# Patient Record
Sex: Male | Born: 1935 | Race: White | Hispanic: No | Smoking: Former smoker
Health system: Southern US, Community
[De-identification: ages and names within clinical notes are randomized; demographics above are authoritative.]

## PROBLEM LIST (undated history)

## (undated) DIAGNOSIS — M5442 Lumbago with sciatica, left side: Secondary | ICD-10-CM

## (undated) DIAGNOSIS — I82890 Acute embolism and thrombosis of other specified veins: Secondary | ICD-10-CM

## (undated) DIAGNOSIS — K219 Gastro-esophageal reflux disease without esophagitis: Secondary | ICD-10-CM

## (undated) DIAGNOSIS — I639 Cerebral infarction, unspecified: Secondary | ICD-10-CM

## (undated) DIAGNOSIS — C4491 Basal cell carcinoma of skin, unspecified: Secondary | ICD-10-CM

## (undated) DIAGNOSIS — I499 Cardiac arrhythmia, unspecified: Secondary | ICD-10-CM

## (undated) DIAGNOSIS — R918 Other nonspecific abnormal finding of lung field: Secondary | ICD-10-CM

## (undated) DIAGNOSIS — Z87442 Personal history of urinary calculi: Secondary | ICD-10-CM

## (undated) DIAGNOSIS — E785 Hyperlipidemia, unspecified: Secondary | ICD-10-CM

## (undated) DIAGNOSIS — N189 Chronic kidney disease, unspecified: Secondary | ICD-10-CM

## (undated) DIAGNOSIS — N400 Enlarged prostate without lower urinary tract symptoms: Secondary | ICD-10-CM

## (undated) DIAGNOSIS — I10 Essential (primary) hypertension: Secondary | ICD-10-CM

## (undated) DIAGNOSIS — M1990 Unspecified osteoarthritis, unspecified site: Secondary | ICD-10-CM

## (undated) DIAGNOSIS — I8290 Acute embolism and thrombosis of unspecified vein: Secondary | ICD-10-CM

## (undated) DIAGNOSIS — T7840XA Allergy, unspecified, initial encounter: Secondary | ICD-10-CM

## (undated) HISTORY — DX: Essential (primary) hypertension: I10

## (undated) HISTORY — PX: CATARACT EXTRACTION, BILATERAL: SHX1313

## (undated) HISTORY — PX: CYSTOSCOPY WITH INSERTION OF UROLIFT: SHX6678

## (undated) HISTORY — PX: SPINE SURGERY: SHX786

## (undated) HISTORY — PX: APPENDECTOMY: SHX54

## (undated) HISTORY — PX: KIDNEY STONE SURGERY: SHX686

## (undated) HISTORY — DX: Allergy, unspecified, initial encounter: T78.40XA

## (undated) HISTORY — DX: Other nonspecific abnormal finding of lung field: R91.8

## (undated) HISTORY — DX: Chronic kidney disease, unspecified: N18.9

## (undated) HISTORY — DX: Hyperlipidemia, unspecified: E78.5

## (undated) HISTORY — DX: Gastro-esophageal reflux disease without esophagitis: K21.9

## (undated) HISTORY — DX: Lumbago with sciatica, left side: M54.42

## (undated) HISTORY — PX: KNEE SURGERY: SHX244

## (undated) HISTORY — DX: Basal cell carcinoma of skin, unspecified: C44.91

## (undated) HISTORY — DX: Acute embolism and thrombosis of unspecified vein: I82.90

---
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
## 2005-09-22 ENCOUNTER — Ambulatory Visit: Payer: Self-pay | Admitting: Family Medicine

## 2007-04-13 ENCOUNTER — Encounter: Payer: Self-pay | Admitting: Family Medicine

## 2007-08-10 ENCOUNTER — Ambulatory Visit: Payer: Self-pay | Admitting: Specialist

## 2007-08-17 ENCOUNTER — Inpatient Hospital Stay (HOSPITAL_COMMUNITY): Admission: RE | Admit: 2007-08-17 | Discharge: 2007-08-18 | Payer: Self-pay | Admitting: Neurosurgery

## 2007-12-02 LAB — HM COLONOSCOPY: HM COLON: NORMAL

## 2007-12-23 ENCOUNTER — Ambulatory Visit: Payer: Self-pay | Admitting: Neurosurgery

## 2008-01-03 ENCOUNTER — Inpatient Hospital Stay (HOSPITAL_COMMUNITY): Admission: RE | Admit: 2008-01-03 | Discharge: 2008-01-04 | Payer: Self-pay | Admitting: Neurosurgery

## 2008-05-22 ENCOUNTER — Ambulatory Visit: Payer: Self-pay | Admitting: Family Medicine

## 2008-08-09 ENCOUNTER — Encounter: Payer: Self-pay | Admitting: Family Medicine

## 2008-08-31 ENCOUNTER — Encounter: Payer: Self-pay | Admitting: Family Medicine

## 2009-03-27 ENCOUNTER — Ambulatory Visit: Payer: Self-pay | Admitting: Family Medicine

## 2009-05-28 ENCOUNTER — Ambulatory Visit: Payer: Self-pay | Admitting: Family Medicine

## 2009-10-22 ENCOUNTER — Inpatient Hospital Stay: Payer: Self-pay | Admitting: Student

## 2010-12-12 ENCOUNTER — Ambulatory Visit: Payer: Self-pay | Admitting: Family Medicine

## 2011-04-15 NOTE — Op Note (Signed)
NAMEPHELIX, FUDALA NO.:  [id number]   MEDICAL RECORD NO.:  [id number]          PATIENT TYPE:  INP   LOCATION:  2899                         FACILITY:  [facility]   PHYSICIAN:  Kathaleen Maser. Pool, M.D.    DATE OF BIRTH:  [birth year]   DATE OF PROCEDURE:  08/17/2007  DATE OF DISCHARGE:                               OPERATIVE REPORT   SERVICE:  Neurosurgery.   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   PROCEDURE:  Right L4-5 laminotomy and microdiskectomy.   SURGEON:  Kathaleen Maser. Pool, M.D.   ASSISTANT:  Reinaldo Meeker, M.D.   ANESTHESIA:  General orotracheal.   INDICATION:  Mr. Radell is a 77 year old male with history of back and  bilateral lower extremity pain, failing all conservative management.  Workup demonstrates evidence of a very large central L4-5 disk  herniation.  This disk in biased slightly more towards the right side.  We discussed options for our management including the possibly of  undergoing a right-sided L4-5 laminotomy and microdiskectomy in hopes of  improving his symptoms.  The patient is aware of the risks and benefits  and wishes to proceed with surgery.   OPERATIVE NOTE:  The patient was brought to the operating room and  placed on the operative table in supine position.  After adequate level  of anesthesia was achieved, the patient was positioned prone onto the  Wilson frame and appropriately padded.  The patient's lumbar region was  prepped and sterilely prepped sterilely.  A 10 blade was used to make a  linear skin incision overlying the L4-5 interspace.  This was carried  down sharply in the midline.  A subperiosteal dissection was then  performed, exposing the lamina and facet joints at L4 and L5.  A deep  self-retaining retractor was placed and intraoperative x-rays taken and  the level was  confirmed.  A laminotomy was then performed using a high-  speed drill and Kerrison rongeurs to remove the inferior aspect of the  lamina of L4, medial aspect of the L4-5 facet joint and superior rim of  the L5-1 lamina.  The ligamentum flavum was elevated and resected in  piecemeal fashion using Kerrison rongeurs.  The underlying thecal sac  and exiting L5 nerve were identified.  A microscope was brought into the  operative field for identification of the right-sided L5 nerve root and  microdissection of the underlying disk herniation.  Epidural venous  plexus was coagulated and cut.  Thecal sac and the L5 nerve were gently  mobilized and retracted towards the midline.  Disk herniation was  readily apparent.  This was then incised with a 15 blade in a  rectangular fashion.  A wide disk space clean-out was achieved using  pituitary rongeurs, upbiting rongeurs and Epstein curettes.  All  elements of the disk herniation were completely removed.  All loose  fragments of degenerative disk material was removed from the interspace.  At this point, a very thorough diskectomy had  been performed.  There was  no evidence of injury to the thecal sac or nerve roots.  A blunt probe  was passed easily throughout the spinal canal without evidence of any  residual compression.  There was no evidence of injury to the thecal sac  or nerve roots.  The wound was then irrigated with antibiotic solution.  Gelfoam was placed topically for hemostasis, which was found to be good.  Microscope and retractor system were removed.  Hemostasis in the muscle was achieved with electrocautery.  The wound  was then closed in layers with Vicryl suture.  Steri-Strips and sterile  dressing were applied.  There were no apparent complications.  The  patient tolerated the procedure well and he returns to the recovery room  postoperatively.           ______________________________  Kathaleen Maser Pool, M.D.     HAP/MEDQ  D:   08/17/2007  T:  08/18/2007  Job:  [id number]

## 2011-04-15 NOTE — Op Note (Signed)
NAMEKIEON, LAWHORN NO.:  [id number]   MEDICAL RECORD NO.:  [id number]          PATIENT TYPE:  OIB   LOCATION:  3039                         FACILITY:  [facility]   PHYSICIAN:  Kathaleen Maser. Pool, M.D.    DATE OF BIRTH:  Aug 06, 1926   DATE OF PROCEDURE:  01/03/2008  DATE OF DISCHARGE:  01/04/2008                               OPERATIVE REPORT   PREOPERATIVE DIAGNOSES:  Recurrent central L4-5 herniated nucleus  pulposus with radiculopathy.   POSTOPERATIVE DIAGNOSES:  Recurrent central L4-5 herniated nucleus  pulposus with radiculopathy.   OPERATION PERFORMED:  Re-exploration of L4-5 laminotomy with left-sided  L4-5 microdiskectomy.   SURGEON:  Kathaleen Maser. Pool, M.D.   ASSISTANT:  Donalee Citrin, M.D.   ANESTHESIA:  General endotracheal.   INDICATIONS FOR PROCEDURE:  Mr. Gorter is a 77 year old male status post  previous right-sided L4-5 laminotomy and microdiskectomy. He presents  now with worsening back and left lower extremity pain.  Work-up  demonstrates evidence of a large left paracentral recurrent disk  herniation at L4-5.  Patient has been counseled as to his options.  He  has decided to proceed with a left-sided L4-5 redo laminotomy and  microdiskectomy in hopes of improving symptoms.   DESCRIPTION OF PROCEDURE:  The patient was taken to the operating room  and placed on the table in the supine position.  After adequate level of  anesthesia was achieved, the patient was positioned prone onto a Wilson  frame and appropriately padded.  The patient's lumbar region was prepped  and draped sterilely.  A 10 blade was used to make a linear skin  incision overlying the L4-5 interspace.  This was carried down sharply  in the midline.  A subperiosteal dissection was then performed exposing  the lamina and facet joints of L4 and L4 on the left side.  Deep self-  retaining retractor was placed.  Intraoperative x-ray was taken, the  level was confirmed.  The laminotomy was then  performed using high speed  drill and Kerrison rongeurs to remove the inferior edge of the lamina at  L4, medial aspect of the L4-5 facet joint and the superior rim at L5  __________  ligamentum flavum was then  elevated and resected in  piecemeal fashion using Kerrison rongeurs.  Underlying thecal sac and  exiting L5 nerve root were identified.  The microscope was brought into  the field and used for microdissection of the left-sided L5 nerve root  and underlying disk herniation.  Epidural venous plexus was coagulated  and cut.  Thecal sac and L5 nerve root were gently mobilized, retracted  toward the midline.  Disk space and disk herniation were apparent.  They  were then incised with a 15 blade in rectangular fashion.  A wide disk  space cleanout was then achieved pituitary rongeurs and upward angled  pituitary rongeurs and Epstein curets.  All elements of the recurrent  disk herniation completely resection.  All loose or obviously  degenerated disk material was removed from the interspace.  Afterwards a  very thorough diskectomy had been performed.  The wound  was then  irrigated with antibiotic solution.  Gelfoam was placed topically for  hemostasis which was found to be good.  There was no evidence of injury  to thecal sac or nerve roots.  There was no evidence of any residual  compression upon the thecal sac and nerve roots.  Gelfoam placed  topically for hemostasis which was found to be good.  The wound was then  in typical fashion.  Steri-Strips and sterile dressing were applied.  There were no apparent complications.  The patient tolerated the  procedure well and returned to the recovery room postoperatively.           ______________________________  Kathaleen Maser Pool, M.D.     HAP/MEDQ  D:  01/03/2008  T:  01/04/2008  Job:  [id number]

## 2011-08-22 LAB — CBC
HCT: 49.8
MCV: 91
Platelets: 261
RDW: 12.7

## 2011-08-22 LAB — DIFFERENTIAL
Basophils Absolute: 0
Eosinophils Absolute: 0.4
Eosinophils Relative: 4
Lymphs Abs: 2.3

## 2011-08-22 LAB — BASIC METABOLIC PANEL
BUN: 14
Chloride: 107
Glucose, Bld: 107 — ABNORMAL HIGH
Potassium: 4.2

## 2011-08-22 LAB — TYPE AND SCREEN: ABO/RH(D): O POS

## 2011-09-11 LAB — TYPE AND SCREEN: Antibody Screen: NEGATIVE

## 2011-09-11 LAB — BASIC METABOLIC PANEL
CO2: 23
Calcium: 9.4
Glucose, Bld: 121 — ABNORMAL HIGH
Sodium: 138

## 2011-09-11 LAB — CBC
Hemoglobin: 16.1
MCHC: 34.8
MCV: 92
RDW: 12.1

## 2012-06-08 ENCOUNTER — Ambulatory Visit: Payer: Self-pay | Admitting: Family Medicine

## 2012-12-10 ENCOUNTER — Ambulatory Visit: Payer: Self-pay | Admitting: Family Medicine

## 2013-11-18 DIAGNOSIS — N200 Calculus of kidney: Secondary | ICD-10-CM | POA: Insufficient documentation

## 2013-11-22 ENCOUNTER — Ambulatory Visit: Payer: Self-pay | Admitting: Family Medicine

## 2013-11-23 ENCOUNTER — Ambulatory Visit: Payer: Self-pay | Admitting: Family Medicine

## 2013-11-26 ENCOUNTER — Inpatient Hospital Stay: Payer: Self-pay | Admitting: Urology

## 2013-11-26 ENCOUNTER — Ambulatory Visit: Payer: Self-pay | Admitting: Urology

## 2013-11-26 LAB — CBC WITH DIFFERENTIAL/PLATELET
Basophil #: 0.1 10*3/uL (ref 0.0–0.1)
Basophil %: 0.7 %
Eosinophil #: 0.4 10*3/uL (ref 0.0–0.7)
Eosinophil #: 0.1 10*3/uL (ref 0.0–0.7)
Eosinophil %: 3.9 %
Eosinophil %: 1.5 %
HCT: 44.7 % (ref 40.0–52.0)
HGB: 15.2 g/dL (ref 13.0–18.0)
Lymphocyte #: 1.4 10*3/uL (ref 1.0–3.6)
Lymphocyte %: 19.6 %
MCH: 31.3 pg (ref 26.0–34.0)
MCH: 31 pg (ref 26.0–34.0)
MCHC: 33.9 g/dL (ref 32.0–36.0)
MCV: 92 fL (ref 80–100)
MCV: 91 fL (ref 80–100)
Monocyte #: 1.2 x10 3/mm — ABNORMAL HIGH (ref 0.2–1.0)
Monocyte #: 0.9 x10 3/mm (ref 0.2–1.0)
Monocyte %: 11.2 %
Monocyte %: 9.3 %
Neutrophil #: 6.7 10*3/uL — ABNORMAL HIGH (ref 1.4–6.5)
Platelet: 260 10*3/uL (ref 150–440)
RBC: 4.85 10*6/uL (ref 4.40–5.90)
RDW: 13.3 % (ref 11.5–14.5)
RDW: 13.3 % (ref 11.5–14.5)
WBC: 10.3 10*3/uL (ref 3.8–10.6)

## 2013-11-26 LAB — COMPREHENSIVE METABOLIC PANEL
Albumin: 3.3 g/dL — ABNORMAL LOW (ref 3.4–5.0)
Bilirubin,Total: 0.5 mg/dL (ref 0.2–1.0)
Calcium, Total: 8.8 mg/dL (ref 8.5–10.1)
Chloride: 110 mmol/L — ABNORMAL HIGH (ref 98–107)
Co2: 23 mmol/L (ref 21–32)
Creatinine: 2.56 mg/dL — ABNORMAL HIGH (ref 0.60–1.30)
EGFR (Non-African Amer.): 22 — ABNORMAL LOW
Osmolality: 290 (ref 275–301)
Total Protein: 7.2 g/dL (ref 6.4–8.2)

## 2013-11-26 LAB — BASIC METABOLIC PANEL
BUN: 37 mg/dL — ABNORMAL HIGH (ref 7–18)
Calcium, Total: 8.7 mg/dL (ref 8.5–10.1)
Co2: 24 mmol/L (ref 21–32)
Creatinine: 1.95 mg/dL — ABNORMAL HIGH (ref 0.60–1.30)
EGFR (African American): 35 — ABNORMAL LOW
EGFR (Non-African Amer.): 30 — ABNORMAL LOW
Glucose: 110 mg/dL — ABNORMAL HIGH (ref 65–99)
Sodium: 140 mmol/L (ref 136–145)

## 2013-11-26 LAB — URINALYSIS, COMPLETE
Bacteria: NONE SEEN
Bilirubin,UR: NEGATIVE
Glucose,UR: NEGATIVE mg/dL (ref 0–75)
Leukocyte Esterase: NEGATIVE
Squamous Epithelial: 1

## 2013-11-26 LAB — LIPASE, BLOOD: Lipase: 516 U/L — ABNORMAL HIGH (ref 73–393)

## 2013-11-27 LAB — BASIC METABOLIC PANEL
Calcium, Total: 8.2 mg/dL — ABNORMAL LOW (ref 8.5–10.1)
Co2: 23 mmol/L (ref 21–32)
EGFR (African American): 60 — ABNORMAL LOW
Glucose: 101 mg/dL — ABNORMAL HIGH (ref 65–99)

## 2013-11-30 ENCOUNTER — Ambulatory Visit: Payer: Self-pay | Admitting: Urology

## 2013-11-30 DIAGNOSIS — I10 Essential (primary) hypertension: Secondary | ICD-10-CM

## 2013-12-05 ENCOUNTER — Ambulatory Visit: Payer: Self-pay | Admitting: Urology

## 2014-01-02 ENCOUNTER — Ambulatory Visit: Payer: Self-pay | Admitting: Family Medicine

## 2014-05-31 LAB — PSA: PSA: NORMAL

## 2014-10-19 LAB — LIPID PANEL
Cholesterol: 206 mg/dL — AB (ref 0–200)
HDL: 30 mg/dL — AB (ref 35–70)
LDL Cholesterol: 116 mg/dL

## 2015-01-25 ENCOUNTER — Ambulatory Visit: Payer: Self-pay | Admitting: Family Medicine

## 2015-03-23 NOTE — Discharge Summary (Signed)
PATIENT NAME:  Jose Castro, Jose Castro MR#:  [id number] DATE OF BIRTH:  Sep 12, 1926  DATE OF ADMISSION:  11/26/2013 DATE OF DISCHARGE:  11/27/2013  ADMITTING DIAGNOSES: 1.  Left ureteral stone. 2.  Mild left hydronephrosis.   3.  Acute kidney injury.    DISCHARGE DIAGNOSES: 1.  Left ureteral stone. 2.  Mild left hydronephrosis.   3.  Acute kidney injury.    SECONDARY DIAGNOSES: 1.  Hypertension. 2.  Spinal stenosis.  PROCEDURES PERFORMED:  Cystoscopy with left retrograde pyelogram with left 6-French x 26 cm ureteral stent placement.    CONSULTATIONS:  None.    HISTORY OF PRESENT ILLNESS:  Please refer to dictated history and physical.    HOSPITAL COURSE:  The patient was admitted to the urology service for acute kidney injury with a creatinine up to 2.5 and a left proximal ureteral stone with mild hydronephrosis.  The patient was placed on IV fluids and was taken to the Operating Room on December 27th for cystoscopy with left ureteral stent placement.  Retrograde pyelogram revealed a left proximal ureteral stone consistent with the CT scan and mild hydronephrosis.  Stent was placed without difficulty.  By the next day, the patient's creatinine had come down to 1.2.  He had improvement in his pain.  His Foley catheter which had been placed during the operation was removed and he voided without difficulty.  At the time of his discharge, he was tolerating a regular diet, ambulating at his preadmission status and voiding at his preadmission status.    DISCHARGE MEDICATIONS:   1.  Tamsulosin 0.4 mg p.o. every day. 2.  Hydrocodone/acetaminophen 5/325 one tab q.6 hours p.r.n. pain.  DISCHARGE INSTRUCTIONS:   1.  Return to the Emergency Room or call the doctor on call if you have fever greater than 101.5, worsening pain, nausea, vomiting, inability to urinate or any other concerning signs or symptoms.   2.  Please follow up with Dr. Eliberto Ivory by calling his office tomorrow, Monday.   3.  Please hold home  meloxicam in anticipation of future surgery.   4.  Some blood in the urine is normal.  As long as you feel like your are emptying your bladder okay and it is not profusely bleeding, you can monitor the situation.    FOLLOWUP PLANS: 1.  The patient will call Dr. Eliberto Ivory tomorrow, Monday, for followup.   2.  I recommended that the patient follow up with his primary care physician given some elevated blood pressure while in-house as well as some rectal bleeding that came after his enema.  His hemoglobin was within normal limits with limited concern for malignancy but I did discuss this risk with the patient and recommended he follow up with his PCP for further discussion.    ____________________________ Charna Archer, MD jps:cs D: 11/27/2013 12:04:00 ET T: 11/27/2013 20:51:05 ET JOB#: [id number]  cc: Charna Archer, MD, <Dictator> Elwyn Lade [facility] MD ELECTRONICALLY SIGNED 11/28/2013 3:14

## 2015-03-23 NOTE — Op Note (Signed)
PATIENT NAME:  Jose Castro, Jose Castro MR#:  [id number] DATE OF BIRTH:  Apr 24, 1926  DATE OF PROCEDURE:  11/26/2013  PREOPERATIVE DIAGNOSES: 1.  Acute kidney injury. 2.  Left ureteral stone. 3.  Left mild hydronephrosis.  POSTOPERATIVE DIAGNOSES: 1.  Acute kidney injury. 2.  Left ureteral stone. 3.  Left mild hydronephrosis.  PROCEDURES PERFORMED: 1.  Cystoscopy with left 6 French x 26 cm contour ureteral stent placement. 2.  Left retrograde pyelogram. 3.  Interpretation of urography. 4.  Intraoperative fluoroscopy with total time of less than 1 hour.   ATTENDING SURGEON:  Charna Archer, M.D.   ASSISTANTS: None.  ANESTHESIA: LMA.  SPECIMENS: None.  COMPLICATIONS: None.  DRAINS:  Ureteral stent as above and 18 French Foley catheter draining the bladder.  ESTIMATED BLOOD LOSS:  Minimal.  FINDINGS:  There was mild left hydronephrosis noted with a filling defect consistent with a stone noted at the junction of the mid and proximal ureter on the left. A 6 x 26 contour ureteral stent was placed without difficulty up in the bladder. He did have a moderately trabeculated bladder with cellules throughout. He also had some mild hematuria from prostatic source.  INDICATIONS FOR SURGERY:  Mr. Frick is a pleasant 81 year old male with a history of acute kidney injury and a left ureteral stone. After discussing the risks and benefits of the operation, the patient elected to proceed to the OR for cystoscopy with left retrograde pyelogram and possible stent placement.  DESCRIPTION OF PROCEDURE: After the patient was clearly identified and preop was performed, he was brought to the operating room and placed supine on the operating room table. A procedure time out was called. Preoperative Levaquin was administered and general anesthesia with LMA was then induced. The patient was then placed in the dorsal lithotomy position taking care to pad all pressure points. He was then sterilely prepped and draped in the  usual fashion followed by a presentation time out. With ample lubrication and saline running, a 22. 5 French rigid cystoscope was advanced per urethra into the bladder. In the anterior urethra, I noted no abnormalities. The prostatic urethra had some prominent vessels and there was some mild bleeding here.  The scope was passed in the bladder which was moderately trabeculated and had cellules throughout. It was rather distended as well. I identified the left ureteral orifice and cannulated this with a sensor wire. The open-ended catheter was then passed over this and a retrograde pyelogram was shot which noted mild left hydronephrosis with a stone present, has a filling defect at the junction and mid and proximal left ureter. The wire was then replaced and a 6 French x 26 cm contour ureteral stent was placed over the wire and up into the kidney. Good curl was noted in the kidney on fluoroscopy and under direct visualization in the bladder. The bladder was then drained and an 63 French Foley catheter of the Coude type was placed without difficulty. The patient was then awoken from anesthesia without complication and transferred to recovery room for postoperative care.    ____________________________ Charna Archer, MD jps:NTS D: 11/26/2013 19:29:00 ET T: 11/27/2013 04:33:11 ET JOB#: [id number]  cc: Charna Archer, MD, <Dictator> Elwyn Lade [facility] MD ELECTRONICALLY SIGNED 11/28/2013 3:13

## 2015-03-23 NOTE — H&P (Signed)
Subjective/Chief Complaint kidney stone   History of Present Illness 81 y/o M with h/o left ureteral stone and acute kidney injury seen in the emergency department for evaluation and recommendations regarding this concern. For the last week he has had poor PO intake and n/v - every time he ate food he felt nauseated. Because of this he went to his PCP who checked a u/a and noted blood and protein on the sample. He then got a CT on 12/24 and a 101m proximal left ureteral stone with hydro was noted. He continued to feel poorly, but he attributes this mostly to constipation. He actually came in to the ER because he had some hematochezia after he used a Fleets enema. When his labs were checked his Cr was 2.5. He has mild flank pain. no fevers, chills. + nausea. no hematuria or dysuria.   Past History BPH hypertension on a HRadiographer, therapeutictrial kidney stones spinal stenosis   Past Medical Health Hypertension   Past Med/Surgical Hx:  renal calculi:   Hypertension:   Hemorrhoidectomy:   Appendectomy:   ALLERGIES:  Penicillin: Swelling   Medications Finasteride Lisinopril Vitamin D Meloxicam Unknown liver pill as part of [employer] study   Family and Social History:  Family History Non-Contributory   Social History negative tobacco, negative Illicit drugs   Place of Living Home   Review of Systems:  Fever/Chills No   Cough No   Sputum No   Abdominal Pain Yes   Diarrhea No   Constipation Yes   Nausea/Vomiting Yes   SOB/DOE No   Chest Pain No   Dysuria No   Medications/Allergies Reviewed Medications/Allergies reviewed   Physical Exam:  GEN well developed, no acute distress   HEENT dry oral mucosa   NECK supple  No masses   RESP normal resp effort  clear BS   CARD regular rate   ABD positive Flank Tenderness  no liver/spleen enlargement  normal BS  left CVAT, palpable bladder   GU uncirc'ed penis with no lesions, testicles desc b/l with no masses    EXTR negative cyanosis/clubbing   SKIN No rashes   NEURO cranial nerves intact   PSYCH alert, A+O to time, place, person, good insight   Additional Comments Ct personally reviewed - proximal left ureteral stone with mild hydro, likely some medical renal disease   Lab Results: Hepatic:  27-Dec-14 02:39   Bilirubin, Total 0.5  Alkaline Phosphatase 68 (45-117 NOTE: New Reference Range 10/21/13)  SGPT (ALT) 18  SGOT (AST) 21  Total Protein, Serum 7.2  Albumin, Serum  3.3  Routine Chem:  27-Dec-14 02:39   Glucose, Serum  120  BUN  43  Creatinine (comp)  2.56  Sodium, Serum 139  Potassium, Serum 4.4  Chloride, Serum  110  CO2, Serum 23  Calcium (Total), Serum 8.8  Osmolality (calc) 290  eGFR (African American)  25  eGFR (Non-African American)  22 (eGFR values <614mmin/1.73 m2 may be an indication of chronic kidney disease (CKD). Calculated eGFR is useful in patients with stable renal function. The eGFR calculation will not be reliable in acutely ill patients when serum creatinine is changing rapidly. It is not useful in  patients on dialysis. The eGFR calculation may not be applicable to patients at the low and high extremes of body sizes, pregnant women, and vegetarians.)  Anion Gap  6  Lipase  516 (Result(s) reported on 26 Nov 2013 at 03:09AM.)  Routine UA:  27-Dec-14 02:39   Color (  UA) Yellow  Clarity (UA) Clear  Glucose (UA) Negative  Bilirubin (UA) Negative  Ketones (UA) Negative  Specific Gravity (UA) 1.023  Blood (UA) Negative  pH (UA) 5.0  Protein (UA) 30 mg/dL  Nitrite (UA) Negative  Leukocyte Esterase (UA) Negative (Result(s) reported on 26 Nov 2013 at 03:13AM.)  RBC (UA) 6 /HPF  WBC (UA) 2 /HPF  Bacteria (UA) NONE SEEN  Epithelial Cells (UA) 1 /HPF  Mucous (UA) PRESENT  Hyaline Cast (UA) 1 /LPF (Result(s) reported on 26 Nov 2013 at 03:13AM.)  Routine Hem:  27-Dec-14 02:39   WBC (CBC) 10.3  RBC (CBC) 4.85  Hemoglobin (CBC) 15.2  Hematocrit  (CBC) 44.7  Platelet Count (CBC) 269  MCV 92  MCH 31.3  MCHC 33.9  RDW 13.3  Neutrophil % 64.7  Lymphocyte % 19.6  Monocyte % 11.2  Eosinophil % 3.9  Basophil % 0.6  Neutrophil #  6.7  Lymphocyte # 2.0  Monocyte #  1.2  Eosinophil # 0.4  Basophil # 0.1 (Result(s) reported on 26 Nov 2013 at 03:03AM.)   Radiology Results: LabUnknown:    24-Dec-14 12:10, CT Abdomen Pelvis WO for Stone  PACS Image  CT:  CT Abdomen Pelvis WO for Stone  REASON FOR EXAM:    left flank pain hematuria decreased creatinine eval   for kidney stones  COMMENTS:       PROCEDURE: KCT - KCT ABDOMEN/PELVIS WO (STONE)  - Nov 23 2013 12:10PM     CLINICAL DATA:  Left-sided flank pain    EXAM:  CT ABDOMEN AND PELVIS WITHOUT CONTRAST    TECHNIQUE:  Multidetector CT imaging of the abdomen and pelvis was performed  following the standard protocol without IV contrast.  COMPARISON:  None.    FINDINGS:  Lung bases are free of acute infiltrate or sizable effusion.    The gallbladder is well distended and demonstrates some small tiny  dependent gallstones. The liver and spleen as well as the adrenal  glands and pancreas are within normal limits. The kidneys are well  visualized bilaterally and demonstrate perinephric stranding  bilaterally. Multiple simple and hyperdense cysts are noted within  both kidneys. Mild hydronephrosis is noted on the left secondary to  a 7.5 mm stone in the proximal left ureter. The more distal left  ureter is within normal limits. The right renal collecting system in  ureter are unremarkable. No right-sided calculi are seen. The  appendix is not visualized consistent with prior surgical history.  The bladder is well distended with non-opacified urine. The prostate  is enlarged. Diffuse diverticular change of the colon without  diverticulitis is seen.     IMPRESSION:  Diverticulosis without diverticulitis.    Proximal left ureteral stone with obstructive  change.    Cholelithiasis.    No other focal acute abnormality is noted.      Electronically Signed    By: Inez Catalina M.D.    On: 11/23/2013 12:12         Verified By: Everlene Farrier, M.D.,    Assessment/Admission Diagnosis 81 y/o M with AKI and left ureteral stone. AKI likely multifactorial from dehydration, n/v, NSAIDs, and ureteral stone   Plan - admit to Urology - OR for left ureteral stent placement and RPG - IVF at 125, NPO for now - finasteride as home med, will add tamsulosin in small event that stone passes before OR, hold home ACEI and NSAIDs - dispo: floor   Electronic Signatures: Charna Archer (MD)  (  Signed 27-Dec-14 09:02)  Authored: CHIEF COMPLAINT and HISTORY, PAST MEDICAL/SURGIAL HISTORY, ALLERGIES, OTHER MEDICATIONS, FAMILY AND SOCIAL HISTORY, REVIEW OF SYSTEMS, PHYSICAL EXAM, LABS, Radiology, ASSESSMENT AND PLAN   Last Updated: 27-Dec-14 09:02 by Charna Archer (MD)

## 2015-03-24 NOTE — Op Note (Signed)
PATIENT NAME:  Jose Castro, CAPOZZOLI MR#:  [id number] DATE OF BIRTH:  Dec 04, 1925  DATE OF PROCEDURE:  12/05/2013  PREOPERATIVE DIAGNOSIS: Left ureterolithiasis.   POSTOPERATIVE DIAGNOSIS: Left  ureterolithiasis.   PROCEDURES: 1. Left ureteroscopic ureterolithotomy with holmium laser lithotripsy.  2. Cystoscopy with stent removal.  3. Fluoroscopy.  4. Local anesthesia with intraurethral Anestacon.   INDICATIONS: See the dictated history and physical. After informed consent, the patient requests the above procedures.   OPERATIVE SUMMARY: After adequate general anesthesia had been obtained, the patient was placed into dorsal lithotomy position and the perineum was prepped and draped in the usual fashion. Fluoroscopy confirmed presence of a left ureteral stent. A definite stone could not be identified with fluoroscopy. At this point, the 48 French cystoscope was coupled with the camera and then visually advanced into the bladder. The bladder was thoroughly inspected. Both orifices were identified. A stent was protruding from the left orifice. No bladder tumors were identified. The patient had lateral lobe prostatic hypertrophy. At this point, using an alligator grasping forceps, the ureteral stent was removed. A 0.035 Glidewire was then advanced up the left ureter under fluoroscopic guidance. The Stortz mini ureteroscope was then coupled with the camera and then advanced up the ureter. A 10 mm stone was encountered at the mid ureter. The 300 micron holmium laser fiber was introduced through the scope and passed to the level of stone. The stone was fully disintegrated and fragments less than 1 mm each. At this point, the ureteroscope was removed. Guidewire was removed, 10 mL of viscous Xylocaine was instilled within the urethra and the bladder. The procedure was then terminated and the patient was transferred to the recovery room in stable condition.   ____________________________ Otelia Limes. Yves Dill,  MD mrw:sg D: 12/05/2013 08:33:17 ET T: 12/05/2013 09:25:53 ET JOB#: [id number]  cc: Otelia Limes. Yves Dill, MD, <Dictator> Royston Cowper MD ELECTRONICALLY SIGNED 12/05/2013 12:52

## 2015-03-24 NOTE — H&P (Signed)
PATIENT NAME:  Jose Castro, Jose Castro MR#:  [id number] DATE OF BIRTH:  12-29-1925  DATE OF ADMISSION:  12/05/2013  CHIEF COMPLAINT: Left ureterolithiasis.    HISTORY OF PRESENT ILLNESS: Mr. Zaremba is an 81 year old white male who presented to the Emergency Room December the 27th with acute left renal colic due to a 7 mm obstructing left proximal stone. He was also noted to have an elevated creatinine of 2.5. He underwent emergent cystoscopy with left stent placement at that time. He comes in now for left ureteroscopic ureterolithotomy with holmium laser lithotripsy.   ALLERGIES: PENICILLIN.   CURRENT MEDICATIONS: Include: Finasteride, lisinopril, meloxicam, pantoprazole and tamsulosin.   PAST SURGICAL HISTORY: 1. Appendectomy with tonsillectomy in 1937.  2. Left groin lipoma excision 1987.  3. Hemorrhoidectomy.   PAST AND CURRENT MEDICAL CONDITIONS:  1. Hypertension.  2. Spinal stenosis.  3. BPH.  4. GERD. 5. Arthritis.   REVIEW OF SYSTEMS: The patient denied chest pain, shortness of breath, heart disease, diabetes, stroke or lung disease.   PHYSICAL EXAMINATION: GENERAL: Well-nourished white male in no distress.  HEENT: Sclerae were clear. Pupils were equally round, reactive to light and accommodation. Extraocular movements were intact.   NECK: Supple. No palpable cervical adenopathy.  LUNGS: Clear to auscultation.  CARDIOVASCULAR: Regular rhythm and rate without audible murmurs.  ABDOMEN: Soft, nontender abdomen.  GENITOURINARY AND RECTAL: Deferred.  NEUROMUSCULAR: Alert and oriented x 3.   IMPRESSION: Left ureterolithiasis status post stent placement.   PLAN: Left ureteroscopic ureterolithotomy with holmium laser lithotripsy.     ____________________________ Otelia Limes. Yves Dill, MD mrw:sg D: 11/29/2013 12:58:36 ET T: 11/29/2013 13:13:11 ET JOB#: [id number]  cc: Otelia Limes. Yves Dill, MD, <Dictator>  Royston Cowper MD ELECTRONICALLY SIGNED 12/05/2013 12:52

## 2015-06-21 ENCOUNTER — Encounter: Payer: Self-pay | Admitting: Family Medicine

## 2015-06-21 ENCOUNTER — Ambulatory Visit (INDEPENDENT_AMBULATORY_CARE_PROVIDER_SITE_OTHER): Payer: Medicare Other | Admitting: Family Medicine

## 2015-06-21 VITALS — BP 132/84 | HR 68 | Temp 98.4°F | Resp 16 | Ht 70.0 in | Wt 205.2 lb

## 2015-06-21 DIAGNOSIS — K219 Gastro-esophageal reflux disease without esophagitis: Secondary | ICD-10-CM | POA: Insufficient documentation

## 2015-06-21 DIAGNOSIS — K2100 Gastro-esophageal reflux disease with esophagitis, without bleeding: Secondary | ICD-10-CM

## 2015-06-21 DIAGNOSIS — I10 Essential (primary) hypertension: Secondary | ICD-10-CM | POA: Diagnosis not present

## 2015-06-21 DIAGNOSIS — E785 Hyperlipidemia, unspecified: Secondary | ICD-10-CM | POA: Diagnosis not present

## 2015-06-21 DIAGNOSIS — M5442 Lumbago with sciatica, left side: Secondary | ICD-10-CM

## 2015-06-21 NOTE — Patient Instructions (Signed)
No change in regimen otherwise

## 2015-06-21 NOTE — Progress Notes (Signed)
Name: Jose Castro   MRN: [id number]    DOB: October 09, 1926   Date:06/21/2015       Progress Note  Subjective  Chief Complaint  Chief Complaint  Patient presents with  . Hypertension  . Gastrophageal Reflux    Hypertension This is a chronic problem. The current episode started more than 1 year ago. The problem is unchanged. The problem is controlled. Pertinent negatives include no anxiety, blurred vision, chest pain, headaches, neck pain, orthopnea, palpitations or shortness of breath. There are no associated agents to hypertension. Risk factors for coronary artery disease include obesity and sedentary lifestyle. Past treatments include ACE inhibitors. The current treatment provides moderate improvement.  Gastrophageal Reflux He complains of heartburn. He reports no chest pain, no coughing, no nausea or no sore throat. This is a recurrent problem. The current episode started more than 1 year ago. The problem occurs occasionally. The problem has been unchanged. Pertinent negatives include no weight loss. Risk factors include obesity. The treatment provided moderate relief.  Back Pain This is a chronic problem. The current episode started more than 1 year ago. The problem occurs intermittently. The problem is unchanged. The pain is present in the lumbar spine. The quality of the pain is described as stabbing. The pain radiates to the left thigh. The pain is moderate. The symptoms are aggravated by twisting. Pertinent negatives include no chest pain, dysuria, fever, headaches, tingling, weakness or weight loss. He has tried analgesics and walking (History of microdiscectomy) for the symptoms. The treatment provided mild relief.      Past Medical History  Diagnosis Date  . Hypertension   . GERD (gastroesophageal reflux disease)   . Hyperlipidemia     History  Substance Use Topics  . Smoking status: Former Research scientist (life sciences)  . Smokeless tobacco: Not on file  . Alcohol Use: 0.0 oz/week    0 Standard  drinks or equivalent per week     Current outpatient prescriptions:  .  brimonidine (ALPHAGAN) 0.2 % ophthalmic solution, , Disp: , Rfl:  .  Calcium Carbonate-Vitamin D (CALCIUM-VITAMIN D) 500-200 MG-UNIT per tablet, Take 1 tablet by mouth daily., Disp: , Rfl:  .  CIALIS 5 MG tablet, , Disp: , Rfl:  .  finasteride (PROSCAR) 5 MG tablet, , Disp: , Rfl:  .  lisinopril (PRINIVIL,ZESTRIL) 20 MG tablet, , Disp: , Rfl:  .  pantoprazole (PROTONIX) 40 MG tablet, , Disp: , Rfl:   No Known Allergies  Review of Systems  Constitutional: Negative for fever, chills and weight loss.  HENT: Negative for congestion, hearing loss, sore throat and tinnitus.   Eyes: Negative for blurred vision, double vision and redness.  Respiratory: Negative for cough, hemoptysis and shortness of breath.   Cardiovascular: Negative for chest pain, palpitations, orthopnea, claudication and leg swelling.  Gastrointestinal: Positive for heartburn. Negative for nausea, vomiting, diarrhea, constipation and blood in stool.  Genitourinary: Negative for dysuria, urgency, frequency and hematuria.  Musculoskeletal: Positive for back pain. Negative for myalgias, joint pain, falls and neck pain.  Skin: Negative for itching.  Neurological: Negative for dizziness, tingling, tremors, focal weakness, seizures, loss of consciousness, weakness and headaches.  Endo/Heme/Allergies: Does not bruise/bleed easily.  Psychiatric/Behavioral: Negative for depression and substance abuse. The patient is not nervous/anxious and does not have insomnia.      Objective  Filed Vitals:   06/21/15 0851  BP: 132/84  Pulse: 68  Temp: 98.4 F (36.9 C)  Resp: 16  Height: '5\' 10"'$  (1.778 m)  Weight: 205  lb 4 oz (93.101 kg)  SpO2: 96%     Physical Exam  Constitutional: He is oriented to person, place, and time and well-developed, well-nourished, and in no distress.  HENT:  Head: Normocephalic.  Eyes: EOM are normal. Pupils are equal, round, and  reactive to light.  Neck: Normal range of motion. Neck supple. No thyromegaly present.  Cardiovascular: Normal rate, regular rhythm and normal heart sounds.   No murmur heard. Pulmonary/Chest: Effort normal and breath sounds normal. No respiratory distress. He has no wheezes.  Abdominal: Soft. Bowel sounds are normal.  Musculoskeletal: Normal range of motion. He exhibits tenderness (LUMBAR AREA GREATER ON THE LEFT THAN RIGHT WITH STRAIGHT LEG RAISING POSITIVE ON THE LEFT AT ABOUT 60.). He exhibits no edema.  Lymphadenopathy:    He has no cervical adenopathy.  Neurological: He is alert and oriented to person, place, and time. No cranial nerve deficit. Gait normal. Coordination normal.  Skin: Skin is warm and dry. No rash noted.  Psychiatric: Affect and judgment normal.      Assessment & Plan  1. Essential hypertension Well-controlled - Comprehensive metabolic panel - TSH  2. Hyperlipemia Labs today - Lipid panel  3. Gastroesophageal reflux disease with esophagitis Stable  4. Left-sided low back pain with left-sided sciatica Stable

## 2015-06-22 LAB — COMPREHENSIVE METABOLIC PANEL
ALBUMIN: 4.3 g/dL (ref 3.5–4.7)
ALT: 10 IU/L (ref 0–44)
AST: 8 IU/L (ref 0–40)
Albumin/Globulin Ratio: 2 (ref 1.1–2.5)
Alkaline Phosphatase: 64 IU/L (ref 39–117)
BILIRUBIN TOTAL: 0.7 mg/dL (ref 0.0–1.2)
BUN/Creatinine Ratio: 17 (ref 10–22)
BUN: 16 mg/dL (ref 8–27)
CALCIUM: 9.3 mg/dL (ref 8.6–10.2)
CO2: 24 mmol/L (ref 18–29)
CREATININE: 0.96 mg/dL (ref 0.76–1.27)
Chloride: 100 mmol/L (ref 97–108)
GFR calc Af Amer: 82 mL/min/{1.73_m2} (ref >59)
GFR calc non Af Amer: 71 mL/min/{1.73_m2} (ref >59)
GLOBULIN, TOTAL: 2.1 g/dL (ref 1.5–4.5)
GLUCOSE: 115 mg/dL — AB (ref 65–99)
POTASSIUM: 4.8 mmol/L (ref 3.5–5.2)
Sodium: 140 mmol/L (ref 134–144)
TOTAL PROTEIN: 6.4 g/dL (ref 6.0–8.5)

## 2015-06-22 LAB — LIPID PANEL
CHOL/HDL RATIO: 6.7 ratio — AB (ref 0.0–5.0)
Cholesterol, Total: 200 mg/dL — ABNORMAL HIGH (ref 100–199)
HDL: 30 mg/dL — AB (ref >39)
LDL CALC: 125 mg/dL — AB (ref 0–99)
Triglycerides: 227 mg/dL — ABNORMAL HIGH (ref 0–149)
VLDL CHOLESTEROL CAL: 45 mg/dL — AB (ref 5–40)

## 2015-06-22 LAB — TSH: TSH: 3.55 u[IU]/mL (ref 0.450–4.500)

## 2015-06-25 ENCOUNTER — Other Ambulatory Visit: Payer: Self-pay

## 2015-06-25 DIAGNOSIS — E785 Hyperlipidemia, unspecified: Secondary | ICD-10-CM

## 2015-06-25 MED ORDER — ATORVASTATIN CALCIUM 20 MG PO TABS: 20.0000 mg | ORAL_TABLET | Freq: Every day | ORAL | Status: DC

## 2015-07-10 ENCOUNTER — Encounter: Payer: Self-pay | Admitting: Family Medicine

## 2015-09-20 ENCOUNTER — Other Ambulatory Visit: Payer: Self-pay | Admitting: Family Medicine

## 2015-10-04 ENCOUNTER — Other Ambulatory Visit: Payer: Self-pay | Admitting: Family Medicine

## 2015-10-04 NOTE — Telephone Encounter (Signed)
Sent to Dr. Ancil Boozer

## 2015-10-22 ENCOUNTER — Ambulatory Visit (INDEPENDENT_AMBULATORY_CARE_PROVIDER_SITE_OTHER): Payer: Medicare Other | Admitting: Family Medicine

## 2015-10-22 ENCOUNTER — Encounter: Payer: Self-pay | Admitting: Family Medicine

## 2015-10-22 VITALS — BP 144/68 | HR 52 | Temp 98.1°F | Resp 16 | Ht 70.0 in | Wt 207.0 lb

## 2015-10-22 DIAGNOSIS — E785 Hyperlipidemia, unspecified: Secondary | ICD-10-CM | POA: Diagnosis not present

## 2015-10-22 DIAGNOSIS — M5442 Lumbago with sciatica, left side: Secondary | ICD-10-CM | POA: Diagnosis not present

## 2015-10-22 DIAGNOSIS — K2100 Gastro-esophageal reflux disease with esophagitis, without bleeding: Secondary | ICD-10-CM

## 2015-10-22 DIAGNOSIS — I10 Essential (primary) hypertension: Secondary | ICD-10-CM

## 2015-10-22 NOTE — Progress Notes (Signed)
Name: Jose Castro   MRN: [id number]    DOB: 1926/09/30   Date:10/22/2015       Progress Note  Subjective  Chief Complaint  Chief Complaint  Patient presents with  . Medication Refill    4 month F/U  . Hyperlipidemia  . Gastroesophageal Reflux    Well controlled, uses for stomach ulcer prevention  . Hypertension    HPI  Hypertension   Patient presents for follow-up of hypertension. It has been present for over 5 years.  Patient states that there is compliance with medical regimen which consists of lisinopril 20 mg daily . There is no end organ disease. Cardiac risk factors include hypertension hyperlipidemia and diabetes.  Exercise regimen consist of golfing and minimal walking .  Diet consist of salt restriction .  Hyperlipidemia  Patient has a history of hyperlipidemia for over 5 years.  Current medical regimen consist of atorvastatin 20 mg daily at bedtime .  Compliance is good .  Diet and exercise are currently followed fairly well for his age .  Risk factors for cardiovascular disease include hyperlipidemia hypertension and age .   There have been no side effects from the medication.    Chronic low back pain  Patient has a long-standing history of chronic lymphatic pain for over 5 years and has had a microdiscectomy of the lumbar area. He continues to use when necessary medications for his discomfort. It is aggravated and yet sometimes relieved by his golf game.  GERD  Patient currently on a regimen of pantoprazole 40 mg daily. He has had GERD for over 5 years. He states that his symptomatology is now well controlled on the pantoprazole.  Past Medical History  Diagnosis Date  . Hypertension   . GERD (gastroesophageal reflux disease)   . Hyperlipidemia     Social History  Substance Use Topics  . Smoking status: Former Smoker -- 16 years    Types: Cigarettes    Start date: 10/21/1944    Quit date: 10/21/1960  . Smokeless tobacco: Never Used  . Alcohol Use: 0.0  oz/week    0 Standard drinks or equivalent per week     Current outpatient prescriptions:  .  atorvastatin (LIPITOR) 20 MG tablet, Take 1 tablet (20 mg total) by mouth daily., Disp: 90 tablet, Rfl: 3 .  brimonidine (ALPHAGAN) 0.2 % ophthalmic solution, , Disp: , Rfl:  .  Calcium Carbonate-Vitamin D (CALCIUM-VITAMIN D) 500-200 MG-UNIT per tablet, Take 1 tablet by mouth daily., Disp: , Rfl:  .  CIALIS 5 MG tablet, , Disp: , Rfl:  .  finasteride (PROSCAR) 5 MG tablet, , Disp: , Rfl:  .  lisinopril (PRINIVIL,ZESTRIL) 20 MG tablet, , Disp: , Rfl:  .  pantoprazole (PROTONIX) 40 MG tablet, TAKE ONE TABLET EVERY DAY, Disp: 90 tablet, Rfl: 0  Allergies  Allergen Reactions  . Penicillins Itching and Swelling    Review of Systems  Constitutional: Negative for fever, chills and weight loss.  HENT: Negative for congestion, hearing loss, sore throat and tinnitus.   Eyes: Negative for blurred vision, double vision and redness.  Respiratory: Negative for cough, hemoptysis and shortness of breath.   Cardiovascular: Positive for leg swelling. Negative for chest pain, palpitations, orthopnea and claudication.  Gastrointestinal: Positive for heartburn. Negative for nausea, vomiting, diarrhea, constipation and blood in stool.  Genitourinary: Negative for dysuria, urgency, frequency and hematuria.  Musculoskeletal: Positive for back pain and joint pain. Negative for myalgias, falls and neck pain.  Skin: Negative for  itching.  Neurological: Negative for dizziness, tingling, tremors, focal weakness, seizures, loss of consciousness, weakness and headaches.  Endo/Heme/Allergies: Does not bruise/bleed easily.  Psychiatric/Behavioral: Negative for depression and substance abuse. The patient is not nervous/anxious and does not have insomnia.      Objective  Filed Vitals:   10/22/15 0846  BP: 144/68  Pulse: 52  Temp: 98.1 F (36.7 C)  TempSrc: Oral  Resp: 16  Height: '5\' 10"'$  (1.778 m)  Weight: 207 lb  (93.895 kg)  SpO2: 96%     Physical Exam  Constitutional: He is oriented to person, place, and time and well-developed, well-nourished, and in no distress.  HENT:  Head: Normocephalic.  Eyes: EOM are normal. Pupils are equal, round, and reactive to light.  Neck: Normal range of motion. Neck supple. No thyromegaly present.  Cardiovascular: Normal rate, regular rhythm and normal heart sounds.   No murmur heard. Pulmonary/Chest: Effort normal and breath sounds normal. No respiratory distress. He has no wheezes.  Abdominal: Soft. Bowel sounds are normal.  Musculoskeletal: Normal range of motion. He exhibits tenderness (lower lumbar area). He exhibits no edema.  Lymphadenopathy:    He has no cervical adenopathy.  Neurological: He is alert and oriented to person, place, and time. No cranial nerve deficit. Gait normal. Coordination normal.  Skin: Skin is warm and dry. No rash noted.  Psychiatric: Judgment normal.  Loquacious and mildly anxious but this is his baseline      Assessment & Plan   1. Essential hypertension At goal 150/90 for age - Comprehensive metabolic panel  2. Gastroesophageal reflux disease with esophagitis Continue PPI  3. Hyperlipemia Labs - Lipid panel - TSH  4. Left-sided low back pain with left-sided sciatica Continue Tylenol when necessary basis intermittently has required Vicodin

## 2015-10-23 LAB — COMPREHENSIVE METABOLIC PANEL
ALBUMIN: 4.2 g/dL (ref 3.5–4.7)
ALT: 10 IU/L (ref 0–44)
AST: 11 IU/L (ref 0–40)
Albumin/Globulin Ratio: 1.8 (ref 1.1–2.5)
Alkaline Phosphatase: 71 IU/L (ref 39–117)
BUN / CREAT RATIO: 18 (ref 10–22)
BUN: 20 mg/dL (ref 8–27)
Bilirubin Total: 0.7 mg/dL (ref 0.0–1.2)
CALCIUM: 9.5 mg/dL (ref 8.6–10.2)
CO2: 21 mmol/L (ref 18–29)
CREATININE: 1.09 mg/dL (ref 0.76–1.27)
Chloride: 105 mmol/L (ref 97–106)
GFR, EST AFRICAN AMERICAN: 70 mL/min/{1.73_m2} (ref >59)
GFR, EST NON AFRICAN AMERICAN: 61 mL/min/{1.73_m2} (ref >59)
GLOBULIN, TOTAL: 2.4 g/dL (ref 1.5–4.5)
Glucose: 110 mg/dL — ABNORMAL HIGH (ref 65–99)
Potassium: 4.9 mmol/L (ref 3.5–5.2)
SODIUM: 143 mmol/L (ref 136–144)
TOTAL PROTEIN: 6.6 g/dL (ref 6.0–8.5)

## 2015-10-23 LAB — LIPID PANEL
CHOL/HDL RATIO: 4.3 ratio (ref 0.0–5.0)
Cholesterol, Total: 158 mg/dL (ref 100–199)
HDL: 37 mg/dL — ABNORMAL LOW (ref >39)
LDL CALC: 87 mg/dL (ref 0–99)
TRIGLYCERIDES: 170 mg/dL — AB (ref 0–149)
VLDL Cholesterol Cal: 34 mg/dL (ref 5–40)

## 2015-10-23 LAB — TSH: TSH: 3.06 u[IU]/mL (ref 0.450–4.500)

## 2015-11-22 ENCOUNTER — Ambulatory Visit (INDEPENDENT_AMBULATORY_CARE_PROVIDER_SITE_OTHER): Payer: Medicare Other | Admitting: Family Medicine

## 2015-11-22 ENCOUNTER — Encounter: Payer: Self-pay | Admitting: Family Medicine

## 2015-11-22 VITALS — BP 162/82 | HR 85 | Temp 99.4°F | Resp 18 | Ht 70.0 in

## 2015-11-22 DIAGNOSIS — K5909 Other constipation: Secondary | ICD-10-CM

## 2015-11-22 DIAGNOSIS — R1084 Generalized abdominal pain: Secondary | ICD-10-CM

## 2015-11-22 DIAGNOSIS — C4491 Basal cell carcinoma of skin, unspecified: Secondary | ICD-10-CM | POA: Insufficient documentation

## 2015-11-22 DIAGNOSIS — I6529 Occlusion and stenosis of unspecified carotid artery: Secondary | ICD-10-CM | POA: Insufficient documentation

## 2015-11-22 DIAGNOSIS — I700 Atherosclerosis of aorta: Secondary | ICD-10-CM | POA: Insufficient documentation

## 2015-11-22 DIAGNOSIS — R739 Hyperglycemia, unspecified: Secondary | ICD-10-CM | POA: Diagnosis not present

## 2015-11-22 DIAGNOSIS — Z23 Encounter for immunization: Secondary | ICD-10-CM

## 2015-11-22 DIAGNOSIS — E739 Lactose intolerance, unspecified: Secondary | ICD-10-CM | POA: Insufficient documentation

## 2015-11-22 DIAGNOSIS — I10 Essential (primary) hypertension: Secondary | ICD-10-CM

## 2015-11-22 DIAGNOSIS — K5730 Diverticulosis of large intestine without perforation or abscess without bleeding: Secondary | ICD-10-CM | POA: Insufficient documentation

## 2015-11-22 DIAGNOSIS — R110 Nausea: Secondary | ICD-10-CM

## 2015-11-22 DIAGNOSIS — R05 Cough: Secondary | ICD-10-CM | POA: Diagnosis not present

## 2015-11-22 DIAGNOSIS — R059 Cough, unspecified: Secondary | ICD-10-CM

## 2015-11-22 DIAGNOSIS — R944 Abnormal results of kidney function studies: Secondary | ICD-10-CM | POA: Insufficient documentation

## 2015-11-22 DIAGNOSIS — M1990 Unspecified osteoarthritis, unspecified site: Secondary | ICD-10-CM | POA: Insufficient documentation

## 2015-11-22 LAB — POC HEMOCCULT BLD/STL (OFFICE/1-CARD/DIAGNOSTIC): FECAL OCCULT BLD: NEGATIVE

## 2015-11-22 MED ORDER — PROMETHAZINE HCL 12.5 MG PO TABS: 6.2500 mg | ORAL_TABLET | Freq: Four times a day (QID) | ORAL | Status: DC | PRN

## 2015-11-22 NOTE — Progress Notes (Signed)
Name: Jose Castro   MRN: [id number]    DOB: Mar 02, 1926   Date:11/22/2015       Progress Note  Subjective  Chief Complaint  Chief Complaint  Patient presents with  . Abdominal Pain    severe pain in stomach and left side, also having dry heaving    HPI  Abdominal pain: he states he had cereal yesterday  with old milk, developed some abdominal pain and after that had a drink of alcohol ( [place] ) with a friend and developed epigastric pain, and nausea about 20 minutes later. He has not had a bowel movement for the past 2 days. Usually has bowel movements at least every other day and good amount.  He has dry heaves, but no vomiting, he has also noticed a mild cough, no rashes, no fever, chills, no blood in stools, no dysuria, no change in urinary frequency. He has noticed a lack of appetite. He has a history of kidney stones - but this pain feels different, also a history of diverticulosis but not diverticulitis   Patient Active Problem List   Diagnosis Date Noted  . Arteriopathy ([place]) 11/22/2015  . Carotid artery narrowing 11/22/2015  . Diverticulosis of colon 11/22/2015  . Decreased creatinine clearance 11/22/2015  . Arthritis, degenerative 11/22/2015  . Lactose intolerance 11/22/2015  . Basal cell carcinoma 11/22/2015  . Left-sided low back pain with left-sided sciatica 06/21/2015  . Essential hypertension 06/21/2015  . Hyperlipemia 06/21/2015  . Gastroesophageal reflux disease with esophagitis 06/21/2015  . Calculus of kidney 11/18/2013  . Pure hypercholesterolemia 03/14/2008    Past Surgical History  Procedure Laterality Date  . Kidney stone surgery    . Appendectomy    . Knee surgery      Family History  Problem Relation Age of Onset  . Cancer Brother   . Heart disease Mother     Social History   Social History  . Marital Status: Widowed    Spouse Name: N/A  . Number of Children: N/A  . Years of Education: N/A   Occupational History  . Not on file.    Social History Main Topics  . Smoking status: Former Smoker -- 16 years    Types: Cigarettes    Start date: 10/21/1944    Quit date: 10/21/1960  . Smokeless tobacco: Never Used  . Alcohol Use: 0.0 oz/week    0 Standard drinks or equivalent per week  . Drug Use: No  . Sexual Activity:    Partners: Female   Other Topics Concern  . Not on file   Social History Narrative     Current outpatient prescriptions:  .  atorvastatin (LIPITOR) 20 MG tablet, Take 1 tablet (20 mg total) by mouth daily., Disp: 90 tablet, Rfl: 3 .  brimonidine (ALPHAGAN) 0.2 % ophthalmic solution, , Disp: , Rfl:  .  Calcium Carbonate-Vitamin D (CALCIUM-VITAMIN D) 500-200 MG-UNIT per tablet, Take 1 tablet by mouth daily., Disp: , Rfl:  .  CIALIS 5 MG tablet, , Disp: , Rfl:  .  finasteride (PROSCAR) 5 MG tablet, , Disp: , Rfl:  .  lisinopril (PRINIVIL,ZESTRIL) 20 MG tablet, , Disp: , Rfl:  .  pantoprazole (PROTONIX) 40 MG tablet, TAKE ONE TABLET EVERY DAY, Disp: 90 tablet, Rfl: 0 .  promethazine (PHENERGAN) 12.5 MG tablet, Take 0.5-1 tablets (6.25-12.5 mg total) by mouth every 6 (six) hours as needed for nausea or vomiting., Disp: 12 tablet, Rfl: 0  Allergies  Allergen Reactions  . Penicillins Itching and  Swelling  . Gabapentin Hives    GI upset.     ROS  Ten systems reviewed and is negative except as mentioned in HPI   Objective  Filed Vitals:   11/22/15 1431  BP: 162/82  Pulse: 85  Temp: 99.4 F (37.4 C)  TempSrc: Oral  Resp: 18  Height: '5\' 10"'$  (1.778 m)  SpO2: 90%    There is no weight on file to calculate BMI.  Physical Exam  Constitutional: Patient appears well-developed and well-nourished. Obese  No distress.  HEENT: head atraumatic, normocephalic, pupils equal and reactive to light,  neck supple, throat within normal limits Cardiovascular: Normal rate, regular rhythm and normal heart sounds.  No murmur heard. No BLE edema. Pulmonary/Chest: Effort normal and breath sounds normal.  No respiratory distress. Abdominal: Soft. Lower abdominal , worse on LLQ. He has a history of kidney stones but it feels different, lots of stools on rectum, negative hemocult Psychiatric: Patient has a normal mood and affect. behavior is normal. Judgment and thought content normal.  Recent Results (from the past 2160 hour(s))  Comprehensive metabolic panel     Status: Abnormal   Collection Time: 10/22/15  9:54 AM  Result Value Ref Range   Glucose 110 (H) 65 - 99 mg/dL   BUN 20 8 - 27 mg/dL   Creatinine, Ser 1.09 0.76 - 1.27 mg/dL   GFR calc non Af Amer 61 >59 mL/min/1.73   GFR calc Af Amer 70 >59 mL/min/1.73   BUN/Creatinine Ratio 18 10 - 22   Sodium 143 136 - 144 mmol/L   Potassium 4.9 3.5 - 5.2 mmol/L   Chloride 105 97 - 106 mmol/L   CO2 21 18 - 29 mmol/L   Calcium 9.5 8.6 - 10.2 mg/dL   Total Protein 6.6 6.0 - 8.5 g/dL   Albumin 4.2 3.5 - 4.7 g/dL   Globulin, Total 2.4 1.5 - 4.5 g/dL   Albumin/Globulin Ratio 1.8 1.1 - 2.5   Bilirubin Total 0.7 0.0 - 1.2 mg/dL   Alkaline Phosphatase 71 39 - 117 IU/L   AST 11 0 - 40 IU/L   ALT 10 0 - 44 IU/L  Lipid panel     Status: Abnormal   Collection Time: 10/22/15  9:54 AM  Result Value Ref Range   Cholesterol, Total 158 100 - 199 mg/dL   Triglycerides 170 (H) 0 - 149 mg/dL   HDL 37 (L) >39 mg/dL   VLDL Cholesterol Cal 34 5 - 40 mg/dL   LDL Calculated 87 0 - 99 mg/dL   Chol/HDL Ratio 4.3 0.0 - 5.0 ratio units    Comment:                                   T. Chol/HDL Ratio                                             Men  Women                               1/2 Avg.Risk  3.4    3.3  Avg.Risk  5.0    4.4                                2X Avg.Risk  9.6    7.1                                3X Avg.Risk 23.4   11.0   TSH     Status: None   Collection Time: 10/22/15  9:54 AM  Result Value Ref Range   TSH 3.060 0.450 - 4.500 uIU/mL  POC Hemoccult Bld/Stl (1-Cd Office Dx)     Status: Normal   Collection  Time: 11/22/15  3:29 PM  Result Value Ref Range   Card #1 Date 11/22/2015    Fecal Occult Blood, POC Negative Negative     PHQ2/9: Depression screen [facility] 2/9 10/22/2015 06/21/2015  Decreased Interest 0 0  Down, Depressed, Hopeless 0 0  PHQ - 2 Score 0 0     Fall Risk: Fall Risk  10/22/2015 06/21/2015  Falls in the past year? No No     Assessment & Plan  1. Generalized abdominal pain  He states it does not feel like kidney stone, he has diverticulosis, it may be diverticulitis, if WBC is high we will start antibiotics - CBC with Differential/Platelet - Basic metabolic panel; Future - POC Hemoccult Bld/Stl (1-Cd Office Dx)   2. Cough  Monitor, states only when gagging  3. Other constipation  He takes Miralax, a lot of stool in the rectum, negative hemoccult, advised fleet enema  4. Needs flu shot  - Flu vaccine HIGH DOSE PF -refused  5. Hyperglycemia  - Hemoglobin A1c  6. Nausea  - promethazine (PHENERGAN) 12.5 MG tablet; Take 0.5-1 tablets (6.25-12.5 mg total) by mouth every 6 (six) hours as needed for nausea or vomiting.  Dispense: 12 tablet; Refill: 0 He will try Fleet enema at home and if blood work shows elevated WBC we will treat for diverticulitis, also advised to call back if blood in urine   7. Uncontrolled hypertension  Per patient usually at goal, he is in pain, we will monitor for now

## 2015-11-23 ENCOUNTER — Other Ambulatory Visit: Payer: Self-pay | Admitting: Family Medicine

## 2015-11-23 DIAGNOSIS — K5732 Diverticulitis of large intestine without perforation or abscess without bleeding: Secondary | ICD-10-CM

## 2015-11-23 LAB — CBC WITH DIFFERENTIAL/PLATELET
BASOS ABS: 0 10*3/uL (ref 0.0–0.2)
Basos: 0 %
EOS (ABSOLUTE): 0 10*3/uL (ref 0.0–0.4)
Eos: 0 %
Hematocrit: 48.8 % (ref 37.5–51.0)
Hemoglobin: 17.1 g/dL (ref 12.6–17.7)
Immature Grans (Abs): 0 10*3/uL (ref 0.0–0.1)
Immature Granulocytes: 0 %
LYMPHS ABS: 2.1 10*3/uL (ref 0.7–3.1)
Lymphs: 13 %
MCH: 30.7 pg (ref 26.6–33.0)
MCHC: 35 g/dL (ref 31.5–35.7)
MCV: 88 fL (ref 79–97)
MONOS ABS: 1.9 10*3/uL — AB (ref 0.1–0.9)
Monocytes: 12 %
NEUTROS ABS: 11.6 10*3/uL — AB (ref 1.4–7.0)
Neutrophils: 75 %
PLATELETS: 166 10*3/uL (ref 150–379)
RBC: 5.57 x10E6/uL (ref 4.14–5.80)
RDW: 13.3 % (ref 12.3–15.4)
WBC: 15.7 10*3/uL — AB (ref 3.4–10.8)

## 2015-11-23 LAB — HEMOGLOBIN A1C
Est. average glucose Bld gHb Est-mCnc: 117 mg/dL
HEMOGLOBIN A1C: 5.7 % — AB (ref 4.8–5.6)

## 2015-11-23 MED ORDER — CIPROFLOXACIN HCL 250 MG PO TABS: 250.0000 mg | ORAL_TABLET | Freq: Two times a day (BID) | ORAL | Status: DC

## 2015-11-23 MED ORDER — METRONIDAZOLE 500 MG PO TABS: 500.0000 mg | ORAL_TABLET | Freq: Three times a day (TID) | ORAL | Status: DC

## 2015-11-27 ENCOUNTER — Inpatient Hospital Stay
Admission: EM | Admit: 2015-11-27 | Discharge: 2015-11-30 | DRG: 816 | Disposition: A | Discharge status: Home / Self Care | Payer: Medicare Other | Attending: Internal Medicine | Admitting: Internal Medicine

## 2015-11-27 ENCOUNTER — Other Ambulatory Visit: Payer: Self-pay | Admitting: Family Medicine

## 2015-11-27 ENCOUNTER — Other Ambulatory Visit
Admission: RE | Admit: 2015-11-27 | Discharge: 2015-11-27 | Disposition: A | Discharge status: Home / Self Care | Payer: Medicare Other | Source: Ambulatory Visit | Attending: Family Medicine | Admitting: Family Medicine

## 2015-11-27 ENCOUNTER — Ambulatory Visit (INDEPENDENT_AMBULATORY_CARE_PROVIDER_SITE_OTHER): Payer: Medicare Other | Admitting: Family Medicine

## 2015-11-27 ENCOUNTER — Encounter: Payer: Self-pay | Admitting: Family Medicine

## 2015-11-27 ENCOUNTER — Ambulatory Visit
Admission: RE | Admit: 2015-11-27 | Discharge: 2015-11-27 | Disposition: A | Discharge status: Home / Self Care | Payer: Medicare Other | Source: Ambulatory Visit | Attending: Family Medicine | Admitting: Family Medicine

## 2015-11-27 VITALS — BP 120/62 | HR 72 | Temp 98.8°F | Resp 16 | Wt 202.2 lb

## 2015-11-27 DIAGNOSIS — K210 Gastro-esophageal reflux disease with esophagitis: Secondary | ICD-10-CM | POA: Diagnosis present

## 2015-11-27 DIAGNOSIS — M549 Dorsalgia, unspecified: Secondary | ICD-10-CM

## 2015-11-27 DIAGNOSIS — Z79899 Other long term (current) drug therapy: Secondary | ICD-10-CM

## 2015-11-27 DIAGNOSIS — Z87891 Personal history of nicotine dependence: Secondary | ICD-10-CM

## 2015-11-27 DIAGNOSIS — D735 Infarction of spleen: Secondary | ICD-10-CM | POA: Diagnosis not present

## 2015-11-27 DIAGNOSIS — K5732 Diverticulitis of large intestine without perforation or abscess without bleeding: Secondary | ICD-10-CM | POA: Diagnosis not present

## 2015-11-27 DIAGNOSIS — N400 Enlarged prostate without lower urinary tract symptoms: Secondary | ICD-10-CM | POA: Diagnosis present

## 2015-11-27 DIAGNOSIS — M1990 Unspecified osteoarthritis, unspecified site: Secondary | ICD-10-CM | POA: Diagnosis present

## 2015-11-27 DIAGNOSIS — I10 Essential (primary) hypertension: Secondary | ICD-10-CM | POA: Diagnosis present

## 2015-11-27 DIAGNOSIS — R05 Cough: Secondary | ICD-10-CM

## 2015-11-27 DIAGNOSIS — R059 Cough, unspecified: Secondary | ICD-10-CM

## 2015-11-27 DIAGNOSIS — E785 Hyperlipidemia, unspecified: Secondary | ICD-10-CM | POA: Diagnosis present

## 2015-11-27 DIAGNOSIS — R0600 Dyspnea, unspecified: Secondary | ICD-10-CM | POA: Diagnosis not present

## 2015-11-27 DIAGNOSIS — K219 Gastro-esophageal reflux disease without esophagitis: Secondary | ICD-10-CM | POA: Diagnosis present

## 2015-11-27 DIAGNOSIS — I82890 Acute embolism and thrombosis of other specified veins: Secondary | ICD-10-CM | POA: Diagnosis present

## 2015-11-27 DIAGNOSIS — I6529 Occlusion and stenosis of unspecified carotid artery: Secondary | ICD-10-CM | POA: Diagnosis present

## 2015-11-27 DIAGNOSIS — R0602 Shortness of breath: Secondary | ICD-10-CM

## 2015-11-27 HISTORY — DX: Benign prostatic hyperplasia without lower urinary tract symptoms: N40.0

## 2015-11-27 HISTORY — DX: Acute embolism and thrombosis of other specified veins: I82.890

## 2015-11-27 LAB — CBC WITH DIFFERENTIAL/PLATELET
Basophils Absolute: 0.1 10*3/uL (ref 0–0.1)
Basophils Relative: 1 %
EOS ABS: 0.2 10*3/uL (ref 0–0.7)
Eosinophils Relative: 1 %
HCT: 45 % (ref 40.0–52.0)
HEMOGLOBIN: 14.9 g/dL (ref 13.0–18.0)
LYMPHS ABS: 1.7 10*3/uL (ref 1.0–3.6)
Lymphocytes Relative: 12 %
MCH: 29.7 pg (ref 26.0–34.0)
MCHC: 33.1 g/dL (ref 32.0–36.0)
MCV: 89.7 fL (ref 80.0–100.0)
Monocytes Absolute: 2 10*3/uL — ABNORMAL HIGH (ref 0.2–1.0)
Monocytes Relative: 15 %
NEUTROS PCT: 71 %
Neutro Abs: 10 10*3/uL — ABNORMAL HIGH (ref 1.4–6.5)
Platelets: 226 10*3/uL (ref 150–440)
RBC: 5.02 MIL/uL (ref 4.40–5.90)
RDW: 13.3 % (ref 11.5–14.5)
WBC: 14.1 10*3/uL — AB (ref 3.8–10.6)

## 2015-11-27 LAB — COMPREHENSIVE METABOLIC PANEL
ALBUMIN: 3.3 g/dL — AB (ref 3.5–5.0)
ALT: 10 U/L — AB (ref 17–63)
AST: 12 U/L — AB (ref 15–41)
Alkaline Phosphatase: 54 U/L (ref 38–126)
Anion gap: 8 (ref 5–15)
BUN: 28 mg/dL — AB (ref 6–20)
CHLORIDE: 101 mmol/L (ref 101–111)
CO2: 27 mmol/L (ref 22–32)
CREATININE: 1.14 mg/dL (ref 0.61–1.24)
Calcium: 8.4 mg/dL — ABNORMAL LOW (ref 8.9–10.3)
GFR calc Af Amer: >60 mL/min (ref >60)
GFR, EST NON AFRICAN AMERICAN: 56 mL/min — AB (ref >60)
GLUCOSE: 104 mg/dL — AB (ref 65–99)
Potassium: 4.2 mmol/L (ref 3.5–5.1)
SODIUM: 136 mmol/L (ref 135–145)
Total Bilirubin: 1.2 mg/dL (ref 0.3–1.2)
Total Protein: 6.5 g/dL (ref 6.5–8.1)

## 2015-11-27 LAB — APTT: APTT: 28 s (ref 24–36)

## 2015-11-27 LAB — PROTIME-INR
INR: 1.26
Prothrombin Time: 15.9 seconds — ABNORMAL HIGH (ref 11.4–15.0)

## 2015-11-27 LAB — POCT I-STAT CREATININE: Creatinine, Ser: 1.3 mg/dL — ABNORMAL HIGH (ref 0.61–1.24)

## 2015-11-27 LAB — LIPASE, BLOOD: Lipase: 38 U/L (ref 11–51)

## 2015-11-27 MED ORDER — ONDANSETRON HCL 4 MG/2ML IJ SOLN: 4.0000 mg | Freq: Four times a day (QID) | INTRAMUSCULAR | Status: DC | PRN

## 2015-11-27 MED ORDER — ACETAMINOPHEN 325 MG PO TABS: 650.0000 mg | ORAL_TABLET | Freq: Four times a day (QID) | ORAL | Status: DC | PRN

## 2015-11-27 MED ORDER — FINASTERIDE 5 MG PO TABS
5.0000 mg | ORAL_TABLET | Freq: Every day | ORAL | Status: DC
  Administered 2015-11-28 – 2015-11-30 (×3): 5 mg via ORAL
  Filled 2015-11-27 (×3): qty 1

## 2015-11-27 MED ORDER — DIPHENHYDRAMINE HCL 50 MG/ML IJ SOLN
25.0000 mg | Freq: Once | INTRAMUSCULAR | Status: AC
  Administered 2015-11-27: 25 mg via INTRAVENOUS
  Filled 2015-11-27: qty 1

## 2015-11-27 MED ORDER — HEPARIN BOLUS VIA INFUSION
4500.0000 [IU] | Freq: Once | INTRAVENOUS | Status: AC
  Administered 2015-11-27: 4500 [IU] via INTRAVENOUS
  Filled 2015-11-27: qty 4500

## 2015-11-27 MED ORDER — SODIUM CHLORIDE 0.9 % IV SOLN
INTRAVENOUS | Status: DC
  Administered 2015-11-27: via INTRAVENOUS

## 2015-11-27 MED ORDER — ONDANSETRON HCL 4 MG PO TABS: 4.0000 mg | ORAL_TABLET | Freq: Four times a day (QID) | ORAL | Status: DC | PRN

## 2015-11-27 MED ORDER — PANTOPRAZOLE SODIUM 40 MG PO TBEC
40.0000 mg | DELAYED_RELEASE_TABLET | Freq: Every day | ORAL | Status: DC
  Administered 2015-11-28 – 2015-11-30 (×3): 40 mg via ORAL
  Filled 2015-11-27 (×3): qty 1

## 2015-11-27 MED ORDER — IOHEXOL 300 MG/ML  SOLN
100.0000 mL | Freq: Once | INTRAMUSCULAR | Status: AC | PRN
Start: 2015-11-27 — End: 2015-11-27
  Administered 2015-11-27: 100 mL via INTRAVENOUS

## 2015-11-27 MED ORDER — LISINOPRIL 20 MG PO TABS
20.0000 mg | ORAL_TABLET | Freq: Every day | ORAL | Status: DC
  Administered 2015-11-28 – 2015-11-29 (×2): 20 mg via ORAL
  Filled 2015-11-27 (×2): qty 1

## 2015-11-27 MED ORDER — HEPARIN (PORCINE) IN NACL 100-0.45 UNIT/ML-% IJ SOLN
16.0000 [IU]/kg/h | INTRAMUSCULAR | Status: DC
  Administered 2015-11-27: 16 [IU]/kg/h via INTRAVENOUS
  Filled 2015-11-27 (×2): qty 250

## 2015-11-27 MED ORDER — ATORVASTATIN CALCIUM 20 MG PO TABS
20.0000 mg | ORAL_TABLET | Freq: Every day | ORAL | Status: DC
  Administered 2015-11-28 – 2015-11-30 (×3): 20 mg via ORAL
  Filled 2015-11-27 (×3): qty 1

## 2015-11-27 MED ORDER — ACETAMINOPHEN 650 MG RE SUPP: 650.0000 mg | Freq: Four times a day (QID) | RECTAL | Status: DC | PRN

## 2015-11-27 MED ORDER — BRIMONIDINE TARTRATE 0.2 % OP SOLN
1.0000 [drp] | Freq: Two times a day (BID) | OPHTHALMIC | Status: DC
  Administered 2015-11-27 – 2015-11-29 (×5): 1 [drp] via OPHTHALMIC
  Filled 2015-11-27: qty 5

## 2015-11-27 NOTE — H&P (Addendum)
[place] [facility] [place] NAME: Jose Castro    MR#:  [id number]  DATE OF BIRTH:  1926/11/08  DATE OF ADMISSION:  11/27/2015  PRIMARY CARE PHYSICIAN: Ashok Norris, MD   REQUESTING/REFERRING PHYSICIAN: Karma Greaser, M.D.  CHIEF COMPLAINT:   Chief Complaint  Patient presents with  . Abdominal Pain    HISTORY OF PRESENT ILLNESS:  Jose Castro  is a 81 y.o. male who presents after CT scan ordered by outpatient physician which showed splenic vein thrombosis. Patient states that a couple weeks ago he began having some left upper quadrant pain. He went to his outpatient physician who prescribed ciprofloxacin for him as he has a known history of diverticulitis. His pain improved some, and he went back for repeat follow-up. He was noted to still have an elevated white count, so CT scan was ordered. 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 Patient was told to come to the ED for evaluation. Workup here confirmed the same. Hospitalists were called for admission.  PAST MEDICAL HISTORY:   Past Medical History  Diagnosis Date  . Hypertension   . GERD (gastroesophageal reflux disease)   . Hyperlipidemia   . BPH (benign prostatic hyperplasia)   . Splenic vein thrombosis     PAST SURGICAL HISTORY:   Past Surgical History  Procedure Laterality Date  . Kidney stone surgery    . Appendectomy    . Knee surgery      SOCIAL HISTORY:   Social History  Substance Use Topics  . Smoking status: Former Smoker -- 16 years    Types: Cigarettes    Start date: 10/21/1944    Quit date: 10/21/1960  . Smokeless tobacco: Never Used  . Alcohol Use: 0.0 oz/week    0 Standard drinks or equivalent per week    FAMILY HISTORY:   Family History  Problem Relation Age of Onset  . Cancer Brother   . Heart disease Mother     DRUG ALLERGIES:   Allergies  Allergen Reactions  . Penicillins Itching and Swelling  . Gabapentin Hives     GI upset.    MEDICATIONS AT HOME:   Prior to Admission medications   Medication Sig Start Date End Date Taking? Authorizing Provider  atorvastatin (LIPITOR) 20 MG tablet Take 1 tablet (20 mg total) by mouth daily. 06/25/15  Yes Ashok Norris, MD  brimonidine (ALPHAGAN) 0.2 % ophthalmic solution Place 1 drop into both eyes 2 (two) times daily.  06/11/15  Yes Historical Provider, MD  Calcium Carbonate-Vitamin D (CALCIUM-VITAMIN D) 500-200 MG-UNIT per tablet Take 1 tablet by mouth daily.   Yes Historical Provider, MD  CIALIS 5 MG tablet Take 5 mg by mouth daily.  06/11/15  Yes Historical Provider, MD  ciprofloxacin (CIPRO) 250 MG tablet Take 1 tablet (250 mg total) by mouth 2 (two) times daily. 11/23/15  Yes Steele Sizer, MD  finasteride (PROSCAR) 5 MG tablet Take 5 mg by mouth daily.  06/11/15  Yes Historical Provider, MD  lisinopril (PRINIVIL,ZESTRIL) 20 MG tablet Take 20 mg by mouth daily.  03/22/15  Yes Historical Provider, MD  omega-3 acid ethyl esters (LOVAZA) 1 g capsule Take 1 g by mouth daily.   Yes Historical Provider, MD  pantoprazole (PROTONIX) 40 MG tablet TAKE ONE TABLET EVERY DAY 10/04/15  Yes Steele Sizer, MD    REVIEW OF SYSTEMS:  Review of Systems  Constitutional: Negative for fever, chills, weight loss and malaise/fatigue.  HENT: Negative for  ear pain, hearing loss and tinnitus.   Eyes: Negative for blurred vision, double vision, pain and redness.  Respiratory: Negative for cough, hemoptysis and shortness of breath.   Cardiovascular: Negative for chest pain, palpitations, orthopnea and leg swelling.  Gastrointestinal: Negative for nausea, vomiting, abdominal pain, diarrhea and constipation.       No outright abdominal pain, that he does have abdominal pressure in his left upper quadrant  Genitourinary: Negative for dysuria, frequency and hematuria.  Musculoskeletal: Negative for back pain, joint pain and neck pain.  Skin:       No acne, rash, or lesions  Neurological:  Negative for dizziness, tremors, focal weakness and weakness.  Endo/Heme/Allergies: Negative for polydipsia. Does not bruise/bleed easily.  Psychiatric/Behavioral: Negative for depression. The patient is not nervous/anxious and does not have insomnia.      VITAL SIGNS:   Filed Vitals:   11/27/15 1808 11/27/15 2030  BP: 124/64 143/89  Pulse: 70 64  Temp: 99.3 F (37.4 C)   TempSrc: Oral   Resp: 18 16  Height: '5\' 10"'$  (1.778 m)   Weight: 91.627 kg (202 lb)   SpO2: 95% 95%   Wt Readings from Last 3 Encounters:  11/27/15 91.627 kg (202 lb)  11/27/15 91.717 kg (202 lb 3.2 oz)  10/22/15 93.895 kg (207 lb)    PHYSICAL EXAMINATION:  Physical Exam  Vitals reviewed. Constitutional: He is oriented to person, place, and time. He appears well-developed and well-nourished. No distress.  HENT:  Head: Normocephalic and atraumatic.  Mouth/Throat: Oropharynx is clear and moist.  Eyes: Conjunctivae and EOM are normal. Pupils are equal, round, and reactive to light. No scleral icterus.  Neck: Normal range of motion. Neck supple. No JVD present. No thyromegaly present.  Cardiovascular: Normal rate, regular rhythm and intact distal pulses.  Exam reveals no gallop and no friction rub.   No murmur heard. Respiratory: Effort normal and breath sounds normal. No respiratory distress. He has no wheezes. He has no rales.  GI: Soft. Bowel sounds are normal. He exhibits no distension. There is no tenderness.  Splenomegaly with spleen tip palpable on exam  Musculoskeletal: Normal range of motion. He exhibits no edema.  No arthritis, no gout  Lymphadenopathy:    He has no cervical adenopathy.  Neurological: He is alert and oriented to person, place, and time. No cranial nerve deficit.  No dysarthria, no aphasia  Skin: Skin is warm and dry. No rash noted. No erythema.  Psychiatric: He has a normal mood and affect. His behavior is normal. Judgment and thought content normal.    LABORATORY PANEL:    CBC  Recent Labs Lab 11/27/15 1500  WBC 14.1*  HGB 14.9  HCT 45.0  PLT 226   ------------------------------------------------------------------------------------------------------------------  Chemistries   Recent Labs Lab 11/27/15 1958  NA 136  K 4.2  CL 101  CO2 27  GLUCOSE 104*  BUN 28*  CREATININE 1.14  CALCIUM 8.4*  AST 12*  ALT 10*  ALKPHOS 54  BILITOT 1.2   ------------------------------------------------------------------------------------------------------------------  Cardiac Enzymes No results for input(s): TROPONINI in the last 168 hours. ------------------------------------------------------------------------------------------------------------------  RADIOLOGY:  Dg Chest 2 View  11/27/2015  CLINICAL DATA:  Abdominal pain, elevated white count. EXAM: CHEST  2 VIEW COMPARISON:  01/02/2014 FINDINGS: Minimal left base atelectasis. Right lung is clear. Heart is normal size. No effusions. No acute bony abnormality. IMPRESSION: Minimal left base atelectasis. Electronically Signed   By: Rolm Baptise M.D.   On: 11/27/2015 15:38   Ct Abdomen Pelvis  W Contrast  11/27/2015  CLINICAL DATA:  LEFT lower quadrant abdominal pain for 2 days, CVA tenderness, RIGHT lower quadrant pain, diarrhea hypertension, EXAM: CT ABDOMEN AND PELVIS WITH CONTRAST TECHNIQUE: Multidetector CT imaging of the abdomen and pelvis was performed using the standard protocol following bolus administration of intravenous contrast. Sagittal and coronal MPR images reconstructed from axial data set. CONTRAST:  151m OMNIPAQUE IOHEXOL 300 MG/ML SOLN IV. Dilute oral contrast. COMPARISON:  11/23/2013 FINDINGS: Emphysematous changes with minimal bibasilar atelectasis LEFT greater than RIGHT. Increase in size of spleen versus previous exam with geographic areas of low-attenuation and minimal perisplenic edema compatible with multiple splenic infarcts. No evidence of splenic rupture/hemorrhage. Splenic artery  demonstrates calcifications but appears opacified/patent. Thrombus identified in splenic vein at splenic hilum. Tiny nonspecific low-attenuation focus superiorly lateral segment LEFT lobe liver 6 mm diameter image 25 grossly unchanged. Small hepatic cyst medial segment LEFT lobe liver 10 x 6 mm image 32. Small BILATERAL renal cysts. Dependent gallstones in gallbladder. Remainder of liver, pancreas, kidneys, and adrenal glands normal. Appendix surgically absent by history. Diverticulosis of descending and sigmoid colon without evidence of diverticulitis. Stomach and bowel loops otherwise normal appearance. Prostatic enlargement gland measuring 6.2 x 4.6 cm image 96, containing areas of heterogeneous high and low attenuation, nonspecific. Bladder and ureters normal. Scattered atherosclerotic calcifications without aortic aneurysm. No mass, adenopathy, free air or free fluid. Scattered degenerative changes thoracolumbar spine without acute bony findings. IMPRESSION: Multiple splenic infarcts with evidence of thrombus within the splenic vein at the splenic hilum. Distal colonic diverticulosis without evidence of diverticulitis. Prostatic enlargement. Cholelithiasis. Findings called to Dr. SAncil Boozeron 11/27/2015 at 1724 hr. Electronically Signed   By: MLavonia DanaM.D.   On: 11/27/2015 17:24    EKG:   Orders placed or performed in visit on 11/30/13  . EKG 12-Lead    IMPRESSION AND PLAN:  Principal Problem:   Splenic vein thrombosis - unclear cause at this time. Perhaps he didn't have a true diverticulitis couple weeks ago which may have been the inciting factor for this. However, he will likely need at least an initial screening to rule out question of any malignancy as well. He does not give any report of previous coagulopathy or family history of coagulopathy. He has not had any recent travel or other inciting risk factors for DVT. he does not report any "B" symptoms. Heparin drip started. Surgical consult  placed.  Active Problems:   Essential hypertension - controlled, continue home antihypertensives.    Hyperlipemia - continue home statin    Gastroesophageal reflux disease with esophagitis - equivalent home dose PPI    Arthritis, degenerative - when necessary analgesics   All the records are reviewed and case discussed with ED provider. Management plans discussed with the patient and/or family.  DVT PROPHYLAXIS: Systemic anticoagulation  GI PROPHYLAXIS: PPI  ADMISSION STATUS: Inpatient  CODE STATUS: Full  TOTAL TIME TAKING CARE OF THIS PATIENT: 45 minutes.    Brogan Martis FWorley12/27/2016, 9:29 PM  [facility]ch[facility]ospitalists  Office  [phone] CC: Primary care physician; LAshok Norris MD

## 2015-11-27 NOTE — Progress Notes (Signed)
Name: Jose Castro   MRN: [id number]    DOB: Feb 12, 1926   Date:11/27/2015       Progress Note  Subjective  Chief Complaint  Chief Complaint  Patient presents with  . Abdominal Pain    5 day follow-up improving only bothers him when he takes a deep breath in    HPI  Abdominal pain: he states he had cereal five days ago with old milk, developed some abdominal pain and after that had a drink of alcohol ( [place] ) with a friend and developed epigastric pain, and nausea about 20 minutes later.  Usually has bowel movements at least every other day, he was constipated last week, but is now having bowel movements multiple times daily. Nausea resolved. He has also noticed a mild cough, and also some pleuritic chest pain, no rashes, no fever, chills, no blood in stools, no dysuria, no change in urinary frequency. He has noticed a lack of appetite. He has a history of kidney stones - but this pain feels different.  His WBC was high when seen last week, but he refused to go to [facility], he is taking Metronidazole and Cipro, he is eating solids since yesterday ( ate prime ribs yesterday  and was fine). He is feeling better since he started on antibiotics.    Patient Active Problem List   Diagnosis Date Noted  . Arteriopathy ([place]) 11/22/2015  . Carotid artery narrowing 11/22/2015  . Diverticulosis of colon 11/22/2015  . Decreased creatinine clearance 11/22/2015  . Arthritis, degenerative 11/22/2015  . Lactose intolerance 11/22/2015  . Basal cell carcinoma 11/22/2015  . Left-sided low back pain with left-sided sciatica 06/21/2015  . Essential hypertension 06/21/2015  . Hyperlipemia 06/21/2015  . Gastroesophageal reflux disease with esophagitis 06/21/2015  . Calculus of kidney 11/18/2013  . Pure hypercholesterolemia 03/14/2008    Past Surgical History  Procedure Laterality Date  . Kidney stone surgery    . Appendectomy    . Knee surgery      Family History  Problem Relation Age of Onset  .  Cancer Brother   . Heart disease Mother     Social History   Social History  . Marital Status: Widowed    Spouse Name: N/A  . Number of Children: N/A  . Years of Education: N/A   Occupational History  . Not on file.   Social History Main Topics  . Smoking status: Former Smoker -- 16 years    Types: Cigarettes    Start date: 10/21/1944    Quit date: 10/21/1960  . Smokeless tobacco: Never Used  . Alcohol Use: 0.0 oz/week    0 Standard drinks or equivalent per week  . Drug Use: No  . Sexual Activity:    Partners: Female   Other Topics Concern  . Not on file   Social History Narrative     Current outpatient prescriptions:  .  atorvastatin (LIPITOR) 20 MG tablet, Take 1 tablet (20 mg total) by mouth daily., Disp: 90 tablet, Rfl: 3 .  brimonidine (ALPHAGAN) 0.2 % ophthalmic solution, , Disp: , Rfl:  .  Calcium Carbonate-Vitamin D (CALCIUM-VITAMIN D) 500-200 MG-UNIT per tablet, Take 1 tablet by mouth daily., Disp: , Rfl:  .  CIALIS 5 MG tablet, , Disp: , Rfl:  .  ciprofloxacin (CIPRO) 250 MG tablet, Take 1 tablet (250 mg total) by mouth 2 (two) times daily., Disp: 14 tablet, Rfl: 0 .  finasteride (PROSCAR) 5 MG tablet, , Disp: , Rfl:  .  lisinopril (PRINIVIL,ZESTRIL) 20 MG tablet, , Disp: , Rfl:  .  metroNIDAZOLE (FLAGYL) 500 MG tablet, Take 1 tablet (500 mg total) by mouth 3 (three) times daily., Disp: 21 tablet, Rfl: 0 .  pantoprazole (PROTONIX) 40 MG tablet, TAKE ONE TABLET EVERY DAY, Disp: 90 tablet, Rfl: 0 .  promethazine (PHENERGAN) 12.5 MG tablet, Take 0.5-1 tablets (6.25-12.5 mg total) by mouth every 6 (six) hours as needed for nausea or vomiting., Disp: 12 tablet, Rfl: 0  Allergies  Allergen Reactions  . Penicillins Itching and Swelling  . Gabapentin Hives    GI upset.     ROS  Ten systems reviewed and is negative except as mentioned in HPI   Objective  Filed Vitals:   11/27/15 1323  BP: 120/62  Pulse: 72  Temp: 98.8 F (37.1 C)  TempSrc: Oral   Resp: 16  Weight: 202 lb 3.2 oz (91.717 kg)  SpO2: 95%    Body mass index is 29.01 kg/(m^2).  Physical Exam  Constitutional: Patient appears well-developed and well-nourished. Obese No distress.  HEENT: head atraumatic, normocephalic, pupils equal and reactive to light, neck supple, throat within normal limits Cardiovascular: Normal rate, regular rhythm and normal heart sounds. No murmur heard. No BLE edema. Pulmonary/Chest: Effort normal and breath sounds normal. No respiratory distress. Abdominal: Soft. Lower abdominal , worse on LLQ. He has a history of kidney stones but it feels different, lots of stools on rectum, negative hemocult Psychiatric: Patient has a normal mood and affect. behavior is normal. Judgment and thought   Recent Results (from the past 2160 hour(s))  Comprehensive metabolic panel     Status: Abnormal   Collection Time: 10/22/15  9:54 AM  Result Value Ref Range   Glucose 110 (H) 65 - 99 mg/dL   BUN 20 8 - 27 mg/dL   Creatinine, Ser 1.09 0.76 - 1.27 mg/dL   GFR calc non Af Amer 61 >59 mL/min/1.73   GFR calc Af Amer 70 >59 mL/min/1.73   BUN/Creatinine Ratio 18 10 - 22   Sodium 143 136 - 144 mmol/L   Potassium 4.9 3.5 - 5.2 mmol/L   Chloride 105 97 - 106 mmol/L   CO2 21 18 - 29 mmol/L   Calcium 9.5 8.6 - 10.2 mg/dL   Total Protein 6.6 6.0 - 8.5 g/dL   Albumin 4.2 3.5 - 4.7 g/dL   Globulin, Total 2.4 1.5 - 4.5 g/dL   Albumin/Globulin Ratio 1.8 1.1 - 2.5   Bilirubin Total 0.7 0.0 - 1.2 mg/dL   Alkaline Phosphatase 71 39 - 117 IU/L   AST 11 0 - 40 IU/L   ALT 10 0 - 44 IU/L  Lipid panel     Status: Abnormal   Collection Time: 10/22/15  9:54 AM  Result Value Ref Range   Cholesterol, Total 158 100 - 199 mg/dL   Triglycerides 170 (H) 0 - 149 mg/dL   HDL 37 (L) >39 mg/dL   VLDL Cholesterol Cal 34 5 - 40 mg/dL   LDL Calculated 87 0 - 99 mg/dL   Chol/HDL Ratio 4.3 0.0 - 5.0 ratio units    Comment:                                   T. Chol/HDL Ratio  Men  Women                               1/2 Avg.Risk  3.4    3.3                                   Avg.Risk  5.0    4.4                                2X Avg.Risk  9.6    7.1                                3X Avg.Risk 23.4   11.0   TSH     Status: None   Collection Time: 10/22/15  9:54 AM  Result Value Ref Range   TSH 3.060 0.450 - 4.500 uIU/mL  CBC with Differential/Platelet     Status: Abnormal   Collection Time: 11/22/15  3:15 PM  Result Value Ref Range   WBC 15.7 (H) 3.4 - 10.8 x10E3/uL   RBC 5.57 4.14 - 5.80 x10E6/uL   Hemoglobin 17.1 12.6 - 17.7 g/dL   Hematocrit 48.8 37.5 - 51.0 %   MCV 88 79 - 97 fL   MCH 30.7 26.6 - 33.0 pg   MCHC 35.0 31.5 - 35.7 g/dL   RDW 13.3 12.3 - 15.4 %   Platelets 166 150 - 379 x10E3/uL   Neutrophils 75 %   Lymphs 13 %   Monocytes 12 %   Eos 0 %   Basos 0 %   Neutrophils Absolute 11.6 (H) 1.4 - 7.0 x10E3/uL   Lymphocytes Absolute 2.1 0.7 - 3.1 x10E3/uL   Monocytes Absolute 1.9 (H) 0.1 - 0.9 x10E3/uL   EOS (ABSOLUTE) 0.0 0.0 - 0.4 x10E3/uL   Basophils Absolute 0.0 0.0 - 0.2 x10E3/uL   Immature Granulocytes 0 %   Immature Grans (Abs) 0.0 0.0 - 0.1 x10E3/uL   Hematology Comments: Note:     Comment: Verified by microscopic examination.  Hemoglobin A1c     Status: Abnormal   Collection Time: 11/22/15  3:15 PM  Result Value Ref Range   Hgb A1c MFr Bld 5.7 (H) 4.8 - 5.6 %    Comment:          Pre-diabetes: 5.7 - 6.4          Diabetes: >6.4          Glycemic control for adults with diabetes: <7.0    Est. average glucose Bld gHb Est-mCnc 117 mg/dL  POC Hemoccult Bld/Stl (1-Cd Office Dx)     Status: Normal   Collection Time: 11/22/15  3:29 PM  Result Value Ref Range   Card #1 Date 11/22/2015    Fecal Occult Blood, POC Negative Negative     PHQ2/9: Depression screen [facility] 2/9 10/22/2015 06/21/2015  Decreased Interest 0 0  Down, Depressed, Hopeless 0 0  PHQ - 2 Score 0 0    Fall Risk: Fall Risk   10/22/2015 06/21/2015  Falls in the past year? No No    Assessment & Plan  1. Diverticulitis large intestine w/o perforation or abscess w/o bleeding  We will check labs, still having pain left lower quadrant, but not as severe, it may be diverticulitis,  but if WBC still , we will check CT tomorrow.  - CBC with Differential/Platelet - Comprehensive metabolic panel  2. Cough  Mild cough, elevated WBC and some pleuritic chest pain  - DG Chest 2 View; Future  3. CVA tenderness  With left flank pain, and previous history of kidney stones, we will check CT - CT Abdomen Pelvis Wo Contrast; Future

## 2015-11-27 NOTE — ED Notes (Signed)
Pt waiting for admission.  Family at bedside.  Pt alert.  Iv in place.

## 2015-11-27 NOTE — Addendum Note (Signed)
Addended by: Docia Furl on: 11/27/2015 03:15 PM   Modules accepted: Orders

## 2015-11-27 NOTE — ED Notes (Signed)
pts face flushed and red.  Dr Jannifer Franklin at bedside.  No resp distress. No itching.  Benadryl given per dr Jannifer Franklin order.  Floor nurse called and updated.  Pt transferred to floor.  Heparin infusing.

## 2015-11-27 NOTE — ED Provider Notes (Signed)
[facility] Emergency Department Provider Note  ____________________________________________  Time seen: Approximately 7:38 PM  I have reviewed the triage vital signs and the nursing notes.   HISTORY  Chief Complaint Abdominal Pain    HPI Jose Castro is a 81 y.o. male who presents abdominal pain after having an outpatient CT scan todaywhich revealed splenic infarctions as well as a splenic vein thrombosis.  He reports 4-5 days of gradual onset, intermittent and persistent left upper quadrant pain that radiates into his back.  He was seen by his primary care doctor, Dr. Gwynneth Aliment, who ordered an outpatient CT scan with IV contrast to evaluate for kidney stones versus gallstones versus diverticulitis.  However his scan revealed the splenic abnormalities and she called the family and told them to come to the emergency department.  He reports that this pain is well-controlled at this time and he does not want any pain medicine.  He denies nausea/vomiting, chest pain, shortness of breath.  He described the pain as sharp and stabbing in his left upper quadrant radiating through to his back although it is absent at this time.  Nothing made it better and nothing made it worse.  He has had complicated renal calculi in the past that required a percutaneous drain but he is not having any urinary difficulties at this time.  He is not on any anticoagulation and has not had blood clots in the past.  He has no known diagnosis of malignancy.  He has had problems with his prostate over the years but never had a diagnosis of prostate cancer.   Past Medical History  Diagnosis Date  . Hypertension   . GERD (gastroesophageal reflux disease)   . Hyperlipidemia   . BPH (benign prostatic hyperplasia)   . Splenic vein thrombosis     Patient Active Problem List   Diagnosis Date Noted  . 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 Arteriopathy ([place]) 11/22/2015  . Carotid artery narrowing  11/22/2015  . Diverticulosis of colon 11/22/2015  . Decreased creatinine clearance 11/22/2015  . Arthritis, degenerative 11/22/2015  . Lactose intolerance 11/22/2015  . Basal cell carcinoma 11/22/2015  . Left-sided low back pain with left-sided sciatica 06/21/2015  . Essential hypertension 06/21/2015  . Hyperlipemia 06/21/2015  . Gastroesophageal reflux disease with esophagitis 06/21/2015  . Calculus of kidney 11/18/2013  . Pure hypercholesterolemia 03/14/2008    Past Surgical History  Procedure Laterality Date  . Kidney stone surgery    . Appendectomy    . Knee surgery      No current outpatient prescriptions on file.  Allergies Penicillins and Gabapentin  Family History  Problem Relation Age of Onset  . Cancer Brother   . Heart disease Mother     Social History Social History  Substance Use Topics  . Smoking status: Former Smoker -- 16 years    Types: Cigarettes    Start date: 10/21/1944    Quit date: 10/21/1960  . Smokeless tobacco: Never Used  . Alcohol Use: 0.0 oz/week    0 Standard drinks or equivalent per week    Review of Systems Constitutional: No fever/chills Eyes: No visual changes. ENT: No sore throat. Cardiovascular: Denies chest pain. Respiratory: Denies shortness of breath. Gastrointestinal: gradual onset and intermittent left upper quadrant pain radiating to left flank.  No nausea, no vomiting.  No diarrhea.  No constipation. Genitourinary: Negative for dysuria. Musculoskeletal: Negative for back pain. Skin: Negative for rash. Neurological: Negative for headaches, focal weakness or numbness.  10-point ROS  otherwise negative.  ____________________________________________   PHYSICAL EXAM:  VITAL SIGNS: ED Triage Vitals  Enc Vitals Group     BP 11/27/15 1808 124/64 mmHg     Pulse Rate 11/27/15 1808 70     Resp 11/27/15 1808 18     Temp 11/27/15 1808 99.3 F (37.4 C)     Temp Source 11/27/15 1808 Oral     SpO2 11/27/15 1808 95 %      Weight 11/27/15 1808 202 lb (91.627 kg)     Height 11/27/15 1808 '5\' 10"'$  (1.778 m)     Head Cir --      Peak Flow --      Pain Score 11/27/15 1809 0     Pain Loc --      Pain Edu? --      Excl. in [place]? --     Constitutional: Alert and oriented. Well appearing and in no acute distress. Eyes: Conjunctivae are normal. PERRL. EOMI. Head: Atraumatic. Nose: No congestion/rhinnorhea. Mouth/Throat: Mucous membranes are moist.  Oropharynx non-erythematous. Neck: No stridor.   Cardiovascular: Normal rate, regular rhythm. Grossly normal heart sounds.  Good peripheral circulation. Respiratory: Normal respiratory effort.  No retractions. Lungs CTAB. Gastrointestinal: Soft and nontender. No distention. No abdominal bruits. No CVA tenderness. Musculoskeletal: No lower extremity tenderness nor edema.  No joint effusions. Neurologic:  Normal speech and language. No gross focal neurologic deficits are appreciated.  Skin:  Skin is warm, dry and intact. No rash noted. Psychiatric: Mood and affect are normal. Speech and behavior are normal.  ____________________________________________   LABS (all labs ordered are listed, but only abnormal results are displayed)  Labs Reviewed  COMPREHENSIVE METABOLIC PANEL - Abnormal; Notable for the following:    Glucose, Bld 104 (*)    BUN 28 (*)    Calcium 8.4 (*)    Albumin 3.3 (*)    AST 12 (*)    ALT 10 (*)    GFR calc non Af Amer 56 (*)    All other components within normal limits  PROTIME-INR - Abnormal; Notable for the following:    Prothrombin Time 15.9 (*)    All other components within normal limits  APTT  LIPASE, BLOOD  HEPARIN LEVEL (UNFRACTIONATED)  BASIC METABOLIC PANEL  PROTIME-INR  APTT  CBC WITH DIFFERENTIAL/PLATELET  SEDIMENTATION RATE   ____________________________________________  EKG  Not obtained ____________________________________________  RADIOLOGY   Dg Chest 2 View  11/27/2015  CLINICAL DATA:  Abdominal pain,  elevated white count. EXAM: CHEST  2 VIEW COMPARISON:  01/02/2014 FINDINGS: Minimal left base atelectasis. Right lung is clear. Heart is normal size. No effusions. No acute bony abnormality. IMPRESSION: Minimal left base atelectasis. Electronically Signed   By: Rolm Baptise M.D.   On: 11/27/2015 15:38   Ct Abdomen Pelvis W Contrast  11/27/2015  CLINICAL DATA:  LEFT lower quadrant abdominal pain for 2 days, CVA tenderness, RIGHT lower quadrant pain, diarrhea hypertension, EXAM: CT ABDOMEN AND PELVIS WITH CONTRAST TECHNIQUE: Multidetector CT imaging of the abdomen and pelvis was performed using the standard protocol following bolus administration of intravenous contrast. Sagittal and coronal MPR images reconstructed from axial data set. CONTRAST:  155m OMNIPAQUE IOHEXOL 300 MG/ML SOLN IV. Dilute oral contrast. COMPARISON:  11/23/2013 FINDINGS: Emphysematous changes with minimal bibasilar atelectasis LEFT greater than RIGHT. Increase in size of spleen versus previous exam with geographic areas of low-attenuation and minimal perisplenic edema compatible with multiple splenic infarcts. No evidence of splenic rupture/hemorrhage. Splenic artery demonstrates calcifications but appears opacified/patent.  Thrombus identified in splenic vein at splenic hilum. Tiny nonspecific low-attenuation focus superiorly lateral segment LEFT lobe liver 6 mm diameter image 25 grossly unchanged. Small hepatic cyst medial segment LEFT lobe liver 10 x 6 mm image 32. Small BILATERAL renal cysts. Dependent gallstones in gallbladder. Remainder of liver, pancreas, kidneys, and adrenal glands normal. Appendix surgically absent by history. Diverticulosis of descending and sigmoid colon without evidence of diverticulitis. Stomach and bowel loops otherwise normal appearance. Prostatic enlargement gland measuring 6.2 x 4.6 cm image 96, containing areas of heterogeneous high and low attenuation, nonspecific. Bladder and ureters normal. Scattered  atherosclerotic calcifications without aortic aneurysm. No mass, adenopathy, free air or free fluid. Scattered degenerative changes thoracolumbar spine without acute bony findings. IMPRESSION: Multiple splenic infarcts with evidence of thrombus within the splenic vein at the splenic hilum. Distal colonic diverticulosis without evidence of diverticulitis. Prostatic enlargement. Cholelithiasis. Findings called to Dr. Ancil Boozer on 11/27/2015 at 1724 hr. Electronically Signed   By: Lavonia Dana M.D.   On: 11/27/2015 17:24    ____________________________________________   PROCEDURES  Procedure(s) performed: None, see procedure note(s).  Critical Care performed: None  ____________________________________________   INITIAL IMPRESSION / ASSESSMENT AND PLAN / ED COURSE  Pertinent labs & imaging results that were available during my care of the patient were reviewed by me and considered in my medical decision making (see chart for details).  The patient is hemodynamically stable and in no acute distress.  I spoke by phone with Dr. Delana Meyer who agreed with my plan for heparin with bolus for VTE treatment an admission to the hospital for further management.  Dr. Delana Meyer expressed concern about the possibility of an occult malignancy because of the unusual nature of a splenic vein thrombosis and splenic ischemia.  I shared this with the hospitalist on admission who also pointed out that it is possible to have some ischemia if outflow if impaired.  The hospitalists and specialists will further manage the patient in the hospital.  Stable, family in agreement with the plan.  ____________________________________________  FINAL CLINICAL IMPRESSION(S) / ED DIAGNOSES  Final diagnoses:  Deep vein thrombosis of splenic vein  Splenic infarction      NEW MEDICATIONS STARTED DURING THIS VISIT:  Current Discharge Medication List       Hinda Kehr, MD 11/27/15 [phone]

## 2015-11-27 NOTE — ED Notes (Signed)
PT sent from [facility] after having a CT abd here today that shows a thrombus of the spleen.. Pt states he was seen there this week due to LUQ pain that radiates into the back for the past 5 days.

## 2015-11-27 NOTE — ED Notes (Signed)
Pt sent to er tonight after having a ct scan of abd for an eval of thrombus of spleen. Pt denies abd pain at this time.  No n/v/d. No back pain.  Iv started stat.  Family and md at bedside.

## 2015-11-27 NOTE — Progress Notes (Signed)
ANTICOAGULATION CONSULT NOTE - Initial Consult  Pharmacy Consult for heparin drip Indication: VTE treatment (splenic vein thrombus)  Allergies  Allergen Reactions  . Penicillins Itching and Swelling  . Gabapentin Hives    GI upset.   Patient Measurements: Height: '5\' 10"'$  (177.8 cm) Weight: 202 lb (91.627 kg) IBW/kg (Calculated) : 73 Heparin Dosing Weight: 91.4 kg  Labs:  Recent Labs  11/27/15 1500 11/27/15 1652  HGB 14.9  --   HCT 45.0  --   PLT 226  --   CREATININE  --  1.30*   Estimated Creatinine Clearance: 45.5 mL/min (by C-G formula based on Cr of 1.3).  Assessment: Pharmacy consulted to dose and monitor a heparin drip in this 81 year old male who was found to have thrombus of the splenic vein. Patient was not taking anticoagulants prior to admission per med rec. Baseline labs were obtained in the ED.  Hgb 14.9 Plt 226  INR & APTT pending Heparin dosing weight = 91.4 kg  Goal of Therapy:  Heparin level 0.3-0.7 units/ml Monitor platelets by anticoagulation protocol: Yes   Plan:  Give 4500 units bolus x 1 (~50 units/kg) Start heparin infusion at 1450 units/hr (~16 units/kg/hr) Check anti-Xa level in 8 hours and daily while on heparin Continue to monitor H&H and platelets  Pharmacy will continue to monitor, thank you for the consult.  Darylene Price Jose Castro 11/27/2015,8:06 PM

## 2015-11-28 LAB — CBC WITH DIFFERENTIAL/PLATELET
BASOS ABS: 0.1 10*3/uL (ref 0–0.1)
Basophils Relative: 1 %
Eosinophils Absolute: 0.3 10*3/uL (ref 0–0.7)
Eosinophils Relative: 2 %
HEMATOCRIT: 41.5 % (ref 40.0–52.0)
HEMOGLOBIN: 14 g/dL (ref 13.0–18.0)
LYMPHS PCT: 18 %
Lymphs Abs: 2.1 10*3/uL (ref 1.0–3.6)
MCH: 30.4 pg (ref 26.0–34.0)
MCHC: 33.7 g/dL (ref 32.0–36.0)
MCV: 90.4 fL (ref 80.0–100.0)
MONO ABS: 1.6 10*3/uL — AB (ref 0.2–1.0)
MONOS PCT: 14 %
Neutro Abs: 7.5 10*3/uL — ABNORMAL HIGH (ref 1.4–6.5)
Neutrophils Relative %: 65 %
Platelets: 236 10*3/uL (ref 150–440)
RBC: 4.6 MIL/uL (ref 4.40–5.90)
RDW: 13.5 % (ref 11.5–14.5)
WBC: 11.5 10*3/uL — ABNORMAL HIGH (ref 3.8–10.6)

## 2015-11-28 LAB — HEPARIN LEVEL (UNFRACTIONATED)
HEPARIN UNFRACTIONATED: 0.45 [IU]/mL (ref 0.30–0.70)
Heparin Unfractionated: 0.19 IU/mL — ABNORMAL LOW (ref 0.30–0.70)
Heparin Unfractionated: 0.5 IU/mL (ref 0.30–0.70)

## 2015-11-28 LAB — BASIC METABOLIC PANEL
ANION GAP: 8 (ref 5–15)
BUN: 25 mg/dL — ABNORMAL HIGH (ref 6–20)
CALCIUM: 8 mg/dL — AB (ref 8.9–10.3)
CO2: 23 mmol/L (ref 22–32)
Chloride: 106 mmol/L (ref 101–111)
Creatinine, Ser: 1.01 mg/dL (ref 0.61–1.24)
Glucose, Bld: 101 mg/dL — ABNORMAL HIGH (ref 65–99)
Potassium: 3.6 mmol/L (ref 3.5–5.1)
Sodium: 137 mmol/L (ref 135–145)

## 2015-11-28 LAB — SEDIMENTATION RATE: Sed Rate: 31 mm/hr — ABNORMAL HIGH (ref 0–20)

## 2015-11-28 LAB — PROTIME-INR
INR: 1.29
PROTHROMBIN TIME: 16.2 s — AB (ref 11.4–15.0)

## 2015-11-28 LAB — APTT: APTT: 52 s — AB (ref 24–36)

## 2015-11-28 MED ORDER — HEPARIN BOLUS VIA INFUSION
2700.0000 [IU] | Freq: Once | INTRAVENOUS | Status: AC
Start: 2015-11-28 — End: 2015-11-28
  Administered 2015-11-28: 2700 [IU] via INTRAVENOUS
  Filled 2015-11-28: qty 2700

## 2015-11-28 MED ORDER — HEPARIN (PORCINE) IN NACL 100-0.45 UNIT/ML-% IJ SOLN
1750.0000 [IU]/h | INTRAMUSCULAR | Status: AC
  Administered 2015-11-28 – 2015-11-29 (×4): 1750 [IU]/h via INTRAVENOUS
  Filled 2015-11-28 (×3): qty 250

## 2015-11-28 NOTE — Progress Notes (Signed)
ANTICOAGULATION CONSULT NOTE - Follow Up Consult  Pharmacy Consult for Heparin drip Indication: Splenic Vein Thrombosis  Allergies  Allergen Reactions  . Penicillins Itching and Swelling  . Gabapentin Hives    GI upset.    Patient Measurements: Height: '5\' 10"'$  (177.8 cm) Weight: 200 lb 11.2 oz (91.037 kg) IBW/kg (Calculated) : 73 Heparin Dosing Weight: 91 kg  Vital Signs: Temp: 97.6 F (36.4 C) (12/28 1250) Temp Source: Oral (12/28 1250) BP: 111/58 mmHg (12/28 1250) Pulse Rate: 66 (12/28 1250)  Labs:  Recent Labs  11/27/15 1500 11/27/15 1652 11/27/15 1958 11/28/15 0438 11/28/15 1342  HGB 14.9  --   --  14.0  --   HCT 45.0  --   --  41.5  --   PLT 226  --   --  236  --   APTT  --   --  28 52*  --   LABPROT  --   --  15.9* 16.2*  --   INR  --   --  1.26 1.29  --   HEPARINUNFRC  --   --   --  0.19* 0.50  CREATININE  --  1.30* 1.14 1.01  --     Estimated Creatinine Clearance: 58.5 mL/min (by C-G formula based on Cr of 1.01).   Medications:  Infusions:  . heparin 1,750 Units/hr (11/28/15 1055)    Assessment: Heparin infusing at 1750 units/hr. Heparin level resulted at 0.50  Goal of Therapy:  Heparin level 0.3-0.7 units/ml Monitor platelets by anticoagulation protocol: Yes   Plan:  Will continue with current rate and check a confirmatory level in 8hr at 22:00.   Paulina Fusi, PharmD, BCPS 11/28/2015 4:20 PM

## 2015-11-28 NOTE — Consult Note (Addendum)
[facility] Consult Note  MRN : [id number]  Jose Castro is a 81 y.o. (07-06-1926) male who presents with chief complaint of  Chief Complaint  Patient presents with  . Abdominal Pain  .  History of Present Illness: The patient presented yesterday evening after CT scan ordered by outpatient physician which showed multiple splenic infarcts associated with splenic vein thrombosis. Patient states that a couple weeks ago he began having some left upper quadrant pain. He went to his outpatient physician who prescribed ciprofloxacin for him as he has a known history of diverticulitis. His pain improved some, and he went back for repeat follow-up. He was noted to still have an elevated white count, so CT scan was ordered. 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 Patient was told to come to the ED for evaluation.  He has been followed by Dr. Hulda Humphrey in the past and was never told that he had any aneurysms. He denies palpitations or cardiac arrhythmias by history. There is no history of atrial fibrillation. There is no history of hypercoagulable state. Patient denies past history of DVT.  Current Facility-Administered Medications  Medication Dose Route Frequency Provider Last Rate Last Dose  . acetaminophen (TYLENOL) tablet 650 mg  650 mg Oral Q6H PRN Lance Coon, MD       Or  . acetaminophen (TYLENOL) suppository 650 mg  650 mg Rectal Q6H PRN Lance Coon, MD      . atorvastatin (LIPITOR) tablet 20 mg  20 mg Oral Daily Lance Coon, MD   20 mg at 11/28/15 1027  . brimonidine (ALPHAGAN) 0.2 % ophthalmic solution 1 drop  1 drop Both Eyes BID Lance Coon, MD   1 drop at 11/28/15 1027  . finasteride (PROSCAR) tablet 5 mg  5 mg Oral Daily Lance Coon, MD   5 mg at 11/28/15 1027  . heparin ADULT infusion 100 units/mL (25000 units/250 mL)  1,750 Units/hr Intravenous Continuous Lance Coon, MD 17.5 mL/hr at 11/28/15 1600 1,750 Units/hr at 11/28/15 1600   . lisinopril (PRINIVIL,ZESTRIL) tablet 20 mg  20 mg Oral Daily Lance Coon, MD   20 mg at 11/28/15 1027  . ondansetron (ZOFRAN) tablet 4 mg  4 mg Oral Q6H PRN Lance Coon, MD       Or  . ondansetron [facility]) injection 4 mg  4 mg Intravenous Q6H PRN Lance Coon, MD      . pantoprazole (PROTONIX) EC tablet 40 mg  40 mg Oral Daily Lance Coon, MD   40 mg at 11/28/15 1027    Past Medical History  Diagnosis Date  . Hypertension   . GERD (gastroesophageal reflux disease)   . Hyperlipidemia   . BPH (benign prostatic hyperplasia)   . Splenic vein thrombosis     Past Surgical History  Procedure Laterality Date  . Kidney stone surgery    . Appendectomy    . Knee surgery      Social History Social History  Substance Use Topics  . Smoking status: Former Smoker -- 16 years    Types: Cigarettes    Start date: 10/21/1944    Quit date: 10/21/1960  . Smokeless tobacco: Never Used  . Alcohol Use: 0.0 oz/week    0 Standard drinks or equivalent per week    Family History Family History  Problem Relation Age of Onset  . Cancer Brother   . Heart disease Mother    no history of porphyria, autoimmune disease or bleeding clotting disorders  Allergies  Allergen Reactions  . Penicillins Itching and Swelling  . Gabapentin Hives    GI upset.     REVIEW OF SYSTEMS (Negative unless checked)  Constitutional: '[]'$ Weight loss  '[]'$ Fever  '[]'$ Chills Cardiac: '[]'$ Chest pain   '[]'$ Chest pressure   '[]'$ Palpitations   '[]'$ Shortness of breath when laying flat   '[]'$ Shortness of breath at rest   '[]'$ Shortness of breath with exertion. Vascular:  '[]'$ Pain in legs with walking   '[]'$ Pain in legs at rest   '[]'$ Pain in legs when laying flat   '[]'$ Claudication   '[]'$ Pain in feet when walking  '[]'$ Pain in feet at rest  '[]'$ Pain in feet when laying flat   '[]'$ History of DVT   '[]'$ Phlebitis   '[]'$ Swelling in legs   '[]'$ Varicose veins   '[]'$ Non-healing ulcers Pulmonary:   '[]'$ Uses home oxygen   '[]'$ Productive cough   '[]'$ Hemoptysis   '[]'$ Wheeze  '[]'$ COPD    '[]'$ Asthma Neurologic:  '[]'$ Dizziness  '[]'$ Blackouts   '[]'$ Seizures   '[]'$ History of stroke   '[]'$ History of TIA  '[]'$ Aphasia   '[]'$ Temporary blindness   '[]'$ Dysphagia   '[]'$ Weakness or numbness in arms   '[]'$ Weakness or numbness in legs Musculoskeletal:  '[]'$ Arthritis   '[]'$ Joint swelling   '[]'$ Joint pain   '[]'$ Low back pain Hematologic:  '[]'$ Easy bruising  '[]'$ Easy bleeding   '[]'$ Hypercoagulable state   '[]'$ Anemic  '[]'$ Hepatitis Gastrointestinal:  '[]'$ Blood in stool   '[]'$ Vomiting blood  '[]'$ Gastroesophageal reflux/heartburn   '[]'$ Difficulty swallowing. Genitourinary:  '[]'$ Chronic kidney disease   '[]'$ Difficult urination  '[]'$ Frequent urination  '[]'$ Burning with urination   '[]'$ Blood in urine Skin:  '[]'$ Rashes   '[]'$ Ulcers   '[]'$ Wounds Psychological:  '[]'$ History of anxiety   '[]'$  History of major depression.   Physical Examination  Filed Vitals:   11/27/15 2246 11/27/15 2247 11/28/15 0525 11/28/15 1250  BP: 138/66  135/71 111/58  Pulse: 65  67 66  Temp: 98.4 F (36.9 C)  98.6 F (37 C) 97.6 F (36.4 C)  TempSrc: Oral  Oral Oral  Resp: '17  18 17  '$ Height:      Weight:  91.037 kg (200 lb 11.2 oz)    SpO2: 97%  94% 94%   Body mass index is 28.8 kg/(m^2).  Head: [place]/AT, No temporalis wasting. Prominent temp pulse not noted. Ear/Nose/Throat: Nares w/o erythema or drainage, oropharynx w/o obsrtuction, Mallampati score: Class II.  Dentition good.  Eyes: PERRLA, Sclera nonicteric.  Neck: Supple, no nuchal rigidity.  No bruit or JVD.  Pulmonary:  Breath sounds equal bilaterally, no use of accessory muscles.  Cardiac: RRR, normal S1, S2, no Murmurs, rubs or gallops. Vascular: Broad femoral and popliteal impulses consistent with aneurysms Gastrointestinal: soft, non-tender, non-distended.  Musculoskeletal: Moves all extremities.  No deformity or atrophy. No edema. Neurologic: CN 2-12 intact. Symmetrical.  Speech is fluent.  Psychiatric: Judgment intact, Mood & affect appropriate for pt's clinical situation. Dermatologic: No rashes or ulcers noted.  No  cellulitis or open wounds. Lymph : No Cervical,  or Inguinal lymphadenopathy.      CBC Lab Results  Component Value Date   WBC 11.5* 11/28/2015   HGB 14.0 11/28/2015   HCT 41.5 11/28/2015   MCV 90.4 11/28/2015   PLT 236 11/28/2015    BMET    Component Value Date/Time   NA 137 11/28/2015 0438   NA 143 10/22/2015 0954   NA 138 11/27/2013 0819   K 3.6 11/28/2015 0438   K 4.1 11/27/2013 0819   CL 106 11/28/2015 0438   CL 107 11/27/2013 0819   CO2  23 11/28/2015 0438   CO2 23 11/27/2013 0819   GLUCOSE 101* 11/28/2015 0438   GLUCOSE 110* 10/22/2015 0954   GLUCOSE 101* 11/27/2013 0819   BUN 25* 11/28/2015 0438   BUN 20 10/22/2015 0954   BUN 22* 11/27/2013 0819   CREATININE 1.01 11/28/2015 0438   CREATININE 1.26 11/27/2013 0819   CALCIUM 8.0* 11/28/2015 0438   CALCIUM 8.2* 11/27/2013 0819   GFRNONAA >60 11/28/2015 0438   GFRNONAA 52* 11/27/2013 0819   GFRAA >60 11/28/2015 0438   GFRAA 60* 11/27/2013 0819   Estimated Creatinine Clearance: 58.5 mL/min (by C-G formula based on Cr of 1.01).  COAG Lab Results  Component Value Date   INR 1.29 11/28/2015   INR 1.26 11/27/2015    Radiology CT scan of the abdomen and pelvis performed yesterday demonstrates multiple splenic infarcts associated with splenic venous thrombosis in the splenic hilum itself. The vast majority of the splenic vein as it courses along side the pancreas is free of evidence of thrombus. There no hepatic lesions noted. There is no evidence of mass lesions or metastatic lesions on the scan. 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   Assessment/Plan 1.  Splenic vein thrombosis:  Unclear cause at this time.  He does have evidence of aneurysmal disease in the periphery although he does not have an abdominal aortic aneurysm we do not have evidence as to the status of his thoracic aorta and this could very well be an embolic source. Secondly, a ulcerative plaque within the  thoracic aorta could be responsible for an embolic shower. Based on these 2 possibilities CT scan of the chest will be obtained and given it will require contrast and he just had a scan with contrast yesterday if it could be delayed for a day or 2 that would be optimal. Sometimes splenic infarcts and spinous vein, thrombosis can be associated with malignancy however on the abdominal scan and there is no evidence of diffuse metastatic disease.  However, he will likely need at least an initial screening to rule out question of any malignancy as well. He does not give any report of previous coagulopathy or family history of coagulopathy. He has not had any recent travel or other inciting risk factors for DVT. Heparin drip started. Also embolic shower from paroxysmal atrial fibrillation could be an etiology for the splenic infarcts and therefore I would recommend monitoring his cardiac rhythm perhaps he will need a Holter monitor as an outpatient. He will continue his heparin drip he will be transitioned to oral anticoagulation to treat his splenic vein thrombosis. The choice of oral agent will depend on coverage from his insurance company.  Total length of time in discussion with the patient and the patient's family was 33 minutes.  2. Essential hypertension: controlled, continue home antihypertensives.   3.  Hyperlipemia: continue home statin   4.  Gastroesophageal reflux disease with esophagitis:  equivalent home dose PPI   5. Arthritis, degenerative: when necessary analgesics   6.  Asymptomatic carotid stenosis:  Approximately 40% by Dr Marguerite Olea report  He will need an ultrasound as an out patient, ASA 81 mg daily   Alexsia Klindt, Dolores Lory, MD  11/28/2015 8:41 PM

## 2015-11-28 NOTE — Progress Notes (Signed)
ANTICOAGULATION CONSULT NOTE - Initial Consult  Pharmacy Consult for heparin drip Indication: VTE treatment (splenic vein thrombus)  Allergies  Allergen Reactions  . Penicillins Itching and Swelling  . Gabapentin Hives    GI upset.   Patient Measurements: Height: '5\' 10"'$  (177.8 cm) Weight: 200 lb 11.2 oz (91.037 kg) IBW/kg (Calculated) : 73 Heparin Dosing Weight: 91.4 kg  Labs:  Recent Labs  11/27/15 1500 11/27/15 1652 11/27/15 1958 11/28/15 0438  HGB 14.9  --   --  14.0  HCT 45.0  --   --  41.5  PLT 226  --   --  236  APTT  --   --  28 52*  LABPROT  --   --  15.9* 16.2*  INR  --   --  1.26 1.29  HEPARINUNFRC  --   --   --  0.19*  CREATININE  --  1.30* 1.14 1.01   Estimated Creatinine Clearance: 58.5 mL/min (by C-G formula based on Cr of 1.01).  Assessment: Pharmacy consulted to dose and monitor a heparin drip in this 81 year old male who was found to have thrombus of the splenic vein. Patient was not taking anticoagulants prior to admission per med rec. Baseline labs were obtained in the ED.  Hgb 14.9 Plt 226  INR & APTT pending Heparin dosing weight = 91.4 kg  Goal of Therapy:  Heparin level 0.3-0.7 units/ml Monitor platelets by anticoagulation protocol: Yes   Plan:  Give 4500 units bolus x 1 (~50 units/kg) Start heparin infusion at 1450 units/hr (~16 units/kg/hr) Check anti-Xa level in 8 hours and daily while on heparin Continue to monitor H&H and platelets  12/27 AM heparin level 0.19. 2700 unit bolus and increase rate to 1750 units/hr. Recheck heparin level in 8 hours.  Pharmacy will continue to monitor, thank you for the consult.  Javonne Dorko S 11/28/2015,5:53 AM

## 2015-11-28 NOTE — Progress Notes (Signed)
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  Pt denies any complaints.  CHIEF COMPLAINT:   Chief Complaint  Patient presents with  . Abdominal Pain    REVIEW OF SYSTEMS:    Review of Systems  Constitutional: Negative for fever and chills.  HENT: Negative for hearing loss.   Eyes: Negative for blurred vision, double vision and photophobia.  Respiratory: Negative for cough, hemoptysis and shortness of breath.   Cardiovascular: Negative for palpitations, orthopnea and leg swelling.  Gastrointestinal: Negative for vomiting, abdominal pain and diarrhea.  Genitourinary: Negative for dysuria and urgency.  Musculoskeletal: Negative for myalgias and neck pain.  Skin: Negative for rash.  Neurological: Negative for dizziness, focal weakness, seizures, weakness and headaches.  Psychiatric/Behavioral: Negative for memory loss. The patient does not have insomnia.     Nutrition: Tolerating Diet: Tolerating PT:      DRUG ALLERGIES:   Allergies  Allergen Reactions  . Penicillins Itching and Swelling  . Gabapentin Hives    GI upset.    VITALS:  Blood pressure 111/58, pulse 66, temperature 97.6 F (36.4 C), temperature source Oral, resp. rate 17, height '5\' 10"'$  (1.778 m), weight 91.037 kg (200 lb 11.2 oz), SpO2 94 %.  PHYSICAL EXAMINATION:   Physical Exam  GENERAL:  81 y.o.-year-old patient lying in the bed with no acute distress.  EYES: Pupils equal, round, reactive to light and accommodation. No scleral icterus. Extraocular muscles intact.  HEENT: Head atraumatic, normocephalic. Oropharynx and nasopharynx clear.  NECK:  Supple, no jugular venous distention. No thyroid enlargement, no tenderness.  LUNGS: Normal breath sounds bilaterally, no wheezing, rales,rhonchi or crepitation. No  use of accessory muscles of respiration.  CARDIOVASCULAR: S1, S2 normal. No murmurs, rubs, or gallops.  ABDOMEN: Soft, nontender, nondistended. Bowel sounds present. No organomegaly or mass.  EXTREMITIES: No pedal edema, cyanosis, or clubbing.  NEUROLOGIC: Cranial nerves II through XII are intact. Muscle strength 5/5 in all extremities. Sensation intact. Gait not checked.  PSYCHIATRIC: The patient is alert and oriented x 3.  SKIN: No obvious rash, lesion, or ulcer.    LABORATORY PANEL:   CBC  Recent Labs Lab 11/28/15 0438  WBC 11.5*  HGB 14.0  HCT 41.5  PLT 236   ------------------------------------------------------------------------------------------------------------------  Chemistries   Recent Labs Lab 11/27/15 1958 11/28/15 0438  NA 136 137  K 4.2 3.6  CL 101 106  CO2 27 23  GLUCOSE 104* 101*  BUN 28* 25*  CREATININE 1.14 1.01  CALCIUM 8.4* 8.0*  AST 12*  --   ALT 10*  --   ALKPHOS 54  --   BILITOT 1.2  --    ------------------------------------------------------------------------------------------------------------------  Cardiac Enzymes No results for input(s): TROPONINI in the last 168 hours. ------------------------------------------------------------------------------------------------------------------  RADIOLOGY:  Dg Chest 2 View  11/27/2015  CLINICAL DATA:  Abdominal pain, elevated white count. EXAM: CHEST  2 VIEW COMPARISON:  01/02/2014 FINDINGS: Minimal left base atelectasis. Right lung is clear. Heart is normal size. No effusions. No acute bony abnormality. IMPRESSION: Minimal left base atelectasis. Electronically Signed   By: Rolm Baptise M.D.   On: 11/27/2015 15:38   Ct Abdomen Pelvis W Contrast  11/27/2015  CLINICAL DATA:  LEFT lower quadrant abdominal pain for 2 days, CVA tenderness, RIGHT lower quadrant pain, diarrhea hypertension, EXAM: CT ABDOMEN AND PELVIS  WITH CONTRAST TECHNIQUE: Multidetector CT imaging of the abdomen and pelvis was  performed using the standard protocol following bolus administration of intravenous contrast. Sagittal and coronal MPR images reconstructed from axial data set. CONTRAST:  130m OMNIPAQUE IOHEXOL 300 MG/ML SOLN IV. Dilute oral contrast. COMPARISON:  11/23/2013 FINDINGS: Emphysematous changes with minimal bibasilar atelectasis LEFT greater than RIGHT. Increase in size of spleen versus previous exam with geographic areas of low-attenuation and minimal perisplenic edema compatible with multiple splenic infarcts. No evidence of splenic rupture/hemorrhage. Splenic artery demonstrates calcifications but appears opacified/patent. Thrombus identified in splenic vein at splenic hilum. Tiny nonspecific low-attenuation focus superiorly lateral segment LEFT lobe liver 6 mm diameter image 25 grossly unchanged. Small hepatic cyst medial segment LEFT lobe liver 10 x 6 mm image 32. Small BILATERAL renal cysts. Dependent gallstones in gallbladder. Remainder of liver, pancreas, kidneys, and adrenal glands normal. Appendix surgically absent by history. Diverticulosis of descending and sigmoid colon without evidence of diverticulitis. Stomach and bowel loops otherwise normal appearance. Prostatic enlargement gland measuring 6.2 x 4.6 cm image 96, containing areas of heterogeneous high and low attenuation, nonspecific. Bladder and ureters normal. Scattered atherosclerotic calcifications without aortic aneurysm. No mass, adenopathy, free air or free fluid. Scattered degenerative changes thoracolumbar spine without acute bony findings. IMPRESSION: Multiple splenic infarcts with evidence of thrombus within the splenic vein at the splenic hilum. Distal colonic diverticulosis without evidence of diverticulitis. Prostatic enlargement. Cholelithiasis. Findings called to Dr. SAncil Boozeron 11/27/2015 at 1724 hr. Electronically Signed   By: MLavonia DanaM.D.   On: 11/27/2015 17:24     ASSESSMENT AND PLAN:   Principal Problem:   Splenic vein  thrombosis Active Problems:   Essential hypertension   Hyperlipemia   Gastroesophageal reflux disease with esophagitis   Arthritis, degenerative  1.splenic vein thrombosis: Etiology not known at this time. No history of chronic pancreatitis, no abdominal mass. Will check the tumor markers, a vascular consult for possible angiogram. And also follow-up with the hypercoagulable workup. No need for surgery consult. Continue IV heparin drip at this time. Number 2N: Controlled. 3 GERD continue PPIs. Hyperlipidemia continue statins. BPH continue Flomax.  D/wpatient and patient's daughter.  Recent diverticulitis: Finished antibiotics.  All recent diverticulitis resolved symptoms resolvedthe records are reviewed and case discussed with Care Management/Social Workerr. Management plans discussed with the patient, family and they are in agreement.  CODE STATUS: full  TOTAL TIME TAKING CARE OF THIS PATIENT: 324mutes.   POSSIBLE D/C IN 1-2 DAYS, DEPENDING ON CLINICAL CONDITION.   KOEpifanio Lesches.D on 11/28/2015 at 2:36 PM  Between 7am to 6pm - Pager - [phone]  After 6pm go to [email or web address] - password [email or web address]  Office  [phone]: Primary care physician; LeAshok NorrisMD

## 2015-11-29 ENCOUNTER — Inpatient Hospital Stay (HOSPITAL_COMMUNITY)
Admit: 2015-11-29 | Discharge: 2015-11-29 | Disposition: A | Discharge status: Home / Self Care | Payer: Medicare Other | Attending: Internal Medicine | Admitting: Internal Medicine

## 2015-11-29 DIAGNOSIS — R0600 Dyspnea, unspecified: Secondary | ICD-10-CM

## 2015-11-29 LAB — CBC
HEMATOCRIT: 43.3 % (ref 40.0–52.0)
Hemoglobin: 14.5 g/dL (ref 13.0–18.0)
MCH: 29.9 pg (ref 26.0–34.0)
MCHC: 33.6 g/dL (ref 32.0–36.0)
MCV: 89.1 fL (ref 80.0–100.0)
PLATELETS: 316 10*3/uL (ref 150–440)
RBC: 4.87 MIL/uL (ref 4.40–5.90)
RDW: 13.7 % (ref 11.5–14.5)
WBC: 11.9 10*3/uL — AB (ref 3.8–10.6)

## 2015-11-29 LAB — AFP TUMOR MARKER: AFP-Tumor Marker: 0.8 ng/mL (ref 0.0–8.3)

## 2015-11-29 LAB — HEPARIN LEVEL (UNFRACTIONATED): HEPARIN UNFRACTIONATED: 0.55 [IU]/mL (ref 0.30–0.70)

## 2015-11-29 MED ORDER — APIXABAN 5 MG PO TABS: 5.0000 mg | ORAL_TABLET | Freq: Two times a day (BID) | ORAL | Status: DC

## 2015-11-29 MED ORDER — APIXABAN 5 MG PO TABS
10.0000 mg | ORAL_TABLET | Freq: Two times a day (BID) | ORAL | Status: DC
  Administered 2015-11-29 – 2015-11-30 (×2): 10 mg via ORAL
  Filled 2015-11-29 (×2): qty 2

## 2015-11-29 MED ORDER — APIXABAN 5 MG PO TABS: 10.0000 mg | ORAL_TABLET | Freq: Two times a day (BID) | ORAL | Status: DC

## 2015-11-29 NOTE — Care Management Important Message (Signed)
Important Message  Patient Details  Name: Jose Castro MRN: [id number] Date of Birth: 05-Mar-1926   Medicare Important Message Given:  Yes    [name] 11/29/2015, 11:51 AM

## 2015-11-29 NOTE — Progress Notes (Addendum)
ANTICOAGULATION CONSULT NOTE - Follow Up Consult  Pharmacy Consult for Heparin drip Indication: Splenic Vein Thrombosis  Allergies  Allergen Reactions  . Penicillins Itching and Swelling  . Gabapentin Hives    GI upset.    Patient Measurements: Height: '5\' 10"'$  (177.8 cm) Weight: 200 lb 11.2 oz (91.037 kg) IBW/kg (Calculated) : 73 Heparin Dosing Weight: 91 kg  Vital Signs: Temp: 99.1 F (37.3 C) (12/28 2125) Temp Source: Oral (12/28 2125) BP: 128/68 mmHg (12/28 2125) Pulse Rate: 59 (12/28 2125)  Labs:  Recent Labs  11/27/15 1500 11/27/15 1652 11/27/15 1958 11/28/15 0438 11/28/15 1342 11/28/15 2204  HGB 14.9  --   --  14.0  --   --   HCT 45.0  --   --  41.5  --   --   PLT 226  --   --  236  --   --   APTT  --   --  28 52*  --   --   LABPROT  --   --  15.9* 16.2*  --   --   INR  --   --  1.26 1.29  --   --   HEPARINUNFRC  --   --   --  0.19* 0.50 0.45  CREATININE  --  1.30* 1.14 1.01  --   --     Estimated Creatinine Clearance: 58.5 mL/min (by C-G formula based on Cr of 1.01).   Medications:  Infusions:  . heparin 1,750 Units/hr (11/28/15 2302)    Assessment: Heparin infusing at 1750 units/hr. Heparin level resulted at 0.50  Goal of Therapy:  Heparin level 0.3-0.7 units/ml Monitor platelets by anticoagulation protocol: Yes   Plan:  Will continue with current rate and check a confirmatory level in 8hr at 22:00.  12/28 22:00 heparin level 0.45. Continue current regimen.  Heparin level and CBC in AM.  12/29 AM heparin level 0.55. Continue current regimen. Recheck heparin level and CBC tomorrow AM.   Sim Boast, PharmD, BCPS 11/29/2015 12:00 AM

## 2015-11-29 NOTE — Care Management (Signed)
Spoke with patient and family regarding discharge plan. Patient stated that he is from home alone but very independent. He is quite talkative and very knowledgeable about his care and situation. No DME at home. Insured. May  Discharge on Eliquis. May need pre auth for this medication will give 30 trial card. Will contact [employer] for [employer] inquiry.

## 2015-11-29 NOTE — Progress Notes (Signed)
[facility] Castro at [place] NAME: Jose Castro    MR#:  [id number]  DATE OF BIRTH:  [birth year]  SUBJECTIVE: 81 year old male patient admitted for abdominal pain, found to have splenic vein thrombosis.  He seen today. Denies any complaints. Wants to start  Eliquis instead of Warfarin,  CHIEF COMPLAINT:   Chief Complaint  Patient presents with  . Abdominal Pain    REVIEW OF SYSTEMS:    Review of Systems  Constitutional: Negative for fever and chills.  HENT: Negative for hearing loss.   Eyes: Negative for blurred vision, double vision and photophobia.  Respiratory: Negative for cough, hemoptysis and shortness of breath.   Cardiovascular: Negative for palpitations, orthopnea and leg swelling.  Gastrointestinal: Negative for vomiting, abdominal pain and diarrhea.  Genitourinary: Negative for dysuria and urgency.  Musculoskeletal: Negative for myalgias and neck pain.  Skin: Negative for rash.  Neurological: Negative for dizziness, focal weakness, seizures, weakness and headaches.  Psychiatric/Behavioral: Negative for memory loss. The patient does not have insomnia.     Nutrition: Tolerating Diet: Tolerating PT:      DRUG ALLERGIES:   Allergies  Allergen Reactions  . Penicillins Itching and Swelling  . Gabapentin Hives    GI upset.    VITALS:  Blood pressure 130/60, pulse 74, temperature 99.6 F (37.6 C), temperature source Oral, resp. rate 17, height '5\' 10"'$  (1.778 m), weight 91.037 kg (200 lb 11.2 oz), SpO2 94 %.  PHYSICAL EXAMINATION:   Physical Exam  GENERAL:  81 y.o.-year-old patient lying in the bed with no acute distress.  EYES: Pupils equal, round, reactive to light and accommodation. No scleral icterus. Extraocular muscles intact.  HEENT: Head atraumatic, normocephalic. Oropharynx and nasopharynx clear.  NECK:  Supple, no jugular venous distention. No thyroid enlargement, no tenderness.  LUNGS: Normal breath sounds  bilaterally, no wheezing, rales,rhonchi or crepitation. No use of accessory muscles of respiration.  CARDIOVASCULAR: S1, S2 normal. No murmurs, rubs, or gallops.  ABDOMEN: Soft, nontender, nondistended. Bowel sounds present. No organomegaly or mass.  EXTREMITIES: No pedal edema, cyanosis, or clubbing.  NEUROLOGIC: Cranial nerves II through XII are intact. Muscle strength 5/5 in all extremities. Sensation intact. Gait not checked.  PSYCHIATRIC: The patient is alert and oriented x 3.  SKIN: No obvious rash, lesion, or ulcer.    LABORATORY PANEL:   CBC  Recent Labs Lab 11/29/15 0459  WBC 11.9*  HGB 14.5  HCT 43.3  PLT 316   ------------------------------------------------------------------------------------------------------------------  Chemistries   Recent Labs Lab 11/27/15 1958 11/28/15 0438  NA 136 137  K 4.2 3.6  CL 101 106  CO2 27 23  GLUCOSE 104* 101*  BUN 28* 25*  CREATININE 1.14 1.01  CALCIUM 8.4* 8.0*  AST 12*  --   ALT 10*  --   ALKPHOS 54  --   BILITOT 1.2  --    ------------------------------------------------------------------------------------------------------------------  Cardiac Enzymes No results for input(s): TROPONINI in the last 168 hours. ------------------------------------------------------------------------------------------------------------------  RADIOLOGY:  Dg Chest 2 View  11/27/2015  CLINICAL DATA:  Abdominal pain, elevated white count. EXAM: CHEST  2 VIEW COMPARISON:  01/02/2014 FINDINGS: Minimal left base atelectasis. Right lung is clear. Heart is normal size. No effusions. No acute bony abnormality. IMPRESSION: Minimal left base atelectasis. Electronically Signed   By: Rolm Baptise M.D.   On: 11/27/2015 15:38   Ct Abdomen Pelvis W Contrast  11/27/2015  CLINICAL DATA:  LEFT lower quadrant abdominal pain for 2 days, CVA tenderness, RIGHT  lower quadrant pain, diarrhea hypertension, EXAM: CT ABDOMEN AND PELVIS WITH CONTRAST TECHNIQUE:  Multidetector CT imaging of the abdomen and pelvis was performed using the standard protocol following bolus administration of intravenous contrast. Sagittal and coronal MPR images reconstructed from axial data set. CONTRAST:  122m OMNIPAQUE IOHEXOL 300 MG/ML SOLN IV. Dilute oral contrast. COMPARISON:  11/23/2013 FINDINGS: Emphysematous changes with minimal bibasilar atelectasis LEFT greater than RIGHT. Increase in size of spleen versus previous exam with geographic areas of low-attenuation and minimal perisplenic edema compatible with multiple splenic infarcts. No evidence of splenic rupture/hemorrhage. Splenic artery demonstrates calcifications but appears opacified/patent. Thrombus identified in splenic vein at splenic hilum. Tiny nonspecific low-attenuation focus superiorly lateral segment LEFT lobe liver 6 mm diameter image 25 grossly unchanged. Small hepatic cyst medial segment LEFT lobe liver 10 x 6 mm image 32. Small BILATERAL renal cysts. Dependent gallstones in gallbladder. Remainder of liver, pancreas, kidneys, and adrenal glands normal. Appendix surgically absent by history. Diverticulosis of descending and sigmoid colon without evidence of diverticulitis. Stomach and bowel loops otherwise normal appearance. Prostatic enlargement gland measuring 6.2 x 4.6 cm image 96, containing areas of heterogeneous high and low attenuation, nonspecific. Bladder and ureters normal. Scattered atherosclerotic calcifications without aortic aneurysm. No mass, adenopathy, free air or free fluid. Scattered degenerative changes thoracolumbar spine without acute bony findings. IMPRESSION: Multiple splenic infarcts with evidence of thrombus within the splenic vein at the splenic hilum. Distal colonic diverticulosis without evidence of diverticulitis. Prostatic enlargement. Cholelithiasis. Findings called to Dr. SAncil Boozeron 11/27/2015 at 1724 hr. Electronically Signed   By: MLavonia DanaM.D.   On: 11/27/2015 17:24      ASSESSMENT AND PLAN:   Principal Problem:   Splenic vein thrombosis Active Problems:   Essential hypertension   Hyperlipemia   Gastroesophageal reflux disease with esophagitis   Arthritis, degenerative  1.splenic vein thrombosis: Etiology not known at this time. No history of chronic pancreatitis, no abdominal mass. Start Eliquis..Marland Kitchen/c heaprin Discussed the risks and benefits of anticoagulation. Patient wants Eliquis. Patient is seen by vascular, CT chest with the contrast.check echo and Holter as out pt for eval of paroxysmal afib, Lab Results  Component Value Date   AFPTM 0.8 11/28/2015    io 2.HTN; Controlled. 3 GERD continue PPIs. Hyperlipidemia continue statins. BPH continue Flomax.  D/wpatient and patient's daughter.  Recent diverticulitis: Finished antibiotics.  All recent diverticulitis resolved symptoms resolvedthe records are reviewed and case discussed with Care Management/Social Workerr. Management plans discussed with the patient, family and they are in agreement.  CODE STATUS: full  TOTAL TIME TAKING CARE OF THIS PATIENT: 375mutes.   POSSIBLE D/C IN 1-2 DAYS, DEPENDING ON CLINICAL CONDITION.   KOEpifanio Lesches.D on 11/29/2015 at 12:28 PM  Between 7am to 6pm - Pager - [phone]  After 6pm go to [email or web address] - password [email or web address]  Office  [phone]: Primary care physician; LeAshok NorrisMD

## 2015-11-29 NOTE — Progress Notes (Signed)
*  PRELIMINARY RESULTS* Echocardiogram 2D Echocardiogram has been performed.  Jose Castro 11/29/2015, 7:44 PM

## 2015-11-29 NOTE — Progress Notes (Signed)
[facility]  Daily Progress Note   Subjective  -   Left upper quadrant and flank pain significantly improved  Objective Filed Vitals:   11/29/15 0539 11/29/15 0944 11/29/15 1235 11/29/15 2028  BP: 138/58 130/60 120/61 155/72  Pulse: 74  65 67  Temp: 99.6 F (37.6 C)  98.4 F (36.9 C) 97.4 F (36.3 C)  TempSrc: Oral  Oral Oral  Resp: '17  17 16  '$ Height:      Weight:      SpO2: 94%  97% 99%    Intake/Output Summary (Last 24 hours) at 11/29/15 2134 Last data filed at 11/29/15 2044  Gross per 24 hour  Intake 1212.04 ml  Output      0 ml  Net 1212.04 ml    PULM  Normal effort , no use of accessory muscles CV  No JVD, RRR Abd      No distended, nontender VASC  palpable pulses throughout  Laboratory CBC    Component Value Date/Time   WBC 11.9* 11/29/2015 0459   WBC 15.7* 11/22/2015 1515   WBC 10.1 11/26/2013 0953   HGB 14.5 11/29/2015 0459   HGB 14.6 11/26/2013 0953   HCT 43.3 11/29/2015 0459   HCT 48.8 11/22/2015 1515   HCT 43.0 11/26/2013 0953   PLT 316 11/29/2015 0459   PLT 260 11/26/2013 0953    BMET    Component Value Date/Time   NA 137 11/28/2015 0438   NA 143 10/22/2015 0954   NA 138 11/27/2013 0819   K 3.6 11/28/2015 0438   K 4.1 11/27/2013 0819   CL 106 11/28/2015 0438   CL 107 11/27/2013 0819   CO2 23 11/28/2015 0438   CO2 23 11/27/2013 0819   GLUCOSE 101* 11/28/2015 0438   GLUCOSE 110* 10/22/2015 0954   GLUCOSE 101* 11/27/2013 0819   BUN 25* 11/28/2015 0438   BUN 20 10/22/2015 0954   BUN 22* 11/27/2013 0819   CREATININE 1.01 11/28/2015 0438   CREATININE 1.26 11/27/2013 0819   CALCIUM 8.0* 11/28/2015 0438   CALCIUM 8.2* 11/27/2013 0819   GFRNONAA >60 11/28/2015 0438   GFRNONAA 52* 11/27/2013 0819   GFRAA >60 11/28/2015 0438   GFRAA 60* 11/27/2013 0819    Assessment/Planning:  1. Splenic vein thrombosis: Unclear cause at this time. 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 Secondly, a ulcerative plaque within the thoracic aorta could be responsible for an embolic shower. Based on these 2 possibilities CT scan of the chest will be obtained and given it will require contrast and he just had a scan with contrast yesterday if it could be delayed for a day or 2 that would be optimal.  Also embolic shower from paroxysmal atrial fibrillation could be an etiology for the splenic infarcts and therefore I would recommend monitoring his cardiac rhythm perhaps he will need a Holter monitor as an outpatient. He will continue his heparin drip he will be transitioned to oral anticoagulation to treat his splenic vein thrombosis. The patient appears to have adequate coverage and Eliquis was therapy will be initiated. He will obtain his CT scan of the chest as an outpatient and see me in the office next week.   2. Essential hypertension: controlled, continue home antihypertensives.   3. Hyperlipemia: continue home statin  4. Gastroesophageal reflux disease with esophagitis: equivalent home dose PPI   5. Arthritis, degenerative: when necessary analgesics   6. Asymptomatic carotid stenosis: Approximately 40% by Dr Marguerite Olea report He will need an ultrasound as an out patient, ASA 81 mg daily    Katha Cabal  11/29/2015, 9:34 PM

## 2015-11-29 NOTE — Care Management (Addendum)
Contacted patient pharmacy [facility] and spoke with pharmacist Altha Harm who ran Eliquis authorization. Patient would have $ 161 co pay because of "donut hole" Gave patient 30 day free trial card and $10 month card. This should carry patient through "donut hole" period. Informed Dr Delana Meyer. Spoke with Altha Harm again at pharmacy e scripts to be filled by pharmacy a. t no cost to patient using 30day trial .

## 2015-11-29 NOTE — Progress Notes (Signed)
ANTICOAGULATION CONSULT NOTE - Initial Consult  Pharmacy Consult for Heparin transition to apixaban Indication: Splenic Vein thrombosis  Allergies  Allergen Reactions  . Penicillins Itching and Swelling  . Gabapentin Hives    GI upset.    Patient Measurements: Height: '5\' 10"'$  (177.8 cm) Weight: 200 lb 11.2 oz (91.037 kg) IBW/kg (Calculated) : 73 Heparin Dosing Weight: 91 kg  Vital Signs: Temp: 98.4 F (36.9 C) (12/29 1235) Temp Source: Oral (12/29 1235) BP: 120/61 mmHg (12/29 1235) Pulse Rate: 65 (12/29 1235)  Labs:  Recent Labs  11/27/15 1500 11/27/15 1652 11/27/15 1958  11/28/15 0438 11/28/15 1342 11/28/15 2204 11/29/15 0459  HGB 14.9  --   --   --  14.0  --   --  14.5  HCT 45.0  --   --   --  41.5  --   --  43.3  PLT 226  --   --   --  236  --   --  316  APTT  --   --  28  --  52*  --   --   --   LABPROT  --   --  15.9*  --  16.2*  --   --   --   INR  --   --  1.26  --  1.29  --   --   --   HEPARINUNFRC  --   --   --   < > 0.19* 0.50 0.45 0.55  CREATININE  --  1.30* 1.14  --  1.01  --   --   --   < > = values in this interval not displayed.  Estimated Creatinine Clearance: 58.5 mL/min (by C-G formula based on Cr of 1.01).   Medical History: Past Medical History  Diagnosis Date  . Hypertension   . GERD (gastroesophageal reflux disease)   . Hyperlipidemia   . BPH (benign prostatic hyperplasia)   . Splenic vein thrombosis     Medications:  Scheduled:  . apixaban  10 mg Oral BID  . [START ON 12/06/2015] apixaban  5 mg Oral BID  . atorvastatin  20 mg Oral Daily  . brimonidine  1 drop Both Eyes BID  . finasteride  5 mg Oral Daily  . lisinopril  20 mg Oral Daily  . pantoprazole  40 mg Oral Daily   Infusions:  . heparin 1,750 Units/hr (11/29/15 0535)    Assessment: Patient currently on Heparin drip at 1750 units/hr with therapeutic heparin level. Patient to be transitioned to apixaban.   Plan:  Will continue heparin drip until 21:00 tonight then  start apixaban '10mg'$  PO bid x 7 days at 22:00.  Paulina Fusi, PharmD, BCPS 11/29/2015 3:07 PM

## 2015-11-30 LAB — CBC
HEMATOCRIT: 41.3 % (ref 40.0–52.0)
Hemoglobin: 13.7 g/dL (ref 13.0–18.0)
MCH: 30.2 pg (ref 26.0–34.0)
MCHC: 33.2 g/dL (ref 32.0–36.0)
MCV: 90.9 fL (ref 80.0–100.0)
PLATELETS: 354 10*3/uL (ref 150–440)
RBC: 4.54 MIL/uL (ref 4.40–5.90)
RDW: 13.3 % (ref 11.5–14.5)
WBC: 10.2 10*3/uL (ref 3.8–10.6)

## 2015-11-30 MED ORDER — APIXABAN 5 MG PO TABS: 10.0000 mg | ORAL_TABLET | Freq: Two times a day (BID) | ORAL | Status: DC

## 2015-11-30 MED ORDER — APIXABAN 5 MG PO TABS: 5.0000 mg | ORAL_TABLET | Freq: Two times a day (BID) | ORAL | Status: DC

## 2015-11-30 NOTE — Discharge Instructions (Signed)
Epixiban 10 mg po bid for 7 days  Only After that 5 mg bid  Starting 1/5

## 2015-11-30 NOTE — Progress Notes (Signed)
Notified MD about differing Eliquis orders for prescription. MD to change

## 2015-11-30 NOTE — Discharge Summary (Signed)
Jose Castro, is a 81 y.o. male  DOB 1926/03/27  MRN [id number].  Admission date:  11/27/2015  Admitting Physician  Lance Coon, MD  Discharge Date:  11/30/2015   Primary MD  Ashok Norris, MD  Recommendations for primary care physician for things to follow:  Patient is to follow-up with Dr.Gregory Schnier in one week,.   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 Hypertension   . GERD (gastroesophageal reflux disease)   . Hyperlipidemia   . BPH (benign prostatic hyperplasia)   . Splenic vein thrombosis     Past Surgical History  Procedure Laterality Date  . Kidney stone surgery    . Appendectomy    . Knee surgery         History of present illness and  Hospital Course:     Kindly see H&P for history of present illness and admission details, please review complete Labs, Consult reports and Test reports for all details in brief  HPI  from the history and physical done on the day of admission 81 year old male patient admitted for abdominal pain and found to have a splenic infarct. Patient recently treated with Cipro for diverticulitis, had CT of abdomen for follow up on that and found to have a splenic infarct and patient is advised to go to ER and he was admitted for the same.   Hospital Course   1.panic infarct: Patient did have a milder left upper quadrant pain: Patient started on IV heparin drip. Romie Minus had a CT of abdomen which  showed normal liver, normal pancreas, normal adrenals, normal kidneys. He had diverticulosis without diverticulitis. Malignancy workup is negative by abdominal CAT scan. Patient also had a ultrasound of abdomen which was negative for gallstones. AFP is 0.8 which is in normal range. Concerning splenic infarct vascular surgery was consulted. Close with the recommended CT chest with contrast which can be done as an outpatient. Patient also had echocardiogram which did not show any valvular abnormality. So we started ELiquis and stopped Heparin drip. Discussed the risks and benefits of anticoagulation. Echo F more than 55%. Follow-up with Dr.Schneir As an outpatient by CT chest results and the Holter monitor should be set up at that time. 2. Essential hypertension controlled #3 .hyperlipidemia;table continue statins #4 BPH continue Flomax.   Discharge Condition: Stable   Follow UP  Follow-up Information    Follow up with Schnier, Dolores Lory, MD. Go on 12/10/2015.   Specialties:  Vascular Surgery, Cardiology, Radiology, Vascular Surgery   Why:  Monday at 2:30pm for hospital follow-up and CT Scan read.   Contact information:   [place] [address] [phone]         Discharge Instructions  and  Discharge Medications  The chest with contrast next week, follow-up with Dr. Delana Meyer  With results     Medication List    STOP taking these medications        ciprofloxacin 250 MG tablet  Commonly known as:  CIPRO      TAKE these medications        apixaban 5 MG Tabs tablet  Commonly  known as:  ELIQUIS  Take 2 tablets (10 mg total) by mouth 2 (two) times daily.     apixaban 5 MG Tabs tablet  Commonly known as:  ELIQUIS  Take 1 tablet (5 mg total) by mouth 2 (two) times daily.  Start taking on:  12/06/2015     atorvastatin 20 MG tablet  Commonly known as:  LIPITOR  Take 1 tablet (20 mg total) by mouth daily.     brimonidine 0.2 % ophthalmic solution  Commonly known  as:  ALPHAGAN  Place 1 drop into both eyes 2 (two) times daily.     calcium-vitamin D 500-200 MG-UNIT tablet  Take 1 tablet by mouth daily.     CIALIS 5 MG tablet  Generic drug:  tadalafil  Take 5 mg by mouth daily.     finasteride 5 MG tablet  Commonly known as:  PROSCAR  Take 5 mg by mouth daily.     lisinopril 20 MG tablet  Commonly known as:  PRINIVIL,ZESTRIL  Take 20 mg by mouth daily.     omega-3 acid ethyl esters 1 g capsule  Commonly known as:  LOVAZA  Take 1 g by mouth daily.     pantoprazole 40 MG tablet  Commonly known as:  PROTONIX  TAKE ONE TABLET EVERY DAY          Diet and Activity recommendation: See Discharge Instructions above   Consults obtained - vascular   Major procedures and Radiology Reports - PLEASE review detailed and final reports for all details, in brief -      Dg Chest 2 View  11/27/2015  CLINICAL DATA:  Abdominal pain, elevated white count. EXAM: CHEST  2 VIEW COMPARISON:  01/02/2014 FINDINGS: Minimal left base atelectasis. Right lung is clear. Heart is normal size. No effusions. No acute bony abnormality. IMPRESSION: Minimal left base atelectasis. Electronically Signed   By: Rolm Baptise M.D.   On: 11/27/2015 15:38   Ct Abdomen Pelvis W Contrast  11/27/2015  CLINICAL DATA:  LEFT lower quadrant abdominal pain for 2 days, CVA tenderness, RIGHT lower quadrant pain, diarrhea hypertension, EXAM: CT ABDOMEN AND PELVIS WITH CONTRAST TECHNIQUE: Multidetector CT imaging of the abdomen and pelvis was performed using the standard protocol following bolus administration of intravenous contrast. Sagittal and coronal MPR images reconstructed from axial data set. CONTRAST:  149m OMNIPAQUE IOHEXOL 300 MG/ML SOLN IV. Dilute oral contrast. COMPARISON:  11/23/2013 FINDINGS: Emphysematous changes with minimal bibasilar atelectasis LEFT greater than RIGHT. Increase in size of spleen versus previous exam with geographic areas of low-attenuation and minimal  perisplenic edema compatible with multiple splenic infarcts. No evidence of splenic rupture/hemorrhage. Splenic artery demonstrates calcifications but appears opacified/patent. Thrombus identified in splenic vein at splenic hilum. Tiny nonspecific low-attenuation focus superiorly lateral segment LEFT lobe liver 6 mm diameter image 25 grossly unchanged. Small hepatic cyst medial segment LEFT lobe liver 10 x 6 mm image 32. Small BILATERAL renal cysts. Dependent gallstones in gallbladder. Remainder of liver, pancreas, kidneys, and adrenal glands normal. Appendix surgically absent by history. Diverticulosis of descending and sigmoid colon without evidence of diverticulitis. Stomach and bowel loops otherwise normal appearance. Prostatic enlargement gland measuring 6.2 x 4.6 cm image 96, containing areas of heterogeneous high and low attenuation, nonspecific. Bladder and ureters normal. Scattered atherosclerotic calcifications without aortic aneurysm. No mass, adenopathy, free air or free fluid. Scattered degenerative changes thoracolumbar spine without acute bony findings. IMPRESSION: Multiple splenic infarcts with evidence of thrombus within the splenic vein at the splenic  hilum. Distal colonic diverticulosis without evidence of diverticulitis. Prostatic enlargement. Cholelithiasis. Findings called to Dr. Ancil Boozer on 11/27/2015 at 1724 hr. Electronically Signed   By: Lavonia Dana M.D.   On: 11/27/2015 17:24    Micro Results     No results found for this or any previous visit (from the past 240 hour(s)).     Today   Subjective:   Stephenson Cichy today has no headache,no chest abdominal pain,no new weakness tingling or numbness, feels much better wants to go home today.   Objective:   Blood pressure 152/71, pulse 68, temperature 98.2 F (36.8 C), temperature source Oral, resp. rate 18, height '5\' 10"'$  (1.778 m), weight 91.037 kg (200 lb 11.2 oz), SpO2 93 %.   Intake/Output Summary (Last 24 hours) at  11/30/15 1338 Last data filed at 11/30/15 1000  Gross per 24 hour  Intake 981.04 ml  Output      0 ml  Net 981.04 ml    Exam Awake Alert, Oriented x 3, No new F.N deficits, Normal affect [place].AT,PERRAL Supple Neck,No JVD, No cervical lymphadenopathy appriciated.  Symmetrical Chest wall movement, Good air movement bilaterally, CTAB RRR,No Gallops,Rubs or new Murmurs, No Parasternal Heave +ve B.Sounds, Abd Soft, Non tender, No organomegaly appriciated, No rebound -guarding or rigidity. No Cyanosis, Clubbing or edema, No new Rash or bruise  Data Review   CBC w Diff: Lab Results  Component Value Date   WBC 10.2 11/30/2015   WBC 15.7* 11/22/2015   WBC 10.1 11/26/2013   HGB 13.7 11/30/2015   HGB 14.6 11/26/2013   HCT 41.3 11/30/2015   HCT 48.8 11/22/2015   HCT 43.0 11/26/2013   PLT 354 11/30/2015   PLT 260 11/26/2013   LYMPHOPCT 18 11/28/2015   LYMPHOPCT 13.8 11/26/2013   MONOPCT 14 11/28/2015   MONOPCT 9.3 11/26/2013   EOSPCT 2 11/28/2015   EOSPCT 1.5 11/26/2013   BASOPCT 1 11/28/2015   BASOPCT 0.7 11/26/2013    CMP: Lab Results  Component Value Date   NA 137 11/28/2015   NA 143 10/22/2015   NA 138 11/27/2013   K 3.6 11/28/2015   K 4.1 11/27/2013   CL 106 11/28/2015   CL 107 11/27/2013   CO2 23 11/28/2015   CO2 23 11/27/2013   BUN 25* 11/28/2015   BUN 20 10/22/2015   BUN 22* 11/27/2013   CREATININE 1.01 11/28/2015   CREATININE 1.26 11/27/2013   PROT 6.5 11/27/2015   PROT 6.6 10/22/2015   PROT 7.2 11/26/2013   ALBUMIN 3.3* 11/27/2015   ALBUMIN 4.2 10/22/2015   ALBUMIN 3.3* 11/26/2013   BILITOT 1.2 11/27/2015   BILITOT 0.7 10/22/2015   BILITOT 0.5 11/26/2013   ALKPHOS 54 11/27/2015   ALKPHOS 68 11/26/2013   AST 12* 11/27/2015   AST 21 11/26/2013   ALT 10* 11/27/2015   ALT 18 11/26/2013  .   Total Time in preparing paper work, data evaluation and todays exam - 4 minutes  Kazue Cerro M.D on 11/30/2015 at 1:38 PM    Note: This dictation was  prepared with Dragon dictation along with smaller phrase technology. Any transcriptional errors that result from this process are unintentional.

## 2015-11-30 NOTE — Progress Notes (Signed)
Pt alert and oriented. Family very distraught about medications regimen and discharge orders. Reassured pt that paper work will be printed and given to pt when the MD adjusts the prescriptions. Pt family was concerned about pt receiving Eliquis. Gave pt Eliquis and additional medications. Pt ambulated with staff and family (per preference) to vehicle for discharge. Concerns addressed. IV site removed.

## 2015-12-02 DIAGNOSIS — I8290 Acute embolism and thrombosis of unspecified vein: Secondary | ICD-10-CM

## 2015-12-02 HISTORY — DX: Acute embolism and thrombosis of unspecified vein: I82.90

## 2015-12-05 ENCOUNTER — Telehealth: Payer: Self-pay | Admitting: Family Medicine

## 2015-12-05 ENCOUNTER — Other Ambulatory Visit: Payer: Self-pay | Admitting: Family Medicine

## 2015-12-05 DIAGNOSIS — D735 Infarction of spleen: Secondary | ICD-10-CM

## 2015-12-06 ENCOUNTER — Ambulatory Visit: Admission: RE | Admit: 2015-12-06 | Payer: Medicare Other | Source: Ambulatory Visit

## 2015-12-06 ENCOUNTER — Ambulatory Visit: Payer: Medicare Other

## 2015-12-12 NOTE — Telephone Encounter (Signed)
ERRENOUS °

## 2015-12-14 ENCOUNTER — Ambulatory Visit
Admission: RE | Admit: 2015-12-14 | Discharge: 2015-12-14 | Disposition: A | Discharge status: Home / Self Care | Payer: Medicare Other | Source: Ambulatory Visit | Attending: Family Medicine | Admitting: Family Medicine

## 2015-12-14 ENCOUNTER — Telehealth: Payer: Self-pay | Admitting: Emergency Medicine

## 2015-12-14 DIAGNOSIS — I700 Atherosclerosis of aorta: Secondary | ICD-10-CM | POA: Diagnosis not present

## 2015-12-14 DIAGNOSIS — I2510 Atherosclerotic heart disease of native coronary artery without angina pectoris: Secondary | ICD-10-CM | POA: Insufficient documentation

## 2015-12-14 DIAGNOSIS — K8020 Calculus of gallbladder without cholecystitis without obstruction: Secondary | ICD-10-CM | POA: Diagnosis not present

## 2015-12-14 DIAGNOSIS — D735 Infarction of spleen: Secondary | ICD-10-CM | POA: Diagnosis not present

## 2015-12-14 MED ORDER — IOHEXOL 350 MG/ML SOLN
100.0000 mL | Freq: Once | INTRAVENOUS | Status: AC | PRN
  Administered 2015-12-14: 100 mL via INTRAVENOUS

## 2015-12-14 NOTE — Telephone Encounter (Signed)
Patient notified to make appointment to discuss CT result.

## 2015-12-17 ENCOUNTER — Ambulatory Visit (INDEPENDENT_AMBULATORY_CARE_PROVIDER_SITE_OTHER): Payer: Medicare Other | Admitting: Family Medicine

## 2015-12-17 ENCOUNTER — Encounter: Payer: Self-pay | Admitting: Family Medicine

## 2015-12-17 VITALS — BP 128/60 | HR 58 | Temp 98.9°F | Resp 18 | Ht 70.0 in | Wt 200.4 lb

## 2015-12-17 DIAGNOSIS — M109 Gout, unspecified: Secondary | ICD-10-CM | POA: Diagnosis not present

## 2015-12-17 DIAGNOSIS — I82409 Acute embolism and thrombosis of unspecified deep veins of unspecified lower extremity: Secondary | ICD-10-CM | POA: Diagnosis not present

## 2015-12-17 DIAGNOSIS — I2510 Atherosclerotic heart disease of native coronary artery without angina pectoris: Secondary | ICD-10-CM

## 2015-12-17 DIAGNOSIS — D735 Infarction of spleen: Secondary | ICD-10-CM

## 2015-12-17 DIAGNOSIS — D689 Coagulation defect, unspecified: Secondary | ICD-10-CM

## 2015-12-17 DIAGNOSIS — R0989 Other specified symptoms and signs involving the circulatory and respiratory systems: Secondary | ICD-10-CM

## 2015-12-17 DIAGNOSIS — I700 Atherosclerosis of aorta: Secondary | ICD-10-CM

## 2015-12-17 DIAGNOSIS — Z8679 Personal history of other diseases of the circulatory system: Secondary | ICD-10-CM | POA: Diagnosis not present

## 2015-12-17 NOTE — Progress Notes (Signed)
Additional labs was order that was not in system Protein S, Protein C, Lupus Antibody, PT-Ptt

## 2015-12-17 NOTE — Patient Instructions (Signed)
Uric Acid Nephropathy Uric acid is a chemical compound that is made when your body digests some kinds of food and also when your body breaks down dead cells. It is a waste product that is normally removed from your body by your kidneys. If you have too much uric acid in your blood, it can build up in your kidneys and cause damage (nephropathy). Uric acid nephropathy can be sudden (acute) or long-term (chronic). The acute type results from a sudden buildup of uric acid. This can happen if you have cancer or receive drug treatment for cancer (chemotherapy) that makes you lose cells rapidly. The cell breakdown produces excess uric acid. As uric acid builds up in your kidneys, it causes an increase of pressure and a loss of blood supply. This makes your kidneys less able to filter blood and make urine. Symptoms of acute uric acid nephropathy may start within days of starting chemotherapy. Chronic uric acid nephropathy may happen if you have gout. This disease forms excess uric acid into crystals. These crystals can get stuck inside of joints and cause painful swelling. They may also build up in your kidneys and cause long-term damage. CAUSES Uric acid nephropathy is caused by having too much uric acid from your diet or from cell breakdown. RISK FACTORS You may be at greater risk for uric acid nephropathy if you:  Are male.  Are 80 years old or older.  Take medicine that can cause excess uric acid. Examples are aspirin, water pills, and medicines that are prescribed after an organ transplant.  Eat a lot of foods that are high in certain natural chemical compounds (purines). Shellfish and red meat contain a lot of purines.  Have gout.  Drink too much alcohol.  Are having chemotherapy.  Have frequent seizures.  Have severe diarrhea, which causes fluid loss (dehydration). SIGNS AND SYMPTOMS Signs and symptoms depend on the type of nephropathy that you have. They may include:  Decreased urine  output.  Nausea and vomiting.  Lack of energy.  Seizures.  High blood pressure.  Kidney infection.  Blood-tinged urine.  Pain when passing urine.  Pain in the sides of the lower back (flank pain). DIAGNOSIS Your health care provider may suspect uric acid nephropathy from your signs and symptoms, especially if you have gout. A physical exam will be done. Tests may be done to confirm the diagnosis, such as:  Blood and urine tests. This is the best way to measure high levels of uric acid.  Imaging studies to check for kidney stones or kidney damage. These may include:  X-rays.  A test that uses sound waves to create images (ultrasound).  CT scan.  MRI. TREATMENT The goal of treatment is to lower the level of uric acid in your body and prevent kidney damage. This can be done by:  Taking medicines that block uric acid production.  Several medicines can block the production of uric acid as a waste product. The most commonly used medicine is allopurinol.  If you are starting chemotherapy treatment, ask your health care provider if you should start taking a medicine to prevent high uric acid.  Starting a diet plan to lower your intake of purines. Working with a Financial planner, such as a Firefighter, can help you to follow a healthy diet and limit your intake of foods and drinks that increase uric acid.  Preventing uric acid buildup. This can be done by drinking lots of water to maintain a good flow of urine and  lower the acidity of your urine. You may also need to take medicine called bicarbonate.  Resting the kidneys by using a machine to filter your blood (hemodialysis), if necessary.  You may need hemodialysis if your kidneys are not working well because of uric acid damage.  In hemodialysis, your blood is removed, passed through a filtering machine, and then returned to your body.  Several sessions of hemodialysis usually improve kidney function by  removing uric acid. HOME CARE INSTRUCTIONS  Take medicines only as directed by your health care provider.  Drink enough fluid to keep your urine clear or pale yellow.  Do not drink alcohol.  Do not drink beverages that contain the sugar fructose.  Limit how much red meat and shellfish you eat.  Include plenty of low-fat dairy foods in your diet.  Maintain a healthy weight. Lose weight as directed by your health care provider. SEEK MEDICAL CARE IF:  You feel tired and have low energy, even when you get enough sleep.  You have pain when passing urine.  You have nausea or vomiting. SEEK IMMEDIATE MEDICAL CARE IF:  You produce very little urine, even when you drink enough fluids.  You have blood in your urine.  You have a seizure.   This information is not intended to replace advice given to you by your health care provider. Make sure you discuss any questions you have with your health care provider.   Document Released: 09/14/2007 Document Revised: 12/08/2014 Document Reviewed: 04/26/2014 Elsevier Interactive Patient Education [employer].

## 2015-12-18 ENCOUNTER — Other Ambulatory Visit: Payer: Self-pay | Admitting: Family Medicine

## 2015-12-19 ENCOUNTER — Other Ambulatory Visit: Payer: Self-pay

## 2015-12-19 ENCOUNTER — Encounter: Payer: Self-pay | Admitting: Family Medicine

## 2015-12-19 MED ORDER — PANTOPRAZOLE SODIUM 40 MG PO TBEC: 40.0000 mg | DELAYED_RELEASE_TABLET | Freq: Every day | ORAL | Status: DC

## 2015-12-19 NOTE — Addendum Note (Signed)
Addended by: Ashok Norris on: 12/19/2015 09:34 AM   Modules accepted: Level of Service

## 2015-12-19 NOTE — Progress Notes (Signed)
Name: ZACKORY PUDLO   MRN: [id number]    DOB: [birth year]   Date:12/19/2015       Progress Note  Subjective  Chief Complaint  Chief Complaint  Patient presents with  . Hospitalization Follow-up    HPI  Follow-up hospitalization  Patient was hospitalized with chest and abdominal pain. He had some abdominal pain which was treated appropriately for diverticulitis. Assistance of symptoms led to CT scanning of the abdomen and the tinnitus. There were noted to be splenic infarctions as well as DVT of the spleen. In addition coronary artery calcifications were noted as well as thoracic aorta calcifications without significant aneurysm. There is note made in the CT note of a mural thrombus. He was placed on Eliquis while in the hospital and is being followed by cardiologist and artificial vascular surgeon as well. There've been no history of any coagulopathy in the past.  Past Medical History  Diagnosis Date  . Hypertension   . GERD (gastroesophageal reflux disease)   . Hyperlipidemia   . BPH (benign prostatic hyperplasia)   . Splenic vein thrombosis     Social History  Substance Use Topics  . Smoking status: Former Smoker -- 16 years    Types: Cigarettes    Start date: 10/21/1944    Quit date: 10/21/1960  . Smokeless tobacco: Never Used  . Alcohol Use: 0.0 oz/week    0 Standard drinks or equivalent per week     Current outpatient prescriptions:  .  apixaban (ELIQUIS) 5 MG TABS tablet, Take 2 tablets (10 mg total) by mouth 2 (two) times daily., Disp: 14 tablet, Rfl: 0 .  apixaban (ELIQUIS) 5 MG TABS tablet, Take 1 tablet (5 mg total) by mouth 2 (two) times daily., Disp: 60 tablet, Rfl: 0 .  brimonidine (ALPHAGAN) 0.2 % ophthalmic solution, Place 1 drop into both eyes 2 (two) times daily. , Disp: , Rfl:  .  Calcium Carbonate-Vitamin D (CALCIUM-VITAMIN D) 500-200 MG-UNIT per tablet, Take 1 tablet by mouth daily., Disp: , Rfl:  .  CIALIS 5 MG tablet, Take 5 mg by mouth daily. , Disp: ,  Rfl:  .  finasteride (PROSCAR) 5 MG tablet, Take 5 mg by mouth daily. , Disp: , Rfl:  .  lisinopril (PRINIVIL,ZESTRIL) 20 MG tablet, TAKE ONE TABLET TWICE DAILY, Disp: 60 tablet, Rfl: 7 .  omega-3 acid ethyl esters (LOVAZA) 1 g capsule, Take 1 g by mouth daily., Disp: , Rfl:  .  pantoprazole (PROTONIX) 40 MG tablet, TAKE ONE TABLET EVERY DAY, Disp: 90 tablet, Rfl: 0  Allergies  Allergen Reactions  . Penicillins Itching and Swelling  . Gabapentin Hives    GI upset.    Review of Systems  Constitutional: Positive for fever and chills. Negative for weight loss.  HENT: Negative for congestion, hearing loss, sore throat and tinnitus.   Eyes: Negative for blurred vision, double vision and redness.  Respiratory: Negative for cough, hemoptysis and shortness of breath.   Cardiovascular: Negative for chest pain, palpitations, orthopnea, claudication and leg swelling.  Gastrointestinal: Positive for abdominal pain. Negative for heartburn, nausea, vomiting, diarrhea, constipation and blood in stool.  Genitourinary: Negative for dysuria, urgency, frequency and hematuria.  Musculoskeletal: Positive for back pain. Negative for myalgias, joint pain, falls and neck pain.  Skin: Negative for itching.  Neurological: Negative for dizziness, tingling, tremors, focal weakness, seizures, loss of consciousness, weakness and headaches.  Endo/Heme/Allergies: Does not bruise/bleed easily.  Psychiatric/Behavioral: Negative for depression and substance abuse. The patient is not nervous/anxious  and does not have insomnia.      Objective  Filed Vitals:   12/17/15 1222  BP: 128/60  Pulse: 58  Temp: 98.9 F (37.2 C)  Resp: 18  Height: 5' 10"  (1.778 m)  Weight: 200 lb 7 oz (90.918 kg)  SpO2: 97%     Physical Exam  Constitutional: He is oriented to person, place, and time and well-developed, well-nourished, and in no distress.  HENT:  Head: Normocephalic.  Eyes: EOM are normal. Pupils are equal, round,  and reactive to light.  Neck: Normal range of motion. Neck supple. No thyromegaly present.  Cardiovascular: Normal rate, regular rhythm and normal heart sounds.   No murmur heard. Pulmonary/Chest: Effort normal and breath sounds normal. No respiratory distress. He has no wheezes.  Abdominal: Soft. Bowel sounds are normal. There is tenderness (tenderness diffusely).  Musculoskeletal: Normal range of motion. He exhibits no edema.  Lymphadenopathy:    He has no cervical adenopathy.  Neurological: He is alert and oriented to person, place, and time. No cranial nerve deficit. Gait normal. Coordination normal.  Skin: Skin is warm and dry. No rash noted.  Psychiatric: Affect and judgment normal.      Assessment & Plan  1. Splenic infarction Noted on CT scan  He will need hematologic workup - CBC w/Diff/Platelet - ANCA TITERS - Sed Rate (ESR)  2. Suspected deep vein thrombosis (DVT) (HCC) Noted on CT scan of the spleen - CBC w/Diff/Platelet - ANCA TITERS - Sed Rate (ESR) - Ambulatory referral to Hematology  3. ASCVD (arteriosclerotic cardiovascular disease) Noted on CT scan the patient is asymptomatic - CBC w/Diff/Platelet - ANCA TITERS - Sed Rate (ESR) - PTT - INR/PT - Ambulatory referral to Hematology  4. Gout, unspecified cause, unspecified chronicity, unspecified site Obtain uric acid level - Uric acid  5. Coagulopathy ([place]) Labs as above and referral to hematologist  6. Atherosclerosis of aorta (HCC) Or for high cholesterol and blood pressure control and these are in Eliquis  7. History of cardiovascular calcifications As above

## 2015-12-21 LAB — LUPUS ANTICOAGULANT PANEL
APTT: 28.2 s
AVAILABLE: 11.8 s — AB
Anticardiolipin Ab, IgG: <10 [GPL'U]
Beta-2 Glycoprotein I, IgA: <10 SAU
Beta-2 Glycoprotein I, IgM: <10 SMU
DRVVT Confirm Seconds: 61.5 s
DRVVT RATIO: 1.3 ratio — AB
DRVVT Screen Seconds: 90.5 s — ABNORMAL HIGH
HEXAGONAL PHOSPHOLIPID NEUTRAL: 15 s — AB
INR: 1.1 ratio
PLATELET NEUTRALIZATION: 0 s
THROMBIN TIME: 15.9 s

## 2015-12-21 LAB — CBC WITH DIFFERENTIAL/PLATELET
BASOS: 1 %
Basophils Absolute: 0.1 10*3/uL (ref 0.0–0.2)
EOS (ABSOLUTE): 0.2 10*3/uL (ref 0.0–0.4)
Eos: 3 %
Hematocrit: 44.9 % (ref 37.5–51.0)
Hemoglobin: 15.1 g/dL (ref 12.6–17.7)
Immature Grans (Abs): 0 10*3/uL (ref 0.0–0.1)
Immature Granulocytes: 0 %
LYMPHS: 24 %
Lymphocytes Absolute: 2.2 10*3/uL (ref 0.7–3.1)
MCH: 29.8 pg (ref 26.6–33.0)
MCHC: 33.6 g/dL (ref 31.5–35.7)
MCV: 89 fL (ref 79–97)
MONOS ABS: 0.9 10*3/uL (ref 0.1–0.9)
Monocytes: 10 %
NEUTROS ABS: 5.7 10*3/uL (ref 1.4–7.0)
NEUTROS PCT: 62 %
PLATELETS: 395 10*3/uL — AB (ref 150–379)
RBC: 5.07 x10E6/uL (ref 4.14–5.80)
RDW: 13.2 % (ref 12.3–15.4)
WBC: 9.2 10*3/uL (ref 3.4–10.8)

## 2015-12-21 LAB — SEDIMENTATION RATE: SED RATE: 2 mm/h (ref 0–30)

## 2015-12-21 LAB — PROTEIN S, TOTAL AND FREE
PROTEIN S AG TOTAL: 134 % (ref 60–150)
Protein S Ag, Free: 88 % (ref 57–157)

## 2015-12-21 LAB — ANCA TITERS
Atypical pANCA: <1:20 {titer}
C-ANCA: <1:20 {titer}
P-ANCA: <1:20 {titer}

## 2015-12-21 LAB — PT AND PTT
INR: 1.1 (ref 0.8–1.2)
Prothrombin Time: 11.2 s (ref 9.1–12.0)
aPTT: 29 s (ref 24–33)

## 2015-12-21 LAB — PROTEIN C, TOTAL: PROTEIN C ANTIGEN: 84 % (ref 60–150)

## 2015-12-21 LAB — URIC ACID: Uric Acid: 5.3 mg/dL (ref 3.7–8.6)

## 2015-12-24 ENCOUNTER — Telehealth: Payer: Self-pay | Admitting: Emergency Medicine

## 2015-12-24 NOTE — Telephone Encounter (Signed)
Patient notified

## 2015-12-24 NOTE — Telephone Encounter (Signed)
Patient notified. Appointment with Hematologist on Wednesday

## 2015-12-26 ENCOUNTER — Encounter: Payer: Self-pay | Admitting: Internal Medicine

## 2015-12-26 ENCOUNTER — Inpatient Hospital Stay: Payer: Medicare Other | Attending: Internal Medicine | Admitting: Internal Medicine

## 2015-12-26 VITALS — BP 164/82 | HR 65 | Temp 98.8°F | Resp 18 | Ht 70.0 in | Wt 200.4 lb

## 2015-12-26 DIAGNOSIS — I82890 Acute embolism and thrombosis of other specified veins: Secondary | ICD-10-CM

## 2015-12-26 DIAGNOSIS — K8020 Calculus of gallbladder without cholecystitis without obstruction: Secondary | ICD-10-CM

## 2015-12-26 DIAGNOSIS — N400 Enlarged prostate without lower urinary tract symptoms: Secondary | ICD-10-CM

## 2015-12-26 DIAGNOSIS — K7689 Other specified diseases of liver: Secondary | ICD-10-CM | POA: Insufficient documentation

## 2015-12-26 DIAGNOSIS — I10 Essential (primary) hypertension: Secondary | ICD-10-CM | POA: Diagnosis not present

## 2015-12-26 DIAGNOSIS — K5790 Diverticulosis of intestine, part unspecified, without perforation or abscess without bleeding: Secondary | ICD-10-CM

## 2015-12-26 DIAGNOSIS — Z79899 Other long term (current) drug therapy: Secondary | ICD-10-CM | POA: Insufficient documentation

## 2015-12-26 DIAGNOSIS — I2510 Atherosclerotic heart disease of native coronary artery without angina pectoris: Secondary | ICD-10-CM

## 2015-12-26 DIAGNOSIS — E785 Hyperlipidemia, unspecified: Secondary | ICD-10-CM | POA: Diagnosis not present

## 2015-12-26 DIAGNOSIS — Z809 Family history of malignant neoplasm, unspecified: Secondary | ICD-10-CM

## 2015-12-26 DIAGNOSIS — K219 Gastro-esophageal reflux disease without esophagitis: Secondary | ICD-10-CM | POA: Insufficient documentation

## 2015-12-26 DIAGNOSIS — N281 Cyst of kidney, acquired: Secondary | ICD-10-CM | POA: Diagnosis not present

## 2015-12-26 DIAGNOSIS — Z87891 Personal history of nicotine dependence: Secondary | ICD-10-CM

## 2015-12-26 DIAGNOSIS — D735 Infarction of spleen: Secondary | ICD-10-CM | POA: Insufficient documentation

## 2015-12-26 DIAGNOSIS — Z7901 Long term (current) use of anticoagulants: Secondary | ICD-10-CM | POA: Diagnosis not present

## 2015-12-26 NOTE — Patient Instructions (Signed)
Before coming in on June 02, 2016 stop your eliquis for 3 days prior to lab draw.

## 2015-12-26 NOTE — Progress Notes (Signed)
[place] @ [facility] [phone]  Fax:(336) [place]: 1926-08-20  MR#: [id number]  SPQ#:[id number]  Patient Care Team: Ashok Norris, MD as PCP - General (Family Medicine)  CHIEF COMPLAINT: No chief complaint on file.    No history exists.    No flowsheet data found.  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 He was treated with heparin, and then converted to eliquis, which she has been taken since then. In mid- January he underwent hypercoagulable workup, which revealed positivity for lupus anticoagulant. Until this episode Jose Castro had not had any history of blood clots, including MRIs or strokes. He does not have any history of abnormal blood counts, although, around the time when he developed his symptoms, his white blood cell count was elevated to 15,000 with preponderance of neutrophils. He is referred to our clinic for recommendations regarding further management.  Currently Jose Castro feels well. He does not have any abdominal pain, nausea, vomiting, diarrhea, bleeding from any source, weight loss. His colonoscopy was done in 2009 and he was told that he did not need another one. His PSA is within normal range.  REVIEW OF SYSTEMS:   Review of Systems  All other systems reviewed and are negative.    PAST MEDICAL HISTORY: Past Medical History  Diagnosis Date  . Hypertension   . GERD (gastroesophageal reflux disease)   . Hyperlipidemia   . BPH (benign prostatic hyperplasia)   . Splenic vein thrombosis     PAST SURGICAL HISTORY: Past Surgical History  Procedure Laterality Date  .  Kidney stone surgery    . Appendectomy    . Knee surgery      FAMILY HISTORY Family History  Problem Relation Age of Onset  . Cancer Brother   . Heart disease Mother     ADVANCED DIRECTIVES:  No flowsheet data found.  HEALTH MAINTENANCE: Social History  Substance Use Topics  . Smoking status: Former Smoker -- 16 years    Types: Cigarettes    Start date: 10/21/1944    Quit date: 10/21/1960  . Smokeless tobacco: Never Used  . Alcohol Use: 0.0 oz/week    0 Standard drinks or equivalent per week     Allergies  Allergen Reactions  . Penicillins Itching and Swelling  . Gabapentin Hives    GI upset.    Current Outpatient Prescriptions  Medication Sig Dispense Refill  . apixaban (ELIQUIS) 5 MG TABS tablet Take 2 tablets (10 mg total) by mouth 2 (two) times daily. 14 tablet 0  . apixaban (ELIQUIS) 5 MG TABS tablet Take 1 tablet (5 mg total) by mouth 2 (two) times daily. 60 tablet 0  . brimonidine (ALPHAGAN) 0.2 % ophthalmic solution Place 1 drop into both eyes 2 (two) times daily.     . Calcium Carbonate-Vitamin D (CALCIUM-VITAMIN D) 500-200 MG-UNIT per tablet Take 1 tablet by mouth daily.    Marland Kitchen CIALIS 5 MG tablet Take 5 mg by mouth daily.     . finasteride (PROSCAR) 5 MG tablet Take 5 mg by mouth daily.     Marland Kitchen lisinopril (PRINIVIL,ZESTRIL) 20 MG tablet TAKE ONE TABLET TWICE DAILY 60 tablet 7  .  omega-3 acid ethyl esters (LOVAZA) 1 g capsule Take 1 g by mouth daily.    . pantoprazole (PROTONIX) 40 MG tablet Take 1 tablet (40 mg total) by mouth daily. 90 tablet 0   No current facility-administered medications for this visit.    OBJECTIVE:  Filed Vitals:   12/26/15 1416  BP: 164/82  Pulse: 65  Temp: 98.8 F (37.1 C)  Resp: 18     Body mass index is 28.75 kg/(m^2).    ECOG FS:0 - Asymptomatic  Physical Exam  Constitutional: He is oriented to person, place, and time and well-developed, well-nourished, and in no distress. No distress.  Younger than stated age 13  male  HENT:  Head: Normocephalic and atraumatic.  Right Ear: External ear normal.  Left Ear: External ear normal.  Nose: Nose normal.  Mouth/Throat: Oropharynx is clear and moist. No oropharyngeal exudate.  Eyes: Conjunctivae and EOM are normal. Pupils are equal, round, and reactive to light. Right eye exhibits no discharge. Left eye exhibits no discharge. No scleral icterus.  Neck: Normal range of motion. Neck supple. No JVD present. No tracheal deviation present. No thyromegaly present.  Cardiovascular: Normal rate, regular rhythm, normal heart sounds and intact distal pulses.  Exam reveals no gallop and no friction rub.   No murmur heard. Pulmonary/Chest: Effort normal and breath sounds normal. No stridor. No respiratory distress. He has no wheezes. He has no rales. He exhibits no tenderness.  Abdominal: Soft. Bowel sounds are normal. He exhibits no distension and no mass. There is no tenderness. There is no rebound and no guarding.  Genitourinary:  Patient deferred  Musculoskeletal: Normal range of motion. He exhibits no edema or tenderness.  Lymphadenopathy:    He has no cervical adenopathy.  Neurological: He is alert and oriented to person, place, and time. He has normal reflexes. No cranial nerve deficit. He exhibits normal muscle tone. Gait normal. Coordination normal. GCS score is 15.  Skin: Skin is warm and dry. No rash noted. He is not diaphoretic. No erythema. No pallor.  Psychiatric: Mood, memory, affect and judgment normal.  Nursing note and vitals reviewed.    LAB RESULTS:  CBC Latest Ref Rng 12/17/2015 11/30/2015  WBC 3.4 - 10.8 x10E3/uL 9.2 10.2  Hemoglobin 13.0 - 18.0 g/dL - 13.7  Hematocrit 37.5 - 51.0 % 44.9 41.3  Platelets 150 - 379 x10E3/uL 395(H) 354    No visits with results within 5 Day(s) from this visit. Latest known visit with results is:  Office Visit on 12/17/2015  Component Date Value Ref Range Status  . WBC 12/17/2015 9.2  3.4 - 10.8 x10E3/uL  Final  . RBC 12/17/2015 5.07  4.14 - 5.80 x10E6/uL Final  . Hemoglobin 12/17/2015 15.1  12.6 - 17.7 g/dL Final  . Hematocrit 12/17/2015 44.9  37.5 - 51.0 % Final  . MCV 12/17/2015 89  79 - 97 fL Final  . MCH 12/17/2015 29.8  26.6 - 33.0 pg Final  . MCHC 12/17/2015 33.6  31.5 - 35.7 g/dL Final  . RDW 12/17/2015 13.2  12.3 - 15.4 % Final  . Platelets 12/17/2015 395* 150 - 379 x10E3/uL Final  . Neutrophils 12/17/2015 62   Final  . Lymphs 12/17/2015 24   Final  . Monocytes 12/17/2015 10   Final  . Eos 12/17/2015 3   Final  . Basos 12/17/2015 1   Final  . Neutrophils Absolute 12/17/2015 5.7  1.4 - 7.0 x10E3/uL Final  . Lymphocytes Absolute 12/17/2015 2.2  0.7 - 3.1  x10E3/uL Final  . Monocytes Absolute 12/17/2015 0.9  0.1 - 0.9 x10E3/uL Final  . EOS (ABSOLUTE) 12/17/2015 0.2  0.0 - 0.4 x10E3/uL Final  . Basophils Absolute 12/17/2015 0.1  0.0 - 0.2 x10E3/uL Final  . Immature Granulocytes 12/17/2015 0   Final  . Immature Grans (Abs) 12/17/2015 0.0  0.0 - 0.1 x10E3/uL Final  . C-ANCA 12/17/2015 <1:20  Neg:<1:20 titer Final  . P-ANCA 12/17/2015 <1:20  Neg:<1:20 titer Final   Comment: The presence of positive fluorescence exhibiting P-ANCA or C-ANCA patterns alone is not specific for the diagnosis of Wegener's Granulomatosis (WG) or microscopic polyangiitis. Decisions about treatment should not be based solely on ANCA IFA results.  The International ANCA Group Consensus recommends follow up testing of positive sera with both PR-3 and MPO-ANCA enzyme immunoassays. As many as 5% serum samples are positive only by EIA. Ref. AM J Clin Pathol 1999;111:507-513.   Marland Kitchen Atypical pANCA 12/17/2015 <1:20  Neg:<1:20 titer Final   Comment: The atypical pANCA pattern has been observed in a significant percentage of patients with ulcerative colitis, primary sclerosing cholangitis and autoimmune hepatitis.   . Sed Rate 12/17/2015 2  0 - 30 mm/hr Final  . Uric Acid 12/17/2015 5.3  3.7 - 8.6 mg/dL Final               Therapeutic target for gout patients: <6.0  . Prothrombin Time 12/17/2015 11.8*  Final   Comment: Reference Range: 18 years and older: 9.6 - 11.5   . INR 12/17/2015 1.1   Final   Comment: Reference Range: 18 years and older: 0.9 - 1.1   . APTT 12/17/2015 28.2   Final   Comment: This test has not been validated for monitoring unfractionated heparin therapy.  aPTT-based therapeutic ranges for unfractionated heparin therapy have not been established.  Consider ordering Heparin anti-Xa (unfractionated). Reference Range: 18 years and older: 22.9 - 30.2   . APTT 1:1 NP 12/17/2015 CANCELED   Final-Edited   Comment: Testing Not Indicated Not indicated  Result canceled by the ancillary   . APTT 1:1 Saline 12/17/2015 CANCELED   Final-Edited   Comment: Testing Not Indicated Not indicated  Result canceled by the ancillary   . Thrombin Time 12/17/2015 15.9   Final   Comment: Reference Range: 0.0 - 20.9   . DRVVT Screen Seconds 12/17/2015 90.5*  Final   Comment: Reference Range: 0.0 - 44.0   . DRVVT Confirm Seconds 12/17/2015 61.5   Final  . DRVVT Ratio 12/17/2015 1.3*  Final   Comment: Testing while the patient is on anticoagulant therapy, including warfarin, dabigatran, or a direct Xa inhibitor may cause a false positive result. Reference Range: 0.8 - 1.2   . Hexagonal Phospholipid Neutral 12/17/2015 15*  Final   Comment: This value is POSITIVE. This is a qualitative assay and is therefore reported as positive for lupus anticoagulant or negative.  Testing while the patient is on anticoagulant therapy, including warfarin, dabigatran, or a direct Xa inhibitor may cause a false positive result.  The quantitative value is provided as an aid in diagnosis. Reference Range: 0 - 11 This test was developed and its performance characteristics determined by [facility]. It has not been cleared or approved by the Food and Drug Administration.   . Platelet Neutralization  12/17/2015 0.0   Final   Comment: Reference Range: 0.0 - 3.0 This test was developed and its performance characteristics determined by [facility]. It has not been cleared or approved by the Food and Drug Administration.   Marland Kitchen  Anticardiolipin Ab, IgG 12/17/2015 <10   Final   Comment: Reference Range: Negative: <15 Indeterminate: 15 - 20 Low to medium positive: >20 - 80 High positive: >80   . Anticardiolipin Ab, IgM 12/17/2015 <10   Final   Comment: Reference Range: Negative: <13 Indeterminate: 13 - 20 Low to medium positive: >20 - 80 High positive: >80   . Beta-2 Glycoprotein I, IgG 12/17/2015 <10   Final   Comment: The reference interval reflects a 3SD or 99th percentile interval, which is thought to represent a potentially clinically significant result in accordance with the International Consensus Statement on the classification criteria for definitive antiphospholipid syndrome (APS). J Thromb Haem2006;4:295-306. Reference Range: Negative: <21   . Beta-2 Glycoprotein I, IgM 12/17/2015 <10   Final   Comment: The reference interval reflects a 3SD or 99th percentile interval, which is thought to represent a potentially clinically significant result in accordance with the International Consensus Statement on the classification criteria for definitive antiphospholipid syndrome (APS). J Thromb Haem2006;4:295-306. Reference Range: Negative: <33   . Beta-2 Glycoprotein I, IgA 12/17/2015 <10   Final   Comment: The reference interval reflects a 3SD or 99th percentile interval, which is thought to represent a potentially clinically significant result in accordance with the International Consensus Statement on the classification criteria for definitive antiphospholipid syndrome (APS). J Thromb Haem2006;4:295-306. Reference Range: Negative: <26   . LAC Interpretation 12/17/2015 Comment   Final   Comment: The following interpretation for the presence of a lupus anticoagulant (LA)  is not valid for patients receiving warfarin, direct Xa inhibitor (e.g., rivaroxaban, apixaban) or direct thrombin inhibitor (e.g., dabigatran) therapy. These drugs may cause false positive lupus anticoagulant (LA) results but will not interfere with aCL and B2GP1 antibody testing. A lupus anticoagulant is detected. This interpretation is made based on ISTH guidelines for LA diagnosis (J Thromb Haemost. 2009; 7: 1737-40). As only persistent LA meet laboratory diagnostic criteria for antiphospholipid syndrome, repeat testing in 12 or more weeks is recommended, ideally in the absence of anticoagulant therapy. All ELISA-based antiphospholipid antibodies evaluated are normal. Please contact [facility] if further clarification is needed.   . INR 12/17/2015 1.1  0.8 - 1.2 Final   Comment: Reference interval is for non-anticoagulated patients. Suggested INR therapeutic range for Vitamin K antagonist therapy:    Standard Dose (moderate intensity                   therapeutic range):       2.0 - 3.0    Higher intensity therapeutic range       2.5 - 3.5   . Prothrombin Time 12/17/2015 11.2  9.1 - 12.0 sec Final  . aPTT 12/17/2015 29  24 - 33 sec Final   Comment: This test has not been validated for monitoring unfractionated heparin therapy. aPTT-based therapeutic ranges for unfractionated heparin therapy have not been established. For general guidelines on Heparin monitoring, refer to the [employer].   . Protein S Ag, Total 12/17/2015 134  60 - 150 % Final  . Protein S Ag, Free 12/17/2015 88  57 - 157 % Final  . Protein C Antigen 12/17/2015 84  60 - 150 % Final    UDIES: Dg Chest 2 View  11/27/2015  CLINICAL DATA:  Abdominal pain, elevated white count. EXAM: CHEST  2 VIEW COMPARISON:  01/02/2014 FINDINGS: Minimal left base atelectasis. Right lung is clear. Heart is normal size. No effusions. No acute bony abnormality. IMPRESSION: Minimal left base  atelectasis. Electronically Signed   By: Rolm Baptise M.D.   On: 11/27/2015 15:38   Ct Abdomen Pelvis W Contrast  11/27/2015  CLINICAL DATA:  LEFT lower quadrant abdominal pain for 2 days, CVA tenderness, RIGHT lower quadrant pain, diarrhea hypertension, EXAM: CT ABDOMEN AND PELVIS WITH CONTRAST TECHNIQUE: Multidetector CT imaging of the abdomen and pelvis was performed using the standard protocol following bolus administration of intravenous contrast. Sagittal and coronal MPR images reconstructed from axial data set. CONTRAST:  170m OMNIPAQUE IOHEXOL 300 MG/ML SOLN IV. Dilute oral contrast. COMPARISON:  11/23/2013 FINDINGS: Emphysematous changes with minimal bibasilar atelectasis LEFT greater than RIGHT. Increase in size of spleen versus previous exam with geographic areas of low-attenuation and minimal perisplenic edema compatible with multiple splenic infarcts. No evidence of splenic rupture/hemorrhage. Splenic artery demonstrates calcifications but appears opacified/patent. Thrombus identified in splenic vein at splenic hilum. Tiny nonspecific low-attenuation focus superiorly lateral segment LEFT lobe liver 6 mm diameter image 25 grossly unchanged. Small hepatic cyst medial segment LEFT lobe liver 10 x 6 mm image 32. Small BILATERAL renal cysts. Dependent gallstones in gallbladder. Remainder of liver, pancreas, kidneys, and adrenal glands normal. Appendix surgically absent by history. Diverticulosis of descending and sigmoid colon without evidence of diverticulitis. Stomach and bowel loops otherwise normal appearance. Prostatic enlargement gland measuring 6.2 x 4.6 cm image 96, containing areas of heterogeneous high and low attenuation, nonspecific. Bladder and ureters normal. Scattered atherosclerotic calcifications without aortic aneurysm. No mass, adenopathy, free air or free fluid. Scattered degenerative changes thoracolumbar spine without acute bony findings. IMPRESSION: Multiple splenic infarcts with  evidence of thrombus within the splenic vein at the splenic hilum. Distal colonic diverticulosis without evidence of diverticulitis. Prostatic enlargement. Cholelithiasis. Findings called to Dr. SAncil Boozeron 11/27/2015 at 1724 hr. Electronically Signed   By: MLavonia DanaM.D.   On: 11/27/2015 17:24   Ct Angio Chest Aorta W/cm &/or Wo/cm  12/14/2015  CLINICAL DATA:  Pt here to evaluate thoracic aorta. Pt states he has a blood clot in his spleen and his MD is looking for the source of the clots. Pt denies hx of ca or surgery to chest. EXAM: CT ANGIOGRAPHY CHEST WITH CONTRAST TECHNIQUE: Multidetector CT imaging of the chest was performed using the standard protocol during bolus administration of intravenous contrast. Multiplanar CT image reconstructions and MIPs were obtained to evaluate the vascular anatomy. CONTRAST:  1043mOMNIPAQUE IOHEXOL 350 MG/ML SOLN COMPARISON:  10/22/2009 and chest x-ray 11/27/2015, a CT of the abdomen and pelvis 11/27/2015 FINDINGS: Heart: There is significant coronary artery calcification. No pericardial effusion. Heart size is normal. Vascular structures: Marked atherosclerotic calcification of the thoracic aorta, not associated with aneurysm or dissection. There is mural thrombus, especially along the distal aortic arch. Pulmonary arteries are grossly well opacified. Mediastinum/thyroid: The visualized portion of the thyroid gland has a normal appearance. No mediastinal, hilar, or axillary adenopathy. Lungs/Airways: Vague areas of scarring identified within the lungs bilaterally, stable since prior study and consistent with benign process. No suspicious pulmonary nodules are identified. There are no pleural effusions or pulmonary infiltrates. Upper abdomen: There are multiple wedge-shaped defects within the spleen which is only partially imaged on this study of the chest. Small amount of perisplenic stranding is also noted. There is atherosclerotic calcification identified at the origin of  the celiac axis and superior mesenteric arteries. There is moderate atherosclerosis within the splenic artery itself although the splenic artery is grossly opacified with intravenous contrast. Visualized abdominal aorta is not aneurysmal. No evidence for  abdominal aortic dissection. There are bilateral low-attenuation lesions in the kidneys. Small hyperdense lesion in the right kidney could represent a hyperdense cyst. These are not fully characterized. Layering gallstones or sludge identified within the gallbladder. Chest wall/osseous structures: Moderate mid thoracic spondylosis. Remote rib fractures. Visualized osseous structures have a normal appearance. Review of the MIP images confirms the above findings. IMPRESSION: 1. No evidence for thoracic aortic dissection. Moderate thoracic aorta atherosclerosis is identified however, predominantly it involving the distal aortic arch region. 2. Splenic infarcts. 3. Coronary artery disease. 4. Multiple small renal lesions, not fully characterized on the study of the chest. 5. Layering gallstones or sludge within the gallbladder. Electronically Signed   By: Nolon Nations M.D.   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 Splenic vein thrombosis is seen in patients with liver cirrhosis, significant abdominal infection, advanced malignancy, myeloproliferative neoplasms. He does not appear to have any of the above mentioned conditions. He will need to complete at least 6 months of treatment with eliquis if he is able to tolerate it well. Once he completes 6 months of treatment, we will recheck his lupus anticoagulant, which is often falsely positive in patients, aching directed to thrombin inhibitors such as eliquis. We also will check him for prothrombin gene mutation, factor V Leiden and protein C protein S deficiency. Also, patients with splenic vein thrombosis could  have [id number] mutation present even without a clear hematologic signs of myeloproliferative neoplasm (I suspect that observed mild thrombocytosis and leukocytosis are due to stress/acute process), so we will check [id number] mutation status. If any of the above-mentioned tests returned positive, he will need lifelong anticoagulation. It is difficult to tell whether splenic vein thrombosis is responsible for splenic infarction, or he also had arterial thrombosis, with embolization of the spleen. In any case, double anticoagulation with eliquis and aspirin would be excessive.  Patient expressed understanding and was in agreement with this plan. He also understands that He can call clinic at any time with any questions, concerns, or complaints.    No matching staging information was found for the patient.  Roxana Hires, MD   12/26/2015 1:52 PM

## 2015-12-26 NOTE — Progress Notes (Signed)
Pt has some back discomfort and then it all ended up with pt being admitted and had scan that showed mural thrombus and splenic vein thrombosis. He is not having chest pain, or difficulty breathing at all.

## 2015-12-31 ENCOUNTER — Encounter: Payer: Self-pay | Admitting: Family Medicine

## 2015-12-31 ENCOUNTER — Ambulatory Visit (INDEPENDENT_AMBULATORY_CARE_PROVIDER_SITE_OTHER): Payer: Medicare Other | Admitting: Family Medicine

## 2015-12-31 VITALS — BP 118/58 | HR 83 | Temp 97.9°F | Resp 16 | Wt 203.6 lb

## 2015-12-31 DIAGNOSIS — D735 Infarction of spleen: Secondary | ICD-10-CM | POA: Insufficient documentation

## 2015-12-31 DIAGNOSIS — I10 Essential (primary) hypertension: Secondary | ICD-10-CM | POA: Diagnosis not present

## 2015-12-31 DIAGNOSIS — I82890 Acute embolism and thrombosis of other specified veins: Secondary | ICD-10-CM

## 2015-12-31 DIAGNOSIS — E785 Hyperlipidemia, unspecified: Secondary | ICD-10-CM

## 2015-12-31 DIAGNOSIS — K8020 Calculus of gallbladder without cholecystitis without obstruction: Secondary | ICD-10-CM | POA: Insufficient documentation

## 2015-12-31 NOTE — Progress Notes (Signed)
Name: Jose Castro   MRN: [id number]    DOB: 1926-03-08   Date:12/31/2015       Progress Note  Subjective  Chief Complaint  Chief Complaint  Patient presents with  . 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 Seen by Hematologist - Dr. Rudean Hitt, and it may be secondary to lupus anticoagulant syndrome. He is aware of risk of bleeding.   HTN: his bp is towards the low end of normal, he denies dizziness or headache. No chest pain or palpitation  Hyperlipidemia: Discussed importance of 150 minutes of physical activity weekly, eat two servings of fish weekly, eat one serving of tree nuts ( cashews, pistachios, pecans, almonds.Marland Kitchen) every other day, eat 6 servings of fruit/vegetables daily and drink plenty of water and avoid sweet beverages. He is not taking statin therapy.    Patient Active Problem List   Diagnosis Date Noted  . Gallstone 12/31/2015  . 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 Arteriopathy ([place]) 11/22/2015  . Carotid artery narrowing 11/22/2015  . Diverticulosis of colon 11/22/2015  . Decreased creatinine clearance 11/22/2015  . Arthritis, degenerative 11/22/2015  . Lactose intolerance 11/22/2015  . Basal cell carcinoma 11/22/2015  . Left-sided low back pain with left-sided sciatica 06/21/2015  . Essential hypertension 06/21/2015  . Hyperlipemia 06/21/2015  . Gastroesophageal reflux disease with esophagitis 06/21/2015  . Calculus of kidney 11/18/2013    Past Surgical History  Procedure Laterality Date  . Kidney stone surgery    . Appendectomy    . Knee surgery    . Spine surgery      Family History  Problem Relation Age of Onset  . Cancer Brother     bladder cancer with mets  . Heart disease Mother   . Aortic aneurysm Mother   . Heart attack Maternal Aunt   . Heart attack Maternal Uncle     Social History    Social History  . Marital Status: Widowed    Spouse Name: N/A  . Number of Children: N/A  . Years of Education: N/A   Occupational History  . Not on file.   Social History Main Topics  . Smoking status: Former Smoker -- 16 years    Types: Cigarettes    Start date: 10/21/1944    Quit date: 10/21/1960  . Smokeless tobacco: Never Used  . Alcohol Use: 0.0 oz/week    0 Standard drinks or equivalent per week     Comment: once a week when he goes out to dinner-socially  . Drug Use: No  . Sexual Activity:    Partners: Female   Other Topics Concern  . Not on file   Social History Narrative     Current outpatient prescriptions:  .  apixaban (ELIQUIS) 5 MG TABS tablet, Take 1 tablet (5 mg total) by mouth 2 (two) times daily., Disp: 60 tablet, Rfl: 0 .  brimonidine (ALPHAGAN) 0.2 % ophthalmic solution, Place 1 drop into both eyes 2 (two) times daily. , Disp: , Rfl:  .  Cholecalciferol (VITAMIN D3) 2000 units TABS, Take 1 tablet by mouth daily., Disp: , Rfl:  .  CIALIS 5 MG tablet, Take 5 mg by mouth daily. , Disp: , Rfl:  .  finasteride (PROSCAR) 5 MG tablet, Take 5 mg by mouth daily. , Disp: , Rfl:  .  lisinopril (  PRINIVIL,ZESTRIL) 20 MG tablet, TAKE ONE TABLET TWICE DAILY, Disp: 60 tablet, Rfl: 7 .  omega-3 acid ethyl esters (LOVAZA) 1 g capsule, Take 1 g by mouth daily., Disp: , Rfl:  .  Omega-3 Fatty Acids (FISH OIL) 1000 MG CAPS, Take 1 capsule by mouth daily., Disp: , Rfl:  .  pantoprazole (PROTONIX) 40 MG tablet, Take 1 tablet (40 mg total) by mouth daily., Disp: 90 tablet, Rfl: 0  Allergies  Allergen Reactions  . Penicillins Itching and Swelling  . Gabapentin Hives    GI upset.     ROS  Constitutional: Negative for fever or weight change.  Respiratory: Negative for cough and shortness of breath.   Cardiovascular: Negative for chest pain or palpitations.  Gastrointestinal: Negative for abdominal pain, no bowel changes.  Musculoskeletal: Negative for gait problem  or joint swelling.  Skin: Negative for rash.  Neurological: Negative for dizziness or headache.  No other specific complaints in a complete review of systems (except as listed in HPI above).  Objective  Filed Vitals:   12/31/15 1000  BP: 118/58  Pulse: 83  Temp: 97.9 F (36.6 C)  TempSrc: Oral  Resp: 16  Weight: 203 lb 9.6 oz (92.352 kg)  SpO2: 99%    Body mass index is 29.21 kg/(m^2).  Physical Exam  Constitutional: Patient appears well-developed and well-nourished. Obese  No distress.  HEENT: head atraumatic, normocephalic, pupils equal and reactive to light,neck supple, throat within normal limits Cardiovascular: Normal rate, regular rhythm and normal heart sounds.  No murmur heard. No BLE edema. Pulmonary/Chest: Effort normal and breath sounds normal. No respiratory distress. Abdominal: Soft.  There is no tenderness. No masses Psychiatric: Patient has a normal mood and affect. behavior is normal. Judgment and thought content normal.  Recent Results (from the past 2160 hour(s))  Comprehensive metabolic panel     Status: Abnormal   Collection Time: 10/22/15  9:54 AM  Result Value Ref Range   Glucose 110 (H) 65 - 99 mg/dL   BUN 20 8 - 27 mg/dL   Creatinine, Ser 1.09 0.76 - 1.27 mg/dL   GFR calc non Af Amer 61 >59 mL/min/1.73   GFR calc Af Amer 70 >59 mL/min/1.73   BUN/Creatinine Ratio 18 10 - 22   Sodium 143 136 - 144 mmol/L   Potassium 4.9 3.5 - 5.2 mmol/L   Chloride 105 97 - 106 mmol/L   CO2 21 18 - 29 mmol/L   Calcium 9.5 8.6 - 10.2 mg/dL   Total Protein 6.6 6.0 - 8.5 g/dL   Albumin 4.2 3.5 - 4.7 g/dL   Globulin, Total 2.4 1.5 - 4.5 g/dL   Albumin/Globulin Ratio 1.8 1.1 - 2.5   Bilirubin Total 0.7 0.0 - 1.2 mg/dL   Alkaline Phosphatase 71 39 - 117 IU/L   AST 11 0 - 40 IU/L   ALT 10 0 - 44 IU/L  Lipid panel     Status: Abnormal   Collection Time: 10/22/15  9:54 AM  Result Value Ref Range   Cholesterol, Total 158 100 - 199 mg/dL   Triglycerides 170 (H) 0 -  149 mg/dL   HDL 37 (L) >39 mg/dL   VLDL Cholesterol Cal 34 5 - 40 mg/dL   LDL Calculated 87 0 - 99 mg/dL   Chol/HDL Ratio 4.3 0.0 - 5.0 ratio units    Comment:  T. Chol/HDL Ratio                                             Men  Women                               1/2 Avg.Risk  3.4    3.3                                   Avg.Risk  5.0    4.4                                2X Avg.Risk  9.6    7.1                                3X Avg.Risk 23.4   11.0   TSH     Status: None   Collection Time: 10/22/15  9:54 AM  Result Value Ref Range   TSH 3.060 0.450 - 4.500 uIU/mL  CBC with Differential/Platelet     Status: Abnormal   Collection Time: 11/22/15  3:15 PM  Result Value Ref Range   WBC 15.7 (H) 3.4 - 10.8 x10E3/uL   RBC 5.57 4.14 - 5.80 x10E6/uL   Hemoglobin 17.1 12.6 - 17.7 g/dL   Hematocrit 48.8 37.5 - 51.0 %   MCV 88 79 - 97 fL   MCH 30.7 26.6 - 33.0 pg   MCHC 35.0 31.5 - 35.7 g/dL   RDW 13.3 12.3 - 15.4 %   Platelets 166 150 - 379 x10E3/uL   Neutrophils 75 %   Lymphs 13 %   Monocytes 12 %   Eos 0 %   Basos 0 %   Neutrophils Absolute 11.6 (H) 1.4 - 7.0 x10E3/uL   Lymphocytes Absolute 2.1 0.7 - 3.1 x10E3/uL   Monocytes Absolute 1.9 (H) 0.1 - 0.9 x10E3/uL   EOS (ABSOLUTE) 0.0 0.0 - 0.4 x10E3/uL   Basophils Absolute 0.0 0.0 - 0.2 x10E3/uL   Immature Granulocytes 0 %   Immature Grans (Abs) 0.0 0.0 - 0.1 x10E3/uL   Hematology Comments: Note:     Comment: Verified by microscopic examination.  Hemoglobin A1c     Status: Abnormal   Collection Time: 11/22/15  3:15 PM  Result Value Ref Range   Hgb A1c MFr Bld 5.7 (H) 4.8 - 5.6 %    Comment:          Pre-diabetes: 5.7 - 6.4          Diabetes: >6.4          Glycemic control for adults with diabetes: <7.0    Est. average glucose Bld gHb Est-mCnc 117 mg/dL  POC Hemoccult Bld/Stl (1-Cd Office Dx)     Status: Normal   Collection Time: 11/22/15  3:29 PM  Result Value Ref Range   Card #1 Date  11/22/2015    Fecal Occult Blood, POC Negative Negative  CBC with Differential/Platelet     Status: Abnormal   Collection Time: 11/27/15  3:00 PM  Result Value Ref Range   WBC 14.1 (H) 3.8 - 10.6 K/uL   RBC 5.02  4.40 - 5.90 MIL/uL   Hemoglobin 14.9 13.0 - 18.0 g/dL   HCT 45.0 40.0 - 52.0 %   MCV 89.7 80.0 - 100.0 fL   MCH 29.7 26.0 - 34.0 pg   MCHC 33.1 32.0 - 36.0 g/dL   RDW 13.3 11.5 - 14.5 %   Platelets 226 150 - 440 K/uL   Neutrophils Relative % 71 %   Neutro Abs 10.0 (H) 1.4 - 6.5 K/uL   Lymphocytes Relative 12 %   Lymphs Abs 1.7 1.0 - 3.6 K/uL   Monocytes Relative 15 %   Monocytes Absolute 2.0 (H) 0.2 - 1.0 K/uL   Eosinophils Relative 1 %   Eosinophils Absolute 0.2 0 - 0.7 K/uL   Basophils Relative 1 %   Basophils Absolute 0.1 0 - 0.1 K/uL  I-STAT creatinine     Status: Abnormal   Collection Time: 11/27/15  4:52 PM  Result Value Ref Range   Creatinine, Ser 1.30 (H) 0.61 - 1.24 mg/dL  APTT     Status: None   Collection Time: 11/27/15  7:58 PM  Result Value Ref Range   aPTT 28 24 - 36 seconds  Comprehensive metabolic panel     Status: Abnormal   Collection Time: 11/27/15  7:58 PM  Result Value Ref Range   Sodium 136 135 - 145 mmol/L   Potassium 4.2 3.5 - 5.1 mmol/L   Chloride 101 101 - 111 mmol/L   CO2 27 22 - 32 mmol/L   Glucose, Bld 104 (H) 65 - 99 mg/dL   BUN 28 (H) 6 - 20 mg/dL   Creatinine, Ser 1.14 0.61 - 1.24 mg/dL   Calcium 8.4 (L) 8.9 - 10.3 mg/dL   Total Protein 6.5 6.5 - 8.1 g/dL   Albumin 3.3 (L) 3.5 - 5.0 g/dL   AST 12 (L) 15 - 41 U/L   ALT 10 (L) 17 - 63 U/L   Alkaline Phosphatase 54 38 - 126 U/L   Total Bilirubin 1.2 0.3 - 1.2 mg/dL   GFR calc non Af Amer 56 (L) >60 mL/min   GFR calc Af Amer >60 >60 mL/min    Comment: (NOTE) The eGFR has been calculated using the CKD EPI equation. This calculation has not been validated in all clinical situations. eGFR's persistently <60 mL/min signify possible Chronic Kidney Disease.    Anion gap 8 5 - 15   Lipase, blood     Status: None   Collection Time: 11/27/15  7:58 PM  Result Value Ref Range   Lipase 38 11 - 51 U/L  Protime-INR     Status: Abnormal   Collection Time: 11/27/15  7:58 PM  Result Value Ref Range   Prothrombin Time 15.9 (H) 11.4 - 15.0 seconds   INR 1.26   Heparin level (unfractionated)     Status: Abnormal   Collection Time: 11/28/15  4:38 AM  Result Value Ref Range   Heparin Unfractionated 0.19 (L) 0.30 - 0.70 IU/mL    Comment:        IF HEPARIN RESULTS ARE BELOW EXPECTED VALUES, AND PATIENT DOSAGE HAS BEEN CONFIRMED, SUGGEST FOLLOW UP TESTING OF ANTITHROMBIN III LEVELS.   Basic metabolic panel     Status: Abnormal   Collection Time: 11/28/15  4:38 AM  Result Value Ref Range   Sodium 137 135 - 145 mmol/L   Potassium 3.6 3.5 - 5.1 mmol/L   Chloride 106 101 - 111 mmol/L   CO2 23 22 - 32 mmol/L   Glucose, Bld  101 (H) 65 - 99 mg/dL   BUN 25 (H) 6 - 20 mg/dL   Creatinine, Ser 4.98 0.61 - 1.24 mg/dL   Calcium 8.0 (L) 8.9 - 10.3 mg/dL   GFR calc non Af Amer >60 >60 mL/min   GFR calc Af Amer >60 >60 mL/min    Comment: (NOTE) The eGFR has been calculated using the CKD EPI equation. This calculation has not been validated in all clinical situations. eGFR's persistently <60 mL/min signify possible Chronic Kidney Disease.    Anion gap 8 5 - 15  Protime-INR     Status: Abnormal   Collection Time: 11/28/15  4:38 AM  Result Value Ref Range   Prothrombin Time 16.2 (H) 11.4 - 15.0 seconds   INR 1.29   APTT     Status: Abnormal   Collection Time: 11/28/15  4:38 AM  Result Value Ref Range   aPTT 52 (H) 24 - 36 seconds    Comment:        IF BASELINE aPTT IS ELEVATED, SUGGEST PATIENT RISK ASSESSMENT BE USED TO DETERMINE APPROPRIATE ANTICOAGULANT THERAPY.   CBC with Differential/Platelet     Status: Abnormal   Collection Time: 11/28/15  4:38 AM  Result Value Ref Range   WBC 11.5 (H) 3.8 - 10.6 K/uL   RBC 4.60 4.40 - 5.90 MIL/uL   Hemoglobin 14.0 13.0 - 18.0  g/dL   HCT 26.4 15.8 - 30.9 %   MCV 90.4 80.0 - 100.0 fL   MCH 30.4 26.0 - 34.0 pg   MCHC 33.7 32.0 - 36.0 g/dL   RDW 40.7 68.0 - 88.1 %   Platelets 236 150 - 440 K/uL   Neutrophils Relative % 65 %   Neutro Abs 7.5 (H) 1.4 - 6.5 K/uL   Lymphocytes Relative 18 %   Lymphs Abs 2.1 1.0 - 3.6 K/uL   Monocytes Relative 14 %   Monocytes Absolute 1.6 (H) 0.2 - 1.0 K/uL   Eosinophils Relative 2 %   Eosinophils Absolute 0.3 0 - 0.7 K/uL   Basophils Relative 1 %   Basophils Absolute 0.1 0 - 0.1 K/uL  Sedimentation rate     Status: Abnormal   Collection Time: 11/28/15  4:38 AM  Result Value Ref Range   Sed Rate 31 (H) 0 - 20 mm/hr  AFP tumor marker     Status: None   Collection Time: 11/28/15  4:38 AM  Result Value Ref Range   AFP-Tumor Marker 0.8 0.0 - 8.3 ng/mL    Comment: (NOTE) Roche ECLIA methodology Performed At: [facility] [address] [place], [place] [id number] Mila Homer MD PF:[id number]   Heparin level (unfractionated)     Status: None   Collection Time: 11/28/15  1:42 PM  Result Value Ref Range   Heparin Unfractionated 0.50 0.30 - 0.70 IU/mL    Comment:        IF HEPARIN RESULTS ARE BELOW EXPECTED VALUES, AND PATIENT DOSAGE HAS BEEN CONFIRMED, SUGGEST FOLLOW UP TESTING OF ANTITHROMBIN III LEVELS.   Heparin level (unfractionated)     Status: None   Collection Time: 11/28/15 10:04 PM  Result Value Ref Range   Heparin Unfractionated 0.45 0.30 - 0.70 IU/mL    Comment:        IF HEPARIN RESULTS ARE BELOW EXPECTED VALUES, AND PATIENT DOSAGE HAS BEEN CONFIRMED, SUGGEST FOLLOW UP TESTING OF ANTITHROMBIN III LEVELS.   Heparin level (unfractionated)     Status: None   Collection Time: 11/29/15  4:59 AM  Result Value Ref Range   Heparin Unfractionated 0.55 0.30 - 0.70 IU/mL    Comment:        IF HEPARIN RESULTS ARE BELOW EXPECTED VALUES, AND PATIENT DOSAGE HAS BEEN CONFIRMED, SUGGEST FOLLOW UP TESTING OF ANTITHROMBIN III LEVELS.   CBC     Status:  Abnormal   Collection Time: 11/29/15  4:59 AM  Result Value Ref Range   WBC 11.9 (H) 3.8 - 10.6 K/uL   RBC 4.87 4.40 - 5.90 MIL/uL   Hemoglobin 14.5 13.0 - 18.0 g/dL   HCT 43.3 40.0 - 52.0 %   MCV 89.1 80.0 - 100.0 fL   MCH 29.9 26.0 - 34.0 pg   MCHC 33.6 32.0 - 36.0 g/dL   RDW 13.7 11.5 - 14.5 %   Platelets 316 150 - 440 K/uL  CBC     Status: None   Collection Time: 11/30/15  5:02 AM  Result Value Ref Range   WBC 10.2 3.8 - 10.6 K/uL   RBC 4.54 4.40 - 5.90 MIL/uL   Hemoglobin 13.7 13.0 - 18.0 g/dL   HCT 41.3 40.0 - 52.0 %   MCV 90.9 80.0 - 100.0 fL   MCH 30.2 26.0 - 34.0 pg   MCHC 33.2 32.0 - 36.0 g/dL   RDW 13.3 11.5 - 14.5 %   Platelets 354 150 - 440 K/uL  CBC w/Diff/Platelet     Status: Abnormal   Collection Time: 12/17/15  2:52 PM  Result Value Ref Range   WBC 9.2 3.4 - 10.8 x10E3/uL   RBC 5.07 4.14 - 5.80 x10E6/uL   Hemoglobin 15.1 12.6 - 17.7 g/dL   Hematocrit 44.9 37.5 - 51.0 %   MCV 89 79 - 97 fL   MCH 29.8 26.6 - 33.0 pg   MCHC 33.6 31.5 - 35.7 g/dL   RDW 13.2 12.3 - 15.4 %   Platelets 395 (H) 150 - 379 x10E3/uL   Neutrophils 62 %   Lymphs 24 %   Monocytes 10 %   Eos 3 %   Basos 1 %   Neutrophils Absolute 5.7 1.4 - 7.0 x10E3/uL   Lymphocytes Absolute 2.2 0.7 - 3.1 x10E3/uL   Monocytes Absolute 0.9 0.1 - 0.9 x10E3/uL   EOS (ABSOLUTE) 0.2 0.0 - 0.4 x10E3/uL   Basophils Absolute 0.1 0.0 - 0.2 x10E3/uL   Immature Granulocytes 0 %   Immature Grans (Abs) 0.0 0.0 - 0.1 x10E3/uL  ANCA TITERS     Status: None   Collection Time: 12/17/15  2:52 PM  Result Value Ref Range   C-ANCA <1:20 Neg:<1:20 titer   P-ANCA <1:20 Neg:<1:20 titer    Comment: The presence of positive fluorescence exhibiting P-ANCA or C-ANCA patterns alone is not specific for the diagnosis of Wegener's Granulomatosis (WG) or microscopic polyangiitis. Decisions about treatment should not be based solely on ANCA IFA results.  The International ANCA Group Consensus recommends follow up testing  of positive sera with both PR-3 and MPO-ANCA enzyme immunoassays. As many as 5% serum samples are positive only by EIA. Ref. AM J Clin Pathol 1999;111:507-513.    Atypical pANCA <1:20 Neg:<1:20 titer    Comment: The atypical pANCA pattern has been observed in a significant percentage of patients with ulcerative colitis, primary sclerosing cholangitis and autoimmune hepatitis.   Sed Rate (ESR)     Status: None   Collection Time: 12/17/15  2:52 PM  Result Value Ref Range   Sed Rate 2 0 - 30 mm/hr  Uric acid  Status: None   Collection Time: 12/17/15  2:52 PM  Result Value Ref Range   Uric Acid 5.3 3.7 - 8.6 mg/dL    Comment:            Therapeutic target for gout patients: <6.0  Lupus Anticoagulant Panel     Status: Abnormal   Collection Time: 12/17/15  2:52 PM  Result Value Ref Range   Prothrombin Time 11.8 (H) sec    Comment: Reference Range: 18 years and older: 9.6 - 11.5    INR 1.1 ratio    Comment: Reference Range: 18 years and older: 0.9 - 1.1    APTT 28.2 sec    Comment: This test has not been validated for monitoring unfractionated heparin therapy.  aPTT-based therapeutic ranges for unfractionated heparin therapy have not been established.  Consider ordering Heparin anti-Xa (unfractionated). Reference Range: 18 years and older: 22.9 - 30.2    APTT 1:1 NP CANCELED sec    Comment: Testing Not Indicated Not indicated  Result canceled by the ancillary    APTT 1:1 [place] CANCELED Sec    Comment: Testing Not Indicated Not indicated  Result canceled by the ancillary    Thrombin Time 15.9 sec    Comment: Reference Range: 0.0 - 20.9    DRVVT Screen Seconds 90.5 (H) sec    Comment: Reference Range: 0.0 - 44.0    DRVVT Confirm Seconds 61.5 sec   DRVVT Ratio 1.3 (H) ratio    Comment: Testing while the patient is on anticoagulant therapy, including warfarin, dabigatran, or a direct Xa inhibitor may cause a false positive result. Reference Range: 0.8 - 1.2     Hexagonal Phospholipid Neutral 15 (H) sec    Comment: This value is POSITIVE. This is a qualitative assay and is therefore reported as positive for lupus anticoagulant or negative.  Testing while the patient is on anticoagulant therapy, including warfarin, dabigatran, or a direct Xa inhibitor may cause a false positive result.  The quantitative value is provided as an aid in diagnosis. Reference Range: 0 - 11 This test was developed and its performance characteristics determined by [facility]. It has not been cleared or approved by the Food and Drug Administration.    Platelet Neutralization 0.0 sec    Comment: Reference Range: 0.0 - 3.0 This test was developed and its performance characteristics determined by [facility]. It has not been cleared or approved by the Food and Drug Administration.    Anticardiolipin Ab, IgG <10 GPL    Comment: Reference Range: Negative: <15 Indeterminate: 15 - 20 Low to medium positive: >20 - 80 High positive: >80    Anticardiolipin Ab, IgM <10 MPL    Comment: Reference Range: Negative: <13 Indeterminate: 13 - 20 Low to medium positive: >20 - 80 High positive: >80    Beta-2 Glycoprotein I, IgG <10 SGU    Comment: The reference interval reflects a 3SD or 99th percentile interval, which is thought to represent a potentially clinically significant result in accordance with the International Consensus Statement on the classification criteria for definitive antiphospholipid syndrome (APS). J Thromb Haem2006;4:295-306. Reference Range: Negative: <21    Beta-2 Glycoprotein I, IgM <10 SMU    Comment: The reference interval reflects a 3SD or 99th percentile interval, which is thought to represent a potentially clinically significant result in accordance with the International Consensus Statement on the classification criteria for definitive antiphospholipid syndrome (APS). J Thromb Haem2006;4:295-306. Reference Range: Negative: <33    Beta-2  Glycoprotein I, IgA <10 SAU  Comment: The reference interval reflects a 3SD or 99th percentile interval, which is thought to represent a potentially clinically significant result in accordance with the International Consensus Statement on the classification criteria for definitive antiphospholipid syndrome (APS). J Thromb Haem2006;4:295-306. Reference Range: Negative: <26    LAC Interpretation Comment     Comment: The following interpretation for the presence of a lupus anticoagulant (LA) is not valid for patients receiving warfarin, direct Xa inhibitor (e.g., rivaroxaban, apixaban) or direct thrombin inhibitor (e.g., dabigatran) therapy. These drugs may cause false positive lupus anticoagulant (LA) results but will not interfere with aCL and B2GP1 antibody testing. A lupus anticoagulant is detected. This interpretation is made based on ISTH guidelines for LA diagnosis (J Thromb Haemost. 2009; 7: 1737-40). As only persistent LA meet laboratory diagnostic criteria for antiphospholipid syndrome, repeat testing in 12 or more weeks is recommended, ideally in the absence of anticoagulant therapy. All ELISA-based antiphospholipid antibodies evaluated are normal. Please contact [facility] if further clarification is needed.   PT and PTT     Status: None   Collection Time: 12/17/15  2:52 PM  Result Value Ref Range   INR 1.1 0.8 - 1.2    Comment: Reference interval is for non-anticoagulated patients. Suggested INR therapeutic range for Vitamin K antagonist therapy:    Standard Dose (moderate intensity                   therapeutic range):       2.0 - 3.0    Higher intensity therapeutic range       2.5 - 3.5    Prothrombin Time 11.2 9.1 - 12.0 sec   aPTT 29 24 - 33 sec    Comment: This test has not been validated for monitoring unfractionated heparin therapy. aPTT-based therapeutic ranges for unfractionated heparin therapy have not been established. For general guidelines  on Heparin monitoring, refer to the [employer].   Protein S, total and free     Status: None   Collection Time: 12/17/15  2:52 PM  Result Value Ref Range   Protein S Ag, Total 134 60 - 150 %   Protein S Ag, Free 88 57 - 157 %  Protein C, total     Status: None   Collection Time: 12/17/15  2:52 PM  Result Value Ref Range   Protein C Antigen 84 60 - 150 %     PHQ2/9: Depression screen [facility] 2/9 12/31/2015 10/22/2015 06/21/2015  Decreased Interest 0 0 0  Down, Depressed, Hopeless 0 0 0  PHQ - 2 Score 0 0 0     Fall Risk: Fall Risk  12/31/2015 10/22/2015 06/21/2015  Falls in the past year? No No No     Functional Status Survey: Is the patient deaf or have difficulty hearing?: No Does the patient have difficulty seeing, even when wearing glasses/contacts?: No Does the patient have difficulty concentrating, remembering, or making decisions?: No Does the patient have difficulty walking or climbing stairs?: No Does the patient have difficulty dressing or bathing?: No Does the patient have difficulty doing errands alone such as visiting a doctor's office or shopping?: No    Assessment & Plan  1. Essential hypertension  Continue medication, he states bp is at goal   2. Splenic vein thrombosis  Continue follow up with hematologist  3. Hyperlipemia  Continue medication

## 2016-02-20 ENCOUNTER — Ambulatory Visit: Payer: Medicare Other | Admitting: Family Medicine

## 2016-03-03 ENCOUNTER — Ambulatory Visit: Payer: Medicare Other | Admitting: Family Medicine

## 2016-03-03 ENCOUNTER — Ambulatory Visit (INDEPENDENT_AMBULATORY_CARE_PROVIDER_SITE_OTHER): Payer: Medicare Other | Admitting: Family Medicine

## 2016-03-03 ENCOUNTER — Encounter: Payer: Self-pay | Admitting: Family Medicine

## 2016-03-03 VITALS — BP 128/64 | HR 65 | Temp 98.9°F | Resp 12 | Ht 70.0 in | Wt 203.2 lb

## 2016-03-03 DIAGNOSIS — K2100 Gastro-esophageal reflux disease with esophagitis, without bleeding: Secondary | ICD-10-CM

## 2016-03-03 DIAGNOSIS — D735 Infarction of spleen: Secondary | ICD-10-CM

## 2016-03-03 DIAGNOSIS — I739 Peripheral vascular disease, unspecified: Secondary | ICD-10-CM | POA: Diagnosis not present

## 2016-03-03 DIAGNOSIS — I82890 Acute embolism and thrombosis of other specified veins: Secondary | ICD-10-CM

## 2016-03-03 DIAGNOSIS — E785 Hyperlipidemia, unspecified: Secondary | ICD-10-CM

## 2016-03-03 DIAGNOSIS — I10 Essential (primary) hypertension: Secondary | ICD-10-CM | POA: Diagnosis not present

## 2016-03-03 MED ORDER — PANTOPRAZOLE SODIUM 40 MG PO TBEC: 40.0000 mg | DELAYED_RELEASE_TABLET | Freq: Every day | ORAL | Status: DC

## 2016-03-03 MED ORDER — LISINOPRIL 20 MG PO TABS: 20.0000 mg | ORAL_TABLET | Freq: Two times a day (BID) | ORAL | Status: DC

## 2016-03-03 NOTE — Progress Notes (Signed)
Name: Jose Castro   MRN: [id number]    DOB: Mar 28, 1926   Date:03/03/2016       Progress Note  Subjective  Chief Complaint  Chief Complaint  Patient presents with  . 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 Rudean Hitt he is supposed to take aspirin, but patient states he is not taking it, he was told to only taking Eliquis - advised him to contact hematologist again. He denies pain on LUQ  Chronic low back pain: stable, has stiffness when he first gets up, but improves as he moves around. Current pain is 1-2/10, but worse when he bends forward. He states sometimes radiates to lower abdomen.   PVD: stable, recently by vascular doctor and does not need follow up, he denies claudication  Hyperlipidemia: taking fish oil and vitmain D only per a research study.   GERD: doing well on Pantoprazole and he does not have heartburn or indigestion  HTN: taking medication as prescribed, no chest pain, no palpitation, or SOB.     Patient Active Problem List   Diagnosis Date Noted  . Peripheral vascular disease of lower extremity ([place]) 03/03/2016  . Gallstone 12/31/2015  . 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 Arteriopathy ([place]) 11/22/2015  . Carotid artery narrowing 11/22/2015  . Diverticulosis of colon 11/22/2015  . Decreased creatinine clearance 11/22/2015  . Arthritis, degenerative 11/22/2015  . Lactose intolerance 11/22/2015  . Basal cell carcinoma 11/22/2015  . Left-sided low back pain with left-sided sciatica 06/21/2015  . Essential hypertension 06/21/2015  . Hyperlipemia 06/21/2015  . Gastroesophageal reflux disease with esophagitis 06/21/2015  . Calculus of kidney 11/18/2013    Past Surgical History  Procedure Laterality Date  . Kidney stone surgery    . Appendectomy    . Knee surgery    . Spine surgery      Family History   Problem Relation Age of Onset  . Cancer Brother     bladder cancer with mets  . Heart disease Mother   . Aortic aneurysm Mother   . Heart attack Maternal Aunt   . Heart attack Maternal Uncle     Social History   Social History  . Marital Status: Widowed    Spouse Name: N/A  . Number of Children: N/A  . Years of Education: N/A   Occupational History  . Not on file.   Social History Main Topics  . Smoking status: Former Smoker -- 16 years    Types: Cigarettes    Start date: 10/21/1944    Quit date: 10/21/1960  . Smokeless tobacco: Never Used  . Alcohol Use: 0.0 oz/week    0 Standard drinks or equivalent per week     Comment: once a week when he goes out to dinner-socially  . Drug Use: No  . Sexual Activity:    Partners: Female   Other Topics Concern  . Not on file   Social History Narrative     Current outpatient prescriptions:  .  apixaban (ELIQUIS) 5 MG TABS tablet, Take 1 tablet (5 mg total) by mouth 2 (two) times daily., Disp: 60 tablet, Rfl: 0 .  brimonidine (ALPHAGAN) 0.2 % ophthalmic solution, Place 1 drop into both eyes 2 (two) times daily. , Disp: , Rfl:  .  Cholecalciferol (VITAMIN D3) 2000 units TABS, Take 1 tablet by mouth daily., Disp: , Rfl:  .  CIALIS 5 MG tablet, Take 5 mg by mouth daily. , Disp: , Rfl:  .  finasteride (PROSCAR) 5 MG tablet, Take 5 mg by mouth daily. , Disp: , Rfl:  .  lisinopril (PRINIVIL,ZESTRIL) 20 MG tablet, Take 1 tablet (20 mg total) by mouth 2 (two) times daily., Disp: 180 tablet, Rfl: 1 .  Omega-3 Fatty Acids (FISH OIL) 1000 MG CAPS, Take 1 capsule by mouth daily., Disp: , Rfl:  .  pantoprazole (PROTONIX) 40 MG tablet, Take 1 tablet (40 mg total) by mouth daily., Disp: 90 tablet, Rfl: 1  Allergies  Allergen Reactions  . Penicillins Itching and Swelling  . Gabapentin Hives    GI upset.     ROS  Ten systems reviewed and is negative except as mentioned in HPI   Objective  Filed Vitals:   03/03/16 1104  BP: 128/64   Pulse: 65  Temp: 98.9 F (37.2 C)  TempSrc: Oral  Resp: 12  Height: _0  (1.778 m)  Weight: 203 lb 3.2 oz (92.171 kg)  SpO2: 95%    Body mass index is 29.16 kg/(m^2).  Physical Exam  Constitutional: Patient appears well-developed and well-nourished. Obese  No distress.  HEENT: head atraumatic, normocephalic, pupils equal and reactive to light, neck supple, throat within normal limits Cardiovascular: Normal rate, regular rhythm and normal heart sounds.  No murmur heard. No BLE edema. Pulmonary/Chest: Effort normal and breath sounds normal. No respiratory distress. Abdominal: Soft.  There is no tenderness. Psychiatric: Patient has a normal mood and affect. behavior is normal. Judgment and thought content normal. Muscular Skeletal: pain during flexion of spine.   Recent Results (from the past 2160 hour(s))  CBC w/Diff/Platelet     Status: Abnormal   Collection Time: 12/17/15  2:52 PM  Result Value Ref Range   WBC 9.2 3.4 - 10.8 x10E3/uL   RBC 5.07 4.14 - 5.80 x10E6/uL   Hemoglobin 15.1 12.6 - 17.7 g/dL   Hematocrit 44.9 37.5 - 51.0 %   MCV 89 79 - 97 fL   MCH 29.8 26.6 - 33.0 pg   MCHC 33.6 31.5 - 35.7 g/dL   RDW 13.2 12.3 - 15.4 %   Platelets 395 (H) 150 - 379 x10E3/uL   Neutrophils 62 %   Lymphs 24 %   Monocytes 10 %   Eos 3 %   Basos 1 %   Neutrophils Absolute 5.7 1.4 - 7.0 x10E3/uL   Lymphocytes Absolute 2.2 0.7 - 3.1 x10E3/uL   Monocytes Absolute 0.9 0.1 - 0.9 x10E3/uL   EOS (ABSOLUTE) 0.2 0.0 - 0.4 x10E3/uL   Basophils Absolute 0.1 0.0 - 0.2 x10E3/uL   Immature Granulocytes 0 %   Immature Grans (Abs) 0.0 0.0 - 0.1 x10E3/uL  ANCA TITERS     Status: None   Collection Time: 12/17/15  2:52 PM  Result Value Ref Range   C-ANCA <1:20 Neg:<1:20 titer   P-ANCA <1:20 Neg:<1:20 titer    Comment: The presence of positive fluorescence exhibiting P-ANCA or C-ANCA patterns alone is not specific for the diagnosis of Wegener's Granulomatosis (WG) or microscopic  polyangiitis. Decisions about treatment should not be based solely on ANCA IFA results.  The International ANCA Group Consensus recommends follow up testing of positive sera with both PR-3 and MPO-ANCA enzyme immunoassays. As many as 5% serum samples are positive only by EIA. Ref. AM J Clin Pathol 1999;111:507-513.    Atypical pANCA <1:20 Neg:<1:20 titer    Comment: The atypical pANCA pattern has been observed in a  significant percentage of patients with ulcerative colitis, primary sclerosing cholangitis and autoimmune hepatitis.   Sed Rate (ESR)     Status: None   Collection Time: 12/17/15  2:52 PM  Result Value Ref Range   Sed Rate 2 0 - 30 mm/hr  Uric acid     Status: None   Collection Time: 12/17/15  2:52 PM  Result Value Ref Range   Uric Acid 5.3 3.7 - 8.6 mg/dL    Comment:            Therapeutic target for gout patients: <6.0  Lupus Anticoagulant Panel     Status: Abnormal   Collection Time: 12/17/15  2:52 PM  Result Value Ref Range   Prothrombin Time 11.8 (H) sec    Comment: Reference Range: 18 years and older: 9.6 - 11.5    INR 1.1 ratio    Comment: Reference Range: 18 years and older: 0.9 - 1.1    APTT 28.2 sec    Comment: This test has not been validated for monitoring unfractionated heparin therapy.  aPTT-based therapeutic ranges for unfractionated heparin therapy have not been established.  Consider ordering Heparin anti-Xa (unfractionated). Reference Range: 18 years and older: 22.9 - 30.2    APTT 1:1 NP CANCELED sec    Comment: Testing Not Indicated Not indicated  Result canceled by the ancillary    APTT 1:1 [place] CANCELED Sec    Comment: Testing Not Indicated Not indicated  Result canceled by the ancillary    Thrombin Time 15.9 sec    Comment: Reference Range: 0.0 - 20.9    DRVVT Screen Seconds 90.5 (H) sec    Comment: Reference Range: 0.0 - 44.0    DRVVT Confirm Seconds 61.5 sec   DRVVT Ratio 1.3 (H) ratio    Comment: Testing while the  patient is on anticoagulant therapy, including warfarin, dabigatran, or a direct Xa inhibitor may cause a false positive result. Reference Range: 0.8 - 1.2    Hexagonal Phospholipid Neutral 15 (H) sec    Comment: This value is POSITIVE. This is a qualitative assay and is therefore reported as positive for lupus anticoagulant or negative.  Testing while the patient is on anticoagulant therapy, including warfarin, dabigatran, or a direct Xa inhibitor may cause a false positive result.  The quantitative value is provided as an aid in diagnosis. Reference Range: 0 - 11 This test was developed and its performance characteristics determined by [facility]. It has not been cleared or approved by the Food and Drug Administration.    Platelet Neutralization 0.0 sec    Comment: Reference Range: 0.0 - 3.0 This test was developed and its performance characteristics determined by [facility]. It has not been cleared or approved by the Food and Drug Administration.    Anticardiolipin Ab, IgG <10 GPL    Comment: Reference Range: Negative: <15 Indeterminate: 15 - 20 Low to medium positive: >20 - 80 High positive: >80    Anticardiolipin Ab, IgM <10 MPL    Comment: Reference Range: Negative: <13 Indeterminate: 13 - 20 Low to medium positive: >20 - 80 High positive: >80    Beta-2 Glycoprotein I, IgG <10 SGU    Comment: The reference interval reflects a 3SD or 99th percentile interval, which is thought to represent a potentially clinically significant result in accordance with the International Consensus Statement on the classification criteria for definitive antiphospholipid syndrome (APS). J Thromb Haem2006;4:295-306. Reference Range: Negative: <21    Beta-2 Glycoprotein I, IgM <10 SMU    Comment:  The reference interval reflects a 3SD or 99th percentile interval, which is thought to represent a potentially clinically significant result in accordance with the International Consensus  Statement on the classification criteria for definitive antiphospholipid syndrome (APS). J Thromb Haem2006;4:295-306. Reference Range: Negative: <33    Beta-2 Glycoprotein I, IgA <10 SAU    Comment: The reference interval reflects a 3SD or 99th percentile interval, which is thought to represent a potentially clinically significant result in accordance with the International Consensus Statement on the classification criteria for definitive antiphospholipid syndrome (APS). J Thromb Haem2006;4:295-306. Reference Range: Negative: <26    LAC Interpretation Comment     Comment: The following interpretation for the presence of a lupus anticoagulant (LA) is not valid for patients receiving warfarin, direct Xa inhibitor (e.g., rivaroxaban, apixaban) or direct thrombin inhibitor (e.g., dabigatran) therapy. These drugs may cause false positive lupus anticoagulant (LA) results but will not interfere with aCL and B2GP1 antibody testing. A lupus anticoagulant is detected. This interpretation is made based on ISTH guidelines for LA diagnosis (J Thromb Haemost. 2009; 7: 1737-40). As only persistent LA meet laboratory diagnostic criteria for antiphospholipid syndrome, repeat testing in 12 or more weeks is recommended, ideally in the absence of anticoagulant therapy. All ELISA-based antiphospholipid antibodies evaluated are normal. Please contact [facility] if further clarification is needed.   PT and PTT     Status: None   Collection Time: 12/17/15  2:52 PM  Result Value Ref Range   INR 1.1 0.8 - 1.2    Comment: Reference interval is for non-anticoagulated patients. Suggested INR therapeutic range for Vitamin K antagonist therapy:    Standard Dose (moderate intensity                   therapeutic range):       2.0 - 3.0    Higher intensity therapeutic range       2.5 - 3.5    Prothrombin Time 11.2 9.1 - 12.0 sec   aPTT 29 24 - 33 sec    Comment: This test has not been  validated for monitoring unfractionated heparin therapy. aPTT-based therapeutic ranges for unfractionated heparin therapy have not been established. For general guidelines on Heparin monitoring, refer to the [employer].   Protein S, total and free     Status: None   Collection Time: 12/17/15  2:52 PM  Result Value Ref Range   Protein S Ag, Total 134 60 - 150 %   Protein S Ag, Free 88 57 - 157 %  Protein C, total     Status: None   Collection Time: 12/17/15  2:52 PM  Result Value Ref Range   Protein C Antigen 84 60 - 150 %     PHQ2/9: Depression screen [facility] 2/9 03/03/2016 12/31/2015 10/22/2015 06/21/2015  Decreased Interest 0 0 0 0  Down, Depressed, Hopeless 0 0 0 0  PHQ - 2 Score 0 0 0 0     Fall Risk: Fall Risk  03/03/2016 12/31/2015 10/22/2015 06/21/2015  Falls in the past year? No No No No      Functional Status Survey: Is the patient deaf or have difficulty hearing?: No Does the patient have difficulty seeing, even when wearing glasses/contacts?: No Does the patient have difficulty concentrating, remembering, or making decisions?: No Does the patient have difficulty walking or climbing stairs?: No Does the patient have difficulty dressing or bathing?: No Does the patient have difficulty doing errands alone such as visiting a doctor's office  or shopping?: No    Assessment & Plan  1. Peripheral vascular disease of lower extremity ([place])  Doing well, seen by vascular surgeon  2. Splenic infarction  Continue Eliquis  3. Splenic vein thrombosis  Continue Eliquis  4. Hyperglycemia  Last hgb A1C was 5.7%  5. Hyperlipemia  Continue follow medications and follow up with Dr. Rutherford Nail   6. Gastroesophageal reflux disease with esophagitis  - pantoprazole (PROTONIX) 40 MG tablet; Take 1 tablet (40 mg total) by mouth daily.  Dispense: 90 tablet; Refill: 1  7. Essential hypertension  - lisinopril (PRINIVIL,ZESTRIL) 20 MG tablet; Take 1 tablet (20 mg  total) by mouth 2 (two) times daily.  Dispense: 180 tablet; Refill: 1

## 2016-05-21 ENCOUNTER — Other Ambulatory Visit: Payer: Self-pay

## 2016-05-21 ENCOUNTER — Encounter: Payer: Self-pay | Admitting: Family Medicine

## 2016-05-21 ENCOUNTER — Ambulatory Visit (INDEPENDENT_AMBULATORY_CARE_PROVIDER_SITE_OTHER): Payer: Medicare Other | Admitting: Family Medicine

## 2016-05-21 VITALS — BP 120/72 | HR 83 | Temp 101.0°F | Resp 16 | Ht 70.0 in | Wt 206.5 lb

## 2016-05-21 DIAGNOSIS — N3941 Urge incontinence: Secondary | ICD-10-CM

## 2016-05-21 DIAGNOSIS — R350 Frequency of micturition: Secondary | ICD-10-CM

## 2016-05-21 DIAGNOSIS — R059 Cough, unspecified: Secondary | ICD-10-CM

## 2016-05-21 DIAGNOSIS — R05 Cough: Secondary | ICD-10-CM | POA: Diagnosis not present

## 2016-05-21 LAB — POCT URINALYSIS DIPSTICK
Bilirubin, UA: NEGATIVE
Glucose, UA: NEGATIVE
Ketones, UA: NEGATIVE
NITRITE UA: POSITIVE
PH UA: 5
PROTEIN UA: NEGATIVE
Spec Grav, UA: 1.015
Urobilinogen, UA: 0.2

## 2016-05-21 MED ORDER — LEVOFLOXACIN 500 MG PO TABS: 500.0000 mg | ORAL_TABLET | Freq: Every day | ORAL | Status: DC

## 2016-05-21 NOTE — Addendum Note (Signed)
Addended by: Johnnette Litter A on: 05/21/2016 03:38 PM   Modules accepted: Orders

## 2016-05-21 NOTE — Progress Notes (Signed)
Name: Jose Castro   MRN: [id number]    DOB: 1926-01-05   Date:05/21/2016       Progress Note  Subjective  Chief Complaint  Chief Complaint  Patient presents with  . Urinary Tract Infection    patient stated that he has had frequency for the past couple of days. patient stated that he start going before he can get to the bathroom.  . Fever    patient has been running a high fever. otc Tylenol. cold cloth on his head.  . Chills  . Cough    patient's daughter stated that he has been having some coughing spells.    HPI  Urinary frequency  and urinary incontinence/cough/fever: he noticed shaking and high fever two days ago, followed by a dry cough, no SOB and yesterday also noticed urinary urgency, incontinence but no dysuria. His abdominal wall is hurting from coughing so hard. He is able to eat, but mild decrease in appetite. Taking Tylenol for fever.    Patient Active Problem List   Diagnosis Date Noted  . Peripheral vascular disease of lower extremity ([place]) 03/03/2016  . Gallstone 12/31/2015  . 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 Arteriopathy ([place]) 11/22/2015  . Carotid artery narrowing 11/22/2015  . Diverticulosis of colon 11/22/2015  . Decreased creatinine clearance 11/22/2015  . Arthritis, degenerative 11/22/2015  . Lactose intolerance 11/22/2015  . Basal cell carcinoma 11/22/2015  . Left-sided low back pain with left-sided sciatica 06/21/2015  . Essential hypertension 06/21/2015  . Hyperlipemia 06/21/2015  . Gastroesophageal reflux disease with esophagitis 06/21/2015  . Calculus of kidney 11/18/2013    Past Surgical History  Procedure Laterality Date  . Kidney stone surgery    . Appendectomy    . Knee surgery    . Spine surgery      Family History  Problem Relation Age of Onset  . Cancer Brother     bladder cancer with mets  . Heart disease Mother   . Aortic aneurysm Mother   . Heart attack Maternal Aunt   . Heart attack  Maternal Uncle     Social History   Social History  . Marital Status: Widowed    Spouse Name: N/A  . Number of Children: N/A  . Years of Education: N/A   Occupational History  . Not on file.   Social History Main Topics  . Smoking status: Former Smoker -- 16 years    Types: Cigarettes    Start date: 10/21/1944    Quit date: 10/21/1960  . Smokeless tobacco: Never Used  . Alcohol Use: 0.0 oz/week    0 Standard drinks or equivalent per week     Comment: once a week when he goes out to dinner-socially  . Drug Use: No  . Sexual Activity:    Partners: Female   Other Topics Concern  . Not on file   Social History Narrative     Current outpatient prescriptions:  .  apixaban (ELIQUIS) 5 MG TABS tablet, Take 1 tablet (5 mg total) by mouth 2 (two) times daily., Disp: 60 tablet, Rfl: 0 .  brimonidine (ALPHAGAN) 0.2 % ophthalmic solution, Place 1 drop into both eyes 2 (two) times daily. , Disp: , Rfl:  .  Cholecalciferol (VITAMIN D3) 2000 units TABS, Take 1 tablet by mouth daily., Disp: , Rfl:  .  CIALIS 5 MG tablet, Take 5 mg by mouth daily. , Disp: , Rfl:  .  finasteride (PROSCAR) 5 MG tablet, Take  5 mg by mouth daily. , Disp: , Rfl:  .  lisinopril (PRINIVIL,ZESTRIL) 20 MG tablet, Take 1 tablet (20 mg total) by mouth 2 (two) times daily., Disp: 180 tablet, Rfl: 1 .  Omega-3 Fatty Acids (FISH OIL) 1000 MG CAPS, Take 1 capsule by mouth daily., Disp: , Rfl:  .  pantoprazole (PROTONIX) 40 MG tablet, Take 1 tablet (40 mg total) by mouth daily., Disp: 90 tablet, Rfl: 1  Allergies  Allergen Reactions  . Penicillins Itching and Swelling  . Gabapentin Hives    GI upset.     ROS  Ten systems reviewed and is negative except as mentioned in HPI   Objective  Filed Vitals:   05/21/16 1453  BP: 120/72  Pulse: 83  Temp: 101 F (38.3 C)  TempSrc: Oral  Resp: 16  Height: '5\' 10"'$  (1.778 m)  Weight: 206 lb 8 oz (93.668 kg)  SpO2: 93%    Body mass index is 29.63  kg/(m^2).  Physical Exam  Constitutional: Patient appears well-developed and well-nourished. Obese  No distress.  HEENT: head atraumatic, normocephalic, pupils equal and reactive to light,  neck supple, throat within normal limits Cardiovascular: Normal rate, regular rhythm and normal heart sounds.  No murmur heard. 1 plus LE edema on left leg and trace of right leg. Pulmonary/Chest: Effort normal and breath sounds normal. No respiratory distress. Abdominal: Soft.  There is no tenderness. Negative CVA tenderness Psychiatric: Patient has a normal mood and affect. behavior is normal. Judgment and thought content normal.  PHQ2/9: Depression screen [facility] 2/9 05/21/2016 03/03/2016 12/31/2015 10/22/2015 06/21/2015  Decreased Interest 0 0 0 0 0  Down, Depressed, Hopeless 0 0 0 0 0  PHQ - 2 Score 0 0 0 0 0    Fall Risk: Fall Risk  05/21/2016 03/03/2016 12/31/2015 10/22/2015 06/21/2015  Falls in the past year? No No No No No     Functional Status Survey: Is the patient deaf or have difficulty hearing?: No Does the patient have difficulty seeing, even when wearing glasses/contacts?: No Does the patient have difficulty concentrating, remembering, or making decisions?: No Does the patient have difficulty walking or climbing stairs?: No Does the patient have difficulty dressing or bathing?: No Does the patient have difficulty doing errands alone such as visiting a doctor's office or shopping?: No   Assessment & Plan  1. Frequency of urination  Advised to go to [facility] if symptoms gets worse or does not improve - POCT urinalysis dipstick - levofloxacin (LEVAQUIN) 500 MG tablet; Take 1 tablet (500 mg total) by mouth daily.  Dispense: 7 tablet; Refill: 0 - CULTURE, URINE COMPREHENSIVE  2. Cough  Daughter and patient would like to try antibiotics first and if no improvement check CXR.  - levofloxacin (LEVAQUIN) 500 MG tablet; Take 1 tablet (500 mg total) by mouth daily.  Dispense: 7 tablet; Refill:  0  3. Urge incontinence of urine  - levofloxacin (LEVAQUIN) 500 MG tablet; Take 1 tablet (500 mg total) by mouth daily.  Dispense: 7 tablet; Refill: 0 - CULTURE, URINE COMPREHENSIVE

## 2016-05-21 NOTE — Addendum Note (Signed)
Addended by: Johnnette Litter A on: 05/21/2016 03:50 PM   Modules accepted: Orders

## 2016-05-23 ENCOUNTER — Encounter: Payer: Self-pay | Admitting: Family Medicine

## 2016-05-23 ENCOUNTER — Ambulatory Visit (INDEPENDENT_AMBULATORY_CARE_PROVIDER_SITE_OTHER): Payer: Medicare Other | Admitting: Family Medicine

## 2016-05-23 VITALS — BP 160/72 | HR 72 | Temp 99.0°F | Resp 18 | Ht 70.0 in | Wt 205.4 lb

## 2016-05-23 DIAGNOSIS — I10 Essential (primary) hypertension: Secondary | ICD-10-CM | POA: Diagnosis not present

## 2016-05-23 DIAGNOSIS — N390 Urinary tract infection, site not specified: Secondary | ICD-10-CM | POA: Diagnosis not present

## 2016-05-23 DIAGNOSIS — R05 Cough: Secondary | ICD-10-CM

## 2016-05-23 DIAGNOSIS — R059 Cough, unspecified: Secondary | ICD-10-CM

## 2016-05-23 LAB — URINE CULTURE

## 2016-05-23 NOTE — Progress Notes (Signed)
Name: Jose Castro   MRN: [id number]    DOB: 02-11-1926   Date:05/23/2016       Progress Note  Subjective  Chief Complaint  Chief Complaint  Patient presents with  . Urinary Tract Infection    Patient fever is coming down and patient is starting to feel better-having burning still but no pain. Patient states cranberry juice is helping.     HPI  Urinary frequency and urinary incontinence/cough/fever: he was started on Levaquin two days ago and fever subsided yesterday, last dose of Tylenol was last night. Appetite is good. He still has dysuria, but no incontinence, urgency is still present, denies abdominal pain.   Cough: started on Levaquin 2 days ago, cough has improved, no SOB, pleuritic chest pain has resolved, feeling much better today   Patient Active Problem List   Diagnosis Date Noted  . Peripheral vascular disease of lower extremity ([place]) 03/03/2016  . Gallstone 12/31/2015  . 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 Arteriopathy ([place]) 11/22/2015  . Carotid artery narrowing 11/22/2015  . Diverticulosis of colon 11/22/2015  . Decreased creatinine clearance 11/22/2015  . Arthritis, degenerative 11/22/2015  . Lactose intolerance 11/22/2015  . Basal cell carcinoma 11/22/2015  . Left-sided low back pain with left-sided sciatica 06/21/2015  . Essential hypertension 06/21/2015  . Hyperlipemia 06/21/2015  . Gastroesophageal reflux disease with esophagitis 06/21/2015  . Calculus of kidney 11/18/2013    Past Surgical History  Procedure Laterality Date  . Kidney stone surgery    . Appendectomy    . Knee surgery    . Spine surgery      Family History  Problem Relation Age of Onset  . Cancer Brother     bladder cancer with mets  . Heart disease Mother   . Aortic aneurysm Mother   . Heart attack Maternal Aunt   . Heart attack Maternal Uncle     Social History   Social History  . Marital Status: Widowed    Spouse Name: N/A  . Number of  Children: N/A  . Years of Education: N/A   Occupational History  . Not on file.   Social History Main Topics  . Smoking status: Former Smoker -- 16 years    Types: Cigarettes    Start date: 10/21/1944    Quit date: 10/21/1960  . Smokeless tobacco: Never Used  . Alcohol Use: 0.0 oz/week    0 Standard drinks or equivalent per week     Comment: once a week when he goes out to dinner-socially  . Drug Use: No  . Sexual Activity:    Partners: Female   Other Topics Concern  . Not on file   Social History Narrative     Current outpatient prescriptions:  .  apixaban (ELIQUIS) 5 MG TABS tablet, Take 1 tablet (5 mg total) by mouth 2 (two) times daily., Disp: 60 tablet, Rfl: 0 .  brimonidine (ALPHAGAN) 0.2 % ophthalmic solution, Place 1 drop into both eyes 2 (two) times daily. , Disp: , Rfl:  .  Cholecalciferol (VITAMIN D3) 2000 units TABS, Take 1 tablet by mouth daily., Disp: , Rfl:  .  CIALIS 5 MG tablet, Take 5 mg by mouth daily. , Disp: , Rfl:  .  finasteride (PROSCAR) 5 MG tablet, Take 5 mg by mouth daily. , Disp: , Rfl:  .  levofloxacin (LEVAQUIN) 500 MG tablet, Take 1 tablet (500 mg total) by mouth daily., Disp: 7 tablet, Rfl: 0 .  lisinopril (  PRINIVIL,ZESTRIL) 20 MG tablet, Take 1 tablet (20 mg total) by mouth 2 (two) times daily., Disp: 180 tablet, Rfl: 1 .  Omega-3 Fatty Acids (FISH OIL) 1000 MG CAPS, Take 1 capsule by mouth daily., Disp: , Rfl:  .  pantoprazole (PROTONIX) 40 MG tablet, Take 1 tablet (40 mg total) by mouth daily., Disp: 90 tablet, Rfl: 1  Allergies  Allergen Reactions  . Penicillins Itching and Swelling  . Gabapentin Hives    GI upset.     ROS  Ten systems reviewed and is negative except as mentioned in HPI   Objective  Filed Vitals:   05/23/16 1329  BP: 156/78  Pulse: 72  Temp: 99 F (37.2 C)  TempSrc: Oral  Resp: 18  Height: '5\' 10"'$  (1.778 m)  Weight: 205 lb 6.4 oz (93.169 kg)  SpO2: 94%    Body mass index is 29.47 kg/(m^2).  Physical  Exam  Constitutional: Patient appears well-developed and well-nourished.  No distress.  HEENT: head atraumatic, normocephalic, pupils equal and reactive to light, neck supple, throat within normal limits Cardiovascular: Normal rate, regular rhythm and normal heart sounds.  No murmur heard. No BLE edema. Pulmonary/Chest: Effort normal and breath sounds normal. No respiratory distress. Abdominal: Soft.  There is no tenderness. Psychiatric: Patient has a normal mood and affect. behavior is normal. Judgment and thought content normal.  Recent Results (from the past 2160 hour(s))  POCT urinalysis dipstick     Status: Abnormal   Collection Time: 05/21/16  3:00 PM  Result Value Ref Range   Color, UA dark    Clarity, UA cloudy    Glucose, UA negative    Bilirubin, UA negative    Ketones, UA negative    Spec Grav, UA 1.015    Blood, UA large    pH, UA 5.0    Protein, UA negative    Urobilinogen, UA 0.2    Nitrite, UA positive    Leukocytes, UA moderate (2+) (A) Negative  Urine Culture     Status: None (Preliminary result)   Collection Time: 05/21/16  3:49 PM  Result Value Ref Range   Colony Count >=100,000 COLONIES/ML    Preliminary Report ESCHERICHIA COLI       PHQ2/9: Depression screen [facility] 2/9 05/21/2016 03/03/2016 12/31/2015 10/22/2015 06/21/2015  Decreased Interest 0 0 0 0 0  Down, Depressed, Hopeless 0 0 0 0 0  PHQ - 2 Score 0 0 0 0 0    Fall Risk: Fall Risk  05/21/2016 03/03/2016 12/31/2015 10/22/2015 06/21/2015  Falls in the past year? No No No No No      Assessment & Plan  1. Urinary tract infection without hematuria, site unspecified  Continue medication, urine sensitivity is pending  2. Uncontrolled hypertension  Recheck before he leaves  3. Cough  Continue Levaquin , doing better

## 2016-05-29 ENCOUNTER — Telehealth: Payer: Self-pay | Admitting: *Deleted

## 2016-05-29 ENCOUNTER — Ambulatory Visit (INDEPENDENT_AMBULATORY_CARE_PROVIDER_SITE_OTHER): Payer: Medicare Other | Admitting: Family Medicine

## 2016-05-29 ENCOUNTER — Telehealth: Payer: Self-pay

## 2016-05-29 ENCOUNTER — Encounter: Payer: Self-pay | Admitting: Family Medicine

## 2016-05-29 VITALS — BP 126/64 | HR 72 | Temp 98.2°F | Resp 18 | Wt 201.2 lb

## 2016-05-29 DIAGNOSIS — N390 Urinary tract infection, site not specified: Secondary | ICD-10-CM

## 2016-05-29 DIAGNOSIS — N400 Enlarged prostate without lower urinary tract symptoms: Secondary | ICD-10-CM | POA: Diagnosis not present

## 2016-05-29 MED ORDER — CIPROFLOXACIN HCL 250 MG PO TABS: 250.0000 mg | ORAL_TABLET | Freq: Two times a day (BID) | ORAL | Status: DC

## 2016-05-29 NOTE — Telephone Encounter (Signed)
Daughter called asking when patient should hold his eliquis prior to blood work on July 3rd. I discussed this with an oncologist and was told to hold for 48 hours. Daughter wanted to know why patients appt was changed to seeing a NP. I explained Dr Rudean Hitt is no longer with [place], so a lot of our hematology patients are being seen by NP's. Hematologist is available to consult with the NP seeing her dad.

## 2016-05-29 NOTE — Telephone Encounter (Signed)
Daughter is concern about father having blood work done for [employer] and seeing a NP afterwards. Patient daughter wants Dr. Ancil Boozer opinion on why the labs are being drawn and does her father really need them and why they are switching providers on her father. Daughter feels as the NP will not be able to tell them as much as the MD would and is upset they are scaling down her father's care and ask Dr. Ancil Boozer to please call. Patient daughter [name] stated they talked about his Oncology visits in the past during their office visits and has questions about them continuing the Eliquis.

## 2016-05-29 NOTE — Progress Notes (Signed)
Name: Jose Castro   MRN: [id number]    DOB: 1926-04-29   Date:05/29/2016       Progress Note  Subjective  Chief Complaint  Chief Complaint  Patient presents with  . Follow-up    patient stated that his burning upon urination persists even with completing his antibiotic on Tuesday.    HPI  UTI: : he was started on Levaquin 05/21/2016 for urinary frequency and incontinence, urine culture was sensitive to Levaquin and patient responded well to  Medication and has noticed intermittent burning since he still has frequency, no blood in urine or incontinence. He is a patient of Dr. Eliberto Ivory and we will change antibiotics. No longer has fever, he has normal appetite. Feeling well otherwise but worried that the infection is back.   Patient Active Problem List   Diagnosis Date Noted  . Peripheral vascular disease of lower extremity ([place]) 03/03/2016  . Gallstone 12/31/2015  . 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 Arteriopathy ([place]) 11/22/2015  . Carotid artery narrowing 11/22/2015  . Diverticulosis of colon 11/22/2015  . Decreased creatinine clearance 11/22/2015  . Arthritis, degenerative 11/22/2015  . Lactose intolerance 11/22/2015  . Basal cell carcinoma 11/22/2015  . Left-sided low back pain with left-sided sciatica 06/21/2015  . Essential hypertension 06/21/2015  . Hyperlipemia 06/21/2015  . Gastroesophageal reflux disease with esophagitis 06/21/2015  . Calculus of kidney 11/18/2013    Past Surgical History  Procedure Laterality Date  . Kidney stone surgery    . Appendectomy    . Knee surgery    . Spine surgery      Family History  Problem Relation Age of Onset  . Cancer Brother     bladder cancer with mets  . Heart disease Mother   . Aortic aneurysm Mother   . Heart attack Maternal Aunt   . Heart attack Maternal Uncle     Social History   Social History  . Marital Status: Widowed    Spouse Name: N/A  . Number of Children: N/A  . Years  of Education: N/A   Occupational History  . Not on file.   Social History Main Topics  . Smoking status: Former Smoker -- 16 years    Types: Cigarettes    Start date: 10/21/1944    Quit date: 10/21/1960  . Smokeless tobacco: Never Used  . Alcohol Use: 0.0 oz/week    0 Standard drinks or equivalent per week     Comment: once a week when he goes out to dinner-socially  . Drug Use: No  . Sexual Activity:    Partners: Female   Other Topics Concern  . Not on file   Social History Narrative     Current outpatient prescriptions:  .  apixaban (ELIQUIS) 5 MG TABS tablet, Take 1 tablet (5 mg total) by mouth 2 (two) times daily., Disp: 60 tablet, Rfl: 0 .  brimonidine (ALPHAGAN) 0.2 % ophthalmic solution, Place 1 drop into both eyes 2 (two) times daily. , Disp: , Rfl:  .  Cholecalciferol (VITAMIN D3) 2000 units TABS, Take 1 tablet by mouth daily., Disp: , Rfl:  .  CIALIS 5 MG tablet, Take 5 mg by mouth daily. , Disp: , Rfl:  .  ciprofloxacin (CIPRO) 250 MG tablet, Take 1 tablet (250 mg total) by mouth 2 (two) times daily., Disp: 10 tablet, Rfl: 0 .  finasteride (PROSCAR) 5 MG tablet, Take 5 mg by mouth daily. , Disp: , Rfl:  .  lisinopril (  PRINIVIL,ZESTRIL) 20 MG tablet, Take 1 tablet (20 mg total) by mouth 2 (two) times daily., Disp: 180 tablet, Rfl: 1 .  Omega-3 Fatty Acids (FISH OIL) 1000 MG CAPS, Take 1 capsule by mouth daily., Disp: , Rfl:  .  pantoprazole (PROTONIX) 40 MG tablet, Take 1 tablet (40 mg total) by mouth daily., Disp: 90 tablet, Rfl: 1  Allergies  Allergen Reactions  . Penicillins Itching and Swelling  . Gabapentin Hives    GI upset.     ROS  Ten systems reviewed and is negative except as mentioned in HPI    Objective  Filed Vitals:   05/29/16 1007  BP: 126/64  Pulse: 72  Temp: 98.2 F (36.8 C)  TempSrc: Oral  Resp: 18  Weight: 201 lb 3.2 oz (91.264 kg)  SpO2: 93%    Body mass index is 28.87 kg/(m^2).  Physical Exam  Constitutional: Patient  appears well-developed and well-nourished. Obese  No distress.  HEENT: head atraumatic, normocephalic, pupils equal and reactive to light,  neck supple, throat within normal limits Cardiovascular: Normal rate, regular rhythm and normal heart sounds.  No murmur heard. No BLE edema. Pulmonary/Chest: Effort normal and breath sounds normal. No respiratory distress. Abdominal: Soft.  There is no tenderness. Psychiatric: Patient has a normal mood and affect. behavior is normal. Judgment and thought content normal.  Recent Results (from the past 2160 hour(s))  POCT urinalysis dipstick     Status: Abnormal   Collection Time: 05/21/16  3:00 PM  Result Value Ref Range   Color, UA dark    Clarity, UA cloudy    Glucose, UA negative    Bilirubin, UA negative    Ketones, UA negative    Spec Grav, UA 1.015    Blood, UA large    pH, UA 5.0    Protein, UA negative    Urobilinogen, UA 0.2    Nitrite, UA positive    Leukocytes, UA moderate (2+) (A) Negative  Urine Culture     Status: None   Collection Time: 05/21/16  3:49 PM  Result Value Ref Range   Culture ESCHERICHIA COLI    Colony Count >=100,000 COLONIES/ML    Organism ID, Bacteria ESCHERICHIA COLI       Susceptibility   Escherichia coli -  (no method available)    AMPICILLIN 4 Sensitive     AMOX/CLAVULANIC <=2 Sensitive     AMPICILLIN/SULBACTAM <=2 Sensitive     PIP/TAZO <=4 Sensitive     IMIPENEM <=0.25 Sensitive     CEFAZOLIN <=4 Not Reportable     CEFTRIAXONE <=1 Sensitive     CEFTAZIDIME <=1 Sensitive     CEFEPIME <=1 Sensitive     GENTAMICIN <=1 Sensitive     TOBRAMYCIN <=1 Sensitive     CIPROFLOXACIN <=0.25 Sensitive     LEVOFLOXACIN <=0.12 Sensitive     NITROFURANTOIN <=16 Sensitive     TRIMETH/SULFA* >=320 Resistant      * NR=NOT REPORTABLE,SEE COMMENTORAL therapy:A cefazolin MIC of <32 predicts susceptibility to the oral agents cefaclor,cefdinir,cefpodoxime,cefprozil,cefuroxime,cephalexin,and loracarbef when used for therapy  of uncomplicated UTIs due to E.coli,K.pneumomiae,and P.mirabilis. PARENTERAL therapy: A cefazolinMIC of >8 indicates resistance to parenteralcefazolin. An alternate test method must beperformed to confirm susceptibility to parenteralcefazolin.   IPSS Questionnaire (AUA-7): Over the past month.   1)  How often have you had a sensation of not emptying your bladder completely after you finish urinating?  2 - Less than half the time  2)  How often have you had  to urinate again less than two hours after you finished urinating? 3 - About half the time  3)  How often have you found you stopped and started again several times when you urinated?  3 - About half the time  4) How difficult have you found it to postpone urination?  3 - About half the time  5) How often have you had a weak urinary stream?  1 - Less than 1 time in 5  6) How often have you had to push or strain to begin urination?  0 - Not at all  7) How many times did you most typically get up to urinate from the time you went to bed until the time you got up in the morning?  2 - 2 times  Total score:  0-7 mildly symptomatic   8-19 moderately symptomatic   20-35 severely symptomatic    PHQ2/9: Depression screen [facility] 2/9 05/29/2016 05/21/2016 03/03/2016 12/31/2015 10/22/2015  Decreased Interest 0 0 0 0 0  Down, Depressed, Hopeless 0 0 0 0 0  PHQ - 2 Score 0 0 0 0 0    Fall Risk: Fall Risk  05/29/2016 05/21/2016 03/03/2016 12/31/2015 10/22/2015  Falls in the past year? No No No No No    Functional Status Survey: Is the patient deaf or have difficulty hearing?: No Does the patient have difficulty seeing, even when wearing glasses/contacts?: No Does the patient have difficulty concentrating, remembering, or making decisions?: No Does the patient have difficulty walking or climbing stairs?: No Does the patient have difficulty dressing or bathing?: No Does the patient have difficulty doing errands alone such as visiting a doctor's office or  shopping?: No    Assessment & Plan  1. Urinary tract infection without hematuria, site unspecified  - Urine culture - ciprofloxacin (CIPRO) 250 MG tablet; Take 1 tablet (250 mg total) by mouth 2 (two) times daily.  Dispense: 10 tablet; Refill: 0   2. BPH (benign prostatic hyperplasia)  On Proscar but symptoms are worse, needs to follow up with Dr. Eliberto Ivory AUA of 17, however most of the symptoms affected by recent/present UTI

## 2016-05-30 ENCOUNTER — Ambulatory Visit: Payer: Medicare Other | Admitting: Family Medicine

## 2016-05-30 NOTE — Telephone Encounter (Signed)
She already called oncologist and she requested for him to see MD from now on.  She will discuss getting other labs with oncologist next week

## 2016-05-31 LAB — URINE CULTURE

## 2016-06-02 ENCOUNTER — Ambulatory Visit: Payer: Medicare Other

## 2016-06-02 ENCOUNTER — Inpatient Hospital Stay: Payer: Medicare Other

## 2016-06-09 ENCOUNTER — Inpatient Hospital Stay: Payer: Medicare Other

## 2016-06-16 ENCOUNTER — Ambulatory Visit: Payer: Medicare Other | Admitting: Hematology and Oncology

## 2016-06-16 ENCOUNTER — Ambulatory Visit: Payer: Medicare Other | Admitting: Family Medicine

## 2016-06-16 ENCOUNTER — Ambulatory Visit: Payer: Medicare Other

## 2016-06-17 ENCOUNTER — Telehealth: Payer: Self-pay | Admitting: Family Medicine

## 2016-06-17 NOTE — Telephone Encounter (Signed)
Daughter [name] would like a call back about the blood thinner that patient is on. [phone]

## 2016-06-17 NOTE — Telephone Encounter (Signed)
Needs follow up with hematologist

## 2016-06-17 NOTE — Telephone Encounter (Signed)
Tried to contact this patient's daughter to see how we can of assistance to her but there was no answer. A Message was left for her to give [place] a call when she got the chance.

## 2016-06-17 NOTE — Telephone Encounter (Signed)
Contacted this patient's daughter back and explained that they cannot go forward with the appropriate treatment without truly knowing what his blood results are showing. She then asked if we could scheduled a visit for labs then f/u.  Patient was scheduled for labs on 07/14/16 @ 2:15pm and office visit at 2:30pm.  Daughter was informed.

## 2016-06-17 NOTE — Telephone Encounter (Signed)
Jose Castro called saying that the appointment with the cancer doctor and to do the blood work was cancelled so she was not sure of what to do now. She is needing advice of how to go forward with getting the blood work and if her dad needed to f/u with the cancer doctor or could he just f/u here with Dr. Ancil Boozer since Dr. Rutherford Nail is on extended medical leave.  Please advise.

## 2016-07-10 ENCOUNTER — Other Ambulatory Visit: Payer: Self-pay | Admitting: *Deleted

## 2016-07-10 DIAGNOSIS — I82890 Acute embolism and thrombosis of other specified veins: Secondary | ICD-10-CM

## 2016-07-14 ENCOUNTER — Inpatient Hospital Stay: Payer: Medicare Other

## 2016-07-14 ENCOUNTER — Inpatient Hospital Stay: Payer: Medicare Other | Attending: Internal Medicine | Admitting: Internal Medicine

## 2016-07-14 DIAGNOSIS — I82890 Acute embolism and thrombosis of other specified veins: Secondary | ICD-10-CM

## 2016-07-14 DIAGNOSIS — Z7901 Long term (current) use of anticoagulants: Secondary | ICD-10-CM | POA: Diagnosis not present

## 2016-07-14 DIAGNOSIS — Z87891 Personal history of nicotine dependence: Secondary | ICD-10-CM | POA: Diagnosis not present

## 2016-07-14 DIAGNOSIS — Z87442 Personal history of urinary calculi: Secondary | ICD-10-CM | POA: Diagnosis not present

## 2016-07-14 DIAGNOSIS — E785 Hyperlipidemia, unspecified: Secondary | ICD-10-CM | POA: Diagnosis not present

## 2016-07-14 DIAGNOSIS — I10 Essential (primary) hypertension: Secondary | ICD-10-CM

## 2016-07-14 DIAGNOSIS — Z79899 Other long term (current) drug therapy: Secondary | ICD-10-CM | POA: Diagnosis not present

## 2016-07-14 DIAGNOSIS — D6862 Lupus anticoagulant syndrome: Secondary | ICD-10-CM | POA: Diagnosis not present

## 2016-07-14 DIAGNOSIS — N400 Enlarged prostate without lower urinary tract symptoms: Secondary | ICD-10-CM | POA: Diagnosis not present

## 2016-07-14 DIAGNOSIS — D735 Infarction of spleen: Secondary | ICD-10-CM | POA: Diagnosis not present

## 2016-07-14 DIAGNOSIS — K219 Gastro-esophageal reflux disease without esophagitis: Secondary | ICD-10-CM

## 2016-07-14 DIAGNOSIS — Z8052 Family history of malignant neoplasm of bladder: Secondary | ICD-10-CM | POA: Diagnosis not present

## 2016-07-14 LAB — FIBRIN DERIVATIVES D-DIMER (ARMC ONLY): FIBRIN DERIVATIVES D-DIMER (ARMC): 411 (ref 0–499)

## 2016-07-14 NOTE — Progress Notes (Signed)
[facility] OFFICE PROGRESS NOTE  Patient Care Team: Ashok Norris, MD as PCP - General (Family Medicine)  No matching staging information was found for the patient.  # Jan 2017- multiple splenic infarcts along with splenic vein thrombosis-on eliquis [follow up with Dr.Schneir]; Hypercoagulable workup- ?? positivity for lupus anticoagulant [done while on Eliquis]   No history exists.      INTERVAL HISTORY:  Jose Castro 82 y.o.  male pleasant patient above history of Splenic vein thrombosis of unclear etiology diagnosed in January 2017 currently on Eliquis is here for follow-up accompanied by his daughter.  Patient denies any falls. Denies any bleeding issues. Denies any abdominal pain. Denies any nausea vomiting. No blood in stools or black stools.  REVIEW OF SYSTEMS:  A complete 10 point review of system is done which is negative except mentioned above/history of present illness.   PAST MEDICAL HISTORY :  Past Medical History:  Diagnosis Date  . BPH (benign prostatic hyperplasia)   . Chronic kidney disease    had a kidney stone which per family was a cyst that was removed  . GERD (gastroesophageal reflux disease)   . Hyperlipidemia   . Hypertension   . Splenic vein thrombosis   . Thrombosis 12/2015   mural  area and no notation of heart attack    PAST SURGICAL HISTORY :   Past Surgical History:  Procedure Laterality Date  . APPENDECTOMY    . KIDNEY STONE SURGERY    . KNEE SURGERY    . SPINE SURGERY      FAMILY HISTORY :   Family History  Problem Relation Age of Onset  . Cancer Brother     bladder cancer with mets  . Heart disease Mother   . Aortic aneurysm Mother   . Heart attack Maternal Aunt   . Heart attack Maternal Uncle     SOCIAL HISTORY:   Social History  Substance Use Topics  . Smoking status: Former Smoker    Years: 16.00    Types: Cigarettes    Start date: 10/21/1944    Quit date: 10/21/1960  . Smokeless tobacco: Never Used   . Alcohol use 0.0 oz/week     Comment: once a week when he goes out to dinner-socially    ALLERGIES:  is allergic to penicillins.  MEDICATIONS:  Current Outpatient Prescriptions  Medication Sig Dispense Refill  . apixaban (ELIQUIS) 5 MG TABS tablet Take 1 tablet (5 mg total) by mouth 2 (two) times daily. 60 tablet 0  . brimonidine (ALPHAGAN) 0.2 % ophthalmic solution Place 1 drop into both eyes 2 (two) times daily.     . Cholecalciferol (VITAMIN D3) 2000 units TABS Take 1 tablet by mouth daily.    Marland Kitchen CIALIS 5 MG tablet Take 5 mg by mouth daily.     . finasteride (PROSCAR) 5 MG tablet Take 5 mg by mouth daily.     Marland Kitchen lisinopril (PRINIVIL,ZESTRIL) 20 MG tablet Take 1 tablet (20 mg total) by mouth 2 (two) times daily. 180 tablet 1  . Omega-3 Fatty Acids (FISH OIL) 1000 MG CAPS Take 1 capsule by mouth daily.    . pantoprazole (PROTONIX) 40 MG tablet Take 1 tablet (40 mg total) by mouth daily. 90 tablet 1   No current facility-administered medications for this visit.     PHYSICAL EXAMINATION:   BP (!) 169/81 (BP Location: Left Arm, Patient Position: Sitting)   Pulse (!) 52   Temp 98.4 F (36.9 C) (Tympanic)  Resp 18   Wt 202 lb 5 oz (91.8 kg)   BMI 29.03 kg/m   Filed Weights   07/14/16 1439  Weight: 202 lb 5 oz (91.8 kg)    GENERAL: Well-nourished well-developed; Alert, no distress and comfortable.   Walker himself. Much younger than his stated age. Accompanied by his daughter. EYES: no pallor or icterus OROPHARYNX: no thrush or ulceration; good dentition  NECK: supple, no masses felt LYMPH:  no palpable lymphadenopathy in the cervical, axillary or inguinal regions LUNGS: clear to auscultation and  No wheeze or crackles HEART/CVS: regular rate & rhythm and no murmurs; No lower extremity edema ABDOMEN:abdomen soft, non-tender and normal bowel sounds Musculoskeletal:no cyanosis of digits and no clubbing  PSYCH: alert & oriented x 3 with fluent speech NEURO: no focal  motor/sensory deficits SKIN:  no rashes or significant lesions  LABORATORY DATA:  I have reviewed the data as listed    Component Value Date/Time   NA 137 11/28/2015 0438   NA 143 10/22/2015 0954   NA 138 11/27/2013 0819   K 3.6 11/28/2015 0438   K 4.1 11/27/2013 0819   CL 106 11/28/2015 0438   CL 107 11/27/2013 0819   CO2 23 11/28/2015 0438   CO2 23 11/27/2013 0819   GLUCOSE 101 (H) 11/28/2015 0438   GLUCOSE 101 (H) 11/27/2013 0819   BUN 25 (H) 11/28/2015 0438   BUN 20 10/22/2015 0954   BUN 22 (H) 11/27/2013 0819   CREATININE 1.01 11/28/2015 0438   CREATININE 1.26 11/27/2013 0819   CALCIUM 8.0 (L) 11/28/2015 0438   CALCIUM 8.2 (L) 11/27/2013 0819   PROT 6.5 11/27/2015 1958   PROT 6.6 10/22/2015 0954   PROT 7.2 11/26/2013 0239   ALBUMIN 3.3 (L) 11/27/2015 1958   ALBUMIN 4.2 10/22/2015 0954   ALBUMIN 3.3 (L) 11/26/2013 0239   AST 12 (L) 11/27/2015 1958   AST 21 11/26/2013 0239   ALT 10 (L) 11/27/2015 1958   ALT 18 11/26/2013 0239   ALKPHOS 54 11/27/2015 1958   ALKPHOS 68 11/26/2013 0239   BILITOT 1.2 11/27/2015 1958   BILITOT 0.7 10/22/2015 0954   BILITOT 0.5 11/26/2013 0239   GFRNONAA >60 11/28/2015 0438   GFRNONAA 52 (L) 11/27/2013 0819   GFRAA >60 11/28/2015 0438   GFRAA 60 (L) 11/27/2013 0819    No results found for: SPEP, UPEP  Lab Results  Component Value Date   WBC 9.2 12/17/2015   NEUTROABS 5.7 12/17/2015   HGB 13.7 11/30/2015   HCT 44.9 12/17/2015   MCV 89 12/17/2015   PLT 395 (H) 12/17/2015      Chemistry      Component Value Date/Time   NA 137 11/28/2015 0438   NA 143 10/22/2015 0954   NA 138 11/27/2013 0819   K 3.6 11/28/2015 0438   K 4.1 11/27/2013 0819   CL 106 11/28/2015 0438   CL 107 11/27/2013 0819   CO2 23 11/28/2015 0438   CO2 23 11/27/2013 0819   BUN 25 (H) 11/28/2015 0438   BUN 20 10/22/2015 0954   BUN 22 (H) 11/27/2013 0819   CREATININE 1.01 11/28/2015 0438   CREATININE 1.26 11/27/2013 0819      Component Value Date/Time    CALCIUM 8.0 (L) 11/28/2015 0438   CALCIUM 8.2 (L) 11/27/2013 0819   ALKPHOS 54 11/27/2015 1958   ALKPHOS 68 11/26/2013 0239   AST 12 (L) 11/27/2015 1958   AST 21 11/26/2013 0239   ALT 10 (L) 11/27/2015 1958  ALT 18 11/26/2013 0239   BILITOT 1.2 11/27/2015 1958   BILITOT 0.7 10/22/2015 0954   BILITOT 0.5 11/26/2013 0239       RADIOGRAPHIC STUDIES: I have personally reviewed the radiological images as listed and agreed with the findings in the report. No results found.   ASSESSMENT & PLAN:  Splenic vein thrombosis # Splenic vein thrombosis-? Etiology. 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   # Etiology - unclear; protein C & S- negative. 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 Await prothrombin gene mutation factor V Leiden; jack 2 mutation.   # The length of anticoagulation- is controversial in case of unprovoked DVT.  I would recommend a 6-8 months of anticoagulation given his age. Will call pt/daughter a call if any of the tests are abnormal. Pt has follow up with Dr.Schneir in next few weeks re: stopping of Eliquis.   # no follow up planned unless any of above tests are abnormal.    No orders of the defined types were placed in this encounter.     Cammie Sickle, MD 07/15/2016 8:32 AM

## 2016-07-14 NOTE — Assessment & Plan Note (Addendum)
#   Splenic vein thrombosis/splenic infarct-? Etiology. Positive DRVVT lupus anticoagulant [? False-positive while on Eliquis]-on Eliquis.   # Etiology - unclear; protein C & S- negative. 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 Await prothrombin gene mutation factor V Leiden; jack 2 mutation.   # The length of anticoagulation- is controversial in case of unprovoked DVT.  I would recommend a 6-8 months of anticoagulation given his age. Will call pt/daughter a call if any of the tests are abnormal. Pt has follow up with Dr.Schneir in next few weeks re: stopping of Eliquis.   # no follow up planned unless any of above tests are abnormal.

## 2016-07-16 LAB — LUPUS ANTICOAGULANT PANEL
DRVVT: 50.8 s — ABNORMAL HIGH (ref 0.0–47.0)
PTT LA: 40.7 s (ref 0.0–51.9)

## 2016-07-16 LAB — DRVVT MIX: DRVVT MIX: 43.5 s (ref 0.0–47.0)

## 2016-07-17 LAB — FACTOR 5 LEIDEN

## 2016-07-18 LAB — JAK2 GENOTYPR

## 2016-07-18 LAB — PROTHROMBIN GENE MUTATION

## 2016-07-18 NOTE — Progress Notes (Signed)
Called patient to inform him that the workup for his blood clot was negative. Whem he sees Dr Ronalee Belts he will be taken off blood thinners.  Verbalized understanding.

## 2016-09-16 ENCOUNTER — Ambulatory Visit (INDEPENDENT_AMBULATORY_CARE_PROVIDER_SITE_OTHER): Payer: Medicare Other | Admitting: Family Medicine

## 2016-09-16 ENCOUNTER — Encounter: Payer: Self-pay | Admitting: Family Medicine

## 2016-09-16 VITALS — BP 138/68 | HR 60 | Temp 100.1°F | Wt 202.5 lb

## 2016-09-16 DIAGNOSIS — R6883 Chills (without fever): Secondary | ICD-10-CM

## 2016-09-16 DIAGNOSIS — R350 Frequency of micturition: Secondary | ICD-10-CM | POA: Diagnosis not present

## 2016-09-16 DIAGNOSIS — R05 Cough: Secondary | ICD-10-CM

## 2016-09-16 DIAGNOSIS — R058 Other specified cough: Secondary | ICD-10-CM

## 2016-09-16 MED ORDER — LEVOFLOXACIN 500 MG PO TABS: 500.0000 mg | ORAL_TABLET | Freq: Every day | ORAL | 0 refills | Status: DC

## 2016-09-16 NOTE — Progress Notes (Signed)
Name: Jose Castro   MRN: [id number]    DOB: 1926-09-19   Date:09/16/2016       Progress Note  Subjective  Chief Complaint  Chief Complaint  Patient presents with  . Nasal Congestion  . Chest Pain  . Sinusitis  . Cough    HPI  Cough/Chills/urinary frequency: he states he developed cold symptoms last week, six days ago he developed a wet cough, that is better during the day and worse at night, and last night he had chills, nausea, urinary frequency a rattle sensation on his chest. He denies orthopnea, lower extremity edema, no chest pain or palpitation. He still has some nasal congestion and facial pressure.    Patient Active Problem List   Diagnosis Date Noted  . BPH (benign prostatic hyperplasia) 05/29/2016  . Peripheral vascular disease of lower extremity ([place]) 03/03/2016  . Gallstone 12/31/2015  . 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 Arteriopathy ([place]) 11/22/2015  . Carotid artery narrowing 11/22/2015  . Diverticulosis of colon 11/22/2015  . Decreased creatinine clearance 11/22/2015  . Arthritis, degenerative 11/22/2015  . Lactose intolerance 11/22/2015  . Basal cell carcinoma 11/22/2015  . Left-sided low back pain with left-sided sciatica 06/21/2015  . Essential hypertension 06/21/2015  . Hyperlipemia 06/21/2015  . Gastroesophageal reflux disease with esophagitis 06/21/2015  . Calculus of kidney 11/18/2013    Past Surgical History:  Procedure Laterality Date  . APPENDECTOMY    . KIDNEY STONE SURGERY    . KNEE SURGERY    . SPINE SURGERY      Family History  Problem Relation Age of Onset  . Cancer Brother     bladder cancer with mets  . Heart disease Mother   . Aortic aneurysm Mother   . Heart attack Maternal Aunt   . Heart attack Maternal Uncle     Social History   Social History  . Marital status: Widowed    Spouse name: N/A  . Number of children: N/A  . Years of education: N/A   Occupational History  . Not on  file.   Social History Main Topics  . Smoking status: Former Smoker    Years: 16.00    Types: Cigarettes    Start date: 10/21/1944    Quit date: 10/21/1960  . Smokeless tobacco: Never Used  . Alcohol use 0.0 oz/week     Comment: once a week when he goes out to dinner-socially  . Drug use: No  . Sexual activity: Yes    Partners: Female   Other Topics Concern  . Not on file   Social History Narrative  . No narrative on file     Current Outpatient Prescriptions:  .  apixaban (ELIQUIS) 5 MG TABS tablet, Take 1 tablet (5 mg total) by mouth 2 (two) times daily., Disp: 60 tablet, Rfl: 0 .  brimonidine (ALPHAGAN) 0.2 % ophthalmic solution, Place 1 drop into both eyes 2 (two) times daily. , Disp: , Rfl:  .  Cholecalciferol (VITAMIN D3) 2000 units TABS, Take 1 tablet by mouth daily., Disp: , Rfl:  .  CIALIS 5 MG tablet, Take 5 mg by mouth daily. , Disp: , Rfl:  .  finasteride (PROSCAR) 5 MG tablet, Take 5 mg by mouth daily. , Disp: , Rfl:  .  levofloxacin (LEVAQUIN) 500 MG tablet, Take 1 tablet (500 mg total) by mouth daily., Disp: 7 tablet, Rfl: 0 .  lisinopril (PRINIVIL,ZESTRIL) 20 MG tablet, Take 1 tablet (20 mg total) by mouth  2 (two) times daily., Disp: 180 tablet, Rfl: 1 .  Omega-3 Fatty Acids (FISH OIL) 1000 MG CAPS, Take 1 capsule by mouth daily., Disp: , Rfl:  .  pantoprazole (PROTONIX) 40 MG tablet, Take 1 tablet (40 mg total) by mouth daily., Disp: 90 tablet, Rfl: 1  Allergies  Allergen Reactions  . Penicillins Itching and Swelling     ROS  Constitutional: Negative for fever or weight change.  Respiratory: Positive for cough but no shortness of breath.   Cardiovascular: Negative for chest pain or palpitations.  Gastrointestinal: Negative for abdominal pain, no bowel changes.  Musculoskeletal: Negative for gait problem or joint swelling.  Skin: Negative for rash.  Neurological: Negative for dizziness or headache.  No other specific complaints in a complete review of  systems (except as listed in HPI above).  Objective  Vitals:   09/16/16 1542  BP: 138/68  Pulse: 60  Temp: 100.1 F (37.8 C)  SpO2: 93%  Weight: 202 lb 8 oz (91.9 kg)    Body mass index is 29.06 kg/m.  Physical Exam  Constitutional: Patient appears well-developed and well-nourished. Obese No distress.  HEENT: head atraumatic, normocephalic, pupils equal and reactive to light, ears normal TM, boggy turbinates, clear rhinorrhea,  neck supple, throat within normal limits Cardiovascular: Normal rate, regular rhythm and normal heart sounds.  No murmur heard. Trace BLE edema. Pulmonary/Chest: Effort normal , diffuse rhonchi, No respiratory distress. Abdominal: Soft.  There is no tenderness. Psychiatric: Patient has a normal mood and affect. behavior is normal. Judgment and thought content normal.  Recent Results (from the past 2160 hour(s))  Factor V leiden     Status: None   Collection Time: 07/14/16  2:03 PM  Result Value Ref Range   Recommendations-F5LEID: Comment     Comment: (NOTE) Result:  Negative (no mutation found) Factor V Leiden is a specific mutation (R506Q) in the factor V gene that is associated with an increased risk of venous thrombosis. Factor V Leiden is more resistant to inactivation by activated protein C.  As a result, factor V persists in the circulation leading to a mild hyper- coagulable state.  The Leiden mutation accounts for 90% - 95% of APC resistance.  Factor V Leiden has been reported in patients with deep vein thrombosis, pulmonary embolus, central retinal vein occlusion, cerebral sinus thrombosis and hepatic vein thrombosis. Other risk factors to be considered in the workup for venous thrombosis include the G20210A mutation in the factor II (prothrombin) gene, protein S and C deficiency, and antithrombin deficiencies. Anticardiolipin antibody and lupus anticoagulant analysis may be appropriate for certain patients, as well as homocysteine  levels. Contact your local [facility] for information on how to order additi onal testing if desired.    Comment Comment     Comment: (NOTE) **Genetic counselors are available for health care providers to**  discuss results at 1-800-345-GENE [phone]). Methodology: DNA analysis of the Factor V gene was performed by allele-specific PCR. The diagnostic sensitivity and specificity is >99% for both. Molecular-based testing is highly accurate, but as in any laboratory test, diagnostic errors may occur. All test results must be combined with clinical information for the most accurate interpretation. This test was developed and its performance characteristics determined by [facility]. It has not been cleared or approved by the Food and Drug Administration. References: Voelkerding K (1996).  Clin Lab Med [phone]. Allison Quarry, PhD, [facility] [name], PhD, [facility] Jens Som, PhD, [facility] Annetta Maw, M.S., PhD, [facility] Alfredo Bach, PhD,  [facility] Norva Riffle, PhD, [facility] Earlean Polka PhD, [facility] Performed At: [facility] [address] [place], [place] [id number] Nelda Marseille JK:[id number]   Prothrombin gene mutation (Factor II, DNA analysis)     Status: None   Collection Time: 07/14/16  2:03 PM  Result Value Ref Range   Recommendations-PTGENE: Comment     Comment: (NOTE) NEGATIVE No mutation identified. Comment: A point mutation (G20210A) in the factor II (prothrombin) gene is the second most common cause of inherited thrombophilia. The incidence of this mutation in the [place] Caucasian population is about 2% and in the [place] American population it is approximately 0.5%. This mutation is rare in the [place] and Native American population. Being heterozygous for a prothrombin mutation increases the risk for developing venous thrombosis about 2 to 3 times above the general population risk. Being homozygous for the prothrombin gene mutation increases the  relative risk for venous thrombosis further, although it is not yet known how much further the risk is increased. In women heterozygous for the prothrombin gene mutation, the use of estrogen containing oral contraceptives increases the relative risk of venous thrombosis about 16 times and the risk of developing cerebral thrombosis is also significantly increased. In pregnancy the pr othrombin gene mutation increases risk for venous thrombosis and may increase risk for stillbirth, placental abruption, pre-eclampsia and fetal growth restriction. If the patient possesses two or more congenital or acquired thrombophilic risk factors, the risk for thrombosis may rise to more than the sum of the risk ratios for the individual mutations. This assay detects only the prothrombin G20210A mutation and does not measure genetic abnormalities elsewhere in the genome. Other thrombotic risk factors may be pursued through systematic clinical laboratory analysis. These factors include the R506Q (Leiden) mutation in the Factor V gene, plasma homocysteine levels, as well as testing for deficiencies of antithrombin III, protein C and protein S.    Additional Information Comment     Comment: (NOTE) Genetic Counselors are available for health care providers to discuss results at 1-800-345-GENE [phone]). Methodology: DNA analysis of the Factor II gene was performed by PCR amplification followed by restriction analysis. The diagnostic sensitivity is >99% for both. All the tests must be combined with clinical information for the most accurate interpretation. Molecular-based testing is highly accurate, but as in any laboratory test, diagnostic errors may occur. This test was developed and its performance characteristics determined by [facility]. It has not been cleared or approved by the Food and Drug Administration. Poort SR, et al. Blood. 1996; 33:8250-5397. Varga EA. Circulation. 2004; 673:A19-F79. Mervin Hack, et [place]; 19:700-703. Allison Quarry, PhD, [facility] [name], PhD, [facility] Jens Som, PhD, [facility] Annetta Maw, M.S., PhD, [facility] Alfredo Bach, PhD, [facility] Norva Riffle, PhD, [facility] Earlean Polka,  PhD, [facility] Performed At: [facility] [place], [place] [id number] Nechama Guard MD [id number]   Fibrin derivatives D-Dimer [facility])     Status: None   Collection Time: 07/14/16  2:03 PM  Result Value Ref Range   Fibrin derivatives D-dimer ([facility]) 411 0 - 499    Comment: <> Exclusion of Venous Thromboembolism (VTE) - OUTPATIENTS ONLY        (Emergency Department or [place])             0-499 ng/ml (FEU)  : With a low to intermediate pretest  probability for VTE this test result                                        excludes the diagnosis of VTE.           > 499 ng/ml (FEU)  : VTE not excluded.  Additional work up                                   for VTE is required.   <>  Testing on Inpatients and Evaluation of Disseminated Intravascular        Coagulation (DIC)             Reference Range:   0-499 ng/ml (FEU)   Lupus anticoagulant panel     Status: Abnormal   Collection Time: 07/14/16  2:03 PM  Result Value Ref Range   PTT Lupus Anticoagulant 40.7 0.0 - 51.9 sec   DRVVT 50.8 (H) 0.0 - 47.0 sec   Lupus Anticoag Interp Comment:     Comment: (NOTE) No lupus anticoagulant was detected. An extended dRVVT that corrects on mixing with normal plasma can be caused by a deficiency of one of the common pathway factors (X, V, II or fibrinogen). Performed At: [facility] [place], [place] [id number] Lindon Romp MD DD:[id number]   [place] genotypr     Status: None   Collection Time: 07/14/16  2:03 PM  Result Value Ref Range   JAK2 GenotypR Comment     Comment: (NOTE) Result: NEGATIVE for the JAK2 V617F mutation. Interpretation:  The G to T  nucleotide change encoding the V617F mutation was not detected.  This result does not rule out the presence of the JAK2 mutation at a level below the sensitivity of detection of this assay, or the presence of other mutations within JAK2 not detected by this assay.  This result does not rule out a diagnosis of polycythemia vera, essential thrombocythemia or idiopathic myelofibrosis as the V617F mutation is not detected in all patients with these disorders.    Director Review, JAK2 Comment     Comment: (NOTE) Jacklynn Bue MS, PhD, [place] [facility] and [place], [place] [phone]    BACKGROUND: Comment     Comment: (NOTE) JAK2 is a cytoplasmic tyrosine kinase with a key role in signal transduction from multiple hematopoietic growth factor receptors. A point mutation within exon 14 of the JAK2 gene ([id number]) encoding a valine to phenylalanine substitution at position 617 of the JAK2 protein (V617F) has been identified in most patients with polycythemia vera, and in about half of those with either essential thrombocythemia or idiopathic myelofibrosis.  The V617F has also been detected, although infrequently, in other myeloid disorders such as chronic myelomonocytic leukemia and chronic neutrophilic leukemia. V617F is an acquired mutation that alters a highly conserved valine present in the negative regulatory JH2 domain of the JAK2 protein and is predicted to dysregulate kinase activity. Methodology: Genomic DNA was purified from the provided specimen.  Allele- specific PCR using fluorescent primers was used to simultaneously amplify both the wild type and mutant alleles. Amplificati on products were analyzed by capillary electrophoresis.  This assay has a sensitivity to detect approximately a 5% population of cells containing the V617F mutation in a background of non-mutant cells. Reference:  Tefferi A and  [employer].  The JAK2 V617F Tyrosine Kinase Mutation in Myeloproliferative  Disorders:  Status Report and Immediate Implications for Disease Classification and Diagnosis. Mayo Clin Proc 2005;80(7):947-958. Performed At: [facility] [address] [place], [place] [id number] Nechama Guard MD [id number] Performed At: [facility] [address] [place], [place] [id number] Nechama Guard MD [id number]   dRVVT Mix     Status: None   Collection Time: 07/14/16  2:03 PM  Result Value Ref Range   dRVVT Mix 43.5 0.0 - 47.0 sec    Comment: (NOTE) Performed At: [facility] [place], [place] [id number] Lindon Romp MD HF:[id number]      PHQ2/9: Depression screen [facility] 2/9 09/16/2016 09/16/2016 05/29/2016 05/21/2016 03/03/2016  Decreased Interest 0 0 0 0 0  Down, Depressed, Hopeless 0 0 0 0 0  PHQ - 2 Score 0 0 0 0 0     Fall Risk: Fall Risk  09/16/2016 09/16/2016 05/29/2016 05/21/2016 03/03/2016  Falls in the past year? Yes No No No No  Number falls in past yr: 1 - - - -  Injury with Fall? No - - - -    Functional Status Survey: Is the patient deaf or have difficulty hearing?: No Does the patient have difficulty seeing, even when wearing glasses/contacts?: Yes (glasses) Does the patient have difficulty concentrating, remembering, or making decisions?: No Does the patient have difficulty walking or climbing stairs?: No Does the patient have difficulty dressing or bathing?: No Does the patient have difficulty doing errands alone such as visiting a doctor's office or shopping?: No    Assessment & Plan  1. Productive cough  It may be bronchitis versus early pneumonia, it does not seem to be CHF, started after URI, still having facial congestion, no chest pain or orthopnea. We will start antibiotics and return in one week for follow up He wants to hold off on CXR - levofloxacin (LEVAQUIN) 500 MG tablet; Take 1 tablet (500 mg total)  by mouth daily.  Dispense: 7 tablet; Refill: 0  2. Frequency of urination  Previous history of infection, he likely has symptoms secondary to cough , but we will treat with Levaquin. Discussed risk of tendon problems with levaquin , take probiotic to avoid c.difi colitis - CULTURE, URINE COMPREHENSIVE - levofloxacin (LEVAQUIN) 500 MG tablet; Take 1 tablet (500 mg total) by mouth daily.  Dispense: 7 tablet; Refill: 0  3. Chills  - levofloxacin (LEVAQUIN) 500 MG tablet; Take 1 tablet (500 mg total) by mouth daily.  Dispense: 7 tablet; Refill: 0

## 2016-09-23 ENCOUNTER — Ambulatory Visit (INDEPENDENT_AMBULATORY_CARE_PROVIDER_SITE_OTHER): Payer: Medicare Other | Admitting: Family Medicine

## 2016-09-23 ENCOUNTER — Encounter: Payer: Self-pay | Admitting: Family Medicine

## 2016-09-23 VITALS — BP 126/60 | HR 69 | Temp 98.9°F | Resp 16 | Ht 70.0 in | Wt 200.5 lb

## 2016-09-23 DIAGNOSIS — R058 Other specified cough: Secondary | ICD-10-CM

## 2016-09-23 DIAGNOSIS — R05 Cough: Secondary | ICD-10-CM

## 2016-09-23 NOTE — Progress Notes (Signed)
Name: Jose Castro   MRN: [id number]    DOB: 1926-09-08   Date:09/23/2016       Progress Note  Subjective  Chief Complaint  Chief Complaint  Patient presents with  . Cough    pt still having cough and symptoms are not as bad but still having a productve cough. Pt took last antibiotic today    HPI  Productive cough: he took last dose of antibiotics today, feeling better, still has a cough, but not always productive. He denies sob, no fever but still has occasional wheezing. Nasal congestion has improved, mild post-nasal drainage. Urinary symptoms have improved. Appetite is better, he has noticed some loose stools, likely from phlegm and also antibiotics. He denies abdominal pain or blood in stools.    Patient Active Problem List   Diagnosis Date Noted  . BPH (benign prostatic hyperplasia) 05/29/2016  . Peripheral vascular disease of lower extremity ([place]) 03/03/2016  . Gallstone 12/31/2015  . 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 Arteriopathy ([place]) 11/22/2015  . Carotid artery narrowing 11/22/2015  . Diverticulosis of colon 11/22/2015  . Decreased creatinine clearance 11/22/2015  . Arthritis, degenerative 11/22/2015  . Lactose intolerance 11/22/2015  . Basal cell carcinoma 11/22/2015  . Left-sided low back pain with left-sided sciatica 06/21/2015  . Essential hypertension 06/21/2015  . Hyperlipemia 06/21/2015  . Gastroesophageal reflux disease with esophagitis 06/21/2015  . Calculus of kidney 11/18/2013    Past Surgical History:  Procedure Laterality Date  . APPENDECTOMY    . KIDNEY STONE SURGERY    . KNEE SURGERY    . SPINE SURGERY      Family History  Problem Relation Age of Onset  . Cancer Brother     bladder cancer with mets  . Heart disease Mother   . Aortic aneurysm Mother   . Heart attack Maternal Aunt   . Heart attack Maternal Uncle     Social History   Social History  . Marital status: Widowed    Spouse name: N/A  .  Number of children: N/A  . Years of education: N/A   Occupational History  . Not on file.   Social History Main Topics  . Smoking status: Former Smoker    Years: 16.00    Types: Cigarettes    Start date: 10/21/1944    Quit date: 10/21/1960  . Smokeless tobacco: Never Used  . Alcohol use 0.0 oz/week     Comment: once a week when he goes out to dinner-socially  . Drug use: No  . Sexual activity: Yes    Partners: Female   Other Topics Concern  . Not on file   Social History Narrative  . No narrative on file     Current Outpatient Prescriptions:  .  brimonidine (ALPHAGAN) 0.2 % ophthalmic solution, Place 1 drop into both eyes 2 (two) times daily. , Disp: , Rfl:  .  Cholecalciferol (VITAMIN D3) 2000 units TABS, Take 1 tablet by mouth daily., Disp: , Rfl:  .  CIALIS 5 MG tablet, Take 5 mg by mouth daily. , Disp: , Rfl:  .  finasteride (PROSCAR) 5 MG tablet, Take 5 mg by mouth daily. , Disp: , Rfl:  .  levofloxacin (LEVAQUIN) 500 MG tablet, Take 1 tablet (500 mg total) by mouth daily., Disp: 7 tablet, Rfl: 0 .  lisinopril (PRINIVIL,ZESTRIL) 20 MG tablet, Take 1 tablet (20 mg total) by mouth 2 (two) times daily., Disp: 180 tablet, Rfl: 1 .  Omega-3  Fatty Acids (FISH OIL) 1000 MG CAPS, Take 1 capsule by mouth daily., Disp: , Rfl:  .  pantoprazole (PROTONIX) 40 MG tablet, Take 1 tablet (40 mg total) by mouth daily., Disp: 90 tablet, Rfl: 1  Allergies  Allergen Reactions  . Penicillins Itching and Swelling     ROS  Ten systems reviewed and is negative except as mentioned in HPI   Objective  Vitals:   09/23/16 1330  BP: 126/60  Pulse: 69  Resp: 16  Temp: 98.9 F (37.2 C)  TempSrc: Oral  SpO2: 95%  Weight: 200 lb 8 oz (90.9 kg)  Height: '5\' 10"'$  (1.778 m)    Body mass index is 28.77 kg/m.  Physical Exam  Constitutional: Patient appears well-developed and well-nourished. Obese  No distress.  HEENT: head atraumatic, normocephalic, pupils equal and reactive to light,  neck supple, throat within normal limits Cardiovascular: Normal rate, regular rhythm and normal heart sounds.  No murmur heard. No BLE edema. Pulmonary/Chest: Effort normal and breath sounds normal. No respiratory distress. Abdominal: Soft.  There is no tenderness. Psychiatric: Patient has a normal mood and affect. behavior is normal. Judgment and thought content normal.  Recent Results (from the past 2160 hour(s))  Factor V leiden     Status: None   Collection Time: 07/14/16  2:03 PM  Result Value Ref Range   Recommendations-F5LEID: Comment     Comment: (NOTE) Result:  Negative (no mutation found) Factor V Leiden is a specific mutation (R506Q) in the factor V gene that is associated with an increased risk of venous thrombosis. Factor V Leiden is more resistant to inactivation by activated protein C.  As a result, factor V persists in the circulation leading to a mild hyper- coagulable state.  The Leiden mutation accounts for 90% - 95% of APC resistance.  Factor V Leiden has been reported in patients with deep vein thrombosis, pulmonary embolus, central retinal vein occlusion, cerebral sinus thrombosis and hepatic vein thrombosis. Other risk factors to be considered in the workup for venous thrombosis include the G20210A mutation in the factor II (prothrombin) gene, protein S and C deficiency, and antithrombin deficiencies. Anticardiolipin antibody and lupus anticoagulant analysis may be appropriate for certain patients, as well as homocysteine levels. Contact your local [facility] for information on how to order additi onal testing if desired.    Comment Comment     Comment: (NOTE) **Genetic counselors are available for health care providers to**  discuss results at [phone]). Methodology: DNA analysis of the Factor V gene was performed by allele-specific PCR. The diagnostic sensitivity and specificity is >99% for both. Molecular-based testing is highly accurate, but  as in any laboratory test, diagnostic errors may occur. All test results must be combined with clinical information for the most accurate interpretation. This test was developed and its performance characteristics determined by [facility]. It has not been cleared or approved by the Food and Drug Administration. References: Voelkerding K (1996).  Clin Lab Med [phone]. Allison Quarry, PhD, [facility] [name], PhD, [facility] Jens Som, PhD, [facility] Annetta Maw, M.S., PhD, [facility] Alfredo Bach, PhD, [facility] Norva Riffle, PhD, [facility] Earlean Polka PhD, [facility] Performed At: [facility] [place], [place] [id number] Nelda Marseille [id number]   Prothrombin gene mutation (Factor II, DNA analysis)     Status: None   Collection Time: 07/14/16  2:03 PM  Result Value Ref Range   Recommendations-PTGENE: Comment     Comment: (NOTE) NEGATIVE No  mutation identified. Comment: A point mutation (G20210A) in the factor II (prothrombin) gene is the second most common cause of inherited thrombophilia. The incidence of this mutation in the [place] Caucasian population is about 2% and in the [place] American population it is approximately 0.5%. This mutation is rare in the [place] and Native American population. Being heterozygous for a prothrombin mutation increases the risk for developing venous thrombosis about 2 to 3 times above the general population risk. Being homozygous for the prothrombin gene mutation increases the relative risk for venous thrombosis further, although it is not yet known how much further the risk is increased. In women heterozygous for the prothrombin gene mutation, the use of estrogen containing oral contraceptives increases the relative risk of venous thrombosis about 16 times and the risk of developing cerebral thrombosis is also significantly increased. In pregnancy the pr othrombin gene mutation increases risk for venous thrombosis  and may increase risk for stillbirth, placental abruption, pre-eclampsia and fetal growth restriction. If the patient possesses two or more congenital or acquired thrombophilic risk factors, the risk for thrombosis may rise to more than the sum of the risk ratios for the individual mutations. This assay detects only the prothrombin G20210A mutation and does not measure genetic abnormalities elsewhere in the genome. Other thrombotic risk factors may be pursued through systematic clinical laboratory analysis. These factors include the R506Q (Leiden) mutation in the Factor V gene, plasma homocysteine levels, as well as testing for deficiencies of antithrombin III, protein C and protein S.    Additional Information Comment     Comment: (NOTE) Genetic Counselors are available for health care providers to discuss results at [phone]-GENE [phone]). Methodology: DNA analysis of the Factor II gene was performed by PCR amplification followed by restriction analysis. The diagnostic sensitivity is >99% for both. All the tests must be combined with clinical information for the most accurate interpretation. Molecular-based testing is highly accurate, but as in any laboratory test, diagnostic errors may occur. This test was developed and its performance characteristics determined by [facility]. It has not been cleared or approved by the Food and Drug Administration. Poort SR, et al. Blood. 1996; 96:0454-0981. Varga EA. Circulation. 2004; 191:Y78-G95. Mervin Hack, et [place]; 19:700-703. Allison Quarry, PhD, [facility] [name], PhD, [facility] Jens Som, PhD, [facility] Annetta Maw, M.S., PhD, [facility] Alfredo Bach, PhD, [facility] Norva Riffle, PhD, [facility] Earlean Polka,  PhD, [facility] Performed At: [facility] [place], [place] [id number] Nechama Guard MD QI:[id number]   Fibrin derivatives D-Dimer [facility])     Status: None   Collection  Time: 07/14/16  2:03 PM  Result Value Ref Range   Fibrin derivatives D-dimer ([facility]) 411 0 - 499    Comment: <> Exclusion of Venous Thromboembolism (VTE) - OUTPATIENTS ONLY        (Emergency Department or [place])             0-499 ng/ml (FEU)  : With a low to intermediate pretest                                        probability for VTE this test result  excludes the diagnosis of VTE.           > 499 ng/ml (FEU)  : VTE not excluded.  Additional work up                                   for VTE is required.   <>  Testing on Inpatients and Evaluation of Disseminated Intravascular        Coagulation (DIC)             Reference Range:   0-499 ng/ml (FEU)   Lupus anticoagulant panel     Status: Abnormal   Collection Time: 07/14/16  2:03 PM  Result Value Ref Range   PTT Lupus Anticoagulant 40.7 0.0 - 51.9 sec   DRVVT 50.8 (H) 0.0 - 47.0 sec   Lupus Anticoag Interp Comment:     Comment: (NOTE) No lupus anticoagulant was detected. An extended dRVVT that corrects on mixing with normal plasma can be caused by a deficiency of one of the common pathway factors (X, V, II or fibrinogen). Performed At: [facility] [place], [place] [id number] Lindon Romp MD [id number]   [place] genotypr     Status: None   Collection Time: 07/14/16  2:03 PM  Result Value Ref Range   JAK2 GenotypR Comment     Comment: (NOTE) Result: NEGATIVE for the JAK2 V617F mutation. Interpretation:  The G to T nucleotide change encoding the V617F mutation was not detected.  This result does not rule out the presence of the JAK2 mutation at a level below the sensitivity of detection of this assay, or the presence of other mutations within JAK2 not detected by this assay.  This result does not rule out a diagnosis of polycythemia vera, essential thrombocythemia or idiopathic myelofibrosis as the V617F mutation is not detected in all patients with these  disorders.    Director Review, JAK2 Comment     Comment: (NOTE) Jacklynn Bue MS, PhD, [facility] and [place], [place] [phone]    BACKGROUND: Comment     Comment: (NOTE) JAK2 is a cytoplasmic tyrosine kinase with a key role in signal transduction from multiple hematopoietic growth factor receptors. A point mutation within exon 14 of the JAK2 gene (X2119E) encoding a valine to phenylalanine substitution at position 617 of the JAK2 protein (V617F) has been identified in most patients with polycythemia vera, and in about half of those with either essential thrombocythemia or idiopathic myelofibrosis.  The V617F has also been detected, although infrequently, in other myeloid disorders such as chronic myelomonocytic leukemia and chronic neutrophilic leukemia. V617F is an acquired mutation that alters a highly conserved valine present in the negative regulatory JH2 domain of the JAK2 protein and is predicted to dysregulate kinase activity. Methodology: Genomic DNA was purified from the provided specimen.  Allele- specific PCR using fluorescent primers was used to simultaneously amplify both the wild type and mutant alleles. Amplificati on products were analyzed by capillary electrophoresis.  This assay has a sensitivity to detect approximately a 5% population of cells containing the V617F mutation in a background of non-mutant cells. Reference: Tefferi A and Gilliland DG.  The JAK2 V617F Tyrosine Kinase Mutation in Myeloproliferative  Disorders:  Status Report and Immediate Implications for Disease Classification and Diagnosis. Mayo Clin Proc 2005;80(7):947-958. Performed At: [facility] [address] [place], [place] [id number] Nechama Guard  MD [id number] Performed At: [place], [place] [id number] Nechama Guard MD [id number]   dRVVT Mix      Status: None   Collection Time: 07/14/16  2:03 PM  Result Value Ref Range   dRVVT Mix 43.5 0.0 - 47.0 sec    Comment: (NOTE) Performed At: [facility] [place], [place] [id number] Lindon Romp MD [id number]       PHQ2/9: Depression screen [facility] 2/9 09/23/2016 09/16/2016 09/16/2016 05/29/2016 05/21/2016  Decreased Interest 0 0 0 0 0  Down, Depressed, Hopeless 0 0 0 0 0  PHQ - 2 Score 0 0 0 0 0    Fall Risk: Fall Risk  09/23/2016 09/16/2016 09/16/2016 05/29/2016 05/21/2016  Falls in the past year? Yes Yes No No No  Number falls in past yr: 1 1 - - -  Injury with Fall? No No - - -  Follow up Falls evaluation completed - - - -     Functional Status Survey: Is the patient deaf or have difficulty hearing?: No Does the patient have difficulty seeing, even when wearing glasses/contacts?: Yes Does the patient have difficulty concentrating, remembering, or making decisions?: No Does the patient have difficulty walking or climbing stairs?: No Does the patient have difficulty dressing or bathing?: No Does the patient have difficulty doing errands alone such as visiting a doctor's office or shopping?: No    Assessment & Plan  1. Productive cough  Likely secondary to bronchitis, doing better Urine culture not found, we will collect specimen again today

## 2016-09-25 LAB — CULTURE, URINE COMPREHENSIVE

## 2016-09-25 LAB — TEST CODE CHANGE

## 2016-09-26 LAB — CULTURE, URINE COMPREHENSIVE

## 2016-10-01 ENCOUNTER — Other Ambulatory Visit: Payer: Self-pay | Admitting: Family Medicine

## 2016-10-01 DIAGNOSIS — K2100 Gastro-esophageal reflux disease with esophagitis, without bleeding: Secondary | ICD-10-CM

## 2016-10-01 NOTE — Telephone Encounter (Signed)
Patient requesting refill of Pantoprazole to [facility].

## 2016-10-03 ENCOUNTER — Other Ambulatory Visit: Payer: Self-pay | Admitting: Family Medicine

## 2016-10-03 DIAGNOSIS — Z87440 Personal history of urinary (tract) infections: Secondary | ICD-10-CM

## 2016-10-21 ENCOUNTER — Ambulatory Visit: Payer: Medicare Other | Admitting: Urology

## 2016-11-18 ENCOUNTER — Encounter: Payer: Self-pay | Admitting: Family Medicine

## 2016-11-18 ENCOUNTER — Ambulatory Visit (INDEPENDENT_AMBULATORY_CARE_PROVIDER_SITE_OTHER): Payer: Medicare Other | Admitting: Family Medicine

## 2016-11-18 VITALS — BP 128/67 | HR 60 | Temp 99.5°F | Resp 16 | Ht 70.0 in | Wt 202.1 lb

## 2016-11-18 DIAGNOSIS — M79671 Pain in right foot: Secondary | ICD-10-CM

## 2016-11-18 DIAGNOSIS — Z79899 Other long term (current) drug therapy: Secondary | ICD-10-CM | POA: Diagnosis not present

## 2016-11-18 DIAGNOSIS — R739 Hyperglycemia, unspecified: Secondary | ICD-10-CM

## 2016-11-18 DIAGNOSIS — E782 Mixed hyperlipidemia: Secondary | ICD-10-CM

## 2016-11-18 DIAGNOSIS — M21611 Bunion of right foot: Secondary | ICD-10-CM | POA: Diagnosis not present

## 2016-11-18 DIAGNOSIS — R202 Paresthesia of skin: Secondary | ICD-10-CM | POA: Diagnosis not present

## 2016-11-18 NOTE — Progress Notes (Signed)
Name: Jose Castro   MRN: [id number]    DOB: [birth year]   Date:11/18/2016       Progress Note  Subjective  Chief Complaint  Chief Complaint  Patient presents with  . Acute Visit    Check foot    HPI  Paresthesia legs: he states that about one year ago he noticed burning sensation on left foot, that slowly started to progress to left ankle and now up to below left knee. Over the past few months symptoms also present on right foot. It feels like he has a padding on his left foot, it has affected his balance. He states his back is no causing symptoms of radiculitis at this time - pain is only on lower leg - worse on calf than anterior leg but the entire lower leg seems numb. He denies back pain at this time. No bowel or bladder incontinence.   Right bunion pain and swelling: it happens intermittent that right bunion gets swollen and red, but not currently.   Dyslipidemia: his last HDL was low at 37, he is taking fish oil, no side effects  Hyperglycemia: his last hgbA1C was 5.7 %. He denies polyphagia, polydipsia or polyuria.    Patient Active Problem List   Diagnosis Date Noted  . BPH (benign prostatic hyperplasia) 05/29/2016  . Peripheral vascular disease of lower extremity ([place]) 03/03/2016  . Gallstone 12/31/2015  . 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 Arteriopathy ([place]) 11/22/2015  . Carotid artery narrowing 11/22/2015  . Diverticulosis of colon 11/22/2015  . Decreased creatinine clearance 11/22/2015  . Arthritis, degenerative 11/22/2015  . Lactose intolerance 11/22/2015  . Basal cell carcinoma 11/22/2015  . Left-sided low back pain with left-sided sciatica 06/21/2015  . Essential hypertension 06/21/2015  . Hyperlipemia 06/21/2015  . Gastroesophageal reflux disease with esophagitis 06/21/2015  . Calculus of kidney 11/18/2013    Past Surgical History:  Procedure Laterality Date  . APPENDECTOMY    . KIDNEY STONE SURGERY    . KNEE SURGERY     . SPINE SURGERY      Family History  Problem Relation Age of Onset  . Cancer Brother     bladder cancer with mets  . Heart disease Mother   . Aortic aneurysm Mother   . Heart attack Maternal Aunt   . Heart attack Maternal Uncle     Social History   Social History  . Marital status: Widowed    Spouse name: N/A  . Number of children: N/A  . Years of education: N/A   Occupational History  . Not on file.   Social History Main Topics  . Smoking status: Former Smoker    Years: 16.00    Types: Cigarettes    Start date: 10/21/1944    Quit date: 10/21/1960  . Smokeless tobacco: Never Used  . Alcohol use 0.0 oz/week     Comment: once a week when he goes out to dinner-socially  . Drug use: No  . Sexual activity: Yes    Partners: Female   Other Topics Concern  . Not on file   Social History Narrative  . No narrative on file     Current Outpatient Prescriptions:  .  brimonidine (ALPHAGAN) 0.2 % ophthalmic solution, Place 1 drop into both eyes 2 (two) times daily. , Disp: , Rfl:  .  Cholecalciferol (VITAMIN D3) 2000 units TABS, Take 1 tablet by mouth daily., Disp: , Rfl:  .  CIALIS 5 MG tablet, Take 5  mg by mouth daily. , Disp: , Rfl:  .  finasteride (PROSCAR) 5 MG tablet, Take 5 mg by mouth daily. , Disp: , Rfl:  .  lisinopril (PRINIVIL,ZESTRIL) 20 MG tablet, Take 1 tablet (20 mg total) by mouth 2 (two) times daily., Disp: 180 tablet, Rfl: 1 .  Omega-3 Fatty Acids (FISH OIL) 1000 MG CAPS, Take 1 capsule by mouth daily., Disp: , Rfl:  .  pantoprazole (PROTONIX) 40 MG tablet, TAKE 1 TABLET BY MOUTH DAILY, Disp: 90 tablet, Rfl: 0  Allergies  Allergen Reactions  . Penicillins Itching and Swelling     ROS  Ten systems reviewed and is negative except as mentioned in HPI   Objective  Vitals:   11/18/16 1231  BP: 128/67  Pulse: 60  Resp: 16  Temp: 99.5 F (37.5 C)  TempSrc: Oral  SpO2: 96%  Weight: 202 lb 1.6 oz (91.7 kg)  Height: '5\' 10"'$  (1.778 m)    Body  mass index is 29 kg/m.  Physical Exam  Constitutional: Patient appears well-developed and well-nourished.  No distress.  HEENT: head atraumatic, normocephalic, pupils equal and reactive to light, neck supple, throat within normal limits Cardiovascular: Normal rate, regular rhythm and normal heart sounds.  No murmur heard. No BLE edema. Pulmonary/Chest: Effort normal and breath sounds normal. No respiratory distress. Abdominal: Soft.  There is no tenderness. Psychiatric: Patient has a normal mood and affect. behavior is normal. Judgment and thought content normal. Neurological: decrease in sensation to monofilament test on left foot, normal distal pulses, no pain Muscular Skeletal: decrease in rom of lumbar spine during extension, and lateral bending, no pain during palpation of lumbar spine and negative straight leg raise  Recent Results (from the past 2160 hour(s))  TEST CODE CHANGE     Status: None   Collection Time: 09/17/16 12:00 AM  Result Value Ref Range   Test Code Change Comment     Comment: Please note that the Microbiology test code was changed to reflect the specimen source or transport received.   CULTURE, URINE COMPREHENSIVE     Status: Abnormal   Collection Time: 09/17/16 12:00 AM  Result Value Ref Range   Urine Culture, Comprehensive Final report (A)    Result 1 Enterococcus species (A)     Comment: 10,000-25,000 colony forming units per mL Note: this isolate is vancomycin-susceptible. This information is provided for epidemiologic purposes only: vancomycin is not among the antibiotics recommended for therapy of urinary tract infections caused by Enterococcus.    ANTIMICROBIAL SUSCEPTIBILITY Comment     Comment:       ** S = Susceptible; I = Intermediate; R = Resistant **                    P = Positive; N = Negative             MICS are expressed in micrograms per mL    Antibiotic                 RSLT#1    RSLT#2    RSLT#3    RSLT#4 Ciprofloxacin                   S Levofloxacin                   S Nitrofurantoin                 S Penicillin  S Tetracycline                   R Vancomycin                     S   CULTURE, URINE COMPREHENSIVE     Status: None   Collection Time: 09/23/16  2:36 PM  Result Value Ref Range   Culture      Coagulase negative staphylococcus, not S.saprophyticus     Comment: Rifampin and Gentamicin should not be used as single drugs for treatment of Staph infections. This organism is presumed to be Clindamycin resistant based on detection of inducible Clindamycin resistance.    Colony Count 1,000-10,000 CFU/mL    Organism ID, Bacteria      Coagulase negative staphylococcus, not S.saprophyticus       Susceptibility   Coagulase negative staphylococcus, not s.saprophyticus -  (no method available)    OXACILLIN 0.5 Resistant     CEFAZOLIN  Resistant     GENTAMICIN 1 Sensitive     CIPROFLOXACIN >=8 Resistant     LEVOFLOXACIN >=8 Resistant     NITROFURANTOIN <=16 Sensitive     TRIMETH/SULFA 80 Resistant     VANCOMYCIN <=0.5 Sensitive     RIFAMPIN <=0.5 Sensitive     TETRACYCLINE >=16 Resistant       PHQ2/9: Depression screen [facility] 2/9 11/18/2016 09/23/2016 09/16/2016 09/16/2016 05/29/2016  Decreased Interest 0 0 0 0 0  Down, Depressed, Hopeless 0 0 0 0 0  PHQ - 2 Score 0 0 0 0 0     Fall Risk: Fall Risk  11/18/2016 09/23/2016 09/16/2016 09/16/2016 05/29/2016  Falls in the past year? No Yes Yes No No  Number falls in past yr: - 1 1 - -  Injury with Fall? - No No - -  Follow up - Falls evaluation completed - - -      Functional Status Survey: Is the patient deaf or have difficulty hearing?: No Does the patient have difficulty seeing, even when wearing glasses/contacts?: Yes Does the patient have difficulty concentrating, remembering, or making decisions?: No Does the patient have difficulty walking or climbing stairs?: No Does the patient have difficulty dressing or bathing?:  No Does the patient have difficulty doing errands alone such as visiting a doctor's office or shopping?: No    Assessment & Plan  1. Paresthesia of both lower extremities  He had decreased in sensation with monofilament test on LLExtremity, we will find out the cause before starting gabapentin  - Vitamin B12 - NCV with EMG(electromyography); Future  2. Bunion, right  He states at times it gets red and swollen, we will check uric acid  3. Right foot pain  Not currently  - Uric acid  4. Mixed hyperlipidemia  - Lipid panel  5. Hyperglycemia  - Hemoglobin A1c  6. Long-term use of high-risk medication  - COMPLETE METABOLIC PANEL WITH GFR - CBC with Differential/Platelet

## 2016-11-19 ENCOUNTER — Other Ambulatory Visit: Payer: Self-pay | Admitting: Family Medicine

## 2016-11-20 ENCOUNTER — Other Ambulatory Visit: Payer: Self-pay | Admitting: Family Medicine

## 2016-11-20 DIAGNOSIS — E790 Hyperuricemia without signs of inflammatory arthritis and tophaceous disease: Secondary | ICD-10-CM | POA: Insufficient documentation

## 2016-11-20 LAB — LIPID PANEL W/O CHOL/HDL RATIO
CHOLESTEROL TOTAL: 220 mg/dL — AB (ref 100–199)
HDL: 35 mg/dL — ABNORMAL LOW (ref >39)
LDL Calculated: 137 mg/dL — ABNORMAL HIGH (ref 0–99)
TRIGLYCERIDES: 239 mg/dL — AB (ref 0–149)
VLDL Cholesterol Cal: 48 mg/dL — ABNORMAL HIGH (ref 5–40)

## 2016-11-20 LAB — HGB A1C W/O EAG: HEMOGLOBIN A1C: 5.9 % — AB (ref 4.8–5.6)

## 2016-11-20 LAB — CBC WITH DIFFERENTIAL/PLATELET
Basophils Absolute: 0.1 10*3/uL (ref 0.0–0.2)
Basos: 1 %
EOS (ABSOLUTE): 0.5 10*3/uL — ABNORMAL HIGH (ref 0.0–0.4)
Eos: 5 %
Hematocrit: 41.6 % (ref 37.5–51.0)
Hemoglobin: 14.1 g/dL (ref 13.0–17.7)
IMMATURE GRANULOCYTES: 0 %
Immature Grans (Abs): 0 10*3/uL (ref 0.0–0.1)
Lymphocytes Absolute: 2.8 10*3/uL (ref 0.7–3.1)
Lymphs: 30 %
MCH: 28.5 pg (ref 26.6–33.0)
MCHC: 33.9 g/dL (ref 31.5–35.7)
MCV: 84 fL (ref 79–97)
MONOS ABS: 1 10*3/uL — AB (ref 0.1–0.9)
Monocytes: 11 %
NEUTROS PCT: 53 %
Neutrophils Absolute: 5.1 10*3/uL (ref 1.4–7.0)
Platelets: 343 10*3/uL (ref 150–379)
RBC: 4.95 x10E6/uL (ref 4.14–5.80)
RDW: 15.6 % — AB (ref 12.3–15.4)
WBC: 9.4 10*3/uL (ref 3.4–10.8)

## 2016-11-20 LAB — COMPREHENSIVE METABOLIC PANEL
A/G RATIO: 1.8 (ref 1.2–2.2)
ALK PHOS: 70 IU/L (ref 39–117)
ALT: 9 IU/L (ref 0–44)
AST: 11 IU/L (ref 0–40)
Albumin: 4.1 g/dL (ref 3.5–4.7)
BUN/Creatinine Ratio: 21 (ref 10–24)
BUN: 25 mg/dL (ref 8–27)
Bilirubin Total: 0.3 mg/dL (ref 0.0–1.2)
CALCIUM: 9.3 mg/dL (ref 8.6–10.2)
CO2: 23 mmol/L (ref 18–29)
Chloride: 106 mmol/L (ref 96–106)
Creatinine, Ser: 1.17 mg/dL (ref 0.76–1.27)
GFR calc Af Amer: 64 mL/min/{1.73_m2} (ref >59)
GFR, EST NON AFRICAN AMERICAN: 55 mL/min/{1.73_m2} — AB (ref >59)
Globulin, Total: 2.3 g/dL (ref 1.5–4.5)
Glucose: 100 mg/dL — ABNORMAL HIGH (ref 65–99)
POTASSIUM: 5 mmol/L (ref 3.5–5.2)
Sodium: 144 mmol/L (ref 134–144)
Total Protein: 6.4 g/dL (ref 6.0–8.5)

## 2016-11-20 LAB — URIC ACID: Uric Acid: 6.9 mg/dL (ref 3.7–8.6)

## 2016-11-20 LAB — VITAMIN B12: Vitamin B-12: 347 pg/mL (ref 232–1245)

## 2016-12-13 ENCOUNTER — Other Ambulatory Visit: Payer: Self-pay | Admitting: Family Medicine

## 2016-12-13 DIAGNOSIS — K2100 Gastro-esophageal reflux disease with esophagitis, without bleeding: Secondary | ICD-10-CM

## 2017-01-22 ENCOUNTER — Other Ambulatory Visit: Payer: Self-pay | Admitting: Urology

## 2017-01-22 DIAGNOSIS — R109 Unspecified abdominal pain: Secondary | ICD-10-CM

## 2017-01-29 ENCOUNTER — Encounter (INDEPENDENT_AMBULATORY_CARE_PROVIDER_SITE_OTHER): Payer: Medicare Other

## 2017-01-29 ENCOUNTER — Ambulatory Visit (INDEPENDENT_AMBULATORY_CARE_PROVIDER_SITE_OTHER): Payer: Self-pay | Admitting: Vascular Surgery

## 2017-01-29 ENCOUNTER — Ambulatory Visit
Admission: RE | Admit: 2017-01-29 | Discharge: 2017-01-29 | Disposition: A | Discharge status: Home / Self Care | Payer: Medicare Other | Source: Ambulatory Visit | Attending: Urology | Admitting: Urology

## 2017-01-29 DIAGNOSIS — N400 Enlarged prostate without lower urinary tract symptoms: Secondary | ICD-10-CM | POA: Diagnosis not present

## 2017-01-29 DIAGNOSIS — I700 Atherosclerosis of aorta: Secondary | ICD-10-CM | POA: Insufficient documentation

## 2017-01-29 DIAGNOSIS — N281 Cyst of kidney, acquired: Secondary | ICD-10-CM | POA: Insufficient documentation

## 2017-01-29 DIAGNOSIS — K5730 Diverticulosis of large intestine without perforation or abscess without bleeding: Secondary | ICD-10-CM | POA: Diagnosis not present

## 2017-01-29 DIAGNOSIS — R109 Unspecified abdominal pain: Secondary | ICD-10-CM

## 2017-01-29 DIAGNOSIS — K8020 Calculus of gallbladder without cholecystitis without obstruction: Secondary | ICD-10-CM | POA: Insufficient documentation

## 2017-01-29 LAB — POCT I-STAT CREATININE: Creatinine, Ser: 1.2 mg/dL (ref 0.61–1.24)

## 2017-01-29 MED ORDER — IOPAMIDOL (ISOVUE-300) INJECTION 61%
100.0000 mL | Freq: Once | INTRAVENOUS | Status: AC | PRN
  Administered 2017-01-29: 100 mL via INTRAVENOUS

## 2017-02-23 ENCOUNTER — Other Ambulatory Visit: Payer: Self-pay

## 2017-02-23 DIAGNOSIS — I10 Essential (primary) hypertension: Secondary | ICD-10-CM

## 2017-02-23 MED ORDER — LISINOPRIL 20 MG PO TABS: 20.0000 mg | ORAL_TABLET | Freq: Two times a day (BID) | ORAL | 0 refills | Status: DC

## 2017-02-23 NOTE — Telephone Encounter (Signed)
Patient requesting refill of Lisinopril to [facility].

## 2017-03-10 ENCOUNTER — Ambulatory Visit (INDEPENDENT_AMBULATORY_CARE_PROVIDER_SITE_OTHER): Payer: Medicare Other | Admitting: Family Medicine

## 2017-03-10 ENCOUNTER — Encounter: Payer: Self-pay | Admitting: Family Medicine

## 2017-03-10 VITALS — BP 128/68 | HR 64 | Temp 98.8°F | Resp 16 | Ht 70.0 in | Wt 202.6 lb

## 2017-03-10 DIAGNOSIS — M546 Pain in thoracic spine: Secondary | ICD-10-CM

## 2017-03-10 DIAGNOSIS — M47816 Spondylosis without myelopathy or radiculopathy, lumbar region: Secondary | ICD-10-CM | POA: Insufficient documentation

## 2017-03-10 MED ORDER — TIZANIDINE HCL 2 MG PO CAPS: 2.0000 mg | ORAL_CAPSULE | Freq: Every evening | ORAL | 0 refills | Status: DC | PRN

## 2017-03-10 MED ORDER — MELOXICAM 15 MG PO TABS: 15.0000 mg | ORAL_TABLET | Freq: Every day | ORAL | 0 refills | Status: DC

## 2017-03-10 NOTE — Progress Notes (Signed)
Name: Jose Castro   MRN: [id number]    DOB: 08/28/1926   Date:03/10/2017       Progress Note  Subjective  Chief Complaint  Chief Complaint  Patient presents with  . Back Pain    HPI  Acute thoracic back pain: he was watching TV while laying in bed and turned to look at the TV and developed acute onset of mid thoracic back pain, described as aching and throbbing and more intense with rotation of spine. No bowel or bladder incontinence, no fever, no urinary frequency or burning. Appetite has been normal. Tylenol and rest seems to help symptoms. Right no pain is down to 5/10, it was up to 10/10 in the beginning.    Patient Active Problem List   Diagnosis Date Noted  . Lumbar spondylosis 03/10/2017  . Elevated uric acid in blood 11/20/2016  . BPH (benign prostatic hyperplasia) 05/29/2016  . Peripheral vascular disease of lower extremity ([place]) 03/03/2016  . Gallstone 12/31/2015  . 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 Atherosclerosis of aorta ([place]) 11/22/2015  . Carotid artery narrowing 11/22/2015  . Diverticulosis of colon 11/22/2015  . Decreased creatinine clearance 11/22/2015  . Arthritis, degenerative 11/22/2015  . Lactose intolerance 11/22/2015  . Basal cell carcinoma 11/22/2015  . Essential hypertension 06/21/2015  . Hyperlipemia 06/21/2015  . Gastroesophageal reflux disease with esophagitis 06/21/2015  . Calculus of kidney 11/18/2013    Past Surgical History:  Procedure Laterality Date  . APPENDECTOMY    . KIDNEY STONE SURGERY    . KNEE SURGERY    . SPINE SURGERY      Family History  Problem Relation Age of Onset  . Cancer Brother     bladder cancer with mets  . Heart disease Mother   . Aortic aneurysm Mother   . Heart attack Maternal Aunt   . Heart attack Maternal Uncle     Social History   Social History  . Marital status: Widowed    Spouse name: N/A  . Number of children: N/A  . Years of education: N/A   Occupational  History  . Not on file.   Social History Main Topics  . Smoking status: Former Smoker    Years: 16.00    Types: Cigarettes    Start date: 10/21/1944    Quit date: 10/21/1960  . Smokeless tobacco: Never Used  . Alcohol use 0.0 oz/week     Comment: once a week when he goes out to dinner-socially  . Drug use: No  . Sexual activity: Yes    Partners: Female   Other Topics Concern  . Not on file   Social History Narrative  . No narrative on file     Current Outpatient Prescriptions:  .  brimonidine (ALPHAGAN) 0.2 % ophthalmic solution, Place 1 drop into both eyes 2 (two) times daily. , Disp: , Rfl:  .  Cholecalciferol (VITAMIN D3) 2000 units TABS, Take 1 tablet by mouth daily., Disp: , Rfl:  .  CIALIS 5 MG tablet, Take 5 mg by mouth daily. , Disp: , Rfl:  .  finasteride (PROSCAR) 5 MG tablet, Take 5 mg by mouth daily. , Disp: , Rfl:  .  lisinopril (PRINIVIL,ZESTRIL) 20 MG tablet, Take 1 tablet (20 mg total) by mouth 2 (two) times daily., Disp: 180 tablet, Rfl: 0 .  meloxicam (MOBIC) 15 MG tablet, Take 1 tablet (15 mg total) by mouth daily., Disp: 30 tablet, Rfl: 0 .  Omega-3 Fatty Acids (FISH  OIL) 1000 MG CAPS, Take 1 capsule by mouth daily., Disp: , Rfl:  .  pantoprazole (PROTONIX) 40 MG tablet, TAKE ONE TABLET EVERY DAY, Disp: 90 tablet, Rfl: 1 .  tizanidine (ZANAFLEX) 2 MG capsule, Take 1 capsule (2 mg total) by mouth at bedtime as needed for muscle spasms., Disp: 30 capsule, Rfl: 0  Allergies  Allergen Reactions  . Penicillins Itching and Swelling     ROS  Ten systems reviewed and is negative except as mentioned in HPI , denies urinary problems or change in bowel movements, no incontinence. Off blood thinners  Objective  Vitals:   03/10/17 1439  BP: 128/68  Pulse: 64  Resp: 16  Temp: 98.8 F (37.1 C)  SpO2: 94%  Weight: 202 lb 9 oz (91.9 kg)  Height: '5\' 10"'$  (1.778 m)    Body mass index is 29.06 kg/m.  Physical Exam  Constitutional: Patient appears  well-developed and well-nourished. Obese  No distress.  HEENT: head atraumatic, normocephalic, pupils equal and reactive to light,  neck supple, throat within normal limits Cardiovascular: Normal rate, regular rhythm and normal heart sounds.  No murmur heard. No BLE edema. Pulmonary/Chest: Effort normal and breath sounds normal. No respiratory distress. Abdominal: Soft.  There is no tenderness. Psychiatric: Patient has a normal mood and affect. behavior is normal. Judgment and thought content normal. Muscular Skeletal: normal flexion, pain with extension and lateral bending, no pain during palpation of spinal processes but has pain with palpation of para spinal muscles  Recent Results (from the past 2160 hour(s))  I-STAT creatinine     Status: None   Collection Time: 01/29/17  2:37 PM  Result Value Ref Range   Creatinine, Ser 1.20 0.61 - 1.24 mg/dL      PHQ2/9: Depression screen [facility] 2/9 11/18/2016 09/23/2016 09/16/2016 09/16/2016 05/29/2016  Decreased Interest 0 0 0 0 0  Down, Depressed, Hopeless 0 0 0 0 0  PHQ - 2 Score 0 0 0 0 0     Fall Risk: Fall Risk  11/18/2016 09/23/2016 09/16/2016 09/16/2016 05/29/2016  Falls in the past year? No Yes Yes No No  Number falls in past yr: - 1 1 - -  Injury with Fall? - No No - -  Follow up - Falls evaluation completed - - -     Assessment & Plan  1. Acute midline thoracic back pain  Explained that Meloxicam is just for one week, take Tizanidine qhs only and it may cause sedation - meloxicam (MOBIC) 15 MG tablet; Take 1 tablet (15 mg total) by mouth daily.  Dispense: 30 tablet; Refill: 0 - tizanidine (ZANAFLEX) 2 MG capsule; Take 1 capsule (2 mg total) by mouth at bedtime as needed for muscle spasms.  Dispense: 30 capsule; Refill: 0  Discussed X-ray but he would like to hold off for now, explained that it could be a thoracic spine fracture because of his age.   2. Lumbar spondylosis  On CT done by Dr. Annie Sable

## 2017-03-16 ENCOUNTER — Other Ambulatory Visit: Payer: Self-pay | Admitting: Family Medicine

## 2017-03-16 DIAGNOSIS — K2100 Gastro-esophageal reflux disease with esophagitis, without bleeding: Secondary | ICD-10-CM

## 2017-03-16 NOTE — Telephone Encounter (Signed)
Patient requesting refill of Pantoprazole to [facility].

## 2017-03-25 ENCOUNTER — Other Ambulatory Visit (INDEPENDENT_AMBULATORY_CARE_PROVIDER_SITE_OTHER): Payer: Self-pay | Admitting: Vascular Surgery

## 2017-03-25 DIAGNOSIS — I6529 Occlusion and stenosis of unspecified carotid artery: Secondary | ICD-10-CM

## 2017-03-26 ENCOUNTER — Encounter (INDEPENDENT_AMBULATORY_CARE_PROVIDER_SITE_OTHER): Payer: Self-pay | Admitting: Vascular Surgery

## 2017-03-26 ENCOUNTER — Ambulatory Visit (INDEPENDENT_AMBULATORY_CARE_PROVIDER_SITE_OTHER): Payer: Medicare Other | Admitting: Vascular Surgery

## 2017-03-26 ENCOUNTER — Ambulatory Visit (INDEPENDENT_AMBULATORY_CARE_PROVIDER_SITE_OTHER): Payer: Medicare Other

## 2017-03-26 VITALS — BP 124/60 | HR 46 | Resp 16 | Wt 201.0 lb

## 2017-03-26 DIAGNOSIS — I6523 Occlusion and stenosis of bilateral carotid arteries: Secondary | ICD-10-CM

## 2017-03-26 DIAGNOSIS — I82890 Acute embolism and thrombosis of other specified veins: Secondary | ICD-10-CM | POA: Diagnosis not present

## 2017-03-26 DIAGNOSIS — I739 Peripheral vascular disease, unspecified: Secondary | ICD-10-CM

## 2017-03-26 DIAGNOSIS — I10 Essential (primary) hypertension: Secondary | ICD-10-CM

## 2017-03-26 DIAGNOSIS — I6529 Occlusion and stenosis of unspecified carotid artery: Secondary | ICD-10-CM | POA: Diagnosis not present

## 2017-03-26 DIAGNOSIS — E782 Mixed hyperlipidemia: Secondary | ICD-10-CM | POA: Diagnosis not present

## 2017-03-26 NOTE — Progress Notes (Signed)
MRN : [id number]  Jose Castro is a 83 y.o. (12/13/1925) male who presents with chief complaint of  Chief Complaint  Patient presents with  . Follow-up  .  History of Present Illness: The patient is seen for follow up evaluation of carotid stenosis. The carotid stenosis followed by ultrasound.   The patient denies amaurosis fugax. There is no recent history of TIA symptoms or focal motor deficits. There is no prior documented CVA.  The patient is taking enteric-coated aspirin 81 mg daily.  There is no history of migraine headaches. There is no history of seizures.  He also has a history of spontaneous splenic vein infarct.  No abdominal pain.  Hypercoagulable state is followed by Dr Burlene Arnt  The patient has a history of coronary artery disease, no recent episodes of angina or shortness of breath. The patient denies PAD or claudication symptoms. There is a history of hyperlipidemia which is being treated with a statin.    Carotid Duplex done today shows <40%.  No change compared to last study in 01/21/2016 Duplex ultrasound of the mesenteric veins shows the splenic vein is patent  Current Meds  Medication Sig  . brimonidine (ALPHAGAN) 0.2 % ophthalmic solution Place 1 drop into both eyes 2 (two) times daily.   . Cholecalciferol (VITAMIN D3) 2000 units TABS Take 1 tablet by mouth daily.  Marland Kitchen CIALIS 5 MG tablet Take 5 mg by mouth daily.   . finasteride (PROSCAR) 5 MG tablet Take 5 mg by mouth daily.   Marland Kitchen lisinopril (PRINIVIL,ZESTRIL) 20 MG tablet Take 1 tablet (20 mg total) by mouth 2 (two) times daily.  . meloxicam (MOBIC) 15 MG tablet Take 1 tablet (15 mg total) by mouth daily.  . Omega-3 Fatty Acids (FISH OIL) 1000 MG CAPS Take 1 capsule by mouth daily.  . pantoprazole (PROTONIX) 40 MG tablet TAKE ONE TABLET EVERY DAY  . tizanidine (ZANAFLEX) 2 MG capsule Take 1 capsule (2 mg total) by mouth at bedtime as needed for muscle spasms.    Past Medical History:  Diagnosis Date  .  BPH (benign prostatic hyperplasia)   . Chronic kidney disease    had a kidney stone which per family was a cyst that was removed  . GERD (gastroesophageal reflux disease)   . Hyperlipidemia   . Hypertension   . Splenic vein thrombosis   . Thrombosis 12/2015   mural  area and no notation of heart attack    Past Surgical History:  Procedure Laterality Date  . APPENDECTOMY    . KIDNEY STONE SURGERY    . KNEE SURGERY    . SPINE SURGERY      Social History Social History  Substance Use Topics  . Smoking status: Former Smoker    Years: 16.00    Types: Cigarettes    Start date: 10/21/1944    Quit date: 10/21/1960  . Smokeless tobacco: Never Used  . Alcohol use 0.0 oz/week     Comment: once a week when he goes out to dinner-socially    Family History Family History  Problem Relation Age of Onset  . Cancer Brother     bladder cancer with mets  . Heart disease Mother   . Aortic aneurysm Mother   . Heart attack Maternal Aunt   . Heart attack Maternal Uncle     Allergies  Allergen Reactions  . Penicillins Itching and Swelling     REVIEW OF SYSTEMS (Negative unless checked)  Constitutional: '[]'$ Weight loss  '[]'$ Fever  '[]'$   Chills Cardiac: '[]'$ Chest pain   '[]'$ Chest pressure   '[]'$ Palpitations   '[]'$ Shortness of breath when laying flat   '[]'$ Shortness of breath with exertion. Vascular:  '[]'$ Pain in legs with walking   '[]'$ Pain in legs at rest  '[]'$ History of DVT   '[]'$ Phlebitis   '[]'$ Swelling in legs   '[]'$ Varicose veins   '[]'$ Non-healing ulcers Pulmonary:   '[]'$ Uses home oxygen   '[]'$ Productive cough   '[]'$ Hemoptysis   '[]'$ Wheeze  '[]'$ COPD   '[]'$ Asthma Neurologic:  '[]'$ Dizziness   '[]'$ Seizures   '[]'$ History of stroke   '[]'$ History of TIA  '[]'$ Aphasia   '[]'$ Vissual changes   '[]'$ Weakness or numbness in arm   '[]'$ Weakness or numbness in leg Musculoskeletal:   '[]'$ Joint swelling   '[]'$ Joint pain   '[]'$ Low back pain Hematologic:  '[]'$ Easy bruising  '[]'$ Easy bleeding   '[]'$ Hypercoagulable state   '[]'$ Anemic Gastrointestinal:  '[]'$ Diarrhea   '[]'$ Vomiting   '[]'$ Gastroesophageal reflux/heartburn   '[]'$ Difficulty swallowing. Genitourinary:  '[]'$ Chronic kidney disease   '[]'$ Difficult urination  '[]'$ Frequent urination   '[]'$ Blood in urine Skin:  '[]'$ Rashes   '[]'$ Ulcers  Psychological:  '[]'$ History of anxiety   '[]'$  History of major depression.  Physical Examination  Vitals:   03/26/17 1102 03/26/17 1103  BP: (!) 143/66 124/60  Pulse: (!) 46   Resp: 16   Weight: 91.2 kg (201 lb)    Body mass index is 28.84 kg/m. Gen: WD/WN, NAD Head: [place]/AT, No temporalis wasting.  Ear/Nose/Throat: Hearing grossly intact, nares w/o erythema or drainage Eyes: PER, EOMI, sclera nonicteric.  Neck: Supple, no large masses.   Pulmonary:  Good air movement, no audible wheezing bilaterally, no use of accessory muscles.  Cardiac: RRR, no JVD Vascular:  Vessel Right Left  Radial Palpable Palpable  Ulnar Palpable Palpable  Brachial Palpable Palpable  Carotid Palpable Palpable  Femoral Palpable Palpable  Popliteal Palpable Palpable  PT Palpable Palpable  DP Palpable Palpable  Gastrointestinal: Non-distended. No guarding/no peritoneal signs.  Musculoskeletal: M/S 5/5 throughout.  No deformity or atrophy.  Neurologic: CN 2-12 intact. Symmetrical.  Speech is fluent. Motor exam as listed above. Psychiatric: Judgment intact, Mood & affect appropriate for pt's clinical situation. Dermatologic: No rashes or ulcers noted.  No changes consistent with cellulitis. Lymph : No lichenification or skin changes of chronic lymphedema.  CBC Lab Results  Component Value Date   WBC 9.4 11/19/2016   HGB 13.7 11/30/2015   HCT 41.6 11/19/2016   MCV 84 11/19/2016   PLT 343 11/19/2016    BMET    Component Value Date/Time   NA 144 11/19/2016 0955   NA 138 11/27/2013 0819   K 5.0 11/19/2016 0955   K 4.1 11/27/2013 0819   CL 106 11/19/2016 0955   CL 107 11/27/2013 0819   CO2 23 11/19/2016 0955   CO2 23 11/27/2013 0819   GLUCOSE 100 (H) 11/19/2016 0955   GLUCOSE 101 (H) 11/28/2015 0438    GLUCOSE 101 (H) 11/27/2013 0819   BUN 25 11/19/2016 0955   BUN 22 (H) 11/27/2013 0819   CREATININE 1.20 01/29/2017 1437   CREATININE 1.26 11/27/2013 0819   CALCIUM 9.3 11/19/2016 0955   CALCIUM 8.2 (L) 11/27/2013 0819   GFRNONAA 55 (L) 11/19/2016 0955   GFRNONAA 52 (L) 11/27/2013 0819   GFRAA 64 11/19/2016 0955   GFRAA 60 (L) 11/27/2013 0819   CrCl cannot be calculated (Patient's most recent lab result is older than the maximum 21 days allowed.).  COAG Lab Results  Component Value Date   INR 1.1 12/17/2015   INR  1.1 12/17/2015   INR 1.29 11/28/2015    Radiology No results found.  Assessment/Plan 1. Bilateral carotid artery stenosis Recommend:  Given the patient's asymptomatic subcritical stenosis no further invasive testing or surgery at this time.  Duplex ultrasound shows <50% stenosis bilaterally.  Continue antiplatelet therapy as prescribed Continue management of CAD, HTN and Hyperlipidemia Healthy heart diet,  encouraged exercise at least 4 times per week Follow up PRN given his age and his low plaque burden   2. Peripheral vascular disease of lower extremity (HCC) Recommend:  I do not find evidence of significant vascular pathology.  The patient has atypical pain symptoms for vascular disease  The patient should continue walking and begin a more formal exercise program. The patient should continue his antiplatelet therapy and aggressive treatment of the lipid abnormalities. The patient should begin wearing graduated compression socks 15-20 mmHg strength to control her mild edema.  Patient will follow-up with me on a PRN basis   3. Splenic vein thrombosis No intervention at this time continue to follow up with oncology  4. Essential hypertension Continue antihypertensive medications as already ordered, these medications have been reviewed and there are no changes at this time.   5. Mixed hyperlipidemia Continue statin as ordered and reviewed, no  changes at this time     Hortencia Pilar, MD  03/26/2017 10:09 PM

## 2017-04-06 ENCOUNTER — Other Ambulatory Visit: Payer: Self-pay | Admitting: Family Medicine

## 2017-04-06 DIAGNOSIS — I10 Essential (primary) hypertension: Secondary | ICD-10-CM

## 2017-04-06 NOTE — Telephone Encounter (Signed)
Patient requesting refill of lisinopril to [facility].

## 2017-06-10 ENCOUNTER — Other Ambulatory Visit: Payer: Self-pay | Admitting: Family Medicine

## 2017-06-10 DIAGNOSIS — K2100 Gastro-esophageal reflux disease with esophagitis, without bleeding: Secondary | ICD-10-CM

## 2017-06-10 NOTE — Telephone Encounter (Signed)
Patient requesting refill of Pantoprazole to [facility] pharmacy.

## 2017-07-07 ENCOUNTER — Ambulatory Visit (INDEPENDENT_AMBULATORY_CARE_PROVIDER_SITE_OTHER): Payer: Medicare Other | Admitting: Family Medicine

## 2017-07-07 ENCOUNTER — Encounter: Payer: Self-pay | Admitting: Family Medicine

## 2017-07-07 VITALS — BP 122/74 | HR 54 | Temp 97.8°F | Resp 16 | Ht 70.0 in | Wt 202.4 lb

## 2017-07-07 DIAGNOSIS — I10 Essential (primary) hypertension: Secondary | ICD-10-CM | POA: Diagnosis not present

## 2017-07-07 DIAGNOSIS — K2100 Gastro-esophageal reflux disease with esophagitis, without bleeding: Secondary | ICD-10-CM

## 2017-07-07 DIAGNOSIS — R202 Paresthesia of skin: Secondary | ICD-10-CM | POA: Diagnosis not present

## 2017-07-07 DIAGNOSIS — R944 Abnormal results of kidney function studies: Secondary | ICD-10-CM

## 2017-07-07 DIAGNOSIS — I6523 Occlusion and stenosis of bilateral carotid arteries: Secondary | ICD-10-CM

## 2017-07-07 DIAGNOSIS — E782 Mixed hyperlipidemia: Secondary | ICD-10-CM

## 2017-07-07 DIAGNOSIS — I700 Atherosclerosis of aorta: Secondary | ICD-10-CM | POA: Diagnosis not present

## 2017-07-07 DIAGNOSIS — E559 Vitamin D deficiency, unspecified: Secondary | ICD-10-CM

## 2017-07-07 DIAGNOSIS — R7303 Prediabetes: Secondary | ICD-10-CM

## 2017-07-07 DIAGNOSIS — R001 Bradycardia, unspecified: Secondary | ICD-10-CM | POA: Diagnosis not present

## 2017-07-07 DIAGNOSIS — Z8379 Family history of other diseases of the digestive system: Secondary | ICD-10-CM | POA: Diagnosis not present

## 2017-07-07 DIAGNOSIS — D689 Coagulation defect, unspecified: Secondary | ICD-10-CM | POA: Insufficient documentation

## 2017-07-07 DIAGNOSIS — I739 Peripheral vascular disease, unspecified: Secondary | ICD-10-CM

## 2017-07-07 LAB — CBC WITH DIFFERENTIAL/PLATELET
Basophils Absolute: 68 cells/uL (ref 0–200)
Basophils Relative: 1 %
EOS PCT: 4 %
Eosinophils Absolute: 272 cells/uL (ref 15–500)
HCT: 44.1 % (ref 38.5–50.0)
HEMOGLOBIN: 14.7 g/dL (ref 13.2–17.1)
LYMPHS ABS: 1904 {cells}/uL (ref 850–3900)
Lymphocytes Relative: 28 %
MCH: 29.4 pg (ref 27.0–33.0)
MCHC: 33.3 g/dL (ref 32.0–36.0)
MCV: 88.2 fL (ref 80.0–100.0)
MONOS PCT: 14 %
MPV: 10.6 fL (ref 7.5–12.5)
Monocytes Absolute: 952 cells/uL — ABNORMAL HIGH (ref 200–950)
NEUTROS ABS: 3604 {cells}/uL (ref 1500–7800)
NEUTROS PCT: 53 %
PLATELETS: 291 10*3/uL (ref 140–400)
RBC: 5 MIL/uL (ref 4.20–5.80)
RDW: 14.2 % (ref 11.0–15.0)
WBC: 6.8 10*3/uL (ref 3.8–10.8)

## 2017-07-07 MED ORDER — LISINOPRIL 20 MG PO TABS: 20.0000 mg | ORAL_TABLET | Freq: Two times a day (BID) | ORAL | 1 refills | Status: DC

## 2017-07-07 MED ORDER — PANTOPRAZOLE SODIUM 40 MG PO TBEC: 40.0000 mg | DELAYED_RELEASE_TABLET | Freq: Every day | ORAL | 1 refills | Status: DC

## 2017-07-07 NOTE — Progress Notes (Signed)
Name: Jose Castro   MRN: [id number]    DOB: [birth year]   Date:07/07/2017       Progress Note  Subjective  Chief Complaint  Chief Complaint  Patient presents with  . Medication Refill  . Hypertension    Denies any symptoms  . Gastroesophageal Reflux  . Back Pain  . Hyperlipidemia    HPI  Paresthesia legs: he states that about 18 months ago symptoms of paresthesia , we checked B12 but it was slightly low, advised to start medication but he did not get supplements, symptoms are stable, seen Dr. Payton Mccallum recently and has PVD, advised to walk more.    Dyslipidemia: his last labs done 10/2016 , LDL was 137, HDL very low at 35, triglycerides up, he is fasting today and we will recheck it today. He refuses to take statin therapy at this time. Not taking aspirin. He has bilateral carotid stenosis, also aorta atherosclerosis and PVD, with a history of splenic infarct and coagulopathy. He denies chest pain, symptoms of TIA or stroke.   Hyperglycemia: his last hgbA1C was 5.9 % back in Dec 2017 . He denies polyphagia, polydipsia or polyuria.   GERD and remote history of upper GI bleed: he states that he does not have heartburn or indigestion as long as he takes medications daily .   HTN: he takes medications and denies chest pain or palpitation  Bradycardia: he has a long history of bradycardia, EKG back in 2014 showed heart rate of 50, and 1st degree AV block  Spinal stenosis: he states he has sensation that left foot is heavy at times, he has intermittent low back pain, no bowel or bladder incontinence noticed  Patient Active Problem List   Diagnosis Date Noted  . Lumbar spondylosis 03/10/2017  . Elevated uric acid in blood 11/20/2016  . BPH (benign prostatic hyperplasia) 05/29/2016  . Peripheral vascular disease of lower extremity ([place]) 03/03/2016  . Gallstone 12/31/2015  . 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 Atherosclerosis of aorta ([place]) 11/22/2015   . Carotid artery narrowing 11/22/2015  . Diverticulosis of colon 11/22/2015  . Decreased creatinine clearance 11/22/2015  . Arthritis, degenerative 11/22/2015  . Lactose intolerance 11/22/2015  . Basal cell carcinoma 11/22/2015  . Essential hypertension 06/21/2015  . Hyperlipemia 06/21/2015  . Gastroesophageal reflux disease with esophagitis 06/21/2015  . Calculus of kidney 11/18/2013    Past Surgical History:  Procedure Laterality Date  . APPENDECTOMY    . KIDNEY STONE SURGERY    . KNEE SURGERY    . SPINE SURGERY      Family History  Problem Relation Age of Onset  . Cancer Brother        bladder cancer with mets  . Heart disease Mother   . Aortic aneurysm Mother   . Heart attack Maternal Aunt   . Heart attack Maternal Uncle     Social History   Social History  . Marital status: Widowed    Spouse name: N/A  . Number of children: N/A  . Years of education: N/A   Occupational History  . Not on file.   Social History Main Topics  . Smoking status: Former Smoker    Years: 16.00    Types: Cigarettes    Start date: 10/21/1944    Quit date: 10/21/1960  . Smokeless tobacco: Never Used  . Alcohol use 0.0 oz/week     Comment: once a week when he goes out to dinner-socially  . Drug use:  No  . Sexual activity: Yes    Partners: Female   Other Topics Concern  . Not on file   Social History Narrative  . No narrative on file     Current Outpatient Prescriptions:  .  brimonidine (ALPHAGAN) 0.2 % ophthalmic solution, Place 1 drop into both eyes 2 (two) times daily. , Disp: , Rfl:  .  Cholecalciferol (VITAMIN D3) 2000 units TABS, Take 1 tablet by mouth daily., Disp: , Rfl:  .  CIALIS 5 MG tablet, Take 5 mg by mouth daily. , Disp: , Rfl:  .  finasteride (PROSCAR) 5 MG tablet, Take 5 mg by mouth daily. , Disp: , Rfl:  .  lisinopril (PRINIVIL,ZESTRIL) 20 MG tablet, Take 1 tablet (20 mg total) by mouth 2 (two) times daily., Disp: 180 tablet, Rfl: 1 .  Omega-3 Fatty  Acids (FISH OIL) 1000 MG CAPS, Take 1 capsule by mouth daily., Disp: , Rfl:  .  pantoprazole (PROTONIX) 40 MG tablet, Take 1 tablet (40 mg total) by mouth daily., Disp: 90 tablet, Rfl: 1  Allergies  Allergen Reactions  . Penicillins Itching and Swelling     ROS  Constitutional: Negative for fever or weight change.  Respiratory: Negative for cough and shortness of breath.   Cardiovascular: Negative for chest pain or palpitations.  Gastrointestinal: Negative for abdominal pain, no bowel changes.  Musculoskeletal: Negative for gait problem or joint swelling.  Skin: Negative for rash.  Neurological: Negative for dizziness or headache.  No other specific complaints in a complete review of systems (except as listed in HPI above).  Objective  Vitals:   07/07/17 0859  BP: 122/74  Pulse: (!) 54  Resp: 16  Temp: 97.8 F (36.6 C)  TempSrc: Oral  SpO2: 95%  Weight: 202 lb 6.4 oz (91.8 kg)  Height: 5\' 10"  (1.778 m)    Body mass index is 29.04 kg/m.  Physical Exam  Constitutional: Patient appears well-developed and well-nourished. Obese  No distress.  HEENT: head atraumatic, normocephalic, pupils equal and reactive to light,neck supple, throat within normal limits Cardiovascular: Normal rate, regular rhythm and normal heart sounds.  No murmur heard. 1 plus  BLE edema. Pulmonary/Chest: Effort normal and breath sounds normal. No respiratory distress. Abdominal: Soft.  There is no tenderness. Psychiatric: Patient has a normal mood and affect. behavior is normal. Judgment and thought content normal.    PHQ2/9: Depression screen [facility] 2/9 07/07/2017 11/18/2016 09/23/2016 09/16/2016 09/16/2016  Decreased Interest 0 0 0 0 0  Down, Depressed, Hopeless 0 0 0 0 0  PHQ - 2 Score 0 0 0 0 0     Fall Risk: Fall Risk  07/07/2017 11/18/2016 09/23/2016 09/16/2016 09/16/2016  Falls in the past year? No No Yes Yes No  Number falls in past yr: - - 1 1 -  Injury with Fall? - - No No -  Follow up  - - Falls evaluation completed - -     Functional Status Survey: Is the patient deaf or have difficulty hearing?: Yes Does the patient have difficulty seeing, even when wearing glasses/contacts?: No Does the patient have difficulty concentrating, remembering, or making decisions?: No Does the patient have difficulty walking or climbing stairs?: No Does the patient have difficulty dressing or bathing?: No Does the patient have difficulty doing errands alone such as visiting a doctor's office or shopping?: No   Assessment & Plan  1. Atherosclerosis of aorta (HCC)  Discussed statin therapy and aspirin 81 mg, but he wants to repeat labs.  2. Peripheral vascular disease of lower extremity (HCC)  Seeing Dr. Payton Mccallum, advised to walk more  3. Gastroesophageal reflux disease with esophagitis  - pantoprazole (PROTONIX) 40 MG tablet; Take 1 tablet (40 mg total) by mouth daily.  Dispense: 90 tablet; Refill: 1  4. Mixed hyperlipidemia  - Lipid panel  5. Essential hypertension  At goal  - lisinopril (PRINIVIL,ZESTRIL) 20 MG tablet; Take 1 tablet (20 mg total) by mouth 2 (two) times daily.  Dispense: 180 tablet; Refill: 1 - COMPLETE METABOLIC PANEL WITH GFR - CBC with Differential/Platelet  6. Family history of upper GI bleeding  - pantoprazole (PROTONIX) 40 MG tablet; Take 1 tablet (40 mg total) by mouth daily.  Dispense: 90 tablet; Refill: 1  7. Bradycardia with 51-60 beats per minute  stable  8. Pre-diabetes  We will recheck labs - Hemoglobin A1c - Insulin, fasting  9. Decreased GFR  - COMPLETE METABOLIC PANEL WITH GFR  10. Paresthesia of both feet  - Methylmalonic Acid, Serum  11. Vitamin D deficiency  - VITAMIN D 25 Hydroxy (Vit-D Deficiency, Fractures)  12. Coagulopathy ([place])  Seeing Hematologist  History of spleen infarct  13. Atherosclerosis of both carotid arteries  Stable at about 40 % blockage

## 2017-07-08 LAB — HEMOGLOBIN A1C
HEMOGLOBIN A1C: 5.6 % (ref <5.7)
MEAN PLASMA GLUCOSE: 114 mg/dL

## 2017-07-08 LAB — VITAMIN D 25 HYDROXY (VIT D DEFICIENCY, FRACTURES): VIT D 25 HYDROXY: 35 ng/mL (ref 30–100)

## 2017-07-09 LAB — COMPLETE METABOLIC PANEL WITH GFR
ALBUMIN: 3.9 g/dL (ref 3.6–5.1)
ALK PHOS: 62 U/L (ref 40–115)
ALT: 8 U/L — AB (ref 9–46)
AST: 11 U/L (ref 10–35)
BUN: 20 mg/dL (ref 7–25)
CALCIUM: 9.4 mg/dL (ref 8.6–10.3)
CHLORIDE: 108 mmol/L (ref 98–110)
CO2: 22 mmol/L (ref 20–32)
CREATININE: 1.22 mg/dL — AB (ref 0.70–1.11)
GFR, EST AFRICAN AMERICAN: 61 mL/min (ref >=60)
GFR, Est Non African American: 53 mL/min — ABNORMAL LOW (ref >=60)
Glucose, Bld: 94 mg/dL (ref 65–99)
Potassium: 5.1 mmol/L (ref 3.5–5.3)
Sodium: 141 mmol/L (ref 135–146)
Total Bilirubin: 0.4 mg/dL (ref 0.2–1.2)
Total Protein: 6 g/dL — ABNORMAL LOW (ref 6.1–8.1)

## 2017-07-09 LAB — LIPID PANEL
Cholesterol: 221 mg/dL — ABNORMAL HIGH (ref <200)
HDL: 37 mg/dL — AB (ref >40)
LDL Cholesterol: 152 mg/dL — ABNORMAL HIGH (ref <100)
Total CHOL/HDL Ratio: 6 Ratio — ABNORMAL HIGH (ref <5.0)
Triglycerides: 160 mg/dL — ABNORMAL HIGH (ref <150)
VLDL: 32 mg/dL — ABNORMAL HIGH (ref <30)

## 2017-07-09 LAB — INSULIN, FASTING: INSULIN FASTING, SERUM: 6.8 u[IU]/mL (ref 2.0–19.6)

## 2017-07-10 LAB — METHYLMALONIC ACID, SERUM: Methylmalonic Acid, Quant: 221 nmol/L (ref 87–318)

## 2017-07-20 ENCOUNTER — Telehealth: Payer: Self-pay | Admitting: Family Medicine

## 2017-07-20 NOTE — Telephone Encounter (Signed)
Pt requesting return call. Checking status on lab results

## 2017-07-21 NOTE — Telephone Encounter (Signed)
Spoke to pt

## 2017-09-02 ENCOUNTER — Ambulatory Visit (INDEPENDENT_AMBULATORY_CARE_PROVIDER_SITE_OTHER): Payer: Medicare Other | Admitting: Family Medicine

## 2017-09-02 ENCOUNTER — Encounter: Payer: Self-pay | Admitting: Family Medicine

## 2017-09-02 VITALS — BP 120/50 | HR 62 | Temp 98.8°F | Resp 16 | Wt 199.2 lb

## 2017-09-02 DIAGNOSIS — R509 Fever, unspecified: Secondary | ICD-10-CM | POA: Diagnosis not present

## 2017-09-02 DIAGNOSIS — J0100 Acute maxillary sinusitis, unspecified: Secondary | ICD-10-CM | POA: Diagnosis not present

## 2017-09-02 DIAGNOSIS — I959 Hypotension, unspecified: Secondary | ICD-10-CM

## 2017-09-02 DIAGNOSIS — I10 Essential (primary) hypertension: Secondary | ICD-10-CM

## 2017-09-02 DIAGNOSIS — R05 Cough: Secondary | ICD-10-CM

## 2017-09-02 DIAGNOSIS — R059 Cough, unspecified: Secondary | ICD-10-CM

## 2017-09-02 DIAGNOSIS — Z23 Encounter for immunization: Secondary | ICD-10-CM

## 2017-09-02 MED ORDER — LEVOFLOXACIN 500 MG PO TABS: 500.0000 mg | ORAL_TABLET | Freq: Every day | ORAL | 0 refills | Status: DC

## 2017-09-02 MED ORDER — LISINOPRIL 10 MG PO TABS: 10.0000 mg | ORAL_TABLET | Freq: Every day | ORAL | 0 refills | Status: DC

## 2017-09-02 NOTE — Progress Notes (Signed)
Name: Jose Castro   MRN: [id number]    DOB: 23-Dec-1925   Date:09/02/2017       Progress Note  Subjective  Chief Complaint  Chief Complaint  Patient presents with  . URI    HPI  URI: he has been sick for the past 5 days, tmax 100.5 when he checked. He states initially body aches and chills. His appetite is normal. His right cheek is swollen, right eye is red with some drainage. He has also noticed a cough mostly dry but also has sputum production at times. He denies blurred vision, except when the mucus is in front of his eye. He denies eye pain. No change in bowel movements  HTN: today bp is low, no tachycardia, he seems to be feeling well, no dizziness, only taking lisinopril 20 mg in am's we will decrease to 10 mg for now and recheck next week   Patient Active Problem List   Diagnosis Date Noted  . Coagulopathy ([place]) 07/07/2017  . Lumbar spondylosis 03/10/2017  . Elevated uric acid in blood 11/20/2016  . BPH (benign prostatic hyperplasia) 05/29/2016  . Peripheral vascular disease of lower extremity ([place]) 03/03/2016  . Gallstone 12/31/2015  . 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 Atherosclerosis of aorta ([place]) 11/22/2015  . Carotid artery narrowing 11/22/2015  . Diverticulosis of colon 11/22/2015  . Decreased creatinine clearance 11/22/2015  . Arthritis, degenerative 11/22/2015  . Lactose intolerance 11/22/2015  . Basal cell carcinoma 11/22/2015  . Essential hypertension 06/21/2015  . Hyperlipemia 06/21/2015  . Gastroesophageal reflux disease with esophagitis 06/21/2015  . Calculus of kidney 11/18/2013    Past Surgical History:  Procedure Laterality Date  . APPENDECTOMY    . KIDNEY STONE SURGERY    . KNEE SURGERY    . SPINE SURGERY      Family History  Problem Relation Age of Onset  . Cancer Brother        bladder cancer with mets  . Heart disease Mother   . Aortic aneurysm Mother   . Heart attack Maternal Aunt   . Heart attack  Maternal Uncle     Social History   Social History  . Marital status: Widowed    Spouse name: N/A  . Number of children: N/A  . Years of education: N/A   Occupational History  . Not on file.   Social History Main Topics  . Smoking status: Former Smoker    Years: 16.00    Types: Cigarettes    Start date: 10/21/1944    Quit date: 10/21/1960  . Smokeless tobacco: Never Used  . Alcohol use 0.0 oz/week     Comment: once a week when he goes out to dinner-socially  . Drug use: No  . Sexual activity: Yes    Partners: Female   Other Topics Concern  . Not on file   Social History Narrative  . No narrative on file     Current Outpatient Prescriptions:  .  brimonidine (ALPHAGAN) 0.2 % ophthalmic solution, Place 1 drop into both eyes 2 (two) times daily. , Disp: , Rfl:  .  Cholecalciferol (VITAMIN D3) 2000 units TABS, Take 1 tablet by mouth daily., Disp: , Rfl:  .  CIALIS 5 MG tablet, Take 5 mg by mouth daily. , Disp: , Rfl:  .  finasteride (PROSCAR) 5 MG tablet, Take 5 mg by mouth daily. , Disp: , Rfl:  .  lisinopril (PRINIVIL,ZESTRIL) 10 MG tablet, Take 1 tablet (10 mg  total) by mouth daily., Disp: 30 tablet, Rfl: 0 .  Omega-3 Fatty Acids (FISH OIL) 1000 MG CAPS, Take 1 capsule by mouth daily., Disp: , Rfl:  .  pantoprazole (PROTONIX) 40 MG tablet, Take 1 tablet (40 mg total) by mouth daily., Disp: 90 tablet, Rfl: 1 .  levofloxacin (LEVAQUIN) 500 MG tablet, Take 1 tablet (500 mg total) by mouth daily., Disp: 7 tablet, Rfl: 0  Allergies  Allergen Reactions  . Penicillins Itching and Swelling     ROS  Constitutional: Negative for fever or weight change.  Respiratory: Positive for cough but no  shortness of breath.   Cardiovascular: Negative for chest pain or palpitations.  Gastrointestinal: Negative for abdominal pain, no bowel changes.  Musculoskeletal: Negative for gait problem or joint swelling.  Skin: Negative for rash.  Neurological: Negative for dizziness or  headache.  No other specific complaints in a complete review of systems (except as listed in HPI above).  Objective  Vitals:   09/02/17 1507  BP: (!) 120/50  Pulse: 62  Resp: 16  Temp: 98.8 F (37.1 C)  TempSrc: Oral  SpO2: 96%  Weight: 199 lb 3.2 oz (90.4 kg)    Body mass index is 28.58 kg/m.  Physical Exam  Constitutional: Patient appears well-developed and well-nourished. Obese  No distress.  HEENT: head atraumatic, normocephalic, conjunctiva is injected with some mucus drainage,  neck supple, throat within normal limits Cardiovascular: Normal rate, regular rhythm and normal heart sounds.  No murmur heard. No BLE edema. Pulmonary/Chest: Effort normal and breath sounds normal. No respiratory distress. Abdominal: Soft.  There is no tenderness. Psychiatric: Patient has a normal mood and affect. behavior is normal. Judgment and thought content normal.  Recent Results (from the past 2160 hour(s))  COMPLETE METABOLIC PANEL WITH GFR     Status: Abnormal   Collection Time: 07/07/17 10:09 AM  Result Value Ref Range   Sodium 141 135 - 146 mmol/L   Potassium 5.1 3.5 - 5.3 mmol/L   Chloride 108 98 - 110 mmol/L   CO2 22 20 - 32 mmol/L    Comment: ** Please note change in reference range(s). **      Glucose, Bld 94 65 - 99 mg/dL   BUN 20 7 - 25 mg/dL   Creat 1.22 (H) 0.70 - 1.11 mg/dL    Comment:   For patients > or = 81 years of age: The upper reference limit for Creatinine is approximately 13% higher for people identified as African-American.      Total Bilirubin 0.4 0.2 - 1.2 mg/dL   Alkaline Phosphatase 62 40 - 115 U/L   AST 11 10 - 35 U/L   ALT 8 (L) 9 - 46 U/L   Total Protein 6.0 (L) 6.1 - 8.1 g/dL   Albumin 3.9 3.6 - 5.1 g/dL   Calcium 9.4 8.6 - 10.3 mg/dL   GFR, Est African American 61 >=60 mL/min   GFR, Est Non African American 53 (L) >=60 mL/min  Hemoglobin A1c     Status: None   Collection Time: 07/07/17 10:09 AM  Result Value Ref Range   Hgb A1c MFr Bld  5.6 <5.7 %    Comment:   For the purpose of screening for the presence of diabetes:   <5.7%       Consistent with the absence of diabetes 5.7-6.4 %   Consistent with increased risk for diabetes (prediabetes) >=6.5 %     Consistent with diabetes   This assay result is consistent  with a decreased risk of diabetes.   Currently, no consensus exists regarding use of hemoglobin A1c for diagnosis of diabetes in children.   According to American Diabetes Association (ADA) guidelines, hemoglobin A1c <7.0% represents optimal control in non-pregnant diabetic patients. Different metrics may apply to specific patient populations. Standards of Medical Care in Diabetes (ADA).      Mean Plasma Glucose 114 mg/dL  Lipid panel     Status: Abnormal   Collection Time: 07/07/17 10:09 AM  Result Value Ref Range   Cholesterol 221 (H) <200 mg/dL   Triglycerides 160 (H) <150 mg/dL   HDL 37 (L) >40 mg/dL   Total CHOL/HDL Ratio 6.0 (H) <5.0 Ratio   VLDL 32 (H) <30 mg/dL   LDL Cholesterol 152 (H) <100 mg/dL  Insulin, fasting     Status: None   Collection Time: 07/07/17 10:09 AM  Result Value Ref Range   Insulin fasting, serum 6.8 2.0 - 19.6 uIU/mL    Comment:   This insulin assay shows strong cross-reactivity for some insulin analogs (lispro, aspart, and glargine) and much lower cross-reactivity with others (detemir, glulisine).   Stimulated Insulin reference intervals were established using the Siemens Immulite assay. These values are provided for general guidance only.   CBC with Differential/Platelet     Status: Abnormal   Collection Time: 07/07/17 10:09 AM  Result Value Ref Range   WBC 6.8 3.8 - 10.8 K/uL   RBC 5.00 4.20 - 5.80 MIL/uL   Hemoglobin 14.7 13.2 - 17.1 g/dL   HCT 44.1 38.5 - 50.0 %   MCV 88.2 80.0 - 100.0 fL   MCH 29.4 27.0 - 33.0 pg   MCHC 33.3 32.0 - 36.0 g/dL   RDW 14.2 11.0 - 15.0 %   Platelets 291 140 - 400 K/uL   MPV 10.6 7.5 - 12.5 fL   Neutro Abs 3,604 1,500 - 7,800  cells/uL   Lymphs Abs 1,904 850 - 3,900 cells/uL   Monocytes Absolute 952 (H) 200 - 950 cells/uL   Eosinophils Absolute 272 15 - 500 cells/uL   Basophils Absolute 68 0 - 200 cells/uL   Neutrophils Relative % 53 %   Lymphocytes Relative 28 %   Monocytes Relative 14 %   Eosinophils Relative 4 %   Basophils Relative 1 %   Smear Review Criteria for review not met   VITAMIN D 25 Hydroxy (Vit-D Deficiency, Fractures)     Status: None   Collection Time: 07/07/17 10:09 AM  Result Value Ref Range   Vit D, 25-Hydroxy 35 30 - 100 ng/mL    Comment: Vitamin D Status           25-OH Vitamin D        Deficiency                <20 ng/mL        Insufficiency         20 - 29 ng/mL        Optimal             > or = 30 ng/mL   For 25-OH Vitamin D testing on patients on D2-supplementation and patients for whom quantitation of D2 and D3 fractions is required, the QuestAssureD 25-OH VIT D, (D2,D3), LC/MS/MS is recommended: order code [id number] (patients > 2 yrs).   Methylmalonic Acid, Serum     Status: None   Collection Time: 07/07/17 10:09 AM  Result Value Ref Range   Methylmalonic Acid, Quant 221 87 -  318 nmol/L      PHQ2/9: Depression screen [facility] 2/9 07/07/2017 11/18/2016 09/23/2016 09/16/2016 09/16/2016  Decreased Interest 0 0 0 0 0  Down, Depressed, Hopeless 0 0 0 0 0  PHQ - 2 Score 0 0 0 0 0     Fall Risk: Fall Risk  07/07/2017 11/18/2016 09/23/2016 09/16/2016 09/16/2016  Falls in the past year? No No Yes Yes No  Number falls in past yr: - - 1 1 -  Injury with Fall? - - No No -  Follow up - - Falls evaluation completed - -     Assessment & Plan  1. Cough  - levofloxacin (LEVAQUIN) 500 MG tablet; Take 1 tablet (500 mg total) by mouth daily.  Dispense: 7 tablet; Refill: 0 He is here today with his daughter and discussed CXR and labs but they would like to hold off for now  2. Acute maxillary sinusitis, recurrence not specified  - levofloxacin (LEVAQUIN) 500 MG tablet; Take 1 tablet  (500 mg total) by mouth daily.  Dispense: 7 tablet; Refill: 0  3. Fever, unspecified fever cause  Under control now because he is taking Tylenol   4. Hypotension, unspecified hypotension type  BP is low today, we will change dose of medication  5. Essential hypertension  - lisinopril (PRINIVIL,ZESTRIL) 10 MG tablet; Take 1 tablet (10 mg total) by mouth daily.  Dispense: 30 tablet; Refill: 0  -Red flags and when to present for emergency care or RTC including fever >101.23F, chest pain, shortness of breath, new/worsening/un-resolving symptoms,  reviewed with patient at time of visit. Follow up and care instructions discussed and provided in AVS.

## 2017-09-09 ENCOUNTER — Ambulatory Visit (INDEPENDENT_AMBULATORY_CARE_PROVIDER_SITE_OTHER): Payer: Medicare Other | Admitting: Family Medicine

## 2017-09-09 ENCOUNTER — Encounter: Payer: Self-pay | Admitting: Family Medicine

## 2017-09-09 VITALS — BP 118/56 | HR 57 | Temp 98.3°F | Resp 16 | Ht 70.0 in | Wt 200.1 lb

## 2017-09-09 DIAGNOSIS — Z23 Encounter for immunization: Secondary | ICD-10-CM | POA: Diagnosis not present

## 2017-09-09 DIAGNOSIS — J40 Bronchitis, not specified as acute or chronic: Secondary | ICD-10-CM

## 2017-09-09 DIAGNOSIS — I10 Essential (primary) hypertension: Secondary | ICD-10-CM

## 2017-09-09 MED ORDER — FLUTICASONE FUROATE-VILANTEROL 100-25 MCG/INH IN AEPB: 1.0000 | INHALATION_SPRAY | Freq: Every day | RESPIRATORY_TRACT | 0 refills | Status: DC

## 2017-09-09 MED ORDER — BENZONATATE 100 MG PO CAPS: 100.0000 mg | ORAL_CAPSULE | Freq: Two times a day (BID) | ORAL | 0 refills | Status: DC | PRN

## 2017-09-09 NOTE — Progress Notes (Signed)
Name: Jose Castro   MRN: [id number]    DOB: 1926-06-11   Date:09/09/2017       Progress Note  Subjective  Chief Complaint  Chief Complaint  Patient presents with  . Cough    1 week F/U, coughing has improved. Still has some but states its a regular cough.  . Hypotension    Doing well with new medication change    HPI  Bronchitis: he finished Levaquin and still has a cough, but not as severe, no SOB or wheezing. Appetite is normal and denies any fever.   HTN: he is down to one 10 mg lisinopril daily and bp still to low end of normal, we will stop ace also explained proscar can drop bp, he also had bradycardia and not worried about that. Denies chest pain or palpitation   Patient Active Problem List   Diagnosis Date Noted  . Coagulopathy ([place]) 07/07/2017  . Lumbar spondylosis 03/10/2017  . Elevated uric acid in blood 11/20/2016  . BPH (benign prostatic hyperplasia) 05/29/2016  . Peripheral vascular disease of lower extremity ([place]) 03/03/2016  . Gallstone 12/31/2015  . 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 Atherosclerosis of aorta ([place]) 11/22/2015  . Carotid artery narrowing 11/22/2015  . Diverticulosis of colon 11/22/2015  . Decreased creatinine clearance 11/22/2015  . Arthritis, degenerative 11/22/2015  . Lactose intolerance 11/22/2015  . Basal cell carcinoma 11/22/2015  . Essential hypertension 06/21/2015  . Hyperlipemia 06/21/2015  . Gastroesophageal reflux disease with esophagitis 06/21/2015  . Calculus of kidney 11/18/2013    Past Surgical History:  Procedure Laterality Date  . APPENDECTOMY    . KIDNEY STONE SURGERY    . KNEE SURGERY    . SPINE SURGERY      Family History  Problem Relation Age of Onset  . Cancer Brother        bladder cancer with mets  . Heart disease Mother   . Aortic aneurysm Mother   . Heart attack Maternal Aunt   . Heart attack Maternal Uncle     Social History   Social History  . Marital status:  Widowed    Spouse name: N/A  . Number of children: N/A  . Years of education: N/A   Occupational History  . Not on file.   Social History Main Topics  . Smoking status: Former Smoker    Years: 16.00    Types: Cigarettes    Start date: 10/21/1944    Quit date: 10/21/1960  . Smokeless tobacco: Never Used  . Alcohol use 0.0 oz/week     Comment: once a week when he goes out to dinner-socially  . Drug use: No  . Sexual activity: Yes    Partners: Female   Other Topics Concern  . Not on file   Social History Narrative  . No narrative on file     Current Outpatient Prescriptions:  .  brimonidine (ALPHAGAN) 0.2 % ophthalmic solution, Place 1 drop into both eyes 2 (two) times daily. , Disp: , Rfl:  .  Cholecalciferol (VITAMIN D3) 2000 units TABS, Take 1 tablet by mouth daily., Disp: , Rfl:  .  CIALIS 5 MG tablet, Take 5 mg by mouth daily. , Disp: , Rfl:  .  finasteride (PROSCAR) 5 MG tablet, Take 5 mg by mouth daily. , Disp: , Rfl:  .  lisinopril (PRINIVIL,ZESTRIL) 10 MG tablet, Take 1 tablet (10 mg total) by mouth daily., Disp: 30 tablet, Rfl: 0 .  Omega-3 Fatty  Acids (FISH OIL) 1000 MG CAPS, Take 1 capsule by mouth daily., Disp: , Rfl:  .  pantoprazole (PROTONIX) 40 MG tablet, Take 1 tablet (40 mg total) by mouth daily., Disp: 90 tablet, Rfl: 1 .  benzonatate (TESSALON) 100 MG capsule, Take 1-2 capsules (100-200 mg total) by mouth 2 (two) times daily as needed., Disp: 40 capsule, Rfl: 0 .  fluticasone furoate-vilanterol (BREO ELLIPTA) 100-25 MCG/INH AEPB, Inhale 1 puff into the lungs daily., Disp: 14 each, Rfl: 0  Allergies  Allergen Reactions  . Penicillins Itching and Swelling     ROS  Ten systems reviewed and is negative except as mentioned in HPI   Objective  Vitals:   09/09/17 1257  BP: (!) 118/56  Pulse: (!) 57  Resp: 16  Temp: 98.3 F (36.8 C)  TempSrc: Oral  SpO2: 98%  Weight: 200 lb 1.6 oz (90.8 kg)  Height: 5' 10"  (1.778 m)    Body mass index is  28.71 kg/m.  Physical Exam  Constitutional: Patient appears well-developed and well-nourished. Overweight.  No distress.  HEENT: head atraumatic, normocephalic, pupils equal and reactive to light,  neck supple, throat within normal limits Cardiovascular: Normal rate, regular rhythm and normal heart sounds.  No murmur heard. No BLE edema. Pulmonary/Chest: Effort normal and breath sounds normal. No respiratory distress. Abdominal: Soft.  There is no tenderness. Psychiatric: Patient has a normal mood and affect. behavior is normal. Judgment and thought content normal.  Recent Results (from the past 2160 hour(s))  COMPLETE METABOLIC PANEL WITH GFR     Status: Abnormal   Collection Time: 07/07/17 10:09 AM  Result Value Ref Range   Sodium 141 135 - 146 mmol/L   Potassium 5.1 3.5 - 5.3 mmol/L   Chloride 108 98 - 110 mmol/L   CO2 22 20 - 32 mmol/L    Comment: ** Please note change in reference range(s). **      Glucose, Bld 94 65 - 99 mg/dL   BUN 20 7 - 25 mg/dL   Creat 1.22 (H) 0.70 - 1.11 mg/dL    Comment:   For patients > or = 81 years of age: The upper reference limit for Creatinine is approximately 13% higher for people identified as African-American.      Total Bilirubin 0.4 0.2 - 1.2 mg/dL   Alkaline Phosphatase 62 40 - 115 U/L   AST 11 10 - 35 U/L   ALT 8 (L) 9 - 46 U/L   Total Protein 6.0 (L) 6.1 - 8.1 g/dL   Albumin 3.9 3.6 - 5.1 g/dL   Calcium 9.4 8.6 - 10.3 mg/dL   GFR, Est African American 61 >=60 mL/min   GFR, Est Non African American 53 (L) >=60 mL/min  Hemoglobin A1c     Status: None   Collection Time: 07/07/17 10:09 AM  Result Value Ref Range   Hgb A1c MFr Bld 5.6 <5.7 %    Comment:   For the purpose of screening for the presence of diabetes:   <5.7%       Consistent with the absence of diabetes 5.7-6.4 %   Consistent with increased risk for diabetes (prediabetes) >=6.5 %     Consistent with diabetes   This assay result is consistent with a decreased risk  of diabetes.   Currently, no consensus exists regarding use of hemoglobin A1c for diagnosis of diabetes in children.   According to American Diabetes Association (ADA) guidelines, hemoglobin A1c <7.0% represents optimal control in non-pregnant diabetic patients. Different  metrics may apply to specific patient populations. Standards of Medical Care in Diabetes (ADA).      Mean Plasma Glucose 114 mg/dL  Lipid panel     Status: Abnormal   Collection Time: 07/07/17 10:09 AM  Result Value Ref Range   Cholesterol 221 (H) <200 mg/dL   Triglycerides 160 (H) <150 mg/dL   HDL 37 (L) >40 mg/dL   Total CHOL/HDL Ratio 6.0 (H) <5.0 Ratio   VLDL 32 (H) <30 mg/dL   LDL Cholesterol 152 (H) <100 mg/dL  Insulin, fasting     Status: None   Collection Time: 07/07/17 10:09 AM  Result Value Ref Range   Insulin fasting, serum 6.8 2.0 - 19.6 uIU/mL    Comment:   This insulin assay shows strong cross-reactivity for some insulin analogs (lispro, aspart, and glargine) and much lower cross-reactivity with others (detemir, glulisine).   Stimulated Insulin reference intervals were established using the Siemens Immulite assay. These values are provided for general guidance only.   CBC with Differential/Platelet     Status: Abnormal   Collection Time: 07/07/17 10:09 AM  Result Value Ref Range   WBC 6.8 3.8 - 10.8 K/uL   RBC 5.00 4.20 - 5.80 MIL/uL   Hemoglobin 14.7 13.2 - 17.1 g/dL   HCT 44.1 38.5 - 50.0 %   MCV 88.2 80.0 - 100.0 fL   MCH 29.4 27.0 - 33.0 pg   MCHC 33.3 32.0 - 36.0 g/dL   RDW 14.2 11.0 - 15.0 %   Platelets 291 140 - 400 K/uL   MPV 10.6 7.5 - 12.5 fL   Neutro Abs 3,604 1,500 - 7,800 cells/uL   Lymphs Abs 1,904 850 - 3,900 cells/uL   Monocytes Absolute 952 (H) 200 - 950 cells/uL   Eosinophils Absolute 272 15 - 500 cells/uL   Basophils Absolute 68 0 - 200 cells/uL   Neutrophils Relative % 53 %   Lymphocytes Relative 28 %   Monocytes Relative 14 %   Eosinophils Relative 4 %    Basophils Relative 1 %   Smear Review Criteria for review not met   VITAMIN D 25 Hydroxy (Vit-D Deficiency, Fractures)     Status: None   Collection Time: 07/07/17 10:09 AM  Result Value Ref Range   Vit D, 25-Hydroxy 35 30 - 100 ng/mL    Comment: Vitamin D Status           25-OH Vitamin D        Deficiency                <20 ng/mL        Insufficiency         20 - 29 ng/mL        Optimal             > or = 30 ng/mL   For 25-OH Vitamin D testing on patients on D2-supplementation and patients for whom quantitation of D2 and D3 fractions is required, the QuestAssureD 25-OH VIT D, (D2,D3), LC/MS/MS is recommended: order code [id number] (patients > 2 yrs).   Methylmalonic Acid, Serum     Status: None   Collection Time: 07/07/17 10:09 AM  Result Value Ref Range   Methylmalonic Acid, Quant 221 87 - 318 nmol/L     PHQ2/9: Depression screen [facility] 2/9 09/09/2017 07/07/2017 11/18/2016 09/23/2016 09/16/2016  Decreased Interest 0 0 0 0 0  Down, Depressed, Hopeless 0 0 0 0 0  PHQ - 2 Score 0 0 0 0 0  Fall Risk: Fall Risk  09/09/2017 07/07/2017 11/18/2016 09/23/2016 09/16/2016  Falls in the past year? No No No Yes Yes  Number falls in past yr: - - - 1 1  Injury with Fall? - - - No No  Follow up - - - Falls evaluation completed -     Functional Status Survey: Is the patient deaf or have difficulty hearing?: Yes Does the patient have difficulty seeing, even when wearing glasses/contacts?: No Does the patient have difficulty concentrating, remembering, or making decisions?: No Does the patient have difficulty walking or climbing stairs?: No Does the patient have difficulty dressing or bathing?: No Does the patient have difficulty doing errands alone such as visiting a doctor's office or shopping?: No    Assessment & Plan  1. Bronchitis  - benzonatate (TESSALON) 100 MG capsule; Take 1-2 capsules (100-200 mg total) by mouth 2 (two) times daily as needed.  Dispense: 40 capsule; Refill: 0 -  fluticasone furoate-vilanterol (BREO ELLIPTA) 100-25 MCG/INH AEPB; Inhale 1 puff into the lungs daily.  Dispense: 14 each; Refill: 0  2. Essential hypertension  Stop lisinopril   3. Needs flu shot  refused

## 2017-10-06 ENCOUNTER — Encounter: Payer: Self-pay | Admitting: Family Medicine

## 2017-10-06 ENCOUNTER — Ambulatory Visit (INDEPENDENT_AMBULATORY_CARE_PROVIDER_SITE_OTHER): Payer: Medicare Other | Admitting: Family Medicine

## 2017-10-06 VITALS — BP 110/80 | HR 44 | Resp 14 | Ht 70.0 in | Wt 201.1 lb

## 2017-10-06 DIAGNOSIS — E782 Mixed hyperlipidemia: Secondary | ICD-10-CM

## 2017-10-06 DIAGNOSIS — I700 Atherosclerosis of aorta: Secondary | ICD-10-CM

## 2017-10-06 DIAGNOSIS — Z0000 Encounter for general adult medical examination without abnormal findings: Secondary | ICD-10-CM

## 2017-10-06 DIAGNOSIS — I10 Essential (primary) hypertension: Secondary | ICD-10-CM

## 2017-10-06 MED ORDER — LISINOPRIL 10 MG PO TABS: 10.0000 mg | ORAL_TABLET | Freq: Every day | ORAL | 1 refills | Status: DC

## 2017-10-06 NOTE — Patient Instructions (Signed)
Preventive Care 81 Years and Older, Male Preventive care refers to lifestyle choices and visits with your health care provider that can promote health and wellness. What does preventive care include?  A yearly physical exam. This is also called an annual well check.  Dental exams once or twice a year.  Routine eye exams. Ask your health care provider how often you should have your eyes checked.  Personal lifestyle choices, including: ? Daily care of your teeth and gums. ? Regular physical activity. ? Eating a healthy diet. ? Avoiding tobacco and drug use. ? Limiting alcohol use. ? Practicing safe sex. ? Taking low doses of aspirin every day. ? Taking vitamin and mineral supplements as recommended by your health care provider. What happens during an annual well check? The services and screenings done by your health care provider during your annual well check will depend on your age, overall health, lifestyle risk factors, and family history of disease. Counseling Your health care provider may ask you questions about your:  Alcohol use.  Tobacco use.  Drug use.  Emotional well-being.  Home and relationship well-being.  Sexual activity.  Eating habits.  History of falls.  Memory and ability to understand (cognition).  Work and work environment.  Screening You may have the following tests or measurements:  Height, weight, and BMI.  Blood pressure.  Lipid and cholesterol levels. These may be checked every 5 years, or more frequently if you are over 50 years old.  Skin check.  Lung cancer screening. You may have this screening every year starting at age 55 if you have a 30-pack-year history of smoking and currently smoke or have quit within the past 15 years.  Fecal occult blood test (FOBT) of the stool. You may have this test every year starting at age 50.  Flexible sigmoidoscopy or colonoscopy. You may have a sigmoidoscopy every 5 years or a colonoscopy every 10  years starting at age 50.  Prostate cancer screening. Recommendations will vary depending on your family history and other risks.  Hepatitis C blood test.  Hepatitis B blood test.  Sexually transmitted disease (STD) testing.  Diabetes screening. This is done by checking your blood sugar (glucose) after you have not eaten for a while (fasting). You may have this done every 1-3 years.  Abdominal aortic aneurysm (AAA) screening. You may need this if you are a current or former smoker.  Osteoporosis. You may be screened starting at age 70 if you are at high risk.  Talk with your health care provider about your test results, treatment options, and if necessary, the need for more tests. Vaccines Your health care provider may recommend certain vaccines, such as:  Influenza vaccine. This is recommended every year.  Tetanus, diphtheria, and acellular pertussis (Tdap, Td) vaccine. You may need a Td booster every 10 years.  Varicella vaccine. You may need this if you have not been vaccinated.  Zoster vaccine. You may need this after age 60.  Measles, mumps, and rubella (MMR) vaccine. You may need at least one dose of MMR if you were born in 1957 or later. You may also need a second dose.  Pneumococcal 13-valent conjugate (PCV13) vaccine. One dose is recommended after age 65.  Pneumococcal polysaccharide (PPSV23) vaccine. One dose is recommended after age 65.  Meningococcal vaccine. You may need this if you have certain conditions.  Hepatitis A vaccine. You may need this if you have certain conditions or if you travel or work in places where you   may be exposed to hepatitis A.  Hepatitis B vaccine. You may need this if you have certain conditions or if you travel or work in places where you may be exposed to hepatitis B.  Haemophilus influenzae type b (Hib) vaccine. You may need this if you have certain risk factors.  Talk to your health care provider about which screenings and vaccines  you need and how often you need them. This information is not intended to replace advice given to you by your health care provider. Make sure you discuss any questions you have with your health care provider. Document Released: 12/14/2015 Document Revised: 08/06/2016 Document Reviewed: 09/18/2015 Elsevier Interactive Patient Education  2017 [employer].

## 2017-10-06 NOTE — Progress Notes (Signed)
Patient: Jose Castro, Male    DOB: January 12, 1926, 83 y.o.   MRN: [id number]  Visit Date: 10/06/2017  Today's Provider: Loistine Chance, MD   Chief Complaint  Patient presents with  . Medicare Wellness    Subjective:   Jose Castro is a 83 y.o. male who presents today for his Subsequent Annual Wellness Visit.  Patient/Caregiver input:    HPI  HTN: taking only 10 mg of lisinopril, denies chest pain or palpitation, no dizziness  Hyperlipidemia: refuses to take statin therapy   History of splenic vein thrombosis and atherosclerosis: refuses statin therapy, he finished course of Eliquis and is not on aspirin. No problems at this time, no pain. No symptoms of stroke  GERD: not taking PPI, doing well, no heartburn or regurgitation at this time, he said very seldom taking Tums now  Review of Systems Constitutional: Negative for fever or weight change.  Respiratory: Negative for cough and shortness of breath.   Cardiovascular: Negative for chest pain or palpitations.  Gastrointestinal: Negative for abdominal pain, no bowel changes.  Musculoskeletal: positive for gait problem and intermittent right knee  joint swelling.  Skin: Negative for rash.  Neurological: Negative for dizziness or headache.  No other specific complaints in a complete review of systems (except as listed in HPI above).  Past Medical History:  Diagnosis Date  . BPH (benign prostatic hyperplasia)   . Chronic kidney disease    had a kidney stone which per family was a cyst that was removed  . GERD (gastroesophageal reflux disease)   . Hyperlipidemia   . Hypertension   . Splenic vein thrombosis   . Thrombosis 12/2015   mural  area and no notation of heart attack    Past Surgical History:  Procedure Laterality Date  . APPENDECTOMY    . KIDNEY STONE SURGERY    . KNEE SURGERY    . SPINE SURGERY      Family History  Problem Relation Age of Onset  . Cancer Brother        bladder cancer with mets  . Heart  disease Mother   . Aortic aneurysm Mother   . Heart attack Maternal Aunt   . Heart attack Maternal Uncle     Social History   Socioeconomic History  . Marital status: Widowed    Spouse name: Not on file  . Number of children: 6  . Years of education: Not on file  . Highest education level: Some college, no degree  Social Needs  . Financial resource strain: Not hard at all  . Food insecurity - worry: Never true  . Food insecurity - inability: Never true  . Transportation needs - medical: No  . Transportation needs - non-medical: No  Occupational History  . 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 Smoking status: Former Smoker    Years: 16.00    Types: Cigarettes    Start date: 10/21/1944    Last attempt to quit: 10/21/1960    Years since quitting: 56.9  . Smokeless tobacco: Never Used  Substance and Sexual Activity  . Alcohol use: Yes    Alcohol/week: 1.8 oz    Types: 3 Standard drinks or equivalent per week    Comment: once a week when he goes out to dinner-socially  . Drug use: No  . Sexual activity: Yes    Partners: Female  Other Topics Concern  . Not on file  Social History Narrative   Widow, has a good networks of friends and family     Outpatient Encounter Medications as of 10/06/2017  Medication Sig  . brimonidine (ALPHAGAN) 0.2 % ophthalmic solution Place 1 drop into both eyes 2 (two) times daily.   . Cholecalciferol (VITAMIN D3) 2000 units TABS Take 1 tablet by mouth daily.  Marland Kitchen CIALIS 5 MG tablet Take 5 mg by mouth daily.   . finasteride (PROSCAR) 5 MG tablet Take 5 mg by mouth daily.   Marland Kitchen lisinopril (PRINIVIL,ZESTRIL) 10 MG tablet Take 1 tablet (10 mg total) by mouth daily.  . Omega-3 Fatty Acids (FISH OIL) 1000 MG CAPS Take 1 capsule by mouth daily.  . pantoprazole (PROTONIX) 40 MG tablet Take 1 tablet (40 mg total) by mouth daily.  . [DISCONTINUED] fluticasone furoate-vilanterol (BREO ELLIPTA) 100-25 MCG/INH AEPB  Inhale 1 puff into the lungs daily.  . [DISCONTINUED] benzonatate (TESSALON) 100 MG capsule Take 1-2 capsules (100-200 mg total) by mouth 2 (two) times daily as needed. (Patient not taking: Reported on 10/06/2017)   No facility-administered encounter medications on file as of 10/06/2017.     Allergies  Allergen Reactions  . Penicillins Itching and Swelling  . Gabapentin Hives    GI upset.    Care Team Updated in EHR: Yes  Last Vision Exam: past Spring. Wears corrective lenses: Yes Last Dental Exam: he has plates Last Hearing Exam: Wears Hearing Aids: Not interested because of cost  Functional Ability / Safety Screening 1.  Was the timed Get Up and Go test longer than 30 seconds?  yes 2.  Does the patient need help with the phone, transportation, shopping,      preparing meals, housework, laundry, medications, or managing money?  yes 3.  Does the patient's home have:  loose throw rugs in the hallway?   yes      Grab bars in the bathroom? no      Handrails on the stairs?   yes      Poor lighting?   no 4.  Has the patient noticed any hearing difficulties?   yes   Advanced Directives: A voluntary discussion about advance care planning including the explanation and discussion of advance directives was discussed with the patient. Explanation about the health care proxy and living will was reviewed.  During this discussion, the patient was able to identify a health care proxy as Karriem Muench, [place] He refuses a living will  Does patient have a HCPOA?    yes If yes, name and contact information:  Does patient have a living will or MOST form?  yes  Cancer Screenings:  Lung: Low Dose CT Chest recommended if Age 72-80 years, 30 pack-year currently smoking OR have quit w/in 15years. Patient does not qualify.  Lifestyle risk factor issued reviewed: Diet, exercise, weight management, advised patient smoking is not healthy, nutrition/diet.   Prostate: sees Dr. Yves Dill Colon: not  interested    Objective:   Vitals: BP 110/80 (BP Location: Right Arm, Patient Position: Sitting, Cuff Size: Normal)   Pulse (!) 44   Resp 14   Ht 5\' 10"  (1.778 m)   Wt 201 lb 1.6 oz (91.2 kg)   SpO2 98%   BMI 28.85 kg/m  Body mass index is 28.85 kg/m.  Lab Results  Component Value Date   CHOL 221 (H) 07/07/2017   CHOL 220 (H) 11/19/2016   CHOL 158 10/22/2015   Lab Results  Component Value Date   HDL 37 (L)  07/07/2017   HDL 35 (L) 11/19/2016   HDL 37 (L) 10/22/2015   Lab Results  Component Value Date   LDLCALC 152 (H) 07/07/2017   LDLCALC 137 (H) 11/19/2016   LDLCALC 87 10/22/2015   Lab Results  Component Value Date   TRIG 160 (H) 07/07/2017   TRIG 239 (H) 11/19/2016   TRIG 170 (H) 10/22/2015   Lab Results  Component Value Date   CHOLHDL 6.0 (H) 07/07/2017   CHOLHDL 4.3 10/22/2015   CHOLHDL 6.7 (H) 06/21/2015   No results found for: LDLDIRECT  No exam data present  Physical Exam Constitutional: Patient appears well-developed and well-nourished. Obese  No distress.  HEENT: head atraumatic, normocephalic, pupils equal and reactive to light,neck supple, throat within normal limits Cardiovascular: Normal rate, regular rhythm and normal heart sounds.  No murmur heard. No BLE edema. Pulmonary/Chest: Effort normal and breath sounds normal. No respiratory distress. Abdominal: Soft.  There is no tenderness. Genitalia: not done sees Dr. Yves Dill Psychiatric: Patient has a normal mood and affect. behavior is normal. Judgment and thought content normal.   Cognitive Testing - 6-CIT  Correct? Score   What year is it? yes 0 Yes = 0    No = 4  What month is it? yes 0 Yes = 0    No = 3  Remember:     Pia Mau, [place], [place]     What time is it? yes 0 Yes = 0    No = 3  Count backwards from 20 to 1 yes 0 Correct = 0    1 error = 2   More than 1 error = 4  Say the months of the year in reverse. yes 0 Correct = 0    1 error = 2   More than 1 error = 4  What  address did I ask you to remember? yes 0 Correct = 0  1 error = 2    2 error = 4    3 error = 6    4 error = 8    All wrong = 10       TOTAL SCORE  0/28   Interpretation:  Normal  Normal (0-7) Abnormal (8-28)   Fall Risk: Fall Risk  10/06/2017 09/09/2017 07/07/2017 11/18/2016 09/23/2016  Falls in the past year? No No No No Yes  Number falls in past yr: - - - - 1  Injury with Fall? - - - - No  Follow up - - - - Falls evaluation completed    Depression Screen Depression screen [facility] 2/9 09/09/2017 07/07/2017 11/18/2016 09/23/2016 09/16/2016  Decreased Interest 0 0 0 0 0  Down, Depressed, Hopeless 0 0 0 0 0  PHQ - 2 Score 0 0 0 0 0    No results found for this or any previous visit (from the past 2160 hour(s)).   Assessment & Plan:    1. Medicare annual wellness visit, subsequent  Discussed importance of 150 minutes of physical activity weekly, eat two servings of fish weekly, eat one serving of tree nuts ( cashews, pistachios, pecans, almonds.Marland Kitchen) every other day, eat 6 servings of fruit/vegetables daily and drink plenty of water and avoid sweet beverages.   2. Essential hypertension  At goal , needs refills  3. Atherosclerosis of aorta (HCC)  On aspirin    4. Mixed hyperlipidemia  He refuses statin therapy   Discussed health benefits of physical activity, and encouraged him to engage in regular exercise  appropriate for his age and condition.   Immunization History  Administered Date(s) Administered  . Pneumococcal Conjugate-13 02/06/2014  . Pneumococcal Polysaccharide-23 06/08/2012  . Tdap 06/08/2012    Health Maintenance  Topic Date Due  . INFLUENZA VACCINE  11/27/2017 (Originally 07/01/2017)  . TETANUS/TDAP  06/08/2022  . PNA vac Low Risk Adult  Completed     No orders of the defined types were placed in this encounter.   Current Outpatient Medications:  .  brimonidine (ALPHAGAN) 0.2 % ophthalmic solution, Place 1 drop into both eyes 2 (two) times daily. , Disp:  , Rfl:  .  Cholecalciferol (VITAMIN D3) 2000 units TABS, Take 1 tablet by mouth daily., Disp: , Rfl:  .  CIALIS 5 MG tablet, Take 5 mg by mouth daily. , Disp: , Rfl:  .  finasteride (PROSCAR) 5 MG tablet, Take 5 mg by mouth daily. , Disp: , Rfl:  .  lisinopril (PRINIVIL,ZESTRIL) 10 MG tablet, Take 1 tablet (10 mg total) by mouth daily., Disp: 30 tablet, Rfl: 0 .  Omega-3 Fatty Acids (FISH OIL) 1000 MG CAPS, Take 1 capsule by mouth daily., Disp: , Rfl:  .  pantoprazole (PROTONIX) 40 MG tablet, Take 1 tablet (40 mg total) by mouth daily., Disp: 90 tablet, Rfl: 1 Medications Discontinued During This Encounter  Medication Reason  . benzonatate (TESSALON) 100 MG capsule Non-compliance  . fluticasone furoate-vilanterol (BREO ELLIPTA) 100-25 MCG/INH AEPB Non-compliance    I have personally reviewed and addressed the Medicare Annual Wellness health risk assessment questionnaire and have noted the following in the patient's chart:  A.         Medical and social history & family history B.         Use of alcohol, tobacco or illicit drugs  C.         Current medications and supplements D.         Functional and Cognitive ability and status E.         Nutritional status F.         Physical activity G.        Advance directives H.         List of other physicians I.          Hospitalizations, surgeries, and ER visits in previous 12 months J.         [place] such as hearing and vision if needed, cognitive and depression L.         Referrals and appointments -   In addition, I have reviewed and discussed with patient certain preventive protocols, quality metrics, and best practice recommendations. A written personalized care plan for preventive services as well as general preventive health recommendations were provided to patient.

## 2018-01-07 ENCOUNTER — Ambulatory Visit: Payer: Medicare Other | Admitting: Family Medicine

## 2018-01-07 ENCOUNTER — Encounter: Payer: Self-pay | Admitting: Family Medicine

## 2018-01-07 VITALS — BP 122/60 | HR 62 | Resp 16 | Ht 70.0 in | Wt 198.0 lb

## 2018-01-07 DIAGNOSIS — R7303 Prediabetes: Secondary | ICD-10-CM

## 2018-01-07 DIAGNOSIS — I739 Peripheral vascular disease, unspecified: Secondary | ICD-10-CM

## 2018-01-07 DIAGNOSIS — K2100 Gastro-esophageal reflux disease with esophagitis, without bleeding: Secondary | ICD-10-CM

## 2018-01-07 DIAGNOSIS — I10 Essential (primary) hypertension: Secondary | ICD-10-CM

## 2018-01-07 DIAGNOSIS — I700 Atherosclerosis of aorta: Secondary | ICD-10-CM | POA: Diagnosis not present

## 2018-01-07 DIAGNOSIS — E782 Mixed hyperlipidemia: Secondary | ICD-10-CM

## 2018-01-07 DIAGNOSIS — K5909 Other constipation: Secondary | ICD-10-CM

## 2018-01-07 DIAGNOSIS — D689 Coagulation defect, unspecified: Secondary | ICD-10-CM

## 2018-01-07 DIAGNOSIS — Z23 Encounter for immunization: Secondary | ICD-10-CM | POA: Diagnosis not present

## 2018-01-07 DIAGNOSIS — M1712 Unilateral primary osteoarthritis, left knee: Secondary | ICD-10-CM

## 2018-01-07 MED ORDER — LUBIPROSTONE 8 MCG PO CAPS: 8.0000 ug | ORAL_CAPSULE | Freq: Two times a day (BID) | ORAL | 0 refills | Status: DC

## 2018-01-07 MED ORDER — TRIAMCINOLONE ACETONIDE 40 MG/ML IJ SUSP
40.0000 mg | Freq: Once | INTRAMUSCULAR | Status: AC
  Administered 2018-01-07: 40 mg via INTRAMUSCULAR

## 2018-01-07 MED ORDER — LIDOCAINE HCL (PF) 1 % IJ SOLN
2.0000 mL | Freq: Once | INTRAMUSCULAR | Status: AC
  Administered 2018-01-07: 2 mL

## 2018-01-07 MED ORDER — PANTOPRAZOLE SODIUM 40 MG PO TBEC: 40.0000 mg | DELAYED_RELEASE_TABLET | Freq: Every day | ORAL | 1 refills | Status: DC

## 2018-01-07 NOTE — Progress Notes (Signed)
Name: Jose Castro   MRN: [id number]    DOB: 02-Apr-1926   Date:01/07/2018       Progress Note  Subjective  Chief Complaint  Chief Complaint  Patient presents with  . Hypertension    HPI   HTN: taking only 10 mg of lisinopril, denies chest pain or palpitation, no dizziness  Hyperlipidemia: refuses to take statin therapy. Reviewed last labs with patient.   History of splenic vein thrombosis and atherosclerosis: refuses statin therapy, he finished course of Eliquis and is not on aspirin. No problems at this time, no pain. No symptoms of stroke  GERD: not taking PPI, doing well, no heartburn or regurgitation at this time, he said very seldom taking Tums now  Constipation: he states he has been taking Miralax daily, but only taking half dose and has to strain to have a bowel movement, no blood in stools, no change in appetite.   OA of left knee: he sees Dr. Sabra Heck for injections, he states that he had an injection 6 months ago, he is going to an event this weekend and would like another injection today. He will have it done today.    Patient Active Problem List   Diagnosis Date Noted  . Coagulopathy ([place]) 07/07/2017  . Lumbar spondylosis 03/10/2017  . Elevated uric acid in blood 11/20/2016  . BPH (benign prostatic hyperplasia) 05/29/2016  . Peripheral vascular disease of lower extremity ([place]) 03/03/2016  . Gallstone 12/31/2015  . 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 Atherosclerosis of aorta ([place]) 11/22/2015  . Carotid artery narrowing 11/22/2015  . Diverticulosis of colon 11/22/2015  . Decreased creatinine clearance 11/22/2015  . Arthritis, degenerative 11/22/2015  . Lactose intolerance 11/22/2015  . Basal cell carcinoma 11/22/2015  . Essential hypertension 06/21/2015  . Hyperlipemia 06/21/2015  . Gastroesophageal reflux disease with esophagitis 06/21/2015  . Calculus of kidney 11/18/2013    Past Surgical History:  Procedure Laterality  Date  . APPENDECTOMY    . KIDNEY STONE SURGERY    . KNEE SURGERY    . SPINE SURGERY      Family History  Problem Relation Age of Onset  . Cancer Brother        bladder cancer with mets  . Heart disease Mother   . Aortic aneurysm Mother   . Heart attack Maternal Aunt   . Heart attack Maternal Uncle     Social History   Socioeconomic History  . Marital status: Widowed    Spouse name: Not on file  . Number of children: 6  . Years of education: Not on file  . Highest education level: Some college, no degree  Social Needs  . Financial resource strain: Not hard at all  . Food insecurity - worry: Never true  . Food insecurity - inability: Never true  . Transportation needs - medical: No  . Transportation needs - non-medical: No  Occupational History  . 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 Smoking status: Former Smoker    Years: 16.00    Types: Cigarettes    Start date: 10/21/1944    Last attempt to quit: 10/21/1960    Years since quitting: 57.2  . Smokeless tobacco: Never Used  Substance and Sexual Activity  . Alcohol use: Yes    Alcohol/week: 1.8 oz    Types: 3 Standard drinks or equivalent per week    Comment: once a week when  he goes out to dinner-socially  . Drug use: No  . Sexual activity: Yes    Partners: Female  Other Topics Concern  . Not on file  Social History Narrative   Widow, has a good networks of friends and family      Current Outpatient Medications:  .  brimonidine (ALPHAGAN) 0.2 % ophthalmic solution, Place 1 drop into both eyes 2 (two) times daily. , Disp: , Rfl:  .  Cholecalciferol (VITAMIN D3) 2000 units TABS, Take 1 tablet by mouth daily., Disp: , Rfl:  .  finasteride (PROSCAR) 5 MG tablet, Take 5 mg by mouth daily. , Disp: , Rfl:  .  lisinopril (PRINIVIL,ZESTRIL) 10 MG tablet, Take 1 tablet (10 mg total) daily by mouth., Disp: 90 tablet, Rfl: 1 .  Omega-3 Fatty Acids (FISH OIL) 1000 MG CAPS,  Take 1 capsule by mouth daily., Disp: , Rfl:  .  pantoprazole (PROTONIX) 40 MG tablet, Take 1 tablet (40 mg total) by mouth daily., Disp: 90 tablet, Rfl: 1 .  CIALIS 5 MG tablet, Take 5 mg by mouth daily. , Disp: , Rfl:  .  lubiprostone (AMITIZA) 8 MCG capsule, Take 1 capsule (8 mcg total) by mouth 2 (two) times daily with a meal., Disp: 180 capsule, Rfl: 0  Allergies  Allergen Reactions  . Penicillins Itching and Swelling  . Gabapentin Hives    GI upset.     ROS  Constitutional: Negative for fever or weight change.  Respiratory: Negative for cough and shortness of breath.   Cardiovascular: Negative for chest pain or palpitations.  Gastrointestinal: Negative for abdominal pain, no bowel changes.  Musculoskeletal: Positive  for gait problem but no  joint swelling.  Skin: Negative for rash.  Neurological: Negative for dizziness or headache.  No other specific complaints in a complete review of systems (except as listed in HPI above).  Objective  Vitals:   01/07/18 0804  BP: 122/60  Pulse: 62  Resp: 16  SpO2: 97%  Weight: 198 lb (89.8 kg)  Height: 5\' 10"  (1.778 m)    Body mass index is 28.41 kg/m.  Physical Exam  Constitutional: Patient appears well-developed and well-nourished. Overweight.No distress.  HEENT: head atraumatic, normocephalic, pupils equal and reactive to light,  neck supple, throat within normal limits Cardiovascular: Normal rate, regular rhythm and normal heart sounds.  No murmur heard. No BLE edema. Pulmonary/Chest: Effort normal and breath sounds normal. No respiratory distress. Abdominal: Soft.  There is no tenderness. Muscular Skeletal: brace on left knee, crepitus with extension of left knee, mild effusion , no redness or increase in warmth  Psychiatric: Patient has a normal mood and affect. behavior is normal. Judgment and thought content normal.  PHQ2/9: Depression screen [facility] 2/9 09/09/2017 07/07/2017 11/18/2016 09/23/2016 09/16/2016  Decreased  Interest 0 0 0 0 0  Down, Depressed, Hopeless 0 0 0 0 0  PHQ - 2 Score 0 0 0 0 0     Fall Risk: Fall Risk  01/07/2018 10/06/2017 09/09/2017 07/07/2017 11/18/2016  Falls in the past year? No No No No No  Number falls in past yr: - - - - -  Injury with Fall? - - - - -  Follow up - - - - -     Functional Status Survey: Is the patient deaf or have difficulty hearing?: No Does the patient have difficulty seeing, even when wearing glasses/contacts?: No Does the patient have difficulty concentrating, remembering, or making decisions?: No Does the patient have difficulty walking or climbing  stairs?: No Does the patient have difficulty dressing or bathing?: No Does the patient have difficulty doing errands alone such as visiting a doctor's office or shopping?: No    Assessment & Plan  1. Atherosclerosis of aorta ([place])  Continue medication   2. Essential hypertension  Taking medication and bp is at goal   3. Primary osteoarthritis of left knee  Consent form signed Localized left lateral aspect of left knee  Area prepped with alcohol  Injection with lidocaine 1% and Kenalog 40mg /1 ml on left knee joint space Patient tolerated procedure well No side effects  4. Coagulopathy (HCC)  Symptom free   5. Peripheral vascular disease of lower extremity ([place])   6. Mixed hyperlipidemia   7. Pre-diabetes   8. Constipation, chronic  Stop Miralax , we will try Amitiza - lubiprostone (AMITIZA) 8 MCG capsule; Take 1 capsule (8 mcg total) by mouth 2 (two) times daily with a meal.  Dispense: 180 capsule; Refill: 0  9. Gastroesophageal reflux disease with esophagitis  - pantoprazole (PROTONIX) 40 MG tablet; Take 1 tablet (40 mg total) by mouth daily.  Dispense: 90 tablet; Refill: 1  10. Needs flu shot  Refused

## 2018-02-03 ENCOUNTER — Ambulatory Visit: Payer: Medicare Other | Admitting: Family Medicine

## 2018-02-05 ENCOUNTER — Encounter: Payer: Self-pay | Admitting: Family Medicine

## 2018-02-05 ENCOUNTER — Ambulatory Visit: Payer: Medicare Other | Admitting: Family Medicine

## 2018-02-05 VITALS — BP 138/70 | HR 81 | Temp 99.0°F | Resp 18 | Ht 70.0 in | Wt 207.7 lb

## 2018-02-05 DIAGNOSIS — R300 Dysuria: Secondary | ICD-10-CM | POA: Diagnosis not present

## 2018-02-05 DIAGNOSIS — N3001 Acute cystitis with hematuria: Secondary | ICD-10-CM | POA: Diagnosis not present

## 2018-02-05 DIAGNOSIS — R350 Frequency of micturition: Secondary | ICD-10-CM | POA: Diagnosis not present

## 2018-02-05 LAB — POCT URINALYSIS DIPSTICK
Bilirubin, UA: NEGATIVE
Glucose, UA: 250
KETONES UA: NEGATIVE
NITRITE UA: POSITIVE
PH UA: 5 (ref 5.0–8.0)
Spec Grav, UA: 1.02 (ref 1.010–1.025)
UROBILINOGEN UA: NEGATIVE U/dL — AB

## 2018-02-05 MED ORDER — LEVOFLOXACIN 500 MG PO TABS: 500.0000 mg | ORAL_TABLET | Freq: Every day | ORAL | 0 refills | Status: DC

## 2018-02-05 NOTE — Progress Notes (Signed)
Name: Jose Castro   MRN: [id number]    DOB: 19-Apr-1926   Date:02/05/2018       Progress Note  Subjective  Chief Complaint  Chief Complaint  Patient presents with  . Urinary Tract Infection    since last night, burning, frequent    HPI  PT presents with concern for UTI - symptoms started last night around 8pm.  Endorses urinary frequency, dysuria,  Pertinent History: Sees Dr. Rogers Blocker (urology) at least once a year for PSA check - has BPH but has never had prostatitis or prostate cancer.  Denies fevers/chills, diaphoresis, back pain/abdominal pain/flank pain, decreased appetite, no frank hematuria, no testicular swelling or pain, no pain with BM's.  He has a history of kidney stones, but states this does not feel anything like that.  Patient Active Problem List   Diagnosis Date Noted  . Coagulopathy ([place]) 07/07/2017  . Lumbar spondylosis 03/10/2017  . Elevated uric acid in blood 11/20/2016  . BPH (benign prostatic hyperplasia) 05/29/2016  . Peripheral vascular disease of lower extremity ([place]) 03/03/2016  . Gallstone 12/31/2015  . 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 Atherosclerosis of aorta ([place]) 11/22/2015  . Carotid artery narrowing 11/22/2015  . Diverticulosis of colon 11/22/2015  . Decreased creatinine clearance 11/22/2015  . Arthritis, degenerative 11/22/2015  . Lactose intolerance 11/22/2015  . Basal cell carcinoma 11/22/2015  . Essential hypertension 06/21/2015  . Hyperlipemia 06/21/2015  . Gastroesophageal reflux disease with esophagitis 06/21/2015  . Calculus of kidney 11/18/2013    Social History   Tobacco Use  . Smoking status: Former Smoker    Years: 16.00    Types: Cigarettes    Start date: 10/21/1944    Last attempt to quit: 10/21/1960    Years since quitting: 57.3  . Smokeless tobacco: Never Used  Substance Use Topics  . Alcohol use: Yes    Alcohol/week: 1.8 oz    Types: 3 Standard drinks or equivalent per week     Comment: once a week when he goes out to dinner-socially     Current Outpatient Medications:  .  brimonidine (ALPHAGAN) 0.2 % ophthalmic solution, Place 1 drop into both eyes 2 (two) times daily. , Disp: , Rfl:  .  Cholecalciferol (VITAMIN D3) 2000 units TABS, Take 1 tablet by mouth daily., Disp: , Rfl:  .  CIALIS 5 MG tablet, Take 5 mg by mouth daily. , Disp: , Rfl:  .  finasteride (PROSCAR) 5 MG tablet, Take 5 mg by mouth daily. , Disp: , Rfl:  .  lisinopril (PRINIVIL,ZESTRIL) 10 MG tablet, Take 1 tablet (10 mg total) daily by mouth., Disp: 90 tablet, Rfl: 1 .  lubiprostone (AMITIZA) 8 MCG capsule, Take 1 capsule (8 mcg total) by mouth 2 (two) times daily with a meal., Disp: 180 capsule, Rfl: 0 .  Omega-3 Fatty Acids (FISH OIL) 1000 MG CAPS, Take 1 capsule by mouth daily., Disp: , Rfl:  .  pantoprazole (PROTONIX) 40 MG tablet, Take 1 tablet (40 mg total) by mouth daily., Disp: 90 tablet, Rfl: 1  Allergies  Allergen Reactions  . Penicillins Itching and Swelling  . Gabapentin Hives    GI upset.    ROS  Constitutional: Negative for fever or weight change.  Respiratory: Negative for cough and shortness of breath.   Cardiovascular: Negative for chest pain or palpitations.  Gastrointestinal: Negative for abdominal pain, no bowel changes. GU: See HPI  Musculoskeletal: Negative for gait problem or joint swelling.  Skin: Negative  for rash.  Neurological: Negative for dizziness or headache.  No other specific complaints in a complete review of systems (except as listed in HPI above).  Objective  Vitals:   02/05/18 0815  BP: 138/70  Pulse: 81  Resp: 18  Temp: 99 F (37.2 C)  TempSrc: Oral  SpO2: 94%  Weight: 207 lb 11.2 oz (94.2 kg)  Height: 5\' 10"  (1.778 m)   Body mass index is 29.8 kg/m.  Nursing Note and Vital Signs reviewed.  Physical Exam  Constitutional: Patient appears well-developed and well-nourished. Obese. No distress.  HEENT: head atraumatic,  normocephalic Cardiovascular: Normal rate, regular rhythm, S1/S2 present.  No murmur or rub heard. No BLE edema. Pulmonary/Chest: Effort normal and breath sounds clear. No respiratory distress or retractions. Abdominal: Soft and non-tender, bowel sounds present x4 quadrants.  No CVA Tenderness. Psychiatric: Patient has a normal mood and affect. behavior is normal. Judgment and thought content normal.  Results for orders placed or performed in visit on 02/05/18 (from the past 72 hour(s))  POCT urinalysis dipstick     Status: Abnormal   Collection Time: 02/05/18  8:25 AM  Result Value Ref Range   Color, UA gold    Clarity, UA cloudy    Glucose, UA 250    Bilirubin, UA negative    Ketones, UA negative    Spec Grav, UA 1.020 1.010 - 1.025   Blood, UA large    pH, UA 5.0 5.0 - 8.0   Protein, UA trace    Urobilinogen, UA negative (A) 0.2 or 1.0 E.U./dL   Nitrite, UA positive    Leukocytes, UA Large (3+) (A) Negative   Appearance cloudy    Odor none     Assessment & Plan  1. Acute cystitis with hematuria - levofloxacin (LEVAQUIN) 500 MG tablet; Take 1 tablet (500 mg total) by mouth daily.  Dispense: 7 tablet; Refill: 0 - Advised if symptoms worsen in any way he should present for emergency care.   - He has taken Levaquin in the past for similar illness and has done well on medication, discussed risk/benefit of fluoroquinolone use, and it is agreed that we will rx levaquin as first line treatment. 2. Frequent urination - POCT urinalysis dipstick - Urine Culture 3. Dysuria - Urine Culture   -Red flags and when to present for emergency care or RTC including fever >101.27F, chest pain, shortness of breath, new/worsening/un-resolving symptoms, abdominal/flank/back pain, frank hematuria, testicular or rectal pain, reviewed with patient at time of visit. Follow up and care instructions discussed and provided in AVS.

## 2018-02-05 NOTE — Patient Instructions (Addendum)
Urinary Tract Infection, Adult °A urinary tract infection (UTI) is an infection of any part of the urinary tract. The urinary tract includes the: °· Kidneys. °· Ureters. °· Bladder. °· Urethra. ° °These organs make, store, and get rid of pee (urine) in the body. °Follow these instructions at home: °· Take over-the-counter and prescription medicines only as told by your doctor. °· If you were prescribed an antibiotic medicine, take it as told by your doctor. Do not stop taking the antibiotic even if you start to feel better. °· Avoid the following drinks: °? Alcohol. °? Caffeine. °? Tea. °? Carbonated drinks. °· Drink enough fluid to keep your pee clear or pale yellow. °· Keep all follow-up visits as told by your doctor. This is important. °· Make sure to: °? Empty your bladder often and completely. Do not to hold pee for long periods of time. °? Empty your bladder before and after sex. °? Wipe from front to back after a bowel movement if you are male. Use each tissue one time when you wipe. °Contact a doctor if: °· You have back pain. °· You have a fever. °· You feel sick to your stomach (nauseous). °· You throw up (vomit). °· Your symptoms do not get better after 3 days. °· Your symptoms go away and then come back. °Get help right away if: °· You have very bad back pain. °· You have very bad lower belly (abdominal) pain. °· You are throwing up and cannot keep down any medicines or water. °This information is not intended to replace advice given to you by your health care provider. Make sure you discuss any questions you have with your health care provider. °Document Released: 05/05/2008 Document Revised: 04/24/2016 Document Reviewed: 10/08/2015 °Elsevier Interactive Patient Education © 2018 Elsevier Inc. ° °

## 2018-02-07 LAB — URINE CULTURE
MICRO NUMBER:: 90301299
SPECIMEN QUALITY: ADEQUATE

## 2018-02-26 ENCOUNTER — Ambulatory Visit: Payer: Medicare Other

## 2018-03-01 ENCOUNTER — Encounter: Payer: Self-pay | Admitting: Family Medicine

## 2018-03-01 ENCOUNTER — Ambulatory Visit: Payer: Medicare Other | Admitting: Family Medicine

## 2018-03-01 VITALS — BP 180/60 | HR 55 | Resp 16 | Ht 70.0 in | Wt 205.1 lb

## 2018-03-01 DIAGNOSIS — Z87442 Personal history of urinary calculi: Secondary | ICD-10-CM | POA: Diagnosis not present

## 2018-03-01 DIAGNOSIS — M545 Low back pain, unspecified: Secondary | ICD-10-CM

## 2018-03-01 DIAGNOSIS — R1031 Right lower quadrant pain: Secondary | ICD-10-CM

## 2018-03-01 DIAGNOSIS — I10 Essential (primary) hypertension: Secondary | ICD-10-CM

## 2018-03-01 DIAGNOSIS — N390 Urinary tract infection, site not specified: Secondary | ICD-10-CM

## 2018-03-01 NOTE — Progress Notes (Signed)
Name: Jose Castro   MRN: [id number]    DOB: 1926/01/02   Date:03/01/2018       Progress Note  Subjective  Chief Complaint  Chief Complaint  Patient presents with  . Back Pain    HPI  Acute low back pain: he has a history of recurrent UTI - last treated by Raelyn Ensign NP on 02/05/2018 with Levaquin. He states frequency and dysuria resolved, however one day after he finished antibiotic he developed acute right lower back pain. Pain was initially sharp, intense and radiated across his lower back. He went to Urgent Care 2 weeks ago and had urine checked again and was told to use Aleve or ibuprofen and heating pad. He states the heating pad and Ibuprofen seems to help. He has been afebrile, no nausea or vomiting. He states pain radiates to right lower quadrant at times. Appetite is normal. He has a history of kidney stones.   HTN: bp is elevated, he has been taking half a pill now, advised to go back to one pill daily and return in one week for recheck, stay off nsaid's    Patient Active Problem List   Diagnosis Date Noted  . Coagulopathy ([place]) 07/07/2017  . Lumbar spondylosis 03/10/2017  . Elevated uric acid in blood 11/20/2016  . BPH (benign prostatic hyperplasia) 05/29/2016  . Peripheral vascular disease of lower extremity ([place]) 03/03/2016  . Gallstone 12/31/2015  . 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 Atherosclerosis of aorta ([place]) 11/22/2015  . Carotid artery narrowing 11/22/2015  . Diverticulosis of colon 11/22/2015  . Decreased creatinine clearance 11/22/2015  . Arthritis, degenerative 11/22/2015  . Lactose intolerance 11/22/2015  . Basal cell carcinoma 11/22/2015  . Essential hypertension 06/21/2015  . Hyperlipemia 06/21/2015  . Gastroesophageal reflux disease with esophagitis 06/21/2015  . Calculus of kidney 11/18/2013    Past Surgical History:  Procedure Laterality Date  . APPENDECTOMY    . KIDNEY STONE SURGERY    . KNEE SURGERY    .  SPINE SURGERY      Family History  Problem Relation Age of Onset  . Cancer Brother        bladder cancer with mets  . Heart disease Mother   . Aortic aneurysm Mother   . Heart attack Maternal Aunt   . Heart attack Maternal Uncle     Social History   Socioeconomic History  . Marital status: Widowed    Spouse name: Not on file  . Number of children: 6  . Years of education: Not on file  . Highest education level: Some college, no degree  Occupational History  . Occupation: retired    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  Social Needs  . Financial resource strain: Not hard at all  . Food insecurity:    Worry: Never true    Inability: Never true  . Transportation needs:    Medical: No    Non-medical: No  Tobacco Use  . Smoking status: Former Smoker    Years: 16.00    Types: Cigarettes    Start date: 10/21/1944    Last attempt to quit: 10/21/1960    Years since quitting: 57.3  . Smokeless tobacco: Never Used  Substance and Sexual Activity  . Alcohol use: Yes    Alcohol/week: 1.8 oz    Types: 3 Standard drinks or equivalent per week    Comment: once a week when he goes out to dinner-socially  .  Drug use: No  . Sexual activity: Yes    Partners: Female  Lifestyle  . Physical activity:    Days per week: 2 days    Minutes per session: 60 min  . Stress: Not at all  Relationships  . Social connections:    Talks on phone: Three times a week    Gets together: Twice a week    Attends religious service: More than 4 times per year    Active member of club or organization: Yes    Attends meetings of clubs or organizations: More than 4 times per year    Relationship status: Widowed  . Intimate partner violence:    Fear of current or ex partner: No    Emotionally abused: No    Physically abused: No    Forced sexual activity: No  Other Topics Concern  . Not on file  Social History Narrative   Widow, has a good networks of friends and family      Current  Outpatient Medications:  .  brimonidine (ALPHAGAN) 0.2 % ophthalmic solution, Place 1 drop into both eyes 2 (two) times daily. , Disp: , Rfl:  .  Cholecalciferol (VITAMIN D3) 2000 units TABS, Take 1 tablet by mouth daily., Disp: , Rfl:  .  CIALIS 5 MG tablet, Take 5 mg by mouth daily. , Disp: , Rfl:  .  finasteride (PROSCAR) 5 MG tablet, Take 5 mg by mouth daily. , Disp: , Rfl:  .  lisinopril (PRINIVIL,ZESTRIL) 10 MG tablet, Take 1 tablet (10 mg total) daily by mouth., Disp: 90 tablet, Rfl: 1 .  Omega-3 Fatty Acids (FISH OIL) 1000 MG CAPS, Take 1 capsule by mouth daily., Disp: , Rfl:  .  pantoprazole (PROTONIX) 40 MG tablet, Take 1 tablet (40 mg total) by mouth daily., Disp: 90 tablet, Rfl: 1 .  lubiprostone (AMITIZA) 8 MCG capsule, Take 1 capsule (8 mcg total) by mouth 2 (two) times daily with a meal. (Patient not taking: Reported on 03/01/2018), Disp: 180 capsule, Rfl: 0  Allergies  Allergen Reactions  . Penicillins Itching and Swelling  . Gabapentin Hives    GI upset.     ROS  Ten systems reviewed and is negative except as mentioned in HPI   Objective  Vitals:   03/01/18 1258  BP: (!) 190/74  Pulse: (!) 55  Resp: 16  SpO2: 97%  Weight: 205 lb 1.6 oz (93 kg)  Height: 5\' 10"  (1.778 m)    Body mass index is 29.43 kg/m.  Physical Exam  Constitutional: Patient appears well-developed and well-nourished. Obese  No distress.  HEENT: head atraumatic, normocephalic, pupils equal and reactive to light, neck supple, throat within normal limits Cardiovascular: Normal rate, regular rhythm and normal heart sounds.  No murmur heard. Trace  BLE edema. Pulmonary/Chest: Effort normal and breath sounds normal. No respiratory distress. Abdominal: Soft.  There is mild  Tenderness during palpation of right lower quadrant and also positive CVA on the right  Psychiatric: Patient has a normal mood and affect. behavior is normal. Judgment and thought content normal. Muscular skeletal: normal  flexion and extension, pain with right lateral bending.   Recent Results (from the past 2160 hour(s))  POCT urinalysis dipstick     Status: Abnormal   Collection Time: 02/05/18  8:25 AM  Result Value Ref Range   Color, UA gold    Clarity, UA cloudy    Glucose, UA 250    Bilirubin, UA negative    Ketones, UA negative  Spec Grav, UA 1.020 1.010 - 1.025   Blood, UA large    pH, UA 5.0 5.0 - 8.0   Protein, UA trace    Urobilinogen, UA negative (A) 0.2 or 1.0 E.U./dL   Nitrite, UA positive    Leukocytes, UA Large (3+) (A) Negative   Appearance cloudy    Odor none   Urine Culture     Status: Abnormal   Collection Time: 02/05/18 10:19 AM  Result Value Ref Range   MICRO NUMBER: [id number]    SPECIMEN QUALITY: ADEQUATE    Sample Source URINE    STATUS: FINAL    ISOLATE 1: Escherichia coli (A)     Comment: Greater than 100,000 CFU/mL of Escherichia coli      Susceptibility   Escherichia coli - URINE CULTURE, REFLEX    AMOX/CLAVULANIC 16 Intermediate     AMPICILLIN >=32 Resistant     AMPICILLIN/SULBACTAM >=32 Resistant     CEFAZOLIN* 16 Resistant      * For uncomplicated UTI caused by E. coli,K. pneumoniae or P. mirabilis: Cefazolin issusceptible if MIC <32 mcg/mL and predictssusceptible to the oral agents cefaclor, cefdinir,cefpodoxime, cefprozil, cefuroxime, cephalexinand loracarbef.    CEFEPIME <=1 Sensitive     CEFTRIAXONE <=1 Sensitive     CIPROFLOXACIN <=0.25 Sensitive     LEVOFLOXACIN <=0.12 Sensitive     ERTAPENEM <=0.5 Sensitive     GENTAMICIN <=1 Sensitive     IMIPENEM <=0.25 Sensitive     NITROFURANTOIN <=16 Sensitive     PIP/TAZO <=4 Sensitive     TOBRAMYCIN <=1 Sensitive     TRIMETH/SULFA* <=20 Sensitive      * For uncomplicated UTI caused by E. coli,K. pneumoniae or P. mirabilis: Cefazolin issusceptible if MIC <32 mcg/mL and predictssusceptible to the oral agents cefaclor, cefdinir,cefpodoxime, cefprozil, cefuroxime, cephalexinand loracarbef.Legend:S = Susceptible  I  = IntermediateR = Resistant  NS = Not susceptible* = Not tested  NR = Not reported**NN = See antimicrobic comments     PHQ2/9: Depression screen [facility] 2/9 09/09/2017 07/07/2017 11/18/2016 09/23/2016 09/16/2016  Decreased Interest 0 0 0 0 0  Down, Depressed, Hopeless 0 0 0 0 0  PHQ - 2 Score 0 0 0 0 0    Fall Risk: Fall Risk  03/01/2018 01/07/2018 10/06/2017 09/09/2017 07/07/2017  Falls in the past year? No No No No No  Number falls in past yr: - - - - -  Injury with Fall? - - - - -  Follow up - - - - -    Functional Status Survey: Is the patient deaf or have difficulty hearing?: No Does the patient have difficulty seeing, even when wearing glasses/contacts?: No Does the patient have difficulty concentrating, remembering, or making decisions?: No Does the patient have difficulty walking or climbing stairs?: No Does the patient have difficulty dressing or bathing?: No Does the patient have difficulty doing errands alone such as visiting a doctor's office or shopping?: No    Assessment & Plan  1. Acute right-sided low back pain without sciatica  - CULTURE, URINE COMPREHENSIVE  2. Uncontrolled hypertension  Needs to take full tablet of mediation again, and stop nsaid's  3. Recurrent UTI  - CULTURE, URINE COMPREHENSIVE  4. History of kidney stones  - CULTURE, URINE COMPREHENSIVE - CT RENAL STONE STUDY; Future  5. Right lower quadrant abdominal pain  - CT RENAL STONE STUDY; Future - Ambulatory referral to Urology

## 2018-03-04 LAB — CULTURE, URINE COMPREHENSIVE
MICRO NUMBER:: 90402043
SPECIMEN QUALITY:: ADEQUATE

## 2018-03-05 ENCOUNTER — Telehealth: Payer: Self-pay

## 2018-03-05 MED ORDER — NITROFURANTOIN MONOHYD MACRO 100 MG PO CAPS: 100.0000 mg | ORAL_CAPSULE | Freq: Two times a day (BID) | ORAL | 0 refills | Status: AC

## 2018-03-05 NOTE — Telephone Encounter (Signed)
Patient daughter states her father Jose Castro seen Dr. Rogers Blocker and performed a X-Ray and urine test which all came back normal. Dr. Rogers Blocker thinks it is a pinched nerve or related to it. Informed daughter Dr. Ancil Boozer states he has urine infection and will call in Macrobid bid to [facility].

## 2018-03-05 NOTE — Telephone Encounter (Signed)
-----   Message from Steele Sizer, MD sent at 03/04/2018  9:08 PM EDT ----- I sent results to his urologist. Dr. Yves Dill Please verify if he is taking antibiotics.  Thank you

## 2018-04-07 ENCOUNTER — Ambulatory Visit: Payer: Medicare Other | Admitting: Family Medicine

## 2018-04-16 ENCOUNTER — Telehealth: Payer: Self-pay | Admitting: Family Medicine

## 2018-04-16 NOTE — Telephone Encounter (Signed)
Spoke with daughter, Teresa Lemmerman, and informed her of what Dr. Ancil Boozer recommended.  And that we still had him scheduled to come in on Tues. 04/20/18.  Daughter verbalized understanding.

## 2018-04-16 NOTE — Telephone Encounter (Signed)
Patient's daughter called stating that patient has been experiencing right foot and ankle swelling. The earliest we could schedule patient to come in to be seen is on Tues. 04/20/18.  I did inform his daughter that if we have any cancellations on Monday we would call to get him in.  In the meantime patient's daughter would like to know if something can be called in to help with the swelling.  Patient uses [facility].

## 2018-04-16 NOTE — Telephone Encounter (Signed)
Elevate and ice it, go to [facility] if needed, we will see him Monday at 9:20

## 2018-04-19 ENCOUNTER — Ambulatory Visit: Payer: Medicare Other | Admitting: Family Medicine

## 2018-04-20 ENCOUNTER — Encounter: Payer: Self-pay | Admitting: Nurse Practitioner

## 2018-04-20 ENCOUNTER — Ambulatory Visit
Admission: RE | Admit: 2018-04-20 | Discharge: 2018-04-20 | Disposition: A | Discharge status: Home / Self Care | Payer: Medicare Other | Source: Ambulatory Visit | Attending: Nurse Practitioner | Admitting: Nurse Practitioner

## 2018-04-20 ENCOUNTER — Ambulatory Visit: Payer: Medicare Other | Admitting: Nurse Practitioner

## 2018-04-20 VITALS — BP 138/68 | HR 56 | Temp 98.3°F | Resp 16 | Ht 70.0 in | Wt 198.7 lb

## 2018-04-20 DIAGNOSIS — M7989 Other specified soft tissue disorders: Secondary | ICD-10-CM | POA: Diagnosis present

## 2018-04-20 DIAGNOSIS — R600 Localized edema: Secondary | ICD-10-CM | POA: Diagnosis not present

## 2018-04-20 DIAGNOSIS — I739 Peripheral vascular disease, unspecified: Secondary | ICD-10-CM

## 2018-04-20 NOTE — Patient Instructions (Addendum)
Low sodium diet Compression stocking every day, off at night Elevate legs whenever resting   How to Use Compression Stockings Compression stockings are elastic socks that squeeze the legs. They help to increase blood flow to the legs, decrease swelling in the legs, and reduce the chance of developing blood clots in the lower legs. Compression stockings are often used by people who:  Are recovering from surgery.  Have poor circulation in their legs.  Are prone to getting blood clots in their legs.  Have varicose veins.  Sit or stay in bed for long periods of time.  How to use compression stockings Before you put on your compression stockings:  Make sure that they are the correct size. If you do not know your size, ask your health care provider.  Make sure that they are clean, dry, and in good condition.  Check them for rips and tears. Do not put them on if they are ripped or torn.  Put your stockings on first thing in the morning, before you get out of bed. Keep them on for as long as your health care provider advises. When you are wearing your stockings:  Keep them as smooth as possible. Do not allow them to bunch up. It is especially important to prevent the stockings from bunching up around your toes or behind your knees.  Do not roll the stockings downward and leave them rolled down. This can decrease blood flow to your leg.  Change them right away if they become wet or dirty.  When you take off your stockings, inspect your legs and feet. Anything that does not seem normal may require medical attention. Look for:  Open sores.  Red spots.  Swelling.  Information and tips  Do not stop wearing your compression stockings without talking to your health care provider first.  Wash your stockings every day with mild detergent in cold or warm water. Do not use bleach. Air-dry your stockings or dry them in a clothes dryer on low heat.  Replace your stockings every 3-6  months.  If skin moisturizing is part of your treatment plan, apply lotion or cream at night so that your skin will be dry when you put on the stockings in the morning. It is harder to put the stockings on when you have lotion on your legs or feet. Contact a health care provider if: Remove your stockings and seek medical care if:  You have a feeling of pins and needles in your feet or legs.  You have any new changes in your skin.  You have skin lesions that are getting worse.  You have swelling or pain that is getting worse.  Get help right away if:  You have numbness or tingling in your lower legs that does not get better right after you take the stockings off.  Your toes or feet become cold and blue.  You develop open sores or red spots on your legs that do not go away.  You see or feel a warm spot on your leg.  You have new swelling or soreness in your leg.  You are short of breath or you have chest pain for no reason.  You have a rapid or irregular heartbeat.  You feel light-headed or dizzy. This information is not intended to replace advice given to you by your health care provider. Make sure you discuss any questions you have with your health care provider. Document Released: 09/14/2009 Document Revised: 04/16/2016 Document Reviewed: 10/25/2014 Elsevier Interactive  Patient Education  [employer].

## 2018-04-20 NOTE — Progress Notes (Addendum)
Name: Jose Castro   MRN: [id number]    DOB: Sep 18, 1926   Date:04/20/2018       Progress Note  Subjective  Chief Complaint  Chief Complaint  Patient presents with  . Ankle Pain    left foot and ankle swelling    HPI  Patient has bilateral leg swelling ongoing for ten years or so worse on the left side with dull pain. Patient sts has been going on since he had back surgery. Patient states daughter noticed it and requested he come in to get seen. Patient follows up with orthopedic for left knee pain and gets joint injections. Patient states elevates legs, tried to put ice on it with minimal relief. Got compression stockings and wears them often- unless he has an appointment.  States left leg swelling has increased up to knee which is new, usually just to ankle.  Dr. Delana Meyer- vascular saw patient for spontaneous splenic vein infarct and cleared.  Podiatry appointment June 3rd- was playing golf accidentally pulled up on left toe nail bed and nail is black.   Denies shortness of breath, chest pain, dizziness, near syncope, fatigue.   Patient Active Problem List   Diagnosis Date Noted  . Coagulopathy ([place]) 07/07/2017  . Lumbar spondylosis 03/10/2017  . Elevated uric acid in blood 11/20/2016  . BPH (benign prostatic hyperplasia) 05/29/2016  . Peripheral vascular disease of lower extremity ([place]) 03/03/2016  . Gallstone 12/31/2015  . 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 Atherosclerosis of aorta ([place]) 11/22/2015  . Carotid artery narrowing 11/22/2015  . Diverticulosis of colon 11/22/2015  . Decreased creatinine clearance 11/22/2015  . Arthritis, degenerative 11/22/2015  . Lactose intolerance 11/22/2015  . Basal cell carcinoma 11/22/2015  . Essential hypertension 06/21/2015  . Hyperlipemia 06/21/2015  . Gastroesophageal reflux disease with esophagitis 06/21/2015  . Calculus of kidney 11/18/2013    Past Medical History:  Diagnosis Date  . BPH (benign  prostatic hyperplasia)   . Chronic kidney disease    had a kidney stone which per family was a cyst that was removed  . GERD (gastroesophageal reflux disease)   . Hyperlipidemia   . Hypertension   . Splenic vein thrombosis   . Thrombosis 12/2015   mural  area and no notation of heart attack    Past Surgical History:  Procedure Laterality Date  . APPENDECTOMY    . KIDNEY STONE SURGERY    . KNEE SURGERY    . SPINE SURGERY      Social History   Tobacco Use  . Smoking status: Former Smoker    Years: 16.00    Types: Cigarettes    Start date: 10/21/1944    Last attempt to quit: 10/21/1960    Years since quitting: 57.5  . Smokeless tobacco: Never Used  Substance Use Topics  . Alcohol use: Yes    Alcohol/week: 1.8 oz    Types: 3 Standard drinks or equivalent per week    Comment: once a week when he goes out to dinner-socially     Current Outpatient Medications:  .  brimonidine (ALPHAGAN) 0.2 % ophthalmic solution, Place 1 drop into both eyes 2 (two) times daily. , Disp: , Rfl:  .  Cholecalciferol (VITAMIN D3) 2000 units TABS, Take 1 tablet by mouth daily., Disp: , Rfl:  .  CIALIS 5 MG tablet, Take 5 mg by mouth daily. , Disp: , Rfl:  .  finasteride (PROSCAR) 5 MG tablet, Take 5 mg by mouth daily. ,  Disp: , Rfl:  .  lisinopril (PRINIVIL,ZESTRIL) 10 MG tablet, Take 1 tablet (10 mg total) daily by mouth., Disp: 90 tablet, Rfl: 1 .  Omega-3 Fatty Acids (FISH OIL) 1000 MG CAPS, Take 1 capsule by mouth daily., Disp: , Rfl:  .  pantoprazole (PROTONIX) 40 MG tablet, Take 1 tablet (40 mg total) by mouth daily., Disp: 90 tablet, Rfl: 1 .  lubiprostone (AMITIZA) 8 MCG capsule, Take 1 capsule (8 mcg total) by mouth 2 (two) times daily with a meal. (Patient not taking: Reported on 03/01/2018), Disp: 180 capsule, Rfl: 0  Allergies  Allergen Reactions  . Penicillins Itching and Swelling  . Gabapentin Hives    GI upset.    ROS  Constitutional: Negative for fever or weight change.   Respiratory: Negative for cough and shortness of breath.   Cardiovascular: Negative for chest pain or palpitations.  Gastrointestinal: Negative for abdominal pain, no bowel changes.  Musculoskeletal: Negative for gait problem or joint swelling.  Skin: Negative for rash.  Neurological: Negative for dizziness or headache.  No other specific complaints in a complete review of systems (except as listed in HPI above).  Objective  Vitals:   04/20/18 1355  BP: 138/68  Pulse: (!) 56  Resp: 16  Temp: 98.3 F (36.8 C)  TempSrc: Oral  SpO2: 97%  Weight: 198 lb 11.2 oz (90.1 kg)  Height: 5\' 10"  (1.778 m)    Body mass index is 28.51 kg/m.  Nursing Note and Vital Signs reviewed.  Physical Exam   Constitutional: Patient appears well-developed and well-nourished.  No distress.  Cardiovascular: low rate, regular rhythm, S1/S2 present.  No murmur or rub heard. Patient has bilateral lower extremity edema, left leg- from below knee down to foot 3+ pitting edema, right leg- trace edema in calf and 2+ pitting edema in ankle and foot, No cellulitis noted. Pulses intact.  Pulmonary/Chest: Effort normal and breath sounds clear. No respiratory distress or retractions. Abdominal: Soft and non-tender, bowel sounds present  Psychiatric: Patient has a normal mood and affect. behavior is normal. Judgment and thought content normal.  No results found for this or any previous visit (from the past 72 hour(s)).  Assessment & Plan  1. Peripheral vascular disease of lower extremity ([place]) - Ambulatory referral to Vascular Surgery - COMPLETE METABOLIC PANEL WITH GFR - US Venous Img Lower Unilateral Left; Future - US Venous Img Lower Unilateral Left  2. Leg edema, left Discussed with Dr. Ancil Boozer due to increase in swelling in just left leg and hx coagulapathy and spontaneous splenic clot will order [place] of left lower leg. Consider: DVT vs PVD, less likely CHF, CKD, liver failure due to lack of corroborating  symptoms will refer to vascular, continue elevation, compression, discussed low sodium diet.  - COMPLETE METABOLIC PANEL WITH GFR - US Venous Img Lower Unilateral Left; Future - US Venous Img Lower Unilateral Left    -Red flags and when to present for emergency care or RTC including fever >101.64F, chest pain, shortness of breath, new/worsening/un-resolving symptoms, reviewed with patient at time of visit. Follow up and care instructions discussed and provided in AVS.  I have reviewed this encounter including the documentation in this note and/or discussed this patient with the provider, Suezanne Cheshire DNP AGNP-C. I am certifying that I agree with the content of this note as supervising physician. Steele Sizer, MD [facility] 04/20/2018, 4:54 PM

## 2018-04-21 LAB — COMPLETE METABOLIC PANEL WITH GFR
AG RATIO: 1.9 (calc) (ref 1.0–2.5)
ALT: 7 U/L — AB (ref 9–46)
AST: 10 U/L (ref 10–35)
Albumin: 3.9 g/dL (ref 3.6–5.1)
Alkaline phosphatase (APISO): 63 U/L (ref 40–115)
BUN/Creatinine Ratio: 20 (calc) (ref 6–22)
BUN: 23 mg/dL (ref 7–25)
CHLORIDE: 106 mmol/L (ref 98–110)
CO2: 27 mmol/L (ref 20–32)
Calcium: 9.3 mg/dL (ref 8.6–10.3)
Creat: 1.17 mg/dL — ABNORMAL HIGH (ref 0.70–1.11)
GFR, Est African American: 64 mL/min/{1.73_m2} (ref >=60)
GFR, Est Non African American: 55 mL/min/{1.73_m2} — ABNORMAL LOW (ref >=60)
Globulin: 2.1 g/dL (calc) (ref 1.9–3.7)
Glucose, Bld: 84 mg/dL (ref 65–99)
POTASSIUM: 4.8 mmol/L (ref 3.5–5.3)
Sodium: 140 mmol/L (ref 135–146)
Total Bilirubin: 0.7 mg/dL (ref 0.2–1.2)
Total Protein: 6 g/dL — ABNORMAL LOW (ref 6.1–8.1)

## 2018-04-26 ENCOUNTER — Other Ambulatory Visit: Payer: Self-pay | Admitting: Family Medicine

## 2018-04-26 DIAGNOSIS — I10 Essential (primary) hypertension: Secondary | ICD-10-CM

## 2018-05-06 ENCOUNTER — Encounter (INDEPENDENT_AMBULATORY_CARE_PROVIDER_SITE_OTHER): Payer: Self-pay | Admitting: Vascular Surgery

## 2018-05-06 ENCOUNTER — Ambulatory Visit (INDEPENDENT_AMBULATORY_CARE_PROVIDER_SITE_OTHER): Payer: Medicare Other | Admitting: Vascular Surgery

## 2018-05-06 VITALS — BP 142/67 | HR 45 | Resp 15 | Ht 70.0 in | Wt 194.0 lb

## 2018-05-06 DIAGNOSIS — I10 Essential (primary) hypertension: Secondary | ICD-10-CM | POA: Diagnosis not present

## 2018-05-06 DIAGNOSIS — M79605 Pain in left leg: Secondary | ICD-10-CM

## 2018-05-06 DIAGNOSIS — I6523 Occlusion and stenosis of bilateral carotid arteries: Secondary | ICD-10-CM

## 2018-05-06 DIAGNOSIS — E782 Mixed hyperlipidemia: Secondary | ICD-10-CM

## 2018-05-06 DIAGNOSIS — I739 Peripheral vascular disease, unspecified: Secondary | ICD-10-CM

## 2018-05-06 DIAGNOSIS — I890 Lymphedema, not elsewhere classified: Secondary | ICD-10-CM | POA: Insufficient documentation

## 2018-05-06 DIAGNOSIS — K2100 Gastro-esophageal reflux disease with esophagitis, without bleeding: Secondary | ICD-10-CM

## 2018-05-06 DIAGNOSIS — M79606 Pain in leg, unspecified: Secondary | ICD-10-CM | POA: Insufficient documentation

## 2018-05-06 NOTE — Progress Notes (Signed)
MRN : [id number]  Jose Castro is a 84 y.o. (February 16, 1926) male who presents with chief complaint of  Chief Complaint  Patient presents with  . Follow-up    PVD  .  History of Present Illness:   Patient is seen for evaluation of leg pain and leg swelling. The patient first noticed the swelling remotely. The swelling is associated with pain and discoloration. The pain and swelling worsens with prolonged dependency and improves with elevation. The pain is unrelated to activity.  The patient notes that in the morning the legs are significantly improved but they steadily worsened throughout the course of the day. The patient also notes a steady worsening of the discoloration in the ankle and shin area.   The patient denies claudication symptoms.  The patient denies symptoms consistent with rest pain.  The patient denies and extensive history of DJD and LS spine disease.  The patient has no had any past angiography, interventions or vascular surgery.  Elevation makes the leg symptoms better, dependency makes them much worse. There is no history of ulcerations. The patient denies any recent changes in medications.  The patient has not been wearing graduated compression.  The patient denies a history of DVT or PE. There is no prior history of phlebitis. There is no history of primary lymphedema.  No history of malignancies. No history of trauma or groin or pelvic surgery. There is no history of radiation treatment to the groin or pelvis  The patient denies amaurosis fugax or recent TIA symptoms. There are no recent neurological changes noted. The patient denies recent episodes of angina or shortness of breath    No outpatient medications have been marked as taking for the 05/06/18 encounter (Office Visit) with Delana Meyer, Dolores Lory, MD.    Past Medical History:  Diagnosis Date  . BPH (benign prostatic hyperplasia)   . Chronic kidney disease    had a kidney stone which per family was a  cyst that was removed  . GERD (gastroesophageal reflux disease)   . Hyperlipidemia   . Hypertension   . Splenic vein thrombosis   . Thrombosis 12/2015   mural  area and no notation of heart attack    Past Surgical History:  Procedure Laterality Date  . APPENDECTOMY    . KIDNEY STONE SURGERY    . KNEE SURGERY    . SPINE SURGERY      Social History Social History   Tobacco Use  . Smoking status: Former Smoker    Years: 16.00    Types: Cigarettes    Start date: 10/21/1944    Last attempt to quit: 10/21/1960    Years since quitting: 57.5  . Smokeless tobacco: Never Used  Substance Use Topics  . Alcohol use: Yes    Alcohol/week: 1.8 oz    Types: 3 Standard drinks or equivalent per week    Comment: once a week when he goes out to dinner-socially  . Drug use: No    Family History Family History  Problem Relation Age of Onset  . Cancer Brother        bladder cancer with mets  . Heart disease Mother   . Aortic aneurysm Mother   . Heart attack Maternal Aunt   . Heart attack Maternal Uncle     Allergies  Allergen Reactions  . Penicillins Itching and Swelling  . Gabapentin Hives    GI upset.     REVIEW OF SYSTEMS (Negative unless checked)  Constitutional: [] Weight loss  []   Fever  [] Chills Cardiac: [] Chest pain   [] Chest pressure   [] Palpitations   [] Shortness of breath when laying flat   [] Shortness of breath with exertion. Vascular:  [x] Pain in legs with walking   [x] Pain in legs at rest  [] History of DVT   [] Phlebitis   [x] Swelling in legs   [] Varicose veins   [] Non-healing ulcers Pulmonary:   [] Uses home oxygen   [] Productive cough   [] Hemoptysis   [] Wheeze  [] COPD   [] Asthma Neurologic:  [] Dizziness   [] Seizures   [] History of stroke   [] History of TIA  [] Aphasia   [] Vissual changes   [] Weakness or numbness in arm   [] Weakness or numbness in leg Musculoskeletal:   [] Joint swelling   [] Joint pain   [] Low back pain Hematologic:  [] Easy bruising  [] Easy bleeding    [] Hypercoagulable state   [] Anemic Gastrointestinal:  [] Diarrhea   [] Vomiting  [] Gastroesophageal reflux/heartburn   [] Difficulty swallowing. Genitourinary:  [] Chronic kidney disease   [] Difficult urination  [] Frequent urination   [] Blood in urine Skin:  [] Rashes   [] Ulcers  Psychological:  [] History of anxiety   []  History of major depression.  Physical Examination  Vitals:   05/06/18 1046  BP: (!) 142/67  Pulse: (!) 45  Resp: 15  Weight: 194 lb (88 kg)  Height: 5\' 10"  (1.778 m)   Body mass index is 27.84 kg/m. Gen: WD/WN, NAD Head: [place]/AT, No temporalis wasting.  Ear/Nose/Throat: Hearing grossly intact, nares w/o erythema or drainage Eyes: PER, EOMI, sclera nonicteric.  Neck: Supple, no large masses.   Pulmonary:  Good air movement, no audible wheezing bilaterally, no use of accessory muscles.  Cardiac: RRR, no JVD Vascular: scattered varicosities present bilaterally.  Mild venous stasis changes to the legs bilaterally.  3+ soft pitting edema Vessel Right Left  Radial Palpable Palpable  Popliteal Trace Palpable 2+ enlarged Palpable  PT Not Palpable Not Palpable  DP Not Palpable Not Palpable  Gastrointestinal: Non-distended. No guarding/no peritoneal signs.  Musculoskeletal: M/S 5/5 throughout.  No deformity or atrophy.  Neurologic: CN 2-12 intact. Symmetrical.  Speech is fluent. Motor exam as listed above. Psychiatric: Judgment intact, Mood & affect appropriate for pt's clinical situation. Dermatologic: mild venous rashes no ulcers noted.  No changes consistent with cellulitis. Lymph : No lichenification or skin changes of chronic lymphedema.  CBC Lab Results  Component Value Date   WBC 6.8 07/07/2017   HGB 14.7 07/07/2017   HCT 44.1 07/07/2017   MCV 88.2 07/07/2017   PLT 291 07/07/2017    BMET    Component Value Date/Time   NA 140 04/20/2018 1500   NA 144 11/19/2016 0955   NA 138 11/27/2013 0819   K 4.8 04/20/2018 1500   K 4.1 11/27/2013 0819   CL 106  04/20/2018 1500   CL 107 11/27/2013 0819   CO2 27 04/20/2018 1500   CO2 23 11/27/2013 0819   GLUCOSE 84 04/20/2018 1500   GLUCOSE 101 (H) 11/27/2013 0819   BUN 23 04/20/2018 1500   BUN 25 11/19/2016 0955   BUN 22 (H) 11/27/2013 0819   CREATININE 1.17 (H) 04/20/2018 1500   CALCIUM 9.3 04/20/2018 1500   CALCIUM 8.2 (L) 11/27/2013 0819   GFRNONAA 55 (L) 04/20/2018 1500   GFRAA 64 04/20/2018 1500   Estimated Creatinine Clearance: 47.8 mL/min (A) (by C-G formula based on SCr of 1.17 mg/dL (H)).  COAG Lab Results  Component Value Date   INR 1.1 12/17/2015   INR 1.1 12/17/2015   INR  1.29 11/28/2015    Radiology US Venous Img Lower Unilateral Left  Result Date: 04/20/2018 CLINICAL DATA:  Left lower extremity edema. EXAM: LEFT LOWER EXTREMITY VENOUS DOPPLER ULTRASOUND TECHNIQUE: Gray-scale sonography with graded compression, as well as color Doppler and duplex ultrasound were performed to evaluate the lower extremity deep venous systems from the level of the common femoral vein and including the common femoral, femoral, profunda femoral, popliteal and calf veins including the posterior tibial, peroneal and gastrocnemius veins when visible. The superficial great saphenous vein was also interrogated. Spectral Doppler was utilized to evaluate flow at rest and with distal augmentation maneuvers in the common femoral, femoral and popliteal veins. COMPARISON:  None. FINDINGS: Contralateral Common Femoral Vein: Respiratory phasicity is normal and symmetric with the symptomatic side. No evidence of thrombus. Normal compressibility. Common Femoral Vein: No evidence of thrombus. Normal compressibility, respiratory phasicity and response to augmentation. Saphenofemoral Junction: No evidence of thrombus. Normal compressibility and flow on color Doppler imaging. Profunda Femoral Vein: No evidence of thrombus. Normal compressibility and flow on color Doppler imaging. Femoral Vein: No evidence of thrombus.  Normal compressibility, respiratory phasicity and response to augmentation. Popliteal Vein: No evidence of thrombus. Normal compressibility, respiratory phasicity and response to augmentation. Calf Veins: No evidence of thrombus. Normal compressibility and flow on color Doppler imaging. Superficial Great Saphenous Vein: No evidence of thrombus. Normal compressibility. Venous Reflux:  None. Other Findings: No evidence of superficial thrombophlebitis or abnormal fluid collection. IMPRESSION: No evidence of left lower extremity deep venous thrombosis. Electronically Signed   By: Aletta Edouard M.D.   On: 04/20/2018 16:05      Assessment/Plan 1. Pain of left lower extremity  Recommend:  The patient has atypical pain symptoms for pure atherosclerotic disease. However, on physical exam there is evidence of mixed venous and arterial disease, given the diminished pulses and the edema associated with venous changes of the legs.  Noninvasive studies including venous ultrasound of the legs was negative for DVT.  I will obtain an arterial duplex to assess the increased popliteal impulse. The patient will follow up with me to review these studies.  The patient should continue walking and begin a more formal exercise program. The patient should continue his antiplatelet therapy and aggressive treatment of the lipid abnormalities.  The patient should begin wearing graduated compression socks 15-20 mmHg strength to control edema. [employer] brand was recommended  - [place] Lower Ext Art Left; Future  2. Lymphedema I have had a long discussion with the patient regarding swelling and why it  causes symptoms.  Patient will begin wearing graduated compression stockings class 1 (20-30 mmHg) on a daily basis a prescription was given. The patient will  beginning wearing the stockings first thing in the morning and removing them in the evening. The patient is instructed specifically not to sleep in the stockings.   In  addition, behavioral modification will be initiated.  This will include frequent elevation, use of over the counter pain medications and exercise such as walking.  I have reviewed systemic causes for chronic edema such as liver, kidney and cardiac etiologies.  The patient denies problems with these organ systems.    Consideration for a lymph pump will also be made based upon the effectiveness of conservative therapy.  This would help to improve the edema control and prevent sequela such as ulcers and infections   Patient should undergo duplex ultrasound of the venous system to ensure that DVT or reflux is not present.  The patient will  follow-up with me after the ultrasound.    3. Peripheral vascular disease of lower extremity ([facility])  Recommend:  The patient has evidence of atherosclerosis of the lower extremities with claudication.  The patient does not voice lifestyle limiting changes at this point in time.  Noninvasive studies do not suggest clinically significant change.  No invasive studies, angiography or surgery at this time The patient should continue walking and begin a more formal exercise program.  The patient should continue antiplatelet therapy and aggressive treatment of the lipid abnormalities  No changes in the patient's medications at this time  The patient should continue wearing graduated compression socks 10-15 mmHg strength to control the mild edema.   - [place] Lower Ext Art Left; Future  4. Bilateral carotid artery stenosis Recommend:  Given the patient's asymptomatic subcritical stenosis no further invasive testing or surgery at this time.  Duplex ultrasound shows <50% stenosis bilaterally.  Continue antiplatelet therapy as prescribed Continue management of CAD, HTN and Hyperlipidemia Healthy heart diet,  encouraged exercise at least 4 times per week   5. Mixed hyperlipidemia Continue statin as ordered and reviewed, no changes at this time   6. Essential  hypertension Continue antihypertensive medications as already ordered, these medications have been reviewed and there are no changes at this time.   7. Gastroesophageal reflux disease with esophagitis Continue antihypertensive medications as already ordered, these medications have been reviewed and there are no changes at this time.  Avoidence of caffeine and alcohol  Moderate elevation of the head of the bed     Hortencia Pilar, MD  05/06/2018 10:48 AM

## 2018-05-11 ENCOUNTER — Ambulatory Visit
Admission: RE | Admit: 2018-05-11 | Discharge: 2018-05-11 | Disposition: A | Discharge status: Home / Self Care | Payer: Medicare Other | Source: Ambulatory Visit | Attending: Vascular Surgery | Admitting: Vascular Surgery

## 2018-05-11 DIAGNOSIS — M79605 Pain in left leg: Secondary | ICD-10-CM | POA: Diagnosis not present

## 2018-05-11 DIAGNOSIS — I739 Peripheral vascular disease, unspecified: Secondary | ICD-10-CM | POA: Diagnosis not present

## 2018-05-27 ENCOUNTER — Other Ambulatory Visit: Payer: Self-pay | Admitting: Family Medicine

## 2018-05-27 DIAGNOSIS — I10 Essential (primary) hypertension: Secondary | ICD-10-CM

## 2018-05-31 ENCOUNTER — Encounter (INDEPENDENT_AMBULATORY_CARE_PROVIDER_SITE_OTHER): Payer: Self-pay | Admitting: Vascular Surgery

## 2018-05-31 ENCOUNTER — Ambulatory Visit (INDEPENDENT_AMBULATORY_CARE_PROVIDER_SITE_OTHER): Payer: Medicare Other | Admitting: Vascular Surgery

## 2018-05-31 VITALS — BP 181/77 | HR 49 | Resp 13 | Ht 70.0 in | Wt 194.0 lb

## 2018-05-31 DIAGNOSIS — I6523 Occlusion and stenosis of bilateral carotid arteries: Secondary | ICD-10-CM

## 2018-05-31 DIAGNOSIS — I10 Essential (primary) hypertension: Secondary | ICD-10-CM | POA: Diagnosis not present

## 2018-05-31 DIAGNOSIS — I739 Peripheral vascular disease, unspecified: Secondary | ICD-10-CM

## 2018-05-31 DIAGNOSIS — I890 Lymphedema, not elsewhere classified: Secondary | ICD-10-CM

## 2018-05-31 DIAGNOSIS — E782 Mixed hyperlipidemia: Secondary | ICD-10-CM | POA: Diagnosis not present

## 2018-06-02 ENCOUNTER — Encounter (INDEPENDENT_AMBULATORY_CARE_PROVIDER_SITE_OTHER): Payer: Self-pay | Admitting: Vascular Surgery

## 2018-06-02 NOTE — Progress Notes (Signed)
MRN : [id number]  Jose Castro is a 84 y.o. ([birth year]) male who presents with chief complaint of  Chief Complaint  Patient presents with  . Follow-up    Ultrasound follow up  .  History of Present Illness: The patient returns to the office for followup and review of the noninvasive studies. There have been no interval changes in lower extremity symptoms. No interval shortening of the patient's claudication distance or development of rest pain symptoms. No new ulcers or wounds have occurred since the last visit.  There have been no significant changes to the patient's overall health care.  The patient denies amaurosis fugax or recent TIA symptoms. There are no recent neurological changes noted. The patient denies history of DVT, PE or superficial thrombophlebitis. The patient denies recent episodes of angina or shortness of breath.     No outpatient medications have been marked as taking for the 05/31/18 encounter (Office Visit) with Delana Meyer, Dolores Lory, MD.    Past Medical History:  Diagnosis Date  . BPH (benign prostatic hyperplasia)   . Chronic kidney disease    had a kidney stone which per family was a cyst that was removed  . GERD (gastroesophageal reflux disease)   . Hyperlipidemia   . Hypertension   . Splenic vein thrombosis   . Thrombosis 12/2015   mural  area and no notation of heart attack    Past Surgical History:  Procedure Laterality Date  . APPENDECTOMY    . KIDNEY STONE SURGERY    . KNEE SURGERY    . SPINE SURGERY      Social History Social History   Tobacco Use  . Smoking status: Former Smoker    Years: 16.00    Types: Cigarettes    Start date: 10/21/1944    Last attempt to quit: 10/21/1960    Years since quitting: 57.6  . Smokeless tobacco: Never Used  Substance Use Topics  . Alcohol use: Yes    Alcohol/week: 1.8 oz    Types: 3 Standard drinks or equivalent per week    Comment: once a week when he goes out to dinner-socially  . Drug use:  No    Family History Family History  Problem Relation Age of Onset  . Cancer Brother        bladder cancer with mets  . Heart disease Mother   . Aortic aneurysm Mother   . Heart attack Maternal Aunt   . Heart attack Maternal Uncle     Allergies  Allergen Reactions  . Penicillins Itching and Swelling  . Gabapentin Hives    GI upset.     REVIEW OF SYSTEMS (Negative unless checked)  Constitutional: [] Weight loss  [] Fever  [] Chills Cardiac: [] Chest pain   [] Chest pressure   [] Palpitations   [] Shortness of breath when laying flat   [] Shortness of breath with exertion. Vascular:  [x] Pain in legs with walking   [] Pain in legs at rest  [] History of DVT   [] Phlebitis   [x] Swelling in legs   [] Varicose veins   [] Non-healing ulcers Pulmonary:   [] Uses home oxygen   [] Productive cough   [] Hemoptysis   [] Wheeze  [] COPD   [] Asthma Neurologic:  [] Dizziness   [] Seizures   [] History of stroke   [] History of TIA  [] Aphasia   [] Vissual changes   [] Weakness or numbness in arm   [] Weakness or numbness in leg Musculoskeletal:   [] Joint swelling   [] Joint pain   [] Low back pain Hematologic:  [] Easy  bruising  [] Easy bleeding   [] Hypercoagulable state   [] Anemic Gastrointestinal:  [] Diarrhea   [] Vomiting  [] Gastroesophageal reflux/heartburn   [] Difficulty swallowing. Genitourinary:  [] Chronic kidney disease   [] Difficult urination  [] Frequent urination   [] Blood in urine Skin:  [] Rashes   [] Ulcers  Psychological:  [] History of anxiety   []  History of major depression.  Physical Examination  Vitals:   05/31/18 1422  BP: (!) 181/77  Pulse: (!) 49  Resp: 13  Weight: 194 lb (88 kg)  Height: 5\' 10"  (1.778 m)   Body mass index is 27.84 kg/m. Gen: WD/WN, NAD Head: /AT, No temporalis wasting.  Ear/Nose/Throat: Hearing grossly intact, nares w/o erythema or drainage Eyes: PER, EOMI, sclera nonicteric.  Neck: Supple, no large masses.   Pulmonary:  Good air movement, no audible wheezing  bilaterally, no use of accessory muscles.  Cardiac: RRR, no JVD Vascular: scattered varicosities present bilaterally.  moderate venous stasis changes to the legs bilaterally.  2-3+ soft pitting edema Vessel Right Left  Radial Palpable Palpable  PT Trace Palpable Trace Palpable  DP Trace Palpable Trace Palpable  Gastrointestinal: Non-distended. No guarding/no peritoneal signs.  Musculoskeletal: M/S 5/5 throughout.  No deformity or atrophy.  Neurologic: CN 2-12 intact. Symmetrical.  Speech is fluent. Motor exam as listed above. Psychiatric: Judgment intact, Mood & affect appropriate for pt's clinical situation. Dermatologic: venous rashes no ulcers noted.  No changes consistent with cellulitis. Lymph : No lichenification or skin changes of chronic lymphedema.  CBC Lab Results  Component Value Date   WBC 6.8 07/07/2017   HGB 14.7 07/07/2017   HCT 44.1 07/07/2017   MCV 88.2 07/07/2017   PLT 291 07/07/2017    BMET    Component Value Date/Time   NA 140 04/20/2018 1500   NA 144 11/19/2016 0955   NA 138 11/27/2013 0819   K 4.8 04/20/2018 1500   K 4.1 11/27/2013 0819   CL 106 04/20/2018 1500   CL 107 11/27/2013 0819   CO2 27 04/20/2018 1500   CO2 23 11/27/2013 0819   GLUCOSE 84 04/20/2018 1500   GLUCOSE 101 (H) 11/27/2013 0819   BUN 23 04/20/2018 1500   BUN 25 11/19/2016 0955   BUN 22 (H) 11/27/2013 0819   CREATININE 1.17 (H) 04/20/2018 1500   CALCIUM 9.3 04/20/2018 1500   CALCIUM 8.2 (L) 11/27/2013 0819   GFRNONAA 55 (L) 04/20/2018 1500   GFRAA 64 04/20/2018 1500   CrCl cannot be calculated (Patient's most recent lab result is older than the maximum 21 days allowed.).  COAG Lab Results  Component Value Date   INR 1.1 12/17/2015   INR 1.1 12/17/2015   INR 1.29 11/28/2015    Radiology [place] Lower Ext Art Left  Result Date: 05/11/2018 CLINICAL DATA:  84 year old male with a history of left leg pain. Cardiovascular risk factors include hypertension EXAM: LEFT LOWER  EXTREMITY ARTERIAL DUPLEX SCAN TECHNIQUE: Gray-scale sonography as well as color Doppler and duplex ultrasound was performed to evaluate the lower extremity arteries including the common, superficial and profunda femoral arteries, popliteal artery and calf arteries. COMPARISON:  None. FINDINGS: Left lower Extremity ABI: Not calculated Duplex: Directed duplex of the left lower extremity demonstrates triphasic common femoral artery, superficial femoral artery, popliteal artery. Triphasic waveform of the distal posterior tibial artery. Biphasic waveform of the distal anterior tibial artery. No elevated velocities identified. Triphasic waveform of the right common femoral artery. IMPRESSION: Triphasic waveform of the left femoropopliteal system, as well as the left posterior tibial artery.  Biphasic waveform of left anterior tibial artery. Electronically Signed   By: Corrie Mckusick D.O.   On: 05/11/2018 13:46      Assessment/Plan 1. Peripheral vascular disease of lower extremity (HCC)  Recommend:  The patient has evidence of atherosclerosis of the lower extremities with claudication.  The patient does not voice lifestyle limiting changes at this point in time.  Noninvasive studies do not suggest clinically significant change.  No invasive studies, angiography or surgery at this time The patient should continue walking and begin a more formal exercise program.  The patient should continue antiplatelet therapy and aggressive treatment of the lipid abnormalities  No changes in the patient's medications at this time  The patient should continue wearing graduated compression socks 10-15 mmHg strength to control the mild edema.   - VAS [place] ABI WITH/WO TBI; Future  2. Bilateral carotid artery stenosis Recommend:  Given the patient's asymptomatic subcritical stenosis no further invasive testing or surgery at this time.  Continue antiplatelet therapy as prescribed Continue management of CAD, HTN and  Hyperlipidemia Healthy heart diet,  encouraged exercise at least 4 times per week  3. Lymphedema No surgery or intervention at this point in time.    I have reviewed my discussion with the patient regarding venous insufficiency and secondary lymph edema and why it  causes symptoms. I have discussed with the patient the chronic skin changes that accompany these problems and the long term sequela such as ulceration and infection.  Patient will continue wearing graduated compression stockings class 1 (20-30 mmHg) on a daily basis a prescription was given to the patient to keep this updated. The patient will  put the stockings on first thing in the morning and removing them in the evening. The patient is instructed specifically not to sleep in the stockings.  In addition, behavioral modification including elevation during the day will be continued.  Diet and salt restriction was also discussed.  Previous duplex ultrasound of the lower extremities shows normal deep venous system, superficial reflux was not present.   Following the review of the ultrasound the patient will follow up in 12 months to reassess the degree of swelling and the control that graduated compression is offering.   The patient can be assessed for a Lymph Pump at that time.  However, at this time the patient states they are satisfied with the control compression and elevation is yielding.    4. Essential hypertension Continue antihypertensive medications as already ordered, these medications have been reviewed and there are no changes at this time.   5. Mixed hyperlipidemia Continue statin as ordered and reviewed, no changes at this time     Hortencia Pilar, MD  06/02/2018 3:25 PM

## 2018-06-15 ENCOUNTER — Ambulatory Visit
Admission: RE | Admit: 2018-06-15 | Discharge: 2018-06-15 | Disposition: A | Discharge status: Home / Self Care | Payer: Medicare Other | Source: Ambulatory Visit | Attending: Family Medicine | Admitting: Family Medicine

## 2018-06-15 ENCOUNTER — Encounter: Payer: Self-pay | Admitting: Family Medicine

## 2018-06-15 ENCOUNTER — Ambulatory Visit: Payer: Medicare Other | Admitting: Family Medicine

## 2018-06-15 VITALS — BP 180/80 | HR 60 | Temp 97.9°F | Resp 14 | Ht 70.0 in | Wt 189.7 lb

## 2018-06-15 DIAGNOSIS — R1011 Right upper quadrant pain: Secondary | ICD-10-CM | POA: Diagnosis not present

## 2018-06-15 DIAGNOSIS — N183 Chronic kidney disease, stage 3 unspecified: Secondary | ICD-10-CM | POA: Insufficient documentation

## 2018-06-15 DIAGNOSIS — Z87442 Personal history of urinary calculi: Secondary | ICD-10-CM | POA: Insufficient documentation

## 2018-06-15 DIAGNOSIS — Z9049 Acquired absence of other specified parts of digestive tract: Secondary | ICD-10-CM | POA: Diagnosis not present

## 2018-06-15 DIAGNOSIS — R198 Other specified symptoms and signs involving the digestive system and abdomen: Secondary | ICD-10-CM | POA: Insufficient documentation

## 2018-06-15 DIAGNOSIS — I739 Peripheral vascular disease, unspecified: Secondary | ICD-10-CM

## 2018-06-15 DIAGNOSIS — I10 Essential (primary) hypertension: Secondary | ICD-10-CM | POA: Diagnosis not present

## 2018-06-15 DIAGNOSIS — R634 Abnormal weight loss: Secondary | ICD-10-CM

## 2018-06-15 MED ORDER — AMLODIPINE BESYLATE 2.5 MG PO TABS
2.5000 mg | ORAL_TABLET | Freq: Every day | ORAL | 0 refills | Status: DC
Start: 2018-06-15 — End: 2018-06-21

## 2018-06-15 NOTE — Progress Notes (Addendum)
kub Name: Jose Castro   MRN: [id number]    DOB: 11-06-1926   Date:06/15/2018       Progress Note  Subjective  Chief Complaint  Chief Complaint  Patient presents with  . Follow-up    1 month F/U Keeps losing weight  . Peripheral vascular disease of lower extremity (HCC)    Wearing compresssion stocking and helping.  . Leg Edema, Left  . Weight Loss    Unexplained-thinks it could be his stomach-feels like he has a annoyance on right side    HPI  Weight loss: he states his diet has changed , he has a girlfriend and she is very sick and he has not seen her in 5 months. He states they used to go out to eat once a week. He is also skipping meals at times. Eating more fruit and vegetables. He states he has noticed some right upper quadrant pain/discomfort and noticed needs to have a bowel movements after every meal. No blood or mucus on in stools. No fever or chills. On previous CT it showed gallstones. He denies soiling but has a history of constipation. We will check KUB. He has lost 15 lbs in the past few months.   PVD: seen vascular surgeon, is wearing compression stocking hoses and swelling is down.   CKI: denies pruritus, voiding normally, recheck labs  Uncontrolled HTN: bp was slightly up during Dr. Vela Prose office, elevated today and the past visit in our office, only on lisinopril, we will check labs and start low dose of norvasc to take at night. No chest pain or palpitation, he states gets dizzy when he gets up quickly  Patient Active Problem List   Diagnosis Date Noted  . Chronic kidney disease, stage III (moderate) ([place]) 06/15/2018  . Leg pain 05/06/2018  . Lymphedema 05/06/2018  . Coagulopathy ([place]) 07/07/2017  . Lumbar spondylosis 03/10/2017  . Elevated uric acid in blood 11/20/2016  . BPH (benign prostatic hyperplasia) 05/29/2016  . Peripheral vascular disease of lower extremity ([place]) 03/03/2016  . Gallstone 12/31/2015  . 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 Splenic vein  thrombosis 11/27/2015  . Atherosclerosis of aorta ([place]) 11/22/2015  . Carotid artery narrowing 11/22/2015  . Diverticulosis of colon 11/22/2015  . Decreased creatinine clearance 11/22/2015  . Arthritis, degenerative 11/22/2015  . Lactose intolerance 11/22/2015  . Basal cell carcinoma 11/22/2015  . Essential hypertension 06/21/2015  . Hyperlipemia 06/21/2015  . Gastroesophageal reflux disease with esophagitis 06/21/2015  . Calculus of kidney 11/18/2013    Past Surgical History:  Procedure Laterality Date  . APPENDECTOMY    . KIDNEY STONE SURGERY    . KNEE SURGERY    . SPINE SURGERY      Family History  Problem Relation Age of Onset  . Cancer Brother        bladder cancer with mets  . Heart disease Mother   . Aortic aneurysm Mother   . Heart attack Maternal Aunt   . Heart attack Maternal Uncle     Social History   Socioeconomic History  . Marital status: Widowed    Spouse name: Not on file  . Number of children: 6  . Years of education: Not on file  . Highest education level: Some college, no degree  Occupational History  . Occupation: retired    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  Social Needs  . Financial resource strain: Not hard at all  . Food insecurity:    Worry: Never  true    Inability: Never true  . Transportation needs:    Medical: No    Non-medical: No  Tobacco Use  . Smoking status: Former Smoker    Years: 16.00    Types: Cigarettes    Start date: 10/21/1944    Last attempt to quit: 10/21/1960    Years since quitting: 57.6  . Smokeless tobacco: Never Used  Substance and Sexual Activity  . Alcohol use: Yes    Alcohol/week: 1.8 oz    Types: 3 Standard drinks or equivalent per week    Comment: once a week when he goes out to dinner-socially  . Drug use: No  . Sexual activity: Yes    Partners: Female  Lifestyle  . Physical activity:    Days per week: 2 days    Minutes per session: 60 min  . Stress: Not at all  Relationships  .  Social connections:    Talks on phone: Three times a week    Gets together: Twice a week    Attends religious service: More than 4 times per year    Active member of club or organization: Yes    Attends meetings of clubs or organizations: More than 4 times per year    Relationship status: Widowed  . Intimate partner violence:    Fear of current or ex partner: No    Emotionally abused: No    Physically abused: No    Forced sexual activity: No  Other Topics Concern  . Not on file  Social History Narrative   Widow, has a good networks of friends and family      Current Outpatient Medications:  .  brimonidine (ALPHAGAN) 0.2 % ophthalmic solution, Place 1 drop into both eyes 2 (two) times daily. , Disp: , Rfl:  .  Cholecalciferol (VITAMIN D3) 2000 units TABS, Take 1 tablet by mouth daily., Disp: , Rfl:  .  CIALIS 5 MG tablet, Take 5 mg by mouth daily. , Disp: , Rfl:  .  finasteride (PROSCAR) 5 MG tablet, Take 5 mg by mouth daily. , Disp: , Rfl:  .  lisinopril (PRINIVIL,ZESTRIL) 10 MG tablet, TAKE ONE TABLET BY MOUTH EVERY DAY, Disp: 30 tablet, Rfl: 0 .  Omega-3 Fatty Acids (FISH OIL) 1000 MG CAPS, Take 1 capsule by mouth daily., Disp: , Rfl:  .  pantoprazole (PROTONIX) 40 MG tablet, Take 1 tablet (40 mg total) by mouth daily., Disp: 90 tablet, Rfl: 1 .  ranitidine (ZANTAC) 150 MG capsule, Take 150 mg by mouth 2 (two) times daily., Disp: , Rfl:  .  amLODipine (NORVASC) 2.5 MG tablet, Take 1 tablet (2.5 mg total) by mouth daily., Disp: 30 tablet, Rfl: 0  Allergies  Allergen Reactions  . Penicillins Itching and Swelling  . Gabapentin Hives    GI upset.     ROS  Constitutional: Negative for fever, positive for weight change - 15 lbs in the past few months .  Respiratory: Negative for cough and shortness of breath.   Cardiovascular: Negative for chest pain or palpitations.  Gastrointestinal: Positive for abdominal pain and  bowel changes.  Musculoskeletal: Negative for gait problem  or joint swelling.  Skin: Negative for rash.  Neurological: Negative for dizziness or headache.  No other specific complaints in a complete review of systems (except as listed in HPI above).  Objective  Vitals:   06/15/18 1458 06/15/18 1522  BP: (!) 178/82 (!) 180/80  Pulse: 60   Resp: 14   Temp: 97.9 F (  36.6 C)   TempSrc: Oral   SpO2: 95%   Weight: 189 lb 11.2 oz (86 kg)   Height: 5\' 10"  (1.778 m)     Body mass index is 27.22 kg/m.  Physical Exam  Constitutional: Patient appears well-developed and well-nourished.  No distress.  HEENT: head atraumatic, normocephalic, pupils equal and reactive to light, neck supple, throat within normal limits Cardiovascular: Normal rate, regular rhythm and normal heart sounds.  No murmur heard. No BLE edema. Pulmonary/Chest: Effort normal and breath sounds normal. No respiratory distress. Abdominal: Soft.  There is no tenderness. No guarding or rebound, normal bowel sounds. No pain during exam  Psychiatric: Patient has a normal mood and affect. behavior is normal. Judgment and thought content normal.  Recent Results (from the past 2160 hour(s))  COMPLETE METABOLIC PANEL WITH GFR     Status: Abnormal   Collection Time: 04/20/18  3:00 PM  Result Value Ref Range   Glucose, Bld 84 65 - 99 mg/dL    Comment: .            Fasting reference interval .    BUN 23 7 - 25 mg/dL   Creat 1.17 (H) 0.70 - 1.11 mg/dL    Comment: For patients >34 years of age, the reference limit for Creatinine is approximately 13% higher for people identified as African-American. .    GFR, Est Non African American 55 (L) > OR = 60 mL/min/1.21m2   GFR, Est African American 64 > OR = 60 mL/min/1.59m2   BUN/Creatinine Ratio 20 6 - 22 (calc)   Sodium 140 135 - 146 mmol/L   Potassium 4.8 3.5 - 5.3 mmol/L   Chloride 106 98 - 110 mmol/L   CO2 27 20 - 32 mmol/L   Calcium 9.3 8.6 - 10.3 mg/dL   Total Protein 6.0 (L) 6.1 - 8.1 g/dL   Albumin 3.9 3.6 - 5.1 g/dL    Globulin 2.1 1.9 - 3.7 g/dL (calc)   AG Ratio 1.9 1.0 - 2.5 (calc)   Total Bilirubin 0.7 0.2 - 1.2 mg/dL   Alkaline phosphatase (APISO) 63 40 - 115 U/L   AST 10 10 - 35 U/L   ALT 7 (L) 9 - 46 U/L     PHQ2/9: Depression screen [facility] 2/9 06/15/2018 09/09/2017 07/07/2017 11/18/2016 09/23/2016  Decreased Interest 0 0 0 0 0  Down, Depressed, Hopeless 0 0 0 0 0  PHQ - 2 Score 0 0 0 0 0     Fall Risk: Fall Risk  06/15/2018 03/01/2018 01/07/2018 10/06/2017 09/09/2017  Falls in the past year? No No No No No  Number falls in past yr: - - - - -  Injury with Fall? - - - - -  Follow up - - - - -     Functional Status Survey: Is the patient deaf or have difficulty hearing?: Yes Does the patient have difficulty seeing, even when wearing glasses/contacts?: No Does the patient have difficulty concentrating, remembering, or making decisions?: Yes Does the patient have difficulty walking or climbing stairs?: No Does the patient have difficulty dressing or bathing?: No Does the patient have difficulty doing errands alone such as visiting a doctor's office or shopping?: No    Assessment & Plan  1. Abnormal weight loss  - COMPLETE METABOLIC PANEL WITH GFR - CBC with Differential/Platelet - TSH  2. Right upper quadrant pain  - US Abdomen Limited RUQ; Future - KUB  3. Uncontrolled hypertension   - Lipid panel - amLODipine (NORVASC)  2.5 MG tablet; Take 1 tablet (2.5 mg total) by mouth daily.  Dispense: 30 tablet; Refill: 0  4. Chronic kidney disease, stage III (moderate) (HCC)  Recheck labs, on ACE   5. Change in bowel movement  Increase in urine frequency   6. Peripheral vascular disease of lower extremity (HCC)  - Lipid panel

## 2018-06-15 NOTE — Patient Instructions (Signed)
Take new bp medication at night

## 2018-06-16 ENCOUNTER — Telehealth: Payer: Self-pay

## 2018-06-16 LAB — LIPID PANEL
CHOL/HDL RATIO: 4.7 (calc) (ref <5.0)
CHOLESTEROL: 186 mg/dL (ref <200)
HDL: 40 mg/dL — AB (ref >40)
LDL CHOLESTEROL (CALC): 120 mg/dL — AB
Non-HDL Cholesterol (Calc): 146 mg/dL (calc) — ABNORMAL HIGH (ref <130)
Triglycerides: 149 mg/dL (ref <150)

## 2018-06-16 LAB — CBC WITH DIFFERENTIAL/PLATELET
BASOS PCT: 1.4 %
Basophils Absolute: 104 cells/uL (ref 0–200)
Eosinophils Absolute: 252 cells/uL (ref 15–500)
Eosinophils Relative: 3.4 %
HCT: 45.6 % (ref 38.5–50.0)
Hemoglobin: 15.6 g/dL (ref 13.2–17.1)
Lymphs Abs: 2353 cells/uL (ref 850–3900)
MCH: 31 pg (ref 27.0–33.0)
MCHC: 34.2 g/dL (ref 32.0–36.0)
MCV: 90.5 fL (ref 80.0–100.0)
MONOS PCT: 11.1 %
MPV: 11.7 fL (ref 7.5–12.5)
NEUTROS PCT: 52.3 %
Neutro Abs: 3870 cells/uL (ref 1500–7800)
PLATELETS: 257 10*3/uL (ref 140–400)
RBC: 5.04 10*6/uL (ref 4.20–5.80)
RDW: 12.2 % (ref 11.0–15.0)
TOTAL LYMPHOCYTE: 31.8 %
WBC: 7.4 10*3/uL (ref 3.8–10.8)
WBCMIX: 821 {cells}/uL (ref 200–950)

## 2018-06-16 LAB — COMPLETE METABOLIC PANEL WITH GFR
AG Ratio: 2.1 (calc) (ref 1.0–2.5)
ALBUMIN MSPROF: 4.1 g/dL (ref 3.6–5.1)
ALKALINE PHOSPHATASE (APISO): 58 U/L (ref 40–115)
ALT: 9 U/L (ref 9–46)
AST: 12 U/L (ref 10–35)
BILIRUBIN TOTAL: 0.6 mg/dL (ref 0.2–1.2)
BUN / CREAT RATIO: 26 (calc) — AB (ref 6–22)
BUN: 40 mg/dL — ABNORMAL HIGH (ref 7–25)
CO2: 25 mmol/L (ref 20–32)
Calcium: 9.4 mg/dL (ref 8.6–10.3)
Chloride: 107 mmol/L (ref 98–110)
Creat: 1.55 mg/dL — ABNORMAL HIGH (ref 0.70–1.11)
GFR, Est African American: 45 mL/min/{1.73_m2} — ABNORMAL LOW (ref >=60)
GFR, Est Non African American: 39 mL/min/{1.73_m2} — ABNORMAL LOW (ref >=60)
GLOBULIN: 2 g/dL (ref 1.9–3.7)
Glucose, Bld: 99 mg/dL (ref 65–99)
Potassium: 5.1 mmol/L (ref 3.5–5.3)
SODIUM: 138 mmol/L (ref 135–146)
Total Protein: 6.1 g/dL (ref 6.1–8.1)

## 2018-06-16 LAB — TSH: TSH: 2.65 mIU/L (ref 0.40–4.50)

## 2018-06-16 NOTE — Telephone Encounter (Signed)
I tried to contact this patient's daughter to inform her that her dad has been scheduled to have his [place] on 06/17/18 @ 8am at the [facility], but there was no answer. A message was left with this information on it. We also informed them to arrive 15 mins early and not to have anything to eat or drink after midnight.

## 2018-06-16 NOTE — Telephone Encounter (Signed)
I tried to contact this patient's daughter to inform her that her dad has been scheduled to have his [place] on 06/17/18 @ 8am at the [facility], but there was no answer. A message was left with this information on it. We also informed them to arrive 15 mins early and not to have anything to eat or drink after midnight.

## 2018-06-17 ENCOUNTER — Ambulatory Visit: Admission: RE | Admit: 2018-06-17 | Payer: Medicare Other | Source: Ambulatory Visit

## 2018-06-21 ENCOUNTER — Ambulatory Visit: Payer: Medicare Other | Admitting: Family Medicine

## 2018-06-21 ENCOUNTER — Encounter: Payer: Self-pay | Admitting: Family Medicine

## 2018-06-21 VITALS — BP 144/56 | HR 33 | Temp 98.6°F | Resp 14 | Ht 70.0 in | Wt 191.4 lb

## 2018-06-21 DIAGNOSIS — I10 Essential (primary) hypertension: Secondary | ICD-10-CM | POA: Diagnosis not present

## 2018-06-21 DIAGNOSIS — R1013 Epigastric pain: Secondary | ICD-10-CM

## 2018-06-21 DIAGNOSIS — K5909 Other constipation: Secondary | ICD-10-CM | POA: Diagnosis not present

## 2018-06-21 DIAGNOSIS — R1011 Right upper quadrant pain: Secondary | ICD-10-CM | POA: Diagnosis not present

## 2018-06-21 NOTE — Progress Notes (Signed)
Name: Jose Castro   MRN: [id number]    DOB: Jun 22, 1926   Date:06/21/2018       Progress Note  Subjective  Chief Complaint  Chief Complaint  Patient presents with  . Abdominal Pain    patient presents with RUQ that radiates to the middle.     HPI  RUQ pain/Dyspepsia: he states he got a phone call telling him not to have gallbladder US, he is still having indigestion. Daughter came in with him today. His weight is stable. He had KUB that showed constipation. He has been taking miralax and states has been soiling . No fever or chills. Pain now is also hurting on right upper back. He states his skin feels sensitive not as much when he pushes on it.    Patient Active Problem List   Diagnosis Date Noted  . Chronic kidney disease, stage III (moderate) ([place]) 06/15/2018  . Leg pain 05/06/2018  . Lymphedema 05/06/2018  . Coagulopathy ([place]) 07/07/2017  . Lumbar spondylosis 03/10/2017  . Elevated uric acid in blood 11/20/2016  . BPH (benign prostatic hyperplasia) 05/29/2016  . Peripheral vascular disease of lower extremity ([place]) 03/03/2016  . Gallstone 12/31/2015  . 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 Atherosclerosis of aorta ([place]) 11/22/2015  . Carotid artery narrowing 11/22/2015  . Diverticulosis of colon 11/22/2015  . Decreased creatinine clearance 11/22/2015  . Arthritis, degenerative 11/22/2015  . Lactose intolerance 11/22/2015  . Basal cell carcinoma 11/22/2015  . Essential hypertension 06/21/2015  . Hyperlipemia 06/21/2015  . Gastroesophageal reflux disease with esophagitis 06/21/2015  . Calculus of kidney 11/18/2013    Past Surgical History:  Procedure Laterality Date  . APPENDECTOMY    . KIDNEY STONE SURGERY    . KNEE SURGERY    . SPINE SURGERY      Family History  Problem Relation Age of Onset  . Cancer Brother        bladder cancer with mets  . Heart disease Mother   . Aortic aneurysm Mother   . Heart attack Maternal Aunt   .  Heart attack Maternal Uncle     Social History   Socioeconomic History  . Marital status: Widowed    Spouse name: Not on file  . Number of children: 6  . Years of education: Not on file  . Highest education level: Some college, no degree  Occupational History  . Occupation: retired    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  Social Needs  . Financial resource strain: Not hard at all  . Food insecurity:    Worry: Never true    Inability: Never true  . Transportation needs:    Medical: No    Non-medical: No  Tobacco Use  . Smoking status: Former Smoker    Years: 16.00    Types: Cigarettes    Start date: 10/21/1944    Last attempt to quit: 10/21/1960    Years since quitting: 57.7  . Smokeless tobacco: Never Used  Substance and Sexual Activity  . Alcohol use: Yes    Alcohol/week: 1.8 oz    Types: 3 Standard drinks or equivalent per week    Comment: once a week when he goes out to dinner-socially  . Drug use: No  . Sexual activity: Yes    Partners: Female  Lifestyle  . Physical activity:    Days per week: 2 days    Minutes per session: 60 min  . Stress: Not at  all  Relationships  . Social connections:    Talks on phone: Three times a week    Gets together: Twice a week    Attends religious service: More than 4 times per year    Active member of club or organization: Yes    Attends meetings of clubs or organizations: More than 4 times per year    Relationship status: Widowed  . Intimate partner violence:    Fear of current or ex partner: No    Emotionally abused: No    Physically abused: No    Forced sexual activity: No  Other Topics Concern  . Not on file  Social History Narrative   Widow, has a good networks of friends and family      Current Outpatient Medications:  .  amLODipine (NORVASC) 2.5 MG tablet, Take 1 tablet (2.5 mg total) by mouth daily., Disp: 30 tablet, Rfl: 0 .  brimonidine (ALPHAGAN) 0.2 % ophthalmic solution, Place 1 drop into both  eyes 2 (two) times daily. , Disp: , Rfl:  .  Cholecalciferol (VITAMIN D3) 2000 units TABS, Take 1 tablet by mouth daily., Disp: , Rfl:  .  CIALIS 5 MG tablet, Take 5 mg by mouth daily. , Disp: , Rfl:  .  finasteride (PROSCAR) 5 MG tablet, Take 5 mg by mouth daily. , Disp: , Rfl:  .  lisinopril (PRINIVIL,ZESTRIL) 10 MG tablet, TAKE ONE TABLET BY MOUTH EVERY DAY, Disp: 30 tablet, Rfl: 0 .  Omega-3 Fatty Acids (FISH OIL) 1000 MG CAPS, Take 1 capsule by mouth daily., Disp: , Rfl:  .  pantoprazole (PROTONIX) 40 MG tablet, Take 1 tablet (40 mg total) by mouth daily., Disp: 90 tablet, Rfl: 1 .  ranitidine (ZANTAC) 150 MG capsule, Take 150 mg by mouth 2 (two) times daily., Disp: , Rfl:   Allergies  Allergen Reactions  . Penicillins Itching and Swelling  . Gabapentin Hives    GI upset.     ROS  Constitutional: Negative for fever or weight change.  Respiratory: Negative for cough and shortness of breath.   Cardiovascular: Negative for chest pain or palpitations.  Gastrointestinal: Negative for abdominal pain, no bowel changes.  Musculoskeletal: Negative for gait problem or joint swelling.  Skin: Negative for rash.  Neurological: Negative for dizziness or headache.  No other specific complaints in a complete review of systems (except as listed in HPI above).  Objective  Vitals:   06/21/18 1459 06/21/18 1500  BP: (!) 162/74 (!) 144/56  Pulse: (!) 33   Resp: 14   Temp: 98.6 F (37 C)   TempSrc: Oral   SpO2: 96%   Weight: 191 lb 6.4 oz (86.8 kg)   Height: 5\' 10"  (1.778 m)     Body mass index is 27.46 kg/m.  Physical Exam  Constitutional: Patient appears well-developed and well-nourished. Overweight No distress.  HEENT: head atraumatic, normocephalic, pupils equal and reactive to light,  neck supple, throat within normal limits Cardiovascular: Normal rate, regular rhythm and normal heart sounds.  No murmur heard. No BLE edema. Pulmonary/Chest: Effort normal and breath sounds  normal. No respiratory distress. Abdominal: Soft.  There is RUQ  Tenderness, no guarding or rebound . He is sensitive to light touch. No rashes noticed - discussed shingles but not the same dermatome.  Psychiatric: Patient has a normal mood and affect. behavior is normal. Judgment and thought content normal.  Recent Results (from the past 2160 hour(s))  COMPLETE METABOLIC PANEL WITH GFR     Status:  Abnormal   Collection Time: 04/20/18  3:00 PM  Result Value Ref Range   Glucose, Bld 84 65 - 99 mg/dL    Comment: .            Fasting reference interval .    BUN 23 7 - 25 mg/dL   Creat 1.17 (H) 0.70 - 1.11 mg/dL    Comment: For patients >58 years of age, the reference limit for Creatinine is approximately 13% higher for people identified as African-American. .    GFR, Est Non African American 55 (L) > OR = 60 mL/min/1.88m2   GFR, Est African American 64 > OR = 60 mL/min/1.60m2   BUN/Creatinine Ratio 20 6 - 22 (calc)   Sodium 140 135 - 146 mmol/L   Potassium 4.8 3.5 - 5.3 mmol/L   Chloride 106 98 - 110 mmol/L   CO2 27 20 - 32 mmol/L   Calcium 9.3 8.6 - 10.3 mg/dL   Total Protein 6.0 (L) 6.1 - 8.1 g/dL   Albumin 3.9 3.6 - 5.1 g/dL   Globulin 2.1 1.9 - 3.7 g/dL (calc)   AG Ratio 1.9 1.0 - 2.5 (calc)   Total Bilirubin 0.7 0.2 - 1.2 mg/dL   Alkaline phosphatase (APISO) 63 40 - 115 U/L   AST 10 10 - 35 U/L   ALT 7 (L) 9 - 46 U/L  Lipid panel     Status: Abnormal   Collection Time: 06/15/18  4:17 PM  Result Value Ref Range   Cholesterol 186 <200 mg/dL   HDL 40 (L) >40 mg/dL   Triglycerides 149 <150 mg/dL   LDL Cholesterol (Calc) 120 (H) mg/dL (calc)    Comment: Reference range: <100 . Desirable range <100 mg/dL for primary prevention;   <70 mg/dL for patients with CHD or diabetic patients  with > or = 2 CHD risk factors. Marland Kitchen LDL-C is now calculated using the Martin-Hopkins  calculation, which is a validated novel method providing  better accuracy than the Friedewald equation in  the  estimation of LDL-C.  Cresenciano Genre et al. Annamaria Helling. 4259;563(87): 2061-2068  (http://education.[facility].com/faq/FAQ164)    Total CHOL/HDL Ratio 4.7 <5.0 (calc)   Non-HDL Cholesterol (Calc) 146 (H) <130 mg/dL (calc)    Comment: For patients with diabetes plus 1 major ASCVD risk  factor, treating to a non-HDL-C goal of <100 mg/dL  (LDL-C of <70 mg/dL) is considered a therapeutic  option.   COMPLETE METABOLIC PANEL WITH GFR     Status: Abnormal   Collection Time: 06/15/18  4:17 PM  Result Value Ref Range   Glucose, Bld 99 65 - 99 mg/dL    Comment: .            Fasting reference interval .    BUN 40 (H) 7 - 25 mg/dL   Creat 1.55 (H) 0.70 - 1.11 mg/dL    Comment: For patients >49 years of age, the reference limit for Creatinine is approximately 13% higher for people identified as African-American. .    GFR, Est Non African American 39 (L) > OR = 60 mL/min/1.22m2   GFR, Est African American 45 (L) > OR = 60 mL/min/1.74m2   BUN/Creatinine Ratio 26 (H) 6 - 22 (calc)   Sodium 138 135 - 146 mmol/L   Potassium 5.1 3.5 - 5.3 mmol/L   Chloride 107 98 - 110 mmol/L   CO2 25 20 - 32 mmol/L   Calcium 9.4 8.6 - 10.3 mg/dL   Total Protein 6.1 6.1 - 8.1 g/dL  Albumin 4.1 3.6 - 5.1 g/dL   Globulin 2.0 1.9 - 3.7 g/dL (calc)   AG Ratio 2.1 1.0 - 2.5 (calc)   Total Bilirubin 0.6 0.2 - 1.2 mg/dL   Alkaline phosphatase (APISO) 58 40 - 115 U/L   AST 12 10 - 35 U/L   ALT 9 9 - 46 U/L  CBC with Differential/Platelet     Status: None   Collection Time: 06/15/18  4:17 PM  Result Value Ref Range   WBC 7.4 3.8 - 10.8 Thousand/uL   RBC 5.04 4.20 - 5.80 Million/uL   Hemoglobin 15.6 13.2 - 17.1 g/dL   HCT 45.6 38.5 - 50.0 %   MCV 90.5 80.0 - 100.0 fL   MCH 31.0 27.0 - 33.0 pg   MCHC 34.2 32.0 - 36.0 g/dL   RDW 12.2 11.0 - 15.0 %   Platelets 257 140 - 400 Thousand/uL   MPV 11.7 7.5 - 12.5 fL   Neutro Abs 3,870 1,500 - 7,800 cells/uL   Lymphs Abs 2,353 850 - 3,900 cells/uL   WBC mixed  population 821 200 - 950 cells/uL   Eosinophils Absolute 252 15 - 500 cells/uL   Basophils Absolute 104 0 - 200 cells/uL   Neutrophils Relative % 52.3 %   Total Lymphocyte 31.8 %   Monocytes Relative 11.1 %   Eosinophils Relative 3.4 %   Basophils Relative 1.4 %  TSH     Status: None   Collection Time: 06/15/18  4:17 PM  Result Value Ref Range   TSH 2.65 0.40 - 4.50 mIU/L     PHQ2/9: Depression screen [facility] 2/9 06/21/2018 06/15/2018 09/09/2017 07/07/2017 11/18/2016  Decreased Interest 0 0 0 0 0  Down, Depressed, Hopeless 0 0 0 0 0  PHQ - 2 Score 0 0 0 0 0     Fall Risk: Fall Risk  06/21/2018 06/15/2018 03/01/2018 01/07/2018 10/06/2017  Falls in the past year? No No No No No  Number falls in past yr: - - - - -  Injury with Fall? - - - - -  Follow up - - - - -     Functional Status Survey: Is the patient deaf or have difficulty hearing?: Yes Does the patient have difficulty seeing, even when wearing glasses/contacts?: No Does the patient have difficulty concentrating, remembering, or making decisions?: Yes Does the patient have difficulty walking or climbing stairs?: No Does the patient have difficulty dressing or bathing?: No Does the patient have difficulty doing errands alone such as visiting a doctor's office or shopping?: No    Assessment & Plan  1. Right upper quadrant pain  - Ambulatory referral to Gastroenterology Re-schedule RUQ [place]   2. Constipation, chronic  - Ambulatory referral to Gastroenterology  3. Dyspepsia  - Ambulatory referral to Gastroenterology  4. Uncontrolled hypertension  Daughter is very concerned about him taking Norvasc because his DBP has dropped. Today bp is still elevated, we can stop norvasc, monitor and consider going up on lisinopril . We will monitor for now

## 2018-06-24 ENCOUNTER — Encounter: Payer: Self-pay | Admitting: Gastroenterology

## 2018-06-24 ENCOUNTER — Ambulatory Visit (INDEPENDENT_AMBULATORY_CARE_PROVIDER_SITE_OTHER): Payer: Medicare Other | Admitting: Gastroenterology

## 2018-06-24 VITALS — BP 163/65 | HR 43 | Ht 70.0 in | Wt 191.6 lb

## 2018-06-24 DIAGNOSIS — R140 Abdominal distension (gaseous): Secondary | ICD-10-CM

## 2018-06-24 DIAGNOSIS — K5904 Chronic idiopathic constipation: Secondary | ICD-10-CM

## 2018-06-24 NOTE — Progress Notes (Signed)
Cephas Darby, MD [address]  [place]  [place], [place] [address]  Main: [phone]  Fax: [phone]    Gastroenterology Consultation  Referring Provider:     Steele Sizer, MD Primary Care Physician:  Steele Sizer, MD Primary Gastroenterologist:  Dr. Cephas Darby Reason for Consultation: abdominal bloating, chronic constipation        HPI:   Jose Castro is a 84 y.o. male referred by Dr. Steele Sizer, MD  for consultation & management of chronic abdominal bloating, right upper quadrant discomfort. History of constipation for which he has been taking MiraLAX and prune juice. This has resulted in loose watery bowel movements and sometimes leakage. He consumes red meat, sodas on a regular basis. He had abdominal x-ray done a few days ago which revealed moderate stool burden in his entire colon. He had a CT A/P with contrast in 01/2017 which was unremarkable. He is currently on Protonix and Zantac due to regurgitation. He denies having heartburn, dysphagia, regurgitation. These symptoms are currently under control. He does have history of asymptomatic bradycardia and he was seen by cardiologist, Dr. Clayborn Bigness. His weight has been overall stable. Patient lost his wife a few months ago and limited his physical activity. He is accompanied by his daughter today  NSAIDs: none  Antiplts/Anticoagulants/Anti thrombotics: none  GI Procedures: colonoscopy in 2009, reportedly normal  Past Medical History:  Diagnosis Date  . BPH (benign prostatic hyperplasia)   . Chronic kidney disease    had a kidney stone which per family was a cyst that was removed  . GERD (gastroesophageal reflux disease)   . Hyperlipidemia   . Hypertension   . Splenic vein thrombosis   . Thrombosis 12/2015   mural  area and no notation of heart attack    Past Surgical History:  Procedure Laterality Date  . APPENDECTOMY    . KIDNEY STONE SURGERY    . KNEE SURGERY    . SPINE SURGERY       Current Outpatient Medications:  .  brimonidine (ALPHAGAN) 0.2 % ophthalmic solution, Place 1 drop into both eyes 2 (two) times daily. , Disp: , Rfl:  .  Cholecalciferol (VITAMIN D3) 2000 units TABS, Take 1 tablet by mouth daily., Disp: , Rfl:  .  CIALIS 5 MG tablet, Take 5 mg by mouth daily. , Disp: , Rfl:  .  finasteride (PROSCAR) 5 MG tablet, Take 5 mg by mouth daily. , Disp: , Rfl:  .  lisinopril (PRINIVIL,ZESTRIL) 10 MG tablet, TAKE ONE TABLET BY MOUTH EVERY DAY, Disp: 30 tablet, Rfl: 0 .  Omega-3 Fatty Acids (FISH OIL) 1000 MG CAPS, Take 1 capsule by mouth daily., Disp: , Rfl:  .  pantoprazole (PROTONIX) 40 MG tablet, Take 1 tablet (40 mg total) by mouth daily., Disp: 90 tablet, Rfl: 1 .  ranitidine (ZANTAC) 150 MG capsule, Take 150 mg by mouth 2 (two) times daily., Disp: , Rfl:    Family History  Problem Relation Age of Onset  . Cancer Brother        bladder cancer with mets  . Heart disease Mother   . Aortic aneurysm Mother   . Heart attack Maternal Aunt   . Heart attack Maternal Uncle      Social History   Tobacco Use  . Smoking status: Former Smoker    Years: 16.00    Types: Cigarettes    Start date: 10/21/1944    Last attempt to quit: 10/21/1960    Years  since quitting: 57.7  . Smokeless tobacco: Never Used  Substance Use Topics  . Alcohol use: Yes    Alcohol/week: 1.8 oz    Types: 3 Standard drinks or equivalent per week    Comment: once a week when he goes out to dinner-socially  . Drug use: No    Allergies as of 06/24/2018 - Review Complete 06/24/2018  Allergen Reaction Noted  . Penicillins Itching and Swelling 10/22/2015  . Gabapentin Hives 11/22/2015    Review of Systems:    All systems reviewed and negative except where noted in HPI.   Physical Exam:  BP (!) 163/65   Pulse (!) 43   Ht 5\' 10"  (1.778 m)   Wt 191 lb 9.6 oz (86.9 kg)   BMI 27.49 kg/m  No LMP for male patient.  General:   Alert,  Well-developed, well-nourished, pleasant and  cooperative in NAD Head:  Normocephalic and atraumatic. Eyes:  Sclera clear, no icterus.   Conjunctiva pink. Ears:  Normal auditory acuity. Nose:  No deformity, discharge, or lesions. Mouth:  No deformity or lesions,oropharynx pink & moist. Neck:  Supple; no masses or thyromegaly. Lungs:  Respirations even and unlabored.  Clear throughout to auscultation.   No wheezes, crackles, or rhonchi. No acute distress. Heart:  Regular rate and rhythm; no murmurs, clicks, rubs, or gallops. Abdomen:  Normal bowel sounds. Soft, non-tender and non-distended without masses, hepatosplenomegaly or hernias noted.  No guarding or rebound tenderness.   Rectal: Not performed Msk:  Symmetrical without gross deformities. Good, equal movement & strength bilaterally. Pulses:  Normal pulses noted. Extremities:  No clubbing or edema.  No cyanosis. Neurologic:  Alert and oriented x3;  grossly normal neurologically. Skin:  Intact without significant lesions or rashes. No jaundice. Lymph Nodes:  No significant cervical adenopathy. Psych:  Alert and cooperative. Normal mood and affect.  Imaging Studies: Reviewed  Assessment and Plan:   Jose Castro is a 84 y.o. Caucasian male with hypertension, seen in consultation for abdominal bloating and irregular bowel habits. X-ray abdomen revealed moderate stool burden.   - discussed with him about high-fiber diet - Avoid gas producing foods - Increase physical activity - Trial of IB guard - Continue either MiraLAX or prune juice - also, discussed with him in about surveillance colonoscopy as he is overall healthy. Patient prefers to wait few weeks to see if his bloating resolves. His daughter also said that they would like to think about it and prefers to have a noninvasive screening test such as FOBT. He can have FOBT done annually   Follow up in 4 weeks   Cephas Darby, MD

## 2018-06-24 NOTE — Patient Instructions (Signed)
High-Fiber Diet  Fiber, also called dietary fiber, is a type of carbohydrate found in fruits, vegetables, whole grains, and beans. A high-fiber diet can have many health benefits. Your health care provider may recommend a high-fiber diet to help:  · Prevent constipation. Fiber can make your bowel movements more regular.  · Lower your cholesterol.  · Relieve hemorrhoids, uncomplicated diverticulosis, or irritable bowel syndrome.  · Prevent overeating as part of a weight-loss plan.  · Prevent heart disease, type 2 diabetes, and certain cancers.    What is my plan?  The recommended daily intake of fiber includes:  · 38 grams for men under age 50.  · 30 grams for men over age 50.  · 25 grams for women under age 50.  · 21 grams for women over age 50.    You can get the recommended daily intake of dietary fiber by eating a variety of fruits, vegetables, grains, and beans. Your health care provider may also recommend a fiber supplement if it is not possible to get enough fiber through your diet.  What do I need to know about a high-fiber diet?  · Fiber supplements have not been widely studied for their effectiveness, so it is better to get fiber through food sources.  · Always check the fiber content on the nutrition facts label of any prepackaged food. Look for foods that contain at least 5 grams of fiber per serving.  · Ask your dietitian if you have questions about specific foods that are related to your condition, especially if those foods are not listed in the following section.  · Increase your daily fiber consumption gradually. Increasing your intake of dietary fiber too quickly may cause bloating, cramping, or gas.  · Drink plenty of water. Water helps you to digest fiber.  What foods can I eat?  Grains  Whole-grain breads. Multigrain cereal. Oats and oatmeal. Brown rice. Barley. Bulgur wheat. Millet. Bran muffins. Popcorn. Rye wafer crackers.  Vegetables   Sweet potatoes. Spinach. Kale. Artichokes. Cabbage. Broccoli. Green peas. Carrots. Squash.  Fruits  Berries. Pears. Apples. Oranges. Avocados. Prunes and raisins. Dried figs.  Meats and Other Protein Sources  Navy, kidney, pinto, and soy beans. Split peas. Lentils. Nuts and seeds.  Dairy  Fiber-fortified yogurt.  Beverages  Fiber-fortified soy milk. Fiber-fortified orange juice.  Other  Fiber bars.  The items listed above may not be a complete list of recommended foods or beverages. Contact your dietitian for more options.  What foods are not recommended?  Grains  White bread. Pasta made with refined flour. White rice.  Vegetables  Fried potatoes. Canned vegetables. Well-cooked vegetables.  Fruits  Fruit juice. Cooked, strained fruit.  Meats and Other Protein Sources  Fatty cuts of meat. Fried poultry or fried fish.  Dairy  Milk. Yogurt. Cream cheese. Sour cream.  Beverages  Soft drinks.  Other  Cakes and pastries. Butter and oils.  The items listed above may not be a complete list of foods and beverages to avoid. Contact your dietitian for more information.  What are some tips for including high-fiber foods in my diet?  · Eat a wide variety of high-fiber foods.  · Make sure that half of all grains consumed each day are whole grains.  · Replace breads and cereals made from refined flour or white flour with whole-grain breads and cereals.  · Replace white rice with brown rice, bulgur wheat, or millet.  · Start the day with a breakfast that is high in fiber,   such as a cereal that contains at least 5 grams of fiber per serving.  · Use beans in place of meat in soups, salads, or pasta.  · Eat high-fiber snacks, such as berries, raw vegetables, nuts, or popcorn.  This information is not intended to replace advice given to you by your health care provider. Make sure you discuss any questions you have with your health care provider.  Document Released: 11/17/2005 Document Revised: 04/24/2016 Document Reviewed: 05/02/2014   [employer]vier Interactive Patient Education © 2018 [employer]

## 2018-06-25 ENCOUNTER — Other Ambulatory Visit: Payer: Self-pay | Admitting: Family Medicine

## 2018-06-25 ENCOUNTER — Emergency Department
Admission: EM | Admit: 2018-06-25 | Discharge: 2018-06-25 | Disposition: A | Discharge status: Home / Self Care | Payer: Medicare Other | Attending: Emergency Medicine | Admitting: Emergency Medicine

## 2018-06-25 ENCOUNTER — Other Ambulatory Visit: Payer: Self-pay

## 2018-06-25 ENCOUNTER — Ambulatory Visit
Admission: RE | Admit: 2018-06-25 | Discharge: 2018-06-25 | Disposition: A | Discharge status: Home / Self Care | Payer: Medicare Other | Source: Ambulatory Visit | Attending: Family Medicine | Admitting: Family Medicine

## 2018-06-25 ENCOUNTER — Emergency Department: Payer: Medicare Other

## 2018-06-25 ENCOUNTER — Telehealth: Payer: Self-pay

## 2018-06-25 DIAGNOSIS — R1011 Right upper quadrant pain: Secondary | ICD-10-CM

## 2018-06-25 DIAGNOSIS — Z87891 Personal history of nicotine dependence: Secondary | ICD-10-CM | POA: Diagnosis not present

## 2018-06-25 DIAGNOSIS — R001 Bradycardia, unspecified: Secondary | ICD-10-CM | POA: Diagnosis present

## 2018-06-25 DIAGNOSIS — Z79899 Other long term (current) drug therapy: Secondary | ICD-10-CM | POA: Insufficient documentation

## 2018-06-25 DIAGNOSIS — I129 Hypertensive chronic kidney disease with stage 1 through stage 4 chronic kidney disease, or unspecified chronic kidney disease: Secondary | ICD-10-CM | POA: Diagnosis not present

## 2018-06-25 DIAGNOSIS — I442 Atrioventricular block, complete: Secondary | ICD-10-CM | POA: Insufficient documentation

## 2018-06-25 DIAGNOSIS — N183 Chronic kidney disease, stage 3 (moderate): Secondary | ICD-10-CM | POA: Diagnosis not present

## 2018-06-25 DIAGNOSIS — K8020 Calculus of gallbladder without cholecystitis without obstruction: Secondary | ICD-10-CM | POA: Insufficient documentation

## 2018-06-25 DIAGNOSIS — N261 Atrophy of kidney (terminal): Secondary | ICD-10-CM

## 2018-06-25 DIAGNOSIS — I739 Peripheral vascular disease, unspecified: Secondary | ICD-10-CM

## 2018-06-25 LAB — COMPREHENSIVE METABOLIC PANEL
ALBUMIN: 4 g/dL (ref 3.5–5.0)
ALT: 12 U/L (ref 0–44)
AST: 17 U/L (ref 15–41)
Alkaline Phosphatase: 57 U/L (ref 38–126)
Anion gap: 7 (ref 5–15)
BILIRUBIN TOTAL: 1 mg/dL (ref 0.3–1.2)
BUN: 36 mg/dL — ABNORMAL HIGH (ref 8–23)
CO2: 26 mmol/L (ref 22–32)
Calcium: 9.1 mg/dL (ref 8.9–10.3)
Chloride: 107 mmol/L (ref 98–111)
Creatinine, Ser: 1.36 mg/dL — ABNORMAL HIGH (ref 0.61–1.24)
GFR calc Af Amer: 52 mL/min — ABNORMAL LOW (ref >60)
GFR, EST NON AFRICAN AMERICAN: 44 mL/min — AB (ref >60)
Glucose, Bld: 99 mg/dL (ref 70–99)
POTASSIUM: 4.8 mmol/L (ref 3.5–5.1)
Sodium: 140 mmol/L (ref 135–145)
TOTAL PROTEIN: 6.9 g/dL (ref 6.5–8.1)

## 2018-06-25 LAB — TROPONIN I

## 2018-06-25 LAB — CBC WITH DIFFERENTIAL/PLATELET
BASOS ABS: 0.1 10*3/uL (ref 0–0.1)
BASOS PCT: 1 %
Eosinophils Absolute: 0.3 10*3/uL (ref 0–0.7)
Eosinophils Relative: 3 %
HEMATOCRIT: 45.2 % (ref 40.0–52.0)
HEMOGLOBIN: 15.7 g/dL (ref 13.0–18.0)
Lymphocytes Relative: 29 %
Lymphs Abs: 2.5 10*3/uL (ref 1.0–3.6)
MCH: 32 pg (ref 26.0–34.0)
MCHC: 34.6 g/dL (ref 32.0–36.0)
MCV: 92.3 fL (ref 80.0–100.0)
Monocytes Absolute: 1 10*3/uL (ref 0.2–1.0)
Monocytes Relative: 11 %
NEUTROS ABS: 4.8 10*3/uL (ref 1.4–6.5)
NEUTROS PCT: 56 %
Platelets: 241 10*3/uL (ref 150–440)
RBC: 4.9 MIL/uL (ref 4.40–5.90)
RDW: 12.9 % (ref 11.5–14.5)
WBC: 8.6 10*3/uL (ref 3.8–10.6)

## 2018-06-25 LAB — BRAIN NATRIURETIC PEPTIDE: B Natriuretic Peptide: 427 pg/mL — ABNORMAL HIGH (ref 0.0–100.0)

## 2018-06-25 NOTE — ED Provider Notes (Signed)
[facility] Emergency Department Provider Note  ____________________________________________   First MD Initiated Contact with Patient 06/25/18 1714     (approximate)  I have reviewed the triage vital signs and the nursing notes.   HISTORY  Chief Complaint Bradycardia   HPI Jose Castro is a 84 y.o. male who self presents to the emergency department with a heart rate in the 30s.  The patient's granddaughter is a Marine scientist and she checked at home yesterday and noted it was in the 34s although came up to the 54s when ambulating.  She checked him once again today and as it was in the 30s they called the primary care physician who advised to come to the emergency department for further evaluation.  He has seen Dr. Clayborn Bigness once in the past just "to get checked" and has no known cardiac disease.  He takes no nodal blocking agents.  He actually feels asymptomatic.  Denies chest pain or shortness of breath.  He denies abdominal pain nausea or vomiting.  His only request is that he can go home so he can "drink a [employer]".  His symptoms are mild.  Nothing seems to make them better or worse.  They are nonradiating.   Past Medical History:  Diagnosis Date  . BPH (benign prostatic hyperplasia)   . Chronic kidney disease    had a kidney stone which per family was a cyst that was removed  . GERD (gastroesophageal reflux disease)   . Hyperlipidemia   . Hypertension   . Splenic vein thrombosis   . Thrombosis 12/2015   mural  area and no notation of heart attack    Patient Active Problem List   Diagnosis Date Noted  . Chronic kidney disease, stage III (moderate) ([place]) 06/15/2018  . Leg pain 05/06/2018  . Lymphedema 05/06/2018  . Coagulopathy ([place]) 07/07/2017  . Lumbar spondylosis 03/10/2017  . Elevated uric acid in blood 11/20/2016  . BPH (benign prostatic hyperplasia) 05/29/2016  . Peripheral vascular disease of lower extremity ([place]) 03/03/2016  . Gallstone  12/31/2015  . 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 Atherosclerosis of aorta ([place]) 11/22/2015  . Carotid artery narrowing 11/22/2015  . Diverticulosis of colon 11/22/2015  . Decreased creatinine clearance 11/22/2015  . Arthritis, degenerative 11/22/2015  . Lactose intolerance 11/22/2015  . Basal cell carcinoma 11/22/2015  . Essential hypertension 06/21/2015  . Hyperlipemia 06/21/2015  . Gastroesophageal reflux disease with esophagitis 06/21/2015  . Calculus of kidney 11/18/2013    Past Surgical History:  Procedure Laterality Date  . APPENDECTOMY    . KIDNEY STONE SURGERY    . KNEE SURGERY    . SPINE SURGERY      Prior to Admission medications   Medication Sig Start Date End Date Taking? Authorizing Provider  brimonidine (ALPHAGAN) 0.2 % ophthalmic solution Place 1 drop into both eyes 2 (two) times daily.  06/11/15   [provider]  Cholecalciferol (VITAMIN D3) 2000 units TABS Take 1 tablet by mouth daily.    [provider]  CIALIS 5 MG tablet Take 5 mg by mouth daily.  06/11/15   [provider]  finasteride (PROSCAR) 5 MG tablet Take 5 mg by mouth daily.  06/11/15   [provider]  lisinopril (PRINIVIL,ZESTRIL) 10 MG tablet TAKE ONE TABLET BY MOUTH EVERY DAY 05/27/18   Steele Sizer, MD  Omega-3 Fatty Acids (FISH OIL) 1000 MG CAPS Take 1 capsule by mouth daily.    [provider]  pantoprazole (PROTONIX) 40 MG tablet Take 1 tablet (40 mg total) by mouth daily. 01/07/18   Steele Sizer, MD  ranitidine (ZANTAC) 150 MG capsule Take 150 mg by mouth 2 (two) times daily.    [provider]    Allergies Penicillins and Gabapentin  Family History  Problem Relation Age of Onset  . Cancer Brother        bladder cancer with mets  . Heart disease Mother   . Aortic aneurysm Mother   . Heart attack Maternal Aunt   . Heart attack Maternal Uncle     Social History Social History   Tobacco Use  .  Smoking status: Former Smoker    Years: 16.00    Types: Cigarettes    Start date: 10/21/1944    Last attempt to quit: 10/21/1960    Years since quitting: 57.7  . Smokeless tobacco: Never Used  Substance Use Topics  . Alcohol use: Yes    Alcohol/week: 1.8 oz    Types: 3 Standard drinks or equivalent per week    Comment: once a week when he goes out to dinner-socially  . Drug use: No    Review of Systems Constitutional: No fever/chills Eyes: No visual changes. ENT: No sore throat. Cardiovascular: Denies chest pain. Respiratory: Denies shortness of breath. Gastrointestinal: No abdominal pain.  No nausea, no vomiting.  No diarrhea.  No constipation. Genitourinary: Negative for dysuria. Musculoskeletal: Negative for back pain. Skin: Negative for rash. Neurological: Negative for headaches, focal weakness or numbness.   ____________________________________________   PHYSICAL EXAM:  VITAL SIGNS: ED Triage Vitals  Enc Vitals Group     BP 06/25/18 1655 (!) 216/60     Pulse Rate 06/25/18 1655 (!) 36     Resp 06/25/18 1655 16     Temp 06/25/18 1655 98.6 F (37 C)     Temp Source 06/25/18 1655 Oral     SpO2 06/25/18 1655 96 %     Weight 06/25/18 1651 191 lb (86.6 kg)     Height 06/25/18 1651 5\' 10"  (1.778 m)     Head Circumference --      Peak Flow --      Pain Score 06/25/18 1649 5     Pain Loc --      Pain Edu? --      Excl. in [place]? --     Constitutional: Alert and oriented x4 joking laughing nontoxic no diaphoresis Eyes: PERRL EOMI. Head: Atraumatic. Nose: No congestion/rhinnorhea. Mouth/Throat: No trismus Neck: No stridor.   Cardiovascular: Bradycardic rate, regular rhythm. Grossly normal heart sounds.  Good peripheral circulation. Respiratory: Normal respiratory effort.  No retractions. Lungs CTAB and moving good air Gastrointestinal: Soft nontender Musculoskeletal: No lower extremity edema   Neurologic:  Normal speech and language. No gross focal neurologic  deficits are appreciated. Skin:  Skin is warm, dry and intact. No rash noted. Psychiatric: Mood and affect are normal. Speech and behavior are normal.    ____________________________________________   DIFFERENTIAL includes but not limited to  Complete heart block, hyperkalemia, acute coronary syndrome, metabolic derangement ____________________________________________   LABS (all labs ordered are listed, but only abnormal results are displayed)  Labs Reviewed  COMPREHENSIVE METABOLIC PANEL - Abnormal; Notable for the following components:      Result Value   BUN 36 (*)    Creatinine, Ser 1.36 (*)    GFR calc non Af Amer 44 (*)    GFR calc Af Amer 52 (*)  All other components within normal limits  BRAIN NATRIURETIC PEPTIDE - Abnormal; Notable for the following components:   B Natriuretic Peptide 427.0 (*)    All other components within normal limits  TROPONIN I  CBC WITH DIFFERENTIAL/PLATELET    Lab work reviewed by me with slightly increased BNP but no evidence of hyperkalemia or acute ischemia __________________________________________  EKG  ED ECG REPORT I, Darel Hong, the attending physician, personally viewed and interpreted this ECG.  Date: 06/25/2018 EKG Time: 1706 Rate: 41 Rhythm: Complete heart block QRS Axis: normal Intervals: normal ST/T Wave abnormalities: normal Narrative Interpretation: no evidence of acute ischemia  ED ECG REPORT I, Darel Hong, the attending physician, personally viewed and interpreted this ECG.  Date: 06/25/2018 EKG Time: 1726 Rate: 29 Rhythm: Complete heart block QRS Axis: normal Intervals: normal ST/T Wave abnormalities: normal Narrative Interpretation: no evidence of acute ischemia   ____________________________________________  RADIOLOGY  Chest x-ray reviewed by me with no acute disease ____________________________________________   PROCEDURES  Procedure(s) performed: no  .Critical Care Performed  by: Darel Hong, MD Authorized by: Darel Hong, MD   Critical care provider statement:    Critical care time (minutes):  45   Critical care was necessary to treat or prevent imminent or life-threatening deterioration of the following conditions:  Cardiac failure   Critical care was time spent personally by me on the following activities:  Discussions with consultants, evaluation of patient's response to treatment, examination of patient, ordering and performing treatments and interventions, ordering and review of laboratory studies, ordering and review of radiographic studies, pulse oximetry, re-evaluation of patient's condition, obtaining history from patient or surrogate and review of old charts    Critical Care performed: This  ____________________________________________   INITIAL IMPRESSION / ASSESSMENT AND PLAN / ED COURSE  Pertinent labs & imaging results that were available during my care of the patient were reviewed by me and considered in my medical decision making (see chart for details).   As part of my medical decision making, I reviewed the following data within the [place] History obtained from family if available, nursing notes, old chart and ekg, as well as notes from prior ED visits.  The patient arrives with a heart rate in the low 30s although normal blood pressure and is actually asymptomatic.  Differential includes hyperkalemia, acute coronary syndrome, or nodal blockade.  We have placed pacer pads on him and establish to large-bore IV access.  A second EKG performed 20 minutes after the first with no dynamic changes.  Lab work is pending.     ----------------------------------------- 5:48 PM on 06/25/2018 -----------------------------------------  I spoke with Dr. Ubaldo Glassing who indicated that if the patient's potassium and troponin are negative he is actually stable for outpatient management to plan a pacemaker as an  outpatient.  Fortunately the patient feels fine and his lab work is reassuring.  I discussed with the family including the registered nurse granddaughter at bedside and together we all agreed the patient is medically stable to go home and this is what he prefers.  No indication for emergency pacemaker at this point.  He is able to get up and ambulate without difficulty. ____________________________________________   FINAL CLINICAL IMPRESSION(S) / ED DIAGNOSES  Final diagnoses:  Complete heart block ([place])      NEW MEDICATIONS STARTED DURING THIS VISIT:  Discharge Medication List as of 06/25/2018  6:27 PM       Note:  This document was prepared using Dragon voice recognition software  and may include unintentional dictation errors.     Darel Hong, MD 06/27/18 1256

## 2018-06-25 NOTE — ED Triage Notes (Signed)
Pt was seen at the GI dr yesterday and was noted to have a low heart rate, when pt's rate was re-checked again today his rate was 35. Denies any fatigue, or weakness having been light headed, pt is being followed for constipation with GI, no new changes to his medications

## 2018-06-25 NOTE — Telephone Encounter (Signed)
Heart rate of 35 is an emergency, he needs to go to [facility] for evaluation today

## 2018-06-25 NOTE — Telephone Encounter (Signed)
I spoke with this patient's daughter and strongly encouraged her to take him to the nearest ER, but she stated that hs HR came up to 47 after walking around. I then transferred the call to Dr. Ancil Boozer for a consultation

## 2018-06-25 NOTE — Telephone Encounter (Signed)
Spoke to his daughter [name] and explained that he may have sick sinus. He needs to be evaluated tonight. We also called Dr. Etta Quill office by they were closed.

## 2018-06-25 NOTE — ED Notes (Signed)
Pt denies complaints at this time. Sent her for bradycardia. Denies chest pain, shortness of breath or dizziness. Educated pt and family on signs and symptoms to monitor for. Verbalized understanding.

## 2018-06-25 NOTE — Discharge Instructions (Signed)
Please make an appointment to follow-up with your cardiologist this coming Monday or Tuesday for reevaluation and to discuss a pacemaker in the future.  Return immediately to the emergency department sooner for any new or worsening symptoms such as if you pass out, feel lightheaded, or for any other issues whatsoever.  It was a pleasure to take care of you today, and thank you for coming to our emergency department.  If you have any questions or concerns before leaving please ask the nurse to grab me and I'm more than happy to go through your aftercare instructions again.  If you were prescribed any opioid pain medication today such as Norco, Vicodin, Percocet, morphine, hydrocodone, or oxycodone please make sure you do not drive when you are taking this medication as it can alter your ability to drive safely.  If you have any concerns once you are home that you are not improving or are in fact getting worse before you can make it to your follow-up appointment, please do not hesitate to call 911 and come back for further evaluation.  Darel Hong, MD  Results for orders placed or performed during the hospital encounter of 06/25/18  Comprehensive metabolic panel  Result Value Ref Range   Sodium 140 135 - 145 mmol/L   Potassium 4.8 3.5 - 5.1 mmol/L   Chloride 107 98 - 111 mmol/L   CO2 26 22 - 32 mmol/L   Glucose, Bld 99 70 - 99 mg/dL   BUN 36 (H) 8 - 23 mg/dL   Creatinine, Ser 1.36 (H) 0.61 - 1.24 mg/dL   Calcium 9.1 8.9 - 10.3 mg/dL   Total Protein 6.9 6.5 - 8.1 g/dL   Albumin 4.0 3.5 - 5.0 g/dL   AST 17 15 - 41 U/L   ALT 12 0 - 44 U/L   Alkaline Phosphatase 57 38 - 126 U/L   Total Bilirubin 1.0 0.3 - 1.2 mg/dL   GFR calc non Af Amer 44 (L) >60 mL/min   GFR calc Af Amer 52 (L) >60 mL/min   Anion gap 7 5 - 15  Troponin I  Result Value Ref Range   Troponin I <0.03 <0.03 ng/mL  CBC with Differential  Result Value Ref Range   WBC 8.6 3.8 - 10.6 K/uL   RBC 4.90 4.40 - 5.90 MIL/uL   Hemoglobin 15.7 13.0 - 18.0 g/dL   HCT 45.2 40.0 - 52.0 %   MCV 92.3 80.0 - 100.0 fL   MCH 32.0 26.0 - 34.0 pg   MCHC 34.6 32.0 - 36.0 g/dL   RDW 12.9 11.5 - 14.5 %   Platelets 241 150 - 440 K/uL   Neutrophils Relative % 56 %   Neutro Abs 4.8 1.4 - 6.5 K/uL   Lymphocytes Relative 29 %   Lymphs Abs 2.5 1.0 - 3.6 K/uL   Monocytes Relative 11 %   Monocytes Absolute 1.0 0.2 - 1.0 K/uL   Eosinophils Relative 3 %   Eosinophils Absolute 0.3 0 - 0.7 K/uL   Basophils Relative 1 %   Basophils Absolute 0.1 0 - 0.1 K/uL  Brain natriuretic peptide  Result Value Ref Range   B Natriuretic Peptide 427.0 (H) 0.0 - 100.0 pg/mL   Dg Abd 1 View  Result Date: 06/16/2018 CLINICAL DATA:  Pt states tenderness right side with constipation and 10 lb weight loss in 3 months. History of appendectomy, kidney stones. shielded EXAM: ABDOMEN - 1 VIEW COMPARISON:  01/29/2017 CT. FINDINGS: Two supine views. Gas and  stool throughout the colon. Moderate amount of ascending colonic stool. No gaseous distention of bowel loops. No abnormal abdominal calcifications. No appendicolith. Right pelvic calcification is likely vascular. IMPRESSION: No acute findings.  Possible constipation. Electronically Signed   By: Abigail Miyamoto M.D.   On: 06/16/2018 09:32   Dg Chest Port 1 View  Result Date: 06/25/2018 CLINICAL DATA:  Chest pain, atrial fibrillation. EXAM: PORTABLE CHEST 1 VIEW COMPARISON:  Radiographs of November 27, 2015. FINDINGS: The heart size and mediastinal contours are within normal limits. Both lungs are clear. No pneumothorax or pleural effusion is noted. Atherosclerosis of thoracic aorta is noted. The visualized skeletal structures are unremarkable. IMPRESSION: No acute cardiopulmonary abnormality seen. Aortic Atherosclerosis (ICD10-I70.0). Electronically Signed   By: Marijo Conception, M.D.   On: 06/25/2018 17:56   US Abdomen Limited Ruq  Result Date: 06/25/2018 CLINICAL DATA:  Right upper quadrant abdominal pain for  the past 3 days. EXAM: ULTRASOUND ABDOMEN LIMITED RIGHT UPPER QUADRANT COMPARISON:  CT abdomen pelvis-01/30/2015 FINDINGS: Gallbladder: There is a punctate (approximately 6 mm) echogenic partially shadowing stone within neck of the gallbladder with several adjacent less well-defined echogenic stones and biliary sludge (image 10 and 11). Gallbladder is otherwise normal in appearance. No gallbladder wall thickening or pericholecystic fluid. Negative sonographic Murphy's sign. Common bile duct: Diameter: Normal in size measuring 5.4 mm in diameter Liver: Normal sonographic appearance of the liver no discrete hepatic lesions. No intrahepatic biliary duct dilatation. No ascites. Portal vein is patent on color Doppler imaging with normal direction of blood flow towards the liver. Incidentally noted apparent cortical atrophy of the imaged portions of the right kidney (image 41), incompletely evaluated. IMPRESSION: 1. Cholelithiasis without evidence of cholecystitis. 2. Incidentally noted apparent cortical atrophy of the imaged portions of the right kidney, incompletely evaluated. Clinical correlation is advised. Electronically Signed   By: Sandi Mariscal M.D.   On: 06/25/2018 08:28

## 2018-06-25 NOTE — Telephone Encounter (Signed)
Copied from [place] [id number]. Topic: General - Other >> Jun 25, 2018  1:42 PM Keene Breath wrote: Reason for CRM: Patient's daughter called to get advice because she checked her dad's BP (170/68) and HR was 35.  She is concerned that the HR was too low.  Daughter wanted to know if patient should see a cardiologist or make an appointment with the doctor.  Please advise.  CB# [phone].   Please advise

## 2018-06-29 ENCOUNTER — Ambulatory Visit: Payer: Medicare Other | Admitting: Nurse Practitioner

## 2018-06-29 ENCOUNTER — Encounter: Payer: Self-pay | Admitting: Nurse Practitioner

## 2018-06-29 ENCOUNTER — Telehealth: Payer: Self-pay

## 2018-06-29 VITALS — BP 150/80 | HR 34 | Temp 97.9°F | Resp 16 | Ht 70.0 in | Wt 193.1 lb

## 2018-06-29 DIAGNOSIS — R935 Abnormal findings on diagnostic imaging of other abdominal regions, including retroperitoneum: Secondary | ICD-10-CM | POA: Diagnosis not present

## 2018-06-29 DIAGNOSIS — G319 Degenerative disease of nervous system, unspecified: Secondary | ICD-10-CM

## 2018-06-29 DIAGNOSIS — R1032 Left lower quadrant pain: Secondary | ICD-10-CM | POA: Diagnosis not present

## 2018-06-29 DIAGNOSIS — IMO0001 Reserved for inherently not codable concepts without codable children: Secondary | ICD-10-CM

## 2018-06-29 DIAGNOSIS — R109 Unspecified abdominal pain: Secondary | ICD-10-CM

## 2018-06-29 DIAGNOSIS — R808 Other proteinuria: Secondary | ICD-10-CM

## 2018-06-29 LAB — POCT URINALYSIS DIPSTICK
Appearance: NORMAL
Bilirubin, UA: NEGATIVE
GLUCOSE UA: NEGATIVE
KETONES UA: NEGATIVE
Leukocytes, UA: NEGATIVE
Nitrite, UA: NEGATIVE
PH UA: 5 (ref 5.0–8.0)
Protein, UA: POSITIVE — AB
RBC UA: NEGATIVE
Spec Grav, UA: 1.02 (ref 1.010–1.025)
UROBILINOGEN UA: 0.2 U/dL

## 2018-06-29 NOTE — Patient Instructions (Addendum)
-   Please keep follow-up with cardiology and specialist for pacemaker  - Urine test was negative for infection but shows, protein in urine which may indicate issues with kidneys.  - You still need to get CT scan to evaluate kidneys further- switching it from just a renal study to a CT abdomen to look at kidney and other abdominal organs. Please continue to drink plenty of water, and avoid NSAIDs for pain like ibuprofen, aleve, advil, or naproxen.   - Can take tylenol (no more than 3,000mg  in 24 hours) alternate between ice and heat and use lidocaine patch OTC for pain. If your pain is worsening, you start having fevers and chills, feeling lightheaded or dizzy, have bowel or bladder incontinence please go to the ER to be evaluated further.

## 2018-06-29 NOTE — Telephone Encounter (Signed)
He is here today to see Jose Castro with his granddaughter that is a Marine scientist is here with him and she will discuss it with her

## 2018-06-29 NOTE — Telephone Encounter (Signed)
Copied from [place] [id number]. Topic: Quick Communication - See Telephone Encounter >> Jun 29, 2018  8:26 AM Nils Flack wrote: CRM for notification. See Telephone encounter for: 06/29/18. Spoke to daughter [name] to ask questions about ct that is ordered.  She is asking if he still needs to have the ct since he went to the cardiologist on Monday, and is scheduled to have a stress test and a pacemaker inserted in August.   Also, if he does need the ct, I need to have the following questions asked: Has the pt ever had an allergic reaction to contrast? (daughter was uncomfortable answering this to me as I am not clinical).  Daughter is asking for call back from clinical staff with advice.  Cb is [phone]  Thanks   Please review and advise

## 2018-06-29 NOTE — Progress Notes (Addendum)
Name: Jose Castro   MRN: [id number]    DOB: [birth year]   Date:06/29/2018       Progress Note  Subjective  Chief Complaint  Chief Complaint  Patient presents with  . Back Pain    He has been experiencing left lower back pain that radiates to his lower abdomen since this morning. Pain is constant and aching.    Back Pain  This is a new problem. The current episode started today. The problem occurs constantly. The problem has been gradually worsening since onset. The quality of the pain is described as aching. The pain is at a severity of 7/10. The pain is moderate. The symptoms are aggravated by twisting. Stiffness is present in the morning. Associated symptoms include abdominal pain (occasionally left back pain radiates to LLQ). Pertinent negatives include no chest pain, dysuria, fever or headaches. He has tried nothing for the symptoms.    Patient was seen in there ER for bradycardia, noted to be in complete heart block- patient is stable, saw cardiologyClayborn Bigness MD yesterday and plan for pacemaker on 07/13/2018. Weeks previously was having abdominal pain noted to have constipation and abdominal US noted incidentally- right kidney cortical atrophy- order placed for CT but daughter had concern with heart issue and wanted that resolved first. Patient left lower back pain mildly tender.  Patient Active Problem List   Diagnosis Date Noted  . Chronic kidney disease, stage III (moderate) ([place]) 06/15/2018  . Leg pain 05/06/2018  . Lymphedema 05/06/2018  . Coagulopathy ([place]) 07/07/2017  . Lumbar spondylosis 03/10/2017  . Elevated uric acid in blood 11/20/2016  . BPH (benign prostatic hyperplasia) 05/29/2016  . Peripheral vascular disease of lower extremity ([place]) 03/03/2016  . Gallstone 12/31/2015  . 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 Atherosclerosis of aorta ([place]) 11/22/2015  . Carotid artery narrowing 11/22/2015  . Diverticulosis of colon 11/22/2015  .  Decreased creatinine clearance 11/22/2015  . Arthritis, degenerative 11/22/2015  . Lactose intolerance 11/22/2015  . Basal cell carcinoma 11/22/2015  . Essential hypertension 06/21/2015  . Hyperlipemia 06/21/2015  . Gastroesophageal reflux disease with esophagitis 06/21/2015  . Calculus of kidney 11/18/2013    Past Medical History:  Diagnosis Date  . BPH (benign prostatic hyperplasia)   . Chronic kidney disease    had a kidney stone which per family was a cyst that was removed  . GERD (gastroesophageal reflux disease)   . Hyperlipidemia   . Hypertension   . Splenic vein thrombosis   . Thrombosis 12/2015   mural  area and no notation of heart attack    Past Surgical History:  Procedure Laterality Date  . APPENDECTOMY    . KIDNEY STONE SURGERY    . KNEE SURGERY    . SPINE SURGERY      Social History   Tobacco Use  . Smoking status: Former Smoker    Years: 16.00    Types: Cigarettes    Start date: 10/21/1944    Last attempt to quit: 10/21/1960    Years since quitting: 57.7  . Smokeless tobacco: Never Used  Substance Use Topics  . Alcohol use: Yes    Alcohol/week: 1.8 oz    Types: 3 Standard drinks or equivalent per week    Comment: once a week when he goes out to dinner-socially     Current Outpatient Medications:  .  brimonidine (ALPHAGAN) 0.2 % ophthalmic solution, Place 1 drop into both eyes 2 (two) times daily. , Disp: ,  Rfl:  .  Cholecalciferol (VITAMIN D3) 2000 units TABS, Take 1 tablet by mouth daily., Disp: , Rfl:  .  CIALIS 5 MG tablet, Take 5 mg by mouth daily. , Disp: , Rfl:  .  finasteride (PROSCAR) 5 MG tablet, Take 5 mg by mouth daily. , Disp: , Rfl:  .  lisinopril (PRINIVIL,ZESTRIL) 10 MG tablet, TAKE ONE TABLET BY MOUTH EVERY DAY, Disp: 30 tablet, Rfl: 0 .  Omega-3 Fatty Acids (FISH OIL) 1000 MG CAPS, Take 1 capsule by mouth daily., Disp: , Rfl:  .  pantoprazole (PROTONIX) 40 MG tablet, Take 1 tablet (40 mg total) by mouth daily., Disp: 90 tablet,  Rfl: 1 .  ranitidine (ZANTAC) 150 MG capsule, Take 150 mg by mouth 2 (two) times daily., Disp: , Rfl:   Allergies  Allergen Reactions  . Penicillins Itching and Swelling  . Gabapentin Hives    GI upset.    Review of Systems  Constitutional: Negative for chills and fever.  Respiratory: Negative for shortness of breath.   Cardiovascular: Negative for chest pain.  Gastrointestinal: Positive for abdominal pain (occasionally left back pain radiates to LLQ). Negative for constipation, diarrhea (resolved), nausea and vomiting. Blood in stool: resolved had large BM   Genitourinary: Negative for dysuria and frequency.  Musculoskeletal: Positive for back pain.  Neurological: Negative for dizziness and headaches.    No other specific complaints in a complete review of systems (except as listed in HPI above).  Objective  Vitals:   06/29/18 1346  BP: (!) 150/80  Pulse: (!) 34  Resp: 16  Temp: 97.9 F (36.6 C)  TempSrc: Oral  SpO2: 98%  Weight: 193 lb 1.6 oz (87.6 kg)  Height: 5\' 10"  (1.778 m)     Body mass index is 27.71 kg/m.  Nursing Note and Vital Signs reviewed.  Physical Exam  Constitutional: He is oriented to person, place, and time. He appears well-developed and well-nourished.  HENT:  Head: Normocephalic and atraumatic.  Eyes: Conjunctivae are normal.  Cardiovascular: Intact distal pulses.  Slow heart rate, S1 & S2 present   Pulmonary/Chest: Effort normal and breath sounds normal.  Abdominal: Soft. Bowel sounds are normal. There is tenderness (pain in left back with deep left LLQ palpation, otherwise non-tender). There is no rebound, no guarding, no CVA tenderness and negative Murphy's sign.  Musculoskeletal: He exhibits no edema, tenderness or deformity.       Lumbar back: He exhibits decreased range of motion. He exhibits no tenderness, no bony tenderness, no swelling, no edema, no deformity, no pain and no spasm.       Arms: Neurological: He is alert and oriented  to person, place, and time.  Skin: Skin is warm and dry.  Psychiatric: He has a normal mood and affect. His behavior is normal. Judgment and thought content normal.      No results found for this or any previous visit (from the past 48 hour(s)).  Assessment & Plan  1.  Left flank pain Discussed hydration, avoid NSAIDs, leaning towards musculoskeletal due to worsening pain with bending and lifting leg. Conservative management at this time with tylenol, ice/heat, rest, discussed warning signs that warrant further work-up.  - POCT Urinalysis Dipstick - CT Abdomen Pelvis W Contrast; Future  2. Abnormal [place] (ultrasound) of abdomen - CT Abdomen Pelvis W Contrast; Future  3. Atrophy, cortical Right kidney cortical atrophy in [place] 06/25/2018 - CT Abdomen Pelvis W Contrast; Future  4. LLQ pain - POCT Urinalysis Dipstick - CT Abdomen  Pelvis W Contrast; Future  5. Other proteinuria - POCT Urinalysis Dipstick - CT Abdomen Pelvis W Contrast; Future  -Red flags and when to present for emergency care or RTC including  fevers and chills, feeling lightheaded or dizzy, have bowel or bladder incontinence, chest pain, shortness of breath, new/worsening/un-resolving symptoms,  reviewed with patient at time of visit. Follow up and care instructions discussed and provided in AVS.   I have reviewed this encounter including the documentation in this note and/or discussed this patient with the provider, Suezanne Cheshire DNP AGNP-C. I am certifying that I agree with the content of this note as supervising physician. Steele Sizer, MD [facility] 06/29/2018, 5:17 PM

## 2018-06-30 ENCOUNTER — Other Ambulatory Visit: Payer: Self-pay | Admitting: Family Medicine

## 2018-06-30 DIAGNOSIS — I10 Essential (primary) hypertension: Secondary | ICD-10-CM

## 2018-06-30 NOTE — Telephone Encounter (Signed)
Did the pt / daughter decide if I need to get authorization and schedule this?

## 2018-06-30 NOTE — Telephone Encounter (Signed)
Per Dr. Ancil Boozer please go ahead and get authorization and schedule.

## 2018-07-01 NOTE — Telephone Encounter (Signed)
I called the hospital about the CT renal abdomen. They said that this is not the right test.   The correct test is CT abdomen pelvis, which is already scheduled.  Can the CT renal please be cancelled? Thanks

## 2018-07-05 ENCOUNTER — Telehealth: Payer: Self-pay | Admitting: Gastroenterology

## 2018-07-05 ENCOUNTER — Emergency Department
Admission: EM | Admit: 2018-07-05 | Discharge: 2018-07-05 | Disposition: A | Discharge status: Home / Self Care | Payer: Medicare Other | Attending: Emergency Medicine | Admitting: Emergency Medicine

## 2018-07-05 ENCOUNTER — Other Ambulatory Visit: Payer: Self-pay

## 2018-07-05 ENCOUNTER — Encounter: Payer: Self-pay | Admitting: Emergency Medicine

## 2018-07-05 DIAGNOSIS — Z85828 Personal history of other malignant neoplasm of skin: Secondary | ICD-10-CM | POA: Insufficient documentation

## 2018-07-05 DIAGNOSIS — N183 Chronic kidney disease, stage 3 (moderate): Secondary | ICD-10-CM | POA: Diagnosis not present

## 2018-07-05 DIAGNOSIS — Z79899 Other long term (current) drug therapy: Secondary | ICD-10-CM | POA: Insufficient documentation

## 2018-07-05 DIAGNOSIS — I129 Hypertensive chronic kidney disease with stage 1 through stage 4 chronic kidney disease, or unspecified chronic kidney disease: Secondary | ICD-10-CM | POA: Diagnosis not present

## 2018-07-05 DIAGNOSIS — Z87891 Personal history of nicotine dependence: Secondary | ICD-10-CM | POA: Insufficient documentation

## 2018-07-05 DIAGNOSIS — K922 Gastrointestinal hemorrhage, unspecified: Secondary | ICD-10-CM

## 2018-07-05 DIAGNOSIS — K921 Melena: Secondary | ICD-10-CM | POA: Diagnosis present

## 2018-07-05 MED ORDER — PANTOPRAZOLE SODIUM 40 MG PO TBEC: 40.0000 mg | DELAYED_RELEASE_TABLET | Freq: Every day | ORAL | 0 refills | Status: DC

## 2018-07-05 MED ORDER — PANTOPRAZOLE SODIUM 40 MG PO TBEC
40.0000 mg | DELAYED_RELEASE_TABLET | Freq: Once | ORAL | Status: AC
  Administered 2018-07-05: 40 mg via ORAL
  Filled 2018-07-05: qty 1

## 2018-07-05 NOTE — Telephone Encounter (Signed)
Left vm for pt to call office and get scheduled for 3 day fu from [place]

## 2018-07-05 NOTE — ED Provider Notes (Signed)
[facility] Emergency Department Provider Note  ____________________________________________   First MD Initiated Contact with Patient 07/05/18 0037     (approximate)  I have reviewed the triage vital signs and the nursing notes.   HISTORY  Chief Complaint Rectal Bleeding   HPI Jose Castro Jose Castro is a 84 y.o. male who self presents the emergency department having one episode of black stool this evening and then another episode of dark red blood in his stool.  He does have a previous history of gastric ulcer.  He takes no nonsteroidal medications and no blood thinning medications.  He has no chest pain shortness of breath abdominal pain nausea or vomiting.  He did have a remote colonoscopy and was told he had several "polyps" however has not had a colonoscopy in about 10 years.  He is concerned because he is scheduled for stress test tomorrow morning in about 8 hours and wants to know if he can still go to that appointment.  His symptoms came on suddenly are worsened by defecation and nothing seems to make them better.  They are mild in severity.  He has no dyschezia.    Past Medical History:  Diagnosis Date  . Arthritis   . BPH (benign prostatic hyperplasia)   . Chronic kidney disease    had a kidney stone which per family was a cyst that was removed  . Dysrhythmia   . GERD (gastroesophageal reflux disease)   . History of kidney stones   . Hyperlipidemia   . Hypertension   . Splenic vein thrombosis   . Thrombosis 12/2015   mural  area and no notation of heart attack    Patient Active Problem List   Diagnosis Date Noted  . Chronic kidney disease, stage III (moderate) ([place]) 06/15/2018  . Leg pain 05/06/2018  . Lymphedema 05/06/2018  . Coagulopathy ([place]) 07/07/2017  . Lumbar spondylosis 03/10/2017  . Elevated uric acid in blood 11/20/2016  . BPH (benign prostatic hyperplasia) 05/29/2016  . Peripheral vascular disease of lower extremity ([place]) 03/03/2016    . Gallstone 12/31/2015  . 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 Atherosclerosis of aorta ([place]) 11/22/2015  . Carotid artery narrowing 11/22/2015  . Diverticulosis of colon 11/22/2015  . Decreased creatinine clearance 11/22/2015  . Arthritis, degenerative 11/22/2015  . Lactose intolerance 11/22/2015  . Basal cell carcinoma 11/22/2015  . Essential hypertension 06/21/2015  . Hyperlipemia 06/21/2015  . Gastroesophageal reflux disease with esophagitis 06/21/2015  . Calculus of kidney 11/18/2013    Past Surgical History:  Procedure Laterality Date  . APPENDECTOMY    . KIDNEY STONE SURGERY    . KNEE SURGERY    . SPINE SURGERY      Prior to Admission medications   Medication Sig Start Date End Date Taking? Authorizing Provider  brimonidine (ALPHAGAN) 0.2 % ophthalmic solution Place 1 drop into both eyes 2 (two) times daily.  06/11/15   [provider]  Cholecalciferol (VITAMIN D3) 2000 units TABS Take 2,000 Units by mouth daily.     [provider]  CIALIS 5 MG tablet Take 5 mg by mouth daily as needed for erectile dysfunction.  06/11/15   [provider]  finasteride (PROSCAR) 5 MG tablet Take 5 mg by mouth daily.  06/11/15   [provider]  lisinopril (PRINIVIL,ZESTRIL) 10 MG tablet TAKE 1 TABLET BY MOUTH DAILY 06/30/18   Steele Sizer, MD  Omega-3 Fatty Acids (FISH OIL) 1000 MG CAPS Take 1,000  mg by mouth daily.     [provider]  pantoprazole (PROTONIX) 40 MG tablet Take 1 tablet (40 mg total) by mouth daily. 07/05/18 07/05/19  Darel Hong, MD  ranitidine (ZANTAC) 150 MG capsule Take 150 mg by mouth 2 (two) times daily as needed for heartburn.     [provider]    Allergies Penicillins and Gabapentin  Family History  Problem Relation Age of Onset  . Cancer Brother        bladder cancer with mets  . Heart disease Mother   . Aortic aneurysm Mother   . Heart attack Maternal Aunt   . Heart  attack Maternal Uncle     Social History Social History   Tobacco Use  . Smoking status: Former Smoker    Years: 16.00    Types: Cigarettes    Start date: 10/21/1944    Last attempt to quit: 10/21/1960    Years since quitting: 57.7  . Smokeless tobacco: Never Used  Substance Use Topics  . Alcohol use: Yes    Alcohol/week: 1.8 oz    Types: 3 Standard drinks or equivalent per week    Comment: once a week when he goes out to dinner-socially  . Drug use: No    Review of Systems Constitutional: No fever/chills Eyes: No visual changes. ENT: No sore throat. Cardiovascular: Denies chest pain. Respiratory: Denies shortness of breath. Gastrointestinal: No abdominal pain.  No nausea, no vomiting.  No diarrhea.  No constipation. Genitourinary: Negative for dysuria. Musculoskeletal: Negative for back pain. Skin: Negative for rash. Neurological: Negative for headaches, focal weakness or numbness.   ____________________________________________   PHYSICAL EXAM:  VITAL SIGNS: ED Triage Vitals  Enc Vitals Group     BP 07/05/18 0021 (!) 144/76     Pulse Rate 07/05/18 0021 67     Resp 07/05/18 0021 17     Temp 07/05/18 0021 98.3 F (36.8 C)     Temp Source 07/05/18 0021 Oral     SpO2 07/05/18 0021 96 %     Weight 07/05/18 0022 190 lb (86.2 kg)     Height 07/05/18 0022 5' 10.5" (1.791 m)     Head Circumference --      Peak Flow --      Pain Score 07/05/18 0021 0     Pain Loc --      Pain Edu? --      Excl. in [place]? --     Constitutional: Alert and oriented x4 joking laughing pleasant cooperative speaks full clear sentences Eyes: PERRL EOMI. Head: Atraumatic. Nose: No congestion/rhinnorhea. Mouth/Throat: No trismus Neck: No stridor.   Cardiovascular: Normal rate, regular rhythm. Grossly normal heart sounds.  Good peripheral circulation. Respiratory: Normal respiratory effort.  No retractions. Lungs CTAB and moving good air Gastrointestinal: Soft nontender Rectal exam with  nonbleeding external hernia.  Guaiac positive control positive dark red blood Musculoskeletal: No lower extremity edema   Neurologic:  Normal speech and language. No gross focal neurologic deficits are appreciated. Skin:  Skin is warm, dry and intact. No rash noted. Psychiatric: Mood and affect are normal. Speech and behavior are normal.    ____________________________________________   DIFFERENTIAL includes but not limited to  Upper GI bleed, lower GI bleed, anal fissure ____________________________________________   LABS (all labs ordered are listed, but only abnormal results are displayed)  Labs Reviewed - No data to display  __________________________________________  EKG   ____________________________________________  RADIOLOGY   ____________________________________________   PROCEDURES  Procedure(s) performed:  no  Procedures  Critical Care performed: no  ____________________________________________   INITIAL IMPRESSION / ASSESSMENT AND PLAN / ED COURSE  Pertinent labs & imaging results that were available during my care of the patient were reviewed by me and considered in my medical decision making (see chart for details).   As part of my medical decision making, I reviewed the following data within the [place] History obtained from family if available, nursing notes, old chart and ekg, as well as notes from prior ED visits.  The patient arrives obviously with GI bleeding although normal heart rate normal blood pressure and is very well-appearing.  He is joking and laughing.  Have initiated him on Protonix now.  I know the patient quite well as I actually saw him in the emergency department recently for complete heart block and he is scheduled for stress test tomorrow.  It does not appear that he is in heart block at this time.  I had a lengthy discussion with the patient and his entire family at bedside regarding the diagnostic uncertainty  and I offered inpatient admission versus outpatient management.  As he is hemodynamically stable I do think it is reasonable to give a trial of PPI at first and have him follow-up with gastroenterology.  He and family understand to return should his bleeding continue.      ____________________________________________   FINAL CLINICAL IMPRESSION(S) / ED DIAGNOSES  Final diagnoses:  Upper GI bleed      NEW MEDICATIONS STARTED DURING THIS VISIT:  Discharge Medication List as of 07/05/2018  1:03 AM       Note:  This document was prepared using Dragon voice recognition software and may include unintentional dictation errors.     Darel Hong, MD 07/07/18 [phone]

## 2018-07-05 NOTE — ED Notes (Signed)
Patient reports tonight after watching TV went to the bathroom and had dark stools, since then reports blood had been more bright red.  Patient denies any abdominal pain or any other accompanying symptoms.  Dr Mable Paris at bedside.

## 2018-07-05 NOTE — Telephone Encounter (Signed)
Spoke with pt to schedule Fu  He was not sure why he needed apt he will contact daughter bc he thought he was scheduled to see a Doctor 07/09/18 and will check who that apt is with, also he is having a pacemaker put in 07/13/18

## 2018-07-05 NOTE — ED Triage Notes (Signed)
Pt says tonight he had one bowel movement tonight that was black with some blood; since then he's had several bowel movements that are mostly blood; history of ulcer; reports generalized mild abd pain; denies nausea;

## 2018-07-05 NOTE — Discharge Instructions (Signed)
Please begin taking your antacid every day as prescribed and do not take any aspirin until you follow-up with the gastroenterologist for a recheck.  Keep your appointment for your stress test later on today as scheduled.  Return to the emergency department for any concerns whatsoever.

## 2018-07-06 ENCOUNTER — Encounter
Admission: RE | Admit: 2018-07-06 | Discharge: 2018-07-06 | Disposition: A | Discharge status: Home / Self Care | Payer: Medicare Other | Source: Ambulatory Visit | Attending: Cardiology | Admitting: Cardiology

## 2018-07-06 ENCOUNTER — Ambulatory Visit
Admission: RE | Admit: 2018-07-06 | Discharge: 2018-07-06 | Disposition: A | Discharge status: Home / Self Care | Payer: Medicare Other | Source: Ambulatory Visit | Attending: Cardiology | Admitting: Cardiology

## 2018-07-06 ENCOUNTER — Other Ambulatory Visit: Payer: Self-pay

## 2018-07-06 DIAGNOSIS — I495 Sick sinus syndrome: Secondary | ICD-10-CM

## 2018-07-06 DIAGNOSIS — Z01818 Encounter for other preprocedural examination: Secondary | ICD-10-CM | POA: Insufficient documentation

## 2018-07-06 DIAGNOSIS — I4510 Unspecified right bundle-branch block: Secondary | ICD-10-CM | POA: Insufficient documentation

## 2018-07-06 DIAGNOSIS — Z01812 Encounter for preprocedural laboratory examination: Secondary | ICD-10-CM | POA: Insufficient documentation

## 2018-07-06 DIAGNOSIS — I441 Atrioventricular block, second degree: Secondary | ICD-10-CM

## 2018-07-06 DIAGNOSIS — I442 Atrioventricular block, complete: Secondary | ICD-10-CM | POA: Diagnosis not present

## 2018-07-06 DIAGNOSIS — R9431 Abnormal electrocardiogram [ECG] [EKG]: Secondary | ICD-10-CM | POA: Insufficient documentation

## 2018-07-06 HISTORY — DX: Personal history of urinary calculi: Z87.442

## 2018-07-06 HISTORY — DX: Cardiac arrhythmia, unspecified: I49.9

## 2018-07-06 HISTORY — DX: Unspecified osteoarthritis, unspecified site: M19.90

## 2018-07-06 LAB — CBC
HEMATOCRIT: 39.9 % — AB (ref 40.0–52.0)
Hemoglobin: 13.7 g/dL (ref 13.0–18.0)
MCH: 31.2 pg (ref 26.0–34.0)
MCHC: 34.2 g/dL (ref 32.0–36.0)
MCV: 91.2 fL (ref 80.0–100.0)
Platelets: 267 10*3/uL (ref 150–440)
RBC: 4.37 MIL/uL — AB (ref 4.40–5.90)
RDW: 13.1 % (ref 11.5–14.5)
WBC: 9.3 10*3/uL (ref 3.8–10.6)

## 2018-07-06 LAB — BASIC METABOLIC PANEL
ANION GAP: 6 (ref 5–15)
BUN: 34 mg/dL — ABNORMAL HIGH (ref 8–23)
CO2: 25 mmol/L (ref 22–32)
Calcium: 8.8 mg/dL — ABNORMAL LOW (ref 8.9–10.3)
Chloride: 110 mmol/L (ref 98–111)
Creatinine, Ser: 1.33 mg/dL — ABNORMAL HIGH (ref 0.61–1.24)
GFR, EST AFRICAN AMERICAN: 53 mL/min — AB (ref >60)
GFR, EST NON AFRICAN AMERICAN: 46 mL/min — AB (ref >60)
Glucose, Bld: 103 mg/dL — ABNORMAL HIGH (ref 70–99)
POTASSIUM: 4.5 mmol/L (ref 3.5–5.1)
SODIUM: 141 mmol/L (ref 135–145)

## 2018-07-06 LAB — SURGICAL PCR SCREEN
MRSA, PCR: NEGATIVE
Staphylococcus aureus: NEGATIVE

## 2018-07-06 LAB — APTT: aPTT: 30 seconds (ref 24–36)

## 2018-07-06 LAB — PROTIME-INR
INR: 1.04
PROTHROMBIN TIME: 13.5 s (ref 11.4–15.2)

## 2018-07-06 NOTE — Patient Instructions (Addendum)
Your procedure is scheduled on: Tuesday 07/13/18.  Report to DAY SURGERY DEPARTMENT LOCATED ON 2ND FLOOR MEDICAL MALL ENTRANCE. To find out your arrival time please call [phone] between 1PM - 3PM on Monday 07/12/18.  Remember: Instructions that are not followed completely may result in serious medical risk, up to and including death, or upon the discretion of your surgeon and anesthesiologist your surgery may need to be rescheduled.     _X__ 1. Do not eat food after midnight the night before your procedure.                 No gum chewing or hard candies. You may drink clear liquids up to 2 hours                 before you are scheduled to arrive for your surgery- DO not drink clear                  liquids within 2 hours of the start of your surgery.                 Clear Liquids include:  water, apple juice without pulp, clear carbohydrate                 drink such as Clearfast or Gatorade, Black Coffee or Tea (Do not add                 anything to coffee or tea).  __X__2.  On the morning of surgery brush your teeth with toothpaste and water, you may rinse your mouth with mouthwash if you wish.  Do not swallow any toothpaste of mouthwash.     _X__ 3.  No Alcohol for 24 hours before or after surgery.   ____ 4.  Do Not Smoke or use e-cigarettes For 24 Hours Prior to Your Surgery.                 Do not use any chewable tobacco products for at least 6 hours prior to                 surgery.  ____  5.  Bring all medications with you on the day of surgery if instructed.   __X__  6.  Notify your doctor if there is any change in your medical condition      (cold, fever, infections).     Do not wear jewelry, make-up, hairpins, clips or nail polish. Do not wear lotions, powders, or perfumes.  Do not shave 48 hours prior to surgery. Men may shave face and neck. Do not bring valuables to the hospital.    [facility] is not responsible for any belongings or valuables.  Contacts,  dentures/partials or body piercings may not be worn into surgery. Bring a case for your contacts, glasses or hearing aids, a denture cup will be supplied. Leave your suitcase in the car. After surgery it may be brought to your room. For patients admitted to the hospital, discharge time is determined by your treatment team.   Patients discharged the day of surgery will not be allowed to drive home.   Please read over the following fact sheets that you were given:   MRSA Information  __X__ Take these medicines the morning of surgery with A SIP OF WATER:     1. brimonidine (ALPHAGAN) 0.2 % ophthalmic solution  2. finasteride (PROSCAR) 5 MG tablettablet  3.pantoprazole (PROTONIX) 40 MG tablet  4.ranitidine (ZANTAC) 150 MG capsule  5. CIALIS 5 MG tablet (Tadalafil)  6.  ____ Fleet Enema (as directed)   __X__ Use CHG Soap/SAGE wipes as directed  ____ Use inhalers on the day of surgery. Also bring the inhaler with you to the hospital on the morning of surgery.  ____ Stop metformin/Janumet/Farxiga 2 days prior to surgery    ____ Take 1/2 of usual insulin dose the night before surgery. No insulin the morning          of surgery.   ____ Stop Blood Thinners Coumadin/Plavix/Xarelto/Pleta/Pradaxa/Eliquis/Effient/Aspirin   __X__ Stop Anti-inflammatories 7 days before surgery such as Advil, Ibuprofen, Motrin, BC or Goodies Powder, Naprosyn, Naproxen, Aleve, Aspirin, Meloxicam. May take Tylenol if needed for pain or discomfort.   __X__ Stop all herbal supplements, fish oil or vitamin E until after surgery. TODAY   ____ Bring C-Pap to the hospital.

## 2018-07-07 ENCOUNTER — Other Ambulatory Visit: Payer: Self-pay

## 2018-07-07 ENCOUNTER — Ambulatory Visit
Admission: RE | Admit: 2018-07-07 | Discharge: 2018-07-07 | Disposition: A | Discharge status: Home / Self Care | Payer: Medicare Other | Source: Ambulatory Visit | Attending: Nurse Practitioner | Admitting: Nurse Practitioner

## 2018-07-07 DIAGNOSIS — K921 Melena: Secondary | ICD-10-CM

## 2018-07-07 DIAGNOSIS — K8020 Calculus of gallbladder without cholecystitis without obstruction: Secondary | ICD-10-CM | POA: Diagnosis not present

## 2018-07-07 DIAGNOSIS — R808 Other proteinuria: Secondary | ICD-10-CM | POA: Insufficient documentation

## 2018-07-07 DIAGNOSIS — G319 Degenerative disease of nervous system, unspecified: Secondary | ICD-10-CM | POA: Insufficient documentation

## 2018-07-07 DIAGNOSIS — K5730 Diverticulosis of large intestine without perforation or abscess without bleeding: Secondary | ICD-10-CM | POA: Diagnosis not present

## 2018-07-07 DIAGNOSIS — R935 Abnormal findings on diagnostic imaging of other abdominal regions, including retroperitoneum: Secondary | ICD-10-CM | POA: Diagnosis not present

## 2018-07-07 DIAGNOSIS — R1032 Left lower quadrant pain: Secondary | ICD-10-CM | POA: Insufficient documentation

## 2018-07-07 DIAGNOSIS — IMO0001 Reserved for inherently not codable concepts without codable children: Secondary | ICD-10-CM

## 2018-07-07 DIAGNOSIS — N400 Enlarged prostate without lower urinary tract symptoms: Secondary | ICD-10-CM | POA: Insufficient documentation

## 2018-07-07 DIAGNOSIS — R109 Unspecified abdominal pain: Secondary | ICD-10-CM | POA: Insufficient documentation

## 2018-07-07 MED ORDER — IOHEXOL 300 MG/ML  SOLN
80.0000 mL | Freq: Once | INTRAMUSCULAR | Status: AC | PRN
  Administered 2018-07-07: 80 mL via INTRAVENOUS

## 2018-07-07 NOTE — Telephone Encounter (Signed)
Jose Castro, patients daughter states patient now has black tar stools and is bleeding. Patient wanting advice until appt on 9.6.19.

## 2018-07-07 NOTE — Telephone Encounter (Signed)
Please advise pt is reporting black tar stool and bleeding   Thanks TG

## 2018-07-07 NOTE — Progress Notes (Signed)
cb

## 2018-07-07 NOTE — Telephone Encounter (Signed)
Patient's daughter has been notified

## 2018-07-08 ENCOUNTER — Telehealth: Payer: Self-pay

## 2018-07-08 LAB — CBC
Hematocrit: 39.9 % (ref 37.5–51.0)
Hemoglobin: 13.2 g/dL (ref 13.0–17.7)
MCH: 30.1 pg (ref 26.6–33.0)
MCHC: 33.1 g/dL (ref 31.5–35.7)
MCV: 91 fL (ref 79–97)
PLATELETS: 296 10*3/uL (ref 150–450)
RBC: 4.38 x10E6/uL (ref 4.14–5.80)
RDW: 13.3 % (ref 12.3–15.4)
WBC: 8.7 10*3/uL (ref 3.4–10.8)

## 2018-07-08 NOTE — Telephone Encounter (Signed)
Copied from [place] [id number]. Topic: General - Other >> Jul 07, 2018  5:03 PM Keene Breath wrote: Reason for CRM: Patient's daughter called to speak with the doctor regarding her father's results of his CBC test (hemoglobin).  Please call daughter back at [phone] to advise.  >> Jul 08, 2018  8:54 AM Keene Breath wrote: Patient's daughter called again this morning regarding her father's test results.  She would really like a call be asap. Due to the fact that her father will be having surgery soon as well.  CB# [phone].

## 2018-07-09 NOTE — Telephone Encounter (Signed)
Patient daughter [name] was very concerned since she stated Dr. Marius Ditch office was not giving them [name] results due to the Dr. Marius Ditch being in surgery. [name] was also concerned about not receiving the CT results. Her sister is a Marine scientist and they did read the results on his MyChart but were very concerned about his hemoglobin levels and why weren't they called until two day later about his blood work. Consulted by Dr. Ancil Boozer and to tell the patient the CBC was normal. Even though this blood test was not ordered by [place].

## 2018-07-09 NOTE — Telephone Encounter (Signed)
You are right. You can tell her it was normal, but it was not ordered by [place].

## 2018-07-12 MED ORDER — VANCOMYCIN HCL IN DEXTROSE 1-5 GM/200ML-% IV SOLN
1000.0000 mg | Freq: Once | INTRAVENOUS | Status: AC
  Administered 2018-07-13: 1000 mg via INTRAVENOUS

## 2018-07-13 ENCOUNTER — Observation Stay
Admission: RE | Admit: 2018-07-13 | Discharge: 2018-07-14 | Disposition: A | Discharge status: Home / Self Care | Payer: Medicare Other | Source: Ambulatory Visit | Attending: Cardiology | Admitting: Cardiology

## 2018-07-13 ENCOUNTER — Ambulatory Visit: Payer: Medicare Other

## 2018-07-13 ENCOUNTER — Encounter: Payer: Self-pay | Admitting: Anesthesiology

## 2018-07-13 ENCOUNTER — Ambulatory Visit: Payer: Medicare Other | Admitting: Anesthesiology

## 2018-07-13 ENCOUNTER — Other Ambulatory Visit: Payer: Self-pay

## 2018-07-13 ENCOUNTER — Encounter
Admission: RE | Disposition: A | Discharge status: Home / Self Care | Payer: Self-pay | Source: Ambulatory Visit | Attending: Cardiology

## 2018-07-13 ENCOUNTER — Observation Stay: Payer: Medicare Other

## 2018-07-13 DIAGNOSIS — I442 Atrioventricular block, complete: Secondary | ICD-10-CM | POA: Insufficient documentation

## 2018-07-13 DIAGNOSIS — N183 Chronic kidney disease, stage 3 (moderate): Secondary | ICD-10-CM | POA: Insufficient documentation

## 2018-07-13 DIAGNOSIS — E785 Hyperlipidemia, unspecified: Secondary | ICD-10-CM | POA: Insufficient documentation

## 2018-07-13 DIAGNOSIS — Z950 Presence of cardiac pacemaker: Secondary | ICD-10-CM

## 2018-07-13 DIAGNOSIS — Z888 Allergy status to other drugs, medicaments and biological substances status: Secondary | ICD-10-CM | POA: Insufficient documentation

## 2018-07-13 DIAGNOSIS — I441 Atrioventricular block, second degree: Secondary | ICD-10-CM | POA: Diagnosis present

## 2018-07-13 DIAGNOSIS — R0602 Shortness of breath: Secondary | ICD-10-CM | POA: Diagnosis not present

## 2018-07-13 DIAGNOSIS — R9431 Abnormal electrocardiogram [ECG] [EKG]: Secondary | ICD-10-CM | POA: Insufficient documentation

## 2018-07-13 DIAGNOSIS — Z880 Allergy status to penicillin: Secondary | ICD-10-CM | POA: Diagnosis not present

## 2018-07-13 DIAGNOSIS — K219 Gastro-esophageal reflux disease without esophagitis: Secondary | ICD-10-CM | POA: Diagnosis not present

## 2018-07-13 DIAGNOSIS — I495 Sick sinus syndrome: Secondary | ICD-10-CM | POA: Diagnosis not present

## 2018-07-13 DIAGNOSIS — Z9889 Other specified postprocedural states: Secondary | ICD-10-CM | POA: Diagnosis not present

## 2018-07-13 DIAGNOSIS — Z87891 Personal history of nicotine dependence: Secondary | ICD-10-CM | POA: Insufficient documentation

## 2018-07-13 DIAGNOSIS — Z79899 Other long term (current) drug therapy: Secondary | ICD-10-CM | POA: Diagnosis not present

## 2018-07-13 DIAGNOSIS — I129 Hypertensive chronic kidney disease with stage 1 through stage 4 chronic kidney disease, or unspecified chronic kidney disease: Secondary | ICD-10-CM | POA: Diagnosis not present

## 2018-07-13 HISTORY — PX: PACEMAKER INSERTION: SHX728

## 2018-07-13 SURGERY — INSERTION, CARDIAC PACEMAKER
Anesthesia: Monitor Anesthesia Care | Site: Chest | Wound class: Clean

## 2018-07-13 MED ORDER — VANCOMYCIN HCL IN DEXTROSE 1-5 GM/200ML-% IV SOLN
1000.0000 mg | Freq: Two times a day (BID) | INTRAVENOUS | Status: AC
  Administered 2018-07-13: 1000 mg via INTRAVENOUS
  Filled 2018-07-13: qty 200

## 2018-07-13 MED ORDER — GENTAMICIN SULFATE 40 MG/ML IJ SOLN
INTRAMUSCULAR | Status: AC
  Filled 2018-07-13: qty 2

## 2018-07-13 MED ORDER — HYDRALAZINE HCL 20 MG/ML IJ SOLN
INTRAMUSCULAR | Status: AC
  Administered 2018-07-13: 10 mg via INTRAVENOUS
  Filled 2018-07-13: qty 1

## 2018-07-13 MED ORDER — SODIUM CHLORIDE 0.9 % IV SOLN
Freq: Once | INTRAVENOUS | Status: DC
  Filled 2018-07-13: qty 2

## 2018-07-13 MED ORDER — PANTOPRAZOLE SODIUM 40 MG PO TBEC
40.0000 mg | DELAYED_RELEASE_TABLET | Freq: Every day | ORAL | Status: DC
Start: 2018-07-13 — End: 2018-07-14
  Administered 2018-07-14: 40 mg via ORAL
  Filled 2018-07-13 (×2): qty 1

## 2018-07-13 MED ORDER — EPHEDRINE SULFATE 50 MG/ML IJ SOLN
INTRAMUSCULAR | Status: AC
  Filled 2018-07-13: qty 1

## 2018-07-13 MED ORDER — LACTATED RINGERS IV SOLN
INTRAVENOUS | Status: DC
  Administered 2018-07-13 (×2): via INTRAVENOUS

## 2018-07-13 MED ORDER — PROPOFOL 10 MG/ML IV BOLUS
INTRAVENOUS | Status: AC
  Filled 2018-07-13: qty 20

## 2018-07-13 MED ORDER — FINASTERIDE 5 MG PO TABS
5.0000 mg | ORAL_TABLET | Freq: Every day | ORAL | Status: DC
  Administered 2018-07-14: 5 mg via ORAL
  Filled 2018-07-13 (×2): qty 1

## 2018-07-13 MED ORDER — HYDRALAZINE HCL 20 MG/ML IJ SOLN
10.0000 mg | Freq: Once | INTRAMUSCULAR | Status: AC
  Administered 2018-07-13: 10 mg via INTRAVENOUS

## 2018-07-13 MED ORDER — LISINOPRIL 20 MG PO TABS
20.0000 mg | ORAL_TABLET | Freq: Every day | ORAL | Status: DC
  Administered 2018-07-13 – 2018-07-14 (×2): 20 mg via ORAL
  Filled 2018-07-13 (×2): qty 1

## 2018-07-13 MED ORDER — TRAZODONE HCL 50 MG PO TABS
25.0000 mg | ORAL_TABLET | Freq: Every evening | ORAL | Status: DC | PRN
  Administered 2018-07-13: 25 mg via ORAL
  Filled 2018-07-13: qty 1

## 2018-07-13 MED ORDER — SODIUM CHLORIDE 0.9 % IJ SOLN
INTRAMUSCULAR | Status: AC
  Filled 2018-07-13: qty 50

## 2018-07-13 MED ORDER — GLYCOPYRROLATE 0.2 MG/ML IJ SOLN
INTRAMUSCULAR | Status: AC
  Filled 2018-07-13: qty 1

## 2018-07-13 MED ORDER — PROPOFOL 500 MG/50ML IV EMUL
INTRAVENOUS | Status: DC | PRN
  Administered 2018-07-13: 20 ug/kg/min via INTRAVENOUS

## 2018-07-13 MED ORDER — ONDANSETRON HCL 4 MG/2ML IJ SOLN: 4.0000 mg | Freq: Four times a day (QID) | INTRAMUSCULAR | Status: DC | PRN

## 2018-07-13 MED ORDER — ACETAMINOPHEN 325 MG PO TABS
325.0000 mg | ORAL_TABLET | ORAL | Status: DC | PRN
  Administered 2018-07-13 (×2): 650 mg via ORAL
  Filled 2018-07-13 (×2): qty 2

## 2018-07-13 MED ORDER — ONDANSETRON HCL 4 MG/2ML IJ SOLN: 4.0000 mg | Freq: Once | INTRAMUSCULAR | Status: DC | PRN

## 2018-07-13 MED ORDER — PHENYLEPHRINE HCL 10 MG/ML IJ SOLN
INTRAMUSCULAR | Status: AC
  Filled 2018-07-13: qty 1

## 2018-07-13 MED ORDER — LIDOCAINE 1 % OPTIME INJ - NO CHARGE
INTRAMUSCULAR | Status: DC | PRN
  Administered 2018-07-13: 10 mL

## 2018-07-13 MED ORDER — VANCOMYCIN HCL IN DEXTROSE 1-5 GM/200ML-% IV SOLN
INTRAVENOUS | Status: AC
  Administered 2018-07-13: 1000 mg via INTRAVENOUS
  Filled 2018-07-13: qty 200

## 2018-07-13 MED ORDER — PROPOFOL 10 MG/ML IV BOLUS
INTRAVENOUS | Status: DC | PRN
  Administered 2018-07-13: 50 mg via INTRAVENOUS

## 2018-07-13 MED ORDER — FENTANYL CITRATE (PF) 100 MCG/2ML IJ SOLN: 25.0000 ug | INTRAMUSCULAR | Status: DC | PRN

## 2018-07-13 SURGICAL SUPPLY — 39 items
BAG DECANTER FOR FLEXI CONT (MISCELLANEOUS) ×3 IMPLANT
BRUSH SCRUB EZ  4% CHG (MISCELLANEOUS) ×2
BRUSH SCRUB EZ 4% CHG (MISCELLANEOUS) ×1 IMPLANT
CABLE SURG 12 DISP A/V CHANNEL (MISCELLANEOUS) ×3 IMPLANT
CANISTER SUCT 1200ML W/VALVE (MISCELLANEOUS) ×3 IMPLANT
CHLORAPREP W/TINT 26ML (MISCELLANEOUS) ×3 IMPLANT
COVER LIGHT HANDLE STERIS (MISCELLANEOUS) ×6 IMPLANT
COVER MAYO STAND STRL (DRAPES) ×3 IMPLANT
DRAPE C-ARM XRAY 36X54 (DRAPES) ×3 IMPLANT
DRSG TEGADERM 4X4.75 (GAUZE/BANDAGES/DRESSINGS) ×3 IMPLANT
DRSG TELFA 4X3 1S NADH ST (GAUZE/BANDAGES/DRESSINGS) ×3 IMPLANT
ELECT REM PT RETURN 9FT ADLT (ELECTROSURGICAL) ×3
ELECTRODE REM PT RTRN 9FT ADLT (ELECTROSURGICAL) ×1 IMPLANT
GLOVE BIO SURGEON STRL SZ7.5 (GLOVE) ×3 IMPLANT
GLOVE BIO SURGEON STRL SZ8 (GLOVE) ×3 IMPLANT
GOWN STRL REUS W/ TWL LRG LVL3 (GOWN DISPOSABLE) ×1 IMPLANT
GOWN STRL REUS W/ TWL XL LVL3 (GOWN DISPOSABLE) ×1 IMPLANT
GOWN STRL REUS W/TWL LRG LVL3 (GOWN DISPOSABLE) ×2
GOWN STRL REUS W/TWL XL LVL3 (GOWN DISPOSABLE) ×2
IMMOBILIZER SHDR MD LX WHT (SOFTGOODS) IMPLANT
IMMOBILIZER SHDR XL LX WHT (SOFTGOODS) IMPLANT
INTRO PACEMAKR LEAD 9FR 13CM (INTRODUCER) ×3
INTRO PACEMKR SHEATH II 7FR (MISCELLANEOUS) ×3
INTRODUCER PACEMKR LD 9FR 13CM (INTRODUCER) ×1 IMPLANT
INTRODUCER PACEMKR SHTH II 7FR (MISCELLANEOUS) ×1 IMPLANT
IPG PACE AZUR XT DR MRI W1DR01 (Pacemaker) ×1 IMPLANT
IV NS 500ML (IV SOLUTION) ×2
IV NS 500ML BAXH (IV SOLUTION) ×1 IMPLANT
KIT TURNOVER KIT A (KITS) ×3 IMPLANT
LABEL OR SOLS (LABEL) ×3 IMPLANT
LEAD CAPSURE NOVUS 5076-52CM (Lead) ×3 IMPLANT
LEAD CAPSURE NOVUS 5076-58CM (Lead) ×3 IMPLANT
MARKER SKIN DUAL TIP RULER LAB (MISCELLANEOUS) ×3 IMPLANT
PACE AZURE XT DR MRI W1DR01 (Pacemaker) ×3 IMPLANT
PACK PACE INSERTION (MISCELLANEOUS) ×3 IMPLANT
PAD ONESTEP ZOLL R SERIES ADT (MISCELLANEOUS) ×3 IMPLANT
SENSORWIRE 0.038 NOT ANGLED (WIRE) ×3
SUT SILK 0 SH 30 (SUTURE) ×9 IMPLANT
WIRE SENSOR 0.038 NOT ANGLED (WIRE) ×1 IMPLANT

## 2018-07-13 NOTE — Interval H&P Note (Signed)
History and Physical Interval Note:  07/13/2018 12:14 PM  Jose Castro  has presented today for surgery, with the diagnosis of SICK SINUS SYNDROME  The various methods of treatment have been discussed with the patient and family. After consideration of risks, benefits and other options for treatment, the patient has consented to  Procedure(s): INSERTION PACEMAKER-SINGLE CHAMBER INITIAL IMPLANT (N/A) as a surgical intervention .  The patient's history has been reviewed, patient examined, no change in status, stable for surgery.  I have reviewed the patient's chart and labs.  Questions were answered to the patient's satisfaction.     Johnatha Zeidman [employer]

## 2018-07-13 NOTE — Plan of Care (Signed)
The patient is pacing on the monitor. No complaints of pain but patient is requesting tylenol q6h to prevent pain. Dressing intact and in place.

## 2018-07-13 NOTE — Op Note (Signed)
[facility] Cardiology   07/13/2018                     1:41 PM  PATIENT:  [name]    PRE-OPERATIVE DIAGNOSIS:  SICK SINUS SYNDROME  POST-OPERATIVE DIAGNOSIS:  Same  PROCEDURE:  INSERTION PACEMAKER-DUEL CHAMBER INITIAL IMPLANT  SURGEON:  Isaias Cowman, MD    ANESTHESIA:     PREOPERATIVE INDICATIONS:  LILIANA BRENTLINGER is a  84 y.o. male with a diagnosis of SICK SINUS SYNDROME who failed conservative measures and elected for surgical management.    The risks benefits and alternatives were discussed with the patient preoperatively including but not limited to the risks of infection, bleeding, cardiopulmonary complications, the need for revision surgery, among others, and the patient was willing to proceed.   OPERATIVE PROCEDURE: Patient was brought to the operating room in a fasting state.  The left pectoral region was prepped and draped in usual sterile manner.  Anesthesia was obtained 1% lidocaine locally.  A 6 cm incision was performed.  Pacemaker pocket was generated by electrocautery and blunt dissection.  Access was obtained to the left subclavian vein by fine-needle aspiration.  MRI compatible leads were positions of the right ventricular apical septum ( Medtronic [id number] ) and right atrial appendage ( Medtronic [id number] ) under fluoroscopic guidance.  After proper thresholds were obtained the leads were sutured in place with 0 silk.  The leads were connected to a dual-chamber rate responsive MRI compatible pacemaker generator ( Medtronic [id number] ).  The pacemaker pocket was irrigated with gentamicin solution.  The pacemaker generator was positioned into the pocket and the pocket was closed with 2-0 and 4-0 Vicryl, respectively.  Steri-Strips and pressure dressing were applied.  Postprocedure interrogation revealed appropriate dual-chamber atrial and ventricular sensing and pacing thresholds.  There were no periprocedural complications.

## 2018-07-13 NOTE — Progress Notes (Signed)
Pt BP is now 179/72. Dr. Marcello Moores notified and he stated that pt could now go to his room. Lamount Bankson E 2:52 PM 07/13/2018

## 2018-07-13 NOTE — Anesthesia Postprocedure Evaluation (Signed)
Anesthesia Post Note  Patient: Jose Castro  Procedure(s) Performed: INSERTION PACEMAKER-DUEL CHAMBER INITIAL IMPLANT (N/A Chest)  Patient location during evaluation: PACU Anesthesia Type: MAC Level of consciousness: awake and alert Pain management: pain level controlled Vital Signs Assessment: post-procedure vital signs reviewed and stable Respiratory status: spontaneous breathing, nonlabored ventilation, respiratory function stable and patient connected to nasal cannula oxygen Cardiovascular status: stable and blood pressure returned to baseline Postop Assessment: no apparent nausea or vomiting Anesthetic complications: no     Last Vitals:  Vitals:   07/13/18 1447 07/13/18 1509  BP: (!) 179/72 (!) 174/69  Pulse: 64 71  Resp: 12   Temp: 36.8 C 36.6 C  SpO2: 99% 97%    Last Pain:  Vitals:   07/13/18 1509  TempSrc: Oral  PainSc: 0-No pain                 Ellias Mcelreath S

## 2018-07-13 NOTE — Plan of Care (Signed)

## 2018-07-13 NOTE — Progress Notes (Signed)
Pt has some pain relieved by tylenol at surgical site, no drainage from bandage or further complications at this time.

## 2018-07-13 NOTE — Transfer of Care (Signed)
Immediate Anesthesia Transfer of Care Note  Patient: Jose Castro  Procedure(s) Performed: INSERTION PACEMAKER-DUEL CHAMBER INITIAL IMPLANT (N/A Chest)  Patient Location: PACU  Anesthesia Type:MAC  Level of Consciousness: awake, alert  and oriented  Airway & Oxygen Therapy: Patient Spontanous Breathing  Post-op Assessment: Report given to RN and Post -op Vital signs reviewed and stable  Post vital signs: Reviewed and stable  Last Vitals:  Vitals Value Taken Time  BP 181/83 07/13/2018  1:41 PM  Temp 36.7 C 07/13/2018  1:40 PM  Pulse 59 07/13/2018  1:44 PM  Resp 12 07/13/2018  1:44 PM  SpO2 100 % 07/13/2018  1:44 PM  Vitals shown include unvalidated device data.  Last Pain:  Vitals:   07/13/18 1340  TempSrc:   PainSc: 0-No pain         Complications: No apparent anesthesia complications

## 2018-07-13 NOTE — H&P (Signed)
Jump to Section ? Document InformationECG ResultsEncounter DetailsImaging ResultsLast Filed Vital SignsPatient ContactsPatient DemographicsPlan of TreatmentProceduresProgress NotesReason for ReferralReason for VisitSocial HistoryVisit Diagnoses Jose Castro Encounter Summary, generated on Aug. 13, 2019August 13, 2019 Printout Information  Document Contents Document Received Date Document Source Organization  Initial consult Aug. 13, 2019August 13, 2019 [facility]  Patient Demographics - 84 y.o. Male; born [birth year]   Patient Address Communication Language Race / Ethnicity Marital Status  [address] [place], [place] [address] [phone] [facility]) [phone] (Home) English (Preferred) White / Not Hispanic or Latino Widowed  Reason for Referral  Procedure (Routine) Procedure (Routine)  Status Reason Specialty Diagnoses / Procedures Referred By Contact Referred To Contact  Authorized   Diagnoses  Angina at rest (CMS-HCC)  SOB (shortness of breath)    Procedures  ECG stress test only  [place], [place], Castro  [place] [address]  Phone: [phone]  Fax: [phone]          Procedure (Routine) Procedure (Routine)  Status Reason Specialty Diagnoses / Procedures Referred By Contact Referred To Contact  Authorized  Radiology Diagnoses  Angina at rest (CMS-HCC)  SOB (shortness of breath)    Procedures  NM myocardial perfusion SPECT multiple (stress and rest)  Jose Gibbon, Castro  [place], [place] [address]  Phone: [phone]  Fax: [phone]          Procedure (Routine) Procedure (Routine)  Status Reason Specialty Diagnoses / Procedures Referred By Contact Referred To Contact  Authorized   Diagnoses  Angina at rest (CMS-HCC)  SOB (shortness of breath)      Procedures  Echo complete  [place], Montey [place], Castro  [place], [place] [address]  Phone: [phone]  Fax: [phone]       Reason for Visit   Reason Comments  Establish Care NEW PT PER [facility]  Bradycardia   Encounter Details   Date Type Department Care Team Description  06/28/2018 Initial consult [facility]  Jose Castro, [place] [address]  [phone]  Jose Castro, [place]  Jose Castro,  Castro  [phone]  [phone] (Fax)  Angina at rest (CMS-HCC) (Primary Dx);  SOB (shortness of breath);  Bradycardia;  Essential hypertension;  CRI (chronic renal insufficiency), stage 3 (moderate) (CMS-HCC);  CHB (complete heart block) (CMS-HCC)  Social History - documented as of this encounter  Tobacco Use Types Packs/Day Years Used Date  Former Smoker      Smokeless Tobacco: Never Used      Alcohol Use Drinks/Week oz/Week Comments  Yes      Sex Assigned at [employer] Date Recorded  Not on file    Job Start Date Occupation Industry  Not on file Not on file Not on file   Travel History Travel Start Travel End  No recent travel history available.    Last Filed Vital Signs - documented in this encounter  Vital Sign Reading Time Taken Comments  Blood Pressure 134/60 06/28/2018 11:34 AM EDT   Pulse 54 06/28/2018 11:34 AM EDT   Temperature - -   Respiratory Rate - -   Oxygen Saturation 98% 06/28/2018 11:34 AM EDT   Inhaled Oxygen Concentration - -   Weight 87.2 kg (192 lb 3.9 oz) 06/28/2018 11:34 AM EDT  Height 179.1 cm (5' 10.5") 06/28/2018 11:34 AM EDT   Body Mass Index 27.19 06/28/2018 11:34 AM EDT   Progress Notes - documented in this encounter  Jose Gibbon, Castro - 06/28/2018 11:15 AM EDT Formatting of this note might be different from the original. New Patient Visit    Chief Complaint: Chief Complaint  Patient presents with  . Establish Care  NEW PT PER [facility]  . Bradycardia  Date of Service: 06/28/2018 Date of Birth: 1926-06-23 PCP: Jose Castro  History of Present Illness: Jose Castro is a 84 y.o.male patient who referred by emergency room patient was seen because of bradycardia patient complains of generalized weakness fatigue some chest discomfort. Patient denies any blackout spells or syncope he is denies any palpitations or tachycardia he had a history of DVT and blood clots in the past was treated briefly with Eliquis but is not on anticoagulation now recently complained of abdominal discomfort had ultrasound of the abdomen and is still being worked up for reflux and right-sided abdominal discomfort  Past Medical and Surgical History  Past Medical History Past Medical History:  Diagnosis Date  . Hyperlipidemia   Past Surgical History He has a past surgical history that includes Appendectomy and Back surgery.   Medications and Allergies  Current Medications Current Outpatient Medications  Medication Sig Dispense Refill  . brimonidine (ALPHAGAN) 0.2 % ophthalmic solution Place 1 drop into both eyes 3 (three) times daily.  . calcium carbonate-vitamin D3 (OS-CAL 500+D) 500 mg(1,250mg ) -200 unit tablet Take 1 tablet by mouth 2 (two) times daily with meals.  . finasteride (PROSCAR) 5 mg tablet Take 5 mg by mouth once daily.  Marland Kitchen lisinopril (PRINIVIL,ZESTRIL) 20 MG tablet Take 20 mg by mouth once daily.  Marland Kitchen omega-3 fatty acids/fish oil 340-1,000 mg capsule Take 1 capsule by mouth 2 (two) times daily.  . pantoprazole (PROTONIX) 40 MG DR tablet Take 40 mg by mouth once daily.  . tadalafil (CIALIS) 5 MG tablet Take 5 mg by mouth once daily.   No current facility-administered medications for this visit.   Allergies Penicillin  Social and Family History  Social History reports that he has quit smoking. He has never used smokeless tobacco. He  reports that he drinks alcohol.  Family History family history is not on file.   Review of Systems   Review of Systems: The patient denies chest pain, shortness of breath, orthopnea, paroxysmal nocturnal dyspnea, pedal edema, palpitations, heart racing, presyncope, syncope. Review of 12 Systems is negative except as described above.  Physical Examination   Vitals:BP 134/60  Pulse 54  Ht 179.1 cm (5' 10.5")  Wt 87.2 kg (192 lb 3.9 oz)  SpO2 98%  BMI 27.19 kg/m  Ht:179.1 cm (5' 10.5") Wt:87.2 kg (192 lb 3.9 oz) UKG:URKY surface area is 2.08 meters squared. Body mass index is 27.19 kg/m.  HEENT: Pupils equally reactive to light and accomodation  Neck: Supple without thyromegaly, carotid pulses 2+ Lungs: clear to auscultation bilaterally; no wheezes, rales, rhonchi Heart: Regular rate and rhythm. No gallops, murmurs or rub Abdomen: soft nontender, nondistended, with normal bowel sounds Extremities: no cyanosis, clubbing, or edema Peripheral Pulses: 2+ in all extremities, 2+ femoral pulses bilaterally  Assessment   84 y.o. male with  1. Angina at rest (CMS-HCC)  2. SOB (shortness of breath)  3. Bradycardia  4. Essential hypertension  5. CRI (chronic renal insufficiency), stage 3 (moderate) (CMS-HCC)  6. CHB (complete heart block) (CMS-HCC)   Plan  1 symptomatic  bradycardia possibly complete heart block appears to be a junctional escape rhythm recommend permanent pacemaker 2 hypertension reasonably controlled continue current medical therapy 3 GERD chronic recurrent continue Protonix therapy for reflux symptoms 4 chronic renal sufficiency stage III have the patient follow-up with nephrology 5 shortness of breath dyspnea recommend echocardiogram for further assessment 6 abnormal EKG possible anginal equivalent Lexiscan Myoview as part of preoperative assessment for permanent pacemaker 7 referred patient to Dr. Saralyn Pilar for single-lead pacemaker 8 have the patient follow-up  pacemaker  No follow-ups on file.  DWAYNE Prince [place], Castro  This dictation was prepared with dragon dictation. Any transcription errors that result from this process are unintentional.   Electronically signed by Jose Gibbon, Castro at 06/28/2018 5:03 PM EDT  Plan of Treatment - documented as of this encounter  Upcoming Encounters Upcoming Encounters  Date Type Specialty Care Team Description  08/04/2018 Ancillary Procedure Cardiology Jose Gibbon, Castro  [place] [employer], [place] [address]  [phone]  [phone] (Fax)     Scheduled Orders Scheduled Orders  Name Type Priority Associated Diagnoses Order Schedule  Echo complete Echocardiography Routine Angina at rest [facility])  SOB (shortness of breath)  1 Occurrences starting 06/28/2018 until 06/29/2019  ECG stress test only ECG Routine Angina at rest [facility])  SOB (shortness of breath)  1 Occurrences starting 06/28/2018 until 06/29/2019  Procedures - documented in this encounter  Procedure Name Priority Date/Time Associated Diagnosis Comments  EXTERNAL RADIOLOGY RESULT - CHEST  07/06/2018 12:00 AM EDT  Results for this procedure are in the results section.   ECG  06/25/2018 12:00 AM EDT  Results for this procedure are in the results section.   Imaging Results - documented in this encounter  Table of Contents for Imaging Results  EXTERNAL RADIOLOGY RESULT - CHEST (07/06/2018 12:00 AM EDT)  NM myocardial perfusion SPECT multiple (stress and rest) (07/05/2018 2:00 PM EDT)     EXTERNAL RADIOLOGY RESULT - CHEST (07/06/2018 12:00 AM EDT) EXTERNAL RADIOLOGY RESULT - CHEST (07/06/2018 12:00 AM EDT)  Narrative Performed At  This result has an attachment that is not available.  Ordered by an unspecified provider.      Back to top of Imaging Results    NM myocardial perfusion SPECT multiple (stress and rest) (07/05/2018 2:00 PM EDT) NM myocardial  perfusion SPECT multiple (stress and rest) (07/05/2018 2:00 PM EDT)  Specimen     NM myocardial perfusion SPECT multiple (stress and rest) (07/05/2018 2:00 PM EDT)  Impressions Performed At  Normal myocardial perfusion scan no evidence of stress-induced   myocardial-ischemia ejection fraction of 61% conclusion negative scan          NM myocardial perfusion SPECT multiple (stress and rest) (07/05/2018 2:00 PM EDT)  Narrative Performed At  This result has an attachment that is not available.  [facility] [address] [place], [place] [phone]  Procedure: Pharmacologic Myocardial Perfusion Imaging ONE day procedure  Indication: Angina at rest (CMS-HCC) Plan: NM myocardial perfusion SPECT multiple (stress  and rest), ECG stress test only  SOB (shortness of breath) Plan: NM myocardial perfusion SPECT multiple (stress  and rest), ECG stress test only  Ordering Physician:   Dr. Lujean Amel   Clinical History: 84 y.o. year old male recent anginal symptoms Vitals: Height: 71 in Weight: 192 lb Cardiac risk factors include:  Hyperlipidemia    Procedure:  Pharmacologic stress testing was performed with Regadenoson using  a single  use 0.4mg /58ml (0.08 mg/ml) prefilled syringe intravenously infused as a  bolus dose. The stress test was stopped due to Infusion completion.Blood  pressure response was normal. The patient did not develop any symptoms  other than fatigue during the procedure.   Rest HR: 48bpm Rest BP: 122/96mmHg Max HR: 58bpm Min BP: 124/98mmHg  Stress Test Administered by: Oswald Hillock, CMA  ECG Interpretation: Rest VOZ:DGUYQI sinus rhythm, right bundle branch block (RBBB) Stress HKV:QQVZDG sinus rhythm, RBBB Recovery LOV:FIEPPI sinus rhythm ECG Interpretation:non-diagnostic due to pharmacologic testing.   Administrations This Visit regadenoson  (LEXISCAN) 0.4 mg/5 mL inj syringe 0.4 mg Admin Date 07/05/2018 Action Given Dose 0.4 mg Route Intravenous Administered By Herbert Seta, CNMT    technetium Tc58m sestamibi (CARDIOLITE) injection 95.18 millicurie Admin Date 84/16/6063 Action Given Dose 01.60 millicurie Route Intravenous Administered By Herbert Seta, CNMT    technetium Tc110m sestamibi (CARDIOLITE) injection 10.93 millicurie Admin Date 23/55/7322 Action Given Dose 02.54 millicurie Route Intravenous Administered By Herbert Seta, CNMT       Gated post-stress perfusion imaging was performed 30 minutes after stress.  Rest images were performed 30 minutes after injection.  Gated LV Analysis:  TID Ratio: 0.92  LVEF= 61 %  FINDINGS: Regional wall motion:reveals normal myocardial thickening and wall  motion. The overall quality of the study is good. Artifacts noted: no Left ventricular cavity: normal.  Perfusion Analysis:SPECT images demonstrate homogeneous tracer  distribution throughout the myocardium.       Back to top of Imaging Results ECG Results - documented in this encounter   ECG (06/25/2018 12:00 AM EDT) ECG (06/25/2018 12:00 AM EDT)  Narrative Performed At  This result has an attachment that is not available.  Ordered by an unspecified provider.      Visit Diagnoses - documented in this encounter  Diagnosis  Angina at rest (CMS-HCC) - Primary  Other and unspecified angina pectoris   SOB (shortness of breath)  Shortness of breath   Bradycardia  Other specified cardiac dysrhythmias   Essential hypertension   CRI (chronic renal insufficiency), stage 3 (moderate) (CMS-HCC)   CHB (complete heart block) (CMS-HCC)  Atrioventricular block, complete   Images Patient Contacts   Contact Name Contact Address Communication Relationship to Patient  Latrell Potempa Unknown [phone] [facility]) Son or Daughter, Emergency Contact  Ames Dura Unknown  [phone] [facility]) Son or Daughter, Emergency Contact  Document Information  Primary Care Provider Other Service Providers Document Coverage Dates  Jose Castro (Jul. 29, 2019July 29, 2019 - Present) DM: [phone] (Fax) [place] [address] [facility]  [facility]  Jul. 29, 2019July 29, 2019   [place] [address] [place], [place] [address]   Encounter Providers Encounter Date  Dwayne Aida Raider, Castro (Attending) [phone] (Work) [phone] (Fax) [place], [place] [address] Cardiovascular Disease Jul. 29, 2019July 29, 2019    Show All Sections

## 2018-07-13 NOTE — Anesthesia Preprocedure Evaluation (Addendum)
Anesthesia Evaluation  Patient identified by MRN, date of birth, ID band Patient awake    Reviewed: Allergy & Precautions, NPO status , Patient's Chart, lab work & pertinent test results, reviewed documented beta blocker date and time   Airway Mallampati: II  TM Distance: >3 FB     Dental  (+) Chipped, Partial Lower, Partial Upper   Pulmonary former smoker,           Cardiovascular hypertension, Pt. on medications + Peripheral Vascular Disease  + dysrhythmias      Neuro/Psych    GI/Hepatic GERD  Controlled,  Endo/Other    Renal/GU Renal disease     Musculoskeletal  (+) Arthritis ,   Abdominal   Peds  Hematology   Anesthesia Other Findings   Reproductive/Obstetrics                            Anesthesia Physical Anesthesia Plan  ASA: III  Anesthesia Plan: MAC   Post-op Pain Management:    Induction: Intravenous  PONV Risk Score and Plan:   Airway Management Planned:   Additional Equipment:   Intra-op Plan:   Post-operative Plan:   Informed Consent: I have reviewed the patients History and Physical, chart, labs and discussed the procedure including the risks, benefits and alternatives for the proposed anesthesia with the patient or authorized representative who has indicated his/her understanding and acceptance.     Plan Discussed with: CRNA  Anesthesia Plan Comments:         Anesthesia Quick Evaluation

## 2018-07-13 NOTE — Anesthesia Post-op Follow-up Note (Signed)
Anesthesia QCDR form completed.        

## 2018-07-14 ENCOUNTER — Encounter: Payer: Self-pay | Admitting: Nurse Practitioner

## 2018-07-14 ENCOUNTER — Ambulatory Visit: Payer: Medicare Other | Admitting: Nurse Practitioner

## 2018-07-14 VITALS — BP 112/50 | HR 65 | Temp 98.3°F | Resp 16 | Ht 70.0 in | Wt 190.3 lb

## 2018-07-14 DIAGNOSIS — R300 Dysuria: Secondary | ICD-10-CM

## 2018-07-14 DIAGNOSIS — R809 Proteinuria, unspecified: Secondary | ICD-10-CM

## 2018-07-14 DIAGNOSIS — I495 Sick sinus syndrome: Secondary | ICD-10-CM | POA: Diagnosis not present

## 2018-07-14 LAB — POCT URINALYSIS DIPSTICK
Appearance: NORMAL
Bilirubin, UA: NEGATIVE
Blood, UA: NEGATIVE
Glucose, UA: NEGATIVE
Ketones, UA: NEGATIVE
Leukocytes, UA: NEGATIVE
Nitrite, UA: NEGATIVE
Protein, UA: POSITIVE — AB
Spec Grav, UA: 1.01
Urobilinogen, UA: 0.2 U/dL
pH, UA: 6

## 2018-07-14 MED ORDER — CLARITHROMYCIN 250 MG PO TABS: 250.0000 mg | ORAL_TABLET | Freq: Two times a day (BID) | ORAL | 0 refills | Status: AC

## 2018-07-14 NOTE — Discharge Summary (Signed)
Physician Discharge Summary  Patient ID: Jose Castro MRN: [id number] DOB/AGE: Apr 02, 1926 84 y.o.  Admit date: 07/13/2018 Discharge date: 07/14/2018  Primary Discharge Diagnosis complete heart block Secondary Discharge Diagnosis bradycardia  Significant Diagnostic Studies: yes  Consults: None  Hospital Course: The patient underwent successful permanent pacemaker implantation on 7/78/2423 without complication.  Patient had an uncomplicated hospital course.  Postoperative ECG revealed atrial sensing with ventricular pacing.  Postoperative chest x-ray did not reveal evidence for pneumothorax.  On the morning of 07/14/2018, patient was doing well, ambulating without difficulty and was discharged home.   Discharge Exam: Blood pressure (!) 163/103, pulse 60, temperature 97.9 F (36.6 C), resp. rate 18, height 5' 10.5" (1.791 m), weight 85 kg, SpO2 97 %.   General appearance: alert Head: Normocephalic, without obvious abnormality, atraumatic Eyes: conjunctivae/corneas clear. PERRL, EOM's intact. Fundi benign. Ears: normal TM's and external ear canals both ears Nose: Nares normal. Septum midline. Mucosa normal. No drainage or sinus tenderness. Throat: lips, mucosa, and tongue normal; teeth and gums normal Neck: no adenopathy, no carotid bruit, no JVD, supple, symmetrical, trachea midline and thyroid not enlarged, symmetric, no tenderness/mass/nodules Back: symmetric, no curvature. ROM normal. No CVA tenderness. Resp: clear to auscultation bilaterally Chest wall: no tenderness, Pacemaker site appear to be well-healing Cardio: regular rate and rhythm, S1, S2 normal, no murmur, click, rub or gallop GI: soft, non-tender; bowel sounds normal; no masses,  no organomegaly Extremities: extremities normal, atraumatic, no cyanosis or edema Pulses: 2+ and symmetric Skin: Skin color, texture, turgor normal. No rashes or lesions Labs:   Lab Results  Component Value Date   WBC 8.7 07/07/2018   HGB 13.2 07/07/2018   HCT 39.9 07/07/2018   MCV 91 07/07/2018   PLT 296 07/07/2018   No results for input(s): NA, K, CL, CO2, BUN, CREATININE, CALCIUM, PROT, BILITOT, ALKPHOS, ALT, AST, GLUCOSE in the last 168 hours.  Invalid input(s): LABALBU    Radiology: No pneumothorax EKG: Atrial sensing with ventricular pacing  FOLLOW UP PLANS AND APPOINTMENTS  Allergies as of 07/14/2018      Reactions   Penicillins Itching, Swelling   Has patient had a PCN reaction causing immediate rash, facial/tongue/throat swelling, SOB or lightheadedness with hypotension: No Has patient had a PCN reaction causing severe rash involving mucus membranes or skin necrosis: No Has patient had a PCN reaction that required hospitalization: No Has patient had a PCN reaction occurring within the last 10 years: No If all of the above answers are "NO", then may proceed with Cephalosporin use.   Gabapentin Hives   GI upset.      Medication List    TAKE these medications   brimonidine 0.2 % ophthalmic solution Commonly known as:  ALPHAGAN Place 1 drop into both eyes 2 (two) times daily.   CIALIS 5 MG tablet Generic drug:  tadalafil Take 5 mg by mouth daily as needed for erectile dysfunction.   clarithromycin 250 MG tablet Commonly known as:  BIAXIN Take 1 tablet (250 mg total) by mouth 2 (two) times daily for 14 days.   finasteride 5 MG tablet Commonly known as:  PROSCAR Take 5 mg by mouth daily.   Fish Oil 1000 MG Caps Take 1,000 mg by mouth daily.   lisinopril 10 MG tablet Commonly known as:  PRINIVIL,ZESTRIL TAKE 1 TABLET BY MOUTH DAILY   pantoprazole 40 MG tablet Commonly known as:  PROTONIX Take 1 tablet (40 mg total) by mouth daily.   ranitidine 150 MG capsule Commonly  known as:  ZANTAC Take 150 mg by mouth 2 (two) times daily as needed for heartburn.   Vitamin D3 2000 units Tabs Take 2,000 Units by mouth daily.      Follow-up Information    Callwood, Dwayne D, MD Follow up in 1  week(s).   Specialties:  Cardiology, Internal Medicine Contact information: [place] [address] [phone]           BRING ALL MEDICATIONS WITH YOU TO FOLLOW UP APPOINTMENTS  Time spent with patient to include physician time: 25 minutes Signed:  Isaias Cowman MD, PhD, [facility] 07/14/2018, 8:14 AM

## 2018-07-14 NOTE — Care Management Obs Status (Signed)
[facility] NOTIFICATION   Patient Details  Name: ABDULAHAD Castro MRN: [id number] Date of Birth: 23-Jun-1926   Medicare Observation Status Notification Given:  No Discharge order placed in < 24hr of being placed on observation    Katrina Stack, RN 07/14/2018, 8:28 AM

## 2018-07-14 NOTE — Progress Notes (Addendum)
Name: Jose Castro   MRN: [id number]    DOB: [birth year]   Date:07/14/2018       Progress Note  Subjective  Chief Complaint  Chief Complaint  Patient presents with  . Dysuria    Onset-Today, Frequency and burning during urination.    HPI  Was discharged today from pacemaker placement yesterday. Unknown if catheter was placed but family believes that was not the case. Was using the bathroom fine before he left the hospital and now is noting that he has lower abdominal pressure and urinary frequency and some hesitancy when initiating flow and dysuria. Normally doesn't drink water well but today drank plenty of water had some coffee and other caffinated beverages and prune juice.  During visit patient went to urinate states no dysuria or hesitancy this time.  No fevers or chills, no chest pain, weakness, dizziness or lightheadedness, no rectal pain.   Patient Active Problem List   Diagnosis Date Noted  . 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 Chronic kidney disease, stage III (moderate) ([place]) 06/15/2018  . Leg pain 05/06/2018  . Lymphedema 05/06/2018  . Coagulopathy ([place]) 07/07/2017  . Lumbar spondylosis 03/10/2017  . Elevated uric acid in blood 11/20/2016  . BPH (benign prostatic hyperplasia) 05/29/2016  . Peripheral vascular disease of lower extremity ([place]) 03/03/2016  . Gallstone 12/31/2015  . 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 Atherosclerosis of aorta ([place]) 11/22/2015  . Carotid artery narrowing 11/22/2015  . Diverticulosis of colon 11/22/2015  . Decreased creatinine clearance 11/22/2015  . Arthritis, degenerative 11/22/2015  . Lactose intolerance 11/22/2015  . Basal cell carcinoma 11/22/2015  . Essential hypertension 06/21/2015  . Hyperlipemia 06/21/2015  . Gastroesophageal reflux disease with esophagitis 06/21/2015  . Calculus of kidney 11/18/2013    Past Medical History:  Diagnosis Date  . Arthritis   . BPH (benign  prostatic hyperplasia)   . Chronic kidney disease    had a kidney stone which per family was a cyst that was removed  . Dysrhythmia   . GERD (gastroesophageal reflux disease)   . History of kidney stones   . Hyperlipidemia   . Hypertension   . Splenic vein thrombosis   . Thrombosis 12/2015   mural  area and no notation of heart attack    Past Surgical History:  Procedure Laterality Date  . APPENDECTOMY    . KIDNEY STONE SURGERY    . KNEE SURGERY    . PACEMAKER INSERTION N/A 07/13/2018   Procedure: INSERTION PACEMAKER-DUEL CHAMBER INITIAL IMPLANT;  Surgeon: Isaias Cowman, MD;  Location: [facility] ORS;  Service: Cardiovascular;  Laterality: N/A;  . SPINE SURGERY      Social History   Tobacco Use  . Smoking status: Former Smoker    Years: 16.00    Types: Cigarettes    Start date: 10/21/1944    Last attempt to quit: 10/21/1960    Years since quitting: 57.7  . Smokeless tobacco: Never Used  Substance Use Topics  . Alcohol use: Yes    Alcohol/week: 3.0 standard drinks    Types: 3 Standard drinks or equivalent per week    Comment: once a week when he goes out to dinner-socially     Current Outpatient Medications:  .  brimonidine (ALPHAGAN) 0.2 % ophthalmic solution, Place 1 drop into both eyes 2 (two) times daily. , Disp: , Rfl:  .  Cholecalciferol (VITAMIN D3) 2000 units TABS, Take 2,000 Units by mouth daily. , Disp: ,  Rfl:  .  CIALIS 5 MG tablet, Take 5 mg by mouth daily as needed for erectile dysfunction. , Disp: , Rfl:  .  clarithromycin (BIAXIN) 250 MG tablet, Take 1 tablet (250 mg total) by mouth 2 (two) times daily for 14 days., Disp: 20 tablet, Rfl: 0 .  finasteride (PROSCAR) 5 MG tablet, Take 5 mg by mouth daily. , Disp: , Rfl:  .  lisinopril (PRINIVIL,ZESTRIL) 10 MG tablet, TAKE 1 TABLET BY MOUTH DAILY, Disp: 30 tablet, Rfl: 0 .  Omega-3 Fatty Acids (FISH OIL) 1000 MG CAPS, Take 1,000 mg by mouth daily. , Disp: , Rfl:  .  pantoprazole (PROTONIX) 40 MG tablet, Take  1 tablet (40 mg total) by mouth daily., Disp: 30 tablet, Rfl: 0 .  ranitidine (ZANTAC) 150 MG capsule, Take 150 mg by mouth 2 (two) times daily as needed for heartburn. , Disp: , Rfl:   Allergies  Allergen Reactions  . Penicillins Itching and Swelling    Has patient had a PCN reaction causing immediate rash, facial/tongue/throat swelling, SOB or lightheadedness with hypotension: No Has patient had a PCN reaction causing severe rash involving mucus membranes or skin necrosis: No Has patient had a PCN reaction that required hospitalization: No Has patient had a PCN reaction occurring within the last 10 years: No If all of the above answers are "NO", then may proceed with Cephalosporin use.   . Gabapentin Hives    GI upset.    ROS   No other specific complaints in a complete review of systems (except as listed in HPI above).  Objective  Vitals:   07/14/18 1608  BP: (!) 112/50  Pulse: 65  Resp: 16  Temp: 98.3 F (36.8 C)  TempSrc: Oral  SpO2: 96%  Weight: 190 lb 4.8 oz (86.3 kg)  Height: 5\' 10"  (1.778 m)    Body mass index is 27.31 kg/m.  Nursing Note and Vital Signs reviewed.  Physical Exam  Constitutional: He appears well-developed and well-nourished. No distress.  Cardiovascular: Intact distal pulses.  Pacemaker dressing well maintained, no bleeding   Pulmonary/Chest: Breath sounds normal.  Abdominal: Soft. Bowel sounds are normal. There is no tenderness (no suprapubic pain but notes has urgency with pressure).  No CVA tenderness   Skin: Skin is warm and dry. He is not diaphoretic.  Psychiatric: He has a normal mood and affect. His behavior is normal. Judgment and thought content normal.       Results for orders placed or performed in visit on 07/14/18 (from the past 48 hour(s))  POCT Urinalysis Dipstick     Status: Abnormal   Collection Time: 07/14/18  4:09 PM  Result Value Ref Range   Color, UA Yellow    Clarity, UA Clear    Glucose, UA Negative  Negative   Bilirubin, UA neg    Ketones, UA neg    Spec Grav, UA 1.010 1.010 - 1.025   Blood, UA neg    pH, UA 6.0 5.0 - 8.0   Protein, UA Positive (A) Negative    Comment: 2+   Urobilinogen, UA 0.2 0.2 or 1.0 E.U./dL   Nitrite, UA negative    Leukocytes, UA Negative Negative   Appearance Normal    Odor Slight     Assessment & Plan 1. Dysuria Drink plenty of water avoid caffeine. Pending urine culture- will call, see urology if needed, discussed warning signs.  - POCT Urinalysis Dipstick - Urine Culture  2. Proteinuria, unspecified type Takes ACEi for nephroprotection, plan  to follow-up with nephrology but will re-check kidney function in 2-4 days to see if improvement with hydration.  - BASIC METABOLIC PANEL WITH GFR; Future - Ambulatory referral to Nephrology   -Red flags and when to present for emergency care or RTC including fever >101.58F, weakness, lower back pain, lightheadedness or dizziness or  new/worsening/un-resolving symptoms, reviewed with patient at time of visit. Follow up and care instructions discussed and provided in AVS.  ----------------------------------- I have reviewed this encounter including the documentation in this note and/or discussed this patient with the provider, Suezanne Cheshire DNP AGNP-C. I am certifying that I agree with the content of this note as supervising physician. Enid Derry, [facility] 07/21/2018, 5:14 PM

## 2018-07-14 NOTE — Patient Instructions (Addendum)
-   Urine still shows protein, but no signs of infection. Will send it to the labs to culture and call you guys about the results - Drink plenty of water and please avoid caffeine.  - Come back between 8am- 4pm  On Friday or Monday.

## 2018-07-14 NOTE — Discharge Instructions (Signed)
Remove outer bandage on 07/19/2018, leave Steri-Strips on.  Patient may shower on 07/18/2018.  Do not lift left arm above head.

## 2018-07-16 ENCOUNTER — Encounter: Payer: Self-pay | Admitting: Nurse Practitioner

## 2018-07-16 ENCOUNTER — Ambulatory Visit: Payer: Medicare Other | Admitting: Nurse Practitioner

## 2018-07-16 VITALS — BP 130/70 | HR 60 | Temp 98.3°F | Resp 16 | Wt 187.7 lb

## 2018-07-16 DIAGNOSIS — R300 Dysuria: Secondary | ICD-10-CM

## 2018-07-16 DIAGNOSIS — R801 Persistent proteinuria, unspecified: Secondary | ICD-10-CM

## 2018-07-16 DIAGNOSIS — R350 Frequency of micturition: Secondary | ICD-10-CM

## 2018-07-16 LAB — POCT URINALYSIS DIPSTICK
Appearance: NORMAL
Bilirubin, UA: NEGATIVE
Glucose, UA: NEGATIVE
Ketones, UA: NEGATIVE
Leukocytes, UA: NEGATIVE
NITRITE UA: NEGATIVE
PH UA: 5 (ref 5.0–8.0)
Protein, UA: POSITIVE — AB
RBC UA: NEGATIVE
SPEC GRAV UA: 1.015 (ref 1.010–1.025)
Urobilinogen, UA: 0.2 E.U./dL

## 2018-07-16 LAB — URINE CULTURE
MICRO NUMBER: 90973250
RESULT: NO GROWTH
SPECIMEN QUALITY:: ADEQUATE

## 2018-07-16 MED ORDER — TAMSULOSIN HCL 0.4 MG PO CAPS: 0.4000 mg | ORAL_CAPSULE | Freq: Every day | ORAL | 0 refills | Status: DC

## 2018-07-16 NOTE — Progress Notes (Deleted)
Name: Jose Castro   MRN: [id number]    DOB: 1926-03-26   Date:07/16/2018       Progress Note  Subjective  Chief Complaint  No chief complaint on file.   HPI  ***  Patient Active Problem List   Diagnosis Date Noted  . 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 Chronic kidney disease, stage III (moderate) ([place]) 06/15/2018  . Leg pain 05/06/2018  . Lymphedema 05/06/2018  . Coagulopathy ([place]) 07/07/2017  . Lumbar spondylosis 03/10/2017  . Elevated uric acid in blood 11/20/2016  . BPH (benign prostatic hyperplasia) 05/29/2016  . Peripheral vascular disease of lower extremity ([place]) 03/03/2016  . Gallstone 12/31/2015  . 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 Atherosclerosis of aorta ([place]) 11/22/2015  . Carotid artery narrowing 11/22/2015  . Diverticulosis of colon 11/22/2015  . Decreased creatinine clearance 11/22/2015  . Arthritis, degenerative 11/22/2015  . Lactose intolerance 11/22/2015  . Basal cell carcinoma 11/22/2015  . Essential hypertension 06/21/2015  . Hyperlipemia 06/21/2015  . Gastroesophageal reflux disease with esophagitis 06/21/2015  . Calculus of kidney 11/18/2013    Past Medical History:  Diagnosis Date  . Arthritis   . BPH (benign prostatic hyperplasia)   . Chronic kidney disease    had a kidney stone which per family was a cyst that was removed  . Dysrhythmia   . GERD (gastroesophageal reflux disease)   . History of kidney stones   . Hyperlipidemia   . Hypertension   . Splenic vein thrombosis   . Thrombosis 12/2015   mural  area and no notation of heart attack    Past Surgical History:  Procedure Laterality Date  . APPENDECTOMY    . KIDNEY STONE SURGERY    . KNEE SURGERY    . PACEMAKER INSERTION N/A 07/13/2018   Procedure: INSERTION PACEMAKER-DUEL CHAMBER INITIAL IMPLANT;  Surgeon: Isaias Cowman, MD;  Location: [facility] ORS;  Service: Cardiovascular;  Laterality: N/A;  . SPINE SURGERY      Social  History   Tobacco Use  . Smoking status: Former Smoker    Years: 16.00    Types: Cigarettes    Start date: 10/21/1944    Last attempt to quit: 10/21/1960    Years since quitting: 57.7  . Smokeless tobacco: Never Used  Substance Use Topics  . Alcohol use: Yes    Alcohol/week: 3.0 standard drinks    Types: 3 Standard drinks or equivalent per week    Comment: once a week when he goes out to dinner-socially     Current Outpatient Medications:  .  brimonidine (ALPHAGAN) 0.2 % ophthalmic solution, Place 1 drop into both eyes 2 (two) times daily. , Disp: , Rfl:  .  Cholecalciferol (VITAMIN D3) 2000 units TABS, Take 2,000 Units by mouth daily. , Disp: , Rfl:  .  CIALIS 5 MG tablet, Take 5 mg by mouth daily as needed for erectile dysfunction. , Disp: , Rfl:  .  clarithromycin (BIAXIN) 250 MG tablet, Take 1 tablet (250 mg total) by mouth 2 (two) times daily for 14 days., Disp: 20 tablet, Rfl: 0 .  finasteride (PROSCAR) 5 MG tablet, Take 5 mg by mouth daily. , Disp: , Rfl:  .  lisinopril (PRINIVIL,ZESTRIL) 10 MG tablet, TAKE 1 TABLET BY MOUTH DAILY, Disp: 30 tablet, Rfl: 0 .  Omega-3 Fatty Acids (FISH OIL) 1000 MG CAPS, Take 1,000 mg by mouth daily. , Disp: , Rfl:  .  pantoprazole (PROTONIX) 40 MG tablet,  Take 1 tablet (40 mg total) by mouth daily., Disp: 30 tablet, Rfl: 0 .  ranitidine (ZANTAC) 150 MG capsule, Take 150 mg by mouth 2 (two) times daily as needed for heartburn. , Disp: , Rfl:   Allergies  Allergen Reactions  . Penicillins Itching and Swelling    Has patient had a PCN reaction causing immediate rash, facial/tongue/throat swelling, SOB or lightheadedness with hypotension: No Has patient had a PCN reaction causing severe rash involving mucus membranes or skin necrosis: No Has patient had a PCN reaction that required hospitalization: No Has patient had a PCN reaction occurring within the last 10 years: No If all of the above answers are "NO", then may proceed with Cephalosporin  use.   . Gabapentin Hives    GI upset.    ROS  ***  No other specific complaints in a complete review of systems (except as listed in HPI above).  Objective  There were no vitals filed for this visit. ***  There is no height or weight on file to calculate BMI.  Nursing Note and Vital Signs reviewed.  Physical Exam  ***   Results for orders placed or performed in visit on 07/14/18 (from the past 48 hour(s))  POCT Urinalysis Dipstick     Status: Abnormal   Collection Time: 07/14/18  4:09 PM  Result Value Ref Range   Color, UA Yellow    Clarity, UA Clear    Glucose, UA Negative Negative   Bilirubin, UA neg    Ketones, UA neg    Spec Grav, UA 1.010 1.010 - 1.025   Blood, UA neg    pH, UA 6.0 5.0 - 8.0   Protein, UA Positive (A) Negative    Comment: 2+   Urobilinogen, UA 0.2 0.2 or 1.0 E.U./dL   Nitrite, UA negative    Leukocytes, UA Negative Negative   Appearance Normal    Odor Slight     Assessment & Plan  There are no diagnoses linked to this encounter.   -Red flags and when to present for emergency care or RTC including fever >101.54F, chest pain, shortness of breath, new/worsening/un-resolving symptoms, *** reviewed with patient at time of visit. Follow up and care instructions discussed and provided in AVS. -Reviewed Health Maintenance: ***

## 2018-07-16 NOTE — Patient Instructions (Addendum)
-   Getting BMP today will check kidney function to see if improved so we can try some other medication to help with symptoms until you can get in with urology. -  Getting in with urology within the next week. - Drink plenty of water in the day (64 ounces) avoid drinking anything 2-3 hours before you go to bed.  - Can take tylenol for pain ( no more than 3,000mg / 24 hours)

## 2018-07-16 NOTE — Progress Notes (Addendum)
Name: Jose Castro   MRN: [id number]    DOB: [birth year]   Date:07/16/2018       Progress Note  Subjective  Chief Complaint  Chief Complaint  Patient presents with  . Urinary Frequency    HPI  Patient states since pacemaker placement has been having urinary frequency and pain when he starts peeing states large amounts of urine during the day and more frequent at night- states got up about 15 times last night. States when he has to pee it takes a few seconds to start; states sometimes was weak stream and sometimes strong, states occassionally has incomplete bladder emptying. Has left lower back pain   No penile discharge, no hematuria, purulent urethral discharge with fever, chills,  myalgia, arthralgia, no sores on penis, no leg weakness, no injuries of falls, no polydipsia.       Patient Active Problem List   Diagnosis Date Noted  . 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 Chronic kidney disease, stage III (moderate) ([place]) 06/15/2018  . Leg pain 05/06/2018  . Lymphedema 05/06/2018  . Coagulopathy ([place]) 07/07/2017  . Lumbar spondylosis 03/10/2017  . Elevated uric acid in blood 11/20/2016  . BPH (benign prostatic hyperplasia) 05/29/2016  . Peripheral vascular disease of lower extremity ([place]) 03/03/2016  . Gallstone 12/31/2015  . 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 Atherosclerosis of aorta ([place]) 11/22/2015  . Carotid artery narrowing 11/22/2015  . Diverticulosis of colon 11/22/2015  . Decreased creatinine clearance 11/22/2015  . Arthritis, degenerative 11/22/2015  . Lactose intolerance 11/22/2015  . Basal cell carcinoma 11/22/2015  . Essential hypertension 06/21/2015  . Hyperlipemia 06/21/2015  . Gastroesophageal reflux disease with esophagitis 06/21/2015  . Calculus of kidney 11/18/2013    Past Medical History:  Diagnosis Date  . Arthritis   . BPH (benign prostatic hyperplasia)   . Chronic kidney disease    had a kidney  stone which per family was a cyst that was removed  . Dysrhythmia   . GERD (gastroesophageal reflux disease)   . History of kidney stones   . Hyperlipidemia   . Hypertension   . Splenic vein thrombosis   . Thrombosis 12/2015   mural  area and no notation of heart attack    Past Surgical History:  Procedure Laterality Date  . APPENDECTOMY    . KIDNEY STONE SURGERY    . KNEE SURGERY    . PACEMAKER INSERTION N/A 07/13/2018   Procedure: INSERTION PACEMAKER-DUEL CHAMBER INITIAL IMPLANT;  Surgeon: Isaias Cowman, MD;  Location: [facility] ORS;  Service: Cardiovascular;  Laterality: N/A;  . SPINE SURGERY      Social History   Tobacco Use  . Smoking status: Former Smoker    Years: 16.00    Types: Cigarettes    Start date: 10/21/1944    Last attempt to quit: 10/21/1960    Years since quitting: 57.7  . Smokeless tobacco: Never Used  Substance Use Topics  . Alcohol use: Yes    Alcohol/week: 3.0 standard drinks    Types: 3 Standard drinks or equivalent per week    Comment: once a week when he goes out to dinner-socially     Current Outpatient Medications:  .  brimonidine (ALPHAGAN) 0.2 % ophthalmic solution, Place 1 drop into both eyes 2 (two) times daily. , Disp: , Rfl:  .  Cholecalciferol (VITAMIN D3) 2000 units TABS, Take 2,000 Units by mouth daily. , Disp: , Rfl:  .  CIALIS 5 MG  tablet, Take 5 mg by mouth daily as needed for erectile dysfunction. , Disp: , Rfl:  .  clarithromycin (BIAXIN) 250 MG tablet, Take 1 tablet (250 mg total) by mouth 2 (two) times daily for 14 days., Disp: 20 tablet, Rfl: 0 .  finasteride (PROSCAR) 5 MG tablet, Take 5 mg by mouth daily. , Disp: , Rfl:  .  lisinopril (PRINIVIL,ZESTRIL) 10 MG tablet, TAKE 1 TABLET BY MOUTH DAILY, Disp: 30 tablet, Rfl: 0 .  Omega-3 Fatty Acids (FISH OIL) 1000 MG CAPS, Take 1,000 mg by mouth daily. , Disp: , Rfl:  .  pantoprazole (PROTONIX) 40 MG tablet, Take 1 tablet (40 mg total) by mouth daily., Disp: 30 tablet, Rfl: 0 .   ranitidine (ZANTAC) 150 MG capsule, Take 150 mg by mouth 2 (two) times daily as needed for heartburn. , Disp: , Rfl:   Allergies  Allergen Reactions  . Penicillins Itching and Swelling    Has patient had a PCN reaction causing immediate rash, facial/tongue/throat swelling, SOB or lightheadedness with hypotension: No Has patient had a PCN reaction causing severe rash involving mucus membranes or skin necrosis: No Has patient had a PCN reaction that required hospitalization: No Has patient had a PCN reaction occurring within the last 10 years: No If all of the above answers are "NO", then may proceed with Cephalosporin use.   . Gabapentin Hives    GI upset.    ROS  No other specific complaints in a complete review of systems (except as listed in HPI above).  Objective  Vitals:   07/16/18 1451  BP: 130/70  Pulse: 60  Resp: 16  Temp: 98.3 F (36.8 C)  TempSrc: Oral  Weight: 187 lb 11.2 oz (85.1 kg)     Body mass index is 26.93 kg/m.  Nursing Note and Vital Signs reviewed.  Physical Exam  Constitutional: He is oriented to person, place, and time. He appears well-developed and well-nourished.  Cardiovascular: Normal rate and regular rhythm.  Dressing clean and dry   Pulmonary/Chest: Effort normal and breath sounds normal.  Abdominal: Soft. Bowel sounds are normal.  No CVA tenderness. Urge to urinate with suprapubic pressure- no pain    Musculoskeletal: Normal range of motion.  Neurological: He is alert and oriented to person, place, and time.  Skin: Skin is warm and dry.  Psychiatric: He has a normal mood and affect. His behavior is normal. Judgment and thought content normal.  Nursing note and vitals reviewed.    Results for orders placed or performed in visit on 07/16/18 (from the past 48 hour(s))  POCT Urinalysis Dipstick     Status: Abnormal   Collection Time: 07/16/18  2:55 PM  Result Value Ref Range   Color, UA yellow    Clarity, UA clear    Glucose, UA  Negative Negative   Bilirubin, UA Negative    Ketones, UA Negative    Spec Grav, UA 1.015 1.010 - 1.025   Blood, UA Negative    pH, UA 5.0 5.0 - 8.0   Protein, UA Positive (A) Negative   Urobilinogen, UA 0.2 0.2 or 1.0 E.U./dL   Nitrite, UA Negative    Leukocytes, UA Negative Negative   Appearance normal    Odor yes     Assessment & Plan  1. Dysuria Follow up with urology  - POCT Urinalysis Dipstick - BASIC METABOLIC PANEL WITH GFR - Urine Culture  2. Urinary frequency Follow up with urology  - BASIC METABOLIC PANEL WITH GFR - Urine  Culture - tamsulosin (FLOMAX) 0.4 MG CAPS capsule; Take 1 capsule (0.4 mg total) by mouth daily after breakfast.  Dispense: 14 capsule; Refill: 0  3. Persistent proteinuria - BASIC METABOLIC PANEL WITH GFR  Patient symptoms appear to have increased. He appears non-toxic, no acute distress discussed making appointment for next available with urologist.    -Red flags and when to present for emergency care or RTC including fever >101.61F, chest pain, shortness of breath, new/worsening/un-resolving symptoms,  reviewed with patient at time of visit. Follow up and care instructions discussed and provided in AVS.  ------------------------------- I have reviewed this encounter including the documentation in this note and/or discussed this patient with the provider, Suezanne Cheshire DNP AGNP-C. I am certifying that I agree with the content of this note as supervising physician. Enid Derry, [facility] 07/21/2018, 5:26 PM

## 2018-07-17 ENCOUNTER — Emergency Department: Payer: Medicare Other

## 2018-07-17 ENCOUNTER — Emergency Department
Admission: EM | Admit: 2018-07-17 | Discharge: 2018-07-17 | Disposition: A | Discharge status: Home / Self Care | Payer: Medicare Other | Attending: Emergency Medicine | Admitting: Emergency Medicine

## 2018-07-17 DIAGNOSIS — E860 Dehydration: Secondary | ICD-10-CM | POA: Diagnosis not present

## 2018-07-17 DIAGNOSIS — Z79899 Other long term (current) drug therapy: Secondary | ICD-10-CM | POA: Insufficient documentation

## 2018-07-17 DIAGNOSIS — I10 Essential (primary) hypertension: Secondary | ICD-10-CM | POA: Diagnosis not present

## 2018-07-17 DIAGNOSIS — Z87891 Personal history of nicotine dependence: Secondary | ICD-10-CM | POA: Insufficient documentation

## 2018-07-17 DIAGNOSIS — Z950 Presence of cardiac pacemaker: Secondary | ICD-10-CM | POA: Insufficient documentation

## 2018-07-17 DIAGNOSIS — Z5189 Encounter for other specified aftercare: Secondary | ICD-10-CM | POA: Diagnosis present

## 2018-07-17 LAB — CBC
HCT: 39.5 % — ABNORMAL LOW (ref 40.0–52.0)
Hemoglobin: 13.5 g/dL (ref 13.0–18.0)
MCH: 31.3 pg (ref 26.0–34.0)
MCHC: 34.1 g/dL (ref 32.0–36.0)
MCV: 91.6 fL (ref 80.0–100.0)
PLATELETS: 317 10*3/uL (ref 150–440)
RBC: 4.31 MIL/uL — ABNORMAL LOW (ref 4.40–5.90)
RDW: 13.5 % (ref 11.5–14.5)
WBC: 9.5 10*3/uL (ref 3.8–10.6)

## 2018-07-17 LAB — BASIC METABOLIC PANEL WITH GFR
BUN / CREAT RATIO: 22 (calc) (ref 6–22)
BUN: 28 mg/dL — AB (ref 7–25)
CHLORIDE: 103 mmol/L (ref 98–110)
CO2: 28 mmol/L (ref 20–32)
Calcium: 9.3 mg/dL (ref 8.6–10.3)
Creat: 1.3 mg/dL — ABNORMAL HIGH (ref 0.70–1.11)
GFR, Est African American: 56 mL/min/{1.73_m2} — ABNORMAL LOW (ref >=60)
GFR, Est Non African American: 48 mL/min/{1.73_m2} — ABNORMAL LOW (ref >=60)
GLUCOSE: 83 mg/dL (ref 65–99)
POTASSIUM: 4.8 mmol/L (ref 3.5–5.3)
SODIUM: 138 mmol/L (ref 135–146)

## 2018-07-17 LAB — BASIC METABOLIC PANEL
Anion gap: 11 (ref 5–15)
Anion gap: 9 (ref 5–15)
BUN: 37 mg/dL — ABNORMAL HIGH (ref 8–23)
BUN: 36 mg/dL — AB (ref 8–23)
CALCIUM: 8.8 mg/dL — AB (ref 8.9–10.3)
CO2: 23 mmol/L (ref 22–32)
CO2: 24 mmol/L (ref 22–32)
CREATININE: 2.11 mg/dL — AB (ref 0.61–1.24)
CREATININE: 1.78 mg/dL — AB (ref 0.61–1.24)
Calcium: 8.9 mg/dL (ref 8.9–10.3)
Chloride: 104 mmol/L (ref 98–111)
Chloride: 107 mmol/L (ref 98–111)
GFR calc Af Amer: 30 mL/min — ABNORMAL LOW (ref >60)
GFR calc Af Amer: 37 mL/min — ABNORMAL LOW (ref >60)
GFR, EST NON AFRICAN AMERICAN: 26 mL/min — AB (ref >60)
GFR, EST NON AFRICAN AMERICAN: 32 mL/min — AB (ref >60)
GLUCOSE: 110 mg/dL — AB (ref 70–99)
GLUCOSE: 96 mg/dL (ref 70–99)
POTASSIUM: 4.2 mmol/L (ref 3.5–5.1)
Potassium: 4.1 mmol/L (ref 3.5–5.1)
SODIUM: 140 mmol/L (ref 135–145)
Sodium: 138 mmol/L (ref 135–145)

## 2018-07-17 LAB — TROPONIN I
TROPONIN I: 0.05 ng/mL — AB (ref <0.03)
TROPONIN I: 0.05 ng/mL — AB (ref <0.03)

## 2018-07-17 MED ORDER — SODIUM CHLORIDE 0.9 % IV BOLUS
500.0000 mL | Freq: Once | INTRAVENOUS | Status: AC
Start: 2018-07-17 — End: 2018-07-17
  Administered 2018-07-17: 500 mL via INTRAVENOUS

## 2018-07-17 NOTE — ED Triage Notes (Signed)
Patient reports he had a pacemaker placed 2 days ago. Patient reports this evening he lifted the cover on the back of the toilet bowl up, and he believes the wires for his pacemaker have become displaced. Patient states: "it feels different, like it did before the surgery."

## 2018-07-17 NOTE — ED Notes (Signed)
MD McShane ordered to interrogate pacemaker. Interrogation performed

## 2018-07-17 NOTE — Discharge Instructions (Signed)
Your pacemaker wires look fine please talk to Dr. Saralyn Pilar on Monday return to the emergency room for any new or worrisome symptoms.  You were slightly dehydrated here over your baseline.  Keep your appointment with your kidney doctors and drink plenty of fluids.  If you have any new or worrisome symptoms including chest pain shortness of breath or other concerns return to the emergency department.  Please also follow-up closely with your doctor for recheck of your kidney function on Monday.

## 2018-07-17 NOTE — ED Notes (Signed)
Pt up to BR with steady gait and standby assist.

## 2018-07-17 NOTE — ED Notes (Signed)
Spoke with representative from [employer]. Was informed that due to the patient's heart rate being over 85 beats per minute, an atrial threshold rate could not be performed; if this information is desired, a representative from [employer] would have to be called out to perform this test manually. The number to request a representative is [phone].   Per representative, all other values on the interrogation are normal.

## 2018-07-17 NOTE — ED Notes (Signed)
IV attempted x 1 by [employer].

## 2018-07-17 NOTE — ED Provider Notes (Addendum)
[facility] Emergency Department Provider Note  ____________________________________________   I have reviewed the triage vital signs and the nursing notes. Where available I have reviewed prior notes and, if possible and indicated, outside hospital notes.    HISTORY  Chief Complaint Post-op Problem    HPI Jose Castro is a 84 y.o. 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  He lifted up a toilet seat, not over his head, but this caused to be very worried that perhaps he had dislodged his pacemaker.  He has no particular complaint otherwise.  He feels pretty good.  Has not been eating or drinking very much since he went home but otherwise he is fine.  Family feels that he says baseline.  Patient denies any chest pain shortness of breath nausea or vomiting.  He says convinced that perhaps he is done something to the wires of his pacemaker when he bent over to pick up the toilet seat.  Asked, he states is because he felt weird when he did it.       Past Medical History:  Diagnosis Date  . Arthritis   . BPH (benign prostatic hyperplasia)   . Chronic kidney disease    had a kidney stone which per family was a cyst that was removed  . Dysrhythmia   . GERD (gastroesophageal reflux disease)   . History of kidney stones   . Hyperlipidemia   . Hypertension   . Splenic vein thrombosis   . Thrombosis 12/2015   mural  area and no notation of heart attack    Patient Active Problem List   Diagnosis Date Noted  . 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 Chronic kidney disease, stage III (moderate) ([place]) 06/15/2018  . Leg pain 05/06/2018  . Lymphedema 05/06/2018  . Coagulopathy ([place]) 07/07/2017  . Lumbar spondylosis 03/10/2017  . Elevated uric acid in blood 11/20/2016  . BPH (benign prostatic hyperplasia) 05/29/2016  . Peripheral vascular disease of lower extremity ([place])  03/03/2016  . Gallstone 12/31/2015  . 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 Atherosclerosis of aorta ([place]) 11/22/2015  . Carotid artery narrowing 11/22/2015  . Diverticulosis of colon 11/22/2015  . Decreased creatinine clearance 11/22/2015  . Arthritis, degenerative 11/22/2015  . Lactose intolerance 11/22/2015  . Basal cell carcinoma 11/22/2015  . Essential hypertension 06/21/2015  . Hyperlipemia 06/21/2015  . Gastroesophageal reflux disease with esophagitis 06/21/2015  . Calculus of kidney 11/18/2013    Past Surgical History:  Procedure Laterality Date  . APPENDECTOMY    . KIDNEY STONE SURGERY    . KNEE SURGERY    . PACEMAKER INSERTION N/A 07/13/2018   Procedure: INSERTION PACEMAKER-DUEL CHAMBER INITIAL IMPLANT;  Surgeon: Isaias Cowman, MD;  Location: [facility] ORS;  Service: Cardiovascular;  Laterality: N/A;  . SPINE SURGERY      Prior to Admission medications   Medication Sig Start Date End Date Taking? Authorizing Provider  brimonidine (ALPHAGAN) 0.2 % ophthalmic solution Place 1 drop into both eyes 2 (two) times daily.  06/11/15   [provider]  Cholecalciferol (VITAMIN D3) 2000 units TABS Take 2,000 Units by mouth daily.     [provider]  CIALIS 5 MG tablet Take 5 mg by mouth daily as needed for erectile dysfunction.  06/11/15   [provider]  clarithromycin (BIAXIN) 250 MG tablet Take 1 tablet (250 mg  total) by mouth 2 (two) times daily for 14 days. 07/14/18 07/28/18  Paraschos, Alexander, MD  finasteride (PROSCAR) 5 MG tablet Take 5 mg by mouth daily.  06/11/15   [provider]  lisinopril (PRINIVIL,ZESTRIL) 10 MG tablet TAKE 1 TABLET BY MOUTH DAILY 06/30/18   Steele Sizer, MD  Omega-3 Fatty Acids (FISH OIL) 1000 MG CAPS Take 1,000 mg by mouth daily.     [provider]  pantoprazole (PROTONIX) 40 MG tablet Take 1 tablet (40 mg total) by mouth daily. 07/05/18 07/05/19  Darel Hong, MD   ranitidine (ZANTAC) 150 MG capsule Take 150 mg by mouth 2 (two) times daily as needed for heartburn.     [provider]  tamsulosin (FLOMAX) 0.4 MG CAPS capsule Take 1 capsule (0.4 mg total) by mouth daily after breakfast. 07/16/18   Poulose, Bethel Born, NP    Allergies Penicillins  Family History  Problem Relation Age of Onset  . Cancer Brother        bladder cancer with mets  . Heart disease Mother   . Aortic aneurysm Mother   . Heart attack Maternal Aunt   . Heart attack Maternal Uncle     Social History Social History   Tobacco Use  . Smoking status: Former Smoker    Years: 16.00    Types: Cigarettes    Start date: 10/21/1944    Last attempt to quit: 10/21/1960    Years since quitting: 57.7  . Smokeless tobacco: Never Used  Substance Use Topics  . Alcohol use: Yes    Alcohol/week: 3.0 standard drinks    Types: 3 Standard drinks or equivalent per week    Comment: once a week when he goes out to dinner-socially  . Drug use: No    Review of Systems Constitutional: No fever/chills Eyes: No visual changes. ENT: No sore throat. No stiff neck no neck pain Cardiovascular: Denies chest pain. Respiratory: Denies shortness of breath. Gastrointestinal:   no vomiting.  No diarrhea.  No constipation. Genitourinary: Negative for dysuria. Musculoskeletal: Negative lower extremity swelling Skin: Negative for rash. Neurological: Negative for severe headaches, focal weakness or numbness.   ____________________________________________   PHYSICAL EXAM:  VITAL SIGNS: ED Triage Vitals [07/17/18 0102]  Enc Vitals Group     BP (!) 155/89     Pulse Rate 99     Resp 17     Temp 98 F (36.7 C)     Temp Source Oral     SpO2 96 %     Weight 187 lb 6.3 oz (85 kg)     Height 5\' 10"  (1.778 m)     Head Circumference      Peak Flow      Pain Score 0     Pain Loc      Pain Edu?      Excl. in [place]?     Constitutional: Alert and oriented. Well appearing and in no  acute distress. Eyes: Conjunctivae are normal Head: Atraumatic HEENT: No congestion/rhinnorhea. Mucous membranes are moist.  Oropharynx non-erythematous Neck:   Nontender with no meningismus, no masses, no stridor Cardiovascular: Normal rate, regular rhythm. Grossly normal heart sounds.  Good peripheral circulation. Chest: Pacemaker site does not appear to be infected, nontender Respiratory: Normal respiratory effort.  No retractions. Lungs CTAB. Abdominal: Soft and nontender. No distention. No guarding no rebound Back:  There is no focal tenderness or step off.  there is no midline tenderness there are no lesions noted. there is no  CVA tenderness Musculoskeletal: No lower extremity tenderness, no upper extremity tenderness. No joint effusions, no DVT signs strong distal pulses no edema Neurologic:  Normal speech and language. No gross focal neurologic deficits are appreciated.  Skin:  Skin is warm, dry and intact. No rash noted. Psychiatric: Mood and affect are normal. Speech and behavior are normal.  ____________________________________________   LABS (all labs ordered are listed, but only abnormal results are displayed)  Labs Reviewed  BASIC METABOLIC PANEL - Abnormal; Notable for the following components:      Result Value   Glucose, Bld 110 (*)    BUN 37 (*)    Creatinine, Ser 2.11 (*)    Calcium 8.8 (*)    GFR calc non Af Amer 26 (*)    GFR calc Af Amer 30 (*)    All other components within normal limits  CBC - Abnormal; Notable for the following components:   RBC 4.31 (*)    HCT 39.5 (*)    All other components within normal limits  TROPONIN I - Abnormal; Notable for the following components:   Troponin I 0.05 (*)    All other components within normal limits    Pertinent labs  results that were available during my care of the patient were reviewed by me and considered in my medical decision making (see chart for  details). ____________________________________________  EKG  I personally interpreted any EKGs ordered by me or triage  ____________________________________________  RADIOLOGY  Pertinent labs & imaging results that were available during my care of the patient were reviewed by me and considered in my medical decision making (see chart for details). If possible, patient and/or family made aware of any abnormal findings.  Dg Chest 2 View  Result Date: 07/17/2018 CLINICAL DATA:  Evaluate pacer wires. EXAM: CHEST - 2 VIEW COMPARISON:  Chest radiograph 07/13/2018 FINDINGS: Multi lead pacer apparatus overlies the left hemithorax, leads are stable in position. Stable cardiac and mediastinal contours. Aortic atherosclerosis. No consolidative pulmonary opacities. No pleural effusion or pneumothorax. Thoracic spine degenerative changes. IMPRESSION: No acute cardiopulmonary process.  Stable positioning pacer wires. Electronically Signed   By: Lovey Newcomer M.D.   On: 07/17/2018 01:48   ____________________________________________    PROCEDURES  Procedure(s) performed: None  Procedures  Critical Care performed: None  ____________________________________________   INITIAL IMPRESSION / ASSESSMENT AND PLAN / ED COURSE  Pertinent labs & imaging results that were available during my care of the patient were reviewed by me and considered in my medical decision making (see chart for details).  Patient here with a concern that perhaps he has damaged his pacemaker.  Troponin is borderline but he just had cardiac surgery, in the form of a pacemaker.  We will recheck cardiac enzymes.  Troponin is somewhat elevated he is already being followed for this.  Baseline is between 1.3- 1.5 and is at 2.1, he does appear to be slightly dehydrated, will give him IV fluid.  Troponin is 0.05 and we will recheck that, will also give him a small fluid bolus and if that is okay I think he will be safely home.  He and  family very much prefer to go home.  Patient states as long as the wires look okay on the x-ray he has no complaints or concerns.  And he is continued to look and act well while here.  Of note, patient's pacemaker was also interrogated and per report, there was no evidence of increased impedance or wire problem chest x-ray  also shows no evidence of wire problem.  ----------------------------------------- 5:42 AM on 07/17/2018 -----------------------------------------  Medicare troponin not trending up discussed with his cardiologist Dr. Saralyn Pilar, we discussed all of his findings and complaint he feels comfortable sending the patient home as do i, kidney function is much improved after IV fluid not completely back to baseline family would prefer not to be admitted they will encourage p.o. hydration he will follow-up with his doctor on Monday as well as with his cardiologist.  Return precautions were given and understood.   ____________________________________________   FINAL CLINICAL IMPRESSION(S) / ED DIAGNOSES  Final diagnoses:  None      This chart was dictated using voice recognition software.  Despite best efforts to proofread,  errors can occur which can change meaning.      Schuyler Amor, MD 07/17/18 [id number]    Schuyler Amor, MD 07/17/18 [phone]

## 2018-07-19 ENCOUNTER — Telehealth: Payer: Self-pay

## 2018-07-19 NOTE — Telephone Encounter (Signed)
Called and spoke to patients daughter [name]. They were already at the urologist and was having a cath placed. He will be wearing a urinary cath for 1 week for urinary retention.

## 2018-07-28 ENCOUNTER — Other Ambulatory Visit: Payer: Self-pay | Admitting: Family Medicine

## 2018-07-28 DIAGNOSIS — I10 Essential (primary) hypertension: Secondary | ICD-10-CM

## 2018-07-30 ENCOUNTER — Telehealth: Payer: Self-pay | Admitting: Emergency Medicine

## 2018-07-30 ENCOUNTER — Encounter: Payer: Self-pay | Admitting: Family Medicine

## 2018-07-30 ENCOUNTER — Ambulatory Visit (INDEPENDENT_AMBULATORY_CARE_PROVIDER_SITE_OTHER): Payer: Medicare Other | Admitting: Family Medicine

## 2018-07-30 ENCOUNTER — Telehealth: Payer: Self-pay | Admitting: Family Medicine

## 2018-07-30 VITALS — BP 118/64 | HR 57 | Temp 98.1°F | Resp 16 | Ht 70.0 in | Wt 184.6 lb

## 2018-07-30 DIAGNOSIS — R198 Other specified symptoms and signs involving the digestive system and abdomen: Secondary | ICD-10-CM

## 2018-07-30 DIAGNOSIS — R600 Localized edema: Secondary | ICD-10-CM

## 2018-07-30 DIAGNOSIS — R300 Dysuria: Secondary | ICD-10-CM | POA: Diagnosis not present

## 2018-07-30 DIAGNOSIS — N183 Chronic kidney disease, stage 3 unspecified: Secondary | ICD-10-CM

## 2018-07-30 DIAGNOSIS — I10 Essential (primary) hypertension: Secondary | ICD-10-CM

## 2018-07-30 DIAGNOSIS — R1031 Right lower quadrant pain: Secondary | ICD-10-CM

## 2018-07-30 DIAGNOSIS — R350 Frequency of micturition: Secondary | ICD-10-CM

## 2018-07-30 DIAGNOSIS — R801 Persistent proteinuria, unspecified: Secondary | ICD-10-CM | POA: Diagnosis not present

## 2018-07-30 NOTE — Patient Instructions (Signed)
CCM (Chronic Care Management) Program [facility] Care Management  [phone]

## 2018-07-30 NOTE — Telephone Encounter (Signed)
Documentation reviewed 

## 2018-07-30 NOTE — Telephone Encounter (Signed)
Dr. Ancil Boozer and I spoke with both patient and his daughter in detail in regards to their issue.  Also referred patient to our new CCM program to help with the communication between our office and the specialist so that the communication gap is closed.

## 2018-07-30 NOTE — Progress Notes (Signed)
  Chronic Care Management   CCM Consult Note  07/30/2018 Name: Jose Castro MRN: [id number] DOB: 06/16/1926  Referred by: Steele Sizer, MD Reason for referral : Follow-up  Jose Castro was referred to the CCM program today by Raelyn Ensign FNP primarily for care coordination needs.  [name], PharmD and I spoke with Jose Castro and his daughter and primary caregiver Jose Castro in person. We explained the CCM program to [name] and his daughter and addressed their questions. Ms. Eckstein would like written information or print material regarding the program before she agrees to CCM services with her father. Mr. taeshaun rames agreement.   Plan: I will send requested information to Mr. & [name] as requested and will follow up with them in the following week. I provided Mr. & [name] with my contact information.   [facility] [place] Management  [phone]

## 2018-07-30 NOTE — Telephone Encounter (Signed)
Copied from [place] [id number]. Topic: Complaint - Care >> Jul 29, 2018  2:48 PM Margot Ables wrote: Date of Incident: ongoing concerns that heightened with decline of refilling lisinopril 07/29/18 Details of complaint: Pts daughter states there have been several issues with patient care, follow up, getting in for appointments, and the administrative side of the practice in general since Dr. Rutherford Nail left the office. She states that they cannot get in for visits and they are directed to urgent care/hospital. When they are seen in the office no one knows what is wrong. [name] states no one followed up with her/the patient after having his pacemaker, urology appt, or GI appt. Pt feels that it is due to his age and decline in health recently.  How would the patient like to see it resolved? No resolution voiced but I did advise I would notify office manager and that she would likely receive a call back. I advised [name] that I could request the recorded call to be sent to the office manager due to the concerns listed and she stated that would be fine. I have asked Chauncey Mann to forward the call.  Route to Engineer, building services.   I will forward this to [place], our office manager He had a pacemaker and was seen by cardiologist since his last refill and on Dr. Etta Quill note he was on 20 mg, our prescription for 10 mg and he should not be out of medication. Pharmacy stated they were out of refills on file.  We sent him to [facility] because of very low heart rate, he has been seeing [place] for all the other problems. He had 3 visits in our office in July and 2 visits in August for acute problems.

## 2018-07-30 NOTE — Progress Notes (Signed)
Name: Jose Castro   MRN: [id number]    DOB: [birth year]   Date:07/30/2018       Progress Note  Subjective  Chief Complaint  Chief Complaint  Patient presents with  . Follow-up    HPI  Pt presents with his daughter, [name], with the following concerns:  HTN: He is seeing Dr. Clayborn Bigness for cardiac management.  We spoke with his office today and they advised that they will be managing his lisinopril Rx going forward.  I passed this information on to pt and his daughter and recommend they call the office today to request refills.  Denies lightheadedness/dizziness, or abnormal BLE edema (has some baseline in the LLE). Denies chest pain or shortness of breath.  Daughter called while in office - refill to be sent from Dr. Etta Quill office.  Recent pacemaker placed: He has follow up with Dr. Clayborn Bigness this week with an Echocardiogram scheduled.  Pacer site has no erythema or ecchymosis, no pain at the site.  Urinary Frequency and dysuria: Was seen by urology (Dr. Yves Dill) for this issue.  Problem started after having pacemaker surgery.  He was had urinary retention 07/12/2018 - had catheter placed, removed 1 week later; still having frequency and dysuria. He reports stable amount of urine output - has follow up with Dr. Yves Dill 08/03/2018.  Abdominal Pain: Planning to see Dr. Allen Norris - he was referred when he had blood in his stool (was constipated at that time).  He did see Dr. Marius Ditch and was recommended to have a colonoscopy; he has an appointment with Dr. Allen Norris on September 08, 2018 - plans to discuss colonoscopy at that time.  He denies pain today; but is having ongoing urinary symptoms as above.  Decreased Kidney Function/Proteinuria: He had proteinuria, has appointment scheduled with Dr. Candiss Norse - plans to talk to Dr. Yves Dill to determine if he still needs to go to this appointment or not. Discussed different ways kidney function could have been affected - Vancomycin, propofol use for pacer surgery, pacer  surgery, urinary retention, and recent dehydration.  Pt and daughter are comfortable with following up with urology and nephrology as planned.  Patient Active Problem List   Diagnosis Date Noted  . 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 Chronic kidney disease, stage III (moderate) ([place]) 06/15/2018  . Leg pain 05/06/2018  . Lymphedema 05/06/2018  . Coagulopathy ([place]) 07/07/2017  . Lumbar spondylosis 03/10/2017  . Elevated uric acid in blood 11/20/2016  . BPH (benign prostatic hyperplasia) 05/29/2016  . Peripheral vascular disease of lower extremity ([place]) 03/03/2016  . Gallstone 12/31/2015  . 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 Atherosclerosis of aorta ([place]) 11/22/2015  . Carotid artery narrowing 11/22/2015  . Diverticulosis of colon 11/22/2015  . Decreased creatinine clearance 11/22/2015  . Arthritis, degenerative 11/22/2015  . Lactose intolerance 11/22/2015  . Basal cell carcinoma 11/22/2015  . Essential hypertension 06/21/2015  . Hyperlipemia 06/21/2015  . Gastroesophageal reflux disease with esophagitis 06/21/2015  . Calculus of kidney 11/18/2013    Past Surgical History:  Procedure Laterality Date  . APPENDECTOMY    . KIDNEY STONE SURGERY    . KNEE SURGERY    . PACEMAKER INSERTION N/A 07/13/2018   Procedure: INSERTION PACEMAKER-DUEL CHAMBER INITIAL IMPLANT;  Surgeon: Isaias Cowman, MD;  Location: [facility] ORS;  Service: Cardiovascular;  Laterality: N/A;  . SPINE SURGERY      Family History  Problem Relation Age of Onset  . Cancer Brother  bladder cancer with mets  . Heart disease Mother   . Aortic aneurysm Mother   . Heart attack Maternal Aunt   . Heart attack Maternal Uncle     Social History   Socioeconomic History  . Marital status: Widowed    Spouse name: Not on file  . Number of children: 6  . Years of education: Not on file  . Highest education level: Some college, no degree  Occupational History    . Occupation: retired    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  Social Needs  . Financial resource strain: Not hard at all  . Food insecurity:    Worry: Never true    Inability: Never true  . Transportation needs:    Medical: No    Non-medical: No  Tobacco Use  . Smoking status: Former Smoker    Years: 16.00    Types: Cigarettes    Start date: 10/21/1944    Last attempt to quit: 10/21/1960    Years since quitting: 57.8  . Smokeless tobacco: Never Used  Substance and Sexual Activity  . Alcohol use: Yes    Alcohol/week: 3.0 standard drinks    Types: 3 Standard drinks or equivalent per week    Comment: once a week when he goes out to dinner-socially  . Drug use: No  . Sexual activity: Yes    Partners: Female  Lifestyle  . Physical activity:    Days per week: 2 days    Minutes per session: 60 min  . Stress: Not at all  Relationships  . Social connections:    Talks on phone: Three times a week    Gets together: Twice a week    Attends religious service: More than 4 times per year    Active member of club or organization: Yes    Attends meetings of clubs or organizations: More than 4 times per year    Relationship status: Widowed  . Intimate partner violence:    Fear of current or ex partner: No    Emotionally abused: No    Physically abused: No    Forced sexual activity: No  Other Topics Concern  . Not on file  Social History Narrative   Widow, has a good networks of friends and family      Current Outpatient Medications:  .  brimonidine (ALPHAGAN) 0.2 % ophthalmic solution, Place 1 drop into both eyes 2 (two) times daily. , Disp: , Rfl:  .  Cholecalciferol (VITAMIN D3) 2000 units TABS, Take 2,000 Units by mouth daily. , Disp: , Rfl:  .  CIALIS 5 MG tablet, Take 5 mg by mouth daily as needed for erectile dysfunction. , Disp: , Rfl:  .  finasteride (PROSCAR) 5 MG tablet, Take 5 mg by mouth daily. , Disp: , Rfl:  .  lisinopril (PRINIVIL,ZESTRIL) 10 MG tablet,  TAKE 1 TABLET BY MOUTH DAILY, Disp: 30 tablet, Rfl: 0 .  Omega-3 Fatty Acids (FISH OIL) 1000 MG CAPS, Take 1,000 mg by mouth daily. , Disp: , Rfl:  .  pantoprazole (PROTONIX) 40 MG tablet, Take 1 tablet (40 mg total) by mouth daily., Disp: 30 tablet, Rfl: 0 .  ranitidine (ZANTAC) 150 MG capsule, Take 150 mg by mouth 2 (two) times daily as needed for heartburn. , Disp: , Rfl:  .  tamsulosin (FLOMAX) 0.4 MG CAPS capsule, Take 1 capsule (0.4 mg total) by mouth daily after breakfast., Disp: 14 capsule, Rfl: 0  Allergies  Allergen Reactions  .  Penicillins Itching and Swelling    Has patient had a PCN reaction causing immediate rash, facial/tongue/throat swelling, SOB or lightheadedness with hypotension: No Has patient had a PCN reaction causing severe rash involving mucus membranes or skin necrosis: No Has patient had a PCN reaction that required hospitalization: No Has patient had a PCN reaction occurring within the last 10 years: No If all of the above answers are "NO", then may proceed with Cephalosporin use.     ROS Constitutional: Negative for fever or weight change.  Respiratory: Negative for cough and shortness of breath.   Cardiovascular: Negative for chest pain or palpitations.  Gastrointestinal: Negative for abdominal pain, no bowel changes.  Musculoskeletal: Negative for gait problem or joint swelling.  Skin: Negative for rash.  Neurological: Negative for dizziness or headache.  No other specific complaints in a complete review of systems (except as listed in HPI above).  Objective  Vitals:   07/30/18 1009  BP: 118/64  Pulse: (!) 57  Resp: 16  Temp: 98.1 F (36.7 C)  TempSrc: Oral  SpO2: 96%  Weight: 184 lb 9.6 oz (83.7 kg)  Height: 5\' 10"  (1.778 m)   Body mass index is 26.49 kg/m.  HR slightly low today, has pacer in place.  Physical Exam Constitutional: Patient appears well-developed and well-nourished. No distress.  HENT: Head: Normocephalic and  atraumatic. Eyes: Conjunctivae and EOM are normal. No scleral icterus.  Neck: Normal range of motion. Neck supple. No JVD present. No thyromegaly present.  Cardiovascular: Normal rate, regular rhythm and normal heart sounds.  No murmur heard. No RLE edema; +Trace LLE edema Pulmonary/Chest: Effort normal and breath sounds normal. No respiratory distress. Musculoskeletal: Normal range of motion, no joint effusions. No gross deformities Neurological: he is alert and oriented to person, place, and time. No cranial nerve deficit. Coordination, balance, strength, speech and gait are normal.  Skin: Skin is warm and dry. No rash noted. No erythema. LEFT chest has surgical scar with steri-strips in place overlying pacer - non-tender, no erythema, no exudate, edges are well-approximated. Psychiatric: Patient has a normal mood and affect. behavior is normal. Judgment and thought content normal.  No results found for this or any previous visit (from the past 72 hour(s)).  PHQ2/9: Depression screen [facility] 2/9 07/30/2018 06/21/2018 06/15/2018 09/09/2017 07/07/2017  Decreased Interest 0 0 0 0 0  Down, Depressed, Hopeless 0 0 0 0 0  PHQ - 2 Score 0 0 0 0 0  Altered sleeping 0 - - - -  Tired, decreased energy 0 - - - -  Change in appetite 0 - - - -  Feeling bad or failure about yourself  0 - - - -  Trouble concentrating 0 - - - -  Moving slowly or fidgety/restless 0 - - - -  Suicidal thoughts 0 - - - -  Difficult doing work/chores Not difficult at all - - - -   Fall Risk: Fall Risk  07/30/2018 06/29/2018 06/21/2018 06/15/2018 03/01/2018  Falls in the past year? No No No No No  Number falls in past yr: - - - - -  Injury with Fall? - - - - -  Follow up - - - - -   Assessment & Plan  1. Essential hypertension - Stable, continue current regimen. - Ambulatory referral to [facility]  2. Dysuria - Keep follow up with urology - Ambulatory referral to [facility]  3.  Persistent proteinuria - Keep follow up with urology; strongly  recommend he attend nephrology appointment - Ambulatory referral to [facility]  4. Chronic kidney disease, stage III (moderate) (HCC) - Keep follow up with urology; strongly recommend he attend nephrology appointment - Ambulatory referral to [facility]  5. Change in bowel movement - Keep follow up with Dr. Allen Norris - Ambulatory referral to [place]  6. Right lower quadrant abdominal pain - Keep follow up with Dr. Allen Norris - Ambulatory referral to [place]  7. Urinary frequency Keep urology follow up  8. Leg edema, left - Continue with compression stockings and vascular follow up.  Face-to-face time with patient was more than 25 minutes, >50% time spent counseling and coordination of care  --------------- At the end of the visit, Miel Mock, Environmental education officer, and Dr. Ancil Boozer, PCP spoke with patient and his daughter, [name] regarding their care concerns.  Chronic Care management also came to speak with the patient and his daughter.

## 2018-07-30 NOTE — Telephone Encounter (Signed)
Per Raquel Sarna NP. Office called [facility] and spoke to nurse [facility] regarding patients Lisinopril. Heather the MA for Dr. Clayborn Bigness stated that they are managing his Lisinopril medication

## 2018-08-01 HISTORY — PX: EYE SURGERY: SHX253

## 2018-08-06 ENCOUNTER — Ambulatory Visit: Payer: Medicare Other | Admitting: Gastroenterology

## 2018-08-25 ENCOUNTER — Other Ambulatory Visit: Payer: Self-pay | Admitting: Family Medicine

## 2018-08-25 DIAGNOSIS — I10 Essential (primary) hypertension: Secondary | ICD-10-CM

## 2018-09-08 ENCOUNTER — Encounter: Payer: Self-pay | Admitting: Gastroenterology

## 2018-09-08 ENCOUNTER — Ambulatory Visit (INDEPENDENT_AMBULATORY_CARE_PROVIDER_SITE_OTHER): Payer: Medicare Other | Admitting: Gastroenterology

## 2018-09-08 VITALS — BP 170/90 | HR 66 | Ht 70.0 in | Wt 188.0 lb

## 2018-09-08 DIAGNOSIS — K5900 Constipation, unspecified: Secondary | ICD-10-CM

## 2018-09-08 NOTE — Progress Notes (Signed)
Gastroenterology Consultation  Referring Provider:     Steele Sizer, MD Primary Care Physician:  Steele Sizer, MD Primary Gastroenterologist:  Dr. Allen Norris     Reason for Consultation:     Constipation        HPI:   Jose Castro is a 83 y.o. y/o male referred for consultation & management of constipation by Dr. Ancil Boozer, Drue Stager, MD.  Patient comes today after being seen by my partner for constipation.  The patient has a history of constipation and was seen by his cardiologist and question whether he had had a recent colonoscopy.  The patient was also evaluated by Dr. Marius Ditch who was concerned about his constipation and his last colonoscopy being around 20 years ago.  The patient has no other symptoms such as weight loss fevers chills black stools or bloody stools.  The patient also does not have any signs of anemia.  The patient comes with his daughter who states that she is concerned that something may be going on since the patient is doing well and self-sufficient and she is concerned about missing some sort of preventable disease such as polyps or possible early cancer.  The patient is very active and driving himself around and takes care of himself.   Past Medical History:  Diagnosis Date  . Arthritis   . BPH (benign prostatic hyperplasia)   . Chronic kidney disease    had a kidney stone which per family was a cyst that was removed  . Dysrhythmia   . GERD (gastroesophageal reflux disease)   . History of kidney stones   . Hyperlipidemia   . Hypertension   . Splenic vein thrombosis   . Thrombosis 12/2015   mural  area and no notation of heart attack    Past Surgical History:  Procedure Laterality Date  . APPENDECTOMY    . KIDNEY STONE SURGERY    . KNEE SURGERY    . PACEMAKER INSERTION N/A 07/13/2018   Procedure: INSERTION PACEMAKER-DUEL CHAMBER INITIAL IMPLANT;  Surgeon: Isaias Cowman, MD;  Location: [facility] ORS;  Service: Cardiovascular;  Laterality: N/A;  . SPINE SURGERY       Prior to Admission medications   Medication Sig Start Date End Date Taking? Authorizing Provider  brimonidine (ALPHAGAN) 0.2 % ophthalmic solution Place 1 drop into both eyes 2 (two) times daily.  06/11/15  Yes [provider]  Cholecalciferol (VITAMIN D3) 2000 units TABS Take 2,000 Units by mouth daily.    Yes [provider]  CIALIS 5 MG tablet Take 5 mg by mouth daily as needed for erectile dysfunction.  06/11/15  Yes [provider]  finasteride (PROSCAR) 5 MG tablet Take 5 mg by mouth daily.  06/11/15  Yes [provider]  lisinopril (PRINIVIL,ZESTRIL) 10 MG tablet TAKE ONE TABLET BY MOUTH EVERY DAY 08/25/18  Yes Sowles, Drue Stager, MD  Omega-3 Fatty Acids (FISH OIL) 1000 MG CAPS Take 1,000 mg by mouth daily.    Yes [provider]  pantoprazole (PROTONIX) 40 MG tablet Take 1 tablet (40 mg total) by mouth daily. 07/05/18 07/05/19 Yes Darel Hong, MD  ranitidine (ZANTAC) 150 MG capsule Take 150 mg by mouth 2 (two) times daily as needed for heartburn.    Yes [provider]  tamsulosin (FLOMAX) 0.4 MG CAPS capsule Take 1 capsule (0.4 mg total) by mouth daily after breakfast. 07/16/18  Yes Poulose, Bethel Born, NP    Family History  Problem Relation Age of Onset  . Cancer Brother  bladder cancer with mets  . Heart disease Mother   . Aortic aneurysm Mother   . Heart attack Maternal Aunt   . Heart attack Maternal Uncle      Social History   Tobacco Use  . Smoking status: Former Smoker    Years: 16.00    Types: Cigarettes    Start date: 10/21/1944    Last attempt to quit: 10/21/1960    Years since quitting: 57.9  . Smokeless tobacco: Never Used  Substance Use Topics  . Alcohol use: Yes    Alcohol/week: 3.0 standard drinks    Types: 3 Standard drinks or equivalent per week    Comment: once a week when he goes out to dinner-socially  . Drug use: No    Allergies as of 09/08/2018 - Review Complete 09/08/2018  Allergen  Reaction Noted  . Penicillins Itching and Swelling 10/22/2015    Review of Systems:    All systems reviewed and negative except where noted in HPI.   Physical Exam:  BP (!) 170/90   Pulse 66   Ht 5\' 10"  (1.778 m)   Wt 188 lb (85.3 kg)   BMI 26.98 kg/m  No LMP for male patient. General:   Alert,  Well-developed, well-nourished, pleasant and cooperative in NAD Head:  Normocephalic and atraumatic. Eyes:  Sclera clear, no icterus.   Conjunctiva pink. Rectal:  Deferred.  Msk:  Symmetrical without gross deformities.  Good, equal movement & strength bilaterally. Pulses:  Normal pulses noted. Extremities:  No clubbing or edema.  No cyanosis. Neurologic:  Alert and oriented x3;  grossly normal neurologically. Skin:  Intact without significant lesions or rashes.  No jaundice. Psych:  Alert and cooperative. Normal mood and affect.  Imaging Studies: No results found.  Assessment and Plan:   Jose Castro is a 84 y.o. y/o male who comes in with a change in bowel habits and constipation.  The patient has not had any worry symptoms such as weight loss black stools or bloody stools he also does not have any sign of anemia.  The patient's daughter states that she feels that the patient is in such good health that he may survive the next 10 years and is contemplating whether he should undergo a colonoscopy.  The patient has been given all the positives and negatives of doing a colonoscopy and the risks associated with it.  He will consider it with his family at home and then contact me if he decides to undergo the colonoscopy or not.  [name], MD. Marval Regal    Note: This dictation was prepared with Dragon dictation along with smaller phrase technology. Any transcriptional errors that result from this process are unintentional.

## 2018-10-22 ENCOUNTER — Other Ambulatory Visit: Payer: Self-pay | Admitting: Family Medicine

## 2018-10-22 DIAGNOSIS — K2100 Gastro-esophageal reflux disease with esophagitis, without bleeding: Secondary | ICD-10-CM

## 2018-11-20 ENCOUNTER — Other Ambulatory Visit: Payer: Self-pay | Admitting: Family Medicine

## 2018-12-02 ENCOUNTER — Other Ambulatory Visit: Payer: Self-pay

## 2018-12-02 ENCOUNTER — Ambulatory Visit (INDEPENDENT_AMBULATORY_CARE_PROVIDER_SITE_OTHER): Payer: Medicare Other | Admitting: Vascular Surgery

## 2018-12-02 ENCOUNTER — Ambulatory Visit (INDEPENDENT_AMBULATORY_CARE_PROVIDER_SITE_OTHER): Payer: Medicare Other

## 2018-12-02 ENCOUNTER — Encounter (INDEPENDENT_AMBULATORY_CARE_PROVIDER_SITE_OTHER): Payer: Self-pay | Admitting: Vascular Surgery

## 2018-12-02 VITALS — BP 130/76 | HR 75 | Resp 17 | Ht 70.0 in | Wt 184.0 lb

## 2018-12-02 DIAGNOSIS — E782 Mixed hyperlipidemia: Secondary | ICD-10-CM

## 2018-12-02 DIAGNOSIS — K219 Gastro-esophageal reflux disease without esophagitis: Secondary | ICD-10-CM | POA: Diagnosis not present

## 2018-12-02 DIAGNOSIS — I739 Peripheral vascular disease, unspecified: Secondary | ICD-10-CM | POA: Diagnosis not present

## 2018-12-02 DIAGNOSIS — Z87891 Personal history of nicotine dependence: Secondary | ICD-10-CM

## 2018-12-02 DIAGNOSIS — I10 Essential (primary) hypertension: Secondary | ICD-10-CM | POA: Diagnosis not present

## 2018-12-02 DIAGNOSIS — I6523 Occlusion and stenosis of bilateral carotid arteries: Secondary | ICD-10-CM

## 2018-12-02 NOTE — Progress Notes (Signed)
MRN : [id number]  Jose Castro is a 85 y.o. ([birth year]) male who presents with chief complaint of  Chief Complaint  Patient presents with  . Follow-up    ultrasound  .  History of Present Illness:   The patient returns to the office for followup and review of the noninvasive studies. There have been no interval changes in lower extremity symptoms. No interval shortening of the patient's claudication distance or development of rest pain symptoms. No new ulcers or wounds have occurred since the last visit.  There have been no significant changes to the patient's overall health care.  The patient denies amaurosis fugax or recent TIA symptoms. There are no recent neurological changes noted. The patient denies history of DVT, PE or superficial thrombophlebitis. The patient denies recent episodes of angina or shortness of breath.   ABI Rt=1.03 and Lt=1.00    Current Meds  Medication Sig  . Cholecalciferol (VITAMIN D3) 2000 units TABS Take 2,000 Units by mouth daily.   Marland Kitchen CIALIS 5 MG tablet Take 5 mg by mouth daily as needed for erectile dysfunction.   Marland Kitchen lisinopril (PRINIVIL,ZESTRIL) 10 MG tablet TAKE ONE TABLET BY MOUTH EVERY DAY  . Omega-3 Fatty Acids (FISH OIL) 1000 MG CAPS Take 1,000 mg by mouth daily.   . pantoprazole (PROTONIX) 40 MG tablet TAKE ONE TABLET BY MOUTH EVERY DAY    Past Medical History:  Diagnosis Date  . Arthritis   . BPH (benign prostatic hyperplasia)   . Chronic kidney disease    had a kidney stone which per family was a cyst that was removed  . Dysrhythmia   . GERD (gastroesophageal reflux disease)   . History of kidney stones   . Hyperlipidemia   . Hypertension   . Splenic vein thrombosis   . Thrombosis 12/2015   mural  area and no notation of heart attack    Past Surgical History:  Procedure Laterality Date  . APPENDECTOMY    . KIDNEY STONE SURGERY    . KNEE SURGERY    . PACEMAKER INSERTION N/A 07/13/2018   Procedure: INSERTION PACEMAKER-DUEL  CHAMBER INITIAL IMPLANT;  Surgeon: Isaias Cowman, MD;  Location: [facility] ORS;  Service: Cardiovascular;  Laterality: N/A;  . SPINE SURGERY      Social History Social History   Tobacco Use  . Smoking status: Former Smoker    Years: 16.00    Types: Cigarettes    Start date: 10/21/1944    Last attempt to quit: 10/21/1960    Years since quitting: 58.1  . Smokeless tobacco: Never Used  Substance Use Topics  . Alcohol use: Yes    Alcohol/week: 3.0 standard drinks    Types: 3 Standard drinks or equivalent per week    Comment: once a week when he goes out to dinner-socially  . Drug use: No    Family History Family History  Problem Relation Age of Onset  . Cancer Brother        bladder cancer with mets  . Heart disease Mother   . Aortic aneurysm Mother   . Heart attack Maternal Aunt   . Heart attack Maternal Uncle     Allergies  Allergen Reactions  . Penicillins Itching and Swelling    Has patient had a PCN reaction causing immediate rash, facial/tongue/throat swelling, SOB or lightheadedness with hypotension: No Has patient had a PCN reaction causing severe rash involving mucus membranes or skin necrosis: No Has patient had a PCN reaction that required hospitalization: No  Has patient had a PCN reaction occurring within the last 10 years: No If all of the above answers are "NO", then may proceed with Cephalosporin use.      REVIEW OF SYSTEMS (Negative unless checked)  Constitutional: [] Weight loss  [] Fever  [] Chills Cardiac: [] Chest pain   [] Chest pressure   [] Palpitations   [] Shortness of breath when laying flat   [] Shortness of breath with exertion. Vascular:  [] Pain in legs with walking   [] Pain in legs at rest  [] History of DVT   [] Phlebitis   [x] Swelling in legs   [] Varicose veins   [] Non-healing ulcers Pulmonary:   [] Uses home oxygen   [] Productive cough   [] Hemoptysis   [] Wheeze  [] COPD   [] Asthma Neurologic:  [] Dizziness   [] Seizures   [] History of stroke    [] History of TIA  [] Aphasia   [] Vissual changes   [] Weakness or numbness in arm   [] Weakness or numbness in leg Musculoskeletal:   [] Joint swelling   [x] Joint pain   [] Low back pain Hematologic:  [] Easy bruising  [] Easy bleeding   [] Hypercoagulable state   [] Anemic Gastrointestinal:  [] Diarrhea   [] Vomiting  [] Gastroesophageal reflux/heartburn   [] Difficulty swallowing. Genitourinary:  [x] Chronic kidney disease   [] Difficult urination  [] Frequent urination   [] Blood in urine Skin:  [] Rashes   [] Ulcers  Psychological:  [] History of anxiety   []  History of major depression.  Physical Examination  Vitals:   12/02/18 1355  BP: 130/76  Pulse: 75  Resp: 17  Weight: 184 lb (83.5 kg)  Height: 5\' 10"  (1.778 m)   Body mass index is 26.4 kg/m. Gen: WD/WN, NAD Head: [place]/AT, No temporalis wasting.  Ear/Nose/Throat: Hearing grossly intact, nares w/o erythema or drainage Eyes: PER, EOMI, sclera nonicteric.  Neck: Supple, no large masses.   Pulmonary:  Good air movement, no audible wheezing bilaterally, no use of accessory muscles.  Cardiac: RRR, no JVD Vascular:  scattered varicosities present bilaterally.  Mild venous stasis changes to the legs bilaterally.  2+ soft pitting edema Vessel Right Left  Radial Palpable Palpable  PT Palpable Trace Palpable  DP Palpable Trace Palpable  Gastrointestinal: Non-distended. No guarding/no peritoneal signs.  Musculoskeletal: M/S 5/5 throughout.  No deformity or atrophy.  Neurologic: CN 2-12 intact. Symmetrical.  Speech is fluent. Motor exam as listed above. Psychiatric: Judgment intact, Mood & affect appropriate for pt's clinical situation. Dermatologic: No rashes or ulcers noted.  No changes consistent with cellulitis. Lymph : No lichenification or skin changes of chronic lymphedema.  CBC Lab Results  Component Value Date   WBC 9.5 07/17/2018   HGB 13.5 07/17/2018   HCT 39.5 (L) 07/17/2018   MCV 91.6 07/17/2018   PLT 317 07/17/2018    BMET      Component Value Date/Time   NA 140 07/17/2018 0444   NA 144 11/19/2016 0955   NA 138 11/27/2013 0819   K 4.1 07/17/2018 0444   K 4.1 11/27/2013 0819   CL 107 07/17/2018 0444   CL 107 11/27/2013 0819   CO2 24 07/17/2018 0444   CO2 23 11/27/2013 0819   GLUCOSE 96 07/17/2018 0444   GLUCOSE 101 (H) 11/27/2013 0819   BUN 36 (H) 07/17/2018 0444   BUN 25 11/19/2016 0955   BUN 22 (H) 11/27/2013 0819   CREATININE 1.78 (H) 07/17/2018 0444   CREATININE 1.30 (H) 07/16/2018 1551   CALCIUM 8.9 07/17/2018 0444   CALCIUM 8.2 (L) 11/27/2013 0819   GFRNONAA 32 (L) 07/17/2018 0444   GFRNONAA  48 (L) 07/16/2018 1551   GFRAA 37 (L) 07/17/2018 0444   GFRAA 56 (L) 07/16/2018 1551   CrCl cannot be calculated (Patient's most recent lab result is older than the maximum 21 days allowed.).  COAG Lab Results  Component Value Date   INR 1.04 07/06/2018   INR 1.1 12/17/2015   INR 1.1 12/17/2015    Radiology No results found.   Assessment/Plan 1. Peripheral vascular disease of lower extremity (HCC)  Recommend:  The patient has evidence of atherosclerosis of the lower extremities with claudication.  The patient does not voice lifestyle limiting changes at this point in time.  Noninvasive studies do not suggest clinically significant change.  No invasive studies, angiography or surgery at this time The patient should continue walking and begin a more formal exercise program.  The patient should continue antiplatelet therapy and aggressive treatment of the lipid abnormalities  No changes in the patient's medications at this time  The patient should continue wearing graduated compression socks 10-15 mmHg strength to control the mild edema.   - VAS [place] ABI WITH/WO TBI; Future  2. Bilateral carotid artery stenosis Recommend:  Given the patient's asymptomatic subcritical stenosis no further invasive testing or surgery at this time.  Duplex ultrasound shows <70% stenosis bilaterally.  Continue  antiplatelet therapy as prescribed Continue management of CAD, HTN and Hyperlipidemia Healthy heart diet,  encouraged exercise at least 4 times per week   3. Essential hypertension Continue antihypertensive medications as already ordered, these medications have been reviewed and there are no changes at this time.   4. Gastroesophageal reflux disease without esophagitis Continue PPI as already ordered, this medication has been reviewed and there are no changes at this time.  Avoidence of caffeine and alcohol  Moderate elevation of the head of the bed   5. Mixed hyperlipidemia Continue statin as ordered and reviewed, no changes at this time    Hortencia Pilar, MD  12/02/2018 5:12 PM

## 2018-12-08 ENCOUNTER — Other Ambulatory Visit: Payer: Self-pay | Admitting: Rheumatology

## 2018-12-30 ENCOUNTER — Other Ambulatory Visit: Payer: Self-pay | Admitting: Family Medicine

## 2019-02-01 ENCOUNTER — Ambulatory Visit (INDEPENDENT_AMBULATORY_CARE_PROVIDER_SITE_OTHER): Payer: Medicare Other

## 2019-02-01 VITALS — BP 122/82 | HR 85 | Temp 97.6°F | Resp 16 | Ht 70.0 in | Wt 186.8 lb

## 2019-02-01 DIAGNOSIS — Z0000 Encounter for general adult medical examination without abnormal findings: Secondary | ICD-10-CM

## 2019-02-01 NOTE — Patient Instructions (Signed)
Jose Castro , Thank you for taking time to come for your Medicare Wellness Visit. I appreciate your ongoing commitment to your health goals. Please review the following plan we discussed and let me know if I can assist you in the future.   Screening recommendations/referrals: Colonoscopy: no longer required Recommended yearly ophthalmology/optometry visit for glaucoma screening and checkup Recommended yearly dental visit for hygiene and checkup  Vaccinations: Influenza vaccine: postponed Pneumococcal vaccine: done 02/06/14 Tdap vaccine: done 06/08/12 Shingles vaccine: Shingrix discussed. Please contact your pharmacy for coverage information.   Advanced directives: Advance directive discussed with you today. I have provided a copy for you to complete at home and have notarized. Once this is complete please bring a copy in to our office so we can scan it into your chart.  Conditions/risks identified: Recommend increasing water intake to 6-8 glasses of water per day.   Next appointment: Please follow up in one year for your Medicare Annual Wellness visit.    Preventive Care 17 Years and Older, Male Preventive care refers to lifestyle choices and visits with your health care provider that can promote health and wellness. What does preventive care include?  A yearly physical exam. This is also called an annual well check.  Dental exams once or twice a year.  Routine eye exams. Ask your health care provider how often you should have your eyes checked.  Personal lifestyle choices, including:  Daily care of your teeth and gums.  Regular physical activity.  Eating a healthy diet.  Avoiding tobacco and drug use.  Limiting alcohol use.  Practicing safe sex.  Taking low doses of aspirin every day.  Taking vitamin and mineral supplements as recommended by your health care provider. What happens during an annual well check? The services and screenings done by your health care provider  during your annual well check will depend on your age, overall health, lifestyle risk factors, and family history of disease. Counseling  Your health care provider may ask you questions about your:  Alcohol use.  Tobacco use.  Drug use.  Emotional well-being.  Home and relationship well-being.  Sexual activity.  Eating habits.  History of falls.  Memory and ability to understand (cognition).  Work and work Statistician. Screening  You may have the following tests or measurements:  Height, weight, and BMI.  Blood pressure.  Lipid and cholesterol levels. These may be checked every 5 years, or more frequently if you are over 61 years old.  Skin check.  Lung cancer screening. You may have this screening every year starting at age 57 if you have a 30-pack-year history of smoking and currently smoke or have quit within the past 15 years.  Fecal occult blood test (FOBT) of the stool. You may have this test every year starting at age 65.  Flexible sigmoidoscopy or colonoscopy. You may have a sigmoidoscopy every 5 years or a colonoscopy every 10 years starting at age 81.  Prostate cancer screening. Recommendations will vary depending on your family history and other risks.  Hepatitis C blood test.  Hepatitis B blood test.  Sexually transmitted disease (STD) testing.  Diabetes screening. This is done by checking your blood sugar (glucose) after you have not eaten for a while (fasting). You may have this done every 1-3 years.  Abdominal aortic aneurysm (AAA) screening. You may need this if you are a current or former smoker.  Osteoporosis. You may be screened starting at age 72 if you are at high risk. Talk with  your health care provider about your test results, treatment options, and if necessary, the need for more tests. Vaccines  Your health care provider may recommend certain vaccines, such as:  Influenza vaccine. This is recommended every year.  Tetanus,  diphtheria, and acellular pertussis (Tdap, Td) vaccine. You may need a Td booster every 10 years.  Zoster vaccine. You may need this after age 53.  Pneumococcal 13-valent conjugate (PCV13) vaccine. One dose is recommended after age 67.  Pneumococcal polysaccharide (PPSV23) vaccine. One dose is recommended after age 32. Talk to your health care provider about which screenings and vaccines you need and how often you need them. This information is not intended to replace advice given to you by your health care provider. Make sure you discuss any questions you have with your health care provider. Document Released: 12/14/2015 Document Revised: 08/06/2016 Document Reviewed: 09/18/2015 Elsevier Interactive Patient Education  2017 [place] Prevention in the Home Falls can cause injuries. They can happen to people of all ages. There are many things you can do to make your home safe and to help prevent falls. What can I do on the outside of my home?  Regularly fix the edges of walkways and driveways and fix any cracks.  Remove anything that might make you trip as you walk through a door, such as a raised step or threshold.  Trim any bushes or trees on the path to your home.  Use bright outdoor lighting.  Clear any walking paths of anything that might make someone trip, such as rocks or tools.  Regularly check to see if handrails are loose or broken. Make sure that both sides of any steps have handrails.  Any raised decks and porches should have guardrails on the edges.  Have any leaves, snow, or ice cleared regularly.  Use sand or salt on walking paths during winter.  Clean up any spills in your garage right away. This includes oil or grease spills. What can I do in the bathroom?  Use night lights.  Install grab bars by the toilet and in the tub and shower. Do not use towel bars as grab bars.  Use non-skid mats or decals in the tub or shower.  If you need to sit down in  the shower, use a plastic, non-slip stool.  Keep the floor dry. Clean up any water that spills on the floor as soon as it happens.  Remove soap buildup in the tub or shower regularly.  Attach bath mats securely with double-sided non-slip rug tape.  Do not have throw rugs and other things on the floor that can make you trip. What can I do in the bedroom?  Use night lights.  Make sure that you have a light by your bed that is easy to reach.  Do not use any sheets or blankets that are too big for your bed. They should not hang down onto the floor.  Have a firm chair that has side arms. You can use this for support while you get dressed.  Do not have throw rugs and other things on the floor that can make you trip. What can I do in the kitchen?  Clean up any spills right away.  Avoid walking on wet floors.  Keep items that you use a lot in easy-to-reach places.  If you need to reach something above you, use a strong step stool that has a grab bar.  Keep electrical cords out of the way.  Do not  use floor polish or wax that makes floors slippery. If you must use wax, use non-skid floor wax.  Do not have throw rugs and other things on the floor that can make you trip. What can I do with my stairs?  Do not leave any items on the stairs.  Make sure that there are handrails on both sides of the stairs and use them. Fix handrails that are broken or loose. Make sure that handrails are as long as the stairways.  Check any carpeting to make sure that it is firmly attached to the stairs. Fix any carpet that is loose or worn.  Avoid having throw rugs at the top or bottom of the stairs. If you do have throw rugs, attach them to the floor with carpet tape.  Make sure that you have a light switch at the top of the stairs and the bottom of the stairs. If you do not have them, ask someone to add them for you. What else can I do to help prevent falls?  Wear shoes that:  Do not have high  heels.  Have rubber bottoms.  Are comfortable and fit you well.  Are closed at the toe. Do not wear sandals.  If you use a stepladder:  Make sure that it is fully opened. Do not climb a closed stepladder.  Make sure that both sides of the stepladder are locked into place.  Ask someone to hold it for you, if possible.  Clearly mark and make sure that you can see:  Any grab bars or handrails.  First and last steps.  Where the edge of each step is.  Use tools that help you move around (mobility aids) if they are needed. These include:  Canes.  Walkers.  Scooters.  Crutches.  Turn on the lights when you go into a dark area. Replace any light bulbs as soon as they burn out.  Set up your furniture so you have a clear path. Avoid moving your furniture around.  If any of your floors are uneven, fix them.  If there are any pets around you, be aware of where they are.  Review your medicines with your doctor. Some medicines can make you feel dizzy. This can increase your chance of falling. Ask your doctor what other things that you can do to help prevent falls. This information is not intended to replace advice given to you by your health care provider. Make sure you discuss any questions you have with your health care provider. Document Released: 09/13/2009 Document Revised: 04/24/2016 Document Reviewed: 12/22/2014 Elsevier Interactive Patient Education  2017 [employer].

## 2019-02-01 NOTE — Progress Notes (Signed)
Subjective:   Jose Castro is a 85 y.o. male who presents for Medicare Annual/Subsequent preventive examination.  Review of Systems:   Cardiac Risk Factors include: advanced age (>87men, >58 women);male gender;hypertension     Objective:    Vitals: BP 122/82 (BP Location: Left Arm, Patient Position: Sitting, Cuff Size: Normal)   Pulse 85   Temp 97.6 F (36.4 C) (Oral)   Resp 16   Ht 5\' 10"  (1.778 m)   Wt 186 lb 12.8 oz (84.7 kg)   SpO2 98%   BMI 26.80 kg/m   Body mass index is 26.8 kg/m.  Advanced Directives 02/01/2019 07/17/2018 07/13/2018 07/05/2018 06/25/2018 09/09/2017 07/07/2017  Does Patient Have a Medical Advance Directive? No No No No No Yes Yes  Type of Advance Directive - - - - - Press photographer;Living will [place]  Does patient want to make changes to medical advance directive? - No - Patient declined No - Patient declined No - Patient declined Yes (ED - Information included in AVS) - -  Copy of [place] in Chart? - - - - - No - copy requested No - copy requested  Would patient like information on creating a medical advance directive? Yes (MAU/Ambulatory/Procedural Areas - Information given) - No - Patient declined No - Patient declined Yes (ED - Information included in AVS) - -    Tobacco Social History   Tobacco Use  Smoking Status Former Smoker  . Years: 16.00  . Types: Cigarettes  . Start date: 10/21/1944  . Last attempt to quit: 10/21/1960  . Years since quitting: 58.3  Smokeless Tobacco Never Used     Counseling given: Not Answered   Clinical Intake:  Pre-visit preparation completed: Yes  Pain : 0-10 Pain Score: 5  Pain Type: Chronic pain Pain Location: Leg Pain Orientation: Right Pain Descriptors / Indicators: (sciatica) Pain Onset: More than a month ago Pain Frequency: Intermittent     BMI - recorded: 26.8 Nutritional Status: BMI 25 -29 Overweight Nutritional Risks: None Diabetes:  No  How often do you need to have someone help you when you read instructions, pamphlets, or other written materials from your doctor or pharmacy?: 1 - Never What is the last grade level you completed in school?: some college  Interpreter Needed?: No  Information entered by :: Clemetine Marker LPN  Past Medical History:  Diagnosis Date  . Arthritis   . BPH (benign prostatic hyperplasia)   . Chronic kidney disease    had a kidney stone which per family was a cyst that was removed  . Dysrhythmia   . GERD (gastroesophageal reflux disease)   . History of kidney stones   . Hyperlipidemia   . Hypertension   . Splenic vein thrombosis   . Thrombosis 12/2015   mural  area and no notation of heart attack   Past Surgical History:  Procedure Laterality Date  . APPENDECTOMY    . CATARACT EXTRACTION, BILATERAL    . CYSTOSCOPY WITH INSERTION OF UROLIFT    . KIDNEY STONE SURGERY    . KNEE SURGERY    . PACEMAKER INSERTION N/A 07/13/2018   Procedure: INSERTION PACEMAKER-DUEL CHAMBER INITIAL IMPLANT;  Surgeon: Isaias Cowman, MD;  Location: [facility] ORS;  Service: Cardiovascular;  Laterality: N/A;  . SPINE SURGERY     Family History  Problem Relation Age of Onset  . Cancer Brother        bladder cancer with mets  . Heart disease  Mother   . Aortic aneurysm Mother   . Heart attack Maternal Aunt   . Heart attack Maternal Uncle    Social History   Socioeconomic History  . Marital status: Widowed    Spouse name: Not on file  . Number of children: 6  . Years of education: Not on file  . Highest education level: Some college, no degree  Occupational History  . Occupation: retired    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  Social Needs  . Financial resource strain: Not hard at all  . Food insecurity:    Worry: Never true    Inability: Never true  . Transportation needs:    Medical: No    Non-medical: No  Tobacco Use  . Smoking status: Former Smoker    Years: 16.00    Types:  Cigarettes    Start date: 10/21/1944    Last attempt to quit: 10/21/1960    Years since quitting: 58.3  . Smokeless tobacco: Never Used  Substance and Sexual Activity  . Alcohol use: Yes    Alcohol/week: 3.0 standard drinks    Types: 3 Standard drinks or equivalent per week    Comment: once a week when he goes out to dinner-socially  . Drug use: No  . Sexual activity: Yes    Partners: Female  Lifestyle  . Physical activity:    Days per week: 0 days    Minutes per session: 0 min  . Stress: Not at all  Relationships  . Social connections:    Talks on phone: More than three times a week    Gets together: Twice a week    Attends religious service: More than 4 times per year    Active member of club or organization: Yes    Attends meetings of clubs or organizations: More than 4 times per year    Relationship status: Widowed  Other Topics Concern  . Not on file  Social History Narrative   Widow, has a good networks of friends and family     Outpatient Encounter Medications as of 02/01/2019  Medication Sig  . Cholecalciferol (VITAMIN D3) 2000 units TABS Take 2,000 Units by mouth daily.   Marland Kitchen CIALIS 5 MG tablet Take 5 mg by mouth daily as needed for erectile dysfunction.   . finasteride (PROSCAR) 5 MG tablet Take 5 mg by mouth daily.   Marland Kitchen lisinopril (PRINIVIL,ZESTRIL) 20 MG tablet Take 1 tablet by mouth daily.  . Omega-3 Fatty Acids (FISH OIL) 1000 MG CAPS Take 1,000 mg by mouth daily.   . pantoprazole (PROTONIX) 40 MG tablet TAKE ONE TABLET EVERY DAY  . [DISCONTINUED] ranitidine (ZANTAC) 150 MG capsule Take 150 mg by mouth 2 (two) times daily as needed for heartburn.   . [DISCONTINUED] brimonidine (ALPHAGAN) 0.2 % ophthalmic solution Place 1 drop into both eyes 2 (two) times daily.   . [DISCONTINUED] lisinopril (PRINIVIL,ZESTRIL) 10 MG tablet TAKE ONE TABLET BY MOUTH EVERY DAY  . [DISCONTINUED] tamsulosin (FLOMAX) 0.4 MG CAPS capsule Take 1 capsule (0.4 mg total) by mouth daily after  breakfast. (Patient not taking: Reported on 12/02/2018)   No facility-administered encounter medications on file as of 02/01/2019.     Activities of Daily Living In your present state of health, do you have any difficulty performing the following activities: 02/01/2019 07/13/2018  Hearing? Y -  Comment declines hearing aids -  Vision? N -  Difficulty concentrating or making decisions? N -  Walking or climbing  stairs? N -  Dressing or bathing? N -  Doing errands, shopping? N N  Preparing Food and eating ? N -  Using the Toilet? N -  In the past six months, have you accidently leaked urine? N -  Do you have problems with loss of bowel control? N -  Managing your Medications? N -  Managing your Finances? N -  Housekeeping or managing your Housekeeping? N -  Some recent data might be hidden    Patient Care Team: Steele Sizer, MD as PCP - General (Family Medicine) Royston Cowper, MD as Consulting Physician (Urology) Yolonda Kida, MD as Consulting Physician (Cardiology) Cammie Sickle, MD as Consulting Physician (Internal Medicine) Delana Meyer, Dolores Lory, MD as Consulting Physician (Vascular Surgery) Ralene Bathe, MD as Consulting Physician (Dermatology)   Assessment:   This is a routine wellness examination for Nhia.  Exercise Activities and Dietary recommendations Current Exercise Habits: The patient does not participate in regular exercise at present, Exercise limited by: orthopedic condition(s)  Goals    . DIET - INCREASE WATER INTAKE     Recommend drinking 6-8 glasses of water per day.        Fall Risk Fall Risk  02/01/2019 07/30/2018 06/29/2018 06/21/2018 06/15/2018  Falls in the past year? 0 No No No No  Number falls in past yr: 0 - - - -  Injury with Fall? 0 - - - -  Follow up Falls prevention discussed - - - -   FALL RISK PREVENTION PERTAINING TO THE HOME:  Any stairs in or around the home? Yes  If so, do they handrails? Yes   Home free of loose  throw rugs in walkways, pet beds, electrical cords, etc? Yes  Adequate lighting in your home to reduce risk of falls? Yes   ASSISTIVE DEVICES UTILIZED TO PREVENT FALLS:  Life alert? No  Use of a cane, walker or w/c? No  Grab bars in the bathroom? No  Shower chair or bench in shower? No  Elevated toilet seat or a handicapped toilet? No   DME ORDERS:  DME order needed?  No   TIMED UP AND GO:  Was the test performed? Yes .  Length of time to ambulate 10 feet: 6 sec.   GAIT:  Appearance of gait: Gait stead-fast and without the use of an assistive device.    Education: Fall risk prevention has been discussed.  Intervention(s) required? No   Depression Screen PHQ 2/9 Scores 02/01/2019 07/30/2018 06/21/2018 06/15/2018  PHQ - 2 Score 0 0 0 0    Cognitive Function     6CIT Screen 02/01/2019  What Year? 0 points  What month? 0 points  What time? 0 points  Count back from 20 0 points  Months in reverse 0 points  Repeat phrase 0 points  Total Score 0    Immunization History  Administered Date(s) Administered  . Pneumococcal Conjugate-13 02/06/2014  . Pneumococcal Polysaccharide-23 06/08/2012  . Tdap 06/08/2012    Qualifies for Shingles Vaccine? Yes . Due for Shingrix. Education has been provided regarding the importance of this vaccine. Pt has been advised to call insurance company to determine out of pocket expense. Advised may also receive vaccine at local pharmacy or Health Dept. Verbalized acceptance and understanding.  Tdap: Up to date  Flu Vaccine: Due for Flu vaccine. Does the patient want to receive this vaccine today?  No . Education has been provided regarding the importance of this vaccine but still declined. Advised  may receive this vaccine at local pharmacy or Health Dept. Aware to provide a copy of the vaccination record if obtained from local pharmacy or Health Dept. Verbalized acceptance and understanding.  Pneumococcal Vaccine: Up to date   Screening  Tests Health Maintenance  Topic Date Due  . INFLUENZA VACCINE  03/01/2019 (Originally 07/01/2018)  . TETANUS/TDAP  06/08/2022  . PNA vac Low Risk Adult  Completed   Cancer Screenings:  Colorectal Screening:  No longer required.   Lung Cancer Screening: (Low Dose CT Chest recommended if Age 44-80 years, 30 pack-year currently smoking OR have quit w/in 15years.) does not qualify.   Additional Screening:  Hepatitis C Screening: no longer required  Vision Screening: Recommended annual ophthalmology exams for early detection of glaucoma and other disorders of the eye. Is the patient up to date with their annual eye exam?  Yes  Who is the provider or what is the name of the office in which the pt attends annual eye exams? [facility]  Dental Screening: Recommended annual dental exams for proper oral hygiene  Community Resource Referral:  CRR required this visit?  No       Plan:    I have personally reviewed and addressed the Medicare Annual Wellness questionnaire and have noted the following in the patient's chart:  A. Medical and social history B. Use of alcohol, tobacco or illicit drugs  C. Current medications and supplements D. Functional ability and status E.  Nutritional status F.  Physical activity G. Advance directives H. List of other physicians I.  Hospitalizations, surgeries, and ER visits in previous 12 months J.  [employer] such as hearing and vision if needed, cognitive and depression L. Referrals and appointments   In addition, I have reviewed and discussed with patient certain preventive protocols, quality metrics, and best practice recommendations. A written personalized care plan for preventive services as well as general preventive health recommendations were provided to patient.   Signed,  Clemetine Marker, LPN Nurse Health Advisor   Nurse Notes: Pt accompanied to visit today by daughter [name]. Pt doing well and appreciative of visit today.

## 2019-02-15 ENCOUNTER — Other Ambulatory Visit: Payer: Self-pay | Admitting: Family Medicine

## 2019-02-15 NOTE — Telephone Encounter (Signed)
Refill request for general medication: Protonix 40 mg  Last office visit: 07/30/2018  Follow-ups on file. 02/07/2020

## 2019-05-13 ENCOUNTER — Telehealth: Payer: Self-pay

## 2019-05-13 NOTE — Telephone Encounter (Signed)
Copied from [place] [id number]. Topic: General - Other >> May 13, 2019 11:38 AM Jodie Echevaria wrote: Reason for CRM: Patient daughter Cahlil Sattar called to as if it ok with Dr Ancil Boozer for patient to take Naproxen for his Sciatic nerve pain. Please call Ph# [phone] with an answer.

## 2019-05-16 NOTE — Telephone Encounter (Signed)
Patient called.  Patient aware.  

## 2019-05-21 ENCOUNTER — Emergency Department: Payer: Medicare Other

## 2019-05-21 ENCOUNTER — Other Ambulatory Visit: Payer: Self-pay

## 2019-05-21 ENCOUNTER — Inpatient Hospital Stay
Admission: EM | Admit: 2019-05-21 | Discharge: 2019-05-27 | DRG: 064 | Disposition: A | Discharge status: Rehabilitation Facility | Payer: Medicare Other | Attending: Nurse Practitioner | Admitting: Nurse Practitioner

## 2019-05-21 DIAGNOSIS — Z823 Family history of stroke: Secondary | ICD-10-CM

## 2019-05-21 DIAGNOSIS — G8194 Hemiplegia, unspecified affecting left nondominant side: Secondary | ICD-10-CM | POA: Diagnosis present

## 2019-05-21 DIAGNOSIS — I739 Peripheral vascular disease, unspecified: Secondary | ICD-10-CM | POA: Diagnosis present

## 2019-05-21 DIAGNOSIS — K5900 Constipation, unspecified: Secondary | ICD-10-CM | POA: Diagnosis present

## 2019-05-21 DIAGNOSIS — R29706 NIHSS score 6: Secondary | ICD-10-CM | POA: Diagnosis present

## 2019-05-21 DIAGNOSIS — J690 Pneumonitis due to inhalation of food and vomit: Secondary | ICD-10-CM

## 2019-05-21 DIAGNOSIS — R4702 Dysphasia: Secondary | ICD-10-CM | POA: Diagnosis present

## 2019-05-21 DIAGNOSIS — N182 Chronic kidney disease, stage 2 (mild): Secondary | ICD-10-CM | POA: Diagnosis present

## 2019-05-21 DIAGNOSIS — G936 Cerebral edema: Secondary | ICD-10-CM | POA: Diagnosis present

## 2019-05-21 DIAGNOSIS — R0902 Hypoxemia: Secondary | ICD-10-CM

## 2019-05-21 DIAGNOSIS — N400 Enlarged prostate without lower urinary tract symptoms: Secondary | ICD-10-CM | POA: Diagnosis present

## 2019-05-21 DIAGNOSIS — I4891 Unspecified atrial fibrillation: Secondary | ICD-10-CM | POA: Diagnosis present

## 2019-05-21 DIAGNOSIS — I130 Hypertensive heart and chronic kidney disease with heart failure and stage 1 through stage 4 chronic kidney disease, or unspecified chronic kidney disease: Secondary | ICD-10-CM | POA: Diagnosis present

## 2019-05-21 DIAGNOSIS — I63411 Cerebral infarction due to embolism of right middle cerebral artery: Principal | ICD-10-CM | POA: Diagnosis present

## 2019-05-21 DIAGNOSIS — R471 Dysarthria and anarthria: Secondary | ICD-10-CM | POA: Diagnosis present

## 2019-05-21 DIAGNOSIS — I63511 Cerebral infarction due to unspecified occlusion or stenosis of right middle cerebral artery: Secondary | ICD-10-CM | POA: Diagnosis present

## 2019-05-21 DIAGNOSIS — R1310 Dysphagia, unspecified: Secondary | ICD-10-CM | POA: Diagnosis present

## 2019-05-21 DIAGNOSIS — E785 Hyperlipidemia, unspecified: Secondary | ICD-10-CM | POA: Diagnosis present

## 2019-05-21 DIAGNOSIS — Z79899 Other long term (current) drug therapy: Secondary | ICD-10-CM

## 2019-05-21 DIAGNOSIS — R0602 Shortness of breath: Secondary | ICD-10-CM

## 2019-05-21 DIAGNOSIS — Z7189 Other specified counseling: Secondary | ICD-10-CM

## 2019-05-21 DIAGNOSIS — R531 Weakness: Secondary | ICD-10-CM | POA: Diagnosis not present

## 2019-05-21 DIAGNOSIS — I248 Other forms of acute ischemic heart disease: Secondary | ICD-10-CM | POA: Diagnosis present

## 2019-05-21 DIAGNOSIS — I4892 Unspecified atrial flutter: Secondary | ICD-10-CM

## 2019-05-21 DIAGNOSIS — Z86718 Personal history of other venous thrombosis and embolism: Secondary | ICD-10-CM

## 2019-05-21 DIAGNOSIS — K219 Gastro-esophageal reflux disease without esophagitis: Secondary | ICD-10-CM | POA: Diagnosis present

## 2019-05-21 DIAGNOSIS — I639 Cerebral infarction, unspecified: Secondary | ICD-10-CM

## 2019-05-21 DIAGNOSIS — Z950 Presence of cardiac pacemaker: Secondary | ICD-10-CM

## 2019-05-21 DIAGNOSIS — R29810 Facial weakness: Secondary | ICD-10-CM | POA: Diagnosis present

## 2019-05-21 DIAGNOSIS — Z8673 Personal history of transient ischemic attack (TIA), and cerebral infarction without residual deficits: Secondary | ICD-10-CM

## 2019-05-21 DIAGNOSIS — Z87891 Personal history of nicotine dependence: Secondary | ICD-10-CM

## 2019-05-21 DIAGNOSIS — Z1159 Encounter for screening for other viral diseases: Secondary | ICD-10-CM

## 2019-05-21 DIAGNOSIS — Z515 Encounter for palliative care: Secondary | ICD-10-CM

## 2019-05-21 LAB — URINALYSIS, ROUTINE W REFLEX MICROSCOPIC
Bilirubin Urine: NEGATIVE
Glucose, UA: 50 mg/dL — AB
Ketones, ur: 5 mg/dL — AB
Leukocytes,Ua: NEGATIVE
Nitrite: NEGATIVE
Protein, ur: 100 mg/dL — AB
Specific Gravity, Urine: 1.023 (ref 1.005–1.030)
pH: 5 (ref 5.0–8.0)

## 2019-05-21 LAB — CBC
HCT: 53.5 % — ABNORMAL HIGH (ref 39.0–52.0)
Hemoglobin: 18.6 g/dL — ABNORMAL HIGH (ref 13.0–17.0)
MCH: 30.4 pg (ref 26.0–34.0)
MCHC: 34.8 g/dL (ref 30.0–36.0)
MCV: 87.6 fL (ref 80.0–100.0)
Platelets: 309 10*3/uL (ref 150–400)
RBC: 6.11 MIL/uL — ABNORMAL HIGH (ref 4.22–5.81)
RDW: 13.6 % (ref 11.5–15.5)
WBC: 25.4 10*3/uL — ABNORMAL HIGH (ref 4.0–10.5)
nRBC: 0 % (ref 0.0–0.2)

## 2019-05-21 LAB — URINE DRUG SCREEN, QUALITATIVE (ARMC ONLY)
Amphetamines, Ur Screen: NOT DETECTED
Barbiturates, Ur Screen: NOT DETECTED
Benzodiazepine, Ur Scrn: NOT DETECTED
Cannabinoid 50 Ng, Ur ~~LOC~~: NOT DETECTED
Cocaine Metabolite,Ur ~~LOC~~: NOT DETECTED
MDMA (Ecstasy)Ur Screen: NOT DETECTED
Methadone Scn, Ur: NOT DETECTED
Opiate, Ur Screen: NOT DETECTED
Phencyclidine (PCP) Ur S: NOT DETECTED
Tricyclic, Ur Screen: NOT DETECTED

## 2019-05-21 LAB — DIFFERENTIAL
Abs Immature Granulocytes: 0.33 10*3/uL — ABNORMAL HIGH (ref 0.00–0.07)
Basophils Absolute: 0.1 10*3/uL (ref 0.0–0.1)
Basophils Relative: 0 %
Eosinophils Absolute: 0 10*3/uL (ref 0.0–0.5)
Eosinophils Relative: 0 %
Immature Granulocytes: 1 %
Lymphocytes Relative: 3 %
Lymphs Abs: 0.9 10*3/uL (ref 0.7–4.0)
Monocytes Absolute: 2.2 10*3/uL — ABNORMAL HIGH (ref 0.1–1.0)
Monocytes Relative: 9 %
Neutro Abs: 21.9 10*3/uL — ABNORMAL HIGH (ref 1.7–7.7)
Neutrophils Relative %: 87 %
Smear Review: NORMAL

## 2019-05-21 LAB — COMPREHENSIVE METABOLIC PANEL
ALT: 20 U/L (ref 0–44)
AST: 45 U/L — ABNORMAL HIGH (ref 15–41)
Albumin: 4 g/dL (ref 3.5–5.0)
Alkaline Phosphatase: 78 U/L (ref 38–126)
Anion gap: 16 — ABNORMAL HIGH (ref 5–15)
BUN: 28 mg/dL — ABNORMAL HIGH (ref 8–23)
CO2: 20 mmol/L — ABNORMAL LOW (ref 22–32)
Calcium: 8.9 mg/dL (ref 8.9–10.3)
Chloride: 101 mmol/L (ref 98–111)
Creatinine, Ser: 1.28 mg/dL — ABNORMAL HIGH (ref 0.61–1.24)
GFR calc Af Amer: 57 mL/min — ABNORMAL LOW (ref >60)
GFR calc non Af Amer: 49 mL/min — ABNORMAL LOW (ref >60)
Glucose, Bld: 170 mg/dL — ABNORMAL HIGH (ref 70–99)
Potassium: 3.6 mmol/L (ref 3.5–5.1)
Sodium: 137 mmol/L (ref 135–145)
Total Bilirubin: 1.5 mg/dL — ABNORMAL HIGH (ref 0.3–1.2)
Total Protein: 7.2 g/dL (ref 6.5–8.1)

## 2019-05-21 LAB — CK: Total CK: 970 U/L — ABNORMAL HIGH (ref 49–397)

## 2019-05-21 LAB — PROTIME-INR
INR: 1.1 (ref 0.8–1.2)
Prothrombin Time: 13.8 seconds (ref 11.4–15.2)

## 2019-05-21 LAB — SARS CORONAVIRUS 2 BY RT PCR (HOSPITAL ORDER, PERFORMED IN ~~LOC~~ HOSPITAL LAB): SARS Coronavirus 2: NEGATIVE

## 2019-05-21 LAB — BRAIN NATRIURETIC PEPTIDE: B Natriuretic Peptide: 828 pg/mL — ABNORMAL HIGH (ref 0.0–100.0)

## 2019-05-21 LAB — APTT: aPTT: 25 seconds (ref 24–36)

## 2019-05-21 LAB — GLUCOSE, CAPILLARY: Glucose-Capillary: 166 mg/dL — ABNORMAL HIGH (ref 70–99)

## 2019-05-21 MED ORDER — LEVOFLOXACIN IN D5W 750 MG/150ML IV SOLN
750.0000 mg | Freq: Once | INTRAVENOUS | Status: AC
  Administered 2019-05-21: 750 mg via INTRAVENOUS
  Filled 2019-05-21: qty 150

## 2019-05-21 MED ORDER — ASPIRIN 300 MG RE SUPP
300.0000 mg | Freq: Once | RECTAL | Status: AC
  Administered 2019-05-21: 300 mg via RECTAL
  Filled 2019-05-21: qty 1

## 2019-05-21 NOTE — ED Notes (Signed)
Pt weaned down to 5L Jose Castro

## 2019-05-21 NOTE — Progress Notes (Signed)
CODE STROKE- PHARMACY COMMUNICATION   Time CODE STROKE called/page received:1734  Time response to CODE STROKE was made (in person or via phone): 1740  Time Stroke Kit retrieved from [place] (only if needed): None ordered Name of Provider/Nurse contacted: Went to floor  Past Medical History:  Diagnosis Date  . Arthritis   . BPH (benign prostatic hyperplasia)   . Chronic kidney disease    had a kidney stone which per family was a cyst that was removed  . Dysrhythmia   . GERD (gastroesophageal reflux disease)   . History of kidney stones   . Hyperlipidemia   . Hypertension   . Splenic vein thrombosis   . Thrombosis 12/2015   mural  area and no notation of heart attack   Prior to Admission medications   Medication Sig Start Date End Date Taking? Authorizing Provider  Cholecalciferol (VITAMIN D3) 2000 units TABS Take 2,000 Units by mouth daily.     [provider]  CIALIS 5 MG tablet Take 5 mg by mouth daily as needed for erectile dysfunction.  06/11/15   [provider]  finasteride (PROSCAR) 5 MG tablet Take 5 mg by mouth daily.  06/11/15   [provider]  lisinopril (PRINIVIL,ZESTRIL) 20 MG tablet Take 1 tablet by mouth daily. 01/28/19   [provider]  Omega-3 Fatty Acids (FISH OIL) 1000 MG CAPS Take 1,000 mg by mouth daily.     [provider]  pantoprazole (PROTONIX) 40 MG tablet TAKE ONE TABLET EVERY DAY 02/15/19   Steele Sizer, MD    Lu Duffel ,PharmD Clinical Pharmacist  05/21/2019  6:06 PM

## 2019-05-21 NOTE — Consult Note (Signed)
[facility] Consult Services   Date of Service:   05/21/2019 18:03:17  Impression:     .  Right Hemispheric Infarct     .  MCA Distribution Infarct  Comments/Sign-Out: Unfortunately the patient is well past opportunity for tPA and noting the appearance of a large core infarct he would not be a candidate for an interventional procedure. His likely cardioembolic noting that he is a pacemaker but with underlying possible atrial flutter. He needs conservative evaluation and treatment. He may rehab well.  Mechanism of Stroke: Possible Cardioembolic  Metrics: Last Known Well: 05/20/2019 19:00:00 [facility] Notification Time: 05/21/2019 18:03:17 Arrival Time: 05/21/2019 17:22:00 Stamp Time: 05/21/2019 18:03:17 Time First Login Attempt: 05/21/2019 18:09:28 Video Start Time: 05/21/2019 18:09:28  Symptoms: Right gaze NIHSS Start Assessment Time: 05/21/2019 18:19:44 Patient is not a candidate for tPA. Patient was not deemed candidate for tPA thrombolytics because of Last Well Known Above 4.5 Hours. Video End Time: 05/21/2019 18:24:35  CT head was reviewed and results were: There is a subacute infarction involving much of the right temporal lobe and adjacent structures. There is underlying small vessel disease. There is no hemorrhage. There may be right ICA and MCA occlusion.  Clinical Presentation is Suggestive of Large Vessel Occlusive Disease, Recommendations are as Follows  Advanced Imaging to be Reviewed by ED Provider and NIR.   Radiologist was not called back for review of advanced imaging because There is already a large core infarction and he has almost at 24 hours since last known normal so there is no clear indication for intervention. ED Physician notified of diagnostic impression and management plan on 05/21/2019 18:28:08  Our recommendations are outlined below.  Recommendations:     .  Activate Stroke Protocol Admission/Order Set   .  Stroke/Telemetry Floor     .  Neuro Checks     .  Bedside Swallow Eval     .  DVT Prophylaxis     .  IV Fluids, Normal Saline     .  Head of Bed 30 Degrees     .  Euglycemia and Avoid Hyperthermia (PRN Acetaminophen)     .  Antiplatelet Therapy Recommended     .  Initiate Aspirin 81 MG Daily  Routine Consultation with [facility] for Follow up Care  Sign Out:     .  Discussed with Emergency Department Provider    ------------------------------------------------------------------------------  History of Present Illness: Patient is a 85 year old Male.  Patient was brought by EMS for symptoms of Right gaze  This is a 85 year old man lives independently. He was last seen normal yesterday at 1900 by family and found down today with a right gaze preference. He has a history of hypertension. He is not diabetic. He sets and carotid disease. He has a pacemaker. He ED physician thought he had underlying atrial flutter possibly. He has peripheral vascular and chronic kidney disease.  Last seen normal was beyond 4.5 hours of presentation. There is no history of hemorrhagic complications or intracranial hemorrhage. There is no history of Recent Anticoagulants. There is no history of recent major surgery. There is no history of recent stroke.  Examination: BP(139/91), Blood Glucose(170) 1A: Level of Consciousness - Alert; keenly responsive + 0 1B: Ask Month and Age - Both Questions Right + 0 1C: Blink Eyes & Squeeze Hands - Performs Both Tasks + 0 2: Test Horizontal Extraocular Movements - Forced Gaze Palsy: Cannot Be Overcome + 2 3: Test Visual Fields - No Visual  Loss + 0 4: Test Facial Palsy (Use Grimace if Obtunded) - Normal symmetry + 0 5A: Test Left Arm Motor Drift - No Drift for 10 Seconds + 0 5B: Test Right Arm Motor Drift - No Drift for 10 Seconds + 0 6A: Test Left Leg Motor Drift - Drift, but doesn't hit bed + 1 6B: Test Right Leg Motor Drift - No Drift for 5 Seconds +  0 7: Test Limb Ataxia (FNF/Heel-Shin) - Ataxia in 1 Limb + 1 8: Test Sensation - Normal; No sensory loss + 0 9: Test Language/Aphasia - Normal; No aphasia + 0 10: Test Dysarthria - Mild-Moderate Dysarthria: Slurring but can be understood + 1 11: Test Extinction/Inattention - Visual/tactile/auditory/spatial/personal inattention + 1  NIHSS Score: 6  Patient/Family was informed the Neurology Consult would happen via TeleHealth consult by way of interactive audio and video telecommunications and consented to receiving care in this manner.  Due to the immediate potential for life-threatening deterioration due to underlying acute neurologic illness, I spent 35 minutes providing critical care. This time includes time for face to face visit via telemedicine, review of medical records, imaging studies and discussion of findings with providers, the patient and/or family.   Dr Cindie Laroche   [facility] [phone]  Case [id number]

## 2019-05-21 NOTE — ED Notes (Signed)
Per family pt LKW was 7:00 PM

## 2019-05-21 NOTE — ED Provider Notes (Signed)
[facility] Emergency Department Provider Note   ____________________________________________   First MD Initiated Contact with Patient 05/21/19 1731     (approximate)  I have reviewed the triage vital signs and the nursing notes.   HISTORY  Chief Complaint Fall  EM caveat: Patient some confusion or recollection history primarily collected from family and EMS  HPI Jose Castro is a 85 y.o. male here for evaluation of being found on the floor  EMS reports patient was last seen well at about 1 PM according to family yesterday.  He was found on the floor today.  EMS noticed he had a fixed eye deviation to the right and is concerned he could have had an acute stroke but do note that he is greater than 24 hours out  The patient knows the year, not too familiar with situation but seems to report that he was on the ground throughout the day and night.  He cannot seem to recall too well though.  He seems slightly confused  Patient denies any pain.  No chest pain.  Denies noticing any trouble breathing.  Denies headache   Spoken clarified with his daughter Jose Castro.  The patient was last seen well at 7 PM last night, he was actually seen but they talk to him on the phone and he was okay at that time.  He did not answer the phone earlier today, so thus they went over to visit and check on him and he was found on the ground this afternoon.  He has a history of a pacemaker.  Hypertension otherwise has been quite healthy and his family attends to him.  He does not have a set medical power of attorney but does have 6 children, several of whom participate in his care  Past Medical History:  Diagnosis Date  . Arthritis   . BPH (benign prostatic hyperplasia)   . Chronic kidney disease    had a kidney stone which per family was a cyst that was removed  . Dysrhythmia   . GERD (gastroesophageal reflux disease)   . History of kidney stones   . Hyperlipidemia   .  Hypertension   . Splenic vein thrombosis   . Thrombosis 12/2015   mural  area and no notation of heart attack    Patient Active Problem List   Diagnosis Date Noted  . Persistent proteinuria 07/30/2018  . 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 Chronic kidney disease, stage III (moderate) ([place]) 06/15/2018  . Leg pain 05/06/2018  . Lymphedema 05/06/2018  . Coagulopathy ([place]) 07/07/2017  . Lumbar spondylosis 03/10/2017  . Elevated uric acid in blood 11/20/2016  . BPH (benign prostatic hyperplasia) 05/29/2016  . Peripheral vascular disease of lower extremity ([place]) 03/03/2016  . Gallstone 12/31/2015  . 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 Atherosclerosis of aorta ([place]) 11/22/2015  . Carotid artery narrowing 11/22/2015  . Diverticulosis of colon 11/22/2015  . Decreased creatinine clearance 11/22/2015  . Arthritis, degenerative 11/22/2015  . Lactose intolerance 11/22/2015  . Basal cell carcinoma 11/22/2015  . Essential hypertension 06/21/2015  . Hyperlipemia 06/21/2015  . GERD (gastroesophageal reflux disease) 06/21/2015  . Calculus of kidney 11/18/2013    Past Surgical History:  Procedure Laterality Date  . APPENDECTOMY    . CATARACT EXTRACTION, BILATERAL    . CYSTOSCOPY WITH INSERTION OF UROLIFT    . KIDNEY STONE SURGERY    . KNEE SURGERY    . PACEMAKER  INSERTION N/A 07/13/2018   Procedure: INSERTION PACEMAKER-DUEL CHAMBER INITIAL IMPLANT;  Surgeon: Isaias Cowman, MD;  Location: [facility] ORS;  Service: Cardiovascular;  Laterality: N/A;  . SPINE SURGERY      Prior to Admission medications   Medication Sig Start Date End Date Taking? Authorizing Provider  Cholecalciferol (VITAMIN D3) 2000 units TABS Take 2,000 Units by mouth daily.     [provider]  CIALIS 5 MG tablet Take 5 mg by mouth daily as needed for erectile dysfunction.  06/11/15   [provider]  finasteride (PROSCAR) 5 MG tablet Take 5 mg by mouth  daily.  06/11/15   [provider]  lisinopril (PRINIVIL,ZESTRIL) 20 MG tablet Take 1 tablet by mouth daily. 01/28/19   [provider]  Omega-3 Fatty Acids (FISH OIL) 1000 MG CAPS Take 1,000 mg by mouth daily.     [provider]  pantoprazole (PROTONIX) 40 MG tablet TAKE ONE TABLET EVERY DAY 02/15/19   Steele Sizer, MD    Allergies Penicillins  Family History  Problem Relation Age of Onset  . Cancer Brother        bladder cancer with mets  . Heart disease Mother   . Aortic aneurysm Mother   . Heart attack Maternal Aunt   . Heart attack Maternal Uncle     Social History Social History   Tobacco Use  . Smoking status: Former Smoker    Years: 16.00    Types: Cigarettes    Start date: 10/21/1944    Quit date: 10/21/1960    Years since quitting: 58.6  . Smokeless tobacco: Never Used  Substance Use Topics  . Alcohol use: Yes    Alcohol/week: 3.0 standard drinks    Types: 3 Standard drinks or equivalent per week    Comment: once a week when he goes out to dinner-socially  . Drug use: No    Review of Systems EM caveat   ____________________________________________   PHYSICAL EXAM:  VITAL SIGNS: ED Triage Vitals  Enc Vitals Group     BP --      Castro Rate 05/21/19 1730 68     Resp 05/21/19 1730 20     Temp 05/21/19 1730 98.7 F (37.1 C)     Temp Source 05/21/19 1730 Oral     SpO2 05/21/19 1729 91 %     Weight 05/21/19 1731 182 lb (82.6 kg)     Height 05/21/19 1731 5\' 10"  (1.778 m)     Head Circumference --      Peak Flow --      Pain Score 05/21/19 1731 0     Pain Loc --      Pain Edu? --      Excl. in [place]? --     Constitutional: Alert and oriented to self but not to situation.  Modestly ill-appearing with an obvious gaze deviation to the right eyes: Conjunctivae are normal.  Right-sided gaze deviation bilateral Head: Atraumatic. Nose: No congestion/rhinnorhea. Mouth/Throat: Mucous membranes are slightly dry. Neck: No stridor.   Cardiovascular: Normal rate, regular rhythm. Grossly normal heart sounds.  Good peripheral circulation. Respiratory: Normal respiratory effort.  No retractions.  Mild rales in the bases bilateral.  He has a moderate oxygen requirement.  He is handling secretions well, is alert and does not appear to exhibit acute respiratory distress.  There is no wheezing. Gastrointestinal: Soft and nontender. No distention. Musculoskeletal: No lower extremity tenderness nor edema.  Seems to identify right-sided extremities better than left.  Seems to have some potential neglect very mild on the left. Neurologic:  Normal speech and language though somewhat confused. No gross focal neurologic deficits are appreciated except for fixed deviation to the right, some neglect that seems slight on the left side also question some very slight left upper extremity weakness compared to the right.  NIH equals 6 and his Lucianne Lei score is suspicious for LVO Skin:  Skin is warm, dry and intact. No rash noted. Psychiatric: Mood and affect are slightly anxious, a little confused.  ____________________________________________   LABS (all labs ordered are listed, but only abnormal results are displayed)  Labs Reviewed  CBC - Abnormal; Notable for the following components:      Result Value   WBC 25.4 (*)    RBC 6.11 (*)    Hemoglobin 18.6 (*)    HCT 53.5 (*)    All other components within normal limits  DIFFERENTIAL - Abnormal; Notable for the following components:   Neutro Abs 21.9 (*)    Monocytes Absolute 2.2 (*)    Abs Immature Granulocytes 0.33 (*)    All other components within normal limits  COMPREHENSIVE METABOLIC PANEL - Abnormal; Notable for the following components:   CO2 20 (*)    Glucose, Bld 170 (*)    BUN 28 (*)    Creatinine, Ser 1.28 (*)    AST 45 (*)    Total Bilirubin 1.5 (*)    GFR calc non Af Amer 49 (*)    GFR calc Af Amer 57 (*)    Anion gap 16 (*)    All other components within normal limits   URINALYSIS, ROUTINE W REFLEX MICROSCOPIC - Abnormal; Notable for the following components:   Color, Urine YELLOW (*)    APPearance HAZY (*)    Glucose, UA 50 (*)    Hgb urine dipstick MODERATE (*)    Ketones, ur 5 (*)    Protein, ur 100 (*)    Bacteria, UA RARE (*)    All other components within normal limits  CK - Abnormal; Notable for the following components:   Total CK 970 (*)    All other components within normal limits  BRAIN NATRIURETIC PEPTIDE - Abnormal; Notable for the following components:   B Natriuretic Peptide 828.0 (*)    All other components within normal limits  GLUCOSE, CAPILLARY - Abnormal; Notable for the following components:   Glucose-Capillary 166 (*)    All other components within normal limits  SARS CORONAVIRUS 2 (HOSPITAL ORDER, [place] LAB)  CULTURE, BLOOD (ROUTINE X 2)  CULTURE, BLOOD (ROUTINE X 2)  PROTIME-INR  APTT  URINE DRUG SCREEN, QUALITATIVE ([facility] ONLY)  ETHANOL  CBG MONITORING, ED   ____________________________________________  EKG  Reviewed entered by me at 1732 Heart rate 65 QRS 200 QTc 500 Ventricular pacing, appears underlying atrial flutter.  In discussion with his family the atrial flutter appears to be a new finding ____________________________________________  RADIOLOGY  Dg Chest Portable 1 View  Result Date: 05/21/2019 CLINICAL DATA:  Unwitnessed fall. EXAM: PORTABLE CHEST 1 VIEW COMPARISON:  07/17/2018 FINDINGS: Stable dual lead cardiac pacemaker. Cardiomediastinal silhouette is normal. Mediastinal contours appear intact. Calcific atherosclerotic disease of the aorta. There is no evidence of focal airspace consolidation, pleural effusion or pneumothorax. Mildly increased interstitial markings. Osseous structures are without acute abnormality. Soft tissues are grossly normal. IMPRESSION: Mildly increased interstitial markings which may be seen with pulmonary vascular congestion. Electronically Signed    By: Thomas Hoff  Dimitrova M.D.   On: 05/21/2019 18:26   Ct Head Code Stroke Wo Contrast  Result Date: 05/21/2019 CLINICAL DATA:  Code stroke. Unwitnessed fall at home. Last seen normal 7 p.m. 05/20/2019. Clinically, RIGHT gaze preference with LEFT hemiparesis. EXAM: CT HEAD WITHOUT CONTRAST TECHNIQUE: Contiguous axial images were obtained from the base of the skull through the vertex without intravenous contrast. COMPARISON:  None. FINDINGS: The patient was unable to remain motionless for the exam. Small or subtle lesions could be overlooked. Assessment of ASPECTS score is limited. Brain: There are multiple areas of hypoattenuation, in the RIGHT hemisphere, consistent with acute infarction. These involve the RIGHT basal ganglia and internal capsule, insula, anterior and posterior temporal lobe, without visible hemorrhage. Elsewhere there is generalized atrophy with chronic microvascular ischemic change. BILATERAL extra-axial CSF collections likely represent atrophy rather than hygromas. Vascular: Calcification of the cavernous internal carotid arteries consistent with cerebrovascular atherosclerotic disease. Asymmetric hyperattenuation of the RIGHT ICA and MCA could represent a large vessel occlusion, but assessment is limited due to motion. Skull: Intact Sinuses/Orbits: No acute finding. Other: None. ASPECTS [facility] Stroke Program Early CT Score) - Ganglionic level infarction (caudate, lentiform nuclei, internal capsule, insula, M1-M3 cortex): 3 - Supraganglionic infarction (M4-M6 cortex): 3 Total score (0-10 with 10 being normal): 6 IMPRESSION: 1. Motion degraded exam demonstrating multiple areas of acute nonhemorrhagic infarction in the RIGHT hemisphere. 2. ASPECTS is 6, could be less.  Assessment is limited. 3. Asymmetric hyperattenuation of the RIGHT ICA and MCA could represent large vessel occlusion. These results were called by telephone at the time of interpretation on 05/21/2019 at 6:10 pm to Dr. Delman Kitten , who verbally acknowledged these results. * Electronically Signed   By: Staci Righter M.D.   On: 05/21/2019 18:15    Discussed with radiologist.  Chest x-ray noticed possible mild vascular congestion, however in the setting of elevated white count with his hypoxia and his family assuring me that he was in good and normal health until today, I am suspicious this could instead represent infectious etiology and I do not see evidence of peripheral edema or signs or symptoms of clearly support this being volume overload.  I will however add BNP ____________________________________________   PROCEDURES  Procedure(s) performed: None  Procedures  Critical Care performed: Yes, see critical care note(s)  CRITICAL CARE Performed by: Delman Kitten   Total critical care time: 65 minutes  Critical care time was exclusive of separately billable procedures and treating other patients.  Critical care was necessary to treat or prevent imminent or life-threatening deterioration.  Critical care was time spent personally by me on the following activities: development of treatment plan with patient and/or surrogate as well as nursing, discussions with consultants, evaluation of patient's response to treatment, examination of patient, obtaining history from patient or surrogate, ordering and performing treatments and interventions, ordering and review of laboratory studies, ordering and review of radiographic studies, Castro oximetry and re-evaluation of patient's condition.  Extensive critical care time.  Acute stroke, required discussion with tele-neurology, radiology, close neurologic assessment.  Decision point discussed with tele-neurologist, the patient is not a candidate for thrombectomy given his presentation, timing, and CT findings tele-neurology Dr. Lucia Gaskins advises conservative treatment with salicylate and no thrombectomy  Patient required extensive critical care, has acute stroke.   Consideration for thrombectomy for which he is not a candidate.  Not a TPA candidate he is well outside of the window for last known well.  I had to discuss plan of treatment  with multiple family members, multiple discussions back-and-forth agreeable to come to agreement through the family that he would be admitted here, granddaughter will be able to visit with him on a regular basis as she is also a nurse with cardiac experience. ____________________________________________   INITIAL IMPRESSION / [place] / ED COURSE  Pertinent labs & imaging results that were available during my care of the patient were reviewed by me and considered in my medical decision making (see chart for details).     Clinical Course as of May 20 2208  Sat May 21, 2019  1809 Radiology, Dr. Beatris Ship advises R hemispheric edema and suspect R MCA infarct. Possible LVO, can't fully tell.   [MQ]  1810 Dr. Beatris Ship advises that CT head angio would be very difficult to interpret if patient had motion artifact.    [MQ]  1827 Case and care discussed with Dr. Lucia Gaskins of tele-stroke.  The patient is not a candidate for neuro intervention due to his imaging findings and late presentation.  Patient remains alert, oriented.  He appears improved more calm now.  Conservative care including salicylate therapy presently recommended by Dr. Lucia Gaskins.  Await swallow study   [MQ]  1937 The patient's family in agreement, daughter Jose Castro was communicated with other family members and also his granddaughter Jose Castro was also communicated with other family and are in agreement understanding of plan for admission here.  According to tele-neurology, there is no indication for advanced stroke therapy or thrombectomy.  The patient appears improved, calm now and family at the bedside as well, oxygenating 100% at present.  Await COVID test prior to disposition   [MQ]    Clinical Course User Index [MQ] Delman Kitten, MD    Patient presentation appears  to be consistent with a acute right sided stroke.  I also suspect given his failure of the swallow study that he may have aspirated leading to infection with elevated white count and hypoxia as well.  Overall his condition is guarded, discussed case and care and admission with [facility] physician assistant on the hospitalist service.  COVID negative.  Updated family multiple times, plan of care developed with the family and he will be admitted here for further care and treatment.   ____________________________________________   FINAL CLINICAL IMPRESSION(S) / ED DIAGNOSES  Final diagnoses:  Hypoxia  Stroke, acute, embolic (HCC)  Atrial flutter, unspecified type ([place])  Suspect aspiration Community-acquired pneumonia      Note:  This document was prepared using Dragon voice recognition software and may include unintentional dictation errors       Delman Kitten, MD 05/21/19 2211

## 2019-05-21 NOTE — ED Triage Notes (Signed)
Pt arrives via EMS after having an unwitnessed fall at home- LKW was 1:00 yesterday evening- pt having some right sided gaze shift- pt not able to move eyes to the left- pt desatted and placed on a nonrebreather per EMS- pt satting 95% on nonrebreather- pt alert and oriented

## 2019-05-21 NOTE — ED Notes (Signed)
Pt symptoms are improving since arrival. Pt able to hold some gaze to the left, as well as move his left arm more fully. Pt speech is easier understood and pt also has some feeling back to his left foot. Pt able to walk to restroom to have a BM with x3 assistance to bathroom and x2 assistance back. Pt oxygen able to be decreased to 3L [place].

## 2019-05-22 ENCOUNTER — Inpatient Hospital Stay
Admit: 2019-05-22 | Discharge: 2019-05-22 | Disposition: A | Discharge status: Home / Self Care | Payer: Medicare Other | Attending: Nurse Practitioner | Admitting: Nurse Practitioner

## 2019-05-22 ENCOUNTER — Inpatient Hospital Stay: Payer: Medicare Other

## 2019-05-22 ENCOUNTER — Other Ambulatory Visit: Payer: Self-pay

## 2019-05-22 ENCOUNTER — Encounter: Payer: Self-pay | Admitting: Nurse Practitioner

## 2019-05-22 DIAGNOSIS — I739 Peripheral vascular disease, unspecified: Secondary | ICD-10-CM | POA: Diagnosis present

## 2019-05-22 DIAGNOSIS — K219 Gastro-esophageal reflux disease without esophagitis: Secondary | ICD-10-CM | POA: Diagnosis present

## 2019-05-22 DIAGNOSIS — R531 Weakness: Secondary | ICD-10-CM | POA: Diagnosis present

## 2019-05-22 DIAGNOSIS — N182 Chronic kidney disease, stage 2 (mild): Secondary | ICD-10-CM | POA: Diagnosis present

## 2019-05-22 DIAGNOSIS — I63411 Cerebral infarction due to embolism of right middle cerebral artery: Secondary | ICD-10-CM | POA: Diagnosis present

## 2019-05-22 DIAGNOSIS — I4892 Unspecified atrial flutter: Secondary | ICD-10-CM | POA: Diagnosis present

## 2019-05-22 DIAGNOSIS — G936 Cerebral edema: Secondary | ICD-10-CM | POA: Diagnosis present

## 2019-05-22 DIAGNOSIS — J690 Pneumonitis due to inhalation of food and vomit: Secondary | ICD-10-CM | POA: Diagnosis present

## 2019-05-22 DIAGNOSIS — R4702 Dysphasia: Secondary | ICD-10-CM | POA: Diagnosis present

## 2019-05-22 DIAGNOSIS — N400 Enlarged prostate without lower urinary tract symptoms: Secondary | ICD-10-CM | POA: Diagnosis present

## 2019-05-22 DIAGNOSIS — E785 Hyperlipidemia, unspecified: Secondary | ICD-10-CM | POA: Diagnosis present

## 2019-05-22 DIAGNOSIS — I4891 Unspecified atrial fibrillation: Secondary | ICD-10-CM | POA: Diagnosis not present

## 2019-05-22 DIAGNOSIS — I130 Hypertensive heart and chronic kidney disease with heart failure and stage 1 through stage 4 chronic kidney disease, or unspecified chronic kidney disease: Secondary | ICD-10-CM | POA: Diagnosis present

## 2019-05-22 DIAGNOSIS — Z950 Presence of cardiac pacemaker: Secondary | ICD-10-CM | POA: Diagnosis not present

## 2019-05-22 DIAGNOSIS — R29810 Facial weakness: Secondary | ICD-10-CM | POA: Diagnosis present

## 2019-05-22 DIAGNOSIS — Z823 Family history of stroke: Secondary | ICD-10-CM | POA: Diagnosis not present

## 2019-05-22 DIAGNOSIS — I248 Other forms of acute ischemic heart disease: Secondary | ICD-10-CM | POA: Diagnosis present

## 2019-05-22 DIAGNOSIS — Z87891 Personal history of nicotine dependence: Secondary | ICD-10-CM | POA: Diagnosis not present

## 2019-05-22 DIAGNOSIS — G8194 Hemiplegia, unspecified affecting left nondominant side: Secondary | ICD-10-CM | POA: Diagnosis present

## 2019-05-22 DIAGNOSIS — I639 Cerebral infarction, unspecified: Secondary | ICD-10-CM | POA: Diagnosis not present

## 2019-05-22 DIAGNOSIS — Z7189 Other specified counseling: Secondary | ICD-10-CM | POA: Diagnosis not present

## 2019-05-22 DIAGNOSIS — R471 Dysarthria and anarthria: Secondary | ICD-10-CM | POA: Diagnosis present

## 2019-05-22 DIAGNOSIS — R29706 NIHSS score 6: Secondary | ICD-10-CM | POA: Diagnosis present

## 2019-05-22 DIAGNOSIS — Z515 Encounter for palliative care: Secondary | ICD-10-CM | POA: Diagnosis not present

## 2019-05-22 DIAGNOSIS — Z8673 Personal history of transient ischemic attack (TIA), and cerebral infarction without residual deficits: Secondary | ICD-10-CM

## 2019-05-22 DIAGNOSIS — Z86718 Personal history of other venous thrombosis and embolism: Secondary | ICD-10-CM | POA: Diagnosis not present

## 2019-05-22 DIAGNOSIS — Z1159 Encounter for screening for other viral diseases: Secondary | ICD-10-CM | POA: Diagnosis not present

## 2019-05-22 DIAGNOSIS — K5900 Constipation, unspecified: Secondary | ICD-10-CM | POA: Diagnosis present

## 2019-05-22 DIAGNOSIS — Z79899 Other long term (current) drug therapy: Secondary | ICD-10-CM | POA: Diagnosis not present

## 2019-05-22 HISTORY — DX: Cerebral infarction, unspecified: I63.9

## 2019-05-22 LAB — LIPID PANEL
Cholesterol: 174 mg/dL (ref 0–200)
HDL: 47 mg/dL (ref >40)
LDL Cholesterol: 115 mg/dL — ABNORMAL HIGH (ref 0–99)
Total CHOL/HDL Ratio: 3.7 RATIO
Triglycerides: 60 mg/dL (ref <150)
VLDL: 12 mg/dL (ref 0–40)

## 2019-05-22 LAB — ECHOCARDIOGRAM COMPLETE
Height: 70 in
Weight: 2912 oz

## 2019-05-22 MED ORDER — STROKE: EARLY STAGES OF RECOVERY BOOK
Freq: Once | Status: AC
  Administered 2019-05-24: 23:00:00

## 2019-05-22 MED ORDER — PANTOPRAZOLE SODIUM 40 MG IV SOLR
40.0000 mg | INTRAVENOUS | Status: DC
  Administered 2019-05-22 – 2019-05-27 (×6): 40 mg via INTRAVENOUS
  Filled 2019-05-22 (×6): qty 40

## 2019-05-22 MED ORDER — ACETAMINOPHEN 160 MG/5ML PO SOLN
650.0000 mg | ORAL | Status: DC | PRN
  Filled 2019-05-22: qty 20.3

## 2019-05-22 MED ORDER — APIXABAN 2.5 MG PO TABS
2.5000 mg | ORAL_TABLET | Freq: Two times a day (BID) | ORAL | Status: DC
  Filled 2019-05-22 (×3): qty 1

## 2019-05-22 MED ORDER — ASPIRIN EC 325 MG PO TBEC
325.0000 mg | DELAYED_RELEASE_TABLET | Freq: Every day | ORAL | Status: DC
  Administered 2019-05-24 – 2019-05-26 (×3): 325 mg via ORAL
  Filled 2019-05-22 (×4): qty 1

## 2019-05-22 MED ORDER — ENOXAPARIN SODIUM 40 MG/0.4ML ~~LOC~~ SOLN
40.0000 mg | SUBCUTANEOUS | Status: DC
  Administered 2019-05-22: 40 mg via SUBCUTANEOUS
  Filled 2019-05-22: qty 0.4

## 2019-05-22 MED ORDER — ACETAMINOPHEN 650 MG RE SUPP
650.0000 mg | RECTAL | Status: DC | PRN
  Filled 2019-05-22: qty 1

## 2019-05-22 MED ORDER — CLINDAMYCIN PHOSPHATE 600 MG/50ML IV SOLN
600.0000 mg | Freq: Three times a day (TID) | INTRAVENOUS | Status: DC
  Administered 2019-05-22 – 2019-05-27 (×16): 600 mg via INTRAVENOUS
  Filled 2019-05-22 (×19): qty 50

## 2019-05-22 MED ORDER — ACETAMINOPHEN 325 MG PO TABS
650.0000 mg | ORAL_TABLET | ORAL | Status: DC | PRN
  Administered 2019-05-24 – 2019-05-26 (×3): 650 mg via ORAL
  Filled 2019-05-22 (×3): qty 2

## 2019-05-22 MED ORDER — FUROSEMIDE 10 MG/ML IJ SOLN
40.0000 mg | Freq: Once | INTRAMUSCULAR | Status: AC
  Administered 2019-05-23: 40 mg via INTRAVENOUS
  Filled 2019-05-22: qty 4

## 2019-05-22 MED ORDER — ASPIRIN 300 MG RE SUPP
300.0000 mg | Freq: Every day | RECTAL | Status: DC
  Administered 2019-05-22: 300 mg via RECTAL
  Filled 2019-05-22 (×2): qty 1

## 2019-05-22 MED ORDER — ROSUVASTATIN CALCIUM 20 MG PO TABS
40.0000 mg | ORAL_TABLET | Freq: Every day | ORAL | Status: DC
  Administered 2019-05-24 – 2019-05-26 (×3): 40 mg via ORAL
  Filled 2019-05-22 (×4): qty 2

## 2019-05-22 MED ORDER — SODIUM CHLORIDE 0.9 % IV SOLN
INTRAVENOUS | Status: DC
  Administered 2019-05-22 – 2019-05-24 (×2): via INTRAVENOUS

## 2019-05-22 NOTE — ED Notes (Signed)
Pt sleeping, gran-daughter at bedside attentive to pt, no needs at present, will continue to monitor

## 2019-05-22 NOTE — ED Notes (Signed)
Pt's skin warm and dry; declines to be turned at this time; denies need to void at this time

## 2019-05-22 NOTE — ED Notes (Signed)
Provided pt's grand-daughter with warm blankets and a denture cup

## 2019-05-22 NOTE — ED Notes (Signed)
In to introduce myself to pt and granddaughter; pt is sleeping soundly, skin warm and dry, pink; recliner provided for family, appreciative; call bell in reach;

## 2019-05-22 NOTE — H&P (Signed)
[facility] [place] NAME: Jose Castro    MR#:  [id number]  DATE OF BIRTH:  03-Feb-1926  DATE OF ADMISSION:  05/21/2019  PRIMARY CARE PHYSICIAN: Steele Sizer, MD   REQUESTING/REFERRING PHYSICIAN: Delman Kitten, MD  CHIEF COMPLAINT:   Chief Complaint  Patient presents with  . Fall    HISTORY OF PRESENT ILLNESS:  Jose Castro  is a 85 y.o. male with a known history of hypertension, GERD, hyperlipidemia, pacemaker placement.  Patient presented to the emergency room after being found on the floor of his home earlier in the day.  Patient was last seen well at 1 PM on yesterday.  EMS reported left fixed eye deviation to the right.  This has improved slightly by the time I am seeing the patient.  Patient's granddaughter is at the bedside.  Patient is awake, alert, oriented to person, place, and time.  However, he does not recall events surrounding the last 24 hours.  He is somewhat of a poor historian with slight confusion at times.  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 Clayborn Bigness.  Patient's granddaughter at the bedside is aware of his rhythm of atrial flutter.  She reports he has no history of atrial fibrillation or flutter.  Patient denies chest pain, palpitations.  He denies recent illness.  He denies shortness of breath.  He denies fever, chills, nausea, vomiting, diarrhea.  He denies abdominal pain.  Chest x-Jose shows vascular congestion with possible aspiration pneumonia.  Patient was noted to fail his swallowing study at the bedside and therefore has remained n.p.o. planning speech therapy evaluation and recommendations.  BNP is 828 with CK 970.  BUN is 28 with creatinine 1.28.  Patient is noted to have a history of chronic renal failure.  CT brain demonstrates multiple areas of acute nonhemorrhagic infarction in the right hemisphere.  There is asymmetric  hyperattenuation of the right ICA and MCA which could represent large vessel occlusion.  Tele-neurology was consulted in the emergency room noting possible cardioembolic stroke, however, the patient is well past the opportunity to receive TPA.  We have admitted him to the hospital service for further management.  PAST MEDICAL HISTORY:   Past Medical History:  Diagnosis Date  . Arthritis   . BPH (benign prostatic hyperplasia)   . Chronic kidney disease    had a kidney stone which per family was a cyst that was removed  . Dysrhythmia   . GERD (gastroesophageal reflux disease)   . History of kidney stones   . Hyperlipidemia   . Hypertension   . Splenic vein thrombosis   . Thrombosis 12/2015   mural  area and no notation of heart attack    PAST SURGICAL HISTORY:   Past Surgical History:  Procedure Laterality Date  . APPENDECTOMY    . CATARACT EXTRACTION, BILATERAL    . CYSTOSCOPY WITH INSERTION OF UROLIFT    . KIDNEY STONE SURGERY    . KNEE SURGERY    . PACEMAKER INSERTION N/A 07/13/2018   Procedure: INSERTION PACEMAKER-DUEL CHAMBER INITIAL IMPLANT;  Surgeon: Isaias Cowman, MD;  Location: [facility] ORS;  Service: Cardiovascular;  Laterality: N/A;  . SPINE SURGERY      SOCIAL HISTORY:   Social History   Tobacco Use  . Smoking status: Former Smoker    Years: 16.00    Types: Cigarettes    Start date: 10/21/1944    Quit date:  10/21/1960    Years since quitting: 58.6  . Smokeless tobacco: Never Used  Substance Use Topics  . Alcohol use: Yes    Alcohol/week: 3.0 standard drinks    Types: 3 Standard drinks or equivalent per week    Comment: once a week when he goes out to dinner-socially    FAMILY HISTORY:   Family History  Problem Relation Age of Onset  . Cancer Brother        bladder cancer with mets  . Heart disease Mother   . Aortic aneurysm Mother   . Heart attack Maternal Aunt   . Heart attack Maternal Uncle     DRUG ALLERGIES:   Allergies  Allergen  Reactions  . Penicillins Itching and Swelling    Has patient had a PCN reaction causing immediate rash, facial/tongue/throat swelling, SOB or lightheadedness with hypotension: No Has patient had a PCN reaction causing severe rash involving mucus membranes or skin necrosis: No Has patient had a PCN reaction that required hospitalization: No Has patient had a PCN reaction occurring within the last 10 years: No If all of the above answers are "NO", then may proceed with Cephalosporin use.     REVIEW OF SYSTEMS:   Review of Systems  Constitutional: Negative for chills, fever and malaise/fatigue.  HENT: Negative for congestion, sinus pain and sore throat.   Eyes: Negative for blurred vision, double vision and pain.  Respiratory: Negative for cough, shortness of breath and wheezing.   Cardiovascular: Negative for chest pain and palpitations.  Gastrointestinal: Negative for abdominal pain, blood in stool, constipation, diarrhea, heartburn, nausea and vomiting.  Genitourinary: Negative for dysuria and hematuria.  Musculoskeletal: Negative for falls, joint pain and myalgias.  Skin: Negative.  Negative for itching and rash.  Neurological: Positive for focal weakness (Right hand). Negative for dizziness, loss of consciousness and headaches.  Psychiatric/Behavioral: Negative.  Negative for depression.    MEDICATIONS AT HOME:   Prior to Admission medications   Medication Sig Start Date End Date Taking? Authorizing Provider  Cholecalciferol (VITAMIN D3) 2000 units TABS Take 2,000 Units by mouth daily.    Yes [provider]  CIALIS 5 MG tablet Take 5 mg by mouth daily as needed for erectile dysfunction.  06/11/15  Yes [provider]  finasteride (PROSCAR) 5 MG tablet Take 5 mg by mouth daily.  06/11/15  Yes [provider]  lisinopril (PRINIVIL,ZESTRIL) 20 MG tablet Take 1 tablet by mouth daily. 01/28/19  Yes [provider]  Omega-3 Fatty Acids (FISH OIL) 1000  MG CAPS Take 1,000 mg by mouth daily.    Yes [provider]  pantoprazole (PROTONIX) 40 MG tablet TAKE ONE TABLET EVERY DAY 02/15/19  Yes Sowles, Drue Stager, MD      VITAL SIGNS:  Blood pressure (!) 152/71, pulse (!) 59, temperature 97.9 F (36.6 C), temperature source Oral, resp. rate (!) 25, height 5\' 10"  (1.778 m), weight 82.6 kg, SpO2 100 %.  PHYSICAL EXAMINATION:  Physical Exam Constitutional:      General: He is not in acute distress. HENT:     Head: Normocephalic.     Right Ear: External ear normal.     Left Ear: External ear normal.     Nose: Nose normal.     Mouth/Throat:     Mouth: Mucous membranes are moist.     Pharynx: Oropharynx is clear.  Eyes:     General: No scleral icterus.    Conjunctiva/sclera: Conjunctivae normal.     Comments: Right  pupil 78mm reactive Left pupil 8mm reactive  Right eye deviation  Neck:     Musculoskeletal: Normal range of motion and neck supple.  Cardiovascular:     Rate and Rhythm: Normal rate. Rhythm irregular.     Pulses: Normal pulses.     Heart sounds: Normal heart sounds. No murmur. No friction rub. No gallop.   Pulmonary:     Effort: Pulmonary effort is normal.     Breath sounds: Normal breath sounds. No wheezing, rhonchi or rales.  Abdominal:     General: Bowel sounds are normal. There is no distension.     Palpations: Abdomen is soft.     Tenderness: There is no abdominal tenderness. There is no right CVA tenderness or left CVA tenderness.  Musculoskeletal:        General: No swelling or tenderness.     Right lower leg: No edema.     Left lower leg: No edema.     Comments: Left hand grasp 4/5 Right hand grasp 5/5   Skin:    General: Skin is warm and dry.     Capillary Refill: Capillary refill takes less than 2 seconds.     Coloration: Skin is not jaundiced.     Findings: No lesion or rash.  Neurological:     Mental Status: He is alert and oriented to person, place, and time.     Sensory: No sensory deficit.      Motor: Weakness present.     Coordination: Coordination abnormal.  Psychiatric:        Behavior: Behavior normal.       LABORATORY PANEL:   CBC Recent Labs  Lab 05/21/19 1735  WBC 25.4*  HGB 18.6*  HCT 53.5*  PLT 309   ------------------------------------------------------------------------------------------------------------------  Chemistries  Recent Labs  Lab 05/21/19 1735  NA 137  K 3.6  CL 101  CO2 20*  GLUCOSE 170*  BUN 28*  CREATININE 1.28*  CALCIUM 8.9  AST 45*  ALT 20  ALKPHOS 78  BILITOT 1.5*   ------------------------------------------------------------------------------------------------------------------  Cardiac Enzymes No results for input(s): TROPONINI in the last 168 hours. ------------------------------------------------------------------------------------------------------------------  RADIOLOGY:  Dg Chest Portable 1 View  Result Date: 05/21/2019 CLINICAL DATA:  Unwitnessed fall. EXAM: PORTABLE CHEST 1 VIEW COMPARISON:  07/17/2018 FINDINGS: Stable dual lead cardiac pacemaker. Cardiomediastinal silhouette is normal. Mediastinal contours appear intact. Calcific atherosclerotic disease of the aorta. There is no evidence of focal airspace consolidation, pleural effusion or pneumothorax. Mildly increased interstitial markings. Osseous structures are without acute abnormality. Soft tissues are grossly normal. IMPRESSION: Mildly increased interstitial markings which may be seen with pulmonary vascular congestion. Electronically Signed   By: Fidela Salisbury M.D.   On: 05/21/2019 18:26   Ct Head Code Stroke Wo Contrast  Result Date: 05/21/2019 CLINICAL DATA:  Code stroke. Unwitnessed fall at home. Last seen normal 7 p.m. 05/20/2019. Clinically, RIGHT gaze preference with LEFT hemiparesis. EXAM: CT HEAD WITHOUT CONTRAST TECHNIQUE: Contiguous axial images were obtained from the base of the skull through the vertex without intravenous contrast.  COMPARISON:  None. FINDINGS: The patient was unable to remain motionless for the exam. Small or subtle lesions could be overlooked. Assessment of ASPECTS score is limited. Brain: There are multiple areas of hypoattenuation, in the RIGHT hemisphere, consistent with acute infarction. These involve the RIGHT basal ganglia and internal capsule, insula, anterior and posterior temporal lobe, without visible hemorrhage. Elsewhere there is generalized atrophy with chronic microvascular ischemic change. BILATERAL extra-axial CSF collections likely represent atrophy rather  than hygromas. Vascular: Calcification of the cavernous internal carotid arteries consistent with cerebrovascular atherosclerotic disease. Asymmetric hyperattenuation of the RIGHT ICA and MCA could represent a large vessel occlusion, but assessment is limited due to motion. Skull: Intact Sinuses/Orbits: No acute finding. Other: None. ASPECTS [facility] Stroke Program Early CT Score) - Ganglionic level infarction (caudate, lentiform nuclei, internal capsule, insula, M1-M3 cortex): 3 - Supraganglionic infarction (M4-M6 cortex): 3 Total score (0-10 with 10 being normal): 6 IMPRESSION: 1. Motion degraded exam demonstrating multiple areas of acute nonhemorrhagic infarction in the RIGHT hemisphere. 2. ASPECTS is 6, could be less.  Assessment is limited. 3. Asymmetric hyperattenuation of the RIGHT ICA and MCA could represent large vessel occlusion. These results were called by telephone at the time of interpretation on 05/21/2019 at 6:10 pm to Dr. Delman Kitten , who verbally acknowledged these results. * Electronically Signed   By: Staci Righter M.D.   On: 05/21/2019 18:15      IMPRESSION AND PLAN:   1.  Embolic stroke - Continue neurologic checks per stroke protocol - DVT prophylaxis initiated -Aspirin therapy initiated - Normal saline infusing at 50 cc/h - It is recommended patient receive antiplatelet therapy.  However patient is currently n.p.o.  awaiting speech therapy evaluation for swallow. - Neurology has been consulted for follow-up care as well. - No MRI as patient has pacemaker - Echocardiogram - Carotid ultrasound in the a.m.  2. atrial fibrillation/flutter - Lovenox subcu - Telemetry monitoring - Echocardiogram -Cardiology consulted for further evaluation and recommendations  3. aspiration pneumonia - Treatment with clindamycin initiated -We will repeat chest x-Jose for follow-up  4.  Hypertension - We will treat persistent hypertension expectantly  5.  CKD - BUN is 28 with creatinine 1.289- we will repeat BMP in the a.m. and continue to monitor renal function closely  DVT and PPI prophylaxis initiated      All the records are reviewed and case discussed with ED provider. The plan of care was discussed in details with the patient (and family). I answered all questions. The patient agreed to proceed with the above mentioned plan. Further management will depend upon hospital course.   CODE STATUS: Full code  TOTAL TIME TAKING CARE OF THIS PATIENT: 45 minutes.    Boyd on 05/22/2019 at 12:47 AM  Pager - [phone]  After 6pm go to [email or web address] - [employer] [facility]  Office  [phone]  CC: Primary care physician; Steele Sizer, MD   Note: This dictation was prepared with Dragon dictation along with smaller phrase technology. Any transcriptional errors that result from this process are unintentional.

## 2019-05-22 NOTE — ED Notes (Signed)
Pt's granddaughter asking to speak with [facility] prior to pt going to floor as she is Therapist, sports and feels that pt needs someone to stay with him for his safety.  Spoke with [facility] who is currently unavailable, but will check in when she is free.  Family aware of same.  Will hold pt for now.

## 2019-05-22 NOTE — ED Notes (Signed)
ED TO INPATIENT HANDOFF REPORT  ED Nurse Name and Phone #: Weston Anna [phone] Name/Age/Gender Jose Castro 85 y.o. male Room/Bed: ED36A/ED36A  Code Status   Code Status: Full Code  Home/SNF/Other Rehab probably. Pt is from home.  Patient oriented to: self, place, time and situation Is this baseline? Yes   Triage Complete: Triage complete  Chief Complaint poss tia  Triage Note Pt arrives via EMS after having an unwitnessed fall at home- LKW was 1:00 yesterday evening- pt having some right sided gaze shift- pt not able to move eyes to the left- pt desatted and placed on a nonrebreather per EMS- pt satting 95% on nonrebreather- pt alert and oriented   Allergies Allergies  Allergen Reactions  . Penicillins Itching and Swelling    Has patient had a PCN reaction causing immediate rash, facial/tongue/throat swelling, SOB or lightheadedness with hypotension: No Has patient had a PCN reaction causing severe rash involving mucus membranes or skin necrosis: No Has patient had a PCN reaction that required hospitalization: No Has patient had a PCN reaction occurring within the last 10 years: No If all of the above answers are "NO", then may proceed with Cephalosporin use.     Level of Care/Admitting Diagnosis ED Disposition    ED Disposition Condition [facility] Area: [place] [[id number]]  Level of Care: Med-Surg [16]  Covid Evaluation: Confirmed COVID Negative  Diagnosis: Stroke [facility]) [[id number]]  Admitting Physician: Mayer Camel [[id number]]  Attending Physician: Mayer Camel [[id number]]  Estimated length of stay: 3 - 4 days  Certification:: I certify this patient will need inpatient services for at least 2 midnights  PT Class (Do Not Modify): Inpatient [101]  PT Acc Code (Do Not Modify): Private [1]       B Medical/Surgery History Past Medical History:  Diagnosis Date  . Arthritis   . BPH (benign prostatic hyperplasia)   . Chronic  kidney disease    had a kidney stone which per family was a cyst that was removed  . Dysrhythmia   . GERD (gastroesophageal reflux disease)   . History of kidney stones   . Hyperlipidemia   . Hypertension   . Splenic vein thrombosis   . Stroke ([place]) 05/22/2019  . Thrombosis 12/2015   mural  area and no notation of heart attack   Past Surgical History:  Procedure Laterality Date  . APPENDECTOMY    . CATARACT EXTRACTION, BILATERAL    . CYSTOSCOPY WITH INSERTION OF UROLIFT    . KIDNEY STONE SURGERY    . KNEE SURGERY    . PACEMAKER INSERTION N/A 07/13/2018   Procedure: INSERTION PACEMAKER-DUEL CHAMBER INITIAL IMPLANT;  Surgeon: Isaias Cowman, MD;  Location: [facility] ORS;  Service: Cardiovascular;  Laterality: N/A;  . SPINE SURGERY       A IV Location/Drains/Wounds Patient Lines/Drains/Airways Status   Active Line/Drains/Airways    Name:   Placement date:   Placement time:   Site:   Days:   Peripheral IV 05/21/19 Left Antecubital   05/21/19    1725    Antecubital   1   Peripheral IV 05/21/19 Right Antecubital   05/21/19    2300    Antecubital   1   Incision (Closed) 07/13/18 Chest   07/13/18    1237     313          Intake/Output Last 24 hours  Intake/Output Summary (Last 24 hours) at 05/22/2019 1339 Last data filed at  05/22/2019 0820 Gross per 24 hour  Intake 50 ml  Output 50 ml  Net 0 ml    Labs/Imaging Results for orders placed or performed during the hospital encounter of 05/21/19 (from the past 48 hour(s))  Protime-INR     Status: None   Collection Time: 05/21/19  5:35 PM  Result Value Ref Range   Prothrombin Time 13.8 11.4 - 15.2 seconds   INR 1.1 0.8 - 1.2    Comment: (NOTE) INR goal varies based on device and disease states. Performed at [facility], [address] [place], [place] [address]   APTT     Status: None   Collection Time: 05/21/19  5:35 PM  Result Value Ref Range   aPTT 25 24 - 36 seconds    Comment: Performed at [facility], [address] [place], [place] [address]  CBC     Status: Abnormal   Collection Time: 05/21/19  5:35 PM  Result Value Ref Range   WBC 25.4 (H) 4.0 - 10.5 K/uL   RBC 6.11 (H) 4.22 - 5.81 MIL/uL   Hemoglobin 18.6 (H) 13.0 - 17.0 g/dL   HCT 53.5 (H) 39.0 - 52.0 %   MCV 87.6 80.0 - 100.0 fL   MCH 30.4 26.0 - 34.0 pg   MCHC 34.8 30.0 - 36.0 g/dL   RDW 13.6 11.5 - 15.5 %   Platelets 309 150 - 400 K/uL   nRBC 0.0 0.0 - 0.2 %    Comment: Performed at [facility], [address] [place], [place] [address]  Differential     Status: Abnormal   Collection Time: 05/21/19  5:35 PM  Result Value Ref Range   Neutrophils Relative % 87 %   Neutro Abs 21.9 (H) 1.7 - 7.7 K/uL   Lymphocytes Relative 3 %   Lymphs Abs 0.9 0.7 - 4.0 K/uL   Monocytes Relative 9 %   Monocytes Absolute 2.2 (H) 0.1 - 1.0 K/uL   Eosinophils Relative 0 %   Eosinophils Absolute 0.0 0.0 - 0.5 K/uL   Basophils Relative 0 %   Basophils Absolute 0.1 0.0 - 0.1 K/uL   WBC Morphology VACUOLATED NEUTROPHILS    RBC Morphology MORPHOLOGY UNREMARKABLE    Smear Review Normal platelet morphology    Immature Granulocytes 1 %   Abs Immature Granulocytes 0.33 (H) 0.00 - 0.07 K/uL    Comment: Performed at [facility], [address] [place], [place] [address]  Comprehensive metabolic panel     Status: Abnormal   Collection Time: 05/21/19  5:35 PM  Result Value Ref Range   Sodium 137 135 - 145 mmol/L   Potassium 3.6 3.5 - 5.1 mmol/L   Chloride 101 98 - 111 mmol/L   CO2 20 (L) 22 - 32 mmol/L   Glucose, Bld 170 (H) 70 - 99 mg/dL   BUN 28 (H) 8 - 23 mg/dL   Creatinine, Ser 1.28 (H) 0.61 - 1.24 mg/dL   Calcium 8.9 8.9 - 10.3 mg/dL   Total Protein 7.2 6.5 - 8.1 g/dL   Albumin 4.0 3.5 - 5.0 g/dL   AST 45 (H) 15 - 41 U/L   ALT 20 0 - 44 U/L   Alkaline Phosphatase 78 38 - 126 U/L   Total Bilirubin 1.5 (H) 0.3 - 1.2 mg/dL   GFR calc non Af Amer 49 (L) >60 mL/min   GFR calc Af Amer 57 (L) >60 mL/min    Anion gap 16 (H) 5 -  15    Comment: Performed at [facility], [address] [place], [place] [address]  CK     Status: Abnormal   Collection Time: 05/21/19  5:35 PM  Result Value Ref Range   Total CK 970 (H) 49 - 397 U/L    Comment: Performed at [facility], [address] [place], [place] [address]  Brain natriuretic peptide     Status: Abnormal   Collection Time: 05/21/19  5:35 PM  Result Value Ref Range   B Natriuretic Peptide 828.0 (H) 0.0 - 100.0 pg/mL    Comment: Performed at [facility], [address], [place], [place] [address]  Urine Drug Screen, Qualitative     Status: None   Collection Time: 05/21/19  7:26 PM  Result Value Ref Range   Tricyclic, Ur Screen NONE DETECTED NONE DETECTED   Amphetamines, Ur Screen NONE DETECTED NONE DETECTED   MDMA (Ecstasy)Ur Screen NONE DETECTED NONE DETECTED   Cocaine Metabolite,Ur [place] NONE DETECTED NONE DETECTED   Opiate, Ur Screen NONE DETECTED NONE DETECTED   Phencyclidine (PCP) Ur S NONE DETECTED NONE DETECTED   Cannabinoid 50 Ng, Ur [place] NONE DETECTED NONE DETECTED   Barbiturates, Ur Screen NONE DETECTED NONE DETECTED   Benzodiazepine, Ur Scrn NONE DETECTED NONE DETECTED   Methadone Scn, Ur NONE DETECTED NONE DETECTED    Comment: (NOTE) Tricyclics + metabolites, urine    Cutoff 1000 ng/mL Amphetamines + metabolites, urine  Cutoff 1000 ng/mL MDMA (Ecstasy), urine              Cutoff 500 ng/mL Cocaine Metabolite, urine          Cutoff 300 ng/mL Opiate + metabolites, urine        Cutoff 300 ng/mL Phencyclidine (PCP), urine         Cutoff 25 ng/mL Cannabinoid, urine                 Cutoff 50 ng/mL Barbiturates + metabolites, urine  Cutoff 200 ng/mL Benzodiazepine, urine              Cutoff 200 ng/mL Methadone, urine                   Cutoff 300 ng/mL The urine drug screen provides only a preliminary, unconfirmed analytical test result and should not be used for non-medical purposes. Clinical  consideration and professional judgment should be applied to any positive drug screen result due to possible interfering substances. A more specific alternate chemical method must be used in order to obtain a confirmed analytical result. Gas chromatography / mass spectrometry (GC/MS) is the preferred confirmat ory method. Performed at [facility], [address] [place], [place] [address]   Urinalysis, Routine w reflex microscopic     Status: Abnormal   Collection Time: 05/21/19  7:26 PM  Result Value Ref Range   Color, Urine YELLOW (A) YELLOW   APPearance HAZY (A) CLEAR   Specific Gravity, Urine 1.023 1.005 - 1.030   pH 5.0 5.0 - 8.0   Glucose, UA 50 (A) NEGATIVE mg/dL   Hgb urine dipstick MODERATE (A) NEGATIVE   Bilirubin Urine NEGATIVE NEGATIVE   Ketones, ur 5 (A) NEGATIVE mg/dL   Protein, ur 100 (A) NEGATIVE mg/dL   Nitrite NEGATIVE NEGATIVE   Leukocytes,Ua NEGATIVE NEGATIVE   RBC / HPF 0-5 0 - 5 RBC/hpf   WBC, UA 6-10 0 - 5 WBC/hpf   Bacteria, UA RARE (A) NONE SEEN   Squamous  Epithelial / LPF 0-5 0 - 5   Mucus PRESENT    Hyaline Casts, UA PRESENT    Granular Casts, UA PRESENT    Cellular Cast, UA PRESENT     Comment: Performed at [facility], [address] [place], [place] [address]  SARS Coronavirus 2 (CEPHEID - Performed in [place] hospital lab), Hosp Order     Status: None   Collection Time: 05/21/19  7:26 PM   Specimen: Nasopharyngeal Swab  Result Value Ref Range   SARS Coronavirus 2 NEGATIVE NEGATIVE    Comment: (NOTE) If result is NEGATIVE SARS-CoV-2 target nucleic acids are NOT DETECTED. The SARS-CoV-2 RNA is generally detectable in upper and lower  respiratory specimens during the acute phase of infection. The lowest  concentration of SARS-CoV-2 viral copies this assay can detect is 250  copies / mL. A negative result does not preclude SARS-CoV-2 infection  and should not be used as the sole basis for treatment or other  patient  management decisions.  A negative result may occur with  improper specimen collection / handling, submission of specimen other  than nasopharyngeal swab, presence of viral mutation(s) within the  areas targeted by this assay, and inadequate number of viral copies  (<250 copies / mL). A negative result must be combined with clinical  observations, patient history, and epidemiological information. If result is POSITIVE SARS-CoV-2 target nucleic acids are DETECTED. The SARS-CoV-2 RNA is generally detectable in upper and lower  respiratory specimens dur ing the acute phase of infection.  Positive  results are indicative of active infection with SARS-CoV-2.  Clinical  correlation with patient history and other diagnostic information is  necessary to determine patient infection status.  Positive results do  not rule out bacterial infection or co-infection with other viruses. If result is PRESUMPTIVE POSTIVE SARS-CoV-2 nucleic acids MAY BE PRESENT.   A presumptive positive result was obtained on the submitted specimen  and confirmed on repeat testing.  While 2019 novel coronavirus  (SARS-CoV-2) nucleic acids may be present in the submitted sample  additional confirmatory testing may be necessary for epidemiological  and / or clinical management purposes  to differentiate between  SARS-CoV-2 and other Sarbecovirus currently known to infect humans.  If clinically indicated additional testing with an alternate test  methodology [phone]) is advised. The SARS-CoV-2 RNA is generally  detectable in upper and lower respiratory sp ecimens during the acute  phase of infection. The expected result is Negative. Fact Sheet for Patients:  StrictlyIdeas.no Fact Sheet for Healthcare Providers: BankingDealers.co.za This test is not yet approved or cleared by the [place] FDA and has been authorized for detection and/or diagnosis of SARS-CoV-2 by FDA under  an Emergency Use Authorization (EUA).  This EUA will remain in effect (meaning this test can be used) for the duration of the COVID-19 declaration under Section 564(b)(1) of the Act, 21 U.S.C. section 360bbb-3(b)(1), unless the authorization is terminated or revoked sooner. Performed at [facility], [address] [place], [place] [address]   Blood Culture (routine x 2)     Status: None (Preliminary result)   Collection Time: 05/21/19  7:26 PM   Specimen: BLOOD  Result Value Ref Range   Specimen Description BLOOD RIGHT HAND    Special Requests      BOTTLES DRAWN AEROBIC AND ANAEROBIC Blood Culture adequate volume   Culture      NO GROWTH < 12 HOURS Performed at [facility], [place], [place]  [address]    Report Status PENDING   Blood Culture (routine x 2)     Status: None (Preliminary result)   Collection Time: 05/21/19  7:50 PM   Specimen: BLOOD  Result Value Ref Range   Specimen Description BLOOD RIGHT ANTECUBITAL    Special Requests      BOTTLES DRAWN AEROBIC AND ANAEROBIC Blood Culture results may not be optimal due to an excessive volume of blood received in culture bottles   Culture      NO GROWTH < 12 HOURS Performed at [facility], [address] [place], [place] [address]    Report Status PENDING   Glucose, capillary     Status: Abnormal   Collection Time: 05/21/19  9:01 PM  Result Value Ref Range   Glucose-Capillary 166 (H) 70 - 99 mg/dL  Lipid panel     Status: Abnormal   Collection Time: 05/22/19  7:56 AM  Result Value Ref Range   Cholesterol 174 0 - 200 mg/dL   Triglycerides 60 <150 mg/dL   HDL 47 >40 mg/dL   Total CHOL/HDL Ratio 3.7 RATIO   VLDL 12 0 - 40 mg/dL   LDL Cholesterol 115 (H) 0 - 99 mg/dL    Comment:        Total Cholesterol/HDL:CHD Risk Coronary Heart Disease Risk Table                     Men   Women  1/2 Average Risk   3.4   3.3  Average Risk       5.0   4.4  2 X Average Risk   9.6   7.1   3 X Average Risk  23.4   11.0        Use the calculated Patient Ratio above and the CHD Risk Table to determine the patient's CHD Risk.        ATP III CLASSIFICATION (LDL):  <100     mg/dL   Optimal  100-129  mg/dL   Near or Above                    Optimal  130-159  mg/dL   Borderline  160-189  mg/dL   High  >190     mg/dL   Very High Performed at [facility], [address], [place], [place] [address]    Dg Chest Portable 1 View  Result Date: 05/21/2019 CLINICAL DATA:  Unwitnessed fall. EXAM: PORTABLE CHEST 1 VIEW COMPARISON:  07/17/2018 FINDINGS: Stable dual lead cardiac pacemaker. Cardiomediastinal silhouette is normal. Mediastinal contours appear intact. Calcific atherosclerotic disease of the aorta. There is no evidence of focal airspace consolidation, pleural effusion or pneumothorax. Mildly increased interstitial markings. Osseous structures are without acute abnormality. Soft tissues are grossly normal. IMPRESSION: Mildly increased interstitial markings which may be seen with pulmonary vascular congestion. Electronically Signed   By: Fidela Salisbury M.D.   On: 05/21/2019 18:26   Ct Head Code Stroke Wo Contrast  Result Date: 05/21/2019 CLINICAL DATA:  Code stroke. Unwitnessed fall at home. Last seen normal 7 p.m. 05/20/2019. Clinically, RIGHT gaze preference with LEFT hemiparesis. EXAM: CT HEAD WITHOUT CONTRAST TECHNIQUE: Contiguous axial images were obtained from the base of the skull through the vertex without intravenous contrast. COMPARISON:  None. FINDINGS: The patient was unable to remain motionless for the exam. Small or subtle lesions could be overlooked. Assessment of ASPECTS score is limited. Brain: There are multiple areas of hypoattenuation, in the  RIGHT hemisphere, consistent with acute infarction. These involve the RIGHT basal ganglia and internal capsule, insula, anterior and posterior temporal lobe, without visible hemorrhage. Elsewhere there is  generalized atrophy with chronic microvascular ischemic change. BILATERAL extra-axial CSF collections likely represent atrophy rather than hygromas. Vascular: Calcification of the cavernous internal carotid arteries consistent with cerebrovascular atherosclerotic disease. Asymmetric hyperattenuation of the RIGHT ICA and MCA could represent a large vessel occlusion, but assessment is limited due to motion. Skull: Intact Sinuses/Orbits: No acute finding. Other: None. ASPECTS [facility] Stroke Program Early CT Score) - Ganglionic level infarction (caudate, lentiform nuclei, internal capsule, insula, M1-M3 cortex): 3 - Supraganglionic infarction (M4-M6 cortex): 3 Total score (0-10 with 10 being normal): 6 IMPRESSION: 1. Motion degraded exam demonstrating multiple areas of acute nonhemorrhagic infarction in the RIGHT hemisphere. 2. ASPECTS is 6, could be less.  Assessment is limited. 3. Asymmetric hyperattenuation of the RIGHT ICA and MCA could represent large vessel occlusion. These results were called by telephone at the time of interpretation on 05/21/2019 at 6:10 pm to Dr. Delman Kitten , who verbally acknowledged these results. * Electronically Signed   By: Staci Righter M.D.   On: 05/21/2019 18:15    Pending Labs Unresulted Labs (From admission, onward)    Start     Ordered   05/29/19 0500  Creatinine, serum  (enoxaparin (LOVENOX)    CrCl >/= 30 ml/min)  Weekly,   STAT    Comments: while on enoxaparin therapy    05/22/19 0231   05/22/19 0500  Hemoglobin A1c  Tomorrow morning,   STAT     05/22/19 0231   05/21/19 1755  Ethanol  Once,   R     05/21/19 1755          Vitals/Pain Today's Vitals   05/22/19 0700 05/22/19 0730 05/22/19 0800 05/22/19 0830  BP: 121/63 133/76 123/75 123/62  Pulse: (!) 59 60 60 60  Resp: (!) 26 16 17 19   Temp:      TempSrc:      SpO2: 99% 100% 100% 100%  Weight:      Height:      PainSc:        Isolation Precautions No active isolations  Medications Medications    stroke: mapping our early stages of recovery book (has no administration in time range)  0.9 %  sodium chloride infusion ( Intravenous New Bag/Given 05/22/19 0451)  acetaminophen (TYLENOL) tablet 650 mg (has no administration in time range)    Or  acetaminophen (TYLENOL) solution 650 mg (has no administration in time range)    Or  acetaminophen (TYLENOL) suppository 650 mg (has no administration in time range)  enoxaparin (LOVENOX) injection 40 mg (40 mg Subcutaneous Given 05/22/19 0818)  aspirin suppository 300 mg (300 mg Rectal Given 05/22/19 1132)    Or  aspirin EC tablet 325 mg ( Oral See Alternative 05/22/19 1132)  clindamycin (CLEOCIN) IVPB 600 mg (0 mg Intravenous Stopped 05/22/19 0820)  pantoprazole (PROTONIX) injection 40 mg (40 mg Intravenous Given 05/22/19 0816)  levofloxacin (LEVAQUIN) IVPB 750 mg (0 mg Intravenous Stopped 05/21/19 2209)  aspirin suppository 300 mg (300 mg Rectal Given 05/21/19 1938)    Mobility walks with person assist- was very weak when arrived to ED. Walked with assistance to toilet overnight.  High fall risk   Focused Assessments    R Recommendations: See Admitting Provider Note  Report given to:   Additional Notes:

## 2019-05-22 NOTE — ED Notes (Signed)
Cardiology at bedside.

## 2019-05-22 NOTE — ED Notes (Signed)
Pt suctioned; speech noted to be much more clearer this am; says he's feeling better; denies pain;

## 2019-05-22 NOTE — ED Notes (Signed)
US at bedside

## 2019-05-22 NOTE — Progress Notes (Signed)
[facility] [place] NAME: Jose Castro    MR#:  [id number]  DATE OF BIRTH:  Jul 16, 1926  SUBJECTIVE:  CHIEF COMPLAINT:   Chief Complaint  Patient presents with  . Fall  sleepy, persistent lt weakness, grand daughter at bedside REVIEW OF SYSTEMS:  Review of Systems  Constitutional: Negative for diaphoresis, fever, malaise/fatigue and weight loss.  HENT: Negative for ear discharge, ear pain, hearing loss, nosebleeds, sore throat and tinnitus.   Eyes: Negative for blurred vision and pain.  Respiratory: Negative for cough, hemoptysis, shortness of breath and wheezing.   Cardiovascular: Negative for chest pain, palpitations, orthopnea and leg swelling.  Gastrointestinal: Negative for abdominal pain, blood in stool, constipation, diarrhea, heartburn, nausea and vomiting.  Genitourinary: Negative for dysuria, frequency and urgency.  Musculoskeletal: Negative for back pain and myalgias.  Skin: Negative for itching and rash.  Neurological: Positive for focal weakness (lt > rt). Negative for dizziness, tingling, tremors, seizures, weakness and headaches.  Psychiatric/Behavioral: Negative for depression. The patient is not nervous/anxious.   :   DRUG ALLERGIES:   Allergies  Allergen Reactions  . Penicillins Itching and Swelling    Has patient had a PCN reaction causing immediate rash, facial/tongue/throat swelling, SOB or lightheadedness with hypotension: No Has patient had a PCN reaction causing severe rash involving mucus membranes or skin necrosis: No Has patient had a PCN reaction that required hospitalization: No Has patient had a PCN reaction occurring within the last 10 years: No If all of the above answers are "NO", then may proceed with Cephalosporin use.    VITALS:  Blood pressure 131/77, pulse 60, temperature 97.9 F (36.6 C), temperature source Oral, resp. rate 16, height 5\' 10"  (1.778 m), weight 82.6 kg, SpO2 100 %. PHYSICAL  EXAMINATION:  Physical Exam HENT:     Head: Normocephalic and atraumatic.  Eyes:     Conjunctiva/sclera: Conjunctivae normal.     Pupils: Pupils are equal, round, and reactive to light.  Neck:     Musculoskeletal: Normal range of motion and neck supple.     Thyroid: No thyromegaly.     Trachea: No tracheal deviation.  Cardiovascular:     Rate and Rhythm: Normal rate and regular rhythm.     Heart sounds: Normal heart sounds.  Pulmonary:     Effort: Pulmonary effort is normal. No respiratory distress.     Breath sounds: Normal breath sounds. No wheezing.  Chest:     Chest wall: No tenderness.  Abdominal:     General: Bowel sounds are normal. There is no distension.     Palpations: Abdomen is soft.     Tenderness: There is no abdominal tenderness.  Musculoskeletal: Normal range of motion.  Skin:    General: Skin is warm and dry.     Findings: No rash.  Neurological:     Mental Status: He is oriented to person, place, and time. He is lethargic.     Cranial Nerves: No cranial nerve deficit.     Motor: Weakness (lt > rt) present.    LABORATORY PANEL:  Male CBC Recent Labs  Lab 05/21/19 1735  WBC 25.4*  HGB 18.6*  HCT 53.5*  PLT 309   ------------------------------------------------------------------------------------------------------------------ Chemistries  Recent Labs  Lab 05/21/19 1735  NA 137  K 3.6  CL 101  CO2 20*  GLUCOSE 170*  BUN 28*  CREATININE 1.28*  CALCIUM 8.9  AST 45*  ALT 20  ALKPHOS 78  BILITOT 1.5*  RADIOLOGY:  Dg Chest Portable 1 View  Result Date: 05/21/2019 CLINICAL DATA:  Unwitnessed fall. EXAM: PORTABLE CHEST 1 VIEW COMPARISON:  07/17/2018 FINDINGS: Stable dual lead cardiac pacemaker. Cardiomediastinal silhouette is normal. Mediastinal contours appear intact. Calcific atherosclerotic disease of the aorta. There is no evidence of focal airspace consolidation, pleural effusion or pneumothorax. Mildly increased interstitial markings.  Osseous structures are without acute abnormality. Soft tissues are grossly normal. IMPRESSION: Mildly increased interstitial markings which may be seen with pulmonary vascular congestion. Electronically Signed   By: Fidela Salisbury M.D.   On: 05/21/2019 18:26   Ct Head Code Stroke Wo Contrast  Result Date: 05/21/2019 CLINICAL DATA:  Code stroke. Unwitnessed fall at home. Last seen normal 7 p.m. 05/20/2019. Clinically, RIGHT gaze preference with LEFT hemiparesis. EXAM: CT HEAD WITHOUT CONTRAST TECHNIQUE: Contiguous axial images were obtained from the base of the skull through the vertex without intravenous contrast. COMPARISON:  None. FINDINGS: The patient was unable to remain motionless for the exam. Small or subtle lesions could be overlooked. Assessment of ASPECTS score is limited. Brain: There are multiple areas of hypoattenuation, in the RIGHT hemisphere, consistent with acute infarction. These involve the RIGHT basal ganglia and internal capsule, insula, anterior and posterior temporal lobe, without visible hemorrhage. Elsewhere there is generalized atrophy with chronic microvascular ischemic change. BILATERAL extra-axial CSF collections likely represent atrophy rather than hygromas. Vascular: Calcification of the cavernous internal carotid arteries consistent with cerebrovascular atherosclerotic disease. Asymmetric hyperattenuation of the RIGHT ICA and MCA could represent a large vessel occlusion, but assessment is limited due to motion. Skull: Intact Sinuses/Orbits: No acute finding. Other: None. ASPECTS [facility] Stroke Program Early CT Score) - Ganglionic level infarction (caudate, lentiform nuclei, internal capsule, insula, M1-M3 cortex): 3 - Supraganglionic infarction (M4-M6 cortex): 3 Total score (0-10 with 10 being normal): 6 IMPRESSION: 1. Motion degraded exam demonstrating multiple areas of acute nonhemorrhagic infarction in the RIGHT hemisphere. 2. ASPECTS is 6, could be less.  Assessment is  limited. 3. Asymmetric hyperattenuation of the RIGHT ICA and MCA could represent large vessel occlusion. These results were called by telephone at the time of interpretation on 05/21/2019 at 6:10 pm to Dr. Delman Kitten , who verbally acknowledged these results. * Electronically Signed   By: Staci Righter M.D.   On: 05/21/2019 18:15   ASSESSMENT AND PLAN:  85 y.o.malewith a known history of hypertension, GERD, hyperlipidemia, cataract surgery on both eyes, sick sinus syndrome s/p pacemaker placement admitted with a left sided weakness    1. Acute cardio-Embolic stroke -  head CT showing hyperdense MCA and acute infarction in right MCA territory most likely due to LVO occlusion. - not TPA candidate per Neuro - Continue neurologic checks per stroke protocol -Aspirin therapy initiated for secondary prevention - Failed bedside swallow eval - will need formal ST eval - Neurology recommends MRI/MRA but doubt this can be done as he has pacemaker - Echocardiogram & Carotid ultrasound  -HgbA1c, fasting lipid panel - PT consult, OT consult, Speech consult - Cardiology recommends Eliquis - I've started  2. atrial fibrillation/flutter - given Lovenox subcu once - switch to eliquis 2.5 bid - Echocardiogram  3. aspiration pneumonia - empiric clindamycin initiated -We will repeat chest x-ray for follow-up  4.  Hypertension - permissive hypertension in acute CVA  5.  CKD - BUN is 28 with creatinine 1.289- we will repeat BMP in the a.m. and continue to monitor renal function closely      All the records are reviewed and  case discussed with Care Management/Social Worker. Management plans discussed with the patient, family (grand daughter at bedside and daughter over phone) and they are in agreement.  CODE STATUS: Full Code  TOTAL TIME TAKING CARE OF THIS PATIENT: 35 minutes.   More than 50% of the time was spent in counseling/coordination of care: YES  POSSIBLE D/C IN 1-2 DAYS, DEPENDING  ON CLINICAL CONDITION.   Max Sane M.D on 05/22/2019 at 3:44 PM  Between 7am to 6pm - Pager - [phone]  After 6pm go to [email or web address] - Technical brewer [facility]  Office  [phone]  CC: Primary care physician; Steele Sizer, MD  Note: This dictation was prepared with Dragon dictation along with smaller phrase technology. Any transcriptional errors that result from this process are unintentional.

## 2019-05-22 NOTE — ED Notes (Addendum)
Answered call bell; granddaughter says pt has been coughing and can't get the mucus out; suctioned with yanker; pt says feels better; denies need to void at this time

## 2019-05-22 NOTE — Consult Note (Signed)
Chief Complaint: left sided weaknexs HPI:   85 y.o. male with a known history of hypertension, GERD, hyperlipidemia, cataract surgery on both eyes, pacemaker placement for what seems to be sick sinus syndrome admitted with a left sided weakness. Patient presented to the emergency room after being found on the floor of his home earlier in the day.  Patient was last seen well at 1 PM on 06/19. Last time daughter spoke with him was 7 pm on 06/19. Patient granddaughter checked on him and noticed aright side gaze deviation with decrease sensation on the left side, EMS called and patient brought top ER. Patient found to be on A flutter. Head ct with hyperdense right MCA/questionable Right ICA and right MCA territory acute infarction. Patient seen by tele neurology with initial NIHS of 6 and deemed not candidate for neither tpa nor IR due out of window. Patient started on clindamycine due suspicion of aspiration pneumonia.   Patient denies chest pain, palpitations.  He denies recent illness.  He denies shortness of breath.  He denies fever, chills, nausea, vomiting, diarrhea.  He denies abdominal pain.  Chest x-ray shows vascular congestion with possible aspiration pneumonia.  Patient was noted to fail his swallowing study at the bedside and therefore has remained n.p.o. planning speech therapy evaluation and recommendations.  BNP is 828 with CK 970.  BUN is 28 with creatinine 1.28.  Patient is noted to have a history of chronic renal failure.        Past Medical History:  Diagnosis Date  . Arthritis   . BPH (benign prostatic hyperplasia)   . Chronic kidney disease    had a kidney stone which per family was a cyst that was removed  . Dysrhythmia   . GERD (gastroesophageal reflux disease)   . History of kidney stones   . Hyperlipidemia   . Hypertension   . Splenic vein thrombosis   . Stroke ([place]) 05/22/2019  . Thrombosis 12/2015   mural  area and no notation of heart attack    Past  Surgical History:  Procedure Laterality Date  . APPENDECTOMY    . CATARACT EXTRACTION, BILATERAL    . CYSTOSCOPY WITH INSERTION OF UROLIFT    . KIDNEY STONE SURGERY    . KNEE SURGERY    . PACEMAKER INSERTION N/A 07/13/2018   Procedure: INSERTION PACEMAKER-DUEL CHAMBER INITIAL IMPLANT;  Surgeon: Isaias Cowman, MD;  Location: [facility] ORS;  Service: Cardiovascular;  Laterality: N/A;  . SPINE SURGERY      Family History  Problem Relation Age of Onset  . Cancer Brother        bladder cancer with mets  . Heart disease Mother   . Aortic aneurysm Mother   . Heart attack Maternal Aunt   . Heart attack Maternal Uncle    Social History:  reports that he quit smoking about 58 years ago. His smoking use included cigarettes. He started smoking about 74 years ago. He quit after 16.00 years of use. He has never used smokeless tobacco. He reports current alcohol use of about 3.0 standard drinks of alcohol per week. He reports that he does not use drugs.  Allergies:  Allergies  Allergen Reactions  . Penicillins Itching and Swelling    Has patient had a PCN reaction causing immediate rash, facial/tongue/throat swelling, SOB or lightheadedness with hypotension: No Has patient had a PCN reaction causing severe rash involving mucus membranes or skin necrosis: No Has patient had a PCN reaction that required hospitalization:  No Has patient had a PCN reaction occurring within the last 10 years: No If all of the above answers are "NO", then may proceed with Cephalosporin use.     (Not in a hospital admission)   ROS: As per HPI  Physical Examination: Blood pressure 123/62, pulse 60, temperature 97.9 F (36.6 C), temperature source Oral, resp. rate 19, height 5\' 10"  (1.778 m), weight 82.6 kg, SpO2 100 %.  HEENT-  Normocephalic, no lesions, without obvious abnormality.  Normal external eye and conjunctiva.  Normal TM's bilaterally.  Normal auditory canals and external ears. Normal external nose,  mucus membranes and septum.  Normal pharynx. Neck supple with no masses, nodes, nodules or enlargement. Cardiovascular - regular rate and rhythm Lungs - chest clear, no wheezing, rales, normal symmetric air entry, Heart exam - S1, S2 normal, no murmur, no gallop, rate regular Abdomen - soft, non-tender; bowel sounds normal; no masses,  no organomegaly Extremities - no edema  Neurologic Examination: Patient is alert, awake, oriented x3,  Dysarthric, follows commands. NO neglect noted CN: PERRLA, right gaze deviation crossing midline, questionable right hemianopsia, left facial, face sensation to touch is nle, palate and tongue midilne Motor:  Drift on the LUEX/LLEX otherwise strenght id 4/5 IN LUEX/LLEX 5/5 on RUEX/RLEX No sensory deficit appreciated Dysmetria on FN on the left and knee to heel on the left DTR not checked Gait not checked   Results for orders placed or performed during the hospital encounter of 05/21/19 (from the past 48 hour(s))  Protime-INR     Status: None   Collection Time: 05/21/19  5:35 PM  Result Value Ref Range   Prothrombin Time 13.8 11.4 - 15.2 seconds   INR 1.1 0.8 - 1.2    Comment: (NOTE) INR goal varies based on device and disease states. Performed at [facility], [address] [place], [place] [address]   APTT     Status: None   Collection Time: 05/21/19  5:35 PM  Result Value Ref Range   aPTT 25 24 - 36 seconds    Comment: Performed at [facility], [address] [place], [place] [address]  CBC     Status: Abnormal   Collection Time: 05/21/19  5:35 PM  Result Value Ref Range   WBC 25.4 (H) 4.0 - 10.5 K/uL   RBC 6.11 (H) 4.22 - 5.81 MIL/uL   Hemoglobin 18.6 (H) 13.0 - 17.0 g/dL   HCT 53.5 (H) 39.0 - 52.0 %   MCV 87.6 80.0 - 100.0 fL   MCH 30.4 26.0 - 34.0 pg   MCHC 34.8 30.0 - 36.0 g/dL   RDW 13.6 11.5 - 15.5 %   Platelets 309 150 - 400 K/uL   nRBC 0.0 0.0 - 0.2 %    Comment: Performed at [facility],  [address] [place], [place] [address]  Differential     Status: Abnormal   Collection Time: 05/21/19  5:35 PM  Result Value Ref Range   Neutrophils Relative % 87 %   Neutro Abs 21.9 (H) 1.7 - 7.7 K/uL   Lymphocytes Relative 3 %   Lymphs Abs 0.9 0.7 - 4.0 K/uL   Monocytes Relative 9 %   Monocytes Absolute 2.2 (H) 0.1 - 1.0 K/uL   Eosinophils Relative 0 %   Eosinophils Absolute 0.0 0.0 - 0.5 K/uL   Basophils Relative 0 %   Basophils Absolute 0.1 0.0 - 0.1 K/uL   WBC Morphology VACUOLATED NEUTROPHILS    RBC Morphology  MORPHOLOGY UNREMARKABLE    Smear Review Normal platelet morphology    Immature Granulocytes 1 %   Abs Immature Granulocytes 0.33 (H) 0.00 - 0.07 K/uL    Comment: Performed at [facility], [address] [place], [place] [address]  Comprehensive metabolic panel     Status: Abnormal   Collection Time: 05/21/19  5:35 PM  Result Value Ref Range   Sodium 137 135 - 145 mmol/L   Potassium 3.6 3.5 - 5.1 mmol/L   Chloride 101 98 - 111 mmol/L   CO2 20 (L) 22 - 32 mmol/L   Glucose, Bld 170 (H) 70 - 99 mg/dL   BUN 28 (H) 8 - 23 mg/dL   Creatinine, Ser 1.28 (H) 0.61 - 1.24 mg/dL   Calcium 8.9 8.9 - 10.3 mg/dL   Total Protein 7.2 6.5 - 8.1 g/dL   Albumin 4.0 3.5 - 5.0 g/dL   AST 45 (H) 15 - 41 U/L   ALT 20 0 - 44 U/L   Alkaline Phosphatase 78 38 - 126 U/L   Total Bilirubin 1.5 (H) 0.3 - 1.2 mg/dL   GFR calc non Af Amer 49 (L) >60 mL/min   GFR calc Af Amer 57 (L) >60 mL/min   Anion gap 16 (H) 5 - 15    Comment: Performed at [facility], [address] [place], [place] [address]  CK     Status: Abnormal   Collection Time: 05/21/19  5:35 PM  Result Value Ref Range   Total CK 970 (H) 49 - 397 U/L    Comment: Performed at [facility], [address] [place], [place] [address]  Brain natriuretic peptide     Status: Abnormal   Collection Time: 05/21/19  5:35 PM  Result Value Ref Range   B Natriuretic Peptide 828.0 (H) 0.0 - 100.0 pg/mL     Comment: Performed at [facility], [address] [place],  [address]  Urine Drug Screen, Qualitative     Status: None   Collection Time: 05/21/19  7:26 PM  Result Value Ref Range   Tricyclic, Ur Screen NONE DETECTED NONE DETECTED   Amphetamines, Ur Screen NONE DETECTED NONE DETECTED   MDMA (Ecstasy)Ur Screen NONE DETECTED NONE DETECTED   Cocaine Metabolite,Ur [place] NONE DETECTED NONE DETECTED   Opiate, Ur Screen NONE DETECTED NONE DETECTED   Phencyclidine (PCP) Ur S NONE DETECTED NONE DETECTED   Cannabinoid 50 Ng, Ur  NONE DETECTED NONE DETECTED   Barbiturates, Ur Screen NONE DETECTED NONE DETECTED   Benzodiazepine, Ur Scrn NONE DETECTED NONE DETECTED   Methadone Scn, Ur NONE DETECTED NONE DETECTED    Comment: (NOTE) Tricyclics + metabolites, urine    Cutoff 1000 ng/mL Amphetamines + metabolites, urine  Cutoff 1000 ng/mL MDMA (Ecstasy), urine              Cutoff 500 ng/mL Cocaine Metabolite, urine          Cutoff 300 ng/mL Opiate + metabolites, urine        Cutoff 300 ng/mL Phencyclidine (PCP), urine         Cutoff 25 ng/mL Cannabinoid, urine                 Cutoff 50 ng/mL Barbiturates + metabolites, urine  Cutoff 200 ng/mL Benzodiazepine, urine              Cutoff 200 ng/mL Methadone, urine  Cutoff 300 ng/mL The urine drug screen provides only a preliminary, unconfirmed analytical test result and should not be used for non-medical purposes. Clinical consideration and professional judgment should be applied to any positive drug screen result due to possible interfering substances. A more specific alternate chemical method must be used in order to obtain a confirmed analytical result. Gas chromatography / mass spectrometry (GC/MS) is the preferred confirmat ory method. Performed at [facility], [address] [place], [place] [address]   Urinalysis, Routine w reflex microscopic     Status: Abnormal   Collection Time: 05/21/19   7:26 PM  Result Value Ref Range   Color, Urine YELLOW (A) YELLOW   APPearance HAZY (A) CLEAR   Specific Gravity, Urine 1.023 1.005 - 1.030   pH 5.0 5.0 - 8.0   Glucose, UA 50 (A) NEGATIVE mg/dL   Hgb urine dipstick MODERATE (A) NEGATIVE   Bilirubin Urine NEGATIVE NEGATIVE   Ketones, ur 5 (A) NEGATIVE mg/dL   Protein, ur 100 (A) NEGATIVE mg/dL   Nitrite NEGATIVE NEGATIVE   Leukocytes,Ua NEGATIVE NEGATIVE   RBC / HPF 0-5 0 - 5 RBC/hpf   WBC, UA 6-10 0 - 5 WBC/hpf   Bacteria, UA RARE (A) NONE SEEN   Squamous Epithelial / LPF 0-5 0 - 5   Mucus PRESENT    Hyaline Casts, UA PRESENT    Granular Casts, UA PRESENT    Cellular Cast, UA PRESENT     Comment: Performed at [facility], [address] [place], [place] [address]  SARS Coronavirus 2 (CEPHEID - Performed in [place] hospital lab), Hosp Order     Status: None   Collection Time: 05/21/19  7:26 PM   Specimen: Nasopharyngeal Swab  Result Value Ref Range   SARS Coronavirus 2 NEGATIVE NEGATIVE    Comment: (NOTE) If result is NEGATIVE SARS-CoV-2 target nucleic acids are NOT DETECTED. The SARS-CoV-2 RNA is generally detectable in upper and lower  respiratory specimens during the acute phase of infection. The lowest  concentration of SARS-CoV-2 viral copies this assay can detect is 250  copies / mL. A negative result does not preclude SARS-CoV-2 infection  and should not be used as the sole basis for treatment or other  patient management decisions.  A negative result may occur with  improper specimen collection / handling, submission of specimen other  than nasopharyngeal swab, presence of viral mutation(s) within the  areas targeted by this assay, and inadequate number of viral copies  (<250 copies / mL). A negative result must be combined with clinical  observations, patient history, and epidemiological information. If result is POSITIVE SARS-CoV-2 target nucleic acids are DETECTED. The SARS-CoV-2 RNA is generally  detectable in upper and lower  respiratory specimens dur ing the acute phase of infection.  Positive  results are indicative of active infection with SARS-CoV-2.  Clinical  correlation with patient history and other diagnostic information is  necessary to determine patient infection status.  Positive results do  not rule out bacterial infection or co-infection with other viruses. If result is PRESUMPTIVE POSTIVE SARS-CoV-2 nucleic acids MAY BE PRESENT.   A presumptive positive result was obtained on the submitted specimen  and confirmed on repeat testing.  While 2019 novel coronavirus  (SARS-CoV-2) nucleic acids may be present in the submitted sample  additional confirmatory testing may be necessary for epidemiological  and / or clinical management purposes  to differentiate between  SARS-CoV-2 and other Sarbecovirus currently known to infect humans.  If clinically indicated additional testing with an alternate test  methodology [phone]) is advised. The SARS-CoV-2 RNA is generally  detectable in upper and lower respiratory sp ecimens during the acute  phase of infection. The expected result is Negative. Fact Sheet for Patients:  StrictlyIdeas.no Fact Sheet for Healthcare Providers: BankingDealers.co.za This test is not yet approved or cleared by the [place] FDA and has been authorized for detection and/or diagnosis of SARS-CoV-2 by FDA under an Emergency Use Authorization (EUA).  This EUA will remain in effect (meaning this test can be used) for the duration of the COVID-19 declaration under Section 564(b)(1) of the Act, 21 U.S.C. section 360bbb-3(b)(1), unless the authorization is terminated or revoked sooner. Performed at [facility], [address], [place], [place] [address]   Blood Culture (routine x 2)     Status: None (Preliminary result)   Collection Time: 05/21/19  7:26 PM   Specimen: BLOOD  Result Value Ref  Range   Specimen Description BLOOD RIGHT HAND    Special Requests      BOTTLES DRAWN AEROBIC AND ANAEROBIC Blood Culture adequate volume   Culture      NO GROWTH < 12 HOURS Performed at [facility], [address], [place], [place] [address]    Report Status PENDING   Blood Culture (routine x 2)     Status: None (Preliminary result)   Collection Time: 05/21/19  7:50 PM   Specimen: BLOOD  Result Value Ref Range   Specimen Description BLOOD RIGHT ANTECUBITAL    Special Requests      BOTTLES DRAWN AEROBIC AND ANAEROBIC Blood Culture results may not be optimal due to an excessive volume of blood received in culture bottles   Culture      NO GROWTH < 12 HOURS Performed at [facility], [address] [place], [place] [address]    Report Status PENDING   Glucose, capillary     Status: Abnormal   Collection Time: 05/21/19  9:01 PM  Result Value Ref Range   Glucose-Capillary 166 (H) 70 - 99 mg/dL  Lipid panel     Status: Abnormal   Collection Time: 05/22/19  7:56 AM  Result Value Ref Range   Cholesterol 174 0 - 200 mg/dL   Triglycerides 60 <150 mg/dL   HDL 47 >40 mg/dL   Total CHOL/HDL Ratio 3.7 RATIO   VLDL 12 0 - 40 mg/dL   LDL Cholesterol 115 (H) 0 - 99 mg/dL    Comment:        Total Cholesterol/HDL:CHD Risk Coronary Heart Disease Risk Table                     Men   Women  1/2 Average Risk   3.4   3.3  Average Risk       5.0   4.4  2 X Average Risk   9.6   7.1  3 X Average Risk  23.4   11.0        Use the calculated Patient Ratio above and the CHD Risk Table to determine the patient's CHD Risk.        ATP III CLASSIFICATION (LDL):  <100     mg/dL   Optimal  100-129  mg/dL   Near or Above                    Optimal  130-159  mg/dL   Borderline  160-189  mg/dL   High  >  190     mg/dL   Very High Performed at [facility], [address] [place], [place] [address]    Dg Chest Portable 1 View  Result Date: 05/21/2019 CLINICAL DATA:   Unwitnessed fall. EXAM: PORTABLE CHEST 1 VIEW COMPARISON:  07/17/2018 FINDINGS: Stable dual lead cardiac pacemaker. Cardiomediastinal silhouette is normal. Mediastinal contours appear intact. Calcific atherosclerotic disease of the aorta. There is no evidence of focal airspace consolidation, pleural effusion or pneumothorax. Mildly increased interstitial markings. Osseous structures are without acute abnormality. Soft tissues are grossly normal. IMPRESSION: Mildly increased interstitial markings which may be seen with pulmonary vascular congestion. Electronically Signed   By: Fidela Salisbury M.D.   On: 05/21/2019 18:26   Ct Head Code Stroke Wo Contrast  Result Date: 05/21/2019 CLINICAL DATA:  Code stroke. Unwitnessed fall at home. Last seen normal 7 p.m. 05/20/2019. Clinically, RIGHT gaze preference with LEFT hemiparesis. EXAM: CT HEAD WITHOUT CONTRAST TECHNIQUE: Contiguous axial images were obtained from the base of the skull through the vertex without intravenous contrast. COMPARISON:  None. FINDINGS: The patient was unable to remain motionless for the exam. Small or subtle lesions could be overlooked. Assessment of ASPECTS score is limited. Brain: There are multiple areas of hypoattenuation, in the RIGHT hemisphere, consistent with acute infarction. These involve the RIGHT basal ganglia and internal capsule, insula, anterior and posterior temporal lobe, without visible hemorrhage. Elsewhere there is generalized atrophy with chronic microvascular ischemic change. BILATERAL extra-axial CSF collections likely represent atrophy rather than hygromas. Vascular: Calcification of the cavernous internal carotid arteries consistent with cerebrovascular atherosclerotic disease. Asymmetric hyperattenuation of the RIGHT ICA and MCA could represent a large vessel occlusion, but assessment is limited due to motion. Skull: Intact Sinuses/Orbits: No acute finding. Other: None. ASPECTS [facility] Stroke Program Early CT  Score) - Ganglionic level infarction (caudate, lentiform nuclei, internal capsule, insula, M1-M3 cortex): 3 - Supraganglionic infarction (M4-M6 cortex): 3 Total score (0-10 with 10 being normal): 6 IMPRESSION: 1. Motion degraded exam demonstrating multiple areas of acute nonhemorrhagic infarction in the RIGHT hemisphere. 2. ASPECTS is 6, could be less.  Assessment is limited. 3. Asymmetric hyperattenuation of the RIGHT ICA and MCA could represent large vessel occlusion. These results were called by telephone at the time of interpretation on 05/21/2019 at 6:10 pm to Dr. Delman Kitten , who verbally acknowledged these results. * Electronically Signed   By: Staci Righter M.D.   On: 05/21/2019 18:15    Assessment:   85 y.o. male with a known history of hypertension, GERD, hyperlipidemia, cataract surgery on both eyes, pacemaker placement for what seems to be sick sinus syndrome admitted with a left sided weakness in whom head ct showing hyperdense MCA and acute infarction oo right MCA territory most likely due to LVO occlusion.  Plan: Neuroprotectives measures including normothermia, normoglycemia, correct eletrolytes/metabolic abnlities, treat infection. 1. HgbA1c, fasting lipid panel 2. MRI, MRA  of the brain without contrast as long as pacemaker MRI compatible to gauge extent of stroke and indication of anticoagulation. if not,  CTA of brain if renal function permits 3. PT consult, OT consult, Speech consult 4. Echocardiogram 5. Carotid dopplers 6. Prophylactic therapy: start ASA and with new onset of a flutter patient may need anti coagulation. Cardiology consult recommended 7. Risk factor modification 8. Telemetry monitoring 10- Remaining of treatment by primary team.    05/22/2019, 8:58 AM

## 2019-05-22 NOTE — ED Notes (Signed)
Pt cleansed of urine and stool. Fresh brief placed.

## 2019-05-22 NOTE — Consult Note (Signed)
[facility] Cardiology  CARDIOLOGY CONSULT NOTE  Patient ID: Jose Castro MRN: [id number] DOB/AGE: 02-22-1926 85 y.o.  Admit date: 05/21/2019 Referring Physician Manuella Ghazi Primary Physician [facility] Primary Cardiologist [facility] Reason for Consultation atrial fibrillation  HPI: 85 year old gentleman referred for evaluation of atrial fibrillation.  Brought to [facility] ED after being found at home lying on the floor.  CT revealed multiple areas of acute nonhemorrhagic infarction right hemisphere.  X-ray revealed vascular congestion and possible aspiration pneumonia, patient treated with antibiotics.  White count 25,400.  CT reveals ventricular pacing with underlying rhythm of atrial flutter.  Labs notable for borderline elevated troponin of 0.05, 0.05 in the absence of chest pain.  The patient is status post dual-chamber pacemaker for sick sinus syndrome 07/13/2018.  Review of systems complete and found to be negative unless listed above     Past Medical History:  Diagnosis Date  . Arthritis   . BPH (benign prostatic hyperplasia)   . Chronic kidney disease    had a kidney stone which per family was a cyst that was removed  . Dysrhythmia   . GERD (gastroesophageal reflux disease)   . History of kidney stones   . Hyperlipidemia   . Hypertension   . Splenic vein thrombosis   . Stroke ([place]) 05/22/2019  . Thrombosis 12/2015   mural  area and no notation of heart attack    Past Surgical History:  Procedure Laterality Date  . APPENDECTOMY    . CATARACT EXTRACTION, BILATERAL    . CYSTOSCOPY WITH INSERTION OF UROLIFT    . KIDNEY STONE SURGERY    . KNEE SURGERY    . PACEMAKER INSERTION N/A 07/13/2018   Procedure: INSERTION PACEMAKER-DUEL CHAMBER INITIAL IMPLANT;  Surgeon: Isaias Cowman, MD;  Location: [facility] ORS;  Service: Cardiovascular;  Laterality: N/A;  . SPINE SURGERY      (Not in a hospital admission)  Social History   Socioeconomic History  . Marital status: Widowed    Spouse name: Not on  file  . Number of children: 6  . Years of education: Not on file  . Highest education level: Some college, no degree  Occupational History  . Occupation: retired    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  Social Needs  . Financial resource strain: Not hard at all  . Food insecurity    Worry: Never true    Inability: Never true  . Transportation needs    Medical: No    Non-medical: No  Tobacco Use  . Smoking status: Former Smoker    Years: 16.00    Types: Cigarettes    Start date: 10/21/1944    Quit date: 10/21/1960    Years since quitting: 58.6  . Smokeless tobacco: Never Used  Substance and Sexual Activity  . Alcohol use: Yes    Alcohol/week: 3.0 standard drinks    Types: 3 Standard drinks or equivalent per week    Comment: once a week when he goes out to dinner-socially  . Drug use: No  . Sexual activity: Yes    Partners: Female  Lifestyle  . Physical activity    Days per week: 0 days    Minutes per session: 0 min  . Stress: Not at all  Relationships  . Social connections    Talks on phone: More than three times a week    Gets together: Twice a week    Attends religious service: More than 4 times per year    Active member of  club or organization: Yes    Attends meetings of clubs or organizations: More than 4 times per year    Relationship status: Widowed  . Intimate partner violence    Fear of current or ex partner: No    Emotionally abused: No    Physically abused: No    Forced sexual activity: No  Other Topics Concern  . Not on file  Social History Narrative   Widow, has a good networks of friends and family     Family History  Problem Relation Age of Onset  . Cancer Brother        bladder cancer with mets  . Heart disease Mother   . Aortic aneurysm Mother   . Heart attack Maternal Aunt   . Heart attack Maternal Uncle       Review of systems complete and found to be negative unless listed above      PHYSICAL EXAM  General: Well  developed, well nourished, in no acute distress HEENT:  Normocephalic and atramatic Neck:  No JVD.  Lungs: Clear bilaterally to auscultation and percussion. Heart: HRRR . Normal S1 and S2 without gallops or murmurs.  Abdomen: Bowel sounds are positive, abdomen soft and non-tender  Msk:  Back normal, normal gait. Normal strength and tone for age. Extremities: No clubbing, cyanosis or edema.   Neuro: Alert and oriented X 3. Psych:  Good affect, responds appropriately  Labs:   Lab Results  Component Value Date   WBC 25.4 (H) 05/21/2019   HGB 18.6 (H) 05/21/2019   HCT 53.5 (H) 05/21/2019   MCV 87.6 05/21/2019   PLT 309 05/21/2019    Recent Labs  Lab 05/21/19 1735  NA 137  K 3.6  CL 101  CO2 20*  BUN 28*  CREATININE 1.28*  CALCIUM 8.9  PROT 7.2  BILITOT 1.5*  ALKPHOS 78  ALT 20  AST 45*  GLUCOSE 170*   Lab Results  Component Value Date   CKTOTAL 970 (H) 05/21/2019   TROPONINI 0.05 (HH) 07/17/2018    Lab Results  Component Value Date   CHOL 174 05/22/2019   CHOL 186 06/15/2018   CHOL 221 (H) 07/07/2017   Lab Results  Component Value Date   HDL 47 05/22/2019   HDL 40 (L) 06/15/2018   HDL 37 (L) 07/07/2017   Lab Results  Component Value Date   LDLCALC 115 (H) 05/22/2019   LDLCALC 120 (H) 06/15/2018   LDLCALC 152 (H) 07/07/2017   Lab Results  Component Value Date   TRIG 60 05/22/2019   TRIG 149 06/15/2018   TRIG 160 (H) 07/07/2017   Lab Results  Component Value Date   CHOLHDL 3.7 05/22/2019   CHOLHDL 4.7 06/15/2018   CHOLHDL 6.0 (H) 07/07/2017   No results found for: LDLDIRECT    Radiology: Dg Chest Portable 1 View  Result Date: 05/21/2019 CLINICAL DATA:  Unwitnessed fall. EXAM: PORTABLE CHEST 1 VIEW COMPARISON:  07/17/2018 FINDINGS: Stable dual lead cardiac pacemaker. Cardiomediastinal silhouette is normal. Mediastinal contours appear intact. Calcific atherosclerotic disease of the aorta. There is no evidence of focal airspace consolidation,  pleural effusion or pneumothorax. Mildly increased interstitial markings. Osseous structures are without acute abnormality. Soft tissues are grossly normal. IMPRESSION: Mildly increased interstitial markings which may be seen with pulmonary vascular congestion. Electronically Signed   By: Fidela Salisbury M.D.   On: 05/21/2019 18:26   Ct Head Code Stroke Wo Contrast  Result Date: 05/21/2019 CLINICAL DATA:  Code stroke. Unwitnessed fall at  home. Last seen normal 7 p.m. 05/20/2019. Clinically, RIGHT gaze preference with LEFT hemiparesis. EXAM: CT HEAD WITHOUT CONTRAST TECHNIQUE: Contiguous axial images were obtained from the base of the skull through the vertex without intravenous contrast. COMPARISON:  None. FINDINGS: The patient was unable to remain motionless for the exam. Small or subtle lesions could be overlooked. Assessment of ASPECTS score is limited. Brain: There are multiple areas of hypoattenuation, in the RIGHT hemisphere, consistent with acute infarction. These involve the RIGHT basal ganglia and internal capsule, insula, anterior and posterior temporal lobe, without visible hemorrhage. Elsewhere there is generalized atrophy with chronic microvascular ischemic change. BILATERAL extra-axial CSF collections likely represent atrophy rather than hygromas. Vascular: Calcification of the cavernous internal carotid arteries consistent with cerebrovascular atherosclerotic disease. Asymmetric hyperattenuation of the RIGHT ICA and MCA could represent a large vessel occlusion, but assessment is limited due to motion. Skull: Intact Sinuses/Orbits: No acute finding. Other: None. ASPECTS [facility] Stroke Program Early CT Score) - Ganglionic level infarction (caudate, lentiform nuclei, internal capsule, insula, M1-M3 cortex): 3 - Supraganglionic infarction (M4-M6 cortex): 3 Total score (0-10 with 10 being normal): 6 IMPRESSION: 1. Motion degraded exam demonstrating multiple areas of acute nonhemorrhagic infarction  in the RIGHT hemisphere. 2. ASPECTS is 6, could be less.  Assessment is limited. 3. Asymmetric hyperattenuation of the RIGHT ICA and MCA could represent large vessel occlusion. These results were called by telephone at the time of interpretation on 05/21/2019 at 6:10 pm to Dr. Delman Kitten , who verbally acknowledged these results. * Electronically Signed   By: Staci Righter M.D.   On: 05/21/2019 18:15    EKG: Ventricular pacing with underlying atrial flutter  ASSESSMENT AND PLAN:   1.  Persistent atrial flutter, rate controlled, with ventricular pacing.  Patient has chads vas score of 5, and is candidate for chronic anticoagulation. 2.  Acute stroke, possible embolic, awaiting MRA of brain 3.  Aspiration pneumonia 4.  Borderline elevated troponin, without rise and fall, likely demand supply ischemia in the setting of acute stroke  Recommendations  1.  Agree with overall current therapy 2.  Review 2D echocardiogram 3.  Consider starting Eliquis 2.5 mg twice daily ( patient over the age of 21, and Cr transiently greater than 1.5 )  Signed: Isaias Cowman MD,PhD, [facility] 05/22/2019, 1:45 PM

## 2019-05-22 NOTE — Progress Notes (Signed)
PT Cancellation Note  Patient Details Name: Jose Castro MRN: [id number] DOB: [birth year]   Cancelled Treatment:    Reason Eval/Treat Not Completed: Other (comment)(PT spoke with RN, requesting PT to hold pt for now, report has been called and pt will be transferring to floor shortly. PT will re-attempt as able.)   Lieutenant Diego PT, DPT 3:13 PM,05/22/19 [phone]

## 2019-05-23 ENCOUNTER — Inpatient Hospital Stay: Payer: Medicare Other

## 2019-05-23 DIAGNOSIS — I639 Cerebral infarction, unspecified: Secondary | ICD-10-CM

## 2019-05-23 LAB — CBC WITH DIFFERENTIAL/PLATELET
Abs Immature Granulocytes: 0.08 10*3/uL — ABNORMAL HIGH (ref 0.00–0.07)
Basophils Absolute: 0 10*3/uL (ref 0.0–0.1)
Basophils Relative: 0 %
Eosinophils Absolute: 0.1 10*3/uL (ref 0.0–0.5)
Eosinophils Relative: 1 %
HCT: 49.6 % (ref 39.0–52.0)
Hemoglobin: 16.6 g/dL (ref 13.0–17.0)
Immature Granulocytes: 0 %
Lymphocytes Relative: 9 %
Lymphs Abs: 1.7 10*3/uL (ref 0.7–4.0)
MCH: 29.7 pg (ref 26.0–34.0)
MCHC: 33.5 g/dL (ref 30.0–36.0)
MCV: 88.7 fL (ref 80.0–100.0)
Monocytes Absolute: 1.8 10*3/uL — ABNORMAL HIGH (ref 0.1–1.0)
Monocytes Relative: 10 %
Neutro Abs: 15.3 10*3/uL — ABNORMAL HIGH (ref 1.7–7.7)
Neutrophils Relative %: 80 %
Platelets: 270 10*3/uL (ref 150–400)
RBC: 5.59 MIL/uL (ref 4.22–5.81)
RDW: 14.5 % (ref 11.5–15.5)
WBC: 19 10*3/uL — ABNORMAL HIGH (ref 4.0–10.5)
nRBC: 0 % (ref 0.0–0.2)

## 2019-05-23 LAB — BLOOD GAS, ARTERIAL
Acid-base deficit: 0.2 mmol/L (ref 0.0–2.0)
Bicarbonate: 22.6 mmol/L (ref 20.0–28.0)
FIO2: 0.44
O2 Saturation: 91.1 %
Patient temperature: 37
pCO2 arterial: 31 mmHg — ABNORMAL LOW (ref 32.0–48.0)
pH, Arterial: 7.47 — ABNORMAL HIGH (ref 7.350–7.450)
pO2, Arterial: 57 mmHg — ABNORMAL LOW (ref 83.0–108.0)

## 2019-05-23 LAB — COMPREHENSIVE METABOLIC PANEL
ALT: 17 U/L (ref 0–44)
AST: 20 U/L (ref 15–41)
Albumin: 3.6 g/dL (ref 3.5–5.0)
Alkaline Phosphatase: 62 U/L (ref 38–126)
Anion gap: 12 (ref 5–15)
BUN: 42 mg/dL — ABNORMAL HIGH (ref 8–23)
CO2: 26 mmol/L (ref 22–32)
Calcium: 8.5 mg/dL — ABNORMAL LOW (ref 8.9–10.3)
Chloride: 103 mmol/L (ref 98–111)
Creatinine, Ser: 1.35 mg/dL — ABNORMAL HIGH (ref 0.61–1.24)
GFR calc Af Amer: 53 mL/min — ABNORMAL LOW (ref >60)
GFR calc non Af Amer: 46 mL/min — ABNORMAL LOW (ref >60)
Glucose, Bld: 121 mg/dL — ABNORMAL HIGH (ref 70–99)
Potassium: 3.5 mmol/L (ref 3.5–5.1)
Sodium: 141 mmol/L (ref 135–145)
Total Bilirubin: 2.1 mg/dL — ABNORMAL HIGH (ref 0.3–1.2)
Total Protein: 7.1 g/dL (ref 6.5–8.1)

## 2019-05-23 MED ORDER — FUROSEMIDE 10 MG/ML IJ SOLN
40.0000 mg | Freq: Every day | INTRAMUSCULAR | Status: DC
  Administered 2019-05-23 – 2019-05-25 (×3): 40 mg via INTRAVENOUS
  Filled 2019-05-23 (×3): qty 4

## 2019-05-23 NOTE — Progress Notes (Signed)
PT Cancellation Note  Patient Details Name: Jose Castro MRN: [id number] DOB: [birth year]   Cancelled Treatment:    Reason Eval/Treat Not Completed: Other (comment).  PT consult received.  Chart reviewed.  Per OT "Spoke with RN who requests therapy hold at this time due to pt's lethargy and oxygen requirements."  Will re-attempt PT evaluation at a later date/time as medically appropriate.  Leitha Bleak, PT 05/23/19, 12:03 PM [phone]

## 2019-05-23 NOTE — Progress Notes (Signed)
STAT CT head reviewed and shows acute right MCA infarct with progressive cytotoxic edema in the right anterior and posterior temporal lobe and in the posterior limb internal capsule on the right.  Noted mild midline shift to the left due to edema.  CT findings discussed with on-call neurologist Dr. Irish Elders.  On repeat exam, patient is awake and alert.  Noted with significant dysarthria but able to follow commands without difficulty.  Nods appropriately to orientation questions.  Detailed neuro exam as previously documented on progress notes.  Possibly need transfer to stepdown unit for close monitoring of neuro status.

## 2019-05-23 NOTE — Progress Notes (Addendum)
Subjective: L side weakness is persistent and periods of confusion.      Past Medical History:  Diagnosis Date  . Arthritis   . BPH (benign prostatic hyperplasia)   . Chronic kidney disease    had a kidney stone which per family was a cyst that was removed  . Dysrhythmia   . GERD (gastroesophageal reflux disease)   . History of kidney stones   . Hyperlipidemia   . Hypertension   . Splenic vein thrombosis   . Stroke ([facility]) 05/22/2019  . Thrombosis 12/2015   mural  area and no notation of heart attack    Past Surgical History:  Procedure Laterality Date  . APPENDECTOMY    . CATARACT EXTRACTION, BILATERAL    . CYSTOSCOPY WITH INSERTION OF UROLIFT    . KIDNEY STONE SURGERY    . KNEE SURGERY    . PACEMAKER INSERTION N/A 07/13/2018   Procedure: INSERTION PACEMAKER-DUEL CHAMBER INITIAL IMPLANT;  Surgeon: Isaias Cowman, MD;  Location: [facility] ORS;  Service: Cardiovascular;  Laterality: N/A;  . SPINE SURGERY      Family History  Problem Relation Age of Onset  . Cancer Brother        bladder cancer with mets  . Heart disease Mother   . Aortic aneurysm Mother   . Heart attack Maternal Aunt   . Heart attack Maternal Uncle    Social History:  reports that he quit smoking about 58 years ago. His smoking use included cigarettes. He started smoking about 74 years ago. He quit after 16.00 years of use. He has never used smokeless tobacco. He reports current alcohol use of about 3.0 standard drinks of alcohol per week. He reports that he does not use drugs.  Allergies:  Allergies  Allergen Reactions  . Penicillins Itching and Swelling    Has patient had a PCN reaction causing immediate rash, facial/tongue/throat swelling, SOB or lightheadedness with hypotension: No Has patient had a PCN reaction causing severe rash involving mucus membranes or skin necrosis: No Has patient had a PCN reaction that required hospitalization: No Has patient had a PCN reaction occurring within the  last 10 years: No If all of the above answers are "NO", then may proceed with Cephalosporin use.     Medications Prior to Admission  Medication Sig Dispense Refill  . Cholecalciferol (VITAMIN D3) 2000 units TABS Take 2,000 Units by mouth daily.     Marland Kitchen CIALIS 5 MG tablet Take 5 mg by mouth daily as needed for erectile dysfunction.     . finasteride (PROSCAR) 5 MG tablet Take 5 mg by mouth daily.     Marland Kitchen lisinopril (PRINIVIL,ZESTRIL) 20 MG tablet Take 1 tablet by mouth daily.    . Omega-3 Fatty Acids (FISH OIL) 1000 MG CAPS Take 1,000 mg by mouth daily.     . pantoprazole (PROTONIX) 40 MG tablet TAKE ONE TABLET EVERY DAY 90 tablet 1    ROS: As per HPI  Physical Examination: Blood pressure (!) 144/64, pulse 60, temperature 97.9 F (36.6 C), temperature source Oral, resp. rate (!) 22, height 5\' 10"  (1.778 m), weight 82.6 kg, SpO2 94 %.  HEENT-  Normocephalic, no lesions, without obvious abnormality.  Normal external eye and conjunctiva.  Normal TM's bilaterally.  Normal auditory canals and external ears. Normal external nose, mucus membranes and septum.  Normal pharynx. Neck supple with no masses, nodes, nodules or enlargement. Cardiovascular - regular rate and rhythm Lungs - chest clear, no wheezing, rales, normal  symmetric air entry, Heart exam - S1, S2 normal, no murmur, no gallop, rate regular Abdomen - soft, non-tender; bowel sounds normal; no masses,  no organomegaly Extremities - no edema  Neurologic Examination: Patient is alert, awake, oriented x3,  Dysarthric, follows commands. NO neglect noted CN: PERRLA, right gaze deviation crossing midline, questionable right hemianopsia, left facial, face sensation to touch is nle, palate and tongue midilne Motor:  Drift on the LUEX/LLEX otherwise strenght id 4/5 IN LUEX/LLEX 5/5 on RUEX/RLEX No sensory deficit appreciated Dysmetria on FN on the left and knee to heel on the left DTR not checked Gait not checked   Results for orders  placed or performed during the hospital encounter of 05/21/19 (from the past 48 hour(s))  Protime-INR     Status: None   Collection Time: 05/21/19  5:35 PM  Result Value Ref Range   Prothrombin Time 13.8 11.4 - 15.2 seconds   INR 1.1 0.8 - 1.2    Comment: (NOTE) INR goal varies based on device and disease states. Performed at [facility], [address] [place], [place] [address]   APTT     Status: None   Collection Time: 05/21/19  5:35 PM  Result Value Ref Range   aPTT 25 24 - 36 seconds    Comment: Performed at [facility], [address] [place], [place] [address]  CBC     Status: Abnormal   Collection Time: 05/21/19  5:35 PM  Result Value Ref Range   WBC 25.4 (H) 4.0 - 10.5 K/uL   RBC 6.11 (H) 4.22 - 5.81 MIL/uL   Hemoglobin 18.6 (H) 13.0 - 17.0 g/dL   HCT 53.5 (H) 39.0 - 52.0 %   MCV 87.6 80.0 - 100.0 fL   MCH 30.4 26.0 - 34.0 pg   MCHC 34.8 30.0 - 36.0 g/dL   RDW 13.6 11.5 - 15.5 %   Platelets 309 150 - 400 K/uL   nRBC 0.0 0.0 - 0.2 %    Comment: Performed at [facility], [address] [place], [place] [address]  Differential     Status: Abnormal   Collection Time: 05/21/19  5:35 PM  Result Value Ref Range   Neutrophils Relative % 87 %   Neutro Abs 21.9 (H) 1.7 - 7.7 K/uL   Lymphocytes Relative 3 %   Lymphs Abs 0.9 0.7 - 4.0 K/uL   Monocytes Relative 9 %   Monocytes Absolute 2.2 (H) 0.1 - 1.0 K/uL   Eosinophils Relative 0 %   Eosinophils Absolute 0.0 0.0 - 0.5 K/uL   Basophils Relative 0 %   Basophils Absolute 0.1 0.0 - 0.1 K/uL   WBC Morphology VACUOLATED NEUTROPHILS    RBC Morphology MORPHOLOGY UNREMARKABLE    Smear Review Normal platelet morphology    Immature Granulocytes 1 %   Abs Immature Granulocytes 0.33 (H) 0.00 - 0.07 K/uL    Comment: Performed at [facility], [address] [place], [place] [address]  Comprehensive metabolic panel     Status: Abnormal   Collection Time: 05/21/19  5:35 PM  Result Value  Ref Range   Sodium 137 135 - 145 mmol/L   Potassium 3.6 3.5 - 5.1 mmol/L   Chloride 101 98 - 111 mmol/L   CO2 20 (L) 22 - 32 mmol/L   Glucose, Bld 170 (H) 70 - 99 mg/dL   BUN 28 (H) 8 - 23 mg/dL   Creatinine, Ser 1.28 (H) 0.61 - 1.24 mg/dL   Calcium  8.9 8.9 - 10.3 mg/dL   Total Protein 7.2 6.5 - 8.1 g/dL   Albumin 4.0 3.5 - 5.0 g/dL   AST 45 (H) 15 - 41 U/L   ALT 20 0 - 44 U/L   Alkaline Phosphatase 78 38 - 126 U/L   Total Bilirubin 1.5 (H) 0.3 - 1.2 mg/dL   GFR calc non Af Amer 49 (L) >60 mL/min   GFR calc Af Amer 57 (L) >60 mL/min   Anion gap 16 (H) 5 - 15    Comment: Performed at [facility], [address] [place], [place] [address]  CK     Status: Abnormal   Collection Time: 05/21/19  5:35 PM  Result Value Ref Range   Total CK 970 (H) 49 - 397 U/L    Comment: Performed at [facility], [address] [place], [place] [address]  Brain natriuretic peptide     Status: Abnormal   Collection Time: 05/21/19  5:35 PM  Result Value Ref Range   B Natriuretic Peptide 828.0 (H) 0.0 - 100.0 pg/mL    Comment: Performed at [facility], [address], [place], [place] [address]  Urine Drug Screen, Qualitative     Status: None   Collection Time: 05/21/19  7:26 PM  Result Value Ref Range   Tricyclic, Ur Screen NONE DETECTED NONE DETECTED   Amphetamines, Ur Screen NONE DETECTED NONE DETECTED   MDMA (Ecstasy)Ur Screen NONE DETECTED NONE DETECTED   Cocaine Metabolite,Ur [place] NONE DETECTED NONE DETECTED   Opiate, Ur Screen NONE DETECTED NONE DETECTED   Phencyclidine (PCP) Ur S NONE DETECTED NONE DETECTED   Cannabinoid 50 Ng, Ur [place] NONE DETECTED NONE DETECTED   Barbiturates, Ur Screen NONE DETECTED NONE DETECTED   Benzodiazepine, Ur Scrn NONE DETECTED NONE DETECTED   Methadone Scn, Ur NONE DETECTED NONE DETECTED    Comment: (NOTE) Tricyclics + metabolites, urine    Cutoff 1000 ng/mL Amphetamines + metabolites, urine  Cutoff 1000 ng/mL MDMA (Ecstasy), urine               Cutoff 500 ng/mL Cocaine Metabolite, urine          Cutoff 300 ng/mL Opiate + metabolites, urine        Cutoff 300 ng/mL Phencyclidine (PCP), urine         Cutoff 25 ng/mL Cannabinoid, urine                 Cutoff 50 ng/mL Barbiturates + metabolites, urine  Cutoff 200 ng/mL Benzodiazepine, urine              Cutoff 200 ng/mL Methadone, urine                   Cutoff 300 ng/mL The urine drug screen provides only a preliminary, unconfirmed analytical test result and should not be used for non-medical purposes. Clinical consideration and professional judgment should be applied to any positive drug screen result due to possible interfering substances. A more specific alternate chemical method must be used in order to obtain a confirmed analytical result. Gas chromatography / mass spectrometry (GC/MS) is the preferred confirmat ory method. Performed at [facility], [address] [place], [place] [address]   Urinalysis, Routine w reflex microscopic     Status: Abnormal   Collection Time: 05/21/19  7:26 PM  Result Value Ref Range   Color, Urine YELLOW (A) YELLOW   APPearance HAZY (A) CLEAR   Specific Gravity, Urine 1.023 1.005 -  1.030   pH 5.0 5.0 - 8.0   Glucose, UA 50 (A) NEGATIVE mg/dL   Hgb urine dipstick MODERATE (A) NEGATIVE   Bilirubin Urine NEGATIVE NEGATIVE   Ketones, ur 5 (A) NEGATIVE mg/dL   Protein, ur 100 (A) NEGATIVE mg/dL   Nitrite NEGATIVE NEGATIVE   Leukocytes,Ua NEGATIVE NEGATIVE   RBC / HPF 0-5 0 - 5 RBC/hpf   WBC, UA 6-10 0 - 5 WBC/hpf   Bacteria, UA RARE (A) NONE SEEN   Squamous Epithelial / LPF 0-5 0 - 5   Mucus PRESENT    Hyaline Casts, UA PRESENT    Granular Casts, UA PRESENT    Cellular Cast, UA PRESENT     Comment: Performed at [facility], [address], [place], [place] [address]  SARS Coronavirus 2 (CEPHEID - Performed in [place] hospital lab), Hosp Order     Status: None   Collection Time: 05/21/19  7:26 PM    Specimen: Nasopharyngeal Swab  Result Value Ref Range   SARS Coronavirus 2 NEGATIVE NEGATIVE    Comment: (NOTE) If result is NEGATIVE SARS-CoV-2 target nucleic acids are NOT DETECTED. The SARS-CoV-2 RNA is generally detectable in upper and lower  respiratory specimens during the acute phase of infection. The lowest  concentration of SARS-CoV-2 viral copies this assay can detect is 250  copies / mL. A negative result does not preclude SARS-CoV-2 infection  and should not be used as the sole basis for treatment or other  patient management decisions.  A negative result may occur with  improper specimen collection / handling, submission of specimen other  than nasopharyngeal swab, presence of viral mutation(s) within the  areas targeted by this assay, and inadequate number of viral copies  (<250 copies / mL). A negative result must be combined with clinical  observations, patient history, and epidemiological information. If result is POSITIVE SARS-CoV-2 target nucleic acids are DETECTED. The SARS-CoV-2 RNA is generally detectable in upper and lower  respiratory specimens dur ing the acute phase of infection.  Positive  results are indicative of active infection with SARS-CoV-2.  Clinical  correlation with patient history and other diagnostic information is  necessary to determine patient infection status.  Positive results do  not rule out bacterial infection or co-infection with other viruses. If result is PRESUMPTIVE POSTIVE SARS-CoV-2 nucleic acids MAY BE PRESENT.   A presumptive positive result was obtained on the submitted specimen  and confirmed on repeat testing.  While 2019 novel coronavirus  (SARS-CoV-2) nucleic acids may be present in the submitted sample  additional confirmatory testing may be necessary for epidemiological  and / or clinical management purposes  to differentiate between  SARS-CoV-2 and other Sarbecovirus currently known to infect humans.  If clinically  indicated additional testing with an alternate test  methodology [phone]) is advised. The SARS-CoV-2 RNA is generally  detectable in upper and lower respiratory sp ecimens during the acute  phase of infection. The expected result is Negative. Fact Sheet for Patients:  StrictlyIdeas.no Fact Sheet for Healthcare Providers: BankingDealers.co.za This test is not yet approved or cleared by the [place] FDA and has been authorized for detection and/or diagnosis of SARS-CoV-2 by FDA under an Emergency Use Authorization (EUA).  This EUA will remain in effect (meaning this test can be used) for the duration of the COVID-19 declaration under Section 564(b)(1) of the Act, 21 U.S.C. section 360bbb-3(b)(1), unless the authorization is terminated or revoked sooner. Performed at [facility], [address] [place], [place] [address]   Blood Culture (routine x 2)     Status: None (Preliminary result)   Collection Time: 05/21/19  7:26 PM   Specimen: BLOOD  Result Value Ref Range   Specimen Description BLOOD RIGHT HAND    Special Requests      BOTTLES DRAWN AEROBIC AND ANAEROBIC Blood Culture adequate volume   Culture      NO GROWTH 2 DAYS Performed at [facility], [address] [place], [place] [address]    Report Status PENDING   Blood Culture (routine x 2)     Status: None (Preliminary result)   Collection Time: 05/21/19  7:50 PM   Specimen: BLOOD  Result Value Ref Range   Specimen Description BLOOD RIGHT ANTECUBITAL    Special Requests      BOTTLES DRAWN AEROBIC AND ANAEROBIC Blood Culture results may not be optimal due to an excessive volume of blood received in culture bottles   Culture      NO GROWTH 2 DAYS Performed at [facility], [address] [place], [place] [address]    Report Status PENDING   Glucose, capillary     Status: Abnormal   Collection Time: 05/21/19  9:01 PM  Result Value Ref  Range   Glucose-Capillary 166 (H) 70 - 99 mg/dL  Lipid panel     Status: Abnormal   Collection Time: 05/22/19  7:56 AM  Result Value Ref Range   Cholesterol 174 0 - 200 mg/dL   Triglycerides 60 <150 mg/dL   HDL 47 >40 mg/dL   Total CHOL/HDL Ratio 3.7 RATIO   VLDL 12 0 - 40 mg/dL   LDL Cholesterol 115 (H) 0 - 99 mg/dL    Comment:        Total Cholesterol/HDL:CHD Risk Coronary Heart Disease Risk Table                     Men   Women  1/2 Average Risk   3.4   3.3  Average Risk       5.0   4.4  2 X Average Risk   9.6   7.1  3 X Average Risk  23.4   11.0        Use the calculated Patient Ratio above and the CHD Risk Table to determine the patient's CHD Risk.        ATP III CLASSIFICATION (LDL):  <100     mg/dL   Optimal  100-129  mg/dL   Near or Above                    Optimal  130-159  mg/dL   Borderline  160-189  mg/dL   High  >190     mg/dL   Very High Performed at [facility], [address] [place], [place] [address]   Blood gas, arterial     Status: Abnormal   Collection Time: 05/23/19  5:32 AM  Result Value Ref Range   FIO2 0.44    Delivery systems [place] 6 LPM    pH, Arterial 7.47 (H) 7.350 - 7.450   pCO2 arterial 31 (L) 32.0 - 48.0 mmHg   pO2, Arterial 57 (L) 83.0 - 108.0 mmHg   Bicarbonate 22.6 20.0 - 28.0 mmol/L   Acid-base deficit 0.2 0.0 - 2.0 mmol/L   O2 Saturation 91.1 %   Patient temperature 37.0    Collection site RIGHT RADIAL    Sample type ARTERIAL DRAW  Allens test (pass/fail) PASS PASS    Comment: Performed at [facility], [address] [place], [place] [address]   US Carotid Bilateral (at [facility] And [facility] Only)  Result Date: 05/23/2019 CLINICAL DATA:  85 year old male for with a history of stroke EXAM: BILATERAL CAROTID DUPLEX ULTRASOUND TECHNIQUE: Pearline Cables scale imaging, color Doppler and duplex ultrasound were performed of bilateral carotid and vertebral arteries in the neck. COMPARISON:  No prior duplex FINDINGS: Criteria:  Quantification of carotid stenosis is based on velocity parameters that correlate the residual internal carotid diameter with NASCET-based stenosis levels, using the diameter of the distal internal carotid lumen as the denominator for stenosis measurement. The following velocity measurements were obtained: RIGHT ICA:  Systolic 423 cm/sec, Diastolic 5 cm/sec CCA:  40 cm/sec SYSTOLIC ICA/CCA RATIO:  2.7 ECA:  86 cm/sec LEFT ICA:  Systolic 84 cm/sec, Diastolic 29 cm/sec CCA:  60 cm/sec SYSTOLIC ICA/CCA RATIO:  1.4 ECA:  77 cm/sec Right Brachial SBP: Not acquired Left Brachial SBP: Not acquired RIGHT CAROTID ARTERY: No significant calcifications of the right common carotid artery. High resistance waveform maintained. Moderate heterogeneous and partially calcified plaque at the right carotid bifurcation. No significant lumen shadowing. High resistance waveform of the right ICA. Mild tortuosity RIGHT VERTEBRAL ARTERY: Antegrade flow with low resistance waveform. LEFT CAROTID ARTERY: No significant calcifications of the left common carotid artery. Intermediate waveform maintained. Moderate heterogeneous and partially calcified plaque at the left carotid bifurcation. No significant lumen shadowing. Low resistance waveform of the left ICA. No significant tortuosity. LEFT VERTEBRAL ARTERY:  Antegrade flow with low resistance waveform. IMPRESSION: Right: Heterogeneous and partially calcified plaque at the right carotid bifurcation, with duplex evidence of either a cervical ICA or intracranial ICA occlusion. If warranted, further evaluation with either cerebral angiogram, or CT angiogram may be considered. Left: Color duplex indicates minimal heterogeneous and calcified plaque, with no hemodynamically significant stenosis by duplex criteria in the extracranial cerebrovascular circulation. Signed, Dulcy Fanny. Dellia Nims, RPVI [facility] Electronically Signed   By: Corrie Mckusick D.O.   On: 05/23/2019 07:39   Dg Chest Port 1 View  Result Date: 05/22/2019 CLINICAL DATA:  Shortness of breath. EXAM: PORTABLE CHEST 1 VIEW COMPARISON:  Radiograph yesterday. Chest CT 12/14/2015 FINDINGS: Left-sided pacemaker remains in place. Normal heart size and mediastinal contours with aortic atherosclerosis. Unchanged vascular congestion. No acute airspace disease. No pleural fluid or pneumothorax. IMPRESSION: Unchanged vascular congestion. No new abnormality. Electronically Signed   By: Keith Rake M.D.   On: 05/22/2019 23:31   Dg Chest Portable 1 View  Result Date: 05/21/2019 CLINICAL DATA:  Unwitnessed fall. EXAM: PORTABLE CHEST 1 VIEW COMPARISON:  07/17/2018 FINDINGS: Stable dual lead cardiac pacemaker. Cardiomediastinal silhouette is normal. Mediastinal contours appear intact. Calcific atherosclerotic disease of the aorta. There is no evidence of focal airspace consolidation, pleural effusion or pneumothorax. Mildly increased interstitial markings. Osseous structures are without acute abnormality. Soft tissues are grossly normal. IMPRESSION: Mildly increased interstitial markings which may be seen with pulmonary vascular congestion. Electronically Signed   By: Fidela Salisbury M.D.   On: 05/21/2019 18:26   Ct Head Code Stroke Wo Contrast  Result Date: 05/21/2019 CLINICAL DATA:  Code stroke. Unwitnessed fall at home. Last seen normal 7 p.m. 05/20/2019. Clinically, RIGHT gaze preference with LEFT hemiparesis. EXAM: CT HEAD WITHOUT CONTRAST TECHNIQUE: Contiguous axial images were obtained from the base of the skull through the vertex without intravenous contrast. COMPARISON:  None. FINDINGS: The patient was  unable to remain motionless for the exam. Small or subtle lesions could be overlooked. Assessment of ASPECTS score is limited. Brain: There are multiple areas of hypoattenuation, in the RIGHT hemisphere, consistent with acute infarction. These involve the RIGHT basal ganglia  and internal capsule, insula, anterior and posterior temporal lobe, without visible hemorrhage. Elsewhere there is generalized atrophy with chronic microvascular ischemic change. BILATERAL extra-axial CSF collections likely represent atrophy rather than hygromas. Vascular: Calcification of the cavernous internal carotid arteries consistent with cerebrovascular atherosclerotic disease. Asymmetric hyperattenuation of the RIGHT ICA and MCA could represent a large vessel occlusion, but assessment is limited due to motion. Skull: Intact Sinuses/Orbits: No acute finding. Other: None. ASPECTS [facility] Stroke Program Early CT Score) - Ganglionic level infarction (caudate, lentiform nuclei, internal capsule, insula, M1-M3 cortex): 3 - Supraganglionic infarction (M4-M6 cortex): 3 Total score (0-10 with 10 being normal): 6 IMPRESSION: 1. Motion degraded exam demonstrating multiple areas of acute nonhemorrhagic infarction in the RIGHT hemisphere. 2. ASPECTS is 6, could be less.  Assessment is limited. 3. Asymmetric hyperattenuation of the RIGHT ICA and MCA could represent large vessel occlusion. These results were called by telephone at the time of interpretation on 05/21/2019 at 6:10 pm to Dr. Delman Kitten , who verbally acknowledged these results. * Electronically Signed   By: Staci Righter M.D.   On: 05/21/2019 18:15    Assessment:   85 y.o. male with a known history of hypertension, GERD, hyperlipidemia, cataract surgery on both eyes, pacemaker placement for what seems to be sick sinus syndrome admitted with a left sided weakness in whom head ct showing hyperdense MCA and acute infarction oo right MCA territory  - Unable to obtain MRI due to PPM - needs pt/ot  - will need  eliquis for A-flutter/stroke prevention - For now ASA daily/rectal until passes swallow eval - eliquis on day 5 post admission as good size stroke - Will likely need placement to [facility]/rehab

## 2019-05-23 NOTE — Progress Notes (Signed)
Stopped patient's fluids, rales bilaterally. Received verbal order from Gardiner Barefoot NP for stat chest x ray, following xray, received verbal order for lasix 40mg  one time. BP 172/67, HR 59, resp 22, 96% on 3L. Will continue to monitor.

## 2019-05-23 NOTE — Progress Notes (Signed)
[facility] Cardiology  SUBJECTIVE: The patient is lethargic this morning, answers yes or no questions. Night nurse, Colletta Maryland, reports that patient appears to be fluid-overloaded, and had 1L urine output with IV Lasix. Patient denies pain at this time, chest pain, shortness of breath, or palpitations.   Vitals:   05/23/19 0237 05/23/19 0438 05/23/19 0513 05/23/19 0520  BP: (!) 163/71 (!) 151/69 (!) 144/64   Pulse: 62 61 60   Resp: (!) 22 (!) 22 (!) 22 (!) 22  Temp:   97.9 F (36.6 C)   TempSrc:   Oral   SpO2: 92% 91% (!) 85% 90%  Weight:      Height:         Intake/Output Summary (Last 24 hours) at 05/23/2019 0845 Last data filed at 05/23/2019 0246 Gross per 24 hour  Intake 737.5 ml  Output 1175 ml  Net -437.5 ml      PHYSICAL EXAM  General: Elderly gentleman lying in bed, lethargic, eyes closed, in no acute distress HEENT:  Normocephalic and atramatic Neck:  No JVD.  Lungs: normal effort of breathing on supplemental oxygen, no audible wheezing  Heart: HRRR . Normal S1 and S2 without gallops or murmurs.  Abdomen: nondistended Msk:  Back normal, normal gait. Normal strength and tone for age. Extremities: Trace bilateral lower extremity edema.   Neuro: Alert and oriented X 3. Psych:  Good affect, responds appropriately   LABS: Basic Metabolic Panel: Recent Labs    05/21/19 1735  NA 137  K 3.6  CL 101  CO2 20*  GLUCOSE 170*  BUN 28*  CREATININE 1.28*  CALCIUM 8.9   Liver Function Tests: Recent Labs    05/21/19 1735  AST 45*  ALT 20  ALKPHOS 78  BILITOT 1.5*  PROT 7.2  ALBUMIN 4.0   No results for input(s): LIPASE, AMYLASE in the last 72 hours. CBC: Recent Labs    05/21/19 1735  WBC 25.4*  NEUTROABS 21.9*  HGB 18.6*  HCT 53.5*  MCV 87.6  PLT 309   Cardiac Enzymes: Recent Labs    05/21/19 1735  CKTOTAL 970*   BNP: Invalid input(s): POCBNP D-Dimer: No results for input(s): DDIMER in the last 72 hours. Hemoglobin A1C: No results for input(s):  HGBA1C in the last 72 hours. Fasting Lipid Panel: Recent Labs    05/22/19 0756  CHOL 174  HDL 47  LDLCALC 115*  TRIG 60  CHOLHDL 3.7   Thyroid Function Tests: No results for input(s): TSH, T4TOTAL, T3FREE, THYROIDAB in the last 72 hours.  Invalid input(s): FREET3 Anemia Panel: No results for input(s): VITAMINB12, FOLATE, FERRITIN, TIBC, IRON, RETICCTPCT in the last 72 hours.  US Carotid Bilateral (at [facility] And Ap Only)  Result Date: 05/23/2019 CLINICAL DATA:  85 year old male for with a history of stroke EXAM: BILATERAL CAROTID DUPLEX ULTRASOUND TECHNIQUE: Pearline Cables scale imaging, color Doppler and duplex ultrasound were performed of bilateral carotid and vertebral arteries in the neck. COMPARISON:  No prior duplex FINDINGS: Criteria: Quantification of carotid stenosis is based on velocity parameters that correlate the residual internal carotid diameter with NASCET-based stenosis levels, using the diameter of the distal internal carotid lumen as the denominator for stenosis measurement. The following velocity measurements were obtained: RIGHT ICA:  Systolic 053 cm/sec, Diastolic 5 cm/sec CCA:  40 cm/sec SYSTOLIC ICA/CCA RATIO:  2.7 ECA:  86 cm/sec LEFT ICA:  Systolic 84 cm/sec, Diastolic 29 cm/sec CCA:  60 cm/sec SYSTOLIC ICA/CCA RATIO:  1.4 ECA:  77 cm/sec Right Brachial SBP: Not  acquired Left Brachial SBP: Not acquired RIGHT CAROTID ARTERY: No significant calcifications of the right common carotid artery. High resistance waveform maintained. Moderate heterogeneous and partially calcified plaque at the right carotid bifurcation. No significant lumen shadowing. High resistance waveform of the right ICA. Mild tortuosity RIGHT VERTEBRAL ARTERY: Antegrade flow with low resistance waveform. LEFT CAROTID ARTERY: No significant calcifications of the left common carotid artery. Intermediate waveform maintained. Moderate heterogeneous and partially calcified plaque at the left carotid bifurcation. No  significant lumen shadowing. Low resistance waveform of the left ICA. No significant tortuosity. LEFT VERTEBRAL ARTERY:  Antegrade flow with low resistance waveform. IMPRESSION: Right: Heterogeneous and partially calcified plaque at the right carotid bifurcation, with duplex evidence of either a cervical ICA or intracranial ICA occlusion. If warranted, further evaluation with either cerebral angiogram, or CT angiogram may be considered. Left: Color duplex indicates minimal heterogeneous and calcified plaque, with no hemodynamically significant stenosis by duplex criteria in the extracranial cerebrovascular circulation. Signed, Dulcy Fanny. Dellia Nims, RPVI [facility] Electronically Signed   By: Corrie Mckusick D.O.   On: 05/23/2019 07:39   Dg Chest Port 1 View  Result Date: 05/22/2019 CLINICAL DATA:  Shortness of breath. EXAM: PORTABLE CHEST 1 VIEW COMPARISON:  Radiograph yesterday. Chest CT 12/14/2015 FINDINGS: Left-sided pacemaker remains in place. Normal heart size and mediastinal contours with aortic atherosclerosis. Unchanged vascular congestion. No acute airspace disease. No pleural fluid or pneumothorax. IMPRESSION: Unchanged vascular congestion. No new abnormality. Electronically Signed   By: Keith Rake M.D.   On: 05/22/2019 23:31   Dg Chest Portable 1 View  Result Date: 05/21/2019 CLINICAL DATA:  Unwitnessed fall. EXAM: PORTABLE CHEST 1 VIEW COMPARISON:  07/17/2018 FINDINGS: Stable dual lead cardiac pacemaker. Cardiomediastinal silhouette is normal. Mediastinal contours appear intact. Calcific atherosclerotic disease of the aorta. There is no evidence of focal airspace consolidation, pleural effusion or pneumothorax. Mildly increased interstitial markings. Osseous structures are without acute abnormality. Soft tissues are grossly normal. IMPRESSION: Mildly increased interstitial markings which may be seen with pulmonary vascular congestion.  Electronically Signed   By: Fidela Salisbury M.D.   On: 05/21/2019 18:26   Ct Head Code Stroke Wo Contrast  Result Date: 05/21/2019 CLINICAL DATA:  Code stroke. Unwitnessed fall at home. Last seen normal 7 p.m. 05/20/2019. Clinically, RIGHT gaze preference with LEFT hemiparesis. EXAM: CT HEAD WITHOUT CONTRAST TECHNIQUE: Contiguous axial images were obtained from the base of the skull through the vertex without intravenous contrast. COMPARISON:  None. FINDINGS: The patient was unable to remain motionless for the exam. Small or subtle lesions could be overlooked. Assessment of ASPECTS score is limited. Brain: There are multiple areas of hypoattenuation, in the RIGHT hemisphere, consistent with acute infarction. These involve the RIGHT basal ganglia and internal capsule, insula, anterior and posterior temporal lobe, without visible hemorrhage. Elsewhere there is generalized atrophy with chronic microvascular ischemic change. BILATERAL extra-axial CSF collections likely represent atrophy rather than hygromas. Vascular: Calcification of the cavernous internal carotid arteries consistent with cerebrovascular atherosclerotic disease. Asymmetric hyperattenuation of the RIGHT ICA and MCA could represent a large vessel occlusion, but assessment is limited due to motion. Skull: Intact Sinuses/Orbits: No acute finding. Other: None. ASPECTS [facility] Stroke Program Early CT Score) - Ganglionic level infarction (caudate, lentiform nuclei, internal capsule, insula, M1-M3 cortex): 3 - Supraganglionic infarction (M4-M6 cortex): 3 Total score (0-10 with 10 being normal): 6 IMPRESSION: 1. Motion degraded exam demonstrating multiple areas of acute nonhemorrhagic infarction in the RIGHT hemisphere. 2. ASPECTS  is 6, could be less.  Assessment is limited. 3. Asymmetric hyperattenuation of the RIGHT ICA and MCA could represent large vessel occlusion. These results were called by telephone at the time of interpretation on 05/21/2019 at  6:10 pm to Dr. Delman Kitten , who verbally acknowledged these results. * Electronically Signed   By: Staci Righter M.D.   On: 05/21/2019 18:15     Echo EF 55-60%, mild AS  TELEMETRY: paced rhythm, atrial flutter, 60 bpm  ASSESSMENT AND PLAN:  Active Problems:   Stroke ([place])    1. Atrial fibrillation/flutter, rate controlled with ventricular pacing. Chads vasc score of 5, started on low dose Eliquis for stroke prevention. 2D echocardiogram revealed normal left ventricular function without evidence of cardiac emboli 2. Acute stroke, head CT with hyperdense right MCA/questionable right ICA and right MCA territory acute infarction.  3. Acute on chronic diastolic CHF, O2 saturations at 94% on 6 L O2 nasal cannula. BNP 828. Chest xray revealed no pleural fluid, with vascular congestion.  4. Possible aspiration pneumonia, on IV antibiotics 5. Chronic renal failure 6. Status post dual-chamber pacemaker implantation  Recommendations: 1. Continue low dose Eliquis for stroke prevention 2. Add IV Lasix 40 mg once daily for fluid retention with careful monitoring of renal status 3. Recommend adding beta blocker by discharge if blood pressure permits 4. Gentle hydration as patient is NPO 5. Continue aspirin and statin  Clabe Seal, PA-C 05/23/2019 8:45 AM

## 2019-05-23 NOTE — Progress Notes (Signed)
Notified house supervisor, Webb Silversmith if patient's granddaughter should be at bedside of patient. Patient is compliant, A/OX4, non-combative.

## 2019-05-23 NOTE — Progress Notes (Signed)
SLP Cancellation Note  Patient Details Name: Jose Castro MRN: [id number] DOB: [birth year]   Cancelled treatment:       Reason Eval/Treat Not Completed: Medical issues which prohibited therapy;Patient not medically ready(chart reviewed; consulted NSG re: pt's status this AM). NSG reported pt is not appropriate for BSE at this time. ST services will f/u next 1-2 days for appropriateness for BSE. Recommend frequent oral care for hygiene and stimulation of swallowing.    Orinda Kenner, MS, CCC-SLP Watson,Katherine 05/23/2019, 9:12 AM

## 2019-05-23 NOTE — Progress Notes (Signed)
[place] [place] NAME: Jose Castro    MR#:  [id number]  DATE OF BIRTH:  08-22-1926  SUBJECTIVE:   Chief Complaint  Patient presents with  . Fall   Patient is seen at the bedside. He appears lethargic but able to follow commands without any difficulties. He is noted with significant dysathria. See detailed neuro exam. No new strokes or stroke like symptoms reported.  REVIEW OF SYSTEMS:  ROS Unable to obtain from patient due to significant dysarthria DRUG ALLERGIES:   Allergies  Allergen Reactions  . Penicillins Itching and Swelling    Has patient had a PCN reaction causing immediate rash, facial/tongue/throat swelling, SOB or lightheadedness with hypotension: No Has patient had a PCN reaction causing severe rash involving mucus membranes or skin necrosis: No Has patient had a PCN reaction that required hospitalization: No Has patient had a PCN reaction occurring within the last 10 years: No If all of the above answers are "NO", then may proceed with Cephalosporin use.    VITALS:  Blood pressure (!) 153/72, pulse 62, temperature 98.1 F (36.7 C), temperature source Oral, resp. rate 18, height 5\' 10"  (1.778 m), weight 82.6 kg, SpO2 94 %. PHYSICAL EXAMINATION:   GENERAL:  85 y.o.-year-old patient lying in the bed with no acute distress.  EYES: Pupils equal, round, reactive to light and accommodation. No scleral icterus. Extraocular muscles intact.  HEENT: Head atraumatic, normocephalic. Oropharynx and nasopharynx clear.  NECK:  Supple, no jugular venous distention. No thyroid enlargement, no tenderness.  LUNGS: Normal breath sounds bilaterally, no wheezing, rales,rhonchi or crepitation. No use of accessory muscles of respiration.  CARDIOVASCULAR: S1, S2 normal. No murmurs, rubs, or gallops.  ABDOMEN: Soft, nontender, nondistended. Bowel sounds present. No organomegaly or mass.  EXTREMITIES: No pedal edema, cyanosis, or clubbing.  No rash or lesions. + pedal pulses MUSCULOSKELETAL: Normal bulk, and power was 5+ grip and elbow, knee, and ankle flexion and extension bilaterally.  NEUROLOGIC: Patient awake, oriented x 3. Follows commands. Eye opening apraxia. Extraocular movements intact. Left homonomyous hemianopia. Left facial weakness. Dysarthria. No aphasia. Left upper extremity with 4/5 strength. Left lower extremity with 4/5 strength. Moves right side without difficulty, strength normal. Right arm orbits left. Plantars up on the right, down on the left. Somewhat decreased sensation on the left side of the body. Gait not tested due to safety concern. PSYCHIATRIC: The patient is alert and oriented x 3.  SKIN: No obvious rash, lesion, or ulcer.   DATA REVIEWED:  LABORATORY PANEL:  Male CBC Recent Labs  Lab 05/23/19 1253  WBC 19.0*  HGB 16.6  HCT 49.6  PLT 270   ------------------------------------------------------------------------------------------------------------------ Chemistries  Recent Labs  Lab 05/23/19 1253  NA 141  K 3.5  CL 103  CO2 26  GLUCOSE 121*  BUN 42*  CREATININE 1.35*  CALCIUM 8.5*  AST 20  ALT 17  ALKPHOS 62  BILITOT 2.1*   RADIOLOGY:  US Carotid Bilateral (at [facility] And Ap Only)  Result Date: 05/23/2019 CLINICAL DATA:  85 year old male for with a history of stroke EXAM: BILATERAL CAROTID DUPLEX ULTRASOUND TECHNIQUE: Pearline Cables scale imaging, color Doppler and duplex ultrasound were performed of bilateral carotid and vertebral arteries in the neck. COMPARISON:  No prior duplex FINDINGS: Criteria: Quantification of carotid stenosis is based on velocity parameters that correlate the residual internal carotid diameter with NASCET-based stenosis levels, using the diameter of the distal internal carotid lumen as the denominator for stenosis measurement. The  following velocity measurements were obtained: RIGHT ICA:  Systolic 761 cm/sec, Diastolic 5 cm/sec CCA:  40 cm/sec SYSTOLIC ICA/CCA RATIO:   2.7 ECA:  86 cm/sec LEFT ICA:  Systolic 84 cm/sec, Diastolic 29 cm/sec CCA:  60 cm/sec SYSTOLIC ICA/CCA RATIO:  1.4 ECA:  77 cm/sec Right Brachial SBP: Not acquired Left Brachial SBP: Not acquired RIGHT CAROTID ARTERY: No significant calcifications of the right common carotid artery. High resistance waveform maintained. Moderate heterogeneous and partially calcified plaque at the right carotid bifurcation. No significant lumen shadowing. High resistance waveform of the right ICA. Mild tortuosity RIGHT VERTEBRAL ARTERY: Antegrade flow with low resistance waveform. LEFT CAROTID ARTERY: No significant calcifications of the left common carotid artery. Intermediate waveform maintained. Moderate heterogeneous and partially calcified plaque at the left carotid bifurcation. No significant lumen shadowing. Low resistance waveform of the left ICA. No significant tortuosity. LEFT VERTEBRAL ARTERY:  Antegrade flow with low resistance waveform. IMPRESSION: Right: Heterogeneous and partially calcified plaque at the right carotid bifurcation, with duplex evidence of either a cervical ICA or intracranial ICA occlusion. If warranted, further evaluation with either cerebral angiogram, or CT angiogram may be considered. Left: Color duplex indicates minimal heterogeneous and calcified plaque, with no hemodynamically significant stenosis by duplex criteria in the extracranial cerebrovascular circulation. Signed, Dulcy Fanny. Dellia Nims, RPVI [facility] Inter[facility] Radiology Specialists [facility] Radiology Electronically Signed   By: Corrie Mckusick D.O.   On: 05/23/2019 07:39   Dg Chest Port 1 View  Result Date: 05/22/2019 CLINICAL DATA:  Shortness of breath. EXAM: PORTABLE CHEST 1 VIEW COMPARISON:  Radiograph yesterday. Chest CT 12/14/2015 FINDINGS: Left-sided pacemaker remains in place. Normal heart size and mediastinal contours with aortic atherosclerosis. Unchanged vascular congestion. No acute airspace disease. No pleural fluid  or pneumothorax. IMPRESSION: Unchanged vascular congestion. No new abnormality. Electronically Signed   By: Keith Rake M.D.   On: 05/22/2019 23:31   Dg Chest Portable 1 View  Result Date: 05/21/2019 CLINICAL DATA:  Unwitnessed fall. EXAM: PORTABLE CHEST 1 VIEW COMPARISON:  07/17/2018 FINDINGS: Stable dual lead cardiac pacemaker. Cardiomediastinal silhouette is normal. Mediastinal contours appear intact. Calcific atherosclerotic disease of the aorta. There is no evidence of focal airspace consolidation, pleural effusion or pneumothorax. Mildly increased interstitial markings. Osseous structures are without acute abnormality. Soft tissues are grossly normal. IMPRESSION: Mildly increased interstitial markings which may be seen with pulmonary vascular congestion. Electronically Signed   By: Fidela Salisbury M.D.   On: 05/21/2019 18:26   Ct Head Code Stroke Wo Contrast  Result Date: 05/21/2019 CLINICAL DATA:  Code stroke. Unwitnessed fall at home. Last seen normal 7 p.m. 05/20/2019. Clinically, RIGHT gaze preference with LEFT hemiparesis. EXAM: CT HEAD WITHOUT CONTRAST TECHNIQUE: Contiguous axial images were obtained from the base of the skull through the vertex without intravenous contrast. COMPARISON:  None. FINDINGS: The patient was unable to remain motionless for the exam. Small or subtle lesions could be overlooked. Assessment of ASPECTS score is limited. Brain: There are multiple areas of hypoattenuation, in the RIGHT hemisphere, consistent with acute infarction. These involve the RIGHT basal ganglia and internal capsule, insula, anterior and posterior temporal lobe, without visible hemorrhage. Elsewhere there is generalized atrophy with chronic microvascular ischemic change. BILATERAL extra-axial CSF collections likely represent atrophy rather than hygromas. Vascular: Calcification of the cavernous internal carotid arteries consistent with cerebrovascular atherosclerotic disease. Asymmetric  hyperattenuation of the RIGHT ICA and MCA could represent a large vessel occlusion, but assessment is limited due to motion. Skull: Intact Sinuses/Orbits: No acute finding.  Other: None. ASPECTS [facility] Stroke Program Early CT Score) - Ganglionic level infarction (caudate, lentiform nuclei, internal capsule, insula, M1-M3 cortex): 3 - Supraganglionic infarction (M4-M6 cortex): 3 Total score (0-10 with 10 being normal): 6 IMPRESSION: 1. Motion degraded exam demonstrating multiple areas of acute nonhemorrhagic infarction in the RIGHT hemisphere. 2. ASPECTS is 6, could be less.  Assessment is limited. 3. Asymmetric hyperattenuation of the RIGHT ICA and MCA could represent large vessel occlusion. These results were called by telephone at the time of interpretation on 05/21/2019 at 6:10 pm to Dr. Delman Kitten , who verbally acknowledged these results. * Electronically Signed   By: Staci Righter M.D.   On: 05/21/2019 18:15   ASSESSMENT AND PLAN:   85 y.o. 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  Patient was found to have right ICA and right MCA territory acute infarction.  1. Acute right MCA stroke -clinical presentation suggestive of LVO likely cardioembolic in a patient with new onset afib? sick sinus s/p pacemaker - Unable to obtain MRI brain due to pacemaker - Repeat CT head, worsening mental status/lethargy  this afternoon - Hold Eliquis, restart in 5 days from day of stroke  - Continue Aspirin rectally until able to swallow - Telemetry monitoring - Neurochecks and VS per stroke protocol - PT/OT/Speech - Neurology input appreciated - HTN,HLD, AFIB risk factor management as below  2. New onset atrial fibrillation -rate controlled - Holding Eliquis due to risk of hemorrhagic transformation in large territory  stroke - Aspirin as above  3. Aspiration pneumonia - Continue Clindamycin IV - Repeat chest xray tomorrow - Aspiration precautions  4. Elevated troponin-likely demand supply ischemia in the setting of acute stroke  5. Hypertension - Allow permissive hypertension in the setting of stroke. Will treat if SBP >160  6. CKD - Continue to monitor renal functions.  All the records are reviewed and case discussed with Care Management/Social Worker. Management plans discussed with the patient, family and they are in agreement.  CODE STATUS: Full Code  TOTAL TIME TAKING CARE OF THIS PATIENT: 35 minutes.   More than 50% of the time was spent in counseling/coordination of care: YES  POSSIBLE D/C IN 2 DAYS, DEPENDING ON CLINICAL CONDITION.   on 05/23/2019 at 2:05 PM  This patient was staffed with Dr. Manuella Ghazi, [place] who personally evaluated patient, reviewed documentation and agreed with assessment and plan of care as above.  Rufina Falco, DNP, FNP-BC [facility] Nurse Practitioner   Between 7am to 6pm - Pager [phone]  After 6pm go to [email or web address] - Technical brewer [facility]  Office  [phone]  CC: Primary care physician; Steele Sizer, MD  Note: This dictation was prepared with Dragon dictation along with smaller phrase technology. Any transcriptional errors that result from this process are unintentional.

## 2019-05-23 NOTE — Plan of Care (Addendum)

## 2019-05-23 NOTE — Progress Notes (Signed)
Patient's 02 sats 85% on 3L, 02 up to 90% on 4L, received verbal order from Dr. Sidney Ace for ABG. Continuing to monitor.

## 2019-05-23 NOTE — Progress Notes (Signed)
OT Cancellation Note  Patient Details Name: Jose Castro MRN: [id number] DOB: [birth year]   Cancelled Treatment:    Reason Eval/Treat Not Completed: Fatigue/lethargy limiting ability to participate. Consult received, chart reviewed. On attempt, pt asleep, difficult to rouse. Spoke with RN who requests therapy hold at this time due to pt's lethargy and oxygen requirements. Will continue to follow acutely and re-attempt at later date/time as appropriate.   Jeni Salles, MPH, MS, OTR/L ascom [phone] 05/23/19, 11:33 AM

## 2019-05-24 ENCOUNTER — Inpatient Hospital Stay: Payer: Medicare Other

## 2019-05-24 ENCOUNTER — Encounter: Payer: Self-pay | Admitting: Primary Care

## 2019-05-24 DIAGNOSIS — Z515 Encounter for palliative care: Secondary | ICD-10-CM

## 2019-05-24 DIAGNOSIS — Z7189 Other specified counseling: Secondary | ICD-10-CM

## 2019-05-24 LAB — BASIC METABOLIC PANEL
Anion gap: 15 (ref 5–15)
BUN: 51 mg/dL — ABNORMAL HIGH (ref 8–23)
CO2: 25 mmol/L (ref 22–32)
Calcium: 8.6 mg/dL — ABNORMAL LOW (ref 8.9–10.3)
Chloride: 103 mmol/L (ref 98–111)
Creatinine, Ser: 1.25 mg/dL — ABNORMAL HIGH (ref 0.61–1.24)
GFR calc Af Amer: 58 mL/min — ABNORMAL LOW (ref >60)
GFR calc non Af Amer: 50 mL/min — ABNORMAL LOW (ref >60)
Glucose, Bld: 117 mg/dL — ABNORMAL HIGH (ref 70–99)
Potassium: 3.5 mmol/L (ref 3.5–5.1)
Sodium: 143 mmol/L (ref 135–145)

## 2019-05-24 LAB — CBC WITH DIFFERENTIAL/PLATELET
Abs Immature Granulocytes: 0.07 10*3/uL (ref 0.00–0.07)
Basophils Absolute: 0.1 10*3/uL (ref 0.0–0.1)
Basophils Relative: 0 %
Eosinophils Absolute: 0 10*3/uL (ref 0.0–0.5)
Eosinophils Relative: 0 %
HCT: 50.4 % (ref 39.0–52.0)
Hemoglobin: 16.7 g/dL (ref 13.0–17.0)
Immature Granulocytes: 1 %
Lymphocytes Relative: 9 %
Lymphs Abs: 1.3 10*3/uL (ref 0.7–4.0)
MCH: 29.8 pg (ref 26.0–34.0)
MCHC: 33.1 g/dL (ref 30.0–36.0)
MCV: 90 fL (ref 80.0–100.0)
Monocytes Absolute: 1.6 10*3/uL — ABNORMAL HIGH (ref 0.1–1.0)
Monocytes Relative: 11 %
Neutro Abs: 11.8 10*3/uL — ABNORMAL HIGH (ref 1.7–7.7)
Neutrophils Relative %: 79 %
Platelets: 251 10*3/uL (ref 150–400)
RBC: 5.6 MIL/uL (ref 4.22–5.81)
RDW: 14.4 % (ref 11.5–15.5)
WBC: 14.8 10*3/uL — ABNORMAL HIGH (ref 4.0–10.5)
nRBC: 0 % (ref 0.0–0.2)

## 2019-05-24 LAB — HEMOGLOBIN A1C
Hgb A1c MFr Bld: 5.8 % — ABNORMAL HIGH (ref 4.8–5.6)
Mean Plasma Glucose: 120 mg/dL

## 2019-05-24 MED ORDER — CHLORHEXIDINE GLUCONATE 0.12 % MT SOLN
15.0000 mL | Freq: Two times a day (BID) | OROMUCOSAL | Status: DC
  Administered 2019-05-24 – 2019-05-26 (×2): 15 mL via OROMUCOSAL
  Filled 2019-05-24 (×3): qty 15

## 2019-05-24 MED ORDER — ORAL CARE MOUTH RINSE: 15.0000 mL | Freq: Two times a day (BID) | OROMUCOSAL | Status: DC

## 2019-05-24 NOTE — Evaluation (Signed)
Physical Therapy Evaluation Patient Details Name: SCHYLER COUNSELL MRN: [id number] DOB: [birth year] Today's Date: 05/24/2019   History of Present Illness  85 y.o. 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  Patient was found to have right ICA and right MCA territory acute infarction.  Repeat imaging significant for notation of cytotoxic edema (no hemorrhage) with mild midline shift to L.  Cleared by neurology for therapy.  Clinical Impression  Patient sleeping upon arrival to room; awakens with mod encouragement from therapist and granddaughter at bedside.  Once awake, patient alert and oriented to basic information; follows commands with increased time for processing.  L UE/LE L UE grossly 4-/5, positive drift in UE, mod coordination deficits; denies sensory deficit and noted with intact light touch (isolation), but positive extinction with simultaneous stimulation throughout L hemi-body. 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  Does report midl pain in R foot, bilat great toes; redness noted over R MTP joint (?gout?) and at tips of bilat great toes (?pressure?); RN informed/aware (encouraged no covers over bilat feet when resting).  Currently requiring mod assist for bed mobility; mod assist for sit/stand, basic transfers and gait (4-5') with RW.  Demonstrates forward flexed posture with mild/mod pushing towards L in stance; constant cuing for R ant/lateral weigh shift, constant asisst for walker. Guiding/management. Patient very eager for participate/progress as able; family at bedside very supportive and encouraging. Would benefit from skilled PT to address above deficits and promote optimal return to PLOF; recommend transition to acute inpatient rehab upon  discharge for high-intensity, post-acute rehab services.  Patient with good potential to make significant functional gains with access to appropriate post-acute rehab care.      Follow Up Recommendations CIR    Equipment Recommendations       Recommendations for Other Services       Precautions / Restrictions Precautions Precautions: Fall;ICD/Pacemaker Precaution Comments: Nectar liquids Restrictions Weight Bearing Restrictions: No      Mobility  Bed Mobility Overal bed mobility: Needs Assistance Bed Mobility: Supine to Sit     Supine to sit: Min assist;Mod assist     General bed mobility comments: cuing for L UE position/protection with divided attention  Transfers Overall transfer level: Needs assistance Equipment used: Rolling walker (2 wheeled) Transfers: Sit to/from Stand Sit to Stand: Mod assist         General transfer comment: cues for hand placement, assist for lift off, anterior weight translation and static balance  Ambulation/Gait Ambulation/Gait assistance: Mod assist Gait Distance (Feet): 5 Feet Assistive device: Rolling walker (2 wheeled)       General Gait Details: forward flexed posture with mild/mod pushing towards L in stance; constant cuing for R ant/lateral weigh shift, constant asisst for walker guiding/management  Stairs            Wheelchair Mobility    Modified Rankin (Stroke Patients Only)       Balance Overall balance assessment: Needs assistance Sitting-balance support: No upper extremity supported;Feet supported Sitting balance-Leahy Scale: Fair Sitting balance - Comments: L lateral lean with fatigue, divided attention     Standing balance-Leahy Scale: Poor                               Pertinent Vitals/Pain Pain  Assessment: Faces Faces Pain Scale: Hurts even more Pain Location: R foot, bilat great toes Pain Descriptors / Indicators: Discomfort Pain Intervention(s): Limited activity within  patient's tolerance;Monitored during session;Repositioned    Home Living Family/patient expects to be discharged to:: Private residence Living Arrangements: Alone Available Help at Discharge: Family;Available 24 hours/day(family willing to coordinate 24/7 care as needed at discharge) Type of Home: House Home Access: Stairs to enter   [employer] of Steps: 2 steps in back Home Layout: Two level;1/2 bath on main level;Bed/bath upstairs Home Equipment: None      Prior Function Level of Independence: Independent         Comments: Pt indep in all aspects, driving, and no additional falls.     Hand Dominance   Dominant Hand: Right    Extremity/Trunk Assessment   Upper Extremity Assessment Upper Extremity Assessment: (L UE grossly 4-/5, positive drift, mod coordination deficits; denies sensory deficit and noted with intact light touch (isolation), but positive extinction with simultaneous stimulation)    Lower Extremity Assessment Lower Extremity Assessment: (L LE grossly 4-/5, negative clonus/babinski, mod coordination deficits; denies sensory deficit and noted with intact light touch (isolation), but positive extinction with simultaneous stimulation)       Communication   Communication: HOH  Cognition Arousal/Alertness: Awake/alert Behavior During Therapy: WFL for tasks assessed/performed Overall Cognitive Status: Within Functional Limits for tasks assessed                                 General Comments: limited insight into deficits, increased time for processing      General Comments      Exercises Other Exercises Other Exercises: Visual assessment significant for L inattention, suspected hemoymous hemianopsia Other Exercises: Educated family (granddaughter) in positioning to L of patient when visiting to encourage attention to and visual scanning to L of midline.   Assessment/Plan    PT Assessment Patient needs continued PT services   PT Problem List Decreased strength;Decreased activity tolerance;Decreased mobility;Decreased cognition;Decreased safety awareness;Decreased range of motion;Decreased balance;Decreased coordination;Decreased knowledge of use of DME;Decreased knowledge of precautions;Impaired sensation       PT Treatment Interventions DME instruction;Therapeutic activities;Stair training;Balance training;Cognitive remediation;Gait training;Functional mobility training;Therapeutic exercise;Neuromuscular re-education;Patient/family education    PT Goals (Current goals can be found in the Care Plan section)  Acute Rehab PT Goals Patient Stated Goal: to get stronger and return home PT Goal Formulation: With patient/family Time For Goal Achievement: 06/07/19 Potential to Achieve Goals: Good    Frequency 7X/week   Barriers to discharge        Co-evaluation               AM-PAC PT "6 Clicks" Mobility  Outcome Measure Help needed turning from your back to your side while in a flat bed without using bedrails?: A Little Help needed moving from lying on your back to sitting on the side of a flat bed without using bedrails?: A Lot Help needed moving to and from a bed to a chair (including a wheelchair)?: A Lot Help needed standing up from a chair using your arms (e.g., wheelchair or bedside chair)?: A Lot Help needed to walk in hospital room?: A Lot Help needed climbing 3-5 steps with a railing? : Total 6 Click Score: 12    End of Session Equipment Utilized During Treatment: Gait belt Activity Tolerance: Patient tolerated treatment well Patient left: in chair;with call bell/phone within reach;with chair alarm set;with  family/visitor present Nurse Communication: Mobility status PT Visit Diagnosis: Hemiplegia and hemiparesis;Muscle weakness (generalized) (M62.81) Hemiplegia - Right/Left: Left Hemiplegia - dominant/non-dominant: Non-dominant Hemiplegia - caused by: Cerebral infarction    Time:  1712-7871 PT Time Calculation (min) (ACUTE ONLY): 39 min   Charges:   PT Evaluation $PT Eval Moderate Complexity: 1 Mod PT Treatments $Therapeutic Activity: 8-22 mins $Neuromuscular Re-education: 8-22 mins        Gottfried Standish H. Owens Shark, PT, DPT, NCS 05/24/19, 11:53 PM [phone]

## 2019-05-24 NOTE — TOC Initial Note (Signed)
Transition of Care [facility]) - Initial/Assessment Note    Patient Details  Name: Jose Castro MRN: [id number] Date of Birth: 02-19-1926  [facility]) CM/SW Contact:    Shelbie Hutching, RN Phone Number: 05/24/2019, 12:03 PM  Clinical Narrative:                 Patient admitted for stroke.  Granddaughter is at the bedside today, patient lying in bed with eyes closed, patient will not open eyes but does wave.  Patient is from home where he lives by himself and is very independent at baseline.  Patient and family are agreeable to SNF.  Daughter works at [employer] and this is [employer] of choice for the patient.  Daughter [name] contacted and updated on plan of care.  Request for bed sent to [facility].  RNCM will cont to follow.    Expected Discharge Plan: [place] Barriers to Discharge: Continued Medical Work up   Patient Goals and CMS Choice Patient states their goals for this hospitalization and ongoing recovery are:: Hope for patient to be able to go to [facility] for rehab and then be able to return home to independent living. CMS Medicare.gov Compare Post Acute Care list provided to:: Patient Choice offered to / list presented to : Patient  Expected Discharge Plan and Services Expected Discharge Plan: [place]   Discharge Planning Services: CM Consult Post Acute Care Choice: [place] Living arrangements for the past 2 months: Single Family Home Expected Discharge Date: 05/25/19                                    Prior Living Arrangements/Services Living arrangements for the past 2 months: Single Family Home Lives with:: Self Patient language and need for interpreter reviewed:: No Do you feel safe going back to the place where you live?: Yes      Need for Family Participation in Patient Care: Yes (Comment)(post stroke) Care giver support system in place?: Yes (comment)(daughter)   Criminal Activity/Legal  Involvement Pertinent to Current Situation/Hospitalization: No - Comment as needed  Activities of Daily Living Home Assistive Devices/Equipment: None ADL Screening (condition at time of admission) Patient's cognitive ability adequate to safely complete daily activities?: Yes Is the patient deaf or have difficulty hearing?: No Does the patient have difficulty seeing, even when wearing glasses/contacts?: No Does the patient have difficulty concentrating, remembering, or making decisions?: No Patient able to express need for assistance with ADLs?: Yes Does the patient have difficulty dressing or bathing?: Yes Independently performs ADLs?: No Communication: Needs assistance Is this a change from baseline?: Change from baseline, expected to last >3 days Dressing (OT): Needs assistance Is this a change from baseline?: Change from baseline, expected to last >3 days Grooming: Needs assistance Is this a change from baseline?: Change from baseline, expected to last >3 days Feeding: Needs assistance Is this a change from baseline?: Change from baseline, expected to last >3 days Bathing: Needs assistance Is this a change from baseline?: Change from baseline, expected to last >3 days Toileting: Needs assistance Is this a change from baseline?: Change from baseline, expected to last >3days In/Out Bed: Needs assistance Is this a change from baseline?: Change from baseline, expected to last >3 days Walks in Home: Needs assistance Is this a change from baseline?: Change from baseline, expected to last >3 days Does the patient have  difficulty walking or climbing stairs?: Yes Weakness of Legs: Both Weakness of Arms/Hands: Both  Permission Sought/Granted Permission sought to share information with : Case Manager, Customer service manager, Family Supports Permission granted to share information with : Yes, Verbal Permission Granted     Permission granted to share info w AGENCY: [place] granted to share info w Relationship: daughter and granddaughter     Emotional Assessment Appearance:: Appears stated age Attitude/Demeanor/Rapport: Unable to Assess Affect (typically observed): Accepting Orientation: : Oriented to Self Alcohol / Substance Use: Not Applicable Psych Involvement: No (comment)  Admission diagnosis:  Stroke ([place]) [I63.9] Hypoxia [R09.02] Stroke, acute, embolic ([place]) [Q25.9] Atrial flutter, unspecified type [facility]) [I48.92] Patient Active Problem List   Diagnosis Date Noted  . Stroke ([place]) 05/22/2019  . Persistent proteinuria 07/30/2018  . 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 Chronic kidney disease, stage III (moderate) ([place]) 06/15/2018  . Leg pain 05/06/2018  . Lymphedema 05/06/2018  . Coagulopathy ([place]) 07/07/2017  . Lumbar spondylosis 03/10/2017  . Elevated uric acid in blood 11/20/2016  . BPH (benign prostatic hyperplasia) 05/29/2016  . Peripheral vascular disease of lower extremity ([place]) 03/03/2016  . Gallstone 12/31/2015  . 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 Atherosclerosis of aorta ([place]) 11/22/2015  . Carotid artery narrowing 11/22/2015  . Diverticulosis of colon 11/22/2015  . Decreased creatinine clearance 11/22/2015  . Arthritis, degenerative 11/22/2015  . Lactose intolerance 11/22/2015  . Basal cell carcinoma 11/22/2015  . Essential hypertension 06/21/2015  . Hyperlipemia 06/21/2015  . GERD (gastroesophageal reflux disease) 06/21/2015  . Calculus of kidney 11/18/2013   PCP:  Steele Sizer, MD Pharmacy:   [place], [place] [address] Phone: [phone] Fax: [phone]     Social Determinants of Health (SDOH) Interventions    Readmission Risk Interventions No flowsheet data found.

## 2019-05-24 NOTE — Evaluation (Addendum)
Clinical/Bedside Swallow Evaluation Patient Details  Name: Jose Castro MRN: [id number] Date of Birth: [birth year]  Today's Date: 05/24/2019 Time: SLP Start Time (ACUTE ONLY): 0935 SLP Stop Time (ACUTE ONLY): 1030 SLP Time Calculation (min) (ACUTE ONLY): 55 min  Past Medical History:  Past Medical History:  Diagnosis Date  . Arthritis   . BPH (benign prostatic hyperplasia)   . Chronic kidney disease    had a kidney stone which per family was a cyst that was removed  . Dysrhythmia   . GERD (gastroesophageal reflux disease)   . History of kidney stones   . Hyperlipidemia   . Hypertension   . Splenic vein thrombosis   . Stroke ([name]) 05/22/2019  . Thrombosis 12/2015   mural  area and no notation of heart attack   Past Surgical History:  Past Surgical History:  Procedure Laterality Date  . APPENDECTOMY    . CATARACT EXTRACTION, BILATERAL    . CYSTOSCOPY WITH INSERTION OF UROLIFT    . KIDNEY STONE SURGERY    . KNEE SURGERY    . PACEMAKER INSERTION N/A 07/13/2018   Procedure: INSERTION PACEMAKER-DUEL CHAMBER INITIAL IMPLANT;  Surgeon: Isaias Cowman, MD;  Location: [facility] ORS;  Service: Cardiovascular;  Laterality: N/A;  . SPINE SURGERY     HPI:  Pt is a 85 y.o. male with a known history of hypertension, GERD, hyperlipidemia, pacemaker placement d/t bradycardia, and other multiple medical issues per chart notes.  Patient presented to the emergency room after being found on the floor of his home earlier in the day.  Patient was last seen well at 1 PM on yesterday.  EMS reported left fixed eye deviation to the right.  This has improved slightly by the time I am seeing the patient.  Patient's granddaughter is at the bedside.  Patient is awake, alert, oriented to person, place, and time.  However, he does not recall events surrounding the last 24 hours.  He is somewhat of a poor historian with slight confusion at times.  Today, pt is awake(had been min lethargic s/p admission), A/O x4. He  worked w/ OT during session following all commands. He appears to fatigue easily.  Pt appears min HOH.   Assessment / Plan / Recommendation Clinical Impression  Pt appears to present w/ inconsistent s/s of pharyngeal phase dysphagia at this evaluation today. Pt is A/O x4 and following all commands appropriately; min fatigued w/ OT/ST sessions by the end. Granddaughter noted pt cleared his throat/coughed intermittently at home when eating meals out - much before this event. Not positive that this could just be d/t advanced age and timing of the pharyngeal swallow; GERD baseline. But, pt(older-elderly male) is currently hospitalized w/ a new stroke and vascular congestion(per CXRs x2) from potential lengthy time down on floor b/f being found and brought in by EMS. Pt consumed po trials mostly feeding self given setup support dt/ LUE weakness. He demonstrated intermittent throat clearing x1, and mild cough x1, w/ ~4 ozs of thin liquid(water) via Cup. No decline in vocal quality or respiratory status noted during/post trials. Pt consumed ~4 ozs of Nectar consistency liquids w/ mild throat clear x1 post trials. No such issues noted w/ trials of puree and mech soft foods. Oral phase grossly [facility] for all bolus management, A-P transfer, and oral clearing. Slight residue noted at Left corner of lips which pt cleared w/ lingual sweep when cued. OM exam revealed a slight+ Left labial decreased tone/ROM; lingual strength and ROM/tone grossly WFL. Speech was  clear. Pt wears U/L Denture/partial. D/t pt's overall presentation and advanced age w/ current hospitalization, recommend initial diet of Mech Soft foods(easier to cut/eat d/t LUE weakness) w/ Nectar consistency liquids; aspiration precautions; Pills w/ puree or Nectar liquids. Recommend tray setup and support at meals d/t LUE weakness. Noted pt had a min R side preference, but no gross L side neglect w/ po tasks. ST services will f/u next 1-2 days w/ toleration of diet  and trials to upgrade as appropriate. NSG and family updated.  SLP Visit Diagnosis: Dysphagia, pharyngeal phase (R13.13)(possible)    Aspiration Risk  Mild aspiration risk(but reduced following general aspiration precautions, diet)    Diet Recommendation  Dysphagia level 3(mech soft foods easy to cut d/t LUE weakness); Nectar consistency liquids. General aspiration precautions. REFLUX precautions.  Medication Administration: Whole meds with puree(or w/ Nectar liquid)    Other  Recommendations Recommended Consults: (Dietician f/u as needed) Oral Care Recommendations: Oral care BID;Patient independent with oral care;Staff/trained caregiver to provide oral care Other Recommendations: Order thickener from pharmacy;Prohibited food (jello, ice cream, thin soups);Remove water pitcher;Have oral suction available   Follow up Recommendations (TBD)      Frequency and Duration min 3x week  2 weeks       Prognosis Prognosis for Safe Diet Advancement: Fair(-Good) Barriers to Reach Goals: (baseline status)      Swallow Study   General Date of Onset: 05/21/19 HPI: Pt is a 85 y.o. male with a known history of hypertension, GERD, hyperlipidemia, pacemaker placement d/t bradycardia, and other multiple medical issues per chart notes.  Patient presented to the emergency room after being found on the floor of his home earlier in the day.  Patient was last seen well at 1 PM on yesterday.  EMS reported left fixed eye deviation to the right.  This has improved slightly by the time I am seeing the patient.  Patient's granddaughter is at the bedside.  Patient is awake, alert, oriented to person, place, and time.  However, he does not recall events surrounding the last 24 hours.  He is somewhat of a poor historian with slight confusion at times.  Today, pt is awake(had been min lethargic s/p admission), A/O x4. He worked w/ OT during session following all commands. He appears to fatigue easily.  Pt appears min  HOH. Type of Study: Bedside Swallow Evaluation Previous Swallow Assessment: none reported Diet Prior to this Study: NPO Temperature Spikes Noted: No(wbc 14.8 declining from 25 at admit) Respiratory Status: Nasal cannula(4 liters) History of Recent Intubation: No Behavior/Cognition: Alert;Cooperative;Pleasant mood(min HOH) Oral Cavity Assessment: Dry(min) Oral Care Completed by SLP: Recent completion by staff Oral Cavity - Dentition: Dentures, top(partial bottom plate) Vision: Functional for self-feeding Self-Feeding Abilities: Able to feed self;Needs assist;Needs set up Patient Positioning: Upright in bed(needed min positioning d/t LUE weakness) Baseline Vocal Quality: Normal Volitional Cough: Strong Volitional Swallow: Able to elicit    Oral/Motor/Sensory Function Overall Oral Motor/Sensory Function: Mild impairment(slight) Facial ROM: Reduced left(slight+) Facial Symmetry: Abnormal symmetry left(slight+) Facial Strength: Reduced left(slight+) Facial Sensation: Reduced left(corner of mouth - slight) Lingual ROM: Within Functional Limits Lingual Symmetry: Within Functional Limits Lingual Strength: Within Functional Limits(grossly - slight give on Left lateral strength) Lingual Sensation: Within Functional Limits Velum: Within Functional Limits Mandible: Within Functional Limits   Ice Chips Ice chips: Within functional limits Presentation: Spoon(fed; 3 trials)   Thin Liquid Thin Liquid: Impaired(possibly new(?)) Presentation: Cup;Self Fed(~4ozs total) Oral Phase Impairments: (none) Oral Phase Functional Implications: (none) Pharyngeal  Phase  Impairments: Throat Clearing - Delayed(x2; w/ mild cough x1 - expectorated phlegm) Other Comments: Granddaughter noted pt cleared his throat/coughed at home when eating out intermittently - much before this event. Not positive that this could just be d/t advanced age and timing of the pharyngeal swallow. But pt is currently hospitalized w/ a  new stroke and congestion from potential lengthy time down on floor b/f being found.     Nectar Thick Nectar Thick Liquid: Within functional limits Presentation: Cup;Self Fed(4 ozs)   Honey Thick Honey Thick Liquid: Not tested   Puree Puree: Within functional limits Presentation: Self Fed;Spoon(4 ozs)   Solid     Solid: Within functional limits Presentation: Self Fed(6 trials; graham crackers w/ ice cream)       Orinda Kenner, MS, CCC-SLP Adriane Guglielmo 05/24/2019,1:47 PM

## 2019-05-24 NOTE — Progress Notes (Addendum)
[facility] NAME: Jose Castro    MR#:  [id number]  DATE OF BIRTH:  24-Apr-1926  SUBJECTIVE:   Chief Complaint  Patient presents with  . Fall   Patient is seen at the bedside. Granddaughter at the bedside.  He is stable week but able to follow commands without any difficulties. He is noted with moderate dysathria which seems to have improved. See detailed neuro exam. No new strokes or stroke like symptoms reported.  REVIEW OF SYSTEMS:  ROS Unable to obtain from patient due to moderate dysarthria DRUG ALLERGIES:   Allergies  Allergen Reactions  . Penicillins Itching and Swelling    Has patient had a PCN reaction causing immediate rash, facial/tongue/throat swelling, SOB or lightheadedness with hypotension: No Has patient had a PCN reaction causing severe rash involving mucus membranes or skin necrosis: No Has patient had a PCN reaction that required hospitalization: No Has patient had a PCN reaction occurring within the last 10 years: No If all of the above answers are "NO", then may proceed with Cephalosporin use.    VITALS:  Blood pressure 136/60, pulse (!) 59, temperature (!) 97.5 F (36.4 C), temperature source Oral, resp. rate 18, height 5\' 10"  (1.778 m), weight 82.6 kg, SpO2 96 %. PHYSICAL EXAMINATION:   GENERAL:  85 y.o.-year-old patient lying in the bed with no acute distress.  EYES: Pupils equal, round, reactive to light and accommodation. No scleral icterus. Extraocular muscles intact.  HEENT: Head atraumatic, normocephalic. Oropharynx and nasopharynx clear.  NECK:  Supple, no jugular venous distention. No thyroid enlargement, no tenderness.  LUNGS: Normal breath sounds bilaterally, no wheezing, rales,rhonchi or crepitation. No use of accessory muscles of respiration.  CARDIOVASCULAR: S1, S2 normal. No murmurs, rubs, or gallops.  ABDOMEN: Soft, nontender, nondistended. Bowel sounds present. No organomegaly or mass.   EXTREMITIES: No pedal edema, cyanosis, or clubbing. No rash or lesions. + pedal pulses MUSCULOSKELETAL: Normal bulk, and power was 5+ grip and elbow, knee, and ankle flexion and extension bilaterally.  NEUROLOGIC: Patient awake, oriented x 3. Follows commands. Eye opening apraxia but much improved. Extraocular movements intact. Left homonomyous hemianopia. Left facial weakness. Dysarthria. No aphasia. Left upper extremity with 4/5 strength. Left lower extremity with 4/5 strength. Moves right side without difficulty, strength normal. Left upper extremity dysmetric. Plantars up on the right, down on the left. Sensation intact bilaterally. Gait not tested due to safety concern. PSYCHIATRIC: The patient is alert and oriented x 3.  SKIN: No obvious rash, lesion, or ulcer.   DATA REVIEWED:  LABORATORY PANEL:  Male CBC Recent Labs  Lab 05/24/19 0434  WBC 14.8*  HGB 16.7  HCT 50.4  PLT 251   ------------------------------------------------------------------------------------------------------------------ Chemistries  Recent Labs  Lab 05/23/19 1253 05/24/19 0434  NA 141 143  K 3.5 3.5  CL 103 103  CO2 26 25  GLUCOSE 121* 117*  BUN 42* 51*  CREATININE 1.35* 1.25*  CALCIUM 8.5* 8.6*  AST 20  --   ALT 17  --   ALKPHOS 62  --   BILITOT 2.1*  --    RADIOLOGY:  Ct Head Wo Contrast  Result Date: 05/23/2019 CLINICAL DATA:  Stroke. EXAM: CT HEAD WITHOUT CONTRAST TECHNIQUE: Contiguous axial images were obtained from the base of the skull through the vertex without intravenous contrast. COMPARISON:  CT head 05/21/2019 FINDINGS: Brain: Acute right MCA infarct. Progressive low-density in the right anterior temporal lobe and right posterior temporal lobe compared to  the prior study compatible with progressive edema from infarction. There is some sparing of the central portion of the temporal lobe and sparing of the insula. Hypodensity in the posterior right external capsule is larger and lower  density compared to the prior study compatible with acute infarction. No associated hemorrhage. Minimal midline shift to the left due to edema. Ventricle size normal. Vascular: Negative for hyperdense vessel Skull: Negative Sinuses/Orbits: Paranasal sinuses clear. Bilateral cataract surgery. Other: None IMPRESSION: Acute right MCA infarct. Progressive cytotoxic edema in the right anterior and posterior temporal lobe and in the posterior limb internal capsule on the right. No associated hemorrhage. Mild midline shift to the left due to edema. Electronically Signed   By: Franchot Gallo M.D.   On: 05/23/2019 14:59   Dg Chest Port 1 View  Result Date: 05/22/2019 CLINICAL DATA:  Shortness of breath. EXAM: PORTABLE CHEST 1 VIEW COMPARISON:  Radiograph yesterday. Chest CT 12/14/2015 FINDINGS: Left-sided pacemaker remains in place. Normal heart size and mediastinal contours with aortic atherosclerosis. Unchanged vascular congestion. No acute airspace disease. No pleural fluid or pneumothorax. IMPRESSION: Unchanged vascular congestion. No new abnormality. Electronically Signed   By: Keith Rake M.D.   On: 05/22/2019 23:31   ASSESSMENT AND PLAN:   85 y.o. 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  Patient was found to have right ICA and right MCA territory acute infarction.  1. Acute right MCA stroke -clinical presentation suggestive of LVO likely cardioembolic in a patient with new onset afib? sick sinus s/p pacemaker - Unable to obtain MRI brain due to pacemaker - Repeat CT head showed evolution of right MCA stroke with edema - Hold Eliquis, restart in 3 days  - Continue Aspirin 81 mg/day  - Rosuvastatin to keep low density lipoprotein (LDL) <70 mg/dl - Telemetry monitoring - Neurochecks and VS per stroke  protocol - PT/OT/Speech following, cleared for a diet - Neurology input appreciated - HTN,HLD, AFIB risk factor management as below  2. New onset atrial fibrillation -rate controlled - Holding Eliquis due to risk of hemorrhagic transformation in large territory stroke - Aspirin as above  3. Aspiration pneumonia - Continue Clindamycin IV - Repeat chest xray today - Aspiration precautions  4. Elevated troponin-likely demand supply ischemia in the setting of acute stroke  5. Hypertension - Allow permissive hypertension in the setting of stroke. Will treat if SBP >160  6. CKD - Continue to monitor renal functions.  All the records are reviewed and case discussed with Care Management/Social Worker. Management plans discussed with the patient, family and they are in agreement.  CODE STATUS: Full Code  TOTAL TIME TAKING CARE OF THIS PATIENT: 35 minutes.   More than 50% of the time was spent in counseling/coordination of care: YES  POSSIBLE D/C IN 2 DAYS, DEPENDING ON CLINICAL CONDITION.   on 05/24/2019 at 1:22 PM  This patient was staffed with Dr. Manuella Ghazi, [place] who personally evaluated patient, reviewed documentation and agreed with assessment and plan of care as above.  Rufina Falco, DNP, FNP-BC [facility] Nurse Practitioner   Between 7am to 6pm - Pager [phone]  After 6pm go to [email or web address] - Technical brewer [facility]  Office  [phone]  CC: Primary care physician; Steele Sizer, MD  Note: This dictation was prepared with Dragon dictation along with smaller phrase technology. Any  transcriptional errors that result from this process are unintentional.

## 2019-05-24 NOTE — Progress Notes (Signed)
Subjective: L side weakness is persistent and periods of confusion.  CTH done which showed evolution of a stroke.   Past Medical History:  Diagnosis Date  . Arthritis   . BPH (benign prostatic hyperplasia)   . Chronic kidney disease    had a kidney stone which per family was a cyst that was removed  . Dysrhythmia   . GERD (gastroesophageal reflux disease)   . History of kidney stones   . Hyperlipidemia   . Hypertension   . Splenic vein thrombosis   . Stroke ([place]) 05/22/2019  . Thrombosis 12/2015   mural  area and no notation of heart attack    Past Surgical History:  Procedure Laterality Date  . APPENDECTOMY    . CATARACT EXTRACTION, BILATERAL    . CYSTOSCOPY WITH INSERTION OF UROLIFT    . KIDNEY STONE SURGERY    . KNEE SURGERY    . PACEMAKER INSERTION N/A 07/13/2018   Procedure: INSERTION PACEMAKER-DUEL CHAMBER INITIAL IMPLANT;  Surgeon: Isaias Cowman, MD;  Location: [facility] ORS;  Service: Cardiovascular;  Laterality: N/A;  . SPINE SURGERY      Family History  Problem Relation Age of Onset  . Cancer Brother        bladder cancer with mets  . Heart disease Mother   . Aortic aneurysm Mother   . Heart attack Maternal Aunt   . Heart attack Maternal Uncle    Social History:  reports that he quit smoking about 58 years ago. His smoking use included cigarettes. He started smoking about 74 years ago. He quit after 16.00 years of use. He has never used smokeless tobacco. He reports current alcohol use of about 3.0 standard drinks of alcohol per week. He reports that he does not use drugs.  Allergies:  Allergies  Allergen Reactions  . Penicillins Itching and Swelling    Has patient had a PCN reaction causing immediate rash, facial/tongue/throat swelling, SOB or lightheadedness with hypotension: No Has patient had a PCN reaction causing severe rash involving mucus membranes or skin necrosis: No Has patient had a PCN reaction that required hospitalization: No Has patient  had a PCN reaction occurring within the last 10 years: No If all of the above answers are "NO", then may proceed with Cephalosporin use.     Medications Prior to Admission  Medication Sig Dispense Refill  . Cholecalciferol (VITAMIN D3) 2000 units TABS Take 2,000 Units by mouth daily.     Marland Kitchen CIALIS 5 MG tablet Take 5 mg by mouth daily as needed for erectile dysfunction.     . finasteride (PROSCAR) 5 MG tablet Take 5 mg by mouth daily.     Marland Kitchen lisinopril (PRINIVIL,ZESTRIL) 20 MG tablet Take 1 tablet by mouth daily.    . Omega-3 Fatty Acids (FISH OIL) 1000 MG CAPS Take 1,000 mg by mouth daily.     . pantoprazole (PROTONIX) 40 MG tablet TAKE ONE TABLET EVERY DAY 90 tablet 1    ROS: As per HPI  Physical Examination: Blood pressure 136/60, pulse (!) 59, temperature (!) 97.5 F (36.4 C), temperature source Oral, resp. rate 18, height 5\' 10"  (1.778 m), weight 82.6 kg, SpO2 96 %.  HEENT-  Normocephalic, no lesions, without obvious abnormality.  Normal external eye and conjunctiva.  Normal TM's bilaterally.  Normal auditory canals and external ears. Normal external nose, mucus membranes and septum.  Normal pharynx. Neck supple with no masses, nodes, nodules or enlargement. Cardiovascular - regular rate and rhythm Lungs - chest clear, no  wheezing, rales, normal symmetric air entry, Heart exam - S1, S2 normal, no murmur, no gallop, rate regular Abdomen - soft, non-tender; bowel sounds normal; no masses,  no organomegaly Extremities - no edema  Neurologic Examination: Patient is alert, awake, oriented x3,  Dysarthric, follows commands. NO neglect noted CN: PERRLA, right gaze deviation crossing midline, questionable right hemianopsia, left facial, face sensation to touch is nle, palate and tongue midilne Motor:  Drift on the LUEX/LLEX otherwise strenght id 4/5 IN LUEX/LLEX 5/5 on RUEX/RLEX No sensory deficit appreciated Dysmetria on FN on the left and knee to heel on the left DTR not  checked Gait not checked   Results for orders placed or performed during the hospital encounter of 05/21/19 (from the past 48 hour(s))  Blood gas, arterial     Status: Abnormal   Collection Time: 05/23/19  5:32 AM  Result Value Ref Range   FIO2 0.44    Delivery systems Accord 6 LPM    pH, Arterial 7.47 (H) 7.350 - 7.450   pCO2 arterial 31 (L) 32.0 - 48.0 mmHg   pO2, Arterial 57 (L) 83.0 - 108.0 mmHg   Bicarbonate 22.6 20.0 - 28.0 mmol/L   Acid-base deficit 0.2 0.0 - 2.0 mmol/L   O2 Saturation 91.1 %   Patient temperature 37.0    Collection site RIGHT RADIAL    Sample type ARTERIAL DRAW    Allens test (pass/fail) PASS PASS    Comment: Performed at [facility], [address] [place], [place] [address]  CBC with Differential/Platelet     Status: Abnormal   Collection Time: 05/23/19 12:53 PM  Result Value Ref Range   WBC 19.0 (H) 4.0 - 10.5 K/uL   RBC 5.59 4.22 - 5.81 MIL/uL   Hemoglobin 16.6 13.0 - 17.0 g/dL   HCT 49.6 39.0 - 52.0 %   MCV 88.7 80.0 - 100.0 fL   MCH 29.7 26.0 - 34.0 pg   MCHC 33.5 30.0 - 36.0 g/dL   RDW 14.5 11.5 - 15.5 %   Platelets 270 150 - 400 K/uL   nRBC 0.0 0.0 - 0.2 %   Neutrophils Relative % 80 %   Neutro Abs 15.3 (H) 1.7 - 7.7 K/uL   Lymphocytes Relative 9 %   Lymphs Abs 1.7 0.7 - 4.0 K/uL   Monocytes Relative 10 %   Monocytes Absolute 1.8 (H) 0.1 - 1.0 K/uL   Eosinophils Relative 1 %   Eosinophils Absolute 0.1 0.0 - 0.5 K/uL   Basophils Relative 0 %   Basophils Absolute 0.0 0.0 - 0.1 K/uL   Immature Granulocytes 0 %   Abs Immature Granulocytes 0.08 (H) 0.00 - 0.07 K/uL    Comment: Performed at [facility], [address] [place], [place] [address]  Comprehensive metabolic panel     Status: Abnormal   Collection Time: 05/23/19 12:53 PM  Result Value Ref Range   Sodium 141 135 - 145 mmol/L   Potassium 3.5 3.5 - 5.1 mmol/L   Chloride 103 98 - 111 mmol/L   CO2 26 22 - 32 mmol/L   Glucose, Bld 121 (H) 70 - 99 mg/dL   BUN 42  (H) 8 - 23 mg/dL   Creatinine, Ser 1.35 (H) 0.61 - 1.24 mg/dL   Calcium 8.5 (L) 8.9 - 10.3 mg/dL   Total Protein 7.1 6.5 - 8.1 g/dL   Albumin 3.6 3.5 - 5.0 g/dL   AST 20 15 - 41 U/L   ALT 17 0 - 44  U/L   Alkaline Phosphatase 62 38 - 126 U/L   Total Bilirubin 2.1 (H) 0.3 - 1.2 mg/dL   GFR calc non Af Amer 46 (L) >60 mL/min   GFR calc Af Amer 53 (L) >60 mL/min   Anion gap 12 5 - 15    Comment: Performed at [facility], [address] [place], [place] [address]  CBC with Differential/Platelet     Status: Abnormal   Collection Time: 05/24/19  4:34 AM  Result Value Ref Range   WBC 14.8 (H) 4.0 - 10.5 K/uL   RBC 5.60 4.22 - 5.81 MIL/uL   Hemoglobin 16.7 13.0 - 17.0 g/dL   HCT 50.4 39.0 - 52.0 %   MCV 90.0 80.0 - 100.0 fL   MCH 29.8 26.0 - 34.0 pg   MCHC 33.1 30.0 - 36.0 g/dL   RDW 14.4 11.5 - 15.5 %   Platelets 251 150 - 400 K/uL   nRBC 0.0 0.0 - 0.2 %   Neutrophils Relative % 79 %   Neutro Abs 11.8 (H) 1.7 - 7.7 K/uL   Lymphocytes Relative 9 %   Lymphs Abs 1.3 0.7 - 4.0 K/uL   Monocytes Relative 11 %   Monocytes Absolute 1.6 (H) 0.1 - 1.0 K/uL   Eosinophils Relative 0 %   Eosinophils Absolute 0.0 0.0 - 0.5 K/uL   Basophils Relative 0 %   Basophils Absolute 0.1 0.0 - 0.1 K/uL   Immature Granulocytes 1 %   Abs Immature Granulocytes 0.07 0.00 - 0.07 K/uL    Comment: Performed at [facility], [address] [place], [place] [address]  Basic metabolic panel     Status: Abnormal   Collection Time: 05/24/19  4:34 AM  Result Value Ref Range   Sodium 143 135 - 145 mmol/L   Potassium 3.5 3.5 - 5.1 mmol/L   Chloride 103 98 - 111 mmol/L   CO2 25 22 - 32 mmol/L   Glucose, Bld 117 (H) 70 - 99 mg/dL   BUN 51 (H) 8 - 23 mg/dL   Creatinine, Ser 1.25 (H) 0.61 - 1.24 mg/dL   Calcium 8.6 (L) 8.9 - 10.3 mg/dL   GFR calc non Af Amer 50 (L) >60 mL/min   GFR calc Af Amer 58 (L) >60 mL/min   Anion gap 15 5 - 15    Comment: Performed at [facility], [address], [place], [place] [address]   Ct Head Wo Contrast  Result Date: 05/23/2019 CLINICAL DATA:  Stroke. EXAM: CT HEAD WITHOUT CONTRAST TECHNIQUE: Contiguous axial images were obtained from the base of the skull through the vertex without intravenous contrast. COMPARISON:  CT head 05/21/2019 FINDINGS: Brain: Acute right MCA infarct. Progressive low-density in the right anterior temporal lobe and right posterior temporal lobe compared to the prior study compatible with progressive edema from infarction. There is some sparing of the central portion of the temporal lobe and sparing of the insula. Hypodensity in the posterior right external capsule is larger and lower density compared to the prior study compatible with acute infarction. No associated hemorrhage. Minimal midline shift to the left due to edema. Ventricle size normal. Vascular: Negative for hyperdense vessel Skull: Negative Sinuses/Orbits: Paranasal sinuses clear. Bilateral cataract surgery. Other: None IMPRESSION: Acute right MCA infarct. Progressive cytotoxic edema in the right anterior and posterior temporal lobe and in the posterior limb internal capsule on the right. No associated hemorrhage. Mild midline shift to the left due to edema. Electronically Signed  By: Franchot Gallo M.D.   On: 05/23/2019 14:59   Dg Chest Port 1 View  Result Date: 05/22/2019 CLINICAL DATA:  Shortness of breath. EXAM: PORTABLE CHEST 1 VIEW COMPARISON:  Radiograph yesterday. Chest CT 12/14/2015 FINDINGS: Left-sided pacemaker remains in place. Normal heart size and mediastinal contours with aortic atherosclerosis. Unchanged vascular congestion. No acute airspace disease. No pleural fluid or pneumothorax. IMPRESSION: Unchanged vascular congestion. No new abnormality. Electronically Signed   By: Keith Rake M.D.   On: 05/22/2019 23:31    Assessment:   85 y.o. male with a known history of hypertension, GERD, hyperlipidemia, cataract surgery on both eyes,  pacemaker placement for what seems to be sick sinus syndrome admitted with a left sided weakness in whom head ct showing hyperdense MCA and acute infarction oo right MCA territory with evolution on CTH done yesterday.   - Unable to obtain MRI due to PPM - needs pt/ot  - will need  eliquis for A-flutter/stroke prevention - For now ASA daily/rectal until passes swallow eval - eliquis on day 5 post admission as good size stroke ( in 3 days) - Will likely need placement to SNIFF/rehab

## 2019-05-24 NOTE — Evaluation (Addendum)
Occupational Therapy Evaluation Patient Details Name: Jose Castro MRN: [id number] DOB: Mar 31, 1926 Today's Date: 05/24/2019    History of Present Illness 85 y.o. 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  Patient was found to have right ICA and right MCA territory acute infarction.   Clinical Impression   Pt seen for OT evaluation this date. 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 Pt denies additional falls history in past 12 months. Pt lives by himself in a 2 story home (1/2 bath on main floor, bed and full back on 2nd floor). Pt demonstrates impairments in sitting and standing balance, activity tolerance, cardiopulmonary status requiring 5L O2 (no home O2 at baseline), pain in R great toe and L heel, mild strength deficits L>R, and HOH. Pt denies and no sensory, coordination, cognitive, or visual deficits appreciated with assessment. Pt able to follow commands well. Requires Mod A x1 sup>sit, Min-Mod A x2 for sit>sup. Mod A to stand EOB with cues for RW placement. L lateral lean noted with sitting requiring cues and occasional Min A to correct. Slight posterior lean in standing, improving with cues and time. Given impairments, pt now requires Min-Mod A for LB ADL. HR and O2 sats WNL during session. Pt will benefit from skilled OT services to maximize return to PLOF. Based on pt's high level of independence prior to admission and good family support, recommend CIR at this time.     Follow Up Recommendations  CIR    Equipment Recommendations  3 in 1 bedside commode;Tub/shower seat    Recommendations for Other Services       Precautions / Restrictions Precautions Precautions: Fall;Other (comment) Precaution Comments:  monitor HR and O2 Restrictions Weight Bearing Restrictions: No      Mobility Bed Mobility Overal bed mobility: Needs Assistance Bed Mobility: Supine to Sit;Sit to Supine     Supine to sit: Mod assist;HOB elevated Sit to supine: Min assist;+2 for physical assistance      Transfers Overall transfer level: Needs assistance Equipment used: Rolling walker (2 wheeled) Transfers: Sit to/from Stand Sit to Stand: Mod assist         General transfer comment: cues for hand placement    Balance Overall balance assessment: Needs assistance Sitting-balance support: Single extremity supported;Bilateral upper extremity supported;Feet supported Sitting balance-Leahy Scale: Poor Sitting balance - Comments: verbal cues up to Min A to correct for occasional L lateral lean in sitting Postural control: Left lateral lean;Posterior lean   Standing balance-Leahy Scale: Poor Standing balance comment: requires BUE support on RW, slight posterior lean requiring initial Min A to correct, improving to CGA with cues and weight shift anteriorly                           ADL either performed or assessed with clinical judgement   ADL Overall ADL's : Needs assistance/impaired Eating/Feeding: NPO Eating/Feeding Details (indicate cue type and reason): SLP in at end of session to further assess, but pt does not appear to have difficulty with [facility] tasks     Upper Body Bathing: Sitting;Minimal assistance   Lower Body Bathing: Sit to/from stand;Minimal assistance;Moderate assistance Lower Body Bathing Details (indicate cue type and reason): increased assist required once in standing 2/2 decreased balance Upper Body Dressing :  Sitting;Minimal assistance   Lower Body Dressing: Sit to/from stand;Minimal assistance;Moderate assistance Lower Body Dressing Details (indicate cue type and reason): increased assist required once in standing 2/2 decreased balance Toilet Transfer: BSC;RW;Minimal  assistance;Cueing for safety;Ambulation Toilet Transfer Details (indicate cue type and reason): posterior lean                 Vision Baseline Vision/History: Wears glasses Wears Glasses: At all times Patient Visual Report: No change from baseline Vision Assessment?: No apparent visual deficits     Perception     Praxis      Pertinent Vitals/Pain Pain Assessment: (pt reports "medium" amount of pain in R great toe and L heel (R toe pain increases in standing, heel pain constant), describes as achy, dull)     Hand Dominance Right   Extremity/Trunk Assessment Upper Extremity Assessment Upper Extremity Assessment: LUE deficits/detail;RUE deficits/detail RUE Deficits / Details: grossly 5/5 RUE Sensation: WNL RUE Coordination: WNL LUE Deficits / Details: grossly 4+/5 LUE Sensation: WNL LUE Coordination: WNL   Lower Extremity Assessment Lower Extremity Assessment: Defer to PT evaluation;RLE deficits/detail;LLE deficits/detail RLE Deficits / Details: grossly 4+/5 RLE Sensation: WNL RLE Coordination: WNL LLE Deficits / Details: grossly 4/5 LLE Sensation: WNL LLE Coordination: WNL   Cervical / Trunk Assessment Cervical / Trunk Assessment: Normal   Communication Communication Communication: HOH   Cognition Arousal/Alertness: Awake/alert Behavior During Therapy: WFL for tasks assessed/performed Overall Cognitive Status: Within Functional Limits for tasks assessed                                 General Comments: Alert and oriented, follows commands, slightly increased response time (but also [facility])   General Comments       Exercises Other Exercises Other Exercises: functional transfer training and static/dynamic balance training   Shoulder Instructions      Home Living Family/patient expects to be discharged to:: Private residence Living Arrangements: Alone Available Help at Discharge: Family;Available 24 hours/day(children and granddaughter (who  is an ICU RN) can assist) Type of Home: House Home Access: Stairs to enter Entrance Stairs-Number of Steps: 2 steps in back Entrance Stairs-Rails: None Home Layout: Two level;1/2 bath on main level;Bed/bath upstairs     Bathroom Shower/Tub: Walk-in shower;Door   [employer] Toilet: Standard     Home Equipment: None          Prior Functioning/Environment Level of Independence: Independent        Comments: Pt indep in all aspects, driving, and no additional falls.        OT Problem List: Decreased strength;Pain;Decreased activity tolerance;Impaired balance (sitting and/or standing)      OT Treatment/Interventions: Self-care/ADL training;Therapeutic exercise;Therapeutic activities;Neuromuscular education;Patient/family education;DME and/or AE instruction;Balance training    OT Goals(Current goals can be found in the care plan section) Acute Rehab OT Goals Patient Stated Goal: to get stronger and return home OT Goal Formulation: With patient/family Time For Goal Achievement: 06/07/19 Potential to Achieve Goals: Good ADL Goals Pt Will Perform Upper Body Dressing: sitting;with supervision;with set-up(with fair+ sitting balance) Pt Will Perform Lower Body Dressing: with min assist;sit to/from stand Pt Will Transfer to Toilet: with min assist;ambulating;bedside commode(LRAD for amb)  OT Frequency: Min 3X/week   Barriers to D/C:            Co-evaluation              AM-PAC OT "6 Clicks" Daily Activity     Outcome Measure  Help from another person eating meals?: None Help from another person taking care of personal grooming?: None Help from another person toileting, which includes using toliet, bedpan, or urinal?: A Little Help from another person bathing (including washing, rinsing, drying)?: A Lot Help from another person to put on and taking off regular upper body clothing?: A Little Help from another person to put on and taking off regular lower body clothing?: A  Lot 6 Click Score: 18   End of Session Equipment Utilized During Treatment: Gait belt;Rolling walker;Oxygen  Activity Tolerance: Patient tolerated treatment well Patient left: in bed;with call bell/phone within reach;with bed alarm set;with family/visitor present;Other (comment)(granddaughter and SLP in room)  OT Visit Diagnosis: Other abnormalities of gait and mobility (R26.89);Pain Pain - Right/Left: Right(both) Pain - part of body: Ankle and joints of foot                Time: 4174-0814 OT Time Calculation (min): 32 min Charges:  OT General Charges $OT Visit: 1 Visit OT Evaluation $OT Eval Moderate Complexity: 1 Mod OT Treatments $Therapeutic Activity: 8-22 mins  Jeni Salles, MPH, MS, OTR/L ascom [phone] 05/24/19, 10:27 AM

## 2019-05-24 NOTE — Consult Note (Signed)
Consultation Note Date: 05/24/2019   Patient Name: Jose Castro  DOB: [birth year]  MRN: [id number]  Age / Sex: 85 y.o., male  PCP: Steele Sizer, MD Referring Physician: Lang Snow,*  Reason for Consultation: Establishing goals of care  HPI/Patient Profile: 85 y.o. male  with past medical history of high blood pressure and cholesterol, sick sinus syndrome with pacemaker August 2019, A. fib, CKD, BPH, arthritis admitted on 05/21/2019 with left-sided weakness, evolution of right MCA stroke with edema/midline shift.  Does not appear as acutely ill as study suggest.  Clinical Assessment and Goals of Care: Mr. Kincaid is lying quietly in bed with nursing staff at bedside.  He seems to have some left neglect but will say hello and wave at me.  He seems to have a wet productive cough.  No complaints of pain at this time.  Granddaughter Basilia Jumbo, RN is in the waiting area.  We have a discussion related to Mr. Yasin's acute and chronic health concerns.  He has limited chronic disease burden.  He has excellent functional status including independent with IADLs even driving to [place] at times.  I shared that these are good indicators of positive outcomes.  We review labs and images in detail.    Granddaughter Jaclyn Shaggy is realistic but optimistic for some recovery.  Patient and family is requesting rehab at [facility]  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  Jaclyn Shaggy tells me that Mr. Toft Medtronic pacer is MRI compatible, sharing that if Mr. Simmer needs an MRI there should not be a problem.  PMT to follow-up on Thursday.  HCPOA   NEXT OF KIN -Mr. Seyfried has 6 children, 3 boys and 3 girls.  Family makes choices together.  Initially daughter [name] was primary decision maker as she went to most of his doctors appointments.  Granddaughter Basilia Jumbo, RN, has taken over  as family representative by consensus.  SUMMARY OF RECOMMENDATIONS   Continue to treat the treatable Rehab at [facility] if possible Continue CODE STATUS discussions   Code Status/Advance Care Planning:  Full code -we discussed the concept of "treat the treatable but allowing natural death".  Jaclyn Shaggy shares that she understands that her grandfather could suddenly change.  She shares that she feels that CPR would not likely be beneficial or effective for Mr. Rusher at his age, but family is, "not there yet".  Symptom Management:   Per hospitalist, no additional needs at this time.  Palliative Prophylaxis:   Frequent Pain Assessment  Additional Recommendations (Limitations, Scope, Preferences):  Full Scope Treatment  Psycho-social/Spiritual:   Desire for further Chaplaincy support:no  Additional Recommendations: Caregiving  Support/Resources and Education on Hospice  Prognosis:   Unable to determine  Discharge Planning: Requesting rehab at [facility]      Primary Diagnoses: Present on Admission: . Stroke [facility])   I have reviewed the medical record, interviewed the patient and family, and examined the patient. The following aspects are pertinent.  Past Medical  History:  Diagnosis Date  . Arthritis   . BPH (benign prostatic hyperplasia)   . Chronic kidney disease    had a kidney stone which per family was a cyst that was removed  . Dysrhythmia   . GERD (gastroesophageal reflux disease)   . History of kidney stones   . Hyperlipidemia   . Hypertension   . Splenic vein thrombosis   . Stroke ([place]) 05/22/2019  . Thrombosis 12/2015   mural  area and no notation of heart attack   Social History   Socioeconomic History  . Marital status: Widowed    Spouse name: Not on file  . Number of children: 6  . Years of education: Not on file  . Highest education level: Some college, no degree  Occupational History  . Occupation: retired    Fish farm manager: [employer]    Comment:  worked at a shop  Social Needs  . Financial resource strain: Not hard at all  . Food insecurity    Worry: Never true    Inability: Never true  . Transportation needs    Medical: No    Non-medical: No  Tobacco Use  . Smoking status: Former Smoker    Years: 16.00    Types: Cigarettes    Start date: 10/21/1944    Quit date: 10/21/1960    Years since quitting: 58.6  . Smokeless tobacco: Never Used  Substance and Sexual Activity  . Alcohol use: Yes    Alcohol/week: 3.0 standard drinks    Types: 3 Standard drinks or equivalent per week    Comment: once a week when he goes out to dinner-socially  . Drug use: No  . Sexual activity: Yes    Partners: Female  Lifestyle  . Physical activity    Days per week: 0 days    Minutes per session: 0 min  . Stress: Not at all  Relationships  . Social connections    Talks on phone: More than three times a week    Gets together: Twice a week    Attends religious service: More than 4 times per year    Active member of club or organization: Yes    Attends meetings of clubs or organizations: More than 4 times per year    Relationship status: Widowed  Other Topics Concern  . Not on file  Social History Narrative   Widow, has a good networks of friends and family    Family History  Problem Relation Age of Onset  . Cancer Brother        bladder cancer with mets  . Heart disease Mother   . Aortic aneurysm Mother   . Heart attack Maternal Aunt   . Heart attack Maternal Uncle    Scheduled Meds: .  stroke: mapping our early stages of recovery book   Does not apply Once  . aspirin  300 mg Rectal Daily   Or  . aspirin EC  325 mg Oral Daily  . furosemide  40 mg Intravenous Daily  . pantoprazole (PROTONIX) IV  40 mg Intravenous Q24H  . rosuvastatin  40 mg Oral q1800   Continuous Infusions: . sodium chloride 50 mL/hr at 05/22/19 0451  . clindamycin (CLEOCIN) IV 600 mg (05/24/19 0621)   PRN Meds:.acetaminophen **OR** acetaminophen (TYLENOL)  oral liquid 160 mg/5 mL **OR** acetaminophen Medications Prior to Admission:  Prior to Admission medications   Medication Sig Start Date End Date Taking? Authorizing Provider  Cholecalciferol (VITAMIN D3) 2000 units TABS Take 2,000 Units  by mouth daily.    Yes [provider]  CIALIS 5 MG tablet Take 5 mg by mouth daily as needed for erectile dysfunction.  06/11/15  Yes [provider]  finasteride (PROSCAR) 5 MG tablet Take 5 mg by mouth daily.  06/11/15  Yes [provider]  lisinopril (PRINIVIL,ZESTRIL) 20 MG tablet Take 1 tablet by mouth daily. 01/28/19  Yes [provider]  Omega-3 Fatty Acids (FISH OIL) 1000 MG CAPS Take 1,000 mg by mouth daily.    Yes [provider]  pantoprazole (PROTONIX) 40 MG tablet TAKE ONE TABLET EVERY DAY 02/15/19  Yes Steele Sizer, MD   Allergies  Allergen Reactions  . Penicillins Itching and Swelling    Has patient had a PCN reaction causing immediate rash, facial/tongue/throat swelling, SOB or lightheadedness with hypotension: No Has patient had a PCN reaction causing severe rash involving mucus membranes or skin necrosis: No Has patient had a PCN reaction that required hospitalization: No Has patient had a PCN reaction occurring within the last 10 years: No If all of the above answers are "NO", then may proceed with Cephalosporin use.    Review of Systems  Unable to perform ROS: Age    Physical Exam Vitals signs and nursing note reviewed.  Constitutional:      General: He is not in acute distress.    Comments: Will make an somewhat keep eye contact, some left neglect  HENT:     Head: Atraumatic.  Cardiovascular:     Rate and Rhythm: Normal rate.  Pulmonary:     Effort: Pulmonary effort is normal. No respiratory distress.  Abdominal:     General: Abdomen is flat. There is no distension.  Musculoskeletal:        General: No swelling.  Skin:    General: Skin is warm and dry.  Neurological:      Mental Status: He is alert.     Comments: Orientation questions not asked, known acute stroke  Psychiatric:     Comments: Calm and cooperative, not fearful     Vital Signs: BP 136/60 (BP Location: Left Arm)   Pulse (!) 59   Temp (!) 97.5 F (36.4 C) (Oral)   Resp 18   Ht 5\' 10"  (1.778 m)   Wt 82.6 kg   SpO2 96%   BMI 26.11 kg/m  Pain Scale: 0-10   Pain Score: Asleep   SpO2: SpO2: 96 % O2 Device:SpO2: 96 % O2 Flow Rate: .O2 Flow Rate (L/min): 4 L/min  IO: Intake/output summary: No intake or output data in the 24 hours ending 05/24/19 1322  LBM: Last BM Date: 05/21/19(per chart) Baseline Weight: Weight: 82.6 kg Most recent weight: Weight: 82.6 kg     Palliative Assessment/Data:   Flowsheet Rows     Most Recent Value  Intake Tab  Referral Department  Hospitalist  Unit at Time of Referral  Med/Surg Unit  Palliative Care Primary Diagnosis  Neurology  Date Notified  05/24/19  Palliative Care Type  New Palliative care  Reason for referral  Clarify Goals of Care  Date of Admission  05/21/19  Date first seen by Palliative Care  05/24/19  # of days Palliative referral response time  0 Day(s)  # of days IP prior to Palliative referral  3  Clinical Assessment  Palliative Performance Scale Score  60%  Psychosocial & Spiritual Assessment  Palliative Care Outcomes      Time In: 1310 Time Out: 1420 Time Total: 70 minutes Greater than  50%  of this time was spent counseling and coordinating care related to the above assessment and plan.  Signed by: Drue Novel, NP   Please contact Palliative Medicine Team phone at [phone] for questions and concerns.  For individual provider: See Shea Evans

## 2019-05-24 NOTE — Progress Notes (Signed)
Granddaughter requested for patient to rest, will attempt to cluster care.Madlyn Frankel, RN

## 2019-05-24 NOTE — NC FL2 (Signed)
[place] LEVEL OF CARE SCREENING TOOL     IDENTIFICATION  Patient Name: Jose Castro Birthdate: 1926/02/20 Sex: male Admission Date (Current Location): 05/21/2019  [place] and [place] Number:  Engineering geologist and Address:  [facility], [address] [place], [place] [address]      Provider Number: [id number]  Attending Physician Name and Address:  Lang Snow,*  Relative Name and Phone Number:  Renata Caprice [phone]    Current Level of Care: Hospital Recommended Level of Care: [place] Prior Approval Number:    Date Approved/Denied:   PASRR Number: [id number]  Discharge Plan: SNF    Current Diagnoses: Patient Active Problem List   Diagnosis Date Noted  . Stroke ([place]) 05/22/2019  . Persistent proteinuria 07/30/2018  . 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 Chronic kidney disease, stage III (moderate) ([place]) 06/15/2018  . Leg pain 05/06/2018  . Lymphedema 05/06/2018  . Coagulopathy ([place]) 07/07/2017  . Lumbar spondylosis 03/10/2017  . Elevated uric acid in blood 11/20/2016  . BPH (benign prostatic hyperplasia) 05/29/2016  . Peripheral vascular disease of lower extremity ([place]) 03/03/2016  . Gallstone 12/31/2015  . 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 Atherosclerosis of aorta ([place]) 11/22/2015  . Carotid artery narrowing 11/22/2015  . Diverticulosis of colon 11/22/2015  . Decreased creatinine clearance 11/22/2015  . Arthritis, degenerative 11/22/2015  . Lactose intolerance 11/22/2015  . Basal cell carcinoma 11/22/2015  . Essential hypertension 06/21/2015  . Hyperlipemia 06/21/2015  . GERD (gastroesophageal reflux disease) 06/21/2015  . Calculus of kidney 11/18/2013    Orientation RESPIRATION BLADDER Height & Weight     Self, Time, Situation  Normal Continent Weight: 82.6 kg Height:  5\' 10"  (177.8 cm)  BEHAVIORAL SYMPTOMS/MOOD  NEUROLOGICAL BOWEL NUTRITION STATUS      Continent Diet(Dysphagia diet 3- nectar thick liquids)  AMBULATORY STATUS COMMUNICATION OF NEEDS Skin   Extensive Assist Verbally Normal                       Personal Care Assistance Level of Assistance  Bathing, Feeding, Dressing Bathing Assistance: Limited assistance Feeding assistance: Limited assistance Dressing Assistance: Limited assistance     Functional Limitations Info             SPECIAL CARE FACTORS FREQUENCY  PT (By licensed PT), OT (By licensed OT)     PT Frequency: 5 times per week OT Frequency: 5 times per week            Contractures Contractures Info: Not present    Additional Factors Info  Code Status, Allergies Code Status Info: Full Allergies Info: PCN           Current Medications (05/24/2019):  This is the current hospital active medication list Current Facility-Administered Medications  Medication Dose Route Frequency Provider Last Rate Last Dose  .  stroke: mapping our early stages of recovery book   Does not apply Once Pinole, Angela H, NP      . 0.9 %  sodium chloride infusion   Intravenous Continuous Seals, Theo Dills, NP 50 mL/hr at 05/22/19 0451    . acetaminophen (TYLENOL) tablet 650 mg  650 mg Oral Q4H PRN Seals, Theo Dills, NP       Or  . acetaminophen (TYLENOL) solution 650 mg  650 mg Per Tube Q4H PRN Seals, Theo Dills, NP       Or  . acetaminophen (  TYLENOL) suppository 650 mg  650 mg Rectal Q4H PRN Seals, Levada Dy H, NP      . aspirin suppository 300 mg  300 mg Rectal Daily Seals, Angela H, NP   300 mg at 05/22/19 1132   Or  . aspirin EC tablet 325 mg  325 mg Oral Daily Seals, Angela H, NP   325 mg at 05/24/19 1045  . clindamycin (CLEOCIN) IVPB 600 mg  600 mg Intravenous Q8H Seals, Angela H, NP 100 mL/hr at 05/24/19 0621 600 mg at 05/24/19 5041  . furosemide (LASIX) injection 40 mg  40 mg Intravenous Daily Clabe Seal, PA-C   40 mg at 05/24/19 1045  . pantoprazole (PROTONIX) injection 40 mg   40 mg Intravenous Q24H Seals, Levada Dy H, NP   40 mg at 05/24/19 0615  . rosuvastatin (CRESTOR) tablet 40 mg  40 mg Oral q1800 Max Sane, MD         Discharge Medications: Please see discharge summary for a list of discharge medications.  Relevant Imaging Results:  Relevant Lab Results:   Additional Information SS# [id number]  Shelbie Hutching, RN

## 2019-05-25 DIAGNOSIS — I63511 Cerebral infarction due to unspecified occlusion or stenosis of right middle cerebral artery: Secondary | ICD-10-CM | POA: Diagnosis present

## 2019-05-25 LAB — BASIC METABOLIC PANEL
Anion gap: 11 (ref 5–15)
BUN: 51 mg/dL — ABNORMAL HIGH (ref 8–23)
CO2: 26 mmol/L (ref 22–32)
Calcium: 8.7 mg/dL — ABNORMAL LOW (ref 8.9–10.3)
Chloride: 103 mmol/L (ref 98–111)
Creatinine, Ser: 1.11 mg/dL (ref 0.61–1.24)
GFR calc Af Amer: >60 mL/min (ref >60)
GFR calc non Af Amer: 58 mL/min — ABNORMAL LOW (ref >60)
Glucose, Bld: 118 mg/dL — ABNORMAL HIGH (ref 70–99)
Potassium: 3.3 mmol/L — ABNORMAL LOW (ref 3.5–5.1)
Sodium: 140 mmol/L (ref 135–145)

## 2019-05-25 NOTE — TOC Progression Note (Signed)
Transition of Care [facility]) - Progression Note    Patient Details  Name: Jose Castro MRN: [id number] Date of Birth: June 16, 1926  Transition of [facility]) CM/SW Contact  Shelbie Hutching, RN Phone Number: 05/25/2019, 4:28 PM  Clinical Narrative:    Family updated on plan of care.  CIR is reviewing for admission.    Expected Discharge Plan: [place] Barriers to Discharge: Continued Medical Work up  Expected Discharge Plan and Services Expected Discharge Plan: [place]   Discharge Planning Services: CM Consult Post Acute Care Choice: [place] Living arrangements for the past 2 months: Single Family Home Expected Discharge Date: 05/25/19                                     Social Determinants of Health (SDOH) Interventions    Readmission Risk Interventions No flowsheet data found.

## 2019-05-25 NOTE — Progress Notes (Signed)
Inpatient Rehabilitation-Admissions Coordinator   CIR consult received and appreciated. AC spoke with pt and his family via phone to discuss program details, expectations, ELOS, and caregiver support at DC. Pt and family in agreement to pursue CIR at this time. AC will begin insurance authorization for possible admit. Will follow up once there is a determination.   Please call if questions.   Jhonnie Garner, OTR/L  Rehab Admissions Coordinator  [phone] 05/25/2019 4:33 PM

## 2019-05-25 NOTE — Progress Notes (Signed)
[facility] [place] NAME: Jose Castro    MR#:  [id number]  DATE OF BIRTH:  December 03, 1925  SUBJECTIVE:   Chief Complaint  Patient presents with  . Fall   Patient is seen at the bedside. Granddaughter at the bedside.  He is stable week but able to follow commands without any difficulties. He is noted with moderate dysathria which seems to have improved. See detailed neuro exam. No new strokes or stroke like symptoms reported.  REVIEW OF SYSTEMS:  ROS Unable to obtain from patient due to moderate dysarthria DRUG ALLERGIES:   Allergies  Allergen Reactions  . Penicillins Itching and Swelling    Has patient had a PCN reaction causing immediate rash, facial/tongue/throat swelling, SOB or lightheadedness with hypotension: No Has patient had a PCN reaction causing severe rash involving mucus membranes or skin necrosis: No Has patient had a PCN reaction that required hospitalization: No Has patient had a PCN reaction occurring within the last 10 years: No If all of the above answers are "NO", then may proceed with Cephalosporin use.    VITALS:  Blood pressure (!) 141/69, pulse 60, temperature 97.8 F (36.6 C), temperature source Oral, resp. rate 18, height 5\' 10"  (1.778 m), weight 82.6 kg, SpO2 90 %. PHYSICAL EXAMINATION:   GENERAL:  85 y.o.-year-old patient lying in the bed with no acute distress.  EYES: Pupils equal, round, reactive to light and accommodation. No scleral icterus. Extraocular muscles intact.  HEENT: Head atraumatic, normocephalic. Oropharynx and nasopharynx clear.  NECK:  Supple, no jugular venous distention. No thyroid enlargement, no tenderness.  LUNGS: Normal breath sounds bilaterally, no wheezing, rales,rhonchi or crepitation. No use of accessory muscles of respiration.  CARDIOVASCULAR: S1, S2 normal. No murmurs, rubs, or gallops.  ABDOMEN: Soft, nontender, nondistended. Bowel sounds present. No organomegaly or mass.   EXTREMITIES: No pedal edema, cyanosis, or clubbing. No rash or lesions. + pedal pulses MUSCULOSKELETAL: Normal bulk, and power was 5+ grip and elbow, knee, and ankle flexion and extension bilaterally.  NEUROLOGIC: Patient awake, oriented x 3. Follows commands. Eye opening apraxia but much improved. Extraocular movements intact. Left homonomyous hemianopia. Left facial weakness. Dysarthria. No aphasia. Left upper extremity with 4/5 strength. Left lower extremity with 4/5 strength. Moves right side without difficulty, strength normal. Left upper extremity dysmetric. Plantars up on the right, down on the left. Sensation intact bilaterally. Gait not tested due to safety concern. PSYCHIATRIC: The patient is alert and oriented x 3.  SKIN: No obvious rash, lesion, or ulcer.   DATA REVIEWED:  LABORATORY PANEL:  Male CBC Recent Labs  Lab 05/24/19 0434  WBC 14.8*  HGB 16.7  HCT 50.4  PLT 251   ------------------------------------------------------------------------------------------------------------------ Chemistries  Recent Labs  Lab 05/23/19 1253  05/25/19 0719  NA 141   < > 140  K 3.5   < > 3.3*  CL 103   < > 103  CO2 26   < > 26  GLUCOSE 121*   < > 118*  BUN 42*   < > 51*  CREATININE 1.35*   < > 1.11  CALCIUM 8.5*   < > 8.7*  AST 20  --   --   ALT 17  --   --   ALKPHOS 62  --   --   BILITOT 2.1*  --   --    < > = values in this interval not displayed.   RADIOLOGY:  Dg Chest 1 View  Result  Date: 05/24/2019 CLINICAL DATA:  Aspiration pneumonia. EXAM: CHEST  1 VIEW COMPARISON:  05/22/2019. FINDINGS: Cardiac pacer with lead tip over the right atrium and right ventricle. Cardiomegaly with normal pulmonary vascularity. No focal infiltrate. Mild left base subsegmental atelectasis. No pleural effusion or pneumothorax. Degenerative change thoracic spine. IMPRESSION: 1. Cardiac pacer with lead tip over the right atrium right ventricle. Cardiomegaly. 2.  Mild left base subsegmental  atelectasis. Electronically Signed   By: Marcello Moores  Register   On: 05/24/2019 14:24   ASSESSMENT AND PLAN:   85 y.o. 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  Patient was found to have right ICA and right MCA territory acute infarction.  1. Acute right MCA stroke -clinical presentation suggestive of LVO likely cardioembolic in a patient with new onset afib? sick sinus s/p pacemaker - Unable to obtain MRI brain due to pacemaker - Repeat CT head showed evolution of right MCA stroke with edema - Hold Eliquis, restart tomorrow  - Continue Aspirin 81 mg/day  - Rosuvastatin to keep low density lipoprotein (LDL) <70 mg/dl - Telemetry monitoring - Neurochecks and VS per stroke protocol - PT/OT/Speech following, cleared for a diet - Neurology input appreciated - HTN,HLD, AFIB risk factor management as below  2. New onset atrial fibrillation -rate controlled - Holding Eliquis due to risk of hemorrhagic transformation in large territory stroke - Aspirin as above  3. Aspiration pneumonia - Continue Clindamycin IV - Repeat chest xray mild left base subsegmental atelectasis - Aspiration precautions  4. Elevated troponin-likely demand supply ischemia in the setting of acute stroke  5. Hypertension - Allow permissive hypertension in the setting of stroke. Will treat if SBP >160  6. CKD - Continue to monitor renal functions.  All the records are reviewed and case discussed with Care Management/Social Worker. Management plans discussed with the patient, family and they are in agreement.  CODE STATUS: Full Code  TOTAL TIME TAKING CARE OF THIS PATIENT: 35 minutes.   More than 50% of the time was spent in counseling/coordination of care: YES  POSSIBLE D/C IN 2 DAYS, DEPENDING ON CLINICAL CONDITION.    05/25/2019 at 6:03 PM   Rufina Falco, DNP, FNP-BC [facility] Nurse Practitioner Between 7am to 6pm - Pager - [phone]  After 6pm go to [email or web address] - password [email or web address] [facility]  Office  [phone]  CC: Primary care physician; Steele Sizer, MD  Note: This dictation was prepared with Dragon dictation along with smaller phrase technology. Any transcriptional errors that result from this process are unintentional.

## 2019-05-25 NOTE — Progress Notes (Signed)
Rehab Admissions Coordinator Note:  Per PT and OT recommendation, this patient was screened by Jhonnie Garner for appropriateness for an Inpatient Acute Rehab Consult.  At this time, we are recommending Inpatient Rehab consult. AC will contact MD to request order.   Jhonnie Garner 05/25/2019, 8:04 AM  I can be reached at [phone].

## 2019-05-25 NOTE — Progress Notes (Signed)
OT Cancellation Note  Patient Details Name: Jose Castro MRN: [id number] DOB: 1926-05-08   Cancelled Treatment:    Reason Eval/Treat Not Completed: Other (comment). Pt/family meeting with CM/SW upon attempt. Will re-attempt OT tx next date as appropriate.   Jeni Salles, MPH, MS, OTR/L ascom [phone] 05/25/19, 4:39 PM

## 2019-05-25 NOTE — Progress Notes (Signed)
Physical Therapy Treatment Patient Details Name: NOLON YELLIN MRN: [id number] DOB: 09-Feb-1926 Today's Date: 05/25/2019    History of Present Illness 85 y.o. 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  Patient was found to have right ICA and right MCA territory acute infarction.  Repeat imaging significant for notation of cytotoxic edema (no hemorrhage) with mild midline shift to L.  Cleared by neurology for therapy.    PT Comments    Improved standing balance and midline orientation this date, performing transfers, standing balance and gait activities with less physical assist from therapist.  Does list L with divided attention or fatigue (mild pushing at times), but corrects with cuing and modification of functional task. Spontaneously turning head to L of midline with greater ease and freqeuncy (approx 25-40% time), but still requires cuing to visualize full L visual field. Vitals stable and WFL throughout session.  Excellent participation throughout.  Extended discussion with granddaughter about goals, expectations and general structure of CIR program; grand-daughter remains very hopeful for transition at discharge and is encouraged by patient's progress/participation this date.   Follow Up Recommendations  CIR     Equipment Recommendations       Recommendations for Other Services       Precautions / Restrictions Precautions Precautions: Fall;ICD/Pacemaker Precaution Comments: Nectar liquids Restrictions Weight Bearing Restrictions: No    Mobility  Bed Mobility               General bed mobility comments: seated in recliner beginning/end of treatment session  Transfers Overall transfer level: Needs assistance Equipment used: Rolling walker (2 wheeled) Transfers: Sit to/from  Stand Sit to Stand: Min assist;Mod assist         General transfer comment: cuing for hand placement, anterior weight translation; heavy use of UEs on armrests to initiate lift off.  Ambulation/Gait Ambulation/Gait assistance: Min assist;Mod assist Gait Distance (Feet): (20' x2) Assistive device: Rolling walker (2 wheeled)       General Gait Details: partially reciprocal stepping pattern with narrowed BOS (L LE slightly adducted), L lateral lean with fatigue. Min/mod manual cuing for R ant/lateral weight shift throughout.  Veers right, assist for walker management and path negotiation.   Stairs             Wheelchair Mobility    Modified Rankin (Stroke Patients Only)       Balance Overall balance assessment: Needs assistance Sitting-balance support: No upper extremity supported;Feet supported Sitting balance-Leahy Scale: Fair     Standing balance support: Bilateral upper extremity supported Standing balance-Leahy Scale: Poor                              Cognition Arousal/Alertness: Awake/alert Behavior During Therapy: WFL for tasks assessed/performed Overall Cognitive Status: Within Functional Limits for tasks assessed                                 General Comments: L inattention      Exercises Other Exercises Other Exercises: Unsupported standing at elevated tray table, participated with functional balance activities including bilat UE reaching (modified D2 extension for R ant/lateral weight shift), visual scanning, cognitive processing, L UE coordination. Min/mod cuing throughout.  Excellent effort with task.  Does list to L  with divided attention or fatigue (progressive L knee flexion), corrects with mod cuing.    General Comments        Pertinent Vitals/Pain Pain Assessment: No/denies pain(reports R foot/toe pain improved this date)    Home Living                      Prior Function            PT Goals  (current goals can now be found in the care plan section) Acute Rehab PT Goals Patient Stated Goal: to get stronger and return home PT Goal Formulation: With patient/family Time For Goal Achievement: 06/07/19 Potential to Achieve Goals: Good Progress towards PT goals: Progressing toward goals    Frequency    7X/week      PT Plan Current plan remains appropriate    Co-evaluation              AM-PAC PT "6 Clicks" Mobility   Outcome Measure  Help needed turning from your back to your side while in a flat bed without using bedrails?: A Little Help needed moving from lying on your back to sitting on the side of a flat bed without using bedrails?: A Little Help needed moving to and from a bed to a chair (including a wheelchair)?: A Little Help needed standing up from a chair using your arms (e.g., wheelchair or bedside chair)?: A Little Help needed to walk in hospital room?: A Lot Help needed climbing 3-5 steps with a railing? : A Lot 6 Click Score: 16    End of Session Equipment Utilized During Treatment: Gait belt Activity Tolerance: Patient tolerated treatment well Patient left: in chair;with chair alarm set;with family/visitor present;with call bell/phone within reach Nurse Communication: Mobility status PT Visit Diagnosis: Hemiplegia and hemiparesis;Muscle weakness (generalized) (M62.81) Hemiplegia - Right/Left: Left Hemiplegia - dominant/non-dominant: Non-dominant Hemiplegia - caused by: Cerebral infarction     Time: 1342-1430 PT Time Calculation (min) (ACUTE ONLY): 48 min  Charges:  $Gait Training: 8-22 mins $Therapeutic Activity: 8-22 mins $Neuromuscular Re-education: 8-22 mins                     Wenceslao Loper H. Owens Shark, PT, DPT, NCS 05/25/19, 3:28 PM [phone]

## 2019-05-25 NOTE — Care Management Important Message (Signed)
Important Message  Patient Details  Name: CALI CUARTAS MRN: [id number] Date of Birth: 06-06-1926   Medicare Important Message Given:  Yes     [name] A Yarimar Lavis 05/25/2019, 11:00 AM

## 2019-05-25 NOTE — TOC Progression Note (Signed)
Transition of Care [facility]) - Progression Note    Patient Details  Name: Jose Castro MRN: [id number] Date of Birth: Jul 27, 1926  [facility] CM/SW Contact  Shelbie Hutching, RN Phone Number: 05/25/2019, 2:47 PM  Clinical Narrative:    Physical therapy has recommended CIR and family would like for patient to go to CIR.  Claiborne Billings with CIR is reviewing patient for appropriateness.  Jhonnie Garner OT [phone].  Expected Discharge Plan: [place] Barriers to Discharge: Continued Medical Work up  Expected Discharge Plan and Services Expected Discharge Plan: [place]   Discharge Planning Services: CM Consult Post Acute Care Choice: [place] Living arrangements for the past 2 months: Single Family Home Expected Discharge Date: 05/25/19                                     Social Determinants of Health (SDOH) Interventions    Readmission Risk Interventions No flowsheet data found.

## 2019-05-25 NOTE — Progress Notes (Signed)
SLP Cancellation Note  Patient Details Name: Jose Castro MRN: [id number] DOB: October 04, 1926   Cancelled treatment:       Reason Eval/Treat Not Completed: Patient not medically ready(chart reviewed; consulted NSG, MD re: pt's status). BSE performed today w/ good participation both w/ swallowing and w/ Cognitive-linguistic skills/abilities. Pt was verbally engaging w/ appropriate responses.  Noted Head CT revealing acute RIGHT MCA infarct. Progressive cytotoxic edema in the right anterior and posterior temporal lobe and in the posterior limb internal capsule on the right. There is also Mild midline shift to the left due to edema.  ST services will f/u w/ Cognitive-linguistic assessment next 1-2 days post time for spontaneous healing in light of Head CT results. He is able to verbally communicate quite appropriately all wants/needs; Facetimes frequently w/ family on the computer as well!    Orinda Kenner, MS, CCC-SLP Watson,Katherine 05/25/2019, 9:42 AM

## 2019-05-25 NOTE — PMR Pre-admission (Signed)
PMR Admission Coordinator Pre-Admission Assessment  Patient: Jose Castro is an 85 y.o., male MRN: [id number] DOB: 24-Aug-1926 Height: _0  (177.8 cm) Weight: 82.6 kg  Insurance Information HMO:     PPO: Yes     PCP:      IPA:      80/20:      OTHER:  PRIMARY: [employer] Medicare      Policy#: [id number]      Subscriber: Patient CM Name: Kristine Royal      Phone#: [phone]     Fax#: [phone] Auth provided by Kristine Royal. for admit to CIR on 6/26. Pt is approved for 7 days. Updates are due to Dametri Ozburn (p): [phone] Wanda Plump): [phone] Pre-Cert#: [id number]      Employer:  Benefits:  Phone #: online     Name: [email or web address] Eff. Date: 12/01/2018 - still active     Deduct: $290.00 ($290.00 met)      Out of Pocket Max: $3,290 (includes deductible - $302.44 met)      Life Max: NA CIR: 80% coverage, 20% co-insurance, up to a max of $2,461 per admission if stay is 6 days or less or up to a max of $2,721 per admission if stay is 7-10 days, or the member reaches their OOP max for stays greater than 10 days      SNF: 80% coverage, 20% co-insurance with a max co-insurance of $20/day for days 1-20 and max co-insurance $178/day for days 21-100; limited to 100 days/benefit period  Outpatient: 80% ; limited by medical necessity   Co-Pay: 20% Home Health: 100% coverage; limited by medical necessity      Co-Pay: 0% DME: 80% coverage     Co-Pay: 20% co-insurance Providers:  SECONDARY: None      Policy#:       Subscriber:  CM Name:       Phone#:      Fax#:  Pre-Cert#:       Employer:  Benefits:  Phone #:      Name:  Eff. Date:      Deduct:       Out of Pocket Max:       Life Max:  CIR:       SNF:  Outpatient:      Co-Pay:  Home Health:       Co-Pay:  DME:      Co-Pay:   Medicaid Application Date:       Case Manager:  Disability Application Date:       Case Worker:   The "Data Collection Information Summary" for patients in Inpatient Rehabilitation Facilities with attached "Privacy Act [place] Records" was provided and verbally reviewed with: Patient  Emergency Contact Information Contact Information    Name Relation Home Work Mobile   Hessler,Lorraine Daughter [phone]     Ulyses Jarred Daughter [phone]    Basilia Jumbo Granddaughter   [phone]      Current Medical History  Patient Admitting Diagnosis: Acute Right MCA stroke   History of Present Illness: Jose Castro is a 85 year old right-handed male with history of hypertension, hyperlipidemia, CKD stage II with creatinine 1.28-1.35, dysrhythmia with heart block status post pacemaker placement August 2019 followed by Dr Clayborn Bigness. Presented 05/22/2019 after being found down on the floor at home with reported left fixed eye deviation to the right.   WBC 25,400, urinalysis negative, COVID negative, CK 970, blood cultures no growth to date.  Chest x-ray no focal infiltrate.  Mild left base  subsegmental atelectasis.  Cranial CT scan showed multiple areas of acute nonhemorrhagic infarct right hemisphere.  MRI not completed due to pacemaker.  Echocardiogram with ejection fraction of 16% normal systolic function.  Hospital course atrial fibrillation with cardiology services consulted borderline troponin  0.05 felt to be related to demand ischemia.  Patient initially on aspirin for CVA prophylaxis and changed to Eliquis 05/26/2019.    Dysphasia #3 nectar thick liquids. WBC improved 14,800 and afebrile. Therapy evaluations completed and patient is to be admitted for a comprehensive rehab program on 05/27/19.   Complete NIHSS TOTAL: 2  Patient's medical record from [facility] has been reviewed by the rehabilitation admission coordinator and physician.  Past Medical History  Past Medical History:  Diagnosis Date  . Arthritis   . BPH (benign prostatic hyperplasia)   . Chronic kidney disease    had a kidney stone which per family was a cyst that was removed  . Dysrhythmia   . GERD  (gastroesophageal reflux disease)   . History of kidney stones   . Hyperlipidemia   . Hypertension   . Splenic vein thrombosis   . Stroke ([place]) 05/22/2019  . Thrombosis 12/2015   mural  area and no notation of heart attack    Family History   family history includes Aortic aneurysm in his mother; Cancer in his brother; Heart attack in his maternal aunt and maternal uncle; Heart disease in his mother.  Prior Rehab/Hospitalizations Has the patient had prior rehab or hospitalizations prior to admission? No  Has the patient had major surgery during 100 days prior to admission? No   Current Medications  Current Facility-Administered Medications:  .  0.9 %  sodium chloride infusion, , Intravenous, PRN, Lang Snow, NP, Last Rate: 10 mL/hr at 05/27/19 0530, 250 mL at 05/27/19 0530 .  acetaminophen (TYLENOL) tablet 650 mg, 650 mg, Oral, Q4H PRN, 650 mg at 05/26/19 1611 **OR** acetaminophen (TYLENOL) solution 650 mg, 650 mg, Per Tube, Q4H PRN **OR** acetaminophen (TYLENOL) suppository 650 mg, 650 mg, Rectal, Q4H PRN, Seals, Angela H, NP .  apixaban (ELIQUIS) tablet 5 mg, 5 mg, Oral, BID, Dallie Piles, RPH, 5 mg at 05/27/19 1096 .  chlorhexidine (PERIDEX) 0.12 % solution 15 mL, 15 mL, Mouth Rinse, BID, [place], Bing Neighbors, NP, 15 mL at 05/26/19 2024 .  clindamycin (CLEOCIN) IVPB 600 mg, 600 mg, Intravenous, Q8H, Seals, Angela H, NP, Last Rate: 100 mL/hr at 05/27/19 0532, 600 mg at 05/27/19 0532 .  MEDLINE mouth rinse, 15 mL, Mouth Rinse, q12n4p, Ouma, Bing Neighbors, NP .  pantoprazole (PROTONIX) injection 40 mg, 40 mg, Intravenous, Q24H, Seals, Angela H, NP, 40 mg at 05/27/19 0515 .  rosuvastatin (CRESTOR) tablet 40 mg, 40 mg, Oral, q1800, Max Sane, MD, 40 mg at 05/26/19 1804  Patients Current Diet:  Diet Order            DIET DYS 3 Room service appropriate? Yes with Assist; Fluid consistency: Nectar Thick  Diet effective now              Precautions /  Restrictions Precautions Precautions: Fall, ICD/Pacemaker Precaution Comments: Nectar liquids Restrictions Weight Bearing Restrictions: No   Has the patient had 2 or more falls or a fall with injury in the past year? Yes  Prior Activity Level Community (5-7x/wk): very active, independent in mobility and ADLs prior; drove PTA  Prior Functional Level Self Care: Did the patient need help bathing, dressing, using the toilet or eating? Independent  Indoor Mobility: Did the patient need assistance with walking from room to room (with or without device)? Independent  Stairs: Did the patient need assistance with internal or external stairs (with or without device)? Independent  Functional Cognition: Did the patient need help planning regular tasks such as shopping or remembering to take medications? Independent  Home Assistive Devices / Equipment Home Assistive Devices/Equipment: None Home Equipment: None  Prior Device Use: Indicate devices/aids used by the patient prior to current illness, exacerbation or injury? None of the above  Current Functional Level Cognition  Overall Cognitive Status: Within Functional Limits for tasks assessed Orientation Level: Oriented X4 General Comments: L inattention    Extremity Assessment (includes Sensation/Coordination)  Upper Extremity Assessment: (L UE grossly 4-/5, positive drift, mod coordination deficits; denies sensory deficit and noted with intact light touch (isolation), but positive extinction with simultaneous stimulation) RUE Deficits / Details: grossly 5/5 RUE Sensation: WNL RUE Coordination: WNL LUE Deficits / Details: grossly 4+/5 LUE Sensation: WNL LUE Coordination: WNL  Lower Extremity Assessment: (L LE grossly 4-/5, negative clonus/babinski, mod coordination deficits; denies sensory deficit and noted with intact light touch (isolation), but positive extinction with simultaneous stimulation) RLE Deficits / Details: grossly  4+/5 RLE Sensation: WNL RLE Coordination: WNL LLE Deficits / Details: grossly 4/5 LLE Sensation: WNL LLE Coordination: WNL    ADLs  Overall ADL's : Needs assistance/impaired Eating/Feeding: NPO Eating/Feeding Details (indicate cue type and reason): SLP in at end of session to further assess, but pt does not appear to have difficulty with [facility] tasks Upper Body Bathing: Sitting, Minimal assistance Lower Body Bathing: Sit to/from stand, Minimal assistance, Moderate assistance Lower Body Bathing Details (indicate cue type and reason): increased assist required once in standing 2/2 decreased balance Upper Body Dressing : Sitting, Minimal assistance Lower Body Dressing: Sit to/from stand, Minimal assistance, Moderate assistance Lower Body Dressing Details (indicate cue type and reason): increased assist required once in standing 2/2 decreased balance Toilet Transfer: BSC, RW, Minimal assistance, Cueing for safety, Ambulation Toilet Transfer Details (indicate cue type and reason): posterior lean    Mobility  Overal bed mobility: Needs Assistance Bed Mobility: Supine to Sit Supine to sit: Min assist, Mod assist Sit to supine: Min assist, +2 for physical assistance General bed mobility comments: Deferred (pt sitting in recliner at beginning/end of session)    Transfers  Overall transfer level: Needs assistance Equipment used: Rolling walker (2 wheeled) Transfers: Sit to/from Stand Sit to Stand: Min assist, Mod assist General transfer comment: min to mod assist x1 1st trial standing; min assist x1 2nd trial standing; vc's to shift weight forward when standing, for UE/LE placement, and assist to initiate stand (and assist for initial standing balance d/t mild posterior lean)    Ambulation / Gait / Stairs / Wheelchair Mobility  Ambulation/Gait Ambulation/Gait assistance: Min assist, Mod assist Gait Distance (Feet): (24 feet x2) Assistive device: Rolling walker (2 wheeled) General Gait  Details: decreased B LE step length (vc's to take longer steps), mild L lateral lean with fatigue, L LE slightly adducted requiring vc's for wider BOS; assist to navigate around obstacles on L side with RW Gait velocity: decreased    Posture / Balance Dynamic Sitting Balance Sitting balance - Comments: pt noted with L lateral lean sitting in chair Balance Overall balance assessment: Needs assistance Sitting-balance support: No upper extremity supported, Feet supported Sitting balance-Leahy Scale: Fair Sitting balance - Comments: pt noted with L lateral lean sitting in chair Postural control: Left lateral lean, Posterior lean  Standing balance support: Bilateral upper extremity supported Standing balance-Leahy Scale: Poor Standing balance comment: slight posterior lean initially in standing requiring min assist to shift weight forward with vc's; B UE support on RW    Special needs/care consideration BiPAP/CPAP : no CPM : no Continuous Drip IV : clindamycin  Dialysis : no        Days : no Life Vest : no Oxygen : yes, 1L [place] Special Bed : no Trach Size : no Wound Vac (area) : no      Location : no Skin: Left arm skin tear per family; charted abrasion to Bilateral arms, ecchymosis to elbows, MASD arm.                         Bowel mgmt: last BM: 05/26/19 incontinent Bladder mgmt: incontinent Diabetic mgmt: no Behavioral consideration : no, due to some confusion during early hospital stay, he has been allowed a visits (has been granddaughter Press photographer) Chemo/radiation : no   Previous Environmental health practitioner (from acute therapy documentation) Living Arrangements: Alone Available Help at Discharge: Family, Available 24 hours/day(family willing to coordinate 24/7 care as needed at discharge) Type of Home: House Home Layout: Two level, 1/2 bath on main level, Bed/bath upstairs Home Access: Stairs to enter Entrance Stairs-Rails: None Entrance Stairs-Number of Steps: 2 steps in back Bathroom  Shower/Tub: Walk-in shower, Charity fundraiser: [place]: No  Discharge Living Setting Plans for Discharge Living Setting: Patient's home, Other (Comment)(will have 24/7 from family) Type of Home at Discharge: House Discharge Home Layout: Multi-level Alternate Level Stairs-Rails: Can reach both(family plans to install additional rail to allow for [place]) Alternate Level Stairs-Number of Steps: 5 steps, then landing, then 9. (one side) family plans to put in rail on other side prior to DC home.  Discharge Home Access: Stairs to enter Entrance Stairs-Rails: None (family plans to put in rails prior to home DC).  Entrance Stairs-Number of Steps: 3 Discharge Bathroom Shower/Tub: Walk-in shower Discharge Bathroom Toilet: Standard Discharge Bathroom Accessibility: Yes How Accessible: Accessible via walker Does the patient have any problems obtaining your medications?: No  Social/Family/Support Systems Patient Roles: Other (Comment)(Independent; lots of family nearby to assist) Contact Information: Jaclyn Shaggy (granddaughter): [phone]; [name] (daughter and main contact): [phone]; daughter [name]: [phone]; [name] (daughter): [phone]; grandchild Krista Blue who is a CNA) Anticipated Caregiver: see above ^ Anticipated Caregiver's Contact Information: see above Ability/Limitations of Caregiver: Min A Caregiver Availability: 24/7 Discharge Plan Discussed with Primary Caregiver: Yes Is Caregiver In Agreement with Plan?: Yes Does Caregiver/Family have Issues with Lodging/Transportation while Pt is in Rehab?: No  Goals/Additional Needs Patient/Family Goal for Rehab: PT/OT/SLP: Supervision Expected length of stay: 8-12 days Cultural Considerations: Catholic Dietary Needs: DYS 3, nectar thick liquids; room service with assist. (extra gracy on meats, potatoes, cream soups at meals, no straws. Equipment Needs: TBD Pt/Family Agrees to Admission and willing to  participate: Yes Program Orientation Provided & Reviewed with Pt/Caregiver Including Roles  & Responsibilities: Yes(pt, Norlene Duel)  Barriers to Discharge: Home environment access/layout, New oxygen  Barriers to Discharge Comments: bedroom/fullbath on 2nd story; will need to navigate steps. Will ahve 24/7 A as needed  Decrease burden of Care through IP rehab admission: No  Possible need for SNF placement upon discharge:  Not anticipated; pt has good social support at DC with good prognosis for further progress through CIR.   Patient Condition: I have reviewed medical records from [facility], spoken  with CM, and patient, daughter and family member. I discussed via phone for inpatient rehabilitation assessment.  Patient will benefit from ongoing PT, OT and SLP, can actively participate in 3 hours of therapy a day 5 days of the week, and can make measurable gains during the admission.  Patient will also benefit from the coordinated team approach during an Inpatient Acute Rehabilitation admission.  The patient will receive intensive therapy as well as Rehabilitation physician, nursing, social worker, and care management interventions.  Due to bladder management, bowel management, safety, skin/wound care, disease management, medication administration, pain management and patient education the patient requires 24 hour a day rehabilitation nursing.  The patient is currently Min/Mod A with mobility and basic ADLs.  Discharge setting and therapy post discharge at home with home health is anticipated.  Patient has agreed to participate in the Acute Inpatient Rehabilitation Program and will admit 05/27/19.  Preadmission Screen Completed By:  Jhonnie Garner, 05/27/2019 9:40 AM ______________________________________________________________________   Discussed status with Dr. Naaman Plummer on 05/27/19 at 9:40AM and received approval for admission today.  Admission Coordinator:  Jhonnie Garner,  OT, time 9:40AM/Date 05/27/19   Assessment/Plan: Diagnosis: right MCA infarct 1. Does the need for close, 24 hr/day Medical supervision in concert with the patient's rehab needs make it unreasonable for this patient to be served in a less intensive setting? Yes 2. Co-Morbidities requiring supervision/potential complications: CKD, htn 3. Due to bladder management, bowel management, safety, skin/wound care, disease management, medication administration, pain management and patient education, does the patient require 24 hr/day rehab nursing? Yes 4. Does the patient require coordinated care of a physician, rehab nurse, PT (1-2 hrs/day, 5 days/week), OT (1-2 hrs/day, 5 days/week) and SLP (1-2 hrs/day, 5 days/week) to address physical and functional deficits in the context of the above medical diagnosis(es)? Yes Addressing deficits in the following areas: balance, endurance, locomotion, strength, transferring, bowel/bladder control, bathing, dressing, feeding, grooming, toileting, cognition, speech and psychosocial support 5. Can the patient actively participate in an intensive therapy program of at least 3 hrs of therapy 5 days a week? Yes 6. The potential for patient to make measurable gains while on inpatient rehab is excellent 7. Anticipated functional outcomes upon discharge from inpatients are: supervision PT, supervision OT, supervision SLP 8. Estimated rehab length of stay to reach the above functional goals is: 8-12 days 9. Anticipated D/C setting: Home 10. Anticipated post D/C treatments: Page therapy 11. Overall Rehab/Functional Prognosis: excellent  MD Signature: Meredith Staggers, MD, [facility] 05/27/2019

## 2019-05-26 LAB — BASIC METABOLIC PANEL
Anion gap: 11 (ref 5–15)
BUN: 47 mg/dL — ABNORMAL HIGH (ref 8–23)
CO2: 26 mmol/L (ref 22–32)
Calcium: 8.5 mg/dL — ABNORMAL LOW (ref 8.9–10.3)
Chloride: 101 mmol/L (ref 98–111)
Creatinine, Ser: 1.12 mg/dL (ref 0.61–1.24)
GFR calc Af Amer: >60 mL/min (ref >60)
GFR calc non Af Amer: 58 mL/min — ABNORMAL LOW (ref >60)
Glucose, Bld: 114 mg/dL — ABNORMAL HIGH (ref 70–99)
Potassium: 3.3 mmol/L — ABNORMAL LOW (ref 3.5–5.1)
Sodium: 138 mmol/L (ref 135–145)

## 2019-05-26 LAB — MAGNESIUM: Magnesium: 2.2 mg/dL (ref 1.7–2.4)

## 2019-05-26 MED ORDER — SODIUM CHLORIDE 0.9 % IV SOLN
INTRAVENOUS | Status: DC | PRN
  Administered 2019-05-26 – 2019-05-27 (×2): 250 mL via INTRAVENOUS

## 2019-05-26 MED ORDER — APIXABAN 5 MG PO TABS
5.0000 mg | ORAL_TABLET | Freq: Two times a day (BID) | ORAL | Status: DC
  Administered 2019-05-26 – 2019-05-27 (×2): 5 mg via ORAL
  Filled 2019-05-26 (×2): qty 1

## 2019-05-26 MED ORDER — POTASSIUM CHLORIDE 20 MEQ PO PACK
20.0000 meq | PACK | Freq: Once | ORAL | Status: AC
  Administered 2019-05-26: 20:00:00 20 meq via ORAL
  Filled 2019-05-26: qty 1

## 2019-05-26 NOTE — Progress Notes (Signed)
[place] [facility] [place] NAME: Duriel Deery    MR#:  [id number]  DATE OF BIRTH:  Dec 26, 1925  SUBJECTIVE:   Chief Complaint  Patient presents with  . Fall   Patient is seen at the bedside. Granddaughter at the bedside.  He is up with PT today and tolerated therapy well. Still with mild dysarthria. For possible dc to inpatient rehab tomorrow.  REVIEW OF SYSTEMS:  ROS Unable to obtain from patient due to moderate dysarthria DRUG ALLERGIES:   Allergies  Allergen Reactions  . Penicillins Itching and Swelling    Has patient had a PCN reaction causing immediate rash, facial/tongue/throat swelling, SOB or lightheadedness with hypotension: No Has patient had a PCN reaction causing severe rash involving mucus membranes or skin necrosis: No Has patient had a PCN reaction that required hospitalization: No Has patient had a PCN reaction occurring within the last 10 years: No If all of the above answers are "NO", then may proceed with Cephalosporin use.    VITALS:  Blood pressure (!) 148/82, pulse 61, temperature 97.6 F (36.4 C), resp. rate 20, height 5\' 10"  (1.778 m), weight 82.6 kg, SpO2 93 %. PHYSICAL EXAMINATION:   GENERAL:  85 y.o.-year-old patient lying in the bed with no acute distress.  EYES: Pupils equal, round, reactive to light and accommodation. No scleral icterus. Extraocular muscles intact.  HEENT: Head atraumatic, normocephalic. Oropharynx and nasopharynx clear.  NECK:  Supple, no jugular venous distention. No thyroid enlargement, no tenderness.  LUNGS: Normal breath sounds bilaterally, no wheezing, rales,rhonchi or crepitation. No use of accessory muscles of respiration.  CARDIOVASCULAR: S1, S2 normal. No murmurs, rubs, or gallops.  ABDOMEN: Soft, nontender, nondistended. Bowel sounds present. No organomegaly or mass.  EXTREMITIES: No pedal edema, cyanosis, or clubbing. No rash or lesions. + pedal pulses MUSCULOSKELETAL: Normal  bulk, and power was 5+ grip and elbow, knee, and ankle flexion and extension bilaterally.  NEUROLOGIC: Patient awake, oriented x 3. Follows commands. Eye opening apraxia but much improved. Extraocular movements intact. Left homonomyous hemianopia. Left facial weakness. Dysarthria. No aphasia. Left upper extremity with 4/5 strength. Left lower extremity with 4/5 strength. Moves right side without difficulty, strength normal. Left upper extremity dysmetric. Plantars up on the right, down on the left. Sensation intact bilaterally. Gait not tested due to safety concern. PSYCHIATRIC: The patient is alert and oriented x 3.  SKIN: No obvious rash, lesion, or ulcer.   DATA REVIEWED:  LABORATORY PANEL:  Male CBC Recent Labs  Lab 05/24/19 0434  WBC 14.8*  HGB 16.7  HCT 50.4  PLT 251   ------------------------------------------------------------------------------------------------------------------ Chemistries  Recent Labs  Lab 05/23/19 1253  05/26/19 0653  NA 141   < > 138  K 3.5   < > 3.3*  CL 103   < > 101  CO2 26   < > 26  GLUCOSE 121*   < > 114*  BUN 42*   < > 47*  CREATININE 1.35*   < > 1.12  CALCIUM 8.5*   < > 8.5*  MG  --   --  2.2  AST 20  --   --   ALT 17  --   --   ALKPHOS 62  --   --   BILITOT 2.1*  --   --    < > = values in this interval not displayed.   RADIOLOGY:  No results found. ASSESSMENT AND PLAN:   85 y.o. 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  Patient was found to have right ICA and right MCA territory acute infarction.  1. Acute right MCA stroke -clinical presentation suggestive of LVO likely cardioembolic in a patient with new onset afib? sick sinus s/p pacemaker - Unable to obtain MRI brain due to pacemaker - Repeat CT head showed evolution of right MCA stroke with  edema - Start Eliquis -Stop Aspirin 81 mg/day  - Rosuvastatin to keep low density lipoprotein (LDL) <70 mg/dl - Telemetry monitoring - Neurochecks and VS per stroke protocol - PT/OT/Speech following, cleared for a diet - Neurology input appreciated - HTN,HLD, AFIB risk factor management as below  2. New onset atrial fibrillation -rate controlled - Start Eliquis - Stop Aspirin  3. Aspiration pneumonia - Continue Clindamycin IV - Repeat chest xray mild left base subsegmental atelectasis - Aspiration precautions  4. Elevated troponin-likely demand supply ischemia in the setting of acute stroke  5. Hypertension - Allow permissive hypertension in the setting of stroke. Will treat if SBP >160  6. CKD - Continue to monitor renal functions.  All the records are reviewed and case discussed with Care Management/Social Worker. Management plans discussed with the patient, family and they are in agreement.  CODE STATUS: Full Code  TOTAL TIME TAKING CARE OF THIS PATIENT: 35 minutes.   More than 50% of the time was spent in counseling/coordination of care: YES  POSSIBLE D/C IN 2 DAYS, DEPENDING ON CLINICAL CONDITION.   05/26/2019 at 5:34 PM   Rufina Falco, DNP, FNP-BC [facility] Nurse Practitioner Between 7am to 6pm - Pager - [phone]  After 6pm go to [email or web address] - password [email or web address] [place] [facility]  Office  [phone]  CC: Primary care physician; Steele Sizer, MD  Note: This dictation was prepared with Dragon dictation along with smaller phrase technology. Any transcriptional errors that result from this process are unintentional.

## 2019-05-26 NOTE — Progress Notes (Signed)
Inpatient Rehabilitation-Admissions Coordinator   [facility] has receive insurance approval for admit to CIR. AC has updated family and will call CM tomorrow morning (6/26) to confirm possible admit, pending medical clearance.   Please call if questions.   Jhonnie Garner, OTR/L  Rehab Admissions Coordinator  [phone] 05/26/2019 6:12 PM

## 2019-05-26 NOTE — H&P (Signed)
Physical Medicine and Rehabilitation Admission H&P    Chief Complaint  Patient presents with  . Fall  : HPI: Jose Castro is a 85 year old right-handed male with history of hypertension, hyperlipidemia, CKD stage II with creatinine 1.28-1.35, dysrhythmia with heart block status post pacemaker placement August 2019 followed by Dr Clayborn Bigness.  Per chart review patient lives alone.  Independent in all aspects of driving and ADLs.  2 level home with half bath on main level bed and bathroom upstairs.  Family plans to provide 24/7 assistance as needed at discharge.  Presented 05/22/2019 after being found down on the floor at home with reported left fixed eye deviation to the right.   WBC 25,400, urinalysis negative, COVID negative, CK 970, blood cultures no growth to date.  Chest x-ray no focal infiltrate.  Mild left base subsegmental atelectasis.  Cranial CT scan showed multiple areas of acute nonhemorrhagic infarct right hemisphere.  MRI not completed due to pacemaker.  Echocardiogram with ejection fraction of 38% normal systolic function.  Hospital course atrial fibrillation with cardiology services consulted borderline troponin  0.05 felt to be related to demand ischemia.  Patient initially on aspirin for CVA prophylaxis and changed to Eliquis 05/26/2019.    Dysphasia #2 honey thick liquids. WBC improved 14,800 and afebrile. Therapy evaluations completed and patient was admitted for a comprehensive rehab program.  Review of Systems  Constitutional: Negative for chills and fever.  HENT: Negative for hearing loss.   Eyes: Negative for blurred vision and double vision.  Respiratory: Negative for cough and shortness of breath.   Cardiovascular: Positive for palpitations and leg swelling. Negative for chest pain.  Gastrointestinal: Positive for constipation. Negative for heartburn, nausea and vomiting.       GERD  Genitourinary: Positive for urgency.  Musculoskeletal: Positive for joint pain and  myalgias.  Skin: Negative for rash.  Neurological: Positive for focal weakness.  All other systems reviewed and are negative.  Past Medical History:  Diagnosis Date  . Arthritis   . BPH (benign prostatic hyperplasia)   . Chronic kidney disease    had a kidney stone which per family was a cyst that was removed  . Dysrhythmia   . GERD (gastroesophageal reflux disease)   . History of kidney stones   . Hyperlipidemia   . Hypertension   . Splenic vein thrombosis   . Stroke ([place]) 05/22/2019  . Thrombosis 12/2015   mural  area and no notation of heart attack   Past Surgical History:  Procedure Laterality Date  . APPENDECTOMY    . CATARACT EXTRACTION, BILATERAL    . CYSTOSCOPY WITH INSERTION OF UROLIFT    . KIDNEY STONE SURGERY    . KNEE SURGERY    . PACEMAKER INSERTION N/A 07/13/2018   Procedure: INSERTION PACEMAKER-DUEL CHAMBER INITIAL IMPLANT;  Surgeon: Isaias Cowman, MD;  Location: [facility] ORS;  Service: Cardiovascular;  Laterality: N/A;  . SPINE SURGERY     Family History  Problem Relation Age of Onset  . Cancer Brother        bladder cancer with mets  . Heart disease Mother   . Aortic aneurysm Mother   . Heart attack Maternal Aunt   . Heart attack Maternal Uncle    Social History:  reports that he quit smoking about 58 years ago. His smoking use included cigarettes. He started smoking about 74 years ago. He quit after 16.00 years of use. He has never used smokeless tobacco. He reports current alcohol use of about  3.0 standard drinks of alcohol per week. He reports that he does not use drugs. Allergies:  Allergies  Allergen Reactions  . Penicillins Itching and Swelling    Has patient had a PCN reaction causing immediate rash, facial/tongue/throat swelling, SOB or lightheadedness with hypotension: No Has patient had a PCN reaction causing severe rash involving mucus membranes or skin necrosis: No Has patient had a PCN reaction that required hospitalization: No Has  patient had a PCN reaction occurring within the last 10 years: No If all of the above answers are "NO", then may proceed with Cephalosporin use.    Medications Prior to Admission  Medication Sig Dispense Refill  . Cholecalciferol (VITAMIN D3) 2000 units TABS Take 2,000 Units by mouth daily.     Marland Kitchen CIALIS 5 MG tablet Take 5 mg by mouth daily as needed for erectile dysfunction.     . finasteride (PROSCAR) 5 MG tablet Take 5 mg by mouth daily.     Marland Kitchen lisinopril (PRINIVIL,ZESTRIL) 20 MG tablet Take 1 tablet by mouth daily.    . Omega-3 Fatty Acids (FISH OIL) 1000 MG CAPS Take 1,000 mg by mouth daily.     . pantoprazole (PROTONIX) 40 MG tablet TAKE ONE TABLET EVERY DAY 90 tablet 1    Drug Regimen Review Drug regimen was reviewed and remains appropriate with no significant issues identified  Home: Home Living Family/patient expects to be discharged to:: Private residence Living Arrangements: Alone Available Help at Discharge: Family, Available 24 hours/day(family willing to coordinate 24/7 care as needed at discharge) Type of Home: House Home Access: Stairs to enter [employer] of Steps: 2 steps in back Entrance Stairs-Rails: None Home Layout: Two level, 1/2 bath on main level, Bed/bath upstairs Bathroom Shower/Tub: Gaffer, Door [employer] Toilet: Standard Home Equipment: None   Functional History: Prior Function Level of Independence: Independent Comments: Pt indep in all aspects, driving, and no additional falls.  Functional Status:  Mobility: Bed Mobility Overal bed mobility: Needs Assistance Bed Mobility: Supine to Sit Supine to sit: Min assist, Mod assist Sit to supine: Min assist, +2 for physical assistance General bed mobility comments: Deferred (pt sitting in recliner at beginning/end of session) Transfers Overall transfer level: Needs assistance Equipment used: Rolling walker (2 wheeled) Transfers: Sit to/from Stand Sit to Stand: Min assist, Mod  assist General transfer comment: min to mod assist x1 1st trial standing; min assist x1 2nd trial standing; vc's to shift weight forward when standing, for UE/LE placement, and assist to initiate stand (and assist for initial standing balance d/t mild posterior lean) Ambulation/Gait Ambulation/Gait assistance: Min assist, Mod assist Gait Distance (Feet): (24 feet x2) Assistive device: Rolling walker (2 wheeled) General Gait Details: decreased B LE step length (vc's to take longer steps), mild L lateral lean with fatigue, L LE slightly adducted requiring vc's for wider BOS; assist to navigate around obstacles on L side with RW Gait velocity: decreased    ADL: ADL Overall ADL's : Needs assistance/impaired Eating/Feeding: NPO Eating/Feeding Details (indicate cue type and reason): SLP in at end of session to further assess, but pt does not appear to have difficulty with [facility] tasks Upper Body Bathing: Sitting, Minimal assistance Lower Body Bathing: Sit to/from stand, Minimal assistance, Moderate assistance Lower Body Bathing Details (indicate cue type and reason): increased assist required once in standing 2/2 decreased balance Upper Body Dressing : Sitting, Minimal assistance Lower Body Dressing: Sit to/from stand, Minimal assistance, Moderate assistance Lower Body Dressing Details (indicate cue type and reason): increased  assist required once in standing 2/2 decreased balance Toilet Transfer: BSC, RW, Minimal assistance, Cueing for safety, Ambulation Toilet Transfer Details (indicate cue type and reason): posterior lean  Cognition: Cognition Overall Cognitive Status: Within Functional Limits for tasks assessed Orientation Level: Oriented X4 Cognition Arousal/Alertness: Awake/alert Behavior During Therapy: WFL for tasks assessed/performed Overall Cognitive Status: Within Functional Limits for tasks assessed General Comments: L inattention  Physical Exam: Blood pressure (!) 125/57, pulse  60, temperature 99.8 F (37.7 C), temperature source Oral, resp. rate 18, height 5\' 10"  (1.778 m), weight 82.6 kg, SpO2 100 %. Physical Exam  Neurological:  Alert/NAD.Oriented X3.   Cooperative with exam Follows commands Fair awareness of deficits  .    Results for orders placed or performed during the hospital encounter of 05/21/19 (from the past 48 hour(s))  Basic metabolic panel     Status: Abnormal   Collection Time: 05/26/19  6:53 AM  Result Value Ref Range   Sodium 138 135 - 145 mmol/L   Potassium 3.3 (L) 3.5 - 5.1 mmol/L   Chloride 101 98 - 111 mmol/L   CO2 26 22 - 32 mmol/L   Glucose, Bld 114 (H) 70 - 99 mg/dL   BUN 47 (H) 8 - 23 mg/dL   Creatinine, Ser 1.12 0.61 - 1.24 mg/dL   Calcium 8.5 (L) 8.9 - 10.3 mg/dL   GFR calc non Af Amer 58 (L) >60 mL/min   GFR calc Af Amer >60 >60 mL/min   Anion gap 11 5 - 15    Comment: Performed at [facility], [address] [place], [place] [address]  Magnesium     Status: None   Collection Time: 05/26/19  6:53 AM  Result Value Ref Range   Magnesium 2.2 1.7 - 2.4 mg/dL    Comment: Performed at [facility], [address] [place], [place] [address]  Basic metabolic panel     Status: Abnormal   Collection Time: 05/27/19  6:07 AM  Result Value Ref Range   Sodium 136 135 - 145 mmol/L   Potassium 3.5 3.5 - 5.1 mmol/L   Chloride 101 98 - 111 mmol/L   CO2 25 22 - 32 mmol/L   Glucose, Bld 124 (H) 70 - 99 mg/dL   BUN 41 (H) 8 - 23 mg/dL   Creatinine, Ser 1.16 0.61 - 1.24 mg/dL   Calcium 8.3 (L) 8.9 - 10.3 mg/dL   GFR calc non Af Amer 55 (L) >60 mL/min   GFR calc Af Amer >60 >60 mL/min   Anion gap 10 5 - 15    Comment: Performed at [facility], [address] [place], [place] [address]   No results found.     Medical Problem List and Plan: 1.  Dysarthria, right gaze deviation with decreased functional mobility secondary to right MCA infarction 2.  Antithrombotics: -DVT/anticoagulation:  Eliquis  -antiplatelet therapy: N/A 3. Pain Management: Tylenol as needed 4. Mood: Provide emotional support  -antipsychotic agents: N/A 5. Neuropsych: This patient is capable of making decisions on his own behalf. 6. Skin/Wound Care: Routine skin checks 7. Fluids/Electrolytes/Nutrition: Routine in and outs with follow-up chemistries 8.  Dysphagia.  Dysphasia #2 Honey liquids.  Follow-up speech therapy 9.  Complete heart block.  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   10.  New onset atrial fibrillation.  Cardiac rate controlled.  Continue Eliquis as well as initiation of Toprol-XL 25 mg daily 11.  Hyperlipidemia.  Crestor 12.  BPH.  Proscar 5 mg daily 13.  Permissive hypertension.  Patient on lisinopril 20 mg daily prior to admission 14.  GERD.  Protonix 15.Leukocytosis .Improved with BC negative,Urinalysis negative nitrite, chest x-ray with no infiltrate 16.  CKD.  Baseline creatinine 1.28-1.35.  Follow-up chemistries      Cathlyn Parsons, PA-C 05/27/2019

## 2019-05-26 NOTE — Consult Note (Signed)
[place] for apixaban dosing Indication: atrial fibrillation  Patient Measurements: Height: 5\' 10"  (177.8 cm) Weight: 182 lb (82.6 kg) IBW/kg (Calculated) : 73  Vital Signs: BP: 145/82 (06/25 1117) Pulse Rate: 60 (06/25 1117)  Labs: Recent Labs    05/24/19 0434 05/25/19 0719 05/26/19 0653  HGB 16.7  --   --   HCT 50.4  --   --   PLT 251  --   --   CREATININE 1.25* 1.11 1.12    Estimated Creatinine Clearance: 45.3 mL/min (by C-G formula based on SCr of 1.12 mg/dL).   Medical History: Past Medical History:  Diagnosis Date  . Arthritis   . BPH (benign prostatic hyperplasia)   . Chronic kidney disease    had a kidney stone which per family was a cyst that was removed  . Dysrhythmia   . GERD (gastroesophageal reflux disease)   . History of kidney stones   . Hyperlipidemia   . Hypertension   . Splenic vein thrombosis   . Stroke ([place]) 05/22/2019  . Thrombosis 12/2015   mural  area and no notation of heart attack    Medications:  Scheduled:  . chlorhexidine  15 mL Mouth Rinse BID  . mouth rinse  15 mL Mouth Rinse q12n4p  . pantoprazole (PROTONIX) IV  40 mg Intravenous Q24H  . rosuvastatin  40 mg Oral q1800    Assessment: 85 y.o. male with a h/o pacemaker placement. He had pacemaker placement secondary to bradycardia and continues to be followed by Dr. Clayborn Bigness. He has no history of atrial fibrillation or flutter. He was started on apixaban in house but it was subsequently held for an elevated risk of hemorrhage. He was switched to enoxaparin but has received no doses within the past 24 hours  Goal of Therapy:  Monitor platelets by anticoagulation protocol: Yes   Plan:  Start apixaban 5 mg twice daily: he has only 1/3 risk factors for dose reduction  Dallie Piles, PharmD 05/26/2019,3:46 PM

## 2019-05-26 NOTE — Progress Notes (Signed)
OT Cancellation Note  Patient Details Name: Jose Castro MRN: [id number] DOB: 04-18-1926   Cancelled Treatment:    Reason Eval/Treat Not Completed: Other (comment) Upon arrival to room, pt sleeping in room recliner with granddaughter at bedside. Per granddaughter, pt had just finished with PT and would probably need a little time to rest/recover before next therapy session. Per granddaughter, pt tends to have more energy/endurance in the am at baseline. OT will re-attempt at a later time/date as available and pt appropriate for OT tx.  Shara Blazing, M.S., OTR/L Ascom: [phone] 05/26/19, 3:49 PM

## 2019-05-26 NOTE — Progress Notes (Addendum)
SLP Cancellation Note  Patient Details Name: Jose Castro MRN: [id number] DOB: 07/23/1926   Cancelled treatment:       Reason Eval/Treat Not Completed: Patient at procedure or test/unavailable. Reviewed chart notes; consulted SW who stated pt has been accepted at [facility] and will discharge tomorrow. Pt is currently working w/ PT. Met w/ Granddaughter and discussed pt's status currently. He is tolerating the Nectar consistency liquids well w/ no overt s/s of aspiration noted by herself and NSG. Of note, both noted that pt takes large, more gulping sips of Nectar intermittently. Granddaughter felt he did "better" w/ this at meals but required verbal cues.  Suggested since he would be discharging to [facility] tomorrow, not consistent w/ awareness of precautions when drinking, and in light of new CVA w/ Edema and min Midline shift, pt might benefit from remaining on the Nectar liquids until at CIR when further Neuro. healing has occurred and he would be in a more consistent setting. Granddaughter agreed fully; NSG updated and agreed.  Recommend continue w/ Dysphagia level 3 diet w/ Nectar liquids; aspiration precautions and reducing distractions at meals to allow pt to focus on task/precautions; verbal cues and monitoring.  Also, recommend f/u w/ Cognitive evaluation (R MCA CVA) once at next venue of care.     Katherine Watson, MS, CCC-SLP Watson,Katherine 05/25/2019, 04:45 PM   

## 2019-05-26 NOTE — Progress Notes (Signed)
Physical Therapy Treatment Patient Details Name: Jose Castro MRN: [id number] DOB: August 07, 1926 Today's Date: 05/26/2019    History of Present Illness 85 y.o. 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  Patient was found to have right ICA and right MCA territory acute infarction.  Repeat imaging significant for notation of cytotoxic edema (no hemorrhage) with mild midline shift to L.  Cleared by neurology for therapy.    PT Comments    Pt resting in recliner upon PT arrival with L lean noted.  Pt with mild posterior lean initially in standing requiring vc's and assist to shift weight forward (B UE support on RW).  In standing activities, pt occasionally pushing L arm into therapist for balance d/t L list/lean at times.  Pt requiring cueing/assist for walker navigation with turns and navigating around obstacles on L side.  Pt initially only looking to just past midline to L side but during session significant improved ability to turn head to L noted (but pt still needing cueing to look to L side with activities).  Pt tolerated session well and appearing very motivated to participate in therapy.   Follow Up Recommendations  [facility]     Equipment Recommendations  Rolling walker with 5" wheels    Recommendations for Other Services       Precautions / Restrictions Precautions Precautions: Fall;ICD/Pacemaker Precaution Comments: Nectar liquids Restrictions Weight Bearing Restrictions: No    Mobility  Bed Mobility               General bed mobility comments: Deferred (pt sitting in recliner at beginning/end of session)  Transfers Overall transfer level: Needs assistance Equipment used: Rolling walker (2 wheeled) Transfers: Sit to/from Stand Sit to Stand: Min assist;Mod assist         General  transfer comment: min to mod assist x1 1st trial standing; min assist x1 2nd trial standing; vc's to shift weight forward when standing, for UE/LE placement, and assist to initiate stand (and assist for initial standing balance d/t mild posterior lean)  Ambulation/Gait Ambulation/Gait assistance: Min assist;Mod assist Gait Distance (Feet): (24 feet x2) Assistive device: Rolling walker (2 wheeled)   Gait velocity: decreased   General Gait Details: decreased B LE step length (vc's to take longer steps), mild L lateral lean with fatigue, L LE slightly adducted requiring vc's for wider BOS; assist to navigate around obstacles on L side with RW   Stairs             Wheelchair Mobility    Modified Rankin (Stroke Patients Only)       Balance Overall balance assessment: Needs assistance Sitting-balance support: No upper extremity supported;Feet supported Sitting balance-Leahy Scale: Fair Sitting balance - Comments: pt noted with L lateral lean sitting in chair Postural control: Left lateral lean;Posterior lean Standing balance support: Bilateral upper extremity supported Standing balance-Leahy Scale: Poor Standing balance comment: slight posterior lean initially in standing requiring min assist to shift weight forward with vc's; B UE support on RW                            Cognition Arousal/Alertness: Awake/alert Behavior During Therapy: [facility] for tasks assessed/performed Overall Cognitive Status: Within Functional Limits for tasks assessed  General Comments: L inattention      Exercises      General Comments   Nursing cleared pt for participation in physical therapy.  Pt agreeable to PT session.  Pt's granddaughter present during session.      Pertinent Vitals/Pain Pain Assessment: 0-10 Pain Score: 2 (7/10 with ambulation) Pain Location: R great toe Pain Descriptors / Indicators: Throbbing Pain Intervention(s):  Limited activity within patient's tolerance;Monitored during session;Repositioned  Vitals (HR and O2 on 1 L via nasal cannula) stable and WFL throughout treatment session.    Home Living                      Prior Function            PT Goals (current goals can now be found in the care plan section) Acute Rehab PT Goals Patient Stated Goal: to get stronger and return home PT Goal Formulation: With patient/family Time For Goal Achievement: 06/07/19 Potential to Achieve Goals: Good Progress towards PT goals: Progressing toward goals    Frequency    7X/week      PT Plan Current plan remains appropriate    Co-evaluation              AM-PAC PT "6 Clicks" Mobility   Outcome Measure  Help needed turning from your back to your side while in a flat bed without using bedrails?: A Little Help needed moving from lying on your back to sitting on the side of a flat bed without using bedrails?: A Little Help needed moving to and from a bed to a chair (including a wheelchair)?: A Little Help needed standing up from a chair using your arms (e.g., wheelchair or bedside chair)?: A Little Help needed to walk in hospital room?: A Lot Help needed climbing 3-5 steps with a railing? : A Lot 6 Click Score: 16    End of Session Equipment Utilized During Treatment: Gait belt Activity Tolerance: Patient tolerated treatment well Patient left: in chair;with chair alarm set;with family/visitor present;with call bell/phone within reach(granddaughter present) Nurse Communication: Mobility status PT Visit Diagnosis: Hemiplegia and hemiparesis;Muscle weakness (generalized) (M62.81) Hemiplegia - Right/Left: Left Hemiplegia - dominant/non-dominant: Non-dominant Hemiplegia - caused by: Cerebral infarction     Time: 5003-7048 PT Time Calculation (min) (ACUTE ONLY): 41 min  Charges:  $Gait Training: 8-22 mins $Therapeutic Exercise: 8-22 mins $Therapeutic Activity: 8-22 mins                     Leitha Bleak, PT 05/26/19, 4:29 PM [phone]

## 2019-05-26 NOTE — TOC Progression Note (Signed)
Transition of Care [facility]) - Progression Note    Patient Details  Name: ROALD LUKACS MRN: [id number] Date of Birth: 04-19-1926  Transition of [facility] CM/SW Contact  Shelbie Hutching, RN Phone Number: 05/26/2019, 9:52 AM  Clinical Narrative:    Patient has been accepted by [facility] pending insurance authorization.  Patient will discharge to CIR today or tomorrow depending on when insurance authorization is obtained.     Expected Discharge Plan: [place] Barriers to Discharge: Continued Medical Work up  Expected Discharge Plan and Services Expected Discharge Plan: [place]   Discharge Planning Services: CM Consult Post Acute Care Choice: [place] Living arrangements for the past 2 months: Single Family Home Expected Discharge Date: 05/25/19                                     Social Determinants of Health (SDOH) Interventions    Readmission Risk Interventions No flowsheet data found.

## 2019-05-27 ENCOUNTER — Other Ambulatory Visit: Payer: Self-pay

## 2019-05-27 ENCOUNTER — Inpatient Hospital Stay (HOSPITAL_COMMUNITY)
Admission: RE | Admit: 2019-05-27 | Discharge: 2019-06-08 | DRG: 057 | Disposition: A | Discharge status: Home Health | Payer: Medicare Other | Source: Intra-hospital | Attending: Physical Medicine & Rehabilitation | Admitting: Physical Medicine & Rehabilitation

## 2019-05-27 DIAGNOSIS — M79674 Pain in right toe(s): Secondary | ICD-10-CM | POA: Diagnosis not present

## 2019-05-27 DIAGNOSIS — I459 Conduction disorder, unspecified: Secondary | ICD-10-CM | POA: Diagnosis present

## 2019-05-27 DIAGNOSIS — R1312 Dysphagia, oropharyngeal phase: Secondary | ICD-10-CM | POA: Diagnosis not present

## 2019-05-27 DIAGNOSIS — R5383 Other fatigue: Secondary | ICD-10-CM | POA: Diagnosis not present

## 2019-05-27 DIAGNOSIS — I69391 Dysphagia following cerebral infarction: Secondary | ICD-10-CM

## 2019-05-27 DIAGNOSIS — I69322 Dysarthria following cerebral infarction: Secondary | ICD-10-CM | POA: Diagnosis present

## 2019-05-27 DIAGNOSIS — H518 Other specified disorders of binocular movement: Secondary | ICD-10-CM | POA: Diagnosis present

## 2019-05-27 DIAGNOSIS — Z87891 Personal history of nicotine dependence: Secondary | ICD-10-CM | POA: Diagnosis not present

## 2019-05-27 DIAGNOSIS — I442 Atrioventricular block, complete: Secondary | ICD-10-CM | POA: Diagnosis present

## 2019-05-27 DIAGNOSIS — Z8249 Family history of ischemic heart disease and other diseases of the circulatory system: Secondary | ICD-10-CM | POA: Diagnosis not present

## 2019-05-27 DIAGNOSIS — D72829 Elevated white blood cell count, unspecified: Secondary | ICD-10-CM

## 2019-05-27 DIAGNOSIS — Z880 Allergy status to penicillin: Secondary | ICD-10-CM | POA: Diagnosis not present

## 2019-05-27 DIAGNOSIS — K219 Gastro-esophageal reflux disease without esophagitis: Secondary | ICD-10-CM | POA: Diagnosis present

## 2019-05-27 DIAGNOSIS — N400 Enlarged prostate without lower urinary tract symptoms: Secondary | ICD-10-CM | POA: Diagnosis present

## 2019-05-27 DIAGNOSIS — N182 Chronic kidney disease, stage 2 (mild): Secondary | ICD-10-CM | POA: Diagnosis present

## 2019-05-27 DIAGNOSIS — M19071 Primary osteoarthritis, right ankle and foot: Secondary | ICD-10-CM | POA: Diagnosis present

## 2019-05-27 DIAGNOSIS — I10 Essential (primary) hypertension: Secondary | ICD-10-CM | POA: Diagnosis not present

## 2019-05-27 DIAGNOSIS — I4891 Unspecified atrial fibrillation: Secondary | ICD-10-CM | POA: Diagnosis present

## 2019-05-27 DIAGNOSIS — H5589 Other irregular eye movements: Secondary | ICD-10-CM | POA: Diagnosis present

## 2019-05-27 DIAGNOSIS — I63511 Cerebral infarction due to unspecified occlusion or stenosis of right middle cerebral artery: Secondary | ICD-10-CM

## 2019-05-27 DIAGNOSIS — E785 Hyperlipidemia, unspecified: Secondary | ICD-10-CM | POA: Diagnosis present

## 2019-05-27 DIAGNOSIS — Z950 Presence of cardiac pacemaker: Secondary | ICD-10-CM | POA: Diagnosis not present

## 2019-05-27 DIAGNOSIS — I129 Hypertensive chronic kidney disease with stage 1 through stage 4 chronic kidney disease, or unspecified chronic kidney disease: Secondary | ICD-10-CM | POA: Diagnosis present

## 2019-05-27 LAB — BASIC METABOLIC PANEL
Anion gap: 10 (ref 5–15)
BUN: 41 mg/dL — ABNORMAL HIGH (ref 8–23)
CO2: 25 mmol/L (ref 22–32)
Calcium: 8.3 mg/dL — ABNORMAL LOW (ref 8.9–10.3)
Chloride: 101 mmol/L (ref 98–111)
Creatinine, Ser: 1.16 mg/dL (ref 0.61–1.24)
GFR calc Af Amer: >60 mL/min (ref >60)
GFR calc non Af Amer: 55 mL/min — ABNORMAL LOW (ref >60)
Glucose, Bld: 124 mg/dL — ABNORMAL HIGH (ref 70–99)
Potassium: 3.5 mmol/L (ref 3.5–5.1)
Sodium: 136 mmol/L (ref 135–145)

## 2019-05-27 MED ORDER — ACETAMINOPHEN 325 MG PO TABS
650.0000 mg | ORAL_TABLET | ORAL | Status: DC | PRN
  Administered 2019-05-28 – 2019-06-04 (×5): 650 mg via ORAL
  Filled 2019-05-27 (×5): qty 2

## 2019-05-27 MED ORDER — FINASTERIDE 5 MG PO TABS
5.0000 mg | ORAL_TABLET | Freq: Every day | ORAL | Status: DC
  Administered 2019-05-28 – 2019-06-08 (×12): 5 mg via ORAL
  Filled 2019-05-27 (×13): qty 1

## 2019-05-27 MED ORDER — ROSUVASTATIN CALCIUM 40 MG PO TABS: 40.0000 mg | ORAL_TABLET | Freq: Every day | ORAL | 0 refills | Status: DC

## 2019-05-27 MED ORDER — CLINDAMYCIN HCL 300 MG PO CAPS: 300.0000 mg | ORAL_CAPSULE | Freq: Four times a day (QID) | ORAL | 0 refills | Status: DC

## 2019-05-27 MED ORDER — APIXABAN 2.5 MG PO TABS: 2.5000 mg | ORAL_TABLET | Freq: Two times a day (BID) | ORAL | Status: DC

## 2019-05-27 MED ORDER — ROSUVASTATIN CALCIUM 20 MG PO TABS
40.0000 mg | ORAL_TABLET | Freq: Every day | ORAL | Status: DC
  Administered 2019-05-28 – 2019-06-07 (×11): 40 mg via ORAL
  Filled 2019-05-27 (×11): qty 2

## 2019-05-27 MED ORDER — ACETAMINOPHEN 650 MG RE SUPP: 650.0000 mg | RECTAL | Status: DC | PRN

## 2019-05-27 MED ORDER — APIXABAN 2.5 MG PO TABS: 2.5000 mg | ORAL_TABLET | Freq: Two times a day (BID) | ORAL | 0 refills | Status: DC

## 2019-05-27 MED ORDER — PANTOPRAZOLE SODIUM 40 MG IV SOLR
40.0000 mg | INTRAVENOUS | Status: DC
  Administered 2019-05-28: 40 mg via INTRAVENOUS
  Filled 2019-05-27 (×2): qty 40

## 2019-05-27 MED ORDER — APIXABAN 2.5 MG PO TABS
2.5000 mg | ORAL_TABLET | Freq: Two times a day (BID) | ORAL | Status: DC
  Administered 2019-05-27 – 2019-06-08 (×24): 2.5 mg via ORAL
  Filled 2019-05-27 (×24): qty 1

## 2019-05-27 MED ORDER — METOPROLOL SUCCINATE ER 25 MG PO TB24: 25.0000 mg | ORAL_TABLET | Freq: Every day | ORAL | 0 refills | Status: DC

## 2019-05-27 MED ORDER — ACETAMINOPHEN 160 MG/5ML PO SOLN: 650.0000 mg | ORAL | Status: DC | PRN

## 2019-05-27 MED ORDER — METOPROLOL SUCCINATE ER 25 MG PO TB24
25.0000 mg | ORAL_TABLET | Freq: Every day | ORAL | Status: DC
  Administered 2019-05-28 – 2019-06-08 (×12): 25 mg via ORAL
  Filled 2019-05-27 (×12): qty 1

## 2019-05-27 MED ORDER — CLINDAMYCIN HCL 150 MG PO CAPS
300.0000 mg | ORAL_CAPSULE | Freq: Four times a day (QID) | ORAL | Status: DC
  Administered 2019-05-27: 300 mg via ORAL
  Filled 2019-05-27 (×3): qty 2

## 2019-05-27 NOTE — Progress Notes (Addendum)
Meredith Staggers, MD  Physician  Physical Medicine and Rehabilitation  PMR Pre-admission  Signed  Date of Service:  05/25/2019 6:40 PM      Related encounter: ED to Hosp-Admission (Discharged) from 05/21/2019 in [place] (1C)      Signed         PMR Admission Coordinator Pre-Admission Assessment  Patient: Jose Castro is an 85 y.o., male MRN: [id number] DOB: 09-01-1926 Height: 5' 10" (177.8 cm) Weight: 82.6 kg  Insurance Information HMO:     PPO: Yes     PCP:      IPA:      80/20:      OTHER:  PRIMARY: [employer] Medicare      Policy#: [id number]      Subscriber: Patient CM Name: Kristine Royal      Phone#: [phone]     Fax#: [phone] Auth provided by Kristine Royal. for admit to CIR on 6/26. Pt is approved for 7 days. Updates are due to Benedetto Ryder (p): [phone] Wanda Plump): [phone] Pre-Cert#: [id number]      Employer:  Benefits:  Phone #: online     Name: [email or web address] Eff. Date: 12/01/2018 - still active     Deduct: $290.00 ($290.00 met)      Out of Pocket Max: $3,290 (includes deductible - $302.44 met)      Life Max: NA CIR: 80% coverage, 20% co-insurance, up to a max of $2,461 per admission if stay is 6 days or less or up to a max of $2,721 per admission if stay is 7-10 days, or the member reaches their OOP max for stays greater than 10 days      SNF: 80% coverage, 20% co-insurance with a max co-insurance of $20/day for days 1-20 and max co-insurance $178/day for days 21-100; limited to 100 days/benefit period  Outpatient: 80% ; limited by medical necessity   Co-Pay: 20% Home Health: 100% coverage; limited by medical necessity      Co-Pay: 0% DME: 80% coverage     Co-Pay: 20% co-insurance Providers:  SECONDARY: None      Policy#:       Subscriber:  CM Name:       Phone#:      Fax#:  Pre-Cert#:       Employer:  Benefits:  Phone #:      Name:  Eff. Date:      Deduct:       Out of Pocket Max:       Life Max:  CIR:       SNF:  Outpatient:       Co-Pay:  Home Health:       Co-Pay:  DME:      Co-Pay:   Medicaid Application Date:       Case Manager:  Disability Application Date:       Case Worker:   The "Data Collection Information Summary" for patients in Inpatient Rehabilitation Facilities with attached "Privacy Act [place] Records" was provided and verbally reviewed with: Patient  Emergency Contact Information         Contact Information    Name Relation Home Work Mobile   Jose Castro Daughter [phone]     Jose Castro Daughter [phone]    Jose Castro Granddaughter   [phone]      Current Medical History  Patient Admitting Diagnosis: Acute Right MCA stroke   History of Present Illness: Jose Castro is a 85 year old right-handed male with history of hypertension, hyperlipidemia,  CKD stage II with creatinine 1.28-1.35, dysrhythmia with heart block status post pacemaker placement August 2019 followed by [facility]. Presented 05/22/2019 after being found down on the floor at home with reported left fixed eye deviation to the right. WBC 25,400, urinalysis negative, COVID negative, CK 970, blood cultures no growth to date.Chest x-ray no focal infiltrate. Mild left base subsegmental atelectasis. Cranial CT scan showed multiple areas of acute nonhemorrhagic infarct right hemisphere.MRI not completed due to pacemaker. Echocardiogram with ejection fraction of 02% normal systolic function. Hospital course atrial fibrillation with cardiology services consulted borderline troponin 0.31flt to be related to demand ischemia. Patient initially on aspirin for CVA prophylaxis and changed to Eliquis 05/26/2019.Dysphasia #3 nectar thick liquids. WBC improved 14,800 and afebrile.Therapy evaluations completed and patient is to be admitted for a comprehensive rehab program on 05/27/19.   Complete NIHSS TOTAL: 2  [facility] and physician.  Past Medical History      Past Medical History:  Diagnosis Date  . Arthritis   . BPH (benign prostatic hyperplasia)   . Chronic kidney disease    had a kidney stone which per family was a cyst that was removed  . Dysrhythmia   . GERD (gastroesophageal reflux disease)   . History of kidney stones   . Hyperlipidemia   . Hypertension   . Splenic vein thrombosis   . Stroke ([place] 05/22/2019  . Thrombosis 12/2015   mural  area and no notation of heart attack    Family History   family history includes Aortic aneurysm in his mother; Cancer in his brother; Heart attack in his maternal aunt and maternal uncle; Heart disease in his mother.  Prior Rehab/Hospitalizations Has the patient had prior rehab or hospitalizations prior to admission? No  Has the patient had major surgery during 100 days prior to admission? No              Current Medications  Current Facility-Administered Medications:  .  0.9 %  sodium chloride infusion, , Intravenous, PRN, OLang Snow NP, Last Rate: 10 mL/hr at 05/27/19 0530, 250 mL at 05/27/19 0530 .  acetaminophen (TYLENOL) tablet 650 mg, 650 mg, Oral, Q4H PRN, 650 mg at 05/26/19 1611 **OR** acetaminophen (TYLENOL) solution 650 mg, 650 mg, Per Tube, Q4H PRN **OR** acetaminophen (TYLENOL) suppository 650 mg, 650 mg, Rectal, Q4H PRN, Seals, Angela H, NP .  apixaban (ELIQUIS) tablet 5 mg, 5 mg, Oral, BID, GDallie Piles RPH, 5 mg at 05/27/19 06378.  chlorhexidine (PERIDEX) 0.12 % solution 15 mL, 15 mL, Mouth Rinse, BID, Ouma, EBing Neighbors NP, 15 mL at 05/26/19 2024 .  clindamycin (CLEOCIN) IVPB 600 mg, 600 mg, Intravenous, Q8H, Seals, Angela H, NP, Last Rate: 100 mL/hr at 05/27/19 0532, 600 mg at 05/27/19 0532 .  MEDLINE mouth rinse, 15 mL, Mouth Rinse, q12n4p, Ouma, EBing Neighbors NP .  pantoprazole (PROTONIX) injection 40 mg, 40 mg,  Intravenous, Q24H, Seals, Angela H, NP, 40 mg at 05/27/19 0515 .  rosuvastatin (CRESTOR) tablet 40 mg, 40 mg, Oral, q1800, SMax Sane MD, 40 mg at 05/26/19 1804  Patients Current Diet:     Diet Order                  DIET DYS 3 Room service appropriate? Yes with Assist; Fluid consistency: Nectar Thick  Diet effective now  Precautions / Restrictions Precautions Precautions: Fall, ICD/Pacemaker Precaution Comments: Nectar liquids Restrictions Weight Bearing Restrictions: No   Has the patient had 2 or more falls or a fall with injury in the past year? Yes  Prior Activity Level Community (5-7x/wk): very active, independent in mobility and ADLs prior; drove PTA  Prior Functional Level Self Care: Did the patient need help bathing, dressing, using the toilet or eating? Independent  Indoor Mobility: Did the patient need assistance with walking from room to room (with or without device)? Independent  Stairs: Did the patient need assistance with internal or external stairs (with or without device)? Independent  Functional Cognition: Did the patient need help planning regular tasks such as shopping or remembering to take medications? Independent  Home Assistive Devices / Equipment Home Assistive Devices/Equipment: None Home Equipment: None  Prior Device Use: Indicate devices/aids used by the patient prior to current illness, exacerbation or injury? None of the above  Current Functional Level Cognition  Overall Cognitive Status: Within Functional Limits for tasks assessed Orientation Level: Oriented X4 General Comments: L inattention    Extremity Assessment (includes Sensation/Coordination)  Upper Extremity Assessment: (L UE grossly 4-/5, positive drift, mod coordination deficits; denies sensory deficit and noted with intact light touch (isolation), but positive extinction with simultaneous stimulation) RUE Deficits / Details: grossly 5/5 RUE  Sensation: WNL RUE Coordination: WNL LUE Deficits / Details: grossly 4+/5 LUE Sensation: WNL LUE Coordination: WNL  Lower Extremity Assessment: (L LE grossly 4-/5, negative clonus/babinski, mod coordination deficits; denies sensory deficit and noted with intact light touch (isolation), but positive extinction with simultaneous stimulation) RLE Deficits / Details: grossly 4+/5 RLE Sensation: WNL RLE Coordination: WNL LLE Deficits / Details: grossly 4/5 LLE Sensation: WNL LLE Coordination: WNL    ADLs  Overall ADL's : Needs assistance/impaired Eating/Feeding: NPO Eating/Feeding Details (indicate cue type and reason): SLP in at end of session to further assess, but pt does not appear to have difficulty with [facility] tasks Upper Body Bathing: Sitting, Minimal assistance Lower Body Bathing: Sit to/from stand, Minimal assistance, Moderate assistance Lower Body Bathing Details (indicate cue type and reason): increased assist required once in standing 2/2 decreased balance Upper Body Dressing : Sitting, Minimal assistance Lower Body Dressing: Sit to/from stand, Minimal assistance, Moderate assistance Lower Body Dressing Details (indicate cue type and reason): increased assist required once in standing 2/2 decreased balance Toilet Transfer: BSC, RW, Minimal assistance, Cueing for safety, Ambulation Toilet Transfer Details (indicate cue type and reason): posterior lean    Mobility  Overal bed mobility: Needs Assistance Bed Mobility: Supine to Sit Supine to sit: Min assist, Mod assist Sit to supine: Min assist, +2 for physical assistance General bed mobility comments: Deferred (pt sitting in recliner at beginning/end of session)    Transfers  Overall transfer level: Needs assistance Equipment used: Rolling walker (2 wheeled) Transfers: Sit to/from Stand Sit to Stand: Min assist, Mod assist General transfer comment: min to mod assist x1 1st trial standing; min assist x1 2nd trial standing;  vc's to shift weight forward when standing, for UE/LE placement, and assist to initiate stand (and assist for initial standing balance d/t mild posterior lean)    Ambulation / Gait / Stairs / Wheelchair Mobility  Ambulation/Gait Ambulation/Gait assistance: Min assist, Mod assist Gait Distance (Feet): (24 feet x2) Assistive device: Rolling walker (2 wheeled) General Gait Details: decreased B LE step length (vc's to take longer steps), mild L lateral lean with fatigue, L LE slightly adducted requiring vc's for wider BOS; assist  to navigate around obstacles on L side with RW Gait velocity: decreased    Posture / Balance Dynamic Sitting Balance Sitting balance - Comments: pt noted with L lateral lean sitting in chair Balance Overall balance assessment: Needs assistance Sitting-balance support: No upper extremity supported, Feet supported Sitting balance-Leahy Scale: Fair Sitting balance - Comments: pt noted with L lateral lean sitting in chair Postural control: Left lateral lean, Posterior lean Standing balance support: Bilateral upper extremity supported Standing balance-Leahy Scale: Poor Standing balance comment: slight posterior lean initially in standing requiring min assist to shift weight forward with vc's; B UE support on RW    Special needs/care consideration BiPAP/CPAP : no CPM : no Continuous Drip IV : clindamycin  Dialysis : no        Days : no Life Vest : no Oxygen : yes, 1L [place] Special Bed : no Trach Size : no Wound Vac (area) : no      Location : no Skin: Left arm skin tear per family; charted abrasion to Bilateral arms, ecchymosis to elbows, MASD arm.                         Bowel mgmt: last BM: 05/26/19 incontinent Bladder mgmt: incontinent Diabetic mgmt: no Behavioral consideration : no, due to some confusion during early hospital stay, he has been allowed a visits (has been granddaughter Press photographer) Chemo/radiation : no   Previous Environmental health practitioner (from acute  therapy documentation) Living Arrangements: Alone Available Help at Discharge: Family, Available 24 hours/day(family willing to coordinate 24/7 care as needed at discharge) Type of Home: House Home Layout: Two level, 1/2 bath on main level, Bed/bath upstairs Home Access: Stairs to enter Entrance Stairs-Rails: None Entrance Stairs-Number of Steps: 2 steps in back Bathroom Shower/Tub: Walk-in shower, Charity fundraiser: [place]: No  Discharge Living Setting Plans for Discharge Living Setting: Patient's home, Other (Comment)(will have 24/7 from family) Type of Home at Discharge: House Discharge Home Layout: Multi-level Alternate Level Stairs-Rails: Can reach both(family plans to install additional rail to allow for [place] re[place]) Alternate Level Stairs-Number of Steps: 5 steps, then landing, then 9. (one side) family plans to put in rail on other side prior to DC home.  Discharge Home Access: Stairs to enter Entrance Stairs-Rails: None (family plans to put in rails prior to home DC).  Entrance Stairs-Number of Steps: 3 Discharge Bathroom Shower/Tub: Walk-in shower Discharge Bathroom Toilet: Standard Discharge Bathroom Accessibility: Yes How Accessible: Accessible via walker Does the patient have any problems obtaining your medications?: No  Social/Family/Support Systems Patient Roles: Other (Comment)(Independent; lots of family nearby to assist) Contact Information: Jaclyn Shaggy (granddaughter): [phone]; [name] (daughter and main contact): [phone]; daughter [name]: [phone]; [name] (daughter): [phone]; grandchild Krista Blue who is a CNA) Anticipated Caregiver: see above ^ Anticipated Caregiver's Contact Information: see above Ability/Limitations of Caregiver: Min A Caregiver Availability: 24/7 Discharge Plan Discussed with Primary Caregiver: Yes Is Caregiver In Agreement with Plan?: Yes Does Caregiver/Family have Issues with Lodging/Transportation  while Pt is in Rehab?: No  Goals/Additional Needs Patient/Family Goal for Rehab: PT/OT/SLP: Supervision Expected length of stay: 8-12 days Cultural Considerations: Catholic Dietary Needs: DYS 3, nectar thick liquids; room service with assist. (extra gracy on meats, potatoes, cream soups at meals, no straws. Equipment Needs: TBD Pt/Family Agrees to Admission and willing to participate: Yes Program Orientation Provided & Reviewed with Pt/Caregiver Including Roles  & Responsibilities: Yes(pt, Norlene Duel)  Barriers to Discharge: Home environment access/layout, New  oxygen  Barriers to Discharge Comments: bedroom/fullbath on 2nd story; will need to navigate steps. Will ahve 24/7 A as needed  Decrease burden of Care through IP rehab admission: No  Possible need for SNF placement upon discharge:  Not anticipated; pt has good social support at DC with good prognosis for further progress through CIR.   Patient Condition: I have reviewed medical records from [facility], spoken with CM, and patient, daughter and family member. I discussed via phone for inpatient rehabilitation assessment.  Patient will benefit from ongoing PT, OT and SLP, can actively participate in 3 hours of therapy a day 5 days of the week, and can make measurable gains during the admission.  Patient will also benefit from the coordinated team approach during an Inpatient Acute Rehabilitation admission.  The patient will receive intensive therapy as well as Rehabilitation physician, nursing, social worker, and care management interventions.  Due to bladder management, bowel management, safety, skin/wound care, disease management, medication administration, pain management and patient education the patient requires 24 hour a day rehabilitation nursing.  The patient is currently Min/Mod A with mobility and basic ADLs.  Discharge setting and therapy post discharge at home with home health is anticipated.   Patient has agreed to participate in the Acute Inpatient Rehabilitation Program and will admit 05/27/19.  Preadmission Screen Completed By:  Jhonnie Garner, 05/27/2019 9:40 AM ______________________________________________________________________   Discussed status with Dr. Naaman Plummer on 05/27/19 at 9:40AM and received approval for admission today.  Admission Coordinator:  Jhonnie Garner, OT, time 9:40AM/Date 05/27/19   Assessment/Plan: Diagnosis: right MCA infarct 1. Does the need for close, 24 hr/day Medical supervision in concert with the patient's rehab needs make it unreasonable for this patient to be served in a less intensive setting? Yes 2. Co-Morbidities requiring supervision/potential complications: CKD, htn 3. Due to bladder management, bowel management, safety, skin/wound care, disease management, medication administration, pain management and patient education, does the patient require 24 hr/day rehab nursing? Yes 4. Does the patient require coordinated care of a physician, rehab nurse, PT (1-2 hrs/day, 5 days/week), OT (1-2 hrs/day, 5 days/week) and SLP (1-2 hrs/day, 5 days/week) to address physical and functional deficits in the context of the above medical diagnosis(es)? Yes Addressing deficits in the following areas: balance, endurance, locomotion, strength, transferring, bowel/bladder control, bathing, dressing, feeding, grooming, toileting, cognition, speech and psychosocial support 5. Can the patient actively participate in an intensive therapy program of at least 3 hrs of therapy 5 days a week? Yes 6. The potential for patient to make measurable gains while on inpatient rehab is excellent 7. Anticipated functional outcomes upon discharge from inpatients are: supervision PT, supervision OT, supervision SLP 8. Estimated rehab length of stay to reach the above functional goals is: 8-12 days 9. Anticipated D/C setting: Home 10. Anticipated post D/C treatments: [place] therapy 11. Overall  Rehab/Functional Prognosis: excellent  MD Signature: Meredith Staggers, MD, [facility] 05/27/2019         Revision History Date/Time User Provider Type Action  05/27/2019 10:16 AM Meredith Staggers, MD Physician Sign  05/27/2019 9:41 AM Jhonnie Garner, OT Rehab Admission Coordinator Share  View Details Report

## 2019-05-27 NOTE — Progress Notes (Signed)
[facility] arranged, will call back when dispatched. Madlyn Frankel, RN

## 2019-05-27 NOTE — Progress Notes (Signed)
Patient discharged with [facility] to CIR. Madlyn Frankel, RN

## 2019-05-27 NOTE — Progress Notes (Signed)
Reconsulted SLP due to patient coughing repeatedly with breakfast. Patient given Eliquis with nectar orange juice with no cough. Given a bite of cereal with reminders to chew with no cough. Given a bite with no reminders, premature swallow and patient immediately coughed repeatedly.   SLP notified. Madlyn Frankel, RN

## 2019-05-27 NOTE — Progress Notes (Signed)
Report called to [facility] at [facility]. Madlyn Frankel, RN

## 2019-05-27 NOTE — Progress Notes (Signed)
Patient ID: Jose Castro, male   DOB: 07-30-1926, 85 y.o.   MRN: [id number] Patient admitted to 4W01 via stretcher from [facility].  Patient and granddaughter verbalized understanding of rehab process including fall prevention policy, personal belongings policy, and visitation policy.  Granddaughter has been allowed to stay with patient tonight and will reevaluate need for her to stay longer tomorrow.  Patient appears to be in no immediate distress at this time.  Brita Romp, RN

## 2019-05-27 NOTE — TOC Transition Note (Signed)
Transition of Care [facility]) - CM/SW Discharge Note   Patient Details  Name: Jose Castro MRN: [id number] Date of Birth: January 20, 1926  [facility] CM/SW Contact:  Latanya Maudlin, RN Phone Number: 05/27/2019, 10:07 AM   Clinical Narrative:  TOC team notified by CIR that they have an available bed for rehab. Completed transfer packet, will use [facility]. Notified MD to place dc order and dc summary needed. Bedside RN to arrange transport time via [facility].    Final next level of care: Acute to Acute Transfer Barriers to Discharge: No Barriers Identified   Patient Goals and CMS Choice Patient states their goals for this hospitalization and ongoing recovery are:: Hope for patient to be able to go to [facility] for rehab and then be able to return home to independent living. CMS Medicare.gov Compare Post Acute Care list provided to:: Patient Choice offered to / list presented to : Patient  Discharge Placement                Patient to be transferred to facility by: [place]      Discharge Plan and Services   Discharge Planning Services: CM Consult Post Acute Care Choice: [place]                               Social Determinants of Health (SDOH) Interventions     Readmission Risk Interventions No flowsheet data found.

## 2019-05-27 NOTE — Discharge Summary (Signed)
[facility] NAME: Jose Castro    MR#:  [id number]  DATE OF BIRTH:  Oct 03, 1926  DATE OF ADMISSION:  05/21/2019   ADMITTING PHYSICIAN: Christel Mormon, MD  DATE OF DISCHARGE: No discharge date for patient encounter.  PRIMARY CARE PHYSICIAN: Steele Sizer, MD   ADMISSION DIAGNOSIS:   Stroke ([place]) [I63.9] Hypoxia [R09.02] Stroke, acute, embolic ([place]) [M01.0] Atrial flutter, unspecified type ([place]) [I48.92]  DISCHARGE DIAGNOSIS:   Active Problems:   Stroke, acute, embolic ([place])   Goals of care, counseling/discussion   Palliative care by specialist   DNR (do not resuscitate) discussion   Acute ischemic right MCA stroke ([place])   SECONDARY DIAGNOSIS:   Past Medical History:  Diagnosis Date  . Arthritis   . BPH (benign prostatic hyperplasia)   . Chronic kidney disease    had a kidney stone which per family was a cyst that was removed  . Dysrhythmia   . GERD (gastroesophageal reflux disease)   . History of kidney stones   . Hyperlipidemia   . Hypertension   . Splenic vein thrombosis   . Stroke ([place]) 05/22/2019  . Thrombosis 12/2015   mural  area and no notation of heart attack   HOSPITAL COURSE:   85 y.o. 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  Patient was found to have right ICA and right MCA territory acute infarction.  1. Acute right MCA stroke -clinical presentation suggestive of LVO likely cardioembolic in a patient with new onset afib? sick sinus s/p pacemaker. - Residual speech deficit , left sided weakness with eye opening apraxia - Unable to obtain MRI brain due to pacemaker - Repeat CT head showed evolution of right MCA stroke with edema - Continue Eliquis 2.5 mg BID - Rosuvastatin to keep low density lipoprotein (LDL)  <70 mg/dl - Discharge to inpatient Rehab for continued PT/OT/Speech - Neurology follow up as scheduled - HTN,HLD, AFIB risk factor management as below  2. New onset atrial fibrillation - - Start metoprolol 25 mg XL  -  Eliquis as above *- Cardiology follow up  3. Aspiration pneumonia - Treated with clindamycin IV will switch to po x 1 day - Repeat chest xray mild left base subsegmental atelectasis - Aspiration precautions  4. Elevated troponin-likely demand supply ischemia in the setting of acute stroke - Resolved  5.  HLD  - Recent LDL 115 + Goal LDL<70 - Rosuvastatin 40mg  PO qhs  6. HTN  + Goal BP <140/90 - Beta-blocker: will start Metoprolol as above - ACE-Inhibitor: Holding Lisinopril due to decreased renal functions  7. CKD - Continue to monitor renal functions.  DISCHARGE CONDITIONS:   stable CONSULTS OBTAINED:   Treatment Team:  Isaias Cowman, MD Neville Route, MD Alexis Goodell, MD [facility]  DRUG ALLERGIES:   Allergies  Allergen Reactions  . Penicillins Itching and Swelling    Has patient had a PCN reaction causing immediate rash, facial/tongue/throat swelling, SOB or lightheadedness with hypotension: No Has patient had a PCN reaction causing severe rash involving mucus membranes or skin necrosis: No Has patient had a PCN reaction that required hospitalization: No Has patient had a PCN reaction occurring within the last 10 years: No If all of the above answers are "NO", then may proceed with Cephalosporin use.    DISCHARGE MEDICATIONS:  Allergies as of 05/27/2019      Reactions   Penicillins Itching, Swelling   Has patient had a PCN reaction causing immediate rash, facial/tongue/throat swelling, SOB or lightheadedness with hypotension: No Has patient had a PCN reaction causing severe rash involving mucus membranes or skin necrosis: No Has patient had a PCN reaction that required hospitalization: No Has patient had a PCN  reaction occurring within the last 10 years: No If all of the above answers are "NO", then may proceed with Cephalosporin use.      Medication List    STOP taking these medications   lisinopril 20 MG tablet Commonly known as: ZESTRIL     TAKE these medications   apixaban 2.5 MG Tabs tablet Commonly known as: ELIQUIS Take 1 tablet (2.5 mg total) by mouth 2 (two) times daily.   Cialis 5 MG tablet Generic drug: tadalafil Take 5 mg by mouth daily as needed for erectile dysfunction.   clindamycin 300 MG capsule Commonly known as: CLEOCIN Take 1 capsule (300 mg total) by mouth 4 (four) times daily for 1 day.   finasteride 5 MG tablet Commonly known as: PROSCAR Take 5 mg by mouth daily.   Fish Oil 1000 MG Caps Take 1,000 mg by mouth daily.   metoprolol succinate 25 MG 24 hr tablet Commonly known as: Toprol XL Take 1 tablet (25 mg total) by mouth daily.   pantoprazole 40 MG tablet Commonly known as: PROTONIX TAKE ONE TABLET EVERY DAY   rosuvastatin 40 MG tablet Commonly known as: CRESTOR Take 1 tablet (40 mg total) by mouth daily at 6 PM.   Vitamin D3 50 MCG (2000 UT) Tabs Take 2,000 Units by mouth daily.      DISCHARGE INSTRUCTIONS:   DIET:   Low fat, Low cholesterol diet   ACTIVITY:   Activity as tolerated  OXYGEN:   Home Oxygen: No.  Oxygen Delivery: room air  DISCHARGE LOCATION:   Inpatient Rehab   If you experience worsening of your admission symptoms, develop shortness of breath, life threatening emergency, suicidal or homicidal thoughts you must seek medical attention immediately by calling 911 or calling your MD immediately  if symptoms less severe.  You Must read complete instructions/literature along with all the possible adverse reactions/side effects for all the Medicines you take and that have been prescribed to you. Take any new Medicines after you have completely understood and accpet all the possible adverse reactions/side effects.    Please note  You were cared for by a hospitalist during your hospital stay. If you have any questions about your discharge medications or the care you received while you were in the hospital after you are discharged, you can call the unit and asked to speak with the hospitalist on call if the hospitalist that took care of you is not available. Once you are discharged, your primary care physician will handle any further medical issues. Please note that NO REFILLS for any discharge medications will be authorized once you are discharged, as it is imperative that you return to your primary care physician (or establish a relationship with a primary care physician if you do not have one) for your aftercare needs so that they can reassess your need for medications and monitor your lab values.  On the day of Discharge:  VITAL SIGNS:   Blood pressure 118/64, pulse 64, temperature 98.7 F (37.1 C), resp. rate 18, height 5\' 10"  (1.778 m), weight 82.6 kg, SpO2 95 %.  PHYSICAL EXAMINATION:  GENERAL:  85 y.o.-year-old patient lying in the bed with no acute distress.  EYES: Pupils equal, round, reactive to light and accommodation. No scleral icterus. Extraocular muscles intact.  HEENT: Head atraumatic, normocephalic. Oropharynx and nasopharynx clear.  NECK:  Supple, no jugular venous distention. No thyroid enlargement, no tenderness.  LUNGS: Normal breath sounds bilaterally, no wheezing, rales,rhonchi or crepitation. No use of accessory muscles of respiration.  CARDIOVASCULAR: S1, S2 normal. No murmurs, rubs, or gallops.  ABDOMEN: Soft, nontender, nondistended. Bowel sounds present. No organomegaly or mass.  EXTREMITIES: No pedal edema, cyanosis, or clubbing. No rash or lesions. + pedal pulses MUSCULOSKELETAL: Normal bulk, and power was 5+ grip and elbow, knee, and ankle flexion and extension bilaterally.  NEUROLOGIC: Patient awake, oriented x 3. Follows commands. Eye opening apraxia continues to  improved. Extraocular movements intact. Left homonomyous hemianopia. Left facial weakness. Dysarthria. No aphasia. Left upper extremity with 4/5 strength. Left lower extremity with 4/5 strength. Moves right side without difficulty, strength normal. Left upper extremity dysmetric. Plantars up on the right, down on the left. Sensation intact bilaterally. Gait not tested due to safety concern. PSYCHIATRIC: The patient is alert and oriented x 3.  SKIN: No obvious rash, lesion, or ulcer.   DATA REVIEW:   CBC Recent Labs  Lab 05/24/19 0434  WBC 14.8*  HGB 16.7  HCT 50.4  PLT 251    Chemistries  Recent Labs  Lab 05/23/19 1253  05/26/19 0653 05/27/19 0607  NA 141   < > 138 136  K 3.5   < > 3.3* 3.5  CL 103   < > 101 101  CO2 26   < > 26 25  GLUCOSE 121*   < > 114* 124*  BUN 42*   < > 47* 41*  CREATININE 1.35*   < > 1.12 1.16  CALCIUM 8.5*   < > 8.5* 8.3*  MG  --   --  2.2  --   AST 20  --   --   --   ALT 17  --   --   --   ALKPHOS 62  --   --   --   BILITOT 2.1*  --   --   --    < > = values in this interval not displayed.     Microbiology Results  Results for orders placed or performed during the hospital encounter of 05/21/19  SARS Coronavirus 2 (CEPHEID - Performed in [place] hospital lab), Hosp Order     Status: None   Collection Time: 05/21/19  7:26 PM   Specimen: Nasopharyngeal Swab  Result Value Ref Range Status   SARS Coronavirus 2 NEGATIVE NEGATIVE Final    Comment: (NOTE) If result is NEGATIVE SARS-CoV-2 target nucleic acids are NOT DETECTED. The SARS-CoV-2 RNA is generally detectable in upper and lower  respiratory specimens during the acute phase of infection. The lowest  concentration of SARS-CoV-2 viral copies this assay can detect is 250  copies / mL. A negative result does not preclude SARS-CoV-2 infection  and should not be used as the sole basis for treatment or other  patient management decisions.  A negative result may occur with  improper  specimen collection / handling, submission of specimen other  than nasopharyngeal swab, presence of viral mutation(s) within the  areas targeted by this assay, and inadequate number of viral copies  (<250 copies / mL). A negative result must be combined with clinical  observations, patient history, and epidemiological information. If  result is POSITIVE SARS-CoV-2 target nucleic acids are DETECTED. The SARS-CoV-2 RNA is generally detectable in upper and lower  respiratory specimens dur ing the acute phase of infection.  Positive  results are indicative of active infection with SARS-CoV-2.  Clinical  correlation with patient history and other diagnostic information is  necessary to determine patient infection status.  Positive results do  not rule out bacterial infection or co-infection with other viruses. If result is PRESUMPTIVE POSTIVE SARS-CoV-2 nucleic acids MAY BE PRESENT.   A presumptive positive result was obtained on the submitted specimen  and confirmed on repeat testing.  While 2019 novel coronavirus  (SARS-CoV-2) nucleic acids may be present in the submitted sample  additional confirmatory testing may be necessary for epidemiological  and / or clinical management purposes  to differentiate between  SARS-CoV-2 and other Sarbecovirus currently known to infect humans.  If clinically indicated additional testing with an alternate test  methodology [phone]) is advised. The SARS-CoV-2 RNA is generally  detectable in upper and lower respiratory sp ecimens during the acute  phase of infection. The expected result is Negative. Fact Sheet for Patients:  StrictlyIdeas.no Fact Sheet for Healthcare Providers: BankingDealers.co.za This test is not yet approved or cleared by the [place] FDA and has been authorized for detection and/or diagnosis of SARS-CoV-2 by FDA under an Emergency Use Authorization (EUA).  This EUA will remain in  effect (meaning this test can be used) for the duration of the COVID-19 declaration under Section 564(b)(1) of the Act, 21 U.S.C. section 360bbb-3(b)(1), unless the authorization is terminated or revoked sooner. Performed at [facility], [address] [place], [place] [address]   Blood Culture (routine x 2)     Status: None (Preliminary result)   Collection Time: 05/21/19  7:26 PM   Specimen: BLOOD  Result Value Ref Range Status   Specimen Description BLOOD RIGHT HAND  Final   Special Requests   Final    BOTTLES DRAWN AEROBIC AND ANAEROBIC Blood Culture adequate volume   Culture   Final    NO GROWTH 4 DAYS Performed at [facility], [address], [place], [place] [address]    Report Status PENDING  Incomplete  Blood Culture (routine x 2)     Status: None (Preliminary result)   Collection Time: 05/21/19  7:50 PM   Specimen: BLOOD  Result Value Ref Range Status   Specimen Description BLOOD RIGHT ANTECUBITAL  Final   Special Requests   Final    BOTTLES DRAWN AEROBIC AND ANAEROBIC Blood Culture results may not be optimal due to an excessive volume of blood received in culture bottles   Culture   Final    NO GROWTH 4 DAYS Performed at [facility], [address], [place],  [address]    Report Status PENDING  Incomplete    RADIOLOGY:  No results found.   Management plans discussed with the patient, family and they are in agreement.  CODE STATUS:     Code Status Orders  (From admission, onward)         Start     Ordered   05/22/19 0232  Full code  Continuous     05/22/19 0231        Code Status History    Date Active Date Inactive Code Status Order ID Comments User Context   07/13/2018 1512 07/14/2018 1608 Full Code [id number]  Isaias Cowman, MD Inpatient   11/27/2015 2235 11/30/2015 1607 Full Code [id number]  Lance Coon,  MD Inpatient   Advance Care Planning Activity      TOTAL TIME TAKING CARE OF THIS PATIENT: 40  minutes.     05/27/2019 at 10:58 AM   Rufina Falco, DNP, FNP-BC [facility] Nurse Practitioner Between 7am to 6pm - Pager - [phone]  After 6pm go to [email or web address] - password [email or web address] [facility]  Office  [phone]   CC: Primary care physician; Steele Sizer, MD   Note: This dictation was prepared with Dragon dictation along with smaller phrase technology. Any transcriptional errors that result from this process are unintentional.

## 2019-05-27 NOTE — Progress Notes (Signed)
Speech Language Pathology Treatment: Dysphagia  Patient Details Name: Jose Castro MRN: [id number] DOB: Dec 11, 1925 Today's Date: 05/27/2019 Time: 5009-3818 SLP Time Calculation (min) (ACUTE ONLY): 40 min  Assessment / Plan / Recommendation Clinical Impression  Pt was seen today for f/u w/ swallowing. Granddaughter present in room. Pt did not d/c to CIR yesterday but is d/t do so this morning per NSG/SW. Granddaughter reported pt has not been sleeping well at night awaking about 3am not going back to sleep. This is not going to be supportive of pt's healing process nor helpful w/ his advanced age. Pt appeared drowsy this morning; eyes closed and did not immediately awaken to verbal/tactile stim.  Once awakened and positioned more upright in bed for po's, pt was given Nectar liquids via Cup. He fed self w/ support. Intermittent, overt s/s of aspiration noted: delayed throat clearing, mild cough, multiple swallows. No decline in Pulmonary status or vocal quality apparent during/post trials. Pt was given trials of Honey consistency liquids then minced, softened foods w/ no overt deficits noted. Fairly timely A-P transfer w/ liquid boluses; min increased time w/ bolus management and mastication w/ increased texture. Pt helped to feed self but was weak in UEs.  Due to suspected delayed pharyngeal swallowing and dysphagia w/ the increased risk for aspiration and Pulmonary decline, recommend modifying pt's diet to a Dysphagia level 2(minced foods for easier mastication/oral phase management) and HONEY consistency liquids; NO MIXED CONSISTENCIES; strict aspiration precautions and Supervision and support during meals d/t overall weakness. Recommend pills in Puree for safer swallowing. Recommend objective swallow study at this time as pt's swallowing presentation has not improved; new CVA.  Education given to granddaughter present on aspiration precautions; dysphagia and dysphagia in light of advanced age,  illness, CVA; food and liquid consistencies and modifications. NSG/MD updated.      HPI   Pt is a 85 y.o. male with a known history of hypertension, GERD, hyperlipidemia, pacemaker placement d/t bradycardia, and other multiple medical issues per chart notes.  Patient presented to the emergency room after being found on the floor of his home earlier in the day.  Patient was last seen well at 1 PM on yesterday.  EMS reported left fixed eye deviation to the right.  This has improved slightly by the time I am seeing the patient.  Patient's granddaughter is at the bedside.  Patient is awake, alert, oriented to person, place, and time.  However, he does not recall events surrounding the last 24 hours.  He is somewhat of a poor historian with slight confusion at times.  Today, pt is awake(had been min lethargic s/p admission), A/O x4. He worked w/ OT during session following all commands. He appears to fatigue easily.  Pt appears min HOH.  Pt's family member endorsed pt having mild throat clearing intermittently during meals much prior to this episode but No Pulmonary decline from such.  This morning, NSG and family reported pt was eating Cereal w/ Nectar milk and eating peaches/pears w/ overt coughing and throat clearing noted. ST was consulted. Suspect pt's response could be a result of Mixed Consistencies during intake/swallowing.       SLP Plan  Continue with current plan of care       Recommendations  Diet recommendations: Dysphagia 2 (fine chop);Honey-thick liquid Liquids provided via: Cup;No straw Medication Administration: Whole meds with puree Supervision: Patient able to self feed;Staff to assist with self feeding;Full supervision/cueing for compensatory strategies Compensations: Minimize environmental distractions;Slow rate;Small sips/bites;Lingual sweep for  clearance of pocketing;Multiple dry swallows after each bite/sip;Follow solids with liquid Postural Changes and/or Swallow Maneuvers:  Seated upright 90 degrees;Upright 30-60 min after meal                General recommendations: Rehab consult(CIR today) Oral Care Recommendations: Oral care BID;Oral care before and after PO;Staff/trained caregiver to provide oral care Follow up Recommendations: Inpatient Rehab SLP Visit Diagnosis: Dysphagia, oropharyngeal phase (R13.12) Plan: Continue with current plan of care       Jose Castro, [place], CCC-SLP Jose Castro 05/27/2019, 2:16 PM

## 2019-05-27 NOTE — Care Management Important Message (Signed)
Important Message  Patient Details  Name: Jose Castro MRN: [id number] Date of Birth: 05/27/1926   Medicare Important Message Given:  Yes     [name] A Wandalene Abrams 05/27/2019, 11:25 AM

## 2019-05-27 NOTE — Progress Notes (Signed)
Inpatient Rehabilitation-Admissions Coordinator   Pt and family still wanting to pursue CIR. [facility] has received insurance approval and per CM Josh, Pt has medical clearance for DC.  AC will accept this pt today to CIR.   Please call if questions.   Jhonnie Garner, OTR/L  Rehab Admissions Coordinator  [phone] 05/27/2019 10:29 AM

## 2019-05-27 NOTE — H&P (Addendum)
Physical Medicine and Rehabilitation Admission H&P        Chief Complaint  Patient presents with  . Fall  : HPI: Jose Castro is a 85 year old right-handed male with history of hypertension, hyperlipidemia, CKD stage II with creatinine 1.28-1.35, dysrhythmia with heart block status post pacemaker placement August 2019 followed by Dr Clayborn Bigness.  Per chart review patient lives alone.  Independent in all aspects of driving and ADLs.  2 level home with half bath on main level bed and bathroom upstairs.  Family plans to provide 24/7 assistance as needed at discharge.  Presented 05/22/2019 after being found down on the floor at home with reported left fixed eye deviation to the right.   WBC 25,400, urinalysis negative, COVID negative, CK 970, blood cultures no growth to date.  Chest x-ray no focal infiltrate.  Mild left base subsegmental atelectasis.  Cranial CT scan showed multiple areas of acute nonhemorrhagic infarct right hemisphere.  MRI not completed due to pacemaker.  Echocardiogram with ejection fraction of 22% normal systolic function.  Hospital course atrial fibrillation with cardiology services consulted borderline troponin  0.05 felt to be related to demand ischemia.  Patient initially on aspirin for CVA prophylaxis and changed to Eliquis 05/26/2019.    Dysphasia #2 honey thick liquids. WBC improved 14,800 and afebrile. Therapy evaluations completed and patient was admitted for a comprehensive rehab program. He had no problems overnight. Family at bedside   Review of Systems  Constitutional: Negative for chills and fever.  HENT: Negative for hearing loss.   Eyes: Negative for blurred vision and double vision.  Respiratory:+ for dry cough   Cardiovascular: Positive for palpitations and leg swelling. Negative for chest pain.  Gastrointestinal: Positive for constipation. Negative for heartburn, nausea and vomiting.       GERD  Genitourinary: Positive for urgency.  Musculoskeletal:  Positive for joint pain and myalgias.  Skin: Negative for rash.  Neurological: Positive for focal weakness.  All other systems reviewed and are negative.       Past Medical History:  Diagnosis Date  . Arthritis    . BPH (benign prostatic hyperplasia)    . Chronic kidney disease      had a kidney stone which per family was a cyst that was removed  . Dysrhythmia    . GERD (gastroesophageal reflux disease)    . History of kidney stones    . Hyperlipidemia    . Hypertension    . Splenic vein thrombosis    . Stroke ([place]) 05/22/2019  . Thrombosis 12/2015    mural  area and no notation of heart attack        Past Surgical History:  Procedure Laterality Date  . APPENDECTOMY      . CATARACT EXTRACTION, BILATERAL      . CYSTOSCOPY WITH INSERTION OF UROLIFT      . KIDNEY STONE SURGERY      . KNEE SURGERY      . PACEMAKER INSERTION N/A 07/13/2018    Procedure: INSERTION PACEMAKER-DUEL CHAMBER INITIAL IMPLANT;  Surgeon: Isaias Cowman, MD;  Location: [facility] ORS;  Service: Cardiovascular;  Laterality: N/A;  . SPINE SURGERY            Family History  Problem Relation Age of Onset  . Cancer Brother          bladder cancer with mets  . Heart disease Mother    . Aortic aneurysm Mother    . Heart attack Maternal Aunt    .  Heart attack Maternal Uncle     Social History:  reports that he quit smoking about 58 years ago. His smoking use included cigarettes. He started smoking about 74 years ago. He quit after 16.00 years of use. He has never used smokeless tobacco. He reports current alcohol use of about 3.0 standard drinks of alcohol per week. He reports that he does not use drugs. Allergies:       Allergies  Allergen Reactions  . Penicillins Itching and Swelling      Has patient had a PCN reaction causing immediate rash, facial/tongue/throat swelling, SOB or lightheadedness with hypotension: No Has patient had a PCN reaction causing severe rash involving mucus membranes or skin  necrosis: No Has patient had a PCN reaction that required hospitalization: No Has patient had a PCN reaction occurring within the last 10 years: No If all of the above answers are "NO", then may proceed with Cephalosporin use.           Medications Prior to Admission  Medication Sig Dispense Refill  . Cholecalciferol (VITAMIN D3) 2000 units TABS Take 2,000 Units by mouth daily.       Marland Kitchen CIALIS 5 MG tablet Take 5 mg by mouth daily as needed for erectile dysfunction.       . finasteride (PROSCAR) 5 MG tablet Take 5 mg by mouth daily.       Marland Kitchen lisinopril (PRINIVIL,ZESTRIL) 20 MG tablet Take 1 tablet by mouth daily.      . Omega-3 Fatty Acids (FISH OIL) 1000 MG CAPS Take 1,000 mg by mouth daily.       . pantoprazole (PROTONIX) 40 MG tablet TAKE ONE TABLET EVERY DAY 90 tablet 1     Drug Regimen Review Drug regimen was reviewed and remains appropriate with no significant issues identified   Home: Home Living Family/patient expects to be discharged to:: Private residence Living Arrangements: Alone Available Help at Discharge: Family, Available 24 hours/day(family willing to coordinate 24/7 care as needed at discharge) Type of Home: House Home Access: Stairs to enter [employer] of Steps: 2 steps in back Entrance Stairs-Rails: None Home Layout: Two level, 1/2 bath on main level, Bed/bath upstairs Bathroom Shower/Tub: Gaffer, Door [employer] Toilet: Standard Home Equipment: None   Functional History: Prior Function Level of Independence: Independent Comments: Pt indep in all aspects, driving, and no additional falls.   Functional Status:  Mobility: Bed Mobility Overal bed mobility: Needs Assistance Bed Mobility: Supine to Sit Supine to sit: Min assist, Mod assist Sit to supine: Min assist, +2 for physical assistance General bed mobility comments: Deferred (pt sitting in recliner at beginning/end of session) Transfers Overall transfer level: Needs assistance  Equipment used: Rolling walker (2 wheeled) Transfers: Sit to/from Stand Sit to Stand: Min assist, Mod assist General transfer comment: min to mod assist x1 1st trial standing; min assist x1 2nd trial standing; vc's to shift weight forward when standing, for UE/LE placement, and assist to initiate stand (and assist for initial standing balance d/t mild posterior lean) Ambulation/Gait Ambulation/Gait assistance: Min assist, Mod assist Gait Distance (Feet): (24 feet x2) Assistive device: Rolling walker (2 wheeled) General Gait Details: decreased B LE step length (vc's to take longer steps), mild L lateral lean with fatigue, L LE slightly adducted requiring vc's for wider BOS; assist to navigate around obstacles on L side with RW Gait velocity: decreased     ADL: ADL Overall ADL's : Needs assistance/impaired Eating/Feeding: NPO Eating/Feeding Details (indicate cue type and reason): SLP  in at end of session to further assess, but pt does not appear to have difficulty with [facility] tasks Upper Body Bathing: Sitting, Minimal assistance Lower Body Bathing: Sit to/from stand, Minimal assistance, Moderate assistance Lower Body Bathing Details (indicate cue type and reason): increased assist required once in standing 2/2 decreased balance Upper Body Dressing : Sitting, Minimal assistance Lower Body Dressing: Sit to/from stand, Minimal assistance, Moderate assistance Lower Body Dressing Details (indicate cue type and reason): increased assist required once in standing 2/2 decreased balance Toilet Transfer: BSC, RW, Minimal assistance, Cueing for safety, Ambulation Toilet Transfer Details (indicate cue type and reason): posterior lean   Cognition: Cognition Overall Cognitive Status: Within Functional Limits for tasks assessed Orientation Level: Oriented X4 Cognition Arousal/Alertness: Awake/alert Behavior During Therapy: WFL for tasks assessed/performed Overall Cognitive Status: Within Functional  Limits for tasks assessed General Comments: L inattention   Physical Exam: Blood pressure (!) 125/57, pulse 60, temperature 99.8 F (37.7 C), temperature source Oral, resp. rate 18, height 5\' 10"  (1.778 m), weight 82.6 kg, SpO2 100 %. Physical Exam  Neurological:   General:   No apparent distress HEENT: Head is normocephalic, atraumatic, PERRLA, EOMI, sclera anicteric, oral mucosa pink and moist, dentition intact, ext ear canals clear,  Neck: Supple without JVD or lymphadenopathy Heart: Reg rate and rhythm. No murmurs rubs or gallops Chest: CTA bilaterally without wheezes, rales, or rhonchi; no distress Abdomen: Soft, non-tender, non-distended, bowel sounds positive. Extremities: No clubbing, cyanosis, or edema. Pulses are 2+ Skin: Clean and intact without signs of breakdown Neuro: pt alert and oriented to person, place. Word finding deficits with apraxia. Comprehension appears generally intact. May have field deficit to right but difficult to say. Right central 7.. Sensses pain in all 4's. .LUE and LLE 4 to 4+/5. RUE and RLE 5/5. Musculoskeletal: Full ROM, No pain with AROM or PROM in the neck, trunk, or extremities. Posture appropriate Psych: Pt's affect is appropriate. Pt is cooperative        Lab Results Last 48 Hours        Results for orders placed or performed during the hospital encounter of 05/21/19 (from the past 48 hour(s))  Basic metabolic panel     Status: Abnormal    Collection Time: 05/26/19  6:53 AM  Result Value Ref Range    Sodium 138 135 - 145 mmol/L    Potassium 3.3 (L) 3.5 - 5.1 mmol/L    Chloride 101 98 - 111 mmol/L    CO2 26 22 - 32 mmol/L    Glucose, Bld 114 (H) 70 - 99 mg/dL    BUN 47 (H) 8 - 23 mg/dL    Creatinine, Ser 1.12 0.61 - 1.24 mg/dL    Calcium 8.5 (L) 8.9 - 10.3 mg/dL    GFR calc non Af Amer 58 (L) >60 mL/min    GFR calc Af Amer >60 >60 mL/min    Anion gap 11 5 - 15      Comment: Performed at [facility], [address],  [place], [place] [address]  Magnesium     Status: None    Collection Time: 05/26/19  6:53 AM  Result Value Ref Range    Magnesium 2.2 1.7 - 2.4 mg/dL      Comment: Performed at [facility], [address] [place], [place] [address]  Basic metabolic panel     Status: Abnormal    Collection Time: 05/27/19  6:07 AM  Result Value Ref Range    Sodium 136  135 - 145 mmol/L    Potassium 3.5 3.5 - 5.1 mmol/L    Chloride 101 98 - 111 mmol/L    CO2 25 22 - 32 mmol/L    Glucose, Bld 124 (H) 70 - 99 mg/dL    BUN 41 (H) 8 - 23 mg/dL    Creatinine, Ser 1.16 0.61 - 1.24 mg/dL    Calcium 8.3 (L) 8.9 - 10.3 mg/dL    GFR calc non Af Amer 55 (L) >60 mL/min    GFR calc Af Amer >60 >60 mL/min    Anion gap 10 5 - 15      Comment: Performed at [facility], [address] [place], [place] [address]     Imaging Results (Last 48 hours)  No results found.           Medical Problem List and Plan: 1.  Dysarthria, right gaze deviation with decreased functional mobility secondary to right MCA infarction  --Patient is beginning CIR therapies today including PT, OT, and SLP  2.  Antithrombotics: -DVT/anticoagulation: Eliquis             -antiplatelet therapy: N/A 3. Pain Management: Tylenol as needed 4. Mood: Provide emotional support             -antipsychotic agents: N/A 5. Neuropsych: This patient is capable of making decisions on his own behalf. 6. Skin/Wound Care: Routine skin checks 7. Fluids/Electrolytes/Nutrition: BUN still a little elevated  -push fluids 8.  Dysphagia.  Dysphasia #2 Honey liquids.  Follow-up speech therapy 9.  Complete heart block.  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   10.  New onset atrial fibrillation.   Continue Eliquis as well as initiation of Toprol-XL 25 mg daily  -HR controlled at present 11.  Hyperlipidemia.  Crestor 12.  BPH.  Proscar 5 mg daily 13.  Permissive hypertension.  Patient on lisinopril 20 mg daily prior to admission   -BP controlled  14.  GERD.  Protonix 15.Leukocytosis .Improved with BC negative,Urinalysis negative nitrite, chest x-ray with no infiltrate 16.  CKD.  Baseline creatinine 1.28-1.49. recently 1.16       Post Admission Physician Evaluation: 1. Functional deficits secondary  to right MCA infarct. 2. Patient is admitted to receive collaborative, interdisciplinary care between the physiatrist, rehab nursing staff, and therapy team. 3. Patient's level of medical complexity and substantial therapy needs in context of that medical necessity cannot be provided at a lesser intensity of care such as a SNF. 4. Patient has experienced substantial functional loss from his/her baseline which was documented above under the "Functional History" and "Functional Status" headings.  Judging by the patient's diagnosis, physical exam, and functional history, the patient has potential for functional progress which will result in measurable gains while on inpatient rehab.  These gains will be of substantial and practical use upon discharge  in facilitating mobility and self-care at the household level. 5. Physiatrist will provide 24 hour management of medical needs as well as oversight of the therapy plan/treatment and provide guidance as appropriate regarding the interaction of the two. 6. The Preadmission Screening has been reviewed and patient status is unchanged unless otherwise stated above. 7. 24 hour rehab nursing will assist with bladder management, bowel management, safety, skin/wound care, disease management, medication administration and patient education  and help integrate therapy concepts, techniques,education, etc. 8. PT will assess and treat for/with: Lower extremity strength, range of motion, stamina, balance, functional mobility, safety, adaptive techniques and equipment, NMR, visual-perceptual rx.   Goals  are: supervision. 9. OT will assess and treat for/with: ADL's, functional mobility, safety, upper extremity strength,  adaptive techniques and equipment, NMR, family ed.   Goals are: supervision. Therapy may proceed with showering this patient. 10. SLP will assess and treat for/with: cognition, communication.  Goals are: supervision. 11. Case Management and Social Worker will assess and treat for psychological issues and discharge planning. 12. Team conference will be held weekly to assess progress toward goals and to determine barriers to discharge. 13. Patient will receive at least 3 hours of therapy per day at least 5 days per week. 14. ELOS: 8-12 days       15. Prognosis:  excellent   I have personally performed a face to face diagnostic evaluation of this patient and formulated the key components of the plan.  Additionally, I have personally reviewed laboratory data, imaging studies, as well as relevant notes and concur with the physician assistant's documentation above.  Meredith Staggers, MD, FAAPMR     Lavon Paganini Pisgah, PA-C 05/27/2019

## 2019-05-28 ENCOUNTER — Inpatient Hospital Stay (HOSPITAL_COMMUNITY): Payer: Medicare Other | Admitting: Physical Therapy

## 2019-05-28 ENCOUNTER — Inpatient Hospital Stay (HOSPITAL_COMMUNITY): Payer: Medicare Other | Admitting: Speech Pathology

## 2019-05-28 ENCOUNTER — Inpatient Hospital Stay (HOSPITAL_COMMUNITY): Payer: Medicare Other | Admitting: Occupational Therapy

## 2019-05-28 MED ORDER — ORAL CARE MOUTH RINSE
15.0000 mL | Freq: Two times a day (BID) | OROMUCOSAL | Status: DC
  Administered 2019-05-28 – 2019-06-08 (×21): 15 mL via OROMUCOSAL

## 2019-05-28 MED ORDER — GUAIFENESIN-DM 100-10 MG/5ML PO SYRP: 5.0000 mL | ORAL_SOLUTION | ORAL | Status: DC | PRN

## 2019-05-28 NOTE — Evaluation (Signed)
Occupational Therapy Assessment and Plan  Patient Details  Name: Jose Castro MRN: [id number] Date of Birth: [birth year]  OT Diagnosis: cognitive deficits, disturbance of vision and hemiplegia affecting non-dominant side Rehab Potential: Rehab Potential (ACUTE ONLY): Good ELOS: ~10-14 days   Today's Date: 05/28/2019 OT Individual Time: 0900-1000 OT Individual Time Calculation (min): 60 min     Problem List:  Patient Active Problem List   Diagnosis Date Noted  . Right middle cerebral artery stroke ([place]) 05/27/2019  . Acute ischemic right MCA stroke ([place]) 05/25/2019  . Goals of care, counseling/discussion   . Palliative care by specialist   . DNR (do not resuscitate) discussion   . Stroke, acute, embolic ([place]) 62/83/6629  . Persistent proteinuria 07/30/2018  . 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 Chronic kidney disease, stage III (moderate) ([place]) 06/15/2018  . Leg pain 05/06/2018  . Lymphedema 05/06/2018  . Coagulopathy ([place]) 07/07/2017  . Lumbar spondylosis 03/10/2017  . Elevated uric acid in blood 11/20/2016  . BPH (benign prostatic hyperplasia) 05/29/2016  . Peripheral vascular disease of lower extremity ([place]) 03/03/2016  . Gallstone 12/31/2015  . 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 Atherosclerosis of aorta ([place]) 11/22/2015  . Carotid artery narrowing 11/22/2015  . Diverticulosis of colon 11/22/2015  . Decreased creatinine clearance 11/22/2015  . Arthritis, degenerative 11/22/2015  . Lactose intolerance 11/22/2015  . Basal cell carcinoma 11/22/2015  . Essential hypertension 06/21/2015  . Hyperlipemia 06/21/2015  . GERD (gastroesophageal reflux disease) 06/21/2015  . Calculus of kidney 11/18/2013    Past Medical History:  Past Medical History:  Diagnosis Date  . Arthritis   . BPH (benign prostatic hyperplasia)   . Chronic kidney disease    had a kidney stone which per family was a cyst that was removed  .  Dysrhythmia   . GERD (gastroesophageal reflux disease)   . History of kidney stones   . Hyperlipidemia   . Hypertension   . Splenic vein thrombosis   . Stroke ([place]) 05/22/2019  . Thrombosis 12/2015   mural  area and no notation of heart attack   Past Surgical History:  Past Surgical History:  Procedure Laterality Date  . APPENDECTOMY    . CATARACT EXTRACTION, BILATERAL    . CYSTOSCOPY WITH INSERTION OF UROLIFT    . KIDNEY STONE SURGERY    . KNEE SURGERY    . PACEMAKER INSERTION N/A 07/13/2018   Procedure: INSERTION PACEMAKER-DUEL CHAMBER INITIAL IMPLANT;  Surgeon: Isaias Cowman, MD;  Location: [facility] ORS;  Service: Cardiovascular;  Laterality: N/A;  . SPINE SURGERY      Assessment & Plan Clinical Impression: Patient is a 85 y.o. year old male right-handed male with history of hypertension, hyperlipidemia, CKD stage II with creatinine 1.28-1.35, dysrhythmia with heart block status post pacemaker placement August 2019 followed by [facility]. Per chart review patient lives alone. Independent in all aspects of driving and ADLs. 2 level home with half bath on main level bed and bathroom upstairs. Family plans to provide 24/7 assistance as needed at discharge. Presented 05/22/2019 after being found down on the floor at home with reported left fixed eye deviation to the right. WBC 25,400, urinalysis negative, COVID negative, CK 970, blood cultures no growth to date.Chest x-ray no focal infiltrate. Mild left base subsegmental atelectasis. Cranial CT scan showed multiple areas of acute nonhemorrhagic infarct right hemisphere.MRI not completed due to pacemaker. Echocardiogram with ejection fraction of 47% normal systolic function. Hospital course atrial fibrillation  with cardiology services consulted borderline troponin 0.29flt to be related to demand ischemia. Patient initially on aspirin for CVA prophylaxis and changed to Eliquis 05/26/2019.Dysphasia #2 honeythick liquids.  WBC improved 14,800 a  Patient transferred to CIR on 05/27/2019 .    Patient currently requires mod with basic self-care skills and min- mod A for mobility and balance secondary to muscle weakness, decreased cardiorespiratoy endurance, impaired timing and sequencing, abnormal tone, unbalanced muscle activation and decreased coordination, decreased attention, decreased awareness, decreased problem solving, decreased safety awareness and decreased memory and decreased sitting balance, decreased standing balance, hemiplegia and decreased balance strategies.  Prior to hospitalization, patient could complete ADL with independent .  Patient will benefit from skilled intervention to decrease level of assist with basic self-care skills and increase independence with basic self-care skills prior to discharge home with care partner.  Anticipate patient will require 24 hour supervision and follow up home health.  OT - End of Session Activity Tolerance: Tolerates 30+ min activity with multiple rests Endurance Deficit: Yes OT Assessment Rehab Potential (ACUTE ONLY): Good OT Patient demonstrates impairments in the following area(s): Balance;Cognition;Endurance;Safety;Pain;Motor OT Basic ADL's Functional Problem(s): Grooming;Bathing;Dressing;Toileting OT Transfers Functional Problem(s): Toilet;Tub/Shower OT Additional Impairment(s): Fuctional Use of Upper Extremity OT Plan OT Intensity: Minimum of 1-2 x/day, 45 to 90 minutes OT Frequency: 5 out of 7 days OT Duration/Estimated Length of Stay: ~10-14 days OT Treatment/Interventions: Balance/vestibular training;Disease mangement/prevention;Neuromuscular re-education;Self Care/advanced ADL retraining;Therapeutic Exercise;Wheelchair propulsion/positioning;Cognitive remediation/compensation;DME/adaptive equipment instruction;Pain management;Skin care/wound managment;UE/LE Strength taining/ROM;Community reintegration;Patient/family education;Splinting/orthotics;UE/LE  Coordination activities;Discharge planning;Functional mobility training;Psychosocial support;Therapeutic Activities OT Self Feeding Anticipated Outcome(s): supervision OT Basic Self-Care Anticipated Outcome(s): supervision OT Toileting Anticipated Outcome(s): supervision OT Bathroom Transfers Anticipated Outcome(s): supervision OT Recommendation Recommendations for Other Services: Neuropsych consult Patient destination: Home Follow Up Recommendations: 24 hour supervision/assistance;Outpatient OT Equipment Recommended: Tub/shower seat   Skilled Therapeutic Intervention 1:1 Ot eval initiated with pt with granddaughter present. She reports her mother lives a few minutes from pt. 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 Ot goals, purpose and role discussed. Self care retraining at shower level and dressing at sink. Pt performed bed mobility to EOB with min A with extra time. Mod A transfer to the w/c and min to the toilet. Able to ambulate a few steps with min to mod A to the shower. Showered sit to stands with grab bar. Pt required A for functional problem solving and at times attention to task. Pt with decr recall. Pt ambulated with min A at a very slow pace with min to mod A to the w/c at the sink from the shower. Pt required more than reasonable amt of time to orient clothing and still required extra time to don- mod A for shirt. Pt with noted decr coordination with left hand and visual attention to left field requiring mod cues. Decreased awareness of deficits present. Left sitting in recliner (min A transfer) with granddaughter present  OT Evaluation Precautions/Restrictions  Precautions Precautions: Fall Precaution Comments: swallowing Restrictions Weight Bearing Restrictions: No General Chart Reviewed: Yes Family/Caregiver Present: Yes(granddaughter) Vital Signs Therapy Vitals Temp: 98.1 F (36.7 C) Temp Source: Oral Pulse Rate: (!) 59 BP:  105/69 Oxygen Therapy SpO2: 95 % O2 Device: Room Air Pain Pain Assessment Pain Scale: 0-10 Pain Score: 6  Pain Type: Acute pain Pain Location: Toe (Comment which one)((R) Great Toe) Pain Orientation: Right Pain Descriptors / Indicators: Aching;Discomfort Pain Onset: On-going Pain Intervention(s): Emotional support Home Living/Prior Functioning Home Living Family/patient expects  to be discharged to:: Private residence Living Arrangements: Alone Available Help at Discharge: Family Type of Home: House Home Access: Stairs to enter [employer] of Steps: 2 steps in back [place]: Two level, 1/2 bath on main level, Bed/bath upstairs Bathroom Shower/Tub: Gaffer, Door [employer] Toilet: Standard  Lives With: Alone Prior Function Vocation: Retired ADL ADL Grooming: Moderate assistance Where Assessed-Grooming: Sitting at sink Upper Body Bathing: Supervision/safety, Setup Where Assessed-Upper Body Bathing: Shower Lower Body Bathing: Minimal assistance Where Assessed-Lower Body Bathing: Shower Upper Body Dressing: Moderate assistance(with more than reasonable amt of time) Where Assessed-Upper Body Dressing: Wheelchair, Sitting at sink Lower Body Dressing: Moderate assistance Where Assessed-Lower Body Dressing: Sitting at sink, Wheelchair Toileting: Minimal assistance Where Assessed-Toileting: Glass blower/designer: Minimal assistance, Moderate assistance Toilet Transfer Method: Counselling psychologist: Energy manager: Environmental education officer Method: Heritage manager: Grab bars(3:1) Vision Baseline Vision/History: Wears glasses Wears Glasses: At all times Vision Assessment?: Yes Eye Alignment: Within Functional Limits Ocular Range of Motion: Within Functional Limits Alignment/Gaze Preference: Within Defined Limits Perception  Perception: Impaired Inattention/Neglect: Does not attend  to left visual field Praxis Praxis: Intact Cognition Overall Cognitive Status: Impaired/Different from baseline Arousal/Alertness: Awake/alert Orientation Level: Person;Place;Situation Person: Oriented Situation: Oriented Year: 2020 Month: June Day of Week: Correct Memory: Impaired Memory Impairment: Decreased short term memory;Decreased recall of new information Decreased Short Term Memory: Verbal basic;Functional basic Immediate Memory Recall: Sock;Blue;Bed Memory Recall Sock: Not able to recall Memory Recall Blue: With Cue Memory Recall Bed: (not able to recall) Attention: Sustained Sustained Attention: Impaired Sustained Attention Impairment: Functional basic;Verbal basic Awareness: Impaired Awareness Impairment: Intellectual impairment Problem Solving: Impaired Problem Solving Impairment: Verbal basic;Functional basic Executive Function: Reasoning Reasoning: Impaired Reasoning Impairment: Verbal basic;Functional basic Safety/Judgment: Impaired Sensation Sensation Light Touch: Appears Intact Proprioception: Appears Intact Stereognosis: Appears Intact Coordination Gross Motor Movements are Fluid and Coordinated: No Fine Motor Movements are Fluid and Coordinated: No Coordination and Movement Description: decr coordination Motor  Motor Motor: Hemiplegia;Abnormal postural alignment and control Mobility  Bed Mobility Bed Mobility: Supine to Sit Supine to Sit: Minimal Assistance - Patient > 75% Transfers Sit to Stand: Minimal Assistance - Patient > 75% Stand to Sit: Minimal Assistance - Patient > 75%  Trunk/Postural Assessment  Cervical Assessment Cervical Assessment: Within Functional Limits Thoracic Assessment Thoracic Assessment: Within Functional Limits Lumbar Assessment Lumbar Assessment: Within Functional Limits Postural Control Postural Control: Deficits on evaluation Righting Reactions: delayed Protective Responses: delayed  Balance Balance Balance  Assessed: Yes Dynamic Sitting Balance Sitting balance - Comments: pt noted with L lateral lean sitting in chair Static Standing Balance Static Standing - Level of Assistance: 4: Min assist Dynamic Standing Balance Dynamic Standing - Level of Assistance: 3: Mod assist Extremity/Trunk Assessment RUE Assessment RUE Assessment: Within Functional Limits LUE Assessment LUE Assessment: Exceptions to [facility] LUE Body System: Neuro Brunstrum levels for arm and hand: Arm;Hand Brunstrum level for arm: Stage V Relative Independence from Synergy Brunstrum level for hand: Stage VI Isolated joint movements LUE AROM (degrees) LUE Overall AROM Comments: 0-110 LUE Strength LUE Overall Strength Comments: 4/5 LUE Tone LUE Tone: Within Functional Limits     Refer to Care Plan for Long Term Goals  Recommendations for other services: Neuropsych   Discharge Criteria: Patient will be discharged from OT if patient refuses treatment 3 consecutive times without medical reason, if treatment goals not met, if there is a change in medical status, if patient makes no progress towards goals or if  patient is discharged from hospital.  The above assessment, treatment plan, treatment alternatives and goals were discussed and mutually agreed upon: by patient  Nicoletta Ba 05/28/2019, 12:54 PM

## 2019-05-28 NOTE — Evaluation (Signed)
Physical Therapy Assessment and Plan  Patient Details  Name: Jose Castro MRN: [id number] Date of Birth: [birth year]  PT Diagnosis: Abnormal posture, Abnormality of gait, Cognitive deficits, Coordination disorder, Difficulty walking, Hemiplegia non-dominant, Impaired cognition and Muscle weakness Rehab Potential: Good ELOS: 10-14 days   Today's Date: 05/28/2019 PT Individual Time: 1300-1400 PT Individual Time Calculation (min): 60 min    Problem List:  Patient Active Problem List   Diagnosis Date Noted  . Right middle cerebral artery stroke ([place]) 05/27/2019  . Acute ischemic right MCA stroke ([place]) 05/25/2019  . Goals of care, counseling/discussion   . Palliative care by specialist   . DNR (do not resuscitate) discussion   . Stroke, acute, embolic ([place]) 38/75/6433  . Persistent proteinuria 07/30/2018  . 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 Chronic kidney disease, stage III (moderate) ([place]) 06/15/2018  . Leg pain 05/06/2018  . Lymphedema 05/06/2018  . Coagulopathy ([place]) 07/07/2017  . Lumbar spondylosis 03/10/2017  . Elevated uric acid in blood 11/20/2016  . BPH (benign prostatic hyperplasia) 05/29/2016  . Peripheral vascular disease of lower extremity ([place]) 03/03/2016  . Gallstone 12/31/2015  . 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 Atherosclerosis of aorta ([place]) 11/22/2015  . Carotid artery narrowing 11/22/2015  . Diverticulosis of colon 11/22/2015  . Decreased creatinine clearance 11/22/2015  . Arthritis, degenerative 11/22/2015  . Lactose intolerance 11/22/2015  . Basal cell carcinoma 11/22/2015  . Essential hypertension 06/21/2015  . Hyperlipemia 06/21/2015  . GERD (gastroesophageal reflux disease) 06/21/2015  . Calculus of kidney 11/18/2013    Past Medical History:  Past Medical History:  Diagnosis Date  . Arthritis   . BPH (benign prostatic hyperplasia)   . Chronic kidney disease    had a kidney stone which per  family was a cyst that was removed  . Dysrhythmia   . GERD (gastroesophageal reflux disease)   . History of kidney stones   . Hyperlipidemia   . Hypertension   . Splenic vein thrombosis   . Stroke ([place]) 05/22/2019  . Thrombosis 12/2015   mural  area and no notation of heart attack   Past Surgical History:  Past Surgical History:  Procedure Laterality Date  . APPENDECTOMY    . CATARACT EXTRACTION, BILATERAL    . CYSTOSCOPY WITH INSERTION OF UROLIFT    . KIDNEY STONE SURGERY    . KNEE SURGERY    . PACEMAKER INSERTION N/A 07/13/2018   Procedure: INSERTION PACEMAKER-DUEL CHAMBER INITIAL IMPLANT;  Surgeon: Isaias Cowman, MD;  Location: [facility] ORS;  Service: Cardiovascular;  Laterality: N/A;  . SPINE SURGERY      Assessment & Plan Clinical Impression: Patient is a 85 year old right-handed male with history of hypertension, hyperlipidemia, CKD stage II with creatinine 1.28-1.35, dysrhythmia with heart block status post pacemaker placement August 2019 followed by [facility]. Presented 05/22/2019 after being found down on the floor at home with reported left fixed eye deviation to the right. WBC 25,400, urinalysis negative, COVID negative, CK 970, blood cultures no growth to date.Chest x-ray no focal infiltrate. Mild left base subsegmental atelectasis. Cranial CT scan showed multiple areas of acute nonhemorrhagic infarct right hemisphere.MRI not completed due to pacemaker. Echocardiogram with ejection fraction of 29% normal systolic function. Hospital course atrial fibrillation with cardiology services consulted borderline troponin 0.44flt to be related to demand ischemia. Patient initially on aspirin for CVA prophylaxis and changed to Eliquis 05/26/2019.Dysphasia #3 nectar thick liquids. WBC improved 14,800 and afebrile. Patient transferred to CIR  on 05/27/2019 .   Patient currently requires mod with mobility secondary to muscle weakness, decreased cardiorespiratoy endurance,  impaired timing and sequencing, unbalanced muscle activation, motor apraxia, decreased coordination and decreased motor planning, decreased visual perceptual skills, decreased attention to left, decreased initiation, decreased attention, decreased awareness, decreased problem solving, decreased safety awareness, decreased memory and delayed processing and decreased standing balance, decreased postural control, hemiplegia and decreased balance strategies.  Prior to hospitalization, patient was independent  with mobility and lived with Alone in a House home.  Home access is 2-3 steps to reach porchStairs to enter.  Patient will benefit from skilled PT intervention to maximize safe functional mobility, minimize fall risk and decrease caregiver burden for planned discharge home with 24 hour supervision.  Anticipate patient will benefit from follow up [place] at discharge.  PT - End of Session Activity Tolerance: Tolerates < 10 min activity, no significant change in vital signs Endurance Deficit: Yes Endurance Deficit Description: decreased PT Assessment Rehab Potential (ACUTE/IP ONLY): Good PT Barriers to Discharge: Inaccessible home environment;Home environment access/layout PT Barriers to Discharge Comments: bed/shower on 2nd floor of house, family planning to install 2nd rail for steps in house and rail for steps outside of house PT Patient demonstrates impairments in the following area(s): Balance;Behavior;Endurance;Motor;Safety;Perception;Sensory PT Transfers Functional Problem(s): Bed Mobility;Bed to Chair;Car;Furniture;Floor PT Locomotion Functional Problem(s): Stairs;Ambulation PT Plan PT Intensity: Minimum of 1-2 x/day ,45 to 90 minutes PT Frequency: 5 out of 7 days PT Duration Estimated Length of Stay: 10-14 days PT Treatment/Interventions: Ambulation/gait training;Community reintegration;DME/adaptive equipment instruction;Neuromuscular re-education;Stair training;Psychosocial support;UE/LE  Strength taining/ROM;UE/LE Coordination activities;Therapeutic Activities;Skin care/wound management;Pain management;Discharge planning;Balance/vestibular training;Cognitive remediation/compensation;Disease management/prevention;Functional mobility training;Patient/family education;Splinting/orthotics;Therapeutic Exercise;Visual/perceptual remediation/compensation PT Transfers Anticipated Outcome(s): supervision PT Locomotion Anticipated Outcome(s): supervision household gait w/ LRAD PT Recommendation Follow Up Recommendations: Home health PT Patient destination: Home Equipment Recommended: To be determined  Skilled Therapeutic Intervention  Pt in recliner and agreeable to therapy, no c/o pain. Sit<>stands to RW w/ min assist and ambulated 25' w/ min assist overall for RW management and tactile/verbal cues for gait pattern and upright posture. Gait 5' w/o AD, mod assist via R HHA. Pt needing to toilet prior to leaving room, toilet transfer w/ mod assist and min assist for pericare and LE garment management w/ multiple cues for sequencing and technique. Pt often needing cues for initiation, termination of task, and sequencing. Pt is also HOH, and this may be contributing to perceived cognitive deficits. Total assist w/c transport to/from therapy gym for time management. Negotiated 4 steps w/ B rails, min assist as well. Instructed pt in results of PT evaluation as detailed below, PT POC, rehab potential, rehab goals, and discharge recommendations. Discussed this with his granddaughter as well, who was in agreement. Pt's granddaughter also assisted w/ providing PLOF and home set-up information. Additionally discussed CIR's policies regarding fall safety and use of chair alarm and/or quick release belt (for when family is not present to provide direct supervision). Pt verbalized understanding and in agreement. Ended session in recliner, all needs in reach.  PT  Evaluation Precautions/Restrictions Precautions Precautions: None Precaution Comments: swallowing Restrictions Weight Bearing Restrictions: No General   Vital SignsTherapy Vitals Temp: 98.1 F (36.7 C) Temp Source: Oral Pulse Rate: (!) 59 BP: 105/69 Oxygen Therapy SpO2: 95 % O2 Device: Room Air Pain Pain Assessment Pain Scale: 0-10 Pain Score: 0-No pain Pain Type: Acute pain Pain Location: Toe (Comment which one)((R) Great Toe) Pain Orientation: Right Pain Descriptors / Indicators: Aching;Discomfort Pain Onset: On-going Pain Intervention(s): Emotional  support Home Living/Prior Functioning Home Living Available Help at Discharge: Family;Available 24 hours/day(family planning to coordinate 24/7 assist if needed) Type of Home: House Home Access: Stairs to enter [employer] of Steps: 2-3 steps to reach porch Entrance Stairs-Rails: None(family planning to install R rail prior to d/c) Home Layout: 1/2 bath on main level;Bed/bath upstairs;Two level Alternate Level Stairs-Rails: Right(family planning to install L rail prior to d/c) Bathroom Shower/Tub: Walk-in shower;Door [employer] Toilet: Standard  Lives With: Alone Prior Function Level of Independence: Independent with basic ADLs;Independent with homemaking with wheelchair;Independent with homemaking with ambulation;Independent with gait  Able to Take Stairs?: Yes Driving: Yes Vocation: Retired Leisure: Hobbies-yes (Comment) Comments: Pt active in community, plays golf, 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 Vision/Perception  Vision - Assessment Eye Alignment: Within Functional Limits Ocular Range of Motion: Within Functional Limits Alignment/Gaze Preference: Within Defined Limits Perception Perception: Impaired Inattention/Neglect: Does not attend to left visual field Praxis Praxis: Intact  Cognition Overall Cognitive Status: Impaired/Different from baseline Arousal/Alertness: Awake/alert Attention:  Sustained Sustained Attention: Impaired Sustained Attention Impairment: Functional basic;Verbal basic Memory: Impaired Memory Impairment: Decreased short term memory;Decreased recall of new information Decreased Short Term Memory: Verbal basic;Functional basic Awareness: Impaired Awareness Impairment: Intellectual impairment Problem Solving: Impaired Problem Solving Impairment: Verbal basic;Functional basic Executive Function: Reasoning Reasoning: Impaired Reasoning Impairment: Verbal basic;Functional basic Safety/Judgment: Impaired Sensation Sensation Light Touch: Appears Intact Proprioception: Appears Intact Stereognosis: Appears Intact Coordination Gross Motor Movements are Fluid and Coordinated: No Fine Motor Movements are Fluid and Coordinated: No Coordination and Movement Description: decr coordination Heel Shin Test: Impaired L side, decreased speed and accuracy of movement Motor  Motor Motor: Hemiplegia;Ataxia;Abnormal postural alignment and control Motor - Skilled Clinical Observations: Mild L hemi, L motor apraxia  Mobility Bed Mobility Bed Mobility: Rolling Left;Rolling Right;Supine to Sit;Sit to Supine Rolling Right: Minimal Assistance - Patient > 75% Rolling Left: Minimal Assistance - Patient > 75% Supine to Sit: Minimal Assistance - Patient > 75% Sit to Supine: Minimal Assistance - Patient > 75% Transfers Transfers: Sit to Stand;Stand to Sit;Stand Pivot Transfers Sit to Stand: Minimal Assistance - Patient > 75% Stand to Sit: Minimal Assistance - Patient > 75% Stand Pivot Transfers: Minimal Assistance - Patient > 75% Transfer (Assistive device): 1 person hand held assist Locomotion  Gait Ambulation: Yes Gait Assistance: Minimal Assistance - Patient > 75% Gait Distance (Feet): 25 Feet Assistive device: Rolling walker Gait Gait: Yes Gait Pattern: Impaired Gait Pattern: Shuffle;Ataxic;Poor foot clearance - left;Trunk flexed Stairs / Additional  Locomotion Stairs: Yes Stairs Assistance: Minimal Assistance - Patient > 75% Stair Management Technique: Two rails Number of Stairs: 4 Height of Stairs: 6 Wheelchair Mobility Wheelchair Mobility: No  Trunk/Postural Assessment  Cervical Assessment Cervical Assessment: Within Functional Limits Thoracic Assessment Thoracic Assessment: Within Functional Limits Lumbar Assessment Lumbar Assessment: Within Functional Limits Postural Control Postural Control: Deficits on evaluation Righting Reactions: delayed Protective Responses: delayed  Balance Balance Balance Assessed: Yes Dynamic Sitting Balance Sitting balance - Comments: pt noted with L lateral lean sitting in chair Static Standing Balance Static Standing - Level of Assistance: 4: Min assist Dynamic Standing Balance Dynamic Standing - Level of Assistance: 3: Mod assist Extremity Assessment  RUE Assessment RUE Assessment: Within Functional Limits LUE Assessment LUE Assessment: Exceptions to [facility] LUE Body System: Neuro Brunstrum levels for arm and hand: Arm;Hand Brunstrum level for arm: Stage V Relative Independence from [employer]nergy Brunstrum level for hand: Stage VI Isolated joint movements LUE AROM (degrees) LUE Overall AROM Comments: 0-110 LUE Strength LUE Overall  Strength Comments: 4/5 LUE Tone LUE Tone: Within Functional Limits RLE Assessment RLE Assessment: Exceptions to [facility] Passive Range of Motion (PROM) Comments: WFL General Strength Comments: Globally 4/5 LLE Assessment LLE Assessment: Exceptions to [facility] Passive Range of Motion (PROM) Comments: WFL General Strength Comments: Globally 3 to 4-/5    Refer to Care Plan for Long Term Goals  Recommendations for other services: None   Discharge Criteria: Patient will be discharged from PT if patient refuses treatment 3 consecutive times without medical reason, if treatment goals not met, if there is a change in medical status, if patient makes no progress towards  goals or if patient is discharged from hospital.  The above assessment, treatment plan, treatment alternatives and goals were discussed and mutually agreed upon: by patient and by family  Kamdon Reisig K Shriyan Arakawa 05/28/2019, 1:42 PM

## 2019-05-28 NOTE — Evaluation (Signed)
Speech Language Pathology Assessment and Plan  Patient Details  Name: Jose Castro MRN: [id number] Date of Birth: 1926-08-22  SLP Diagnosis: Dysarthria;Cognitive Impairments;Dysphagia  Rehab Potential: Good ELOS: 8-12 days   Today's Date: 05/28/2019 SLP Individual Time: 0700-0800 SLP Individual Time Calculation (min): 60 min   Problem List:  Patient Active Problem List   Diagnosis Date Noted  . Right middle cerebral artery stroke ([place]) 05/27/2019  . Acute ischemic right MCA stroke ([place]) 05/25/2019  . Goals of care, counseling/discussion   . Palliative care by specialist   . DNR (do not resuscitate) discussion   . Stroke, acute, embolic ([place]) 18/29/9371  . Persistent proteinuria 07/30/2018  . 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 Chronic kidney disease, stage III (moderate) ([place]) 06/15/2018  . Leg pain 05/06/2018  . Lymphedema 05/06/2018  . Coagulopathy ([place]) 07/07/2017  . Lumbar spondylosis 03/10/2017  . Elevated uric acid in blood 11/20/2016  . BPH (benign prostatic hyperplasia) 05/29/2016  . Peripheral vascular disease of lower extremity ([place]) 03/03/2016  . Gallstone 12/31/2015  . 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 Atherosclerosis of aorta ([place]) 11/22/2015  . Carotid artery narrowing 11/22/2015  . Diverticulosis of colon 11/22/2015  . Decreased creatinine clearance 11/22/2015  . Arthritis, degenerative 11/22/2015  . Lactose intolerance 11/22/2015  . Basal cell carcinoma 11/22/2015  . Essential hypertension 06/21/2015  . Hyperlipemia 06/21/2015  . GERD (gastroesophageal reflux disease) 06/21/2015  . Calculus of kidney 11/18/2013   Past Medical History:  Past Medical History:  Diagnosis Date  . Arthritis   . BPH (benign prostatic hyperplasia)   . Chronic kidney disease    had a kidney stone which per family was a cyst that was removed  . Dysrhythmia   . GERD (gastroesophageal reflux disease)   . History of  kidney stones   . Hyperlipidemia   . Hypertension   . Splenic vein thrombosis   . Stroke ([place]) 05/22/2019  . Thrombosis 12/2015   mural  area and no notation of heart attack   Past Surgical History:  Past Surgical History:  Procedure Laterality Date  . APPENDECTOMY    . CATARACT EXTRACTION, BILATERAL    . CYSTOSCOPY WITH INSERTION OF UROLIFT    . KIDNEY STONE SURGERY    . KNEE SURGERY    . PACEMAKER INSERTION N/A 07/13/2018   Procedure: INSERTION PACEMAKER-DUEL CHAMBER INITIAL IMPLANT;  Surgeon: Isaias Cowman, MD;  Location: [facility] ORS;  Service: Cardiovascular;  Laterality: N/A;  . SPINE SURGERY      Assessment / Plan / Recommendation Clinical Impression HPI: Jose Castro is a 85 year old right-handed male with history of hypertension, hyperlipidemia, CKD stage II with creatinine 1.28-1.35, dysrhythmia with heart block status post pacemaker placement August 2019 followed by Dr Clayborn Bigness.  Per chart review patient lives alone.  Independent in all aspects of driving and ADLs.  2 level home with half bath on main level bed and bathroom upstairs.  Family plans to provide 24/7 assistance as needed at discharge.  Presented 05/22/2019 after being found down on the floor at home with reported left fixed eye deviation to the right.   WBC 25,400, urinalysis negative, COVID negative, CK 970, blood cultures no growth to date.  Chest x-ray no focal infiltrate.  Mild left base subsegmental atelectasis.  Cranial CT scan showed multiple areas of acute nonhemorrhagic infarct right hemisphere.  MRI not completed due to pacemaker.  Echocardiogram with ejection fraction of 69% normal systolic function.  Hospital course atrial fibrillation with cardiology services consulted borderline troponin  0.05 felt to be related to demand ischemia.  Patient initially on aspirin for CVA prophylaxis and changed to Eliquis 05/26/2019.    Dysphasia #2 honey thick liquids. WBC improved 14,800 and afebrile. Therapy evaluations  completed and patient was admitted for a comprehensive rehab program.  Clinical swallow evaluation was completed. Patient presents with a mild oral dysphagia with s/sx concerning for pharyngeal dysphagia. Patient demonstrated mildly prolonged mastication of regular solid consistency with min lingual residues which cleared following a liquid wash. With thin liquids and nectar thick liquids s/sx of aspiration were observed including: immediate cough and multiple swallows per a bolus. No overt s/sx of aspiration were observed with honey thick liquids, puree, or solid consistency. Of note, a baseline cough was noted prior to and at the completion of PO trials. Due to s/sx of aspiration concerning for pharyngeal dysphagia recommend MBS to further determine current pharyngeal swallow function. Both patient and granddaughter verbalized understanding and agreement with plan to complete MBS. In the interim until MBS is completed recommend continue dysphagia 2 diet and honey thick liquids with medications whole in puree.  Cognitive-linguistic evaluation was completed. Patient presents with a mild dysarthria and mild cognitive deficits. Granddaughter reports prior to this admission patient was independent managing his own finances and medications. A mild dysarthria was observed characterized by reduced speech intelligibility and articulatory precision in conversation. Patient was approximately 70% intelligible in conversation benefiting from verbal cues to decrease rate of speech and increase loudness level. Patient participated in the [facility]) with a score of 20/30 with a WFL score being 26 or great. Patient demonstrated difficulty with memory, visuospatial/executive function, and the clock drawing on the [facility]. Difficulty with functional verbal problem solving for the home environment was observed in conversation.  Patient would benefit from skilled SLP services during CIR stay to increase  functional independence in the home environment upon discharge.   Skilled Therapeutic Interventions          Education with granddaughter Jaclyn Shaggy was completed regarding POC.  SLP Assessment  Patient will need skilled Speech Lanaguage Pathology Services during CIR admission    Recommendations  SLP Diet Recommendations: Honey;Dysphagia 2 (Fine chop) Liquid Administration via: Cup;No straw Medication Administration: Whole meds with puree Supervision: Patient able to self feed;Full supervision/cueing for compensatory strategies Compensations: Minimize environmental distractions;Slow rate;Small sips/bites Postural Changes and/or Swallow Maneuvers: Seated upright 90 degrees Oral Care Recommendations: Oral care before and after PO Recommendations for Other Services: Neuropsych consult Patient destination: Home Follow up Recommendations: Home Health SLP Equipment Recommended: To be determined    SLP Frequency 3 to 5 out of 7 days   SLP Duration  SLP Intensity  SLP Treatment/Interventions 8-12 days  Minumum of 1-2 x/day, 30 to 90 minutes  Cognitive remediation/compensation;Dysphagia/aspiration precaution training;Patient/family education;Therapeutic Exercise;Speech/Language facilitation    Pain Pain Assessment Pain Scale: 0-10 Pain Score: 9  Pain Type: Acute pain Pain Location: Toe (Comment which one)((R) Great Toe) Pain Orientation: Right Pain Descriptors / Indicators: Aching;Discomfort Pain Frequency: Constant Pain Onset: On-going Pain Intervention(s): Medication (See eMAR)  Prior Functioning Cognitive/Linguistic Baseline: Within functional limits Type of Home: House  Lives With: Alone Available Help at Discharge: Family Vocation: Retired  Industrial/product designer Term Goals: Week 1: SLP Short Term Goal 1 (Week 1): Patient will utilize external memory aids to recall new daily information with min A verbal cues. SLP Short Term Goal 2 (Week 1): Patient will consume dysphagia 2 textures  and  honey thick liquids without overt s/sx of aspiration. SLP Short Term Goal 3 (Week 1): Patient will uitlize speech intelligilbity strategies at the conversation level with min verbal cues to achieve 80% intelligibility. SLP Short Term Goal 4 (Week 1): Patient will demonstrate functional problem solvin for basic and familiar (mildly complex) tasks with min A verbal cues.  Refer to Care Plan for Long Term Goals  Recommendations for other services: Neuropsych  Discharge Criteria: Patient will be discharged from SLP if patient refuses treatment 3 consecutive times without medical reason, if treatment goals not met, if there is a change in medical status, if patient makes no progress towards goals or if patient is discharged from hospital.  The above assessment, treatment plan, treatment alternatives and goals were discussed and mutually agreed upon: by patient and by family  Cristy Folks 05/28/2019, 9:36 AM

## 2019-05-29 DIAGNOSIS — I10 Essential (primary) hypertension: Secondary | ICD-10-CM

## 2019-05-29 MED ORDER — PANTOPRAZOLE SODIUM 40 MG PO TBEC
40.0000 mg | DELAYED_RELEASE_TABLET | Freq: Every day | ORAL | Status: DC
  Administered 2019-05-30 – 2019-06-08 (×10): 40 mg via ORAL
  Filled 2019-05-29 (×10): qty 1

## 2019-05-29 MED ORDER — RESOURCE THICKENUP CLEAR PO POWD
ORAL | Status: DC | PRN
  Filled 2019-05-29: qty 125

## 2019-05-29 NOTE — Progress Notes (Signed)
[place] PHYSICAL MEDICINE & REHABILITATION PROGRESS NOTE   Subjective/Complaints: Had a good night. Slept well. Denies pain  ROS: Patient denies fever, rash, sore throat, blurred vision, nausea, vomiting, diarrhea, cough, shortness of breath or chest pain, joint or back pain, headache, or mood change.    Objective:   No results found. No results for input(s): WBC, HGB, HCT, PLT in the last 72 hours. Recent Labs    05/27/19 0607  NA 136  K 3.5  CL 101  CO2 25  GLUCOSE 124*  BUN 41*  CREATININE 1.16  CALCIUM 8.3*    Intake/Output Summary (Last 24 hours) at 05/29/2019 0927 Last data filed at 05/29/2019 0730 Gross per 24 hour  Intake 480 ml  Output 175 ml  Net 305 ml     Physical Exam: Vital Signs Blood pressure (!) 142/65, pulse 60, temperature 97.9 F (36.6 C), temperature source Oral, resp. rate 17, height 5\' 10"  (1.778 m), weight 75.8 kg, SpO2 92 %. Constitutional: No distress . Vital signs reviewed. HEENT: EOMI, oral membranes moist Neck: supple Cardiovascular: RRR without murmur. No JVD    Respiratory: CTA Bilaterally without wheezes or rales. Normal effort    GI: BS +, non-tender, non-distended  Extremities: No clubbing, cyanosis, or edema. Pulses are 2+ Skin: Clean and intact without signs of breakdown Neuro: pt alert and oriented to person, place. Ongoing word finding deficits with apraxia. Processing delays.    Sensses pain in all 4's. .LUE and LLE 4 to 4+/5. RUE and RLE 5/5. Musculoskeletal: Full ROM, No pain with AROM or PROM in the neck, trunk, or extremities. Posture appropriate Psych: Pt's affect is appropriate. Pt is cooperative    Assessment/Plan: 1. Functional deficits secondary to right MCA infarct which require 3+ hours per day of interdisciplinary therapy in a comprehensive inpatient rehab setting.  Physiatrist is providing close team supervision and 24 hour management of active medical problems listed below.  Physiatrist and rehab team  continue to assess barriers to discharge/monitor patient progress toward functional and medical goals  Care Tool:  Bathing    Body parts bathed by patient: Right arm, Left arm, Chest, Abdomen, Front perineal area, Buttocks, Right upper leg, Left upper leg, Face   Body parts bathed by helper: Right lower leg, Left lower leg     Bathing assist Assist Level: Minimal Assistance - Patient > 75%     Upper Body Dressing/Undressing Upper body dressing   What is the patient wearing?: Pull over shirt    Upper body assist Assist Level: Minimal Assistance - Patient > 75%    Lower Body Dressing/Undressing Lower body dressing      What is the patient wearing?: Pants, Incontinence brief     Lower body assist Assist for lower body dressing: Moderate Assistance - Patient 50 - 74%     Toileting Toileting    Toileting assist Assist for toileting: Moderate Assistance - Patient 1 - 74%(assist w/ male urinal)     Transfers Chair/bed transfer  Transfers assist     Chair/bed transfer assist level: Minimal Assistance - Patient > 75%     Locomotion Ambulation   Ambulation assist      Assist level: Moderate Assistance - Patient 50 - 74% Assistive device: Hand held assist Max distance: 5'   Walk 10 feet activity   Assist  Walk 10 feet activity did not occur: Safety/medical concerns        Walk 50 feet activity   Assist Walk 50 feet with 2 turns  activity did not occur: Safety/medical concerns         Walk 150 feet activity   Assist Walk 150 feet activity did not occur: Safety/medical concerns         Walk 10 feet on uneven surface  activity   Assist Walk 10 feet on uneven surfaces activity did not occur: Safety/medical concerns         Wheelchair     Assist Will patient use wheelchair at discharge?: No   Wheelchair activity did not occur: N/A         Wheelchair 50 feet with 2 turns activity    Assist    Wheelchair 50 feet with 2  turns activity did not occur: N/A       Wheelchair 150 feet activity     Assist Wheelchair 150 feet activity did not occur: N/A        Medical Problem List and Plan: 1.Dysarthria, right gaze deviation with decreased functional mobilitysecondary to right MCA infarction           -Continue CIR therapies including PT, OT, and SLP  2. Antithrombotics: -DVT/anticoagulation:Eliquis -antiplatelet therapy: N/A 3. Pain Management:Tylenol as needed 4. Mood:Provide emotional support -antipsychotic agents: N/A 5. Neuropsych: This patientiscapable of making decisions on hisown behalf. 6. Skin/Wound Care:Routine skin checks 7. Fluids/Electrolytes/Nutrition:BUN 41 6/26  -follow up labs Monday             -push fluids 8. Dysphagia. Dysphasia #2 Honeyliquids. Follow-up speech therapy 9. Complete heart block. 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  10. New onset atrial fibrillation.  Continue Eliquis as well as initiation of Toprol-XL 25 mg daily             -HR controlled 6/28 11. Hyperlipidemia. Crestor 12. BPH. Proscar 5 mg daily 13. Permissive hypertension. Patient on lisinopril 20 mg daily prior to admission                 -BP controlled 6/28 14. GERD. Protonix 15.Leukocytosis .Improved with BC negative,Urinalysis negative nitrite, chest x-ray with no infiltrate 16.CKD. Baseline creatinine 1.28-1.55. recently 1.16   -recheck tomorrow  LOS: 2 days A FACE TO FACE EVALUATION WAS PERFORMED  Meredith Staggers 05/29/2019, 9:27 AM

## 2019-05-30 ENCOUNTER — Encounter (HOSPITAL_COMMUNITY): Payer: Self-pay

## 2019-05-30 ENCOUNTER — Inpatient Hospital Stay (HOSPITAL_COMMUNITY): Payer: Medicare Other

## 2019-05-30 ENCOUNTER — Inpatient Hospital Stay (HOSPITAL_COMMUNITY): Payer: Medicare Other | Admitting: Occupational Therapy

## 2019-05-30 ENCOUNTER — Inpatient Hospital Stay (HOSPITAL_COMMUNITY): Payer: Medicare Other | Admitting: Physical Therapy

## 2019-05-30 DIAGNOSIS — I4891 Unspecified atrial fibrillation: Secondary | ICD-10-CM

## 2019-05-30 DIAGNOSIS — I69391 Dysphagia following cerebral infarction: Secondary | ICD-10-CM

## 2019-05-30 DIAGNOSIS — I10 Essential (primary) hypertension: Secondary | ICD-10-CM

## 2019-05-30 DIAGNOSIS — D72829 Elevated white blood cell count, unspecified: Secondary | ICD-10-CM

## 2019-05-30 DIAGNOSIS — N182 Chronic kidney disease, stage 2 (mild): Secondary | ICD-10-CM

## 2019-05-30 LAB — CBC WITH DIFFERENTIAL/PLATELET
Abs Immature Granulocytes: 0.13 10*3/uL — ABNORMAL HIGH (ref 0.00–0.07)
Basophils Absolute: 0.1 10*3/uL (ref 0.0–0.1)
Basophils Relative: 1 %
Eosinophils Absolute: 0.4 10*3/uL (ref 0.0–0.5)
Eosinophils Relative: 4 %
HCT: 44.6 % (ref 39.0–52.0)
Hemoglobin: 15 g/dL (ref 13.0–17.0)
Immature Granulocytes: 1 %
Lymphocytes Relative: 19 %
Lymphs Abs: 2.2 10*3/uL (ref 0.7–4.0)
MCH: 29.9 pg (ref 26.0–34.0)
MCHC: 33.6 g/dL (ref 30.0–36.0)
MCV: 88.8 fL (ref 80.0–100.0)
Monocytes Absolute: 1.2 10*3/uL — ABNORMAL HIGH (ref 0.1–1.0)
Monocytes Relative: 10 %
Neutro Abs: 8 10*3/uL — ABNORMAL HIGH (ref 1.7–7.7)
Neutrophils Relative %: 65 %
Platelets: 340 10*3/uL (ref 150–400)
RBC: 5.02 MIL/uL (ref 4.22–5.81)
RDW: 13.7 % (ref 11.5–15.5)
WBC: 12.1 10*3/uL — ABNORMAL HIGH (ref 4.0–10.5)
nRBC: 0 % (ref 0.0–0.2)

## 2019-05-30 LAB — COMPREHENSIVE METABOLIC PANEL
ALT: 15 U/L (ref 0–44)
AST: 22 U/L (ref 15–41)
Albumin: 2.7 g/dL — ABNORMAL LOW (ref 3.5–5.0)
Alkaline Phosphatase: 66 U/L (ref 38–126)
Anion gap: 10 (ref 5–15)
BUN: 27 mg/dL — ABNORMAL HIGH (ref 8–23)
CO2: 25 mmol/L (ref 22–32)
Calcium: 8.7 mg/dL — ABNORMAL LOW (ref 8.9–10.3)
Chloride: 103 mmol/L (ref 98–111)
Creatinine, Ser: 1.04 mg/dL (ref 0.61–1.24)
GFR calc Af Amer: >60 mL/min (ref >60)
GFR calc non Af Amer: >60 mL/min (ref >60)
Glucose, Bld: 96 mg/dL (ref 70–99)
Potassium: 3.7 mmol/L (ref 3.5–5.1)
Sodium: 138 mmol/L (ref 135–145)
Total Bilirubin: 1 mg/dL (ref 0.3–1.2)
Total Protein: 5.9 g/dL — ABNORMAL LOW (ref 6.5–8.1)

## 2019-05-30 LAB — CULTURE, BLOOD (ROUTINE X 2)
Culture: NO GROWTH
Culture: NO GROWTH
Special Requests: ADEQUATE

## 2019-05-30 NOTE — Progress Notes (Signed)
Occupational Therapy Session Note  Patient Details  Name: Jose Castro MRN: [id number] Date of Birth: [birth year]  Today's Date: 05/30/2019 OT Individual Time: 3428-7681 OT Individual Time Calculation (min): 75 min    Short Term Goals: Week 1:  OT Short Term Goal 1 (Week 1): Pt will perform 3/3 grooming tasks with mininmal cuing with supervision OT Short Term Goal 2 (Week 1): Pt will perform UB dressing with supervision OT Short Term Goal 3 (Week 1): Pt will perform LB dressing wiht min A OT Short Term Goal 4 (Week 1): Pt will scan to the left field with minimal cuing to obtain self care items  Skilled Therapeutic Interventions/Progress Updates:    Treatment session with focus on Lt attention, awareness, and functional use of LUE during self-feeding and table top tasks.  Pt received supine in bed asleep.  Pt required increased time to fully arouse. Pt completed bed mobility with min assist and increased time.  Mod assist sit > stand and stand pivot to w/c.  Engaged in self-feeding of lunch with focus on attention to Lt and awareness of safe swallowing strategies.  Pt required min cues to locate items in Lt visual field and increased time when opening containers due to Lt UE weakness.  Noted coughing ~25-30% of meal, mostly after drinking.  Engaged in pattern replication with peg board.  Therapist providing mod cues to attend to Lt side of pattern as pt able to recognize that he was having difficulty but unable to identify why or how to correct issues.  Noted decreased coordination with LUE, but able to utilize during task with increased time. Pt expressed desire to remain upright in chair.  Therefore donned seat belt alarm and left pt upright with call bell in reach.  Therapy Documentation Precautions:  Precautions Precautions: None Precaution Comments: swallowing Restrictions Weight Bearing Restrictions: No General:   Vital Signs: Therapy Vitals Temp: 98.5 F (36.9 C) Temp Source:  Oral Pulse Rate: 86 Resp: 16 BP: 103/66 Patient Position (if appropriate): Sitting Pain: Pain Assessment Pain Score: 0-No pain   Therapy/Group: Individual Therapy  [name] 05/30/2019, 2:56 PM

## 2019-05-30 NOTE — IPOC Note (Signed)
Individualized overall Plan of Care [facility]) Patient Details Name: Jose Castro MRN: [id number] DOB: 08/13/1926  Admitting Diagnosis: Right MCA infarct  Hospital Problems: Active Problems:   Right middle cerebral artery stroke (HCC)   CKD (chronic kidney disease), stage II   Leukocytosis   Benign essential HTN   New onset atrial fibrillation (HCC)   Dysphagia, post-stroke     Functional Problem List: Nursing Endurance, Motor, Nutrition, Safety, Perception, Sensory, Skin Integrity  PT Balance, Behavior, Endurance, Motor, Safety, Perception, Sensory  OT Balance, Cognition, Endurance, Safety, Pain, Motor  SLP Cognition, Motor, Nutrition  TR         Basic ADL's: OT Grooming, Bathing, Dressing, Toileting     Advanced  ADL's: OT       Transfers: PT Bed Mobility, Bed to Chair, Car, Furniture, Futures trader, Metallurgist: PT Stairs, Ambulation     Additional Impairments: OT Fuctional Use of Upper Extremity  SLP        TR      Anticipated Outcomes Item Anticipated Outcome  Self Feeding supervision  Swallowing  Mod I   Basic self-care  supervision  Toileting  supervision   Bathroom Transfers supervision  Bowel/Bladder  Min assist  Transfers  supervision  Locomotion  supervision household gait w/ LRAD  Communication  Supervision  Cognition  Supervision  Pain  < 3  Safety/Judgment  Supervision   Therapy Plan: PT Intensity: Minimum of 1-2 x/day ,45 to 90 minutes PT Frequency: 5 out of 7 days PT Duration Estimated Length of Stay: 10-14 days OT Intensity: Minimum of 1-2 x/day, 45 to 90 minutes OT Frequency: 5 out of 7 days OT Duration/Estimated Length of Stay: ~10-14 days SLP Intensity: Minumum of 1-2 x/day, 30 to 90 minutes SLP Frequency: 3 to 5 out of 7 days SLP Duration/Estimated Length of Stay: 8-12 days    Team Interventions: Nursing Interventions Patient/Family Education, Disease Management/Prevention, Skin Care/Wound  Management, Cognitive Remediation/Compensation, Medication Management, Dysphagia/Aspiration Precaution Training, Discharge Planning  PT interventions Ambulation/gait training, Community reintegration, DME/adaptive equipment instruction, Neuromuscular re-education, Stair training, Psychosocial support, UE/LE Strength taining/ROM, UE/LE Coordination activities, Therapeutic Activities, Skin care/wound management, Pain management, Discharge planning, Balance/vestibular training, Cognitive remediation/compensation, Disease management/prevention, Functional mobility training, Patient/family education, Splinting/orthotics, Therapeutic Exercise, Visual/perceptual remediation/compensation  OT Interventions Balance/vestibular training, Disease mangement/prevention, Neuromuscular re-education, Self Care/advanced ADL retraining, Therapeutic Exercise, Wheelchair propulsion/positioning, Cognitive remediation/compensation, DME/adaptive equipment instruction, Pain management, Skin care/wound managment, UE/LE Strength taining/ROM, Community reintegration, Barrister's clerk education, Splinting/orthotics, UE/LE Coordination activities, Discharge planning, Functional mobility training, Psychosocial support, Therapeutic Activities  SLP Interventions Cognitive remediation/compensation, Dysphagia/aspiration precaution training, Patient/family education, Therapeutic Exercise, Speech/Language facilitation  TR Interventions    SW/CM Interventions Discharge Planning, Psychosocial Support, Patient/Family Education   Barriers to Discharge MD  Medical stability  Nursing Nutrition means dysphagia  PT Inaccessible home environment, Home environment access/layout bed/shower on 2nd floor of house, family planning to install 2nd rail for steps in house and rail for steps outside of house  OT      SLP      SW       Team Discharge Planning: Destination: PT-Home ,OT- Home , SLP-Home Projected Follow-up: PT-Home health PT, OT-  24  hour supervision/assistance, Outpatient OT, SLP-Home Health SLP Projected Equipment Needs: PT-To be determined, OT- Tub/shower seat, SLP-To be determined Equipment Details: PT- , OT-  Patient/family involved in discharge planning: PT- Patient, Family member/caregiver,  OT-Patient, Family member/caregiver, SLP-Patient, Family member/caregiver  MD ELOS: 10-14 days. Medical Rehab Prognosis:  Good Assessment: 85 year old right-handed male with history of hypertension, hyperlipidemia, CKD stage II with creatinine 1.28-1.35, dysrhythmia with heart block status post pacemaker placement August 2019 followed [facility]. Presented 05/22/2019 after being found down on the floor at home with reported left fixed eye deviation to the right. WBC 25,400, urinalysis negative, COVID negative, CK 970, blood cultures no growth to date.Chest x-ray no focal infiltrate. Mild left base subsegmental atelectasis. Cranial CT scan showed multiple areas of acute nonhemorrhagic infarct right hemisphere.MRI not completed due to pacemaker. Echocardiogram with ejection fraction of 51% normal systolic function. Hospital course atrial fibrillation with cardiology services consulted borderline troponin 0.38felt to be related to demand ischemia. Patient initially on aspirin for CVA prophylaxis and changed to Eliquis 05/26/2019.Dysphasia #2 honeythick liquids. WBC improved 14,800 and afebrile.Patient with resulting functional deficits with mobility, transfers, swallowing, self-care.  Will set goals for Supervision with PT/OT/SLP.  Due to the current state of emergency, patients may not be receiving their 3-hours of Medicare-mandated therapy.  See Team Conference Notes for weekly updates to the plan of care

## 2019-05-30 NOTE — Progress Notes (Signed)
Patient information reviewed and entered into eRehab System by Becky Karrin Eisenmenger, PPS coordinator. Information including medical coding, function ability, and quality indicators will be reviewed and updated through discharge.   

## 2019-05-30 NOTE — Progress Notes (Signed)
[place] PHYSICAL MEDICINE & REHABILITATION PROGRESS NOTE   Subjective/Complaints: Patient seen sitting up in his chair.  He states he slept well overnight and had a good weekend.   ROS: Denies CP, SOB, N/V/D  Objective:   No results found. Recent Labs    05/30/19 0727  WBC 12.1*  HGB 15.0  HCT 44.6  PLT 340   Recent Labs    05/30/19 0727  NA 138  K 3.7  CL 103  CO2 25  GLUCOSE 96  BUN 27*  CREATININE 1.04  CALCIUM 8.7*    Intake/Output Summary (Last 24 hours) at 05/30/2019 1523 Last data filed at 05/30/2019 0800 Gross per 24 hour  Intake 240 ml  Output 0 ml  Net 240 ml     Physical Exam: Vital Signs Blood pressure 103/66, pulse 86, temperature 98.5 F (36.9 C), temperature source Oral, resp. rate 16, height 5\' 10"  (1.778 m), weight 75.8 kg, SpO2 (!) 85 %. Constitutional: No distress . Vital signs reviewed. HENT: Normocephalic.  Atraumatic. Eyes: EOMI. No discharge. Cardiovascular: No JVD. Respiratory: Normal effort. GI: Non-distended. Musc: No edema or tenderness in extremities. Skin: Clean and intact without signs of breakdown Neuro: Alert and oriented Motor: RUE/RLE: 5/5 proximal to distal LUE/LLE: 4+/5 proximal to distal  Psych: Pt's affect is appropriate. Pt is cooperative  Assessment/Plan: 1. Functional deficits secondary to right MCA infarct which require 3+ hours per day of interdisciplinary therapy in a comprehensive inpatient rehab setting.  Physiatrist is providing close team supervision and 24 hour management of active medical problems listed below.  Physiatrist and rehab team continue to assess barriers to discharge/monitor patient progress toward functional and medical goals  Care Tool:  Bathing    Body parts bathed by patient: Right arm, Left arm, Chest, Abdomen, Front perineal area, Buttocks, Right upper leg, Left upper leg, Face   Body parts bathed by helper: Right lower leg, Left lower leg     Bathing assist Assist Level:  Minimal Assistance - Patient > 75%     Upper Body Dressing/Undressing Upper body dressing   What is the patient wearing?: Pull over shirt    Upper body assist Assist Level: Minimal Assistance - Patient > 75%    Lower Body Dressing/Undressing Lower body dressing      What is the patient wearing?: Pants, Incontinence brief     Lower body assist Assist for lower body dressing: Moderate Assistance - Patient 50 - 74%     Toileting Toileting    Toileting assist Assist for toileting: Moderate Assistance - Patient 50 - 74%     Transfers Chair/bed transfer  Transfers assist     Chair/bed transfer assist level: Moderate Assistance - Patient 50 - 74%     Locomotion Ambulation   Ambulation assist      Assist level: Moderate Assistance - Patient 50 - 74% Assistive device: Hand held assist Max distance: 5'   Walk 10 feet activity   Assist  Walk 10 feet activity did not occur: Safety/medical concerns        Walk 50 feet activity   Assist Walk 50 feet with 2 turns activity did not occur: Safety/medical concerns         Walk 150 feet activity   Assist Walk 150 feet activity did not occur: Safety/medical concerns         Walk 10 feet on uneven surface  activity   Assist Walk 10 feet on uneven surfaces activity did not occur: Safety/medical concerns  Wheelchair     Assist Will patient use wheelchair at discharge?: No   Wheelchair activity did not occur: N/A         Wheelchair 50 feet with 2 turns activity    Assist    Wheelchair 50 feet with 2 turns activity did not occur: N/A       Wheelchair 150 feet activity     Assist Wheelchair 150 feet activity did not occur: N/A        Medical Problem List and Plan: 1.Dysarthria, right gaze deviation with decreased functional mobilitysecondary to right MCA infarction  Cont CIR  Notes reviewed - stroke, labs personally reviewed 2.  Antithrombotics: -DVT/anticoagulation:Eliquis -antiplatelet therapy: N/A 3. Pain Management:Tylenol as needed 4. Mood:Provide emotional support -antipsychotic agents: N/A 5. Neuropsych: This patientiscapable of making decisions on hisown behalf. 6. Skin/Wound Care:Routine skin checks 7. Fluids/Electrolytes/Nutrition:  BMP within acceptable range on 6/29  Push fluids 8. Dysphagia. Dysphasia #2 Honey. Advance diet as tolerated 9. Complete heart block. 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  10. New onset atrial fibrillation.  Continue Eliquis as well as initiation of Toprol-XL 25 mg daily  HR controlled 6/29 11. Hyperlipidemia. Crestor 12. BPH. Proscar 5 mg daily 13. Hypertension. Patient on lisinopril 20 mg daily prior to admission      Controlled on 6/29 14. GERD. Protonix 15. Leukocytosis:  WBCs 12.1 on 6/29  Afebrile  Cont to monitor 16.CKD. Baseline creatinine 1.28-1.35.   Cr. 1.04 on 6/29  Cont to monitor    LOS: 3 days A FACE TO FACE EVALUATION WAS PERFORMED   Lorie Phenix 05/30/2019, 3:23 PM

## 2019-05-30 NOTE — Progress Notes (Signed)
[place] Individual Statement of Services  Patient Name:  Jose Castro  Date:  05/31/2019  Welcome to the [facility].  Our goal is to provide you with an individualized program based on your diagnosis and situation, designed to meet your specific needs.  With this comprehensive rehabilitation program, you will be expected to participate in at least 3 hours of rehabilitation therapies Monday-Friday, with modified therapy programming on the weekends.  Your rehabilitation program will include the following services:  Physical Therapy (PT), Occupational Therapy (OT), Speech Therapy (ST), 24 hour per day rehabilitation nursing, Neuropsychology, Case Management (Social Worker), Rehabilitation Medicine, Nutrition Services and Pharmacy Services  Weekly team conferences will be held on Wednesdays to discuss your progress.  Your Social Worker will talk with you frequently to get your input and to update you on team discussions.  Team conferences with you and your family in attendance may also be held.  Expected length of stay:  10 to 14 days  Overall anticipated outcome:  Supervision with minimal assistance for recall of information  Depending on your progress and recovery, your program may change. Your Social Worker will coordinate services and will keep you informed of any changes. Your Social Worker's name and contact numbers are listed  below.  The following services may also be recommended but are not provided by the [place] will be made to provide these services after discharge if needed.  Arrangements include referral to agencies that provide these services.  Your insurance has been verified to be:  [employer] Your primary doctor is:  Dr. Luiz Ochoa  Pertinent information will be shared with  your doctor and your insurance company.  Social Worker:  Alfonse Alpers, LCSW  [phone] or [phone]  Information discussed with and copy given to patient by: Trey Sailors, 05/31/2019, 12:00 AM

## 2019-05-30 NOTE — Progress Notes (Signed)
Physical Therapy Session Note  Patient Details  Name: Jose Castro MRN: [id number] Date of Birth: 07/01/1926  Today's Date: 05/30/2019 PT Individual Time: 1000-1111 PT Individual Time Calculation (min): 71 min   Short Term Goals: Week 1:  PT Short Term Goal 1 (Week 1): Pt will ambulate 25' w/ CGA w/ LRAD PT Short Term Goal 2 (Week 1): Pt will maintain dynamic standing balance w/ min assist PT Short Term Goal 3 (Week 1): Pt will attend to L environment w/ min cues 50% of the time during functional mobility PT Short Term Goal 4 (Week 1): Pt will perform bed<>chair transfers w/ CGA  Skilled Therapeutic Interventions/Progress Updates:    pt rec'd in bed soundly sleeping, requires increased time to arouse.  Pt max A for supine to sit due to sleepiness.  Pt then perform sit <> stand with min/mod A throughout session with and without AD.  Stand pivot transfers mod A without device.  Gait training 100' x 3 with RW and min A.  Furniture transfers to recliner and bed with mod A, cues each rep for UE placement. Standing balance task with reaching for and tossing horseshoes without AD with min/mod A.  Pt performs nustep x 7 minutes for UE/LE strength and endurance.  Attempt w/c mobility with bilat UEs. Pt requires max A for coordination and motor planning.  Pt mod A for transfer back to bed without AD.  Pt able to doff shoes with increased time and supervision.  Pt left in bed with alarm set, needs at hand.  Therapy Documentation Precautions:  Precautions Precautions: None Precaution Comments: swallowing Restrictions Weight Bearing Restrictions: No Pain: Pain Assessment Pain Scale: 0-10 Pain Score: 0-No pain   Therapy/Group: Individual Therapy  Dannie Woolen 05/30/2019, 11:12 AM

## 2019-05-30 NOTE — Progress Notes (Signed)
Speech Language Pathology Daily Session Note  Patient Details  Name: Jose Castro MRN: [id number] Date of Birth: [birth year]  Today's Date: 05/30/2019 SLP Individual Time: 0902-0930 SLP Individual Time Calculation (min): 28 min  Short Term Goals: Week 1: SLP Short Term Goal 1 (Week 1): Patient will utilize external memory aids to recall new daily information with min A verbal cues. SLP Short Term Goal 2 (Week 1): Patient will consume dysphagia 2 textures and honey thick liquids without overt s/sx of aspiration. SLP Short Term Goal 3 (Week 1): Patient will uitlize speech intelligilbity strategies at the conversation level with min verbal cues to achieve 80% intelligibility. SLP Short Term Goal 4 (Week 1): Patient will demonstrate functional problem solvin for basic and familiar (mildly complex) tasks with min A verbal cues.  Skilled Therapeutic Interventions: Skilled ST services focused on cognitive skills. Pt required tactile cues to become alert and fell back asleep immediately at the end of the session. SLP facilitated semi-complex problem solving and recall skills during calendar task, pt required mod A verbal cues and max A verbal cues for error awareness and visual tracking while reading. Pt was left in room with call bell within reach and bed alarm set. ST recommends to continue skilled ST services.      Pain Pain Assessment Pain Score: 0-No pain  Therapy/Group: Individual Therapy  Bethzaida Boord  [facility] 05/30/2019, 12:17 PM

## 2019-05-31 ENCOUNTER — Inpatient Hospital Stay (HOSPITAL_COMMUNITY): Payer: Medicare Other

## 2019-05-31 ENCOUNTER — Inpatient Hospital Stay (HOSPITAL_COMMUNITY): Payer: Medicare Other | Admitting: Physical Therapy

## 2019-05-31 ENCOUNTER — Inpatient Hospital Stay (HOSPITAL_COMMUNITY): Payer: Medicare Other | Admitting: Occupational Therapy

## 2019-05-31 ENCOUNTER — Encounter (HOSPITAL_COMMUNITY): Payer: Medicare Other | Admitting: Speech Pathology

## 2019-05-31 NOTE — Plan of Care (Signed)
  Problem: Consults Goal: RH STROKE PATIENT EDUCATION Description: See Patient Education module for education specifics  Outcome: Progressing   Problem: RH SKIN INTEGRITY Goal: RH STG MAINTAIN SKIN INTEGRITY WITH ASSISTANCE Description: STG Maintain Skin Integrity With min Assistance. Outcome: Progressing   Problem: RH SAFETY Goal: RH STG ADHERE TO SAFETY PRECAUTIONS W/ASSISTANCE/DEVICE Description: STG Adhere to Safety Precautions With min Assistance/Device. Outcome: Progressing   Problem: RH KNOWLEDGE DEFICIT Goal: RH STG INCREASE KNOWLEDGE OF HYPERTENSION Description: Patient/caregiver will verbalize understanding of HTN including diet, medications, exercise, follow up appointments, monitoring with min assist. Outcome: Progressing Goal: RH STG INCREASE KNOWLEDGE OF DYSPHAGIA/FLUID INTAKE Description: Patient/caregiver will verbalize understanding of dysphagia including diet, medications, follow up appointments, monitoring with min assist. Outcome: Progressing Goal: RH STG INCREASE KNOWLEGDE OF HYPERLIPIDEMIA Description: Patient/caregiver will verbalize understanding of HLD including diet, medications, exercise, follow up appointments, monitoring with min assist. Outcome: Progressing Goal: RH STG INCREASE KNOWLEDGE OF STROKE PROPHYLAXIS Description: Patient/caregiver will verbalize understanding of stroke prophylaxis including diet, medications, exercise, follow up appointments, monitoring with min assist. Outcome: Progressing

## 2019-05-31 NOTE — Progress Notes (Signed)
Physical Therapy Session Note  Patient Details  Name: Jose Castro MRN: [id number] Date of Birth: 1926/02/06  Today's Date: 05/31/2019 PT Individual Time: 1015-1110 PT Individual Time Calculation (min): 55 min   Short Term Goals: Week 1:  PT Short Term Goal 1 (Week 1): Pt will ambulate 25' w/ CGA w/ LRAD PT Short Term Goal 2 (Week 1): Pt will maintain dynamic standing balance w/ min assist PT Short Term Goal 3 (Week 1): Pt will attend to L environment w/ min cues 50% of the time during functional mobility PT Short Term Goal 4 (Week 1): Pt will perform bed<>chair transfers w/ CGA  Skilled Therapeutic Interventions/Progress Updates:    pt rec'd in bed, happy that he is now on thin liquids.  Pt performs supine to sit with increased time and supervision.  Sit <> stand with min A with RW.  Gait training with obstacle negotiation and side stepping all with CGA with increased cuing for technique with side stepping.  Pt performs gait in home and controlled environment with supervision with RW.  Sit <> stand blocked practice using bilat UEs to push up from w/c initially with min A, progressing to supervision with repetition. Pt left in bed with alarm set, needs at hand.  Therapy Documentation Precautions:  Precautions Precautions: None Precaution Comments: swallowing Restrictions Weight Bearing Restrictions: No  Pain:  no c/o pain   Therapy/Group: Individual Therapy  Adelita Hone 05/31/2019, 11:13 AM

## 2019-05-31 NOTE — Progress Notes (Signed)
Occupational Therapy Session Note  Patient Details  Name: Jose Castro MRN: [id number] Date of Birth: 1926/11/22  Today's Date: 05/31/2019 OT Individual Time: 1130-1230 OT Individual Time Calculation (min): 60 min    Short Term Goals: Week 1:  OT Short Term Goal 1 (Week 1): Pt will perform 3/3 grooming tasks with mininmal cuing with supervision OT Short Term Goal 2 (Week 1): Pt will perform UB dressing with supervision OT Short Term Goal 3 (Week 1): Pt will perform LB dressing wiht min A OT Short Term Goal 4 (Week 1): Pt will scan to the left field with minimal cuing to obtain self care items  Skilled Therapeutic Interventions/Progress Updates:    Treatment session with focus on dynamic standing balance and Lt attention.  Pt received on toilet with nurse tech providing assistance with hygiene.  Pt had been incontinent of urine and bowel requiring increased time and assistance for hygiene.  Pt completed toilet transfer with min assist with RW.  Cues to attend to doorframe when exiting bathroom, due to Lt inattention.  Engaged in [place] in sitting and standing with focus on Lt attention and functional use of LUE.  Pt demonstrating severe Lt inattention, requiring max cues to scan past midline to locate stimulus in Lt visual field.  Min assist for standing balance while completing visual scanning in standing.  Pt demonstrating increased fatigue vs decreased attention to task as the time expired on each round (2 mins).  In one instance pt did not select stimulus during final 22 seconds despite cues and during final attempt, pt abruptly sat with 34 seconds remaining.  Engaged in continued focus on Lt attention with use of pipe tree puzzle. Pt required mod cues for problem solving and sequencing with cues increasing as he completed Lt side of figure.  Returned to room and left upright in w/c with seat belt alarm on and nurse tech arriving to provide supervision during lunch.  Therapy  Documentation Precautions:  Precautions Precautions: None Precaution Comments: swallowing Restrictions Weight Bearing Restrictions: No General:   Vital Signs: Therapy Vitals Temp: 98.6 F (37 C) Temp Source: Oral Pulse Rate: 60 Resp: 16 BP: 118/62 Patient Position (if appropriate): Sitting Oxygen Therapy SpO2: 97 % Pain:   Pt with no c/o pain  Therapy/Group: Individual Therapy  [name] 05/31/2019, 3:17 PM

## 2019-05-31 NOTE — Progress Notes (Signed)
Modified Barium Swallow Progress Note  Patient Details  Name: Jose Castro MRN: [id number] Date of Birth: January 09, 1926  Today's Date: 05/31/2019  Modified Barium Swallow completed.  Full report located under Chart Review in the Imaging Section.  Brief recommendations include the following:  Clinical Impression  Pt presents with mild oral phase deficits c/b decreased lingual manipulation, decreased bolus cohesion which resulted in prolonged oral phase espceivally with solids. Additionally, residue can be seen under tongue on fluro with ~ 2 swallows per bolus to effectively clear oral cavity. Pt's pharyngeal phase appears appropriate in initiation with slight penetration during swallow. Pt with one instance of sensed aspiration but otherwise he was able to establish ocmplete airway protection. Recommend pt continue dysphagia 2 with upgrade to thin liquids, medicine whole in puree and full supervision.   Swallow Evaluation Recommendations       SLP Diet Recommendations: Dysphagia 2 (Fine chop) solids;Thin liquid   Liquid Administration via: Cup;Straw   Medication Administration: Whole meds with puree   Supervision: Patient able to self feed;Full supervision/cueing for compensatory strategies   Compensations: Minimize environmental distractions;Slow rate;Small sips/bites   Postural Changes: Seated upright at 90 degrees   Oral Care Recommendations: Oral care BID        [name] 05/31/2019,3:31 PM

## 2019-05-31 NOTE — Progress Notes (Signed)
Social Work Assessment and Plan   Patient Details  Name: Jose Castro MRN: [id number] Date of Birth: Sep 04, 1926  Today's Date: 05/30/2019  Problem List:  Patient Active Problem List   Diagnosis Date Noted  . CKD (chronic kidney disease), stage II   . Leukocytosis   . Benign essential HTN   . New onset atrial fibrillation ([place])   . Dysphagia, post-stroke   . 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 Acute ischemic right MCA stroke ([place]) 05/25/2019  . Goals of care, counseling/discussion   . Palliative care by specialist   . DNR (do not resuscitate) discussion   . Stroke, acute, embolic ([place]) 12/03/7251  . Persistent proteinuria 07/30/2018  . 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 Chronic kidney disease, stage III (moderate) ([place]) 06/15/2018  . Leg pain 05/06/2018  . Lymphedema 05/06/2018  . Coagulopathy ([place]) 07/07/2017  . Lumbar spondylosis 03/10/2017  . Elevated uric acid in blood 11/20/2016  . BPH (benign prostatic hyperplasia) 05/29/2016  . Peripheral vascular disease of lower extremity ([place]) 03/03/2016  . Gallstone 12/31/2015  . 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 Atherosclerosis of aorta ([place]) 11/22/2015  . Carotid artery narrowing 11/22/2015  . Diverticulosis of colon 11/22/2015  . Decreased creatinine clearance 11/22/2015  . Arthritis, degenerative 11/22/2015  . Lactose intolerance 11/22/2015  . Basal cell carcinoma 11/22/2015  . Essential hypertension 06/21/2015  . Hyperlipemia 06/21/2015  . GERD (gastroesophageal reflux disease) 06/21/2015  . Calculus of kidney 11/18/2013   Past Medical History:  Past Medical History:  Diagnosis Date  . Arthritis   . BPH (benign prostatic hyperplasia)   . Chronic kidney disease    had a kidney stone which per family was a cyst that was removed  . Dysrhythmia   . GERD (gastroesophageal reflux disease)   . History of kidney stones   . Hyperlipidemia   .  Hypertension   . Splenic vein thrombosis   . Stroke ([place]) 05/22/2019  . Thrombosis 12/2015   mural  area and no notation of heart attack   Past Surgical History:  Past Surgical History:  Procedure Laterality Date  . APPENDECTOMY    . CATARACT EXTRACTION, BILATERAL    . CYSTOSCOPY WITH INSERTION OF UROLIFT    . KIDNEY STONE SURGERY    . KNEE SURGERY    . PACEMAKER INSERTION N/A 07/13/2018   Procedure: INSERTION PACEMAKER-DUEL CHAMBER INITIAL IMPLANT;  Surgeon: Isaias Cowman, MD;  Location: [facility] ORS;  Service: Cardiovascular;  Laterality: N/A;  . SPINE SURGERY     Social History:  reports that he quit smoking about 58 years ago. His smoking use included cigarettes. He started smoking about 74 years ago. He quit after 16.00 years of use. He has never used smokeless tobacco. He reports current alcohol use of about 3.0 standard drinks of alcohol per week. He reports that he does not use drugs.  Family / Support Systems Marital Status: Widow/Widower How Long?: 22 years Patient Roles: Parent, Other (Comment)(grandfather; great grandfather) Children: *Dardan [place] - dtr - [phone]; Ames Dura - dtr - [phone]; Tye [place] - dtr; 3 sons Other Supports: Basilia Jumbo - granddtr - [phone] - leaves 06-05-19 for traveling nurse position in [place] Anticipated Caregiver: children and grandchildren Ability/Limitations of Caregiver: Min A Caregiver Availability: 24/7 Family Dynamics: close, supportive, large family  Social History Preferred language: English Religion: Catholic Read: Yes Write: Yes Employment Status: Retired Public relations account executive Issues: none  reported Guardian/Conservator: MD has determined that pt is capable of making his own decisions.   Abuse/Neglect Abuse/Neglect Assessment Can Be Completed: Yes Physical Abuse: Denies Verbal Abuse: Denies Sexual Abuse: Denies Exploitation of patient/patient's resources: Denies Self-Neglect:  Denies  Emotional Status Pt's affect, behavior and adjustment status: Pt was very sleepy during CSW visit and kept nodding off.  CSW will have to continue to assess for this.  Granddtr reports he's in pretty good spirits. Recent Psychosocial Issues: Pt with pacemaker just placed in the fall, but has been overall pretty healthy per granddtr's report. Psychiatric History: none reported Substance Abuse History: none reported  Patient / Family Perceptions, Expectations & Goals Pt/Family understanding of illness & functional limitations: Family has a good understanding of pt's condition and limitations and are prepared to provide 24/7 supervision. Premorbid pt/family roles/activities: Pre-COVID-19, pt was driving to go out to eat with friends multiple times a week, usher at church, watch TV, take grandchildren out to eat, walk, sit on his porch, watch TV, complete crossword puzzles. Anticipated changes in roles/activities/participation: Pt would like to resume activities as he is able.  Family is aware he will not be cleared to drive at d/c. Pt/family expectations/goals: Pt's family wants pt to be able to get stronger and return to his own home doing as much for himself as he can and eventually live on his own again.  Community [employer] Agencies: None Premorbid Home Care/DME Agencies: Other (Comment)(has shower seat, maybe a walker; went to [facility] for PT, but has never had [facility] in the past.) Transportation available at discharge: family Resource referrals recommended: Neuropsychology, Support group (specify)(stroke support group)  Discharge Planning Living Arrangements: Alone Support Systems: Children, Other relatives, Friends/neighbors, Church/faith community Type of Residence: Private residence Insurance Resources: Multimedia programmer (specify)([employer] Medicare) Financial Resources: Social Security Financial Screen Referred: No Money Management: Patient(son helps with  taxes) Does the patient have any problems obtaining your medications?: No Home Management: Pt was doing this, but children will manage the home. Patient/Family Preliminary Plans: Pt to come home with family members taking turns to be with him for 24/7 supervision. Social Work Anticipated Follow Up Needs: HH/OP, Support Group Expected length of stay: 10 to 14 days  Clinical Impression CSW met with pt and then spoke with his granddtr, [place], via telephone to introduce self and role of CSW, as well as to complete assessment.  Pt was very tired during visit and kept falling asleep. He was agreeable to CSW calling [place].  She has a good hx of pt and obviously cares about him a lot.  Pt will have good family support when he comes home and will have 24/7 supervision pieced together from different family members.  No current concerns or needs.  Family is pleased pt could come to CIR.  Jaclyn Shaggy did ask about MBS and CSW will alert her to that being on pt's schedule for 05-31-19.  She reports that pt is doing better and she and staff felt comfortable with her leaving hospital and pt being on his own now, that he is cleard and in pretty good spirits.  CSW will continue to follow and assist as needed.    Cianni Manny, Silvestre Mesi 05/31/2019, 12:17 AM

## 2019-05-31 NOTE — Progress Notes (Signed)
PHYSICAL MEDICINE & REHABILITATION PROGRESS NOTE   Subjective/Complaints: Patient seen sitting up in a chair this morning.  He states he slept well overnight.  He provides limited interaction.  ROS: Denies CP, SOB, N/V/D  Objective:   No results found. Recent Labs    05/30/19 0727  WBC 12.1*  HGB 15.0  HCT 44.6  PLT 340   Recent Labs    05/30/19 0727  NA 138  K 3.7  CL 103  CO2 25  GLUCOSE 96  BUN 27*  CREATININE 1.04  CALCIUM 8.7*    Intake/Output Summary (Last 24 hours) at 05/31/2019 1046 Last data filed at 05/31/2019 1014 Gross per 24 hour  Intake 540 ml  Output -  Net 540 ml     Physical Exam: Vital Signs Blood pressure 125/72, pulse 60, temperature 98.3 F (36.8 C), temperature source Oral, resp. rate 18, height 5\' 10"  (1.778 m), weight 75.8 kg, SpO2 99 %. Constitutional: No distress . Vital signs reviewed. HENT: Normocephalic.  Atraumatic. Eyes: EOMI.  No discharge. Cardiovascular: No JVD. Respiratory: Normal effort. GI: Non-distended. Musc: No edema or tenderness in extremities. Skin: Clean and intact without signs of breakdown Neuro:  Alert and oriented Motor: RUE/RLE: 5/5 proximal to distal LUE/LLE: 4+/5 proximal to distal, stable Psych: Slow to process  Assessment/Plan: 1. Functional deficits secondary to right MCA infarct which require 3+ hours per day of interdisciplinary therapy in a comprehensive inpatient rehab setting.  Physiatrist is providing close team supervision and 24 hour management of active medical problems listed below.  Physiatrist and rehab team continue to assess barriers to discharge/monitor patient progress toward functional and medical goals  Care Tool:  Bathing    Body parts bathed by patient: Right arm, Left arm, Chest, Abdomen, Front perineal area, Buttocks, Right upper leg, Left upper leg, Face   Body parts bathed by helper: Right lower leg, Left lower leg     Bathing assist Assist Level: Minimal  Assistance - Patient > 75%     Upper Body Dressing/Undressing Upper body dressing   What is the patient wearing?: Pull over shirt    Upper body assist Assist Level: Minimal Assistance - Patient > 75%    Lower Body Dressing/Undressing Lower body dressing      What is the patient wearing?: Pants, Incontinence brief     Lower body assist Assist for lower body dressing: Moderate Assistance - Patient 50 - 74%     Toileting Toileting    Toileting assist Assist for toileting: Moderate Assistance - Patient 50 - 74%     Transfers Chair/bed transfer  Transfers assist     Chair/bed transfer assist level: Moderate Assistance - Patient 50 - 74%     Locomotion Ambulation   Ambulation assist      Assist level: Moderate Assistance - Patient 50 - 74% Assistive device: Hand held assist Max distance: 5'   Walk 10 feet activity   Assist  Walk 10 feet activity did not occur: Safety/medical concerns        Walk 50 feet activity   Assist Walk 50 feet with 2 turns activity did not occur: Safety/medical concerns         Walk 150 feet activity   Assist Walk 150 feet activity did not occur: Safety/medical concerns         Walk 10 feet on uneven surface  activity   Assist Walk 10 feet on uneven surfaces activity did not occur: Safety/medical concerns  Wheelchair     Assist Will patient use wheelchair at discharge?: No   Wheelchair activity did not occur: N/A         Wheelchair 50 feet with 2 turns activity    Assist    Wheelchair 50 feet with 2 turns activity did not occur: N/A       Wheelchair 150 feet activity     Assist Wheelchair 150 feet activity did not occur: N/A        Medical Problem List and Plan: 1.Dysarthria, right gaze deviation with decreased functional mobilitysecondary to right MCA infarction  Cont CIR 2. Antithrombotics: -DVT/anticoagulation:Eliquis -antiplatelet therapy: N/A 3.  Pain Management:Tylenol as needed 4. Mood:Provide emotional support -antipsychotic agents: N/A 5. Neuropsych: This patientiscapable of making decisions on hisown behalf. 6. Skin/Wound Care:Routine skin checks 7. Fluids/Electrolytes/Nutrition:  BMP within acceptable range on 6/29  Encourage fluids 8. Dysphagia.   D2 honey,MBS planned for today  Continue to advance as tolerated 9. Complete heart block. 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  10. New onset atrial fibrillation.  Continue Eliquis as well as initiation of Toprol-XL 25 mg daily  HR controlled 6/30 11. Hyperlipidemia. Crestor 12. BPH. Proscar 5 mg daily 13. Hypertension. Patient on lisinopril 20 mg daily prior to admission      Controlled on 6/30 14. GERD. Protonix 15. Leukocytosis:  WBCs 12.1 on 6/29  Afebrile  Cont to monitor 16.CKD. Baseline creatinine 1.28-1.35.   Cr. 1.04 on 6/29  Cont to monitor    LOS: 4 days A FACE TO FACE EVALUATION WAS PERFORMED  Jose Castro Lorie Phenix 05/31/2019, 10:46 AM

## 2019-05-31 NOTE — Progress Notes (Signed)
Occupational Therapy Session Note  Patient Details  Name: Jose Castro MRN: [id number] Date of Birth: 09-14-1926  Today's Date: 05/31/2019 OT Individual Time: 5997-7414 OT Individual Time Calculation (min): 40 min    Short Term Goals: Week 1:  OT Short Term Goal 1 (Week 1): Pt will perform 3/3 grooming tasks with mininmal cuing with supervision OT Short Term Goal 2 (Week 1): Pt will perform UB dressing with supervision OT Short Term Goal 3 (Week 1): Pt will perform LB dressing wiht min A OT Short Term Goal 4 (Week 1): Pt will scan to the left field with minimal cuing to obtain self care items  Skilled Therapeutic Interventions/Progress Updates:    Pt received asleep requiring mod cueing to wake up. Pt willing to participate in therapy session. Pt transitioned to EOB with mod A. Cueing required for UE palcement/positioning during squat pivot transfer. Mod A overall required. Pt sat in w/c at sink and completed oral care. Pt required cueing to correctly use fixodent and to orient dentures in mouth. Pt unable to problem solve through dentures being upside down in mouth.  Pt completed stand pivot transfer to [facility] with use of grab bars with min A. Urinary incontinence in brief. With grab bar pt able to stand from [facility] with CGA. He requried mod A overall ton don pants and incontinence brief. Pt left sitting up in the w/c with chair alarm belt fastened.   Therapy Documentation Precautions:  Precautions Precautions: None Precaution Comments: swallowing Restrictions Weight Bearing Restrictions: No   Therapy/Group: Individual Therapy  Curtis Sites 05/31/2019, 7:18 AM

## 2019-06-01 ENCOUNTER — Inpatient Hospital Stay (HOSPITAL_COMMUNITY): Payer: Medicare Other | Admitting: Speech Pathology

## 2019-06-01 ENCOUNTER — Inpatient Hospital Stay (HOSPITAL_COMMUNITY): Payer: Medicare Other | Admitting: Physical Therapy

## 2019-06-01 ENCOUNTER — Inpatient Hospital Stay (HOSPITAL_COMMUNITY): Payer: Medicare Other | Admitting: Occupational Therapy

## 2019-06-01 ENCOUNTER — Inpatient Hospital Stay (HOSPITAL_COMMUNITY): Payer: Medicare Other

## 2019-06-01 LAB — URINALYSIS, ROUTINE W REFLEX MICROSCOPIC
Bilirubin Urine: NEGATIVE
Glucose, UA: NEGATIVE mg/dL
Hgb urine dipstick: NEGATIVE
Ketones, ur: NEGATIVE mg/dL
Leukocytes,Ua: NEGATIVE
Nitrite: NEGATIVE
Protein, ur: 30 mg/dL — AB
Specific Gravity, Urine: 1.016 (ref 1.005–1.030)
pH: 6 (ref 5.0–8.0)

## 2019-06-01 LAB — CBC WITH DIFFERENTIAL/PLATELET
Basophils Absolute: 0.1 10*3/uL (ref 0.0–0.1)
Basophils Relative: 1 %
Eosinophils Absolute: 0.3 10*3/uL (ref 0.0–0.5)
Eosinophils Relative: 2 %
HCT: 49.8 % (ref 39.0–52.0)
Hemoglobin: 16.2 g/dL (ref 13.0–17.0)
Lymphocytes Relative: 18 %
Lymphs Abs: 2.5 10*3/uL (ref 0.7–4.0)
MCH: 29.4 pg (ref 26.0–34.0)
MCHC: 32.5 g/dL (ref 30.0–36.0)
MCV: 90.4 fL (ref 80.0–100.0)
Monocytes Absolute: 1.1 10*3/uL — ABNORMAL HIGH (ref 0.1–1.0)
Monocytes Relative: 8 %
Neutro Abs: 10.2 10*3/uL — ABNORMAL HIGH (ref 1.7–7.7)
Neutrophils Relative %: 71 %
Platelets: 435 10*3/uL — ABNORMAL HIGH (ref 150–400)
RBC: 5.15 MIL/uL (ref 4.22–5.81)
RDW: 13.6 % (ref 11.5–15.5)
WBC: 14.5 10*3/uL — ABNORMAL HIGH (ref 4.0–10.5)

## 2019-06-01 NOTE — Progress Notes (Signed)
Occupational Therapy Session Note  Patient Details  Name: Jose Castro MRN: [id number] Date of Birth: 1926-11-19  Today's Date: 06/01/2019 OT Individual Time: 1561-5379 OT Individual Time Calculation (min): 53 min (missed 22 mins)   Short Term Goals: Week 1:  OT Short Term Goal 1 (Week 1): Pt will perform 3/3 grooming tasks with mininmal cuing with supervision OT Short Term Goal 2 (Week 1): Pt will perform UB dressing with supervision OT Short Term Goal 3 (Week 1): Pt will perform LB dressing wiht min A OT Short Term Goal 4 (Week 1): Pt will scan to the left field with minimal cuing to obtain self care items  Skilled Therapeutic Interventions/Progress Updates:    Treatment session with focus on ADL retraining with bathing/dressing and increased attention/problem solving.  Pt received upright in w/c asleep with difficulty to arouse.  Requiring increased time and multiple attempts to fully arouse.  Once fully aroused, pt able to engage in bathing at sink (did not shower due to previous lethargy with difficulty to arouse).  Pt required cues to visually scan to Lt environment to locate items while engaging in bathing and dressing.  Pt demonstrated decreased organization and orientation of clothing when donning shirt, as well as decreased awareness of errors.  Pt declined LB bathing/dressing this session.  Transport arrived to take pt to x-ray.   Pt with increased lethargy this session with difficulty arousing and maintaining aroused.  Notified PA.  Therapy Documentation Precautions:  Precautions Precautions: None Precaution Comments: swallowing Restrictions Weight Bearing Restrictions: No Pain:  Pt with no c/o pain   Therapy/Group: Individual Therapy  [name] 06/01/2019, 9:27 AM

## 2019-06-01 NOTE — Progress Notes (Signed)
[place] PHYSICAL MEDICINE & REHABILITATION PROGRESS NOTE   Subjective/Complaints: Working with physical therapy.  Moving right side well.  Good sitting balance.  Appreciate MBS results.  Patient denies any new issues, he is incontinent of bladder.  PT notes field cut on the left side  ROS: Denies CP, SOB, N/V/D  Objective:   Dg Swallowing Func-speech Pathology  Result Date: 05/31/2019 Objective Swallowing Evaluation: Type of Study: Bedside Swallow Evaluation  Patient Details Name: VIKASH NEST MRN: [id number] Date of Birth: Nov 15, 1926 Today's Date: 05/31/2019 Time: SLP Start Time (ACUTE ONLY): 80 -SLP Stop Time (ACUTE ONLY): 1045 SLP Time Calculation (min) (ACUTE ONLY): 40 min Past Medical History: Past Medical History: Diagnosis Date . Arthritis  . BPH (benign prostatic hyperplasia)  . Chronic kidney disease   had a kidney stone which per family was a cyst that was removed . Dysrhythmia  . GERD (gastroesophageal reflux disease)  . History of kidney stones  . Hyperlipidemia  . Hypertension  . Splenic vein thrombosis  . Stroke ([place]) 05/22/2019 . Thrombosis 12/2015  mural  area and no notation of heart attack Past Surgical History: Past Surgical History: Procedure Laterality Date . APPENDECTOMY   . CATARACT EXTRACTION, BILATERAL   . CYSTOSCOPY WITH INSERTION OF UROLIFT   . KIDNEY STONE SURGERY   . KNEE SURGERY   . PACEMAKER INSERTION N/A 07/13/2018  Procedure: INSERTION PACEMAKER-DUEL CHAMBER INITIAL IMPLANT;  Surgeon: Isaias Cowman, MD;  Location: [facility] ORS;  Service: Cardiovascular;  Laterality: N/A; . SPINE SURGERY   HPI: Pt is a 85 y.o. male with a known history of hypertension, GERD, hyperlipidemia, pacemaker placement d/t bradycardia, and other multiple medical issues per chart notes.  Patient presented to the emergency room after being found on the floor of his home earlier in the day.  Patient was last seen well at 1 PM on yesterday.  EMS reported left fixed eye deviation to the right.  This  has improved slightly by the time I am seeing the patient.  Patient's granddaughter is at the bedside.  Patient is awake, alert, oriented to person, place, and time.  However, he does not recall events surrounding the last 24 hours.  He is somewhat of a poor historian with slight confusion at times.  Today, pt is awake(had been min lethargic s/p admission), A/O x4. He worked w/ OT during session following all commands. He appears to fatigue easily.  Pt appears min HOH.  Subjective: pt awake, sitting upright in bed post OT session. Pt verbally conversive w/ OT, SLP; Granddaughter in room also. He answered questions; A/O x4. Noted min fatigue as session continued. Assessment / Plan / Recommendation CHL IP CLINICAL IMPRESSIONS 05/31/2019 Clinical Impression Pt presents with mild oral phase deficits c/b decreased lingual manipulation, decreased bolus cohesion which resulted in prolonged oral phase espceivally with solids. Additionally, residue can be seen under tongue on fluro with ~ 2 swallows per bolus to effectively clear oral cavity. Pt's pharyngeal phase appears appropriate in initiation with slight penetration during swallow. Pt with one instance of sensed aspiration but otherwise he was able to establish ocmplete airway protection. Recommend pt continue dysphagia 2 with upgrade to thin liquids, medicine whole in puree and full supervision. SLP Visit Diagnosis Dysphagia, oropharyngeal phase (R13.12) Attention and concentration deficit following -- Frontal lobe and executive function deficit following -- Impact on safety and function Mild aspiration risk   CHL IP TREATMENT RECOMMENDATION 05/31/2019 Treatment Recommendations Therapy as outlined in treatment plan below   Prognosis 05/24/2019 Prognosis  for Safe Diet Advancement Fair Barriers to Reach Goals (No Data) Barriers/Prognosis Comment -- CHL IP DIET RECOMMENDATION 05/31/2019 SLP Diet Recommendations Dysphagia 2 (Fine chop) solids;Thin liquid Liquid Administration  via Cup;Straw Medication Administration Whole meds with puree Compensations Minimize environmental distractions;Slow rate;Small sips/bites Postural Changes Seated upright at 90 degrees   CHL IP OTHER RECOMMENDATIONS 05/31/2019 Recommended Consults -- Oral Care Recommendations Oral care BID Other Recommendations --   CHL IP FOLLOW UP RECOMMENDATIONS 05/31/2019 Follow up Recommendations Inpatient Rehab   CHL IP FREQUENCY AND DURATION 05/24/2019 Speech Therapy Frequency (ACUTE ONLY) min 3x week Treatment Duration 2 weeks      CHL IP ORAL PHASE 05/31/2019 Oral Phase Impaired Oral - Pudding Teaspoon -- Oral - Pudding Cup -- Oral - Honey Teaspoon -- Oral - Honey Cup -- Oral - Nectar Teaspoon Weak lingual manipulation;Delayed oral transit Oral - Nectar Cup Weak lingual manipulation;Delayed oral transit Oral - Nectar Straw -- Oral - Thin Teaspoon Weak lingual manipulation Oral - Thin Cup Weak lingual manipulation;Other (Comment) Oral - Thin Straw Weak lingual manipulation;Other (Comment) Oral - Puree -- Oral - Mech Soft -- Oral - Regular -- Oral - Multi-Consistency Weak lingual manipulation;Impaired mastication;Reduced posterior propulsion;Decreased bolus cohesion;Delayed oral transit;Premature spillage Oral - Pill -- Oral Phase - Comment --  CHL IP PHARYNGEAL PHASE 05/31/2019 Pharyngeal Phase Impaired Pharyngeal- Pudding Teaspoon -- Pharyngeal -- Pharyngeal- Pudding Cup -- Pharyngeal -- Pharyngeal- Honey Teaspoon -- Pharyngeal -- Pharyngeal- Honey Cup -- Pharyngeal -- Pharyngeal- Nectar Teaspoon WFL Pharyngeal -- Pharyngeal- Nectar Cup WFL Pharyngeal -- Pharyngeal- Nectar Straw -- Pharyngeal -- Pharyngeal- Thin Teaspoon WFL Pharyngeal -- Pharyngeal- Thin Cup WFL Pharyngeal -- Pharyngeal- Thin Straw WFL Pharyngeal -- Pharyngeal- Puree -- Pharyngeal -- Pharyngeal- Mechanical Soft -- Pharyngeal -- Pharyngeal- Regular -- Pharyngeal -- Pharyngeal- Multi-consistency WFL Pharyngeal -- Pharyngeal- Pill -- Pharyngeal -- Pharyngeal  Comment --  CHL IP CERVICAL ESOPHAGEAL PHASE 05/31/2019 Cervical Esophageal Phase WFL Pudding Teaspoon -- Pudding Cup -- Honey Teaspoon -- Honey Cup -- Nectar Teaspoon -- Nectar Cup -- Nectar Straw -- Thin Teaspoon -- Thin Cup -- Thin Straw -- Puree -- Mechanical Soft -- Regular -- Multi-consistency -- Pill -- Cervical Esophageal Comment -- Happi Overton 05/31/2019, 3:32 PM              Recent Labs    05/30/19 0727  WBC 12.1*  HGB 15.0  HCT 44.6  PLT 340   Recent Labs    05/30/19 0727  NA 138  K 3.7  CL 103  CO2 25  GLUCOSE 96  BUN 27*  CREATININE 1.04  CALCIUM 8.7*    Intake/Output Summary (Last 24 hours) at 06/01/2019 0705 Last data filed at 05/31/2019 2006 Gross per 24 hour  Intake 360 ml  Output -  Net 360 ml     Physical Exam: Vital Signs Blood pressure 128/77, pulse 61, temperature 98.3 F (36.8 C), resp. rate 20, height 5' 10"  (1.778 m), weight 75.8 kg, SpO2 94 %. Constitutional: No distress . Vital signs reviewed. HENT: Normocephalic.  Atraumatic. Eyes: EOMI.  No discharge. Cardiovascular: No JVD. Respiratory: Normal effort. GI: Non-distended. Musc: No edema or tenderness in extremities. Skin: Clean and intact without signs of breakdown Neuro:  Alert and oriented Motor: RUE/RLE: 5/5 proximal to distal Dense left homonymous hemianopsia. LUE/LLE: 4+/5 proximal to distal, stable Psych: Slow to process but answers appropriately with normal affect  Assessment/Plan: 1. Functional deficits secondary to right MCA infarct which require 3+ hours per day of interdisciplinary therapy in a comprehensive  inpatient rehab setting.  Physiatrist is providing close team supervision and 24 hour management of active medical problems listed below.  Physiatrist and rehab team continue to assess barriers to discharge/monitor patient progress toward functional and medical goals  Care Tool:  Bathing    Body parts bathed by patient: Right arm, Left arm, Chest, Abdomen, Front  perineal area, Buttocks, Right upper leg, Left upper leg, Face   Body parts bathed by helper: Right lower leg, Left lower leg     Bathing assist Assist Level: Minimal Assistance - Patient > 75%     Upper Body Dressing/Undressing Upper body dressing   What is the patient wearing?: Pull over shirt    Upper body assist Assist Level: Minimal Assistance - Patient > 75%    Lower Body Dressing/Undressing Lower body dressing      What is the patient wearing?: Pants, Incontinence brief     Lower body assist Assist for lower body dressing: Moderate Assistance - Patient 50 - 74%     Toileting Toileting    Toileting assist Assist for toileting: Moderate Assistance - Patient 50 - 74%     Transfers Chair/bed transfer  Transfers assist     Chair/bed transfer assist level: Moderate Assistance - Patient 50 - 74%     Locomotion Ambulation   Ambulation assist      Assist level: Moderate Assistance - Patient 50 - 74% Assistive device: Hand held assist Max distance: 5'   Walk 10 feet activity   Assist  Walk 10 feet activity did not occur: Safety/medical concerns        Walk 50 feet activity   Assist Walk 50 feet with 2 turns activity did not occur: Safety/medical concerns         Walk 150 feet activity   Assist Walk 150 feet activity did not occur: Safety/medical concerns         Walk 10 feet on uneven surface  activity   Assist Walk 10 feet on uneven surfaces activity did not occur: Safety/medical concerns         Wheelchair     Assist Will patient use wheelchair at discharge?: No   Wheelchair activity did not occur: N/A         Wheelchair 50 feet with 2 turns activity    Assist    Wheelchair 50 feet with 2 turns activity did not occur: N/A       Wheelchair 150 feet activity     Assist Wheelchair 150 feet activity did not occur: N/A        Medical Problem List and Plan: 1.Dysarthria, right gaze deviation with  decreased functional mobilitysecondary to right MCA infarction Team conference today please see physician documentation under team conference tab, met with team face-to-face to discuss problems,progress, and goals. Formulized individual treatment plan based on medical history, underlying problem and comorbidities. 2. Antithrombotics: -DVT/anticoagulation:Eliquis -antiplatelet therapy: N/A 3. Pain Management:Tylenol as needed 4. Mood:Provide emotional support -antipsychotic agents: N/A 5. Neuropsych: This patientiscapable of making decisions on hisown behalf. 6. Skin/Wound Care:Routine skin checks 7. Fluids/Electrolytes/Nutrition:  BMP within acceptable range on 6/29  Encourage fluids 8. Dysphagia.   D2 thin- upgrade per SLP  Continue to advance as tolerated 9. Complete heart block. 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  10. New onset atrial fibrillation.  Continue Eliquis as well as initiation of Toprol-XL 25 mg daily Vitals:   05/31/19 1924 06/01/19 0439  BP: (!) 107/59 128/77  Pulse: 60 61  Resp: 20 20  Temp:  98.5 F (36.9 C) 98.3 F (36.8 C)  SpO2: 95% 94%   11. Hyperlipidemia. Crestor 12. BPH. Proscar 5 mg daily 13. Hypertension. Patient on lisinopril 20 mg daily prior to admission      Controlled on 7/1 14. GERD. Protonix 15. Leukocytosis:  WBCs 12.1 on 6/29  Afebrile  Cont to monitor 16.CKD. Baseline creatinine 1.28-1.35.   Cr. 1.04 on 6/29  Cont to monitor    LOS: 5 days A FACE TO FACE EVALUATION WAS PERFORMED  Charlett Blake 06/01/2019, 7:05 AM

## 2019-06-01 NOTE — Progress Notes (Signed)
Occupational Therapy Session Note  Patient Details  Name: Jose Castro MRN: [id number] Date of Birth: [birth year]  Today's Date: 06/01/2019 OT Individual Time: 1335-1430 OT Individual Time Calculation (min): 55 min    Short Term Goals: Week 1:  OT Short Term Goal 1 (Week 1): Pt will perform 3/3 grooming tasks with mininmal cuing with supervision OT Short Term Goal 2 (Week 1): Pt will perform UB dressing with supervision OT Short Term Goal 3 (Week 1): Pt will perform LB dressing wiht min A OT Short Term Goal 4 (Week 1): Pt will scan to the left field with minimal cuing to obtain self care items  Skilled Therapeutic Interventions/Progress Updates:    Treatment session with focus on dynamic standing balance and Lt attention/awareness.  Pt received upright finishing lunch.  Min cues to locate items in Lt visual field.  Engaged in table top task in standing with focus on standing balance and activity tolerance as needed for LB bathing and dressing tasks.  CGA for standing balance with pt tolerating standing 7-8 mins at a time.  Mod cues for visual scanning and problem solving while completing jigsaw puzzle.  Pt reports typically enjoys jigsaw puzzles and able to recognize that this was more challenging than normal.  Pt completed all stand pivot transfers with RW CGA throughout session.  Pt left upright in w/c with seat belt alarm on and all needs in reach.  Therapy Documentation Precautions:  Precautions Precautions: None Precaution Comments: swallowing Restrictions Weight Bearing Restrictions: No General: General OT Amount of Missed Time: 22 Minutes Vital Signs: Therapy Vitals Temp: 97.8 F (36.6 C) Temp Source: Oral Pulse Rate: 62 Resp: 18 BP: 124/73 Patient Position (if appropriate): Sitting Oxygen Therapy SpO2: (!) 61 % O2 Device: Room Air Pain:  Pt with no c/o pain   Therapy/Group: Individual Therapy  [name] 06/01/2019, 2:44 PM

## 2019-06-01 NOTE — Plan of Care (Signed)
  Problem: RH Expression Communication Goal: LTG Patient will increase speech intelligibility (SLP) Description: LTG: Patient will increase speech intelligibility at word/phrase/conversation level with cues, % of the time (SLP) Outcome: Completed/Met   Problem: RH Problem Solving Goal: LTG Patient will demonstrate problem solving for (SLP) Description: LTG:  Patient will demonstrate problem solving for basic/complex daily situations with cues  (SLP) Flowsheets (Taken 06/01/2019 1326) LTG: Patient will demonstrate problem solving for (SLP): Complex daily situations LTG Patient will demonstrate problem solving for: Minimal Assistance - Patient > 75%   Problem: RH Awareness Goal: LTG: Patient will demonstrate awareness during functional activites type of (SLP) Description: LTG: Patient will demonstrate awareness during functional activites type of (SLP) Flowsheets (Taken 06/01/2019 1326) Patient will demonstrate during cognitive/linguistic activities awareness type of: Anticipatory LTG: Patient will demonstrate awareness during cognitive/linguistic activities with assistance of (SLP): Minimal Assistance - Patient > 75%   Problem: RH Swallowing Goal: LTG Patient will consume least restrictive diet using compensatory strategies with assistance (SLP) Description: LTG:  Patient will consume least restrictive diet using compensatory strategies with assistance (SLP) Flowsheets (Taken 06/01/2019 1326) LTG: Pt Patient will consume least restrictive diet using compensatory strategies with assistance of (SLP): Supervision Goal: LTG Patient will participate in dysphagia therapy to increase swallow function with assistance (SLP) Description: LTG:  Patient will participate in dysphagia therapy to increase swallow function with assistance (SLP) Flowsheets (Taken 06/01/2019 1326) LTG: Pt will participate in dysphagia therapy to increase swallow function with assistance of (SLP): Supervision

## 2019-06-01 NOTE — Progress Notes (Signed)
Speech Language Pathology Daily Session Note  Patient Details  Name: Jose Castro MRN: [id number] Date of Birth: [birth year]  Today's Date: 06/01/2019 SLP Individual Time: 1210-1240 SLP Individual Time Calculation (min): 30 min  Short Term Goals: Week 1: SLP Short Term Goal 1 (Week 1): Patient will utilize external memory aids to recall new daily information with min A verbal cues. SLP Short Term Goal 2 (Week 1): Patient will consume dysphagia 3 textures and thin liquids without overt s/sx of aspiration to demonstrate readiness for upgrade. SLP Short Term Goal 2 - Progress (Week 1): Updated due to goal met SLP Short Term Goal 3 (Week 1): Patient will uitlize speech intelligilbity strategies at the conversation level with min verbal cues to achieve 80% intelligibility. SLP Short Term Goal 3 - Progress (Week 1): Met SLP Short Term Goal 4 (Week 1): Patient will demonstrate functional problem solving for basic and familiar tasks with min A verbal cues. SLP Short Term Goal 5 (Week 1): Pt will self-monitor and self-correct errors within functional tasks with  Mod A cues. SLP Short Term Goal 6 (Week 1): Given Min A, pt will scan to left of his environment to locate items within problem solving tasks.  Skilled Therapeutic Interventions:  Skilled treatment session focused on cognition goals. SLP facilitated session by providing semi-complex problem solving tasks such as symbol cancellation. Pt with noticeable left inattention and no awareness of errors. Additionally, pt with lengthy response times (no related to being [facility]). He stated he was "just thinking." STGs adjusted to reflect current areas of deficit. Pt left upright in wheelchair, lap belt alarm on and all needs within reach. Continue per current plan of care.      Pain    Therapy/Group: Individual Therapy  [name] 06/01/2019, 1:22 PM

## 2019-06-01 NOTE — Progress Notes (Signed)
OT with concerns that patient is different today and was lethargic and difficult to arouse.  On exam--patient sitting up in chair, alert, a little slow to respond but interactive. He had 45 minutes PT session this am and question fatigue after session. He reports good sleep. He was advanced to thins a couple of days ago--no fevers and leucocytosis resolving. AKI improved. Will check CXR, UA/UCS and repeat labs for follow up. Frequent checks ordered

## 2019-06-01 NOTE — Progress Notes (Signed)
Physical Therapy Session Note  Patient Details  Name: Jose Castro MRN: [id number] Date of Birth: 08-07-1926  Today's Date: 06/01/2019 PT Individual Time: 5784-6962 PT Individual Time Calculation (min): 43 min   Short Term Goals: Week 1:  PT Short Term Goal 1 (Week 1): Pt will ambulate 25' w/ CGA w/ LRAD PT Short Term Goal 2 (Week 1): Pt will maintain dynamic standing balance w/ min assist PT Short Term Goal 3 (Week 1): Pt will attend to L environment w/ min cues 50% of the time during functional mobility PT Short Term Goal 4 (Week 1): Pt will perform bed<>chair transfers w/ CGA  Skilled Therapeutic Interventions/Progress Updates:    pt rec'd in bed. Requires min A for bed mobility from flat bed with no handrails.  Initial min A to stand, progresses to supervision with repetition.  Standing balance training without AD for LE strength and balance with min/mod A for mini squats, side stepping, backward stepping.gait with RW x 100' with supervision.  Pt performs stair negotiation with 1 handrail 3 x 4 stairs with min A to ascend stairs, CGA for descending stairs.  Pt left in room with alarm set, needs at hand.  Therapy Documentation Precautions:  Precautions Precautions: None Precaution Comments: swallowing Restrictions Weight Bearing Restrictions: No Pain:  no c/o pain   Therapy/Group: Individual Therapy  [name] 06/01/2019, 8:15 AM

## 2019-06-01 NOTE — Plan of Care (Signed)
  Problem: Consults Goal: RH STROKE PATIENT EDUCATION Description: See Patient Education module for education specifics  Outcome: Progressing   Problem: RH SKIN INTEGRITY Goal: RH STG MAINTAIN SKIN INTEGRITY WITH ASSISTANCE Description: STG Maintain Skin Integrity With min Assistance. Outcome: Progressing   Problem: RH SAFETY Goal: RH STG ADHERE TO SAFETY PRECAUTIONS W/ASSISTANCE/DEVICE Description: STG Adhere to Safety Precautions With min Assistance/Device. Outcome: Progressing

## 2019-06-02 ENCOUNTER — Inpatient Hospital Stay (HOSPITAL_COMMUNITY): Payer: Medicare Other

## 2019-06-02 ENCOUNTER — Inpatient Hospital Stay (HOSPITAL_COMMUNITY): Payer: Medicare Other | Admitting: Occupational Therapy

## 2019-06-02 ENCOUNTER — Encounter (HOSPITAL_COMMUNITY): Payer: Self-pay | Admitting: *Deleted

## 2019-06-02 LAB — URINE CULTURE: Culture: 50000 — AB

## 2019-06-02 NOTE — Progress Notes (Signed)
Occupational Therapy Session Note  Patient Details  Name: Jose Castro MRN: [id number] Date of Birth: [birth year]  Today's Date: 06/02/2019 OT Individual Time: 1230-1300 OT Individual Time Calculation (min): 30 min    Skilled Therapeutic Interventions/Progress Updates:    Pt seen for OT session focusing on functional problem solving and L attention during functional task. Pt sitting up in w/c upon arrival, agreeable to tx session and denying pain.  Pt provided with lunch tray, able to locate all needed items L of midline. Pt then refusing to eat lunch without ketchup. Unable to locate kethcup on unit and pt refusing to eat. Therefore, worked on functional problem solving to call to cafeteria to request. Max A to locate phone number in stimulating menu and to dial numbers. x2 attempts to call with pt being disconnected, however, pt without awareness to being disconnected with change in dial tone and ultimately therapist calling to make request.  With encouragement, pt willing to try some parts of lunch.  Pt left seated in w/c at end of session, all needs in reach and working on eating lunch.   Therapy Documentation Precautions:  Precautions Precautions: None Precaution Comments: swallowing Restrictions Weight Bearing Restrictions: No   Therapy/Group: Individual Therapy  Midori Dado L 06/02/2019, 1:27 PM

## 2019-06-02 NOTE — Progress Notes (Addendum)
[place] PHYSICAL MEDICINE & REHABILITATION PROGRESS NOTE   Subjective/Complaints:  Patient remembers me from yesterday no complaints this morning. Lethargy was of brief duration yesterday  after therapy session  ROS: Denies CP, SOB, N/V/D  Objective:   Dg Chest 2 View  Result Date: 06/01/2019 CLINICAL DATA:  Lethargic today. EXAM: CHEST - 2 VIEW COMPARISON:  05/24/2019 FINDINGS: Dual lead pacemaker appears the same. Heart size is normal. Chronic thoracic aortic atherosclerosis and tortuosity. The lungs are clear. The vascularity is normal. No effusions. IMPRESSION: No active cardiopulmonary disease. Electronically Signed   By: Nelson Chimes M.D.   On: 06/01/2019 10:30   Dg Swallowing Func-speech Pathology  Result Date: 05/31/2019 Objective Swallowing Evaluation: Type of Study: Bedside Swallow Evaluation  Patient Details Name: Jose Castro MRN: [id number] Date of Birth: [birth year] Today's Date: 05/31/2019 Time: SLP Start Time (ACUTE ONLY): 31 -SLP Stop Time (ACUTE ONLY): 1045 SLP Time Calculation (min) (ACUTE ONLY): 40 min Past Medical History: Past Medical History: Diagnosis Date . Arthritis  . BPH (benign prostatic hyperplasia)  . Chronic kidney disease   had a kidney stone which per family was a cyst that was removed . Dysrhythmia  . GERD (gastroesophageal reflux disease)  . History of kidney stones  . Hyperlipidemia  . Hypertension  . Splenic vein thrombosis  . Stroke ([place]) 05/22/2019 . Thrombosis 12/2015  mural  area and no notation of heart attack Past Surgical History: Past Surgical History: Procedure Laterality Date . APPENDECTOMY   . CATARACT EXTRACTION, BILATERAL   . CYSTOSCOPY WITH INSERTION OF UROLIFT   . KIDNEY STONE SURGERY   . KNEE SURGERY   . PACEMAKER INSERTION N/A 07/13/2018  Procedure: INSERTION PACEMAKER-DUEL CHAMBER INITIAL IMPLANT;  Surgeon: Isaias Cowman, MD;  Location: [facility] ORS;  Service: Cardiovascular;  Laterality: N/A; . SPINE SURGERY   HPI: Pt is a 85 y.o. male with  a known history of hypertension, GERD, hyperlipidemia, pacemaker placement d/t bradycardia, and other multiple medical issues per chart notes.  Patient presented to the emergency room after being found on the floor of his home earlier in the day.  Patient was last seen well at 1 PM on yesterday.  EMS reported left fixed eye deviation to the right.  This has improved slightly by the time I am seeing the patient.  Patient's granddaughter is at the bedside.  Patient is awake, alert, oriented to person, place, and time.  However, he does not recall events surrounding the last 24 hours.  He is somewhat of a poor historian with slight confusion at times.  Today, pt is awake(had been min lethargic s/p admission), A/O x4. He worked w/ OT during session following all commands. He appears to fatigue easily.  Pt appears min HOH.  Subjective: pt awake, sitting upright in bed post OT session. Pt verbally conversive w/ OT, SLP; Granddaughter in room also. He answered questions; A/O x4. Noted min fatigue as session continued. Assessment / Plan / Recommendation CHL IP CLINICAL IMPRESSIONS 05/31/2019 Clinical Impression Pt presents with mild oral phase deficits c/b decreased lingual manipulation, decreased bolus cohesion which resulted in prolonged oral phase espceivally with solids. Additionally, residue can be seen under tongue on fluro with ~ 2 swallows per bolus to effectively clear oral cavity. Pt's pharyngeal phase appears appropriate in initiation with slight penetration during swallow. Pt with one instance of sensed aspiration but otherwise he was able to establish ocmplete airway protection. Recommend pt continue dysphagia 2 with upgrade to thin liquids, medicine whole in puree and  full supervision. SLP Visit Diagnosis Dysphagia, oropharyngeal phase (R13.12) Attention and concentration deficit following -- Frontal lobe and executive function deficit following -- Impact on safety and function Mild aspiration risk   CHL IP  TREATMENT RECOMMENDATION 05/31/2019 Treatment Recommendations Therapy as outlined in treatment plan below   Prognosis 05/24/2019 Prognosis for Safe Diet Advancement Fair Barriers to Reach Goals (No Data) Barriers/Prognosis Comment -- CHL IP DIET RECOMMENDATION 05/31/2019 SLP Diet Recommendations Dysphagia 2 (Fine chop) solids;Thin liquid Liquid Administration via Cup;Straw Medication Administration Whole meds with puree Compensations Minimize environmental distractions;Slow rate;Small sips/bites Postural Changes Seated upright at 90 degrees   CHL IP OTHER RECOMMENDATIONS 05/31/2019 Recommended Consults -- Oral Care Recommendations Oral care BID Other Recommendations --   CHL IP FOLLOW UP RECOMMENDATIONS 05/31/2019 Follow up Recommendations Inpatient Rehab   CHL IP FREQUENCY AND DURATION 05/24/2019 Speech Therapy Frequency (ACUTE ONLY) min 3x week Treatment Duration 2 weeks      CHL IP ORAL PHASE 05/31/2019 Oral Phase Impaired Oral - Pudding Teaspoon -- Oral - Pudding Cup -- Oral - Honey Teaspoon -- Oral - Honey Cup -- Oral - Nectar Teaspoon Weak lingual manipulation;Delayed oral transit Oral - Nectar Cup Weak lingual manipulation;Delayed oral transit Oral - Nectar Straw -- Oral - Thin Teaspoon Weak lingual manipulation Oral - Thin Cup Weak lingual manipulation;Other (Comment) Oral - Thin Straw Weak lingual manipulation;Other (Comment) Oral - Puree -- Oral - Mech Soft -- Oral - Regular -- Oral - Multi-Consistency Weak lingual manipulation;Impaired mastication;Reduced posterior propulsion;Decreased bolus cohesion;Delayed oral transit;Premature spillage Oral - Pill -- Oral Phase - Comment --  CHL IP PHARYNGEAL PHASE 05/31/2019 Pharyngeal Phase Impaired Pharyngeal- Pudding Teaspoon -- Pharyngeal -- Pharyngeal- Pudding Cup -- Pharyngeal -- Pharyngeal- Honey Teaspoon -- Pharyngeal -- Pharyngeal- Honey Cup -- Pharyngeal -- Pharyngeal- Nectar Teaspoon WFL Pharyngeal -- Pharyngeal- Nectar Cup WFL Pharyngeal -- Pharyngeal- Nectar  Straw -- Pharyngeal -- Pharyngeal- Thin Teaspoon WFL Pharyngeal -- Pharyngeal- Thin Cup WFL Pharyngeal -- Pharyngeal- Thin Straw WFL Pharyngeal -- Pharyngeal- Puree -- Pharyngeal -- Pharyngeal- Mechanical Soft -- Pharyngeal -- Pharyngeal- Regular -- Pharyngeal -- Pharyngeal- Multi-consistency WFL Pharyngeal -- Pharyngeal- Pill -- Pharyngeal -- Pharyngeal Comment --  CHL IP CERVICAL ESOPHAGEAL PHASE 05/31/2019 Cervical Esophageal Phase WFL Pudding Teaspoon -- Pudding Cup -- Honey Teaspoon -- Honey Cup -- Nectar Teaspoon -- Nectar Cup -- Nectar Straw -- Thin Teaspoon -- Thin Cup -- Thin Straw -- Puree -- Mechanical Soft -- Regular -- Multi-consistency -- Pill -- Cervical Esophageal Comment -- Happi Overton 05/31/2019, 3:32 PM              Recent Labs    06/01/19 1210  WBC 14.5*  HGB 16.2  HCT 49.8  PLT 435*   No results for input(s): NA, K, CL, CO2, GLUCOSE, BUN, CREATININE, CALCIUM in the last 72 hours.  Intake/Output Summary (Last 24 hours) at 06/02/2019 0738 Last data filed at 06/01/2019 1800 Gross per 24 hour  Intake 320 ml  Output 400 ml  Net -80 ml     Physical Exam: Vital Signs Blood pressure 131/66, pulse (!) 59, temperature 97.9 F (36.6 C), temperature source Oral, resp. rate 20, height 5\' 10"  (1.778 m), weight 75.8 kg, SpO2 94 %. Constitutional: No distress . Vital signs reviewed. HENT: Normocephalic.  Atraumatic. Eyes: EOMI.  No discharge. Cardiovascular: No JVD. Respiratory: Normal effort. GI: Non-distended. Musc: No edema or tenderness in extremities. Skin: Clean and intact without signs of breakdown Neuro:  Alert and oriented Motor: RUE/RLE: 5/5 proximal to distal Dense  left homonymous hemianopsia. LUE/LLE: 4+/5 proximal to distal, stable Psych: Slow to process but answers appropriately with normal affect  Assessment/Plan: 1. Functional deficits secondary to right MCA infarct which require 3+ hours per day of interdisciplinary therapy in a comprehensive inpatient rehab  setting.  Physiatrist is providing close team supervision and 24 hour management of active medical problems listed below.  Physiatrist and rehab team continue to assess barriers to discharge/monitor patient progress toward functional and medical goals  Care Tool:  Bathing    Body parts bathed by patient: Right arm, Left arm, Chest, Abdomen, Front perineal area, Buttocks, Right upper leg, Left upper leg, Face   Body parts bathed by helper: Right lower leg, Left lower leg     Bathing assist Assist Level: Minimal Assistance - Patient > 75%     Upper Body Dressing/Undressing Upper body dressing   What is the patient wearing?: Pull over shirt    Upper body assist Assist Level: Minimal Assistance - Patient > 75%    Lower Body Dressing/Undressing Lower body dressing      What is the patient wearing?: Pants, Incontinence brief     Lower body assist Assist for lower body dressing: Moderate Assistance - Patient 50 - 74%     Toileting Toileting    Toileting assist Assist for toileting: Moderate Assistance - Patient 50 - 74%     Transfers Chair/bed transfer  Transfers assist     Chair/bed transfer assist level: Moderate Assistance - Patient 50 - 74%     Locomotion Ambulation   Ambulation assist      Assist level: Contact Guard/Touching assist Assistive device: Walker-rolling Max distance: 100   Walk 10 feet activity   Assist  Walk 10 feet activity did not occur: Safety/medical concerns  Assist level: Contact Guard/Touching assist     Walk 50 feet activity   Assist Walk 50 feet with 2 turns activity did not occur: Safety/medical concerns  Assist level: Contact Guard/Touching assist      Walk 150 feet activity   Assist Walk 150 feet activity did not occur: Safety/medical concerns         Walk 10 feet on uneven surface  activity   Assist Walk 10 feet on uneven surfaces activity did not occur: Safety/medical concerns          Wheelchair     Assist Will patient use wheelchair at discharge?: No   Wheelchair activity did not occur: N/A         Wheelchair 50 feet with 2 turns activity    Assist    Wheelchair 50 feet with 2 turns activity did not occur: N/A       Wheelchair 150 feet activity     Assist Wheelchair 150 feet activity did not occur: N/A        Medical Problem List and Plan: 1.Dysarthria, right gaze deviation with decreased functional mobilitysecondary to right MCA infarction CIR PT, OT, SLP 2. Antithrombotics: -DVT/anticoagulation:Eliquis -antiplatelet therapy: N/A 3. Pain Management:Tylenol as needed 4. Mood:Provide emotional support -antipsychotic agents: N/A 5. Neuropsych: This patientiscapable of making decisions on hisown behalf. 6. Skin/Wound Care:Routine skin checks 7. Fluids/Electrolytes/Nutrition:  BMP within acceptable range on 6/29  Encourage fluids 8. Dysphagia.   D2 thin- upgrade per SLP  Continue to advance as tolerated 9. Complete heart block. 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  10. New onset atrial fibrillation.  Continue Eliquis as well as initiation of Toprol-XL 25 mg daily Vitals:   06/01/19 1924 06/02/19 0310  BP:  107/67 131/66  Pulse: 61 (!) 59  Resp: 20 20  Temp: 97.8 F (36.6 C) 97.9 F (36.6 C)  SpO2: 99% 94%  Controlled 7/2 11. Hyperlipidemia. Crestor 12. BPH. Proscar 5 mg daily 13. Hypertension. Patient on lisinopril 20 mg daily prior to admission      Controlled on 7/2 14. GERD. Protonix 15. Leukocytosis:  WBCs 12.1 on 6/29, now 14K  Afebrile  CXR - , will check UA  16.CKD. Baseline creatinine 1.28-1.35.   Cr. 1.04 on 6/29  Cont to monitor    LOS: 6 days A FACE TO FACE EVALUATION WAS PERFORMED  Charlett Blake 06/02/2019, 7:38 AM

## 2019-06-02 NOTE — Progress Notes (Signed)
Physical Therapy Session Note  Patient Details  Name: Jose Castro MRN: [id number] Date of Birth: 08/31/1926  Today's Date: 06/02/2019 PT Individual Time: 0915-1000 PT Individual Time Calculation (min): 45 min   Short Term Goals: Week 1:  PT Short Term Goal 1 (Week 1): Pt will ambulate 25' w/ CGA w/ LRAD PT Short Term Goal 2 (Week 1): Pt will maintain dynamic standing balance w/ min assist PT Short Term Goal 3 (Week 1): Pt will attend to L environment w/ min cues 50% of the time during functional mobility PT Short Term Goal 4 (Week 1): Pt will perform bed<>chair transfers w/ CGA     Skilled Therapeutic Interventions/Progress Updates:   Pt sitting up in w/c.  He complained of R great toe pain.  1st MCP joint is swollen and tender, erythematous.  He also stated that his L heel is sore; no redness, but tender.   PT informed Erline Levine, Therapist, sports.  Stand pivot w/c> mat to R  With mod assist, due to LOB backwards as he came up to standing.  Therapeutic activity to promote trunk shortening/lengthening/rotating, sitting with feet supported> unsupported, while reaching out of BOS with R/L hands for small objects.  Limited L trunk elongation noted, and L visual attention requiring multimodal cues.  neuromuscular re-education via demo and postioning, for sustained stretch R/L hamstrings in sitting, using foot stool; sustained stretch further, in standing with forefeet on wedge, bil UE support on back of armchair in front of him.  Pt placed 1 UE across chest and maintained balance; with bil hands across chest, he had immediate LOB backwards.  Pt's balance strategies absent bil for hips and ankles.  Gait training without AD on level tile, x 50' with multiple turns, mod > mod/max during turns.  Pt with bil UE support on PTs upper arms.  Pt's L knee remains flexed and trunk flexed throughout gait.  He stated that it does not hurt now, but he recently had injections in it for OA.    At end of session, pt resting in  w/c with seat belt alarm set and needs at hand.     Therapy Documentation Precautions:  Precautions Precautions: None Precaution Comments: swallowing Restrictions Weight Bearing Restrictions: No  Pain: Pain Assessment R great toe 8/10.           Therapy/Group: Individual Therapy  Jose Castro 06/02/2019, 11:18 AM

## 2019-06-02 NOTE — Progress Notes (Signed)
Occupational Therapy Weekly Progress Note  Patient Details  Name: Jose Castro MRN: [id number] Date of Birth: 06/02/1926  Beginning of progress report period: May 28, 2019 End of progress report period: June 02, 2019  Today's Date: 06/02/2019 OT Individual Time: 1020-1120 OT Individual Time Calculation (min): 60 min    Patient has met 2 of 4 short term goals.  Pt is making steady progress towards goals.  Pt currently requires mod cues to attend to Lt visual field during basic functional tasks as well as during mobility.  Pt continues to run RW in to Lt door frame when exiting bathroom and requires cues to correct due to decreased attention and awareness.  Pt overall min assist with transfers and bathing/dressing.  Pt requires cues for problem solving with UB dressing as pt frequently dons shirt backwards, again with decreased awareness of how to correct.  Pt has demonstrated some lethargy with difficulty to arouse, but when awake participates quite well in therapy.  Patient continues to demonstrate the following deficits:  muscle weakness, decreased cardiorespiratoy endurance, impaired timing and sequencing, abnormal tone, unbalanced muscle activation and decreased coordination, decreased attention, decreased awareness, decreased problem solving, decreased safety awareness and decreased memory and decreased sitting balance, decreased standing balance, hemiplegia and decreased balance strategies and therefore will continue to benefit from skilled OT intervention to enhance overall performance with BADL and Reduce care partner burden.  Patient progressing toward long term goals..  Continue plan of care.  OT Short Term Goals Week 1:  OT Short Term Goal 1 (Week 1): Pt will perform 3/3 grooming tasks with mininmal cuing with supervision OT Short Term Goal 1 - Progress (Week 1): Progressing toward goal OT Short Term Goal 2 (Week 1): Pt will perform UB dressing with supervision OT Short Term Goal 2 -  Progress (Week 1): Met OT Short Term Goal 3 (Week 1): Pt will perform LB dressing wiht min A OT Short Term Goal 3 - Progress (Week 1): Met OT Short Term Goal 4 (Week 1): Pt will scan to the left field with minimal cuing to obtain self care items OT Short Term Goal 4 - Progress (Week 1): Progressing toward goal Week 2:  OT Short Term Goal 1 (Week 2): Pt will perform 3/3 grooming tasks with mininmal cuing and supervision for standing balance OT Short Term Goal 2 (Week 2): Pt will scan to the left field with minimal cuing to obtain self care items OT Short Term Goal 3 (Week 2): Pt will complete toilet transfers with supervision  Skilled Therapeutic Interventions/Progress Updates:    Treatment session with focus on ADL retraining with bathing at shower level.  Pt received on toilet with RN present providing supervision.  Pt completed hygiene post toileting and doffed pants seated on toilet in preparation for shower.  Min cues for sequencing of transfer on to shower chair.  Pt completed bathing with overall min assist, requiring min-mod cues for sequencing and thoroughness with bathing.  Completed dressing with mod cues for orientation of shirt after pt attempted to don shirt backwards with no awareness and unable to problem solve how to correct.  Pt able to don pants with increased time and CGA for standing balance when pulling pants over hips.  Pt remained upright in w/c with seat belt alarm on and all needs in reach.  Therapy Documentation Precautions:  Precautions Precautions: None Precaution Comments: swallowing Restrictions Weight Bearing Restrictions: No Pain: Pt with no c/o pain  Therapy/Group: Individual Therapy  [name]  06/02/2019, 7:31 AM

## 2019-06-02 NOTE — Patient Care Conference (Signed)
Inpatient RehabilitationTeam Conference and Plan of Care Update Date: 06/01/2019   Time: 10:40 AM    Patient Name: Jose Castro      Medical Record Number: [id number]  Date of Birth: 05/29/1926 Sex: Male         Room/Bed: 4W01C/4W01C-01 Payor Info: Payor: [employer] MEDICARE / Plan: [facility] MEDICARE / Product Type: *No Product type* /    Admitting Diagnosis: 3. CVA 1 Team R CVA, 17-19 days  Admit Date/Time:  05/27/2019  5:08 PM Admission Comments: No comment available   Primary Diagnosis:  <principal problem not specified> Principal Problem: <principal problem not specified>  Patient Active Problem List   Diagnosis Date Noted  . CKD (chronic kidney disease), stage II   . Leukocytosis   . Benign essential HTN   . New onset atrial fibrillation ([place])   . Dysphagia, post-stroke   . 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 Acute ischemic right MCA stroke ([place]) 05/25/2019  . Goals of care, counseling/discussion   . Palliative care by specialist   . DNR (do not resuscitate) discussion   . Stroke, acute, embolic ([place]) 52/84/1324  . Persistent proteinuria 07/30/2018  . 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 Chronic kidney disease, stage III (moderate) ([place]) 06/15/2018  . Leg pain 05/06/2018  . Lymphedema 05/06/2018  . Coagulopathy ([place]) 07/07/2017  . Lumbar spondylosis 03/10/2017  . Elevated uric acid in blood 11/20/2016  . BPH (benign prostatic hyperplasia) 05/29/2016  . Peripheral vascular disease of lower extremity ([place]) 03/03/2016  . Gallstone 12/31/2015  . 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 Atherosclerosis of aorta ([place]) 11/22/2015  . Carotid artery narrowing 11/22/2015  . Diverticulosis of colon 11/22/2015  . Decreased creatinine clearance 11/22/2015  . Arthritis, degenerative 11/22/2015  . Lactose intolerance 11/22/2015  . Basal cell carcinoma 11/22/2015  . Essential hypertension 06/21/2015  .  Hyperlipemia 06/21/2015  . GERD (gastroesophageal reflux disease) 06/21/2015  . Calculus of kidney 11/18/2013    Expected Discharge Date: Expected Discharge Date: 06/08/19  Team Members Present: Physician leading conference: Dr. Alysia Penna Social Worker Present: Alfonse Alpers, LCSW Nurse Present: Rayetta Pigg, RN PT Present: Barrie Folk, PT OT Present: Darleen Crocker, OT SLP Present: Stormy Fabian, SLP PPS Coordinator present : Gunnar Fusi, SLP     Current Status/Progress Goal Weekly Team Focus  Medical   Patient is incontinent of bladder  Reduce fall risk improve continence  Toileting program   Bowel/Bladder   Inc/Cont Bowel & Bladder. LBM 05/31/2019  q2 timed toileting,  assess toileting needs and offer urinal   Swallow/Nutrition/ Hydration   dysphagia 2 with honey thick liquids  Supervision  Completion of MBS, upgraded to thin liquids   ADL's   Min assist transfers, Min assist bathing and UB dressing, Mod assist LB dressing.  Pt with Lt inattention requiring mod-max cues to attend to Lt and compensate  Supervision overall, min cues for Lt attention and cognition  ADL retraining, Lt attention, LUE coordination, activity tolerance, awareness of impairments   Mobility   min A with gait and transfers  supervision overall  balance, awareness, gait   Communication   Mild dysarthria  supervision  use of speech intelligibility strategies   Safety/Cognition/ Behavioral Observations  Min A to supervision for basic problem solving  Supervision  compensatory memory strategies, problem solving   Pain   c/o pain 6/10 in Right great toe, Tylenol given as ordered  Free of pain  Assess  pain q shift and prn, medicate as needed   Skin   scabbed abrasion open to air L elbow, ecchymosis bilateral arms  skin remain intact free of infection and further damage  assess skin q shift and prn    Rehab Goals Patient on target to meet rehab goals: Yes Rehab Goals Revised: none *See  Care Plan and progress notes for long and short-term goals.     Barriers to Discharge  Current Status/Progress Possible Resolutions Date Resolved   Physician    Neurogenic Bowel & Bladder;Medical stability     Progressing towards goals physically  Continue rehabilitation      Nursing                  PT                    OT                  SLP                SW                Discharge Planning/Teaching Needs:  Pt to return to his home with family members taking turns to come stay with pt.  Pt's dtr to come on 06-06-19 to see pt's progress and expected care needed at home.   Team Discussion:  Pt is 85 y/o and came in with a CVA. He's been doing quite well, but now with lethargy.  Pt having work up to make sure he has nothing new going on.  Pt seems to be sleeping well and only complains of right great toe pain.  Pt was min A with UB ADLs and mod A for LB.  Pt has S level OT goals.  Pt is min A for bed mobility and min A squat pivot txs with rolling walker.  Pt can walk 100' with supervision and rolling walker.  Pt can do stairs min A.  Pt's diet has been changed to D2 diet and thin liquids.  Pt has supervision level goals overall.  Revisions to Treatment Plan:  none    Continued Need for Acute Rehabilitation Level of Care: The patient requires daily medical management by a physician with specialized training in physical medicine and rehabilitation for the following conditions: Daily direction of a multidisciplinary physical rehabilitation program to ensure safe treatment while eliciting the highest outcome that is of practical value to the patient.: Yes Daily medical management of patient stability for increased activity during participation in an intensive rehabilitation regime.: Yes Daily analysis of laboratory values and/or radiology reports with any subsequent need for medication adjustment of medical intervention for : Neurological problems   I attest that I was present, lead  the team conference, and concur with the assessment and plan of the team..Team conference was held via web/ teleconference due to [place] - 19.   Gerren Hoffmeier, Silvestre Mesi 06/02/2019, 12:59 AM

## 2019-06-02 NOTE — Progress Notes (Signed)
  Patient ID: Jose Castro, male   DOB: [birth year], 85 y.o.   MRN: [id number]      Diagnosis codes:    I63.411; R53.1; G81.94  Height:    5'10"            Weight:     167       Patient suffers from right middle cerebral artery stroke which impairs his ability to perform daily activities like toileting, grooming, feeding, bathing in the home.  A walker or cane will not resolve issue with performing activities of daily living.  A wheelchair will allow patient to safely perform daily activities.  Patient is not able to propel himself in the home using a standard weight wheelchair due to arm weakness and endurance.  Patient can self propel in the lightweight wheelchair.

## 2019-06-02 NOTE — Progress Notes (Signed)
Social Work Patient ID: Jose Castro, male   DOB: 03-14-1926, 85 y.o.   MRN: [id number]   CSW met with pt and talked with his granddtr and dtr [name]) via telephone to update them on team conference discussion and targeted d/c date of 06-08-19.  Pt and family were pleased with this and they will get ready for pt at home.  CSW discussed DME and HH and will assist family with this.  Pt was eager to learn about tests run today and CSW told pt's medical team that he wanted an update.  No other needs identified.  CSW will set up family education for pt's dtr 06-06-19.  CSW will continue to follow and assist as needed.

## 2019-06-02 NOTE — Progress Notes (Signed)
Patient complains of Right great toe pain at 1st MCP joint, which is red, swollen, and tender. MD notified and awaiting orders.

## 2019-06-03 ENCOUNTER — Inpatient Hospital Stay (HOSPITAL_COMMUNITY): Payer: Medicare Other | Admitting: Physical Therapy

## 2019-06-03 ENCOUNTER — Inpatient Hospital Stay (HOSPITAL_COMMUNITY): Payer: Medicare Other | Admitting: Speech Pathology

## 2019-06-03 ENCOUNTER — Inpatient Hospital Stay (HOSPITAL_COMMUNITY): Payer: Medicare Other

## 2019-06-03 LAB — URIC ACID: Uric Acid, Serum: 7.1 mg/dL (ref 3.7–8.6)

## 2019-06-03 NOTE — Progress Notes (Signed)
Occupational Therapy Session Note  Patient Details  Name: Jose Castro MRN: [id number] Date of Birth: [birth year]  Today's Date: 06/03/2019 OT Individual Time: 1300-1345 OT Individual Time Calculation (min): 45 min    Short Term Goals: Week 2:  OT Short Term Goal 1 (Week 2): Pt will perform 3/3 grooming tasks with mininmal cuing and supervision for standing balance OT Short Term Goal 2 (Week 2): Pt will scan to the left field with minimal cuing to obtain self care items OT Short Term Goal 3 (Week 2): Pt will complete toilet transfers with supervision  Skilled Therapeutic Interventions/Progress Updates:    Pt resting in w/c upon arrival and agreeable to therapy.  OT intervention with focus on attention to L, LUE functional use, sit<>stand, standing balance, sequencing, and activity tolerance.  Pt initially engaged in colored peg board tasks (moderate difficulty) with all items placed on his L.  Pt required min verbal cues to reinitiate task.  Pt completed task correctly with additional time using his LUE alternating with RUE to complete task.  Pt also completed box & Block test (RUE-18 and LUE-17). Pt with deliberate actions.  Pt also engaged in standing task with RW to retrieve horse shoes with LUE and toss with RUE while stepping forward with RLE to toss.  Pt returned to room and remained in w/c with belt alarm activated and all needs within reach.   Therapy Documentation Precautions:  Precautions Precautions: None Precaution Comments: swallowing Restrictions Weight Bearing Restrictions: No   Pain: Pain Assessment Pain Scale: 0-10 Pain Score: 0-No pain  Therapy/Group: Individual Therapy  Leroy Libman 06/03/2019, 1:46 PM

## 2019-06-03 NOTE — Progress Notes (Addendum)
Physical Therapy Session Note  Patient Details  Name: Jose Castro MRN: [id number] Date of Birth: 25-Jan-1926  Today's Date: 06/03/2019 PT Individual Time: 0830-0925 PT Individual Time Calculation (min): 55 min   Short Term Goals: Week 2:  PT Short Term Goal 1 (Week 2): = LTG  Skilled Therapeutic Interventions/Progress Updates:  Pt rec'd in chair, requesting to toilet. Pt performs sit <> stand CGA with good memory of UE placement. Gait to toilet with min A with RW.  toileting performed with supervision, total A for clothing management.  Pt performs standing balance for handwashing with close supervision.  Stair negotiation training with pt able to perform 8 stairs with 1 handrail with CGA today. Much improved balance and LE strength.  Standing balance training without UE support for tap ups, mini squats, side and backwards gait with mod A for balance due to posterior lean. Pt able to improve balance with manual facilitation but returns to posterior lean when fatigued.  Pt left in w/c with needs at hand, alarm set.  Session 2: no c/o pain.  Session focused on NMR on kinetron for strength and balance. Pt performs standing 3 x 10 reps on kinetron with min A for balance and upright posture.  Sit <> stand 4 x 5 with focus on balance and equal wt bearing while on kinetron with mod manual facilitation.  Pt left in w/c with alarm set, needs at hand.  Therapy Documentation Precautions:  Precautions Precautions: None Precaution Comments: swallowing Restrictions Weight Bearing Restrictions: No Pain:  no c/o pain   Therapy/Group: Individual Therapy  Marieanne Marxen 06/03/2019, 9:28 AM

## 2019-06-03 NOTE — Progress Notes (Signed)
Speech Language Pathology Weekly Progress and Session Note  Patient Details  Name: Jose Castro MRN: [id number] Date of Birth: [birth year]  Beginning of progress report period: May 28, 2019 End of progress report period: June 03, 2019  Today's Date: 06/03/2019 SLP Individual Time: 1115-1200 SLP Individual Time Calculation (min): 45 min  Short Term Goals: Week 1: SLP Short Term Goal 1 (Week 1): Patient will utilize external memory aids to recall new daily information with min A verbal cues. SLP Short Term Goal 1 - Progress (Week 1): Progressing toward goal SLP Short Term Goal 2 (Week 1): Patient will consume dysphagia 3 textures and thin liquids without overt s/sx of aspiration to demonstrate readiness for upgrade. SLP Short Term Goal 2 - Progress (Week 1): Progressing toward goal SLP Short Term Goal 3 (Week 1): Patient will uitlize speech intelligilbity strategies at the conversation level with min verbal cues to achieve 80% intelligibility. SLP Short Term Goal 3 - Progress (Week 1): Met SLP Short Term Goal 4 (Week 1): Patient will demonstrate functional problem solving for basic and familiar tasks with min A verbal cues. SLP Short Term Goal 4 - Progress (Week 1): Progressing toward goal SLP Short Term Goal 5 (Week 1): Pt will self-monitor and self-correct errors within functional tasks with  Mod A cues. SLP Short Term Goal 5 - Progress (Week 1): Progressing toward goal SLP Short Term Goal 6 (Week 1): Given Min A, pt will scan to left of his environment to locate items within problem solving tasks. SLP Short Term Goal 6 - Progress (Week 1): Progressing toward goal    New Short Term Goals: Week 2: SLP Short Term Goal 1 (Week 2): Patient will utilize external memory aids to recall new daily information with min A verbal cues. SLP Short Term Goal 2 (Week 2): Patient will consume dysphagia 3 textures and thin liquids without overt s/sx of aspiration to demonstrate readiness for upgrade. SLP  Short Term Goal 3 (Week 2): Patient will demonstrate functional problem solving for basic and familiar tasks with min A verbal cues. SLP Short Term Goal 4 (Week 2): Given Min A, pt will scan to left of his environment to locate items within problem solving tasks. SLP Short Term Goal 5 (Week 2): Pt will self-monitor and self-correct errors within functional tasks with  Mod A cues.  Weekly Progress Updates: Patient presents with improvements in swallow function. Upon admission patient was on a dysphagia 2 diet and honey thick liquids. Based off of MBS results from 05/31/19 patient has advanced to a dysphagia 2 diet and thin liquids. Patient met 1 short term goal targeting speech intelligibility. Patient is now 100% intelligible in conversational speech. Patient is progressing towards all other therapy goals.    Intensity: Minumum of 1-2 x/day, 30 to 90 minutes Frequency: 3 to 5 out of 7 days Duration/Length of Stay: 06/08/19 Treatment/Interventions: Cognitive remediation/compensation;Dysphagia/aspiration precaution training;Patient/family education;Therapeutic Exercise   Daily Session  Skilled Therapeutic Interventions: Patient received skilled SLP services targeting cognitive goals. Patient participated in a medication management task filling a weekly pill box with 50% accuracy requiring max verbal cues. Patient was unaware of medication errors despite education regarding errors with pill box. SLP facilitated use of note taking as a external memory strategy with patient creating a grocery list with 10 items he needs at the store. At the end of therapy session patient was upright in chair, chair alarm activated, and all needs within reach.     Pain Pain Assessment Pain Scale:  0-10 Pain Score: 0-No pain  Therapy/Group: Individual Therapy  Cristy Folks 06/03/2019, 12:46 PM

## 2019-06-03 NOTE — Progress Notes (Signed)
[place] PHYSICAL MEDICINE & REHABILITATION PROGRESS NOTE   Subjective/Complaints:  Right great toe pain Denies trauma to the area X-rays reviewed, loss of joint space right first MTP no fractures visualized.  Official report pending  ROS: Denies CP, SOB, N/V/D  Objective:   Dg Chest 2 View  Result Date: 06/01/2019 CLINICAL DATA:  Lethargic today. EXAM: CHEST - 2 VIEW COMPARISON:  05/24/2019 FINDINGS: Dual lead pacemaker appears the same. Heart size is normal. Chronic thoracic aortic atherosclerosis and tortuosity. The lungs are clear. The vascularity is normal. No effusions. IMPRESSION: No active cardiopulmonary disease. Electronically Signed   By: Nelson Chimes M.D.   On: 06/01/2019 10:30   Recent Labs    06/01/19 1210  WBC 14.5*  HGB 16.2  HCT 49.8  PLT 435*   No results for input(s): NA, K, CL, CO2, GLUCOSE, BUN, CREATININE, CALCIUM in the last 72 hours.  Intake/Output Summary (Last 24 hours) at 06/03/2019 0726 Last data filed at 06/02/2019 2350 Gross per 24 hour  Intake 1060 ml  Output 100 ml  Net 960 ml     Physical Exam: Vital Signs Blood pressure 124/72, pulse 61, temperature 98 F (36.7 C), temperature source Oral, resp. rate 16, height 5\' 10"  (1.778 m), weight 75.8 kg, SpO2 99 %. Constitutional: No distress . Vital signs reviewed. HENT: Normocephalic.  Atraumatic. Eyes: EOMI.  No discharge. Cardiovascular: No JVD. Respiratory: Normal effort. GI: Non-distended. Musc: No edema or tenderness in extremities. Skin: Clean and intact without signs of breakdown, mild erythema over the first MTP Neuro:  Alert and oriented Motor: RUE/RLE: 5/5 proximal to distal Dense left homonymous hemianopsia. LUE/LLE: 4+/5 proximal to distal, stable Psych: Slow to process but answers appropriately with normal affect  Assessment/Plan: 1. Functional deficits secondary to right MCA infarct which require 3+ hours per day of interdisciplinary therapy in a comprehensive inpatient rehab  setting.  Physiatrist is providing close team supervision and 24 hour management of active medical problems listed below.  Physiatrist and rehab team continue to assess barriers to discharge/monitor patient progress toward functional and medical goals  Care Tool:  Bathing    Body parts bathed by patient: Right arm, Left arm, Chest, Abdomen, Front perineal area, Buttocks, Right upper leg, Left upper leg, Face   Body parts bathed by helper: Right lower leg, Left lower leg     Bathing assist Assist Level: Minimal Assistance - Patient > 75%     Upper Body Dressing/Undressing Upper body dressing   What is the patient wearing?: Pull over shirt    Upper body assist Assist Level: Supervision/Verbal cueing    Lower Body Dressing/Undressing Lower body dressing      What is the patient wearing?: Pants, Incontinence brief     Lower body assist Assist for lower body dressing: Minimal Assistance - Patient > 75%     Toileting Toileting    Toileting assist Assist for toileting: Contact Guard/Touching assist     Transfers Chair/bed transfer  Transfers assist     Chair/bed transfer assist level: Moderate Assistance - Patient 50 - 74%     Locomotion Ambulation   Ambulation assist      Assist level: Maximal Assistance - Patient 25 - 49% Assistive device: No Device Max distance: 50   Walk 10 feet activity   Assist  Walk 10 feet activity did not occur: Safety/medical concerns  Assist level: Maximal Assistance - Patient 25 - 49% Assistive device: No Device   Walk 50 feet activity   Assist Walk  50 feet with 2 turns activity did not occur: Safety/medical concerns  Assist level: Maximal Assistance - Patient 25 - 49% Assistive device: No Device    Walk 150 feet activity   Assist Walk 150 feet activity did not occur: Safety/medical concerns         Walk 10 feet on uneven surface  activity   Assist Walk 10 feet on uneven surfaces activity did not occur:  Safety/medical concerns         Wheelchair     Assist Will patient use wheelchair at discharge?: No   Wheelchair activity did not occur: N/A         Wheelchair 50 feet with 2 turns activity    Assist    Wheelchair 50 feet with 2 turns activity did not occur: N/A       Wheelchair 150 feet activity     Assist Wheelchair 150 feet activity did not occur: N/A        Medical Problem List and Plan: 1.Dysarthria, right gaze deviation with decreased functional mobilitysecondary to right MCA infarction CIR PT, OT, SLP 2. Antithrombotics: -DVT/anticoagulation:Eliquis -antiplatelet therapy: N/A 3. Pain Management:Tylenol as needed 4. Mood:Provide emotional support -antipsychotic agents: N/A 5. Neuropsych: This patientiscapable of making decisions on hisown behalf. 6. Skin/Wound Care:Routine skin checks 7. Fluids/Electrolytes/Nutrition:  BMP within acceptable range on 6/29  Encourage fluids 8. Dysphagia.   D2 thin- upgrade per SLP  Continue to advance as tolerated 9. Complete heart block. 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  10. New onset atrial fibrillation.  Continue Eliquis as well as initiation of Toprol-XL 25 mg daily Vitals:   06/03/19 0331 06/03/19 0616  BP: (!) 115/54 124/72  Pulse: 62 61  Resp: 20 16  Temp: 98.3 F (36.8 C) 98 F (36.7 C)  SpO2: 92% 99%  Controlled 7/3 11. Hyperlipidemia. Crestor 12. BPH. Proscar 5 mg daily 13. Hypertension. Patient on lisinopril 20 mg daily prior to admission      Controlled on 7/2 14. GERD. Protonix 15. Leukocytosis:  WBCs 12.1 on 6/29, now 14K  Afebrile  CXR - , will check UA  16.CKD. Baseline creatinine 1.28-1.35.   Cr. 1.04 on 6/29  Cont to monitor 17.  Right great toe pain has evidence of osteoarthritis will check serum uric acid as well consider Medrol dose pack  LOS: 7 days A FACE TO FACE EVALUATION WAS PERFORMED  Charlett Blake 06/03/2019,  7:26 AM

## 2019-06-03 NOTE — Progress Notes (Signed)
Physical Therapy Weekly Progress Note  Patient Details  Name: Jose Castro MRN: [id number] Date of Birth: 10/10/1926  Beginning of progress report period: May 28, 2019 End of progress report period: June 03, 2019   Patient has met 2 of 4 short term goals.  Pt still with deficits in LT attention and standing balance, improving gait with use of RW.  Patient continues to demonstrate the following deficits muscle weakness, decreased coordination and decreased motor planning, decreased awareness, decreased safety awareness, decreased memory and delayed processing and decreased standing balance, decreased postural control and decreased balance strategies and therefore will continue to benefit from skilled PT intervention to increase functional independence with mobility.  Patient progressing toward long term goals..  Continue plan of care.  PT Short Term Goals Week 1:  PT Short Term Goal 1 (Week 1): Pt will ambulate 25' w/ CGA w/ LRAD PT Short Term Goal 1 - Progress (Week 1): Met PT Short Term Goal 2 (Week 1): Pt will maintain dynamic standing balance w/ min assist PT Short Term Goal 2 - Progress (Week 1): Progressing toward goal PT Short Term Goal 3 (Week 1): Pt will attend to L environment w/ min cues 50% of the time during functional mobility PT Short Term Goal 3 - Progress (Week 1): Progressing toward goal PT Short Term Goal 4 (Week 1): Pt will perform bed<>chair transfers w/ CGA PT Short Term Goal 4 - Progress (Week 1): Met Week 2:  PT Short Term Goal 1 (Week 2): = LTG  Skilled Therapeutic Interventions/Progress Updates:  Ambulation/gait training;Community reintegration;DME/adaptive equipment instruction;Neuromuscular re-education;Stair training;Psychosocial support;UE/LE Strength taining/ROM;UE/LE Coordination activities;Therapeutic Activities;Skin care/wound management;Pain management;Discharge planning;Balance/vestibular training;Cognitive remediation/compensation;Disease  management/prevention;Functional mobility training;Patient/family education;Splinting/orthotics;Therapeutic Exercise;Visual/perceptual remediation/compensation;Wheelchair propulsion/positioning     [name] 06/03/2019, 7:18 AM

## 2019-06-04 DIAGNOSIS — R5383 Other fatigue: Secondary | ICD-10-CM

## 2019-06-04 DIAGNOSIS — M79674 Pain in right toe(s): Secondary | ICD-10-CM

## 2019-06-04 MED ORDER — DICLOFENAC SODIUM 1 % TD GEL
2.0000 g | Freq: Four times a day (QID) | TRANSDERMAL | Status: DC
  Administered 2019-06-04 – 2019-06-08 (×16): 2 g via TOPICAL
  Filled 2019-06-04: qty 100

## 2019-06-04 NOTE — Progress Notes (Signed)
[place] PHYSICAL MEDICINE & REHABILITATION PROGRESS NOTE   Subjective/Complaints: Patient seen laying in bed this morning.  He states he slept well overnight.  He is still sleepy this morning.  ROS: Denies CP, SOB, N/V/D  Objective:   Dg Foot 2 Views Right  Result Date: 06/03/2019 CLINICAL DATA:  Great toe pain. EXAM: RIGHT FOOT - 2 VIEW COMPARISON:  None. FINDINGS: No acute fracture or dislocation. Severe first MTP joint space narrowing with marginal osteophytes. Remaining joint spaces are preserved. Bone mineralization is normal. Soft tissues are unremarkable. IMPRESSION: 1. Severe first MTP joint osteoarthritis. Electronically Signed   By: Titus Dubin M.D.   On: 06/03/2019 12:07   Recent Labs    06/01/19 1210  WBC 14.5*  HGB 16.2  HCT 49.8  PLT 435*   No results for input(s): NA, K, CL, CO2, GLUCOSE, BUN, CREATININE, CALCIUM in the last 72 hours.  Intake/Output Summary (Last 24 hours) at 06/04/2019 1044 Last data filed at 06/04/2019 0810 Gross per 24 hour  Intake 438 ml  Output -  Net 438 ml     Physical Exam: Vital Signs Blood pressure 120/69, pulse 62, temperature 97.6 F (36.4 C), temperature source Oral, resp. rate 16, height 5\' 10"  (1.778 m), weight 75.8 kg, SpO2 93 %. Constitutional: No distress . Vital signs reviewed. HENT: Normocephalic.  Atraumatic. Eyes: No discharge. Cardiovascular: No JVD. Respiratory: Normal effort. GI: Non-distended. Musc: No edema or tenderness in extremities. Skin:  Warm and dry.  Intact. Neuro:  Alert Motor: RUE/RLE: 5/5 proximal to distal LUE/LLE: 4+/5 proximal to distal, stable Psych: Slow to process   Assessment/Plan: 1. Functional deficits secondary to right MCA infarct which require 3+ hours per day of interdisciplinary therapy in a comprehensive inpatient rehab setting.  Physiatrist is providing close team supervision and 24 hour management of active medical problems listed below.  Physiatrist and rehab team continue  to assess barriers to discharge/monitor patient progress toward functional and medical goals  Care Tool:  Bathing    Body parts bathed by patient: Right arm, Left arm, Chest, Abdomen, Front perineal area, Buttocks, Right upper leg, Left upper leg, Face   Body parts bathed by helper: Right lower leg, Left lower leg     Bathing assist Assist Level: Minimal Assistance - Patient > 75%     Upper Body Dressing/Undressing Upper body dressing   What is the patient wearing?: Pull over shirt    Upper body assist Assist Level: Supervision/Verbal cueing    Lower Body Dressing/Undressing Lower body dressing      What is the patient wearing?: Pants, Incontinence brief     Lower body assist Assist for lower body dressing: Minimal Assistance - Patient > 75%     Toileting Toileting    Toileting assist Assist for toileting: Contact Guard/Touching assist     Transfers Chair/bed transfer  Transfers assist     Chair/bed transfer assist level: Moderate Assistance - Patient 50 - 74%     Locomotion Ambulation   Ambulation assist      Assist level: Maximal Assistance - Patient 25 - 49% Assistive device: No Device Max distance: 50   Walk 10 feet activity   Assist  Walk 10 feet activity did not occur: Safety/medical concerns  Assist level: Maximal Assistance - Patient 25 - 49% Assistive device: No Device   Walk 50 feet activity   Assist Walk 50 feet with 2 turns activity did not occur: Safety/medical concerns  Assist level: Maximal Assistance - Patient 25 -  49% Assistive device: No Device    Walk 150 feet activity   Assist Walk 150 feet activity did not occur: Safety/medical concerns         Walk 10 feet on uneven surface  activity   Assist Walk 10 feet on uneven surfaces activity did not occur: Safety/medical concerns         Wheelchair     Assist Will patient use wheelchair at discharge?: No   Wheelchair activity did not occur: N/A          Wheelchair 50 feet with 2 turns activity    Assist    Wheelchair 50 feet with 2 turns activity did not occur: N/A       Wheelchair 150 feet activity     Assist Wheelchair 150 feet activity did not occur: N/A        Medical Problem List and Plan: 1.Dysarthria, right gaze deviation with decreased functional mobilitysecondary to right MCA infarction  Continue CIR 2. Antithrombotics: -DVT/anticoagulation:Eliquis -antiplatelet therapy: N/A 3. Pain Management:Tylenol as needed 4. Mood:Provide emotional support -antipsychotic agents: N/A 5. Neuropsych: This patientiscapable of making decisions on hisown behalf. 6. Skin/Wound Care:Routine skin checks 7. Fluids/Electrolytes/Nutrition:  BMP within acceptable range on 6/29  Encourage fluids 8. Dysphagia.   D2 thin-advance that as tolerated 9. Complete heart block. 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  10. New onset atrial fibrillation.  Continue Eliquis as well as initiation of Toprol-XL 25 mg daily Vitals:   06/03/19 2010 06/04/19 0405  BP: 137/80 120/69  Pulse:  62  Resp:  16  Temp:  97.6 F (36.4 C)  SpO2:  93%   Controlled on 7/4 11. Hyperlipidemia. Crestor 12. BPH. Proscar 5 mg daily 13. Hypertension. Patient on lisinopril 20 mg daily prior to admission      Controlled on 7/4 14. GERD. Protonix 15. Leukocytosis:  WBCs 14.5 on 7/1, labs ordered for Monday  Afebrile  CXR - , UA reviewed, unremarkable, urine culture with 50,000 16.CKD. Baseline creatinine 1.28-1.35.   Cr. 1.04 on 6/29, labs ordered for Monday  Cont to monitor 17.  Right great toe pain:  Uric acid within normal limits  X-ray reviewed, showing OA  Voltaren gel ordered  LOS: 8 days A FACE TO FACE EVALUATION WAS PERFORMED   Lorie Phenix 06/04/2019, 10:44 AM

## 2019-06-05 ENCOUNTER — Inpatient Hospital Stay (HOSPITAL_COMMUNITY): Payer: Medicare Other

## 2019-06-05 DIAGNOSIS — M19071 Primary osteoarthritis, right ankle and foot: Secondary | ICD-10-CM

## 2019-06-05 NOTE — Progress Notes (Addendum)
Physical Therapy Session Note  Patient Details  Name: Jose Castro MRN: [id number] Date of Birth: 21-Apr-1926  Today's Date: 06/05/2019 PT Individual Time: 0830-0930 PT Individual Time Calculation (min): 60 min   Short Term Goals: Week 1:  PT Short Term Goal 1 (Week 2): = LTG  Skilled Therapeutic Interventions/Progress Updates:    Patient in w/c with RN in the room.  Pt. Requesting shoes and socks instead of slipper socks.  Total A to don in sitting.  Propelled w/c to door of room with min A difficulty with L hand on wheel.  Pushed to rehab gym.  Sit<>stand x 8 with cue for balance and technique.  Toes on balance beam for balance and ankle stretch with head turns and minguard A.  Attempted ant/post rocking in RW, but c/o too much pain L great toe.  In parallel bars performed side stepping with UE support cues for less support.  Single step taps to skids on platform in bars with min to no UE support.  More difficulty with standing SLS on L.  Standing with feet apart and eyes closed with remembered target 2 x 10 sec with minguard to S.  Patient negotiated 12 steps with R rail halfway and bilateral rails halfway with min to minguard A leads up with R and down with L first.  Ambulated another 30' x 3 with RW and CGA to S.  Seated for Nu Step x 5 minutes level 3 bilat UE/LE.  Assisted in w/c back to room and left with call bell in reach and seat belt alarm active.   Therapy Documentation Precautions:  Precautions Precautions: None Precaution Comments: swallowing Restrictions Weight Bearing Restrictions: No Pain: Pain Assessment Pain Scale: 0-10 Pain Score: 7  Pain Type: Acute pain Pain Location: Toe (Comment which one) Pain Orientation: Right Pain Descriptors / Indicators: Constant;Discomfort Pain Onset: On-going Pain Intervention(s): Repositioned;Distraction;Ambulation/increased activity       Therapy/Group: Individual Therapy  Reginia Naas  Magda Kiel, PT 06/05/2019, 9:10 AM

## 2019-06-05 NOTE — Progress Notes (Signed)
[facility] PHYSICAL MEDICINE & REHABILITATION PROGRESS NOTE   Subjective/Complaints: Patient seen sitting up in his chair this morning.  He states he slept well overnight.  Discussed toe pain with patient, he reports improvement with Voltaren gel.  ROS: Denies CP, SOB, N/V/D  Objective:   No results found. No results for input(s): WBC, HGB, HCT, PLT in the last 72 hours. No results for input(s): NA, K, CL, CO2, GLUCOSE, BUN, CREATININE, CALCIUM in the last 72 hours.  Intake/Output Summary (Last 24 hours) at 06/05/2019 1014 Last data filed at 06/05/2019 0718 Gross per 24 hour  Intake 661 ml  Output -  Net 661 ml     Physical Exam: Vital Signs Blood pressure 130/89, pulse 72, temperature 98 F (36.7 C), temperature source Oral, resp. rate 18, height 5\' 10"  (1.778 m), weight 75.8 kg, SpO2 99 %. Constitutional: No distress . Vital signs reviewed. HENT: Normocephalic.  Atraumatic Eyes: No discharge. Cardiovascular: No JVD. Respiratory: Normal effort. GI: Non-distended. Musc: No edema or tenderness in extremities. Skin:  Warm and dry.  Intact. Neuro:  Alert Motor: RUE/RLE: 5/5 proximal to distal, unchanged LUE/LLE: 4+/5 proximal to distal, unchanged Psych: Slow to process   Assessment/Plan: 1. Functional deficits secondary to right MCA infarct which require 3+ hours per day of interdisciplinary therapy in a comprehensive inpatient rehab setting.  Physiatrist is providing close team supervision and 24 hour management of active medical problems listed below.  Physiatrist and rehab team continue to assess barriers to discharge/monitor patient progress toward functional and medical goals  Care Tool:  Bathing    Body parts bathed by patient: Right arm, Left arm, Chest, Abdomen, Front perineal area, Buttocks, Right upper leg, Left upper leg, Face   Body parts bathed by helper: Right lower leg, Left lower leg     Bathing assist Assist Level: Minimal Assistance - Patient > 75%      Upper Body Dressing/Undressing Upper body dressing   What is the patient wearing?: Pull over shirt    Upper body assist Assist Level: Supervision/Verbal cueing    Lower Body Dressing/Undressing Lower body dressing      What is the patient wearing?: Pants, Incontinence brief     Lower body assist Assist for lower body dressing: Minimal Assistance - Patient > 75%     Toileting Toileting    Toileting assist Assist for toileting: Contact Guard/Touching assist     Transfers Chair/bed transfer  Transfers assist     Chair/bed transfer assist level: Moderate Assistance - Patient 50 - 74%     Locomotion Ambulation   Ambulation assist      Assist level: Maximal Assistance - Patient 25 - 49% Assistive device: No Device Max distance: 50   Walk 10 feet activity   Assist  Walk 10 feet activity did not occur: Safety/medical concerns  Assist level: Maximal Assistance - Patient 25 - 49% Assistive device: No Device   Walk 50 feet activity   Assist Walk 50 feet with 2 turns activity did not occur: Safety/medical concerns  Assist level: Maximal Assistance - Patient 25 - 49% Assistive device: No Device    Walk 150 feet activity   Assist Walk 150 feet activity did not occur: Safety/medical concerns         Walk 10 feet on uneven surface  activity   Assist Walk 10 feet on uneven surfaces activity did not occur: Safety/medical concerns         Wheelchair     Assist Will patient use  wheelchair at discharge?: No   Wheelchair activity did not occur: N/A         Wheelchair 50 feet with 2 turns activity    Assist    Wheelchair 50 feet with 2 turns activity did not occur: N/A       Wheelchair 150 feet activity     Assist Wheelchair 150 feet activity did not occur: N/A        Medical Problem List and Plan: 1.Dysarthria, right gaze deviation with decreased functional mobilitysecondary to right MCA infarction  Continue  CIR 2. Antithrombotics: -DVT/anticoagulation:Eliquis -antiplatelet therapy: N/A 3. Pain Management:Tylenol as needed 4. Mood:Provide emotional support -antipsychotic agents: N/A 5. Neuropsych: This patientiscapable of making decisions on hisown behalf. 6. Skin/Wound Care:Routine skin checks 7. Fluids/Electrolytes/Nutrition:  BMP within acceptable range on 6/29  Encourage fluids 8. Dysphagia.   D2 thin-advance that as tolerated 9. Complete heart block. 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  10. New onset atrial fibrillation.  Continue Eliquis as well as initiation of Toprol-XL 25 mg daily Vitals:   06/04/19 1347 06/04/19 1953  BP:  130/89  Pulse: (!) 59 72  Resp:  18  Temp:  98 F (36.7 C)  SpO2: 98% 99%   Controlled on 7/5 11. Hyperlipidemia. Crestor 12. BPH. Proscar 5 mg daily 13. Hypertension. Patient on lisinopril 20 mg daily prior to admission      Controlled on 7/5 14. GERD. Protonix 15. Leukocytosis:  WBCs 14.5 on 7/1, labs ordered for tomorrow  Afebrile  CXR - , UA reviewed, unremarkable, urine culture with 50,000 16.CKD. Baseline creatinine 1.28-1.35.   Cr. 1.04 on 6/29, labs ordered for tomorrow  Cont to monitor 17.  Right great toe pain:  Uric acid within normal limits  X-ray reviewed, showing OA  Voltaren gel ordered  Improving  LOS: 9 days A FACE TO FACE EVALUATION WAS PERFORMED  Ankit Lorie Phenix 06/05/2019, 10:14 AM

## 2019-06-06 ENCOUNTER — Inpatient Hospital Stay (HOSPITAL_COMMUNITY): Payer: Medicare Other | Admitting: Occupational Therapy

## 2019-06-06 ENCOUNTER — Encounter (HOSPITAL_COMMUNITY): Payer: Medicare Other | Admitting: Occupational Therapy

## 2019-06-06 ENCOUNTER — Encounter (HOSPITAL_COMMUNITY): Payer: Medicare Other

## 2019-06-06 ENCOUNTER — Ambulatory Visit (HOSPITAL_COMMUNITY): Payer: Medicare Other | Admitting: Physical Therapy

## 2019-06-06 LAB — CBC WITH DIFFERENTIAL/PLATELET
Abs Immature Granulocytes: 0.06 10*3/uL (ref 0.00–0.07)
Basophils Absolute: 0.1 10*3/uL (ref 0.0–0.1)
Basophils Relative: 1 %
Eosinophils Absolute: 0.4 10*3/uL (ref 0.0–0.5)
Eosinophils Relative: 3 %
HCT: 47 % (ref 39.0–52.0)
Hemoglobin: 15.3 g/dL (ref 13.0–17.0)
Immature Granulocytes: 1 %
Lymphocytes Relative: 21 %
Lymphs Abs: 2.2 10*3/uL (ref 0.7–4.0)
MCH: 29.4 pg (ref 26.0–34.0)
MCHC: 32.6 g/dL (ref 30.0–36.0)
MCV: 90.4 fL (ref 80.0–100.0)
Monocytes Absolute: 1.2 10*3/uL — ABNORMAL HIGH (ref 0.1–1.0)
Monocytes Relative: 11 %
Neutro Abs: 6.5 10*3/uL (ref 1.7–7.7)
Neutrophils Relative %: 63 %
Platelets: 486 10*3/uL — ABNORMAL HIGH (ref 150–400)
RBC: 5.2 MIL/uL (ref 4.22–5.81)
RDW: 13.5 % (ref 11.5–15.5)
WBC: 10.4 10*3/uL (ref 4.0–10.5)
nRBC: 0 % (ref 0.0–0.2)

## 2019-06-06 LAB — BASIC METABOLIC PANEL
Anion gap: 10 (ref 5–15)
BUN: 28 mg/dL — ABNORMAL HIGH (ref 8–23)
CO2: 26 mmol/L (ref 22–32)
Calcium: 9.1 mg/dL (ref 8.9–10.3)
Chloride: 103 mmol/L (ref 98–111)
Creatinine, Ser: 1.28 mg/dL — ABNORMAL HIGH (ref 0.61–1.24)
GFR calc Af Amer: 57 mL/min — ABNORMAL LOW (ref >60)
GFR calc non Af Amer: 49 mL/min — ABNORMAL LOW (ref >60)
Glucose, Bld: 72 mg/dL (ref 70–99)
Potassium: 4.5 mmol/L (ref 3.5–5.1)
Sodium: 139 mmol/L (ref 135–145)

## 2019-06-06 NOTE — Discharge Summary (Signed)
Physician Discharge Summary  Patient ID: Jose Castro MRN: [id number] DOB/AGE: 1926/08/08 85 y.o.  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 EXAM: CHEST  1 VIEW COMPARISON:  05/22/2019. FINDINGS: Cardiac pacer with lead tip over the right atrium and right ventricle. Cardiomegaly with normal pulmonary vascularity. No focal infiltrate. Mild left base subsegmental atelectasis. No pleural effusion or pneumothorax. Degenerative change thoracic spine. IMPRESSION: 1. Cardiac pacer with lead tip over the right atrium right ventricle. Cardiomegaly. 2.  Mild left base subsegmental atelectasis. Electronically Signed   By: Marcello Moores  Register   On: 05/24/2019 14:24   Dg Chest 2 View  Result Date: 06/01/2019 CLINICAL DATA:  Lethargic today. EXAM: CHEST - 2 VIEW COMPARISON:  05/24/2019 FINDINGS: Dual lead pacemaker appears the same. Heart size is normal. Chronic thoracic aortic atherosclerosis and tortuosity. The lungs are clear. The vascularity is normal. No effusions. IMPRESSION: No active cardiopulmonary disease. Electronically Signed   By: Nelson Chimes M.D.   On: 06/01/2019 10:30   Ct Head Wo Contrast  Result Date: 05/23/2019 CLINICAL DATA:  Stroke. EXAM: CT HEAD WITHOUT CONTRAST TECHNIQUE: Contiguous axial images were obtained from the base of the skull through the vertex without intravenous contrast. COMPARISON:  CT head 05/21/2019 FINDINGS: Brain: Acute right MCA infarct. Progressive  low-density in the right anterior temporal lobe and right posterior temporal lobe compared to the prior study compatible with progressive edema from infarction. There is some sparing of the central portion of the temporal lobe and sparing of the insula. Hypodensity in the posterior right external capsule is larger and lower density compared to the prior study compatible with acute infarction. No associated hemorrhage. Minimal midline shift to the left due to edema. Ventricle size normal. Vascular: Negative for hyperdense vessel Skull: Negative Sinuses/Orbits: Paranasal sinuses clear. Bilateral cataract surgery. Other: None IMPRESSION: Acute right MCA infarct. Progressive cytotoxic edema in the right anterior and posterior temporal lobe and in the posterior limb internal capsule on the right. No associated hemorrhage. Mild midline shift to the left due to edema. Electronically Signed   By: Franchot Gallo M.D.   On: 05/23/2019 14:59   US Carotid Bilateral (at [facility] Only)  Result Date: 05/23/2019 CLINICAL DATA:  85 year old male for with a history of stroke EXAM: BILATERAL CAROTID DUPLEX ULTRASOUND TECHNIQUE: Pearline Cables scale imaging, color Doppler and duplex ultrasound were performed of bilateral carotid and vertebral arteries in the neck. COMPARISON:  No prior duplex FINDINGS: Criteria: Quantification of carotid stenosis is based on velocity parameters that correlate the residual internal carotid diameter with NASCET-based stenosis levels, using the diameter of the distal internal carotid lumen as the denominator for stenosis measurement. The following velocity measurements were obtained: RIGHT ICA:  Systolic 409 cm/sec, Diastolic 5 cm/sec CCA:  40 cm/sec SYSTOLIC ICA/CCA RATIO:  2.7 ECA:  86 cm/sec LEFT ICA:  Systolic 84 cm/sec, Diastolic 29 cm/sec CCA:  60 cm/sec SYSTOLIC ICA/CCA RATIO:  1.4 ECA:  77 cm/sec Right Brachial SBP: Not acquired Left  Brachial SBP: Not acquired RIGHT CAROTID ARTERY: No significant  calcifications of the right common carotid artery. High resistance waveform maintained. Moderate heterogeneous and partially calcified plaque at the right carotid bifurcation. No significant lumen shadowing. High resistance waveform of the right ICA. Mild tortuosity RIGHT VERTEBRAL ARTERY: Antegrade flow with low resistance waveform. LEFT CAROTID ARTERY: No significant calcifications of the left common carotid artery. Intermediate waveform maintained. Moderate heterogeneous and partially calcified plaque at the left carotid bifurcation. No significant lumen shadowing. Low resistance waveform of the left ICA. No significant tortuosity. LEFT VERTEBRAL ARTERY:  Antegrade flow with low resistance waveform. IMPRESSION: Right: Heterogeneous and partially calcified plaque at the right carotid bifurcation, with duplex evidence of either a cervical ICA or intracranial ICA occlusion. If warranted, further evaluation with either cerebral angiogram, or CT angiogram may be considered. Left: Color duplex indicates minimal heterogeneous and calcified plaque, with no hemodynamically significant stenosis by duplex criteria in the extracranial cerebrovascular circulation. Signed, Dulcy Fanny. Dellia Nims, RPVI [facility] Electronically Signed   By: Corrie Mckusick D.O.   On: 05/23/2019 07:39   Dg Chest Port 1 View  Result Date: 05/22/2019 CLINICAL DATA:  Shortness of breath. EXAM: PORTABLE CHEST 1 VIEW COMPARISON:  Radiograph yesterday. Chest CT 12/14/2015 FINDINGS: Left-sided pacemaker remains in place. Normal heart size and mediastinal contours with aortic atherosclerosis. Unchanged vascular congestion. No acute airspace disease. No pleural fluid or pneumothorax. IMPRESSION: Unchanged vascular congestion. No new abnormality. Electronically Signed   By: Keith Rake M.D.   On: 05/22/2019 23:31   Dg Chest Portable 1 View  Result Date: 05/21/2019 CLINICAL DATA:   Unwitnessed fall. EXAM: PORTABLE CHEST 1 VIEW COMPARISON:  07/17/2018 FINDINGS: Stable dual lead cardiac pacemaker. Cardiomediastinal silhouette is normal. Mediastinal contours appear intact. Calcific atherosclerotic disease of the aorta. There is no evidence of focal airspace consolidation, pleural effusion or pneumothorax. Mildly increased interstitial markings. Osseous structures are without acute abnormality. Soft tissues are grossly normal. IMPRESSION: Mildly increased interstitial markings which may be seen with pulmonary vascular congestion. Electronically Signed   By: Fidela Salisbury M.D.   On: 05/21/2019 18:26   Dg Foot 2 Views Right  Result Date: 06/03/2019 CLINICAL DATA:  Great toe pain. EXAM: RIGHT FOOT - 2 VIEW COMPARISON:  None. FINDINGS: No acute fracture or dislocation. Severe first MTP joint space narrowing with marginal osteophytes. Remaining joint spaces are preserved. Bone mineralization is normal. Soft tissues are unremarkable. IMPRESSION: 1. Severe first MTP joint osteoarthritis. Electronically Signed   By: Titus Dubin M.D.   On: 06/03/2019 12:07   Dg Swallowing Func-speech Pathology  Result Date: 05/31/2019 Objective Swallowing Evaluation: Type of Study: Bedside Swallow Evaluation  Patient Details Name: BRAYAM BOEKE MRN: [id number] Date of Birth: 1926/08/11 Today's Date: 05/31/2019 Time: SLP Start Time (ACUTE ONLY): 54 -SLP Stop Time (ACUTE ONLY): 1045 SLP Time Calculation (min) (ACUTE ONLY): 40 min Past Medical History: Past Medical History: Diagnosis Date . Arthritis  . BPH (benign prostatic hyperplasia)  . Chronic kidney disease   had a kidney stone which per family was a cyst that was removed . Dysrhythmia  . GERD (gastroesophageal reflux disease)  . History of kidney stones  . Hyperlipidemia  . Hypertension  . Splenic vein thrombosis  . Stroke ([place]) 05/22/2019 . Thrombosis 12/2015  mural  area and no notation of heart attack Past Surgical History: Past Surgical History:  Procedure Laterality Date . APPENDECTOMY   . CATARACT EXTRACTION, BILATERAL   . CYSTOSCOPY WITH  INSERTION OF UROLIFT   . KIDNEY STONE SURGERY   . KNEE SURGERY   . PACEMAKER INSERTION N/A 07/13/2018  Procedure: INSERTION PACEMAKER-DUEL CHAMBER INITIAL IMPLANT;  Surgeon: Isaias Cowman, MD;  Location: [facility] ORS;  Service: Cardiovascular;  Laterality: N/A; . SPINE SURGERY   HPI: Pt is a 85 y.o. male with a known history of hypertension, GERD, hyperlipidemia, pacemaker placement d/t bradycardia, and other multiple medical issues per chart notes.  Patient presented to the emergency room after being found on the floor of his home earlier in the day.  Patient was last seen well at 1 PM on yesterday.  EMS reported left fixed eye deviation to the right.  This has improved slightly by the time I am seeing the patient.  Patient's granddaughter is at the bedside.  Patient is awake, alert, oriented to person, place, and time.  However, he does not recall events surrounding the last 24 hours.  He is somewhat of a poor historian with slight confusion at times.  Today, pt is awake(had been min lethargic s/p admission), A/O x4. He worked w/ OT during session following all commands. He appears to fatigue easily.  Pt appears min HOH.  Subjective: pt awake, sitting upright in bed post OT session. Pt verbally conversive w/ OT, SLP; Granddaughter in room also. He answered questions; A/O x4. Noted min fatigue as session continued. Assessment / Plan / Recommendation CHL IP CLINICAL IMPRESSIONS 05/31/2019 Clinical Impression Pt presents with mild oral phase deficits c/b decreased lingual manipulation, decreased bolus cohesion which resulted in prolonged oral phase espceivally with solids. Additionally, residue can be seen under tongue on fluro with ~ 2 swallows per bolus to effectively clear oral cavity. Pt's pharyngeal phase appears appropriate in initiation with slight penetration during swallow. Pt with one instance of sensed  aspiration but otherwise he was able to establish ocmplete airway protection. Recommend pt continue dysphagia 2 with upgrade to thin liquids, medicine whole in puree and full supervision. SLP Visit Diagnosis Dysphagia, oropharyngeal phase (R13.12) Attention and concentration deficit following -- Frontal lobe and executive function deficit following -- Impact on safety and function Mild aspiration risk   CHL IP TREATMENT RECOMMENDATION 05/31/2019 Treatment Recommendations Therapy as outlined in treatment plan below   Prognosis 05/24/2019 Prognosis for Safe Diet Advancement Fair Barriers to Reach Goals (No Data) Barriers/Prognosis Comment -- CHL IP DIET RECOMMENDATION 05/31/2019 SLP Diet Recommendations Dysphagia 2 (Fine chop) solids;Thin liquid Liquid Administration via Cup;Straw Medication Administration Whole meds with puree Compensations Minimize environmental distractions;Slow rate;Small sips/bites Postural Changes Seated upright at 90 degrees   CHL IP OTHER RECOMMENDATIONS 05/31/2019 Recommended Consults -- Oral Care Recommendations Oral care BID Other Recommendations --   CHL IP FOLLOW UP RECOMMENDATIONS 05/31/2019 Follow up Recommendations Inpatient Rehab   CHL IP FREQUENCY AND DURATION 05/24/2019 Speech Therapy Frequency (ACUTE ONLY) min 3x week Treatment Duration 2 weeks      CHL IP ORAL PHASE 05/31/2019 Oral Phase Impaired Oral - Pudding Teaspoon -- Oral - Pudding Cup -- Oral - Honey Teaspoon -- Oral - Honey Cup -- Oral - Nectar Teaspoon Weak lingual manipulation;Delayed oral transit Oral - Nectar Cup Weak lingual manipulation;Delayed oral transit Oral - Nectar Straw -- Oral - Thin Teaspoon Weak lingual manipulation Oral - Thin Cup Weak lingual manipulation;Other (Comment) Oral - Thin Straw Weak lingual manipulation;Other (Comment) Oral - Puree -- Oral - Mech Soft -- Oral - Regular -- Oral - Multi-Consistency Weak lingual manipulation;Impaired mastication;Reduced posterior propulsion;Decreased bolus  cohesion;Delayed oral transit;Premature spillage Oral - Pill --  Oral Phase - Comment --  CHL IP PHARYNGEAL PHASE 05/31/2019 Pharyngeal Phase Impaired Pharyngeal- Pudding Teaspoon -- Pharyngeal -- Pharyngeal- Pudding Cup -- Pharyngeal -- Pharyngeal- Honey Teaspoon -- Pharyngeal -- Pharyngeal- Honey Cup -- Pharyngeal -- Pharyngeal- Nectar Teaspoon WFL Pharyngeal -- Pharyngeal- Nectar Cup WFL Pharyngeal -- Pharyngeal- Nectar Straw -- Pharyngeal -- Pharyngeal- Thin Teaspoon WFL Pharyngeal -- Pharyngeal- Thin Cup WFL Pharyngeal -- Pharyngeal- Thin Straw WFL Pharyngeal -- Pharyngeal- Puree -- Pharyngeal -- Pharyngeal- Mechanical Soft -- Pharyngeal -- Pharyngeal- Regular -- Pharyngeal -- Pharyngeal- Multi-consistency WFL Pharyngeal -- Pharyngeal- Pill -- Pharyngeal -- Pharyngeal Comment --  CHL IP CERVICAL ESOPHAGEAL PHASE 05/31/2019 Cervical Esophageal Phase WFL Pudding Teaspoon -- Pudding Cup -- Honey Teaspoon -- Honey Cup -- Nectar Teaspoon -- Nectar Cup -- Nectar Straw -- Thin Teaspoon -- Thin Cup -- Thin Straw -- Puree -- Mechanical Soft -- Regular -- Multi-consistency -- Pill -- Cervical Esophageal Comment -- Happi Overton 05/31/2019, 3:32 PM              Ct Head Code Stroke Wo Contrast  Result Date: 05/21/2019 CLINICAL DATA:  Code stroke. Unwitnessed fall at home. Last seen normal 7 p.m. 05/20/2019. Clinically, RIGHT gaze preference with LEFT hemiparesis. EXAM: CT HEAD WITHOUT CONTRAST TECHNIQUE: Contiguous axial images were obtained from the base of the skull through the vertex without intravenous contrast. COMPARISON:  None. FINDINGS: The patient was unable to remain motionless for the exam. Small or subtle lesions could be overlooked. Assessment of ASPECTS score is limited. Brain: There are multiple areas of hypoattenuation, in the RIGHT hemisphere, consistent with acute infarction. These involve the RIGHT basal ganglia and internal capsule, insula, anterior and posterior temporal lobe, without visible  hemorrhage. Elsewhere there is generalized atrophy with chronic microvascular ischemic change. BILATERAL extra-axial CSF collections likely represent atrophy rather than hygromas. Vascular: Calcification of the cavernous internal carotid arteries consistent with cerebrovascular atherosclerotic disease. Asymmetric hyperattenuation of the RIGHT ICA and MCA could represent a large vessel occlusion, but assessment is limited due to motion. Skull: Intact Sinuses/Orbits: No acute finding. Other: None. ASPECTS [facility] Stroke Program Early CT Score) - Ganglionic level infarction (caudate, lentiform nuclei, internal capsule, insula, M1-M3 cortex): 3 - Supraganglionic infarction (M4-M6 cortex): 3 Total score (0-10 with 10 being normal): 6 IMPRESSION: 1. Motion degraded exam demonstrating multiple areas of acute nonhemorrhagic infarction in the RIGHT hemisphere. 2. ASPECTS is 6, could be less.  Assessment is limited. 3. Asymmetric hyperattenuation of the RIGHT ICA and MCA could represent large vessel occlusion. These results were called by telephone at the time of interpretation on 05/21/2019 at 6:10 pm to Dr. Delman Kitten , who verbally acknowledged these results. * Electronically Signed   By: Staci Righter M.D.   On: 05/21/2019 18:15    Labs:  Basic Metabolic Panel: Recent Labs  Lab 06/06/19 1039  NA 139  K 4.5  CL 103  CO2 26  GLUCOSE 72  BUN 28*  CREATININE 1.28*  CALCIUM 9.1    CBC: Recent Labs  Lab 06/01/19 1210 06/06/19 1039  WBC 14.5* 10.4  NEUTROABS 10.2* 6.5  HGB 16.2 15.3  HCT 49.8 47.0  MCV 90.4 90.4  PLT 435* 486*    CBG: No results for input(s): GLUCAP in the last 168 hours.   Family history.  Brother with bladder cancer.  Mother with CAD as well as aortic aneurysm.  Maternal aunt with CAD.  Denies any diabetes mellitus or colon cancer  Brief HPI:   Jose Castro is a  85 year old right-handed male history of hypertension, CKD stage II, dysrhythmia with heart block status post  pacemaker August 2019.  Lives alone independent prior to admission.  Family to assist as needed on discharge.  Presented 05/22/2019 after being found down on the floor at home with reported left fixed eye deviation to the right.  WBC 25,400, urinalysis negative, COVID negative, CK 970, blood cultures no growth to date.  Chest x-ray no focal infiltrate.  Cranial CT scan showed multiple areas of acute nonhemorrhagic infarct right hemisphere.  MRI not completed due to pacemaker.  Echocardiogram with ejection fraction of 53% normal systolic function.  Hospital course atrial fibrillation with cardiology services consulted borderline troponin  0.05 felt to be related to demand ischemia.  Initially on aspirin for CVA prophylaxis changed to Eliquis 05/26/2019.  Dysphasia #2 honey thick liquids.  WBC is improved to 14,800.  Patient was admitted for a comprehensive rehab program  Hospital Course: Jose Castro was admitted to rehab 05/27/2019 for inpatient therapies to consist of PT, ST and OT at least three hours five days a week. Past admission physiatrist, therapy team and rehab RN have worked together to provide customized collaborative inpatient rehab.  Pertaining to patient right MCA infarction remained stable follow-up neurology services.  Eliquis for CVA prophylaxis as well as new onset atrial fibrillation.  No chest pain or shortness of breath noted history of pacemaker August 2019.  Patient would follow cardiology services.  Crestor ongoing for hyperlipidemia.  Noted history of BPH remaining on Proscar.  CKD baseline creatinine 1.28-1.35.  Blood pressure remained controlled with Toprol noted permissive hypertension follow-up with cardiology services.  Physical exam.  Blood pressure 125/57 pulse 60 temperature 99 respirations 18 oxygen saturations 100% Neurological. General.  No apparent distress HEENT head is normocephalic atraumatic EOMs intact oral mucosa pink and moist Neck.  Supple nontender without JVD or  lymphadenopathy Heart.  Regular rate rhythm no murmurs rubs or gallops Chest.  Clear to auscultation without wheezes rales or rhonchi no distress Abdomen.  Soft nontender nondistended bowel sounds positive Extremities.  No clubbing cyanosis or edema Neurological.  Patient alert oriented some word finding difficulties with apraxia comprehension appears generally intact right central 7.  Left upper and left lower extremity 4 to 4+ out of 5 right upper and right lower extremity 5 out of 5  Rehab course: During patient's stay in rehab weekly team conferences were held to monitor patient's progress, set goals and discuss barriers to discharge. At admission, patient required minimum to moderate assist 24 feet x 2 rolling walker, minimal moderate assist sit to stand, minimal assist supine to sit.  Minimal assist upper body bathing minimal assist lower body bathing minimal assist upper body dressing minimal assist lower body dressing.  He  has had improvement in activity tolerance, balance, postural control as well as ability to compensate for deficits. He has had improvement in functional use RUE/LUE  and RLE/LLE as well as improvement in awareness.  Ambulating rolling walker as well as stair negotiation handrails obstacle negotiation always supervision level.  Contact-guard assist required for curb stepping with lifting rolling walker.  He can gather his belongings for activities of daily living and homemaking.  His diet was a dysphagia #2 thin liquid followed by speech therapy.       Disposition: Discharge disposition: 01-Home or Self Care     Discharge to home   Diet: Dysphagia #2 thin liquids  Special Instructions: No smoking driving or alcohol  Medications at discharge 1.  Tylenol as needed 2.  Eliquis 2.5 mg twice daily 3.  Voltaren gel 4 times daily to affected area 4.  Proscar 5 mg p.o. daily 5.  Toprol-XL 25 mg daily 6.  Protonix 40 mg daily 7.  Crestor 40 mg p.o.  daily  Discharge Instructions    Ambulatory referral to Neurology   Complete by: As directed    An appointment is requested in approximately 4 to 6 weeks right MCA infarction   Ambulatory referral to Physical Medicine Rehab   Complete by: As directed    Moderate complexity follow-up 1 to 2 weeks right MCA infarction      Follow-up Information    Kirsteins, Luanna Salk, MD Follow up.   Specialty: Physical Medicine and Rehabilitation Why: Moderate complexity follow-up 1 to 2 weeks right MCA infarction Contact information: [place] [address] [phone]           Signed: Cathlyn Parsons 06/08/2019, 5:37 AM

## 2019-06-06 NOTE — Progress Notes (Signed)
[place] PHYSICAL MEDICINE & REHABILITATION PROGRESS NOTE   Subjective/Complaints:  No further toe pain.  No bowel or bladder issues other than occasional urinary incontinence  ROS: Denies CP, SOB, N/V/D  Objective:   No results found. No results for input(s): WBC, HGB, HCT, PLT in the last 72 hours. No results for input(s): NA, K, CL, CO2, GLUCOSE, BUN, CREATININE, CALCIUM in the last 72 hours.  Intake/Output Summary (Last 24 hours) at 06/06/2019 0748 Last data filed at 06/05/2019 2018 Gross per 24 hour  Intake 478 ml  Output -  Net 478 ml     Physical Exam: Vital Signs Blood pressure 127/79, pulse 64, temperature 98.4 F (36.9 C), temperature source Oral, resp. rate 17, height 5\' 10"  (1.778 m), weight 75.8 kg, SpO2 93 %. Constitutional: No distress . Vital signs reviewed. HENT: Normocephalic.  Atraumatic. Eyes: EOMI.  No discharge. Cardiovascular: No JVD. Respiratory: Normal effort. GI: Non-distended. Musc: No edema or tenderness in extremities. Skin: Clean and intact without signs of breakdown, mild erythema over the first MTP Neuro:  Alert and oriented Motor: RUE/RLE: 5/5 proximal to distal Dense left homonymous hemianopsia. LUE/LLE: 4+/5 proximal to distal, stable Psych: Slow to process but answers appropriately with normal affect  Assessment/Plan: 1. Functional deficits secondary to right MCA infarct which require 3+ hours per day of interdisciplinary therapy in a comprehensive inpatient rehab setting.  Physiatrist is providing close team supervision and 24 hour management of active medical problems listed below.  Physiatrist and rehab team continue to assess barriers to discharge/monitor patient progress toward functional and medical goals  Care Tool:  Bathing    Body parts bathed by patient: Right arm, Left arm, Chest, Abdomen, Front perineal area, Buttocks, Right upper leg, Left upper leg, Face   Body parts bathed by helper: Right lower leg, Left lower  leg     Bathing assist Assist Level: Minimal Assistance - Patient > 75%     Upper Body Dressing/Undressing Upper body dressing   What is the patient wearing?: Pull over shirt    Upper body assist Assist Level: Supervision/Verbal cueing    Lower Body Dressing/Undressing Lower body dressing      What is the patient wearing?: Pants, Incontinence brief     Lower body assist Assist for lower body dressing: Minimal Assistance - Patient > 75%     Toileting Toileting    Toileting assist Assist for toileting: Contact Guard/Touching assist     Transfers Chair/bed transfer  Transfers assist     Chair/bed transfer assist level: Minimal Assistance - Patient > 75%     Locomotion Ambulation   Ambulation assist      Assist level: Minimal Assistance - Patient > 75% Assistive device: Walker-rolling Max distance: 50'   Walk 10 feet activity   Assist  Walk 10 feet activity did not occur: Safety/medical concerns  Assist level: Minimal Assistance - Patient > 75% Assistive device: Walker-rolling   Walk 50 feet activity   Assist Walk 50 feet with 2 turns activity did not occur: Safety/medical concerns  Assist level: Minimal Assistance - Patient > 75% Assistive device: Walker-rolling    Walk 150 feet activity   Assist Walk 150 feet activity did not occur: Safety/medical concerns         Walk 10 feet on uneven surface  activity   Assist Walk 10 feet on uneven surfaces activity did not occur: Safety/medical concerns         Wheelchair     Assist Will patient use  wheelchair at discharge?: No   Wheelchair activity did not occur: N/A  Wheelchair assist level: Minimal Assistance - Patient > 75% Max wheelchair distance: 19'    Wheelchair 50 feet with 2 turns activity    Assist    Wheelchair 50 feet with 2 turns activity did not occur: N/A       Wheelchair 150 feet activity     Assist Wheelchair 150 feet activity did not occur:  N/A        Medical Problem List and Plan: 1.Dysarthria, right gaze deviation with decreased functional mobilitysecondary to right MCA infarction CIR PT, OT, SLP 2. Antithrombotics: -DVT/anticoagulation:Eliquis -antiplatelet therapy: N/A 3. Pain Management:Tylenol as needed 4. Mood:Provide emotional support -antipsychotic agents: N/A 5. Neuropsych: This patientiscapable of making decisions on hisown behalf. 6. Skin/Wound Care:Routine skin checks 7. Fluids/Electrolytes/Nutrition:  BMP within acceptable range on 6/29  Encourage fluids 8. Dysphagia.   D2 thin- upgrade per SLP  Continue to advance as tolerated 9. Complete heart block. 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  10. New onset atrial fibrillation.  Continue Eliquis as well as initiation of Toprol-XL 25 mg daily Vitals:   06/05/19 1911 06/06/19 0317  BP: 125/64 127/79  Pulse: (!) 59 64  Resp: 16 17  Temp: 98.7 F (37.1 C) 98.4 F (36.9 C)  SpO2: 97% 93%  Controlled 7/6 6 11. Hyperlipidemia. Crestor 12. BPH. Proscar 5 mg daily 13. Hypertension. Patient on lisinopril 20 mg daily prior to admission      Controlled on 7/6 14. GERD. Protonix 15. Leukocytosis:  WBCs 12.1 on 6/29, now 14K  Afebrile  CXR - , will check UA  16.CKD. Baseline creatinine 1.28-1.35.   Cr. 1.04 on 6/29  Cont to monitor 17.  Right great toe pain has evidence of osteoarthritis ,normal uric acid , symptoms subsiding.  Likely OA related to use cast shoe if they recur LOS: 10 days A FACE TO FACE EVALUATION WAS PERFORMED  Jose Castro 06/06/2019, 7:48 AM

## 2019-06-06 NOTE — Progress Notes (Signed)
Occupational Therapy Session Note  Patient Details  Name: Jose Castro MRN: [id number] Date of Birth: 18-Oct-1926  Today's Date: 06/06/2019 OT Individual Time: 3235-5732 OT Individual Time Calculation (min): 32 min    Short Term Goals: Week 2:  OT Short Term Goal 1 (Week 2): Pt will perform 3/3 grooming tasks with mininmal cuing and supervision for standing balance OT Short Term Goal 2 (Week 2): Pt will scan to the left field with minimal cuing to obtain self care items OT Short Term Goal 3 (Week 2): Pt will complete toilet transfers with supervision  Skilled Therapeutic Interventions/Progress Updates:    Treatment session with focus on family education regarding self-care tasks and bathroom transfers with pt/daughter.  SLP completing education with daughter upon arrival with pt asleep in w/c.  Pt aroused to stimulus, but visibly worn out from back to back therapy sessions.  Pt somewhat awake throughout session, but unsafe to complete mobility at this time.  Discussed with pt's daughter pt's Lt inattention, decreased awareness of deficits and problem solving and therefore recommending close supervision with all mobility and supervision/question cues during self-care tasks to ensure thoroughness.  Pt's daughter reports having ambulated with pt during PT session and plans to return in AM for shower with therapy.  Educated on hemi-technique and use of adaptive techniques to increase safety during dressing.  Discussed fine and gross motor exercises as well as home management tasks to continue to address LUE weakness, incoordination, as well as Lt attention.  Pt's daughter pleased with information and plans to return in AM for further education.  Therapy Documentation Precautions:  Precautions Precautions: None Precaution Comments: swallowing Restrictions Weight Bearing Restrictions: No General:   Vital Signs: Therapy Vitals Temp: 97.7 F (36.5 C) Temp Source: Oral Pulse Rate: 60 Resp:  20 BP: 97/60 Patient Position (if appropriate): Sitting Oxygen Therapy SpO2: 98 % O2 Device: Room Air Pain: Pain Assessment Pain Score: 0-No pain   Therapy/Group: Individual Therapy  [name] 06/06/2019, 3:27 PM

## 2019-06-06 NOTE — Progress Notes (Signed)
Social Work Patient ID: Jose Castro, male   DOB: Apr 05, 1926, 85 y.o.   MRN: [id number] Met with pt and daughter-Lorraine who is here for education. She feels they can provide the care Dad requires and is agreeable to home health and wheelchair, he has all of the other recommended equipment. Work toward discharge Wed.

## 2019-06-06 NOTE — Progress Notes (Signed)
Physical Therapy Session Note  Patient Details  Name: Jose Castro MRN: [id number] Date of Birth: Feb 01, 1926  Today's Date: 06/06/2019 PT Individual Time: 1300-1345 PT Individual Time Calculation (min): 45 min   Short Term Goals: Week 2:  PT Short Term Goal 1 (Week 2): = LTG  Skilled Therapeutic Interventions/Progress Updates:    session focused on pt/family education with pt and his daughter.  Pt/daughter were educated and safely performed gait with RW, stair negotiation with 2 handrails, curb step negotiation and obstacle negotiation all at supervision level.  CGA required for curb step when lifting RW. Pt/daughter aware of PT recommendations for RW, w/c and HHPT.  Pt's daughter educated on home safety ideas and energy conservation and ideas for Lt attention. Pt's daughter states pt will have 24 hours supervision at home.  Pt/daughter state they feel ready for d/c home on Wednesday.  Therapy Documentation Precautions:  Precautions Precautions: None Precaution Comments: swallowing Restrictions Weight Bearing Restrictions: No Pain:  no c/o pain   Therapy/Group: Individual Therapy  DONAWERTH,KAREN 06/06/2019, 1:54 PM

## 2019-06-06 NOTE — Discharge Instructions (Signed)
Inpatient Rehab Discharge Instructions  Jose Castro Discharge date and time: No discharge date for patient encounter.   Activities/Precautions/ Functional Status: Activity: activity as tolerated Diet: Dysphasia #2 thin liquids Wound Care: none needed Functional status:  ___ No restrictions     ___ Walk up steps independently ___ 24/7 supervision/assistance   ___ Walk up steps with assistance ___ Intermittent supervision/assistance  ___ Bathe/dress independently ___ Walk with walker     _x__ Bathe/dress with assistance ___ Walk Independently    ___ Shower independently ___ Walk with assistance    ___ Shower with assistance ___ No alcohol     ___ Return to work/school ________  Special Instructions: No driving smoking or alcohol  COMMUNITY REFERRALS UPON DISCHARGE:    Home Health:   PT, OT, SP   Agency:[facility]   Phone:[phone]   Date of last service:  Medical Equipment/Items Ordered:WHEELCHAIR   Agency/Supplier:[phone]   GENERAL COMMUNITY RESOURCES FOR PATIENT/FAMILY: Support Groups:[facility] SUPPORT GROUP [facility] [phone] CALL REGARDING WHEN GROUP WILL RECONVENE  STROKE/TIA DISCHARGE INSTRUCTIONS SMOKING Cigarette smoking nearly doubles your risk of having a stroke & is the single most alterable risk factor  If you smoke or have smoked in the last 12 months, you are advised to quit smoking for your health.  Most of the excess cardiovascular risk related to smoking disappears within a year of stopping.  Ask you doctor about anti-smoking medications  [place] Quit Line: 1-800-QUIT NOW  Free Smoking Cessation Classes [phone]  CHOLESTEROL Know your levels; limit fat & cholesterol in your diet  Lipid Panel     Component Value Date/Time   CHOL 174 05/22/2019 0756   CHOL 220 (H) 11/19/2016 0955   TRIG 60 05/22/2019 0756   HDL 47 05/22/2019 0756   HDL 35 (L) 11/19/2016 0955   CHOLHDL 3.7 05/22/2019 0756   VLDL 12 05/22/2019 0756   LDLCALC 115  (H) 05/22/2019 0756   LDLCALC 120 (H) 06/15/2018 1617      Many patients benefit from treatment even if their cholesterol is at goal.  Goal: Total Cholesterol (CHOL) less than 160  Goal:  Triglycerides (TRIG) less than 150  Goal:  HDL greater than 40  Goal:  LDL (LDLCALC) less than 100   BLOOD PRESSURE American Stroke Association blood pressure target is less that 120/80 mm/Hg  Your discharge blood pressure is:  BP: 127/79  Monitor your blood pressure  Limit your salt and alcohol intake  Many individuals will require more than one medication for high blood pressure  DIABETES (A1c is a blood sugar average for last 3 months) Goal HGBA1c is under 7% (HBGA1c is blood sugar average for last 3 months)  Diabetes: No known diagnosis of diabetes    Lab Results  Component Value Date   HGBA1C 5.8 (H) 05/22/2019     Your HGBA1c can be lowered with medications, healthy diet, and exercise.  Check your blood sugar as directed by your physician  Call your physician if you experience unexplained or low blood sugars.  PHYSICAL ACTIVITY/REHABILITATION Goal is 30 minutes at least 4 days per week  Activity: Increase activity slowly, Therapies: Physical Therapy: Home Health Return to work:   Activity decreases your risk of heart attack and stroke and makes your heart stronger.  It helps control your weight and blood pressure; helps you relax and can improve your mood.  Participate in a regular exercise program.  Talk with your doctor about the best form of exercise for you (dancing,  walking, swimming, cycling).  DIET/WEIGHT Goal is to maintain a healthy weight  Your discharge diet is:  Diet Order            DIET DYS 2 Room service appropriate? Yes; Fluid consistency: Thin  Diet effective now              liquids Your height is:  Height: 5\' 10"  (177.8 cm) Your current weight is: Weight: 75.8 kg Your Body Mass Index (BMI) is:  BMI (Calculated): 23.98  Following the type of diet  specifically designed for you will help prevent another stroke.  Your goal weight range is:    Your goal Body Mass Index (BMI) is 19-24.  Healthy food habits can help reduce 3 risk factors for stroke:  High cholesterol, hypertension, and excess weight.  RESOURCES Stroke/Support Group:  Call [phone]   STROKE EDUCATION PROVIDED/REVIEWED AND GIVEN TO PATIENT Stroke warning signs and symptoms How to activate emergency medical system (call 911). Medications prescribed at discharge. Need for follow-up after discharge. Personal risk factors for stroke. Pneumonia vaccine given:  Flu vaccine given:  My questions have been answered, the writing is legible, and I understand these instructions.  I will adhere to these goals & educational materials that have been provided to me after my discharge from the hospital.      My questions have been answered and I understand these instructions. I will adhere to these goals and the provided educational materials after my discharge from the hospital.  Patient/Caregiver Signature _______________________________ Date __________  Clinician Signature _______________________________________ Date __________  Please bring this form and your medication list with you to all your follow-up doctor's appointments.

## 2019-06-06 NOTE — Progress Notes (Signed)
Speech Language Pathology Daily Session Note  Patient Details  Name: Jose Castro MRN: [id number] Date of Birth: Feb 19, 1926  Today's Date: 06/06/2019 SLP Individual Time: 1345-1430 SLP Individual Time Calculation (min): 45 min  Short Term Goals: Week 2: SLP Short Term Goal 1 (Week 2): Patient will utilize external memory aids to recall new daily information with min A verbal cues. SLP Short Term Goal 2 (Week 2): Patient will consume dysphagia 3 textures and thin liquids without overt s/sx of aspiration to demonstrate readiness for upgrade. SLP Short Term Goal 3 (Week 2): Patient will demonstrate functional problem solving for basic and familiar tasks with min A verbal cues. SLP Short Term Goal 4 (Week 2): Given Min A, pt will scan to left of his environment to locate items within problem solving tasks. SLP Short Term Goal 5 (Week 2): Pt will self-monitor and self-correct errors within functional tasks with  Mod A cues.  Skilled Therapeutic Interventions:Skilled ST services focused on family education and cognitive skills. Pt's daughter was present along with what appeared to be at 33 other family members on facetime. SLP facilitated basic problem solving skills utilizing functional task of money management, pt initially required mod A verbal cues for problem and max A verbal cues for error awareness is displaying requested amounts of change (ex: $0.65, $2.13), however demonstrated increase in skills when making simple change requiring min A verbal cues for problem solving and mod A verbal cues for error awareness. SLP also facilitated left scanning and recall in cancellation task for two letters in three opportunties, pt required min A verbal cues for error awareness. Pt demonstrated slight improvement in skills compared to pervious notes, however SLP provided education that some goals were down graded, problem solving and awareness, to more basic level due to lack of progress overall. SLP provided  education on memory strategies and diet given handout and on functional ways to address problem solving at home (ex: preferred crossword puzzles, managing multiple remotes, and participating in medication management.) All questions were answered to satisfaction from present family members and on family members via facetime. Pt was left in room with daughter and handed off to OT. ST recommends to continue skilled ST services.      Pain Pain Assessment Pain Score: 0-No pain  Therapy/Group: Individual Therapy  Arnel Wymer  [facility] 06/06/2019, 3:23 PM

## 2019-06-06 NOTE — Progress Notes (Signed)
Occupational Therapy Session Note  Patient Details  Name: Jose Castro MRN: [id number] Date of Birth: 1926-10-05  Today's Date: 06/06/2019 OT Individual Time: 0930-1030 OT Individual Time Calculation (min): 60 min    Short Term Goals: Week 2:  OT Short Term Goal 1 (Week 2): Pt will perform 3/3 grooming tasks with mininmal cuing and supervision for standing balance OT Short Term Goal 2 (Week 2): Pt will scan to the left field with minimal cuing to obtain self care items OT Short Term Goal 3 (Week 2): Pt will complete toilet transfers with supervision  Skilled Therapeutic Interventions/Progress Updates:    Treatment session with focus on ADL retraining with functional transfers, dynamic standing balance, and Lt attention.  Upon arrival, pt reports need to toilet.  Ambulated to toilet with RW and CGA, pt able to locate toilet on Lt without cues.  Pt completed clothing management and hygiene with close supervision.  Min cues for attention to Lt when exiting bathroom as pt ran RW in to doorframe on Lt with decreased awareness and ability to correct.  Engaged in oral care and washing face in standing at sink with close supervision. Pt donned clean shirt and shoes this session with setup for shirt and encouragement for pt to attempt donning shoes without assistance.  Engaged in table top activity in standing with focus on visual scanning and functional use of LUE.  Pt required min cues to continue to utilize LUE and increased time when scanning to obtain items in Lt visual field.  Returned to room and left upright in w/c with seat belt alarm on and all needs in reach.  Therapy Documentation Precautions:  Precautions Precautions: None Precaution Comments: swallowing Restrictions Weight Bearing Restrictions: No Pain: Pain Assessment Pain Scale: 0-10 Pain Score: 0-No pain   Therapy/Group: Individual Therapy  [name] 06/06/2019, 12:06 PM

## 2019-06-06 NOTE — Progress Notes (Signed)
Speech Language Pathology Discharge Summary  Patient Details  Name: Jose Castro MRN: [id number] Date of Birth: 01/08/1926  Today's Date: 06/15/2019 SLP Individual Time:  -      Skilled Therapeutic Interventions:   Skilled ST services focused on cognitive skills. SLP facilitated reassessment of cognitive skills with formal assessment MOCA version 7.3, pt score 21 out 30, compared on on evaluation where pt scored 20 out 30 on MOCA version 7.1 (n=.26.) Pt demonstrates positive cognitive changes, however continued impairments in problem solving, sustained attention, emergent awareness and left inattention. Pt responsed to questions with his eyes close after 20 minute and fell asleep at 25 minute requiring cues to become alert. SLP educated pt on assessment results and continued deficits all questioins were answered to satisfaction. SLP provided dys 3 trial snack, pt demonstrated oral clearance and no overt s/s aspiration. Pt was left in room with call bell within reach and bed alarm set.    Patient has met 8 of 8 long term goals.  Patient to discharge at overall Supervision;Mod;Min level.  Reasons goals not met:     Clinical Impression/Discharge Summary:   Pt met 8 out 8 goals, discharging at supervision A for swallowing and Min-Mod A for cognitive skills. Pt demonstrated improvement in speech intelligibility in conversation, basic problem solving, left inattention, error awareness, recall and sustained attention demonstrated during therapeutic skills and in formal assessments. Pt initial scored 20 out 30 on MOCA version 7.1 on evaluation and when reassessed at discharge score 21 out 30 on MOCA version 7.3 (n=>26.) Pt's overall cognitive goals were downgraded due to lack of progress impacted by reduced awareness and fatigue. SLP education daughter during in person participation, along with at last 5 present family members via facetime. SLP provided handouts on memory strategies and current diet, as well  as functional strategies to address problem solving and awareness at home. All questions were answered to satisfaction. Pt was upgraded from dys 1 and HTL to current diet, dys 2 and thin liquid diet with full supervision, following 6/30 MBS which noted reduced lingual manipulation and bolus cohesion with solids. SLP recommends continues dys 2 and thin diet, with solid advancements during follow up ST sessions.Pt would benefit from skilled ST services in order to maximize functional independence and reduce burden of care, requiring 24 hour supervision and continue ST services.  Care Partner:  Caregiver Able to Provide Assistance: Yes  Type of Caregiver Assistance: Physical;Cognitive  Recommendation:  Home Health SLP;24 hour supervision/assistance  Rationale for SLP Follow Up: Maximize cognitive function and independence;Maximize swallowing safety;Reduce caregiver burden   Equipment: N/A   Reasons for discharge: Discharged from hospital   Patient/Family Agrees with Progress Made and Goals Achieved: Yes    Djon Tith 06/07/2019, 2:16 PM

## 2019-06-07 ENCOUNTER — Inpatient Hospital Stay (HOSPITAL_COMMUNITY): Payer: Medicare Other

## 2019-06-07 ENCOUNTER — Inpatient Hospital Stay (HOSPITAL_COMMUNITY): Payer: Medicare Other | Admitting: Occupational Therapy

## 2019-06-07 ENCOUNTER — Inpatient Hospital Stay (HOSPITAL_COMMUNITY): Payer: Medicare Other | Admitting: Physical Therapy

## 2019-06-07 MED ORDER — DICLOFENAC SODIUM 1 % TD GEL: 2.0000 g | Freq: Four times a day (QID) | TRANSDERMAL | 0 refills | Status: DC

## 2019-06-07 MED ORDER — ROSUVASTATIN CALCIUM 40 MG PO TABS: 40.0000 mg | ORAL_TABLET | Freq: Every day | ORAL | 0 refills | Status: DC

## 2019-06-07 MED ORDER — PANTOPRAZOLE SODIUM 40 MG PO TBEC: 40.0000 mg | DELAYED_RELEASE_TABLET | Freq: Every day | ORAL | 1 refills | Status: DC

## 2019-06-07 MED ORDER — VITAMIN D3 50 MCG (2000 UT) PO TABS: 2000.0000 [IU] | ORAL_TABLET | Freq: Every day | ORAL | 0 refills | Status: AC

## 2019-06-07 MED ORDER — FINASTERIDE 5 MG PO TABS: 5.0000 mg | ORAL_TABLET | Freq: Every day | ORAL | 0 refills | Status: DC

## 2019-06-07 MED ORDER — METOPROLOL SUCCINATE ER 25 MG PO TB24: 25.0000 mg | ORAL_TABLET | Freq: Every day | ORAL | 0 refills | Status: AC

## 2019-06-07 MED ORDER — APIXABAN 2.5 MG PO TABS: 2.5000 mg | ORAL_TABLET | Freq: Two times a day (BID) | ORAL | 0 refills | Status: AC

## 2019-06-07 MED ORDER — ACETAMINOPHEN 325 MG PO TABS: 650.0000 mg | ORAL_TABLET | ORAL | Status: AC | PRN

## 2019-06-07 NOTE — Progress Notes (Signed)
[place] PHYSICAL MEDICINE & REHABILITATION PROGRESS NOTE   Subjective/Complaints:  No further toe pain.  Family training completed yesterday    ROS: Denies CP, SOB, N/V/D  Objective:   No results found. Recent Labs    06/06/19 1039  WBC 10.4  HGB 15.3  HCT 47.0  PLT 486*   Recent Labs    06/06/19 1039  NA 139  K 4.5  CL 103  CO2 26  GLUCOSE 72  BUN 28*  CREATININE 1.28*  CALCIUM 9.1    Intake/Output Summary (Last 24 hours) at 06/07/2019 0719 Last data filed at 06/06/2019 1808 Gross per 24 hour  Intake 800 ml  Output 250 ml  Net 550 ml     Physical Exam: Vital Signs Blood pressure 115/74, pulse 62, temperature 98.7 F (37.1 C), resp. rate 17, height 5\' 10"  (1.778 m), weight 75.8 kg, SpO2 96 %. Constitutional: No distress . Vital signs reviewed. HENT: Normocephalic.  Atraumatic. Eyes: EOMI.  No discharge. Cardiovascular: No JVD. Respiratory: Normal effort. GI: Non-distended. Musc: No edema or tenderness in extremities. Skin: Clean and intact without signs of breakdown, mild erythema over the first MTP Neuro:  Alert and oriented Motor: RUE/RLE: 5/5 proximal to distal Dense left homonymous hemianopsia. LUE/LLE: 4+/5 proximal to distal, stable Psych: Slow to process but answers appropriately with normal affect  Assessment/Plan: 1. Functional deficits secondary to right MCA infarct which require 3+ hours per day of interdisciplinary therapy in a comprehensive inpatient rehab setting.  Physiatrist is providing close team supervision and 24 hour management of active medical problems listed below.  Physiatrist and rehab team continue to assess barriers to discharge/monitor patient progress toward functional and medical goals  Care Tool:  Bathing    Body parts bathed by patient: Right arm, Left arm, Chest, Abdomen, Front perineal area, Buttocks, Right upper leg, Left upper leg, Face   Body parts bathed by helper: Right lower leg, Left lower leg      Bathing assist Assist Level: Minimal Assistance - Patient > 75%     Upper Body Dressing/Undressing Upper body dressing   What is the patient wearing?: Pull over shirt    Upper body assist Assist Level: Set up assist    Lower Body Dressing/Undressing Lower body dressing      What is the patient wearing?: Pants, Incontinence brief     Lower body assist Assist for lower body dressing: Minimal Assistance - Patient > 75%     Toileting Toileting    Toileting assist Assist for toileting: Supervision/Verbal cueing     Transfers Chair/bed transfer  Transfers assist     Chair/bed transfer assist level: Supervision/Verbal cueing     Locomotion Ambulation   Ambulation assist      Assist level: Supervision/Verbal cueing Assistive device: Walker-rolling Max distance: 150   Walk 10 feet activity   Assist  Walk 10 feet activity did not occur: Safety/medical concerns  Assist level: Supervision/Verbal cueing Assistive device: Walker-rolling   Walk 50 feet activity   Assist Walk 50 feet with 2 turns activity did not occur: Safety/medical concerns  Assist level: Supervision/Verbal cueing Assistive device: Walker-rolling    Walk 150 feet activity   Assist Walk 150 feet activity did not occur: Safety/medical concerns  Assist level: Supervision/Verbal cueing      Walk 10 feet on uneven surface  activity   Assist Walk 10 feet on uneven surfaces activity did not occur: Safety/medical concerns         Wheelchair  Assist Will patient use wheelchair at discharge?: No   Wheelchair activity did not occur: N/A  Wheelchair assist level: Minimal Assistance - Patient > 75% Max wheelchair distance: 50'    Wheelchair 50 feet with 2 turns activity    Assist    Wheelchair 50 feet with 2 turns activity did not occur: N/A       Wheelchair 150 feet activity     Assist Wheelchair 150 feet activity did not occur: N/A        Medical  Problem List and Plan: 1.Dysarthria, right gaze deviation with decreased functional mobilitysecondary to right MCA infarction CIR PT, OT, SLP, plan d/c in am  2. Antithrombotics: -DVT/anticoagulation:Eliquis -antiplatelet therapy: N/A 3. Pain Management:Tylenol as needed 4. Mood:Provide emotional support -antipsychotic agents: N/A 5. Neuropsych: This patientiscapable of making decisions on hisown behalf. 6. Skin/Wound Care:Routine skin checks 7. Fluids/Electrolytes/Nutrition:  BMP within acceptable range on 6/29  Encourage fluids 8. Dysphagia.   D2 thin good intake   Continue to advance as tolerated 9. Complete heart block. 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  10. New onset atrial fibrillation.  Continue Eliquis as well as initiation of Toprol-XL 25 mg daily Vitals:   06/06/19 2029 06/07/19 0447  BP: (!) 155/76 115/74  Pulse: (!) 59 62  Resp: 15 17  Temp: 98.9 F (37.2 C) 98.7 F (37.1 C)  SpO2: 94% 96%  Controlled 7/7 6 11. Hyperlipidemia. Crestor 12. BPH. Proscar 5 mg daily 13. Hypertension. Patient on lisinopril 20 mg daily prior to admission      Controlled on 7/7 some lability  14. GERD. Protonix 15. Leukocytosis:  Resolved WBCs 12.1 on 6/29, now 10.4   Afebrile   16.CKD. Baseline creatinine 1.28-1.35.   Cr. 1.04 on 6/29, 7/6 at baseline   Cont to monitor 17.  Right great toe pain has evidence of osteoarthritis ,normal uric acid , symptoms subsiding.  Likely OA related to use cast shoe if they recur LOS: 11 days A FACE TO FACE EVALUATION WAS PERFORMED  Charlett Blake 06/07/2019, 7:19 AM

## 2019-06-07 NOTE — Progress Notes (Signed)
Occupational Therapy Session Note  Patient Details  Name: Jose Castro MRN: [id number] Date of Birth: 23-Jun-1926  Today's Date: 06/07/2019 OT Individual Time: 0850-1004 OT Individual Time Calculation (min): 74 min    Short Term Goals: Week 2:  OT Short Term Goal 1 (Week 2): Pt will perform 3/3 grooming tasks with mininmal cuing and supervision for standing balance OT Short Term Goal 2 (Week 2): Pt will scan to the left field with minimal cuing to obtain self care items OT Short Term Goal 3 (Week 2): Pt will complete toilet transfers with supervision  Skilled Therapeutic Interventions/Progress Updates:    Completed ADL retraining at overall supervision level.  Pt's daughter present to complete family education as pt had been fatigued during previous session.  Pt received just transferring to toilet with nursing staff.  Pt completed toileting with distant supervision.  Min cues for problem solving when doffing clothing prior to shower.  Pt completed shower transfer and bathing at overall supervision level, min question cues for thoroughness and to terminate bathing when complete.  Dressing completed with setup assist and min question cues regarding donning socks and shoes.  Ultimately utilized step stool to increase success with donning and fastening shoes.  Provided pt with fine and gross motor HEP and discussed each recommended exercise as well as how and when to modify challenge with pt's daughter reporting understanding.  Completed 9 hole peg test, discussing results pertaining to impaired coordination as well as cognitive issues to include awareness and sequencing.  Pt's daughter pleased with physical progress and she and other family members are aware of cognitive and Lt inattention concerns impacting pt safety.  Therapy Documentation Precautions:  Precautions Precautions: None Precaution Comments: swallowing Restrictions Weight Bearing Restrictions: No General:   Vital Signs:    Pain:  Pt with no c/o pain   Therapy/Group: Individual Therapy  [name] 06/07/2019, 10:46 AM

## 2019-06-07 NOTE — Progress Notes (Signed)
Physical Therapy Discharge Summary  Patient Details  Name: Jose Castro MRN: [id number] Date of Birth: 1926/08/09  Today's Date: 06/07/2019 PT Individual Time: 3125-0871 PT Individual Time Calculation (min): 69 min   Session continued with pt/family education with pt's daughter.  Pt/daughter able to perform gait, stair negotiation, simulated car transfers, ramp and curb training all with supervision with RW.  Pt performs floor transfer with min A and pt/family educated on fall recovery and safety with both verbalizing understanding. Pt performs toileting and toilet transfers with supervision and min cuing.  Pt left in bed with all needs at hand, alarm set.  Patient has met 8 of 8 long term goals due to improved activity tolerance, improved balance, improved postural control, increased strength and ability to compensate for deficits.  Patient to discharge at an ambulatory level Supervision.   Patient's care partner is independent to provide the necessary physical and cognitive assistance at discharge.  Reasons goals not met: n/a  Recommendation:  Patient will benefit from ongoing skilled PT services in home health setting to continue to advance safe functional mobility, address ongoing impairments in balance, gait, mobility, and minimize fall risk.  Equipment: w/c  Reasons for discharge: treatment goals met and discharge from hospital  Patient/family agrees with progress made and goals achieved: Yes  PT Discharge Precautions/Restrictions Precautions Precautions: Fall Restrictions Weight Bearing Restrictions: No Pain Pain Assessment Pain Score: 0-No pain   Cognition Overall Cognitive Status: Impaired/Different from baseline Arousal/Alertness: Awake/alert Memory: Impaired Awareness Impairment: Anticipatory impairment Safety/Judgment: Impaired Sensation Sensation Light Touch: Appears Intact Proprioception: Impaired Detail Proprioception Impaired Details: Impaired  LLE Coordination Gross Motor Movements are Fluid and Coordinated: No Fine Motor Movements are Fluid and Coordinated: No Motor  Motor Motor: Motor apraxia;Hemiplegia Motor - Discharge Observations: improving strength and coordination  Mobility Bed Mobility Rolling Right: Supervision/verbal cueing Rolling Left: Supervision/Verbal cueing Supine to Sit: Supervision/Verbal cueing Sit to Supine: Supervision/Verbal cueing Transfers Sit to Stand: Supervision/Verbal cueing Stand to Sit: Supervision/Verbal cueing Stand Pivot Transfers: Supervision/Verbal cueing Locomotion  Gait Ambulation: Yes Gait Assistance: Supervision/Verbal cueing Gait Distance (Feet): 150 Feet Assistive device: Rolling walker Stairs / Additional Locomotion Stairs Assistance: Supervision/Verbal cueing Stair Management Technique: Two rails Number of Stairs: 12 Ramp: Supervision/Verbal cueing Curb: Contact Guard/Touching assist Wheelchair Mobility Wheelchair Mobility: No  Trunk/Postural Assessment  Cervical Assessment Cervical Assessment: (fwd head) Thoracic Assessment Thoracic Assessment: (mild kyphosis) Lumbar Assessment Lumbar Assessment: (posterior pelvic tilt) Postural Control Righting Reactions: delayed Protective Responses: delayed  Balance Static Standing Balance Static Standing - Level of Assistance: 5: Stand by assistance Dynamic Standing Balance Dynamic Standing - Level of Assistance: 5: Stand by assistance Extremity Assessment      RLE Assessment General Strength Comments: Globally 4/5 LLE Assessment General Strength Comments: grossly 4-/5    DONAWERTH,KAREN 06/07/2019, 11:21 AM

## 2019-06-07 NOTE — Progress Notes (Signed)
Occupational Therapy Discharge Summary  Patient Details  Name: Jose Castro MRN: [id number] Date of Birth: Feb 21, 1926  Patient has met 11 of 14 long term goals due to improved activity tolerance, improved balance, postural control, ability to compensate for deficits, functional use of  LEFT upper and LEFT lower extremity, improved attention, improved awareness and improved coordination.  Patient to discharge at overall Supervision level.  Patient's care partner is independent to provide the necessary physical and cognitive assistance at discharge.  Daughter has been present for family education and communicated with other family members in regards to providing supervision upon d/c.  Reasons goals not met: N/A  Recommendation:  Patient will benefit from ongoing skilled OT services in home health setting to continue to advance functional skills in the area of BADL and Reduce care partner burden.  Equipment: No equipment provided  Reasons for discharge: treatment goals met and discharge from hospital  Patient/family agrees with progress made and goals achieved: Yes  OT Discharge Precautions/Restrictions  Precautions Precautions: Fall Restrictions Weight Bearing Restrictions: No Pain Pain Assessment Pain Scale: 0-10 Pain Score: 0-No pain ADL ADL Eating: Supervision/safety Grooming: Setup, Supervision/safety Where Assessed-Grooming: Standing at sink Upper Body Bathing: Supervision/safety Where Assessed-Upper Body Bathing: Shower Lower Body Bathing: Supervision/safety Where Assessed-Lower Body Bathing: Shower Upper Body Dressing: Supervision/safety Where Assessed-Upper Body Dressing: Edge of bed, Sitting at sink Lower Body Dressing: Supervision/safety Where Assessed-Lower Body Dressing: Edge of bed, Sitting at sink Toileting: Supervision/safety Where Assessed-Toileting: Glass blower/designer: Close supervision Armed forces technical officer Method: Counselling psychologist:  Energy manager: Close supervision Social research officer, government Method: Heritage manager: Grab bars, Civil engineer, contracting with back Vision Baseline Vision/History: Wears glasses Wears Glasses: At all times Patient Visual Report: No change from baseline Vision Assessment?: Yes Eye Alignment: Within Functional Limits Ocular Range of Motion: Within Functional Limits Alignment/Gaze Preference: Within Defined Limits Perception  Perception: Impaired Inattention/Neglect: Does not attend to left visual field Praxis Praxis: Impaired Praxis Impairment Details: Perseveration Cognition Overall Cognitive Status: Impaired/Different from baseline Arousal/Alertness: Awake/alert Orientation Level: Oriented X4 Memory: Impaired Awareness: Impaired Awareness Impairment: Anticipatory impairment Problem Solving: Impaired Safety/Judgment: Impaired Sensation Sensation Light Touch: Appears Intact Proprioception: Impaired Detail Proprioception Impaired Details: Impaired LLE Coordination Gross Motor Movements are Fluid and Coordinated: No Fine Motor Movements are Fluid and Coordinated: No Coordination and Movement Description: decreased coordination 9 Hole Peg Test: Rt: 50 seconds, Lt: 1:26 Motor  Motor Motor: Motor apraxia;Hemiplegia Motor - Discharge Observations: improving strength and coordination Mobility  Bed Mobility Rolling Right: Supervision/verbal cueing Rolling Left: Supervision/Verbal cueing Supine to Sit: Supervision/Verbal cueing Sit to Supine: Supervision/Verbal cueing Transfers Sit to Stand: Supervision/Verbal cueing Stand to Sit: Supervision/Verbal cueing  Trunk/Postural Assessment  Cervical Assessment Cervical Assessment: (fwd head) Thoracic Assessment Thoracic Assessment: (mild kyphosis) Lumbar Assessment Lumbar Assessment: (posterior pelvic tilt) Postural Control Righting Reactions: delayed Protective Responses: delayed  Balance Static  Standing Balance Static Standing - Level of Assistance: 5: Stand by assistance Dynamic Standing Balance Dynamic Standing - Level of Assistance: 5: Stand by assistance Extremity/Trunk Assessment RUE Assessment RUE Assessment: Within Functional Limits LUE Assessment LUE Assessment: Exceptions to [facility] LUE Body System: Neuro Brunstrum levels for arm and hand: Arm;Hand Brunstrum level for arm: Stage V Relative Independence from Synergy Brunstrum level for hand: Stage VI Isolated joint movements LUE Strength LUE Overall Strength Comments: 4/5 LUE Tone LUE Tone: Within Functional Limits   Jose Castro 06/07/2019, 1:45 PM

## 2019-06-07 NOTE — Progress Notes (Signed)
Social Work Discharge Note   The overall goal for the admission was met for:   Discharge location: [place] 24 HR SUPERVISION  Length of Stay: Yes-11 DAYS  Discharge activity level: Yes-SUPERVISION-CGA LEVE  Home/community participation: Yes  Services provided included: MD, RD, PT, OT, SLP, RN, CM, Pharmacy and [place]: Private Insurance: [employer]-MEDICARE  Follow-up services arranged: Home Health: [facility]-PT,OT,SP, DME: [facility]-WHEELCHA[facility] and Patient/Family request agency HH: PREF, DME: NO PREF  Comments (or additional information):LORRAINE WAS HERE FOR FAMILY TRAINING AND IT WENT WELL, AWARE PT WILL NEED 24 HR SUPERVISION AT DC. FAMILY TO PROVIDE THIS LEVEL OF CARE  Patient/Family verbalized understanding of follow-up arrangements: Yes  Individual responsible for coordination of the follow-up plan: LORRAINE-DAUGHTER  Confirmed correct DME delivered: Elease Hashimoto 06/07/2019    Elease Hashimoto

## 2019-06-08 ENCOUNTER — Encounter: Payer: Self-pay | Admitting: Physical Medicine & Rehabilitation

## 2019-06-08 NOTE — Progress Notes (Signed)
Patient and family received discharge instructions from Marlowe Shores, PA-C with verbal understanding. Patient being discharged to home with family and patient belongings.

## 2019-06-08 NOTE — Progress Notes (Signed)
[facility] PHYSICAL MEDICINE & REHABILITATION PROGRESS NOTE   Subjective/Complaints:  No right toe pain, no new issues aware of D/C   ROS: Denies CP, SOB, N/V/D  Objective:   No results found. Recent Labs    06/06/19 1039  WBC 10.4  HGB 15.3  HCT 47.0  PLT 486*   Recent Labs    06/06/19 1039  NA 139  K 4.5  CL 103  CO2 26  GLUCOSE 72  BUN 28*  CREATININE 1.28*  CALCIUM 9.1    Intake/Output Summary (Last 24 hours) at 06/08/2019 0810 Last data filed at 06/07/2019 1930 Gross per 24 hour  Intake 200 ml  Output -  Net 200 ml     Physical Exam: Vital Signs Blood pressure 130/71, pulse 61, temperature 98.6 F (37 C), temperature source Oral, resp. rate 19, height 5\' 10"  (1.778 m), weight 75.8 kg, SpO2 93 %. Constitutional: No distress . Vital signs reviewed. HENT: Normocephalic.  Atraumatic. Eyes: EOMI.  No discharge. Cardiovascular: No JVD. Respiratory: Normal effort. GI: Non-distended. Musc: No edema or tenderness in extremities. Skin: Clean and intact without signs of breakdown, mild erythema over the first MTP Neuro:  Alert and oriented Motor: RUE/RLE: 5/5 proximal to distal Dense left homonymous hemianopsia. LUE/LLE: 4+/5 proximal to distal, stable Psych: Slow to process but answers appropriately with normal affect  Assessment/Plan: 1. Functional deficits secondary to right MCA infarct  Stable for D/C today F/u PCP in 3-4 weeks F/u PM&R 2 weeks See D/C summary See D/C instructions Care Tool:  Bathing    Body parts bathed by patient: Right arm, Left arm, Chest, Abdomen, Front perineal area, Buttocks, Right upper leg, Left upper leg, Face, Right lower leg, Left lower leg   Body parts bathed by helper: Right lower leg, Left lower leg     Bathing assist Assist Level: Supervision/Verbal cueing     Upper Body Dressing/Undressing Upper body dressing   What is the patient wearing?: Pull over shirt    Upper body assist Assist Level: Set up assist   Lower Body Dressing/Undressing Lower body dressing      What is the patient wearing?: Pants, Incontinence brief     Lower body assist Assist for lower body dressing: Supervision/Verbal cueing     Toileting Toileting    Toileting assist Assist for toileting: Supervision/Verbal cueing     Transfers Chair/bed transfer  Transfers assist     Chair/bed transfer assist level: Supervision/Verbal cueing     Locomotion Ambulation   Ambulation assist      Assist level: Supervision/Verbal cueing Assistive device: Walker-rolling Max distance: 150   Walk 10 feet activity   Assist  Walk 10 feet activity did not occur: Safety/medical concerns  Assist level: Supervision/Verbal cueing Assistive device: Walker-rolling   Walk 50 feet activity   Assist Walk 50 feet with 2 turns activity did not occur: Safety/medical concerns  Assist level: Supervision/Verbal cueing Assistive device: Walker-rolling    Walk 150 feet activity   Assist Walk 150 feet activity did not occur: Safety/medical concerns  Assist level: Supervision/Verbal cueing Assistive device: Walker-rolling    Walk 10 feet on uneven surface  activity   Assist Walk 10 feet on uneven surfaces activity did not occur: Safety/medical concerns   Assist level: Supervision/Verbal cueing Assistive device: Aeronautical engineer Will patient use wheelchair at discharge?: No   Wheelchair activity did not occur: N/A  Wheelchair assist level: Minimal Assistance - Patient > 75% Max wheelchair distance:  15'    Wheelchair 50 feet with 2 turns activity    Assist    Wheelchair 50 feet with 2 turns activity did not occur: N/A       Wheelchair 150 feet activity     Assist Wheelchair 150 feet activity did not occur: N/A        Medical Problem List and Plan: 1.Dysarthria, right gaze deviation with decreased functional mobilitysecondary to right MCA infarction CIR PT,  OT, SLP, plan d/c   2. Antithrombotics: -DVT/anticoagulation:Eliquis -antiplatelet therapy: N/A 3. Pain Management:Tylenol as needed 4. Mood:Provide emotional support -antipsychotic agents: N/A 5. Neuropsych: This patientiscapable of making decisions on hisown behalf. 6. Skin/Wound Care:Routine skin checks 7. Fluids/Electrolytes/Nutrition:  BMP within acceptable range on 6/29  Encourage fluids 8. Dysphagia.   D2 thin good intake   Continue to advance as tolerated 9. Complete heart block. 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  10. New onset atrial fibrillation.  Continue Eliquis as well as initiation of Toprol-XL 25 mg daily Vitals:   06/07/19 2011 06/08/19 0528  BP: 121/75 130/71  Pulse: 60 61  Resp: 19 19  Temp: 98.6 F (37 C) 98.6 F (37 C)  SpO2: 92% 93%  Controlled 7/8 6 11. Hyperlipidemia. Crestor 12. BPH. Proscar 5 mg daily 13. Hypertension. Patient on lisinopril 20 mg daily prior to admission      Controlled on 7/8  14. GERD. Protonix 15. Leukocytosis:  Resolved WBCs 12.1 on 6/29, now 10.4   Afebrile   16.CKD. Baseline creatinine 1.28-1.35.   Cr. 1.04 on 6/29, 7/6 at baseline   Cont to monitor 17.  Right great toe pain has evidence of osteoarthritis ,normal uric acid , symptoms subsiding.  Likely OA related to use cast shoe if they recur LOS: 12 days A FACE TO FACE EVALUATION WAS PERFORMED  Charlett Blake 06/08/2019, 8:10 AM

## 2019-06-13 ENCOUNTER — Telehealth: Payer: Self-pay

## 2019-06-13 NOTE — Telephone Encounter (Signed)
Tiffany, PT/[facility] called requesting verbal orders for HHPT 2wk4. Orders approved and given per discharge summary.

## 2019-06-14 ENCOUNTER — Telehealth: Payer: Self-pay

## 2019-06-14 NOTE — Telephone Encounter (Signed)
[place]  Patient Name: Jose Castro DOB: Apr 17, 1926 Appointment Date and Time: 06-22-2019 / 1100am  With: Zella Ball first then Dr. Letta Pate  Transitional Care Questions   Questions for our staff to ask patients on Transitional care 48 hour phone call:   1. Are you/is patient experiencing any problems since coming home? Are there any questions regarding any aspect of care? NO NEW PROBLEMS  2. Are there any questions regarding medications administration/dosing? Are meds being taken as prescribed? Patient should review meds with caller to confirm ISSUE WITH eliquis, MEDICATION IS 500 DOLLARS WITH PATIENTS INSURANCE.  UNAFFORDABLE, MAY NEED PRIOR AUTHORIZATION.  CHECKED WITH PHARMACY, THE PRICE IS STILL HIGH EVEN WITH INSURANCE.  CHECKED FORMULARY, NOTED THAT THE CLOPIDOGREL (PLAVIX) IS LISTED AS TEIR 1 AND MAY BE A CHEAPER ALTERNATIVE FOR THE PATIENT BUT WILL NEED TO BE SENT TO THE CARDIOLOGIST TO FILL., CALLED PATIENT BACK, LEFT MESSAGE TO RETURN CALL FOR THIS INFO  3. Have there been any falls? NO FALLS  4. Has Home Health been to the house and/or have they contacted you? If not, have you tried to contact them? Can we help you contact them? HOME HEALTH MADE CONTACT  5. Are bowels and bladder emptying properly? Are there any unexpected incontinence issues? If applicable, is patient following bowel/bladder programs? NO BOWEL AND BLADDER PROBLEMS  6. Any fevers, problems with breathing, unexpected pain? NO  7. Are there any skin problems or new areas of breakdown? NO  8. Has the patient/family member arranged specialty MD follow up (ie cardiology/neurology/renal/surgical/etc)? Can we help arrange? YES  9. Does the patient need any other services or support that we can help arrange? NO  10. Are caregivers following through as expected in assisting the patient? YES  11. Has the patient quit smoking, drinking alcohol, or using drugs as recommended? NA    [facility] [address] [phone]

## 2019-06-15 ENCOUNTER — Telehealth: Payer: Self-pay

## 2019-06-15 ENCOUNTER — Telehealth: Payer: Self-pay | Admitting: Family Medicine

## 2019-06-15 NOTE — Telephone Encounter (Signed)
Westley Hummer from [facility] verbal orders for speech therapy per Dr. Ancil Boozer.

## 2019-06-15 NOTE — Telephone Encounter (Signed)
Copied from [place] [id number]. Topic: Quick Communication - Home Health Verbal Orders >> Jun 15, 2019  9:38 AM Leward Quan A wrote: Caller/Agency: Hershal Coria / [facility] Number: [phone] ok to LM  Requesting OT/PT/Skilled Nursing/Social Work/Speech Therapy: Speech Therapy Frequency: 1 wk 1, 2 wk 4.

## 2019-06-15 NOTE — Telephone Encounter (Signed)
Marlowe Kays OT [facility] called requesting verbal orders for 2xwk X 1 followed by 1xwk X 3wks.  Called her back and approved verbal orders.

## 2019-06-22 ENCOUNTER — Other Ambulatory Visit: Payer: Self-pay

## 2019-06-22 ENCOUNTER — Encounter: Payer: Medicare Other | Attending: Registered Nurse | Admitting: Registered Nurse

## 2019-06-22 VITALS — BP 117/74 | HR 60 | Temp 97.5°F | Ht 70.5 in | Wt 170.0 lb

## 2019-06-22 DIAGNOSIS — I63511 Cerebral infarction due to unspecified occlusion or stenosis of right middle cerebral artery: Secondary | ICD-10-CM | POA: Diagnosis present

## 2019-06-22 DIAGNOSIS — M19071 Primary osteoarthritis, right ankle and foot: Secondary | ICD-10-CM | POA: Diagnosis not present

## 2019-06-22 DIAGNOSIS — I4891 Unspecified atrial fibrillation: Secondary | ICD-10-CM | POA: Insufficient documentation

## 2019-06-22 DIAGNOSIS — I10 Essential (primary) hypertension: Secondary | ICD-10-CM | POA: Diagnosis present

## 2019-06-22 NOTE — Progress Notes (Signed)
Subjective:    Patient ID: Jose Castro, male    DOB: [birth year], 85 y.o.   MRN: [id number]  HPI: Jose Castro is a 85 y.o. male who is here for hospital  follow up appointment for right middle cerebral artery stroke, new onset atrial fibrillation, hypertension and OA of right great toe joint .  On 05/21/2019, he presented to ED via EMS after being found down on the floor at home  And reported left fixed eye deviation to the right.  CT Head:  IMPRESSION: 1. Motion degraded exam demonstrating multiple areas of acute nonhemorrhagic infarction in the RIGHT hemisphere. 2. ASPECTS is 6, could be less.  Assessment is limited. 3. Asymmetric hyperattenuation of the RIGHT ICA and MCA could represent large vessel occlusion. Bilateral Carotid US:  IMPRESSION: Right:  Heterogeneous and partially calcified plaque at the right carotid bifurcation, with duplex evidence of either a cervical ICA or intracranial ICA occlusion. If warranted, further evaluation with either cerebral angiogram, or CT angiogram may be considered.  Left:  Color duplex indicates minimal heterogeneous and calcified plaque, with no hemodynamically significant stenosis by duplex criteria in the extracranial cerebrovascular circulation.  Cardiology and Neurology was consulted.   Jose Castro was admitted to inpatient rehabilitation on 05/27/2019 and discharged home on 06/08/2019, receiving outpatient therapy with [facility].  He states he has pain in his right great toe. He rated his pain 0. He's walking in his home with walker.   He arrived in his wheelchair.   His daughter in room, all questions answered.   Pain Inventory Average Pain 0 Pain Right Now 0 My pain is no pain  In the last 24 hours, has pain interfered with the following? General activity 0 Relation with others 0 Enjoyment of life 0 What TIME of day is your pain at its worst? no pain Sleep (in general) Good  Pain is worse with: no  pain Pain improves with: no pain Relief from Meds: no pain  Mobility use a walker how many minutes can you walk? 5 ability to climb steps?  yes do you drive?  no  Function retired Do you have any goals in this area?  yes  Neuro/Psych trouble walking  Prior Studies Any changes since last visit?  no  Physicians involved in your care Any changes since last visit?  yes Dr. Callwood-Cardiologist, [facility]-Opthamalogist. Dr. 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 at [employer] insecurity    Worry: Never true    Inability: Never true   Transportation needs    Medical: No    Non-medical: No  Tobacco Use   Smoking status: Former Smoker    Years: 16.00    Types: Cigarettes    Start date: 10/21/1944    Quit date: 10/21/1960  Years since quitting: 58.7   Smokeless tobacco: Never Used  Substance and Sexual Activity   Alcohol use: Yes    Alcohol/week: 3.0 standard drinks    Types: 3 Standard drinks or equivalent per week    Comment: once a week when he goes out to dinner-socially   Drug use: No   Sexual activity: Yes    Partners: Female  Lifestyle   Physical activity    Days per week: 0 days    Minutes per session: 0 min   Stress: Not at all  Relationships   Social connections    Talks on phone: More than three times a week    Gets together: Twice a week    Attends religious service: More than 4 times  per year    Active member of club or organization: Yes    Attends meetings of clubs or organizations: More than 4 times per year    Relationship status: Widowed  Other Topics Concern   Not on file  Social History Narrative   Widow, has a good networks of friends and family    Past Surgical History:  Procedure Laterality Date   APPENDECTOMY     CATARACT EXTRACTION, BILATERAL     CYSTOSCOPY WITH INSERTION OF UROLIFT     KIDNEY STONE SURGERY     KNEE SURGERY     PACEMAKER INSERTION N/A 07/13/2018   Procedure: INSERTION PACEMAKER-DUEL CHAMBER INITIAL IMPLANT;  Surgeon: Isaias Cowman, MD;  Location: [facility] ORS;  Service: Cardiovascular;  Laterality: N/A;   SPINE SURGERY     Past Medical History:  Diagnosis Date   Arthritis    BPH (benign prostatic hyperplasia)    Chronic kidney disease    had a kidney stone which per family was a cyst that was removed   Dysrhythmia    GERD (gastroesophageal reflux disease)    History of kidney stones    Hyperlipidemia    Hypertension    Splenic vein thrombosis    Stroke ([place]) 05/22/2019   Thrombosis 12/2015   mural  area and no notation of heart attack   BP 117/74    Pulse 60    Temp (!) 97.5 F (36.4 C)    Ht 5' 10.5" (1.791 m)    Wt 170 lb (77.1 kg)    SpO2 92%    BMI 24.05 kg/m   Opioid Risk Score:   Fall Risk Score:  `1  Depression screen PHQ 2/9  Depression screen [facility] 2/9 02/01/2019 07/30/2018 06/21/2018 06/15/2018 09/09/2017 07/07/2017 11/18/2016  Decreased Interest 0 0 0 0 0 0 0  Down, Depressed, Hopeless 0 0 0 0 0 0 0  PHQ - 2 Score 0 0 0 0 0 0 0  Altered sleeping - 0 - - - - -  Tired, decreased energy - 0 - - - - -  Change in appetite - 0 - - - - -  Feeling bad or failure about yourself  - 0 - - - - -  Trouble concentrating - 0 - - - - -  Moving slowly or fidgety/restless - 0 - - - - -  Suicidal thoughts - 0 - - - - -  Difficult doing work/chores - Not difficult at all - - - - -     Review of Systems   Constitutional: Negative.   HENT: Negative.   Eyes: Negative.   Respiratory: Negative.   Cardiovascular: Negative.   Gastrointestinal: Negative.   Endocrine: Negative.   Genitourinary: Negative.  Musculoskeletal: Positive for gait problem.  Skin: Negative.   Allergic/Immunologic: Negative.   Hematological: Negative.   Psychiatric/Behavioral: Negative.   All other systems reviewed and are negative.      Objective:   Physical Exam Vitals signs and nursing note reviewed.  Constitutional:      Appearance: Normal appearance.  Neck:     Musculoskeletal: Normal range of motion and neck supple.  Cardiovascular:     Rate and Rhythm: Normal rate and regular rhythm.     Pulses: Normal pulses.     Heart sounds: Normal heart sounds.  Pulmonary:     Effort: Pulmonary effort is normal.     Breath sounds: Normal breath sounds.  Musculoskeletal:     Comments: Normal Muscle Bulk and Muscle Testing Reveals:  Upper Extremities: Full ROM and Muscle Strength 5/5 Lower Extremities : Full ROM and Muscle Strength 5/5 Arrived in wheelchair   Skin:    General: Skin is warm and dry.  Neurological:     Mental Status: He is alert and oriented to person, place, and time.  Psychiatric:        Mood and Affect: Mood normal.        Behavior: Behavior normal.           Assessment & Plan:  1. Right Middle Cerebral Artery Stroke: Continue Outpatient Therapy with [facility]. Has a scheduled appointment with Neurology. Continue current medication Regimen.  2. New Onset Atrial Fibrillation: Continue current medication regimen. F/U with Cardiology.  3. Hypertension: Continue current medication regimen. PCP Following.  OA of Right Great Toe: PCP Following. Continue to Monitor.   20 minutes of face to face patient care time was spent during this visit. All questions were encouraged and answered.  F/U with Dr. Letta Pate in 4-6 weeks

## 2019-06-26 ENCOUNTER — Encounter: Payer: Self-pay | Admitting: Registered Nurse

## 2019-06-27 ENCOUNTER — Ambulatory Visit: Payer: Medicare Other | Admitting: Physical Medicine & Rehabilitation

## 2019-07-08 ENCOUNTER — Telehealth: Payer: Self-pay | Admitting: *Deleted

## 2019-07-08 NOTE — Telephone Encounter (Signed)
Jose Castro from [facility] called for extensionof OT 1wk 2.  Approval given.

## 2019-07-11 DIAGNOSIS — I4891 Unspecified atrial fibrillation: Secondary | ICD-10-CM

## 2019-07-11 DIAGNOSIS — I63511 Cerebral infarction due to unspecified occlusion or stenosis of right middle cerebral artery: Secondary | ICD-10-CM

## 2019-07-11 DIAGNOSIS — N400 Enlarged prostate without lower urinary tract symptoms: Secondary | ICD-10-CM

## 2019-07-11 DIAGNOSIS — N183 Chronic kidney disease, stage 3 (moderate): Secondary | ICD-10-CM

## 2019-07-11 DIAGNOSIS — K219 Gastro-esophageal reflux disease without esophagitis: Secondary | ICD-10-CM

## 2019-07-11 DIAGNOSIS — I739 Peripheral vascular disease, unspecified: Secondary | ICD-10-CM

## 2019-07-11 DIAGNOSIS — K5790 Diverticulosis of intestine, part unspecified, without perforation or abscess without bleeding: Secondary | ICD-10-CM

## 2019-07-11 DIAGNOSIS — Z950 Presence of cardiac pacemaker: Secondary | ICD-10-CM

## 2019-07-11 DIAGNOSIS — M47816 Spondylosis without myelopathy or radiculopathy, lumbar region: Secondary | ICD-10-CM

## 2019-07-11 DIAGNOSIS — D689 Coagulation defect, unspecified: Secondary | ICD-10-CM

## 2019-07-11 DIAGNOSIS — I6529 Occlusion and stenosis of unspecified carotid artery: Secondary | ICD-10-CM

## 2019-07-11 DIAGNOSIS — I129 Hypertensive chronic kidney disease with stage 1 through stage 4 chronic kidney disease, or unspecified chronic kidney disease: Secondary | ICD-10-CM

## 2019-07-11 DIAGNOSIS — Z85828 Personal history of other malignant neoplasm of skin: Secondary | ICD-10-CM

## 2019-07-11 DIAGNOSIS — I4892 Unspecified atrial flutter: Secondary | ICD-10-CM

## 2019-07-11 DIAGNOSIS — I69322 Dysarthria following cerebral infarction: Secondary | ICD-10-CM

## 2019-07-11 DIAGNOSIS — I69391 Dysphagia following cerebral infarction: Secondary | ICD-10-CM

## 2019-07-11 DIAGNOSIS — Z515 Encounter for palliative care: Secondary | ICD-10-CM

## 2019-07-11 DIAGNOSIS — I700 Atherosclerosis of aorta: Secondary | ICD-10-CM

## 2019-07-11 DIAGNOSIS — K8020 Calculus of gallbladder without cholecystitis without obstruction: Secondary | ICD-10-CM

## 2019-07-11 DIAGNOSIS — I442 Atrioventricular block, complete: Secondary | ICD-10-CM

## 2019-07-11 DIAGNOSIS — I441 Atrioventricular block, second degree: Secondary | ICD-10-CM

## 2019-07-11 DIAGNOSIS — E785 Hyperlipidemia, unspecified: Secondary | ICD-10-CM

## 2019-07-11 DIAGNOSIS — R1310 Dysphagia, unspecified: Secondary | ICD-10-CM

## 2019-07-12 ENCOUNTER — Other Ambulatory Visit: Payer: Self-pay

## 2019-07-12 ENCOUNTER — Encounter: Payer: Self-pay | Admitting: Family Medicine

## 2019-07-12 ENCOUNTER — Encounter: Payer: Self-pay | Admitting: Neurology

## 2019-07-12 ENCOUNTER — Ambulatory Visit (INDEPENDENT_AMBULATORY_CARE_PROVIDER_SITE_OTHER): Payer: Medicare Other | Admitting: Neurology

## 2019-07-12 ENCOUNTER — Telehealth: Payer: Self-pay

## 2019-07-12 VITALS — BP 128/72 | HR 62 | Temp 97.1°F | Ht 70.0 in | Wt 165.0 lb

## 2019-07-12 DIAGNOSIS — I63131 Cerebral infarction due to embolism of right carotid artery: Secondary | ICD-10-CM | POA: Diagnosis not present

## 2019-07-12 NOTE — Telephone Encounter (Signed)
Wes PT [facility] [phone] called requesting verbal orders for physical therapy for 1xwk X 1wk followed by 2xwk X 3wks followed by 1xwk X 1wks.  Called him back and approved verbal orders.

## 2019-07-12 NOTE — Progress Notes (Signed)
[facility] [address] [place]. [place] [address] [phone]       OFFICE CONSULT NOTE  Mr. CATHERINE OAK Date of Birth:  12-Sep-1926 Medical Record Number:  [id number]   Referring MD: Stann Mainland, PA-C  Reason for Referral: Stroke HPI: Mr. Gantt is a pleasant 85 year old Caucasian male who is seen today for initial office consultation visit for stroke.  He is accompanied today by his daughter.  History is obtained from them and review of electronic medical records.  I personally reviewed imaging films in PACS.  He was admitted to [facility] on 05/21/2019 when he was found on the floor of his home.  He was last seen well on 1 PM on 05/20/2019 but the daughter.  She spoke to him at 7 PM and he was apparently fine.  Patient's granddaughter checked on him noticed he had right-sided gaze deviation with decrease sensation and strength.  EMS was called patient was found to be in new onset atrial flutter.  Head CT scan obtained showed hypodensity in the right MCA consistent with stroke.  NIH stroke scale was 6 he was not a candidate for TPA or IR as he was felt to be outside time window.  Chest x-ray on admission showed vascular congestion possible aspiration pneumonia.  Neurological exam on admission showed dysarthria with right gaze deviation but able to cross midline.  There is 4/5 left hemiparesis.  Echocardiogram showed normal ejection fraction.  LDL cholesterol was 1 1 5  mg percent.  Hemoglobin A1c was 5.8.  Carotid ultrasound showed no significant extracranial stenosis.  Patient was seen by speech therapy and diet was advanced.  He was subsequently transferred for inpatient rehab to [facility] where he spent 10 days and has subsequently been discharged home.  He is been getting home physical and occupational therapy.  He is able to walk with a walker.  His son feels left-sided sensory loss is improved but he still has some diminished fine motor skill and mild  weakness.  Patient was seen by ophthalmologist recently who felt he had no visual field loss.  He also saw urologist 3 weeks ago for possibly from prostate difficulties and is currently being treated for UTI.  Is also had significant right foot swelling and redness which was diagnosed with cellulitis.  He was seen by orthopedics.  He is finished a course of doxycycline and is currently taking Bactrim.  Patient does have a remote history of splenic infarct for which she was on anticoagulation for some time but this was discontinued several years ago.  He has no other prior history of strokes TIAs dementia seizures or significant neurological problems.  ROS:   14 system review of systems is positive for right foot pain and swelling, mild weakness, decreased hearing, all other systems negative PMH:  Past Medical History:  Diagnosis Date   Arthritis    BPH (benign prostatic hyperplasia)    Chronic kidney disease    had a kidney stone which per family was a cyst that was removed   Dysrhythmia    GERD (gastroesophageal reflux disease)    History of kidney stones    Hyperlipidemia    Hypertension    Splenic vein thrombosis    Stroke ([place]) 05/22/2019   Thrombosis 12/2015   mural  area and no notation of heart attack    Social History:  Social History   Socioeconomic History   Marital status: Widowed    Spouse name: Not on file  Number of children: 6   Years of education: Not on file   Highest education level: Some college, no degree  Occupational History   Occupation: retired    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 at [employer] insecurity    Worry: Never true    Inability: Never true   Transportation needs    Medical: No    Non-medical: No  Tobacco Use   Smoking status: Former Smoker    Years: 16.00    Types: Cigarettes    Start date: 10/21/1944    Quit date: 10/21/1960    Years since quitting:  58.7   Smokeless tobacco: Never Used  Substance and Sexual Activity   Alcohol use: Yes    Alcohol/week: 3.0 standard drinks    Types: 3 Standard drinks or equivalent per week    Comment: once a week when he goes out to dinner-socially   Drug use: No   Sexual activity: Yes    Partners: Female  Lifestyle   Physical activity    Days per week: 0 days    Minutes per session: 0 min   Stress: Not at all  Relationships   Social connections    Talks on phone: More than three times a week    Gets together: Twice a week    Attends religious service: More than 4 times per year    Active member of club or organization: Yes    Attends meetings of clubs or organizations: More than 4 times per year    Relationship status: Widowed   Intimate partner violence    Fear of current or ex partner: No    Emotionally abused: No    Physically abused: No    Forced sexual activity: No  Other Topics Concern   Not on file  Social History Narrative   Widow, has a good networks of friends and family     Medications:   Current Outpatient Medications on File Prior to Visit  Medication Sig Dispense Refill   acetaminophen (TYLENOL) 325 MG tablet Take 2 tablets (650 mg total) by mouth every 4 (four) hours as needed for mild pain (or temp > 37.5 C (99.5 F)).     apixaban (ELIQUIS) 2.5 MG TABS tablet Take 1 tablet (2.5 mg total) by mouth 2 (two) times daily. 60 tablet 0   Cholecalciferol (VITAMIN D3) 50 MCG (2000 UT) TABS Take 2,000 Units by mouth daily. 30 tablet 0   doxycycline (MONODOX) 100 MG capsule Take 100 mg by mouth 2 (two) times daily. 10 day rx     finasteride (PROSCAR) 5 MG tablet Take 1 tablet (5 mg total) by mouth daily. 30 tablet 0   metoprolol succinate (TOPROL XL) 25 MG 24 hr tablet Take 1 tablet (25 mg total) by mouth daily. 30 tablet 0   Omega-3 Fatty Acids (FISH OIL) 1000 MG CAPS Take 1,000 mg by mouth daily.      pantoprazole (PROTONIX) 40 MG tablet Take 1 tablet (40 mg  total) by mouth daily. 90 tablet 1   rosuvastatin (CRESTOR) 40 MG tablet Take 1 tablet (40 mg total) by mouth daily at 6 PM. 30 tablet 0   tamsulosin (FLOMAX) 0.4 MG CAPS capsule      No current facility-administered medications on file prior to visit.     Allergies:   Allergies  Allergen Reactions   Penicillins Itching and Swelling  Has patient had a PCN reaction causing immediate rash, facial/tongue/throat swelling, SOB or lightheadedness with hypotension: No Has patient had a PCN reaction causing severe rash involving mucus membranes or skin necrosis: No Has patient had a PCN reaction that required hospitalization: No Has patient had a PCN reaction occurring within the last 10 years: No If all of the above answers are "NO", then may proceed with Cephalosporin use.     Physical Exam General: Mildly obese pleasant elderly Caucasian male seated, in no evident distress Head: head normocephalic and atraumatic.   Neck: supple with no carotid or supraclavicular bruits Cardiovascular: regular rate and rhythm, soft ejection systolic murmur over precordium and neck Musculoskeletal: no deformity Skin:  no rash/petichiae.  He has mild cellulitis on the right foot and is wearing a boot. Vascular:  Normal pulses all extremities except in the right lower extremities where not very due to both  Neurologic Exam Mental Status: Awake and fully alert. Oriented to place and time. Recent and remote memory intact. Attention span, concentration and fund of knowledge appropriate. Mood and affect appropriate.  Cranial Nerves: Fundoscopic exam reveals sharp disc margins. Pupils equal, briskly reactive to light. Extraocular movements full without nystagmus. Visual fields full to confrontation. Hearing diminished bilaterally.. Facial sensation intact. Face, tongue, palate moves normally and symmetrically.  Motor: Normal bulk and tone.  Mild left hemiparesis 4+/5 strength.  No drift.  Mild weakness of left  grip and intrinsic hand muscles.  Diminished fine finger movements on the left.  Orbits right over left upper extremity.  Mild weakness of left ankle dorsiflexors and hip flexors only.. Sensory.: intact to touch , pinprick , position and vibratory sensation.  Coordination: Rapid alternating movements normal in all extremities. Finger-to-nose and heel-to-shin performed mildly impaired on the left.. Gait and Station: Arises from chair without difficulty. Stance is normal. Gait demonstrates mild dragging of the left foot.  And slight favoring of the right foot due to pain and boot. unable to do tandem walking. Reflexes: 1+ and symmetric. Toes downgoing.   NIHSS  2 Modified Rankin  2  ASSESSMENT: 85 year old Caucasian male with embolic right posterior temporal infarct in June 2020 secondary to new onset atrial flutter.  Remote history of splenic infarct and vascular risk factors of hypertension hyperlipidemia and atrial flutter.  He is doing remarkably well from his stroke     PLAN: I had a long d/w patient and his daughter about his recent stroke,atrial flutter risk for recurrent stroke/TIAs, personally independently reviewed imaging studies and stroke evaluation results and answered questions.Continue Eliquis (apixaban) 2.5 mg twice daily  for secondary stroke prevention and maintain strict control of hypertension with blood pressure goal below 130/90, diabetes with hemoglobin A1c goal below 6.5% and lipids with LDL cholesterol goal below 70 mg/dL. I also advised the patient to eat a healthy diet with plenty of whole grains, cereals, fruits and vegetables, exercise regularly and maintain ideal body weight. He was advised to use his walker at all times and continue home physical and occupational therapy.  Greater than 50% time during this 45-minute consultation visit was spent on counseling and coordination of care about embolic stroke and atrial flutter and answering questions.  Followup in the future  with  my nurse practitioner Janett Billow in 3 months or call earlier if necessary. Antony Contras, MD Medical Director [facility] Pager: [phone] 07/12/2019 6:21 PM Note: This document was prepared with digital dictation and possible smart phrase technology. Any transcriptional errors that result from this  process are unintentional.

## 2019-07-12 NOTE — Patient Instructions (Signed)
I had a long d/w patient and his daughter about his recent stroke,atrial flutter risk for recurrent stroke/TIAs, personally independently reviewed imaging studies and stroke evaluation results and answered questions.Continue Eliquis (apixaban) 2.5 mg twice daily  for secondary stroke prevention and maintain strict control of hypertension with blood pressure goal below 130/90, diabetes with hemoglobin A1c goal below 6.5% and lipids with LDL cholesterol goal below 70 mg/dL. I also advised the patient to eat a healthy diet with plenty of whole grains, cereals, fruits and vegetables, exercise regularly and maintain ideal body weight. He was advised to use his walker at all times and continue home physical and occupational therapy. Followup in the future with  My nurse practitioner Janett Billow in 3 months or call earlier if necessary.  Stroke Prevention Some medical conditions and behaviors are associated with a higher chance of having a stroke. You can help prevent a stroke by making nutrition, lifestyle, and other changes, including managing any medical conditions you may have. What nutrition changes can be made?   Eat healthy foods. You can do this by: ? Choosing foods high in fiber, such as fresh fruits and vegetables and whole grains. ? Eating at least 5 or more servings of fruits and vegetables a day. Try to fill half of your plate at each meal with fruits and vegetables. ? Choosing lean protein foods, such as lean cuts of meat, poultry without skin, fish, tofu, beans, and nuts. ? Eating low-fat dairy products. ? Avoiding foods that are high in salt (sodium). This can help lower blood pressure. ? Avoiding foods that have saturated fat, trans fat, and cholesterol. This can help prevent high cholesterol. ? Avoiding processed and premade foods.  Follow your health care provider's specific guidelines for losing weight, controlling high blood pressure (hypertension), lowering high cholesterol, and managing  diabetes. These may include: ? Reducing your daily calorie intake. ? Limiting your daily sodium intake to 1,500 milligrams (mg). ? Using only healthy fats for cooking, such as olive oil, canola oil, or sunflower oil. ? Counting your daily carbohydrate intake. What lifestyle changes can be made?  Maintain a healthy weight. Talk to your health care provider about your ideal weight.  Get at least 30 minutes of moderate physical activity at least 5 days a week. Moderate activity includes brisk walking, biking, and swimming.  Do not use any products that contain nicotine or tobacco, such as cigarettes and e-cigarettes. If you need help quitting, ask your health care provider. It may also be helpful to avoid exposure to secondhand smoke.  Limit alcohol intake to no more than 1 drink a day for nonpregnant women and 2 drinks a day for men. One drink equals 12 oz of beer, 5 oz of wine, or 1 oz of hard liquor.  Stop any illegal drug use.  Avoid taking birth control pills. Talk to your health care provider about the risks of taking birth control pills if: ? You are over 7 years old. ? You smoke. ? You get migraines. ? You have ever had a blood clot. What other changes can be made?  Manage your cholesterol levels. ? Eating a healthy diet is important for preventing high cholesterol. If cholesterol cannot be managed through diet alone, you may also need to take medicines. ? Take any prescribed medicines to control your cholesterol as told by your health care provider.  Manage your diabetes. ? Eating a healthy diet and exercising regularly are important parts of managing your blood sugar. If your blood  sugar cannot be managed through diet and exercise, you may need to take medicines. ? Take any prescribed medicines to control your diabetes as told by your health care provider.  Control your hypertension. ? To reduce your risk of stroke, try to keep your blood pressure below 130/80. ? Eating a  healthy diet and exercising regularly are an important part of controlling your blood pressure. If your blood pressure cannot be managed through diet and exercise, you may need to take medicines. ? Take any prescribed medicines to control hypertension as told by your health care provider. ? Ask your health care provider if you should monitor your blood pressure at home. ? Have your blood pressure checked every year, even if your blood pressure is normal. Blood pressure increases with age and some medical conditions.  Get evaluated for sleep disorders (sleep apnea). Talk to your health care provider about getting a sleep evaluation if you snore a lot or have excessive sleepiness.  Take over-the-counter and prescription medicines only as told by your health care provider. Aspirin or blood thinners (antiplatelets or anticoagulants) may be recommended to reduce your risk of forming blood clots that can lead to stroke.  Make sure that any other medical conditions you have, such as atrial fibrillation or atherosclerosis, are managed. What are the warning signs of a stroke? The warning signs of a stroke can be easily remembered as BEFAST.  B is for balance. Signs include: ? Dizziness. ? Loss of balance or coordination. ? Sudden trouble walking.  E is for eyes. Signs include: ? A sudden change in vision. ? Trouble seeing.  F is for face. Signs include: ? Sudden weakness or numbness of the face. ? The face or eyelid drooping to one side.  A is for arms. Signs include: ? Sudden weakness or numbness of the arm, usually on one side of the body.  S is for speech. Signs include: ? Trouble speaking (aphasia). ? Trouble understanding.  T is for time. ? These symptoms may represent a serious problem that is an emergency. Do not wait to see if the symptoms will go away. Get medical help right away. Call your local emergency services (911 in the [place] Do not drive yourself to the hospital.  Other  signs of stroke may include: ? A sudden, severe headache with no known cause. ? Nausea or vomiting. ? Seizure. Where to find more information For more information, visit:  American Stroke Association: www.strokeassociation.org  National Stroke Association: www.stroke.org Summary  You can prevent a stroke by eating healthy, exercising, not smoking, limiting alcohol intake, and managing any medical conditions you may have.  Do not use any products that contain nicotine or tobacco, such as cigarettes and e-cigarettes. If you need help quitting, ask your health care provider. It may also be helpful to avoid exposure to secondhand smoke.  Remember BEFAST for warning signs of stroke. Get help right away if you or a loved one has any of these signs. This information is not intended to replace advice given to you by your health care provider. Make sure you discuss any questions you have with your health care provider. Document Released: 12/25/2004 Document Revised: 10/30/2017 Document Reviewed: 12/23/2016 Elsevier Patient Education  2020 [employer].

## 2019-07-21 DIAGNOSIS — I739 Peripheral vascular disease, unspecified: Secondary | ICD-10-CM

## 2019-07-21 DIAGNOSIS — Z515 Encounter for palliative care: Secondary | ICD-10-CM

## 2019-07-21 DIAGNOSIS — I700 Atherosclerosis of aorta: Secondary | ICD-10-CM

## 2019-07-21 DIAGNOSIS — I6529 Occlusion and stenosis of unspecified carotid artery: Secondary | ICD-10-CM

## 2019-07-21 DIAGNOSIS — N400 Enlarged prostate without lower urinary tract symptoms: Secondary | ICD-10-CM

## 2019-07-21 DIAGNOSIS — K8020 Calculus of gallbladder without cholecystitis without obstruction: Secondary | ICD-10-CM

## 2019-07-21 DIAGNOSIS — I69391 Dysphagia following cerebral infarction: Secondary | ICD-10-CM

## 2019-07-21 DIAGNOSIS — Z85828 Personal history of other malignant neoplasm of skin: Secondary | ICD-10-CM

## 2019-07-21 DIAGNOSIS — Z950 Presence of cardiac pacemaker: Secondary | ICD-10-CM

## 2019-07-21 DIAGNOSIS — K219 Gastro-esophageal reflux disease without esophagitis: Secondary | ICD-10-CM

## 2019-07-21 DIAGNOSIS — E785 Hyperlipidemia, unspecified: Secondary | ICD-10-CM

## 2019-07-21 DIAGNOSIS — R1310 Dysphagia, unspecified: Secondary | ICD-10-CM | POA: Diagnosis not present

## 2019-07-21 DIAGNOSIS — I4891 Unspecified atrial fibrillation: Secondary | ICD-10-CM

## 2019-07-21 DIAGNOSIS — I129 Hypertensive chronic kidney disease with stage 1 through stage 4 chronic kidney disease, or unspecified chronic kidney disease: Secondary | ICD-10-CM

## 2019-07-21 DIAGNOSIS — I4892 Unspecified atrial flutter: Secondary | ICD-10-CM

## 2019-07-21 DIAGNOSIS — I69322 Dysarthria following cerebral infarction: Secondary | ICD-10-CM | POA: Diagnosis not present

## 2019-07-21 DIAGNOSIS — K5790 Diverticulosis of intestine, part unspecified, without perforation or abscess without bleeding: Secondary | ICD-10-CM

## 2019-07-21 DIAGNOSIS — M47816 Spondylosis without myelopathy or radiculopathy, lumbar region: Secondary | ICD-10-CM

## 2019-07-21 DIAGNOSIS — I63511 Cerebral infarction due to unspecified occlusion or stenosis of right middle cerebral artery: Secondary | ICD-10-CM

## 2019-07-21 DIAGNOSIS — D689 Coagulation defect, unspecified: Secondary | ICD-10-CM

## 2019-07-21 DIAGNOSIS — I441 Atrioventricular block, second degree: Secondary | ICD-10-CM

## 2019-07-21 DIAGNOSIS — N183 Chronic kidney disease, stage 3 (moderate): Secondary | ICD-10-CM

## 2019-07-21 DIAGNOSIS — I442 Atrioventricular block, complete: Secondary | ICD-10-CM | POA: Diagnosis not present

## 2019-07-28 ENCOUNTER — Other Ambulatory Visit: Payer: Self-pay

## 2019-07-28 ENCOUNTER — Encounter: Payer: Medicare Other | Attending: Registered Nurse | Admitting: Physical Medicine & Rehabilitation

## 2019-07-28 ENCOUNTER — Encounter: Payer: Self-pay | Admitting: Physical Medicine & Rehabilitation

## 2019-07-28 VITALS — BP 136/76 | HR 60 | Temp 98.3°F | Ht 67.5 in | Wt 165.0 lb

## 2019-07-28 DIAGNOSIS — R269 Unspecified abnormalities of gait and mobility: Secondary | ICD-10-CM

## 2019-07-28 DIAGNOSIS — I69398 Other sequelae of cerebral infarction: Secondary | ICD-10-CM | POA: Diagnosis not present

## 2019-07-28 DIAGNOSIS — I4891 Unspecified atrial fibrillation: Secondary | ICD-10-CM | POA: Diagnosis present

## 2019-07-28 DIAGNOSIS — I10 Essential (primary) hypertension: Secondary | ICD-10-CM | POA: Diagnosis present

## 2019-07-28 DIAGNOSIS — I63511 Cerebral infarction due to unspecified occlusion or stenosis of right middle cerebral artery: Secondary | ICD-10-CM | POA: Insufficient documentation

## 2019-07-28 DIAGNOSIS — M19071 Primary osteoarthritis, right ankle and foot: Secondary | ICD-10-CM | POA: Insufficient documentation

## 2019-07-28 DIAGNOSIS — R414 Neurologic neglect syndrome: Secondary | ICD-10-CM | POA: Diagnosis not present

## 2019-07-28 NOTE — Progress Notes (Signed)
Subjective:    Patient ID: Jose Castro, male    DOB: December 13, 1925, 85 y.o.   MRN: [id number]  HPI 85 year old male with history of hypertension, CKD 2, heart block status post pacemaker placement who was found down on 05/22/2019.  Was taken to the emergency department where further work-up indicated acute nonhemorrhagic right hemispheric infarct.  MRI not completed due to permanent pacemaker.  Patient had episode of atrial fibrillation during hospitalization and was seen by cardiology.  Changed to Eliquis on 625/2020.  Inpatient rehab from 05/26/2021 06/08/2019 The patient initially required moderate assistance for mobility and was discharged at a supervision level of assistance. The patient continues have poor safety awareness and is trying to ambulate to the bathroom at night by himself.  He usually uses the walker at home but sometimes tries to ambulate without it.  According to his daughter he is quite stubborn about this. The patient has had no recent falls. His family still provides 24/7 supervision.  Pain Inventory Average Pain 4 Pain Right Now 2 My pain is intermittent  In the last 24 hours, has pain interfered with the following? General activity 2 Relation with others 0 Enjoyment of life 2 What TIME of day is your pain at its worst? . Sleep (in general) Good  Pain is worse with: walking Pain improves with: medication Relief from Meds: 5  Mobility walk with assistance use a walker ability to climb steps?  yes do you drive?  no  Function retired I need assistance with the following:  household duties and shopping  Neuro/Psych trouble walking  Prior Studies Any changes since last visit?  no  Physicians involved in your care Any changes since last visit?  no   Family History  Problem Relation Age of Onset  . Cancer Brother        bladder cancer with mets  . Heart disease Mother   . Aortic aneurysm Mother   . Heart attack Maternal Aunt   . Heart attack  Maternal Uncle    Social History   Socioeconomic History  . Marital status: Widowed    Spouse name: Not on file  . Number of children: 6  . Years of education: Not on file  . Highest education level: Some college, no degree  Occupational History  . Occupation: retired    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  Social Needs  . Financial resource strain: Not hard at all  . Food insecurity    Worry: Never true    Inability: Never true  . Transportation needs    Medical: No    Non-medical: No  Tobacco Use  . Smoking status: Former Smoker    Years: 16.00    Types: Cigarettes    Start date: 10/21/1944    Quit date: 10/21/1960    Years since quitting: 58.8  . Smokeless tobacco: Never Used  Substance and Sexual Activity  . Alcohol use: Yes    Alcohol/week: 3.0 standard drinks    Types: 3 Standard drinks or equivalent per week    Comment: once a week when he goes out to dinner-socially  . Drug use: No  . Sexual activity: Yes    Partners: Female  Lifestyle  . Physical activity    Days per week: 0 days    Minutes per session: 0 min  . Stress: Not at all  Relationships  . Social connections    Talks on phone: More than three times a week  Gets together: Twice a week    Attends religious service: More than 4 times per year    Active member of club or organization: Yes    Attends meetings of clubs or organizations: More than 4 times per year    Relationship status: Widowed  Other Topics Concern  . Not on file  Social History Narrative   Widow, has a good networks of friends and family    Past Surgical History:  Procedure Laterality Date  . APPENDECTOMY    . CATARACT EXTRACTION, BILATERAL    . CYSTOSCOPY WITH INSERTION OF UROLIFT    . KIDNEY STONE SURGERY    . KNEE SURGERY    . PACEMAKER INSERTION N/A 07/13/2018   Procedure: INSERTION PACEMAKER-DUEL CHAMBER INITIAL IMPLANT;  Surgeon: Isaias Cowman, MD;  Location: [facility] ORS;  Service: Cardiovascular;   Laterality: N/A;  . SPINE SURGERY     Past Medical History:  Diagnosis Date  . Arthritis   . BPH (benign prostatic hyperplasia)   . Chronic kidney disease    had a kidney stone which per family was a cyst that was removed  . Dysrhythmia   . GERD (gastroesophageal reflux disease)   . History of kidney stones   . Hyperlipidemia   . Hypertension   . Splenic vein thrombosis   . Stroke ([place]) 05/22/2019  . Thrombosis 12/2015   mural  area and no notation of heart attack   There were no vitals taken for this visit.  Opioid Risk Score:   Fall Risk Score:  `1  Depression screen PHQ 2/9  Depression screen [facility] 2/9 06/22/2019 02/01/2019 07/30/2018 06/21/2018 06/15/2018 09/09/2017 07/07/2017  Decreased Interest 0 0 0 0 0 0 0  Down, Depressed, Hopeless 0 0 0 0 0 0 0  PHQ - 2 Score 0 0 0 0 0 0 0  Altered sleeping 1 - 0 - - - -  Tired, decreased energy 0 - 0 - - - -  Change in appetite 0 - 0 - - - -  Feeling bad or failure about yourself  0 - 0 - - - -  Trouble concentrating 0 - 0 - - - -  Moving slowly or fidgety/restless 0 - 0 - - - -  Suicidal thoughts 0 - 0 - - - -  PHQ-9 Score 1 - - - - - -  Difficult doing work/chores Not difficult at all - Not difficult at all - - - -     Review of Systems  Constitutional: Negative.   HENT: Negative.   Eyes: Negative.   Respiratory: Negative.   Cardiovascular: Negative.   Gastrointestinal: Negative.   Endocrine: Negative.   Genitourinary: Negative.   Musculoskeletal: Positive for gait problem.  Skin: Negative.   Allergic/Immunologic: Negative.   Hematological: Negative.   Psychiatric/Behavioral: Negative.   All other systems reviewed and are negative.      Objective:   Physical Exam Vitals signs and nursing note reviewed.  Constitutional:      Appearance: Normal appearance.  Eyes:     General: No visual field deficit.    Extraocular Movements: Extraocular movements intact.     Conjunctiva/sclera: Conjunctivae normal.     Pupils:  Pupils are equal, round, and reactive to light.  Neurological:     Mental Status: He is alert and oriented to person, place, and time.     Cranial Nerves: No dysarthria.     Sensory: No sensory deficit.     Motor: No weakness.  Coordination: Romberg sign positive. Coordination normal. Finger-Nose-Finger Test normal. Rapid alternating movements normal.     Gait: Gait abnormal and tandem walk abnormal.     Comments: Left neglect noted on confrontation testing no evidence of homonymous hemianopsia  Motor strength is 5/5 bilateral deltoid bicep tricep grip as well as hip flexor knee extensor ankle dorsiflexor  Increased base support during ambulation.  Uses a rolling walker.  Psychiatric:        Mood and Affect: Mood normal.        Behavior: Behavior normal.           Assessment & Plan:  #1.  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  He is now on Eliquis.  His residual deficits include poor balance as well as left neglect.  According to his daughter he is no longer walking into door frames on the left side. He continues require 24/7 supervision for safety. We discussed with the patient with his daughter in the room that is very important that he waits for somebody to get up.  The patient argues that he can get up okay without help.  We discussed that a fall may result in injury that requires skilled nursing facility placement  Physical medicine rehabilitation follow-up in 6 weeks

## 2019-07-28 NOTE — Patient Instructions (Signed)
May need Outpatient therapy after home health   Needs 24/7 supervision

## 2019-07-29 ENCOUNTER — Ambulatory Visit (INDEPENDENT_AMBULATORY_CARE_PROVIDER_SITE_OTHER): Payer: Medicare Other | Admitting: Podiatry

## 2019-07-29 ENCOUNTER — Encounter: Payer: Self-pay | Admitting: Podiatry

## 2019-07-29 DIAGNOSIS — M10071 Idiopathic gout, right ankle and foot: Secondary | ICD-10-CM

## 2019-07-29 DIAGNOSIS — M19071 Primary osteoarthritis, right ankle and foot: Secondary | ICD-10-CM | POA: Diagnosis not present

## 2019-07-29 DIAGNOSIS — M7751 Other enthesopathy of right foot: Secondary | ICD-10-CM | POA: Diagnosis not present

## 2019-07-29 MED ORDER — COLCHICINE 0.6 MG PO TABS: 0.6000 mg | ORAL_TABLET | Freq: Every day | ORAL | 0 refills | Status: DC

## 2019-07-30 LAB — URIC ACID: Uric Acid: 3.4 mg/dL — ABNORMAL LOW (ref 3.7–8.6)

## 2019-08-01 ENCOUNTER — Other Ambulatory Visit (HOSPITAL_COMMUNITY): Payer: Self-pay | Admitting: Family Medicine

## 2019-08-01 ENCOUNTER — Other Ambulatory Visit: Payer: Self-pay | Admitting: Family Medicine

## 2019-08-01 DIAGNOSIS — M79671 Pain in right foot: Secondary | ICD-10-CM

## 2019-08-01 NOTE — Progress Notes (Signed)
   HPI: 85 y.o. male presenting today as a new patient with a chief complaint of aching, burning and sharp pain of the medial right foot that began two months ago. He reports associated swelling and redness of the area. He states his symptoms have been ongoing since his stroke on 05/22/2019. He was prescribed Bactrim and Doxycycline by his orthopedist for possible infection. Touching the area increases the pain. Patient is here for further evaluation and treatment.   Past Medical History:  Diagnosis Date  . Arthritis   . BPH (benign prostatic hyperplasia)   . Chronic kidney disease    had a kidney stone which per family was a cyst that was removed  . Dysrhythmia   . GERD (gastroesophageal reflux disease)   . History of kidney stones   . Hyperlipidemia   . Hypertension   . Splenic vein thrombosis   . Stroke ([place]) 05/22/2019  . Thrombosis 12/2015   mural  area and no notation of heart attack     Physical Exam: General: The patient is alert and oriented x3 in no acute distress.  Dermatology: Skin is warm, dry and supple bilateral lower extremities. Negative for open lesions or macerations.  Vascular: Palpable pedal pulses bilaterally. No edema or erythema noted. Capillary refill within normal limits.  Neurological: Epicritic and protective threshold grossly intact bilaterally.   Musculoskeletal Exam: Pain on palpation to the right 1st MPJ with erythema and edema. Range of motion within normal limits to all pedal and ankle joints bilateral. Muscle strength 5/5 in all groups bilateral.    Assessment: 1. Acute gout / 1st MPJ capsulitis right    Plan of Care:  1. Patient evaluated. X-Rays in Epic reviewed.  2. Injection of 0.5 mLs Celestone Soluspan injected into the 1st MPJ of the right foot.  3. Prescription for Colcrys 0.6 mg #7 provided to patient.  4. Continue using post op shoe for 1 week.  5. Uric acid levels taken.  6. Return to clinic in 4 weeks.       Edrick Kins,  DPM [facility]  Dr. Edrick Kins, DPM    [address], [place] [address]                Office [phone]  Fax [phone]

## 2019-08-09 ENCOUNTER — Telehealth: Payer: Self-pay

## 2019-08-09 NOTE — Telephone Encounter (Signed)
Wes, PT from [facility] called requesting extension on HHPT 1wk1, 2wk3. Orders approved and given.

## 2019-08-16 ENCOUNTER — Encounter (HOSPITAL_COMMUNITY): Payer: Self-pay

## 2019-08-16 ENCOUNTER — Ambulatory Visit (HOSPITAL_COMMUNITY): Payer: Medicare Other

## 2019-08-30 ENCOUNTER — Other Ambulatory Visit: Payer: Self-pay

## 2019-08-30 ENCOUNTER — Encounter: Payer: Self-pay | Admitting: Podiatry

## 2019-08-30 ENCOUNTER — Ambulatory Visit (INDEPENDENT_AMBULATORY_CARE_PROVIDER_SITE_OTHER): Payer: Medicare Other | Admitting: Podiatry

## 2019-08-30 DIAGNOSIS — M10071 Idiopathic gout, right ankle and foot: Secondary | ICD-10-CM | POA: Diagnosis not present

## 2019-08-30 DIAGNOSIS — M7751 Other enthesopathy of right foot: Secondary | ICD-10-CM | POA: Diagnosis not present

## 2019-08-30 DIAGNOSIS — M19071 Primary osteoarthritis, right ankle and foot: Secondary | ICD-10-CM | POA: Diagnosis not present

## 2019-08-30 NOTE — Progress Notes (Signed)
   HPI: 85 y.o. male presenting today for follow up evaluation of gout of the 1st MPJ of the right foot. He states he is doing much better and has improved unquestionably. He does reports some continued mild discomfort but states the swelling has decreased significantly. He states he has good coloring of the skin now. There are no worsening factors noted. He states the injection and the Colcrys has helped alleviate his symptoms. Patient is here for further evaluation and treatment.    Past Medical History:  Diagnosis Date  . Arthritis   . BPH (benign prostatic hyperplasia)   . Chronic kidney disease    had a kidney stone which per family was a cyst that was removed  . Dysrhythmia   . GERD (gastroesophageal reflux disease)   . History of kidney stones   . Hyperlipidemia   . Hypertension   . Splenic vein thrombosis   . Stroke ([place]) 05/22/2019  . Thrombosis 12/2015   mural  area and no notation of heart attack     Physical Exam: General: The patient is alert and oriented x3 in no acute distress.  Dermatology: Skin is warm, dry and supple bilateral lower extremities. Negative for open lesions or macerations.  Vascular: Palpable pedal pulses bilaterally. No edema or erythema noted. Capillary refill within normal limits.  Neurological: Epicritic and protective threshold grossly intact bilaterally.   Musculoskeletal Exam: Range of motion within normal limits to all pedal and ankle joints bilateral. Muscle strength 5/5 in all groups bilateral.    Assessment: 1. Acute gout / 1st MPJ capsulitis right - resolved    Plan of Care:  1. Patient evaluated.  2. Recommended good shoe gear.  3. Return to clinic as needed if he has another flare-up.        Edrick Kins, DPM [facility]  Dr. Edrick Kins, DPM    [address], [place] [address]                Office [phone]  Fax [phone]

## 2019-09-08 ENCOUNTER — Encounter: Payer: Medicare Other | Attending: Registered Nurse | Admitting: Physical Medicine & Rehabilitation

## 2019-09-08 ENCOUNTER — Other Ambulatory Visit: Payer: Self-pay

## 2019-09-08 ENCOUNTER — Encounter: Payer: Self-pay | Admitting: Physical Medicine & Rehabilitation

## 2019-09-08 VITALS — BP 119/75 | HR 68 | Temp 97.5°F | Resp 14 | Ht 70.5 in | Wt 171.0 lb

## 2019-09-08 DIAGNOSIS — I10 Essential (primary) hypertension: Secondary | ICD-10-CM | POA: Insufficient documentation

## 2019-09-08 DIAGNOSIS — I69398 Other sequelae of cerebral infarction: Secondary | ICD-10-CM

## 2019-09-08 DIAGNOSIS — I4891 Unspecified atrial fibrillation: Secondary | ICD-10-CM | POA: Diagnosis present

## 2019-09-08 DIAGNOSIS — M19071 Primary osteoarthritis, right ankle and foot: Secondary | ICD-10-CM | POA: Diagnosis present

## 2019-09-08 DIAGNOSIS — I69359 Hemiplegia and hemiparesis following cerebral infarction affecting unspecified side: Secondary | ICD-10-CM | POA: Diagnosis not present

## 2019-09-08 DIAGNOSIS — I63511 Cerebral infarction due to unspecified occlusion or stenosis of right middle cerebral artery: Secondary | ICD-10-CM | POA: Insufficient documentation

## 2019-09-08 DIAGNOSIS — R269 Unspecified abnormalities of gait and mobility: Secondary | ICD-10-CM | POA: Diagnosis not present

## 2019-09-08 NOTE — Progress Notes (Signed)
Subjective:    Patient ID: Jose Castro, male    DOB: 03/20/1926, 85 y.o.   MRN: [id number] 85 year old male with history of hypertension, CKD 2, heart block status post pacemaker placement who was found down on 05/22/2019.  Was taken to the emergency department where further work-up indicated acute nonhemorrhagic right hemispheric infarct.  MRI not completed due to permanent pacemaker.  Patient had episode of atrial fibrillation during hospitalization and was seen by cardiology.  Changed to Eliquis on 625/2020.  Inpatient rehab from 05/26/2021 06/08/2019 The patient initially required moderate assistance for mobility and was discharged at a supervision level of assistance. The patient continues have poor safety awareness and is trying to ambulate to the bathroom at night by himself.  He usually uses the walker at home but sometimes tries to ambulate without it.  According to his daughter he is quite stubborn about this. The patient has had no recent falls. His family still provides 24/7 supervision. HPI  HHOT x 4 wks Speech x 2 wks HHPT just finished   Right foot metatarsal capsulitis, had an injection  Hx of chronic knee OA  No falls recently, tripped ~5wks ago  Needs help with compression hose Showers mod I  Making sandwiches  Still using RW Pain Inventory Average Pain 0 Pain Right Now 0 My pain is no pain  In the last 24 hours, has pain interfered with the following? General activity 0 Relation with others 0 Enjoyment of life 0 What TIME of day is your pain at its worst? no pain Sleep (in general) Good  Pain is worse with: no pain Pain improves with: no pain Relief from Meds: no pain  Mobility walk with assistance use a walker  Function retired  Neuro/Psych trouble walking  Prior Studies Any changes since last visit?  no  Physicians involved in your care Any changes since last visit?  no   Family History  Problem Relation Age of Onset  . Cancer Brother         bladder cancer with mets  . Heart disease Mother   . Aortic aneurysm Mother   . Heart attack Maternal Aunt   . Heart attack Maternal Uncle    Social History   Socioeconomic History  . Marital status: Widowed    Spouse name: Not on file  . Number of children: 6  . Years of education: Not on file  . Highest education level: Some college, no degree  Occupational History  . Occupation: retired    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  Social Needs  . Financial resource strain: Not hard at all  . Food insecurity    Worry: Never true    Inability: Never true  . Transportation needs    Medical: No    Non-medical: No  Tobacco Use  . Smoking status: Former Smoker    Years: 16.00    Types: Cigarettes    Start date: 10/21/1944    Quit date: 10/21/1960    Years since quitting: 58.9  . Smokeless tobacco: Never Used  Substance and Sexual Activity  . Alcohol use: Yes    Alcohol/week: 3.0 standard drinks    Types: 3 Standard drinks or equivalent per week    Comment: once a week when he goes out to dinner-socially  . Drug use: No  . Sexual activity: Yes    Partners: Female  Lifestyle  . Physical activity    Days per week: 0 days  Minutes per session: 0 min  . Stress: Not at all  Relationships  . Social connections    Talks on phone: More than three times a week    Gets together: Twice a week    Attends religious service: More than 4 times per year    Active member of club or organization: Yes    Attends meetings of clubs or organizations: More than 4 times per year    Relationship status: Widowed  Other Topics Concern  . Not on file  Social History Narrative   Widow, has a good networks of friends and family    Past Surgical History:  Procedure Laterality Date  . APPENDECTOMY    . CATARACT EXTRACTION, BILATERAL    . CYSTOSCOPY WITH INSERTION OF UROLIFT    . KIDNEY STONE SURGERY    . KNEE SURGERY    . PACEMAKER INSERTION N/A 07/13/2018   Procedure:  INSERTION PACEMAKER-DUEL CHAMBER INITIAL IMPLANT;  Surgeon: Isaias Cowman, MD;  Location: [facility] ORS;  Service: Cardiovascular;  Laterality: N/A;  . SPINE SURGERY     Past Medical History:  Diagnosis Date  . Arthritis   . BPH (benign prostatic hyperplasia)   . Chronic kidney disease    had a kidney stone which per family was a cyst that was removed  . Dysrhythmia   . GERD (gastroesophageal reflux disease)   . History of kidney stones   . Hyperlipidemia   . Hypertension   . Splenic vein thrombosis   . Stroke ([place]) 05/22/2019  . Thrombosis 12/2015   mural  area and no notation of heart attack   BP 119/75   Pulse 68   Temp (!) 97.5 F (36.4 C)   Resp 14   Ht 5' 10.5" (1.791 m)   Wt 171 lb (77.6 kg)   SpO2 93%   BMI 24.19 kg/m   Opioid Risk Score:   Fall Risk Score:  `1  Depression screen PHQ 2/9  Depression screen [facility] 2/9 06/22/2019 02/01/2019 07/30/2018 06/21/2018 06/15/2018 09/09/2017 07/07/2017  Decreased Interest 0 0 0 0 0 0 0  Down, Depressed, Hopeless 0 0 0 0 0 0 0  PHQ - 2 Score 0 0 0 0 0 0 0  Altered sleeping 1 - 0 - - - -  Tired, decreased energy 0 - 0 - - - -  Change in appetite 0 - 0 - - - -  Feeling bad or failure about yourself  0 - 0 - - - -  Trouble concentrating 0 - 0 - - - -  Moving slowly or fidgety/restless 0 - 0 - - - -  Suicidal thoughts 0 - 0 - - - -  PHQ-9 Score 1 - - - - - -  Difficult doing work/chores Not difficult at all - Not difficult at all - - - -    Review of Systems  Constitutional: Negative.   HENT: Negative.   Eyes: Negative.   Respiratory: Negative.   Cardiovascular: Negative.   Gastrointestinal: Negative.   Endocrine: Negative.   Genitourinary: Negative.   Musculoskeletal: Positive for gait problem.  Skin: Negative.   Allergic/Immunologic: Negative.   Hematological: Negative.   Psychiatric/Behavioral: Negative.   All other systems reviewed and are negative.      Objective:   Physical Exam Constitutional:       Appearance: Normal appearance.  HENT:     Head: Normocephalic and atraumatic.  Eyes:     Extraocular Movements: Extraocular movements intact.  Conjunctiva/sclera: Conjunctivae normal.     Pupils: Pupils are equal, round, and reactive to light.  Neurological:     General: No focal deficit present.     Mental Status: He is alert and oriented to person, place, and time.  Psychiatric:        Mood and Affect: Mood normal.        Behavior: Behavior normal.    Strength is 4/5 in the left deltoid, bicep, tricep, grip 5/5 in bilateral hip flexor knee extensor ankle dorsiflexor 5/5 in the right deltoid, bicep, tricep, grip Standing balance is poor he is unable to stand with his feet together Romberg is positive as well  Sensation reported is equal bilateral upper and lower limbs His normal proprioception bilateral lower extremities. Gait Patient exhibits a steppage type gait no evidence of toe drag or knee instability. Uses a walker to ambulate.  Unable to walk without a walker.       Assessment & Plan:  1.  Right frontal infarct with left hemiparesis.  Overall making some progress.  He has a mild left upper extremity paresis as well as decreased fine motor and decreased rapid alternating movement. She he would benefit from outpatient OT 2.  Gait disorder secondary to CVA did not use a walker prior to his stroke.  Will make referral to outpatient PT. Physical medicine rehab follow-up in 6 weeks

## 2019-09-21 ENCOUNTER — Ambulatory Visit: Payer: Medicare Other | Attending: Physical Medicine & Rehabilitation

## 2019-09-21 ENCOUNTER — Ambulatory Visit: Payer: Medicare Other | Admitting: Occupational Therapy

## 2019-09-21 ENCOUNTER — Other Ambulatory Visit: Payer: Self-pay

## 2019-09-21 DIAGNOSIS — M6281 Muscle weakness (generalized): Secondary | ICD-10-CM

## 2019-09-21 DIAGNOSIS — R278 Other lack of coordination: Secondary | ICD-10-CM

## 2019-09-21 DIAGNOSIS — I129 Hypertensive chronic kidney disease with stage 1 through stage 4 chronic kidney disease, or unspecified chronic kidney disease: Secondary | ICD-10-CM

## 2019-09-21 DIAGNOSIS — K219 Gastro-esophageal reflux disease without esophagitis: Secondary | ICD-10-CM

## 2019-09-21 DIAGNOSIS — I441 Atrioventricular block, second degree: Secondary | ICD-10-CM

## 2019-09-21 DIAGNOSIS — K8020 Calculus of gallbladder without cholecystitis without obstruction: Secondary | ICD-10-CM

## 2019-09-21 DIAGNOSIS — R2681 Unsteadiness on feet: Secondary | ICD-10-CM | POA: Diagnosis present

## 2019-09-21 DIAGNOSIS — R1310 Dysphagia, unspecified: Secondary | ICD-10-CM

## 2019-09-21 DIAGNOSIS — R2689 Other abnormalities of gait and mobility: Secondary | ICD-10-CM | POA: Insufficient documentation

## 2019-09-21 DIAGNOSIS — M47816 Spondylosis without myelopathy or radiculopathy, lumbar region: Secondary | ICD-10-CM

## 2019-09-21 DIAGNOSIS — I442 Atrioventricular block, complete: Secondary | ICD-10-CM

## 2019-09-21 DIAGNOSIS — I6529 Occlusion and stenosis of unspecified carotid artery: Secondary | ICD-10-CM

## 2019-09-21 DIAGNOSIS — R29898 Other symptoms and signs involving the musculoskeletal system: Secondary | ICD-10-CM | POA: Diagnosis present

## 2019-09-21 DIAGNOSIS — E785 Hyperlipidemia, unspecified: Secondary | ICD-10-CM

## 2019-09-21 DIAGNOSIS — N400 Enlarged prostate without lower urinary tract symptoms: Secondary | ICD-10-CM

## 2019-09-21 DIAGNOSIS — K5790 Diverticulosis of intestine, part unspecified, without perforation or abscess without bleeding: Secondary | ICD-10-CM

## 2019-09-21 DIAGNOSIS — Z85828 Personal history of other malignant neoplasm of skin: Secondary | ICD-10-CM

## 2019-09-21 DIAGNOSIS — I700 Atherosclerosis of aorta: Secondary | ICD-10-CM

## 2019-09-21 DIAGNOSIS — I739 Peripheral vascular disease, unspecified: Secondary | ICD-10-CM

## 2019-09-21 DIAGNOSIS — I69391 Dysphagia following cerebral infarction: Secondary | ICD-10-CM

## 2019-09-21 DIAGNOSIS — N183 Chronic kidney disease, stage 3 unspecified: Secondary | ICD-10-CM

## 2019-09-21 DIAGNOSIS — I69322 Dysarthria following cerebral infarction: Secondary | ICD-10-CM

## 2019-09-21 DIAGNOSIS — I4892 Unspecified atrial flutter: Secondary | ICD-10-CM

## 2019-09-21 DIAGNOSIS — I4891 Unspecified atrial fibrillation: Secondary | ICD-10-CM

## 2019-09-21 DIAGNOSIS — Z950 Presence of cardiac pacemaker: Secondary | ICD-10-CM

## 2019-09-21 NOTE — Therapy (Signed)
[place] MAIN [facility] SERVICES [address] [place], [place], [address] Phone: [phone]   Fax:  [phone]  Physical Therapy Evaluation  Patient Details  Name: Jose Castro MRN: [id number] Date of Birth: 01/17/1926 Referring Provider (PT): Dr. Letta Pate   Encounter Date: 09/21/2019  PT End of Session - 09/23/19 1014    Visit Number  1    Number of Visits  25    Date for PT Re-Evaluation  12/14/19    PT Start Time  1300    PT Stop Time  1400    PT Time Calculation (min)  60 min    Equipment Utilized During Treatment  Gait belt    Activity Tolerance  Patient tolerated treatment well    Behavior During Therapy  [facility] for tasks assessed/performed;Impulsive       Past Medical History:  Diagnosis Date  . Arthritis   . BPH (benign prostatic hyperplasia)   . Chronic kidney disease    had a kidney stone which per family was a cyst that was removed  . Dysrhythmia   . GERD (gastroesophageal reflux disease)   . History of kidney stones   . Hyperlipidemia   . Hypertension   . Splenic vein thrombosis   . Stroke ([place]) 05/22/2019  . Thrombosis 12/2015   mural  area and no notation of heart attack    Past Surgical History:  Procedure Laterality Date  . APPENDECTOMY    . CATARACT EXTRACTION, BILATERAL    . CYSTOSCOPY WITH INSERTION OF UROLIFT    . KIDNEY STONE SURGERY    . KNEE SURGERY    . PACEMAKER INSERTION N/A 07/13/2018   Procedure: INSERTION PACEMAKER-DUEL CHAMBER INITIAL IMPLANT;  Surgeon: Isaias Cowman, MD;  Location: [facility] ORS;  Service: Cardiovascular;  Laterality: N/A;  . SPINE SURGERY      There were no vitals filed for this visit.   Subjective Assessment - 09/23/19 1116    Subjective  Pt presents today for evaluation of unsteadiness and difficulty with gait    Patient is accompained by:  Family member    Pertinent History  Pt is a 85 year old male who presents with imbalance and difficulty with gait following a  right frontal CVA with left hemiparesis on 05/22/19.  He has completed inpatient and home health PT, and now presents for OP PT.  He was discharged from inpatient rehab at a supervision level of assistance, ambulating with a RW.  His family notes that he is impulsive and will try to ambulate without his RW at home.  Family is currently providing 24/7 supervision.    Limitations  House hold activities;Walking    How long can you sit comfortably?  no limitations    How long can you walk comfortably?  he requires a RW at home    Patient Stated Goals  get back to normal; want to be able to play golf and go out to play trivia again.  His daughters want him to regain independence with going to the bathroom at night    Currently in Pain?  No/denies         [facility] PT Assessment - 09/23/19 1109      Assessment   Medical Diagnosis  CVA, abnormalities in gait, unsteadiness    Referring Provider (PT)  Dr. Letta Pate    Onset Date/Surgical Date  05/21/19    Hand Dominance  Right    Next MD Visit  November    Prior Therapy  Inpatient and  HH       Precautions   Precautions  Fall      Restrictions   Weight Bearing Restrictions  No      Balance Screen   Has the patient fallen in the past 6 months  Yes    How many times?  1    Has the patient had a decrease in activity level because of a fear of falling?   Yes    Is the patient reluctant to leave their home because of a fear of falling?   Yes      [place] residence    Living Arrangements  Children   rotating 25/7 care from children (4)   Available Help at Discharge  Family;Available 24 hours/day    Type of Home  House    Home Access  Stairs to enter    Entrance Stairs-Number of Steps  2    Entrance Stairs-Rails  Right;Left;Can reach both    Home Layout  Two level;1/2 bath on main level;Bed/bath upstairs    Alternate Level Stairs-Number of Steps  13    Alternate Level Stairs-Rails  Right;Left;Can reach both     Home Equipment  Walker - 2 wheels;Bedside commode;Shower seat;Grab bars - tub/shower;Tub bench;Wheelchair - manual      Prior Function   Level of Independence  Independent    Vocation  Retired   worked at [employer], trivia      Cognition   Overall Cognitive Status  Within [employer] for tasks assessed    Attention  Divided    Memory  Appears intact      Sensation   Light Touch  Appears Intact      Standardized Balance Assessment   Standardized Balance Assessment  Berg Balance Test;Timed Up and Go Test;10 meter walk test;Five Times Sit to Stand    Five times sit to stand comments   32.28 seconds    10 Meter Walk  Self-selected: 20.9 s = 0.48 m/s; Fastest: 17.1s =0.58 m/s      Berg Balance Test   Sit to Stand  Able to stand using hands after several tries    Standing Unsupported  Able to stand safely 2 minutes    Sitting with Back Unsupported but Feet Supported on Floor or Stool  Able to sit safely and securely 2 minutes    Stand to Sit  Sits safely with minimal use of hands    Transfers  Able to transfer safely, minor use of hands    Standing Unsupported with Eyes Closed  Able to stand 10 seconds safely    Standing Unsupported with Feet Together  Able to place feet together independently and stand 1 minute safely    From Standing, Reach Forward with Outstretched Arm  Can reach confidently >25 cm (10")    From Standing Position, Pick up Object from Floor  Able to pick up shoe, needs supervision    From Standing Position, Turn to Look Behind Over each Shoulder  Looks behind one side only/other side shows less weight shift    Turn 360 Degrees  Able to turn 360 degrees safely but slowly    Standing Unsupported, Alternately Place Feet on Step/Stool  Able to complete >2 steps/needs minimal assist    Standing Unsupported, One Foot in Front  Able to take small step independently and hold 30 seconds    Standing on One Leg  Tries to lift leg/unable to hold  3 seconds but  remains standing independently    Total Score  42      Timed Up and Go Test   TUG  Normal TUG    Normal TUG (seconds)  22.19          Objective measurements completed on examination: See above findings.        SUBJECTIVE Chief complaint: Pt is a 85 year old male who presents with imbalance and difficulty with gait following a right frontal CVA with left hemiparesis on 05/22/19.  He has completed inpatient and home health PT, and now presents for OP PT.  He was discharged from inpatient rehab at a supervision level of assistance, ambulating with a RW.  His family notes that he is impulsive and will try to ambulate without his RW at home.  Family is currently providing 24/7 supervision.  His daughter's state that he presents with unsteadiness after standing for over 20 min during ADLs.  Onset: 05/22/19 Imaging: None Recent changes in overall health/medication: No Directional pattern for falls: None Prior history of physical therapy for balance: None Follow-up appointment with MD: None scheduled Red flags (bowel/bladder changes, saddle paresthesia, personal history of cancer, chills/fever, night sweats, unrelenting pain) Negative  OBJECTIVE  MUSCULOSKELETAL: Tremor: Absent Bulk: Normal Tone: Normal, no clonus  Posture No gross abnormalities noted in standing or seated posture  Gait   Strength R/L 4+/4+ Hip flexion 5/5 Hip external rotation 5/5 Hip internal rotation 4/4 Hip extension  4/4 Hip abduction 5/5 Hip adduction 5/5 Knee extension 4+/4+ Knee flexion 5/5 Ankle Plantarflexion 5/5 Ankle Dorsiflexion   NEUROLOGICAL:  Mental Status Patient is oriented to person, place and time.  Recent memory is intact.  Remote memory is intact.  Attention span and concentration are intact.  Expressive speech is intact.  Patient's fund of knowledge is within normal limits for educational level.  Cranial Nerves Not formally assessed today  Sensation Grossly intact to  light touch bilateral UEs/LEs as determined by testing dermatomes C2-T2/L2-S2 respectively Proprioception and hot/cold testing deferred on this date   Coordination/Cerebellar Finger to Nose: mild dysmetria bilaterally Heel to Shin: WNL Rapid alternating movements: mild abnormality on L Finger Opposition: more difficulty on L Pronator Drift: Negative  FUNCTIONAL OUTCOME MEASURES   Results Comments  BERG 42/56 Fall risk, in need of intervention  TUG 22.19 seconds   5TSTS 32.28 seconds   10 Meter Gait Speed Self-selected: 20.9 s = 0.48 m/s; Fastest: 17.1s =0.58 m/s22.19 Below normative values for full community ambulation      POSTURAL CONTROL TESTS   Modified Clinical Test of Sensory Interaction for Balance (CTSIB): deferred during today's session    OCULOMOTOR / VESTIBULAR TESTING: deferred today        Plan - 09/23/19 0827    Clinical Impression Statement  Pt is a pleasant 85 year-old male referred for imbalance and difficulty with gait. PT examination reveals deficits balance, gait speed, and strength.  He scored a 42/56 on the BERG today, demonstrating difficulty with performing sit to stand transfers without the use of UEs, reaching forward, picking up objects safely from the floor, turning to look behind him as well as in a circle, and balancing.  His 5xSTS today was a 32.28s, indicating a need of PT intervention to improve technique and LE strength.  He completed the TUG in 22.19s.  His self-selected 10MWT today was 0.48 m/s and his fastest 10MWT was 0.58 m/s, both indicating that he is below the normative values for full community ambulation. Pt  presents with deficits in strength, gait and balance. Pt will benefit from skilled PT services to address deficits in balance and decrease risk for future falls.    Personal Factors and Comorbidities  Age;Comorbidity 2    Comorbidities  HTN, CVA    Examination-Activity Limitations  Bend;Lift;Squat;Locomotion Level;Stairs;Stand     Examination-Participation Restrictions  Community Activity;Driving    Stability/Clinical Decision Making  Evolving/Moderate complexity    Clinical Decision Making  Moderate    Rehab Potential  Good    PT Frequency  2x / week    PT Duration  12 weeks    PT Treatment/Interventions  ADLs/Self Care Home Management;Canalith Repostioning;Cryotherapy;Electrical Stimulation;Iontophoresis 4mg /ml Dexamethasone;Moist Heat;Traction;Ultrasound;DME Instruction;Gait training;Stair training;Therapeutic activities;Therapeutic exercise;Functional mobility training;Balance training;Neuromuscular re-education;Patient/family education;Manual techniques;Dry needling;Vestibular;Joint Manipulations;Spinal Manipulations    PT Next Visit Plan  ABC/DHI; progress strength and balance    PT Home Exercise Plan  introduce next session    Consulted and Agree with Plan of Care  Patient;Family member/caregiver    Family Member Consulted  2 daughters       Patient will benefit from skilled therapeutic intervention in order to improve the following deficits and impairments:  Abnormal gait, Decreased balance, Decreased endurance, Decreased mobility, Difficulty walking, Decreased knowledge of precautions, Decreased safety awareness, Decreased activity tolerance, Decreased strength, Hypermobility  Visit Diagnosis: Other abnormalities of gait and mobility  Unsteadiness on feet  Decreased strength of lower extremity     Problem List Patient Active Problem List   Diagnosis Date Noted  . 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 Great toe pain, right   . Lethargy   . CKD (chronic kidney disease), stage II   . Leukocytosis   . Benign essential HTN   . New onset atrial fibrillation ([place])   . Dysphagia, post-stroke   . 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 Acute ischemic right MCA stroke ([place]) 05/25/2019  . Goals of care, counseling/discussion   . Palliative care by specialist   . DNR (do not  resuscitate) discussion   . Stroke, acute, embolic ([place]) 71/69/6789  . Persistent proteinuria 07/30/2018  . 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 Chronic kidney disease, stage III (moderate) 06/15/2018  . Leg pain 05/06/2018  . Lymphedema 05/06/2018  . Coagulopathy ([place]) 07/07/2017  . Lumbar spondylosis 03/10/2017  . Elevated uric acid in blood 11/20/2016  . BPH (benign prostatic hyperplasia) 05/29/2016  . Peripheral vascular disease of lower extremity ([place]) 03/03/2016  . Gallstone 12/31/2015  . 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 Atherosclerosis of aorta ([place]) 11/22/2015  . Carotid artery narrowing 11/22/2015  . Diverticulosis of colon 11/22/2015  . Decreased creatinine clearance 11/22/2015  . Arthritis, degenerative 11/22/2015  . Lactose intolerance 11/22/2015  . Basal cell carcinoma 11/22/2015  . Essential hypertension 06/21/2015  . Hyperlipemia 06/21/2015  . GERD (gastroesophageal reflux disease) 06/21/2015  . Calculus of kidney 11/18/2013    This entire session was performed under direct supervision and direction of a licensed therapist/therapist assistant . I have personally read, edited and approve of the note as written.   Lutricia Horsfall, SPT Phillips Grout PT, DPT, GCS  Huprich,Jason 09/23/2019, 11:20 AM  [place] MAIN [facility] SERVICES [address] [place], [place], [address] Phone: [phone]   Fax:  [phone]  Name: KASHMIR LYSAGHT MRN: [id number] Date of Birth: 07-14-1926

## 2019-09-21 NOTE — Therapy (Signed)
[place] MAIN [facility] SERVICES [address] [place], [place], [address] Phone: [phone]   Fax:  [phone]  Occupational Therapy Treatment  Patient Details  Name: Jose Castro MRN: [id number] Date of Birth: 11/14/1926 Referring Provider (OT): Steele Sizer   Encounter Date: 09/21/2019  OT End of Session - 09/21/19 1714    Visit Number  1    Number of Visits  24    Date for OT Re-Evaluation  12/14/19    OT Start Time  1400    OT Stop Time  1450    OT Time Calculation (min)  50 min    Activity Tolerance  Patient tolerated treatment well    Behavior During Therapy  [facility] for tasks assessed/performed       Past Medical History:  Diagnosis Date  . Arthritis   . BPH (benign prostatic hyperplasia)   . Chronic kidney disease    had a kidney stone which per family was a cyst that was removed  . Dysrhythmia   . GERD (gastroesophageal reflux disease)   . History of kidney stones   . Hyperlipidemia   . Hypertension   . Splenic vein thrombosis   . Stroke ([place]) 05/22/2019  . Thrombosis 12/2015   mural  area and no notation of heart attack    Past Surgical History:  Procedure Laterality Date  . APPENDECTOMY    . CATARACT EXTRACTION, BILATERAL    . CYSTOSCOPY WITH INSERTION OF UROLIFT    . KIDNEY STONE SURGERY    . KNEE SURGERY    . PACEMAKER INSERTION N/A 07/13/2018   Procedure: INSERTION PACEMAKER-DUEL CHAMBER INITIAL IMPLANT;  Surgeon: Isaias Cowman, MD;  Location: [facility] ORS;  Service: Cardiovascular;  Laterality: N/A;  . SPINE SURGERY      There were no vitals filed for this visit.  Subjective Assessment - 09/21/19 1508    Subjective   Pt. was accompanied by his 2 daughters: [name] and [name]    Pertinent History  Pt. is a 85 y.o. male who was admitted to [facility] with a RMCA DIstribution Infarct on 05/21/2019. Pt. went through Inpatient Rehab, and had home health OT, PT, and ST services. Pt. was previously  residing home alone, and was independent with ADLs, and IADLs. Pt. now has family staying with the pt. around the clock.    Patient Stated Goals  to regain functional indpendence safely    Currently in Pain?  No/denies         [facility] OT Assessment - 09/21/19 1408      Assessment   Medical Diagnosis  CVA    Referring Provider (OT)  Ancil Boozer, Drue Stager    Onset Date/Surgical Date  05/21/19    Hand Dominance  Right    Next MD Visit  [place]    Prior Therapy  Inpatient, and home health      Precautions   Precautions  Fall      Restrictions   Weight Bearing Restrictions  No      Balance Screen   Has the patient fallen in the past 6 months  Yes    How many times?  1   1   Has the patient had a decrease in activity level because of a fear of falling?   No    Is the patient reluctant to leave their home because of a fear of falling?   No      Home  Environment   Family/patient expects to be  discharged to:  Private residence    Living Arrangements  Alone    Available Help at Discharge  Available 24 hours/day   family   Type of [place]  Two level    Bathroom Shower/Tub  [place] - 2 wheels;Shower seat      Prior Function   Level of Independence  Independent    Vocation  Retired   Mudlogger for [employer]   Leisure  golf, trivia, crosswords      ADL   Eating/Feeding  Independent    Grooming  Independent    Armed forces training and education officer Dressing  Independent    Lower Body Dressing  Independent   assist with compression socks. Independent atandard socks.   Leisure centre manager  Supervision/safety      IADL   Light Housekeeping  Needs help with all home maintenance tasks    Meal Prep  Able to complete simple warm meal prep;Able to complete simple cold meal and snack prep    Prior Level of Function Medication Managment  --   pillbox    Financial Management  Manages day-to-day purchases, but needs help with banking, major purchases, etc.      Written Expression   Dominant Hand  Right    Handwriting  75% legible   printed name, and signature     Vision - History   Baseline Vision  Bifocals    Patient Visual Report  Peripheral vision impairment    Additional Comments  Left neglect      Cognition   Overall Cognitive Status  Within Functional Limits for tasks assessed    Attention  --   Impaired   Awareness  Impaired    Problem Solving  Impaired    Behaviors  Impulsive      Sensation   Light Touch  Appears Intact      Coordination   Right 9 Hole Peg Test  28    Left 9 Hole Peg Test  37      Strength   Overall Strength Comments  BUE strength 5/5 overall      Hand Function   Right Hand Grip (lbs)  50    Right Hand Lateral Pinch  15 lbs    Right Hand 3 Point Pinch  15 lbs    Left Hand Grip (lbs)  39    Left Hand Lateral Pinch  14 lbs    Left 3 point pinch  12 lbs                       OT Education - 09/21/19 1713    Education Details  OT services, POC, goals    Person(s) Educated  Patient    Methods  Explanation    Comprehension  Verbalized understanding;Returned demonstration;Verbal cues required          OT Long Term Goals - 09/21/19 1726      OT LONG TERM GOAL #1   Title  Pt. will demonstrate home management tasks with supervision initating safe strategies    Baseline  Eval: Decreased safety awareness    Time  12    Period  Weeks    Status  New    Target Date  12/14/19      OT LONG TERM GOAL #2  Title  Pt. will be able to identify 100% of potential safety hazards in a simulated home environment during ADLs, and IADLs.    Baseline  Eval: Limited safety awareness    Time  12    Period  Weeks    Status  New    Target Date  12/14/19      OT LONG TERM GOAL #3   Title  Pt. will increase left grip strength by 5# of force to assist with holding items duirng ADLs, and IADLs.     Baseline  Eval: decreased left grip strength    Time  12    Period  Weeks    Status  New    Target Date  12/14/19      OT LONG TERM GOAL #4   Title  Pt. will improve left hand [facility] skills by 3 sec of speed to be able to manipulate objects during ADLs, and IADL tasks.    Baseline  Eval: Pt. has difficulty    Time  12    Period  Weeks    Target Date  12/14/19      OT LONG TERM GOAL #5   Title  Pt. will demonstrate visual compensatory strategies as needed during ADLs, and IADL tasks.    Baseline  Eval: Limited left sided awareness    Time  12    Period  Weeks    Status  New    Target Date  12/14/19      Long Term Additional Goals   Additional Long Term Goals  Yes      OT LONG TERM GOAL #6   Title  Pt. will demonstrate cognitive compensatory strategies during ADLs, and IADLs    Baseline  Eval: Limited cognition/problem solving, impulsive    Time  12    Period  Weeks    Status  New    Target Date  12/14/19            Plan - 09/21/19 1715    Clinical Impression Statement  Pt. is a 85 y.o. male who was diagnosed with a RMCA Distribution Infarct secondary to Atrial Fibrillation with Cardio Embolism. Pt. presents with limited left grip strength, decreased left [facility] skills, left peripheral awareness, impaired safety awareness, limited [place] IADL functions which hinder his ability to complete ADL, and IADL functioning safely. Pt. will benefit from OT services to address these impairments in order to work towards maximizing independence, decreasing caregiver burden while promoting safety.    OT Occupational Profile and History  Detailed Assessment- Review of Records and additional review of physical, cognitive, psychosocial history related to current functional performance    Occupational performance deficits (Please refer to evaluation for details):  ADL's;IADL's    Body Structure / Function / Physical Skills  ADL;FMC;ROM;IADL;Strength;Decreased knowledge of use of DME;UE  functional use    Rehab Potential  Excellent    Clinical Decision Making  Several treatment options, min-mod task modification necessary    Comorbidities Affecting Occupational Performance:  May have comorbidities impacting occupational performance    Modification or Assistance to Complete Evaluation   Min-Moderate modification of tasks or assist with assess necessary to complete eval    OT Frequency  2x / week    OT Duration  12 weeks    OT Treatment/Interventions  Self-care/ADL training;DME and/or AE instruction;Therapeutic exercise;Neuromuscular education;Patient/family education;Therapeutic activities    Consulted and Agree with Plan of Care  Patient       Patient will benefit from skilled  therapeutic intervention in order to improve the following deficits and impairments:   Body Structure / Function / Physical Skills: ADL, FMC, ROM, IADL, Strength, Decreased knowledge of use of DME, UE functional use       Visit Diagnosis: Muscle weakness (generalized)  Other lack of coordination    Problem List Patient Active Problem List   Diagnosis Date Noted  . 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 Great toe pain, right   . Lethargy   . CKD (chronic kidney disease), stage II   . Leukocytosis   . Benign essential HTN   . New onset atrial fibrillation ([place])   . Dysphagia, post-stroke   . 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 Acute ischemic right MCA stroke ([place]) 05/25/2019  . Goals of care, counseling/discussion   . Palliative care by specialist   . DNR (do not resuscitate) discussion   . Stroke, acute, embolic ([place]) 75/79/7282  . Persistent proteinuria 07/30/2018  . 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 Chronic kidney disease, stage III (moderate) 06/15/2018  . Leg pain 05/06/2018  . Lymphedema 05/06/2018  . Coagulopathy ([place]) 07/07/2017  . Lumbar spondylosis 03/10/2017  . Elevated uric acid in blood 11/20/2016  . BPH (benign  prostatic hyperplasia) 05/29/2016  . Peripheral vascular disease of lower extremity ([place]) 03/03/2016  . Gallstone 12/31/2015  . 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 Atherosclerosis of aorta ([place]) 11/22/2015  . Carotid artery narrowing 11/22/2015  . Diverticulosis of colon 11/22/2015  . Decreased creatinine clearance 11/22/2015  . Arthritis, degenerative 11/22/2015  . Lactose intolerance 11/22/2015  . Basal cell carcinoma 11/22/2015  . Essential hypertension 06/21/2015  . Hyperlipemia 06/21/2015  . GERD (gastroesophageal reflux disease) 06/21/2015  . Calculus of kidney 11/18/2013    Harrel Carina, MS, OTR/L 09/21/2019, 5:47 PM  [place] MAIN [facility] SERVICES [address] [place], [place], [address] Phone: [phone]   Fax:  [phone]  Name: Jose Castro MRN: [id number] Date of Birth: Jun 26, 1926

## 2019-09-22 ENCOUNTER — Encounter: Payer: Self-pay | Admitting: Adult Health

## 2019-09-26 ENCOUNTER — Other Ambulatory Visit: Payer: Self-pay

## 2019-09-26 ENCOUNTER — Encounter: Payer: Medicare Other | Admitting: Occupational Therapy

## 2019-09-26 ENCOUNTER — Encounter: Payer: Self-pay | Admitting: Physical Therapy

## 2019-09-26 ENCOUNTER — Ambulatory Visit: Payer: Medicare Other | Admitting: Physical Therapy

## 2019-09-26 DIAGNOSIS — R29898 Other symptoms and signs involving the musculoskeletal system: Secondary | ICD-10-CM

## 2019-09-26 DIAGNOSIS — R278 Other lack of coordination: Secondary | ICD-10-CM

## 2019-09-26 DIAGNOSIS — R2689 Other abnormalities of gait and mobility: Secondary | ICD-10-CM | POA: Diagnosis not present

## 2019-09-26 DIAGNOSIS — M6281 Muscle weakness (generalized): Secondary | ICD-10-CM

## 2019-09-26 DIAGNOSIS — R2681 Unsteadiness on feet: Secondary | ICD-10-CM

## 2019-09-26 NOTE — Patient Instructions (Addendum)
SIT TO STAND: Feet in Modified Tandem    Place one foot in front of the other. Lean chest forward. Raise hips and straighten knees to stand. ___ reps per set, ___ sets per day, ___ days per week Hold onto a support. Repeat with other leg.  Copyright  VHI. All rights reserved.  Hip Flexion / Knee Extension: Straight-Leg Raise (Eccentric)    Lie on back. Lift leg with knee straight. Slowly lower leg for 3-5 seconds. ___ reps per set, ___ sets per day, ___ days per week. Lower like elevator, stopping at each floor. Add ___ lbs when you achieve ___ repetitions. Rest on elbows. Rest on straight arms.  Copyright  VHI. All rights reserved.  Hip Extension: 2-4 Inches    Tighten gluteal muscle. Lift one leg ___ times. Restabilize pelvis. Repeat with other leg. Keep pelvis still. Be sure pelvis does not rotate and back does not arch. Do ___ sets, ___ times per day.  http://ss.exer.us/62   Copyright  VHI. All rights reserved.  Hip Abduction: Modified    Lying on right side with pillow between thighs, raise top leg from pillow, rotating slightly out. Repeat ____ times per set. Do ____ sets per session. Do ____ sessions per day.  http://orth.exer.us/704   Copyright  VHI. All rights reserved.  Bridge    Lie back, legs bent. Inhale, pressing hips up. Keeping ribs in, lengthen lower back. Exhale, rolling down along spine from top. Repeat ____ times. Do ____ sessions per day.  http://pm.exer.us/54   Copyright  VHI. All rights reserved.  Heel Raises    Stand with support. Tighten pelvic floor and hold. With knees straight, raise heels off ground. Hold ___ seconds. Relax for ___ seconds. Repeat ___ times. Do ___ times a day.  Copyright  VHI. All rights reserved.

## 2019-09-26 NOTE — Therapy (Signed)
[place] MAIN [facility] SERVICES [address] [place], [place], [address] Phone: [phone]   Fax:  [phone]  Physical Therapy Treatment  Patient Details  Name: Jose Castro MRN: [id number] Date of Birth: [birth year] Referring Provider (PT): Dr. Letta Pate   Encounter Date: 09/26/2019  PT End of Session - 09/26/19 1306    Visit Number  2    Number of Visits  25    Date for PT Re-Evaluation  12/14/19    PT Start Time  1300    PT Stop Time  1340    PT Time Calculation (min)  40 min    Equipment Utilized During Treatment  Gait belt    Activity Tolerance  Patient tolerated treatment well    Behavior During Therapy  [facility] for tasks assessed/performed;Impulsive       Past Medical History:  Diagnosis Date  . Arthritis   . BPH (benign prostatic hyperplasia)   . Chronic kidney disease    had a kidney stone which per family was a cyst that was removed  . Dysrhythmia   . GERD (gastroesophageal reflux disease)   . History of kidney stones   . Hyperlipidemia   . Hypertension   . Splenic vein thrombosis   . Stroke ([place]) 05/22/2019  . Thrombosis 12/2015   mural  area and no notation of heart attack    Past Surgical History:  Procedure Laterality Date  . APPENDECTOMY    . CATARACT EXTRACTION, BILATERAL    . CYSTOSCOPY WITH INSERTION OF UROLIFT    . KIDNEY STONE SURGERY    . KNEE SURGERY    . PACEMAKER INSERTION N/A 07/13/2018   Procedure: INSERTION PACEMAKER-DUEL CHAMBER INITIAL IMPLANT;  Surgeon: Isaias Cowman, MD;  Location: [facility] ORS;  Service: Cardiovascular;  Laterality: N/A;  . SPINE SURGERY      There were no vitals filed for this visit.  Subjective Assessment - 09/26/19 1305    Subjective  Pt has no new concerns or any pain.    Patient is accompained by:  Family member    Pertinent History  Pt is a 85 year old male who presents with imbalance and difficulty with gait following a right frontal CVA with left hemiparesis on  05/22/19.  He has completed inpatient and home health PT, and now presents for OP PT.  He was discharged from inpatient rehab at a supervision level of assistance, ambulating with a RW.  His family notes that he is impulsive and will try to ambulate without his RW at home.  Family is currently providing 24/7 supervision.    Limitations  House hold activities;Walking    How long can you sit comfortably?  no limitations    How long can you walk comfortably?  he requires a RW at home    Patient Stated Goals  get back to normal; want to be able to play golf and go out to play trivia again.  His daughters want him to regain independence with going to the bathroom at night    Currently in Pain?  No/denies    Pain Score  0-No pain       Therapeutic exercise: Nu-step x 5 mins L 4  Supine: SLR x 15 BLE Bridging x 15, 5 sec hold  From mat: Sit to stand x 10 with bottom scooted out to edge of mat Sit to stand with staggered stand alternating foot in front x 10 x 2  Sit to stand with foam x 10  Sidelying: Hip abd x 15 , BLE  Seated hip abd/ER with BTB , double tied x 15  Pt educated throughout session about proper posture and technique with exercises. Improved exercise technique, movement at target joints, use of target muscles after min to mod verbal, visual, tactile cues.                          PT Education - 09/26/19 1306    Education Details  HEP    Person(s) Educated  Patient    Methods  Explanation    Comprehension  Verbalized understanding;Returned demonstration;Need further instruction       PT Short Term Goals - 09/23/19 1052      PT SHORT TERM GOAL #1   Title  Pt will be independent with HEP in order to improve strength and balance in order to decrease fall risk and improve function at home and work.    Time  6    Period  Weeks    Status  New    Target Date  11/02/19        PT Long Term Goals - 09/23/19 1053      PT LONG TERM GOAL #1   Title   Pt will improve BERG to >45 in order to decrease his fall risk.    Baseline  09/21/19: 42/56    Time  12    Period  Weeks    Status  New    Target Date  12/14/19      PT LONG TERM GOAL #2   Title  Pt will decrease TUG to below 14 seconds/decrease in order to demonstrate decreased fall risk.    Baseline  09/21/19: 22.19s    Time  12    Period  Weeks    Status  New    Target Date  12/14/19      PT LONG TERM GOAL #3   Title  Pt will decrease 5TSTS by at least 3 seconds in order to demonstrate clinically significant improvement in LE strength.    Baseline  09/21/19: 32.28s    Time  12    Period  Weeks    Status  New    Target Date  12/14/19      PT LONG TERM GOAL #4   Title  Pt will increase his 10MWT by at least 0.13 m/s in order to demonstrate a clinically significant improvement in gait speed.    Baseline  09/21/19: Self-selected: 20.9 s = 0.48 m/s; Fastest: 17.1s =0.58 m/s    Time  12    Period  Weeks    Status  New    Target Date  12/14/19            Plan - 09/26/19 1306    Clinical Impression Statement  Continuous verbal cues and tactile cues needed to correct form with exercises and for posture correction. Patient required min verbal cues to perform exercises for posture and control and required verbal and tactile cues during all activities.  Patient is able to perform strengthening exercises with reports of pain. Patient will benefit from further skilled therapy to return to prior level of function.   Personal Factors and Comorbidities  Age;Comorbidity 2    Comorbidities  HTN, CVA    Examination-Activity Limitations  Bend;Lift;Squat;Locomotion Level;Stairs;Stand    Examination-Participation Restrictions  Community Activity;Driving    Stability/Clinical Decision Making  Evolving/Moderate complexity    Rehab Potential  Good    PT  Frequency  2x / week    PT Duration  12 weeks    PT Treatment/Interventions  ADLs/Self Care Home Management;[place]  Repostioning;Cryotherapy;Electrical Stimulation;Iontophoresis 4mg /ml Dexamethasone;Moist Heat;Traction;Ultrasound;DME Instruction;Gait training;Stair training;Therapeutic activities;Therapeutic exercise;Functional mobility training;Balance training;Neuromuscular re-education;Patient/family education;Manual techniques;Dry needling;Vestibular;Joint Manipulations;Spinal Manipulations    PT Next Visit Plan  ABC/DHI; progress strength and balance    PT Home Exercise Plan  introduce next session    Consulted and Agree with Plan of Care  Patient;Family member/caregiver    Family Member Consulted  2 daughters       Patient will benefit from skilled therapeutic intervention in order to improve the following deficits and impairments:  Abnormal gait, Decreased balance, Decreased endurance, Decreased mobility, Difficulty walking, Decreased knowledge of precautions, Decreased safety awareness, Decreased activity tolerance, Decreased strength, Hypermobility  Visit Diagnosis: Muscle weakness (generalized)  Other lack of coordination  Other abnormalities of gait and mobility  Unsteadiness on feet  Decreased strength of lower extremity     Problem List Patient Active Problem List   Diagnosis Date Noted  . 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 Great toe pain, right   . Lethargy   . CKD (chronic kidney disease), stage II   . Leukocytosis   . Benign essential HTN   . New onset atrial fibrillation ([place])   . Dysphagia, post-stroke   . 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 Acute ischemic right MCA stroke ([place]) 05/25/2019  . Goals of care, counseling/discussion   . Palliative care by specialist   . DNR (do not resuscitate) discussion   . Stroke, acute, embolic ([place]) 03/54/6568  . Persistent proteinuria 07/30/2018  . 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 Chronic kidney disease, stage III (moderate) 06/15/2018  . Leg pain 05/06/2018  . Lymphedema  05/06/2018  . Coagulopathy ([place]) 07/07/2017  . Lumbar spondylosis 03/10/2017  . Elevated uric acid in blood 11/20/2016  . BPH (benign prostatic hyperplasia) 05/29/2016  . Peripheral vascular disease of lower extremity ([place]) 03/03/2016  . Gallstone 12/31/2015  . 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 Atherosclerosis of aorta ([place]) 11/22/2015  . Carotid artery narrowing 11/22/2015  . Diverticulosis of colon 11/22/2015  . Decreased creatinine clearance 11/22/2015  . Arthritis, degenerative 11/22/2015  . Lactose intolerance 11/22/2015  . Basal cell carcinoma 11/22/2015  . Essential hypertension 06/21/2015  . Hyperlipemia 06/21/2015  . GERD (gastroesophageal reflux disease) 06/21/2015  . Calculus of kidney 11/18/2013    Alanson Puls, PT DPT 09/26/2019, 1:08 PM  [place] MAIN [facility] SERVICES [address] [place], [place], [address] Phone: [phone]   Fax:  [phone]  Name: Jose Castro MRN: [id number] Date of Birth: 02-13-1926

## 2019-09-28 ENCOUNTER — Ambulatory Visit: Payer: Medicare Other | Admitting: Occupational Therapy

## 2019-10-03 ENCOUNTER — Ambulatory Visit: Payer: Medicare Other | Attending: Physical Medicine & Rehabilitation | Admitting: Occupational Therapy

## 2019-10-03 ENCOUNTER — Encounter: Payer: Self-pay | Admitting: Occupational Therapy

## 2019-10-03 ENCOUNTER — Other Ambulatory Visit: Payer: Self-pay

## 2019-10-03 DIAGNOSIS — R29898 Other symptoms and signs involving the musculoskeletal system: Secondary | ICD-10-CM | POA: Insufficient documentation

## 2019-10-03 DIAGNOSIS — I63511 Cerebral infarction due to unspecified occlusion or stenosis of right middle cerebral artery: Secondary | ICD-10-CM | POA: Diagnosis present

## 2019-10-03 DIAGNOSIS — R2689 Other abnormalities of gait and mobility: Secondary | ICD-10-CM | POA: Insufficient documentation

## 2019-10-03 DIAGNOSIS — R2681 Unsteadiness on feet: Secondary | ICD-10-CM | POA: Insufficient documentation

## 2019-10-03 DIAGNOSIS — M6281 Muscle weakness (generalized): Secondary | ICD-10-CM

## 2019-10-03 DIAGNOSIS — R278 Other lack of coordination: Secondary | ICD-10-CM | POA: Insufficient documentation

## 2019-10-03 NOTE — Therapy (Signed)
[place] MAIN [facility] SERVICES [address] [place], [place], [address] Phone: [phone]   Fax:  [phone]  Occupational Therapy Treatment  Patient Details  Name: Jose Castro MRN: [id number] Date of Birth: 1925-12-11 Referring Provider (OT): Steele Sizer   Encounter Date: 10/03/2019  OT End of Session - 10/03/19 1204    Visit Number  2    Number of Visits  24    Date for OT Re-Evaluation  12/14/19    OT Start Time  1100    OT Stop Time  1150    OT Time Calculation (min)  50 min    Activity Tolerance  Patient tolerated treatment well    Behavior During Therapy  [facility] for tasks assessed/performed;Impulsive       Past Medical History:  Diagnosis Date  . Arthritis   . BPH (benign prostatic hyperplasia)   . Chronic kidney disease    had a kidney stone which per family was a cyst that was removed  . Dysrhythmia   . GERD (gastroesophageal reflux disease)   . History of kidney stones   . Hyperlipidemia   . Hypertension   . Splenic vein thrombosis   . Stroke ([place]) 05/22/2019  . Thrombosis 12/2015   mural  area and no notation of heart attack    Past Surgical History:  Procedure Laterality Date  . APPENDECTOMY    . CATARACT EXTRACTION, BILATERAL    . CYSTOSCOPY WITH INSERTION OF UROLIFT    . KIDNEY STONE SURGERY    . KNEE SURGERY    . PACEMAKER INSERTION N/A 07/13/2018   Procedure: INSERTION PACEMAKER-DUEL CHAMBER INITIAL IMPLANT;  Surgeon: Isaias Cowman, MD;  Location: [facility] ORS;  Service: Cardiovascular;  Laterality: N/A;  . SPINE SURGERY      There were no vitals filed for this visit.  Subjective Assessment - 10/03/19 1202    Subjective   Pt. was present at the session with one daughter today.    Pertinent History  Pt. is a 85 y.o. male who was admitted to [facility] with a [facility]arct on 05/21/2019. Pt. went through Inpatient Rehab, and had home health OT, PT, and ST services. Pt. was previously  residing home alone, and was independent with ADLs, and IADLs. Pt. now has family staying with the pt. around the clock.    Currently in Pain?  No/denies      OT TREATMENT    Therapeutic Exercise:  Pt. performed left gross gripping with grip strengthener. Pt. worked on sustaining grip while grasping pegs and reaching at various heights to the left. The Gripper was placed at 44.9# of grip strength force, and ugraded to 23.4#. Pt. Worked on pinch strengthening in the left hand for lateral, and 3pt. pinch using yellow, red, green, blue, and black resistive clips. Pt. worked on placing the clips at various horizontal angles/heights to challenge left sided awareness. Consistent verbal cues, and visual demonstration was required for eliciting the desired movement. Pt. Performed hand strengthening with green theraputty. Pt. required cues for proper technique. Pt. worked on green thearputty ex. for hand strengthening. Exercises included: gross gripping, gross digit extension, thumb abduction, lateral, and 3pt. pinch strengthening, digit abduction, and thumb opposition. Pt. was provided with a visual handout HEP through [facility].  Response to Treatment:   Pt. was able to accurately place resistive clips with 100% accuracy when challenged at various angles, and heights to the far left. Pt. required consistent step-by step cues, and visual  demonstration for form and technique with resistive clips, and theraputty exercises. Pt. required verbal cues to perform reps of 3 for each resistive clip pinch. Pt. requires redirection secondary to perseverating with repeated multiple reps with each resistive clip. Pt. tolerated Left gross gripping well with increasing resistance. Pt. continues to work on improving LUE grip strength, pinch strength, [place] skills, and compensatory startegies for left sided awareness in order to improve, and maximize independence, and safety during ADLs, and IADLs.                            OT Education - 10/03/19 1204    Education Details  Theraputty HEP    Person(s) Educated  Patient    Methods  Explanation    Comprehension  Verbalized understanding;Returned demonstration;Verbal cues required          OT Long Term Goals - 09/21/19 1726      OT LONG TERM GOAL #1   Title  Pt. will demonstrate home management tasks with supervision initating safe strategies    Baseline  Eval: Decreased safety awareness    Time  12    Period  Weeks    Status  New    Target Date  12/14/19      OT LONG TERM GOAL #2   Title  Pt. will be able to identify 100% of potential safety hazards in a simulated home environment during ADLs, and IADLs.    Baseline  Eval: Limited safety awareness    Time  12    Period  Weeks    Status  New    Target Date  12/14/19      OT LONG TERM GOAL #3   Title  Pt. will increase left grip strength by 5# of force to assist with holding items duirng ADLs, and IADLs.    Baseline  Eval: decreased left grip strength    Time  12    Period  Weeks    Status  New    Target Date  12/14/19      OT LONG TERM GOAL #4   Title  Pt. will improve left hand [facility] skills by 3 sec of speed to be able to manipulate objects during ADLs, and IADL tasks.    Baseline  Eval: Pt. has difficulty    Time  12    Period  Weeks    Target Date  12/14/19      OT LONG TERM GOAL #5   Title  Pt. will demonstrate visual compensatory strategies as needed during ADLs, and IADL tasks.    Baseline  Eval: Limited left sided awareness    Time  12    Period  Weeks    Status  New    Target Date  12/14/19      Long Term Additional Goals   Additional Long Term Goals  Yes      OT LONG TERM GOAL #6   Title  Pt. will demonstrate cognitive compensatory strategies during ADLs, and IADLs    Baseline  Eval: Limited cognition/problem solving, impulsive    Time  12    Period  Weeks    Status  New    Target Date  12/14/19            Plan  - 10/03/19 1206    Clinical Impression Statement Pt. was able to accurately place resistive clips with 100% accuracy when challenged at various angles, and heights to the  far left. Pt. required consistent step-by step cues, and visual demonstration for form and technique with resistive clips, and theraputty exercises. Pt. required verbal cues to perform reps of 3 for each resistive clip pinch. Pt. requires redirection secondary to perseverating with repeated multiple reps with each resistive clip. Pt. tolerated Left gross gripping well with increasing resistance. Pt. continues to work on improving LUE grip strength, pinch strength, [facility] skills, and compensatory strategies for left sided awareness in order to improve, and maximize independence, and safety during ADLs, and IADLs.     OT Occupational Profile and History  Detailed Assessment- Review of Records and additional review of physical, cognitive, psychosocial history related to current functional performance    Occupational performance deficits (Please refer to evaluation for details):  ADL's;IADL's    Body Structure / Function / Physical Skills  ADL;FMC;ROM;IADL;Strength;Decreased knowledge of use of DME;UE functional use    Rehab Potential  Excellent    Clinical Decision Making  Several treatment options, min-mod task modification necessary    Comorbidities Affecting Occupational Performance:  May have comorbidities impacting occupational performance    Modification or Assistance to Complete Evaluation   Min-Moderate modification of tasks or assist with assess necessary to complete eval    OT Frequency  2x / week    OT Duration  12 weeks    OT Treatment/Interventions  Self-care/ADL training;DME and/or AE instruction;Therapeutic exercise;Neuromuscular education;Patient/family education;Therapeutic activities    Consulted and Agree with Plan of Care  Patient       Patient will benefit from skilled therapeutic intervention in order to improve the  following deficits and impairments:   Body Structure / Function / Physical Skills: ADL, FMC, ROM, IADL, Strength, Decreased knowledge of use of DME, UE functional use       Visit Diagnosis: Muscle weakness (generalized)  Other lack of coordination    Problem List Patient Active Problem List   Diagnosis Date Noted  . 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 Great toe pain, right   . Lethargy   . CKD (chronic kidney disease), stage II   . Leukocytosis   . Benign essential HTN   . New onset atrial fibrillation ([place])   . Dysphagia, post-stroke   . 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 Acute ischemic right MCA stroke ([place]) 05/25/2019  . Goals of care, counseling/discussion   . Palliative care by specialist   . DNR (do not resuscitate) discussion   . Stroke, acute, embolic ([place]) 62/56/3893  . Persistent proteinuria 07/30/2018  . 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 Chronic kidney disease, stage III (moderate) 06/15/2018  . Leg pain 05/06/2018  . Lymphedema 05/06/2018  . Coagulopathy ([place]) 07/07/2017  . Lumbar spondylosis 03/10/2017  . Elevated uric acid in blood 11/20/2016  . BPH (benign prostatic hyperplasia) 05/29/2016  . Peripheral vascular disease of lower extremity ([place]) 03/03/2016  . Gallstone 12/31/2015  . 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 Atherosclerosis of aorta ([place]) 11/22/2015  . Carotid artery narrowing 11/22/2015  . Diverticulosis of colon 11/22/2015  . Decreased creatinine clearance 11/22/2015  . Arthritis, degenerative 11/22/2015  . Lactose intolerance 11/22/2015  . Basal cell carcinoma 11/22/2015  . Essential hypertension 06/21/2015  . Hyperlipemia 06/21/2015  . GERD (gastroesophageal reflux disease) 06/21/2015  . Calculus of kidney 11/18/2013    Harrel Carina, MS, OTR/L 10/03/2019, 12:21 PM  [place] MAIN [facility] SERVICES [address]  [place], [place], [address] Phone: [phone]   Fax:  [phone]  Name: Jose Castro MRN: [id number] Date of Birth: 04/05/1926

## 2019-10-04 ENCOUNTER — Ambulatory Visit: Payer: Medicare Other | Admitting: Adult Health

## 2019-10-06 ENCOUNTER — Ambulatory Visit: Payer: Medicare Other | Admitting: Occupational Therapy

## 2019-10-06 ENCOUNTER — Encounter: Payer: Self-pay | Admitting: Physical Therapy

## 2019-10-06 ENCOUNTER — Encounter: Payer: Self-pay | Admitting: Occupational Therapy

## 2019-10-06 ENCOUNTER — Other Ambulatory Visit: Payer: Self-pay

## 2019-10-06 ENCOUNTER — Ambulatory Visit: Payer: Medicare Other | Admitting: Physical Therapy

## 2019-10-06 DIAGNOSIS — M6281 Muscle weakness (generalized): Secondary | ICD-10-CM

## 2019-10-06 DIAGNOSIS — R2689 Other abnormalities of gait and mobility: Secondary | ICD-10-CM

## 2019-10-06 DIAGNOSIS — R2681 Unsteadiness on feet: Secondary | ICD-10-CM

## 2019-10-06 DIAGNOSIS — R278 Other lack of coordination: Secondary | ICD-10-CM

## 2019-10-06 NOTE — Therapy (Signed)
[name] [facility] SERVICES [address] [place], [place], [address] Phone: [phone]   Fax:  [phone]  Occupational Therapy Treatment  Patient Details  Name: Jose Castro MRN: [id number] Date of Birth: [birth year] Referring Provider (OT): Steele Sizer   Encounter Date: 10/06/2019    Past Medical History:  Diagnosis Date  . Arthritis   . BPH (benign prostatic hyperplasia)   . Chronic kidney disease    had a kidney stone which per family was a cyst that was removed  . Dysrhythmia   . GERD (gastroesophageal reflux disease)   . History of kidney stones   . Hyperlipidemia   . Hypertension   . Splenic vein thrombosis   . Stroke ([place]) 05/22/2019  . Thrombosis 12/2015   mural  area and no notation of heart attack    Past Surgical History:  Procedure Laterality Date  . APPENDECTOMY    . CATARACT EXTRACTION, BILATERAL    . CYSTOSCOPY WITH INSERTION OF UROLIFT    . KIDNEY STONE SURGERY    . KNEE SURGERY    . PACEMAKER INSERTION N/A 07/13/2018   Procedure: INSERTION PACEMAKER-DUEL CHAMBER INITIAL IMPLANT;  Surgeon: Isaias Cowman, MD;  Location: [facility] ORS;  Service: Cardiovascular;  Laterality: N/A;  . SPINE SURGERY      There were no vitals filed for this visit.    OT TREATMENT    Neuro muscular re-education:  Pt. worked on left handFMC skillsgrasping 1" sticks, 1/4" collars, and 1/4" washers. Pt. worked on storing the sticks in the palm, and translatory skills moving the items from the palm of the hand to the tip of the 2nd digit, and thumb. Pt. Worked on constructing a 3 piece tower alternating with the right, and left challenged with environmental stimulae. Pt. worked on removing the pegs using bilateral alternating hand patterns. Pt. Education was provided about HEP for left hand [facility] skills. A visual handout was provided.  Therapeutic Exercise:  Pt. performed gross gripping with grip strengthener. Pt. worked on  sustaining grip while grasping pegs and reaching at various heights. The Gripper was set at 23.4# of grip strength resistance.                              OT Long Term Goals - 09/21/19 1726      OT LONG TERM GOAL #1   Title  Pt. will demonstrate home management tasks with supervision initating safe strategies    Baseline  Eval: Decreased safety awareness    Time  12    Period  Weeks    Status  New    Target Date  12/14/19      OT LONG TERM GOAL #2   Title  Pt. will be able to identify 100% of potential safety hazards in a simulated home environment during ADLs, and IADLs.    Baseline  Eval: Limited safety awareness    Time  12    Period  Weeks    Status  New    Target Date  12/14/19      OT LONG TERM GOAL #3   Title  Pt. will increase left grip strength by 5# of force to assist with holding items duirng ADLs, and IADLs.    Baseline  Eval: decreased left grip strength    Time  12    Period  Weeks    Status  New    Target Date  12/14/19  OT LONG TERM GOAL #4   Title  Pt. will improve left hand [facility] skills by 3 sec of speed to be able to manipulate objects during ADLs, and IADL tasks.    Baseline  Eval: Pt. has difficulty    Time  12    Period  Weeks    Target Date  12/14/19      OT LONG TERM GOAL #5   Title  Pt. will demonstrate visual compensatory strategies as needed during ADLs, and IADL tasks.    Baseline  Eval: Limited left sided awareness    Time  12    Period  Weeks    Status  New    Target Date  12/14/19      Long Term Additional Goals   Additional Long Term Goals  Yes      OT LONG TERM GOAL #6   Title  Pt. will demonstrate cognitive compensatory strategies during ADLs, and IADLs    Baseline  Eval: Limited cognition/problem solving, impulsive    Time  12    Period  Weeks    Status  New    Target Date  12/14/19              Patient will benefit from skilled therapeutic intervention in order to improve the following  deficits and impairments:           Visit Diagnosis: No diagnosis found.    Problem List Patient Active Problem List   Diagnosis Date Noted  . 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 Great toe pain, right   . Lethargy   . CKD (chronic kidney disease), stage II   . Leukocytosis   . Benign essential HTN   . New onset atrial fibrillation ([place])   . Dysphagia, post-stroke   . 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 Acute ischemic right MCA stroke ([place]) 05/25/2019  . Goals of care, counseling/discussion   . Palliative care by specialist   . DNR (do not resuscitate) discussion   . Stroke, acute, embolic ([place]) 71/05/2693  . Persistent proteinuria 07/30/2018  . 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 Chronic kidney disease, stage III (moderate) 06/15/2018  . Leg pain 05/06/2018  . Lymphedema 05/06/2018  . Coagulopathy ([place]) 07/07/2017  . Lumbar spondylosis 03/10/2017  . Elevated uric acid in blood 11/20/2016  . BPH (benign prostatic hyperplasia) 05/29/2016  . Peripheral vascular disease of lower extremity ([place]) 03/03/2016  . Gallstone 12/31/2015  . 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 Atherosclerosis of aorta ([place]) 11/22/2015  . Carotid artery narrowing 11/22/2015  . Diverticulosis of colon 11/22/2015  . Decreased creatinine clearance 11/22/2015  . Arthritis, degenerative 11/22/2015  . Lactose intolerance 11/22/2015  . Basal cell carcinoma 11/22/2015  . Essential hypertension 06/21/2015  . Hyperlipemia 06/21/2015  . GERD (gastroesophageal reflux disease) 06/21/2015  . Calculus of kidney 11/18/2013    Harrel Carina 10/06/2019, 12:43 PM  [place] MAIN [facility] SERVICES [address] [place], [place], [address] Phone: [phone]   Fax:  [phone]  Name: COWAN PILAR MRN: [id number] Date of Birth: 02-28-1926

## 2019-10-06 NOTE — Therapy (Signed)
[place] MAIN [facility] SERVICES [address] [place], [place], [address] Phone: [phone]   Fax:  [phone]  Physical Therapy Treatment  Patient Details  Name: Jose Castro MRN: [id number] Date of Birth: Aug 11, 1926 Referring Provider (PT): Dr. Letta Pate   Encounter Date: 10/06/2019  PT End of Session - 10/06/19 1149    Visit Number  3    Number of Visits  25    Date for PT Re-Evaluation  12/14/19    PT Start Time  1146    PT Stop Time  1228    PT Time Calculation (min)  42 min    Equipment Utilized During Treatment  Gait belt    Activity Tolerance  Patient tolerated treatment well    Behavior During Therapy  [facility] for tasks assessed/performed;Impulsive       Past Medical History:  Diagnosis Date  . Arthritis   . BPH (benign prostatic hyperplasia)   . Chronic kidney disease    had a kidney stone which per family was a cyst that was removed  . Dysrhythmia   . GERD (gastroesophageal reflux disease)   . History of kidney stones   . Hyperlipidemia   . Hypertension   . Splenic vein thrombosis   . Stroke ([place]) 05/22/2019  . Thrombosis 12/2015   mural  area and no notation of heart attack    Past Surgical History:  Procedure Laterality Date  . APPENDECTOMY    . CATARACT EXTRACTION, BILATERAL    . CYSTOSCOPY WITH INSERTION OF UROLIFT    . KIDNEY STONE SURGERY    . KNEE SURGERY    . PACEMAKER INSERTION N/A 07/13/2018   Procedure: INSERTION PACEMAKER-DUEL CHAMBER INITIAL IMPLANT;  Surgeon: Isaias Cowman, MD;  Location: [facility] ORS;  Service: Cardiovascular;  Laterality: N/A;  . SPINE SURGERY      There were no vitals filed for this visit.  Subjective Assessment - 10/06/19 1148    Subjective  Patient reports doing well, no soreness today; Reports adherence with HEP;    Patient is accompained by:  Family member    Pertinent History  Pt is a 85 year old male who presents with imbalance and difficulty with gait following a right  frontal CVA with left hemiparesis on 05/22/19.  He has completed inpatient and home health PT, and now presents for OP PT.  He was discharged from inpatient rehab at a supervision level of assistance, ambulating with a RW.  His family notes that he is impulsive and will try to ambulate without his RW at home.  Family is currently providing 24/7 supervision.    Limitations  House hold activities;Walking    How long can you sit comfortably?  no limitations    How long can you walk comfortably?  he requires a RW at home    Patient Stated Goals  get back to normal; want to be able to play golf and go out to play trivia again.  His daughters want him to regain independence with going to the bathroom at night    Currently in Pain?  No/denies    Multiple Pain Sites  No            Treatment: Warm up on  Nustep  with BUE/BLE level 2 x4 min with cues to keep speed >50 spm;   NMR: Instructed patient in balance exercise: Standing on airex foam: Heel/toe raises x15 reps with rail assist for safety with cues to keep hips in neutral for better ankle AROM  for improved ankle strategies; Feet apart:  Standing unsupported side/side weight shift x10 reps to reorient to midline, CGA for safety  Balloon taps with daughter x2-3 min with min A for safety and cues to improve erect posture with reaching outside base of support to challenge stance control;  Feet together:  Eyes open/closed 15 sec hold x3 reps each with CGA to min A for safety and cues to reorient to midline for better stance control;   Standing on airex: Modified tandem stance:  Unsupported standing 15 sec hold x   Reps each foot in front  Head turns side/side, up/down x5 reps each with CGA for safety;  BUE ball pass side/side each foot in front;  Standing on airex: Alternate toe taps to 6 inch step with 2-1 rail assist x15 bilaterally with min A for safety and cues for sequencing/positioning; Standing one foot on airex, one foot on 6 inch  step unsupported 15 sec hold x1 rep each foot on step; Progressing to head turns side/side, up/down x5 reps each foot on step to challenge stance control and improve LE strength;   Standing on 1/2 bolster (flat side up): Heel/toe rocks x15 reps with B rail assist for safety with cues to keep knee straight to challenge hip/ankle strategies; Unsupported standing in neutral unsupported, alternate UE lift x5 reps with min A for safety with increased posterior loss of balance noted; Required increased time and cues to improve weight shift for better stance control;    Response to treatment: Tolerated well. Patient does fatigue with prolonged standing requiring intermittent seated rest breaks. He requires CGA to min A with advanced balance tasks and min-mod VCs for proper stance control/weight shift. He is impulsive and hard of hearing making learning new tasks challenging. Reinforced HEP at end of session;                     PT Education - 10/06/19 1149    Education Details  LE strength, balance, HEP reinforced;    Person(s) Educated  Patient;Child(ren)    Methods  Explanation;Verbal cues    Comprehension  Verbalized understanding;Returned demonstration;Verbal cues required;Need further instruction       PT Short Term Goals - 09/23/19 1052      PT SHORT TERM GOAL #1   Title  Pt will be independent with HEP in order to improve strength and balance in order to decrease fall risk and improve function at home and work.    Time  6    Period  Weeks    Status  New    Target Date  11/02/19        PT Long Term Goals - 09/23/19 1053      PT LONG TERM GOAL #1   Title  Pt will improve BERG to >45 in order to decrease his fall risk.    Baseline  09/21/19: 42/56    Time  12    Period  Weeks    Status  New    Target Date  12/14/19      PT LONG TERM GOAL #2   Title  Pt will decrease TUG to below 14 seconds/decrease in order to demonstrate decreased fall risk.    Baseline   09/21/19: 22.19s    Time  12    Period  Weeks    Status  New    Target Date  12/14/19      PT LONG TERM GOAL #3   Title  Pt will decrease  5TSTS by at least 3 seconds in order to demonstrate clinically significant improvement in LE strength.    Baseline  09/21/19: 32.28s    Time  12    Period  Weeks    Status  New    Target Date  12/14/19      PT LONG TERM GOAL #4   Title  Pt will increase his 10MWT by at least 0.13 m/s in order to demonstrate a clinically significant improvement in gait speed.    Baseline  09/21/19: Self-selected: 20.9 s = 0.48 m/s; Fastest: 17.1s =0.58 m/s    Time  12    Period  Weeks    Status  New    Target Date  12/14/19            Plan - 10/06/19 1253    Clinical Impression Statement  Patient instructed in advanced balance tasks utilzing compliant surfaces to challenge stance control. He required CGA to min A throughout session for safety and min-mod VCs for proper exercise technique. patient exhibits decreased hip/ankle strategies often relying on knees for balance. He reports mild-moderate fatigue at end of session. Reinforced HEP. He would benefit from additional skilled PT Intervention to improve strength, balance and gait safety;    Personal Factors and Comorbidities  Age;Comorbidity 2    Comorbidities  HTN, CVA    Examination-Activity Limitations  Bend;Lift;Squat;Locomotion Level;Stairs;Stand    Examination-Participation Restrictions  Community Activity;Driving    Stability/Clinical Decision Making  Evolving/Moderate complexity    Rehab Potential  Good    PT Frequency  2x / week    PT Duration  12 weeks    PT Treatment/Interventions  ADLs/Self Care Home Management;Canalith Repostioning;Cryotherapy;Electrical Stimulation;Iontophoresis 4mg /ml Dexamethasone;Moist Heat;Traction;Ultrasound;DME Instruction;Gait training;Stair training;Therapeutic activities;Therapeutic exercise;Functional mobility training;Balance training;Neuromuscular  re-education;Patient/family education;Manual techniques;Dry needling;Vestibular;Joint Manipulations;Spinal Manipulations    PT Next Visit Plan  ABC/DHI; progress strength and balance    PT Home Exercise Plan  introduce next session    Consulted and Agree with Plan of Care  Patient;Family member/caregiver    Family Member Consulted  2 daughters       Patient will benefit from skilled therapeutic intervention in order to improve the following deficits and impairments:  Abnormal gait, Decreased balance, Decreased endurance, Decreased mobility, Difficulty walking, Decreased knowledge of precautions, Decreased safety awareness, Decreased activity tolerance, Decreased strength, Hypermobility  Visit Diagnosis: Muscle weakness (generalized)  Other lack of coordination  Other abnormalities of gait and mobility  Unsteadiness on feet     Problem List Patient Active Problem List   Diagnosis Date Noted  . 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 Great toe pain, right   . Lethargy   . CKD (chronic kidney disease), stage II   . Leukocytosis   . Benign essential HTN   . New onset atrial fibrillation ([place])   . Dysphagia, post-stroke   . 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 Acute ischemic right MCA stroke ([place]) 05/25/2019  . Goals of care, counseling/discussion   . Palliative care by specialist   . DNR (do not resuscitate) discussion   . Stroke, acute, embolic ([place]) 88/50/2774  . Persistent proteinuria 07/30/2018  . 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 Chronic kidney disease, stage III (moderate) 06/15/2018  . Leg pain 05/06/2018  . Lymphedema 05/06/2018  . Coagulopathy ([place]) 07/07/2017  . Lumbar spondylosis 03/10/2017  . Elevated uric acid in blood 11/20/2016  . BPH (benign prostatic hyperplasia) 05/29/2016  . Peripheral vascular  disease of lower extremity ([place]) 03/03/2016  . Gallstone 12/31/2015  . 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 Splenic  vein thrombosis 11/27/2015  . Atherosclerosis of aorta ([place]) 11/22/2015  . Carotid artery narrowing 11/22/2015  . Diverticulosis of colon 11/22/2015  . Decreased creatinine clearance 11/22/2015  . Arthritis, degenerative 11/22/2015  . Lactose intolerance 11/22/2015  . Basal cell carcinoma 11/22/2015  . Essential hypertension 06/21/2015  . Hyperlipemia 06/21/2015  . GERD (gastroesophageal reflux disease) 06/21/2015  . Calculus of kidney 11/18/2013    Jyssica Rief PT, DPT 10/06/2019, 12:54 PM  [place] MAIN [facility] SERVICES [address] [place], [place], [address] Phone: [phone]   Fax:  [phone]  Name: Jose Castro MRN: [id number] Date of Birth: 1925/12/06

## 2019-10-10 ENCOUNTER — Other Ambulatory Visit: Payer: Self-pay

## 2019-10-10 ENCOUNTER — Ambulatory Visit: Payer: Medicare Other

## 2019-10-10 ENCOUNTER — Ambulatory Visit: Payer: Medicare Other | Admitting: Occupational Therapy

## 2019-10-10 DIAGNOSIS — R2689 Other abnormalities of gait and mobility: Secondary | ICD-10-CM

## 2019-10-10 DIAGNOSIS — M6281 Muscle weakness (generalized): Secondary | ICD-10-CM | POA: Diagnosis not present

## 2019-10-10 DIAGNOSIS — R2681 Unsteadiness on feet: Secondary | ICD-10-CM

## 2019-10-10 DIAGNOSIS — R278 Other lack of coordination: Secondary | ICD-10-CM

## 2019-10-10 NOTE — Therapy (Signed)
[place] MAIN [facility] SERVICES [address] [place], [place], [address] Phone: [phone]   Fax:  [phone]  Physical Therapy Treatment  Patient Details  Name: Jose Castro MRN: [id number] Date of Birth: 1926-02-09 Referring Provider (PT): Dr. Letta Pate   Encounter Date: 10/10/2019  PT End of Session - 10/10/19 1252    Visit Number  4    Number of Visits  25    Date for PT Re-Evaluation  12/14/19    PT Start Time  1300    PT Stop Time  1345    PT Time Calculation (min)  45 min    Equipment Utilized During Treatment  Gait belt    Activity Tolerance  Patient tolerated treatment well    Behavior During Therapy  [facility] for tasks assessed/performed;Impulsive       Past Medical History:  Diagnosis Date  . Arthritis   . BPH (benign prostatic hyperplasia)   . Chronic kidney disease    had a kidney stone which per family was a cyst that was removed  . Dysrhythmia   . GERD (gastroesophageal reflux disease)   . History of kidney stones   . Hyperlipidemia   . Hypertension   . Splenic vein thrombosis   . Stroke ([place]) 05/22/2019  . Thrombosis 12/2015   mural  area and no notation of heart attack    Past Surgical History:  Procedure Laterality Date  . APPENDECTOMY    . CATARACT EXTRACTION, BILATERAL    . CYSTOSCOPY WITH INSERTION OF UROLIFT    . KIDNEY STONE SURGERY    . KNEE SURGERY    . PACEMAKER INSERTION N/A 07/13/2018   Procedure: INSERTION PACEMAKER-DUEL CHAMBER INITIAL IMPLANT;  Surgeon: Isaias Cowman, MD;  Location: [facility] ORS;  Service: Cardiovascular;  Laterality: N/A;  . SPINE SURGERY      There were no vitals filed for this visit.  Subjective Assessment - 10/10/19 1304    Subjective  Patient reports doing well, no soreness or pain today; Reports adherence with HEP;    Patient is accompained by:  Family member    Pertinent History  Pt is a 85 year old male who presents with imbalance and difficulty with gait following a  right frontal CVA with left hemiparesis on 05/22/19.  He has completed inpatient and home health PT, and now presents for OP PT.  He was discharged from inpatient rehab at a supervision level of assistance, ambulating with a RW.  His family notes that he is impulsive and will try to ambulate without his RW at home.  Family is currently providing 24/7 supervision.    Limitations  House hold activities;Walking    How long can you sit comfortably?  no limitations    How long can you walk comfortably?  he requires a RW at home    Patient Stated Goals  get back to normal; want to be able to play golf and go out to play trivia again.  His daughters want him to regain independence with going to the bathroom at night    Currently in Pain?  No/denies        TREATMENT   Therapeutic Exercise:   STSs from standard chair without UE support 2x10; CGA throughout  Side steps with red tband x4 lengths in each direction    Neuromuscular Reeducation:   Marching on airex 2x10 with BUE support on // bars  Step ups from airex pad onto 6" box with airex pad on top 2x10 with each  LE; BUE support throughout in // bars  Semitandem balance on airex EO 2x45 sec with each foot in front without UE support   foam roll heel to toe rocking x10 in each direction with BUE support throughout  Semi tandem gait in // bars on airex balance beam x5 lengths in each direction  wth SUE support throughout  Side steps on airex balance beam in // bars x5 lengths in each direction with SUE support throughout  SL balance attempted on firm ground; cannot maintain on LLE, but can maintain for up to 2s on RLE     Pt educated throughout session about proper posture and technique with exercises. Improved exercise technique, movement at target joints, use of target muscles after min to mod verbal, visual, tactile cues.      Pt demonstrated good motivation throughout today's session.  He requires intermittent standing rest  breaks throughout, but requires less seated rest breaks today.  He continues to benefit from min-mod verbal cueing throughout for technique and staying on task.  He demonstrated the ability to perform side stepping and tandem balance beam exercises today with SUE support, requiring less UE support for steadying.  Pt will benefit from skilled PT services to address deficits in balance and decrease risk for future falls.                              PT Short Term Goals - 09/23/19 1052      PT SHORT TERM GOAL #1   Title  Pt will be independent with HEP in order to improve strength and balance in order to decrease fall risk and improve function at home and work.    Time  6    Period  Weeks    Status  New    Target Date  11/02/19        PT Long Term Goals - 09/23/19 1053      PT LONG TERM GOAL #1   Title  Pt will improve BERG to >45 in order to decrease his fall risk.    Baseline  09/21/19: 42/56    Time  12    Period  Weeks    Status  New    Target Date  12/14/19      PT LONG TERM GOAL #2   Title  Pt will decrease TUG to below 14 seconds/decrease in order to demonstrate decreased fall risk.    Baseline  09/21/19: 22.19s    Time  12    Period  Weeks    Status  New    Target Date  12/14/19      PT LONG TERM GOAL #3   Title  Pt will decrease 5TSTS by at least 3 seconds in order to demonstrate clinically significant improvement in LE strength.    Baseline  09/21/19: 32.28s    Time  12    Period  Weeks    Status  New    Target Date  12/14/19      PT LONG TERM GOAL #4   Title  Pt will increase his 10MWT by at least 0.13 m/s in order to demonstrate a clinically significant improvement in gait speed.    Baseline  09/21/19: Self-selected: 20.9 s = 0.48 m/s; Fastest: 17.1s =0.58 m/s    Time  12    Period  Weeks    Status  New    Target Date  12/14/19  Plan - 10/10/19 1614    Clinical Impression Statement  Pt demonstrated good motivation  throughout today's session.  He requires intermittent standing rest breaks throughout, but requires less seated rest breaks today.  He continues to benefit from min-mod verbal cueing throughout for technique and staying on task.  He demonstrated the ability to perform side stepping and tandem balance beam exercises today with SUE support, requiring less UE support for steadying.  Pt will benefit from skilled PT services to address deficits in balance and decrease risk for future falls.    Personal Factors and Comorbidities  Age;Comorbidity 2    Comorbidities  HTN, CVA    Examination-Activity Limitations  Bend;Lift;Squat;Locomotion Level;Stairs;Stand    Examination-Participation Restrictions  Community Activity;Driving    Stability/Clinical Decision Making  Evolving/Moderate complexity    Rehab Potential  Good    PT Frequency  2x / week    PT Duration  12 weeks    PT Treatment/Interventions  ADLs/Self Care Home Management;Canalith Repostioning;Cryotherapy;Electrical Stimulation;Iontophoresis 4mg /ml Dexamethasone;Moist Heat;Traction;Ultrasound;DME Instruction;Gait training;Stair training;Therapeutic activities;Therapeutic exercise;Functional mobility training;Balance training;Neuromuscular re-education;Patient/family education;Manual techniques;Dry needling;Vestibular;Joint Manipulations;Spinal Manipulations    PT Next Visit Plan  ABC/DHI; progress strength and balance    PT Home Exercise Plan  introduce next session    Consulted and Agree with Plan of Care  Patient;Family member/caregiver    Family Member Consulted  pt's son       Patient will benefit from skilled therapeutic intervention in order to improve the following deficits and impairments:  Abnormal gait, Decreased balance, Decreased endurance, Decreased mobility, Difficulty walking, Decreased knowledge of precautions, Decreased safety awareness, Decreased activity tolerance, Decreased strength, Hypermobility  Visit Diagnosis: Muscle  weakness (generalized)  Unsteadiness on feet  Other abnormalities of gait and mobility     Problem List Patient Active Problem List   Diagnosis Date Noted  . 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 Great toe pain, right   . Lethargy   . CKD (chronic kidney disease), stage II   . Leukocytosis   . Benign essential HTN   . New onset atrial fibrillation ([place])   . Dysphagia, post-stroke   . 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 Acute ischemic right MCA stroke ([place]) 05/25/2019  . Goals of care, counseling/discussion   . Palliative care by specialist   . DNR (do not resuscitate) discussion   . Stroke, acute, embolic ([place]) 54/08/8118  . Persistent proteinuria 07/30/2018  . 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 Chronic kidney disease, stage III (moderate) 06/15/2018  . Leg pain 05/06/2018  . Lymphedema 05/06/2018  . Coagulopathy ([place]) 07/07/2017  . Lumbar spondylosis 03/10/2017  . Elevated uric acid in blood 11/20/2016  . BPH (benign prostatic hyperplasia) 05/29/2016  . Peripheral vascular disease of lower extremity ([place]) 03/03/2016  . Gallstone 12/31/2015  . 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 Atherosclerosis of aorta ([place]) 11/22/2015  . Carotid artery narrowing 11/22/2015  . Diverticulosis of colon 11/22/2015  . Decreased creatinine clearance 11/22/2015  . Arthritis, degenerative 11/22/2015  . Lactose intolerance 11/22/2015  . Basal cell carcinoma 11/22/2015  . Essential hypertension 06/21/2015  . Hyperlipemia 06/21/2015  . GERD (gastroesophageal reflux disease) 06/21/2015  . Calculus of kidney 11/18/2013    Phillips Grout PT, DPT, GCS   Lutricia Horsfall, SPT Phillips Grout PT, DPT, GCS  Huprich,Jason 10/11/2019, 1:46 PM  [place] MAIN [facility] SERVICES [place],  [place], [address] Phone: [phone]   Fax:  [phone]  Name: EBERARDO DEMELLO MRN: [id number] Date of Birth: [birth year]

## 2019-10-11 ENCOUNTER — Encounter: Payer: Self-pay | Admitting: Occupational Therapy

## 2019-10-11 NOTE — Therapy (Signed)
[place] MAIN [facility] SERVICES [address] [place], [place], [address] Phone: [phone]   Fax:  [phone]  Occupational Therapy Treatment  Patient Details  Name: Jose Castro MRN: [id number] Date of Birth: October 08, 1926 Referring Provider (OT): Steele Sizer   Encounter Date: 10/10/2019  OT End of Session - 10/11/19 1732    Visit Number  4    Number of Visits  24    Date for OT Re-Evaluation  12/14/19    OT Start Time  1345    OT Stop Time  1430    OT Time Calculation (min)  45 min    Activity Tolerance  Patient tolerated treatment well    Behavior During Therapy  [facility] for tasks assessed/performed;Impulsive       Past Medical History:  Diagnosis Date  . Arthritis   . BPH (benign prostatic hyperplasia)   . Chronic kidney disease    had a kidney stone which per family was a cyst that was removed  . Dysrhythmia   . GERD (gastroesophageal reflux disease)   . History of kidney stones   . Hyperlipidemia   . Hypertension   . Splenic vein thrombosis   . Stroke ([place]) 05/22/2019  . Thrombosis 12/2015   mural  area and no notation of heart attack    Past Surgical History:  Procedure Laterality Date  . APPENDECTOMY    . CATARACT EXTRACTION, BILATERAL    . CYSTOSCOPY WITH INSERTION OF UROLIFT    . KIDNEY STONE SURGERY    . KNEE SURGERY    . PACEMAKER INSERTION N/A 07/13/2018   Procedure: INSERTION PACEMAKER-DUEL CHAMBER INITIAL IMPLANT;  Surgeon: Isaias Cowman, MD;  Location: [facility] ORS;  Service: Cardiovascular;  Laterality: N/A;  . SPINE SURGERY      There were no vitals filed for this visit.  Subjective Assessment - 10/11/19 1730    Subjective   Son present today for patient's session.  Patient reports he is not sure what things he would have difficulty with if his kids were not taking turns helping him out at home.   States, "They walk behind me to make sure I get to where I need to go."    Pertinent History  Pt. is a 85  y.o. male who was admitted to [facility] with a [facility] Infarct on 05/21/2019. Pt. went through Inpatient Rehab, and had home health OT, PT, and ST services. Pt. was previously residing home alone, and was independent with ADLs, and IADLs. Pt. now has family staying with the pt. around the clock.    Patient Stated Goals  to regain functional indpendence safely    Currently in Pain?  No/denies    Pain Score  0-No pain       Therapeutic Activity: Visual scanning tasks at tabletop with scatter pile of deck of cards (52 cards), patient directed to start with one suit and find all cards in ascending order, continued for all 4 suits of cards.  Patient was able to complete task with good timing, scanning and no difficulty with finding any specific cards.  Patient demonstrating difficulty with shuffling of cards with decreased control on left side and decreased hand coordination skills. Able to demonstrate flipping of cards with left hand but does demonstrate decreased speed and accuracy of task.  Therapeutic Exercise: Grip strengthening tasks, one round of 25 repetitions with 23.4# with emphasis on sustained gripping patterns to pick up items and move item to place into container.  Cues for hand positioning on gripper and for sustained gripping patterns.  Patient reported he felt the setting was fairly "easy" therefore we advanced to the next level of 28# however patient could only perform 3-4 reps before becoming fatigued.  Therapist readjusted setting to perform another set of 23.4# and patient could perform about 18 reps before having difficulty with the remaining.  Patient was determined to finish despite repeated dropping of item.  Therapist had patient rest and then complete task.    Discussed tasks that are potentially difficult for patient however he had a difficult time self identifying tasks.  Requested he think about this over the next few days and to formulate a list if possible  with assist from family.        Response to tx:   Patient indicating at the beginning of the session that OT is not his favorite therapy compared to PT.  He identifies the need to work on balance and walking but is not always able to see the value of OT tasks and the connection of impairments to his daily function.  Patient and son confirm that patient is highly competitive and gets frustrated with tasks he is unable to complete the way he expects he should.  Patient had difficulty today with moving up to 28# for grip strength and was unable to understand that his hand is lacking strength, he attributed the reason to the items to pick up being too short.  He could demonstrate ability with right unaffected hand.             OT Education - 10/11/19 1731    Education Details  grip strength, vision    Person(s) Educated  Patient    Methods  Explanation;Demonstration    Comprehension  Verbalized understanding;Returned demonstration;Verbal cues required          OT Long Term Goals - 09/21/19 1726      OT LONG TERM GOAL #1   Title  Pt. will demonstrate home management tasks with supervision initating safe strategies    Baseline  Eval: Decreased safety awareness    Time  12    Period  Weeks    Status  New    Target Date  12/14/19      OT LONG TERM GOAL #2   Title  Pt. will be able to identify 100% of potential safety hazards in a simulated home environment during ADLs, and IADLs.    Baseline  Eval: Limited safety awareness    Time  12    Period  Weeks    Status  New    Target Date  12/14/19      OT LONG TERM GOAL #3   Title  Pt. will increase left grip strength by 5# of force to assist with holding items duirng ADLs, and IADLs.    Baseline  Eval: decreased left grip strength    Time  12    Period  Weeks    Status  New    Target Date  12/14/19      OT LONG TERM GOAL #4   Title  Pt. will improve left hand [facility] skills by 3 sec of speed to be able to manipulate objects  during ADLs, and IADL tasks.    Baseline  Eval: Pt. has difficulty    Time  12    Period  Weeks    Target Date  12/14/19      OT LONG TERM GOAL #5   Title  Pt. will demonstrate visual compensatory strategies as needed during ADLs, and IADL tasks.    Baseline  Eval: Limited left sided awareness    Time  12    Period  Weeks    Status  New    Target Date  12/14/19      Long Term Additional Goals   Additional Long Term Goals  Yes      OT LONG TERM GOAL #6   Title  Pt. will demonstrate cognitive compensatory strategies during ADLs, and IADLs    Baseline  Eval: Limited cognition/problem solving, impulsive    Time  12    Period  Weeks    Status  New    Target Date  12/14/19            Plan - 10/11/19 1732    Clinical Impression Statement  Patient indicating at the beginning of the session that OT is not his favorite therapy compared to PT.  He identifies the need to work on balance and walking but is not always able to see the value of OT tasks and the connection of impairments to his daily function.  Patient and son confirm that patient is highly competitive and gets frustrated with tasks he is unable to complete the way he expects he should.  Patient had difficulty today with moving up to 28# for grip strength and was unable to understand that his hand is lacking strength, he attributed the reason to the items to pick up being too short.  He could demonstrate ability with right unaffected hand.    OT Occupational Profile and History  Detailed Assessment- Review of Records and additional review of physical, cognitive, psychosocial history related to current functional performance    Occupational performance deficits (Please refer to evaluation for details):  ADL's;IADL's    Body Structure / Function / Physical Skills  ADL;FMC;ROM;IADL;Strength;Decreased knowledge of use of DME;UE functional use    Rehab Potential  Excellent    Clinical Decision Making  Several treatment options,  min-mod task modification necessary    Comorbidities Affecting Occupational Performance:  May have comorbidities impacting occupational performance    Modification or Assistance to Complete Evaluation   Min-Moderate modification of tasks or assist with assess necessary to complete eval    OT Frequency  2x / week    OT Duration  12 weeks    OT Treatment/Interventions  Self-care/ADL training;DME and/or AE instruction;Therapeutic exercise;Neuromuscular education;Patient/family education;Therapeutic activities       Patient will benefit from skilled therapeutic intervention in order to improve the following deficits and impairments:   Body Structure / Function / Physical Skills: ADL, FMC, ROM, IADL, Strength, Decreased knowledge of use of DME, UE functional use       Visit Diagnosis: Muscle weakness (generalized)  Unsteadiness on feet  Other lack of coordination    Problem List Patient Active Problem List   Diagnosis Date Noted  . 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 Great toe pain, right   . Lethargy   . CKD (chronic kidney disease), stage II   . Leukocytosis   . Benign essential HTN   . New onset atrial fibrillation ([place])   . Dysphagia, post-stroke   . 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 Acute ischemic right MCA stroke ([place]) 05/25/2019  . Goals of care, counseling/discussion   . Palliative care by specialist   . DNR (do not resuscitate) discussion   . Stroke, acute, embolic ([place]) 57/32/2025  .  Persistent proteinuria 07/30/2018  . 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 Chronic kidney disease, stage III (moderate) 06/15/2018  . Leg pain 05/06/2018  . Lymphedema 05/06/2018  . Coagulopathy ([place]) 07/07/2017  . Lumbar spondylosis 03/10/2017  . Elevated uric acid in blood 11/20/2016  . BPH (benign prostatic hyperplasia) 05/29/2016  . Peripheral vascular disease of lower extremity ([place]) 03/03/2016  . Gallstone 12/31/2015  .  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 Atherosclerosis of aorta ([place]) 11/22/2015  . Carotid artery narrowing 11/22/2015  . Diverticulosis of colon 11/22/2015  . Decreased creatinine clearance 11/22/2015  . Arthritis, degenerative 11/22/2015  . Lactose intolerance 11/22/2015  . Basal cell carcinoma 11/22/2015  . Essential hypertension 06/21/2015  . Hyperlipemia 06/21/2015  . GERD (gastroesophageal reflux disease) 06/21/2015  . Calculus of kidney 11/18/2013   Achilles Dunk, OTR/L, CLT  Lovett,Amy 10/11/2019, 5:49 PM  [place] MAIN [facility] SERVICES [address] [place], [place], [address] Phone: [phone]   Fax:  [phone]  Name: BABAK LUCUS MRN: [id number] Date of Birth: September 18, 1926

## 2019-10-12 ENCOUNTER — Encounter: Payer: Self-pay | Admitting: Physical Therapy

## 2019-10-12 ENCOUNTER — Other Ambulatory Visit: Payer: Self-pay

## 2019-10-12 ENCOUNTER — Ambulatory Visit: Payer: Medicare Other | Admitting: Occupational Therapy

## 2019-10-12 ENCOUNTER — Ambulatory Visit: Payer: Medicare Other | Admitting: Physical Therapy

## 2019-10-12 ENCOUNTER — Encounter: Payer: Self-pay | Admitting: Occupational Therapy

## 2019-10-12 DIAGNOSIS — R2681 Unsteadiness on feet: Secondary | ICD-10-CM

## 2019-10-12 DIAGNOSIS — M6281 Muscle weakness (generalized): Secondary | ICD-10-CM

## 2019-10-12 DIAGNOSIS — R2689 Other abnormalities of gait and mobility: Secondary | ICD-10-CM

## 2019-10-12 DIAGNOSIS — R278 Other lack of coordination: Secondary | ICD-10-CM

## 2019-10-12 NOTE — Therapy (Signed)
[place] MAIN [facility] SERVICES [address] [place], [place], [address] Phone: [phone]   Fax:  [phone]  Physical Therapy Treatment  Patient Details  Name: Jose Castro MRN: [id number] Date of Birth: 1926-06-17 Referring Provider (PT): Dr. Letta Pate   Encounter Date: 10/12/2019  PT End of Session - 10/12/19 1303    Visit Number  5    Number of Visits  25    Date for PT Re-Evaluation  12/14/19    PT Start Time  1302    PT Stop Time  1345    PT Time Calculation (min)  43 min    Equipment Utilized During Treatment  Gait belt    Activity Tolerance  Patient tolerated treatment well    Behavior During Therapy  [facility] for tasks assessed/performed;Impulsive       Past Medical History:  Diagnosis Date  . Arthritis   . BPH (benign prostatic hyperplasia)   . Chronic kidney disease    had a kidney stone which per family was a cyst that was removed  . Dysrhythmia   . GERD (gastroesophageal reflux disease)   . History of kidney stones   . Hyperlipidemia   . Hypertension   . Splenic vein thrombosis   . Stroke ([place]) 05/22/2019  . Thrombosis 12/2015   mural  area and no notation of heart attack    Past Surgical History:  Procedure Laterality Date  . APPENDECTOMY    . CATARACT EXTRACTION, BILATERAL    . CYSTOSCOPY WITH INSERTION OF UROLIFT    . KIDNEY STONE SURGERY    . KNEE SURGERY    . PACEMAKER INSERTION N/A 07/13/2018   Procedure: INSERTION PACEMAKER-DUEL CHAMBER INITIAL IMPLANT;  Surgeon: Isaias Cowman, MD;  Location: [facility] ORS;  Service: Cardiovascular;  Laterality: N/A;  . SPINE SURGERY      There were no vitals filed for this visit.  Subjective Assessment - 10/12/19 1311    Subjective  Patient reports increased discomfort in LLE knee; He does have a knee brace but he didn't wear it today.    Patient is accompained by:  Family member    Pertinent History  Pt is a 85 year old male who presents with imbalance and  difficulty with gait following a right frontal CVA with left hemiparesis on 05/22/19.  He has completed inpatient and home health PT, and now presents for OP PT.  He was discharged from inpatient rehab at a supervision level of assistance, ambulating with a RW.  His family notes that he is impulsive and will try to ambulate without his RW at home.  Family is currently providing 24/7 supervision.    Limitations  House hold activities;Walking    How long can you sit comfortably?  no limitations    How long can you walk comfortably?  he requires a RW at home    Patient Stated Goals  get back to normal; want to be able to play golf and go out to play trivia again.  His daughters want him to regain independence with going to the bathroom at night    Currently in Pain?  Yes    Pain Score  2     Pain Location  Knee    Pain Orientation  Left    Pain Descriptors / Indicators  Aching;Sore    Pain Type  Acute pain    Pain Onset  Today    Pain Frequency  Intermittent    Aggravating Factors   weight bearing  Pain Relieving Factors  rest/knee brace    Effect of Pain on Daily Activities  decreased activity tolerance;    Multiple Pain Castro  No             Treatment: Gait in hallway x4 laps without AD, CGA to min A with antalgic gait pattern with short stance time on LLE and decreased step length RLE; Patient required cues to increase base of support to avoid scissoring and to increase step length for better gait safety;   NMR: Instructed patient in balance exercise: Standing on airex foam: Heel/toe raises x15 reps with 2-1-0 rail assist for safety with cues to keep hips in neutral for better ankle AROM for improved ankle strategies; Feet together:             Eyes open/closed 20 sec hold x3 reps each with CGA to min A for safety and cues to reorient to midline for better stance control;  Standing on airex: Modified tandem stance:             Unsupported standing 15 sec hold x   Reps each  foot in front             Head turns side/side, up/down x5 reps each with CGA for safety;             Alternate UE lift x5 reps each UE Standing on airex: Standing one foot on airex, one foot on 6 inch step unsupported 15 sec hold x1 rep each foot on step; Progressing to head turns side/side, up/down x5 reps each foot on step to challenge stance control and improve LE strength; required min A for safety;     Exercise: Standing with red tband around BLE: -hip abduction x10 -hip extension x10 -hip flexion x10 -Side stepping x10 feet x2 laps each direction with finger tip hold and cues to increase step length for better hip strengthening;  Patient required min-moderate verbal/tactile cues for correct exercise technique including cues to keep knee straight and improve weight shift and trunk control for better hip strengthening;   Finished session on Nustep BUE/BLE level 2 x4 min (Unbilled);  Response to treatment: Tolerated well. Patient does fatigue with prolonged standing requiring intermittent seated rest breaks. He requires CGA to min A with advanced balance tasks and min-mod VCs for proper stance control/weight shift. He is impulsive and hard of hearing making learning new tasks challenging. Reinforced HEP at end of session;                      PT Education - 10/12/19 1303    Education Details  LE strength, balance, HEP reinforced;    Person(s) Educated  Patient    Methods  Explanation;Verbal cues    Comprehension  Verbalized understanding;Returned demonstration;Verbal cues required;Need further instruction       PT Short Term Goals - 09/23/19 1052      PT SHORT TERM GOAL #1   Title  Pt will be independent with HEP in order to improve strength and balance in order to decrease fall risk and improve function at home and work.    Time  6    Period  Weeks    Status  New    Target Date  11/02/19        PT Long Term Goals - 09/23/19 1053      PT LONG TERM  GOAL #1   Title  Pt will improve BERG to >45 in order to decrease  his fall risk.    Baseline  09/21/19: 42/56    Time  12    Period  Weeks    Status  New    Target Date  12/14/19      PT LONG TERM GOAL #2   Title  Pt will decrease TUG to below 14 seconds/decrease in order to demonstrate decreased fall risk.    Baseline  09/21/19: 22.19s    Time  12    Period  Weeks    Status  New    Target Date  12/14/19      PT LONG TERM GOAL #3   Title  Pt will decrease 5TSTS by at least 3 seconds in order to demonstrate clinically significant improvement in LE strength.    Baseline  09/21/19: 32.28s    Time  12    Period  Weeks    Status  New    Target Date  12/14/19      PT LONG TERM GOAL #4   Title  Pt will increase his 10MWT by at least 0.13 m/s in order to demonstrate a clinically significant improvement in gait speed.    Baseline  09/21/19: Self-selected: 20.9 s = 0.48 m/s; Fastest: 17.1s =0.58 m/s    Time  12    Period  Weeks    Status  New    Target Date  12/14/19            Plan - 10/12/19 1446    Clinical Impression Statement  Patient motivated and participated well within session. He does exhibit antalgic gait pattern this session due to slight left knee pain which is likely related to OA from wet weather. He reports adherence with HEP a few days a week. Instructed patient in advanced balance exercise utilizing compliant surfaces to challenge stance control. Patient does require CGA to min A throughout session for safety and cues for proper weight shift to improve balance. He does fatigue quickly requiring intermittent rest breaks. Patient instructed in advanced LE strengthening, utilizing resistance band for strengthening. He does require min VCs for proper positioning to improve strength. Patient continues to be impulsive requiring CGA throughout session for safety awareness. Patient would benefit from additional skilled PT intervention to improve strength, balance and gait  safety;    Personal Factors and Comorbidities  Age;Comorbidity 2    Comorbidities  HTN, CVA    Examination-Activity Limitations  Bend;Lift;Squat;Locomotion Level;Stairs;Stand    Examination-Participation Restrictions  Community Activity;Driving    Stability/Clinical Decision Making  Evolving/Moderate complexity    Rehab Potential  Good    PT Frequency  2x / week    PT Duration  12 weeks    PT Treatment/Interventions  ADLs/Self Care Home Management;Canalith Repostioning;Cryotherapy;Electrical Stimulation;Iontophoresis 4mg /ml Dexamethasone;Moist Heat;Traction;Ultrasound;DME Instruction;Gait training;Stair training;Therapeutic activities;Therapeutic exercise;Functional mobility training;Balance training;Neuromuscular re-education;Patient/family education;Manual techniques;Dry needling;Vestibular;Joint Manipulations;Spinal Manipulations    PT Next Visit Plan  ABC/DHI; progress strength and balance    PT Home Exercise Plan  introduce next session    Consulted and Agree with Plan of Care  Patient;Family member/caregiver    Family Member Consulted  pt's son       Patient will benefit from skilled therapeutic intervention in order to improve the following deficits and impairments:  Abnormal gait, Decreased balance, Decreased endurance, Decreased mobility, Difficulty walking, Decreased knowledge of precautions, Decreased safety awareness, Decreased activity tolerance, Decreased strength, Hypermobility  Visit Diagnosis: Muscle weakness (generalized)  Unsteadiness on feet  Other lack of coordination  Other abnormalities of gait and mobility  Problem List Patient Active Problem List   Diagnosis Date Noted  . 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 Great toe pain, right   . Lethargy   . CKD (chronic kidney disease), stage II   . Leukocytosis   . Benign essential HTN   . New onset atrial fibrillation ([place])   . Dysphagia, post-stroke   . Right middle cerebral artery stroke  ([place]) 05/27/2019  . Acute ischemic right MCA stroke ([place]) 05/25/2019  . Goals of care, counseling/discussion   . Palliative care by specialist   . DNR (do not resuscitate) discussion   . Stroke, acute, embolic ([place]) 65/01/5464  . Persistent proteinuria 07/30/2018  . 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 Chronic kidney disease, stage III (moderate) 06/15/2018  . Leg pain 05/06/2018  . Lymphedema 05/06/2018  . Coagulopathy ([place]) 07/07/2017  . Lumbar spondylosis 03/10/2017  . Elevated uric acid in blood 11/20/2016  . BPH (benign prostatic hyperplasia) 05/29/2016  . Peripheral vascular disease of lower extremity ([place]) 03/03/2016  . Gallstone 12/31/2015  . 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 Atherosclerosis of aorta ([place]) 11/22/2015  . Carotid artery narrowing 11/22/2015  . Diverticulosis of colon 11/22/2015  . Decreased creatinine clearance 11/22/2015  . Arthritis, degenerative 11/22/2015  . Lactose intolerance 11/22/2015  . Basal cell carcinoma 11/22/2015  . Essential hypertension 06/21/2015  . Hyperlipemia 06/21/2015  . GERD (gastroesophageal reflux disease) 06/21/2015  . Calculus of kidney 11/18/2013    Trotter,Margaret PT, DPT 10/12/2019, 2:49 PM  [place] MAIN [facility] SERVICES [address] [place], [place], [address] Phone: [phone]   Fax:  [phone]  Name: Jose Castro MRN: [id number] Date of Birth: 05-Aug-1926

## 2019-10-12 NOTE — Therapy (Signed)
[place] MAIN [facility] SERVICES [address] [place], [place], [address] Phone: [phone]   Fax:  [phone]  Occupational Therapy Treatment  Patient Details  Name: Jose Castro MRN: [id number] Date of Birth: 1926-01-06 Referring Provider (OT): Steele Sizer   Encounter Date: 10/12/2019  OT End of Session - 10/12/19 1744    Visit Number  5    Number of Visits  24    Date for OT Re-Evaluation  12/14/19    OT Start Time  1350    OT Stop Time  1430    OT Time Calculation (min)  40 min    Activity Tolerance  Patient tolerated treatment well    Behavior During Therapy  [facility] for tasks assessed/performed;Impulsive       Past Medical History:  Diagnosis Date  . Arthritis   . BPH (benign prostatic hyperplasia)   . Chronic kidney disease    had a kidney stone which per family was a cyst that was removed  . Dysrhythmia   . GERD (gastroesophageal reflux disease)   . History of kidney stones   . Hyperlipidemia   . Hypertension   . Splenic vein thrombosis   . Stroke ([place]) 05/22/2019  . Thrombosis 12/2015   mural  area and no notation of heart attack    Past Surgical History:  Procedure Laterality Date  . APPENDECTOMY    . CATARACT EXTRACTION, BILATERAL    . CYSTOSCOPY WITH INSERTION OF UROLIFT    . KIDNEY STONE SURGERY    . KNEE SURGERY    . PACEMAKER INSERTION N/A 07/13/2018   Procedure: INSERTION PACEMAKER-DUEL CHAMBER INITIAL IMPLANT;  Surgeon: Isaias Cowman, MD;  Location: [facility] ORS;  Service: Cardiovascular;  Laterality: N/A;  . SPINE SURGERY      There were no vitals filed for this visit.  Subjective Assessment - 10/12/19 1743    Subjective   Pt.'s daughter Jose Castro was present during the session.    Pertinent History  Pt. is a 85 y.o. male who was admitted to [facility] with a [facility] DIstribution Infarct on 05/21/2019. Pt. went through Inpatient Rehab, and had home health OT, PT, and ST services. Pt. was  previously residing home alone, and was independent with ADLs, and IADLs. Pt. now has family staying with the pt. around the clock.    Currently in Pain?  No/denies       OT TREATMENT    Selfcare:  Reviewed IADL kitchen safety awareness, and judgement using the walker with a walker tray. Pt. Required cues, and redirection as pt. Perseverated on the cup holder not being accessible/large enough to hold his beverage. Pt. Required cues for the position on the tray with the walker with reaching up to obtain items from the cabinets, and using the walker when transporting items. Pt. Could benefit from dycem, or nonskid mat on the tray to prevent items from sliding. Education was provided about using the walker, safety with the walker in the kitchen, as well as opening/closing doors when using the walker. Pt. Was able to position the walker to open the door, however was unsafe when reaching to close the door.   Pt. requires increased cues for safety awareness, and judgement during tasks in the kitchen. Pt. often requires cues for redirection. At times the pt. is not agreeable to the safety recommendations,as pt. continues to justify rationale for peforming the tasks the way he did before the CVA. Pt. requires cues, and education for safety awareness  with the walker when reaching into high, and low cabinets to access items with one or both hands, using the walker tray, trransporting items in the kitchen, walker placement when opening, and closing doors. At the end of the session the pt. was able to identify reasons for reviewing safety with the walker in the kitchen.                         OT Education - 10/12/19 1744    Education Details  Kitchen mobility, and Psychiatric nurse) Educated  Patient    Methods  Explanation;Demonstration    Comprehension  Verbalized understanding;Returned demonstration;Verbal cues required          OT Long Term Goals - 09/21/19 1726      OT LONG  TERM GOAL #1   Title  Pt. will demonstrate home management tasks with supervision initating safe strategies    Baseline  Eval: Decreased safety awareness    Time  12    Period  Weeks    Status  New    Target Date  12/14/19      OT LONG TERM GOAL #2   Title  Pt. will be able to identify 100% of potential safety hazards in a simulated home environment during ADLs, and IADLs.    Baseline  Eval: Limited safety awareness    Time  12    Period  Weeks    Status  New    Target Date  12/14/19      OT LONG TERM GOAL #3   Title  Pt. will increase left grip strength by 5# of force to assist with holding items duirng ADLs, and IADLs.    Baseline  Eval: decreased left grip strength    Time  12    Period  Weeks    Status  New    Target Date  12/14/19      OT LONG TERM GOAL #4   Title  Pt. will improve left hand [employer] skills by 3 sec of speed to be able to manipulate objects during ADLs, and IADL tasks.    Baseline  Eval: Pt. has difficulty    Time  12    Period  Weeks    Target Date  12/14/19      OT LONG TERM GOAL #5   Title  Pt. will demonstrate visual compensatory strategies as needed during ADLs, and IADL tasks.    Baseline  Eval: Limited left sided awareness    Time  12    Period  Weeks    Status  New    Target Date  12/14/19      Long Term Additional Goals   Additional Long Term Goals  Yes      OT LONG TERM GOAL #6   Title  Pt. will demonstrate cognitive compensatory strategies during ADLs, and IADLs    Baseline  Eval: Limited cognition/problem solving, impulsive    Time  12    Period  Weeks    Status  New    Target Date  12/14/19            Plan - 10/12/19 1745    Clinical Impression Statement  Pt. requires increased cues for safety awareness, and judgement during tasks in the kitchen. Pt. often requires cues for redirection. At times the pt. is not agreeable to the safety recommendations,as pt. continues to justify rationale for peforming the tasks the way he did  before the CVA. Pt. requires cues, and education for safety awareness with the walker when reaching into high, and low cabinets to access items with one or both hands, using the walker tray, trransporting items in the kitchen, walker placement when opening, and closing doors. At the end of the session the pt. was able to identify reasons for reviewing safety with the walker in the kitchen.    OT Occupational Profile and History  Detailed Assessment- Review of Records and additional review of physical, cognitive, psychosocial history related to current functional performance    Occupational performance deficits (Please refer to evaluation for details):  ADL's;IADL's    Body Structure / Function / Physical Skills  ADL;FMC;ROM;IADL;Strength;Decreased knowledge of use of DME;UE functional use    Clinical Decision Making  Several treatment options, min-mod task modification necessary    Comorbidities Affecting Occupational Performance:  May have comorbidities impacting occupational performance    Modification or Assistance to Complete Evaluation   Min-Moderate modification of tasks or assist with assess necessary to complete eval    OT Frequency  2x / week    OT Duration  12 weeks    OT Treatment/Interventions  Self-care/ADL training;DME and/or AE instruction;Therapeutic exercise;Neuromuscular education;Patient/family education;Therapeutic activities    Consulted and Agree with Plan of Care  Patient       Patient will benefit from skilled therapeutic intervention in order to improve the following deficits and impairments:   Body Structure / Function / Physical Skills: ADL, FMC, ROM, IADL, Strength, Decreased knowledge of use of DME, UE functional use       Visit Diagnosis: Muscle weakness (generalized)  Other lack of coordination    Problem List Patient Active Problem List   Diagnosis Date Noted  . 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 Great toe pain, right   . Lethargy   . CKD  (chronic kidney disease), stage II   . Leukocytosis   . Benign essential HTN   . New onset atrial fibrillation ([place])   . Dysphagia, post-stroke   . 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 Acute ischemic right MCA stroke ([place]) 05/25/2019  . Goals of care, counseling/discussion   . Palliative care by specialist   . DNR (do not resuscitate) discussion   . Stroke, acute, embolic ([place]) 62/37/6283  . Persistent proteinuria 07/30/2018  . 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 Chronic kidney disease, stage III (moderate) 06/15/2018  . Leg pain 05/06/2018  . Lymphedema 05/06/2018  . Coagulopathy ([place]) 07/07/2017  . Lumbar spondylosis 03/10/2017  . Elevated uric acid in blood 11/20/2016  . BPH (benign prostatic hyperplasia) 05/29/2016  . Peripheral vascular disease of lower extremity ([place]) 03/03/2016  . Gallstone 12/31/2015  . 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 Atherosclerosis of aorta ([place]) 11/22/2015  . Carotid artery narrowing 11/22/2015  . Diverticulosis of colon 11/22/2015  . Decreased creatinine clearance 11/22/2015  . Arthritis, degenerative 11/22/2015  . Lactose intolerance 11/22/2015  . Basal cell carcinoma 11/22/2015  . Essential hypertension 06/21/2015  . Hyperlipemia 06/21/2015  . GERD (gastroesophageal reflux disease) 06/21/2015  . Calculus of kidney 11/18/2013    Harrel Carina, MS, OTR/L 10/12/2019, 6:02 PM  [place] MAIN [facility] SERVICES [address] [place], [place], [address] Phone: [phone]   Fax:  [phone]  Name: MALIQ PILLEY MRN: [id number] Date of Birth: 04/09/1926

## 2019-10-17 ENCOUNTER — Other Ambulatory Visit: Payer: Self-pay

## 2019-10-17 ENCOUNTER — Encounter: Payer: Self-pay | Admitting: Adult Health

## 2019-10-17 ENCOUNTER — Ambulatory Visit (INDEPENDENT_AMBULATORY_CARE_PROVIDER_SITE_OTHER): Payer: Medicare Other | Admitting: Adult Health

## 2019-10-17 ENCOUNTER — Ambulatory Visit: Payer: Medicare Other

## 2019-10-17 VITALS — BP 139/83 | HR 84 | Temp 97.4°F | Ht 70.5 in | Wt 173.8 lb

## 2019-10-17 DIAGNOSIS — I10 Essential (primary) hypertension: Secondary | ICD-10-CM | POA: Diagnosis not present

## 2019-10-17 DIAGNOSIS — R2689 Other abnormalities of gait and mobility: Secondary | ICD-10-CM

## 2019-10-17 DIAGNOSIS — I4892 Unspecified atrial flutter: Secondary | ICD-10-CM | POA: Diagnosis not present

## 2019-10-17 DIAGNOSIS — E785 Hyperlipidemia, unspecified: Secondary | ICD-10-CM

## 2019-10-17 DIAGNOSIS — I63131 Cerebral infarction due to embolism of right carotid artery: Secondary | ICD-10-CM

## 2019-10-17 DIAGNOSIS — I69398 Other sequelae of cerebral infarction: Secondary | ICD-10-CM

## 2019-10-17 NOTE — Progress Notes (Signed)
[facility] [address] [place]. [place] [address] [phone]       OFFICE FOLLOW UP NOTE  [name] Date of Birth:  [birth year] Medical Record Number:  [id number]   Referring MD: Stann Mainland, PA-C  Reason for Referral: Stroke  Chief Complaint  Patient presents with  . Follow-up    3 mon f/u. Daughter present. Rm 9. Patient's daughter stated that he'll say things as if someone is in the room but no one is.       HPI:   Initial consult visit 07/12/2019 Dr. Leonie Man: Jose Castro is a pleasant 85 year old Caucasian male who is seen today for initial office consultation visit for stroke.  He is accompanied today by his daughter.  History is obtained from them and review of electronic medical records.  I personally reviewed imaging films in PACS.  He was admitted to [facility] on 05/21/2019 when he was found on the floor of his home.  He was last seen well on 1 PM on 05/20/2019 but the daughter.  She spoke to him at 7 PM and he was apparently fine.  Patient's granddaughter checked on him noticed he had right-sided gaze deviation with decrease sensation and strength.  EMS was called patient was found to be in new onset atrial flutter.  Head CT scan obtained showed hypodensity in the right MCA consistent with stroke.  NIH stroke scale was 6 he was not a candidate for TPA or IR as he was felt to be outside time window.  Chest x-ray on admission showed vascular congestion possible aspiration pneumonia.  Neurological exam on admission showed dysarthria with right gaze deviation but able to cross midline.  There is 4/5 left hemiparesis.  Echocardiogram showed normal ejection fraction.  LDL cholesterol was 1 1 5  mg percent.  Hemoglobin A1c was 5.8.  Carotid ultrasound showed no significant extracranial stenosis.  Patient was seen by speech therapy and diet was advanced.  He was subsequently transferred for inpatient rehab to [facility] where he spent 10 days  and has subsequently been discharged home.  He is been getting home physical and occupational therapy.  He is able to walk with a walker.  His son feels left-sided sensory loss is improved but he still has some diminished fine motor skill and mild weakness.  Patient was seen by ophthalmologist recently who felt he had no visual field loss.  He also saw urologist 3 weeks ago for possibly from prostate difficulties and is currently being treated for UTI.  Is also had significant right foot swelling and redness which was diagnosed with cellulitis.  He was seen by orthopedics.  He is finished a course of doxycycline and is currently taking Bactrim.  Patient does have a remote history of splenic infarct for which she was on anticoagulation for some time but this was discontinued several years ago.  He has no other prior history of strokes TIAs dementia seizures or significant neurological problems.  Update 10/17/2019: Jose Castro is a 85 year old male who is being seen today for stroke follow-up.  Residual deficits of balance impairment and likely cognitive deficit and continues to participate in outpatient therapies.  Family continues to provide 24-hour supervision due to safety concerns of increased fall risk with impulsiveness and decreased safety awareness.  He continues to ambulate with his rolling walker.  Continues to participate in outpatient therapies with ongoing improvement.  Daughter questioning duration of continued assistance and around-the-clock supervision.  When asked about patient  performing ADLs and IADLs independently, she states that her siblings have him do more activities independently with supervision such as bill paying, cleaning and cooking but she does majority of IADLs for him despite recommendations from her siblings to allow him to be more independent.  Patient was also questioning potential return to driving.  Denies cognitive impairment and only age-related memory loss.  Patient is overly  frustrated due to continued lack of independence as he was previously living independently performing all ADLs and IADLs.  Continues on Eliquis and Crestor for secondary stroke prevention without side effects.  Blood pressure today 139/83.  Denies new or worsening stroke/TIA symptoms.    ROS:   14 system review of systems is positive for mild weakness, decreased hearing, all other systems negative  PMH:  Past Medical History:  Diagnosis Date  . Arthritis   . BPH (benign prostatic hyperplasia)   . Chronic kidney disease    had a kidney stone which per family was a cyst that was removed  . Dysrhythmia   . GERD (gastroesophageal reflux disease)   . History of kidney stones   . Hyperlipidemia   . Hypertension   . Splenic vein thrombosis   . Stroke ([place]) 05/22/2019  . Thrombosis 12/2015   mural  area and no notation of heart attack    Social History:  Social History   Socioeconomic History  . Marital status: Widowed    Spouse name: Not on file  . Number of children: 6  . Years of education: Not on file  . Highest education level: Some college, no degree  Occupational History  . Occupation: retired    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  Social Needs  . Financial resource strain: Not hard at all  . Food insecurity    Worry: Never true    Inability: Never true  . Transportation needs    Medical: No    Non-medical: No  Tobacco Use  . Smoking status: Former Smoker    Years: 16.00    Types: Cigarettes    Start date: 10/21/1944    Quit date: 10/21/1960    Years since quitting: 59.0  . Smokeless tobacco: Never Used  Substance and Sexual Activity  . Alcohol use: Yes    Alcohol/week: 3.0 standard drinks    Types: 3 Standard drinks or equivalent per week    Comment: once a week when he goes out to dinner-socially  . Drug use: No  . Sexual activity: Yes    Partners: Female  Lifestyle  . Physical activity    Days per week: 0 days    Minutes per session: 0 min  .  Stress: Not at all  Relationships  . Social connections    Talks on phone: More than three times a week    Gets together: Twice a week    Attends religious service: More than 4 times per year    Active member of club or organization: Yes    Attends meetings of clubs or organizations: More than 4 times per year    Relationship status: Widowed  . Intimate partner violence    Fear of current or ex partner: No    Emotionally abused: No    Physically abused: No    Forced sexual activity: No  Other Topics Concern  . Not on file  Social History Narrative   Widow, has a good networks of friends and family     Medications:   Current  Outpatient Medications on File Prior to Visit  Medication Sig Dispense Refill  . acetaminophen (TYLENOL) 325 MG tablet Take 2 tablets (650 mg total) by mouth every 4 (four) hours as needed for mild pain (or temp > 37.5 C (99.5 F)).    Marland Kitchen apixaban (ELIQUIS) 2.5 MG TABS tablet Take 1 tablet (2.5 mg total) by mouth 2 (two) times daily. 60 tablet 0  . Cholecalciferol (VITAMIN D3) 50 MCG (2000 UT) TABS Take 2,000 Units by mouth daily. 30 tablet 0  . finasteride (PROSCAR) 5 MG tablet Take 1 tablet (5 mg total) by mouth daily. 30 tablet 0  . metoprolol succinate (TOPROL XL) 25 MG 24 hr tablet Take 1 tablet (25 mg total) by mouth daily. 30 tablet 0  . Omega-3 Fatty Acids (FISH OIL) 1000 MG CAPS Take 1,000 mg by mouth daily.     . pantoprazole (PROTONIX) 40 MG tablet Take 1 tablet (40 mg total) by mouth daily. 90 tablet 1  . rosuvastatin (CRESTOR) 40 MG tablet Take 1 tablet (40 mg total) by mouth daily at 6 PM. 30 tablet 0  . tamsulosin (FLOMAX) 0.4 MG CAPS capsule      No current facility-administered medications on file prior to visit.     Allergies:   Allergies  Allergen Reactions  . Penicillins Itching and Swelling    Has patient had a PCN reaction causing immediate rash, facial/tongue/throat swelling, SOB or lightheadedness with hypotension: No Has patient had  a PCN reaction causing severe rash involving mucus membranes or skin necrosis: No Has patient had a PCN reaction that required hospitalization: No Has patient had a PCN reaction occurring within the last 10 years: No If all of the above answers are "NO", then may proceed with Cephalosporin use.     Physical Exam  Today's Vitals   10/17/19 1530  BP: 139/83  Pulse: 84  Temp: (!) 97.4 F (36.3 C)  TempSrc: Oral  Weight: 173 lb 12.8 oz (78.8 kg)  Height: 5' 10.5" (1.791 m)   Body mass index is 24.59 kg/m.    General: Pleasant elderly Caucasian male seated, in no evident distress Head: head normocephalic and atraumatic.   Neck: supple with no carotid or supraclavicular bruits Cardiovascular: regular rate and rhythm Musculoskeletal: no deformity Skin:  no rash/petichiae.  Vascular:  Normal pulses all extremities except in the right lower extremities where not very due to both  Neurologic Exam Mental Status: Awake and fully alert. Oriented to place and time. Recent and remote memory intact. Attention span, concentration and fund of knowledge appropriate. Mood and affect appropriate.  Cranial Nerves: Pupils unequal (chronic), briskly reactive to light. Extraocular movements full without nystagmus. Visual fields full to confrontation. Hearing diminished bilaterally.. Facial sensation intact. Face, tongue, palate moves normally and symmetrically.  Motor: Normal bulk and tone.   LUE: 4+/5 with decreased left hand dexterity LLE: 5/5 Full strength right upper and lower extremity Sensory.: intact to touch , pinprick , position and vibratory sensation.  Coordination: Rapid alternating movements normal in all extremities except mildly decreased left hand.  For his right arm over left arm.  Finger-to-nose and heel-to-shin performed mildly impaired on the left.. Gait and Station: Arises from chair without difficulty. Stance is normal. Gait demonstrates slow cautious steps with use of rolling  walker and decreased balance Reflexes: 1+ and symmetric. Toes downgoing.      ASSESSMENT: 85 year old Caucasian male with embolic right posterior temporal infarct in June 2020 secondary to new onset atrial flutter.  Remote  history of splenic infarct and vascular risk factors of hypertension hyperlipidemia and atrial flutter.  Residual deficits of mild LUE weakness and imbalance along with lack of insight     PLAN: -Long discussion with daughter and patient regarding residual deficits with ongoing 24/7 supervision due to safety concerns, lack of safety awareness and impulsivity.  Advised daughter that it is important for him to slowly increase independent activities with supervision and in regards to safety with future goal of patient potentially living independently in the future.  He will continue to use rolling walker for ambulation and for fall prevention.  Continue to work with PT/OT for ongoing improvement.  Would not recommend return to driving at this time due to ongoing deficits and lack of insight regarding his deficits and safety concerns. -Continue Eliquis and Crestor for secondary stroke prevention -Continue to follow with PCP for HTN and HLD management -Continue to follow with cardiology as scheduled  -maintain strict control of hypertension with blood pressure goal below 130/90, diabetes with hemoglobin A1c goal below 6.5% and lipids with LDL cholesterol goal below 70 mg/dL. I also advised the patient to eat a healthy diet with plenty of whole grains, cereals, fruits and vegetables, exercise regularly and maintain ideal body weight.   Follow-up in 3 months or call earlier if needed  Greater than 50% time during this 25-minute consultation visit was spent on counseling and coordination of care about embolic stroke and atrial flutter, discussion regarding residual deficits and safety concerns, discussion regarding importance of secondary stroke risk factor management and answering all  questions to patient and daughter satisfaction  Frann Rider, AGNP-BC  [facility] [address] [place], [place] [address]  Phone [phone] Fax [phone] Note: This document was prepared with digital dictation and possible smart phrase technology. Any transcriptional errors that result from this process are unintentional.

## 2019-10-17 NOTE — Patient Instructions (Signed)
Continue Eliquis (apixaban) daily  and Crestor  for secondary stroke prevention  Continue to follow up with PCP regarding cholesterol and blood pressure management   Continue to monitor blood pressure at home  Continue to work with outpatient therapies for ongoing improvement  Maintain strict control of hypertension with blood pressure goal below 130/90, diabetes with hemoglobin A1c goal below 6.5% and cholesterol with LDL cholesterol (bad cholesterol) goal below 70 mg/dL. I also advised the patient to eat a healthy diet with plenty of whole grains, cereals, fruits and vegetables, exercise regularly and maintain ideal body weight.  Followup in the future with me in 3 months or call earlier if needed       Thank you for coming to see [place] at [facility]. I hope we have been able to provide you high quality care today.  You may receive a patient satisfaction survey over the next few weeks. We would appreciate your feedback and comments so that we may continue to improve ourselves and the health of our patients.

## 2019-10-17 NOTE — Progress Notes (Signed)
I agree with the above plan 

## 2019-10-18 ENCOUNTER — Other Ambulatory Visit: Payer: Self-pay

## 2019-10-18 ENCOUNTER — Encounter: Payer: Self-pay | Admitting: Occupational Therapy

## 2019-10-18 ENCOUNTER — Ambulatory Visit: Payer: Medicare Other

## 2019-10-18 ENCOUNTER — Ambulatory Visit: Payer: Medicare Other | Admitting: Occupational Therapy

## 2019-10-18 DIAGNOSIS — R29898 Other symptoms and signs involving the musculoskeletal system: Secondary | ICD-10-CM

## 2019-10-18 DIAGNOSIS — R2681 Unsteadiness on feet: Secondary | ICD-10-CM

## 2019-10-18 DIAGNOSIS — I63511 Cerebral infarction due to unspecified occlusion or stenosis of right middle cerebral artery: Secondary | ICD-10-CM

## 2019-10-18 DIAGNOSIS — R278 Other lack of coordination: Secondary | ICD-10-CM

## 2019-10-18 DIAGNOSIS — M6281 Muscle weakness (generalized): Secondary | ICD-10-CM

## 2019-10-18 NOTE — Therapy (Signed)
[place] MAIN [facility] SERVICES [address] [place], [place], [address] Phone: [phone]   Fax:  [phone]  Physical Therapy Treatment  Patient Details  Name: Jose Castro MRN: [id number] Date of Birth: December 04, 1925 Referring Provider (PT): Dr. Letta Pate   Encounter Date: 10/18/2019  PT End of Session - 10/18/19 1427    Visit Number  6    Number of Visits  25    Date for PT Re-Evaluation  12/14/19    PT Start Time  1430    PT Stop Time  1515    PT Time Calculation (min)  45 min    Equipment Utilized During Treatment  Gait belt    Activity Tolerance  Patient tolerated treatment well    Behavior During Therapy  [facility] for tasks assessed/performed;Impulsive       Past Medical History:  Diagnosis Date  . Arthritis   . BPH (benign prostatic hyperplasia)   . Chronic kidney disease    had a kidney stone which per family was a cyst that was removed  . Dysrhythmia   . GERD (gastroesophageal reflux disease)   . History of kidney stones   . Hyperlipidemia   . Hypertension   . Splenic vein thrombosis   . Stroke ([place]) 05/22/2019  . Thrombosis 12/2015   mural  area and no notation of heart attack    Past Surgical History:  Procedure Laterality Date  . APPENDECTOMY    . CATARACT EXTRACTION, BILATERAL    . CYSTOSCOPY WITH INSERTION OF UROLIFT    . KIDNEY STONE SURGERY    . KNEE SURGERY    . PACEMAKER INSERTION N/A 07/13/2018   Procedure: INSERTION PACEMAKER-DUEL CHAMBER INITIAL IMPLANT;  Surgeon: Isaias Cowman, MD;  Location: [facility] ORS;  Service: Cardiovascular;  Laterality: N/A;  . SPINE SURGERY      There were no vitals filed for this visit.  Subjective Assessment - 10/18/19 1528    Subjective  Patient reports he is doing well today.  He reports no pain or soreness upon arrival.  No new questions or concerns at this time.    Patient is accompained by:  Family member    Pertinent History  Pt is a 85 year old male who presents  with imbalance and difficulty with gait following a right frontal CVA with left hemiparesis on 05/22/19.  He has completed inpatient and home health PT, and now presents for OP PT.  He was discharged from inpatient rehab at a supervision level of assistance, ambulating with a RW.  His family notes that he is impulsive and will try to ambulate without his RW at home.  Family is currently providing 24/7 supervision.    Limitations  House hold activities;Walking    How long can you sit comfortably?  no limitations    How long can you walk comfortably?  he requires a RW at home    Patient Stated Goals  get back to normal; want to be able to play golf and go out to play trivia again.  His daughters want him to regain independence with going to the bathroom at night    Currently in Pain?  No/denies    Pain Onset  --         TREATMENT   Therapeutic Exercise:   STSs from standard chair without UE support 2x10; CGA throughout  Side steps with red tband x3 lengths of // bars in each direction  Standing hip extension with red band 2x10 bilaterally  Neuromuscular Reeducation:   Marching on airex 2x10 with BUE support on // bars  Rocker board forward and backward with SUE support x10 in each direction  Rocker board side to side with SUE support x10 in each direction   Tandem forward and retro gait on airex balance beam x4 in each direction with SUE forward and BUE support backwards.  Staggered stance balance with back foot on foam and front foot elevated on step 2x30s with each leg in front     Pt educated throughout session about proper posture and technique with exercises. Improved exercise technique, movement at target joints, use of target muscles after min to mod verbal, visual, tactile cues.      Pt demonstrated good motivation throughout today's session. He demonstrates improved control throughout STSs today, displaying improved anterior weight shift today.  He  displays good ankle strategies during staggered stance balance, able to complete without UE support, however requires CGA throughout.  He demonstrates the ability to perform tandem gait on an airex pad today with SUE forward and BUE backwards. Pt will benefit from skilled PT services to address deficits in balance and decrease risk for future falls.              PT Short Term Goals - 09/23/19 1052      PT SHORT TERM GOAL #1   Title  Pt will be independent with HEP in order to improve strength and balance in order to decrease fall risk and improve function at home and work.    Time  6    Period  Weeks    Status  New    Target Date  11/02/19        PT Long Term Goals - 09/23/19 1053      PT LONG TERM GOAL #1   Title  Pt will improve BERG to >45 in order to decrease his fall risk.    Baseline  09/21/19: 42/56    Time  12    Period  Weeks    Status  New    Target Date  12/14/19      PT LONG TERM GOAL #2   Title  Pt will decrease TUG to below 14 seconds/decrease in order to demonstrate decreased fall risk.    Baseline  09/21/19: 22.19s    Time  12    Period  Weeks    Status  New    Target Date  12/14/19      PT LONG TERM GOAL #3   Title  Pt will decrease 5TSTS by at least 3 seconds in order to demonstrate clinically significant improvement in LE strength.    Baseline  09/21/19: 32.28s    Time  12    Period  Weeks    Status  New    Target Date  12/14/19      PT LONG TERM GOAL #4   Title  Pt will increase his 10MWT by at least 0.13 m/s in order to demonstrate a clinically significant improvement in gait speed.    Baseline  09/21/19: Self-selected: 20.9 s = 0.48 m/s; Fastest: 17.1s =0.58 m/s    Time  12    Period  Weeks    Status  New    Target Date  12/14/19            Plan - 10/18/19 1526    Clinical Impression Statement  Pt demonstrated good motivation throughout today's session. He demonstrates improved control throughout STSs today, displaying improved  anterior weight shift today.  He displays good ankle strategies during staggered stance balance, able to complete without UE support, however requires CGA throughout.  He demonstrates the ability to perform tandem gait on an airex pad today with SUE forward and BUE backwards. Pt will benefit from skilled PT services to address deficits in balance and decrease risk for future falls.    Personal Factors and Comorbidities  Age;Comorbidity 2    Comorbidities  HTN, CVA    Examination-Activity Limitations  Bend;Lift;Squat;Locomotion Level;Stairs;Stand    Examination-Participation Restrictions  Community Activity;Driving    Stability/Clinical Decision Making  Evolving/Moderate complexity    Rehab Potential  Good    PT Frequency  2x / week    PT Duration  12 weeks    PT Treatment/Interventions  ADLs/Self Care Home Management;Canalith Repostioning;Cryotherapy;Electrical Stimulation;Iontophoresis 4mg /ml Dexamethasone;Moist Heat;Traction;Ultrasound;DME Instruction;Gait training;Stair training;Therapeutic activities;Therapeutic exercise;Functional mobility training;Balance training;Neuromuscular re-education;Patient/family education;Manual techniques;Dry needling;Vestibular;Joint Manipulations;Spinal Manipulations    PT Next Visit Plan  progress strength and balance    PT Home Exercise Plan  introduce next session    Consulted and Agree with Plan of Care  Patient;Family member/caregiver    Family Member Consulted  pt's son       Patient will benefit from skilled therapeutic intervention in order to improve the following deficits and impairments:  Abnormal gait, Decreased balance, Decreased endurance, Decreased mobility, Difficulty walking, Decreased knowledge of precautions, Decreased safety awareness, Decreased activity tolerance, Decreased strength, Hypermobility  Visit Diagnosis: Unsteadiness on feet  Decreased strength of lower extremity     Problem List Patient Active Problem List   Diagnosis  Date Noted  . 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 Great toe pain, right   . Lethargy   . CKD (chronic kidney disease), stage II   . Leukocytosis   . Benign essential HTN   . New onset atrial fibrillation ([place])   . Dysphagia, post-stroke   . 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 Acute ischemic right MCA stroke ([place]) 05/25/2019  . Goals of care, counseling/discussion   . Palliative care by specialist   . DNR (do not resuscitate) discussion   . Stroke, acute, embolic ([place]) 25/85/2778  . Persistent proteinuria 07/30/2018  . 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 Chronic kidney disease, stage III (moderate) 06/15/2018  . Leg pain 05/06/2018  . Lymphedema 05/06/2018  . Coagulopathy ([place]) 07/07/2017  . Lumbar spondylosis 03/10/2017  . Elevated uric acid in blood 11/20/2016  . BPH (benign prostatic hyperplasia) 05/29/2016  . Peripheral vascular disease of lower extremity ([place]) 03/03/2016  . Gallstone 12/31/2015  . 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 Atherosclerosis of aorta ([place]) 11/22/2015  . Carotid artery narrowing 11/22/2015  . Diverticulosis of colon 11/22/2015  . Decreased creatinine clearance 11/22/2015  . Arthritis, degenerative 11/22/2015  . Lactose intolerance 11/22/2015  . Basal cell carcinoma 11/22/2015  . Essential hypertension 06/21/2015  . Hyperlipemia 06/21/2015  . GERD (gastroesophageal reflux disease) 06/21/2015  . Calculus of kidney 11/18/2013    This entire session was performed under direct supervision and direction of a licensed therapist/therapist assistant . I have personally read, edited and approve of the note as written.   Lutricia Horsfall, SPT [facility] PT, DPT, GCS  Huprich,Jason 10/19/2019, 10:28 AM  [place] MAIN [facility] SERVICES [address] [place], [place], [address] Phone: [phone]   Fax:  [phone]  Name: Jose Castro  MRN: [id number] Date of Birth: 02-14-1926

## 2019-10-19 ENCOUNTER — Ambulatory Visit: Payer: Medicare Other

## 2019-10-20 ENCOUNTER — Ambulatory Visit: Payer: Medicare Other | Admitting: Occupational Therapy

## 2019-10-20 ENCOUNTER — Other Ambulatory Visit: Payer: Self-pay

## 2019-10-20 ENCOUNTER — Ambulatory Visit: Payer: Medicare Other

## 2019-10-20 DIAGNOSIS — M6281 Muscle weakness (generalized): Secondary | ICD-10-CM | POA: Diagnosis not present

## 2019-10-20 DIAGNOSIS — R2681 Unsteadiness on feet: Secondary | ICD-10-CM

## 2019-10-20 DIAGNOSIS — R278 Other lack of coordination: Secondary | ICD-10-CM

## 2019-10-20 DIAGNOSIS — R29898 Other symptoms and signs involving the musculoskeletal system: Secondary | ICD-10-CM

## 2019-10-20 NOTE — Therapy (Signed)
[place] MAIN [facility] SERVICES [address] [place], [place], [address] Phone: [phone]   Fax:  [phone]  Physical Therapy Treatment  Patient Details  Name: Jose Castro MRN: [id number] Date of Birth: [birth year] Referring Provider (PT): Dr. Letta Pate   Encounter Date: 10/20/2019  PT End of Session - 10/20/19 1438    Visit Number  7    Number of Visits  25    Date for PT Re-Evaluation  12/14/19    PT Start Time  1430    PT Stop Time  1515    PT Time Calculation (min)  45 min    Equipment Utilized During Treatment  Gait belt    Activity Tolerance  Patient tolerated treatment well    Behavior During Therapy  [facility] for tasks assessed/performed;Impulsive       Past Medical History:  Diagnosis Date  . Arthritis   . BPH (benign prostatic hyperplasia)   . Chronic kidney disease    had a kidney stone which per family was a cyst that was removed  . Dysrhythmia   . GERD (gastroesophageal reflux disease)   . History of kidney stones   . Hyperlipidemia   . Hypertension   . Splenic vein thrombosis   . Stroke ([place]) 05/22/2019  . Thrombosis 12/2015   mural  area and no notation of heart attack    Past Surgical History:  Procedure Laterality Date  . APPENDECTOMY    . CATARACT EXTRACTION, BILATERAL    . CYSTOSCOPY WITH INSERTION OF UROLIFT    . KIDNEY STONE SURGERY    . KNEE SURGERY    . PACEMAKER INSERTION N/A 07/13/2018   Procedure: INSERTION PACEMAKER-DUEL CHAMBER INITIAL IMPLANT;  Surgeon: Isaias Cowman, MD;  Location: [facility] ORS;  Service: Cardiovascular;  Laterality: N/A;  . SPINE SURGERY      There were no vitals filed for this visit.  Subjective Assessment - 10/20/19 1437    Subjective  Patient reports he is doing well today.  He reports no pain or soreness upon arrival.  He reports no falls since last visit.  No new questions or concerns at this time.    Patient is accompained by:  Family member    Pertinent History  Pt  is a 85 year old male who presents with imbalance and difficulty with gait following a right frontal CVA with left hemiparesis on 05/22/19.  He has completed inpatient and home health PT, and now presents for OP PT.  He was discharged from inpatient rehab at a supervision level of assistance, ambulating with a RW.  His family notes that he is impulsive and will try to ambulate without his RW at home.  Family is currently providing 24/7 supervision.    Limitations  House hold activities;Walking    How long can you sit comfortably?  no limitations    How long can you walk comfortably?  he requires a RW at home    Patient Stated Goals  get back to normal; want to be able to play golf and go out to play trivia again.  His daughters want him to regain independence with going to the bathroom at night    Currently in Pain?  No/denies         TREATMENT   Therapeutic Exercise:  NuSTEP 5 min at L2 during history   STSs from standard chair without UE support2x12; CGA throughout  Golf chip shots to encourage balance and weight shift; CGA provided throughout    Neuromuscular  Reeducation:  Side stepping on airex balance beam x3 in each direction with SUE support throughout  Golf trialed on airex balance beam and 1/2 foam roll, but too difficult, leading to a posterior weight shift     Pt educated throughout session about proper posture and technique with exercises. Improved exercise technique, movement at target joints, use of target muscles after min to mod verbal, visual, tactile cues.    Pt demonstrated good motivation throughout today's session.He continues to demonstrate good control and weight shift with STSs today.  He demonstrated improved control with side stepping today on an uneven surface, but requires SUE support throughout for steadying.  Golf chip shots were trialed on an airex balance beam and on the 1/2 foam roll, however pt had a tendency to lean posteriorly.   He was able to complete the activity on a firm surface with a focus on weight shifting and balance. He demonstrates the ability to perform tandem gait on an airex pad today with SUE forward and BUE backwards. Pt will benefit from skilled PT services to address deficits in balance and decrease risk for future falls.                         PT Short Term Goals - 09/23/19 1052      PT SHORT TERM GOAL #1   Title  Pt will be independent with HEP in order to improve strength and balance in order to decrease fall risk and improve function at home and work.    Time  6    Period  Weeks    Status  New    Target Date  11/02/19        PT Long Term Goals - 09/23/19 1053      PT LONG TERM GOAL #1   Title  Pt will improve BERG to >45 in order to decrease his fall risk.    Baseline  09/21/19: 42/56    Time  12    Period  Weeks    Status  New    Target Date  12/14/19      PT LONG TERM GOAL #2   Title  Pt will decrease TUG to below 14 seconds/decrease in order to demonstrate decreased fall risk.    Baseline  09/21/19: 22.19s    Time  12    Period  Weeks    Status  New    Target Date  12/14/19      PT LONG TERM GOAL #3   Title  Pt will decrease 5TSTS by at least 3 seconds in order to demonstrate clinically significant improvement in LE strength.    Baseline  09/21/19: 32.28s    Time  12    Period  Weeks    Status  New    Target Date  12/14/19      PT LONG TERM GOAL #4   Title  Pt will increase his 10MWT by at least 0.13 m/s in order to demonstrate a clinically significant improvement in gait speed.    Baseline  09/21/19: Self-selected: 20.9 s = 0.48 m/s; Fastest: 17.1s =0.58 m/s    Time  12    Period  Weeks    Status  New    Target Date  12/14/19            Plan - 10/20/19 1703    Clinical Impression Statement  Pt demonstrated good motivation throughout today's session. He continues to demonstrate good control  and weight shift with STSs today.  He  demonstrated improved control with side stepping today on an uneven surface, but requires SUE support throughout for steadying.  Golf chip shots were trialed on an airex balance beam and on the 1/2 foam roll, however pt had a tendency to lean posteriorly.  He was able to complete the activity on a firm surface with a focus on weight shifting and balance.  He demonstrates the ability to perform tandem gait on an airex pad today with SUE forward and BUE backwards. Pt will benefit from skilled PT services to address deficits in balance and decrease risk for future falls.    Personal Factors and Comorbidities  Age;Comorbidity 2    Comorbidities  HTN, CVA    Examination-Activity Limitations  Bend;Lift;Squat;Locomotion Level;Stairs;Stand    Examination-Participation Restrictions  Community Activity;Driving    Stability/Clinical Decision Making  Evolving/Moderate complexity    Rehab Potential  Good    PT Frequency  2x / week    PT Duration  12 weeks    PT Treatment/Interventions  ADLs/Self Care Home Management;Canalith Repostioning;Cryotherapy;Electrical Stimulation;Iontophoresis 4mg /ml Dexamethasone;Moist Heat;Traction;Ultrasound;DME Instruction;Gait training;Stair training;Therapeutic activities;Therapeutic exercise;Functional mobility training;Balance training;Neuromuscular re-education;Patient/family education;Manual techniques;Dry needling;Vestibular;Joint Manipulations;Spinal Manipulations    PT Next Visit Plan  progress strength and balance    PT Home Exercise Plan  introduce next session    Consulted and Agree with Plan of Care  Patient;Family member/caregiver    Family Member Consulted  pt's son       Patient will benefit from skilled therapeutic intervention in order to improve the following deficits and impairments:  Abnormal gait, Decreased balance, Decreased endurance, Decreased mobility, Difficulty walking, Decreased knowledge of precautions, Decreased safety awareness, Decreased activity  tolerance, Decreased strength, Hypermobility  Visit Diagnosis: Unsteadiness on feet  Decreased strength of lower extremity     Problem List Patient Active Problem List   Diagnosis Date Noted  . 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 Great toe pain, right   . Lethargy   . CKD (chronic kidney disease), stage II   . Leukocytosis   . Benign essential HTN   . New onset atrial fibrillation ([place])   . Dysphagia, post-stroke   . 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 Acute ischemic right MCA stroke ([place]) 05/25/2019  . Goals of care, counseling/discussion   . Palliative care by specialist   . DNR (do not resuscitate) discussion   . Stroke, acute, embolic ([place]) 16/05/3709  . Persistent proteinuria 07/30/2018  . 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 Chronic kidney disease, stage III (moderate) 06/15/2018  . Leg pain 05/06/2018  . Lymphedema 05/06/2018  . Coagulopathy ([place]) 07/07/2017  . Lumbar spondylosis 03/10/2017  . Elevated uric acid in blood 11/20/2016  . BPH (benign prostatic hyperplasia) 05/29/2016  . Peripheral vascular disease of lower extremity ([place]) 03/03/2016  . Gallstone 12/31/2015  . 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 Atherosclerosis of aorta ([place]) 11/22/2015  . Carotid artery narrowing 11/22/2015  . Diverticulosis of colon 11/22/2015  . Decreased creatinine clearance 11/22/2015  . Arthritis, degenerative 11/22/2015  . Lactose intolerance 11/22/2015  . Basal cell carcinoma 11/22/2015  . Essential hypertension 06/21/2015  . Hyperlipemia 06/21/2015  . GERD (gastroesophageal reflux disease) 06/21/2015  . Calculus of kidney 11/18/2013   This entire session was performed under direct supervision and direction of a licensed therapist/therapist assistant . I have personally read, edited and approve of the note as  written.   Lutricia Horsfall, SPT [facility] PT, DPT, GCS   Huprich,Jason 10/21/2019, 2:25 PM  [place] MAIN [facility] SERVICES [address] [place], [place], [address] Phone: [phone]   Fax:  [phone]  Name: Jose Castro MRN: [id number] Date of Birth: [birth year]

## 2019-10-20 NOTE — Therapy (Signed)
[place] MAIN [facility] SERVICES [address] [place], [place], [address] Phone: [phone]   Fax:  [phone]  Occupational Therapy Treatment  Patient Details  Name: Jose Castro MRN: [id number] Date of Birth: Nov 21, 1926 Referring Provider (OT): Jose Castro   Encounter Date: 10/18/2019  OT End of Session - 10/20/19 1206    Visit Number  6    Number of Visits  24    Date for OT Re-Evaluation  12/14/19    OT Start Time  1345    OT Stop Time  1428    OT Time Calculation (min)  43 min    Activity Tolerance  Patient tolerated treatment well    Behavior During Therapy  [facility] for tasks assessed/performed       Past Medical History:  Diagnosis Date  . Arthritis   . BPH (benign prostatic hyperplasia)   . Chronic kidney disease    had a kidney stone which per family was a cyst that was removed  . Dysrhythmia   . GERD (gastroesophageal reflux disease)   . History of kidney stones   . Hyperlipidemia   . Hypertension   . Splenic vein thrombosis   . Stroke ([place]) 05/22/2019  . Thrombosis 12/2015   mural  area and no notation of heart attack    Past Surgical History:  Procedure Laterality Date  . APPENDECTOMY    . CATARACT EXTRACTION, BILATERAL    . CYSTOSCOPY WITH INSERTION OF UROLIFT    . KIDNEY STONE SURGERY    . KNEE SURGERY    . PACEMAKER INSERTION N/A 07/13/2018   Procedure: INSERTION PACEMAKER-DUEL CHAMBER INITIAL IMPLANT;  Surgeon: Isaias Cowman, MD;  Location: [facility] ORS;  Service: Cardiovascular;  Laterality: N/A;  . SPINE SURGERY      There were no vitals filed for this visit.  Subjective Assessment - 10/20/19 1204    Subjective   Patient's son present today during session.  Reports they are trying to transition patient back to being more independent and wants to make sure he is safe.    Pertinent History  Pt. is a 85 y.o. male who was admitted to [facility] with a [facility] Infarct on 05/21/2019. Pt.  went through Inpatient Rehab, and had home health OT, PT, and ST services. Pt. was previously residing home alone, and was independent with ADLs, and IADLs. Pt. now has family staying with the pt. around the clock.    Patient Stated Goals  to regain functional indpendence safely    Currently in Pain?  No/denies    Pain Score  0-No pain         Patient seen this date for safety with home management tasks.  Patient given a variety of scenarios to problem solve regarding issues which may occur at home.  Patient able to respond with several options with most questions and would answer 911 if situation was dangerous and required immediate assistance.  Patient had difficulty at times coming up with an answer of calling a professional to help with the situation such as a Producer, television/film/video and reports most of his life he would work on things himself and try to fix items prior to calling someone in.  Once he realized this he was more consistent with his answers regarding the use of a professional for completing necessary home repairs.  Patient did mention something about going on the roof but then retracted his answer to say he would not attempt going on  the roof but would reach out to his son if there was an issue similar to the scenario offered.   Response to tx:  Patient able to answer safety questions appropriately but does appear hesitant at times to call a professional for help with home maintenance tasks.  Patient is able to determine appropriate times to call 911 for an emergency situation.  Patient appears very literal at times with his approach to tasks and has some difficulty when judgment is required during a task if the situation is grey in nature.  Son gave a situation of the doctor telling the patient to use his walker at home so patient will not go without walker even though he has practiced this skill in therapy for short distances with PT.  Patient has continued to make good progress and  would like to work on meal prep in the kitchen in the next 1-2 sessions.                    OT Education - 10/20/19 1205    Education Details  safety at home with specific tasks    Person(s) Educated  Patient    Methods  Explanation;Demonstration    Comprehension  Verbalized understanding;Returned demonstration;Verbal cues required          OT Long Term Goals - 09/21/19 1726      OT LONG TERM GOAL #1   Title  Pt. will demonstrate home management tasks with supervision initating safe strategies    Baseline  Eval: Decreased safety awareness    Time  12    Period  Weeks    Status  New    Target Date  12/14/19      OT LONG TERM GOAL #2   Title  Pt. will be able to identify 100% of potential safety hazards in a simulated home environment during ADLs, and IADLs.    Baseline  Eval: Limited safety awareness    Time  12    Period  Weeks    Status  New    Target Date  12/14/19      OT LONG TERM GOAL #3   Title  Pt. will increase left grip strength by 5# of force to assist with holding items duirng ADLs, and IADLs.    Baseline  Eval: decreased left grip strength    Time  12    Period  Weeks    Status  New    Target Date  12/14/19      OT LONG TERM GOAL #4   Title  Pt. will improve left hand [facility] skills by 3 sec of speed to be able to manipulate objects during ADLs, and IADL tasks.    Baseline  Eval: Pt. has difficulty    Time  12    Period  Weeks    Target Date  12/14/19      OT LONG TERM GOAL #5   Title  Pt. will demonstrate visual compensatory strategies as needed during ADLs, and IADL tasks.    Baseline  Eval: Limited left sided awareness    Time  12    Period  Weeks    Status  New    Target Date  12/14/19      Long Term Additional Goals   Additional Long Term Goals  Yes      OT LONG TERM GOAL #6   Title  Pt. will demonstrate cognitive compensatory strategies during ADLs, and IADLs    Baseline  Eval:  Limited cognition/problem solving, impulsive     Time  12    Period  Weeks    Status  New    Target Date  12/14/19            Plan - 10/20/19 1206    Clinical Impression Statement  Patient able to answer safety questions appropriately but does appear hesitant at times to call a professional for help with home maintenance tasks.  Patient is able to determine appropriate times to call 911 for an emergency situation.  Patient appears very literal at times with his approach to tasks and has some difficulty when judgment is required during a task if the situation is grey in nature.  Son gave a situation of the doctor telling the patient to use his walker at home so patient will not go without walker even though he has practiced this skill in therapy for short distances with PT.  Patient has continued to make good progress and would like to work on meal prep in the kitchen in the next 1-2 sessions.    OT Occupational Profile and History  Detailed Assessment- Review of Records and additional review of physical, cognitive, psychosocial history related to current functional performance    Occupational performance deficits (Please refer to evaluation for details):  ADL's;IADL's    Body Structure / Function / Physical Skills  ADL;FMC;ROM;IADL;Strength;Decreased knowledge of use of DME;UE functional use    Rehab Potential  Excellent    Clinical Decision Making  Several treatment options, min-mod task modification necessary    Comorbidities Affecting Occupational Performance:  May have comorbidities impacting occupational performance    Modification or Assistance to Complete Evaluation   Min-Moderate modification of tasks or assist with assess necessary to complete eval    OT Frequency  2x / week    OT Duration  12 weeks    OT Treatment/Interventions  Self-care/ADL training;DME and/or AE instruction;Therapeutic exercise;Neuromuscular education;Patient/family education;Therapeutic activities    Consulted and Agree with Plan of Care  Patient        Patient will benefit from skilled therapeutic intervention in order to improve the following deficits and impairments:   Body Structure / Function / Physical Skills: ADL, FMC, ROM, IADL, Strength, Decreased knowledge of use of DME, UE functional use       Visit Diagnosis: Right middle cerebral artery stroke (HCC)  Unsteadiness on feet  Muscle weakness (generalized)  Other lack of coordination    Problem List Patient Active Problem List   Diagnosis Date Noted  . 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 Great toe pain, right   . Lethargy   . CKD (chronic kidney disease), stage II   . Leukocytosis   . Benign essential HTN   . New onset atrial fibrillation ([place])   . Dysphagia, post-stroke   . Right middle cerebral artery stroke ([place]) 05/27/2019  . Acute ischemic right MCA stroke ([place]) 05/25/2019  . Goals of care, counseling/discussion   . Palliative care by specialist   . DNR (do not resuscitate) discussion   . Stroke, acute, embolic ([place]) 81/19/1478  . Persistent proteinuria 07/30/2018  . 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 Chronic kidney disease, stage III (moderate) 06/15/2018  . Leg pain 05/06/2018  . Lymphedema 05/06/2018  . Coagulopathy ([place]) 07/07/2017  . Lumbar spondylosis 03/10/2017  . Elevated uric acid in blood 11/20/2016  . BPH (benign prostatic hyperplasia) 05/29/2016  . Peripheral vascular disease of lower extremity ([place]) 03/03/2016  .  Gallstone 12/31/2015  . 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 Atherosclerosis of aorta ([place]) 11/22/2015  . Carotid artery narrowing 11/22/2015  . Diverticulosis of colon 11/22/2015  . Decreased creatinine clearance 11/22/2015  . Arthritis, degenerative 11/22/2015  . Lactose intolerance 11/22/2015  . Basal cell carcinoma 11/22/2015  . Essential hypertension 06/21/2015  . Hyperlipemia 06/21/2015  . GERD (gastroesophageal reflux disease) 06/21/2015  . Calculus  of kidney 11/18/2013    Abel Hageman 10/21/2019, 4:21 PM  [place] MAIN [facility] SERVICES [address] [place], [place], [address] Phone: [phone]   Fax:  [phone]  Name: KERNEY HOPFENSPERGER MRN: [id number] Date of Birth: [birth year]

## 2019-10-21 ENCOUNTER — Encounter: Payer: Self-pay | Admitting: Occupational Therapy

## 2019-10-21 NOTE — Therapy (Signed)
[place] MAIN [facility] SERVICES [address] [place], [place], [address] Phone: [phone]   Fax:  [phone]  Occupational Therapy Treatment  Patient Details  Name: Jose Castro MRN: [id number] Date of Birth: 03-Apr-1926 Referring Provider (OT): Steele Sizer   Encounter Date: 10/20/2019  OT End of Session - 10/21/19 1732    Visit Number  7    Number of Visits  24    Date for OT Re-Evaluation  12/14/19    OT Start Time  8101    OT Stop Time  1430    OT Time Calculation (min)  41 min    Activity Tolerance  Patient tolerated treatment well    Behavior During Therapy  [facility] for tasks assessed/performed;Impulsive       Past Medical History:  Diagnosis Date  . Arthritis   . BPH (benign prostatic hyperplasia)   . Chronic kidney disease    had a kidney stone which per family was a cyst that was removed  . Dysrhythmia   . GERD (gastroesophageal reflux disease)   . History of kidney stones   . Hyperlipidemia   . Hypertension   . Splenic vein thrombosis   . Stroke ([place]) 05/22/2019  . Thrombosis 12/2015   mural  area and no notation of heart attack    Past Surgical History:  Procedure Laterality Date  . APPENDECTOMY    . CATARACT EXTRACTION, BILATERAL    . CYSTOSCOPY WITH INSERTION OF UROLIFT    . KIDNEY STONE SURGERY    . KNEE SURGERY    . PACEMAKER INSERTION N/A 07/13/2018   Procedure: INSERTION PACEMAKER-DUEL CHAMBER INITIAL IMPLANT;  Surgeon: Isaias Cowman, MD;  Location: [facility] ORS;  Service: Cardiovascular;  Laterality: N/A;  . SPINE SURGERY      There were no vitals filed for this visit.  Subjective Assessment - 10/21/19 1731    Pertinent History  Pt. is a 85 y.o. male who was admitted to [facility] with a [facility] Infarct on 05/21/2019. Pt. went through Inpatient Rehab, and had home health OT, PT, and ST services. Pt. was previously residing home alone, and was independent with ADLs, and IADLs. Pt. now  has family staying with the pt. around the clock.    Patient Stated Goals  to regain functional indpendence safely    Currently in Pain?  No/denies    Pain Score  0-No pain        ADLs: Patient seen for visual scanning tasks while performing functional mobility in hallway to detect cards on the wall placed in various planes of visual field.  On the first pass down the hallway, patient missed 7 items but felt he got them all, on the second trial, he got all items with cues for attention to one.   Patient seen in the kitchen for loading and loading dishwasher, placing items in the cabinets and removing items.  Occasional cues for positioning for safety.  Patient tends to use an extended reach at times and required instruction for alternative ways to position self for activity.   Therapeutic Exercise:  Patient seen for UB strengthening tasks with reaching tasks in multi directions.  Patient engaging in upper body reciprocal arm exercise with resistance for 7 mins from seated position, therapist in constant attendance to ensure grip and adjust levels of resistance.    Spoke with daughter about cooking session next week and she will bring in the ingredients for the session.   Response to tx:  Pt is easily distracted during session and often required redirection to task.  He did require cues for safety in the kitchen with positioning of self and his walker to be able to complete loading and unloading dishwasher.  Will continue to work towards IADL tasks to help transition patient back to independent living and decreased reliance on caregiver assistance from family.  Plan to focus on cooking next session and family to bring in items for session.                   OT Education - 10/21/19 1731    Education Details  vision and scanning, kitchen tasks    Person(s) Educated  Patient    Methods  Explanation;Demonstration    Comprehension  Verbalized understanding;Returned demonstration;Verbal  cues required          OT Long Term Goals - 09/21/19 1726      OT LONG TERM GOAL #1   Title  Pt. will demonstrate home management tasks with supervision initating safe strategies    Baseline  Eval: Decreased safety awareness    Time  12    Period  Weeks    Status  New    Target Date  12/14/19      OT LONG TERM GOAL #2   Title  Pt. will be able to identify 100% of potential safety hazards in a simulated home environment during ADLs, and IADLs.    Baseline  Eval: Limited safety awareness    Time  12    Period  Weeks    Status  New    Target Date  12/14/19      OT LONG TERM GOAL #3   Title  Pt. will increase left grip strength by 5# of force to assist with holding items duirng ADLs, and IADLs.    Baseline  Eval: decreased left grip strength    Time  12    Period  Weeks    Status  New    Target Date  12/14/19      OT LONG TERM GOAL #4   Title  Pt. will improve left hand [facility] skills by 3 sec of speed to be able to manipulate objects during ADLs, and IADL tasks.    Baseline  Eval: Pt. has difficulty    Time  12    Period  Weeks    Target Date  12/14/19      OT LONG TERM GOAL #5   Title  Pt. will demonstrate visual compensatory strategies as needed during ADLs, and IADL tasks.    Baseline  Eval: Limited left sided awareness    Time  12    Period  Weeks    Status  New    Target Date  12/14/19      Long Term Additional Goals   Additional Long Term Goals  Yes      OT LONG TERM GOAL #6   Title  Pt. will demonstrate cognitive compensatory strategies during ADLs, and IADLs    Baseline  Eval: Limited cognition/problem solving, impulsive    Time  12    Period  Weeks    Status  New    Target Date  12/14/19            Plan - 10/21/19 1733    Clinical Impression Statement  Pt is easily distracted during session and often required redirection to task.  He did require cues for safety in the kitchen with positioning of self and his  walker to be able to complete loading  and unloading dishwasher.  Will continue to work towards IADL tasks to help transition patient back to independent living and decreased reliance on caregiver assistance from family.  Plan to focus on cooking next session and family to bring in items for session.    OT Occupational Profile and History  Detailed Assessment- Review of Records and additional review of physical, cognitive, psychosocial history related to current functional performance    Occupational performance deficits (Please refer to evaluation for details):  ADL's;IADL's    Body Structure / Function / Physical Skills  ADL;FMC;ROM;IADL;Strength;Decreased knowledge of use of DME;UE functional use    Rehab Potential  Excellent    Comorbidities Affecting Occupational Performance:  May have comorbidities impacting occupational performance    Modification or Assistance to Complete Evaluation   Min-Moderate modification of tasks or assist with assess necessary to complete eval    OT Frequency  2x / week    OT Duration  12 weeks    OT Treatment/Interventions  Self-care/ADL training;DME and/or AE instruction;Therapeutic exercise;Neuromuscular education;Patient/family education;Therapeutic activities    Consulted and Agree with Plan of Care  Patient       Patient will benefit from skilled therapeutic intervention in order to improve the following deficits and impairments:   Body Structure / Function / Physical Skills: ADL, FMC, ROM, IADL, Strength, Decreased knowledge of use of DME, UE functional use       Visit Diagnosis: Muscle weakness (generalized)  Other lack of coordination  Unsteadiness on feet    Problem List Patient Active Problem List   Diagnosis Date Noted  . 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 Great toe pain, right   . Lethargy   . CKD (chronic kidney disease), stage II   . Leukocytosis   . Benign essential HTN   . New onset atrial fibrillation ([place])   . Dysphagia, post-stroke   . Right middle  cerebral artery stroke ([place]) 05/27/2019  . Acute ischemic right MCA stroke ([place]) 05/25/2019  . Goals of care, counseling/discussion   . Palliative care by specialist   . DNR (do not resuscitate) discussion   . Stroke, acute, embolic ([place]) 62/22/9798  . Persistent proteinuria 07/30/2018  . 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 Chronic kidney disease, stage III (moderate) 06/15/2018  . Leg pain 05/06/2018  . Lymphedema 05/06/2018  . Coagulopathy ([place]) 07/07/2017  . Lumbar spondylosis 03/10/2017  . Elevated uric acid in blood 11/20/2016  . BPH (benign prostatic hyperplasia) 05/29/2016  . Peripheral vascular disease of lower extremity ([place]) 03/03/2016  . Gallstone 12/31/2015  . 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 Atherosclerosis of aorta ([place]) 11/22/2015  . Carotid artery narrowing 11/22/2015  . Diverticulosis of colon 11/22/2015  . Decreased creatinine clearance 11/22/2015  . Arthritis, degenerative 11/22/2015  . Lactose intolerance 11/22/2015  . Basal cell carcinoma 11/22/2015  . Essential hypertension 06/21/2015  . Hyperlipemia 06/21/2015  . GERD (gastroesophageal reflux disease) 06/21/2015  . Calculus of kidney 11/18/2013   Achilles Dunk, OTR/L, CLT  Lei Dower 10/21/2019, 5:50 PM  [place] MAIN [facility] SERVICES [address] [place], [place], [address] Phone: [phone]   Fax:  [phone]  Name: Jose Castro MRN: [id number] Date of Birth: 03-09-1926

## 2019-10-24 ENCOUNTER — Ambulatory Visit: Payer: Medicare Other

## 2019-10-25 ENCOUNTER — Ambulatory Visit: Payer: Medicare Other

## 2019-10-25 ENCOUNTER — Encounter: Payer: Self-pay | Admitting: Occupational Therapy

## 2019-10-25 ENCOUNTER — Ambulatory Visit: Payer: Medicare Other | Admitting: Occupational Therapy

## 2019-10-25 ENCOUNTER — Other Ambulatory Visit: Payer: Self-pay

## 2019-10-25 DIAGNOSIS — M6281 Muscle weakness (generalized): Secondary | ICD-10-CM | POA: Diagnosis not present

## 2019-10-25 DIAGNOSIS — R2681 Unsteadiness on feet: Secondary | ICD-10-CM

## 2019-10-25 DIAGNOSIS — R2689 Other abnormalities of gait and mobility: Secondary | ICD-10-CM

## 2019-10-25 DIAGNOSIS — R278 Other lack of coordination: Secondary | ICD-10-CM

## 2019-10-25 NOTE — Therapy (Signed)
[place] MAIN [facility] SERVICES [address] [place], [place], [address] Phone: [phone]   Fax:  [phone]  Physical Therapy Treatment  Patient Details  Name: Jose Castro MRN: [id number] Date of Birth: March 01, 1926 Referring Provider (PT): Dr. Letta Pate   Encounter Date: 10/25/2019  PT End of Session - 10/25/19 1423    Visit Number  8    Number of Visits  25    Date for PT Re-Evaluation  12/14/19    PT Start Time  1430    PT Stop Time  1515    PT Time Calculation (min)  45 min    Equipment Utilized During Treatment  Gait belt    Activity Tolerance  Patient tolerated treatment well    Behavior During Therapy  [facility] for tasks assessed/performed;Impulsive       Past Medical History:  Diagnosis Date  . Arthritis   . BPH (benign prostatic hyperplasia)   . Chronic kidney disease    had a kidney stone which per family was a cyst that was removed  . Dysrhythmia   . GERD (gastroesophageal reflux disease)   . History of kidney stones   . Hyperlipidemia   . Hypertension   . Splenic vein thrombosis   . Stroke ([place]) 05/22/2019  . Thrombosis 12/2015   mural  area and no notation of heart attack    Past Surgical History:  Procedure Laterality Date  . APPENDECTOMY    . CATARACT EXTRACTION, BILATERAL    . CYSTOSCOPY WITH INSERTION OF UROLIFT    . KIDNEY STONE SURGERY    . KNEE SURGERY    . PACEMAKER INSERTION N/A 07/13/2018   Procedure: INSERTION PACEMAKER-DUEL CHAMBER INITIAL IMPLANT;  Surgeon: Isaias Cowman, MD;  Location: [facility] ORS;  Service: Cardiovascular;  Laterality: N/A;  . SPINE SURGERY      There were no vitals filed for this visit.  Subjective Assessment - 10/25/19 1422    Subjective  Patient reports he is doing well today.  He reports no pain or soreness upon arrival.  He reports no falls since last visit.  No new questions or concerns at this time.    Patient is accompained by:  Family member    Pertinent History  Pt  is a 85 year old male who presents with imbalance and difficulty with gait following a right frontal CVA with left hemiparesis on 05/22/19.  He has completed inpatient and home health PT, and now presents for OP PT.  He was discharged from inpatient rehab at a supervision level of assistance, ambulating with a RW.  His family notes that he is impulsive and will try to ambulate without his RW at home.  Family is currently providing 24/7 supervision.    Limitations  House hold activities;Walking    How long can you sit comfortably?  no limitations    How long can you walk comfortably?  he requires a RW at home    Patient Stated Goals  get back to normal; want to be able to play golf and go out to play trivia again.  His daughters want him to regain independence with going to the bathroom at night    Currently in Pain?  No/denies            TREATMENT   Therapeutic Exercise:  NuStep 5 min at L2 during history (3 minutes unbilled); STSs from standard chair without UE support(occasional hand support on knees) and Airex pad under feet 2 x 10 CGA throughout; Seated  marches with manual resistance x 10 bilateral Seated clams with manual resistance x 10 bilateral; Seated adductor squeeze with manual resistance x 10 bilateral; Standing hip abduction with red tband x 10 bilateral; Standing hip flexion SLR with red tband x 10 bilateral; Standing hip extension with red band x10 bilaterally Side steps with red tband x 2lengths of // bars in each direction   Neuromuscular Reeducation:  6" step taps on Airex pad alternating LE x 10 on each side; Airex balance with 2.5# bar lifts overhead x 10; Airex balance with ball passes around body to therapist with return catch at varying heights from waist to overhead x 10 each direction; Airex balance with eyes closed and feet apart x 30s; Airex balance with eyes open and feet together x 30s; Heel/toe rocking with faded UE support 3s holds each, final 3  reps performed without UE support;   Pt educated throughout session about proper posture and technique with exercises. Improved exercise technique, movement at target joints, use of target muscles after min to mod verbal, visual, tactile cues.   Pt demonstrated good motivation throughout today's session. He is able to continue with his strengthening exercises and utilized an Airex pad under his feet today when performing sit to stand exercises. He struggles with balance on unstable surfaces especially with his eyes closed and in single leg stance. Pt encouraged to continue his HEP. He will benefit from skilled PT services to address deficits in balance and decrease risk for future falls.                         PT Short Term Goals - 09/23/19 1052      PT SHORT TERM GOAL #1   Title  Pt will be independent with HEP in order to improve strength and balance in order to decrease fall risk and improve function at home and work.    Time  6    Period  Weeks    Status  New    Target Date  11/02/19        PT Long Term Goals - 09/23/19 1053      PT LONG TERM GOAL #1   Title  Pt will improve BERG to >45 in order to decrease his fall risk.    Baseline  09/21/19: 42/56    Time  12    Period  Weeks    Status  New    Target Date  12/14/19      PT LONG TERM GOAL #2   Title  Pt will decrease TUG to below 14 seconds/decrease in order to demonstrate decreased fall risk.    Baseline  09/21/19: 22.19s    Time  12    Period  Weeks    Status  New    Target Date  12/14/19      PT LONG TERM GOAL #3   Title  Pt will decrease 5TSTS by at least 3 seconds in order to demonstrate clinically significant improvement in LE strength.    Baseline  09/21/19: 32.28s    Time  12    Period  Weeks    Status  New    Target Date  12/14/19      PT LONG TERM GOAL #4   Title  Pt will increase his 10MWT by at least 0.13 m/s in order to demonstrate a clinically significant improvement in  gait speed.    Baseline  09/21/19: Self-selected: 20.9 s = 0.48 m/s;  Fastest: 17.1s =0.58 m/s    Time  12    Period  Weeks    Status  New    Target Date  12/14/19            Plan - 10/25/19 1423    Clinical Impression Statement  Pt demonstrated good motivation throughout today's session. He is able to continue with his strengthening exercises and utilized an Airex pad under his feet today when performing sit to stand exercises. He struggles with balance on unstable surfaces especially with his eyes closed and in single leg stance. Pt encouraged to continue his HEP. He will benefit from skilled PT services to address deficits in balance and decrease risk for future falls.    Personal Factors and Comorbidities  Age;Comorbidity 2    Comorbidities  HTN, CVA    Examination-Activity Limitations  Bend;Lift;Squat;Locomotion Level;Stairs;Stand    Examination-Participation Restrictions  Community Activity;Driving    Stability/Clinical Decision Making  Evolving/Moderate complexity    Rehab Potential  Good    PT Frequency  2x / week    PT Duration  12 weeks    PT Treatment/Interventions  ADLs/Self Care Home Management;Canalith Repostioning;Cryotherapy;Electrical Stimulation;Iontophoresis 4mg /ml Dexamethasone;Moist Heat;Traction;Ultrasound;DME Instruction;Gait training;Stair training;Therapeutic activities;Therapeutic exercise;Functional mobility training;Balance training;Neuromuscular re-education;Patient/family education;Manual techniques;Dry needling;Vestibular;Joint Manipulations;Spinal Manipulations    PT Next Visit Plan  progress strength and balance    PT Home Exercise Plan  introduce next session    Consulted and Agree with Plan of Care  Patient;Family member/caregiver    Family Member Consulted  pt's son       Patient will benefit from skilled therapeutic intervention in order to improve the following deficits and impairments:  Abnormal gait, Decreased balance, Decreased endurance,  Decreased mobility, Difficulty walking, Decreased knowledge of precautions, Decreased safety awareness, Decreased activity tolerance, Decreased strength, Hypermobility  Visit Diagnosis: Muscle weakness (generalized)  Unsteadiness on feet  Other abnormalities of gait and mobility     Problem List Patient Active Problem List   Diagnosis Date Noted  . 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 Great toe pain, right   . Lethargy   . CKD (chronic kidney disease), stage II   . Leukocytosis   . Benign essential HTN   . New onset atrial fibrillation ([place])   . Dysphagia, post-stroke   . 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 Acute ischemic right MCA stroke ([place]) 05/25/2019  . Goals of care, counseling/discussion   . Palliative care by specialist   . DNR (do not resuscitate) discussion   . Stroke, acute, embolic ([place]) 78/24/2353  . Persistent proteinuria 07/30/2018  . 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 Chronic kidney disease, stage III (moderate) 06/15/2018  . Leg pain 05/06/2018  . Lymphedema 05/06/2018  . Coagulopathy ([place]) 07/07/2017  . Lumbar spondylosis 03/10/2017  . Elevated uric acid in blood 11/20/2016  . BPH (benign prostatic hyperplasia) 05/29/2016  . Peripheral vascular disease of lower extremity ([place]) 03/03/2016  . Gallstone 12/31/2015  . 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 Atherosclerosis of aorta ([place]) 11/22/2015  . Carotid artery narrowing 11/22/2015  . Diverticulosis of colon 11/22/2015  . Decreased creatinine clearance 11/22/2015  . Arthritis, degenerative 11/22/2015  . Lactose intolerance 11/22/2015  . Basal cell carcinoma 11/22/2015  . Essential hypertension 06/21/2015  . Hyperlipemia 06/21/2015  . GERD (gastroesophageal reflux disease) 06/21/2015  . Calculus of kidney 11/18/2013   Lyndel Safe Mindel Friscia PT, DPT, GCS  Dandra Velardi 10/25/2019,  5:20 PM  [place] MAIN [facility] SERVICES [address] [place], [place], [address] Phone: [phone]   Fax:  [phone]  Name: Jose Castro MRN: [id number] Date of Birth: [birth year]

## 2019-10-25 NOTE — Therapy (Signed)
[place] MAIN [facility] SERVICES [address] [place], [place], [address] Phone: [phone]   Fax:  [phone]  Occupational Therapy Treatment  Patient Details  Name: Jose Castro MRN: [id number] Date of Birth: 1926-08-24 Referring Provider (OT): Steele Sizer   Encounter Date: 10/25/2019  OT End of Session - 10/25/19 1418    Visit Number  8    Number of Visits  24    Date for OT Re-Evaluation  12/14/19    OT Start Time  1345    OT Stop Time  1430    OT Time Calculation (min)  45 min    Activity Tolerance  Patient tolerated treatment well    Behavior During Therapy  [facility] for tasks assessed/performed;Impulsive       Past Medical History:  Diagnosis Date  . Arthritis   . BPH (benign prostatic hyperplasia)   . Chronic kidney disease    had a kidney stone which per family was a cyst that was removed  . Dysrhythmia   . GERD (gastroesophageal reflux disease)   . History of kidney stones   . Hyperlipidemia   . Hypertension   . Splenic vein thrombosis   . Stroke ([place]) 05/22/2019  . Thrombosis 12/2015   mural  area and no notation of heart attack    Past Surgical History:  Procedure Laterality Date  . APPENDECTOMY    . CATARACT EXTRACTION, BILATERAL    . CYSTOSCOPY WITH INSERTION OF UROLIFT    . KIDNEY STONE SURGERY    . KNEE SURGERY    . PACEMAKER INSERTION N/A 07/13/2018   Procedure: INSERTION PACEMAKER-DUEL CHAMBER INITIAL IMPLANT;  Surgeon: Isaias Cowman, MD;  Location: [facility] ORS;  Service: Cardiovascular;  Laterality: N/A;  . SPINE SURGERY      There were no vitals filed for this visit.  Subjective Assessment - 10/25/19 1416    Subjective   Patient reports he went on a day trip yesterday and was gone all day long.    Pertinent History  Pt. is a 85 y.o. male who was admitted to [facility] with a [facility] on 05/21/2019. Pt. went through Inpatient Rehab, and had home health OT, PT, and ST  services. Pt. was previously residing home alone, and was independent with ADLs, and IADLs. Pt. now has family staying with the pt. around the clock.    Patient Stated Goals  to regain functional indpendence safely    Currently in Pain?  No/denies    Pain Score  0-No pain          Therapeutic Exercise: Patient was seen for visual scanning exercises in large environmental setting to scan and pick out specific items throughout the space. Patient approached this more slow and deliberate than last session and was able to identify all items with good accuracy. Upper strength extremity strengthening with 5 pound dowel for shoulder flexion, chest press, abduction/add duction, diagonal patterns, and elbow flexion and extension for two sets of 12 repetitions each. Therapist providing visual demonstration and occasional verbal cues for form and technique. Patient engaging in bilateral reciprocal resistive arm exercises with resistance from 4.0 to 4.4 for eight minutes, forwards and backwards motion with alternating levels of resistance. Therapist and constant attendance to adjust settings ensure grip and patients form.       Response to tx:  Adjustments made to treatment session this date due to patient and family forgetting to bring in food items for cooking session. Will plan  to defer cooking until next session when items are available. Patient easily distracted during session and has to be redirected to exercises. He continues to require occasional verbal cues and therapist demonstration for proper form and technique. Patient continues to make good progress. Continue with goals towards plan of care to improve safety and independence in necessary daily tasks.           OT Education - 10/25/19 1417    Education Details  visual scanning    Person(s) Educated  Patient    Methods  Explanation;Demonstration    Comprehension  Verbalized understanding;Returned demonstration;Verbal cues required           OT Long Term Goals - 09/21/19 1726      OT LONG TERM GOAL #1   Title  Pt. will demonstrate home management tasks with supervision initating safe strategies    Baseline  Eval: Decreased safety awareness    Time  12    Period  Weeks    Status  New    Target Date  12/14/19      OT LONG TERM GOAL #2   Title  Pt. will be able to identify 100% of potential safety hazards in a simulated home environment during ADLs, and IADLs.    Baseline  Eval: Limited safety awareness    Time  12    Period  Weeks    Status  New    Target Date  12/14/19      OT LONG TERM GOAL #3   Title  Pt. will increase left grip strength by 5# of force to assist with holding items duirng ADLs, and IADLs.    Baseline  Eval: decreased left grip strength    Time  12    Period  Weeks    Status  New    Target Date  12/14/19      OT LONG TERM GOAL #4   Title  Pt. will improve left hand [facility] skills by 3 sec of speed to be able to manipulate objects during ADLs, and IADL tasks.    Baseline  Eval: Pt. has difficulty    Time  12    Period  Weeks    Target Date  12/14/19      OT LONG TERM GOAL #5   Title  Pt. will demonstrate visual compensatory strategies as needed during ADLs, and IADL tasks.    Baseline  Eval: Limited left sided awareness    Time  12    Period  Weeks    Status  New    Target Date  12/14/19      Long Term Additional Goals   Additional Long Term Goals  Yes      OT LONG TERM GOAL #6   Title  Pt. will demonstrate cognitive compensatory strategies during ADLs, and IADLs    Baseline  Eval: Limited cognition/problem solving, impulsive    Time  12    Period  Weeks    Status  New    Target Date  12/14/19            Plan - 10/25/19 1418    Clinical Impression Statement  Adjustments made to treatment session this date due to patient and family forgetting to bring in food items for cooking session. Will plan to defer cooking until next session when items are available. Patient  easily distracted during session and has to be redirected to exercises. He continues to require occasional verbal cues and therapist demonstration for proper  form and technique. Patient continues to make good progress. Continue with goals towards plan of care to improve safety and independence in necessary daily tasks.    OT Occupational Profile and History  Detailed Assessment- Review of Records and additional review of physical, cognitive, psychosocial history related to current functional performance    Occupational performance deficits (Please refer to evaluation for details):  ADL's;IADL's    Body Structure / Function / Physical Skills  ADL;FMC;ROM;IADL;Strength;Decreased knowledge of use of DME;UE functional use    Rehab Potential  Excellent    Clinical Decision Making  Several treatment options, min-mod task modification necessary    Comorbidities Affecting Occupational Performance:  May have comorbidities impacting occupational performance    Modification or Assistance to Complete Evaluation   Min-Moderate modification of tasks or assist with assess necessary to complete eval    OT Frequency  2x / week    OT Duration  12 weeks    OT Treatment/Interventions  Self-care/ADL training;DME and/or AE instruction;Therapeutic exercise;Neuromuscular education;Patient/family education;Therapeutic activities    Consulted and Agree with Plan of Care  Patient       Patient will benefit from skilled therapeutic intervention in order to improve the following deficits and impairments:   Body Structure / Function / Physical Skills: ADL, FMC, ROM, IADL, Strength, Decreased knowledge of use of DME, UE functional use       Visit Diagnosis: Muscle weakness (generalized)  Other lack of coordination  Unsteadiness on feet    Problem List Patient Active Problem List   Diagnosis Date Noted  . 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 Great toe pain, right   . Lethargy   . CKD (chronic kidney  disease), stage II   . Leukocytosis   . Benign essential HTN   . New onset atrial fibrillation ([place])   . Dysphagia, post-stroke   . 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 Acute ischemic right MCA stroke ([place]) 05/25/2019  . Goals of care, counseling/discussion   . Palliative care by specialist   . DNR (do not resuscitate) discussion   . Stroke, acute, embolic ([place]) 32/95/1884  . Persistent proteinuria 07/30/2018  . 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 Chronic kidney disease, stage III (moderate) 06/15/2018  . Leg pain 05/06/2018  . Lymphedema 05/06/2018  . Coagulopathy ([place]) 07/07/2017  . Lumbar spondylosis 03/10/2017  . Elevated uric acid in blood 11/20/2016  . BPH (benign prostatic hyperplasia) 05/29/2016  . Peripheral vascular disease of lower extremity ([place]) 03/03/2016  . Gallstone 12/31/2015  . 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 Atherosclerosis of aorta ([place]) 11/22/2015  . Carotid artery narrowing 11/22/2015  . Diverticulosis of colon 11/22/2015  . Decreased creatinine clearance 11/22/2015  . Arthritis, degenerative 11/22/2015  . Lactose intolerance 11/22/2015  . Basal cell carcinoma 11/22/2015  . Essential hypertension 06/21/2015  . Hyperlipemia 06/21/2015  . GERD (gastroesophageal reflux disease) 06/21/2015  . Calculus of kidney 11/18/2013   Achilles Dunk, OTR/L, CLT  Lovett,Amy 10/26/2019, 12:46 PM  [place] MAIN [facility] SERVICES [address] [place], [place], [address] Phone: [phone]   Fax:  [phone]  Name: Jose Castro MRN: [id number] Date of Birth: [birth year]

## 2019-10-26 ENCOUNTER — Ambulatory Visit: Payer: Medicare Other

## 2019-10-31 ENCOUNTER — Ambulatory Visit: Payer: Medicare Other

## 2019-11-01 ENCOUNTER — Other Ambulatory Visit: Payer: Self-pay

## 2019-11-01 ENCOUNTER — Ambulatory Visit: Payer: Medicare Other | Attending: Physical Medicine & Rehabilitation

## 2019-11-01 ENCOUNTER — Ambulatory Visit: Payer: Medicare Other | Admitting: Occupational Therapy

## 2019-11-01 DIAGNOSIS — R2681 Unsteadiness on feet: Secondary | ICD-10-CM | POA: Diagnosis present

## 2019-11-01 DIAGNOSIS — R29898 Other symptoms and signs involving the musculoskeletal system: Secondary | ICD-10-CM | POA: Diagnosis present

## 2019-11-01 DIAGNOSIS — M6281 Muscle weakness (generalized): Secondary | ICD-10-CM | POA: Insufficient documentation

## 2019-11-01 DIAGNOSIS — I63511 Cerebral infarction due to unspecified occlusion or stenosis of right middle cerebral artery: Secondary | ICD-10-CM | POA: Insufficient documentation

## 2019-11-01 DIAGNOSIS — R278 Other lack of coordination: Secondary | ICD-10-CM | POA: Diagnosis present

## 2019-11-01 NOTE — Therapy (Signed)
[place] MAIN [facility] SERVICES [address] [place], [place], [address] Phone: [phone]   Fax:  [phone]  Physical Therapy Treatment  Patient Details  Name: Jose Castro MRN: [id number] Date of Birth: [birth year] Referring Provider (PT): Dr. Letta Pate   Encounter Date: 11/01/2019  PT End of Session - 11/01/19 1448    Visit Number  9    Number of Visits  25    PT Start Time  0093    PT Stop Time  1505    PT Time Calculation (min)  30 min    Equipment Utilized During Treatment  Gait belt    Activity Tolerance  Patient tolerated treatment well    Behavior During Therapy  [facility] for tasks assessed/performed;Impulsive       Past Medical History:  Diagnosis Date  . Arthritis   . BPH (benign prostatic hyperplasia)   . Chronic kidney disease    had a kidney stone which per family was a cyst that was removed  . Dysrhythmia   . GERD (gastroesophageal reflux disease)   . History of kidney stones   . Hyperlipidemia   . Hypertension   . Splenic vein thrombosis   . Stroke ([place]) 05/22/2019  . Thrombosis 12/2015   mural  area and no notation of heart attack    Past Surgical History:  Procedure Laterality Date  . APPENDECTOMY    . CATARACT EXTRACTION, BILATERAL    . CYSTOSCOPY WITH INSERTION OF UROLIFT    . KIDNEY STONE SURGERY    . KNEE SURGERY    . PACEMAKER INSERTION N/A 07/13/2018   Procedure: INSERTION PACEMAKER-DUEL CHAMBER INITIAL IMPLANT;  Surgeon: Isaias Cowman, MD;  Location: [facility] ORS;  Service: Cardiovascular;  Laterality: N/A;  . SPINE SURGERY      There were no vitals filed for this visit.  Subjective Assessment - 11/01/19 1442    Subjective  Pt reports he is doing well today.  He has no new complaints.  He states he has not fallen since last visit.    Pertinent History  Pt is a 85 year old male who presents with imbalance and difficulty with gait following a right frontal CVA with left hemiparesis on 05/22/19.  He has  completed inpatient and home health PT, and now presents for OP PT.  He was discharged from inpatient rehab at a supervision level of assistance, ambulating with a RW.  His family notes that he is impulsive and will try to ambulate without his RW at home.  Family is currently providing 24/7 supervision.    Currently in Pain?  No/denies       INTERVENTION THIS DATE: Therapeutic Exercise -STS from chair 2x10 hands free -Standing Leg exercises with 2lb Ankle weights in // bars: marching x 20, hip extension, hamstring curls -lateral side stepping in // bars 2x each direction  Neuromuscular Reeducation  -marching in place hands free 1x20, firm surface -normal stance on airex pad headturns, minGuard assist to minA 10x bilat -normal stance airex pad with trunk/head rotation and ball grab/delivery 1x10  -normal stance airex , overhead head turns (limited by ROM limitations in neck), most provocative to balance    PT Short Term Goals - 09/23/19 1052      PT SHORT TERM GOAL #1   Title  Pt will be independent with HEP in order to improve strength and balance in order to decrease fall risk and improve function at home and work.    Time  6  Period  Weeks    Status  New    Target Date  11/02/19        PT Long Term Goals - 09/23/19 1053      PT LONG TERM GOAL #1   Title  Pt will improve BERG to >45 in order to decrease his fall risk.    Baseline  09/21/19: 42/56    Time  12    Period  Weeks    Status  New    Target Date  12/14/19      PT LONG TERM GOAL #2   Title  Pt will decrease TUG to below 14 seconds/decrease in order to demonstrate decreased fall risk.    Baseline  09/21/19: 22.19s    Time  12    Period  Weeks    Status  New    Target Date  12/14/19      PT LONG TERM GOAL #3   Title  Pt will decrease 5TSTS by at least 3 seconds in order to demonstrate clinically significant improvement in LE strength.    Baseline  09/21/19: 32.28s    Time  12    Period  Weeks    Status   New    Target Date  12/14/19      PT LONG TERM GOAL #4   Title  Pt will increase his 10MWT by at least 0.13 m/s in order to demonstrate a clinically significant improvement in gait speed.    Baseline  09/21/19: Self-selected: 20.9 s = 0.48 m/s; Fastest: 17.1s =0.58 m/s    Time  12    Period  Weeks    Status  New    Target Date  12/14/19         Plan - 11/01/19 1450    Clinical Impression Statement  Pt tolerated treatment session well today.  He was most challenged with dual task balance exercises and required CGA intermittently to regain balance on airex. Head turns present the most challenge to stability in intervention. Pt's DTR at end of session has questions regarding updates to recommendations for safety in home and assistive device use. Pt requires heavy cuing to attend to task, pt not able to attend to counting reps in session. Heavy multimodal cues provided for best possible form in performance of exercise.    Personal Factors and Comorbidities  Age;Comorbidity 2    Examination-Activity Limitations  Bend;Lift;Squat;Locomotion Level;Stairs;Stand    Examination-Participation Restrictions  Community Activity;Driving    Stability/Clinical Decision Making  Evolving/Moderate complexity    Rehab Potential  Good    PT Frequency  2x / week    PT Duration  12 weeks    PT Treatment/Interventions  ADLs/Self Care Home Management;Canalith Repostioning;Cryotherapy;Electrical Stimulation;Iontophoresis 4mg /ml Dexamethasone;Moist Heat;Traction;Ultrasound;DME Instruction;Gait training;Stair training;Therapeutic activities;Therapeutic exercise;Functional mobility training;Balance training;Neuromuscular re-education;Patient/family education;Manual techniques;Dry needling;Vestibular;Joint Manipulations;Spinal Manipulations    PT Next Visit Plan  progress strength and balance       Patient will benefit from skilled therapeutic intervention in order to improve the following deficits and impairments:   Abnormal gait, Decreased balance, Decreased endurance, Decreased mobility, Difficulty walking, Decreased knowledge of precautions, Decreased safety awareness, Decreased activity tolerance, Decreased strength, Hypermobility  Visit Diagnosis: Muscle weakness (generalized)  Other lack of coordination  Unsteadiness on feet  Decreased strength of lower extremity     Problem List Patient Active Problem List   Diagnosis Date Noted  . 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 Great toe pain, right   . Lethargy   .  CKD (chronic kidney disease), stage II   . Leukocytosis   . Benign essential HTN   . New onset atrial fibrillation ([place])   . Dysphagia, post-stroke   . 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 Acute ischemic right MCA stroke ([place]) 05/25/2019  . Goals of care, counseling/discussion   . Palliative care by specialist   . DNR (do not resuscitate) discussion   . Stroke, acute, embolic ([place]) 89/37/3428  . Persistent proteinuria 07/30/2018  . 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 Chronic kidney disease, stage III (moderate) 06/15/2018  . Leg pain 05/06/2018  . Lymphedema 05/06/2018  . Coagulopathy ([place]) 07/07/2017  . Lumbar spondylosis 03/10/2017  . Elevated uric acid in blood 11/20/2016  . BPH (benign prostatic hyperplasia) 05/29/2016  . Peripheral vascular disease of lower extremity ([place]) 03/03/2016  . Gallstone 12/31/2015  . 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 Atherosclerosis of aorta ([place]) 11/22/2015  . Carotid artery narrowing 11/22/2015  . Diverticulosis of colon 11/22/2015  . Decreased creatinine clearance 11/22/2015  . Arthritis, degenerative 11/22/2015  . Lactose intolerance 11/22/2015  . Basal cell carcinoma 11/22/2015  . Essential hypertension 06/21/2015  . Hyperlipemia 06/21/2015  . GERD (gastroesophageal reflux disease) 06/21/2015  . Calculus of kidney 11/18/2013    3:42 PM,  11/01/19 Etta Grandchild, PT, DPT Physical Therapist - [facility]  [phone])     [place] MAIN [facility] SERVICES [address] [place], [place], [address] Phone: [phone]   Fax:  [phone]  Name: Jose Castro MRN: [id number] Date of Birth: 1926/11/20

## 2019-11-02 ENCOUNTER — Encounter: Payer: Self-pay | Admitting: Occupational Therapy

## 2019-11-02 ENCOUNTER — Ambulatory Visit: Payer: Medicare Other

## 2019-11-02 NOTE — Therapy (Signed)
[place] MAIN [facility] SERVICES [address] [place], [place], [address] Phone: [phone]   Fax:  [phone]  Occupational Therapy Treatment  Patient Details  Name: Jose Castro MRN: [id number] Date of Birth: March 25, 1926 Referring Provider (OT): Steele Sizer   Encounter Date: 11/01/2019  OT End of Session - 11/02/19 1545    Visit Number  9    Number of Visits  24    Date for OT Re-Evaluation  12/14/19    OT Start Time  1345    OT Stop Time  1431    OT Time Calculation (min)  46 min    Activity Tolerance  Patient tolerated treatment well    Behavior During Therapy  [facility] for tasks assessed/performed;Impulsive       Past Medical History:  Diagnosis Date  . Arthritis   . BPH (benign prostatic hyperplasia)   . Chronic kidney disease    had a kidney stone which per family was a cyst that was removed  . Dysrhythmia   . GERD (gastroesophageal reflux disease)   . History of kidney stones   . Hyperlipidemia   . Hypertension   . Splenic vein thrombosis   . Stroke ([place]) 05/22/2019  . Thrombosis 12/2015   mural  area and no notation of heart attack    Past Surgical History:  Procedure Laterality Date  . APPENDECTOMY    . CATARACT EXTRACTION, BILATERAL    . CYSTOSCOPY WITH INSERTION OF UROLIFT    . KIDNEY STONE SURGERY    . KNEE SURGERY    . PACEMAKER INSERTION N/A 07/13/2018   Procedure: INSERTION PACEMAKER-DUEL CHAMBER INITIAL IMPLANT;  Surgeon: Isaias Cowman, MD;  Location: [facility] ORS;  Service: Cardiovascular;  Laterality: N/A;  . SPINE SURGERY      There were no vitals filed for this visit.  Subjective Assessment - 11/02/19 1543    Subjective   Patient's daughter [name] present this date and asking for update on progress and any changes in his plan.  She is concerned he walks a couple steps out of the walker and often holds onto on handle from the side.    Pertinent History  Pt. is a 85 y.o. male who was admitted to [facility] with a [facility] Infarct on 05/21/2019. Pt. went through Inpatient Rehab, and had home health OT, PT, and ST services. Pt. was previously residing home alone, and was independent with ADLs, and IADLs. Pt. now has family staying with the pt. around the clock.    Patient Stated Goals  to regain functional indpendence safely    Currently in Pain?  No/denies    Pain Score  0-No pain      Daughter reports she did not realize they needed to bring in food items for kitchen session, patient states, "I can cook at home, I didn't bring them because I didn't want to do it."  ADL: Patient seen for managing buttons, patient performing 2 types of buttons, small plastic buttons and small metal buttons.  Performed multiple trials with cues to work on opening hands and fingers with big movements prior to performing buttons.  Patient able to complete with increased time, reports some of his buttons are tighter at home and more difficult.  One trial patient did not align the buttons correctly and had a hard time trying to figure out why there was no button for the bottom hole.  Patient also seen for visual tasks scanning his environment to identify  items.  He felt he saw, identified each item by saying it outloud.  He missed 2 items but would not accept the fact that he missed them, he states to therapist, "I didn't miss them, you just weren't paying attention and didn't hear me call them out".  When pointing out the 2 items he missed, he refused to accept it.  Going forward, therapist will plan to point out items missed in real time instead of at the end of the activity.   Discussed positioning of self in relation to walker with and without walker tray in place, demonstrations provided to patient and daughter.  Patient demonstrates understanding however daughter reports, "he may still just do what he wants. "  Therapeutic Exercises:  Patient seen for UB strengthening tasks with 2# wrist weights with  completing 4 level graduated reaching tower.    Response to tx: Patient demonstrates difficulty with accepting constructive feedback, he does not feel he is wrong and would not accept the fact he missed 2 items this date with visual scanning task.  He becomes argumentative at times but therapist is able to redirect patient to next task.  Patient reports he does not like the walker tray and family voices concerns over him moving items such as a plate, cup or food container without using his walker.  We discussed and practiced positioning of self with walker when tray is present and when tray is not present.  He demonstrates understanding but he may not be consistent with this recommendation at home.                    OT Education - 11/02/19 1545    Education Details  safety with walker during functional tasks, visual scanning.    Person(s) Educated  Patient    Methods  Explanation;Demonstration    Comprehension  Verbalized understanding;Returned demonstration;Verbal cues required          OT Long Term Goals - 09/21/19 1726      OT LONG TERM GOAL #1   Title  Pt. will demonstrate home management tasks with supervision initating safe strategies    Baseline  Eval: Decreased safety awareness    Time  12    Period  Weeks    Status  New    Target Date  12/14/19      OT LONG TERM GOAL #2   Title  Pt. will be able to identify 100% of potential safety hazards in a simulated home environment during ADLs, and IADLs.    Baseline  Eval: Limited safety awareness    Time  12    Period  Weeks    Status  New    Target Date  12/14/19      OT LONG TERM GOAL #3   Title  Pt. will increase left grip strength by 5# of force to assist with holding items duirng ADLs, and IADLs.    Baseline  Eval: decreased left grip strength    Time  12    Period  Weeks    Status  New    Target Date  12/14/19      OT LONG TERM GOAL #4   Title  Pt. will improve left hand [facility] skills by 3 sec of speed  to be able to manipulate objects during ADLs, and IADL tasks.    Baseline  Eval: Pt. has difficulty    Time  12    Period  Weeks    Target Date  12/14/19  OT LONG TERM GOAL #5   Title  Pt. will demonstrate visual compensatory strategies as needed during ADLs, and IADL tasks.    Baseline  Eval: Limited left sided awareness    Time  12    Period  Weeks    Status  New    Target Date  12/14/19      Long Term Additional Goals   Additional Long Term Goals  Yes      OT LONG TERM GOAL #6   Title  Pt. will demonstrate cognitive compensatory strategies during ADLs, and IADLs    Baseline  Eval: Limited cognition/problem solving, impulsive    Time  12    Period  Weeks    Status  New    Target Date  12/14/19            Plan - 11/02/19 1600    Clinical Impression Statement  Patient demonstrates difficulty with accepting constructive feedback, he does not feel he is wrong and would not accept the fact he missed 2 items this date with visual scanning task.  He becomes argumentative at times but therapist is able to redirect patient to next task.  Patient reports he does not like the walker tray and family voices concerns over him moving items such as a plate, cup or food container without using his walker.  We discussed and practiced positioning of self with walker when tray is present and when tray is not present.  He demonstrates understanding but he may not be consistent with this recommendation at home.       Patient will benefit from skilled therapeutic intervention in order to improve the following deficits and impairments:   Body Structure / Function / Physical Skills: ADL, FMC, ROM, IADL, Strength, Decreased knowledge of use of DME, UE functional use       Visit Diagnosis: Muscle weakness (generalized)  Other lack of coordination  Unsteadiness on feet  Right middle cerebral artery stroke [facility])    Problem List Patient Active Problem List   Diagnosis Date Noted  .  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 Great toe pain, right   . Lethargy   . CKD (chronic kidney disease), stage II   . Leukocytosis   . Benign essential HTN   . New onset atrial fibrillation ([place])   . Dysphagia, post-stroke   . 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 Acute ischemic right MCA stroke ([place]) 05/25/2019  . Goals of care, counseling/discussion   . Palliative care by specialist   . DNR (do not resuscitate) discussion   . Stroke, acute, embolic ([place]) 35/36/1443  . Persistent proteinuria 07/30/2018  . 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 Chronic kidney disease, stage III (moderate) 06/15/2018  . Leg pain 05/06/2018  . Lymphedema 05/06/2018  . Coagulopathy ([place]) 07/07/2017  . Lumbar spondylosis 03/10/2017  . Elevated uric acid in blood 11/20/2016  . BPH (benign prostatic hyperplasia) 05/29/2016  . Peripheral vascular disease of lower extremity ([place]) 03/03/2016  . Gallstone 12/31/2015  . 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 Atherosclerosis of aorta ([place]) 11/22/2015  . Carotid artery narrowing 11/22/2015  . Diverticulosis of colon 11/22/2015  . Decreased creatinine clearance 11/22/2015  . Arthritis, degenerative 11/22/2015  . Lactose intolerance 11/22/2015  . Basal cell carcinoma 11/22/2015  . Essential hypertension 06/21/2015  . Hyperlipemia 06/21/2015  . GERD (gastroesophageal reflux disease) 06/21/2015  . Calculus of kidney 11/18/2013  Achilles Dunk, OTR/L, CLT  Yader Criger 11/02/2019, 4:02 PM  [place] MAIN [facility] SERVICES [address] [place], [place], [address] Phone: [phone]   Fax:  [phone]  Name: Jose Castro MRN: [id number] Date of Birth: 1926/09/09

## 2019-11-03 ENCOUNTER — Ambulatory Visit: Payer: Medicare Other

## 2019-11-03 ENCOUNTER — Other Ambulatory Visit: Payer: Self-pay

## 2019-11-03 ENCOUNTER — Ambulatory Visit: Payer: Medicare Other | Admitting: Occupational Therapy

## 2019-11-03 ENCOUNTER — Encounter: Payer: Self-pay | Admitting: Occupational Therapy

## 2019-11-03 DIAGNOSIS — M6281 Muscle weakness (generalized): Secondary | ICD-10-CM | POA: Diagnosis not present

## 2019-11-03 DIAGNOSIS — I63511 Cerebral infarction due to unspecified occlusion or stenosis of right middle cerebral artery: Secondary | ICD-10-CM

## 2019-11-03 DIAGNOSIS — R2681 Unsteadiness on feet: Secondary | ICD-10-CM

## 2019-11-03 DIAGNOSIS — R278 Other lack of coordination: Secondary | ICD-10-CM

## 2019-11-03 NOTE — Therapy (Signed)
[place] MAIN [facility] SERVICES [address] [place], [place], [address] Phone: [phone]   Fax:  [phone]  Physical Therapy Progress Note   Dates of reporting period  09/21/19   to   11/03/19  Patient Details  Name: Jose Castro MRN: [id number] Date of Birth: 1926-01-27 Referring Provider (PT): Dr. Letta Pate   Encounter Date: 11/03/2019  PT End of Session - 11/03/19 1535    Visit Number  10    Number of Visits  25    Date for PT Re-Evaluation  12/14/19    PT Start Time  1430    PT Stop Time  1515    PT Time Calculation (min)  45 min    Equipment Utilized During Treatment  Gait belt    Activity Tolerance  Patient tolerated treatment well    Behavior During Therapy  [facility] for tasks assessed/performed       Past Medical History:  Diagnosis Date  . Arthritis   . BPH (benign prostatic hyperplasia)   . Chronic kidney disease    had a kidney stone which per family was a cyst that was removed  . Dysrhythmia   . GERD (gastroesophageal reflux disease)   . History of kidney stones   . Hyperlipidemia   . Hypertension   . Splenic vein thrombosis   . Stroke ([place]) 05/22/2019  . Thrombosis 12/2015   mural  area and no notation of heart attack    Past Surgical History:  Procedure Laterality Date  . APPENDECTOMY    . CATARACT EXTRACTION, BILATERAL    . CYSTOSCOPY WITH INSERTION OF UROLIFT    . KIDNEY STONE SURGERY    . KNEE SURGERY    . PACEMAKER INSERTION N/A 07/13/2018   Procedure: INSERTION PACEMAKER-DUEL CHAMBER INITIAL IMPLANT;  Surgeon: Isaias Cowman, MD;  Location: [facility] ORS;  Service: Cardiovascular;  Laterality: N/A;  . SPINE SURGERY      There were no vitals filed for this visit.  Subjective Assessment - 11/03/19 1433    Subjective  Pt reports he is doing well today.  He has no new complaints.  He states he has not fallen since last visit. Accompanied by granddaughter today who would like to know if pt is safe to  ambulate around the house, specifically at night, without contact guard by a family member.    Pertinent History  Pt is a 85 year old male who presents with imbalance and difficulty with gait following a right frontal CVA with left hemiparesis on 05/22/19.  He has completed inpatient and home health PT, and now presents for OP PT.  He was discharged from inpatient rehab at a supervision level of assistance, ambulating with a RW.  His family notes that he is impulsive and will try to ambulate without his RW at home.  Family is currently providing 24/7 supervision.    How long can you sit comfortably?  no limitations    How long can you walk comfortably?  he requires a RW at home    Patient Stated Goals  get back to normal; want to be able to play golf and go out to play trivia again.  His daughters want him to regain independence with going to the bathroom at night    Currently in Pain?  No/denies         [facility] PT Assessment - 11/03/19 1446      Berg Balance Test   Sit to Stand  Able to stand without using hands  and stabilize independently    Standing Unsupported  Able to stand safely 2 minutes    Sitting with Back Unsupported but Feet Supported on Floor or Stool  Able to sit safely and securely 2 minutes    Stand to Sit  Sits safely with minimal use of hands    Transfers  Able to transfer safely, minor use of hands    Standing Unsupported with Eyes Closed  Able to stand 10 seconds safely    Standing Unsupported with Feet Together  Able to place feet together independently and stand 1 minute safely    From Standing, Reach Forward with Outstretched Arm  Can reach confidently >25 cm (10")    From Standing Position, Pick up Object from Floor  Able to pick up shoe, needs supervision    From Standing Position, Turn to Look Behind Over each Shoulder  Looks behind one side only/other side shows less weight shift    Turn 360 Degrees  Able to turn 360 degrees safely but slowly    Standing Unsupported,  Alternately Place Feet on Step/Stool  Able to stand independently and safely and complete 8 steps in 20 seconds    Standing Unsupported, One Foot in Front  Able to take small step independently and hold 30 seconds    Standing on One Leg  Tries to lift leg/unable to hold 3 seconds but remains standing independently    Total Score  47          TREATMENT   Therapeutic Exercise Updated outcome measures with patient including: 5TSTS: 15.5s BERG: 47/56 TUG: 19.0s 7mgait speed: Self-selected: 17.7s s = 0.56 m/s; Fastest: 13.5s =0.74 m/s with walker  STSs from standard chair without UE support(occasional hand support on knees) and Airex pad under feet 2 x 5 CGA throughout; Seated marches with manual resistance 2 x 10 bilateral Seated clams with manual resistance 2 x 10 bilateral; Seated adductor squeeze with manual resistance 2 x 10 bilateral; Seated heel raises with manual resistance 2 x 10; Squats with bottom touching or coming as close to chair as pt is able, cues provided for proper technique, 2 x 10;   Pt educated throughout session about proper posture and technique with exercises. Improved exercise technique, movement at target joints, use of target muscles after min to mod verbal, visual, tactile cues.   Updated outcome measures and goals with patient today. He demonstrates improvement in all of his outcome measures. He performed faster 158mait speed both self-selected and fastest. His balance is also improved with BERG increasing from 42/56 to 47/56 today. TUG dropped to 19.0s today and 5TSTS dropped from 32.3s initially to 15.5s today. Pt demonstrated good motivation throughout today's session and is able to complete some additional strengthening exercises after performing his outcome measures. He is safe to ambulate around the home without contact guard by family with the use of a rolling walker. He denies any falls or near falls since his CVA. Obviously he will have to talk  with his family members regarding their comfort level with these recommendations. Pt encouraged to continue his HEP. He will benefit from skilled PT services to address deficits in balance and decrease risk for future falls.                         PT Short Term Goals - 11/03/19 1609      PT SHORT TERM GOAL #1   Title  Pt will be independent with  HEP in order to improve strength and balance in order to decrease fall risk and improve function at home and work.    Time  6    Period  Weeks    Status  On-going    Target Date  11/02/19        PT Long Term Goals - 11/03/19 1609      PT LONG TERM GOAL #1   Title  Pt will improve BERG to >50 in order to decrease his fall risk.    Baseline  09/21/19: 42/56, 11/03/19: 47/56    Time  12    Period  Weeks    Status  Revised    Target Date  12/14/19      PT LONG TERM GOAL #2   Title  Pt will decrease TUG to below 14 seconds/decrease in order to demonstrate decreased fall risk.    Baseline  09/21/19: 22.19s; 11/03/19: 19.0s    Time  12    Period  Weeks    Status  Partially Met    Target Date  12/14/19      PT LONG TERM GOAL #3   Title  Pt will decrease 5TSTS by at least 3 seconds in order to demonstrate clinically significant improvement in LE strength.    Baseline  09/21/19: 32.28s; 11/03/19: 15.5s    Time  12    Period  Weeks    Status  Achieved      PT LONG TERM GOAL #4   Title  Pt will increase his 10MWT by at least 0.13 m/s in order to demonstrate a clinically significant improvement in gait speed.    Baseline  09/21/19: Self-selected: 20.9 s = 0.48 m/s; Fastest: 17.1s =0.58 m/s; 11/03/19: Self-selected: 17.7s s = 0.56 m/s; Fastest: 13.5s =0.74 m/s with walker    Time  12    Period  Weeks    Status  Partially Met    Target Date  12/14/19            Plan - 11/03/19 1535    Clinical Impression Statement  Updated outcome measures and goals with patient today. He demonstrates improvement in all of his  outcome measures. He performed faster 29mgait speed both self-selected and fastest. His balance is also improved with BERG increasing from 42/56 to 47/56 today. TUG dropped to 19.0s today and 5TSTS dropped from 32.3s initially to 15.5s today. Pt demonstrated good motivation throughout today's session and is able to complete some additional strengthening exercises after performing his outcome measures. He is safe to ambulate around the home without contact guard by family with the use of a rolling walker. He denies any falls or near falls since his CVA. Obviously he will have to talk with his family members regarding their comfort level with these recommendations. Pt encouraged to continue his HEP. He will benefit from skilled PT services to address deficits in balance and decrease risk for future falls.    Personal Factors and Comorbidities  Age;Comorbidity 2    Examination-Activity Limitations  Bend;Lift;Squat;Locomotion Level;Stairs;Stand    Examination-Participation Restrictions  Community Activity;Driving    Stability/Clinical Decision Making  Evolving/Moderate complexity    Rehab Potential  Good    PT Frequency  2x / week    PT Duration  12 weeks    PT Treatment/Interventions  ADLs/Self Care Home Management;Canalith Repostioning;Cryotherapy;Electrical Stimulation;Iontophoresis 421mml Dexamethasone;Moist Heat;Traction;Ultrasound;DME Instruction;Gait training;Stair training;Therapeutic activities;Therapeutic exercise;Functional mobility training;Balance training;Neuromuscular re-education;Patient/family education;Manual techniques;Dry needling;Vestibular;Joint Manipulations;Spinal Manipulations    PT Next Visit Plan  progress strength and balance    PT Home Exercise Plan  Needs review    Consulted and Agree with Plan of Care  Patient;Family member/caregiver    Family Member Consulted  Patient's granddaughter       Patient will benefit from skilled therapeutic intervention in order to improve the  following deficits and impairments:  Abnormal gait, Decreased balance, Decreased endurance, Decreased mobility, Difficulty walking, Decreased knowledge of precautions, Decreased safety awareness, Decreased activity tolerance, Decreased strength, Hypermobility  Visit Diagnosis: Muscle weakness (generalized)  Unsteadiness on feet     Problem List Patient Active Problem List   Diagnosis Date Noted  . 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 Great toe pain, right   . Lethargy   . CKD (chronic kidney disease), stage II   . Leukocytosis   . Benign essential HTN   . New onset atrial fibrillation ([place])   . Dysphagia, post-stroke   . 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 Acute ischemic right MCA stroke ([place]) 05/25/2019  . Goals of care, counseling/discussion   . Palliative care by specialist   . DNR (do not resuscitate) discussion   . Stroke, acute, embolic ([place]) 40/37/0964  . Persistent proteinuria 07/30/2018  . 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 Chronic kidney disease, stage III (moderate) 06/15/2018  . Leg pain 05/06/2018  . Lymphedema 05/06/2018  . Coagulopathy ([place]) 07/07/2017  . Lumbar spondylosis 03/10/2017  . Elevated uric acid in blood 11/20/2016  . BPH (benign prostatic hyperplasia) 05/29/2016  . Peripheral vascular disease of lower extremity ([place]) 03/03/2016  . Gallstone 12/31/2015  . 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 Atherosclerosis of aorta ([place]) 11/22/2015  . Carotid artery narrowing 11/22/2015  . Diverticulosis of colon 11/22/2015  . Decreased creatinine clearance 11/22/2015  . Arthritis, degenerative 11/22/2015  . Lactose intolerance 11/22/2015  . Basal cell carcinoma 11/22/2015  . Essential hypertension 06/21/2015  . Hyperlipemia 06/21/2015  . GERD (gastroesophageal reflux disease) 06/21/2015  . Calculus of kidney 11/18/2013   Phillips Grout PT, DPT, GCS   Neema Fluegge 11/03/2019, 4:11 PM  [place] MAIN [facility] SERVICES [address] [place], [place], [address] Phone: [phone]   Fax:  [phone]  Name: Jose Castro MRN: [id number] Date of Birth: Sep 09, 1926
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
## 2019-11-07 ENCOUNTER — Ambulatory Visit: Payer: Medicare Other

## 2019-11-08 ENCOUNTER — Encounter: Payer: Medicare Other | Attending: Registered Nurse | Admitting: Physical Medicine & Rehabilitation

## 2019-11-08 ENCOUNTER — Encounter: Payer: Self-pay | Admitting: Physical Medicine & Rehabilitation

## 2019-11-08 ENCOUNTER — Other Ambulatory Visit: Payer: Self-pay

## 2019-11-08 VITALS — BP 108/71 | HR 62 | Temp 97.7°F | Ht 70.5 in | Wt 173.0 lb

## 2019-11-08 DIAGNOSIS — M19071 Primary osteoarthritis, right ankle and foot: Secondary | ICD-10-CM | POA: Insufficient documentation

## 2019-11-08 DIAGNOSIS — I69398 Other sequelae of cerebral infarction: Secondary | ICD-10-CM | POA: Diagnosis not present

## 2019-11-08 DIAGNOSIS — I63511 Cerebral infarction due to unspecified occlusion or stenosis of right middle cerebral artery: Secondary | ICD-10-CM | POA: Insufficient documentation

## 2019-11-08 DIAGNOSIS — I4891 Unspecified atrial fibrillation: Secondary | ICD-10-CM | POA: Insufficient documentation

## 2019-11-08 DIAGNOSIS — I10 Essential (primary) hypertension: Secondary | ICD-10-CM | POA: Diagnosis present

## 2019-11-08 DIAGNOSIS — R414 Neurologic neglect syndrome: Secondary | ICD-10-CM | POA: Diagnosis not present

## 2019-11-08 DIAGNOSIS — R269 Unspecified abnormalities of gait and mobility: Secondary | ICD-10-CM

## 2019-11-08 NOTE — Progress Notes (Signed)
Subjective:    Patient ID: Jose Castro, male    DOB: Oct 11, 1926, 85 y.o.   MRN: [id number]  85 year old male with history of hypertension, CKD 2, heart block status post pacemaker placement who was found down on 05/22/2019.  Was taken to the emergency department where further work-up indicated acute nonhemorrhagic right hemispheric infarct.  MRI not completed due to permanent pacemaker.  Patient had episode of atrial fibrillation during hospitalization and was seen by cardiology.  Changed to Eliquis on 625/2020.  Inpatient rehab from 05/26/2021 06/08/2019   HPI  85 year old male who follows up in physical medicine rehab clinic.  He continues get outpatient PT and OT.  His daughter is with him today.  She notes that he is still having left-sided neglect.  He still has the goal of return to driving as well as return to playing golf. He is making progress in PT in terms of lower extremity strength as well as balance.  He still requires a walker but no longer requires contact-guard assist. Patient is dressing and bathing himself.  He is not doing any housework other than trying some cooking.  He has not had any falls Pain Inventory Average Pain 0 Pain Right Now 0 My pain is  no pain  In the last 24 hours, has pain interfered with the following? General activity 0 Relation with others 0 Enjoyment of life 0 What TIME of day is your pain at its worst? no pain Sleep (in general) Fair  Pain is worse with: no pain Pain improves with: no pain Relief from Meds: no pain  Mobility walk without assistance ability to climb steps?  yes Do you have any goals in this area?  yes  Function retired  Neuro/Psych trouble walking  Prior Studies Any changes since last visit?  no  Physicians involved in your care Any changes since last visit?  no   Family History  Problem Relation Age of Onset  . Cancer Brother        bladder cancer with mets  . Heart disease Mother   . Aortic aneurysm Mother    . Heart attack Maternal Aunt   . Heart attack Maternal Uncle    Social History   Socioeconomic History  . Marital status: Widowed    Spouse name: Not on file  . Number of children: 6  . Years of education: Not on file  . Highest education level: Some college, no degree  Occupational History  . Occupation: retired    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  Social Needs  . Financial resource strain: Not hard at all  . Food insecurity    Worry: Never true    Inability: Never true  . Transportation needs    Medical: No    Non-medical: No  Tobacco Use  . Smoking status: Former Smoker    Years: 16.00    Types: Cigarettes    Start date: 10/21/1944    Quit date: 10/21/1960    Years since quitting: 59.0  . Smokeless tobacco: Never Used  Substance and Sexual Activity  . Alcohol use: Yes    Alcohol/week: 3.0 standard drinks    Types: 3 Standard drinks or equivalent per week    Comment: once a week when he goes out to dinner-socially  . Drug use: No  . Sexual activity: Yes    Partners: Female  Lifestyle  . Physical activity    Days per week: 0 days    Minutes  per session: 0 min  . Stress: Not at all  Relationships  . Social connections    Talks on phone: More than three times a week    Gets together: Twice a week    Attends religious service: More than 4 times per year    Active member of club or organization: Yes    Attends meetings of clubs or organizations: More than 4 times per year    Relationship status: Widowed  Other Topics Concern  . Not on file  Social History Narrative   Widow, has a good networks of friends and family    Past Surgical History:  Procedure Laterality Date  . APPENDECTOMY    . CATARACT EXTRACTION, BILATERAL    . CYSTOSCOPY WITH INSERTION OF UROLIFT    . KIDNEY STONE SURGERY    . KNEE SURGERY    . PACEMAKER INSERTION N/A 07/13/2018   Procedure: INSERTION PACEMAKER-DUEL CHAMBER INITIAL IMPLANT;  Surgeon: Isaias Cowman, MD;   Location: [facility] ORS;  Service: Cardiovascular;  Laterality: N/A;  . SPINE SURGERY     Past Medical History:  Diagnosis Date  . Arthritis   . BPH (benign prostatic hyperplasia)   . Chronic kidney disease    had a kidney stone which per family was a cyst that was removed  . Dysrhythmia   . GERD (gastroesophageal reflux disease)   . History of kidney stones   . Hyperlipidemia   . Hypertension   . Splenic vein thrombosis   . Stroke ([place]) 05/22/2019  . Thrombosis 12/2015   mural  area and no notation of heart attack   BP 108/71   Pulse 62   Temp 97.7 F (36.5 C)   Ht 5' 10.5" (1.791 m)   Wt 173 lb (78.5 kg)   SpO2 94%   BMI 24.47 kg/m   Opioid Risk Score:   Fall Risk Score:  `1  Depression screen PHQ 2/9  Depression screen [facility] 2/9 06/22/2019 02/01/2019 07/30/2018 06/21/2018 06/15/2018 09/09/2017 07/07/2017  Decreased Interest 0 0 0 0 0 0 0  Down, Depressed, Hopeless 0 0 0 0 0 0 0  PHQ - 2 Score 0 0 0 0 0 0 0  Altered sleeping 1 - 0 - - - -  Tired, decreased energy 0 - 0 - - - -  Change in appetite 0 - 0 - - - -  Feeling bad or failure about yourself  0 - 0 - - - -  Trouble concentrating 0 - 0 - - - -  Moving slowly or fidgety/restless 0 - 0 - - - -  Suicidal thoughts 0 - 0 - - - -  PHQ-9 Score 1 - - - - - -  Difficult doing work/chores Not difficult at all - Not difficult at all - - - -    Review of Systems  Constitutional: Negative.   HENT: Negative.   Eyes: Negative.   Respiratory: Negative.   Cardiovascular: Negative.   Gastrointestinal: Negative.   Endocrine: Negative.   Genitourinary: Negative.   Musculoskeletal: Positive for gait problem.  Skin: Negative.   Allergic/Immunologic: Negative.   Hematological: Negative.   Psychiatric/Behavioral: Negative.   All other systems reviewed and are negative.      Objective:   Physical Exam Vitals signs and nursing note reviewed.  Constitutional:      Appearance: Normal appearance.  HENT:     Head: Normocephalic and  atraumatic.  Eyes:     Extraocular Movements: Extraocular movements intact.  Conjunctiva/sclera: Conjunctivae normal.     Pupils: Pupils are equal, round, and reactive to light.  Skin:    General: Skin is warm and dry.  Neurological:     General: No focal deficit present.     Mental Status: He is alert and oriented to person, place, and time.     Comments: Motor strength is 5/5 bilateral deltoid bicep tricep grip hip flexion knee extension ankle dorsiflexion. Sensation intact bilaterally to light touch in the upper and lower limbs. Speech without dysarthria  Psychiatric:        Mood and Affect: Mood normal.        Behavior: Behavior normal.   Decreased rapid alternating supination pronation left upper extremity 20 degree contracture bilateral hamstring  Ambulates without assistive device but needing close supervision.  He is able to ambulate with a walker with more distant supervision no evidence of toe drag or knee instability Left neglect noted with turning her ambulation    Assessment & Plan:  #1.  Right MCA infarct some residual rapid alternating movement dysfunction as well as balance disorder.  In addition patient has some residual left neglect.  Discussed with the daughter would not approve driving unless this deficit improves.  He is approximately 6 months post CVA so it is unlikely that it will improve at this point.  We will continue PT OT scheduled through the end of January I will see him back early February. Instructed patient to continue with home exercise program as well as stretch the hamstring muscles bilaterally 1 to 2 minutes twice a day each side

## 2019-11-08 NOTE — Patient Instructions (Signed)
Please do hamstring stretching exercise, stretch for 1-2 min each leg twice a day  No driving until the left neglect clears up

## 2019-11-09 ENCOUNTER — Ambulatory Visit: Payer: Medicare Other

## 2019-11-10 ENCOUNTER — Encounter: Payer: Self-pay | Admitting: Occupational Therapy

## 2019-11-10 ENCOUNTER — Other Ambulatory Visit: Payer: Self-pay

## 2019-11-10 ENCOUNTER — Ambulatory Visit: Payer: Medicare Other

## 2019-11-10 ENCOUNTER — Ambulatory Visit: Payer: Medicare Other | Admitting: Occupational Therapy

## 2019-11-10 DIAGNOSIS — R2681 Unsteadiness on feet: Secondary | ICD-10-CM

## 2019-11-10 DIAGNOSIS — M6281 Muscle weakness (generalized): Secondary | ICD-10-CM

## 2019-11-10 DIAGNOSIS — R278 Other lack of coordination: Secondary | ICD-10-CM

## 2019-11-10 DIAGNOSIS — I63511 Cerebral infarction due to unspecified occlusion or stenosis of right middle cerebral artery: Secondary | ICD-10-CM

## 2019-11-10 NOTE — Patient Instructions (Signed)
Access Code: [email or web address]  URL: [email or web address]  Date: 11/10/2019  Prepared by: Roxana Hires   Exercises Seated Hamstring Stretch - 3 reps - 30s x 3 on each side hold - 2x daily - 7x weekly Sit to Stand without Arm Support - 10 reps - 2 sets - 2x daily - 7x weekly Seated March with Resistance - 10 reps - 2 sets - 3s hold - 2x daily - 7x weekly Seated Hip Abduction with Resistance - 10 reps - 2 sets - 3s hold - 2x daily - 7x weekly Seated Hip Adduction Squeeze with Ball - 10 reps - 2 sets - 3s hold - 2x daily - 7x weekly

## 2019-11-10 NOTE — Therapy (Signed)
[name] [facility] SERVICES [address] [place], [place], [address] Phone: [phone]   Fax:  [phone]  Occupational Therapy Treatment  Patient Details  Name: Jose Castro MRN: [id number] Date of Birth: 06/13/1926 Referring Provider (OT): Steele Sizer   Encounter Date: 11/10/2019  OT End of Session - 11/10/19 2030    Visit Number  11    Number of Visits  24    Date for OT Re-Evaluation  12/14/19    OT Start Time  1346    OT Stop Time  1430    OT Time Calculation (min)  44 min    Activity Tolerance  Patient tolerated treatment well    Behavior During Therapy  [facility] for tasks assessed/performed       Past Medical History:  Diagnosis Date  . Arthritis   . BPH (benign prostatic hyperplasia)   . Chronic kidney disease    had a kidney stone which per family was a cyst that was removed  . Dysrhythmia   . GERD (gastroesophageal reflux disease)   . History of kidney stones   . Hyperlipidemia   . Hypertension   . Splenic vein thrombosis   . Stroke ([place]) 05/22/2019  . Thrombosis 12/2015   mural  area and no notation of heart attack    Past Surgical History:  Procedure Laterality Date  . APPENDECTOMY    . CATARACT EXTRACTION, BILATERAL    . CYSTOSCOPY WITH INSERTION OF UROLIFT    . KIDNEY STONE SURGERY    . KNEE SURGERY    . PACEMAKER INSERTION N/A 07/13/2018   Procedure: INSERTION PACEMAKER-DUEL CHAMBER INITIAL IMPLANT;  Surgeon: Isaias Cowman, MD;  Location: [facility] ORS;  Service: Cardiovascular;  Laterality: N/A;  . SPINE SURGERY      There were no vitals filed for this visit.  Subjective Assessment - 11/10/19 2030    Subjective   No complaints this date, he reports he worked at home with his granddaughter on making chili, states he browned the ground beef, "she took pictures to show you what I did"    Pertinent History  Pt. is a 85 y.o. male who was admitted to [facility] with a [facility] DIstribution Infarct on  05/21/2019. Pt. went through Inpatient Rehab, and had home health OT, PT, and ST services. Pt. was previously residing home alone, and was independent with ADLs, and IADLs. Pt. now has family staying with the pt. around the clock.    Patient Stated Goals  to regain functional indpendence safely    Currently in Pain?  No/denies    Pain Score  0-No pain       Patient seen this date for UB strengthening task with UBE, forwards/backwards and alternating levels of resistance from 4.3 to 4.5 for 8 mins with therapist in constant attendance to adjust settings and ensure grip.   Therapeutic Activities:  Tasks directed to target patients Left neglect with use of card scatter pile, additional cards placed to extreme left on side table placed to the left of the patient, some cards on the side table and some on the table in front of patient.  He worked towards scanning to find all cards in order of suit and placed them together.  Patient instructed on card game which helps to promote attention to left side and challenge patient.  He was able to complete with min cues.  Instructed his daughter on task and how this would be beneficial for patient to play  at home with kids or grandkids.    Response to tx: Patient continues to progress, continues to demonstrate left neglect and benefits from any tasks to help work towards improved awareness, consistency with attending to left side.  Patient does best with a task which has an end result and can engage him with minimal distractions.  Continue to work towards goals to maximize safety and independence in necessary daily tasks.                    OT Education - 11/10/19 2030    Education Details  safety with functional mobility, left neglect    Person(s) Educated  Patient;Child(ren)    Methods  Explanation;Demonstration    Comprehension  Verbalized understanding;Returned demonstration;Verbal cues required          OT Long Term Goals - 11/03/19  2032      OT LONG TERM GOAL #1   Title  Pt. will demonstrate home management tasks with supervision initating safe strategies    Baseline  Eval: Decreased safety awareness    Time  12    Period  Weeks    Status  On-going    Target Date  12/14/19      OT LONG TERM GOAL #2   Title  Pt. will be able to identify 100% of potential safety hazards in a simulated home environment during ADLs, and IADLs.    Baseline  Eval: Limited safety awareness    Time  12    Period  Weeks    Status  Partially Met    Target Date  12/14/19      OT LONG TERM GOAL #3   Title  Pt. will increase left grip strength by 5# of force to assist with holding items duirng ADLs, and IADLs.    Baseline  Eval: decreased left grip strength    Time  12    Period  Weeks    Status  On-going    Target Date  12/14/19      OT LONG TERM GOAL #4   Title  Pt. will improve left hand [facility] skills by 3 sec of speed to be able to manipulate objects during ADLs, and IADL tasks.    Baseline  Eval: Pt. has difficulty    Time  12    Period  Weeks    Status  On-going    Target Date  12/14/19      OT LONG TERM GOAL #5   Title  Pt. will demonstrate visual compensatory strategies as needed during ADLs, and IADL tasks.    Baseline  Eval: Limited left sided awareness    Time  12    Period  Weeks    Status  On-going    Target Date  12/14/19      OT LONG TERM GOAL #6   Title  Pt. will demonstrate cognitive compensatory strategies during ADLs, and IADLs    Baseline  Eval: Limited cognition/problem solving, impulsive    Time  12    Period  Weeks    Status  On-going    Target Date  12/14/19            Plan - 11/10/19 2031    Clinical Impression Statement  Patient continues to progress, continues to demonstrate left neglect and benefits from any tasks to help work towards improved awareness, consistency with attending to left side.  Patient does best with a task which has an end result and can engage him  with minimal  distractions.  Continue to work towards goals to maximize safety and independence in necessary daily tasks.    OT Occupational Profile and History  Detailed Assessment- Review of Records and additional review of physical, cognitive, psychosocial history related to current functional performance    Occupational performance deficits (Please refer to evaluation for details):  ADL's;IADL's    Body Structure / Function / Physical Skills  ADL;FMC;ROM;IADL;Strength;Decreased knowledge of use of DME;UE functional use    Rehab Potential  Excellent    Clinical Decision Making  Several treatment options, min-mod task modification necessary    Comorbidities Affecting Occupational Performance:  May have comorbidities impacting occupational performance    Modification or Assistance to Complete Evaluation   Min-Moderate modification of tasks or assist with assess necessary to complete eval    OT Frequency  2x / week    OT Duration  12 weeks    OT Treatment/Interventions  Self-care/ADL training;DME and/or AE instruction;Therapeutic exercise;Neuromuscular education;Patient/family education;Therapeutic activities    Consulted and Agree with Plan of Care  Patient       Patient will benefit from skilled therapeutic intervention in order to improve the following deficits and impairments:   Body Structure / Function / Physical Skills: ADL, FMC, ROM, IADL, Strength, Decreased knowledge of use of DME, UE functional use       Visit Diagnosis: Muscle weakness (generalized)  Unsteadiness on feet  Other lack of coordination  Right middle cerebral artery stroke [facility])    Problem List Patient Active Problem List   Diagnosis Date Noted  . 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 Great toe pain, right   . Lethargy   . CKD (chronic kidney disease), stage II   . Leukocytosis   . Benign essential HTN   . New onset atrial fibrillation ([place])   . Dysphagia, post-stroke   . Right middle cerebral artery  stroke ([place]) 05/27/2019  . Acute ischemic right MCA stroke ([place]) 05/25/2019  . Goals of care, counseling/discussion   . Palliative care by specialist   . DNR (do not resuscitate) discussion   . Stroke, acute, embolic ([place]) 32/35/5732  . Persistent proteinuria 07/30/2018  . 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 Chronic kidney disease, stage III (moderate) 06/15/2018  . Leg pain 05/06/2018  . Lymphedema 05/06/2018  . Coagulopathy ([place]) 07/07/2017  . Lumbar spondylosis 03/10/2017  . Elevated uric acid in blood 11/20/2016  . BPH (benign prostatic hyperplasia) 05/29/2016  . Peripheral vascular disease of lower extremity ([place]) 03/03/2016  . Gallstone 12/31/2015  . 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 Atherosclerosis of aorta ([place]) 11/22/2015  . Carotid artery narrowing 11/22/2015  . Diverticulosis of colon 11/22/2015  . Decreased creatinine clearance 11/22/2015  . Arthritis, degenerative 11/22/2015  . Lactose intolerance 11/22/2015  . Basal cell carcinoma 11/22/2015  . Essential hypertension 06/21/2015  . Hyperlipemia 06/21/2015  . GERD (gastroesophageal reflux disease) 06/21/2015  . Calculus of kidney 11/18/2013   Achilles Dunk, OTR/L, CLT  Somer Trotter 11/11/2019, 1:01 PM  [place] MAIN [facility] SERVICES [address] [place], [place], [address] Phone: [phone]   Fax:  [phone]  Name: KIRKLIN MCDUFFEE MRN: [id number] Date of Birth: 01-22-1926

## 2019-11-10 NOTE — Therapy (Signed)
[place] MAIN [facility] SERVICES [address] [place], [place], [address] Phone: [phone]   Fax:  [phone]  Physical Therapy Treatment  Patient Details  Name: Jose Castro MRN: [id number] Date of Birth: 04-Mar-1926 Referring Provider (PT): Dr. Letta Pate   Encounter Date: 11/10/2019  PT End of Session - 11/10/19 1310    Visit Number  11    Number of Visits  25    Date for PT Re-Evaluation  12/14/19    PT Start Time  1304    PT Stop Time  1345    PT Time Calculation (min)  41 min    Equipment Utilized During Treatment  Gait belt    Activity Tolerance  Patient tolerated treatment well    Behavior During Therapy  [facility] for tasks assessed/performed       Past Medical History:  Diagnosis Date  . Arthritis   . BPH (benign prostatic hyperplasia)   . Chronic kidney disease    had a kidney stone which per family was a cyst that was removed  . Dysrhythmia   . GERD (gastroesophageal reflux disease)   . History of kidney stones   . Hyperlipidemia   . Hypertension   . Splenic vein thrombosis   . Stroke ([place]) 05/22/2019  . Thrombosis 12/2015   mural  area and no notation of heart attack    Past Surgical History:  Procedure Laterality Date  . APPENDECTOMY    . CATARACT EXTRACTION, BILATERAL    . CYSTOSCOPY WITH INSERTION OF UROLIFT    . KIDNEY STONE SURGERY    . KNEE SURGERY    . PACEMAKER INSERTION N/A 07/13/2018   Procedure: INSERTION PACEMAKER-DUEL CHAMBER INITIAL IMPLANT;  Surgeon: Isaias Cowman, MD;  Location: [facility] ORS;  Service: Cardiovascular;  Laterality: N/A;  . SPINE SURGERY      There were no vitals filed for this visit.  Subjective Assessment - 11/10/19 1310    Subjective  Pt reports he is doing well today. He has no new complaints. He states he has not fallen since last visit. No specific questions or concerns.    Pertinent History  Pt is a 85 year old male who presents with imbalance and difficulty with gait  following a right frontal CVA with left hemiparesis on 05/22/19.  He has completed inpatient and home health PT, and now presents for OP PT.  He was discharged from inpatient rehab at a supervision level of assistance, ambulating with a RW.  His family notes that he is impulsive and will try to ambulate without his RW at home.  Family is currently providing 24/7 supervision.    How long can you sit comfortably?  no limitations    How long can you walk comfortably?  he requires a RW at home    Patient Stated Goals  get back to normal; want to be able to play golf and go out to play trivia again.  His daughters want him to regain independence with going to the bathroom at night    Currently in Pain?  No/denies         TREATMENT   Therapeutic Exercise: xRide L3-4 x 5 minutes for warm-up during history, resistance adjusted to match fatigue and intensity; Quantum leg press 90# RLE 2 x 20, 75# LLE x 20, attempted second set but discontinued due to increase in pain; HS stretch in sitting x 30s each (added to HEP); STSs from standard chair without UE support(occasional hand support on knees) and Airex  pad under feet 2 x 10 CGA throughout; Seated marches x 10 bilateral HEP provided and reviewed with patient;   Neuromuscular Reeducation: 6" step taps alternating LE x 10 on each side without UE support; 6" step ups alternating LE with single UE support x 5 leading with each side; Airex balance beam tandem gait with 2 finger support x 4 lengths; Airex balance beam side stepping with 2 finger support x 4 lengths;   Pt educated throughout session about proper posture and technique with exercises. Improved exercise technique, movement at target joints, use of target muscles after min to mod verbal, visual, tactile cues.   Pt demonstrated good motivation throughout today's session. He is able to continue with his strengthening exercises. Utilized leg press today but LLE limited secondary  to knee pain. He continues to be challenged by unstable surfaces such as the Airex pad/balane beam. Pt provided HEP today including hamstring stretches as instructed by Dr. Letta Pate. He will benefit from skilled PT services to address deficits in balance and decrease risk for future falls.                      PT Short Term Goals - 11/03/19 1609      PT SHORT TERM GOAL #1   Title  Pt will be independent with HEP in order to improve strength and balance in order to decrease fall risk and improve function at home and work.    Time  6    Period  Weeks    Status  On-going    Target Date  11/02/19        PT Long Term Goals - 11/03/19 1609      PT LONG TERM GOAL #1   Title  Pt will improve BERG to >50 in order to decrease his fall risk.    Baseline  09/21/19: 42/56, 11/03/19: 47/56    Time  12    Period  Weeks    Status  Revised    Target Date  12/14/19      PT LONG TERM GOAL #2   Title  Pt will decrease TUG to below 14 seconds/decrease in order to demonstrate decreased fall risk.    Baseline  09/21/19: 22.19s; 11/03/19: 19.0s    Time  12    Period  Weeks    Status  Partially Met    Target Date  12/14/19      PT LONG TERM GOAL #3   Title  Pt will decrease 5TSTS by at least 3 seconds in order to demonstrate clinically significant improvement in LE strength.    Baseline  09/21/19: 32.28s; 11/03/19: 15.5s    Time  12    Period  Weeks    Status  Achieved      PT LONG TERM GOAL #4   Title  Pt will increase his 10MWT by at least 0.13 m/s in order to demonstrate a clinically significant improvement in gait speed.    Baseline  09/21/19: Self-selected: 20.9 s = 0.48 m/s; Fastest: 17.1s =0.58 m/s; 11/03/19: Self-selected: 17.7s s = 0.56 m/s; Fastest: 13.5s =0.74 m/s with walker    Time  12    Period  Weeks    Status  Partially Met    Target Date  12/14/19            Plan - 11/10/19 1311    Clinical Impression Statement  Pt demonstrated good motivation  throughout today's session. He is able to continue with his  strengthening exercises. Utilized leg press today but LLE limited secondary to knee pain. He continues to be challenged by unstable surfaces such as the Airex pad/balane beam. Pt provided HEP today including hamstring stretches as instructed by Dr. Letta Pate. He will benefit from skilled PT services to address deficits in balance and decrease risk for future falls.    Personal Factors and Comorbidities  Age;Comorbidity 2    Examination-Activity Limitations  Bend;Lift;Squat;Locomotion Level;Stairs;Stand    Examination-Participation Restrictions  Community Activity;Driving    Stability/Clinical Decision Making  Evolving/Moderate complexity    Rehab Potential  Good    PT Frequency  2x / week    PT Duration  12 weeks    PT Treatment/Interventions  ADLs/Self Care Home Management;Canalith Repostioning;Cryotherapy;Electrical Stimulation;Iontophoresis 59m/ml Dexamethasone;Moist Heat;Traction;Ultrasound;DME Instruction;Gait training;Stair training;Therapeutic activities;Therapeutic exercise;Functional mobility training;Balance training;Neuromuscular re-education;Patient/family education;Manual techniques;Dry needling;Vestibular;Joint Manipulations;Spinal Manipulations    PT Next Visit Plan  progress strength and balance    PT Home Exercise Plan  Access Code: [email or web address]   Consulted and Agree with Plan of Care  Patient;Family member/caregiver    Family Member Consulted  Patient's granddaughter       Patient will benefit from skilled therapeutic intervention in order to improve the following deficits and impairments:  Abnormal gait, Decreased balance, Decreased endurance, Decreased mobility, Difficulty walking, Decreased knowledge of precautions, Decreased safety awareness, Decreased activity tolerance, Decreased strength, Hypermobility  Visit Diagnosis: Muscle weakness (generalized)  Unsteadiness on feet     Problem List Patient Active  Problem List   Diagnosis Date Noted  . 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 Great toe pain, right   . Lethargy   . CKD (chronic kidney disease), stage II   . Leukocytosis   . Benign essential HTN   . New onset atrial fibrillation (H[place]   . Dysphagia, post-stroke   . Right middle cerebral artery stroke ([place] 05/27/2019  . Acute ischemic right MCA stroke ([place] 05/25/2019  . Goals of care, counseling/discussion   . Palliative care by specialist   . DNR (do not resuscitate) discussion   . Stroke, acute, embolic ([place] 091/79/1505 . Persistent proteinuria 07/30/2018  . 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 Chronic kidney disease, stage III (moderate) 06/15/2018  . Leg pain 05/06/2018  . Lymphedema 05/06/2018  . Coagulopathy ([place] 07/07/2017  . Lumbar spondylosis 03/10/2017  . Elevated uric acid in blood 11/20/2016  . BPH (benign prostatic hyperplasia) 05/29/2016  . Peripheral vascular disease of lower extremity ([place] 03/03/2016  . Gallstone 12/31/2015  . 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 Atherosclerosis of aorta ([place] 11/22/2015  . Carotid artery narrowing 11/22/2015  . Diverticulosis of colon 11/22/2015  . Decreased creatinine clearance 11/22/2015  . Arthritis, degenerative 11/22/2015  . Lactose intolerance 11/22/2015  . Basal cell carcinoma 11/22/2015  . Essential hypertension 06/21/2015  . Hyperlipemia 06/21/2015  . GERD (gastroesophageal reflux disease) 06/21/2015  . Calculus of kidney 11/18/2013   JPhillips GroutPT, DPT, GCS  Lashaya Kienitz 11/10/2019, 3:07 PM  CMar[place]halMAIN [facility]au[facility] HospitalSERVICES [address] [address]Phone: [phone]  Fax:  [phone] Name: Jose DISCHERMRN: [id number] of Birth: 9Aug 05, 1927

## 2019-11-14 ENCOUNTER — Ambulatory Visit: Payer: Medicare Other

## 2019-11-15 ENCOUNTER — Ambulatory Visit: Payer: Medicare Other

## 2019-11-15 ENCOUNTER — Ambulatory Visit: Payer: Medicare Other | Admitting: Occupational Therapy

## 2019-11-15 ENCOUNTER — Other Ambulatory Visit: Payer: Self-pay

## 2019-11-15 ENCOUNTER — Encounter: Payer: Self-pay | Admitting: Occupational Therapy

## 2019-11-15 DIAGNOSIS — R278 Other lack of coordination: Secondary | ICD-10-CM

## 2019-11-15 DIAGNOSIS — M6281 Muscle weakness (generalized): Secondary | ICD-10-CM | POA: Diagnosis not present

## 2019-11-15 DIAGNOSIS — R2681 Unsteadiness on feet: Secondary | ICD-10-CM

## 2019-11-15 NOTE — Therapy (Signed)
[place] MAIN [facility] SERVICES [address] [place], [place], [address] Phone: [phone]   Fax:  [phone]  Physical Therapy Treatment  Patient Details  Name: Jose Castro MRN: [id number] Date of Birth: 11-Jan-1926 Referring Provider (PT): Dr. Letta Pate   Encounter Date: 11/15/2019  PT End of Session - 11/15/19 1109    Visit Number  12    Number of Visits  25    Date for PT Re-Evaluation  12/14/19    PT Start Time  1105    PT Stop Time  1145    PT Time Calculation (min)  40 min    Equipment Utilized During Treatment  Gait belt    Activity Tolerance  Patient tolerated treatment well    Behavior During Therapy  [facility] for tasks assessed/performed       Past Medical History:  Diagnosis Date  . Arthritis   . BPH (benign prostatic hyperplasia)   . Chronic kidney disease    had a kidney stone which per family was a cyst that was removed  . Dysrhythmia   . GERD (gastroesophageal reflux disease)   . History of kidney stones   . Hyperlipidemia   . Hypertension   . Splenic vein thrombosis   . Stroke ([place]) 05/22/2019  . Thrombosis 12/2015   mural  area and no notation of heart attack    Past Surgical History:  Procedure Laterality Date  . APPENDECTOMY    . CATARACT EXTRACTION, BILATERAL    . CYSTOSCOPY WITH INSERTION OF UROLIFT    . KIDNEY STONE SURGERY    . KNEE SURGERY    . PACEMAKER INSERTION N/A 07/13/2018   Procedure: INSERTION PACEMAKER-DUEL CHAMBER INITIAL IMPLANT;  Surgeon: Isaias Cowman, MD;  Location: [facility] ORS;  Service: Cardiovascular;  Laterality: N/A;  . SPINE SURGERY      There were no vitals filed for this visit.  Subjective Assessment - 11/15/19 1109    Subjective  Pt reports he is doing well today. He has no new complaints. He states he has not fallen since last visit. No changes in health or medications since last visit. No specific questions or concerns.    Pertinent History  Pt is a 85 year old male who  presents with imbalance and difficulty with gait following a right frontal CVA with left hemiparesis on 05/22/19.  He has completed inpatient and home health PT, and now presents for OP PT.  He was discharged from inpatient rehab at a supervision level of assistance, ambulating with a RW.  His family notes that he is impulsive and will try to ambulate without his RW at home.  Family is currently providing 24/7 supervision.    How long can you sit comfortably?  no limitations    How long can you walk comfortably?  he requires a RW at home    Patient Stated Goals  get back to normal; want to be able to play golf and go out to play trivia again.  His daughters want him to regain independence with going to the bathroom at night    Currently in Pain?  No/denies           TREATMENT   Therapeutic Exercise: xRide L3-4 x 5 minutes for warm-up during history, resistance adjusted to match fatigue and intensity; Seated marching with 2.5# ankle weights (AW) x 15 bilateral; Seated LAQ 3s hold with 2.5# AW x 15 bilateral; Seated clams with red tband 3s hold x 15; Seated adductor ball squeeze 3s  hold x 15; HS step stretch in sitting 2 x 30s each; STSs from standard chair without UE support(occasional hand support on knees but less than with Airex pad) x 10CGA throughout, pt encouraged to perform for speed;   Neuromuscular Reeducation: 6" step taps alternating LE x 10 on each side without UE support; 6" step ups alternating LE with faded UE support x 10 on each side, pt able to perform a few without UE support but struggles with SLS on LLE; Airex balance in NBOS without UE support x 30s; Airex balance in NBOS without UE support with horizontal and vertical head turns x 30s each;   Pt educated throughout session about proper posture and technique with exercises. Improved exercise technique, movement at target joints, use of target muscles after min to mod verbal, visual, tactile  cues.   Pt demonstrated good motivation throughout today's session.He is able to continue with his strengthening exercises. He tolerates seated LAQ and step-ups without increase in L knee pain. He continues to struggle with balance with head turns on unstable surfaces. Hewill benefit from skilled PT services to address deficits in balance and decrease risk for future falls.                      PT Short Term Goals - 11/03/19 1609      PT SHORT TERM GOAL #1   Title  Pt will be independent with HEP in order to improve strength and balance in order to decrease fall risk and improve function at home and work.    Time  6    Period  Weeks    Status  On-going    Target Date  11/02/19        PT Long Term Goals - 11/03/19 1609      PT LONG TERM GOAL #1   Title  Pt will improve BERG to >50 in order to decrease his fall risk.    Baseline  09/21/19: 42/56, 11/03/19: 47/56    Time  12    Period  Weeks    Status  Revised    Target Date  12/14/19      PT LONG TERM GOAL #2   Title  Pt will decrease TUG to below 14 seconds/decrease in order to demonstrate decreased fall risk.    Baseline  09/21/19: 22.19s; 11/03/19: 19.0s    Time  12    Period  Weeks    Status  Partially Met    Target Date  12/14/19      PT LONG TERM GOAL #3   Title  Pt will decrease 5TSTS by at least 3 seconds in order to demonstrate clinically significant improvement in LE strength.    Baseline  09/21/19: 32.28s; 11/03/19: 15.5s    Time  12    Period  Weeks    Status  Achieved      PT LONG TERM GOAL #4   Title  Pt will increase his 10MWT by at least 0.13 m/s in order to demonstrate a clinically significant improvement in gait speed.    Baseline  09/21/19: Self-selected: 20.9 s = 0.48 m/s; Fastest: 17.1s =0.58 m/s; 11/03/19: Self-selected: 17.7s s = 0.56 m/s; Fastest: 13.5s =0.74 m/s with walker    Time  12    Period  Weeks    Status  Partially Met    Target Date  12/14/19             Plan - 11/15/19 1110  Clinical Impression Statement  Pt demonstrated good motivation throughout today's session. He is able to continue with his strengthening exercises. He tolerates seated LAQ and step-ups without increase in L knee pain. He continues to struggle with balance with head turns on unstable surfaces. He will benefit from skilled PT services to address deficits in balance and decrease risk for future falls.    Personal Factors and Comorbidities  Age;Comorbidity 2    Examination-Activity Limitations  Bend;Lift;Squat;Locomotion Level;Stairs;Stand    Examination-Participation Restrictions  Community Activity;Driving    Stability/Clinical Decision Making  Evolving/Moderate complexity    Rehab Potential  Good    PT Frequency  2x / week    PT Duration  12 weeks    PT Treatment/Interventions  ADLs/Self Care Home Management;Canalith Repostioning;Cryotherapy;Electrical Stimulation;Iontophoresis 62m/ml Dexamethasone;Moist Heat;Traction;Ultrasound;DME Instruction;Gait training;Stair training;Therapeutic activities;Therapeutic exercise;Functional mobility training;Balance training;Neuromuscular re-education;Patient/family education;Manual techniques;Dry needling;Vestibular;Joint Manipulations;Spinal Manipulations    PT Next Visit Plan  progress strength and balance    PT Home Exercise Plan  Access Code: [email or web address]   Consulted and Agree with Plan of Care  Patient;Family member/caregiver    Family Member Consulted  Patient's granddaughter       Patient will benefit from skilled therapeutic intervention in order to improve the following deficits and impairments:  Abnormal gait, Decreased balance, Decreased endurance, Decreased mobility, Difficulty walking, Decreased knowledge of precautions, Decreased safety awareness, Decreased activity tolerance, Decreased strength, Hypermobility  Visit Diagnosis: Muscle weakness (generalized)  Unsteadiness on feet     Problem  List Patient Active Problem List   Diagnosis Date Noted  . 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 Great toe pain, right   . Lethargy   . CKD (chronic kidney disease), stage II   . Leukocytosis   . Benign essential HTN   . New onset atrial fibrillation (HItasc[place]   . Dysphagia, post-stroke   . Right middle cerebral artery stroke (HP[place] 05/27/2019  . Acute ischemic right MCA stroke (HTenn[place] 05/25/2019  . Goals of care, counseling/discussion   . Palliative care by specialist   . DNR (do not resuscitate) discussion   . Stroke, acute, embolic (HRivers[place] 063/84/6659 . Persistent proteinuria 07/30/2018  . 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 Chronic kidney disease, stage III (moderate) 06/15/2018  . Leg pain 05/06/2018  . Lymphedema 05/06/2018  . Coagulopathy ([place] 07/07/2017  . Lumbar spondylosis 03/10/2017  . Elevated uric acid in blood 11/20/2016  . BPH (benign prostatic hyperplasia) 05/29/2016  . Peripheral vascular disease of lower extremity (HVill[place] 03/03/2016  . Gallstone 12/31/2015  . 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 Atherosclerosis of aorta (HO[place] 11/22/2015  . Carotid artery narrowing 11/22/2015  . Diverticulosis of colon 11/22/2015  . Decreased creatinine clearance 11/22/2015  . Arthritis, degenerative 11/22/2015  . Lactose intolerance 11/22/2015  . Basal cell carcinoma 11/22/2015  . Essential hypertension 06/21/2015  . Hyperlipemia 06/21/2015  . GERD (gastroesophageal reflux disease) 06/21/2015  . Calculus of kidney 11/18/2013   JPhillips GroutPT, DPT, GCS  Seanmichael Salmons 11/15/2019, 12:21 PM  CGrapeviewMAIN RCenter [facility] CenterSERVICES [address] [address]Phone: [phone]  Fax:  [phone] Name: DGAINES CARTMELLMRN: [id number] of Birth: [birth year]

## 2019-11-15 NOTE — Therapy (Signed)
[place] MAIN [facility] SERVICES [address] [place], [place], [address] Phone: [phone]   Fax:  [phone]  Occupational Therapy Treatment  Patient Details  Name: Jose Castro MRN: [id number] Date of Birth: 10-May-1926 Referring Provider (OT): Steele Sizer   Encounter Date: 11/15/2019  OT End of Session - 11/15/19 1202    Visit Number  12    Number of Visits  24    Date for OT Re-Evaluation  12/14/19    OT Start Time  1148    OT Stop Time  1230    OT Time Calculation (min)  42 min    Activity Tolerance  Patient tolerated treatment well    Behavior During Therapy  [facility] for tasks assessed/performed       Past Medical History:  Diagnosis Date  . Arthritis   . BPH (benign prostatic hyperplasia)   . Chronic kidney disease    had a kidney stone which per family was a cyst that was removed  . Dysrhythmia   . GERD (gastroesophageal reflux disease)   . History of kidney stones   . Hyperlipidemia   . Hypertension   . Splenic vein thrombosis   . Stroke ([place]) 05/22/2019  . Thrombosis 12/2015   mural  area and no notation of heart attack    Past Surgical History:  Procedure Laterality Date  . APPENDECTOMY    . CATARACT EXTRACTION, BILATERAL    . CYSTOSCOPY WITH INSERTION OF UROLIFT    . KIDNEY STONE SURGERY    . KNEE SURGERY    . PACEMAKER INSERTION N/A 07/13/2018   Procedure: INSERTION PACEMAKER-DUEL CHAMBER INITIAL IMPLANT;  Surgeon: Isaias Cowman, MD;  Location: [facility] ORS;  Service: Cardiovascular;  Laterality: N/A;  . SPINE SURGERY      There were no vitals filed for this visit.  Subjective Assessment - 11/15/19 1158    Subjective   Pt. reports making a big batch of chili this weekend.    Pertinent History  Pt. is a 85 y.o. male who was admitted to [facility] with a [facility]stribution Infarct on 05/21/2019. Pt. went through Inpatient Rehab, and had home health OT, PT, and ST services. Pt. was previously residing  home alone, and was independent with ADLs, and IADLs. Pt. now has family staying with the pt. around the clock.    Patient Stated Goals  to regain functional indpendence safely    Currently in Pain?  No/denies      OT TREATMENT    Therapeutic Activities:  Pt. worked on scanning to the left, and the far left to match pairs of cards sorted by color, and in groups adding to 15. Pt. required cues initially for directions. Pt. was able to complete it with 100% accuracy.   Therapeutic Exercise:  Pt. worked on the [employer] for 8 min. with constant monitoring of the BUEs. Pt. worked on changing, and alternating forward reverse position every 2 min. P.t worked on level 4.5. Pt. performed gross gripping with grip strengthener. Pt. worked on sustaining grip while grasping pegs and reaching at various plane to the left. The gripper was set at 23.4# of grip strength resistive force. Pt. worked on using the DIgiFlex 1.5#. pt. required cues for visual demonstration.  Response to Treatment:  Pt. is making steady progress, and has tolerated UE ther. ex on the UBE well. Pt. requires verbal cues, and cues for visual demonstration for handle placement. Pt. was able scan to the far left with  100% accuracy to match cards, and calculating cards of 15. Pt. required cues for directions initially at the beginning of the task. Pt. continues to work on improving UE strength, and [facility] skills in order to improve perfromance, and safety with ADLs, and IADL functioning.                          OT Education - 11/15/19 1201    Education Details  left neglect, UE strength    Person(s) Educated  Patient;Child(ren)    Methods  Explanation;Demonstration    Comprehension  Verbalized understanding;Returned demonstration;Verbal cues required          OT Long Term Goals - 11/03/19 2032      OT LONG TERM GOAL #1   Title  Pt. will demonstrate home management tasks with supervision initating safe strategies     Baseline  Eval: Decreased safety awareness    Time  12    Period  Weeks    Status  On-going    Target Date  12/14/19      OT LONG TERM GOAL #2   Title  Pt. will be able to identify 100% of potential safety hazards in a simulated home environment during ADLs, and IADLs.    Baseline  Eval: Limited safety awareness    Time  12    Period  Weeks    Status  Partially Met    Target Date  12/14/19      OT LONG TERM GOAL #3   Title  Pt. will increase left grip strength by 5# of force to assist with holding items duirng ADLs, and IADLs.    Baseline  Eval: decreased left grip strength    Time  12    Period  Weeks    Status  On-going    Target Date  12/14/19      OT LONG TERM GOAL #4   Title  Pt. will improve left hand [facility] skills by 3 sec of speed to be able to manipulate objects during ADLs, and IADL tasks.    Baseline  Eval: Pt. has difficulty    Time  12    Period  Weeks    Status  On-going    Target Date  12/14/19      OT LONG TERM GOAL #5   Title  Pt. will demonstrate visual compensatory strategies as needed during ADLs, and IADL tasks.    Baseline  Eval: Limited left sided awareness    Time  12    Period  Weeks    Status  On-going    Target Date  12/14/19      OT LONG TERM GOAL #6   Title  Pt. will demonstrate cognitive compensatory strategies during ADLs, and IADLs    Baseline  Eval: Limited cognition/problem solving, impulsive    Time  12    Period  Weeks    Status  On-going    Target Date  12/14/19            Plan - 11/15/19 1204    Clinical Impression Statement Pt. is making steady progress, and has tolerated UE ther. ex on the UBE well. Pt. requires verbal cues, and cues for visual demonstration for handle placement. Pt. was able scan to the far left with 100% accuracy to match cards, and calculating cards of 15. Pt. required cues for directions initially at the beginning of the task. Pt. continues to work on improving UE strength,  and [facility] skills in order to  improve perfromance, and safety with ADLs, and IADL functioning.    OT Occupational Profile and History  Detailed Assessment- Review of Records and additional review of physical, cognitive, psychosocial history related to current functional performance    Occupational performance deficits (Please refer to evaluation for details):  ADL's;IADL's    Body Structure / Function / Physical Skills  ADL;FMC;ROM;IADL;Strength;Decreased knowledge of use of DME;UE functional use    Rehab Potential  Excellent    Clinical Decision Making  Several treatment options, min-mod task modification necessary    Comorbidities Affecting Occupational Performance:  May have comorbidities impacting occupational performance    Modification or Assistance to Complete Evaluation   Min-Moderate modification of tasks or assist with assess necessary to complete eval    OT Frequency  2x / week    OT Duration  12 weeks    OT Treatment/Interventions  Self-care/ADL training;DME and/or AE instruction;Therapeutic exercise;Neuromuscular education;Patient/family education;Therapeutic activities    Consulted and Agree with Plan of Care  Patient       Patient will benefit from skilled therapeutic intervention in order to improve the following deficits and impairments:   Body Structure / Function / Physical Skills: ADL, FMC, ROM, IADL, Strength, Decreased knowledge of use of DME, UE functional use       Visit Diagnosis: Muscle weakness (generalized)  Other lack of coordination    Problem List Patient Active Problem List   Diagnosis Date Noted  . 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 Great toe pain, right   . Lethargy   . CKD (chronic kidney disease), stage II   . Leukocytosis   . Benign essential HTN   . New onset atrial fibrillation ([place])   . Dysphagia, post-stroke   . 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 Acute ischemic right MCA stroke ([place]) 05/25/2019  . Goals of care,  counseling/discussion   . Palliative care by specialist   . DNR (do not resuscitate) discussion   . Stroke, acute, embolic ([place]) 53/66/4403  . Persistent proteinuria 07/30/2018  . 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 Chronic kidney disease, stage III (moderate) 06/15/2018  . Leg pain 05/06/2018  . Lymphedema 05/06/2018  . Coagulopathy ([place]) 07/07/2017  . Lumbar spondylosis 03/10/2017  . Elevated uric acid in blood 11/20/2016  . BPH (benign prostatic hyperplasia) 05/29/2016  . Peripheral vascular disease of lower extremity ([place]) 03/03/2016  . Gallstone 12/31/2015  . 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 Atherosclerosis of aorta ([place]) 11/22/2015  . Carotid artery narrowing 11/22/2015  . Diverticulosis of colon 11/22/2015  . Decreased creatinine clearance 11/22/2015  . Arthritis, degenerative 11/22/2015  . Lactose intolerance 11/22/2015  . Basal cell carcinoma 11/22/2015  . Essential hypertension 06/21/2015  . Hyperlipemia 06/21/2015  . GERD (gastroesophageal reflux disease) 06/21/2015  . Calculus of kidney 11/18/2013    Harrel Carina, MS, OTR/L 11/15/2019, 2:56 PM  [place] MAIN [facility] SERVICES [address] [place], [place], [address] Phone: [phone]   Fax:  [phone]  Name: Jose Castro MRN: [id number] Date of Birth: 01/13/1926

## 2019-11-16 ENCOUNTER — Ambulatory Visit: Payer: Medicare Other

## 2019-11-17 ENCOUNTER — Ambulatory Visit: Payer: Medicare Other

## 2019-11-17 ENCOUNTER — Ambulatory Visit: Payer: Medicare Other | Admitting: Occupational Therapy

## 2019-11-17 ENCOUNTER — Encounter: Payer: Self-pay | Admitting: Occupational Therapy

## 2019-11-17 ENCOUNTER — Other Ambulatory Visit: Payer: Self-pay

## 2019-11-17 DIAGNOSIS — M6281 Muscle weakness (generalized): Secondary | ICD-10-CM

## 2019-11-17 DIAGNOSIS — R2681 Unsteadiness on feet: Secondary | ICD-10-CM

## 2019-11-17 DIAGNOSIS — R278 Other lack of coordination: Secondary | ICD-10-CM

## 2019-11-17 NOTE — Therapy (Signed)
[place] MAIN [facility] SERVICES [address] [place], [place], [address] Phone: [phone]   Fax:  [phone]  Physical Therapy Treatment  Patient Details  Name: Jose Castro MRN: [id number] Date of Birth: [birth year] Referring Provider (PT): Dr. Letta Pate   Encounter Date: 11/17/2019  PT End of Session - 11/17/19 1258    Visit Number  13    Number of Visits  25    Date for PT Re-Evaluation  12/14/19    PT Start Time  1300    PT Stop Time  1345    PT Time Calculation (min)  45 min    Equipment Utilized During Treatment  Gait belt    Activity Tolerance  Patient tolerated treatment well    Behavior During Therapy  [facility] for tasks assessed/performed       Past Medical History:  Diagnosis Date  . Arthritis   . BPH (benign prostatic hyperplasia)   . Chronic kidney disease    had a kidney stone which per family was a cyst that was removed  . Dysrhythmia   . GERD (gastroesophageal reflux disease)   . History of kidney stones   . Hyperlipidemia   . Hypertension   . Splenic vein thrombosis   . Stroke ([place]) 05/22/2019  . Thrombosis 12/2015   mural  area and no notation of heart attack    Past Surgical History:  Procedure Laterality Date  . APPENDECTOMY    . CATARACT EXTRACTION, BILATERAL    . CYSTOSCOPY WITH INSERTION OF UROLIFT    . KIDNEY STONE SURGERY    . KNEE SURGERY    . PACEMAKER INSERTION N/A 07/13/2018   Procedure: INSERTION PACEMAKER-DUEL CHAMBER INITIAL IMPLANT;  Surgeon: Isaias Cowman, MD;  Location: [facility] ORS;  Service: Cardiovascular;  Laterality: N/A;  . SPINE SURGERY      There were no vitals filed for this visit.  Subjective Assessment - 11/17/19 1257    Subjective  Pt reports he is doing well today. He has no new complaints. He states he has not fallen since last visit. No changes in health or medications since last visit. No specific questions or concerns.    Pertinent History  Pt is a 85 year old male who  presents with imbalance and difficulty with gait following a right frontal CVA with left hemiparesis on 05/22/19.  He has completed inpatient and home health PT, and now presents for OP PT.  He was discharged from inpatient rehab at a supervision level of assistance, ambulating with a RW.  His family notes that he is impulsive and will try to ambulate without his RW at home.  Family is currently providing 24/7 supervision.    How long can you sit comfortably?  no limitations    How long can you walk comfortably?  he requires a RW at home    Patient Stated Goals  get back to normal; want to be able to play golf and go out to play trivia again.  His daughters want him to regain independence with going to the bathroom at night    Currently in Pain?  No/denies         TREATMENT   Therapeutic Exercise: xRide L3-4 x 5 minutes for warm-up during history, resistance adjusted to match fatigue and intensity; TRX squats with chair taps 2 x 15; Standing hip flexion marching with 2.5# ankle weights (AW) x 15 each; Standing hip extension with 2.5# AW x 15 each; Standing hip abduction with 2.5# AW x  15 each; Standing HS curls with 2.5# AW x 15 each; Seated LAQ 3s hold with 2.5# AW x 10 bilateral; Seated clams with red tband 3s hold x 10; Seated adductor ball squeeze 3s hold x 10; STSs from standard chair without UE support(occasional hand support on knees but less than with Airex pad) x 10CGA;   Neuromuscular Reeducation: Rockerboard balance in A/P direction without UE support x 30s; Rockerboard balance in A/P direction with horizontal and then vertical head turns x 30s each; Rockerboard balance in A/P direction with weight shifts x 30s; Rockerboard balance in R/L direction without UE support x 30s; Rockerboard balance in R/L direction with horizontal and then vertical head turns x 30s each; Rockerboard balance in R/L direction with weight shifts x 30s;    Pt educated throughout session  about proper posture and technique with exercises. Improved exercise technique, movement at target joints, use of target muscles after min to mod verbal, visual, tactile cues.   Pt demonstrated good motivation throughout today's session.He is able to continue with his strengthening exercises. He continues to demonstrate imbalance on unstable surfaces such as rockerboard today. He requires repeated redirection during session due to distraction as well as cues to perform standing strengthening exercises correctly.Hewill benefit from skilled PT services to address deficits in balance and decrease risk for future falls.                         PT Short Term Goals - 11/03/19 1609      PT SHORT TERM GOAL #1   Title  Pt will be independent with HEP in order to improve strength and balance in order to decrease fall risk and improve function at home and work.    Time  6    Period  Weeks    Status  On-going    Target Date  11/02/19        PT Long Term Goals - 11/03/19 1609      PT LONG TERM GOAL #1   Title  Pt will improve BERG to >50 in order to decrease his fall risk.    Baseline  09/21/19: 42/56, 11/03/19: 47/56    Time  12    Period  Weeks    Status  Revised    Target Date  12/14/19      PT LONG TERM GOAL #2   Title  Pt will decrease TUG to below 14 seconds/decrease in order to demonstrate decreased fall risk.    Baseline  09/21/19: 22.19s; 11/03/19: 19.0s    Time  12    Period  Weeks    Status  Partially Met    Target Date  12/14/19      PT LONG TERM GOAL #3   Title  Pt will decrease 5TSTS by at least 3 seconds in order to demonstrate clinically significant improvement in LE strength.    Baseline  09/21/19: 32.28s; 11/03/19: 15.5s    Time  12    Period  Weeks    Status  Achieved      PT LONG TERM GOAL #4   Title  Pt will increase his 10MWT by at least 0.13 m/s in order to demonstrate a clinically significant improvement in gait speed.    Baseline   09/21/19: Self-selected: 20.9 s = 0.48 m/s; Fastest: 17.1s =0.58 m/s; 11/03/19: Self-selected: 17.7s s = 0.56 m/s; Fastest: 13.5s =0.74 m/s with walker    Time  12    Period  Weeks    Status  Partially Met    Target Date  12/14/19            Plan - 11/17/19 1258    Clinical Impression Statement  Pt demonstrated good motivation throughout today's session. He is able to continue with his strengthening exercises. He continues to demonstrate imbalance on unstable surfaces such as rockerboard today. He requires repeated redirection during session due to distraction as well as cues to perform standing strengthening exercises correctly. He will benefit from skilled PT services to address deficits in balance and decrease risk for future falls.    Personal Factors and Comorbidities  Age;Comorbidity 2    Examination-Activity Limitations  Bend;Lift;Squat;Locomotion Level;Stairs;Stand    Examination-Participation Restrictions  Community Activity;Driving    Stability/Clinical Decision Making  Evolving/Moderate complexity    Rehab Potential  Good    PT Frequency  2x / week    PT Duration  12 weeks    PT Treatment/Interventions  ADLs/Self Care Home Management;Canalith Repostioning;Cryotherapy;Electrical Stimulation;Iontophoresis 21m/ml Dexamethasone;Moist Heat;Traction;Ultrasound;DME Instruction;Gait training;Stair training;Therapeutic activities;Therapeutic exercise;Functional mobility training;Balance training;Neuromuscular re-education;Patient/family education;Manual techniques;Dry needling;Vestibular;Joint Manipulations;Spinal Manipulations    PT Next Visit Plan  progress strength and balance    PT Home Exercise Plan  Access Code: [email or web address]   Consulted and Agree with Plan of Care  Patient;Family member/caregiver    Family Member Consulted  Patient's granddaughter       Patient will benefit from skilled therapeutic intervention in order to improve the following deficits and impairments:  Abnormal  gait, Decreased balance, Decreased endurance, Decreased mobility, Difficulty walking, Decreased knowledge of precautions, Decreased safety awareness, Decreased activity tolerance, Decreased strength, Hypermobility  Visit Diagnosis: Muscle weakness (generalized)  Unsteadiness on feet     Problem List Patient Active Problem List   Diagnosis Date Noted  . 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 Great toe pain, right   . Lethargy   . CKD (chronic kidney disease), stage II   . Leukocytosis   . Benign essential HTN   . New onset atrial fibrillation ([place]   . Dysphagia, post-stroke   . Right middle cerebral artery stroke ([place] 05/27/2019  . Acute ischemic right MCA stroke ([place] 05/25/2019  . Goals of care, counseling/discussion   . Palliative care by specialist   . DNR (do not resuscitate) discussion   . Stroke, acute, embolic ([place] 003/70/4888 . Persistent proteinuria 07/30/2018  . 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 Chronic kidney disease, stage III (moderate) 06/15/2018  . Leg pain 05/06/2018  . Lymphedema 05/06/2018  . Coagulopathy ([place] 07/07/2017  . Lumbar spondylosis 03/10/2017  . Elevated uric acid in blood 11/20/2016  . BPH (benign prostatic hyperplasia) 05/29/2016  . Peripheral vascular disease of lower extremity ([place] 03/03/2016  . Gallstone 12/31/2015  . 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 Atherosclerosis of aorta ([place] 11/22/2015  . Carotid artery narrowing 11/22/2015  . Diverticulosis of colon 11/22/2015  . Decreased creatinine clearance 11/22/2015  . Arthritis, degenerative 11/22/2015  . Lactose intolerance 11/22/2015  . Basal cell carcinoma 11/22/2015  . Essential hypertension 06/21/2015  . Hyperlipemia 06/21/2015  . GERD (gastroesophageal reflux disease) 06/21/2015  . Calculus of kidney 11/18/2013   JPhillips GroutPT, DPT, GCS  Tikia Skilton 11/18/2019, 3:43 PM  [place] [facility] [address] [address]Phone: [phone]  Fax:  [phone] Name: Jose BONDYMRN: [id number]  of Birth: Oct 04, 1926

## 2019-11-18 NOTE — Therapy (Signed)
[place] MAIN [facility] SERVICES [address] [place], [place], [address] Phone: [phone]   Fax:  [phone]  Occupational Therapy Treatment  Patient Details  Name: Jose Castro MRN: [id number] Date of Birth: 01/14/1926 Referring Provider (OT): Steele Sizer   Encounter Date: 11/17/2019  OT End of Session - 11/18/19 1524    Visit Number  13    Number of Visits  24    Date for OT Re-Evaluation  12/14/19    OT Start Time  1345    OT Stop Time  1434    OT Time Calculation (min)  49 min    Activity Tolerance  Patient tolerated treatment well    Behavior During Therapy  [name] for tasks assessed/performed       Past Medical History:  Diagnosis Date  . Arthritis   . BPH (benign prostatic hyperplasia)   . Chronic kidney disease    had a kidney stone which per family was a cyst that was removed  . Dysrhythmia   . GERD (gastroesophageal reflux disease)   . History of kidney stones   . Hyperlipidemia   . Hypertension   . Splenic vein thrombosis   . Stroke ([place]) 05/22/2019  . Thrombosis 12/2015   mural  area and no notation of heart attack    Past Surgical History:  Procedure Laterality Date  . APPENDECTOMY    . CATARACT EXTRACTION, BILATERAL    . CYSTOSCOPY WITH INSERTION OF UROLIFT    . KIDNEY STONE SURGERY    . KNEE SURGERY    . PACEMAKER INSERTION N/A 07/13/2018   Procedure: INSERTION PACEMAKER-DUEL CHAMBER INITIAL IMPLANT;  Surgeon: Isaias Cowman, MD;  Location: [facility] ORS;  Service: Cardiovascular;  Laterality: N/A;  . SPINE SURGERY      There were no vitals filed for this visit.  Subjective Assessment - 11/18/19 1522    Subjective   Daughter arrived with patient and reports her continued concern about patients use of walker during functional tasks and safety with balance.    Pertinent History  Pt. is a 85 y.o. male who was [facility] to [name] with a [facility]arct on 05/21/2019. Pt. went through  Inpatient Rehab, and had home health OT, PT, and ST services. Pt. was previously residing home alone, and was independent with ADLs, and IADLs. Pt. now has family staying with the pt. around the clock.    Patient Stated Goals  to regain functional indpendence safely    Currently in Pain?  No/denies    Pain Score  0-No pain         Therapeutic Exercises:  Reassessment of hand skills strength, grip, pinch and coordination.  Please refer to flow sheet below for details.   Patient seen for resistive pinch skills in standing to further work towards challenging balance.  Patient performing all levels of resistance and placing pins on elevated surface with cues for reaching and type of pinch to use.  Patient demonstrates some difficulty with most resistive pinch with black pins.  From sitting, patient performing visual scanning tasks with sorting, stacking and organizing cards.  Occasional cues for left side attention to task.   Focus on tasks in standing to retrieve items from side table to place on larger table.  Patient educated on appropriate lengths of reach and when he would likely need to step closer to task or use walker.  Discussed in detail use of walker with stepping backwards for tasks such as toilet  transfers.  Completed multiple repetitions of each task.    Response to tx:   Patient continues to progress with all areas with strength, vision and attention to task.  He does require cues at times for safety with use of walker, reaching and balance.  Continue to work towards goals to increase independence in daily tasks.  Reinforce safety with daily tasks, use of walker and functional mobility.       [facility] OT Assessment - 11/18/19 0001      Coordination   Right 9 Hole Peg Test  27    Left 9 Hole Peg Test  35      Hand Function   Right Hand Grip (lbs)  55    Right Hand Lateral Pinch  16 lbs    Right Hand 3 Point Pinch  16 lbs    Left Hand Grip (lbs)  40    Left Hand Lateral Pinch  17  lbs    Left 3 point pinch  15 lbs                       OT Education - 11/18/19 1523    Education Details  functional mobility with walker and safety with tasks    Person(s) Educated  Patient;Child(ren)    Methods  Explanation;Demonstration    Comprehension  Verbalized understanding;Returned demonstration;Verbal cues required          OT Long Term Goals - 11/03/19 2032      OT LONG TERM GOAL #1   Title  Pt. will demonstrate home management tasks with supervision initating safe strategies    Baseline  Eval: Decreased safety awareness    Time  12    Period  Weeks    Status  On-going    Target Date  12/14/19      OT LONG TERM GOAL #2   Title  Pt. will be able to identify 100% of potential safety hazards in a simulated home environment during ADLs, and IADLs.    Baseline  Eval: Limited safety awareness    Time  12    Period  Weeks    Status  Partially Met    Target Date  12/14/19      OT LONG TERM GOAL #3   Title  Pt. will increase left grip strength by 5# of force to assist with holding items duirng ADLs, and IADLs.    Baseline  Eval: decreased left grip strength    Time  12    Period  Weeks    Status  On-going    Target Date  12/14/19      OT LONG TERM GOAL #4   Title  Pt. will improve left hand [facility] skills by 3 sec of speed to be able to manipulate objects during ADLs, and IADL tasks.    Baseline  Eval: Pt. has difficulty    Time  12    Period  Weeks    Status  On-going    Target Date  12/14/19      OT LONG TERM GOAL #5   Title  Pt. will demonstrate visual compensatory strategies as needed during ADLs, and IADL tasks.    Baseline  Eval: Limited left sided awareness    Time  12    Period  Weeks    Status  On-going    Target Date  12/14/19      OT LONG TERM GOAL #6   Title  Pt. will demonstrate cognitive  compensatory strategies during ADLs, and IADLs    Baseline  Eval: Limited cognition/problem solving, impulsive    Time  12    Period  Weeks     Status  On-going    Target Date  12/14/19            Plan - 11/18/19 1524    Clinical Impression Statement  Patient continues to progress with all areas with strength, vision and attention to task.  He does require cues at times for safety with use of walker, reaching and balance.  Continue to work towards goals to increase independence in daily tasks.  Reinforce safety with daily tasks, use of walker and functional mobility.    OT Occupational Profile and History  Detailed Assessment- Review of Records and additional review of physical, cognitive, psychosocial history related to current functional performance    Occupational performance deficits (Please refer to evaluation for details):  ADL's;IADL's    Body Structure / Function / Physical Skills  ADL;FMC;ROM;IADL;Strength;Decreased knowledge of use of DME;UE functional use    Rehab Potential  Excellent    Clinical Decision Making  Several treatment options, min-mod task modification necessary    Comorbidities Affecting Occupational Performance:  May have comorbidities impacting occupational performance    Modification or Assistance to Complete Evaluation   Min-Moderate modification of tasks or assist with assess necessary to complete eval    OT Frequency  2x / week    OT Duration  12 weeks    OT Treatment/Interventions  Self-care/ADL training;DME and/or AE instruction;Therapeutic exercise;Neuromuscular education;Patient/family education;Therapeutic activities    Consulted and Agree with Plan of Care  Patient       Patient will benefit from skilled therapeutic intervention in order to improve the following deficits and impairments:   Body Structure / Function / Physical Skills: ADL, FMC, ROM, IADL, Strength, Decreased knowledge of use of DME, UE functional use       Visit Diagnosis: Muscle weakness (generalized)  Unsteadiness on feet  Other lack of coordination    Problem List Patient Active Problem List   Diagnosis  Date Noted  . 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 Great toe pain, right   . Lethargy   . CKD (chronic kidney disease), stage II   . Leukocytosis   . Benign essential HTN   . New onset atrial fibrillation ([place])   . Dysphagia, post-stroke   . 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 Acute ischemic right MCA stroke ([place]) 05/25/2019  . Goals of care, counseling/discussion   . Palliative care by specialist   . DNR (do not resuscitate) discussion   . Stroke, acute, embolic ([place]) 45/85/9292  . Persistent proteinuria 07/30/2018  . 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 Chronic kidney disease, stage III (moderate) 06/15/2018  . Leg pain 05/06/2018  . Lymphedema 05/06/2018  . Coagulopathy ([place]) 07/07/2017  . Lumbar spondylosis 03/10/2017  . Elevated uric acid in blood 11/20/2016  . BPH (benign prostatic hyperplasia) 05/29/2016  . Peripheral vascular disease of lower extremity ([place]) 03/03/2016  . Gallstone 12/31/2015  . 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 Atherosclerosis of aorta ([place]) 11/22/2015  . Carotid artery narrowing 11/22/2015  . Diverticulosis of colon 11/22/2015  . Decreased creatinine clearance 11/22/2015  . Arthritis, degenerative 11/22/2015  . Lactose intolerance 11/22/2015  . Basal cell carcinoma 11/22/2015  . Essential hypertension 06/21/2015  . Hyperlipemia 06/21/2015  . GERD (gastroesophageal reflux disease)  06/21/2015  . Calculus of kidney 11/18/2013   Achilles Dunk, OTR/L, CLT  Tyyonna Soucy 11/18/2019, 3:36 PM  [place] MAIN [facility] SERVICES [address] [place], [place], [address] Phone: [phone]   Fax:  [phone]  Name: VASHAWN EKSTEIN MRN: [id number] Date of Birth: March 19, 1926

## 2019-11-21 ENCOUNTER — Ambulatory Visit: Payer: Medicare Other

## 2019-11-22 ENCOUNTER — Ambulatory Visit: Payer: Medicare Other

## 2019-11-22 ENCOUNTER — Other Ambulatory Visit: Payer: Self-pay

## 2019-11-22 ENCOUNTER — Ambulatory Visit: Payer: Medicare Other | Admitting: Occupational Therapy

## 2019-11-22 DIAGNOSIS — R2681 Unsteadiness on feet: Secondary | ICD-10-CM

## 2019-11-22 DIAGNOSIS — M6281 Muscle weakness (generalized): Secondary | ICD-10-CM | POA: Diagnosis not present

## 2019-11-22 DIAGNOSIS — R278 Other lack of coordination: Secondary | ICD-10-CM

## 2019-11-22 DIAGNOSIS — I63511 Cerebral infarction due to unspecified occlusion or stenosis of right middle cerebral artery: Secondary | ICD-10-CM

## 2019-11-22 NOTE — Therapy (Signed)
[place] MAIN [facility] SERVICES [address] [place], [place], [address] Phone: [phone]   Fax:  [phone]  Physical Therapy Treatment  Patient Details  Name: Jose Castro MRN: [id number] Date of Birth: January 06, 1926 Referring Provider (PT): Dr. Letta Pate   Encounter Date: 11/22/2019  PT End of Session - 11/22/19 1109    Visit Number  14    Number of Visits  25    Date for PT Re-Evaluation  12/14/19    PT Start Time  1103    PT Stop Time  1145    PT Time Calculation (min)  42 min    Equipment Utilized During Treatment  Gait belt    Activity Tolerance  Patient tolerated treatment well    Behavior During Therapy  [facility] for tasks assessed/performed       Past Medical History:  Diagnosis Date  . Arthritis   . BPH (benign prostatic hyperplasia)   . Chronic kidney disease    had a kidney stone which per family was a cyst that was removed  . Dysrhythmia   . GERD (gastroesophageal reflux disease)   . History of kidney stones   . Hyperlipidemia   . Hypertension   . Splenic vein thrombosis   . Stroke ([place]) 05/22/2019  . Thrombosis 12/2015   mural  area and no notation of heart attack    Past Surgical History:  Procedure Laterality Date  . APPENDECTOMY    . CATARACT EXTRACTION, BILATERAL    . CYSTOSCOPY WITH INSERTION OF UROLIFT    . KIDNEY STONE SURGERY    . KNEE SURGERY    . PACEMAKER INSERTION N/A 07/13/2018   Procedure: INSERTION PACEMAKER-DUEL CHAMBER INITIAL IMPLANT;  Surgeon: Isaias Cowman, MD;  Location: [facility] ORS;  Service: Cardiovascular;  Laterality: N/A;  . SPINE SURGERY      There were no vitals filed for this visit.  Subjective Assessment - 11/22/19 1108    Subjective  Pt reports he is doing well today. No reports of pain. Denies falls since last visit. No changes in health or medications since last visit. No specific questions or concerns.    Pertinent History  Pt is a 85 year old male who presents with  imbalance and difficulty with gait following a right frontal CVA with left hemiparesis on 05/22/19.  He has completed inpatient and home health PT, and now presents for OP PT.  He was discharged from inpatient rehab at a supervision level of assistance, ambulating with a RW.  His family notes that he is impulsive and will try to ambulate without his RW at home.  Family is currently providing 24/7 supervision.    How long can you sit comfortably?  no limitations    How long can you walk comfortably?  he requires a RW at home    Patient Stated Goals  get back to normal; want to be able to play golf and go out to play trivia again.  His daughters want him to regain independence with going to the bathroom at night    Currently in Pain?  No/denies           TREATMENT   Therapeutic Exercise: xRide L3-5 x 5 minutes for warm-up during history, resistance adjusted to match fatigue and intensity; TRX squats with chair taps 2 x 15; Seated marches with 3# ankle weight (AW) x 15 bilateral; Seated LAQ 3s hold with 3# AW x 15 bilateral; Seated clams with red tband 3s hold x 15; Seated adductor  ball squeeze 3s hold x 15; Standing hip extension with 3# AW x 15 each; Standing hip abduction with 3# AW x 15 each; Standing HS curls with 3# AW x 15 each;   Neuromuscular Reeducation: Gait in hallway without assistive device and CGA/minA+1 with horizontal and vertical head turns x 75' each; Gait in hallway while bouncing ball x 75,' Pt requires frequent redirection and stops frequently as he struggles to multitask with talking, bouncing and balancing at the same time;   Pt educated throughout session about proper posture and technique with exercises. Improved exercise technique, movement at target joints, use of target muscles after min to mod verbal, visual, tactile cues.   Pt demonstrated good motivation throughout today's session.He is able to continue with his strengthening exercises.  Continued with some seated strengthening due to reports of L knee pain during standing activity. Pt requires frequent redirection while walking without assistive device and stops frequently as he struggles to multitask with talking, bouncing ball and balancing at the same time. Will continue to incorporate multitasking during balance activities in future sessions.Hewill benefit from skilled PT services to address deficits in balance and decrease risk for future falls.                        PT Short Term Goals - 11/03/19 1609      PT SHORT TERM GOAL #1   Title  Pt will be independent with HEP in order to improve strength and balance in order to decrease fall risk and improve function at home and work.    Time  6    Period  Weeks    Status  On-going    Target Date  11/02/19        PT Long Term Goals - 11/03/19 1609      PT LONG TERM GOAL #1   Title  Pt will improve BERG to >50 in order to decrease his fall risk.    Baseline  09/21/19: 42/56, 11/03/19: 47/56    Time  12    Period  Weeks    Status  Revised    Target Date  12/14/19      PT LONG TERM GOAL #2   Title  Pt will decrease TUG to below 14 seconds/decrease in order to demonstrate decreased fall risk.    Baseline  09/21/19: 22.19s; 11/03/19: 19.0s    Time  12    Period  Weeks    Status  Partially Met    Target Date  12/14/19      PT LONG TERM GOAL #3   Title  Pt will decrease 5TSTS by at least 3 seconds in order to demonstrate clinically significant improvement in LE strength.    Baseline  09/21/19: 32.28s; 11/03/19: 15.5s    Time  12    Period  Weeks    Status  Achieved      PT LONG TERM GOAL #4   Title  Pt will increase his 10MWT by at least 0.13 m/s in order to demonstrate a clinically significant improvement in gait speed.    Baseline  09/21/19: Self-selected: 20.9 s = 0.48 m/s; Fastest: 17.1s =0.58 m/s; 11/03/19: Self-selected: 17.7s s = 0.56 m/s; Fastest: 13.5s =0.74 m/s with walker    Time   12    Period  Weeks    Status  Partially Met    Target Date  12/14/19            Plan -  11/22/19 1109    Clinical Impression Statement  Pt demonstrated good motivation throughout today's session. He is able to continue with his strengthening exercises. Continued with some seated strengthening due to reports of L knee pain during standing activity. Pt requires frequent redirection while walking without assistive device and stops frequently as he struggles to multitask with talking, bouncing ball and balancing at the same time. Will continue to incorporate multitasking during balance activities in future sessions. He will benefit from skilled PT services to address deficits in balance and decrease risk for future falls.    Personal Factors and Comorbidities  Age;Comorbidity 2    Examination-Activity Limitations  Bend;Lift;Squat;Locomotion Level;Stairs;Stand    Examination-Participation Restrictions  Community Activity;Driving    Stability/Clinical Decision Making  Evolving/Moderate complexity    Rehab Potential  Good    PT Frequency  2x / week    PT Duration  12 weeks    PT Treatment/Interventions  ADLs/Self Care Home Management;Canalith Repostioning;Cryotherapy;Electrical Stimulation;Iontophoresis 55m/ml Dexamethasone;Moist Heat;Traction;Ultrasound;DME Instruction;Gait training;Stair training;Therapeutic activities;Therapeutic exercise;Functional mobility training;Balance training;Neuromuscular re-education;Patient/family education;Manual techniques;Dry needling;Vestibular;Joint Manipulations;Spinal Manipulations    PT Next Visit Plan  progress strength and balance    PT Home Exercise Plan  Access Code: [email or web address]   Consulted and Agree with Plan of Care  Patient;Family member/caregiver    Family Member Consulted  Patient's granddaughter       Patient will benefit from skilled therapeutic intervention in order to improve the following deficits and impairments:  Abnormal gait, Decreased  balance, Decreased endurance, Decreased mobility, Difficulty walking, Decreased knowledge of precautions, Decreased safety awareness, Decreased activity tolerance, Decreased strength, Hypermobility  Visit Diagnosis: Muscle weakness (generalized)  Unsteadiness on feet     Problem List Patient Active Problem List   Diagnosis Date Noted  . 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 Great toe pain, right   . Lethargy   . CKD (chronic kidney disease), stage II   . Leukocytosis   . Benign essential HTN   . [place] fibrillation ([place]   . Dysphagia, post-stroke   . Right middle cerebral artery stroke ([place] 05/27/2019  . Acute ischemic right MCA stroke ([place] 05/25/2019  . Goals of care, counseling/discussion   . Palliative care by specialist   . DNR (do not resuscitate) discussion   . Stroke, acute, embolic ([place] 019/41/7408 . Persistent proteinuria 07/30/2018  . 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 Chronic kidney disease, stage III (moderate) 06/15/2018  . Leg pain 05/06/2018  . Lymphedema 05/06/2018  . Coagulopathy ([place] 07/07/2017  . Lumbar spondylosis 03/10/2017  . Elevated uric acid in blood 11/20/2016  . BPH (benign prostatic hyperplasia) 05/29/2016  . Peripheral vascular disease of lower extremity ([place] 03/03/2016  . Gallstone 12/31/2015  . 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 Atherosclerosis of aorta ([place] 11/22/2015  . Carotid artery narrowing 11/22/2015  . Diverticulosis of colon 11/22/2015  . Decreased creatinine clearance 11/22/2015  . Arthritis, degenerative 11/22/2015  . Lactose intolerance 11/22/2015  . Basal cell carcinoma 11/22/2015  . Essential hypertension 06/21/2015  . Hyperlipemia 06/21/2015  . GERD (gastroesophageal reflux disease) 06/21/2015  . Calculus of kidney 11/18/2013   JPhillips GroutPT, DPT, GCS  Jissell Trafton 11/22/2019, 5:07 PM  [place] [facility] [facility] [address]RF[address] [place] [address]Phone: [phone]  Fax:  [phone] Name: Jose XIONGMRN: [id number] of Birth: [birth year]

## 2019-11-23 ENCOUNTER — Ambulatory Visit: Payer: Medicare Other

## 2019-11-23 ENCOUNTER — Encounter: Payer: Self-pay | Admitting: Occupational Therapy

## 2019-11-23 NOTE — Therapy (Signed)
[place] MAIN [facility] SERVICES [address] [place], [place], [address] Phone: [phone]   Fax:  [phone]  Occupational Therapy Treatment  Patient Details  Name: Jose Castro MRN: [id number] Date of Birth: [birth year] Referring Provider (OT): Steele Sizer   Encounter Date: 11/22/2019  OT End of Session - 11/23/19 1558    Visit Number  14    Number of Visits  24    Date for OT Re-Evaluation  12/14/19    OT Start Time  1146    OT Stop Time  1230    OT Time Calculation (min)  44 min    Activity Tolerance  Patient tolerated treatment well    Behavior During Therapy  [facility] for tasks assessed/performed       Past Medical History:  Diagnosis Date  . Arthritis   . BPH (benign prostatic hyperplasia)   . Chronic kidney disease    had a kidney stone which per family was a cyst that was removed  . Dysrhythmia   . GERD (gastroesophageal reflux disease)   . History of kidney stones   . Hyperlipidemia   . Hypertension   . Splenic vein thrombosis   . Stroke ([place]) 05/22/2019  . Thrombosis 12/2015   mural  area and no notation of heart attack    Past Surgical History:  Procedure Laterality Date  . APPENDECTOMY    . CATARACT EXTRACTION, BILATERAL    . CYSTOSCOPY WITH INSERTION OF UROLIFT    . KIDNEY STONE SURGERY    . KNEE SURGERY    . PACEMAKER INSERTION N/A 07/13/2018   Procedure: INSERTION PACEMAKER-DUEL CHAMBER INITIAL IMPLANT;  Surgeon: Isaias Cowman, MD;  Location: [facility] ORS;  Service: Cardiovascular;  Laterality: N/A;  . SPINE SURGERY      There were no vitals filed for this visit.  Subjective Assessment - 11/23/19 1557    Subjective   Patient asking if his grand daughter can come back with him today to see how he is doing with therapy.    Pertinent History  Pt. is a 85 y.o. male who was admitted to [facility] with a [facility] DIstribution Infarct on 05/21/2019. Pt. went through Inpatient Rehab, and had home health OT,  PT, and ST services. Pt. was previously residing home alone, and was independent with ADLs, and IADLs. Pt. now has family staying with the pt. around the clock.    Patient Stated Goals  to regain functional indpendence safely    Currently in Pain?  No/denies    Pain Score  0-No pain         Therapeutic Activities: Patient seen this date for tasks in standing to work towards functional balance, reaching tasks with focus on self identifying when he needs to move closer to the object rather than reaching.  Incorporating attention to left side with placement of objects to encourage patient to look to left.  Patient performing fine motor coordination tasks with coins to pick up, use translatory skills of the hand and using the hand for storage with cues.    Response to tx:  Patient able to safely identify when to move closer to objects and when it was appropriate to reach within base of support.  Patient also able to move object closer to make it more accessible for repetitive motions.  Balance good for tasks this date, supervision only.  Continue to work towards goals in plan of care to increase safety and independence in daily tasks.  OT Education - 11/23/19 1557    Education Details  Safety with tasks in standing, use of walker with kitchen tasks    Person(s) Educated  Patient;Child(ren)    Methods  Explanation;Demonstration    Comprehension  Verbalized understanding;Returned demonstration;Verbal cues required          OT Long Term Goals - 11/03/19 2032      OT LONG TERM GOAL #1   Title  Pt. will demonstrate home management tasks with supervision initating safe strategies    Baseline  Eval: Decreased safety awareness    Time  12    Period  Weeks    Status  On-going    Target Date  12/14/19      OT LONG TERM GOAL #2   Title  Pt. will be able to identify 100% of potential safety hazards in a simulated home environment during ADLs, and IADLs.     Baseline  Eval: Limited safety awareness    Time  12    Period  Weeks    Status  Partially Met    Target Date  12/14/19      OT LONG TERM GOAL #3   Title  Pt. will increase left grip strength by 5# of force to assist with holding items duirng ADLs, and IADLs.    Baseline  Eval: decreased left grip strength    Time  12    Period  Weeks    Status  On-going    Target Date  12/14/19      OT LONG TERM GOAL #4   Title  Pt. will improve left hand [facility] skills by 3 sec of speed to be able to manipulate objects during ADLs, and IADL tasks.    Baseline  Eval: Pt. has difficulty    Time  12    Period  Weeks    Status  On-going    Target Date  12/14/19      OT LONG TERM GOAL #5   Title  Pt. will demonstrate visual compensatory strategies as needed during ADLs, and IADL tasks.    Baseline  Eval: Limited left sided awareness    Time  12    Period  Weeks    Status  On-going    Target Date  12/14/19      OT LONG TERM GOAL #6   Title  Pt. will demonstrate cognitive compensatory strategies during ADLs, and IADLs    Baseline  Eval: Limited cognition/problem solving, impulsive    Time  12    Period  Weeks    Status  On-going    Target Date  12/14/19            Plan - 11/23/19 1558    Clinical Impression Statement  Patient able to safely identify when to move closer to objects and when it was appropriate to reach within base of support.  Patient also able to move object closer to make it more accessible for repetitive motions.  Balance good for tasks this date, supervision only.  Continue to work towards goals in plan of care to increase safety and independence in daily tasks.    OT Occupational Profile and History  Detailed Assessment- Review of Records and additional review of physical, cognitive, psychosocial history related to current functional performance    Occupational performance deficits (Please refer to evaluation for details):  ADL's;IADL's    Body Structure / Function /  Physical Skills  ADL;FMC;ROM;IADL;Strength;Decreased knowledge of use of DME;UE functional use  Rehab Potential  Excellent    Clinical Decision Making  Several treatment options, min-mod task modification necessary    Comorbidities Affecting Occupational Performance:  May have comorbidities impacting occupational performance    Modification or Assistance to Complete Evaluation   Min-Moderate modification of tasks or assist with assess necessary to complete eval    OT Frequency  2x / week    OT Duration  12 weeks    OT Treatment/Interventions  Self-care/ADL training;DME and/or AE instruction;Therapeutic exercise;Neuromuscular education;Patient/family education;Therapeutic activities    Consulted and Agree with Plan of Care  Patient       Patient will benefit from skilled therapeutic intervention in order to improve the following deficits and impairments:   Body Structure / Function / Physical Skills: ADL, FMC, ROM, IADL, Strength, Decreased knowledge of use of DME, UE functional use       Visit Diagnosis: Muscle weakness (generalized)  Unsteadiness on feet  Other lack of coordination  Right middle cerebral artery stroke [facility])    Problem List Patient Active Problem List   Diagnosis Date Noted  . 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 Great toe pain, right   . Lethargy   . CKD (chronic kidney disease), stage II   . Leukocytosis   . Benign essential HTN   . New onset atrial fibrillation ([place])   . Dysphagia, post-stroke   . 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 Acute ischemic right MCA stroke ([place]) 05/25/2019  . Goals of care, counseling/discussion   . Palliative care by specialist   . DNR (do not resuscitate) discussion   . Stroke, acute, embolic ([place]) 49/67/5916  . Persistent proteinuria 07/30/2018  . 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 Chronic kidney disease, stage III (moderate) 06/15/2018  . Leg pain 05/06/2018  .  Lymphedema 05/06/2018  . Coagulopathy ([place]) 07/07/2017  . Lumbar spondylosis 03/10/2017  . Elevated uric acid in blood 11/20/2016  . BPH (benign prostatic hyperplasia) 05/29/2016  . Peripheral vascular disease of lower extremity ([place]) 03/03/2016  . Gallstone 12/31/2015  . 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 Atherosclerosis of aorta ([place]) 11/22/2015  . Carotid artery narrowing 11/22/2015  . Diverticulosis of colon 11/22/2015  . Decreased creatinine clearance 11/22/2015  . Arthritis, degenerative 11/22/2015  . Lactose intolerance 11/22/2015  . Basal cell carcinoma 11/22/2015  . Essential hypertension 06/21/2015  . Hyperlipemia 06/21/2015  . GERD (gastroesophageal reflux disease) 06/21/2015  . Calculus of kidney 11/18/2013   Achilles Dunk, OTR/L, CLT  Ediberto Sens 11/24/2019, 1:46 PM  [place] MAIN [facility] SERVICES [address] [place], [place], [address] Phone: [phone]   Fax:  [phone]  Name: Jose Castro MRN: [id number] Date of Birth: 1926/01/02

## 2019-11-24 ENCOUNTER — Other Ambulatory Visit: Payer: Self-pay

## 2019-11-24 ENCOUNTER — Encounter: Payer: Self-pay | Admitting: Occupational Therapy

## 2019-11-24 ENCOUNTER — Ambulatory Visit: Payer: Medicare Other

## 2019-11-24 ENCOUNTER — Ambulatory Visit: Payer: Medicare Other | Admitting: Occupational Therapy

## 2019-11-24 DIAGNOSIS — R278 Other lack of coordination: Secondary | ICD-10-CM

## 2019-11-24 DIAGNOSIS — R2681 Unsteadiness on feet: Secondary | ICD-10-CM

## 2019-11-24 DIAGNOSIS — M6281 Muscle weakness (generalized): Secondary | ICD-10-CM

## 2019-11-24 NOTE — Therapy (Signed)
[place] MAIN [facility] SERVICES [address] [place], [place], [address] Phone: [phone]   Fax:  [phone]  Occupational Therapy Treatment  Jose Castro Details  Name: Jose Castro MRN: [id number] Date of Birth: 08-18-1926 Referring Provider (OT): Steele Sizer   Encounter Date: 11/24/2019  OT End of Session - 11/24/19 1349    Visit Number  15    Number of Visits  24    Date for OT Re-Evaluation  12/14/19    OT Start Time  1100    OT Stop Time  1139    OT Time Calculation (min)  39 min    Activity Tolerance  Jose Castro tolerated treatment well    Behavior During Therapy  [facility] for tasks assessed/performed       Past Medical History:  Diagnosis Date  . Arthritis   . BPH (benign prostatic hyperplasia)   . Chronic kidney disease    had a kidney stone which per family was a cyst that was removed  . Dysrhythmia   . GERD (gastroesophageal reflux disease)   . History of kidney stones   . Hyperlipidemia   . Hypertension   . Splenic vein thrombosis   . Stroke ([place]) 05/22/2019  . Thrombosis 12/2015   mural  area and no notation of heart attack    Past Surgical History:  Procedure Laterality Date  . APPENDECTOMY    . CATARACT EXTRACTION, BILATERAL    . CYSTOSCOPY WITH INSERTION OF UROLIFT    . KIDNEY STONE SURGERY    . KNEE SURGERY    . PACEMAKER INSERTION N/A 07/13/2018   Procedure: INSERTION PACEMAKER-DUEL CHAMBER INITIAL IMPLANT;  Surgeon: Isaias Cowman, MD;  Location: [facility] ORS;  Service: Cardiovascular;  Laterality: N/A;  . SPINE SURGERY      There were no vitals filed for this visit.  Subjective Assessment - 11/24/19 1348    Subjective   Jose Castro reports he will prepare breakfast for his family tomorrow for Christmas, will have supervision and assistance as needed.  Plans to prepare bacon and eggs.    Pertinent History  Pt. is a 85 y.o. male who was admitted to [facility] with a [facility] DIstribution Infarct on  05/21/2019. Pt. went through Inpatient Rehab, and had home health OT, PT, and ST services. Pt. was previously residing home alone, and was independent with ADLs, and IADLs. Pt. now has family staying with the pt. around the clock.    Jose Castro Stated Goals  to regain functional indpendence safely    Currently in Pain?  No/denies    Pain Score  0-No pain       Therapeutic Exercises: Jose Castro seen for UB strengthening exercises with 5# dowel for shoulder flexion, ABD/ADD, chest press, forwards and backwards circles, diagonal patterns, elbow flexion/extension, wrist flexion/extension for 15 repetitions for multiple sets each with cues for form and technique.  Short rest breaks as needed.  Jose Castro seen for upper body reciprocal strengthening with resistance of 4.0 to 4.5 for 8 mins, forwards/backwards, alternating levels of resistance with therapist in constant attendance to ensure grip and adjust settings/resistance.     Response to tx:   Jose Castro continues to progress, he is easily distracted and wanted to talk about his experience yesterday with getting assessed for hearing aids.  He didn't feel the test was valid and argued that they should have given him a second chance on the ones he missed.  He reports he pressed the button everytime he heard a noise and  did not believe he missed any.  Very difficult to redirect this date to task, he kept going back to conversation about this.  Continue to work towards goals in plan of care to increase safety and independence in daily tasks .                     OT Education - 11/24/19 1349    Education Details  strengthening    Person(s) Educated  Jose Castro    Methods  Explanation;Demonstration    Comprehension  Verbalized understanding;Returned demonstration;Verbal cues required          OT Long Term Goals - 11/03/19 2032      OT LONG TERM GOAL #1   Title  Pt. will demonstrate home management tasks with supervision initating safe  strategies    Baseline  Eval: Decreased safety awareness    Time  12    Period  Weeks    Status  On-going    Target Date  12/14/19      OT LONG TERM GOAL #2   Title  Pt. will be able to identify 100% of potential safety hazards in a simulated home environment during ADLs, and IADLs.    Baseline  Eval: Limited safety awareness    Time  12    Period  Weeks    Status  Partially Met    Target Date  12/14/19      OT LONG TERM GOAL #3   Title  Pt. will increase left grip strength by 5# of force to assist with holding items duirng ADLs, and IADLs.    Baseline  Eval: decreased left grip strength    Time  12    Period  Weeks    Status  On-going    Target Date  12/14/19      OT LONG TERM GOAL #4   Title  Pt. will improve left hand [facility] skills by 3 sec of speed to be able to manipulate objects during ADLs, and IADL tasks.    Baseline  Eval: Pt. has difficulty    Time  12    Period  Weeks    Status  On-going    Target Date  12/14/19      OT LONG TERM GOAL #5   Title  Pt. will demonstrate visual compensatory strategies as needed during ADLs, and IADL tasks.    Baseline  Eval: Limited left sided awareness    Time  12    Period  Weeks    Status  On-going    Target Date  12/14/19      OT LONG TERM GOAL #6   Title  Pt. will demonstrate cognitive compensatory strategies during ADLs, and IADLs    Baseline  Eval: Limited cognition/problem solving, impulsive    Time  12    Period  Weeks    Status  On-going    Target Date  12/14/19            Plan - 11/24/19 1350    Clinical Impression Statement  Jose Castro continues to progress, he is easily distracted and wanted to talk about his experience yesterday with getting assessed for hearing aids.  He didn't feel the test was valid and argued that they should have given him a second chance on the ones he missed.  He reports he pressed the button everytime he heard a noise and did not believe he missed any.  Very difficult to redirect this date  to task, he  kept going back to conversation about this.  Continue to work towards goals in plan of care to increase safety and independence in daily tasks .    OT Occupational Profile and History  Detailed Assessment- Review of Records and additional review of physical, cognitive, psychosocial history related to current functional performance    Occupational performance deficits (Please refer to evaluation for details):  ADL's;IADL's    Body Structure / Function / Physical Skills  ADL;FMC;ROM;IADL;Strength;Decreased knowledge of use of DME;UE functional use    Rehab Potential  Excellent    Clinical Decision Making  Several treatment options, min-mod task modification necessary    Comorbidities Affecting Occupational Performance:  May have comorbidities impacting occupational performance    Modification or Assistance to Complete Evaluation   Min-Moderate modification of tasks or assist with assess necessary to complete eval    OT Frequency  2x / week    OT Duration  12 weeks    OT Treatment/Interventions  Self-care/ADL training;DME and/or AE instruction;Therapeutic exercise;Neuromuscular education;Jose Castro/family education;Therapeutic activities    Consulted and Agree with Plan of Care  Jose Castro       Jose Castro will benefit from skilled therapeutic intervention in order to improve the following deficits and impairments:   Body Structure / Function / Physical Skills: ADL, FMC, ROM, IADL, Strength, Decreased knowledge of use of DME, UE functional use       Visit Diagnosis: Muscle weakness (generalized)  Other lack of coordination  Unsteadiness on feet    Problem List Jose Castro Active Problem List   Diagnosis Date Noted  . 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 Great toe pain, right   . Lethargy   . CKD (chronic kidney disease), stage II   . Leukocytosis   . Benign essential HTN   . New onset atrial fibrillation ([place])   . Dysphagia, post-stroke   . Right middle cerebral  artery stroke ([place]) 05/27/2019  . Acute ischemic right MCA stroke ([place]) 05/25/2019  . Goals of care, counseling/discussion   . Palliative care by specialist   . DNR (do not resuscitate) discussion   . Stroke, acute, embolic ([place]) 56/38/7564  . Persistent proteinuria 07/30/2018  . 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 Chronic kidney disease, stage III (moderate) 06/15/2018  . Leg pain 05/06/2018  . Lymphedema 05/06/2018  . Coagulopathy ([place]) 07/07/2017  . Lumbar spondylosis 03/10/2017  . Elevated uric acid in blood 11/20/2016  . BPH (benign prostatic hyperplasia) 05/29/2016  . Peripheral vascular disease of lower extremity ([place]) 03/03/2016  . Gallstone 12/31/2015  . 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 Atherosclerosis of aorta ([place]) 11/22/2015  . Carotid artery narrowing 11/22/2015  . Diverticulosis of colon 11/22/2015  . Decreased creatinine clearance 11/22/2015  . Arthritis, degenerative 11/22/2015  . Lactose intolerance 11/22/2015  . Basal cell carcinoma 11/22/2015  . Essential hypertension 06/21/2015  . Hyperlipemia 06/21/2015  . GERD (gastroesophageal reflux disease) 06/21/2015  . Calculus of kidney 11/18/2013   Achilles Dunk, OTR/L, CLT  Dianely Krehbiel 11/24/2019, 1:58 PM  [place] MAIN [facility] SERVICES [address] [place], [place], [address] Phone: [phone]   Fax:  [phone]  Name: Jose Castro MRN: [id number] Date of Birth: [birth year]

## 2019-11-24 NOTE — Therapy (Signed)
[place] MAIN [facility] SERVICES [address] [place], [place], [address] Phone: [phone]   Fax:  [phone]  Physical Therapy Treatment  Patient Details  Name: Jose Castro MRN: [id number] Date of Birth: 1926/08/26 Referring Provider (PT): Dr. Letta Pate   Encounter Date: 11/24/2019  PT End of Session - 11/24/19 1224    Visit Number  15    Number of Visits  25    Date for PT Re-Evaluation  12/14/19    PT Start Time  1017    PT Stop Time  1100    PT Time Calculation (min)  43 min    Equipment Utilized During Treatment  Gait belt    Activity Tolerance  Patient tolerated treatment well    Behavior During Therapy  [facility] for tasks assessed/performed       Past Medical History:  Diagnosis Date  . Arthritis   . BPH (benign prostatic hyperplasia)   . Chronic kidney disease    had a kidney stone which per family was a cyst that was removed  . Dysrhythmia   . GERD (gastroesophageal reflux disease)   . History of kidney stones   . Hyperlipidemia   . Hypertension   . Splenic vein thrombosis   . Stroke ([place]) 05/22/2019  . Thrombosis 12/2015   mural  area and no notation of heart attack    Past Surgical History:  Procedure Laterality Date  . APPENDECTOMY    . CATARACT EXTRACTION, BILATERAL    . CYSTOSCOPY WITH INSERTION OF UROLIFT    . KIDNEY STONE SURGERY    . KNEE SURGERY    . PACEMAKER INSERTION N/A 07/13/2018   Procedure: INSERTION PACEMAKER-DUEL CHAMBER INITIAL IMPLANT;  Surgeon: Isaias Cowman, MD;  Location: [facility] ORS;  Service: Cardiovascular;  Laterality: N/A;  . SPINE SURGERY      There were no vitals filed for this visit.  Subjective Assessment - 11/24/19 1221    Subjective  Pt reports he is doing well today. He reports some chronic L knee pain upon arrival but doesn't rate with questioning. Denies falls since last visit. No changes in health or medications since last visit. No specific questions or concerns.    Pertinent History  Pt is a 85 year old male who presents with imbalance and difficulty with gait following a right frontal CVA with left hemiparesis on 05/22/19.  He has completed inpatient and home health PT, and now presents for OP PT.  He was discharged from inpatient rehab at a supervision level of assistance, ambulating with a RW.  His family notes that he is impulsive and will try to ambulate without his RW at home.  Family is currently providing 24/7 supervision.    How long can you sit comfortably?  no limitations    How long can you walk comfortably?  he requires a RW at home    Patient Stated Goals  get back to normal; want to be able to play golf and go out to play trivia again.  His daughters want him to regain independence with going to the bathroom at night    Currently in Pain?  Yes    Pain Score  --   Doesn't rate when questioned   Pain Location  Knee    Pain Orientation  Left    Pain Descriptors / Indicators  Aching    Pain Type  Chronic pain           TREATMENT   Therapeutic Exercise: xRide L3-5 x  5 minutes for warm-up during history, resistance adjusted to match fatigue and intensity; TRX squats with chair taps 2 x 15; Seated marches with 4# ankle weight (AW) x 15 bilateral; Seated LAQ 3s hold with 4# AW x 15bilateral; Seated clams with green tband 3s hold x 15; Seated adductor ball squeeze 3s hold x 15; Standing hip flexion march with 4# AW x 15 each; Standing hip extension with 4# AW x 15 each; Standing hip abduction with 4# AW x 15 each; Standing HS curls with 4# AW x 15 each;   Neuromuscular Reeducation: Obstacle course in // bars with 1/2 foam roll x 2, double airex pad step up/down, and hurdle step x 6 lengths;  Forward gait in gym without assistive device and CGA/minA+1 while bouncing ball x 35'; Retro gait in gym without assistive device and CGA/minA+1 while bouncing ball x 35'; Forward gait in gym while naming animals that start with sequential  letters of the alphabet x 70';   Pt educated throughout session about proper posture and technique with exercises. Improved exercise technique, movement at target joints, use of target muscles after min to mod verbal, visual, tactile cues.   Pt demonstrated good motivation throughout today's session.He is able to continue with his strengthening exercises. Pt continues to demonstrate difficulty with balance while multitasking such as bouncing a ball or performing cognitive task. Will continue to incorporate multitasking during balance activities in future sessions. He is able to complete all strengthening exercises without reporting increase in L knee pain.Hewill benefit from skilled PT services to address deficits in balance and decrease risk for future falls.                       PT Short Term Goals - 11/03/19 1609      PT SHORT TERM GOAL #1   Title  Pt will be independent with HEP in order to improve strength and balance in order to decrease fall risk and improve function at home and work.    Time  6    Period  Weeks    Status  On-going    Target Date  11/02/19        PT Long Term Goals - 11/03/19 1609      PT LONG TERM GOAL #1   Title  Pt will improve BERG to >50 in order to decrease his fall risk.    Baseline  09/21/19: 42/56, 11/03/19: 47/56    Time  12    Period  Weeks    Status  Revised    Target Date  12/14/19      PT LONG TERM GOAL #2   Title  Pt will decrease TUG to below 14 seconds/decrease in order to demonstrate decreased fall risk.    Baseline  09/21/19: 22.19s; 11/03/19: 19.0s    Time  12    Period  Weeks    Status  Partially Met    Target Date  12/14/19      PT LONG TERM GOAL #3   Title  Pt will decrease 5TSTS by at least 3 seconds in order to demonstrate clinically significant improvement in LE strength.    Baseline  09/21/19: 32.28s; 11/03/19: 15.5s    Time  12    Period  Weeks    Status  Achieved      PT LONG TERM GOAL #4    Title  Pt will increase his 10MWT by at least 0.13 m/s in order to demonstrate a clinically  significant improvement in gait speed.    Baseline  09/21/19: Self-selected: 20.9 s = 0.48 m/s; Fastest: 17.1s =0.58 m/s; 11/03/19: Self-selected: 17.7s s = 0.56 m/s; Fastest: 13.5s =0.74 m/s with walker    Time  12    Period  Weeks    Status  Partially Met    Target Date  12/14/19            Plan - 11/24/19 1224    Clinical Impression Statement  Pt demonstrated good motivation throughout today's session. He is able to continue with his strengthening exercises. Pt continues to demonstrate difficulty with balance while multitasking such as bouncing a ball or performing cognitive task. Will continue to incorporate multitasking during balance activities in future sessions. He is able to complete all strengthening exercises without reporting increase in L knee pain. He will benefit from skilled PT services to address deficits in balance and decrease risk for future falls.    Personal Factors and Comorbidities  Age;Comorbidity 2    Examination-Activity Limitations  Bend;Lift;Squat;Locomotion Level;Stairs;Stand    Examination-Participation Restrictions  Community Activity;Driving    Stability/Clinical Decision Making  Evolving/Moderate complexity    Rehab Potential  Good    PT Frequency  2x / week    PT Duration  12 weeks    PT Treatment/Interventions  ADLs/Self Care Home Management;Canalith Repostioning;Cryotherapy;Electrical Stimulation;Iontophoresis 50m/ml Dexamethasone;Moist Heat;Traction;Ultrasound;DME Instruction;Gait training;Stair training;Therapeutic activities;Therapeutic exercise;Functional mobility training;Balance training;Neuromuscular re-education;Patient/family education;Manual techniques;Dry needling;Vestibular;Joint Manipulations;Spinal Manipulations    PT Next Visit Plan  progress strength and balance    PT Home Exercise Plan  Access Code: [email or web address]   Consulted and Agree with Plan of Care   Patient;Family member/caregiver    Family Member Consulted  Patient's granddaughter       Patient will benefit from skilled therapeutic intervention in order to improve the following deficits and impairments:  Abnormal gait, Decreased balance, Decreased endurance, Decreased mobility, Difficulty walking, Decreased knowledge of precautions, Decreased safety awareness, Decreased activity tolerance, Decreased strength, Hypermobility  Visit Diagnosis: Unsteadiness on feet  Muscle weakness (generalized)     Problem List Patient Active Problem List   Diagnosis Date Noted  . 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 Great toe pain, right   . Lethargy   . CKD (chronic kidney disease), stage II   . Leukocytosis   . Benign essential HTN   . New onset atrial fibrillation ([place]   . Dysphagia, post-stroke   . Right middle cerebral artery stroke ([place] 05/27/2019  . Acute ischemic right MCA stroke ([place] 05/25/2019  . Goals of care, counseling/discussion   . Palliative care by specialist   . DNR (do not resuscitate) discussion   . Stroke, acute, embolic ([place] 048/88/9169 . Persistent proteinuria 07/30/2018  . 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 Chronic kidney disease, stage III (moderate) 06/15/2018  . Leg pain 05/06/2018  . Lymphedema 05/06/2018  . Coagulopathy ([place] 07/07/2017  . Lumbar spondylosis 03/10/2017  . Elevated uric acid in blood 11/20/2016  . BPH (benign prostatic hyperplasia) 05/29/2016  . Peripheral vascular disease of lower extremity (HR[place] 03/03/2016  . Gallstone 12/31/2015  . 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 Atherosclerosis of aorta ([place] 11/22/2015  . Carotid artery narrowing 11/22/2015  . Diverticulosis of colon 11/22/2015  . Decreased creatinine clearance 11/22/2015  . Arthritis, degenerative 11/22/2015  . Lactose intolerance 11/22/2015  . Basal cell carcinoma 11/22/2015  . Essential hypertension  06/21/2015  . Hyperlipemia 06/21/2015  .  GERD (gastroesophageal reflux disease) 06/21/2015  . Calculus of kidney 11/18/2013   Phillips Grout PT, DPT, GCS  Kaimen Peine 11/24/2019, 12:26 PM  [place] MAIN [facility] SERVICES [address] [place], [place], [address] Phone: [phone]   Fax:  [phone]  Name: Jose Castro MRN: [id number] Date of Birth: Nov 29, 1926

## 2019-11-28 ENCOUNTER — Ambulatory Visit: Payer: Medicare Other

## 2019-11-29 ENCOUNTER — Encounter: Payer: Self-pay | Admitting: Occupational Therapy

## 2019-11-29 ENCOUNTER — Ambulatory Visit: Payer: Medicare Other | Admitting: Occupational Therapy

## 2019-11-29 ENCOUNTER — Ambulatory Visit: Payer: Medicare Other

## 2019-11-29 ENCOUNTER — Other Ambulatory Visit: Payer: Self-pay

## 2019-11-29 DIAGNOSIS — M6281 Muscle weakness (generalized): Secondary | ICD-10-CM

## 2019-11-29 DIAGNOSIS — R278 Other lack of coordination: Secondary | ICD-10-CM

## 2019-11-29 DIAGNOSIS — R2681 Unsteadiness on feet: Secondary | ICD-10-CM

## 2019-11-29 NOTE — Therapy (Signed)
[place] MAIN [facility] SERVICES [address] [place], [place], [address] Phone: [phone]   Fax:  [phone]  Physical Therapy Treatment  Patient Details  Name: Jose Castro MRN: [id number] Date of Birth: 12-23-1925 Referring Provider (PT): Dr. Letta Pate   Encounter Date: 11/29/2019  PT End of Session - 11/29/19 1309    Visit Number  16    Number of Visits  25    Date for PT Re-Evaluation  12/14/19    PT Start Time  1300    PT Stop Time  1345    PT Time Calculation (min)  45 min    Equipment Utilized During Treatment  Gait belt    Activity Tolerance  Patient tolerated treatment well    Behavior During Therapy  [facility] for tasks assessed/performed       Past Medical History:  Diagnosis Date  . Arthritis   . BPH (benign prostatic hyperplasia)   . Chronic kidney disease    had a kidney stone which per family was a cyst that was removed  . Dysrhythmia   . GERD (gastroesophageal reflux disease)   . History of kidney stones   . Hyperlipidemia   . Hypertension   . Splenic vein thrombosis   . Stroke ([place]) 05/22/2019  . Thrombosis 12/2015   mural  area and no notation of heart attack    Past Surgical History:  Procedure Laterality Date  . APPENDECTOMY    . CATARACT EXTRACTION, BILATERAL    . CYSTOSCOPY WITH INSERTION OF UROLIFT    . KIDNEY STONE SURGERY    . KNEE SURGERY    . PACEMAKER INSERTION N/A 07/13/2018   Procedure: INSERTION PACEMAKER-DUEL CHAMBER INITIAL IMPLANT;  Surgeon: Isaias Cowman, MD;  Location: [facility] ORS;  Service: Cardiovascular;  Laterality: N/A;  . SPINE SURGERY      There were no vitals filed for this visit.  Subjective Assessment - 11/29/19 1313    Subjective  Pt reports he is doing well today. He denies any resting pain upon arrival today. Denies falls since last visit. No changes in health or medications since last visit. No specific questions or concerns.    Pertinent History  Pt is a 85 year old male  who presents with imbalance and difficulty with gait following a right frontal CVA with left hemiparesis on 05/22/19.  He has completed inpatient and home health PT, and now presents for OP PT.  He was discharged from inpatient rehab at a supervision level of assistance, ambulating with a RW.  His family notes that he is impulsive and will try to ambulate without his RW at home.  Family is currently providing 24/7 supervision.    How long can you sit comfortably?  no limitations    How long can you walk comfortably?  he requires a RW at home    Patient Stated Goals  get back to normal; want to be able to play golf and go out to play trivia again.  His daughters want him to regain independence with going to the bathroom at night    Currently in Pain?  No/denies         TREATMENT   Therapeutic Exercise xRide L3/8 intervalsx 5 minutes for warm-up during history, resistance adjusted to match fatigue and intensity (3 minutes unbilled); TRX squats with chair taps 2 x 15; Seated marches with 4# ankle weight (AW) x 15 bilateral; Seated LAQ 3s hold with4# AW x 15bilateral; Seated clams with green tband 3s hold x  15; Seated adductor ball squeeze 3s hold x 15; Seated HS curls withgreen tband x 15 bilateral; Seated heel raises with manual resistance x 15 bilateral; Matrix resisted gait with 7.5# x 2 forward, x 2 backward, x 1 R lateral, and x 1 L lateral;   Neuromuscular Reeducation Forward gait in gym withoutassistive deviceand CGA/minA+1 while bouncing ball and performing serial 7 addition starting at 0 x 35'; Retro gait in gym withoutassistive deviceand CGA/minA+1 while bouncing ball and performing serial 4 subtraction starting at 100 x 35';   Pt educated throughout session about proper posture and technique with exercises. Improved exercise technique, movement at target joints, use of target muscles after min to mod verbal, visual, tactile cues.   Pt demonstrated good  motivation throughout today's session.He is able to continue with his strengthening exercises and again focused on more seated activities due to reports of increase in L knee pain when in standing which limits these exercises. Initiated Matrix resisted gait today and continued to incorporate multitasking during balance activities due to worsening balance and gait when cognitive/motor tasks are utilized.Hewill benefit from skilled PT services to address deficits in balance and decrease risk for future falls.                        PT Short Term Goals - 11/03/19 1609      PT SHORT TERM GOAL #1   Title  Pt will be independent with HEP in order to improve strength and balance in order to decrease fall risk and improve function at home and work.    Time  6    Period  Weeks    Status  On-going    Target Date  11/02/19        PT Long Term Goals - 11/03/19 1609      PT LONG TERM GOAL #1   Title  Pt will improve BERG to >50 in order to decrease his fall risk.    Baseline  09/21/19: 42/56, 11/03/19: 47/56    Time  12    Period  Weeks    Status  Revised    Target Date  12/14/19      PT LONG TERM GOAL #2   Title  Pt will decrease TUG to below 14 seconds/decrease in order to demonstrate decreased fall risk.    Baseline  09/21/19: 22.19s; 11/03/19: 19.0s    Time  12    Period  Weeks    Status  Partially Met    Target Date  12/14/19      PT LONG TERM GOAL #3   Title  Pt will decrease 5TSTS by at least 3 seconds in order to demonstrate clinically significant improvement in LE strength.    Baseline  09/21/19: 32.28s; 11/03/19: 15.5s    Time  12    Period  Weeks    Status  Achieved      PT LONG TERM GOAL #4   Title  Pt will increase his 10MWT by at least 0.13 m/s in order to demonstrate a clinically significant improvement in gait speed.    Baseline  09/21/19: Self-selected: 20.9 s = 0.48 m/s; Fastest: 17.1s =0.58 m/s; 11/03/19: Self-selected: 17.7s s = 0.56 m/s;  Fastest: 13.5s =0.74 m/s with walker    Time  12    Period  Weeks    Status  Partially Met    Target Date  12/14/19            Plan -  11/29/19 1313    Clinical Impression Statement  Pt demonstrated good motivation throughout today's session. He is able to continue with his strengthening exercises and again focused on more seated activities due to reports of increase in L knee pain when in standing which limits these exercises. Initiated Matrix resisted gait today and continued to incorporate multitasking during balance activities due to worsening balance and gait when cognitive/motor tasks are utilized. He will benefit from skilled PT services to address deficits in balance and decrease risk for future falls.    Personal Factors and Comorbidities  Age;Comorbidity 2    Comorbidities  HTN, CVA    Examination-Activity Limitations  Bend;Lift;Squat;Locomotion Level;Stairs;Stand    Examination-Participation Restrictions  Community Activity;Driving    Stability/Clinical Decision Making  Evolving/Moderate complexity    Rehab Potential  Good    PT Frequency  2x / week    PT Duration  12 weeks    PT Treatment/Interventions  ADLs/Self Care Home Management;Canalith Repostioning;Cryotherapy;Electrical Stimulation;Iontophoresis 8m/ml Dexamethasone;Moist Heat;Traction;Ultrasound;DME Instruction;Gait training;Stair training;Therapeutic activities;Therapeutic exercise;Functional mobility training;Balance training;Neuromuscular re-education;Patient/family education;Manual techniques;Dry needling;Vestibular;Joint Manipulations;Spinal Manipulations    PT Next Visit Plan  progress strength and balance    PT Home Exercise Plan  Access Code: [email or web address]   Consulted and Agree with Plan of Care  Patient;Family member/caregiver    Family Member Consulted  Patient's granddaughter       Patient will benefit from skilled therapeutic intervention in order to improve the following deficits and impairments:  Abnormal  gait, Decreased balance, Decreased endurance, Decreased mobility, Difficulty walking, Decreased knowledge of precautions, Decreased safety awareness, Decreased activity tolerance, Decreased strength, Hypermobility  Visit Diagnosis: Unsteadiness on feet  Muscle weakness (generalized)     Problem List Patient Active Problem List   Diagnosis Date Noted  . 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 Great toe pain, right   . Lethargy   . CKD (chronic kidney disease), stage II   . Leukocytosis   . Benign essential HTN   . New onset atrial fibrillation ([place]   . Dysphagia, post-stroke   . Right middle cerebral artery stroke (HT[place] 05/27/2019  . Acute ischemic right MCA stroke ([place] 05/25/2019  . Goals of care, counseling/discussion   . Palliative care by specialist   . DNR (do not resuscitate) discussion   . Stroke, acute, embolic ([place] 049/70/2637 . Persistent proteinuria 07/30/2018  . 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 Chronic kidney disease, stage III (moderate) 06/15/2018  . Leg pain 05/06/2018  . Lymphedema 05/06/2018  . Coagulopathy ([place] 07/07/2017  . Lumbar spondylosis 03/10/2017  . Elevated uric acid in blood 11/20/2016  . BPH (benign prostatic hyperplasia) 05/29/2016  . Peripheral vascular disease of lower extremity (HY[place] 03/03/2016  . Gallstone 12/31/2015  . 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 Atherosclerosis of aorta (HE[place] 11/22/2015  . Carotid artery narrowing 11/22/2015  . Diverticulosis of colon 11/22/2015  . Decreased creatinine clearance 11/22/2015  . Arthritis, degenerative 11/22/2015  . Lactose intolerance 11/22/2015  . Basal cell carcinoma 11/22/2015  . Essential hypertension 06/21/2015  . Hyperlipemia 06/21/2015  . GERD (gastroesophageal reflux disease) 06/21/2015  . Calculus of kidney 11/18/2013   JPhillips GroutPT, DPT, GCS  Tallie Hevia 11/29/2019, 1:53 PM  [place] [facility] [address] [address]Phone: [phone]  Fax:  [phone] Name: Jose LISZEWSKIMRN: [id number] of Birth: 9Jun 24, 1927

## 2019-11-30 ENCOUNTER — Ambulatory Visit: Payer: Medicare Other

## 2019-11-30 NOTE — Therapy (Signed)
[place] MAIN [facility] SERVICES [address] [place], [place], [address] Phone: [phone]   Fax:  [phone]  Occupational Therapy Treatment  Patient Details  Name: Jose Castro MRN: [id number] Date of Birth: 07-Dec-1925 Referring Provider (OT): Steele Sizer   Encounter Date: 11/29/2019  OT End of Session - 11/30/19 1929    Visit Number  16    Number of Visits  24    Date for OT Re-Evaluation  12/14/19    OT Start Time  1345    OT Stop Time  1435    OT Time Calculation (min)  50 min    Activity Tolerance  Patient tolerated treatment well    Behavior During Therapy  [facility] for tasks assessed/performed       Past Medical History:  Diagnosis Date  . Arthritis   . BPH (benign prostatic hyperplasia)   . Chronic kidney disease    had a kidney stone which per family was a cyst that was removed  . Dysrhythmia   . GERD (gastroesophageal reflux disease)   . History of kidney stones   . Hyperlipidemia   . Hypertension   . Splenic vein thrombosis   . Stroke ([place]) 05/22/2019  . Thrombosis 12/2015   mural  area and no notation of heart attack    Past Surgical History:  Procedure Laterality Date  . APPENDECTOMY    . CATARACT EXTRACTION, BILATERAL    . CYSTOSCOPY WITH INSERTION OF UROLIFT    . KIDNEY STONE SURGERY    . KNEE SURGERY    . PACEMAKER INSERTION N/A 07/13/2018   Procedure: INSERTION PACEMAKER-DUEL CHAMBER INITIAL IMPLANT;  Surgeon: Isaias Cowman, MD;  Location: [facility] ORS;  Service: Cardiovascular;  Laterality: N/A;  . SPINE SURGERY      There were no vitals filed for this visit.  Subjective Assessment - 11/29/19 1350    Subjective   Patient reports some pain in the left knee especially with turning.    Pertinent History  Pt. is a 85 y.o. male who was admitted to [facility] with a [facility] DIstribution Infarct on 05/21/2019. Pt. went through Inpatient Rehab, and had home health OT, PT, and ST services. Pt. was  previously residing home alone, and was independent with ADLs, and IADLs. Pt. now has family staying with the pt. around the clock.    Patient Stated Goals  to regain functional indpendence safely    Currently in Pain?  Yes    Pain Location  Knee    Pain Orientation  Left    Pain Descriptors / Indicators  Sore       Therapeutic exercise:  Patient seen for strengthening with use of 2# wrist weight with reaching to place washers on hooks, pt has to hold arm in elevated plane of motion while attempting to perform fine motor coordination task of placing washers onto hooks and also with removing washers, one at a time with moving items to palm for storage.  Patient has difficulty with holding more than 3 at a time while reaching to get another one.  Neuromuscular Reeducation:  Patient seen for manipulation of [place] discs placing into grid with left hand then working towards flipping to opposite side with emphasis on speed and dexterity.  Response to tx:   Patient was perseverating on his appointment for his hearing aids and had difficulty shifting his attention at times to the task.  He continues to progress in all areas, fatigue with use of 2#  wrist weights for reaching tasks.  Short rest breaks occasionally.  Patient requires cues for prehension patterns with manipulation of small and medium sized washers as well as with [place] discs.  Some difficulty with translatory movements of the hand and using the hand for storage while attempting to obtain the next item without dropping any from palm of left hand.  Continue OT to improve skills to facilitate greater independence in daily tasks at home and in the community.                          OT Long Term Goals - 11/03/19 2032      OT LONG TERM GOAL #1   Title  Pt. will demonstrate home management tasks with supervision initating safe strategies    Baseline  Eval: Decreased safety awareness    Time  12    Period  Weeks     Status  On-going    Target Date  12/14/19      OT LONG TERM GOAL #2   Title  Pt. will be able to identify 100% of potential safety hazards in a simulated home environment during ADLs, and IADLs.    Baseline  Eval: Limited safety awareness    Time  12    Period  Weeks    Status  Partially Met    Target Date  12/14/19      OT LONG TERM GOAL #3   Title  Pt. will increase left grip strength by 5# of force to assist with holding items duirng ADLs, and IADLs.    Baseline  Eval: decreased left grip strength    Time  12    Period  Weeks    Status  On-going    Target Date  12/14/19      OT LONG TERM GOAL #4   Title  Pt. will improve left hand [facility] skills by 3 sec of speed to be able to manipulate objects during ADLs, and IADL tasks.    Baseline  Eval: Pt. has difficulty    Time  12    Period  Weeks    Status  On-going    Target Date  12/14/19      OT LONG TERM GOAL #5   Title  Pt. will demonstrate visual compensatory strategies as needed during ADLs, and IADL tasks.    Baseline  Eval: Limited left sided awareness    Time  12    Period  Weeks    Status  On-going    Target Date  12/14/19      OT LONG TERM GOAL #6   Title  Pt. will demonstrate cognitive compensatory strategies during ADLs, and IADLs    Baseline  Eval: Limited cognition/problem solving, impulsive    Time  12    Period  Weeks    Status  On-going    Target Date  12/14/19            Plan - 11/30/19 1929    Clinical Impression Statement  Patient was perseverating on his appointment for his hearing aids and had difficulty shifting his attention at times to the task.  He continues to progress in all areas, fatigue with use of 2# wrist weights for reaching tasks.  Short rest breaks occasionally.  Patient requires cues for prehension patterns with manipulation of small and medium sized washers as well as with [place] discs.  Some difficulty with translatory movements of the hand and using the  hand for storage while  attempting to obtain the next item without dropping any from palm of left hand.  Continue OT to improve skills to facilitate greater independence in daily tasks at home and in the community.    OT Occupational Profile and History  Detailed Assessment- Review of Records and additional review of physical, cognitive, psychosocial history related to current functional performance    Occupational performance deficits (Please refer to evaluation for details):  ADL's;IADL's    Body Structure / Function / Physical Skills  ADL;FMC;ROM;IADL;Strength;Decreased knowledge of use of DME;UE functional use    Clinical Decision Making  Several treatment options, min-mod task modification necessary    Comorbidities Affecting Occupational Performance:  May have comorbidities impacting occupational performance    OT Frequency  2x / week    OT Duration  12 weeks    OT Treatment/Interventions  Self-care/ADL training;DME and/or AE instruction;Therapeutic exercise;Neuromuscular education;Patient/family education;Therapeutic activities    Consulted and Agree with Plan of Care  Patient       Patient will benefit from skilled therapeutic intervention in order to improve the following deficits and impairments:   Body Structure / Function / Physical Skills: ADL, FMC, ROM, IADL, Strength, Decreased knowledge of use of DME, UE functional use       Visit Diagnosis: Muscle weakness (generalized)  Other lack of coordination    Problem List Patient Active Problem List   Diagnosis Date Noted  . 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 Great toe pain, right   . Lethargy   . CKD (chronic kidney disease), stage II   . Leukocytosis   . Benign essential HTN   . New onset atrial fibrillation ([place])   . Dysphagia, post-stroke   . 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 Acute ischemic right MCA stroke ([place]) 05/25/2019  . Goals of care, counseling/discussion   . Palliative care by specialist   .  DNR (do not resuscitate) discussion   . Stroke, acute, embolic ([place]) 84/16/6063  . Persistent proteinuria 07/30/2018  . 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 Chronic kidney disease, stage III (moderate) 06/15/2018  . Leg pain 05/06/2018  . Lymphedema 05/06/2018  . Coagulopathy ([place]) 07/07/2017  . Lumbar spondylosis 03/10/2017  . Elevated uric acid in blood 11/20/2016  . BPH (benign prostatic hyperplasia) 05/29/2016  . Peripheral vascular disease of lower extremity ([place]) 03/03/2016  . Gallstone 12/31/2015  . 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 Atherosclerosis of aorta ([place]) 11/22/2015  . Carotid artery narrowing 11/22/2015  . Diverticulosis of colon 11/22/2015  . Decreased creatinine clearance 11/22/2015  . Arthritis, degenerative 11/22/2015  . Lactose intolerance 11/22/2015  . Basal cell carcinoma 11/22/2015  . Essential hypertension 06/21/2015  . Hyperlipemia 06/21/2015  . GERD (gastroesophageal reflux disease) 06/21/2015  . Calculus of kidney 11/18/2013   Achilles Dunk, OTR/L, CLT  Renesmae Donahey 11/30/2019, 7:31 PM  [place] MAIN [facility] SERVICES [address] [place], [place], [address] Phone: [phone]   Fax:  [phone]  Name: Jose Castro MRN: [id number] Date of Birth: 28-Oct-1926

## 2019-12-01 ENCOUNTER — Encounter: Payer: Self-pay | Admitting: Occupational Therapy

## 2019-12-01 ENCOUNTER — Ambulatory Visit: Payer: Medicare Other | Admitting: Occupational Therapy

## 2019-12-01 ENCOUNTER — Ambulatory Visit: Payer: Medicare Other

## 2019-12-01 ENCOUNTER — Encounter: Payer: Medicare Other | Admitting: Occupational Therapy

## 2019-12-01 ENCOUNTER — Other Ambulatory Visit: Payer: Self-pay

## 2019-12-01 DIAGNOSIS — M6281 Muscle weakness (generalized): Secondary | ICD-10-CM

## 2019-12-01 DIAGNOSIS — R2681 Unsteadiness on feet: Secondary | ICD-10-CM

## 2019-12-01 DIAGNOSIS — R278 Other lack of coordination: Secondary | ICD-10-CM

## 2019-12-01 NOTE — Therapy (Signed)
[place] MAIN [facility] SERVICES [address] [place], [place], [address] Phone: [phone]   Fax:  [phone]  Occupational Therapy Treatment  Patient Details  Name: Jose Castro MRN: [id number] Date of Birth: Jan 07, 1926 Referring Provider (OT): Steele Sizer   Encounter Date: 12/01/2019  OT End of Session - 12/01/19 1504    Visit Number  17    Number of Visits  24    Date for OT Re-Evaluation  12/14/19    OT Start Time  1303    OT Stop Time  1345    OT Time Calculation (min)  42 min    Activity Tolerance  Patient tolerated treatment well    Behavior During Therapy  [facility] for tasks assessed/performed       Past Medical History:  Diagnosis Date  . Arthritis   . BPH (benign prostatic hyperplasia)   . Chronic kidney disease    had a kidney stone which per family was a cyst that was removed  . Dysrhythmia   . GERD (gastroesophageal reflux disease)   . History of kidney stones   . Hyperlipidemia   . Hypertension   . Splenic vein thrombosis   . Stroke ([place]) 05/22/2019  . Thrombosis 12/2015   mural  area and no notation of heart attack    Past Surgical History:  Procedure Laterality Date  . APPENDECTOMY    . CATARACT EXTRACTION, BILATERAL    . CYSTOSCOPY WITH INSERTION OF UROLIFT    . KIDNEY STONE SURGERY    . KNEE SURGERY    . PACEMAKER INSERTION N/A 07/13/2018   Procedure: INSERTION PACEMAKER-DUEL CHAMBER INITIAL IMPLANT;  Surgeon: Isaias Cowman, MD;  Location: [facility] ORS;  Service: Cardiovascular;  Laterality: N/A;  . SPINE SURGERY      There were no vitals filed for this visit.  Subjective Assessment - 12/01/19 1503    Subjective   Patient reports some pain in the left knee especially with turning.    Pertinent History  Pt. is a 85 y.o. male who was admitted to [facility] with a [facility] DIstribution Infarct on 05/21/2019. Pt. went through Inpatient Rehab, and had home health OT, PT, and ST services. Pt. was  previously residing home alone, and was independent with ADLs, and IADLs. Pt. now has family staying with the pt. around the clock.    Currently in Pain?  Yes      OT TREATMENT    Neuro muscular re-education:  Pt. worked on using his left hand for grasping, and manipulating 1/2" washers from a magnetic dish using a 2pt. Pinch grasp. Pt. worked on reaching up, stabilizing, and sustaining shoulder elevation while placing the washer over a small precise target on vertical dowels positioned at various angles. The board was placed at increments to the far left.   Therapeutic Exercise:  Pt. worked on the [employer] for 8 min. with constant monitoring of the BUEs. Pt. worked on level 4.5, and tolerated it well.  Pt. performed gross gripping with grip strengthener. Pt. worked on sustaining grip while grasping pegs and reaching at various heights to the left, and far left. Pt. performed 2 sets. The Gripper was set at 23.4# of grip strength force.  Response to Treatment:  Pt. continues to make progress overall, and is tolerating UE ther. ex well. Pt. presented with increased accuracy for reaching, and [facility] tasks positioned on a tabletop at the far left. Pt. was able to complete this accurately. Pt. continues to require  verbal cues for safety with the walker when turning in preparation for sitting secondary to limited attention during dual tasking, and when talking.                       OT Education - 12/01/19 1504    Education Details  strengthening    Person(s) Educated  Patient    Methods  Explanation;Demonstration    Comprehension  Verbalized understanding;Returned demonstration;Verbal cues required          OT Long Term Goals - 11/03/19 2032      OT LONG TERM GOAL #1   Title  Pt. will demonstrate home management tasks with supervision initating safe strategies    Baseline  Eval: Decreased safety awareness    Time  12    Period  Weeks    Status  On-going    Target Date   12/14/19      OT LONG TERM GOAL #2   Title  Pt. will be able to identify 100% of potential safety hazards in a simulated home environment during ADLs, and IADLs.    Baseline  Eval: Limited safety awareness    Time  12    Period  Weeks    Status  Partially Met    Target Date  12/14/19      OT LONG TERM GOAL #3   Title  Pt. will increase left grip strength by 5# of force to assist with holding items duirng ADLs, and IADLs.    Baseline  Eval: decreased left grip strength    Time  12    Period  Weeks    Status  On-going    Target Date  12/14/19      OT LONG TERM GOAL #4   Title  Pt. will improve left hand [facility] skills by 3 sec of speed to be able to manipulate objects during ADLs, and IADL tasks.    Baseline  Eval: Pt. has difficulty    Time  12    Period  Weeks    Status  On-going    Target Date  12/14/19      OT LONG TERM GOAL #5   Title  Pt. will demonstrate visual compensatory strategies as needed during ADLs, and IADL tasks.    Baseline  Eval: Limited left sided awareness    Time  12    Period  Weeks    Status  On-going    Target Date  12/14/19      OT LONG TERM GOAL #6   Title  Pt. will demonstrate cognitive compensatory strategies during ADLs, and IADLs    Baseline  Eval: Limited cognition/problem solving, impulsive    Time  12    Period  Weeks    Status  On-going    Target Date  12/14/19            Plan - 12/01/19 1504    Clinical Impression Statement Pt. continues to make progress overall, and is tolerating UE ther. ex well. Pt. presented with increased accuracy for reaching, and [facility] tasks positioned on a tabletop at the far left. Pt. was able to complete this accurately. Pt. continues to require verbal cues for safety with the walker when turning in preparation for sitting secondary to limited attention during dual tasking, and when talking.   OT Occupational Profile and History  Detailed Assessment- Review of Records and additional review of physical,  cognitive, psychosocial history related to current functional performance  Occupational performance deficits (Please refer to evaluation for details):  ADL's;IADL's    Body Structure / Function / Physical Skills  ADL;FMC;ROM;IADL;Strength;Decreased knowledge of use of DME;UE functional use    Rehab Potential  Excellent    Clinical Decision Making  Several treatment options, min-mod task modification necessary    Comorbidities Affecting Occupational Performance:  May have comorbidities impacting occupational performance    Modification or Assistance to Complete Evaluation   Min-Moderate modification of tasks or assist with assess necessary to complete eval    OT Frequency  2x / week    OT Duration  12 weeks    OT Treatment/Interventions  Self-care/ADL training;DME and/or AE instruction;Therapeutic exercise;Neuromuscular education;Patient/family education;Therapeutic activities    Consulted and Agree with Plan of Care  Patient       Patient will benefit from skilled therapeutic intervention in order to improve the following deficits and impairments:   Body Structure / Function / Physical Skills: ADL, FMC, ROM, IADL, Strength, Decreased knowledge of use of DME, UE functional use       Visit Diagnosis: Muscle weakness (generalized)  Other lack of coordination    Problem List Patient Active Problem List   Diagnosis Date Noted  . 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 Great toe pain, right   . Lethargy   . CKD (chronic kidney disease), stage II   . Leukocytosis   . Benign essential HTN   . New onset atrial fibrillation ([place])   . Dysphagia, post-stroke   . 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 Acute ischemic right MCA stroke ([place]) 05/25/2019  . Goals of care, counseling/discussion   . Palliative care by specialist   . DNR (do not resuscitate) discussion   . Stroke, acute, embolic ([place]) 06/77/0340  . Persistent proteinuria 07/30/2018  . Mobitz type 2  second degree heart block 07/13/2018  . Chronic kidney disease, stage III (moderate) 06/15/2018  . Leg pain 05/06/2018  . Lymphedema 05/06/2018  . Coagulopathy ([place]) 07/07/2017  . Lumbar spondylosis 03/10/2017  . Elevated uric acid in blood 11/20/2016  . BPH (benign prostatic hyperplasia) 05/29/2016  . Peripheral vascular disease of lower extremity ([place]) 03/03/2016  . Gallstone 12/31/2015  . 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 Atherosclerosis of aorta ([place]) 11/22/2015  . Carotid artery narrowing 11/22/2015  . Diverticulosis of colon 11/22/2015  . Decreased creatinine clearance 11/22/2015  . Arthritis, degenerative 11/22/2015  . Lactose intolerance 11/22/2015  . Basal cell carcinoma 11/22/2015  . Essential hypertension 06/21/2015  . Hyperlipemia 06/21/2015  . GERD (gastroesophageal reflux disease) 06/21/2015  . Calculus of kidney 11/18/2013    Harrel Carina, MS, OTR/L 12/01/2019, 3:15 PM  [place] MAIN [facility] SERVICES [address] [place], [place], [address] Phone: [phone]   Fax:  [phone]  Name: Jose Castro MRN: [id number] Date of Birth: 1926/04/29

## 2019-12-01 NOTE — Therapy (Signed)
[place] MAIN [facility] SERVICES [address] [place], [place], [address] Phone: [phone]   Fax:  [phone]  Physical Therapy Treatment  Patient Details  Name: Jose Castro MRN: [id number] Date of Birth: [birth year] Referring Provider (PT): Dr. Letta Pate   Encounter Date: 12/01/2019  PT End of Session - 12/01/19 1740    Visit Number  17    Number of Visits  25    Date for PT Re-Evaluation  12/14/19    PT Start Time  6195    PT Stop Time  1430    PT Time Calculation (min)  45 min    Equipment Utilized During Treatment  Gait belt    Activity Tolerance  Patient tolerated treatment well    Behavior During Therapy  [facility] for tasks assessed/performed       Past Medical History:  Diagnosis Date  . Arthritis   . BPH (benign prostatic hyperplasia)   . Chronic kidney disease    had a kidney stone which per family was a cyst that was removed  . Dysrhythmia   . GERD (gastroesophageal reflux disease)   . History of kidney stones   . Hyperlipidemia   . Hypertension   . Splenic vein thrombosis   . Stroke ([place]) 05/22/2019  . Thrombosis 12/2015   mural  area and no notation of heart attack    Past Surgical History:  Procedure Laterality Date  . APPENDECTOMY    . CATARACT EXTRACTION, BILATERAL    . CYSTOSCOPY WITH INSERTION OF UROLIFT    . KIDNEY STONE SURGERY    . KNEE SURGERY    . PACEMAKER INSERTION N/A 07/13/2018   Procedure: INSERTION PACEMAKER-DUEL CHAMBER INITIAL IMPLANT;  Surgeon: Isaias Cowman, MD;  Location: [facility] ORS;  Service: Cardiovascular;  Laterality: N/A;  . SPINE SURGERY      There were no vitals filed for this visit.  Subjective Assessment - 12/01/19 1740    Subjective  Pt reports he is doing well today. He denies any resting pain upon arrival today. Denies falls since last visit. No changes in health or medications since last visit. No specific questions or concerns.    Pertinent History  Pt is a 85 year old male  who presents with imbalance and difficulty with gait following a right frontal CVA with left hemiparesis on 05/22/19.  He has completed inpatient and home health PT, and now presents for OP PT.  He was discharged from inpatient rehab at a supervision level of assistance, ambulating with a RW.  His family notes that he is impulsive and will try to ambulate without his RW at home.  Family is currently providing 24/7 supervision.    How long can you sit comfortably?  no limitations    How long can you walk comfortably?  he requires a RW at home    Patient Stated Goals  get back to normal; want to be able to play golf and go out to play trivia again.  His daughters want him to regain independence with going to the bathroom at night    Currently in Pain?  No/denies       TREATMENT   Therapeutic Exercise xRide L4x 5 minutes for warm-up during history, resistance adjusted to match fatigue and intensity (3 minutes unbilled); Matrix resisted gait with 7.5# x 2 R lateral, x 1 forward, pt becomes exceedingly fearful and anxious that he is going to fall when walking backwards and refuses to perform any more resisted gait; TRX squats  with chair taps 2 x 15;    Neuromuscular Reeducation Airex 6" alternating step taps x 10 each; Airex feet apart and then together with eyes open x 60s each; Airex feet apart and then together with eyes closed x 60s each, pt with frequent LOB when performing requiring eyes to be opened to regain balance before starting again; Airex feet apart with horizontal and then vertical head turns x 60s each; Airex feet together with horizontal and then vertical head turns x 60s each; Sidestepping in // bars without UE support x 4 lengths; Forward/backwards stepping in // bars without UE support x 6 lengths;   Pt educated throughout session about proper posture and technique with exercises. Improved exercise technique, movement at target joints, use of target muscles after min  to mod verbal, visual, tactile cues.   Pt demonstrated increased fear of falling and anxiety during today's session. He seems mildly agitated and continues to express anxiety regarding last session where he felt like he almost feel while performing backwards walking while dribbling the ball. He is unwilling to continue with resisted gait due to his anxiety regarding falling with walking backwards however he will perform backwards gait in the parallel bars.He is able to continue with his strengthening exercises today including TRX squats. Will continue to attempt to incorporate cognitive multitasking into his exercises during following sessions.Hewill benefit from skilled PT services to address deficits in balance and decrease risk for future falls.                          PT Short Term Goals - 11/03/19 1609      PT SHORT TERM GOAL #1   Title  Pt will be independent with HEP in order to improve strength and balance in order to decrease fall risk and improve function at home and work.    Time  6    Period  Weeks    Status  On-going    Target Date  11/02/19        PT Long Term Goals - 11/03/19 1609      PT LONG TERM GOAL #1   Title  Pt will improve BERG to >50 in order to decrease his fall risk.    Baseline  09/21/19: 42/56, 11/03/19: 47/56    Time  12    Period  Weeks    Status  Revised    Target Date  12/14/19      PT LONG TERM GOAL #2   Title  Pt will decrease TUG to below 14 seconds/decrease in order to demonstrate decreased fall risk.    Baseline  09/21/19: 22.19s; 11/03/19: 19.0s    Time  12    Period  Weeks    Status  Partially Met    Target Date  12/14/19      PT LONG TERM GOAL #3   Title  Pt will decrease 5TSTS by at least 3 seconds in order to demonstrate clinically significant improvement in LE strength.    Baseline  09/21/19: 32.28s; 11/03/19: 15.5s    Time  12    Period  Weeks    Status  Achieved      PT LONG TERM GOAL #4   Title  Pt  will increase his 10MWT by at least 0.13 m/s in order to demonstrate a clinically significant improvement in gait speed.    Baseline  09/21/19: Self-selected: 20.9 s = 0.48 m/s; Fastest: 17.1s =0.58 m/s; 11/03/19: Self-selected: 17.7s  s = 0.56 m/s; Fastest: 13.5s =0.74 m/s with walker    Time  12    Period  Weeks    Status  Partially Met    Target Date  12/14/19            Plan - 12/01/19 1741    Clinical Impression Statement  Pt demonstrated increased fear of falling and anxiety during today's session. He seems mildly agitated and continues to express anxiety regarding last session where he felt like he almost feel while performing backwards walking while dribbling the ball. He is unwilling to continue with resisted gait due to his anxiety regarding falling with walking backwards however he will perform backwards gait in the parallel bars. He is able to continue with his strengthening exercises today including TRX squats. Will continue to attempt to incorporate cognitive multitasking into his exercises during following sessions. He will benefit from skilled PT services to address deficits in balance and decrease risk for future falls.    Personal Factors and Comorbidities  Age;Comorbidity 2    Comorbidities  HTN, CVA    Examination-Activity Limitations  Bend;Lift;Squat;Locomotion Level;Stairs;Stand    Examination-Participation Restrictions  Community Activity;Driving    Stability/Clinical Decision Making  Evolving/Moderate complexity    Rehab Potential  Good    PT Frequency  2x / week    PT Duration  12 weeks    PT Treatment/Interventions  ADLs/Self Care Home Management;Canalith Repostioning;Cryotherapy;Electrical Stimulation;Iontophoresis '4mg'$ /ml Dexamethasone;Moist Heat;Traction;Ultrasound;DME Instruction;Gait training;Stair training;Therapeutic activities;Therapeutic exercise;Functional mobility training;Balance training;Neuromuscular re-education;Patient/family education;Manual  techniques;Dry needling;Vestibular;Joint Manipulations;Spinal Manipulations    PT Next Visit Plan  progress strength and balance    PT Home Exercise Plan  Access Code: [email or web address]    Consulted and Agree with Plan of Care  Patient;Family member/caregiver    Family Member Consulted  Patient's granddaughter       Patient will benefit from skilled therapeutic intervention in order to improve the following deficits and impairments:  Abnormal gait, Decreased balance, Decreased endurance, Decreased mobility, Difficulty walking, Decreased knowledge of precautions, Decreased safety awareness, Decreased activity tolerance, Decreased strength, Hypermobility  Visit Diagnosis: Muscle weakness (generalized)  Unsteadiness on feet     Problem List Patient Active Problem List   Diagnosis Date Noted  . 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 Great toe pain, right   . Lethargy   . CKD (chronic kidney disease), stage II   . Leukocytosis   . Benign essential HTN   . New onset atrial fibrillation ([place])   . Dysphagia, post-stroke   . 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 Acute ischemic right MCA stroke ([place]) 05/25/2019  . Goals of care, counseling/discussion   . Palliative care by specialist   . DNR (do not resuscitate) discussion   . Stroke, acute, embolic ([place]) 12/45/8099  . Persistent proteinuria 07/30/2018  . 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 Chronic kidney disease, stage III (moderate) 06/15/2018  . Leg pain 05/06/2018  . Lymphedema 05/06/2018  . Coagulopathy ([place]) 07/07/2017  . Lumbar spondylosis 03/10/2017  . Elevated uric acid in blood 11/20/2016  . BPH (benign prostatic hyperplasia) 05/29/2016  . Peripheral vascular disease of lower extremity ([place]) 03/03/2016  . Gallstone 12/31/2015  . 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 Atherosclerosis of aorta ([place]) 11/22/2015  . Carotid artery narrowing 11/22/2015  .  Diverticulosis of colon 11/22/2015  . Decreased creatinine clearance 11/22/2015  . Arthritis, degenerative 11/22/2015  . Lactose intolerance 11/22/2015  .  Basal cell carcinoma 11/22/2015  . Essential hypertension 06/21/2015  . Hyperlipemia 06/21/2015  . GERD (gastroesophageal reflux disease) 06/21/2015  . Calculus of kidney 11/18/2013   Phillips Grout PT, DPT, GCS  Jahzeel Poythress 12/01/2019, 5:45 PM  [place] MAIN [facility] SERVICES [address] [place], [place], [address] Phone: [phone]   Fax:  [phone]  Name: Jose Castro MRN: [id number] Date of Birth: 1926/05/10

## 2019-12-05 ENCOUNTER — Encounter (INDEPENDENT_AMBULATORY_CARE_PROVIDER_SITE_OTHER): Payer: Self-pay | Admitting: Vascular Surgery

## 2019-12-05 ENCOUNTER — Ambulatory Visit (INDEPENDENT_AMBULATORY_CARE_PROVIDER_SITE_OTHER): Payer: Medicare Other | Admitting: Vascular Surgery

## 2019-12-05 ENCOUNTER — Other Ambulatory Visit: Payer: Self-pay

## 2019-12-05 ENCOUNTER — Ambulatory Visit (INDEPENDENT_AMBULATORY_CARE_PROVIDER_SITE_OTHER): Payer: Medicare Other

## 2019-12-05 VITALS — BP 123/76 | HR 89 | Resp 16 | Wt 171.8 lb

## 2019-12-05 DIAGNOSIS — I739 Peripheral vascular disease, unspecified: Secondary | ICD-10-CM

## 2019-12-05 DIAGNOSIS — I6523 Occlusion and stenosis of bilateral carotid arteries: Secondary | ICD-10-CM

## 2019-12-05 DIAGNOSIS — I4891 Unspecified atrial fibrillation: Secondary | ICD-10-CM

## 2019-12-05 DIAGNOSIS — I872 Venous insufficiency (chronic) (peripheral): Secondary | ICD-10-CM | POA: Insufficient documentation

## 2019-12-05 DIAGNOSIS — E782 Mixed hyperlipidemia: Secondary | ICD-10-CM

## 2019-12-05 DIAGNOSIS — K219 Gastro-esophageal reflux disease without esophagitis: Secondary | ICD-10-CM

## 2019-12-05 NOTE — Progress Notes (Signed)
MRN : [id number]  Jose Castro is a 86 y.o. (1926-04-10) male who presents with chief complaint of  Chief Complaint  Patient presents with  . Follow-up    ultrasound follow up  .  History of Present Illness:   The patient returns to the office for followup and review of the noninvasive studies. There have been no interval changes in lower extremity symptoms. No interval shortening of the patient's claudication distance or development of rest pain symptoms. No new ulcers or wounds have occurred since the last visit.  His swelling is stable and he has been wearing graduated compression on a routine basis.  Interestingly, this in association with frequent elevation during the day has greatly reduced the number of times he is getting up at night to urinate  There has been a significant change to the patient's overall health care.  There has been an interval stroke associated with atrial fibrillation.  The patient denies amaurosis fugax or recent TIA symptoms. There are no recent neurological changes noted. The patient denies history of DVT, PE or superficial thrombophlebitis. The patient denies recent episodes of angina or shortness of breath.   ABI's Rt=1.17 and Lt=0.76 (previous ABI's  Rt=1.03 and Lt=1.00)    Current Meds  Medication Sig  . acetaminophen (TYLENOL) 325 MG tablet Take 2 tablets (650 mg total) by mouth every 4 (four) hours as needed for mild pain (or temp > 37.5 C (99.5 F)).  Marland Kitchen apixaban (ELIQUIS) 2.5 MG TABS tablet Take 1 tablet (2.5 mg total) by mouth 2 (two) times daily.  . Cholecalciferol (VITAMIN D3) 50 MCG (2000 UT) TABS Take 2,000 Units by mouth daily.  . finasteride (PROSCAR) 5 MG tablet Take 1 tablet (5 mg total) by mouth daily.  . metoprolol succinate (TOPROL XL) 25 MG 24 hr tablet Take 1 tablet (25 mg total) by mouth daily.  . Omega-3 Fatty Acids (FISH OIL) 1000 MG CAPS Take 1,000 mg by mouth daily.   . pantoprazole (PROTONIX) 40 MG tablet Take 1 tablet  (40 mg total) by mouth daily.  . rosuvastatin (CRESTOR) 40 MG tablet Take 1 tablet (40 mg total) by mouth daily at 6 PM.    Past Medical History:  Diagnosis Date  . Arthritis   . BPH (benign prostatic hyperplasia)   . Chronic kidney disease    had a kidney stone which per family was a cyst that was removed  . Dysrhythmia   . GERD (gastroesophageal reflux disease)   . History of kidney stones   . Hyperlipidemia   . Hypertension   . Splenic vein thrombosis   . Stroke ([place]) 05/22/2019  . Thrombosis 12/2015   mural  area and no notation of heart attack    Past Surgical History:  Procedure Laterality Date  . APPENDECTOMY    . CATARACT EXTRACTION, BILATERAL    . CYSTOSCOPY WITH INSERTION OF UROLIFT    . KIDNEY STONE SURGERY    . KNEE SURGERY    . PACEMAKER INSERTION N/A 07/13/2018   Procedure: INSERTION PACEMAKER-DUEL CHAMBER INITIAL IMPLANT;  Surgeon: Isaias Cowman, MD;  Location: [facility] ORS;  Service: Cardiovascular;  Laterality: N/A;  . SPINE SURGERY      Social History Social History   Tobacco Use  . Smoking status: Former Smoker    Years: 16.00    Types: Cigarettes    Start date: 10/21/1944    Quit date: 10/21/1960    Years since quitting: 59.1  . Smokeless tobacco: Never Used  Substance Use Topics  . Alcohol use: Yes    Alcohol/week: 3.0 standard drinks    Types: 3 Standard drinks or equivalent per week    Comment: once a week when he goes out to dinner-socially  . Drug use: No    Family History Family History  Problem Relation Age of Onset  . Cancer Brother        bladder cancer with mets  . Heart disease Mother   . Aortic aneurysm Mother   . Heart attack Maternal Aunt   . Heart attack Maternal Uncle     Allergies  Allergen Reactions  . Penicillins Itching and Swelling    Has patient had a PCN reaction causing immediate rash, facial/tongue/throat swelling, SOB or lightheadedness with hypotension: No Has patient had a PCN reaction causing severe  rash involving mucus membranes or skin necrosis: No Has patient had a PCN reaction that required hospitalization: No Has patient had a PCN reaction occurring within the last 10 years: No If all of the above answers are "NO", then may proceed with Cephalosporin use.      REVIEW OF SYSTEMS (Negative unless checked)  Constitutional: [] Weight loss  [] Fever  [] Chills Cardiac: [] Chest pain   [] Chest pressure   [] Palpitations   [] Shortness of breath when laying flat   [] Shortness of breath with exertion. Vascular:  [x] Pain in legs with walking   [] Pain in legs at rest  [] History of DVT   [] Phlebitis   [x] Swelling in legs   [] Varicose veins   [] Non-healing ulcers Pulmonary:   [] Uses home oxygen   [] Productive cough   [] Hemoptysis   [] Wheeze  [] COPD   [] Asthma Neurologic:  [] Dizziness   [] Seizures   [x] History of stroke   [] History of TIA  [] Aphasia   [] Vissual changes   [] Weakness or numbness in arm   [] Weakness or numbness in leg Musculoskeletal:   [] Joint swelling   [x] Joint pain   [] Low back pain Hematologic:  [] Easy bruising  [] Easy bleeding   [] Hypercoagulable state   [] Anemic Gastrointestinal:  [] Diarrhea   [] Vomiting  [x] Gastroesophageal reflux/heartburn   [] Difficulty swallowing. Genitourinary:  [] Chronic kidney disease   [] Difficult urination  [] Frequent urination   [] Blood in urine Skin:  [] Rashes   [] Ulcers  Psychological:  [] History of anxiety   []  History of major depression.  Physical Examination  Vitals:   12/05/19 1104  BP: 123/76  Pulse: 89  Resp: 16  Weight: 171 lb 12.8 oz (77.9 kg)   Body mass index is 24.3 kg/m. Gen: WD/WN, NAD Head: [place]/AT, No temporalis wasting.  Ear/Nose/Throat: Hearing grossly intact, nares w/o erythema or drainage Eyes: PER, EOMI, sclera nonicteric.  Neck: Supple, no large masses.   Pulmonary:  Good air movement, no audible wheezing bilaterally, no use of accessory muscles.  Cardiac: RRR, no JVD Vascular: scattered varicosities present  bilaterally.  Mild to moderate venous stasis changes to the legs bilaterally.  2+ soft pitting edema Vessel Right Left  PT Palpable Not Palpable  DP Palpable Not Palpable  Gastrointestinal: Non-distended. No guarding/no peritoneal signs.  Musculoskeletal: M/S 5/5 throughout.  No deformity or atrophy.  Neurologic: CN 2-12 intact. Symmetrical.  Speech is fluent. Motor exam as listed above. Psychiatric: Judgment intact, Mood & affect appropriate for pt's clinical situation. Dermatologic: No rashes or ulcers noted.  No changes consistent with cellulitis. Lymph : No lichenification or skin changes of chronic lymphedema.  CBC Lab Results  Component Value Date   WBC 10.4 06/06/2019   HGB 15.3 06/06/2019  HCT 47.0 06/06/2019   MCV 90.4 06/06/2019   PLT 486 (H) 06/06/2019    BMET    Component Value Date/Time   NA 139 06/06/2019 1039   NA 144 11/19/2016 0955   NA 138 11/27/2013 0819   K 4.5 06/06/2019 1039   K 4.1 11/27/2013 0819   CL 103 06/06/2019 1039   CL 107 11/27/2013 0819   CO2 26 06/06/2019 1039   CO2 23 11/27/2013 0819   GLUCOSE 72 06/06/2019 1039   GLUCOSE 101 (H) 11/27/2013 0819   BUN 28 (H) 06/06/2019 1039   BUN 25 11/19/2016 0955   BUN 22 (H) 11/27/2013 0819   CREATININE 1.28 (H) 06/06/2019 1039   CREATININE 1.30 (H) 07/16/2018 1551   CALCIUM 9.1 06/06/2019 1039   CALCIUM 8.2 (L) 11/27/2013 0819   GFRNONAA 49 (L) 06/06/2019 1039   GFRNONAA 48 (L) 07/16/2018 1551   GFRAA 57 (L) 06/06/2019 1039   GFRAA 56 (L) 07/16/2018 1551   CrCl cannot be calculated (Patient's most recent lab result is older than the maximum 21 days allowed.).  COAG Lab Results  Component Value Date   INR 1.1 05/21/2019   INR 1.04 07/06/2018   INR 1.1 12/17/2015   INR 1.1 12/17/2015    Radiology No results found.    Assessment/Plan 1. Peripheral vascular disease of lower extremity (HCC)  Recommend:  The patient has evidence of atherosclerosis of the lower extremities with  claudication.  The patient does not voice lifestyle limiting changes at this point in time.  Noninvasive studies do not suggest clinically significant change.  No invasive studies, angiography or surgery at this time The patient should continue walking and begin a more formal exercise program.  The patient should continue antiplatelet therapy and aggressive treatment of the lipid abnormalities  No changes in the patient's medications at this time  The patient should continue wearing graduated compression socks 10-15 mmHg strength to control the mild edema.   - ABI; Future  2. Chronic venous insufficiency Continue graduated compression and elevation during the day  3. Bilateral carotid artery stenosis The patient's stroke was associated with atrial fibrillation.  He has moderate carotid stenosis.  Given his age we will follow his carotid artery stenosis clinically.  No invasive testing or surgery indicated at this time  4. New onset atrial fibrillation (HCC) Continue antiarrhythmia medications as already ordered, these medications have been reviewed and there are no changes at this time.  Continue anticoagulation as ordered by Cardiology Service   5. Gastroesophageal reflux disease without esophagitis Continue PPI as already ordered, this medication has been reviewed and there are no changes at this time.  Avoidence of caffeine and alcohol  Moderate elevation of the head of the bed   6. Mixed hyperlipidemia Continue statin as ordered and reviewed, no changes at this time    Hortencia Pilar, MD  12/05/2019 11:14 AM

## 2019-12-06 ENCOUNTER — Encounter: Payer: Self-pay | Admitting: Occupational Therapy

## 2019-12-06 ENCOUNTER — Ambulatory Visit: Payer: Medicare Other | Attending: Physical Medicine & Rehabilitation | Admitting: Occupational Therapy

## 2019-12-06 ENCOUNTER — Ambulatory Visit: Payer: Medicare Other

## 2019-12-06 DIAGNOSIS — R29898 Other symptoms and signs involving the musculoskeletal system: Secondary | ICD-10-CM | POA: Insufficient documentation

## 2019-12-06 DIAGNOSIS — M6281 Muscle weakness (generalized): Secondary | ICD-10-CM

## 2019-12-06 DIAGNOSIS — I63511 Cerebral infarction due to unspecified occlusion or stenosis of right middle cerebral artery: Secondary | ICD-10-CM | POA: Diagnosis present

## 2019-12-06 DIAGNOSIS — R2681 Unsteadiness on feet: Secondary | ICD-10-CM

## 2019-12-06 DIAGNOSIS — R2689 Other abnormalities of gait and mobility: Secondary | ICD-10-CM | POA: Diagnosis present

## 2019-12-06 DIAGNOSIS — R5383 Other fatigue: Secondary | ICD-10-CM | POA: Insufficient documentation

## 2019-12-06 DIAGNOSIS — M79674 Pain in right toe(s): Secondary | ICD-10-CM | POA: Diagnosis present

## 2019-12-06 DIAGNOSIS — R278 Other lack of coordination: Secondary | ICD-10-CM | POA: Insufficient documentation

## 2019-12-06 NOTE — Therapy (Signed)
[place] MAIN [facility] SERVICES [address] [place], [place], [address] Phone: [phone]   Fax:  [phone]  Physical Therapy Treatment  Patient Details  Name: Jose Castro MRN: [id number] Date of Birth: Mar 19, 1926 Referring Provider (PT): Dr. Letta Pate   Encounter Date: 12/06/2019  PT End of Session - 12/06/19 1351    Visit Number  18    Number of Visits  25    Date for PT Re-Evaluation  12/14/19    PT Start Time  1017    PT Stop Time  1430    PT Time Calculation (min)  45 min    Equipment Utilized During Treatment  Gait belt    Activity Tolerance  Patient tolerated treatment well    Behavior During Therapy  [facility] for tasks assessed/performed       Past Medical History:  Diagnosis Date  . Arthritis   . BPH (benign prostatic hyperplasia)   . Chronic kidney disease    had a kidney stone which per family was a cyst that was removed  . Dysrhythmia   . GERD (gastroesophageal reflux disease)   . History of kidney stones   . Hyperlipidemia   . Hypertension   . Splenic vein thrombosis   . Stroke ([place]) 05/22/2019  . Thrombosis 12/2015   mural  area and no notation of heart attack    Past Surgical History:  Procedure Laterality Date  . APPENDECTOMY    . CATARACT EXTRACTION, BILATERAL    . CYSTOSCOPY WITH INSERTION OF UROLIFT    . KIDNEY STONE SURGERY    . KNEE SURGERY    . PACEMAKER INSERTION N/A 07/13/2018   Procedure: INSERTION PACEMAKER-DUEL CHAMBER INITIAL IMPLANT;  Surgeon: Isaias Cowman, MD;  Location: [facility] ORS;  Service: Cardiovascular;  Laterality: N/A;  . SPINE SURGERY      There were no vitals filed for this visit.  Subjective Assessment - 12/06/19 1351    Subjective  Pt reports he is doing well today. He denies any resting pain upon arrival today. Denies falls since last visit. No changes in health or medications since last visit. No specific questions or concerns.    Pertinent History  Pt is a 86 year old male  who presents with imbalance and difficulty with gait following a right frontal CVA with left hemiparesis on 05/22/19.  He has completed inpatient and home health PT, and now presents for OP PT.  He was discharged from inpatient rehab at a supervision level of assistance, ambulating with a RW.  His family notes that he is impulsive and will try to ambulate without his RW at home.  Family is currently providing 24/7 supervision.    How long can you sit comfortably?  no limitations    How long can you walk comfortably?  he requires a RW at home    Patient Stated Goals  get back to normal; want to be able to play golf and go out to play trivia again.  His daughters want him to regain independence with going to the bathroom at night    Currently in Pain?  No/denies          TREATMENT   Therapeutic Exercise xRide L4x 5 minutes for warm-up during history, resistance adjusted to match fatigue and intensity (3 minutes unbilled); Quantum R leg press 75# 2 x 20; Attempted quantum heel raises however too challenging for patient; Standing exercises with 3# ankle weights: Hip flexion marching x 15 bilateral; Hip abduction x 15 bilateral;  HS curls x 15 bilateral; Hip extension x 15 bilateral; Seated HS step stretch 45s hold x 2 bilateral;   Neuromuscular Reeducation 6" alternating step taps x 10 each; 6" step ups leading with RLE up and LLE down to minimize L knee discomfort; 4 square stepping over canes in all 4 directions CW/CCW once each; Dynamic balance practice gait with single point cane x 100' in rehab gym followed by a seated rest break and then an addition 100' out into hallway;    Pt educated throughout session about proper posture and technique with exercises. Improved exercise technique, movement at target joints, use of target muscles after min to mod verbal, visual, tactile cues.   Pt demonstrates less anxiety regarding falls today compared to previous sessions. He is able  to complete all exercises as instructed by therapist today. Performed step-ups with patient but modified to minimize L knee discomfort. He is able to do some dynamic balance training by practicing gait with single point cane. Will continue to attempt to incorporate cognitive multitasking into his exercises during following sessions.Hewill benefit from skilled PT services to address deficits in balance and decrease risk for future falls.                       PT Short Term Goals - 11/03/19 1609      PT SHORT TERM GOAL #1   Title  Pt will be independent with HEP in order to improve strength and balance in order to decrease fall risk and improve function at home and work.    Time  6    Period  Weeks    Status  On-going    Target Date  11/02/19        PT Long Term Goals - 11/03/19 1609      PT LONG TERM GOAL #1   Title  Pt will improve BERG to >50 in order to decrease his fall risk.    Baseline  09/21/19: 42/56, 11/03/19: 47/56    Time  12    Period  Weeks    Status  Revised    Target Date  12/14/19      PT LONG TERM GOAL #2   Title  Pt will decrease TUG to below 14 seconds/decrease in order to demonstrate decreased fall risk.    Baseline  09/21/19: 22.19s; 11/03/19: 19.0s    Time  12    Period  Weeks    Status  Partially Met    Target Date  12/14/19      PT LONG TERM GOAL #3   Title  Pt will decrease 5TSTS by at least 3 seconds in order to demonstrate clinically significant improvement in LE strength.    Baseline  09/21/19: 32.28s; 11/03/19: 15.5s    Time  12    Period  Weeks    Status  Achieved      PT LONG TERM GOAL #4   Title  Pt will increase his 10MWT by at least 0.13 m/s in order to demonstrate a clinically significant improvement in gait speed.    Baseline  09/21/19: Self-selected: 20.9 s = 0.48 m/s; Fastest: 17.1s =0.58 m/s; 11/03/19: Self-selected: 17.7s s = 0.56 m/s; Fastest: 13.5s =0.74 m/s with walker    Time  12    Period  Weeks    Status   Partially Met    Target Date  12/14/19            Plan - 12/06/19 1351  Clinical Impression Statement  Pt demonstrates less anxiety regarding falls today compared to previous sessions. He is able to complete all exercises as instructed by therapist today. Performed step-ups with patient but modified to minimize L knee discomfort. He is able to do some dynamic balance training by practicing gait with single point cane. Will continue to attempt to incorporate cognitive multitasking into his exercises during following sessions. He will benefit from skilled PT services to address deficits in balance and decrease risk for future falls.    Personal Factors and Comorbidities  Age;Comorbidity 2    Comorbidities  HTN, CVA    Examination-Activity Limitations  Bend;Lift;Squat;Locomotion Level;Stairs;Stand    Examination-Participation Restrictions  Community Activity;Driving    Stability/Clinical Decision Making  Evolving/Moderate complexity    Rehab Potential  Good    PT Frequency  2x / week    PT Duration  12 weeks    PT Treatment/Interventions  ADLs/Self Care Home Management;Canalith Repostioning;Cryotherapy;Electrical Stimulation;Iontophoresis 46m/ml Dexamethasone;Moist Heat;Traction;Ultrasound;DME Instruction;Gait training;Stair training;Therapeutic activities;Therapeutic exercise;Functional mobility training;Balance training;Neuromuscular re-education;Patient/family education;Manual techniques;Dry needling;Vestibular;Joint Manipulations;Spinal Manipulations    PT Next Visit Plan  progress strength and balance    PT Home Exercise Plan  Access Code: [email or web address]   Consulted and Agree with Plan of Care  Patient;Family member/caregiver    Family Member Consulted  Patient's granddaughter       Patient will benefit from skilled therapeutic intervention in order to improve the following deficits and impairments:  Abnormal gait, Decreased balance, Decreased endurance, Decreased mobility, Difficulty  walking, Decreased knowledge of precautions, Decreased safety awareness, Decreased activity tolerance, Decreased strength, Hypermobility  Visit Diagnosis: Muscle weakness (generalized)  Unsteadiness on feet     Problem List Patient Active Problem List   Diagnosis Date Noted  . Chronic venous insufficiency 12/05/2019  . 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 Great toe pain, right   . Lethargy   . CKD (chronic kidney disease), stage II   . Leukocytosis   . Benign essential HTN   . New onset atrial fibrillation ([place]   . Dysphagia, post-stroke   . Right middle cerebral artery stroke ([place] 05/27/2019  . Acute ischemic right MCA stroke ([place] 05/25/2019  . Goals of care, counseling/discussion   . Palliative care by specialist   . DNR (do not resuscitate) discussion   . Stroke, acute, embolic ([place] 076/73/4193 . Persistent proteinuria 07/30/2018  . 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 Chronic kidney disease, stage III (moderate) 06/15/2018  . Leg pain 05/06/2018  . Lymphedema 05/06/2018  . Coagulopathy ([place] 07/07/2017  . Lumbar spondylosis 03/10/2017  . Elevated uric acid in blood 11/20/2016  . BPH (benign prostatic hyperplasia) 05/29/2016  . Peripheral vascular disease of lower extremity ([place] 03/03/2016  . Gallstone 12/31/2015  . 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 Atherosclerosis of aorta ([place] 11/22/2015  . Carotid artery narrowing 11/22/2015  . Diverticulosis of colon 11/22/2015  . Decreased creatinine clearance 11/22/2015  . Arthritis, degenerative 11/22/2015  . Lactose intolerance 11/22/2015  . Basal cell carcinoma 11/22/2015  . Essential hypertension 06/21/2015  . Hyperlipemia 06/21/2015  . GERD (gastroesophageal reflux disease) 06/21/2015  . Calculus of kidney 11/18/2013   JPhillips GroutPT, DPT, GCS  Jaleeah Slight 12/06/2019, 2:42 PM  [facility] [address] [address]Phone: [phone]  Fax:  [phone] Name: DYOUNIS MATHEYMRN: [id number] of Birth: 9March 15, 1927

## 2019-12-06 NOTE — Therapy (Signed)
[place] MAIN [facility] SERVICES [address] [place], [place], [address] Phone: [phone]   Fax:  [phone]  Occupational Therapy Treatment  Patient Details  Name: Jose Castro MRN: [id number] Date of Birth: [birth year] Referring Provider (OT): Steele Sizer   Encounter Date: 12/06/2019  OT End of Session - 12/06/19 1318    Visit Number  18    Number of Visits  24    Date for OT Re-Evaluation  12/14/19    OT Start Time  1300    OT Stop Time  1345    OT Time Calculation (min)  45 min    Activity Tolerance  Patient tolerated treatment well    Behavior During Therapy  [facility] for tasks assessed/performed       Past Medical History:  Diagnosis Date  . Arthritis   . BPH (benign prostatic hyperplasia)   . Chronic kidney disease    had a kidney stone which per family was a cyst that was removed  . Dysrhythmia   . GERD (gastroesophageal reflux disease)   . History of kidney stones   . Hyperlipidemia   . Hypertension   . Splenic vein thrombosis   . Stroke ([place]) 05/22/2019  . Thrombosis 12/2015   mural  area and no notation of heart attack    Past Surgical History:  Procedure Laterality Date  . APPENDECTOMY    . CATARACT EXTRACTION, BILATERAL    . CYSTOSCOPY WITH INSERTION OF UROLIFT    . KIDNEY STONE SURGERY    . KNEE SURGERY    . PACEMAKER INSERTION N/A 07/13/2018   Procedure: INSERTION PACEMAKER-DUEL CHAMBER INITIAL IMPLANT;  Surgeon: Isaias Cowman, MD;  Location: [facility] ORS;  Service: Cardiovascular;  Laterality: N/A;  . SPINE SURGERY      There were no vitals filed for this visit.  Subjective Assessment - 12/06/19 1317    Subjective   Patient reports some pain in the left knee especially with turning.    Pertinent History  Pt. is a 86 y.o. male who was admitted to [facility] with a [facility] DIstribution Infarct on 05/21/2019. Pt. went through Inpatient Rehab, and had home health OT, PT, and ST services. Pt. was  previously residing home alone, and was independent with ADLs, and IADLs. Pt. now has family staying with the pt. around the clock.    Patient Stated Goals  to regain functional indpendence safely    Currently in Pain?  No/denies      OT TREATMENT    Therapeutic Exercise:  Pt. worked on the [employer] for 8 min. with constant monitoring of the BUEs. Pt. worked in the forward position at workload level 4.5.  Pt. performed 5# dowel ex. for UE strengthening secondary to weakness 1 set 20 reps each. Bilateral shoulder flexion, chest press, and circular patterns. 5# dumbbell ex. for elbow flexion and extension, forearm supination/pronation, and 4# for wrist flexion/extension, and radial deviation. Pt. requires rest breaks and verbal cues for proper technique.  Response to Treatment:  Pt. is tolerating UE strengthening exercises well, and has improved with visual scanning to the left, as well as left sided awareness. Pt. continues to work on improving LUE strength, [facility] skills, left sided awareness, and safety awareness, and judgement during ADLs, and IADLs.                      OT Education - 12/06/19 1318    Education Details  strengthening    Person(s)  Educated  Patient    Comprehension  Verbalized understanding;Returned demonstration;Verbal cues required          OT Long Term Goals - 11/03/19 2032      OT LONG TERM GOAL #1   Title  Pt. will demonstrate home management tasks with supervision initating safe strategies    Baseline  Eval: Decreased safety awareness    Time  12    Period  Weeks    Status  On-going    Target Date  12/14/19      OT LONG TERM GOAL #2   Title  Pt. will be able to identify 100% of potential safety hazards in a simulated home environment during ADLs, and IADLs.    Baseline  Eval: Limited safety awareness    Time  12    Period  Weeks    Status  Partially Met    Target Date  12/14/19      OT LONG TERM GOAL #3   Title  Pt. will increase left  grip strength by 5# of force to assist with holding items duirng ADLs, and IADLs.    Baseline  Eval: decreased left grip strength    Time  12    Period  Weeks    Status  On-going    Target Date  12/14/19      OT LONG TERM GOAL #4   Title  Pt. will improve left hand [facility] skills by 3 sec of speed to be able to manipulate objects during ADLs, and IADL tasks.    Baseline  Eval: Pt. has difficulty    Time  12    Period  Weeks    Status  On-going    Target Date  12/14/19      OT LONG TERM GOAL #5   Title  Pt. will demonstrate visual compensatory strategies as needed during ADLs, and IADL tasks.    Baseline  Eval: Limited left sided awareness    Time  12    Period  Weeks    Status  On-going    Target Date  12/14/19      OT LONG TERM GOAL #6   Title  Pt. will demonstrate cognitive compensatory strategies during ADLs, and IADLs    Baseline  Eval: Limited cognition/problem solving, impulsive    Time  12    Period  Weeks    Status  On-going    Target Date  12/14/19            Plan - 12/06/19 1319    Clinical Impression Statement Pt, reports that he has an opportunity to take the COVID vaccine this weekend. Pt. reports that he is not sure he will take it, and is waiting for more information about it. Pt. is tolerating UE strengthening exercises well, and has improved with visual scanning to the left, as well as left sided awareness. Pt. continues to work on improving LUE strength, [facility] skills, left sided awareness, and safety awareness, and judgement during ADLs, and IADLs.    OT Occupational Profile and History  Detailed Assessment- Review of Records and additional review of physical, cognitive, psychosocial history related to current functional performance    Occupational performance deficits (Please refer to evaluation for details):  ADL's;IADL's    Body Structure / Function / Physical Skills  ADL;FMC;ROM;IADL;Strength;Decreased knowledge of use of DME;UE functional use    Rehab  Potential  Excellent    Clinical Decision Making  Several treatment options, min-mod task modification necessary  Comorbidities Affecting Occupational Performance:  May have comorbidities impacting occupational performance    Modification or Assistance to Complete Evaluation   Min-Moderate modification of tasks or assist with assess necessary to complete eval    OT Frequency  2x / week    OT Duration  12 weeks    OT Treatment/Interventions  Self-care/ADL training;DME and/or AE instruction;Therapeutic exercise;Neuromuscular education;Patient/family education;Therapeutic activities    Consulted and Agree with Plan of Care  Patient       Patient will benefit from skilled therapeutic intervention in order to improve the following deficits and impairments:   Body Structure / Function / Physical Skills: ADL, FMC, ROM, IADL, Strength, Decreased knowledge of use of DME, UE functional use       Visit Diagnosis: Muscle weakness (generalized)    Problem List Patient Active Problem List   Diagnosis Date Noted  . Chronic venous insufficiency 12/05/2019  . 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 Great toe pain, right   . Lethargy   . CKD (chronic kidney disease), stage II   . Leukocytosis   . Benign essential HTN   . New onset atrial fibrillation ([place])   . Dysphagia, post-stroke   . Right middle cerebral artery stroke ([place]) 05/27/2019  . Acute ischemic right MCA stroke ([place]) 05/25/2019  . Goals of care, counseling/discussion   . Palliative care by specialist   . DNR (do not resuscitate) discussion   . Stroke, acute, embolic ([place]) 97/01/6377  . Persistent proteinuria 07/30/2018  . 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 Chronic kidney disease, stage III (moderate) 06/15/2018  . Leg pain 05/06/2018  . Lymphedema 05/06/2018  . Coagulopathy ([place]) 07/07/2017  . Lumbar spondylosis 03/10/2017  . Elevated uric acid in blood 11/20/2016  . BPH (benign prostatic  hyperplasia) 05/29/2016  . Peripheral vascular disease of lower extremity ([place]) 03/03/2016  . Gallstone 12/31/2015  . 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 Atherosclerosis of aorta ([place]) 11/22/2015  . Carotid artery narrowing 11/22/2015  . Diverticulosis of colon 11/22/2015  . Decreased creatinine clearance 11/22/2015  . Arthritis, degenerative 11/22/2015  . Lactose intolerance 11/22/2015  . Basal cell carcinoma 11/22/2015  . Essential hypertension 06/21/2015  . Hyperlipemia 06/21/2015  . GERD (gastroesophageal reflux disease) 06/21/2015  . Calculus of kidney 11/18/2013    Harrel Carina, MS, OTR/L 12/06/2019, 1:33 PM  [place] MAIN [facility] SERVICES [address] [place], [place], [address] Phone: [phone]   Fax:  [phone]  Name: Jose Castro MRN: [id number] Date of Birth: 05-15-1926

## 2019-12-08 ENCOUNTER — Other Ambulatory Visit: Payer: Self-pay

## 2019-12-08 ENCOUNTER — Ambulatory Visit: Payer: Medicare Other

## 2019-12-08 ENCOUNTER — Ambulatory Visit: Payer: Medicare Other | Admitting: Occupational Therapy

## 2019-12-08 DIAGNOSIS — R5383 Other fatigue: Secondary | ICD-10-CM

## 2019-12-08 DIAGNOSIS — M6281 Muscle weakness (generalized): Secondary | ICD-10-CM

## 2019-12-08 DIAGNOSIS — R2681 Unsteadiness on feet: Secondary | ICD-10-CM

## 2019-12-08 DIAGNOSIS — R29898 Other symptoms and signs involving the musculoskeletal system: Secondary | ICD-10-CM

## 2019-12-08 DIAGNOSIS — M79674 Pain in right toe(s): Secondary | ICD-10-CM

## 2019-12-08 DIAGNOSIS — R2689 Other abnormalities of gait and mobility: Secondary | ICD-10-CM

## 2019-12-08 DIAGNOSIS — R278 Other lack of coordination: Secondary | ICD-10-CM

## 2019-12-08 DIAGNOSIS — I63511 Cerebral infarction due to unspecified occlusion or stenosis of right middle cerebral artery: Secondary | ICD-10-CM

## 2019-12-08 NOTE — Therapy (Signed)
[place] MAIN [facility] SERVICES [address] [place], [place], [address] Phone: [phone]   Fax:  [phone]  Physical Therapy Treatment  Patient Details  Name: Jose Castro MRN: [id number] Date of Birth: 04-Jun-1926 Referring Provider (PT): Dr. Letta Pate   Encounter Date: 12/08/2019  PT End of Session - 12/08/19 1357    Visit Number  19    Number of Visits  25    Date for PT Re-Evaluation  12/14/19    PT Start Time  1347    PT Stop Time  1425    PT Time Calculation (min)  38 min    Equipment Utilized During Treatment  Gait belt    Activity Tolerance  Patient tolerated treatment well    Behavior During Therapy  [facility] for tasks assessed/performed       Past Medical History:  Diagnosis Date  . Arthritis   . BPH (benign prostatic hyperplasia)   . Chronic kidney disease    had a kidney stone which per family was a cyst that was removed  . Dysrhythmia   . GERD (gastroesophageal reflux disease)   . History of kidney stones   . Hyperlipidemia   . Hypertension   . Splenic vein thrombosis   . Stroke (Jose Castro) 05/22/2019  . Thrombosis 12/2015   mural  area and no notation of heart attack    Past Surgical History:  Procedure Laterality Date  . APPENDECTOMY    . CATARACT EXTRACTION, BILATERAL    . CYSTOSCOPY WITH INSERTION OF UROLIFT    . KIDNEY STONE SURGERY    . KNEE SURGERY    . PACEMAKER INSERTION N/A 07/13/2018   Procedure: INSERTION PACEMAKER-DUEL CHAMBER INITIAL IMPLANT;  Surgeon: Isaias Cowman, MD;  Location: [facility] ORS;  Service: Cardiovascular;  Laterality: N/A;  . SPINE SURGERY      There were no vitals filed for this visit.  Subjective Assessment - 12/08/19 1353    Subjective  Patient reported that he is doing well today. No new complaints no new falls. The patient reported knee pain with certain exercises that resolved at end of reps.    Patient is accompained by:  Family member    Pertinent History  Pt is a 86 year old  male who presents with imbalance and difficulty with gait following a right frontal CVA with left hemiparesis on 05/22/19.  He has completed inpatient and home health PT, and now presents for OP PT.  He was discharged from inpatient rehab at a supervision level of assistance, ambulating with a RW.  His family notes that he is impulsive and will try to ambulate without his RW at home.  Family is currently providing 24/7 supervision.    Limitations  House hold activities;Walking    How long can you sit comfortably?  no limitations    How long can you walk comfortably?  he requires a RW at home    Patient Stated Goals  get back to normal; want to be able to play golf and go out to play trivia again.  His daughters want him to regain independence with going to the bathroom at night    Currently in Pain?  No/denies         TREATMENT    Therapeutic Exercise xRide L4 x 5 minutes for warm-up during history, resistance adjusted to match fatigue and intensity (3 minutes unbilled); Quantum  leg press 90# x15 x 2 Quantum leg press R leg 45# x20 Heel raises x10 30# on leg  press with constant verbal/tactile cues Standing exercises with 3# ankle weights: Hip flexion marching x 15 bilateral; Hip abduction x 15 bilateral; HS curls x 15 bilateral; Hip extension x 15 bilateral;  Neuromuscular Reeducation 6" alternating step taps 2x10 with 3# ankle weights 6" step staggered stance 3x20sec ea side no UE support, CGA     Pt educated throughout session about proper posture and technique with exercises. Improved exercise technique, movement at target joints, use of target muscles after min to mod verbal, visual, tactile cues.    Pt response/clinical impression: The patient reported mild fatigue at end of session. Most challenged by heel raises due to coordination as well as toe taps without UE support. With verbal and visual cues the patient demonstrated improved exercise technique/form, often needed cueing to  attend to task. The patient and family expressed a desire to continue gait training with SPC moving forward. The patient would benefit form further skilled PT intervention to maximize safety, mobility, and functional abilities.      PT Education - 12/08/19 1355    Education Details  therapeutic exercise form/technique    Person(s) Educated  Patient    Methods  Explanation;Verbal cues    Comprehension  Verbalized understanding;Returned demonstration;Verbal cues required;Tactile cues required;Need further instruction       PT Short Term Goals - 11/03/19 1609      PT SHORT TERM GOAL #1   Title  Pt will be independent with HEP in order to improve strength and balance in order to decrease fall risk and improve function at home and work.    Time  6    Period  Weeks    Status  On-going    Target Date  11/02/19        PT Long Term Goals - 11/03/19 1609      PT LONG TERM GOAL #1   Title  Pt will improve BERG to >50 in order to decrease his fall risk.    Baseline  09/21/19: 42/56, 11/03/19: 47/56    Time  12    Period  Weeks    Status  Revised    Target Date  12/14/19      PT LONG TERM GOAL #2   Title  Pt will decrease TUG to below 14 seconds/decrease in order to demonstrate decreased fall risk.    Baseline  09/21/19: 22.19s; 11/03/19: 19.0s    Time  12    Period  Weeks    Status  Partially Met    Target Date  12/14/19      PT LONG TERM GOAL #3   Title  Pt will decrease 5TSTS by at least 3 seconds in order to demonstrate clinically significant improvement in LE strength.    Baseline  09/21/19: 32.28s; 11/03/19: 15.5s    Time  12    Period  Weeks    Status  Achieved      PT LONG TERM GOAL #4   Title  Pt will increase his 10MWT by at least 0.13 m/s in order to demonstrate a clinically significant improvement in gait speed.    Baseline  09/21/19: Self-selected: 20.9 s = 0.48 m/s; Fastest: 17.1s =0.58 m/s; 11/03/19: Self-selected: 17.7s s = 0.56 m/s; Fastest: 13.5s =0.74 m/s with  walker    Time  12    Period  Weeks    Status  Partially Met    Target Date  12/14/19            Plan - 12/08/19 1356  Clinical Impression Statement  The patient reported mild fatigue at end of session. Most challenged by heel raises due to coordination as well as toe taps without UE support. With verbal and visual cues the patient demonstrated improved exercise technique/form, often needed cueing to attend to task. The patient and family expressed a desire to continue gait training with SPC moving forward. The patient would benefit form further skilled PT intervention to maximize safety, mobility, and functional abilities.    Personal Factors and Comorbidities  Age;Comorbidity 2    Comorbidities  HTN, CVA    Examination-Activity Limitations  Bend;Lift;Squat;Locomotion Level;Stairs;Stand    Examination-Participation Restrictions  Community Activity;Driving    Stability/Clinical Decision Making  Evolving/Moderate complexity    Rehab Potential  Good    PT Frequency  2x / week    PT Duration  12 weeks    PT Treatment/Interventions  ADLs/Self Care Home Management;Canalith Repostioning;Cryotherapy;Electrical Stimulation;Iontophoresis 58m/ml Dexamethasone;Moist Heat;Traction;Ultrasound;DME Instruction;Gait training;Stair training;Therapeutic activities;Therapeutic exercise;Functional mobility training;Balance training;Neuromuscular re-education;Patient/family education;Manual techniques;Dry needling;Vestibular;Joint Manipulations;Spinal Manipulations    PT Next Visit Plan  progress strength and balance    PT Home Exercise Plan  Access Code: [email or web address]   Consulted and Agree with Plan of Care  Patient;Family member/caregiver    Family Member Consulted  Patient's granddaughter       Patient will benefit from skilled therapeutic intervention in order to improve the following deficits and impairments:  Abnormal gait, Decreased balance, Decreased endurance, Decreased mobility, Difficulty walking,  Decreased knowledge of precautions, Decreased safety awareness, Decreased activity tolerance, Decreased strength, Hypermobility  Visit Diagnosis: Muscle weakness (generalized)  Decreased strength of lower extremity  Other abnormalities of gait and mobility  Unsteadiness on feet  Other lack of coordination  Lethargy  Right middle cerebral artery stroke (HCC)  Great toe pain, right     Problem List Patient Active Problem List   Diagnosis Date Noted  . Chronic venous insufficiency 12/05/2019  . 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 Great toe pain, right   . Lethargy   . CKD (chronic kidney disease), stage II   . Leukocytosis   . Benign essential HTN   . New onset atrial fibrillation ([place]   . Dysphagia, post-stroke   . Right middle cerebral artery stroke ([place] 05/27/2019  . Acute ischemic right MCA stroke ([place] 05/25/2019  . Goals of care, counseling/discussion   . Palliative care by specialist   . DNR (do not resuscitate) discussion   . Stroke, acute, embolic ([place] 057/26/2035 . Persistent proteinuria 07/30/2018  . 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 Chronic kidney disease, stage III (moderate) 06/15/2018  . Leg pain 05/06/2018  . Lymphedema 05/06/2018  . Coagulopathy ([place] 07/07/2017  . Lumbar spondylosis 03/10/2017  . Elevated uric acid in blood 11/20/2016  . BPH (benign prostatic hyperplasia) 05/29/2016  . Peripheral vascular disease of lower extremity ([place] 03/03/2016  . Gallstone 12/31/2015  . 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 Atherosclerosis of aorta ([place] 11/22/2015  . Carotid artery narrowing 11/22/2015  . Diverticulosis of colon 11/22/2015  . Decreased creatinine clearance 11/22/2015  . Arthritis, degenerative 11/22/2015  . Lactose intolerance 11/22/2015  . Basal cell carcinoma 11/22/2015  . Essential hypertension 06/21/2015  . Hyperlipemia 06/21/2015  . GERD (gastroesophageal reflux  disease) 06/21/2015  . Calculus of kidney 11/18/2013    DLieutenant DiegoPT, DPT 2:54 PM,12/08/19   [facility] [address]  [place], [place], [address] Phone: [phone]   Fax:  [phone]  Name: ZARIN KNUPP MRN: [id number] Date of Birth: 1926/01/25

## 2019-12-11 ENCOUNTER — Encounter: Payer: Self-pay | Admitting: Occupational Therapy

## 2019-12-11 NOTE — Therapy (Signed)
[place] MAIN [facility] SERVICES [address] [place], [place], [address] Phone: [phone]   Fax:  [phone]  Occupational Therapy Treatment  Patient Details  Name: Jose Castro MRN: [id number] Date of Birth: 11/27/1926 Referring Provider (OT): Steele Sizer   Encounter Date: 12/08/2019    Past Medical History:  Diagnosis Date  . Arthritis   . BPH (benign prostatic hyperplasia)   . Chronic kidney disease    had a kidney stone which per family was a cyst that was removed  . Dysrhythmia   . GERD (gastroesophageal reflux disease)   . History of kidney stones   . Hyperlipidemia   . Hypertension   . Splenic vein thrombosis   . Stroke ([place]) 05/22/2019  . Thrombosis 12/2015   mural  area and no notation of heart attack    Past Surgical History:  Procedure Laterality Date  . APPENDECTOMY    . CATARACT EXTRACTION, BILATERAL    . CYSTOSCOPY WITH INSERTION OF UROLIFT    . KIDNEY STONE SURGERY    . KNEE SURGERY    . PACEMAKER INSERTION N/A 07/13/2018   Procedure: INSERTION PACEMAKER-DUEL CHAMBER INITIAL IMPLANT;  Surgeon: Isaias Cowman, MD;  Location: [facility] ORS;  Service: Cardiovascular;  Laterality: N/A;  . SPINE SURGERY      There were no vitals filed for this visit.     Therapeutic Exercise:   Patient seen for grip strengthening exercises with use of resistive hand gripper on 4th setting 23# for 25 repetitions for 2 sets.  Patient requires cues for hand placement at times and how to correct when he dropped an item.  Rest breaks as needed during task.   Some soreness noted in thumb during exercise but not described as pain.   Resistive pinch pins to pick up and place onto elevated plane of motion from seated position, cues for type of pinch-lateral, 3 point or 2 point.  Patient demonstrates difficulty with most resistive pins, black in color.    Neuromuscular reeducation: Patient seen for manipulation of small toothpicks from  tabletop to pick up, place into small holed container for multiple repetitions.  Advanced to using tweezers with task to further challenge patient, some difficulty noted at times with use of tweezers and maintaining pinch long enough to place into container.    Response to tx:   Patient continues to progress well with tasks in the clinic.  We will plan to perform his reassessment next session to update progress and goals.  He and his family would like to have a member in during the session so they are aware of his measurements, progress, goals and to discuss any changes needed for home program.  Continue OT to maximize independence in necessary daily tasks.                         OT Long Term Goals - 11/03/19 2032      OT LONG TERM GOAL #1   Title  Pt. will demonstrate home management tasks with supervision initating safe strategies    Baseline  Eval: Decreased safety awareness    Time  12    Period  Weeks    Status  On-going    Target Date  12/14/19      OT LONG TERM GOAL #2   Title  Pt. will be able to identify 100% of potential safety hazards in a simulated home environment during ADLs, and IADLs.  Baseline  Eval: Limited safety awareness    Time  12    Period  Weeks    Status  Partially Met    Target Date  12/14/19      OT LONG TERM GOAL #3   Title  Pt. will increase left grip strength by 5# of force to assist with holding items duirng ADLs, and IADLs.    Baseline  Eval: decreased left grip strength    Time  12    Period  Weeks    Status  On-going    Target Date  12/14/19      OT LONG TERM GOAL #4   Title  Pt. will improve left hand [facility] skills by 3 sec of speed to be able to manipulate objects during ADLs, and IADL tasks.    Baseline  Eval: Pt. has difficulty    Time  12    Period  Weeks    Status  On-going    Target Date  12/14/19      OT LONG TERM GOAL #5   Title  Pt. will demonstrate visual compensatory strategies as needed during ADLs, and IADL  tasks.    Baseline  Eval: Limited left sided awareness    Time  12    Period  Weeks    Status  On-going    Target Date  12/14/19      OT LONG TERM GOAL #6   Title  Pt. will demonstrate cognitive compensatory strategies during ADLs, and IADLs    Baseline  Eval: Limited cognition/problem solving, impulsive    Time  12    Period  Weeks    Status  On-going    Target Date  12/14/19            Plan - 12/13/19 1318    Clinical Impression Statement  Patient continues to progress well with tasks in the clinic.  We will plan to perform his reassessment next session to update progress and goals.  He and his family would like to have a member in during the session so they are aware of his measurements, progress, goals and to discuss any changes needed for home program.  Continue OT to maximize independence in necessary daily tasks.    OT Occupational Profile and History  Detailed Assessment- Review of Records and additional review of physical, cognitive, psychosocial history related to current functional performance    Occupational performance deficits (Please refer to evaluation for details):  ADL's;IADL's    Body Structure / Function / Physical Skills  ADL;FMC;ROM;IADL;Strength;Decreased knowledge of use of DME;UE functional use    Rehab Potential  Excellent    Clinical Decision Making  Several treatment options, min-mod task modification necessary    Comorbidities Affecting Occupational Performance:  May have comorbidities impacting occupational performance    Modification or Assistance to Complete Evaluation   Min-Moderate modification of tasks or assist with assess necessary to complete eval    OT Frequency  2x / week    OT Duration  12 weeks    OT Treatment/Interventions  Self-care/ADL training;DME and/or AE instruction;Therapeutic exercise;Neuromuscular education;Patient/family education;Therapeutic activities    Consulted and Agree with Plan of Care  Patient       Patient will  benefit from skilled therapeutic intervention in order to improve the following deficits and impairments:   Body Structure / Function / Physical Skills: ADL, FMC, ROM, IADL, Strength, Decreased knowledge of use of DME, UE functional use       Visit Diagnosis: Muscle  weakness (generalized)  Unsteadiness on feet  Other lack of coordination  Right middle cerebral artery stroke [facility])    Problem List Patient Active Problem List   Diagnosis Date Noted  . Chronic venous insufficiency 12/05/2019  . 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 Great toe pain, right   . Lethargy   . CKD (chronic kidney disease), stage II   . Leukocytosis   . Benign essential HTN   . New onset atrial fibrillation ([place])   . Dysphagia, post-stroke   . Right middle cerebral artery stroke ([place]) 05/27/2019  . Acute ischemic right MCA stroke ([place]) 05/25/2019  . Goals of care, counseling/discussion   . Palliative care by specialist   . DNR (do not resuscitate) discussion   . Stroke, acute, embolic ([place]) 56/72/0919  . Persistent proteinuria 07/30/2018  . 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 Chronic kidney disease, stage III (moderate) 06/15/2018  . Leg pain 05/06/2018  . Lymphedema 05/06/2018  . Coagulopathy ([place]) 07/07/2017  . Lumbar spondylosis 03/10/2017  . Elevated uric acid in blood 11/20/2016  . BPH (benign prostatic hyperplasia) 05/29/2016  . Peripheral vascular disease of lower extremity ([place]) 03/03/2016  . Gallstone 12/31/2015  . 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 Atherosclerosis of aorta ([place]) 11/22/2015  . Carotid artery narrowing 11/22/2015  . Diverticulosis of colon 11/22/2015  . Decreased creatinine clearance 11/22/2015  . Arthritis, degenerative 11/22/2015  . Lactose intolerance 11/22/2015  . Basal cell carcinoma 11/22/2015  . Essential hypertension 06/21/2015  . Hyperlipemia 06/21/2015  . GERD (gastroesophageal reflux disease)  06/21/2015  . Calculus of kidney 11/18/2013   Achilles Dunk, OTR/L, CLT  Elliette Seabolt 12/13/2019, 1:18 PM  [place] MAIN [facility] SERVICES [address] [place], [place], [address] Phone: [phone]   Fax:  [phone]  Name: Jose Castro MRN: [id number] Date of Birth: 02-Jul-1926

## 2019-12-13 ENCOUNTER — Ambulatory Visit: Payer: Medicare Other | Admitting: Occupational Therapy

## 2019-12-13 ENCOUNTER — Encounter: Payer: Self-pay | Admitting: Occupational Therapy

## 2019-12-13 ENCOUNTER — Ambulatory Visit: Payer: Medicare Other

## 2019-12-13 ENCOUNTER — Other Ambulatory Visit: Payer: Self-pay

## 2019-12-13 DIAGNOSIS — R2681 Unsteadiness on feet: Secondary | ICD-10-CM

## 2019-12-13 DIAGNOSIS — M6281 Muscle weakness (generalized): Secondary | ICD-10-CM | POA: Diagnosis not present

## 2019-12-13 DIAGNOSIS — R29898 Other symptoms and signs involving the musculoskeletal system: Secondary | ICD-10-CM

## 2019-12-13 DIAGNOSIS — R278 Other lack of coordination: Secondary | ICD-10-CM

## 2019-12-13 NOTE — Therapy (Signed)
[place] MAIN [facility] SERVICES [address] [place], [place], [address] Phone: [phone]   Fax:  [phone]  Physical Therapy Treatment  Patient Details  Name: Jose Castro MRN: [id number] Date of Birth: [birth year] Referring Provider (PT): Dr. Letta Pate   Encounter Date: 12/13/2019  PT End of Session - 12/13/19 1423    Visit Number  20    Number of Visits  49    Date for PT Re-Evaluation  03/06/20    PT Start Time  8546    PT Stop Time  1500    PT Time Calculation (min)  45 min    Equipment Utilized During Treatment  Gait belt    Activity Tolerance  Patient tolerated treatment well    Behavior During Therapy  [facility] for tasks assessed/performed       Past Medical History:  Diagnosis Date  . Arthritis   . BPH (benign prostatic hyperplasia)   . Chronic kidney disease    had a kidney stone which per family was a cyst that was removed  . Dysrhythmia   . GERD (gastroesophageal reflux disease)   . History of kidney stones   . Hyperlipidemia   . Hypertension   . Splenic vein thrombosis   . Stroke ([place]) 05/22/2019  . Thrombosis 12/2015   mural  area and no notation of heart attack    Past Surgical History:  Procedure Laterality Date  . APPENDECTOMY    . CATARACT EXTRACTION, BILATERAL    . CYSTOSCOPY WITH INSERTION OF UROLIFT    . KIDNEY STONE SURGERY    . KNEE SURGERY    . PACEMAKER INSERTION N/A 07/13/2018   Procedure: INSERTION PACEMAKER-DUEL CHAMBER INITIAL IMPLANT;  Surgeon: Isaias Cowman, MD;  Location: [facility] ORS;  Service: Cardiovascular;  Laterality: N/A;  . SPINE SURGERY      There were no vitals filed for this visit.  Subjective Assessment - 12/13/19 1422    Subjective  Patient reported that he is doing well today. No falls sicne last therapy session. His L knee "has been doing good" and he denies any resting pain upon arrival.    Patient is accompained by:  Family member    Pertinent History  Pt is a 86 year old  male who presents with imbalance and difficulty with gait following a right frontal CVA with left hemiparesis on 05/22/19.  He has completed inpatient and home health PT, and now presents for OP PT.  He was discharged from inpatient rehab at a supervision level of assistance, ambulating with a RW.  His family notes that he is impulsive and will try to ambulate without his RW at home.  Family is currently providing 24/7 supervision.    Limitations  House hold activities;Walking    How long can you sit comfortably?  no limitations    How long can you walk comfortably?  he requires a RW at home    Patient Stated Goals  get back to normal; want to be able to play golf and go out to play trivia again.  His daughters want him to regain independence with going to the bathroom at night    Currently in Pain?  No/denies         [facility] PT Assessment - 12/13/19 1443      Standardized Balance Assessment   Standardized Balance Assessment  Berg Balance Test      Berg Balance Test   Sit to Stand  Able to stand without using hands and stabilize  independently    Standing Unsupported  Able to stand safely 2 minutes    Sitting with Back Unsupported but Feet Supported on Floor or Stool  Able to sit safely and securely 2 minutes    Stand to Sit  Sits safely with minimal use of hands    Transfers  Able to transfer safely, minor use of hands    Standing Unsupported with Eyes Closed  Able to stand 10 seconds safely    Standing Unsupported with Feet Together  Able to place feet together independently and stand 1 minute safely    From Standing, Reach Forward with Outstretched Arm  Can reach confidently >25 cm (10")    From Standing Position, Pick up Object from Floor  Able to pick up shoe, needs supervision    From Standing Position, Turn to Look Behind Over each Shoulder  Looks behind one side only/other side shows less weight shift    Turn 360 Degrees  Able to turn 360 degrees safely but slowly    Standing  Unsupported, Alternately Place Feet on Step/Stool  Able to complete 4 steps without aid or supervision    Standing Unsupported, One Foot in Front  Able to plae foot ahead of the other independently and hold 30 seconds    Standing on One Leg  Tries to lift leg/unable to hold 3 seconds but remains standing independently    Total Score  46          TREATMENT   Therapeutic Exercise NuStep L0-2 x 4 minutes for warm-up during history; Updated outcome measures with patient including: 5TSTS: 14.2s BERG: 46/56  TUG: 17.0s 73mgait speed: Self-selected:  12.6s =  0.79 m/s; Fastest: 12.2 = 0.82 m/s with walker; Seated marches with manual resistance x 10 bilateral Seated clams with manual resistance x 10 bilateral; Seated adductor squeeze with manual resistance x 10 bilateral; Seated heel raises with manual resistance x 10;   Neuromuscular Re-education  Tested reaction time with significant impairment noted with posterior LOB. Poor stepping strategy with need for bilateral UE assist on parallel bars to prevent fall; Dynamic balance training with single point cane ambulation in rehab gym with horizontal and vertical head turns on command as well as gait speed changes and 180 degree turns;   Pt educated throughout session about proper posture and technique with exercises. Improved exercise technique, movement at target joints, use of target muscles after min to mod verbal, visual, tactile cues.   Updated outcome measures and goals with patient today. He demonstrates improvement in all of his outcome measures with the exception of essentially unchanged score on the BERG..Marland KitchenHe performed faster 134mait speed both self-selected and fastest. TUG dropped to 17.0s today and 5TSTS dropped from 32.3s initially to 14.2s today. He demonstrated good motivation throughout today's session and is able to complete some additional strengthening/balance exercises after performing his outcome measures. He  continues to demonstrate decreased reaction time with posterior perturbation testing. He is now ambulating around the home without contact guard by family with the use of a rolling walker but is still not safe to transition to a single point cane. Pt encouraged to continue his HEP. Hewill continue to benefit from skilled PT services to address deficits in balance and decrease risk for future falls.                        PT Short Term Goals - 12/13/19 1613      PT SHORT  TERM GOAL #1   Title  Pt will be independent with HEP in order to improve strength and balance in order to decrease fall risk and improve function at home and work.    Time  6    Period  Weeks    Status  On-going    Target Date  01/24/20        PT Long Term Goals - 12/13/19 1613      PT LONG TERM GOAL #1   Title  Pt will improve BERG to >50 in order to decrease his fall risk.    Baseline  09/21/19: 42/56, 11/03/19: 47/56; 12/13/19: 46/56    Time  12    Period  Weeks    Status  Partially Met    Target Date  03/06/20      PT LONG TERM GOAL #2   Title  Pt will decrease TUG to below 14 seconds/decrease in order to demonstrate decreased fall risk.    Baseline  09/21/19: 22.19s; 11/03/19: 19.0s; 12/13/19: 17.0s    Time  12    Period  Weeks    Status  Partially Met    Target Date  03/06/20      PT LONG TERM GOAL #3   Title  Pt will decrease 5TSTS by at least 3 seconds in order to demonstrate clinically significant improvement in LE strength.    Baseline  09/21/19: 32.28s; 11/03/19: 15.5s; 12/13/19: 14.2s    Time  12    Period  Weeks    Status  Achieved      PT LONG TERM GOAL #4   Title  Pt will increase his 10MWT to >1.0 m/s in order to demonstrate a clinically significant improvement in gait speed and improved community ambulation    Baseline  09/21/19: Self-selected: 20.9 s = 0.48 m/s; Fastest: 17.1s =0.58 m/s; 11/03/19: Self-selected: 17.7s s = 0.56 m/s; Fastest: 13.5s =0.74 m/s with walker;  12/13/19: Self-selected:  12.6s =  0.79 m/s; Fastest: 12.2 = 0.82 m/s with walker    Time  12    Period  Weeks    Status  Revised    Target Date  03/06/20            Plan - 12/13/19 1424    Clinical Impression Statement  Updated outcome measures and goals with patient today. He demonstrates improvement in all of his outcome measures with the exception of essentially unchanged score on the BERG.Marland Kitchen He performed faster 2mgait speed both self-selected and fastest. TUG dropped to 17.0s today and 5TSTS dropped from 32.3s initially to 14.2s today. He demonstrated good motivation throughout today's session and is able to complete some additional strengthening/balance exercises after performing his outcome measures. He continues to demonstrate decreased reaction time with posterior perturbation testing. He is now ambulating around the home without contact guard by family with the use of a rolling walker but is still not safe to transition to a single point cane. Pt encouraged to continue his HEP. He will continue to benefit from skilled PT services to address deficits in balance and decrease risk for future falls.    Personal Factors and Comorbidities  Age;Comorbidity 2    Comorbidities  HTN, CVA    Examination-Activity Limitations  Bend;Lift;Squat;Locomotion Level;Stairs;Stand    Examination-Participation Restrictions  Community Activity;Driving    Stability/Clinical Decision Making  Evolving/Moderate complexity    Rehab Potential  Good    PT Frequency  2x / week    PT Duration  12 weeks  PT Treatment/Interventions  ADLs/Self Care Home Management;Canalith Repostioning;Cryotherapy;Electrical Stimulation;Iontophoresis 44m/ml Dexamethasone;Moist Heat;Traction;Ultrasound;DME Instruction;Gait training;Stair training;Therapeutic activities;Therapeutic exercise;Functional mobility training;Balance training;Neuromuscular re-education;Patient/family education;Manual techniques;Dry needling;Vestibular;Joint  Manipulations;Spinal Manipulations    PT Next Visit Plan  progress strength and balance    PT Home Exercise Plan  Access Code: [email or web address]   Consulted and Agree with Plan of Care  Patient;Family member/caregiver    Family Member Consulted  Patient's granddaughter       Patient will benefit from skilled therapeutic intervention in order to improve the following deficits and impairments:  Abnormal gait, Decreased balance, Decreased endurance, Decreased mobility, Difficulty walking, Decreased knowledge of precautions, Decreased safety awareness, Decreased activity tolerance, Decreased strength, Hypermobility  Visit Diagnosis: Muscle weakness (generalized)  Decreased strength of lower extremity  Unsteadiness on feet     Problem List Patient Active Problem List   Diagnosis Date Noted  . Chronic venous insufficiency 12/05/2019  . 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 Great toe pain, right   . Lethargy   . CKD (chronic kidney disease), stage II   . Leukocytosis   . Benign essential HTN   . New onset atrial fibrillation ([place]   . Dysphagia, post-stroke   . Right middle cerebral artery stroke ([place] 05/27/2019  . Acute ischemic right MCA stroke ([place] 05/25/2019  . Goals of care, counseling/discussion   . Palliative care by specialist   . DNR (do not resuscitate) discussion   . Stroke, acute, embolic ([place] 037/90/2409 . Persistent proteinuria 07/30/2018  . 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 Chronic kidney disease, stage III (moderate) 06/15/2018  . Leg pain 05/06/2018  . Lymphedema 05/06/2018  . Coagulopathy ([place] 07/07/2017  . Lumbar spondylosis 03/10/2017  . Elevated uric acid in blood 11/20/2016  . BPH (benign prostatic hyperplasia) 05/29/2016  . Peripheral vascular disease of lower extremity ([place] 03/03/2016  . Gallstone 12/31/2015  . 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 Atherosclerosis of aorta ([place] 11/22/2015   . Carotid artery narrowing 11/22/2015  . Diverticulosis of colon 11/22/2015  . Decreased creatinine clearance 11/22/2015  . Arthritis, degenerative 11/22/2015  . Lactose intolerance 11/22/2015  . Basal cell carcinoma 11/22/2015  . Essential hypertension 06/21/2015  . Hyperlipemia 06/21/2015  . GERD (gastroesophageal reflux disease) 06/21/2015  . Calculus of kidney 11/18/2013   JPhillips GroutPT, DPT, GCS  Yukari Flax 12/13/2019, 4:22 PM  [facility] [address] [place] [address]Phone: [phone]  Fax:  [phone] Name: Jose MARLETTEMRN: [id number] of Birth: [birth year]

## 2019-12-15 ENCOUNTER — Ambulatory Visit: Payer: Medicare Other

## 2019-12-15 ENCOUNTER — Other Ambulatory Visit: Payer: Self-pay

## 2019-12-15 DIAGNOSIS — R29898 Other symptoms and signs involving the musculoskeletal system: Secondary | ICD-10-CM

## 2019-12-15 DIAGNOSIS — M6281 Muscle weakness (generalized): Secondary | ICD-10-CM | POA: Diagnosis not present

## 2019-12-15 DIAGNOSIS — R2681 Unsteadiness on feet: Secondary | ICD-10-CM

## 2019-12-15 NOTE — Therapy (Addendum)
[place] MAIN [facility] SERVICES [address] [place], [place], [address] Phone: [phone]   Fax:  [phone]  Physical Therapy Treatment  Patient Details  Name: Jose Castro MRN: [id number] Date of Birth: 1926-03-29 Referring Provider (PT): Dr. Letta Pate   Encounter Date: 12/15/2019  PT End of Session - 12/15/19 1717    Visit Number  21    Number of Visits  49    Date for PT Re-Evaluation  03/06/20    PT Start Time  2025    PT Stop Time  1430    PT Time Calculation (min)  45 min    Equipment Utilized During Treatment  Gait belt    Activity Tolerance  Patient tolerated treatment well    Behavior During Therapy  [facility] for tasks assessed/performed       Past Medical History:  Diagnosis Date  . Arthritis   . BPH (benign prostatic hyperplasia)   . Chronic kidney disease    had a kidney stone which per family was a cyst that was removed  . Dysrhythmia   . GERD (gastroesophageal reflux disease)   . History of kidney stones   . Hyperlipidemia   . Hypertension   . Splenic vein thrombosis   . Stroke ([place]) 05/22/2019  . Thrombosis 12/2015   mural  area and no notation of heart attack    Past Surgical History:  Procedure Laterality Date  . APPENDECTOMY    . CATARACT EXTRACTION, BILATERAL    . CYSTOSCOPY WITH INSERTION OF UROLIFT    . KIDNEY STONE SURGERY    . KNEE SURGERY    . PACEMAKER INSERTION N/A 07/13/2018   Procedure: INSERTION PACEMAKER-DUEL CHAMBER INITIAL IMPLANT;  Surgeon: Isaias Cowman, MD;  Location: [facility] ORS;  Service: Cardiovascular;  Laterality: N/A;  . SPINE SURGERY      There were no vitals filed for this visit.  Subjective Assessment - 12/15/19 1354    Subjective  Patient reports that he is doing well today. No falls since last therapy session. No resting L knee pain upon arrival today. No specific questions or concerns.    Patient is accompained by:  Family member    Pertinent History  Pt is a 86 year old  male who presents with imbalance and difficulty with gait following a right frontal CVA with left hemiparesis on 05/22/19.  He has completed inpatient and home health PT, and now presents for OP PT.  He was discharged from inpatient rehab at a supervision level of assistance, ambulating with a RW.  His family notes that he is impulsive and will try to ambulate without his RW at home.  Family is currently providing 24/7 supervision.    Limitations  House hold activities;Walking    How long can you sit comfortably?  no limitations    How long can you walk comfortably?  he requires a RW at home    Patient Stated Goals  get back to normal; want to be able to play golf and go out to play trivia again.  His daughters want him to regain independence with going to the bathroom at night    Currently in Pain?  No/denies           TREATMENT  Gait Training Gait training in hallway with single point cane in RUE. Pt is able to ambulate a full lap around downstairs of hospital approximately [phone].' Pt performs horizontal and vertical head turns with cues from therapist as well as gait speed changes.  He demonstrates some lateral staggering when performing head turns as well as some inconsistent cane placement. No overt LOB requiring external support from therapist to prevent falling;   Therapeutic Exercise NuStep L2-4 x 5 minutes for warm-up during history, resistance adjusted to match fatigue and intensity (3 minutes unbilled); Sit to stand without UE support from regular height chair x 10;  Standing exercises with 3# ankle weights: Hip flexion marching without UE support x 15 bilateral; Hip abduction x 15 bilateral; HS curls x 15 bilateral; Hip extension x 15 bilateral;  Seated clams with manual resistance x 15; Seated adductor squeeze with manual resistance x 15; Standing heel raises with bilateral UE support 3s hold x 10; Side stepping in // bars without UE support x 2 each length;   Pt  educated throughout session about proper posture and technique with exercises. Improved exercise technique, movement at target joints, use of target muscles after min to mod verbal, visual, tactile cues.   Pt demonstrates excellent motivation during session today. Performed gait training today with patient in hallway using single point cane in RUE. He is able to ambulate a full lap around downstairs of hospital approximately [phone].' Pt performs horizontal and vertical head turns with cues from therapist as well as gait speed changes. He demonstrates some lateral staggering when performing head turns as well as some inconsistent cane placement. No overt LOB requiring external support from therapist to prevent falling. Pt is able to complete all exercises as instructed by therapist today including seated and standing exercises.Hewill benefit from skilled PT services to address deficits in balance and decrease risk for future falls.                       PT Short Term Goals - 12/13/19 1613      PT SHORT TERM GOAL #1   Title  Pt will be independent with HEP in order to improve strength and balance in order to decrease fall risk and improve function at home and work.    Time  6    Period  Weeks    Status  On-going    Target Date  01/24/20        PT Long Term Goals - 12/13/19 1613      PT LONG TERM GOAL #1   Title  Pt will improve BERG to >50 in order to decrease his fall risk.    Baseline  09/21/19: 42/56, 11/03/19: 47/56; 12/13/19: 46/56    Time  12    Period  Weeks    Status  Partially Met    Target Date  03/06/20      PT LONG TERM GOAL #2   Title  Pt will decrease TUG to below 14 seconds/decrease in order to demonstrate decreased fall risk.    Baseline  09/21/19: 22.19s; 11/03/19: 19.0s; 12/13/19: 17.0s    Time  12    Period  Weeks    Status  Partially Met    Target Date  03/06/20      PT LONG TERM GOAL #3   Title  Pt will decrease 5TSTS by at least 3  seconds in order to demonstrate clinically significant improvement in LE strength.    Baseline  09/21/19: 32.28s; 11/03/19: 15.5s; 12/13/19: 14.2s    Time  12    Period  Weeks    Status  Achieved      PT LONG TERM GOAL #4   Title  Pt will increase his 10MWT to >  1.0 m/s in order to demonstrate a clinically significant improvement in gait speed and improved community ambulation    Baseline  09/21/19: Self-selected: 20.9 s = 0.48 m/s; Fastest: 17.1s =0.58 m/s; 11/03/19: Self-selected: 17.7s s = 0.56 m/s; Fastest: 13.5s =0.74 m/s with walker; 12/13/19: Self-selected:  12.6s =  0.79 m/s; Fastest: 12.2 = 0.82 m/s with walker    Time  12    Period  Weeks    Status  Revised    Target Date  03/06/20            Plan - 12/15/19 1717    Clinical Impression Statement  Pt demonstrates excellent motivation during session today. Performed gait training today with patient in hallway using single point cane in RUE. He is able to ambulate a full lap around downstairs of hospital approximately [phone].' Pt performs horizontal and vertical head turns with cues from therapist as well as gait speed changes. He demonstrates some lateral staggering when performing head turns as well as some inconsistent cane placement. No overt LOB requiring external support from therapist to prevent falling. Pt is able to complete all exercises as instructed by therapist today including seated and standing exercises. He will benefit from skilled PT services to address deficits in balance and decrease risk for future falls.    Personal Factors and Comorbidities  Age;Comorbidity 2    Comorbidities  HTN, CVA    Examination-Activity Limitations  Bend;Lift;Squat;Locomotion Level;Stairs;Stand    Examination-Participation Restrictions  Community Activity;Driving    Stability/Clinical Decision Making  Evolving/Moderate complexity    Rehab Potential  Good    PT Frequency  2x / week    PT Duration  12 weeks    PT Treatment/Interventions   ADLs/Self Care Home Management;Canalith Repostioning;Cryotherapy;Electrical Stimulation;Iontophoresis '4mg'$ /ml Dexamethasone;Moist Heat;Traction;Ultrasound;DME Instruction;Gait training;Stair training;Therapeutic activities;Therapeutic exercise;Functional mobility training;Balance training;Neuromuscular re-education;Patient/family education;Manual techniques;Dry needling;Vestibular;Joint Manipulations;Spinal Manipulations    PT Next Visit Plan  progress strength and balance    PT Home Exercise Plan  Access Code: [email or web address]    Consulted and Agree with Plan of Care  Patient;Family member/caregiver    Family Member Consulted  Patient's granddaughter       Patient will benefit from skilled therapeutic intervention in order to improve the following deficits and impairments:  Abnormal gait, Decreased balance, Decreased endurance, Decreased mobility, Difficulty walking, Decreased knowledge of precautions, Decreased safety awareness, Decreased activity tolerance, Decreased strength, Hypermobility  Visit Diagnosis: Muscle weakness (generalized)  Decreased strength of lower extremity  Unsteadiness on feet     Problem List Patient Active Problem List   Diagnosis Date Noted  . Chronic venous insufficiency 12/05/2019  . 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 Great toe pain, right   . Lethargy   . CKD (chronic kidney disease), stage II   . Leukocytosis   . Benign essential HTN   . New onset atrial fibrillation ([place])   . Dysphagia, post-stroke   . Right middle cerebral artery stroke ([place]) 05/27/2019  . Acute ischemic right MCA stroke ([place]) 05/25/2019  . Goals of care, counseling/discussion   . Palliative care by specialist   . DNR (do not resuscitate) discussion   . Stroke, acute, embolic ([place]) 76/73/4193  . Persistent proteinuria 07/30/2018  . 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 Chronic kidney disease, stage III (moderate) 06/15/2018  . Leg pain 05/06/2018  .  Lymphedema 05/06/2018  . Coagulopathy ([place]) 07/07/2017  . Lumbar spondylosis 03/10/2017  . Elevated uric acid in blood 11/20/2016  .  BPH (benign prostatic hyperplasia) 05/29/2016  . Peripheral vascular disease of lower extremity ([place]) 03/03/2016  . Gallstone 12/31/2015  . 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 Atherosclerosis of aorta ([place]) 11/22/2015  . Carotid artery narrowing 11/22/2015  . Diverticulosis of colon 11/22/2015  . Decreased creatinine clearance 11/22/2015  . Arthritis, degenerative 11/22/2015  . Lactose intolerance 11/22/2015  . Basal cell carcinoma 11/22/2015  . Essential hypertension 06/21/2015  . Hyperlipemia 06/21/2015  . GERD (gastroesophageal reflux disease) 06/21/2015  . Calculus of kidney 11/18/2013   Phillips Grout PT, DPT, GCS  Jose Castro 12/15/2019, 5:24 PM  [place] MAIN [facility] SERVICES [address] [place], [place], [address] Phone: [phone]   Fax:  [phone]  Name: Jose Castro MRN: [id number] Date of Birth: 11-Nov-1926

## 2019-12-16 NOTE — Therapy (Signed)
[place] MAIN [facility] SERVICES [address] [place], [place], [address] Phone: [phone]   Fax:  [phone]  Occupational Therapy Treatment/Recertification  Patient Details  Name: Jose Castro MRN: [id number] Date of Birth: [birth year] Referring Provider (OT): Steele Sizer   Encounter Date: 12/13/2019  OT End of Session - 12/16/19 1442    Visit Number  20    Number of Visits  26    Date for OT Re-Evaluation  01/27/20    Authorization Type  progress report from 12/13/2019    OT Start Time  1328    OT Stop Time  1415    OT Time Calculation (min)  47 min    Activity Tolerance  Patient tolerated treatment well    Behavior During Therapy  [facility] for tasks assessed/performed       Past Medical History:  Diagnosis Date  . Arthritis   . BPH (benign prostatic hyperplasia)   . Chronic kidney disease    had a kidney stone which per family was a cyst that was removed  . Dysrhythmia   . GERD (gastroesophageal reflux disease)   . History of kidney stones   . Hyperlipidemia   . Hypertension   . Splenic vein thrombosis   . Stroke ([place]) 05/22/2019  . Thrombosis 12/2015   mural  area and no notation of heart attack    Past Surgical History:  Procedure Laterality Date  . APPENDECTOMY    . CATARACT EXTRACTION, BILATERAL    . CYSTOSCOPY WITH INSERTION OF UROLIFT    . KIDNEY STONE SURGERY    . KNEE SURGERY    . PACEMAKER INSERTION N/A 07/13/2018   Procedure: INSERTION PACEMAKER-DUEL CHAMBER INITIAL IMPLANT;  Surgeon: Isaias Cowman, MD;  Location: [facility] ORS;  Service: Cardiovascular;  Laterality: N/A;  . SPINE SURGERY      There were no vitals filed for this visit.  Subjective Assessment - 12/16/19 1440    Subjective   Patient's daughter could not say for session today during reassessment, patient asked for therapist to speak to daughter about progress when she picked him up.    Pertinent History  Pt. is a 86 y.o. male who was admitted  to [facility] with a [facility] Infarct on 05/21/2019. Pt. went through Inpatient Rehab, and had home health OT, PT, and ST services. Pt. was previously residing home alone, and was independent with ADLs, and IADLs. Pt. now has family staying with the pt. around the clock.    Patient Stated Goals  to regain functional independence safely    Currently in Pain?  No/denies    Pain Score  0-No pain         [facility] OT Assessment - 12/16/19 1448      Assessment   Medical Diagnosis  CVA, abnormalities in gait, unsteadiness    Referring Provider (OT)  Ancil Boozer, Drue Stager    Onset Date/Surgical Date  05/21/19    Hand Dominance  Right      Precautions   Precautions  Fall      ADL   Eating/Feeding  Independent    Grooming  Independent    Upper Body Bathing  Independent    Lower Body Bathing  Independent    Upper Body Dressing  Independent    Lower Body Dressing  Independent    Toilet Transfer  Independent    Toileting -  Hygiene  Independent    Tub/Shower Transfer  Modified independent      IADL  Prior Level of Function Shopping  independent    Shopping  Needs to be accompanied on any shopping trip    Prior Level of Function Light Housekeeping  independent    Light Housekeeping  Needs help with all home maintenance tasks;Performs light daily tasks such as dishwashing, bed making;All laundry must be done by others    Prior Level of Function Meal Prep  independent    Meal Prep  Able to complete simple warm meal prep;Able to complete simple cold meal and snack prep    Prior Level of Function Community Mobility  independent    Community Mobility  Relies on family or friends for transportation    Prior Level of Function Medication Managment  independent    Medication Management  Takes responsibility if medication is prepared in advance in seperate dosage    Prior Level of Function Financial Management  independent    Financial Management  Manages day-to-day purchases, but needs  help with banking, major purchases, etc.;Requires supervision/minimal cuing      Mobility   Mobility Status  Needs assist    Mobility Status Comments  uses rolling walker      Written Expression   Handwriting  75% legible      Vision - History   Baseline Vision  Bifocals      Sensation   Light Touch  Appears Intact    Stereognosis  Appears Intact    Hot/Cold  Appears Intact    Proprioception  Appears Intact      Coordination   Right 9 Hole Peg Test  25    Left 9 Hole Peg Test  33      Hand Function   Right Hand Grip (lbs)  60    Right Hand Lateral Pinch  16 lbs    Right Hand 3 Point Pinch  16 lbs    Left Hand Grip (lbs)  43    Left Hand Lateral Pinch  17 lbs    Left 3 point pinch  15 lbs        Reassessment this date as outlined in above flowsheet Reassessment of ADLs/IADLs, strength, ROM, coordination and vision               OT Education - 12/16/19 1442    Education Details  reassessment, goals and plan of care    Person(s) Educated  Patient    Methods  Explanation;Demonstration    Comprehension  Verbalized understanding;Returned demonstration;Verbal cues required          OT Long Term Goals - 12/16/19 1444      OT LONG TERM GOAL #1   Title  Pt. will demonstrate home management tasks with supervision initating safe strategies    Baseline  Eval: Decreased safety awareness, 12/13/19 min assist with home management tasks    Time  12    Period  Weeks    Status  On-going    Target Date  01/27/20      OT LONG TERM GOAL #2   Title  Pt. will be able to identify 100% of potential safety hazards in a simulated home environment during ADLs, and IADLs.    Baseline  Eval: Limited safety awareness    Time  12    Period  Weeks    Status  Achieved      OT LONG TERM GOAL #3   Title  Pt. will increase left grip strength by 5# of force to assist with holding items duirng ADLs, and IADLs.  Baseline  Eval: decreased left grip strength, 12/13/19 increased by  4#    Time  12    Period  Weeks    Status  On-going    Target Date  01/27/20      OT LONG TERM GOAL #4   Title  Pt. will improve left hand [facility] skills by 3 sec of speed to be able to manipulate objects during ADLs, and IADL tasks.    Baseline  improved by 2 secs 12/13/19    Time  12    Period  Weeks    Status  On-going    Target Date  01/27/20      OT LONG TERM GOAL #5   Title  Pt. will demonstrate visual compensatory strategies as needed during ADLs, and IADL tasks.    Baseline  Eval: Limited left sided awareness, 12/13/19 improved but continues to require some min cues with IADL tasks    Time  12    Period  Weeks    Status  On-going    Target Date  01/27/20      OT LONG TERM GOAL #6   Title  Pt. will demonstrate cognitive compensatory strategies during ADLs, and IADLs    Time  12    Period  Weeks    Status  Achieved            Plan - 12/16/19 1443    Clinical Impression Statement  Patient has continued to make good progress in all areas. He demonstrates modified independence with basic self care tasks, improved attention to left visual field, good safety awareness and problem solving.  He lives at home and family has been helping with homemanagement tasks. He is able to help with making the bed, light meal preparation and light housekeeping.  He does not participate in laundry, changing bed sheets, or heavier household chores such as vacuuming and mopping.  He continues to demonstrate impairments in balance with functional mobility tasks.   He would benefit from continued OT services one time a week to continue with focus on strength, coordination and IADL tasks.    OT Occupational Profile and History  Detailed Assessment- Review of Records and additional review of physical, cognitive, psychosocial history related to current functional performance    Occupational performance deficits (Please refer to evaluation for details):  ADL's;IADL's    Body Structure / Function / Physical  Skills  ADL;FMC;ROM;IADL;Strength;Decreased knowledge of use of DME;UE functional use    Rehab Potential  Excellent    Clinical Decision Making  Several treatment options, min-mod task modification necessary    Comorbidities Affecting Occupational Performance:  May have comorbidities impacting occupational performance    Modification or Assistance to Complete Evaluation   Min-Moderate modification of tasks or assist with assess necessary to complete eval    OT Frequency  1x / week    OT Duration  6 weeks    OT Treatment/Interventions  Self-care/ADL training;DME and/or AE instruction;Therapeutic exercise;Neuromuscular education;Patient/family education;Therapeutic activities    Consulted and Agree with Plan of Care  Patient       Patient will benefit from skilled therapeutic intervention in order to improve the following deficits and impairments:   Body Structure / Function / Physical Skills: ADL, FMC, ROM, IADL, Strength, Decreased knowledge of use of DME, UE functional use       Visit Diagnosis: Muscle weakness (generalized)  Other lack of coordination  Unsteadiness on feet    Problem List Patient Active Problem List   Diagnosis  Date Noted  . Chronic venous insufficiency 12/05/2019  . 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 Great toe pain, right   . Lethargy   . CKD (chronic kidney disease), stage II   . Leukocytosis   . Benign essential HTN   . New onset atrial fibrillation ([place])   . Dysphagia, post-stroke   . 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 Acute ischemic right MCA stroke ([place]) 05/25/2019  . Goals of care, counseling/discussion   . Palliative care by specialist   . DNR (do not resuscitate) discussion   . Stroke, acute, embolic ([place]) 62/26/3335  . Persistent proteinuria 07/30/2018  . 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 Chronic kidney disease, stage III (moderate) 06/15/2018  . Leg pain 05/06/2018  . Lymphedema  05/06/2018  . Coagulopathy ([place]) 07/07/2017  . Lumbar spondylosis 03/10/2017  . Elevated uric acid in blood 11/20/2016  . BPH (benign prostatic hyperplasia) 05/29/2016  . Peripheral vascular disease of lower extremity ([place]) 03/03/2016  . Gallstone 12/31/2015  . 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 Atherosclerosis of aorta ([place]) 11/22/2015  . Carotid artery narrowing 11/22/2015  . Diverticulosis of colon 11/22/2015  . Decreased creatinine clearance 11/22/2015  . Arthritis, degenerative 11/22/2015  . Lactose intolerance 11/22/2015  . Basal cell carcinoma 11/22/2015  . Essential hypertension 06/21/2015  . Hyperlipemia 06/21/2015  . GERD (gastroesophageal reflux disease) 06/21/2015  . Calculus of kidney 11/18/2013   Achilles Dunk, OTR/L, CLT  Taniaya Rudder 12/16/2019, 2:54 PM  [place] MAIN [facility] SERVICES [address] [place], [place], [address] Phone: [phone]   Fax:  [phone]  Name: DAMARIS GEERS MRN: [id number] Date of Birth: 17-Feb-1926

## 2019-12-20 ENCOUNTER — Ambulatory Visit: Payer: Medicare Other | Admitting: Occupational Therapy

## 2019-12-20 ENCOUNTER — Other Ambulatory Visit: Payer: Self-pay

## 2019-12-20 ENCOUNTER — Ambulatory Visit: Payer: Medicare Other

## 2019-12-20 DIAGNOSIS — R2681 Unsteadiness on feet: Secondary | ICD-10-CM

## 2019-12-20 DIAGNOSIS — M6281 Muscle weakness (generalized): Secondary | ICD-10-CM

## 2019-12-20 DIAGNOSIS — R29898 Other symptoms and signs involving the musculoskeletal system: Secondary | ICD-10-CM

## 2019-12-20 NOTE — Therapy (Signed)
[place] MAIN [facility] SERVICES [address] [place], [place], [address] Phone: [phone]   Fax:  [phone]  Physical Therapy Treatment  Patient Details  Name: Jose Castro MRN: [id number] Date of Birth: 01-Jun-1926 Referring Provider (PT): Dr. Letta Pate   Encounter Date: 12/20/2019  PT End of Session - 12/20/19 1357    Visit Number  22    Number of Visits  49    Date for PT Re-Evaluation  03/06/20    PT Start Time  1350    PT Stop Time  1430    PT Time Calculation (min)  40 min    Equipment Utilized During Treatment  Gait belt    Activity Tolerance  Patient tolerated treatment well;No increased pain;Patient limited by fatigue    Behavior During Therapy  [facility] for tasks assessed/performed       Past Medical History:  Diagnosis Date  . Arthritis   . BPH (benign prostatic hyperplasia)   . Chronic kidney disease    had a kidney stone which per family was a cyst that was removed  . Dysrhythmia   . GERD (gastroesophageal reflux disease)   . History of kidney stones   . Hyperlipidemia   . Hypertension   . Splenic vein thrombosis   . Stroke ([place]) 05/22/2019  . Thrombosis 12/2015   mural  area and no notation of heart attack    Past Surgical History:  Procedure Laterality Date  . APPENDECTOMY    . CATARACT EXTRACTION, BILATERAL    . CYSTOSCOPY WITH INSERTION OF UROLIFT    . KIDNEY STONE SURGERY    . KNEE SURGERY    . PACEMAKER INSERTION N/A 07/13/2018   Procedure: INSERTION PACEMAKER-DUEL CHAMBER INITIAL IMPLANT;  Surgeon: Isaias Cowman, MD;  Location: [facility] ORS;  Service: Cardiovascular;  Laterality: N/A;  . SPINE SURGERY      There were no vitals filed for this visit.  Subjective Assessment - 12/20/19 1355    Subjective  Pt reports doing well. He feels like he enjoyed the [facility] training last session and want to continue. No additional updates.    Pertinent History  Pt is a 86 year old male who presents with imbalance and  difficulty with gait following a right frontal CVA with left hemiparesis on 05/22/19.  He has completed inpatient and home health PT, and now presents for OP PT.  He was discharged from inpatient rehab at a supervision level of assistance, ambulating with a RW.  His family notes that he is impulsive and will try to ambulate without his RW at home.  Family is currently providing 24/7 supervision.    Currently in Pain?  No/denies       Gait Training Gait training in hallway with single point cane in RUE. Pt is able to ambulate a full lap around downstairs of hospital approximately [phone].' No overt LOB requiring external support from therapist to prevent falling. No instability. Tends to stop AMB to talk. Good sequening in RUE with SPC without needed cues.    -Hands free STS 2x10 from chair  Standing exercises with 4# ankle weights: -Hip flexion marching with UE support x 15 bilateral; -lateral side stepping 1x59f bilat c SPC and minGuard to minA  -HS curls x 15 bilateral; -LAQ 1x15 needs extensive cues for form  -standing heel raises 1x20 bilat (height very limited)    PT Short Term Goals - 12/13/19 1613      PT SHORT TERM GOAL #1   Title  Pt will be independent with HEP in order to improve strength and balance in order to decrease fall risk and improve function at home and work.    Time  6    Period  Weeks    Status  On-going    Target Date  01/24/20        PT Long Term Goals - 12/13/19 1613      PT LONG TERM GOAL #1   Title  Pt will improve BERG to >50 in order to decrease his fall risk.    Baseline  09/21/19: 42/56, 11/03/19: 47/56; 12/13/19: 46/56    Time  12    Period  Weeks    Status  Partially Met    Target Date  03/06/20      PT LONG TERM GOAL #2   Title  Pt will decrease TUG to below 14 seconds/decrease in order to demonstrate decreased fall risk.    Baseline  09/21/19: 22.19s; 11/03/19: 19.0s; 12/13/19: 17.0s    Time  12    Period  Weeks    Status  Partially Met     Target Date  03/06/20      PT LONG TERM GOAL #3   Title  Pt will decrease 5TSTS by at least 3 seconds in order to demonstrate clinically significant improvement in LE strength.    Baseline  09/21/19: 32.28s; 11/03/19: 15.5s; 12/13/19: 14.2s    Time  12    Period  Weeks    Status  Achieved      PT LONG TERM GOAL #4   Title  Pt will increase his 10MWT to >1.0 m/s in order to demonstrate a clinically significant improvement in gait speed and improved community ambulation    Baseline  09/21/19: Self-selected: 20.9 s = 0.48 m/s; Fastest: 17.1s =0.58 m/s; 11/03/19: Self-selected: 17.7s s = 0.56 m/s; Fastest: 13.5s =0.74 m/s with walker; 12/13/19: Self-selected:  12.6s =  0.79 m/s; Fastest: 12.2 = 0.82 m/s with walker    Time  12    Period  Weeks    Status  Revised    Target Date  03/06/20            Plan - 12/20/19 1357    Clinical Impression Statement  Pt able to complete entire session as planned with rest breaks provided as needed. Pt requires occasional redirection to attend to task, especially during gait.  Extensive verbal, visual, and tactile cues are provided for most accurate form possible. Noted TKE ROM limitations bilat, but the left knee is the one with a chronic hisotry of instability- no giving way this session. Author provides minA intermittently for full ROM when needed. Overall pt continues to make steady progress toward treatment goals.    Rehab Potential  Good    PT Frequency  2x / week    PT Duration  12 weeks    PT Treatment/Interventions  ADLs/Self Care Home Management;Canalith Repostioning;Cryotherapy;Electrical Stimulation;Iontophoresis 30m/ml Dexamethasone;Moist Heat;Traction;Ultrasound;DME Instruction;Gait training;Stair training;Therapeutic activities;Therapeutic exercise;Functional mobility training;Balance training;Neuromuscular re-education;Patient/family education;Manual techniques;Dry needling;Vestibular;Joint Manipulations;Spinal Manipulations    PT Next Visit  Plan  progress strength and balance    PT Home Exercise Plan  Access Code: [email or web address]   Consulted and Agree with Plan of Care  Patient;Family member/caregiver       Patient will benefit from skilled therapeutic intervention in order to improve the following deficits and impairments:  Abnormal gait, Decreased balance, Decreased endurance, Decreased mobility, Difficulty walking, Decreased knowledge of precautions, Decreased safety awareness, Decreased activity tolerance,  Decreased strength, Hypermobility  Visit Diagnosis: Muscle weakness (generalized)  Decreased strength of lower extremity  Unsteadiness on feet     Problem List Patient Active Problem List   Diagnosis Date Noted  . Chronic venous insufficiency 12/05/2019  . 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 Great toe pain, right   . Lethargy   . CKD (chronic kidney disease), stage II   . Leukocytosis   . Benign essential HTN   . New onset atrial fibrillation ([place])   . Dysphagia, post-stroke   . 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 Acute ischemic right MCA stroke ([place]) 05/25/2019  . Goals of care, counseling/discussion   . Palliative care by specialist   . DNR (do not resuscitate) discussion   . Stroke, acute, embolic ([place]) 69/24/9324  . Persistent proteinuria 07/30/2018  . 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 Chronic kidney disease, stage III (moderate) 06/15/2018  . Leg pain 05/06/2018  . Lymphedema 05/06/2018  . Coagulopathy ([place]) 07/07/2017  . Lumbar spondylosis 03/10/2017  . Elevated uric acid in blood 11/20/2016  . BPH (benign prostatic hyperplasia) 05/29/2016  . Peripheral vascular disease of lower extremity ([place]) 03/03/2016  . Gallstone 12/31/2015  . 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 Atherosclerosis of aorta ([place]) 11/22/2015  . Carotid artery narrowing 11/22/2015  . Diverticulosis of colon 11/22/2015  . Decreased creatinine  clearance 11/22/2015  . Arthritis, degenerative 11/22/2015  . Lactose intolerance 11/22/2015  . Basal cell carcinoma 11/22/2015  . Essential hypertension 06/21/2015  . Hyperlipemia 06/21/2015  . GERD (gastroesophageal reflux disease) 06/21/2015  . Calculus of kidney 11/18/2013    2:26 PM, 12/20/19 Etta Grandchild, PT, DPT Physical Therapist - [facility]  Outpatient Physical [place] [phone]     Etta Grandchild 12/20/2019, 2:13 PM  [place] MAIN [facility] SERVICES [address] [place], [place], [address] Phone: [phone]   Fax:  [phone]  Name: CAMDAN BURDI MRN: [id number] Date of Birth: 1926/02/29

## 2019-12-22 ENCOUNTER — Encounter: Payer: Medicare Other | Admitting: Occupational Therapy

## 2019-12-22 ENCOUNTER — Ambulatory Visit: Payer: Medicare Other

## 2019-12-22 ENCOUNTER — Other Ambulatory Visit: Payer: Self-pay

## 2019-12-22 DIAGNOSIS — M6281 Muscle weakness (generalized): Secondary | ICD-10-CM | POA: Diagnosis not present

## 2019-12-22 DIAGNOSIS — R2681 Unsteadiness on feet: Secondary | ICD-10-CM

## 2019-12-22 NOTE — Therapy (Signed)
[place] MAIN [facility] SERVICES [address] [place], [place], [address] Phone: [phone]   Fax:  [phone]  Physical Therapy Treatment  Patient Details  Name: Jose Castro MRN: [id number] Date of Birth: Dec 27, 1925 Referring Provider (PT): Dr. Letta Pate   Encounter Date: 12/22/2019  PT End of Session - 12/22/19 1406    Visit Number  23    Number of Visits  49    Date for PT Re-Evaluation  03/06/20    PT Start Time  1400    PT Stop Time  1445    PT Time Calculation (min)  45 min    Equipment Utilized During Treatment  Gait belt    Activity Tolerance  Patient tolerated treatment well;No increased pain;Patient limited by fatigue    Behavior During Therapy  [facility] for tasks assessed/performed       Past Medical History:  Diagnosis Date  . Arthritis   . BPH (benign prostatic hyperplasia)   . Chronic kidney disease    had a kidney stone which per family was a cyst that was removed  . Dysrhythmia   . GERD (gastroesophageal reflux disease)   . History of kidney stones   . Hyperlipidemia   . Hypertension   . Splenic vein thrombosis   . Stroke ([place]) 05/22/2019  . Thrombosis 12/2015   mural  area and no notation of heart attack    Past Surgical History:  Procedure Laterality Date  . APPENDECTOMY    . CATARACT EXTRACTION, BILATERAL    . CYSTOSCOPY WITH INSERTION OF UROLIFT    . KIDNEY STONE SURGERY    . KNEE SURGERY    . PACEMAKER INSERTION N/A 07/13/2018   Procedure: INSERTION PACEMAKER-DUEL CHAMBER INITIAL IMPLANT;  Surgeon: Isaias Cowman, MD;  Location: [facility] ORS;  Service: Cardiovascular;  Laterality: N/A;  . SPINE SURGERY      There were no vitals filed for this visit.  Subjective Assessment - 12/22/19 1405    Subjective  Pt reports doing well. He denies any aches or pains upon arrival but does endorse some fatigue. No additional updates.    Pertinent History  Pt is a 86 year old male who presents with imbalance and  difficulty with gait following a right frontal CVA with left hemiparesis on 05/22/19.  He has completed inpatient and home health PT, and now presents for OP PT.  He was discharged from inpatient rehab at a supervision level of assistance, ambulating with a RW.  His family notes that he is impulsive and will try to ambulate without his RW at home.  Family is currently providing 24/7 supervision.    Currently in Pain?  No/denies            TREATMENT   Gait Training Gait training in hallway with single point cane in RUE. Pt is able to ambulate a full lap around downstairs of hospital approximately 1000.' Pt performs horizontal and vertical head turns with cues from therapist as well as gait speed changes. He demonstrates some lateral staggering when performing head turns as well as some inconsistent cane placement however improved from previous session. Practiced walking during head turns while talking to multiple staff members. No overt LOB requiring external support from therapist to prevent falling;   Therapeutic Exercise NuStep L2 x 5 minutes for warm-up during history, resistance adjusted to match fatigue and intensity (3 minutes unbilled); Seated hip flexion marching with manual resistance x 15 bilateral; Seated clams with manual resistance x 15; Seated adductor squeeze  with manual resistance x 15; Supine bridges x 10; Supine SLR x 10 bilateral; Supine SLR hip abduction with manual resistance x 10 bilateral; Supine SLR hip adduction with manual resistance x 10 bilateral; Sit to stand without UE support from regular height chair x 10; Side stepping in // bars without UE support x 2 each direction; Forward/backward stepping in // bars without UE support x 4 each direction;   Pt educated throughout session about proper posture and technique with exercises. Improved exercise technique, movement at target joints, use of target muscles after min to mod verbal, visual, tactile  cues.   Ptdemonstrates excellent motivation during session today. Performed gait training again today with patient in hallway using single point cane in RUE and he demonstrates improved balance and cane placement. He reports fatigue at beginning of session so more exercises performed in sitting and supine today compared to standing. Pt requires constant redirection during session due to distraction and conversation.Hewill benefit from skilled PT services to address deficits in balance and decrease risk for future falls.                     PT Short Term Goals - 12/13/19 1613      PT SHORT TERM GOAL #1   Title  Pt will be independent with HEP in order to improve strength and balance in order to decrease fall risk and improve function at home and work.    Time  6    Period  Weeks    Status  On-going    Target Date  01/24/20        PT Long Term Goals - 12/13/19 1613      PT LONG TERM GOAL #1   Title  Pt will improve BERG to >50 in order to decrease his fall risk.    Baseline  09/21/19: 42/56, 11/03/19: 47/56; 12/13/19: 46/56    Time  12    Period  Weeks    Status  Partially Met    Target Date  03/06/20      PT LONG TERM GOAL #2   Title  Pt will decrease TUG to below 14 seconds/decrease in order to demonstrate decreased fall risk.    Baseline  09/21/19: 22.19s; 11/03/19: 19.0s; 12/13/19: 17.0s    Time  12    Period  Weeks    Status  Partially Met    Target Date  03/06/20      PT LONG TERM GOAL #3   Title  Pt will decrease 5TSTS by at least 3 seconds in order to demonstrate clinically significant improvement in LE strength.    Baseline  09/21/19: 32.28s; 11/03/19: 15.5s; 12/13/19: 14.2s    Time  12    Period  Weeks    Status  Achieved      PT LONG TERM GOAL #4   Title  Pt will increase his 10MWT to >1.0 m/s in order to demonstrate a clinically significant improvement in gait speed and improved community ambulation    Baseline  09/21/19: Self-selected: 20.9 s  = 0.48 m/s; Fastest: 17.1s =0.58 m/s; 11/03/19: Self-selected: 17.7s s = 0.56 m/s; Fastest: 13.5s =0.74 m/s with walker; 12/13/19: Self-selected:  12.6s =  0.79 m/s; Fastest: 12.2 = 0.82 m/s with walker    Time  12    Period  Weeks    Status  Revised    Target Date  03/06/20            Plan - 12/22/19 1452  Clinical Impression Statement  Pt demonstrates excellent motivation during session today. Performed gait training again today with patient in hallway using single point cane in RUE and he demonstrates improved balance and cane placement. He reports fatigue at beginning of session so more exercises performed in sitting and supine today compared to standing. Pt requires constant redirection during session due to distraction and conversation. He will benefit from skilled PT services to address deficits in balance and decrease risk for future falls.    Personal Factors and Comorbidities  Age;Comorbidity 2    Comorbidities  HTN, CVA    Examination-Activity Limitations  Bend;Lift;Squat;Locomotion Level;Stairs;Stand    Examination-Participation Restrictions  Community Activity;Driving    Stability/Clinical Decision Making  Evolving/Moderate complexity    Clinical Decision Making  Moderate    Rehab Potential  Good    PT Frequency  2x / week    PT Duration  12 weeks    PT Treatment/Interventions  ADLs/Self Care Home Management;Canalith Repostioning;Cryotherapy;Electrical Stimulation;Iontophoresis 69m/ml Dexamethasone;Moist Heat;Traction;Ultrasound;DME Instruction;Gait training;Stair training;Therapeutic activities;Therapeutic exercise;Functional mobility training;Balance training;Neuromuscular re-education;Patient/family education;Manual techniques;Dry needling;Vestibular;Joint Manipulations;Spinal Manipulations    PT Next Visit Plan  progress strength and balance as well as gait with spc    PT Home Exercise Plan  Access Code: [email or web address]   Consulted and Agree with Plan of Care  Patient;Family  member/caregiver       Patient will benefit from skilled therapeutic intervention in order to improve the following deficits and impairments:  Abnormal gait, Decreased balance, Decreased endurance, Decreased mobility, Difficulty walking, Decreased knowledge of precautions, Decreased safety awareness, Decreased activity tolerance, Decreased strength, Hypermobility  Visit Diagnosis: Muscle weakness (generalized)  Unsteadiness on feet     Problem List Patient Active Problem List   Diagnosis Date Noted  . Chronic venous insufficiency 12/05/2019  . 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 Great toe pain, right   . Lethargy   . CKD (chronic kidney disease), stage II   . Leukocytosis   . Benign essential HTN   . New onset atrial fibrillation ([place]   . Dysphagia, post-stroke   . Right middle cerebral artery stroke ([place] 05/27/2019  . Acute ischemic right MCA stroke ([place] 05/25/2019  . Goals of care, counseling/discussion   . Palliative care by specialist   . DNR (do not resuscitate) discussion   . Stroke, acute, embolic ([place] 060/08/9322 . Persistent proteinuria 07/30/2018  . 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 Chronic kidney disease, stage III (moderate) 06/15/2018  . Leg pain 05/06/2018  . Lymphedema 05/06/2018  . Coagulopathy ([place] 07/07/2017  . Lumbar spondylosis 03/10/2017  . Elevated uric acid in blood 11/20/2016  . BPH (benign prostatic hyperplasia) 05/29/2016  . Peripheral vascular disease of lower extremity ([place] 03/03/2016  . Gallstone 12/31/2015  . 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 Atherosclerosis of aorta ([place] 11/22/2015  . Carotid artery narrowing 11/22/2015  . Diverticulosis of colon 11/22/2015  . Decreased creatinine clearance 11/22/2015  . Arthritis, degenerative 11/22/2015  . Lactose intolerance 11/22/2015  . Basal cell carcinoma 11/22/2015  . Essential hypertension 06/21/2015  . Hyperlipemia  06/21/2015  . GERD (gastroesophageal reflux disease) 06/21/2015  . Calculus of kidney 11/18/2013   JPhillips GroutPT, DPT, GCS  Huprich,Jason 12/23/2019, 8:19 AM  [place] [facility] [address] [address]Phone: [phone]  Fax:  [phone] Name: Jose PAKMRN: [id number] of Birth:  02-21-1926

## 2019-12-27 ENCOUNTER — Ambulatory Visit: Payer: Medicare Other | Admitting: Occupational Therapy

## 2019-12-29 ENCOUNTER — Ambulatory Visit: Payer: Medicare Other

## 2019-12-29 ENCOUNTER — Encounter: Payer: Medicare Other | Admitting: Occupational Therapy

## 2019-12-29 ENCOUNTER — Other Ambulatory Visit: Payer: Self-pay

## 2019-12-29 DIAGNOSIS — R2681 Unsteadiness on feet: Secondary | ICD-10-CM

## 2019-12-29 DIAGNOSIS — M6281 Muscle weakness (generalized): Secondary | ICD-10-CM

## 2019-12-29 NOTE — Therapy (Signed)
[place] MAIN [facility] SERVICES [address] [place], [place], [address] Phone: [phone]   Fax:  [phone]  Physical Therapy Treatment  Patient Details  Name: Jose Castro MRN: [id number] Date of Birth: 18-Aug-1926 Referring Provider (PT): Dr. Letta Pate   Encounter Date: 12/29/2019  PT End of Session - 12/29/19 1720    Visit Number  24    Number of Visits  49    Date for PT Re-Evaluation  03/06/20    PT Start Time  5643    PT Stop Time  1430    PT Time Calculation (min)  35 min    Equipment Utilized During Treatment  Gait belt    Activity Tolerance  Patient tolerated treatment well;Patient limited by pain   L knee pain   Behavior During Therapy  [facility] for tasks assessed/performed       Past Medical History:  Diagnosis Date  . Arthritis   . BPH (benign prostatic hyperplasia)   . Chronic kidney disease    had a kidney stone which per family was a cyst that was removed  . Dysrhythmia   . GERD (gastroesophageal reflux disease)   . History of kidney stones   . Hyperlipidemia   . Hypertension   . Splenic vein thrombosis   . Stroke ([place]) 05/22/2019  . Thrombosis 12/2015   mural  area and no notation of heart attack    Past Surgical History:  Procedure Laterality Date  . APPENDECTOMY    . CATARACT EXTRACTION, BILATERAL    . CYSTOSCOPY WITH INSERTION OF UROLIFT    . KIDNEY STONE SURGERY    . KNEE SURGERY    . PACEMAKER INSERTION N/A 07/13/2018   Procedure: INSERTION PACEMAKER-DUEL CHAMBER INITIAL IMPLANT;  Surgeon: Isaias Cowman, MD;  Location: [facility] ORS;  Service: Cardiovascular;  Laterality: N/A;  . SPINE SURGERY      There were no vitals filed for this visit.  Subjective Assessment - 12/29/19 1407    Subjective  Pt reports doing well. He denies any aches or pains upon arrival and no falls. His L knee continues to give him issues but no resting pain currently. No additional updates.    Pertinent History  Pt is a  86 year old male who presents with imbalance and difficulty with gait following a right frontal CVA with left hemiparesis on 05/22/19.  He has completed inpatient and home health PT, and now presents for OP PT.  He was discharged from inpatient rehab at a supervision level of assistance, ambulating with a RW.  His family notes that he is impulsive and will try to ambulate without his RW at home.  Family is currently providing 24/7 supervision.    Limitations  House hold activities;Walking    How long can you sit comfortably?  no limitations    How long can you walk comfortably?  he requires a RW at home    Patient Stated Goals  get back to normal; want to be able to play golf and go out to play trivia again.  His daughters want him to regain independence with going to the bathroom at night    Currently in Pain?  No/denies          TREATMENT   Gait Training Gait training in hallway withsingle point canein RUE. Pt is able to ambulate extensively around downstairs ofhospitalapproximately 1500' today which is considerably farther than during previous session. Pt performs horizontal and vertical head turns with cues from therapist as well  as gait speed changes. Pt also performs quick stops. He is able to ambulate approximately 500' of the distance without the cane. He demonstrates some lateral staggering when performing head turns especially when ambulating without the single point cane. No overt LOB requiring external support from therapist to prevent falling during gait today;   Neuromuscular Re-education  Airex WBOS and then NBOS basketball toss without UE support; 6" orangle hurdle steps without UE support x 10 leading with LLE, x 4 leading with RLE but discontinued due to progressively worsening L knee pain in single leg stance; Forward/backward stepping in // bars without UE support x 4 each direction; Side stepping in // bars without UE support x 2 each direction, discontinued  secondary to L knee pain;   Therapeutic Exercise Seated hip flexion marchingwith manual resistancex 15 bilateral; Seated clams with manual resistance x 15; Seated adductor squeeze with manual resistance x 15; Seated heel raises with manual resistance from therapist x 15;   Pt educated throughout session about proper posture and technique with exercises. Improved exercise technique, movement at target joints, use of target muscles after min to mod verbal, visual, tactile cues.   Ptdemonstratesexcellent motivation during session today. He arrived late so session was slightly abbreviated accordingly. Performed gait trainingagain today with patientin hallway. He is able to ambulate extensively around downstairs ofhospitalapproximately 1500' today which is considerably farther than during previous session. Approximately 500' of the distance is performed without the single point cane. Balance exercises are somewhat limited due to increase in L knee pain when in single leg stance. Pt will benefit from skilled PT services to address deficits in balance and decrease risk for future falls.                       PT Short Term Goals - 12/13/19 1613      PT SHORT TERM GOAL #1   Title  Pt will be independent with HEP in order to improve strength and balance in order to decrease fall risk and improve function at home and work.    Time  6    Period  Weeks    Status  On-going    Target Date  01/24/20        PT Long Term Goals - 12/13/19 1613      PT LONG TERM GOAL #1   Title  Pt will improve BERG to >50 in order to decrease his fall risk.    Baseline  09/21/19: 42/56, 11/03/19: 47/56; 12/13/19: 46/56    Time  12    Period  Weeks    Status  Partially Met    Target Date  03/06/20      PT LONG TERM GOAL #2   Title  Pt will decrease TUG to below 14 seconds/decrease in order to demonstrate decreased fall risk.    Baseline  09/21/19: 22.19s; 11/03/19: 19.0s; 12/13/19:  17.0s    Time  12    Period  Weeks    Status  Partially Met    Target Date  03/06/20      PT LONG TERM GOAL #3   Title  Pt will decrease 5TSTS by at least 3 seconds in order to demonstrate clinically significant improvement in LE strength.    Baseline  09/21/19: 32.28s; 11/03/19: 15.5s; 12/13/19: 14.2s    Time  12    Period  Weeks    Status  Achieved      PT LONG TERM GOAL #4  Title  Pt will increase his 10MWT to >1.0 m/s in order to demonstrate a clinically significant improvement in gait speed and improved community ambulation    Baseline  09/21/19: Self-selected: 20.9 s = 0.48 m/s; Fastest: 17.1s =0.58 m/s; 11/03/19: Self-selected: 17.7s s = 0.56 m/s; Fastest: 13.5s =0.74 m/s with walker; 12/13/19: Self-selected:  12.6s =  0.79 m/s; Fastest: 12.2 = 0.82 m/s with walker    Time  12    Period  Weeks    Status  Revised    Target Date  03/06/20            Plan - 12/29/19 1720    Clinical Impression Statement  Pt demonstrates excellent motivation during session today. He arrived late so session was slightly abbreviated accordingly. Performed gait training again today with patient in hallway. He is able to ambulate extensively around downstairs of hospital approximately 1500' today which is considerably farther than during previous session. Approximately 500' of the distance is performed without the single point cane. Balance exercises are somewhat limited due to increase in L knee pain when in single leg stance. Pt will benefit from skilled PT services to address deficits in balance and decrease risk for future falls.    Personal Factors and Comorbidities  Age;Comorbidity 2    Comorbidities  HTN, CVA    Examination-Activity Limitations  Bend;Lift;Squat;Locomotion Level;Stairs;Stand    Examination-Participation Restrictions  Community Activity;Driving    Stability/Clinical Decision Making  Evolving/Moderate complexity    Rehab Potential  Good    PT Frequency  2x / week    PT Duration   12 weeks    PT Treatment/Interventions  ADLs/Self Care Home Management;Canalith Repostioning;Cryotherapy;Electrical Stimulation;Iontophoresis 60m/ml Dexamethasone;Moist Heat;Traction;Ultrasound;DME Instruction;Gait training;Stair training;Therapeutic activities;Therapeutic exercise;Functional mobility training;Balance training;Neuromuscular re-education;Patient/family education;Manual techniques;Dry needling;Vestibular;Joint Manipulations;Spinal Manipulations    PT Next Visit Plan  progress strength and balance as well as gait with spc    PT Home Exercise Plan  Access Code: [email or web address]   Consulted and Agree with Plan of Care  Patient;Family member/caregiver       Patient will benefit from skilled therapeutic intervention in order to improve the following deficits and impairments:  Abnormal gait, Decreased balance, Decreased endurance, Decreased mobility, Difficulty walking, Decreased knowledge of precautions, Decreased safety awareness, Decreased activity tolerance, Decreased strength, Hypermobility  Visit Diagnosis: Muscle weakness (generalized)  Unsteadiness on feet     Problem List Patient Active Problem List   Diagnosis Date Noted  . Chronic venous insufficiency 12/05/2019  . 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 Great toe pain, right   . Lethargy   . CKD (chronic kidney disease), stage II   . Leukocytosis   . Benign essential HTN   . New onset atrial fibrillation ([place]   . Dysphagia, post-stroke   . Right middle cerebral artery stroke (HN[place] 05/27/2019  . Acute ischemic right MCA stroke (HSabana [place]ne[place] 05/25/2019  . Goals of care, counseling/discussion   . Palliative care by specialist   . DNR (do not resuscitate) discussion   . Stroke, acute, embolic ([place] 088/50/2774 . Persistent proteinuria 07/30/2018  . 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 Chronic kidney disease, stage III (moderate) 06/15/2018  . Leg pain 05/06/2018  . Lymphedema 05/06/2018  .  Coagulopathy ([place] 07/07/2017  . Lumbar spondylosis 03/10/2017  . Elevated uric acid in blood 11/20/2016  . BPH (benign prostatic hyperplasia) 05/29/2016  . Peripheral vascular disease of lower extremity (HSolen 03/03/2016  . Gallstone 12/31/2015  .  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 Atherosclerosis of aorta ([place]) 11/22/2015  . Carotid artery narrowing 11/22/2015  . Diverticulosis of colon 11/22/2015  . Decreased creatinine clearance 11/22/2015  . Arthritis, degenerative 11/22/2015  . Lactose intolerance 11/22/2015  . Basal cell carcinoma 11/22/2015  . Essential hypertension 06/21/2015  . Hyperlipemia 06/21/2015  . GERD (gastroesophageal reflux disease) 06/21/2015  . Calculus of kidney 11/18/2013   Phillips Grout PT, DPT, GCS  , 12/29/2019, 5:27 PM  [place] MAIN [facility] SERVICES [address] [place], [place], [address] Phone: [phone]   Fax:  [phone]  Name: Jose Castro MRN: [id number] Date of Birth: February 07, 1926

## 2020-01-03 ENCOUNTER — Ambulatory Visit: Payer: Medicare Other | Admitting: Occupational Therapy

## 2020-01-03 ENCOUNTER — Encounter: Payer: Self-pay | Admitting: Occupational Therapy

## 2020-01-03 ENCOUNTER — Other Ambulatory Visit: Payer: Self-pay

## 2020-01-03 ENCOUNTER — Ambulatory Visit: Payer: Medicare Other | Attending: Physical Medicine & Rehabilitation

## 2020-01-03 DIAGNOSIS — R29898 Other symptoms and signs involving the musculoskeletal system: Secondary | ICD-10-CM | POA: Insufficient documentation

## 2020-01-03 DIAGNOSIS — R5383 Other fatigue: Secondary | ICD-10-CM | POA: Diagnosis present

## 2020-01-03 DIAGNOSIS — M6281 Muscle weakness (generalized): Secondary | ICD-10-CM | POA: Diagnosis present

## 2020-01-03 DIAGNOSIS — I63511 Cerebral infarction due to unspecified occlusion or stenosis of right middle cerebral artery: Secondary | ICD-10-CM | POA: Insufficient documentation

## 2020-01-03 DIAGNOSIS — R2681 Unsteadiness on feet: Secondary | ICD-10-CM | POA: Diagnosis present

## 2020-01-03 DIAGNOSIS — R278 Other lack of coordination: Secondary | ICD-10-CM | POA: Diagnosis present

## 2020-01-03 DIAGNOSIS — R2689 Other abnormalities of gait and mobility: Secondary | ICD-10-CM | POA: Diagnosis present

## 2020-01-03 NOTE — Therapy (Signed)
[place] MAIN [facility] SERVICES [address] [place], [place], [address] Phone: [phone]   Fax:  [phone]  Physical Therapy Treatment  Patient Details  Name: Jose Castro MRN: [id number] Date of Birth: 1925/12/04 Referring Provider (PT): Dr. Letta Pate   Encounter Date: 01/03/2020  PT End of Session - 01/03/20 1443    Visit Number  25    Number of Visits  49    Date for PT Re-Evaluation  03/06/20    PT Start Time  1435    PT Stop Time  1515    PT Time Calculation (min)  40 min    Equipment Utilized During Treatment  Gait belt    Activity Tolerance  Patient tolerated treatment well;Patient limited by pain   L knee pain   Behavior During Therapy  [facility] for tasks assessed/performed       Past Medical History:  Diagnosis Date  . Arthritis   . BPH (benign prostatic hyperplasia)   . Chronic kidney disease    had a kidney stone which per family was a cyst that was removed  . Dysrhythmia   . GERD (gastroesophageal reflux disease)   . History of kidney stones   . Hyperlipidemia   . Hypertension   . Splenic vein thrombosis   . Stroke ([place]) 05/22/2019  . Thrombosis 12/2015   mural  area and no notation of heart attack    Past Surgical History:  Procedure Laterality Date  . APPENDECTOMY    . CATARACT EXTRACTION, BILATERAL    . CYSTOSCOPY WITH INSERTION OF UROLIFT    . KIDNEY STONE SURGERY    . KNEE SURGERY    . PACEMAKER INSERTION N/A 07/13/2018   Procedure: INSERTION PACEMAKER-DUEL CHAMBER INITIAL IMPLANT;  Surgeon: Isaias Cowman, MD;  Location: [facility] ORS;  Service: Cardiovascular;  Laterality: N/A;  . SPINE SURGERY      There were no vitals filed for this visit.  Subjective Assessment - 01/03/20 1442    Subjective  Pt reports doing well. He denies any aches or pains upon arrival with the exception of his chronic L knee pain. Reports 1 stumble since last visit but denies falls. No additional updates at this time. No specific  questions or concerns currently.    Pertinent History  Pt is a 86 year old male who presents with imbalance and difficulty with gait following a right frontal CVA with left hemiparesis on 05/22/19.  He has completed inpatient and home health PT, and now presents for OP PT.  He was discharged from inpatient rehab at a supervision level of assistance, ambulating with a RW.  His family notes that he is impulsive and will try to ambulate without his RW at home.  Family is currently providing 24/7 supervision.    Limitations  House hold activities;Walking    How long can you sit comfortably?  no limitations    How long can you walk comfortably?  he requires a RW at home    Patient Stated Goals  get back to normal; want to be able to play golf and go out to play trivia again.  His daughters want him to regain independence with going to the bathroom at night    Currently in Pain?  No/denies          TREATMENT     Neuromuscular Re-education  Dynamic balance during ambulation with single point cane approximately 300' today. Pt performed horizontal and vertical head turns with cues from therapist as well as gait  speed changes. Pt also performs quick stops as well as multiple bouts of quick 180 degree turns in each direction. He demonstrates one severe lateral stagger requiring modA+1 from therapist to corrrect and prevent fall but otherwise he is stable. 6" alternating step taps without UE support x 10 each; NBOS eyes closed x 45s; NBOS horizontal and vertical head turns x 30s each; Airex 6" alternating step taps without UE support x 10 each; Four square stepping x 3 clockwise and x 3 counterclockwise;   Therapeutic Exercise 6" step-ups leading with RLE up and LLE down to minimize L knee pain x 10; Seated HS stretch 30s x 2 bilateral; Seated hip flexion marchingwithmanual resistancex 15 bilateral; Seated clams with manual resistance x 15; Seated adductor squeeze with manual resistance x  15; Standing heel raises x 15;   Pt educated throughout session about proper posture and technique with exercises. Improved exercise technique, movement at target joints, use of target muscles after min to mod verbal, visual, tactile cues.   Ptdemonstratesexcellent motivation during session today. He is able to again perform dynamic balance activities during gait. He continues to struggle on unstable surfaces and is somewhat limited by L knee pain. Continued with seated and standing strengthening today. Repeated four square stepping which he has not performed in a few sessions.  Pt encouraged to continue HEP and follow-up as scheduled. Pt will benefit from skilled PT services to address deficits in balance and decrease risk for future falls.                        PT Short Term Goals - 12/13/19 1613      PT SHORT TERM GOAL #1   Title  Pt will be independent with HEP in order to improve strength and balance in order to decrease fall risk and improve function at home and work.    Time  6    Period  Weeks    Status  On-going    Target Date  01/24/20        PT Long Term Goals - 12/13/19 1613      PT LONG TERM GOAL #1   Title  Pt will improve BERG to >50 in order to decrease his fall risk.    Baseline  09/21/19: 42/56, 11/03/19: 47/56; 12/13/19: 46/56    Time  12    Period  Weeks    Status  Partially Met    Target Date  03/06/20      PT LONG TERM GOAL #2   Title  Pt will decrease TUG to below 14 seconds/decrease in order to demonstrate decreased fall risk.    Baseline  09/21/19: 22.19s; 11/03/19: 19.0s; 12/13/19: 17.0s    Time  12    Period  Weeks    Status  Partially Met    Target Date  03/06/20      PT LONG TERM GOAL #3   Title  Pt will decrease 5TSTS by at least 3 seconds in order to demonstrate clinically significant improvement in LE strength.    Baseline  09/21/19: 32.28s; 11/03/19: 15.5s; 12/13/19: 14.2s    Time  12    Period  Weeks    Status   Achieved      PT LONG TERM GOAL #4   Title  Pt will increase his 10MWT to >1.0 m/s in order to demonstrate a clinically significant improvement in gait speed and improved community ambulation    Baseline  09/21/19: Self-selected: 20.9  s = 0.48 m/s; Fastest: 17.1s =0.58 m/s; 11/03/19: Self-selected: 17.7s s = 0.56 m/s; Fastest: 13.5s =0.74 m/s with walker; 12/13/19: Self-selected:  12.6s =  0.79 m/s; Fastest: 12.2 = 0.82 m/s with walker    Time  12    Period  Weeks    Status  Revised    Target Date  03/06/20            Plan - 01/03/20 1444    Clinical Impression Statement  Pt demonstrates excellent motivation during session today. He is able to again perform dynamic balance activities during gait. He continues to struggle on unstable surfaces and is somewhat limited by L knee pain. Continued with seated and standing strengthening today. Repeated four square stepping which he has not performed in a few sessions.  Pt encouraged to continue HEP and follow-up as scheduled. Pt will benefit from skilled PT services to address deficits in balance and decrease risk for future falls.    Personal Factors and Comorbidities  Age;Comorbidity 2    Comorbidities  HTN, CVA    Examination-Activity Limitations  Bend;Lift;Squat;Locomotion Level;Stairs;Stand    Examination-Participation Restrictions  Community Activity;Driving    Stability/Clinical Decision Making  Evolving/Moderate complexity    Rehab Potential  Good    PT Frequency  2x / week    PT Duration  12 weeks    PT Treatment/Interventions  ADLs/Self Care Home Management;Canalith Repostioning;Cryotherapy;Electrical Stimulation;Iontophoresis '4mg'$ /ml Dexamethasone;Moist Heat;Traction;Ultrasound;DME Instruction;Gait training;Stair training;Therapeutic activities;Therapeutic exercise;Functional mobility training;Balance training;Neuromuscular re-education;Patient/family education;Manual techniques;Dry needling;Vestibular;Joint Manipulations;Spinal  Manipulations    PT Next Visit Plan  progress strength and balance as well as gait with spc    PT Home Exercise Plan  Access Code: [email or web address]    Consulted and Agree with Plan of Care  Patient;Family member/caregiver       Patient will benefit from skilled therapeutic intervention in order to improve the following deficits and impairments:  Abnormal gait, Decreased balance, Decreased endurance, Decreased mobility, Difficulty walking, Decreased knowledge of precautions, Decreased safety awareness, Decreased activity tolerance, Decreased strength, Hypermobility  Visit Diagnosis: Muscle weakness (generalized)  Unsteadiness on feet     Problem List Patient Active Problem List   Diagnosis Date Noted  . Chronic venous insufficiency 12/05/2019  . 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 Great toe pain, right   . Lethargy   . CKD (chronic kidney disease), stage II   . Leukocytosis   . Benign essential HTN   . New onset atrial fibrillation ([place])   . Dysphagia, post-stroke   . 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 Acute ischemic right MCA stroke ([place]) 05/25/2019  . Goals of care, counseling/discussion   . Palliative care by specialist   . DNR (do not resuscitate) discussion   . Stroke, acute, embolic ([place]) 33/29/5188  . Persistent proteinuria 07/30/2018  . 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 Chronic kidney disease, stage III (moderate) 06/15/2018  . Leg pain 05/06/2018  . Lymphedema 05/06/2018  . Coagulopathy ([place]) 07/07/2017  . Lumbar spondylosis 03/10/2017  . Elevated uric acid in blood 11/20/2016  . BPH (benign prostatic hyperplasia) 05/29/2016  . Peripheral vascular disease of lower extremity ([place]) 03/03/2016  . Gallstone 12/31/2015  . 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 Atherosclerosis of aorta ([place]) 11/22/2015  . Carotid artery narrowing 11/22/2015  . Diverticulosis of colon 11/22/2015  . Decreased  creatinine clearance 11/22/2015  . Arthritis, degenerative 11/22/2015  . Lactose intolerance 11/22/2015  .  Basal cell carcinoma 11/22/2015  . Essential hypertension 06/21/2015  . Hyperlipemia 06/21/2015  . GERD (gastroesophageal reflux disease) 06/21/2015  . Calculus of kidney 11/18/2013   Phillips Grout PT, DPT, GCS  Lorynn Moeser 01/03/2020, 4:17 PM  [place] MAIN [facility] SERVICES [address] [place], [place], [address] Phone: [phone]   Fax:  [phone]  Name: TAYVON CULLEY MRN: [id number] Date of Birth: February 11, 1926

## 2020-01-03 NOTE — Therapy (Signed)
[place] MAIN [facility] SERVICES [address], [place], [address] Phone: [phone]   Fax:  [phone]  Occupational Therapy Treatment  Patient Details  Name: Jose Castro MRN: [id number] Date of Birth: 04/01/1926 Referring Provider (OT): Steele Sizer   Encounter Date: 01/03/2020  OT End of Session - 01/03/20 1530    Visit Number  21    Number of Visits  26    Date for OT Re-Evaluation  01/27/20    Authorization Type  progress report from 12/13/2019    OT Start Time  1515    OT Stop Time  1600    OT Time Calculation (min)  45 min    Activity Tolerance  Patient tolerated treatment well    Behavior During Therapy  [facility] for tasks assessed/performed       Past Medical History:  Diagnosis Date  . Arthritis   . BPH (benign prostatic hyperplasia)   . Chronic kidney disease    had a kidney stone which per family was a cyst that was removed  . Dysrhythmia   . GERD (gastroesophageal reflux disease)   . History of kidney stones   . Hyperlipidemia   . Hypertension   . Splenic vein thrombosis   . Stroke ([place]) 05/22/2019  . Thrombosis 12/2015   mural  area and no notation of heart attack    Past Surgical History:  Procedure Laterality Date  . APPENDECTOMY    . CATARACT EXTRACTION, BILATERAL    . CYSTOSCOPY WITH INSERTION OF UROLIFT    . KIDNEY STONE SURGERY    . KNEE SURGERY    . PACEMAKER INSERTION N/A 07/13/2018   Procedure: INSERTION PACEMAKER-DUEL CHAMBER INITIAL IMPLANT;  Surgeon: Isaias Cowman, MD;  Location: [facility] ORS;  Service: Cardiovascular;  Laterality: N/A;  . SPINE SURGERY      There were no vitals filed for this visit.  Subjective Assessment - 01/03/20 1527    Subjective   Pt. has new hearing aides.    Pertinent History  Pt. is a 86 y.o. male who was admitted to [facility] with a [facility]stribution Infarct on 05/21/2019. Pt. went through Inpatient Rehab, and had home health OT, PT, and ST services.  Pt. was previously residing home alone, and was independent with ADLs, and IADLs. Pt. now has family staying with the pt. around the clock.    Currently in Pain?  No/denies      OT TREATMENT    Therapeutic Exercise:  Pt. worked on the [employer] for 8 min. In the forward motion with constant monitoring of the BUEs. Pt. performed 5# dowel ex. For UE strengthening secondary to weakness. Bilateral shoulder flexion, chest press, circular patterns. 4# dumbbell ex. for elbow flexion and extension,  2# for forearm supination/pronation, wrist extension. Pt. requires verbal cues, and visual demonstration for proper technique. Pt. performed left gross gripping with grip strengthener. Pt. worked on sustaining grip while grasping pegs and reaching at various heights. The Gripper was placed at 17.9# x's 2 reps. Pt. worked on the yellow resistive digiFlex, increased to the blue resistive digiFlex.   Response to Treatment:  Pt. has new hearing aides. Pt. reports that his son is visiting from [place]. Pt. requires cues for proper reps, and proper ther. ex. Form., as well a visual demonstration. Pt. continues to work on improving BUE strength, left grip strength, pinch strength, and [place] skills in order to improve UE functioning during ADLs, and IADL tasks.  OT Education - 01/03/20 1529    Education Details  LUE strength    Person(s) Educated  Patient    Methods  Explanation;Demonstration    Comprehension  Verbalized understanding;Returned demonstration;Verbal cues required          OT Long Term Goals - 12/16/19 1444      OT LONG TERM GOAL #1   Title  Pt. will demonstrate home management tasks with supervision initating safe strategies    Baseline  Eval: Decreased safety awareness, 12/13/19 min assist with home management tasks    Time  12    Period  Weeks    Status  On-going    Target Date  01/27/20      OT LONG TERM GOAL #2   Title  Pt. will be able to  identify 100% of potential safety hazards in a simulated home environment during ADLs, and IADLs.    Baseline  Eval: Limited safety awareness    Time  12    Period  Weeks    Status  Achieved      OT LONG TERM GOAL #3   Title  Pt. will increase left grip strength by 5# of force to assist with holding items duirng ADLs, and IADLs.    Baseline  Eval: decreased left grip strength, 12/13/19 increased by 4#    Time  12    Period  Weeks    Status  On-going    Target Date  01/27/20      OT LONG TERM GOAL #4   Title  Pt. will improve left hand [facility] skills by 3 sec of speed to be able to manipulate objects during ADLs, and IADL tasks.    Baseline  improved by 2 secs 12/13/19    Time  12    Period  Weeks    Status  On-going    Target Date  01/27/20      OT LONG TERM GOAL #5   Title  Pt. will demonstrate visual compensatory strategies as needed during ADLs, and IADL tasks.    Baseline  Eval: Limited left sided awareness, 12/13/19 improved but continues to require some min cues with IADL tasks    Time  12    Period  Weeks    Status  On-going    Target Date  01/27/20      OT LONG TERM GOAL #6   Title  Pt. will demonstrate cognitive compensatory strategies during ADLs, and IADLs    Time  12    Period  Weeks    Status  Achieved            Plan - 01/03/20 1531    Clinical Impression Statement  Pt. has new hearing aides. Pt. reports that his son is visiting from [place]. Pt. requires cues for proper reps, and proper ther. ex. form. Pt. continues to work on improving BUE strength, left grip strength, pinch strength, and [place] skills in order to improve UE functioning during ADLs, and IADL tasks.    OT Occupational Profile and History  Detailed Assessment- Review of Records and additional review of physical, cognitive, psychosocial history related to current functional performance    Occupational performance deficits (Please refer to evaluation for details):  ADL's;IADL's    Body Structure /  Function / Physical Skills  ADL;FMC;ROM;IADL;Strength;Decreased knowledge of use of DME;UE functional use    Rehab Potential  Excellent    Clinical Decision Making  Several treatment options, min-mod task modification necessary  Comorbidities Affecting Occupational Performance:  May have comorbidities impacting occupational performance    Modification or Assistance to Complete Evaluation   Min-Moderate modification of tasks or assist with assess necessary to complete eval    OT Frequency  1x / week    OT Duration  6 weeks    OT Treatment/Interventions  Self-care/ADL training;DME and/or AE instruction;Therapeutic exercise;Neuromuscular education;Patient/family education;Therapeutic activities    Consulted and Agree with Plan of Care  Patient       Patient will benefit from skilled therapeutic intervention in order to improve the following deficits and impairments:   Body Structure / Function / Physical Skills: ADL, FMC, ROM, IADL, Strength, Decreased knowledge of use of DME, UE functional use       Visit Diagnosis: Muscle weakness (generalized)  Other lack of coordination    Problem List Patient Active Problem List   Diagnosis Date Noted  . Chronic venous insufficiency 12/05/2019  . 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 Great toe pain, right   . Lethargy   . CKD (chronic kidney disease), stage II   . Leukocytosis   . Benign essential HTN   . New onset atrial fibrillation ([place])   . Dysphagia, post-stroke   . 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 Acute ischemic right MCA stroke ([place]) 05/25/2019  . Goals of care, counseling/discussion   . Palliative care by specialist   . DNR (do not resuscitate) discussion   . Stroke, acute, embolic ([place]) 40/81/4481  . Persistent proteinuria 07/30/2018  . 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 Chronic kidney disease, stage III (moderate) 06/15/2018  . Leg pain 05/06/2018  . Lymphedema  05/06/2018  . Coagulopathy ([place]) 07/07/2017  . Lumbar spondylosis 03/10/2017  . Elevated uric acid in blood 11/20/2016  . BPH (benign prostatic hyperplasia) 05/29/2016  . Peripheral vascular disease of lower extremity ([place]) 03/03/2016  . Gallstone 12/31/2015  . 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 Atherosclerosis of aorta ([place]) 11/22/2015  . Carotid artery narrowing 11/22/2015  . Diverticulosis of colon 11/22/2015  . Decreased creatinine clearance 11/22/2015  . Arthritis, degenerative 11/22/2015  . Lactose intolerance 11/22/2015  . Basal cell carcinoma 11/22/2015  . Essential hypertension 06/21/2015  . Hyperlipemia 06/21/2015  . GERD (gastroesophageal reflux disease) 06/21/2015  . Calculus of kidney 11/18/2013    Harrel Carina, MS, OTR/L 01/03/2020, 5:48 PM  [place] MAIN [facility] SERVICES [address] [place], [place], [address] Phone: [phone]   Fax:  [phone]  Name: Jose Castro MRN: [id number] Date of Birth: 10-May-1926

## 2020-01-05 ENCOUNTER — Other Ambulatory Visit: Payer: Self-pay

## 2020-01-05 ENCOUNTER — Encounter: Payer: Medicare Other | Admitting: Occupational Therapy

## 2020-01-05 ENCOUNTER — Ambulatory Visit: Payer: Medicare Other | Admitting: Physical Therapy

## 2020-01-05 ENCOUNTER — Encounter: Payer: Self-pay | Admitting: Physical Therapy

## 2020-01-05 DIAGNOSIS — R2681 Unsteadiness on feet: Secondary | ICD-10-CM

## 2020-01-05 DIAGNOSIS — R29898 Other symptoms and signs involving the musculoskeletal system: Secondary | ICD-10-CM

## 2020-01-05 DIAGNOSIS — I63511 Cerebral infarction due to unspecified occlusion or stenosis of right middle cerebral artery: Secondary | ICD-10-CM

## 2020-01-05 DIAGNOSIS — R278 Other lack of coordination: Secondary | ICD-10-CM

## 2020-01-05 DIAGNOSIS — M6281 Muscle weakness (generalized): Secondary | ICD-10-CM | POA: Diagnosis not present

## 2020-01-05 DIAGNOSIS — R2689 Other abnormalities of gait and mobility: Secondary | ICD-10-CM

## 2020-01-05 DIAGNOSIS — R5383 Other fatigue: Secondary | ICD-10-CM

## 2020-01-05 NOTE — Therapy (Signed)
[place] MAIN [facility] SERVICES [address] [place], [place], [address] Phone: [phone]   Fax:  [phone]  Physical Therapy Treatment  Patient Details  Name: Jose Castro MRN: [id number] Date of Birth: [birth year] Referring Provider (PT): Dr. Letta Pate   Encounter Date: 01/05/2020  PT End of Session - 01/05/20 1258    Visit Number  26    Number of Visits  49    Date for PT Re-Evaluation  03/06/20    PT Start Time  1100    PT Stop Time  1140    PT Time Calculation (min)  40 min    Equipment Utilized During Treatment  Gait belt    Activity Tolerance  Patient tolerated treatment well;Patient limited by pain   L knee pain   Behavior During Therapy  [facility] for tasks assessed/performed       Past Medical History:  Diagnosis Date  . Arthritis   . BPH (benign prostatic hyperplasia)   . Chronic kidney disease    had a kidney stone which per family was a cyst that was removed  . Dysrhythmia   . GERD (gastroesophageal reflux disease)   . History of kidney stones   . Hyperlipidemia   . Hypertension   . Splenic vein thrombosis   . Stroke ([place]) 05/22/2019  . Thrombosis 12/2015   mural  area and no notation of heart attack    Past Surgical History:  Procedure Laterality Date  . APPENDECTOMY    . CATARACT EXTRACTION, BILATERAL    . CYSTOSCOPY WITH INSERTION OF UROLIFT    . KIDNEY STONE SURGERY    . KNEE SURGERY    . PACEMAKER INSERTION N/A 07/13/2018   Procedure: INSERTION PACEMAKER-DUEL CHAMBER INITIAL IMPLANT;  Surgeon: Isaias Cowman, MD;  Location: [facility] ORS;  Service: Cardiovascular;  Laterality: N/A;  . SPINE SURGERY      There were no vitals filed for this visit.  Subjective Assessment - 01/05/20 1108    Subjective  Patient has no new concerns, no pain reports today.    Currently in Pain?  No/denies    Pain Score  0-No pain                   Treatment: Octane fitness x 5 mins, L 4   Neuromuscular  Re-education Dynamic balance during ambulation with single point cane approximately 300'today. Pt performed horizontal and vertical head turns with cues from therapist as well as gait speed changes. Pt also performs quick stops as well as multiple bouts of quick 180 degree turns in each direction. He demonstrates one severe lateral stagger requiring modA+1 from therapist to corrrect and prevent fall but otherwise he is stable. 6" alternating step taps without UE support x 10 each; One LE on foam, opposite LE on 6 inch stool and trunk rotation x 20  Airex 6" alternating step taps without UE support x 10 each   Therapeutic Exercise 6" step-ups from floor to stool  x 10; Standing heel raises x 15 Leg press 25 lbs x 10 , patient reports that his feet keep coming off   Pt educated throughout session about proper posture and technique with exercises. Improved exercise technique, movement at target joints, use of target muscles after min to mod verbal, visual, tactile cues.          PT Education - 01/05/20 1059    Education Details  HEP    Person(s) Educated  Patient    Methods  Explanation;Demonstration;Tactile cues;Verbal cues    Comprehension  Verbalized understanding;Returned demonstration;Tactile cues required;Need further instruction       PT Short Term Goals - 12/13/19 1613      PT SHORT TERM GOAL #1   Title  Pt will be independent with HEP in order to improve strength and balance in order to decrease fall risk and improve function at home and work.    Time  6    Period  Weeks    Status  On-going    Target Date  01/24/20        PT Long Term Goals - 12/13/19 1613      PT LONG TERM GOAL #1   Title  Pt will improve BERG to >50 in order to decrease his fall risk.    Baseline  09/21/19: 42/56, 11/03/19: 47/56; 12/13/19: 46/56    Time  12    Period  Weeks    Status  Partially Met    Target Date  03/06/20      PT LONG TERM GOAL #2   Title  Pt will decrease TUG to  below 14 seconds/decrease in order to demonstrate decreased fall risk.    Baseline  09/21/19: 22.19s; 11/03/19: 19.0s; 12/13/19: 17.0s    Time  12    Period  Weeks    Status  Partially Met    Target Date  03/06/20      PT LONG TERM GOAL #3   Title  Pt will decrease 5TSTS by at least 3 seconds in order to demonstrate clinically significant improvement in LE strength.    Baseline  09/21/19: 32.28s; 11/03/19: 15.5s; 12/13/19: 14.2s    Time  12    Period  Weeks    Status  Achieved      PT LONG TERM GOAL #4   Title  Pt will increase his 10MWT to >1.0 m/s in order to demonstrate a clinically significant improvement in gait speed and improved community ambulation    Baseline  09/21/19: Self-selected: 20.9 s = 0.48 m/s; Fastest: 17.1s =0.58 m/s; 11/03/19: Self-selected: 17.7s s = 0.56 m/s; Fastest: 13.5s =0.74 m/s with walker; 12/13/19: Self-selected:  12.6s =  0.79 m/s; Fastest: 12.2 = 0.82 m/s with walker    Time  12    Period  Weeks    Status  Revised    Target Date  03/06/20            Plan - 01/05/20 1100    Clinical Impression Statement  Patient requires verbal cueing and CGA during all dynamic balance activities. Patient required occasional rest breaks but overall endurance improving. Patient's performance during dynamic standing tasks,  improved with minimal loss of balance after patient educated on forward weight shift. Patient will continue to benefit from skilled therapy in order to decrease risk for falls and improve dynamic standing balance.   Personal Factors and Comorbidities  Age;Comorbidity 2    Comorbidities  HTN, CVA    Examination-Activity Limitations  Bend;Lift;Squat;Locomotion Level;Stairs;Stand    Examination-Participation Restrictions  Community Activity;Driving    Stability/Clinical Decision Making  Evolving/Moderate complexity    Rehab Potential  Good    PT Frequency  2x / week    PT Duration  12 weeks    PT Treatment/Interventions  ADLs/Self Care Home  Management;Canalith Repostioning;Cryotherapy;Electrical Stimulation;Iontophoresis '4mg'$ /ml Dexamethasone;Moist Heat;Traction;Ultrasound;DME Instruction;Gait training;Stair training;Therapeutic activities;Therapeutic exercise;Functional mobility training;Balance training;Neuromuscular re-education;Patient/family education;Manual techniques;Dry needling;Vestibular;Joint Manipulations;Spinal Manipulations    PT Next Visit Plan  progress strength and balance as well as gait with  spc    PT Home Exercise Plan  Access Code: [email or web address]    Consulted and Agree with Plan of Care  Patient;Family member/caregiver       Patient will benefit from skilled therapeutic intervention in order to improve the following deficits and impairments:  Abnormal gait, Decreased balance, Decreased endurance, Decreased mobility, Difficulty walking, Decreased knowledge of precautions, Decreased safety awareness, Decreased activity tolerance, Decreased strength, Hypermobility  Visit Diagnosis: Muscle weakness (generalized)  Other lack of coordination  Unsteadiness on feet  Decreased strength of lower extremity  Other abnormalities of gait and mobility  Lethargy  Right middle cerebral artery stroke [facility])     Problem List Patient Active Problem List   Diagnosis Date Noted  . Chronic venous insufficiency 12/05/2019  . 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 Great toe pain, right   . Lethargy   . CKD (chronic kidney disease), stage II   . Leukocytosis   . Benign essential HTN   . New onset atrial fibrillation ([place])   . Dysphagia, post-stroke   . 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 Acute ischemic right MCA stroke ([place]) 05/25/2019  . Goals of care, counseling/discussion   . Palliative care by specialist   . DNR (do not resuscitate) discussion   . Stroke, acute, embolic ([place]) 99/80/0123  . Persistent proteinuria 07/30/2018  . 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  Chronic kidney disease, stage III (moderate) 06/15/2018  . Leg pain 05/06/2018  . Lymphedema 05/06/2018  . Coagulopathy ([place]) 07/07/2017  . Lumbar spondylosis 03/10/2017  . Elevated uric acid in blood 11/20/2016  . BPH (benign prostatic hyperplasia) 05/29/2016  . Peripheral vascular disease of lower extremity ([place]) 03/03/2016  . Gallstone 12/31/2015  . 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 Atherosclerosis of aorta ([place]) 11/22/2015  . Carotid artery narrowing 11/22/2015  . Diverticulosis of colon 11/22/2015  . Decreased creatinine clearance 11/22/2015  . Arthritis, degenerative 11/22/2015  . Lactose intolerance 11/22/2015  . Basal cell carcinoma 11/22/2015  . Essential hypertension 06/21/2015  . Hyperlipemia 06/21/2015  . GERD (gastroesophageal reflux disease) 06/21/2015  . Calculus of kidney 11/18/2013    Alanson Puls ,PT DPT 01/05/2020, 1:00 PM  [place] MAIN [facility] SERVICES [address] [place], [place], [address] Phone: [phone]   Fax:  [phone]  Name: Jose Castro MRN: [id number] Date of Birth: Feb 01, 1926

## 2020-01-10 ENCOUNTER — Ambulatory Visit: Payer: Medicare Other | Admitting: Occupational Therapy

## 2020-01-10 ENCOUNTER — Other Ambulatory Visit: Payer: Self-pay

## 2020-01-10 ENCOUNTER — Encounter: Payer: Self-pay | Admitting: Occupational Therapy

## 2020-01-10 ENCOUNTER — Ambulatory Visit: Payer: Medicare Other

## 2020-01-10 DIAGNOSIS — R2681 Unsteadiness on feet: Secondary | ICD-10-CM

## 2020-01-10 DIAGNOSIS — R278 Other lack of coordination: Secondary | ICD-10-CM

## 2020-01-10 DIAGNOSIS — M6281 Muscle weakness (generalized): Secondary | ICD-10-CM

## 2020-01-10 NOTE — Therapy (Signed)
[place]  [facility] [address] [place], [place], [address] Phone: [phone]   Fax:  [phone]  Physical Therapy Treatment  Patient Details  Name: Jose Castro MRN: [id number] Date of Birth: 02/25/1926 Referring Provider (PT): Dr. Kirsteins   Encounter Date: 01/10/2020  PT End of Session - 01/10/20 1517    Visit Number  27    Number of Visits  49    Date for PT Re-Evaluation  03/06/20    PT Start Time  1518    PT Stop Time  1600    PT Time Calculation (min)  42 min    Equipment Utilized During Treatment  Gait belt    Activity Tolerance  Patient tolerated treatment well;Patient limited by pain   L knee pain   Behavior During Therapy  WFL for tasks assessed/performed       Past Medical History:  Diagnosis Date  . Arthritis   . BPH (benign prostatic hyperplasia)   . Chronic kidney disease    had a kidney stone which per family was a cyst that was removed  . Dysrhythmia   . GERD (gastroesophageal reflux disease)   . History of kidney stones   . Hyperlipidemia   . Hypertension   . Splenic vein thrombosis   . Stroke ([facility]) 05/22/2019  . Thrombosis 12/2015   mural  area and no notation of heart attack    Past Surgical History:  Procedure Laterality Date  . APPENDECTOMY    . CATARACT EXTRACTION, BILATERAL    . CYSTOSCOPY WITH INSERTION OF UROLIFT    . KIDNEY STONE SURGERY    . KNEE SURGERY    . PACEMAKER INSERTION N/A 07/13/2018   Procedure: INSERTION PACEMAKER-DUEL CHAMBER INITIAL IMPLANT;  Surgeon: Paraschos, Alexander, MD;  Location: [facility] ORS;  Service: Cardiovascular;  Laterality: N/A;  . SPINE SURGERY      There were no vitals filed for this visit.  Subjective Assessment - 01/10/20 1516    Subjective  Pt reports doing well. He denies any aches or pains upon arrival with the exception of his chronic L knee pain but states that it is feeling pretty good today. Reports 1 stumble since last visit but denies falls.  No additional updates at this time. No specific questions or concerns currently.    Pertinent History  Pt is a 86-year-old male who presents with imbalance and difficulty with gait following a right frontal CVA with left hemiparesis on 05/22/19.  He has completed inpatient and home health PT, and now presents for OP PT.  He was discharged from inpatient rehab at a supervision level of assistance, ambulating with a RW.  His family notes that he is impulsive and will try to ambulate without his RW at home.  Family is currently providing 24/7 supervision.    How long can you sit comfortably?  no limitations    How long can you walk comfortably?  he requires a RW at home    Patient Stated Goals  get back to normal; want to be able to play golf and go out to play trivia again.  His daughters want him to regain independence with going to the bathroom at night    Currently in Pain?  No/denies         TREATMENT   Neuromuscular Re-education Dynamic balance during ambulation with single point cane approximately 350'today. Pt performed horizontal and vertical head turns with cues from therapist as well as gait speed change on cue.   Pt challenged with carrying on a conversation with his granddaughter during ambulation today; 6" alternating step taps without UE support x 10 each; Four square stepping x 3 clockwise and x 3 counterclockwise; NBOS eyes closed x 30s; Semitandem balance alternating forward LE x 30s each;   Therapeutic Exercise NuStep L2-4 for warm-up during history with therapist adjusting resistance to match pt intensity x 5 minutes (3 minutes unbilled); 6" step-ups leading with RLE up and LLE down to minimize L knee pain x 5, pt reports increase in L knee pain during last 2 reps so discontinued; Seated HS stretch 30s x 2 bilateral; Seated hip flexion marchingwith3# ankle weightx 15 bilateral; Seated LAQ with 3# ankle weight x 15 bilateral; Seated HS curls with green tband x 15  bilateral; Seated clams with green tband resistance x 15; Seated adductor squeeze with manual resistance x 15; Standing heel raises with single RUE support x 15;   Pt educated throughout session about proper posture and technique with exercises. Improved exercise technique, movement at target joints, use of target muscles after min to mod verbal, visual, tactile cues.   Ptdemonstratesexcellent motivation during session today.He is able to again perform dynamic balance activities during gait. Continued with seated and standing strengthening today as pt reports increase in L knee pain as session progresses. Repeated four square stepping as well as semitandem and eyes closed balance activities.Pt encouraged to continue HEP and follow-up as scheduled.Pt will benefit from skilled PT services to address deficits in balance and decrease risk for future falls.                         PT Short Term Goals - 12/13/19 1613      PT SHORT TERM GOAL #1   Title  Pt will be independent with HEP in order to improve strength and balance in order to decrease fall risk and improve function at home and work.    Time  6    Period  Weeks    Status  On-going    Target Date  01/24/20        PT Long Term Goals - 12/13/19 1613      PT LONG TERM GOAL #1   Title  Pt will improve BERG to >50 in order to decrease his fall risk.    Baseline  09/21/19: 42/56, 11/03/19: 47/56; 12/13/19: 46/56    Time  12    Period  Weeks    Status  Partially Met    Target Date  03/06/20      PT LONG TERM GOAL #2   Title  Pt will decrease TUG to below 14 seconds/decrease in order to demonstrate decreased fall risk.    Baseline  09/21/19: 22.19s; 11/03/19: 19.0s; 12/13/19: 17.0s    Time  12    Period  Weeks    Status  Partially Met    Target Date  03/06/20      PT LONG TERM GOAL #3   Title  Pt will decrease 5TSTS by at least 3 seconds in order to demonstrate clinically significant improvement in LE  strength.    Baseline  09/21/19: 32.28s; 11/03/19: 15.5s; 12/13/19: 14.2s    Time  12    Period  Weeks    Status  Achieved      PT LONG TERM GOAL #4   Title  Pt will increase his 10MWT to >1.0 m/s in order to demonstrate a clinically significant improvement in gait speed and   improved community ambulation    Baseline  09/21/19: Self-selected: 20.9 s = 0.48 m/s; Fastest: 17.1s =0.58 m/s; 11/03/19: Self-selected: 17.7s s = 0.56 m/s; Fastest: 13.5s =0.74 m/s with walker; 12/13/19: Self-selected:  12.6s =  0.79 m/s; Fastest: 12.2 = 0.82 m/s with walker    Time  12    Period  Weeks    Status  Revised    Target Date  03/06/20            Plan - 01/10/20 1521    Clinical Impression Statement  Pt demonstrates excellent motivation during session today. He is able to again perform dynamic balance activities during gait. Continued with seated and standing strengthening today as pt reports increase in L knee pain as session progresses. Repeated four square stepping as well as semitandem and eyes closed balance activities. Pt encouraged to continue HEP and follow-up as scheduled. Pt will benefit from skilled PT services to address deficits in balance and decrease risk for future falls.    Personal Factors and Comorbidities  Age;Comorbidity 2    Comorbidities  HTN, CVA    Examination-Activity Limitations  Bend;Lift;Squat;Locomotion Level;Stairs;Stand    Examination-Participation Restrictions  Community Activity;Driving    Stability/Clinical Decision Making  Evolving/Moderate complexity    Rehab Potential  Good    PT Frequency  2x / week    PT Duration  12 weeks    PT Treatment/Interventions  ADLs/Self Care Home Management;Canalith Repostioning;Cryotherapy;Electrical Stimulation;Iontophoresis 4mg/ml Dexamethasone;Moist Heat;Traction;Ultrasound;DME Instruction;Gait training;Stair training;Therapeutic activities;Therapeutic exercise;Functional mobility training;Balance training;Neuromuscular  re-education;Patient/family education;Manual techniques;Dry needling;Vestibular;Joint Manipulations;Spinal Manipulations    PT Next Visit Plan  progress strength and balance as well as gait with spc    PT Home Exercise Plan  Access Code: [email or web address]    Consulted and Agree with Plan of Care  Patient;Family member/caregiver       Patient will benefit from skilled therapeutic intervention in order to improve the following deficits and impairments:  Abnormal gait, Decreased balance, Decreased endurance, Decreased mobility, Difficulty walking, Decreased knowledge of precautions, Decreased safety awareness, Decreased activity tolerance, Decreased strength, Hypermobility  Visit Diagnosis: Muscle weakness (generalized)  Unsteadiness on feet  Other lack of coordination     Problem List Patient Active Problem List   Diagnosis Date Noted  . Chronic venous insufficiency 12/05/2019  . 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 Great toe pain, right   . Lethargy   . CKD (chronic kidney disease), stage II   . Leukocytosis   . Benign essential HTN   . New onset atrial fibrillation ([facility])   . Dysphagia, post-stroke   . 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  . Acute ischemic right MCA stroke ([facility]) 05/25/2019  . Goals of care, counseling/discussion   . Palliative care by specialist   . DNR (do not resuscitate) discussion   . Stroke, acute, embolic ([facility]) 05/22/2019  . Persistent proteinuria 07/30/2018  . 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 Chronic kidney disease, stage III (moderate) 06/15/2018  . Leg pain 05/06/2018  . Lymphedema 05/06/2018  . Coagulopathy ([facility]) 07/07/2017  . Lumbar spondylosis 03/10/2017  . Elevated uric acid in blood 11/20/2016  . BPH (benign prostatic hyperplasia) 05/29/2016  . Peripheral vascular disease of lower extremity ([facility]) 03/03/2016  . Gallstone 12/31/2015  . 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 Atherosclerosis of aorta ([facility]) 11/22/2015  . Carotid artery narrowing 11/22/2015  . Diverticulosis of colon 11/22/2015  . Decreased creatinine clearance 11/22/2015  .   Arthritis, degenerative 11/22/2015  . Lactose intolerance 11/22/2015  . Basal cell carcinoma 11/22/2015  . Essential hypertension 06/21/2015  . Hyperlipemia 06/21/2015  . GERD (gastroesophageal reflux disease) 06/21/2015  . Calculus of kidney 11/18/2013   Jason D Huprich PT, DPT, GCS  Huprich,Jason 01/10/2020, 4:18 PM   [place] [facility] [address] [place], [place], [address] Phone: [phone]   Fax:  [phone]  Name: Bensen R Zepeda MRN: [id number] Date of Birth: 12/11/1925   

## 2020-01-10 NOTE — Therapy (Signed)
[place] MAIN [facility] SERVICES [address] [place], [place], [address] Phone: [phone]   Fax:  [phone]  Occupational Therapy Treatment  Patient Details  Name: Jose Castro MRN: [id number] Date of Birth: June 25, 1926 Referring Provider (OT): Steele Sizer   Encounter Date: 01/10/2020  OT End of Session - 01/10/20 1450    Visit Number  22    Number of Visits  26    Date for OT Re-Evaluation  01/27/20    Authorization Type  progress report from 12/13/2019    OT Start Time  1432    OT Stop Time  1515    OT Time Calculation (min)  43 min    Activity Tolerance  Patient tolerated treatment well    Behavior During Therapy  [facility] for tasks assessed/performed       Past Medical History:  Diagnosis Date  . Arthritis   . BPH (benign prostatic hyperplasia)   . Chronic kidney disease    had a kidney stone which per family was a cyst that was removed  . Dysrhythmia   . GERD (gastroesophageal reflux disease)   . History of kidney stones   . Hyperlipidemia   . Hypertension   . Splenic vein thrombosis   . Stroke ([place]) 05/22/2019  . Thrombosis 12/2015   mural  area and no notation of heart attack    Past Surgical History:  Procedure Laterality Date  . APPENDECTOMY    . CATARACT EXTRACTION, BILATERAL    . CYSTOSCOPY WITH INSERTION OF UROLIFT    . KIDNEY STONE SURGERY    . KNEE SURGERY    . PACEMAKER INSERTION N/A 07/13/2018   Procedure: INSERTION PACEMAKER-DUEL CHAMBER INITIAL IMPLANT;  Surgeon: Isaias Cowman, MD;  Location: [facility] ORS;  Service: Cardiovascular;  Laterality: N/A;  . SPINE SURGERY      There were no vitals filed for this visit.  Subjective Assessment - 01/10/20 1449    Subjective   Pt. reports doing well today.    Pertinent History  Pt. is a 86 y.o. male who was admitted to [facility] with a [facility] Infarct on 05/21/2019. Pt. went through Inpatient Rehab, and had home health OT, PT, and ST services.  Pt. was previously residing home alone, and was independent with ADLs, and IADLs. Pt. now has family staying with the pt. around the clock.    Patient Stated Goals  to regain functional independence safely    Currently in Pain?  No/denies      OT TREATMENT    Neuro muscular re-education:  Pt. worked on grasping, and manipulating 1", 3/4", and 1/2" washers from a magnetic dish using point grasp pattern. Pt. worked on reaching up, stabilizing, and sustaining shoulder elevation while placing the washer over a small precise target on vertical dowels positioned at various angles.   Therapeutic Exercise:  Pt. worked on the [employer] for 8 min. with constant monitoring of the BUEs. Pt. worked on changing, and alternating forward reverse position for 2 min at the end. Pt. worked on level 4.5. Pt. performed gross gripping with a grip strengthener. Pt. worked on sustaining grip while grasping pegs and reaching at various heights. The gripper was set at 23.4# of resistive force. Pt. performed resistive EZ Board exercises for forearm supination/pronation, wrist flexion/extension using gross grasp, and lateral pinch (key) grasp. Pt. performed resistive EZ Board exercises angled in several planes to promote shoulder flexion, abduction, and wrist flexion, and extension while performing resistive wrist flexion  and extension with a gross grip. Pt. Required verbal cues, and visual demonstration for proper there. Ex. Technique.  Response to Treatment:  Pt. continues to make progress overall. and is using is left hand more during ADL tasks at home. pt. is using his left hand to assist with tying his shoes, and cutting meat. Pt. Verbal cues, and cues for visual demonstration. Pt. continues to work on improving LUE strength, and [facility] skills in order to work towards improving left functional hand use during ADLs, and IADL tasks                      OT Education - 01/10/20 1450    Education Details  LUE  strength    Person(s) Educated  Patient    Methods  Explanation;Demonstration    Comprehension  Verbalized understanding;Returned demonstration;Verbal cues required          OT Long Term Goals - 12/16/19 1444      OT LONG TERM GOAL #1   Title  Pt. will demonstrate home management tasks with supervision initating safe strategies    Baseline  Eval: Decreased safety awareness, 12/13/19 min assist with home management tasks    Time  12    Period  Weeks    Status  On-going    Target Date  01/27/20      OT LONG TERM GOAL #2   Title  Pt. will be able to identify 100% of potential safety hazards in a simulated home environment during ADLs, and IADLs.    Baseline  Eval: Limited safety awareness    Time  12    Period  Weeks    Status  Achieved      OT LONG TERM GOAL #3   Title  Pt. will increase left grip strength by 5# of force to assist with holding items duirng ADLs, and IADLs.    Baseline  Eval: decreased left grip strength, 12/13/19 increased by 4#    Time  12    Period  Weeks    Status  On-going    Target Date  01/27/20      OT LONG TERM GOAL #4   Title  Pt. will improve left hand [facility] skills by 3 sec of speed to be able to manipulate objects during ADLs, and IADL tasks.    Baseline  improved by 2 secs 12/13/19    Time  12    Period  Weeks    Status  On-going    Target Date  01/27/20      OT LONG TERM GOAL #5   Title  Pt. will demonstrate visual compensatory strategies as needed during ADLs, and IADL tasks.    Baseline  Eval: Limited left sided awareness, 12/13/19 improved but continues to require some min cues with IADL tasks    Time  12    Period  Weeks    Status  On-going    Target Date  01/27/20      OT LONG TERM GOAL #6   Title  Pt. will demonstrate cognitive compensatory strategies during ADLs, and IADLs    Time  12    Period  Weeks    Status  Achieved            Plan - 01/10/20 1451    Clinical Impression Statement Pt. continues to make progress overall.  and is using is left hand more during ADL tasks at home. pt. is using his left hand to assist with  tying his shoes, and cutting meat. Pt. Verbal cues, and cues for visual demonstration. Pt. continues to work on improving LUE strength, and [facility] skills in order to work towards improving left functional hand use during ADLs, and IADL tasks.   OT Occupational Profile and History  Detailed Assessment- Review of Records and additional review of physical, cognitive, psychosocial history related to current functional performance    Occupational performance deficits (Please refer to evaluation for details):  ADL's;IADL's    Body Structure / Function / Physical Skills  ADL;FMC;ROM;IADL;Strength;Decreased knowledge of use of DME;UE functional use    Rehab Potential  Excellent    Clinical Decision Making  Several treatment options, min-mod task modification necessary    Comorbidities Affecting Occupational Performance:  May have comorbidities impacting occupational performance    Modification or Assistance to Complete Evaluation   Min-Moderate modification of tasks or assist with assess necessary to complete eval    OT Frequency  1x / week    OT Duration  6 weeks    OT Treatment/Interventions  Self-care/ADL training;DME and/or AE instruction;Therapeutic exercise;Neuromuscular education;Patient/family education;Therapeutic activities    Consulted and Agree with Plan of Care  Patient       Patient will benefit from skilled therapeutic intervention in order to improve the following deficits and impairments:   Body Structure / Function / Physical Skills: ADL, FMC, ROM, IADL, Strength, Decreased knowledge of use of DME, UE functional use       Visit Diagnosis: Muscle weakness (generalized)  Other lack of coordination    Problem List Patient Active Problem List   Diagnosis Date Noted  . Chronic venous insufficiency 12/05/2019  . 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 Great toe pain, right    . Lethargy   . CKD (chronic kidney disease), stage II   . Leukocytosis   . Benign essential HTN   . New onset atrial fibrillation ([place])   . Dysphagia, post-stroke   . 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 Acute ischemic right MCA stroke ([place]) 05/25/2019  . Goals of care, counseling/discussion   . Palliative care by specialist   . DNR (do not resuscitate) discussion   . Stroke, acute, embolic ([place]) 63/14/9702  . Persistent proteinuria 07/30/2018  . 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 Chronic kidney disease, stage III (moderate) 06/15/2018  . Leg pain 05/06/2018  . Lymphedema 05/06/2018  . Coagulopathy ([place]) 07/07/2017  . Lumbar spondylosis 03/10/2017  . Elevated uric acid in blood 11/20/2016  . BPH (benign prostatic hyperplasia) 05/29/2016  . Peripheral vascular disease of lower extremity ([place]) 03/03/2016  . Gallstone 12/31/2015  . 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 Atherosclerosis of aorta ([place]) 11/22/2015  . Carotid artery narrowing 11/22/2015  . Diverticulosis of colon 11/22/2015  . Decreased creatinine clearance 11/22/2015  . Arthritis, degenerative 11/22/2015  . Lactose intolerance 11/22/2015  . Basal cell carcinoma 11/22/2015  . Essential hypertension 06/21/2015  . Hyperlipemia 06/21/2015  . GERD (gastroesophageal reflux disease) 06/21/2015  . Calculus of kidney 11/18/2013    Harrel Carina, MS, OTR/L 01/10/2020, 4:30 PM  [place] [address] [place], [place], [address] Phone: [phone]   Fax:  [phone]  Name: Jose Castro MRN: [id number] Date of Birth: [birth year]

## 2020-01-12 ENCOUNTER — Ambulatory Visit: Payer: Medicare Other

## 2020-01-12 ENCOUNTER — Encounter: Payer: Medicare Other | Admitting: Occupational Therapy

## 2020-01-12 ENCOUNTER — Other Ambulatory Visit: Payer: Self-pay

## 2020-01-12 DIAGNOSIS — M6281 Muscle weakness (generalized): Secondary | ICD-10-CM | POA: Diagnosis not present

## 2020-01-12 DIAGNOSIS — R2681 Unsteadiness on feet: Secondary | ICD-10-CM

## 2020-01-12 NOTE — Therapy (Signed)
[place] MAIN [facility] SERVICES [address] [place], [place], [address] Phone: [phone]   Fax:  [phone]  Physical Therapy Treatment  Patient Details  Name: Jose Castro MRN: [id number] Date of Birth: Aug 08, 1926 Referring Provider (PT): Dr. Letta Pate   Encounter Date: 01/12/2020  PT End of Session - 01/12/20 1059    Visit Number  28    Number of Visits  49    Date for PT Re-Evaluation  03/06/20    PT Start Time  1100    PT Stop Time  1145    PT Time Calculation (min)  45 min    Equipment Utilized During Treatment  Gait belt    Activity Tolerance  Patient tolerated treatment well;Patient limited by pain   L knee pain   Behavior During Therapy  [facility] for tasks assessed/performed       Past Medical History:  Diagnosis Date  . Arthritis   . BPH (benign prostatic hyperplasia)   . Chronic kidney disease    had a kidney stone which per family was a cyst that was removed  . Dysrhythmia   . GERD (gastroesophageal reflux disease)   . History of kidney stones   . Hyperlipidemia   . Hypertension   . Splenic vein thrombosis   . Stroke ([place]) 05/22/2019  . Thrombosis 12/2015   mural  area and no notation of heart attack    Past Surgical History:  Procedure Laterality Date  . APPENDECTOMY    . CATARACT EXTRACTION, BILATERAL    . CYSTOSCOPY WITH INSERTION OF UROLIFT    . KIDNEY STONE SURGERY    . KNEE SURGERY    . PACEMAKER INSERTION N/A 07/13/2018   Procedure: INSERTION PACEMAKER-DUEL CHAMBER INITIAL IMPLANT;  Surgeon: Isaias Cowman, MD;  Location: [facility] ORS;  Service: Cardiovascular;  Laterality: N/A;  . SPINE SURGERY      There were no vitals filed for this visit.  Subjective Assessment - 01/12/20 1058    Subjective  Pt reports doing well. He denies any aches or pains upon arrival with the exception of his chronic L knee pain but states that it is feeling pretty good today. No additional updates at this time. No specific  questions or concerns currently.    Pertinent History  Pt is a 86 year old male who presents with imbalance and difficulty with gait following a right frontal CVA with left hemiparesis on 05/22/19.  He has completed inpatient and home health PT, and now presents for OP PT.  He was discharged from inpatient rehab at a supervision level of assistance, ambulating with a RW.  His family notes that he is impulsive and will try to ambulate without his RW at home.  Family is currently providing 24/7 supervision.    How long can you sit comfortably?  no limitations    How long can you walk comfortably?  he requires a RW at home    Patient Stated Goals  get back to normal; want to be able to play golf and go out to play trivia again.  His daughters want him to regain independence with going to the bathroom at night    Currently in Pain?  No/denies         TREATMENT   Neuromuscular Re-education Dynamic balance during ambulation with single point cane approximately 600'today. Pt performed horizontal and vertical head turns with cues from therapist as well as gait speed changes. Pt also performs quick stops as well as multiple bouts of  quick 180 degree turns in each direction. He demonstrates slowing during conversation and with head turns but no LOB today.  6" alternating step taps without UE support x 10 each; NBOS eyes closed x 30s; Semitandem balance alternating forward LE x 30s each;   Therapeutic Exercise NuStep L2-4 for warm-up during history with therapist adjusting resistance to match pt intensity x 5 minutes (3 minutes unbilled); Seated HS stretch 45s x 2 bilateral; Seated hip flexion marchingwith4# ankle weightx 20 bilateral; Seated LAQ with 4# ankle weight x 20 bilateral; Seated HS curls with green tband x 20 bilateral; Seated clams with green tband resistance x 20; Seated adductor squeeze with ball between knees 3s hold x 20; Standingheel raises with single RUE support x  20;   Pt educated throughout session about proper posture and technique with exercises. Improved exercise technique, movement at target joints, use of target muscles after min to mod verbal, visual, tactile cues.   Ptdemonstratesexcellent motivation during session today.Heis able to again perform dynamic balance activities during gait. Continued with seated and standing strengthening today. Increased ankle weights today as well as increased repetitions. Improved stability today noted with gait using single point cane however he does slow down during head turns and with conversation.Pt encouraged to continue HEP and follow-up as scheduled.Pt will benefit from skilled PT services to address deficits in balance and decrease risk for future falls.                        PT Short Term Goals - 12/13/19 1613      PT SHORT TERM GOAL #1   Title  Pt will be independent with HEP in order to improve strength and balance in order to decrease fall risk and improve function at home and work.    Time  6    Period  Weeks    Status  On-going    Target Date  01/24/20        PT Long Term Goals - 12/13/19 1613      PT LONG TERM GOAL #1   Title  Pt will improve BERG to >50 in order to decrease his fall risk.    Baseline  09/21/19: 42/56, 11/03/19: 47/56; 12/13/19: 46/56    Time  12    Period  Weeks    Status  Partially Met    Target Date  03/06/20      PT LONG TERM GOAL #2   Title  Pt will decrease TUG to below 14 seconds/decrease in order to demonstrate decreased fall risk.    Baseline  09/21/19: 22.19s; 11/03/19: 19.0s; 12/13/19: 17.0s    Time  12    Period  Weeks    Status  Partially Met    Target Date  03/06/20      PT LONG TERM GOAL #3   Title  Pt will decrease 5TSTS by at least 3 seconds in order to demonstrate clinically significant improvement in LE strength.    Baseline  09/21/19: 32.28s; 11/03/19: 15.5s; 12/13/19: 14.2s    Time  12    Period  Weeks    Status   Achieved      PT LONG TERM GOAL #4   Title  Pt will increase his 10MWT to >1.0 m/s in order to demonstrate a clinically significant improvement in gait speed and improved community ambulation    Baseline  09/21/19: Self-selected: 20.9 s = 0.48 m/s; Fastest: 17.1s =0.58 m/s; 11/03/19: Self-selected: 17.7s s = 0.56  m/s; Fastest: 13.5s =0.74 m/s with walker; 12/13/19: Self-selected:  12.6s =  0.79 m/s; Fastest: 12.2 = 0.82 m/s with walker    Time  12    Period  Weeks    Status  Revised    Target Date  03/06/20            Plan - 01/12/20 1059    Clinical Impression Statement  Pt demonstrates excellent motivation during session today. He is able to again perform dynamic balance activities during gait. Continued with seated and standing strengthening today. Increased ankle weights today as well as increased repetitions. Improved stability today noted with gait using single point cane however he does slow down during head turns and with conversation. Pt encouraged to continue HEP and follow-up as scheduled. Pt will benefit from skilled PT services to address deficits in balance and decrease risk for future falls.    Personal Factors and Comorbidities  Age;Comorbidity 2    Comorbidities  HTN, CVA    Examination-Activity Limitations  Bend;Lift;Squat;Locomotion Level;Stairs;Stand    Examination-Participation Restrictions  Community Activity;Driving    Stability/Clinical Decision Making  Evolving/Moderate complexity    Rehab Potential  Good    PT Frequency  2x / week    PT Duration  12 weeks    PT Treatment/Interventions  ADLs/Self Care Home Management;Canalith Repostioning;Cryotherapy;Electrical Stimulation;Iontophoresis 66m/ml Dexamethasone;Moist Heat;Traction;Ultrasound;DME Instruction;Gait training;Stair training;Therapeutic activities;Therapeutic exercise;Functional mobility training;Balance training;Neuromuscular re-education;Patient/family education;Manual techniques;Dry  needling;Vestibular;Joint Manipulations;Spinal Manipulations    PT Next Visit Plan  progress strength and balance as well as gait with spc    PT Home Exercise Plan  Access Code: [email or web address]   Consulted and Agree with Plan of Care  Patient;Family member/caregiver       Patient will benefit from skilled therapeutic intervention in order to improve the following deficits and impairments:  Abnormal gait, Decreased balance, Decreased endurance, Decreased mobility, Difficulty walking, Decreased knowledge of precautions, Decreased safety awareness, Decreased activity tolerance, Decreased strength, Hypermobility  Visit Diagnosis: Muscle weakness (generalized)  Unsteadiness on feet     Problem List Patient Active Problem List   Diagnosis Date Noted  . Chronic venous insufficiency 12/05/2019  . 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 Great toe pain, right   . Lethargy   . CKD (chronic kidney disease), stage II   . Leukocytosis   . Benign essential HTN   . New onset atrial fibrillation ([place]   . Dysphagia, post-stroke   . Right middle cerebral artery stroke ([place] 05/27/2019  . Acute ischemic right MCA stroke ([place] 05/25/2019  . Goals of care, counseling/discussion   . Palliative care by specialist   . DNR (do not resuscitate) discussion   . Stroke, acute, embolic ([place] 059/56/3875 . Persistent proteinuria 07/30/2018  . 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 Chronic kidney disease, stage III (moderate) 06/15/2018  . Leg pain 05/06/2018  . Lymphedema 05/06/2018  . Coagulopathy ([place] 07/07/2017  . Lumbar spondylosis 03/10/2017  . Elevated uric acid in blood 11/20/2016  . BPH (benign prostatic hyperplasia) 05/29/2016  . Peripheral vascular disease of lower extremity ([place] 03/03/2016  . Gallstone 12/31/2015  . 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 Atherosclerosis of aorta ([place] 11/22/2015  . Carotid artery narrowing 11/22/2015  .  Diverticulosis of colon 11/22/2015  . Decreased creatinine clearance 11/22/2015  . Arthritis, degenerative 11/22/2015  . Lactose intolerance 11/22/2015  . Basal cell carcinoma 11/22/2015  . Essential hypertension 06/21/2015  . Hyperlipemia  06/21/2015  . GERD (gastroesophageal reflux disease) 06/21/2015  . Calculus of kidney 11/18/2013   Phillips Grout PT, DPT, GCS  Jeronda Don 01/13/2020, 11:24 AM  [place] MAIN [facility] SERVICES [address] [place], [place], [address] Phone: [phone]   Fax:  [phone]  Name: DUVALL COMES MRN: [id number] Date of Birth: October 24, 1926

## 2020-01-17 ENCOUNTER — Encounter: Payer: Self-pay | Admitting: Adult Health

## 2020-01-17 ENCOUNTER — Encounter: Payer: Self-pay | Admitting: Physical Medicine & Rehabilitation

## 2020-01-17 ENCOUNTER — Encounter: Payer: Medicare Other | Attending: Registered Nurse | Admitting: Physical Medicine & Rehabilitation

## 2020-01-17 ENCOUNTER — Other Ambulatory Visit: Payer: Self-pay

## 2020-01-17 ENCOUNTER — Ambulatory Visit (INDEPENDENT_AMBULATORY_CARE_PROVIDER_SITE_OTHER): Payer: Medicare Other | Admitting: Adult Health

## 2020-01-17 VITALS — BP 124/87 | HR 69 | Ht 70.5 in | Wt 168.0 lb

## 2020-01-17 VITALS — BP 128/64 | HR 70 | Temp 97.3°F | Ht 70.5 in | Wt 168.4 lb

## 2020-01-17 DIAGNOSIS — E785 Hyperlipidemia, unspecified: Secondary | ICD-10-CM | POA: Diagnosis not present

## 2020-01-17 DIAGNOSIS — I69359 Hemiplegia and hemiparesis following cerebral infarction affecting unspecified side: Secondary | ICD-10-CM | POA: Diagnosis not present

## 2020-01-17 DIAGNOSIS — I4892 Unspecified atrial flutter: Secondary | ICD-10-CM | POA: Diagnosis not present

## 2020-01-17 DIAGNOSIS — M19071 Primary osteoarthritis, right ankle and foot: Secondary | ICD-10-CM | POA: Diagnosis present

## 2020-01-17 DIAGNOSIS — I63131 Cerebral infarction due to embolism of right carotid artery: Secondary | ICD-10-CM | POA: Diagnosis not present

## 2020-01-17 DIAGNOSIS — I63511 Cerebral infarction due to unspecified occlusion or stenosis of right middle cerebral artery: Secondary | ICD-10-CM | POA: Diagnosis present

## 2020-01-17 DIAGNOSIS — I4891 Unspecified atrial fibrillation: Secondary | ICD-10-CM | POA: Diagnosis present

## 2020-01-17 DIAGNOSIS — I10 Essential (primary) hypertension: Secondary | ICD-10-CM

## 2020-01-17 DIAGNOSIS — R2689 Other abnormalities of gait and mobility: Secondary | ICD-10-CM

## 2020-01-17 DIAGNOSIS — I69398 Other sequelae of cerebral infarction: Secondary | ICD-10-CM

## 2020-01-17 NOTE — Progress Notes (Signed)
Subjective:    Patient ID: Jose Castro, male    DOB: 1926/09/13, 86 y.o.   MRN: [id number]  HPI  86 yo male    Subjective:    Patient ID: Jose Castro, male    DOB: Feb 19, 1926, 86 y.o.   MRN: [id number]  HPI 86 year old male with history of hypertension, CKD 2, heart block status post pacemaker placement who was found down on 05/22/2019.  Was taken to the emergency department where further work-up indicated acute nonhemorrhagic right hemispheric infarct.  MRI not completed due to permanent pacemaker.  Patient had episode of atrial fibrillation during hospitalization and was seen by cardiology.  Changed to Eliquis on 625/2020.  Inpatient rehab from 05/26/2021 06/08/2019 The patient initially required moderate assistance for mobility and was discharged at a supervision level of assistance. The patient continues have poor safety awareness and is trying to ambulate to the bathroom at night by himself.  He usually uses the walker at home but sometimes tries to ambulate without it.  According to his daughter he is quite stubborn about this. The patient has had no recent falls. His family still provides 24/7 supervision.  No new issues since last visit continues with outpatient therapy.  Starting to use a cane rather than relying on the walker.  Still not using a cane at home Pain Inventory Average Pain 4 Pain Right Now 2 My pain is intermittent  In the last 24 hours, has pain interfered with the following? General activity 2 Relation with others 0 Enjoyment of life 2 What TIME of day is your pain at its worst? . Sleep (in general) Good  Pain is worse with: walking Pain improves with: medication Relief from Meds: 5  Mobility walk with assistance use a walker ability to climb steps?  yes do you drive?  no  Function retired I need assistance with the following:  household duties and shopping  Neuro/Psych trouble walking  Prior Studies Any changes since last visit?   no  Physicians involved in your care Any changes since last visit?  no   Family History  Problem Relation Age of Onset  . Cancer Brother        bladder cancer with mets  . Heart disease Mother   . Aortic aneurysm Mother   . Heart attack Maternal Aunt   . Heart attack Maternal Uncle    Social History   Socioeconomic History  . Marital status: Widowed    Spouse name: Not on file  . Number of children: 6  . Years of education: Not on file  . Highest education level: Some college, no degree  Occupational History  . 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 Smoking status: Former Smoker    Years: 16.00    Types: Cigarettes    Start date: 10/21/1944    Quit date: 10/21/1960    Years since quitting: 59.2  . Smokeless tobacco: Never Used  Substance and Sexual Activity  . Alcohol use: Yes    Alcohol/week: 3.0 standard drinks    Types: 3 Standard drinks or equivalent per week    Comment: once a week when he goes out to dinner-socially  . Drug use: No  . Sexual activity: Yes    Partners: Female  Other Topics Concern  . Not on file  Social History Narrative   Widow, has a good networks of friends and family    Social Determinants  of Health   Financial Resource Strain:   . Difficulty of Paying Living Expenses: Not on file  Food Insecurity:   . Worried About Charity fundraiser in the Last Year: Not on file  . Ran Out of Food in the Last Year: Not on file  Transportation Needs:   . Lack of Transportation (Medical): Not on file  . Lack of Transportation (Non-Medical): Not on file  Physical Activity: Inactive  . Days of Exercise per Week: 0 days  . Minutes of Exercise per Session: 0 min  Stress:   . Feeling of Stress : Not on file  Social Connections: Unknown  . Frequency of Communication with Friends and Family: More than three times a week  . Frequency of Social Gatherings with Friends and Family: Not on file  . Attends  Religious Services: Not on file  . Active Member of Clubs or Organizations: Not on file  . Attends Archivist Meetings: Not on file  . Marital Status: Not on file   Past Surgical History:  Procedure Laterality Date  . APPENDECTOMY    . CATARACT EXTRACTION, BILATERAL    . CYSTOSCOPY WITH INSERTION OF UROLIFT    . KIDNEY STONE SURGERY    . KNEE SURGERY    . PACEMAKER INSERTION N/A 07/13/2018   Procedure: INSERTION PACEMAKER-DUEL CHAMBER INITIAL IMPLANT;  Surgeon: Isaias Cowman, MD;  Location: [facility] ORS;  Service: Cardiovascular;  Laterality: N/A;  . SPINE SURGERY     Past Medical History:  Diagnosis Date  . Arthritis   . BPH (benign prostatic hyperplasia)   . Chronic kidney disease    had a kidney stone which per family was a cyst that was removed  . Dysrhythmia   . GERD (gastroesophageal reflux disease)   . History of kidney stones   . Hyperlipidemia   . Hypertension   . Splenic vein thrombosis   . Stroke ([place]) 05/22/2019  . Thrombosis 12/2015   mural  area and no notation of heart attack   BP 124/87   Pulse 69   Ht 5' 10.5" (1.791 m)   Wt 168 lb (76.2 kg)   SpO2 98%   BMI 23.76 kg/m   Opioid Risk Score:   Fall Risk Score:  `1  Depression screen PHQ 2/9  Depression screen [facility] 2/9 06/22/2019 02/01/2019 07/30/2018 06/21/2018 06/15/2018 09/09/2017 07/07/2017  Decreased Interest 0 0 0 0 0 0 0  Down, Depressed, Hopeless 0 0 0 0 0 0 0  PHQ - 2 Score 0 0 0 0 0 0 0  Altered sleeping 1 - 0 - - - -  Tired, decreased energy 0 - 0 - - - -  Change in appetite 0 - 0 - - - -  Feeling bad or failure about yourself  0 - 0 - - - -  Trouble concentrating 0 - 0 - - - -  Moving slowly or fidgety/restless 0 - 0 - - - -  Suicidal thoughts 0 - 0 - - - -  PHQ-9 Score 1 - - - - - -  Difficult doing work/chores Not difficult at all - Not difficult at all - - - -  Some recent data might be hidden     Review of Systems  Constitutional: Negative.   HENT: Negative.   Eyes:  Negative.   Respiratory: Negative.   Cardiovascular: Negative.   Gastrointestinal: Negative.   Endocrine: Negative.   Genitourinary: Negative.   Musculoskeletal: Positive for gait problem.  Skin: Negative.  Allergic/Immunologic: Negative.   Hematological: Negative.   Psychiatric/Behavioral: Negative.   All other systems reviewed and are negative.      Objective:   Physical Exam Vitals signs and nursing note reviewed.  Constitutional:      Appearance: Normal appearance.  Eyes:     General: No visual field deficit.    Extraocular Movements: Extraocular movements intact.     Conjunctiva/sclera: Conjunctivae normal.     Pupils: Pupils are equal, round, and reactive to light.  Neurological:     Mental Status: He is alert and oriented to person, place, and time.     Cranial Nerves: No dysarthria.     Sensory: No sensory deficit.     Motor: No weakness.     Coordination: Romberg sign positive. Coordination normal. Finger-Nose-Finger Test normal. Rapid alternating movements normal.     Gait: Gait abnormal and tandem walk abnormal.     Comments: Left neglect noted on confrontation testing no evidence of homonymous hemianopsia  Motor strength is 5/5 bilateral deltoid bicep tricep grip as well as hip flexor knee extensor ankle dorsiflexor  Increased base support during ambulation.  Uses a rolling walker.  Psychiatric:        Mood and Affect: Mood normal.        Behavior: Behavior normal.           Assessment & Plan:  #1.  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  He is now on Eliquis.  His residual deficits include poor balance as well as left neglect.  Left neglect has improved He will finish out his therapy I think his goals will be to ambulate with a cane. No physical medicine rehab follow-up needed.  Patient will follow-up with neurology as well as cardiology for secondary stroke prophylaxis  2.  Chronic knee  pain with varus deformity at the knee has medial joint space collapse.  He will follow-up with orthopedics on this

## 2020-01-17 NOTE — Patient Instructions (Signed)
Please keep up with home exercises once therapy stops

## 2020-01-17 NOTE — Progress Notes (Signed)
[facility] [address] [place]. [place] [address] [phone]       OFFICE FOLLOW UP NOTE  [name] Date of Birth:  1926-06-11 Medical Record Number:  [id number]   Referring MD: Stann Mainland, PA-C  Reason for Referral: Stroke  Chief Complaint  Patient presents with   Follow-up    3 mon f/u. Daughter present. Treatment room. No new concerns at this time.       HPI:   Update 01/16/2019: Jose Castro is a 86 year old male who is being seen today for stroke follow-up previously seen on 10/17/2019.  Residual deficits gait impairment with left hemiparesis and mild left visual field neglect with improvement.  He has completed home health therapies and is currently participating in outpatient PT/OT at [facility] with ongoing improvement. Continues to ambulate with a RW but attempting to switch to cane.  He denies any recent falls.  Daughter also reports occasional visual hallucinations within his left periphery but resolves when looking towards that direction.  Family continues to provide 24-hour assistance/supervision due to daughters safety concern.  Continues on Eliquis and Crestor for secondary stroke prevention without side effects.  Blood pressure today 128/64.  No further concerns at this time.      History copied for reference purposes only Initial consult visit 07/12/2019 Dr. Leonie Man: Jose Castro is a pleasant 86 year old Caucasian male who is seen today for initial office consultation visit for stroke.  He is accompanied today by his daughter.  History is obtained from them and review of electronic medical records.  I personally reviewed imaging films in PACS.  He was admitted to [facility] on 05/21/2019 when he was found on the floor of his home.  He was last seen well on 1 PM on 05/20/2019 but the daughter.  She spoke to him at 7 PM and he was apparently fine.  Patient's granddaughter checked on him noticed he had right-sided gaze  deviation with decrease sensation and strength.  EMS was called patient was found to be in new onset atrial flutter.  Head CT scan obtained showed hypodensity in the right MCA consistent with stroke.  NIH stroke scale was 6 he was not a candidate for TPA or IR as he was felt to be outside time window.  Chest x-ray on admission showed vascular congestion possible aspiration pneumonia.  Neurological exam on admission showed dysarthria with right gaze deviation but able to cross midline.  There is 4/5 left hemiparesis.  Echocardiogram showed normal ejection fraction.  LDL cholesterol was 1 1 5  mg percent.  Hemoglobin A1c was 5.8.  Carotid ultrasound showed no significant extracranial stenosis.  Patient was seen by speech therapy and diet was advanced.  He was subsequently transferred for inpatient rehab to [facility] where he spent 10 days and has subsequently been discharged home.  He is been getting home physical and occupational therapy.  He is able to walk with a walker.  His son feels left-sided sensory loss is improved but he still has some diminished fine motor skill and mild weakness.  Patient was seen by ophthalmologist recently who felt he had no visual field loss.  He also saw urologist 3 weeks ago for possibly from prostate difficulties and is currently being treated for UTI.  Is also had significant right foot swelling and redness which was diagnosed with cellulitis.  He was seen by orthopedics.  He is finished a course of doxycycline and is currently taking Bactrim.  Patient does  have a remote history of splenic infarct for which she was on anticoagulation for some time but this was discontinued several years ago.  He has no other prior history of strokes TIAs dementia seizures or significant neurological problems.  Update 10/17/2019: Jose Castro is a 86 year old male who is being seen today for stroke follow-up.  Residual deficits of balance impairment and likely cognitive deficit and continues to  participate in outpatient therapies.  Family continues to provide 24-hour supervision due to safety concerns of increased fall risk with impulsiveness and decreased safety awareness.  He continues to ambulate with his rolling walker.  Continues to participate in outpatient therapies with ongoing improvement.  Daughter questioning duration of continued assistance and around-the-clock supervision.  When asked about patient performing ADLs and IADLs independently, she states that her siblings have him do more activities independently with supervision such as bill paying, cleaning and cooking but she does majority of IADLs for him despite recommendations from her siblings to allow him to be more independent.  Patient was also questioning potential return to driving.  Denies cognitive impairment and only age-related memory loss.  Patient is overly frustrated due to continued lack of independence as he was previously living independently performing all ADLs and IADLs.  Continues on Eliquis and Crestor for secondary stroke prevention without side effects.  Blood pressure today 139/83.  Denies new or worsening stroke/TIA symptoms.      ROS:   14 system review of systems is positive for mild weakness, all other systems negative  PMH:  Past Medical History:  Diagnosis Date   Arthritis    BPH (benign prostatic hyperplasia)    Chronic kidney disease    had a kidney stone which per family was a cyst that was removed   Dysrhythmia    GERD (gastroesophageal reflux disease)    History of kidney stones    Hyperlipidemia    Hypertension    Splenic vein thrombosis    Stroke ([place]) 05/22/2019   Thrombosis 12/2015   mural  area and no notation of heart attack    Social History:  Social History   Socioeconomic History   Marital status: Widowed    Spouse name: Not on file   Number of children: 6   Years of education: Not on file   Highest education level: Some college, no degree    Occupational History   Occupation: retired    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  Tobacco Use   Smoking status: Former Smoker    Years: 16.00    Types: Cigarettes    Start date: 10/21/1944    Quit date: 10/21/1960    Years since quitting: 59.2   Smokeless tobacco: Never Used  Substance and Sexual Activity   Alcohol use: Yes    Alcohol/week: 3.0 standard drinks    Types: 3 Standard drinks or equivalent per week    Comment: once a week when he goes out to dinner-socially   Drug use: No   Sexual activity: Yes    Partners: Female  Other Topics Concern   Not on file  Social History Narrative   Widow, has a good networks of friends and family    Social Determinants of Radio broadcast assistant Strain:    Difficulty of Paying Living Expenses: Not on file  Food Insecurity:    Worried About Charity fundraiser in the Last Year: Not on file   [place] in the Last Year:  Not on file  Transportation Needs:    Lack of Transportation (Medical): Not on file   Lack of Transportation (Non-Medical): Not on file  Physical Activity: Inactive   Days of Exercise per Week: 0 days   Minutes of Exercise per Session: 0 min  Stress:    Feeling of Stress : Not on file  Social Connections: Unknown   Frequency of Communication with Friends and Family: More than three times a week   Frequency of Social Gatherings with Friends and Family: Not on file   Attends Religious Services: Not on Electrical engineer or Organizations: Not on file   Attends Archivist Meetings: Not on file   Marital Status: Not on file  Intimate Partner Violence:    Fear of Current or Ex-Partner: Not on file   Emotionally Abused: Not on file   Physically Abused: Not on file   Sexually Abused: Not on file    Medications:   Current Outpatient Medications on File Prior to Visit  Medication Sig Dispense Refill   acetaminophen (TYLENOL) 325 MG tablet Take 2  tablets (650 mg total) by mouth every 4 (four) hours as needed for mild pain (or temp > 37.5 C (99.5 F)). (Patient taking differently: Take 650 mg by mouth as needed for mild pain (or temp > 37.5 C (99.5 F)). )     apixaban (ELIQUIS) 2.5 MG TABS tablet Take 1 tablet (2.5 mg total) by mouth 2 (two) times daily. 60 tablet 0   Cholecalciferol (VITAMIN D3) 50 MCG (2000 UT) TABS Take 2,000 Units by mouth daily. 30 tablet 0   finasteride (PROSCAR) 5 MG tablet Take 1 tablet (5 mg total) by mouth daily. 30 tablet 0   metoprolol succinate (TOPROL XL) 25 MG 24 hr tablet Take 1 tablet (25 mg total) by mouth daily. 30 tablet 0   Omega-3 Fatty Acids (FISH OIL) 1000 MG CAPS Take 1,000 mg by mouth daily.      pantoprazole (PROTONIX) 40 MG tablet Take 1 tablet (40 mg total) by mouth daily. 90 tablet 1   rosuvastatin (CRESTOR) 40 MG tablet Take 1 tablet (40 mg total) by mouth daily at 6 PM. 30 tablet 0   No current facility-administered medications on file prior to visit.    Allergies:   Allergies  Allergen Reactions   Penicillins Itching and Swelling    Has patient had a PCN reaction causing immediate rash, facial/tongue/throat swelling, SOB or lightheadedness with hypotension: No Has patient had a PCN reaction causing severe rash involving mucus membranes or skin necrosis: No Has patient had a PCN reaction that required hospitalization: No Has patient had a PCN reaction occurring within the last 10 years: No If all of the above answers are "NO", then may proceed with Cephalosporin use.     Physical Exam  Today's Vitals   01/17/20 1317  BP: 128/64  Pulse: 70  Temp: (!) 97.3 F (36.3 C)  TempSrc: Oral  Weight: 168 lb 6.4 oz (76.4 kg)  Height: 5' 10.5" (1.791 m)   Body mass index is 23.82 kg/m.  General: Pleasant elderly Caucasian male seated, in no evident distress Head: head normocephalic and atraumatic.   Neck: supple with no carotid or supraclavicular bruits Cardiovascular:  regular rate and rhythm Musculoskeletal: no deformity Skin:  no rash/petichiae.  Vascular:  Normal pulses all extremities  Neurologic Exam Mental Status: Awake and fully alert.  Normal speech and language.  Oriented to place and time. Recent and  remote memory intact. Attention span, concentration and fund of knowledge appropriate. Mood and affect appropriate.  Cranial Nerves: Pupils unequal (chronic), briskly reactive to light. Extraocular movements full without nystagmus. Visual fields full to confrontation. Hearing diminished bilaterally. Facial sensation intact. Face, tongue, palate moves normally and symmetrically.  Motor: Normal bulk and tone.   LUE: 4+/5 with decreased left hand dexterity LLE: 5/5 Full strength right upper and lower extremity Sensory.: intact to touch , pinprick , position and vibratory sensation.  Coordination: Rapid alternating movements normal in all extremities except mildly decreased left hand.  Orbits his right arm over left arm.  Finger-to-nose and heel-to-shin performed mildly impaired on the left.. Gait and Station: Arises from chair without difficulty. Stance is slightly hunched. Gait demonstrates slow cautious steps with use of rolling walker and decreased balance Reflexes: 1+ and symmetric. Toes downgoing.        ASSESSMENT: 86 year old Caucasian male with embolic right posterior temporal infarct in June 2020 secondary to new onset atrial flutter.  Remote history of splenic infarct and vascular risk factors of hypertension hyperlipidemia and atrial flutter.  Residual deficits of mild LUE weakness, gait impairment and left visual field neglect     PLAN: -Continue Eliquis and Crestor for secondary stroke prevention -Continue to follow with PCP for HTN and HLD management -Continue to follow with cardiology as scheduled  -Recommend continued participation with outpatient therapies with ongoing improvement and ensure use of rolling walker at all times  unless instructed otherwise -maintain strict control of hypertension with blood pressure goal below 130/90, diabetes with hemoglobin A1c goal below 6.5% and lipids with LDL cholesterol goal below 70 mg/dL. I also advised the patient to eat a healthy diet with plenty of whole grains, cereals, fruits and vegetables, exercise regularly and maintain ideal body weight.   Follow-up in 4 months or call earlier if needed  Greater than 50% time during this 20-minute visit was spent on counseling and coordination of care about embolic stroke and atrial flutter, discussion regarding residual deficits and safety concerns, discussion regarding importance of secondary stroke risk factor management and answering all questions to patient and daughter satisfaction   Frann Rider, AGNP-BC  [facility] [address] [place], [place] [address]  Phone [phone] Fax [phone] Note: This document was prepared with digital dictation and possible smart phrase technology. Any transcriptional errors that result from this process are unintentional.

## 2020-01-17 NOTE — Patient Instructions (Signed)
Continue Eliquis (apixaban) daily  and Crestor  for secondary stroke prevention  Continue to follow up with PCP regarding cholesterol and blood pressure management   Continue to follow with cardiology for Eliquis and atrial fibrillation management  Continue to participate in outpatient therapy for ongoing improvement   Continue to monitor blood pressure at home  Maintain strict control of hypertension with blood pressure goal below 130/90, diabetes with hemoglobin A1c goal below 6.5% and cholesterol with LDL cholesterol (bad cholesterol) goal below 70 mg/dL. I also advised the patient to eat a healthy diet with plenty of whole grains, cereals, fruits and vegetables, exercise regularly and maintain ideal body weight.  Followup in the future with me in 4 months or call earlier if needed       Thank you for coming to see [place] at [facility]. I hope we have been able to provide you high quality care today.  You may receive a patient satisfaction survey over the next few weeks. We would appreciate your feedback and comments so that we may continue to improve ourselves and the health of our patients.

## 2020-01-19 ENCOUNTER — Encounter: Payer: Medicare Other | Admitting: Occupational Therapy

## 2020-01-19 ENCOUNTER — Ambulatory Visit: Payer: Medicare Other | Admitting: Physical Therapy

## 2020-01-19 NOTE — Progress Notes (Signed)
I agree with the above plan 

## 2020-01-23 ENCOUNTER — Ambulatory Visit: Payer: Medicare Other | Attending: Internal Medicine

## 2020-01-23 DIAGNOSIS — Z23 Encounter for immunization: Secondary | ICD-10-CM | POA: Insufficient documentation

## 2020-01-23 NOTE — Progress Notes (Signed)
   Covid-19 Vaccination Clinic  Name:  Jose Castro    MRN: [id number] DOB: 24-Mar-1926  01/23/2020  [name] was observed post Covid-19 immunization for 15 minutes without incidence. He was provided with Vaccine Information Sheet and instruction to access the V-Safe system.   [name] was instructed to call 911 with any severe reactions post vaccine: Marland Kitchen Difficulty breathing  . Swelling of your face and throat  . A fast heartbeat  . A bad rash all over your body  . Dizziness and weakness    Immunizations Administered    Name Date Dose VIS Date Route   Moderna COVID-19 Vaccine 01/23/2020  2:16 PM 0.5 mL 11/01/2019 Intramuscular   Manufacturer: Moderna   Lot: [id number]   [place]: [phone]

## 2020-01-24 ENCOUNTER — Ambulatory Visit: Payer: Medicare Other | Admitting: Occupational Therapy

## 2020-01-24 ENCOUNTER — Encounter: Payer: Self-pay | Admitting: Occupational Therapy

## 2020-01-24 ENCOUNTER — Other Ambulatory Visit: Payer: Self-pay

## 2020-01-24 ENCOUNTER — Ambulatory Visit: Payer: Medicare Other

## 2020-01-24 DIAGNOSIS — M6281 Muscle weakness (generalized): Secondary | ICD-10-CM

## 2020-01-24 DIAGNOSIS — R278 Other lack of coordination: Secondary | ICD-10-CM

## 2020-01-24 DIAGNOSIS — R2681 Unsteadiness on feet: Secondary | ICD-10-CM

## 2020-01-24 NOTE — Therapy (Signed)
[place] MAIN [facility] SERVICES [address] [place], [place], [address] Phone: [phone]   Fax:  [phone]  Physical Therapy Treatment  Patient Details  Name: Jose Castro MRN: [id number] Date of Birth: Feb 28, 1926 Referring Provider (PT): Dr. Letta Pate   Encounter Date: 01/24/2020  PT End of Session - 01/24/20 1446    Visit Number  29    Number of Visits  49    Date for PT Re-Evaluation  03/06/20    PT Start Time  1430    PT Stop Time  1515    PT Time Calculation (min)  45 min    Equipment Utilized During Treatment  Gait belt    Activity Tolerance  Patient tolerated treatment well;Patient limited by pain   L knee pain   Behavior During Therapy  [facility] for tasks assessed/performed       Past Medical History:  Diagnosis Date  . Arthritis   . BPH (benign prostatic hyperplasia)   . Chronic kidney disease    had a kidney stone which per family was a cyst that was removed  . Dysrhythmia   . GERD (gastroesophageal reflux disease)   . History of kidney stones   . Hyperlipidemia   . Hypertension   . Splenic vein thrombosis   . Stroke ([place]) 05/22/2019  . Thrombosis 12/2015   mural  area and no notation of heart attack    Past Surgical History:  Procedure Laterality Date  . APPENDECTOMY    . CATARACT EXTRACTION, BILATERAL    . CYSTOSCOPY WITH INSERTION OF UROLIFT    . KIDNEY STONE SURGERY    . KNEE SURGERY    . PACEMAKER INSERTION N/A 07/13/2018   Procedure: INSERTION PACEMAKER-DUEL CHAMBER INITIAL IMPLANT;  Surgeon: Isaias Cowman, MD;  Location: [facility] ORS;  Service: Cardiovascular;  Laterality: N/A;  . SPINE SURGERY      There were no vitals filed for this visit.  Subjective Assessment - 01/24/20 1444    Subjective  Pt reports doing well. He is complianing of some R posterior knee/calf pain today. It is not bothering him at rest currently but he is having some pain with ambulation. No additional updates at this time. No  specific questions or concerns currently.    Pertinent History  Pt is a 86 year old male who presents with imbalance and difficulty with gait following a right frontal CVA with left hemiparesis on 05/22/19.  He has completed inpatient and home health PT, and now presents for OP PT.  He was discharged from inpatient rehab at a supervision level of assistance, ambulating with a RW.  His family notes that he is impulsive and will try to ambulate without his RW at home.  Family is currently providing 24/7 supervision.    How long can you sit comfortably?  no limitations    How long can you walk comfortably?  he requires a RW at home    Patient Stated Goals  get back to normal; want to be able to play golf and go out to play trivia again.  His daughters want him to regain independence with going to the bathroom at night    Currently in Pain?  No/denies   No resting pain but reports some pain with ambulation in posterior R knee/calf        TREATMENT   Neuromuscular Re-education Dynamic balance during ambulation withsingle point caneapproximately400'today. Ptperformedhorizontal and vertical head turns with cues from therapist as well as gait speed changes. Extensive conversation  with patient to challenge his ability to walk and talk but he does demonstrate significant slowing/stopping during conversation. Appears slightly more unsteady today however no LOB.  6" alternating step taps without UE support x 10 each; Ascend/descend 4 steps with BLE support on rails x 2; Airex 6" alternating step taps without UE support x 10 each; Airex NBOS eyes closed x 30s; Airex NBOS dynamic reaching for cones with therapist challenging direction and distance;   Therapeutic Exercise Seated hip flexion marchingwith4# ankle weightx 20 bilateral; Seated LAQ with 4# ankle weight x 20 bilateral; Seated HS curls with green tband x 20 bilateral; Seated clams withgreen tband resistancex 20; Seated  adductor squeeze with ball between knees 3s hold x 20;   Pt educated throughout session about proper posture and technique with exercises. Improved exercise technique, movement at target joints, use of target muscles after min to mod verbal, visual, tactile cues.   Ptdemonstratesexcellent motivation during session today.His R calf is non-tender and compressible. Pt denies any increase in his calf pain during session. Heis able to again perform dynamic balance activities during gait but does demonstrate slightly more unsteadiness today however no LOB. Continued with seated and standing strengthening today. Incorporated unstable surfaces as well as dynamic reaching tasks to challenge his balance. He will need updated outcome measures, goals, and a progress note at next visit. Pt encouraged to continue HEP and follow-up as scheduled.Pt will benefit from skilled PT services to address deficits in balance and decrease risk for future falls.                        PT Short Term Goals - 12/13/19 1613      PT SHORT TERM GOAL #1   Title  Pt will be independent with HEP in order to improve strength and balance in order to decrease fall risk and improve function at home and work.    Time  6    Period  Weeks    Status  On-going    Target Date  01/24/20        PT Long Term Goals - 12/13/19 1613      PT LONG TERM GOAL #1   Title  Pt will improve BERG to >50 in order to decrease his fall risk.    Baseline  09/21/19: 42/56, 11/03/19: 47/56; 12/13/19: 46/56    Time  12    Period  Weeks    Status  Partially Met    Target Date  03/06/20      PT LONG TERM GOAL #2   Title  Pt will decrease TUG to below 14 seconds/decrease in order to demonstrate decreased fall risk.    Baseline  09/21/19: 22.19s; 11/03/19: 19.0s; 12/13/19: 17.0s    Time  12    Period  Weeks    Status  Partially Met    Target Date  03/06/20      PT LONG TERM GOAL #3   Title  Pt will decrease 5TSTS by at  least 3 seconds in order to demonstrate clinically significant improvement in LE strength.    Baseline  09/21/19: 32.28s; 11/03/19: 15.5s; 12/13/19: 14.2s    Time  12    Period  Weeks    Status  Achieved      PT LONG TERM GOAL #4   Title  Pt will increase his 10MWT to >1.0 m/s in order to demonstrate a clinically significant improvement in gait speed and improved community ambulation  Baseline  09/21/19: Self-selected: 20.9 s = 0.48 m/s; Fastest: 17.1s =0.58 m/s; 11/03/19: Self-selected: 17.7s s = 0.56 m/s; Fastest: 13.5s =0.74 m/s with walker; 12/13/19: Self-selected:  12.6s =  0.79 m/s; Fastest: 12.2 = 0.82 m/s with walker    Time  12    Period  Weeks    Status  Revised    Target Date  03/06/20            Plan - 01/24/20 1446    Clinical Impression Statement  Pt demonstrates excellent motivation during session today. His R calf is non-tender and compressible. Pt denies any increase in his calf pain during session. He is able to again perform dynamic balance activities during gait but does demonstrate slightly more unsteadiness today however no LOB. Continued with seated and standing strengthening today. Incorporated unstable surfaces as well as dynamic reaching tasks to challenge his balance. He will need updated outcome measures, goals, and a progress note at next visit. Pt encouraged to continue HEP and follow-up as scheduled. Pt will benefit from skilled PT services to address deficits in balance and decrease risk for future falls.    Personal Factors and Comorbidities  Age;Comorbidity 2    Comorbidities  HTN, CVA    Examination-Activity Limitations  Bend;Lift;Squat;Locomotion Level;Stairs;Stand    Examination-Participation Restrictions  Community Activity;Driving    Stability/Clinical Decision Making  Evolving/Moderate complexity    Rehab Potential  Good    PT Frequency  2x / week    PT Duration  12 weeks    PT Treatment/Interventions  ADLs/Self Care Home Management;Canalith  Repostioning;Cryotherapy;Electrical Stimulation;Iontophoresis '4mg'$ /ml Dexamethasone;Moist Heat;Traction;Ultrasound;DME Instruction;Gait training;Stair training;Therapeutic activities;Therapeutic exercise;Functional mobility training;Balance training;Neuromuscular re-education;Patient/family education;Manual techniques;Dry needling;Vestibular;Joint Manipulations;Spinal Manipulations    PT Next Visit Plan  Update outcome measures/goals, progress note, progress strength and balance as well as gait with spc    PT Home Exercise Plan  Access Code: [email or web address]    Consulted and Agree with Plan of Care  Patient;Family member/caregiver       Patient will benefit from skilled therapeutic intervention in order to improve the following deficits and impairments:  Abnormal gait, Decreased balance, Decreased endurance, Decreased mobility, Difficulty walking, Decreased knowledge of precautions, Decreased safety awareness, Decreased activity tolerance, Decreased strength, Hypermobility  Visit Diagnosis: Muscle weakness (generalized)  Unsteadiness on feet     Problem List Patient Active Problem List   Diagnosis Date Noted  . Chronic venous insufficiency 12/05/2019  . 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 Great toe pain, right   . Lethargy   . CKD (chronic kidney disease), stage II   . Leukocytosis   . Benign essential HTN   . New onset atrial fibrillation ([place])   . Dysphagia, post-stroke   . 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 Acute ischemic right MCA stroke ([place]) 05/25/2019  . Goals of care, counseling/discussion   . Palliative care by specialist   . DNR (do not resuscitate) discussion   . Stroke, acute, embolic ([place]) 95/08/3266  . Persistent proteinuria 07/30/2018  . 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 Chronic kidney disease, stage III (moderate) 06/15/2018  . Leg pain 05/06/2018  . Lymphedema 05/06/2018  . Coagulopathy ([place]) 07/07/2017  . Lumbar  spondylosis 03/10/2017  . Elevated uric acid in blood 11/20/2016  . BPH (benign prostatic hyperplasia) 05/29/2016  . Peripheral vascular disease of lower extremity ([place]) 03/03/2016  . Gallstone 12/31/2015  . 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  Atherosclerosis of aorta ([place]) 11/22/2015  . Carotid artery narrowing 11/22/2015  . Diverticulosis of colon 11/22/2015  . Decreased creatinine clearance 11/22/2015  . Arthritis, degenerative 11/22/2015  . Lactose intolerance 11/22/2015  . Basal cell carcinoma 11/22/2015  . Essential hypertension 06/21/2015  . Hyperlipemia 06/21/2015  . GERD (gastroesophageal reflux disease) 06/21/2015  . Calculus of kidney 11/18/2013   Phillips Grout PT, DPT, GCS  Samreet Edenfield 01/25/2020, 1:09 PM  [place] MAIN [facility] SERVICES [address] [place], [place], [address] Phone: [phone]   Fax:  [phone]  Name: AIZIK REH MRN: [id number] Date of Birth: 1926-02-11

## 2020-01-24 NOTE — Therapy (Signed)
[place] MAIN [facility] SERVICES [address] [place], [place], [address] Phone: [phone]   Fax:  [phone]  Occupational Therapy Treatment  Patient Details  Name: Jose Castro MRN: [id number] Date of Birth: 29-Feb-1926 Referring Provider (OT): Steele Sizer   Encounter Date: 01/24/2020  OT End of Session - 01/24/20 1531    Visit Number  23    Number of Visits  26    Date for OT Re-Evaluation  01/27/20    OT Start Time  1430    OT Stop Time  1515    OT Time Calculation (min)  45 min    Activity Tolerance  Patient tolerated treatment well    Behavior During Therapy  [facility] for tasks assessed/performed       Past Medical History:  Diagnosis Date  . Arthritis   . BPH (benign prostatic hyperplasia)   . Chronic kidney disease    had a kidney stone which per family was a cyst that was removed  . Dysrhythmia   . GERD (gastroesophageal reflux disease)   . History of kidney stones   . Hyperlipidemia   . Hypertension   . Splenic vein thrombosis   . Stroke ([place]) 05/22/2019  . Thrombosis 12/2015   mural  area and no notation of heart attack    Past Surgical History:  Procedure Laterality Date  . APPENDECTOMY    . CATARACT EXTRACTION, BILATERAL    . CYSTOSCOPY WITH INSERTION OF UROLIFT    . KIDNEY STONE SURGERY    . KNEE SURGERY    . PACEMAKER INSERTION N/A 07/13/2018   Procedure: INSERTION PACEMAKER-DUEL CHAMBER INITIAL IMPLANT;  Surgeon: Isaias Cowman, MD;  Location: [facility] ORS;  Service: Cardiovascular;  Laterality: N/A;  . SPINE SURGERY      There were no vitals filed for this visit.  Subjective Assessment - 01/24/20 1529    Subjective   Pt. reports that the doctor signed off  of his care.    Pertinent History  Pt. is a 86 y.o. male who was admitted to [facility] with a [facility] DIstribution Infarct on 05/21/2019. Pt. went through Inpatient Rehab, and had home health OT, PT, and ST services. Pt. was previously residing  home alone, and was independent with ADLs, and IADLs. Pt. now has family staying with the pt. around the clock.    Patient Stated Goals  to regain functional independence safely    Currently in Pain?  No/denies       OT TREATMENT    Neuro muscular re-education:  Pt. performed left [facility] tasks using the Grooved pegboard. Pt. worked on grasping the grooved pegs from a horizontal position, and moving the pegs to a vertical position in the hand to prepare for placing them in the grooved slot. Pt. Required cues for turning the pegs into the direction to fit the pegboard. Worked on grasping one inch resistive cubes with his thumb and 2nd digit. The board was positioned at a vertical angle to encourage wrist extension.   Therapeutic Exercise:  Pt. worked on pinch strengthening in the left hand for lateral, and 3pt. pinch using yellow, red, green, blue, and black resistive clips. Pt. worked on placing the clips at various vertical and horizontal angles. Tactile and verbal cues were required for eliciting the desired movement.  Response to Treatment:  Pt. Reported that he had the first dose of the vaccine yesterday, and that his left arm is sore. Pt. reports that he had an appointment  with Dr. Letta Pate last week, and has finished with his care. Pt. has made steady progress overall. Pt. continues  to make steady progress, and continues to work on improving LUE strength, and [facility] skills. Pt. requires cues for redirection at times, form, and technique. Pt. continues to work on improving LUE functioning in order to improve, and maximize independence with ADLs, and IADL tasks. Reassessment next visit.                      OT Education - 01/24/20 1531    Education Details  left pinch strength, and [facility]    Person(s) Educated  Patient    Methods  Explanation;Demonstration    Comprehension  Verbalized understanding;Returned demonstration;Verbal cues required          OT Long Term Goals  - 12/16/19 1444      OT LONG TERM GOAL #1   Title  Pt. will demonstrate home management tasks with supervision initating safe strategies    Baseline  Eval: Decreased safety awareness, 12/13/19 min assist with home management tasks    Time  12    Period  Weeks    Status  On-going    Target Date  01/27/20      OT LONG TERM GOAL #2   Title  Pt. will be able to identify 100% of potential safety hazards in a simulated home environment during ADLs, and IADLs.    Baseline  Eval: Limited safety awareness    Time  12    Period  Weeks    Status  Achieved      OT LONG TERM GOAL #3   Title  Pt. will increase left grip strength by 5# of force to assist with holding items duirng ADLs, and IADLs.    Baseline  Eval: decreased left grip strength, 12/13/19 increased by 4#    Time  12    Period  Weeks    Status  On-going    Target Date  01/27/20      OT LONG TERM GOAL #4   Title  Pt. will improve left hand [facility] skills by 3 sec of speed to be able to manipulate objects during ADLs, and IADL tasks.    Baseline  improved by 2 secs 12/13/19    Time  12    Period  Weeks    Status  On-going    Target Date  01/27/20      OT LONG TERM GOAL #5   Title  Pt. will demonstrate visual compensatory strategies as needed during ADLs, and IADL tasks.    Baseline  Eval: Limited left sided awareness, 12/13/19 improved but continues to require some min cues with IADL tasks    Time  12    Period  Weeks    Status  On-going    Target Date  01/27/20      OT LONG TERM GOAL #6   Title  Pt. will demonstrate cognitive compensatory strategies during ADLs, and IADLs    Time  12    Period  Weeks    Status  Achieved            Plan - 01/24/20 1532    Clinical Impression Statement Pt. Reported that he had the first dose of the vaccine yesterday, and that his left arm is sore. Pt. reports that he had an appointment with Dr. Letta Pate last week, and has finished with his care. Pt. continues  to make steady progress, and  continues  to work on improving LUE strength, and [facility] skills. Pt. requires cues for form, and technique. Pt. continues to work on improving UE functioning in order to improve, and maximize independence with ADLs, and IADL tasks.    OT Occupational Profile and History  Detailed Assessment- Review of Records and additional review of physical, cognitive, psychosocial history related to current functional performance    Occupational performance deficits (Please refer to evaluation for details):  ADL's;IADL's    Body Structure / Function / Physical Skills  ADL;FMC;ROM;IADL;Strength;Decreased knowledge of use of DME;UE functional use    Rehab Potential  Excellent    Clinical Decision Making  Several treatment options, min-mod task modification necessary    Comorbidities Affecting Occupational Performance:  May have comorbidities impacting occupational performance    OT Frequency  1x / week    OT Duration  6 weeks    OT Treatment/Interventions  Self-care/ADL training;DME and/or AE instruction;Therapeutic exercise;Neuromuscular education;Patient/family education;Therapeutic activities    Consulted and Agree with Plan of Care  Patient       Patient will benefit from skilled therapeutic intervention in order to improve the following deficits and impairments:   Body Structure / Function / Physical Skills: ADL, FMC, ROM, IADL, Strength, Decreased knowledge of use of DME, UE functional use       Visit Diagnosis: Muscle weakness (generalized)  Other lack of coordination    Problem List Patient Active Problem List   Diagnosis Date Noted  . Chronic venous insufficiency 12/05/2019  . 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 Great toe pain, right   . Lethargy   . CKD (chronic kidney disease), stage II   . Leukocytosis   . Benign essential HTN   . New onset atrial fibrillation ([place])   . Dysphagia, post-stroke   . 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 Acute ischemic right  MCA stroke ([place]) 05/25/2019  . Goals of care, counseling/discussion   . Palliative care by specialist   . DNR (do not resuscitate) discussion   . Stroke, acute, embolic ([place]) 62/94/7654  . Persistent proteinuria 07/30/2018  . 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 Chronic kidney disease, stage III (moderate) 06/15/2018  . Leg pain 05/06/2018  . Lymphedema 05/06/2018  . Coagulopathy ([place]) 07/07/2017  . Lumbar spondylosis 03/10/2017  . Elevated uric acid in blood 11/20/2016  . BPH (benign prostatic hyperplasia) 05/29/2016  . Peripheral vascular disease of lower extremity ([place]) 03/03/2016  . Gallstone 12/31/2015  . 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 Atherosclerosis of aorta ([place]) 11/22/2015  . Carotid artery narrowing 11/22/2015  . Diverticulosis of colon 11/22/2015  . Decreased creatinine clearance 11/22/2015  . Arthritis, degenerative 11/22/2015  . Lactose intolerance 11/22/2015  . Basal cell carcinoma 11/22/2015  . Essential hypertension 06/21/2015  . Hyperlipemia 06/21/2015  . GERD (gastroesophageal reflux disease) 06/21/2015  . Calculus of kidney 11/18/2013    Harrel Carina, MS, OTR/L 01/24/2020, 3:41 PM  [place] MAIN [facility] SERVICES [address] [place], [place], [address] Phone: [phone]   Fax:  [phone]  Name: Jose Castro MRN: [id number] Date of Birth: Apr 13, 1926

## 2020-01-26 ENCOUNTER — Encounter: Payer: Medicare Other | Admitting: Occupational Therapy

## 2020-01-26 ENCOUNTER — Ambulatory Visit: Payer: Medicare Other

## 2020-01-26 ENCOUNTER — Other Ambulatory Visit: Payer: Self-pay

## 2020-01-26 DIAGNOSIS — R2681 Unsteadiness on feet: Secondary | ICD-10-CM

## 2020-01-26 DIAGNOSIS — M6281 Muscle weakness (generalized): Secondary | ICD-10-CM

## 2020-01-26 NOTE — Therapy (Signed)
[place] MAIN [facility] SERVICES [address] [place], [place], [address] Phone: [phone]   Fax:  [phone]  Physical Therapy Progress Note   Dates of reporting period  12/13/19   to   01/26/20   Patient Details  Name: Jose Castro MRN: [id number] Date of Birth: 1926-09-28 Referring Provider (PT): Dr. Letta Pate   Encounter Date: 01/26/2020  PT End of Session - 01/26/20 1149    Visit Number  30    Number of Visits  49    Date for PT Re-Evaluation  03/06/20    PT Start Time  1102    PT Stop Time  1145    PT Time Calculation (min)  43 min    Equipment Utilized During Treatment  Gait belt    Activity Tolerance  Patient tolerated treatment well;Patient limited by pain   L knee pain   Behavior During Therapy  [facility] for tasks assessed/performed       Past Medical History:  Diagnosis Date  . Arthritis   . BPH (benign prostatic hyperplasia)   . Chronic kidney disease    had a kidney stone which per family was a cyst that was removed  . Dysrhythmia   . GERD (gastroesophageal reflux disease)   . History of kidney stones   . Hyperlipidemia   . Hypertension   . Splenic vein thrombosis   . Stroke ([place]) 05/22/2019  . Thrombosis 12/2015   mural  area and no notation of heart attack    Past Surgical History:  Procedure Laterality Date  . APPENDECTOMY    . CATARACT EXTRACTION, BILATERAL    . CYSTOSCOPY WITH INSERTION OF UROLIFT    . KIDNEY STONE SURGERY    . KNEE SURGERY    . PACEMAKER INSERTION N/A 07/13/2018   Procedure: INSERTION PACEMAKER-DUEL CHAMBER INITIAL IMPLANT;  Surgeon: Isaias Cowman, MD;  Location: [facility] ORS;  Service: Cardiovascular;  Laterality: N/A;  . SPINE SURGERY      There were no vitals filed for this visit.  Subjective Assessment - 01/26/20 1251    Subjective  Pt reports doing well. He is not having any further R calf pain today. Chronic L knee pain with activity but not at rest. No additional updates at this  time. No specific questions or concerns currently.    Pertinent History  Pt is a 86 year old male who presents with imbalance and difficulty with gait following a right frontal CVA with left hemiparesis on 05/22/19.  He has completed inpatient and home health PT, and now presents for OP PT.  He was discharged from inpatient rehab at a supervision level of assistance, ambulating with a RW.  His family notes that he is impulsive and will try to ambulate without his RW at home.  Family is currently providing 24/7 supervision.    How long can you sit comfortably?  no limitations    How long can you walk comfortably?  he requires a RW at home    Patient Stated Goals  get back to normal; want to be able to play golf and go out to play trivia again.  His daughters want him to regain independence with going to the bathroom at night    Currently in Pain?  No/denies         TREATMENT   Neuromuscular Re-education Forward/retro ambulation in // bars without UE support x 4 lengths each direction; Side stepping in // bars without UE support x 2 lengths each direction; 6" orange  hurdle steps forward/backward, pt only able to perform with R leg in single leg stance due to L knee pain when attempted on L side x 10; 1/2 foam roll tandem balance alternating forward LE without UE support x 30s each; 1/2 foam roll A/P balance without UE support x 30s;   Dynamic balance during ambulation withsingle point caneapproximately400'today. Ptperformedhorizontal and vertical head turns with cues from therapist as well as gait speed changes. Extensive conversation with patient to challenge his ability to walk and talk but he does demonstrate significant slowing/stopping during conversation. Appears slightly more unsteady today however no LOB.  6" alternating step taps without UE support x 10 each;  Airex 6" alternating step taps without UE support x 10 each; Airex NBOS eyes closed x 30s; Airex NBOS dynamic  reaching for cones with therapist challenging direction and distance;   Therapeutic Exercise NuStep L2-4 x 5 minutes for warm-up (unbilled); Standing hip flexion marchingwith5# ankle weight (AW)x20bilateral; Standing hip abduction with 5# AW x 20 bilateral; Standing hip extension with 5# AW x 20 bilateral; Seated LAQ with4# ankle weight x20bilateral; Seated HS curls with green tband x20bilateral; Seated clams withgreen tband resistancex20; Seated adductor squeeze withball between knees 3s hold x 20;   Pt educated throughout session about proper posture and technique with exercises. Improved exercise technique, movement at target joints, use of target muscles after min to mod verbal, visual, tactile cues.   Outcome measures deferred today as pt has been a little less stable this week. Will perform them next week. Overall pt demonstratesimprovement in his balance and strength. His sessions remain limited by his chronic L knee pain. He is now ambulating around the home without contact guard by family with the use of a rolling walker but is still not safe to transition to a single point cane.His reaction time is limited and given that he is on a blood thinner it could be very serious if he were to fall. Pt encouraged to continue his HEP. Hewill continue to benefit from skilled PT services to address deficits in balance and decrease risk for future falls.                         PT Short Term Goals - 01/26/20 1253      PT SHORT TERM GOAL #1   Title  Pt will be independent with HEP in order to improve strength and balance in order to decrease fall risk and improve function at home and work.    Time  6    Period  Weeks    Status  On-going    Target Date  01/24/20        PT Long Term Goals - 01/26/20 1253      PT LONG TERM GOAL #1   Title  Pt will improve BERG to >50 in order to decrease his fall risk.    Baseline  09/21/19: 42/56, 11/03/19:  47/56; 12/13/19: 46/56    Time  12    Period  Weeks    Status  Deferred    Target Date  03/06/20      PT LONG TERM GOAL #2   Title  Pt will decrease TUG to below 14 seconds/decrease in order to demonstrate decreased fall risk.    Baseline  09/21/19: 22.19s; 11/03/19: 19.0s; 12/13/19: 17.0s    Time  12    Period  Weeks    Status  Deferred    Target Date  03/06/20  PT LONG TERM GOAL #3   Title  Pt will decrease 5TSTS by at least 3 seconds in order to demonstrate clinically significant improvement in LE strength.    Baseline  09/21/19: 32.28s; 11/03/19: 15.5s; 12/13/19: 14.2s    Time  12    Period  Weeks    Status  Achieved      PT LONG TERM GOAL #4   Title  Pt will increase his 10MWT to >1.0 m/s in order to demonstrate a clinically significant improvement in gait speed and improved community ambulation    Baseline  09/21/19: Self-selected: 20.9 s = 0.48 m/s; Fastest: 17.1s =0.58 m/s; 11/03/19: Self-selected: 17.7s s = 0.56 m/s; Fastest: 13.5s =0.74 m/s with walker; 12/13/19: Self-selected:  12.6s =  0.79 m/s; Fastest: 12.2 = 0.82 m/s with walker    Time  12    Period  Weeks    Status  Deferred    Target Date  03/06/20            Plan - 01/26/20 1252    Clinical Impression Statement  Outcome measures deferred today as pt has been a little less stable this week. Will perform them next week. Overall pt demonstrates improvement in his balance and strength. His sessions remain limited by his chronic L knee pain. He is now ambulating around the home without contact guard by family with the use of a rolling walker but is still not safe to transition to a single point cane. His reaction time is limited and given that he is on a blood thinner it could be very serious if he were to fall. Pt encouraged to continue his HEP. He will continue to benefit from skilled PT services to address deficits in balance and decrease risk for future falls.    Personal Factors and Comorbidities  Age;Comorbidity  2    Comorbidities  HTN, CVA    Examination-Activity Limitations  Bend;Lift;Squat;Locomotion Level;Stairs;Stand    Examination-Participation Restrictions  Community Activity;Driving    Stability/Clinical Decision Making  Evolving/Moderate complexity    Rehab Potential  Good    PT Frequency  2x / week    PT Duration  12 weeks    PT Treatment/Interventions  ADLs/Self Care Home Management;Canalith Repostioning;Cryotherapy;Electrical Stimulation;Iontophoresis 4mg /ml Dexamethasone;Moist Heat;Traction;Ultrasound;DME Instruction;Gait training;Stair training;Therapeutic activities;Therapeutic exercise;Functional mobility training;Balance training;Neuromuscular re-education;Patient/family education;Manual techniques;Dry needling;Vestibular;Joint Manipulations;Spinal Manipulations    PT Next Visit Plan  Update outcome measures/goals, progress strength and balance as well as gait with spc    PT Home Exercise Plan  Access Code: [email or web address]    Consulted and Agree with Plan of Care  Patient;Family member/caregiver       Patient will benefit from skilled therapeutic intervention in order to improve the following deficits and impairments:  Abnormal gait, Decreased balance, Decreased endurance, Decreased mobility, Difficulty walking, Decreased knowledge of precautions, Decreased safety awareness, Decreased activity tolerance, Decreased strength, Hypermobility  Visit Diagnosis: Muscle weakness (generalized)  Unsteadiness on feet     Problem List Patient Active Problem List   Diagnosis Date Noted  . Chronic venous insufficiency 12/05/2019  . 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 Great toe pain, right   . Lethargy   . CKD (chronic kidney disease), stage II   . Leukocytosis   . Benign essential HTN   . New onset atrial fibrillation ([place])   . Dysphagia, post-stroke   . 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 Acute ischemic right MCA stroke ([place]) 05/25/2019  . Goals of care,  counseling/discussion   . Palliative care by specialist   . DNR (do not resuscitate) discussion   . Stroke, acute, embolic ([place]) 74/45/1460  . Persistent proteinuria 07/30/2018  . 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 Chronic kidney disease, stage III (moderate) 06/15/2018  . Leg pain 05/06/2018  . Lymphedema 05/06/2018  . Coagulopathy ([place]) 07/07/2017  . Lumbar spondylosis 03/10/2017  . Elevated uric acid in blood 11/20/2016  . BPH (benign prostatic hyperplasia) 05/29/2016  . Peripheral vascular disease of lower extremity ([place]) 03/03/2016  . Gallstone 12/31/2015  . 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 Atherosclerosis of aorta ([place]) 11/22/2015  . Carotid artery narrowing 11/22/2015  . Diverticulosis of colon 11/22/2015  . Decreased creatinine clearance 11/22/2015  . Arthritis, degenerative 11/22/2015  . Lactose intolerance 11/22/2015  . Basal cell carcinoma 11/22/2015  . Essential hypertension 06/21/2015  . Hyperlipemia 06/21/2015  . GERD (gastroesophageal reflux disease) 06/21/2015  . Calculus of kidney 11/18/2013   Phillips Grout PT, DPT, GCS  Folasade Mooty 01/26/2020, 1:00 PM  [place] MAIN [facility] SERVICES [address] [place], [place], [address] Phone: [phone]   Fax:  [phone]  Name: Jose Castro MRN: [id number] Date of Birth: 03-28-1926

## 2020-01-31 ENCOUNTER — Ambulatory Visit: Payer: Medicare Other | Admitting: Occupational Therapy

## 2020-01-31 ENCOUNTER — Ambulatory Visit: Payer: Medicare Other | Attending: Physical Medicine & Rehabilitation

## 2020-01-31 ENCOUNTER — Other Ambulatory Visit: Payer: Self-pay

## 2020-01-31 DIAGNOSIS — M6281 Muscle weakness (generalized): Secondary | ICD-10-CM | POA: Insufficient documentation

## 2020-01-31 DIAGNOSIS — R278 Other lack of coordination: Secondary | ICD-10-CM

## 2020-01-31 DIAGNOSIS — R2689 Other abnormalities of gait and mobility: Secondary | ICD-10-CM | POA: Insufficient documentation

## 2020-01-31 DIAGNOSIS — R2681 Unsteadiness on feet: Secondary | ICD-10-CM | POA: Diagnosis present

## 2020-01-31 DIAGNOSIS — R29898 Other symptoms and signs involving the musculoskeletal system: Secondary | ICD-10-CM | POA: Diagnosis present

## 2020-01-31 NOTE — Therapy (Signed)
[place] MAIN [facility] SERVICES [address] [place], [place], [address] Phone: [phone]   Fax:  [phone]  Physical Therapy Treatment  Patient Details  Name: Jose Castro MRN: [id number] Date of Birth: Mar 01, 1926 Referring Provider (PT): Dr. Letta Pate   Encounter Date: 01/31/2020  PT End of Session - 02/01/20 0757    Visit Number  31    Number of Visits  49    Date for PT Re-Evaluation  03/06/20    PT Start Time  1301    PT Stop Time  1345    PT Time Calculation (min)  44 min    Equipment Utilized During Treatment  Gait belt    Activity Tolerance  Patient tolerated treatment well   L knee pain   Behavior During Therapy  [facility] for tasks assessed/performed       Past Medical History:  Diagnosis Date  . Arthritis   . BPH (benign prostatic hyperplasia)   . Chronic kidney disease    had a kidney stone which per family was a cyst that was removed  . Dysrhythmia   . GERD (gastroesophageal reflux disease)   . History of kidney stones   . Hyperlipidemia   . Hypertension   . Splenic vein thrombosis   . Stroke ([place]) 05/22/2019  . Thrombosis 12/2015   mural  area and no notation of heart attack    Past Surgical History:  Procedure Laterality Date  . APPENDECTOMY    . CATARACT EXTRACTION, BILATERAL    . CYSTOSCOPY WITH INSERTION OF UROLIFT    . KIDNEY STONE SURGERY    . KNEE SURGERY    . PACEMAKER INSERTION N/A 07/13/2018   Procedure: INSERTION PACEMAKER-DUEL CHAMBER INITIAL IMPLANT;  Surgeon: Isaias Cowman, MD;  Location: [facility] ORS;  Service: Cardiovascular;  Laterality: N/A;  . SPINE SURGERY      There were no vitals filed for this visit.  Subjective Assessment - 02/01/20 0756    Subjective  Patient reports no falls or LOB since last session. Reports no pain at this time, is ready to perform goals.    Pertinent History  Pt is a 86 year old male who presents with imbalance and difficulty with gait following a right frontal  CVA with left hemiparesis on 05/22/19.  He has completed inpatient and home health PT, and now presents for OP PT.  He was discharged from inpatient rehab at a supervision level of assistance, ambulating with a RW.  His family notes that he is impulsive and will try to ambulate without his RW at home.  Family is currently providing 24/7 supervision.    How long can you sit comfortably?  no limitations    How long can you walk comfortably?  he requires a RW at home    Patient Stated Goals  get back to normal; want to be able to play golf and go out to play trivia again.  His daughters want him to regain independence with going to the bathroom at night    Currently in Pain?  No/denies        [facility] PT Assessment - 01/31/20 0001      Standardized Balance Assessment   Standardized Balance Assessment  Berg Balance Test      Berg Balance Test   Sit to Stand  Able to stand without using hands and stabilize independently    Standing Unsupported  Able to stand safely 2 minutes    Sitting with Back Unsupported but Feet Supported on  Floor or Stool  Able to sit safely and securely 2 minutes    Stand to Sit  Sits safely with minimal use of hands    Transfers  Able to transfer safely, minor use of hands    Standing Unsupported with Eyes Closed  Able to stand 10 seconds with supervision    Standing Unsupported with Feet Together  Able to place feet together independently and stand for 1 minute with supervision    From Standing, Reach Forward with Outstretched Arm  Can reach forward >12 cm safely (5")    From Standing Position, Pick up Object from [place] to pick up shoe safely and easily    From Standing Position, Turn to Look Behind Over each Shoulder  Looks behind from both sides and weight shifts well    Turn 360 Degrees  Able to turn 360 degrees safely in 4 seconds or less    Standing Unsupported, Alternately Place Feet on Step/Stool  Able to complete >2 steps/needs minimal assist    Standing  Unsupported, One Foot in Front  Able to plae foot ahead of the other independently and hold 30 seconds    Standing on One Leg  Tries to lift leg/unable to hold 3 seconds but remains standing independently    Total Score  46      Goals:  BERG: 46/56 TUG: 14.97 with RW  10 MWT: with RW first trial 12.5 seconds, second one 10.56 = 0.94 m/s   Weave between 6 cones with focus on coordination/spatial awareness; 2x; cueing for keeping feet within walker   5lb ankle weights: standing requires CGA, seated cueing for task orientation and sequencing Standing hip flexion marching x15 each LE with RW  Standing hip abduction x15 each LE with RW  Standing hip flexion with straight leg  x15 each LE with RW Seated LAQ x12 each LE, max cueing for sequencing and proper body mechanics Seated HS curls with blue tband x 15 each LE;    Pt educated throughout session about proper posture and technique with exercises. Improved exercise technique, movement at target joints, use of target muscles after min to mod verbal, visual, tactile cues.                      PT Education - 02/01/20 0757    Education Details  goals, exercise technique, body mechanics    Person(s) Educated  Patient    Methods  Explanation;Demonstration;Tactile cues;Verbal cues    Comprehension  Verbalized understanding;Returned demonstration;Verbal cues required;Tactile cues required       PT Short Term Goals - 02/01/20 0747      PT SHORT TERM GOAL #1   Title  Pt will be independent with HEP in order to improve strength and balance in order to decrease fall risk and improve function at home and work.    Baseline  3/2 HEP compliant per patient report    Time  6    Period  Weeks    Status  On-going    Target Date  01/24/20        PT Long Term Goals - 02/01/20 0748      PT LONG TERM GOAL #1   Title  Pt will improve BERG to >50 in order to decrease his fall risk.    Baseline  09/21/19: 42/56, 11/03/19: 47/56;  12/13/19: 46/56 3/2: 46/56    Time  12    Period  Weeks    Status  On-going  Target Date  03/06/20      PT LONG TERM GOAL #2   Title  Pt will decrease TUG to below 14 seconds/decrease in order to demonstrate decreased fall risk.    Baseline  09/21/19: 22.19s; 11/03/19: 19.0s; 12/13/19: 17.0s 3/2: 14.97    Time  12    Period  Weeks    Status  Partially Met    Target Date  03/06/20      PT LONG TERM GOAL #3   Title  Pt will decrease 5TSTS by at least 3 seconds in order to demonstrate clinically significant improvement in LE strength.    Baseline  09/21/19: 32.28s; 11/03/19: 15.5s; 12/13/19: 14.2s    Time  12    Period  Weeks    Status  Achieved      PT LONG TERM GOAL #4   Title  Pt will increase his 10MWT to >1.0 m/s in order to demonstrate a clinically significant improvement in gait speed and improved community ambulation    Baseline  09/21/19: Self-selected: 20.9 s = 0.48 m/s; Fastest: 17.1s =0.58 m/s; 11/03/19: Self-selected: 17.7s s = 0.56 m/s; Fastest: 13.5s =0.74 m/s with walker; 12/13/19: Self-selected:  12.6s =  0.79 m/s; Fastest: 12.2 = 0.82 m/s with walker 3/2: 0.94 m/s with walker    Time  12    Period  Weeks    Status  Partially Met    Target Date  03/06/20            Plan - 02/01/20 0758    Clinical Impression Statement  Patient's goals performed this session with progression in mobility and velocity of ambulation however balance remains static at this time. Patient requires frequent cueing for task orientation and sequencing due to tangential nature however demonstrates excellent motivation with task performance once cued. Negotiation of obstacles requires skilled transversing with RW with occasional cueing for placement of feet for safety. He will continue to benefit from skilled PT services to address deficits in balance and decrease risk for future falls.    Personal Factors and Comorbidities  Age;Comorbidity 2    Comorbidities  HTN, CVA    Examination-Activity  Limitations  Bend;Lift;Squat;Locomotion Level;Stairs;Stand    Examination-Participation Restrictions  Community Activity;Driving    Stability/Clinical Decision Making  Evolving/Moderate complexity    Rehab Potential  Good    PT Frequency  2x / week    PT Duration  12 weeks    PT Treatment/Interventions  ADLs/Self Care Home Management;Canalith Repostioning;Cryotherapy;Electrical Stimulation;Iontophoresis 93m/ml Dexamethasone;Moist Heat;Traction;Ultrasound;DME Instruction;Gait training;Stair training;Therapeutic activities;Therapeutic exercise;Functional mobility training;Balance training;Neuromuscular re-education;Patient/family education;Manual techniques;Dry needling;Vestibular;Joint Manipulations;Spinal Manipulations    PT Next Visit Plan  Update outcome measures/goals, progress strength and balance as well as gait with spc    PT Home Exercise Plan  Access Code: [email or web address]   Consulted and Agree with Plan of Care  Patient;Family member/caregiver       Patient will benefit from skilled therapeutic intervention in order to improve the following deficits and impairments:  Abnormal gait, Decreased balance, Decreased endurance, Decreased mobility, Difficulty walking, Decreased knowledge of precautions, Decreased safety awareness, Decreased activity tolerance, Decreased strength, Hypermobility  Visit Diagnosis: Muscle weakness (generalized)  Unsteadiness on feet  Other abnormalities of gait and mobility     Problem List Patient Active Problem List   Diagnosis Date Noted  . Chronic venous insufficiency 12/05/2019  . 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 Great toe pain, right   . Lethargy   . CKD (chronic kidney disease),  stage II   . Leukocytosis   . Benign essential HTN   . New onset atrial fibrillation ([place])   . Dysphagia, post-stroke   . 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 Acute ischemic right MCA stroke ([place]) 05/25/2019  . Goals of care,  counseling/discussion   . Palliative care by specialist   . DNR (do not resuscitate) discussion   . Stroke, acute, embolic ([place]) 84/01/9794  . Persistent proteinuria 07/30/2018  . 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 Chronic kidney disease, stage III (moderate) 06/15/2018  . Leg pain 05/06/2018  . Lymphedema 05/06/2018  . Coagulopathy ([place]) 07/07/2017  . Lumbar spondylosis 03/10/2017  . Elevated uric acid in blood 11/20/2016  . BPH (benign prostatic hyperplasia) 05/29/2016  . Peripheral vascular disease of lower extremity ([place]) 03/03/2016  . Gallstone 12/31/2015  . 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 Atherosclerosis of aorta ([place]) 11/22/2015  . Carotid artery narrowing 11/22/2015  . Diverticulosis of colon 11/22/2015  . Decreased creatinine clearance 11/22/2015  . Arthritis, degenerative 11/22/2015  . Lactose intolerance 11/22/2015  . Basal cell carcinoma 11/22/2015  . Essential hypertension 06/21/2015  . Hyperlipemia 06/21/2015  . GERD (gastroesophageal reflux disease) 06/21/2015  . Calculus of kidney 11/18/2013   Janna Arch, PT, DPT   02/01/2020, 7:59 AM  [place] MAIN [facility] SERVICES [address] [place], [place], [address] Phone: [phone]   Fax:  [phone]  Name: HAYDEN KIHARA MRN: [id number] Date of Birth: 05/26/1926

## 2020-02-02 ENCOUNTER — Ambulatory Visit: Payer: Medicare Other

## 2020-02-02 ENCOUNTER — Other Ambulatory Visit: Payer: Self-pay

## 2020-02-02 DIAGNOSIS — M6281 Muscle weakness (generalized): Secondary | ICD-10-CM

## 2020-02-02 DIAGNOSIS — R2681 Unsteadiness on feet: Secondary | ICD-10-CM

## 2020-02-02 NOTE — Therapy (Signed)
[place] MAIN [facility] SERVICES [address] [place], [place], [address] Phone: [phone]   Fax:  [phone]  Physical Therapy Treatment  Patient Details  Name: Jose Castro MRN: [id number] Date of Birth: 18-Sep-1926 Referring Provider (PT): Dr. Letta Castro   Encounter Date: 02/02/2020  PT End of Session - 02/02/20 1501    Visit Number  32    Number of Visits  49    Date for PT Re-Evaluation  03/06/20    PT Start Time  1100    PT Stop Time  1145    PT Time Calculation (min)  45 min    Equipment Utilized During Treatment  Gait belt    Activity Tolerance  Patient tolerated treatment well   L knee pain   Behavior During Therapy  [facility] for tasks assessed/performed       Past Medical History:  Diagnosis Date  . Arthritis   . BPH (benign prostatic hyperplasia)   . Chronic kidney disease    had a kidney stone which per family was a cyst that was removed  . Dysrhythmia   . GERD (gastroesophageal reflux disease)   . History of kidney stones   . Hyperlipidemia   . Hypertension   . Splenic vein thrombosis   . Stroke (Jose Castro) 05/22/2019  . Thrombosis 12/2015   mural  area and no notation of heart attack    Past Surgical History:  Procedure Laterality Date  . APPENDECTOMY    . CATARACT EXTRACTION, BILATERAL    . CYSTOSCOPY WITH INSERTION OF UROLIFT    . KIDNEY STONE SURGERY    . KNEE SURGERY    . PACEMAKER INSERTION N/A 07/13/2018   Procedure: INSERTION PACEMAKER-DUEL CHAMBER INITIAL IMPLANT;  Surgeon: Isaias Cowman, MD;  Location: [facility] ORS;  Service: Cardiovascular;  Laterality: N/A;  . SPINE SURGERY      There were no vitals filed for this visit.  Subjective Assessment - 02/02/20 1500    Subjective  Patient reports no falls or LOB since last session. No resting pain however continues with chronic L knee pain with activity.    Pertinent History  Pt is a 86 year old male who presents with imbalance and difficulty with gait following  a right frontal CVA with left hemiparesis on 05/22/19.  He has completed inpatient and home health PT, and now presents for OP PT.  He was discharged from inpatient rehab at a supervision level of assistance, ambulating with a RW.  His family notes that he is impulsive and will try to ambulate without his RW at home.  Family is currently providing 24/7 supervision.    How long can you sit comfortably?  no limitations    How long can you walk comfortably?  he requires a RW at home    Patient Stated Goals  get back to normal; want to be able to play golf and go out to play trivia again.  His daughters want him to regain independence with going to the bathroom at night    Currently in Pain?  No/denies         TREATMENT   Neuromuscular Re-education Dynamic balance during ambulation withsingle point caneapproximately400'today. Ptperformedhorizontal and vertical head turns with cues from therapist as well as gait speed changes.Practice opening/closing multiple doors and education provided about switching hands with cane in order to safely open doors without losing balance; 6" alternating forward and lateral step taps without UE support x 10 each both directions; AirexNBOS dart board throws x  5 minutes to challenge dynamic balance;   Therapeutic Exercise NuStep L2-4 x 5 minutes for warm-up (unbilled); Seated hip flexion marching with manual resistance x 20 bilateral; Seated clams with manual resistance x 20 bilateral; Seated adductor squeeze with manual resistance x 20 bilateral; Seated R LAQ withmanual resistance x 20; Seated HS curls with manual resistance x 12 bilateral; Seated heel raises with manual resistance x 20 bilateral; Seated adductor squeeze withball between knees 3s hold x 20;   Pt educated throughout session about proper posture and technique with exercises. Improved exercise technique, movement at target joints, use of target muscles after min to mod verbal,  visual, tactile cues.   Practiced gait again with patient today and worked on opening/closing doors while using a cane. Also performed dynamic balance with patient on Airex pad during dart board toss. Continued with seated strengthening to minimize discomfort in standing with L knee. Pt encouraged to continue his HEP. Hewillcontinue tobenefit from skilled PT services to address deficits in balance and decrease risk for future falls.                        PT Short Term Goals - 02/01/20 0747      PT SHORT TERM GOAL #1   Title  Pt will be independent with HEP in order to improve strength and balance in order to decrease fall risk and improve function at home and work.    Baseline  3/2 HEP compliant per patient report    Time  6    Period  Weeks    Status  On-going    Target Date  01/24/20        PT Long Term Goals - 02/01/20 0748      PT LONG TERM GOAL #1   Title  Pt will improve BERG to >50 in order to decrease his fall risk.    Baseline  09/21/19: 42/56, 11/03/19: 47/56; 12/13/19: 46/56 3/2: 46/56    Time  12    Period  Weeks    Status  On-going    Target Date  03/06/20      PT LONG TERM GOAL #2   Title  Pt will decrease TUG to below 14 seconds/decrease in order to demonstrate decreased fall risk.    Baseline  09/21/19: 22.19s; 11/03/19: 19.0s; 12/13/19: 17.0s 3/2: 14.97    Time  12    Period  Weeks    Status  Partially Met    Target Date  03/06/20      PT LONG TERM GOAL #3   Title  Pt will decrease 5TSTS by at least 3 seconds in order to demonstrate clinically significant improvement in LE strength.    Baseline  09/21/19: 32.28s; 11/03/19: 15.5s; 12/13/19: 14.2s    Time  12    Period  Weeks    Status  Achieved      PT LONG TERM GOAL #4   Title  Pt will increase his 10MWT to >1.0 m/s in order to demonstrate a clinically significant improvement in gait speed and improved community ambulation    Baseline  09/21/19: Self-selected: 20.9 s = 0.48 m/s;  Fastest: 17.1s =0.58 m/s; 11/03/19: Self-selected: 17.7s s = 0.56 m/s; Fastest: 13.5s =0.74 m/s with walker; 12/13/19: Self-selected:  12.6s =  0.79 m/s; Fastest: 12.2 = 0.82 m/s with walker 3/2: 0.94 m/s with walker    Time  12    Period  Weeks    Status  Partially Met  Target Date  03/06/20            Plan - 02/02/20 1501    Clinical Impression Statement  Practiced gait again with patient today and worked on opening/closing doors while using a cane. Also performed dynamic balance with patient on Airex pad during dart board toss. Continued with seated strengthening to minimize discomfort in standing with L knee. Pt encouraged to continue his HEP. He will continue to benefit from skilled PT services to address deficits in balance and decrease risk for future falls.    Personal Factors and Comorbidities  Age;Comorbidity 2    Comorbidities  HTN, CVA    Examination-Activity Limitations  Bend;Lift;Squat;Locomotion Level;Stairs;Stand    Examination-Participation Restrictions  Community Activity;Driving    Stability/Clinical Decision Making  Evolving/Moderate complexity    Rehab Potential  Good    PT Frequency  2x / week    PT Duration  12 weeks    PT Treatment/Interventions  ADLs/Self Care Home Management;Canalith Repostioning;Cryotherapy;Electrical Stimulation;Iontophoresis 46m/ml Dexamethasone;Moist Heat;Traction;Ultrasound;DME Instruction;Gait training;Stair training;Therapeutic activities;Therapeutic exercise;Functional mobility training;Balance training;Neuromuscular re-education;Patient/family education;Manual techniques;Dry needling;Vestibular;Joint Manipulations;Spinal Manipulations    PT Next Visit Plan  Update outcome measures/goals, progress strength and balance as well as gait with spc    PT Home Exercise Plan  Access Code: [email or web address]   Consulted and Agree with Plan of Care  Patient;Family member/caregiver       Patient will benefit from skilled therapeutic intervention in order  to improve the following deficits and impairments:  Abnormal gait, Decreased balance, Decreased endurance, Decreased mobility, Difficulty walking, Decreased knowledge of precautions, Decreased safety awareness, Decreased activity tolerance, Decreased strength, Hypermobility  Visit Diagnosis: Muscle weakness (generalized)  Unsteadiness on feet     Problem List Patient Active Problem List   Diagnosis Date Noted  . Chronic venous insufficiency 12/05/2019  . 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 Great toe pain, right   . Lethargy   . CKD (chronic kidney disease), stage II   . Leukocytosis   . Benign essential HTN   . New onset atrial fibrillation ([place]   . Dysphagia, post-stroke   . Right middle cerebral artery stroke ([place] 05/27/2019  . Acute ischemic right MCA stroke ([place] 05/25/2019  . Goals of care, counseling/discussion   . Palliative care by specialist   . DNR (do not resuscitate) discussion   . Stroke, acute, embolic ([place] 016/60/6301 . Persistent proteinuria 07/30/2018  . 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 Chronic kidney disease, stage III (moderate) 06/15/2018  . Leg pain 05/06/2018  . Lymphedema 05/06/2018  . Coagulopathy ([place] 07/07/2017  . Lumbar spondylosis 03/10/2017  . Elevated uric acid in blood 11/20/2016  . BPH (benign prostatic hyperplasia) 05/29/2016  . Peripheral vascular disease of lower extremity ([place] 03/03/2016  . Gallstone 12/31/2015  . 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 Atherosclerosis of aorta ([place] 11/22/2015  . Carotid artery narrowing 11/22/2015  . Diverticulosis of colon 11/22/2015  . Decreased creatinine clearance 11/22/2015  . Arthritis, degenerative 11/22/2015  . Lactose intolerance 11/22/2015  . Basal cell carcinoma 11/22/2015  . Essential hypertension 06/21/2015  . Hyperlipemia 06/21/2015  . GERD (gastroesophageal reflux disease) 06/21/2015  . Calculus of kidney  11/18/2013   JPhillips GroutPT, DPT, GCS  Kenn Rekowski 02/02/2020, 3:16 PM  [place] [facility] [address] [place] [address]Phone: [phone]  Fax:  [phone] Name: Jose Castro  MRN: [id number] Date of Birth: 09/10/1926

## 2020-02-03 NOTE — Addendum Note (Signed)
Addended by: Clemetine Marker D on: 02/03/2020 09:19 AM   Modules accepted: Level of Service

## 2020-02-03 NOTE — Therapy (Signed)
[place] MAIN [facility] SERVICES [address] [place], [place], [address] Phone: [phone]   Fax:  [phone]  Occupational Therapy Treatment  Patient Details  Name: Jose Castro MRN: [id number] Date of Birth: 1926-06-05 Referring Provider (OT): Steele Sizer   Encounter Date: 01/31/2020  OT End of Session - 02/03/20 0826    Visit Number  24    Number of Visits  26    Date for OT Re-Evaluation  01/27/20    Authorization Type  progress report from 12/13/2019    OT Start Time  1345    OT Stop Time  1431    OT Time Calculation (min)  46 min    Activity Tolerance  Patient tolerated treatment well    Behavior During Therapy  [facility] for tasks assessed/performed       Past Medical History:  Diagnosis Date  . Arthritis   . BPH (benign prostatic hyperplasia)   . Chronic kidney disease    had a kidney stone which per family was a cyst that was removed  . Dysrhythmia   . GERD (gastroesophageal reflux disease)   . History of kidney stones   . Hyperlipidemia   . Hypertension   . Splenic vein thrombosis   . Stroke ([place]) 05/22/2019  . Thrombosis 12/2015   mural  area and no notation of heart attack    Past Surgical History:  Procedure Laterality Date  . APPENDECTOMY    . CATARACT EXTRACTION, BILATERAL    . CYSTOSCOPY WITH INSERTION OF UROLIFT    . KIDNEY STONE SURGERY    . KNEE SURGERY    . PACEMAKER INSERTION N/A 07/13/2018   Procedure: INSERTION PACEMAKER-DUEL CHAMBER INITIAL IMPLANT;  Surgeon: Isaias Cowman, MD;  Location: [facility] ORS;  Service: Cardiovascular;  Laterality: N/A;  . SPINE SURGERY      There were no vitals filed for this visit.  Subjective Assessment - 02/03/20 0823    Subjective   Patient reports he is doing well, has met his goals.  His children still come daily and remain a part of his routine but he reports he is performing all self care tasks, homemaking tasks and is independent.  He has not returned to driving  and occasionally requires assist with changing the sheets.  Family does his laundry and continue to plan to do so.    Pertinent History  Pt. is a 86 y.o. male who was admitted to [facility] with a [facility] DIstribution Infarct on 05/21/2019. Pt. went through Inpatient Rehab, and had home health OT, PT, and ST services. Pt. was previously residing home alone, and was independent with ADLs, and IADLs. Pt. now has family staying with the pt. around the clock.    Patient Stated Goals  to regain functional independence safely    Currently in Pain?  No/denies    Pain Score  0-No pain    Multiple Pain Sites  No        [facility] OT Assessment - 02/03/20 0828      Assessment   Medical Diagnosis  CVA, abnormalities in gait, unsteadiness    Referring Provider (OT)  Ancil Boozer, Drue Stager    Onset Date/Surgical Date  05/21/19    Hand Dominance  Right      Precautions   Precautions  Fall      ADL   Eating/Feeding  Independent    Grooming  Independent    Upper Body Bathing  Independent    Lower Body Bathing  Independent    Upper Body Dressing  Independent    Lower Body Dressing  Independent    Banker -  Hygiene  Independent    Tub/Shower Transfer  Modified independent      IADL   Prior Level of Function Shopping  independent    Shopping  Needs to be accompanied on any shopping trip    Prior Level of Function Light Housekeeping  independent    Light Housekeeping  All laundry must be done by others    Prior Level of Function Meal Prep  independent    Meal Prep  Plans, prepares and serves adequate meals independently    Prior Level of Function Community Mobility  independent    Community Mobility  Relies on family or friends for transportation    Prior Level of Function Medication Managment  independent    Medication Management  Takes responsibility if medication is prepared in advance in seperate dosage    Prior Level of Function Financial Management  independent     Financial Management  Manages day-to-day purchases, but needs help with banking, major purchases, etc.;Requires supervision/minimal cuing      Mobility   Mobility Status  Needs assist    Mobility Status Comments  uses rolling walker      Written Expression   Handwriting  90% legible      Vision - History   Baseline Vision  Bifocals      Cognition   Overall Cognitive Status  Within Functional Limits for tasks assessed      Sensation   Light Touch  Appears Intact    Stereognosis  Appears Intact    Hot/Cold  Appears Intact    Proprioception  Appears Intact      Coordination   Right 9 Hole Peg Test  26    Left 9 Hole Peg Test  35      Hand Function   Right Hand Grip (lbs)  60    Right Hand Lateral Pinch  16 lbs    Right Hand 3 Point Pinch  16 lbs    Left Hand Grip (lbs)  50    Left Hand Lateral Pinch  17 lbs    Left 3 point pinch  15 lbs       Reassessment of skills, goals and plan of care. ADLs:  Patient is currently independent with self care tasks, light homemaking and cooking skills.  His family assists with transportation, laundry and occasional help with changing the bed sheets.    Reassessment of grip, pinch and fine motor coordination see above.  Patient instructed on home exercise program for fine motor coordination skills and grip strength.  Patient able to demonstrate understanding.    Goals updated to reflect progress and patient has met all goals.    Patient seen for resistive pinch skills with resistive pinch pins placed in elevated plane of motion to encourage increased ROM and reach.  Patient able to complete all levels of resistance this date.   Manipulation of [place] discs for turning, flipping  And working towards improved speed and dexterity skills. Patient performing well and able to demo isolated finger movements.     Response to tx: Patient seen for discharge visit, has met all goals and is able to demonstrate understanding and independence  with home program.  Patient is independent with basic self care tasks, light homemaking and has a supportive family at home who assists with any needs such  as transportation and laundry since it is located downstairs.  Family indicates they will continue to provide assist in these areas.  Will discharge patient at this time with goals met, please reconsult if any additional needs arise.                 OT Education - 02/03/20 0825    Education Details  reviewed measurements, goals and HEP    Person(s) Educated  Patient    Methods  Explanation;Demonstration    Comprehension  Verbalized understanding;Returned demonstration;Verbal cues required          OT Long Term Goals - 02/03/20 0827      OT LONG TERM GOAL #1   Title  Pt. will demonstrate home management tasks with supervision initating safe strategies    Baseline  Eval: Decreased safety awareness, 12/13/19 min assist with home management tasks    Time  12    Period  Weeks    Status  Achieved      OT LONG TERM GOAL #2   Title  Pt. will be able to identify 100% of potential safety hazards in a simulated home environment during ADLs, and IADLs.    Baseline  Eval: Limited safety awareness    Time  12    Period  Weeks    Status  Achieved      OT LONG TERM GOAL #3   Title  Pt. will increase left grip strength by 5# of force to assist with holding items duirng ADLs, and IADLs.    Baseline  Eval: decreased left grip strength, 12/13/19 increased by 4#    Time  12    Period  Weeks    Status  Achieved      OT LONG TERM GOAL #4   Title  Pt. will improve left hand [facility] skills by 3 sec of speed to be able to manipulate objects during ADLs, and IADL tasks.    Baseline  improved by 2 secs 12/13/19    Time  12    Period  Weeks    Status  Achieved      OT LONG TERM GOAL #5   Title  Pt. will demonstrate visual compensatory strategies as needed during ADLs, and IADL tasks.    Baseline  Eval: Limited left sided awareness, 12/13/19  improved but continues to require some min cues with IADL tasks    Time  12    Period  Weeks    Status  Achieved      OT LONG TERM GOAL #6   Title  Pt. will demonstrate cognitive compensatory strategies during ADLs, and IADLs    Time  12    Period  Weeks    Status  Achieved            Plan - 02/03/20 0827    Clinical Impression Statement  Patient seen for discharge visit, has met all goals and is able to demonstrate understanding and independence with home program.  Patient is independent with basic self care tasks, light homemaking and has a supportive family at home who assists with any needs such as transportation and laundry since it is located downstairs.  Family indicates they will continue to provide assist in these areas.  Will discharge patient at this time with goals met, please reconsult if any additional needs arise.    OT Occupational Profile and History  Detailed Assessment- Review of Records and additional review of physical, cognitive, psychosocial history related to current functional performance  Occupational performance deficits (Please refer to evaluation for details):  ADL's;IADL's    Body Structure / Function / Physical Skills  ADL;FMC;ROM;IADL;Strength;Decreased knowledge of use of DME;UE functional use    Rehab Potential  Excellent    Clinical Decision Making  Several treatment options, min-mod task modification necessary    Comorbidities Affecting Occupational Performance:  May have comorbidities impacting occupational performance    Modification or Assistance to Complete Evaluation   Min-Moderate modification of tasks or assist with assess necessary to complete eval    OT Frequency  1x / week    OT Duration  6 weeks    OT Treatment/Interventions  Self-care/ADL training;DME and/or AE instruction;Therapeutic exercise;Neuromuscular education;Patient/family education;Therapeutic activities    Consulted and Agree with Plan of Care  Patient       Patient will  benefit from skilled therapeutic intervention in order to improve the following deficits and impairments:   Body Structure / Function / Physical Skills: ADL, FMC, ROM, IADL, Strength, Decreased knowledge of use of DME, UE functional use       Visit Diagnosis: Muscle weakness (generalized)  Other lack of coordination    Problem List Patient Active Problem List   Diagnosis Date Noted  . Chronic venous insufficiency 12/05/2019  . 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 Great toe pain, right   . Lethargy   . CKD (chronic kidney disease), stage II   . Leukocytosis   . Benign essential HTN   . New onset atrial fibrillation ([place])   . Dysphagia, post-stroke   . Right middle cerebral artery stroke ([place]) 05/27/2019  . Acute ischemic right MCA stroke ([place]) 05/25/2019  . Goals of care, counseling/discussion   . Palliative care by specialist   . DNR (do not resuscitate) discussion   . Stroke, acute, embolic ([place]) 95/08/3266  . Persistent proteinuria 07/30/2018  . 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 Chronic kidney disease, stage III (moderate) 06/15/2018  . Leg pain 05/06/2018  . Lymphedema 05/06/2018  . Coagulopathy ([place]) 07/07/2017  . Lumbar spondylosis 03/10/2017  . Elevated uric acid in blood 11/20/2016  . BPH (benign prostatic hyperplasia) 05/29/2016  . Peripheral vascular disease of lower extremity ([place]) 03/03/2016  . Gallstone 12/31/2015  . 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 Atherosclerosis of aorta ([place]) 11/22/2015  . Carotid artery narrowing 11/22/2015  . Diverticulosis of colon 11/22/2015  . Decreased creatinine clearance 11/22/2015  . Arthritis, degenerative 11/22/2015  . Lactose intolerance 11/22/2015  . Basal cell carcinoma 11/22/2015  . Essential hypertension 06/21/2015  . Hyperlipemia 06/21/2015  . GERD (gastroesophageal reflux disease) 06/21/2015  . Calculus of kidney 11/18/2013   Amy T Tomasita Morrow,  OTR/L, CLT  Lovett,Amy 02/03/2020, 8:45 AM  [place] MAIN [facility] SERVICES [address] [place], [place], [address] Phone: [phone]   Fax:  [phone]  Name: Jose Castro MRN: [id number] Date of Birth: 07-04-1926

## 2020-02-07 ENCOUNTER — Other Ambulatory Visit: Payer: Self-pay

## 2020-02-07 ENCOUNTER — Ambulatory Visit (INDEPENDENT_AMBULATORY_CARE_PROVIDER_SITE_OTHER): Payer: Medicare Other

## 2020-02-07 VITALS — BP 102/70 | HR 76 | Temp 96.9°F | Resp 15 | Ht 71.0 in | Wt 163.6 lb

## 2020-02-07 DIAGNOSIS — Z0000 Encounter for general adult medical examination without abnormal findings: Secondary | ICD-10-CM

## 2020-02-07 NOTE — Progress Notes (Addendum)
Subjective:   Jose Castro is a 86 y.o. male who presents for Medicare Annual/Subsequent preventive examination.  Review of Systems:   Cardiac Risk Factors include: advanced age (>75men, >63 women);dyslipidemia;male gender;hypertension     Objective:    Vitals: BP 102/70 (BP Location: Left Arm, Patient Position: Sitting, Cuff Size: Normal)   Pulse 76   Temp (!) 96.9 F (36.1 C) (Temporal)   Resp 15   Ht 5\' 11"  (1.803 m)   Wt 163 lb 9.6 oz (74.2 kg)   BMI 22.82 kg/m   Body mass index is 22.82 kg/m.  Advanced Directives 02/07/2020 05/27/2019 05/22/2019 05/21/2019 02/01/2019 07/17/2018 07/13/2018  Does Patient Have a Medical Advance Directive? Yes No No No No No No  Type of Paramedic of [place];Living will - - - - - -  Does patient want to make changes to medical advance directive? - - - - - No - Patient declined No - Patient declined  Copy of [place] in Chart? No - copy requested - - - - - -  Would patient like information on creating a medical advance directive? - No - Patient declined No - Patient declined - Yes (MAU/Ambulatory/Procedural Areas - Information given) - No - Patient declined    Tobacco Social History   Tobacco Use  Smoking Status Former Smoker  . Packs/day: 0.00  . Years: 16.00  . Pack years: 0.00  . Types: Cigarettes  . Start date: 10/21/1944  . Quit date: 10/21/1960  . Years since quitting: 59.3  Smokeless Tobacco Never Used     Counseling given: Not Answered   Clinical Intake:  Pre-visit preparation completed: Yes  Pain : No/denies pain Pain Score: 0-No pain     BMI - recorded: 22.82 Nutritional Status: BMI of 19-24  Normal Nutritional Risks: None Diabetes: No  How often do you need to have someone help you when you read instructions, pamphlets, or other written materials from your doctor or pharmacy?: 1 - Never  Interpreter Needed?: No  Information entered by :: Clemetine Marker LPN  Past Medical  History:  Diagnosis Date  . Allergy    Penicillin  . Arthritis   . Basal cell carcinoma   . BPH (benign prostatic hyperplasia)   . Chronic kidney disease    had a kidney stone which per family was a cyst that was removed  . Dysrhythmia   . GERD (gastroesophageal reflux disease)   . History of kidney stones   . Hyperlipidemia   . Hypertension   . Left-sided low back pain with left-sided sciatica   . Splenic vein thrombosis   . Stroke ([place]) 05/22/2019  . Thrombosis 12/2015   mural  area and no notation of heart attack   Past Surgical History:  Procedure Laterality Date  . APPENDECTOMY    . CATARACT EXTRACTION, BILATERAL    . CYSTOSCOPY WITH INSERTION OF UROLIFT    . EYE SURGERY  08/2018  . KIDNEY STONE SURGERY    . KNEE SURGERY    . PACEMAKER INSERTION N/A 07/13/2018   Procedure: INSERTION PACEMAKER-DUEL CHAMBER INITIAL IMPLANT;  Surgeon: Isaias Cowman, MD;  Location: [facility] ORS;  Service: Cardiovascular;  Laterality: N/A;  . SPINE SURGERY     Family History  Problem Relation Age of Onset  . Cancer Brother        bladder cancer with mets  . Heart disease Mother   . Aortic aneurysm Mother   . Heart attack Maternal Aunt   .  Heart attack Maternal Uncle    Social History   Socioeconomic History  . Marital status: Widowed    Spouse name: Not on file  . Number of children: 6  . Years of education: Not on file  . Highest education level: Some college, no degree  Occupational History  . 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 Smoking status: Former Smoker    Packs/day: 0.00    Years: 16.00    Pack years: 0.00    Types: Cigarettes    Start date: 10/21/1944    Quit date: 10/21/1960    Years since quitting: 59.3  . Smokeless tobacco: Never Used  Substance and Sexual Activity  . Alcohol use: Yes    Alcohol/week: 3.0 standard drinks    Types: 3 Standard drinks or equivalent per week    Comment: once a week when he goes  out to dinner-socially  . Drug use: No  . Sexual activity: Yes    Partners: Female  Other Topics Concern  . Not on file  Social History Narrative   Widow, has a good networks of friends and family    Social Determinants of Health   Financial Resource Strain: Low Risk   . Difficulty of Paying Living Expenses: Not hard at all  Food Insecurity: No Food Insecurity  . Worried About Charity fundraiser in the Last Year: Never true  . Ran Out of Food in the Last Year: Never true  Transportation Needs: No Transportation Needs  . Lack of Transportation (Medical): No  . Lack of Transportation (Non-Medical): No  Physical Activity: Inactive  . Days of Exercise per Week: 0 days  . Minutes of Exercise per Session: 0 min  Stress: No Stress Concern Present  . Feeling of Stress : Not at all  Social Connections: Slightly Isolated  . Frequency of Communication with Friends and Family: More than three times a week  . Frequency of Social Gatherings with Friends and Family: Twice a week  . Attends Religious Services: More than 4 times per year  . Active Member of Clubs or Organizations: Yes  . Attends Archivist Meetings: More than 4 times per year  . Marital Status: Widowed    Outpatient Encounter Medications as of 02/07/2020  Medication Sig  . acetaminophen (TYLENOL) 325 MG tablet Take 2 tablets (650 mg total) by mouth every 4 (four) hours as needed for mild pain (or temp > 37.5 C (99.5 F)). (Patient taking differently: Take 650 mg by mouth as needed for mild pain (or temp > 37.5 C (99.5 F)). )  . apixaban (ELIQUIS) 2.5 MG TABS tablet Take 1 tablet (2.5 mg total) by mouth 2 (two) times daily.  . Cholecalciferol (VITAMIN D3) 50 MCG (2000 UT) TABS Take 2,000 Units by mouth daily.  . finasteride (PROSCAR) 5 MG tablet Take 1 tablet (5 mg total) by mouth daily.  . metoprolol succinate (TOPROL XL) 25 MG 24 hr tablet Take 1 tablet (25 mg total) by mouth daily.  . Omega-3 Fatty Acids (FISH OIL)  1000 MG CAPS Take 1,000 mg by mouth daily.   . pantoprazole (PROTONIX) 40 MG tablet Take 1 tablet (40 mg total) by mouth daily.  . rosuvastatin (CRESTOR) 40 MG tablet Take 1 tablet (40 mg total) by mouth daily at 6 PM.   No facility-administered encounter medications on file as of 02/07/2020.    Activities of Daily Living In your  present state of health, do you have any difficulty performing the following activities: 02/07/2020  Hearing? Y  Comment has hearing aids  Vision? N  Difficulty concentrating or making decisions? N  Walking or climbing stairs? Y  Dressing or bathing? Y  Doing errands, shopping? Y  Preparing Food and eating ? N  Using the Toilet? N  In the past six months, have you accidently leaked urine? N  Comment wears depends for protection  Do you have problems with loss of bowel control? N  Managing your Medications? Y  Managing your Finances? N  Housekeeping or managing your Housekeeping? Y  Some recent data might be hidden    Patient Care Team: Steele Sizer, MD as PCP - General (Family Medicine) Royston Cowper, MD as Consulting Physician (Urology) Yolonda Kida, MD as Consulting Physician (Cardiology) Cammie Sickle, MD as Consulting Physician (Internal Medicine) Delana Meyer, Dolores Lory, MD as Consulting Physician (Vascular Surgery) Ralene Bathe, MD as Consulting Physician (Dermatology)   Assessment:   This is a routine wellness examination for [place].  Exercise Activities and Dietary recommendations Current Exercise Habits: Home exercise routine, Type of exercise: stretching, Time (Minutes): 10, Frequency (Times/Week): 7, Weekly Exercise (Minutes/Week): 70, Intensity: Mild, Exercise limited by: neurologic condition(s);orthopedic condition(s)  Goals    . DIET - INCREASE WATER INTAKE     Recommend drinking 6-8 glasses of water per day.        Fall Risk Fall Risk  02/07/2020 01/17/2020 11/08/2019 07/28/2019 07/12/2019  Falls in the past year? 0 0  0 0 0  Number falls in past yr: 0 - - - -  Injury with Fall? 0 - - - -  Risk for fall due to : Impaired balance/gait;Impaired mobility - - - -  Follow up Falls prevention discussed - - - -   FALL RISK PREVENTION PERTAINING TO THE HOME:  Any stairs in or around the home? Yes  If so, do they handrails? Yes   Home free of loose throw rugs in walkways, pet beds, electrical cords, etc? Yes  Adequate lighting in your home to reduce risk of falls? Yes   ASSISTIVE DEVICES UTILIZED TO PREVENT FALLS:  Life alert? No  Use of a cane, walker or w/c? Yes  Grab bars in the bathroom? Yes  Shower chair or bench in shower? Yes  Elevated toilet seat or a handicapped toilet? Yes   DME ORDERS:  DME order needed?  No   TIMED UP AND GO:  Was the test performed? Yes .  Length of time to ambulate 10 feet: 7 sec.   GAIT:  Appearance of gait: Gait slow, steady and with the use of an assistive device.   Education: Fall risk prevention has been discussed.  Intervention(s) required? No    Depression Screen PHQ 2/9 Scores 02/07/2020 06/22/2019 02/01/2019 07/30/2018  PHQ - 2 Score 0 0 0 0  PHQ- 9 Score - 1 - -    Cognitive Function 6CIT deferred for 2021 AWV due to pt having a hard time hearing. Daughter states no issues or concerns regarding memory.      6CIT Screen 02/01/2019  What Year? 0 points  What month? 0 points  What time? 0 points  Count back from 20 0 points  Months in reverse 0 points  Repeat phrase 0 points  Total Score 0    Immunization History  Administered Date(s) Administered  . Moderna SARS-COVID-2 Vaccination 01/23/2020  . Pneumococcal Conjugate-13 02/06/2014  . Pneumococcal Polysaccharide-23 06/08/2012  .  Tdap 06/08/2012    Qualifies for Shingles Vaccine? Yes . Due for Shingrix. Education has been provided regarding the importance of this vaccine. Pt has been advised to call insurance company to determine out of pocket expense. Advised may also receive vaccine at local  pharmacy or Health Dept. Verbalized acceptance and understanding.  Tdap: Up to date  Flu Vaccine: Due for Flu vaccine. Does the patient want to receive this vaccine today?  No . Education has been provided regarding the importance of this vaccine but still declined. Advised may receive this vaccine at local pharmacy or Health Dept. Aware to provide a copy of the vaccination record if obtained from local pharmacy or Health Dept. Verbalized acceptance and understanding.  Pneumococcal Vaccine: Up to date   Screening Tests Health Maintenance  Topic Date Due  . INFLUENZA VACCINE  02/29/2020 (Originally 07/02/2019)  . TETANUS/TDAP  06/08/2022  . PNA vac Low Risk Adult  Completed   Cancer Screenings:  Colorectal Screening:  No longer required.   Lung Cancer Screening: (Low Dose CT Chest recommended if Age 73-80 years, 30 pack-year currently smoking OR have quit w/in 15years.) does not qualify.   Additional Screening:  Hepatitis C Screening: no longer required  Vision Screening: Recommended annual ophthalmology exams for early detection of glaucoma and other disorders of the eye. Is the patient up to date with their annual eye exam?  Yes  Who is the provider or what is the name of the office in which the pt attends annual eye exams? [facility]  Dental Screening: Recommended annual dental exams for proper oral hygiene  Community Resource Referral:  CRR required this visit?  No       Plan:    I have personally reviewed and addressed the Medicare Annual Wellness questionnaire and have noted the following in the patient's chart:  A. Medical and social history B. Use of alcohol, tobacco or illicit drugs  C. Current medications and supplements D. Functional ability and status E.  Nutritional status F.  Physical activity G. Advance directives H. List of other physicians I.  Hospitalizations, surgeries, and ER visits in previous 12 months J.  [place] such as hearing and  vision if needed, cognitive and depression L. Referrals and appointments   In addition, I have reviewed and discussed with patient certain preventive protocols, quality metrics, and best practice recommendations. A written personalized care plan for preventive services as well as general preventive health recommendations were provided to patient.   Signed,  Clemetine Marker, LPN Nurse Health Advisor   Nurse Notes: pt accompanied to visit by daughter Ames Dura. Advised due for office visit with Dr. Ancil Boozer as patient has not been seen in the past year by PCP. Pt has been seen by cardiology and neurology but will be due for rx refills coming up since last rx's written by hospital MD at discharge s/p stroke in June 2020.

## 2020-02-07 NOTE — Patient Instructions (Signed)
[name] , Thank you for taking time to come for your Medicare Wellness Visit. I appreciate your ongoing commitment to your health goals. Please review the following plan we discussed and let me know if I can assist you in the future.   Screening recommendations/referrals: Colonoscopy: no longer required Recommended yearly ophthalmology/optometry visit for glaucoma screening and checkup Recommended yearly dental visit for hygiene and checkup  Vaccinations: Influenza vaccine: postponed Pneumococcal vaccine: done 02/06/14 Tdap vaccine: done 06/08/12 Shingles vaccine: Shingrix discussed. Please contact your pharmacy for coverage information.  Covid-19: 1st dose 01/23/20  Advanced directives: Please bring a copy of your health care power of attorney and living will to the office at your convenience.  Conditions/risks identified: Recommend drinking 6-8 glasses of water per day  Next appointment: Please follow up in one year for your Medicare Annual Wellness visit.    Preventive Care 84 Years and Older, Male Preventive care refers to lifestyle choices and visits with your health care provider that can promote health and wellness. What does preventive care include?  A yearly physical exam. This is also called an annual well check.  Dental exams once or twice a year.  Routine eye exams. Ask your health care provider how often you should have your eyes checked.  Personal lifestyle choices, including:  Daily care of your teeth and gums.  Regular physical activity.  Eating a healthy diet.  Avoiding tobacco and drug use.  Limiting alcohol use.  Practicing safe sex.  Taking low doses of aspirin every day.  Taking vitamin and mineral supplements as recommended by your health care provider. What happens during an annual well check? The services and screenings done by your health care provider during your annual well check will depend on your age, overall health, lifestyle risk factors,  and family history of disease. Counseling  Your health care provider may ask you questions about your:  Alcohol use.  Tobacco use.  Drug use.  Emotional well-being.  Home and relationship well-being.  Sexual activity.  Eating habits.  History of falls.  Memory and ability to understand (cognition).  Work and work Statistician. Screening  You may have the following tests or measurements:  Height, weight, and BMI.  Blood pressure.  Lipid and cholesterol levels. These may be checked every 5 years, or more frequently if you are over 90 years old.  Skin check.  Lung cancer screening. You may have this screening every year starting at age 37 if you have a 30-pack-year history of smoking and currently smoke or have quit within the past 15 years.  Fecal occult blood test (FOBT) of the stool. You may have this test every year starting at age 34.  Flexible sigmoidoscopy or colonoscopy. You may have a sigmoidoscopy every 5 years or a colonoscopy every 10 years starting at age 52.  Prostate cancer screening. Recommendations will vary depending on your family history and other risks.  Hepatitis C blood test.  Hepatitis B blood test.  Sexually transmitted disease (STD) testing.  Diabetes screening. This is done by checking your blood sugar (glucose) after you have not eaten for a while (fasting). You may have this done every 1-3 years.  Abdominal aortic aneurysm (AAA) screening. You may need this if you are a current or former smoker.  Osteoporosis. You may be screened starting at age 56 if you are at high risk. Talk with your health care provider about your test results, treatment options, and if necessary, the need for more tests. Vaccines  Your health care provider may recommend certain vaccines, such as:  Influenza vaccine. This is recommended every year.  Tetanus, diphtheria, and acellular pertussis (Tdap, Td) vaccine. You may need a Td booster every 10  years.  Zoster vaccine. You may need this after age 98.  Pneumococcal 13-valent conjugate (PCV13) vaccine. One dose is recommended after age 58.  Pneumococcal polysaccharide (PPSV23) vaccine. One dose is recommended after age 37. Talk to your health care provider about which screenings and vaccines you need and how often you need them. This information is not intended to replace advice given to you by your health care provider. Make sure you discuss any questions you have with your health care provider. Document Released: 12/14/2015 Document Revised: 08/06/2016 Document Reviewed: 09/18/2015 Elsevier Interactive Patient Education  2017 [place] Prevention in the Home Falls can cause injuries. They can happen to people of all ages. There are many things you can do to make your home safe and to help prevent falls. What can I do on the outside of my home?  Regularly fix the edges of walkways and driveways and fix any cracks.  Remove anything that might make you trip as you walk through a door, such as a raised step or threshold.  Trim any bushes or trees on the path to your home.  Use bright outdoor lighting.  Clear any walking paths of anything that might make someone trip, such as rocks or tools.  Regularly check to see if handrails are loose or broken. Make sure that both sides of any steps have handrails.  Any raised decks and porches should have guardrails on the edges.  Have any leaves, snow, or ice cleared regularly.  Use sand or salt on walking paths during winter.  Clean up any spills in your garage right away. This includes oil or grease spills. What can I do in the bathroom?  Use night lights.  Install grab bars by the toilet and in the tub and shower. Do not use towel bars as grab bars.  Use non-skid mats or decals in the tub or shower.  If you need to sit down in the shower, use a plastic, non-slip stool.  Keep the floor dry. Clean up any water that  spills on the floor as soon as it happens.  Remove soap buildup in the tub or shower regularly.  Attach bath mats securely with double-sided non-slip rug tape.  Do not have throw rugs and other things on the floor that can make you trip. What can I do in the bedroom?  Use night lights.  Make sure that you have a light by your bed that is easy to reach.  Do not use any sheets or blankets that are too big for your bed. They should not hang down onto the floor.  Have a firm chair that has side arms. You can use this for support while you get dressed.  Do not have throw rugs and other things on the floor that can make you trip. What can I do in the kitchen?  Clean up any spills right away.  Avoid walking on wet floors.  Keep items that you use a lot in easy-to-reach places.  If you need to reach something above you, use a strong step stool that has a grab bar.  Keep electrical cords out of the way.  Do not use floor polish or wax that makes floors slippery. If you must use wax, use non-skid floor wax.  Do  not have throw rugs and other things on the floor that can make you trip. What can I do with my stairs?  Do not leave any items on the stairs.  Make sure that there are handrails on both sides of the stairs and use them. Fix handrails that are broken or loose. Make sure that handrails are as long as the stairways.  Check any carpeting to make sure that it is firmly attached to the stairs. Fix any carpet that is loose or worn.  Avoid having throw rugs at the top or bottom of the stairs. If you do have throw rugs, attach them to the floor with carpet tape.  Make sure that you have a light switch at the top of the stairs and the bottom of the stairs. If you do not have them, ask someone to add them for you. What else can I do to help prevent falls?  Wear shoes that:  Do not have high heels.  Have rubber bottoms.  Are comfortable and fit you well.  Are closed at the  toe. Do not wear sandals.  If you use a stepladder:  Make sure that it is fully opened. Do not climb a closed stepladder.  Make sure that both sides of the stepladder are locked into place.  Ask someone to hold it for you, if possible.  Clearly mark and make sure that you can see:  Any grab bars or handrails.  First and last steps.  Where the edge of each step is.  Use tools that help you move around (mobility aids) if they are needed. These include:  Canes.  Walkers.  Scooters.  Crutches.  Turn on the lights when you go into a dark area. Replace any light bulbs as soon as they burn out.  Set up your furniture so you have a clear path. Avoid moving your furniture around.  If any of your floors are uneven, fix them.  If there are any pets around you, be aware of where they are.  Review your medicines with your doctor. Some medicines can make you feel dizzy. This can increase your chance of falling. Ask your doctor what other things that you can do to help prevent falls. This information is not intended to replace advice given to you by your health care provider. Make sure you discuss any questions you have with your health care provider. Document Released: 09/13/2009 Document Revised: 04/24/2016 Document Reviewed: 12/22/2014 Elsevier Interactive Patient Education  2017 [employer].

## 2020-02-09 ENCOUNTER — Other Ambulatory Visit: Payer: Self-pay

## 2020-02-09 ENCOUNTER — Ambulatory Visit: Payer: Medicare Other

## 2020-02-09 DIAGNOSIS — R29898 Other symptoms and signs involving the musculoskeletal system: Secondary | ICD-10-CM

## 2020-02-09 DIAGNOSIS — R2689 Other abnormalities of gait and mobility: Secondary | ICD-10-CM

## 2020-02-09 DIAGNOSIS — R2681 Unsteadiness on feet: Secondary | ICD-10-CM

## 2020-02-09 DIAGNOSIS — M6281 Muscle weakness (generalized): Secondary | ICD-10-CM

## 2020-02-09 DIAGNOSIS — R278 Other lack of coordination: Secondary | ICD-10-CM

## 2020-02-09 NOTE — Therapy (Signed)
[place] MAIN [facility] SERVICES [address] [place], [place], [address] Phone: [phone]   Fax:  [phone]  Physical Therapy Treatment  Patient Details  Name: Jose Castro MRN: [id number] Date of Birth: February 05, 1926 Referring Provider (PT): Dr. Letta Pate   Encounter Date: 02/09/2020  PT End of Session - 02/09/20 1113    Visit Number  33    Number of Visits  49    Date for PT Re-Evaluation  03/06/20    PT Start Time  1105    PT Stop Time  1145    PT Time Calculation (min)  40 min    Equipment Utilized During Treatment  Gait belt    Activity Tolerance  Patient tolerated treatment well;Patient limited by fatigue    Behavior During Therapy  [facility] for tasks assessed/performed       Past Medical History:  Diagnosis Date  . Allergy    Penicillin  . Arthritis   . Basal cell carcinoma   . BPH (benign prostatic hyperplasia)   . Chronic kidney disease    had a kidney stone which per family was a cyst that was removed  . Dysrhythmia   . GERD (gastroesophageal reflux disease)   . History of kidney stones   . Hyperlipidemia   . Hypertension   . Left-sided low back pain with left-sided sciatica   . Splenic vein thrombosis   . Stroke ([place]) 05/22/2019  . Thrombosis 12/2015   mural  area and no notation of heart attack    Past Surgical History:  Procedure Laterality Date  . APPENDECTOMY    . CATARACT EXTRACTION, BILATERAL    . CYSTOSCOPY WITH INSERTION OF UROLIFT    . EYE SURGERY  08/2018  . KIDNEY STONE SURGERY    . KNEE SURGERY    . PACEMAKER INSERTION N/A 07/13/2018   Procedure: INSERTION PACEMAKER-DUEL CHAMBER INITIAL IMPLANT;  Surgeon: Isaias Cowman, MD;  Location: [facility] ORS;  Service: Cardiovascular;  Laterality: N/A;  . SPINE SURGERY      There were no vitals filed for this visit.  Subjective Assessment - 02/09/20 1112    Subjective  Pt doing well today. He had a wellness visit with PCP on Tuesday, but they deferred this  until later.    Pertinent History  Pt is a 86 year old male who presents with imbalance and difficulty with gait following a right frontal CVA with left hemiparesis on 05/22/19.  He has completed inpatient and home health PT, and now presents for OP PT.  He was discharged from inpatient rehab at a supervision level of assistance, ambulating with a RW.  His family notes that he is impulsive and will try to ambulate without his RW at home.  Family is currently providing 24/7 supervision.    Currently in Pain?  No/denies        TREATMENT  -Lateral side stepping in // bars c 3lb AW, 2x78f bilat, mild LOB with independent recovery  -retro gai tin // bars 1x427f-in // bars lateral step over SPAppalach[facility] Behavioral Health Carearget, 1x10 bilat, limited by chronic Left knee OA (common posteror LOB with left stepping) -normal stance foam x60sec, then 60sec headturns, then 60sec ball self toss (soccer)  -SPC gait training: 2x obstacle course c cones and red mat 3x8019fminguard assist SPC on right hand.       PT Short Term Goals - 02/01/20 0747      PT SHORT TERM GOAL #1   Title  Pt will be independent  with HEP in order to improve strength and balance in order to decrease fall risk and improve function at home and work.    Baseline  3/2 HEP compliant per patient report    Time  6    Period  Weeks    Status  On-going    Target Date  01/24/20        PT Long Term Goals - 02/01/20 0748      PT LONG TERM GOAL #1   Title  Pt will improve BERG to >50 in order to decrease his fall risk.    Baseline  09/21/19: 42/56, 11/03/19: 47/56; 12/13/19: 46/56 3/2: 46/56    Time  12    Period  Weeks    Status  On-going    Target Date  03/06/20      PT LONG TERM GOAL #2   Title  Pt will decrease TUG to below 14 seconds/decrease in order to demonstrate decreased fall risk.    Baseline  09/21/19: 22.19s; 11/03/19: 19.0s; 12/13/19: 17.0s 3/2: 14.97    Time  12    Period  Weeks    Status  Partially Met    Target Date  03/06/20      PT  LONG TERM GOAL #3   Title  Pt will decrease 5TSTS by at least 3 seconds in order to demonstrate clinically significant improvement in LE strength.    Baseline  09/21/19: 32.28s; 11/03/19: 15.5s; 12/13/19: 14.2s    Time  12    Period  Weeks    Status  Achieved      PT LONG TERM GOAL #4   Title  Pt will increase his 10MWT to >1.0 m/s in order to demonstrate a clinically significant improvement in gait speed and improved community ambulation    Baseline  09/21/19: Self-selected: 20.9 s = 0.48 m/s; Fastest: 17.1s =0.58 m/s; 11/03/19: Self-selected: 17.7s s = 0.56 m/s; Fastest: 13.5s =0.74 m/s with walker; 12/13/19: Self-selected:  12.6s =  0.79 m/s; Fastest: 12.2 = 0.82 m/s with walker 3/2: 0.94 m/s with walker    Time  12    Period  Weeks    Status  Partially Met    Target Date  03/06/20            Plan - 02/09/20 1124    Clinical Impression Statement  Pt able to complete entire session as planned with rest breaks provided as needed. Pt maintains high level of focus and motivation. Extensive verbal, visual, and tactile cues are provided for most accurate form possible. Author provides minA intermittently for full ROM when needed. Overall pt continues to make steady progress toward treatment goals.    PT Frequency  2x / week    PT Duration  12 weeks    PT Treatment/Interventions  ADLs/Self Care Home Management;Canalith Repostioning;Cryotherapy;Electrical Stimulation;Iontophoresis 70m/ml Dexamethasone;Moist Heat;Traction;Ultrasound;DME Instruction;Gait training;Stair training;Therapeutic activities;Therapeutic exercise;Functional mobility training;Balance training;Neuromuscular re-education;Patient/family education;Manual techniques;Dry needling;Vestibular;Joint Manipulations;Spinal Manipulations    PT Next Visit Plan  Update outcome measures/goals, progress strength and balance as well as gait with spc    PT Home Exercise Plan  Access Code: [email or web address]   Consulted and Agree with Plan of Care   Patient       Patient will benefit from skilled therapeutic intervention in order to improve the following deficits and impairments:  Abnormal gait, Decreased balance, Decreased endurance, Decreased mobility, Difficulty walking, Decreased knowledge of precautions, Decreased safety awareness, Decreased activity tolerance, Decreased strength, Hypermobility  Visit Diagnosis: Muscle weakness (generalized)  Unsteadiness on feet  Other lack of coordination  Other abnormalities of gait and mobility  Decreased strength of lower extremity     Problem List Patient Active Problem List   Diagnosis Date Noted  . Chronic venous insufficiency 12/05/2019  . 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 Great toe pain, right   . Lethargy   . CKD (chronic kidney disease), stage II   . Leukocytosis   . Benign essential HTN   . New onset atrial fibrillation ([place])   . Dysphagia, post-stroke   . 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 Acute ischemic right MCA stroke ([place]) 05/25/2019  . Goals of care, counseling/discussion   . Palliative care by specialist   . DNR (do not resuscitate) discussion   . Stroke, acute, embolic ([place]) 92/33/0076  . Persistent proteinuria 07/30/2018  . 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 Chronic kidney disease, stage III (moderate) 06/15/2018  . Leg pain 05/06/2018  . Lymphedema 05/06/2018  . Coagulopathy ([place]) 07/07/2017  . Lumbar spondylosis 03/10/2017  . Elevated uric acid in blood 11/20/2016  . BPH (benign prostatic hyperplasia) 05/29/2016  . Peripheral vascular disease of lower extremity ([place]) 03/03/2016  . Gallstone 12/31/2015  . 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 Atherosclerosis of aorta ([place]) 11/22/2015  . Carotid artery narrowing 11/22/2015  . Diverticulosis of colon 11/22/2015  . Decreased creatinine clearance 11/22/2015  . Arthritis, degenerative 11/22/2015  . Lactose  intolerance 11/22/2015  . Basal cell carcinoma 11/22/2015  . Essential hypertension 06/21/2015  . Hyperlipemia 06/21/2015  . GERD (gastroesophageal reflux disease) 06/21/2015  . Calculus of kidney 11/18/2013   11:47 AM, 02/09/20 Etta Grandchild, PT, DPT Physical Therapist - [facility]  Outpatient Physical Therapy- [place] [phone]     Etta Grandchild 02/09/2020, 11:31 AM  [place] MAIN [facility] SERVICES [address] [place], [place], [address] Phone: [phone]   Fax:  [phone]  Name: MISHAWN HEMANN MRN: [id number] Date of Birth: [birth year]

## 2020-02-14 ENCOUNTER — Other Ambulatory Visit: Payer: Self-pay

## 2020-02-14 ENCOUNTER — Encounter: Payer: Medicare Other | Admitting: Occupational Therapy

## 2020-02-14 ENCOUNTER — Ambulatory Visit: Payer: Medicare Other

## 2020-02-14 VITALS — BP 131/76 | HR 85

## 2020-02-14 DIAGNOSIS — M6281 Muscle weakness (generalized): Secondary | ICD-10-CM

## 2020-02-14 DIAGNOSIS — R2681 Unsteadiness on feet: Secondary | ICD-10-CM

## 2020-02-14 NOTE — Therapy (Signed)
[place] MAIN [facility] SERVICES [address] [place], [place], [address] Phone: [phone]   Fax:  [phone]  Physical Therapy Treatment  Patient Details  Name: Jose Castro MRN: [id number] Date of Birth: [birth year] Referring Provider (PT): Dr. Letta Pate   Encounter Date: 02/14/2020  PT End of Session - 02/14/20 1100    Visit Number  34    Number of Visits  49    Date for PT Re-Evaluation  03/06/20    PT Start Time  1100    PT Stop Time  1145    PT Time Calculation (min)  45 min    Equipment Utilized During Treatment  Gait belt    Activity Tolerance  Patient tolerated treatment well;Patient limited by fatigue    Behavior During Therapy  [facility] for tasks assessed/performed       Past Medical History:  Diagnosis Date  . Allergy    Penicillin  . Arthritis   . Basal cell carcinoma   . BPH (benign prostatic hyperplasia)   . Chronic kidney disease    had a kidney stone which per family was a cyst that was removed  . Dysrhythmia   . GERD (gastroesophageal reflux disease)   . History of kidney stones   . Hyperlipidemia   . Hypertension   . Left-sided low back pain with left-sided sciatica   . Splenic vein thrombosis   . Stroke ([place]) 05/22/2019  . Thrombosis 12/2015   mural  area and no notation of heart attack    Past Surgical History:  Procedure Laterality Date  . APPENDECTOMY    . CATARACT EXTRACTION, BILATERAL    . CYSTOSCOPY WITH INSERTION OF UROLIFT    . EYE SURGERY  08/2018  . KIDNEY STONE SURGERY    . KNEE SURGERY    . PACEMAKER INSERTION N/A 07/13/2018   Procedure: INSERTION PACEMAKER-DUEL CHAMBER INITIAL IMPLANT;  Surgeon: Isaias Cowman, MD;  Location: [facility] ORS;  Service: Cardiovascular;  Laterality: N/A;  . SPINE SURGERY      Vitals:   02/14/20 1103  BP: 131/76  Pulse: 85    Subjective Assessment - 02/14/20 1059    Subjective  Pt doing well today. Denies any L knee upon arrival. No recent changes in health  or medication. No falls. No specific questions or concerns.    Pertinent History  Pt is a 86 year old male who presents with imbalance and difficulty with gait following a right frontal CVA with left hemiparesis on 05/22/19.  He has completed inpatient and home health PT, and now presents for OP PT.  He was discharged from inpatient rehab at a supervision level of assistance, ambulating with a RW.  His family notes that he is impulsive and will try to ambulate without his RW at home.  Family is currently providing 24/7 supervision.    Limitations  House hold activities;Walking    How long can you sit comfortably?  no limitations    How long can you walk comfortably?  he requires a RW at home    Patient Stated Goals  get back to normal; want to be able to play golf and go out to play trivia again.  His daughters want him to regain independence with going to the bathroom at night    Currently in Pain?  No/denies         TREATMENT   Neuromuscular Re-education Dynamic balance during ambulation withsingle point caneapproximately400'today. Ptperformedhorizontal and vertical head turns with cues from therapist as well  as gait speed changes.Practiced opening/closing multiple doors and education provided about switching hands with cane in order to safely open doors without losing balance; 6" alternating forward and lateral step taps without UE support x 10 each both directions; Obstacle course with patient stepping over 2 half-foam rollers, step-up and over both 4" and 6" steps, and step-up and over 2" Airex pad, performed twice; Step-ups to 4" step with 2" Airex pad on top leading with RLE x 5; R lateral step-ups to 4" step x 10; AirexNBOS dart board throws x 5 minutes to challenge dynamic balance;   Therapeutic Exercise Seated hip flexion marching with 4# ankle weights x 20 bilateral; Seated clams with manual resistance x 20 bilateral; Seated adductor squeeze with manual resistance x  20 bilateral; Seated R LAQ with4# ankle weights x 20; Standing heel raises with BUE support x 10; Standing marches with 4# ankle weights x 20 bilateral; Standing hip abduction with 4# ankle weights x 20 bilateral;   Pt educated throughout session about proper posture and technique with exercises. Improved exercise technique, movement at target joints, use of target muscles after min to mod verbal, visual, tactile cues.   Practiced gait again with patient today and worked on opening/closing doors while using a cane. He continues to require cues for changing hands with cane in order to complete safely. Performed dynamic balance with patient utilizing obstacle course including stepping over objects as well as stepping up a variety of heights/surfaces. Continued with seated strengthening to minimize discomfort in standing with L knee.Pt encouraged to continue his HEP.Hewillcontinue tobenefit from skilled PT services to address deficits in balance and decrease risk for future falls.                          PT Short Term Goals - 02/01/20 0747      PT SHORT TERM GOAL #1   Title  Pt will be independent with HEP in order to improve strength and balance in order to decrease fall risk and improve function at home and work.    Baseline  3/2 HEP compliant per patient report    Time  6    Period  Weeks    Status  On-going    Target Date  01/24/20        PT Long Term Goals - 02/01/20 0748      PT LONG TERM GOAL #1   Title  Pt will improve BERG to >50 in order to decrease his fall risk.    Baseline  09/21/19: 42/56, 11/03/19: 47/56; 12/13/19: 46/56 3/2: 46/56    Time  12    Period  Weeks    Status  On-going    Target Date  03/06/20      PT LONG TERM GOAL #2   Title  Pt will decrease TUG to below 14 seconds/decrease in order to demonstrate decreased fall risk.    Baseline  09/21/19: 22.19s; 11/03/19: 19.0s; 12/13/19: 17.0s 3/2: 14.97    Time  12    Period   Weeks    Status  Partially Met    Target Date  03/06/20      PT LONG TERM GOAL #3   Title  Pt will decrease 5TSTS by at least 3 seconds in order to demonstrate clinically significant improvement in LE strength.    Baseline  09/21/19: 32.28s; 11/03/19: 15.5s; 12/13/19: 14.2s    Time  12    Period  Weeks  Status  Achieved      PT LONG TERM GOAL #4   Title  Pt will increase his 10MWT to >1.0 m/s in order to demonstrate a clinically significant improvement in gait speed and improved community ambulation    Baseline  09/21/19: Self-selected: 20.9 s = 0.48 m/s; Fastest: 17.1s =0.58 m/s; 11/03/19: Self-selected: 17.7s s = 0.56 m/s; Fastest: 13.5s =0.74 m/s with walker; 12/13/19: Self-selected:  12.6s =  0.79 m/s; Fastest: 12.2 = 0.82 m/s with walker 3/2: 0.94 m/s with walker    Time  12    Period  Weeks    Status  Partially Met    Target Date  03/06/20            Plan - 02/14/20 1102    Clinical Impression Statement  Practiced gait again with patient today and worked on opening/closing doors while using a cane. He continues to require cues for changing hands with cane in order to complete safely. Performed dynamic balance with patient utilizing obstacle course including stepping over objects as well as stepping up a variety of heights/surfaces. Continued with seated strengthening to minimize discomfort in standing with L knee. Pt encouraged to continue his HEP. He will continue to benefit from skilled PT services to address deficits in balance and decrease risk for future falls.    Personal Factors and Comorbidities  Age;Comorbidity 2    Comorbidities  HTN, CVA    Examination-Activity Limitations  Bend;Lift;Squat;Locomotion Level;Stairs;Stand    Examination-Participation Restrictions  Community Activity;Driving    Clinical Decision Making  Moderate    Rehab Potential  Good    PT Frequency  2x / week    PT Duration  12 weeks    PT Treatment/Interventions  ADLs/Self Care Home  Management;Canalith Repostioning;Cryotherapy;Electrical Stimulation;Iontophoresis 47m/ml Dexamethasone;Moist Heat;Traction;Ultrasound;DME Instruction;Gait training;Stair training;Therapeutic activities;Therapeutic exercise;Functional mobility training;Balance training;Neuromuscular re-education;Patient/family education;Manual techniques;Dry needling;Vestibular;Joint Manipulations;Spinal Manipulations    PT Next Visit Plan  Update outcome measures/goals, progress strength and balance as well as gait with spc    PT Home Exercise Plan  Access Code: [email or web address]   Consulted and Agree with Plan of Care  Patient       Patient will benefit from skilled therapeutic intervention in order to improve the following deficits and impairments:  Abnormal gait, Decreased balance, Decreased endurance, Decreased mobility, Difficulty walking, Decreased knowledge of precautions, Decreased safety awareness, Decreased activity tolerance, Decreased strength, Hypermobility  Visit Diagnosis: Muscle weakness (generalized)  Unsteadiness on feet     Problem List Patient Active Problem List   Diagnosis Date Noted  . Chronic venous insufficiency 12/05/2019  . 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 Great toe pain, right   . Lethargy   . CKD (chronic kidney disease), stage II   . Leukocytosis   . Benign essential HTN   . New onset atrial fibrillation ([place]   . Dysphagia, post-stroke   . Right middle cerebral artery stroke ([place] 05/27/2019  . Acute ischemic right MCA stroke ([place] 05/25/2019  . Goals of care, counseling/discussion   . Palliative care by specialist   . DNR (do not resuscitate) discussion   . Stroke, acute, embolic ([place] 049/67/5916 . Persistent proteinuria 07/30/2018  . 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 Chronic kidney disease, stage III (moderate) 06/15/2018  . Leg pain 05/06/2018  . Lymphedema 05/06/2018  . Coagulopathy ([place] 07/07/2017  . Lumbar spondylosis  03/10/2017  . Elevated uric acid in blood 11/20/2016  . BPH (benign prostatic  hyperplasia) 05/29/2016  . Peripheral vascular disease of lower extremity ([place]) 03/03/2016  . Gallstone 12/31/2015  . 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 Atherosclerosis of aorta ([place]) 11/22/2015  . Carotid artery narrowing 11/22/2015  . Diverticulosis of colon 11/22/2015  . Decreased creatinine clearance 11/22/2015  . Arthritis, degenerative 11/22/2015  . Lactose intolerance 11/22/2015  . Basal cell carcinoma 11/22/2015  . Essential hypertension 06/21/2015  . Hyperlipemia 06/21/2015  . GERD (gastroesophageal reflux disease) 06/21/2015  . Calculus of kidney 11/18/2013   Phillips Grout PT, DPT, GCS  Oretta Berkland 02/14/2020, 3:38 PM  [place] MAIN [facility] SERVICES [address] [place], [place], [address] Phone: [phone]   Fax:  [phone]  Name: Jose Castro MRN: [id number] Date of Birth: [birth year]

## 2020-02-16 ENCOUNTER — Other Ambulatory Visit: Payer: Self-pay

## 2020-02-16 ENCOUNTER — Ambulatory Visit: Payer: Medicare Other

## 2020-02-16 DIAGNOSIS — R278 Other lack of coordination: Secondary | ICD-10-CM

## 2020-02-16 DIAGNOSIS — M6281 Muscle weakness (generalized): Secondary | ICD-10-CM

## 2020-02-16 DIAGNOSIS — R2681 Unsteadiness on feet: Secondary | ICD-10-CM

## 2020-02-16 DIAGNOSIS — R2689 Other abnormalities of gait and mobility: Secondary | ICD-10-CM

## 2020-02-16 NOTE — Therapy (Signed)
[place] MAIN [facility] SERVICES [address] [place], [place], [address] Phone: [phone]   Fax:  [phone]  Physical Therapy Treatment  Patient Details  Name: Jose Castro MRN: [id number] Date of Birth: [birth year] Referring Provider (PT): Dr. Letta Pate   Encounter Date: 02/16/2020  PT End of Session - 02/16/20 1121    Visit Number  35    Number of Visits  49    Date for PT Re-Evaluation  03/06/20    PT Start Time  1115    PT Stop Time  1159    PT Time Calculation (min)  44 min    Equipment Utilized During Treatment  Gait belt    Activity Tolerance  Patient tolerated treatment well;Patient limited by fatigue    Behavior During Therapy  [facility] for tasks assessed/performed       Past Medical History:  Diagnosis Date  . Allergy    Penicillin  . Arthritis   . Basal cell carcinoma   . BPH (benign prostatic hyperplasia)   . Chronic kidney disease    had a kidney stone which per family was a cyst that was removed  . Dysrhythmia   . GERD (gastroesophageal reflux disease)   . History of kidney stones   . Hyperlipidemia   . Hypertension   . Left-sided low back pain with left-sided sciatica   . Splenic vein thrombosis   . Stroke ([place]) 05/22/2019  . Thrombosis 12/2015   mural  area and no notation of heart attack    Past Surgical History:  Procedure Laterality Date  . APPENDECTOMY    . CATARACT EXTRACTION, BILATERAL    . CYSTOSCOPY WITH INSERTION OF UROLIFT    . EYE SURGERY  08/2018  . KIDNEY STONE SURGERY    . KNEE SURGERY    . PACEMAKER INSERTION N/A 07/13/2018   Procedure: INSERTION PACEMAKER-DUEL CHAMBER INITIAL IMPLANT;  Surgeon: Isaias Cowman, MD;  Location: [facility] ORS;  Service: Cardiovascular;  Laterality: N/A;  . SPINE SURGERY      There were no vitals filed for this visit.  Subjective Assessment - 02/16/20 1120    Subjective  Patient reports no falls or LOB since last session. His son from [place] is coming to visit  today.    Pertinent History  Pt is a 86 year old male who presents with imbalance and difficulty with gait following a right frontal CVA with left hemiparesis on 05/22/19.  He has completed inpatient and home health PT, and now presents for OP PT.  He was discharged from inpatient rehab at a supervision level of assistance, ambulating with a RW.  His family notes that he is impulsive and will try to ambulate without his RW at home.  Family is currently providing 24/7 supervision.    Limitations  House hold activities;Walking    How long can you sit comfortably?  no limitations    How long can you walk comfortably?  he requires a RW at home    Patient Stated Goals  get back to normal; want to be able to play golf and go out to play trivia again.  His daughters want him to regain independence with going to the bathroom at night    Currently in Pain?  No/denies            TREATMENT    Nustep Lvl 4 RPM> 60 for cardiovascular challenge 4 minutes  Neuromuscular Re-education   airex pad: one foot on 6" step static stand in modified position  each LE 30 seconds x 2 trials each LE airex pad: SUE support 6" step toe taps 12x each LE airex pad: SUE support 6" step lateral toe taps 12x each LE Balloon taps with CGA by PT and reciprocating taps from son reaching inside/outside BOS without UE support for righting reactions, stability, and muscle activation sequencing x 3 minutes Ambulate 96 ft with horizontal and vertical head turns scanning room for cones, squat with SPC to pick up cones in varying placements in gym ; close CGA, cueing for scanning.  Step over orange hurdle and back 15x each LE   SUE support cone taps 3 cones in front, UE support decreased to no UE support with gentle progressions x 8 each LE  Therapeutic Exercise RTB hamstring curl seated 15x Standing modified lateral squat steps 4x length of // bars (initially with RTB around ankles: took band off due to L knee pain)   ambulate 96 ft  with SPC cues for widening BOS,scanning environment, and use of AD. Close CGA x2 trials  Pt educated throughout session about proper posture and technique with exercises. Improved exercise technique, movement at target joints, use of target muscles after min to mod verbal, visual, tactile cues.                      PT Education - 02/16/20 1121    Education Details  exercise technique, body mechanics    Person(s) Educated  Patient    Methods  Explanation;Demonstration;Tactile cues;Verbal cues    Comprehension  Verbalized understanding;Returned demonstration;Verbal cues required;Tactile cues required       PT Short Term Goals - 02/01/20 0747      PT SHORT TERM GOAL #1   Title  Pt will be independent with HEP in order to improve strength and balance in order to decrease fall risk and improve function at home and work.    Baseline  3/2 HEP compliant per patient report    Time  6    Period  Weeks    Status  On-going    Target Date  01/24/20        PT Long Term Goals - 02/01/20 0748      PT LONG TERM GOAL #1   Title  Pt will improve BERG to >50 in order to decrease his fall risk.    Baseline  09/21/19: 42/56, 11/03/19: 47/56; 12/13/19: 46/56 3/2: 46/56    Time  12    Period  Weeks    Status  On-going    Target Date  03/06/20      PT LONG TERM GOAL #2   Title  Pt will decrease TUG to below 14 seconds/decrease in order to demonstrate decreased fall risk.    Baseline  09/21/19: 22.19s; 11/03/19: 19.0s; 12/13/19: 17.0s 3/2: 14.97    Time  12    Period  Weeks    Status  Partially Met    Target Date  03/06/20      PT LONG TERM GOAL #3   Title  Pt will decrease 5TSTS by at least 3 seconds in order to demonstrate clinically significant improvement in LE strength.    Baseline  09/21/19: 32.28s; 11/03/19: 15.5s; 12/13/19: 14.2s    Time  12    Period  Weeks    Status  Achieved      PT LONG TERM GOAL #4   Title  Pt will increase his 10MWT to >1.0 m/s in order to  demonstrate a clinically significant improvement  in gait speed and improved community ambulation    Baseline  09/21/19: Self-selected: 20.9 s = 0.48 m/s; Fastest: 17.1s =0.58 m/s; 11/03/19: Self-selected: 17.7s s = 0.56 m/s; Fastest: 13.5s =0.74 m/s with walker; 12/13/19: Self-selected:  12.6s =  0.79 m/s; Fastest: 12.2 = 0.82 m/s with walker 3/2: 0.94 m/s with walker    Time  12    Period  Weeks    Status  Partially Met    Target Date  03/06/20            Plan - 02/16/20 1422    Clinical Impression Statement  Patient is challenged by ambulating with visual scans to left and right resulting in compensatory gait mechanics. Frequent cueing for body mechanics and task orientation required. Left knee pain limited full functional activities due to limited tolerance against resistance. He will continue to benefit from skilled PT services to address deficits in balance and decrease risk for future falls.    Personal Factors and Comorbidities  Age;Comorbidity 2    Comorbidities  HTN, CVA    Examination-Activity Limitations  Bend;Lift;Squat;Locomotion Level;Stairs;Stand    Examination-Participation Restrictions  Community Activity;Driving    Rehab Potential  Good    PT Frequency  2x / week    PT Duration  12 weeks    PT Treatment/Interventions  ADLs/Self Care Home Management;Canalith Repostioning;Cryotherapy;Electrical Stimulation;Iontophoresis 23m/ml Dexamethasone;Moist Heat;Traction;Ultrasound;DME Instruction;Gait training;Stair training;Therapeutic activities;Therapeutic exercise;Functional mobility training;Balance training;Neuromuscular re-education;Patient/family education;Manual techniques;Dry needling;Vestibular;Joint Manipulations;Spinal Manipulations    PT Next Visit Plan  Update outcome measures/goals, progress strength and balance as well as gait with spc    PT Home Exercise Plan  Access Code: [email or web address]   Consulted and Agree with Plan of Care  Patient       Patient will benefit from  skilled therapeutic intervention in order to improve the following deficits and impairments:  Abnormal gait, Decreased balance, Decreased endurance, Decreased mobility, Difficulty walking, Decreased knowledge of precautions, Decreased safety awareness, Decreased activity tolerance, Decreased strength, Hypermobility  Visit Diagnosis: Muscle weakness (generalized)  Unsteadiness on feet  Other lack of coordination  Other abnormalities of gait and mobility     Problem List Patient Active Problem List   Diagnosis Date Noted  . Chronic venous insufficiency 12/05/2019  . 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 Great toe pain, right   . Lethargy   . CKD (chronic kidney disease), stage II   . Leukocytosis   . Benign essential HTN   . New onset atrial fibrillation ([place]   . Dysphagia, post-stroke   . Right middle cerebral artery stroke ([place] 05/27/2019  . Acute ischemic right MCA stroke ([place] 05/25/2019  . Goals of care, counseling/discussion   . Palliative care by specialist   . DNR (do not resuscitate) discussion   . Stroke, acute, embolic ([place] 009/38/1829 . Persistent proteinuria 07/30/2018  . 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 Chronic kidney disease, stage III (moderate) 06/15/2018  . Leg pain 05/06/2018  . Lymphedema 05/06/2018  . Coagulopathy ([place] 07/07/2017  . Lumbar spondylosis 03/10/2017  . Elevated uric acid in blood 11/20/2016  . BPH (benign prostatic hyperplasia) 05/29/2016  . Peripheral vascular disease of lower extremity ([place] 03/03/2016  . Gallstone 12/31/2015  . 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 Atherosclerosis of aorta ([place] 11/22/2015  . Carotid artery narrowing 11/22/2015  . Diverticulosis of colon 11/22/2015  . Decreased creatinine clearance 11/22/2015  . Arthritis, degenerative 11/22/2015  . Lactose  intolerance 11/22/2015  . Basal cell carcinoma 11/22/2015  . Essential hypertension  06/21/2015  . Hyperlipemia 06/21/2015  . GERD (gastroesophageal reflux disease) 06/21/2015  . Calculus of kidney 11/18/2013   Janna Arch, PT, DPT   02/16/2020, 2:24 PM  [place] MAIN [facility] SERVICES [address] [place], [place], [address] Phone: [phone]   Fax:  [phone]  Name: Jose Castro MRN: [id number] Date of Birth: September 24, 1926

## 2020-02-21 ENCOUNTER — Ambulatory Visit: Payer: Medicare Other | Attending: Internal Medicine

## 2020-02-21 ENCOUNTER — Encounter: Payer: Medicare Other | Admitting: Occupational Therapy

## 2020-02-21 ENCOUNTER — Encounter: Payer: Self-pay | Admitting: Physical Therapy

## 2020-02-21 ENCOUNTER — Ambulatory Visit: Payer: Medicare Other | Admitting: Physical Therapy

## 2020-02-21 DIAGNOSIS — R2681 Unsteadiness on feet: Secondary | ICD-10-CM

## 2020-02-21 DIAGNOSIS — R278 Other lack of coordination: Secondary | ICD-10-CM

## 2020-02-21 DIAGNOSIS — R2689 Other abnormalities of gait and mobility: Secondary | ICD-10-CM

## 2020-02-21 DIAGNOSIS — Z23 Encounter for immunization: Secondary | ICD-10-CM

## 2020-02-21 DIAGNOSIS — M6281 Muscle weakness (generalized): Secondary | ICD-10-CM

## 2020-02-21 NOTE — Therapy (Signed)
[place] MAIN [facility] SERVICES [address] [place], [place], [address] Phone: [phone]   Fax:  [phone]  Physical Therapy Treatment  Patient Details  Name: Jose Castro MRN: [id number] Date of Birth: 1926-06-28 Referring Provider (PT): Dr. Letta Pate   Encounter Date: 02/21/2020  PT End of Session - 02/21/20 1017    Visit Number  36    Number of Visits  49    Date for PT Re-Evaluation  03/06/20    PT Start Time  1017    PT Stop Time  1100    PT Time Calculation (min)  43 min    Equipment Utilized During Treatment  Gait belt    Activity Tolerance  Patient tolerated treatment well;Patient limited by fatigue    Behavior During Therapy  [facility] for tasks assessed/performed       Past Medical History:  Diagnosis Date  . Allergy    Penicillin  . Arthritis   . Basal cell carcinoma   . BPH (benign prostatic hyperplasia)   . Chronic kidney disease    had a kidney stone which per family was a cyst that was removed  . Dysrhythmia   . GERD (gastroesophageal reflux disease)   . History of kidney stones   . Hyperlipidemia   . Hypertension   . Left-sided low back pain with left-sided sciatica   . Splenic vein thrombosis   . Stroke ([place]) 05/22/2019  . Thrombosis 12/2015   mural  area and no notation of heart attack    Past Surgical History:  Procedure Laterality Date  . APPENDECTOMY    . CATARACT EXTRACTION, BILATERAL    . CYSTOSCOPY WITH INSERTION OF UROLIFT    . EYE SURGERY  08/2018  . KIDNEY STONE SURGERY    . KNEE SURGERY    . PACEMAKER INSERTION N/A 07/13/2018   Procedure: INSERTION PACEMAKER-DUEL CHAMBER INITIAL IMPLANT;  Surgeon: Isaias Cowman, MD;  Location: [facility] ORS;  Service: Cardiovascular;  Laterality: N/A;  . SPINE SURGERY      There were no vitals filed for this visit.  Subjective Assessment - 02/21/20 1024    Subjective  Pt reports doing well; Denies any falls. Reports some fatigue from an early appointment.  Otherwise is doing well;    Pertinent History  Pt is a 86 year old male who presents with imbalance and difficulty with gait following a right frontal CVA with left hemiparesis on 05/22/19.  He has completed inpatient and home health PT, and now presents for OP PT.  He was discharged from inpatient rehab at a supervision level of assistance, ambulating with a RW.  His family notes that he is impulsive and will try to ambulate without his RW at home.  Family is currently providing 24/7 supervision.    Limitations  House hold activities;Walking    How long can you sit comfortably?  no limitations    How long can you walk comfortably?  he requires a RW at home    Patient Stated Goals  get back to normal; want to be able to play golf and go out to play trivia again.  His daughters want him to regain independence with going to the bathroom at night    Currently in Pain?  No/denies    Multiple Pain Sites  No          TREATMENT  Warm up on crosstrainer, level 2 BUE/BLE x4 min with cues to increase speed to >40 for cardiovascular challenge;   Neuromuscular Re-education  Airex pad:  Feet together   Unsupported 30 sec hold   Alternate UE lift x5 reps bilaterally   BUE arms across chest, trunk rotation with head turns side/side x10 reps; with CGA  Alternate LE toe taps to 6 inch step unsupported x15 reps each;  Tandem stance:   Unsupported head turns side/side x5 reps with min A for safety;  Weaving around cones #5 with SPC x3 laps with min A for safety and cues to increase step length for better gait safety; Picking up cones #5 with SPC with CGA for safety with good positioning and safety awareness;  Patient ambulated 150 feet x2 with SPC with cues for erect posture, increase step length and improve erect head for visual cues and safety awareness; Patient instructed in gait with head turns side/side to challenge dynamic balance. He does exhibit mild veering with lateral head turns requiring min  A for safety;   Pt educated throughout session about proper posture and technique with exercises. Improved exercise technique, movement at target joints, use of target muscles after min to mod verbal, visual, tactile cues.   Tolerated session fair. He does report increased fatigue this session and complains of increased LLE knee discomfort with prolonged ambulation requiring short seated rest breaks;                     PT Education - 02/21/20 1016    Education Details  exercise technique, balance, gait safety, HEP reinforced;    Person(s) Educated  Patient    Methods  Explanation;Verbal cues    Comprehension  Verbalized understanding;Returned demonstration;Verbal cues required;Need further instruction       PT Short Term Goals - 02/01/20 0747      PT SHORT TERM GOAL #1   Title  Pt will be independent with HEP in order to improve strength and balance in order to decrease fall risk and improve function at home and work.    Baseline  3/2 HEP compliant per patient report    Time  6    Period  Weeks    Status  On-going    Target Date  01/24/20        PT Long Term Goals - 02/01/20 0748      PT LONG TERM GOAL #1   Title  Pt will improve BERG to >50 in order to decrease his fall risk.    Baseline  09/21/19: 42/56, 11/03/19: 47/56; 12/13/19: 46/56 3/2: 46/56    Time  12    Period  Weeks    Status  On-going    Target Date  03/06/20      PT LONG TERM GOAL #2   Title  Pt will decrease TUG to below 14 seconds/decrease in order to demonstrate decreased fall risk.    Baseline  09/21/19: 22.19s; 11/03/19: 19.0s; 12/13/19: 17.0s 3/2: 14.97    Time  12    Period  Weeks    Status  Partially Met    Target Date  03/06/20      PT LONG TERM GOAL #3   Title  Pt will decrease 5TSTS by at least 3 seconds in order to demonstrate clinically significant improvement in LE strength.    Baseline  09/21/19: 32.28s; 11/03/19: 15.5s; 12/13/19: 14.2s    Time  12    Period  Weeks     Status  Achieved      PT LONG TERM GOAL #4   Title  Pt will increase his 10MWT to >1.0  m/s in order to demonstrate a clinically significant improvement in gait speed and improved community ambulation    Baseline  09/21/19: Self-selected: 20.9 s = 0.48 m/s; Fastest: 17.1s =0.58 m/s; 11/03/19: Self-selected: 17.7s s = 0.56 m/s; Fastest: 13.5s =0.74 m/s with walker; 12/13/19: Self-selected:  12.6s =  0.79 m/s; Fastest: 12.2 = 0.82 m/s with walker 3/2: 0.94 m/s with walker    Time  12    Period  Weeks    Status  Partially Met    Target Date  03/06/20            Plan - 02/21/20 1127    Clinical Impression Statement  Patient motivated and participated well within session. He does report increased fatigue today likely due to earlier appointment. He also seemed more distracted and worried regarding getting 2nd covid vaccine today. Patient instructed in advanced balance tasks to challenge stance control. Utilized compliant surface to increase challenge. Patient does exhibit decreased ankle stability with increased shakiness with unsupported standing. He was instructed in gait with SPC to challenge gait safety. Patient does require min A throughout session for righting and steadying. Patient would benefit from additional skilled PT intervention to improve strength, balance and gait safety;    Personal Factors and Comorbidities  Age;Comorbidity 2    Comorbidities  HTN, CVA    Examination-Activity Limitations  Bend;Lift;Squat;Locomotion Level;Stairs;Stand    Examination-Participation Restrictions  Community Activity;Driving    Rehab Potential  Good    PT Frequency  2x / week    PT Duration  12 weeks    PT Treatment/Interventions  ADLs/Self Care Home Management;Canalith Repostioning;Cryotherapy;Electrical Stimulation;Iontophoresis 32m/ml Dexamethasone;Moist Heat;Traction;Ultrasound;DME Instruction;Gait training;Stair training;Therapeutic activities;Therapeutic exercise;Functional mobility training;Balance  training;Neuromuscular re-education;Patient/family education;Manual techniques;Dry needling;Vestibular;Joint Manipulations;Spinal Manipulations    PT Next Visit Plan  Update outcome measures/goals, progress strength and balance as well as gait with spc    PT Home Exercise Plan  Access Code: [email or web address]   Consulted and Agree with Plan of Care  Patient       Patient will benefit from skilled therapeutic intervention in order to improve the following deficits and impairments:  Abnormal gait, Decreased balance, Decreased endurance, Decreased mobility, Difficulty walking, Decreased knowledge of precautions, Decreased safety awareness, Decreased activity tolerance, Decreased strength, Hypermobility  Visit Diagnosis: Muscle weakness (generalized)  Unsteadiness on feet  Other lack of coordination  Other abnormalities of gait and mobility     Problem List Patient Active Problem List   Diagnosis Date Noted  . Chronic venous insufficiency 12/05/2019  . 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 Great toe pain, right   . Lethargy   . CKD (chronic kidney disease), stage II   . Leukocytosis   . Benign essential HTN   . New onset atrial fibrillation ([place]   . Dysphagia, post-stroke   . Right middle cerebral artery stroke ([place] 05/27/2019  . Acute ischemic right MCA stroke ([place] 05/25/2019  . Goals of care, counseling/discussion   . Palliative care by specialist   . DNR (do not resuscitate) discussion   . Stroke, acute, embolic ([place] 033/82/5053 . Persistent proteinuria 07/30/2018  . 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 Chronic kidney disease, stage III (moderate) 06/15/2018  . Leg pain 05/06/2018  . Lymphedema 05/06/2018  . Coagulopathy ([place] 07/07/2017  . Lumbar spondylosis 03/10/2017  . Elevated uric acid in blood 11/20/2016  . BPH (benign prostatic hyperplasia) 05/29/2016  . Peripheral vascular disease of lower extremity (HD[place] 03/03/2016  .  Gallstone  12/31/2015  . 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 Atherosclerosis of aorta ([place]) 11/22/2015  . Carotid artery narrowing 11/22/2015  . Diverticulosis of colon 11/22/2015  . Decreased creatinine clearance 11/22/2015  . Arthritis, degenerative 11/22/2015  . Lactose intolerance 11/22/2015  . Basal cell carcinoma 11/22/2015  . Essential hypertension 06/21/2015  . Hyperlipemia 06/21/2015  . GERD (gastroesophageal reflux disease) 06/21/2015  . Calculus of kidney 11/18/2013    Jose Castro PT, DPT 02/21/2020, 11:30 AM  [place] MAIN [facility] [place] SERVICES [address] [place], [place], [address] Phone: [phone]   Fax:  [phone]  Name: Jose Castro MRN: [id number] Date of Birth: 02-21-1926

## 2020-02-21 NOTE — Progress Notes (Signed)
   Covid-19 Vaccination Clinic  Name:  Jose Castro    MRN: [id number] DOB: 1926/07/09  02/21/2020  [name] was observed post Covid-19 immunization for 15 minutes without incident. He was provided with Vaccine Information Sheet and instruction to access the V-Safe system.   [name] was instructed to call 911 with any severe reactions post vaccine: Marland Kitchen Difficulty breathing  . Swelling of face and throat  . A fast heartbeat  . A bad rash all over body  . Dizziness and weakness   Immunizations Administered    Name Date Dose VIS Date Route   Moderna COVID-19 Vaccine 02/21/2020  1:55 PM 0.5 mL 11/01/2019 Intramuscular   Manufacturer: Levan Hurst   Lot: [id number]   [place]: [id number]

## 2020-02-23 ENCOUNTER — Ambulatory Visit: Payer: Medicare Other

## 2020-02-23 ENCOUNTER — Other Ambulatory Visit: Payer: Self-pay

## 2020-02-23 DIAGNOSIS — M6281 Muscle weakness (generalized): Secondary | ICD-10-CM

## 2020-02-23 DIAGNOSIS — R2681 Unsteadiness on feet: Secondary | ICD-10-CM

## 2020-02-23 NOTE — Therapy (Signed)
[place] MAIN [facility] SERVICES [address] [place], [place], [address] Phone: [phone]   Fax:  [phone]  Physical Therapy Treatment  Patient Details  Name: Jose Castro MRN: [id number] Date of Birth: [birth year] Referring Provider (PT): Dr. Letta Pate   Encounter Date: 02/23/2020  PT End of Session - 02/23/20 1117    Visit Number  37    Number of Visits  49    Date for PT Re-Evaluation  03/06/20    PT Start Time  1113    PT Stop Time  1155    PT Time Calculation (min)  42 min    Equipment Utilized During Treatment  Gait belt    Activity Tolerance  Patient tolerated treatment well;Patient limited by fatigue    Behavior During Therapy  [facility] for tasks assessed/performed       Past Medical History:  Diagnosis Date  . Allergy    Penicillin  . Arthritis   . Basal cell carcinoma   . BPH (benign prostatic hyperplasia)   . Chronic kidney disease    had a kidney stone which per family was a cyst that was removed  . Dysrhythmia   . GERD (gastroesophageal reflux disease)   . History of kidney stones   . Hyperlipidemia   . Hypertension   . Left-sided low back pain with left-sided sciatica   . Splenic vein thrombosis   . Stroke ([place]) 05/22/2019  . Thrombosis 12/2015   mural  area and no notation of heart attack    Past Surgical History:  Procedure Laterality Date  . APPENDECTOMY    . CATARACT EXTRACTION, BILATERAL    . CYSTOSCOPY WITH INSERTION OF UROLIFT    . EYE SURGERY  08/2018  . KIDNEY STONE SURGERY    . KNEE SURGERY    . PACEMAKER INSERTION N/A 07/13/2018   Procedure: INSERTION PACEMAKER-DUEL CHAMBER INITIAL IMPLANT;  Surgeon: Isaias Cowman, MD;  Location: [facility] ORS;  Service: Cardiovascular;  Laterality: N/A;  . SPINE SURGERY      There were no vitals filed for this visit.  Subjective Assessment - 02/23/20 1116    Subjective  Pt reports doing well today but is feeling a little tired and is moving a little slower  than normal. He had his second COVID shot a couple days ago. Denies any falls. No specific questions upon arrival today.    Pertinent History  Pt is a 86 year old male who presents with imbalance and difficulty with gait following a right frontal CVA with left hemiparesis on 05/22/19.  He has completed inpatient and home health PT, and now presents for OP PT.  He was discharged from inpatient rehab at a supervision level of assistance, ambulating with a RW.  His family notes that he is impulsive and will try to ambulate without his RW at home.  Family is currently providing 24/7 supervision.    Limitations  House hold activities;Walking    How long can you sit comfortably?  no limitations    How long can you walk comfortably?  he requires a RW at home    Patient Stated Goals  get back to normal; want to be able to play golf and go out to play trivia again.  His daughters want him to regain independence with going to the bathroom at night    Currently in Pain?  No/denies            TREATMENT   Neuromuscular Re-education Obstacle course with patient stepping over  2 half-foam rollers, step-up and over both 4" and 6" steps, step over 6" orange hurdles, and step-up and over 2" Airex pad, performed twice; 6" alternatingforward step taps with 4# ankle weights without UE support x 10; AirexNBOS dart board throws x 5 minutes to challenge dynamic balance;   Therapeutic Exercise NuStep L2/3 x 5 minute for warm-up during history (4 minutes unbilled); Seated hip flexion marching with 4# ankle weights x 20 bilateral; Seated clams with manual resistance x 20 bilateral; Seated adductor squeeze with manual resistance x 20 bilateral; SeatedRLAQ with4# ankle weights x 20; Standing heel raises with BUE support x 10;   Pt educated throughout session about proper posture and technique with exercises. Improved exercise technique, movement at target joints, use of target muscles after min to mod  verbal, visual, tactile cues.   Practiced gait again with patient today and worked on navigating obstacles and steps. Continued with seated strengthening to minimize discomfort in standing with L knee.Also continued with dynamic throwing on Airex pad. Pt encouraged to continue his HEP.Hewillcontinue tobenefit from skilled PT services to address deficits in balance and decrease risk for future falls.                       PT Short Term Goals - 02/01/20 0747      PT SHORT TERM GOAL #1   Title  Pt will be independent with HEP in order to improve strength and balance in order to decrease fall risk and improve function at home and work.    Baseline  3/2 HEP compliant per patient report    Time  6    Period  Weeks    Status  On-going    Target Date  01/24/20        PT Long Term Goals - 02/01/20 0748      PT LONG TERM GOAL #1   Title  Pt will improve BERG to >50 in order to decrease his fall risk.    Baseline  09/21/19: 42/56, 11/03/19: 47/56; 12/13/19: 46/56 3/2: 46/56    Time  12    Period  Weeks    Status  On-going    Target Date  03/06/20      PT LONG TERM GOAL #2   Title  Pt will decrease TUG to below 14 seconds/decrease in order to demonstrate decreased fall risk.    Baseline  09/21/19: 22.19s; 11/03/19: 19.0s; 12/13/19: 17.0s 3/2: 14.97    Time  12    Period  Weeks    Status  Partially Met    Target Date  03/06/20      PT LONG TERM GOAL #3   Title  Pt will decrease 5TSTS by at least 3 seconds in order to demonstrate clinically significant improvement in LE strength.    Baseline  09/21/19: 32.28s; 11/03/19: 15.5s; 12/13/19: 14.2s    Time  12    Period  Weeks    Status  Achieved      PT LONG TERM GOAL #4   Title  Pt will increase his 10MWT to >1.0 m/s in order to demonstrate a clinically significant improvement in gait speed and improved community ambulation    Baseline  09/21/19: Self-selected: 20.9 s = 0.48 m/s; Fastest: 17.1s =0.58 m/s; 11/03/19:  Self-selected: 17.7s s = 0.56 m/s; Fastest: 13.5s =0.74 m/s with walker; 12/13/19: Self-selected:  12.6s =  0.79 m/s; Fastest: 12.2 = 0.82 m/s with walker 3/2: 0.94 m/s with walker  Time  12    Period  Weeks    Status  Partially Met    Target Date  03/06/20            Plan - 02/23/20 1117    Clinical Impression Statement  Practiced gait again with patient today and worked on navigating obstacles and steps. Continued with seated strengthening to minimize discomfort in standing with L knee. Also continued with dynamic throwing on Airex pad. Pt encouraged to continue his HEP. He will continue to benefit from skilled PT services to address deficits in balance and decrease risk for future falls.    Personal Factors and Comorbidities  Age;Comorbidity 2    Comorbidities  HTN, CVA    Examination-Activity Limitations  Bend;Lift;Squat;Locomotion Level;Stairs;Stand    Examination-Participation Restrictions  Community Activity;Driving    Rehab Potential  Good    PT Frequency  2x / week    PT Duration  12 weeks    PT Treatment/Interventions  ADLs/Self Care Home Management;Canalith Repostioning;Cryotherapy;Electrical Stimulation;Iontophoresis '4mg'$ /ml Dexamethasone;Moist Heat;Traction;Ultrasound;DME Instruction;Gait training;Stair training;Therapeutic activities;Therapeutic exercise;Functional mobility training;Balance training;Neuromuscular re-education;Patient/family education;Manual techniques;Dry needling;Vestibular;Joint Manipulations;Spinal Manipulations    PT Next Visit Plan  progress strength and balance as well as gait with spc    PT Home Exercise Plan  Access Code: [email or web address]    Consulted and Agree with Plan of Care  Patient       Patient will benefit from skilled therapeutic intervention in order to improve the following deficits and impairments:  Abnormal gait, Decreased balance, Decreased endurance, Decreased mobility, Difficulty walking, Decreased knowledge of precautions, Decreased safety  awareness, Decreased activity tolerance, Decreased strength, Hypermobility  Visit Diagnosis: Muscle weakness (generalized)  Unsteadiness on feet     Problem List Patient Active Problem List   Diagnosis Date Noted  . Chronic venous insufficiency 12/05/2019  . 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 Great toe pain, right   . Lethargy   . CKD (chronic kidney disease), stage II   . Leukocytosis   . Benign essential HTN   . New onset atrial fibrillation ([place])   . Dysphagia, post-stroke   . 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 Acute ischemic right MCA stroke ([place]) 05/25/2019  . Goals of care, counseling/discussion   . Palliative care by specialist   . DNR (do not resuscitate) discussion   . Stroke, acute, embolic ([place]) 81/82/9937  . Persistent proteinuria 07/30/2018  . 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 Chronic kidney disease, stage III (moderate) 06/15/2018  . Leg pain 05/06/2018  . Lymphedema 05/06/2018  . Coagulopathy ([place]) 07/07/2017  . Lumbar spondylosis 03/10/2017  . Elevated uric acid in blood 11/20/2016  . BPH (benign prostatic hyperplasia) 05/29/2016  . Peripheral vascular disease of lower extremity ([place]) 03/03/2016  . Gallstone 12/31/2015  . 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 Atherosclerosis of aorta ([place]) 11/22/2015  . Carotid artery narrowing 11/22/2015  . Diverticulosis of colon 11/22/2015  . Decreased creatinine clearance 11/22/2015  . Arthritis, degenerative 11/22/2015  . Lactose intolerance 11/22/2015  . Basal cell carcinoma 11/22/2015  . Essential hypertension 06/21/2015  . Hyperlipemia 06/21/2015  . GERD (gastroesophageal reflux disease) 06/21/2015  . Calculus of kidney 11/18/2013   Phillips Grout PT, DPT, GCS  Abria Vannostrand 02/23/2020, 3:39 PM  [place] MAIN [facility] SERVICES [address] [place], [place], [address] Phone:  [phone]   Fax:  [phone]  Name: JORDY VERBA MRN:  [id number] Date of Birth: [birth year]

## 2020-02-28 ENCOUNTER — Encounter: Payer: Medicare Other | Admitting: Occupational Therapy

## 2020-02-28 ENCOUNTER — Other Ambulatory Visit: Payer: Self-pay

## 2020-02-28 ENCOUNTER — Ambulatory Visit: Payer: Medicare Other

## 2020-02-28 DIAGNOSIS — R2681 Unsteadiness on feet: Secondary | ICD-10-CM

## 2020-02-28 DIAGNOSIS — M6281 Muscle weakness (generalized): Secondary | ICD-10-CM

## 2020-02-28 NOTE — Therapy (Signed)
[place] MAIN [facility] SERVICES [address] [place], [place], [address] Phone: [phone]   Fax:  [phone]  Physical Therapy Treatment  Patient Details  Name: Jose Castro MRN: [id number] Date of Birth: Mar 21, 1926 Referring Provider (PT): Dr. Letta Pate   Encounter Date: 02/28/2020  PT End of Session - 02/28/20 1030    Visit Number  38    Number of Visits  49    Date for PT Re-Evaluation  03/06/20    PT Start Time  1017    PT Stop Time  1100    PT Time Calculation (min)  43 min    Equipment Utilized During Treatment  Gait belt    Activity Tolerance  Patient tolerated treatment well;Patient limited by fatigue    Behavior During Therapy  [facility] for tasks assessed/performed       Past Medical History:  Diagnosis Date  . Allergy    Penicillin  . Arthritis   . Basal cell carcinoma   . BPH (benign prostatic hyperplasia)   . Chronic kidney disease    had a kidney stone which per family was a cyst that was removed  . Dysrhythmia   . GERD (gastroesophageal reflux disease)   . History of kidney stones   . Hyperlipidemia   . Hypertension   . Left-sided low back pain with left-sided sciatica   . Splenic vein thrombosis   . Stroke ([place]) 05/22/2019  . Thrombosis 12/2015   mural  area and no notation of heart attack    Past Surgical History:  Procedure Laterality Date  . APPENDECTOMY    . CATARACT EXTRACTION, BILATERAL    . CYSTOSCOPY WITH INSERTION OF UROLIFT    . EYE SURGERY  08/2018  . KIDNEY STONE SURGERY    . KNEE SURGERY    . PACEMAKER INSERTION N/A 07/13/2018   Procedure: INSERTION PACEMAKER-DUEL CHAMBER INITIAL IMPLANT;  Surgeon: Isaias Cowman, MD;  Location: [facility] ORS;  Service: Cardiovascular;  Laterality: N/A;  . SPINE SURGERY      There were no vitals filed for this visit.  Subjective Assessment - 02/28/20 1029    Subjective  Pt reports doing alright today. His daughter states that he is very tired this morning. He  is complaining of increased L knee pain after getting off the elevator this morning. Rates the pain as 8/10 upon arrival. Denies any falls. No specific questions upon arrival today.    Pertinent History  Pt is a 86 year old male who presents with imbalance and difficulty with gait following a right frontal CVA with left hemiparesis on 05/22/19.  He has completed inpatient and home health PT, and now presents for OP PT.  He was discharged from inpatient rehab at a supervision level of assistance, ambulating with a RW.  His family notes that he is impulsive and will try to ambulate without his RW at home.  Family is currently providing 24/7 supervision.    Limitations  House hold activities;Walking    How long can you sit comfortably?  no limitations    How long can you walk comfortably?  he requires a RW at home    Patient Stated Goals  get back to normal; want to be able to play golf and go out to play trivia again.  His daughters want him to regain independence with going to the bathroom at night    Currently in Pain?  Yes    Pain Score  8     Pain Location  Knee    Pain Orientation  Left    Pain Descriptors / Indicators  Sore;Aching    Pain Type  Chronic pain    Pain Onset  More than a month ago    Pain Frequency  Intermittent        TREATMENT   Neuromuscular Re-education  Dynamic balance during ambulation withsingle point caneapproximately400'today. Ptperformedhorizontal and vertical head turns with cues from therapist as well as gait speed changes.Practiced opening/closing multiple doors and education provided about switching hands with cane in order to safely open doors without losing balance. Performed 180 degree quick turns. Pt demonstrates improved safety with passing cane between hands when opening doors. He does however struggle to move fast enough to make it fully through doors before they close and therapist has to assist in holding them open.    Therapeutic  Exercise Seated hip flexion marching with5# ankle weightsx 20 bilateral; Seated clams with green tband x 20 bilateral; Seated adductor squeeze with ball x 20 bilateral; SeatedRLAQ with5# ankle weightsx 20; Standing heel raises with BUE support x 20; Standing hip flexion marches with 5# ankle weights x 20 bilateral; Standing hip abduction with 5# ankle weights x 10 RLE (secondary to pain in SLS on LLE), and x 20 LLE:   Pt educated throughout session about proper posture and technique with exercises. Improved exercise technique, movement at target joints, use of target muscles after min to mod verbal, visual, tactile cues.   Practiced gait again with patient today and worked on quick turns, gait speed changes, and 180 degree turns. Also continued to practice opening/closing doors and pt demonstrates improved sequencing today however cannot walk fast enough to make it fully through the door before it closes. Continued seated and standing exercises however somewhat limited today due to L knee pain. Will update outcome measures soon and discuss upcoming discharge. Pt encouraged to continue his HEP.Hewillcontinue tobenefit from skilled PT services to address deficits in balance and decrease risk for future falls.                         PT Short Term Goals - 02/01/20 0747      PT SHORT TERM GOAL #1   Title  Pt will be independent with HEP in order to improve strength and balance in order to decrease fall risk and improve function at home and work.    Baseline  3/2 HEP compliant per patient report    Time  6    Period  Weeks    Status  On-going    Target Date  01/24/20        PT Long Term Goals - 02/01/20 0748      PT LONG TERM GOAL #1   Title  Pt will improve BERG to >50 in order to decrease his fall risk.    Baseline  09/21/19: 42/56, 11/03/19: 47/56; 12/13/19: 46/56 3/2: 46/56    Time  12    Period  Weeks    Status  On-going    Target Date   03/06/20      PT LONG TERM GOAL #2   Title  Pt will decrease TUG to below 14 seconds/decrease in order to demonstrate decreased fall risk.    Baseline  09/21/19: 22.19s; 11/03/19: 19.0s; 12/13/19: 17.0s 3/2: 14.97    Time  12    Period  Weeks    Status  Partially Met    Target Date  03/06/20  PT LONG TERM GOAL #3   Title  Pt will decrease 5TSTS by at least 3 seconds in order to demonstrate clinically significant improvement in LE strength.    Baseline  09/21/19: 32.28s; 11/03/19: 15.5s; 12/13/19: 14.2s    Time  12    Period  Weeks    Status  Achieved      PT LONG TERM GOAL #4   Title  Pt will increase his 10MWT to >1.0 m/s in order to demonstrate a clinically significant improvement in gait speed and improved community ambulation    Baseline  09/21/19: Self-selected: 20.9 s = 0.48 m/s; Fastest: 17.1s =0.58 m/s; 11/03/19: Self-selected: 17.7s s = 0.56 m/s; Fastest: 13.5s =0.74 m/s with walker; 12/13/19: Self-selected:  12.6s =  0.79 m/s; Fastest: 12.2 = 0.82 m/s with walker 3/2: 0.94 m/s with walker    Time  12    Period  Weeks    Status  Partially Met    Target Date  03/06/20            Plan - 02/28/20 1030    Clinical Impression Statement  Practiced gait again with patient today and worked on quick turns, gait speed changes, and 180 degree turns. Also continued to practice opening/closing doors and pt demonstrates improved sequencing today however cannot walk fast enough to make it fully through the door before it closes. Continued seated and standing exercises however somewhat limited today due to L knee pain. Will update outcome measures soon and discuss upcoming discharge. Pt encouraged to continue his HEP. He will continue to benefit from skilled PT services to address deficits in balance and decrease risk for future falls.    Personal Factors and Comorbidities  Age;Comorbidity 2    Comorbidities  HTN, CVA    Examination-Activity Limitations  Bend;Lift;Squat;Locomotion  Level;Stairs;Stand    Examination-Participation Restrictions  Community Activity;Driving    Rehab Potential  Good    PT Frequency  2x / week    PT Duration  12 weeks    PT Treatment/Interventions  ADLs/Self Care Home Management;Canalith Repostioning;Cryotherapy;Electrical Stimulation;Iontophoresis 54m/ml Dexamethasone;Moist Heat;Traction;Ultrasound;DME Instruction;Gait training;Stair training;Therapeutic activities;Therapeutic exercise;Functional mobility training;Balance training;Neuromuscular re-education;Patient/family education;Manual techniques;Dry needling;Vestibular;Joint Manipulations;Spinal Manipulations    PT Next Visit Plan  progress strength and balance as well as gait with spc    PT Home Exercise Plan  Access Code: [email or web address]   Consulted and Agree with Plan of Care  Patient       Patient will benefit from skilled therapeutic intervention in order to improve the following deficits and impairments:  Abnormal gait, Decreased balance, Decreased endurance, Decreased mobility, Difficulty walking, Decreased knowledge of precautions, Decreased safety awareness, Decreased activity tolerance, Decreased strength, Hypermobility  Visit Diagnosis: Muscle weakness (generalized)  Unsteadiness on feet     Problem List Patient Active Problem List   Diagnosis Date Noted  . Chronic venous insufficiency 12/05/2019  . 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 Great toe pain, right   . Lethargy   . CKD (chronic kidney disease), stage II   . Leukocytosis   . Benign essential HTN   . New onset atrial fibrillation ([place]   . Dysphagia, post-stroke   . Right middle cerebral artery stroke ([place] 05/27/2019  . Acute ischemic right MCA stroke ([place] 05/25/2019  . Goals of care, counseling/discussion   . Palliative care by specialist   . DNR (do not resuscitate) discussion   . Stroke, acute, embolic ([place] 020/35/5974 . Persistent proteinuria 07/30/2018  . Mobitz type  2 second degree heart  block 07/13/2018  . Chronic kidney disease, stage III (moderate) 06/15/2018  . Leg pain 05/06/2018  . Lymphedema 05/06/2018  . Coagulopathy ([place]) 07/07/2017  . Lumbar spondylosis 03/10/2017  . Elevated uric acid in blood 11/20/2016  . BPH (benign prostatic hyperplasia) 05/29/2016  . Peripheral vascular disease of lower extremity ([place]) 03/03/2016  . Gallstone 12/31/2015  . 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 Atherosclerosis of aorta ([place]) 11/22/2015  . Carotid artery narrowing 11/22/2015  . Diverticulosis of colon 11/22/2015  . Decreased creatinine clearance 11/22/2015  . Arthritis, degenerative 11/22/2015  . Lactose intolerance 11/22/2015  . Basal cell carcinoma 11/22/2015  . Essential hypertension 06/21/2015  . Hyperlipemia 06/21/2015  . GERD (gastroesophageal reflux disease) 06/21/2015  . Calculus of kidney 11/18/2013   Phillips Grout PT, DPT, GCS  Aeron Donaghey 02/28/2020, 12:30 PM  [place] MAIN [facility] SERVICES [address] [place], [place], [address] Phone: [phone]   Fax:  [phone]  Name: EDUIN FRIEDEL MRN: [id number] Date of Birth: 1926/11/07

## 2020-03-01 ENCOUNTER — Encounter: Payer: Self-pay | Admitting: Physical Therapy

## 2020-03-01 ENCOUNTER — Other Ambulatory Visit: Payer: Self-pay

## 2020-03-01 ENCOUNTER — Ambulatory Visit: Payer: Medicare Other | Attending: Physical Medicine & Rehabilitation | Admitting: Physical Therapy

## 2020-03-01 DIAGNOSIS — R278 Other lack of coordination: Secondary | ICD-10-CM

## 2020-03-01 DIAGNOSIS — M6281 Muscle weakness (generalized): Secondary | ICD-10-CM | POA: Diagnosis not present

## 2020-03-01 DIAGNOSIS — R2681 Unsteadiness on feet: Secondary | ICD-10-CM | POA: Diagnosis present

## 2020-03-01 DIAGNOSIS — R2689 Other abnormalities of gait and mobility: Secondary | ICD-10-CM | POA: Diagnosis present

## 2020-03-01 NOTE — Therapy (Signed)
[place] MAIN [facility] SERVICES [address] [place], [place], [address] Phone: [phone]   Fax:  [phone]  Physical Therapy Treatment  Patient Details  Name: Jose Castro MRN: [id number] Date of Birth: [birth year] Referring Provider (PT): Dr. Letta Pate   Encounter Date: 03/01/2020  PT End of Session - 03/01/20 1050    Visit Number  39    Number of Visits  49    Date for PT Re-Evaluation  03/06/20    PT Start Time  1050    PT Stop Time  1135    PT Time Calculation (min)  45 min    Equipment Utilized During Treatment  Gait belt    Activity Tolerance  Patient tolerated treatment well;Patient limited by fatigue    Behavior During Therapy  [facility] for tasks assessed/performed       Past Medical History:  Diagnosis Date  . Allergy    Penicillin  . Arthritis   . Basal cell carcinoma   . BPH (benign prostatic hyperplasia)   . Chronic kidney disease    had a kidney stone which per family was a cyst that was removed  . Dysrhythmia   . GERD (gastroesophageal reflux disease)   . History of kidney stones   . Hyperlipidemia   . Hypertension   . Left-sided low back pain with left-sided sciatica   . Splenic vein thrombosis   . Stroke ([place]) 05/22/2019  . Thrombosis 12/2015   mural  area and no notation of heart attack    Past Surgical History:  Procedure Laterality Date  . APPENDECTOMY    . CATARACT EXTRACTION, BILATERAL    . CYSTOSCOPY WITH INSERTION OF UROLIFT    . EYE SURGERY  08/2018  . KIDNEY STONE SURGERY    . KNEE SURGERY    . PACEMAKER INSERTION N/A 07/13/2018   Procedure: INSERTION PACEMAKER-DUEL CHAMBER INITIAL IMPLANT;  Surgeon: Isaias Cowman, MD;  Location: [facility] ORS;  Service: Cardiovascular;  Laterality: N/A;  . SPINE SURGERY      There were no vitals filed for this visit.  Subjective Assessment - 03/01/20 1055    Subjective  Patient reports doing well. No new falls. Otherwise new complaints.    Pertinent History   Pt is a 86 year old male who presents with imbalance and difficulty with gait following a right frontal CVA with left hemiparesis on 05/22/19.  He has completed inpatient and home health PT, and now presents for OP PT.  He was discharged from inpatient rehab at a supervision level of assistance, ambulating with a RW.  His family notes that he is impulsive and will try to ambulate without his RW at home.  Family is currently providing 24/7 supervision.    Limitations  House hold activities;Walking    How long can you sit comfortably?  no limitations    How long can you walk comfortably?  he requires a RW at home    Patient Stated Goals  get back to normal; want to be able to play golf and go out to play trivia again.  His daughters want him to regain independence with going to the bathroom at night    Currently in Pain?  No/denies    Pain Onset  More than a month ago    Multiple Pain Sites  No                TREATMENT   Warm up on Nustep, level 2 BUE/BLE x4 min with cues to increase  speed to >50 for cardiovascular challenge;    Neuromuscular Re-education    Weaving around cones #6 with SPC x3 laps with min A for safety and cues to increase step length for better gait safety; Side stepping over cones #5 x1 lap each direction with min A for safety;  Picking up cones #5 with SPC with CGA for safety with good positioning and safety awareness;   Resisted walking 12.5# forward/backward, side/side (2 way) x2 laps each with SPC with min A for safety and cues to slow down eccentric return for better gait safety;  Patient ambulated 150 feet x2 with SPC with cues for erect posture, increase step length and improve erect head for visual cues and safety awareness;  Patient instructed in gait with head turns side/side to challenge dynamic balance. He does exhibit mild veering with lateral head turns requiring min A for safety;  Instructed patient in gait with SPC in gym picking up cones to challenge  spatial awareness and improve gaze stabilization/looking for items #6 cones x2 sets with CGA for safety; He does ambulate at a slower pace with multiple stops when trying to find cones and improve stability;    Pt educated throughout session about proper posture and technique with exercises. Improved exercise technique, movement at target joints, use of target muscles after min to mod verbal, visual, tactile cues.   Tolerated session well.  He does report increased fatigue this session and complains of increased LLE knee discomfort with prolonged ambulation requiring short seated rest breaks; Patient does exhibit increased unsteadiness with dynamic movement requiring CGA to min A with advanced exercise.                      PT Education - 03/01/20 1049    Education Details  exercise technique, balance/gait safety, HEP reinforced;    Person(s) Educated  Patient    Methods  Explanation;Verbal cues    Comprehension  Verbalized understanding;Returned demonstration;Verbal cues required;Need further instruction       PT Short Term Goals - 02/01/20 0747      PT SHORT TERM GOAL #1   Title  Pt will be independent with HEP in order to improve strength and balance in order to decrease fall risk and improve function at home and work.    Baseline  3/2 HEP compliant per patient report    Time  6    Period  Weeks    Status  On-going    Target Date  01/24/20        PT Long Term Goals - 02/01/20 0748      PT LONG TERM GOAL #1   Title  Pt will improve BERG to >50 in order to decrease his fall risk.    Baseline  09/21/19: 42/56, 11/03/19: 47/56; 12/13/19: 46/56 3/2: 46/56    Time  12    Period  Weeks    Status  On-going    Target Date  03/06/20      PT LONG TERM GOAL #2   Title  Pt will decrease TUG to below 14 seconds/decrease in order to demonstrate decreased fall risk.    Baseline  09/21/19: 22.19s; 11/03/19: 19.0s; 12/13/19: 17.0s 3/2: 14.97    Time  12    Period  Weeks     Status  Partially Met    Target Date  03/06/20      PT LONG TERM GOAL #3   Title  Pt will decrease 5TSTS by at least 3  seconds in order to demonstrate clinically significant improvement in LE strength.    Baseline  09/21/19: 32.28s; 11/03/19: 15.5s; 12/13/19: 14.2s    Time  12    Period  Weeks    Status  Achieved      PT LONG TERM GOAL #4   Title  Pt will increase his 10MWT to >1.0 m/s in order to demonstrate a clinically significant improvement in gait speed and improved community ambulation    Baseline  09/21/19: Self-selected: 20.9 s = 0.48 m/s; Fastest: 17.1s =0.58 m/s; 11/03/19: Self-selected: 17.7s s = 0.56 m/s; Fastest: 13.5s =0.74 m/s with walker; 12/13/19: Self-selected:  12.6s =  0.79 m/s; Fastest: 12.2 = 0.82 m/s with walker 3/2: 0.94 m/s with walker    Time  12    Period  Weeks    Status  Partially Met    Target Date  03/06/20            Plan - 03/01/20 1257    Clinical Impression Statement  Patient motivated and participated well within session. Patient instructed in advanced balance activities focusing on dynamic movement. Utilized SPC to challenge gait safety. Patient does require CGA to min A with advanced balance exercise. He does fatigue quickly requiring short seated rest breaks. He would benefit from additional skilled PT intervention to improve strength, balance and gait safety;    Personal Factors and Comorbidities  Age;Comorbidity 2    Comorbidities  HTN, CVA    Examination-Activity Limitations  Bend;Lift;Squat;Locomotion Level;Stairs;Stand    Examination-Participation Restrictions  Community Activity;Driving    Rehab Potential  Good    PT Frequency  2x / week    PT Duration  12 weeks    PT Treatment/Interventions  ADLs/Self Care Home Management;Canalith Repostioning;Cryotherapy;Electrical Stimulation;Iontophoresis 49m/ml Dexamethasone;Moist Heat;Traction;Ultrasound;DME Instruction;Gait training;Stair training;Therapeutic activities;Therapeutic exercise;Functional  mobility training;Balance training;Neuromuscular re-education;Patient/family education;Manual techniques;Dry needling;Vestibular;Joint Manipulations;Spinal Manipulations    PT Next Visit Plan  progress strength and balance as well as gait with spc    PT Home Exercise Plan  Access Code: [email or web address]   Consulted and Agree with Plan of Care  Patient       Patient will benefit from skilled therapeutic intervention in order to improve the following deficits and impairments:  Abnormal gait, Decreased balance, Decreased endurance, Decreased mobility, Difficulty walking, Decreased knowledge of precautions, Decreased safety awareness, Decreased activity tolerance, Decreased strength, Hypermobility  Visit Diagnosis: Muscle weakness (generalized)  Unsteadiness on feet  Other lack of coordination  Other abnormalities of gait and mobility     Problem List Patient Active Problem List   Diagnosis Date Noted  . Chronic venous insufficiency 12/05/2019  . 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 Great toe pain, right   . Lethargy   . CKD (chronic kidney disease), stage II   . Leukocytosis   . Benign essential HTN   . New onset atrial fibrillation ([place]   . Dysphagia, post-stroke   . Right middle cerebral artery stroke ([place] 05/27/2019  . Acute ischemic right MCA stroke ([place] 05/25/2019  . Goals of care, counseling/discussion   . Palliative care by specialist   . DNR (do not resuscitate) discussion   . Stroke, acute, embolic ([place] 086/57/8469 . Persistent proteinuria 07/30/2018  . 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 Chronic kidney disease, stage III (moderate) 06/15/2018  . Leg pain 05/06/2018  . Lymphedema 05/06/2018  . Coagulopathy ([place] 07/07/2017  . Lumbar spondylosis 03/10/2017  . Elevated uric acid in blood  11/20/2016  . BPH (benign prostatic hyperplasia) 05/29/2016  . Peripheral vascular disease of lower extremity ([place]) 03/03/2016  . Gallstone 12/31/2015   . 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 Atherosclerosis of aorta ([place]) 11/22/2015  . Carotid artery narrowing 11/22/2015  . Diverticulosis of colon 11/22/2015  . Decreased creatinine clearance 11/22/2015  . Arthritis, degenerative 11/22/2015  . Lactose intolerance 11/22/2015  . Basal cell carcinoma 11/22/2015  . Essential hypertension 06/21/2015  . Hyperlipemia 06/21/2015  . GERD (gastroesophageal reflux disease) 06/21/2015  . Calculus of kidney 11/18/2013    Latasia Silberstein PT, DPT 03/01/2020, 12:57 PM  [place] MAIN [facility] SERVICES [address] [place], [place], [address] Phone: [phone]   Fax:  [phone]  Name: Jose Castro MRN: [id number] Date of Birth: January 23, 1926

## 2020-03-06 ENCOUNTER — Ambulatory Visit (INDEPENDENT_AMBULATORY_CARE_PROVIDER_SITE_OTHER): Payer: Medicare Other | Admitting: Family Medicine

## 2020-03-06 ENCOUNTER — Encounter: Payer: Self-pay | Admitting: Family Medicine

## 2020-03-06 ENCOUNTER — Ambulatory Visit: Payer: Medicare Other

## 2020-03-06 ENCOUNTER — Other Ambulatory Visit: Payer: Self-pay

## 2020-03-06 VITALS — BP 130/90 | HR 77 | Temp 97.1°F | Resp 14 | Ht 71.0 in | Wt 163.8 lb

## 2020-03-06 VITALS — BP 121/70 | HR 82

## 2020-03-06 DIAGNOSIS — I4891 Unspecified atrial fibrillation: Secondary | ICD-10-CM

## 2020-03-06 DIAGNOSIS — I739 Peripheral vascular disease, unspecified: Secondary | ICD-10-CM

## 2020-03-06 DIAGNOSIS — R2681 Unsteadiness on feet: Secondary | ICD-10-CM

## 2020-03-06 DIAGNOSIS — R739 Hyperglycemia, unspecified: Secondary | ICD-10-CM | POA: Diagnosis not present

## 2020-03-06 DIAGNOSIS — E538 Deficiency of other specified B group vitamins: Secondary | ICD-10-CM

## 2020-03-06 DIAGNOSIS — M6281 Muscle weakness (generalized): Secondary | ICD-10-CM | POA: Diagnosis not present

## 2020-03-06 DIAGNOSIS — N1831 Chronic kidney disease, stage 3a: Secondary | ICD-10-CM | POA: Diagnosis not present

## 2020-03-06 DIAGNOSIS — I10 Essential (primary) hypertension: Secondary | ICD-10-CM

## 2020-03-06 DIAGNOSIS — D692 Other nonthrombocytopenic purpura: Secondary | ICD-10-CM

## 2020-03-06 DIAGNOSIS — E440 Moderate protein-calorie malnutrition: Secondary | ICD-10-CM

## 2020-03-06 DIAGNOSIS — I69354 Hemiplegia and hemiparesis following cerebral infarction affecting left non-dominant side: Secondary | ICD-10-CM

## 2020-03-06 NOTE — Patient Instructions (Addendum)
Access Code: [email or web address] URL: [email or web address] Date: 03/06/2020 Prepared by: Roxana Hires  Exercises Seated Hamstring Stretch - 1 x daily - 7 x weekly - 3 reps - 30s x 3 on each side hold Sit to Stand without Arm Support - 1 x daily - 7 x weekly - 10 reps - 2 sets Seated March with Resistance - 1 x daily - 7 x weekly - 10 reps - 2 sets - 3s hold Seated Hip Abduction with Resistance - 1 x daily - 7 x weekly - 10 reps - 2 sets - 3s hold Seated Hip Adduction Squeeze with Ball - 1 x daily - 7 x weekly - 10 reps - 2 sets - 3s hold Romberg Stance with Eyes Closed - 1 x daily - 7 x weekly - 3 reps - 30s hold Romberg Stance with Head Rotation - 1 x daily - 7 x weekly - 3 reps - 30s hold Standing Romberg to 1/2 Tandem Stance - 1 x daily - 7 x weekly - 3 reps - 30s x 2 with each foot forward hold Single Leg Stance - 1 x daily - 7 x weekly - 3 reps - 30s x 2 on each leg hold

## 2020-03-06 NOTE — Therapy (Signed)
[place] MAIN [facility] SERVICES [address] [place], [place], [address] Phone: [phone]   Fax:  [phone]  Physical Therapy Treatment/Progress Note/Discharge   Dates of reporting period  01/26/20   To 03/06/20  Patient Details  Name: Jose Castro MRN: [id number] Date of Birth: 1926/04/22 Referring Provider (PT): Dr. Letta Pate   Encounter Date: 03/06/2020  PT End of Session - 03/07/20 2014    Visit Number  40    Number of Visits  49    Date for PT Re-Evaluation  03/06/20    PT Start Time  9774    PT Stop Time  1115    PT Time Calculation (min)  40 min    Equipment Utilized During Treatment  Gait belt    Activity Tolerance  Patient tolerated treatment well    Behavior During Therapy  [facility] for tasks assessed/performed       Past Medical History:  Diagnosis Date  . Allergy    Penicillin  . Arthritis   . Basal cell carcinoma   . BPH (benign prostatic hyperplasia)   . Chronic kidney disease    had a kidney stone which per family was a cyst that was removed  . Dysrhythmia   . GERD (gastroesophageal reflux disease)   . History of kidney stones   . Hyperlipidemia   . Hypertension   . Left-sided low back pain with left-sided sciatica   . Splenic vein thrombosis   . Stroke ([place]) 05/22/2019  . Thrombosis 12/2015   mural  area and no notation of heart attack    Past Surgical History:  Procedure Laterality Date  . APPENDECTOMY    . CATARACT EXTRACTION, BILATERAL    . CYSTOSCOPY WITH INSERTION OF UROLIFT    . EYE SURGERY  08/2018  . KIDNEY STONE SURGERY    . KNEE SURGERY    . PACEMAKER INSERTION N/A 07/13/2018   Procedure: INSERTION PACEMAKER-DUEL CHAMBER INITIAL IMPLANT;  Surgeon: Isaias Cowman, MD;  Location: [facility] ORS;  Service: Cardiovascular;  Laterality: N/A;  . SPINE SURGERY      Vitals:   03/06/20 1040  BP: 121/70  Pulse: 82    Subjective Assessment - 03/07/20 2014    Subjective  Patient reports doing well today.  No new falls since last visit. Pt reports L knee pain with activity but not at rest. Son joins patient for his appointment. No specific questions or concerns.    Pertinent History  Pt is a 86 year old male who presents with imbalance and difficulty with gait following a right frontal CVA with left hemiparesis on 05/22/19.  He has completed inpatient and home health PT, and now presents for OP PT.  He was discharged from inpatient rehab at a supervision level of assistance, ambulating with a RW.  His family notes that he is impulsive and will try to ambulate without his RW at home.  Family is currently providing 24/7 supervision.    Limitations  House hold activities;Walking    How long can you sit comfortably?  no limitations    How long can you walk comfortably?  he requires a RW at home    Patient Stated Goals  get back to normal; want to be able to play golf and go out to play trivia again.  His daughters want him to regain independence with going to the bathroom at night    Currently in Pain?  No/denies  TREATMENT   Neuromuscular Re-education  Outcome measures and goals updated with patient including: 5TSTS: 16.1s; BERG: 46/56; TUG: 19.9s with RW  10 MWT: self-selected: 13.6s = 0.74 m/s, fastest: 11.7s = 0.85 m/s with RW ; Extensive review of HEP with both patient and son, pt issued additional therabands in yellow, red, green, and blue. Education about the importance of regular exercise for strength, balance, and cardiovascular health;   Updated outcome measures and goals with patient today. He demonstratesno change or possibly slight worsening of all of his outcome measures today. His 5TSTS is 16.1s which is slightly worse than when it last tested on 01/31/20. His TUG has also dropped from 14.97s to 19.9s. His BERG is unchanged at 46/56 and his 50mgait speed is slightly worse at 0.85 m/s compared to 0.94 m/s when last tested. Overall pt has reached a plateau and has not  demonstrated improvement since the last goal update. He is slightly limited by his left knee pain which increases whenever he is in single leg stance on his L side. He is able to perform some limited ambulation with therapist using a single point cane during therapy but demonstrates occasional instability and slowing especially when performing head turns. His reaction time is also slow and with his BERG score a 46/56 therapist would not recommend use of single point cane unless he has hand held assist/contact guard support by a family member. Pt and son advised that he should continue to use the rolling walker. He also may consider a rollator if it is easier to navigate. Pt was provided with an extensive HEP and new therabands. Son and patient report tLestine Boxhas been walking but not particularly compliant with his home strengthening program. Therapist encouraged consistent performance of home exercise program to both patient and son and they are in agreement. Pt also advised that he can return for additional therapy if he encounters a new issue or if he demonstrates a decline in his function. Pt will be discharged on this date having achieved maximal benefit at this time from formal physical therapy intervention.                      PT Short Term Goals - 03/06/20 1026      PT SHORT TERM GOAL #1   Title  Pt will be independent with HEP in order to improve strength and balance in order to decrease fall risk and improve function at home and work.    Baseline  3/2 HEP compliant per patient report; 03/06/20: pt and son report infrequent completion of formal HEP however pt reports he is getting out to walk occasionally    Time  6    Period  Weeks    Status  On-going    Target Date  --        PT Long Term Goals - 03/06/20 1026      PT LONG TERM GOAL #1   Title  Pt will improve BERG to >50 in order to decrease his fall risk.    Baseline  09/21/19: 42/56, 11/03/19: 47/56; 12/13/19:: 74/08 3/2: 46/56; 03/06/20: 46/56    Time  12    Period  Weeks    Status  Partially Met      PT LONG TERM GOAL #2   Title  Pt will decrease TUG to below 14 seconds/decrease in order to demonstrate decreased fall risk.    Baseline  09/21/19: 22.19s; 11/03/19: 19.0s; 12/13/19: 17.0s 3/2: 14.97; 03/06/20:  19.9s    Time  12    Period  Weeks    Status  Partially Met      PT LONG TERM GOAL #3   Title  Pt will decrease 5TSTS by at least 3 seconds in order to demonstrate clinically significant improvement in LE strength.    Baseline  09/21/19: 32.28s; 11/03/19: 15.5s; 12/13/19: 14.2s; 03/06/20: 16.1s    Time  12    Period  Weeks    Status  Achieved      PT LONG TERM GOAL #4   Title  Pt will increase his 10MWT to >1.0 m/s in order to demonstrate a clinically significant improvement in gait speed and improved community ambulation    Baseline  09/21/19: Self-selected: 20.9 s = 0.48 m/s; Fastest: 17.1s =0.58 m/s; 11/03/19: Self-selected: 17.7s s = 0.56 m/s; Fastest: 13.5s =0.74 m/s with walker; 12/13/19: Self-selected:  12.6s =  0.79 m/s; Fastest: 12.2 = 0.82 m/s with walker 3/2: 0.94 m/s with walker; 03/06/20: self-selected: 13.6s = 0.74 m/s, fastest: 11.7s = 0.85 m/s with RW    Time  12    Period  Weeks    Status  Partially Met            Plan - 03/07/20 2015    Clinical Impression Statement  Updated outcome measures and goals with patient today. He demonstrates no change or possibly slight worsening of all of his outcome measures today. His 5TSTS is 16.1s which is slightly worse than when it last tested on 01/31/20. His TUG has also dropped from 14.97s to 19.9s. His BERG is unchanged at 46/56 and his 20mgait speed is slightly worse at 0.85 m/s compared to 0.94 m/s when last tested. Overall pt has reached a plateau and has not demonstrated improvement since the last goal update. He is slightly limited by his left knee pain which increases whenever he is in single leg stance on his L side. He is able to perform  some limited ambulation with therapist using a single point cane during therapy but demonstrates occasional instability and slowing especially when performing head turns. His reaction time is also slow and with his BERG score a 46/56 therapist would not recommend use of single point cane unless he has hand held assist/contact guard support by a family member. Pt and son advised that he should continue to use the rolling walker. He also may consider a rollator if it is easier to navigate. Pt was provided with an extensive HEP and new therabands. Son and patient report tLestine Boxhas been walking but not particularly compliant with his home strengthening program. Therapist encouraged consistent performance of home exercise program to both patient and son and they are in agreement. Pt also advised that he can return for additional therapy if he encounters a new issue or if he demonstrates a decline in his function. Pt will be discharged on this date having achieved maximal benefit at this time from formal physical therapy intervention.    Personal Factors and Comorbidities  Age;Comorbidity 2    Comorbidities  HTN, CVA    Examination-Activity Limitations  Bend;Lift;Squat;Locomotion Level;Stairs;Stand    Examination-Participation Restrictions  Community Activity;Driving    Rehab Potential  Good    PT Frequency  2x / week    PT Duration  12 weeks    PT Treatment/Interventions  ADLs/Self Care Home Management;Canalith Repostioning;Cryotherapy;Electrical Stimulation;Iontophoresis 478mml Dexamethasone;Moist Heat;Traction;Ultrasound;DME Instruction;Gait training;Stair training;Therapeutic activities;Therapeutic exercise;Functional mobility training;Balance training;Neuromuscular re-education;Patient/family education;Manual techniques;Dry needling;Vestibular;Joint Manipulations;Spinal Manipulations    PT  Next Visit Plan  Discharge    PT Home Exercise Plan  Access Code: [email or web address]    Consulted and Agree with Plan of Care   Patient       Patient will benefit from skilled therapeutic intervention in order to improve the following deficits and impairments:  Abnormal gait, Decreased balance, Decreased endurance, Decreased mobility, Difficulty walking, Decreased knowledge of precautions, Decreased safety awareness, Decreased activity tolerance, Decreased strength, Hypermobility  Visit Diagnosis: Muscle weakness (generalized)  Unsteadiness on feet     Problem List Patient Active Problem List   Diagnosis Date Noted  . Chronic venous insufficiency 12/05/2019  . 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 CKD (chronic kidney disease), stage II   . New onset atrial fibrillation ([place])   . Right middle cerebral artery stroke ([place]) 05/27/2019  . Goals of care, counseling/discussion   . Palliative care by specialist   . DNR (do not resuscitate) discussion   . History of CVA (cerebrovascular accident) 05/22/2019  . Persistent proteinuria 07/30/2018  . 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 Chronic kidney disease, stage III (moderate) 06/15/2018  . Leg pain 05/06/2018  . Lymphedema 05/06/2018  . Coagulopathy ([place]) 07/07/2017  . Lumbar spondylosis 03/10/2017  . Elevated uric acid in blood 11/20/2016  . BPH (benign prostatic hyperplasia) 05/29/2016  . Peripheral vascular disease of lower extremity ([place]) 03/03/2016  . Gallstone 12/31/2015  . 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 Atherosclerosis of aorta ([place]) 11/22/2015  . Carotid artery narrowing 11/22/2015  . Diverticulosis of colon 11/22/2015  . Decreased creatinine clearance 11/22/2015  . Arthritis, degenerative 11/22/2015  . Lactose intolerance 11/22/2015  . Basal cell carcinoma 11/22/2015  . Essential hypertension 06/21/2015  . Hyperlipemia 06/21/2015  . GERD (gastroesophageal reflux disease) 06/21/2015  . Calculus of kidney 11/18/2013     Phillips Grout PT, DPT, GCS  Mirabel Ahlgren 03/07/2020,  8:17 PM  [place] MAIN [facility] SERVICES [address] [place], [place], [address] Phone: [phone]   Fax:  [phone]  Name: DAIEL STROHECKER MRN: [id number] Date of Birth: 1926-05-12

## 2020-03-06 NOTE — Progress Notes (Signed)
Name: Jose Castro   MRN: [id number]    DOB: 1926/04/30   Date:03/06/2020       Progress Note  Subjective  Chief Complaint  Chief Complaint  Patient presents with  . Medication Refill  . Hypertension    Denies any symptoms  . Hyperlipidemia  . Gastroesophageal Reflux  . 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 He has been taking Eliquis given by Dr. Clayborn Bigness, he completed rehab for left side weakness but is still having some lower extremity weakness and uses a walker to assist with gait. Son states his short term memory has decreased since stroke but long term is fine. The family has cameras in his house and recently they started to let him stay along for a couple of hours during the day. Usually one of his kids stays with him, take him to doctors visits and makes sure he is eating and taking his medications. He does not spend the night alone.   Hyperlipidemia: we will recheck labs, he has been taking Crestor and denies side effects  GERD: he is on PPI and symptoms have been controlled  Malnutrition: he has lost 30 lbs since stroke, he looks frail and is weak, we discussed protein shakes between meals  CKI : based on labs done last year, we will recheck labs, he has good urine output  Senile purpura : on left hand, gave him reassurance  PAD; he is wearing compression stocking hoses, seen by vascular surgeon this year, on statin and eliquis, denies claudication but not walking much secondary to stroke  Patient Active Problem List   Diagnosis Date Noted  . Chronic venous insufficiency 12/05/2019  . 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 CKD (chronic kidney disease), stage II   . New onset atrial fibrillation ([place])   . 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 Goals of care, counseling/discussion   . Palliative care by specialist   . DNR (do not resuscitate) discussion   . History of CVA  (cerebrovascular accident) 05/22/2019  . Persistent proteinuria 07/30/2018  . 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 Chronic kidney disease, stage III (moderate) 06/15/2018  . Leg pain 05/06/2018  . Lymphedema 05/06/2018  . Coagulopathy ([place]) 07/07/2017  . Lumbar spondylosis 03/10/2017  . Elevated uric acid in blood 11/20/2016  . BPH (benign prostatic hyperplasia) 05/29/2016  . Peripheral vascular disease of lower extremity ([place]) 03/03/2016  . Gallstone 12/31/2015  . 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 Atherosclerosis of aorta ([place]) 11/22/2015  . Carotid artery narrowing 11/22/2015  . Diverticulosis of colon 11/22/2015  . Decreased creatinine clearance 11/22/2015  . Arthritis, degenerative 11/22/2015  . Lactose intolerance 11/22/2015  . Basal cell carcinoma 11/22/2015  . Essential hypertension 06/21/2015  . Hyperlipemia 06/21/2015  . GERD (gastroesophageal reflux disease) 06/21/2015  . Calculus of kidney 11/18/2013    Past Surgical History:  Procedure Laterality Date  . APPENDECTOMY    . CATARACT EXTRACTION, BILATERAL    . CYSTOSCOPY WITH INSERTION OF UROLIFT    . EYE SURGERY  08/2018  . KIDNEY STONE SURGERY    . KNEE SURGERY    . PACEMAKER INSERTION N/A 07/13/2018   Procedure: INSERTION PACEMAKER-DUEL CHAMBER INITIAL IMPLANT;  Surgeon: Isaias Cowman, MD;  Location: [facility] ORS;  Service: Cardiovascular;  Laterality: N/A;  . SPINE SURGERY      Family  History  Problem Relation Age of Onset  . Cancer Brother        bladder cancer with mets  . Heart disease Mother   . Aortic aneurysm Mother   . Heart attack Maternal Aunt   . Heart attack Maternal Uncle     Social History   Tobacco Use  . Smoking status: Former Smoker    Packs/day: 0.00    Years: 16.00    Pack years: 0.00    Types: Cigarettes    Start date: 10/21/1944    Quit date: 10/21/1960    Years since quitting: 59.4  . Smokeless tobacco: Never Used   Substance Use Topics  . Alcohol use: Yes    Alcohol/week: 3.0 standard drinks    Types: 3 Standard drinks or equivalent per week    Comment: once a week when he goes out to dinner-socially     Current Outpatient Medications:  .  acetaminophen (TYLENOL) 325 MG tablet, Take 2 tablets (650 mg total) by mouth every 4 (four) hours as needed for mild pain (or temp > 37.5 C (99.5 F)). (Patient taking differently: Take 650 mg by mouth as needed for mild pain (or temp > 37.5 C (99.5 F)). ), Disp:  , Rfl:  .  apixaban (ELIQUIS) 2.5 MG TABS tablet, Take 1 tablet (2.5 mg total) by mouth 2 (two) times daily., Disp: 60 tablet, Rfl: 0 .  Cholecalciferol (VITAMIN D3) 50 MCG (2000 UT) TABS, Take 2,000 Units by mouth daily., Disp: 30 tablet, Rfl: 0 .  finasteride (PROSCAR) 5 MG tablet, Take 1 tablet (5 mg total) by mouth daily., Disp: 30 tablet, Rfl: 0 .  metoprolol succinate (TOPROL XL) 25 MG 24 hr tablet, Take 1 tablet (25 mg total) by mouth daily., Disp: 30 tablet, Rfl: 0 .  Omega-3 Fatty Acids (FISH OIL) 1000 MG CAPS, Take 1,000 mg by mouth daily. , Disp: , Rfl:  .  pantoprazole (PROTONIX) 40 MG tablet, Take 1 tablet (40 mg total) by mouth daily., Disp: 90 tablet, Rfl: 1 .  rosuvastatin (CRESTOR) 40 MG tablet, Take 1 tablet (40 mg total) by mouth daily at 6 PM., Disp: 30 tablet, Rfl: 0  Allergies  Allergen Reactions  . Penicillins Itching and Swelling    Has patient had a PCN reaction causing immediate rash, facial/tongue/throat swelling, SOB or lightheadedness with hypotension: No Has patient had a PCN reaction causing severe rash involving mucus membranes or skin necrosis: No Has patient had a PCN reaction that required hospitalization: No Has patient had a PCN reaction occurring within the last 10 years: No If all of the above answers are "NO", then may proceed with Cephalosporin use.     I personally reviewed active problem list, medication list, allergies, family history, social history, health  maintenance with the patient/caregiver today.   ROS  Constitutional: Negative for fever, positive for weight change.  Respiratory: Negative for cough and shortness of breath.   Cardiovascular: Negative for chest pain or palpitations.  Gastrointestinal: Negative for abdominal pain, no bowel changes.  Musculoskeletal: Positive for gait problem but no  joint swelling.  Skin: Negative for rash.  Neurological: Negative for dizziness or headache.  No other specific complaints in a complete review of systems (except as listed in HPI above).  Objective  Vitals:   03/06/20 1529  BP: 130/90  Pulse: 77  Resp: 14  Temp: (!) 97.1 F (36.2 C)  TempSrc: Temporal  SpO2: 95%  Weight: 163 lb 12.8 oz (74.3 kg)  Height:  5\' 11"  (1.803 m)    Body mass index is 22.85 kg/m.  Physical Exam  Constitutional: Patient appears well-developed and frail No distress.  HEENT: head atraumatic, normocephalic, pupils equal and reactive to light Cardiovascular: Normal rate, regular rhythm and normal heart sounds.  No murmur heard. Trace  BLE edema. Pulmonary/Chest: Effort normal and breath sounds normal. No respiratory distress. Abdominal: Soft.  There is no tenderness. Neurological: good grip, patient still feels weaker on left side, using walker Psychiatric: Patient has a normal mood and affect. behavior is normal. Judgment and thought content normal.   PHQ2/9: Depression screen [facility] 2/9 03/06/2020 02/07/2020 06/22/2019 02/01/2019 07/30/2018  Decreased Interest 0 0 0 0 0  Down, Depressed, Hopeless 0 0 0 0 0  PHQ - 2 Score 0 0 0 0 0  Altered sleeping 0 - 1 - 0  Tired, decreased energy 0 - 0 - 0  Change in appetite 0 - 0 - 0  Feeling bad or failure about yourself  0 - 0 - 0  Trouble concentrating 0 - 0 - 0  Moving slowly or fidgety/restless 0 - 0 - 0  Suicidal thoughts 0 - 0 - 0  PHQ-9 Score 0 - 1 - -  Difficult doing work/chores Not difficult at all - Not difficult at all - Not difficult at all  Some  recent data might be hidden    phq 9 is negative   Fall Risk: Fall Risk  03/06/2020 02/07/2020 01/17/2020 11/08/2019 07/28/2019  Falls in the past year? 0 0 0 0 0  Number falls in past yr: 0 0 - - -  Injury with Fall? 0 0 - - -  Risk for fall due to : - Impaired balance/gait;Impaired mobility - - -  Follow up - Falls prevention discussed - - -     Functional Status Survey: Is the patient deaf or have difficulty hearing?: Yes(wears hearing aids) Does the patient have difficulty seeing, even when wearing glasses/contacts?: No Does the patient have difficulty concentrating, remembering, or making decisions?: No Does the patient have difficulty walking or climbing stairs?: Yes(walker) Does the patient have difficulty dressing or bathing?: No Does the patient have difficulty doing errands alone such as visiting a doctor's office or shopping?: Yes    Assessment & Plan  1. Stage 3a chronic kidney disease  - COMPLETE METABOLIC PANEL WITH GFR  2. Essential hypertension  - CBC with Differential/Platelet - COMPLETE METABOLIC PANEL WITH GFR  3. Moderate protein-calorie malnutrition ([place])  Recheck labs   4. Hyperglycemia  - Hemoglobin A1c  5. Peripheral vascular disease of lower extremity ([facility])  - Lipid panel  6. Low serum vitamin B12  - Vitamin B12.   7. Hemiparesis affecting left side as late effect of cerebrovascular accident (CVA) ([place])  Stable   8. Senile purpura ([facility])  Stable    9. Atrial fibrillation, controlled ([place])  Rate controlled

## 2020-03-07 LAB — CBC WITH DIFFERENTIAL/PLATELET
Absolute Monocytes: 1013 cells/uL — ABNORMAL HIGH (ref 200–950)
Basophils Absolute: 100 cells/uL (ref 0–200)
Basophils Relative: 1.2 %
Eosinophils Absolute: 307 cells/uL (ref 15–500)
Eosinophils Relative: 3.7 %
HCT: 47.4 % (ref 38.5–50.0)
Hemoglobin: 15.9 g/dL (ref 13.2–17.1)
Lymphs Abs: 2855 cells/uL (ref 850–3900)
MCH: 30.6 pg (ref 27.0–33.0)
MCHC: 33.5 g/dL (ref 32.0–36.0)
MCV: 91.3 fL (ref 80.0–100.0)
MPV: 11.3 fL (ref 7.5–12.5)
Monocytes Relative: 12.2 %
Neutro Abs: 4026 cells/uL (ref 1500–7800)
Neutrophils Relative %: 48.5 %
Platelets: 302 10*3/uL (ref 140–400)
RBC: 5.19 10*6/uL (ref 4.20–5.80)
RDW: 12.3 % (ref 11.0–15.0)
Total Lymphocyte: 34.4 %
WBC: 8.3 10*3/uL (ref 3.8–10.8)

## 2020-03-07 LAB — COMPLETE METABOLIC PANEL WITH GFR
AG Ratio: 1.8 (calc) (ref 1.0–2.5)
ALT: 20 U/L (ref 9–46)
AST: 17 U/L (ref 10–35)
Albumin: 3.9 g/dL (ref 3.6–5.1)
Alkaline phosphatase (APISO): 65 U/L (ref 35–144)
BUN/Creatinine Ratio: 18 (calc) (ref 6–22)
BUN: 23 mg/dL (ref 7–25)
CO2: 30 mmol/L (ref 20–32)
Calcium: 9.9 mg/dL (ref 8.6–10.3)
Chloride: 103 mmol/L (ref 98–110)
Creat: 1.27 mg/dL — ABNORMAL HIGH (ref 0.70–1.11)
GFR, Est African American: 57 mL/min/{1.73_m2} — ABNORMAL LOW (ref >=60)
GFR, Est Non African American: 49 mL/min/{1.73_m2} — ABNORMAL LOW (ref >=60)
Globulin: 2.2 g/dL (calc) (ref 1.9–3.7)
Glucose, Bld: 107 mg/dL — ABNORMAL HIGH (ref 65–99)
Potassium: 4.6 mmol/L (ref 3.5–5.3)
Sodium: 140 mmol/L (ref 135–146)
Total Bilirubin: 0.7 mg/dL (ref 0.2–1.2)
Total Protein: 6.1 g/dL (ref 6.1–8.1)

## 2020-03-07 LAB — LIPID PANEL
Cholesterol: 109 mg/dL (ref <200)
HDL: 35 mg/dL — ABNORMAL LOW (ref >=40)
LDL Cholesterol (Calc): 56 mg/dL (calc)
Non-HDL Cholesterol (Calc): 74 mg/dL (calc) (ref <130)
Total CHOL/HDL Ratio: 3.1 (calc) (ref <5.0)
Triglycerides: 93 mg/dL (ref <150)

## 2020-03-07 LAB — VITAMIN B12: Vitamin B-12: 351 pg/mL (ref 200–1100)

## 2020-03-07 LAB — HEMOGLOBIN A1C
Hgb A1c MFr Bld: 5.8 % of total Hgb — ABNORMAL HIGH (ref <5.7)
Mean Plasma Glucose: 120 (calc)
eAG (mmol/L): 6.6 (calc)

## 2020-03-08 ENCOUNTER — Ambulatory Visit: Payer: Medicare Other | Admitting: Physical Therapy

## 2020-03-13 ENCOUNTER — Ambulatory Visit: Payer: Medicare Other

## 2020-03-15 ENCOUNTER — Ambulatory Visit: Payer: Medicare Other

## 2020-03-20 ENCOUNTER — Ambulatory Visit: Payer: Medicare Other

## 2020-03-22 ENCOUNTER — Ambulatory Visit: Payer: Medicare Other

## 2020-03-27 ENCOUNTER — Ambulatory Visit: Payer: Medicare Other

## 2020-03-29 ENCOUNTER — Ambulatory Visit: Payer: Medicare Other

## 2020-04-03 ENCOUNTER — Ambulatory Visit: Payer: Medicare Other

## 2020-04-05 ENCOUNTER — Ambulatory Visit: Payer: Medicare Other

## 2020-04-10 ENCOUNTER — Ambulatory Visit: Payer: Medicare Other

## 2020-04-12 ENCOUNTER — Ambulatory Visit: Payer: Medicare Other

## 2020-04-17 ENCOUNTER — Ambulatory Visit: Payer: Medicare Other

## 2020-04-19 ENCOUNTER — Ambulatory Visit: Payer: Medicare Other

## 2020-04-24 ENCOUNTER — Ambulatory Visit: Payer: Medicare Other

## 2020-04-26 ENCOUNTER — Ambulatory Visit: Payer: Medicare Other

## 2020-05-01 ENCOUNTER — Other Ambulatory Visit: Payer: Self-pay

## 2020-05-01 ENCOUNTER — Ambulatory Visit: Payer: Medicare Other

## 2020-05-01 ENCOUNTER — Emergency Department
Admission: EM | Admit: 2020-05-01 | Discharge: 2020-05-01 | Disposition: A | Discharge status: Home / Self Care | Payer: Medicare Other | Source: Home / Self Care

## 2020-05-01 ENCOUNTER — Emergency Department: Payer: Medicare Other

## 2020-05-01 DIAGNOSIS — R109 Unspecified abdominal pain: Secondary | ICD-10-CM | POA: Diagnosis not present

## 2020-05-01 DIAGNOSIS — A419 Sepsis, unspecified organism: Secondary | ICD-10-CM | POA: Diagnosis not present

## 2020-05-01 DIAGNOSIS — R0789 Other chest pain: Secondary | ICD-10-CM | POA: Insufficient documentation

## 2020-05-01 DIAGNOSIS — Z5321 Procedure and treatment not carried out due to patient leaving prior to being seen by health care provider: Secondary | ICD-10-CM | POA: Insufficient documentation

## 2020-05-01 LAB — TROPONIN I (HIGH SENSITIVITY)
Troponin I (High Sensitivity): 16 ng/L (ref <18)
Troponin I (High Sensitivity): 16 ng/L (ref <18)

## 2020-05-01 LAB — CBC
HCT: 46.7 % (ref 39.0–52.0)
Hemoglobin: 15.8 g/dL (ref 13.0–17.0)
MCH: 31.2 pg (ref 26.0–34.0)
MCHC: 33.8 g/dL (ref 30.0–36.0)
MCV: 92.3 fL (ref 80.0–100.0)
Platelets: 287 10*3/uL (ref 150–400)
RBC: 5.06 MIL/uL (ref 4.22–5.81)
RDW: 13.6 % (ref 11.5–15.5)
WBC: 11.4 10*3/uL — ABNORMAL HIGH (ref 4.0–10.5)
nRBC: 0 % (ref 0.0–0.2)

## 2020-05-01 LAB — BASIC METABOLIC PANEL
Anion gap: 9 (ref 5–15)
BUN: 18 mg/dL (ref 8–23)
CO2: 26 mmol/L (ref 22–32)
Calcium: 9 mg/dL (ref 8.9–10.3)
Chloride: 108 mmol/L (ref 98–111)
Creatinine, Ser: 1.11 mg/dL (ref 0.61–1.24)
GFR calc Af Amer: >60 mL/min (ref >60)
GFR calc non Af Amer: 58 mL/min — ABNORMAL LOW (ref >60)
Glucose, Bld: 92 mg/dL (ref 70–99)
Potassium: 3.5 mmol/L (ref 3.5–5.1)
Sodium: 143 mmol/L (ref 135–145)

## 2020-05-01 LAB — LIPASE, BLOOD: Lipase: 46 U/L (ref 11–51)

## 2020-05-01 NOTE — ED Triage Notes (Signed)
Pt arrives to ER c/o central CP and states now it has moved to abd. States nausea. Denies vomiting or diarrhea. Denies SOB or cough. Pacemaker present. States symptoms began an hour ago. No appendix.

## 2020-05-01 NOTE — ED Provider Notes (Signed)
MSE was initiated and I personally evaluated the patient and placed orders (if any) at  4:58 PM on May 01, 2020.   CC: chest & abdominal pain  S: presents to the ED for evaluation of chest and abdominal pain, with onset 1 hour PTA. He denies SOB, cough, vomiting, or diarrhea. Reports normal bowel movement yesterday, and vomiting x 2 and flatulence today. Also notes epigastric abdominal pain. Denies urinary symptoms.   O: A&O NAD Lungs: CTA CVS: RRR. Normal distal pulses ABD: soft, mildly tender over the epigastrium. Normal bowel sounds. No rebound, guarding, or rigidity.   A/P: chest & abdominal pain Labs and imaging are pending at this time.   The patient appears stable so that the remainder of the MSE may be completed by another provider.   Melvenia Needles, PA-C 05/01/20 1706    Earleen Newport, MD 05/01/20 2111

## 2020-05-02 ENCOUNTER — Ambulatory Visit
Admission: RE | Admit: 2020-05-02 | Discharge: 2020-05-02 | Disposition: A | Discharge status: Home / Self Care | Payer: Medicare Other | Source: Ambulatory Visit | Attending: Family Medicine | Admitting: Family Medicine

## 2020-05-02 ENCOUNTER — Encounter: Payer: Self-pay | Admitting: Family Medicine

## 2020-05-02 ENCOUNTER — Ambulatory Visit (INDEPENDENT_AMBULATORY_CARE_PROVIDER_SITE_OTHER): Payer: Medicare Other | Admitting: Family Medicine

## 2020-05-02 ENCOUNTER — Inpatient Hospital Stay
Admission: AD | Admit: 2020-05-02 | Discharge: 2020-05-14 | DRG: 872 | Disposition: A | Discharge status: Skilled Nursing Facility | Payer: Medicare Other | Source: Ambulatory Visit | Attending: Hospitalist | Admitting: Hospitalist

## 2020-05-02 VITALS — BP 124/66 | HR 64 | Temp 98.3°F | Resp 24 | Wt 165.0 lb

## 2020-05-02 DIAGNOSIS — K760 Fatty (change of) liver, not elsewhere classified: Secondary | ICD-10-CM | POA: Diagnosis present

## 2020-05-02 DIAGNOSIS — R7989 Other specified abnormal findings of blood chemistry: Secondary | ICD-10-CM | POA: Diagnosis present

## 2020-05-02 DIAGNOSIS — R1012 Left upper quadrant pain: Secondary | ICD-10-CM | POA: Insufficient documentation

## 2020-05-02 DIAGNOSIS — Z880 Allergy status to penicillin: Secondary | ICD-10-CM | POA: Diagnosis not present

## 2020-05-02 DIAGNOSIS — K828 Other specified diseases of gallbladder: Secondary | ICD-10-CM | POA: Diagnosis present

## 2020-05-02 DIAGNOSIS — F17210 Nicotine dependence, cigarettes, uncomplicated: Secondary | ICD-10-CM | POA: Diagnosis present

## 2020-05-02 DIAGNOSIS — Z8249 Family history of ischemic heart disease and other diseases of the circulatory system: Secondary | ICD-10-CM

## 2020-05-02 DIAGNOSIS — N1831 Chronic kidney disease, stage 3a: Secondary | ICD-10-CM | POA: Diagnosis present

## 2020-05-02 DIAGNOSIS — I700 Atherosclerosis of aorta: Secondary | ICD-10-CM | POA: Diagnosis present

## 2020-05-02 DIAGNOSIS — N401 Enlarged prostate with lower urinary tract symptoms: Secondary | ICD-10-CM | POA: Diagnosis present

## 2020-05-02 DIAGNOSIS — K567 Ileus, unspecified: Secondary | ICD-10-CM

## 2020-05-02 DIAGNOSIS — K8040 Calculus of bile duct with cholecystitis, unspecified, without obstruction: Secondary | ICD-10-CM | POA: Diagnosis not present

## 2020-05-02 DIAGNOSIS — Z8673 Personal history of transient ischemic attack (TIA), and cerebral infarction without residual deficits: Secondary | ICD-10-CM | POA: Diagnosis not present

## 2020-05-02 DIAGNOSIS — K8042 Calculus of bile duct with acute cholecystitis without obstruction: Secondary | ICD-10-CM | POA: Diagnosis not present

## 2020-05-02 DIAGNOSIS — E785 Hyperlipidemia, unspecified: Secondary | ICD-10-CM | POA: Diagnosis present

## 2020-05-02 DIAGNOSIS — I482 Chronic atrial fibrillation, unspecified: Secondary | ICD-10-CM | POA: Diagnosis not present

## 2020-05-02 DIAGNOSIS — R112 Nausea with vomiting, unspecified: Secondary | ICD-10-CM

## 2020-05-02 DIAGNOSIS — E876 Hypokalemia: Secondary | ICD-10-CM | POA: Diagnosis present

## 2020-05-02 DIAGNOSIS — K219 Gastro-esophageal reflux disease without esophagitis: Secondary | ICD-10-CM | POA: Diagnosis present

## 2020-05-02 DIAGNOSIS — Z950 Presence of cardiac pacemaker: Secondary | ICD-10-CM

## 2020-05-02 DIAGNOSIS — R932 Abnormal findings on diagnostic imaging of liver and biliary tract: Secondary | ICD-10-CM

## 2020-05-02 DIAGNOSIS — N179 Acute kidney failure, unspecified: Secondary | ICD-10-CM

## 2020-05-02 DIAGNOSIS — Z85828 Personal history of other malignant neoplasm of skin: Secondary | ICD-10-CM

## 2020-05-02 DIAGNOSIS — R1013 Epigastric pain: Secondary | ICD-10-CM | POA: Insufficient documentation

## 2020-05-02 DIAGNOSIS — Z4659 Encounter for fitting and adjustment of other gastrointestinal appliance and device: Secondary | ICD-10-CM

## 2020-05-02 DIAGNOSIS — R109 Unspecified abdominal pain: Secondary | ICD-10-CM | POA: Diagnosis present

## 2020-05-02 DIAGNOSIS — D72829 Elevated white blood cell count, unspecified: Secondary | ICD-10-CM

## 2020-05-02 DIAGNOSIS — Z87891 Personal history of nicotine dependence: Secondary | ICD-10-CM | POA: Diagnosis not present

## 2020-05-02 DIAGNOSIS — N400 Enlarged prostate without lower urinary tract symptoms: Secondary | ICD-10-CM

## 2020-05-02 DIAGNOSIS — N3289 Other specified disorders of bladder: Secondary | ICD-10-CM | POA: Diagnosis present

## 2020-05-02 DIAGNOSIS — A419 Sepsis, unspecified organism: Principal | ICD-10-CM | POA: Diagnosis present

## 2020-05-02 DIAGNOSIS — R1011 Right upper quadrant pain: Secondary | ICD-10-CM

## 2020-05-02 DIAGNOSIS — Z20822 Contact with and (suspected) exposure to covid-19: Secondary | ICD-10-CM | POA: Diagnosis present

## 2020-05-02 DIAGNOSIS — K810 Acute cholecystitis: Secondary | ICD-10-CM | POA: Diagnosis present

## 2020-05-02 DIAGNOSIS — I129 Hypertensive chronic kidney disease with stage 1 through stage 4 chronic kidney disease, or unspecified chronic kidney disease: Secondary | ICD-10-CM | POA: Diagnosis present

## 2020-05-02 DIAGNOSIS — M1990 Unspecified osteoarthritis, unspecified site: Secondary | ICD-10-CM | POA: Diagnosis present

## 2020-05-02 DIAGNOSIS — Z7901 Long term (current) use of anticoagulants: Secondary | ICD-10-CM

## 2020-05-02 DIAGNOSIS — R17 Unspecified jaundice: Secondary | ICD-10-CM

## 2020-05-02 DIAGNOSIS — R609 Edema, unspecified: Secondary | ICD-10-CM

## 2020-05-02 DIAGNOSIS — R338 Other retention of urine: Secondary | ICD-10-CM | POA: Diagnosis present

## 2020-05-02 DIAGNOSIS — K8000 Calculus of gallbladder with acute cholecystitis without obstruction: Secondary | ICD-10-CM | POA: Diagnosis not present

## 2020-05-02 DIAGNOSIS — K8020 Calculus of gallbladder without cholecystitis without obstruction: Secondary | ICD-10-CM

## 2020-05-02 DIAGNOSIS — Z79899 Other long term (current) drug therapy: Secondary | ICD-10-CM

## 2020-05-02 DIAGNOSIS — K819 Cholecystitis, unspecified: Secondary | ICD-10-CM

## 2020-05-02 DIAGNOSIS — Z881 Allergy status to other antibiotic agents status: Secondary | ICD-10-CM

## 2020-05-02 DIAGNOSIS — B179 Acute viral hepatitis, unspecified: Secondary | ICD-10-CM | POA: Diagnosis not present

## 2020-05-02 LAB — COMPREHENSIVE METABOLIC PANEL
ALT: 690 U/L — ABNORMAL HIGH (ref 0–44)
AST: 659 U/L — ABNORMAL HIGH (ref 15–41)
Albumin: 3.2 g/dL — ABNORMAL LOW (ref 3.5–5.0)
Alkaline Phosphatase: 162 U/L — ABNORMAL HIGH (ref 38–126)
Anion gap: 10 (ref 5–15)
BUN: 30 mg/dL — ABNORMAL HIGH (ref 8–23)
CO2: 24 mmol/L (ref 22–32)
Calcium: 8.5 mg/dL — ABNORMAL LOW (ref 8.9–10.3)
Chloride: 100 mmol/L (ref 98–111)
Creatinine, Ser: 1.59 mg/dL — ABNORMAL HIGH (ref 0.61–1.24)
GFR calc Af Amer: 43 mL/min — ABNORMAL LOW (ref >60)
GFR calc non Af Amer: 37 mL/min — ABNORMAL LOW (ref >60)
Glucose, Bld: 104 mg/dL — ABNORMAL HIGH (ref 70–99)
Potassium: 4.1 mmol/L (ref 3.5–5.1)
Sodium: 134 mmol/L — ABNORMAL LOW (ref 135–145)
Total Bilirubin: 6 mg/dL — ABNORMAL HIGH (ref 0.3–1.2)
Total Protein: 5.8 g/dL — ABNORMAL LOW (ref 6.5–8.1)

## 2020-05-02 LAB — CBC WITH DIFFERENTIAL/PLATELET
Absolute Monocytes: 1573 cells/uL — ABNORMAL HIGH (ref 200–950)
Basophils Absolute: 48 cells/uL (ref 0–200)
Basophils Relative: 0.2 %
Eosinophils Absolute: 121 cells/uL (ref 15–500)
Eosinophils Relative: 0.5 %
HCT: 44.2 % (ref 38.5–50.0)
Hemoglobin: 14.8 g/dL (ref 13.2–17.1)
Lymphs Abs: 799 cells/uL — ABNORMAL LOW (ref 850–3900)
MCH: 31.3 pg (ref 27.0–33.0)
MCHC: 33.5 g/dL (ref 32.0–36.0)
MCV: 93.4 fL (ref 80.0–100.0)
MPV: 11.5 fL (ref 7.5–12.5)
Monocytes Relative: 6.5 %
Neutro Abs: 21659 cells/uL — ABNORMAL HIGH (ref 1500–7800)
Neutrophils Relative %: 89.5 %
Platelets: 247 10*3/uL (ref 140–400)
RBC: 4.73 10*6/uL (ref 4.20–5.80)
RDW: 12.3 % (ref 11.0–15.0)
Total Lymphocyte: 3.3 %
WBC: 24.2 10*3/uL — ABNORMAL HIGH (ref 3.8–10.8)

## 2020-05-02 LAB — PROTIME-INR
INR: 2 — ABNORMAL HIGH (ref 0.8–1.2)
Prothrombin Time: 22.2 seconds — ABNORMAL HIGH (ref 11.4–15.2)

## 2020-05-02 LAB — TROPONIN I (HIGH SENSITIVITY): Troponin I (High Sensitivity): 48 ng/L — ABNORMAL HIGH (ref <18)

## 2020-05-02 MED ORDER — IOHEXOL 300 MG/ML  SOLN
85.0000 mL | Freq: Once | INTRAMUSCULAR | Status: AC | PRN
  Administered 2020-05-02: 85 mL via INTRAVENOUS

## 2020-05-02 MED ORDER — ACETAMINOPHEN 325 MG PO TABS
650.0000 mg | ORAL_TABLET | Freq: Four times a day (QID) | ORAL | Status: DC | PRN
  Filled 2020-05-02: qty 2

## 2020-05-02 MED ORDER — VITAMIN D 25 MCG (1000 UNIT) PO TABS
2000.0000 [IU] | ORAL_TABLET | Freq: Every day | ORAL | Status: DC
  Administered 2020-05-02 – 2020-05-14 (×12): 2000 [IU] via ORAL
  Filled 2020-05-02 (×12): qty 2

## 2020-05-02 MED ORDER — KCL IN DEXTROSE-NACL 20-5-0.45 MEQ/L-%-% IV SOLN
INTRAVENOUS | Status: DC
  Filled 2020-05-02 (×6): qty 1000

## 2020-05-02 MED ORDER — SODIUM CHLORIDE 0.9 % IV SOLN
2.0000 g | Freq: Two times a day (BID) | INTRAVENOUS | Status: DC
  Administered 2020-05-02: 2 g via INTRAVENOUS
  Filled 2020-05-02 (×3): qty 2

## 2020-05-02 MED ORDER — SODIUM CHLORIDE 0.9 % IV SOLN: INTRAVENOUS | Status: DC

## 2020-05-02 MED ORDER — ONDANSETRON HCL 4 MG PO TABS: 4.0000 mg | ORAL_TABLET | Freq: Four times a day (QID) | ORAL | Status: DC | PRN

## 2020-05-02 MED ORDER — CIPROFLOXACIN IN D5W 400 MG/200ML IV SOLN
400.0000 mg | Freq: Two times a day (BID) | INTRAVENOUS | Status: DC
  Administered 2020-05-02: 400 mg via INTRAVENOUS
  Filled 2020-05-02 (×2): qty 200

## 2020-05-02 MED ORDER — MAGNESIUM HYDROXIDE 400 MG/5ML PO SUSP: 30.0000 mL | Freq: Every day | ORAL | Status: DC | PRN

## 2020-05-02 MED ORDER — METOPROLOL SUCCINATE ER 25 MG PO TB24
25.0000 mg | ORAL_TABLET | Freq: Every day | ORAL | Status: DC
  Administered 2020-05-02 – 2020-05-14 (×12): 25 mg via ORAL
  Filled 2020-05-02 (×13): qty 1

## 2020-05-02 MED ORDER — TRAZODONE HCL 50 MG PO TABS: 25.0000 mg | ORAL_TABLET | Freq: Every evening | ORAL | Status: DC | PRN

## 2020-05-02 MED ORDER — ACETAMINOPHEN 650 MG RE SUPP: 650.0000 mg | Freq: Four times a day (QID) | RECTAL | Status: DC | PRN

## 2020-05-02 MED ORDER — PANTOPRAZOLE SODIUM 40 MG PO TBEC
40.0000 mg | DELAYED_RELEASE_TABLET | Freq: Every day | ORAL | Status: DC
  Administered 2020-05-02 – 2020-05-06 (×5): 40 mg via ORAL
  Filled 2020-05-02 (×5): qty 1

## 2020-05-02 MED ORDER — FINASTERIDE 5 MG PO TABS
5.0000 mg | ORAL_TABLET | Freq: Every day | ORAL | Status: DC
  Administered 2020-05-02 – 2020-05-14 (×13): 5 mg via ORAL
  Filled 2020-05-02 (×13): qty 1

## 2020-05-02 MED ORDER — ONDANSETRON HCL 4 MG PO TABS: 4.0000 mg | ORAL_TABLET | Freq: Three times a day (TID) | ORAL | Status: DC | PRN

## 2020-05-02 MED ORDER — ONDANSETRON HCL 4 MG PO TABS: 4.0000 mg | ORAL_TABLET | Freq: Three times a day (TID) | ORAL | 0 refills | Status: DC | PRN

## 2020-05-02 MED ORDER — MORPHINE SULFATE (PF) 2 MG/ML IV SOLN
2.0000 mg | INTRAVENOUS | Status: DC | PRN
  Administered 2020-05-03 – 2020-05-06 (×3): 2 mg via INTRAVENOUS
  Filled 2020-05-02 (×4): qty 1

## 2020-05-02 MED ORDER — ONDANSETRON HCL 4 MG/2ML IJ SOLN
4.0000 mg | Freq: Four times a day (QID) | INTRAMUSCULAR | Status: DC | PRN
  Administered 2020-05-05 – 2020-05-06 (×2): 4 mg via INTRAVENOUS
  Filled 2020-05-02 (×2): qty 2

## 2020-05-02 NOTE — Progress Notes (Addendum)
Name: Jose Castro   MRN: [id number]    DOB: Mar 03, 1926   Date:05/02/2020       Progress Note  Subjective  Chief Complaint  Chief Complaint  Patient presents with  . Follow-up    ED 05/01/2020    HPI  EC follow up: daughter [name] ) did most of the talking. Mr. Weidinger is not feeling well and was not as talkative as usual. He developed substernal chest pain and indigestion yesterday around 2 pm, they tried otc medication ( she thinks pepcid) but symptoms did not improve, pain moved down to upper abdominal area, radiated to right shoulder and he developed nausea and vomiting. They took him to [facility], he had a negative lipase, WBC slightly elevated, troponin normal time 2, they left AMA around 10.30 pm.  During the night he continue to not feel well, did not sleep good, has not been eating or drinking fluids , he cannot qualify or quantify the pain, just says " I don't feel well" He denies SOB but his pulse ox was low upon arrival, but went up to 93 % once extremities warmed up.   Family does not want to take him back to [facility]. Explained that he may have an peptic ulcer, mesenteric artery occlusion, cholecystitis, less likely MI ( since negative troponin ) and pancreatitis ( since negative lipase)   Patient Active Problem List   Diagnosis Date Noted  . Chronic venous insufficiency 12/05/2019  . 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 CKD (chronic kidney disease), stage II   . New onset atrial fibrillation ([place])   . 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 Goals of care, counseling/discussion   . Palliative care by specialist   . DNR (do not resuscitate) discussion   . History of CVA (cerebrovascular accident) 05/22/2019  . Persistent proteinuria 07/30/2018  . 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 Chronic kidney disease, stage III (moderate) 06/15/2018  . Leg pain 05/06/2018  . Lymphedema 05/06/2018  . Coagulopathy ([place]) 07/07/2017  . Lumbar spondylosis  03/10/2017  . Elevated uric acid in blood 11/20/2016  . BPH (benign prostatic hyperplasia) 05/29/2016  . Peripheral vascular disease of lower extremity ([place]) 03/03/2016  . Gallstone 12/31/2015  . 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 Atherosclerosis of aorta ([place]) 11/22/2015  . Carotid artery narrowing 11/22/2015  . Diverticulosis of colon 11/22/2015  . Decreased creatinine clearance 11/22/2015  . Arthritis, degenerative 11/22/2015  . Lactose intolerance 11/22/2015  . Basal cell carcinoma 11/22/2015  . Essential hypertension 06/21/2015  . Hyperlipemia 06/21/2015  . GERD (gastroesophageal reflux disease) 06/21/2015  . Calculus of kidney 11/18/2013    Past Surgical History:  Procedure Laterality Date  . APPENDECTOMY    . CATARACT EXTRACTION, BILATERAL    . CYSTOSCOPY WITH INSERTION OF UROLIFT    . EYE SURGERY  08/2018  . KIDNEY STONE SURGERY    . KNEE SURGERY    . PACEMAKER INSERTION N/A 07/13/2018   Procedure: INSERTION PACEMAKER-DUEL CHAMBER INITIAL IMPLANT;  Surgeon: Isaias Cowman, MD;  Location: [facility] ORS;  Service: Cardiovascular;  Laterality: N/A;  . SPINE SURGERY      Family History  Problem Relation Age of Onset  . Cancer Brother        bladder cancer with mets  . Heart disease Mother   . Aortic aneurysm Mother   . Heart attack Maternal Aunt   . Heart attack Maternal Uncle  Social History   Tobacco Use  . Smoking status: Former Smoker    Packs/day: 0.00    Years: 16.00    Pack years: 0.00    Types: Cigarettes    Start date: 10/21/1944    Quit date: 10/21/1960    Years since quitting: 59.5  . Smokeless tobacco: Never Used  Substance Use Topics  . Alcohol use: Yes    Alcohol/week: 3.0 standard drinks    Types: 3 Standard drinks or equivalent per week    Comment: once a week when he goes out to dinner-socially     Current Outpatient Medications:  .  acetaminophen (TYLENOL) 325 MG tablet, Take 2 tablets (650 mg  total) by mouth every 4 (four) hours as needed for mild pain (or temp > 37.5 C (99.5 F)). (Patient taking differently: Take 650 mg by mouth as needed for mild pain (or temp > 37.5 C (99.5 F)). ), Disp:  , Rfl:  .  apixaban (ELIQUIS) 2.5 MG TABS tablet, Take 1 tablet (2.5 mg total) by mouth 2 (two) times daily., Disp: 60 tablet, Rfl: 0 .  Cholecalciferol (VITAMIN D3) 50 MCG (2000 UT) TABS, Take 2,000 Units by mouth daily., Disp: 30 tablet, Rfl: 0 .  finasteride (PROSCAR) 5 MG tablet, Take 1 tablet (5 mg total) by mouth daily., Disp: 30 tablet, Rfl: 0 .  metoprolol succinate (TOPROL XL) 25 MG 24 hr tablet, Take 1 tablet (25 mg total) by mouth daily., Disp: 30 tablet, Rfl: 0 .  Omega-3 Fatty Acids (FISH OIL) 1000 MG CAPS, Take 1,000 mg by mouth daily. , Disp: , Rfl:  .  pantoprazole (PROTONIX) 40 MG tablet, Take 1 tablet (40 mg total) by mouth daily., Disp: 90 tablet, Rfl: 1 .  rosuvastatin (CRESTOR) 40 MG tablet, Take 1 tablet (40 mg total) by mouth daily at 6 PM., Disp: 30 tablet, Rfl: 0  Allergies  Allergen Reactions  . Penicillins Itching and Swelling    Has patient had a PCN reaction causing immediate rash, facial/tongue/throat swelling, SOB or lightheadedness with hypotension: No Has patient had a PCN reaction causing severe rash involving mucus membranes or skin necrosis: No Has patient had a PCN reaction that required hospitalization: No Has patient had a PCN reaction occurring within the last 10 years: No If all of the above answers are "NO", then may proceed with Cephalosporin use.     I personally reviewed active problem list, medication list, allergies, family history, social history, health maintenance with the patient/caregiver today.   ROS  Ten systems reviewed and is negative except as mentioned in HPI   Objective  Vitals:   05/02/20 1153  BP: 124/66  Pulse: 64  Resp: (!) 24  Temp: 98.3 F (36.8 C)  TempSrc: Temporal  SpO2: (!) 76%  Weight: 165 lb (74.8 kg)     Body mass index is 23.68 kg/m.  Physical Exam  Constitutional: Patient appears well-developed , quiet but in no distress.  HEENT: head atraumatic, normocephalic, pupils equal and reactive to light, neck supple, oral mucosa moist Cardiovascular: Normal rate, regular rhythm and normal heart sounds.  No murmur heard. No BLE edema. Pulmonary/Chest: Effort normal and breath sounds normal. No respiratory distress. Abdominal: Soft.  Normal bowel sounds, mild tenderness during exam, not localizing, no guarding or rebound tenderness  Psychiatric: Patient has a normal mood and affect. behavior is normal. Judgment and thought content normal.  Recent Results (from the past 2160 hour(s))  Lipid panel     Status: Abnormal  Collection Time: 03/06/20  4:12 PM  Result Value Ref Range   Cholesterol 109 <200 mg/dL   HDL 35 (L) > OR = 40 mg/dL   Triglycerides 93 <150 mg/dL   LDL Cholesterol (Calc) 56 mg/dL (calc)    Comment: Reference range: <100 . Desirable range <100 mg/dL for primary prevention;   <70 mg/dL for patients with CHD or diabetic patients  with > or = 2 CHD risk factors. Marland Kitchen LDL-C is now calculated using the Martin-Hopkins  calculation, which is a validated novel method providing  better accuracy than the Friedewald equation in the  estimation of LDL-C.  Cresenciano Genre et al. Annamaria Helling. 1017;510(25): 2061-2068  (http://education.[employer].com/faq/FAQ164)    Total CHOL/HDL Ratio 3.1 <5.0 (calc)   Non-HDL Cholesterol (Calc) 74 <130 mg/dL (calc)    Comment: For patients with diabetes plus 1 major ASCVD risk  factor, treating to a non-HDL-C goal of <100 mg/dL  (LDL-C of <70 mg/dL) is considered a therapeutic  option.   CBC with Differential/Platelet     Status: Abnormal   Collection Time: 03/06/20  4:12 PM  Result Value Ref Range   WBC 8.3 3.8 - 10.8 Thousand/uL   RBC 5.19 4.20 - 5.80 Million/uL   Hemoglobin 15.9 13.2 - 17.1 g/dL   HCT 47.4 38.5 - 50.0 %   MCV 91.3 80.0 - 100.0  fL   MCH 30.6 27.0 - 33.0 pg   MCHC 33.5 32.0 - 36.0 g/dL   RDW 12.3 11.0 - 15.0 %   Platelets 302 140 - 400 Thousand/uL   MPV 11.3 7.5 - 12.5 fL   Neutro Abs 4,026 1,500 - 7,800 cells/uL   Lymphs Abs 2,855 850 - 3,900 cells/uL   Absolute Monocytes 1,013 (H) 200 - 950 cells/uL   Eosinophils Absolute 307 15 - 500 cells/uL   Basophils Absolute 100 0 - 200 cells/uL   Neutrophils Relative % 48.5 %   Total Lymphocyte 34.4 %   Monocytes Relative 12.2 %   Eosinophils Relative 3.7 %   Basophils Relative 1.2 %  COMPLETE METABOLIC PANEL WITH GFR     Status: Abnormal   Collection Time: 03/06/20  4:12 PM  Result Value Ref Range   Glucose, Bld 107 (H) 65 - 99 mg/dL    Comment: .            Fasting reference interval . For someone without known diabetes, a glucose value between 100 and 125 mg/dL is consistent with prediabetes and should be confirmed with a follow-up test. .    BUN 23 7 - 25 mg/dL   Creat 1.27 (H) 0.70 - 1.11 mg/dL    Comment: For patients >72 years of age, the reference limit for Creatinine is approximately 13% higher for people identified as African-American. .    GFR, Est Non African American 49 (L) > OR = 60 mL/min/1.51m2   GFR, Est African American 57 (L) > OR = 60 mL/min/1.18m2   BUN/Creatinine Ratio 18 6 - 22 (calc)   Sodium 140 135 - 146 mmol/L   Potassium 4.6 3.5 - 5.3 mmol/L   Chloride 103 98 - 110 mmol/L   CO2 30 20 - 32 mmol/L   Calcium 9.9 8.6 - 10.3 mg/dL   Total Protein 6.1 6.1 - 8.1 g/dL   Albumin 3.9 3.6 - 5.1 g/dL   Globulin 2.2 1.9 - 3.7 g/dL (calc)   AG Ratio 1.8 1.0 - 2.5 (calc)   Total Bilirubin 0.7 0.2 - 1.2 mg/dL   Alkaline phosphatase (APISO)  65 35 - 144 U/L   AST 17 10 - 35 U/L   ALT 20 9 - 46 U/L  Hemoglobin A1c     Status: Abnormal   Collection Time: 03/06/20  4:12 PM  Result Value Ref Range   Hgb A1c MFr Bld 5.8 (H) <5.7 % of total Hgb    Comment: For someone without known diabetes, a hemoglobin  A1c value between 5.7% and 6.4%  is consistent with prediabetes and should be confirmed with a  follow-up test. . For someone with known diabetes, a value <7% indicates that their diabetes is well controlled. A1c targets should be individualized based on duration of diabetes, age, comorbid conditions, and other considerations. . This assay result is consistent with an increased risk of diabetes. . Currently, no consensus exists regarding use of hemoglobin A1c for diagnosis of diabetes for children. .    Mean Plasma Glucose 120 (calc)   eAG (mmol/L) 6.6 (calc)  Vitamin B12     Status: None   Collection Time: 03/06/20  4:12 PM  Result Value Ref Range   Vitamin B-12 351 200 - 1,100 pg/mL    Comment: . Please Note: Although the reference range for vitamin B12 is [phone] pg/mL, it has been reported that between 5 and 10% of patients with values between 200 and 400 pg/mL may experience neuropsychiatric and hematologic abnormalities due to occult B12 deficiency; less than 1% of patients with values above 400 pg/mL will have symptoms. .   Basic metabolic panel     Status: Abnormal   Collection Time: 05/01/20  5:01 PM  Result Value Ref Range   Sodium 143 135 - 145 mmol/L   Potassium 3.5 3.5 - 5.1 mmol/L   Chloride 108 98 - 111 mmol/L   CO2 26 22 - 32 mmol/L   Glucose, Bld 92 70 - 99 mg/dL    Comment: Glucose reference range applies only to samples taken after fasting for at least 8 hours.   BUN 18 8 - 23 mg/dL   Creatinine, Ser 1.11 0.61 - 1.24 mg/dL   Calcium 9.0 8.9 - 10.3 mg/dL   GFR calc non Af Amer 58 (L) >60 mL/min   GFR calc Af Amer >60 >60 mL/min   Anion gap 9 5 - 15    Comment: Performed at [facility], [address] [place], [place] [address]  CBC     Status: Abnormal   Collection Time: 05/01/20  5:01 PM  Result Value Ref Range   WBC 11.4 (H) 4.0 - 10.5 K/uL   RBC 5.06 4.22 - 5.81 MIL/uL   Hemoglobin 15.8 13.0 - 17.0 g/dL   HCT 46.7 39.0 - 52.0 %   MCV 92.3 80.0 - 100.0 fL   MCH  31.2 26.0 - 34.0 pg   MCHC 33.8 30.0 - 36.0 g/dL   RDW 13.6 11.5 - 15.5 %   Platelets 287 150 - 400 K/uL   nRBC 0.0 0.0 - 0.2 %    Comment: Performed at [facility], [place], [place] [address]  Troponin I (High Sensitivity)     Status: None   Collection Time: 05/01/20  5:01 PM  Result Value Ref Range   Troponin I (High Sensitivity) 16 <18 ng/L    Comment: (NOTE) Elevated high sensitivity troponin I (hsTnI) values and significant  changes across serial measurements may suggest ACS but many other  chronic and acute conditions are known to elevate hsTnI results.  Refer to the "Links"  section for chest pain algorithms and additional  guidance. Performed at [facility], [address] [place], [place] [address]   Lipase, blood     Status: None   Collection Time: 05/01/20  5:01 PM  Result Value Ref Range   Lipase 46 11 - 51 U/L    Comment: Performed at [facility], [place], [place] [address]  Troponin I (High Sensitivity)     Status: None   Collection Time: 05/01/20  7:03 PM  Result Value Ref Range   Troponin I (High Sensitivity) 16 <18 ng/L    Comment: (NOTE) Elevated high sensitivity troponin I (hsTnI) values and significant  changes across serial measurements may suggest ACS but many other  chronic and acute conditions are known to elevate hsTnI results.  Refer to the "Links" section for chest pain algorithms and additional  guidance. Performed at [facility], [place], [place] [address]       PHQ2/9: Depression screen [facility] 2/9 05/02/2020 03/06/2020 02/07/2020 06/22/2019 02/01/2019  Decreased Interest 0 0 0 0 0  Down, Depressed, Hopeless 0 0 0 0 0  PHQ - 2 Score 0 0 0 0 0  Altered sleeping 0 0 - 1 -  Tired, decreased energy 0 0 - 0 -  Change in appetite 0 0 - 0 -  Feeling bad or failure about yourself  0 0 - 0 -  Trouble concentrating 0 0 - 0 -  Moving slowly or fidgety/restless 0 0 - 0 -   Suicidal thoughts 0 0 - 0 -  PHQ-9 Score 0 0 - 1 -  Difficult doing work/chores - Not difficult at all - Not difficult at all -  Some recent data might be hidden    phq 9 is negative   Fall Risk: Fall Risk  05/02/2020 03/06/2020 02/07/2020 01/17/2020 11/08/2019  Falls in the past year? 0 0 0 0 0  Number falls in past yr: - 0 0 - -  Injury with Fall? - 0 0 - -  Risk for fall due to : Impaired balance/gait;Impaired mobility - Impaired balance/gait;Impaired mobility - -  Follow up - - Falls prevention discussed - -    Functional Status Survey: Is the patient deaf or have difficulty hearing?: Yes(wears hearing aids) Does the patient have difficulty seeing, even when wearing glasses/contacts?: No Does the patient have difficulty concentrating, remembering, or making decisions?: No Does the patient have difficulty walking or climbing stairs?: Yes Does the patient have difficulty dressing or bathing?: No Does the patient have difficulty doing errands alone such as visiting a doctor's office or shopping?: Yes    Assessment & Plan  1. Abdominal pain, bilateral upper quadrant  - CBC with Differential/Platelet - CT ABDOMEN W CONTRAST; Future Go to [facility] if normal CT and or if unable to drink fluids otherwise he will dehydrate  2. Dyspepsia  - CT ABDOMEN W CONTRAST; Future Try [place], continue PPI  3. Non-intractable vomiting with nausea, unspecified vomiting type  - ondansetron (ZOFRAN) 4 MG tablet; Take 1 tablet (4 mg total) by mouth every 8 (eight) hours as needed for nausea or vomiting.  Dispense: 20 tablet; Refill: 0 - CT ABDOMEN W CONTRAST; Future  4. Leukocytosis, unspecified type  - CBC with Differential/Platelet - CT ABDOMEN W CONTRAST; Future

## 2020-05-02 NOTE — Consult Note (Signed)
Reason for Consult:Abdominal pain, gallbladder hydrops  Referring Physician: Etter Sjogren, MD  Jose Castro is an 86 y.o. male.  This gentleman was in his usual state of health until yesterday when after eating a honey bun and having a cup of coffee had severe retrosternal pain.  This expanded to include the epigastrium and then the back.  He had several episodes of vomiting.  He presented to the emergency room yesterday afternoon and had lab work done, and myocardial infarction was shown not to be occurring.  He never got into the back or was able to see a physician and after about 6 hours he elected to go home.  This morning he had abdominal pain and saw his primary care physician.  She had a CT scan completed showing evidence of marked distention of the gallbladder.  Due to his symptoms it was felt appropriate to admit him to the hospital.  His primary care physician had contacted me after the CT scan and I had recommended admission to the hospitalist service.  There was somewhat of a confusion and he was admitted to my service, but will be admitted by the hospitalist.  Patient has been on Eliquis since June 2020 when he suffered a stroke which he is made an excellent recovery.  He was able to return home after 2 weeks rehab.  He does have 24-hour in-home coverage as 4 of his 6 children live in the area.  He is able to go up and down stairs and until yesterday's incident with a honey bun, has had no dietary restriction.  At this time the patient reports being pain-free and hungry.  The patient's daughter reported that when he has had CT contrast in the past this will frequently trigger an episode of gout.  This information has been passed on to the [facility] hospitalist who will be admitting the patient.    Past Medical History:  Diagnosis Date  . Allergy    Penicillin  . Arthritis   . Basal cell carcinoma   . BPH (benign prostatic hyperplasia)   . Chronic kidney disease    had a kidney stone  which per family was a cyst that was removed  . Dysrhythmia   . GERD (gastroesophageal reflux disease)   . History of kidney stones   . Hyperlipidemia   . Hypertension   . Left-sided low back pain with left-sided sciatica   . Splenic vein thrombosis   . Stroke ([place]) 05/22/2019  . Thrombosis 12/2015   mural  area and no notation of heart attack    Past Surgical History:  Procedure Laterality Date  . APPENDECTOMY    . CATARACT EXTRACTION, BILATERAL    . CYSTOSCOPY WITH INSERTION OF UROLIFT    . EYE SURGERY  08/2018  . KIDNEY STONE SURGERY    . KNEE SURGERY    . PACEMAKER INSERTION N/A 07/13/2018   Procedure: INSERTION PACEMAKER-DUEL CHAMBER INITIAL IMPLANT;  Surgeon: Isaias Cowman, MD;  Location: [facility] ORS;  Service: Cardiovascular;  Laterality: N/A;  . SPINE SURGERY      Family History  Problem Relation Age of Onset  . Cancer Brother        bladder cancer with mets  . Heart disease Mother   . Aortic aneurysm Mother   . Heart attack Maternal Aunt   . Heart attack Maternal Uncle     Social History:  reports that he quit smoking about 59 years ago. His smoking use included cigarettes. He started smoking about  75 years ago. He smoked 0.00 packs per day for 16.00 years. He has never used smokeless tobacco. He reports current alcohol use of about 3.0 standard drinks of alcohol per week. He reports that he does not use drugs.  Allergies:  Allergies  Allergen Reactions  . Penicillins Itching and Swelling    Has patient had a PCN reaction causing immediate rash, facial/tongue/throat swelling, SOB or lightheadedness with hypotension: No Has patient had a PCN reaction causing severe rash involving mucus membranes or skin necrosis: No Has patient had a PCN reaction that required hospitalization: No Has patient had a PCN reaction occurring within the last 10 years: No If all of the above answers are "NO", then may proceed with Cephalosporin use.     Medications: I have reviewed  the patient's current medications.  Results for orders placed or performed in visit on 05/02/20 (from the past 48 hour(s))  CBC with Differential/Platelet     Status: Abnormal   Collection Time: 05/02/20 12:25 PM  Result Value Ref Range   WBC 24.2 (H) 3.8 - 10.8 Thousand/uL   RBC 4.73 4.20 - 5.80 Million/uL   Hemoglobin 14.8 13.2 - 17.1 g/dL   HCT 44.2 38.5 - 50.0 %   MCV 93.4 80.0 - 100.0 fL   MCH 31.3 27.0 - 33.0 pg   MCHC 33.5 32.0 - 36.0 g/dL   RDW 12.3 11.0 - 15.0 %   Platelets 247 140 - 400 Thousand/uL   MPV 11.5 7.5 - 12.5 fL   Neutro Abs 21,659 (H) 1,500 - 7,800 cells/uL   Lymphs Abs 799 (L) 850 - 3,900 cells/uL   Absolute Monocytes 1,573 (H) 200 - 950 cells/uL   Eosinophils Absolute 121 15 - 500 cells/uL   Basophils Absolute 48 0 - 200 cells/uL   Neutrophils Relative % 89.5 %   Total Lymphocyte 3.3 %   Monocytes Relative 6.5 %   Eosinophils Relative 0.5 %   Basophils Relative 0.2 %    DG Chest 2 View  Result Date: 05/01/2020 CLINICAL DATA:  Central chest pain.  Nausea. EXAM: CHEST - 2 VIEW COMPARISON:  06/01/2019 FINDINGS: Moderate thoracic spondylosis. Pacer with leads at right atrium and right ventricle. No lead discontinuity. Midline trachea. Normal heart size. Atherosclerosis in the transverse aorta. Tortuous thoracic aorta. No pleural effusion or pneumothorax. Clear lungs. IMPRESSION: No acute cardiopulmonary disease. Aortic Atherosclerosis (ICD10-I70.0). Electronically Signed   By: Abigail Miyamoto M.D.   On: 05/01/2020 17:34   CT Abdomen Pelvis W Contrast  Result Date: 05/02/2020 CLINICAL DATA:  Nausea, vomiting, abdominal pain. EXAM: CT ABDOMEN AND PELVIS WITH CONTRAST TECHNIQUE: Multidetector CT imaging of the abdomen and pelvis was performed using the standard protocol following bolus administration of intravenous contrast. CONTRAST:  87mL OMNIPAQUE IOHEXOL 300 MG/ML  SOLN COMPARISON:  07/07/2018 FINDINGS: Lower chest: Pacer wires noted in the right heart. No acute  abnormality. Hepatobiliary: Gallbladder is distended with mild wall thickening. Appearance is concerning for possible cholecystitis. Suspect small layering stones. No biliary ductal dilatation. Diffuse fatty infiltration of the liver without focal hepatic abnormality. Pancreas: No focal abnormality or ductal dilatation. Spleen: No focal abnormality.  Normal size. Adrenals/Urinary Tract: Numerous bilateral renal cysts. No hydronephrosis. Adrenal glands unremarkable. Urinary bladder is trabeculated with numerous diverticula. Stomach/Bowel: Large stool burden in the rectosigmoid colon. Left colonic diverticulosis. No active diverticulitis. Stomach and small bowel decompressed, unremarkable. Vascular/Lymphatic: Aortic atherosclerosis. No aneurysm or adenopathy. Reproductive: Prostate enlargement with calcifications. Probable radiation seeds in the prostate. Other: No free fluid  or free air. Musculoskeletal: Diffuse degenerative disc and facet disease. No acute bony abnormality or focal bone lesion. IMPRESSION: Gallbladder is distended with mild apparent wall thickening. Probable small layering stones. Findings concerning for cholecystitis. This could be further evaluated with right upper quadrant ultrasound. Fatty infiltration of the liver. Prostate enlargement with trabeculated bladder and numerous bladder wall diverticula. No hydronephrosis. Aortic atherosclerosis. Electronically Signed   By: Rolm Baptise M.D.   On: 05/02/2020 15:29    Review of Systems  Constitutional: Negative.   HENT: Negative.   Eyes: Negative.   Respiratory: Negative.   Cardiovascular: Positive for chest pain.  Gastrointestinal: Positive for abdominal pain, nausea and vomiting.  Endocrine: Negative.   Genitourinary: Negative.   Musculoskeletal: Negative.   Allergic/Immunologic: Negative.   Neurological: Negative.   Hematological: Negative.   Psychiatric/Behavioral: Negative.    Blood pressure 113/64, pulse 62, temperature 99.9  F (37.7 C), temperature source Oral, resp. rate 20, SpO2 95 %. Physical Exam  Constitutional: He is oriented to person, place, and time. He appears well-developed and well-nourished.  HENT:  Head: Normocephalic and atraumatic.  Neck: No tracheal deviation present. No thyromegaly present.  Cardiovascular: Normal rate and regular rhythm.  Respiratory: Effort normal and breath sounds normal.  GI: Soft. Bowel sounds are normal. There is abdominal tenderness (Minimal RUQ with deep palpation).  Musculoskeletal:        General: Normal range of motion.     Cervical back: Neck supple.  Neurological: He is alert and oriented to person, place, and time. He has normal reflexes.  Slight drop left upper lip.   Skin: Skin is warm.  Psychiatric: He has a normal mood and affect. His behavior is normal. Judgment and thought content normal.    Assessment/Plan: CT of today reviewed.  Hydrops off the gallbladder.   Marked increase in WBC from last night to today.  Patient appears fairly comfortable. No further nausea. Hungry.  Discuss admission by [facility] with Dr. Argie Ramming.  He is amenable.  I have reviewed today's CT with Aletta Edouard, MD from interventional radiology.  Plan for ultrasound tonight and CT guided drainage in the AM. He is aware the patient has been on Eliquis. (This was started after his stroke last summer.)  Reviewed with his daughters Venson Ferencz and Renata Caprice who were with him tonight the plans as I understand them at this time.  If there is no gallbladder necrosis necessitating emergent surgery, would observe until WBC resolved and send home w/ the cholecystostomy tube.   Approximately 1 hour was spent reviewing the images, meeting with the interventional radiologist and examining the patient.  Forest Gleason Maricella Filyaw 05/02/2020, 7:51 PM

## 2020-05-02 NOTE — Addendum Note (Signed)
Addended by: Steele Sizer F on: 05/02/2020 12:51 PM   Modules accepted: Orders

## 2020-05-02 NOTE — H&P (Addendum)
[facility] [place] NAME: Jose Castro    MR#:  [id number]  DATE OF BIRTH:  [birth year]  DATE OF ADMISSION:  05/02/2020  PRIMARY CARE PHYSICIAN: Steele Sizer, MD   REQUESTING/REFERRING PHYSICIAN: Robert Bellow, MD  CHIEF COMPLAINT:    HISTORY OF PRESENT ILLNESS:  Jose Castro  is a 86 y.o. male with a known history of hypertension, dyslipidemia, BPH, CVA, GERD and osteoarthritis who was directly admitted for epigastric pain and nausea with dry heaves since last night and that was preceded by an ER visit for substernal chest pain yesterday when he was ruled out ACS and discharged.  Today he was seen by his primary care physician who discussed the case with Dr. Tollie Pizza for direct admission.  No fever or chills.  No dyspnea or cough or wheezing.  No bleeding diathesis.  No dysuria, oliguria or hematuria or flank pain.   On the floor the patient's heart rate was 59 with otherwise normal vital signs.  Labs revealed neck cytosis 24.2 with neutrophilia, INR of 2 PT of 22.2 and CMP was remarkable for sodium 134 BUN of 30 and creatinine 1.59 that were previously normal on 05/01/2020, AST of 659 and ALT 690 and alk phos of 162 with total protein 5.8 albumin of 3.2, total bili of 6, high-sensitivity troponin I of 48 and later the same.  Patient had previously unremarkable LFTs.   Abdominal pelvic CT scan revealed: Gallbladder is distended with mild apparent wall thickening. Probable small layering stones. Findings concerning for cholecystitis. This could be further evaluated with right upper quadrant ultrasound.  Fatty infiltration of the liver.  Prostate enlargement with trabeculated bladder and numerous bladder wall diverticula. No hydronephrosis.  Aortic atherosclerosis.  The patient is on Eliquis for atrial fibrillation.  She is directly medically monitored bed for further evaluation and management.  PAST MEDICAL HISTORY:   Past Medical History:   Diagnosis Date  . Allergy    Penicillin  . Arthritis   . Basal cell carcinoma   . BPH (benign prostatic hyperplasia)   . Chronic kidney disease    had a kidney stone which per family was a cyst that was removed  . Dysrhythmia   . GERD (gastroesophageal reflux disease)   . History of kidney stones   . Hyperlipidemia   . Hypertension   . Left-sided low back pain with left-sided sciatica   . Splenic vein thrombosis   . Stroke ([place]) 05/22/2019  . Thrombosis 12/2015   mural  area and no notation of heart attack    PAST SURGICAL HISTORY:   Past Surgical History:  Procedure Laterality Date  . APPENDECTOMY    . CATARACT EXTRACTION, BILATERAL    . CYSTOSCOPY WITH INSERTION OF UROLIFT    . EYE SURGERY  08/2018  . KIDNEY STONE SURGERY    . KNEE SURGERY    . PACEMAKER INSERTION N/A 07/13/2018   Procedure: INSERTION PACEMAKER-DUEL CHAMBER INITIAL IMPLANT;  Surgeon: Isaias Cowman, MD;  Location: [facility] ORS;  Service: Cardiovascular;  Laterality: N/A;  . SPINE SURGERY      SOCIAL HISTORY:   Social History   Tobacco Use  . Smoking status: Former Smoker    Packs/day: 0.00    Years: 16.00    Pack years: 0.00    Types: Cigarettes    Start date: 10/21/1944    Quit date: 10/21/1960    Years since quitting: 59.5  . Smokeless tobacco: Never Used  Substance Use Topics  .  Alcohol use: Yes    Alcohol/week: 3.0 standard drinks    Types: 3 Standard drinks or equivalent per week    Comment: once a week when he goes out to dinner-socially    FAMILY HISTORY:   Family History  Problem Relation Age of Onset  . Cancer Brother        bladder cancer with mets  . Heart disease Mother   . Aortic aneurysm Mother   . Heart attack Maternal Aunt   . Heart attack Maternal Uncle     DRUG ALLERGIES:   Allergies  Allergen Reactions  . Penicillins Itching and Swelling    Has patient had a PCN reaction causing immediate rash, facial/tongue/throat swelling, SOB or lightheadedness with  hypotension: No Has patient had a PCN reaction causing severe rash involving mucus membranes or skin necrosis: No Has patient had a PCN reaction that required hospitalization: No Has patient had a PCN reaction occurring within the last 10 years: No If all of the above answers are "NO", then may proceed with Cephalosporin use.     REVIEW OF SYSTEMS:   ROS As per history of present illness. All pertinent systems were reviewed above. Constitutional,  HEENT, cardiovascular, respiratory, GI, GU, musculoskeletal, neuro, psychiatric, endocrine,  integumentary and hematologic systems were reviewed and are otherwise  negative/unremarkable except for positive findings mentioned above in the HPI.   MEDICATIONS AT HOME:   Prior to Admission medications   Medication Sig Start Date End Date Taking? Authorizing Provider  acetaminophen (TYLENOL) 325 MG tablet Take 2 tablets (650 mg total) by mouth every 4 (four) hours as needed for mild pain (or temp > 37.5 C (99.5 F)). Patient taking differently: Take 650 mg by mouth as needed for mild pain (or temp > 37.5 C (99.5 F)).  06/07/19   Angiulli, Lavon Paganini, PA-C  apixaban (ELIQUIS) 2.5 MG TABS tablet Take 1 tablet (2.5 mg total) by mouth 2 (two) times daily. 06/07/19   Angiulli, Lavon Paganini, PA-C  Cholecalciferol (VITAMIN D3) 50 MCG (2000 UT) TABS Take 2,000 Units by mouth daily. 06/07/19   Angiulli, Lavon Paganini, PA-C  finasteride (PROSCAR) 5 MG tablet Take 1 tablet (5 mg total) by mouth daily. 06/07/19   Angiulli, Lavon Paganini, PA-C  metoprolol succinate (TOPROL XL) 25 MG 24 hr tablet Take 1 tablet (25 mg total) by mouth daily. 06/07/19   Angiulli, Lavon Paganini, PA-C  Omega-3 Fatty Acids (FISH OIL) 1000 MG CAPS Take 1,000 mg by mouth daily.     [provider]  ondansetron (ZOFRAN) 4 MG tablet Take 1 tablet (4 mg total) by mouth every 8 (eight) hours as needed for nausea or vomiting. 05/02/20   Steele Sizer, MD  pantoprazole (PROTONIX) 40 MG tablet Take 1 tablet (40 mg  total) by mouth daily. 06/07/19   Angiulli, Lavon Paganini, PA-C  rosuvastatin (CRESTOR) 40 MG tablet Take 1 tablet (40 mg total) by mouth daily at 6 PM. 06/07/19   Angiulli, Lavon Paganini, PA-C      VITAL SIGNS:  Blood pressure 113/64, pulse 62, temperature 99.9 F (37.7 C), temperature source Oral, resp. rate 20, SpO2 95 %.  PHYSICAL EXAMINATION:  Physical Exam  GENERAL:  86 y.o.-year-old Caucasian male patient lying in the bed with no acute distress.  EYES: Pupils equal, round, reactive to light and accommodation. No scleral icterus. Extraocular muscles intact.  HEENT: Head atraumatic, normocephalic. Oropharynx and nasopharynx clear.  NECK:  Supple, no jugular venous distention. No thyroid enlargement, no tenderness.  LUNGS:  Normal breath sounds bilaterally, no wheezing, rales,rhonchi or crepitation. No use of accessory muscles of respiration.  CARDIOVASCULAR: Regular rate and rhythm, S1, S2 normal. No murmurs, rubs, or gallops.  ABDOMEN: Soft, nondistended, nontender. Bowel sounds present. No organomegaly or mass.  EXTREMITIES: No pedal edema, cyanosis, or clubbing.  NEUROLOGIC: Cranial nerves II through XII are intact. Muscle strength 5/5 in all extremities. Sensation intact. Gait not checked.  PSYCHIATRIC: The patient is alert and oriented x 3.  Normal affect and good eye contact. SKIN: No obvious rash, lesion, or ulcer.   LABORATORY PANEL:   CBC Recent Labs  Lab 05/02/20 1225  WBC 24.2*  HGB 14.8  HCT 44.2  PLT 247   ------------------------------------------------------------------------------------------------------------------  Chemistries  Recent Labs  Lab 05/01/20 1701  NA 143  K 3.5  CL 108  CO2 26  GLUCOSE 92  BUN 18  CREATININE 1.11  CALCIUM 9.0   ------------------------------------------------------------------------------------------------------------------  Cardiac Enzymes No results for input(s): TROPONINI in the last 168  hours. ------------------------------------------------------------------------------------------------------------------  RADIOLOGY:  DG Chest 2 View  Result Date: 05/01/2020 CLINICAL DATA:  Central chest pain.  Nausea. EXAM: CHEST - 2 VIEW COMPARISON:  06/01/2019 FINDINGS: Moderate thoracic spondylosis. Pacer with leads at right atrium and right ventricle. No lead discontinuity. Midline trachea. Normal heart size. Atherosclerosis in the transverse aorta. Tortuous thoracic aorta. No pleural effusion or pneumothorax. Clear lungs. IMPRESSION: No acute cardiopulmonary disease. Aortic Atherosclerosis (ICD10-I70.0). Electronically Signed   By: Abigail Miyamoto M.D.   On: 05/01/2020 17:34   CT Abdomen Pelvis W Contrast  Result Date: 05/02/2020 CLINICAL DATA:  Nausea, vomiting, abdominal pain. EXAM: CT ABDOMEN AND PELVIS WITH CONTRAST TECHNIQUE: Multidetector CT imaging of the abdomen and pelvis was performed using the standard protocol following bolus administration of intravenous contrast. CONTRAST:  58m OMNIPAQUE IOHEXOL 300 MG/ML  SOLN COMPARISON:  07/07/2018 FINDINGS: Lower chest: Pacer wires noted in the right heart. No acute abnormality. Hepatobiliary: Gallbladder is distended with mild wall thickening. Appearance is concerning for possible cholecystitis. Suspect small layering stones. No biliary ductal dilatation. Diffuse fatty infiltration of the liver without focal hepatic abnormality. Pancreas: No focal abnormality or ductal dilatation. Spleen: No focal abnormality.  Normal size. Adrenals/Urinary Tract: Numerous bilateral renal cysts. No hydronephrosis. Adrenal glands unremarkable. Urinary bladder is trabeculated with numerous diverticula. Stomach/Bowel: Large stool burden in the rectosigmoid colon. Left colonic diverticulosis. No active diverticulitis. Stomach and small bowel decompressed, unremarkable. Vascular/Lymphatic: Aortic atherosclerosis. No aneurysm or adenopathy. Reproductive: Prostate  enlargement with calcifications. Probable radiation seeds in the prostate. Other: No free fluid or free air. Musculoskeletal: Diffuse degenerative disc and facet disease. No acute bony abnormality or focal bone lesion. IMPRESSION: Gallbladder is distended with mild apparent wall thickening. Probable small layering stones. Findings concerning for cholecystitis. This could be further evaluated with right upper quadrant ultrasound. Fatty infiltration of the liver. Prostate enlargement with trabeculated bladder and numerous bladder wall diverticula. No hydronephrosis. Aortic atherosclerosis. Electronically Signed   By: KRolm BaptiseM.D.   On: 05/02/2020 15:29      IMPRESSION AND PLAN:   1.  Acute cholecystitis with mild sepsis without severe sepsis or septic shock.  This is manifested by significant leukocytosis and tachypnea. -The patient is directly admitted to a medical monitored bed. -He will be kept n.p.o. -Pain management will be provided. -He will be hydrated with IV normal saline. -We will stop his Eliquis. -He was evaluated by Dr. BVerdis Fredericksonwho is planning on percutaneous drainage in a.m. -We will place the patient on IV  cefepime for now.  2.  Acute kidney injury. -The patient will be hydrated with IV normal saline. -We will follow serial BMPs. -Nephrotoxins will be held off.  3.  Chronic atrial fibrillation, status post pacemaker placement. -We will continue Toprol-XL and hold off Eliquis.  4.  Dyslipidemia with significantly elevated LFTs likely related to acute hepatitis. -We will stop Crestor for now as well as fish oil, given elevated LFTs. -He will be hydrated and LFTs will be followed.  5.  BPH. -Flomax will be resumed.  6.  History of gout. -We will place the patient on colchicine prophylactically as he had previous gouty arthritis after getting CT scans.  7.  DVT prophylaxis. -SCDs. -The patient's INR is currently 2 and his Eliquis was held off.  All the records are  reviewed and case discussed with ED provider. The plan of care was discussed in details with the patient (and family). I answered all questions. The patient agreed to proceed with the above mentioned plan. Further management will depend upon hospital course.   CODE STATUS: Full code  Status is: Inpatient  Remains inpatient appropriate because:Ongoing active pain requiring inpatient pain management, Ongoing diagnostic testing needed not appropriate for outpatient work up, Unsafe d/c plan, IV treatments appropriate due to intensity of illness or inability to take PO and Inpatient level of care appropriate due to severity of illness   Dispo: The patient is from: Home              Anticipated d/c is to: Home              Anticipated d/c date is: 3 days              Patient currently is not medically stable to d/c.    TOTAL TIME TAKING CARE OF THIS PATIENT: 55 minutes.    Christel Mormon M.D on 05/02/2020 at 7:36 PM  [facility]   From 7 PM-7 AM, contact night-coverage [email or web address]  CC: Primary care physician; Steele Sizer, MD   Note: This dictation was prepared with Dragon dictation along with smaller phrase technology. Any transcriptional errors that result from this process are unintentional.

## 2020-05-03 ENCOUNTER — Inpatient Hospital Stay: Payer: Medicare Other

## 2020-05-03 ENCOUNTER — Encounter: Payer: Self-pay | Admitting: Family Medicine

## 2020-05-03 ENCOUNTER — Other Ambulatory Visit: Payer: Self-pay

## 2020-05-03 LAB — COMPREHENSIVE METABOLIC PANEL
ALT: 536 U/L — ABNORMAL HIGH (ref 0–44)
AST: 420 U/L — ABNORMAL HIGH (ref 15–41)
Albumin: 2.8 g/dL — ABNORMAL LOW (ref 3.5–5.0)
Alkaline Phosphatase: 142 U/L — ABNORMAL HIGH (ref 38–126)
Anion gap: 6 (ref 5–15)
BUN: 32 mg/dL — ABNORMAL HIGH (ref 8–23)
CO2: 24 mmol/L (ref 22–32)
Calcium: 8 mg/dL — ABNORMAL LOW (ref 8.9–10.3)
Chloride: 101 mmol/L (ref 98–111)
Creatinine, Ser: 1.82 mg/dL — ABNORMAL HIGH (ref 0.61–1.24)
GFR calc Af Amer: 37 mL/min — ABNORMAL LOW (ref >60)
GFR calc non Af Amer: 32 mL/min — ABNORMAL LOW (ref >60)
Glucose, Bld: 125 mg/dL — ABNORMAL HIGH (ref 70–99)
Potassium: 4.1 mmol/L (ref 3.5–5.1)
Sodium: 131 mmol/L — ABNORMAL LOW (ref 135–145)
Total Bilirubin: 5.5 mg/dL — ABNORMAL HIGH (ref 0.3–1.2)
Total Protein: 5.4 g/dL — ABNORMAL LOW (ref 6.5–8.1)

## 2020-05-03 LAB — CBC
HCT: 39.1 % (ref 39.0–52.0)
Hemoglobin: 13.8 g/dL (ref 13.0–17.0)
MCH: 31.6 pg (ref 26.0–34.0)
MCHC: 35.3 g/dL (ref 30.0–36.0)
MCV: 89.5 fL (ref 80.0–100.0)
Platelets: 195 10*3/uL (ref 150–400)
RBC: 4.37 MIL/uL (ref 4.22–5.81)
RDW: 14.2 % (ref 11.5–15.5)
WBC: 23 10*3/uL — ABNORMAL HIGH (ref 4.0–10.5)
nRBC: 0 % (ref 0.0–0.2)

## 2020-05-03 LAB — TROPONIN I (HIGH SENSITIVITY): Troponin I (High Sensitivity): 48 ng/L — ABNORMAL HIGH (ref <18)

## 2020-05-03 LAB — MAGNESIUM: Magnesium: 1.5 mg/dL — ABNORMAL LOW (ref 1.7–2.4)

## 2020-05-03 LAB — SARS CORONAVIRUS 2 BY RT PCR (HOSPITAL ORDER, PERFORMED IN ~~LOC~~ HOSPITAL LAB): SARS Coronavirus 2: NEGATIVE

## 2020-05-03 MED ORDER — FENTANYL CITRATE (PF) 100 MCG/2ML IJ SOLN
INTRAMUSCULAR | Status: AC | PRN
  Administered 2020-05-03: 25 ug via INTRAVENOUS

## 2020-05-03 MED ORDER — COLCHICINE 0.6 MG PO TABS
0.6000 mg | ORAL_TABLET | Freq: Every day | ORAL | Status: DC
  Administered 2020-05-03 – 2020-05-04 (×2): 0.6 mg via ORAL
  Filled 2020-05-03 (×2): qty 1

## 2020-05-03 MED ORDER — MIDAZOLAM HCL 2 MG/2ML IJ SOLN
INTRAMUSCULAR | Status: AC
  Filled 2020-05-03: qty 2

## 2020-05-03 MED ORDER — MIDAZOLAM HCL 2 MG/2ML IJ SOLN
INTRAMUSCULAR | Status: AC | PRN
  Administered 2020-05-03: 0.5 mg via INTRAVENOUS

## 2020-05-03 MED ORDER — SODIUM CHLORIDE 0.9 % IV SOLN
2.0000 g | INTRAVENOUS | Status: DC
  Administered 2020-05-03 – 2020-05-04 (×2): 2 g via INTRAVENOUS
  Filled 2020-05-03 (×3): qty 2

## 2020-05-03 MED ORDER — FENTANYL CITRATE (PF) 100 MCG/2ML IJ SOLN
INTRAMUSCULAR | Status: AC
  Filled 2020-05-03: qty 2

## 2020-05-03 MED ORDER — METRONIDAZOLE IN NACL 5-0.79 MG/ML-% IV SOLN
500.0000 mg | Freq: Three times a day (TID) | INTRAVENOUS | Status: DC
  Administered 2020-05-03 – 2020-05-11 (×26): 500 mg via INTRAVENOUS
  Filled 2020-05-03 (×28): qty 100

## 2020-05-03 MED ORDER — SODIUM CHLORIDE 0.9% FLUSH
5.0000 mL | Freq: Three times a day (TID) | INTRAVENOUS | Status: DC
  Administered 2020-05-03 – 2020-05-14 (×32): 5 mL

## 2020-05-03 NOTE — Progress Notes (Signed)
Tmax 99.2, VSS. Slept well last night per daughter, Lorraine's report. Labs reviewed: Evidence of biliary obstruction. Case discussed with  [name], MD from GI. Patient needs to be off Eiliquis for two days prior to ERCP/ sphincterotomy.  Stent placement alone high risk for pancreatitis. Reviewing history, likely symptoms from biliary obstruction (CBD ) from the beginning. Suspect cystic duct is patent with minimal evidence of cholecystitis on CT.  Will review plans for U/S and CT drainage of the gallbladder. Hopefully, this will decompress the biliary tree and ERCP can be completed (if needed) at a later date.

## 2020-05-03 NOTE — Consult Note (Signed)
Pharmacy Antibiotic Note  Jose Castro is a 86 y.o. male admitted on 05/02/2020 with cholecystitis.  Pharmacy has been consulted for cefepime dosing. The patient confirms to me this morning a history of rash to penicillin but is tolerating the cefepime well. His renal function is worse than his baseline level currently  Plan:    Cefepime 2 grams IV every 24 hours  Height: 5\' 11"  (180.3 cm) Weight: 78.4 kg (172 lb 13.5 oz) IBW/kg (Calculated) : 75.3  Temp (24hrs), Avg:98.7 F (37.1 C), Min:98 F (36.7 C), Max:99.9 F (37.7 C)  Recent Labs  Lab 05/01/20 1701 05/02/20 1225 05/02/20 2050 05/03/20 0555  WBC 11.4* 24.2*  --  23.0*  CREATININE 1.11  --  1.59* 1.82*    Estimated Creatinine Clearance: 28.2 mL/min (A) (by C-G formula based on SCr of 1.82 mg/dL (H)).    Allergies  Allergen Reactions  . Penicillins Itching and Swelling    Has patient had a PCN reaction causing immediate rash, facial/tongue/throat swelling, SOB or lightheadedness with hypotension: No Has patient had a PCN reaction causing severe rash involving mucus membranes or skin necrosis: No Has patient had a PCN reaction that required hospitalization: No Has patient had a PCN reaction occurring within the last 10 years: No If all of the above answers are "NO", then may proceed with Cephalosporin use.     Antimicrobials this admission: ciprofloxacin 6/2 x 1  cefepime 6/1 >>  Metronidazole 6/2 >>  Microbiology results: None pending or resulted  Thank you for allowing pharmacy to be a part of this patient's care.  Dallie Piles 05/03/2020 8:03 AM

## 2020-05-03 NOTE — Procedures (Signed)
Interventional Radiology Procedure Note  Procedure: Percutaneous cholecystostomy  Complications: None  Estimated Blood Loss: < 10 mL  Findings: 10 Fr drain placed into GB and attached to gravity bag drainage. Bile sample sent for culture.  Venetia Night. Kathlene Cote, M.D Pager:  [phone]

## 2020-05-03 NOTE — Progress Notes (Signed)
Patient clinically stable post Cholecystostomy tube placement per Dr Kathlene Cote.dark bile drainage noted to gravity bag. Dressing dry and intact. Denies complaints post procedure. Received Versed 0.5mg  along with Fentanyl 83mcg IV for procedure. Report given to [facility] post procedure with question answered.

## 2020-05-03 NOTE — Progress Notes (Signed)
Afebrile, VSS. Tolerated percutaneous drainage well. Bile color clearing from dark green to golden, hopefully signifying patency of the cystic duct. Tolerating liquids. Would hold diet advance until AM labs reviewed. Will encourage ambulation to maintain strength.

## 2020-05-03 NOTE — Progress Notes (Signed)
PROGRESS NOTE    Jose Castro  VEL:[id number] DOB: 09-02-1926 DOA: 05/02/2020 PCP: Steele Sizer, MD   Brief Narrative:  Jose Castro  is a 86 y.o. male with a known history of hypertension, dyslipidemia, BPH, CVA, GERD and osteoarthritis who was directly admitted for epigastric pain and nausea with dry heaves since last night and that was preceded by an ER visit for substernal chest pain yesterday when he was ruled out ACS and discharged.  Today he was seen by his primary care physician who discussed the case with Dr. Tollie Pizza for direct admission. CT abdomen with gallbladder distention and thickening with probable small layering of stones.  Findings were concerning for cholecystitis.  CMP with transaminitis and elevated T bili,  concerning for obstructive jaundice. GI was also consulted for ERCP but patient has to be off from Eliquis for more than 48 hours for procedure.  Cholecystostomy tube was placed by IR today.  Subjective: Patient continued to experience mild right upper quadrant discomfort.  Denies any more nausea or vomiting.  Assessment & Plan:   Active Problems:   Acute cholecystitis  Acute cholecystitis.  Patient afebrile with mild improvement in leukocytosis today.  He was going for cholecystostomy tube placement by IR. Labs are concerning for obstruction-will need ERCP after 48 hours off from Eliquis. Surgery is following-appreciate their recommendations. -Continue cefepime. -Add Flagyl.  AKI.  Patient appears dry. -IV hydration. -Continue to monitor. -Avoid nephrotoxins  Chronic atrial fibrillation, status post pacemaker placement. -We will continue Toprol-XL and hold off Eliquis.    Dyslipidemia with significantly elevated LFTs likely related to acute hepatitis. -We will stop Crestor for now as well as fish oil, given elevated LFTs. -He will be hydrated and LFTs will be followed.   BPH. -Flomax will be resumed.    History of gout. -We will place the  patient on colchicine prophylactically as he had previous gouty arthritis after getting CT scans.   Objective: Vitals:   05/03/20 1225 05/03/20 1230 05/03/20 1245 05/03/20 1307  BP: 110/68 (!) 149/75 (!) 142/74 131/65  Pulse: (!) 59 (!) 59 61 60  Resp: (!) 27 (!) 21 20 18   Temp:    98.2 F (36.8 C)  TempSrc:    Oral  SpO2: 93% 93% 93% 90%  Weight:      Height:        Intake/Output Summary (Last 24 hours) at 05/03/2020 1520 Last data filed at 05/02/2020 2329 Gross per 24 hour  Intake 200 ml  Output 350 ml  Net -150 ml   Filed Weights   05/03/20 0218  Weight: 78.4 kg    Examination:  General exam: Appears calm and comfortable  Respiratory system: Clear to auscultation. Respiratory effort normal. Cardiovascular system: S1 & S2 heard, RRR. No JVD, murmurs, rubs, gallops or clicks. Gastrointestinal system: Soft, mild right upper quadrant tenderness, nondistended, bowel sounds positive. Central nervous system: Alert and oriented. No focal neurological deficits. Extremities: No edema, no cyanosis, pulses intact and symmetrical. Skin: No rashes, lesions or ulcers Psychiatry: Judgement and insight appear normal.   DVT prophylaxis: SCDs Code Status: Full Family Communication: Daughter was updated at bedside. Disposition Plan:  Status is: Inpatient  Remains inpatient appropriate because:IV treatments appropriate due to intensity of illness or inability to take PO   Dispo: The patient is from: Home              Anticipated d/c is to: Home  Anticipated d/c date is: 3 days              Patient currently is not medically stable to d/c.   Consultants:   Surgery  IR  GI  Procedures:  IR guided Cholicystostomy tube placement.  Antimicrobials:  Cefepime Flagyl  Data Reviewed: I have personally reviewed following labs and imaging studies  CBC: Recent Labs  Lab 05/01/20 1701 05/02/20 1225 05/03/20 0555  WBC 11.4* 24.2* 23.0*  NEUTROABS  --  21,659*   --   HGB 15.8 14.8 13.8  HCT 46.7 44.2 39.1  MCV 92.3 93.4 89.5  PLT 287 247 366   Basic Metabolic Panel: Recent Labs  Lab 05/01/20 1701 05/02/20 2050 05/03/20 0555  NA 143 134* 131*  K 3.5 4.1 4.1  CL 108 100 101  CO2 26 24 24   GLUCOSE 92 104* 125*  BUN 18 30* 32*  CREATININE 1.11 1.59* 1.82*  CALCIUM 9.0 8.5* 8.0*  MG  --   --  1.5*   GFR: Estimated Creatinine Clearance: 28.2 mL/min (A) (by C-G formula based on SCr of 1.82 mg/dL (H)). Liver Function Tests: Recent Labs  Lab 05/02/20 2050 05/03/20 0555  AST 659* 420*  ALT 690* 536*  ALKPHOS 162* 142*  BILITOT 6.0* 5.5*  PROT 5.8* 5.4*  ALBUMIN 3.2* 2.8*   Recent Labs  Lab 05/01/20 1701  LIPASE 46   No results for input(s): AMMONIA in the last 168 hours. Coagulation Profile: Recent Labs  Lab 05/02/20 1953  INR 2.0*   Cardiac Enzymes: No results for input(s): CKTOTAL, CKMB, CKMBINDEX, TROPONINI in the last 168 hours. BNP (last 3 results) No results for input(s): PROBNP in the last 8760 hours. HbA1C: No results for input(s): HGBA1C in the last 72 hours. CBG: No results for input(s): GLUCAP in the last 168 hours. Lipid Profile: No results for input(s): CHOL, HDL, LDLCALC, TRIG, CHOLHDL, LDLDIRECT in the last 72 hours. Thyroid Function Tests: No results for input(s): TSH, T4TOTAL, FREET4, T3FREE, THYROIDAB in the last 72 hours. Anemia Panel: No results for input(s): VITAMINB12, FOLATE, FERRITIN, TIBC, IRON, RETICCTPCT in the last 72 hours. Sepsis Labs: No results for input(s): PROCALCITON, LATICACIDVEN in the last 168 hours.  Recent Results (from the past 240 hour(s))  SARS Coronavirus 2 by RT PCR (hospital order, performed in [facility] hospital lab) Nasopharyngeal Nasopharyngeal Swab     Status: None   Collection Time: 05/03/20 10:25 AM   Specimen: Nasopharyngeal Swab  Result Value Ref Range Status   SARS Coronavirus 2 NEGATIVE NEGATIVE Final    Comment: (NOTE) SARS-CoV-2 target nucleic acids are  NOT DETECTED. The SARS-CoV-2 RNA is generally detectable in upper and lower respiratory specimens during the acute phase of infection. The lowest concentration of SARS-CoV-2 viral copies this assay can detect is 250 copies / mL. A negative result does not preclude SARS-CoV-2 infection and should not be used as the sole basis for treatment or other patient management decisions.  A negative result may occur with improper specimen collection / handling, submission of specimen other than nasopharyngeal swab, presence of viral mutation(s) within the areas targeted by this assay, and inadequate number of viral copies (<250 copies / mL). A negative result must be combined with clinical observations, patient history, and epidemiological information. Fact Sheet for Patients:   StrictlyIdeas.no Fact Sheet for Healthcare Providers: BankingDealers.co.za This test is not yet approved or cleared  by the [place] FDA and has been authorized for detection and/or diagnosis of SARS-CoV-2 by FDA  under an Emergency Use Authorization (EUA).  This EUA will remain in effect (meaning this test can be used) for the duration of the COVID-19 declaration under Section 564(b)(1) of the Act, 21 U.S.C. section 360bbb-3(b)(1), unless the authorization is terminated or revoked sooner. Performed at [facility], [address], [place], [place] [address]      Radiology Studies: DG Chest 2 View  Result Date: 05/01/2020 CLINICAL DATA:  Central chest pain.  Nausea. EXAM: CHEST - 2 VIEW COMPARISON:  06/01/2019 FINDINGS: Moderate thoracic spondylosis. Pacer with leads at right atrium and right ventricle. No lead discontinuity. Midline trachea. Normal heart size. Atherosclerosis in the transverse aorta. Tortuous thoracic aorta. No pleural effusion or pneumothorax. Clear lungs. IMPRESSION: No acute cardiopulmonary disease. Aortic Atherosclerosis (ICD10-I70.0).  Electronically Signed   By: Abigail Miyamoto M.D.   On: 05/01/2020 17:34   CT Abdomen Pelvis W Contrast  Result Date: 05/02/2020 CLINICAL DATA:  Nausea, vomiting, abdominal pain. EXAM: CT ABDOMEN AND PELVIS WITH CONTRAST TECHNIQUE: Multidetector CT imaging of the abdomen and pelvis was performed using the standard protocol following bolus administration of intravenous contrast. CONTRAST:  78mL OMNIPAQUE IOHEXOL 300 MG/ML  SOLN COMPARISON:  07/07/2018 FINDINGS: Lower chest: Pacer wires noted in the right heart. No acute abnormality. Hepatobiliary: Gallbladder is distended with mild wall thickening. Appearance is concerning for possible cholecystitis. Suspect small layering stones. No biliary ductal dilatation. Diffuse fatty infiltration of the liver without focal hepatic abnormality. Pancreas: No focal abnormality or ductal dilatation. Spleen: No focal abnormality.  Normal size. Adrenals/Urinary Tract: Numerous bilateral renal cysts. No hydronephrosis. Adrenal glands unremarkable. Urinary bladder is trabeculated with numerous diverticula. Stomach/Bowel: Large stool burden in the rectosigmoid colon. Left colonic diverticulosis. No active diverticulitis. Stomach and small bowel decompressed, unremarkable. Vascular/Lymphatic: Aortic atherosclerosis. No aneurysm or adenopathy. Reproductive: Prostate enlargement with calcifications. Probable radiation seeds in the prostate. Other: No free fluid or free air. Musculoskeletal: Diffuse degenerative disc and facet disease. No acute bony abnormality or focal bone lesion. IMPRESSION: Gallbladder is distended with mild apparent wall thickening. Probable small layering stones. Findings concerning for cholecystitis. This could be further evaluated with right upper quadrant ultrasound. Fatty infiltration of the liver. Prostate enlargement with trabeculated bladder and numerous bladder wall diverticula. No hydronephrosis. Aortic atherosclerosis. Electronically Signed   By: Rolm Baptise M.D.   On: 05/02/2020 15:29   CT IMAGE GUIDED DRAINAGE BY PERCUTANEOUS CATHETER  Result Date: 05/03/2020 INDICATION: Acute cholecystitis and need for percutaneous cholecystostomy as the patient is currently not a candidate for cholecystectomy. EXAM: CT GUIDED CHOLECYSTOSTOMY TUBE PLACEMENT MEDICATIONS: No additional medications. ANESTHESIA/SEDATION: Moderate (conscious) sedation was employed during this procedure. A total of Versed 0.5 mg and Fentanyl 25 mcg was administered intravenously. Moderate Sedation Time: 12 minutes. The patient's level of consciousness and vital signs were monitored continuously by radiology nursing throughout the procedure under my direct supervision. FLUOROSCOPY TIME:  The procedure was performed under CT guidance. COMPLICATIONS: None immediate. PROCEDURE: Informed written consent was obtained from the patient after a thorough discussion of the procedural risks, benefits and alternatives. All questions were addressed. Maximal Sterile Barrier Technique was utilized including caps, mask, sterile gowns, sterile gloves, sterile drape, hand hygiene and skin antiseptic. A timeout was performed prior to the initiation of the procedure. CT was performed through the upper to mid abdomen in a supine position. Under CT guidance, an 18 gauge trocar needle was advanced into the gallbladder lumen. Aspiration of bile was performed. A guidewire was advanced through the needle and the  needle removed. The tract was dilated over the guidewire and a 10 French percutaneous drainage catheter advanced into the gallbladder lumen. Catheter position was confirmed by CT. Additional bile sample was aspirated and sent for culture analysis. The catheter was connected to a gravity drainage bag and secured at the skin with a Prolene retention suture and StatLock device. FINDINGS: The gallbladder shows significant distension and there is increased prominence of inflammation around the gallbladder and gallbladder  wall edema compared to the CT study yesterday. The cholecystostomy tube is draining dark bile after placement. IMPRESSION: CT-guided placement of percutaneous cholecystostomy tube. A 10 French drain was placed into the gallbladder lumen and attached to gravity bag drainage. A sample of bile aspirated directly from the gallbladder was sent for culture analysis. Electronically Signed   By: Aletta Edouard M.D.   On: 05/03/2020 13:37   US Abdomen Limited RUQ  Result Date: 05/03/2020 CLINICAL DATA:  [place] EXAM: ULTRASOUND ABDOMEN LIMITED RIGHT UPPER QUADRANT COMPARISON:  Abdominal CT yesterday. FINDINGS: Gallbladder: Prominently distended spanning greater than 12 cm in length and 7 cm in transverse dimension. Mild wall thickening at 3.3 mm. No visualized gallstones or intraluminal sludge. No sonographic Murphy sign noted by sonographer. Common bile duct: Diameter: 5 mm, normal. Liver: No focal lesion identified. Heterogeneous in parenchymal echogenicity. Portal vein is patent on color Doppler imaging with normal direction of blood flow towards the liver. Other: Small amount of perihepatic/pericholecystic ascites/free fluid. IMPRESSION: 1. Distended gallbladder with mild wall thickening. No visualized gallstones. 2. No biliary dilatation. 3. Small amount of right upper quadrant free fluid/ascites. Electronically Signed   By: Keith Rake M.D.   On: 05/03/2020 11:48    Scheduled Meds: . cholecalciferol  2,000 Units Oral Daily  . colchicine  0.6 mg Oral Daily  . fentaNYL      . finasteride  5 mg Oral Daily  . metoprolol succinate  25 mg Oral Daily  . midazolam      . pantoprazole  40 mg Oral Daily  . sodium chloride flush  5 mL Intracatheter Q8H   Continuous Infusions: . ceFEPime (MAXIPIME) IV    . dextrose 5 % and 0.45 % NaCl with KCl 20 mEq/L 100 mL/hr at 05/02/20 2145  . metronidazole 500 mg (05/03/20 1009)     LOS: 1 day   Time spent: 40 minutes.  Lorella Nimrod, MD [facility]  If 7PM-7AM, please contact night-coverage [email or web address]  05/03/2020, 3:20 PM   This record has been created using Systems analyst. Errors have been sought and corrected,but may not always be located. Such creation errors do not reflect on the standard of care.

## 2020-05-04 ENCOUNTER — Other Ambulatory Visit: Payer: Self-pay | Admitting: Radiology

## 2020-05-04 ENCOUNTER — Inpatient Hospital Stay: Payer: Medicare Other

## 2020-05-04 DIAGNOSIS — K819 Cholecystitis, unspecified: Secondary | ICD-10-CM

## 2020-05-04 HISTORY — PX: IR EXCHANGE BILIARY DRAIN: IMG6046

## 2020-05-04 LAB — BASIC METABOLIC PANEL
Anion gap: 8 (ref 5–15)
BUN: 37 mg/dL — ABNORMAL HIGH (ref 8–23)
CO2: 22 mmol/L (ref 22–32)
Calcium: 7.8 mg/dL — ABNORMAL LOW (ref 8.9–10.3)
Chloride: 104 mmol/L (ref 98–111)
Creatinine, Ser: 2.17 mg/dL — ABNORMAL HIGH (ref 0.61–1.24)
GFR calc Af Amer: 30 mL/min — ABNORMAL LOW (ref >60)
GFR calc non Af Amer: 26 mL/min — ABNORMAL LOW (ref >60)
Glucose, Bld: 134 mg/dL — ABNORMAL HIGH (ref 70–99)
Potassium: 4 mmol/L (ref 3.5–5.1)
Sodium: 134 mmol/L — ABNORMAL LOW (ref 135–145)

## 2020-05-04 LAB — HEPATIC FUNCTION PANEL
ALT: 321 U/L — ABNORMAL HIGH (ref 0–44)
AST: 177 U/L — ABNORMAL HIGH (ref 15–41)
Albumin: 2.5 g/dL — ABNORMAL LOW (ref 3.5–5.0)
Alkaline Phosphatase: 164 U/L — ABNORMAL HIGH (ref 38–126)
Bilirubin, Direct: 2.8 mg/dL — ABNORMAL HIGH (ref 0.0–0.2)
Indirect Bilirubin: 1.9 mg/dL — ABNORMAL HIGH (ref 0.3–0.9)
Total Bilirubin: 4.7 mg/dL — ABNORMAL HIGH (ref 0.3–1.2)
Total Protein: 5.1 g/dL — ABNORMAL LOW (ref 6.5–8.1)

## 2020-05-04 LAB — CBC WITH DIFFERENTIAL/PLATELET
Abs Immature Granulocytes: 0.18 10*3/uL — ABNORMAL HIGH (ref 0.00–0.07)
Basophils Absolute: 0 10*3/uL (ref 0.0–0.1)
Basophils Relative: 0 %
Eosinophils Absolute: 0 10*3/uL (ref 0.0–0.5)
Eosinophils Relative: 0 %
HCT: 39.2 % (ref 39.0–52.0)
Hemoglobin: 13.2 g/dL (ref 13.0–17.0)
Immature Granulocytes: 1 %
Lymphocytes Relative: 6 %
Lymphs Abs: 0.7 10*3/uL (ref 0.7–4.0)
MCH: 30.6 pg (ref 26.0–34.0)
MCHC: 33.7 g/dL (ref 30.0–36.0)
MCV: 91 fL (ref 80.0–100.0)
Monocytes Absolute: 0.9 10*3/uL (ref 0.1–1.0)
Monocytes Relative: 6 %
Neutro Abs: 11.4 10*3/uL — ABNORMAL HIGH (ref 1.7–7.7)
Neutrophils Relative %: 87 %
Platelets: 167 10*3/uL (ref 150–400)
RBC: 4.31 MIL/uL (ref 4.22–5.81)
RDW: 14.3 % (ref 11.5–15.5)
WBC: 13.2 10*3/uL — ABNORMAL HIGH (ref 4.0–10.5)
nRBC: 0 % (ref 0.0–0.2)

## 2020-05-04 MED ORDER — OXYCODONE HCL 5 MG PO TABS
5.0000 mg | ORAL_TABLET | ORAL | Status: DC | PRN
  Administered 2020-05-04: 5 mg via ORAL
  Filled 2020-05-04: qty 1

## 2020-05-04 MED ORDER — LACTATED RINGERS IV SOLN: INTRAVENOUS | Status: DC

## 2020-05-04 NOTE — Procedures (Signed)
Interventional Radiology Procedure Note  Procedure: Cholecystostomy tube exchange  Complications: None  Estimated Blood Loss: None  Findings: Disrupted tube removed over wire. Saved for further investigation.  New 10 Fr drain (different brand) placed over wire and formed in gallbladder lumen. Attached to gravity bag drainage.  Venetia Night. Kathlene Cote, M.D Pager:  [phone]

## 2020-05-04 NOTE — Progress Notes (Signed)
Supervising Physician: Aletta Edouard  Patient Status:  [facility] - In-pt  Chief Complaint:  Acute cholecystitis   Subjective:  86 y.o, male inpatient. History of HTN, CVA admitted for epigastric pain and nausea found acute cholecystitis. IR placed a cholecystomy tube on 6.3.21. Tube was found to be broken around the circumference at the  connection between the drain and the hub. Patient denies any nausea, vomiting or abdominal pain at this. Patient to be brought to IR for cholecystomy tube exchange.   Allergies: Penicillins  Medications: Prior to Admission medications   Medication Sig Start Date End Date Taking? Authorizing Provider  acetaminophen (TYLENOL) 325 MG tablet Take 2 tablets (650 mg total) by mouth every 4 (four) hours as needed for mild pain (or temp > 37.5 C (99.5 F)). Patient taking differently: Take 650 mg by mouth as needed for mild pain (or temp > 37.5 C (99.5 F)).  06/07/19   Angiulli, Lavon Paganini, PA-C  apixaban (ELIQUIS) 2.5 MG TABS tablet Take 1 tablet (2.5 mg total) by mouth 2 (two) times daily. 06/07/19   Angiulli, Lavon Paganini, PA-C  Cholecalciferol (VITAMIN D3) 50 MCG (2000 UT) TABS Take 2,000 Units by mouth daily. 06/07/19   Angiulli, Lavon Paganini, PA-C  finasteride (PROSCAR) 5 MG tablet Take 1 tablet (5 mg total) by mouth daily. 06/07/19   Angiulli, Lavon Paganini, PA-C  metoprolol succinate (TOPROL XL) 25 MG 24 hr tablet Take 1 tablet (25 mg total) by mouth daily. 06/07/19   Angiulli, Lavon Paganini, PA-C  Omega-3 Fatty Acids (FISH OIL) 1000 MG CAPS Take 1,000 mg by mouth daily.     [provider]  ondansetron (ZOFRAN) 4 MG tablet Take 1 tablet (4 mg total) by mouth every 8 (eight) hours as needed for nausea or vomiting. 05/02/20   Steele Sizer, MD  pantoprazole (PROTONIX) 40 MG tablet Take 1 tablet (40 mg total) by mouth daily. 06/07/19   Angiulli, Lavon Paganini, PA-C  rosuvastatin (CRESTOR) 40 MG tablet Take 1 tablet (40 mg total) by mouth daily at 6 PM. 06/07/19   Angiulli, Lavon Paganini, PA-C     Vital Signs: BP (!) 147/80 (BP Location: Right Arm)   Pulse (!) 59   Temp 98.4 F (36.9 C) (Oral)   Resp 15   Ht 5\' 11"  (1.803 m)   Wt 172 lb 13.5 oz (78.4 kg)   SpO2 90%   BMI 24.11 kg/m   Physical Exam Vitals and nursing note reviewed.  Constitutional:      Appearance: He is well-developed.  HENT:     Head: Normocephalic.  Pulmonary:     Effort: Pulmonary effort is normal.  Abdominal:     Comments: Cholecystomy tube broken at the hub circumferentially. Retention sutures can be visualized. Suture and Stat lock in place.   Musculoskeletal:        General: Normal range of motion.     Cervical back: Normal range of motion.  Skin:    General: Skin is dry.  Neurological:     Mental Status: He is alert and oriented to person, place, and time.     Imaging: DG Chest 2 View  Result Date: 05/01/2020 CLINICAL DATA:  Central chest pain.  Nausea. EXAM: CHEST - 2 VIEW COMPARISON:  06/01/2019 FINDINGS: Moderate thoracic spondylosis. Pacer with leads at right atrium and right ventricle. No lead discontinuity. Midline trachea. Normal heart size. Atherosclerosis in the transverse aorta. Tortuous thoracic aorta. No pleural effusion or pneumothorax. Clear lungs. IMPRESSION:  No acute cardiopulmonary disease. Aortic Atherosclerosis (ICD10-I70.0). Electronically Signed   By: Abigail Miyamoto M.D.   On: 05/01/2020 17:34   CT Abdomen Pelvis W Contrast  Result Date: 05/02/2020 CLINICAL DATA:  Nausea, vomiting, abdominal pain. EXAM: CT ABDOMEN AND PELVIS WITH CONTRAST TECHNIQUE: Multidetector CT imaging of the abdomen and pelvis was performed using the standard protocol following bolus administration of intravenous contrast. CONTRAST:  60mL OMNIPAQUE IOHEXOL 300 MG/ML  SOLN COMPARISON:  07/07/2018 FINDINGS: Lower chest: Pacer wires noted in the right heart. No acute abnormality. Hepatobiliary: Gallbladder is distended with mild wall thickening. Appearance is concerning for possible  cholecystitis. Suspect small layering stones. No biliary ductal dilatation. Diffuse fatty infiltration of the liver without focal hepatic abnormality. Pancreas: No focal abnormality or ductal dilatation. Spleen: No focal abnormality.  Normal size. Adrenals/Urinary Tract: Numerous bilateral renal cysts. No hydronephrosis. Adrenal glands unremarkable. Urinary bladder is trabeculated with numerous diverticula. Stomach/Bowel: Large stool burden in the rectosigmoid colon. Left colonic diverticulosis. No active diverticulitis. Stomach and small bowel decompressed, unremarkable. Vascular/Lymphatic: Aortic atherosclerosis. No aneurysm or adenopathy. Reproductive: Prostate enlargement with calcifications. Probable radiation seeds in the prostate. Other: No free fluid or free air. Musculoskeletal: Diffuse degenerative disc and facet disease. No acute bony abnormality or focal bone lesion. IMPRESSION: Gallbladder is distended with mild apparent wall thickening. Probable small layering stones. Findings concerning for cholecystitis. This could be further evaluated with right upper quadrant ultrasound. Fatty infiltration of the liver. Prostate enlargement with trabeculated bladder and numerous bladder wall diverticula. No hydronephrosis. Aortic atherosclerosis. Electronically Signed   By: Rolm Baptise M.D.   On: 05/02/2020 15:29   CT IMAGE GUIDED DRAINAGE BY PERCUTANEOUS CATHETER  Result Date: 05/03/2020 INDICATION: Acute cholecystitis and need for percutaneous cholecystostomy as the patient is currently not a candidate for cholecystectomy. EXAM: CT GUIDED CHOLECYSTOSTOMY TUBE PLACEMENT MEDICATIONS: No additional medications. ANESTHESIA/SEDATION: Moderate (conscious) sedation was employed during this procedure. A total of Versed 0.5 mg and Fentanyl 25 mcg was administered intravenously. Moderate Sedation Time: 12 minutes. The patient's level of consciousness and vital signs were monitored continuously by radiology nursing  throughout the procedure under my direct supervision. FLUOROSCOPY TIME:  The procedure was performed under CT guidance. COMPLICATIONS: None immediate. PROCEDURE: Informed written consent was obtained from the patient after a thorough discussion of the procedural risks, benefits and alternatives. All questions were addressed. Maximal Sterile Barrier Technique was utilized including caps, mask, sterile gowns, sterile gloves, sterile drape, hand hygiene and skin antiseptic. A timeout was performed prior to the initiation of the procedure. CT was performed through the upper to mid abdomen in a supine position. Under CT guidance, an 18 gauge trocar needle was advanced into the gallbladder lumen. Aspiration of bile was performed. A guidewire was advanced through the needle and the needle removed. The tract was dilated over the guidewire and a 10 French percutaneous drainage catheter advanced into the gallbladder lumen. Catheter position was confirmed by CT. Additional bile sample was aspirated and sent for culture analysis. The catheter was connected to a gravity drainage bag and secured at the skin with a Prolene retention suture and StatLock device. FINDINGS: The gallbladder shows significant distension and there is increased prominence of inflammation around the gallbladder and gallbladder wall edema compared to the CT study yesterday. The cholecystostomy tube is draining dark bile after placement. IMPRESSION: CT-guided placement of percutaneous cholecystostomy tube. A 10 French drain was placed into the gallbladder lumen and attached to gravity bag drainage. A sample of bile aspirated directly from  the gallbladder was sent for culture analysis. Electronically Signed   By: Aletta Edouard M.D.   On: 05/03/2020 13:37   US Abdomen Limited RUQ  Result Date: 05/03/2020 CLINICAL DATA:  Hydrops EXAM: ULTRASOUND ABDOMEN LIMITED RIGHT UPPER QUADRANT COMPARISON:  Abdominal CT yesterday. FINDINGS: Gallbladder: Prominently  distended spanning greater than 12 cm in length and 7 cm in transverse dimension. Mild wall thickening at 3.3 mm. No visualized gallstones or intraluminal sludge. No sonographic Murphy sign noted by sonographer. Common bile duct: Diameter: 5 mm, normal. Liver: No focal lesion identified. Heterogeneous in parenchymal echogenicity. Portal vein is patent on color Doppler imaging with normal direction of blood flow towards the liver. Other: Small amount of perihepatic/pericholecystic ascites/free fluid. IMPRESSION: 1. Distended gallbladder with mild wall thickening. No visualized gallstones. 2. No biliary dilatation. 3. Small amount of right upper quadrant free fluid/ascites. Electronically Signed   By: Keith Rake M.D.   On: 05/03/2020 11:48    Labs:  CBC: Recent Labs    05/01/20 1701 05/02/20 1225 05/03/20 0555 05/04/20 0418  WBC 11.4* 24.2* 23.0* 13.2*  HGB 15.8 14.8 13.8 13.2  HCT 46.7 44.2 39.1 39.2  PLT 287 247 195 167    COAGS: Recent Labs    05/21/19 1735 05/02/20 1953  INR 1.1 2.0*  APTT 25  --     BMP: Recent Labs    05/01/20 1701 05/02/20 2050 05/03/20 0555 05/04/20 0418  NA 143 134* 131* 134*  K 3.5 4.1 4.1 4.0  CL 108 100 101 104  CO2 26 24 24 22   GLUCOSE 92 104* 125* 134*  BUN 18 30* 32* 37*  CALCIUM 9.0 8.5* 8.0* 7.8*  CREATININE 1.11 1.59* 1.82* 2.17*  GFRNONAA 58* 37* 32* 26*  GFRAA >60 43* 37* 30*    LIVER FUNCTION TESTS: Recent Labs    05/30/19 0727 05/30/19 0727 03/06/20 1612 05/02/20 2050 05/03/20 0555 05/04/20 0418  BILITOT 1.0   < > 0.7 6.0* 5.5* 4.7*  AST 22   < > 17 659* 420* 177*  ALT 15   < > 20 690* 536* 321*  ALKPHOS 66  --   --  162* 142* 164*  PROT 5.9*   < > 6.1 5.8* 5.4* 5.1*  ALBUMIN 2.7*  --   --  3.2* 2.8* 2.5*   < > = values in this interval not displayed.    Assessment and Plan:  86 y.o, male inpatient. History of HTN, CVA admitted for epigastric pain and nausea found acute cholecystitis. IR placed a cholecystomy  tube on 6.3.21. Tube was found to be broken around the circumference at the  connection between the drain and the hub. Patient denies any nausea, vomiting or abdominal pain at this. Patient to be brought to IR for cholecystomy tube exchange.  Pertinent Imaging None since placmeent  Pertinent IR History 6.3.21 - Placement cholecystomy tube  Pertinent Allergies PCN  WBC is 13.2, Cr 2.17, BUN 37 Total bilirubin 4.7  All labs and medications are within acceptable parameters. Patient is afebrile.  Case has been reviewed and procedure approved by Dr. Kathlene Cote.  Patient tentatively scheduled for 6.4.21.  IR will call patient when ready.  Patient and daughter aware and in agreement with the plan of care.     Electronically Signed: Avel Peace, NP 05/04/2020, 3:15 PM

## 2020-05-04 NOTE — Care Management Important Message (Signed)
Important Message  Patient Details  Name: Jose Castro MRN: [id number] Date of Birth: February 02, 1926   Medicare Important Message Given:  Yes  Initial Medicare IM given by Patient Access Associate on 05/03/2020 at 12:58pm.     Dannette Barbara 05/04/2020, 8:39 AM

## 2020-05-04 NOTE — Progress Notes (Signed)
AVSS. Feeling well. Coughing up some phlegm. ABD: Soft. Drain: [employer] bile.  150 cc overnight. Labs: TB down to 4.7 from 5.5.  ID bili: 1.9. Transaminases and alk phos down significantly. WBC down to 13.2, still w/ left shift. HGB stable.  Electrolytes better. IMP: Doing well.  Plan: Trial diet advance.  Continue IV antibiotics until WBC normal.

## 2020-05-04 NOTE — Progress Notes (Signed)
PROGRESS NOTE    RED MANDT  XBD:[id number] DOB: December 27, 1925 DOA: 05/02/2020 PCP: Steele Sizer, MD   Brief Narrative:  Jose Castro  is a 86 y.o. male with a known history of hypertension, dyslipidemia, BPH, CVA, GERD and osteoarthritis who was directly admitted for epigastric pain and nausea with dry heaves since last night and that was preceded by an ER visit for substernal chest pain yesterday when he was ruled out ACS and discharged.  Today he was seen by his primary care physician who discussed the case with Dr. Tollie Pizza for direct admission. CT abdomen with gallbladder distention and thickening with probable small layering of stones.  Findings were concerning for cholecystitis.  CMP with transaminitis and elevated T bili,  concerning for obstructive jaundice. GI was also consulted for ERCP but patient has to be off from Eliquis for more than 48 hours for procedure.  Cholecystostomy tube was placed by IR today.  Subjective: Patient was complaining of right shoulder pain when seen today.  He was able to tolerate p.o. today.  No nausea or vomiting.  Assessment & Plan:   Active Problems:   Acute cholecystitis  Acute cholecystitis.  Patient afebrile with  improvement in leukocytosis today.  Patient had cystostomy tube in place by IR yesterday.  Improvement in his labs.  Tube draining yellow-colored fluid. He was experiencing some right shoulder pain most likely referred from his gallbladder. Surgery is following-appreciate their recommendations. -Continue cefepime. -Continue Flagyl.  AKI.  Patient appears dry. -IV hydration. -Continue to monitor. -Avoid nephrotoxins  Chronic atrial fibrillation, status post pacemaker placement. -We will continue Toprol-XL and hold off Eliquis.    Dyslipidemia with significantly elevated LFTs likely related to acute hepatitis. -We will stop Crestor for now as well as fish oil, given elevated LFTs. -He will be hydrated and LFTs will be  followed.   BPH. -Flomax will be resumed.    History of gout. -We will place the patient on colchicine prophylactically as he had previous gouty arthritis after getting CT scans.   Objective: Vitals:   05/04/20 1153 05/04/20 1519 05/04/20 1530 05/04/20 1538  BP: (!) 147/80 (!) 156/84    Pulse: (!) 59 64 60 60  Resp: 15 12 16    Temp: 98.4 F (36.9 C) 97.9 F (36.6 C)    TempSrc: Oral Oral    SpO2: 90% 91% (!) 86% 93%  Weight:      Height:        Intake/Output Summary (Last 24 hours) at 05/04/2020 1618 Last data filed at 05/04/2020 1448 Gross per 24 hour  Intake 3460.97 ml  Output 2085 ml  Net 1375.97 ml   Filed Weights   05/03/20 0218  Weight: 78.4 kg    Examination:  General exam: Appears calm and comfortable  Respiratory system: Clear to auscultation. Respiratory effort normal. Cardiovascular system: S1 & S2 heard, RRR. No JVD, murmurs, rubs,  Gastrointestinal system: Soft, mild right upper quadrant tenderness, nondistended, bowel sounds positive.RUQ drainage tube in place. Central nervous system: Alert and oriented. No focal neurological deficits. Extremities: No edema, no cyanosis, pulses intact and symmetrical. Skin: No rashes, lesions or ulcers Psychiatry: Judgement and insight appear normal.   DVT prophylaxis: SCDs Code Status: Full Family Communication: Daughter was updated at bedside. Disposition Plan:  Status is: Inpatient  Remains inpatient appropriate because:IV treatments appropriate due to intensity of illness or inability to take PO   Dispo: The patient is from: Home  Anticipated d/c is to: Home              Anticipated d/c date is: 3 days              Patient currently is not medically stable to d/c.   Consultants:   Surgery  IR  GI  Procedures:  IR guided Cholicystostomy tube placement.  Antimicrobials:  Cefepime Flagyl  Data Reviewed: I have personally reviewed following labs and imaging studies  CBC: Recent  Labs  Lab 05/01/20 1701 05/02/20 1225 05/03/20 0555 05/04/20 0418  WBC 11.4* 24.2* 23.0* 13.2*  NEUTROABS  --  21,659*  --  11.4*  HGB 15.8 14.8 13.8 13.2  HCT 46.7 44.2 39.1 39.2  MCV 92.3 93.4 89.5 91.0  PLT 287 247 195 706   Basic Metabolic Panel: Recent Labs  Lab 05/01/20 1701 05/02/20 2050 05/03/20 0555 05/04/20 0418  NA 143 134* 131* 134*  K 3.5 4.1 4.1 4.0  CL 108 100 101 104  CO2 26 24 24 22   GLUCOSE 92 104* 125* 134*  BUN 18 30* 32* 37*  CREATININE 1.11 1.59* 1.82* 2.17*  CALCIUM 9.0 8.5* 8.0* 7.8*  MG  --   --  1.5*  --    GFR: Estimated Creatinine Clearance: 23.6 mL/min (A) (by C-G formula based on SCr of 2.17 mg/dL (H)). Liver Function Tests: Recent Labs  Lab 05/02/20 2050 05/03/20 0555 05/04/20 0418  AST 659* 420* 177*  ALT 690* 536* 321*  ALKPHOS 162* 142* 164*  BILITOT 6.0* 5.5* 4.7*  PROT 5.8* 5.4* 5.1*  ALBUMIN 3.2* 2.8* 2.5*   Recent Labs  Lab 05/01/20 1701  LIPASE 46   No results for input(s): AMMONIA in the last 168 hours. Coagulation Profile: Recent Labs  Lab 05/02/20 1953  INR 2.0*   Cardiac Enzymes: No results for input(s): CKTOTAL, CKMB, CKMBINDEX, TROPONINI in the last 168 hours. BNP (last 3 results) No results for input(s): PROBNP in the last 8760 hours. HbA1C: No results for input(s): HGBA1C in the last 72 hours. CBG: No results for input(s): GLUCAP in the last 168 hours. Lipid Profile: No results for input(s): CHOL, HDL, LDLCALC, TRIG, CHOLHDL, LDLDIRECT in the last 72 hours. Thyroid Function Tests: No results for input(s): TSH, T4TOTAL, FREET4, T3FREE, THYROIDAB in the last 72 hours. Anemia Panel: No results for input(s): VITAMINB12, FOLATE, FERRITIN, TIBC, IRON, RETICCTPCT in the last 72 hours. Sepsis Labs: No results for input(s): PROCALCITON, LATICACIDVEN in the last 168 hours.  Recent Results (from the past 240 hour(s))  SARS Coronavirus 2 by RT PCR (hospital order, performed in [facility] hospital lab)  Nasopharyngeal Nasopharyngeal Swab     Status: None   Collection Time: 05/03/20 10:25 AM   Specimen: Nasopharyngeal Swab  Result Value Ref Range Status   SARS Coronavirus 2 NEGATIVE NEGATIVE Final    Comment: (NOTE) SARS-CoV-2 target nucleic acids are NOT DETECTED. The SARS-CoV-2 RNA is generally detectable in upper and lower respiratory specimens during the acute phase of infection. The lowest concentration of SARS-CoV-2 viral copies this assay can detect is 250 copies / mL. A negative result does not preclude SARS-CoV-2 infection and should not be used as the sole basis for treatment or other patient management decisions.  A negative result may occur with improper specimen collection / handling, submission of specimen other than nasopharyngeal swab, presence of viral mutation(s) within the areas targeted by this assay, and inadequate number of viral copies (<250 copies / mL). A negative result must be combined  with clinical observations, patient history, and epidemiological information. Fact Sheet for Patients:   StrictlyIdeas.no Fact Sheet for Healthcare Providers: BankingDealers.co.za This test is not yet approved or cleared  by the [place] FDA and has been authorized for detection and/or diagnosis of SARS-CoV-2 by FDA under an Emergency Use Authorization (EUA).  This EUA will remain in effect (meaning this test can be used) for the duration of the COVID-19 declaration under Section 564(b)(1) of the Act, 21 U.S.C. section 360bbb-3(b)(1), unless the authorization is terminated or revoked sooner. Performed at [facility], [address] [place], [place] [address]   Aerobic/Anaerobic Culture (surgical/deep wound)     Status: None (Preliminary result)   Collection Time: 05/03/20 12:51 PM   Specimen: Gallbladder; Bile  Result Value Ref Range Status   Specimen Description   Final    GALL BLADDER Performed at [facility], [address] [place], [place] [address]    Special Requests   Final    Normal Performed at [facility], [address] [place], [place] [address]    Gram Stain   Final    RARE WBC PRESENT,BOTH PMN AND MONONUCLEAR FEW GRAM POSITIVE COCCI IN CHAINS Performed at [facility], [place] [address] [place], [place] [address]    Culture ABUNDANT ENTEROCOCCUS FAECIUM  Final   Report Status PENDING  Incomplete     Radiology Studies: CT IMAGE GUIDED DRAINAGE BY PERCUTANEOUS CATHETER  Result Date: 05/03/2020 INDICATION: Acute cholecystitis and need for percutaneous cholecystostomy as the patient is currently not a candidate for cholecystectomy. EXAM: CT GUIDED CHOLECYSTOSTOMY TUBE PLACEMENT MEDICATIONS: No additional medications. ANESTHESIA/SEDATION: Moderate (conscious) sedation was employed during this procedure. A total of Versed 0.5 mg and Fentanyl 25 mcg was administered intravenously. Moderate Sedation Time: 12 minutes. The patient's level of consciousness and vital signs were monitored continuously by radiology nursing throughout the procedure under my direct supervision. FLUOROSCOPY TIME:  The procedure was performed under CT guidance. COMPLICATIONS: None immediate. PROCEDURE: Informed written consent was obtained from the patient after a thorough discussion of the procedural risks, benefits and alternatives. All questions were addressed. Maximal Sterile Barrier Technique was utilized including caps, mask, sterile gowns, sterile gloves, sterile drape, hand hygiene and skin antiseptic. A timeout was performed prior to the initiation of the procedure. CT was performed through the upper to mid abdomen in a supine position. Under CT guidance, an 18 gauge trocar needle was advanced into the gallbladder lumen. Aspiration of bile was performed. A guidewire was advanced through the needle and the needle removed. The tract was dilated over the guidewire and a 10 French  percutaneous drainage catheter advanced into the gallbladder lumen. Catheter position was confirmed by CT. Additional bile sample was aspirated and sent for culture analysis. The catheter was connected to a gravity drainage bag and secured at the skin with a Prolene retention suture and StatLock device. FINDINGS: The gallbladder shows significant distension and there is increased prominence of inflammation around the gallbladder and gallbladder wall edema compared to the CT study yesterday. The cholecystostomy tube is draining dark bile after placement. IMPRESSION: CT-guided placement of percutaneous cholecystostomy tube. A 10 French drain was placed into the gallbladder lumen and attached to gravity bag drainage. A sample of bile aspirated directly from the gallbladder was sent for culture analysis. Electronically Signed   By: Aletta Edouard M.D.   On: 05/03/2020 13:37   US Abdomen Limited RUQ  Result Date: 05/03/2020 CLINICAL DATA:  Hydrops EXAM: ULTRASOUND  ABDOMEN LIMITED RIGHT UPPER QUADRANT COMPARISON:  Abdominal CT yesterday. FINDINGS: Gallbladder: Prominently distended spanning greater than 12 cm in length and 7 cm in transverse dimension. Mild wall thickening at 3.3 mm. No visualized gallstones or intraluminal sludge. No sonographic Murphy sign noted by sonographer. Common bile duct: Diameter: 5 mm, normal. Liver: No focal lesion identified. Heterogeneous in parenchymal echogenicity. Portal vein is patent on color Doppler imaging with normal direction of blood flow towards the liver. Other: Small amount of perihepatic/pericholecystic ascites/free fluid. IMPRESSION: 1. Distended gallbladder with mild wall thickening. No visualized gallstones. 2. No biliary dilatation. 3. Small amount of right upper quadrant free fluid/ascites. Electronically Signed   By: Keith Rake M.D.   On: 05/03/2020 11:48    Scheduled Meds: . cholecalciferol  2,000 Units Oral Daily  . colchicine  0.6 mg Oral Daily  .  finasteride  5 mg Oral Daily  . metoprolol succinate  25 mg Oral Daily  . pantoprazole  40 mg Oral Daily  . sodium chloride flush  5 mL Intracatheter Q8H   Continuous Infusions: . ceFEPime (MAXIPIME) IV Stopped (05/03/20 2132)  . lactated ringers 75 mL/hr at 05/04/20 1000  . metronidazole Stopped (05/04/20 1107)     LOS: 2 days   Time spent: 40 minutes.  Lorella Nimrod, MD [facility]  If 7PM-7AM, please contact night-coverage [email or web address]  05/04/2020, 4:18 PM   This record has been created using Systems analyst. Errors have been sought and corrected,but may not always be located. Such creation errors do not reflect on the standard of care.

## 2020-05-04 NOTE — Consult Note (Signed)
Pharmacy Antibiotic Note  Jose Castro is a 86 y.o. male admitted on 05/02/2020 with cholecystitis.  Pharmacy has been consulted for cefepime dosing. The patient confirms to me this morning a history of rash to penicillin but is tolerating the cefepime well. His renal function is worse than his baseline level currently  Plan:    Day 3 antibiotic. Continue Cefepime 2 grams IV every 24 hours  Height: 5\' 11"  (180.3 cm) Weight: 78.4 kg (172 lb 13.5 oz) IBW/kg (Calculated) : 75.3  Temp (24hrs), Avg:97.9 F (36.6 C), Min:97.5 F (36.4 C), Max:98.2 F (36.8 C)  Recent Labs  Lab 05/01/20 1701 05/02/20 1225 05/02/20 2050 05/03/20 0555 05/04/20 0418  WBC 11.4* 24.2*  --  23.0* 13.2*  CREATININE 1.11  --  1.59* 1.82* 2.17*    Estimated Creatinine Clearance: 23.6 mL/min (A) (by C-G formula based on SCr of 2.17 mg/dL (H)).    Allergies  Allergen Reactions  . Penicillins Itching and Swelling    Has patient had a PCN reaction causing immediate rash, facial/tongue/throat swelling, SOB or lightheadedness with hypotension: No Has patient had a PCN reaction causing severe rash involving mucus membranes or skin necrosis: No Has patient had a PCN reaction that required hospitalization: No Has patient had a PCN reaction occurring within the last 10 years: No If all of the above answers are "NO", then may proceed with Cephalosporin use.     Antimicrobials this admission: ciprofloxacin 6/2 x 1  cefepime 6/1 >>  Metronidazole 6/2 >>  Microbiology results: Rush Landmark from gallbladder collected 6/3: results pending  Thank you for allowing pharmacy to be a part of this patient's care.  Sharay Bellissimo A Denali Sharma 05/04/2020 9:35 AM

## 2020-05-04 NOTE — Progress Notes (Signed)
Patient got up to ambulate this afternoon and did well with it.  When I went to flush the biliary drain I noticed that it had broken at the stop cock.  I spoke with the RN in [place] and she came to look at it and then they decided it needed to be exchanged.  Patient went down and had it exchanged with no problems.  Continue to monitor.

## 2020-05-05 ENCOUNTER — Inpatient Hospital Stay: Payer: Medicare Other

## 2020-05-05 LAB — HEPATIC FUNCTION PANEL
ALT: 238 U/L — ABNORMAL HIGH (ref 0–44)
AST: 126 U/L — ABNORMAL HIGH (ref 15–41)
Albumin: 2.4 g/dL — ABNORMAL LOW (ref 3.5–5.0)
Alkaline Phosphatase: 186 U/L — ABNORMAL HIGH (ref 38–126)
Bilirubin, Direct: 1.2 mg/dL — ABNORMAL HIGH (ref 0.0–0.2)
Indirect Bilirubin: 1.1 mg/dL — ABNORMAL HIGH (ref 0.3–0.9)
Total Bilirubin: 2.3 mg/dL — ABNORMAL HIGH (ref 0.3–1.2)
Total Protein: 5 g/dL — ABNORMAL LOW (ref 6.5–8.1)

## 2020-05-05 LAB — CBC
HCT: 38.6 % — ABNORMAL LOW (ref 39.0–52.0)
Hemoglobin: 13.4 g/dL (ref 13.0–17.0)
MCH: 30.5 pg (ref 26.0–34.0)
MCHC: 34.7 g/dL (ref 30.0–36.0)
MCV: 87.9 fL (ref 80.0–100.0)
Platelets: 183 10*3/uL (ref 150–400)
RBC: 4.39 MIL/uL (ref 4.22–5.81)
RDW: 14.4 % (ref 11.5–15.5)
WBC: 10.3 10*3/uL (ref 4.0–10.5)
nRBC: 0 % (ref 0.0–0.2)

## 2020-05-05 LAB — BASIC METABOLIC PANEL
Anion gap: 5 (ref 5–15)
BUN: 35 mg/dL — ABNORMAL HIGH (ref 8–23)
CO2: 23 mmol/L (ref 22–32)
Calcium: 8 mg/dL — ABNORMAL LOW (ref 8.9–10.3)
Chloride: 109 mmol/L (ref 98–111)
Creatinine, Ser: 2.19 mg/dL — ABNORMAL HIGH (ref 0.61–1.24)
GFR calc Af Amer: 29 mL/min — ABNORMAL LOW (ref >60)
GFR calc non Af Amer: 25 mL/min — ABNORMAL LOW (ref >60)
Glucose, Bld: 112 mg/dL — ABNORMAL HIGH (ref 70–99)
Potassium: 4 mmol/L (ref 3.5–5.1)
Sodium: 137 mmol/L (ref 135–145)

## 2020-05-05 LAB — PTT FACTOR INHIBITOR (MIXING STUDY): aPTT: 31 s — ABNORMAL HIGH (ref 22.9–30.2)

## 2020-05-05 MED ORDER — PROMETHAZINE HCL 25 MG/ML IJ SOLN
12.5000 mg | Freq: Four times a day (QID) | INTRAMUSCULAR | Status: DC | PRN
  Administered 2020-05-05 – 2020-05-06 (×2): 12.5 mg via INTRAVENOUS
  Filled 2020-05-05 (×2): qty 1

## 2020-05-05 MED ORDER — VANCOMYCIN HCL 1500 MG/300ML IV SOLN
1500.0000 mg | Freq: Once | INTRAVENOUS | Status: AC
  Administered 2020-05-05: 1500 mg via INTRAVENOUS
  Filled 2020-05-05: qty 300

## 2020-05-05 MED ORDER — APIXABAN 2.5 MG PO TABS
2.5000 mg | ORAL_TABLET | Freq: Two times a day (BID) | ORAL | Status: DC
  Administered 2020-05-05: 2.5 mg via ORAL
  Filled 2020-05-05: qty 1

## 2020-05-05 MED ORDER — VANCOMYCIN HCL 750 MG/150ML IV SOLN
750.0000 mg | INTRAVENOUS | Status: DC
  Administered 2020-05-06 – 2020-05-08 (×3): 750 mg via INTRAVENOUS
  Filled 2020-05-05 (×4): qty 150

## 2020-05-05 NOTE — Progress Notes (Signed)
AVSS.  O2 sats drop during sleep, otherwise, OK. 90% on RA. Tolerated diet advance well.  Tube fracture repaired by IR. Draining about 300cc/ 24 golden bile. Lungs: Clear. Cardio: RR. ABD: Soft.   Labs: WBC down to 10,300 with less left shift.  Renal function remains lower than admission. Repeat LFT's pending.  IMP: Doing well. Has reviewed the case with Dr. Allen Norris.  If an intervention is required, he may have cholecystectomy first, ERCP if intra-operative cholangiograms show a persistent defect. In light of the modest biliary volumes presently observed, it is likely the stone in the CBD has passed.  No urgent plans for surgical/ endoscopic intervention.   Would consider restart on Eliquis at this time.  Consider change to po antibiotics.  I have asked his daugher, Jose Castro to call my office on Monday to arrange an outpatient follow up visit.   The biliary drain should remain in place until that time.

## 2020-05-05 NOTE — Consult Note (Signed)
Pharmacy Antibiotic Note  Jose Castro is a 86 y.o. male admitted on 05/02/2020 with cholecystitis. Cx taking of Bile from Gallbladder grew abundant Enterococcus faecium and faecalis.   Pharmacy has been consulted for vancomycin  dosing. The patient confirms rash w/ penicillins.   Plan:     Vancomycin 1500mg  IV x 1 dose, followed by vancomycin 750mg  IV every 24 hours.   Continue Flagyl 500mg  IV every 8 hours   Height: 5\' 11"  (180.3 cm) Weight: 78.4 kg (172 lb 13.5 oz) IBW/kg (Calculated) : 75.3  Temp (24hrs), Avg:97.7 F (36.5 C), Min:97.4 F (36.3 C), Max:98.3 F (36.8 C)  Recent Labs  Lab 05/01/20 1701 05/02/20 1225 05/02/20 2050 05/03/20 0555 05/04/20 0418 05/05/20 0528  WBC 11.4* 24.2*  --  23.0* 13.2* 10.3  CREATININE 1.11  --  1.59* 1.82* 2.17* 2.19*    Estimated Creatinine Clearance: 23.4 mL/min (A) (by C-G formula based on SCr of 2.19 mg/dL (H)).    Allergies  Allergen Reactions  . Penicillins Itching and Swelling    Has patient had a PCN reaction causing immediate rash, facial/tongue/throat swelling, SOB or lightheadedness with hypotension: No Has patient had a PCN reaction causing severe rash involving mucus membranes or skin necrosis: No Has patient had a PCN reaction that required hospitalization: No Has patient had a PCN reaction occurring within the last 10 years: No If all of the above answers are "NO", then may proceed with Cephalosporin use.     Antimicrobials this admission: ciprofloxacin 6/2 x 1  cefepime 6/1 >> 6/5 Metronidazole 6/2 >> Vancomycin 6/5>>  Microbiology results: Rush Landmark from gallbladder collected 6/3: Enterococcus faecium and faecalis    Thank you for allowing pharmacy to be a part of this patient's care.  Pernell Dupre, PharmD, BCPS Clinical Pharmacist 05/05/2020 4:12 PM

## 2020-05-05 NOTE — Progress Notes (Signed)
Attempted IS education, Pt sleeping. IS left in room, RN aware of Education need.

## 2020-05-05 NOTE — Progress Notes (Signed)
PROGRESS NOTE    Jose Castro  PJK:[id number] DOB: 1926/10/16 DOA: 05/02/2020 PCP: Steele Sizer, MD   Brief Narrative:  Jose Castro  is a 86 y.o. male with a known history of hypertension, dyslipidemia, BPH, CVA, GERD and osteoarthritis who was directly admitted for epigastric pain and nausea with dry heaves since last night and that was preceded by an ER visit for substernal chest pain yesterday when he was ruled out ACS and discharged.  Today he was seen by his primary care physician who discussed the case with Dr. Tollie Pizza for direct admission. CT abdomen with gallbladder distention and thickening with probable small layering of stones.  Findings were concerning for cholecystitis.  CMP with transaminitis and elevated T bili,  concerning for obstructive jaundice. GI was also consulted for ERCP but patient has to be off from Eliquis for more than 48 hours for procedure.  Cholecystostomy tube was placed by IR today.  Subjective: Patient was feeling better when seen today.  Daughter was in the room.  Assessment & Plan:   Active Problems:   Acute cholecystitis  Acute cholecystitis.  Patient afebrile with  improvement in leukocytosis today.  Patient had cystostomy tube in place by IR it was changed yesterday due to some leakage.  Improvement in his labs.  Tube draining yellow-colored fluid. Surgery is following-appreciate their recommendations, no need for emergent surgery at this time. -Continue cefepime. -Continue Flagyl.  AKI.  Patient appears dry.  Creatinine remain around 2.1 despite giving IV fluid.  Baseline around 1.3.  Bladder scan with about 200 cc but that was not post voidal.  Renal ultrasound without any hydronephrosis.  Did receive some contrast for CT abdomen on the day of admission. -IV hydration. -Continue to monitor for 1 more day. -Avoid nephrotoxins  Chronic atrial fibrillation, status post pacemaker placement. -We will continue Toprol-XL. -Restart Eliquis today     Dyslipidemia with significantly elevated LFTs likely related to acute hepatitis. -Holding Crestor for now as well as fish oil, given elevated LFTs.   BPH. -Flomax will be resumed.    History of gout. Placed on colchicine as prophylaxis. -Discontinue colchicine.   Objective: Vitals:   05/05/20 0732 05/05/20 1030 05/05/20 1034 05/05/20 1340  BP:  (!) 158/75  (!) 158/81  Pulse:  (!) 59 62 61  Resp:    18  Temp:    (!) 97.4 F (36.3 C)  TempSrc:    Oral  SpO2: 93%   92%  Weight:      Height:        Intake/Output Summary (Last 24 hours) at 05/05/2020 1525 Last data filed at 05/05/2020 1100 Gross per 24 hour  Intake 310 ml  Output 2305 ml  Net -1995 ml   Filed Weights   05/03/20 0218  Weight: 78.4 kg    Examination:  General exam: Appears calm and comfortable  Respiratory system: Clear to auscultation. Respiratory effort normal. Cardiovascular system: S1 & S2 heard, RRR. No JVD, murmurs, rubs,  Gastrointestinal system: Soft, mild right upper quadrant tenderness, nondistended, bowel sounds positive.RUQ drainage tube in place. Central nervous system: Alert and oriented. No focal neurological deficits. Extremities: No edema, no cyanosis, pulses intact and symmetrical. Skin: No rashes, lesions or ulcers Psychiatry: Judgement and insight appear normal.   DVT prophylaxis: SCDs Code Status: Full Family Communication: Daughter was updated at bedside. Disposition Plan:  Status is: Inpatient  Remains inpatient appropriate because:IV treatments appropriate due to intensity of illness or inability to take PO   Dispo:  The patient is from: Home              Anticipated d/c is to: Home              Anticipated d/c date is: 1 day.              Patient currently is not medically stable to d/c.  We will monitor renal function for 1 more day on IV hydration.   Consultants:   Surgery  IR  GI  Procedures:  IR guided Cholicystostomy tube placement.  Antimicrobials:   Cefepime Flagyl  Data Reviewed: I have personally reviewed following labs and imaging studies  CBC: Recent Labs  Lab 05/01/20 1701 05/02/20 1225 05/03/20 0555 05/04/20 0418 05/05/20 0528  WBC 11.4* 24.2* 23.0* 13.2* 10.3  NEUTROABS  --  21,659*  --  11.4*  --   HGB 15.8 14.8 13.8 13.2 13.4  HCT 46.7 44.2 39.1 39.2 38.6*  MCV 92.3 93.4 89.5 91.0 87.9  PLT 287 247 195 167 330   Basic Metabolic Panel: Recent Labs  Lab 05/01/20 1701 05/02/20 2050 05/03/20 0555 05/04/20 0418 05/05/20 0528  NA 143 134* 131* 134* 137  K 3.5 4.1 4.1 4.0 4.0  CL 108 100 101 104 109  CO2 26 24 24 22 23   GLUCOSE 92 104* 125* 134* 112*  BUN 18 30* 32* 37* 35*  CREATININE 1.11 1.59* 1.82* 2.17* 2.19*  CALCIUM 9.0 8.5* 8.0* 7.8* 8.0*  MG  --   --  1.5*  --   --    GFR: Estimated Creatinine Clearance: 23.4 mL/min (A) (by C-G formula based on SCr of 2.19 mg/dL (H)). Liver Function Tests: Recent Labs  Lab 05/02/20 2050 05/03/20 0555 05/04/20 0418 05/05/20 0528  AST 659* 420* 177* 126*  ALT 690* 536* 321* 238*  ALKPHOS 162* 142* 164* 186*  BILITOT 6.0* 5.5* 4.7* 2.3*  PROT 5.8* 5.4* 5.1* 5.0*  ALBUMIN 3.2* 2.8* 2.5* 2.4*   Recent Labs  Lab 05/01/20 1701  LIPASE 46   No results for input(s): AMMONIA in the last 168 hours. Coagulation Profile: Recent Labs  Lab 05/02/20 1953  INR 2.0*   Cardiac Enzymes: No results for input(s): CKTOTAL, CKMB, CKMBINDEX, TROPONINI in the last 168 hours. BNP (last 3 results) No results for input(s): PROBNP in the last 8760 hours. HbA1C: No results for input(s): HGBA1C in the last 72 hours. CBG: No results for input(s): GLUCAP in the last 168 hours. Lipid Profile: No results for input(s): CHOL, HDL, LDLCALC, TRIG, CHOLHDL, LDLDIRECT in the last 72 hours. Thyroid Function Tests: No results for input(s): TSH, T4TOTAL, FREET4, T3FREE, THYROIDAB in the last 72 hours. Anemia Panel: No results for input(s): VITAMINB12, FOLATE, FERRITIN, TIBC, IRON,  RETICCTPCT in the last 72 hours. Sepsis Labs: No results for input(s): PROCALCITON, LATICACIDVEN in the last 168 hours.  Recent Results (from the past 240 hour(s))  SARS Coronavirus 2 by RT PCR (hospital order, performed in [facility] hospital lab) Nasopharyngeal Nasopharyngeal Swab     Status: None   Collection Time: 05/03/20 10:25 AM   Specimen: Nasopharyngeal Swab  Result Value Ref Range Status   SARS Coronavirus 2 NEGATIVE NEGATIVE Final    Comment: (NOTE) SARS-CoV-2 target nucleic acids are NOT DETECTED. The SARS-CoV-2 RNA is generally detectable in upper and lower respiratory specimens during the acute phase of infection. The lowest concentration of SARS-CoV-2 viral copies this assay can detect is 250 copies / mL. A negative result does not preclude SARS-CoV-2 infection  and should not be used as the sole basis for treatment or other patient management decisions.  A negative result may occur with improper specimen collection / handling, submission of specimen other than nasopharyngeal swab, presence of viral mutation(s) within the areas targeted by this assay, and inadequate number of viral copies (<250 copies / mL). A negative result must be combined with clinical observations, patient history, and epidemiological information. Fact Sheet for Patients:   StrictlyIdeas.no Fact Sheet for Healthcare Providers: BankingDealers.co.za This test is not yet approved or cleared  by the [place] FDA and has been authorized for detection and/or diagnosis of SARS-CoV-2 by FDA under an Emergency Use Authorization (EUA).  This EUA will remain in effect (meaning this test can be used) for the duration of the COVID-19 declaration under Section 564(b)(1) of the Act, 21 U.S.C. section 360bbb-3(b)(1), unless the authorization is terminated or revoked sooner. Performed at [facility], [address] [place], [place] [address]    Aerobic/Anaerobic Culture (surgical/deep wound)     Status: None (Preliminary result)   Collection Time: 05/03/20 12:51 PM   Specimen: Gallbladder; Bile  Result Value Ref Range Status   Specimen Description   Final    GALL BLADDER Performed at [facility], [address], [place], [place] [address]    Special Requests   Final    Normal Performed at [facility], [address] [place], [place] [address]    Gram Stain   Final    RARE WBC PRESENT,BOTH PMN AND MONONUCLEAR FEW GRAM POSITIVE COCCI IN CHAINS Performed at [facility], [address], [place], [place] [address]    Culture   Final    ABUNDANT ENTEROCOCCUS FAECIUM ABUNDANT ENTEROCOCCUS FAECALIS    Report Status PENDING  Incomplete   Organism ID, Bacteria ENTEROCOCCUS FAECIUM  Final      Susceptibility   Enterococcus faecium - MIC*    AMPICILLIN <=2 SENSITIVE Sensitive     VANCOMYCIN 1 SENSITIVE Sensitive     GENTAMICIN SYNERGY SENSITIVE Sensitive     * ABUNDANT ENTEROCOCCUS FAECIUM     Radiology Studies: US RENAL  Result Date: 05/05/2020 CLINICAL DATA:  Acute renal failure EXAM: RENAL / URINARY TRACT ULTRASOUND COMPLETE COMPARISON:  CT abdomen pelvis 05/02/2020 FINDINGS: Right Kidney: Renal measurements: 12.4 x 7.5 x 6.1 cm = volume: 299 mL. Normal renal cortical thickness. Increased renal cortical echogenicity. No hydronephrosis. Multiple cysts within the right kidney measuring up to 4 cm. Left Kidney: Renal measurements: 11.9 x 6.8 x 5.1 cm = volume: 219 mL. Normal renal cortical thickness. Increased renal cortical echogenicity. No hydronephrosis. Cyst within the left kidney measuring up to 1.5 cm. Bladder: Appears normal for degree of bladder distention. Other: None. IMPRESSION: No hydronephrosis. Increased renal cortical echogenicity as can be seen with chronic medical renal disease. Electronically Signed   By: Lovey Newcomer M.D.   On: 05/05/2020 14:30   IR EXCHANGE BILIARY DRAIN  Result  Date: 05/04/2020 INDICATION: Status post placement of percutaneous cholecystostomy tube yesterday to treat acute cholecystitis. The tube has become disrupted with separation of the drainage catheter from its external hub outside of the patient's body. The catheter requires exchange. EXAM: PERCUTANEOUS CHOLECYSTOSTOMY TUBE EXCHANGE MEDICATIONS: None ANESTHESIA/SEDATION: None FLUOROSCOPY TIME:  Fluoroscopy Time: 42 seconds.  7.3 mGy. CONTRAST:  7 mL Visipaque [id number] COMPLICATIONS: None immediate. PROCEDURE: Informed written consent was obtained from the patient after a thorough discussion of the procedural risks, benefits and alternatives. All questions were addressed. Maximal  Sterile Barrier Technique was utilized including caps, mask, sterile gowns, sterile gloves, sterile drape, hand hygiene and skin antiseptic. A timeout was performed prior to the initiation of the procedure. The pre-existing cholecystostomy tube was cut, injected with contrast and removed over a guidewire. A new 10 French drainage catheter was then advanced over the wire and formed. Positioning of the catheter was confirmed by fluoroscopy after contrast injection. The catheter was then connected to a new gravity drainage bag and secured at the skin with a Prolene retention suture. FINDINGS: The new cholecystostomy tube was formed in the gallbladder lumen with return of bile. IMPRESSION: Exchange of 10 French cholecystostomy tube due to disruption of the previously placed catheter. A new 10 French catheter was placed and positioned in the gallbladder lumen. This was attached to gravity bag drainage. Electronically Signed   By: Aletta Edouard M.D.   On: 05/04/2020 16:31    Scheduled Meds: . apixaban  2.5 mg Oral BID  . cholecalciferol  2,000 Units Oral Daily  . finasteride  5 mg Oral Daily  . metoprolol succinate  25 mg Oral Daily  . pantoprazole  40 mg Oral Daily  . sodium chloride flush  5 mL Intracatheter Q8H   Continuous Infusions: .  ceFEPime (MAXIPIME) IV Stopped (05/04/20 2124)  . metronidazole Stopped (05/05/20 1200)     LOS: 3 days   Time spent: 40 minutes.  Lorella Nimrod, MD [facility]  If 7PM-7AM, please contact night-coverage [email or web address]  05/05/2020, 3:25 PM   This record has been created using Systems analyst. Errors have been sought and corrected,but may not always be located. Such creation errors do not reflect on the standard of care.

## 2020-05-06 ENCOUNTER — Inpatient Hospital Stay: Payer: Medicare Other

## 2020-05-06 ENCOUNTER — Encounter: Payer: Self-pay | Admitting: Family Medicine

## 2020-05-06 LAB — CBC WITH DIFFERENTIAL/PLATELET
Abs Immature Granulocytes: 0.07 10*3/uL (ref 0.00–0.07)
Basophils Absolute: 0.1 10*3/uL (ref 0.0–0.1)
Basophils Relative: 1 %
Eosinophils Absolute: 0.1 10*3/uL (ref 0.0–0.5)
Eosinophils Relative: 1 %
HCT: 44.7 % (ref 39.0–52.0)
Hemoglobin: 15.8 g/dL (ref 13.0–17.0)
Immature Granulocytes: 1 %
Lymphocytes Relative: 10 %
Lymphs Abs: 1.3 10*3/uL (ref 0.7–4.0)
MCH: 30.6 pg (ref 26.0–34.0)
MCHC: 35.3 g/dL (ref 30.0–36.0)
MCV: 86.5 fL (ref 80.0–100.0)
Monocytes Absolute: 1.5 10*3/uL — ABNORMAL HIGH (ref 0.1–1.0)
Monocytes Relative: 11 %
Neutro Abs: 10.6 10*3/uL — ABNORMAL HIGH (ref 1.7–7.7)
Neutrophils Relative %: 76 %
Platelets: 226 10*3/uL (ref 150–400)
RBC: 5.17 MIL/uL (ref 4.22–5.81)
RDW: 14.5 % (ref 11.5–15.5)
WBC: 13.6 10*3/uL — ABNORMAL HIGH (ref 4.0–10.5)
nRBC: 0 % (ref 0.0–0.2)

## 2020-05-06 LAB — COMPREHENSIVE METABOLIC PANEL
ALT: 183 U/L — ABNORMAL HIGH (ref 0–44)
AST: 59 U/L — ABNORMAL HIGH (ref 15–41)
Albumin: 2.7 g/dL — ABNORMAL LOW (ref 3.5–5.0)
Alkaline Phosphatase: 184 U/L — ABNORMAL HIGH (ref 38–126)
Anion gap: 13 (ref 5–15)
BUN: 37 mg/dL — ABNORMAL HIGH (ref 8–23)
CO2: 22 mmol/L (ref 22–32)
Calcium: 8.6 mg/dL — ABNORMAL LOW (ref 8.9–10.3)
Chloride: 104 mmol/L (ref 98–111)
Creatinine, Ser: 2.19 mg/dL — ABNORMAL HIGH (ref 0.61–1.24)
GFR calc Af Amer: 29 mL/min — ABNORMAL LOW (ref >60)
GFR calc non Af Amer: 25 mL/min — ABNORMAL LOW (ref >60)
Glucose, Bld: 94 mg/dL (ref 70–99)
Potassium: 3 mmol/L — ABNORMAL LOW (ref 3.5–5.1)
Sodium: 139 mmol/L (ref 135–145)
Total Bilirubin: 2.6 mg/dL — ABNORMAL HIGH (ref 0.3–1.2)
Total Protein: 5.9 g/dL — ABNORMAL LOW (ref 6.5–8.1)

## 2020-05-06 LAB — MAGNESIUM: Magnesium: 1.9 mg/dL (ref 1.7–2.4)

## 2020-05-06 MED ORDER — LACTATED RINGERS IV SOLN: INTRAVENOUS | Status: DC

## 2020-05-06 MED ORDER — POTASSIUM CHLORIDE 10 MEQ/100ML IV SOLN
10.0000 meq | INTRAVENOUS | Status: AC
  Administered 2020-05-06 (×6): 10 meq via INTRAVENOUS
  Filled 2020-05-06 (×4): qty 100

## 2020-05-06 MED ORDER — BUTAMBEN-TETRACAINE-BENZOCAINE 2-2-14 % EX AERO
3.0000 | INHALATION_SPRAY | Freq: Once | CUTANEOUS | Status: AC
  Administered 2020-05-06: 3 via TOPICAL
  Filled 2020-05-06: qty 20

## 2020-05-06 MED ORDER — POTASSIUM CHLORIDE CRYS ER 20 MEQ PO TBCR: 40.0000 meq | EXTENDED_RELEASE_TABLET | Freq: Once | ORAL | Status: DC

## 2020-05-06 MED ORDER — PANTOPRAZOLE SODIUM 40 MG IV SOLR
40.0000 mg | INTRAVENOUS | Status: DC
  Administered 2020-05-06 – 2020-05-13 (×8): 40 mg via INTRAVENOUS
  Filled 2020-05-06 (×8): qty 40

## 2020-05-06 NOTE — Progress Notes (Signed)
NG shows marked gastric distension. Small bowel and colon decompressed.  WBC up, but HGB up from 13 to 15 suggesting dehydration.  Gastric ileus related to cholecystitis, no clinical evidence (on exam) for gallbladder necrosis.   Discussed NG placement w/ patient and daughters [name] and [name]). Patient amenable.

## 2020-05-06 NOTE — Plan of Care (Signed)
Patient still nauseated this morning but has settled.  Xray was done for possible ileus, results pending.  He did ambulate once already today.  Little appetite.  Continue to monitor.

## 2020-05-06 NOTE — Progress Notes (Signed)
AVSS. Multiple small emesis with episodes of coughing.  Denies pain. A little lethargic this AM. Reports small BM after coughing spell.  Lungs: clear Cardio: RR, ABD: Distended, soft, tympantic.  Biliary drain: patent, 500+ cc yesterday.  TB up from 2.3 to 2.6 today. Transaminases continue down. Slower decrease in alk phos.  IMP: Ileus, possible related to bile leak (intra-abdominal) vs reflex from illness.  Plan: Observation.  Hold Eliqus at present in the event operative intervention required.

## 2020-05-06 NOTE — Progress Notes (Signed)
PROGRESS NOTE    Jose Castro  IRC:[id number] DOB: 08/08/1926 DOA: 05/02/2020 PCP: Steele Sizer, MD   Brief Narrative:  [name]  is a 86 y.o. male with a known history of hypertension, dyslipidemia, BPH, CVA, GERD and osteoarthritis who was directly admitted for epigastric pain and nausea with dry heaves since last night and that was preceded by an ER visit for substernal chest pain yesterday when he was ruled out ACS and discharged.  Today he was seen by his primary care physician who discussed the case with Dr. Tollie Pizza for direct admission. CT abdomen with gallbladder distention and thickening with probable small layering of stones.  Findings were concerning for cholecystitis.  CMP with transaminitis and elevated T bili,  concerning for obstructive jaundice. GI was also consulted for ERCP but patient has to be off from Eliquis for more than 48 hours for procedure.  Cholecystostomy tube was placed by IR today.  Subjective: Developed some nausea and dry heaving since last night.  Does not want to eat due to this.  Daughter was in the room.  Assessment & Plan:   Active Problems:   Acute cholecystitis  Acute cholecystitis.  Patient afebrile with  improvement in leukocytosis.  Patient had cystostomy tube in place by IR it was changed 05/05/20 due to some leakage.  Improvement in his labs.  Tube draining yellow-colored fluid. Surgery is following-appreciate their recommendations, no need for emergent surgery at this time. Bile cultures with Enterococcus faecalis and Enterococcus faecium. -Cefepime was switched with vancomycin according to sensitivity report. -Continue Flagyl. -Patient can be discharged home on Augmentin for another 7 to 10 days.  Discussed case with Dr. Megan Salon from [place] [facility] on phone.  Nausea and vomiting.  There was some concern of ileus.  Abdominal x-ray with normal bowel and distended stomach. -Continue to monitor. -Zofran and Phenergan as needed.  AKI.  Patient appears  dry.  Creatinine remain around 2.1 despite giving IV fluid.  Baseline around 1.3.  Bladder scan with about 200 cc but that was not post voidal.  Renal ultrasound without any hydronephrosis.  Did receive some contrast for CT abdomen on the day of admission. -IV hydration. -Continue to monitor for 1 more day. -Avoid nephrotoxins  Chronic atrial fibrillation, status post pacemaker placement. -We will continue Toprol-XL. -hold Eliquis today at surgery request as he might need some intervention sooner.    Dyslipidemia with significantly elevated LFTs likely related to acute hepatitis. -Holding Crestor for now as well as fish oil, given elevated LFTs.   BPH. -Flomax will be resumed.    History of gout. Placed on colchicine as prophylaxis. -Discontinue colchicine.   Objective: Vitals:   05/05/20 2009 05/06/20 0549 05/06/20 1144 05/06/20 1252  BP: 124/71 (!) 164/94 130/81 119/78  Pulse: (!) 59 (!) 59 (!) 59 60  Resp: 20 20 16 16   Temp: 97.8 F (36.6 C) (!) 97.5 F (36.4 C) 98.1 F (36.7 C) 98.2 F (36.8 C)  TempSrc: Oral Oral Oral Axillary  SpO2: 91% 92% 93% 95%  Weight:      Height:        Intake/Output Summary (Last 24 hours) at 05/06/2020 1501 Last data filed at 05/06/2020 1448 Gross per 24 hour  Intake 662.08 ml  Output 1420 ml  Net -757.92 ml   Filed Weights   05/03/20 0218  Weight: 78.4 kg    Examination:  General exam: Appears calm and comfortable  Respiratory system: Clear to auscultation. Respiratory effort normal. Cardiovascular system: S1 &  S2 heard, RRR. No JVD, murmurs, rubs,  Gastrointestinal system: Soft, nontender, nondistended, bowel sounds positive.RUQ drainage tube in place. Central nervous system: Alert and oriented. No focal neurological deficits. Extremities: No edema, no cyanosis, pulses intact and symmetrical. Skin: No rashes, lesions or ulcers Psychiatry: Judgement and insight appear normal.   DVT prophylaxis: SCDs Code Status:  Full Family Communication: Daughter was updated at bedside. Disposition Plan:  Status is: Inpatient  Remains inpatient appropriate because:IV treatments appropriate due to intensity of illness or inability to take PO   Dispo: The patient is from: Home              Anticipated d/c is to: Home              Anticipated d/c date is: 1 day.              Patient currently is not medically stable to d/c.  Patient developed nausea and some vomiting.  Concern of ileus.  We will observe for another day.  Might need surgical intervention.  Consultants:   Surgery  IR  GI  Procedures:  IR guided Cholicystostomy tube placement.  Antimicrobials:  Flagyl Vancomycin  Data Reviewed: I have personally reviewed following labs and imaging studies  CBC: Recent Labs  Lab 05/02/20 1225 05/03/20 0555 05/04/20 0418 05/05/20 0528 05/06/20 0710  WBC 24.2* 23.0* 13.2* 10.3 13.6*  NEUTROABS 21,659*  --  11.4*  --  10.6*  HGB 14.8 13.8 13.2 13.4 15.8  HCT 44.2 39.1 39.2 38.6* 44.7  MCV 93.4 89.5 91.0 87.9 86.5  PLT 247 195 167 183 185   Basic Metabolic Panel: Recent Labs  Lab 05/02/20 2050 05/03/20 0555 05/04/20 0418 05/05/20 0528 05/06/20 0710  NA 134* 131* 134* 137 139  K 4.1 4.1 4.0 4.0 3.0*  CL 100 101 104 109 104  CO2 24 24 22 23 22   GLUCOSE 104* 125* 134* 112* 94  BUN 30* 32* 37* 35* 37*  CREATININE 1.59* 1.82* 2.17* 2.19* 2.19*  CALCIUM 8.5* 8.0* 7.8* 8.0* 8.6*  MG  --  1.5*  --   --  1.9   GFR: Estimated Creatinine Clearance: 23.4 mL/min (A) (by C-G formula based on SCr of 2.19 mg/dL (H)). Liver Function Tests: Recent Labs  Lab 05/02/20 2050 05/03/20 0555 05/04/20 0418 05/05/20 0528 05/06/20 0710  AST 659* 420* 177* 126* 59*  ALT 690* 536* 321* 238* 183*  ALKPHOS 162* 142* 164* 186* 184*  BILITOT 6.0* 5.5* 4.7* 2.3* 2.6*  PROT 5.8* 5.4* 5.1* 5.0* 5.9*  ALBUMIN 3.2* 2.8* 2.5* 2.4* 2.7*   Recent Labs  Lab 05/01/20 1701  LIPASE 46   No results for input(s):  AMMONIA in the last 168 hours. Coagulation Profile: Recent Labs  Lab 05/02/20 1953  INR 2.0*   Cardiac Enzymes: No results for input(s): CKTOTAL, CKMB, CKMBINDEX, TROPONINI in the last 168 hours. BNP (last 3 results) No results for input(s): PROBNP in the last 8760 hours. HbA1C: No results for input(s): HGBA1C in the last 72 hours. CBG: No results for input(s): GLUCAP in the last 168 hours. Lipid Profile: No results for input(s): CHOL, HDL, LDLCALC, TRIG, CHOLHDL, LDLDIRECT in the last 72 hours. Thyroid Function Tests: No results for input(s): TSH, T4TOTAL, FREET4, T3FREE, THYROIDAB in the last 72 hours. Anemia Panel: No results for input(s): VITAMINB12, FOLATE, FERRITIN, TIBC, IRON, RETICCTPCT in the last 72 hours. Sepsis Labs: No results for input(s): PROCALCITON, LATICACIDVEN in the last 168 hours.  Recent Results (from the past 240  hour(s))  SARS Coronavirus 2 by RT PCR (hospital order, performed in [facility] hospital lab) Nasopharyngeal Nasopharyngeal Swab     Status: None   Collection Time: 05/03/20 10:25 AM   Specimen: Nasopharyngeal Swab  Result Value Ref Range Status   SARS Coronavirus 2 NEGATIVE NEGATIVE Final    Comment: (NOTE) SARS-CoV-2 target nucleic acids are NOT DETECTED. The SARS-CoV-2 RNA is generally detectable in upper and lower respiratory specimens during the acute phase of infection. The lowest concentration of SARS-CoV-2 viral copies this assay can detect is 250 copies / mL. A negative result does not preclude SARS-CoV-2 infection and should not be used as the sole basis for treatment or other patient management decisions.  A negative result may occur with improper specimen collection / handling, submission of specimen other than nasopharyngeal swab, presence of viral mutation(s) within the areas targeted by this assay, and inadequate number of viral copies (<250 copies / mL). A negative result must be combined with clinical observations, patient  history, and epidemiological information. Fact Sheet for Patients:   StrictlyIdeas.no Fact Sheet for Healthcare Providers: BankingDealers.co.za This test is not yet approved or cleared  by the [place] FDA and has been authorized for detection and/or diagnosis of SARS-CoV-2 by FDA under an Emergency Use Authorization (EUA).  This EUA will remain in effect (meaning this test can be used) for the duration of the COVID-19 declaration under Section 564(b)(1) of the Act, 21 U.S.C. section 360bbb-3(b)(1), unless the authorization is terminated or revoked sooner. Performed at [facility], [address] [place], [place] [address]   Aerobic/Anaerobic Culture (surgical/deep wound)     Status: None (Preliminary result)   Collection Time: 05/03/20 12:51 PM   Specimen: Gallbladder; Bile  Result Value Ref Range Status   Specimen Description   Final    GALL BLADDER Performed at [facility], [address], [place], [place] [address]    Special Requests   Final    Normal Performed at [facility], [address] [place], [place] [address]    Gram Stain   Final    RARE WBC PRESENT,BOTH PMN AND MONONUCLEAR FEW GRAM POSITIVE COCCI IN CHAINS Performed at [facility], [place] [address] [place], [place] [address]    Culture   Final    ABUNDANT ENTEROCOCCUS FAECIUM ABUNDANT ENTEROCOCCUS FAECALIS NO ANAEROBES ISOLATED; CULTURE IN PROGRESS FOR 5 DAYS    Report Status PENDING  Incomplete   Organism ID, Bacteria ENTEROCOCCUS FAECIUM  Final   Organism ID, Bacteria ENTEROCOCCUS FAECALIS  Final      Susceptibility   Enterococcus faecalis - MIC*    AMPICILLIN <=2 SENSITIVE Sensitive     VANCOMYCIN 2 SENSITIVE Sensitive     GENTAMICIN SYNERGY SENSITIVE Sensitive     * ABUNDANT ENTEROCOCCUS FAECALIS   Enterococcus faecium - MIC*    AMPICILLIN <=2 SENSITIVE Sensitive     VANCOMYCIN 1 SENSITIVE Sensitive      GENTAMICIN SYNERGY SENSITIVE Sensitive     * ABUNDANT ENTEROCOCCUS FAECIUM     Radiology Studies: US RENAL  Result Date: 05/05/2020 CLINICAL DATA:  Acute renal failure EXAM: RENAL / URINARY TRACT ULTRASOUND COMPLETE COMPARISON:  CT abdomen pelvis 05/02/2020 FINDINGS: Right Kidney: Renal measurements: 12.4 x 7.5 x 6.1 cm = volume: 299 mL. Normal renal cortical thickness. Increased renal cortical echogenicity. No hydronephrosis. Multiple cysts within the right kidney measuring up to 4 cm. Left Kidney: Renal measurements: 11.9 x 6.8 x 5.1 cm = volume: 219 mL.  Normal renal cortical thickness. Increased renal cortical echogenicity. No hydronephrosis. Cyst within the left kidney measuring up to 1.5 cm. Bladder: Appears normal for degree of bladder distention. Other: None. IMPRESSION: No hydronephrosis. Increased renal cortical echogenicity as can be seen with chronic medical renal disease. Electronically Signed   By: Lovey Newcomer M.D.   On: 05/05/2020 14:30   DG Abd 2 Views  Result Date: 05/06/2020 CLINICAL DATA:  Cholecystitis, post percutaneous cholecystostomy tube placement EXAM: ABDOMEN - 2 VIEW COMPARISON:  05/04/2020 and previous FINDINGS: Right upper quadrant cholecystostomy catheter projects in expected location. There is gas and fluid distention of the stomach. The small bowel is nondilated. Residual oral contrast material in the decompressed colon. Splenic arterial calcifications. Multilevel thoracolumbar spondylitic changes. Transvenous pacing leads partially visualized. Metallic seeds in the region of the prostate. IMPRESSION: Fluid and gas distention of the stomach. Electronically Signed   By: Lucrezia Europe M.D.   On: 05/06/2020 13:14   IR EXCHANGE BILIARY DRAIN  Result Date: 05/04/2020 INDICATION: Status post placement of percutaneous cholecystostomy tube yesterday to treat acute cholecystitis. The tube has become disrupted with separation of the drainage catheter from its external hub outside of the  patient's body. The catheter requires exchange. EXAM: PERCUTANEOUS CHOLECYSTOSTOMY TUBE EXCHANGE MEDICATIONS: None ANESTHESIA/SEDATION: None FLUOROSCOPY TIME:  Fluoroscopy Time: 42 seconds.  7.3 mGy. CONTRAST:  7 mL Visipaque 154 COMPLICATIONS: None immediate. PROCEDURE: Informed written consent was obtained from the patient after a thorough discussion of the procedural risks, benefits and alternatives. All questions were addressed. Maximal Sterile Barrier Technique was utilized including caps, mask, sterile gowns, sterile gloves, sterile drape, hand hygiene and skin antiseptic. A timeout was performed prior to the initiation of the procedure. The pre-existing cholecystostomy tube was cut, injected with contrast and removed over a guidewire. A new 10 French drainage catheter was then advanced over the wire and formed. Positioning of the catheter was confirmed by fluoroscopy after contrast injection. The catheter was then connected to a new gravity drainage bag and secured at the skin with a Prolene retention suture. FINDINGS: The new cholecystostomy tube was formed in the gallbladder lumen with return of bile. IMPRESSION: Exchange of 10 French cholecystostomy tube due to disruption of the previously placed catheter. A new 10 French catheter was placed and positioned in the gallbladder lumen. This was attached to gravity bag drainage. Electronically Signed   By: Aletta Edouard M.D.   On: 05/04/2020 16:31    Scheduled Meds: . cholecalciferol  2,000 Units Oral Daily  . finasteride  5 mg Oral Daily  . metoprolol succinate  25 mg Oral Daily  . pantoprazole  40 mg Oral Daily  . sodium chloride flush  5 mL Intracatheter Q8H   Continuous Infusions: . lactated ringers 75 mL/hr at 05/06/20 1448  . metronidazole Stopped (05/06/20 1241)  . potassium chloride 10 mEq (05/06/20 1448)  . vancomycin       LOS: 4 days   Time spent: 40 minutes.  Lorella Nimrod, MD [facility]  If 7PM-7AM, please contact  night-coverage [email or web address]  05/06/2020, 3:01 PM   This record has been created using Systems analyst. Errors have been sought and corrected,but may not always be located. Such creation errors do not reflect on the standard of care.

## 2020-05-07 LAB — COMPREHENSIVE METABOLIC PANEL
ALT: 110 U/L — ABNORMAL HIGH (ref 0–44)
AST: 31 U/L (ref 15–41)
Albumin: 2.2 g/dL — ABNORMAL LOW (ref 3.5–5.0)
Alkaline Phosphatase: 137 U/L — ABNORMAL HIGH (ref 38–126)
Anion gap: 9 (ref 5–15)
BUN: 37 mg/dL — ABNORMAL HIGH (ref 8–23)
CO2: 23 mmol/L (ref 22–32)
Calcium: 8 mg/dL — ABNORMAL LOW (ref 8.9–10.3)
Chloride: 105 mmol/L (ref 98–111)
Creatinine, Ser: 1.98 mg/dL — ABNORMAL HIGH (ref 0.61–1.24)
GFR calc Af Amer: 33 mL/min — ABNORMAL LOW (ref >60)
GFR calc non Af Amer: 29 mL/min — ABNORMAL LOW (ref >60)
Glucose, Bld: 88 mg/dL (ref 70–99)
Potassium: 2.9 mmol/L — ABNORMAL LOW (ref 3.5–5.1)
Sodium: 137 mmol/L (ref 135–145)
Total Bilirubin: 2.3 mg/dL — ABNORMAL HIGH (ref 0.3–1.2)
Total Protein: 4.8 g/dL — ABNORMAL LOW (ref 6.5–8.1)

## 2020-05-07 LAB — CBC
HCT: 39.3 % (ref 39.0–52.0)
Hemoglobin: 14.1 g/dL (ref 13.0–17.0)
MCH: 31 pg (ref 26.0–34.0)
MCHC: 35.9 g/dL (ref 30.0–36.0)
MCV: 86.4 fL (ref 80.0–100.0)
Platelets: 206 10*3/uL (ref 150–400)
RBC: 4.55 MIL/uL (ref 4.22–5.81)
RDW: 14.5 % (ref 11.5–15.5)
WBC: 12.2 10*3/uL — ABNORMAL HIGH (ref 4.0–10.5)
nRBC: 0 % (ref 0.0–0.2)

## 2020-05-07 LAB — MAGNESIUM: Magnesium: 1.7 mg/dL (ref 1.7–2.4)

## 2020-05-07 MED ORDER — POTASSIUM CHLORIDE 10 MEQ/100ML IV SOLN
10.0000 meq | INTRAVENOUS | Status: AC
  Administered 2020-05-07 (×6): 10 meq via INTRAVENOUS
  Filled 2020-05-07 (×5): qty 100

## 2020-05-07 MED ORDER — ENOXAPARIN SODIUM 30 MG/0.3ML ~~LOC~~ SOLN
30.0000 mg | SUBCUTANEOUS | Status: DC
  Administered 2020-05-07 – 2020-05-08 (×2): 30 mg via SUBCUTANEOUS
  Filled 2020-05-07 (×2): qty 0.3

## 2020-05-07 MED ORDER — MAGNESIUM SULFATE 2 GM/50ML IV SOLN
2.0000 g | Freq: Once | INTRAVENOUS | Status: AC
  Administered 2020-05-07: 2 g via INTRAVENOUS
  Filled 2020-05-07: qty 50

## 2020-05-07 NOTE — Progress Notes (Signed)
NGT placed by Doroteo Bradford RN at change of shift last night, about one hour later patient had pulled NGT out. Daughter was at the bedside when patient pulled NGT out. Night shift RN was able to to reinsert NGT with no difficulty. Patient wants NGT removed as soon as possible.Patient educated on the need for the NGT. Jose Castro

## 2020-05-07 NOTE — Progress Notes (Signed)
PROGRESS NOTE    Jose Castro  HKV:[id number] DOB: Apr 04, 1926 DOA: 05/02/2020 PCP: Steele Sizer, MD   Brief Narrative:  Jose Castro  is a 86 y.o. male with a known history of hypertension, dyslipidemia, BPH, CVA, GERD and osteoarthritis who was directly admitted for epigastric pain and nausea with dry heaves since last night and that was preceded by an ER visit for substernal chest pain yesterday when he was ruled out ACS and discharged.  Today he was seen by his primary care physician who discussed the case with Dr. Tollie Pizza for direct admission. CT abdomen with gallbladder distention and thickening with probable small layering of stones.  Findings were concerning for cholecystitis.  CMP with transaminitis and elevated T bili,  concerning for obstructive jaundice. GI was also consulted for ERCP but patient has to be off from Eliquis for more than 48 hours for procedure.  Cholecystostomy tube was placed by IR today.  Subjective: Patient developed some nausea and vomiting yesterday and resulted in NG tube placement by surgery due to gastric distention.  Denies any pain today.  Assessment & Plan:   Active Problems:   Acute cholecystitis  Acute cholecystitis.  Patient afebrile with  improvement in leukocytosis.  Patient had cystostomy tube in place by IR it was changed 05/05/20 due to some leakage.  Improvement in his labs.  Tube draining yellow-colored fluid. Surgery is following-appreciate their recommendations.  Bile cultures with Enterococcus faecalis and Enterococcus faecium. Liver enzymes continue to improve. -Cefepime was switched with vancomycin according to sensitivity report. -Continue Flagyl. -Patient can be discharged home on Augmentin for another 7 to 10 days if he becomes stable without NG tube and surgery does not want to proceed with cholecystectomy at this time.  Discussed case with Dr. Megan Salon from [facility] on phone.  Nausea and vomiting.  There was some concern of ileus.   Abdominal x-ray with normal bowel and distended stomach.  Was placed by surgery yesterday evening with good drainage. -Continue NG tube-surgery is managing. -Continue to monitor. -Zofran and Phenergan as needed.  Electrolyte abnormalities.  Patient develops recurrent hypokalemia and hypomagnesemia. -Replete electrolytes as needed.  AKI.  Patient appears dry.  Creatinine started trending down, 1.9 today. baseline around 1.3.  Bladder scan with about 200 cc but that was not post voidal.  Renal ultrasound without any hydronephrosis.  Did receive some contrast for CT abdomen on the day of admission. -IV hydration. -Continue to monitor  -Avoid nephrotoxins  Chronic atrial fibrillation, status post pacemaker placement. -We will continue Toprol-XL. -hold Eliquis today at surgery request as he might need some intervention sooner.    Dyslipidemia with significantly elevated LFTs likely related to acute hepatitis. -Holding Crestor for now as well as fish oil, given elevated LFTs.   BPH. -Flomax will be resumed.    History of gout. No acute flareup.  Objective: Vitals:   05/06/20 1252 05/06/20 2219 05/07/20 0631 05/07/20 1208  BP: 119/78 (!) 156/81 (!) 155/75 (!) 153/83  Pulse: 60 60 60 60  Resp: 16 16 20 18   Temp: 98.2 F (36.8 C) 97.7 F (36.5 C) 97.8 F (36.6 C) 97.7 F (36.5 C)  TempSrc: Axillary Oral Oral   SpO2: 95% 94% 92% 93%  Weight:      Height:        Intake/Output Summary (Last 24 hours) at 05/07/2020 1500 Last data filed at 05/07/2020 1052 Gross per 24 hour  Intake 1582.48 ml  Output 3540 ml  Net -1957.52 ml   Danley Danker  Weights   05/03/20 0218  Weight: 78.4 kg    Examination:  General exam: Appears calm and comfortable.  NG-tube is in place Respiratory system: Clear to auscultation. Respiratory effort normal. Cardiovascular system: S1 & S2 heard, RRR. No JVD, murmurs, rubs,  Gastrointestinal system: Soft, nontender, nondistended, bowel sounds positive.RUQ  drainage tube in place. Central nervous system: Alert and oriented. No focal neurological deficits. Extremities: No edema, no cyanosis, pulses intact and symmetrical. Skin: No rashes, lesions or ulcers Psychiatry: Judgement and insight appear normal.   DVT prophylaxis: SCDs Code Status: Full Family Communication: Daughter was updated at bedside. Disposition Plan:  Status is: Inpatient  Remains inpatient appropriate because:IV treatments appropriate due to intensity of illness or inability to take PO   Dispo: The patient is from: Home              Anticipated d/c is to: Home              Anticipated d/c date is: 1 day.              Patient currently is not medically stable to d/c.  Patient developed nausea and some vomiting.  Concern of ileus.  Surgery placed the NG tube. No clear plan for surgical intervention at this time.  Consultants:   Surgery  IR  GI  Procedures:  IR guided Cholicystostomy tube placement.  Antimicrobials:  Flagyl Vancomycin  Data Reviewed: I have personally reviewed following labs and imaging studies  CBC: Recent Labs  Lab 05/02/20 1225 05/02/20 1225 05/03/20 0555 05/04/20 0418 05/05/20 0528 05/06/20 0710 05/07/20 0604  WBC 24.2*   < > 23.0* 13.2* 10.3 13.6* 12.2*  NEUTROABS 21,659*  --   --  11.4*  --  10.6*  --   HGB 14.8   < > 13.8 13.2 13.4 15.8 14.1  HCT 44.2   < > 39.1 39.2 38.6* 44.7 39.3  MCV 93.4   < > 89.5 91.0 87.9 86.5 86.4  PLT 247   < > 195 167 183 226 206   < > = values in this interval not displayed.   Basic Metabolic Panel: Recent Labs  Lab 05/03/20 0555 05/04/20 0418 05/05/20 0528 05/06/20 0710 05/07/20 0604  NA 131* 134* 137 139 137  K 4.1 4.0 4.0 3.0* 2.9*  CL 101 104 109 104 105  CO2 24 22 23 22 23   GLUCOSE 125* 134* 112* 94 88  BUN 32* 37* 35* 37* 37*  CREATININE 1.82* 2.17* 2.19* 2.19* 1.98*  CALCIUM 8.0* 7.8* 8.0* 8.6* 8.0*  MG 1.5*  --   --  1.9 1.7   GFR: Estimated Creatinine Clearance: 25.9  mL/min (A) (by C-G formula based on SCr of 1.98 mg/dL (H)). Liver Function Tests: Recent Labs  Lab 05/03/20 0555 05/04/20 0418 05/05/20 0528 05/06/20 0710 05/07/20 0604  AST 420* 177* 126* 59* 31  ALT 536* 321* 238* 183* 110*  ALKPHOS 142* 164* 186* 184* 137*  BILITOT 5.5* 4.7* 2.3* 2.6* 2.3*  PROT 5.4* 5.1* 5.0* 5.9* 4.8*  ALBUMIN 2.8* 2.5* 2.4* 2.7* 2.2*   Recent Labs  Lab 05/01/20 1701  LIPASE 46   No results for input(s): AMMONIA in the last 168 hours. Coagulation Profile: Recent Labs  Lab 05/02/20 1953  INR 2.0*   Cardiac Enzymes: No results for input(s): CKTOTAL, CKMB, CKMBINDEX, TROPONINI in the last 168 hours. BNP (last 3 results) No results for input(s): PROBNP in the last 8760 hours. HbA1C: No results for input(s): HGBA1C in the last  72 hours. CBG: No results for input(s): GLUCAP in the last 168 hours. Lipid Profile: No results for input(s): CHOL, HDL, LDLCALC, TRIG, CHOLHDL, LDLDIRECT in the last 72 hours. Thyroid Function Tests: No results for input(s): TSH, T4TOTAL, FREET4, T3FREE, THYROIDAB in the last 72 hours. Anemia Panel: No results for input(s): VITAMINB12, FOLATE, FERRITIN, TIBC, IRON, RETICCTPCT in the last 72 hours. Sepsis Labs: No results for input(s): PROCALCITON, LATICACIDVEN in the last 168 hours.  Recent Results (from the past 240 hour(s))  SARS Coronavirus 2 by RT PCR (hospital order, performed in [facility] hospital lab) Nasopharyngeal Nasopharyngeal Swab     Status: None   Collection Time: 05/03/20 10:25 AM   Specimen: Nasopharyngeal Swab  Result Value Ref Range Status   SARS Coronavirus 2 NEGATIVE NEGATIVE Final    Comment: (NOTE) SARS-CoV-2 target nucleic acids are NOT DETECTED. The SARS-CoV-2 RNA is generally detectable in upper and lower respiratory specimens during the acute phase of infection. The lowest concentration of SARS-CoV-2 viral copies this assay can detect is 250 copies / mL. A negative result does not preclude  SARS-CoV-2 infection and should not be used as the sole basis for treatment or other patient management decisions.  A negative result may occur with improper specimen collection / handling, submission of specimen other than nasopharyngeal swab, presence of viral mutation(s) within the areas targeted by this assay, and inadequate number of viral copies (<250 copies / mL). A negative result must be combined with clinical observations, patient history, and epidemiological information. Fact Sheet for Patients:   StrictlyIdeas.no Fact Sheet for Healthcare Providers: BankingDealers.co.za This test is not yet approved or cleared  by the [place] FDA and has been authorized for detection and/or diagnosis of SARS-CoV-2 by FDA under an Emergency Use Authorization (EUA).  This EUA will remain in effect (meaning this test can be used) for the duration of the COVID-19 declaration under Section 564(b)(1) of the Act, 21 U.S.C. section 360bbb-3(b)(1), unless the authorization is terminated or revoked sooner. Performed at [facility], [address] [place], [place] [address]   Aerobic/Anaerobic Culture (surgical/deep wound)     Status: None (Preliminary result)   Collection Time: 05/03/20 12:51 PM   Specimen: Gallbladder; Bile  Result Value Ref Range Status   Specimen Description   Final    GALL BLADDER Performed at [facility], [address], [place], [place] [address]    Special Requests   Final    Normal Performed at [facility], [address] [place], [place] [address]    Gram Stain   Final    RARE WBC PRESENT,BOTH PMN AND MONONUCLEAR FEW GRAM POSITIVE COCCI IN CHAINS Performed at [facility], [place] [address] [place], [place] [address]    Culture   Final    ABUNDANT ENTEROCOCCUS FAECIUM ABUNDANT ENTEROCOCCUS FAECALIS NO ANAEROBES ISOLATED; CULTURE IN PROGRESS FOR 5 DAYS    Report Status  PENDING  Incomplete   Organism ID, Bacteria ENTEROCOCCUS FAECIUM  Final   Organism ID, Bacteria ENTEROCOCCUS FAECALIS  Final      Susceptibility   Enterococcus faecalis - MIC*    AMPICILLIN <=2 SENSITIVE Sensitive     VANCOMYCIN 2 SENSITIVE Sensitive     GENTAMICIN SYNERGY SENSITIVE Sensitive     * ABUNDANT ENTEROCOCCUS FAECALIS   Enterococcus faecium - MIC*    AMPICILLIN <=2 SENSITIVE Sensitive     VANCOMYCIN 1 SENSITIVE Sensitive     GENTAMICIN SYNERGY SENSITIVE Sensitive     *  ABUNDANT ENTEROCOCCUS FAECIUM     Radiology Studies: DG Abd 2 Views  Result Date: 05/06/2020 CLINICAL DATA:  Cholecystitis, post percutaneous cholecystostomy tube placement EXAM: ABDOMEN - 2 VIEW COMPARISON:  05/04/2020 and previous FINDINGS: Right upper quadrant cholecystostomy catheter projects in expected location. There is gas and fluid distention of the stomach. The small bowel is nondilated. Residual oral contrast material in the decompressed colon. Splenic arterial calcifications. Multilevel thoracolumbar spondylitic changes. Transvenous pacing leads partially visualized. Metallic seeds in the region of the prostate. IMPRESSION: Fluid and gas distention of the stomach. Electronically Signed   By: Lucrezia Europe M.D.   On: 05/06/2020 13:14   DG Abd Portable 1V  Result Date: 05/06/2020 CLINICAL DATA:  Status post nasogastric tube placement. EXAM: PORTABLE ABDOMEN - 1 VIEW COMPARISON:  May 06, 2020 (8:50 p.m.) FINDINGS: A nasogastric tube is seen with its distal tip overlying the body of the stomach. A moderate amount of air is seen within the gastric lumen. The bowel gas pattern is otherwise normal. Radiopaque contrast is seen throughout the large bowel. A percutaneous cholecystostomy tube is again seen overlying the lateral aspect of the right upper quadrant. No radio-opaque calculi or other significant radiographic abnormality are seen. IMPRESSION: Nasogastric tube positioning, as described above. Electronically  Signed   By: Virgina Norfolk M.D.   On: 05/06/2020 22:28   DG Abd Portable 1V  Result Date: 05/06/2020 CLINICAL DATA:  Enteric catheter placement EXAM: PORTABLE ABDOMEN - 1 VIEW COMPARISON:  05/06/2020 at 10:51 a.m. FINDINGS: Frontal view of the lower chest and upper abdomen demonstrates dual lead pacemaker unchanged. Enteric catheter tip and side port project over the gastric fundus. Bowel gas pattern is unremarkable. Percutaneous cholecystostomy tube right upper quadrant. IMPRESSION: 1. Enteric catheter projecting over gastric fundus. 2. Stable percutaneous cholecystostomy tube. Electronically Signed   By: Randa Ngo M.D.   On: 05/06/2020 22:20    Scheduled Meds: . cholecalciferol  2,000 Units Oral Daily  . enoxaparin (LOVENOX) injection  30 mg Subcutaneous Q24H  . finasteride  5 mg Oral Daily  . metoprolol succinate  25 mg Oral Daily  . pantoprazole (PROTONIX) IV  40 mg Intravenous Q24H  . sodium chloride flush  5 mL Intracatheter Q8H   Continuous Infusions: . lactated ringers Stopped (05/07/20 0200)  . metronidazole 500 mg (05/07/20 1038)  . potassium chloride 10 mEq (05/07/20 1444)  . vancomycin Stopped (05/06/20 2240)     LOS: 5 days   Time spent: 45 minutes.  Lorella Nimrod, MD [facility]  If 7PM-7AM, please contact night-coverage [email or web address]  05/07/2020, 3:00 PM   This record has been created using Systems analyst. Errors have been sought and corrected,but may not always be located. Such creation errors do not reflect on the standard of care.

## 2020-05-07 NOTE — Plan of Care (Signed)
Patient doing well.  No complaints of pain.  NG put out 1000cc from NG tube.  Biliary drain put out 170.  Ambulated twice this shift.  No complaints of nausea.  Continue to monitor.

## 2020-05-07 NOTE — Consult Note (Signed)
Pharmacy Antibiotic Note  Jose Castro is a 86 y.o. male admitted on 05/02/2020 with cholecystitis. Cx taking of Bile from Gallbladder grew abundant Enterococcus faecium and faecalis.   Pharmacy has been consulted for vancomycin  dosing. The patient confirms rash w/ penicillins.   Plan:     Vancomycin 1500 mg IV x 1 dose on 6/5 Continue vancomycin 750 mg IV every 24 hours.   Continue Flagyl 500 mg IV every 8 hours   Will continue to monitor renal function.   Height: 5\' 11"  (180.3 cm) Weight: 78.4 kg (172 lb 13.5 oz) IBW/kg (Calculated) : 75.3  Temp (24hrs), Avg:98 F (36.7 C), Min:97.7 F (36.5 C), Max:98.2 F (36.8 C)  Recent Labs  Lab 05/03/20 0555 05/04/20 0418 05/05/20 0528 05/06/20 0710 05/07/20 0604  WBC 23.0* 13.2* 10.3 13.6* 12.2*  CREATININE 1.82* 2.17* 2.19* 2.19* 1.98*    Estimated Creatinine Clearance: 25.9 mL/min (A) (by C-G formula based on SCr of 1.98 mg/dL (H)).    Allergies  Allergen Reactions  . Penicillins Itching and Swelling    Has patient had a PCN reaction causing immediate rash, facial/tongue/throat swelling, SOB or lightheadedness with hypotension: No Has patient had a PCN reaction causing severe rash involving mucus membranes or skin necrosis: No Has patient had a PCN reaction that required hospitalization: No Has patient had a PCN reaction occurring within the last 10 years: No If all of the above answers are "NO", then may proceed with Cephalosporin use.     Antimicrobials this admission: ciprofloxacin 6/2 x 1  cefepime 6/1 >> 6/5 Metronidazole 6/2 >> Vancomycin 6/5>>  Microbiology results: [facility] from gallbladder collected 6/3: Enterococcus faecium and faecalis    Thank you for allowing pharmacy to be a part of this patient's care.  Rocky Morel, PharmD, BCPS Clinical Pharmacist 05/07/2020 9:30 AM

## 2020-05-07 NOTE — Care Management Important Message (Signed)
Important Message  Patient Details  Name: Jose Castro MRN: [id number] Date of Birth: Apr 20, 1926   Medicare Important Message Given:  Yes     Dannette Barbara 05/07/2020, 11:54 AM

## 2020-05-07 NOTE — Progress Notes (Signed)
Patient seen this AM and again late afternoon.  Marked return on NG placement, unfortunately patient removed x 1.  Now a little more on board with its benefit.  1900+ cc drained.  Bile drain functioning well. Clear bile. No blood.    ABD:: Much softer, still slightly tympanitic.  Labs: Generally improved with normalization of transaminases. Bili still up at 2.3 (total). WBC trending down.  HGB still up a point, suggesting need for ongoing IV luids.   NG drainage shows some bile staining, suggesting at least partial patency of the CBD.  Plan at present from my part is to await return of gastric function, feeding trail and d/c home w/ biliary drainage catheter.  He has been fairly stressed this admission. If possible, a return engagement should surgery be required would likely be better tolerated.

## 2020-05-08 ENCOUNTER — Ambulatory Visit: Payer: Medicare Other

## 2020-05-08 ENCOUNTER — Inpatient Hospital Stay: Payer: Medicare Other

## 2020-05-08 DIAGNOSIS — K8000 Calculus of gallbladder with acute cholecystitis without obstruction: Secondary | ICD-10-CM

## 2020-05-08 LAB — COMPREHENSIVE METABOLIC PANEL
ALT: 83 U/L — ABNORMAL HIGH (ref 0–44)
AST: 31 U/L (ref 15–41)
Albumin: 2.5 g/dL — ABNORMAL LOW (ref 3.5–5.0)
Alkaline Phosphatase: 126 U/L (ref 38–126)
Anion gap: 12 (ref 5–15)
BUN: 33 mg/dL — ABNORMAL HIGH (ref 8–23)
CO2: 21 mmol/L — ABNORMAL LOW (ref 22–32)
Calcium: 8.1 mg/dL — ABNORMAL LOW (ref 8.9–10.3)
Chloride: 107 mmol/L (ref 98–111)
Creatinine, Ser: 1.8 mg/dL — ABNORMAL HIGH (ref 0.61–1.24)
GFR calc Af Amer: 37 mL/min — ABNORMAL LOW (ref >60)
GFR calc non Af Amer: 32 mL/min — ABNORMAL LOW (ref >60)
Glucose, Bld: 79 mg/dL (ref 70–99)
Potassium: 3.2 mmol/L — ABNORMAL LOW (ref 3.5–5.1)
Sodium: 140 mmol/L (ref 135–145)
Total Bilirubin: 2.9 mg/dL — ABNORMAL HIGH (ref 0.3–1.2)
Total Protein: 5.1 g/dL — ABNORMAL LOW (ref 6.5–8.1)

## 2020-05-08 LAB — AEROBIC/ANAEROBIC CULTURE W GRAM STAIN (SURGICAL/DEEP WOUND): Special Requests: NORMAL

## 2020-05-08 LAB — CBC
HCT: 41.5 % (ref 39.0–52.0)
Hemoglobin: 14.6 g/dL (ref 13.0–17.0)
MCH: 31.5 pg (ref 26.0–34.0)
MCHC: 35.2 g/dL (ref 30.0–36.0)
MCV: 89.4 fL (ref 80.0–100.0)
Platelets: 205 10*3/uL (ref 150–400)
RBC: 4.64 MIL/uL (ref 4.22–5.81)
RDW: 14.4 % (ref 11.5–15.5)
WBC: 11.7 10*3/uL — ABNORMAL HIGH (ref 4.0–10.5)
nRBC: 0 % (ref 0.0–0.2)

## 2020-05-08 LAB — MAGNESIUM: Magnesium: 2 mg/dL (ref 1.7–2.4)

## 2020-05-08 MED ORDER — BOOST / RESOURCE BREEZE PO LIQD CUSTOM
1.0000 | Freq: Three times a day (TID) | ORAL | Status: DC
  Administered 2020-05-08 – 2020-05-10 (×6): 1 via ORAL

## 2020-05-08 MED ORDER — POTASSIUM CHLORIDE 10 MEQ/100ML IV SOLN
10.0000 meq | INTRAVENOUS | Status: AC
  Administered 2020-05-08 (×3): 10 meq via INTRAVENOUS
  Filled 2020-05-08 (×2): qty 100

## 2020-05-08 MED ORDER — IOHEXOL 300 MG/ML  SOLN: 50.0000 mL | Freq: Once | INTRAMUSCULAR | Status: DC | PRN

## 2020-05-08 NOTE — Progress Notes (Signed)
Patient returned from cholangiogram performed by IR today IMPRESSION: 1. Appropriately positioned and functioning cholecystostomy tube. No exchange performed. 2. Nonocclusive filling defect within the distal aspect of the CBD worrisome for nonocclusive choledocholithiasis.  Recs: Tentative plan to perform ERCP on Friday by Dr. De Hollingshead with full liquid diet Due to rising bilirubin, recommend to perform blood cultures Patient is currently on vancomycin and metronidazole Discussed my recommendations with patient's daughter  Cephas Darby, MD [address]  [place]  [place], [place] [address]  Main: [phone]  Fax: [phone] Pager: [phone]

## 2020-05-08 NOTE — Evaluation (Signed)
Physical Therapy Evaluation Patient Details Name: Jose Castro MRN: [id number] DOB: 1926/11/22 Today's Date: 05/08/2020   History of Present Illness  86 y.o. male who was admitted to [facility] with Acute Cholecystitis with enterococcus, faecium, and Faecalis S/P IR Biliary drain. PMHx includes: HTN, Dyslipidemia, BPH, CVA, GERD  Clinical Impression  Pt recently with NGtube removed, feeling well enough to participate with PT and ultimately was able to circumambulate the nurses' station with a walker and did not show any overt safety or LOBs issues t/o the session.  Daughter present and helpful t/o the session and encouraging pt to participate as able.  Pt has 24/7 assistance from family in his home and should be able to return home safely once medically cleared for d/c.  Pt will benefit from HHPT to get back to his baseline function.    Follow Up Recommendations Home health PT    Equipment Recommendations  None recommended by PT    Recommendations for Other Services       Precautions / Restrictions Precautions Precautions: Fall Restrictions Weight Bearing Restrictions: No      Mobility  Bed Mobility Overal bed mobility: Modified Independent             General bed mobility comments: Pt slow to get himself to sitting (accounting for drains/IVs/etc) but did not need phyiscal assist  Transfers Overall transfer level: Needs assistance Equipment used: Rolling walker (2 wheeled) Transfers: Sit to/from Stand Sit to Stand: Min guard         General transfer comment: cuing and reinforcement for hand placement, sequencing but able to rise w/o phyiscal assist  Ambulation/Gait Ambulation/Gait assistance: Min guard Gait Distance (Feet): 200 Feet Assistive device: Rolling walker (2 wheeled)       General Gait Details: Pt initially with slow and gradually increasing to (per daughter "about baseline") with increased time and some minimal cuing.  Pt tends to stay hunched/forward  flexed, but was able to circumambulate the entire nurses station w/o rest break and no LOBs or overt safety issues  Stairs            Wheelchair Mobility    Modified Rankin (Stroke Patients Only)       Balance Overall balance assessment: Modified Independent                                           Pertinent Vitals/Pain Pain Assessment: 0-10 Pain Score: 4  Pain Location: R rib cage    Home Living Family/patient expects to be discharged to:: Private residence Living Arrangements: Alone;Other relatives Available Help at Discharge: Available 24 hours/day;Family Type of Home: House Home Access: Stairs to enter Entrance Stairs-Rails: None Entrance Stairs-Number of Steps: 2-3 steps to reach porch Home Layout: Two level Home Equipment: Bedside commode;Shower seat;Walker - 4 wheels;Walker - 2 wheels;Cane - single point      Prior Function Level of Independence: Needs assistance   Gait / Transfers Assistance Needed: Pt generally able to ambulate as needed in the home with walker, often does not need walker with someone close  ADL's / Homemaking Assistance Needed: Family supervises basic ADLs, and assists pt. with all IADLs. Pt. is able to perfrom light meal prep with supervision.        Hand Dominance   Dominant Hand: Right    Extremity/Trunk Assessment   Upper Extremity Assessment Upper Extremity Assessment: Overall WFL for tasks  assessed;Generalized weakness(age appropriate defecits)    Lower Extremity Assessment Lower Extremity Assessment: Overall WFL for tasks assessed;Generalized weakness(age appropriate defecits)       Communication   Communication: HOH  Cognition Arousal/Alertness: Awake/alert Behavior During Therapy: Impulsive Overall Cognitive Status: History of cognitive impairments - at baseline                                 General Comments: per daughter (present) he seems a little off from baseline having been  in the hospital for a while now      General Comments      Exercises     Assessment/Plan    PT Assessment    PT Problem List         PT Treatment Interventions      PT Goals (Current goals can be found in the Care Plan section)  Acute Rehab PT Goals Patient Stated Goal: To go home PT Goal Formulation: With patient/family Time For Goal Achievement: 05/22/20 Potential to Achieve Goals: Good    Frequency Min 2X/week   Barriers to discharge        Co-evaluation               AM-PAC PT "6 Clicks" Mobility  Outcome Measure Help needed turning from your back to your side while in a flat bed without using bedrails?: None Help needed moving from lying on your back to sitting on the side of a flat bed without using bedrails?: None Help needed moving to and from a bed to a chair (including a wheelchair)?: A Little Help needed standing up from a chair using your arms (e.g., wheelchair or bedside chair)?: A Little Help needed to walk in hospital room?: A Little Help needed climbing 3-5 steps with a railing? : A Little 6 Click Score: 20    End of Session Equipment Utilized During Treatment: Gait belt Activity Tolerance: Patient tolerated treatment well Patient left: with chair alarm set;with call bell/phone within reach;with family/visitor present Nurse Communication: Mobility status PT Visit Diagnosis: Muscle weakness (generalized) (M62.81);Difficulty in walking, not elsewhere classified (R26.2)    Time: 2585-2778 PT Time Calculation (min) (ACUTE ONLY): 30 min   Charges:   PT Evaluation $PT Eval Low Complexity: 1 Low PT Treatments $Gait Training: 8-22 mins        Kreg Shropshire, DPT 05/08/2020, 6:18 PM

## 2020-05-08 NOTE — Progress Notes (Signed)
PROGRESS NOTE    Jose Castro  TDV:[id number] DOB: 01-Feb-1926 DOA: 05/02/2020 PCP: Steele Sizer, MD   Brief Narrative:  Jose Castro  is a 86 y.o. Caucasian male with a known history of hypertension, dyslipidemia, BPH, CVA, GERD and osteoarthritis who was directly admitted for epigastric pain and nausea with dry heaves since last night and that was preceded by an ER visit for substernal chest pain yesterday when he was ruled out ACS and discharged.  Today he was seen by his primary care physician who discussed the case with Dr. Tollie Pizza for direct admission. CT abdomen with gallbladder distention and thickening with probable small layering of stones.  Findings were concerning for cholecystitis.  CMP with transaminitis and elevated T bili,  concerning for obstructive jaundice. GI was also consulted for ERCP but patient has to be off from Eliquis for more than 48 hours for procedure.  Cholecystostomy tube was placed by IR today.  Subjective: Pt complained of being thirsty while on NG tube clamping trial.  No fever, dyspnea, chest pain, diarrhea, dysuria.   Assessment & Plan:   Active Problems:   Acute cholecystitis  Acute cholecystitis s/p cholecystostomy tube Patient afebrile with  improvement in leukocytosis.  Patient had cystostomy tube in place by IR it was changed 05/05/20 due to some leakage.  Improvement in his labs.  Tube draining yellow-colored fluid. Surgery is following Bile cultures with Enterococcus faecalis and Enterococcus faecium. PLAN: -continue vanc/flagyl per sensitivity  --IR cholangiogram today, which showed "Nonocclusive filling defect within the distal aspect of the CBD worrisome for nonocclusive choledocholithiasis." --Tentative plan to perform ERCP on Friday by Dr. Allen Norris --full liquid diet --Due to rising bilirubin, obtain blood cultures  Nausea and vomiting 2/2 ileus, resolved Abdominal x-ray with normal bowel and distended stomach.  NG tube was placed by surgery  with good output. PLAN: -pt passed NG tube clamping trial, NG tube removed --start Full liquid diet -Continue to monitor. -Zofran and Phenergan as needed.  Electrolyte abnormalities.  Patient develops recurrent hypokalemia and hypomagnesemia. -Replete electrolytes as needed.  AKI.  Patient appears dry.  Creatinine started trending down, 1.9 today. baseline around 1.3.  Bladder scan with about 200 cc but that was not post voidal.  Renal ultrasound without any hydronephrosis.  Did receive some contrast for CT abdomen on the day of admission. -continue IV hydration.  Chronic atrial fibrillation, status post pacemaker placement. -We will continue Toprol-XL. -continue to hold Eliquis for upcoming ERCP    Dyslipidemia with significantly elevated LFTs likely related to acute hepatitis. -Holding Crestor for now as well as fish oil, given elevated LFTs.   BPH. -continue finasteride    History of gout. No acute flareup.   Objective: Vitals:   05/07/20 1208 05/07/20 2000 05/08/20 0404 05/08/20 1152  BP: (!) 153/83 (!) 141/79 (!) 167/73 (!) 161/74  Pulse: 60 60 60 (!) 59  Resp: 18 18 18 15   Temp: 97.7 F (36.5 C) 98.4 F (36.9 C) 97.9 F (36.6 C) 98.2 F (36.8 C)  TempSrc:  Oral Oral Oral  SpO2: 93% 94% 92% 94%  Weight:      Height:        Intake/Output Summary (Last 24 hours) at 05/08/2020 1916 Last data filed at 05/08/2020 1805 Gross per 24 hour  Intake 2133.13 ml  Output 1385 ml  Net 748.13 ml   Filed Weights   05/03/20 0218  Weight: 78.4 kg    Examination:  Constitutional: NAD, alert HEENT: conjunctivae and lids normal, EOMI,  hard of hearing CV: RRR no M,R,G. Distal pulses +2.  No cyanosis.   RESP: CTA B/L, normal respiratory effort  GI: +BS, NTND, NG tube present, chole tube draining clear brown fluid Extremities: No effusions, edema, or tenderness in BLE SKIN: warm, dry and intact Neuro: II - XII grossly intact.  Sensation intact    DVT prophylaxis:  SCDs Code Status: Full Family Communication: Daughter updated at bedside today Disposition Plan:  Status is: Inpatient  Remains inpatient appropriate because:IV treatments appropriate due to intensity of illness or inability to take PO   Dispo: The patient is from: Home              Anticipated d/c is to: Home              Anticipated d/c date is: after 6/11              Patient currently is not medically stable to d/c.ERCP planned on 6/11 due to choledocholithiasis.     Consultants:   Surgery  IR  GI  Procedures:  IR guided Cholicystostomy tube placement.  Antimicrobials:  Flagyl Vancomycin  Data Reviewed: I have personally reviewed following labs and imaging studies  CBC: Recent Labs  Lab 05/02/20 1225 05/03/20 0555 05/04/20 0418 05/05/20 0528 05/06/20 0710 05/07/20 0604 05/08/20 0441  WBC 24.2*   < > 13.2* 10.3 13.6* 12.2* 11.7*  NEUTROABS 21,659*  --  11.4*  --  10.6*  --   --   HGB 14.8   < > 13.2 13.4 15.8 14.1 14.6  HCT 44.2   < > 39.2 38.6* 44.7 39.3 41.5  MCV 93.4   < > 91.0 87.9 86.5 86.4 89.4  PLT 247   < > 167 183 226 206 205   < > = values in this interval not displayed.   Basic Metabolic Panel: Recent Labs  Lab 05/03/20 0555 05/03/20 0555 05/04/20 0418 05/05/20 0528 05/06/20 0710 05/07/20 0604 05/08/20 0441  NA 131*   < > 134* 137 139 137 140  K 4.1   < > 4.0 4.0 3.0* 2.9* 3.2*  CL 101   < > 104 109 104 105 107  CO2 24   < > 22 23 22 23  21*  GLUCOSE 125*   < > 134* 112* 94 88 79  BUN 32*   < > 37* 35* 37* 37* 33*  CREATININE 1.82*   < > 2.17* 2.19* 2.19* 1.98* 1.80*  CALCIUM 8.0*   < > 7.8* 8.0* 8.6* 8.0* 8.1*  MG 1.5*  --   --   --  1.9 1.7 2.0   < > = values in this interval not displayed.   GFR: Estimated Creatinine Clearance: 28.5 mL/min (A) (by C-G formula based on SCr of 1.8 mg/dL (H)). Liver Function Tests: Recent Labs  Lab 05/04/20 0418 05/05/20 0528 05/06/20 0710 05/07/20 0604 05/08/20 0441  AST 177* 126* 59* 31  31  ALT 321* 238* 183* 110* 83*  ALKPHOS 164* 186* 184* 137* 126  BILITOT 4.7* 2.3* 2.6* 2.3* 2.9*  PROT 5.1* 5.0* 5.9* 4.8* 5.1*  ALBUMIN 2.5* 2.4* 2.7* 2.2* 2.5*   No results for input(s): LIPASE, AMYLASE in the last 168 hours. No results for input(s): AMMONIA in the last 168 hours. Coagulation Profile: Recent Labs  Lab 05/02/20 1953  INR 2.0*   Cardiac Enzymes: No results for input(s): CKTOTAL, CKMB, CKMBINDEX, TROPONINI in the last 168 hours. BNP (last 3 results) No results for input(s): PROBNP in the last  8760 hours. HbA1C: No results for input(s): HGBA1C in the last 72 hours. CBG: No results for input(s): GLUCAP in the last 168 hours. Lipid Profile: No results for input(s): CHOL, HDL, LDLCALC, TRIG, CHOLHDL, LDLDIRECT in the last 72 hours. Thyroid Function Tests: No results for input(s): TSH, T4TOTAL, FREET4, T3FREE, THYROIDAB in the last 72 hours. Anemia Panel: No results for input(s): VITAMINB12, FOLATE, FERRITIN, TIBC, IRON, RETICCTPCT in the last 72 hours. Sepsis Labs: No results for input(s): PROCALCITON, LATICACIDVEN in the last 168 hours.  Recent Results (from the past 240 hour(s))  SARS Coronavirus 2 by RT PCR (hospital order, performed in [facility] hospital lab) Nasopharyngeal Nasopharyngeal Swab     Status: None   Collection Time: 05/03/20 10:25 AM   Specimen: Nasopharyngeal Swab  Result Value Ref Range Status   SARS Coronavirus 2 NEGATIVE NEGATIVE Final    Comment: (NOTE) SARS-CoV-2 target nucleic acids are NOT DETECTED. The SARS-CoV-2 RNA is generally detectable in upper and lower respiratory specimens during the acute phase of infection. The lowest concentration of SARS-CoV-2 viral copies this assay can detect is 250 copies / mL. A negative result does not preclude SARS-CoV-2 infection and should not be used as the sole basis for treatment or other patient management decisions.  A negative result may occur with improper specimen collection /  handling, submission of specimen other than nasopharyngeal swab, presence of viral mutation(s) within the areas targeted by this assay, and inadequate number of viral copies (<250 copies / mL). A negative result must be combined with clinical observations, patient history, and epidemiological information. Fact Sheet for Patients:   StrictlyIdeas.no Fact Sheet for Healthcare Providers: BankingDealers.co.za This test is not yet approved or cleared  by the [place] FDA and has been authorized for detection and/or diagnosis of SARS-CoV-2 by FDA under an Emergency Use Authorization (EUA).  This EUA will remain in effect (meaning this test can be used) for the duration of the COVID-19 declaration under Section 564(b)(1) of the Act, 21 U.S.C. section 360bbb-3(b)(1), unless the authorization is terminated or revoked sooner. Performed at [facility], [address] [place], [place] [address]   Aerobic/Anaerobic Culture (surgical/deep wound)     Status: None   Collection Time: 05/03/20 12:51 PM   Specimen: Gallbladder; Bile  Result Value Ref Range Status   Specimen Description   Final    GALL BLADDER Performed at [facility], [address] [place], [place] [address]    Special Requests   Final    Normal Performed at [facility], [place], [place] [address]    Gram Stain   Final    RARE WBC PRESENT,BOTH PMN AND MONONUCLEAR FEW GRAM POSITIVE COCCI IN CHAINS    Culture   Final    ABUNDANT ENTEROCOCCUS FAECIUM ABUNDANT ENTEROCOCCUS FAECALIS NO ANAEROBES ISOLATED Performed at [facility], [place] [address] [place], [place] [address]    Report Status 05/08/2020 FINAL  Final   Organism ID, Bacteria ENTEROCOCCUS FAECIUM  Final   Organism ID, Bacteria ENTEROCOCCUS FAECALIS  Final      Susceptibility   Enterococcus faecalis - MIC*    AMPICILLIN <=2 SENSITIVE Sensitive     VANCOMYCIN 2  SENSITIVE Sensitive     GENTAMICIN SYNERGY SENSITIVE Sensitive     * ABUNDANT ENTEROCOCCUS FAECALIS   Enterococcus faecium - MIC*    AMPICILLIN <=2 SENSITIVE Sensitive     VANCOMYCIN 1 SENSITIVE Sensitive     GENTAMICIN SYNERGY SENSITIVE Sensitive     *  ABUNDANT ENTEROCOCCUS FAECIUM     Radiology Studies: DG ABD ACUTE 2+V W 1V CHEST  Result Date: 05/08/2020 CLINICAL DATA:  Ileus, NG tube placement EXAM: DG ABDOMEN ACUTE W/ 1V CHEST COMPARISON:  05/06/2020 FINDINGS: NG tube is in the stomach. Oral contrast material seen within the colon. No evidence of bowel dilatation. Right upper quadrant drainage catheter in place, likely cholecystostomy tube. No organomegaly or free air. Mild elevation of the right hemidiaphragm. Right base atelectasis. Left lung clear. Heart is normal size. Left pacer in place. IMPRESSION: NG tube tip in the stomach.  No evidence of obstruction or ileus. Mild elevation of the right hemidiaphragm with right base atelectasis. Electronically Signed   By: Rolm Baptise M.D.   On: 05/08/2020 11:43   DG Abd Portable 1V  Result Date: 05/06/2020 CLINICAL DATA:  Status post nasogastric tube placement. EXAM: PORTABLE ABDOMEN - 1 VIEW COMPARISON:  May 06, 2020 (8:50 p.m.) FINDINGS: A nasogastric tube is seen with its distal tip overlying the body of the stomach. A moderate amount of air is seen within the gastric lumen. The bowel gas pattern is otherwise normal. Radiopaque contrast is seen throughout the large bowel. A percutaneous cholecystostomy tube is again seen overlying the lateral aspect of the right upper quadrant. No radio-opaque calculi or other significant radiographic abnormality are seen. IMPRESSION: Nasogastric tube positioning, as described above. Electronically Signed   By: Virgina Norfolk M.D.   On: 05/06/2020 22:28   DG Abd Portable 1V  Result Date: 05/06/2020 CLINICAL DATA:  Enteric catheter placement EXAM: PORTABLE ABDOMEN - 1 VIEW COMPARISON:  05/06/2020 at 10:51  a.m. FINDINGS: Frontal view of the lower chest and upper abdomen demonstrates dual lead pacemaker unchanged. Enteric catheter tip and side port project over the gastric fundus. Bowel gas pattern is unremarkable. Percutaneous cholecystostomy tube right upper quadrant. IMPRESSION: 1. Enteric catheter projecting over gastric fundus. 2. Stable percutaneous cholecystostomy tube. Electronically Signed   By: Randa Ngo M.D.   On: 05/06/2020 22:20   DG CHOLANGIOGRAM  EXISTING TUBE  Result Date: 05/08/2020 INDICATION: History of acute cholecystitis, post image guided placement of cholecystostomy tube 05/03/2020, subsequently exchanged on 05/04/2020, due to device malfunction. Given persistently elevated LFTs, request made for performance of a percutaneous cholangiogram via the existing cholecystostomy tube to evaluate for the presence of choledocholithiasis. EXAM: FLUOROSCOPIC GUIDED CHOLECYSTOSTOMY TUBE INJECTION COMPARISON:  CT abdomen pelvis-05/02/2020; CT-guided cholecystostomy tube placement-05/03/2020; fluoroscopic guided cholecystostomy tube exchange-05/04/2020 MEDICATIONS: None ANESTHESIA/SEDATION: None CONTRAST:  25 cc Omnipaque 300-administered into the gallbladder lumen via the existing cholecystostomy tube. FLUOROSCOPY TIME:  1 minutes, 36 seconds COMPLICATIONS: None immediate. PROCEDURE: The patient was positioned supine on the fluoroscopy table. A preprocedural spot fluoroscopic image was obtained of the right upper abdominal quadrant existing cholecystostomy tube. Multiple spot fluoroscopic radiographic images were obtained of the right upper abdominal quadrant and existing cholecystostomy tube following injection of a small amount of contrast. Images were reviewed and discussed with the patient. The cholecystostomy tube was flushed with a small amount of saline and reconnected to a gravity bag. A dressing was placed. The patient tolerated the procedure well without immediate postprocedural  complication. FINDINGS: Preprocedural spot fluoroscopic image of the right upper abdominal quadrant demonstrates grossly unchanged positioning of the cholecystostomy tube with end coiled and locked overlying the expected location of the gallbladder fundus. Subsequent contrast injection demonstrates appropriate functionality of the cholecystostomy tube with brisk opacification of the gallbladder. There are no discrete persistent filling defects within the gallbladder lumen to suggest the  presence of cholelithiasis. There is passage of contrast from the gallbladder through the cystic and common bile ducts, ultimately with faint opacification of the duodenum. There is a persistent nonocclusive filling defect within the distal aspect of the CBD worrisome for nonocclusive choledocholithiasis IMPRESSION: 1. Appropriately positioned and functioning cholecystostomy tube. No exchange performed. 2. Nonocclusive filling defect within the distal aspect of the CBD worrisome for nonocclusive choledocholithiasis. PLAN: - Further evaluation and management with ERCP could be performed as clinically indicated. - Alternatively, could consider repeat cholangiogram (with potential cholecystostomy tube capping trial) in 1 month to evaluate for spontaneous passage of the tiny suspected choledocholithiasis. Electronically Signed   By: Sandi Mariscal M.D.   On: 05/08/2020 16:35    Scheduled Meds: . cholecalciferol  2,000 Units Oral Daily  . enoxaparin (LOVENOX) injection  30 mg Subcutaneous Q24H  . feeding supplement  1 Container Oral TID BM  . finasteride  5 mg Oral Daily  . metoprolol succinate  25 mg Oral Daily  . pantoprazole (PROTONIX) IV  40 mg Intravenous Q24H  . sodium chloride flush  5 mL Intracatheter Q8H   Continuous Infusions: . lactated ringers Stopped (05/08/20 1753)  . metronidazole 500 mg (05/08/20 1753)  . vancomycin Stopped (05/07/20 2201)     LOS: 6 days    Enzo Bi, MD [facility]  If 7PM-7AM,  please contact night-coverage [email or web address]  05/08/2020, 7:16 PM   This record has been created using Systems analyst. Errors have been sought and corrected,but may not always be located. Such creation errors do not reflect on the standard of care.

## 2020-05-08 NOTE — Progress Notes (Signed)
OT Cancellation Note  Patient Details Name: Jose Castro MRN: [id number] DOB: [birth year]   Cancelled Treatment:    Reason Eval/Treat Not Completed: Patient at procedure or test/ unavailable(Pt. currently working with PT. Will reattempt initial OT eval at a later time or date.)  Harrel Carina, MS, OTR/L 05/08/2020, 4:31 PM

## 2020-05-08 NOTE — Evaluation (Signed)
Occupational Therapy Evaluation Patient Details Name: Jose Castro MRN: [id number] DOB: 10-19-1926 Today's Date: 05/08/2020    History of Present Illness Pt. is a 86 y.o. male who was admitted to [facility] with Acute Cholecystitis with enterococcus, faecium, and Faecalis S/P IR Biliary drain. PMHx includes: HTN, Dyslipidemia, BPH, CVA, GERD   Clinical Impression   Pt. presents with weakness, multiple IV lines, limited activity tolerance, and limited functional mobility which hinders his ability to perform ADL tasks at his prior level of function. Pt. resides at home alone, however has family stay with him 24 hours a day. Family supervises the pt. with daily ADL care needs, and assists pt. with all IADL tasks, and meals. Pt. was able to assist with light meal prep. Pt. Was able to perform modified LE dressing tasks with supervision. Pt. Could benefit from OT services for ADL training, A/E training, and pt./Caregiver education about safety awareness, home modification, and DME. Pt. Plans to return home upon discharge, per pt. daughter family will continue to assist pt. as needed. No follow-up OT services are warranted at this time. Will continue to assess follow-up OT discharge needs.     Follow Up Recommendations  No OT follow up    Equipment Recommendations       Recommendations for Other Services       Precautions / Restrictions Precautions Precautions: Fall Restrictions Weight Bearing Restrictions: No      Mobility Bed Mobility               General bed mobility comments: Pt. up in chair upon arrival finishing with PT  Transfers Overall transfer level: Needs assistance   Transfers: Sit to/from Stand Sit to Stand: Supervision         General transfer comment: Pt. requires assist for multiple IV lines    Balance                                           ADL either performed or assessed with clinical judgement   ADL Overall ADL's : Needs  assistance/impaired Eating/Feeding: Independent   Grooming: Set up;Independent           Upper Body Dressing : Set up;Supervision/safety;Sitting   Lower Body Dressing: Set up;Supervision/safety                       Vision Patient Visual Report: No change from baseline       Perception     Praxis      Pertinent Vitals/Pain Pain Assessment: No/denies pain     Hand Dominance Right   Extremity/Trunk Assessment Upper Extremity Assessment Upper Extremity Assessment: Overall WFL for tasks assessed           Communication Communication Communication: HOH   Cognition Arousal/Alertness: Awake/alert Behavior During Therapy: Impulsive                                       General Comments       Exercises     Shoulder Instructions      Home Living Family/patient expects to be discharged to:: Private residence Living Arrangements: Alone;Other relatives Available Help at Discharge: Family;Available 24 hours/day Type of Home: House Home Access: Stairs to enter [employer] of Steps: 2-3 steps to reach porch Entrance Stairs-Rails:  None Home Layout: Two level     Bathroom Shower/Tub: Tub/shower unit;Door   [employer] Toilet: Standard     Home Equipment: Bedside commode;Shower seat;Walker - 4 wheels          Prior Functioning/Environment Level of Independence: Needs assistance    ADL's / Homemaking Assistance Needed: Family supervises basic ADLs, and assists pt. with all IADLs. Pt. is able to perfrom light meal prep with supervision.            OT Problem List: Decreased strength;Decreased activity tolerance      OT Treatment/Interventions: Self-care/ADL training;Patient/family education;DME and/or AE instruction;Therapeutic activities;Therapeutic exercise    OT Goals(Current goals can be found in the care plan section) Acute Rehab OT Goals Patient Stated Goal: To go home OT Goal Formulation: With patient Time  For Goal Achievement: 05/20/20 Potential to Achieve Goals: Good  OT Frequency: Min 1X/week   Barriers to D/C:            Co-evaluation              AM-PAC OT "6 Clicks" Daily Activity     Outcome Measure Help from another person eating meals?: None Help from another person taking care of personal grooming?: None Help from another person toileting, which includes using toliet, bedpan, or urinal?: A Little Help from another person bathing (including washing, rinsing, drying)?: A Little Help from another person to put on and taking off regular upper body clothing?: None Help from another person to put on and taking off regular lower body clothing?: A Little 6 Click Score: 21   End of Session    Activity Tolerance: Patient tolerated treatment well Patient left: in chair  OT Visit Diagnosis: Muscle weakness (generalized) (M62.81)                Time: 9528-4132 OT Time Calculation (min): 35 min Charges:  OT General Charges $OT Visit: 1 Visit OT Evaluation $OT Eval Low Complexity: 1 Low  Harrel Carina, MS, OTR/L  Harrel Carina 05/08/2020, 5:46 PM

## 2020-05-08 NOTE — Progress Notes (Signed)
AVSS.  Comfortable.  NG volumes 1000/ 24 hours, but it appears most was during the first shift yesterday. Daughter reports he consumed a significant amount of ice and would drink if allowed. Lungs: Clear.  Sats: mid 90's. Cardio: RR ABD: Soft, scaphoid, Non-tender. Extrem: Soft. Labs: WBC trending down. HGB still above baseline. Serum transaminases almost normal . Bili still up, today at 2.0. Renal function ever so slowly improving. Plain films of the abdomen show a normal gas pattern. No evidence of lower lobe pneumonia on my review (looking for reasons of gastric atony).    Will clamp NG and see what gastric residuals occur.    Have asked Dr. Allen Norris for formal consult on role of ERCP for this patient.

## 2020-05-08 NOTE — Consult Note (Signed)
Cephas Darby, MD [address]  [place]  [place], [place] [address]  Main: [phone]  Fax: [phone] Pager: [phone]   Consultation  Referring Provider:     No ref. provider found Primary Care Physician:  Steele Sizer, MD Primary Gastroenterologist:  Dr. Sherri Sear          Reason for Consultation:     Elevated LFTs, evaluate for ERCP  Date of Admission:  05/02/2020 Date of Consultation:  05/08/2020         HPI:   Jose Castro is a 86 y.o. male the past medical history as detailed below, history of A. fib status post pacemaker placement on Eliquis, history of stroke on aspirin 325 mg daily who was admitted on 6/2 secondary to epigastric pain, nausea, found to have abnormal LFTs, AST 659, ALT 690, alkaline phosphatase 162, total bilirubin 6, diagnosed with acute cholecystitis based on ultrasound and CT scan.  There was no evidence of biliary dilation or choledocholithiasis based on the ultrasound and CT scan.  Subsequently, on 6/3 patient underwent CT-guided percutaneous biliary drainage procedure, drain placement, exchange of the catheter on 6/4.  Post drainage, his LFTs improved to alkaline phosphatase 186, AST 126, ALT 238, total bilirubin 2.3 on 6/5.  Since then, his transaminases have normalized except total bilirubin which is slightly increased to 2.9 today.  Gallbladder cultures were positive for Enterococcus faecium, therefore has been on IV vancomycin and metronidazole. GI is consulted to evaluate for ERCP.  Leukocytosis is improving.  Patient developed nausea and vomiting with gastric distention on 6/6, resulted in placement of NG tube for decompression.  Today, when I interviewed the patient, he denied any abdominal pain, nausea or vomiting.  He is tolerating liquids well.  He is also having bowel movements.  NG tube has been removed.  Patient's daughter is bedside.  Patient is sitting up in chair, comfortable and also working with PT  Aspirin and Eliquis  have been held since admission  NSAIDs: Aspirin 325 mg as patient  Antiplts/Anticoagulants/Anti thrombotics: Aspirin 325 mg and Eliquis for history of stroke, history of A. fib  GI Procedures: Colonoscopy 12/02/2007  Past Medical History:  Diagnosis Date  . Allergy    Penicillin  . Arthritis   . Basal cell carcinoma   . BPH (benign prostatic hyperplasia)   . Chronic kidney disease    had a kidney stone which per family was a cyst that was removed  . Dysrhythmia   . GERD (gastroesophageal reflux disease)   . History of kidney stones   . Hyperlipidemia   . Hypertension   . Left-sided low back pain with left-sided sciatica   . Splenic vein thrombosis   . Stroke ([place]) 05/22/2019  . Thrombosis 12/2015   mural  area and no notation of heart attack    Past Surgical History:  Procedure Laterality Date  . APPENDECTOMY    . CATARACT EXTRACTION, BILATERAL    . CYSTOSCOPY WITH INSERTION OF UROLIFT    . EYE SURGERY  08/2018  . IR EXCHANGE BILIARY DRAIN  05/04/2020  . KIDNEY STONE SURGERY    . KNEE SURGERY    . PACEMAKER INSERTION N/A 07/13/2018   Procedure: INSERTION PACEMAKER-DUEL CHAMBER INITIAL IMPLANT;  Surgeon: Isaias Cowman, MD;  Location: [facility] ORS;  Service: Cardiovascular;  Laterality: N/A;  . SPINE SURGERY      Prior to Admission medications   Medication Sig Start Date End Date Taking? Authorizing Provider  acetaminophen (TYLENOL)  325 MG tablet Take 2 tablets (650 mg total) by mouth every 4 (four) hours as needed for mild pain (or temp > 37.5 C (99.5 F)). Patient taking differently: Take 650 mg by mouth as needed for mild pain (or temp > 37.5 C (99.5 F)).  06/07/19   Angiulli, Lavon Paganini, PA-C  apixaban (ELIQUIS) 2.5 MG TABS tablet Take 1 tablet (2.5 mg total) by mouth 2 (two) times daily. 06/07/19   Angiulli, Lavon Paganini, PA-C  Cholecalciferol (VITAMIN D3) 50 MCG (2000 UT) TABS Take 2,000 Units by mouth daily. 06/07/19   Angiulli, Lavon Paganini, PA-C  finasteride (PROSCAR) 5 MG tablet  Take 1 tablet (5 mg total) by mouth daily. 06/07/19   Angiulli, Lavon Paganini, PA-C  metoprolol succinate (TOPROL XL) 25 MG 24 hr tablet Take 1 tablet (25 mg total) by mouth daily. 06/07/19   Angiulli, Lavon Paganini, PA-C  Omega-3 Fatty Acids (FISH OIL) 1000 MG CAPS Take 1,000 mg by mouth daily.     [provider]  ondansetron (ZOFRAN) 4 MG tablet Take 1 tablet (4 mg total) by mouth every 8 (eight) hours as needed for nausea or vomiting. 05/02/20   Steele Sizer, MD  pantoprazole (PROTONIX) 40 MG tablet Take 1 tablet (40 mg total) by mouth daily. 06/07/19   Angiulli, Lavon Paganini, PA-C  rosuvastatin (CRESTOR) 40 MG tablet Take 1 tablet (40 mg total) by mouth daily at 6 PM. 06/07/19   Angiulli, Lavon Paganini, PA-C    Current Facility-Administered Medications:  .  cholecalciferol (VITAMIN D3) tablet 2,000 Units, 2,000 Units, Oral, Daily, Mansy, Arvella Merles, MD, 2,000 Units at 05/08/20 1006 .  enoxaparin (LOVENOX) injection 30 mg, 30 mg, Subcutaneous, Q24H, Byrnett, Forest Gleason, MD, 30 mg at 05/07/20 1801 .  finasteride (PROSCAR) tablet 5 mg, 5 mg, Oral, Daily, Mansy, Jan A, MD, 5 mg at 05/08/20 1006 .  lactated ringers infusion, , Intravenous, Continuous, Amin, Soundra Pilon, MD, Last Rate: 75 mL/hr at 05/08/20 0540, Rate Verify at 05/08/20 0540 .  metoprolol succinate (TOPROL-XL) 24 hr tablet 25 mg, 25 mg, Oral, Daily, Mansy, Jan A, MD, 25 mg at 05/08/20 1006 .  metroNIDAZOLE (FLAGYL) IVPB 500 mg, 500 mg, Intravenous, Q8H, Amin, Soundra Pilon, MD, Last Rate: 100 mL/hr at 05/08/20 1007, 500 mg at 05/08/20 1007 .  morphine 2 MG/ML injection 2 mg, 2 mg, Intravenous, Q4H PRN, Mansy, Jan A, MD, 2 mg at 05/06/20 2122 .  ondansetron (ZOFRAN) tablet 4 mg, 4 mg, Oral, Q6H PRN **OR** ondansetron (ZOFRAN) injection 4 mg, 4 mg, Intravenous, Q6H PRN, Mansy, Jan A, MD, 4 mg at 05/06/20 0814 .  pantoprazole (PROTONIX) injection 40 mg, 40 mg, Intravenous, Q24H, Byrnett, Forest Gleason, MD, 40 mg at 05/07/20 1801 .  potassium chloride 10 mEq in 100 mL IVPB,  10 mEq, Intravenous, Q1 Hr x 3, Enzo Bi, MD, Last Rate: 100 mL/hr at 05/08/20 1239, 10 mEq at 05/08/20 1239 .  promethazine (PHENERGAN) injection 12.5 mg, 12.5 mg, Intravenous, Q6H PRN, Lorella Nimrod, MD, 12.5 mg at 05/06/20 0157 .  sodium chloride flush (NS) 0.9 % injection 5 mL, 5 mL, Intracatheter, Q8H, Aletta Edouard, MD, 5 mL at 05/08/20 0536 .  traZODone (DESYREL) tablet 25 mg, 25 mg, Oral, QHS PRN, Mansy, Jan A, MD .  vancomycin (VANCOREADY) IVPB 750 mg/150 mL, 750 mg, Intravenous, Q24H, Hallaji, Dani Gobble, RPH, Stopped at 05/07/20 2201  Family History  Problem Relation Age of Onset  . Cancer Brother        bladder cancer with  mets  . Heart disease Mother   . Aortic aneurysm Mother   . Heart attack Maternal Aunt   . Heart attack Maternal Uncle      Social History   Tobacco Use  . Smoking status: Former Smoker    Packs/day: 0.00    Years: 16.00    Pack years: 0.00    Types: Cigarettes    Start date: 10/21/1944    Quit date: 10/21/1960    Years since quitting: 59.5  . Smokeless tobacco: Never Used  Substance Use Topics  . Alcohol use: Yes    Alcohol/week: 3.0 standard drinks    Types: 3 Standard drinks or equivalent per week    Comment: once a week when he goes out to dinner-socially  . Drug use: No    Allergies as of 05/02/2020 - Review Complete 05/02/2020  Allergen Reaction Noted  . Penicillins Itching and Swelling 10/22/2015    Review of Systems:    All systems reviewed and negative except where noted in HPI.   Physical Exam:  Vital signs in last 24 hours: Temp:  [97.9 F (36.6 C)-98.4 F (36.9 C)] 98.2 F (36.8 C) (06/08 1152) Pulse Rate:  [59-60] 59 (06/08 1152) Resp:  [15-18] 15 (06/08 1152) BP: (141-167)/(73-79) 161/74 (06/08 1152) SpO2:  [92 %-94 %] 94 % (06/08 1152) Last BM Date: 05/06/20 General:   Pleasant, cooperative in NAD, sitting in chair Head:  Normocephalic and atraumatic. Eyes:   No icterus.   Conjunctiva pink. PERRLA. Ears:  Normal  auditory acuity. Neck:  Supple; no masses or thyroidomegaly Lungs: Respirations even and unlabored. Lungs clear to auscultation bilaterally.   No wheezes, crackles, or rhonchi.  Heart:  Regular rate and rhythm;  Without murmur, clicks, rubs or gallops Abdomen:  Soft, nondistended, nontender. Normal bowel sounds. No appreciable masses or hepatomegaly.  No rebound or guarding.  Drain in the right upper quadrant Rectal:  Not performed. Msk:  Symmetrical without gross deformities.  Strength generalized weakness Extremities:  Without edema, cyanosis or clubbing. Neurologic:  Alert and oriented x3;  grossly normal neurologically. Skin:  Intact without significant lesions or rashes. Psych:  Alert and cooperative. Normal affect.  LAB RESULTS: CBC Latest Ref Rng & Units 05/08/2020 05/07/2020 05/06/2020  WBC 4.0 - 10.5 K/uL 11.7(H) 12.2(H) 13.6(H)  Hemoglobin 13.0 - 17.0 g/dL 14.6 14.1 15.8  Hematocrit 39.0 - 52.0 % 41.5 39.3 44.7  Platelets 150 - 400 K/uL 205 206 226    BMET BMP Latest Ref Rng & Units 05/08/2020 05/07/2020 05/06/2020  Glucose 70 - 99 mg/dL 79 88 94  BUN 8 - 23 mg/dL 33(H) 37(H) 37(H)  Creatinine 0.61 - 1.24 mg/dL 1.80(H) 1.98(H) 2.19(H)  BUN/Creat Ratio 6 - 22 (calc) - - -  Sodium 135 - 145 mmol/L 140 137 139  Potassium 3.5 - 5.1 mmol/L 3.2(L) 2.9(L) 3.0(L)  Chloride 98 - 111 mmol/L 107 105 104  CO2 22 - 32 mmol/L 21(L) 23 22  Calcium 8.9 - 10.3 mg/dL 8.1(L) 8.0(L) 8.6(L)    LFT Hepatic Function Latest Ref Rng & Units 05/08/2020 05/07/2020 05/06/2020  Total Protein 6.5 - 8.1 g/dL 5.1(L) 4.8(L) 5.9(L)  Albumin 3.5 - 5.0 g/dL 2.5(L) 2.2(L) 2.7(L)  AST 15 - 41 U/L 31 31 59(H)  ALT 0 - 44 U/L 83(H) 110(H) 183(H)  Alk Phosphatase 38 - 126 U/L 126 137(H) 184(H)  Total Bilirubin 0.3 - 1.2 mg/dL 2.9(H) 2.3(H) 2.6(H)  Bilirubin, Direct 0.0 - 0.2 mg/dL - - -  STUDIES: DG ABD ACUTE 2+V W 1V CHEST  Result Date: 05/08/2020 CLINICAL DATA:  Ileus, NG tube placement EXAM: DG ABDOMEN ACUTE W/  1V CHEST COMPARISON:  05/06/2020 FINDINGS: NG tube is in the stomach. Oral contrast material seen within the colon. No evidence of bowel dilatation. Right upper quadrant drainage catheter in place, likely cholecystostomy tube. No organomegaly or free air. Mild elevation of the right hemidiaphragm. Right base atelectasis. Left lung clear. Heart is normal size. Left pacer in place. IMPRESSION: NG tube tip in the stomach.  No evidence of obstruction or ileus. Mild elevation of the right hemidiaphragm with right base atelectasis. Electronically Signed   By: Rolm Baptise M.D.   On: 05/08/2020 11:43   DG Abd Portable 1V  Result Date: 05/06/2020 CLINICAL DATA:  Status post nasogastric tube placement. EXAM: PORTABLE ABDOMEN - 1 VIEW COMPARISON:  May 06, 2020 (8:50 p.m.) FINDINGS: A nasogastric tube is seen with its distal tip overlying the body of the stomach. A moderate amount of air is seen within the gastric lumen. The bowel gas pattern is otherwise normal. Radiopaque contrast is seen throughout the large bowel. A percutaneous cholecystostomy tube is again seen overlying the lateral aspect of the right upper quadrant. No radio-opaque calculi or other significant radiographic abnormality are seen. IMPRESSION: Nasogastric tube positioning, as described above. Electronically Signed   By: Virgina Norfolk M.D.   On: 05/06/2020 22:28   DG Abd Portable 1V  Result Date: 05/06/2020 CLINICAL DATA:  Enteric catheter placement EXAM: PORTABLE ABDOMEN - 1 VIEW COMPARISON:  05/06/2020 at 10:51 a.m. FINDINGS: Frontal view of the lower chest and upper abdomen demonstrates dual lead pacemaker unchanged. Enteric catheter tip and side port project over the gastric fundus. Bowel gas pattern is unremarkable. Percutaneous cholecystostomy tube right upper quadrant. IMPRESSION: 1. Enteric catheter projecting over gastric fundus. 2. Stable percutaneous cholecystostomy tube. Electronically Signed   By: Randa Ngo M.D.   On: 05/06/2020  22:20      Impression / Plan:   Jose Castro is a 86 y.o. male with History of A. fib on Eliquis, history of stroke, CKD who was admitted on 6/2 secondary to acute cholecystitis s/p IR guided biliary drain placement, hospital course complicated by gastric distention, nausea and vomiting, NG tube placement for decompression.  GI is consulted to evaluate for choledocholithiasis due to rising total bilirubin.  Overall patient's LFTs have significantly improved since biliary drain placement.  Initial imaging on admission including right upper quadrant ultrasound and CT abdomen pelvis did not reveal evidence of choledocholithiasis or biliary ductal dilation MRCP could not be performed at ERCP due to presence of pacemaker.  For this patient need to be transferred to [facility] Next best alternative is perform cholangiogram through the biliary drain, therefore consult IR has been placed to evaluate for any biliary stricture or choledocholithiasis.  If this is positive, ERCP can be performed  Thank you for involving me in the care of this patient.      LOS: 6 days   Sherri Sear, MD  05/08/2020, 1:41 PM   Note: This dictation was prepared with Dragon dictation along with smaller phrase technology. Any transcriptional errors that result from this process are unintentional.

## 2020-05-09 DIAGNOSIS — K810 Acute cholecystitis: Secondary | ICD-10-CM

## 2020-05-09 LAB — COMPREHENSIVE METABOLIC PANEL
ALT: 54 U/L — ABNORMAL HIGH (ref 0–44)
AST: 24 U/L (ref 15–41)
Albumin: 2.3 g/dL — ABNORMAL LOW (ref 3.5–5.0)
Alkaline Phosphatase: 103 U/L (ref 38–126)
Anion gap: 8 (ref 5–15)
BUN: 30 mg/dL — ABNORMAL HIGH (ref 8–23)
CO2: 25 mmol/L (ref 22–32)
Calcium: 7.7 mg/dL — ABNORMAL LOW (ref 8.9–10.3)
Chloride: 106 mmol/L (ref 98–111)
Creatinine, Ser: 1.43 mg/dL — ABNORMAL HIGH (ref 0.61–1.24)
GFR calc Af Amer: 49 mL/min — ABNORMAL LOW (ref >60)
GFR calc non Af Amer: 43 mL/min — ABNORMAL LOW (ref >60)
Glucose, Bld: 141 mg/dL — ABNORMAL HIGH (ref 70–99)
Potassium: 3.3 mmol/L — ABNORMAL LOW (ref 3.5–5.1)
Sodium: 139 mmol/L (ref 135–145)
Total Bilirubin: 2.4 mg/dL — ABNORMAL HIGH (ref 0.3–1.2)
Total Protein: 4.7 g/dL — ABNORMAL LOW (ref 6.5–8.1)

## 2020-05-09 LAB — CBC
HCT: 38.6 % — ABNORMAL LOW (ref 39.0–52.0)
Hemoglobin: 13.3 g/dL (ref 13.0–17.0)
MCH: 31 pg (ref 26.0–34.0)
MCHC: 34.5 g/dL (ref 30.0–36.0)
MCV: 90 fL (ref 80.0–100.0)
Platelets: 210 10*3/uL (ref 150–400)
RBC: 4.29 MIL/uL (ref 4.22–5.81)
RDW: 14.4 % (ref 11.5–15.5)
WBC: 10.2 10*3/uL (ref 4.0–10.5)
nRBC: 0 % (ref 0.0–0.2)

## 2020-05-09 LAB — MAGNESIUM: Magnesium: 1.7 mg/dL (ref 1.7–2.4)

## 2020-05-09 MED ORDER — ENOXAPARIN SODIUM 40 MG/0.4ML ~~LOC~~ SOLN
40.0000 mg | SUBCUTANEOUS | Status: DC
  Administered 2020-05-09 – 2020-05-12 (×4): 40 mg via SUBCUTANEOUS
  Filled 2020-05-09 (×4): qty 0.4

## 2020-05-09 MED ORDER — VANCOMYCIN HCL IN DEXTROSE 1-5 GM/200ML-% IV SOLN
1000.0000 mg | INTRAVENOUS | Status: DC
  Administered 2020-05-09 – 2020-05-11 (×3): 1000 mg via INTRAVENOUS
  Filled 2020-05-09 (×3): qty 200

## 2020-05-09 NOTE — Progress Notes (Signed)
Jose Darby, MD [address]  [place], [place] [address]  Main: [phone]  Fax: [phone] Pager: [phone]   Subjective: Patient had good bowel movements today.  He is tolerating full liquids.  He denies any abdominal pain, nausea or vomiting.  He is in good spirits today.  His daughter is bedside.   Objective: Vital signs in last 24 hours: Vitals:   05/08/20 2147 05/09/20 0503 05/09/20 1037 05/09/20 1212  BP: (!) 149/69 (!) 141/67 117/72 131/74  Pulse: 60 60 (!) 59 60  Resp: 20 16  17   Temp: 98.3 F (36.8 C) 97.6 F (36.4 C)  97.9 F (36.6 C)  TempSrc: Oral Oral  Oral  SpO2: 97% 92% 97% 97%  Weight:      Height:       Weight change:   Intake/Output Summary (Last 24 hours) at 05/09/2020 1509 Last data filed at 05/09/2020 1429 Gross per 24 hour  Intake 2102.65 ml  Output 1400 ml  Net 702.65 ml     Exam: Heart:: Regular rate and rhythm, S1S2 present or without murmur or extra heart sounds Lungs: normal and clear to auscultation Abdomen: soft, nontender, normal bowel sounds   Lab Results: CBC Latest Ref Rng & Units 05/09/2020 05/08/2020 05/07/2020  WBC 4.0 - 10.5 K/uL 10.2 11.7(H) 12.2(H)  Hemoglobin 13.0 - 17.0 g/dL 13.3 14.6 14.1  Hematocrit 39.0 - 52.0 % 38.6(L) 41.5 39.3  Platelets 150 - 400 K/uL 210 205 206   CMP Latest Ref Rng & Units 05/09/2020 05/08/2020 05/07/2020  Glucose 70 - 99 mg/dL 141(H) 79 88  BUN 8 - 23 mg/dL 30(H) 33(H) 37(H)  Creatinine 0.61 - 1.24 mg/dL 1.43(H) 1.80(H) 1.98(H)  Sodium 135 - 145 mmol/L 139 140 137  Potassium 3.5 - 5.1 mmol/L 3.3(L) 3.2(L) 2.9(L)  Chloride 98 - 111 mmol/L 106 107 105  CO2 22 - 32 mmol/L 25 21(L) 23  Calcium 8.9 - 10.3 mg/dL 7.7(L) 8.1(L) 8.0(L)  Total Protein 6.5 - 8.1 g/dL 4.7(L) 5.1(L) 4.8(L)  Total Bilirubin 0.3 - 1.2 mg/dL 2.4(H) 2.9(H) 2.3(H)  Alkaline Phos 38 - 126 U/L 103 126 137(H)  AST 15 - 41 U/L 24 31 31   ALT 0 - 44 U/L 54(H) 83(H) 110(H)    Micro Results: Recent Results  (from the past 240 hour(s))  SARS Coronavirus 2 by RT PCR (hospital order, performed in [facility] hospital lab) Nasopharyngeal Nasopharyngeal Swab     Status: None   Collection Time: 05/03/20 10:25 AM   Specimen: Nasopharyngeal Swab  Result Value Ref Range Status   SARS Coronavirus 2 NEGATIVE NEGATIVE Final    Comment: (NOTE) SARS-CoV-2 target nucleic acids are NOT DETECTED. The SARS-CoV-2 RNA is generally detectable in upper and lower respiratory specimens during the acute phase of infection. The lowest concentration of SARS-CoV-2 viral copies this assay can detect is 250 copies / mL. A negative result does not preclude SARS-CoV-2 infection and should not be used as the sole basis for treatment or other patient management decisions.  A negative result may occur with improper specimen collection / handling, submission of specimen other than nasopharyngeal swab, presence of viral mutation(s) within the areas targeted by this assay, and inadequate number of viral copies (<250 copies / mL). A negative result must be combined with clinical observations, patient history, and epidemiological information. Fact Sheet for Patients:   StrictlyIdeas.no Fact Sheet for Healthcare Providers: BankingDealers.co.za This test is not yet approved or cleared  by the [place]  States FDA and has been authorized for detection and/or diagnosis of SARS-CoV-2 by FDA under an Emergency Use Authorization (EUA).  This EUA will remain in effect (meaning this test can be used) for the duration of the COVID-19 declaration under Section 564(b)(1) of the Act, 21 U.S.C. section 360bbb-3(b)(1), unless the authorization is terminated or revoked sooner. Performed at [facility], [address] [place], [place] [address]   Aerobic/Anaerobic Culture (surgical/deep wound)     Status: None   Collection Time: 05/03/20 12:51 PM   Specimen: Gallbladder; Bile  Result  Value Ref Range Status   Specimen Description   Final    GALL BLADDER Performed at [facility], [address], [place], [place] [address]    Special Requests   Final    Normal Performed at [facility], [place], [place] [address]    Gram Stain   Final    RARE WBC PRESENT,BOTH PMN AND MONONUCLEAR FEW GRAM POSITIVE COCCI IN CHAINS    Culture   Final    ABUNDANT ENTEROCOCCUS FAECIUM ABUNDANT ENTEROCOCCUS FAECALIS NO ANAEROBES ISOLATED Performed at [facility], [place] [address], [place], [place] [address]    Report Status 05/08/2020 FINAL  Final   Organism ID, Bacteria ENTEROCOCCUS FAECIUM  Final   Organism ID, Bacteria ENTEROCOCCUS FAECALIS  Final      Susceptibility   Enterococcus faecalis - MIC*    AMPICILLIN <=2 SENSITIVE Sensitive     VANCOMYCIN 2 SENSITIVE Sensitive     GENTAMICIN SYNERGY SENSITIVE Sensitive     * ABUNDANT ENTEROCOCCUS FAECALIS   Enterococcus faecium - MIC*    AMPICILLIN <=2 SENSITIVE Sensitive     VANCOMYCIN 1 SENSITIVE Sensitive     GENTAMICIN SYNERGY SENSITIVE Sensitive     * ABUNDANT ENTEROCOCCUS FAECIUM  Culture, blood (routine x 2)     Status: None (Preliminary result)   Collection Time: 05/08/20  6:42 PM   Specimen: BLOOD  Result Value Ref Range Status   Specimen Description BLOOD LEFT ANTECUBITAL  Final   Special Requests   Final    BOTTLES DRAWN AEROBIC AND ANAEROBIC Blood Culture adequate volume   Culture   Final    NO GROWTH < 12 HOURS Performed at [facility], [address] [place], [place] [address]    Report Status PENDING  Incomplete  Culture, blood (routine x 2)     Status: None (Preliminary result)   Collection Time: 05/08/20  6:42 PM   Specimen: BLOOD  Result Value Ref Range Status   Specimen Description BLOOD BLOOD LEFT HAND  Final   Special Requests   Final    BOTTLES DRAWN AEROBIC ONLY Blood Culture results may not be optimal due to an inadequate volume of blood received  in culture bottles   Culture   Final    NO GROWTH < 12 HOURS Performed at [facility], [address] [place], [place] [address]    Report Status PENDING  Incomplete   Studies/Results: DG ABD ACUTE 2+V W 1V CHEST  Result Date: 05/08/2020 CLINICAL DATA:  Ileus, NG tube placement EXAM: DG ABDOMEN ACUTE W/ 1V CHEST COMPARISON:  05/06/2020 FINDINGS: NG tube is in the stomach. Oral contrast material seen within the colon. No evidence of bowel dilatation. Right upper quadrant drainage catheter in place, likely cholecystostomy tube. No organomegaly or free air. Mild elevation of the right hemidiaphragm. Right base atelectasis. Left lung clear. Heart is normal size. Left pacer in place. IMPRESSION:  NG tube tip in the stomach.  No evidence of obstruction or ileus. Mild elevation of the right hemidiaphragm with right base atelectasis. Electronically Signed   By: Rolm Baptise M.D.   On: 05/08/2020 11:43   DG CHOLANGIOGRAM  EXISTING TUBE  Result Date: 05/08/2020 INDICATION: History of acute cholecystitis, post image guided placement of cholecystostomy tube 05/03/2020, subsequently exchanged on 05/04/2020, due to device malfunction. Given persistently elevated LFTs, request made for performance of a percutaneous cholangiogram via the existing cholecystostomy tube to evaluate for the presence of choledocholithiasis. EXAM: FLUOROSCOPIC GUIDED CHOLECYSTOSTOMY TUBE INJECTION COMPARISON:  CT abdomen pelvis-05/02/2020; CT-guided cholecystostomy tube placement-05/03/2020; fluoroscopic guided cholecystostomy tube exchange-05/04/2020 MEDICATIONS: None ANESTHESIA/SEDATION: None CONTRAST:  25 cc Omnipaque 300-administered into the gallbladder lumen via the existing cholecystostomy tube. FLUOROSCOPY TIME:  1 minutes, 36 seconds COMPLICATIONS: None immediate. PROCEDURE: The patient was positioned supine on the fluoroscopy table. A preprocedural spot fluoroscopic image was obtained of the right upper abdominal quadrant  existing cholecystostomy tube. Multiple spot fluoroscopic radiographic images were obtained of the right upper abdominal quadrant and existing cholecystostomy tube following injection of a small amount of contrast. Images were reviewed and discussed with the patient. The cholecystostomy tube was flushed with a small amount of saline and reconnected to a gravity bag. A dressing was placed. The patient tolerated the procedure well without immediate postprocedural complication. FINDINGS: Preprocedural spot fluoroscopic image of the right upper abdominal quadrant demonstrates grossly unchanged positioning of the cholecystostomy tube with end coiled and locked overlying the expected location of the gallbladder fundus. Subsequent contrast injection demonstrates appropriate functionality of the cholecystostomy tube with brisk opacification of the gallbladder. There are no discrete persistent filling defects within the gallbladder lumen to suggest the presence of cholelithiasis. There is passage of contrast from the gallbladder through the cystic and common bile ducts, ultimately with faint opacification of the duodenum. There is a persistent nonocclusive filling defect within the distal aspect of the CBD worrisome for nonocclusive choledocholithiasis IMPRESSION: 1. Appropriately positioned and functioning cholecystostomy tube. No exchange performed. 2. Nonocclusive filling defect within the distal aspect of the CBD worrisome for nonocclusive choledocholithiasis. PLAN: - Further evaluation and management with ERCP could be performed as clinically indicated. - Alternatively, could consider repeat cholangiogram (with potential cholecystostomy tube capping trial) in 1 month to evaluate for spontaneous passage of the tiny suspected choledocholithiasis. Electronically Signed   By: Sandi Mariscal M.D.   On: 05/08/2020 16:35   Medications:  I have reviewed the patient's current medications. Prior to Admission:  Medications Prior  to Admission  Medication Sig Dispense Refill Last Dose  . acetaminophen (TYLENOL) 325 MG tablet Take 2 tablets (650 mg total) by mouth every 4 (four) hours as needed for mild pain (or temp > 37.5 C (99.5 F)). (Patient taking differently: Take 650 mg by mouth as needed for mild pain (or temp > 37.5 C (99.5 F)). )     . apixaban (ELIQUIS) 2.5 MG TABS tablet Take 1 tablet (2.5 mg total) by mouth 2 (two) times daily. 60 tablet 0   . Cholecalciferol (VITAMIN D3) 50 MCG (2000 UT) TABS Take 2,000 Units by mouth daily. 30 tablet 0   . finasteride (PROSCAR) 5 MG tablet Take 1 tablet (5 mg total) by mouth daily. 30 tablet 0   . metoprolol succinate (TOPROL XL) 25 MG 24 hr tablet Take 1 tablet (25 mg total) by mouth daily. 30 tablet 0   . Omega-3 Fatty Acids (FISH OIL) 1000 MG CAPS Take 1,000 mg  by mouth daily.      . ondansetron (ZOFRAN) 4 MG tablet Take 1 tablet (4 mg total) by mouth every 8 (eight) hours as needed for nausea or vomiting. 20 tablet 0   . pantoprazole (PROTONIX) 40 MG tablet Take 1 tablet (40 mg total) by mouth daily. 90 tablet 1   . rosuvastatin (CRESTOR) 40 MG tablet Take 1 tablet (40 mg total) by mouth daily at 6 PM. 30 tablet 0    Scheduled: . cholecalciferol  2,000 Units Oral Daily  . enoxaparin (LOVENOX) injection  40 mg Subcutaneous Q24H  . feeding supplement  1 Container Oral TID BM  . finasteride  5 mg Oral Daily  . metoprolol succinate  25 mg Oral Daily  . pantoprazole (PROTONIX) IV  40 mg Intravenous Q24H  . sodium chloride flush  5 mL Intracatheter Q8H   Continuous: . lactated ringers 75 mL/hr at 05/09/20 0143  . metronidazole 500 mg (05/09/20 1041)  . vancomycin     VPX:TGGYIRS, ondansetron **OR** ondansetron (ZOFRAN) IV, promethazine, traZODone Anti-infectives (From admission, onward)   Start     Dose/Rate Route Frequency Ordered Stop   05/09/20 1600  vancomycin (VANCOCIN) IVPB 1000 mg/200 mL premix     1,000 mg 200 mL/hr over 60 Minutes Intravenous Every 24 hours  05/09/20 1205     05/06/20 1800  vancomycin (VANCOREADY) IVPB 750 mg/150 mL  Status:  Discontinued     750 mg 150 mL/hr over 60 Minutes Intravenous Every 24 hours 05/05/20 1609 05/09/20 1205   05/05/20 1700  vancomycin (VANCOREADY) IVPB 1500 mg/300 mL     1,500 mg 150 mL/hr over 120 Minutes Intravenous  Once 05/05/20 1607 05/05/20 2341   05/03/20 2200  ceFEPIme (MAXIPIME) 2 g in sodium chloride 0.9 % 100 mL IVPB  Status:  Discontinued     2 g 200 mL/hr over 30 Minutes Intravenous Every 24 hours 05/03/20 0818 05/05/20 1613   05/03/20 1000  metroNIDAZOLE (FLAGYL) IVPB 500 mg     500 mg 100 mL/hr over 60 Minutes Intravenous Every 8 hours 05/03/20 0818     05/02/20 2200  ceFEPIme (MAXIPIME) 2 g in sodium chloride 0.9 % 100 mL IVPB  Status:  Discontinued     2 g 200 mL/hr over 30 Minutes Intravenous Every 12 hours 05/02/20 1934 05/03/20 0754   05/02/20 2000  ciprofloxacin (CIPRO) IVPB 400 mg  Status:  Discontinued     400 mg 200 mL/hr over 60 Minutes Intravenous Every 12 hours 05/02/20 1950 05/02/20 2003     Scheduled Meds: . cholecalciferol  2,000 Units Oral Daily  . enoxaparin (LOVENOX) injection  40 mg Subcutaneous Q24H  . feeding supplement  1 Container Oral TID BM  . finasteride  5 mg Oral Daily  . metoprolol succinate  25 mg Oral Daily  . pantoprazole (PROTONIX) IV  40 mg Intravenous Q24H  . sodium chloride flush  5 mL Intracatheter Q8H   Continuous Infusions: . lactated ringers 75 mL/hr at 05/09/20 0143  . metronidazole 500 mg (05/09/20 1041)  . vancomycin     PRN Meds:.iohexol, ondansetron **OR** ondansetron (ZOFRAN) IV, promethazine, traZODone   Assessment: Active Problems:   Acute cholecystitis  Jose Castro is a 86 y.o. male with History of A. fib on Eliquis, history of stroke, CKD who was admitted on 6/2 secondary to acute cholecystitis s/p IR guided biliary drain placement, hospital course complicated by gastric distention, nausea and vomiting, NG tube placement for  decompression.  GI is consulted  to evaluate for choledocholithiasis due to rising total bilirubin.  Overall patient's LFTs have significantly improved since biliary drain placement.  Initial imaging on admission including right upper quadrant ultrasound and CT abdomen pelvis did not reveal evidence of choledocholithiasis or biliary ductal dilation IR cholangiogram revealed nonocclusive choledocholithiasis in distal CBD MRCP could not be performed due to presence of AICD/pacemaker LFTs are downtrending  Plan: Monitor LFTs daily Advance diet to mechanical soft, low calorie Tentative plan to perform ERCP by Dr. Allen Norris on Friday Continue antibiotics per general surgery Blood cultures in process   LOS: 7 days   Jose Castro 05/09/2020, 3:09 PM

## 2020-05-09 NOTE — TOC Initial Note (Addendum)
Transition of Care [facility]) - Initial/Assessment Note    Patient Details  Name: Jose Castro MRN: [id number] Date of Birth: 1926-05-30  Transition of Care [facility] CM/SW Contact:    Candie Chroman, LCSW Phone Number: 05/09/2020, 10:11 AM  Clinical Narrative:  CSW met with patient. Daughter at bedside. CSW introduced role and explained that PT recommendations would be discussed. Patient and his daughter agreeable to HHPT. [facility] is first preference since patient worked with them last year. [facility] representative will review referral and follow up with decision. Provided CMS scores for other agencies that serve their zip code. CSW made them aware of frequent difficulty setting patients up with home health who have [facility] Medicare. If unable to find an agency to take him, they are agreeable to outpatient PT here at the hospital. Patient has a rollator, two-wheeled walker, elevated toilet seat and shower seat at home. No other DME recommendations. No further concerns. CSW encouraged patient and his daughter to contact CSW as needed. CSW will continue to follow patient and his daughter for support and facilitate discharge home when medically stable.               11:45 am: [facility], [facility], and [facility] are unable to accept referral. [facility] and [facility] are checking to see if they can accept. Left message for [facility], and [facility].  2:20 pm: [facility] and [facility] are unable to accept. [facility] can take her for PT and RN. They are aware of cholecystostomy tube. Patient and daughter are aware and agreeable. Told daughter that the home health RN would provide family with education on how to manage drain on days they cannot come to the home.  Expected Discharge Plan: [place] Barriers to Discharge: Continued Medical Work up   Patient Goals and CMS Choice   CMS Medicare.gov Compare Post Acute Care list provided to:: Patient(Daughter at bedside.)     Expected Discharge Plan and Services Expected Discharge Plan: [place] Choice: [place] arrangements for the past 2 months: Single Family Home                                      Prior Living Arrangements/Services Living arrangements for the past 2 months: Single Family Home Lives with:: Self(Family with him 24/7) Patient language and need for interpreter reviewed:: 86 Do you feel safe going back to the place where you live?: Yes      Need for Family Participation in Patient Care: Yes (Comment) Care giver support system in place?: Yes (comment) Current home services: DME Criminal Activity/Legal Involvement Pertinent to Current Situation/Hospitalization: No - Comment as needed  Activities of Daily Living Home Assistive Devices/Equipment: Blood pressure cuff, Hearing aid ADL Screening (condition at time of admission) Patient's cognitive ability adequate to safely complete daily activities?: Yes Is the patient deaf or have difficulty hearing?: Yes Does the patient have difficulty seeing, even when wearing glasses/contacts?: No Does the patient have difficulty concentrating, remembering, or making decisions?: No Patient able to express need for assistance with ADLs?: Yes Does the patient have difficulty dressing or bathing?: No Independently performs ADLs?: No Communication: Independent Dressing (OT): Needs assistance Is this a change from baseline?: Pre-admission baseline Grooming: Appropriate for developmental age Feeding: Independent Bathing: Appropriate for developmental age 31: Needs assistance Is this a change  from baseline?: Pre-admission baseline In/Out Bed: Independent with device (comment) Walks in Home: Independent Does the patient have difficulty walking or climbing stairs?: Yes Weakness of Legs: None Weakness of Arms/Hands: None  Permission Sought/Granted Permission sought to share information  with : Facility Sport and exercise psychologist, Family Supports Permission granted to share information with : Yes, Verbal Permission Granted     Permission granted to share info w AGENCY: [place] granted to share info w Relationship: Daughter     Emotional Assessment Appearance:: Appears stated age Attitude/Demeanor/Rapport: Engaged, Gracious Affect (typically observed): Accepting, Appropriate, Calm, Pleasant Orientation: : Oriented to Self, Oriented to Place, Oriented to  Time, Oriented to Situation Alcohol / Substance Use: Not Applicable Psych Involvement: No (comment)  Admission diagnosis:  Acute cholecystitis [K81.0] Patient Active Problem List   Diagnosis Date Noted  . Acute cholecystitis 05/02/2020  . Chronic venous insufficiency 12/05/2019  . 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 CKD (chronic kidney disease), stage II   . New onset atrial fibrillation ([place])   . 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 Goals of care, counseling/discussion   . Palliative care by specialist   . DNR (do not resuscitate) discussion   . History of CVA (cerebrovascular accident) 05/22/2019  . Persistent proteinuria 07/30/2018  . 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 Chronic kidney disease, stage III (moderate) 06/15/2018  . Leg pain 05/06/2018  . Lymphedema 05/06/2018  . Coagulopathy ([place]) 07/07/2017  . Lumbar spondylosis 03/10/2017  . Elevated uric acid in blood 11/20/2016  . BPH (benign prostatic hyperplasia) 05/29/2016  . Peripheral vascular disease of lower extremity ([place]) 03/03/2016  . Gallstone 12/31/2015  . 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 Atherosclerosis of aorta ([place]) 11/22/2015  . Carotid artery narrowing 11/22/2015  . Diverticulosis of colon 11/22/2015  . Decreased creatinine clearance 11/22/2015  . Arthritis, degenerative 11/22/2015  . Lactose intolerance 11/22/2015  . Basal cell  carcinoma 11/22/2015  . Essential hypertension 06/21/2015  . Hyperlipemia 06/21/2015  . GERD (gastroesophageal reflux disease) 06/21/2015  . Calculus of kidney 11/18/2013   PCP:  Steele Sizer, MD Pharmacy:   [place], [place] - [place] [address] Phone: [phone] Fax: [phone]     Social Determinants of Health (SDOH) Interventions    Readmission Risk Interventions No flowsheet data found.

## 2020-05-09 NOTE — Consult Note (Signed)
Pharmacy Antibiotic Note  Jose Castro is a 86 y.o. male admitted on 05/02/2020 with cholecystitis. Cx taking of Bile from Gallbladder grew abundant Enterococcus faecium and faecalis.   Pharmacy was consulted for vancomycin  dosing. The patient confirms rash w/ penicillins. This is day  # 7 of broad-spectrum antibiotics and day # 5 of vancomycin and metronidazole, renal function is improving to near baseline level  Plan:     1) adjust vancomycin dose to 1000 mg IV every 24 hours T 1/2: 20.3 h, Ke 0.034 h-1  Css (calculated): 31.7/14.4 mcg/mL  2) continue Flagyl 500 mg IV every 8 hours    Height: 5\' 11"  (180.3 cm) Weight: 78.4 kg (172 lb 13.5 oz) IBW/kg (Calculated) : 75.3  Temp (24hrs), Avg:98 F (36.7 C), Min:97.6 F (36.4 C), Max:98.3 F (36.8 C)  Recent Labs  Lab 05/05/20 0528 05/06/20 0710 05/07/20 0604 05/08/20 0441 05/09/20 0444  WBC 10.3 13.6* 12.2* 11.7* 10.2  CREATININE 2.19* 2.19* 1.98* 1.80* 1.43*    Estimated Creatinine Clearance: 35.8 mL/min (A) (by C-G formula based on SCr of 1.43 mg/dL (H)).    Allergies  Allergen Reactions  . Penicillins Itching and Swelling    Has patient had a PCN reaction causing immediate rash, facial/tongue/throat swelling, SOB or lightheadedness with hypotension: No Has patient had a PCN reaction causing severe rash involving mucus membranes or skin necrosis: No Has patient had a PCN reaction that required hospitalization: No Has patient had a PCN reaction occurring within the last 10 years: No If all of the above answers are "NO", then may proceed with Cephalosporin use.     Antimicrobials this admission: ciprofloxacin 6/2 x 1  cefepime 6/1 >> 6/5 Metronidazole 6/2 >> Vancomycin 6/5>>  Microbiology results: [place] from gallbladder collected 6/3: Enterococcus faecium and faecalis    Thank you for allowing pharmacy to be a part of this patient's care.  Dallie Piles, PharmD, BCPS Clinical Pharmacist 05/09/2020 9:19 AM

## 2020-05-09 NOTE — Progress Notes (Signed)
AVSS. Feeling well.  Ambulating frequently. Tolerated clear liquids after NG removal yesterday well.  Bile drainage down. Lungs: Clear. ABD: Soft, non-tender.  Labs: All trending appropriately. TB at 2.3, transaminases normal. Alk phos near normal.  WBC down, HGB at baseline.  Review GI notes and plans for ERCP on June 11.  Diet advance as tolerated.   May reculture bile drainage if this would change antibiotic management.  Will allow patient to recover from present illness then discuss possibility of cholecystectomy.

## 2020-05-09 NOTE — Plan of Care (Signed)
  Problem: Clinical Measurements: Goal: Ability to maintain clinical measurements within normal limits will improve Outcome: Progressing   Problem: Clinical Measurements: Goal: Will remain free from infection Outcome: Progressing   

## 2020-05-09 NOTE — Progress Notes (Signed)
PROGRESS NOTE    Jose Castro  WRU:[id number] DOB: 1926-11-01 DOA: 05/02/2020 PCP: Steele Sizer, MD   Brief Narrative:  Jose Castro  is a 86 y.o. Caucasian male with a known history of hypertension, dyslipidemia, BPH, CVA, GERD and osteoarthritis who was directly admitted for epigastric pain and nausea with dry heaves since last night and that was preceded by an ER visit for substernal chest pain yesterday when he was ruled out ACS and discharged.  Today he was seen by his primary care physician who discussed the case with Dr. Tollie Pizza for direct admission. CT abdomen with gallbladder distention and thickening with probable small layering of stones.  Findings were concerning for cholecystitis.  CMP with transaminitis and elevated T bili,  concerning for obstructive jaundice. GI was also consulted for ERCP but patient has to be off from Eliquis for more than 48 hours for procedure.  Cholecystostomy tube was placed by IR today.  Subjective: Pt tolerating diet well.  No fever, dyspnea, chest pain, abdominal pain, N/V/D.  Has increased swelling in arms and legs.    Assessment & Plan:   Active Problems:   Acute cholecystitis  Acute cholecystitis s/p cholecystostomy tube Patient afebrile with  improvement in leukocytosis.  Patient had cystostomy tube in place by IR it was changed 05/05/20 due to some leakage.  Improvement in his labs.  Tube draining yellow-colored fluid. Surgery is following Bile cultures with Enterococcus faecalis and Enterococcus faecium. --IR cholangiogram on 6/8, which showed "Nonocclusive filling defect within the distal aspect of the CBD worrisome for nonocclusive choledocholithiasis." PLAN: -continue vanc/flagyl per sensitivity  --Tentative plan to perform ERCP on Friday by Dr. Allen Norris --advance diet as tolerated --f/u bili  Nausea and vomiting 2/2 ileus, resolved Abdominal x-ray with normal bowel and distended stomach.  NG tube was placed by surgery with good  output. PLAN: -pt passed NG tube clamping trial, NG tube removed --advance diet as tolerated --d/c MIVF  Electrolyte abnormalities.  Patient develops recurrent hypokalemia and hypomagnesemia. -Replete electrolytes as needed.  AKI, improved Patient appears dry.  Creatinine started trending down, 1.9 peak. baseline around 1.3.  Bladder scan with about 200 cc but that was not post voidal.  Renal ultrasound without any hydronephrosis.  Did receive some contrast for CT abdomen on the day of admission. -d/c IV hydration.  Chronic atrial fibrillation, status post pacemaker placement. -We will continue Toprol-XL. -continue to hold Eliquis for upcoming ERCP    Dyslipidemia with significantly elevated LFTs likely related to acute hepatitis. -Holding Crestor for now as well as fish oil, given elevated LFTs.   BPH. -continue finasteride    History of gout. No acute flareup.   Objective: Vitals:   05/08/20 2147 05/09/20 0503 05/09/20 1037 05/09/20 1212  BP: (!) 149/69 (!) 141/67 117/72 131/74  Pulse: 60 60 (!) 59 60  Resp: 20 16  17   Temp: 98.3 F (36.8 C) 97.6 F (36.4 C)  97.9 F (36.6 C)  TempSrc: Oral Oral  Oral  SpO2: 97% 92% 97% 97%  Weight:      Height:        Intake/Output Summary (Last 24 hours) at 05/09/2020 1712 Last data filed at 05/09/2020 1429 Gross per 24 hour  Intake 2102.65 ml  Output 1400 ml  Net 702.65 ml   Filed Weights   05/03/20 0218  Weight: 78.4 kg    Examination:  Constitutional: NAD, alert HEENT: conjunctivae and lids normal, EOMI, hard of hearing CV: RRR no M,R,G. Distal pulses +2.  No cyanosis.   RESP: CTA B/L, normal respiratory effort  GI: +BS, NTND, chole tube draining clear brown fluid Extremities: Mild swelling in all extremities SKIN: warm, dry and intact Neuro: II - XII grossly intact.  Sensation intact    DVT prophylaxis: SCDs Code Status: Full Family Communication: Daughter updated at bedside today Disposition Plan:   Status is: Inpatient  Remains inpatient appropriate because:IV treatments appropriate due to intensity of illness or inability to take PO   Dispo: The patient is from: Home              Anticipated d/c is to: Home              Anticipated d/c date is: after 6/11              Patient currently is not medically stable to d/c.ERCP planned on 6/11 due to choledocholithiasis.     Consultants:   Surgery  IR  GI  Procedures:  IR guided Cholicystostomy tube placement.  Antimicrobials:  Flagyl Vancomycin  Data Reviewed: I have personally reviewed following labs and imaging studies  CBC: Recent Labs  Lab 05/04/20 0418 05/04/20 0418 05/05/20 0528 05/06/20 0710 05/07/20 0604 05/08/20 0441 05/09/20 0444  WBC 13.2*   < > 10.3 13.6* 12.2* 11.7* 10.2  NEUTROABS 11.4*  --   --  10.6*  --   --   --   HGB 13.2   < > 13.4 15.8 14.1 14.6 13.3  HCT 39.2   < > 38.6* 44.7 39.3 41.5 38.6*  MCV 91.0   < > 87.9 86.5 86.4 89.4 90.0  PLT 167   < > 183 226 206 205 210   < > = values in this interval not displayed.   Basic Metabolic Panel: Recent Labs  Lab 05/03/20 0555 05/04/20 0418 05/05/20 0528 05/06/20 0710 05/07/20 0604 05/08/20 0441 05/09/20 0444  NA 131*   < > 137 139 137 140 139  K 4.1   < > 4.0 3.0* 2.9* 3.2* 3.3*  CL 101   < > 109 104 105 107 106  CO2 24   < > 23 22 23  21* 25  GLUCOSE 125*   < > 112* 94 88 79 141*  BUN 32*   < > 35* 37* 37* 33* 30*  CREATININE 1.82*   < > 2.19* 2.19* 1.98* 1.80* 1.43*  CALCIUM 8.0*   < > 8.0* 8.6* 8.0* 8.1* 7.7*  MG 1.5*  --   --  1.9 1.7 2.0 1.7   < > = values in this interval not displayed.   GFR: Estimated Creatinine Clearance: 35.8 mL/min (A) (by C-G formula based on SCr of 1.43 mg/dL (H)). Liver Function Tests: Recent Labs  Lab 05/05/20 0528 05/06/20 0710 05/07/20 0604 05/08/20 0441 05/09/20 0444  AST 126* 59* 31 31 24   ALT 238* 183* 110* 83* 54*  ALKPHOS 186* 184* 137* 126 103  BILITOT 2.3* 2.6* 2.3* 2.9* 2.4*  PROT  5.0* 5.9* 4.8* 5.1* 4.7*  ALBUMIN 2.4* 2.7* 2.2* 2.5* 2.3*   No results for input(s): LIPASE, AMYLASE in the last 168 hours. No results for input(s): AMMONIA in the last 168 hours. Coagulation Profile: Recent Labs  Lab 05/02/20 1953  INR 2.0*   Cardiac Enzymes: No results for input(s): CKTOTAL, CKMB, CKMBINDEX, TROPONINI in the last 168 hours. BNP (last 3 results) No results for input(s): PROBNP in the last 8760 hours. HbA1C: No results for input(s): HGBA1C in the last 72 hours. CBG: No results  for input(s): GLUCAP in the last 168 hours. Lipid Profile: No results for input(s): CHOL, HDL, LDLCALC, TRIG, CHOLHDL, LDLDIRECT in the last 72 hours. Thyroid Function Tests: No results for input(s): TSH, T4TOTAL, FREET4, T3FREE, THYROIDAB in the last 72 hours. Anemia Panel: No results for input(s): VITAMINB12, FOLATE, FERRITIN, TIBC, IRON, RETICCTPCT in the last 72 hours. Sepsis Labs: No results for input(s): PROCALCITON, LATICACIDVEN in the last 168 hours.  Recent Results (from the past 240 hour(s))  SARS Coronavirus 2 by RT PCR (hospital order, performed in [facility] hospital lab) Nasopharyngeal Nasopharyngeal Swab     Status: None   Collection Time: 05/03/20 10:25 AM   Specimen: Nasopharyngeal Swab  Result Value Ref Range Status   SARS Coronavirus 2 NEGATIVE NEGATIVE Final    Comment: (NOTE) SARS-CoV-2 target nucleic acids are NOT DETECTED. The SARS-CoV-2 RNA is generally detectable in upper and lower respiratory specimens during the acute phase of infection. The lowest concentration of SARS-CoV-2 viral copies this assay can detect is 250 copies / mL. A negative result does not preclude SARS-CoV-2 infection and should not be used as the sole basis for treatment or other patient management decisions.  A negative result may occur with improper specimen collection / handling, submission of specimen other than nasopharyngeal swab, presence of viral mutation(s) within the areas  targeted by this assay, and inadequate number of viral copies (<250 copies / mL). A negative result must be combined with clinical observations, patient history, and epidemiological information. Fact Sheet for Patients:   StrictlyIdeas.no Fact Sheet for Healthcare Providers: BankingDealers.co.za This test is not yet approved or cleared  by the [place] FDA and has been authorized for detection and/or diagnosis of SARS-CoV-2 by FDA under an Emergency Use Authorization (EUA).  This EUA will remain in effect (meaning this test can be used) for the duration of the COVID-19 declaration under Section 564(b)(1) of the Act, 21 U.S.C. section 360bbb-3(b)(1), unless the authorization is terminated or revoked sooner. Performed at [facility], [address] [place], [place] [address]   Aerobic/Anaerobic Culture (surgical/deep wound)     Status: None   Collection Time: 05/03/20 12:51 PM   Specimen: Gallbladder; Bile  Result Value Ref Range Status   Specimen Description   Final    GALL BLADDER Performed at [facility], [address], [place], [place] [address]    Special Requests   Final    Normal Performed at [facility], [place], [place] [address]    Gram Stain   Final    RARE WBC PRESENT,BOTH PMN AND MONONUCLEAR FEW GRAM POSITIVE COCCI IN CHAINS    Culture   Final    ABUNDANT ENTEROCOCCUS FAECIUM ABUNDANT ENTEROCOCCUS FAECALIS NO ANAEROBES ISOLATED Performed at [facility], [place] [address], [place], [place] [address]    Report Status 05/08/2020 FINAL  Final   Organism ID, Bacteria ENTEROCOCCUS FAECIUM  Final   Organism ID, Bacteria ENTEROCOCCUS FAECALIS  Final      Susceptibility   Enterococcus faecalis - MIC*    AMPICILLIN <=2 SENSITIVE Sensitive     VANCOMYCIN 2 SENSITIVE Sensitive     GENTAMICIN SYNERGY SENSITIVE Sensitive     * ABUNDANT ENTEROCOCCUS FAECALIS    Enterococcus faecium - MIC*    AMPICILLIN <=2 SENSITIVE Sensitive     VANCOMYCIN 1 SENSITIVE Sensitive     GENTAMICIN SYNERGY SENSITIVE Sensitive     * ABUNDANT ENTEROCOCCUS FAECIUM  Culture, blood (routine x 2)  Status: None (Preliminary result)   Collection Time: 05/08/20  6:42 PM   Specimen: BLOOD  Result Value Ref Range Status   Specimen Description BLOOD LEFT ANTECUBITAL  Final   Special Requests   Final    BOTTLES DRAWN AEROBIC AND ANAEROBIC Blood Culture adequate volume   Culture   Final    NO GROWTH < 12 HOURS Performed at [facility], [address], [place], [place] [address]    Report Status PENDING  Incomplete  Culture, blood (routine x 2)     Status: None (Preliminary result)   Collection Time: 05/08/20  6:42 PM   Specimen: BLOOD  Result Value Ref Range Status   Specimen Description BLOOD BLOOD LEFT HAND  Final   Special Requests   Final    BOTTLES DRAWN AEROBIC ONLY Blood Culture results may not be optimal due to an inadequate volume of blood received in culture bottles   Culture   Final    NO GROWTH < 12 HOURS Performed at [facility], [address] [place], [place] [address]    Report Status PENDING  Incomplete     Radiology Studies: DG ABD ACUTE 2+V W 1V CHEST  Result Date: 05/08/2020 CLINICAL DATA:  Ileus, NG tube placement EXAM: DG ABDOMEN ACUTE W/ 1V CHEST COMPARISON:  05/06/2020 FINDINGS: NG tube is in the stomach. Oral contrast material seen within the colon. No evidence of bowel dilatation. Right upper quadrant drainage catheter in place, likely cholecystostomy tube. No organomegaly or free air. Mild elevation of the right hemidiaphragm. Right base atelectasis. Left lung clear. Heart is normal size. Left pacer in place. IMPRESSION: NG tube tip in the stomach.  No evidence of obstruction or ileus. Mild elevation of the right hemidiaphragm with right base atelectasis. Electronically Signed   By: Rolm Baptise M.D.   On: 05/08/2020 11:43    DG CHOLANGIOGRAM  EXISTING TUBE  Result Date: 05/08/2020 INDICATION: History of acute cholecystitis, post image guided placement of cholecystostomy tube 05/03/2020, subsequently exchanged on 05/04/2020, due to device malfunction. Given persistently elevated LFTs, request made for performance of a percutaneous cholangiogram via the existing cholecystostomy tube to evaluate for the presence of choledocholithiasis. EXAM: FLUOROSCOPIC GUIDED CHOLECYSTOSTOMY TUBE INJECTION COMPARISON:  CT abdomen pelvis-05/02/2020; CT-guided cholecystostomy tube placement-05/03/2020; fluoroscopic guided cholecystostomy tube exchange-05/04/2020 MEDICATIONS: None ANESTHESIA/SEDATION: None CONTRAST:  25 cc Omnipaque 300-administered into the gallbladder lumen via the existing cholecystostomy tube. FLUOROSCOPY TIME:  1 minutes, 36 seconds COMPLICATIONS: None immediate. PROCEDURE: The patient was positioned supine on the fluoroscopy table. A preprocedural spot fluoroscopic image was obtained of the right upper abdominal quadrant existing cholecystostomy tube. Multiple spot fluoroscopic radiographic images were obtained of the right upper abdominal quadrant and existing cholecystostomy tube following injection of a small amount of contrast. Images were reviewed and discussed with the patient. The cholecystostomy tube was flushed with a small amount of saline and reconnected to a gravity bag. A dressing was placed. The patient tolerated the procedure well without immediate postprocedural complication. FINDINGS: Preprocedural spot fluoroscopic image of the right upper abdominal quadrant demonstrates grossly unchanged positioning of the cholecystostomy tube with end coiled and locked overlying the expected location of the gallbladder fundus. Subsequent contrast injection demonstrates appropriate functionality of the cholecystostomy tube with brisk opacification of the gallbladder. There are no discrete persistent filling defects within the  gallbladder lumen to suggest the presence of cholelithiasis. There is passage of contrast from the gallbladder through the cystic and common bile ducts, ultimately with faint opacification of  the duodenum. There is a persistent nonocclusive filling defect within the distal aspect of the CBD worrisome for nonocclusive choledocholithiasis IMPRESSION: 1. Appropriately positioned and functioning cholecystostomy tube. No exchange performed. 2. Nonocclusive filling defect within the distal aspect of the CBD worrisome for nonocclusive choledocholithiasis. PLAN: - Further evaluation and management with ERCP could be performed as clinically indicated. - Alternatively, could consider repeat cholangiogram (with potential cholecystostomy tube capping trial) in 1 month to evaluate for spontaneous passage of the tiny suspected choledocholithiasis. Electronically Signed   By: Sandi Mariscal M.D.   On: 05/08/2020 16:35    Scheduled Meds: . cholecalciferol  2,000 Units Oral Daily  . enoxaparin (LOVENOX) injection  40 mg Subcutaneous Q24H  . feeding supplement  1 Container Oral TID BM  . finasteride  5 mg Oral Daily  . metoprolol succinate  25 mg Oral Daily  . pantoprazole (PROTONIX) IV  40 mg Intravenous Q24H  . sodium chloride flush  5 mL Intracatheter Q8H   Continuous Infusions: . lactated ringers 75 mL/hr at 05/09/20 0143  . metronidazole 500 mg (05/09/20 1041)  . vancomycin 1,000 mg (05/09/20 1609)     LOS: 7 days    Enzo Bi, MD [facility]  If 7PM-7AM, please contact night-coverage [email or web address]  05/09/2020, 5:12 PM

## 2020-05-10 LAB — COMPREHENSIVE METABOLIC PANEL
ALT: 42 U/L (ref 0–44)
AST: 18 U/L (ref 15–41)
Albumin: 2.2 g/dL — ABNORMAL LOW (ref 3.5–5.0)
Alkaline Phosphatase: 93 U/L (ref 38–126)
Anion gap: 7 (ref 5–15)
BUN: 19 mg/dL (ref 8–23)
CO2: 26 mmol/L (ref 22–32)
Calcium: 7.4 mg/dL — ABNORMAL LOW (ref 8.9–10.3)
Chloride: 106 mmol/L (ref 98–111)
Creatinine, Ser: 1.25 mg/dL — ABNORMAL HIGH (ref 0.61–1.24)
GFR calc Af Amer: 58 mL/min — ABNORMAL LOW (ref >60)
GFR calc non Af Amer: 50 mL/min — ABNORMAL LOW (ref >60)
Glucose, Bld: 115 mg/dL — ABNORMAL HIGH (ref 70–99)
Potassium: 2.4 mmol/L — CL (ref 3.5–5.1)
Sodium: 139 mmol/L (ref 135–145)
Total Bilirubin: 1.7 mg/dL — ABNORMAL HIGH (ref 0.3–1.2)
Total Protein: 4.7 g/dL — ABNORMAL LOW (ref 6.5–8.1)

## 2020-05-10 LAB — CBC
HCT: 38.9 % — ABNORMAL LOW (ref 39.0–52.0)
Hemoglobin: 13.5 g/dL (ref 13.0–17.0)
MCH: 31.1 pg (ref 26.0–34.0)
MCHC: 34.7 g/dL (ref 30.0–36.0)
MCV: 89.6 fL (ref 80.0–100.0)
Platelets: 231 10*3/uL (ref 150–400)
RBC: 4.34 MIL/uL (ref 4.22–5.81)
RDW: 14.2 % (ref 11.5–15.5)
WBC: 9.4 10*3/uL (ref 4.0–10.5)
nRBC: 0 % (ref 0.0–0.2)

## 2020-05-10 LAB — MAGNESIUM: Magnesium: 1.4 mg/dL — ABNORMAL LOW (ref 1.7–2.4)

## 2020-05-10 MED ORDER — SODIUM CHLORIDE 0.9 % IV SOLN
INTRAVENOUS | Status: DC | PRN
  Administered 2020-05-10 – 2020-05-12 (×2): 1000 mL via INTRAVENOUS

## 2020-05-10 MED ORDER — POTASSIUM CHLORIDE 10 MEQ/100ML IV SOLN
10.0000 meq | INTRAVENOUS | Status: AC
  Administered 2020-05-10 (×4): 10 meq via INTRAVENOUS
  Filled 2020-05-10 (×2): qty 100

## 2020-05-10 MED ORDER — POTASSIUM CHLORIDE CRYS ER 20 MEQ PO TBCR
40.0000 meq | EXTENDED_RELEASE_TABLET | Freq: Once | ORAL | Status: AC
  Administered 2020-05-10: 40 meq via ORAL
  Filled 2020-05-10: qty 2

## 2020-05-10 MED ORDER — MAGNESIUM SULFATE 2 GM/50ML IV SOLN
2.0000 g | Freq: Once | INTRAVENOUS | Status: AC
  Administered 2020-05-10: 2 g via INTRAVENOUS
  Filled 2020-05-10: qty 50

## 2020-05-10 NOTE — Progress Notes (Signed)
PROGRESS NOTE    Jose Castro  JIR:[id number] DOB: 07-16-1926 DOA: 05/02/2020 PCP: Steele Sizer, MD   Brief Narrative:  Jose Castro  is a 86 y.o. Caucasian male with a known history of hypertension, dyslipidemia, BPH, CVA, GERD and osteoarthritis who was directly admitted for epigastric pain and nausea with dry heaves since last night and that was preceded by an ER visit for substernal chest pain yesterday when he was ruled out ACS and discharged.  Today he was seen by his primary care physician who discussed the case with Dr. Tollie Pizza for direct admission. CT abdomen with gallbladder distention and thickening with probable small layering of stones.  Findings were concerning for cholecystitis.  CMP with transaminitis and elevated T bili,  concerning for obstructive jaundice. GI was also consulted for ERCP but patient has to be off from Eliquis for more than 48 hours for procedure.  Cholecystostomy tube was placed by IR today.  Subjective: Pt tolerating soft diet.  No more N/V.  Did report episodes of watery diarrhea.  No fever, dyspnea, chest pain, abdominal pain, dysuria.  Swelling improved.   Assessment & Plan:   Active Problems:   Acute cholecystitis  Acute cholecystitis s/p cholecystostomy tube Patient afebrile with  improvement in leukocytosis.  Patient had cystostomy tube in place by IR it was changed 05/05/20 due to some leakage.  Improvement in his labs.  Tube draining yellow-colored fluid. Surgery is following Bile cultures with Enterococcus faecalis and Enterococcus faecium. --IR cholangiogram on 6/8, which showed "Nonocclusive filling defect within the distal aspect of the CBD worrisome for nonocclusive choledocholithiasis." PLAN: --continue vanc/flagyl per sensitivity  --Tentative plan to perform ERCP on Friday by Dr. Allen Norris --advance diet as tolerated --f/u bili (trending down)  Nausea and vomiting 2/2 ileus, resolved Abdominal x-ray with normal bowel and distended stomach.   NG tube was placed by surgery with good output.  pt passed NG tube clamping trial, NG tube removed --MIVF d/c'ed PLAN: --advance diet as tolerated  Electrolyte abnormalities recurrent hypokalemia and hypomagnesemia -Replete electrolytes as needed.  AKI, improved Patient appears dry.  Creatinine started trending down, 1.9 peak. baseline around 1.3.  Bladder scan with about 200 cc but that was not post voidal.  Renal ultrasound without any hydronephrosis.  Did receive some contrast for CT abdomen on the day of admission. --Hold IV hydration and encourage oral hydration  Chronic atrial fibrillation, status post pacemaker placement. -continue Toprol-XL. -continue to hold Eliquis for upcoming ERCP    Dyslipidemia with significantly elevated LFTs likely related to acute hepatitis. -Holding Crestor for now as well as fish oil, given elevated LFTs.   BPH. -continue finasteride    History of gout. No acute flareup.   Objective: Vitals:   05/09/20 2100 05/10/20 0441 05/10/20 1030 05/10/20 1105  BP:  125/73 135/73 125/75  Pulse:  (!) 59 (!) 59 61  Resp: 15 16 16 17   Temp:  98.2 F (36.8 C) (!) 97.4 F (36.3 C) 98.3 F (36.8 C)  TempSrc:  Oral Oral Oral  SpO2:  94% 97% 95%  Weight:      Height:        Intake/Output Summary (Last 24 hours) at 05/10/2020 1650 Last data filed at 05/10/2020 1409 Gross per 24 hour  Intake 2872.59 ml  Output 975 ml  Net 1897.59 ml   Filed Weights   05/03/20 0218  Weight: 78.4 kg    Examination:  Constitutional: NAD, alert, oriented HEENT: conjunctivae and lids normal, EOMI, hard of hearing  CV: RRR no M,R,G. Distal pulses +2.  No cyanosis.   RESP: CTA B/L, normal respiratory effort  GI: +BS, NTND, chole tube draining clear brown fluid Extremities: No edema in BLE SKIN: warm, dry and intact Neuro: II - XII grossly intact.  Sensation intact    DVT prophylaxis: SCDs Code Status: Full Family Communication: Daughter updated at bedside  today Disposition Plan:  Status is: Inpatient  Remains inpatient appropriate because:IV treatments appropriate due to intensity of illness or inability to take PO   Dispo: The patient is from: Home              Anticipated d/c is to: Home              Anticipated d/c date is: after 6/11              Patient currently is not medically stable to d/c.ERCP planned on 6/11 due to choledocholithiasis.     Consultants:   Surgery  IR  GI  Procedures:  IR guided Cholicystostomy tube placement.  Antimicrobials:  Flagyl Vancomycin  Data Reviewed: I have personally reviewed following labs and imaging studies  CBC: Recent Labs  Lab 05/04/20 0418 05/05/20 0528 05/06/20 0710 05/07/20 0604 05/08/20 0441 05/09/20 0444 05/10/20 0429  WBC 13.2*   < > 13.6* 12.2* 11.7* 10.2 9.4  NEUTROABS 11.4*  --  10.6*  --   --   --   --   HGB 13.2   < > 15.8 14.1 14.6 13.3 13.5  HCT 39.2   < > 44.7 39.3 41.5 38.6* 38.9*  MCV 91.0   < > 86.5 86.4 89.4 90.0 89.6  PLT 167   < > 226 206 205 210 231   < > = values in this interval not displayed.   Basic Metabolic Panel: Recent Labs  Lab 05/06/20 0710 05/07/20 0604 05/08/20 0441 05/09/20 0444 05/10/20 0429  NA 139 137 140 139 139  K 3.0* 2.9* 3.2* 3.3* 2.4*  CL 104 105 107 106 106  CO2 22 23 21* 25 26  GLUCOSE 94 88 79 141* 115*  BUN 37* 37* 33* 30* 19  CREATININE 2.19* 1.98* 1.80* 1.43* 1.25*  CALCIUM 8.6* 8.0* 8.1* 7.7* 7.4*  MG 1.9 1.7 2.0 1.7 1.4*   GFR: Estimated Creatinine Clearance: 41 mL/min (A) (by C-G formula based on SCr of 1.25 mg/dL (H)). Liver Function Tests: Recent Labs  Lab 05/06/20 0710 05/07/20 0604 05/08/20 0441 05/09/20 0444 05/10/20 0429  AST 59* 31 31 24 18   ALT 183* 110* 83* 54* 42  ALKPHOS 184* 137* 126 103 93  BILITOT 2.6* 2.3* 2.9* 2.4* 1.7*  PROT 5.9* 4.8* 5.1* 4.7* 4.7*  ALBUMIN 2.7* 2.2* 2.5* 2.3* 2.2*   No results for input(s): LIPASE, AMYLASE in the last 168 hours. No results for input(s):  AMMONIA in the last 168 hours. Coagulation Profile: No results for input(s): INR, PROTIME in the last 168 hours. Cardiac Enzymes: No results for input(s): CKTOTAL, CKMB, CKMBINDEX, TROPONINI in the last 168 hours. BNP (last 3 results) No results for input(s): PROBNP in the last 8760 hours. HbA1C: No results for input(s): HGBA1C in the last 72 hours. CBG: No results for input(s): GLUCAP in the last 168 hours. Lipid Profile: No results for input(s): CHOL, HDL, LDLCALC, TRIG, CHOLHDL, LDLDIRECT in the last 72 hours. Thyroid Function Tests: No results for input(s): TSH, T4TOTAL, FREET4, T3FREE, THYROIDAB in the last 72 hours. Anemia Panel: No results for input(s): VITAMINB12, FOLATE, FERRITIN, TIBC, IRON, RETICCTPCT  in the last 72 hours. Sepsis Labs: No results for input(s): PROCALCITON, LATICACIDVEN in the last 168 hours.  Recent Results (from the past 240 hour(s))  SARS Coronavirus 2 by RT PCR (hospital order, performed in [facility] hospital lab) Nasopharyngeal Nasopharyngeal Swab     Status: None   Collection Time: 05/03/20 10:25 AM   Specimen: Nasopharyngeal Swab  Result Value Ref Range Status   SARS Coronavirus 2 NEGATIVE NEGATIVE Final    Comment: (NOTE) SARS-CoV-2 target nucleic acids are NOT DETECTED. The SARS-CoV-2 RNA is generally detectable in upper and lower respiratory specimens during the acute phase of infection. The lowest concentration of SARS-CoV-2 viral copies this assay can detect is 250 copies / mL. A negative result does not preclude SARS-CoV-2 infection and should not be used as the sole basis for treatment or other patient management decisions.  A negative result may occur with improper specimen collection / handling, submission of specimen other than nasopharyngeal swab, presence of viral mutation(s) within the areas targeted by this assay, and inadequate number of viral copies (<250 copies / mL). A negative result must be combined with  clinical observations, patient history, and epidemiological information. Fact Sheet for Patients:   StrictlyIdeas.no Fact Sheet for Healthcare Providers: BankingDealers.co.za This test is not yet approved or cleared  by the [place] FDA and has been authorized for detection and/or diagnosis of SARS-CoV-2 by FDA under an Emergency Use Authorization (EUA).  This EUA will remain in effect (meaning this test can be used) for the duration of the COVID-19 declaration under Section 564(b)(1) of the Act, 21 U.S.C. section 360bbb-3(b)(1), unless the authorization is terminated or revoked sooner. Performed at [facility], [address] [place], [place] [address]   Aerobic/Anaerobic Culture (surgical/deep wound)     Status: None   Collection Time: 05/03/20 12:51 PM   Specimen: Gallbladder; Bile  Result Value Ref Range Status   Specimen Description   Final    GALL BLADDER Performed at [facility] [address], [place], [place] [address]    Special Requests   Final    Normal Performed at [facility], [place], [place] [address]    Gram Stain   Final    RARE WBC PRESENT,BOTH PMN AND MONONUCLEAR FEW GRAM POSITIVE COCCI IN CHAINS    Culture   Final    ABUNDANT ENTEROCOCCUS FAECIUM ABUNDANT ENTEROCOCCUS FAECALIS NO ANAEROBES ISOLATED Performed at [facility], [place] [address], [place], [place] [address]    Report Status 05/08/2020 FINAL  Final   Organism ID, Bacteria ENTEROCOCCUS FAECIUM  Final   Organism ID, Bacteria ENTEROCOCCUS FAECALIS  Final      Susceptibility   Enterococcus faecalis - MIC*    AMPICILLIN <=2 SENSITIVE Sensitive     VANCOMYCIN 2 SENSITIVE Sensitive     GENTAMICIN SYNERGY SENSITIVE Sensitive     * ABUNDANT ENTEROCOCCUS FAECALIS   Enterococcus faecium - MIC*    AMPICILLIN <=2 SENSITIVE Sensitive     VANCOMYCIN 1 SENSITIVE Sensitive     GENTAMICIN SYNERGY  SENSITIVE Sensitive     * ABUNDANT ENTEROCOCCUS FAECIUM  Culture, blood (routine x 2)     Status: None (Preliminary result)   Collection Time: 05/08/20  6:42 PM   Specimen: BLOOD  Result Value Ref Range Status   Specimen Description BLOOD LEFT ANTECUBITAL  Final   Special Requests   Final    BOTTLES DRAWN AEROBIC AND ANAEROBIC Blood Culture adequate volume   Culture  Final    NO GROWTH 2 DAYS Performed at [facility], [address] [place], [place] [address]    Report Status PENDING  Incomplete  Culture, blood (routine x 2)     Status: None (Preliminary result)   Collection Time: 05/08/20  6:42 PM   Specimen: BLOOD  Result Value Ref Range Status   Specimen Description BLOOD BLOOD LEFT HAND  Final   Special Requests   Final    BOTTLES DRAWN AEROBIC ONLY Blood Culture results may not be optimal due to an inadequate volume of blood received in culture bottles   Culture   Final    NO GROWTH 2 DAYS Performed at [facility], [address], [place], [place] [address]    Report Status PENDING  Incomplete     Radiology Studies: No results found.  Scheduled Meds: . cholecalciferol  2,000 Units Oral Daily  . enoxaparin (LOVENOX) injection  40 mg Subcutaneous Q24H  . feeding supplement  1 Container Oral TID BM  . finasteride  5 mg Oral Daily  . metoprolol succinate  25 mg Oral Daily  . pantoprazole (PROTONIX) IV  40 mg Intravenous Q24H  . sodium chloride flush  5 mL Intracatheter Q8H   Continuous Infusions: . sodium chloride 1,000 mL (05/10/20 1047)  . metronidazole 500 mg (05/10/20 1625)  . vancomycin 1,000 mg (05/10/20 1618)     LOS: 8 days    Enzo Bi, MD [facility]  If 7PM-7AM, please contact night-coverage [email or web address]  05/10/2020, 4:50 PM

## 2020-05-10 NOTE — Progress Notes (Addendum)
Physical Therapy Treatment Patient Details Name: Jose Castro MRN: [id number] DOB: 1926-06-06 Today's Date: 05/10/2020    History of Present Illness 86 y.o. male who was admitted to [facility] with Acute Cholecystitis with enterococcus, faecium, and Faecalis S/P IR Biliary drain. PMHx includes: HTN, Dyslipidemia, BPH, CVA, GERD    PT Comments    Pt was supine in bed upon arriving. He is alert and oriented x 2. Cognition is different from baseline according to daughter. Pt was completely independent with ADLs prior to admission. Pt required max assist with applying briefs prior to OOB activity. He required min assist this date to exit and re-enter bed after OOB activity. Pt required much more assistance this date versus previous PT session 2 days prior. He requested to use BSC prior to ambulation into hallway. Min assist to stand from lower bed height but CGA for safety with moderate Vcs to stand from higher BSC height. After having BM, pt was able to ambulate 50 ft with slow, flexed, gait kinematics. Pt is at high fall risk and required 2 occasions of min assist for safety to prevent LOB. He relies heavily on RW for support throughout all standing activity. Has a lot of stairs at home and is currently unsafe to even trial stair navigation.  Lengthy discussion with pt and pt's daughter about regression of strength/ safe function mobility over past few days. Therapist discussed option of DC to rehab versus home when medically stable. Therapist recommends DC to SNF to address strength, balance, and safe functional mobility deficits. Pt is agreeable to rehab and requesting to go to [facility].  He will benefit from continued skilled PT to address these deficits and to progress to PLOF. At conclusion of session, pt was supine in bed with bed alarm in place and daughter at bedside. CM notified of recommendation change and pt's willingness to go to rehab when medically stable.    Follow Up Recommendations  SNF      Equipment Recommendations  None recommended by PT    Recommendations for Other Services       Precautions / Restrictions Precautions Precautions: Fall Restrictions Weight Bearing Restrictions: No    Mobility  Bed Mobility Overal bed mobility: Needs Assistance Bed Mobility: Supine to Sit;Sit to Supine     Supine to sit: Min assist Sit to supine: Min assist   General bed mobility comments: Min assist required to exit and re-enter bed. Vcs throughout for technique improvements. sat EOB x several minutes prior to OOB activity  Transfers Overall transfer level: Needs assistance Equipment used: Rolling walker (2 wheeled) Transfers: Sit to/from Stand Sit to Stand: Min assist;Min guard         General transfer comment: MIn assist to stand from lower bed height but pt was able to progress to CGA for STS from higher BSC surface. Vcs throughout transfers for hand placement, and fwd wt shift  Ambulation/Gait Ambulation/Gait assistance: Min guard;Min assist Gait Distance (Feet): 50 Feet Assistive device: Rolling walker (2 wheeled) Gait Pattern/deviations: Step-through pattern;Trunk flexed;Narrow base of support;Decreased stride length Gait velocity: decreased   General Gait Details: Pt was able to ambulate 50 ft with mostly CGA however did have 2 occasions of min assist required to prevent LOB. pt has poor gait posture and tends to let RW out in front of him to far.  Unsafe to progress gait distances further 2/2 to fatigue/SOB. unsafe to trial stair training.   Stairs  Wheelchair Mobility    Modified Rankin (Stroke Patients Only)       Balance Overall balance assessment: Needs assistance Sitting-balance support: Feet supported;Bilateral upper extremity supported Sitting balance-Leahy Scale: Good Sitting balance - Comments: no LOB seated EOB with all extremities supporting   Standing balance support: Bilateral upper extremity supported;During  functional activity Standing balance-Leahy Scale: Fair Standing balance comment: pt relies heavily on RW for standing balance.                            Cognition Arousal/Alertness: Awake/alert Behavior During Therapy:  (slightly impulsive) Overall Cognitive Status: History of cognitive impairments - at baseline                                 General Comments: pt continues to present with cognition different from baseline. He is alert throughout and is oriented x 2. He is able to follow commands consistently throughout      Exercises      General Comments        Pertinent Vitals/Pain Pain Assessment: No/denies pain Pain Score: 0-No pain Pain Intervention(s): Monitored during session    Home Living                      Prior Function            PT Goals (current goals can now be found in the care plan section) Acute Rehab PT Goals Patient Stated Goal: To go home Progress towards PT goals: Progressing toward goals    Frequency    Min 2X/week      PT Plan Discharge plan needs to be updated    Co-evaluation              AM-PAC PT "6 Clicks" Mobility   Outcome Measure  Help needed turning from your back to your side while in a flat bed without using bedrails?: A Little Help needed moving from lying on your back to sitting on the side of a flat bed without using bedrails?: A Little Help needed moving to and from a bed to a chair (including a wheelchair)?: A Little Help needed standing up from a chair using your arms (e.g., wheelchair or bedside chair)?: A Little Help needed to walk in hospital room?: A Little Help needed climbing 3-5 steps with a railing? : A Lot 6 Click Score: 17    End of Session Equipment Utilized During Treatment: Gait belt Activity Tolerance: Patient tolerated treatment well;Patient limited by fatigue Patient left: in bed;with call bell/phone within reach;with bed alarm set;with family/visitor  present Nurse Communication: Mobility status PT Visit Diagnosis: Muscle weakness (generalized) (M62.81);Difficulty in walking, not elsewhere classified (R26.2)     Time: 1410-1450 PT Time Calculation (min) (ACUTE ONLY): 40 min  Charges:  $Gait Training: 8-22 mins $Therapeutic Activity: 23-37 mins                     Julaine Fusi PTA 05/10/20, 4:10 PM

## 2020-05-10 NOTE — TOC Progression Note (Signed)
Transition of Care [facility]) - Progression Note    Patient Details  Name: Jose Castro MRN: [id number] Date of Birth: 1926/06/26  Transition of [facility]) CM/SW [place], LCSW Phone Number: 05/10/2020, 1:05 PM  Clinical Narrative:  Received call from patient's daughter, Renata Caprice. She works at [employer] and wanted to see if patient would qualify for SNF placement there. CSW reviewed therapy note with her and explained that insurance likely would not cover SNF due to how well he is ambulating. CSW called [facility] and she agreed. CSW updated daughter and she is agreeable to continuing plan for home with home health.   Expected Discharge Plan: [place] Barriers to Discharge: Continued Medical Work up  Expected Discharge Plan and Services Expected Discharge Plan: [place] Choice: [place] arrangements for the past 2 months: Single Family Home                                       Social Determinants of Health (SDOH) Interventions    Readmission Risk Interventions No flowsheet data found.

## 2020-05-10 NOTE — Progress Notes (Addendum)
AVSS. Doing well. Tolerating diet well. + BM by patient and family report. Labs, except for potassium all trending well. Lungs: Clear.  ABD: Soft, non-tender. Ambulating well.  For ERCP tomorrow.   Will clarify with Dr Kathlene Cote options for biliary drain removal post ERCP.   Case reviewed w/ Dr. Kathlene Cote from [place].  Home with tube on drainage for one month, outpatient cholangiogram, trial tube clamping and then removal. (Unless patient is candidate for cholecystectomy and then the tube will be removed at the time of surgery.)

## 2020-05-10 NOTE — Progress Notes (Signed)
Informed NP Ouma potassium level 2.4

## 2020-05-10 NOTE — Care Management Important Message (Signed)
Important Message  Patient Details  Name: Jose Castro MRN: [id number] Date of Birth: 06-16-1926   Medicare Important Message Given:  Yes     Dannette Barbara 05/10/2020, 2:26 PM

## 2020-05-10 NOTE — Progress Notes (Signed)
NPO after midnight for procedure. Drain output monitoring. Consent signed and placed on the chart.

## 2020-05-11 ENCOUNTER — Encounter
Admission: AD | Disposition: A | Discharge status: Skilled Nursing Facility | Payer: Medicare Other | Source: Ambulatory Visit | Attending: Hospitalist

## 2020-05-11 ENCOUNTER — Inpatient Hospital Stay: Payer: Medicare Other

## 2020-05-11 ENCOUNTER — Encounter: Payer: Self-pay | Admitting: Family Medicine

## 2020-05-11 ENCOUNTER — Inpatient Hospital Stay: Payer: Medicare Other | Admitting: Certified Registered Nurse Anesthetist

## 2020-05-11 DIAGNOSIS — R932 Abnormal findings on diagnostic imaging of liver and biliary tract: Secondary | ICD-10-CM

## 2020-05-11 DIAGNOSIS — K8040 Calculus of bile duct with cholecystitis, unspecified, without obstruction: Secondary | ICD-10-CM

## 2020-05-11 HISTORY — PX: ENDOSCOPIC RETROGRADE CHOLANGIOPANCREATOGRAPHY (ERCP) WITH PROPOFOL: SHX5810

## 2020-05-11 LAB — CBC
HCT: 37.8 % — ABNORMAL LOW (ref 39.0–52.0)
Hemoglobin: 13.3 g/dL (ref 13.0–17.0)
MCH: 31.1 pg (ref 26.0–34.0)
MCHC: 35.2 g/dL (ref 30.0–36.0)
MCV: 88.5 fL (ref 80.0–100.0)
Platelets: 263 10*3/uL (ref 150–400)
RBC: 4.27 MIL/uL (ref 4.22–5.81)
RDW: 14.7 % (ref 11.5–15.5)
WBC: 10.5 10*3/uL (ref 4.0–10.5)
nRBC: 0 % (ref 0.0–0.2)

## 2020-05-11 LAB — MAGNESIUM: Magnesium: 1.6 mg/dL — ABNORMAL LOW (ref 1.7–2.4)

## 2020-05-11 LAB — COMPREHENSIVE METABOLIC PANEL
ALT: 31 U/L (ref 0–44)
AST: 17 U/L (ref 15–41)
Albumin: 2.2 g/dL — ABNORMAL LOW (ref 3.5–5.0)
Alkaline Phosphatase: 84 U/L (ref 38–126)
Anion gap: 8 (ref 5–15)
BUN: 14 mg/dL (ref 8–23)
CO2: 23 mmol/L (ref 22–32)
Calcium: 7.2 mg/dL — ABNORMAL LOW (ref 8.9–10.3)
Chloride: 105 mmol/L (ref 98–111)
Creatinine, Ser: 1.13 mg/dL (ref 0.61–1.24)
GFR calc Af Amer: >60 mL/min (ref >60)
GFR calc non Af Amer: 57 mL/min — ABNORMAL LOW (ref >60)
Glucose, Bld: 120 mg/dL — ABNORMAL HIGH (ref 70–99)
Potassium: 3.1 mmol/L — ABNORMAL LOW (ref 3.5–5.1)
Sodium: 136 mmol/L (ref 135–145)
Total Bilirubin: 1.4 mg/dL — ABNORMAL HIGH (ref 0.3–1.2)
Total Protein: 4.8 g/dL — ABNORMAL LOW (ref 6.5–8.1)

## 2020-05-11 SURGERY — ENDOSCOPIC RETROGRADE CHOLANGIOPANCREATOGRAPHY (ERCP) WITH PROPOFOL
Anesthesia: General

## 2020-05-11 MED ORDER — INDOMETHACIN 50 MG RE SUPP
RECTAL | Status: AC
  Filled 2020-05-11: qty 1

## 2020-05-11 MED ORDER — ENSURE MAX PROTEIN PO LIQD
11.0000 [oz_av] | Freq: Two times a day (BID) | ORAL | Status: DC
  Administered 2020-05-12 – 2020-05-14 (×5): 11 [oz_av] via ORAL
  Filled 2020-05-11: qty 330

## 2020-05-11 MED ORDER — MAGNESIUM SULFATE 2 GM/50ML IV SOLN
2.0000 g | Freq: Once | INTRAVENOUS | Status: AC
  Administered 2020-05-11: 2 g via INTRAVENOUS
  Filled 2020-05-11: qty 50

## 2020-05-11 MED ORDER — LIDOCAINE HCL (CARDIAC) PF 100 MG/5ML IV SOSY
PREFILLED_SYRINGE | INTRAVENOUS | Status: DC | PRN
  Administered 2020-05-11: 100 mg via INTRAVENOUS

## 2020-05-11 MED ORDER — LACTATED RINGERS IV SOLN: Freq: Once | INTRAVENOUS | Status: AC

## 2020-05-11 MED ORDER — INDOMETHACIN 50 MG RE SUPP: 100.0000 mg | Freq: Once | RECTAL | Status: DC

## 2020-05-11 MED ORDER — PROPOFOL 10 MG/ML IV BOLUS
INTRAVENOUS | Status: DC | PRN
  Administered 2020-05-11: 70 mg via INTRAVENOUS

## 2020-05-11 MED ORDER — SODIUM CHLORIDE 0.9 % IV SOLN
1.5000 g | Freq: Four times a day (QID) | INTRAVENOUS | Status: DC
  Administered 2020-05-12 – 2020-05-13 (×5): 1.5 g via INTRAVENOUS
  Filled 2020-05-11 (×6): qty 4
  Filled 2020-05-11: qty 1.5
  Filled 2020-05-11: qty 4
  Filled 2020-05-11: qty 1.5

## 2020-05-11 MED ORDER — POTASSIUM CHLORIDE 10 MEQ/100ML IV SOLN
10.0000 meq | INTRAVENOUS | Status: AC
  Administered 2020-05-11 (×4): 10 meq via INTRAVENOUS
  Filled 2020-05-11 (×4): qty 100

## 2020-05-11 MED ORDER — PROPOFOL 500 MG/50ML IV EMUL
INTRAVENOUS | Status: DC | PRN
  Administered 2020-05-11: 80 ug/kg/min via INTRAVENOUS

## 2020-05-11 NOTE — Progress Notes (Signed)
Tmax 99.3, VSS. For ERCP today. Yesterday's note includes IR recommendations for cholecystostomy management. Will plan to see the patient in three weeks to arrange for cholangiogram and to discuss pros/cons of cholecystectomy

## 2020-05-11 NOTE — Progress Notes (Signed)
Initial Nutrition Assessment  DOCUMENTATION CODES:   Not applicable  INTERVENTION:   Ensure Max protein supplement BID, each supplement provides 150kcal and 30g of protein.  Dysphagia 3 diet (Low fat- restrictions added via [employer])  Pt is likely at moderate refeed risk; recommend monitor K, Mg and P labs daily until stable  NUTRITION DIAGNOSIS:   Inadequate oral intake related to acute illness as evidenced by per patient/family report.  GOAL:   Patient will meet greater than or equal to 90% of their needs  MONITOR:   PO intake, Supplement acceptance, Labs, Weight trends, Skin, I & O's  REASON FOR ASSESSMENT:   LOS    ASSESSMENT:   86 y.o. male who was admitted to [facility] with Acute Cholecystitis with enterococcus, faecium, and Faecalis S/P IR Biliary drain. PMHx includes: HTN, Dyslipidemia, BPH, CVA, GERD Pt s/p IR cholecystostomy tube 6/3  Met with pt and pt's daughter in room today. Pt dozing on and off at time of RD visit so majority of pt's history was obtained from daughter at bedside. Per chart, pt on a NPO/clear liquid diet from 6/1-6/3. Pt was able to advance to a regular diet by 6/5 but then developed nausea and vomiting and was made NPO again 6/7. Pt was found to have gastric ileus related to cholecystitis. Pt has since advanced to a soft diet and has been tolerating fairly well per pt's daughter. Her biggest complaint is that pt does not have his dentures so he is unable to eat a lot of the food that is being sent up. Pt is documented to be eating 100% of most meals. Pt is currently NPO for ERCP today. IR drain with 332m output. RD discussed with pt and pt's daughter low fat/high protein diet recommendations. RD will add a low fat/high protein diet supplement to help pt meet his estimated needs (chocolate). Of note, pt does not like Boost Breeze. RD will order a dysphagia 3 diet once diet advanced and add low fat diet restrictions via [employer]. Pt is at moderate  refeed risk.    Per chart, pt appears fairly weight stable at baseline. Pt has not been weighed since admit; RD will request weekly weights.   Medications reviewed and include: D3, lovenox, protonix, metronidazole, KCl, vancomycin   Labs reviewed: K 3.1(L), Mg 1.6(L)  NUTRITION - FOCUSED PHYSICAL EXAM:    Most Recent Value  Orbital Region No depletion  Upper Arm Region No depletion  Thoracic and Lumbar Region No depletion  Buccal Region No depletion  Temple Region Mild depletion  Clavicle Bone Region Mild depletion  Clavicle and Acromion Bone Region Mild depletion  Scapular Bone Region No depletion  Dorsal Hand No depletion  Patellar Region Mild depletion  Anterior Thigh Region No depletion  Posterior Calf Region No depletion  Edema (RD Assessment) Mild  Hair Reviewed  Eyes Reviewed  Mouth Reviewed  Skin Reviewed  Nails Reviewed     Diet Order:   Diet Order            Diet NPO time specified Except for: Sips with Meds  Diet effective midnight                EDUCATION NEEDS:   Education needs have been addressed  Skin:  Skin Assessment: Reviewed RN Assessment (ecchymosis)  Last BM:  6/10- type 7  Height:   Ht Readings from Last 1 Encounters:  05/03/20 5' 11"  (1.803 m)    Weight:   Wt Readings from Last  1 Encounters:  05/03/20 78.4 kg    Ideal Body Weight:  78.2 kg  BMI:  Body mass index is 24.11 kg/m.  Estimated Nutritional Needs:   Kcal:  1800-2100kcal/day  Protein:  90-100g/day  Fluid:  2L/day  Koleen Distance MS, RD, LDN Please refer to [facility] for RD and/or RD on-call/weekend/after hours pager

## 2020-05-11 NOTE — Progress Notes (Signed)
PROGRESS NOTE    Jose Castro  MAU:[id number] DOB: Jul 29, 1926 DOA: 05/02/2020 PCP: Steele Sizer, MD   Brief Narrative:  Jose Castro  is a 86 y.o. Caucasian male with a known history of hypertension, dyslipidemia, BPH, CVA, GERD and osteoarthritis who was directly admitted for epigastric pain and nausea with dry heaves since last night and that was preceded by an ER visit for substernal chest pain yesterday when he was ruled out ACS and discharged.  Today he was seen by his primary care physician who discussed the case with Dr. Tollie Pizza for direct admission. CT abdomen with gallbladder distention and thickening with probable small layering of stones.  Findings were concerning for cholecystitis.  CMP with transaminitis and elevated T bili,  concerning for obstructive jaundice. GI was also consulted for ERCP but patient has to be off from Eliquis for more than 48 hours for procedure.  Cholecystostomy tube was placed by IR today.  Subjective: Pt underwent ERCP today with 1 stone removed.  No fever, N/V/D.     Assessment & Plan:   Active Problems:   Acute cholecystitis   Calculus of bile duct with cholecystitis without obstruction   Abnormal findings on imaging of biliary tract  Acute cholecystitis s/p cholecystostomy tube Choledocholithiasis s/p ERCP  Patient afebrile with  improvement in leukocytosis.  Patient had cystostomy tube in place by IR it was changed 05/05/20 due to some leakage.  Improvement in his labs.  Tube draining yellow-colored fluid. Surgery is following Bile cultures with Enterococcus faecalis and Enterococcus faecium.  Has been on Vanc/flagyl. --IR cholangiogram on 6/8, which showed "Nonocclusive filling defect within the distal aspect of the CBD worrisome for nonocclusive choledocholithiasis." PLAN: --ERCP today with 1 stone removed --switch abx to IV unasyn, monitor for allergic rxn. --clear liquid diet  Nausea and vomiting 2/2 ileus, resolved Abdominal x-ray with  normal bowel and distended stomach.  NG tube was placed by surgery with good output.  pt passed NG tube clamping trial, NG tube removed --MIVF d/c'ed PLAN: --advance diet as tolerated  Electrolyte abnormalities recurrent hypokalemia and hypomagnesemia -Replete electrolytes as needed.  AKI, improved Patient appears dry.  Creatinine started trending down, 1.9 peak. baseline around 1.3.  Bladder scan with about 200 cc but that was not post voidal.  Renal ultrasound without any hydronephrosis.  Did receive some contrast for CT abdomen on the day of admission. --Hold IV hydration and encourage oral hydration  Chronic atrial fibrillation, status post pacemaker placement. -continue Toprol-XL. -continue to hold Eliquis for upcoming ERCP    Dyslipidemia with significantly elevated LFTs likely related to acute hepatitis. -Holding Crestor for now as well as fish oil, given elevated LFTs.   BPH. -continue finasteride    History of gout. No acute flareup.   Objective: Vitals:   05/11/20 1501 05/11/20 1521 05/11/20 1550 05/11/20 1552  BP: 125/67   (!) 145/80  Pulse: 61   (!) 59  Resp: 16  12 18   Temp: 98.1 F (36.7 C)   98.8 F (37.1 C)  TempSrc: Temporal   Axillary  SpO2:  94%  97%  Weight:      Height:        Intake/Output Summary (Last 24 hours) at 05/11/2020 1802 Last data filed at 05/11/2020 1614 Gross per 24 hour  Intake 2100.61 ml  Output 2400 ml  Net -299.39 ml   Filed Weights   05/03/20 0218  Weight: 78.4 kg    Examination:  Constitutional: sedated from procedure CV: RRR no  M,R,G. Distal pulses +2.  No cyanosis.   RESP: CTA B/L, normal respiratory effort  GI: +BS, ND, chole tube draining clear brown fluid Extremities: No pitting edema in BLE SKIN: warm, dry and intact    DVT prophylaxis: SCDs Code Status: Full Family Communication: Daughter updated at bedside today Disposition Plan:  Status is: Inpatient  Remains inpatient appropriate because:IV  treatments appropriate due to intensity of illness or inability to take PO   Dispo: The patient is from: Home              Anticipated d/c is to: Home              Anticipated d/c date is: 1-2 days              Patient currently is not medically stable to d/c.Need to monitor overnight after ERCP.   Consultants:   Surgery  IR  GI  Procedures:  IR guided Cholicystostomy tube placement.  Antimicrobials:  Flagyl Vancomycin  Data Reviewed: I have personally reviewed following labs and imaging studies  CBC: Recent Labs  Lab 05/06/20 0710 05/06/20 0710 05/07/20 0604 05/08/20 0441 05/09/20 0444 05/10/20 0429 05/11/20 0524  WBC 13.6*   < > 12.2* 11.7* 10.2 9.4 10.5  NEUTROABS 10.6*  --   --   --   --   --   --   HGB 15.8   < > 14.1 14.6 13.3 13.5 13.3  HCT 44.7   < > 39.3 41.5 38.6* 38.9* 37.8*  MCV 86.5   < > 86.4 89.4 90.0 89.6 88.5  PLT 226   < > 206 205 210 231 263   < > = values in this interval not displayed.   Basic Metabolic Panel: Recent Labs  Lab 05/07/20 0604 05/08/20 0441 05/09/20 0444 05/10/20 0429 05/11/20 0524  NA 137 140 139 139 136  K 2.9* 3.2* 3.3* 2.4* 3.1*  CL 105 107 106 106 105  CO2 23 21* 25 26 23   GLUCOSE 88 79 141* 115* 120*  BUN 37* 33* 30* 19 14  CREATININE 1.98* 1.80* 1.43* 1.25* 1.13  CALCIUM 8.0* 8.1* 7.7* 7.4* 7.2*  MG 1.7 2.0 1.7 1.4* 1.6*   GFR: Estimated Creatinine Clearance: 45.4 mL/min (by C-G formula based on SCr of 1.13 mg/dL). Liver Function Tests: Recent Labs  Lab 05/07/20 0604 05/08/20 0441 05/09/20 0444 05/10/20 0429 05/11/20 0524  AST 31 31 24 18 17   ALT 110* 83* 54* 42 31  ALKPHOS 137* 126 103 93 84  BILITOT 2.3* 2.9* 2.4* 1.7* 1.4*  PROT 4.8* 5.1* 4.7* 4.7* 4.8*  ALBUMIN 2.2* 2.5* 2.3* 2.2* 2.2*   No results for input(s): LIPASE, AMYLASE in the last 168 hours. No results for input(s): AMMONIA in the last 168 hours. Coagulation Profile: No results for input(s): INR, PROTIME in the last 168  hours. Cardiac Enzymes: No results for input(s): CKTOTAL, CKMB, CKMBINDEX, TROPONINI in the last 168 hours. BNP (last 3 results) No results for input(s): PROBNP in the last 8760 hours. HbA1C: No results for input(s): HGBA1C in the last 72 hours. CBG: No results for input(s): GLUCAP in the last 168 hours. Lipid Profile: No results for input(s): CHOL, HDL, LDLCALC, TRIG, CHOLHDL, LDLDIRECT in the last 72 hours. Thyroid Function Tests: No results for input(s): TSH, T4TOTAL, FREET4, T3FREE, THYROIDAB in the last 72 hours. Anemia Panel: No results for input(s): VITAMINB12, FOLATE, FERRITIN, TIBC, IRON, RETICCTPCT in the last 72 hours. Sepsis Labs: No results for input(s): PROCALCITON, LATICACIDVEN in  the last 168 hours.  Recent Results (from the past 240 hour(s))  SARS Coronavirus 2 by RT PCR (hospital order, performed in [facility] hospital lab) Nasopharyngeal Nasopharyngeal Swab     Status: None   Collection Time: 05/03/20 10:25 AM   Specimen: Nasopharyngeal Swab  Result Value Ref Range Status   SARS Coronavirus 2 NEGATIVE NEGATIVE Final    Comment: (NOTE) SARS-CoV-2 target nucleic acids are NOT DETECTED. The SARS-CoV-2 RNA is generally detectable in upper and lower respiratory specimens during the acute phase of infection. The lowest concentration of SARS-CoV-2 viral copies this assay can detect is 250 copies / mL. A negative result does not preclude SARS-CoV-2 infection and should not be used as the sole basis for treatment or other patient management decisions.  A negative result may occur with improper specimen collection / handling, submission of specimen other than nasopharyngeal swab, presence of viral mutation(s) within the areas targeted by this assay, and inadequate number of viral copies (<250 copies / mL). A negative result must be combined with clinical observations, patient history, and epidemiological information. Fact Sheet for Patients:    StrictlyIdeas.no Fact Sheet for Healthcare Providers: BankingDealers.co.za This test is not yet approved or cleared  by the [place] FDA and has been authorized for detection and/or diagnosis of SARS-CoV-2 by FDA under an Emergency Use Authorization (EUA).  This EUA will remain in effect (meaning this test can be used) for the duration of the COVID-19 declaration under Section 564(b)(1) of the Act, 21 U.S.C. section 360bbb-3(b)(1), unless the authorization is terminated or revoked sooner. Performed at [facility], [address] [place], [place] [address]   Aerobic/Anaerobic Culture (surgical/deep wound)     Status: None   Collection Time: 05/03/20 12:51 PM   Specimen: Gallbladder; Bile  Result Value Ref Range Status   Specimen Description   Final    GALL BLADDER Performed at [facility], [address], [place], [place] [address]    Special Requests   Final    Normal Performed at [facility], [place], [place] [address]    Gram Stain   Final    RARE WBC PRESENT,BOTH PMN AND MONONUCLEAR FEW GRAM POSITIVE COCCI IN CHAINS    Culture   Final    ABUNDANT ENTEROCOCCUS FAECIUM ABUNDANT ENTEROCOCCUS FAECALIS NO ANAEROBES ISOLATED Performed at [facility], [place] [address], [place], [place] [address]    Report Status 05/08/2020 FINAL  Final   Organism ID, Bacteria ENTEROCOCCUS FAECIUM  Final   Organism ID, Bacteria ENTEROCOCCUS FAECALIS  Final      Susceptibility   Enterococcus faecalis - MIC*    AMPICILLIN <=2 SENSITIVE Sensitive     VANCOMYCIN 2 SENSITIVE Sensitive     GENTAMICIN SYNERGY SENSITIVE Sensitive     * ABUNDANT ENTEROCOCCUS FAECALIS   Enterococcus faecium - MIC*    AMPICILLIN <=2 SENSITIVE Sensitive     VANCOMYCIN 1 SENSITIVE Sensitive     GENTAMICIN SYNERGY SENSITIVE Sensitive     * ABUNDANT ENTEROCOCCUS FAECIUM  Culture, blood (routine x 2)     Status:  None (Preliminary result)   Collection Time: 05/08/20  6:42 PM   Specimen: BLOOD  Result Value Ref Range Status   Specimen Description BLOOD LEFT ANTECUBITAL  Final   Special Requests   Final    BOTTLES DRAWN AEROBIC AND ANAEROBIC Blood Culture adequate volume   Culture   Final    NO GROWTH 3 DAYS Performed at [facility], [address] [place], [place] [address]    Report Status PENDING  Incomplete  Culture, blood (routine x 2)     Status: None (Preliminary result)   Collection Time: 05/08/20  6:42 PM   Specimen: BLOOD  Result Value Ref Range Status   Specimen Description BLOOD BLOOD LEFT HAND  Final   Special Requests   Final    BOTTLES DRAWN AEROBIC ONLY Blood Culture results may not be optimal due to an inadequate volume of blood received in culture bottles   Culture   Final    NO GROWTH 3 DAYS Performed at [facility], [address] [place], [place] [address]    Report Status PENDING  Incomplete     Radiology Studies: DG C-Arm 1-60 Min-No Report  Result Date: 05/11/2020 Fluoroscopy was utilized by the requesting physician.  No radiographic interpretation.    Scheduled Meds: . cholecalciferol  2,000 Units Oral Daily  . enoxaparin (LOVENOX) injection  40 mg Subcutaneous Q24H  . finasteride  5 mg Oral Daily  . indomethacin  100 mg Rectal Once  . indomethacin      . metoprolol succinate  25 mg Oral Daily  . pantoprazole (PROTONIX) IV  40 mg Intravenous Q24H  . [START ON 05/12/2020] Ensure Max Protein  11 oz Oral BID  . sodium chloride flush  5 mL Intracatheter Q8H   Continuous Infusions: . sodium chloride 50 mL/hr at 05/11/20 1423  . metronidazole 500 mg (05/11/20 1748)  . vancomycin 1,000 mg (05/11/20 1628)     LOS: 9 days    Enzo Bi, MD [facility]  If 7PM-7AM, please contact night-coverage [email or web address]  05/11/2020, 6:02 PM

## 2020-05-11 NOTE — NC FL2 (Signed)
[place] LEVEL OF CARE SCREENING TOOL     IDENTIFICATION  Patient Name: Jose Castro Birthdate: 10-14-1926 Sex: male Admission Date (Current Location): 05/02/2020  [facility] and [place] Number:  Engineering geologist and Address:  [facility], [address], [place], [place] [address]      Provider Number: [id number]  Attending Physician Name and Address:  Enzo Bi, MD  Relative Name and Phone Number:       Current Level of Care: Hospital Recommended Level of Care: [place] Prior Approval Number:    Date Approved/Denied:   PASRR Number: [id number]  Discharge Plan: SNF    Current Diagnoses: Patient Active Problem List   Diagnosis Date Noted   Acute cholecystitis 05/02/2020   Chronic venous insufficiency 12/05/2019   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    CKD (chronic kidney disease), stage II    New onset atrial fibrillation ([place])    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   Atherosclerosis of aorta ([place]) 11/22/2015   Carotid artery narrowing 11/22/2015   Diverticulosis of colon 11/22/2015   Decreased creatinine clearance 11/22/2015   Arthritis, degenerative 11/22/2015   Lactose intolerance  11/22/2015   Basal cell carcinoma 11/22/2015   Essential hypertension 06/21/2015   Hyperlipemia 06/21/2015   GERD (gastroesophageal reflux disease) 06/21/2015   Calculus of kidney 11/18/2013    Orientation RESPIRATION BLADDER Height & Weight     Self, Situation, Place, Time  Normal Continent, External catheter Weight: 172 lb 13.5 oz (78.4 kg) Height:  5\' 11"  (180.3 cm)  BEHAVIORAL SYMPTOMS/MOOD NEUROLOGICAL BOWEL NUTRITION STATUS   (None)  (History of CVA) Continent Diet (See final recommendations on discharge summary.)  AMBULATORY STATUS COMMUNICATION OF NEEDS Skin   Limited Assist Verbally Bruising, Other (Comment) (Catheter entry/exit.)                       Personal Care Assistance Level of Assistance  Bathing, Feeding, Dressing Bathing Assistance: Limited assistance Feeding assistance: Limited assistance Dressing Assistance: Limited assistance     Functional Limitations Info  Sight, Hearing, Speech Sight Info: Adequate Hearing Info: Adequate Speech Info: Adequate    SPECIAL CARE FACTORS FREQUENCY  PT (By licensed PT), OT (By licensed OT)     PT Frequency: 5 x week OT Frequency: 5 x week            Contractures Contractures Info: Not present    Additional Factors Info  Code Status, Allergies Code Status Info: Full code Allergies Info: Penicillins           Current Medications (05/11/2020):  This is the current hospital active medication list Current Facility-Administered Medications  Medication Dose Route Frequency Provider Last Rate Last Admin   0.9 %  sodium chloride infusion   Intravenous PRN  Enzo Bi, MD 50 mL/hr at 05/11/20 0100 Rate Verify at 05/11/20 0100   cholecalciferol (VITAMIN D3) tablet 2,000 Units  2,000 Units Oral Daily Mansy, Jan A, MD   2,000 Units at 05/10/20 1107   enoxaparin (LOVENOX) injection 40 mg  40 mg Subcutaneous Q24H Dallie Piles, RPH   40 mg at 05/10/20 1619   feeding supplement (BOOST / RESOURCE BREEZE)  liquid 1 Container  1 Container Oral TID BM Lin Landsman, MD   1 Container at 05/10/20 1324   finasteride (PROSCAR) tablet 5 mg  5 mg Oral Daily Mansy, Jan A, MD   5 mg at 05/11/20 0851   iohexol (OMNIPAQUE) 300 MG/ML solution 50 mL  50 mL Per Tube Once PRN Enzo Bi, MD       magnesium sulfate IVPB 2 g 50 mL  2 g Intravenous Once Enzo Bi, MD 50 mL/hr at 05/11/20 0857 2 g at 05/11/20 0857   metoprolol succinate (TOPROL-XL) 24 hr tablet 25 mg  25 mg Oral Daily Mansy, Jan A, MD   25 mg at 05/11/20 0851   metroNIDAZOLE (FLAGYL) IVPB 500 mg  500 mg Intravenous Q8H Amin, Soundra Pilon, MD 100 mL/hr at 05/11/20 0232 500 mg at 05/11/20 0232   ondansetron (ZOFRAN) tablet 4 mg  4 mg Oral Q6H PRN Mansy, Jan A, MD       Or   ondansetron [facility]) injection 4 mg  4 mg Intravenous Q6H PRN Mansy, Jan A, MD   4 mg at 05/06/20 0814   pantoprazole (PROTONIX) injection 40 mg  40 mg Intravenous Q24H Robert Bellow, MD   40 mg at 05/10/20 1619   potassium chloride 10 mEq in 100 mL IVPB  10 mEq Intravenous Q1 Hr x 4 Enzo Bi, MD 100 mL/hr at 05/11/20 0900 10 mEq at 05/11/20 0900   promethazine (PHENERGAN) injection 12.5 mg  12.5 mg Intravenous Q6H PRN Lorella Nimrod, MD   12.5 mg at 05/06/20 0157   sodium chloride flush (NS) 0.9 % injection 5 mL  5 mL Intracatheter Q8H Aletta Edouard, MD   5 mL at 05/10/20 2259   traZODone (DESYREL) tablet 25 mg  25 mg Oral QHS PRN Mansy, Jan A, MD       vancomycin (VANCOCIN) IVPB 1000 mg/200 mL premix  1,000 mg Intravenous Q24H Dallie Piles, [place] at 05/10/20 1718     Discharge Medications: Please see discharge summary for a list of discharge medications.  Relevant Imaging Results:  Relevant Lab Results:   Additional Information SS#: [id number]. Has biliary drain.  Candie Chroman, LCSW

## 2020-05-11 NOTE — Op Note (Signed)
[facility] Gastroenterology Patient Name: Jose Castro Procedure Date: 05/11/2020 11:35 AM MRN: [id number] Account #: [id number] Date of Birth: [birth year] Admit Type: Outpatient Age: 86 Room: [facility] ENDO ROOM 4 Gender: Male Note Status: Finalized Procedure:             ERCP Indications:           Common bile duct stone(s) Providers:             [name] MD, MD Medicines:             Propofol per Anesthesia Complications:         No immediate complications. Procedure:             Pre-Anesthesia Assessment:                        - Prior to the procedure, a History and Physical was                         performed, and patient medications and allergies were                         reviewed. The patient's tolerance of previous                         anesthesia was also reviewed. The risks and benefits                         of the procedure and the sedation options and risks                         were discussed with the patient. All questions were                         answered, and informed consent was obtained. Prior                         Anticoagulants: The patient has taken Eliquis                         (apixaban), last dose was 1 week prior to procedure.                         ASA Grade Assessment: II - A patient with mild                         systemic disease. After reviewing the risks and                         benefits, the patient was deemed in satisfactory                         condition to undergo the procedure.                        After obtaining informed consent, the scope was passed                         under direct vision. Throughout the procedure, the  patient's blood pressure, pulse, and oxygen                         saturations were monitored continuously. The was                         introduced through the mouth, and used to inject                         contrast into and used to inject contrast  into the                         bile duct. The ERCP was accomplished without                         difficulty. The patient tolerated the procedure well.                         The ERCP was accomplished without difficulty. Findings:      The scout film was normal. The esophagus was successfully intubated       under direct vision. The scope was advanced to a normal major papilla in       the descending duodenum without detailed examination of the pharynx,       larynx and associated structures, and upper GI tract. The upper GI tract       was grossly normal. The bile duct was deeply cannulated with the       short-nosed traction sphincterotome. Contrast was injected. I personally       interpreted the bile duct images. There was brisk flow of contrast       through the ducts. Image quality was excellent. Contrast extended to the       entire biliary tree. The lower third of the main bile duct contained       filling defect(s). A wire was passed into the biliary tree. A 7 mm       biliary sphincterotomy was made with a traction (standard)       sphincterotome using ERBE electrocautery. There was no       post-sphincterotomy bleeding. The biliary tree was swept with a 15 mm       balloon starting at the bifurcation. One stone was removed. No stones       remained. Impression:            - A filling defect was seen on the cholangiogram.                        - Choledocholithiasis was found. Complete removal was                         accomplished by biliary sphincterotomy and balloon                         extraction.                        - A biliary sphincterotomy was performed.                        - The biliary tree was swept. Recommendation:        -  Return patient to hospital ward for ongoing care.                        - Clear liquid diet today.                        - Watch for pancreatitis, bleeding, perforation, and                         cholangitis. Procedure  Code(s):     --- Professional ---                        [phone], Endoscopic retrograde cholangiopancreatography                         (ERCP); with removal of calculi/debris from                         biliary/pancreatic duct(s)                        43262, Endoscopic retrograde cholangiopancreatography                         (ERCP); with sphincterotomy/papillotomy                        [phone], Endoscopic catheterization of the biliary                         ductal system, radiological supervision and                         interpretation Diagnosis Code(s):     --- Professional ---                        K80.50, Calculus of bile duct without cholangitis or                         cholecystitis without obstruction                        R93.2, Abnormal findings on diagnostic imaging of                         liver and biliary tract CPT copyright 2019 American Medical Association. All rights reserved. The codes documented in this report are preliminary and upon coder review may  be revised to meet current compliance requirements. [name] MD, MD 05/11/2020 3:00:36 PM This report has been signed electronically. Number of Addenda: 0 Note Initiated On: 05/11/2020 11:35 AM Estimated Blood Loss:  Estimated blood loss: none.      [facility]

## 2020-05-11 NOTE — Progress Notes (Signed)
Occupational Therapy Treatment Patient Details Name: Jose Castro MRN: [id number] DOB: 09-24-1926 Today's Date: 05/11/2020    History of present illness 86 y.o. male who was admitted to [facility] with Acute Cholecystitis with enterococcus, faecium, and Faecalis S/P IR Biliary drain. PMHx includes: HTN, Dyslipidemia, BPH, CVA, GERD   OT comments  [name] was seen for OT treatment on this date. Upon arrival to room pt easily awoken reporting no pain and daughter present at bed side. Pt agreeable to ADL tx session. Pt requires MIN A bed mobility this date. MAX A don/doff B socks seated EOB. SETUP + VCs for sequencing tooth brushing seated EOB. Anticipate MAX A toileting at [facility]. MIN A + elevated surface + RW sit<>stand at EOB c 2 side steps. Pt able to follow one step commands c increased time. Pt demonstrating increased assist for ADLs and mobility this date - updating frequency recommendations and pt now appropriate for STR upon discharge. Pt and daughter at bed side agreeable to rehab. Pt continues to benefit from skilled OT services to maximize return to PLOF and minimize risk of future falls, injury, caregiver burden, and readmission. Goals updated this session to reflect change in progress.     Follow Up Recommendations  SNF    Equipment Recommendations   (TBD at next venue of care)    Recommendations for Other Services      Precautions / Restrictions Precautions Precautions: Fall Restrictions Weight Bearing Restrictions: No       Mobility Bed Mobility Overal bed mobility: Needs Assistance Bed Mobility: Supine to Sit;Sit to Supine     Supine to sit: Min assist;HOB elevated Sit to supine: Min assist;HOB elevated   General bed mobility comments: MAX A x2 slide higher in bed   Transfers Overall transfer level: Needs assistance Equipment used: Rolling walker (2 wheeled) Transfers: Sit to/from Stand Sit to Stand: Min assist;From elevated surface              Balance  Overall balance assessment: Needs assistance Sitting-balance support: Feet supported;Bilateral upper extremity supported Sitting balance-Leahy Scale: Good     Standing balance support: Bilateral upper extremity supported;During functional activity Standing balance-Leahy Scale: Fair Standing balance comment: pt relies heavily on RW for standing balance.                           ADL either performed or assessed with clinical judgement   ADL Overall ADL's : Needs assistance/impaired                                       General ADL Comments: MAX A don/doff B socks seated EOB. SETUP + VCs for sequencing tooth brushing seated EOB. Anticipate MAX A toileting at [place] Arousal/Alertness: Awake/alert Behavior During Therapy: Impulsive Overall Cognitive Status: Impaired/Different from baseline Area of Impairment: Safety/judgement;Problem solving                         Safety/Judgement: Decreased awareness of safety;Decreased awareness of deficits   Problem Solving: Slow processing;Decreased initiation;Requires verbal cues;Requires tactile cues          Exercises Exercises: Other exercises Other Exercises Other Exercises: Pt and family educated re: d/c recs, safe RW technique,  IS frequency and use, falls prevention, energy conservation Other Exercises: Tooth brushing, LBD, bed mobility, sup<>sit, sit<>stand, sitting/standing balance/tolerance   Shoulder Instructions       General Comments      Pertinent Vitals/ Pain       Pain Assessment: No/denies pain Pain Score: 0-No pain  Home Living                                          Prior Functioning/Environment              Frequency  Min 2X/week        Progress Toward Goals  OT Goals(current goals can now be found in the care plan section)  Progress towards OT goals: Goals drowngraded-see care  plan  Acute Rehab OT Goals Patient Stated Goal: To go home OT Goal Formulation: With patient/family Time For Goal Achievement: 05/25/20 Potential to Achieve Goals: Fair ADL Goals Pt Will Perform Lower Body Dressing: with min assist;sit to/from stand (c LRAD PRN) Pt Will Transfer to Toilet: with min guard assist;stand pivot transfer;bedside commode (c LRAD PRN)  Plan Discharge plan needs to be updated;Frequency needs to be updated    Co-evaluation                 AM-PAC OT "6 Clicks" Daily Activity     Outcome Measure   Help from another person eating meals?: A Little Help from another person taking care of personal grooming?: A Little Help from another person toileting, which includes using toliet, bedpan, or urinal?: A Lot Help from another person bathing (including washing, rinsing, drying)?: A Lot Help from another person to put on and taking off regular upper body clothing?: A Little Help from another person to put on and taking off regular lower body clothing?: A Lot 6 Click Score: 15    End of Session Equipment Utilized During Treatment: Rolling walker  OT Visit Diagnosis: Muscle weakness (generalized) (M62.81);Other abnormalities of gait and mobility (R26.89)   Activity Tolerance Patient tolerated treatment well   Patient Left in bed;with call bell/phone within reach;with bed alarm set;with family/visitor present (MD in room at end of session)   Nurse Communication          Time: 0623-7628 OT Time Calculation (min): 25 min  Charges: OT General Charges $OT Visit: 1 Visit OT Treatments $Self Care/Home Management : 23-37 mins  Dessie Coma, M.S. OTR/L  05/11/20, 11:05 AM

## 2020-05-11 NOTE — Consult Note (Signed)
Pharmacy Antibiotic Note  Jose Castro is a 86 y.o. male admitted on 05/02/2020 with cholecystitis. Cx taking of Bile from Gallbladder grew abundant Enterococcus faecium and faecalis.   Pharmacy was consulted for vancomycin  dosing. The patient confirms rash w/ penicillins. This is day  # 9 of broad-spectrum antibiotics and day # 7 of vancomycin and metronidazole, renal function continues improve, leokocytosis has resolved with no recent febrile episodes  Plan:      1) continue vancomycin dose to 1000 mg IV every 24 hours T 1/2: 16.5 h, Ke 0.042 h-1  Css (calculated): 27.9/10.6 mcg/mL  2) continue Flagyl 500 mg IV every 8 hours    Height: 5\' 11"  (180.3 cm) Weight: 78.4 kg (172 lb 13.5 oz) IBW/kg (Calculated) : 75.3  Temp (24hrs), Avg:98.4 F (36.9 C), Min:97.4 F (36.3 C), Max:99.3 F (37.4 C)  Recent Labs  Lab 05/07/20 0604 05/08/20 0441 05/09/20 0444 05/10/20 0429 05/11/20 0524  WBC 12.2* 11.7* 10.2 9.4 10.5  CREATININE 1.98* 1.80* 1.43* 1.25* 1.13    Estimated Creatinine Clearance: 45.4 mL/min (by C-G formula based on SCr of 1.13 mg/dL).    Allergies  Allergen Reactions  . Penicillins Itching and Swelling    Has patient had a PCN reaction causing immediate rash, facial/tongue/throat swelling, SOB or lightheadedness with hypotension: No Has patient had a PCN reaction causing severe rash involving mucus membranes or skin necrosis: No Has patient had a PCN reaction that required hospitalization: No Has patient had a PCN reaction occurring within the last 10 years: No If all of the above answers are "NO", then may proceed with Cephalosporin use.     Antimicrobials this admission: ciprofloxacin 6/2 x 1  cefepime 6/1 >> 6/5 Metronidazole 6/2 >> Vancomycin 6/5>>  Microbiology results: [place] from gallbladder collected 6/3: Enterococcus faecium and faecalis    Thank you for allowing pharmacy to be a part of this patient's care.  Dallie Piles, PharmD, BCPS Clinical  Pharmacist 05/11/2020 7:05 AM

## 2020-05-11 NOTE — Anesthesia Preprocedure Evaluation (Signed)
Anesthesia Evaluation  Patient identified by MRN, date of birth, ID band Patient awake    Reviewed: Allergy & Precautions, H&P , NPO status , Patient's Chart, lab work & pertinent test results, reviewed documented beta blocker date and time   Airway Mallampati: II   Neck ROM: full    Dental  (+) Poor Dentition   Pulmonary neg pulmonary ROS, former smoker,    Pulmonary exam normal        Cardiovascular Exercise Tolerance: Poor hypertension, On Medications + dysrhythmias Atrial Fibrillation  Rhythm:regular Rate:Normal     Neuro/Psych  Neuromuscular disease CVA negative psych ROS   GI/Hepatic Neg liver ROS, GERD  ,  Endo/Other  negative endocrine ROS  Renal/GU Renal disease  negative genitourinary   Musculoskeletal   Abdominal   Peds  Hematology negative hematology ROS (+)   Anesthesia Other Findings Past Medical History: No date: Allergy     Comment:  Penicillin No date: Arthritis No date: Basal cell carcinoma No date: BPH (benign prostatic hyperplasia) No date: Chronic kidney disease     Comment:  had a kidney stone which per family was a cyst that was               removed No date: Dysrhythmia No date: GERD (gastroesophageal reflux disease) No date: History of kidney stones No date: Hyperlipidemia No date: Hypertension No date: Left-sided low back pain with left-sided sciatica No date: Splenic vein thrombosis 05/22/2019: Stroke ([place]) 12/2015: Thrombosis     Comment:  mural  area and no notation of heart attack Past Surgical History: No date: APPENDECTOMY No date: CATARACT EXTRACTION, BILATERAL No date: CYSTOSCOPY WITH INSERTION OF UROLIFT 08/2018: EYE SURGERY 05/04/2020: IR EXCHANGE BILIARY DRAIN No date: KIDNEY STONE SURGERY No date: KNEE SURGERY 07/13/2018: PACEMAKER INSERTION; N/A     Comment:  Procedure: INSERTION PACEMAKER-DUEL CHAMBER INITIAL               IMPLANT;  Surgeon: Isaias Cowman,  MD;  Location:               [facility] ORS;  Service: Cardiovascular;  Laterality: N/A; No date: SPINE SURGERY BMI    Body Mass Index: 24.11 kg/m     Reproductive/Obstetrics negative OB ROS                             Anesthesia Physical Anesthesia Plan  ASA: III  Anesthesia Plan: General   Post-op Pain Management:    Induction:   PONV Risk Score and Plan:   Airway Management Planned:   Additional Equipment:   Intra-op Plan:   Post-operative Plan:   Informed Consent: I have reviewed the patients History and Physical, chart, labs and discussed the procedure including the risks, benefits and alternatives for the proposed anesthesia with the patient or authorized representative who has indicated his/her understanding and acceptance.     Dental Advisory Given  Plan Discussed with: CRNA  Anesthesia Plan Comments:         Anesthesia Quick Evaluation

## 2020-05-11 NOTE — TOC Progression Note (Addendum)
Transition of Care [facility]) - Progression Note    Patient Details  Name: Jose Castro MRN: [id number] Date of Birth: Oct 20, 1926  Transition of Care [facility]) CM/SW [place], LCSW Phone Number: 05/11/2020, 9:37 AM  Clinical Narrative: PT has changed recommendation to SNF placement. [facility] is able to accept him. Patient and daughter at bedside are aware and agreeable. Per MD, patient would discharge tomorrow at the earliest. Patient and daughter aware. Faxed clinicals to [facility] for insurance authorization review. Patient has had both COVID vaccines (Moderna). Will not need a new COVID test prior to discharge. Notified [facility] admissions coordinator of patient's drain and that it could potentially be removed during his procedure today or he may have to have it another month per yesterday's surgery note. Sent MD a message to notify her that if patient discharges Saturday or Sunday, SNF will need discharge summary by noon of that day.  2:54 pm: Confirmed [facility] received clinicals that were faxed over this morning. Faxed updated OT note.  Expected Discharge Plan: [place] Barriers to Discharge: Continued Medical Work up  Expected Discharge Plan and Services Expected Discharge Plan: [place] Choice: [place] arrangements for the past 2 months: Single Family Home                                       Social Determinants of Health (SDOH) Interventions    Readmission Risk Interventions No flowsheet data found.

## 2020-05-11 NOTE — Transfer of Care (Signed)
Immediate Anesthesia Transfer of Care Note  Patient: Jose Castro  Procedure(s) Performed: ENDOSCOPIC RETROGRADE CHOLANGIOPANCREATOGRAPHY (ERCP) WITH PROPOFOL (N/A )  Patient Location: PACU and Endoscopy Unit  Anesthesia Type:General  Level of Consciousness: drowsy  Airway & Oxygen Therapy: Patient Spontanous Breathing and Patient connected to nasal cannula oxygen  Post-op Assessment: Report given to RN  Post vital signs: stable  Last Vitals:  Vitals Value Taken Time  BP    Temp    Pulse    Resp    SpO2      Last Pain:  Vitals:   05/11/20 1140  TempSrc: Oral  PainSc: 0-No pain      Patients Stated Pain Goal: 0 (95/58/31 6742)  Complications: No complications documented.

## 2020-05-12 DIAGNOSIS — K8042 Calculus of bile duct with acute cholecystitis without obstruction: Secondary | ICD-10-CM

## 2020-05-12 LAB — CBC
HCT: 39 % (ref 39.0–52.0)
Hemoglobin: 13.2 g/dL (ref 13.0–17.0)
MCH: 31.1 pg (ref 26.0–34.0)
MCHC: 33.8 g/dL (ref 30.0–36.0)
MCV: 92 fL (ref 80.0–100.0)
Platelets: 279 10*3/uL (ref 150–400)
RBC: 4.24 MIL/uL (ref 4.22–5.81)
RDW: 14.8 % (ref 11.5–15.5)
WBC: 10.3 10*3/uL (ref 4.0–10.5)
nRBC: 0 % (ref 0.0–0.2)

## 2020-05-12 LAB — COMPREHENSIVE METABOLIC PANEL
ALT: 25 U/L (ref 0–44)
AST: 17 U/L (ref 15–41)
Albumin: 2.2 g/dL — ABNORMAL LOW (ref 3.5–5.0)
Alkaline Phosphatase: 85 U/L (ref 38–126)
Anion gap: 8 (ref 5–15)
BUN: 14 mg/dL (ref 8–23)
CO2: 22 mmol/L (ref 22–32)
Calcium: 7.7 mg/dL — ABNORMAL LOW (ref 8.9–10.3)
Chloride: 107 mmol/L (ref 98–111)
Creatinine, Ser: 1.17 mg/dL (ref 0.61–1.24)
GFR calc Af Amer: >60 mL/min (ref >60)
GFR calc non Af Amer: 54 mL/min — ABNORMAL LOW (ref >60)
Glucose, Bld: 116 mg/dL — ABNORMAL HIGH (ref 70–99)
Potassium: 3.6 mmol/L (ref 3.5–5.1)
Sodium: 137 mmol/L (ref 135–145)
Total Bilirubin: 1.2 mg/dL (ref 0.3–1.2)
Total Protein: 4.7 g/dL — ABNORMAL LOW (ref 6.5–8.1)

## 2020-05-12 LAB — MAGNESIUM: Magnesium: 1.8 mg/dL (ref 1.7–2.4)

## 2020-05-12 MED ORDER — APIXABAN 5 MG PO TABS
5.0000 mg | ORAL_TABLET | Freq: Two times a day (BID) | ORAL | Status: DC
  Administered 2020-05-12 – 2020-05-14 (×4): 5 mg via ORAL
  Filled 2020-05-12 (×4): qty 1

## 2020-05-12 NOTE — Progress Notes (Signed)
PROGRESS NOTE    Jose Castro  FWY:[id number] DOB: 07-17-1926 DOA: 05/02/2020 PCP: Steele Sizer, MD   Brief Narrative:  Jose Castro  is a 86 y.o. Caucasian male with a known history of hypertension, dyslipidemia, BPH, CVA, GERD and osteoarthritis who was directly admitted for epigastric pain and nausea with dry heaves since last night and that was preceded by an ER visit for substernal chest pain yesterday when he was ruled out ACS and discharged.  Today he was seen by his primary care physician who discussed the case with Dr. Tollie Pizza for direct admission. CT abdomen with gallbladder distention and thickening with probable small layering of stones.  Findings were concerning for cholecystitis.  CMP with transaminitis and elevated T bili,  concerning for obstructive jaundice. GI was also consulted for ERCP but patient has to be off from Eliquis for more than 48 hours for procedure.  Cholecystostomy tube was placed by IR today.  Subjective: Pt reported doing well, post-ERCP.  No fever, dyspnea, chest pain, abdominal pain, N/V/D.   Assessment & Plan:   Active Problems:   Acute cholecystitis   Calculus of bile duct with cholecystitis without obstruction   Abnormal findings on imaging of biliary tract  Acute cholecystitis s/p cholecystostomy tube Choledocholithiasis s/p ERCP  Patient afebrile with  improvement in leukocytosis.  Patient had cystostomy tube in place by IR it was changed 05/05/20 due to some leakage.  Improvement in his labs.  Tube draining yellow-colored fluid. Surgery is following Bile cultures with Enterococcus faecalis and Enterococcus faecium.  Has been on Vanc/flagyl, switched to IV unasyn on 6/11 --IR cholangiogram on 6/8, which showed "Nonocclusive filling defect within the distal aspect of the CBD worrisome for nonocclusive choledocholithiasis." --ERCP 6/11 with 1 stone removed PLAN: --continue IV unasyn, monitor for allergic rxn. --advance diet  --Per Dr Margaretmary Dys  (IR), chole tube will stay in place for one month (from placement) and then repeat cholangiogram. Gen surg to see pt in three weeks after discharge and arrange for that study.   Nausea and vomiting 2/2 ileus, resolved Abdominal x-ray with normal bowel and distended stomach.  NG tube was placed by surgery with good output.  pt passed NG tube clamping trial, NG tube removed --MIVF d/c'ed PLAN: --advance diet as tolerated  Electrolyte abnormalities recurrent hypokalemia and hypomagnesemia -Replete electrolytes as needed.  AKI, improved Patient appears dry.  Creatinine started trending down, 1.9 peak. baseline around 1.3.  Bladder scan with about 200 cc but that was not post voidal.  Renal ultrasound without any hydronephrosis.  Did receive some contrast for CT abdomen on the day of admission. --Hold IV hydration and encourage oral hydration  Chronic atrial fibrillation, status post pacemaker placement. -continue Toprol-XL. --resume Eliquis     Dyslipidemia with significantly elevated LFTs likely related to acute hepatitis. -Holding Crestor for now as well as fish oil, given elevated LFTs.   BPH. -continue finasteride    History of gout. No acute flareup.   Objective: Vitals:   05/11/20 1927 05/12/20 0000 05/12/20 0528 05/12/20 1213  BP: (!) 161/69  125/67 136/78  Pulse: 62  60 60  Resp: 20  20 20   Temp: 98.5 F (36.9 C)  98 F (36.7 C) (!) 97.5 F (36.4 C)  TempSrc: Oral  Oral Oral  SpO2: 96%  97% 96%  Weight:  80.5 kg    Height:        Intake/Output Summary (Last 24 hours) at 05/12/2020 1339 Last data filed at 05/12/2020 2774 Gross  per 24 hour  Intake 1478.14 ml  Output 1450 ml  Net 28.14 ml   Filed Weights   05/03/20 0218 05/12/20 0000  Weight: 78.4 kg 80.5 kg    Examination:  Constitutional: NAD, AAOx3 HEENT: conjunctivae and lids normal, EOMI CV: RRR no M,R,G. Distal pulses +2.  No cyanosis.   RESP: CTA B/L, normal respiratory effort  GI: +BS, NTND,  chole drain outputting dark brown/green fluids Extremities: mild generalized swelling in extremities SKIN: warm, dry and intact Neuro: II - XII grossly intact.  Sensation intact    DVT prophylaxis: Eliquis Code Status: Full Family Communication: Daughter updated at bedside today Disposition Plan:  Status is: Inpatient  Remains inpatient appropriate because:IV treatments appropriate due to intensity of illness or inability to take PO   Dispo: The patient is from: Home              Anticipated d/c is to: SNF              Anticipated d/c date is: whenever bed available              Patient currently is medically stable to d/c.   Consultants:   Surgery  IR  GI  Procedures:  IR guided Cholicystostomy tube placement.  Antimicrobials:  Flagyl Vancomycin  Data Reviewed: I have personally reviewed following labs and imaging studies  CBC: Recent Labs  Lab 05/06/20 0710 05/07/20 0604 05/08/20 0441 05/09/20 0444 05/10/20 0429 05/11/20 0524 05/12/20 0537  WBC 13.6*   < > 11.7* 10.2 9.4 10.5 10.3  NEUTROABS 10.6*  --   --   --   --   --   --   HGB 15.8   < > 14.6 13.3 13.5 13.3 13.2  HCT 44.7   < > 41.5 38.6* 38.9* 37.8* 39.0  MCV 86.5   < > 89.4 90.0 89.6 88.5 92.0  PLT 226   < > 205 210 231 263 279   < > = values in this interval not displayed.   Basic Metabolic Panel: Recent Labs  Lab 05/08/20 0441 05/09/20 0444 05/10/20 0429 05/11/20 0524 05/12/20 0537  NA 140 139 139 136 137  K 3.2* 3.3* 2.4* 3.1* 3.6  CL 107 106 106 105 107  CO2 21* 25 26 23 22   GLUCOSE 79 141* 115* 120* 116*  BUN 33* 30* 19 14 14   CREATININE 1.80* 1.43* 1.25* 1.13 1.17  CALCIUM 8.1* 7.7* 7.4* 7.2* 7.7*  MG 2.0 1.7 1.4* 1.6* 1.8   GFR: Estimated Creatinine Clearance: 43.8 mL/min (by C-G formula based on SCr of 1.17 mg/dL). Liver Function Tests: Recent Labs  Lab 05/08/20 0441 05/09/20 0444 05/10/20 0429 05/11/20 0524 05/12/20 0537  AST 31 24 18 17 17   ALT 83* 54* 42 31 25   ALKPHOS 126 103 93 84 85  BILITOT 2.9* 2.4* 1.7* 1.4* 1.2  PROT 5.1* 4.7* 4.7* 4.8* 4.7*  ALBUMIN 2.5* 2.3* 2.2* 2.2* 2.2*   No results for input(s): LIPASE, AMYLASE in the last 168 hours. No results for input(s): AMMONIA in the last 168 hours. Coagulation Profile: No results for input(s): INR, PROTIME in the last 168 hours. Cardiac Enzymes: No results for input(s): CKTOTAL, CKMB, CKMBINDEX, TROPONINI in the last 168 hours. BNP (last 3 results) No results for input(s): PROBNP in the last 8760 hours. HbA1C: No results for input(s): HGBA1C in the last 72 hours. CBG: No results for input(s): GLUCAP in the last 168 hours. Lipid Profile: No results for input(s): CHOL, HDL,  LDLCALC, TRIG, CHOLHDL, LDLDIRECT in the last 72 hours. Thyroid Function Tests: No results for input(s): TSH, T4TOTAL, FREET4, T3FREE, THYROIDAB in the last 72 hours. Anemia Panel: No results for input(s): VITAMINB12, FOLATE, FERRITIN, TIBC, IRON, RETICCTPCT in the last 72 hours. Sepsis Labs: No results for input(s): PROCALCITON, LATICACIDVEN in the last 168 hours.  Recent Results (from the past 240 hour(s))  SARS Coronavirus 2 by RT PCR (hospital order, performed in [facility] hospital lab) Nasopharyngeal Nasopharyngeal Swab     Status: None   Collection Time: 05/03/20 10:25 AM   Specimen: Nasopharyngeal Swab  Result Value Ref Range Status   SARS Coronavirus 2 NEGATIVE NEGATIVE Final    Comment: (NOTE) SARS-CoV-2 target nucleic acids are NOT DETECTED. The SARS-CoV-2 RNA is generally detectable in upper and lower respiratory specimens during the acute phase of infection. The lowest concentration of SARS-CoV-2 viral copies this assay can detect is 250 copies / mL. A negative result does not preclude SARS-CoV-2 infection and should not be used as the sole basis for treatment or other patient management decisions.  A negative result may occur with improper specimen collection / handling, submission of specimen  other than nasopharyngeal swab, presence of viral mutation(s) within the areas targeted by this assay, and inadequate number of viral copies (<250 copies / mL). A negative result must be combined with clinical observations, patient history, and epidemiological information. Fact Sheet for Patients:   StrictlyIdeas.no Fact Sheet for Healthcare Providers: BankingDealers.co.za This test is not yet approved or cleared  by the [place] FDA and has been authorized for detection and/or diagnosis of SARS-CoV-2 by FDA under an Emergency Use Authorization (EUA).  This EUA will remain in effect (meaning this test can be used) for the duration of the COVID-19 declaration under Section 564(b)(1) of the Act, 21 U.S.C. section 360bbb-3(b)(1), unless the authorization is terminated or revoked sooner. Performed at [facility], [address], [place], [place] [address]   Aerobic/Anaerobic Culture (surgical/deep wound)     Status: None   Collection Time: 05/03/20 12:51 PM   Specimen: Gallbladder; Bile  Result Value Ref Range Status   Specimen Description   Final    GALL BLADDER Performed at [facility], [address] [place], [place] [address]    Special Requests   Final    Normal Performed at [facility], [place], [place] [address]    Gram Stain   Final    RARE WBC PRESENT,BOTH PMN AND MONONUCLEAR FEW GRAM POSITIVE COCCI IN CHAINS    Culture   Final    ABUNDANT ENTEROCOCCUS FAECIUM ABUNDANT ENTEROCOCCUS FAECALIS NO ANAEROBES ISOLATED Performed at [facility], [place] [address] [place], [place] [address]    Report Status 05/08/2020 FINAL  Final   Organism ID, Bacteria ENTEROCOCCUS FAECIUM  Final   Organism ID, Bacteria ENTEROCOCCUS FAECALIS  Final      Susceptibility   Enterococcus faecalis - MIC*    AMPICILLIN <=2 SENSITIVE Sensitive     VANCOMYCIN 2 SENSITIVE Sensitive      GENTAMICIN SYNERGY SENSITIVE Sensitive     * ABUNDANT ENTEROCOCCUS FAECALIS   Enterococcus faecium - MIC*    AMPICILLIN <=2 SENSITIVE Sensitive     VANCOMYCIN 1 SENSITIVE Sensitive     GENTAMICIN SYNERGY SENSITIVE Sensitive     * ABUNDANT ENTEROCOCCUS FAECIUM  Culture, blood (routine x 2)     Status: None (Preliminary result)   Collection Time: 05/08/20  6:42 PM   Specimen:  BLOOD  Result Value Ref Range Status   Specimen Description BLOOD LEFT ANTECUBITAL  Final   Special Requests   Final    BOTTLES DRAWN AEROBIC AND ANAEROBIC Blood Culture adequate volume   Culture   Final    NO GROWTH 4 DAYS Performed at [facility], [address] [place], [place] [address]    Report Status PENDING  Incomplete  Culture, blood (routine x 2)     Status: None (Preliminary result)   Collection Time: 05/08/20  6:42 PM   Specimen: BLOOD  Result Value Ref Range Status   Specimen Description BLOOD BLOOD LEFT HAND  Final   Special Requests   Final    BOTTLES DRAWN AEROBIC ONLY Blood Culture results may not be optimal due to an inadequate volume of blood received in culture bottles   Culture   Final    NO GROWTH 4 DAYS Performed at [facility], [address] [place], [place] [address]    Report Status PENDING  Incomplete     Radiology Studies: DG C-Arm 1-60 Min-No Report  Result Date: 05/11/2020 Fluoroscopy was utilized by the requesting physician.  No radiographic interpretation.    Scheduled Meds: . cholecalciferol  2,000 Units Oral Daily  . enoxaparin (LOVENOX) injection  40 mg Subcutaneous Q24H  . finasteride  5 mg Oral Daily  . indomethacin  100 mg Rectal Once  . metoprolol succinate  25 mg Oral Daily  . pantoprazole (PROTONIX) IV  40 mg Intravenous Q24H  . Ensure Max Protein  11 oz Oral BID  . sodium chloride flush  5 mL Intracatheter Q8H   Continuous Infusions: . sodium chloride Stopped (05/12/20 0538)  . ampicillin-sulbactam (UNASYN) IV 1.5 g (05/12/20  1053)     LOS: 10 days    Enzo Bi, MD [facility]  If 7PM-7AM, please contact night-coverage [email or web address]  05/12/2020, 1:39 PM

## 2020-05-12 NOTE — Progress Notes (Signed)
Jose Antigua, MD [address] [place], [place], [place], [address] [address], [place], [place], [place], [address] Phone: [phone]  Fax: [phone]   Subjective:  Patient denies any abdominal pain.  No nausea or vomiting.  Objective: Exam: Vital signs in last 24 hours: Vitals:   05/11/20 1927 05/12/20 0000 05/12/20 0528 05/12/20 1213  BP: (!) 161/69  125/67 136/78  Pulse: 62  60 60  Resp: 20  20 20   Temp: 98.5 F (36.9 C)  98 F (36.7 C) (!) 97.5 F (36.4 C)  TempSrc: Oral  Oral Oral  SpO2: 96%  97% 96%  Weight:  80.5 kg    Height:       Weight change:   Intake/Output Summary (Last 24 hours) at 05/12/2020 1610 Last data filed at 05/12/2020 1440 Gross per 24 hour  Intake 1278.14 ml  Output 1300 ml  Net -21.86 ml    General: No acute distress, AAO x3 Abd: Soft, NT/ND, No HSM Skin: Warm, no rashes Neck: Supple, Trachea midline   Lab Results: Lab Results  Component Value Date   WBC 10.3 05/12/2020   HGB 13.2 05/12/2020   HCT 39.0 05/12/2020   MCV 92.0 05/12/2020   PLT 279 05/12/2020   Micro Results: Recent Results (from the past 240 hour(s))  SARS Coronavirus 2 by RT PCR (hospital order, performed in [facility] hospital lab) Nasopharyngeal Nasopharyngeal Swab     Status: None   Collection Time: 05/03/20 10:25 AM   Specimen: Nasopharyngeal Swab  Result Value Ref Range Status   SARS Coronavirus 2 NEGATIVE NEGATIVE Final    Comment: (NOTE) SARS-CoV-2 target nucleic acids are NOT DETECTED. The SARS-CoV-2 RNA is generally detectable in upper and lower respiratory specimens during the acute phase of infection. The lowest concentration of SARS-CoV-2 viral copies this assay can detect is 250 copies / mL. A negative result does not preclude SARS-CoV-2 infection and should not be used as the sole basis for treatment or other patient management decisions.  A negative result may occur with improper specimen collection / handling, submission of  specimen other than nasopharyngeal swab, presence of viral mutation(s) within the areas targeted by this assay, and inadequate number of viral copies (<250 copies / mL). A negative result must be combined with clinical observations, patient history, and epidemiological information. Fact Sheet for Patients:   StrictlyIdeas.no Fact Sheet for Healthcare Providers: BankingDealers.co.za This test is not yet approved or cleared  by the [place] FDA and has been authorized for detection and/or diagnosis of SARS-CoV-2 by FDA under an Emergency Use Authorization (EUA).  This EUA will remain in effect (meaning this test can be used) for the duration of the COVID-19 declaration under Section 564(b)(1) of the Act, 21 U.S.C. section 360bbb-3(b)(1), unless the authorization is terminated or revoked sooner. Performed at [facility], [address] [place], [place] [address]   Aerobic/Anaerobic Culture (surgical/deep wound)     Status: None   Collection Time: 05/03/20 12:51 PM   Specimen: Gallbladder; Bile  Result Value Ref Range Status   Specimen Description   Final    GALL BLADDER Performed at [facility], [address] [place], [place] [address]    Special Requests   Final    Normal Performed at [facility], [place], [place] [address]    Gram Stain   Final    RARE WBC PRESENT,BOTH PMN AND MONONUCLEAR FEW GRAM POSITIVE COCCI IN CHAINS    Culture  Final    ABUNDANT ENTEROCOCCUS FAECIUM ABUNDANT ENTEROCOCCUS FAECALIS NO ANAEROBES ISOLATED Performed at [facility], [address], [place], [place] [address]    Report Status 05/08/2020 FINAL  Final   Organism ID, Bacteria ENTEROCOCCUS FAECIUM  Final   Organism ID, Bacteria ENTEROCOCCUS FAECALIS  Final      Susceptibility   Enterococcus faecalis - MIC*    AMPICILLIN <=2 SENSITIVE Sensitive     VANCOMYCIN 2 SENSITIVE Sensitive      GENTAMICIN SYNERGY SENSITIVE Sensitive     * ABUNDANT ENTEROCOCCUS FAECALIS   Enterococcus faecium - MIC*    AMPICILLIN <=2 SENSITIVE Sensitive     VANCOMYCIN 1 SENSITIVE Sensitive     GENTAMICIN SYNERGY SENSITIVE Sensitive     * ABUNDANT ENTEROCOCCUS FAECIUM  Culture, blood (routine x 2)     Status: None (Preliminary result)   Collection Time: 05/08/20  6:42 PM   Specimen: BLOOD  Result Value Ref Range Status   Specimen Description BLOOD LEFT ANTECUBITAL  Final   Special Requests   Final    BOTTLES DRAWN AEROBIC AND ANAEROBIC Blood Culture adequate volume   Culture   Final    NO GROWTH 4 DAYS Performed at [facility], [address] [place], [place] [address]    Report Status PENDING  Incomplete  Culture, blood (routine x 2)     Status: None (Preliminary result)   Collection Time: 05/08/20  6:42 PM   Specimen: BLOOD  Result Value Ref Range Status   Specimen Description BLOOD BLOOD LEFT HAND  Final   Special Requests   Final    BOTTLES DRAWN AEROBIC ONLY Blood Culture results may not be optimal due to an inadequate volume of blood received in culture bottles   Culture   Final    NO GROWTH 4 DAYS Performed at [facility], [address], [place], [place] [address]    Report Status PENDING  Incomplete   Studies/Results: DG C-Arm 1-60 Min-No Report  Result Date: 05/11/2020 Fluoroscopy was utilized by the requesting physician.  No radiographic interpretation.   Medications:  Scheduled Meds: . cholecalciferol  2,000 Units Oral Daily  . enoxaparin (LOVENOX) injection  40 mg Subcutaneous Q24H  . finasteride  5 mg Oral Daily  . indomethacin  100 mg Rectal Once  . metoprolol succinate  25 mg Oral Daily  . pantoprazole (PROTONIX) IV  40 mg Intravenous Q24H  . Ensure Max Protein  11 oz Oral BID  . sodium chloride flush  5 mL Intracatheter Q8H   Continuous Infusions: . sodium chloride Stopped (05/12/20 0538)  . ampicillin-sulbactam (UNASYN) IV 1.5 g  (05/12/20 1053)   PRN Meds:.sodium chloride, iohexol, ondansetron **OR** ondansetron (ZOFRAN) IV, promethazine, traZODone   Assessment: Active Problems:   Acute cholecystitis   Calculus of bile duct with cholecystitis without obstruction   Abnormal findings on imaging of biliary tract    Plan: No clinical symptoms of pancreatitis post ERCP yesterday  Further management as per primary team and surgery  GI service will sign off at this time, please page with any questions or concerns   LOS: 10 days   Jose Antigua, MD 05/12/2020, 4:10 PM

## 2020-05-13 LAB — CBC
HCT: 37.7 % — ABNORMAL LOW (ref 39.0–52.0)
Hemoglobin: 13.1 g/dL (ref 13.0–17.0)
MCH: 31.1 pg (ref 26.0–34.0)
MCHC: 34.7 g/dL (ref 30.0–36.0)
MCV: 89.5 fL (ref 80.0–100.0)
Platelets: 291 10*3/uL (ref 150–400)
RBC: 4.21 MIL/uL — ABNORMAL LOW (ref 4.22–5.81)
RDW: 14.9 % (ref 11.5–15.5)
WBC: 12.9 10*3/uL — ABNORMAL HIGH (ref 4.0–10.5)
nRBC: 0 % (ref 0.0–0.2)

## 2020-05-13 LAB — COMPREHENSIVE METABOLIC PANEL
ALT: 20 U/L (ref 0–44)
AST: 15 U/L (ref 15–41)
Albumin: 2.2 g/dL — ABNORMAL LOW (ref 3.5–5.0)
Alkaline Phosphatase: 80 U/L (ref 38–126)
Anion gap: 5 (ref 5–15)
BUN: 18 mg/dL (ref 8–23)
CO2: 25 mmol/L (ref 22–32)
Calcium: 7.8 mg/dL — ABNORMAL LOW (ref 8.9–10.3)
Chloride: 106 mmol/L (ref 98–111)
Creatinine, Ser: 1.04 mg/dL (ref 0.61–1.24)
GFR calc Af Amer: >60 mL/min (ref >60)
GFR calc non Af Amer: >60 mL/min (ref >60)
Glucose, Bld: 98 mg/dL (ref 70–99)
Potassium: 3.5 mmol/L (ref 3.5–5.1)
Sodium: 136 mmol/L (ref 135–145)
Total Bilirubin: 1.2 mg/dL (ref 0.3–1.2)
Total Protein: 4.8 g/dL — ABNORMAL LOW (ref 6.5–8.1)

## 2020-05-13 LAB — CULTURE, BLOOD (ROUTINE X 2)
Culture: NO GROWTH
Culture: NO GROWTH
Special Requests: ADEQUATE

## 2020-05-13 LAB — MAGNESIUM: Magnesium: 1.5 mg/dL — ABNORMAL LOW (ref 1.7–2.4)

## 2020-05-13 MED ORDER — MAGNESIUM SULFATE 2 GM/50ML IV SOLN
2.0000 g | Freq: Once | INTRAVENOUS | Status: AC
  Administered 2020-05-13: 2 g via INTRAVENOUS
  Filled 2020-05-13: qty 50

## 2020-05-13 NOTE — TOC Progression Note (Signed)
Transition of Care [facility]) - Progression Note    Patient Details  Name: FLORENTINO LAABS MRN: [id number] Date of Birth: May 22, 1926  Transition of Care [facility]) CM/SW [place], LCSW Phone Number: 05/13/2020, 8:59 AM  Clinical Narrative:    CSW Just checked insurance portal, authorization still pending. I put a message in to insurance asking for expedition for this patient. I will let you know if I hear anything.    Expected Discharge Plan: [place] Barriers to Discharge: Continued Medical Work up  Expected Discharge Plan and Services Expected Discharge Plan: [place] Choice: [place] arrangements for the past 2 months: Single Family Home                                       Social Determinants of Health (SDOH) Interventions    Readmission Risk Interventions No flowsheet data found.

## 2020-05-13 NOTE — Progress Notes (Signed)
PROGRESS NOTE    Jose Castro  QBH:[id number] DOB: [birth year] DOA: 05/02/2020 PCP: Steele Sizer, MD   Brief Narrative:  Jose Castro  is a 86 y.o. Caucasian male with a known history of hypertension, dyslipidemia, BPH, CVA, GERD and osteoarthritis who was directly admitted for epigastric pain and nausea with dry heaves since last night and that was preceded by an ER visit for substernal chest pain yesterday when he was ruled out ACS and discharged.  Today he was seen by his primary care physician who discussed the case with Dr. Tollie Pizza for direct admission. CT abdomen with gallbladder distention and thickening with probable small layering of stones.  Findings were concerning for cholecystitis.  CMP with transaminitis and elevated T bili,  concerning for obstructive jaundice. GI was also consulted for ERCP but patient has to be off from Eliquis for more than 48 hours for procedure.  Cholecystostomy tube was placed by IR today.  Subjective: Pt reported doing well.  No complaints.  Tolerating diet.  No fever, dyspnea, chest pain, abdominal pain, N/V/D.    Assessment & Plan:   Active Problems:   Acute cholecystitis   Calculus of bile duct with cholecystitis without obstruction   Abnormal findings on imaging of biliary tract  Acute cholecystitis s/p cholecystostomy tube Choledocholithiasis s/p ERCP  Patient afebrile with  improvement in leukocytosis.  Patient had cystostomy tube in place by IR it was changed 05/05/20 due to some leakage.  Improvement in his labs.  Tube draining yellow-colored fluid. Surgery is following Bile cultures with Enterococcus faecalis and Enterococcus faecium.  Has been on Vanc/flagyl, switched to IV unasyn on 6/11 --IR cholangiogram on 6/8, which showed "Nonocclusive filling defect within the distal aspect of the CBD worrisome for nonocclusive choledocholithiasis." --ERCP 6/11 with 1 stone removed PLAN: --d/c IV unasyn today after finishing 10 days of IV abx --Per  Dr Margaretmary Dys (IR), chole tube will stay in place for one month (from placement) and then repeat cholangiogram. Gen surg to see pt in three weeks after discharge and arrange for that study.  --flush chole tube TID  Nausea and vomiting 2/2 ileus, resolved Abdominal x-ray with normal bowel and distended stomach.  NG tube was placed by surgery with good output.  pt passed NG tube clamping trial, NG tube removed --MIVF d/c'ed PLAN: --advance diet as tolerated  Electrolyte abnormalities recurrent hypokalemia and hypomagnesemia -Replete electrolytes as needed.  AKI, improved Patient appears dry.  Creatinine started trending down, 1.9 peak. baseline around 1.3.  Bladder scan with about 200 cc but that was not post voidal.  Renal ultrasound without any hydronephrosis.  Did receive some contrast for CT abdomen on the day of admission. --Hold IV hydration and encourage oral hydration  Chronic atrial fibrillation, status post pacemaker placement. -continue Toprol-XL. --continue Eliquis     Dyslipidemia with significantly elevated LFTs likely related to acute hepatitis. -Holding Crestor for now as well as fish oil, given elevated LFTs.   BPH. -continue finasteride    History of gout. No acute flareup.   Objective: Vitals:   05/12/20 1213 05/12/20 2016 05/13/20 0100 05/13/20 0441  BP: 136/78 131/73  (!) 113/57  Pulse: 60 61  60  Resp: 20 16 18 16   Temp: (!) 97.5 F (36.4 C) 97.8 F (36.6 C)  97.9 F (36.6 C)  TempSrc: Oral Oral  Oral  SpO2: 96% 96%  97%  Weight:      Height:        Intake/Output Summary (Last 24 hours)  at 05/13/2020 1408 Last data filed at 05/13/2020 1115 Gross per 24 hour  Intake 1109.89 ml  Output 1911 ml  Net -801.11 ml   Filed Weights   05/03/20 0218 05/12/20 0000  Weight: 78.4 kg 80.5 kg    Examination:  Constitutional: NAD, AAOx3 HEENT: conjunctivae and lids normal, EOMI CV: RRR no M,R,G. Distal pulses +2.  No cyanosis.   RESP: CTA B/L,  normal respiratory effort  GI: +BS, NTND, chole drain outputting dark brown/green fluids Extremities: mild generalized swelling in extremities improved SKIN: warm, dry and intact Neuro: II - XII grossly intact.  Sensation intact    DVT prophylaxis: Eliquis Code Status: Full Family Communication: Daughter updated at bedside today Disposition Plan:  Status is: Inpatient  Dispo: The patient is from: Home              Anticipated d/c is to: SNF              Anticipated d/c date is: whenever bed available              Patient currently is medically stable to d/c.   Consultants:   Surgery  IR  GI  Procedures:  IR guided Cholicystostomy tube placement.  Antimicrobials:  Flagyl Vancomycin  Data Reviewed: I have personally reviewed following labs and imaging studies  CBC: Recent Labs  Lab 05/09/20 0444 05/10/20 0429 05/11/20 0524 05/12/20 0537 05/13/20 0624  WBC 10.2 9.4 10.5 10.3 12.9*  HGB 13.3 13.5 13.3 13.2 13.1  HCT 38.6* 38.9* 37.8* 39.0 37.7*  MCV 90.0 89.6 88.5 92.0 89.5  PLT 210 231 263 279 161   Basic Metabolic Panel: Recent Labs  Lab 05/09/20 0444 05/10/20 0429 05/11/20 0524 05/12/20 0537 05/13/20 0624  NA 139 139 136 137 136  K 3.3* 2.4* 3.1* 3.6 3.5  CL 106 106 105 107 106  CO2 25 26 23 22 25   GLUCOSE 141* 115* 120* 116* 98  BUN 30* 19 14 14 18   CREATININE 1.43* 1.25* 1.13 1.17 1.04  CALCIUM 7.7* 7.4* 7.2* 7.7* 7.8*  MG 1.7 1.4* 1.6* 1.8 1.5*   GFR: Estimated Creatinine Clearance: 49.3 mL/min (by C-G formula based on SCr of 1.04 mg/dL). Liver Function Tests: Recent Labs  Lab 05/09/20 0444 05/10/20 0429 05/11/20 0524 05/12/20 0537 05/13/20 0624  AST 24 18 17 17 15   ALT 54* 42 31 25 20   ALKPHOS 103 93 84 85 80  BILITOT 2.4* 1.7* 1.4* 1.2 1.2  PROT 4.7* 4.7* 4.8* 4.7* 4.8*  ALBUMIN 2.3* 2.2* 2.2* 2.2* 2.2*   No results for input(s): LIPASE, AMYLASE in the last 168 hours. No results for input(s): AMMONIA in the last 168  hours. Coagulation Profile: No results for input(s): INR, PROTIME in the last 168 hours. Cardiac Enzymes: No results for input(s): CKTOTAL, CKMB, CKMBINDEX, TROPONINI in the last 168 hours. BNP (last 3 results) No results for input(s): PROBNP in the last 8760 hours. HbA1C: No results for input(s): HGBA1C in the last 72 hours. CBG: No results for input(s): GLUCAP in the last 168 hours. Lipid Profile: No results for input(s): CHOL, HDL, LDLCALC, TRIG, CHOLHDL, LDLDIRECT in the last 72 hours. Thyroid Function Tests: No results for input(s): TSH, T4TOTAL, FREET4, T3FREE, THYROIDAB in the last 72 hours. Anemia Panel: No results for input(s): VITAMINB12, FOLATE, FERRITIN, TIBC, IRON, RETICCTPCT in the last 72 hours. Sepsis Labs: No results for input(s): PROCALCITON, LATICACIDVEN in the last 168 hours.  Recent Results (from the past 240 hour(s))  Culture, blood (routine  x 2)     Status: None   Collection Time: 05/08/20  6:42 PM   Specimen: BLOOD  Result Value Ref Range Status   Specimen Description BLOOD LEFT ANTECUBITAL  Final   Special Requests   Final    BOTTLES DRAWN AEROBIC AND ANAEROBIC Blood Culture adequate volume   Culture   Final    NO GROWTH 5 DAYS Performed at [facility], [address] [place], [place] [address]    Report Status 05/13/2020 FINAL  Final  Culture, blood (routine x 2)     Status: None   Collection Time: 05/08/20  6:42 PM   Specimen: BLOOD  Result Value Ref Range Status   Specimen Description BLOOD BLOOD LEFT HAND  Final   Special Requests   Final    BOTTLES DRAWN AEROBIC ONLY Blood Culture results may not be optimal due to an inadequate volume of blood received in culture bottles   Culture   Final    NO GROWTH 5 DAYS Performed at [facility], [address], [place], [place] [address]    Report Status 05/13/2020 FINAL  Final     Radiology Studies: DG C-Arm 1-60 Min-No Report  Result Date: 05/11/2020 Fluoroscopy was utilized  by the requesting physician.  No radiographic interpretation.    Scheduled Meds: . apixaban  5 mg Oral BID  . cholecalciferol  2,000 Units Oral Daily  . finasteride  5 mg Oral Daily  . indomethacin  100 mg Rectal Once  . metoprolol succinate  25 mg Oral Daily  . pantoprazole (PROTONIX) IV  40 mg Intravenous Q24H  . Ensure Max Protein  11 oz Oral BID  . sodium chloride flush  5 mL Intracatheter Q8H   Continuous Infusions: . sodium chloride Stopped (05/12/20 1852)     LOS: 11 days    Enzo Bi, MD [facility]  If 7PM-7AM, please contact night-coverage [email or web address]  05/13/2020, 2:08 PM

## 2020-05-13 NOTE — Progress Notes (Signed)
Physical Therapy Treatment Patient Details Name: Jose Castro MRN: [id number] DOB: 05/13/1926 Today's Date: 05/13/2020    History of Present Illness 86 y.o. male who was admitted to [facility] with Acute Cholecystitis with enterococcus, faecium, and Faecalis S/P IR Biliary drain. PMHx includes: HTN, Dyslipidemia, BPH, CVA, GERD    PT Comments    Patient is making slow progress with progression of functional mobility. Patient required Min A for supine to short sitting, Min A for transfers with cues for safety, and Min A for steadying to ambulate a short distance in room with rolling walker. Patient unable to ambulate further today due to fatigue with minimal activity. Therapeutic strengthening exercises also performed in supine and in sitting position with active participation and cues for technique. Multiple rest breaks are required today with activity. Recommend to continue PT to maximize function and safety to facilitate return to PLOF. SNF is recommended at discharge.   Follow Up Recommendations  SNF     Equipment Recommendations  None recommended by PT    Recommendations for Other Services       Precautions / Restrictions Precautions Precautions: Fall Restrictions Weight Bearing Restrictions: No    Mobility  Bed Mobility Overal bed mobility: Needs Assistance Bed Mobility: Supine to Sit     Supine to sit: Min assist;HOB elevated Sit to supine: Mod assist;HOB elevated   General bed mobility comments: Min A for trunk support to sit up on edge of bed. Mod A for BLE support to return to supine position. Mod A for rolling to right with cues for task initiation   Transfers Overall transfer level: Needs assistance Equipment used: Rolling walker (2 wheeled) Transfers: Sit to/from Stand Sit to Stand: Min assist;From elevated surface         General transfer comment: Min A for standing from bed and from [facility]. verbal cues for safe hand placement with sitting without carry over of  instruction with multiple trials   Ambulation/Gait Ambulation/Gait assistance: Min assist Gait Distance (Feet): 20 Feet Assistive device: Rolling walker (2 wheeled) Gait Pattern/deviations: Step-through pattern;Trunk flexed;Narrow base of support;Decreased stride length Gait velocity: decreased   General Gait Details: verbal cues for proper positioning of rolling walker and sequencing. patient is fatigued with activity and unable to ambulate further at this time due to fatigue. Sp02 100% on room air after activity.    Stairs             Wheelchair Mobility    Modified Rankin (Stroke Patients Only)       Balance Overall balance assessment: Needs assistance Sitting-balance support: Feet supported;Bilateral upper extremity supported Sitting balance-Leahy Scale: Good     Standing balance support: Bilateral upper extremity supported;During functional activity Standing balance-Leahy Scale: Fair Standing balance comment: patient relying heavily on RW for support to maintain standing balance. without UE support, patient is unsteady with posterior lean and required Min A for weightshifting                             Cognition Arousal/Alertness: Lethargic Behavior During Therapy: WFL for tasks assessed/performed Overall Cognitive Status: Impaired/Different from baseline Area of Impairment: Safety/judgement;Problem solving                         Safety/Judgement: Decreased awareness of safety;Decreased awareness of deficits   Problem Solving: Slow processing;Decreased initiation;Requires verbal cues;Requires tactile cues        Exercises General Exercises -  Upper Extremity Shoulder Flexion: AAROM;5 reps;Strengthening;Seated (verbal cues for technique. flexion to ~90 degrees ) Elbow Flexion: AAROM;Strengthening;Both;10 reps;Seated Elbow Extension: AAROM;Both;10 reps;Strengthening;Seated General Exercises - Lower Extremity Ankle Circles/Pumps:  AROM;10 reps;Supine;Strengthening Long Arc Quad: 10 reps;AAROM;Strengthening;Both;Seated Heel Slides: AAROM;Both;10 reps;Supine;Strengthening Hip ABduction/ADduction: AAROM;Strengthening;Both;10 reps;Supine Straight Leg Raises: AAROM;Both;Strengthening;10 reps;Supine Hip Flexion/Marching: Seated;AAROM;Both;10 reps;Strengthening Other Exercises Other Exercises: verbal cues for technique for exercises. patient fatigued and required multiple short rest breaks to complete exercises. Sp02 100% after LE exercise     General Comments        Pertinent Vitals/Pain Pain Assessment: No/denies pain    Home Living                      Prior Function            PT Goals (current goals can now be found in the care plan section) Acute Rehab PT Goals Patient Stated Goal: To go home PT Goal Formulation: With patient/family Time For Goal Achievement: 05/22/20 Potential to Achieve Goals: Good Progress towards PT goals: Progressing toward goals    Frequency    Min 2X/week      PT Plan Current plan remains appropriate    Co-evaluation              AM-PAC PT "6 Clicks" Mobility   Outcome Measure  Help needed turning from your back to your side while in a flat bed without using bedrails?: A Lot Help needed moving from lying on your back to sitting on the side of a flat bed without using bedrails?: A Little Help needed moving to and from a bed to a chair (including a wheelchair)?: A Lot Help needed standing up from a chair using your arms (e.g., wheelchair or bedside chair)?: A Little Help needed to walk in hospital room?: A Little Help needed climbing 3-5 steps with a railing? : A Lot 6 Click Score: 15    End of Session Equipment Utilized During Treatment: Gait belt Activity Tolerance: Patient limited by fatigue Patient left: in bed;with family/visitor present;with bed alarm set;with call bell/phone within reach Nurse Communication: Mobility status PT Visit Diagnosis:  Muscle weakness (generalized) (M62.81);Difficulty in walking, not elsewhere classified (R26.2)     Time: 8250-5397 PT Time Calculation (min) (ACUTE ONLY): 42 min  Charges:  $Gait Training: 8-22 mins $Therapeutic Exercise: 8-22 mins $Therapeutic Activity: 8-22 mins                     Minna Merritts, PT, MPT   Percell Locus 05/13/2020, 11:59 AM

## 2020-05-14 ENCOUNTER — Encounter: Payer: Self-pay | Admitting: Gastroenterology

## 2020-05-14 LAB — CBC
HCT: 38.1 % — ABNORMAL LOW (ref 39.0–52.0)
Hemoglobin: 13.5 g/dL (ref 13.0–17.0)
MCH: 31.8 pg (ref 26.0–34.0)
MCHC: 35.4 g/dL (ref 30.0–36.0)
MCV: 89.6 fL (ref 80.0–100.0)
Platelets: 331 10*3/uL (ref 150–400)
RBC: 4.25 MIL/uL (ref 4.22–5.81)
RDW: 15.1 % (ref 11.5–15.5)
WBC: 12 10*3/uL — ABNORMAL HIGH (ref 4.0–10.5)
nRBC: 0 % (ref 0.0–0.2)

## 2020-05-14 LAB — COMPREHENSIVE METABOLIC PANEL
ALT: 18 U/L (ref 0–44)
AST: 13 U/L — ABNORMAL LOW (ref 15–41)
Albumin: 2.4 g/dL — ABNORMAL LOW (ref 3.5–5.0)
Alkaline Phosphatase: 84 U/L (ref 38–126)
Anion gap: 7 (ref 5–15)
BUN: 23 mg/dL (ref 8–23)
CO2: 25 mmol/L (ref 22–32)
Calcium: 8.1 mg/dL — ABNORMAL LOW (ref 8.9–10.3)
Chloride: 107 mmol/L (ref 98–111)
Creatinine, Ser: 1.12 mg/dL (ref 0.61–1.24)
GFR calc Af Amer: >60 mL/min (ref >60)
GFR calc non Af Amer: 57 mL/min — ABNORMAL LOW (ref >60)
Glucose, Bld: 103 mg/dL — ABNORMAL HIGH (ref 70–99)
Potassium: 3.6 mmol/L (ref 3.5–5.1)
Sodium: 139 mmol/L (ref 135–145)
Total Bilirubin: 1 mg/dL (ref 0.3–1.2)
Total Protein: 5.1 g/dL — ABNORMAL LOW (ref 6.5–8.1)

## 2020-05-14 LAB — MAGNESIUM: Magnesium: 1.8 mg/dL (ref 1.7–2.4)

## 2020-05-14 MED ORDER — ENSURE MAX PROTEIN PO LIQD: 11.0000 [oz_av] | Freq: Two times a day (BID) | ORAL | Status: DC

## 2020-05-14 NOTE — Anesthesia Postprocedure Evaluation (Signed)
Anesthesia Post Note  Patient: Jose Castro  Procedure(s) Performed: ENDOSCOPIC RETROGRADE CHOLANGIOPANCREATOGRAPHY (ERCP) WITH PROPOFOL (N/A )  Patient location during evaluation: Endoscopy Anesthesia Type: General Level of consciousness: awake and alert and oriented Pain management: pain level controlled Vital Signs Assessment: post-procedure vital signs reviewed and stable Respiratory status: spontaneous breathing, nonlabored ventilation and respiratory function stable Cardiovascular status: blood pressure returned to baseline and stable Postop Assessment: no signs of nausea or vomiting Anesthetic complications: no   No complications documented.   Last Vitals:  Vitals:   05/13/20 2042 05/14/20 0456  BP: 131/77 (!) 125/56  Pulse: 60 (!) 59  Resp: 20 16  Temp: 36.7 C 36.6 C  SpO2: 98% 95%    Last Pain:  Vitals:   05/14/20 0726  TempSrc:   PainSc: 0-No pain                 Joleen Stuckert

## 2020-05-14 NOTE — Discharge Summary (Signed)
Physician Discharge Summary   DIETER HANE  male DOB: Aug 08, 1926  SEG:[id number]  PCP: Steele Sizer, MD  Admit date: 05/02/2020 Discharge date: 05/14/2020  Admitted From: home Disposition:  SNF CODE STATUS: Full code   Hospital Course:  For full details, please see H&P, progress notes, consult notes and ancillary notes.  Briefly,  [name] a89 y.o.Caucasian malewith a known history ofhypertension, BPH, CVA, and osteoarthritis who was directly admitted for epigastric pain and nausea with dry heaves.  # Acute cholecystitis s/p cholecystostomy tube # Choledocholithiasis s/p ERCP  CT abdomen on presentation showed gallbladder distention and thickening with probable small layering of stones.  Findings were concerning for cholecystitis.  CMP with transaminitis and elevated T bili, concerning for obstructive jaundice.  GI was also consulted for ERCP but patient has to be off from Eliquis for more than 48 hours for procedure.  GenSurg was consulted and deferred surgery.  Cholecystostomy tube was placed by IR instead.  Pt was started on cefepime and flagyl and then transitioned to Vanc and flagyl based on sensitivities of bile cultures which were positive for Enterococcus faecalis and Enterococcus faecium (pt reported penicillin allergy experienced when he was in the [employer]).  IR performed cholangiogram on 6/8, which showed "Nonocclusive filling defect within the distal aspect of the CBD worrisome for nonocclusive choledocholithiasis."  ERCP performed on 6/11 with 1 stone removed.  Pt's LFT and bili have all normalized.  Pt's abx was switched to IV unasyn on 6/11 to finish a 10-day course.  Pt tolerated Unasyn well, so unlikely to have real serious penicillin allergy.  Per Dr Margaretmary Dys (IR), chole tube will stay in place for one month (from placement) and then repeat cholangiogram.  Gen surg will see pt about three weeks after discharge and arrange for that study.  Chole tube needs to  be flushed TID.  Nausea and vomiting 2/2 ileus, resolved Abdominal x-ray with normal bowel and distended stomach.  NG tube was placed by surgery with good output.  After pt passed NG tube clamping trial, NG tube was removed.  Pt was tolerating soft diet prior to discharge.  Electrolyte abnormalities Recurrent hypokalemia and hypomagnesemia Repleted electrolytes as needed.  AKI, resolved Cr peaked at 2.19, likely due to combination of dehydration, acute infection and contrast.  Creatinine started trending down with IVF and treatment of infection.  Renal ultrasound without any hydronephrosis.  After acute illness resolved, pt was taking in oral adequately.  Cr 1.12 on the day of discharge.  Chronic atrial fibrillation, status post pacemaker placement. Continued Toprol-XL.  Home Eliquis resumed after procedures.  Dyslipidemia  Home Crestor held during hospitalization due to elevated LFTs, but resumed at discharge.  BPH continued finasteride  History of gout. No acute flareup.   Discharge Diagnoses:  Active Problems:   Acute cholecystitis   Calculus of bile duct with cholecystitis without obstruction   Abnormal findings on imaging of biliary tract    Discharge Instructions:  Allergies as of 05/14/2020      Reactions   Penicillins Itching, Swelling   Pt reported arm rash and swelling with penicillin administration while he was in the [employer].  However, pt was able to tolerate IV Unasyn without any problem, so penicillin allergy unlikely to be real or serious.  05/14/2020.      Medication List    TAKE these medications   acetaminophen 325 MG tablet Commonly known as: TYLENOL Take 2 tablets (650 mg total) by mouth every 4 (four) hours as needed for  mild pain (or temp > 37.5 C (99.5 F)). What changed: when to take this   apixaban 2.5 MG Tabs tablet Commonly known as: ELIQUIS Take 1 tablet (2.5 mg total) by mouth 2 (two) times daily.   Ensure Max Protein Liqd Take  330 mLs (11 oz total) by mouth 2 (two) times daily.   finasteride 5 MG tablet Commonly known as: PROSCAR Take 1 tablet (5 mg total) by mouth daily.   Fish Oil 1000 MG Caps Take 1,000 mg by mouth daily.   metoprolol succinate 25 MG 24 hr tablet Commonly known as: Toprol XL Take 1 tablet (25 mg total) by mouth daily.   ondansetron 4 MG tablet Commonly known as: Zofran Take 1 tablet (4 mg total) by mouth every 8 (eight) hours as needed for nausea or vomiting.   pantoprazole 40 MG tablet Commonly known as: PROTONIX Take 1 tablet (40 mg total) by mouth daily.   rosuvastatin 40 MG tablet Commonly known as: CRESTOR Take 1 tablet (40 mg total) by mouth daily at 6 PM.   Vitamin D3 50 MCG (2000 UT) Tabs Take 2,000 Units by mouth daily.        Contact information for follow-up providers    Byrnett, Forest Gleason, MD Follow up in 3 week(s).   Specialties: General Surgery, Radiology Contact information: [place] [address] [phone]        Steele Sizer, MD. Schedule an appointment as soon as possible for a visit in 1 week(s).   Specialty: Family Medicine Contact information: [address] [place] [address] [phone]            Contact information for after-discharge care    Destination    [facility] .   Service: Skilled Nursing Contact information: [place] [address] [phone]                  Allergies  Allergen Reactions  . Penicillins Itching and Swelling    Pt reported arm rash and swelling with penicillin administration while he was in the [employer].  However, pt was able to tolerate IV Unasyn without any problem, so penicillin allergy unlikely to be real or serious.  05/14/2020.     The results of significant diagnostics from this hospitalization (including imaging, microbiology, ancillary and laboratory) are listed below for reference.    Consultations:   Procedures/Studies: DG Chest 2 View  Result Date: 05/01/2020 CLINICAL DATA:  Central chest pain.  Nausea. EXAM: CHEST - 2 VIEW COMPARISON:  06/01/2019 FINDINGS: Moderate thoracic spondylosis. Pacer with leads at right atrium and right ventricle. No lead discontinuity. Midline trachea. Normal heart size. Atherosclerosis in the transverse aorta. Tortuous thoracic aorta. No pleural effusion or pneumothorax. Clear lungs. IMPRESSION: No acute cardiopulmonary disease. Aortic Atherosclerosis (ICD10-I70.0). Electronically Signed   By: Abigail Miyamoto M.D.   On: 05/01/2020 17:34   CT Abdomen Pelvis W Contrast  Result Date: 05/02/2020 CLINICAL DATA:  Nausea, vomiting, abdominal pain. EXAM: CT ABDOMEN AND PELVIS WITH CONTRAST TECHNIQUE: Multidetector CT imaging of the abdomen and pelvis was performed using the standard protocol following bolus administration of intravenous contrast. CONTRAST:  59mL OMNIPAQUE IOHEXOL 300 MG/ML  SOLN COMPARISON:  07/07/2018 FINDINGS: Lower chest: Pacer wires noted in the right heart. No acute abnormality. Hepatobiliary: Gallbladder is distended with mild wall thickening. Appearance is concerning for possible cholecystitis. Suspect small layering stones. No biliary ductal dilatation. Diffuse fatty infiltration of the liver  without focal hepatic abnormality. Pancreas: No focal abnormality or ductal dilatation. Spleen: No focal abnormality.  Normal size. Adrenals/Urinary Tract: Numerous bilateral renal cysts. No hydronephrosis. Adrenal glands unremarkable. Urinary bladder is trabeculated with numerous diverticula. Stomach/Bowel: Large stool burden in the rectosigmoid colon. Left colonic diverticulosis. No active diverticulitis. Stomach and small bowel decompressed, unremarkable. Vascular/Lymphatic: Aortic atherosclerosis. No aneurysm or adenopathy. Reproductive: Prostate enlargement with calcifications. Probable radiation seeds in the prostate. Other: No free fluid or free  air. Musculoskeletal: Diffuse degenerative disc and facet disease. No acute bony abnormality or focal bone lesion. IMPRESSION: Gallbladder is distended with mild apparent wall thickening. Probable small layering stones. Findings concerning for cholecystitis. This could be further evaluated with right upper quadrant ultrasound. Fatty infiltration of the liver. Prostate enlargement with trabeculated bladder and numerous bladder wall diverticula. No hydronephrosis. Aortic atherosclerosis. Electronically Signed   By: Rolm Baptise M.D.   On: 05/02/2020 15:29   US RENAL  Result Date: 05/05/2020 CLINICAL DATA:  Acute renal failure EXAM: RENAL / URINARY TRACT ULTRASOUND COMPLETE COMPARISON:  CT abdomen pelvis 05/02/2020 FINDINGS: Right Kidney: Renal measurements: 12.4 x 7.5 x 6.1 cm = volume: 299 mL. Normal renal cortical thickness. Increased renal cortical echogenicity. No hydronephrosis. Multiple cysts within the right kidney measuring up to 4 cm. Left Kidney: Renal measurements: 11.9 x 6.8 x 5.1 cm = volume: 219 mL. Normal renal cortical thickness. Increased renal cortical echogenicity. No hydronephrosis. Cyst within the left kidney measuring up to 1.5 cm. Bladder: Appears normal for degree of bladder distention. Other: None. IMPRESSION: No hydronephrosis. Increased renal cortical echogenicity as can be seen with chronic medical renal disease. Electronically Signed   By: Lovey Newcomer M.D.   On: 05/05/2020 14:30   DG Abd 2 Views  Result Date: 05/06/2020 CLINICAL DATA:  Cholecystitis, post percutaneous cholecystostomy tube placement EXAM: ABDOMEN - 2 VIEW COMPARISON:  05/04/2020 and previous FINDINGS: Right upper quadrant cholecystostomy catheter projects in expected location. There is gas and fluid distention of the stomach. The small bowel is nondilated. Residual oral contrast material in the decompressed colon. Splenic arterial calcifications. Multilevel thoracolumbar spondylitic changes. Transvenous pacing leads  partially visualized. Metallic seeds in the region of the prostate. IMPRESSION: Fluid and gas distention of the stomach. Electronically Signed   By: Lucrezia Europe M.D.   On: 05/06/2020 13:14   DG ABD ACUTE 2+V W 1V CHEST  Result Date: 05/08/2020 CLINICAL DATA:  Ileus, NG tube placement EXAM: DG ABDOMEN ACUTE W/ 1V CHEST COMPARISON:  05/06/2020 FINDINGS: NG tube is in the stomach. Oral contrast material seen within the colon. No evidence of bowel dilatation. Right upper quadrant drainage catheter in place, likely cholecystostomy tube. No organomegaly or free air. Mild elevation of the right hemidiaphragm. Right base atelectasis. Left lung clear. Heart is normal size. Left pacer in place. IMPRESSION: NG tube tip in the stomach.  No evidence of obstruction or ileus. Mild elevation of the right hemidiaphragm with right base atelectasis. Electronically Signed   By: Rolm Baptise M.D.   On: 05/08/2020 11:43   DG Abd Portable 1V  Result Date: 05/06/2020 CLINICAL DATA:  Status post nasogastric tube placement. EXAM: PORTABLE ABDOMEN - 1 VIEW COMPARISON:  May 06, 2020 (8:50 p.m.) FINDINGS: A nasogastric tube is seen with its distal tip overlying the body of the stomach. A moderate amount of air is seen within the gastric lumen. The bowel gas pattern is otherwise normal. Radiopaque contrast is seen throughout the large bowel. A percutaneous cholecystostomy tube is again seen  overlying the lateral aspect of the right upper quadrant. No radio-opaque calculi or other significant radiographic abnormality are seen. IMPRESSION: Nasogastric tube positioning, as described above. Electronically Signed   By: Virgina Norfolk M.D.   On: 05/06/2020 22:28   DG Abd Portable 1V  Result Date: 05/06/2020 CLINICAL DATA:  Enteric catheter placement EXAM: PORTABLE ABDOMEN - 1 VIEW COMPARISON:  05/06/2020 at 10:51 a.m. FINDINGS: Frontal view of the lower chest and upper abdomen demonstrates dual lead pacemaker unchanged. Enteric catheter tip  and side port project over the gastric fundus. Bowel gas pattern is unremarkable. Percutaneous cholecystostomy tube right upper quadrant. IMPRESSION: 1. Enteric catheter projecting over gastric fundus. 2. Stable percutaneous cholecystostomy tube. Electronically Signed   By: Randa Ngo M.D.   On: 05/06/2020 22:20   DG C-Arm 1-60 Min-No Report  Result Date: 05/11/2020 Fluoroscopy was utilized by the requesting physician.  No radiographic interpretation.   DG CHOLANGIOGRAM  EXISTING TUBE  Result Date: 05/08/2020 INDICATION: History of acute cholecystitis, post image guided placement of cholecystostomy tube 05/03/2020, subsequently exchanged on 05/04/2020, due to device malfunction. Given persistently elevated LFTs, request made for performance of a percutaneous cholangiogram via the existing cholecystostomy tube to evaluate for the presence of choledocholithiasis. EXAM: FLUOROSCOPIC GUIDED CHOLECYSTOSTOMY TUBE INJECTION COMPARISON:  CT abdomen pelvis-05/02/2020; CT-guided cholecystostomy tube placement-05/03/2020; fluoroscopic guided cholecystostomy tube exchange-05/04/2020 MEDICATIONS: None ANESTHESIA/SEDATION: None CONTRAST:  25 cc Omnipaque 300-administered into the gallbladder lumen via the existing cholecystostomy tube. FLUOROSCOPY TIME:  1 minutes, 36 seconds COMPLICATIONS: None immediate. PROCEDURE: The patient was positioned supine on the fluoroscopy table. A preprocedural spot fluoroscopic image was obtained of the right upper abdominal quadrant existing cholecystostomy tube. Multiple spot fluoroscopic radiographic images were obtained of the right upper abdominal quadrant and existing cholecystostomy tube following injection of a small amount of contrast. Images were reviewed and discussed with the patient. The cholecystostomy tube was flushed with a small amount of saline and reconnected to a gravity bag. A dressing was placed. The patient tolerated the procedure well without immediate  postprocedural complication. FINDINGS: Preprocedural spot fluoroscopic image of the right upper abdominal quadrant demonstrates grossly unchanged positioning of the cholecystostomy tube with end coiled and locked overlying the expected location of the gallbladder fundus. Subsequent contrast injection demonstrates appropriate functionality of the cholecystostomy tube with brisk opacification of the gallbladder. There are no discrete persistent filling defects within the gallbladder lumen to suggest the presence of cholelithiasis. There is passage of contrast from the gallbladder through the cystic and common bile ducts, ultimately with faint opacification of the duodenum. There is a persistent nonocclusive filling defect within the distal aspect of the CBD worrisome for nonocclusive choledocholithiasis IMPRESSION: 1. Appropriately positioned and functioning cholecystostomy tube. No exchange performed. 2. Nonocclusive filling defect within the distal aspect of the CBD worrisome for nonocclusive choledocholithiasis. PLAN: - Further evaluation and management with ERCP could be performed as clinically indicated. - Alternatively, could consider repeat cholangiogram (with potential cholecystostomy tube capping trial) in 1 month to evaluate for spontaneous passage of the tiny suspected choledocholithiasis. Electronically Signed   By: Sandi Mariscal M.D.   On: 05/08/2020 16:35   IR EXCHANGE BILIARY DRAIN  Result Date: 05/04/2020 INDICATION: Status post placement of percutaneous cholecystostomy tube yesterday to treat acute cholecystitis. The tube has become disrupted with separation of the drainage catheter from its external hub outside of the patient's body. The catheter requires exchange. EXAM: PERCUTANEOUS CHOLECYSTOSTOMY TUBE EXCHANGE MEDICATIONS: None ANESTHESIA/SEDATION: None FLUOROSCOPY TIME:  Fluoroscopy Time: 42 seconds.  7.3  mGy. CONTRAST:  7 mL Visipaque [id number] COMPLICATIONS: None immediate. PROCEDURE: Informed  written consent was obtained from the patient after a thorough discussion of the procedural risks, benefits and alternatives. All questions were addressed. Maximal Sterile Barrier Technique was utilized including caps, mask, sterile gowns, sterile gloves, sterile drape, hand hygiene and skin antiseptic. A timeout was performed prior to the initiation of the procedure. The pre-existing cholecystostomy tube was cut, injected with contrast and removed over a guidewire. A new 10 French drainage catheter was then advanced over the wire and formed. Positioning of the catheter was confirmed by fluoroscopy after contrast injection. The catheter was then connected to a new gravity drainage bag and secured at the skin with a Prolene retention suture. FINDINGS: The new cholecystostomy tube was formed in the gallbladder lumen with return of bile. IMPRESSION: Exchange of 10 French cholecystostomy tube due to disruption of the previously placed catheter. A new 10 French catheter was placed and positioned in the gallbladder lumen. This was attached to gravity bag drainage. Electronically Signed   By: Aletta Edouard M.D.   On: 05/04/2020 16:31   CT IMAGE GUIDED DRAINAGE BY PERCUTANEOUS CATHETER  Result Date: 05/03/2020 INDICATION: Acute cholecystitis and need for percutaneous cholecystostomy as the patient is currently not a candidate for cholecystectomy. EXAM: CT GUIDED CHOLECYSTOSTOMY TUBE PLACEMENT MEDICATIONS: No additional medications. ANESTHESIA/SEDATION: Moderate (conscious) sedation was employed during this procedure. A total of Versed 0.5 mg and Fentanyl 25 mcg was administered intravenously. Moderate Sedation Time: 12 minutes. The patient's level of consciousness and vital signs were monitored continuously by radiology nursing throughout the procedure under my direct supervision. FLUOROSCOPY TIME:  The procedure was performed under CT guidance. COMPLICATIONS: None immediate. PROCEDURE: Informed written consent was  obtained from the patient after a thorough discussion of the procedural risks, benefits and alternatives. All questions were addressed. Maximal Sterile Barrier Technique was utilized including caps, mask, sterile gowns, sterile gloves, sterile drape, hand hygiene and skin antiseptic. A timeout was performed prior to the initiation of the procedure. CT was performed through the upper to mid abdomen in a supine position. Under CT guidance, an 18 gauge trocar needle was advanced into the gallbladder lumen. Aspiration of bile was performed. A guidewire was advanced through the needle and the needle removed. The tract was dilated over the guidewire and a 10 French percutaneous drainage catheter advanced into the gallbladder lumen. Catheter position was confirmed by CT. Additional bile sample was aspirated and sent for culture analysis. The catheter was connected to a gravity drainage bag and secured at the skin with a Prolene retention suture and StatLock device. FINDINGS: The gallbladder shows significant distension and there is increased prominence of inflammation around the gallbladder and gallbladder wall edema compared to the CT study yesterday. The cholecystostomy tube is draining dark bile after placement. IMPRESSION: CT-guided placement of percutaneous cholecystostomy tube. A 10 French drain was placed into the gallbladder lumen and attached to gravity bag drainage. A sample of bile aspirated directly from the gallbladder was sent for culture analysis. Electronically Signed   By: Aletta Edouard M.D.   On: 05/03/2020 13:37   US Abdomen Limited RUQ  Result Date: 05/03/2020 CLINICAL DATA:  [place] EXAM: ULTRASOUND ABDOMEN LIMITED RIGHT UPPER QUADRANT COMPARISON:  Abdominal CT yesterday. FINDINGS: Gallbladder: Prominently distended spanning greater than 12 cm in length and 7 cm in transverse dimension. Mild wall thickening at 3.3 mm. No visualized gallstones or intraluminal sludge. No sonographic Murphy sign  noted by sonographer. Common bile duct:  Diameter: 5 mm, normal. Liver: No focal lesion identified. Heterogeneous in parenchymal echogenicity. Portal vein is patent on color Doppler imaging with normal direction of blood flow towards the liver. Other: Small amount of perihepatic/pericholecystic ascites/free fluid. IMPRESSION: 1. Distended gallbladder with mild wall thickening. No visualized gallstones. 2. No biliary dilatation. 3. Small amount of right upper quadrant free fluid/ascites. Electronically Signed   By: Keith Rake M.D.   On: 05/03/2020 11:48      Labs: BNP (last 3 results) Recent Labs    05/21/19 1735  BNP 585.2*   Basic Metabolic Panel: Recent Labs  Lab 05/10/20 0429 05/11/20 0524 05/12/20 0537 05/13/20 0624 05/14/20 0632  NA 139 136 137 136 139  K 2.4* 3.1* 3.6 3.5 3.6  CL 106 105 107 106 107  CO2 26 23 22 25 25   GLUCOSE 115* 120* 116* 98 103*  BUN 19 14 14 18 23   CREATININE 1.25* 1.13 1.17 1.04 1.12  CALCIUM 7.4* 7.2* 7.7* 7.8* 8.1*  MG 1.4* 1.6* 1.8 1.5* 1.8   Liver Function Tests: Recent Labs  Lab 05/10/20 0429 05/11/20 0524 05/12/20 0537 05/13/20 0624 05/14/20 0632  AST 18 17 17 15  13*  ALT 42 31 25 20 18   ALKPHOS 93 84 85 80 84  BILITOT 1.7* 1.4* 1.2 1.2 1.0  PROT 4.7* 4.8* 4.7* 4.8* 5.1*  ALBUMIN 2.2* 2.2* 2.2* 2.2* 2.4*   No results for input(s): LIPASE, AMYLASE in the last 168 hours. No results for input(s): AMMONIA in the last 168 hours. CBC: Recent Labs  Lab 05/10/20 0429 05/11/20 0524 05/12/20 0537 05/13/20 0624 05/14/20 0632  WBC 9.4 10.5 10.3 12.9* 12.0*  HGB 13.5 13.3 13.2 13.1 13.5  HCT 38.9* 37.8* 39.0 37.7* 38.1*  MCV 89.6 88.5 92.0 89.5 89.6  PLT 231 263 279 291 331   Cardiac Enzymes: No results for input(s): CKTOTAL, CKMB, CKMBINDEX, TROPONINI in the last 168 hours. BNP: Invalid input(s): POCBNP CBG: No results for input(s): GLUCAP in the last 168 hours. D-Dimer No results for input(s): DDIMER in the last 72  hours. Hgb A1c No results for input(s): HGBA1C in the last 72 hours. Lipid Profile No results for input(s): CHOL, HDL, LDLCALC, TRIG, CHOLHDL, LDLDIRECT in the last 72 hours. Thyroid function studies No results for input(s): TSH, T4TOTAL, T3FREE, THYROIDAB in the last 72 hours.  Invalid input(s): FREET3 Anemia work up No results for input(s): VITAMINB12, FOLATE, FERRITIN, TIBC, IRON, RETICCTPCT in the last 72 hours. Urinalysis    Component Value Date/Time   COLORURINE YELLOW 06/01/2019 1231   APPEARANCEUR CLEAR 06/01/2019 1231   APPEARANCEUR Clear 11/26/2013 0239   LABSPEC 1.016 06/01/2019 1231   LABSPEC 1.023 11/26/2013 0239   PHURINE 6.0 06/01/2019 1231   GLUCOSEU NEGATIVE 06/01/2019 1231   GLUCOSEU Negative 11/26/2013 0239   HGBUR NEGATIVE 06/01/2019 1231   BILIRUBINUR NEGATIVE 06/01/2019 1231   BILIRUBINUR Negative 07/16/2018 1455   BILIRUBINUR Negative 11/26/2013 0239   KETONESUR NEGATIVE 06/01/2019 1231   PROTEINUR 30 (A) 06/01/2019 1231   UROBILINOGEN 0.2 07/16/2018 1455   NITRITE NEGATIVE 06/01/2019 1231   LEUKOCYTESUR NEGATIVE 06/01/2019 1231   LEUKOCYTESUR Negative 11/26/2013 0239   Sepsis Labs Invalid input(s): PROCALCITONIN,  WBC,  LACTICIDVEN Microbiology Recent Results (from the past 240 hour(s))  Culture, blood (routine x 2)     Status: None   Collection Time: 05/08/20  6:42 PM   Specimen: BLOOD  Result Value Ref Range Status   Specimen Description BLOOD LEFT ANTECUBITAL  Final   Special Requests  Final    BOTTLES DRAWN AEROBIC AND ANAEROBIC Blood Culture adequate volume   Culture   Final    NO GROWTH 5 DAYS Performed at [facility], [address] [place], [place] [address]    Report Status 05/13/2020 FINAL  Final  Culture, blood (routine x 2)     Status: None   Collection Time: 05/08/20  6:42 PM   Specimen: BLOOD  Result Value Ref Range Status   Specimen Description BLOOD BLOOD LEFT HAND  Final   Special Requests   Final    BOTTLES  DRAWN AEROBIC ONLY Blood Culture results may not be optimal due to an inadequate volume of blood received in culture bottles   Culture   Final    NO GROWTH 5 DAYS Performed at [facility], [address] [place], [place] [address]    Report Status 05/13/2020 FINAL  Final     Total time spend on discharging this patient, including the last patient exam, discussing the hospital stay, instructions for ongoing care as it relates to all pertinent caregivers, as well as preparing the medical discharge records, prescriptions, and/or referrals as applicable, is 35 minutes.    Enzo Bi, MD  [facility] 05/14/2020, 12:54 PM  If 7PM-7AM, please contact night-coverage

## 2020-05-14 NOTE — Progress Notes (Signed)
RN called report to [facility]. Report given to Silverio Decamp RN.

## 2020-05-14 NOTE — Care Management Important Message (Signed)
Important Message  Patient Details  Name: Jose Castro MRN: [id number] Date of Birth: 1926/06/27   Medicare Important Message Given:  Yes     Dannette Barbara 05/14/2020, 12:10 PM

## 2020-05-14 NOTE — TOC Transition Note (Signed)
Transition of Care [facility]) - CM/SW Discharge Note   Patient Details  Name: Jose Castro MRN: [id number] Date of Birth: 26-Apr-1926  Transition of Care [facility]) CM/SW Contact:  Candie Chroman, LCSW Phone Number: 05/14/2020, 1:03 PM   Clinical Narrative: Patient has orders to discharge to [facility] today. RN will call report to [phone] (Room 212). EMS transport has been set up for 2:00. No further concerns. CSW signing off.    Final next level of care: Skilled Nursing Facility Barriers to Discharge: Barriers Resolved   Patient Goals and CMS Choice   CMS Medicare.gov Compare Post Acute Care list provided to:: Patient (Daughter at bedside.) Choice offered to / list presented to : Patient, Adult Children  Discharge Placement   Existing PASRR number confirmed : 05/11/20          Patient [facility] bed at: [facility] Patient to be transferred to facility by: EMS Name of family member notified: Daughter at bedside [name]?) Patient and family notified of of transfer: 05/14/20  Discharge Plan and Services     Post Acute Care Choice: Home Health                               Social Determinants of Health (SDOH) Interventions     Readmission Risk Interventions No flowsheet data found.

## 2020-05-14 NOTE — Progress Notes (Signed)
Jose Castro  A and O x 4. VSS. Pt tolerating diet well. No complaints of pain or nausea. IV removed intact, prescriptions given. Pt voiced understanding of discharge instructions with no further questions. Pt discharged via EMS.    Allergies as of 05/14/2020      Reactions   Penicillins Itching, Swelling   Pt reported arm rash and swelling with penicillin administration while he was in the [employer].  However, pt was able to tolerate IV Unasyn without any problem, so penicillin allergy unlikely to be real or serious.  05/14/2020.      Medication List    TAKE these medications   acetaminophen 325 MG tablet Commonly known as: TYLENOL Take 2 tablets (650 mg total) by mouth every 4 (four) hours as needed for mild pain (or temp > 37.5 C (99.5 F)). What changed: when to take this   apixaban 2.5 MG Tabs tablet Commonly known as: ELIQUIS Take 1 tablet (2.5 mg total) by mouth 2 (two) times daily.   Ensure Max Protein Liqd Take 330 mLs (11 oz total) by mouth 2 (two) times daily.   finasteride 5 MG tablet Commonly known as: PROSCAR Take 1 tablet (5 mg total) by mouth daily.   Fish Oil 1000 MG Caps Take 1,000 mg by mouth daily.   metoprolol succinate 25 MG 24 hr tablet Commonly known as: Toprol XL Take 1 tablet (25 mg total) by mouth daily.   ondansetron 4 MG tablet Commonly known as: Zofran Take 1 tablet (4 mg total) by mouth every 8 (eight) hours as needed for nausea or vomiting.   pantoprazole 40 MG tablet Commonly known as: PROTONIX Take 1 tablet (40 mg total) by mouth daily.   rosuvastatin 40 MG tablet Commonly known as: CRESTOR Take 1 tablet (40 mg total) by mouth daily at 6 PM.   Vitamin D3 50 MCG (2000 UT) Tabs Take 2,000 Units by mouth daily.       Vitals:   05/14/20 0456 05/14/20 1238  BP: (!) 125/56 126/71  Pulse: (!) 59 (!) 59  Resp: 16 15  Temp: 97.9 F (36.6 C) 98.2 F (36.8 C)  SpO2: 95% 95%    Francesco Sor

## 2020-05-14 NOTE — TOC Progression Note (Addendum)
Transition of Care [facility]) - Progression Note    Patient Details  Name: Jose Castro MRN: [id number] Date of Birth: 02/24/1926  Transition of Care [facility]) CM/SW [place], LCSW Phone Number: 05/14/2020, 8:36 AM  Clinical Narrative: Insurance authorization still pending. Uploaded yesterday's PT note into [facility] portal.    11:38 am: CSW called [facility]. Insurance authorization is still pending and in clinician's queue. Updated daughter.  12:33 pm: Auth approved: [id number]. SNF admissions coordinator, MD, patient, and daughter at bedside aware.  Expected Discharge Plan: [place] Barriers to Discharge: Continued Medical Work up  Expected Discharge Plan and Services Expected Discharge Plan: [place] Choice: [place] arrangements for the past 2 months: Single Family Home                                       Social Determinants of Health (SDOH) Interventions    Readmission Risk Interventions No flowsheet data found.

## 2020-05-14 NOTE — Progress Notes (Deleted)
Physician Discharge Summary   Jose Castro  male DOB: [birth year]  MCN:[id number]  PCP: Steele Sizer, MD  Admit date: 05/02/2020 Discharge date: 05/14/2020  Admitted From: home Disposition:  SNF CODE STATUS: Full code   Hospital Course:  For full details, please see H&P, progress notes, consult notes and ancillary notes.  Briefly,  [name] a89 y.o.Caucasian malewith a known history ofhypertension, BPH, CVA, and osteoarthritis who was directly admitted for epigastric pain and nausea with dry heaves.  # Acute cholecystitis s/p cholecystostomy tube # Choledocholithiasis s/p ERCP  CT abdomen on presentation showed gallbladder distention and thickening with probable small layering of stones.  Findings were concerning for cholecystitis.  CMP with transaminitis and elevated T bili, concerning for obstructive jaundice.  GI was also consulted for ERCP but patient has to be off from Eliquis for more than 48 hours for procedure.  GenSurg was consulted and deferred surgery.  Cholecystostomy tube was placed by IR instead.  Pt was started on cefepime and flagyl and then transitioned to Vanc and flagyl based on sensitivities of bile cultures which were positive for Enterococcus faecalis and Enterococcus faecium (pt reported penicillin allergy experienced when he was in the [employer]).  IR performed cholangiogram on 6/8, which showed "Nonocclusive filling defect within the distal aspect of the CBD worrisome for nonocclusive choledocholithiasis."  ERCP performed on 6/11 with 1 stone removed.  Pt's LFT and bili have all normalized.  Pt's abx was switched to IV unasyn on 6/11 to finish a 10-day course.  Pt tolerated Unasyn well, so unlikely to have real serious penicillin allergy.  Per Dr Margaretmary Dys (IR), chole tube will stay in place for one month (from placement) and then repeat cholangiogram.  Gen surg will see pt about three weeks after discharge and arrange for that study.  Chole tube needs to  be flushed TID.  Nausea and vomiting 2/2 ileus, resolved Abdominal x-ray with normal bowel and distended stomach.  NG tube was placed by surgery with good output.  After pt passed NG tube clamping trial, NG tube was removed.  Pt was tolerating soft diet prior to discharge.  Electrolyte abnormalities Recurrent hypokalemia and hypomagnesemia Repleted electrolytes as needed.  AKI, resolved Cr peaked at 2.19, likely due to combination of dehydration, acute infection and contrast.  Creatinine started trending down with IVF and treatment of infection.  Renal ultrasound without any hydronephrosis.  After acute illness resolved, pt was taking in oral adequately.  Cr 1.12 on the day of discharge.  Chronic atrial fibrillation, status post pacemaker placement. Continued Toprol-XL.  Home Eliquis resumed after procedures.  Dyslipidemia  Home Crestor held during hospitalization due to elevated LFTs, but resumed at discharge.  BPH continued finasteride  History of gout. No acute flareup.   Discharge Diagnoses:  Active Problems:   Acute cholecystitis   Calculus of bile duct with cholecystitis without obstruction   Abnormal findings on imaging of biliary tract    Discharge Instructions:  Allergies as of 05/14/2020      Reactions   Penicillins Itching, Swelling   Has patient had a PCN reaction causing immediate rash, facial/tongue/throat swelling, SOB or lightheadedness with hypotension: No Has patient had a PCN reaction causing severe rash involving mucus membranes or skin necrosis: No Has patient had a PCN reaction that required hospitalization: No Has patient had a PCN reaction occurring within the last 10 years: No If all of the above answers are "NO", then may proceed with Cephalosporin use.      Medication  List    TAKE these medications   acetaminophen 325 MG tablet Commonly known as: TYLENOL Take 2 tablets (650 mg total) by mouth every 4 (four) hours as needed for mild  pain (or temp > 37.5 C (99.5 F)). What changed: when to take this   apixaban 2.5 MG Tabs tablet Commonly known as: ELIQUIS Take 1 tablet (2.5 mg total) by mouth 2 (two) times daily.   Ensure Max Protein Liqd Take 330 mLs (11 oz total) by mouth 2 (two) times daily.   finasteride 5 MG tablet Commonly known as: PROSCAR Take 1 tablet (5 mg total) by mouth daily.   Fish Oil 1000 MG Caps Take 1,000 mg by mouth daily.   metoprolol succinate 25 MG 24 hr tablet Commonly known as: Toprol XL Take 1 tablet (25 mg total) by mouth daily.   ondansetron 4 MG tablet Commonly known as: Zofran Take 1 tablet (4 mg total) by mouth every 8 (eight) hours as needed for nausea or vomiting.   pantoprazole 40 MG tablet Commonly known as: PROTONIX Take 1 tablet (40 mg total) by mouth daily.   rosuvastatin 40 MG tablet Commonly known as: CRESTOR Take 1 tablet (40 mg total) by mouth daily at 6 PM.   Vitamin D3 50 MCG (2000 UT) Tabs Take 2,000 Units by mouth daily.        Contact information for follow-up providers    Byrnett, Forest Gleason, MD Follow up in 3 week(s).   Specialties: General Surgery, Radiology Contact information: [place] [address] [phone]        Steele Sizer, MD. Schedule an appointment as soon as possible for a visit in 1 week(s).   Specialty: Family Medicine Contact information: [address] [place] [place] [address] [phone]            Contact information for after-discharge care    Destination    [facility] .   Service: Skilled Nursing Contact information: [place] [address] [phone]                  Allergies  Allergen Reactions  . Penicillins Itching and Swelling    Has patient had a PCN reaction causing immediate rash, facial/tongue/throat swelling, SOB or lightheadedness with hypotension: No Has patient had a PCN reaction causing  severe rash involving mucus membranes or skin necrosis: No Has patient had a PCN reaction that required hospitalization: No Has patient had a PCN reaction occurring within the last 10 years: No If all of the above answers are "NO", then may proceed with Cephalosporin use.      The results of significant diagnostics from this hospitalization (including imaging, microbiology, ancillary and laboratory) are listed below for reference.   Consultations:   Procedures/Studies: DG Chest 2 View  Result Date: 05/01/2020 CLINICAL DATA:  Central chest pain.  Nausea. EXAM: CHEST - 2 VIEW COMPARISON:  06/01/2019 FINDINGS: Moderate thoracic spondylosis. Pacer with leads at right atrium and right ventricle. No lead discontinuity. Midline trachea. Normal heart size. Atherosclerosis in the transverse aorta. Tortuous thoracic aorta. No pleural effusion or pneumothorax. Clear lungs. IMPRESSION: No acute cardiopulmonary disease. Aortic Atherosclerosis (ICD10-I70.0). Electronically Signed   By: Abigail Miyamoto M.D.   On: 05/01/2020 17:34   CT Abdomen Pelvis W Contrast  Result Date: 05/02/2020 CLINICAL DATA:  Nausea, vomiting, abdominal pain. EXAM: CT ABDOMEN AND PELVIS WITH CONTRAST TECHNIQUE: Multidetector CT imaging of the abdomen and pelvis  was performed using the standard protocol following bolus administration of intravenous contrast. CONTRAST:  44mL OMNIPAQUE IOHEXOL 300 MG/ML  SOLN COMPARISON:  07/07/2018 FINDINGS: Lower chest: Pacer wires noted in the right heart. No acute abnormality. Hepatobiliary: Gallbladder is distended with mild wall thickening. Appearance is concerning for possible cholecystitis. Suspect small layering stones. No biliary ductal dilatation. Diffuse fatty infiltration of the liver without focal hepatic abnormality. Pancreas: No focal abnormality or ductal dilatation. Spleen: No focal abnormality.  Normal size. Adrenals/Urinary Tract: Numerous bilateral renal cysts. No hydronephrosis. Adrenal  glands unremarkable. Urinary bladder is trabeculated with numerous diverticula. Stomach/Bowel: Large stool burden in the rectosigmoid colon. Left colonic diverticulosis. No active diverticulitis. Stomach and small bowel decompressed, unremarkable. Vascular/Lymphatic: Aortic atherosclerosis. No aneurysm or adenopathy. Reproductive: Prostate enlargement with calcifications. Probable radiation seeds in the prostate. Other: No free fluid or free air. Musculoskeletal: Diffuse degenerative disc and facet disease. No acute bony abnormality or focal bone lesion. IMPRESSION: Gallbladder is distended with mild apparent wall thickening. Probable small layering stones. Findings concerning for cholecystitis. This could be further evaluated with right upper quadrant ultrasound. Fatty infiltration of the liver. Prostate enlargement with trabeculated bladder and numerous bladder wall diverticula. No hydronephrosis. Aortic atherosclerosis. Electronically Signed   By: Rolm Baptise M.D.   On: 05/02/2020 15:29   US RENAL  Result Date: 05/05/2020 CLINICAL DATA:  Acute renal failure EXAM: RENAL / URINARY TRACT ULTRASOUND COMPLETE COMPARISON:  CT abdomen pelvis 05/02/2020 FINDINGS: Right Kidney: Renal measurements: 12.4 x 7.5 x 6.1 cm = volume: 299 mL. Normal renal cortical thickness. Increased renal cortical echogenicity. No hydronephrosis. Multiple cysts within the right kidney measuring up to 4 cm. Left Kidney: Renal measurements: 11.9 x 6.8 x 5.1 cm = volume: 219 mL. Normal renal cortical thickness. Increased renal cortical echogenicity. No hydronephrosis. Cyst within the left kidney measuring up to 1.5 cm. Bladder: Appears normal for degree of bladder distention. Other: None. IMPRESSION: No hydronephrosis. Increased renal cortical echogenicity as can be seen with chronic medical renal disease. Electronically Signed   By: Lovey Newcomer M.D.   On: 05/05/2020 14:30   DG Abd 2 Views  Result Date: 05/06/2020 CLINICAL DATA:   Cholecystitis, post percutaneous cholecystostomy tube placement EXAM: ABDOMEN - 2 VIEW COMPARISON:  05/04/2020 and previous FINDINGS: Right upper quadrant cholecystostomy catheter projects in expected location. There is gas and fluid distention of the stomach. The small bowel is nondilated. Residual oral contrast material in the decompressed colon. Splenic arterial calcifications. Multilevel thoracolumbar spondylitic changes. Transvenous pacing leads partially visualized. Metallic seeds in the region of the prostate. IMPRESSION: Fluid and gas distention of the stomach. Electronically Signed   By: Lucrezia Europe M.D.   On: 05/06/2020 13:14   DG ABD ACUTE 2+V W 1V CHEST  Result Date: 05/08/2020 CLINICAL DATA:  Ileus, NG tube placement EXAM: DG ABDOMEN ACUTE W/ 1V CHEST COMPARISON:  05/06/2020 FINDINGS: NG tube is in the stomach. Oral contrast material seen within the colon. No evidence of bowel dilatation. Right upper quadrant drainage catheter in place, likely cholecystostomy tube. No organomegaly or free air. Mild elevation of the right hemidiaphragm. Right base atelectasis. Left lung clear. Heart is normal size. Left pacer in place. IMPRESSION: NG tube tip in the stomach.  No evidence of obstruction or ileus. Mild elevation of the right hemidiaphragm with right base atelectasis. Electronically Signed   By: Rolm Baptise M.D.   On: 05/08/2020 11:43   DG Abd Portable 1V  Result Date: 05/06/2020 CLINICAL DATA:  Status post  nasogastric tube placement. EXAM: PORTABLE ABDOMEN - 1 VIEW COMPARISON:  May 06, 2020 (8:50 p.m.) FINDINGS: A nasogastric tube is seen with its distal tip overlying the body of the stomach. A moderate amount of air is seen within the gastric lumen. The bowel gas pattern is otherwise normal. Radiopaque contrast is seen throughout the large bowel. A percutaneous cholecystostomy tube is again seen overlying the lateral aspect of the right upper quadrant. No radio-opaque calculi or other significant  radiographic abnormality are seen. IMPRESSION: Nasogastric tube positioning, as described above. Electronically Signed   By: Virgina Norfolk M.D.   On: 05/06/2020 22:28   DG Abd Portable 1V  Result Date: 05/06/2020 CLINICAL DATA:  Enteric catheter placement EXAM: PORTABLE ABDOMEN - 1 VIEW COMPARISON:  05/06/2020 at 10:51 a.m. FINDINGS: Frontal view of the lower chest and upper abdomen demonstrates dual lead pacemaker unchanged. Enteric catheter tip and side port project over the gastric fundus. Bowel gas pattern is unremarkable. Percutaneous cholecystostomy tube right upper quadrant. IMPRESSION: 1. Enteric catheter projecting over gastric fundus. 2. Stable percutaneous cholecystostomy tube. Electronically Signed   By: Randa Ngo M.D.   On: 05/06/2020 22:20   DG C-Arm 1-60 Min-No Report  Result Date: 05/11/2020 Fluoroscopy was utilized by the requesting physician.  No radiographic interpretation.   DG CHOLANGIOGRAM  EXISTING TUBE  Result Date: 05/08/2020 INDICATION: History of acute cholecystitis, post image guided placement of cholecystostomy tube 05/03/2020, subsequently exchanged on 05/04/2020, due to device malfunction. Given persistently elevated LFTs, request made for performance of a percutaneous cholangiogram via the existing cholecystostomy tube to evaluate for the presence of choledocholithiasis. EXAM: FLUOROSCOPIC GUIDED CHOLECYSTOSTOMY TUBE INJECTION COMPARISON:  CT abdomen pelvis-05/02/2020; CT-guided cholecystostomy tube placement-05/03/2020; fluoroscopic guided cholecystostomy tube exchange-05/04/2020 MEDICATIONS: None ANESTHESIA/SEDATION: None CONTRAST:  25 cc Omnipaque 300-administered into the gallbladder lumen via the existing cholecystostomy tube. FLUOROSCOPY TIME:  1 minutes, 36 seconds COMPLICATIONS: None immediate. PROCEDURE: The patient was positioned supine on the fluoroscopy table. A preprocedural spot fluoroscopic image was obtained of the right upper abdominal quadrant  existing cholecystostomy tube. Multiple spot fluoroscopic radiographic images were obtained of the right upper abdominal quadrant and existing cholecystostomy tube following injection of a small amount of contrast. Images were reviewed and discussed with the patient. The cholecystostomy tube was flushed with a small amount of saline and reconnected to a gravity bag. A dressing was placed. The patient tolerated the procedure well without immediate postprocedural complication. FINDINGS: Preprocedural spot fluoroscopic image of the right upper abdominal quadrant demonstrates grossly unchanged positioning of the cholecystostomy tube with end coiled and locked overlying the expected location of the gallbladder fundus. Subsequent contrast injection demonstrates appropriate functionality of the cholecystostomy tube with brisk opacification of the gallbladder. There are no discrete persistent filling defects within the gallbladder lumen to suggest the presence of cholelithiasis. There is passage of contrast from the gallbladder through the cystic and common bile ducts, ultimately with faint opacification of the duodenum. There is a persistent nonocclusive filling defect within the distal aspect of the CBD worrisome for nonocclusive choledocholithiasis IMPRESSION: 1. Appropriately positioned and functioning cholecystostomy tube. No exchange performed. 2. Nonocclusive filling defect within the distal aspect of the CBD worrisome for nonocclusive choledocholithiasis. PLAN: - Further evaluation and management with ERCP could be performed as clinically indicated. - Alternatively, could consider repeat cholangiogram (with potential cholecystostomy tube capping trial) in 1 month to evaluate for spontaneous passage of the tiny suspected choledocholithiasis. Electronically Signed   By: Sandi Mariscal M.D.   On: 05/08/2020 16:35  IR EXCHANGE BILIARY DRAIN  Result Date: 05/04/2020 INDICATION: Status post placement of percutaneous  cholecystostomy tube yesterday to treat acute cholecystitis. The tube has become disrupted with separation of the drainage catheter from its external hub outside of the patient's body. The catheter requires exchange. EXAM: PERCUTANEOUS CHOLECYSTOSTOMY TUBE EXCHANGE MEDICATIONS: None ANESTHESIA/SEDATION: None FLUOROSCOPY TIME:  Fluoroscopy Time: 42 seconds.  7.3 mGy. CONTRAST:  7 mL Visipaque [id number] COMPLICATIONS: None immediate. PROCEDURE: Informed written consent was obtained from the patient after a thorough discussion of the procedural risks, benefits and alternatives. All questions were addressed. Maximal Sterile Barrier Technique was utilized including caps, mask, sterile gowns, sterile gloves, sterile drape, hand hygiene and skin antiseptic. A timeout was performed prior to the initiation of the procedure. The pre-existing cholecystostomy tube was cut, injected with contrast and removed over a guidewire. A new 10 French drainage catheter was then advanced over the wire and formed. Positioning of the catheter was confirmed by fluoroscopy after contrast injection. The catheter was then connected to a new gravity drainage bag and secured at the skin with a Prolene retention suture. FINDINGS: The new cholecystostomy tube was formed in the gallbladder lumen with return of bile. IMPRESSION: Exchange of 10 French cholecystostomy tube due to disruption of the previously placed catheter. A new 10 French catheter was placed and positioned in the gallbladder lumen. This was attached to gravity bag drainage. Electronically Signed   By: Aletta Edouard M.D.   On: 05/04/2020 16:31   CT IMAGE GUIDED DRAINAGE BY PERCUTANEOUS CATHETER  Result Date: 05/03/2020 INDICATION: Acute cholecystitis and need for percutaneous cholecystostomy as the patient is currently not a candidate for cholecystectomy. EXAM: CT GUIDED CHOLECYSTOSTOMY TUBE PLACEMENT MEDICATIONS: No additional medications. ANESTHESIA/SEDATION: Moderate (conscious)  sedation was employed during this procedure. A total of Versed 0.5 mg and Fentanyl 25 mcg was administered intravenously. Moderate Sedation Time: 12 minutes. The patient's level of consciousness and vital signs were monitored continuously by radiology nursing throughout the procedure under my direct supervision. FLUOROSCOPY TIME:  The procedure was performed under CT guidance. COMPLICATIONS: None immediate. PROCEDURE: Informed written consent was obtained from the patient after a thorough discussion of the procedural risks, benefits and alternatives. All questions were addressed. Maximal Sterile Barrier Technique was utilized including caps, mask, sterile gowns, sterile gloves, sterile drape, hand hygiene and skin antiseptic. A timeout was performed prior to the initiation of the procedure. CT was performed through the upper to mid abdomen in a supine position. Under CT guidance, an 18 gauge trocar needle was advanced into the gallbladder lumen. Aspiration of bile was performed. A guidewire was advanced through the needle and the needle removed. The tract was dilated over the guidewire and a 10 French percutaneous drainage catheter advanced into the gallbladder lumen. Catheter position was confirmed by CT. Additional bile sample was aspirated and sent for culture analysis. The catheter was connected to a gravity drainage bag and secured at the skin with a Prolene retention suture and StatLock device. FINDINGS: The gallbladder shows significant distension and there is increased prominence of inflammation around the gallbladder and gallbladder wall edema compared to the CT study yesterday. The cholecystostomy tube is draining dark bile after placement. IMPRESSION: CT-guided placement of percutaneous cholecystostomy tube. A 10 French drain was placed into the gallbladder lumen and attached to gravity bag drainage. A sample of bile aspirated directly from the gallbladder was sent for culture analysis. Electronically  Signed   By: Aletta Edouard M.D.   On: 05/03/2020 13:37  US Abdomen Limited RUQ  Result Date: 05/03/2020 CLINICAL DATA:  [place] EXAM: ULTRASOUND ABDOMEN LIMITED RIGHT UPPER QUADRANT COMPARISON:  Abdominal CT yesterday. FINDINGS: Gallbladder: Prominently distended spanning greater than 12 cm in length and 7 cm in transverse dimension. Mild wall thickening at 3.3 mm. No visualized gallstones or intraluminal sludge. No sonographic Murphy sign noted by sonographer. Common bile duct: Diameter: 5 mm, normal. Liver: No focal lesion identified. Heterogeneous in parenchymal echogenicity. Portal vein is patent on color Doppler imaging with normal direction of blood flow towards the liver. Other: Small amount of perihepatic/pericholecystic ascites/free fluid. IMPRESSION: 1. Distended gallbladder with mild wall thickening. No visualized gallstones. 2. No biliary dilatation. 3. Small amount of right upper quadrant free fluid/ascites. Electronically Signed   By: Keith Rake M.D.   On: 05/03/2020 11:48      Labs: BNP (last 3 results) Recent Labs    05/21/19 1735  BNP 811.5*   Basic Metabolic Panel: Recent Labs  Lab 05/10/20 0429 05/11/20 0524 05/12/20 0537 05/13/20 0624 05/14/20 0632  NA 139 136 137 136 139  K 2.4* 3.1* 3.6 3.5 3.6  CL 106 105 107 106 107  CO2 26 23 22 25 25   GLUCOSE 115* 120* 116* 98 103*  BUN 19 14 14 18 23   CREATININE 1.25* 1.13 1.17 1.04 1.12  CALCIUM 7.4* 7.2* 7.7* 7.8* 8.1*  MG 1.4* 1.6* 1.8 1.5* 1.8   Liver Function Tests: Recent Labs  Lab 05/10/20 0429 05/11/20 0524 05/12/20 0537 05/13/20 0624 05/14/20 0632  AST 18 17 17 15  13*  ALT 42 31 25 20 18   ALKPHOS 93 84 85 80 84  BILITOT 1.7* 1.4* 1.2 1.2 1.0  PROT 4.7* 4.8* 4.7* 4.8* 5.1*  ALBUMIN 2.2* 2.2* 2.2* 2.2* 2.4*   No results for input(s): LIPASE, AMYLASE in the last 168 hours. No results for input(s): AMMONIA in the last 168 hours. CBC: Recent Labs  Lab 05/10/20 0429 05/11/20 0524  05/12/20 0537 05/13/20 0624 05/14/20 0632  WBC 9.4 10.5 10.3 12.9* 12.0*  HGB 13.5 13.3 13.2 13.1 13.5  HCT 38.9* 37.8* 39.0 37.7* 38.1*  MCV 89.6 88.5 92.0 89.5 89.6  PLT 231 263 279 291 331   Cardiac Enzymes: No results for input(s): CKTOTAL, CKMB, CKMBINDEX, TROPONINI in the last 168 hours. BNP: Invalid input(s): POCBNP CBG: No results for input(s): GLUCAP in the last 168 hours. D-Dimer No results for input(s): DDIMER in the last 72 hours. Hgb A1c No results for input(s): HGBA1C in the last 72 hours. Lipid Profile No results for input(s): CHOL, HDL, LDLCALC, TRIG, CHOLHDL, LDLDIRECT in the last 72 hours. Thyroid function studies No results for input(s): TSH, T4TOTAL, T3FREE, THYROIDAB in the last 72 hours.  Invalid input(s): FREET3 Anemia work up No results for input(s): VITAMINB12, FOLATE, FERRITIN, TIBC, IRON, RETICCTPCT in the last 72 hours. Urinalysis    Component Value Date/Time   COLORURINE YELLOW 06/01/2019 1231   APPEARANCEUR CLEAR 06/01/2019 1231   APPEARANCEUR Clear 11/26/2013 0239   LABSPEC 1.016 06/01/2019 1231   LABSPEC 1.023 11/26/2013 0239   PHURINE 6.0 06/01/2019 1231   GLUCOSEU NEGATIVE 06/01/2019 1231   GLUCOSEU Negative 11/26/2013 0239   HGBUR NEGATIVE 06/01/2019 1231   BILIRUBINUR NEGATIVE 06/01/2019 1231   BILIRUBINUR Negative 07/16/2018 1455   BILIRUBINUR Negative 11/26/2013 0239   KETONESUR NEGATIVE 06/01/2019 1231   PROTEINUR 30 (A) 06/01/2019 1231   UROBILINOGEN 0.2 07/16/2018 1455   NITRITE NEGATIVE 06/01/2019 1231   LEUKOCYTESUR NEGATIVE 06/01/2019 1231   LEUKOCYTESUR Negative 11/26/2013  0239   Sepsis Labs Invalid input(s): PROCALCITONIN,  WBC,  LACTICIDVEN Microbiology Recent Results (from the past 240 hour(s))  Culture, blood (routine x 2)     Status: None   Collection Time: 05/08/20  6:42 PM   Specimen: BLOOD  Result Value Ref Range Status   Specimen Description BLOOD LEFT ANTECUBITAL  Final   Special Requests   Final     BOTTLES DRAWN AEROBIC AND ANAEROBIC Blood Culture adequate volume   Culture   Final    NO GROWTH 5 DAYS Performed at [facility], [address] [place], [place] [address]    Report Status 05/13/2020 FINAL  Final  Culture, blood (routine x 2)     Status: None   Collection Time: 05/08/20  6:42 PM   Specimen: BLOOD  Result Value Ref Range Status   Specimen Description BLOOD BLOOD LEFT HAND  Final   Special Requests   Final    BOTTLES DRAWN AEROBIC ONLY Blood Culture results may not be optimal due to an inadequate volume of blood received in culture bottles   Culture   Final    NO GROWTH 5 DAYS Performed at [facility], [address], [place], [place] [address]    Report Status 05/13/2020 FINAL  Final     Total time spend on discharging this patient, including the last patient exam, discussing the hospital stay, instructions for ongoing care as it relates to all pertinent caregivers, as well as preparing the medical discharge records, prescriptions, and/or referrals as applicable, is 35 minutes.    Enzo Bi, MD  [facility] 05/14/2020, 12:19 PM  If 7PM-7AM, please contact night-coverage

## 2020-05-15 ENCOUNTER — Ambulatory Visit: Payer: Medicare Other

## 2020-05-15 DIAGNOSIS — K819 Cholecystitis, unspecified: Secondary | ICD-10-CM

## 2020-05-15 DIAGNOSIS — N400 Enlarged prostate without lower urinary tract symptoms: Secondary | ICD-10-CM

## 2020-05-15 DIAGNOSIS — I480 Paroxysmal atrial fibrillation: Secondary | ICD-10-CM

## 2020-05-15 DIAGNOSIS — I10 Essential (primary) hypertension: Secondary | ICD-10-CM

## 2020-05-15 DIAGNOSIS — K8050 Calculus of bile duct without cholangitis or cholecystitis without obstruction: Secondary | ICD-10-CM

## 2020-05-15 DIAGNOSIS — G8194 Hemiplegia, unspecified affecting left nondominant side: Secondary | ICD-10-CM

## 2020-05-15 DIAGNOSIS — K219 Gastro-esophageal reflux disease without esophagitis: Secondary | ICD-10-CM

## 2020-05-17 ENCOUNTER — Ambulatory Visit: Payer: Medicare Other | Admitting: Adult Health

## 2020-05-22 ENCOUNTER — Ambulatory Visit: Payer: Medicare Other

## 2020-05-30 ENCOUNTER — Telehealth: Payer: Self-pay | Admitting: Family Medicine

## 2020-06-01 ENCOUNTER — Telehealth: Payer: Self-pay | Admitting: Family Medicine

## 2020-06-01 NOTE — Telephone Encounter (Signed)
Home Health Verbal Orders - Caller/Agency: [facility]/ [facility] Number: [phone] opt 2 secure line  Requesting OT/PT/Skilled Nursing/Social Work/Speech Therapy: Nursing  Frequency: 2x for 4wks, 3prn visits

## 2020-06-01 NOTE — Telephone Encounter (Signed)
Copied from [place] [id number]. Topic: General - Other >> Jun 01, 2020  3:31 PM Marya Landry D wrote: Reason for CRM: Stacy with [facility] would like verbal approval for physical therapy within home, frequency twice a week for five weeks, then once a week for three weeks. She did state it is ok to leave a detailed message on vm.please advise

## 2020-06-05 ENCOUNTER — Other Ambulatory Visit: Payer: Self-pay | Admitting: General Surgery

## 2020-06-05 DIAGNOSIS — K819 Cholecystitis, unspecified: Secondary | ICD-10-CM

## 2020-06-05 NOTE — Telephone Encounter (Signed)
Jose Castro is now requesting OT and PT for patient OT 1x 1 week 2x 2weeks  PT 1x 1week 2x 5weeks 1x 3weeks Call back [phone] 2 ask to speak to lynn

## 2020-06-06 ENCOUNTER — Other Ambulatory Visit: Payer: Self-pay

## 2020-06-11 ENCOUNTER — Other Ambulatory Visit: Payer: Self-pay | Admitting: Family Medicine

## 2020-06-11 NOTE — Telephone Encounter (Signed)
Future appointment in 2 weeks

## 2020-06-13 ENCOUNTER — Ambulatory Visit
Admission: RE | Admit: 2020-06-13 | Discharge: 2020-06-13 | Disposition: A | Discharge status: Home / Self Care | Payer: Medicare Other | Source: Ambulatory Visit | Attending: General Surgery | Admitting: General Surgery

## 2020-06-13 ENCOUNTER — Other Ambulatory Visit: Payer: Self-pay

## 2020-06-13 DIAGNOSIS — K819 Cholecystitis, unspecified: Secondary | ICD-10-CM | POA: Diagnosis not present

## 2020-06-13 MED ORDER — IOPAMIDOL (ISOVUE-300) INJECTION 61%
50.0000 mL | Freq: Once | INTRAVENOUS | Status: AC | PRN
  Administered 2020-06-13: 50 mL

## 2020-06-13 NOTE — Procedures (Signed)
Pre procedural Dx: Acute cholecysitis Post procedural Dx: Same  Cholangiogram performed via existing chole tube demonstrates appropriate positioning and functionality.   No evidence of cholelithiasis or choledocholithiasis.   PLAN:   - The patient's cholecystostomy tube was capped for a trial of internalization.  The patient was instructed to no longer flush the cholecystostomy tube.    - The patient was given an extra gravity bag and instructed to reconnect the cholecystostomy tube to the gravity bag if he were to experience recurrent right upper quadrant obstructive symptoms.     The patient is scheduled to see referring surgeon Dr. Bary Castilla later today.    - If patient is deemed an operative candidate, the cholecystostomy tube should remain in place until the time of definitive cholecystectomy.   - If patient is deemed NOT an operative candidate, and assuming the patient passes his trial of capping/internalization, the patient should return to the radiology department in 1 week for repeat cholangiogram and cholecystostomy tube removal.   The patient demonstrated fair understanding of this discussion.    Ronny Bacon, MD Pager #: [phone]

## 2020-06-21 ENCOUNTER — Other Ambulatory Visit: Payer: Self-pay | Admitting: General Surgery

## 2020-06-21 DIAGNOSIS — K819 Cholecystitis, unspecified: Secondary | ICD-10-CM

## 2020-06-26 ENCOUNTER — Other Ambulatory Visit: Payer: Self-pay

## 2020-06-26 ENCOUNTER — Ambulatory Visit: Payer: Medicare Other | Admitting: Family Medicine

## 2020-06-26 ENCOUNTER — Ambulatory Visit (INDEPENDENT_AMBULATORY_CARE_PROVIDER_SITE_OTHER): Payer: Medicare Other | Admitting: Family Medicine

## 2020-06-26 ENCOUNTER — Encounter: Payer: Self-pay | Admitting: Family Medicine

## 2020-06-26 VITALS — BP 98/66 | HR 69 | Temp 97.3°F | Resp 16 | Ht 71.0 in | Wt 155.7 lb

## 2020-06-26 DIAGNOSIS — H53462 Homonymous bilateral field defects, left side: Secondary | ICD-10-CM

## 2020-06-26 DIAGNOSIS — R5381 Other malaise: Secondary | ICD-10-CM | POA: Diagnosis not present

## 2020-06-26 DIAGNOSIS — M109 Gout, unspecified: Secondary | ICD-10-CM

## 2020-06-26 DIAGNOSIS — Z87891 Personal history of nicotine dependence: Secondary | ICD-10-CM

## 2020-06-26 DIAGNOSIS — I69354 Hemiplegia and hemiparesis following cerebral infarction affecting left non-dominant side: Secondary | ICD-10-CM

## 2020-06-26 DIAGNOSIS — Z9841 Cataract extraction status, right eye: Secondary | ICD-10-CM

## 2020-06-26 DIAGNOSIS — Z86718 Personal history of other venous thrombosis and embolism: Secondary | ICD-10-CM

## 2020-06-26 DIAGNOSIS — Z950 Presence of cardiac pacemaker: Secondary | ICD-10-CM

## 2020-06-26 DIAGNOSIS — R2681 Unsteadiness on feet: Secondary | ICD-10-CM

## 2020-06-26 DIAGNOSIS — K219 Gastro-esophageal reflux disease without esophagitis: Secondary | ICD-10-CM

## 2020-06-26 DIAGNOSIS — Z8719 Personal history of other diseases of the digestive system: Secondary | ICD-10-CM | POA: Diagnosis not present

## 2020-06-26 DIAGNOSIS — E440 Moderate protein-calorie malnutrition: Secondary | ICD-10-CM | POA: Diagnosis not present

## 2020-06-26 DIAGNOSIS — Z87442 Personal history of urinary calculi: Secondary | ICD-10-CM

## 2020-06-26 DIAGNOSIS — K8062 Calculus of gallbladder and bile duct with acute cholecystitis without obstruction: Secondary | ICD-10-CM

## 2020-06-26 DIAGNOSIS — Z4801 Encounter for change or removal of surgical wound dressing: Secondary | ICD-10-CM

## 2020-06-26 DIAGNOSIS — M1990 Unspecified osteoarthritis, unspecified site: Secondary | ICD-10-CM

## 2020-06-26 DIAGNOSIS — Z9181 History of falling: Secondary | ICD-10-CM

## 2020-06-26 DIAGNOSIS — Z4803 Encounter for change or removal of drains: Secondary | ICD-10-CM | POA: Diagnosis not present

## 2020-06-26 DIAGNOSIS — Z85828 Personal history of other malignant neoplasm of skin: Secondary | ICD-10-CM

## 2020-06-26 DIAGNOSIS — N400 Enlarged prostate without lower urinary tract symptoms: Secondary | ICD-10-CM

## 2020-06-26 DIAGNOSIS — I10 Essential (primary) hypertension: Secondary | ICD-10-CM

## 2020-06-26 DIAGNOSIS — I482 Chronic atrial fibrillation, unspecified: Secondary | ICD-10-CM

## 2020-06-26 DIAGNOSIS — E785 Hyperlipidemia, unspecified: Secondary | ICD-10-CM

## 2020-06-26 DIAGNOSIS — E876 Hypokalemia: Secondary | ICD-10-CM

## 2020-06-26 DIAGNOSIS — Z48815 Encounter for surgical aftercare following surgery on the digestive system: Secondary | ICD-10-CM

## 2020-06-26 DIAGNOSIS — M5442 Lumbago with sciatica, left side: Secondary | ICD-10-CM

## 2020-06-26 DIAGNOSIS — B952 Enterococcus as the cause of diseases classified elsewhere: Secondary | ICD-10-CM | POA: Diagnosis not present

## 2020-06-26 DIAGNOSIS — I69398 Other sequelae of cerebral infarction: Secondary | ICD-10-CM

## 2020-06-26 NOTE — Progress Notes (Signed)
Name: Jose Castro   MRN: [id number]    DOB: [birth year]   Date:06/26/2020       Progress Note  Subjective  Chief Complaint  Chief Complaint  Patient presents with  . Follow-up    Cholecysitis    HPI  History of cholecystitis: he was admitted on 05/02/2020 and stayed until June 14 th, stayed at [facility] rehab for almost 2 weeks after that. He finally went home for the past couple of weeks. He will have the cholecystectomy tube removed on Friday. Appetite is not back to normal yet, but denies abdominal pain, nausea or vomiting. Denies fever or chills.   He is still feeling weak, family members states this hospital stay has made him feel as tired as after his stroke, he is getting PT , graduated from OT. Daughter came in with him today and states that they would like to continue PT once he graduates from home PT, he was walking without a cancer after stroke and is back using a walker. His son and son's girlfriend moved in with him to help care for him. A significant decline of his ability to care for himself since hospital stay.   Malnutrition: his albumin dropped during his hospital stay , he is taking boost shakes, trying to eat well.   Patient Active Problem List   Diagnosis Date Noted  . Calculus of bile duct with cholecystitis without obstruction   . Abnormal findings on imaging of biliary tract   . Acute cholecystitis 05/02/2020  . Chronic venous insufficiency 12/05/2019  . 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 CKD (chronic kidney disease), stage II   . New onset atrial fibrillation ([place])   . 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 Goals of care, counseling/discussion   . Palliative care by specialist   . DNR (do not resuscitate) discussion   . History of CVA (cerebrovascular accident) 05/22/2019  . Persistent proteinuria 07/30/2018  . 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 Chronic kidney disease, stage III (moderate) 06/15/2018  .  Leg pain 05/06/2018  . Lymphedema 05/06/2018  . Coagulopathy ([place]) 07/07/2017  . Lumbar spondylosis 03/10/2017  . Elevated uric acid in blood 11/20/2016  . BPH (benign prostatic hyperplasia) 05/29/2016  . Peripheral vascular disease of lower extremity ([place]) 03/03/2016  . Gallstone 12/31/2015  . 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 Atherosclerosis of aorta ([place]) 11/22/2015  . Carotid artery narrowing 11/22/2015  . Diverticulosis of colon 11/22/2015  . Decreased creatinine clearance 11/22/2015  . Arthritis, degenerative 11/22/2015  . Lactose intolerance 11/22/2015  . Basal cell carcinoma 11/22/2015  . Essential hypertension 06/21/2015  . Hyperlipemia 06/21/2015  . GERD (gastroesophageal reflux disease) 06/21/2015  . Calculus of kidney 11/18/2013    Past Surgical History:  Procedure Laterality Date  . APPENDECTOMY    . CATARACT EXTRACTION, BILATERAL    . CYSTOSCOPY WITH INSERTION OF UROLIFT    . ENDOSCOPIC RETROGRADE CHOLANGIOPANCREATOGRAPHY (ERCP) WITH PROPOFOL N/A 05/11/2020   Procedure: ENDOSCOPIC RETROGRADE CHOLANGIOPANCREATOGRAPHY (ERCP) WITH PROPOFOL;  Surgeon: [name], MD;  Location: [facility] ENDOSCOPY;  Service: Endoscopy;  Laterality: N/A;  . EYE SURGERY  08/2018  . IR EXCHANGE BILIARY DRAIN  05/04/2020  . KIDNEY STONE SURGERY    . KNEE SURGERY    . PACEMAKER INSERTION N/A 07/13/2018   Procedure: INSERTION PACEMAKER-DUEL CHAMBER INITIAL IMPLANT;  Surgeon: Isaias Cowman, MD;  Location: [facility] ORS;  Service: Cardiovascular;  Laterality:  N/A;  . SPINE SURGERY      Family History  Problem Relation Age of Onset  . Cancer Brother        bladder cancer with mets  . Heart disease Mother   . Aortic aneurysm Mother   . Heart attack Maternal Aunt   . Heart attack Maternal Uncle     Social History   Tobacco Use  . Smoking status: Former Smoker    Packs/day: 0.00    Years: 16.00    Pack years: 0.00    Types: Cigarettes    Start date:  10/21/1944    Quit date: 10/21/1960    Years since quitting: 59.7  . Smokeless tobacco: Never Used  Substance Use Topics  . Alcohol use: Yes    Alcohol/week: 3.0 standard drinks    Types: 3 Standard drinks or equivalent per week    Comment: once a week when he goes out to dinner-socially     Current Outpatient Medications:  .  acetaminophen (TYLENOL) 325 MG tablet, Take 2 tablets (650 mg total) by mouth every 4 (four) hours as needed for mild pain (or temp > 37.5 C (99.5 F)). (Patient taking differently: Take 650 mg by mouth as needed for mild pain (or temp > 37.5 C (99.5 F)). ), Disp:  , Rfl:  .  apixaban (ELIQUIS) 2.5 MG TABS tablet, Take 1 tablet (2.5 mg total) by mouth 2 (two) times daily., Disp: 60 tablet, Rfl: 0 .  Cholecalciferol (VITAMIN D3) 50 MCG (2000 UT) TABS, Take 2,000 Units by mouth daily., Disp: 30 tablet, Rfl: 0 .  Ensure Max Protein (ENSURE MAX PROTEIN) LIQD, Take 330 mLs (11 oz total) by mouth 2 (two) times daily., Disp: , Rfl:  .  finasteride (PROSCAR) 5 MG tablet, Take 1 tablet (5 mg total) by mouth daily., Disp: 30 tablet, Rfl: 0 .  metoprolol succinate (TOPROL XL) 25 MG 24 hr tablet, Take 1 tablet (25 mg total) by mouth daily., Disp: 30 tablet, Rfl: 0 .  pantoprazole (PROTONIX) 40 MG tablet, TAKE ONE TABLET EVERY DAY, Disp: 90 tablet, Rfl: 0 .  rosuvastatin (CRESTOR) 40 MG tablet, Take 1 tablet (40 mg total) by mouth daily at 6 PM., Disp: 30 tablet, Rfl: 0 .  Omega-3 Fatty Acids (FISH OIL) 1000 MG CAPS, Take 1,000 mg by mouth daily.  (Patient not taking: Reported on 06/26/2020), Disp: , Rfl:  .  ondansetron (ZOFRAN) 4 MG tablet, Take 1 tablet (4 mg total) by mouth every 8 (eight) hours as needed for nausea or vomiting. (Patient not taking: Reported on 06/26/2020), Disp: 20 tablet, Rfl: 0  Allergies  Allergen Reactions  . Penicillins Itching and Swelling    Pt reported arm rash and swelling with penicillin administration while he was in the [employer].  However, pt was  able to tolerate IV Unasyn without any problem, so penicillin allergy unlikely to be real or serious.  05/14/2020.    I personally reviewed active problem list, medication list, allergies, family history, social history, health maintenance with the patient/caregiver today.   ROS  Ten systems reviewed and is negative except as mentioned in HPI   Objective  Vitals:   06/26/20 1327  BP: 98/66  Pulse: 69  Resp: 16  Temp: (!) 97.3 F (36.3 C)  TempSrc: Temporal  SpO2: 99%  Weight: 155 lb 11.2 oz (70.6 kg)  Height: 5\' 11"  (1.803 m)    Body mass index is 21.72 kg/m.  Physical Exam  Constitutional: Patient appears well-developed but  has malnutrition, temporal waisting No distress.  HEENT: head atraumatic, normocephalic, pupils equal and reactive to light,  neck supple Cardiovascular: Normal rate, regular rhythm and normal heart sounds.  No murmur heard. No BLE edema. Pulmonary/Chest: Effort normal and breath sounds normal. No respiratory distress. Abdominal: Soft.  There is no tenderness. Cholecystectomy tube in place  Psychiatric: Patient has a normal mood and affect. behavior is normal. Judgment and thought content normal.  Recent Results (from the past 2160 hour(s))  Basic metabolic panel     Status: Abnormal   Collection Time: 05/01/20  5:01 PM  Result Value Ref Range   Sodium 143 135 - 145 mmol/L   Potassium 3.5 3.5 - 5.1 mmol/L   Chloride 108 98 - 111 mmol/L   CO2 26 22 - 32 mmol/L   Glucose, Bld 92 70 - 99 mg/dL    Comment: Glucose reference range applies only to samples taken after fasting for at least 8 hours.   BUN 18 8 - 23 mg/dL   Creatinine, Ser 1.11 0.61 - 1.24 mg/dL   Calcium 9.0 8.9 - 10.3 mg/dL   GFR calc non Af Amer 58 (L) >60 mL/min   GFR calc Af Amer >60 >60 mL/min   Anion gap 9 5 - 15    Comment: Performed at [facility], [address] [place], [place] [address]  CBC     Status: Abnormal   Collection Time: 05/01/20  5:01 PM  Result  Value Ref Range   WBC 11.4 (H) 4.0 - 10.5 K/uL   RBC 5.06 4.22 - 5.81 MIL/uL   Hemoglobin 15.8 13.0 - 17.0 g/dL   HCT 46.7 39 - 52 %   MCV 92.3 80.0 - 100.0 fL   MCH 31.2 26.0 - 34.0 pg   MCHC 33.8 30.0 - 36.0 g/dL   RDW 13.6 11.5 - 15.5 %   Platelets 287 150 - 400 K/uL   nRBC 0.0 0.0 - 0.2 %    Comment: Performed at [facility], [place], [place] [address]  Troponin I (High Sensitivity)     Status: None   Collection Time: 05/01/20  5:01 PM  Result Value Ref Range   Troponin I (High Sensitivity) 16 <18 ng/L    Comment: (NOTE) Elevated high sensitivity troponin I (hsTnI) values and significant  changes across serial measurements may suggest ACS but many other  chronic and acute conditions are known to elevate hsTnI results.  Refer to the "Links" section for chest pain algorithms and additional  guidance. Performed at [facility], [address] [place], [place] [address]   Lipase, blood     Status: None   Collection Time: 05/01/20  5:01 PM  Result Value Ref Range   Lipase 46 11 - 51 U/L    Comment: Performed at [facility], [place], [place] [address]  Troponin I (High Sensitivity)     Status: None   Collection Time: 05/01/20  7:03 PM  Result Value Ref Range   Troponin I (High Sensitivity) 16 <18 ng/L    Comment: (NOTE) Elevated high sensitivity troponin I (hsTnI) values and significant  changes across serial measurements may suggest ACS but many other  chronic and acute conditions are known to elevate hsTnI results.  Refer to the "Links" section for chest pain algorithms and additional  guidance. Performed at [facility], [address], [place], [place] [address]   CBC with Differential/Platelet     Status:  Abnormal   Collection Time: 05/02/20 12:25 PM  Result Value Ref Range   WBC 24.2 (H) 3.8 - 10.8 Thousand/uL   RBC 4.73 4.20 - 5.80 Million/uL   Hemoglobin 14.8 13.2 - 17.1 g/dL   HCT 44.2  38 - 50 %   MCV 93.4 80.0 - 100.0 fL   MCH 31.3 27.0 - 33.0 pg   MCHC 33.5 32.0 - 36.0 g/dL   RDW 12.3 11.0 - 15.0 %   Platelets 247 140 - 400 Thousand/uL   MPV 11.5 7.5 - 12.5 fL   Neutro Abs 21,659 (H) 1,500 - 7,800 cells/uL   Lymphs Abs 799 (L) 850 - 3,900 cells/uL   Absolute Monocytes 1,573 (H) 200 - 950 cells/uL   Eosinophils Absolute 121 15 - 500 cells/uL   Basophils Absolute 48 0 - 200 cells/uL   Neutrophils Relative % 89.5 %   Total Lymphocyte 3.3 %   Monocytes Relative 6.5 %   Eosinophils Relative 0.5 %   Basophils Relative 0.2 %  Protime-INR     Status: Abnormal   Collection Time: 05/02/20  7:53 PM  Result Value Ref Range   Prothrombin Time 22.2 (H) 11.4 - 15.2 seconds   INR 2.0 (H) 0.8 - 1.2    Comment: Performed at [facility], [address] [place], [place] [address]  PTT factor inhibitor (mixing study)     Status: Abnormal   Collection Time: 05/02/20  7:53 PM  Result Value Ref Range   aPTT 31.0 (H) 22.9 - 30.2 sec    Comment: (NOTE) Performed At: [facility] [place], [place] [id number] Rush Farmer MD [id number]    aPTT 1:1 Normal Plasma NOT PERFORMED     Comment: Test not performed   aPTT 1:1 Mix Saline NOT PERFORMED     Comment: Test not performed   aPTT 1:1 NP Mix, 60 Min,Incub. NOT PERFORMED     Comment: Test not performed  Comprehensive metabolic panel     Status: Abnormal   Collection Time: 05/02/20  8:50 PM  Result Value Ref Range   Sodium 134 (L) 135 - 145 mmol/L   Potassium 4.1 3.5 - 5.1 mmol/L   Chloride 100 98 - 111 mmol/L   CO2 24 22 - 32 mmol/L   Glucose, Bld 104 (H) 70 - 99 mg/dL    Comment: Glucose reference range applies only to samples taken after fasting for at least 8 hours.   BUN 30 (H) 8 - 23 mg/dL   Creatinine, Ser 1.59 (H) 0.61 - 1.24 mg/dL   Calcium 8.5 (L) 8.9 - 10.3 mg/dL   Total Protein 5.8 (L) 6.5 - 8.1 g/dL   Albumin 3.2 (L) 3.5 - 5.0 g/dL   AST 659 (H) 15 - 41 U/L   ALT 690 (H) 0  - 44 U/L   Alkaline Phosphatase 162 (H) 38 - 126 U/L   Total Bilirubin 6.0 (H) 0.3 - 1.2 mg/dL   GFR calc non Af Amer 37 (L) >60 mL/min   GFR calc Af Amer 43 (L) >60 mL/min   Anion gap 10 5 - 15    Comment: Performed at [facility], [address] [place], [place] [address]  Troponin I (High Sensitivity)     Status: Abnormal   Collection Time: 05/02/20  8:50 PM  Result Value Ref Range   Troponin I (High Sensitivity) 48 (H) <18 ng/L    Comment: RESULT CALLED TO, READ BACK AND VERIFIED WITH CHRISTAL BATH ON 05/02/20  AT 2136 BY JAG (NOTE) Elevated high sensitivity troponin I (hsTnI) values and significant  changes across serial measurements may suggest ACS but many other  chronic and acute conditions are known to elevate hsTnI results.  Refer to the "Links" section for chest pain algorithms and additional  guidance. Performed at [facility], [place], [place] [address]   Troponin I (High Sensitivity)     Status: Abnormal   Collection Time: 05/02/20 11:08 PM  Result Value Ref Range   Troponin I (High Sensitivity) 48 (H) <18 ng/L    Comment: (NOTE) Elevated high sensitivity troponin I (hsTnI) values and significant  changes across serial measurements may suggest ACS but many other  chronic and acute conditions are known to elevate hsTnI results.  Refer to the "Links" section for chest pain algorithms and additional  guidance. Performed at [facility], [address] [place], [place] [address]   Comprehensive metabolic panel     Status: Abnormal   Collection Time: 05/03/20  5:55 AM  Result Value Ref Range   Sodium 131 (L) 135 - 145 mmol/L   Potassium 4.1 3.5 - 5.1 mmol/L   Chloride 101 98 - 111 mmol/L   CO2 24 22 - 32 mmol/L   Glucose, Bld 125 (H) 70 - 99 mg/dL    Comment: Glucose reference range applies only to samples taken after fasting for at least 8 hours.   BUN 32 (H) 8 - 23 mg/dL   Creatinine, Ser 1.82 (H) 0.61 - 1.24  mg/dL   Calcium 8.0 (L) 8.9 - 10.3 mg/dL   Total Protein 5.4 (L) 6.5 - 8.1 g/dL   Albumin 2.8 (L) 3.5 - 5.0 g/dL   AST 420 (H) 15 - 41 U/L   ALT 536 (H) 0 - 44 U/L   Alkaline Phosphatase 142 (H) 38 - 126 U/L   Total Bilirubin 5.5 (H) 0.3 - 1.2 mg/dL   GFR calc non Af Amer 32 (L) >60 mL/min   GFR calc Af Amer 37 (L) >60 mL/min   Anion gap 6 5 - 15    Comment: Performed at [facility], [address] [place], [place] [address]  CBC     Status: Abnormal   Collection Time: 05/03/20  5:55 AM  Result Value Ref Range   WBC 23.0 (H) 4.0 - 10.5 K/uL   RBC 4.37 4.22 - 5.81 MIL/uL   Hemoglobin 13.8 13.0 - 17.0 g/dL   HCT 39.1 39 - 52 %   MCV 89.5 80.0 - 100.0 fL   MCH 31.6 26.0 - 34.0 pg   MCHC 35.3 30.0 - 36.0 g/dL   RDW 14.2 11.5 - 15.5 %   Platelets 195 150 - 400 K/uL   nRBC 0.0 0.0 - 0.2 %    Comment: Performed at [facility], [address] [place], [place] [address]  Magnesium     Status: Abnormal   Collection Time: 05/03/20  5:55 AM  Result Value Ref Range   Magnesium 1.5 (L) 1.7 - 2.4 mg/dL    Comment: Performed at [facility], [address] [place], [place] [address]  SARS Coronavirus 2 by RT PCR (hospital order, performed in [facility] hospital lab) Nasopharyngeal Nasopharyngeal Swab     Status: None   Collection Time: 05/03/20 10:25 AM   Specimen: Nasopharyngeal Swab  Result Value Ref Range   SARS Coronavirus 2 NEGATIVE NEGATIVE    Comment: (NOTE) SARS-CoV-2 target nucleic acids are NOT DETECTED. The  SARS-CoV-2 RNA is generally detectable in upper and lower respiratory specimens during the acute phase of infection. The lowest concentration of SARS-CoV-2 viral copies this assay can detect is 250 copies / mL. A negative result does not preclude SARS-CoV-2 infection and should not be used as the sole basis for treatment or other patient management decisions.  A negative result may occur with improper specimen collection / handling,  submission of specimen other than nasopharyngeal swab, presence of viral mutation(s) within the areas targeted by this assay, and inadequate number of viral copies (<250 copies / mL). A negative result must be combined with clinical observations, patient history, and epidemiological information. Fact Sheet for Patients:   StrictlyIdeas.no Fact Sheet for Healthcare Providers: BankingDealers.co.za This test is not yet approved or cleared  by the [place] FDA and has been authorized for detection and/or diagnosis of SARS-CoV-2 by FDA under an Emergency Use Authorization (EUA).  This EUA will remain in effect (meaning this test can be used) for the duration of the COVID-19 declaration under Section 564(b)(1) of the Act, 21 U.S.C. section 360bbb-3(b)(1), unless the authorization is terminated or revoked sooner. Performed at [facility], [address] [place], [place] [address]   Aerobic/Anaerobic Culture (surgical/deep wound)     Status: None   Collection Time: 05/03/20 12:51 PM   Specimen: Gallbladder; Bile  Result Value Ref Range   Specimen Description      GALL BLADDER Performed at [facility], [address] [place], [place] [address]    Special Requests      Normal Performed at [facility], [place], [place] [address]    Gram Stain      RARE WBC PRESENT,BOTH PMN AND MONONUCLEAR FEW GRAM POSITIVE COCCI IN CHAINS    Culture      ABUNDANT ENTEROCOCCUS FAECIUM ABUNDANT ENTEROCOCCUS FAECALIS NO ANAEROBES ISOLATED Performed at [facility], [place] [address], [place], [place] [address]    Report Status 05/08/2020 FINAL    Organism ID, Bacteria ENTEROCOCCUS FAECIUM    Organism ID, Bacteria ENTEROCOCCUS FAECALIS       Susceptibility   Enterococcus faecalis - MIC*    AMPICILLIN <=2 SENSITIVE Sensitive     VANCOMYCIN 2 SENSITIVE Sensitive     GENTAMICIN SYNERGY SENSITIVE  Sensitive     * ABUNDANT ENTEROCOCCUS FAECALIS   Enterococcus faecium - MIC*    AMPICILLIN <=2 SENSITIVE Sensitive     VANCOMYCIN 1 SENSITIVE Sensitive     GENTAMICIN SYNERGY SENSITIVE Sensitive     * ABUNDANT ENTEROCOCCUS FAECIUM  CBC with Differential/Platelet     Status: Abnormal   Collection Time: 05/04/20  4:18 AM  Result Value Ref Range   WBC 13.2 (H) 4.0 - 10.5 K/uL   RBC 4.31 4.22 - 5.81 MIL/uL   Hemoglobin 13.2 13.0 - 17.0 g/dL   HCT 39.2 39 - 52 %   MCV 91.0 80.0 - 100.0 fL   MCH 30.6 26.0 - 34.0 pg   MCHC 33.7 30.0 - 36.0 g/dL   RDW 14.3 11.5 - 15.5 %   Platelets 167 150 - 400 K/uL   nRBC 0.0 0.0 - 0.2 %   Neutrophils Relative % 87 %   Neutro Abs 11.4 (H) 1.7 - 7.7 K/uL   Lymphocytes Relative 6 %   Lymphs Abs 0.7 0.7 - 4.0 K/uL   Monocytes Relative 6 %   Monocytes Absolute 0.9 0 - 1 K/uL   Eosinophils Relative 0 %   Eosinophils  Absolute 0.0 0 - 0 K/uL   Basophils Relative 0 %   Basophils Absolute 0.0 0 - 0 K/uL   Immature Granulocytes 1 %   Abs Immature Granulocytes 0.18 (H) 0.00 - 0.07 K/uL    Comment: Performed at [facility], [address] [place], [place] [address]  Hepatic function panel     Status: Abnormal   Collection Time: 05/04/20  4:18 AM  Result Value Ref Range   Total Protein 5.1 (L) 6.5 - 8.1 g/dL   Albumin 2.5 (L) 3.5 - 5.0 g/dL   AST 177 (H) 15 - 41 U/L   ALT 321 (H) 0 - 44 U/L   Alkaline Phosphatase 164 (H) 38 - 126 U/L   Total Bilirubin 4.7 (H) 0.3 - 1.2 mg/dL   Bilirubin, Direct 2.8 (H) 0.0 - 0.2 mg/dL   Indirect Bilirubin 1.9 (H) 0.3 - 0.9 mg/dL    Comment: Performed at [facility], [address] [place], [place] [address]  Basic metabolic panel     Status: Abnormal   Collection Time: 05/04/20  4:18 AM  Result Value Ref Range   Sodium 134 (L) 135 - 145 mmol/L   Potassium 4.0 3.5 - 5.1 mmol/L   Chloride 104 98 - 111 mmol/L   CO2 22 22 - 32 mmol/L   Glucose, Bld 134 (H) 70 - 99 mg/dL    Comment: Glucose  reference range applies only to samples taken after fasting for at least 8 hours.   BUN 37 (H) 8 - 23 mg/dL   Creatinine, Ser 2.17 (H) 0.61 - 1.24 mg/dL   Calcium 7.8 (L) 8.9 - 10.3 mg/dL   GFR calc non Af Amer 26 (L) >60 mL/min   GFR calc Af Amer 30 (L) >60 mL/min   Anion gap 8 5 - 15    Comment: Performed at [facility], [address] [place], [place] [address]  CBC     Status: Abnormal   Collection Time: 05/05/20  5:28 AM  Result Value Ref Range   WBC 10.3 4.0 - 10.5 K/uL   RBC 4.39 4.22 - 5.81 MIL/uL   Hemoglobin 13.4 13.0 - 17.0 g/dL   HCT 38.6 (L) 39 - 52 %   MCV 87.9 80.0 - 100.0 fL   MCH 30.5 26.0 - 34.0 pg   MCHC 34.7 30.0 - 36.0 g/dL   RDW 14.4 11.5 - 15.5 %   Platelets 183 150 - 400 K/uL   nRBC 0.0 0.0 - 0.2 %    Comment: Performed at [facility], [address] [place], [place] [address]  Basic metabolic panel     Status: Abnormal   Collection Time: 05/05/20  5:28 AM  Result Value Ref Range   Sodium 137 135 - 145 mmol/L   Potassium 4.0 3.5 - 5.1 mmol/L   Chloride 109 98 - 111 mmol/L   CO2 23 22 - 32 mmol/L   Glucose, Bld 112 (H) 70 - 99 mg/dL    Comment: Glucose reference range applies only to samples taken after fasting for at least 8 hours.   BUN 35 (H) 8 - 23 mg/dL   Creatinine, Ser 2.19 (H) 0.61 - 1.24 mg/dL   Calcium 8.0 (L) 8.9 - 10.3 mg/dL   GFR calc non Af Amer 25 (L) >60 mL/min   GFR calc Af Amer 29 (L) >60 mL/min   Anion gap 5 5 - 15    Comment: Performed at [facility], [address] [place], [place] [address]  Hepatic function panel     Status: Abnormal   Collection Time: 05/05/20  5:28 AM  Result Value Ref Range   Total Protein 5.0 (L) 6.5 - 8.1 g/dL   Albumin 2.4 (L) 3.5 - 5.0 g/dL   AST 126 (H) 15 - 41 U/L   ALT 238 (H) 0 - 44 U/L   Alkaline Phosphatase 186 (H) 38 - 126 U/L   Total Bilirubin 2.3 (H) 0.3 - 1.2 mg/dL   Bilirubin, Direct 1.2 (H) 0.0 - 0.2 mg/dL   Indirect Bilirubin 1.1 (H) 0.3 - 0.9 mg/dL     Comment: Performed at [facility], [address] [place], [place] [address]  Comprehensive metabolic panel     Status: Abnormal   Collection Time: 05/06/20  7:10 AM  Result Value Ref Range   Sodium 139 135 - 145 mmol/L   Potassium 3.0 (L) 3.5 - 5.1 mmol/L   Chloride 104 98 - 111 mmol/L   CO2 22 22 - 32 mmol/L   Glucose, Bld 94 70 - 99 mg/dL    Comment: Glucose reference range applies only to samples taken after fasting for at least 8 hours.   BUN 37 (H) 8 - 23 mg/dL   Creatinine, Ser 2.19 (H) 0.61 - 1.24 mg/dL   Calcium 8.6 (L) 8.9 - 10.3 mg/dL   Total Protein 5.9 (L) 6.5 - 8.1 g/dL   Albumin 2.7 (L) 3.5 - 5.0 g/dL   AST 59 (H) 15 - 41 U/L   ALT 183 (H) 0 - 44 U/L   Alkaline Phosphatase 184 (H) 38 - 126 U/L   Total Bilirubin 2.6 (H) 0.3 - 1.2 mg/dL   GFR calc non Af Amer 25 (L) >60 mL/min   GFR calc Af Amer 29 (L) >60 mL/min   Anion gap 13 5 - 15    Comment: Performed at [facility], [address] [place], [place] [address]  Magnesium     Status: None   Collection Time: 05/06/20  7:10 AM  Result Value Ref Range   Magnesium 1.9 1.7 - 2.4 mg/dL    Comment: Performed at [facility], [address] [place], [place] [address]  CBC with Differential/Platelet     Status: Abnormal   Collection Time: 05/06/20  7:10 AM  Result Value Ref Range   WBC 13.6 (H) 4.0 - 10.5 K/uL   RBC 5.17 4.22 - 5.81 MIL/uL   Hemoglobin 15.8 13.0 - 17.0 g/dL   HCT 44.7 39 - 52 %   MCV 86.5 80.0 - 100.0 fL   MCH 30.6 26.0 - 34.0 pg   MCHC 35.3 30.0 - 36.0 g/dL   RDW 14.5 11.5 - 15.5 %   Platelets 226 150 - 400 K/uL   nRBC 0.0 0.0 - 0.2 %   Neutrophils Relative % 76 %   Neutro Abs 10.6 (H) 1.7 - 7.7 K/uL   Lymphocytes Relative 10 %   Lymphs Abs 1.3 0.7 - 4.0 K/uL   Monocytes Relative 11 %   Monocytes Absolute 1.5 (H) 0 - 1 K/uL   Eosinophils Relative 1 %   Eosinophils Absolute 0.1 0 - 0 K/uL   Basophils Relative 1 %   Basophils Absolute 0.1 0 - 0 K/uL    Immature Granulocytes 1 %   Abs Immature Granulocytes 0.07 0.00 - 0.07 K/uL    Comment: Performed at [facility], [address], [place], [place] [address]  Comprehensive metabolic panel  Status: Abnormal   Collection Time: 05/07/20  6:04 AM  Result Value Ref Range   Sodium 137 135 - 145 mmol/L   Potassium 2.9 (L) 3.5 - 5.1 mmol/L   Chloride 105 98 - 111 mmol/L   CO2 23 22 - 32 mmol/L   Glucose, Bld 88 70 - 99 mg/dL    Comment: Glucose reference range applies only to samples taken after fasting for at least 8 hours.   BUN 37 (H) 8 - 23 mg/dL   Creatinine, Ser 1.98 (H) 0.61 - 1.24 mg/dL   Calcium 8.0 (L) 8.9 - 10.3 mg/dL   Total Protein 4.8 (L) 6.5 - 8.1 g/dL   Albumin 2.2 (L) 3.5 - 5.0 g/dL   AST 31 15 - 41 U/L   ALT 110 (H) 0 - 44 U/L   Alkaline Phosphatase 137 (H) 38 - 126 U/L   Total Bilirubin 2.3 (H) 0.3 - 1.2 mg/dL   GFR calc non Af Amer 29 (L) >60 mL/min   GFR calc Af Amer 33 (L) >60 mL/min   Anion gap 9 5 - 15    Comment: Performed at [facility], [address] [place], [place] [address]  CBC     Status: Abnormal   Collection Time: 05/07/20  6:04 AM  Result Value Ref Range   WBC 12.2 (H) 4.0 - 10.5 K/uL   RBC 4.55 4.22 - 5.81 MIL/uL   Hemoglobin 14.1 13.0 - 17.0 g/dL   HCT 39.3 39 - 52 %   MCV 86.4 80.0 - 100.0 fL   MCH 31.0 26.0 - 34.0 pg   MCHC 35.9 30.0 - 36.0 g/dL   RDW 14.5 11.5 - 15.5 %   Platelets 206 150 - 400 K/uL   nRBC 0.0 0.0 - 0.2 %    Comment: Performed at [facility], [address], [place], [place] [address]  Magnesium     Status: None   Collection Time: 05/07/20  6:04 AM  Result Value Ref Range   Magnesium 1.7 1.7 - 2.4 mg/dL    Comment: Performed at [facility], [address] [place], [place] [address]  Comprehensive metabolic panel     Status: Abnormal   Collection Time: 05/08/20  4:41 AM  Result Value Ref Range   Sodium 140 135 - 145 mmol/L   Potassium 3.2 (L) 3.5 - 5.1 mmol/L   Chloride  107 98 - 111 mmol/L   CO2 21 (L) 22 - 32 mmol/L   Glucose, Bld 79 70 - 99 mg/dL    Comment: Glucose reference range applies only to samples taken after fasting for at least 8 hours.   BUN 33 (H) 8 - 23 mg/dL   Creatinine, Ser 1.80 (H) 0.61 - 1.24 mg/dL   Calcium 8.1 (L) 8.9 - 10.3 mg/dL   Total Protein 5.1 (L) 6.5 - 8.1 g/dL   Albumin 2.5 (L) 3.5 - 5.0 g/dL   AST 31 15 - 41 U/L   ALT 83 (H) 0 - 44 U/L   Alkaline Phosphatase 126 38 - 126 U/L   Total Bilirubin 2.9 (H) 0.3 - 1.2 mg/dL   GFR calc non Af Amer 32 (L) >60 mL/min   GFR calc Af Amer 37 (L) >60 mL/min   Anion gap 12 5 - 15    Comment: Performed at [facility], [address] [place], [place] [address]  Magnesium     Status: None   Collection Time: 05/08/20  4:41 AM  Result  Value Ref Range   Magnesium 2.0 1.7 - 2.4 mg/dL    Comment: Performed at [facility], [address] [place], [place] [address]  CBC     Status: Abnormal   Collection Time: 05/08/20  4:41 AM  Result Value Ref Range   WBC 11.7 (H) 4.0 - 10.5 K/uL   RBC 4.64 4.22 - 5.81 MIL/uL   Hemoglobin 14.6 13.0 - 17.0 g/dL   HCT 41.5 39 - 52 %   MCV 89.4 80.0 - 100.0 fL   MCH 31.5 26.0 - 34.0 pg   MCHC 35.2 30.0 - 36.0 g/dL   RDW 14.4 11.5 - 15.5 %   Platelets 205 150 - 400 K/uL   nRBC 0.0 0.0 - 0.2 %    Comment: Performed at [facility], [address] [place], [place] [address]  Culture, blood (routine x 2)     Status: None   Collection Time: 05/08/20  6:42 PM   Specimen: BLOOD  Result Value Ref Range   Specimen Description BLOOD LEFT ANTECUBITAL    Special Requests      BOTTLES DRAWN AEROBIC AND ANAEROBIC Blood Culture adequate volume   Culture      NO GROWTH 5 DAYS Performed at [facility], [address] [place], [place] [address]    Report Status 05/13/2020 FINAL   Culture, blood (routine x 2)     Status: None   Collection Time: 05/08/20  6:42 PM   Specimen: BLOOD  Result Value Ref Range   Specimen  Description BLOOD BLOOD LEFT HAND    Special Requests      BOTTLES DRAWN AEROBIC ONLY Blood Culture results may not be optimal due to an inadequate volume of blood received in culture bottles   Culture      NO GROWTH 5 DAYS Performed at [facility], [address] [place], [place] [address]    Report Status 05/13/2020 FINAL   CBC     Status: Abnormal   Collection Time: 05/09/20  4:44 AM  Result Value Ref Range   WBC 10.2 4.0 - 10.5 K/uL   RBC 4.29 4.22 - 5.81 MIL/uL   Hemoglobin 13.3 13.0 - 17.0 g/dL   HCT 38.6 (L) 39 - 52 %   MCV 90.0 80.0 - 100.0 fL   MCH 31.0 26.0 - 34.0 pg   MCHC 34.5 30.0 - 36.0 g/dL   RDW 14.4 11.5 - 15.5 %   Platelets 210 150 - 400 K/uL   nRBC 0.0 0.0 - 0.2 %    Comment: Performed at [facility], [address] [place], [place] [address]  Comprehensive metabolic panel     Status: Abnormal   Collection Time: 05/09/20  4:44 AM  Result Value Ref Range   Sodium 139 135 - 145 mmol/L   Potassium 3.3 (L) 3.5 - 5.1 mmol/L    Comment: HEMOLYSIS AT THIS LEVEL MAY AFFECT RESULT   Chloride 106 98 - 111 mmol/L   CO2 25 22 - 32 mmol/L   Glucose, Bld 141 (H) 70 - 99 mg/dL    Comment: Glucose reference range applies only to samples taken after fasting for at least 8 hours.   BUN 30 (H) 8 - 23 mg/dL   Creatinine, Ser 1.43 (H) 0.61 - 1.24 mg/dL   Calcium 7.7 (L) 8.9 - 10.3 mg/dL   Total Protein 4.7 (L) 6.5 - 8.1 g/dL   Albumin 2.3 (L) 3.5 - 5.0 g/dL   AST 24 15 -  41 U/L    Comment: HEMOLYSIS AT THIS LEVEL MAY AFFECT RESULT   ALT 54 (H) 0 - 44 U/L   Alkaline Phosphatase 103 38 - 126 U/L   Total Bilirubin 2.4 (H) 0.3 - 1.2 mg/dL    Comment: HEMOLYSIS AT THIS LEVEL MAY AFFECT RESULT   GFR calc non Af Amer 43 (L) >60 mL/min   GFR calc Af Amer 49 (L) >60 mL/min   Anion gap 8 5 - 15    Comment: Performed at [facility], [address] [place], [place] [address]  Magnesium     Status: None   Collection Time: 05/09/20  4:44 AM  Result  Value Ref Range   Magnesium 1.7 1.7 - 2.4 mg/dL    Comment: Performed at [facility], [address] [place], [place] [address]  CBC     Status: Abnormal   Collection Time: 05/10/20  4:29 AM  Result Value Ref Range   WBC 9.4 4.0 - 10.5 K/uL   RBC 4.34 4.22 - 5.81 MIL/uL   Hemoglobin 13.5 13.0 - 17.0 g/dL   HCT 38.9 (L) 39 - 52 %   MCV 89.6 80.0 - 100.0 fL   MCH 31.1 26.0 - 34.0 pg   MCHC 34.7 30.0 - 36.0 g/dL   RDW 14.2 11.5 - 15.5 %   Platelets 231 150 - 400 K/uL   nRBC 0.0 0.0 - 0.2 %    Comment: Performed at [facility], [address], [place], [place] [address]  Comprehensive metabolic panel     Status: Abnormal   Collection Time: 05/10/20  4:29 AM  Result Value Ref Range   Sodium 139 135 - 145 mmol/L   Potassium 2.4 (LL) 3.5 - 5.1 mmol/L    Comment: CRITICAL RESULT CALLED TO, READ BACK BY AND VERIFIED WITH JULIE BRYAN AT 7169 ON 05/10/2020 [place].    Chloride 106 98 - 111 mmol/L   CO2 26 22 - 32 mmol/L   Glucose, Bld 115 (H) 70 - 99 mg/dL    Comment: Glucose reference range applies only to samples taken after fasting for at least 8 hours.   BUN 19 8 - 23 mg/dL   Creatinine, Ser 1.25 (H) 0.61 - 1.24 mg/dL   Calcium 7.4 (L) 8.9 - 10.3 mg/dL   Total Protein 4.7 (L) 6.5 - 8.1 g/dL   Albumin 2.2 (L) 3.5 - 5.0 g/dL   AST 18 15 - 41 U/L   ALT 42 0 - 44 U/L   Alkaline Phosphatase 93 38 - 126 U/L   Total Bilirubin 1.7 (H) 0.3 - 1.2 mg/dL   GFR calc non Af Amer 50 (L) >60 mL/min   GFR calc Af Amer 58 (L) >60 mL/min   Anion gap 7 5 - 15    Comment: Performed at [facility], [address], [place], [place] [address]  Magnesium     Status: Abnormal   Collection Time: 05/10/20  4:29 AM  Result Value Ref Range   Magnesium 1.4 (L) 1.7 - 2.4 mg/dL    Comment: Performed at [facility], [address] [place], [place] [address]  CBC     Status: Abnormal   Collection Time: 05/11/20  5:24 AM  Result Value Ref Range   WBC 10.5 4.0 - 10.5  K/uL   RBC 4.27 4.22 - 5.81 MIL/uL   Hemoglobin 13.3 13.0 - 17.0 g/dL   HCT 37.8 (L) 39 - 52 %   MCV 88.5 80.0 -  100.0 fL   MCH 31.1 26.0 - 34.0 pg   MCHC 35.2 30.0 - 36.0 g/dL   RDW 14.7 11.5 - 15.5 %   Platelets 263 150 - 400 K/uL   nRBC 0.0 0.0 - 0.2 %    Comment: Performed at [facility], [address] [place], [place] [address]  Comprehensive metabolic panel     Status: Abnormal   Collection Time: 05/11/20  5:24 AM  Result Value Ref Range   Sodium 136 135 - 145 mmol/L   Potassium 3.1 (L) 3.5 - 5.1 mmol/L   Chloride 105 98 - 111 mmol/L   CO2 23 22 - 32 mmol/L   Glucose, Bld 120 (H) 70 - 99 mg/dL    Comment: Glucose reference range applies only to samples taken after fasting for at least 8 hours.   BUN 14 8 - 23 mg/dL   Creatinine, Ser 1.13 0.61 - 1.24 mg/dL   Calcium 7.2 (L) 8.9 - 10.3 mg/dL   Total Protein 4.8 (L) 6.5 - 8.1 g/dL   Albumin 2.2 (L) 3.5 - 5.0 g/dL   AST 17 15 - 41 U/L   ALT 31 0 - 44 U/L   Alkaline Phosphatase 84 38 - 126 U/L   Total Bilirubin 1.4 (H) 0.3 - 1.2 mg/dL   GFR calc non Af Amer 57 (L) >60 mL/min   GFR calc Af Amer >60 >60 mL/min   Anion gap 8 5 - 15    Comment: Performed at [facility], [address] [place], [place] [address]  Magnesium     Status: Abnormal   Collection Time: 05/11/20  5:24 AM  Result Value Ref Range   Magnesium 1.6 (L) 1.7 - 2.4 mg/dL    Comment: Performed at [facility], [address] [place], [place] [address]  CBC     Status: None   Collection Time: 05/12/20  5:37 AM  Result Value Ref Range   WBC 10.3 4.0 - 10.5 K/uL   RBC 4.24 4.22 - 5.81 MIL/uL   Hemoglobin 13.2 13.0 - 17.0 g/dL   HCT 39.0 39 - 52 %   MCV 92.0 80.0 - 100.0 fL   MCH 31.1 26.0 - 34.0 pg   MCHC 33.8 30.0 - 36.0 g/dL   RDW 14.8 11.5 - 15.5 %   Platelets 279 150 - 400 K/uL   nRBC 0.0 0.0 - 0.2 %    Comment: Performed at [facility], [address] [place],  [address]  Comprehensive metabolic  panel     Status: Abnormal   Collection Time: 05/12/20  5:37 AM  Result Value Ref Range   Sodium 137 135 - 145 mmol/L   Potassium 3.6 3.5 - 5.1 mmol/L   Chloride 107 98 - 111 mmol/L   CO2 22 22 - 32 mmol/L   Glucose, Bld 116 (H) 70 - 99 mg/dL    Comment: Glucose reference range applies only to samples taken after fasting for at least 8 hours.   BUN 14 8 - 23 mg/dL   Creatinine, Ser 1.17 0.61 - 1.24 mg/dL   Calcium 7.7 (L) 8.9 - 10.3 mg/dL   Total Protein 4.7 (L) 6.5 - 8.1 g/dL   Albumin 2.2 (L) 3.5 - 5.0 g/dL   AST 17 15 - 41 U/L   ALT 25 0 - 44 U/L   Alkaline Phosphatase 85 38 - 126 U/L   Total Bilirubin 1.2 0.3 - 1.2 mg/dL   GFR calc non Af  Amer 54 (L) >60 mL/min   GFR calc Af Amer >60 >60 mL/min   Anion gap 8 5 - 15    Comment: Performed at [name], [address] [place], [place] [address]  Magnesium     Status: None   Collection Time: 05/12/20  5:37 AM  Result Value Ref Range   Magnesium 1.8 1.7 - 2.4 mg/dL    Comment: Performed at [facility], [address] [place], [place] [address]  CBC     Status: Abnormal   Collection Time: 05/13/20  6:24 AM  Result Value Ref Range   WBC 12.9 (H) 4.0 - 10.5 K/uL   RBC 4.21 (L) 4.22 - 5.81 MIL/uL   Hemoglobin 13.1 13.0 - 17.0 g/dL   HCT 37.7 (L) 39 - 52 %   MCV 89.5 80.0 - 100.0 fL   MCH 31.1 26.0 - 34.0 pg   MCHC 34.7 30.0 - 36.0 g/dL   RDW 14.9 11.5 - 15.5 %   Platelets 291 150 - 400 K/uL   nRBC 0.0 0.0 - 0.2 %    Comment: Performed at [facility], [address] [place], [place] [address]  Comprehensive metabolic panel     Status: Abnormal   Collection Time: 05/13/20  6:24 AM  Result Value Ref Range   Sodium 136 135 - 145 mmol/L   Potassium 3.5 3.5 - 5.1 mmol/L   Chloride 106 98 - 111 mmol/L   CO2 25 22 - 32 mmol/L   Glucose, Bld 98 70 - 99 mg/dL    Comment: Glucose reference range applies only to samples taken after fasting for at least 8 hours.   BUN 18 8 - 23 mg/dL    Creatinine, Ser 1.04 0.61 - 1.24 mg/dL   Calcium 7.8 (L) 8.9 - 10.3 mg/dL   Total Protein 4.8 (L) 6.5 - 8.1 g/dL   Albumin 2.2 (L) 3.5 - 5.0 g/dL   AST 15 15 - 41 U/L   ALT 20 0 - 44 U/L   Alkaline Phosphatase 80 38 - 126 U/L   Total Bilirubin 1.2 0.3 - 1.2 mg/dL   GFR calc non Af Amer >60 >60 mL/min   GFR calc Af Amer >60 >60 mL/min   Anion gap 5 5 - 15    Comment: Performed at [facility], [address] [place], [place] [address]  Magnesium     Status: Abnormal   Collection Time: 05/13/20  6:24 AM  Result Value Ref Range   Magnesium 1.5 (L) 1.7 - 2.4 mg/dL    Comment: Performed at [facility], [address] [place], [place] [address]  CBC     Status: Abnormal   Collection Time: 05/14/20  6:32 AM  Result Value Ref Range   WBC 12.0 (H) 4.0 - 10.5 K/uL   RBC 4.25 4.22 - 5.81 MIL/uL   Hemoglobin 13.5 13.0 - 17.0 g/dL   HCT 38.1 (L) 39 - 52 %   MCV 89.6 80.0 - 100.0 fL   MCH 31.8 26.0 - 34.0 pg   MCHC 35.4 30.0 - 36.0 g/dL   RDW 15.1 11.5 - 15.5 %   Platelets 331 150 - 400 K/uL   nRBC 0.0 0.0 - 0.2 %    Comment: Performed at [facility], [address], [place], [place] [address]  Comprehensive metabolic panel     Status: Abnormal   Collection Time: 05/14/20  6:32 AM  Result Value Ref Range   Sodium  139 135 - 145 mmol/L   Potassium 3.6 3.5 - 5.1 mmol/L   Chloride 107 98 - 111 mmol/L   CO2 25 22 - 32 mmol/L   Glucose, Bld 103 (H) 70 - 99 mg/dL    Comment: Glucose reference range applies only to samples taken after fasting for at least 8 hours.   BUN 23 8 - 23 mg/dL   Creatinine, Ser 1.12 0.61 - 1.24 mg/dL   Calcium 8.1 (L) 8.9 - 10.3 mg/dL   Total Protein 5.1 (L) 6.5 - 8.1 g/dL   Albumin 2.4 (L) 3.5 - 5.0 g/dL   AST 13 (L) 15 - 41 U/L   ALT 18 0 - 44 U/L   Alkaline Phosphatase 84 38 - 126 U/L   Total Bilirubin 1.0 0.3 - 1.2 mg/dL   GFR calc non Af Amer 57 (L) >60 mL/min   GFR calc Af Amer >60 >60 mL/min   Anion gap 7 5 - 15     Comment: Performed at [facility], [address] [place], [place] [address]  Magnesium     Status: None   Collection Time: 05/14/20  6:32 AM  Result Value Ref Range   Magnesium 1.8 1.7 - 2.4 mg/dL    Comment: Performed at [facility], [address] [place], [place] [address]     PHQ2/9: Depression screen [facility] 2/9 06/26/2020 05/02/2020 03/06/2020 02/07/2020 06/22/2019  Decreased Interest 0 0 0 0 0  Down, Depressed, Hopeless 0 0 0 0 0  PHQ - 2 Score 0 0 0 0 0  Altered sleeping 0 0 0 - 1  Tired, decreased energy 0 0 0 - 0  Change in appetite 0 0 0 - 0  Feeling bad or failure about yourself  0 0 0 - 0  Trouble concentrating 0 0 0 - 0  Moving slowly or fidgety/restless 0 0 0 - 0  Suicidal thoughts 0 0 0 - 0  PHQ-9 Score 0 0 0 - 1  Difficult doing work/chores - - Not difficult at all - Not difficult at all  Some recent data might be hidden    phq 9 is negative   Fall Risk: Fall Risk  06/26/2020 05/02/2020 03/06/2020 02/07/2020 01/17/2020  Falls in the past year? 0 0 0 0 0  Number falls in past yr: 0 - 0 0 -  Injury with Fall? 0 - 0 0 -  Risk for fall due to : - Impaired balance/gait;Impaired mobility - Impaired balance/gait;Impaired mobility -  Follow up - - - Falls prevention discussed -     Assessment & Plan  1. History of cholecystitis  Has follow up with Dr. Fleet Contras, doing well at this time  2. Moderate protein-calorie malnutrition ([place])  Discussed high calorie diet   3. Gait instability  - Ambulatory referral to Physical Therapy  4. Physical deconditioning  Continue home PT for now, he will graduate in a few weeks but will need to continue outpatient PT, family members want him to go back to his baseline  - Ambulatory referral to Physical Therapy

## 2020-06-29 ENCOUNTER — Ambulatory Visit
Admission: RE | Admit: 2020-06-29 | Discharge: 2020-06-29 | Disposition: A | Discharge status: Home / Self Care | Payer: Medicare Other | Source: Ambulatory Visit | Attending: General Surgery | Admitting: General Surgery

## 2020-06-29 ENCOUNTER — Other Ambulatory Visit: Payer: Self-pay

## 2020-06-29 DIAGNOSIS — K819 Cholecystitis, unspecified: Secondary | ICD-10-CM | POA: Diagnosis present

## 2020-06-29 MED ORDER — SODIUM CHLORIDE (PF) 0.9 % IJ SOLN
10.0000 mL | Freq: Once | INTRAMUSCULAR | Status: AC
  Administered 2020-06-29: 5 mL

## 2020-06-29 MED ORDER — IOHEXOL 180 MG/ML  SOLN
20.0000 mL | Freq: Once | INTRAMUSCULAR | Status: AC | PRN
  Administered 2020-06-29: 60 mL

## 2020-06-30 ENCOUNTER — Other Ambulatory Visit: Payer: Self-pay

## 2020-06-30 ENCOUNTER — Emergency Department
Admission: EM | Admit: 2020-06-30 | Discharge: 2020-07-01 | Disposition: A | Discharge status: Home / Self Care | Payer: Medicare Other | Attending: Emergency Medicine | Admitting: Emergency Medicine

## 2020-06-30 ENCOUNTER — Emergency Department: Payer: Medicare Other

## 2020-06-30 DIAGNOSIS — R682 Dry mouth, unspecified: Secondary | ICD-10-CM | POA: Insufficient documentation

## 2020-06-30 DIAGNOSIS — Z87891 Personal history of nicotine dependence: Secondary | ICD-10-CM | POA: Insufficient documentation

## 2020-06-30 DIAGNOSIS — Z950 Presence of cardiac pacemaker: Secondary | ICD-10-CM | POA: Diagnosis not present

## 2020-06-30 DIAGNOSIS — T8140XA Infection following a procedure, unspecified, initial encounter: Secondary | ICD-10-CM | POA: Insufficient documentation

## 2020-06-30 DIAGNOSIS — R911 Solitary pulmonary nodule: Secondary | ICD-10-CM | POA: Diagnosis not present

## 2020-06-30 DIAGNOSIS — L089 Local infection of the skin and subcutaneous tissue, unspecified: Secondary | ICD-10-CM

## 2020-06-30 DIAGNOSIS — Z79899 Other long term (current) drug therapy: Secondary | ICD-10-CM | POA: Insufficient documentation

## 2020-06-30 DIAGNOSIS — R0789 Other chest pain: Secondary | ICD-10-CM | POA: Diagnosis not present

## 2020-06-30 DIAGNOSIS — R079 Chest pain, unspecified: Secondary | ICD-10-CM | POA: Diagnosis present

## 2020-06-30 DIAGNOSIS — R918 Other nonspecific abnormal finding of lung field: Secondary | ICD-10-CM

## 2020-06-30 DIAGNOSIS — I129 Hypertensive chronic kidney disease with stage 1 through stage 4 chronic kidney disease, or unspecified chronic kidney disease: Secondary | ICD-10-CM | POA: Diagnosis not present

## 2020-06-30 DIAGNOSIS — T148XXA Other injury of unspecified body region, initial encounter: Secondary | ICD-10-CM

## 2020-06-30 DIAGNOSIS — N183 Chronic kidney disease, stage 3 unspecified: Secondary | ICD-10-CM | POA: Insufficient documentation

## 2020-06-30 LAB — CBC
HCT: 43.2 % (ref 39.0–52.0)
Hemoglobin: 14.9 g/dL (ref 13.0–17.0)
MCH: 31.6 pg (ref 26.0–34.0)
MCHC: 34.5 g/dL (ref 30.0–36.0)
MCV: 91.5 fL (ref 80.0–100.0)
Platelets: 265 10*3/uL (ref 150–400)
RBC: 4.72 MIL/uL (ref 4.22–5.81)
RDW: 13.4 % (ref 11.5–15.5)
WBC: 15.2 10*3/uL — ABNORMAL HIGH (ref 4.0–10.5)
nRBC: 0 % (ref 0.0–0.2)

## 2020-06-30 LAB — BASIC METABOLIC PANEL
Anion gap: 9 (ref 5–15)
BUN: 25 mg/dL — ABNORMAL HIGH (ref 8–23)
CO2: 28 mmol/L (ref 22–32)
Calcium: 9.1 mg/dL (ref 8.9–10.3)
Chloride: 101 mmol/L (ref 98–111)
Creatinine, Ser: 1.45 mg/dL — ABNORMAL HIGH (ref 0.61–1.24)
GFR calc Af Amer: 48 mL/min — ABNORMAL LOW (ref >60)
GFR calc non Af Amer: 42 mL/min — ABNORMAL LOW (ref >60)
Glucose, Bld: 71 mg/dL (ref 70–99)
Potassium: 3.4 mmol/L — ABNORMAL LOW (ref 3.5–5.1)
Sodium: 138 mmol/L (ref 135–145)

## 2020-06-30 LAB — TROPONIN I (HIGH SENSITIVITY): Troponin I (High Sensitivity): 20 ng/L — ABNORMAL HIGH (ref <18)

## 2020-06-30 MED ORDER — SODIUM CHLORIDE 0.9 % IV BOLUS
1000.0000 mL | Freq: Once | INTRAVENOUS | Status: AC
  Administered 2020-07-01: 1000 mL via INTRAVENOUS

## 2020-06-30 NOTE — ED Notes (Addendum)
EDP bedside.  Son bedside to assist with answering care questions, pt is HOH.  Pt resting in stretcher, NAD noted, VSS, no c/o pain from pt.

## 2020-06-30 NOTE — ED Provider Notes (Signed)
[facility] Emergency Department Provider Note   ____________________________________________   First MD Initiated Contact with Patient 06/30/20 2341     (approximate)  I have reviewed the triage vital signs and the nursing notes.   HISTORY  Chief Complaint Chest Pain    HPI Jose Castro is a 86 y.o. male brought to the ED from home by his son with a chief complaint of chest pain.  Patient has a history of hypertension, hyperlipidemia, splenic infarct, on Eliquis who was hospitalized in early June with choledocholithiasis status post cholecystostomy tube.  He spent 2 weeks in rehab and subsequently was able to return home.  His cholecystostomy tube has been capped for the past 2 weeks.  He had a cholangiogram yesterday to evaluate patency of the biliary tree.  The tube was kept in place for questionable tiny nonobstructing defects.  Son states there has been drainage around the tube insertion site.  He called the radiologist who performed the procedure and confirmed it was the color of the contrast that was administered.  Subsequently after the procedure patient began to experience chest pains across his chest which she describes as sharp and exacerbated by deep breathing and coughing.  Denies fever, chills, shortness of breath, abdominal pain, nausea or vomiting.  Has had both COVID-19 vaccinations.       Past Medical History:  Diagnosis Date   Allergy    Penicillin   Arthritis    Basal cell carcinoma    BPH (benign prostatic hyperplasia)    Chronic kidney disease    had a kidney stone which per family was a cyst that was removed   Dysrhythmia    GERD (gastroesophageal reflux disease)    History of kidney stones    Hyperlipidemia    Hypertension    Left-sided low back pain with left-sided sciatica    Splenic vein thrombosis    Stroke ([place]) 05/22/2019   Thrombosis 12/2015   mural  area and no notation of heart attack    Patient Active  Problem List   Diagnosis Date Noted   Calculus of bile duct with cholecystitis without obstruction    Abnormal findings on imaging of biliary tract    Acute cholecystitis 05/02/2020   Chronic venous insufficiency 12/05/2019   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    CKD (chronic kidney disease), stage II    New onset atrial fibrillation ([place])    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   Goals of care, counseling/discussion    Palliative care by specialist    DNR (do not resuscitate) discussion    History of CVA (cerebrovascular accident) 05/22/2019   Persistent proteinuria 07/30/2018   Mobitz type 2 second degree heart block 07/13/2018   Chronic kidney disease, stage III (moderate) 06/15/2018   Leg pain 05/06/2018   Lymphedema 05/06/2018   Coagulopathy ([place]) 07/07/2017   Lumbar spondylosis 03/10/2017   Elevated uric acid in blood 11/20/2016   BPH (benign prostatic hyperplasia) 05/29/2016   Peripheral vascular disease of lower extremity ([place]) 03/03/2016   Gallstone 12/31/2015   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   Atherosclerosis of aorta ([place]) 11/22/2015   Carotid artery narrowing 11/22/2015   Diverticulosis of colon 11/22/2015   Decreased creatinine clearance 11/22/2015   Arthritis, degenerative 11/22/2015   Lactose intolerance 11/22/2015   Basal cell carcinoma 11/22/2015   Essential hypertension 06/21/2015   Hyperlipemia 06/21/2015   GERD (gastroesophageal reflux disease) 06/21/2015  Calculus of kidney 11/18/2013    Past Surgical History:  Procedure Laterality Date   APPENDECTOMY     CATARACT EXTRACTION, BILATERAL     CYSTOSCOPY WITH INSERTION OF UROLIFT     ENDOSCOPIC RETROGRADE CHOLANGIOPANCREATOGRAPHY (ERCP) WITH PROPOFOL N/A 05/11/2020   Procedure: ENDOSCOPIC RETROGRADE CHOLANGIOPANCREATOGRAPHY (ERCP) WITH PROPOFOL;  Surgeon: [name], MD;  Location: [facility] ENDOSCOPY;   Service: Endoscopy;  Laterality: N/A;   EYE SURGERY  08/2018   IR EXCHANGE BILIARY DRAIN  05/04/2020   KIDNEY STONE SURGERY     KNEE SURGERY     PACEMAKER INSERTION N/A 07/13/2018   Procedure: INSERTION PACEMAKER-DUEL CHAMBER INITIAL IMPLANT;  Surgeon: Isaias Cowman, MD;  Location: [facility] ORS;  Service: Cardiovascular;  Laterality: N/A;   SPINE SURGERY      Prior to Admission medications   Medication Sig Start Date End Date Taking? Authorizing Provider  acetaminophen (TYLENOL) 325 MG tablet Take 2 tablets (650 mg total) by mouth every 4 (four) hours as needed for mild pain (or temp > 37.5 C (99.5 F)). Patient taking differently: Take 650 mg by mouth as needed for mild pain (or temp > 37.5 C (99.5 F)).  06/07/19   Angiulli, Lavon Paganini, PA-C  apixaban (ELIQUIS) 2.5 MG TABS tablet Take 1 tablet (2.5 mg total) by mouth 2 (two) times daily. 06/07/19   Angiulli, Lavon Paganini, PA-C  cephALEXin (KEFLEX) 250 MG capsule Take 1 capsule (250 mg total) by mouth 3 (three) times daily. 07/01/20   Paulette Blanch, MD  Cholecalciferol (VITAMIN D3) 50 MCG (2000 UT) TABS Take 2,000 Units by mouth daily. 06/07/19   Angiulli, Lavon Paganini, PA-C  Ensure Max Protein (ENSURE MAX PROTEIN) LIQD Take 330 mLs (11 oz total) by mouth 2 (two) times daily. 05/14/20   Enzo Bi, MD  finasteride (PROSCAR) 5 MG tablet Take 1 tablet (5 mg total) by mouth daily. 06/07/19   Angiulli, Lavon Paganini, PA-C  metoprolol succinate (TOPROL XL) 25 MG 24 hr tablet Take 1 tablet (25 mg total) by mouth daily. 06/07/19   Angiulli, Lavon Paganini, PA-C  Omega-3 Fatty Acids (FISH OIL) 1000 MG CAPS Take 1,000 mg by mouth daily.  Patient not taking: Reported on 06/26/2020    [provider]  ondansetron (ZOFRAN) 4 MG tablet Take 1 tablet (4 mg total) by mouth every 8 (eight) hours as needed for nausea or vomiting. Patient not taking: Reported on 06/26/2020 05/02/20   Steele Sizer, MD  pantoprazole (PROTONIX) 40 MG tablet TAKE ONE TABLET EVERY DAY 06/11/20   Steele Sizer, MD  rosuvastatin (CRESTOR) 40 MG tablet Take 1 tablet (40 mg total) by mouth daily at 6 PM. 06/07/19   Angiulli, Lavon Paganini, PA-C    Allergies Penicillins  Family History  Problem Relation Age of Onset   Cancer Brother        bladder cancer with mets   Heart disease Mother    Aortic aneurysm Mother    Heart attack Maternal Aunt    Heart attack Maternal Uncle     Social History Social History   Tobacco Use   Smoking status: Former Smoker    Packs/day: 0.00    Years: 16.00    Pack years: 0.00    Types: Cigarettes    Start date: 10/21/1944    Quit date: 10/21/1960    Years since quitting: 59.7   Smokeless tobacco: Never Used  Vaping Use   Vaping Use: Never used  Substance Use Topics   Alcohol use: Yes  Alcohol/week: 3.0 standard drinks    Types: 3 Standard drinks or equivalent per week    Comment: once a week when he goes out to dinner-socially   Drug use: No    Review of Systems  Constitutional: No fever/chills Eyes: No visual changes. ENT: No sore throat. Cardiovascular: Positive for chest pain. Respiratory: Positive for cough.  Denies shortness of breath. Gastrointestinal: No abdominal pain.  No nausea, no vomiting.  No diarrhea.  No constipation. Genitourinary: Negative for dysuria. Musculoskeletal: Negative for back pain. Skin: Negative for rash. Neurological: Negative for headaches, focal weakness or numbness.   ____________________________________________   PHYSICAL EXAM:  VITAL SIGNS: ED Triage Vitals  Enc Vitals Group     BP 06/30/20 1952 (!) 132/69     Pulse Rate 06/30/20 1952 67     Resp 06/30/20 1952 18     Temp 06/30/20 1952 99.6 F (37.6 C)     Temp Source 06/30/20 1952 Oral     SpO2 06/30/20 1952 97 %     Weight 06/30/20 1950 155 lb 10.3 oz (70.6 kg)     Height 06/30/20 1950 5\' 11"  (1.803 m)     Head Circumference --      Peak Flow --      Pain Score 06/30/20 1949 0     Pain Loc --      Pain Edu? --      Excl. in  [place]? --     Constitutional: Alert and oriented.  Elderly appearing and in no acute distress. Eyes: Conjunctivae are normal. PERRL. EOMI. Head: Atraumatic. Nose: No congestion/rhinnorhea. Mouth/Throat: Mucous membranes are mildly dry.   Neck: No stridor.   Cardiovascular: Normal rate, regular rhythm. Grossly normal heart sounds.  Good peripheral circulation. Respiratory: Normal respiratory effort.  No retractions. Lungs CTAB. Gastrointestinal: Small amount of drainage on dressing covering cholecystostomy tube insertion site.  Otherwise tube is C/D/I without erythema or warmth.  Soft and nontender to light or deep palpation. No distention. No abdominal bruits. No CVA tenderness. Musculoskeletal: No lower extremity tenderness nor edema.  No joint effusions. Neurologic:  Normal speech and language. No gross focal neurologic deficits are appreciated.  Skin:  Skin is warm, dry and intact. No rash noted. Psychiatric: Mood and affect are normal. Speech and behavior are normal.  ____________________________________________   LABS (all labs ordered are listed, but only abnormal results are displayed)  Labs Reviewed  BASIC METABOLIC PANEL - Abnormal; Notable for the following components:      Result Value   Potassium 3.4 (*)    BUN 25 (*)    Creatinine, Ser 1.45 (*)    GFR calc non Af Amer 42 (*)    GFR calc Af Amer 48 (*)    All other components within normal limits  CBC - Abnormal; Notable for the following components:   WBC 15.2 (*)    All other components within normal limits  HEPATIC FUNCTION PANEL - Abnormal; Notable for the following components:   Total Bilirubin 1.5 (*)    Bilirubin, Direct 0.3 (*)    Indirect Bilirubin 1.2 (*)    All other components within normal limits  CBC WITH DIFFERENTIAL/PLATELET - Abnormal; Notable for the following components:   WBC 15.0 (*)    Neutro Abs 10.8 (*)    Monocytes Absolute 1.9 (*)    All other components within normal limits  TROPONIN I  (HIGH SENSITIVITY) - Abnormal; Notable for the following components:   Troponin I (High Sensitivity) 20 (*)  All other components within normal limits  TROPONIN I (HIGH SENSITIVITY) - Abnormal; Notable for the following components:   Troponin I (High Sensitivity) 19 (*)    All other components within normal limits  CULTURE, BLOOD (ROUTINE X 2)  CULTURE, BLOOD (ROUTINE X 2)  LIPASE, BLOOD  LACTIC ACID, PLASMA  LACTIC ACID, PLASMA   ____________________________________________  EKG  ED ECG REPORT I, Miyani Cronic J, the attending physician, personally viewed and interpreted this ECG.   Date: 07/01/2020  EKG Time: 1945  Rate: 67  Rhythm: Paced  Axis: Paced  Intervals:Paced  ST&T Change: Paced  ____________________________________________  RADIOLOGY  ED MD interpretation: No acute cardiopulmonary process; no PE, pulmonary mass; mild surrounding inflammatory changes to cholecystostomy tube, mild proctocolitis  Official radiology report(s): DG Chest 2 View  Result Date: 06/30/2020 CLINICAL DATA:  Chest pain EXAM: CHEST - 2 VIEW COMPARISON:  Radiograph 05/01/2020 FINDINGS: Pacer pack overlies the left chest wall with leads in stable position at the right atrium and cardiac apex. Percutaneous cholecystostomy tube projects over the right upper quadrant. Mild elevation of the right hemidiaphragm similar to comparisons with some adjacent passive atelectasis. No consolidation, features of edema, pneumothorax, or effusion. The aorta is calcified. The remaining cardiomediastinal contours are unremarkable. No acute osseous or soft tissue abnormality. Degenerative changes are present in the imaged spine and shoulders. IMPRESSION: 1. No acute cardiopulmonary abnormality. 2. Stable mild elevation of the right hemidiaphragm with adjacent passive atelectasis. 3. Cholecystostomy tube in the right upper quadrant better assessed on dedicated cholecystostomy tube check performed the same day. 4.  Aortic  Atherosclerosis (ICD10-I70.0). Electronically Signed   By: Lovena Le M.D.   On: 06/30/2020 21:06   CT Angio Chest PE W/Cm &/Or Wo Cm  Result Date: 07/01/2020 CLINICAL DATA:  Chest pain and abdominal pain EXAM: CT ANGIOGRAPHY CHEST WITH CONTRAST TECHNIQUE: Multidetector CT imaging of the chest was performed using the standard protocol during bolus administration of intravenous contrast. Multiplanar CT image reconstructions and MIPs were obtained to evaluate the vascular anatomy. CONTRAST:  52mL OMNIPAQUE IOHEXOL 350 MG/ML SOLN COMPARISON:  May 02, 2020 abdomen pelvis CT, chest CT December 14, 2015 FINDINGS: Cardiovascular: There is a optimal opacification of the pulmonary arteries. There is no central,segmental, or subsegmental filling defects within the pulmonary arteries. There is mild cardiomegaly. Coronary artery and aortic valve calcifications are seen. No pericardial effusion or thickening. No evidence right heart strain. There is normal three-vessel brachiocephalic anatomy without proximal stenosis. Scattered aortic atherosclerosis is noted. Mediastinum/Nodes: No hilar, mediastinal, or axillary adenopathy. Thyroid gland, trachea, and esophagus demonstrate no significant findings. Lungs/Pleura: Ill-defined spiculated area of masslike consolidation seen in the posterior right upper lobe measuring approximately 2.9 x 1.9 cm. A small amount of ground-glass opacity seen along the right major fissure. The left lung is clear. Musculoskeletal: No chest wall abnormality. No acute or significant osseous findings. Review of the MIP images confirms the above findings. Abdomen/pelvis: Hepatobiliary: The liver is normal in density without focal abnormality.The main portal vein is patent. A percutaneous cholecystotomy tube is noted. There is bike areas contrast excretion seen within the gallbladder. There is a tiny foci of air seen adjacent to the pigtail catheter. There is mild surrounding fat stranding changes seen.  No intrahepatic biliary ductal dilatation is noted. Pancreas: Unremarkable. No pancreatic ductal dilatation or surrounding inflammatory changes. Spleen: Normal in size without focal abnormality. Adrenals/Urinary Tract: Both adrenal glands appear normal. Again noted are multiple low-density lesions seen within both kidneys. The largest measuring 3  cm within the upper pole. No hydronephrosis is seen. There is also contrast excretion seen within the bladder with small superior bladder wall diverticula. Stomach/Bowel: The stomach, small bowel, are normal in appearance. There is scattered colonic diverticulosis is seen throughout. There appears to be mild wall thickening with fluid and stool seen within the rectum. No loculated fluid collections or free air are seen. The patient is status post appendectomy. Vascular/Lymphatic: There are no enlarged mesenteric, retroperitoneal, or pelvic lymph nodes. Scattered aortic atherosclerotic calcifications are seen without aneurysmal dilatation. Reproductive: Radiation prostate seeds are noted. Other: No evidence of abdominal wall mass or hernia. Musculoskeletal: No acute or significant osseous findings. Degenerative changes are seen throughout the thoracolumbar spine. IMPRESSION: 1. No central, segmental, or subsegmental pulmonary embolism. 2. Ill-defined spiculated masslike opacity in the posterior right upper lung measuring 2.9 x 1.9 cm. This appears to have grown in size since the prior exam of 2017. This is concerning for primary lung neoplasm and would recommend PET-CT for further evaluation. 3.  Aortic Atherosclerosis (ICD10-I70.0). 4. Status post cholecystotomy tube placement with mild surrounding inflammatory changes. No intrahepatic biliary ductal dilatation. 5. Findings which could be suggestive of mild proctocolitis. Electronically Signed   By: Prudencio Pair M.D.   On: 07/01/2020 01:39   CT Abdomen Pelvis W Contrast  Result Date: 07/01/2020 CLINICAL DATA:  Chest pain  and abdominal pain EXAM: CT ANGIOGRAPHY CHEST WITH CONTRAST TECHNIQUE: Multidetector CT imaging of the chest was performed using the standard protocol during bolus administration of intravenous contrast. Multiplanar CT image reconstructions and MIPs were obtained to evaluate the vascular anatomy. CONTRAST:  28mL OMNIPAQUE IOHEXOL 350 MG/ML SOLN COMPARISON:  May 02, 2020 abdomen pelvis CT, chest CT December 14, 2015 FINDINGS: Cardiovascular: There is a optimal opacification of the pulmonary arteries. There is no central,segmental, or subsegmental filling defects within the pulmonary arteries. There is mild cardiomegaly. Coronary artery and aortic valve calcifications are seen. No pericardial effusion or thickening. No evidence right heart strain. There is normal three-vessel brachiocephalic anatomy without proximal stenosis. Scattered aortic atherosclerosis is noted. Mediastinum/Nodes: No hilar, mediastinal, or axillary adenopathy. Thyroid gland, trachea, and esophagus demonstrate no significant findings. Lungs/Pleura: Ill-defined spiculated area of masslike consolidation seen in the posterior right upper lobe measuring approximately 2.9 x 1.9 cm. A small amount of ground-glass opacity seen along the right major fissure. The left lung is clear. Musculoskeletal: No chest wall abnormality. No acute or significant osseous findings. Review of the MIP images confirms the above findings. Abdomen/pelvis: Hepatobiliary: The liver is normal in density without focal abnormality.The main portal vein is patent. A percutaneous cholecystotomy tube is noted. There is bike areas contrast excretion seen within the gallbladder. There is a tiny foci of air seen adjacent to the pigtail catheter. There is mild surrounding fat stranding changes seen. No intrahepatic biliary ductal dilatation is noted. Pancreas: Unremarkable. No pancreatic ductal dilatation or surrounding inflammatory changes. Spleen: Normal in size without focal  abnormality. Adrenals/Urinary Tract: Both adrenal glands appear normal. Again noted are multiple low-density lesions seen within both kidneys. The largest measuring 3 cm within the upper pole. No hydronephrosis is seen. There is also contrast excretion seen within the bladder with small superior bladder wall diverticula. Stomach/Bowel: The stomach, small bowel, are normal in appearance. There is scattered colonic diverticulosis is seen throughout. There appears to be mild wall thickening with fluid and stool seen within the rectum. No loculated fluid collections or free air are seen. The patient is status post appendectomy. Vascular/Lymphatic: There  are no enlarged mesenteric, retroperitoneal, or pelvic lymph nodes. Scattered aortic atherosclerotic calcifications are seen without aneurysmal dilatation. Reproductive: Radiation prostate seeds are noted. Other: No evidence of abdominal wall mass or hernia. Musculoskeletal: No acute or significant osseous findings. Degenerative changes are seen throughout the thoracolumbar spine. IMPRESSION: 1. No central, segmental, or subsegmental pulmonary embolism. 2. Ill-defined spiculated masslike opacity in the posterior right upper lung measuring 2.9 x 1.9 cm. This appears to have grown in size since the prior exam of 2017. This is concerning for primary lung neoplasm and would recommend PET-CT for further evaluation. 3.  Aortic Atherosclerosis (ICD10-I70.0). 4. Status post cholecystotomy tube placement with mild surrounding inflammatory changes. No intrahepatic biliary ductal dilatation. 5. Findings which could be suggestive of mild proctocolitis. Electronically Signed   By: Prudencio Pair M.D.   On: 07/01/2020 01:39    ____________________________________________   PROCEDURES  Procedure(s) performed (including Critical Care):  Procedures   ____________________________________________   INITIAL IMPRESSION / ASSESSMENT AND PLAN / ED COURSE  As part of my medical  decision making, I reviewed the following data within the [place] History obtained from family, Nursing notes reviewed and incorporated, Labs reviewed, EKG interpreted, Old chart reviewed, Radiograph reviewed and Notes from prior ED visits     Jose Castro was evaluated in Emergency Department on 07/01/2020 for the symptoms described in the history of present illness. He was evaluated in the context of the global COVID-19 pandemic, which necessitated consideration that the patient might be at risk for infection with the SARS-CoV-2 virus that causes COVID-19. Institutional protocols and algorithms that pertain to the evaluation of patients at risk for COVID-19 are in a state of rapid change based on information released by regulatory bodies including the CDC and federal and state organizations. These policies and algorithms were followed during the patient's care in the ED.    86 year old male with cholecystostomy tube presenting with chest pain after cholangiogram yesterday. Differential diagnosis includes, but is not limited to, ACS, aortic dissection, pulmonary embolism, cardiac tamponade, pneumothorax, pneumonia, pericarditis, myocarditis, GI-related causes including esophagitis/gastritis, and musculoskeletal chest wall pain.    Laboratory results demonstrate moderate leukocytosis, mild AKI.  Initial troponin 20 which is decreased from 2 months ago.  Will check blood cultures, lactic acid, LFTs/lipase.  Obtain CT chest given pleuritic nature of patient's pain.  Will also evaluate CT abdomen/pelvis given family's concern for intra-abdominal infection.   Clinical Course as of Jul 01 217  Sun Jul 01, 2020  9147 Updated patient's and of repeat troponin, normal lactic acid, CT negative for PE but suspicious for pulmonary nodule and inflammatory changes around cholecystostomy tube.  Will treat with Keflex (although patient has a low penicillin allergy, he was able to tolerate IV Unasyn  in 05/2020 without adverse effects) and patient will follow up closely with his PCP and surgeon.  Strict return precautions given.  Patient and his son verbalized understanding and agree with plan of care.   [JS]    Clinical Course User Index [JS] Paulette Blanch, MD     ____________________________________________   FINAL CLINICAL IMPRESSION(S) / ED DIAGNOSES  Final diagnoses:  Nonspecific chest pain  Pulmonary mass  Wound infection     ED Discharge Orders         Ordered    cephALEXin (KEFLEX) 250 MG capsule  3 times daily     Discontinue  Reprint     07/01/20 0155  Note:  This document was prepared using Dragon voice recognition software and may include unintentional dictation errors.   Paulette Blanch, MD 07/01/20 [phone]

## 2020-06-30 NOTE — ED Notes (Signed)
Son to front desk to reports pt has been having drainage from around his gallbladder drainage tub. Son states it is not like the bile that has been draining and they are worried it is infection.

## 2020-06-30 NOTE — ED Triage Notes (Addendum)
Patient reports symptoms started after they gave him "something" to make sure the drainage tube for gall bladder is working properly.  States he got some bumps on his chest and when they popped they hurt.  Patient also reports pain in chest when his coughs or sneezes.

## 2020-07-01 ENCOUNTER — Encounter: Payer: Self-pay | Admitting: Radiology

## 2020-07-01 ENCOUNTER — Emergency Department: Payer: Medicare Other

## 2020-07-01 LAB — HEPATIC FUNCTION PANEL
ALT: 18 U/L (ref 0–44)
AST: 20 U/L (ref 15–41)
Albumin: 3.5 g/dL (ref 3.5–5.0)
Alkaline Phosphatase: 70 U/L (ref 38–126)
Bilirubin, Direct: 0.3 mg/dL — ABNORMAL HIGH (ref 0.0–0.2)
Indirect Bilirubin: 1.2 mg/dL — ABNORMAL HIGH (ref 0.3–0.9)
Total Bilirubin: 1.5 mg/dL — ABNORMAL HIGH (ref 0.3–1.2)
Total Protein: 6.7 g/dL (ref 6.5–8.1)

## 2020-07-01 LAB — CBC WITH DIFFERENTIAL/PLATELET
Abs Immature Granulocytes: 0.05 10*3/uL (ref 0.00–0.07)
Basophils Absolute: 0.1 10*3/uL (ref 0.0–0.1)
Basophils Relative: 0 %
Eosinophils Absolute: 0.2 10*3/uL (ref 0.0–0.5)
Eosinophils Relative: 1 %
HCT: 45.2 % (ref 39.0–52.0)
Hemoglobin: 14.7 g/dL (ref 13.0–17.0)
Immature Granulocytes: 0 %
Lymphocytes Relative: 14 %
Lymphs Abs: 2.1 10*3/uL (ref 0.7–4.0)
MCH: 30.9 pg (ref 26.0–34.0)
MCHC: 32.5 g/dL (ref 30.0–36.0)
MCV: 95 fL (ref 80.0–100.0)
Monocytes Absolute: 1.9 10*3/uL — ABNORMAL HIGH (ref 0.1–1.0)
Monocytes Relative: 12 %
Neutro Abs: 10.8 10*3/uL — ABNORMAL HIGH (ref 1.7–7.7)
Neutrophils Relative %: 73 %
Platelets: 282 10*3/uL (ref 150–400)
RBC: 4.76 MIL/uL (ref 4.22–5.81)
RDW: 13.4 % (ref 11.5–15.5)
WBC: 15 10*3/uL — ABNORMAL HIGH (ref 4.0–10.5)
nRBC: 0 % (ref 0.0–0.2)

## 2020-07-01 LAB — LACTIC ACID, PLASMA: Lactic Acid, Venous: 1.7 mmol/L (ref 0.5–1.9)

## 2020-07-01 LAB — TROPONIN I (HIGH SENSITIVITY): Troponin I (High Sensitivity): 19 ng/L — ABNORMAL HIGH (ref <18)

## 2020-07-01 LAB — LIPASE, BLOOD: Lipase: 28 U/L (ref 11–51)

## 2020-07-01 MED ORDER — CEPHALEXIN 250 MG PO CAPS
250.0000 mg | ORAL_CAPSULE | Freq: Three times a day (TID) | ORAL | 0 refills | Status: DC
Start: 2020-07-01 — End: 2020-09-07

## 2020-07-01 MED ORDER — CEPHALEXIN 250 MG PO CAPS
250.0000 mg | ORAL_CAPSULE | Freq: Once | ORAL | Status: AC
  Administered 2020-07-01: 250 mg via ORAL
  Filled 2020-07-01: qty 1

## 2020-07-01 MED ORDER — IOHEXOL 350 MG/ML SOLN
75.0000 mL | Freq: Once | INTRAVENOUS | Status: AC | PRN
  Administered 2020-07-01: 75 mL via INTRAVENOUS

## 2020-07-01 NOTE — Discharge Instructions (Signed)
1.  Take antibiotic as prescribed (Keflex 250 mg 3 times daily x7 days). 2.  Return to the ER for worsening symptoms, persistent vomiting, difficulty breathing or other concerns.

## 2020-07-01 NOTE — ED Notes (Signed)
Patient transported to CT 

## 2020-07-01 NOTE — ED Notes (Signed)
Pt returned from CT °

## 2020-07-02 ENCOUNTER — Other Ambulatory Visit: Payer: Self-pay | Admitting: General Surgery

## 2020-07-02 DIAGNOSIS — R911 Solitary pulmonary nodule: Secondary | ICD-10-CM

## 2020-07-02 NOTE — Progress Notes (Signed)
2017 and 2021 Chest CT-A reviewed.  Vague abnormality in 2017 now pronounce nodule suggestive of lung cancer.  Candidate for SVRT if PET/ CT positive.

## 2020-07-06 LAB — CULTURE, BLOOD (ROUTINE X 2)
Culture: NO GROWTH
Culture: NO GROWTH
Special Requests: ADEQUATE

## 2020-07-10 ENCOUNTER — Other Ambulatory Visit: Payer: Self-pay

## 2020-07-10 ENCOUNTER — Ambulatory Visit
Admission: RE | Admit: 2020-07-10 | Discharge: 2020-07-10 | Disposition: A | Discharge status: Home / Self Care | Payer: Medicare Other | Source: Ambulatory Visit | Attending: General Surgery | Admitting: General Surgery

## 2020-07-10 ENCOUNTER — Other Ambulatory Visit: Payer: Self-pay | Admitting: General Surgery

## 2020-07-10 DIAGNOSIS — K8020 Calculus of gallbladder without cholecystitis without obstruction: Secondary | ICD-10-CM | POA: Diagnosis not present

## 2020-07-10 DIAGNOSIS — R911 Solitary pulmonary nodule: Secondary | ICD-10-CM | POA: Insufficient documentation

## 2020-07-10 DIAGNOSIS — R918 Other nonspecific abnormal finding of lung field: Secondary | ICD-10-CM

## 2020-07-10 LAB — GLUCOSE, CAPILLARY: Glucose-Capillary: 64 mg/dL — ABNORMAL LOW (ref 70–99)

## 2020-07-10 MED ORDER — FLUDEOXYGLUCOSE F - 18 (FDG) INJECTION
8.0000 | Freq: Once | INTRAVENOUS | Status: AC | PRN
  Administered 2020-07-10: 8.35 via INTRAVENOUS

## 2020-07-10 NOTE — Progress Notes (Signed)
radiation

## 2020-07-12 ENCOUNTER — Other Ambulatory Visit: Payer: Self-pay

## 2020-07-12 ENCOUNTER — Encounter: Payer: Self-pay | Admitting: Internal Medicine

## 2020-07-13 ENCOUNTER — Encounter: Payer: Self-pay | Admitting: Internal Medicine

## 2020-07-13 ENCOUNTER — Inpatient Hospital Stay: Payer: Medicare Other | Attending: Internal Medicine | Admitting: Internal Medicine

## 2020-07-13 ENCOUNTER — Inpatient Hospital Stay: Payer: Medicare Other

## 2020-07-13 ENCOUNTER — Encounter: Payer: Self-pay | Admitting: *Deleted

## 2020-07-13 ENCOUNTER — Other Ambulatory Visit: Payer: Self-pay | Admitting: Family Medicine

## 2020-07-13 DIAGNOSIS — Z85828 Personal history of other malignant neoplasm of skin: Secondary | ICD-10-CM | POA: Diagnosis not present

## 2020-07-13 DIAGNOSIS — N183 Chronic kidney disease, stage 3 unspecified: Secondary | ICD-10-CM | POA: Diagnosis not present

## 2020-07-13 DIAGNOSIS — K8021 Calculus of gallbladder without cholecystitis with obstruction: Secondary | ICD-10-CM | POA: Insufficient documentation

## 2020-07-13 DIAGNOSIS — E785 Hyperlipidemia, unspecified: Secondary | ICD-10-CM | POA: Diagnosis not present

## 2020-07-13 DIAGNOSIS — Z79899 Other long term (current) drug therapy: Secondary | ICD-10-CM | POA: Diagnosis not present

## 2020-07-13 DIAGNOSIS — K219 Gastro-esophageal reflux disease without esophagitis: Secondary | ICD-10-CM | POA: Diagnosis not present

## 2020-07-13 DIAGNOSIS — Z9049 Acquired absence of other specified parts of digestive tract: Secondary | ICD-10-CM | POA: Insufficient documentation

## 2020-07-13 DIAGNOSIS — R918 Other nonspecific abnormal finding of lung field: Secondary | ICD-10-CM | POA: Diagnosis not present

## 2020-07-13 DIAGNOSIS — Z87891 Personal history of nicotine dependence: Secondary | ICD-10-CM | POA: Insufficient documentation

## 2020-07-13 DIAGNOSIS — C3411 Malignant neoplasm of upper lobe, right bronchus or lung: Secondary | ICD-10-CM | POA: Diagnosis not present

## 2020-07-13 DIAGNOSIS — Z7901 Long term (current) use of anticoagulants: Secondary | ICD-10-CM | POA: Insufficient documentation

## 2020-07-13 DIAGNOSIS — R079 Chest pain, unspecified: Secondary | ICD-10-CM | POA: Insufficient documentation

## 2020-07-13 DIAGNOSIS — Z8052 Family history of malignant neoplasm of bladder: Secondary | ICD-10-CM | POA: Insufficient documentation

## 2020-07-13 DIAGNOSIS — I4891 Unspecified atrial fibrillation: Secondary | ICD-10-CM | POA: Diagnosis not present

## 2020-07-13 DIAGNOSIS — N281 Cyst of kidney, acquired: Secondary | ICD-10-CM | POA: Insufficient documentation

## 2020-07-13 DIAGNOSIS — Z880 Allergy status to penicillin: Secondary | ICD-10-CM | POA: Diagnosis not present

## 2020-07-13 DIAGNOSIS — Z8249 Family history of ischemic heart disease and other diseases of the circulatory system: Secondary | ICD-10-CM | POA: Insufficient documentation

## 2020-07-13 DIAGNOSIS — I129 Hypertensive chronic kidney disease with stage 1 through stage 4 chronic kidney disease, or unspecified chronic kidney disease: Secondary | ICD-10-CM | POA: Insufficient documentation

## 2020-07-13 DIAGNOSIS — I700 Atherosclerosis of aorta: Secondary | ICD-10-CM | POA: Insufficient documentation

## 2020-07-13 DIAGNOSIS — Z8673 Personal history of transient ischemic attack (TIA), and cerebral infarction without residual deficits: Secondary | ICD-10-CM | POA: Diagnosis not present

## 2020-07-13 DIAGNOSIS — K5730 Diverticulosis of large intestine without perforation or abscess without bleeding: Secondary | ICD-10-CM | POA: Diagnosis not present

## 2020-07-13 NOTE — Assessment & Plan Note (Addendum)
#   RUL [~3cm]; SUV 3.5- [slightly growing from [facility] 2017]-highly suspicious for malignancy based upon growth; also PET positivity.   Discussed that in general-biopsy would be recommended; however given his age/comorbidities-this could potentially be avoided given the risk of complications.  We will also review at the tumor conference.   #Discussed in general treatment of early stage lung cancer include-surgery [which I think is prohibitive given patient's age/risk factors].  Reviewed that no systemic therapy options for early stage lung cancer.  I would recommend SBRT-patient awaiting appointment with Dr. Donella Stade next week.  # Biliary obstruction- s/p biliary drain; defer to Dr.Byrnett.   # A.fib/ stroke- on Eliquis-STABLE  Thank you Dr.Byrnett for allowing me to participate in the care of your pleasant patient. Please do not hesitate to contact me with questions or concerns in the interim. Discussed with Hayley. Will review at tumor conference.   # DISPOSITION:  # follow up TBD- Dr.B  # I reviewed the blood work- with the patient in detail; also reviewed the imaging independently [as summarized above]; and with the patient in detail.

## 2020-07-13 NOTE — Progress Notes (Signed)
[place] NOTE  Patient Care Team: Steele Sizer, MD as PCP - General (Family Medicine) Royston Cowper, MD as Consulting Physician (Urology) Yolonda Kida, MD as Consulting Physician (Cardiology) Cammie Sickle, MD as Consulting Physician (Internal Medicine) Delana Meyer, Dolores Lory, MD as Consulting Physician (Vascular Surgery) Ralene Bathe, MD as Consulting Physician (Dermatology) Garvin Fila, MD as Consulting Physician (Neurology) Noreene Filbert, MD as Radiation Oncologist (Radiation Oncology)  CHIEF COMPLAINTS/PURPOSE OF CONSULTATION: LUNG cancer   Oncology History   No history exists.   # # AUG 2021- RUL lung mass [inidental;~3cm; PET- SUV- 3.5] [more consolidated from Jan 2017]  # June 2020- stroke left side of body;; Aifb [on Eliquis]  HISTORY OF PRESENTING ILLNESS:  Jose Castro 86 y.o.  male history of remote history of smoking [quit more than 60 years ago] is here for further evaluation and recommendations for lung mass/nodule.  Patient was recently admitted to hospital for acute cholecystitis status post cholecystostomy tube; also choledocholithiasis status post ERCP. patient was discharged with a cholecystostomy tube; with outpatient discussion for cholecystectomy.  In the interim patient had a CT scan of the chest that showed-right upper lobe lung mass/slightly increased or more consolidation since Jan 2017.  Patient is symptomatic from his lung nodule.  He was recently evaluated by general surgery Dr. Eulis Foster conservative management while awaiting further recommendations of lung nodule/presumed lung cancer.  Review of Systems  Constitutional: Positive for weight loss. Negative for chills, diaphoresis, fever and malaise/fatigue.  HENT: Negative for nosebleeds and sore throat.   Eyes: Negative for double vision.  Respiratory: Negative for cough, hemoptysis, sputum production, shortness of breath and wheezing.    Cardiovascular: Negative for chest pain, palpitations, orthopnea and leg swelling.  Gastrointestinal: Negative for abdominal pain, blood in stool, constipation, diarrhea, heartburn, melena, nausea and vomiting.  Genitourinary: Negative for dysuria, frequency and urgency.  Musculoskeletal: Negative for back pain and joint pain.  Skin: Negative.  Negative for itching and rash.  Neurological: Negative for dizziness, tingling, focal weakness, weakness and headaches.  Endo/Heme/Allergies: Does not bruise/bleed easily.  Psychiatric/Behavioral: Negative for depression. The patient is not nervous/anxious and does not have insomnia.   All other systems reviewed and are negative.    MEDICAL HISTORY:  Past Medical History:  Diagnosis Date  . Allergy    Penicillin  . Arthritis   . Basal cell carcinoma   . BPH (benign prostatic hyperplasia)   . Chronic kidney disease    had a kidney stone which per family was a cyst that was removed  . Dysrhythmia   . GERD (gastroesophageal reflux disease)   . History of kidney stones   . Hyperlipidemia   . Hypertension   . Left-sided low back pain with left-sided sciatica   . Lung mass   . Splenic vein thrombosis   . Stroke ([place]) 05/22/2019  . Thrombosis 12/2015   mural  area and no notation of heart attack    SURGICAL HISTORY: Past Surgical History:  Procedure Laterality Date  . APPENDECTOMY    . CATARACT EXTRACTION, BILATERAL    . CYSTOSCOPY WITH INSERTION OF UROLIFT    . ENDOSCOPIC RETROGRADE CHOLANGIOPANCREATOGRAPHY (ERCP) WITH PROPOFOL N/A 05/11/2020   Procedure: ENDOSCOPIC RETROGRADE CHOLANGIOPANCREATOGRAPHY (ERCP) WITH PROPOFOL;  Surgeon: [name], MD;  Location: [facility] ENDOSCOPY;  Service: Endoscopy;  Laterality: N/A;  . EYE SURGERY  08/2018  . IR EXCHANGE BILIARY DRAIN  05/04/2020  . KIDNEY STONE SURGERY    . KNEE SURGERY    .  PACEMAKER INSERTION N/A 07/13/2018   Procedure: INSERTION PACEMAKER-DUEL CHAMBER INITIAL IMPLANT;  Surgeon:  Isaias Cowman, MD;  Location: [facility] ORS;  Service: Cardiovascular;  Laterality: N/A;  . SPINE SURGERY      SOCIAL HISTORY: Social History   Socioeconomic History  . Marital status: Widowed    Spouse name: Not on file  . Number of children: 6  . Years of education: Not on file  . Highest education level: Some college, no degree  Occupational History  . 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 Smoking status: Former Smoker    Packs/day: 0.00    Years: 16.00    Pack years: 0.00    Types: Cigarettes    Start date: 10/21/1944    Quit date: 10/21/1960    Years since quitting: 59.7  . Smokeless tobacco: Never Used  Vaping Use  . Vaping Use: Never used  Substance and Sexual Activity  . Alcohol use: Yes    Alcohol/week: 3.0 standard drinks    Types: 3 Standard drinks or equivalent per week    Comment: once a week when he goes out to dinner-socially  . Drug use: No  . Sexual activity: Yes    Partners: Female  Other Topics Concern  . Not on file  Social History Narrative   Widow, lives alone but 4 of his children rotate staying with him since July 2020 due to stroke and he is not left alone but does most things independently.       Smoke 15-20 years; quit 60 years ago. Rare alcohol. 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    Social Determinants of Health   Financial Resource Strain: Low Risk   . Difficulty of Paying Living Expenses: Not hard at all  Food Insecurity: No Food Insecurity  . Worried About Charity fundraiser in the Last Year: Never true  . Ran Out of Food in the Last Year: Never true  Transportation Needs: No Transportation Needs  . Lack of Transportation (Medical): No  . Lack of Transportation (Non-Medical): No  Physical Activity: Inactive  . Days of Exercise per Week: 0 days  . Minutes of Exercise per Session: 0 min  Stress: No Stress Concern Present  . Feeling of Stress : Not at all  Social Connections: Moderately  Integrated  . Frequency of Communication with Friends and Family: More than three times a week  . Frequency of Social Gatherings with Friends and Family: Twice a week  . Attends Religious Services: More than 4 times per year  . Active Member of Clubs or Organizations: Yes  . Attends Archivist Meetings: More than 4 times per year  . Marital Status: Widowed  Intimate Partner Violence: Not At Risk  . Fear of Current or Ex-Partner: No  . Emotionally Abused: No  . Physically Abused: No  . Sexually Abused: No    FAMILY HISTORY: Family History  Problem Relation Age of Onset  . Cancer Brother        bladder cancer with mets  . Heart disease Mother   . Aortic aneurysm Mother   . Heart attack Maternal Aunt   . Heart attack Maternal Uncle     ALLERGIES:  is allergic to penicillins.  MEDICATIONS:  Current Outpatient Medications  Medication Sig Dispense Refill  . acetaminophen (TYLENOL) 325 MG tablet Take 2 tablets (650 mg total) by mouth every 4 (four) hours as needed for mild pain (or temp >  37.5 C (99.5 F)). (Patient taking differently: Take 650 mg by mouth as needed for mild pain (or temp > 37.5 C (99.5 F)). )    . apixaban (ELIQUIS) 2.5 MG TABS tablet Take 1 tablet (2.5 mg total) by mouth 2 (two) times daily. 60 tablet 0  . Cholecalciferol (VITAMIN D3) 50 MCG (2000 UT) TABS Take 2,000 Units by mouth daily. 30 tablet 0  . Ensure Max Protein (ENSURE MAX PROTEIN) LIQD Take 330 mLs (11 oz total) by mouth 2 (two) times daily.    . finasteride (PROSCAR) 5 MG tablet Take 1 tablet (5 mg total) by mouth daily. 30 tablet 0  . metoprolol succinate (TOPROL XL) 25 MG 24 hr tablet Take 1 tablet (25 mg total) by mouth daily. 30 tablet 0  . pantoprazole (PROTONIX) 40 MG tablet TAKE ONE TABLET EVERY DAY 90 tablet 0  . rosuvastatin (CRESTOR) 40 MG tablet Take 1 tablet (40 mg total) by mouth daily at 6 PM. 30 tablet 0  . cephALEXin (KEFLEX) 250 MG capsule Take 1 capsule (250 mg total) by mouth  3 (three) times daily. 21 capsule 0  . Omega-3 Fatty Acids (FISH OIL) 1000 MG CAPS Take 1,000 mg by mouth daily.  (Patient not taking: Reported on 06/26/2020)    . ondansetron (ZOFRAN) 4 MG tablet Take 1 tablet (4 mg total) by mouth every 8 (eight) hours as needed for nausea or vomiting. (Patient not taking: Reported on 06/26/2020) 20 tablet 0   No current facility-administered medications for this visit.      Marland Kitchen  PHYSICAL EXAMINATION: ECOG PERFORMANCE STATUS: 0 - Asymptomatic  Vitals:   07/13/20 1121  BP: 113/69  Pulse: 74  Resp: 20  Temp: (!) 97.5 F (36.4 C)  SpO2: 98%   Filed Weights   07/13/20 1121  Weight: 155 lb 9.6 oz (70.6 kg)    Physical Exam Constitutional:      Comments: Is walking with a walker.  Accompanied by his daughter.  HENT:     Head: Normocephalic and atraumatic.     Mouth/Throat:     Pharynx: No oropharyngeal exudate.  Eyes:     Pupils: Pupils are equal, round, and reactive to light.  Cardiovascular:     Rate and Rhythm: Normal rate and regular rhythm.  Pulmonary:     Effort: Pulmonary effort is normal. No respiratory distress.     Breath sounds: Normal breath sounds. No wheezing.  Abdominal:     General: Bowel sounds are normal. There is no distension.     Palpations: Abdomen is soft. There is no mass.     Tenderness: There is no abdominal tenderness. There is no guarding or rebound.     Comments: Drain in place.  Musculoskeletal:        General: No tenderness. Normal range of motion.     Cervical back: Normal range of motion and neck supple.  Skin:    General: Skin is warm.  Neurological:     Mental Status: He is alert and oriented to person, place, and time.  Psychiatric:        Mood and Affect: Affect normal.      LABORATORY DATA:  I have reviewed the data as listed Lab Results  Component Value Date   WBC 15.2 (H) 06/30/2020   WBC 15.0 (H) 06/30/2020   HGB 14.9 06/30/2020   HGB 14.7 06/30/2020   HCT 43.2 06/30/2020   HCT 45.2  06/30/2020   MCV 91.5 06/30/2020   MCV  95.0 06/30/2020   PLT 265 06/30/2020   PLT 282 06/30/2020   Recent Labs    05/04/20 0418 05/04/20 0418 05/05/20 0528 05/06/20 0710 05/13/20 0624 05/14/20 0632 06/29/20 1953 06/30/20 1953  NA 134*   < > 137   < > 136 139  --  138  K 4.0   < > 4.0   < > 3.5 3.6  --  3.4*  CL 104   < > 109   < > 106 107  --  101  CO2 22   < > 23   < > 25 25  --  28  GLUCOSE 134*   < > 112*   < > 98 103*  --  71  BUN 37*   < > 35*   < > 18 23  --  25*  CREATININE 2.17*   < > 2.19*   < > 1.04 1.12  --  1.45*  CALCIUM 7.8*   < > 8.0*   < > 7.8* 8.1*  --  9.1  GFRNONAA 26*   < > 25*   < > >60 57*  --  42*  GFRAA 30*   < > 29*   < > >60 >60  --  48*  PROT 5.1*   < > 5.0*   < > 4.8* 5.1* 6.7  --   ALBUMIN 2.5*   < > 2.4*   < > 2.2* 2.4* 3.5  --   AST 177*   < > 126*   < > 15 13* 20  --   ALT 321*   < > 238*   < > 20 18 18   --   ALKPHOS 164*   < > 186*   < > 80 84 70  --   BILITOT 4.7*   < > 2.3*   < > 1.2 1.0 1.5*  --   BILIDIR 2.8*  --  1.2*  --   --   --  0.3*  --   IBILI 1.9*  --  1.1*  --   --   --  1.2*  --    < > = values in this interval not displayed.    RADIOGRAPHIC STUDIES: I have personally reviewed the radiological images as listed and agreed with the findings in the report. DG Chest 2 View  Result Date: 06/30/2020 CLINICAL DATA:  Chest pain EXAM: CHEST - 2 VIEW COMPARISON:  Radiograph 05/01/2020 FINDINGS: Pacer pack overlies the left chest wall with leads in stable position at the right atrium and cardiac apex. Percutaneous cholecystostomy tube projects over the right upper quadrant. Mild elevation of the right hemidiaphragm similar to comparisons with some adjacent passive atelectasis. No consolidation, features of edema, pneumothorax, or effusion. The aorta is calcified. The remaining cardiomediastinal contours are unremarkable. No acute osseous or soft tissue abnormality. Degenerative changes are present in the imaged spine and shoulders.  IMPRESSION: 1. No acute cardiopulmonary abnormality. 2. Stable mild elevation of the right hemidiaphragm with adjacent passive atelectasis. 3. Cholecystostomy tube in the right upper quadrant better assessed on dedicated cholecystostomy tube check performed the same day. 4.  Aortic Atherosclerosis (ICD10-I70.0). Electronically Signed   By: Lovena Le M.D.   On: 06/30/2020 21:06   CT Angio Chest PE W/Cm &/Or Wo Cm  Result Date: 07/01/2020 CLINICAL DATA:  Chest pain and abdominal pain EXAM: CT ANGIOGRAPHY CHEST WITH CONTRAST TECHNIQUE: Multidetector CT imaging of the chest was performed using the standard protocol during bolus administration of intravenous contrast.  Multiplanar CT image reconstructions and MIPs were obtained to evaluate the vascular anatomy. CONTRAST:  40mL OMNIPAQUE IOHEXOL 350 MG/ML SOLN COMPARISON:  May 02, 2020 abdomen pelvis CT, chest CT December 14, 2015 FINDINGS: Cardiovascular: There is a optimal opacification of the pulmonary arteries. There is no central,segmental, or subsegmental filling defects within the pulmonary arteries. There is mild cardiomegaly. Coronary artery and aortic valve calcifications are seen. No pericardial effusion or thickening. No evidence right heart strain. There is normal three-vessel brachiocephalic anatomy without proximal stenosis. Scattered aortic atherosclerosis is noted. Mediastinum/Nodes: No hilar, mediastinal, or axillary adenopathy. Thyroid gland, trachea, and esophagus demonstrate no significant findings. Lungs/Pleura: Ill-defined spiculated area of masslike consolidation seen in the posterior right upper lobe measuring approximately 2.9 x 1.9 cm. A small amount of ground-glass opacity seen along the right major fissure. The left lung is clear. Musculoskeletal: No chest wall abnormality. No acute or significant osseous findings. Review of the MIP images confirms the above findings. Abdomen/pelvis: Hepatobiliary: The liver is normal in density without  focal abnormality.The main portal vein is patent. A percutaneous cholecystotomy tube is noted. There is bike areas contrast excretion seen within the gallbladder. There is a tiny foci of air seen adjacent to the pigtail catheter. There is mild surrounding fat stranding changes seen. No intrahepatic biliary ductal dilatation is noted. Pancreas: Unremarkable. No pancreatic ductal dilatation or surrounding inflammatory changes. Spleen: Normal in size without focal abnormality. Adrenals/Urinary Tract: Both adrenal glands appear normal. Again noted are multiple low-density lesions seen within both kidneys. The largest measuring 3 cm within the upper pole. No hydronephrosis is seen. There is also contrast excretion seen within the bladder with small superior bladder wall diverticula. Stomach/Bowel: The stomach, small bowel, are normal in appearance. There is scattered colonic diverticulosis is seen throughout. There appears to be mild wall thickening with fluid and stool seen within the rectum. No loculated fluid collections or free air are seen. The patient is status post appendectomy. Vascular/Lymphatic: There are no enlarged mesenteric, retroperitoneal, or pelvic lymph nodes. Scattered aortic atherosclerotic calcifications are seen without aneurysmal dilatation. Reproductive: Radiation prostate seeds are noted. Other: No evidence of abdominal wall mass or hernia. Musculoskeletal: No acute or significant osseous findings. Degenerative changes are seen throughout the thoracolumbar spine. IMPRESSION: 1. No central, segmental, or subsegmental pulmonary embolism. 2. Ill-defined spiculated masslike opacity in the posterior right upper lung measuring 2.9 x 1.9 cm. This appears to have grown in size since the prior exam of 2017. This is concerning for primary lung neoplasm and would recommend PET-CT for further evaluation. 3.  Aortic Atherosclerosis (ICD10-I70.0). 4. Status post cholecystotomy tube placement with mild  surrounding inflammatory changes. No intrahepatic biliary ductal dilatation. 5. Findings which could be suggestive of mild proctocolitis. Electronically Signed   By: Prudencio Pair M.D.   On: 07/01/2020 01:39   CT Abdomen Pelvis W Contrast  Result Date: 07/01/2020 CLINICAL DATA:  Chest pain and abdominal pain EXAM: CT ANGIOGRAPHY CHEST WITH CONTRAST TECHNIQUE: Multidetector CT imaging of the chest was performed using the standard protocol during bolus administration of intravenous contrast. Multiplanar CT image reconstructions and MIPs were obtained to evaluate the vascular anatomy. CONTRAST:  53mL OMNIPAQUE IOHEXOL 350 MG/ML SOLN COMPARISON:  May 02, 2020 abdomen pelvis CT, chest CT December 14, 2015 FINDINGS: Cardiovascular: There is a optimal opacification of the pulmonary arteries. There is no central,segmental, or subsegmental filling defects within the pulmonary arteries. There is mild cardiomegaly. Coronary artery and aortic valve calcifications are seen. No pericardial effusion or  thickening. No evidence right heart strain. There is normal three-vessel brachiocephalic anatomy without proximal stenosis. Scattered aortic atherosclerosis is noted. Mediastinum/Nodes: No hilar, mediastinal, or axillary adenopathy. Thyroid gland, trachea, and esophagus demonstrate no significant findings. Lungs/Pleura: Ill-defined spiculated area of masslike consolidation seen in the posterior right upper lobe measuring approximately 2.9 x 1.9 cm. A small amount of ground-glass opacity seen along the right major fissure. The left lung is clear. Musculoskeletal: No chest wall abnormality. No acute or significant osseous findings. Review of the MIP images confirms the above findings. Abdomen/pelvis: Hepatobiliary: The liver is normal in density without focal abnormality.The main portal vein is patent. A percutaneous cholecystotomy tube is noted. There is bike areas contrast excretion seen within the gallbladder. There is a tiny foci  of air seen adjacent to the pigtail catheter. There is mild surrounding fat stranding changes seen. No intrahepatic biliary ductal dilatation is noted. Pancreas: Unremarkable. No pancreatic ductal dilatation or surrounding inflammatory changes. Spleen: Normal in size without focal abnormality. Adrenals/Urinary Tract: Both adrenal glands appear normal. Again noted are multiple low-density lesions seen within both kidneys. The largest measuring 3 cm within the upper pole. No hydronephrosis is seen. There is also contrast excretion seen within the bladder with small superior bladder wall diverticula. Stomach/Bowel: The stomach, small bowel, are normal in appearance. There is scattered colonic diverticulosis is seen throughout. There appears to be mild wall thickening with fluid and stool seen within the rectum. No loculated fluid collections or free air are seen. The patient is status post appendectomy. Vascular/Lymphatic: There are no enlarged mesenteric, retroperitoneal, or pelvic lymph nodes. Scattered aortic atherosclerotic calcifications are seen without aneurysmal dilatation. Reproductive: Radiation prostate seeds are noted. Other: No evidence of abdominal wall mass or hernia. Musculoskeletal: No acute or significant osseous findings. Degenerative changes are seen throughout the thoracolumbar spine. IMPRESSION: 1. No central, segmental, or subsegmental pulmonary embolism. 2. Ill-defined spiculated masslike opacity in the posterior right upper lung measuring 2.9 x 1.9 cm. This appears to have grown in size since the prior exam of 2017. This is concerning for primary lung neoplasm and would recommend PET-CT for further evaluation. 3.  Aortic Atherosclerosis (ICD10-I70.0). 4. Status post cholecystotomy tube placement with mild surrounding inflammatory changes. No intrahepatic biliary ductal dilatation. 5. Findings which could be suggestive of mild proctocolitis. Electronically Signed   By: Prudencio Pair M.D.   On:  07/01/2020 01:39   NM PET Image Initial (PI) Skull Base To Thigh  Result Date: 07/10/2020 CLINICAL DATA:  Initial treatment strategy for solitary pulmonary nodule. EXAM: NUCLEAR MEDICINE PET SKULL BASE TO THIGH TECHNIQUE: 8.4 mCi F-18 FDG was injected intravenously. Full-ring PET imaging was performed from the skull base to thigh after the radiotracer. CT data was obtained and used for attenuation correction and anatomic localization. Fasting blood glucose: 64 mg/dl COMPARISON:  CTA chest CT abdomen/pelvis dated 07/01/2020 FINDINGS: Mediastinal blood pool activity: SUV max 2.0 Liver activity: SUV max NA NECK: No hypermetabolic cervical lymphadenopathy. Incidental CT findings: none CHEST: 1.7 x 3.0 cm irregular posterior right upper lobe mass (series 3/image 77), max SUV 3.7, suspicious for primary bronchogenic neoplasm. No hypermetabolic thoracic lymphadenopathy. Incidental CT findings: Atherosclerotic calcifications of the aortic arch. 3 vessel coronary atherosclerosis. Left subclavian pacemaker. ABDOMEN/PELVIS: No abnormal hypermetabolism in the liver, spleen, pancreas, or adrenal glands. Percutaneous cholecystostomy with scattered foci of peripheral hypermetabolism along the gallbladder, max SUV 7.5, favored to be postprocedural. Layering tiny gallstones (series 3/image 149). No hypermetabolic abdominopelvic lymphadenopathy. Incidental CT findings: Atherosclerotic calcifications abdominal  aorta and branch vessels. Left colonic diverticulosis, without evidence of diverticulitis. Bilateral renal cysts, including a 10 mm hemorrhagic cyst in the posterior left lower kidney. Prostatomegaly with brachytherapy seeds. SKELETON: No focal hypermetabolic activity to suggest skeletal metastasis. Incidental CT findings: Degenerative changes the visualized thoracolumbar spine. IMPRESSION: 3.0 cm posterior right upper lobe mass, suspicious for primary bronchogenic neoplasm. No findings suspicious for metastatic disease.  Percutaneous cholecystostomy with suspected postprocedural inflammation. Cholelithiasis. Brachytherapy seeds in the prostate. Additional ancillary findings as above. Electronically Signed   By: Julian Hy M.D.   On: 07/10/2020 15:43   DG CHOLANGIOGRAM  EXISTING TUBE  Result Date: 06/29/2020 INDICATION: Cholecystostomy tube check. EXAM: CHOLECYSTOSTOMY TUBE CHECK. MEDICATIONS: None. ANESTHESIA/SEDATION: None. FLUOROSCOPY TIME:  Fluoroscopy Time: 3 minutes 18 seconds (min 8.4 mGy mGy). COMPLICATIONS: None immediate. PROCEDURE: Risks and benefits of this procedure were discussed with the patient. Scout film obtained revealed good tube positioning. Contrast was gently administered into the gallbladder. Very tiny filling defects in the cystic duct and in the distal common bile duct cannot be completely excluded. No large obstructing filling defects are noted. Contrast flowed into the duodenum. Given the presence of the questionable tiny filling defects the tube was not removed the. Tube was recapped. The patient was again instructed to phone his surgeon and to place the tube to bag drainage if he developed symptoms such as fever or pain. Follow-up exam in 1 week can be obtained and if the biliary system is cleared the tube can be removed. IMPRESSION: 1. Cholecystostomy tube in good anatomic position. Gallbladder drained easily into the common bile duct and duodenum. 2. Questionable tiny nonobstructing defect noted in the cystic duct. Questionable small tiny nonobstructing defect noted in the distal common bile duct at the ampulla. Given these tiny questionable filling defect the tube was not removed. The tube was recapped. Postprocedure care discussed with the patient. Follow-up exam can be obtained in 1 week and if the biliary system is cleared the tube can be removed. Electronically Signed   By: Marcello Moores  Register   On: 06/29/2020 11:39    ASSESSMENT & PLAN:   Mass of upper lobe of right lung # RUL  [~3cm]; SUV 3.5- [slightly growing from [place] 2017]-highly suspicious for malignancy based upon growth; also PET positivity.   Discussed that in general-biopsy would be recommended; however given his age/comorbidities-this could potentially be avoided given the risk of complications.  We will also review at the tumor conference.   #Discussed in general treatment of early stage lung cancer include-surgery [which I think is prohibitive given patient's age/risk factors].  Reviewed that no systemic therapy options for early stage lung cancer.  I would recommend SBRT-patient awaiting appointment with Dr. Donella Stade next week.  # Biliary obstruction- s/p biliary drain; defer to Dr.Byrnett.   # A.fib/ stroke- on Eliquis-STABLE  Thank you Dr.Byrnett for allowing me to participate in the care of your pleasant patient. Please do not hesitate to contact me with questions or concerns in the interim. Discussed with Hayley. Will review at tumor conference.   # DISPOSITION:  # follow up TBD- Dr.B  # I reviewed the blood work- with the patient in detail; also reviewed the imaging independently [as summarized above]; and with the patient in detail.    All questions were answered. The patient knows to call the clinic with any problems, questions or concerns.    Cammie Sickle, MD 07/13/2020 3:38 PM

## 2020-07-13 NOTE — Progress Notes (Signed)
Oncology Nurse Navigator Documentation  Navigator Location: [facility] (07/13/20 1400) Referral Date to RadOnc/MedOnc: 07/11/20 (07/13/20 1400) )Navigator Encounter Type: Initial MedOnc (07/13/20 1400)   Abnormal Finding Date: 07/01/20 (07/13/20 1400)                   Treatment Phase: Abnormal Scans (07/13/20 1400) Barriers/Navigation Needs: Coordination of Care (07/13/20 1400)   Interventions: Coordination of Care;Referrals (07/13/20 1400) Referrals: Radiation Oncology (07/13/20 1400) Coordination of Care: Appts (07/13/20 1400)        Acuity: Level 2-Minimal Needs (1-2 Barriers Identified) (07/13/20 1400)  met with patient and his daughter during initial consult visit with Dr. Rogue Bussing. All questions answered during visit. Reviewed upcoming appts. Contact info given and instructed to call with any further questions or needs. Pt and his daughter verbalized understanding.        Time Spent with Patient: 60 (07/13/20 1400)

## 2020-07-17 ENCOUNTER — Encounter: Payer: Self-pay | Admitting: Radiation Oncology

## 2020-07-17 ENCOUNTER — Ambulatory Visit
Admission: RE | Admit: 2020-07-17 | Discharge: 2020-07-17 | Disposition: A | Discharge status: Home / Self Care | Payer: Medicare Other | Source: Ambulatory Visit | Attending: Radiation Oncology | Admitting: Radiation Oncology

## 2020-07-17 ENCOUNTER — Encounter: Payer: Self-pay | Admitting: *Deleted

## 2020-07-17 ENCOUNTER — Other Ambulatory Visit: Payer: Self-pay

## 2020-07-17 VITALS — BP 117/77 | HR 83 | Temp 97.5°F | Wt 157.0 lb

## 2020-07-17 DIAGNOSIS — I129 Hypertensive chronic kidney disease with stage 1 through stage 4 chronic kidney disease, or unspecified chronic kidney disease: Secondary | ICD-10-CM | POA: Diagnosis not present

## 2020-07-17 DIAGNOSIS — Z85828 Personal history of other malignant neoplasm of skin: Secondary | ICD-10-CM | POA: Diagnosis not present

## 2020-07-17 DIAGNOSIS — Z87891 Personal history of nicotine dependence: Secondary | ICD-10-CM | POA: Insufficient documentation

## 2020-07-17 DIAGNOSIS — C3411 Malignant neoplasm of upper lobe, right bronchus or lung: Secondary | ICD-10-CM | POA: Insufficient documentation

## 2020-07-17 DIAGNOSIS — M129 Arthropathy, unspecified: Secondary | ICD-10-CM | POA: Diagnosis not present

## 2020-07-17 DIAGNOSIS — N400 Enlarged prostate without lower urinary tract symptoms: Secondary | ICD-10-CM | POA: Insufficient documentation

## 2020-07-17 DIAGNOSIS — M5442 Lumbago with sciatica, left side: Secondary | ICD-10-CM | POA: Diagnosis not present

## 2020-07-17 DIAGNOSIS — I10 Essential (primary) hypertension: Secondary | ICD-10-CM | POA: Diagnosis not present

## 2020-07-17 DIAGNOSIS — K219 Gastro-esophageal reflux disease without esophagitis: Secondary | ICD-10-CM | POA: Diagnosis not present

## 2020-07-17 DIAGNOSIS — E785 Hyperlipidemia, unspecified: Secondary | ICD-10-CM | POA: Diagnosis not present

## 2020-07-17 DIAGNOSIS — I82890 Acute embolism and thrombosis of other specified veins: Secondary | ICD-10-CM | POA: Diagnosis not present

## 2020-07-17 DIAGNOSIS — Z8673 Personal history of transient ischemic attack (TIA), and cerebral infarction without residual deficits: Secondary | ICD-10-CM | POA: Insufficient documentation

## 2020-07-17 DIAGNOSIS — Z7901 Long term (current) use of anticoagulants: Secondary | ICD-10-CM | POA: Insufficient documentation

## 2020-07-17 DIAGNOSIS — R918 Other nonspecific abnormal finding of lung field: Secondary | ICD-10-CM

## 2020-07-17 DIAGNOSIS — Z79899 Other long term (current) drug therapy: Secondary | ICD-10-CM | POA: Insufficient documentation

## 2020-07-17 NOTE — Consult Note (Signed)
NEW PATIENT EVALUATION  Name: Jose Castro  MRN: [id number]  Date:   07/17/2020     DOB: 03-19-1926   This 86 y.o. male patient presents to the clinic for initial evaluation of stage I non-small cell lung cancer of the posterior portion of the right upper lobe for SBRT.  REFERRING PHYSICIAN: Steele Sizer, MD  CHIEF COMPLAINT:  Chief Complaint  Patient presents with   Consult    DIAGNOSIS: The encounter diagnosis was Mass of upper lobe of right lung.   PREVIOUS INVESTIGATIONS:  CT scans and PET CT scans reviewed Clinical notes reviewed Labs reviewed  HPI: Patient is a 86 year old male who was admitted to the hospital for acute cholecystitis and cholelithiasis status post ERCP.  Part of his work-up including a CT scan of the chest i showed a right upper lobe posterior nodule which had progressed since 2017.  Patient is asymptomatic specifically denies cough hemoptysis or chest tightness.  PET CT scan was performed showing hypermetabolic 3 cm posterior right upper lobe mass suspicious for primary bronchogenic carcinoma.  He has been seen by surgeon and recommendation for either radiation therapy or observation was made.  Patient is seen today with his daughter for consideration of treatment.  He has no other evidence of PET CT scan of distant disease or mediastinal adenopathy.  PLANNED TREATMENT REGIMEN: SBRT  PAST MEDICAL HISTORY:  has a past medical history of Allergy, Arthritis, Basal cell carcinoma, BPH (benign prostatic hyperplasia), Chronic kidney disease, Dysrhythmia, GERD (gastroesophageal reflux disease), History of kidney stones, Hyperlipidemia, Hypertension, Left-sided low back pain with left-sided sciatica, Lung mass, Splenic vein thrombosis, Stroke ([place]) (05/22/2019), and Thrombosis (12/2015).    PAST SURGICAL HISTORY:  Past Surgical History:  Procedure Laterality Date   APPENDECTOMY     CATARACT EXTRACTION, BILATERAL     CYSTOSCOPY WITH INSERTION OF UROLIFT      ENDOSCOPIC RETROGRADE CHOLANGIOPANCREATOGRAPHY (ERCP) WITH PROPOFOL N/A 05/11/2020   Procedure: ENDOSCOPIC RETROGRADE CHOLANGIOPANCREATOGRAPHY (ERCP) WITH PROPOFOL;  Surgeon: [name], MD;  Location: [facility] ENDOSCOPY;  Service: Endoscopy;  Laterality: N/A;   EYE SURGERY  08/2018   IR EXCHANGE BILIARY DRAIN  05/04/2020   KIDNEY STONE SURGERY     KNEE SURGERY     PACEMAKER INSERTION N/A 07/13/2018   Procedure: INSERTION PACEMAKER-DUEL CHAMBER INITIAL IMPLANT;  Surgeon: Isaias Cowman, MD;  Location: [facility] ORS;  Service: Cardiovascular;  Laterality: N/A;   SPINE SURGERY      FAMILY HISTORY: family history includes Aortic aneurysm in his mother; Cancer in his brother; Heart attack in his maternal aunt and maternal uncle; Heart disease in his mother.  SOCIAL HISTORY:  reports that he quit smoking about 59 years ago. His smoking use included cigarettes. He started smoking about 75 years ago. He smoked 0.00 packs per day for 16.00 years. He has never used smokeless tobacco. He reports current alcohol use of about 3.0 standard drinks of alcohol per week. He reports that he does not use drugs.  ALLERGIES: Penicillins  MEDICATIONS:  Current Outpatient Medications  Medication Sig Dispense Refill   acetaminophen (TYLENOL) 325 MG tablet Take 2 tablets (650 mg total) by mouth every 4 (four) hours as needed for mild pain (or temp > 37.5 C (99.5 F)). (Patient taking differently: Take 650 mg by mouth as needed for mild pain (or temp > 37.5 C (99.5 F)). )     apixaban (ELIQUIS) 2.5 MG TABS tablet Take 1 tablet (2.5 mg total) by mouth 2 (two) times daily. 60 tablet 0  cephALEXin (KEFLEX) 250 MG capsule Take 1 capsule (250 mg total) by mouth 3 (three) times daily. 21 capsule 0   Cholecalciferol (VITAMIN D3) 50 MCG (2000 UT) TABS Take 2,000 Units by mouth daily. 30 tablet 0   Ensure Max Protein (ENSURE MAX PROTEIN) LIQD Take 330 mLs (11 oz total) by mouth 2 (two) times daily.     finasteride  (PROSCAR) 5 MG tablet Take 1 tablet (5 mg total) by mouth daily. 30 tablet 0   metoprolol succinate (TOPROL XL) 25 MG 24 hr tablet Take 1 tablet (25 mg total) by mouth daily. 30 tablet 0   pantoprazole (PROTONIX) 40 MG tablet TAKE ONE TABLET EVERY DAY 90 tablet 0   rosuvastatin (CRESTOR) 40 MG tablet Take 1 tablet (40 mg total) by mouth daily at 6 PM. 30 tablet 0   ursodiol (ACTIGALL) 300 MG capsule Take 300 mg by mouth in the morning and at bedtime.     Omega-3 Fatty Acids (FISH OIL) 1000 MG CAPS Take 1,000 mg by mouth daily.  (Patient not taking: Reported on 06/26/2020)     ondansetron (ZOFRAN) 4 MG tablet Take 1 tablet (4 mg total) by mouth every 8 (eight) hours as needed for nausea or vomiting. (Patient not taking: Reported on 06/26/2020) 20 tablet 0   No current facility-administered medications for this encounter.    ECOG PERFORMANCE STATUS:  0 - Asymptomatic  REVIEW OF SYSTEMS: Patient denies any weight loss, fatigue, weakness, fever, chills or night sweats. Patient denies any loss of vision, blurred vision. Patient denies any ringing  of the ears or hearing loss. No irregular heartbeat. Patient denies heart murmur or history of fainting. Patient denies any chest pain or pain radiating to her upper extremities. Patient denies any shortness of breath, difficulty breathing at night, cough or hemoptysis. Patient denies any swelling in the lower legs. Patient denies any nausea vomiting, vomiting of blood, or coffee ground material in the vomitus. Patient denies any stomach pain. Patient states has had normal bowel movements no significant constipation or diarrhea. Patient denies any dysuria, hematuria or significant nocturia. Patient denies any problems walking, swelling in the joints or loss of balance. Patient denies any skin changes, loss of hair or loss of weight. Patient denies any excessive worrying or anxiety or significant depression. Patient denies any problems with insomnia. Patient  denies excessive thirst, polyuria, polydipsia. Patient denies any swollen glands, patient denies easy bruising or easy bleeding. Patient denies any recent infections, allergies or URI. Patient "s visual fields have not changed significantly in recent time.   PHYSICAL EXAM: BP 117/77 (BP Location: Left Arm, Patient Position: Sitting, Cuff Size: Normal)    Pulse 83    Temp (!) 97.5 F (36.4 C) (Tympanic)    Wt 157 lb (71.2 kg)    BMI 21.90 kg/m  Elderly male in NAD appears younger than stated age.  Well-developed well-nourished patient in NAD. HEENT reveals PERLA, EOMI, discs not visualized.  Oral cavity is clear. No oral mucosal lesions are identified. Neck is clear without evidence of cervical or supraclavicular adenopathy. Lungs are clear to A&P. Cardiac examination is essentially unremarkable with regular rate and rhythm without murmur rub or thrill. Abdomen is benign with no organomegaly or masses noted. Motor sensory and DTR levels are equal and symmetric in the upper and lower extremities. Cranial nerves II through XII are grossly intact. Proprioception is intact. No peripheral adenopathy or edema is identified. No motor or sensory levels are noted. Crude visual fields are  within normal range.  Patient does have a pacemaker placed in his left anterior chest wall  LABORATORY DATA: Labs are reviewed    RADIOLOGY RESULTS: Serial CT scans and PET CT scans reviewed compatible with above-stated findings   IMPRESSION: Stage I non-small cell lung cancer of the right upper lobe in 86 year old male for SBRT  PLAN: At this time I have recommended based on his advanced age and ability to do surgery I would perform SBRT to his right upper lobe nodule.  I would plan on delivering 6000 cGy in 5 fractions.  I would use motion restriction during treatment planning as well as PET CT fusion study.  Risks and benefits of treatment including possible development of cough slight chance of fatigue and very little  chance of other side effects all were reviewed in detail with the patient and his daughter.  They both seem to comprehend my treatment plan well.  I have personally set up and ordered CT simulation for next week.  I would like to take this opportunity to thank you for allowing me to participate in the care of your patient.Noreene Filbert, MD

## 2020-07-17 NOTE — Progress Notes (Signed)
  Oncology Nurse Navigator Documentation  Navigator Location: [facility] (07/17/20 1300)   )Navigator Encounter Type: Appt/Treatment Plan Review (07/17/20 1300)     Confirmed Diagnosis Date: 07/10/20 (07/17/20 1300)                 Treatment Phase: Pre-Tx/Tx Discussion (07/17/20 1300) Barriers/Navigation Needs: No Barriers At This Time (07/17/20 1300)   Interventions: None Required (07/17/20 1300)           Appt/treatment plan review. Will follow up with patient at simulation visit next week.            Time Spent with Patient: 30 (07/17/20 1300)

## 2020-07-19 ENCOUNTER — Other Ambulatory Visit: Payer: Medicare Other

## 2020-07-19 NOTE — Progress Notes (Signed)
Tumor Board Documentation  Jose Castro was presented by Dr Rogue Bussing at our Tumor Board on 07/19/2020, which included representatives from medical oncology, radiation oncology, surgical oncology, internal medicine, navigation, pathology, radiology, surgical, pharmacy, research, palliative care, pulmonology.  Jose Castro currently presents as a new patient, for discussion with history of the following treatments: active survellience.  Additionally, we reviewed previous medical and familial history, history of present illness, and recent lab results along with all available histopathologic and imaging studies. The tumor board considered available treatment options and made the following recommendations: Radiation therapy (primary modality)    The following procedures/referrals were also placed: No orders of the defined types were placed in this encounter.   Clinical Trial Status: not discussed   Staging used: Clinical Stage  AJCC Staging:       Group: Lung Cancer  National site-specific guidelines   were discussed with respect to the case.  Tumor board is a meeting of clinicians from various specialty areas who evaluate and discuss patients for whom a multidisciplinary approach is being considered. Final determinations in the plan of care are those of the provider(s). The responsibility for follow up of recommendations given during tumor board is that of the provider.   Today's extended care, comprehensive team conference, Jose Castro was not present for the discussion and was not examined.   Multidisciplinary Tumor Board is a multidisciplinary case peer review process.  Decisions discussed in the Multidisciplinary Tumor Board reflect the opinions of the specialists present at the conference without having examined the patient.  Ultimately, treatment and diagnostic decisions rest with the primary provider(s) and the patient.

## 2020-07-25 ENCOUNTER — Encounter: Payer: Self-pay | Admitting: *Deleted

## 2020-07-25 ENCOUNTER — Ambulatory Visit
Admission: RE | Admit: 2020-07-25 | Discharge: 2020-07-25 | Disposition: A | Discharge status: Home / Self Care | Payer: Medicare Other | Source: Ambulatory Visit | Attending: Radiation Oncology | Admitting: Radiation Oncology

## 2020-07-25 DIAGNOSIS — R918 Other nonspecific abnormal finding of lung field: Secondary | ICD-10-CM | POA: Diagnosis present

## 2020-07-25 DIAGNOSIS — Z510 Encounter for antineoplastic radiation therapy: Secondary | ICD-10-CM | POA: Insufficient documentation

## 2020-07-25 NOTE — Progress Notes (Signed)
  Oncology Nurse Navigator Documentation  Navigator Location: [facility] (07/25/20 1100)   )Navigator Encounter Type: Lobby (07/25/20 1100)                     Patient Visit Type: GYLUDA (07/25/20 1100) Treatment Phase: CT SIM (07/25/20 1100) Barriers/Navigation Needs: No Barriers At This Time;No Needs (07/25/20 1100)   Interventions: None Required (07/25/20 1100)             met with patient and his daughter in the lobby prior to CT sim today. All questions answered during visit. Pt did not have any needs or barriers at this time. Instructed to call with any further questions or needs. Pt and his daughter verbalized understanding.          Time Spent with Patient: 30 (07/25/20 1100)

## 2020-07-27 ENCOUNTER — Other Ambulatory Visit: Payer: Self-pay | Admitting: *Deleted

## 2020-07-27 DIAGNOSIS — R911 Solitary pulmonary nodule: Secondary | ICD-10-CM

## 2020-07-30 ENCOUNTER — Other Ambulatory Visit: Payer: Self-pay | Admitting: Family Medicine

## 2020-08-01 ENCOUNTER — Encounter: Payer: Self-pay | Admitting: Family Medicine

## 2020-08-01 DIAGNOSIS — Z510 Encounter for antineoplastic radiation therapy: Secondary | ICD-10-CM | POA: Insufficient documentation

## 2020-08-01 DIAGNOSIS — R918 Other nonspecific abnormal finding of lung field: Secondary | ICD-10-CM | POA: Insufficient documentation

## 2020-08-03 ENCOUNTER — Telehealth: Payer: Self-pay

## 2020-08-03 NOTE — Telephone Encounter (Signed)
Copied from [place] [phone]. Topic: General - Inquiry >> Jul 30, 2020  4:37 PM Percell Belt A wrote: Reason for CRM: pt daughter called in and stated pt is having some Dirreha with the new med (ursodiol (ACTIGALL) 300 MG capsule [[id number]] ) and would like to know what he can do or something to help?   Best number [phone] [facility] >> Aug 01, 2020 11:26 AM Yvette Rack wrote: Pt daughter Khy Pitre called back very upset that no one has contacted pt regarding her request for pt to receive Rx for diarrhea medication. [name] stated if no one calls them today she will drive up to the office.

## 2020-08-08 ENCOUNTER — Ambulatory Visit
Admission: RE | Admit: 2020-08-08 | Discharge: 2020-08-08 | Disposition: A | Discharge status: Home / Self Care | Payer: Medicare Other | Source: Ambulatory Visit | Attending: Radiation Oncology | Admitting: Radiation Oncology

## 2020-08-08 ENCOUNTER — Encounter: Payer: Self-pay | Admitting: *Deleted

## 2020-08-08 DIAGNOSIS — R918 Other nonspecific abnormal finding of lung field: Secondary | ICD-10-CM | POA: Diagnosis not present

## 2020-08-08 NOTE — Progress Notes (Signed)
Oncology Nurse Navigator Documentation  Navigator Location: [facility] (08/08/20 1300)   )Navigator Encounter Type: Lobby (08/08/20 1300)                   Treatment Initiated Date: 08/08/20 (08/08/20 1300) Patient Visit Type: JJOACZ (08/08/20 1300) Treatment Phase: First Radiation Tx (08/08/20 1300) Barriers/Navigation Needs: No Barriers At This Time (08/08/20 1300)   Interventions: None Required (08/08/20 1300)               met with pt's daughter in the lobby during radiation treatment. All questions answered during visit. Given appts to follow up with Dr. Rogue Bussing. Nothing further needed at this time.       Time Spent with Patient: 30 (08/08/20 1300)

## 2020-08-10 ENCOUNTER — Ambulatory Visit
Admission: RE | Admit: 2020-08-10 | Discharge: 2020-08-10 | Disposition: A | Discharge status: Home / Self Care | Payer: Medicare Other | Source: Ambulatory Visit | Attending: Radiation Oncology | Admitting: Radiation Oncology

## 2020-08-10 ENCOUNTER — Encounter: Payer: Self-pay | Admitting: Family Medicine

## 2020-08-10 DIAGNOSIS — R918 Other nonspecific abnormal finding of lung field: Secondary | ICD-10-CM | POA: Diagnosis not present

## 2020-08-13 ENCOUNTER — Ambulatory Visit
Admission: RE | Admit: 2020-08-13 | Discharge: 2020-08-13 | Disposition: A | Discharge status: Home / Self Care | Payer: Medicare Other | Source: Ambulatory Visit | Attending: Radiation Oncology | Admitting: Radiation Oncology

## 2020-08-13 DIAGNOSIS — R918 Other nonspecific abnormal finding of lung field: Secondary | ICD-10-CM | POA: Diagnosis not present

## 2020-08-15 ENCOUNTER — Ambulatory Visit
Admission: RE | Admit: 2020-08-15 | Discharge: 2020-08-15 | Disposition: A | Discharge status: Home / Self Care | Payer: Medicare Other | Source: Ambulatory Visit | Attending: Radiation Oncology | Admitting: Radiation Oncology

## 2020-08-15 DIAGNOSIS — R918 Other nonspecific abnormal finding of lung field: Secondary | ICD-10-CM | POA: Diagnosis not present

## 2020-08-17 ENCOUNTER — Ambulatory Visit: Payer: Medicare Other

## 2020-08-20 ENCOUNTER — Ambulatory Visit
Admission: RE | Admit: 2020-08-20 | Discharge: 2020-08-20 | Disposition: A | Discharge status: Home / Self Care | Payer: Medicare Other | Source: Ambulatory Visit | Attending: Radiation Oncology | Admitting: Radiation Oncology

## 2020-08-20 DIAGNOSIS — R918 Other nonspecific abnormal finding of lung field: Secondary | ICD-10-CM | POA: Diagnosis not present

## 2020-08-22 ENCOUNTER — Ambulatory Visit: Payer: Medicare Other

## 2020-09-07 ENCOUNTER — Ambulatory Visit (INDEPENDENT_AMBULATORY_CARE_PROVIDER_SITE_OTHER): Payer: Medicare Other | Admitting: Family Medicine

## 2020-09-07 ENCOUNTER — Other Ambulatory Visit: Payer: Self-pay

## 2020-09-07 ENCOUNTER — Encounter: Payer: Self-pay | Admitting: Family Medicine

## 2020-09-07 VITALS — BP 104/60 | HR 79 | Temp 97.5°F | Resp 14 | Ht 71.0 in | Wt 158.7 lb

## 2020-09-07 DIAGNOSIS — I739 Peripheral vascular disease, unspecified: Secondary | ICD-10-CM

## 2020-09-07 DIAGNOSIS — N1831 Chronic kidney disease, stage 3a: Secondary | ICD-10-CM

## 2020-09-07 DIAGNOSIS — R2681 Unsteadiness on feet: Secondary | ICD-10-CM | POA: Diagnosis not present

## 2020-09-07 DIAGNOSIS — D692 Other nonthrombocytopenic purpura: Secondary | ICD-10-CM | POA: Diagnosis not present

## 2020-09-07 DIAGNOSIS — E440 Moderate protein-calorie malnutrition: Secondary | ICD-10-CM | POA: Diagnosis not present

## 2020-09-07 DIAGNOSIS — I69354 Hemiplegia and hemiparesis following cerebral infarction affecting left non-dominant side: Secondary | ICD-10-CM

## 2020-09-07 DIAGNOSIS — Z23 Encounter for immunization: Secondary | ICD-10-CM | POA: Diagnosis not present

## 2020-09-07 DIAGNOSIS — C3491 Malignant neoplasm of unspecified part of right bronchus or lung: Secondary | ICD-10-CM | POA: Insufficient documentation

## 2020-09-07 DIAGNOSIS — I700 Atherosclerosis of aorta: Secondary | ICD-10-CM

## 2020-09-07 DIAGNOSIS — I10 Essential (primary) hypertension: Secondary | ICD-10-CM

## 2020-09-07 NOTE — Progress Notes (Signed)
Name: Jose Castro   MRN: [id number]    DOB: Sep 30, 1926   Date:09/07/2020       Progress Note  Subjective  Chief Complaint  Chief Complaint  Patient presents with  . Follow-up  . Weight Check  . Gait Problem    HPI  He came in with daughter [name], he is living by himself again, has a med alert and family members checking on him twice daily and they have cameras throughout his house. He only goes to his bedroom with assistance.   Percutaneous cholecystostomy : he was admitted on 05/02/2020 and stayed until June 14 th, stayed at [facility] rehab for almost 2 weeks after that. He finally went home for the past couple of weeks. He had a percutaneous cholecystotomy placed and has been doing well.Appetite is not back to normal yet, but has improved, weight is trending up   Malnutrition: his albumin dropped during his hospital stay , he is taking boost shakes, daughter's are taking him solid meals, he has gained a few pounds and appetite has improved   Solitary lung mass: diagnosed this Summer by CT chest, he is seeing Dr. Arthor Captain and Dr. 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 He has been taking Eliquis given by Dr. Clayborn Bigness, he completed rehab for left side weakness but is still having some lower extremity weakness and uses a walker to assist with gait. Son states his short term memory but is doing much better now. His mood is back to normal, speech is back to normal and also his personality    Hyperlipidemia: reviewed labs done April 2021  he has been taking Crestor and denies side effects  GERD: he is on PPI and symptoms have been controlled  CKI : based on labs done last year,  we will recheck labs, he has good urine output  Senile purpura : on left hand, gave him reassurance  PAD; he is wearing compression stocking hoses, seen by vascular surgeon this year, on statin and eliquis, denies claudication but only walks short distances due to left side weakness and knee problems   Patient Active Problem List   Diagnosis Date Noted  . Mass of upper lobe of right lung 07/13/2020  . Calculus of bile duct with cholecystitis without obstruction   . Abnormal findings on imaging of biliary tract   . Acute cholecystitis 05/02/2020  . Chronic venous insufficiency 12/05/2019  . 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 CKD (chronic kidney disease), stage II   . New onset atrial fibrillation ([place])   . Right middle cerebral artery stroke ([place]) 05/27/2019  . Goals of care, counseling/discussion   . Palliative care by specialist   . DNR (do not resuscitate) discussion   . History of CVA (cerebrovascular accident) 05/22/2019  . Persistent proteinuria 07/30/2018  . 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 Chronic kidney disease, stage III (moderate) ([place]) 06/15/2018  . Leg pain 05/06/2018  . Lymphedema 05/06/2018  . Coagulopathy ([place]) 07/07/2017  . Lumbar spondylosis 03/10/2017  . Elevated uric acid in blood 11/20/2016  . BPH (benign prostatic hyperplasia) 05/29/2016  . Peripheral vascular disease of lower extremity ([place]) 03/03/2016  . Gallstone  12/31/2015  . 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 Atherosclerosis of aorta ([place]) 11/22/2015  . Carotid artery narrowing 11/22/2015  . Diverticulosis of colon 11/22/2015  . Decreased creatinine clearance 11/22/2015  . Arthritis, degenerative 11/22/2015  . Lactose intolerance 11/22/2015  . Basal cell carcinoma 11/22/2015  . Essential hypertension 06/21/2015  . Hyperlipemia 06/21/2015  . GERD (gastroesophageal reflux disease) 06/21/2015  . Calculus of kidney 11/18/2013     Past Surgical History:  Procedure Laterality Date  . APPENDECTOMY    . CATARACT EXTRACTION, BILATERAL    . CYSTOSCOPY WITH INSERTION OF UROLIFT    . ENDOSCOPIC RETROGRADE CHOLANGIOPANCREATOGRAPHY (ERCP) WITH PROPOFOL N/A 05/11/2020   Procedure: ENDOSCOPIC RETROGRADE CHOLANGIOPANCREATOGRAPHY (ERCP) WITH PROPOFOL;  Surgeon: [name], MD;  Location: [facility] ENDOSCOPY;  Service: Endoscopy;  Laterality: N/A;  . EYE SURGERY  08/2018  . IR EXCHANGE BILIARY DRAIN  05/04/2020  . KIDNEY STONE SURGERY    . KNEE SURGERY    . PACEMAKER INSERTION N/A 07/13/2018   Procedure: INSERTION PACEMAKER-DUEL CHAMBER INITIAL IMPLANT;  Surgeon: Isaias Cowman, MD;  Location: [facility] ORS;  Service: Cardiovascular;  Laterality: N/A;  . SPINE SURGERY      Family History  Problem Relation Age of Onset  . Cancer Brother        bladder cancer with mets  . Heart disease Mother   . Aortic aneurysm Mother   . Heart attack Maternal Aunt   . Heart attack Maternal Uncle     Social History   Tobacco Use  . Smoking status: Former Smoker    Packs/day: 0.00    Years: 16.00    Pack years: 0.00    Types: Cigarettes    Start date: 10/21/1944    Quit date: 10/21/1960    Years since quitting: 59.9  . Smokeless tobacco: Never Used  Substance Use Topics  . Alcohol use: Yes    Alcohol/week: 3.0 standard drinks    Types: 3 Standard drinks or equivalent per week    Comment: once a week when he goes out to dinner-socially     Current Outpatient Medications:  .  acetaminophen (TYLENOL) 325 MG tablet, Take 2 tablets (650 mg total) by mouth every 4 (four) hours as needed for mild pain (or temp > 37.5 C (99.5 F)). (Patient taking differently: Take 650 mg by mouth as needed for mild pain (or temp > 37.5 C (99.5 F)). ), Disp:  , Rfl:  .  apixaban (ELIQUIS) 2.5 MG TABS tablet, Take 1 tablet (2.5 mg total) by mouth 2 (two) times daily., Disp: 60 tablet, Rfl: 0 .  Cholecalciferol (VITAMIN D3) 50 MCG (2000 UT) TABS, Take  2,000 Units by mouth daily., Disp: 30 tablet, Rfl: 0 .  Ensure Max Protein (ENSURE MAX PROTEIN) LIQD, Take 330 mLs (11 oz total) by mouth 2 (two) times daily., Disp: , Rfl:  .  finasteride (PROSCAR) 5 MG tablet, Take 1 tablet (5 mg total) by mouth daily., Disp: 30 tablet, Rfl: 0 .  metoprolol succinate (TOPROL XL) 25 MG 24 hr tablet, Take 1 tablet (25 mg total) by mouth daily., Disp: 30 tablet, Rfl: 0 .  pantoprazole (PROTONIX) 40 MG tablet, TAKE ONE TABLET EVERY DAY, Disp: 90 tablet, Rfl: 0 .  rosuvastatin (CRESTOR) 40 MG tablet, Take 1 tablet (40 mg total) by mouth daily at 6 PM., Disp: 30 tablet, Rfl: 0 .  ursodiol (ACTIGALL) 300 MG capsule, Take 300 mg by mouth in the morning and at bedtime., Disp: ,  Rfl:  .  Omega-3 Fatty Acids (FISH OIL) 1000 MG CAPS, Take 1,000 mg by mouth daily.  (Patient not taking: Reported on 09/07/2020), Disp: , Rfl:  .  ondansetron (ZOFRAN) 4 MG tablet, Take 1 tablet (4 mg total) by mouth every 8 (eight) hours as needed for nausea or vomiting. (Patient not taking: Reported on 06/26/2020), Disp: 20 tablet, Rfl: 0  Allergies  Allergen Reactions  . Penicillins Itching and Swelling    Pt reported arm rash and swelling with penicillin administration while he was in the [employer].  However, pt was able to tolerate IV Unasyn without any problem, so penicillin allergy unlikely to be real or serious.  05/14/2020.    I personally reviewed active problem list, medication list, allergies, family history, social history, health maintenance with the patient/caregiver today.   ROS  Constitutional: Negative for fever , positive for mild  weight change - gained a little .  Respiratory: Negative for cough and shortness of breath.   Cardiovascular: Negative for chest pain or palpitations.  Gastrointestinal: Negative for abdominal pain, no bowel changes.  Musculoskeletal: Positive  for gait problem but no  joint swelling.  Skin: Negative for rash.  Neurological: Negative for  dizziness or headache.  No other specific complaints in a complete review of systems (except as listed in HPI above).  Objective  Vitals:   09/07/20 1341  BP: 104/60  Pulse: 79  Resp: 14  Temp: (!) 97.5 F (36.4 C)  SpO2: 100%  Weight: 158 lb 11.2 oz (72 kg)  Height: 5\' 11"  (1.803 m)    Body mass index is 22.13 kg/m.  Physical Exam   Constitutional: Patient appears well-developed but has malnutrition, temporal waisting No distress.  HEENT: head atraumatic, normocephalic, pupils equal and reactive to light,  neck supple Cardiovascular: Normal rate, regular rhythm and normal heart sounds.  No murmur heard. No BLE edema. Pulmonary/Chest: Effort normal and breath sounds normal. No respiratory distress. Abdominal: Soft.  There is no tenderness. Cholecystectomy tube in place  Muscular skeletal: using a walker to assist with ambulation Psychiatric: Patient has a normal mood and affect. behavior is normal. Judgment and thought content normal.  Recent Results (from the past 2160 hour(s))  Hepatic function panel     Status: Abnormal   Collection Time: 06/29/20  7:53 PM  Result Value Ref Range   Total Protein 6.7 6.5 - 8.1 g/dL   Albumin 3.5 3.5 - 5.0 g/dL   AST 20 15 - 41 U/L   ALT 18 0 - 44 U/L   Alkaline Phosphatase 70 38 - 126 U/L   Total Bilirubin 1.5 (H) 0.3 - 1.2 mg/dL   Bilirubin, Direct 0.3 (H) 0.0 - 0.2 mg/dL   Indirect Bilirubin 1.2 (H) 0.3 - 0.9 mg/dL    Comment: Performed at [facility], [address] [place], [place] [address]  Lipase, blood     Status: None   Collection Time: 06/29/20  7:53 PM  Result Value Ref Range   Lipase 28 11 - 51 U/L    Comment: Performed at [facility], [address], [place], [place] [address]  Basic metabolic panel     Status: Abnormal   Collection Time: 06/30/20  7:53 PM  Result Value Ref Range   Sodium 138 135 - 145 mmol/L   Potassium 3.4 (L) 3.5 - 5.1 mmol/L   Chloride 101 98 - 111 mmol/L   CO2 28 22 -  32 mmol/L   Glucose, Bld 71 70 -  99 mg/dL    Comment: Glucose reference range applies only to samples taken after fasting for at least 8 hours.   BUN 25 (H) 8 - 23 mg/dL   Creatinine, Ser 1.45 (H) 0.61 - 1.24 mg/dL   Calcium 9.1 8.9 - 10.3 mg/dL   GFR calc non Af Amer 42 (L) >60 mL/min   GFR calc Af Amer 48 (L) >60 mL/min   Anion gap 9 5 - 15    Comment: Performed at [facility], [address] [place], [place] [address]  CBC     Status: Abnormal   Collection Time: 06/30/20  7:53 PM  Result Value Ref Range   WBC 15.2 (H) 4.0 - 10.5 K/uL   RBC 4.72 4.22 - 5.81 MIL/uL   Hemoglobin 14.9 13.0 - 17.0 g/dL   HCT 43.2 39 - 52 %   MCV 91.5 80.0 - 100.0 fL   MCH 31.6 26.0 - 34.0 pg   MCHC 34.5 30.0 - 36.0 g/dL   RDW 13.4 11.5 - 15.5 %   Platelets 265 150 - 400 K/uL   nRBC 0.0 0.0 - 0.2 %    Comment: Performed at [facility], [place], [place] [address]  Troponin I (High Sensitivity)     Status: Abnormal   Collection Time: 06/30/20  7:53 PM  Result Value Ref Range   Troponin I (High Sensitivity) 20 (H) <18 ng/L    Comment: (NOTE) Elevated high sensitivity troponin I (hsTnI) values and significant  changes across serial measurements may suggest ACS but many other  chronic and acute conditions are known to elevate hsTnI results.  Refer to the "Links" section for chest pain algorithms and additional  guidance. Performed at [facility], [address] [place], [place] [address]   CBC with Differential/Platelet     Status: Abnormal   Collection Time: 06/30/20  7:53 PM  Result Value Ref Range   WBC 15.0 (H) 4.0 - 10.5 K/uL   RBC 4.76 4.22 - 5.81 MIL/uL   Hemoglobin 14.7 13.0 - 17.0 g/dL   HCT 45.2 39 - 52 %   MCV 95.0 80.0 - 100.0 fL   MCH 30.9 26.0 - 34.0 pg   MCHC 32.5 30.0 - 36.0 g/dL   RDW 13.4 11.5 - 15.5 %   Platelets 282 150 - 400 K/uL   nRBC 0.0 0.0 - 0.2 %   Neutrophils Relative % 73 %   Neutro Abs 10.8 (H) 1.7 - 7.7 K/uL    Lymphocytes Relative 14 %   Lymphs Abs 2.1 0.7 - 4.0 K/uL   Monocytes Relative 12 %   Monocytes Absolute 1.9 (H) 0.1 - 1.0 K/uL   Eosinophils Relative 1 %   Eosinophils Absolute 0.2 0 - 0 K/uL   Basophils Relative 0 %   Basophils Absolute 0.1 0 - 0 K/uL   Immature Granulocytes 0 %   Abs Immature Granulocytes 0.05 0.00 - 0.07 K/uL    Comment: Performed at [facility], [place], [place] [address]  Troponin I (High Sensitivity)     Status: Abnormal   Collection Time: 07/01/20 12:21 AM  Result Value Ref Range   Troponin I (High Sensitivity) 19 (H) <18 ng/L    Comment: (NOTE) Elevated high sensitivity troponin I (hsTnI) values and significant  changes across serial measurements may suggest ACS but many other  chronic and acute conditions are known to elevate hsTnI results.  Refer to the "Links" section for chest  pain algorithms and additional  guidance. Performed at [facility], [address] [place], [place] [address]   Culture, blood (routine x 2)     Status: None   Collection Time: 07/01/20 12:21 AM   Specimen: BLOOD  Result Value Ref Range   Specimen Description BLOOD RIGHT ANTECUBITAL    Special Requests      BOTTLES DRAWN AEROBIC AND ANAEROBIC Blood Culture results may not be optimal due to an inadequate volume of blood received in culture bottles   Culture      NO GROWTH 5 DAYS Performed at [facility], [address], [place], [place] [address]    Report Status 07/06/2020 FINAL   Lactic acid, plasma     Status: None   Collection Time: 07/01/20 12:21 AM  Result Value Ref Range   Lactic Acid, Venous 1.7 0.5 - 1.9 mmol/L    Comment: Performed at [facility], [address] [place], [place] [address]  Culture, blood (routine x 2)     Status: None   Collection Time: 07/01/20 12:22 AM   Specimen: BLOOD  Result Value Ref Range   Specimen Description BLOOD BLOOD RIGHT FOREARM    Special Requests      BOTTLES  DRAWN AEROBIC AND ANAEROBIC Blood Culture adequate volume   Culture      NO GROWTH 5 DAYS Performed at [facility], [address] [place], [place] [address]    Report Status 07/06/2020 FINAL   Glucose, capillary     Status: Abnormal   Collection Time: 07/10/20 11:13 AM  Result Value Ref Range   Glucose-Capillary 64 (L) 70 - 99 mg/dL    Comment: Glucose reference range applies only to samples taken after fasting for at least 8 hours.     PHQ2/9: Depression screen [facility] 2/9 09/07/2020 06/26/2020 05/02/2020 03/06/2020 02/07/2020  Decreased Interest 0 0 0 0 0  Down, Depressed, Hopeless 0 0 0 0 0  PHQ - 2 Score 0 0 0 0 0  Altered sleeping - 0 0 0 -  Tired, decreased energy - 0 0 0 -  Change in appetite - 0 0 0 -  Feeling bad or failure about yourself  - 0 0 0 -  Trouble concentrating - 0 0 0 -  Moving slowly or fidgety/restless - 0 0 0 -  Suicidal thoughts - 0 0 0 -  PHQ-9 Score - 0 0 0 -  Difficult doing work/chores - - - Not difficult at all -  Some recent data might be hidden    phq 9 is negative   Fall Risk: Fall Risk  09/07/2020 06/26/2020 05/02/2020 03/06/2020 02/07/2020  Falls in the past year? 0 0 0 0 0  Number falls in past yr: 0 0 - 0 0  Injury with Fall? 0 0 - 0 0  Risk for fall due to : - - Impaired balance/gait;Impaired mobility - Impaired balance/gait;Impaired mobility  Follow up - - - - Falls prevention discussed     Assessment & Plan  1. Moderate protein-calorie malnutrition ([place])  He is eating better but still underweight   2. Gait instability  Referral to PT was made, they have a back log, contacted referral coordinator again and if they are not contacted we will place a referral to another facility , she will let us know   3. Senile purpura (HCC)  Both arms, stable and given reassurance  4. Hemiparesis affecting left side as late effect of cerebrovascular accident (CVA) (  [place])  Still drags his left leg when tired  5. Atherosclerosis of aorta  ([place])  On statin therapy   6. Stage 3a chronic kidney disease (HCC)  Not on ARB/ACE, bp is low   7. Peripheral vascular disease of lower extremity ([place])   8. Non-small cell carcinoma of lung, right ([place])  Finished radiation by Dr. Donella Stade , tolerated it well   9. Need for influenza vaccination  - Flu Vaccine QUAD High Dose([place])

## 2020-09-13 ENCOUNTER — Inpatient Hospital Stay (HOSPITAL_BASED_OUTPATIENT_CLINIC_OR_DEPARTMENT_OTHER): Payer: Medicare Other | Admitting: Internal Medicine

## 2020-09-13 ENCOUNTER — Other Ambulatory Visit: Payer: Self-pay

## 2020-09-13 ENCOUNTER — Encounter: Payer: Self-pay | Admitting: Internal Medicine

## 2020-09-13 ENCOUNTER — Inpatient Hospital Stay: Payer: Medicare Other | Attending: Internal Medicine

## 2020-09-13 DIAGNOSIS — C3411 Malignant neoplasm of upper lobe, right bronchus or lung: Secondary | ICD-10-CM | POA: Diagnosis not present

## 2020-09-13 DIAGNOSIS — I129 Hypertensive chronic kidney disease with stage 1 through stage 4 chronic kidney disease, or unspecified chronic kidney disease: Secondary | ICD-10-CM | POA: Insufficient documentation

## 2020-09-13 DIAGNOSIS — N189 Chronic kidney disease, unspecified: Secondary | ICD-10-CM | POA: Insufficient documentation

## 2020-09-13 DIAGNOSIS — I4891 Unspecified atrial fibrillation: Secondary | ICD-10-CM | POA: Diagnosis not present

## 2020-09-13 DIAGNOSIS — Z7901 Long term (current) use of anticoagulants: Secondary | ICD-10-CM | POA: Insufficient documentation

## 2020-09-13 DIAGNOSIS — Z87891 Personal history of nicotine dependence: Secondary | ICD-10-CM | POA: Diagnosis not present

## 2020-09-13 DIAGNOSIS — R911 Solitary pulmonary nodule: Secondary | ICD-10-CM

## 2020-09-13 DIAGNOSIS — Z8673 Personal history of transient ischemic attack (TIA), and cerebral infarction without residual deficits: Secondary | ICD-10-CM | POA: Insufficient documentation

## 2020-09-13 DIAGNOSIS — Z79899 Other long term (current) drug therapy: Secondary | ICD-10-CM | POA: Diagnosis not present

## 2020-09-13 DIAGNOSIS — R918 Other nonspecific abnormal finding of lung field: Secondary | ICD-10-CM

## 2020-09-13 LAB — BASIC METABOLIC PANEL
Anion gap: 6 (ref 5–15)
BUN: 19 mg/dL (ref 8–23)
CO2: 29 mmol/L (ref 22–32)
Calcium: 9.1 mg/dL (ref 8.9–10.3)
Chloride: 105 mmol/L (ref 98–111)
Creatinine, Ser: 1.22 mg/dL (ref 0.61–1.24)
GFR, Estimated: 51 mL/min — ABNORMAL LOW (ref >60)
Glucose, Bld: 110 mg/dL — ABNORMAL HIGH (ref 70–99)
Potassium: 4.5 mmol/L (ref 3.5–5.1)
Sodium: 140 mmol/L (ref 135–145)

## 2020-09-13 LAB — CBC WITH DIFFERENTIAL/PLATELET
Abs Immature Granulocytes: 0.05 10*3/uL (ref 0.00–0.07)
Basophils Absolute: 0.1 10*3/uL (ref 0.0–0.1)
Basophils Relative: 1 %
Eosinophils Absolute: 0.4 10*3/uL (ref 0.0–0.5)
Eosinophils Relative: 5 %
HCT: 44.4 % (ref 39.0–52.0)
Hemoglobin: 14.7 g/dL (ref 13.0–17.0)
Immature Granulocytes: 1 %
Lymphocytes Relative: 24 %
Lymphs Abs: 2 10*3/uL (ref 0.7–4.0)
MCH: 30.8 pg (ref 26.0–34.0)
MCHC: 33.1 g/dL (ref 30.0–36.0)
MCV: 92.9 fL (ref 80.0–100.0)
Monocytes Absolute: 1 10*3/uL (ref 0.1–1.0)
Monocytes Relative: 12 %
Neutro Abs: 4.7 10*3/uL (ref 1.7–7.7)
Neutrophils Relative %: 57 %
Platelets: 261 10*3/uL (ref 150–400)
RBC: 4.78 MIL/uL (ref 4.22–5.81)
RDW: 13.9 % (ref 11.5–15.5)
WBC: 8.2 10*3/uL (ref 4.0–10.5)
nRBC: 0 % (ref 0.0–0.2)

## 2020-09-13 NOTE — Progress Notes (Signed)
[place] NOTE  Patient Care Team: Steele Sizer, MD as PCP - General (Family Medicine) Royston Cowper, MD as Consulting Physician (Urology) Yolonda Kida, MD as Consulting Physician (Cardiology) Cammie Sickle, MD as Consulting Physician (Internal Medicine) Delana Meyer, Dolores Lory, MD as Consulting Physician (Vascular Surgery) Ralene Bathe, MD as Consulting Physician (Dermatology) Garvin Fila, MD as Consulting Physician (Neurology) Noreene Filbert, MD as Radiation Oncologist (Radiation Oncology)  CHIEF COMPLAINTS/PURPOSE OF CONSULTATION: LUNG cancer   Oncology History   No history exists.   # # AUG 2021- RUL lung mass/cancer [inidental;~3cm; PET- SUV- 3.5] [more consolidated from Jan 2017]; no biopsy;  status post SBRT [finished September 2021];   #2021 acute cholecystitis-s/p cholecystostomy drain [Dr. Byrnett]  # June 2020- stroke left side of body;; Aifb [on Eliquis]  HISTORY OF PRESENTING ILLNESS:  Jose Castro 86 y.o.  male history suspected right upper lobe lung cancer s/p SBRT is here for follow-up.  Patient has chronic mild cough.  Which as per family is not gotten worse.  He denies any worsening shortness of breath.  Patient continues to complain of inconvenience from his biliary drain.  Interested in knowing when could the drain be taken out.  Review of Systems  Constitutional: Positive for weight loss. Negative for chills, diaphoresis, fever and malaise/fatigue.  HENT: Negative for nosebleeds and sore throat.   Eyes: Negative for double vision.  Respiratory: Negative for cough, hemoptysis, sputum production, shortness of breath and wheezing.   Cardiovascular: Negative for chest pain, palpitations, orthopnea and leg swelling.  Gastrointestinal: Negative for abdominal pain, blood in stool, constipation, diarrhea, heartburn, melena, nausea and vomiting.  Genitourinary: Negative for dysuria, frequency and urgency.   Musculoskeletal: Negative for back pain and joint pain.  Skin: Negative.  Negative for itching and rash.  Neurological: Negative for dizziness, tingling, focal weakness, weakness and headaches.  Endo/Heme/Allergies: Does not bruise/bleed easily.  Psychiatric/Behavioral: Negative for depression. The patient is not nervous/anxious and does not have insomnia.   All other systems reviewed and are negative.    MEDICAL HISTORY:  Past Medical History:  Diagnosis Date  . Allergy    Penicillin  . Arthritis   . Basal cell carcinoma   . BPH (benign prostatic hyperplasia)   . Chronic kidney disease    had a kidney stone which per family was a cyst that was removed  . Dysrhythmia   . GERD (gastroesophageal reflux disease)   . History of kidney stones   . Hyperlipidemia   . Hypertension   . Left-sided low back pain with left-sided sciatica   . Lung mass   . Splenic vein thrombosis   . Stroke ([place]) 05/22/2019  . Thrombosis 12/2015   mural  area and no notation of heart attack    SURGICAL HISTORY: Past Surgical History:  Procedure Laterality Date  . APPENDECTOMY    . CATARACT EXTRACTION, BILATERAL    . CYSTOSCOPY WITH INSERTION OF UROLIFT    . ENDOSCOPIC RETROGRADE CHOLANGIOPANCREATOGRAPHY (ERCP) WITH PROPOFOL N/A 05/11/2020   Procedure: ENDOSCOPIC RETROGRADE CHOLANGIOPANCREATOGRAPHY (ERCP) WITH PROPOFOL;  Surgeon: [name], MD;  Location: [facility] ENDOSCOPY;  Service: Endoscopy;  Laterality: N/A;  . EYE SURGERY  08/2018  . IR EXCHANGE BILIARY DRAIN  05/04/2020  . KIDNEY STONE SURGERY    . KNEE SURGERY    . PACEMAKER INSERTION N/A 07/13/2018   Procedure: INSERTION PACEMAKER-DUEL CHAMBER INITIAL IMPLANT;  Surgeon: Isaias Cowman, MD;  Location: [facility] ORS;  Service: Cardiovascular;  Laterality: N/A;  .  SPINE SURGERY      SOCIAL HISTORY: Social History   Socioeconomic History  . Marital status: Widowed    Spouse name: Not on file  . Number of children: 6  . Years of education: Not  on file  . Highest education level: Some college, no degree  Occupational History  . 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 Smoking status: Former Smoker    Packs/day: 0.00    Years: 16.00    Pack years: 0.00    Types: Cigarettes    Start date: 10/21/1944    Quit date: 10/21/1960    Years since quitting: 59.9  . Smokeless tobacco: Never Used  Vaping Use  . Vaping Use: Never used  Substance and Sexual Activity  . Alcohol use: Yes    Alcohol/week: 3.0 standard drinks    Types: 3 Standard drinks or equivalent per week    Comment: once a week when he goes out to dinner-socially  . Drug use: No  . Sexual activity: Yes    Partners: Female  Other Topics Concern  . Not on file  Social History Narrative   Widow, lives alone but 4 of his children rotate staying with him since July 2020 due to stroke and he is not left alone but does most things independently.       Smoke 15-20 years; quit 60 years ago. Rare alcohol. 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    Social Determinants of Health   Financial Resource Strain: Low Risk   . Difficulty of Paying Living Expenses: Not hard at all  Food Insecurity: No Food Insecurity  . Worried About Charity fundraiser in the Last Year: Never true  . Ran Out of Food in the Last Year: Never true  Transportation Needs: No Transportation Needs  . Lack of Transportation (Medical): No  . Lack of Transportation (Non-Medical): No  Physical Activity: Inactive  . Days of Exercise per Week: 0 days  . Minutes of Exercise per Session: 0 min  Stress: No Stress Concern Present  . Feeling of Stress : Not at all  Social Connections: Moderately Integrated  . Frequency of Communication with Friends and Family: More than three times a week  . Frequency of Social Gatherings with Friends and Family: Twice a week  . Attends Religious Services: More than 4 times per year  . Active Member of Clubs or Organizations: Yes  . Attends English as a second language teacher Meetings: More than 4 times per year  . Marital Status: Widowed  Intimate Partner Violence: Not At Risk  . Fear of Current or Ex-Partner: No  . Emotionally Abused: No  . Physically Abused: No  . Sexually Abused: No    FAMILY HISTORY: Family History  Problem Relation Age of Onset  . Cancer Brother        bladder cancer with mets  . Heart disease Mother   . Aortic aneurysm Mother   . Heart attack Maternal Aunt   . Heart attack Maternal Uncle     ALLERGIES:  is allergic to penicillins.  MEDICATIONS:  Current Outpatient Medications  Medication Sig Dispense Refill  . apixaban (ELIQUIS) 2.5 MG TABS tablet Take 1 tablet (2.5 mg total) by mouth 2 (two) times daily. 60 tablet 0  . Cholecalciferol (VITAMIN D3) 50 MCG (2000 UT) TABS Take 2,000 Units by mouth daily. 30 tablet 0  . Ensure Max Protein (ENSURE MAX PROTEIN) LIQD Take 330 mLs (11  oz total) by mouth 2 (two) times daily.    . finasteride (PROSCAR) 5 MG tablet Take 1 tablet (5 mg total) by mouth daily. 30 tablet 0  . pantoprazole (PROTONIX) 40 MG tablet TAKE ONE TABLET EVERY DAY 90 tablet 0  . rosuvastatin (CRESTOR) 40 MG tablet Take 1 tablet (40 mg total) by mouth daily at 6 PM. 30 tablet 0  . ursodiol (ACTIGALL) 300 MG capsule Take 300 mg by mouth in the morning and at bedtime.    Marland Kitchen acetaminophen (TYLENOL) 325 MG tablet Take 2 tablets (650 mg total) by mouth every 4 (four) hours as needed for mild pain (or temp > 37.5 C (99.5 F)). (Patient taking differently: Take 650 mg by mouth as needed for mild pain (or temp > 37.5 C (99.5 F)). )    . metoprolol succinate (TOPROL XL) 25 MG 24 hr tablet Take 1 tablet (25 mg total) by mouth daily. (Patient not taking: Reported on 09/13/2020) 30 tablet 0  . Omega-3 Fatty Acids (FISH OIL) 1000 MG CAPS Take 1,000 mg by mouth daily.  (Patient not taking: Reported on 09/07/2020)    . ondansetron (ZOFRAN) 4 MG tablet Take 1 tablet (4 mg total) by mouth every 8 (eight) hours as needed for  nausea or vomiting. (Patient not taking: Reported on 06/26/2020) 20 tablet 0   No current facility-administered medications for this visit.      Marland Kitchen  PHYSICAL EXAMINATION: ECOG PERFORMANCE STATUS: 0 - Asymptomatic  Vitals:   09/13/20 1054  BP: 134/72  Pulse: 76  Resp: 14  Temp: 97.6 F (36.4 C)  SpO2: 100%   Filed Weights   09/13/20 1054  Weight: 153 lb 12.8 oz (69.8 kg)    Physical Exam Constitutional:      Comments: Is walking with a walker.  Accompanied by his daughter.  HENT:     Head: Normocephalic and atraumatic.     Mouth/Throat:     Pharynx: No oropharyngeal exudate.  Eyes:     Pupils: Pupils are equal, round, and reactive to light.  Cardiovascular:     Rate and Rhythm: Normal rate and regular rhythm.  Pulmonary:     Effort: Pulmonary effort is normal. No respiratory distress.     Breath sounds: Normal breath sounds. No wheezing.  Abdominal:     General: Bowel sounds are normal. There is no distension.     Palpations: Abdomen is soft. There is no mass.     Tenderness: There is no abdominal tenderness. There is no guarding or rebound.     Comments: Drain in place.  Musculoskeletal:        General: No tenderness. Normal range of motion.     Cervical back: Normal range of motion and neck supple.  Skin:    General: Skin is warm.  Neurological:     Mental Status: He is alert and oriented to person, place, and time.  Psychiatric:        Mood and Affect: Affect normal.      LABORATORY DATA:  I have reviewed the data as listed Lab Results  Component Value Date   WBC 8.2 09/13/2020   HGB 14.7 09/13/2020   HCT 44.4 09/13/2020   MCV 92.9 09/13/2020   PLT 261 09/13/2020   Recent Labs    05/04/20 0418 05/04/20 0418 05/05/20 0528 05/06/20 0710 05/13/20 0624 05/13/20 0624 05/14/20 0632 06/29/20 1953 06/30/20 1953 09/13/20 1034  NA 134*   < > 137   < > 136   < >  139  --  138 140  K 4.0   < > 4.0   < > 3.5   < > 3.6  --  3.4* 4.5  CL 104   < > 109    < > 106   < > 107  --  101 105  CO2 22   < > 23   < > 25   < > 25  --  28 29  GLUCOSE 134*   < > 112*   < > 98   < > 103*  --  71 110*  BUN 37*   < > 35*   < > 18   < > 23  --  25* 19  CREATININE 2.17*   < > 2.19*   < > 1.04   < > 1.12  --  1.45* 1.22  CALCIUM 7.8*   < > 8.0*   < > 7.8*   < > 8.1*  --  9.1 9.1  GFRNONAA 26*   < > 25*   < > >60   < > 57*  --  42* 51*  GFRAA 30*   < > 29*   < > >60  --  >60  --  48*  --   PROT 5.1*   < > 5.0*   < > 4.8*  --  5.1* 6.7  --   --   ALBUMIN 2.5*   < > 2.4*   < > 2.2*  --  2.4* 3.5  --   --   AST 177*   < > 126*   < > 15  --  13* 20  --   --   ALT 321*   < > 238*   < > 20  --  18 18  --   --   ALKPHOS 164*   < > 186*   < > 80  --  84 70  --   --   BILITOT 4.7*   < > 2.3*   < > 1.2  --  1.0 1.5*  --   --   BILIDIR 2.8*  --  1.2*  --   --   --   --  0.3*  --   --   IBILI 1.9*  --  1.1*  --   --   --   --  1.2*  --   --    < > = values in this interval not displayed.    RADIOGRAPHIC STUDIES: I have personally reviewed the radiological images as listed and agreed with the findings in the report. No results found.  ASSESSMENT & PLAN:   Mass of upper lobe of right lung # RUL [~3cm]; SUV 3.5- [slightly growing from [place] 2017]-highly suspicious for malignancy based upon growth; also PET positivity. S/p SBRT [sep25th 2021]; will order CT scan in Jan 1st week; 2021.   # Biliary obstruction- s/p biliary drain; Defer to Dr.Byrnett; STABLE.  # A.fib/ stroke- on Eliquis-STABLE.   # COVID BOOSTER: Discussed given patient's diagnosis and other comorbidities/therapies-patient would be considered immunocompromised.  As per CDC recommendation/FDA approval-I would recommend booster vaccine.patient previously received moderna.  #Patient had multiple questions about his malignancy-which were answered to the best of my knowledge.  Discussed that we will await further imaging to assess response to therapy.  Also recommend reaching out to Dr. Donella Stade at the next  appointment.  We will recheck to Dr. Bary Castilla biliary drain.  # DISPOSITION:   # follow up in 3  months;  MD; labs-cbc/cmp; CT chest prior-Dr.B   All questions were answered. The patient knows to call the clinic with any problems, questions or concerns.    Cammie Sickle, MD 09/16/2020 3:50 PM

## 2020-09-13 NOTE — Assessment & Plan Note (Addendum)
#   RUL [~3cm]; SUV 3.5- [slightly growing from [place] 2017]-highly suspicious for malignancy based upon growth; also PET positivity. S/p SBRT [sep25th 2021]; will order CT scan in Jan 1st week; 2021.   # Biliary obstruction- s/p biliary drain; Defer to Dr.Byrnett; STABLE.  # A.fib/ stroke- on Eliquis-STABLE.   # COVID BOOSTER: Discussed given patient's diagnosis and other comorbidities/therapies-patient would be considered immunocompromised.  As per CDC recommendation/FDA approval-I would recommend booster vaccine.patient previously received moderna.  #Patient had multiple questions about his malignancy-which were answered to the best of my knowledge.  Discussed that we will await further imaging to assess response to therapy.  Also recommend reaching out to Dr. Donella Stade at the next appointment.  We will recheck to Dr. Bary Castilla biliary drain.  # DISPOSITION:   # follow up in 3 months;  MD; labs-cbc/cmp; CT chest prior-Dr.B

## 2020-09-20 ENCOUNTER — Ambulatory Visit: Payer: Medicare Other | Attending: Family Medicine

## 2020-09-20 ENCOUNTER — Other Ambulatory Visit: Payer: Self-pay

## 2020-09-20 DIAGNOSIS — R29898 Other symptoms and signs involving the musculoskeletal system: Secondary | ICD-10-CM | POA: Insufficient documentation

## 2020-09-20 DIAGNOSIS — M6281 Muscle weakness (generalized): Secondary | ICD-10-CM

## 2020-09-20 DIAGNOSIS — R2681 Unsteadiness on feet: Secondary | ICD-10-CM | POA: Insufficient documentation

## 2020-09-20 DIAGNOSIS — R2689 Other abnormalities of gait and mobility: Secondary | ICD-10-CM | POA: Insufficient documentation

## 2020-09-20 NOTE — Therapy (Signed)
[place] MAIN [facility] SERVICES [address] [place], [place], [address] Phone: [phone]   Fax:  [phone]  Physical Therapy Evaluation  Patient Details  Name: Jose Castro MRN: [id number] Date of Birth: 1926/05/18 Referring Provider (PT): Dr. Ancil Boozer   Encounter Date: 09/20/2020   PT End of Session - 09/20/20 1358    Visit Number 1    Number of Visits 25    Date for PT Re-Evaluation 12/13/20    Authorization Type eval: 09/20/20    PT Start Time 1030    PT Stop Time 1130    PT Time Calculation (min) 60 min    Equipment Utilized During Treatment Gait belt    Activity Tolerance Patient tolerated treatment well    Behavior During Therapy WFL for tasks assessed/performed           Past Medical History:  Diagnosis Date   Allergy    Penicillin   Arthritis    Basal cell carcinoma    BPH (benign prostatic hyperplasia)    Chronic kidney disease    had a kidney stone which per family was a cyst that was removed   Dysrhythmia    GERD (gastroesophageal reflux disease)    History of kidney stones    Hyperlipidemia    Hypertension    Left-sided low back pain with left-sided sciatica    Lung mass    Splenic vein thrombosis    Stroke ([place]) 05/22/2019   Thrombosis 12/2015   mural  area and no notation of heart attack    Past Surgical History:  Procedure Laterality Date   APPENDECTOMY     CATARACT EXTRACTION, BILATERAL     CYSTOSCOPY WITH INSERTION OF UROLIFT     ENDOSCOPIC RETROGRADE CHOLANGIOPANCREATOGRAPHY (ERCP) WITH PROPOFOL N/A 05/11/2020   Procedure: ENDOSCOPIC RETROGRADE CHOLANGIOPANCREATOGRAPHY (ERCP) WITH PROPOFOL;  Surgeon: [name], MD;  Location: [facility] ENDOSCOPY;  Service: Endoscopy;  Laterality: N/A;   EYE SURGERY  08/2018   IR EXCHANGE BILIARY DRAIN  05/04/2020   KIDNEY STONE SURGERY     KNEE SURGERY     PACEMAKER INSERTION N/A 07/13/2018   Procedure: INSERTION PACEMAKER-DUEL CHAMBER INITIAL  IMPLANT;  Surgeon: Isaias Cowman, MD;  Location: [facility] ORS;  Service: Cardiovascular;  Laterality: N/A;   SPINE SURGERY      There were no vitals filed for this visit.    Subjective Assessment - 09/20/20 1030    Subjective Unsteadiness and weakness    Pertinent History Pt referred for deconditioning and gait instability. He has had a decline since he last finished therapy. Pt had a hospital admission from 05/02/20 to 05/14/20 for acute cholecystitis and is now s/p cholecystostomy drain placement. Drain is still in place and pt is waiting to hear when they plan to remove it. Pt discharged to SNF and then returned home. In addition pt underwent radiation therapy for a RUL lung mass/cancer and had his last radiation treatment in September of 2021. He reports that he has continued to walk at home as his primary exercise. Pt was previously seen at this clinic for PT/OT due to weakness/imbalance s/p CVA. Prior history from 09/21/19: Pt is a 86 year old male who presents with imbalance and difficulty with gait following a right frontal CVA with left hemiparesis on 05/22/19.  He has completed inpatient and home health PT, and now presents for OP PT.  He was discharged from inpatient rehab at a supervision level of assistance, ambulating with a RW.  His family notes that  he is impulsive and will try to ambulate without his RW at home.  Family is currently providing 24/7 supervision.    Limitations House hold activities;Walking    How long can you sit comfortably? no limitations    How long can you walk comfortably? he requires a RW    Patient Stated Goals Pt wants to be able to walk his granddaughter down the aisle at her wedding November 2022    Currently in Pain? No/denies   No resting pain but he is dealing with chronic L knee pain              [facility] PT Assessment - 09/20/20 1104      Assessment   Medical Diagnosis Gait instability, deconditioning    Referring Provider (PT) Dr. Ancil Boozer     Onset Date/Surgical Date 05/21/19   Initial CVA, weakness since admission 05/02/20-05/14/20   Hand Dominance Right    Next MD Visit Not reported    Prior Therapy Inpatient, HH, and OP PT      Precautions   Precautions Fall      Restrictions   Weight Bearing Restrictions No      Balance Screen   Has the patient fallen in the past 6 months Yes    How many times? 1    Has the patient had a decrease in activity level because of a fear of falling?  Yes    Is the patient reluctant to leave their home because of a fear of falling?  Yes      [place] Private residence    Living Arrangements Alone   Daily care from children (4) and monitored with cameras   Available Help at Discharge Family;Available 24 hours/day    Type of Home House    Home Access Stairs to enter    Entrance Stairs-Number of Steps 2    Entrance Stairs-Rails Right;Left;Can reach both    Home Layout Two level;1/2 bath on main level;Bed/bath upstairs    Alternate Level Stairs-Number of Steps 13    Alternate Level Stairs-Rails Right;Left;Can reach both    Home Equipment Walker - 2 wheels;Bedside commode;Shower seat;Grab bars - tub/shower;Tub bench;Wheelchair - [employer] - 4 wheels      Prior Function   Level of Independence Independent    Vocation Retired   worked at [employer], [employer]ia      Cognition   Overall Cognitive Status Within [employer] for tasks assessed    Attention Divided    Memory Appears intact    Awareness Impaired    Problem Solving Impaired    Behaviors Impulsive      Sensation   Light Touch --    Stereognosis --    Hot/Cold --    Proprioception --      Strength   Overall Strength Comments --      Standardized Balance Assessment   Standardized Balance Assessment Berg Balance Test    Five times sit to stand comments  --    10 Meter Walk --      Berg Balance Test   Sit to Stand Able to stand  independently using hands    Standing Unsupported  Able to stand 2 minutes with supervision    Sitting with Back Unsupported but Feet Supported on Floor or Stool Able to sit safely and securely 2 minutes    Stand to Sit Controls descent by using hands    Transfers Able to transfer safely, definite  need of hands    Standing Unsupported with Eyes Closed Able to stand 10 seconds with supervision    Standing Unsupported with Feet Together Needs help to attain position but able to stand for 30 seconds with feet together    From Standing, Reach Forward with Outstretched Arm Can reach forward >5 cm safely (2")    From Standing Position, Pick up Object from Floor Able to pick up shoe, needs supervision    From Standing Position, Turn to Look Behind Over each Shoulder Turn sideways only but maintains balance    Turn 360 Degrees Needs close supervision or verbal cueing    Standing Unsupported, Alternately Place Feet on Step/Stool Able to complete >2 steps/needs minimal assist    Standing Unsupported, One Foot in Front Able to take small step independently and hold 30 seconds    Standing on One Leg Tries to lift leg/unable to hold 3 seconds but remains standing independently    Total Score 32      Timed Up and Go Test   TUG --    Normal TUG (seconds) --               SUBJECTIVE Chief complaint: Onset: Pt referred for deconditioning and gait instability. He has had a decline since he last finished therapy. Pt had a hospital admission from 05/02/20 to 05/14/20 for acute cholecystitis and is now s/p cholecystostomy drain placement. Drain is still in place and pt is waiting to hear when they plan to remove it. Pt discharged to SNF and then returned home. In addition pt underwent radiation therapy for a RUL lung mass/cancer and had his last radiation treatment in September of 2021. He reports that he has continued to walk at home as his primary exercise. Pt was previously seen at this clinic for PT/OT due to weakness/imbalance s/p CVA. Prior history from  09/21/19: Pt is a 86 year old male who presents with imbalance and difficulty with gait following a right frontal CVA with left hemiparesis on 05/22/19.  He has completed inpatient and home health PT, and now presents for OP PT.  He was discharged from inpatient rehab at a supervision level of assistance, ambulating with a RW.  His family notes that he is impulsive and will try to ambulate without his RW at home.  Family is currently providing 24/7 supervision. Prior history of physical therapy for balance: Yes Follow-up appointment with MD: Not reported   OBJECTIVE  MUSCULOSKELETAL: Tremor: Absent Bulk: Normal Tone: Normal, no clonus  Posture Thoracic kyphosis with forward head/neck and rounded shoulders  Gait Decreased speed and step length with rollator with decreased toe to floor clearance and forward trunk.   Strength R/L 4+/4+ Hip flexion 5/5 Hip external rotation 5/5 Hip internal rotation 5/5 Hip abduction (sitting) 5/5 Hip adduction (sitting) 5/5 Knee extension 4+/4+ Knee flexion Active Ankle Plantarflexion bilaterally 5/5 Ankle Dorsiflexion  NEUROLOGICAL:  Mental Status Patient is oriented to person, place and time.  Recent memory is intact.  Remote memory is intact.  Attention span and concentration are somewhat divided and hearing is diminished  Expressive speech is intact.  Patient's fund of knowledge is within normal limits for educational level.  Cranial Nerves Not formally assessed today, hearing is slightly diminished today however this is baseline for patient and he is not wearing his hearing aids.   Sensation Grossly intact to light touch bilateral UEs/LEs as determined by testing dermatomes C2-T2/L2-S2 respectively Proprioception and hot/cold testing deferred on this date  Coordination/Cerebellar Finger to  Nose: mild dysmetria LUE Rapid alternating movements: WNL Finger Opposition: WNL Pronator Drift: Mild positive  LUE  Reflexes Deferred    FUNCTIONAL OUTCOME MEASURES   Results Comments  BERG 32/56 Fall risk, in need of intervention  TUG 19.2 seconds Fall risk, in need of intervention  5TSTS 23.1 seconds Pt requiring minA+1 to complete test  6 Minute Walk Test Deferred Deferred  10 Meter Gait Speed Self-selected: 14.4s = 0.69 m/s; Fastest: 10.7s = 0.93 m/s Below normative values for full community ambulation  ABC Scale 95% Above cut-off, not congruent with his level of objective balance deficit  FOTO 83 Predicted decline to 79     POSTURAL CONTROL TESTS   Modified Clinical Test of Sensory Interaction for Balance    (CTSIB): Deferred           Objective measurements completed on examination: See above findings.               PT Education - 09/20/20 1358    Education Details Plan of care    Person(s) Educated Patient    Methods Explanation    Comprehension Verbalized understanding            PT Short Term Goals - 09/20/20 1401      PT SHORT TERM GOAL #1   Title Pt will be independent with HEP in order to improve strength and balance in order to decrease fall risk and improve function at home.    Time 6    Period Weeks    Status New    Target Date 11/01/20             PT Long Term Goals - 09/20/20 1401      PT LONG TERM GOAL #1   Title Pt will improve BERG by at least 3 points in order to demonstrate clinically significant improvement in balance.    Baseline 09/20/20: 32/56 (Previous discharge: 03/06/20: 46/56)    Time 12    Period Weeks    Status New    Target Date 12/13/20      PT LONG TERM GOAL #2   Title Pt will decrease TUG to below 14 seconds/decrease in order to demonstrate decreased fall risk.    Baseline 09/20/20: 19.2s    Time 12    Period Weeks    Status New    Target Date 12/13/20      PT LONG TERM GOAL #3   Title Pt will decrease 5TSTS by at least 3 seconds in order to demonstrate clinically significant improvement in LE strength.     Baseline 09/20/20: 23.1s with minA+1 for strength and balance (Previous discharge 03/06/20: 16.1s)    Time 12    Period Weeks    Status New    Target Date 12/13/20      PT LONG TERM GOAL #4   Title Pt will increase his self-selected 10MWT to >0.74 m/s in order to demonstrate improvement in gait speed and improved community ambulation    Baseline 09/20/20: self-selected: 14.4s = 0.69 m/s with rollator (Previous discharge 03/06/20: self-selected: 13.6s = 0.74 m/s)    Time 12    Period Weeks    Status New    Target Date 12/13/20                  Plan - 09/20/20 1359    Clinical Impression Statement Pt is a pleasant 86 year-old male referred for deconditioning and gait instability. Pt was previously treated at this clinic s/p CVA.  Since his discharge in April of 2021 pt has experienced a significant decline in his balance and strength.  He has notable imbalance scoring 32/56 on the BERG.  His TUG and 5 times sit to stand both indicate increased fall risk and decreased lower extremity strength as well as decline from prior episode of physical therapy.  Patient's 10 m gait speed is below functional speed necessary for full community ambulation.  His ABC score of 95% is incongruent with his objective outcome measures indicating that patient is at a high risk for attempting more risky activities which might cause a fall. Pt presents with deficits in strength, gait and balance.  He will benefit from skilled PT services to address deficits in balance and decrease risk for future falls.    Personal Factors and Comorbidities Age;Comorbidity 3+    Comorbidities HTN, CVA, cholecystitis with cholecystostomy drain    Examination-Activity Limitations Bend;Lift;Squat;Locomotion Level;Stairs;Stand    Examination-Participation Restrictions Community Activity;Driving;Laundry;Meal Prep;Shop    Stability/Clinical Decision Making Unstable/Unpredictable    Clinical Decision Making High    Rehab Potential Fair     PT Frequency 2x / week    PT Duration 12 weeks    PT Treatment/Interventions ADLs/Self Care Home Management;Canalith Repostioning;Cryotherapy;Electrical Stimulation;Iontophoresis 4mg /ml Dexamethasone;Moist Heat;Traction;Ultrasound;DME Instruction;Gait training;Stair training;Therapeutic activities;Therapeutic exercise;Functional mobility training;Balance training;Neuromuscular re-education;Patient/family education;Manual techniques;Dry needling;Vestibular;Joint Manipulations;Spinal Manipulations;Aquatic Therapy    PT Next Visit Plan Consider 2MWT or 6MWT, initiate HEP, initiate balance and strengthening    PT Home Exercise Plan 09/20/20: None currently. Previous episode access code: [email or web address]    Consulted and Agree with Plan of Care Patient;Family member/caregiver    Family Member Consulted Daughter           Patient will benefit from skilled therapeutic intervention in order to improve the following deficits and impairments:  Abnormal gait, Decreased balance, Decreased mobility, Difficulty walking, Decreased knowledge of precautions, Decreased safety awareness, Decreased activity tolerance, Decreased strength, Decreased endurance  Visit Diagnosis: Muscle weakness (generalized) - Plan: PT plan of care cert/re-cert  Unsteadiness on feet - Plan: PT plan of care cert/re-cert     Problem List Patient Active Problem List   Diagnosis Date Noted   Non-small cell carcinoma of lung, right ([place]) 09/07/2020   Mass of upper lobe of right lung 07/13/2020   Calculus of bile duct with cholecystitis without obstruction    Abnormal findings on imaging of biliary tract    Acute cholecystitis 05/02/2020   Chronic venous insufficiency 12/05/2019   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    CKD (chronic kidney disease), stage II    New onset atrial fibrillation ([facility])    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   Goals of care, counseling/discussion    Palliative care by  specialist    DNR (do not resuscitate) discussion    History of CVA (cerebrovascular accident) 05/22/2019   Persistent proteinuria 07/30/2018   Mobitz type 2 second degree heart block 07/13/2018   Chronic kidney disease, stage III (moderate) ([place]) 06/15/2018   Leg pain 05/06/2018   Lymphedema 05/06/2018   Coagulopathy ([place]) 07/07/2017   Lumbar spondylosis 03/10/2017   Elevated uric acid in blood 11/20/2016   BPH (benign prostatic hyperplasia) 05/29/2016   Peripheral vascular disease of lower extremity ([place]) 03/03/2016   Gallstone 12/31/2015   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   Atherosclerosis of aorta ([place]) 11/22/2015   Carotid artery narrowing 11/22/2015   Diverticulosis of colon 11/22/2015   Decreased creatinine clearance 11/22/2015  Arthritis, degenerative 11/22/2015   Lactose intolerance 11/22/2015   Basal cell carcinoma 11/22/2015   Essential hypertension 06/21/2015   Hyperlipemia 06/21/2015   GERD (gastroesophageal reflux disease) 06/21/2015   Calculus of kidney 11/18/2013   Phillips Grout PT, DPT, GCS  Delano Scardino 09/20/2020, 2:17 PM  [place] MAIN [facility] SERVICES [address] [place], [place], [address] Phone: [phone]   Fax:  [phone]  Name: SHANDON BURLINGAME MRN: [id number] Date of Birth: 18-Apr-1926

## 2020-09-24 ENCOUNTER — Encounter: Payer: Self-pay | Admitting: Radiation Oncology

## 2020-09-24 ENCOUNTER — Other Ambulatory Visit: Payer: Self-pay

## 2020-09-24 ENCOUNTER — Ambulatory Visit
Admission: RE | Admit: 2020-09-24 | Discharge: 2020-09-24 | Disposition: A | Discharge status: Home / Self Care | Payer: Medicare Other | Source: Ambulatory Visit | Attending: Radiation Oncology | Admitting: Radiation Oncology

## 2020-09-24 VITALS — BP 116/75 | HR 83 | Temp 98.3°F | Resp 16 | Wt 156.3 lb

## 2020-09-24 DIAGNOSIS — R918 Other nonspecific abnormal finding of lung field: Secondary | ICD-10-CM

## 2020-09-24 DIAGNOSIS — C3411 Malignant neoplasm of upper lobe, right bronchus or lung: Secondary | ICD-10-CM | POA: Diagnosis present

## 2020-09-24 DIAGNOSIS — Z923 Personal history of irradiation: Secondary | ICD-10-CM | POA: Insufficient documentation

## 2020-09-24 NOTE — Progress Notes (Signed)
Radiation Oncology Follow up Note  Name: Jose Castro   Date:   09/24/2020 MRN:  [id number] DOB: Aug 05, 1926    This 86 y.o. male presents to the clinic today for 1 month follow-up status post SBRT to the posterior portion of the right upper lobe for stage I non-small cell lung cancer.  REFERRING PROVIDER: Steele Sizer, MD  HPI: Patient is a 86 year old male now out 1 month having completed SBRT to his right upper lobe for stage I non-small cell lung cancer seen today in routine follow-up he is fairly asymptomatic specifically denies cough hemoptysis chest tightness or any dysphagia.  He has been seen by medical oncology and scheduled for a repeat CT scan in January..  COMPLICATIONS OF TREATMENT: none  FOLLOW UP COMPLIANCE: keeps appointments   PHYSICAL EXAM:  BP 116/75 (BP Location: Right Arm, Patient Position: Sitting, Cuff Size: Normal)    Pulse 83    Temp 98.3 F (36.8 C) (Tympanic)    Resp 16    Wt 156 lb 4.8 oz (70.9 kg)    BMI 21.80 kg/m  Well-developed well-nourished patient in NAD. HEENT reveals PERLA, EOMI, discs not visualized.  Oral cavity is clear. No oral mucosal lesions are identified. Neck is clear without evidence of cervical or supraclavicular adenopathy. Lungs are clear to A&P. Cardiac examination is essentially unremarkable with regular rate and rhythm without murmur rub or thrill. Abdomen is benign with no organomegaly or masses noted. Motor sensory and DTR levels are equal and symmetric in the upper and lower extremities. Cranial nerves II through XII are grossly intact. Proprioception is intact. No peripheral adenopathy or edema is identified. No motor or sensory levels are noted. Crude visual fields are within normal range.  RADIOLOGY RESULTS: No current films to review  PLAN: Present time he is doing well very low side effect profile from his treatments.  I will see him back in approximately 3 months after his next CT scan for evaluation.  He is otherwise tile  tolerated SBRT extremely well.  His daughter was here for my exam.  Some question about how to know we were successful and I have explained to him serial CT scans will show a permanent scar which we do not expect to grow over time.  Follow-up point was given.  I would like to take this opportunity to thank you for allowing me to participate in the care of your patient.Noreene Filbert, MD

## 2020-09-25 ENCOUNTER — Encounter: Payer: Self-pay | Admitting: Physical Therapy

## 2020-09-25 ENCOUNTER — Ambulatory Visit: Payer: Medicare Other | Admitting: Physical Therapy

## 2020-09-25 DIAGNOSIS — R2689 Other abnormalities of gait and mobility: Secondary | ICD-10-CM

## 2020-09-25 DIAGNOSIS — R29898 Other symptoms and signs involving the musculoskeletal system: Secondary | ICD-10-CM

## 2020-09-25 DIAGNOSIS — M6281 Muscle weakness (generalized): Secondary | ICD-10-CM

## 2020-09-25 DIAGNOSIS — R2681 Unsteadiness on feet: Secondary | ICD-10-CM

## 2020-09-25 NOTE — Patient Instructions (Signed)
Access Code: [email or web address] URL: [email or web address] Date: 09/25/2020 Prepared by: Blanche East  Exercises Seated Hip Flexion March with Ankle Weights - 1 x daily - 7 x weekly - 2 sets - 10 reps Seated Long Arc Quad - 1 x daily - 7 x weekly - 2 sets - 10 reps Seated Toe Raise - 1 x daily - 7 x weekly - 2 sets - 10 reps Seated Heel Raise - 1 x daily - 7 x weekly - 2 sets - 10 reps Seated Scapular Retraction - 1 x daily - 7 x weekly - 2 sets - 10 reps

## 2020-09-25 NOTE — Therapy (Signed)
[place] MAIN [facility] SERVICES [address] [place], [place], [address] Phone: [phone]   Fax:  [phone]  Physical Therapy Treatment  Patient Details  Name: Jose Castro MRN: [id number] Date of Birth: 07-14-1926 Referring Provider (PT): Dr. Ancil Boozer   Encounter Date: 09/25/2020   PT End of Session - 09/25/20 1152    Visit Number 2    Number of Visits 25    Date for PT Re-Evaluation 12/13/20    Authorization Type eval: 09/20/20    PT Start Time 2878    PT Stop Time 1229    PT Time Calculation (min) 44 min    Equipment Utilized During Treatment Gait belt    Activity Tolerance Patient tolerated treatment well    Behavior During Therapy [facility] for tasks assessed/performed           Past Medical History:  Diagnosis Date  . Allergy    Penicillin  . Arthritis   . Basal cell carcinoma   . BPH (benign prostatic hyperplasia)   . Chronic kidney disease    had a kidney stone which per family was a cyst that was removed  . Dysrhythmia   . GERD (gastroesophageal reflux disease)   . History of kidney stones   . Hyperlipidemia   . Hypertension   . Left-sided low back pain with left-sided sciatica   . Lung mass   . Splenic vein thrombosis   . Stroke ([place]) 05/22/2019  . Thrombosis 12/2015   mural  area and no notation of heart attack    Past Surgical History:  Procedure Laterality Date  . APPENDECTOMY    . CATARACT EXTRACTION, BILATERAL    . CYSTOSCOPY WITH INSERTION OF UROLIFT    . ENDOSCOPIC RETROGRADE CHOLANGIOPANCREATOGRAPHY (ERCP) WITH PROPOFOL N/A 05/11/2020   Procedure: ENDOSCOPIC RETROGRADE CHOLANGIOPANCREATOGRAPHY (ERCP) WITH PROPOFOL;  Surgeon: [name], MD;  Location: [facility] ENDOSCOPY;  Service: Endoscopy;  Laterality: N/A;  . EYE SURGERY  08/2018  . IR EXCHANGE BILIARY DRAIN  05/04/2020  . KIDNEY STONE SURGERY    . KNEE SURGERY    . PACEMAKER INSERTION N/A 07/13/2018   Procedure: INSERTION PACEMAKER-DUEL CHAMBER INITIAL  IMPLANT;  Surgeon: Isaias Cowman, MD;  Location: [facility] ORS;  Service: Cardiovascular;  Laterality: N/A;  . SPINE SURGERY      There were no vitals filed for this visit.   Subjective Assessment - 09/25/20 1150    Subjective Patient notes having fallen off toilet seat since last session. Patient was bending forward to reach something on the floor while sitting, notes being able to get up using the sink as a stable surface. He denies any injuries.    Pertinent History Pt referred for deconditioning and gait instability. He has had a decline since he last finished therapy. Pt had a hospital admission from 05/02/20 to 05/14/20 for acute cholecystitis and is now s/p cholecystostomy drain placement. Drain is still in place and pt is waiting to hear when they plan to remove it. Pt discharged to SNF and then returned home. In addition pt underwent radiation therapy for a RUL lung mass/cancer and had his last radiation treatment in September of 2021. He reports that he has continued to walk at home as his primary exercise. Pt was previously seen at this clinic for PT/OT due to weakness/imbalance s/p CVA. Prior history from 09/21/19: Pt is a 86 year old male who presents with imbalance and difficulty with gait following a right frontal CVA with left hemiparesis on 05/22/19.  He  has completed inpatient and home health PT, and now presents for OP PT.  He was discharged from inpatient rehab at a supervision level of assistance, ambulating with a RW.  His family notes that he is impulsive and will try to ambulate without his RW at home.  Family is currently providing 24/7 supervision.    Limitations House hold activities;Walking    How long can you sit comfortably? no limitations    How long can you walk comfortably? he requires a RW    Patient Stated Goals Pt wants to be able to walk his granddaughter down the aisle at her wedding November 2022    Currently in Pain? No/denies          Treatment: NuStep level  3, 5 minutes for BUE and BLE coordination and general strengthening. RPM >50 for cardiovascular training.   Standing in // Bars: Side stepping with SUE support. CGA for safety. Performed 6x length of bars.  Step up/over hurdle. PT cues for weight shift onto front foot. Performed 10x each leg (20x total)  On Airex Pad: normal BOS 30 sec x 2 bouts. CGA for safety. Patient demonstrates ankle righting reactions throughout task.  On Airex Pad: NBOS 30 sec x 1. CGA for safety. Patient demonstrates L lateral displacement requiring min A from PT to stabilize.  With 2# Ankle Weights: Standing marches. PT cues for slow execution for maximum muscle activation. 10x each leg  Hip abduction. PT cues for slow execution for maximum muscle activation. 10x each leg.  Seated: Resisted hip abduction with red theraband. 10x 3 second holds.                            PT Education - 09/25/20 1137    Education Details exercise technique, body mechanics, HEP    Person(s) Educated Patient    Methods Explanation;Demonstration;Tactile cues;Verbal cues    Comprehension Verbalized understanding;Returned demonstration;Verbal cues required;Tactile cues required            PT Short Term Goals - 09/20/20 1401      PT SHORT TERM GOAL #1   Title Pt will be independent with HEP in order to improve strength and balance in order to decrease fall risk and improve function at home.    Time 6    Period Weeks    Status New    Target Date 11/01/20             PT Long Term Goals - 09/20/20 1401      PT LONG TERM GOAL #1   Title Pt will improve BERG by at least 3 points in order to demonstrate clinically significant improvement in balance.    Baseline 09/20/20: 32/56 (Previous discharge: 03/06/20: 46/56)    Time 12    Period Weeks    Status New    Target Date 12/13/20      PT LONG TERM GOAL #2   Title Pt will decrease TUG to below 14 seconds/decrease in order to demonstrate decreased  fall risk.    Baseline 09/20/20: 19.2s    Time 12    Period Weeks    Status New    Target Date 12/13/20      PT LONG TERM GOAL #3   Title Pt will decrease 5TSTS by at least 3 seconds in order to demonstrate clinically significant improvement in LE strength.    Baseline 09/20/20: 23.1s with minA+1 for strength and balance (Previous discharge 03/06/20: 16.1s)  Time 12    Period Weeks    Status New    Target Date 12/13/20      PT LONG TERM GOAL #4   Title Pt will increase his self-selected 10MWT to >0.74 m/s in order to demonstrate improvement in gait speed and improved community ambulation    Baseline 09/20/20: self-selected: 14.4s = 0.69 m/s with rollator (Previous discharge 03/06/20: self-selected: 13.6s = 0.74 m/s)    Time 12    Period Weeks    Status New    Target Date 12/13/20                 Plan - 09/25/20 1243    Clinical Impression Statement Interventions this date focused on BLE strengthening and static balance on compliant surface. Patient demonstrates good activity tolerance this session, indicated by reduced requests for therapeutic rest breaks. Patient able to perform strengthening with minimal provocation of pain in LE knee. Patient states that L knee is in slight discomfort in unilateral stance. Patient requires cues for pacing during exercise, with education provided on momentum not providing sufficient muscular activation. HEP administered this date focusing on seated BLE strengthening and scapular musculature activation to reduce thoracic kyphosis to promote upright posture. He will benefit from skilled PT services to address deficits in balance and decrease risk for future falls.    Personal Factors and Comorbidities Age;Comorbidity 3+    Comorbidities HTN, CVA, cholecystitis with cholecystostomy drain    Examination-Activity Limitations Bend;Lift;Squat;Locomotion Level;Stairs;Stand    Examination-Participation Restrictions Community Activity;Driving;Laundry;Meal  Prep;Shop    Stability/Clinical Decision Making Unstable/Unpredictable    Rehab Potential Fair    PT Frequency 2x / week    PT Duration 12 weeks    PT Treatment/Interventions ADLs/Self Care Home Management;Canalith Repostioning;Cryotherapy;Electrical Stimulation;Iontophoresis 4mg /ml Dexamethasone;Moist Heat;Traction;Ultrasound;DME Instruction;Gait training;Stair training;Therapeutic activities;Therapeutic exercise;Functional mobility training;Balance training;Neuromuscular re-education;Patient/family education;Manual techniques;Dry needling;Vestibular;Joint Manipulations;Spinal Manipulations;Aquatic Therapy    PT Next Visit Plan Consider 2MWT or 6MWT, initiate HEP, initiate balance and strengthening    PT Home Exercise Plan 09/20/20: None currently. Previous episode access code: [email or web address]    Consulted and Agree with Plan of Care Patient;Family member/caregiver    Family Member Consulted Daughter           Patient will benefit from skilled therapeutic intervention in order to improve the following deficits and impairments:  Abnormal gait, Decreased balance, Decreased mobility, Difficulty walking, Decreased knowledge of precautions, Decreased safety awareness, Decreased activity tolerance, Decreased strength, Decreased endurance  Visit Diagnosis: Muscle weakness (generalized)  Unsteadiness on feet  Other abnormalities of gait and mobility  Decreased strength of lower extremity     Problem List Patient Active Problem List   Diagnosis Date Noted  . Non-small cell carcinoma of lung, right ([place]) 09/07/2020  . Mass of upper lobe of right lung 07/13/2020  . Calculus of bile duct with cholecystitis without obstruction   . Abnormal findings on imaging of biliary tract   . Acute cholecystitis 05/02/2020  . Chronic venous insufficiency 12/05/2019  . 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 CKD (chronic kidney disease), stage II   . New onset atrial fibrillation ([place])   . Right  middle cerebral artery stroke ([place]) 05/27/2019  . Goals of care, counseling/discussion   . Palliative care by specialist   . DNR (do not resuscitate) discussion   . History of CVA (cerebrovascular accident) 05/22/2019  . Persistent proteinuria 07/30/2018  . 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 Chronic kidney disease, stage III (  moderate) ([place]) 06/15/2018  . Leg pain 05/06/2018  . Lymphedema 05/06/2018  . Coagulopathy ([place]) 07/07/2017  . Lumbar spondylosis 03/10/2017  . Elevated uric acid in blood 11/20/2016  . BPH (benign prostatic hyperplasia) 05/29/2016  . Peripheral vascular disease of lower extremity ([place]) 03/03/2016  . Gallstone 12/31/2015  . 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 Atherosclerosis of aorta ([place]) 11/22/2015  . Carotid artery narrowing 11/22/2015  . Diverticulosis of colon 11/22/2015  . Decreased creatinine clearance 11/22/2015  . Arthritis, degenerative 11/22/2015  . Lactose intolerance 11/22/2015  . Basal cell carcinoma 11/22/2015  . Essential hypertension 06/21/2015  . Hyperlipemia 06/21/2015  . GERD (gastroesophageal reflux disease) 06/21/2015  . Calculus of kidney 11/18/2013   Tonny Bollman, SPT This entire session was performed under direct supervision and direction of a licensed therapist/therapist assistant . I have personally read, edited and approve of the note as written. Hillis Range, PT, DPT 09/26/20 8:24 AM   09/26/2020, 8:23 AM  [place] MAIN [facility] SERVICES [address] [place], [place], [address] Phone: [phone]   Fax:  [phone]  Name: Jose Castro MRN: [id number] Date of Birth: 1926-04-07

## 2020-09-28 ENCOUNTER — Ambulatory Visit: Payer: Medicare Other

## 2020-09-28 ENCOUNTER — Ambulatory Visit: Payer: Self-pay

## 2020-09-28 NOTE — Telephone Encounter (Signed)
°   DR [place] Male, 86 y.o., September 14, 1926 MRN:  [id number] Phone:  [phone] Jerilynn Mages) PCP:  Steele Sizer, MD Primary Cvg:  [place] [employer] Next Appt With Rehabilitation 10/01/2020 at 10:15 AM Message from Jodie Echevaria sent at 09/28/2020 9:30 AM EDT  Patient daughter [name] called to say that patient woke up not feeling well complaining of pain in both feet from heel to toes also complained of vomiting last night and has a cough. Please call [name] at Ph# [phone]   Call History   Type Contact Phone User  09/28/2020 09:51 AM EDT Phone Lahoma Crocker) Ames Dura R (Emergency Contact) [phone] Marcello Moores, RN  Left Message   09/28/2020 09:26 AM EDT Phone (Incoming) Ulyses Jarred (Emergency Contact) [phone] Jodie Echevaria  Encounter Report  Patient Encounter Report   Daughter reports pt. Started having bilateral foot pain this week. Hurts from heel to toes on bottom of feet. States he is going to the foot doctor Monday. Will follow up as needed. Answer Assessment - Initial Assessment Questions 1. ONSET: "When did the pain start?"      This week 2. LOCATION: "Where is the pain located?"      Bottoms of both feet 3. PAIN: "How bad is the pain?"    (Scale 1-10; or mild, moderate, severe)  - MILD (1-3): doesn't interfere with normal activities.   - MODERATE (4-7): interferes with normal activities (e.g., work or school) or awakens from sleep, limping.   - SEVERE (8-10): excruciating pain, unable to do any normal activities, unable to walk.      Heel to toes - Mild 4. WORK OR EXERCISE: "Has there been any recent work or exercise that involved this part of the body?"      Unsure 5. CAUSE: "What do you think is causing the foot pain?"     Unsure 6. OTHER SYMPTOMS: "Do you have any other symptoms?" (e.g., leg pain, rash, fever, numbness)     No 7. PREGNANCY: "Is there any chance you are pregnant?" "When was your last menstrual period?"      n/a  Protocols used: FOOT PAIN-A-AH

## 2020-10-01 ENCOUNTER — Ambulatory Visit (INDEPENDENT_AMBULATORY_CARE_PROVIDER_SITE_OTHER): Payer: Medicare Other | Admitting: Podiatry

## 2020-10-01 ENCOUNTER — Ambulatory Visit: Payer: Medicare Other | Attending: Family Medicine

## 2020-10-01 ENCOUNTER — Other Ambulatory Visit: Payer: Self-pay

## 2020-10-01 ENCOUNTER — Encounter: Payer: Self-pay | Admitting: Podiatry

## 2020-10-01 DIAGNOSIS — M79671 Pain in right foot: Secondary | ICD-10-CM

## 2020-10-01 DIAGNOSIS — M6281 Muscle weakness (generalized): Secondary | ICD-10-CM

## 2020-10-01 DIAGNOSIS — R2681 Unsteadiness on feet: Secondary | ICD-10-CM | POA: Diagnosis present

## 2020-10-01 DIAGNOSIS — M79604 Pain in right leg: Secondary | ICD-10-CM | POA: Diagnosis not present

## 2020-10-01 DIAGNOSIS — M79672 Pain in left foot: Secondary | ICD-10-CM

## 2020-10-01 DIAGNOSIS — R5383 Other fatigue: Secondary | ICD-10-CM | POA: Diagnosis present

## 2020-10-01 DIAGNOSIS — I739 Peripheral vascular disease, unspecified: Secondary | ICD-10-CM

## 2020-10-01 DIAGNOSIS — R29898 Other symptoms and signs involving the musculoskeletal system: Secondary | ICD-10-CM | POA: Diagnosis present

## 2020-10-01 DIAGNOSIS — R278 Other lack of coordination: Secondary | ICD-10-CM | POA: Diagnosis present

## 2020-10-01 DIAGNOSIS — I63511 Cerebral infarction due to unspecified occlusion or stenosis of right middle cerebral artery: Secondary | ICD-10-CM | POA: Diagnosis present

## 2020-10-01 DIAGNOSIS — R2689 Other abnormalities of gait and mobility: Secondary | ICD-10-CM | POA: Diagnosis present

## 2020-10-01 DIAGNOSIS — M79605 Pain in left leg: Secondary | ICD-10-CM

## 2020-10-01 DIAGNOSIS — M79674 Pain in right toe(s): Secondary | ICD-10-CM | POA: Insufficient documentation

## 2020-10-01 DIAGNOSIS — I70223 Atherosclerosis of native arteries of extremities with rest pain, bilateral legs: Secondary | ICD-10-CM | POA: Diagnosis not present

## 2020-10-01 MED ORDER — METHYLPREDNISOLONE 4 MG PO TBPK: ORAL_TABLET | ORAL | 0 refills | Status: DC

## 2020-10-01 NOTE — Patient Instructions (Signed)
Make an appointment with your vascular doctor to be re-evaluated    Place a pillow under your calves and keep pressure off the heels at night   Look for Voltaren gel at the pharmacy over the counter or online (also known as diclofenac 1% gel). Apply to the painful areas 3-4x daily with the supplied dosing card. Allow to dry for 10 minutes before going into socks/shoes

## 2020-10-01 NOTE — Therapy (Signed)
[place] MAIN [facility] SERVICES [address] [place], [place], [address] Phone: [phone]   Fax:  [phone]  Physical Therapy Treatment  Patient Details  Name: Jose Castro MRN: [id number] Date of Birth: 03-14-1926 Referring Provider (PT): Dr. Ancil Boozer   Encounter Date: 10/01/2020   PT End of Session - 10/01/20 1011    Visit Number 3    Number of Visits 25    Date for PT Re-Evaluation 12/13/20    Authorization Type eval: 09/20/20    PT Start Time 2355    PT Stop Time 1059    PT Time Calculation (min) 45 min    Equipment Utilized During Treatment Gait belt    Activity Tolerance Patient tolerated treatment well    Behavior During Therapy [facility] for tasks assessed/performed           Past Medical History:  Diagnosis Date  . Allergy    Penicillin  . Arthritis   . Basal cell carcinoma   . BPH (benign prostatic hyperplasia)   . Chronic kidney disease    had a kidney stone which per family was a cyst that was removed  . Dysrhythmia   . GERD (gastroesophageal reflux disease)   . History of kidney stones   . Hyperlipidemia   . Hypertension   . Left-sided low back pain with left-sided sciatica   . Lung mass   . Splenic vein thrombosis   . Stroke ([place]) 05/22/2019  . Thrombosis 12/2015   mural  area and no notation of heart attack    Past Surgical History:  Procedure Laterality Date  . APPENDECTOMY    . CATARACT EXTRACTION, BILATERAL    . CYSTOSCOPY WITH INSERTION OF UROLIFT    . ENDOSCOPIC RETROGRADE CHOLANGIOPANCREATOGRAPHY (ERCP) WITH PROPOFOL N/A 05/11/2020   Procedure: ENDOSCOPIC RETROGRADE CHOLANGIOPANCREATOGRAPHY (ERCP) WITH PROPOFOL;  Surgeon: [name], MD;  Location: [facility] ENDOSCOPY;  Service: Endoscopy;  Laterality: N/A;  . EYE SURGERY  08/2018  . IR EXCHANGE BILIARY DRAIN  05/04/2020  . KIDNEY STONE SURGERY    . KNEE SURGERY    . PACEMAKER INSERTION N/A 07/13/2018   Procedure: INSERTION PACEMAKER-DUEL CHAMBER INITIAL  IMPLANT;  Surgeon: Isaias Cowman, MD;  Location: [facility] ORS;  Service: Cardiovascular;  Laterality: N/A;  . SPINE SURGERY      There were no vitals filed for this visit.   Subjective Assessment - 10/01/20 1017    Subjective Patient reports he has a podiatry appointment today at 1. Has tingling in feet. Missed last session due to not feeling well.    Pertinent History Pt referred for deconditioning and gait instability. He has had a decline since he last finished therapy. Pt had a hospital admission from 05/02/20 to 05/14/20 for acute cholecystitis and is now s/p cholecystostomy drain placement. Drain is still in place and pt is waiting to hear when they plan to remove it. Pt discharged to SNF and then returned home. In addition pt underwent radiation therapy for a RUL lung mass/cancer and had his last radiation treatment in September of 2021. He reports that he has continued to walk at home as his primary exercise. Pt was previously seen at this clinic for PT/OT due to weakness/imbalance s/p CVA. Prior history from 09/21/19: Pt is a 86 year old male who presents with imbalance and difficulty with gait following a right frontal CVA with left hemiparesis on 05/22/19.  He has completed inpatient and home health PT, and now presents for OP PT.  He was discharged  from inpatient rehab at a supervision level of assistance, ambulating with a RW.  His family notes that he is impulsive and will try to ambulate without his RW at home.  Family is currently providing 24/7 supervision.    Limitations House hold activities;Walking    How long can you sit comfortably? no limitations    How long can you walk comfortably? he requires a RW    Patient Stated Goals Pt wants to be able to walk his granddaughter down the aisle at her wedding November 2022    Currently in Pain? No/denies               Treatment: NuStep level 3, 4 minutes for BUE and BLE coordination and general strengthening. RPM >50 for  cardiovascular training.   Standing in // Bars:  Airex Pad: normal BOS 30 sec x 2 bouts. CGA for safety. Patient demonstrates ankle righting reactions throughout task.  airex pad: horizontal head turns 10x, vertical head turns 10x , rest break between each head motion.   airex pad 6' step modified tandem stance 2x 45 seconds each LE placement occasional UE support   airex pad: reach to the left outside of BOS to grab and throw basketball into hoop for pertubations x 15 ball, occasional need for SUE support    With 2.5# Ankle Weights: Standing marches. PT cues for slow execution for maximum muscle activation. 10x each leg; cues for widening BOS  Standing hip extension 10x each LE, BUE support cues for upright posture and breathing   Hip abduction 4x length of // bars, challenging for L leg to increase step length, BUE support    Seated: 5xSTS with UE support     Pt educated throughout session about proper posture and technique with exercises. Improved exercise technique, movement at target joints, use of target muscles after min to mod verbal, visual, tactile cues.   rest breaks due to Sp02 dropping to 86%, returned to > 95% with seated rest breaks                       PT Education - 10/01/20 1011    Education Details exercise technique, body mechanics, stabilization    Person(s) Educated Patient    Methods Explanation;Demonstration;Verbal cues    Comprehension Verbalized understanding;Returned demonstration;Verbal cues required;Tactile cues required;Need further instruction            PT Short Term Goals - 09/20/20 1401      PT SHORT TERM GOAL #1   Title Pt will be independent with HEP in order to improve strength and balance in order to decrease fall risk and improve function at home.    Time 6    Period Weeks    Status New    Target Date 11/01/20             PT Long Term Goals - 09/20/20 1401      PT LONG TERM GOAL #1   Title Pt will improve  BERG by at least 3 points in order to demonstrate clinically significant improvement in balance.    Baseline 09/20/20: 32/56 (Previous discharge: 03/06/20: 46/56)    Time 12    Period Weeks    Status New    Target Date 12/13/20      PT LONG TERM GOAL #2   Title Pt will decrease TUG to below 14 seconds/decrease in order to demonstrate decreased fall risk.    Baseline 09/20/20: 19.2s    Time  12    Period Weeks    Status New    Target Date 12/13/20      PT LONG TERM GOAL #3   Title Pt will decrease 5TSTS by at least 3 seconds in order to demonstrate clinically significant improvement in LE strength.    Baseline 09/20/20: 23.1s with minA+1 for strength and balance (Previous discharge 03/06/20: 16.1s)    Time 12    Period Weeks    Status New    Target Date 12/13/20      PT LONG TERM GOAL #4   Title Pt will increase his self-selected 10MWT to >0.74 m/s in order to demonstrate improvement in gait speed and improved community ambulation    Baseline 09/20/20: self-selected: 14.4s = 0.69 m/s with rollator (Previous discharge 03/06/20: self-selected: 13.6s = 0.74 m/s)    Time 12    Period Weeks    Status New    Target Date 12/13/20                 Plan - 10/01/20 1105    Clinical Impression Statement Patient requires frequent monitoring of vitals throughout session due to Sp02 falling to 86% with exercise, he is able to return to >95% with rest breaks and cueing for breathing. LLE fatigues quickly and lateral movements are aggravating resulting in decreased stance phase. He will benefit from skilled PT services to address deficits in balance and decrease risk for future falls    Personal Factors and Comorbidities Age;Comorbidity 3+    Comorbidities HTN, CVA, cholecystitis with cholecystostomy drain    Examination-Activity Limitations Bend;Lift;Squat;Locomotion Level;Stairs;Stand    Examination-Participation Restrictions Community Activity;Driving;Laundry;Meal Prep;Shop     Stability/Clinical Decision Making Unstable/Unpredictable    Rehab Potential Fair    PT Frequency 2x / week    PT Duration 12 weeks    PT Treatment/Interventions ADLs/Self Care Home Management;Canalith Repostioning;Cryotherapy;Electrical Stimulation;Iontophoresis 4mg /ml Dexamethasone;Moist Heat;Traction;Ultrasound;DME Instruction;Gait training;Stair training;Therapeutic activities;Therapeutic exercise;Functional mobility training;Balance training;Neuromuscular re-education;Patient/family education;Manual techniques;Dry needling;Vestibular;Joint Manipulations;Spinal Manipulations;Aquatic Therapy    PT Next Visit Plan Consider 2MWT or 6MWT, initiate HEP, initiate balance and strengthening    PT Home Exercise Plan 09/20/20: None currently. Previous episode access code: [email or web address]    Consulted and Agree with Plan of Care Patient;Family member/caregiver    Family Member Consulted Daughter           Patient will benefit from skilled therapeutic intervention in order to improve the following deficits and impairments:  Abnormal gait, Decreased balance, Decreased mobility, Difficulty walking, Decreased knowledge of precautions, Decreased safety awareness, Decreased activity tolerance, Decreased strength, Decreased endurance  Visit Diagnosis: Muscle weakness (generalized)  Unsteadiness on feet  Other abnormalities of gait and mobility     Problem List Patient Active Problem List   Diagnosis Date Noted  . Non-small cell carcinoma of lung, right ([place]) 09/07/2020  . Mass of upper lobe of right lung 07/13/2020  . Calculus of bile duct with cholecystitis without obstruction   . Abnormal findings on imaging of biliary tract   . Acute cholecystitis 05/02/2020  . Chronic venous insufficiency 12/05/2019  . 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 CKD (chronic kidney disease), stage II   . New onset atrial fibrillation ([place])   . 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 Goals  of care, counseling/discussion   . Palliative care by specialist   . DNR (do not resuscitate) discussion   . History of CVA (cerebrovascular accident) 05/22/2019  . Persistent proteinuria 07/30/2018  . Mobitz  type 2 second degree heart block 07/13/2018  . Chronic kidney disease, stage III (moderate) ([place]) 06/15/2018  . Leg pain 05/06/2018  . Lymphedema 05/06/2018  . Coagulopathy ([place]) 07/07/2017  . Lumbar spondylosis 03/10/2017  . Elevated uric acid in blood 11/20/2016  . BPH (benign prostatic hyperplasia) 05/29/2016  . Peripheral vascular disease of lower extremity ([place]) 03/03/2016  . Gallstone 12/31/2015  . 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 Atherosclerosis of aorta ([place]) 11/22/2015  . Carotid artery narrowing 11/22/2015  . Diverticulosis of colon 11/22/2015  . Decreased creatinine clearance 11/22/2015  . Arthritis, degenerative 11/22/2015  . Lactose intolerance 11/22/2015  . Basal cell carcinoma 11/22/2015  . Essential hypertension 06/21/2015  . Hyperlipemia 06/21/2015  . GERD (gastroesophageal reflux disease) 06/21/2015  . Calculus of kidney 11/18/2013   Janna Arch, PT, DPT   10/01/2020, 11:06 AM  [place] MAIN [facility] SERVICES [address] [place], [place], [address] Phone: [phone]   Fax:  [phone]  Name: STEADMAN PROSPERI MRN: [id number] Date of Birth: Nov 22, 1926

## 2020-10-01 NOTE — Progress Notes (Signed)
  Subjective:  Patient ID: Jose Castro, male    DOB: [birth year],  MRN: [id number]  Chief Complaint  Patient presents with  . Foot Pain    Patient presents today bilat feet pain    86 y.o. male presents with the above complaint. History confirmed with patient.  Here today with his daughter.  He describes it from his heels up to his toes, it is very sensitive and he is even painful with bedsheets.  It does not hurt nearly as much when he is up and walking.  The right is worse than the left.  No treatment thus far.  It feels different than the gout flare she has had previously that was treated by Dr. Amalia Hailey  Objective:  Physical Exam: The bilateral lower extremities are cool to touch with thin shiny skin, there is minimal edema.  Not able to palpate his pulses.  5 out of 5 strength in all planes with resistance.  He has no pain on palpation of the posterior or inferior heel or in the plantar fascia or midfoot   Assessment:   1. Peripheral vascular disease of lower extremity ([place])   2. Bilateral pain of leg and foot   3. Rest pain of both lower extremities due to atherosclerosis [facility])      Plan:  Patient was evaluated and treated and all questions answered.  -It is unclear to me exactly what his symptoms are.  Is possible that he is having small intermittent gout flares but I think this is unlikely that it feels very different to him.  -Possible explanation would be rest pain at night.  He has a Hydrograph[employer] and has seen them before.  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  I encouraged him to speak with her radiation  oncologist about this  -Methylprednisolone taper sent to pharmacy for inflammation and pain relief the time being  -Recommended Voltaren gel use  No follow-ups on file.

## 2020-10-02 NOTE — Telephone Encounter (Signed)
Called patient's sister [name]. Left vm, no answer.

## 2020-10-03 ENCOUNTER — Telehealth (INDEPENDENT_AMBULATORY_CARE_PROVIDER_SITE_OTHER): Payer: Self-pay

## 2020-10-03 ENCOUNTER — Other Ambulatory Visit: Payer: Self-pay | Admitting: General Surgery

## 2020-10-03 ENCOUNTER — Other Ambulatory Visit: Payer: Self-pay

## 2020-10-03 ENCOUNTER — Ambulatory Visit: Payer: Medicare Other

## 2020-10-03 DIAGNOSIS — M79674 Pain in right toe(s): Secondary | ICD-10-CM

## 2020-10-03 DIAGNOSIS — K819 Cholecystitis, unspecified: Secondary | ICD-10-CM

## 2020-10-03 DIAGNOSIS — I63511 Cerebral infarction due to unspecified occlusion or stenosis of right middle cerebral artery: Secondary | ICD-10-CM

## 2020-10-03 DIAGNOSIS — R278 Other lack of coordination: Secondary | ICD-10-CM

## 2020-10-03 DIAGNOSIS — R29898 Other symptoms and signs involving the musculoskeletal system: Secondary | ICD-10-CM

## 2020-10-03 DIAGNOSIS — M6281 Muscle weakness (generalized): Secondary | ICD-10-CM

## 2020-10-03 DIAGNOSIS — R2689 Other abnormalities of gait and mobility: Secondary | ICD-10-CM

## 2020-10-03 DIAGNOSIS — R2681 Unsteadiness on feet: Secondary | ICD-10-CM

## 2020-10-03 DIAGNOSIS — R5383 Other fatigue: Secondary | ICD-10-CM

## 2020-10-03 NOTE — Telephone Encounter (Signed)
Can you please schedule the pt per the NP.

## 2020-10-03 NOTE — Telephone Encounter (Signed)
The pt's daughter called and left a VM on the nurses line concerned about her father's feet. I returned the pt's  Daughter's call and she made me aware that the pt has been  having pain in both of his heels for a week. The pt has recently undergone radiation for lung cancer. The pt is not having any swelling, or wounds to his heels. The pt does have a Hx of blood clots the pt is not having any color changes to his feet however they are warm to the touch. The pt is having no trouble walking  But he is having numbness in the Rt leg. The pt went to [facility] and was told that he does not have neuropathy and they couldn't figure out why the pt was having this pain. Please advise.

## 2020-10-03 NOTE — Telephone Encounter (Signed)
I read podiatry's note and it does state that there is the possibility of radiation induced neuropathy.  That is a common cause for neuropathy. However, he does have peripheral artery disease and it could be from a decrease in blood flow.  We can bring him in for ABIs to see if there is a vascular component to his pain. Me or GS

## 2020-10-03 NOTE — Telephone Encounter (Signed)
I called the pt's daughter and left a VM with the NP's instructions.

## 2020-10-03 NOTE — Therapy (Signed)
[place] MAIN [facility] SERVICES [address] [place], [place], [address] Phone: [phone]   Fax:  [phone]  Physical Therapy Treatment  Patient Details  Name: Jose Castro MRN: [id number] Date of Birth: [birth year] Referring Provider (PT): Dr. Ancil Boozer   Encounter Date: 10/03/2020   PT End of Session - 10/03/20 1202    Visit Number 4    Number of Visits 25    Date for PT Re-Evaluation 12/13/20    Authorization Type eval: 09/20/20    PT Start Time 1154   arrived late, coming from medical appt   PT Stop Time 1232    PT Time Calculation (min) 38 min    Equipment Utilized During Treatment Gait belt    Activity Tolerance Patient tolerated treatment well;Patient limited by fatigue;Patient limited by pain    Behavior During Therapy [facility] for tasks assessed/performed           Past Medical History:  Diagnosis Date  . Allergy    Penicillin  . Arthritis   . Basal cell carcinoma   . BPH (benign prostatic hyperplasia)   . Chronic kidney disease    had a kidney stone which per family was a cyst that was removed  . Dysrhythmia   . GERD (gastroesophageal reflux disease)   . History of kidney stones   . Hyperlipidemia   . Hypertension   . Left-sided low back pain with left-sided sciatica   . Lung mass   . Splenic vein thrombosis   . Stroke ([place]) 05/22/2019  . Thrombosis 12/2015   mural  area and no notation of heart attack    Past Surgical History:  Procedure Laterality Date  . APPENDECTOMY    . CATARACT EXTRACTION, BILATERAL    . CYSTOSCOPY WITH INSERTION OF UROLIFT    . ENDOSCOPIC RETROGRADE CHOLANGIOPANCREATOGRAPHY (ERCP) WITH PROPOFOL N/A 05/11/2020   Procedure: ENDOSCOPIC RETROGRADE CHOLANGIOPANCREATOGRAPHY (ERCP) WITH PROPOFOL;  Surgeon: [name], MD;  Location: [facility] ENDOSCOPY;  Service: Endoscopy;  Laterality: N/A;  . EYE SURGERY  08/2018  . IR EXCHANGE BILIARY DRAIN  05/04/2020  . KIDNEY STONE SURGERY    . KNEE SURGERY    .  PACEMAKER INSERTION N/A 07/13/2018   Procedure: INSERTION PACEMAKER-DUEL CHAMBER INITIAL IMPLANT;  Surgeon: Isaias Cowman, MD;  Location: [facility] ORS;  Service: Cardiovascular;  Laterality: N/A;  . SPINE SURGERY      There were no vitals filed for this visit.   Subjective Assessment - 10/03/20 1200    Subjective Pt reports aknee injection earlier today, MD cleared pt for PT s/p. Pt conitnues to have same pain in heels/toes. Pt reports his chole drain may be able to come out next week.    Pertinent History Pt referred for deconditioning and gait instability. He has had a decline since he last finished therapy. Pt had a hospital admission from 05/02/20 to 05/14/20 for acute cholecystitis and is now s/p cholecystostomy drain placement. Drain is still in place and pt is waiting to hear when they plan to remove it. Pt discharged to SNF and then returned home. In addition pt underwent radiation therapy for a RUL lung mass/cancer and had his last radiation treatment in September of 2021. He reports that he has continued to walk at home as his primary exercise. Pt was previously seen at this clinic for PT/OT due to weakness/imbalance s/p CVA. Prior history from 09/21/19: Pt is a 86 year old male who presents with imbalance and difficulty with gait following a right frontal CVA  with left hemiparesis on 05/22/19.  He has completed inpatient and home health PT, and now presents for OP PT.  He was discharged from inpatient rehab at a supervision level of assistance, ambulating with a RW.  His family notes that he is impulsive and will try to ambulate without his RW at home.  Family is currently providing 24/7 supervision.    Currently in Pain? --   does not rate when cued             [facility] PT Assessment - 10/03/20 0001      Ambulation/Gait   Gait Comments 2MWT: RW, minGuard, 147ft            Intervention this date: NuStep level 3, 4 minutes for BUE and BLE coordination and general strengthening. RPM  >50 for cardiovascular training.   Standing in // Bars: -airex horizontal headturns -airex semitandem stance alternating bilat -AMB 12ft c RW(minGuardA) -AMB 26ft c 5lb AW bilat (minGuardA) -AMB 251ft c RW (minGuardA) -Sidestepping // bars c 5lb AW, 1x58ft bilat, pain limited       PT Short Term Goals - 09/20/20 1401      PT SHORT TERM GOAL #1   Title Pt will be independent with HEP in order to improve strength and balance in order to decrease fall risk and improve function at home.    Time 6    Period Weeks    Status New    Target Date 11/01/20             PT Long Term Goals - 09/20/20 1401      PT LONG TERM GOAL #1   Title Pt will improve BERG by at least 3 points in order to demonstrate clinically significant improvement in balance.    Baseline 09/20/20: 32/56 (Previous discharge: 03/06/20: 46/56)    Time 12    Period Weeks    Status New    Target Date 12/13/20      PT LONG TERM GOAL #2   Title Pt will decrease TUG to below 14 seconds/decrease in order to demonstrate decreased fall risk.    Baseline 09/20/20: 19.2s    Time 12    Period Weeks    Status New    Target Date 12/13/20      PT LONG TERM GOAL #3   Title Pt will decrease 5TSTS by at least 3 seconds in order to demonstrate clinically significant improvement in LE strength.    Baseline 09/20/20: 23.1s with minA+1 for strength and balance (Previous discharge 03/06/20: 16.1s)    Time 12    Period Weeks    Status New    Target Date 12/13/20      PT LONG TERM GOAL #4   Title Pt will increase his self-selected 10MWT to >0.74 m/s in order to demonstrate improvement in gait speed and improved community ambulation    Baseline 09/20/20: self-selected: 14.4s = 0.69 m/s with rollator (Previous discharge 03/06/20: self-selected: 13.6s = 0.74 m/s)    Time 12    Period Weeks    Status New    Target Date 12/13/20                 Plan - 10/03/20 1213    Clinical Impression Statement Continued with POC  addressing weakness, unsteadiness, difficulty with activity tolerance. Pt tolerates session fair, but is limited by fatigue and aggravated pain in knee with differen tstanding postures. Balance activity performed with minguard assist but pt often able to use //  bars for trunk righting. Pt remain motivated to improve his funcitonal safety and independence. Commenced 2MWT at end of session, but progressive knee pain precludes ability to tolerate 6MWT.    Personal Factors and Comorbidities Age;Comorbidity 3+    Comorbidities HTN, CVA, cholecystitis with cholecystostomy drain    Examination-Activity Limitations Bend;Lift;Squat;Locomotion Level;Stairs;Stand    Examination-Participation Restrictions Community Activity;Driving;Laundry;Meal Prep;Shop    Stability/Clinical Decision Making Unstable/Unpredictable    Clinical Decision Making High    Rehab Potential Fair    PT Frequency 2x / week    PT Duration 12 weeks    PT Treatment/Interventions ADLs/Self Care Home Management;Canalith Repostioning;Cryotherapy;Electrical Stimulation;Iontophoresis 4mg /ml Dexamethasone;Moist Heat;Traction;Ultrasound;DME Instruction;Gait training;Stair training;Therapeutic activities;Therapeutic exercise;Functional mobility training;Balance training;Neuromuscular re-education;Patient/family education;Manual techniques;Dry needling;Vestibular;Joint Manipulations;Spinal Manipulations;Aquatic Therapy    PT Next Visit Plan 2MWT    PT Home Exercise Plan 09/20/20: None currently. Previous episode access code: [email or web address]    Consulted and Agree with Plan of Care Patient           Patient will benefit from skilled therapeutic intervention in order to improve the following deficits and impairments:  Abnormal gait, Decreased balance, Decreased mobility, Difficulty walking, Decreased knowledge of precautions, Decreased safety awareness, Decreased activity tolerance, Decreased strength, Decreased endurance  Visit Diagnosis: Muscle  weakness (generalized)  Unsteadiness on feet  Other abnormalities of gait and mobility  Decreased strength of lower extremity  Other lack of coordination  Lethargy  Right middle cerebral artery stroke (HCC)  Great toe pain, right     Problem List Patient Active Problem List   Diagnosis Date Noted  . Non-small cell carcinoma of lung, right ([place]) 09/07/2020  . Mass of upper lobe of right lung 07/13/2020  . Calculus of bile duct with cholecystitis without obstruction   . Abnormal findings on imaging of biliary tract   . Acute cholecystitis 05/02/2020  . Chronic venous insufficiency 12/05/2019  . 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 CKD (chronic kidney disease), stage II   . New onset atrial fibrillation ([place])   . Right middle cerebral artery stroke ([place]) 05/27/2019  . Goals of care, counseling/discussion   . Palliative care by specialist   . DNR (do not resuscitate) discussion   . History of CVA (cerebrovascular accident) 05/22/2019  . Persistent proteinuria 07/30/2018  . 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 Chronic kidney disease, stage III (moderate) ([place]) 06/15/2018  . Leg pain 05/06/2018  . Lymphedema 05/06/2018  . Coagulopathy ([place]) 07/07/2017  . Lumbar spondylosis 03/10/2017  . Elevated uric acid in blood 11/20/2016  . BPH (benign prostatic hyperplasia) 05/29/2016  . Peripheral vascular disease of lower extremity ([place]) 03/03/2016  . Gallstone 12/31/2015  . 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 Atherosclerosis of aorta ([place]) 11/22/2015  . Carotid artery narrowing 11/22/2015  . Diverticulosis of colon 11/22/2015  . Decreased creatinine clearance 11/22/2015  . Arthritis, degenerative 11/22/2015  . Lactose intolerance 11/22/2015  . Basal cell carcinoma 11/22/2015  . Essential hypertension 06/21/2015  . Hyperlipemia 06/21/2015  . GERD (gastroesophageal reflux disease) 06/21/2015  . Calculus of kidney  11/18/2013   12:54 PM, 10/03/20 Etta Grandchild, PT, DPT Physical Therapist - [facility]  Outpatient Physical Therapy- [place] [facility] 10/03/2020, 12:53 PM  [place] MAIN [facility] SERVICES [address] [place], [place], [address] Phone: [phone]   Fax:  [phone]  Name: Jose Castro MRN:  [id number] Date of Birth: [birth year]

## 2020-10-05 ENCOUNTER — Other Ambulatory Visit: Payer: Self-pay

## 2020-10-05 ENCOUNTER — Ambulatory Visit
Admission: RE | Admit: 2020-10-05 | Discharge: 2020-10-05 | Disposition: A | Discharge status: Home / Self Care | Payer: Medicare Other | Source: Ambulatory Visit | Attending: General Surgery | Admitting: General Surgery

## 2020-10-05 DIAGNOSIS — K819 Cholecystitis, unspecified: Secondary | ICD-10-CM | POA: Diagnosis not present

## 2020-10-05 MED ORDER — IOHEXOL 300 MG/ML  SOLN
10.0000 mL | Freq: Once | INTRAMUSCULAR | Status: AC | PRN
  Administered 2020-10-05: 10 mL

## 2020-10-05 NOTE — Telephone Encounter (Signed)
PATIENT SCHEDULED  °

## 2020-10-10 ENCOUNTER — Other Ambulatory Visit (INDEPENDENT_AMBULATORY_CARE_PROVIDER_SITE_OTHER): Payer: Self-pay | Admitting: Nurse Practitioner

## 2020-10-10 ENCOUNTER — Other Ambulatory Visit: Payer: Self-pay

## 2020-10-10 ENCOUNTER — Ambulatory Visit: Payer: Medicare Other | Admitting: Physical Therapy

## 2020-10-10 ENCOUNTER — Other Ambulatory Visit (INDEPENDENT_AMBULATORY_CARE_PROVIDER_SITE_OTHER): Payer: Self-pay | Admitting: Vascular Surgery

## 2020-10-10 ENCOUNTER — Encounter: Payer: Self-pay | Admitting: Physical Therapy

## 2020-10-10 DIAGNOSIS — R29898 Other symptoms and signs involving the musculoskeletal system: Secondary | ICD-10-CM

## 2020-10-10 DIAGNOSIS — R278 Other lack of coordination: Secondary | ICD-10-CM

## 2020-10-10 DIAGNOSIS — M6281 Muscle weakness (generalized): Secondary | ICD-10-CM | POA: Diagnosis not present

## 2020-10-10 DIAGNOSIS — R200 Anesthesia of skin: Secondary | ICD-10-CM

## 2020-10-10 DIAGNOSIS — R2681 Unsteadiness on feet: Secondary | ICD-10-CM

## 2020-10-10 DIAGNOSIS — R2689 Other abnormalities of gait and mobility: Secondary | ICD-10-CM

## 2020-10-10 DIAGNOSIS — I739 Peripheral vascular disease, unspecified: Secondary | ICD-10-CM

## 2020-10-10 NOTE — Therapy (Signed)
[place] MAIN [facility] SERVICES [address] [place], [place], [address] Phone: [phone]   Fax:  [phone]  Physical Therapy Treatment  Patient Details  Name: Jose Castro MRN: [id number] Date of Birth: [birth year] Referring Provider (PT): Dr. Ancil Boozer   Encounter Date: 10/10/2020   PT End of Session - 10/10/20 1157    Visit Number 5    Number of Visits 25    Date for PT Re-Evaluation 12/13/20    Authorization Type eval: 09/20/20    PT Start Time 1148    PT Stop Time 1230    PT Time Calculation (min) 42 min    Equipment Utilized During Treatment Gait belt    Activity Tolerance Patient tolerated treatment well;Patient limited by fatigue;Patient limited by pain    Behavior During Therapy [facility] for tasks assessed/performed           Past Medical History:  Diagnosis Date  . Allergy    Penicillin  . Arthritis   . Basal cell carcinoma   . BPH (benign prostatic hyperplasia)   . Chronic kidney disease    had a kidney stone which per family was a cyst that was removed  . Dysrhythmia   . GERD (gastroesophageal reflux disease)   . History of kidney stones   . Hyperlipidemia   . Hypertension   . Left-sided low back pain with left-sided sciatica   . Lung mass   . Splenic vein thrombosis   . Stroke ([place]) 05/22/2019  . Thrombosis 12/2015   mural  area and no notation of heart attack    Past Surgical History:  Procedure Laterality Date  . APPENDECTOMY    . CATARACT EXTRACTION, BILATERAL    . CYSTOSCOPY WITH INSERTION OF UROLIFT    . ENDOSCOPIC RETROGRADE CHOLANGIOPANCREATOGRAPHY (ERCP) WITH PROPOFOL N/A 05/11/2020   Procedure: ENDOSCOPIC RETROGRADE CHOLANGIOPANCREATOGRAPHY (ERCP) WITH PROPOFOL;  Surgeon: [name], MD;  Location: [facility] ENDOSCOPY;  Service: Endoscopy;  Laterality: N/A;  . EYE SURGERY  08/2018  . IR EXCHANGE BILIARY DRAIN  05/04/2020  . KIDNEY STONE SURGERY    . KNEE SURGERY    . PACEMAKER INSERTION N/A 07/13/2018    Procedure: INSERTION PACEMAKER-DUEL CHAMBER INITIAL IMPLANT;  Surgeon: Isaias Cowman, MD;  Location: [facility] ORS;  Service: Cardiovascular;  Laterality: N/A;  . SPINE SURGERY      There were no vitals filed for this visit.   Subjective Assessment - 10/10/20 1156    Subjective Patient reports doing well; Denies any pain; Reports adherence with HEP including walking; Pt reports he still has the chole drain, he states they are still debating what to do.    Pertinent History Pt referred for deconditioning and gait instability. He has had a decline since he last finished therapy. Pt had a hospital admission from 05/02/20 to 05/14/20 for acute cholecystitis and is now s/p cholecystostomy drain placement. Drain is still in place and pt is waiting to hear when they plan to remove it. Pt discharged to SNF and then returned home. In addition pt underwent radiation therapy for a RUL lung mass/cancer and had his last radiation treatment in September of 2021. He reports that he has continued to walk at home as his primary exercise. Pt was previously seen at this clinic for PT/OT due to weakness/imbalance s/p CVA. Prior history from 09/21/19: Pt is a 86 year old male who presents with imbalance and difficulty with gait following a right frontal CVA with left hemiparesis on 05/22/19.  He has completed inpatient  and home health PT, and now presents for OP PT.  He was discharged from inpatient rehab at a supervision level of assistance, ambulating with a RW.  His family notes that he is impulsive and will try to ambulate without his RW at home.  Family is currently providing 24/7 supervision.    Currently in Pain? No/denies    Multiple Pain Sites No                   Treatment: NuStep level 2, 4 minutes for BUE and BLE coordination and general strengthening. RPM >50 for cardiovascular training.  Standing in // Bars:  Airex Pad: narrow BOS 30 sec x 2 bouts. CGA for safety. Patient demonstrates ankle  righting reactions throughout task.  airex pad: horizontal head turns 5x, with narrow base of support and cues for gaze stabilization, required min A for safety;  airex pad/ 6' step modified tandem stance x30 seconds each LE placement occasional UE support   Progressed to alternate UE lift x10 reps each foot on step, patient did have difficulty standing with LLE due to knee discomfort and severe OA.   Standing on airex: Alternate LE toe taps x15 reps each LE with 2-1 rail assist;    With 2.5# Ankle Weights: Stepping over orange hurdle forward/backward x10 reps; Side stepping over orange hurdle x10 reps; Required 2 HHA on rail assist with cues for increased step length for better foot clearance;   Standing marches. PT cues for slow execution for maximum muscle activation. 10x each leg; cues for widening BOS  Gait around gym on level surface x200 feet with RW with cues to increase erect posture, increase step length for better reciprocal gait pattern; Patient often walks with flexed posture with short shuffled step length;   Pt educated throughout session about proper posture and technique with exercises. Improved exercise technique, movement at target joints, use of target muscles after min to mod verbal, visual, tactile cues.  Vitals monitored throughout session, BP was 102/58, HR 67, SPo2 99%; at one point SPO2 dropped to mid 80's but concerned about accuracy as patient has poor perfusion with cold hands;                       PT Education - 10/10/20 1157    Education Details exercise technique, body mechanics, HEP    Person(s) Educated Patient    Methods Explanation;Verbal cues    Comprehension Returned demonstration;Verbal cues required;Verbalized understanding;Need further instruction            PT Short Term Goals - 09/20/20 1401      PT SHORT TERM GOAL #1   Title Pt will be independent with HEP in order to improve strength and balance in order to  decrease fall risk and improve function at home.    Time 6    Period Weeks    Status New    Target Date 11/01/20             PT Long Term Goals - 09/20/20 1401      PT LONG TERM GOAL #1   Title Pt will improve BERG by at least 3 points in order to demonstrate clinically significant improvement in balance.    Baseline 09/20/20: 32/56 (Previous discharge: 03/06/20: 46/56)    Time 12    Period Weeks    Status New    Target Date 12/13/20      PT LONG TERM GOAL #2   Title Pt  will decrease TUG to below 14 seconds/decrease in order to demonstrate decreased fall risk.    Baseline 09/20/20: 19.2s    Time 12    Period Weeks    Status New    Target Date 12/13/20      PT LONG TERM GOAL #3   Title Pt will decrease 5TSTS by at least 3 seconds in order to demonstrate clinically significant improvement in LE strength.    Baseline 09/20/20: 23.1s with minA+1 for strength and balance (Previous discharge 03/06/20: 16.1s)    Time 12    Period Weeks    Status New    Target Date 12/13/20      PT LONG TERM GOAL #4   Title Pt will increase his self-selected 10MWT to >0.74 m/s in order to demonstrate improvement in gait speed and improved community ambulation    Baseline 09/20/20: self-selected: 14.4s = 0.69 m/s with rollator (Previous discharge 03/06/20: self-selected: 13.6s = 0.74 m/s)    Time 12    Period Weeks    Status New    Target Date 12/13/20                 Plan - 10/10/20 1438    Clinical Impression Statement Patient motivated and participated well within session. He was instructed in advanced balance exercise with instruction in narrow base of support on uneven surface. He was instructed to reduce rail assist to facilitate increased LE weight bearing and improve stance control. Patient does report increased LLE knee discomfort with additional weight bearing. Patient does require min A for most balance exercise for safety. Patient instructed in advanced LE strengthening. He did  fatigue with prolonged standing requiring short seated rest breaks. Patient does require cues for increased erect posture and increased step length with gait for reciprocal gait pattern. He would benefit from additional skilled PT Intervention to improve strength, balance and mobility;    Personal Factors and Comorbidities Age;Comorbidity 3+    Comorbidities HTN, CVA, cholecystitis with cholecystostomy drain    Examination-Activity Limitations Bend;Lift;Squat;Locomotion Level;Stairs;Stand    Examination-Participation Restrictions Community Activity;Driving;Laundry;Meal Prep;Shop    Stability/Clinical Decision Making Unstable/Unpredictable    Rehab Potential Fair    PT Frequency 2x / week    PT Duration 12 weeks    PT Treatment/Interventions ADLs/Self Care Home Management;Canalith Repostioning;Cryotherapy;Electrical Stimulation;Iontophoresis 4mg /ml Dexamethasone;Moist Heat;Traction;Ultrasound;DME Instruction;Gait training;Stair training;Therapeutic activities;Therapeutic exercise;Functional mobility training;Balance training;Neuromuscular re-education;Patient/family education;Manual techniques;Dry needling;Vestibular;Joint Manipulations;Spinal Manipulations;Aquatic Therapy    PT Next Visit Plan 2MWT    PT Home Exercise Plan 09/20/20: None currently. Previous episode access code: [email or web address]    Consulted and Agree with Plan of Care Patient           Patient will benefit from skilled therapeutic intervention in order to improve the following deficits and impairments:  Abnormal gait, Decreased balance, Decreased mobility, Difficulty walking, Decreased knowledge of precautions, Decreased safety awareness, Decreased activity tolerance, Decreased strength, Decreased endurance  Visit Diagnosis: Muscle weakness (generalized)  Unsteadiness on feet  Other abnormalities of gait and mobility  Decreased strength of lower extremity  Other lack of coordination     Problem List Patient Active Problem  List   Diagnosis Date Noted  . Non-small cell carcinoma of lung, right ([place]) 09/07/2020  . Mass of upper lobe of right lung 07/13/2020  . Calculus of bile duct with cholecystitis without obstruction   . Abnormal findings on imaging of biliary tract   . Acute cholecystitis 05/02/2020  . Chronic venous insufficiency 12/05/2019  . Osteoarthritis of joint of  toe of right foot   . CKD (chronic kidney disease), stage II   . New onset atrial fibrillation ([place])   . Right middle cerebral artery stroke ([place]) 05/27/2019  . Goals of care, counseling/discussion   . Palliative care by specialist   . DNR (do not resuscitate) discussion   . History of CVA (cerebrovascular accident) 05/22/2019  . Persistent proteinuria 07/30/2018  . 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 Chronic kidney disease, stage III (moderate) ([place]) 06/15/2018  . Leg pain 05/06/2018  . Lymphedema 05/06/2018  . Coagulopathy ([place]) 07/07/2017  . Lumbar spondylosis 03/10/2017  . Elevated uric acid in blood 11/20/2016  . BPH (benign prostatic hyperplasia) 05/29/2016  . Peripheral vascular disease of lower extremity ([place]) 03/03/2016  . Gallstone 12/31/2015  . 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 Atherosclerosis of aorta ([place]) 11/22/2015  . Carotid artery narrowing 11/22/2015  . Diverticulosis of colon 11/22/2015  . Decreased creatinine clearance 11/22/2015  . Arthritis, degenerative 11/22/2015  . Lactose intolerance 11/22/2015  . Basal cell carcinoma 11/22/2015  . Essential hypertension 06/21/2015  . Hyperlipemia 06/21/2015  . GERD (gastroesophageal reflux disease) 06/21/2015  . Calculus of kidney 11/18/2013    Geordie Nooney PT, DPT 10/10/2020, 2:47 PM  [place] MAIN [facility] SERVICES [address] [place], [place], [address] Phone: [phone]   Fax:  [phone]  Name: Jose Castro MRN: [id number] Date of Birth: 1926-07-02

## 2020-10-12 ENCOUNTER — Encounter (INDEPENDENT_AMBULATORY_CARE_PROVIDER_SITE_OTHER): Payer: Self-pay | Admitting: Nurse Practitioner

## 2020-10-12 ENCOUNTER — Other Ambulatory Visit: Payer: Self-pay

## 2020-10-12 ENCOUNTER — Ambulatory Visit (INDEPENDENT_AMBULATORY_CARE_PROVIDER_SITE_OTHER): Payer: Medicare Other

## 2020-10-12 ENCOUNTER — Ambulatory Visit (INDEPENDENT_AMBULATORY_CARE_PROVIDER_SITE_OTHER): Payer: Medicare Other | Admitting: Nurse Practitioner

## 2020-10-12 VITALS — BP 114/73 | HR 87 | Ht 70.0 in | Wt 154.0 lb

## 2020-10-12 DIAGNOSIS — R200 Anesthesia of skin: Secondary | ICD-10-CM

## 2020-10-12 DIAGNOSIS — I10 Essential (primary) hypertension: Secondary | ICD-10-CM | POA: Diagnosis not present

## 2020-10-12 DIAGNOSIS — E782 Mixed hyperlipidemia: Secondary | ICD-10-CM | POA: Diagnosis not present

## 2020-10-12 DIAGNOSIS — I739 Peripheral vascular disease, unspecified: Secondary | ICD-10-CM

## 2020-10-14 ENCOUNTER — Encounter (INDEPENDENT_AMBULATORY_CARE_PROVIDER_SITE_OTHER): Payer: Self-pay | Admitting: Nurse Practitioner

## 2020-10-14 NOTE — Progress Notes (Signed)
Subjective:    Patient ID: Jose Castro, male    DOB: 11/26/1926, 86 y.o.   MRN: [id number] Chief Complaint  Patient presents with  . Follow-up    Addon  per phone note abi    The patient presents today for follow-up evaluation regarding leg right lower extremity numbness.  The patient's daughter was concerned due to complaints of numbness in his right foot.  The patient notes that the numbness happens more so at night and it is in his toes and foot area.  He notes that this pain comes and goes.  He notes that if he stands or walks or moves his foot around the pain tends to go away.  This pain does not happen consistently.  He denies any claudication or rest pain like symptoms.  The patient has had a previous history of a spinal fusion about 7 to 8 years ago.  He is also underwent radiation recently for a nodule in his lung.  He denies any fever, chills, nausea or vomiting.  He denies any discoloration.  Today noninvasive studies show an ABI of 1.19 on the right with a TBI 0.67.  The left shows an ABI of 0.95 with a TBI 0.49.  The patient has biphasic tibial artery waveforms with good toe waveforms in the right lower extremity left has monophasic/biphasic waveforms with good toe waveforms.  The patient's bilateral ABIs are actually increased from his previous study on 12/21/2019   Review of Systems  Cardiovascular: Positive for leg swelling.  Neurological: Positive for weakness.  All other systems reviewed and are negative.      Objective:   Physical Exam Vitals reviewed.  HENT:     Head: Normocephalic.  Cardiovascular:     Rate and Rhythm: Normal rate.     Pulses:          Dorsalis pedis pulses are 1+ on the right side and 1+ on the left side.       Posterior tibial pulses are 1+ on the right side and 1+ on the left side.  Pulmonary:     Effort: Pulmonary effort is normal.  Skin:    General: Skin is warm and dry.  Neurological:     Mental Status: He is alert and oriented to  person, place, and time. Mental status is at baseline.  Psychiatric:        Mood and Affect: Mood normal.        Behavior: Behavior normal.        Thought Content: Thought content normal.        Judgment: Judgment normal.     BP 114/73   Pulse 87   Ht 5\' 10"  (1.778 m)   Wt 154 lb (69.9 kg)   BMI 22.10 kg/m   Past Medical History:  Diagnosis Date  . Allergy    Penicillin  . Arthritis   . Basal cell carcinoma   . BPH (benign prostatic hyperplasia)   . Chronic kidney disease    had a kidney stone which per family was a cyst that was removed  . Dysrhythmia   . GERD (gastroesophageal reflux disease)   . History of kidney stones   . Hyperlipidemia   . Hypertension   . Left-sided low back pain with left-sided sciatica   . Lung mass   . Splenic vein thrombosis   . Stroke ([place]) 05/22/2019  . Thrombosis 12/2015   mural  area and no notation of heart attack    Social  History   Socioeconomic History  . Marital status: Widowed    Spouse name: Not on file  . Number of children: 6  . Years of education: Not on file  . Highest education level: Some college, no degree  Occupational History  . 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 Smoking status: Former Smoker    Packs/day: 0.00    Years: 16.00    Pack years: 0.00    Types: Cigarettes    Start date: 10/21/1944    Quit date: 10/21/1960    Years since quitting: 60.0  . Smokeless tobacco: Never Used  Vaping Use  . Vaping Use: Never used  Substance and Sexual Activity  . Alcohol use: Yes    Alcohol/week: 3.0 standard drinks    Types: 3 Standard drinks or equivalent per week    Comment: once a week when he goes out to dinner-socially  . Drug use: No  . Sexual activity: Yes    Partners: Female  Other Topics Concern  . Not on file  Social History Narrative   Widow, lives alone but 4 of his children rotate staying with him since July 2020 due to stroke and he is not left alone  but does most things independently.       Smoke 15-20 years; quit 60 years ago. Rare alcohol. 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    Social Determinants of Health   Financial Resource Strain: Low Risk   . Difficulty of Paying Living Expenses: Not hard at all  Food Insecurity: No Food Insecurity  . Worried About Charity fundraiser in the Last Year: Never true  . Ran Out of Food in the Last Year: Never true  Transportation Needs: No Transportation Needs  . Lack of Transportation (Medical): No  . Lack of Transportation (Non-Medical): No  Physical Activity: Inactive  . Days of Exercise per Week: 0 days  . Minutes of Exercise per Session: 0 min  Stress: No Stress Concern Present  . Feeling of Stress : Not at all  Social Connections: Moderately Integrated  . Frequency of Communication with Friends and Family: More than three times a week  . Frequency of Social Gatherings with Friends and Family: Twice a week  . Attends Religious Services: More than 4 times per year  . Active Member of Clubs or Organizations: Yes  . Attends Archivist Meetings: More than 4 times per year  . Marital Status: Widowed  Intimate Partner Violence: Not At Risk  . Fear of Current or Ex-Partner: No  . Emotionally Abused: No  . Physically Abused: No  . Sexually Abused: No    Past Surgical History:  Procedure Laterality Date  . APPENDECTOMY    . CATARACT EXTRACTION, BILATERAL    . CYSTOSCOPY WITH INSERTION OF UROLIFT    . ENDOSCOPIC RETROGRADE CHOLANGIOPANCREATOGRAPHY (ERCP) WITH PROPOFOL N/A 05/11/2020   Procedure: ENDOSCOPIC RETROGRADE CHOLANGIOPANCREATOGRAPHY (ERCP) WITH PROPOFOL;  Surgeon: [name], MD;  Location: [facility] ENDOSCOPY;  Service: Endoscopy;  Laterality: N/A;  . EYE SURGERY  08/2018  . IR EXCHANGE BILIARY DRAIN  05/04/2020  . KIDNEY STONE SURGERY    . KNEE SURGERY    . PACEMAKER INSERTION N/A 07/13/2018   Procedure: INSERTION PACEMAKER-DUEL CHAMBER INITIAL IMPLANT;  Surgeon: Isaias Cowman,  MD;  Location: [facility] ORS;  Service: Cardiovascular;  Laterality: N/A;  . SPINE SURGERY      Family History  Problem Relation Age of Onset  .  Cancer Brother        bladder cancer with mets  . Heart disease Mother   . Aortic aneurysm Mother   . Heart attack Maternal Aunt   . Heart attack Maternal Uncle     Allergies  Allergen Reactions  . Penicillins Itching and Swelling    Pt reported arm rash and swelling with penicillin administration while he was in the [employer].  However, pt was able to tolerate IV Unasyn without any problem, so penicillin allergy unlikely to be real or serious.  05/14/2020.    CBC Latest Ref Rng & Units 09/13/2020 06/30/2020 06/30/2020  WBC 4.0 - 10.5 K/uL 8.2 15.0(H) 15.2(H)  Hemoglobin 13.0 - 17.0 g/dL 14.7 14.7 14.9  Hematocrit 39 - 52 % 44.4 45.2 43.2  Platelets 150 - 400 K/uL 261 282 265      CMP     Component Value Date/Time   NA 140 09/13/2020 1034   NA 144 11/19/2016 0955   NA 138 11/27/2013 0819   K 4.5 09/13/2020 1034   K 4.1 11/27/2013 0819   CL 105 09/13/2020 1034   CL 107 11/27/2013 0819   CO2 29 09/13/2020 1034   CO2 23 11/27/2013 0819   GLUCOSE 110 (H) 09/13/2020 1034   GLUCOSE 101 (H) 11/27/2013 0819   BUN 19 09/13/2020 1034   BUN 25 11/19/2016 0955   BUN 22 (H) 11/27/2013 0819   CREATININE 1.22 09/13/2020 1034   CREATININE 1.27 (H) 03/06/2020 1612   CALCIUM 9.1 09/13/2020 1034   CALCIUM 8.2 (L) 11/27/2013 0819   PROT 6.7 06/29/2020 1953   PROT 6.4 11/19/2016 0955   PROT 7.2 11/26/2013 0239   ALBUMIN 3.5 06/29/2020 1953   ALBUMIN 4.1 11/19/2016 0955   ALBUMIN 3.3 (L) 11/26/2013 0239   AST 20 06/29/2020 1953   AST 21 11/26/2013 0239   ALT 18 06/29/2020 1953   ALT 18 11/26/2013 0239   ALKPHOS 70 06/29/2020 1953   ALKPHOS 68 11/26/2013 0239   BILITOT 1.5 (H) 06/29/2020 1953   BILITOT 0.3 11/19/2016 0955   BILITOT 0.5 11/26/2013 0239   GFRNONAA 51 (L) 09/13/2020 1034   GFRNONAA 49 (L) 03/06/2020 1612   GFRAA 48 (L)  06/30/2020 1953   GFRAA 57 (L) 03/06/2020 1612     No results found.     Assessment & Plan:   1. Right leg numbness Based on noninvasive studies today the numbness is not related to decreased circulation.  Based upon the description of his symptoms it is possible this could be restless leg syndrome or neuropathy resulting from previous lower back issues.  Will defer further work-up to the patient's primary care physician.  2. Peripheral vascular disease of lower extremity (HCC)  Recommend:   The patient does not voice lifestyle limiting changes at this point in time.  Noninvasive studies do not suggest clinically significant change.  No invasive studies, angiography or surgery at this time The patient should continue walking and begin a more formal exercise program.  The patient should continue antiplatelet therapy and aggressive treatment of the lipid abnormalities  No changes in the patient's medications at this time  The patient should continue wearing graduated compression socks 10-15 mmHg strength to control the mild edema.    3. Mixed hyperlipidemia Continue statin as ordered and reviewed, no changes at this time   4. Essential hypertension Continue antihypertensive medications as already ordered, these medications have been reviewed and there are no changes at this time.    Current  Outpatient Medications on File Prior to Visit  Medication Sig Dispense Refill  . acetaminophen (TYLENOL) 325 MG tablet Take 2 tablets (650 mg total) by mouth every 4 (four) hours as needed for mild pain (or temp > 37.5 C (99.5 F)). (Patient taking differently: Take 650 mg by mouth as needed for mild pain (or temp > 37.5 C (99.5 F)). )    . apixaban (ELIQUIS) 2.5 MG TABS tablet Take 1 tablet (2.5 mg total) by mouth 2 (two) times daily. 60 tablet 0  . Cholecalciferol (VITAMIN D3) 50 MCG (2000 UT) TABS Take 2,000 Units by mouth daily. 30 tablet 0  . Ensure Max Protein (ENSURE MAX PROTEIN)  LIQD Take 330 mLs (11 oz total) by mouth 2 (two) times daily.    . finasteride (PROSCAR) 5 MG tablet Take 1 tablet (5 mg total) by mouth daily. 30 tablet 0  . methylPREDNISolone (MEDROL DOSEPAK) 4 MG TBPK tablet 6 day dose pack - take as directed 21 tablet 0  . metoprolol succinate (TOPROL XL) 25 MG 24 hr tablet Take 1 tablet (25 mg total) by mouth daily. 30 tablet 0  . Omega-3 Fatty Acids (FISH OIL) 1000 MG CAPS Take 1,000 mg by mouth daily.     . ondansetron (ZOFRAN) 4 MG tablet Take 1 tablet (4 mg total) by mouth every 8 (eight) hours as needed for nausea or vomiting. 20 tablet 0  . pantoprazole (PROTONIX) 40 MG tablet TAKE ONE TABLET EVERY DAY 90 tablet 0  . rosuvastatin (CRESTOR) 40 MG tablet Take 1 tablet (40 mg total) by mouth daily at 6 PM. 30 tablet 0  . ursodiol (ACTIGALL) 300 MG capsule Take 300 mg by mouth in the morning and at bedtime.     No current facility-administered medications on file prior to visit.    There are no Patient Instructions on file for this visit. No follow-ups on file.   Kris Hartmann, NP

## 2020-10-15 ENCOUNTER — Telehealth: Payer: Self-pay

## 2020-10-15 DIAGNOSIS — K8042 Calculus of bile duct with acute cholecystitis without obstruction: Secondary | ICD-10-CM

## 2020-10-15 NOTE — Telephone Encounter (Signed)
Per Dr. Allen Norris patient needs a repeat  ERCP.Last ERCP was in June. Please call daughter with the date

## 2020-10-16 ENCOUNTER — Ambulatory Visit: Payer: Medicare Other | Admitting: Physical Therapy

## 2020-10-16 ENCOUNTER — Other Ambulatory Visit: Payer: Self-pay

## 2020-10-16 ENCOUNTER — Encounter: Payer: Self-pay | Admitting: Physical Therapy

## 2020-10-16 ENCOUNTER — Telehealth: Payer: Self-pay | Admitting: Gastroenterology

## 2020-10-16 DIAGNOSIS — R2689 Other abnormalities of gait and mobility: Secondary | ICD-10-CM

## 2020-10-16 DIAGNOSIS — K8042 Calculus of bile duct with acute cholecystitis without obstruction: Secondary | ICD-10-CM

## 2020-10-16 DIAGNOSIS — M6281 Muscle weakness (generalized): Secondary | ICD-10-CM | POA: Diagnosis not present

## 2020-10-16 DIAGNOSIS — R29898 Other symptoms and signs involving the musculoskeletal system: Secondary | ICD-10-CM

## 2020-10-16 DIAGNOSIS — R2681 Unsteadiness on feet: Secondary | ICD-10-CM

## 2020-10-16 NOTE — Telephone Encounter (Signed)
Called Dr. Clayborn Bigness office and asked them to get blood thinner request back ASAP. Receptionist sent a note to nurse stating this

## 2020-10-16 NOTE — Telephone Encounter (Signed)
Called daughter and got patient scheduled for 10/23/2020 with Dr. Allen Norris for a ERCP. Went over instructions with daughter and informed daughter he had [facility]. She verbalized understanding and informed her that I would also send instructions to his mychart. Sent blood thinner request to cardiologist to get Eliquis clearance. Informed daughter I would call her as soon as I got that back.

## 2020-10-16 NOTE — Telephone Encounter (Signed)
I think December 14th is the first day we can get patient in to do a ERCP Or do you want me to see if they will let you do it next Tuesday since you are on call.

## 2020-10-16 NOTE — Telephone Encounter (Signed)
Dr. Clayborn Bigness states for patient to stop the Eliquis 5 days prior to procedure and restart Eliquis 3 days after procedure. Called and informed daughter and she verbalized understanding of instructions

## 2020-10-16 NOTE — Therapy (Signed)
[place] MAIN [facility] SERVICES [address] [place], [place], [address] Phone: [phone]   Fax:  [phone]  Physical Therapy Treatment  Patient Details  Name: Jose Castro MRN: [id number] Date of Birth: [birth year] Referring Provider (PT): Dr. Ancil Boozer   Encounter Date: 10/16/2020   PT End of Session - 10/16/20 1152    Visit Number 6    Number of Visits 25    Date for PT Re-Evaluation 12/13/20    Authorization Type eval: 09/20/20    PT Start Time 1146    PT Stop Time 1230    PT Time Calculation (min) 44 min    Equipment Utilized During Treatment Gait belt    Activity Tolerance Patient tolerated treatment well;Patient limited by fatigue;Patient limited by pain    Behavior During Therapy WFL for tasks assessed/performed           Past Medical History:  Diagnosis Date   Allergy    Penicillin   Arthritis    Basal cell carcinoma    BPH (benign prostatic hyperplasia)    Chronic kidney disease    had a kidney stone which per family was a cyst that was removed   Dysrhythmia    GERD (gastroesophageal reflux disease)    History of kidney stones    Hyperlipidemia    Hypertension    Left-sided low back pain with left-sided sciatica    Lung mass    Splenic vein thrombosis    Stroke ([place]) 05/22/2019   Thrombosis 12/2015   mural  area and no notation of heart attack    Past Surgical History:  Procedure Laterality Date   APPENDECTOMY     CATARACT EXTRACTION, BILATERAL     CYSTOSCOPY WITH INSERTION OF UROLIFT     ENDOSCOPIC RETROGRADE CHOLANGIOPANCREATOGRAPHY (ERCP) WITH PROPOFOL N/A 05/11/2020   Procedure: ENDOSCOPIC RETROGRADE CHOLANGIOPANCREATOGRAPHY (ERCP) WITH PROPOFOL;  Surgeon: [name], MD;  Location: [facility] ENDOSCOPY;  Service: Endoscopy;  Laterality: N/A;   EYE SURGERY  08/2018   IR EXCHANGE BILIARY DRAIN  05/04/2020   KIDNEY STONE SURGERY     KNEE SURGERY     PACEMAKER INSERTION N/A 07/13/2018    Procedure: INSERTION PACEMAKER-DUEL CHAMBER INITIAL IMPLANT;  Surgeon: Isaias Cowman, MD;  Location: [facility] ORS;  Service: Cardiovascular;  Laterality: N/A;   SPINE SURGERY      There were no vitals filed for this visit.   Subjective Assessment - 10/16/20 1150    Subjective Patient reports doing well. He reports doing some walking over the weekend when he was shopping. He also reports adherence with tband exercise. He reports they are supposed to remove the chole drain next week;    Pertinent History Pt referred for deconditioning and gait instability. He has had a decline since he last finished therapy. Pt had a hospital admission from 05/02/20 to 05/14/20 for acute cholecystitis and is now s/p cholecystostomy drain placement. Drain is still in place and pt is waiting to hear when they plan to remove it. Pt discharged to SNF and then returned home. In addition pt underwent radiation therapy for a RUL lung mass/cancer and had his last radiation treatment in September of 2021. He reports that he has continued to walk at home as his primary exercise. Pt was previously seen at this clinic for PT/OT due to weakness/imbalance s/p CVA. Prior history from 09/21/19: Pt is a 86 year old male who presents with imbalance and difficulty with gait following a right frontal CVA with left hemiparesis on 05/22/19.  He has completed inpatient and home health PT, and now presents for OP PT.  He was discharged from inpatient rehab at a supervision level of assistance, ambulating with a RW.  His family notes that he is impulsive and will try to ambulate without his RW at home.  Family is currently providing 24/7 supervision.    Currently in Pain? No/denies    Multiple Pain Sites No               Treatment: NuStep level 2,71minutes for BUE and BLE coordination and general strengthening. RPM >50 for cardiovascular training.  Standing in // Bars: Airex Pad: narrow BOS 30 sec eyes open, 10 sec eyes closed x 2  bouts. CGA for safety. Patient demonstrates ankle righting reactions throughout task.  airex pad/ 8" step modified tandem stance              Unsupported  BUE ball pass side/side x5 reps each foot on step;   Standing on airex: Alternate LE toe taps x15 reps each LE with 2-1 rail assist;   Standing on 1/2 bolster: -feet apart, heel/toe rock x15 reps with 2 rail assist -feet apart, BUE wand flexion x10 reps with min A for safety; -tandem stance with 2-0 rail assist 10 sec hold x3 reps each foot in front; Patient required min A for safety and cues for weight shift for optimal stance control;    With 2.5# Ankle Weights: Stepping over orange hurdle forward/backward x10 reps; Side stepping over orange hurdle x10 reps; Required 2-1 HHA on rail assist with cues for increased step length for better foot clearance;   Standing heel/toe raises x15 reps with 2-0 rail assist, min A for safety; Standing hip abduction x15 reps each LE with finger tip hold for better weight bearing in LE to increase LE strengthening;   Gait around gym on level surface x200 feet with RW with cues to increase erect posture, increase step length for better reciprocal gait pattern; Patient often walks with flexed posture with short shuffled step length;  Pt educated throughout session about proper posture and technique with exercises. Improved exercise technique, movement at target joints, use of target muscles after min to mod verbal, visual, tactile cues.                          PT Education - 10/16/20 1151    Education Details exercise technique, body mechanics, HEP    Person(s) Educated Patient    Methods Explanation;Verbal cues    Comprehension Verbalized understanding;Returned demonstration;Verbal cues required;Need further instruction            PT Short Term Goals - 09/20/20 1401      PT SHORT TERM GOAL #1   Title Pt will be independent with HEP in order to improve strength  and balance in order to decrease fall risk and improve function at home.    Time 6    Period Weeks    Status New    Target Date 11/01/20             PT Long Term Goals - 09/20/20 1401      PT LONG TERM GOAL #1   Title Pt will improve BERG by at least 3 points in order to demonstrate clinically significant improvement in balance.    Baseline 09/20/20: 32/56 (Previous discharge: 03/06/20: 46/56)    Time 12    Period Weeks    Status New    Target  Date 12/13/20      PT LONG TERM GOAL #2   Title Pt will decrease TUG to below 14 seconds/decrease in order to demonstrate decreased fall risk.    Baseline 09/20/20: 19.2s    Time 12    Period Weeks    Status New    Target Date 12/13/20      PT LONG TERM GOAL #3   Title Pt will decrease 5TSTS by at least 3 seconds in order to demonstrate clinically significant improvement in LE strength.    Baseline 09/20/20: 23.1s with minA+1 for strength and balance (Previous discharge 03/06/20: 16.1s)    Time 12    Period Weeks    Status New    Target Date 12/13/20      PT LONG TERM GOAL #4   Title Pt will increase his self-selected 10MWT to >0.74 m/s in order to demonstrate improvement in gait speed and improved community ambulation    Baseline 09/20/20: self-selected: 14.4s = 0.69 m/s with rollator (Previous discharge 03/06/20: self-selected: 13.6s = 0.74 m/s)    Time 12    Period Weeks    Status New    Target Date 12/13/20                 Plan - 10/16/20 1346    Clinical Impression Statement Patient motivated and participated well within session. He was instructed in advanced LE strengthening. Patient was able to exhibit better foot clearance when negotiating obstacles this session with less HHA. Patient was instructed in advanced balance exercise. He does have difficulty with narrow base of support on uneven surfaces requiring min A for safety. He also had difficulty with increased weight bearing on LLE due to chronic knee pain. Patient  would benefit from additional skilled PT Intervention to improve strength, balance and mobility;    Personal Factors and Comorbidities Age;Comorbidity 3+    Comorbidities HTN, CVA, cholecystitis with cholecystostomy drain    Examination-Activity Limitations Bend;Lift;Squat;Locomotion Level;Stairs;Stand    Examination-Participation Restrictions Community Activity;Driving;Laundry;Meal Prep;Shop    Stability/Clinical Decision Making Unstable/Unpredictable    Rehab Potential Fair    PT Frequency 2x / week    PT Duration 12 weeks    PT Treatment/Interventions ADLs/Self Care Home Management;Canalith Repostioning;Cryotherapy;Electrical Stimulation;Iontophoresis 4mg /ml Dexamethasone;Moist Heat;Traction;Ultrasound;DME Instruction;Gait training;Stair training;Therapeutic activities;Therapeutic exercise;Functional mobility training;Balance training;Neuromuscular re-education;Patient/family education;Manual techniques;Dry needling;Vestibular;Joint Manipulations;Spinal Manipulations;Aquatic Therapy    PT Next Visit Plan 2MWT    PT Home Exercise Plan 09/20/20: None currently. Previous episode access code: [email or web address]    Consulted and Agree with Plan of Care Patient           Patient will benefit from skilled therapeutic intervention in order to improve the following deficits and impairments:  Abnormal gait, Decreased balance, Decreased mobility, Difficulty walking, Decreased knowledge of precautions, Decreased safety awareness, Decreased activity tolerance, Decreased strength, Decreased endurance  Visit Diagnosis: Muscle weakness (generalized)  Unsteadiness on feet  Other abnormalities of gait and mobility  Decreased strength of lower extremity     Problem List Patient Active Problem List   Diagnosis Date Noted   Non-small cell carcinoma of lung, right ([place]) 09/07/2020   Mass of upper lobe of right lung 07/13/2020   Calculus of bile duct with cholecystitis without obstruction    Abnormal  findings on imaging of biliary tract    Acute cholecystitis 05/02/2020   Chronic venous insufficiency 12/05/2019   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    CKD (chronic kidney disease), stage II    New onset  atrial fibrillation (HCC)    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   Goals of care, counseling/discussion    Palliative care by specialist    DNR (do not resuscitate) discussion    History of CVA (cerebrovascular accident) 05/22/2019   Persistent proteinuria 07/30/2018   Mobitz type 2 second degree heart block 07/13/2018   Chronic kidney disease, stage III (moderate) ([place]) 06/15/2018   Leg pain 05/06/2018   Lymphedema 05/06/2018   Coagulopathy ([place]) 07/07/2017   Lumbar spondylosis 03/10/2017   Elevated uric acid in blood 11/20/2016   BPH (benign prostatic hyperplasia) 05/29/2016   Peripheral vascular disease of lower extremity ([place]) 03/03/2016   Gallstone 12/31/2015   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   Atherosclerosis of aorta ([place]) 11/22/2015   Carotid artery narrowing 11/22/2015   Diverticulosis of colon 11/22/2015   Decreased creatinine clearance 11/22/2015   Arthritis, degenerative 11/22/2015   Lactose intolerance 11/22/2015   Basal cell carcinoma 11/22/2015   Essential hypertension 06/21/2015   Hyperlipemia 06/21/2015   GERD (gastroesophageal reflux disease) 06/21/2015   Calculus of kidney 11/18/2013    Karishma Unrein PT, DPT 10/16/2020, 1:49 PM  [place] [facility] MAIN [facility] SERVICES [address] [place], [place], [address] Phone: [phone]   Fax:  [phone]  Name: Jose Castro MRN: [id number] Date of Birth: Apr 26, 1926

## 2020-10-16 NOTE — Telephone Encounter (Signed)
Next Tuesday is good.

## 2020-10-18 ENCOUNTER — Other Ambulatory Visit: Payer: Self-pay

## 2020-10-18 ENCOUNTER — Ambulatory Visit: Payer: Medicare Other | Admitting: Physical Therapy

## 2020-10-18 ENCOUNTER — Encounter: Payer: Self-pay | Admitting: Physical Therapy

## 2020-10-18 DIAGNOSIS — M6281 Muscle weakness (generalized): Secondary | ICD-10-CM

## 2020-10-18 DIAGNOSIS — R2689 Other abnormalities of gait and mobility: Secondary | ICD-10-CM

## 2020-10-18 DIAGNOSIS — R2681 Unsteadiness on feet: Secondary | ICD-10-CM

## 2020-10-18 NOTE — Therapy (Signed)
[place] MAIN [facility] SERVICES [address] [place], [place], [address] Phone: [phone]   Fax:  [phone]  Physical Therapy Treatment  Patient Details  Name: Jose Castro MRN: [id number] Date of Birth: 1926-05-10 Referring Provider (PT): Dr. Ancil Boozer   Encounter Date: 10/18/2020   PT End of Session - 10/18/20 1047    Visit Number 7    Number of Visits 25    Date for PT Re-Evaluation 12/13/20    Authorization Type eval: 09/20/20    PT Start Time 36    PT Stop Time 1145    PT Time Calculation (min) 43 min    Equipment Utilized During Treatment Gait belt    Activity Tolerance Patient tolerated treatment well;Patient limited by fatigue;Patient limited by pain    Behavior During Therapy WFL for tasks assessed/performed           Past Medical History:  Diagnosis Date   Allergy    Penicillin   Arthritis    Basal cell carcinoma    BPH (benign prostatic hyperplasia)    Chronic kidney disease    had a kidney stone which per family was a cyst that was removed   Dysrhythmia    GERD (gastroesophageal reflux disease)    History of kidney stones    Hyperlipidemia    Hypertension    Left-sided low back pain with left-sided sciatica    Lung mass    Splenic vein thrombosis    Stroke ([place]) 05/22/2019   Thrombosis 12/2015   mural  area and no notation of heart attack    Past Surgical History:  Procedure Laterality Date   APPENDECTOMY     CATARACT EXTRACTION, BILATERAL     CYSTOSCOPY WITH INSERTION OF UROLIFT     ENDOSCOPIC RETROGRADE CHOLANGIOPANCREATOGRAPHY (ERCP) WITH PROPOFOL N/A 05/11/2020   Procedure: ENDOSCOPIC RETROGRADE CHOLANGIOPANCREATOGRAPHY (ERCP) WITH PROPOFOL;  Surgeon: [name], MD;  Location: [facility] ENDOSCOPY;  Service: Endoscopy;  Laterality: N/A;   EYE SURGERY  08/2018   IR EXCHANGE BILIARY DRAIN  05/04/2020   KIDNEY STONE SURGERY     KNEE SURGERY     PACEMAKER INSERTION N/A 07/13/2018    Procedure: INSERTION PACEMAKER-DUEL CHAMBER INITIAL IMPLANT;  Surgeon: Isaias Cowman, MD;  Location: [facility] ORS;  Service: Cardiovascular;  Laterality: N/A;   SPINE SURGERY      There were no vitals filed for this visit.   Subjective Assessment - 10/18/20 1105    Subjective Patient reports doing well; Denies any new falls. Reports doing well with HEP;    Pertinent History Pt referred for deconditioning and gait instability. He has had a decline since he last finished therapy. Pt had a hospital admission from 05/02/20 to 05/14/20 for acute cholecystitis and is now s/p cholecystostomy drain placement. Drain is still in place and pt is waiting to hear when they plan to remove it. Pt discharged to SNF and then returned home. In addition pt underwent radiation therapy for a RUL lung mass/cancer and had his last radiation treatment in September of 2021. He reports that he has continued to walk at home as his primary exercise. Pt was previously seen at this clinic for PT/OT due to weakness/imbalance s/p CVA. Prior history from 09/21/19: Pt is a 86 year old male who presents with imbalance and difficulty with gait following a right frontal CVA with left hemiparesis on 05/22/19.  He has completed inpatient and home health PT, and now presents for OP PT.  He was discharged from inpatient rehab  at a supervision level of assistance, ambulating with a RW.  His family notes that he is impulsive and will try to ambulate without his RW at home.  Family is currently providing 24/7 supervision.    Currently in Pain? No/denies    Multiple Pain Sites No             TREATMENT: NMR: Standing on airex beam: -tandem stance with 2-0 rail assist, 10 sec hold x4 reps each foot in front; -side stepping on airex beam x2 laps each direction with 1 rail assist and min A for safety; Patient exhibits decreased weight shift to LLE which could be due to increased left knee discomfort and/or LLE weakness, leading to decreased  step length on RLE;   Standing on airex beam with feet apart:  BUE arm lift x10 reps BUE ball pass side/side x10 reps each  Patient required min VCs for balance stability, including to increase trunk control/weight shift for better stance control;     With 2.5# Ankle Weights: Stepping over orange hurdle forward/backward x10 reps; Side stepping over orange hurdle x10 reps; Required 2-1 HHA on rail assist with cues for increased step length for better foot clearance;  Forward/backward walking in parallel bars with 1 rail assist x10 feet x3 laps each  Side stepping in parallel bars with finger tip to 0 rail assist x10 feet x3 laps each direction;  Advanced HEP with walking for increased cardiovascular endurance and to increase LE strength;   Leg press: BLE 40# 2x10 with min A to get on/off equipment and min VCS for slow down LE movement for better motor control;   Gait around gym on level surface x200 feet with RW with cues to increase erect posture, increase step length for better reciprocal gait pattern; Patient often walks with flexed posture with short shuffled step length; Pt educated throughout session about proper posture and technique with exercises. Improved exercise technique, movement at target joints, use of target muscles after min to mod verbal, visual, tactile cues.                           PT Education - 10/18/20 1047    Education Details exercise technique, body mechanics, HEP    Person(s) Educated Patient    Methods Explanation;Verbal cues    Comprehension Verbalized understanding;Returned demonstration;Verbal cues required;Need further instruction            PT Short Term Goals - 09/20/20 1401      PT SHORT TERM GOAL #1   Title Pt will be independent with HEP in order to improve strength and balance in order to decrease fall risk and improve function at home.    Time 6    Period Weeks    Status New    Target Date 11/01/20              PT Long Term Goals - 09/20/20 1401      PT LONG TERM GOAL #1   Title Pt will improve BERG by at least 3 points in order to demonstrate clinically significant improvement in balance.    Baseline 09/20/20: 32/56 (Previous discharge: 03/06/20: 46/56)    Time 12    Period Weeks    Status New    Target Date 12/13/20      PT LONG TERM GOAL #2   Title Pt will decrease TUG to below 14 seconds/decrease in order to demonstrate decreased fall risk.    Baseline  09/20/20: 19.2s    Time 12    Period Weeks    Status New    Target Date 12/13/20      PT LONG TERM GOAL #3   Title Pt will decrease 5TSTS by at least 3 seconds in order to demonstrate clinically significant improvement in LE strength.    Baseline 09/20/20: 23.1s with minA+1 for strength and balance (Previous discharge 03/06/20: 16.1s)    Time 12    Period Weeks    Status New    Target Date 12/13/20      PT LONG TERM GOAL #4   Title Pt will increase his self-selected 10MWT to >0.74 m/s in order to demonstrate improvement in gait speed and improved community ambulation    Baseline 09/20/20: self-selected: 14.4s = 0.69 m/s with rollator (Previous discharge 03/06/20: self-selected: 13.6s = 0.74 m/s)    Time 12    Period Weeks    Status New    Target Date 12/13/20                 Plan - 10/18/20 1139    Clinical Impression Statement Patient motivated and participated well within session. He was instructed in advanced LE strengthening exercised with increased repetitions/standing. Patient was able to exhibit good stance control with reduced rail assist when on firm surface. Advanced HEP with walking tasks. Patient was instructed in advanced balance exercise utilizing airex beam. He does require min A especially with reduced rail assist. He does fatigue quickly requiring short seated rest break. He would benefit from additional skilled PT Intervention to improve strength, balance and gait safety;    Personal Factors and  Comorbidities Age;Comorbidity 3+    Comorbidities HTN, CVA, cholecystitis with cholecystostomy drain    Examination-Activity Limitations Bend;Lift;Squat;Locomotion Level;Stairs;Stand    Examination-Participation Restrictions Community Activity;Driving;Laundry;Meal Prep;Shop    Stability/Clinical Decision Making Unstable/Unpredictable    Rehab Potential Fair    PT Frequency 2x / week    PT Duration 12 weeks    PT Treatment/Interventions ADLs/Self Care Home Management;Canalith Repostioning;Cryotherapy;Electrical Stimulation;Iontophoresis 4mg /ml Dexamethasone;Moist Heat;Traction;Ultrasound;DME Instruction;Gait training;Stair training;Therapeutic activities;Therapeutic exercise;Functional mobility training;Balance training;Neuromuscular re-education;Patient/family education;Manual techniques;Dry needling;Vestibular;Joint Manipulations;Spinal Manipulations;Aquatic Therapy    PT Next Visit Plan 2MWT    PT Home Exercise Plan 09/20/20: None currently. Previous episode access code: [email or web address]    Consulted and Agree with Plan of Care Patient           Patient will benefit from skilled therapeutic intervention in order to improve the following deficits and impairments:  Abnormal gait, Decreased balance, Decreased mobility, Difficulty walking, Decreased knowledge of precautions, Decreased safety awareness, Decreased activity tolerance, Decreased strength, Decreased endurance  Visit Diagnosis: Muscle weakness (generalized)  Unsteadiness on feet  Other abnormalities of gait and mobility     Problem List Patient Active Problem List   Diagnosis Date Noted   Non-small cell carcinoma of lung, right ([place]) 09/07/2020   Mass of upper lobe of right lung 07/13/2020   Calculus of bile duct with cholecystitis without obstruction    Abnormal findings on imaging of biliary tract    Acute cholecystitis 05/02/2020   Chronic venous insufficiency 12/05/2019   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     CKD (chronic kidney disease), stage II    New onset atrial fibrillation (H[facility])    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   Goals of care, counseling/discussion    Palliative care by specialist    DNR (do not resuscitate) discussion    History of CVA (  cerebrovascular accident) 05/22/2019   Persistent proteinuria 07/30/2018   Mobitz type 2 second degree heart block 07/13/2018   Chronic kidney disease, stage III (moderate) ([place]) 06/15/2018   Leg pain 05/06/2018   Lymphedema 05/06/2018   Coagulopathy ([place]) 07/07/2017   Lumbar spondylosis 03/10/2017   Elevated uric acid in blood 11/20/2016   BPH (benign prostatic hyperplasia) 05/29/2016   Peripheral vascular disease of lower extremity ([place]) 03/03/2016   Gallstone 12/31/2015   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   Atherosclerosis of aorta ([place]) 11/22/2015   Carotid artery narrowing 11/22/2015   Diverticulosis of colon 11/22/2015   Decreased creatinine clearance 11/22/2015   Arthritis, degenerative 11/22/2015   Lactose intolerance 11/22/2015   Basal cell carcinoma 11/22/2015   Essential hypertension 06/21/2015   Hyperlipemia 06/21/2015   GERD (gastroesophageal reflux disease) 06/21/2015   Calculus of kidney 11/18/2013    Itzelle Gains PT, DPT 10/18/2020, 12:00 PM  [place] MAIN [facility] SERVICES [address] [place], [place], [address] Phone: [phone]   Fax:  [phone]  Name: DELTON STELLE MRN: [id number] Date of Birth: August 23, 1926

## 2020-10-18 NOTE — Patient Instructions (Signed)
Access Code: [facility] URL: [email or web address] Date: 10/18/2020 Prepared by: Blanche East  Exercises Side Stepping with Counter Support - 1 x daily - 7 x weekly - 1 sets - 3 reps Backward Walking with Counter Support - 1 x daily - 7 x weekly - 1 sets - 3 reps

## 2020-10-19 ENCOUNTER — Other Ambulatory Visit: Payer: Self-pay

## 2020-10-19 ENCOUNTER — Other Ambulatory Visit
Admission: RE | Admit: 2020-10-19 | Discharge: 2020-10-19 | Disposition: A | Discharge status: Home / Self Care | Payer: Medicare Other | Source: Ambulatory Visit | Attending: Gastroenterology | Admitting: Gastroenterology

## 2020-10-19 DIAGNOSIS — Z20822 Contact with and (suspected) exposure to covid-19: Secondary | ICD-10-CM | POA: Insufficient documentation

## 2020-10-19 DIAGNOSIS — Z01812 Encounter for preprocedural laboratory examination: Secondary | ICD-10-CM | POA: Diagnosis present

## 2020-10-20 LAB — SARS CORONAVIRUS 2 (TAT 6-24 HRS): SARS Coronavirus 2: NEGATIVE

## 2020-10-22 ENCOUNTER — Encounter: Payer: Self-pay | Admitting: Gastroenterology

## 2020-10-23 ENCOUNTER — Encounter: Payer: Self-pay | Admitting: Gastroenterology

## 2020-10-23 ENCOUNTER — Ambulatory Visit: Payer: Medicare Other | Admitting: Registered Nurse

## 2020-10-23 ENCOUNTER — Other Ambulatory Visit: Payer: Self-pay

## 2020-10-23 ENCOUNTER — Ambulatory Visit: Payer: Medicare Other

## 2020-10-23 ENCOUNTER — Ambulatory Visit: Payer: Medicare Other | Admitting: Physical Therapy

## 2020-10-23 ENCOUNTER — Ambulatory Visit
Admission: RE | Admit: 2020-10-23 | Discharge: 2020-10-23 | Disposition: A | Discharge status: Home / Self Care | Payer: Medicare Other | Attending: Gastroenterology | Admitting: Gastroenterology

## 2020-10-23 ENCOUNTER — Encounter
Admission: RE | Disposition: A | Discharge status: Home / Self Care | Payer: Self-pay | Source: Home / Self Care | Attending: Gastroenterology

## 2020-10-23 DIAGNOSIS — Z79899 Other long term (current) drug therapy: Secondary | ICD-10-CM | POA: Diagnosis not present

## 2020-10-23 DIAGNOSIS — Z880 Allergy status to penicillin: Secondary | ICD-10-CM | POA: Diagnosis not present

## 2020-10-23 DIAGNOSIS — R932 Abnormal findings on diagnostic imaging of liver and biliary tract: Secondary | ICD-10-CM | POA: Insufficient documentation

## 2020-10-23 DIAGNOSIS — Z8673 Personal history of transient ischemic attack (TIA), and cerebral infarction without residual deficits: Secondary | ICD-10-CM | POA: Insufficient documentation

## 2020-10-23 DIAGNOSIS — K8050 Calculus of bile duct without cholangitis or cholecystitis without obstruction: Secondary | ICD-10-CM | POA: Diagnosis not present

## 2020-10-23 DIAGNOSIS — Z7901 Long term (current) use of anticoagulants: Secondary | ICD-10-CM | POA: Diagnosis not present

## 2020-10-23 DIAGNOSIS — Z87891 Personal history of nicotine dependence: Secondary | ICD-10-CM | POA: Insufficient documentation

## 2020-10-23 DIAGNOSIS — Z950 Presence of cardiac pacemaker: Secondary | ICD-10-CM | POA: Insufficient documentation

## 2020-10-23 HISTORY — PX: ERCP: SHX5425

## 2020-10-23 SURGERY — ERCP, WITH INTERVENTION IF INDICATED
Anesthesia: General

## 2020-10-23 MED ORDER — PROPOFOL 10 MG/ML IV BOLUS
INTRAVENOUS | Status: AC
  Filled 2020-10-23: qty 20

## 2020-10-23 MED ORDER — GLYCOPYRROLATE 0.2 MG/ML IJ SOLN
INTRAMUSCULAR | Status: DC | PRN
  Administered 2020-10-23: .2 mg via INTRAVENOUS

## 2020-10-23 MED ORDER — PROPOFOL 10 MG/ML IV BOLUS
INTRAVENOUS | Status: DC | PRN
  Administered 2020-10-23: 50 mg via INTRAVENOUS

## 2020-10-23 MED ORDER — FENTANYL CITRATE (PF) 100 MCG/2ML IJ SOLN
INTRAMUSCULAR | Status: DC | PRN
Start: 2020-10-23 — End: 2020-10-23
  Administered 2020-10-23: 25 ug via INTRAVENOUS

## 2020-10-23 MED ORDER — PROPOFOL 500 MG/50ML IV EMUL
INTRAVENOUS | Status: DC | PRN
  Administered 2020-10-23: 100 ug/kg/min via INTRAVENOUS

## 2020-10-23 MED ORDER — LIDOCAINE HCL (CARDIAC) PF 100 MG/5ML IV SOSY
PREFILLED_SYRINGE | INTRAVENOUS | Status: DC | PRN
  Administered 2020-10-23: 80 mg via INTRAVENOUS

## 2020-10-23 MED ORDER — LACTATED RINGERS IV SOLN: INTRAVENOUS | Status: DC

## 2020-10-23 MED ORDER — GLYCOPYRROLATE 0.2 MG/ML IJ SOLN
INTRAMUSCULAR | Status: AC
  Filled 2020-10-23: qty 1

## 2020-10-23 MED ORDER — FENTANYL CITRATE (PF) 100 MCG/2ML IJ SOLN
INTRAMUSCULAR | Status: AC
  Filled 2020-10-23: qty 2

## 2020-10-23 NOTE — Op Note (Signed)
[facility] Gastroenterology Patient Name: Jose Castro Procedure Date: 10/23/2020 11:12 AM MRN: [id number] Account #: [id number] Date of Birth: 1926/02/19 Admit Type: Outpatient Age: 86 Room: [facility] ENDO ROOM 4 Gender: Male Note Status: Finalized Procedure:             ERCP Indications:           Common bile duct stone(s) Providers:             [name] MD, MD Medicines:             Propofol per Anesthesia Complications:         No immediate complications. Procedure:             Pre-Anesthesia Assessment:                        - Prior to the procedure, a History and Physical was                         performed, and patient medications and allergies were                         reviewed. The patient's tolerance of previous                         anesthesia was also reviewed. The risks and benefits                         of the procedure and the sedation options and risks                         were discussed with the patient. All questions were                         answered, and informed consent was obtained. Prior                         Anticoagulants: The patient has taken no previous                         anticoagulant or antiplatelet agents. ASA Grade                         Assessment: III - A patient with severe systemic                         disease. After reviewing the risks and benefits, the                         patient was deemed in satisfactory condition to                         undergo the procedure.                        After obtaining informed consent, the scope was passed                         under direct vision. Throughout the procedure, the  patient's blood pressure, pulse, and oxygen                         saturations were monitored continuously. The was                         introduced through the mouth, and used to inject                         contrast into and used to inject contrast into  the                         bile duct. The ERCP was accomplished without                         difficulty. The patient tolerated the procedure well. Findings:      A scout film of the abdomen was obtained. One percutaneous drain ending       in the Right upper quadrant was seen. The esophagus was successfully       intubated under direct vision. The scope was advanced to a normal major       papilla in the descending duodenum without detailed examination of the       pharynx, larynx and associated structures, and upper GI tract. The upper       GI tract was grossly normal. The bile duct was deeply cannulated with       the short-nosed traction sphincterotome. Contrast was injected. I       personally interpreted the bile duct images. There was brisk flow of       contrast through the ducts. Image quality was excellent. Contrast       extended to the entire biliary tree. The right intrahepatic branches       contained filling defect(s). A wire was passed into the biliary tree.       The biliary tree was swept with a 15 mm balloon starting at the right       intrahepatic duct(s). Sludge was swept from the duct. Impression:            - A filling defect was seen on the cholangiogram.                        - The biliary tree was swept and sludge was found. Recommendation:        - Discharge patient to home.                        - Resume previous diet.                        - Watch for pancreatitis, bleeding, perforation, and                         cholangitis. Procedure Code(s):     --- Professional ---                        [phone], Endoscopic retrograde cholangiopancreatography                         (ERCP); with removal of calculi/debris from  biliary/pancreatic duct(s)                        [id number], Endoscopic catheterization of the biliary                         ductal system, radiological supervision and                         interpretation Diagnosis  Code(s):     --- Professional ---                        K80.50, Calculus of bile duct without cholangitis or                         cholecystitis without obstruction                        R93.2, Abnormal findings on diagnostic imaging of                         liver and biliary tract CPT copyright 2019 American Medical Association. All rights reserved. The codes documented in this report are preliminary and upon coder review may  be revised to meet current compliance requirements. [name] MD, MD 10/23/2020 11:55:03 AM This report has been signed electronically. Number of Addenda: 0 Note Initiated On: 10/23/2020 11:12 AM Estimated Blood Loss:  Estimated blood loss: none.      [facility]

## 2020-10-23 NOTE — Transfer of Care (Signed)
Immediate Anesthesia Transfer of Care Note  Patient: Jose Castro  Procedure(s) Performed: ENDOSCOPIC RETROGRADE CHOLANGIOPANCREATOGRAPHY (ERCP) (N/A )  Patient Location: Endoscopy Unit  Anesthesia Type:General  Level of Consciousness: drowsy  Airway & Oxygen Therapy: Patient Spontanous Breathing  Post-op Assessment: Report given to RN and Post -op Vital signs reviewed and stable  Post vital signs: Reviewed and stable  Last Vitals:  Vitals Value Taken Time  BP 136/72 10/23/20 1152  Temp    Pulse 61 10/23/20 1153  Resp 15 10/23/20 1153  SpO2 95 % 10/23/20 1153  Vitals shown include unvalidated device data.  Last Pain:  Vitals:   10/23/20 1151  TempSrc:   PainSc: 0-No pain         Complications: No complications documented.

## 2020-10-23 NOTE — Anesthesia Postprocedure Evaluation (Signed)
Anesthesia Post Note  Patient: Jose Castro  Procedure(s) Performed: ENDOSCOPIC RETROGRADE CHOLANGIOPANCREATOGRAPHY (ERCP) (N/A )  Patient location during evaluation: Endoscopy Anesthesia Type: General Level of consciousness: awake and alert and oriented Pain management: pain level controlled Vital Signs Assessment: post-procedure vital signs reviewed and stable Respiratory status: spontaneous breathing, nonlabored ventilation and respiratory function stable Cardiovascular status: blood pressure returned to baseline and stable Postop Assessment: no signs of nausea or vomiting Anesthetic complications: no   No complications documented.   Last Vitals:  Vitals:   10/23/20 1231 10/23/20 1241  BP: (!) 143/75 138/74  Pulse: 60 (!) 59  Resp: 13 16  Temp:    SpO2: 97% 97%    Last Pain:  Vitals:   10/23/20 1231  TempSrc:   PainSc: 0-No pain                 Edra Riccardi

## 2020-10-23 NOTE — Anesthesia Preprocedure Evaluation (Signed)
Anesthesia Evaluation  Patient identified by MRN, date of birth, ID band Patient awake    Reviewed: Allergy & Precautions, NPO status , Patient's Chart, lab work & pertinent test results  History of Anesthesia Complications Negative for: history of anesthetic complications  Airway Mallampati: II  TM Distance: >3 FB Neck ROM: Full    Dental  (+) Poor Dentition   Pulmonary neg sleep apnea, neg COPD, former smoker,    breath sounds clear to auscultation- rhonchi (-) wheezing      Cardiovascular hypertension, Pt. on medications (-) CAD, (-) Past MI, (-) Cardiac Stents and (-) CABG  Rhythm:Regular Rate:Normal - Systolic murmurs and - Diastolic murmurs    Neuro/Psych neg Seizures CVA, No Residual Symptoms negative psych ROS   GI/Hepatic Neg liver ROS, GERD  ,  Endo/Other  negative endocrine ROSneg diabetes  Renal/GU Renal InsufficiencyRenal disease     Musculoskeletal  (+) Arthritis ,   Abdominal (+) - obese,   Peds  Hematology negative hematology ROS (+)   Anesthesia Other Findings Past Medical History: No date: Allergy     Comment:  Penicillin No date: Arthritis No date: Basal cell carcinoma No date: BPH (benign prostatic hyperplasia) No date: Chronic kidney disease     Comment:  had a kidney stone which per family was a cyst that was               removed No date: Dysrhythmia No date: GERD (gastroesophageal reflux disease) No date: History of kidney stones No date: Hyperlipidemia No date: Hypertension No date: Left-sided low back pain with left-sided sciatica No date: Lung mass No date: Splenic vein thrombosis 05/22/2019: Stroke ([place]) 12/2015: Thrombosis     Comment:  mural  area and no notation of heart attack   Reproductive/Obstetrics                             Anesthesia Physical Anesthesia Plan  ASA: III  Anesthesia Plan: General   Post-op Pain Management:    Induction:  Intravenous  PONV Risk Score and Plan: 1 and Propofol infusion  Airway Management Planned: Natural Airway  Additional Equipment:   Intra-op Plan:   Post-operative Plan:   Informed Consent: I have reviewed the patients History and Physical, chart, labs and discussed the procedure including the risks, benefits and alternatives for the proposed anesthesia with the patient or authorized representative who has indicated his/her understanding and acceptance.     Dental advisory given  Plan Discussed with: CRNA and Anesthesiologist  Anesthesia Plan Comments:         Anesthesia Quick Evaluation

## 2020-10-23 NOTE — H&P (Signed)
Jose Lame, MD [facility] [address], [place], [place] [address] Phone:[phone] Fax : [phone]  Primary Care Physician:  Steele Sizer, MD Primary Gastroenterologist:  Dr. Allen Norris  Pre-Procedure History & Physical: HPI:  Jose Castro is a 86 y.o. male is here for an ERCP.   Past Medical History:  Diagnosis Date  . Allergy    Penicillin  . Arthritis   . Basal cell carcinoma   . BPH (benign prostatic hyperplasia)   . Chronic kidney disease    had a kidney stone which per family was a cyst that was removed  . Dysrhythmia   . GERD (gastroesophageal reflux disease)   . History of kidney stones   . Hyperlipidemia   . Hypertension   . Left-sided low back pain with left-sided sciatica   . Lung mass   . Splenic vein thrombosis   . Stroke ([place]) 05/22/2019  . Thrombosis 12/2015   mural  area and no notation of heart attack    Past Surgical History:  Procedure Laterality Date  . APPENDECTOMY    . CATARACT EXTRACTION, BILATERAL    . CYSTOSCOPY WITH INSERTION OF UROLIFT    . ENDOSCOPIC RETROGRADE CHOLANGIOPANCREATOGRAPHY (ERCP) WITH PROPOFOL N/A 05/11/2020   Procedure: ENDOSCOPIC RETROGRADE CHOLANGIOPANCREATOGRAPHY (ERCP) WITH PROPOFOL;  Surgeon: Jose Lame, MD;  Location: [facility] ENDOSCOPY;  Service: Endoscopy;  Laterality: N/A;  . EYE SURGERY  08/2018  . IR EXCHANGE BILIARY DRAIN  05/04/2020  . KIDNEY STONE SURGERY    . KNEE SURGERY    . PACEMAKER INSERTION N/A 07/13/2018   Procedure: INSERTION PACEMAKER-DUEL CHAMBER INITIAL IMPLANT;  Surgeon: Isaias Cowman, MD;  Location: [facility] ORS;  Service: Cardiovascular;  Laterality: N/A;  . SPINE SURGERY      Prior to Admission medications   Medication Sig Start Date End Date Taking? Authorizing Provider  apixaban (ELIQUIS) 2.5 MG TABS tablet Take 1 tablet (2.5 mg total) by mouth 2 (two) times daily. 06/07/19  Yes Angiulli, Lavon Paganini, PA-C  Cholecalciferol (VITAMIN D3) 50 MCG (2000 UT) TABS Take 2,000 Units by mouth daily.  06/07/19  Yes Angiulli, Lavon Paganini, PA-C  Ensure Max Protein (ENSURE MAX PROTEIN) LIQD Take 330 mLs (11 oz total) by mouth 2 (two) times daily. 05/14/20  Yes Enzo Bi, MD  finasteride (PROSCAR) 5 MG tablet Take 1 tablet (5 mg total) by mouth daily. 06/07/19  Yes Angiulli, Lavon Paganini, PA-C  methylPREDNISolone (MEDROL DOSEPAK) 4 MG TBPK tablet 6 day dose pack - take as directed 10/01/20  Yes McDonald, Stephan Minister, DPM  metoprolol succinate (TOPROL XL) 25 MG 24 hr tablet Take 1 tablet (25 mg total) by mouth daily. 06/07/19  Yes Angiulli, Lavon Paganini, PA-C  Omega-3 Fatty Acids (FISH OIL) 1000 MG CAPS Take 1,000 mg by mouth daily.    Yes [provider]  ondansetron (ZOFRAN) 4 MG tablet Take 1 tablet (4 mg total) by mouth every 8 (eight) hours as needed for nausea or vomiting. 05/02/20  Yes Sowles, Drue Stager, MD  pantoprazole (PROTONIX) 40 MG tablet TAKE ONE TABLET EVERY DAY 07/30/20  Yes Sowles, Drue Stager, MD  rosuvastatin (CRESTOR) 40 MG tablet Take 1 tablet (40 mg total) by mouth daily at 6 PM. 06/07/19  Yes Angiulli, Lavon Paganini, PA-C  ursodiol (ACTIGALL) 300 MG capsule Take 300 mg by mouth in the morning and at bedtime. 07/13/20 07/13/21 Yes [provider]  acetaminophen (TYLENOL) 325 MG tablet Take 2 tablets (650 mg total) by mouth every 4 (four) hours as needed for mild  pain (or temp > 37.5 C (99.5 F)). Patient taking differently: Take 650 mg by mouth as needed for mild pain (or temp > 37.5 C (99.5 F)).  06/07/19   Angiulli, Lavon Paganini, PA-C    Allergies as of 10/16/2020 - Review Complete 10/16/2020  Allergen Reaction Noted  . Penicillins Itching and Swelling 10/22/2015    Family History  Problem Relation Age of Onset  . Cancer Brother        bladder cancer with mets  . Heart disease Mother   . Aortic aneurysm Mother   . Heart attack Maternal Aunt   . Heart attack Maternal Uncle     Social History   Socioeconomic History  . Marital status: Widowed    Spouse name: Not on file  . Number of children:  6  . Years of education: Not on file  . Highest education level: Some college, no degree  Occupational History  . 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 Smoking status: Former Smoker    Packs/day: 0.00    Years: 16.00    Pack years: 0.00    Types: Cigarettes    Start date: 10/21/1944    Quit date: 10/21/1960    Years since quitting: 60.0  . Smokeless tobacco: Never Used  Vaping Use  . Vaping Use: Never used  Substance and Sexual Activity  . Alcohol use: Yes    Alcohol/week: 3.0 standard drinks    Types: 3 Standard drinks or equivalent per week    Comment: once a week when he goes out to dinner-socially  . Drug use: No  . Sexual activity: Yes    Partners: Female  Other Topics Concern  . Not on file  Social History Narrative   Widow, lives alone but 4 of his children rotate staying with him since July 2020 due to stroke and he is not left alone but does most things independently.       Smoke 15-20 years; quit 60 years ago. Rare alcohol. 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    Social Determinants of Health   Financial Resource Strain: Low Risk   . Difficulty of Paying Living Expenses: Not hard at all  Food Insecurity: No Food Insecurity  . Worried About Charity fundraiser in the Last Year: Never true  . Ran Out of Food in the Last Year: Never true  Transportation Needs: No Transportation Needs  . Lack of Transportation (Medical): No  . Lack of Transportation (Non-Medical): No  Physical Activity: Inactive  . Days of Exercise per Week: 0 days  . Minutes of Exercise per Session: 0 min  Stress: No Stress Concern Present  . Feeling of Stress : Not at all  Social Connections: Moderately Integrated  . Frequency of Communication with Friends and Family: More than three times a week  . Frequency of Social Gatherings with Friends and Family: Twice a week  . Attends Religious Services: More than 4 times per year  . Active Member of Clubs or  Organizations: Yes  . Attends Archivist Meetings: More than 4 times per year  . Marital Status: Widowed  Intimate Partner Violence: Not At Risk  . Fear of Current or Ex-Partner: No  . Emotionally Abused: No  . Physically Abused: No  . Sexually Abused: No    Review of Systems: See HPI, otherwise negative ROS  Physical Exam: BP 114/70   Pulse 62   Temp 97.8 F (  36.6 C) (Tympanic)   Resp 18   Ht 5\' 10"  (1.778 m)   Wt 72.6 kg   BMI 22.96 kg/m  General:   Alert,  pleasant and cooperative in NAD Head:  Normocephalic and atraumatic. Neck:  Supple; no masses or thyromegaly. Lungs:  Clear throughout to auscultation.    Heart:  Regular rate and rhythm. Abdomen:  Soft, nontender and nondistended. Normal bowel sounds, without guarding, and without rebound.   Neurologic:  Alert and  oriented x4;  grossly normal neurologically.  Impression/Plan: NADIM MALIA is here for an ERCP to be performed for CBD stone  Risks, benefits, limitations, and alternatives regarding  ERCP have been reviewed with the patient.  Questions have been answered.  All parties agreeable.   Jose Lame, MD  10/23/2020, 11:12 AM

## 2020-10-29 ENCOUNTER — Encounter: Payer: Self-pay | Admitting: Gastroenterology

## 2020-10-30 ENCOUNTER — Ambulatory Visit: Payer: Medicare Other | Admitting: Physical Therapy

## 2020-10-31 ENCOUNTER — Other Ambulatory Visit: Payer: Self-pay | Admitting: Family Medicine

## 2020-11-01 ENCOUNTER — Other Ambulatory Visit: Payer: Self-pay

## 2020-11-01 ENCOUNTER — Ambulatory Visit: Payer: Medicare Other | Attending: Family Medicine | Admitting: Physical Therapy

## 2020-11-01 ENCOUNTER — Encounter: Payer: Self-pay | Admitting: Physical Therapy

## 2020-11-01 DIAGNOSIS — R5383 Other fatigue: Secondary | ICD-10-CM | POA: Insufficient documentation

## 2020-11-01 DIAGNOSIS — I63511 Cerebral infarction due to unspecified occlusion or stenosis of right middle cerebral artery: Secondary | ICD-10-CM | POA: Insufficient documentation

## 2020-11-01 DIAGNOSIS — R278 Other lack of coordination: Secondary | ICD-10-CM | POA: Diagnosis present

## 2020-11-01 DIAGNOSIS — M79674 Pain in right toe(s): Secondary | ICD-10-CM | POA: Insufficient documentation

## 2020-11-01 DIAGNOSIS — R2689 Other abnormalities of gait and mobility: Secondary | ICD-10-CM | POA: Insufficient documentation

## 2020-11-01 DIAGNOSIS — R2681 Unsteadiness on feet: Secondary | ICD-10-CM | POA: Diagnosis present

## 2020-11-01 DIAGNOSIS — M6281 Muscle weakness (generalized): Secondary | ICD-10-CM | POA: Insufficient documentation

## 2020-11-01 DIAGNOSIS — R29898 Other symptoms and signs involving the musculoskeletal system: Secondary | ICD-10-CM | POA: Insufficient documentation

## 2020-11-01 NOTE — Therapy (Signed)
[place] MAIN [facility] SERVICES [address] [place], [place], [address] Phone: [phone]   Fax:  [phone]  Physical Therapy Treatment  Patient Details  Name: Jose Castro MRN: [id number] Date of Birth: 10-Feb-1926 Referring Provider (PT): Dr. Ancil Boozer   Encounter Date: 11/01/2020   PT End of Session - 11/01/20 1109    Visit Number 8    Number of Visits 25    Date for PT Re-Evaluation 12/13/20    Authorization Type eval: 09/20/20    PT Start Time 1102    PT Stop Time 1145    PT Time Calculation (min) 43 min    Equipment Utilized During Treatment Gait belt    Activity Tolerance Patient tolerated treatment well;Patient limited by fatigue;Patient limited by pain    Behavior During Therapy [facility] for tasks assessed/performed           Past Medical History:  Diagnosis Date  . Allergy    Penicillin  . Arthritis   . Basal cell carcinoma   . BPH (benign prostatic hyperplasia)   . Chronic kidney disease    had a kidney stone which per family was a cyst that was removed  . Dysrhythmia   . GERD (gastroesophageal reflux disease)   . History of kidney stones   . Hyperlipidemia   . Hypertension   . Left-sided low back pain with left-sided sciatica   . Lung mass   . Splenic vein thrombosis   . Stroke ([place]) 05/22/2019  . Thrombosis 12/2015   mural  area and no notation of heart attack    Past Surgical History:  Procedure Laterality Date  . APPENDECTOMY    . CATARACT EXTRACTION, BILATERAL    . CYSTOSCOPY WITH INSERTION OF UROLIFT    . ENDOSCOPIC RETROGRADE CHOLANGIOPANCREATOGRAPHY (ERCP) WITH PROPOFOL N/A 05/11/2020   Procedure: ENDOSCOPIC RETROGRADE CHOLANGIOPANCREATOGRAPHY (ERCP) WITH PROPOFOL;  Surgeon: [name], MD;  Location: [facility] ENDOSCOPY;  Service: Endoscopy;  Laterality: N/A;  . ERCP N/A 10/23/2020   Procedure: ENDOSCOPIC RETROGRADE CHOLANGIOPANCREATOGRAPHY (ERCP);  Surgeon: [name], MD;  Location: [facility] ENDOSCOPY;   Service: Endoscopy;  Laterality: N/A;  . EYE SURGERY  08/2018  . IR EXCHANGE BILIARY DRAIN  05/04/2020  . KIDNEY STONE SURGERY    . KNEE SURGERY    . PACEMAKER INSERTION N/A 07/13/2018   Procedure: INSERTION PACEMAKER-DUEL CHAMBER INITIAL IMPLANT;  Surgeon: Isaias Cowman, MD;  Location: [facility] ORS;  Service: Cardiovascular;  Laterality: N/A;  . SPINE SURGERY      There were no vitals filed for this visit.   Subjective Assessment - 11/01/20 1107    Subjective Patient reports having a lot of foot/ankle pain and leg shakes earlier in the week. He reports its better currently but he still has the heel pain. He is s/p ERCP endoscopy on 11/23 with good tolerance; He still has the drain in abdomen and is waiting on MD to call back for an appointment;    Patient is accompained by: Family member    Pertinent History Pt referred for deconditioning and gait instability. He has had a decline since he last finished therapy. Pt had a hospital admission from 05/02/20 to 05/14/20 for acute cholecystitis and is now s/p cholecystostomy drain placement. Drain is still in place and pt is waiting to hear when they plan to remove it. Pt discharged to SNF and then returned home. In addition pt underwent radiation therapy for a RUL lung mass/cancer and had his last radiation treatment in September of  2021. He reports that he has continued to walk at home as his primary exercise. Pt was previously seen at this clinic for PT/OT due to weakness/imbalance s/p CVA. Prior history from 09/21/19: Pt is a 86 year old male who presents with imbalance and difficulty with gait following a right frontal CVA with left hemiparesis on 05/22/19.  He has completed inpatient and home health PT, and now presents for OP PT.  He was discharged from inpatient rehab at a supervision level of assistance, ambulating with a RW.  His family notes that he is impulsive and will try to ambulate without his RW at home.  Family is currently providing 24/7  supervision.    Limitations House hold activities;Walking    How long can you sit comfortably? no limitations    How long can you walk comfortably? he requires a RW    Patient Stated Goals Pt wants to be able to walk his granddaughter down the aisle at her wedding November 2022    Currently in Pain? Yes    Pain Score 4     Pain Location Heel    Pain Orientation Left    Pain Descriptors / Indicators Aching;Tender;Sore    Pain Type Chronic pain    Pain Onset More than a month ago    Pain Frequency Intermittent    Aggravating Factors  worse with palpation to achilles heel/tender    Pain Relieving Factors rest    Effect of Pain on Daily Activities decreased activity tolerance;    Multiple Pain Sites No               TREATMENT: Vitals at start of session, SPO2 100%, HR 74, BP 118/67 Warm up on Nustep BUE/BLE level 3 with cues to increase steps per minute >60 for cardiovascular challenge;   NMR: Standing on airex beam: -tandem stance with 2-0 rail assist, 10 sec hold x4 reps each foot in front; -side stepping on airex beam x3 laps each direction with 1 rail assist and min A for safety; -standing feet apart: throwing dart balls to dart board #10 with min A for safety, unsupported; Does exhibit posterior loss of balance with unsupported standing requiring min A for safety;  Patient exhibits decreased weight shift to LLE which could be due to increased left knee discomfort and/or LLE weakness, leading to decreased step length on RLE;     With 3# Ankle Weights: Stepping over orange hurdle forward/backward x10 reps; Side stepping over orange hurdle x10 reps; Required 2-1HHA on rail assist with cues for increased step length for better foot clearance;  Standing with 3# ankle weight: -hip flexion march x15 reps each LE with 1 rail assist -standing hip extension SLR x15 reps each LE with 2 rail assist and mod VCs for erect posture and increased AROM for better hip strengthening;     Leg press: BLE 40# 2x10 with min A to get on/off equipment and min VCS for slow down LE movement for better motor control;   Gait around gym on level surface x100 feet with RW with cues to increase erect posture, increase step length for better reciprocal gait pattern; Patient often walks with flexed posture with short shuffled step length; Pt educated throughout session about proper posture and technique with exercises. Improved exercise technique, movement at target joints, use of target muscles after min to mod verbal, visual, tactile cues.  PT Education - 11/01/20 1109    Education Details exercise technique, body mechanics, HEP    Person(s) Educated Patient    Methods Explanation;Verbal cues    Comprehension Verbalized understanding;Returned demonstration;Verbal cues required;Need further instruction            PT Short Term Goals - 09/20/20 1401      PT SHORT TERM GOAL #1   Title Pt will be independent with HEP in order to improve strength and balance in order to decrease fall risk and improve function at home.    Time 6    Period Weeks    Status New    Target Date 11/01/20             PT Long Term Goals - 09/20/20 1401      PT LONG TERM GOAL #1   Title Pt will improve BERG by at least 3 points in order to demonstrate clinically significant improvement in balance.    Baseline 09/20/20: 32/56 (Previous discharge: 03/06/20: 46/56)    Time 12    Period Weeks    Status New    Target Date 12/13/20      PT LONG TERM GOAL #2   Title Pt will decrease TUG to below 14 seconds/decrease in order to demonstrate decreased fall risk.    Baseline 09/20/20: 19.2s    Time 12    Period Weeks    Status New    Target Date 12/13/20      PT LONG TERM GOAL #3   Title Pt will decrease 5TSTS by at least 3 seconds in order to demonstrate clinically significant improvement in LE strength.    Baseline 09/20/20: 23.1s with minA+1 for strength  and balance (Previous discharge 03/06/20: 16.1s)    Time 12    Period Weeks    Status New    Target Date 12/13/20      PT LONG TERM GOAL #4   Title Pt will increase his self-selected 10MWT to >0.74 m/s in order to demonstrate improvement in gait speed and improved community ambulation    Baseline 09/20/20: self-selected: 14.4s = 0.69 m/s with rollator (Previous discharge 03/06/20: self-selected: 13.6s = 0.74 m/s)    Time 12    Period Weeks    Status New    Target Date 12/13/20                 Plan - 11/01/20 1120    Clinical Impression Statement Patient motivated and participated well within session. He was instructed in advanced LE strengthening exercise, progressing resistance and repetition for better LE strengthening. He does have difficulty with increased weight bearing on LLE due to knee/heel discomfort requiring rail assist for safety; Patient did appear more lethargic today and quieter. He was able to tolerate advanced exercise well with increased resistance and reducing rail assist working on balance activities.He does require min A for safety with most standing tasks for balance control with increased posterior loss of balance. Patient would benefit from additional skilled PT Intervention to improve strength, balance and mobility;    Personal Factors and Comorbidities Age;Comorbidity 3+    Comorbidities HTN, CVA, cholecystitis with cholecystostomy drain    Examination-Activity Limitations Bend;Lift;Squat;Locomotion Level;Stairs;Stand    Examination-Participation Restrictions Community Activity;Driving;Laundry;Meal Prep;Shop    Stability/Clinical Decision Making Unstable/Unpredictable    Rehab Potential Fair    PT Frequency 2x / week    PT Duration 12 weeks    PT Treatment/Interventions ADLs/Self Care Home Management;Canalith Repostioning;Cryotherapy;Electrical Stimulation;Iontophoresis 4mg /ml Dexamethasone;Moist Heat;Traction;Ultrasound;DME Instruction;Gait  training;Stair  training;Therapeutic activities;Therapeutic exercise;Functional mobility training;Balance training;Neuromuscular re-education;Patient/family education;Manual techniques;Dry needling;Vestibular;Joint Manipulations;Spinal Manipulations;Aquatic Therapy    PT Next Visit Plan 2MWT    PT Home Exercise Plan 09/20/20: None currently. Previous episode access code: [email or web address]    Consulted and Agree with Plan of Care Patient           Patient will benefit from skilled therapeutic intervention in order to improve the following deficits and impairments:  Abnormal gait, Decreased balance, Decreased mobility, Difficulty walking, Decreased knowledge of precautions, Decreased safety awareness, Decreased activity tolerance, Decreased strength, Decreased endurance  Visit Diagnosis: Muscle weakness (generalized)  Unsteadiness on feet  Other abnormalities of gait and mobility     Problem List Patient Active Problem List   Diagnosis Date Noted  . Calculus of bile duct without cholecystitis and without obstruction   . Non-small cell carcinoma of lung, right ([place]) 09/07/2020  . Mass of upper lobe of right lung 07/13/2020  . Calculus of bile duct with cholecystitis without obstruction   . Abnormal findings on imaging of biliary tract   . Acute cholecystitis 05/02/2020  . Chronic venous insufficiency 12/05/2019  . 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 CKD (chronic kidney disease), stage II   . New onset atrial fibrillation ([place])   . 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 Goals of care, counseling/discussion   . Palliative care by specialist   . DNR (do not resuscitate) discussion   . History of CVA (cerebrovascular accident) 05/22/2019  . Persistent proteinuria 07/30/2018  . 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 Chronic kidney disease, stage III (moderate) ([place]) 06/15/2018  . Leg pain 05/06/2018  . Lymphedema 05/06/2018  . Coagulopathy ([place]) 07/07/2017  .  Lumbar spondylosis 03/10/2017  . Elevated uric acid in blood 11/20/2016  . BPH (benign prostatic hyperplasia) 05/29/2016  . Peripheral vascular disease of lower extremity ([place]) 03/03/2016  . Gallstone 12/31/2015  . 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 Atherosclerosis of aorta ([place]) 11/22/2015  . Carotid artery narrowing 11/22/2015  . Diverticulosis of colon 11/22/2015  . Decreased creatinine clearance 11/22/2015  . Arthritis, degenerative 11/22/2015  . Lactose intolerance 11/22/2015  . Basal cell carcinoma 11/22/2015  . Essential hypertension 06/21/2015  . Hyperlipemia 06/21/2015  . GERD (gastroesophageal reflux disease) 06/21/2015  . Calculus of kidney 11/18/2013    Jorian Willhoite PT, DPT 11/01/2020, 12:47 PM  [place] MAIN [facility] SERVICES [address] [place], [place], [address] Phone: [phone]   Fax:  [phone]  Name: Jose [place] MRN: [id number] Date of Birth: 11-18-1926

## 2020-11-06 ENCOUNTER — Ambulatory Visit: Payer: Medicare Other

## 2020-11-06 ENCOUNTER — Encounter: Payer: Self-pay | Admitting: Physical Therapy

## 2020-11-06 ENCOUNTER — Other Ambulatory Visit: Payer: Self-pay

## 2020-11-06 DIAGNOSIS — R5383 Other fatigue: Secondary | ICD-10-CM

## 2020-11-06 DIAGNOSIS — M6281 Muscle weakness (generalized): Secondary | ICD-10-CM | POA: Diagnosis not present

## 2020-11-06 DIAGNOSIS — M79674 Pain in right toe(s): Secondary | ICD-10-CM

## 2020-11-06 DIAGNOSIS — I63511 Cerebral infarction due to unspecified occlusion or stenosis of right middle cerebral artery: Secondary | ICD-10-CM

## 2020-11-06 DIAGNOSIS — R278 Other lack of coordination: Secondary | ICD-10-CM

## 2020-11-06 DIAGNOSIS — R29898 Other symptoms and signs involving the musculoskeletal system: Secondary | ICD-10-CM

## 2020-11-06 DIAGNOSIS — R2681 Unsteadiness on feet: Secondary | ICD-10-CM

## 2020-11-06 DIAGNOSIS — R2689 Other abnormalities of gait and mobility: Secondary | ICD-10-CM

## 2020-11-06 NOTE — Therapy (Signed)
[place] MAIN [facility] SERVICES [address] [place], [place], [address] Phone: [phone]   Fax:  [phone]  Physical Therapy Treatment  Patient Details  Name: Jose Castro MRN: [id number] Date of Birth: 04-10-1926 Referring Provider (PT): Dr. Ancil Boozer   Encounter Date: 11/06/2020   PT End of Session - 11/06/20 1102    Visit Number 9    Number of Visits 25    Date for PT Re-Evaluation 12/13/20    Authorization Type eval: 09/20/20    PT Start Time 1103    PT Stop Time 1150    PT Time Calculation (min) 47 min    Equipment Utilized During Treatment Gait belt    Activity Tolerance Patient tolerated treatment well;Patient limited by fatigue;Patient limited by pain    Behavior During Therapy [facility] for tasks assessed/performed           Past Medical History:  Diagnosis Date  . Allergy    Penicillin  . Arthritis   . Basal cell carcinoma   . BPH (benign prostatic hyperplasia)   . Chronic kidney disease    had a kidney stone which per family was a cyst that was removed  . Dysrhythmia   . GERD (gastroesophageal reflux disease)   . History of kidney stones   . Hyperlipidemia   . Hypertension   . Left-sided low back pain with left-sided sciatica   . Lung mass   . Splenic vein thrombosis   . Stroke ([place]) 05/22/2019  . Thrombosis 12/2015   mural  area and no notation of heart attack    Past Surgical History:  Procedure Laterality Date  . APPENDECTOMY    . CATARACT EXTRACTION, BILATERAL    . CYSTOSCOPY WITH INSERTION OF UROLIFT    . ENDOSCOPIC RETROGRADE CHOLANGIOPANCREATOGRAPHY (ERCP) WITH PROPOFOL N/A 05/11/2020   Procedure: ENDOSCOPIC RETROGRADE CHOLANGIOPANCREATOGRAPHY (ERCP) WITH PROPOFOL;  Surgeon: [name], MD;  Location: [facility] ENDOSCOPY;  Service: Endoscopy;  Laterality: N/A;  . ERCP N/A 10/23/2020   Procedure: ENDOSCOPIC RETROGRADE CHOLANGIOPANCREATOGRAPHY (ERCP);  Surgeon: [name], MD;  Location: [facility] ENDOSCOPY;   Service: Endoscopy;  Laterality: N/A;  . EYE SURGERY  08/2018  . IR EXCHANGE BILIARY DRAIN  05/04/2020  . KIDNEY STONE SURGERY    . KNEE SURGERY    . PACEMAKER INSERTION N/A 07/13/2018   Procedure: INSERTION PACEMAKER-DUEL CHAMBER INITIAL IMPLANT;  Surgeon: Isaias Cowman, MD;  Location: [facility] ORS;  Service: Cardiovascular;  Laterality: N/A;  . SPINE SURGERY      There were no vitals filed for this visit.   Subjective Assessment - 11/06/20 1111    Subjective Patient reported that he is doing well, only ever has complaints of pain in his L knee. No falls or stumbles.    Patient is accompained by: Family member    Pertinent History Pt referred for deconditioning and gait instability. He has had a decline since he last finished therapy. Pt had a hospital admission from 05/02/20 to 05/14/20 for acute cholecystitis and is now s/p cholecystostomy drain placement. Drain is still in place and pt is waiting to hear when they plan to remove it. Pt discharged to SNF and then returned home. In addition pt underwent radiation therapy for a RUL lung mass/cancer and had his last radiation treatment in September of 2021. He reports that he has continued to walk at home as his primary exercise. Pt was previously seen at this clinic for PT/OT due to weakness/imbalance s/p CVA. Prior history from 09/21/19: Pt  is a 86 year old male who presents with imbalance and difficulty with gait following a right frontal CVA with left hemiparesis on 05/22/19.  He has completed inpatient and home health PT, and now presents for OP PT.  He was discharged from inpatient rehab at a supervision level of assistance, ambulating with a RW.  His family notes that he is impulsive and will try to ambulate without his RW at home.  Family is currently providing 24/7 supervision.    Limitations House hold activities;Walking    How long can you sit comfortably? no limitations    How long can you walk comfortably? he requires a RW    Patient  Stated Goals Pt wants to be able to walk his granddaughter down the aisle at her wedding November 2022    Currently in Pain? Yes    Pain Score 4     Pain Location Knee    Pain Orientation Left    Pain Descriptors / Indicators Aching;Sore    Pain Onset More than a month ago             TREATMENT:   Vitals at start of session, SPO2 100%, HR 71, BP 134/68   Warm up on Nustep BUE/BLE level 3 with cues to increase steps per minute >60 for cardiovascular challenge;  x5 mins with conversation to continue to challenge pt with multi tasking   NMR:   Standing on airex beam:   -tandem stance with 2-0 rail assist, 10 sec hold x4 reps each foot in front;   Forwards/backwards tandem walking with one rail support x4 lengths CGA  -side stepping on airex beam x4 laps each direction with 1 rail assist and min A/CGA  for safety;   -balloon taps unsupported 3 x 71m in ea (feet together, modified tandem with ea foot in front)  Patient exhibits decreased weight shift to LLE which could be due to increased left knee discomfort and/or LLE weakness, leading to decreased step length on RLE;  As well as more instances of instability with minA to correct with decreased base of support   With 3# Ankle Weights:   Stepping over orange hurdle forward/backward x10 reps;   Side stepping over orange hurdle x10 reps;   Required 1 HHA on rail assist with cues for increased step length for better foot clearance;    Therex:  Standing with 3# ankle weight:   -hip flexion march x15 reps each LE with 1 rail assist   -standing hip extension SLR x15 reps each LE with 2 rail assist and mod VCs for erect posture and increased AROM for better hip strengthening;    Leg press:   BLE 40# 2x10 with min A to get on/off equipment and min VCS for slow down LE movement for better motor control;  Occasional assist to reposition LLE on leg press  Pt educated throughout session about proper posture and technique with  exercises. Improved exercise technique, movement at target joints, use of target muscles after min to mod verbal, visual, tactile cues.    pt response/clinical impression: The patient demonstrated great motivation during session today. Does intermittently need cueing to re-direct to task. Few seated rest breaks utilized to address fatigue noted with exercises but able to complete all interventions as planned. The patient may benefit from an increase to 4-5lb ankle weights next session, and pt verbalized his eagerness to attempt. The patient would benefit from further skilled PT intervention to continue to progress towards goals.  PT Education - 11/06/20 1102    Education Details exercise tehcnique/body mechanics    Person(s) Educated Patient    Methods Explanation;Verbal cues;Tactile cues    Comprehension Verbalized understanding;Returned demonstration;Verbal cues required;Tactile cues required;Need further instruction            PT Short Term Goals - 09/20/20 1401      PT SHORT TERM GOAL #1   Title Pt will be independent with HEP in order to improve strength and balance in order to decrease fall risk and improve function at home.    Time 6    Period Weeks    Status New    Target Date 11/01/20             PT Long Term Goals - 09/20/20 1401      PT LONG TERM GOAL #1   Title Pt will improve BERG by at least 3 points in order to demonstrate clinically significant improvement in balance.    Baseline 09/20/20: 32/56 (Previous discharge: 03/06/20: 46/56)    Time 12    Period Weeks    Status New    Target Date 12/13/20      PT LONG TERM GOAL #2   Title Pt will decrease TUG to below 14 seconds/decrease in order to demonstrate decreased fall risk.    Baseline 09/20/20: 19.2s    Time 12    Period Weeks    Status New    Target Date 12/13/20      PT LONG TERM GOAL #3   Title Pt will decrease 5TSTS by at least 3 seconds in order to demonstrate clinically significant  improvement in LE strength.    Baseline 09/20/20: 23.1s with minA+1 for strength and balance (Previous discharge 03/06/20: 16.1s)    Time 12    Period Weeks    Status New    Target Date 12/13/20      PT LONG TERM GOAL #4   Title Pt will increase his self-selected 10MWT to >0.74 m/s in order to demonstrate improvement in gait speed and improved community ambulation    Baseline 09/20/20: self-selected: 14.4s = 0.69 m/s with rollator (Previous discharge 03/06/20: self-selected: 13.6s = 0.74 m/s)    Time 12    Period Weeks    Status New    Target Date 12/13/20                 Plan - 11/06/20 1111    Clinical Impression Statement The patient demonstrated great motivation during session today. Does intermittently need cueing to re-direct to task. Few seated rest breaks utilized to address fatigue noted with exercises but able to complete all interventions as planned. The patient may benefit from an increase to 4-5lb ankle weights next session, and pt verbalized his eagerness to attempt. The patient would benefit from further skilled PT intervention to continue to progress towards goals.    Personal Factors and Comorbidities Age;Comorbidity 3+    Comorbidities HTN, CVA, cholecystitis with cholecystostomy drain    Examination-Activity Limitations Bend;Lift;Squat;Locomotion Level;Stairs;Stand    Examination-Participation Restrictions Community Activity;Driving;Laundry;Meal Prep;Shop    Stability/Clinical Decision Making Unstable/Unpredictable    Rehab Potential Fair    PT Frequency 2x / week    PT Duration 12 weeks    PT Treatment/Interventions ADLs/Self Care Home Management;Canalith Repostioning;Cryotherapy;Electrical Stimulation;Iontophoresis 4mg /ml Dexamethasone;Moist Heat;Traction;Ultrasound;DME Instruction;Gait training;Stair training;Therapeutic activities;Therapeutic exercise;Functional mobility training;Balance training;Neuromuscular re-education;Patient/family education;Manual  techniques;Dry needling;Vestibular;Joint Manipulations;Spinal Manipulations;Aquatic Therapy    PT Next Visit Plan 2MWT    PT Home Exercise Plan 09/20/20:  None currently. Previous episode access code: [email or web address]    Consulted and Agree with Plan of Care Patient    Family Member Consulted granddaughter           Patient will benefit from skilled therapeutic intervention in order to improve the following deficits and impairments:  Abnormal gait, Decreased balance, Decreased mobility, Difficulty walking, Decreased knowledge of precautions, Decreased safety awareness, Decreased activity tolerance, Decreased strength, Decreased endurance  Visit Diagnosis: Muscle weakness (generalized)  Unsteadiness on feet  Other abnormalities of gait and mobility  Decreased strength of lower extremity  Other lack of coordination  Great toe pain, right  Right middle cerebral artery stroke ([place])  Lethargy     Problem List Patient Active Problem List   Diagnosis Date Noted  . Calculus of bile duct without cholecystitis and without obstruction   . Non-small cell carcinoma of lung, right ([place]) 09/07/2020  . Mass of upper lobe of right lung 07/13/2020  . Calculus of bile duct with cholecystitis without obstruction   . Abnormal findings on imaging of biliary tract   . Acute cholecystitis 05/02/2020  . Chronic venous insufficiency 12/05/2019  . 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 CKD (chronic kidney disease), stage II   . New onset atrial fibrillation ([place])   . 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 Goals of care, counseling/discussion   . Palliative care by specialist   . DNR (do not resuscitate) discussion   . History of CVA (cerebrovascular accident) 05/22/2019  . Persistent proteinuria 07/30/2018  . 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 Chronic kidney disease, stage III (moderate) ([place]) 06/15/2018  . Leg pain 05/06/2018  . Lymphedema 05/06/2018   . Coagulopathy ([place]) 07/07/2017  . Lumbar spondylosis 03/10/2017  . Elevated uric acid in blood 11/20/2016  . BPH (benign prostatic hyperplasia) 05/29/2016  . Peripheral vascular disease of lower extremity ([place]) 03/03/2016  . Gallstone 12/31/2015  . 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 Atherosclerosis of aorta ([place]) 11/22/2015  . Carotid artery narrowing 11/22/2015  . Diverticulosis of colon 11/22/2015  . Decreased creatinine clearance 11/22/2015  . Arthritis, degenerative 11/22/2015  . Lactose intolerance 11/22/2015  . Basal cell carcinoma 11/22/2015  . Essential hypertension 06/21/2015  . Hyperlipemia 06/21/2015  . GERD (gastroesophageal reflux disease) 06/21/2015  . Calculus of kidney 11/18/2013    Lieutenant Diego PT, DPT 1:12 PM,11/06/20   [place] MAIN [facility] SERVICES [address] [place], [place], [address] Phone: [phone]   Fax:  [phone]  Name: Jose Castro MRN: [id number] Date of Birth: 1926/06/28

## 2020-11-08 ENCOUNTER — Other Ambulatory Visit: Payer: Self-pay | Admitting: General Surgery

## 2020-11-08 ENCOUNTER — Ambulatory Visit: Payer: Medicare Other | Admitting: Physical Therapy

## 2020-11-08 DIAGNOSIS — K819 Cholecystitis, unspecified: Secondary | ICD-10-CM

## 2020-11-13 ENCOUNTER — Ambulatory Visit: Payer: Medicare Other | Admitting: Physical Therapy

## 2020-11-13 ENCOUNTER — Encounter: Payer: Self-pay | Admitting: Physical Therapy

## 2020-11-13 ENCOUNTER — Other Ambulatory Visit: Payer: Self-pay

## 2020-11-13 DIAGNOSIS — R2689 Other abnormalities of gait and mobility: Secondary | ICD-10-CM

## 2020-11-13 DIAGNOSIS — M6281 Muscle weakness (generalized): Secondary | ICD-10-CM

## 2020-11-13 DIAGNOSIS — R2681 Unsteadiness on feet: Secondary | ICD-10-CM

## 2020-11-13 NOTE — Patient Instructions (Signed)
°    Results (09/20/20) Results 11/13/20 Comments  BERG 32/56 32/56 Fall risk, in need of intervention, <36/56 indicates 100% risk for falls  TUG 19.2 seconds 17.48 sec with RW >14 sec indicates high Fall risk, in need of intervention  5TSTS 23.1 seconds 20 sec with hands pushing on thighs Pt requiring minA+1 to complete test; >15 sec indicates high fall risk  6 Minute Walk Test Deferred  Deferred  10 Meter Gait Speed Self-selected: 14.4s = 0.69 m/s; Fastest: 10.7s = 0.93 m/s Self selected: 13.4 sec (0.75 m/s) Fast: 11.42 sec (0.87 m/s) Below normative values (<1.0 m/s) for full community ambulation  FOTO 83 74.24% Predicted decline to 79

## 2020-11-13 NOTE — Therapy (Signed)
[facility] MAIN [facility] SERVICES [address] [place], [place], [address] Phone: [phone]   Fax:  [phone]  Physical Therapy Treatment Physical Therapy Progress Note   Dates of reporting period  09/20/20   to   11/13/20   Patient Details  Name: Jose Castro MRN: [id number] Date of Birth: 1926-01-04 Referring Provider (PT): Dr. Carlynn Purl   Encounter Date: 11/13/2020   PT End of Session - 11/13/20 1113    Visit Number 10    Number of Visits 25    Date for PT Re-Evaluation 12/13/20    Authorization Type eval: 09/20/20    PT Start Time 1102    PT Stop Time 1145    PT Time Calculation (min) 43 min    Equipment Utilized During Treatment Gait belt    Activity Tolerance Patient tolerated treatment well;Patient limited by fatigue;Patient limited by pain    Behavior During Therapy [facility] for tasks assessed/performed           Past Medical History:  Diagnosis Date  . Allergy    Penicillin  . Arthritis   . Basal cell carcinoma   . BPH (benign prostatic hyperplasia)   . Chronic kidney disease    had a kidney stone which per family was a cyst that was removed  . Dysrhythmia   . GERD (gastroesophageal reflux disease)   . History of kidney stones   . Hyperlipidemia   . Hypertension   . Left-sided low back pain with left-sided sciatica   . Lung mass   . Splenic vein thrombosis   . Stroke (HCC) 05/22/2019  . Thrombosis 12/2015   mural  area and no notation of heart attack    Past Surgical History:  Procedure Laterality Date  . APPENDECTOMY    . CATARACT EXTRACTION, BILATERAL    . CYSTOSCOPY WITH INSERTION OF UROLIFT    . ENDOSCOPIC RETROGRADE CHOLANGIOPANCREATOGRAPHY (ERCP) WITH PROPOFOL N/A 05/11/2020   Procedure: ENDOSCOPIC RETROGRADE CHOLANGIOPANCREATOGRAPHY (ERCP) WITH PROPOFOL;  Surgeon: Midge Minium, MD;  Location: [facility] ENDOSCOPY;  Service: Endoscopy;  Laterality: N/A;  . ERCP N/A 10/23/2020   Procedure: ENDOSCOPIC RETROGRADE  CHOLANGIOPANCREATOGRAPHY (ERCP);  Surgeon: Midge Minium, MD;  Location: [facility] ENDOSCOPY;  Service: Endoscopy;  Laterality: N/A;  . EYE SURGERY  08/2018  . IR EXCHANGE BILIARY DRAIN  05/04/2020  . KIDNEY STONE SURGERY    . KNEE SURGERY    . PACEMAKER INSERTION N/A 07/13/2018   Procedure: INSERTION PACEMAKER-DUEL CHAMBER INITIAL IMPLANT;  Surgeon: Marcina Millard, MD;  Location: [facility] ORS;  Service: Cardiovascular;  Laterality: N/A;  . SPINE SURGERY      There were no vitals filed for this visit.   Subjective Assessment - 11/13/20 1111    Subjective Patient reports doing about the same. He denies any new falls. He reports continued LLE knee pain and intermittent foot pain. Reports his foot pain is better than last week. Reports adherence with tband exercises 4 days in last week;    Patient is accompained by: Family member    Pertinent History Pt referred for deconditioning and gait instability. He has had a decline since he last finished therapy. Pt had a hospital admission from 05/02/20 to 05/14/20 for acute cholecystitis and is now s/p cholecystostomy drain placement. Drain is still in place and pt is waiting to hear when they plan to remove it. Pt discharged to SNF and then returned home. In addition pt underwent radiation therapy for a RUL lung mass/cancer and had his last radiation  treatment in September of 2021. He reports that he has continued to walk at home as his primary exercise. Pt was previously seen at this clinic for PT/OT due to weakness/imbalance s/p CVA. Prior history from 09/21/19: Pt is a 86 year old male who presents with imbalance and difficulty with gait following a right frontal CVA with left hemiparesis on 05/22/19.  He has completed inpatient and home health PT, and now presents for OP PT.  He was discharged from inpatient rehab at a supervision level of assistance, ambulating with a RW.  His family notes that he is impulsive and will try to ambulate without his RW at home.  Family  is currently providing 24/7 supervision.    Limitations House hold activities;Walking    How long can you sit comfortably? no limitations    How long can you walk comfortably? he requires a RW    Patient Stated Goals Pt wants to be able to walk his granddaughter down the aisle at her wedding November 2022    Currently in Pain? Yes    Pain Score 7     Pain Location Knee    Pain Orientation Left    Pain Descriptors / Indicators Aching;Sore    Pain Type Chronic pain    Pain Onset More than a month ago    Pain Frequency Intermittent    Aggravating Factors  worse with weight bearing/bending knee    Pain Relieving Factors rest    Effect of Pain on Daily Activities decreased activity tolerance;    Multiple Pain Sites No              TREATMENT: Warm up on Crosstrainer, level 0 x4 min with cues to increase speed to >40 steps per minute for cardiovascular challenge;   Patient instructed in outcome measures to address progress towards goals, see below:    FUNCTIONAL OUTCOME MEASURES     Results (09/20/20) Results 11/13/20 Comments  BERG 32/56 32/56 Fall risk, in need of intervention  TUG 19.2 seconds 17.48 sec with RW Fall risk, in need of intervention  5TSTS 23.1 seconds 20 sec with hands pushing on thighs Pt requiring minA+1 to complete test; >15 sec indicates high fall risk  6 Minute Walk Test Deferred  Deferred  10 Meter Gait Speed Self-selected: 14.4s = 0.69 m/s; Fastest: 10.7s = 0.93 m/s Self selected: 13.4 sec (0.75 m/s) Fast: 11.42 sec (0.87 m/s) Below normative values (<1.0 m/s) for full community ambulation  FOTO 83 74.24% Predicted decline to 48     Patient educated on importance of HEP adherence. He verbalized understanding.  Patient's condition has the potential to improve in response to therapy. Maximum improvement is yet to be obtained. The anticipated improvement is attainable and reasonable in a generally predictable time.  Patient reports that he has been  doing his exercise, however his family reports that they never see him exercising. He has missed several appointments due to not feeling well/holidays which has made improvement limited.                     PT Education - 11/13/20 1112    Education Details exercise technique, body mechanics, progress towards goals;    Person(s) Educated Patient    Methods Explanation;Verbal cues    Comprehension Verbalized understanding;Returned demonstration;Verbal cues required;Need further instruction            PT Short Term Goals - 11/13/20 1114      PT SHORT TERM GOAL #1   Title  Pt will be independent with HEP in order to improve strength and balance in order to decrease fall risk and improve function at home.    Time 6    Period Weeks    Status On-going    Target Date 11/01/20             PT Long Term Goals - 11/13/20 1114      PT LONG TERM GOAL #1   Title Pt will improve BERG by at least 3 points in order to demonstrate clinically significant improvement in balance.    Baseline 09/20/20: 32/56 (Previous discharge: 03/06/20: 46/56), 12/14: 32/56    Time 12    Period Weeks    Status Not Met    Target Date 12/13/20      PT LONG TERM GOAL #2   Title Pt will decrease TUG to below 14 seconds/decrease in order to demonstrate decreased fall risk.    Baseline 09/20/20: 19.2s, 12/14: 17.48    Time 12    Period Weeks    Status Partially Met    Target Date 12/13/20      PT LONG TERM GOAL #3   Title Pt will decrease 5TSTS by at least 3 seconds in order to demonstrate clinically significant improvement in LE strength.    Baseline 09/20/20: 23.1s with minA+1 for strength and balance (Previous discharge 03/06/20: 16.1s), 12/14: 20 sec    Time 12    Period Weeks    Status Partially Met    Target Date 12/13/20      PT LONG TERM GOAL #4   Title Pt will increase his self-selected 10MWT to >0.74 m/s in order to demonstrate improvement in gait speed and improved community  ambulation    Baseline 09/20/20: self-selected: 14.4s = 0.69 m/s with rollator (Previous discharge 03/06/20: self-selected: 13.6s = 0.74 m/s), 12/14: 0.75 m/s    Time 12    Period Weeks    Status Partially Met    Target Date 12/13/20                 Plan - 11/13/20 1206    Clinical Impression Statement patient motivated and participated fair within session. Instructed patient in outcome measures to address progress towards goals. He has made some improvements in gait speed and transfer ability. However he exhibits no change in balance as evidenced on Berg Balance Assessment. Patient also exhibits a decline on FOTO score however a decline was predicted based on his PMH. Patient reports adherence with HEP however family states that they never see him exercising and that he sits most of the day. He has missed several PT appointments due to not feeling well. He would benefit from additional skilled PT Intervention to improve strength, balance and mobility. Reinforced importance of purposeful exercise/activity at home to improve endurance and standing tolerance. Patient verbalized understanding. He is supposed to get his chole drain removed next week;    Personal Factors and Comorbidities Age;Comorbidity 3+    Comorbidities HTN, CVA, cholecystitis with cholecystostomy drain    Examination-Activity Limitations Bend;Lift;Squat;Locomotion Level;Stairs;Stand    Examination-Participation Restrictions Community Activity;Driving;Laundry;Meal Prep;Shop    Stability/Clinical Decision Making Unstable/Unpredictable    Rehab Potential Fair    PT Frequency 2x / week    PT Duration 12 weeks    PT Treatment/Interventions ADLs/Self Care Home Management;Canalith Repostioning;Cryotherapy;Electrical Stimulation;Iontophoresis 53m/ml Dexamethasone;Moist Heat;Traction;Ultrasound;DME Instruction;Gait training;Stair training;Therapeutic activities;Therapeutic exercise;Functional mobility training;Balance  training;Neuromuscular re-education;Patient/family education;Manual techniques;Dry needling;Vestibular;Joint Manipulations;Spinal Manipulations;Aquatic Therapy    PT Next Visit Plan 2MWT  PT Home Exercise Plan 09/20/20: None currently. Previous episode access code: [email or web address]    Consulted and Agree with Plan of Care Patient    Family Member Consulted granddaughter           Patient will benefit from skilled therapeutic intervention in order to improve the following deficits and impairments:  Abnormal gait,Decreased balance,Decreased mobility,Difficulty walking,Decreased knowledge of precautions,Decreased safety awareness,Decreased activity tolerance,Decreased strength,Decreased endurance  Visit Diagnosis: Muscle weakness (generalized)  Unsteadiness on feet  Other abnormalities of gait and mobility     Problem List Patient Active Problem List   Diagnosis Date Noted  . Calculus of bile duct without cholecystitis and without obstruction   . Non-small cell carcinoma of lung, right ([place]) 09/07/2020  . Mass of upper lobe of right lung 07/13/2020  . Calculus of bile duct with cholecystitis without obstruction   . Abnormal findings on imaging of biliary tract   . Acute cholecystitis 05/02/2020  . Chronic venous insufficiency 12/05/2019  . 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 CKD (chronic kidney disease), stage II   . New onset atrial fibrillation ([place])   . 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 Goals of care, counseling/discussion   . Palliative care by specialist   . DNR (do not resuscitate) discussion   . History of CVA (cerebrovascular accident) 05/22/2019  . Persistent proteinuria 07/30/2018  . 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 Chronic kidney disease, stage III (moderate) ([place]) 06/15/2018  . Leg pain 05/06/2018  . Lymphedema 05/06/2018  . Coagulopathy ([place]) 07/07/2017  . Lumbar spondylosis 03/10/2017  . Elevated uric acid in  blood 11/20/2016  . BPH (benign prostatic hyperplasia) 05/29/2016  . Peripheral vascular disease of lower extremity ([place]) 03/03/2016  . Gallstone 12/31/2015  . 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 Atherosclerosis of aorta ([place]) 11/22/2015  . Carotid artery narrowing 11/22/2015  . Diverticulosis of colon 11/22/2015  . Decreased creatinine clearance 11/22/2015  . Arthritis, degenerative 11/22/2015  . Lactose intolerance 11/22/2015  . Basal cell carcinoma 11/22/2015  . Essential hypertension 06/21/2015  . Hyperlipemia 06/21/2015  . GERD (gastroesophageal reflux disease) 06/21/2015  . Calculus of kidney 11/18/2013    Natha Guin PT, DPT 11/14/2020, 8:18 AM  [place] MAIN [facility] SERVICES [address] [place], [place], [address] Phone: [phone]   Fax:  [phone]  Name: ISA KOHLENBERG MRN: [id number] Date of Birth: February 28, 1926

## 2020-11-14 NOTE — Telephone Encounter (Signed)
open encounter in error 

## 2020-11-15 ENCOUNTER — Ambulatory Visit: Payer: Medicare Other | Admitting: Physical Therapy

## 2020-11-15 ENCOUNTER — Ambulatory Visit
Admission: RE | Admit: 2020-11-15 | Discharge: 2020-11-15 | Disposition: A | Discharge status: Home / Self Care | Payer: Medicare Other | Source: Ambulatory Visit | Attending: General Surgery | Admitting: General Surgery

## 2020-11-15 ENCOUNTER — Other Ambulatory Visit: Payer: Self-pay

## 2020-11-15 DIAGNOSIS — K819 Cholecystitis, unspecified: Secondary | ICD-10-CM | POA: Insufficient documentation

## 2020-11-15 MED ORDER — IOHEXOL 300 MG/ML  SOLN
25.0000 mL | Freq: Once | INTRAMUSCULAR | Status: AC | PRN
  Administered 2020-11-15: 25 mL

## 2020-11-15 NOTE — Procedures (Signed)
Interventional Radiology Procedure Note  Procedure: Cholecystostomy tube check  Findings: Please refer to procedural dictation for full description. Patent, non-dilated cystic duct and common bile duct.  No evidence of choledocholithiasis.  Tube removed successfully.  Complications: None immediate  Estimated Blood Loss: None  Recommendations: None.   Ruthann Cancer, MD

## 2020-11-20 ENCOUNTER — Ambulatory Visit: Payer: Medicare Other | Admitting: Physical Therapy

## 2020-11-22 ENCOUNTER — Other Ambulatory Visit (INDEPENDENT_AMBULATORY_CARE_PROVIDER_SITE_OTHER): Payer: Self-pay | Admitting: Nurse Practitioner

## 2020-11-22 DIAGNOSIS — I739 Peripheral vascular disease, unspecified: Secondary | ICD-10-CM

## 2020-11-27 ENCOUNTER — Ambulatory Visit: Payer: Medicare Other | Admitting: Physical Therapy

## 2020-11-28 ENCOUNTER — Ambulatory Visit
Admission: RE | Admit: 2020-11-28 | Discharge: 2020-11-28 | Disposition: A | Discharge status: Home / Self Care | Payer: Medicare Other | Source: Ambulatory Visit | Attending: Family Medicine | Admitting: Family Medicine

## 2020-11-28 ENCOUNTER — Other Ambulatory Visit: Payer: Self-pay

## 2020-11-28 VITALS — BP 119/79 | HR 61 | Temp 98.0°F | Resp 18 | Ht 70.0 in | Wt 160.1 lb

## 2020-11-28 DIAGNOSIS — R079 Chest pain, unspecified: Secondary | ICD-10-CM

## 2020-11-28 DIAGNOSIS — K219 Gastro-esophageal reflux disease without esophagitis: Secondary | ICD-10-CM | POA: Diagnosis not present

## 2020-11-28 MED ORDER — LIDOCAINE VISCOUS HCL 2 % MT SOLN
15.0000 mL | Freq: Once | OROMUCOSAL | Status: AC
  Administered 2020-11-28: 15:00:00 15 mL via ORAL

## 2020-11-28 MED ORDER — ONDANSETRON 4 MG PO TBDP: 4.0000 mg | ORAL_TABLET | Freq: Three times a day (TID) | ORAL | 0 refills | Status: DC | PRN

## 2020-11-28 MED ORDER — ALUM & MAG HYDROXIDE-SIMETH 200-200-20 MG/5ML PO SUSP
30.0000 mL | Freq: Once | ORAL | Status: AC
  Administered 2020-11-28: 15:00:00 30 mL via ORAL

## 2020-11-28 NOTE — Discharge Instructions (Addendum)
I believe this is related to acid reflux. Restart the Protonix daily. I have prescribed Zofran to use as needed for nausea, vomiting Avoid foods that could be irritating to the stomach. I have put some information in your discharge instructions about this. For any continued symptoms please follow-up with your GI specialist

## 2020-11-28 NOTE — ED Triage Notes (Signed)
Per family, pt had an episode of emesis on yesterday s/p eating some tomato soup. Also reports having some chest pain.

## 2020-11-29 ENCOUNTER — Ambulatory Visit: Payer: Medicare Other | Admitting: Physical Therapy

## 2020-11-29 NOTE — ED Provider Notes (Signed)
Jose Castro    CSN: [id number] Arrival date & time: 11/28/20  1401      History   Chief Complaint Chief Complaint  Patient presents with  . Appointment  . Gastroesophageal Reflux    HPI Jose Castro is a 86 y.o. male.   Patient is a 86 year old male with past medical history of allergy, arthritis, BPH, chronic kidney disease, dysrhythmia, GERD, hyperlipidemia, hypertension, stroke, cholecystitis.  He presents today with chest discomfort, chest burning, vomiting over the past week or so.  Had episode of vomiting after eating tomato soup yesterday.  Was previously on Protonix but has not been on this in a few weeks.  Some hoarseness in throat.  No cough, shortness of breath.  No dizziness, weakness, headache, blurred vision.     Past Medical History:  Diagnosis Date  . Allergy    Penicillin  . Arthritis   . Basal cell carcinoma   . BPH (benign prostatic hyperplasia)   . Chronic kidney disease    had a kidney stone which per family was a cyst that was removed  . Dysrhythmia   . GERD (gastroesophageal reflux disease)   . History of kidney stones   . Hyperlipidemia   . Hypertension   . Left-sided low back pain with left-sided sciatica   . Lung mass   . Splenic vein thrombosis   . Stroke ([place]) 05/22/2019  . Thrombosis 12/2015   mural  area and no notation of heart attack    Patient Active Problem List   Diagnosis Date Noted  . Calculus of bile duct without cholecystitis and without obstruction   . Non-small cell carcinoma of lung, right ([place]) 09/07/2020  . Mass of upper lobe of right lung 07/13/2020  . Calculus of bile duct with cholecystitis without obstruction   . Abnormal findings on imaging of biliary tract   . Acute cholecystitis 05/02/2020  . Chronic venous insufficiency 12/05/2019  . 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 CKD (chronic kidney disease), stage II   . New onset atrial fibrillation ([place])   . Right middle cerebral artery  stroke ([place]) 05/27/2019  . Goals of care, counseling/discussion   . Palliative care by specialist   . DNR (do not resuscitate) discussion   . History of CVA (cerebrovascular accident) 05/22/2019  . Persistent proteinuria 07/30/2018  . 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 Chronic kidney disease, stage III (moderate) ([place]) 06/15/2018  . Leg pain 05/06/2018  . Lymphedema 05/06/2018  . Coagulopathy ([place]) 07/07/2017  . Lumbar spondylosis 03/10/2017  . Elevated uric acid in blood 11/20/2016  . BPH (benign prostatic hyperplasia) 05/29/2016  . Peripheral vascular disease of lower extremity ([place]) 03/03/2016  . Gallstone 12/31/2015  . 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 Atherosclerosis of aorta ([place]) 11/22/2015  . Carotid artery narrowing 11/22/2015  . Diverticulosis of colon 11/22/2015  . Decreased creatinine clearance 11/22/2015  . Arthritis, degenerative 11/22/2015  . Lactose intolerance 11/22/2015  . Basal cell carcinoma 11/22/2015  . Essential hypertension 06/21/2015  . Hyperlipemia 06/21/2015  . GERD (gastroesophageal reflux disease) 06/21/2015  . Calculus of kidney 11/18/2013    Past Surgical History:  Procedure Laterality Date  . APPENDECTOMY    . CATARACT EXTRACTION, BILATERAL    . CYSTOSCOPY WITH INSERTION OF UROLIFT    . ENDOSCOPIC RETROGRADE CHOLANGIOPANCREATOGRAPHY (ERCP) WITH PROPOFOL N/A 05/11/2020   Procedure: ENDOSCOPIC RETROGRADE CHOLANGIOPANCREATOGRAPHY (ERCP) WITH PROPOFOL;  Surgeon: [name],  MD;  Location: [facility] ENDOSCOPY;  Service: Endoscopy;  Laterality: N/A;  . ERCP N/A 10/23/2020   Procedure: ENDOSCOPIC RETROGRADE CHOLANGIOPANCREATOGRAPHY (ERCP);  Surgeon: [name], MD;  Location: [facility] ENDOSCOPY;  Service: Endoscopy;  Laterality: N/A;  . EYE SURGERY  08/2018  . IR EXCHANGE BILIARY DRAIN  05/04/2020  . KIDNEY STONE SURGERY    . KNEE SURGERY    . PACEMAKER INSERTION N/A 07/13/2018   Procedure: INSERTION  PACEMAKER-DUEL CHAMBER INITIAL IMPLANT;  Surgeon: Isaias Cowman, MD;  Location: [facility] ORS;  Service: Cardiovascular;  Laterality: N/A;  . SPINE SURGERY         Home Medications    Prior to Admission medications   Medication Sig Start Date End Date Taking? Authorizing Provider  ondansetron (ZOFRAN ODT) 4 MG disintegrating tablet Take 1 tablet (4 mg total) by mouth every 8 (eight) hours as needed for nausea or vomiting. 11/28/20  Yes Lamin Chandley A, NP  acetaminophen (TYLENOL) 325 MG tablet Take 2 tablets (650 mg total) by mouth every 4 (four) hours as needed for mild pain (or temp > 37.5 C (99.5 F)). Patient taking differently: No sig reported 06/07/19   Angiulli, Lavon Paganini, PA-C  apixaban (ELIQUIS) 2.5 MG TABS tablet Take 1 tablet (2.5 mg total) by mouth 2 (two) times daily. 06/07/19   Angiulli, Lavon Paganini, PA-C  Cholecalciferol (VITAMIN D3) 50 MCG (2000 UT) TABS Take 2,000 Units by mouth daily. 06/07/19   Angiulli, Lavon Paganini, PA-C  Ensure Max Protein (ENSURE MAX PROTEIN) LIQD Take 330 mLs (11 oz total) by mouth 2 (two) times daily. 05/14/20   Enzo Bi, MD  finasteride (PROSCAR) 5 MG tablet Take 1 tablet (5 mg total) by mouth daily. 06/07/19   Angiulli, Lavon Paganini, PA-C  methylPREDNISolone (MEDROL DOSEPAK) 4 MG TBPK tablet 6 day dose pack - take as directed 10/01/20   Criselda Peaches, DPM  metoprolol succinate (TOPROL XL) 25 MG 24 hr tablet Take 1 tablet (25 mg total) by mouth daily. 06/07/19   Angiulli, Lavon Paganini, PA-C  Omega-3 Fatty Acids (FISH OIL) 1000 MG CAPS Take 1,000 mg by mouth daily.     [provider]  pantoprazole (PROTONIX) 40 MG tablet TAKE ONE TABLET EVERY DAY 10/31/20   Ancil Boozer, Drue Stager, MD  rosuvastatin (CRESTOR) 40 MG tablet Take 1 tablet (40 mg total) by mouth daily at 6 PM. 06/07/19   Angiulli, Lavon Paganini, PA-C  ursodiol (ACTIGALL) 300 MG capsule Take 300 mg by mouth in the morning and at bedtime. 07/13/20 07/13/21  [provider]    Family History Family History   Problem Relation Age of Onset  . Cancer Brother        bladder cancer with mets  . Heart disease Mother   . Aortic aneurysm Mother   . Heart attack Maternal Aunt   . Heart attack Maternal Uncle     Social History Social History   Tobacco Use  . Smoking status: Former Smoker    Packs/day: 0.00    Years: 16.00    Pack years: 0.00    Types: Cigarettes    Start date: 10/21/1944    Quit date: 10/21/1960    Years since quitting: 60.1  . Smokeless tobacco: Never Used  Vaping Use  . Vaping Use: Never used  Substance Use Topics  . Alcohol use: Yes    Alcohol/week: 3.0 standard drinks    Types: 3 Standard drinks or equivalent per week    Comment: once a week when he goes out  to dinner-socially  . Drug use: No     Allergies   Penicillins   Review of Systems Review of Systems   Physical Exam Triage Vital Signs ED Triage Vitals  Enc Vitals Group     BP 11/28/20 1439 119/79     Pulse Rate 11/28/20 1439 61     Resp 11/28/20 1439 18     Temp 11/28/20 1439 98 F (36.7 C)     Temp Source 11/28/20 1439 Oral     SpO2 11/28/20 1439 97 %     Weight 11/28/20 1446 160 lb 0.9 oz (72.6 kg)     Height 11/28/20 1446 5\' 10"  (1.778 m)     Head Circumference --      Peak Flow --      Pain Score 11/28/20 1425 0     Pain Loc --      Pain Edu? --      Excl. in [place]? --    No data found.  Updated Vital Signs BP 119/79   Pulse 61   Temp 98 F (36.7 C) (Oral)   Resp 18   Ht 5\' 10"  (1.778 m)   Wt 160 lb 0.9 oz (72.6 kg)   SpO2 97%   BMI 22.97 kg/m   Visual Acuity Right Eye Distance:   Left Eye Distance:   Bilateral Distance:    Right Eye Near:   Left Eye Near:    Bilateral Near:     Physical Exam Vitals and nursing note reviewed.  Constitutional:      General: He is not in acute distress.    Appearance: Normal appearance. He is not ill-appearing, toxic-appearing or diaphoretic.  HENT:     Head: Normocephalic and atraumatic.     Nose: Nose normal.      Mouth/Throat:     Pharynx: Oropharynx is clear.  Eyes:     Conjunctiva/sclera: Conjunctivae normal.  Cardiovascular:     Rate and Rhythm: Normal rate and regular rhythm.  Pulmonary:     Effort: Pulmonary effort is normal.     Breath sounds: Normal breath sounds.  Musculoskeletal:        General: Normal range of motion.     Cervical back: Normal range of motion.  Skin:    General: Skin is warm and dry.  Neurological:     Mental Status: He is alert.  Psychiatric:        Mood and Affect: Mood normal.      UC Treatments / Results  Labs (all labs ordered are listed, but only abnormal results are displayed) Labs Reviewed - No data to display  EKG   Radiology No results found.  Procedures Procedures (including critical care time)  Medications Ordered in UC Medications  alum & mag hydroxide-simeth (MAALOX/MYLANTA) 200-200-20 MG/5ML suspension 30 mL (30 mLs Oral Given 11/28/20 1512)    And  lidocaine (XYLOCAINE) 2 % viscous mouth solution 15 mL (15 mLs Oral Given 11/28/20 1512)    Initial Impression / Assessment and Plan / UC Course  I have reviewed the triage vital signs and the nursing notes.  Pertinent labs & imaging results that were available during my care of the patient were reviewed by me and considered in my medical decision making (see chart for details).     GERD Symptoms consistent with this.  EKG with paced rhythm.  Vital signs stable today. Nothing concerning on exam.  Attempted GI cocktail today but patient vomited it back up.  I have prescribed  Zofran to use as needed for nausea, vomiting.  Recommended restart Protonix daily. Avoid foods that can be irritating to the stomach Recommended follow-up with GI as needed Final Clinical Impressions(s) / UC Diagnoses   Final diagnoses:  Gastroesophageal reflux disease, unspecified whether esophagitis present     Discharge Instructions     I believe this is related to acid reflux. Restart the Protonix  daily. I have prescribed Zofran to use as needed for nausea, vomiting Avoid foods that could be irritating to the stomach. I have put some information in your discharge instructions about this. For any continued symptoms please follow-up with your GI specialist    ED Prescriptions    Medication Sig Dispense Auth. Provider   ondansetron (ZOFRAN ODT) 4 MG disintegrating tablet Take 1 tablet (4 mg total) by mouth every 8 (eight) hours as needed for nausea or vomiting. 20 tablet Loura Halt A, NP     PDMP not reviewed this encounter.   Orvan July, NP 11/29/20 1259

## 2020-12-03 ENCOUNTER — Ambulatory Visit (INDEPENDENT_AMBULATORY_CARE_PROVIDER_SITE_OTHER): Payer: Medicare Other | Admitting: Vascular Surgery

## 2020-12-03 ENCOUNTER — Encounter (INDEPENDENT_AMBULATORY_CARE_PROVIDER_SITE_OTHER): Payer: Medicare Other

## 2020-12-04 ENCOUNTER — Other Ambulatory Visit: Payer: Self-pay

## 2020-12-04 ENCOUNTER — Ambulatory Visit: Payer: Medicare Other | Admitting: Physical Therapy

## 2020-12-04 ENCOUNTER — Ambulatory Visit: Payer: Medicare Other | Attending: Family Medicine | Admitting: Physical Therapy

## 2020-12-04 DIAGNOSIS — R5383 Other fatigue: Secondary | ICD-10-CM | POA: Diagnosis present

## 2020-12-04 DIAGNOSIS — R2689 Other abnormalities of gait and mobility: Secondary | ICD-10-CM | POA: Diagnosis present

## 2020-12-04 DIAGNOSIS — I63511 Cerebral infarction due to unspecified occlusion or stenosis of right middle cerebral artery: Secondary | ICD-10-CM | POA: Insufficient documentation

## 2020-12-04 DIAGNOSIS — M79674 Pain in right toe(s): Secondary | ICD-10-CM | POA: Insufficient documentation

## 2020-12-04 DIAGNOSIS — R2681 Unsteadiness on feet: Secondary | ICD-10-CM | POA: Diagnosis present

## 2020-12-04 DIAGNOSIS — M6281 Muscle weakness (generalized): Secondary | ICD-10-CM | POA: Diagnosis present

## 2020-12-04 DIAGNOSIS — R29898 Other symptoms and signs involving the musculoskeletal system: Secondary | ICD-10-CM | POA: Insufficient documentation

## 2020-12-04 DIAGNOSIS — R278 Other lack of coordination: Secondary | ICD-10-CM | POA: Insufficient documentation

## 2020-12-04 NOTE — Therapy (Signed)
[place] MAIN [facility] SERVICES [address] [place], [place], [address] Phone: [phone]   Fax:  [phone]  Physical Therapy Treatment  Patient Details  Name: Jose Castro MRN: [id number] Date of Birth: [birth year] Referring Provider (PT): Dr. Ancil Boozer   Encounter Date: 12/04/2020   PT End of Session - 12/04/20 1540    Visit Number 11    Number of Visits 25    Date for PT Re-Evaluation 12/13/20    Authorization Type eval: 09/20/20    PT Start Time 1530    PT Stop Time 1615    PT Time Calculation (min) 45 min    Equipment Utilized During Treatment Gait belt    Activity Tolerance Patient tolerated treatment well;Patient limited by fatigue;Patient limited by pain    Behavior During Therapy [facility] for tasks assessed/performed           Past Medical History:  Diagnosis Date  . Allergy    Penicillin  . Arthritis   . Basal cell carcinoma   . BPH (benign prostatic hyperplasia)   . Chronic kidney disease    had a kidney stone which per family was a cyst that was removed  . Dysrhythmia   . GERD (gastroesophageal reflux disease)   . History of kidney stones   . Hyperlipidemia   . Hypertension   . Left-sided low back pain with left-sided sciatica   . Lung mass   . Splenic vein thrombosis   . Stroke ([place]) 05/22/2019  . Thrombosis 12/2015   mural  area and no notation of heart attack    Past Surgical History:  Procedure Laterality Date  . APPENDECTOMY    . CATARACT EXTRACTION, BILATERAL    . CYSTOSCOPY WITH INSERTION OF UROLIFT    . ENDOSCOPIC RETROGRADE CHOLANGIOPANCREATOGRAPHY (ERCP) WITH PROPOFOL N/A 05/11/2020   Procedure: ENDOSCOPIC RETROGRADE CHOLANGIOPANCREATOGRAPHY (ERCP) WITH PROPOFOL;  Surgeon: [name], MD;  Location: [facility] ENDOSCOPY;  Service: Endoscopy;  Laterality: N/A;  . ERCP N/A 10/23/2020   Procedure: ENDOSCOPIC RETROGRADE CHOLANGIOPANCREATOGRAPHY (ERCP);  Surgeon: [name], MD;  Location: [facility] ENDOSCOPY;   Service: Endoscopy;  Laterality: N/A;  . EYE SURGERY  08/2018  . IR EXCHANGE BILIARY DRAIN  05/04/2020  . KIDNEY STONE SURGERY    . KNEE SURGERY    . PACEMAKER INSERTION N/A 07/13/2018   Procedure: INSERTION PACEMAKER-DUEL CHAMBER INITIAL IMPLANT;  Surgeon: Isaias Cowman, MD;  Location: [facility] ORS;  Service: Cardiovascular;  Laterality: N/A;  . SPINE SURGERY      There were no vitals filed for this visit.   Subjective Assessment - 12/04/20 1538    Subjective Patient's son reports that he went to the grocery store over the weekennd and had to take multiple seated rest breaks due to muscle fatigue. Patient denies of any falls or injuries since last therapy session.    Patient is accompained by: Family member    Pertinent History Pt referred for deconditioning and gait instability. He has had a decline since he last finished therapy. Pt had a hospital admission from 05/02/20 to 05/14/20 for acute cholecystitis and is now s/p cholecystostomy drain placement. Drain is still in place and pt is waiting to hear when they plan to remove it. Pt discharged to SNF and then returned home. In addition pt underwent radiation therapy for a RUL lung mass/cancer and had his last radiation treatment in September of 2021. He reports that he has continued to walk at home as his primary exercise. Pt was previously seen  at this clinic for PT/OT due to weakness/imbalance s/p CVA. Prior history from 09/21/19: Pt is a 87 year old male who presents with imbalance and difficulty with gait following a right frontal CVA with left hemiparesis on 05/22/19.  He has completed inpatient and home health PT, and now presents for OP PT.  He was discharged from inpatient rehab at a supervision level of assistance, ambulating with a RW.  His family notes that he is impulsive and will try to ambulate without his RW at home.  Family is currently providing 24/7 supervision.    Limitations House hold activities;Walking    How long can you sit  comfortably? no limitations    How long can you walk comfortably? he requires a RW    Patient Stated Goals Pt wants to be able to walk his granddaughter down the aisle at her wedding November 2022    Currently in Pain? No/denies    Pain Onset More than a month ago            Nu Step x L1 x 5 minutes   There Ex: All exercises were performed with 3# ankle weights: Standing hamstring curls x 20 reps each leg Standing hip abduction x 20 reps each leg  Stepping over orange hurdles forward/backward x 10 reps each leg   Sit to stands x 7, x 5 with pushing up from chair using BUE support   Precor leg press: BLE 40# 2x10 with min A to get on/off equipment and min VCS for slow down LE movement for better motor control         PT Short Term Goals - 11/13/20 1114      PT SHORT TERM GOAL #1   Title Pt will be independent with HEP in order to improve strength and balance in order to decrease fall risk and improve function at home.    Time 6    Period Weeks    Status On-going    Target Date 11/01/20             PT Long Term Goals - 11/13/20 1114      PT LONG TERM GOAL #1   Title Pt will improve BERG by at least 3 points in order to demonstrate clinically significant improvement in balance.    Baseline 09/20/20: 32/56 (Previous discharge: 03/06/20: 46/56), 12/14: 32/56    Time 12    Period Weeks    Status Not Met    Target Date 12/13/20      PT LONG TERM GOAL #2   Title Pt will decrease TUG to below 14 seconds/decrease in order to demonstrate decreased fall risk.    Baseline 09/20/20: 19.2s, 12/14: 17.48    Time 12    Period Weeks    Status Partially Met    Target Date 12/13/20      PT LONG TERM GOAL #3   Title Pt will decrease 5TSTS by at least 3 seconds in order to demonstrate clinically significant improvement in LE strength.    Baseline 09/20/20: 23.1s with minA+1 for strength and balance (Previous discharge 03/06/20: 16.1s), 12/14: 20 sec    Time 12    Period Weeks     Status Partially Met    Target Date 12/13/20      PT LONG TERM GOAL #4   Title Pt will increase his self-selected 10MWT to >0.74 m/s in order to demonstrate improvement in gait speed and improved community ambulation    Baseline 09/20/20: self-selected: 14.4s = 0.69 m/s  with rollator (Previous discharge 03/06/20: self-selected: 13.6s = 0.74 m/s), 12/14: 0.75 m/s    Time 12    Period Weeks    Status Partially Met    Target Date 12/13/20                 Plan - 12/04/20 1642    Clinical Impression Statement Patient completed strengthening exercises with fair tolerance to activity. He requires frequent seated rest breaks due to discomfort on LLE and muscle fatigue due to decreased tissue capacity to load knee joint. Therapist provided verbal and visual cues for proper placement of hips and knees to complete tasks. Patient attempts to complete sit to stands without BUE support but is fearful of falling forwards so has to use BUE support to push up from the chair. Patient would benefit from continued PT services to increase BLE strength, mobility, and balance skills to improve patient's quality of life.    Personal Factors and Comorbidities Age;Comorbidity 3+    Comorbidities HTN, CVA, cholecystitis with cholecystostomy drain    Examination-Activity Limitations Bend;Lift;Squat;Locomotion Level;Stairs;Stand    Examination-Participation Restrictions Community Activity;Driving;Laundry;Meal Prep;Shop    Stability/Clinical Decision Making Unstable/Unpredictable    Rehab Potential Fair    PT Frequency 2x / week    PT Duration 12 weeks    PT Treatment/Interventions ADLs/Self Care Home Management;Canalith Repostioning;Cryotherapy;Electrical Stimulation;Iontophoresis 4mg /ml Dexamethasone;Moist Heat;Traction;Ultrasound;DME Instruction;Gait training;Stair training;Therapeutic activities;Therapeutic exercise;Functional mobility training;Balance training;Neuromuscular re-education;Patient/family  education;Manual techniques;Dry needling;Vestibular;Joint Manipulations;Spinal Manipulations;Aquatic Therapy    PT Next Visit Plan 2MWT    PT Home Exercise Plan 09/20/20: None currently. Previous episode access code: [email or web address]    Consulted and Agree with Plan of Care Patient    Family Member Consulted granddaughter           Patient will benefit from skilled therapeutic intervention in order to improve the following deficits and impairments:  Abnormal gait,Decreased balance,Decreased mobility,Difficulty walking,Decreased knowledge of precautions,Decreased safety awareness,Decreased activity tolerance,Decreased strength,Decreased endurance  Visit Diagnosis: Muscle weakness (generalized)  Unsteadiness on feet  Other abnormalities of gait and mobility  Decreased strength of lower extremity  Other lack of coordination  Great toe pain, right  Right middle cerebral artery stroke ([place])  Lethargy     Problem List Patient Active Problem List   Diagnosis Date Noted  . Calculus of bile duct without cholecystitis and without obstruction   . Non-small cell carcinoma of lung, right ([place]) 09/07/2020  . Mass of upper lobe of right lung 07/13/2020  . Calculus of bile duct with cholecystitis without obstruction   . Abnormal findings on imaging of biliary tract   . Acute cholecystitis 05/02/2020  . Chronic venous insufficiency 12/05/2019  . 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 CKD (chronic kidney disease), stage II   . New onset atrial fibrillation ([place])   . 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 Goals of care, counseling/discussion   . Palliative care by specialist   . DNR (do not resuscitate) discussion   . History of CVA (cerebrovascular accident) 05/22/2019  . Persistent proteinuria 07/30/2018  . 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 Chronic kidney disease, stage III (moderate) ([place]) 06/15/2018  . Leg pain 05/06/2018  . Lymphedema  05/06/2018  . Coagulopathy ([place]) 07/07/2017  . Lumbar spondylosis 03/10/2017  . Elevated uric acid in blood 11/20/2016  . BPH (benign prostatic hyperplasia) 05/29/2016  . Peripheral vascular disease of lower extremity ([place]) 03/03/2016  . Gallstone 12/31/2015  . 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 Atherosclerosis of aorta ([place]) 11/22/2015  . Carotid artery narrowing 11/22/2015  . Diverticulosis of colon 11/22/2015  . Decreased creatinine clearance 11/22/2015  . Arthritis, degenerative 11/22/2015  . Lactose intolerance 11/22/2015  . Basal cell carcinoma 11/22/2015  . Essential hypertension 06/21/2015  . Hyperlipemia 06/21/2015  . GERD (gastroesophageal reflux disease) 06/21/2015  . Calculus of kidney 11/18/2013   Karl Luke PT, DPT Netta Corrigan 12/04/2020, 4:47 PM  [place] MAIN [name] SERVICES [address] [place], [place], [address] Phone: [phone]   Fax:  [phone]  Name: Jose Castro MRN: [id number] Date of Birth: Jul 13, 1926

## 2020-12-06 ENCOUNTER — Other Ambulatory Visit: Payer: Self-pay

## 2020-12-06 ENCOUNTER — Ambulatory Visit: Payer: Medicare Other

## 2020-12-06 DIAGNOSIS — M6281 Muscle weakness (generalized): Secondary | ICD-10-CM | POA: Diagnosis not present

## 2020-12-06 DIAGNOSIS — R29898 Other symptoms and signs involving the musculoskeletal system: Secondary | ICD-10-CM

## 2020-12-06 DIAGNOSIS — R2681 Unsteadiness on feet: Secondary | ICD-10-CM

## 2020-12-06 DIAGNOSIS — R2689 Other abnormalities of gait and mobility: Secondary | ICD-10-CM

## 2020-12-06 NOTE — Therapy (Signed)
[place] MAIN [facility] SERVICES [address] [place], [place], [address] Phone: [phone]   Fax:  [phone]  Physical Therapy Treatment  Patient Details  Name: Jose Castro MRN: [id number] Date of Birth: [birth year] Referring Provider (PT): Dr. Ancil Boozer   Encounter Date: 12/06/2020   PT End of Session - 12/06/20 1255    Visit Number 12    Number of Visits 25    Date for PT Re-Evaluation 12/13/20    Authorization Type eval: 09/20/20    PT Start Time 1147    PT Stop Time 1229    PT Time Calculation (min) 42 min    Equipment Utilized During Treatment Gait belt    Activity Tolerance Patient tolerated treatment well;Patient limited by fatigue;Patient limited by pain    Behavior During Therapy [facility] for tasks assessed/performed           Past Medical History:  Diagnosis Date  . Allergy    Penicillin  . Arthritis   . Basal cell carcinoma   . BPH (benign prostatic hyperplasia)   . Chronic kidney disease    had a kidney stone which per family was a cyst that was removed  . Dysrhythmia   . GERD (gastroesophageal reflux disease)   . History of kidney stones   . Hyperlipidemia   . Hypertension   . Left-sided low back pain with left-sided sciatica   . Lung mass   . Splenic vein thrombosis   . Stroke ([place]) 05/22/2019  . Thrombosis 12/2015   mural  area and no notation of heart attack    Past Surgical History:  Procedure Laterality Date  . APPENDECTOMY    . CATARACT EXTRACTION, BILATERAL    . CYSTOSCOPY WITH INSERTION OF UROLIFT    . ENDOSCOPIC RETROGRADE CHOLANGIOPANCREATOGRAPHY (ERCP) WITH PROPOFOL N/A 05/11/2020   Procedure: ENDOSCOPIC RETROGRADE CHOLANGIOPANCREATOGRAPHY (ERCP) WITH PROPOFOL;  Surgeon: [name], MD;  Location: [facility] ENDOSCOPY;  Service: Endoscopy;  Laterality: N/A;  . ERCP N/A 10/23/2020   Procedure: ENDOSCOPIC RETROGRADE CHOLANGIOPANCREATOGRAPHY (ERCP);  Surgeon: [name], MD;  Location: [facility] ENDOSCOPY;   Service: Endoscopy;  Laterality: N/A;  . EYE SURGERY  08/2018  . IR EXCHANGE BILIARY DRAIN  05/04/2020  . KIDNEY STONE SURGERY    . KNEE SURGERY    . PACEMAKER INSERTION N/A 07/13/2018   Procedure: INSERTION PACEMAKER-DUEL CHAMBER INITIAL IMPLANT;  Surgeon: Isaias Cowman, MD;  Location: [facility] ORS;  Service: Cardiovascular;  Laterality: N/A;  . SPINE SURGERY      There were no vitals filed for this visit.   Subjective Assessment - 12/06/20 1254    Subjective Patient presents with son to PT. Reports no falls or LOB since last session. Is having some foot pain but nothing that is limiting.    Patient is accompained by: Family member    Pertinent History Pt referred for deconditioning and gait instability. He has had a decline since he last finished therapy. Pt had a hospital admission from 05/02/20 to 05/14/20 for acute cholecystitis and is now s/p cholecystostomy drain placement. Drain is still in place and pt is waiting to hear when they plan to remove it. Pt discharged to SNF and then returned home. In addition pt underwent radiation therapy for a RUL lung mass/cancer and had his last radiation treatment in September of 2021. He reports that he has continued to walk at home as his primary exercise. Pt was previously seen at this clinic for PT/OT due to weakness/imbalance s/p CVA. Prior history  from 09/21/19: Pt is a 87 year old male who presents with imbalance and difficulty with gait following a right frontal CVA with left hemiparesis on 05/22/19.  He has completed inpatient and home health PT, and now presents for OP PT.  He was discharged from inpatient rehab at a supervision level of assistance, ambulating with a RW.  His family notes that he is impulsive and will try to ambulate without his RW at home.  Family is currently providing 24/7 supervision.    Limitations House hold activities;Walking    How long can you sit comfortably? no limitations    How long can you walk comfortably? he requires  a RW    Patient Stated Goals Pt wants to be able to walk his granddaughter down the aisle at her wedding November 2022    Currently in Pain? Yes    Pain Score 2     Pain Location Foot    Pain Orientation Left    Pain Descriptors / Indicators Aching;Sore    Pain Type Chronic pain    Pain Onset More than a month ago    Pain Frequency Intermittent                   TREATMENT:    Vitals at start of session, BP 116/59  Pulse 72    NMR:    Standing on airex beam:    -tandem stance with 2-0 rail assist, 10 sec hold x4 reps each foot in front;    Forwards/backwards tandem walking with one rail support x4 lengths CGA   -side stepping on airex beam x4 laps each direction with 1 rail assist and min A/CGA  for safety;   -static stand toss ball at target 15 balls SUE support    airex pad: 6" step airex pad sandwhich, lateral stepping up/down SUE support 12x each direction  airex pad 6" step airex pad sandwhich; forward step up/backward step down BUE support.   bosu ball: round side up: step up 10x each LE; BUE support   -balloon taps unsupported 3 x 36min ea (feet together, modified tandem with ea foot in front)   Patient exhibits decreased weight shift to LLE which could be due to increased left knee discomfort and/or LLE weakness, leading to decreased step length on RLE;  As well as more instances of instability with minA to correct with decreased base of support      Therex:   Standing with 3# ankle weight:    -hip flexion march x15 reps each LE with 1 rail assist    -standing hip extension SLR x15 reps each LE with 2 rail assist and mod VCs for erect posture and increased AROM for better hip strengthening;     -hip abduction 15x each LE   Pt educated throughout session about proper posture and technique with exercises. Improved exercise technique, movement at target joints, use of target muscles after min to mod verbal, visual, tactile cues.                      PT Education - 12/06/20 1255    Education Details exercise technique, body mechanics,    Person(s) Educated Patient    Methods Explanation;Demonstration;Tactile cues;Verbal cues    Comprehension Verbalized understanding;Returned demonstration;Verbal cues required;Tactile cues required            PT Short Term Goals - 11/13/20 1114      PT SHORT TERM GOAL #1   Title Pt will  be independent with HEP in order to improve strength and balance in order to decrease fall risk and improve function at home.    Time 6    Period Weeks    Status On-going    Target Date 11/01/20             PT Long Term Goals - 11/13/20 1114      PT LONG TERM GOAL #1   Title Pt will improve BERG by at least 3 points in order to demonstrate clinically significant improvement in balance.    Baseline 09/20/20: 32/56 (Previous discharge: 03/06/20: 46/56), 12/14: 32/56    Time 12    Period Weeks    Status Not Met    Target Date 12/13/20      PT LONG TERM GOAL #2   Title Pt will decrease TUG to below 14 seconds/decrease in order to demonstrate decreased fall risk.    Baseline 09/20/20: 19.2s, 12/14: 17.48    Time 12    Period Weeks    Status Partially Met    Target Date 12/13/20      PT LONG TERM GOAL #3   Title Pt will decrease 5TSTS by at least 3 seconds in order to demonstrate clinically significant improvement in LE strength.    Baseline 09/20/20: 23.1s with minA+1 for strength and balance (Previous discharge 03/06/20: 16.1s), 12/14: 20 sec    Time 12    Period Weeks    Status Partially Met    Target Date 12/13/20      PT LONG TERM GOAL #4   Title Pt will increase his self-selected 10MWT to >0.74 m/s in order to demonstrate improvement in gait speed and improved community ambulation    Baseline 09/20/20: self-selected: 14.4s = 0.69 m/s with rollator (Previous discharge 03/06/20: self-selected: 13.6s = 0.74 m/s), 12/14: 0.75 m/s    Time 12    Period Weeks    Status  Partially Met    Target Date 12/13/20                 Plan - 12/06/20 1658    Clinical Impression Statement Patient improving hip movement with visual target for arc of movement allowing for progression of strengthening within full range of motion. Patient requires cueing for task orientation as he fatigued as well as cues for upright posture. Occasional seated rest breaks required due to fatigue resulting in decreased foot clearance. Patient would benefit from continued PT services to increase BLE strength, mobility, and balance skills to improve patient's quality of life.    Personal Factors and Comorbidities Age;Comorbidity 3+    Comorbidities HTN, CVA, cholecystitis with cholecystostomy drain    Examination-Activity Limitations Bend;Lift;Squat;Locomotion Level;Stairs;Stand    Examination-Participation Restrictions Community Activity;Driving;Laundry;Meal Prep;Shop    Stability/Clinical Decision Making Unstable/Unpredictable    Rehab Potential Fair    PT Frequency 2x / week    PT Duration 12 weeks    PT Treatment/Interventions ADLs/Self Care Home Management;Canalith Repostioning;Cryotherapy;Electrical Stimulation;Iontophoresis 10m/ml Dexamethasone;Moist Heat;Traction;Ultrasound;DME Instruction;Gait training;Stair training;Therapeutic activities;Therapeutic exercise;Functional mobility training;Balance training;Neuromuscular re-education;Patient/family education;Manual techniques;Dry needling;Vestibular;Joint Manipulations;Spinal Manipulations;Aquatic Therapy    PT Next Visit Plan 2MWT    PT Home Exercise Plan 09/20/20: None currently. Previous episode access code: [email or web address]    Consulted and Agree with Plan of Care Patient    Family Member Consulted granddaughter           Patient will benefit from skilled therapeutic intervention in order to improve the following deficits and impairments:  Abnormal gait,Decreased balance,Decreased mobility,Difficulty walking,Decreased knowledge  of  precautions,Decreased safety awareness,Decreased activity tolerance,Decreased strength,Decreased endurance  Visit Diagnosis: Muscle weakness (generalized)  Unsteadiness on feet  Other abnormalities of gait and mobility  Decreased strength of lower extremity     Problem List Patient Active Problem List   Diagnosis Date Noted  . Calculus of bile duct without cholecystitis and without obstruction   . Non-small cell carcinoma of lung, right ([place]) 09/07/2020  . Mass of upper lobe of right lung 07/13/2020  . Calculus of bile duct with cholecystitis without obstruction   . Abnormal findings on imaging of biliary tract   . Acute cholecystitis 05/02/2020  . Chronic venous insufficiency 12/05/2019  . 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 CKD (chronic kidney disease), stage II   . New onset atrial fibrillation ([place])   . 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 Goals of care, counseling/discussion   . Palliative care by specialist   . DNR (do not resuscitate) discussion   . History of CVA (cerebrovascular accident) 05/22/2019  . Persistent proteinuria 07/30/2018  . 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 Chronic kidney disease, stage III (moderate) ([place]) 06/15/2018  . Leg pain 05/06/2018  . Lymphedema 05/06/2018  . Coagulopathy ([place]) 07/07/2017  . Lumbar spondylosis 03/10/2017  . Elevated uric acid in blood 11/20/2016  . BPH (benign prostatic hyperplasia) 05/29/2016  . Peripheral vascular disease of lower extremity ([place]) 03/03/2016  . Gallstone 12/31/2015  . 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 Atherosclerosis of aorta ([place]) 11/22/2015  . Carotid artery narrowing 11/22/2015  . Diverticulosis of colon 11/22/2015  . Decreased creatinine clearance 11/22/2015  . Arthritis, degenerative 11/22/2015  . Lactose intolerance 11/22/2015  . Basal cell carcinoma 11/22/2015  . Essential hypertension 06/21/2015  .  Hyperlipemia 06/21/2015  . GERD (gastroesophageal reflux disease) 06/21/2015  . Calculus of kidney 11/18/2013   Janna Arch, PT, DPT   12/06/2020, 4:59 PM  [place] MAIN [facility] SERVICES [address] [place], [place], [address] Phone: [phone]   Fax:  [phone]  Name: PADDY NEIS MRN: [id number] Date of Birth: 1926-06-28

## 2020-12-07 ENCOUNTER — Other Ambulatory Visit: Payer: Self-pay

## 2020-12-07 ENCOUNTER — Encounter: Payer: Self-pay | Admitting: Podiatry

## 2020-12-07 ENCOUNTER — Ambulatory Visit (INDEPENDENT_AMBULATORY_CARE_PROVIDER_SITE_OTHER): Payer: Medicare Other | Admitting: Podiatry

## 2020-12-07 DIAGNOSIS — M7751 Other enthesopathy of right foot: Secondary | ICD-10-CM | POA: Diagnosis not present

## 2020-12-07 DIAGNOSIS — M205X1 Other deformities of toe(s) (acquired), right foot: Secondary | ICD-10-CM

## 2020-12-10 ENCOUNTER — Other Ambulatory Visit: Payer: Self-pay

## 2020-12-10 ENCOUNTER — Ambulatory Visit: Payer: Medicare Other | Admitting: Physical Therapy

## 2020-12-10 DIAGNOSIS — R29898 Other symptoms and signs involving the musculoskeletal system: Secondary | ICD-10-CM

## 2020-12-10 DIAGNOSIS — R2681 Unsteadiness on feet: Secondary | ICD-10-CM

## 2020-12-10 DIAGNOSIS — M6281 Muscle weakness (generalized): Secondary | ICD-10-CM | POA: Diagnosis not present

## 2020-12-10 DIAGNOSIS — R2689 Other abnormalities of gait and mobility: Secondary | ICD-10-CM

## 2020-12-10 DIAGNOSIS — R278 Other lack of coordination: Secondary | ICD-10-CM

## 2020-12-10 NOTE — Therapy (Signed)
[place] MAIN [facility] SERVICES [address] [place], [place], [address] Phone: [phone]   Fax:  [phone]  Physical Therapy Treatment  Patient Details  Name: Jose Castro MRN: [id number] Date of Birth: [birth year] Referring Provider (PT): Dr. Ancil Boozer   Encounter Date: 12/10/2020   PT End of Session - 12/10/20 1739    Visit Number 13    Number of Visits 25    Date for PT Re-Evaluation 12/13/20    Authorization Type eval: 09/20/20    PT Start Time 1430    PT Stop Time 1515    PT Time Calculation (min) 45 min    Equipment Utilized During Treatment Gait belt    Activity Tolerance Patient tolerated treatment well;Patient limited by fatigue;Patient limited by pain    Behavior During Therapy [facility] for tasks assessed/performed           Past Medical History:  Diagnosis Date  . Allergy    Penicillin  . Arthritis   . Basal cell carcinoma   . BPH (benign prostatic hyperplasia)   . Chronic kidney disease    had a kidney stone which per family was a cyst that was removed  . Dysrhythmia   . GERD (gastroesophageal reflux disease)   . History of kidney stones   . Hyperlipidemia   . Hypertension   . Left-sided low back pain with left-sided sciatica   . Lung mass   . Splenic vein thrombosis   . Stroke ([place]) 05/22/2019  . Thrombosis 12/2015   mural  area and no notation of heart attack    Past Surgical History:  Procedure Laterality Date  . APPENDECTOMY    . CATARACT EXTRACTION, BILATERAL    . CYSTOSCOPY WITH INSERTION OF UROLIFT    . ENDOSCOPIC RETROGRADE CHOLANGIOPANCREATOGRAPHY (ERCP) WITH PROPOFOL N/A 05/11/2020   Procedure: ENDOSCOPIC RETROGRADE CHOLANGIOPANCREATOGRAPHY (ERCP) WITH PROPOFOL;  Surgeon: [name], MD;  Location: [facility] ENDOSCOPY;  Service: Endoscopy;  Laterality: N/A;  . ERCP N/A 10/23/2020   Procedure: ENDOSCOPIC RETROGRADE CHOLANGIOPANCREATOGRAPHY (ERCP);  Surgeon: [name], MD;  Location: [facility] ENDOSCOPY;   Service: Endoscopy;  Laterality: N/A;  . EYE SURGERY  08/2018  . IR EXCHANGE BILIARY DRAIN  05/04/2020  . KIDNEY STONE SURGERY    . KNEE SURGERY    . PACEMAKER INSERTION N/A 07/13/2018   Procedure: INSERTION PACEMAKER-DUEL CHAMBER INITIAL IMPLANT;  Surgeon: Isaias Cowman, MD;  Location: [facility] ORS;  Service: Cardiovascular;  Laterality: N/A;  . SPINE SURGERY      There were no vitals filed for this visit.   Subjective Assessment - 12/10/20 1736    Subjective Patient's daughter reports of patient getting a steroid shot in his ankle earlier today. Patient denies of any falls or injuries since last therapy session.    Patient is accompained by: Family member    Pertinent History Pt referred for deconditioning and gait instability. He has had a decline since he last finished therapy. Pt had a hospital admission from 05/02/20 to 05/14/20 for acute cholecystitis and is now s/p cholecystostomy drain placement. Drain is still in place and pt is waiting to hear when they plan to remove it. Pt discharged to SNF and then returned home. In addition pt underwent radiation therapy for a RUL lung mass/cancer and had his last radiation treatment in September of 2021. He reports that he has continued to walk at home as his primary exercise. Pt was previously seen at this clinic for PT/OT due to weakness/imbalance s/p CVA. Prior  history from 09/21/19: Pt is a 87 year old male who presents with imbalance and difficulty with gait following a right frontal CVA with left hemiparesis on 05/22/19.  He has completed inpatient and home health PT, and now presents for OP PT.  He was discharged from inpatient rehab at a supervision level of assistance, ambulating with a RW.  His family notes that he is impulsive and will try to ambulate without his RW at home.  Family is currently providing 24/7 supervision.    Limitations House hold activities;Walking    How long can you sit comfortably? no limitations    How long can you walk  comfortably? he requires a RW    Patient Stated Goals Pt wants to be able to walk his granddaughter down the aisle at her wedding November 2022    Currently in Pain? No/denies    Pain Onset More than a month ago           Nu-Step x 5 mins x L1   Airex walking tandem beam x 3 laps with mild BUE support of // bars Airex walking side stepping on beam x 3 laps with mild BUE support of // bars Airex x WBOS x throwing darts Airex x NBOS x throwing darts Airex x tandem x throwing darts  All exercises were completed with 3# ankle weights: Seated LAQ x 2 15 reps Seated marches x 2 15 reps  Standing hip abduction x 2 10 reps Standing hamstring curls x 2  10 reps        PT Short Term Goals - 11/13/20 1114      PT SHORT TERM GOAL #1   Title Pt will be independent with HEP in order to improve strength and balance in order to decrease fall risk and improve function at home.    Time 6    Period Weeks    Status On-going    Target Date 11/01/20             PT Long Term Goals - 11/13/20 1114      PT LONG TERM GOAL #1   Title Pt will improve BERG by at least 3 points in order to demonstrate clinically significant improvement in balance.    Baseline 09/20/20: 32/56 (Previous discharge: 03/06/20: 46/56), 12/14: 32/56    Time 12    Period Weeks    Status Not Met    Target Date 12/13/20      PT LONG TERM GOAL #2   Title Pt will decrease TUG to below 14 seconds/decrease in order to demonstrate decreased fall risk.    Baseline 09/20/20: 19.2s, 12/14: 17.48    Time 12    Period Weeks    Status Partially Met    Target Date 12/13/20      PT LONG TERM GOAL #3   Title Pt will decrease 5TSTS by at least 3 seconds in order to demonstrate clinically significant improvement in LE strength.    Baseline 09/20/20: 23.1s with minA+1 for strength and balance (Previous discharge 03/06/20: 16.1s), 12/14: 20 sec    Time 12    Period Weeks    Status Partially Met    Target Date 12/13/20      PT  LONG TERM GOAL #4   Title Pt will increase his self-selected 10MWT to >0.74 m/s in order to demonstrate improvement in gait speed and improved community ambulation    Baseline 09/20/20: self-selected: 14.4s = 0.69 m/s with rollator (Previous discharge 03/06/20: self-selected: 13.6s = 0.74 m/s),  12/14: 0.75 m/s    Time 12    Period Weeks    Status Partially Met    Target Date 12/13/20                 Plan - 12/10/20 1739    Clinical Impression Statement Patient completed strengthening exercises with fair tolerance to activity. He demonstrates increased postural swaying with tandem stance on airex due to decreased proprioception cues. Patient requires frequent verbal and visual cues for proper positioning of knee and hip to complete exercises. He demonstrates improve muscle endurance as evidenced by decreased seated therapeutic rest breaks. Patient would benefit from continued PT services to increase BLE strength, mobility, and balance skills to improve patient's quality of life.    Personal Factors and Comorbidities Age;Comorbidity 3+    Comorbidities HTN, CVA, cholecystitis with cholecystostomy drain    Examination-Activity Limitations Bend;Lift;Squat;Locomotion Level;Stairs;Stand    Examination-Participation Restrictions Community Activity;Driving;Laundry;Meal Prep;Shop    Stability/Clinical Decision Making Unstable/Unpredictable    Rehab Potential Fair    PT Frequency 2x / week    PT Duration 12 weeks    PT Treatment/Interventions ADLs/Self Care Home Management;Canalith Repostioning;Cryotherapy;Electrical Stimulation;Iontophoresis 20m/ml Dexamethasone;Moist Heat;Traction;Ultrasound;DME Instruction;Gait training;Stair training;Therapeutic activities;Therapeutic exercise;Functional mobility training;Balance training;Neuromuscular re-education;Patient/family education;Manual techniques;Dry needling;Vestibular;Joint Manipulations;Spinal Manipulations;Aquatic Therapy    PT Next Visit Plan 2MWT     PT Home Exercise Plan 09/20/20: None currently. Previous episode access code: [email or web address]    Consulted and Agree with Plan of Care Patient    Family Member Consulted granddaughter           Patient will benefit from skilled therapeutic intervention in order to improve the following deficits and impairments:  Abnormal gait,Decreased balance,Decreased mobility,Difficulty walking,Decreased knowledge of precautions,Decreased safety awareness,Decreased activity tolerance,Decreased strength,Decreased endurance  Visit Diagnosis: Muscle weakness (generalized)  Unsteadiness on feet  Other abnormalities of gait and mobility  Decreased strength of lower extremity  Other lack of coordination     Problem List Patient Active Problem List   Diagnosis Date Noted  . Calculus of bile duct without cholecystitis and without obstruction   . Non-small cell carcinoma of lung, right ([place] 09/07/2020  . Mass of upper lobe of right lung 07/13/2020  . Calculus of bile duct with cholecystitis without obstruction   . Abnormal findings on imaging of biliary tract   . Acute cholecystitis 05/02/2020  . Chronic venous insufficiency 12/05/2019  . 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 CKD (chronic kidney disease), stage II   . New onset atrial fibrillation ([place]   . Right middle cerebral artery stroke ([place] 05/27/2019  . Goals of care, counseling/discussion   . Palliative care by specialist   . DNR (do not resuscitate) discussion   . History of CVA (cerebrovascular accident) 05/22/2019  . Persistent proteinuria 07/30/2018  . 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 Chronic kidney disease, stage III (moderate) ([place] 06/15/2018  . Leg pain 05/06/2018  . Lymphedema 05/06/2018  . Coagulopathy ([place] 07/07/2017  . Lumbar spondylosis 03/10/2017  . Elevated uric acid in blood 11/20/2016  . BPH (benign prostatic hyperplasia) 05/29/2016  . Peripheral vascular disease of lower extremity  ([place] 03/03/2016  . Gallstone 12/31/2015  . 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 Atherosclerosis of aorta ([place] 11/22/2015  . Carotid artery narrowing 11/22/2015  . Diverticulosis of colon 11/22/2015  . Decreased creatinine clearance 11/22/2015  . Arthritis, degenerative 11/22/2015  . Lactose intolerance 11/22/2015  . Basal cell carcinoma 11/22/2015  .  Essential hypertension 06/21/2015  . Hyperlipemia 06/21/2015  . GERD (gastroesophageal reflux disease) 06/21/2015  . Calculus of kidney 11/18/2013   Karl Luke PT, DPT Netta Corrigan 12/10/2020, 5:46 PM  [place] MAIN [facility] SERVICES [address] [place], [place], [address] Phone: [phone]   Fax:  [phone]  Name: Jose Castro MRN: [id number] Date of Birth: Nov 03, 1926

## 2020-12-13 ENCOUNTER — Encounter: Payer: Self-pay | Admitting: Physical Therapy

## 2020-12-13 ENCOUNTER — Telehealth: Payer: Self-pay | Admitting: Internal Medicine

## 2020-12-13 ENCOUNTER — Other Ambulatory Visit: Payer: Self-pay

## 2020-12-13 ENCOUNTER — Ambulatory Visit: Payer: Medicare Other | Admitting: Physical Therapy

## 2020-12-13 DIAGNOSIS — R2689 Other abnormalities of gait and mobility: Secondary | ICD-10-CM

## 2020-12-13 DIAGNOSIS — M6281 Muscle weakness (generalized): Secondary | ICD-10-CM | POA: Diagnosis not present

## 2020-12-13 DIAGNOSIS — R2681 Unsteadiness on feet: Secondary | ICD-10-CM

## 2020-12-13 NOTE — Therapy (Signed)
[place] MAIN [facility] SERVICES [address] [place], [place], [address] Phone: [phone]   Fax:  [phone]  Physical Therapy Treatment  Patient Details  Name: Jose Castro MRN: [id number] Date of Birth: 1925/12/12 Referring Provider (PT): Dr. Ancil Boozer   Encounter Date: 12/13/2020   PT End of Session - 12/13/20 1149    Visit Number 14    Number of Visits 25    Date for PT Re-Evaluation 12/13/20    Authorization Type eval: 09/20/20    PT Start Time 1143    PT Stop Time 1230    PT Time Calculation (min) 47 min    Equipment Utilized During Treatment Gait belt    Activity Tolerance Patient tolerated treatment well;Patient limited by fatigue;Patient limited by pain    Behavior During Therapy [facility] for tasks assessed/performed           Past Medical History:  Diagnosis Date  . Allergy    Penicillin  . Arthritis   . Basal cell carcinoma   . BPH (benign prostatic hyperplasia)   . Chronic kidney disease    had a kidney stone which per family was a cyst that was removed  . Dysrhythmia   . GERD (gastroesophageal reflux disease)   . History of kidney stones   . Hyperlipidemia   . Hypertension   . Left-sided low back pain with left-sided sciatica   . Lung mass   . Splenic vein thrombosis   . Stroke ([place]) 05/22/2019  . Thrombosis 12/2015   mural  area and no notation of heart attack    Past Surgical History:  Procedure Laterality Date  . APPENDECTOMY    . CATARACT EXTRACTION, BILATERAL    . CYSTOSCOPY WITH INSERTION OF UROLIFT    . ENDOSCOPIC RETROGRADE CHOLANGIOPANCREATOGRAPHY (ERCP) WITH PROPOFOL N/A 05/11/2020   Procedure: ENDOSCOPIC RETROGRADE CHOLANGIOPANCREATOGRAPHY (ERCP) WITH PROPOFOL;  Surgeon: [name], MD;  Location: [facility] ENDOSCOPY;  Service: Endoscopy;  Laterality: N/A;  . ERCP N/A 10/23/2020   Procedure: ENDOSCOPIC RETROGRADE CHOLANGIOPANCREATOGRAPHY (ERCP);  Surgeon: [name], MD;  Location: [facility] ENDOSCOPY;   Service: Endoscopy;  Laterality: N/A;  . EYE SURGERY  08/2018  . IR EXCHANGE BILIARY DRAIN  05/04/2020  . KIDNEY STONE SURGERY    . KNEE SURGERY    . PACEMAKER INSERTION N/A 07/13/2018   Procedure: INSERTION PACEMAKER-DUEL CHAMBER INITIAL IMPLANT;  Surgeon: Isaias Cowman, MD;  Location: [facility] ORS;  Service: Cardiovascular;  Laterality: N/A;  . SPINE SURGERY      There were no vitals filed for this visit.   Subjective Assessment - 12/13/20 1147    Subjective Patient reports doing well. he reports his knee continues to bother him. He denies any new falls. Patient presents to therapy with RW. He reports doing HEP about 3x in the last week;    Patient is accompained by: Family member    Pertinent History Pt referred for deconditioning and gait instability. He has had a decline since he last finished therapy. Pt had a hospital admission from 05/02/20 to 05/14/20 for acute cholecystitis and is now s/p cholecystostomy drain placement. Drain is still in place and pt is waiting to hear when they plan to remove it. Pt discharged to SNF and then returned home. In addition pt underwent radiation therapy for a RUL lung mass/cancer and had his last radiation treatment in September of 2021. He reports that he has continued to walk at home as his primary exercise. Pt was previously seen at this clinic  for PT/OT due to weakness/imbalance s/p CVA. Prior history from 09/21/19: Pt is a 87 year old male who presents with imbalance and difficulty with gait following a right frontal CVA with left hemiparesis on 05/22/19.  He has completed inpatient and home health PT, and now presents for OP PT.  He was discharged from inpatient rehab at a supervision level of assistance, ambulating with a RW.  His family notes that he is impulsive and will try to ambulate without his RW at home.  Family is currently providing 24/7 supervision.    Limitations House hold activities;Walking    How long can you sit comfortably? no limitations     How long can you walk comfortably? he requires a RW    Patient Stated Goals Pt wants to be able to walk his granddaughter down the aisle at her wedding November 2022    Currently in Pain? Yes    Pain Score 7     Pain Location Knee    Pain Orientation Left    Pain Descriptors / Indicators Aching;Sore    Pain Type Chronic pain    Pain Onset More than a month ago    Pain Frequency Intermittent    Aggravating Factors  worse with weight bearing/bending knee    Pain Relieving Factors rest    Effect of Pain on Daily Activities decreased activity tolerance;    Multiple Pain Sites No              [facility] PT Assessment - 12/13/20 0001      Berg Balance Test   Sit to Stand Able to stand  independently using hands    Standing Unsupported Able to stand safely 2 minutes    Sitting with Back Unsupported but Feet Supported on Floor or Stool Able to sit safely and securely 2 minutes    Stand to Sit Controls descent by using hands    Transfers Able to transfer safely, definite need of hands    Standing Unsupported with Eyes Closed Able to stand 3 seconds    Standing Unsupported with Feet Together Able to place feet together independently and stand for 1 minute with supervision    From Standing, Reach Forward with Outstretched Arm Can reach forward >12 cm safely (5")    From Standing Position, Pick up Object from Floor Unable to pick up shoe, but reaches 2-5 cm (1-2") from shoe and balances independently    From Standing Position, Turn to Look Behind Over each Shoulder Turn sideways only but maintains balance    Turn 360 Degrees Needs close supervision or verbal cueing    Standing Unsupported, Alternately Place Feet on Step/Stool Able to complete >2 steps/needs minimal assist    Standing Unsupported, One Foot in [employer] balance while stepping or standing    Standing on One Leg Unable to try or needs assist to prevent fall    Total Score 31    Berg comment: high fall risk, more impaired than  11/13/20 which was 32/56         TREATMENT: Warm up on Nustep BUE/BLE level 2 x4 min (Unbilled)  FUNCTIONAL OUTCOME MEASURES     Results (09/20/20) Results 11/13/20 Results 12/13/20 Comments  BERG 32/56 32/56 31/56  Fall risk, in need of intervention  TUG 19.2seconds 17.48 sec with RW 16.65 sec with RW Fall risk, in need of intervention  5TSTS 23.1seconds 20 sec with hands pushing on thighs 22 sec with hands pushing on chair Pt requiring minA+1 to complete test; >15 sec  indicates high fall risk  6 Minute Walk Test Deferred   Deferred  10 Meter Gait Speed Self-selected:14.4s =0.71ms; Fastest:10.7s =0.957m Self selected: 13.4 sec (0.75 m/s) Fast: 11.42 sec (0.87 m/s) Fastest: 12.76 sec (0.78 m/s) with RW Below normative values (<1.0 m/s) for full community ambulation  FOTO 83 74.24%  73% Predicted decline to 79       Instructed patient in outcome measures, see above;  Instructed patient in balance exercise: Standing on airex: -alternate toe taps on 6 inch step with 2-1-0 rail assist x15 reps each LE with min A for safety; -standing one foot on airex, one foot on 6 inch step: BUE ball pass side/side x10 reps each foot on step with min A for safety; Patient required min VCs for balance stability, including to increase trunk control for less loss of balance with smaller base of support                      PT Education - 12/13/20 1149    Education Details progress towards goals/recommendation;    Person(s) Educated Patient    Methods Explanation;Verbal cues    Comprehension Verbalized understanding;Returned demonstration;Verbal cues required;Need further instruction            PT Short Term Goals - 12/13/20 1210      PT SHORT TERM GOAL #1   Title Pt will be independent with HEP in order to improve strength and balance in order to decrease fall risk and improve function at home.    Time 6    Period Weeks    Status On-going    Target Date  01/10/21             PT Long Term Goals - 12/13/20 1210      PT LONG TERM GOAL #1   Title Pt will improve BERG by at least 3 points in order to demonstrate clinically significant improvement in balance.    Baseline 09/20/20: 32/56 (Previous discharge: 03/06/20: 46/56), 12/14: 32/56, 1/13: 31/56    Time 4    Period Weeks    Status Not Met    Target Date 01/10/21      PT LONG TERM GOAL #2   Title Pt will decrease TUG to below 14 seconds/decrease in order to demonstrate decreased fall risk.    Baseline 09/20/20: 19.2s, 12/14: 17.48, 1/13: 16.65 sec    Time 4    Period Weeks    Status Partially Met    Target Date 01/10/21      PT LONG TERM GOAL #3   Title Pt will decrease 5TSTS by at least 3 seconds in order to demonstrate clinically significant improvement in LE strength.    Baseline 09/20/20: 23.1s with minA+1 for strength and balance (Previous discharge 03/06/20: 16.1s), 12/14: 20 sec, 1/13: 22 sec    Time 4    Period Weeks    Status Partially Met    Target Date 01/10/21      PT LONG TERM GOAL #4   Title Pt will increase his self-selected 10MWT to >0.74 m/s in order to demonstrate improvement in gait speed and improved community ambulation    Baseline 09/20/20: self-selected: 14.4s = 0.69 m/s with rollator (Previous discharge 03/06/20: self-selected: 13.6s = 0.74 m/s), 12/14: 0.75 m/s, 1/13: 0.78 m/s    Time 4    Period Weeks    Status Partially Met    Target Date 01/10/21  Plan - 12/13/20 1430    Clinical Impression Statement Patient motivated and participated fair within session. instructed patient in outcome measures to address progress towards goals. Patient does exhibit slight improvement in timed up and go. However exhibits no change in sit<>stand and 10 meter walk. Patient has missed multiple therapy visits as a result of increased foot pain and holidays. Patient does have chronic OA in left knee which does limit his mobility. He has been given a HEP  which he reports he is adherent to. However his family doubts his adherence. Recommend patient continue with skilled PT intervention to improve LE strength, balance and mobility. Patient expressed that he would increase attendance this next bout of therapy. Educated patient that if he continues to miss multiple appointments and/or does not make progress at next assessment then therapy would be discontinued. Patient and family agreeable.    Personal Factors and Comorbidities Age;Comorbidity 3+    Comorbidities HTN, CVA, cholecystitis with cholecystostomy drain    Examination-Activity Limitations Bend;Lift;Squat;Locomotion Level;Stairs;Stand    Examination-Participation Restrictions Community Activity;Driving;Laundry;Meal Prep;Shop    Stability/Clinical Decision Making Unstable/Unpredictable    Rehab Potential Fair    PT Frequency 2x / week    PT Duration 4 weeks    PT Treatment/Interventions ADLs/Self Care Home Management;Canalith Repostioning;Cryotherapy;Electrical Stimulation;Iontophoresis 4mg /ml Dexamethasone;Moist Heat;Traction;Ultrasound;DME Instruction;Gait training;Stair training;Therapeutic activities;Therapeutic exercise;Functional mobility training;Balance training;Neuromuscular re-education;Patient/family education;Manual techniques;Dry needling;Vestibular;Joint Manipulations;Spinal Manipulations;Aquatic Therapy    PT Next Visit Plan work on LE strengthening and balance    PT Home Exercise Plan advance as tolerated;    Consulted and Agree with Plan of Care Patient    Family Member Consulted granddaughter           Patient will benefit from skilled therapeutic intervention in order to improve the following deficits and impairments:  Abnormal gait,Decreased balance,Decreased mobility,Difficulty walking,Decreased knowledge of precautions,Decreased safety awareness,Decreased activity tolerance,Decreased strength,Decreased endurance  Visit Diagnosis: Muscle weakness  (generalized)  Unsteadiness on feet  Other abnormalities of gait and mobility     Problem List Patient Active Problem List   Diagnosis Date Noted  . Calculus of bile duct without cholecystitis and without obstruction   . Non-small cell carcinoma of lung, right ([place]) 09/07/2020  . Mass of upper lobe of right lung 07/13/2020  . Calculus of bile duct with cholecystitis without obstruction   . Abnormal findings on imaging of biliary tract   . Acute cholecystitis 05/02/2020  . Chronic venous insufficiency 12/05/2019  . 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 CKD (chronic kidney disease), stage II   . New onset atrial fibrillation ([place])   . Right middle cerebral artery stroke ([place]) 05/27/2019  . Goals of care, counseling/discussion   . Palliative care by specialist   . DNR (do not resuscitate) discussion   . History of CVA (cerebrovascular accident) 05/22/2019  . Persistent proteinuria 07/30/2018  . 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 Chronic kidney disease, stage III (moderate) ([place]) 06/15/2018  . Leg pain 05/06/2018  . Lymphedema 05/06/2018  . Coagulopathy ([place]) 07/07/2017  . Lumbar spondylosis 03/10/2017  . Elevated uric acid in blood 11/20/2016  . BPH (benign prostatic hyperplasia) 05/29/2016  . Peripheral vascular disease of lower extremity ([place]) 03/03/2016  . Gallstone 12/31/2015  . 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 Atherosclerosis of aorta ([place]) 11/22/2015  . Carotid artery narrowing 11/22/2015  . Diverticulosis of colon 11/22/2015  . Decreased creatinine clearance 11/22/2015  . Arthritis, degenerative 11/22/2015  .  Lactose intolerance 11/22/2015  . Basal cell carcinoma 11/22/2015  . Essential hypertension 06/21/2015  . Hyperlipemia 06/21/2015  . GERD (gastroesophageal reflux disease) 06/21/2015  . Calculus of kidney 11/18/2013    Jose Castro PT, DPT 12/13/2020, 2:37 PM  [place] MAIN [facility] SERVICES [address] [place], [place], [address] Phone: [phone]   Fax:  [phone]  Name: Jose Castro MRN: [id number] Date of Birth: 24-Oct-1926

## 2020-12-13 NOTE — Patient Instructions (Signed)
FUNCTIONAL OUTCOME MEASURES     Results (09/20/20) Results 11/13/20 Results 12/13/20 Comments  BERG 32/56 32/56 31/56  Fall risk, in need of intervention  TUG 19.2seconds 17.48 sec with RW 16.65 sec with RW Fall risk, in need of intervention  5TSTS 23.1seconds 20 sec with hands pushing on thighs 22 sec with hands pushing on chair Pt requiring minA+1 to complete test; >15 sec indicates high fall risk  6 Minute Walk Test Deferred   Deferred  10 Meter Gait Speed Self-selected:14.4s =0.59m/s; Fastest:10.7s =0.74m/s Self selected: 13.4 sec (0.75 m/s) Fast: 11.42 sec (0.87 m/s) Fastest: 12.76 sec (0.78 m/s) with RW Below normative values (<1.0 m/s) for full community ambulation  FOTO 83 74.24%  73% Predicted decline to 79

## 2020-12-13 NOTE — Telephone Encounter (Signed)
12/13/2020 pts daughter feels he no longer needs to see dr. B, does not want to r/s  srw

## 2020-12-17 ENCOUNTER — Ambulatory Visit: Admission: RE | Admit: 2020-12-17 | Payer: Medicare Other | Source: Ambulatory Visit

## 2020-12-17 ENCOUNTER — Inpatient Hospital Stay: Payer: Medicare Other

## 2020-12-18 ENCOUNTER — Ambulatory Visit: Payer: Medicare Other

## 2020-12-18 ENCOUNTER — Other Ambulatory Visit: Payer: Self-pay

## 2020-12-18 DIAGNOSIS — R2681 Unsteadiness on feet: Secondary | ICD-10-CM

## 2020-12-18 DIAGNOSIS — I63511 Cerebral infarction due to unspecified occlusion or stenosis of right middle cerebral artery: Secondary | ICD-10-CM

## 2020-12-18 DIAGNOSIS — R278 Other lack of coordination: Secondary | ICD-10-CM

## 2020-12-18 DIAGNOSIS — R2689 Other abnormalities of gait and mobility: Secondary | ICD-10-CM

## 2020-12-18 DIAGNOSIS — M6281 Muscle weakness (generalized): Secondary | ICD-10-CM

## 2020-12-18 DIAGNOSIS — M79674 Pain in right toe(s): Secondary | ICD-10-CM

## 2020-12-18 DIAGNOSIS — R29898 Other symptoms and signs involving the musculoskeletal system: Secondary | ICD-10-CM

## 2020-12-18 DIAGNOSIS — R5383 Other fatigue: Secondary | ICD-10-CM

## 2020-12-18 NOTE — Therapy (Signed)
[place] MAIN [facility] SERVICES [address] [place], [place], [address] Phone: [phone]   Fax:  [phone]  Physical Therapy Treatment  Patient Details  Name: Jose Castro MRN: [id number] Date of Birth: [birth year] Referring Provider (PT): Dr. Ancil Boozer   Encounter Date: 12/18/2020   PT End of Session - 12/18/20 1259    Visit Number 15    Number of Visits 25    Date for PT Re-Evaluation 12/13/20    Authorization Type eval: 09/20/20    PT Start Time 1301    PT Stop Time 1347    PT Time Calculation (min) 46 min    Equipment Utilized During Treatment Gait belt    Activity Tolerance Patient tolerated treatment well;Patient limited by fatigue;Patient limited by pain    Behavior During Therapy [facility] for tasks assessed/performed           Past Medical History:  Diagnosis Date  . Allergy    Penicillin  . Arthritis   . Basal cell carcinoma   . BPH (benign prostatic hyperplasia)   . Chronic kidney disease    had a kidney stone which per family was a cyst that was removed  . Dysrhythmia   . GERD (gastroesophageal reflux disease)   . History of kidney stones   . Hyperlipidemia   . Hypertension   . Left-sided low back pain with left-sided sciatica   . Lung mass   . Splenic vein thrombosis   . Stroke ([place]) 05/22/2019  . Thrombosis 12/2015   mural  area and no notation of heart attack    Past Surgical History:  Procedure Laterality Date  . APPENDECTOMY    . CATARACT EXTRACTION, BILATERAL    . CYSTOSCOPY WITH INSERTION OF UROLIFT    . ENDOSCOPIC RETROGRADE CHOLANGIOPANCREATOGRAPHY (ERCP) WITH PROPOFOL N/A 05/11/2020   Procedure: ENDOSCOPIC RETROGRADE CHOLANGIOPANCREATOGRAPHY (ERCP) WITH PROPOFOL;  Surgeon: [name], MD;  Location: [facility] ENDOSCOPY;  Service: Endoscopy;  Laterality: N/A;  . ERCP N/A 10/23/2020   Procedure: ENDOSCOPIC RETROGRADE CHOLANGIOPANCREATOGRAPHY (ERCP);  Surgeon: [name], MD;  Location: [facility] ENDOSCOPY;   Service: Endoscopy;  Laterality: N/A;  . EYE SURGERY  08/2018  . IR EXCHANGE BILIARY DRAIN  05/04/2020  . KIDNEY STONE SURGERY    . KNEE SURGERY    . PACEMAKER INSERTION N/A 07/13/2018   Procedure: INSERTION PACEMAKER-DUEL CHAMBER INITIAL IMPLANT;  Surgeon: Isaias Cowman, MD;  Location: [facility] ORS;  Service: Cardiovascular;  Laterality: N/A;  . SPINE SURGERY      There were no vitals filed for this visit.   Subjective Assessment - 12/18/20 1258    Subjective Patient reports doing well. he reports no changes with his knee- still bothers me ofter. He denies any new falls. Patient presents to therapy with RW. States he is moving around better.    Patient is accompained by: Family member    Pertinent History Pt referred for deconditioning and gait instability. He has had a decline since he last finished therapy. Pt had a hospital admission from 05/02/20 to 05/14/20 for acute cholecystitis and is now s/p cholecystostomy drain placement. Drain is still in place and pt is waiting to hear when they plan to remove it. Pt discharged to SNF and then returned home. In addition pt underwent radiation therapy for a RUL lung mass/cancer and had his last radiation treatment in September of 2021. He reports that he has continued to walk at home as his primary exercise. Pt was previously seen at this clinic for  PT/OT due to weakness/imbalance s/p CVA. Prior history from 09/21/19: Pt is a 87 year old male who presents with imbalance and difficulty with gait following a right frontal CVA with left hemiparesis on 05/22/19.  He has completed inpatient and home health PT, and now presents for OP PT.  He was discharged from inpatient rehab at a supervision level of assistance, ambulating with a RW.  His family notes that he is impulsive and will try to ambulate without his RW at home.  Family is currently providing 24/7 supervision.    Limitations House hold activities;Walking    How long can you sit comfortably? no  limitations    How long can you walk comfortably? he requires a RW    Patient Stated Goals Pt wants to be able to walk his granddaughter down the aisle at her wedding November 2022    Currently in Pain? Yes    Pain Score 2     Pain Location Knee    Pain Orientation Left    Pain Descriptors / Indicators Aching    Pain Type Chronic pain    Pain Onset More than a month ago    Pain Frequency Intermittent    Aggravating Factors  prolonged standing/walking    Pain Relieving Factors rest          Therex   All exercises were completed with 3# ankle weights: Seated LAQ bilateral 15 reps Seated marches bilateral 15 reps  Standing hip abduction bilateral x 12 reps Sit to stand  X 10 reps from straight back chair with hands on knees x reps and verbal cues to scoot out to edge of seat and for forward trunk posture.   Forward step up onto 1st step (at stairs with min bilateral UE support on railing) x 8  Side step up on right LE with min Bilateral UE suport x 8 reps    Neuro re-ed:  Side stepping in Parallel bars - fingertip bilateral touch only- focusing on counting how many steps from one end of bars to other- x 2 trials: leading with left 8 steps and leading with right LE = 7 reps each. VC for hand position and for keeping feet pointed forward.    Retro/forward gait with fingertip touch in bars- 3 trials forward then back verbal cues to increase step length both ways. Patient reported and demonstrated increased difficulty walking backward.    Static standing on purple foam with feet normal width and no UE Support x 10 sec x 3 trials- mild unsteadiness Progressed to staggered stance using purple foam (for back leg) and green theraband pad (for forward leg) - hold 20 sec x 2 trials each.   Patient required verbal cues and visual demo for correct technique and for safety with balance. He required constant CGA throughout session and use of gait  belt.                         PT Education - 12/18/20 1810    Education Details Verbal cues for exercise techniques today and gait sequencing.    Person(s) Educated Patient    Methods Explanation;Demonstration;Verbal cues    Comprehension Verbalized understanding;Returned demonstration;Verbal cues required;Need further instruction            PT Short Term Goals - 12/13/20 1210      PT SHORT TERM GOAL #1   Title Pt will be independent with HEP in order to improve strength and balance in order to decrease  fall risk and improve function at home.    Time 6    Period Weeks    Status On-going    Target Date 01/10/21             PT Long Term Goals - 12/13/20 1210      PT LONG TERM GOAL #1   Title Pt will improve BERG by at least 3 points in order to demonstrate clinically significant improvement in balance.    Baseline 09/20/20: 32/56 (Previous discharge: 03/06/20: 46/56), 12/14: 32/56, 1/13: 31/56    Time 4    Period Weeks    Status Not Met    Target Date 01/10/21      PT LONG TERM GOAL #2   Title Pt will decrease TUG to below 14 seconds/decrease in order to demonstrate decreased fall risk.    Baseline 09/20/20: 19.2s, 12/14: 17.48, 1/13: 16.65 sec    Time 4    Period Weeks    Status Partially Met    Target Date 01/10/21      PT LONG TERM GOAL #3   Title Pt will decrease 5TSTS by at least 3 seconds in order to demonstrate clinically significant improvement in LE strength.    Baseline 09/20/20: 23.1s with minA+1 for strength and balance (Previous discharge 03/06/20: 16.1s), 12/14: 20 sec, 1/13: 22 sec    Time 4    Period Weeks    Status Partially Met    Target Date 01/10/21      PT LONG TERM GOAL #4   Title Pt will increase his self-selected 10MWT to >0.74 m/s in order to demonstrate improvement in gait speed and improved community ambulation    Baseline 09/20/20: self-selected: 14.4s = 0.69 m/s with rollator (Previous discharge 03/06/20: self-selected:  13.6s = 0.74 m/s), 12/14: 0.75 m/s, 1/13: 0.78 m/s    Time 4    Period Weeks    Status Partially Met    Target Date 01/10/21                 Plan - 12/18/20 1817    Clinical Impression Statement Patient challenged today with additional new balance exercises requiring verbal cues and visual demo to perform correctly. Patient demonstrated improvement with practice and reported fatigue as limiting factor upon completion of session today. He will benefit from continued PT services to continue to progress his balance, strengthening, and gait for improved safety and independence with all mobility.    Personal Factors and Comorbidities Age;Comorbidity 3+    Comorbidities HTN, CVA, cholecystitis with cholecystostomy drain    Examination-Activity Limitations Bend;Lift;Squat;Locomotion Level;Stairs;Stand    Examination-Participation Restrictions Community Activity;Driving;Laundry;Meal Prep;Shop    Stability/Clinical Decision Making Unstable/Unpredictable    Rehab Potential Fair    PT Frequency 2x / week    PT Duration 4 weeks    PT Treatment/Interventions ADLs/Self Care Home Management;Canalith Repostioning;Cryotherapy;Electrical Stimulation;Iontophoresis 61m/ml Dexamethasone;Moist Heat;Traction;Ultrasound;DME Instruction;Gait training;Stair training;Therapeutic activities;Therapeutic exercise;Functional mobility training;Balance training;Neuromuscular re-education;Patient/family education;Manual techniques;Dry needling;Vestibular;Joint Manipulations;Spinal Manipulations;Aquatic Therapy    PT Next Visit Plan work on LE strengthening and balance    PT Home Exercise Plan advance as tolerated;    Consulted and Agree with Plan of Care Patient    Family Member Consulted granddaughter           Patient will benefit from skilled therapeutic intervention in order to improve the following deficits and impairments:  Abnormal gait,Decreased balance,Decreased mobility,Difficulty walking,Decreased  knowledge of precautions,Decreased safety awareness,Decreased activity tolerance,Decreased strength,Decreased endurance  Visit Diagnosis: Muscle weakness (generalized)  Unsteadiness on  feet  Decreased strength of lower extremity  Other lack of coordination  Other abnormalities of gait and mobility  Great toe pain, right  Right middle cerebral artery stroke [facility])  Lethargy     Problem List Patient Active Problem List   Diagnosis Date Noted  . Calculus of bile duct without cholecystitis and without obstruction   . Non-small cell carcinoma of lung, right ([place]) 09/07/2020  . Mass of upper lobe of right lung 07/13/2020  . Calculus of bile duct with cholecystitis without obstruction   . Abnormal findings on imaging of biliary tract   . Acute cholecystitis 05/02/2020  . Chronic venous insufficiency 12/05/2019  . 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 CKD (chronic kidney disease), stage II   . New onset atrial fibrillation ([place])   . 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 Goals of care, counseling/discussion   . Palliative care by specialist   . DNR (do not resuscitate) discussion   . History of CVA (cerebrovascular accident) 05/22/2019  . Persistent proteinuria 07/30/2018  . 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 Chronic kidney disease, stage III (moderate) ([place]) 06/15/2018  . Leg pain 05/06/2018  . Lymphedema 05/06/2018  . Coagulopathy ([place]) 07/07/2017  . Lumbar spondylosis 03/10/2017  . Elevated uric acid in blood 11/20/2016  . BPH (benign prostatic hyperplasia) 05/29/2016  . Peripheral vascular disease of lower extremity ([place]) 03/03/2016  . Gallstone 12/31/2015  . 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 Atherosclerosis of aorta ([place]) 11/22/2015  . Carotid artery narrowing 11/22/2015  . Diverticulosis of colon 11/22/2015  . Decreased creatinine clearance 11/22/2015  . Arthritis, degenerative  11/22/2015  . Lactose intolerance 11/22/2015  . Basal cell carcinoma 11/22/2015  . Essential hypertension 06/21/2015  . Hyperlipemia 06/21/2015  . GERD (gastroesophageal reflux disease) 06/21/2015  . Calculus of kidney 11/18/2013    Lewis Moccasin, PT 12/18/2020, 6:23 PM  [place] MAIN [facility] SERVICES [address] [place], [place], [address] Phone: [phone]   Fax:  [phone]  Name: Jose Castro MRN: [id number] Date of Birth: 1926/10/11

## 2020-12-19 ENCOUNTER — Other Ambulatory Visit: Payer: Self-pay

## 2020-12-19 ENCOUNTER — Ambulatory Visit
Admission: RE | Admit: 2020-12-19 | Discharge: 2020-12-19 | Disposition: A | Discharge status: Home / Self Care | Payer: Medicare Other | Source: Ambulatory Visit | Attending: Internal Medicine | Admitting: Internal Medicine

## 2020-12-19 ENCOUNTER — Ambulatory Visit: Payer: Medicare Other | Admitting: Internal Medicine

## 2020-12-19 DIAGNOSIS — R918 Other nonspecific abnormal finding of lung field: Secondary | ICD-10-CM | POA: Insufficient documentation

## 2020-12-19 LAB — POCT I-STAT CREATININE: Creatinine, Ser: 1.2 mg/dL (ref 0.61–1.24)

## 2020-12-19 MED ORDER — IOHEXOL 300 MG/ML  SOLN
75.0000 mL | Freq: Once | INTRAMUSCULAR | Status: AC | PRN
  Administered 2020-12-19: 75 mL via INTRAVENOUS

## 2020-12-20 ENCOUNTER — Inpatient Hospital Stay: Payer: Medicare Other

## 2020-12-20 ENCOUNTER — Inpatient Hospital Stay: Payer: Medicare Other | Attending: Internal Medicine

## 2020-12-20 ENCOUNTER — Ambulatory Visit: Payer: Medicare Other | Admitting: Physical Therapy

## 2020-12-20 ENCOUNTER — Encounter: Payer: Self-pay | Admitting: Family Medicine

## 2020-12-20 DIAGNOSIS — C3411 Malignant neoplasm of upper lobe, right bronchus or lung: Secondary | ICD-10-CM | POA: Insufficient documentation

## 2020-12-20 DIAGNOSIS — M6281 Muscle weakness (generalized): Secondary | ICD-10-CM

## 2020-12-20 DIAGNOSIS — I63511 Cerebral infarction due to unspecified occlusion or stenosis of right middle cerebral artery: Secondary | ICD-10-CM

## 2020-12-20 DIAGNOSIS — R29898 Other symptoms and signs involving the musculoskeletal system: Secondary | ICD-10-CM

## 2020-12-20 DIAGNOSIS — R2681 Unsteadiness on feet: Secondary | ICD-10-CM

## 2020-12-20 DIAGNOSIS — R278 Other lack of coordination: Secondary | ICD-10-CM

## 2020-12-20 DIAGNOSIS — R2689 Other abnormalities of gait and mobility: Secondary | ICD-10-CM

## 2020-12-20 DIAGNOSIS — R918 Other nonspecific abnormal finding of lung field: Secondary | ICD-10-CM

## 2020-12-20 LAB — CBC WITH DIFFERENTIAL/PLATELET
Abs Immature Granulocytes: 0.02 10*3/uL (ref 0.00–0.07)
Basophils Absolute: 0.1 10*3/uL (ref 0.0–0.1)
Basophils Relative: 1 %
Eosinophils Absolute: 0.2 10*3/uL (ref 0.0–0.5)
Eosinophils Relative: 3 %
HCT: 39.2 % (ref 39.0–52.0)
Hemoglobin: 13 g/dL (ref 13.0–17.0)
Immature Granulocytes: 0 %
Lymphocytes Relative: 22 %
Lymphs Abs: 1.9 10*3/uL (ref 0.7–4.0)
MCH: 31.5 pg (ref 26.0–34.0)
MCHC: 33.2 g/dL (ref 30.0–36.0)
MCV: 94.9 fL (ref 80.0–100.0)
Monocytes Absolute: 1 10*3/uL (ref 0.1–1.0)
Monocytes Relative: 12 %
Neutro Abs: 5.2 10*3/uL (ref 1.7–7.7)
Neutrophils Relative %: 62 %
Platelets: 232 10*3/uL (ref 150–400)
RBC: 4.13 MIL/uL — ABNORMAL LOW (ref 4.22–5.81)
RDW: 14.6 % (ref 11.5–15.5)
WBC: 8.4 10*3/uL (ref 4.0–10.5)
nRBC: 0 % (ref 0.0–0.2)

## 2020-12-20 LAB — COMPREHENSIVE METABOLIC PANEL
ALT: 9 U/L (ref 0–44)
AST: 15 U/L (ref 15–41)
Albumin: 3.4 g/dL — ABNORMAL LOW (ref 3.5–5.0)
Alkaline Phosphatase: 55 U/L (ref 38–126)
Anion gap: 9 (ref 5–15)
BUN: 21 mg/dL (ref 8–23)
CO2: 28 mmol/L (ref 22–32)
Calcium: 8.8 mg/dL — ABNORMAL LOW (ref 8.9–10.3)
Chloride: 98 mmol/L (ref 98–111)
Creatinine, Ser: 1.25 mg/dL — ABNORMAL HIGH (ref 0.61–1.24)
GFR, Estimated: 54 mL/min — ABNORMAL LOW (ref >60)
Glucose, Bld: 87 mg/dL (ref 70–99)
Potassium: 4 mmol/L (ref 3.5–5.1)
Sodium: 135 mmol/L (ref 135–145)
Total Bilirubin: 0.9 mg/dL (ref 0.3–1.2)
Total Protein: 6.3 g/dL — ABNORMAL LOW (ref 6.5–8.1)

## 2020-12-20 IMAGING — CT CT ABD-PELV W/ CM
2 of 5 series · 14 of 46 positions shown, 16 images · IV contrast (APPLIED)
Comparison: May 02, 2020 abdomen pelvis CT, chest CT December 14, 2015

CLINICAL DATA: Chest pain and abdominal pain

EXAM:
CT ANGIOGRAPHY CHEST WITH CONTRAST
TECHNIQUE: Multidetector CT imaging of the chest was performed using the
standard protocol during bolus administration of intravenous
contrast. Multiplanar CT image reconstructions and MIPs were
obtained to evaluate the vascular anatomy.
CONTRAST:  75mL OMNIPAQUE IOHEXOL 350 MG/ML SOLN

[Series 2: axial st · axial · 0.78mm/px · z∈[-1240,-755]mm · 11 of 109 slices shown, 13 images]
[im 6/109  soft-tissue]
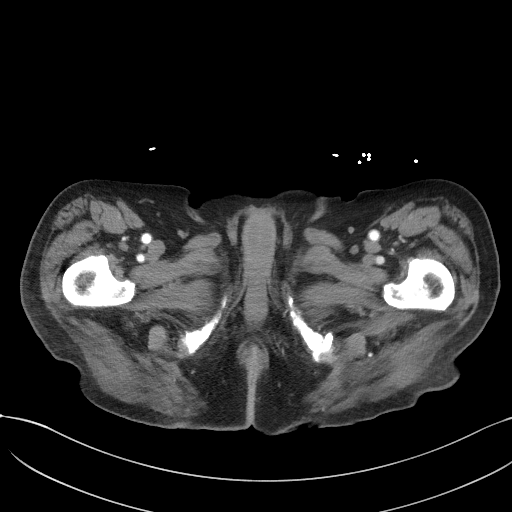
[im 6/109  bone]
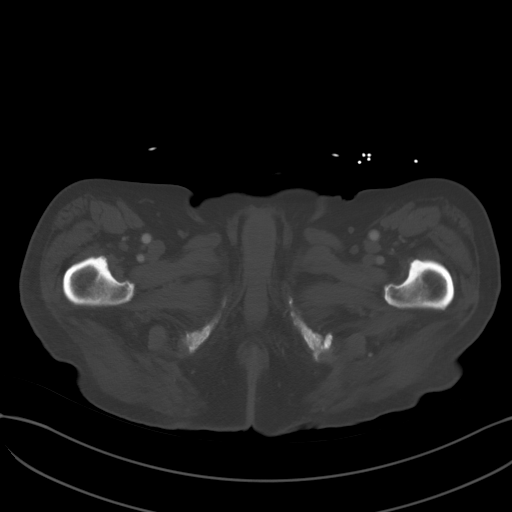
[im 18/109  soft-tissue]
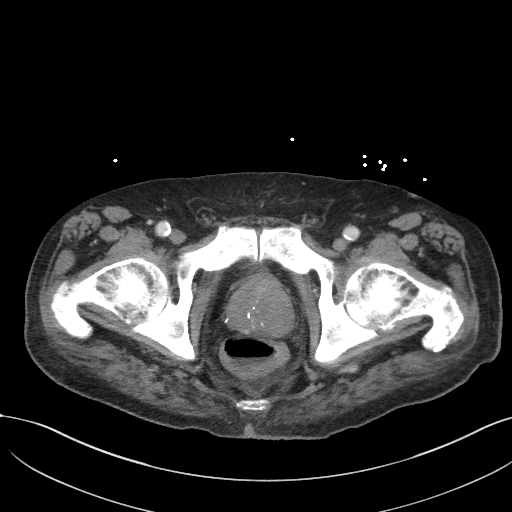
[im 29/109  soft-tissue]
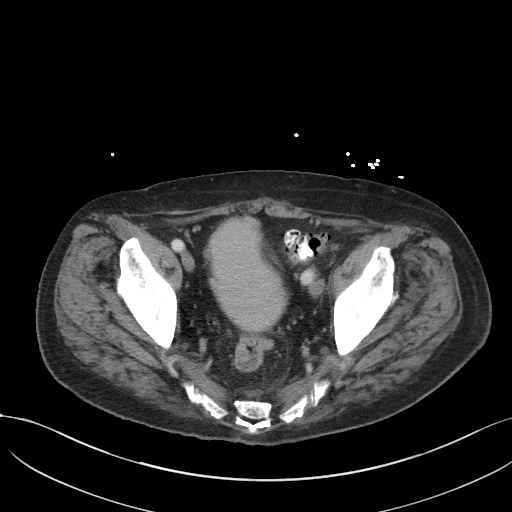
[im 35/109  soft-tissue]
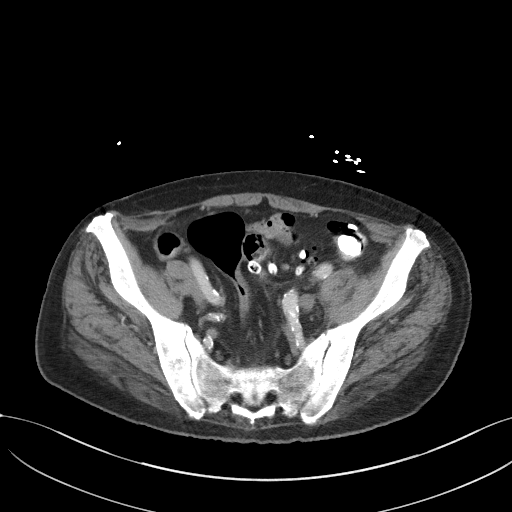
[im 46/109  soft-tissue]
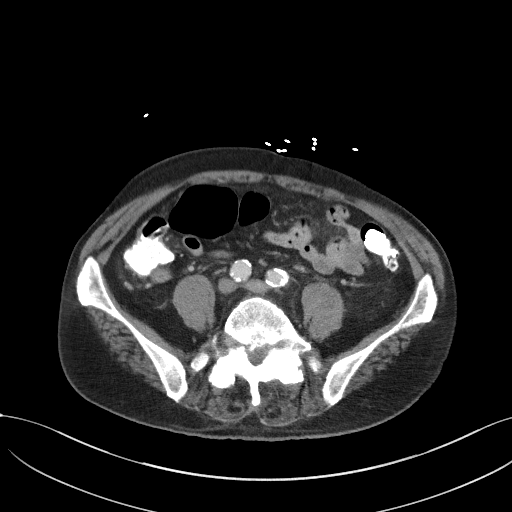
[im 57/109  soft-tissue]
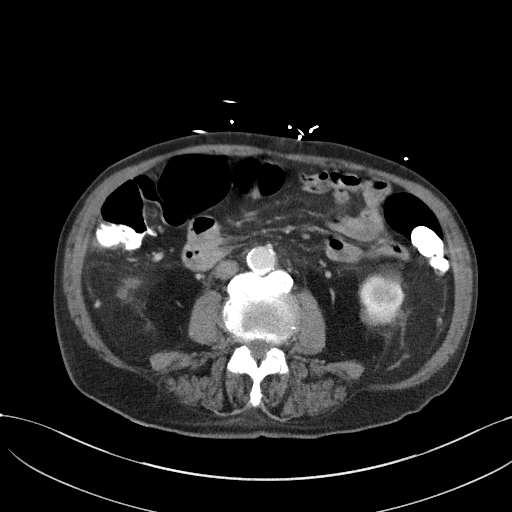
[im 63/109  soft-tissue]
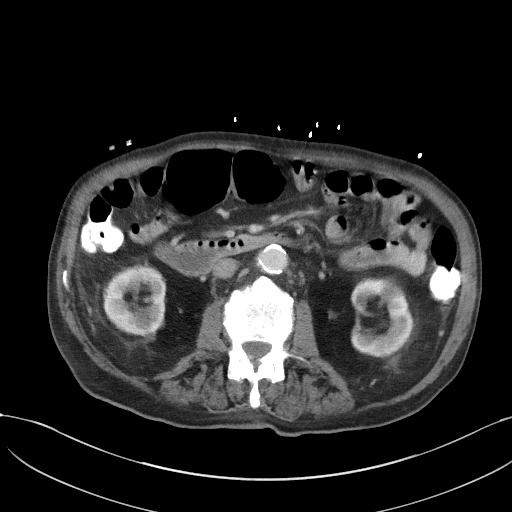
[im 74/109  soft-tissue]
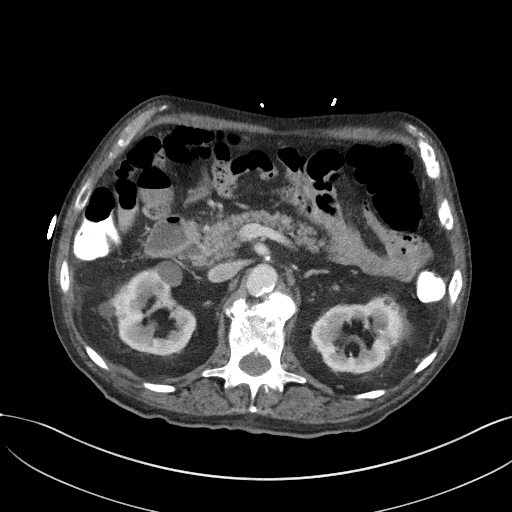
[im 80/109  soft-tissue]
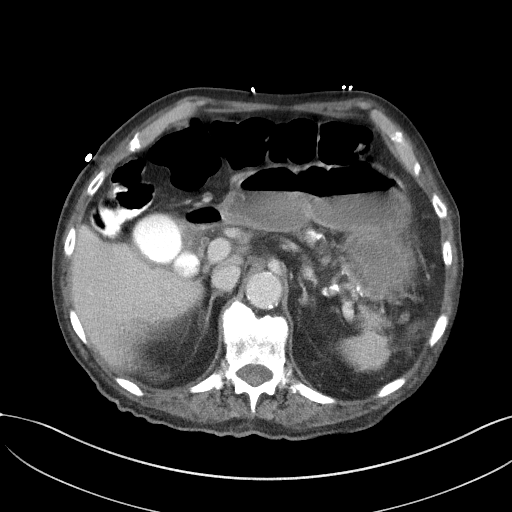
[im 80/109  bone]
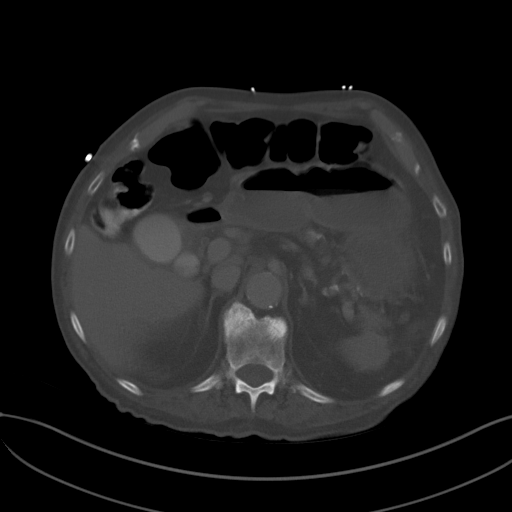
[im 91/109  soft-tissue]
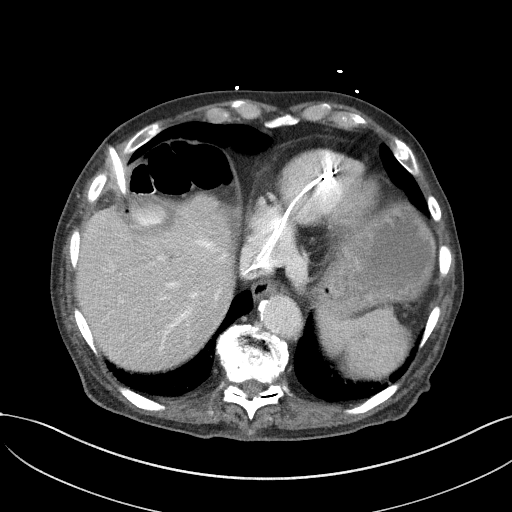
[im 103/109  soft-tissue]
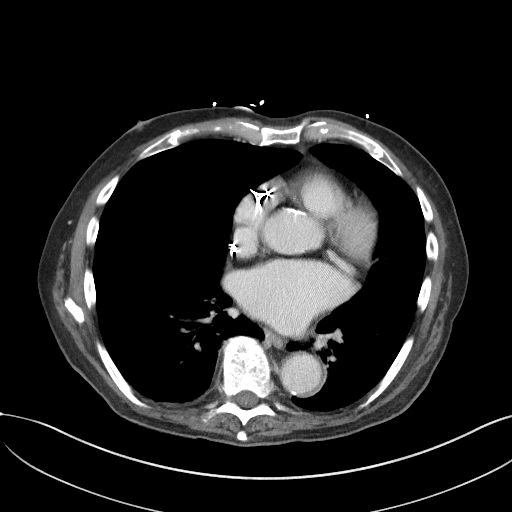

[Series 5: coronal st · coronal · 0.74mm/px · 3 of 96 slices shown]
[im 32/96  soft-tissue]
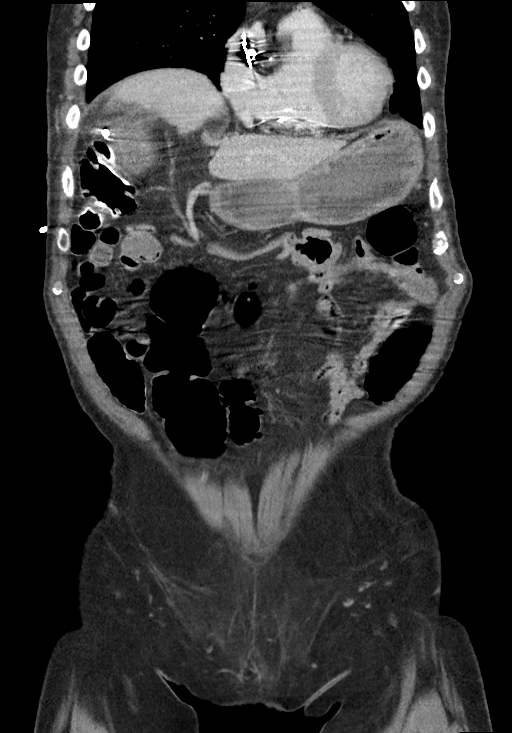
[im 43/96  soft-tissue]
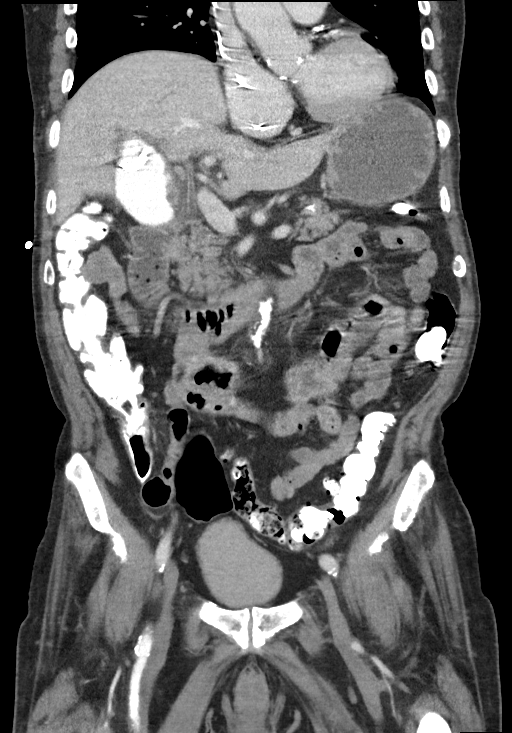
[im 53/96  soft-tissue]
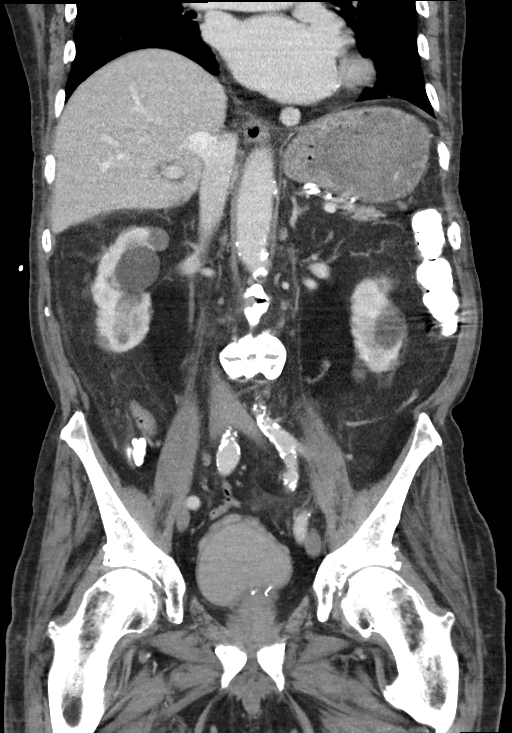

[14 of 46 positions shown; findings below may reference images not displayed]

FINDINGS: Cardiovascular: There is a optimal opacification of the pulmonary
arteries. There is no central,segmental, or subsegmental filling
defects within the pulmonary arteries. There is mild cardiomegaly.
Coronary artery and aortic valve calcifications are seen. No
pericardial effusion or thickening. No evidence right heart strain.
There is normal three-vessel brachiocephalic anatomy without
proximal stenosis. Scattered aortic atherosclerosis is noted.

Mediastinum/Nodes: No hilar, mediastinal, or axillary adenopathy.
Thyroid gland, trachea, and esophagus demonstrate no significant
findings.

Lungs/Pleura: Ill-defined spiculated area of masslike consolidation
seen in the posterior right upper lobe measuring approximately 2.9 x
1.9 cm. A small amount of ground-glass opacity seen along the right
major fissure. The left lung is clear.

Musculoskeletal: No chest wall abnormality. No acute or significant
osseous findings.

Review of the MIP images confirms the above findings.

Abdomen/pelvis:

Hepatobiliary: The liver is normal in density without focal
abnormality.The main portal vein is patent. A percutaneous
cholecystotomy tube is noted. There is bike areas contrast excretion
seen within the gallbladder. There is a tiny foci of air seen
adjacent to the pigtail catheter. There is mild surrounding fat
stranding changes seen. No intrahepatic biliary ductal dilatation is
noted.

Pancreas: Unremarkable. No pancreatic ductal dilatation or
surrounding inflammatory changes.

Spleen: Normal in size without focal abnormality.

Adrenals/Urinary Tract: Both adrenal glands appear normal. Again
noted are multiple low-density lesions seen within both kidneys. The
largest measuring 3 cm within the upper pole. No hydronephrosis is
seen. There is also contrast excretion seen within the bladder with
small superior bladder wall diverticula.

Stomach/Bowel: The stomach, small bowel, are normal in appearance.
There is scattered colonic diverticulosis is seen throughout. There
appears to be mild wall thickening with fluid and stool seen within
the rectum. No loculated fluid collections or free air are seen. The
patient is status post appendectomy.

Vascular/Lymphatic: There are no enlarged mesenteric,
retroperitoneal, or pelvic lymph nodes. Scattered aortic
atherosclerotic calcifications are seen without aneurysmal
dilatation.

Reproductive: Radiation prostate seeds are noted.

Other: No evidence of abdominal wall mass or hernia.

Musculoskeletal: No acute or significant osseous findings.
Degenerative changes are seen throughout the thoracolumbar spine.
IMPRESSION: 1. No central, segmental, or subsegmental pulmonary embolism.
2. Ill-defined spiculated masslike opacity in the posterior right
upper lung measuring 2.9 x 1.9 cm. This appears to have grown in
size since the prior exam of [id number]. This is concerning for primary
lung neoplasm and would recommend PET-CT for further evaluation.
3.  Aortic Atherosclerosis ([id number]).
4. Status post cholecystotomy tube placement with mild surrounding
inflammatory changes. No intrahepatic biliary ductal dilatation.
5. Findings which could be suggestive of mild proctocolitis.

## 2020-12-20 NOTE — Therapy (Signed)
[place] MAIN [facility] SERVICES [address] [place], [place], [address] Phone: [phone]   Fax:  [phone]  Physical Therapy Treatment  Patient Details  Name: Jose Castro MRN: [id number] Date of Birth: Jan 19, 1926 Referring Provider (PT): Dr. Ancil Boozer   Encounter Date: 12/20/2020   PT End of Session - 12/20/20 1052    Visit Number 16    Number of Visits 25    Date for PT Re-Evaluation 12/13/20    Authorization Type eval: 09/20/20    PT Start Time 1100    PT Stop Time 1145    PT Time Calculation (min) 45 min    Equipment Utilized During Treatment Gait belt    Activity Tolerance Patient tolerated treatment well;Patient limited by fatigue;Patient limited by pain    Behavior During Therapy [facility] for tasks assessed/performed           Past Medical History:  Diagnosis Date  . Allergy    Penicillin  . Arthritis   . Basal cell carcinoma   . BPH (benign prostatic hyperplasia)   . Chronic kidney disease    had a kidney stone which per family was a cyst that was removed  . Dysrhythmia   . GERD (gastroesophageal reflux disease)   . History of kidney stones   . Hyperlipidemia   . Hypertension   . Left-sided low back pain with left-sided sciatica   . Lung mass   . Splenic vein thrombosis   . Stroke ([place]) 05/22/2019  . Thrombosis 12/2015   mural  area and no notation of heart attack    Past Surgical History:  Procedure Laterality Date  . APPENDECTOMY    . CATARACT EXTRACTION, BILATERAL    . CYSTOSCOPY WITH INSERTION OF UROLIFT    . ENDOSCOPIC RETROGRADE CHOLANGIOPANCREATOGRAPHY (ERCP) WITH PROPOFOL N/A 05/11/2020   Procedure: ENDOSCOPIC RETROGRADE CHOLANGIOPANCREATOGRAPHY (ERCP) WITH PROPOFOL;  Surgeon: [name], MD;  Location: [facility] ENDOSCOPY;  Service: Endoscopy;  Laterality: N/A;  . ERCP N/A 10/23/2020   Procedure: ENDOSCOPIC RETROGRADE CHOLANGIOPANCREATOGRAPHY (ERCP);  Surgeon: [name], MD;  Location: [facility] ENDOSCOPY;   Service: Endoscopy;  Laterality: N/A;  . EYE SURGERY  08/2018  . IR EXCHANGE BILIARY DRAIN  05/04/2020  . KIDNEY STONE SURGERY    . KNEE SURGERY    . PACEMAKER INSERTION N/A 07/13/2018   Procedure: INSERTION PACEMAKER-DUEL CHAMBER INITIAL IMPLANT;  Surgeon: Isaias Cowman, MD;  Location: [facility] ORS;  Service: Cardiovascular;  Laterality: N/A;  . SPINE SURGERY      There were no vitals filed for this visit.    Subjective Assessment - 12/20/20 1104    Subjective Patient is accompanied with his daughter today and she reports that patient had increased swelling in his ankles last night. He went to the cancer institution prior to therapy session today for a blood draw and is tired from it. He is still awaiting for his CT results. He denies of any falls or injuries since last therapy session.    Patient is accompained by: Family member    Pertinent History Pt referred for deconditioning and gait instability. He has had a decline since he last finished therapy. Pt had a hospital admission from 05/02/20 to 05/14/20 for acute cholecystitis and is now s/p cholecystostomy drain placement. Drain is still in place and pt is waiting to hear when they plan to remove it. Pt discharged to SNF and then returned home. In addition pt underwent radiation therapy for a RUL lung mass/cancer and had his last  radiation treatment in September of 2021. He reports that he has continued to walk at home as his primary exercise. Pt was previously seen at this clinic for PT/OT due to weakness/imbalance s/p CVA. Prior history from 09/21/19: Pt is a 87 year old male who presents with imbalance and difficulty with gait following a right frontal CVA with left hemiparesis on 05/22/19.  He has completed inpatient and home health PT, and now presents for OP PT.  He was discharged from inpatient rehab at a supervision level of assistance, ambulating with a RW.  His family notes that he is impulsive and will try to ambulate without his RW at  home.  Family is currently providing 24/7 supervision.    Limitations House hold activities;Walking    How long can you sit comfortably? no limitations    How long can you walk comfortably? he requires a RW    Patient Stated Goals Pt wants to be able to walk his granddaughter down the aisle at her wedding November 2022    Currently in Pain? No/denies    Pain Onset More than a month ago           Nu-Step x L1 x 5 mins; arms 10, seat 9  Therex  All exercises were completed with 3# ankle weights: Seated LAQ bilateral 2 x 10 reps Seated marches bilateral 2 x 10 reps  Standing hip abduction bilateral x 20 reps Standing hamstring curls 2 x 10 reps each leg  Sit to stand  X 10 reps from straight back chair with one hand on the knees and other hand on chair; verbal cues to scoot out to edge of seat and for forward trunk posture.    Neuro re-ed: Static standing on purple foam with feet normal width and no UE support x 3 30s  Step ups on purple foam x 10 reps each leg   Gait Training:  Ambulated on level surface for 150 ft x 2 requiring RW  Patient required verbal cues and visual demo for correct technique and for safety with balance. He required constant CGA throughout session and use of gait belt.  Clinical Impression: Patient completed strengthening and balance exercises with fair tolerance to activity. Patient demonstrates improved muscle endurance as evidenced by tolerated increasing repetitions without an increase in pain. Patient ambulated on level surface requiring close CGA and constant verbal cues to maintain AD proximal to trunk. Patient demonstrated an antalgic gait pattern with decreased knee extension on RLE during heel strike with decreased step and stride length. Patient will continue to benefit from skilled physical therapy to improve generalized strength, ROM, and capacity for functional activity.        PT Short Term Goals - 12/13/20 1210      PT SHORT TERM GOAL #1    Title Pt will be independent with HEP in order to improve strength and balance in order to decrease fall risk and improve function at home.    Time 6    Period Weeks    Status On-going    Target Date 01/10/21             PT Long Term Goals - 12/13/20 1210      PT LONG TERM GOAL #1   Title Pt will improve BERG by at least 3 points in order to demonstrate clinically significant improvement in balance.    Baseline 09/20/20: 32/56 (Previous discharge: 03/06/20: 46/56), 12/14: 32/56, 1/13: 31/56    Time 4    Period [place]  Status Not Met    Target Date 01/10/21      PT LONG TERM GOAL #2   Title Pt will decrease TUG to below 14 seconds/decrease in order to demonstrate decreased fall risk.    Baseline 09/20/20: 19.2s, 12/14: 17.48, 1/13: 16.65 sec    Time 4    Period Weeks    Status Partially Met    Target Date 01/10/21      PT LONG TERM GOAL #3   Title Pt will decrease 5TSTS by at least 3 seconds in order to demonstrate clinically significant improvement in LE strength.    Baseline 09/20/20: 23.1s with minA+1 for strength and balance (Previous discharge 03/06/20: 16.1s), 12/14: 20 sec, 1/13: 22 sec    Time 4    Period Weeks    Status Partially Met    Target Date 01/10/21      PT LONG TERM GOAL #4   Title Pt will increase his self-selected 10MWT to >0.74 m/s in order to demonstrate improvement in gait speed and improved community ambulation    Baseline 09/20/20: self-selected: 14.4s = 0.69 m/s with rollator (Previous discharge 03/06/20: self-selected: 13.6s = 0.74 m/s), 12/14: 0.75 m/s, 1/13: 0.78 m/s    Time 4    Period Weeks    Status Partially Met    Target Date 01/10/21                 Plan - 12/20/20 1244    Clinical Impression Statement Patient completed strengthening and balance exercises with fair tolerance to activity. Patient demonstrates improved muscle endurance as evidenced by tolerated increasing repetitions without an increase in pain. Patient ambulated on  level surface requiring close CGA and constant verbal cues to maintain AD proximal to trunk. Patient demonstrated an antalgic gait pattern with decreased knee extension on RLE during heel strike with decreased step and stride length. Patient will continue to benefit from skilled physical therapy to improve generalized strength, ROM, and capacity for functional activity.    Personal Factors and Comorbidities Age;Comorbidity 3+    Comorbidities HTN, CVA, cholecystitis with cholecystostomy drain    Examination-Activity Limitations Bend;Lift;Squat;Locomotion Level;Stairs;Stand    Examination-Participation Restrictions Community Activity;Driving;Laundry;Meal Prep;Shop    Stability/Clinical Decision Making Unstable/Unpredictable    Rehab Potential Fair    PT Frequency 2x / week    PT Duration 4 weeks    PT Treatment/Interventions ADLs/Self Care Home Management;Canalith Repostioning;Cryotherapy;Electrical Stimulation;Iontophoresis 72m/ml Dexamethasone;Moist Heat;Traction;Ultrasound;DME Instruction;Gait training;Stair training;Therapeutic activities;Therapeutic exercise;Functional mobility training;Balance training;Neuromuscular re-education;Patient/family education;Manual techniques;Dry needling;Vestibular;Joint Manipulations;Spinal Manipulations;Aquatic Therapy    PT Next Visit Plan work on LE strengthening and balance    PT Home Exercise Plan advance as tolerated;    Consulted and Agree with Plan of Care Patient    Family Member Consulted granddaughter           Patient will benefit from skilled therapeutic intervention in order to improve the following deficits and impairments:  Abnormal gait,Decreased balance,Decreased mobility,Difficulty walking,Decreased knowledge of precautions,Decreased safety awareness,Decreased activity tolerance,Decreased strength,Decreased endurance  Visit Diagnosis: Muscle weakness (generalized)  Unsteadiness on feet  Decreased strength of lower extremity  Other  lack of coordination  Other abnormalities of gait and mobility  Right middle cerebral artery stroke ([facility]     Problem List Patient Active Problem List   Diagnosis Date Noted  . Calculus of bile duct without cholecystitis and without obstruction   . Non-small cell carcinoma of lung, right [place] 09/07/2020  . Mass of upper lobe of right lung 07/13/2020  . Calculus of  bile duct with cholecystitis without obstruction   . Abnormal findings on imaging of biliary tract   . Acute cholecystitis 05/02/2020  . Chronic venous insufficiency 12/05/2019  . 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 CKD (chronic kidney disease), stage II   . New onset atrial fibrillation ([place])   . 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 Goals of care, counseling/discussion   . Palliative care by specialist   . DNR (do not resuscitate) discussion   . History of CVA (cerebrovascular accident) 05/22/2019  . Persistent proteinuria 07/30/2018  . 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 Chronic kidney disease, stage III (moderate) ([place]) 06/15/2018  . Leg pain 05/06/2018  . Lymphedema 05/06/2018  . Coagulopathy ([place]) 07/07/2017  . Lumbar spondylosis 03/10/2017  . Elevated uric acid in blood 11/20/2016  . BPH (benign prostatic hyperplasia) 05/29/2016  . Peripheral vascular disease of lower extremity ([place]) 03/03/2016  . Gallstone 12/31/2015  . 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 Atherosclerosis of aorta ([place]) 11/22/2015  . Carotid artery narrowing 11/22/2015  . Diverticulosis of colon 11/22/2015  . Decreased creatinine clearance 11/22/2015  . Arthritis, degenerative 11/22/2015  . Lactose intolerance 11/22/2015  . Basal cell carcinoma 11/22/2015  . Essential hypertension 06/21/2015  . Hyperlipemia 06/21/2015  . GERD (gastroesophageal reflux disease) 06/21/2015  . Calculus of kidney 11/18/2013   Karl Luke PT, DPT Netta Corrigan 12/20/2020, 12:47 PM  [place] MAIN [facility] SERVICES [address] [place], [place], [address] Phone: [phone]   Fax:  [phone]  Name: Jose Castro MRN: [id number] Date of Birth: 08/06/1926

## 2020-12-21 ENCOUNTER — Other Ambulatory Visit: Payer: Self-pay | Admitting: *Deleted

## 2020-12-21 ENCOUNTER — Telehealth: Payer: Self-pay | Admitting: Internal Medicine

## 2020-12-21 ENCOUNTER — Encounter: Payer: Self-pay | Admitting: Family Medicine

## 2020-12-21 DIAGNOSIS — I712 Thoracic aortic aneurysm, without rupture, unspecified: Secondary | ICD-10-CM | POA: Insufficient documentation

## 2020-12-21 DIAGNOSIS — R918 Other nonspecific abnormal finding of lung field: Secondary | ICD-10-CM

## 2020-12-21 DIAGNOSIS — C3490 Malignant neoplasm of unspecified part of unspecified bronchus or lung: Secondary | ICD-10-CM

## 2020-12-21 NOTE — Telephone Encounter (Signed)
Future Lab orders entered

## 2020-12-21 NOTE — Telephone Encounter (Signed)
Colette- please arrange for apts in 6 months; MD; labs- cbc/cmp;CT chest on-contrast prior- Dr.B

## 2020-12-21 NOTE — Telephone Encounter (Signed)
On 1/19-I spoke to patient's daughter, [name] regarding results of the CT scan shows radiation changes.  Otherwise continues to have groundglass opacity right middle lobe/and again subcentimeter nodule. Patient has appointment with radiation oncology-to review the results of the scans.  Daughter/family had earlier called to cancel the appointments with medical oncology.  With regards to appointment daughter states that -the medical oncology appointments were canceled because of scheduling imaging etc.  Haley-please check with the daughter regarding the preference for follow-up with [place].  If they are interested in follow-up with [place] I would recommend a follow-up noncontrast CT scan chest in 6 months.   H-in 6 months; MD; labs- cbc/cmp;CT chest on-contrast prior- Dr.B

## 2020-12-24 ENCOUNTER — Other Ambulatory Visit: Payer: Medicare Other

## 2020-12-24 NOTE — Telephone Encounter (Signed)
Please arrange for lab/md in 6 months with a ct scan of chest prior to the apt.

## 2020-12-25 ENCOUNTER — Ambulatory Visit: Payer: Medicare Other | Admitting: Physical Therapy

## 2020-12-25 ENCOUNTER — Other Ambulatory Visit: Payer: Self-pay

## 2020-12-25 ENCOUNTER — Ambulatory Visit: Payer: Medicare Other

## 2020-12-25 DIAGNOSIS — R29898 Other symptoms and signs involving the musculoskeletal system: Secondary | ICD-10-CM

## 2020-12-25 DIAGNOSIS — R278 Other lack of coordination: Secondary | ICD-10-CM

## 2020-12-25 DIAGNOSIS — R2689 Other abnormalities of gait and mobility: Secondary | ICD-10-CM

## 2020-12-25 DIAGNOSIS — M6281 Muscle weakness (generalized): Secondary | ICD-10-CM

## 2020-12-25 DIAGNOSIS — R2681 Unsteadiness on feet: Secondary | ICD-10-CM

## 2020-12-25 NOTE — Therapy (Signed)
[place] MAIN [facility] SERVICES [address] [place], [place], [address] Phone: [phone]   Fax:  [phone]  Physical Therapy Treatment  Patient Details  Name: Jose Castro MRN: [id number] Date of Birth: [birth year] Referring Provider (PT): Dr. Ancil Boozer   Encounter Date: 12/25/2020   PT End of Session - 12/25/20 1109    Visit Number 17    Number of Visits 25    Date for PT Re-Evaluation 01/10/21    Authorization Type eval: 09/20/20    PT Start Time 1100    PT Stop Time 1145    PT Time Calculation (min) 45 min    Equipment Utilized During Treatment Gait belt    Activity Tolerance Patient tolerated treatment well;Patient limited by fatigue;Patient limited by pain    Behavior During Therapy [facility] for tasks assessed/performed           Past Medical History:  Diagnosis Date  . Allergy    Penicillin  . Arthritis   . Basal cell carcinoma   . BPH (benign prostatic hyperplasia)   . Chronic kidney disease    had a kidney stone which per family was a cyst that was removed  . Dysrhythmia   . GERD (gastroesophageal reflux disease)   . History of kidney stones   . Hyperlipidemia   . Hypertension   . Left-sided low back pain with left-sided sciatica   . Lung mass   . Splenic vein thrombosis   . Stroke ([place]) 05/22/2019  . Thrombosis 12/2015   mural  area and no notation of heart attack    Past Surgical History:  Procedure Laterality Date  . APPENDECTOMY    . CATARACT EXTRACTION, BILATERAL    . CYSTOSCOPY WITH INSERTION OF UROLIFT    . ENDOSCOPIC RETROGRADE CHOLANGIOPANCREATOGRAPHY (ERCP) WITH PROPOFOL N/A 05/11/2020   Procedure: ENDOSCOPIC RETROGRADE CHOLANGIOPANCREATOGRAPHY (ERCP) WITH PROPOFOL;  Surgeon: [name], MD;  Location: [facility] ENDOSCOPY;  Service: Endoscopy;  Laterality: N/A;  . ERCP N/A 10/23/2020   Procedure: ENDOSCOPIC RETROGRADE CHOLANGIOPANCREATOGRAPHY (ERCP);  Surgeon: [name], MD;  Location: [facility] ENDOSCOPY;   Service: Endoscopy;  Laterality: N/A;  . EYE SURGERY  08/2018  . IR EXCHANGE BILIARY DRAIN  05/04/2020  . KIDNEY STONE SURGERY    . KNEE SURGERY    . PACEMAKER INSERTION N/A 07/13/2018   Procedure: INSERTION PACEMAKER-DUEL CHAMBER INITIAL IMPLANT;  Surgeon: Isaias Cowman, MD;  Location: [facility] ORS;  Service: Cardiovascular;  Laterality: N/A;  . SPINE SURGERY      There were no vitals filed for this visit.   Subjective Assessment - 12/25/20 1107    Subjective Patient denies of any new symptoms or falls since last therapy session.    Patient is accompained by: Family member    Pertinent History Pt referred for deconditioning and gait instability. He has had a decline since he last finished therapy. Pt had a hospital admission from 05/02/20 to 05/14/20 for acute cholecystitis and is now s/p cholecystostomy drain placement. Drain is still in place and pt is waiting to hear when they plan to remove it. Pt discharged to SNF and then returned home. In addition pt underwent radiation therapy for a RUL lung mass/cancer and had his last radiation treatment in September of 2021. He reports that he has continued to walk at home as his primary exercise. Pt was previously seen at this clinic for PT/OT due to weakness/imbalance s/p CVA. Prior history from 09/21/19: Pt is a 87 year old male who presents with imbalance and  difficulty with gait following a right frontal CVA with left hemiparesis on 05/22/19.  He has completed inpatient and home health PT, and now presents for OP PT.  He was discharged from inpatient rehab at a supervision level of assistance, ambulating with a RW.  His family notes that he is impulsive and will try to ambulate without his RW at home.  Family is currently providing 24/7 supervision.    Limitations House hold activities;Walking    How long can you sit comfortably? no limitations    How long can you walk comfortably? he requires a RW    Patient Stated Goals Pt wants to be able to walk  his granddaughter down the aisle at her wedding November 2022    Currently in Pain? No/denies    Pain Onset More than a month ago             Nu-Step x L1 x 5 mins; arms 10, seat 9  Therex  All exercises were completed with 3# ankle weights: Seated LAQ bilateral 2 x 10 reps Seated marches bilateral 2 x 10 reps  Standing hip abduction bilateral x 20 reps Standing hip flexion bilateral x 20 reps  Standing hamstring curls 2 x 10 reps each leg  Sit to stand  X 10 reps from straight back chair with one hand on the knees and other hand on chair; verbal cues to scoot out to edge of seat and for forward trunk posture.  Precor leg press BLE extension x 25# x 20 reps   Neuro re-ed: Static standing on purple foam with feet normal width and no UE support x EO/EC x 3 30s  holds Airex x normal stance x throwing ball x multiple reps Airex x WBOS x throwing ball x multiple reps Stair taps with 3# x unilateral hand on railing x 20 reps each leg    Clinical Impression: Patient completed strengthening exercises with fair tolerance to activity. Patient demonstrates improvement with endurance as patient required decreased amount of seated rest break. Patient has significant postural swaying with balance exercises due to decreased proprioception cues. He sways anteriorly and posteriorly with heavy posterior lean requiring minA to maintain COG within BOS. Patient will continue to benefit from skilled physical therapy to improve generalized strength, ROM, and capacity for functional activity.      PT Short Term Goals - 12/13/20 1210      PT SHORT TERM GOAL #1   Title Pt will be independent with HEP in order to improve strength and balance in order to decrease fall risk and improve function at home.    Time 6    Period Weeks    Status On-going    Target Date 01/10/21             PT Long Term Goals - 12/13/20 1210      PT LONG TERM GOAL #1   Title Pt will improve BERG by at least 3 points in  order to demonstrate clinically significant improvement in balance.    Baseline 09/20/20: 32/56 (Previous discharge: 03/06/20: 46/56), 12/14: 32/56, 1/13: 31/56    Time 4    Period Weeks    Status Not Met    Target Date 01/10/21      PT LONG TERM GOAL #2   Title Pt will decrease TUG to below 14 seconds/decrease in order to demonstrate decreased fall risk.    Baseline 09/20/20: 19.2s, 12/14: 17.48, 1/13: 16.65 sec    Time 4    Period  Weeks    Status Partially Met    Target Date 01/10/21      PT LONG TERM GOAL #3   Title Pt will decrease 5TSTS by at least 3 seconds in order to demonstrate clinically significant improvement in LE strength.    Baseline 09/20/20: 23.1s with minA+1 for strength and balance (Previous discharge 03/06/20: 16.1s), 12/14: 20 sec, 1/13: 22 sec    Time 4    Period Weeks    Status Partially Met    Target Date 01/10/21      PT LONG TERM GOAL #4   Title Pt will increase his self-selected 10MWT to >0.74 m/s in order to demonstrate improvement in gait speed and improved community ambulation    Baseline 09/20/20: self-selected: 14.4s = 0.69 m/s with rollator (Previous discharge 03/06/20: self-selected: 13.6s = 0.74 m/s), 12/14: 0.75 m/s, 1/13: 0.78 m/s    Time 4    Period Weeks    Status Partially Met    Target Date 01/10/21                 Plan - 12/25/20 1205    Clinical Impression Statement Patient completed strengthening exercises with fair tolerance to activity. Patient demonstrates improvement with endurance as patient required decreased amount of seated rest break. Patient has significant postural swaying with balance exercises due to decreased proprioception cues. He sways anteriorly and posteriorly with heavy posterior lean requiring minA to maintain COG within BOS. Patient will continue to benefit from skilled physical therapy to improve generalized strength, ROM, and capacity for functional activity.    Personal Factors and Comorbidities Age;Comorbidity  3+    Comorbidities HTN, CVA, cholecystitis with cholecystostomy drain    Examination-Activity Limitations Bend;Lift;Squat;Locomotion Level;Stairs;Stand    Examination-Participation Restrictions Community Activity;Driving;Laundry;Meal Prep;Shop    Stability/Clinical Decision Making Unstable/Unpredictable    Rehab Potential Fair    PT Frequency 2x / week    PT Duration 4 weeks    PT Treatment/Interventions ADLs/Self Care Home Management;Canalith Repostioning;Cryotherapy;Electrical Stimulation;Iontophoresis 4mg /ml Dexamethasone;Moist Heat;Traction;Ultrasound;DME Instruction;Gait training;Stair training;Therapeutic activities;Therapeutic exercise;Functional mobility training;Balance training;Neuromuscular re-education;Patient/family education;Manual techniques;Dry needling;Vestibular;Joint Manipulations;Spinal Manipulations;Aquatic Therapy    PT Next Visit Plan work on LE strengthening and balance    PT Home Exercise Plan advance as tolerated;    Consulted and Agree with Plan of Care Patient    Family Member Consulted granddaughter           Patient will benefit from skilled therapeutic intervention in order to improve the following deficits and impairments:  Abnormal gait,Decreased balance,Decreased mobility,Difficulty walking,Decreased knowledge of precautions,Decreased safety awareness,Decreased activity tolerance,Decreased strength,Decreased endurance  Visit Diagnosis: Muscle weakness (generalized)  Unsteadiness on feet  Decreased strength of lower extremity  Other lack of coordination  Other abnormalities of gait and mobility     Problem List Patient Active Problem List   Diagnosis Date Noted  . Aneurysm, aorta, thoracic ([place]) 12/21/2020  . Calculus of bile duct without cholecystitis and without obstruction   . Non-small cell carcinoma of lung, right ([place]) 09/07/2020  . Mass of upper lobe of right lung 07/13/2020  . Calculus of bile duct with cholecystitis without  obstruction   . Abnormal findings on imaging of biliary tract   . Acute cholecystitis 05/02/2020  . Chronic venous insufficiency 12/05/2019  . 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 CKD (chronic kidney disease), stage II   . New onset atrial fibrillation ([place])   . 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 Goals of care, counseling/discussion   .  Palliative care by specialist   . DNR (do not resuscitate) discussion   . History of CVA (cerebrovascular accident) 05/22/2019  . Persistent proteinuria 07/30/2018  . 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 Chronic kidney disease, stage III (moderate) ([place]) 06/15/2018  . Leg pain 05/06/2018  . Lymphedema 05/06/2018  . Coagulopathy ([place]) 07/07/2017  . Lumbar spondylosis 03/10/2017  . Elevated uric acid in blood 11/20/2016  . BPH (benign prostatic hyperplasia) 05/29/2016  . Peripheral vascular disease of lower extremity ([place]) 03/03/2016  . Gallstone 12/31/2015  . 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 Atherosclerosis of aorta ([place]) 11/22/2015  . Carotid artery narrowing 11/22/2015  . Diverticulosis of colon 11/22/2015  . Decreased creatinine clearance 11/22/2015  . Arthritis, degenerative 11/22/2015  . Lactose intolerance 11/22/2015  . Basal cell carcinoma 11/22/2015  . Essential hypertension 06/21/2015  . Hyperlipemia 06/21/2015  . GERD (gastroesophageal reflux disease) 06/21/2015  . Calculus of kidney 11/18/2013   Karl Luke PT, DPT Netta Corrigan 12/25/2020, 12:07 PM  [place] MAIN [facility] SERVICES [address] [place], [place], [address] Phone: [phone]   Fax:  [phone]  Name: Jose Castro MRN: [id number] Date of Birth: 12-Jul-1926

## 2020-12-26 ENCOUNTER — Encounter: Payer: Self-pay | Admitting: Radiation Oncology

## 2020-12-26 ENCOUNTER — Ambulatory Visit
Admission: RE | Admit: 2020-12-26 | Discharge: 2020-12-26 | Disposition: A | Discharge status: Home / Self Care | Payer: Medicare Other | Source: Ambulatory Visit | Attending: Radiation Oncology | Admitting: Radiation Oncology

## 2020-12-26 ENCOUNTER — Other Ambulatory Visit: Payer: Self-pay | Admitting: *Deleted

## 2020-12-26 VITALS — BP 106/66 | HR 87 | Temp 98.0°F | Wt 151.0 lb

## 2020-12-26 DIAGNOSIS — C3411 Malignant neoplasm of upper lobe, right bronchus or lung: Secondary | ICD-10-CM

## 2020-12-26 NOTE — Progress Notes (Signed)
Radiation Oncology Follow up Note  Name: Jose Castro   Date:   12/26/2020 MRN:  [id number] DOB: 08-01-1926    This 87 y.o. male presents to the clinic today for 49-month follow-up status post SBRT to the posterior portion of the right upper lobe for stage I non-small cell lung cancer.  REFERRING PROVIDER: Steele Sizer, MD  HPI: Patient is a 87 year old male now at 4 months having completed SBRT to his right upper lobe for stage I non-small cell lung cancer.  He is seen today in follow-up doing well.  Specifically [place] cough hemoptysis or chest tightness..  He had a recent CT scan of the chest showing findings consistent with radiation change in the right upper lobe.  There are 2 additional lesions within the right lung which appear unchanged from prior examination.  Incidentally he does have aneurysmal dilatation of the ascending thoracic aorta.  COMPLICATIONS OF TREATMENT: none  FOLLOW UP COMPLIANCE: keeps appointments   PHYSICAL EXAM:  BP 106/66   Pulse 87   Temp 98 F (36.7 C) (Tympanic)   Wt 151 lb (68.5 kg)   BMI 21.67 kg/m  Elderly male in NAD.  Well-developed well-nourished patient in NAD. HEENT reveals PERLA, EOMI, discs not visualized.  Oral cavity is clear. No oral mucosal lesions are identified. Neck is clear without evidence of cervical or supraclavicular adenopathy. Lungs are clear to A&P. Cardiac examination is essentially unremarkable with regular rate and rhythm without murmur rub or thrill. Abdomen is benign with no organomegaly or masses noted. Motor sensory and DTR levels are equal and symmetric in the upper and lower extremities. Cranial nerves II through XII are grossly intact. Proprioception is intact. No peripheral adenopathy or edema is identified. No motor or sensory levels are noted. Crude visual fields are within normal range.  RADIOLOGY RESULTS: Serial CT scans reviewed compatible with above-stated findings  PLAN: At this time I believe we are dealing all  with radiation changes from his SBRT these the findings we expect to see.  He is asymptomatic at this time.  I have asked to see him back in 6 months for follow-up with a repeat CT scan at that time.  Patient's daughter was present.  Both seem to comprehend my recommendations well.  I would like to take this opportunity to thank you for allowing me to participate in the care of your patient.Noreene Filbert, MD

## 2020-12-26 NOTE — Addendum Note (Signed)
Addended by: Telford Nab on: 12/26/2020 02:34 PM   Modules accepted: Orders

## 2020-12-26 NOTE — Telephone Encounter (Signed)
Spoke with pt and his daughter during follow up visit with Dr. Baruch Gouty today. They are in agreement with continuing follow up with Dr. B and Dr. Baruch Gouty with CT scan prior. Pt's daughter requests that follow up visits be coordinated on the same day if possible. Order placed for CT scan.

## 2020-12-27 ENCOUNTER — Other Ambulatory Visit: Payer: Self-pay

## 2020-12-27 ENCOUNTER — Ambulatory Visit: Payer: Medicare Other | Admitting: Physical Therapy

## 2020-12-27 DIAGNOSIS — M6281 Muscle weakness (generalized): Secondary | ICD-10-CM

## 2020-12-27 DIAGNOSIS — R278 Other lack of coordination: Secondary | ICD-10-CM

## 2020-12-27 DIAGNOSIS — I63511 Cerebral infarction due to unspecified occlusion or stenosis of right middle cerebral artery: Secondary | ICD-10-CM

## 2020-12-27 DIAGNOSIS — R2681 Unsteadiness on feet: Secondary | ICD-10-CM

## 2020-12-27 DIAGNOSIS — R29898 Other symptoms and signs involving the musculoskeletal system: Secondary | ICD-10-CM

## 2020-12-27 DIAGNOSIS — R2689 Other abnormalities of gait and mobility: Secondary | ICD-10-CM

## 2020-12-27 NOTE — Therapy (Signed)
[place] MAIN [facility] SERVICES [address] [place], [place], [address] Phone: [phone]   Fax:  [phone]  Physical Therapy Treatment  Patient Details  Name: Jose Castro MRN: [id number] Date of Birth: [birth year] Referring Provider (PT): Dr. Ancil Boozer   Encounter Date: 12/27/2020   PT End of Session - 12/27/20 1110    Visit Number 18    Number of Visits 25    Date for PT Re-Evaluation 01/10/21    Authorization Type eval: 09/20/20    PT Start Time 1100    PT Stop Time 1145    PT Time Calculation (min) 45 min    Equipment Utilized During Treatment Gait belt    Activity Tolerance Patient tolerated treatment well;Patient limited by fatigue;Patient limited by pain    Behavior During Therapy [facility] for tasks assessed/performed           Past Medical History:  Diagnosis Date  . Allergy    Penicillin  . Arthritis   . Basal cell carcinoma   . BPH (benign prostatic hyperplasia)   . Chronic kidney disease    had a kidney stone which per family was a cyst that was removed  . Dysrhythmia   . GERD (gastroesophageal reflux disease)   . History of kidney stones   . Hyperlipidemia   . Hypertension   . Left-sided low back pain with left-sided sciatica   . Lung mass   . Splenic vein thrombosis   . Stroke ([place]) 05/22/2019  . Thrombosis 12/2015   mural  area and no notation of heart attack    Past Surgical History:  Procedure Laterality Date  . APPENDECTOMY    . CATARACT EXTRACTION, BILATERAL    . CYSTOSCOPY WITH INSERTION OF UROLIFT    . ENDOSCOPIC RETROGRADE CHOLANGIOPANCREATOGRAPHY (ERCP) WITH PROPOFOL N/A 05/11/2020   Procedure: ENDOSCOPIC RETROGRADE CHOLANGIOPANCREATOGRAPHY (ERCP) WITH PROPOFOL;  Surgeon: [name], MD;  Location: [facility] ENDOSCOPY;  Service: Endoscopy;  Laterality: N/A;  . ERCP N/A 10/23/2020   Procedure: ENDOSCOPIC RETROGRADE CHOLANGIOPANCREATOGRAPHY (ERCP);  Surgeon: [name], MD;  Location: [facility] ENDOSCOPY;   Service: Endoscopy;  Laterality: N/A;  . EYE SURGERY  08/2018  . IR EXCHANGE BILIARY DRAIN  05/04/2020  . KIDNEY STONE SURGERY    . KNEE SURGERY    . PACEMAKER INSERTION N/A 07/13/2018   Procedure: INSERTION PACEMAKER-DUEL CHAMBER INITIAL IMPLANT;  Surgeon: Isaias Cowman, MD;  Location: [facility] ORS;  Service: Cardiovascular;  Laterality: N/A;  . SPINE SURGERY      There were no vitals filed for this visit.   Subjective Assessment - 12/27/20 1108    Subjective Patient reports that he went to his doctor office last week and he is cancer free. Patient denies of any new symptoms or pain since last therapy session.    Patient is accompained by: Family member    Pertinent History Pt referred for deconditioning and gait instability. He has had a decline since he last finished therapy. Pt had a hospital admission from 05/02/20 to 05/14/20 for acute cholecystitis and is now s/p cholecystostomy drain placement. Drain is still in place and pt is waiting to hear when they plan to remove it. Pt discharged to SNF and then returned home. In addition pt underwent radiation therapy for a RUL lung mass/cancer and had his last radiation treatment in September of 2021. He reports that he has continued to walk at home as his primary exercise. Pt was previously seen at this clinic for PT/OT due to weakness/imbalance  s/p CVA. Prior history from 09/21/19: Pt is a 87 year old male who presents with imbalance and difficulty with gait following a right frontal CVA with left hemiparesis on 05/22/19.  He has completed inpatient and home health PT, and now presents for OP PT.  He was discharged from inpatient rehab at a supervision level of assistance, ambulating with a RW.  His family notes that he is impulsive and will try to ambulate without his RW at home.  Family is currently providing 24/7 supervision.    Limitations House hold activities;Walking    How long can you sit comfortably? no limitations    How long can you walk  comfortably? he requires a RW    Patient Stated Goals Pt wants to be able to walk his granddaughter down the aisle at her wedding November 2022    Currently in Pain? No/denies    Pain Onset More than a month ago            Nu-Step x L1 x 5 mins; arms 10, seat 9  Therex:    Standing heel raises x 20 reps  Standing mini squats with unilateral UE support x 20 reps  Step overs x orange hurdles x unilateral UE support x forward/sideways x 10 reps each leg   Sit to stands with BUE support x 10 reps, without BUE support x 3 reps with minA to boost hip extension    Neuro Re-ed: KORE balance x penguin race x multiple reps to facilitate better weight bearing through LLE   Gait training: ambulated on level surface for 300 ft requiring SBA with decreased L knee extension and L ankle DF                 PT Short Term Goals - 12/13/20 1210      PT SHORT TERM GOAL #1   Title Pt will be independent with HEP in order to improve strength and balance in order to decrease fall risk and improve function at home.    Time 6    Period Weeks    Status On-going    Target Date 01/10/21             PT Long Term Goals - 12/13/20 1210      PT LONG TERM GOAL #1   Title Pt will improve BERG by at least 3 points in order to demonstrate clinically significant improvement in balance.    Baseline 09/20/20: 32/56 (Previous discharge: 03/06/20: 46/56), 12/14: 32/56, 1/13: 31/56    Time 4    Period Weeks    Status Not Met    Target Date 01/10/21      PT LONG TERM GOAL #2   Title Pt will decrease TUG to below 14 seconds/decrease in order to demonstrate decreased fall risk.    Baseline 09/20/20: 19.2s, 12/14: 17.48, 1/13: 16.65 sec    Time 4    Period Weeks    Status Partially Met    Target Date 01/10/21      PT LONG TERM GOAL #3   Title Pt will decrease 5TSTS by at least 3 seconds in order to demonstrate clinically significant improvement in LE strength.    Baseline 09/20/20: 23.1s with  minA+1 for strength and balance (Previous discharge 03/06/20: 16.1s), 12/14: 20 sec, 1/13: 22 sec    Time 4    Period Weeks    Status Partially Met    Target Date 01/10/21      PT LONG TERM GOAL #4  Title Pt will increase his self-selected 10MWT to >0.74 m/s in order to demonstrate improvement in gait speed and improved community ambulation    Baseline 09/20/20: self-selected: 14.4s = 0.69 m/s with rollator (Previous discharge 03/06/20: self-selected: 13.6s = 0.74 m/s), 12/14: 0.75 m/s, 1/13: 0.78 m/s    Time 4    Period Weeks    Status Partially Met    Target Date 01/10/21                 Plan - 12/27/20 1224    Clinical Impression Statement Patient completed strength training with fair tolerance to activity. Patient ambulated on level surface for 300 feet with difficulty during turns due to instability of L knee. He demonstrates decreased L knee extension during heel strike and decreased hip flexion and ankle DF during swing phase causing a mild shuffling gait pattern. Patient reports that he is limited for a longer distance due to increased pain in L knee with weight bearing for longer period of time.    Personal Factors and Comorbidities Age;Comorbidity 3+    Comorbidities HTN, CVA, cholecystitis with cholecystostomy drain    Examination-Activity Limitations Bend;Lift;Squat;Locomotion Level;Stairs;Stand    Examination-Participation Restrictions Community Activity;Driving;Laundry;Meal Prep;Shop    Stability/Clinical Decision Making Unstable/Unpredictable    Rehab Potential Fair    PT Frequency 2x / week    PT Duration 4 weeks    PT Treatment/Interventions ADLs/Self Care Home Management;Canalith Repostioning;Cryotherapy;Electrical Stimulation;Iontophoresis 66m/ml Dexamethasone;Moist Heat;Traction;Ultrasound;DME Instruction;Gait training;Stair training;Therapeutic activities;Therapeutic exercise;Functional mobility training;Balance training;Neuromuscular re-education;Patient/family  education;Manual techniques;Dry needling;Vestibular;Joint Manipulations;Spinal Manipulations;Aquatic Therapy    PT Next Visit Plan work on LE strengthening and balance    PT Home Exercise Plan advance as tolerated;    Consulted and Agree with Plan of Care Patient    Family Member Consulted granddaughter           Patient will benefit from skilled therapeutic intervention in order to improve the following deficits and impairments:  Abnormal gait,Decreased balance,Decreased mobility,Difficulty walking,Decreased knowledge of precautions,Decreased safety awareness,Decreased activity tolerance,Decreased strength,Decreased endurance  Visit Diagnosis: Muscle weakness (generalized)  Unsteadiness on feet  Decreased strength of lower extremity  Other lack of coordination  Other abnormalities of gait and mobility  Right middle cerebral artery stroke ([facility]     Problem List Patient Active Problem List   Diagnosis Date Noted  . Aneurysm, aorta, thoracic ([place] 12/21/2020  . Calculus of bile duct without cholecystitis and without obstruction   . Non-small cell carcinoma of lung, right ([place] 09/07/2020  . Mass of upper lobe of right lung 07/13/2020  . Calculus of bile duct with cholecystitis without obstruction   . Abnormal findings on imaging of biliary tract   . Acute cholecystitis 05/02/2020  . Chronic venous insufficiency 12/05/2019  . 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 CKD (chronic kidney disease), stage II   . New onset atrial fibrillation ([place]   . Right middle cerebral artery stroke ([place] 05/27/2019  . Goals of care, counseling/discussion   . Palliative care by specialist   . DNR (do not resuscitate) discussion   . History of CVA (cerebrovascular accident) 05/22/2019  . Persistent proteinuria 07/30/2018  . 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 Chronic kidney disease, stage III (moderate) ([place] 06/15/2018  . Leg pain 05/06/2018  . Lymphedema  05/06/2018  . Coagulopathy ([place] 07/07/2017  . Lumbar spondylosis 03/10/2017  . Elevated uric acid in blood 11/20/2016  . BPH (benign prostatic hyperplasia) 05/29/2016  . Peripheral vascular disease  of lower extremity ([place]) 03/03/2016  . Gallstone 12/31/2015  . 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 Atherosclerosis of aorta ([place]) 11/22/2015  . Carotid artery narrowing 11/22/2015  . Diverticulosis of colon 11/22/2015  . Decreased creatinine clearance 11/22/2015  . Arthritis, degenerative 11/22/2015  . Lactose intolerance 11/22/2015  . Basal cell carcinoma 11/22/2015  . Essential hypertension 06/21/2015  . Hyperlipemia 06/21/2015  . GERD (gastroesophageal reflux disease) 06/21/2015  . Calculus of kidney 11/18/2013   Karl Luke PT, DPT Netta Corrigan 12/27/2020, 12:27 PM  [place] MAIN [facility] SERVICES [address] [place], [place], [address] Phone: [phone]   Fax:  [phone]  Name: Jose Castro MRN: [id number] Date of Birth: 1926/02/15

## 2020-12-30 DIAGNOSIS — M7751 Other enthesopathy of right foot: Secondary | ICD-10-CM | POA: Diagnosis not present

## 2020-12-30 MED ORDER — BETAMETHASONE SOD PHOS & ACET 6 (3-3) MG/ML IJ SUSP
3.0000 mg | Freq: Once | INTRAMUSCULAR | Status: AC
  Administered 2020-12-30: 3 mg via INTRA_ARTICULAR

## 2020-12-30 NOTE — Progress Notes (Signed)
   HPI: 87 y.o. male presenting today for evaluation of a new complaint and developed to the patient's dorsal aspect of the right foot.  He states that it started approximately 1 day ago.  He is in excruciating pain and it burns like fire.  He states it is very painful to walk.  He presents today as an urgent work in for further evaluation.  He denies a history of injury.  Past Medical History:  Diagnosis Date  . Allergy    Penicillin  . Arthritis   . Basal cell carcinoma   . BPH (benign prostatic hyperplasia)   . Chronic kidney disease    had a kidney stone which per family was a cyst that was removed  . Dysrhythmia   . GERD (gastroesophageal reflux disease)   . History of kidney stones   . Hyperlipidemia   . Hypertension   . Left-sided low back pain with left-sided sciatica   . Lung mass   . Splenic vein thrombosis   . Stroke ([place]) 05/22/2019  . Thrombosis 12/2015   mural  area and no notation of heart attack     Physical Exam: General: The patient is alert and oriented x3 in no acute distress.  Dermatology: Skin is warm, dry and supple bilateral lower extremities. Negative for open lesions or macerations.  Vascular: Palpable pedal pulses bilaterally. No edema or erythema noted. Capillary refill within normal limits.  Negative for any significant erythema that would be consistent with a acute gout flareup  Neurological: Epicritic and protective threshold grossly intact bilaterally.   Musculoskeletal Exam: Range of motion within normal limits to all pedal and ankle joints bilateral. Muscle strength 5/5 in all groups bilateral.  There is significant pain on palpation range of motion of the first MTPJ of the right foot.  There is also limited range of motion consistent with a hallux limitus  Assessment: 1.  First MTPJ capsulitis right 2. hallux limitus right   Plan of Care:  1. Patient evaluated.  2.  Injection of 0.5 cc Celestone Soluspan injection of the first MTPJ right  foot 3.  Continue OTC Tylenol daily 4.  Return to clinic as needed  *Presented today with his son, Ronalee Belts, from [place]      Delana Manganello M. Addylin Manke, DPM [facility]  Dr. Edrick Kins, DPM    [address], [place] [address]                Office [phone]  Fax [phone]

## 2020-12-31 ENCOUNTER — Ambulatory Visit: Payer: Medicare Other

## 2020-12-31 ENCOUNTER — Other Ambulatory Visit: Payer: Self-pay

## 2020-12-31 DIAGNOSIS — M79674 Pain in right toe(s): Secondary | ICD-10-CM

## 2020-12-31 DIAGNOSIS — R278 Other lack of coordination: Secondary | ICD-10-CM

## 2020-12-31 DIAGNOSIS — M6281 Muscle weakness (generalized): Secondary | ICD-10-CM

## 2020-12-31 DIAGNOSIS — R2681 Unsteadiness on feet: Secondary | ICD-10-CM

## 2020-12-31 DIAGNOSIS — R5383 Other fatigue: Secondary | ICD-10-CM

## 2020-12-31 DIAGNOSIS — R2689 Other abnormalities of gait and mobility: Secondary | ICD-10-CM

## 2020-12-31 DIAGNOSIS — R29898 Other symptoms and signs involving the musculoskeletal system: Secondary | ICD-10-CM

## 2020-12-31 DIAGNOSIS — I63511 Cerebral infarction due to unspecified occlusion or stenosis of right middle cerebral artery: Secondary | ICD-10-CM

## 2020-12-31 NOTE — Therapy (Signed)
[place] MAIN [facility] SERVICES [address] [place], [place], [address] Phone: [phone]   Fax:  [phone]  Physical Therapy Treatment  Patient Details  Name: Jose Castro MRN: [id number] Date of Birth: Sep 04, 1926 Referring Provider (PT): Dr. Ancil Boozer   Encounter Date: 12/31/2020   PT End of Session - 12/31/20 1703    Visit Number 19    Number of Visits 25    Date for PT Re-Evaluation 01/10/21    Authorization Type eval: 09/20/20    PT Start Time 1440    PT Stop Time 1512    PT Time Calculation (min) 32 min    Equipment Utilized During Treatment Gait belt    Activity Tolerance Patient tolerated treatment well;Patient limited by fatigue;Patient limited by pain    Behavior During Therapy [facility] for tasks assessed/performed           Past Medical History:  Diagnosis Date  . Allergy    Penicillin  . Arthritis   . Basal cell carcinoma   . BPH (benign prostatic hyperplasia)   . Chronic kidney disease    had a kidney stone which per family was a cyst that was removed  . Dysrhythmia   . GERD (gastroesophageal reflux disease)   . History of kidney stones   . Hyperlipidemia   . Hypertension   . Left-sided low back pain with left-sided sciatica   . Lung mass   . Splenic vein thrombosis   . Stroke ([place]) 05/22/2019  . Thrombosis 12/2015   mural  area and no notation of heart attack    Past Surgical History:  Procedure Laterality Date  . APPENDECTOMY    . CATARACT EXTRACTION, BILATERAL    . CYSTOSCOPY WITH INSERTION OF UROLIFT    . ENDOSCOPIC RETROGRADE CHOLANGIOPANCREATOGRAPHY (ERCP) WITH PROPOFOL N/A 05/11/2020   Procedure: ENDOSCOPIC RETROGRADE CHOLANGIOPANCREATOGRAPHY (ERCP) WITH PROPOFOL;  Surgeon: [name], MD;  Location: [facility] ENDOSCOPY;  Service: Endoscopy;  Laterality: N/A;  . ERCP N/A 10/23/2020   Procedure: ENDOSCOPIC RETROGRADE CHOLANGIOPANCREATOGRAPHY (ERCP);  Surgeon: [name], MD;  Location: [facility] ENDOSCOPY;   Service: Endoscopy;  Laterality: N/A;  . EYE SURGERY  08/2018  . IR EXCHANGE BILIARY DRAIN  05/04/2020  . KIDNEY STONE SURGERY    . KNEE SURGERY    . PACEMAKER INSERTION N/A 07/13/2018   Procedure: INSERTION PACEMAKER-DUEL CHAMBER INITIAL IMPLANT;  Surgeon: Isaias Cowman, MD;  Location: [facility] ORS;  Service: Cardiovascular;  Laterality: N/A;  . SPINE SURGERY      There were no vitals filed for this visit.   Subjective Assessment - 12/31/20 1700    Subjective Patient reports no new changes. Reports he is cancer-free per his MD visit last week.    Patient is accompained by: Family member    Pertinent History Pt referred for deconditioning and gait instability. He has had a decline since he last finished therapy. Pt had a hospital admission from 05/02/20 to 05/14/20 for acute cholecystitis and is now s/p cholecystostomy drain placement. Drain is still in place and pt is waiting to hear when they plan to remove it. Pt discharged to SNF and then returned home. In addition pt underwent radiation therapy for a RUL lung mass/cancer and had his last radiation treatment in September of 2021. He reports that he has continued to walk at home as his primary exercise. Pt was previously seen at this clinic for PT/OT due to weakness/imbalance s/p CVA. Prior history from 09/21/19: Pt is a 87 year old male who presents  with imbalance and difficulty with gait following a right frontal CVA with left hemiparesis on 05/22/19.  He has completed inpatient and home health PT, and now presents for OP PT.  He was discharged from inpatient rehab at a supervision level of assistance, ambulating with a RW.  His family notes that he is impulsive and will try to ambulate without his RW at home.  Family is currently providing 24/7 supervision.    Limitations House hold activities;Walking    How long can you sit comfortably? no limitations    How long can you walk comfortably? he requires a RW    Patient Stated Goals Pt wants to be  able to walk his granddaughter down the aisle at her wedding November 2022    Currently in Pain? No/denies    Pain Onset More than a month ago           * Patient was 10 min late for appointment.       Leg press machine: 55#  2 sets of 12 reps with minimal verbal cues to perform slowly.  Therex:   Side stepping in //bars  Left to right then back x 4 trials.  Standing heel raises x 20 reps  Standing mini squats with unilateral UE support x 20 reps  Step overs x orange hurdles x unilateral UE support x forward/sideways x 10 reps each leg   Sit to Stand from edge of mat without BUE support  From varying mat surface height  25 in high= 3 reps without BUE support x 3 23 in high x 10 reps, without BUE support x 3 21 in (base height of mat table) without BUE x 3  19.5 in (base height of straight back chair) without BLE x 3  Pt educated throughout session about proper posture and technique with exercises. Improved exercise technique, movement at target joints, use of target muscles after min to mod verbal, visual, tactile cues.      Gait training: ambulated on level surface for 300 ft requiring SBA with initially  decreased L knee extension and L ankle DF - requiring verbal cues to increase step length and heel strike. Patient able to respond with improved gait quality and gait speed measured at 0.61 m/s.                               PT Education - 12/31/20 1702    Education Details verbal cues for transfer technique. Visual demonstration for exercise technique    Person(s) Educated Patient    Methods Explanation;Demonstration;Tactile cues;Verbal cues    Comprehension Verbalized understanding;Returned demonstration;Verbal cues required;Tactile cues required;Need further instruction            PT Short Term Goals - 12/13/20 1210      PT SHORT TERM GOAL #1   Title Pt will be independent with HEP in order to improve strength and balance in order to  decrease fall risk and improve function at home.    Time 6    Period Weeks    Status On-going    Target Date 01/10/21             PT Long Term Goals - 12/13/20 1210      PT LONG TERM GOAL #1   Title Pt will improve BERG by at least 3 points in order to demonstrate clinically significant improvement in balance.    Baseline 09/20/20: 32/56 (Previous discharge: 03/06/20: 46/56), 12/14:  32/56, 1/13: 31/56    Time 4    Period Weeks    Status Not Met    Target Date 01/10/21      PT LONG TERM GOAL #2   Title Pt will decrease TUG to below 14 seconds/decrease in order to demonstrate decreased fall risk.    Baseline 09/20/20: 19.2s, 12/14: 17.48, 1/13: 16.65 sec    Time 4    Period Weeks    Status Partially Met    Target Date 01/10/21      PT LONG TERM GOAL #3   Title Pt will decrease 5TSTS by at least 3 seconds in order to demonstrate clinically significant improvement in LE strength.    Baseline 09/20/20: 23.1s with minA+1 for strength and balance (Previous discharge 03/06/20: 16.1s), 12/14: 20 sec, 1/13: 22 sec    Time 4    Period Weeks    Status Partially Met    Target Date 01/10/21      PT LONG TERM GOAL #4   Title Pt will increase his self-selected 10MWT to >0.74 m/s in order to demonstrate improvement in gait speed and improved community ambulation    Baseline 09/20/20: self-selected: 14.4s = 0.69 m/s with rollator (Previous discharge 03/06/20: self-selected: 13.6s = 0.74 m/s), 12/14: 0.75 m/s, 1/13: 0.78 m/s    Time 4    Period Weeks    Status Partially Met    Target Date 01/10/21                 Plan - 12/31/20 1712    Clinical Impression Statement Patient able to progress sit to stand ability using only LE's for improved strength today with cueing and practice. He was able to improve his gait quality with improved step length and decreased left foot drag during gait and neuro-re ed activities.Patient will continue to benefit from skilled physical therapy to improve  generalized strength, ROM, and capacity for functional activity.    Personal Factors and Comorbidities Age;Comorbidity 3+    Comorbidities HTN, CVA, cholecystitis with cholecystostomy drain    Examination-Activity Limitations Bend;Lift;Squat;Locomotion Level;Stairs;Stand    Examination-Participation Restrictions Community Activity;Driving;Laundry;Meal Prep;Shop    Stability/Clinical Decision Making Unstable/Unpredictable    Rehab Potential Fair    PT Frequency 2x / week    PT Duration 4 weeks    PT Treatment/Interventions ADLs/Self Care Home Management;Canalith Repostioning;Cryotherapy;Electrical Stimulation;Iontophoresis 79m/ml Dexamethasone;Moist Heat;Traction;Ultrasound;DME Instruction;Gait training;Stair training;Therapeutic activities;Therapeutic exercise;Functional mobility training;Balance training;Neuromuscular re-education;Patient/family education;Manual techniques;Dry needling;Vestibular;Joint Manipulations;Spinal Manipulations;Aquatic Therapy    PT Next Visit Plan Continue to focus on LE strengthening and balance    PT Home Exercise Plan advance as tolerated;    Consulted and Agree with Plan of Care Patient    Family Member Consulted granddaughter           Patient will benefit from skilled therapeutic intervention in order to improve the following deficits and impairments:  Abnormal gait,Decreased balance,Decreased mobility,Difficulty walking,Decreased knowledge of precautions,Decreased safety awareness,Decreased activity tolerance,Decreased strength,Decreased endurance  Visit Diagnosis: Muscle weakness (generalized)  Unsteadiness on feet  Decreased strength of lower extremity  Other lack of coordination  Other abnormalities of gait and mobility  Right middle cerebral artery stroke (HCC)  Great toe pain, right  Lethargy     Problem List Patient Active Problem List   Diagnosis Date Noted  . Aneurysm, aorta, thoracic ([place] 12/21/2020  . Calculus of bile duct  without cholecystitis and without obstruction   . Non-small cell carcinoma of lung, right ([place] 09/07/2020  . Mass of upper lobe of  right lung 07/13/2020  . Calculus of bile duct with cholecystitis without obstruction   . Abnormal findings on imaging of biliary tract   . Acute cholecystitis 05/02/2020  . Chronic venous insufficiency 12/05/2019  . 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 CKD (chronic kidney disease), stage II   . New onset atrial fibrillation ([place])   . Right middle cerebral artery stroke ([place]) 05/27/2019  . Goals of care, counseling/discussion   . Palliative care by specialist   . DNR (do not resuscitate) discussion   . History of CVA (cerebrovascular accident) 05/22/2019  . Persistent proteinuria 07/30/2018  . 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 Chronic kidney disease, stage III (moderate) ([place]) 06/15/2018  . Leg pain 05/06/2018  . Lymphedema 05/06/2018  . Coagulopathy ([place]) 07/07/2017  . Lumbar spondylosis 03/10/2017  . Elevated uric acid in blood 11/20/2016  . BPH (benign prostatic hyperplasia) 05/29/2016  . Peripheral vascular disease of lower extremity ([employer]) 03/03/2016  . Gallstone 12/31/2015  . 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 Atherosclerosis of aorta ([place]) 11/22/2015  . Carotid artery narrowing 11/22/2015  . Diverticulosis of colon 11/22/2015  . Decreased creatinine clearance 11/22/2015  . Arthritis, degenerative 11/22/2015  . Lactose intolerance 11/22/2015  . Basal cell carcinoma 11/22/2015  . Essential hypertension 06/21/2015  . Hyperlipemia 06/21/2015  . GERD (gastroesophageal reflux disease) 06/21/2015  . Calculus of kidney 11/18/2013    Lewis Moccasin, PT 12/31/2020, 5:16 PM  [place] MAIN [facility] SERVICES [address] [place], [place], [address] Phone: [phone]   Fax:  [phone]  Name: Jose Castro MRN: [id number] Date of Birth:  1926-10-09

## 2021-01-02 ENCOUNTER — Ambulatory Visit: Payer: Medicare Other | Attending: Family Medicine

## 2021-01-02 ENCOUNTER — Other Ambulatory Visit: Payer: Self-pay

## 2021-01-02 DIAGNOSIS — R2681 Unsteadiness on feet: Secondary | ICD-10-CM | POA: Diagnosis present

## 2021-01-02 DIAGNOSIS — R29898 Other symptoms and signs involving the musculoskeletal system: Secondary | ICD-10-CM | POA: Diagnosis present

## 2021-01-02 DIAGNOSIS — R278 Other lack of coordination: Secondary | ICD-10-CM | POA: Diagnosis present

## 2021-01-02 DIAGNOSIS — I63511 Cerebral infarction due to unspecified occlusion or stenosis of right middle cerebral artery: Secondary | ICD-10-CM | POA: Insufficient documentation

## 2021-01-02 DIAGNOSIS — M6281 Muscle weakness (generalized): Secondary | ICD-10-CM | POA: Insufficient documentation

## 2021-01-02 DIAGNOSIS — R2689 Other abnormalities of gait and mobility: Secondary | ICD-10-CM | POA: Insufficient documentation

## 2021-01-02 NOTE — Therapy (Signed)
[facility] MAIN [facility] SERVICES [address] [place], [place], [address] Phone: [phone]   Fax:  [phone]  Physical Therapy Treatment/Physical Therapy Progress Note   Dates of reporting period 11/13/2020   to   01/02/2021  Patient Details  Name: Jose Castro MRN: [id number] Date of Birth: 1926/04/15 Referring Provider (PT): Dr. Carlynn Purl   Encounter Date: 01/02/2021    Past Medical History:  Diagnosis Date  . Allergy    Penicillin  . Arthritis   . Basal cell carcinoma   . BPH (benign prostatic hyperplasia)   . Chronic kidney disease    had a kidney stone which per family was a cyst that was removed  . Dysrhythmia   . GERD (gastroesophageal reflux disease)   . History of kidney stones   . Hyperlipidemia   . Hypertension   . Left-sided low back pain with left-sided sciatica   . Lung mass   . Splenic vein thrombosis   . Stroke (HCC) 05/22/2019  . Thrombosis 12/2015   mural  area and no notation of heart attack    Past Surgical History:  Procedure Laterality Date  . APPENDECTOMY    . CATARACT EXTRACTION, BILATERAL    . CYSTOSCOPY WITH INSERTION OF UROLIFT    . ENDOSCOPIC RETROGRADE CHOLANGIOPANCREATOGRAPHY (ERCP) WITH PROPOFOL N/A 05/11/2020   Procedure: ENDOSCOPIC RETROGRADE CHOLANGIOPANCREATOGRAPHY (ERCP) WITH PROPOFOL;  Surgeon: Midge Minium, MD;  Location: [facility] ENDOSCOPY;  Service: Endoscopy;  Laterality: N/A;  . ERCP N/A 10/23/2020   Procedure: ENDOSCOPIC RETROGRADE CHOLANGIOPANCREATOGRAPHY (ERCP);  Surgeon: Midge Minium, MD;  Location: [facility] ENDOSCOPY;  Service: Endoscopy;  Laterality: N/A;  . EYE SURGERY  08/2018  . IR EXCHANGE BILIARY DRAIN  05/04/2020  . KIDNEY STONE SURGERY    . KNEE SURGERY    . PACEMAKER INSERTION N/A 07/13/2018   Procedure: INSERTION PACEMAKER-DUEL CHAMBER INITIAL IMPLANT;  Surgeon: Marcina Millard, MD;  Location: [facility] ORS;  Service: Cardiovascular;  Laterality: N/A;  . SPINE SURGERY      There  were no vitals filed for this visit.    Patient reassessed today- See STG and LTG for details  Added step over cones in // bars  - verbal cues to "step over" not "around" the cones - forward and backward over the cone x 20 reps x 2 trials.                             PT Short Term Goals - 12/13/20 1210      PT SHORT TERM GOAL #1   Title Pt will be independent with HEP in order to improve strength and balance in order to decrease fall risk and improve function at home.    Time 6    Period Weeks    Status On-going    Target Date 01/10/21             PT Long Term Goals - 12/13/20 1210      PT LONG TERM GOAL #1   Title Pt will improve BERG by at least 3 points in order to demonstrate clinically significant improvement in balance.    Baseline 09/20/20: 32/56 (Previous discharge: 03/06/20: 46/56), 12/14: 32/56, 1/13: 31/56    Time 4    Period Weeks    Status Not Met    Target Date 01/10/21      PT LONG TERM GOAL #2   Title Pt will decrease TUG to below 14 seconds/decrease in order  to demonstrate decreased fall risk.    Baseline 09/20/20: 19.2s, 12/14: 17.48, 1/13: 16.65 sec    Time 4    Period Weeks    Status Partially Met    Target Date 01/10/21      PT LONG TERM GOAL #3   Title Pt will decrease 5TSTS by at least 3 seconds in order to demonstrate clinically significant improvement in LE strength.    Baseline 09/20/20: 23.1s with minA+1 for strength and balance (Previous discharge 03/06/20: 16.1s), 12/14: 20 sec, 1/13: 22 sec    Time 4    Period Weeks    Status Partially Met    Target Date 01/10/21      PT LONG TERM GOAL #4   Title Pt will increase his self-selected 10MWT to >0.74 m/s in order to demonstrate improvement in gait speed and improved community ambulation    Baseline 09/20/20: self-selected: 14.4s = 0.69 m/s with rollator (Previous discharge 03/06/20: self-selected: 13.6s = 0.74 m/s), 12/14: 0.75 m/s, 1/13: 0.78 m/s    Time 4    Period  Weeks    Status Partially Met    Target Date 01/10/21                  Patient will benefit from skilled therapeutic intervention in order to improve the following deficits and impairments:     Visit Diagnosis: No diagnosis found.     Problem List Patient Active Problem List   Diagnosis Date Noted  . Aneurysm, aorta, thoracic ([place]) 12/21/2020  . Calculus of bile duct without cholecystitis and without obstruction   . Non-small cell carcinoma of lung, right ([place]) 09/07/2020  . Mass of upper lobe of right lung 07/13/2020  . Calculus of bile duct with cholecystitis without obstruction   . Abnormal findings on imaging of biliary tract   . Acute cholecystitis 05/02/2020  . Chronic venous insufficiency 12/05/2019  . 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 CKD (chronic kidney disease), stage II   . New onset atrial fibrillation ([place])   . Right middle cerebral artery stroke ([place]) 05/27/2019  . Goals of care, counseling/discussion   . Palliative care by specialist   . DNR (do not resuscitate) discussion   . History of CVA (cerebrovascular accident) 05/22/2019  . Persistent proteinuria 07/30/2018  . 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 Chronic kidney disease, stage III (moderate) ([place]) 06/15/2018  . Leg pain 05/06/2018  . Lymphedema 05/06/2018  . Coagulopathy ([place]) 07/07/2017  . Lumbar spondylosis 03/10/2017  . Elevated uric acid in blood 11/20/2016  . BPH (benign prostatic hyperplasia) 05/29/2016  . Peripheral vascular disease of lower extremity ([place]) 03/03/2016  . Gallstone 12/31/2015  . 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 Atherosclerosis of aorta ([place]) 11/22/2015  . Carotid artery narrowing 11/22/2015  . Diverticulosis of colon 11/22/2015  . Decreased creatinine clearance 11/22/2015  . Arthritis, degenerative 11/22/2015  . Lactose intolerance 11/22/2015  . Basal cell carcinoma 11/22/2015  . Essential  hypertension 06/21/2015  . Hyperlipemia 06/21/2015  . GERD (gastroesophageal reflux disease) 06/21/2015  . Calculus of kidney 11/18/2013    Lewis Moccasin, PT 01/02/2021, 1:56 PM  [place] MAIN [facility] SERVICES [address] [place], [place], [address] Phone: [phone]   Fax:  [phone]  Name: LODEN LAURENT MRN: [id number] Date of Birth: 02/23/1926

## 2021-01-07 ENCOUNTER — Other Ambulatory Visit: Payer: Self-pay

## 2021-01-07 ENCOUNTER — Ambulatory Visit: Payer: Medicare Other

## 2021-01-07 DIAGNOSIS — I63511 Cerebral infarction due to unspecified occlusion or stenosis of right middle cerebral artery: Secondary | ICD-10-CM

## 2021-01-07 DIAGNOSIS — R2681 Unsteadiness on feet: Secondary | ICD-10-CM

## 2021-01-07 DIAGNOSIS — M6281 Muscle weakness (generalized): Secondary | ICD-10-CM

## 2021-01-07 DIAGNOSIS — R278 Other lack of coordination: Secondary | ICD-10-CM

## 2021-01-07 DIAGNOSIS — R2689 Other abnormalities of gait and mobility: Secondary | ICD-10-CM

## 2021-01-07 DIAGNOSIS — R29898 Other symptoms and signs involving the musculoskeletal system: Secondary | ICD-10-CM

## 2021-01-07 NOTE — Therapy (Signed)
[place] MAIN [facility] SERVICES [address] [place], [place], [address] Phone: [phone]   Fax:  [phone]  Physical Therapy Treatment  Patient Details  Name: Jose Castro MRN: [id number] Date of Birth: Jul 29, 1926 Referring Provider (PT): Dr. Ancil Boozer   Encounter Date: 01/07/2021   PT End of Session - 01/07/21 1359    Visit Number 21    Number of Visits 25    Date for PT Re-Evaluation 01/10/21    Authorization Type eval: 09/20/20, Progress note 01/02/2021    PT Start Time 0147    PT Stop Time 0227    PT Time Calculation (min) 40 min    Equipment Utilized During Treatment Gait belt    Activity Tolerance Patient tolerated treatment well;Patient limited by fatigue;Patient limited by pain    Behavior During Therapy [facility] for tasks assessed/performed           Past Medical History:  Diagnosis Date  . Allergy    Penicillin  . Arthritis   . Basal cell carcinoma   . BPH (benign prostatic hyperplasia)   . Chronic kidney disease    had a kidney stone which per family was a cyst that was removed  . Dysrhythmia   . GERD (gastroesophageal reflux disease)   . History of kidney stones   . Hyperlipidemia   . Hypertension   . Left-sided low back pain with left-sided sciatica   . Lung mass   . Splenic vein thrombosis   . Stroke ([place]) 05/22/2019  . Thrombosis 12/2015   mural  area and no notation of heart attack    Past Surgical History:  Procedure Laterality Date  . APPENDECTOMY    . CATARACT EXTRACTION, BILATERAL    . CYSTOSCOPY WITH INSERTION OF UROLIFT    . ENDOSCOPIC RETROGRADE CHOLANGIOPANCREATOGRAPHY (ERCP) WITH PROPOFOL N/A 05/11/2020   Procedure: ENDOSCOPIC RETROGRADE CHOLANGIOPANCREATOGRAPHY (ERCP) WITH PROPOFOL;  Surgeon: [name], MD;  Location: [facility] ENDOSCOPY;  Service: Endoscopy;  Laterality: N/A;  . ERCP N/A 10/23/2020   Procedure: ENDOSCOPIC RETROGRADE CHOLANGIOPANCREATOGRAPHY (ERCP);  Surgeon: [name], MD;  Location:  [facility] ENDOSCOPY;  Service: Endoscopy;  Laterality: N/A;  . EYE SURGERY  08/2018  . IR EXCHANGE BILIARY DRAIN  05/04/2020  . KIDNEY STONE SURGERY    . KNEE SURGERY    . PACEMAKER INSERTION N/A 07/13/2018   Procedure: INSERTION PACEMAKER-DUEL CHAMBER INITIAL IMPLANT;  Surgeon: Isaias Cowman, MD;  Location: [facility] ORS;  Service: Cardiovascular;  Laterality: N/A;  . SPINE SURGERY      There were no vitals filed for this visit.   Subjective Assessment - 01/07/21 1355    Subjective The patient reports he is doing well, no complaints.  Patient reports his balance has been ok at home.    Patient is accompained by: Family member    Pertinent History Pt referred for deconditioning and gait instability. He has had a decline since he last finished therapy. Pt had a hospital admission from 05/02/20 to 05/14/20 for acute cholecystitis and is now s/p cholecystostomy drain placement. Drain is still in place and pt is waiting to hear when they plan to remove it. Pt discharged to SNF and then returned home. In addition pt underwent radiation therapy for a RUL lung mass/cancer and had his last radiation treatment in September of 2021. He reports that he has continued to walk at home as his primary exercise. Pt was previously seen at this clinic for PT/OT due to weakness/imbalance s/p CVA. Prior history from 09/21/19:  Pt is a 87 year old male who presents with imbalance and difficulty with gait following a right frontal CVA with left hemiparesis on 05/22/19.  He has completed inpatient and home health PT, and now presents for OP PT.  He was discharged from inpatient rehab at a supervision level of assistance, ambulating with a RW.  His family notes that he is impulsive and will try to ambulate without his RW at home.  Family is currently providing 24/7 supervision.    Limitations House hold activities;Walking    How long can you sit comfortably? no limitations    How long can you walk comfortably? he requires a RW     Patient Stated Goals Pt wants to be able to walk his granddaughter down the aisle at her wedding November 2022    Currently in Pain? No/denies    Pain Score 0-No pain    Pain Onset More than a month ago           Nu-Step x L1 x 5 mins; arms 10, seat 9   Therex:    Standing heel raises x 20 reps  Step overs x orange hurdles x unilateral UE support x forward/sideways x 10 reps each leg    Sit to stands with BUE support x 10 reps, without BUE support x 5 reps with minA    Neuro Re-ed: KORE balance x penguin race x multiple reps to facilitate better weight bearing through LLE  Gait training: ambulated on level surface for 300 ft requiring SBA with decreased L knee extension and L ankle DF                             PT Short Term Goals - 01/03/21 1138      PT SHORT TERM GOAL #1   Title Pt will be independent with HEP in order to improve strength and balance in order to decrease fall risk and improve function at home.    Baseline 01/02/2021- Patient verbalize understanding of walking and seated exercises for home program.    Time 6    Period Weeks    Status Achieved    Target Date 01/10/21             PT Long Term Goals - 01/02/21 1403      PT LONG TERM GOAL #1   Title Pt will improve BERG by at least 3 points in order to demonstrate clinically significant improvement in balance.    Baseline 09/20/20: 32/56 (Previous discharge: 03/06/20: 46/56), 12/14: 32/56, 1/13: 31/56    Time 4    Period Weeks    Status Not Met      PT LONG TERM GOAL #2   Title Pt will decrease TUG to below 14 seconds/decrease in order to demonstrate decreased fall risk.    Baseline 09/20/20: 19.2s, 12/14: 17.48, 1/13: 16.65 sec. 01/02/2021=15.75 sec using 4WW    Time 4    Period Weeks    Status On-going      PT LONG TERM GOAL #3   Title Pt will decrease 5TSTS by at least 3 seconds in order to demonstrate clinically significant improvement in LE strength.    Baseline  09/20/20: 23.1s with minA+1 for strength and balance (Previous discharge 03/06/20: 16.1s), 12/14: 20 sec, 1/13: 22 sec.   01/02/2021= 16.05 sec with light UE support.    Time 4    Period Weeks    Status On-going  PT LONG TERM GOAL #4   Title Pt will increase his self-selected 10MWT to >0.74 m/s in order to demonstrate improvement in gait speed and improved community ambulation    Baseline 09/20/20: self-selected: 14.4s = 0.69 m/s with rollator (Previous discharge 03/06/20: self-selected: 13.6s = 0.74 m/s), 12/14: 0.75 m/s, 1/13: 0.78 m/s. 01/02/2021= 0.85 m/s using 4WW    Time 4    Period Weeks    Status On-going                 Plan - 01/07/21 1440    Clinical Impression Statement Patient required mild cueing for posture.  The patient presents with difficulty performing sit to stand without UE support requiring min A with attempts.  Patient is able to easily perform sit to stand transfers with supervision and bilateral UE support.  The patient continues to benefit from skilled PT services to improve LE strength and balance for improved quality of life.    Personal Factors and Comorbidities Age;Comorbidity 3+    Comorbidities HTN, CVA, cholecystitis with cholecystostomy drain    Examination-Activity Limitations Bend;Lift;Squat;Locomotion Level;Stairs;Stand    Examination-Participation Restrictions Community Activity;Driving;Laundry;Meal Prep;Shop    Stability/Clinical Decision Making Unstable/Unpredictable    Rehab Potential Fair    PT Frequency 2x / week    PT Duration 4 weeks    PT Treatment/Interventions ADLs/Self Care Home Management;Canalith Repostioning;Cryotherapy;Electrical Stimulation;Iontophoresis 84m/ml Dexamethasone;Moist Heat;Traction;Ultrasound;DME Instruction;Gait training;Stair training;Therapeutic activities;Therapeutic exercise;Functional mobility training;Balance training;Neuromuscular re-education;Patient/family education;Manual techniques;Dry  needling;Vestibular;Joint Manipulations;Spinal Manipulations;Aquatic Therapy    PT Next Visit Plan Continue to focus on LE strengthening and balance    PT Home Exercise Plan advance as tolerated;    Consulted and Agree with Plan of Care Patient    Family Member Consulted granddaughter           Patient will benefit from skilled therapeutic intervention in order to improve the following deficits and impairments:  Abnormal gait,Decreased balance,Decreased mobility,Difficulty walking,Decreased knowledge of precautions,Decreased safety awareness,Decreased activity tolerance,Decreased strength,Decreased endurance  Visit Diagnosis: Muscle weakness (generalized)  Unsteadiness on feet  Decreased strength of lower extremity  Other lack of coordination  Other abnormalities of gait and mobility  Right middle cerebral artery stroke ([facility]     Problem List Patient Active Problem List   Diagnosis Date Noted  . Aneurysm, aorta, thoracic ([place] 12/21/2020  . Calculus of bile duct without cholecystitis and without obstruction   . Non-small cell carcinoma of lung, right ([place] 09/07/2020  . Mass of upper lobe of right lung 07/13/2020  . Calculus of bile duct with cholecystitis without obstruction   . Abnormal findings on imaging of biliary tract   . Acute cholecystitis 05/02/2020  . Chronic venous insufficiency 12/05/2019  . 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 CKD (chronic kidney disease), stage II   . New onset atrial fibrillation ([place]   . Right middle cerebral artery stroke ([place] 05/27/2019  . Goals of care, counseling/discussion   . Palliative care by specialist   . DNR (do not resuscitate) discussion   . History of CVA (cerebrovascular accident) 05/22/2019  . Persistent proteinuria 07/30/2018  . 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 Chronic kidney disease, stage III (moderate) ([facility] 06/15/2018  . Leg pain 05/06/2018  . Lymphedema 05/06/2018  .  Coagulopathy ([place] 07/07/2017  . Lumbar spondylosis 03/10/2017  . Elevated uric acid in blood 11/20/2016  . BPH (benign prostatic hyperplasia) 05/29/2016  . Peripheral vascular disease of lower extremity ([place] 03/03/2016  . Gallstone  12/31/2015  . 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 Atherosclerosis of aorta ([place]) 11/22/2015  . Carotid artery narrowing 11/22/2015  . Diverticulosis of colon 11/22/2015  . Decreased creatinine clearance 11/22/2015  . Arthritis, degenerative 11/22/2015  . Lactose intolerance 11/22/2015  . Basal cell carcinoma 11/22/2015  . Essential hypertension 06/21/2015  . Hyperlipemia 06/21/2015  . GERD (gastroesophageal reflux disease) 06/21/2015  . Calculus of kidney 11/18/2013    Hal Morales PT, DPT 01/07/2021, 2:48 PM  [place] MAIN [facility] SERVICES [address] [place], [place], [address] Phone: [phone]   Fax:  [phone]  Name: Jose Castro MRN: [id number] Date of Birth: 26-Dec-1925

## 2021-01-09 ENCOUNTER — Ambulatory Visit: Payer: Medicare Other

## 2021-01-09 ENCOUNTER — Other Ambulatory Visit: Payer: Self-pay

## 2021-01-09 DIAGNOSIS — R2681 Unsteadiness on feet: Secondary | ICD-10-CM

## 2021-01-09 DIAGNOSIS — R2689 Other abnormalities of gait and mobility: Secondary | ICD-10-CM

## 2021-01-09 DIAGNOSIS — M6281 Muscle weakness (generalized): Secondary | ICD-10-CM

## 2021-01-09 DIAGNOSIS — R278 Other lack of coordination: Secondary | ICD-10-CM

## 2021-01-09 DIAGNOSIS — R29898 Other symptoms and signs involving the musculoskeletal system: Secondary | ICD-10-CM

## 2021-01-09 DIAGNOSIS — I63511 Cerebral infarction due to unspecified occlusion or stenosis of right middle cerebral artery: Secondary | ICD-10-CM

## 2021-01-09 NOTE — Therapy (Signed)
[place] MAIN [facility] SERVICES [address] [place], [place], [address] Phone: [phone]   Fax:  [phone]  Physical Therapy Treatment  Patient Details  Name: Jose Castro MRN: [id number] Date of Birth: 1926/04/15 Referring Provider (PT): Dr. Ancil Boozer   Encounter Date: 01/09/2021   PT End of Session - 01/09/21 1409    Visit Number 22    Number of Visits 25    Date for PT Re-Evaluation 01/10/21    Authorization Type eval: 09/20/20, Progress note 01/02/2021    PT Start Time 0205    PT Stop Time 0243    PT Time Calculation (min) 38 min    Equipment Utilized During Treatment Gait belt    Activity Tolerance Patient tolerated treatment well;Patient limited by fatigue;Patient limited by pain    Behavior During Therapy [facility] for tasks assessed/performed           Past Medical History:  Diagnosis Date  . Allergy    Penicillin  . Arthritis   . Basal cell carcinoma   . BPH (benign prostatic hyperplasia)   . Chronic kidney disease    had a kidney stone which per family was a cyst that was removed  . Dysrhythmia   . GERD (gastroesophageal reflux disease)   . History of kidney stones   . Hyperlipidemia   . Hypertension   . Left-sided low back pain with left-sided sciatica   . Lung mass   . Splenic vein thrombosis   . Stroke ([place]) 05/22/2019  . Thrombosis 12/2015   mural  area and no notation of heart attack    Past Surgical History:  Procedure Laterality Date  . APPENDECTOMY    . CATARACT EXTRACTION, BILATERAL    . CYSTOSCOPY WITH INSERTION OF UROLIFT    . ENDOSCOPIC RETROGRADE CHOLANGIOPANCREATOGRAPHY (ERCP) WITH PROPOFOL N/A 05/11/2020   Procedure: ENDOSCOPIC RETROGRADE CHOLANGIOPANCREATOGRAPHY (ERCP) WITH PROPOFOL;  Surgeon: [name], MD;  Location: [facility] ENDOSCOPY;  Service: Endoscopy;  Laterality: N/A;  . ERCP N/A 10/23/2020   Procedure: ENDOSCOPIC RETROGRADE CHOLANGIOPANCREATOGRAPHY (ERCP);  Surgeon: [name], MD;  Location:  [facility] ENDOSCOPY;  Service: Endoscopy;  Laterality: N/A;  . EYE SURGERY  08/2018  . IR EXCHANGE BILIARY DRAIN  05/04/2020  . KIDNEY STONE SURGERY    . KNEE SURGERY    . PACEMAKER INSERTION N/A 07/13/2018   Procedure: INSERTION PACEMAKER-DUEL CHAMBER INITIAL IMPLANT;  Surgeon: Isaias Cowman, MD;  Location: [facility] ORS;  Service: Cardiovascular;  Laterality: N/A;  . SPINE SURGERY      There were no vitals filed for this visit.   Subjective Assessment - 01/09/21 1408    Subjective Patient reports his knee is not painful just annoying.  Patient reports his balance has continued ot be good at home.  Patient 20 mins late due to caregiver being late.    Patient is accompained by: Family member    Pertinent History Pt referred for deconditioning and gait instability. He has had a decline since he last finished therapy. Pt had a hospital admission from 05/02/20 to 05/14/20 for acute cholecystitis and is now s/p cholecystostomy drain placement. Drain is still in place and pt is waiting to hear when they plan to remove it. Pt discharged to SNF and then returned home. In addition pt underwent radiation therapy for a RUL lung mass/cancer and had his last radiation treatment in September of 2021. He reports that he has continued to walk at home as his primary exercise. Pt was previously seen at this  clinic for PT/OT due to weakness/imbalance s/p CVA. Prior history from 09/21/19: Pt is a 87 year old male who presents with imbalance and difficulty with gait following a right frontal CVA with left hemiparesis on 05/22/19.  He has completed inpatient and home health PT, and now presents for OP PT.  He was discharged from inpatient rehab at a supervision level of assistance, ambulating with a RW.  His family notes that he is impulsive and will try to ambulate without his RW at home.  Family is currently providing 24/7 supervision.    Limitations House hold activities;Walking    How long can you sit comfortably? no  limitations    How long can you walk comfortably? he requires a RW    Patient Stated Goals Pt wants to be able to walk his granddaughter down the aisle at her wedding November 2022    Currently in Pain? No/denies    Pain Score 0-No pain    Pain Onset More than a month ago            Nu-Step x L1 x 5 mins; arms 10, seat 9  6 min walk test- 620 ft with rollator supervision to CGA, left LE decreased step length, occasional dragging left LE and required verbal cues to "pick up" left foot.     Therex:   Standing heel raises x 20 reps  Step overs x orange hurdles x unilateral UE support x forward/sideways x 10 reps each leg    Sit to stands without bilateral UE support 2x5 min-mod A  Neuro Re-ed: Airex beam forward/retro walking x3 laps Airex beam side stepping x3 laps                          PT Education - 01/09/21 1426    Education Details exercise technique, 6 min walk test results    Person(s) Educated Patient    Methods Explanation    Comprehension Verbalized understanding            PT Short Term Goals - 01/03/21 1138      PT SHORT TERM GOAL #1   Title Pt will be independent with HEP in order to improve strength and balance in order to decrease fall risk and improve function at home.    Baseline 01/02/2021- Patient verbalize understanding of walking and seated exercises for home program.    Time 6    Period Weeks    Status Achieved    Target Date 01/10/21             PT Long Term Goals - 01/09/21 1422      PT LONG TERM GOAL #1   Title Pt will improve BERG by at least 3 points in order to demonstrate clinically significant improvement in balance.    Baseline 09/20/20: 32/56 (Previous discharge: 03/06/20: 46/56), 12/14: 32/56, 1/13: 31/56    Time 4    Period Weeks    Status Not Met      PT LONG TERM GOAL #2   Title Pt will decrease TUG to below 14 seconds/decrease in order to demonstrate decreased fall risk.    Baseline 09/20/20: 19.2s,  12/14: 17.48, 1/13: 16.65 sec. 01/02/2021=15.75 sec using 4WW    Time 4    Period Weeks    Status On-going      PT LONG TERM GOAL #3   Title Pt will decrease 5TSTS by at least 3 seconds in order to demonstrate clinically significant improvement in  LE strength.    Baseline 09/20/20: 23.1s with minA+1 for strength and balance (Previous discharge 03/06/20: 16.1s), 12/14: 20 sec, 1/13: 22 sec.   01/02/2021= 16.05 sec with light UE support.    Time 4    Period Weeks    Status On-going      PT LONG TERM GOAL #4   Title Pt will increase his self-selected 10MWT to >0.74 m/s in order to demonstrate improvement in gait speed and improved community ambulation    Baseline 09/20/20: self-selected: 14.4s = 0.69 m/s with rollator (Previous discharge 03/06/20: self-selected: 13.6s = 0.74 m/s), 12/14: 0.75 m/s, 1/13: 0.78 m/s. 01/02/2021= 0.85 m/s using 4WW    Time 4    Period Weeks    Status On-going      PT LONG TERM GOAL #5   Title Patient will increase six minute walk test distance to >1000 for progression to community ambulator and improve gait ability    Baseline 01/10/20  620 ft with rollator    Status New    Target Date 02/06/21                 Plan - 01/09/21 1456    Clinical Impression Statement The patient was able to ambulate 620 feet with rolling walker during 6 min walk test.  The patient demonstrated decreased step length on left and occasional dragging of left LE likely due to the significance of arthritis left knee.  Added 6 mins walk test to LTG.  The patient continues to beneift from additional skilled PT services to improve gait, balance and LE strength for improved quality of life.    Personal Factors and Comorbidities Age;Comorbidity 3+    Comorbidities HTN, CVA, cholecystitis with cholecystostomy drain    Examination-Activity Limitations Bend;Lift;Squat;Locomotion Level;Stairs;Stand    Examination-Participation Restrictions Community Activity;Driving;Laundry;Meal Prep;Shop     Stability/Clinical Decision Making Unstable/Unpredictable    Rehab Potential Fair    PT Frequency 2x / week    PT Duration 4 weeks    PT Treatment/Interventions ADLs/Self Care Home Management;Canalith Repostioning;Cryotherapy;Electrical Stimulation;Iontophoresis 96m/ml Dexamethasone;Moist Heat;Traction;Ultrasound;DME Instruction;Gait training;Stair training;Therapeutic activities;Therapeutic exercise;Functional mobility training;Balance training;Neuromuscular re-education;Patient/family education;Manual techniques;Dry needling;Vestibular;Joint Manipulations;Spinal Manipulations;Aquatic Therapy    PT Next Visit Plan Continue to focus on LE strengthening and balance    PT Home Exercise Plan advance as tolerated;    Consulted and Agree with Plan of Care Patient    Family Member Consulted granddaughter           Patient will benefit from skilled therapeutic intervention in order to improve the following deficits and impairments:  Abnormal gait,Decreased balance,Decreased mobility,Difficulty walking,Decreased knowledge of precautions,Decreased safety awareness,Decreased activity tolerance,Decreased strength,Decreased endurance  Visit Diagnosis: Muscle weakness (generalized)  Unsteadiness on feet  Decreased strength of lower extremity  Other lack of coordination  Other abnormalities of gait and mobility  Right middle cerebral artery stroke ([facility]     Problem List Patient Active Problem List   Diagnosis Date Noted  . Aneurysm, aorta, thoracic ([place] 12/21/2020  . Calculus of bile duct without cholecystitis and without obstruction   . Non-small cell carcinoma of lung, right ([place] 09/07/2020  . Mass of upper lobe of right lung 07/13/2020  . Calculus of bile duct with cholecystitis without obstruction   . Abnormal findings on imaging of biliary tract   . Acute cholecystitis 05/02/2020  . Chronic venous insufficiency 12/05/2019  . 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 CKD  (chronic kidney disease), stage II   . New onset  atrial fibrillation ([place])   . Right middle cerebral artery stroke ([place]) 05/27/2019  . Goals of care, counseling/discussion   . Palliative care by specialist   . DNR (do not resuscitate) discussion   . History of CVA (cerebrovascular accident) 05/22/2019  . Persistent proteinuria 07/30/2018  . 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 Chronic kidney disease, stage III (moderate) ([place]) 06/15/2018  . Leg pain 05/06/2018  . Lymphedema 05/06/2018  . Coagulopathy ([place]) 07/07/2017  . Lumbar spondylosis 03/10/2017  . Elevated uric acid in blood 11/20/2016  . BPH (benign prostatic hyperplasia) 05/29/2016  . Peripheral vascular disease of lower extremity ([place]) 03/03/2016  . Gallstone 12/31/2015  . 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 Atherosclerosis of aorta ([place]) 11/22/2015  . Carotid artery narrowing 11/22/2015  . Diverticulosis of colon 11/22/2015  . Decreased creatinine clearance 11/22/2015  . Arthritis, degenerative 11/22/2015  . Lactose intolerance 11/22/2015  . Basal cell carcinoma 11/22/2015  . Essential hypertension 06/21/2015  . Hyperlipemia 06/21/2015  . GERD (gastroesophageal reflux disease) 06/21/2015  . Calculus of kidney 11/18/2013    Hal Morales PT, DPT 01/09/2021, 2:59 PM  [place] MAIN [facility] SERVICES [address] [place], [place], [address] Phone: [phone]   Fax:  [phone]  Name: Jose Castro MRN: [id number] Date of Birth: 01-Sep-1926

## 2021-01-14 ENCOUNTER — Other Ambulatory Visit: Payer: Self-pay

## 2021-01-14 ENCOUNTER — Ambulatory Visit: Payer: Medicare Other

## 2021-01-14 DIAGNOSIS — R278 Other lack of coordination: Secondary | ICD-10-CM

## 2021-01-14 DIAGNOSIS — R29898 Other symptoms and signs involving the musculoskeletal system: Secondary | ICD-10-CM

## 2021-01-14 DIAGNOSIS — R2681 Unsteadiness on feet: Secondary | ICD-10-CM

## 2021-01-14 DIAGNOSIS — M6281 Muscle weakness (generalized): Secondary | ICD-10-CM | POA: Diagnosis not present

## 2021-01-14 DIAGNOSIS — R2689 Other abnormalities of gait and mobility: Secondary | ICD-10-CM

## 2021-01-14 DIAGNOSIS — I63511 Cerebral infarction due to unspecified occlusion or stenosis of right middle cerebral artery: Secondary | ICD-10-CM

## 2021-01-14 NOTE — Therapy (Signed)
[place] MAIN [facility] SERVICES [address] [place], [place], [address] Phone: [phone]   Fax:  [phone]  Physical Therapy Treatment  Patient Details  Name: Jose Castro MRN: [id number] Date of Birth: 11/01/1926 Referring Provider (PT): Dr. Ancil Boozer   Encounter Date: 01/14/2021   PT End of Session - 01/14/21 1359    Visit Number 23    Number of Visits 25    Date for PT Re-Evaluation 01/10/21    Authorization Type eval: 09/20/20, Progress note 01/02/2021    PT Start Time 0146    PT Stop Time 0227    PT Time Calculation (min) 41 min    Equipment Utilized During Treatment Gait belt    Activity Tolerance Patient tolerated treatment well;Patient limited by fatigue;Patient limited by pain    Behavior During Therapy [facility] for tasks assessed/performed           Past Medical History:  Diagnosis Date  . Allergy    Penicillin  . Arthritis   . Basal cell carcinoma   . BPH (benign prostatic hyperplasia)   . Chronic kidney disease    had a kidney stone which per family was a cyst that was removed  . Dysrhythmia   . GERD (gastroesophageal reflux disease)   . History of kidney stones   . Hyperlipidemia   . Hypertension   . Left-sided low back pain with left-sided sciatica   . Lung mass   . Splenic vein thrombosis   . Stroke ([place]) 05/22/2019  . Thrombosis 12/2015   mural  area and no notation of heart attack    Past Surgical History:  Procedure Laterality Date  . APPENDECTOMY    . CATARACT EXTRACTION, BILATERAL    . CYSTOSCOPY WITH INSERTION OF UROLIFT    . ENDOSCOPIC RETROGRADE CHOLANGIOPANCREATOGRAPHY (ERCP) WITH PROPOFOL N/A 05/11/2020   Procedure: ENDOSCOPIC RETROGRADE CHOLANGIOPANCREATOGRAPHY (ERCP) WITH PROPOFOL;  Surgeon: [name], MD;  Location: [facility] ENDOSCOPY;  Service: Endoscopy;  Laterality: N/A;  . ERCP N/A 10/23/2020   Procedure: ENDOSCOPIC RETROGRADE CHOLANGIOPANCREATOGRAPHY (ERCP);  Surgeon: [name], MD;  Location:  [facility] ENDOSCOPY;  Service: Endoscopy;  Laterality: N/A;  . EYE SURGERY  08/2018  . IR EXCHANGE BILIARY DRAIN  05/04/2020  . KIDNEY STONE SURGERY    . KNEE SURGERY    . PACEMAKER INSERTION N/A 07/13/2018   Procedure: INSERTION PACEMAKER-DUEL CHAMBER INITIAL IMPLANT;  Surgeon: Isaias Cowman, MD;  Location: [facility] ORS;  Service: Cardiovascular;  Laterality: N/A;  . SPINE SURGERY      There were no vitals filed for this visit.   Subjective Assessment - 01/14/21 1357    Subjective The patient reports he is doing well.    Patient is accompained by: Family member    Pertinent History Pt referred for deconditioning and gait instability. He has had a decline since he last finished therapy. Pt had a hospital admission from 05/02/20 to 05/14/20 for acute cholecystitis and is now s/p cholecystostomy drain placement. Drain is still in place and pt is waiting to hear when they plan to remove it. Pt discharged to SNF and then returned home. In addition pt underwent radiation therapy for a RUL lung mass/cancer and had his last radiation treatment in September of 2021. He reports that he has continued to walk at home as his primary exercise. Pt was previously seen at this clinic for PT/OT due to weakness/imbalance s/p CVA. Prior history from 09/21/19: Pt is a 86 year old male who presents with imbalance and difficulty with  gait following a right frontal CVA with left hemiparesis on 05/22/19.  He has completed inpatient and home health PT, and now presents for OP PT.  He was discharged from inpatient rehab at a supervision level of assistance, ambulating with a RW.  His family notes that he is impulsive and will try to ambulate without his RW at home.  Family is currently providing 24/7 supervision.    Limitations House hold activities;Walking    How long can you sit comfortably? no limitations    How long can you walk comfortably? he requires a RW    Patient Stated Goals Pt wants to be able to walk his granddaughter  down the aisle at her wedding November 2022    Currently in Pain? No/denies    Pain Score 0-No pain    Pain Onset More than a month ago           Nu-Step x L1 x 5 mins; arms 10, seat 9    Therex:    Standing heel raises x 20 reps Hurdle step overs forward/backward x10 unilateral UE support  Hurdle sidestepping x 10 reps each leg single UE support   Sit to stands without bilateral UE support 2x5 minA   Neuro Re-ed: Airex WBOS EO/EC 30"x1 Airex NBOS EO/EC 30"x1                         PT Education - 01/14/21 1404    Education Details cueing for exercise technique    Person(s) Educated Patient    Methods Explanation;Demonstration    Comprehension Returned demonstration;Verbalized understanding            PT Short Term Goals - 01/03/21 1138      PT SHORT TERM GOAL #1   Title Pt will be independent with HEP in order to improve strength and balance in order to decrease fall risk and improve function at home.    Baseline 01/02/2021- Patient verbalize understanding of walking and seated exercises for home program.    Time 6    Period Weeks    Status Achieved    Target Date 01/10/21             PT Long Term Goals - 01/09/21 1422      PT LONG TERM GOAL #1   Title Pt will improve BERG by at least 3 points in order to demonstrate clinically significant improvement in balance.    Baseline 09/20/20: 32/56 (Previous discharge: 03/06/20: 46/56), 12/14: 32/56, 1/13: 31/56    Time 4    Period Weeks    Status Not Met      PT LONG TERM GOAL #2   Title Pt will decrease TUG to below 14 seconds/decrease in order to demonstrate decreased fall risk.    Baseline 09/20/20: 19.2s, 12/14: 17.48, 1/13: 16.65 sec. 01/02/2021=15.75 sec using 4WW    Time 4    Period Weeks    Status On-going      PT LONG TERM GOAL #3   Title Pt will decrease 5TSTS by at least 3 seconds in order to demonstrate clinically significant improvement in LE strength.    Baseline 09/20/20:  23.1s with minA+1 for strength and balance (Previous discharge 03/06/20: 16.1s), 12/14: 20 sec, 1/13: 22 sec.   01/02/2021= 16.05 sec with light UE support.    Time 4    Period Weeks    Status On-going      PT LONG TERM GOAL #4   Title  Pt will increase his self-selected 10MWT to >0.74 m/s in order to demonstrate improvement in gait speed and improved community ambulation    Baseline 09/20/20: self-selected: 14.4s = 0.69 m/s with rollator (Previous discharge 03/06/20: self-selected: 13.6s = 0.74 m/s), 12/14: 0.75 m/s, 1/13: 0.78 m/s. 01/02/2021= 0.85 m/s using 4WW    Time 4    Period Weeks    Status On-going      PT LONG TERM GOAL #5   Title Patient will increase six minute walk test distance to >1000 for progression to community ambulator and improve gait ability    Baseline 01/10/20  620 ft with rollator    Status New    Target Date 02/06/21                 Plan - 01/14/21 1400    Clinical Impression Statement The patient challenged with hurdle step overs requiring UE support and difficulty placing right LE going forward/backward.  Patient continues ot ambulate with rollator with mod forward trunk lean.  Patient continues to benefit from additional skilled PT services to improve LE strength and balance for improved functional abilities.    Personal Factors and Comorbidities Age;Comorbidity 3+    Comorbidities HTN, CVA, cholecystitis with cholecystostomy drain    Examination-Activity Limitations Bend;Lift;Squat;Locomotion Level;Stairs;Stand    Examination-Participation Restrictions Community Activity;Driving;Laundry;Meal Prep;Shop    Stability/Clinical Decision Making Unstable/Unpredictable    Rehab Potential Fair    PT Frequency 2x / week    PT Duration 4 weeks    PT Treatment/Interventions ADLs/Self Care Home Management;Canalith Repostioning;Cryotherapy;Electrical Stimulation;Iontophoresis 13m/ml Dexamethasone;Moist Heat;Traction;Ultrasound;DME Instruction;Gait training;Stair  training;Therapeutic activities;Therapeutic exercise;Functional mobility training;Balance training;Neuromuscular re-education;Patient/family education;Manual techniques;Dry needling;Vestibular;Joint Manipulations;Spinal Manipulations;Aquatic Therapy    PT Next Visit Plan Continue to focus on LE strengthening and balance    PT Home Exercise Plan advance as tolerated;    Consulted and Agree with Plan of Care Patient    Family Member Consulted granddaughter           Patient will benefit from skilled therapeutic intervention in order to improve the following deficits and impairments:  Abnormal gait,Decreased balance,Decreased mobility,Difficulty walking,Decreased knowledge of precautions,Decreased safety awareness,Decreased activity tolerance,Decreased strength,Decreased endurance  Visit Diagnosis: Muscle weakness (generalized)  Unsteadiness on feet  Decreased strength of lower extremity  Other lack of coordination  Other abnormalities of gait and mobility  Right middle cerebral artery stroke ([facility]     Problem List Patient Active Problem List   Diagnosis Date Noted  . Aneurysm, aorta, thoracic ([place] 12/21/2020  . Calculus of bile duct without cholecystitis and without obstruction   . Non-small cell carcinoma of lung, right ([place] 09/07/2020  . Mass of upper lobe of right lung 07/13/2020  . Calculus of bile duct with cholecystitis without obstruction   . Abnormal findings on imaging of biliary tract   . Acute cholecystitis 05/02/2020  . Chronic venous insufficiency 12/05/2019  . 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 CKD (chronic kidney disease), stage II   . New onset atrial fibrillation ([place]   . Right middle cerebral artery stroke ([place] 05/27/2019  . Goals of care, counseling/discussion   . Palliative care by specialist   . DNR (do not resuscitate) discussion   . History of CVA (cerebrovascular accident) 05/22/2019  . Persistent proteinuria 07/30/2018  . Mobitz  type 2 second degree heart block 07/13/2018  . Chronic kidney disease, stage III (moderate) ([place] 06/15/2018  . Leg pain 05/06/2018  . Lymphedema 05/06/2018  . Coagulopathy ([place] 07/07/2017  .  Lumbar spondylosis 03/10/2017  . Elevated uric acid in blood 11/20/2016  . BPH (benign prostatic hyperplasia) 05/29/2016  . Peripheral vascular disease of lower extremity ([place]) 03/03/2016  . Gallstone 12/31/2015  . 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 Atherosclerosis of aorta ([place]) 11/22/2015  . Carotid artery narrowing 11/22/2015  . Diverticulosis of colon 11/22/2015  . Decreased creatinine clearance 11/22/2015  . Arthritis, degenerative 11/22/2015  . Lactose intolerance 11/22/2015  . Basal cell carcinoma 11/22/2015  . Essential hypertension 06/21/2015  . Hyperlipemia 06/21/2015  . GERD (gastroesophageal reflux disease) 06/21/2015  . Calculus of kidney 11/18/2013    Hal Morales PT, DPT 01/14/2021, 2:29 PM  [place] MAIN [facility] SERVICES [address] [place], [place], [address] Phone: [phone]   Fax:  [phone]  Name: Jose Castro MRN: [id number] Date of Birth: 1926-06-06

## 2021-01-16 ENCOUNTER — Ambulatory Visit: Payer: Medicare Other

## 2021-01-16 ENCOUNTER — Other Ambulatory Visit: Payer: Self-pay

## 2021-01-16 DIAGNOSIS — R29898 Other symptoms and signs involving the musculoskeletal system: Secondary | ICD-10-CM

## 2021-01-16 DIAGNOSIS — M6281 Muscle weakness (generalized): Secondary | ICD-10-CM | POA: Diagnosis not present

## 2021-01-16 DIAGNOSIS — R278 Other lack of coordination: Secondary | ICD-10-CM

## 2021-01-16 DIAGNOSIS — R2681 Unsteadiness on feet: Secondary | ICD-10-CM

## 2021-01-16 DIAGNOSIS — R2689 Other abnormalities of gait and mobility: Secondary | ICD-10-CM

## 2021-01-16 DIAGNOSIS — I63511 Cerebral infarction due to unspecified occlusion or stenosis of right middle cerebral artery: Secondary | ICD-10-CM

## 2021-01-16 NOTE — Therapy (Signed)
[place] MAIN [facility] SERVICES [address] [place], [place], [address] Phone: [phone]   Fax:  [phone]  Physical Therapy Treatment  Patient Details  Name: Jose Castro MRN: [id number] Date of Birth: 10-Sep-1926 Referring Provider (PT): Dr. Ancil Boozer   Encounter Date: 01/16/2021   PT End of Session - 01/16/21 1405    Visit Number 24    Number of Visits 25    Date for PT Re-Evaluation 01/10/21    Authorization Type eval: 09/20/20, Progress note 01/02/2021    PT Start Time 0200    PT Stop Time 0241    PT Time Calculation (min) 41 min    Equipment Utilized During Treatment Gait belt    Activity Tolerance Patient tolerated treatment well;Patient limited by fatigue;Patient limited by pain    Behavior During Therapy [facility] for tasks assessed/performed           Past Medical History:  Diagnosis Date  . Allergy    Penicillin  . Arthritis   . Basal cell carcinoma   . BPH (benign prostatic hyperplasia)   . Chronic kidney disease    had a kidney stone which per family was a cyst that was removed  . Dysrhythmia   . GERD (gastroesophageal reflux disease)   . History of kidney stones   . Hyperlipidemia   . Hypertension   . Left-sided low back pain with left-sided sciatica   . Lung mass   . Splenic vein thrombosis   . Stroke ([place]) 05/22/2019  . Thrombosis 12/2015   mural  area and no notation of heart attack    Past Surgical History:  Procedure Laterality Date  . APPENDECTOMY    . CATARACT EXTRACTION, BILATERAL    . CYSTOSCOPY WITH INSERTION OF UROLIFT    . ENDOSCOPIC RETROGRADE CHOLANGIOPANCREATOGRAPHY (ERCP) WITH PROPOFOL N/A 05/11/2020   Procedure: ENDOSCOPIC RETROGRADE CHOLANGIOPANCREATOGRAPHY (ERCP) WITH PROPOFOL;  Surgeon: [name], MD;  Location: [facility] ENDOSCOPY;  Service: Endoscopy;  Laterality: N/A;  . ERCP N/A 10/23/2020   Procedure: ENDOSCOPIC RETROGRADE CHOLANGIOPANCREATOGRAPHY (ERCP);  Surgeon: [name], MD;  Location:  [facility] ENDOSCOPY;  Service: Endoscopy;  Laterality: N/A;  . EYE SURGERY  08/2018  . IR EXCHANGE BILIARY DRAIN  05/04/2020  . KIDNEY STONE SURGERY    . KNEE SURGERY    . PACEMAKER INSERTION N/A 07/13/2018   Procedure: INSERTION PACEMAKER-DUEL CHAMBER INITIAL IMPLANT;  Surgeon: Isaias Cowman, MD;  Location: [facility] ORS;  Service: Cardiovascular;  Laterality: N/A;  . SPINE SURGERY      There were no vitals filed for this visit.   Subjective Assessment - 01/16/21 1404    Subjective Patient 15 mins late for PT today.  The patient reports he is continuing to do well without complaints.    Patient is accompained by: Family member    Pertinent History Pt referred for deconditioning and gait instability. He has had a decline since he last finished therapy. Pt had a hospital admission from 05/02/20 to 05/14/20 for acute cholecystitis and is now s/p cholecystostomy drain placement. Drain is still in place and pt is waiting to hear when they plan to remove it. Pt discharged to SNF and then returned home. In addition pt underwent radiation therapy for a RUL lung mass/cancer and had his last radiation treatment in September of 2021. He reports that he has continued to walk at home as his primary exercise. Pt was previously seen at this clinic for PT/OT due to weakness/imbalance s/p CVA. Prior history from 09/21/19:  Pt is a 87 year old male who presents with imbalance and difficulty with gait following a right frontal CVA with left hemiparesis on 05/22/19.  He has completed inpatient and home health PT, and now presents for OP PT.  He was discharged from inpatient rehab at a supervision level of assistance, ambulating with a RW.  His family notes that he is impulsive and will try to ambulate without his RW at home.  Family is currently providing 24/7 supervision.    Limitations House hold activities;Walking    How long can you sit comfortably? no limitations    How long can you walk comfortably? he requires a RW     Patient Stated Goals Pt wants to be able to walk his granddaughter down the aisle at her wedding November 2022    Currently in Pain? No/denies    Pain Score 0-No pain    Pain Onset More than a month ago           Nu-Step x L1 x 5 mins; arms 10, seat 9   Updated 68mwalk test and BERG  Therex:   Standing heel raises x 20 reps    Neuro Re-ed: Airex WBOS EO/EC 30"x1 no UE support heavy posterior lean with EC Airex NBOS EO/EC 30"x1 no UE support heavy posterior lean with EC Airex step ups x5 each, intermittent UE support   [facility] - 01/16/21 0001      Standardized Balance Assessment   Standardized Balance Assessment Berg Balance Test      Berg Balance Test   Sit to Stand Able to stand using hands after several tries    Standing Unsupported Able to stand safely 2 minutes    Sitting with Back Unsupported but Feet Supported on Floor or Stool Able to sit safely and securely 2 minutes    Stand to Sit Sits safely with minimal use of hands    Transfers Able to transfer safely, definite need of hands    Standing Unsupported with Eyes Closed Able to stand 10 seconds with supervision    Standing Unsupported with Feet Together Able to place feet together independently and stand for 1 minute with supervision    From Standing, Reach Forward with Outstretched Arm Can reach forward >5 cm safely (2")    From Standing Position, Pick up Object from [place] pick [place] shoe, needs supervision    From Standing Position, Turn to Look Behind Over each Shoulder Looks behind one side only/other side shows less weight shift    Turn 360 Degrees Able to turn 360 degrees safely but slowly    Standing Unsupported, Alternately Place Feet on Step/Stool Able to complete >2 steps/needs minimal assist    Standing Unsupported, One Foot in Front Needs help to step but can hold 15 seconds    Standing on One Leg Tries to lift leg/unable to hold 3 seconds but remains standing independently    Total Score  36                                 PT Education - 01/16/21 1434    Education Details progress on Berg and 174malk test    Person(s) Educated Patient    Methods Explanation    Comprehension Verbalized understanding            PT Short Term Goals - 01/03/21 1138      PT SHORT TERM GOAL #1  Title Pt will be independent with HEP in order to improve strength and balance in order to decrease fall risk and improve function at home.    Baseline 01/02/2021- Patient verbalize understanding of walking and seated exercises for home program.    Time 6    Period Weeks    Status Achieved    Target Date 01/10/21             PT Long Term Goals - 01/16/21 1419      PT LONG TERM GOAL #1   Title Pt will improve BERG by at least 3 points in order to demonstrate clinically significant improvement in balance.    Baseline 09/20/20: 32/56 (Previous discharge: 03/06/20: 46/56), 12/14: 32/56, 1/13: 31/56, 01/16/21 35/56    Time 4    Period Weeks    Status Partially Met      PT LONG TERM GOAL #2   Title Pt will decrease TUG to below 14 seconds/decrease in order to demonstrate decreased fall risk.    Baseline 09/20/20: 19.2s, 12/14: 17.48, 1/13: 16.65 sec. 01/02/2021=15.75 sec using 4WW    Time 4    Period Weeks    Status On-going      PT LONG TERM GOAL #3   Title Pt will decrease 5TSTS by at least 3 seconds in order to demonstrate clinically significant improvement in LE strength.    Baseline 09/20/20: 23.1s with minA+1 for strength and balance (Previous discharge 03/06/20: 16.1s), 12/14: 20 sec, 1/13: 22 sec.   01/02/2021= 16.05 sec with light UE support. 01/16/21    Time 4    Period Weeks    Status On-going    Target Date 02/13/21      PT LONG TERM GOAL #4   Title Pt will increase his self-selected 10MWT to >0.74 m/s in order to demonstrate improvement in gait speed and improved community ambulation    Baseline 09/20/20: self-selected: 14.4s = 0.69 m/s with rollator  (Previous discharge 03/06/20: self-selected: 13.6s = 0.74 m/s), 12/14: 0.75 m/s, 1/13: 0.78 m/s. 01/02/2021= 0.85 m/s using 4WW, 01/16/21 with 4 WW .99 m/s    Time 4    Period Weeks    Status Partially Met    Target Date 02/13/21      PT LONG TERM GOAL #5   Title Patient will increase six minute walk test distance to >1000 for progression to community ambulator and improve gait ability    Baseline 01/10/20  620 ft with rollator    Status New                 Plan - 01/16/21 1451    Clinical Impression Statement Patient making gains overall with improved Berg score and improved speed with 63mwalk test.  The patient continues to be a fall risk and presents with decreased stability to static standing on uneven surface.  Patient continues to require rollator for safe ambulation.  The patient continues to benefit from additional skilled PT services to improve strength, balance and gait for decreased fall risk and improved quality of life.    Personal Factors and Comorbidities Age;Comorbidity 3+    Comorbidities HTN, CVA, cholecystitis with cholecystostomy drain    Examination-Activity Limitations Bend;Lift;Squat;Locomotion Level;Stairs;Stand    Examination-Participation Restrictions Community Activity;Driving;Laundry;Meal Prep;Shop    Stability/Clinical Decision Making Unstable/Unpredictable    Rehab Potential Fair    PT Frequency 2x / week    PT Duration 4 weeks    PT Treatment/Interventions ADLs/Self Care Home Management;Canalith Repostioning;Cryotherapy;Electrical Stimulation;Iontophoresis 479mml Dexamethasone;Moist Heat;Traction;Ultrasound;DME  Instruction;Gait training;Stair training;Therapeutic activities;Therapeutic exercise;Functional mobility training;Balance training;Neuromuscular re-education;Patient/family education;Manual techniques;Dry needling;Vestibular;Joint Manipulations;Spinal Manipulations;Aquatic Therapy    PT Next Visit Plan Continue to focus on LE strengthening and balance     PT Home Exercise Plan advance as tolerated;    Consulted and Agree with Plan of Care Patient    Family Member Consulted granddaughter           Patient will benefit from skilled therapeutic intervention in order to improve the following deficits and impairments:  Abnormal gait,Decreased balance,Decreased mobility,Difficulty walking,Decreased knowledge of precautions,Decreased safety awareness,Decreased activity tolerance,Decreased strength,Decreased endurance  Visit Diagnosis: Muscle weakness (generalized)  Unsteadiness on feet  Decreased strength of lower extremity  Other lack of coordination  Other abnormalities of gait and mobility  Right middle cerebral artery stroke [facility])     Problem List Patient Active Problem List   Diagnosis Date Noted  . Aneurysm, aorta, thoracic ([place]) 12/21/2020  . Calculus of bile duct without cholecystitis and without obstruction   . Non-small cell carcinoma of lung, right ([place]) 09/07/2020  . Mass of upper lobe of right lung 07/13/2020  . Calculus of bile duct with cholecystitis without obstruction   . Abnormal findings on imaging of biliary tract   . Acute cholecystitis 05/02/2020  . Chronic venous insufficiency 12/05/2019  . 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 CKD (chronic kidney disease), stage II   . New onset atrial fibrillation ([place])   . Right middle cerebral artery stroke ([place]) 05/27/2019  . Goals of care, counseling/discussion   . Palliative care by specialist   . DNR (do not resuscitate) discussion   . History of CVA (cerebrovascular accident) 05/22/2019  . Persistent proteinuria 07/30/2018  . 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 Chronic kidney disease, stage III (moderate) ([place]) 06/15/2018  . Leg pain 05/06/2018  . Lymphedema 05/06/2018  . Coagulopathy ([place]) 07/07/2017  . Lumbar spondylosis 03/10/2017  . Elevated uric acid in blood 11/20/2016  . BPH (benign prostatic hyperplasia) 05/29/2016   . Peripheral vascular disease of lower extremity ([place]) 03/03/2016  . Gallstone 12/31/2015  . 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 Atherosclerosis of aorta ([place]) 11/22/2015  . Carotid artery narrowing 11/22/2015  . Diverticulosis of colon 11/22/2015  . Decreased creatinine clearance 11/22/2015  . Arthritis, degenerative 11/22/2015  . Lactose intolerance 11/22/2015  . Basal cell carcinoma 11/22/2015  . Essential hypertension 06/21/2015  . Hyperlipemia 06/21/2015  . GERD (gastroesophageal reflux disease) 06/21/2015  . Calculus of kidney 11/18/2013    Hal Morales PT, DPT 01/16/2021, 2:54 PM  [place] MAIN [facility] SERVICES [address] [place], [place], [address] Phone: [phone]   Fax:  [phone]  Name: Jose Castro MRN: [id number] Date of Birth: [birth year]

## 2021-01-21 ENCOUNTER — Other Ambulatory Visit: Payer: Self-pay

## 2021-01-21 ENCOUNTER — Ambulatory Visit: Payer: Medicare Other

## 2021-01-21 DIAGNOSIS — I63511 Cerebral infarction due to unspecified occlusion or stenosis of right middle cerebral artery: Secondary | ICD-10-CM

## 2021-01-21 DIAGNOSIS — M6281 Muscle weakness (generalized): Secondary | ICD-10-CM | POA: Diagnosis not present

## 2021-01-21 DIAGNOSIS — R2689 Other abnormalities of gait and mobility: Secondary | ICD-10-CM

## 2021-01-21 DIAGNOSIS — R278 Other lack of coordination: Secondary | ICD-10-CM

## 2021-01-21 DIAGNOSIS — R29898 Other symptoms and signs involving the musculoskeletal system: Secondary | ICD-10-CM

## 2021-01-21 DIAGNOSIS — R2681 Unsteadiness on feet: Secondary | ICD-10-CM

## 2021-01-21 NOTE — Therapy (Signed)
[place] MAIN [facility] SERVICES [address] [place], [place], [address] Phone: [phone]   Fax:  [phone]  Physical Therapy Treatment/RE-CERTIFICATION 7/54/49-01/01/99  Patient Details  Name: Jose Castro MRN: [id number] Date of Birth: Jan 21, 1926 Referring Provider (PT): Dr. Ancil Boozer   Encounter Date: 01/21/2021   PT End of Session - 01/21/21 1401    Visit Number 25    Number of Visits 33    Date for PT Re-Evaluation 02/18/21    Authorization Type eval: 09/20/20, Progress note 01/02/2021    PT Start Time 0147    PT Stop Time 0230    PT Time Calculation (min) 43 min    Equipment Utilized During Treatment Gait belt    Activity Tolerance Patient tolerated treatment well;Patient limited by fatigue;Patient limited by pain    Behavior During Therapy [facility] for tasks assessed/performed           Past Medical History:  Diagnosis Date  . Allergy    Penicillin  . Arthritis   . Basal cell carcinoma   . BPH (benign prostatic hyperplasia)   . Chronic kidney disease    had a kidney stone which per family was a cyst that was removed  . Dysrhythmia   . GERD (gastroesophageal reflux disease)   . History of kidney stones   . Hyperlipidemia   . Hypertension   . Left-sided low back pain with left-sided sciatica   . Lung mass   . Splenic vein thrombosis   . Stroke ([place]) 05/22/2019  . Thrombosis 12/2015   mural  area and no notation of heart attack    Past Surgical History:  Procedure Laterality Date  . APPENDECTOMY    . CATARACT EXTRACTION, BILATERAL    . CYSTOSCOPY WITH INSERTION OF UROLIFT    . ENDOSCOPIC RETROGRADE CHOLANGIOPANCREATOGRAPHY (ERCP) WITH PROPOFOL N/A 05/11/2020   Procedure: ENDOSCOPIC RETROGRADE CHOLANGIOPANCREATOGRAPHY (ERCP) WITH PROPOFOL;  Surgeon: [name], MD;  Location: [facility] ENDOSCOPY;  Service: Endoscopy;  Laterality: N/A;  . ERCP N/A 10/23/2020   Procedure: ENDOSCOPIC RETROGRADE CHOLANGIOPANCREATOGRAPHY (ERCP);   Surgeon: [name], MD;  Location: [facility] ENDOSCOPY;  Service: Endoscopy;  Laterality: N/A;  . EYE SURGERY  08/2018  . IR EXCHANGE BILIARY DRAIN  05/04/2020  . KIDNEY STONE SURGERY    . KNEE SURGERY    . PACEMAKER INSERTION N/A 07/13/2018   Procedure: INSERTION PACEMAKER-DUEL CHAMBER INITIAL IMPLANT;  Surgeon: Isaias Cowman, MD;  Location: [facility] ORS;  Service: Cardiovascular;  Laterality: N/A;  . SPINE SURGERY      There were no vitals filed for this visit.   RE-CERTIFICATION performed Goals updated Plan discussed with patient and patient's daughter to continue PT 2x/week for 8 additional visits.                             PT Education - 01/21/21 1436    Education Details goal progress, pt continuation    Person(s) Educated Patient;Child(ren)    Methods Explanation    Comprehension Verbalized understanding            PT Short Term Goals - 01/21/21 1438      PT SHORT TERM GOAL #1   Title Pt will be independent with HEP in order to improve strength and balance in order to decrease fall risk and improve function at home.    Baseline 01/02/2021- Patient verbalize understanding of walking and seated exercises for home program. 01/21/21 patient has been more active  and understands HEP.    Time 6    Period Weeks    Status Achieved    Target Date 01/10/21             PT Long Term Goals - 01/21/21 1413      PT LONG TERM GOAL #1   Title Pt will improve BERG by at least 3 points in order to demonstrate clinically significant improvement in balance.    Baseline 09/20/20: 32/56 (Previous discharge: 03/06/20: 46/56), 12/14: 32/56, 1/13: 31/56, 01/16/21 35/56    Time 4    Period Weeks    Status Partially Met    Target Date 02/18/21      PT LONG TERM GOAL #2   Title Pt will decrease TUG to below 14 seconds/decrease in order to demonstrate decreased fall risk.    Baseline 09/20/20: 19.2s, 12/14: 17.48, 1/13: 16.65 sec. 01/02/2021=15.75 sec using 4WW, 01/21/21   14.37 seconds    Time 4    Period Weeks    Status On-going    Target Date 02/18/21      PT LONG TERM GOAL #3   Title Pt will decrease 5TSTS by at least 3 seconds in order to demonstrate clinically significant improvement in LE strength.    Baseline 09/20/20: 23.1s with minA+1 for strength and balance (Previous discharge 03/06/20: 16.1s), 12/14: 20 sec, 1/13: 22 sec.   01/02/2021= 16.05 sec with light UE support. 01/21/21 01/21/21 37.90 seconds without UE, with UE support 12.93 seconds    Time 4    Period Weeks    Status On-going    Target Date 02/18/21      PT LONG TERM GOAL #4   Title Pt will increase his self-selected 10MWT to >0.74 m/s in order to demonstrate improvement in gait speed and improved community ambulation    Baseline 09/20/20: self-selected: 14.4s = 0.69 m/s with rollator (Previous discharge 03/06/20: self-selected: 13.6s = 0.74 m/s), 12/14: 0.75 m/s, 1/13: 0.78 m/s. 01/02/2021= 0.85 m/s using 4WW, 01/16/21 with 4 WW .99 m/s    Time 4    Period Weeks    Status Partially Met    Target Date 02/18/21      PT LONG TERM GOAL #5   Title Patient will increase six minute walk test distance to >1000 for progression to community ambulator and improve gait ability    Baseline 01/10/20  620 ft with rollator, 01/21/21  965 ft    Status Partially Met    Target Date 02/18/21                 Plan - 01/21/21 1433    Clinical Impression Statement The patient overall has made good progress with improved gait speed and ability to ambulate longer distances safely.  The patient's balance continues to be decreased and patient presents with difficulty making turns. see updated goals.  Patient's condition has the potential to improve in response to therapy. Maximum improvement is yet to be obtained. The anticipated improvement is attainable and reasonable in a generally predictable time.    Personal Factors and Comorbidities Age;Comorbidity 3+    Comorbidities HTN, CVA, cholecystitis with  cholecystostomy drain    Examination-Activity Limitations Bend;Lift;Squat;Locomotion Level;Stairs;Stand    Examination-Participation Restrictions Community Activity;Driving;Laundry;Meal Prep;Shop    Stability/Clinical Decision Making Unstable/Unpredictable    Rehab Potential Fair    PT Frequency 2x / week    PT Duration 4 weeks    PT Treatment/Interventions ADLs/Self Care Home Management;Canalith Repostioning;Cryotherapy;Electrical Stimulation;Iontophoresis 4mg /ml Dexamethasone;Moist Heat;Traction;Ultrasound;DME Instruction;Gait training;Stair training;Therapeutic  activities;Therapeutic exercise;Functional mobility training;Balance training;Neuromuscular re-education;Patient/family education;Manual techniques;Dry needling;Vestibular;Joint Manipulations;Spinal Manipulations;Aquatic Therapy    PT Next Visit Plan Continue to focus on LE strengthening and balance    PT Home Exercise Plan advance as tolerated;    Consulted and Agree with Plan of Care Patient    Family Member Consulted granddaughter           Patient will benefit from skilled therapeutic intervention in order to improve the following deficits and impairments:  Abnormal gait,Decreased balance,Decreased mobility,Difficulty walking,Decreased knowledge of precautions,Decreased safety awareness,Decreased activity tolerance,Decreased strength,Decreased endurance  Visit Diagnosis: Muscle weakness (generalized)  Unsteadiness on feet  Decreased strength of lower extremity  Other lack of coordination  Other abnormalities of gait and mobility  Right middle cerebral artery stroke [facility])     Problem List Patient Active Problem List   Diagnosis Date Noted  . Aneurysm, aorta, thoracic ([place]) 12/21/2020  . Calculus of bile duct without cholecystitis and without obstruction   . Non-small cell carcinoma of lung, right ([place]) 09/07/2020  . Mass of upper lobe of right lung 07/13/2020  . Calculus of bile duct with cholecystitis without  obstruction   . Abnormal findings on imaging of biliary tract   . Acute cholecystitis 05/02/2020  . Chronic venous insufficiency 12/05/2019  . 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 CKD (chronic kidney disease), stage II   . New onset atrial fibrillation ([place])   . 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 Goals of care, counseling/discussion   . Palliative care by specialist   . DNR (do not resuscitate) discussion   . History of CVA (cerebrovascular accident) 05/22/2019  . Persistent proteinuria 07/30/2018  . 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 Chronic kidney disease, stage III (moderate) ([place]) 06/15/2018  . Leg pain 05/06/2018  . Lymphedema 05/06/2018  . Coagulopathy ([place]) 07/07/2017  . Lumbar spondylosis 03/10/2017  . Elevated uric acid in blood 11/20/2016  . BPH (benign prostatic hyperplasia) 05/29/2016  . Peripheral vascular disease of lower extremity ([place]) 03/03/2016  . Gallstone 12/31/2015  . 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 Atherosclerosis of aorta ([place]) 11/22/2015  . Carotid artery narrowing 11/22/2015  . Diverticulosis of colon 11/22/2015  . Decreased creatinine clearance 11/22/2015  . Arthritis, degenerative 11/22/2015  . Lactose intolerance 11/22/2015  . Basal cell carcinoma 11/22/2015  . Essential hypertension 06/21/2015  . Hyperlipemia 06/21/2015  . GERD (gastroesophageal reflux disease) 06/21/2015  . Calculus of kidney 11/18/2013    Hal Morales PT, DPT 01/21/2021, 2:40 PM  [place] MAIN [facility] SERVICES [address] [place], [place], [address] Phone: [phone]   Fax:  [phone]  Name: Jose Castro MRN: [id number] Date of Birth: [birth year]

## 2021-01-23 ENCOUNTER — Ambulatory Visit: Payer: Medicare Other

## 2021-01-23 ENCOUNTER — Other Ambulatory Visit: Payer: Self-pay

## 2021-01-23 DIAGNOSIS — I63511 Cerebral infarction due to unspecified occlusion or stenosis of right middle cerebral artery: Secondary | ICD-10-CM

## 2021-01-23 DIAGNOSIS — M6281 Muscle weakness (generalized): Secondary | ICD-10-CM

## 2021-01-23 DIAGNOSIS — R2681 Unsteadiness on feet: Secondary | ICD-10-CM

## 2021-01-23 DIAGNOSIS — R2689 Other abnormalities of gait and mobility: Secondary | ICD-10-CM

## 2021-01-23 DIAGNOSIS — R29898 Other symptoms and signs involving the musculoskeletal system: Secondary | ICD-10-CM

## 2021-01-23 DIAGNOSIS — R278 Other lack of coordination: Secondary | ICD-10-CM

## 2021-01-23 NOTE — Therapy (Signed)
[place] MAIN [facility] SERVICES [address] [place], [place], [address] Phone: [phone]   Fax:  [phone]  Physical Therapy Treatment  Patient Details  Name: Jose Castro MRN: [id number] Date of Birth: 12-13-1925 Referring Provider (PT): Dr. Ancil Boozer   Encounter Date: 01/23/2021   PT End of Session - 01/23/21 1341    Visit Number 26    Number of Visits 33    Date for PT Re-Evaluation 02/18/21    Authorization Type eval: 09/20/20, Progress note 01/02/2021    PT Start Time 0145    PT Stop Time 0229    PT Time Calculation (min) 44 min    Equipment Utilized During Treatment Gait belt    Activity Tolerance Patient tolerated treatment well;Patient limited by fatigue;Patient limited by pain    Behavior During Therapy [facility] for tasks assessed/performed           Past Medical History:  Diagnosis Date  . Allergy    Penicillin  . Arthritis   . Basal cell carcinoma   . BPH (benign prostatic hyperplasia)   . Chronic kidney disease    had a kidney stone which per family was a cyst that was removed  . Dysrhythmia   . GERD (gastroesophageal reflux disease)   . History of kidney stones   . Hyperlipidemia   . Hypertension   . Left-sided low back pain with left-sided sciatica   . Lung mass   . Splenic vein thrombosis   . Stroke ([place]) 05/22/2019  . Thrombosis 12/2015   mural  area and no notation of heart attack    Past Surgical History:  Procedure Laterality Date  . APPENDECTOMY    . CATARACT EXTRACTION, BILATERAL    . CYSTOSCOPY WITH INSERTION OF UROLIFT    . ENDOSCOPIC RETROGRADE CHOLANGIOPANCREATOGRAPHY (ERCP) WITH PROPOFOL N/A 05/11/2020   Procedure: ENDOSCOPIC RETROGRADE CHOLANGIOPANCREATOGRAPHY (ERCP) WITH PROPOFOL;  Surgeon: [name], MD;  Location: [facility] ENDOSCOPY;  Service: Endoscopy;  Laterality: N/A;  . ERCP N/A 10/23/2020   Procedure: ENDOSCOPIC RETROGRADE CHOLANGIOPANCREATOGRAPHY (ERCP);  Surgeon: [name], MD;  Location:  [facility] ENDOSCOPY;  Service: Endoscopy;  Laterality: N/A;  . EYE SURGERY  08/2018  . IR EXCHANGE BILIARY DRAIN  05/04/2020  . KIDNEY STONE SURGERY    . KNEE SURGERY    . PACEMAKER INSERTION N/A 07/13/2018   Procedure: INSERTION PACEMAKER-DUEL CHAMBER INITIAL IMPLANT;  Surgeon: Isaias Cowman, MD;  Location: [facility] ORS;  Service: Cardiovascular;  Laterality: N/A;  . SPINE SURGERY      There were no vitals filed for this visit.   Subjective Assessment - 01/23/21 1351    Subjective Patient reports he is doing well, no LOB or stumbles.    Patient is accompained by: Family member    Pertinent History Pt referred for deconditioning and gait instability. He has had a decline since he last finished therapy. Pt had a hospital admission from 05/02/20 to 05/14/20 for acute cholecystitis and is now s/p cholecystostomy drain placement. Drain is still in place and pt is waiting to hear when they plan to remove it. Pt discharged to SNF and then returned home. In addition pt underwent radiation therapy for a RUL lung mass/cancer and had his last radiation treatment in September of 2021. He reports that he has continued to walk at home as his primary exercise. Pt was previously seen at this clinic for PT/OT due to weakness/imbalance s/p CVA. Prior history from 09/21/19: Pt is a 87 year old male who presents with imbalance  and difficulty with gait following a right frontal CVA with left hemiparesis on 05/22/19.  He has completed inpatient and home health PT, and now presents for OP PT.  He was discharged from inpatient rehab at a supervision level of assistance, ambulating with a RW.  His family notes that he is impulsive and will try to ambulate without his RW at home.  Family is currently providing 24/7 supervision.    Limitations House hold activities;Walking    How long can you sit comfortably? no limitations    How long can you walk comfortably? he requires a RW    Patient Stated Goals Pt wants to be able to walk his  granddaughter down the aisle at her wedding November 2022    Currently in Pain? No/denies    Pain Score 0-No pain    Pain Onset More than a month ago           Nu-Step x L1 x 5 mins; arms 12, seat 9, SPM kept >70  Therex:    Hurdle step overs forward/backward x10 unilateral UE support  Hurdle sidestepping x 10 reps each leg single UE support BOSU forward lunges 5"x5 each heavy VC for technique, no UE support mod CGA   Airex beam side stepping no UE support x3 laps mod CTA  [place] Balance Tux Racer 3 trials                          PT Education - 01/23/21 1353    Education Details balance, exercise    Person(s) Educated Patient    Methods Explanation    Comprehension Verbalized understanding            PT Short Term Goals - 01/21/21 1438      PT SHORT TERM GOAL #1   Title Pt will be independent with HEP in order to improve strength and balance in order to decrease fall risk and improve function at home.    Baseline 01/02/2021- Patient verbalize understanding of walking and seated exercises for home program. 01/21/21 patient has been more active and understands HEP.    Time 6    Period Weeks    Status Achieved    Target Date 01/10/21             PT Long Term Goals - 01/21/21 1413      PT LONG TERM GOAL #1   Title Pt will improve BERG by at least 3 points in order to demonstrate clinically significant improvement in balance.    Baseline 09/20/20: 32/56 (Previous discharge: 03/06/20: 46/56), 12/14: 32/56, 1/13: 31/56, 01/16/21 35/56    Time 4    Period Weeks    Status Partially Met    Target Date 02/18/21      PT LONG TERM GOAL #2   Title Pt will decrease TUG to below 14 seconds/decrease in order to demonstrate decreased fall risk.    Baseline 09/20/20: 19.2s, 12/14: 17.48, 1/13: 16.65 sec. 01/02/2021=15.75 sec using 4WW, 01/21/21  14.37 seconds    Time 4    Period Weeks    Status On-going    Target Date 02/18/21      PT LONG TERM GOAL #3    Title Pt will decrease 5TSTS by at least 3 seconds in order to demonstrate clinically significant improvement in LE strength.    Baseline 09/20/20: 23.1s with minA+1 for strength and balance (Previous discharge 03/06/20: 16.1s), 12/14: 20 sec, 1/13: 22 sec.   01/02/2021=  16.05 sec with light UE support. 01/21/21 01/21/21 37.90 seconds without UE, with UE support 12.93 seconds    Time 4    Period Weeks    Status On-going    Target Date 02/18/21      PT LONG TERM GOAL #4   Title Pt will increase his self-selected 10MWT to >0.74 m/s in order to demonstrate improvement in gait speed and improved community ambulation    Baseline 09/20/20: self-selected: 14.4s = 0.69 m/s with rollator (Previous discharge 03/06/20: self-selected: 13.6s = 0.74 m/s), 12/14: 0.75 m/s, 1/13: 0.78 m/s. 01/02/2021= 0.85 m/s using 4WW, 01/16/21 with 4 WW .99 m/s    Time 4    Period Weeks    Status Partially Met    Target Date 02/18/21      PT LONG TERM GOAL #5   Title Patient will increase six minute walk test distance to >1000 for progression to community ambulator and improve gait ability    Baseline 01/10/20  620 ft with rollator, 01/21/21  965 ft    Status Partially Met    Target Date 02/18/21                 Plan - 01/23/21 1353    Clinical Impression Statement Patient was appropriately challenged with dynamic balance activities.  Patient required cueing and demonstration for correct exercise technique.  The patient continues to benefit from additional skilled PT services to improve LE strength and balance for improved functional capacity.    Personal Factors and Comorbidities Age;Comorbidity 3+    Comorbidities HTN, CVA, cholecystitis with cholecystostomy drain    Examination-Activity Limitations Bend;Lift;Squat;Locomotion Level;Stairs;Stand    Examination-Participation Restrictions Community Activity;Driving;Laundry;Meal Prep;Shop    Stability/Clinical Decision Making Unstable/Unpredictable    Rehab Potential  Fair    PT Frequency 2x / week    PT Duration 4 weeks    PT Treatment/Interventions ADLs/Self Care Home Management;Canalith Repostioning;Cryotherapy;Electrical Stimulation;Iontophoresis 64m/ml Dexamethasone;Moist Heat;Traction;Ultrasound;DME Instruction;Gait training;Stair training;Therapeutic activities;Therapeutic exercise;Functional mobility training;Balance training;Neuromuscular re-education;Patient/family education;Manual techniques;Dry needling;Vestibular;Joint Manipulations;Spinal Manipulations;Aquatic Therapy    PT Next Visit Plan Continue to focus on LE strengthening and balance    PT Home Exercise Plan advance as tolerated;    Consulted and Agree with Plan of Care Patient    Family Member Consulted granddaughter           Patient will benefit from skilled therapeutic intervention in order to improve the following deficits and impairments:  Abnormal gait,Decreased balance,Decreased mobility,Difficulty walking,Decreased knowledge of precautions,Decreased safety awareness,Decreased activity tolerance,Decreased strength,Decreased endurance  Visit Diagnosis: Muscle weakness (generalized)  Unsteadiness on feet  Decreased strength of lower extremity  Other lack of coordination  Other abnormalities of gait and mobility  Right middle cerebral artery stroke ([facility]     Problem List Patient Active Problem List   Diagnosis Date Noted  . Aneurysm, aorta, thoracic ([place] 12/21/2020  . Calculus of bile duct without cholecystitis and without obstruction   . Non-small cell carcinoma of lung, right ([place] 09/07/2020  . Mass of upper lobe of right lung 07/13/2020  . Calculus of bile duct with cholecystitis without obstruction   . Abnormal findings on imaging of biliary tract   . Acute cholecystitis 05/02/2020  . Chronic venous insufficiency 12/05/2019  . 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 CKD (chronic kidney disease), stage II   . New onset atrial fibrillation ([place]   .  Right middle cerebral artery stroke ([place] 05/27/2019  . Goals of care, counseling/discussion   . Palliative care  by specialist   . DNR (do not resuscitate) discussion   . History of CVA (cerebrovascular accident) 05/22/2019  . Persistent proteinuria 07/30/2018  . 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 Chronic kidney disease, stage III (moderate) ([place]) 06/15/2018  . Leg pain 05/06/2018  . Lymphedema 05/06/2018  . Coagulopathy ([place]) 07/07/2017  . Lumbar spondylosis 03/10/2017  . Elevated uric acid in blood 11/20/2016  . BPH (benign prostatic hyperplasia) 05/29/2016  . Peripheral vascular disease of lower extremity ([place]) 03/03/2016  . Gallstone 12/31/2015  . 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 Atherosclerosis of aorta ([place]) 11/22/2015  . Carotid artery narrowing 11/22/2015  . Diverticulosis of colon 11/22/2015  . Decreased creatinine clearance 11/22/2015  . Arthritis, degenerative 11/22/2015  . Lactose intolerance 11/22/2015  . Basal cell carcinoma 11/22/2015  . Essential hypertension 06/21/2015  . Hyperlipemia 06/21/2015  . GERD (gastroesophageal reflux disease) 06/21/2015  . Calculus of kidney 11/18/2013    Hal Morales PT, DPT 01/23/2021, 2:37 PM  [place] MAIN [facility] SERVICES [address] [place], [place], [address] Phone: [phone]   Fax:  [phone]  Name: RASOOL ROMMEL MRN: [id number] Date of Birth: 04-27-1926

## 2021-01-28 ENCOUNTER — Telehealth: Payer: Self-pay | Admitting: Family Medicine

## 2021-01-28 ENCOUNTER — Ambulatory Visit: Payer: Medicare Other

## 2021-01-28 ENCOUNTER — Other Ambulatory Visit: Payer: Self-pay

## 2021-01-28 DIAGNOSIS — M6281 Muscle weakness (generalized): Secondary | ICD-10-CM

## 2021-01-28 DIAGNOSIS — R29898 Other symptoms and signs involving the musculoskeletal system: Secondary | ICD-10-CM

## 2021-01-28 DIAGNOSIS — R278 Other lack of coordination: Secondary | ICD-10-CM

## 2021-01-28 DIAGNOSIS — R2681 Unsteadiness on feet: Secondary | ICD-10-CM

## 2021-01-28 NOTE — Telephone Encounter (Signed)
Copied from [place] [id number]. Topic: Medicare AWV >> Jan 28, 2021  1:27 PM Cher Nakai R wrote: Reason for CRM: No answer unable to leave a message for patient to call back and schedule Medicare Annual Wellness Visit (AWV) in office.   If unable to come into the office for AWV,  please offer to do virtually or by telephone.  Last AWV: 02/07/2020  Please schedule at anytime with [place].  40 minute appointment  Any questions, please contact me at [phone]

## 2021-01-28 NOTE — Therapy (Addendum)
[place] MAIN [facility] SERVICES [address] [place], [place], [address] Phone: [phone]   Fax:  [phone]  Physical Therapy Treatment  Patient Details  Name: Jose Castro MRN: [id number] Date of Birth: 16-Jun-1926 Referring Provider (PT): Dr. Ancil Boozer   Encounter Date: 01/28/2021   PT End of Session - 01/28/21 1157    Visit Number 27    Number of Visits 33    Date for PT Re-Evaluation 02/18/21    Authorization Type eval: 09/20/20, Progress note 01/02/2021    PT Start Time 1151    PT Stop Time 1231    PT Time Calculation (min) 40 min    Equipment Utilized During Treatment Gait belt    Activity Tolerance Patient tolerated treatment well;Patient limited by fatigue;Patient limited by pain    Behavior During Therapy [facility] for tasks assessed/performed           Past Medical History:  Diagnosis Date  . Allergy    Penicillin  . Arthritis   . Basal cell carcinoma   . BPH (benign prostatic hyperplasia)   . Chronic kidney disease    had a kidney stone which per family was a cyst that was removed  . Dysrhythmia   . GERD (gastroesophageal reflux disease)   . History of kidney stones   . Hyperlipidemia   . Hypertension   . Left-sided low back pain with left-sided sciatica   . Lung mass   . Splenic vein thrombosis   . Stroke ([place]) 05/22/2019  . Thrombosis 12/2015   mural  area and no notation of heart attack    Past Surgical History:  Procedure Laterality Date  . APPENDECTOMY    . CATARACT EXTRACTION, BILATERAL    . CYSTOSCOPY WITH INSERTION OF UROLIFT    . ENDOSCOPIC RETROGRADE CHOLANGIOPANCREATOGRAPHY (ERCP) WITH PROPOFOL N/A 05/11/2020   Procedure: ENDOSCOPIC RETROGRADE CHOLANGIOPANCREATOGRAPHY (ERCP) WITH PROPOFOL;  Surgeon: [name], MD;  Location: [facility] ENDOSCOPY;  Service: Endoscopy;  Laterality: N/A;  . ERCP N/A 10/23/2020   Procedure: ENDOSCOPIC RETROGRADE CHOLANGIOPANCREATOGRAPHY (ERCP);  Surgeon: [name], MD;  Location:  [facility] ENDOSCOPY;  Service: Endoscopy;  Laterality: N/A;  . EYE SURGERY  08/2018  . IR EXCHANGE BILIARY DRAIN  05/04/2020  . KIDNEY STONE SURGERY    . KNEE SURGERY    . PACEMAKER INSERTION N/A 07/13/2018   Procedure: INSERTION PACEMAKER-DUEL CHAMBER INITIAL IMPLANT;  Surgeon: Isaias Cowman, MD;  Location: [facility] ORS;  Service: Cardiovascular;  Laterality: N/A;  . SPINE SURGERY      There were no vitals filed for this visit.   Subjective Assessment - 01/28/21 1156    Subjective Pt reports no new complaints. Caregiver reports pt and his family are concerned about his weight loss. No falls reported.    Patient is accompained by: Family member    Pertinent History Pt referred for deconditioning and gait instability. He has had a decline since he last finished therapy. Pt had a hospital admission from 05/02/20 to 05/14/20 for acute cholecystitis and is now s/p cholecystostomy drain placement. Drain is still in place and pt is waiting to hear when they plan to remove it. Pt discharged to SNF and then returned home. In addition pt underwent radiation therapy for a RUL lung mass/cancer and had his last radiation treatment in September of 2021. He reports that he has continued to walk at home as his primary exercise. Pt was previously seen at this clinic for PT/OT due to weakness/imbalance s/p CVA. 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  He has completed inpatient and home health PT, and now presents for OP PT.  He was discharged from inpatient rehab at a supervision level of assistance, ambulating with a RW.  His family notes that he is impulsive and will try to ambulate without his RW at home.  Family is currently providing 24/7 supervision.    Limitations House hold activities;Walking    How long can you sit comfortably? no limitations    How long can you walk comfortably? he  requires a RW    Patient Stated Goals Pt wants to be able to walk his granddaughter down the aisle at her wedding November 2022    Currently in Pain? No/denies    Pain Score 0-No pain    Pain Onset More than a month ago           INTERVENTION THIS DATE:  There.ex:   Nu-Step L1 for 5 min. UE/LE use with SPM >70. Not billed.  Neuro Re-Ed:   Forward walking in // bars with BUE support progressed to single UE support: stepping over orange hurdles with reciprocal pattern: x4. CGA+1   Progressed to 4 lbs AW's for added resistance: x2  Side steps over orange hurdles in // bars x4. Progressed from BUE to single UE support to improve hip stability. CGA+1   Progressed to 4 lbs AW's for added resistance: x2  Alternating foot taps on 1/2 bolster to improve coordination. 2x30 sec. 2x30 sec with increased speed. Noted difficulty to maintain coordination with increased speed. Supervision and use of BUE's on // bars  Standing on airex pad: basketball passes incorporating chest passes with bounce passes outside BOS to challenge dynamic balance. CGA+1. Multiple bouts of post lean noted requiring minA+1 from therapist to correct along with multiple VC/TC on hips to correct. Two bouts at 1 min each. CGA+1 throughout.     Seated rest breaks between exercises needed.    PT Education - 01/28/21 1505    Education Details form/technique with exercise.    Person(s) Educated Patient    Methods Explanation;Demonstration;Tactile cues;Verbal cues    Comprehension Verbalized understanding;Returned demonstration            PT Short Term Goals - 01/21/21 1438      PT SHORT TERM GOAL #1   Title Pt will be independent with HEP in order to improve strength and balance in order to decrease fall risk and improve function at home.    Baseline 01/02/2021- Patient verbalize understanding of walking and seated exercises for home program. 01/21/21 patient has been more active and understands HEP.    Time 6    Period  Weeks    Status Achieved    Target Date 01/10/21             PT Long Term Goals - 01/21/21 1413      PT LONG TERM GOAL #1   Title Pt will improve BERG by at least 3 points in order to demonstrate clinically significant improvement in balance.    Baseline 09/20/20: 32/56 (Previous discharge: 03/06/20: 46/56), 12/14: 32/56, 1/13: 31/56, 01/16/21 35/56    Time 4    Period Weeks    Status Partially Met    Target Date 02/18/21      PT LONG TERM GOAL #2   Title Pt will decrease TUG to below 14 seconds/decrease in order  to demonstrate decreased fall risk.    Baseline 09/20/20: 19.2s, 12/14: 17.48, 1/13: 16.65 sec. 01/02/2021=15.75 sec using 4WW, 01/21/21  14.37 seconds    Time 4    Period Weeks    Status On-going    Target Date 02/18/21      PT LONG TERM GOAL #3   Title Pt will decrease 5TSTS by at least 3 seconds in order to demonstrate clinically significant improvement in LE strength.    Baseline 09/20/20: 23.1s with minA+1 for strength and balance (Previous discharge 03/06/20: 16.1s), 12/14: 20 sec, 1/13: 22 sec.   01/02/2021= 16.05 sec with light UE support. 01/21/21 01/21/21 37.90 seconds without UE, with UE support 12.93 seconds    Time 4    Period Weeks    Status On-going    Target Date 02/18/21      PT LONG TERM GOAL #4   Title Pt will increase his self-selected 10MWT to >0.74 m/s in order to demonstrate improvement in gait speed and improved community ambulation    Baseline 09/20/20: self-selected: 14.4s = 0.69 m/s with rollator (Previous discharge 03/06/20: self-selected: 13.6s = 0.74 m/s), 12/14: 0.75 m/s, 1/13: 0.78 m/s. 01/02/2021= 0.85 m/s using 4WW, 01/16/21 with 4 WW .99 m/s    Time 4    Period Weeks    Status Partially Met    Target Date 02/18/21      PT LONG TERM GOAL #5   Title Patient will increase six minute walk test distance to >1000 for progression to community ambulator and improve gait ability    Baseline 01/10/20  620 ft with rollator, 01/21/21  965 ft    Status  Partially Met    Target Date 02/18/21                 Plan - 01/28/21 1506    Clinical Impression Statement Today's session focused on dynamic balance. Pt demonstrating good reciprocal pattern over hurdles with BUE support on // bars. Pt progressed from BUE support to single UE BOS with same task with decreased time to perform due to less balance. Performed in frontal plae as well for hip stabilization strength. When standing on airex pad performing ball tosses pt displayed significant post lean requiring minA+1 from therapist and mod VC/TC on hips to correct. Pt can continue to benefit from skilled PT treatment to improve functional mobility.    Personal Factors and Comorbidities Age;Comorbidity 3+    Comorbidities HTN, CVA, cholecystitis with cholecystostomy drain    Examination-Activity Limitations Bend;Lift;Squat;Locomotion Level;Stairs;Stand    Examination-Participation Restrictions Community Activity;Driving;Laundry;Meal Prep;Shop    Stability/Clinical Decision Making Unstable/Unpredictable    Rehab Potential Fair    PT Frequency 2x / week    PT Duration 4 weeks    PT Treatment/Interventions ADLs/Self Care Home Management;Canalith Repostioning;Cryotherapy;Electrical Stimulation;Iontophoresis 16m/ml Dexamethasone;Moist Heat;Traction;Ultrasound;DME Instruction;Gait training;Stair training;Therapeutic activities;Therapeutic exercise;Functional mobility training;Balance training;Neuromuscular re-education;Patient/family education;Manual techniques;Dry needling;Vestibular;Joint Manipulations;Spinal Manipulations;Aquatic Therapy    PT Next Visit Plan Continue to focus on LE strengthening and balance    PT Home Exercise Plan advance as tolerated;    Consulted and Agree with Plan of Care Patient    Family Member Consulted granddaughter           Patient will benefit from skilled therapeutic intervention in order to improve the following deficits and impairments:  Abnormal gait,Decreased  balance,Decreased mobility,Difficulty walking,Decreased knowledge of precautions,Decreased safety awareness,Decreased activity tolerance,Decreased strength,Decreased endurance  Visit Diagnosis: Muscle weakness (generalized)  Unsteadiness on feet  Decreased strength of lower extremity  Other lack  of coordination     Problem List Patient Active Problem List   Diagnosis Date Noted  . Aneurysm, aorta, thoracic ([place]) 12/21/2020  . Calculus of bile duct without cholecystitis and without obstruction   . Non-small cell carcinoma of lung, right ([place]) 09/07/2020  . Mass of upper lobe of right lung 07/13/2020  . Calculus of bile duct with cholecystitis without obstruction   . Abnormal findings on imaging of biliary tract   . Acute cholecystitis 05/02/2020  . Chronic venous insufficiency 12/05/2019  . 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 CKD (chronic kidney disease), stage II   . New onset atrial fibrillation ([place])   . 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 Goals of care, counseling/discussion   . Palliative care by specialist   . DNR (do not resuscitate) discussion   . History of CVA (cerebrovascular accident) 05/22/2019  . Persistent proteinuria 07/30/2018  . 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 Chronic kidney disease, stage III (moderate) ([place]) 06/15/2018  . Leg pain 05/06/2018  . Lymphedema 05/06/2018  . Coagulopathy ([place]) 07/07/2017  . Lumbar spondylosis 03/10/2017  . Elevated uric acid in blood 11/20/2016  . BPH (benign prostatic hyperplasia) 05/29/2016  . Peripheral vascular disease of lower extremity ([place]) 03/03/2016  . Gallstone 12/31/2015  . 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 Atherosclerosis of aorta ([place]) 11/22/2015  . Carotid artery narrowing 11/22/2015  . Diverticulosis of colon 11/22/2015  . Decreased creatinine clearance 11/22/2015  . Arthritis, degenerative 11/22/2015  . Lactose  intolerance 11/22/2015  . Basal cell carcinoma 11/22/2015  . Essential hypertension 06/21/2015  . Hyperlipemia 06/21/2015  . GERD (gastroesophageal reflux disease) 06/21/2015  . Calculus of kidney 11/18/2013   5:11 PM, 01/28/21 Etta Grandchild, PT, DPT Physical Therapist - [facility]  [facility] [place], IV, PT,DPT 01/28/2021, 3:22 PM  [place] MAIN [facility] SERVICES [address] [place], [place], [address] Phone: [phone]   Fax:  [phone]  Name: Jose Castro MRN: [id number] Date of Birth: [birth year]

## 2021-01-30 ENCOUNTER — Ambulatory Visit: Payer: Medicare Other | Attending: Family Medicine

## 2021-01-30 ENCOUNTER — Other Ambulatory Visit: Payer: Self-pay

## 2021-01-30 VITALS — BP 110/64 | HR 86

## 2021-01-30 DIAGNOSIS — R29898 Other symptoms and signs involving the musculoskeletal system: Secondary | ICD-10-CM | POA: Insufficient documentation

## 2021-01-30 DIAGNOSIS — R278 Other lack of coordination: Secondary | ICD-10-CM | POA: Diagnosis present

## 2021-01-30 DIAGNOSIS — M6281 Muscle weakness (generalized): Secondary | ICD-10-CM | POA: Diagnosis present

## 2021-01-30 DIAGNOSIS — M25511 Pain in right shoulder: Secondary | ICD-10-CM | POA: Diagnosis present

## 2021-01-30 DIAGNOSIS — R2689 Other abnormalities of gait and mobility: Secondary | ICD-10-CM | POA: Diagnosis present

## 2021-01-30 DIAGNOSIS — R2681 Unsteadiness on feet: Secondary | ICD-10-CM | POA: Insufficient documentation

## 2021-01-30 NOTE — Therapy (Signed)
[place] MAIN [facility] SERVICES [address] [place], [place], [address] Phone: [phone]   Fax:  [phone]  Physical Therapy Treatment  Patient Details  Name: Jose Castro MRN: [id number] Date of Birth: 04/06/1926 Referring Provider (PT): Dr. Ancil Boozer   Encounter Date: 01/30/2021   PT End of Session - 01/30/21 1413    Visit Number 28    Number of Visits 33    Date for PT Re-Evaluation 02/18/21    Authorization Type eval: 09/20/20, Progress note 01/02/2021    PT Start Time 1150    PT Stop Time 1231    PT Time Calculation (min) 41 min    Equipment Utilized During Treatment Gait belt    Activity Tolerance Patient tolerated treatment well    Behavior During Therapy [facility] for tasks assessed/performed           Past Medical History:  Diagnosis Date  . Allergy    Penicillin  . Arthritis   . Basal cell carcinoma   . BPH (benign prostatic hyperplasia)   . Chronic kidney disease    had a kidney stone which per family was a cyst that was removed  . Dysrhythmia   . GERD (gastroesophageal reflux disease)   . History of kidney stones   . Hyperlipidemia   . Hypertension   . Left-sided low back pain with left-sided sciatica   . Lung mass   . Splenic vein thrombosis   . Stroke ([place]) 05/22/2019  . Thrombosis 12/2015   mural  area and no notation of heart attack    Past Surgical History:  Procedure Laterality Date  . APPENDECTOMY    . CATARACT EXTRACTION, BILATERAL    . CYSTOSCOPY WITH INSERTION OF UROLIFT    . ENDOSCOPIC RETROGRADE CHOLANGIOPANCREATOGRAPHY (ERCP) WITH PROPOFOL N/A 05/11/2020   Procedure: ENDOSCOPIC RETROGRADE CHOLANGIOPANCREATOGRAPHY (ERCP) WITH PROPOFOL;  Surgeon: [name], MD;  Location: [facility] ENDOSCOPY;  Service: Endoscopy;  Laterality: N/A;  . ERCP N/A 10/23/2020   Procedure: ENDOSCOPIC RETROGRADE CHOLANGIOPANCREATOGRAPHY (ERCP);  Surgeon: [name], MD;  Location: [facility] ENDOSCOPY;  Service: Endoscopy;  Laterality:  N/A;  . EYE SURGERY  08/2018  . IR EXCHANGE BILIARY DRAIN  05/04/2020  . KIDNEY STONE SURGERY    . KNEE SURGERY    . PACEMAKER INSERTION N/A 07/13/2018   Procedure: INSERTION PACEMAKER-DUEL CHAMBER INITIAL IMPLANT;  Surgeon: Isaias Cowman, MD;  Location: [facility] ORS;  Service: Cardiovascular;  Laterality: N/A;  . SPINE SURGERY      Vitals:   01/30/21 1431  BP: 110/64  Pulse: 86     Subjective Assessment - 01/30/21 1410    Subjective Pt presents with caregiver. Pt reports R shoulder pain and says "I think it's bursitis." He reports it started a couple days ago and that R shoulder pain is currently 7/10. Pt says pain "travels into arm," and that it's "achey" in nature.    Patient is accompained by: Family member    Pertinent History Pt referred for deconditioning and gait instability. He has had a decline since he last finished therapy. Pt had a hospital admission from 05/02/20 to 05/14/20 for acute cholecystitis and is now s/p cholecystostomy drain placement. Drain is still in place and pt is waiting to hear when they plan to remove it. Pt discharged to SNF and then returned home. In addition pt underwent radiation therapy for a RUL lung mass/cancer and had his last radiation treatment in September of 2021. He reports that he has continued to walk at  home as his primary exercise. Pt was previously seen at this clinic for PT/OT due to weakness/imbalance s/p CVA. Prior history from 09/21/19: Pt is a 87 year old male who presents with imbalance and difficulty with gait following a right frontal CVA with left hemiparesis on 05/22/19.  He has completed inpatient and home health PT, and now presents for OP PT.  He was discharged from inpatient rehab at a supervision level of assistance, ambulating with a RW.  His family notes that he is impulsive and will try to ambulate without his RW at home.  Family is currently providing 24/7 supervision.    Limitations House hold activities;Walking    How long can you  sit comfortably? no limitations    How long can you walk comfortably? he requires a RW    Patient Stated Goals Pt wants to be able to walk his granddaughter down the aisle at her wedding November 2022    Currently in Pain? Yes    Pain Score 7     Pain Location Shoulder    Pain Orientation Right    Pain Descriptors / Indicators Aching    Pain Type Acute pain    Pain Onset In the past 7 days         Treatment-  Therapeutic Exercise:   R UE: pt with minor swelling noted R hand/wrist. Scabbing on R wrist (pt denies any trauma to area or falls). Area is not tender to palpation.  Nu-Step L1 for 2 min. UE/LE use with SPM >70s. Level 2 x 2 minutes.  (unbilled)  STS - 3x10; pt rates exercise as "medium"  Standing heel raises3 x 20 reps  Forward/backward stepping onto 6" step without UE support on bar - x multiple reps, CGA  Lateral stepping (R and L) onto 6" step  Side stpping forward/backward onto 6" step   Neuromuscular Re-education:   Standing on bosu - x3 minutes, BUE support to intermittent UE support and CGA-min assist. Pt required min assist for mount/dismount.  Standing at bar feet together - 4x30 sec, intermittent UE support, CGA.   SLB - 3x30 sec R LE, 1x30 sec LLE (discontinued due to reports of L knee pain that resolved out of position). Pt required frequent intermittent UE support so instructed pt to perform with at least UUE support.  Semi-tandem stance - R LE as stance LE 2x30 sec, CGA, UUE support. Pt requires redirection to task, did not end up attempting LLE as primary stance leg.                      PT Education - 01/30/21 1412    Education Details technique with exercise, body mechanics    Person(s) Educated Patient    Methods Explanation;Verbal cues;Demonstration    Comprehension Verbalized understanding;Returned demonstration            PT Short Term Goals - 01/21/21 1438      PT SHORT TERM GOAL #1   Title Pt will be independent  with HEP in order to improve strength and balance in order to decrease fall risk and improve function at home.    Baseline 01/02/2021- Patient verbalize understanding of walking and seated exercises for home program. 01/21/21 patient has been more active and understands HEP.    Time 6    Period Weeks    Status Achieved    Target Date 01/10/21             PT Long Term Goals - 01/21/21  [place] #1   Title Pt will improve BERG by at least 3 points in order to demonstrate clinically significant improvement in balance.    Baseline 09/20/20: 32/56 (Previous discharge: 03/06/20: 46/56), 12/14: 32/56, 1/13: 31/56, 01/16/21 35/56    Time 4    Period Weeks    Status Partially Met    Target Date 02/18/21      PT LONG TERM GOAL #2   Title Pt will decrease TUG to below 14 seconds/decrease in order to demonstrate decreased fall risk.    Baseline 09/20/20: 19.2s, 12/14: 17.48, 1/13: 16.65 sec. 01/02/2021=15.75 sec using 4WW, 01/21/21  14.37 seconds    Time 4    Period Weeks    Status On-going    Target Date 02/18/21      PT LONG TERM GOAL #3   Title Pt will decrease 5TSTS by at least 3 seconds in order to demonstrate clinically significant improvement in LE strength.    Baseline 09/20/20: 23.1s with minA+1 for strength and balance (Previous discharge 03/06/20: 16.1s), 12/14: 20 sec, 1/13: 22 sec.   01/02/2021= 16.05 sec with light UE support. 01/21/21 01/21/21 37.90 seconds without UE, with UE support 12.93 seconds    Time 4    Period Weeks    Status On-going    Target Date 02/18/21      PT LONG TERM GOAL #4   Title Pt will increase his self-selected 10MWT to >0.74 m/s in order to demonstrate improvement in gait speed and improved community ambulation    Baseline 09/20/20: self-selected: 14.4s = 0.69 m/s with rollator (Previous discharge 03/06/20: self-selected: 13.6s = 0.74 m/s), 12/14: 0.75 m/s, 1/13: 0.78 m/s. 01/02/2021= 0.85 m/s using 4WW, 01/16/21 with 4 [employer] .99 m/s    Time 4     Period Weeks    Status Partially Met    Target Date 02/18/21      PT LONG TERM GOAL #5   Title Patient will increase six minute walk test distance to >1000 for progression to community ambulator and improve gait ability    Baseline 01/10/20  620 ft with rollator, 01/21/21  965 ft    Status Partially Met    Target Date 02/18/21            Patient will benefit from skilled therapeutic intervention in order to improve the following deficits and impairments:  Abnormal gait,Decreased balance,Decreased mobility,Difficulty walking,Decreased knowledge of precautions,Decreased safety awareness,Decreased activity tolerance,Decreased strength,Decreased endurance  Visit Diagnosis: Muscle weakness (generalized)  Unsteadiness on feet  Other abnormalities of gait and mobility  Acute pain of right shoulder     Problem List Patient Active Problem List   Diagnosis Date Noted  . Aneurysm, aorta, thoracic ([place]) 12/21/2020  . Calculus of bile duct without cholecystitis and without obstruction   . Non-small cell carcinoma of lung, right ([place]) 09/07/2020  . Mass of upper lobe of right lung 07/13/2020  . Calculus of bile duct with cholecystitis without obstruction   . Abnormal findings on imaging of biliary tract   . Acute cholecystitis 05/02/2020  . Chronic venous insufficiency 12/05/2019  . 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 CKD (chronic kidney disease), stage II   . New onset atrial fibrillation ([place])   . 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 Goals of care, counseling/discussion   . Palliative care by specialist   . DNR (do not resuscitate) discussion   . History of CVA (  cerebrovascular accident) 05/22/2019  . Persistent proteinuria 07/30/2018  . 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 Chronic kidney disease, stage III (moderate) ([place]) 06/15/2018  . Leg pain 05/06/2018  . Lymphedema 05/06/2018  . Coagulopathy ([place]) 07/07/2017  . Lumbar  spondylosis 03/10/2017  . Elevated uric acid in blood 11/20/2016  . BPH (benign prostatic hyperplasia) 05/29/2016  . Peripheral vascular disease of lower extremity ([place]) 03/03/2016  . Gallstone 12/31/2015  . 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 Atherosclerosis of aorta ([place]) 11/22/2015  . Carotid artery narrowing 11/22/2015  . Diverticulosis of colon 11/22/2015  . Decreased creatinine clearance 11/22/2015  . Arthritis, degenerative 11/22/2015  . Lactose intolerance 11/22/2015  . Basal cell carcinoma 11/22/2015  . Essential hypertension 06/21/2015  . Hyperlipemia 06/21/2015  . GERD (gastroesophageal reflux disease) 06/21/2015  . Calculus of kidney 11/18/2013   Ricard Dillon PT, DPT 01/30/2021, 2:46 PM  [place] MAIN [facility] SERVICES [address] [place], [place], [address] Phone: [phone]   Fax:  [phone]  Name: Jose Castro MRN: [id number] Date of Birth: 1926-08-14

## 2021-02-04 ENCOUNTER — Ambulatory Visit: Payer: Medicare Other

## 2021-02-04 ENCOUNTER — Other Ambulatory Visit: Payer: Self-pay

## 2021-02-04 DIAGNOSIS — R2681 Unsteadiness on feet: Secondary | ICD-10-CM

## 2021-02-04 DIAGNOSIS — M6281 Muscle weakness (generalized): Secondary | ICD-10-CM | POA: Diagnosis not present

## 2021-02-04 DIAGNOSIS — R2689 Other abnormalities of gait and mobility: Secondary | ICD-10-CM

## 2021-02-04 NOTE — Therapy (Signed)
[place] MAIN [facility] SERVICES [address] [place], [place], [address] Phone: [phone]   Fax:  [phone]  Physical Therapy Treatment  Patient Details  Name: Jose Castro MRN: [id number] Date of Birth: 1926/02/14 Referring Provider (PT): Dr. Ancil Boozer   Encounter Date: 02/04/2021   PT End of Session - 02/04/21 1502    Visit Number 29    Number of Visits 33    Date for PT Re-Evaluation 02/18/21    Authorization Type eval: 09/20/20, Progress note 01/02/2021    PT Start Time 2426    PT Stop Time 1429    PT Time Calculation (min) 36 min    Equipment Utilized During Treatment Gait belt    Activity Tolerance Patient tolerated treatment well    Behavior During Therapy [facility] for tasks assessed/performed           Past Medical History:  Diagnosis Date  . Allergy    Penicillin  . Arthritis   . Basal cell carcinoma   . BPH (benign prostatic hyperplasia)   . Chronic kidney disease    had a kidney stone which per family was a cyst that was removed  . Dysrhythmia   . GERD (gastroesophageal reflux disease)   . History of kidney stones   . Hyperlipidemia   . Hypertension   . Left-sided low back pain with left-sided sciatica   . Lung mass   . Splenic vein thrombosis   . Stroke ([place]) 05/22/2019  . Thrombosis 12/2015   mural  area and no notation of heart attack    Past Surgical History:  Procedure Laterality Date  . APPENDECTOMY    . CATARACT EXTRACTION, BILATERAL    . CYSTOSCOPY WITH INSERTION OF UROLIFT    . ENDOSCOPIC RETROGRADE CHOLANGIOPANCREATOGRAPHY (ERCP) WITH PROPOFOL N/A 05/11/2020   Procedure: ENDOSCOPIC RETROGRADE CHOLANGIOPANCREATOGRAPHY (ERCP) WITH PROPOFOL;  Surgeon: [name], MD;  Location: [facility] ENDOSCOPY;  Service: Endoscopy;  Laterality: N/A;  . ERCP N/A 10/23/2020   Procedure: ENDOSCOPIC RETROGRADE CHOLANGIOPANCREATOGRAPHY (ERCP);  Surgeon: [name], MD;  Location: [facility] ENDOSCOPY;  Service: Endoscopy;  Laterality:  N/A;  . EYE SURGERY  08/2018  . IR EXCHANGE BILIARY DRAIN  05/04/2020  . KIDNEY STONE SURGERY    . KNEE SURGERY    . PACEMAKER INSERTION N/A 07/13/2018   Procedure: INSERTION PACEMAKER-DUEL CHAMBER INITIAL IMPLANT;  Surgeon: Isaias Cowman, MD;  Location: [facility] ORS;  Service: Cardiovascular;  Laterality: N/A;  . SPINE SURGERY      There were no vitals filed for this visit.   Subjective Assessment - 02/04/21 1446    Subjective Patient reports some ongoing left soreness but otherwise doing okay today.    Patient is accompained by: Family member    Pertinent History Pt referred for deconditioning and gait instability. He has had a decline since he last finished therapy. Pt had a hospital admission from 05/02/20 to 05/14/20 for acute cholecystitis and is now s/p cholecystostomy drain placement. Drain is still in place and pt is waiting to hear when they plan to remove it. Pt discharged to SNF and then returned home. In addition pt underwent radiation therapy for a RUL lung mass/cancer and had his last radiation treatment in September of 2021. He reports that he has continued to walk at home as his primary exercise. Pt was previously seen at this clinic for PT/OT due to weakness/imbalance s/p CVA. Prior history from 09/21/19: Pt is a 87 year old male who presents with imbalance and difficulty with gait following  a right frontal CVA with left hemiparesis on 05/22/19.  He has completed inpatient and home health PT, and now presents for OP PT.  He was discharged from inpatient rehab at a supervision level of assistance, ambulating with a RW.  His family notes that he is impulsive and will try to ambulate without his RW at home.  Family is currently providing 24/7 supervision.    Limitations House hold activities;Walking    How long can you sit comfortably? no limitations    How long can you walk comfortably? he requires a RW    Patient Stated Goals Pt wants to be able to walk his granddaughter down the aisle  at her wedding November 2022    Currently in Pain? Yes    Pain Score 2     Pain Location Knee    Pain Orientation Left    Pain Descriptors / Indicators Aching    Pain Type Chronic pain    Pain Onset More than a month ago    Aggravating Factors  Prolonged sitting/standing.    Pain Relieving Factors Rest          Therex:  STS - 2 x 10 reps with minimal B UE support  Standing heel raises3 x 20 reps  Forward/backward stepping onto purple pad with min UE support on bar - x 15  reps, CGA     Neuromuscular Re-education:   Standing on bosu -  No UE support  X 10-15 sec x 4 trials with CGA- Pt required min assist for mount/dismount. Patient unsteady initially yet did improve with practice.   Standing at bar: side stepping over hurdle x 15 reps each direction.   Forward and retro gait at bar x 4 steps forward/back focusing on increased step length rather than shuffling.   Step tapping onto cone x 15 reps each foot  Feet together - 4x30 sec, intermittent UE support, CGA.     Semi-tandem stance - R LE as stance LE 2x30 sec, CGA, UUE support. Semi-tandem stance-  On purple pad x 30 sec x 2 trials each leg as stance.                                        PT Education - 02/04/21 1501    Education Details Specific exercise cues.    Person(s) Educated Patient    Methods Explanation;Demonstration;Tactile cues;Verbal cues    Comprehension Verbalized understanding;Returned demonstration;Verbal cues required;Need further instruction            PT Short Term Goals - 01/21/21 1438      PT SHORT TERM GOAL #1   Title Pt will be independent with HEP in order to improve strength and balance in order to decrease fall risk and improve function at home.    Baseline 01/02/2021- Patient verbalize understanding of walking and seated exercises for home program. 01/21/21 patient has been more active and understands HEP.    Time 6    Period Weeks    Status  Achieved    Target Date 01/10/21             PT Long Term Goals - 01/21/21 1413      PT LONG TERM GOAL #1   Title Pt will improve BERG by at least 3 points in order to demonstrate clinically significant improvement in balance.    Baseline 09/20/20: 32/56 (Previous discharge: 03/06/20: 46/56), 12/14:  32/56, 1/13: 31/56, 01/16/21 35/56    Time 4    Period Weeks    Status Partially Met    Target Date 02/18/21      PT LONG TERM GOAL #2   Title Pt will decrease TUG to below 14 seconds/decrease in order to demonstrate decreased fall risk.    Baseline 09/20/20: 19.2s, 12/14: 17.48, 1/13: 16.65 sec. 01/02/2021=15.75 sec using 4WW, 01/21/21  14.37 seconds    Time 4    Period Weeks    Status On-going    Target Date 02/18/21      PT LONG TERM GOAL #3   Title Pt will decrease 5TSTS by at least 3 seconds in order to demonstrate clinically significant improvement in LE strength.    Baseline 09/20/20: 23.1s with minA+1 for strength and balance (Previous discharge 03/06/20: 16.1s), 12/14: 20 sec, 1/13: 22 sec.   01/02/2021= 16.05 sec with light UE support. 01/21/21 01/21/21 37.90 seconds without UE, with UE support 12.93 seconds    Time 4    Period Weeks    Status On-going    Target Date 02/18/21      PT LONG TERM GOAL #4   Title Pt will increase his self-selected 10MWT to >0.74 m/s in order to demonstrate improvement in gait speed and improved community ambulation    Baseline 09/20/20: self-selected: 14.4s = 0.69 m/s with rollator (Previous discharge 03/06/20: self-selected: 13.6s = 0.74 m/s), 12/14: 0.75 m/s, 1/13: 0.78 m/s. 01/02/2021= 0.85 m/s using 4WW, 01/16/21 with 4 WW .99 m/s    Time 4    Period Weeks    Status Partially Met    Target Date 02/18/21      PT LONG TERM GOAL #5   Title Patient will increase six minute walk test distance to >1000 for progression to community ambulator and improve gait ability    Baseline 01/10/20  620 ft with rollator, 01/21/21  965 ft    Status Partially Met     Target Date 02/18/21                 Plan - 02/04/21 1503    Clinical Impression Statement Patient able to progress his balance and perform more activities without UE support. He did complaint on some soreness in left knee yet was not limiting with any of the activities today. Pt will benefit from further skilled PT to improve BLE strength, balance and functional mobility    Personal Factors and Comorbidities Age;Comorbidity 3+    Comorbidities HTN, CVA, cholecystitis with cholecystostomy drain    Examination-Activity Limitations Bend;Lift;Squat;Locomotion Level;Stairs;Stand    Examination-Participation Restrictions Community Activity;Driving;Laundry;Meal Prep;Shop    Stability/Clinical Decision Making Unstable/Unpredictable    Rehab Potential Fair    PT Frequency 2x / week    PT Duration 4 weeks    PT Treatment/Interventions ADLs/Self Care Home Management;Canalith Repostioning;Cryotherapy;Electrical Stimulation;Iontophoresis 33m/ml Dexamethasone;Moist Heat;Traction;Ultrasound;DME Instruction;Gait training;Stair training;Therapeutic activities;Therapeutic exercise;Functional mobility training;Balance training;Neuromuscular re-education;Patient/family education;Manual techniques;Dry needling;Vestibular;Joint Manipulations;Spinal Manipulations;Aquatic Therapy    PT Next Visit Plan Continue to focus on LE strengthening and balance, dynamic balance tasks    PT Home Exercise Plan advance as tolerated;    Consulted and Agree with Plan of Care Patient    Family Member Consulted granddaughter           Patient will benefit from skilled therapeutic intervention in order to improve the following deficits and impairments:  Abnormal gait,Decreased balance,Decreased mobility,Difficulty walking,Decreased knowledge of precautions,Decreased safety awareness,Decreased activity tolerance,Decreased strength,Decreased endurance  Visit Diagnosis: Muscle weakness (generalized)  Unsteadiness on  feet  Other abnormalities of gait and mobility     Problem List Patient Active Problem List   Diagnosis Date Noted  . Aneurysm, aorta, thoracic ([place]) 12/21/2020  . Calculus of bile duct without cholecystitis and without obstruction   . Non-small cell carcinoma of lung, right ([place]) 09/07/2020  . Mass of upper lobe of right lung 07/13/2020  . Calculus of bile duct with cholecystitis without obstruction   . Abnormal findings on imaging of biliary tract   . Acute cholecystitis 05/02/2020  . Chronic venous insufficiency 12/05/2019  . 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 CKD (chronic kidney disease), stage II   . New onset atrial fibrillation ([place])   . 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 Goals of care, counseling/discussion   . Palliative care by specialist   . DNR (do not resuscitate) discussion   . History of CVA (cerebrovascular accident) 05/22/2019  . Persistent proteinuria 07/30/2018  . 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 Chronic kidney disease, stage III (moderate) ([place]) 06/15/2018  . Leg pain 05/06/2018  . Lymphedema 05/06/2018  . Coagulopathy ([place]) 07/07/2017  . Lumbar spondylosis 03/10/2017  . Elevated uric acid in blood 11/20/2016  . BPH (benign prostatic hyperplasia) 05/29/2016  . Peripheral vascular disease of lower extremity ([place]) 03/03/2016  . Gallstone 12/31/2015  . 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 Atherosclerosis of aorta ([place]) 11/22/2015  . Carotid artery narrowing 11/22/2015  . Diverticulosis of colon 11/22/2015  . Decreased creatinine clearance 11/22/2015  . Arthritis, degenerative 11/22/2015  . Lactose intolerance 11/22/2015  . Basal cell carcinoma 11/22/2015  . Essential hypertension 06/21/2015  . Hyperlipemia 06/21/2015  . GERD (gastroesophageal reflux disease) 06/21/2015  . Calculus of kidney 11/18/2013    Lewis Moccasin, PT 02/05/2021, 12:50 PM  [place] MAIN [facility] SERVICES [address] [place], [place], [address] Phone: [phone]   Fax:  [phone]  Name: Jose Castro MRN: [id number] Date of Birth: 09-27-1926

## 2021-02-06 ENCOUNTER — Ambulatory Visit: Payer: Medicare Other

## 2021-02-06 ENCOUNTER — Other Ambulatory Visit: Payer: Self-pay

## 2021-02-06 DIAGNOSIS — M6281 Muscle weakness (generalized): Secondary | ICD-10-CM

## 2021-02-06 DIAGNOSIS — R2689 Other abnormalities of gait and mobility: Secondary | ICD-10-CM

## 2021-02-06 DIAGNOSIS — R29898 Other symptoms and signs involving the musculoskeletal system: Secondary | ICD-10-CM

## 2021-02-06 DIAGNOSIS — R2681 Unsteadiness on feet: Secondary | ICD-10-CM

## 2021-02-07 NOTE — Therapy (Signed)
[place] MAIN [facility] SERVICES [address] [place], [place], [address] Phone: [phone]   Fax:  [phone]  Physical Therapy Treatment/Physical Therapy Progress Note   Dates of reporting period  2/2/02022 to   02/06/2010  Patient Details  Name: Jose Castro MRN: [id number] Date of Birth: [birth year] Referring Provider (PT): Dr. Ancil Boozer   Encounter Date: 02/06/2021   PT End of Session - 02/06/21 1351    Visit Number 30    Number of Visits 33    Date for PT Re-Evaluation 02/18/21    Authorization Type eval: 09/20/20, Progress note 01/02/2021, PN on 02/06/2021    PT Start Time 1349    PT Stop Time 1429    PT Time Calculation (min) 40 min    Equipment Utilized During Treatment Gait belt    Activity Tolerance Patient tolerated treatment well    Behavior During Therapy [facility] for tasks assessed/performed           Past Medical History:  Diagnosis Date  . Allergy    Penicillin  . Arthritis   . Basal cell carcinoma   . BPH (benign prostatic hyperplasia)   . Chronic kidney disease    had a kidney stone which per family was a cyst that was removed  . Dysrhythmia   . GERD (gastroesophageal reflux disease)   . History of kidney stones   . Hyperlipidemia   . Hypertension   . Left-sided low back pain with left-sided sciatica   . Lung mass   . Splenic vein thrombosis   . Stroke ([place]) 05/22/2019  . Thrombosis 12/2015   mural  area and no notation of heart attack    Past Surgical History:  Procedure Laterality Date  . APPENDECTOMY    . CATARACT EXTRACTION, BILATERAL    . CYSTOSCOPY WITH INSERTION OF UROLIFT    . ENDOSCOPIC RETROGRADE CHOLANGIOPANCREATOGRAPHY (ERCP) WITH PROPOFOL N/A 05/11/2020   Procedure: ENDOSCOPIC RETROGRADE CHOLANGIOPANCREATOGRAPHY (ERCP) WITH PROPOFOL;  Surgeon: [name], MD;  Location: [facility] ENDOSCOPY;  Service: Endoscopy;  Laterality: N/A;  . ERCP N/A 10/23/2020   Procedure: ENDOSCOPIC RETROGRADE  CHOLANGIOPANCREATOGRAPHY (ERCP);  Surgeon: [name], MD;  Location: [facility] ENDOSCOPY;  Service: Endoscopy;  Laterality: N/A;  . EYE SURGERY  08/2018  . IR EXCHANGE BILIARY DRAIN  05/04/2020  . KIDNEY STONE SURGERY    . KNEE SURGERY    . PACEMAKER INSERTION N/A 07/13/2018   Procedure: INSERTION PACEMAKER-DUEL CHAMBER INITIAL IMPLANT;  Surgeon: Isaias Cowman, MD;  Location: [facility] ORS;  Service: Cardiovascular;  Laterality: N/A;  . SPINE SURGERY      There were no vitals filed for this visit.   Subjective Assessment - 02/06/21 1544    Subjective Patient reports no changes since last visit. States he is walking some and trying to stay active.    Patient is accompained by: Family member    Pertinent History Pt referred for deconditioning and gait instability. He has had a decline since he last finished therapy. Pt had a hospital admission from 05/02/20 to 05/14/20 for acute cholecystitis and is now s/p cholecystostomy drain placement. Drain is still in place and pt is waiting to hear when they plan to remove it. Pt discharged to SNF and then returned home. In addition pt underwent radiation therapy for a RUL lung mass/cancer and had his last radiation treatment in September of 2021. He reports that he has continued to walk at home as his primary exercise. Pt was previously seen at this clinic for  PT/OT due to weakness/imbalance s/p CVA. Prior history from 09/21/19: Pt is a 87 year old male who presents with imbalance and difficulty with gait following a right frontal CVA with left hemiparesis on 05/22/19.  He has completed inpatient and home health PT, and now presents for OP PT.  He was discharged from inpatient rehab at a supervision level of assistance, ambulating with a RW.  His family notes that he is impulsive and will try to ambulate without his RW at home.  Family is currently providing 24/7 supervision.    Limitations House hold activities;Walking    How long can you sit comfortably? no  limitations    How long can you walk comfortably? he requires a RW    Patient Stated Goals Pt wants to be able to walk his granddaughter down the aisle at her wedding November 2022    Currently in Pain? Yes    Pain Score --   Patient did not rate left knee soreness.   Pain Type Chronic pain    Pain Onset More than a month ago              [facility] PT Assessment - 02/06/21 1422      Berg Balance Test   Sit to Stand Able to stand  independently using hands    Standing Unsupported Able to stand 2 minutes with supervision    Sitting with Back Unsupported but Feet Supported on Floor or Stool Able to sit safely and securely 2 minutes    Stand to Sit Sits safely with minimal use of hands    Transfers Able to transfer safely, definite need of hands    Standing Unsupported with Eyes Closed Able to stand 10 seconds with supervision    Standing Unsupported with Feet Together Able to place feet together independently and stand for 1 minute with supervision    From Standing, Reach Forward with Outstretched Arm Can reach forward >12 cm safely (5")    From Standing Position, Pick up Object from Floor Able to pick up shoe, needs supervision    From Standing Position, Turn to Look Behind Over each Shoulder Turn sideways only but maintains balance    Turn 360 Degrees Needs close supervision or verbal cueing    Standing Unsupported, Alternately Place Feet on Step/Stool Able to complete >2 steps/needs minimal assist    Standing Unsupported, One Foot in Front Able to take small step independently and hold 30 seconds    Standing on One Leg Tries to lift leg/unable to hold 3 seconds but remains standing independently    Total Score 36           Treatment consisted of reassessment of  LTG today for progress note - see goal section for details. Patient demonstrated improvement with BERG balance, TUG, and gait speed today.                       PT Education - 02/07/21 1251    Education  Details Exercise form, test cues.    Person(s) Educated Patient    Methods Explanation;Demonstration;Tactile cues;Verbal cues    Comprehension Verbalized understanding;Returned demonstration;Verbal cues required;Tactile cues required;Need further instruction            PT Short Term Goals - 01/21/21 1438      PT SHORT TERM GOAL #1   Title Pt will be independent with HEP in order to improve strength and balance in order to decrease fall risk and improve function at home.  Baseline 01/02/2021- Patient verbalize understanding of walking and seated exercises for home program. 01/21/21 patient has been more active and understands HEP.    Time 6    Period Weeks    Status Achieved    Target Date 01/10/21             PT Long Term Goals - 02/06/21 1355      PT LONG TERM GOAL #1   Title Pt will improve BERG by at least 3 points in order to demonstrate clinically significant improvement in balance.    Baseline 09/20/20: 32/56 (Previous discharge: 03/06/20: 46/56), 12/14: 32/56, 1/13: 31/56, 01/16/21 35/56, 02/06/2021=36/56    Time 4    Period Weeks    Status On-going      PT LONG TERM GOAL #2   Title Pt will decrease TUG to below 14 seconds/decrease in order to demonstrate decreased fall risk.    Baseline 09/20/20: 19.2s, 12/14: 17.48, 1/13: 16.65 sec. 01/02/2021=15.75 sec using 4WW, 01/21/21  14.37 seconds. 02/06/2021= 14.92 sec using 4WW    Time 4    Period Weeks    Status On-going      PT LONG TERM GOAL #3   Title Pt will decrease 5TSTS by at least 3 seconds in order to demonstrate clinically significant improvement in LE strength.    Baseline 09/20/20: 23.1s with minA+1 for strength and balance (Previous discharge 03/06/20: 16.1s), 12/14: 20 sec, 1/13: 22 sec.   01/02/2021= 16.05 sec with light UE support. 01/21/21 01/21/21 37.90 seconds without UE, with UE support 12.93 seconds: 02/06/2021= with 1 UE 17.93 sec.    Time 4    Period Weeks    Status On-going      PT LONG TERM GOAL #4   Title  Pt will increase his self-selected 10MWT to >0.74 m/s in order to demonstrate improvement in gait speed and improved community ambulation    Baseline 09/20/20: self-selected: 14.4s = 0.69 m/s with rollator (Previous discharge 03/06/20: self-selected: 13.6s = 0.74 m/s), 12/14: 0.75 m/s, 1/13: 0.78 m/s. 01/02/2021= 0.85 m/s using 4WW, 01/16/21 with 4 WW .99 m/s. 02/06/2021= self selected = 0.93 m/s with 8GS    Time 4    Period Weeks    Status Achieved      PT LONG TERM GOAL #5   Title Patient will increase six minute walk test distance to >1000 for progression to community ambulator and improve gait ability    Baseline 01/10/20  620 ft with rollator, 01/21/21  965 ft; 02/06/2021= 910 feet with 4WW.    Status Partially Met                 Plan - 02/06/21 1252    Clinical Impression Statement Patient has made some progress since last progress note including improved BERG, Timed up and go test, and improved gait speed overall using his 4WW. He scored a 36/56 which continues to place him at using his walker for all safe mobility and encouraged patient to continue to use to decrease fall risk. He is now able to perform 5x Sit to stand with 1UE support and able to stand independently.Patient's condition has the potential to improve in response to therapy. Maximum improvement is yet to be obtained. The anticipated improvement is attainable and reasonable in a generally predictable time.    Personal Factors and Comorbidities Age;Comorbidity 3+    Comorbidities HTN, CVA, cholecystitis with cholecystostomy drain    Examination-Activity Limitations Bend;Lift;Squat;Locomotion Level;Stairs;Stand    Examination-Participation Restrictions Community Activity;Driving;Laundry;Meal Prep;Shop  Stability/Clinical Decision Making Unstable/Unpredictable    Rehab Potential Fair    PT Frequency 2x / week    PT Duration 4 weeks    PT Treatment/Interventions ADLs/Self Care Home Management;Canalith  Repostioning;Cryotherapy;Electrical Stimulation;Iontophoresis 64m/ml Dexamethasone;Moist Heat;Traction;Ultrasound;DME Instruction;Gait training;Stair training;Therapeutic activities;Therapeutic exercise;Functional mobility training;Balance training;Neuromuscular re-education;Patient/family education;Manual techniques;Dry needling;Vestibular;Joint Manipulations;Spinal Manipulations;Aquatic Therapy    PT Next Visit Plan Continue to focus on LE strengthening and balance, dynamic balance tasks    PT Home Exercise Plan advance as tolerated;    Consulted and Agree with Plan of Care Patient    Family Member Consulted granddaughter           Patient will benefit from skilled therapeutic intervention in order to improve the following deficits and impairments:  Abnormal gait,Decreased balance,Decreased mobility,Difficulty walking,Decreased knowledge of precautions,Decreased safety awareness,Decreased activity tolerance,Decreased strength,Decreased endurance  Visit Diagnosis: Muscle weakness (generalized)  Unsteadiness on feet  Other abnormalities of gait and mobility  Decreased strength of lower extremity     Problem List Patient Active Problem List   Diagnosis Date Noted  . Aneurysm, aorta, thoracic ([place] 12/21/2020  . Calculus of bile duct without cholecystitis and without obstruction   . Non-small cell carcinoma of lung, right ([place] 09/07/2020  . Mass of upper lobe of right lung 07/13/2020  . Calculus of bile duct with cholecystitis without obstruction   . Abnormal findings on imaging of biliary tract   . Acute cholecystitis 05/02/2020  . Chronic venous insufficiency 12/05/2019  . 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 CKD (chronic kidney disease), stage II   . New onset atrial fibrillation ([place]   . Right middle cerebral artery stroke ([place] 05/27/2019  . Goals of care, counseling/discussion   . Palliative care by specialist   . DNR (do not resuscitate) discussion   .  History of CVA (cerebrovascular accident) 05/22/2019  . Persistent proteinuria 07/30/2018  . 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 Chronic kidney disease, stage III (moderate) ([place] 06/15/2018  . Leg pain 05/06/2018  . Lymphedema 05/06/2018  . Coagulopathy ([place] 07/07/2017  . Lumbar spondylosis 03/10/2017  . Elevated uric acid in blood 11/20/2016  . BPH (benign prostatic hyperplasia) 05/29/2016  . Peripheral vascular disease of lower extremity ([place] 03/03/2016  . Gallstone 12/31/2015  . 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 Atherosclerosis of aorta ([place] 11/22/2015  . Carotid artery narrowing 11/22/2015  . Diverticulosis of colon 11/22/2015  . Decreased creatinine clearance 11/22/2015  . Arthritis, degenerative 11/22/2015  . Lactose intolerance 11/22/2015  . Basal cell carcinoma 11/22/2015  . Essential hypertension 06/21/2015  . Hyperlipemia 06/21/2015  . GERD (gastroesophageal reflux disease) 06/21/2015  . Calculus of kidney 11/18/2013    JLewis Moccasin PT 02/07/2021, 3:58 PM  CBell GardensMAIN [facility] HospitalSERVICES [address]alt [place] [address]Phone: [phone]  Fax:  [phone] Name: DARA MANOMRN: [id number] of Birth: 9April 12, 1927

## 2021-02-11 ENCOUNTER — Other Ambulatory Visit: Payer: Self-pay

## 2021-02-11 ENCOUNTER — Ambulatory Visit: Payer: Medicare Other

## 2021-02-11 DIAGNOSIS — M6281 Muscle weakness (generalized): Secondary | ICD-10-CM

## 2021-02-11 DIAGNOSIS — R29898 Other symptoms and signs involving the musculoskeletal system: Secondary | ICD-10-CM

## 2021-02-11 DIAGNOSIS — R2681 Unsteadiness on feet: Secondary | ICD-10-CM

## 2021-02-11 NOTE — Therapy (Signed)
[place] MAIN [facility] SERVICES [address] [place], [place], [address] Phone: [phone]   Fax:  [phone]  Physical Therapy Treatment  Patient Details  Name: Jose Castro MRN: [id number] Date of Birth: 1925/12/12 Referring Provider (PT): Dr. Ancil Boozer   Encounter Date: 02/11/2021   PT End of Session - 02/11/21 1358    Visit Number 31    Number of Visits 33    Date for PT Re-Evaluation 02/18/21    Authorization Type eval: 09/20/20, Progress note 01/02/2021, PN on 02/06/2021    PT Start Time 1354    PT Stop Time 1429    PT Time Calculation (min) 35 min    Equipment Utilized During Treatment Gait belt    Activity Tolerance Patient tolerated treatment well    Behavior During Therapy [facility] for tasks assessed/performed           Past Medical History:  Diagnosis Date  . Allergy    Penicillin  . Arthritis   . Basal cell carcinoma   . BPH (benign prostatic hyperplasia)   . Chronic kidney disease    had a kidney stone which per family was a cyst that was removed  . Dysrhythmia   . GERD (gastroesophageal reflux disease)   . History of kidney stones   . Hyperlipidemia   . Hypertension   . Left-sided low back pain with left-sided sciatica   . Lung mass   . Splenic vein thrombosis   . Stroke ([employer]) 05/22/2019  . Thrombosis 12/2015   mural  area and no notation of heart attack    Past Surgical History:  Procedure Laterality Date  . APPENDECTOMY    . CATARACT EXTRACTION, BILATERAL    . CYSTOSCOPY WITH INSERTION OF UROLIFT    . ENDOSCOPIC RETROGRADE CHOLANGIOPANCREATOGRAPHY (ERCP) WITH PROPOFOL N/A 05/11/2020   Procedure: ENDOSCOPIC RETROGRADE CHOLANGIOPANCREATOGRAPHY (ERCP) WITH PROPOFOL;  Surgeon: [name], MD;  Location: [facility] ENDOSCOPY;  Service: Endoscopy;  Laterality: N/A;  . ERCP N/A 10/23/2020   Procedure: ENDOSCOPIC RETROGRADE CHOLANGIOPANCREATOGRAPHY (ERCP);  Surgeon: [name], MD;  Location: [facility] ENDOSCOPY;  Service:  Endoscopy;  Laterality: N/A;  . EYE SURGERY  08/2018  . IR EXCHANGE BILIARY DRAIN  05/04/2020  . KIDNEY STONE SURGERY    . KNEE SURGERY    . PACEMAKER INSERTION N/A 07/13/2018   Procedure: INSERTION PACEMAKER-DUEL CHAMBER INITIAL IMPLANT;  Surgeon: Isaias Cowman, MD;  Location: [facility] ORS;  Service: Cardiovascular;  Laterality: N/A;  . SPINE SURGERY      There were no vitals filed for this visit.   Subjective Assessment - 02/11/21 1356    Subjective Patient reports no paricular pain and reports feeling well today. Denies any falls- able to go out to church.    Patient is accompained by: Family member    Pertinent History Pt referred for deconditioning and gait instability. He has had a decline since he last finished therapy. Pt had a hospital admission from 05/02/20 to 05/14/20 for acute cholecystitis and is now s/p cholecystostomy drain placement. Drain is still in place and pt is waiting to hear when they plan to remove it. Pt discharged to SNF and then returned home. In addition pt underwent radiation therapy for a RUL lung mass/cancer and had his last radiation treatment in September of 2021. He reports that he has continued to walk at home as his primary exercise. Pt was previously seen at this clinic for PT/OT due to weakness/imbalance s/p CVA. Prior history from 09/21/19: Pt is a  87 year old male who presents with imbalance and difficulty with gait following a right frontal CVA with left hemiparesis on 05/22/19.  He has completed inpatient and home health PT, and now presents for OP PT.  He was discharged from inpatient rehab at a supervision level of assistance, ambulating with a RW.  His family notes that he is impulsive and will try to ambulate without his RW at home.  Family is currently providing 24/7 supervision.    Limitations House hold activities;Walking    How long can you sit comfortably? no limitations    How long can you walk comfortably? he requires a RW    Patient Stated Goals  Pt wants to be able to walk his granddaughter down the aisle at her wedding November 2022    Currently in Pain? No/denies    Pain Onset More than a month ago             Neuromuscular re-ed:  Static stand with feet apart on airex blue pad with eyes open then closed x 20 sec x 3 trials each- mild unsteadiness yet no loss of balance. Static stand with feet together with eyes open then closed x 20 sec x 3 trials each with mild increase in unsteadiness yet no loss of balance with eyes open yet unable to recover with posterior lean- with eyes closed requiring multiple intermittent assist to maintain upright posture.   Basketball shoot - standing on blue airex pad initially with feet apart x 10 shots followed by feet together x 15 shots followed by feet staggered x 10 shots each.   Sit to stand holding onto red ball then shoot once in standing x 10 reps. Patient required close supervision with standing and presented with mild unsteadiness today.  Pt educated throughout session about proper posture and technique with exercises.I                        PT Education - 02/11/21 1357    Education Details balance and specific exercise cues.    Person(s) Educated Patient    Methods Explanation;Demonstration;Tactile cues;Verbal cues    Comprehension Verbalized understanding;Need further instruction;Returned demonstration;Verbal cues required;Tactile cues required            PT Short Term Goals - 01/21/21 1438      PT SHORT TERM GOAL #1   Title Pt will be independent with HEP in order to improve strength and balance in order to decrease fall risk and improve function at home.    Baseline 01/02/2021- Patient verbalize understanding of walking and seated exercises for home program. 01/21/21 patient has been more active and understands HEP.    Time 6    Period Weeks    Status Achieved    Target Date 01/10/21             PT Long Term Goals - 02/06/21 1355      PT LONG TERM  GOAL #1   Title Pt will improve BERG by at least 3 points in order to demonstrate clinically significant improvement in balance.    Baseline 09/20/20: 32/56 (Previous discharge: 03/06/20: 46/56), 12/14: 32/56, 1/13: 31/56, 01/16/21 35/56, 02/06/2021=36/56    Time 4    Period Weeks    Status On-going      PT LONG TERM GOAL #2   Title Pt will decrease TUG to below 14 seconds/decrease in order to demonstrate decreased fall risk.    Baseline 09/20/20: 19.2s, 12/14: 17.48, 1/13: 16.65 sec.  01/02/2021=15.75 sec using 4WW, 01/21/21  14.37 seconds. 02/06/2021= 14.92 sec using 4WW    Time 4    Period Weeks    Status On-going      PT LONG TERM GOAL #3   Title Pt will decrease 5TSTS by at least 3 seconds in order to demonstrate clinically significant improvement in LE strength.    Baseline 09/20/20: 23.1s with minA+1 for strength and balance (Previous discharge 03/06/20: 16.1s), 12/14: 20 sec, 1/13: 22 sec.   01/02/2021= 16.05 sec with light UE support. 01/21/21 01/21/21 37.90 seconds without UE, with UE support 12.93 seconds: 02/06/2021= with 1 UE 17.93 sec.    Time 4    Period Weeks    Status On-going      PT LONG TERM GOAL #4   Title Pt will increase his self-selected 10MWT to >0.74 m/s in order to demonstrate improvement in gait speed and improved community ambulation    Baseline 09/20/20: self-selected: 14.4s = 0.69 m/s with rollator (Previous discharge 03/06/20: self-selected: 13.6s = 0.74 m/s), 12/14: 0.75 m/s, 1/13: 0.78 m/s. 01/02/2021= 0.85 m/s using 4WW, 01/16/21 with 4 WW .99 m/s. 02/06/2021= self selected = 0.93 m/s with 7QB    Time 4    Period Weeks    Status Achieved      PT LONG TERM GOAL #5   Title Patient will increase six minute walk test distance to >1000 for progression to community ambulator and improve gait ability    Baseline 01/10/20  620 ft with rollator, 01/21/21  965 ft; 02/06/2021= 910 feet with 4WW.    Status Partially Met                 Plan - 02/11/21 1359    Clinical  Impression Statement Patient challenged with balance activities today with increased unsteadiness particularily with eyes closed- losing balance posteriorly requiring min physical assist to keep from falling. Pt will benefit from further skilled PT to improve BLE strength, balance and functional mobility    Personal Factors and Comorbidities Age;Comorbidity 3+    Comorbidities HTN, CVA, cholecystitis with cholecystostomy drain    Examination-Activity Limitations Bend;Lift;Squat;Locomotion Level;Stairs;Stand    Examination-Participation Restrictions Community Activity;Driving;Laundry;Meal Prep;Shop    Stability/Clinical Decision Making Unstable/Unpredictable    Rehab Potential Fair    PT Frequency 2x / week    PT Duration 4 weeks    PT Treatment/Interventions ADLs/Self Care Home Management;Canalith Repostioning;Cryotherapy;Electrical Stimulation;Iontophoresis 43m/ml Dexamethasone;Moist Heat;Traction;Ultrasound;DME Instruction;Gait training;Stair training;Therapeutic activities;Therapeutic exercise;Functional mobility training;Balance training;Neuromuscular re-education;Patient/family education;Manual techniques;Dry needling;Vestibular;Joint Manipulations;Spinal Manipulations;Aquatic Therapy    PT Next Visit Plan Continue to focus on LE strengthening and balance, dynamic balance tasks    PT Home Exercise Plan advance as tolerated;    Consulted and Agree with Plan of Care Patient    Family Member Consulted granddaughter           Patient will benefit from skilled therapeutic intervention in order to improve the following deficits and impairments:  Abnormal gait,Decreased balance,Decreased mobility,Difficulty walking,Decreased knowledge of precautions,Decreased safety awareness,Decreased activity tolerance,Decreased strength,Decreased endurance  Visit Diagnosis: Muscle weakness (generalized)  Unsteadiness on feet  Decreased strength of lower extremity     Problem List Patient Active  Problem List   Diagnosis Date Noted  . Aneurysm, aorta, thoracic ([place] 12/21/2020  . Calculus of bile duct without cholecystitis and without obstruction   . Non-small cell carcinoma of lung, right ([place] 09/07/2020  . Mass of upper lobe of right lung 07/13/2020  . Calculus of bile duct with cholecystitis without obstruction   .  Abnormal findings on imaging of biliary tract   . Acute cholecystitis 05/02/2020  . Chronic venous insufficiency 12/05/2019  . 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 CKD (chronic kidney disease), stage II   . New onset atrial fibrillation ([place])   . 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 Goals of care, counseling/discussion   . Palliative care by specialist   . DNR (do not resuscitate) discussion   . History of CVA (cerebrovascular accident) 05/22/2019  . Persistent proteinuria 07/30/2018  . 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 Chronic kidney disease, stage III (moderate) ([place]) 06/15/2018  . Leg pain 05/06/2018  . Lymphedema 05/06/2018  . Coagulopathy ([place]) 07/07/2017  . Lumbar spondylosis 03/10/2017  . Elevated uric acid in blood 11/20/2016  . BPH (benign prostatic hyperplasia) 05/29/2016  . Peripheral vascular disease of lower extremity ([place]) 03/03/2016  . Gallstone 12/31/2015  . 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 Atherosclerosis of aorta ([place]) 11/22/2015  . Carotid artery narrowing 11/22/2015  . Diverticulosis of colon 11/22/2015  . Decreased creatinine clearance 11/22/2015  . Arthritis, degenerative 11/22/2015  . Lactose intolerance 11/22/2015  . Basal cell carcinoma 11/22/2015  . Essential hypertension 06/21/2015  . Hyperlipemia 06/21/2015  . GERD (gastroesophageal reflux disease) 06/21/2015  . Calculus of kidney 11/18/2013    Lewis Moccasin, PT 02/12/2021, 9:57 AM  [place] MAIN [facility] SERVICES [address]  [place], [place], [address] Phone: [phone]   Fax:  [phone]  Name: Jose Castro MRN: [id number] Date of Birth: 08-17-1926

## 2021-02-13 ENCOUNTER — Other Ambulatory Visit: Payer: Self-pay

## 2021-02-13 ENCOUNTER — Ambulatory Visit: Payer: Medicare Other

## 2021-02-13 DIAGNOSIS — M6281 Muscle weakness (generalized): Secondary | ICD-10-CM | POA: Diagnosis not present

## 2021-02-13 DIAGNOSIS — R29898 Other symptoms and signs involving the musculoskeletal system: Secondary | ICD-10-CM

## 2021-02-13 DIAGNOSIS — R2689 Other abnormalities of gait and mobility: Secondary | ICD-10-CM

## 2021-02-13 DIAGNOSIS — R2681 Unsteadiness on feet: Secondary | ICD-10-CM

## 2021-02-13 NOTE — Therapy (Signed)
[place] MAIN [facility] SERVICES [address] [place], [place], [address] Phone: [phone]   Fax:  [phone]  Physical Therapy Treatment  Patient Details  Name: Jose Castro MRN: [id number] Date of Birth: 07/06/1926 Referring Provider (PT): Dr. Ancil Boozer   Encounter Date: 02/13/2021   PT End of Session - 02/13/21 1729    Visit Number 32    Number of Visits 33    Date for PT Re-Evaluation 02/18/21    Authorization Type eval: 09/20/20, Progress note 01/02/2021, PN on 02/06/2021    PT Start Time 1345    PT Stop Time 1430    PT Time Calculation (min) 45 min    Equipment Utilized During Treatment Gait belt    Activity Tolerance Patient tolerated treatment well    Behavior During Therapy WFL for tasks assessed/performed           Past Medical History:  Diagnosis Date   Allergy    Penicillin   Arthritis    Basal cell carcinoma    BPH (benign prostatic hyperplasia)    Chronic kidney disease    had a kidney stone which per family was a cyst that was removed   Dysrhythmia    GERD (gastroesophageal reflux disease)    History of kidney stones    Hyperlipidemia    Hypertension    Left-sided low back pain with left-sided sciatica    Lung mass    Splenic vein thrombosis    Stroke ([place]) 05/22/2019   Thrombosis 12/2015   mural  area and no notation of heart attack    Past Surgical History:  Procedure Laterality Date   APPENDECTOMY     CATARACT EXTRACTION, BILATERAL     CYSTOSCOPY WITH INSERTION OF UROLIFT     ENDOSCOPIC RETROGRADE CHOLANGIOPANCREATOGRAPHY (ERCP) WITH PROPOFOL N/A 05/11/2020   Procedure: ENDOSCOPIC RETROGRADE CHOLANGIOPANCREATOGRAPHY (ERCP) WITH PROPOFOL;  Surgeon: [name], MD;  Location: [facility] ENDOSCOPY;  Service: Endoscopy;  Laterality: N/A;   ERCP N/A 10/23/2020   Procedure: ENDOSCOPIC RETROGRADE CHOLANGIOPANCREATOGRAPHY (ERCP);  Surgeon: [name], MD;  Location: [facility] ENDOSCOPY;  Service:  Endoscopy;  Laterality: N/A;   EYE SURGERY  08/2018   IR EXCHANGE BILIARY DRAIN  05/04/2020   KIDNEY STONE SURGERY     KNEE SURGERY     PACEMAKER INSERTION N/A 07/13/2018   Procedure: INSERTION PACEMAKER-DUEL CHAMBER INITIAL IMPLANT;  Surgeon: Isaias Cowman, MD;  Location: [facility] ORS;  Service: Cardiovascular;  Laterality: N/A;   SPINE SURGERY      There were no vitals filed for this visit.   Subjective Assessment - 02/13/21 1726    Subjective Patient reports doing well today - states he would like to continue his PT services to continue to work on his balance as he has a wedding in the fall that he wants to be able to walk with his grandaughter if able.    Patient is accompained by: Family member    Pertinent History Pt referred for deconditioning and gait instability. He has had a decline since he last finished therapy. Pt had a hospital admission from 05/02/20 to 05/14/20 for acute cholecystitis and is now s/p cholecystostomy drain placement. Drain is still in place and pt is waiting to hear when they plan to remove it. Pt discharged to SNF and then returned home. In addition pt underwent radiation therapy for a RUL lung mass/cancer and had his last radiation treatment in September of 2021. He reports that he has continued to walk at home as  his primary exercise. Pt was previously seen at this clinic for PT/OT due to weakness/imbalance s/p CVA. Prior history from 09/21/19: Pt is a 87 year old male who presents with imbalance and difficulty with gait following a right frontal CVA with left hemiparesis on 05/22/19.  He has completed inpatient and home health PT, and now presents for OP PT.  He was discharged from inpatient rehab at a supervision level of assistance, ambulating with a RW.  His family notes that he is impulsive and will try to ambulate without his RW at home.  Family is currently providing 24/7 supervision.    Limitations House hold activities;Walking    How long can you sit  comfortably? no limitations    How long can you walk comfortably? he requires a RW    Patient Stated Goals Pt wants to be able to walk his granddaughter down the aisle at her wedding November 2022    Currently in Pain? Yes    Pain Score 4     Pain Location Knee    Pain Orientation Left    Pain Descriptors / Indicators Aching    Pain Onset More than a month ago             Neuromuscular re-ed:  Static stand with feet apart on airex blue pad with eyes open then closed x 20 sec x 3 trials each- mild unsteadiness yet no loss of balance. Static stand with feet together with eyes open then closed x 20 sec x 3 trials each with mild increase in unsteadiness yet no loss of balance with eyes open yet unable to recover with posterior lean- with eyes closed requiring multiple intermittent assist to maintain upright posture.    Sit to stand from chair without UE support x 5 reps (increased time and CGA for safety)  Step up at steps without UE support x 10 reps Step forward/backward (toe off to heelstrike) x 20 reps each leg- placing 2 cones (1 in front and 1 behind) for feedback. Patient initially with increased difficulty performing heel strike yet did improve with practice.  Side step over orange hurdle x 20 reps left to right then back without UE support (mild unsteadiness -occasional 1UE reach) VC for technique to clear hurdle.   Gait- Patient ambulated approx 300 feet with 4WW- exhibiting initial shuffling- improved with VC and distance- able to transition to more short reciprocal steps with gait speed = 0.9 m/s and no significant unsteadiness and patient did not require cues for step length after the first 50 feet.   Pt educated throughout session about proper posture and technique with exercises.I                        PT Education - 02/13/21 1728    Education Details specific strengthening and balance cues. Safety edu to reach back for armrest for safe stand to sit  transfers.    Person(s) Educated Patient    Methods Explanation;Demonstration;Tactile cues;Verbal cues    Comprehension Returned demonstration;Verbal cues required;Need further instruction;Verbalized understanding            PT Short Term Goals - 01/21/21 1438      PT SHORT TERM GOAL #1   Title Pt will be independent with HEP in order to improve strength and balance in order to decrease fall risk and improve function at home.    Baseline 01/02/2021- Patient verbalize understanding of walking and seated exercises for home program. 01/21/21 patient has been more  active and understands HEP.    Time 6    Period Weeks    Status Achieved    Target Date 01/10/21             PT Long Term Goals - 02/06/21 1355      PT LONG TERM GOAL #1   Title Pt will improve BERG by at least 3 points in order to demonstrate clinically significant improvement in balance.    Baseline 09/20/20: 32/56 (Previous discharge: 03/06/20: 46/56), 12/14: 32/56, 1/13: 31/56, 01/16/21 35/56, 02/06/2021=36/56    Time 4    Period Weeks    Status On-going      PT LONG TERM GOAL #2   Title Pt will decrease TUG to below 14 seconds/decrease in order to demonstrate decreased fall risk.    Baseline 09/20/20: 19.2s, 12/14: 17.48, 1/13: 16.65 sec. 01/02/2021=15.75 sec using 4WW, 01/21/21  14.37 seconds. 02/06/2021= 14.92 sec using 4WW    Time 4    Period Weeks    Status On-going      PT LONG TERM GOAL #3   Title Pt will decrease 5TSTS by at least 3 seconds in order to demonstrate clinically significant improvement in LE strength.    Baseline 09/20/20: 23.1s with minA+1 for strength and balance (Previous discharge 03/06/20: 16.1s), 12/14: 20 sec, 1/13: 22 sec.   01/02/2021= 16.05 sec with light UE support. 01/21/21 01/21/21 37.90 seconds without UE, with UE support 12.93 seconds: 02/06/2021= with 1 UE 17.93 sec.    Time 4    Period Weeks    Status On-going      PT LONG TERM GOAL #4   Title Pt will increase his self-selected 10MWT to  >0.74 m/s in order to demonstrate improvement in gait speed and improved community ambulation    Baseline 09/20/20: self-selected: 14.4s = 0.69 m/s with rollator (Previous discharge 03/06/20: self-selected: 13.6s = 0.74 m/s), 12/14: 0.75 m/s, 1/13: 0.78 m/s. 01/02/2021= 0.85 m/s using 4WW, 01/16/21 with 4 WW .99 m/s. 02/06/2021= self selected = 0.93 m/s with 2WL    Time 4    Period Weeks    Status Achieved      PT LONG TERM GOAL #5   Title Patient will increase six minute walk test distance to >1000 for progression to community ambulator and improve gait ability    Baseline 01/10/20  620 ft with rollator, 01/21/21  965 ft; 02/06/2021= 910 feet with 4WW.    Status Partially Met                 Plan - 02/13/21 1730    Clinical Impression Statement Patient demonstrated difficulty with shuffling gait into clinic and improved with cues and breakdown focusing on heelstrike to toe off with improved overall gait. Patient continues to exhibit unsteadiness requiring VC and occasion 1 UE support on bar for safety with balance activities. Pt will benefit from further skilled PT to improve BLE strength, balance and functional mobility    Personal Factors and Comorbidities Age;Comorbidity 3+    Comorbidities HTN, CVA, cholecystitis with cholecystostomy drain    Examination-Activity Limitations Bend;Lift;Squat;Locomotion Level;Stairs;Stand    Examination-Participation Restrictions Community Activity;Driving;Laundry;Meal Prep;Shop    Stability/Clinical Decision Making Unstable/Unpredictable    Rehab Potential Fair    PT Frequency 2x / week    PT Duration 4 weeks    PT Treatment/Interventions ADLs/Self Care Home Management;Canalith Repostioning;Cryotherapy;Electrical Stimulation;Iontophoresis 30m/ml Dexamethasone;Moist Heat;Traction;Ultrasound;DME Instruction;Gait training;Stair training;Therapeutic activities;Therapeutic exercise;Functional mobility training;Balance training;Neuromuscular  re-education;Patient/family education;Manual techniques;Dry needling;Vestibular;Joint Manipulations;Spinal Manipulations;Aquatic Therapy  PT Next Visit Plan Continue to focus on LE strengthening and balance, dynamic balance tasks    PT Home Exercise Plan advance as tolerated;    Consulted and Agree with Plan of Care Patient    Family Member Consulted granddaughter           Patient will benefit from skilled therapeutic intervention in order to improve the following deficits and impairments:  Abnormal gait,Decreased balance,Decreased mobility,Difficulty walking,Decreased knowledge of precautions,Decreased safety awareness,Decreased activity tolerance,Decreased strength,Decreased endurance  Visit Diagnosis: Muscle weakness (generalized)  Unsteadiness on feet  Decreased strength of lower extremity  Other abnormalities of gait and mobility     Problem List Patient Active Problem List   Diagnosis Date Noted   Aneurysm, aorta, thoracic ([place]) 12/21/2020   Calculus of bile duct without cholecystitis and without obstruction    Non-small cell carcinoma of lung, right ([place]) 09/07/2020   Mass of upper lobe of right lung 07/13/2020   Calculus of bile duct with cholecystitis without obstruction    Abnormal findings on imaging of biliary tract    Acute cholecystitis 05/02/2020   Chronic venous insufficiency 12/05/2019   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    CKD (chronic kidney disease), stage II    New onset atrial fibrillation ([place])    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   Goals of care, counseling/discussion    Palliative care by specialist    DNR (do not resuscitate) discussion    History of CVA (cerebrovascular accident) 05/22/2019   Persistent proteinuria 07/30/2018   Mobitz type 2 second degree heart block 07/13/2018   Chronic kidney disease, stage III (moderate) ([place]) 06/15/2018   Leg pain 05/06/2018   Lymphedema 05/06/2018    Coagulopathy ([place]) 07/07/2017   Lumbar spondylosis 03/10/2017   Elevated uric acid in blood 11/20/2016   BPH (benign prostatic hyperplasia) 05/29/2016   Peripheral vascular disease of lower extremity ([place]) 03/03/2016   Gallstone 12/31/2015   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   Atherosclerosis of aorta ([place]) 11/22/2015   Carotid artery narrowing 11/22/2015   Diverticulosis of colon 11/22/2015   Decreased creatinine clearance 11/22/2015   Arthritis, degenerative 11/22/2015   Lactose intolerance 11/22/2015   Basal cell carcinoma 11/22/2015   Essential hypertension 06/21/2015   Hyperlipemia 06/21/2015   GERD (gastroesophageal reflux disease) 06/21/2015   Calculus of kidney 11/18/2013    Lewis Moccasin, PT 02/14/2021, 9:54 AM  [place] MAIN [facility] SERVICES [address] [place], [place], [address] Phone: [phone]   Fax:  [phone]  Name: JUVENTINO PAVONE MRN: [id number] Date of Birth: 03-May-1926  [place] [address] [place], [place], [address] Phone: [phone]   Fax:  [phone]  Physical Therapy Treatment  Patient Details  Name: Jose Castro MRN: [id number] Date of Birth: 1926-07-24 Referring Provider (PT): Dr. Ancil Boozer   Encounter Date: 02/13/2021   PT End of Session - 02/13/21 1729    Visit Number 32    Number of Visits 33    Date for PT Re-Evaluation 02/18/21    Authorization Type eval: 09/20/20, Progress note 01/02/2021, PN on 02/06/2021    PT Start Time 1345    PT Stop Time 1430    PT Time Calculation (min) 45 min    Equipment Utilized During Treatment Gait belt    Activity Tolerance Patient tolerated treatment well    Behavior During Therapy [facility] for tasks assessed/performed  Past Medical History:  Diagnosis Date   Allergy    Penicillin   Arthritis    Basal cell carcinoma    BPH  (benign prostatic hyperplasia)    Chronic kidney disease    had a kidney stone which per family was a cyst that was removed   Dysrhythmia    GERD (gastroesophageal reflux disease)    History of kidney stones    Hyperlipidemia    Hypertension    Left-sided low back pain with left-sided sciatica    Lung mass    Splenic vein thrombosis    Stroke ([place]) 05/22/2019   Thrombosis 12/2015   mural  area and no notation of heart attack    Past Surgical History:  Procedure Laterality Date   APPENDECTOMY     CATARACT EXTRACTION, BILATERAL     CYSTOSCOPY WITH INSERTION OF UROLIFT     ENDOSCOPIC RETROGRADE CHOLANGIOPANCREATOGRAPHY (ERCP) WITH PROPOFOL N/A 05/11/2020   Procedure: ENDOSCOPIC RETROGRADE CHOLANGIOPANCREATOGRAPHY (ERCP) WITH PROPOFOL;  Surgeon: [name], MD;  Location: [facility] ENDOSCOPY;  Service: Endoscopy;  Laterality: N/A;   ERCP N/A 10/23/2020   Procedure: ENDOSCOPIC RETROGRADE CHOLANGIOPANCREATOGRAPHY (ERCP);  Surgeon: [name], MD;  Location: [facility] ENDOSCOPY;  Service: Endoscopy;  Laterality: N/A;   EYE SURGERY  08/2018   IR EXCHANGE BILIARY DRAIN  05/04/2020   KIDNEY STONE SURGERY     KNEE SURGERY     PACEMAKER INSERTION N/A 07/13/2018   Procedure: INSERTION PACEMAKER-DUEL CHAMBER INITIAL IMPLANT;  Surgeon: Isaias Cowman, MD;  Location: [facility] ORS;  Service: Cardiovascular;  Laterality: N/A;   SPINE SURGERY      There were no vitals filed for this visit.   Step up forward with B rails x 5 steps each Step up forward with 1 UE x steps BLE  N  Subjective Assessment - 02/13/21 1726    Subjective Patient reports doing well today - states he would like to continue his PT services to continue to work on his balance as he has a wedding in the fall that he wants to be able to walk with his grandaughter if able.    Patient is accompained by: Family member    Pertinent History Pt referred for deconditioning and gait instability. He has had a decline since  he last finished therapy. Pt had a hospital admission from 05/02/20 to 05/14/20 for acute cholecystitis and is now s/p cholecystostomy drain placement. Drain is still in place and pt is waiting to hear when they plan to remove it. Pt discharged to SNF and then returned home. In addition pt underwent radiation therapy for a RUL lung mass/cancer and had his last radiation treatment in September of 2021. He reports that he has continued to walk at home as his primary exercise. Pt was previously seen at this clinic for PT/OT due to weakness/imbalance s/p CVA. Prior history from 09/21/19: Pt is a 87 year old male who presents with imbalance and difficulty with gait following a right frontal CVA with left hemiparesis on 05/22/19.  He has completed inpatient and home health PT, and now presents for OP PT.  He was discharged from inpatient rehab at a supervision level of assistance, ambulating with a RW.  His family notes that he is impulsive and will try to ambulate without his RW at home.  Family is currently providing 24/7 supervision.    Limitations House hold activities;Walking    How long can you sit comfortably? no limitations    How long can you walk comfortably? he requires a RW  Patient Stated Goals Pt wants to be able to walk his granddaughter down the aisle at her wedding November 2022    Currently in Pain? Yes    Pain Score 4     Pain Location Knee    Pain Orientation Left    Pain Descriptors / Indicators Aching    Pain Onset More than a month ago                                     PT Education - 02/13/21 1728    Education Details specific strengthening and balance cues. Safety edu to reach back for armrest for safe stand to sit transfers.    Person(s) Educated Patient    Methods Explanation;Demonstration;Tactile cues;Verbal cues    Comprehension Returned demonstration;Verbal cues required;Need further instruction;Verbalized understanding            PT Short  Term Goals - 01/21/21 1438      PT SHORT TERM GOAL #1   Title Pt will be independent with HEP in order to improve strength and balance in order to decrease fall risk and improve function at home.    Baseline 01/02/2021- Patient verbalize understanding of walking and seated exercises for home program. 01/21/21 patient has been more active and understands HEP.    Time 6    Period Weeks    Status Achieved    Target Date 01/10/21             PT Long Term Goals - 02/06/21 1355      PT LONG TERM GOAL #1   Title Pt will improve BERG by at least 3 points in order to demonstrate clinically significant improvement in balance.    Baseline 09/20/20: 32/56 (Previous discharge: 03/06/20: 46/56), 12/14: 32/56, 1/13: 31/56, 01/16/21 35/56, 02/06/2021=36/56    Time 4    Period Weeks    Status On-going      PT LONG TERM GOAL #2   Title Pt will decrease TUG to below 14 seconds/decrease in order to demonstrate decreased fall risk.    Baseline 09/20/20: 19.2s, 12/14: 17.48, 1/13: 16.65 sec. 01/02/2021=15.75 sec using 4WW, 01/21/21  14.37 seconds. 02/06/2021= 14.92 sec using 4WW    Time 4    Period Weeks    Status On-going      PT LONG TERM GOAL #3   Title Pt will decrease 5TSTS by at least 3 seconds in order to demonstrate clinically significant improvement in LE strength.    Baseline 09/20/20: 23.1s with minA+1 for strength and balance (Previous discharge 03/06/20: 16.1s), 12/14: 20 sec, 1/13: 22 sec.   01/02/2021= 16.05 sec with light UE support. 01/21/21 01/21/21 37.90 seconds without UE, with UE support 12.93 seconds: 02/06/2021= with 1 UE 17.93 sec.    Time 4    Period Weeks    Status On-going      PT LONG TERM GOAL #4   Title Pt will increase his self-selected 10MWT to >0.74 m/s in order to demonstrate improvement in gait speed and improved community ambulation    Baseline 09/20/20: self-selected: 14.4s = 0.69 m/s with rollator (Previous discharge 03/06/20: self-selected: 13.6s = 0.74 m/s), 12/14: 0.75 m/s,  1/13: 0.78 m/s. 01/02/2021= 0.85 m/s using 4WW, 01/16/21 with 4 WW .99 m/s. 02/06/2021= self selected = 0.93 m/s with 4WW    Time 4    Period Weeks    Status Achieved      PT LONG  TERM GOAL #5   Title Patient will increase six minute walk test distance to >1000 for progression to community ambulator and improve gait ability    Baseline 01/10/20  620 ft with rollator, 01/21/21  965 ft; 02/06/2021= 910 feet with 4WW.    Status Partially Met                 Plan - 02/13/21 1730    Clinical Impression Statement Patient demonstrated difficulty with shuffling gait into clinic and improved with cues and breakdown focusing on heelstrike to toe off with improved overall gait. Patient continues to exhibit unsteadiness requiring VC and occasion 1 UE support on bar for safety with balance activities. Pt will benefit from further skilled PT to improve BLE strength, balance and functional mobility    Personal Factors and Comorbidities Age;Comorbidity 3+    Comorbidities HTN, CVA, cholecystitis with cholecystostomy drain    Examination-Activity Limitations Bend;Lift;Squat;Locomotion Level;Stairs;Stand    Examination-Participation Restrictions Community Activity;Driving;Laundry;Meal Prep;Shop    Stability/Clinical Decision Making Unstable/Unpredictable    Rehab Potential Fair    PT Frequency 2x / week    PT Duration 4 weeks    PT Treatment/Interventions ADLs/Self Care Home Management;Canalith Repostioning;Cryotherapy;Electrical Stimulation;Iontophoresis 67m/ml Dexamethasone;Moist Heat;Traction;Ultrasound;DME Instruction;Gait training;Stair training;Therapeutic activities;Therapeutic exercise;Functional mobility training;Balance training;Neuromuscular re-education;Patient/family education;Manual techniques;Dry needling;Vestibular;Joint Manipulations;Spinal Manipulations;Aquatic Therapy    PT Next Visit Plan Continue to focus on LE strengthening and balance, dynamic balance tasks    PT Home Exercise Plan  advance as tolerated;    Consulted and Agree with Plan of Care Patient    Family Member Consulted granddaughter           Patient will benefit from skilled therapeutic intervention in order to improve the following deficits and impairments:  Abnormal gait,Decreased balance,Decreased mobility,Difficulty walking,Decreased knowledge of precautions,Decreased safety awareness,Decreased activity tolerance,Decreased strength,Decreased endurance  Visit Diagnosis: Muscle weakness (generalized)  Unsteadiness on feet  Decreased strength of lower extremity  Other abnormalities of gait and mobility     Problem List Patient Active Problem List   Diagnosis Date Noted   Aneurysm, aorta, thoracic ([place] 12/21/2020   Calculus of bile duct without cholecystitis and without obstruction    Non-small cell carcinoma of lung, right ([place] 09/07/2020   Mass of upper lobe of right lung 07/13/2020   Calculus of bile duct with cholecystitis without obstruction    Abnormal findings on imaging of biliary tract    Acute cholecystitis 05/02/2020   Chronic venous insufficiency 12/05/2019   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    CKD (chronic kidney disease), stage II    New onset atrial fibrillation ([place]    Right middle cerebral artery stroke ([place] 05/27/2019   Goals of care, counseling/discussion    Palliative care by specialist    DNR (do not resuscitate) discussion    History of CVA (cerebrovascular accident) 05/22/2019   Persistent proteinuria 07/30/2018   Mobitz type 2 second degree heart block 07/13/2018   Chronic kidney disease, stage III (moderate) ([place] 06/15/2018   Leg pain 05/06/2018   Lymphedema 05/06/2018   Coagulopathy ([place] 07/07/2017   Lumbar spondylosis 03/10/2017   Elevated uric acid in blood 11/20/2016   BPH (benign prostatic hyperplasia) 05/29/2016   Peripheral vascular disease of lower extremity ([place] 03/03/2016   Gallstone 12/31/2015    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   Atherosclerosis of aorta ([place] 11/22/2015   Carotid artery narrowing 11/22/2015   Diverticulosis of colon 11/22/2015   Decreased creatinine clearance 11/22/2015  Arthritis, degenerative 11/22/2015   Lactose intolerance 11/22/2015   Basal cell carcinoma 11/22/2015   Essential hypertension 06/21/2015   Hyperlipemia 06/21/2015   GERD (gastroesophageal reflux disease) 06/21/2015   Calculus of kidney 11/18/2013    Lewis Moccasin, PT 02/14/2021, 9:54 AM  [place] MAIN [facility] SERVICES [address] [place], [place], [address] Phone: [phone]   Fax:  [phone]  Name: DESHAN HEMMELGARN MRN: [id number] Date of Birth: 1926/01/26

## 2021-02-18 ENCOUNTER — Ambulatory Visit: Payer: Medicare Other

## 2021-02-18 ENCOUNTER — Other Ambulatory Visit: Payer: Self-pay

## 2021-02-18 DIAGNOSIS — R278 Other lack of coordination: Secondary | ICD-10-CM

## 2021-02-18 DIAGNOSIS — R2681 Unsteadiness on feet: Secondary | ICD-10-CM

## 2021-02-18 DIAGNOSIS — R2689 Other abnormalities of gait and mobility: Secondary | ICD-10-CM

## 2021-02-18 DIAGNOSIS — M6281 Muscle weakness (generalized): Secondary | ICD-10-CM | POA: Diagnosis not present

## 2021-02-18 DIAGNOSIS — R29898 Other symptoms and signs involving the musculoskeletal system: Secondary | ICD-10-CM

## 2021-02-18 NOTE — Therapy (Signed)
[place] MAIN [facility] SERVICES [address] [place], [place], [address] Phone: [phone]   Fax:  [phone]  Physical Therapy Treatment/ Recertification for dates: 02/18/2021 through 05/13/2021  Patient Details  Name: Jose Castro MRN: [id number] Date of Birth: 20-Jan-1926 Referring Provider (PT): Dr. Ancil Boozer   Encounter Date: 02/18/2021   PT End of Session - 02/18/21 1720    Visit Number 33    Number of Visits 52    Date for PT Re-Evaluation 05/13/21    Authorization Type eval: 09/20/20, Progress note 01/02/2021, PN on 12/06/1759, Recert = 05/07/3709-05/26/9484    PT Start Time 1353    PT Stop Time 1430    PT Time Calculation (min) 37 min    Equipment Utilized During Treatment Gait belt    Activity Tolerance Patient tolerated treatment well    Behavior During Therapy WFL for tasks assessed/performed           Past Medical History:  Diagnosis Date   Allergy    Penicillin   Arthritis    Basal cell carcinoma    BPH (benign prostatic hyperplasia)    Chronic kidney disease    had a kidney stone which per family was a cyst that was removed   Dysrhythmia    GERD (gastroesophageal reflux disease)    History of kidney stones    Hyperlipidemia    Hypertension    Left-sided low back pain with left-sided sciatica    Lung mass    Splenic vein thrombosis    Stroke ([place]) 05/22/2019   Thrombosis 12/2015   mural  area and no notation of heart attack    Past Surgical History:  Procedure Laterality Date   APPENDECTOMY     CATARACT EXTRACTION, BILATERAL     CYSTOSCOPY WITH INSERTION OF UROLIFT     ENDOSCOPIC RETROGRADE CHOLANGIOPANCREATOGRAPHY (ERCP) WITH PROPOFOL N/A 05/11/2020   Procedure: ENDOSCOPIC RETROGRADE CHOLANGIOPANCREATOGRAPHY (ERCP) WITH PROPOFOL;  Surgeon: [name], MD;  Location: [facility] ENDOSCOPY;  Service: Endoscopy;  Laterality: N/A;   ERCP N/A 10/23/2020   Procedure: ENDOSCOPIC RETROGRADE  CHOLANGIOPANCREATOGRAPHY (ERCP);  Surgeon: [name], MD;  Location: [facility] ENDOSCOPY;  Service: Endoscopy;  Laterality: N/A;   EYE SURGERY  08/2018   IR EXCHANGE BILIARY DRAIN  05/04/2020   KIDNEY STONE SURGERY     KNEE SURGERY     PACEMAKER INSERTION N/A 07/13/2018   Procedure: INSERTION PACEMAKER-DUEL CHAMBER INITIAL IMPLANT;  Surgeon: Isaias Cowman, MD;  Location: [facility] ORS;  Service: Cardiovascular;  Laterality: N/A;   SPINE SURGERY      There were no vitals filed for this visit.   Subjective Assessment - 02/18/21 1356    Subjective Patient reports able to get out this weekend and attend his great grandchild birthday.    Patient is accompained by: Family member    Pertinent History Pt referred for deconditioning and gait instability. He has had a decline since he last finished therapy. Pt had a hospital admission from 05/02/20 to 05/14/20 for acute cholecystitis and is now s/p cholecystostomy drain placement. Drain is still in place and pt is waiting to hear when they plan to remove it. Pt discharged to SNF and then returned home. In addition pt underwent radiation therapy for a RUL lung mass/cancer and had his last radiation treatment in September of 2021. He reports that he has continued to walk at home as his primary exercise. Pt was previously seen at this clinic for PT/OT due to weakness/imbalance s/p CVA. 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  He has completed inpatient and home health PT, and now presents for OP PT.  He was discharged from inpatient rehab at a supervision level of assistance, ambulating with a RW.  His family notes that he is impulsive and will try to ambulate without his RW at home.  Family is currently providing 24/7 supervision.    Limitations House hold activities;Walking    How long can you sit comfortably? no limitations    How  long can you walk comfortably? he requires a RW    Patient Stated Goals Pt wants to be able to walk his granddaughter down the aisle at her wedding November 2022    Currently in Pain? Yes    Pain Location Knee    Pain Descriptors / Indicators Aching    Pain Type Chronic pain    Pain Onset More than a month ago    Pain Frequency Intermittent              [facility] PT Assessment - 02/18/21 1408      Berg Balance Test   Sit to Stand Able to stand  independently using hands    Standing Unsupported Able to stand safely 2 minutes    Sitting with Back Unsupported but Feet Supported on Floor or Stool Able to sit safely and securely 2 minutes    Stand to Sit Sits safely with minimal use of hands    Transfers Able to transfer safely, definite need of hands    Standing Unsupported with Eyes Closed Able to stand 10 seconds with supervision    Standing Unsupported with Feet Together Able to place feet together independently and stand for 1 minute with supervision    From Standing, Reach Forward with Outstretched Arm Can reach forward >12 cm safely (5")    From Standing Position, Pick up Object from Floor Able to pick up shoe, needs supervision    From Standing Position, Turn to Look Behind Over each Shoulder Turn sideways only but maintains balance    Turn 360 Degrees Needs close supervision or verbal cueing    Standing Unsupported, Alternately Place Feet on Step/Stool Able to complete >2 steps/needs minimal assist    Standing Unsupported, One Foot in Front Able to take small step independently and hold 30 seconds    Standing on One Leg Tries to lift leg/unable to hold 3 seconds but remains standing independently    Total Score 37            Recert Visit: Reassess all goals- (See goal section for details).  Patient performed BERG, TUG, 5x STS, and 6 min walk test- improved in all test today.   Clinical Impression: Patient presents today with good progress during most recent certification. He  has demonstrated improved balance including scoring higher with BERG balance test. He is still at risk for falling and encouraged to continue to use his walker for all mobility. He also improved with functional strength as seen by decreased overall time with 5x Sit to stand and now has progressed to performing without UE support. He continues to progress with Timed up and go test and denies any falls. He has potential to continue to progress with continued PT services and will be recertified at 2x/week for another 12 weeks based on progression and need to continue as max potential has yet to be achieved. Patient  in agreement with plan at this time.                      PT Education - 02/18/21 1718    Education Details Specific outcome/test measure- importance to progress. Education in recertification process.    Person(s) Educated Patient    Methods Explanation;Demonstration;Verbal cues    Comprehension Verbalized understanding;Returned demonstration;Verbal cues required;Need further instruction            PT Short Term Goals - 01/21/21 1438      PT SHORT TERM GOAL #1   Title Pt will be independent with HEP in order to improve strength and balance in order to decrease fall risk and improve function at home.    Baseline 01/02/2021- Patient verbalize understanding of walking and seated exercises for home program. 01/21/21 patient has been more active and understands HEP.    Time 6    Period Weeks    Status Achieved    Target Date 01/10/21             PT Long Term Goals - 02/18/21 1403      PT LONG TERM GOAL #1   Title Pt will improve BERG by at least 3 points in order to demonstrate clinically significant improvement in balance.    Baseline 09/20/20: 32/56 (Previous discharge: 03/06/20: 46/56), 12/14: 32/56, 1/13: 31/56, 01/16/21 35/56, 02/06/2021=36/56. 02/18/2021= 37/56    Time 12    Period Weeks    Status On-going    Target Date 05/13/21      PT LONG TERM GOAL #2    Title Pt will decrease TUG to below 14 seconds/decrease in order to demonstrate decreased fall risk.    Baseline 09/20/20: 19.2s, 12/14: 17.48, 1/13: 16.65 sec. 01/02/2021=15.75 sec using 4WW, 01/21/21  14.37 seconds. 02/06/2021= 14.92 sec using 4WW. 02/18/2021= 14.5 sec using 4WW.    Time 12    Period Weeks    Status On-going    Target Date 05/13/21      PT LONG TERM GOAL #3   Title Pt will decrease 5TSTS by at least 3 seconds in order to demonstrate clinically significant improvement in LE strength.    Baseline 09/20/20: 23.1s with minA+1 for strength and balance (Previous discharge 03/06/20: 16.1s), 12/14: 20 sec, 1/13: 22 sec.   01/02/2021= 16.05 sec with light UE support. 01/21/21 01/21/21 37.90 seconds without UE, with UE support 12.93 seconds: 02/06/2021= with 1 UE 17.93 sec. 02/18/2021= 21.33 sec without UE support.    Time 12    Period Weeks    Status On-going    Target Date 05/13/21      PT LONG TERM GOAL #4   Title Pt will increase his self-selected 10MWT to >0.74 m/s in order to demonstrate improvement in gait speed and improved community ambulation    Baseline 09/20/20: self-selected: 14.4s = 0.69 m/s with rollator (Previous discharge 03/06/20: self-selected: 13.6s = 0.74 m/s), 12/14: 0.75 m/s, 1/13: 0.78 m/s. 01/02/2021= 0.85 m/s using 4WW, 01/16/21 with 4 WW .99 m/s. 02/06/2021= self selected = 0.93 m/s with 4UJ    Time 4    Period Weeks    Status Achieved      PT LONG TERM GOAL #5   Title Patient will increase six minute walk test distance to >1000 for progression to community ambulator and improve gait ability    Baseline 01/10/20  620 ft with rollator, 01/21/21  965 ft; 02/06/2021= 910 feet with 4WW.  02/18/2021= 950 feet with 8JX  Time 12    Period Weeks    Status On-going    Target Date 05/13/21                 Plan - 02/18/21 1721    Clinical Impression Statement Patient presents today with good progress during most recent certification. He has demonstrated improved balance  including scoring higher with BERG balance test. He is still at risk for falling and encouraged to continue to use his walker for all mobility. He also improved with functional strength as seen by decreased overall time with 5x Sit to stand and now has progressed to performing without UE support. He continues to progress with Timed up and go test and denies any falls. He has potential to continue to progress with continued PT services and will be recertified at 2x/week for another 12 weeks based on progression and need to continue as max potential has yet to be achieved. Patient in agreement with plan at this time.    Personal Factors and Comorbidities Age;Comorbidity 3+    Comorbidities HTN, CVA, cholecystitis with cholecystostomy drain    Examination-Activity Limitations Bend;Lift;Squat;Locomotion Level;Stairs;Stand    Examination-Participation Restrictions Community Activity;Driving;Laundry;Meal Prep;Shop    Stability/Clinical Decision Making Unstable/Unpredictable    Rehab Potential Fair    PT Frequency 2x / week    PT Duration 4 weeks    PT Treatment/Interventions ADLs/Self Care Home Management;Canalith Repostioning;Cryotherapy;Electrical Stimulation;Iontophoresis 4mg /ml Dexamethasone;Moist Heat;Traction;Ultrasound;DME Instruction;Gait training;Stair training;Therapeutic activities;Therapeutic exercise;Functional mobility training;Balance training;Neuromuscular re-education;Patient/family education;Manual techniques;Dry needling;Vestibular;Joint Manipulations;Spinal Manipulations;Aquatic Therapy    PT Next Visit Plan Continue to focus on LE strengthening and balance, dynamic balance tasks    PT Home Exercise Plan advance as tolerated;    Consulted and Agree with Plan of Care Patient    Family Member Consulted --           Patient will benefit from skilled therapeutic intervention in order to improve the following deficits and impairments:  Abnormal gait,Decreased balance,Decreased  mobility,Difficulty walking,Decreased knowledge of precautions,Decreased safety awareness,Decreased activity tolerance,Decreased strength,Decreased endurance  Visit Diagnosis: Muscle weakness (generalized)  Unsteadiness on feet  Decreased strength of lower extremity  Other abnormalities of gait and mobility  Other lack of coordination     Problem List Patient Active Problem List   Diagnosis Date Noted   Aneurysm, aorta, thoracic ([place]) 12/21/2020   Calculus of bile duct without cholecystitis and without obstruction    Non-small cell carcinoma of lung, right ([place]) 09/07/2020   Mass of upper lobe of right lung 07/13/2020   Calculus of bile duct with cholecystitis without obstruction    Abnormal findings on imaging of biliary tract    Acute cholecystitis 05/02/2020   Chronic venous insufficiency 12/05/2019   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    CKD (chronic kidney disease), stage II    New onset atrial fibrillation ([place])    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   Goals of care, counseling/discussion    Palliative care by specialist    DNR (do not resuscitate) discussion    History of CVA (cerebrovascular accident) 05/22/2019   Persistent proteinuria 07/30/2018   Mobitz type 2 second degree heart block 07/13/2018   Chronic kidney disease, stage III (moderate) ([place]) 06/15/2018   Leg pain 05/06/2018   Lymphedema 05/06/2018   Coagulopathy ([place]) 07/07/2017   Lumbar spondylosis 03/10/2017   Elevated uric acid in blood 11/20/2016   BPH (benign prostatic hyperplasia) 05/29/2016   Peripheral vascular disease of lower extremity ([place]) 03/03/2016  Gallstone 12/31/2015   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   Atherosclerosis of aorta ([place]) 11/22/2015   Carotid artery narrowing 11/22/2015   Diverticulosis of colon 11/22/2015   Decreased creatinine clearance 11/22/2015   Arthritis, degenerative  11/22/2015   Lactose intolerance 11/22/2015   Basal cell carcinoma 11/22/2015   Essential hypertension 06/21/2015   Hyperlipemia 06/21/2015   GERD (gastroesophageal reflux disease) 06/21/2015   Calculus of kidney 11/18/2013    Lewis Moccasin, PT 02/18/2021, 5:34 PM  [place] MAIN [facility] SERVICES [address] [place], [place], [address] Phone: [phone]   Fax:  [phone]  Name: Jose Castro MRN: [id number] Date of Birth: 1926-06-10

## 2021-02-20 ENCOUNTER — Ambulatory Visit: Payer: Medicare Other

## 2021-02-20 ENCOUNTER — Other Ambulatory Visit: Payer: Self-pay

## 2021-02-20 DIAGNOSIS — M6281 Muscle weakness (generalized): Secondary | ICD-10-CM

## 2021-02-20 DIAGNOSIS — R2689 Other abnormalities of gait and mobility: Secondary | ICD-10-CM

## 2021-02-20 DIAGNOSIS — R29898 Other symptoms and signs involving the musculoskeletal system: Secondary | ICD-10-CM

## 2021-02-20 DIAGNOSIS — R2681 Unsteadiness on feet: Secondary | ICD-10-CM

## 2021-02-20 NOTE — Therapy (Signed)
[place] MAIN [facility] SERVICES [address] [place], [place], [address] Phone: [phone]   Fax:  [phone]  Physical Therapy Treatment  Patient Details  Name: Jose Castro MRN: [id number] Date of Birth: 1925/12/11 Referring Provider (PT): Dr. Ancil Boozer   Encounter Date: 02/20/2021   PT End of Session - 02/20/21 1456    Visit Number 34    Number of Visits 50    Date for PT Re-Evaluation 05/13/21    Authorization Type eval: 09/20/20, Progress note 01/02/2021, PN on 0/05/3709, Recert = 05/26/9484-4/62/7035    PT Start Time 1348    PT Stop Time 1430    PT Time Calculation (min) 42 min    Equipment Utilized During Treatment Gait belt    Activity Tolerance Patient tolerated treatment well    Behavior During Therapy WFL for tasks assessed/performed           Past Medical History:  Diagnosis Date   Allergy    Penicillin   Arthritis    Basal cell carcinoma    BPH (benign prostatic hyperplasia)    Chronic kidney disease    had a kidney stone which per family was a cyst that was removed   Dysrhythmia    GERD (gastroesophageal reflux disease)    History of kidney stones    Hyperlipidemia    Hypertension    Left-sided low back pain with left-sided sciatica    Lung mass    Splenic vein thrombosis    Stroke ([place]) 05/22/2019   Thrombosis 12/2015   mural  area and no notation of heart attack    Past Surgical History:  Procedure Laterality Date   APPENDECTOMY     CATARACT EXTRACTION, BILATERAL     CYSTOSCOPY WITH INSERTION OF UROLIFT     ENDOSCOPIC RETROGRADE CHOLANGIOPANCREATOGRAPHY (ERCP) WITH PROPOFOL N/A 05/11/2020   Procedure: ENDOSCOPIC RETROGRADE CHOLANGIOPANCREATOGRAPHY (ERCP) WITH PROPOFOL;  Surgeon: [name], MD;  Location: [facility] ENDOSCOPY;  Service: Endoscopy;  Laterality: N/A;   ERCP N/A 10/23/2020   Procedure: ENDOSCOPIC RETROGRADE CHOLANGIOPANCREATOGRAPHY (ERCP);  Surgeon: [name], MD;  Location: [facility]  ENDOSCOPY;  Service: Endoscopy;  Laterality: N/A;   EYE SURGERY  08/2018   IR EXCHANGE BILIARY DRAIN  05/04/2020   KIDNEY STONE SURGERY     KNEE SURGERY     PACEMAKER INSERTION N/A 07/13/2018   Procedure: INSERTION PACEMAKER-DUEL CHAMBER INITIAL IMPLANT;  Surgeon: Isaias Cowman, MD;  Location: [facility] ORS;  Service: Cardiovascular;  Laterality: N/A;   SPINE SURGERY      There were no vitals filed for this visit.   Subjective Assessment - 02/20/21 1455    Subjective Patient reports no new complaints or issues today. Denies any falls and reports his knee is doing about the same- not bothering him upon arrival.    Patient is accompained by: Family member    Pertinent History Pt referred for deconditioning and gait instability. He has had a decline since he last finished therapy. Pt had a hospital admission from 05/02/20 to 05/14/20 for acute cholecystitis and is now s/p cholecystostomy drain placement. Drain is still in place and pt is waiting to hear when they plan to remove it. Pt discharged to SNF and then returned home. In addition pt underwent radiation therapy for a RUL lung mass/cancer and had his last radiation treatment in September of 2021. He reports that he has continued to walk at home as his primary exercise. Pt was previously seen at this clinic for PT/OT due to weakness/imbalance  s/p CVA. Prior history from 09/21/19: Pt is a 87 year old male who presents with imbalance and difficulty with gait following a right frontal CVA with left hemiparesis on 05/22/19.  He has completed inpatient and home health PT, and now presents for OP PT.  He was discharged from inpatient rehab at a supervision level of assistance, ambulating with a RW.  His family notes that he is impulsive and will try to ambulate without his RW at home.  Family is currently providing 24/7 supervision.    Limitations House hold activities;Walking    How long can you sit comfortably? no limitations    How long can you walk  comfortably? he requires a RW    Patient Stated Goals Pt wants to be able to walk his granddaughter down the aisle at her wedding November 2022    Currently in Pain? No/denies    Pain Onset More than a month ago         Neuromuscular re-ed:   360 deg turn- Patient demo shuffling stepping with unsteadiness without UE support- leaning backward with retropulsion multiple times. Attempted around 5 trials requiring VC and visual demo to take a longer step. Patient able to minimal improve yet still unsteady and increased risk of falling.   Staggered stance - hold 30 sec x 3 trials each leg- Patient with initial unsteadiness yet improved with practice today- CGA with occasional loss of balance.   Static stand with Feet apart in corner with 4WW in front and CGA- patient able to static stand with eyes open x 20 sec, followed by EC for 20 sec, Head turn x 5 times without loss of balance.  Static stand with Feet together- EO x 20 sec (mild increased sway and unsteadiness) with CGA then progressed to EC- increased sway with retropulsion and unable to self - correct.  Gait in hallway using [phone]- Focusing on forward walking with heading turning left to right x  50 feet x 4 trials and then switched to head nods x 50 feet x 4 trials. Patient exhibited mild unsteadiness and decreased gait velocity yet did improve with practice    Wall posture stretch-  Patient unable to stand erect with head touching wall due to forward head/thoracic kyphotic posture requiring max cues to chin tuck and stand erect with scapulas touching wall. Instructed patient to hold for 1 min x 3 sets at home.  Lumbar flex/ext against wall x 10 reps- Verbal cues to lean forward and then stand erect with shoulder blades back to wall.    Clinical Impression: Patient continues to be challenged with balance activities and presents with more retropulsion with eyes closed and turning 360 deg today. He was also educated in postural wall stretch  with increased VC for correct form today. He performed well with dynamic balance using his walker without significant loss of balance with head turn/nod today. Pt will benefit from further skilled PT to improve BLE strength, balance and functional mobility                         PT Education - 02/20/21 1456    Education Details Patient educated in postural stetching and balance techniques today.    Person(s) Educated Patient    Methods Explanation;Demonstration;Tactile cues;Verbal cues    Comprehension Verbalized understanding;Returned demonstration;Verbal cues required;Need further instruction            PT Short Term Goals - 01/21/21 1438      PT SHORT  TERM GOAL #1   Title Pt will be independent with HEP in order to improve strength and balance in order to decrease fall risk and improve function at home.    Baseline 01/02/2021- Patient verbalize understanding of walking and seated exercises for home program. 01/21/21 patient has been more active and understands HEP.    Time 6    Period Weeks    Status Achieved    Target Date 01/10/21             PT Long Term Goals - 02/18/21 1403      PT LONG TERM GOAL #1   Title Pt will improve BERG by at least 3 points in order to demonstrate clinically significant improvement in balance.    Baseline 09/20/20: 32/56 (Previous discharge: 03/06/20: 46/56), 12/14: 32/56, 1/13: 31/56, 01/16/21 35/56, 02/06/2021=36/56. 02/18/2021= 37/56    Time 12    Period Weeks    Status On-going    Target Date 05/13/21      PT LONG TERM GOAL #2   Title Pt will decrease TUG to below 14 seconds/decrease in order to demonstrate decreased fall risk.    Baseline 09/20/20: 19.2s, 12/14: 17.48, 1/13: 16.65 sec. 01/02/2021=15.75 sec using 4WW, 01/21/21  14.37 seconds. 02/06/2021= 14.92 sec using 4WW. 02/18/2021= 14.5 sec using 4WW.    Time 12    Period Weeks    Status On-going    Target Date 05/13/21      PT LONG TERM GOAL #3   Title Pt will decrease  5TSTS by at least 3 seconds in order to demonstrate clinically significant improvement in LE strength.    Baseline 09/20/20: 23.1s with minA+1 for strength and balance (Previous discharge 03/06/20: 16.1s), 12/14: 20 sec, 1/13: 22 sec.   01/02/2021= 16.05 sec with light UE support. 01/21/21 01/21/21 37.90 seconds without UE, with UE support 12.93 seconds: 02/06/2021= with 1 UE 17.93 sec. 02/18/2021= 21.33 sec without UE support.    Time 12    Period Weeks    Status On-going    Target Date 05/13/21      PT LONG TERM GOAL #4   Title Pt will increase his self-selected 10MWT to >0.74 m/s in order to demonstrate improvement in gait speed and improved community ambulation    Baseline 09/20/20: self-selected: 14.4s = 0.69 m/s with rollator (Previous discharge 03/06/20: self-selected: 13.6s = 0.74 m/s), 12/14: 0.75 m/s, 1/13: 0.78 m/s. 01/02/2021= 0.85 m/s using 4WW, 01/16/21 with 4 WW .99 m/s. 02/06/2021= self selected = 0.93 m/s with 1VQ    Time 4    Period Weeks    Status Achieved      PT LONG TERM GOAL #5   Title Patient will increase six minute walk test distance to >1000 for progression to community ambulator and improve gait ability    Baseline 01/10/20  620 ft with rollator, 01/21/21  965 ft; 02/06/2021= 910 feet with 4WW.  02/18/2021= 950 feet with 4WW    Time 12    Period Weeks    Status On-going    Target Date 05/13/21                 Plan - 02/20/21 1457    Clinical Impression Statement Patient continues to be challenged with balance activities and presents with more retropulsion with eyes closed and turning 360 deg today. He was also educated in postural wall stretch with increased VC for correct form today. He performed well with dynamic balance using his walker without significant loss of balance  with head turn/nod today. Pt will benefit from further skilled PT to improve BLE strength, balance and functional mobility    Personal Factors and Comorbidities Age;Comorbidity 3+    Comorbidities HTN,  CVA, cholecystitis with cholecystostomy drain    Examination-Activity Limitations Bend;Lift;Squat;Locomotion Level;Stairs;Stand    Examination-Participation Restrictions Community Activity;Driving;Laundry;Meal Prep;Shop    Stability/Clinical Decision Making Unstable/Unpredictable    Rehab Potential Fair    PT Frequency 2x / week    PT Duration 4 weeks    PT Treatment/Interventions ADLs/Self Care Home Management;Canalith Repostioning;Cryotherapy;Electrical Stimulation;Iontophoresis 4mg /ml Dexamethasone;Moist Heat;Traction;Ultrasound;DME Instruction;Gait training;Stair training;Therapeutic activities;Therapeutic exercise;Functional mobility training;Balance training;Neuromuscular re-education;Patient/family education;Manual techniques;Dry needling;Vestibular;Joint Manipulations;Spinal Manipulations;Aquatic Therapy    PT Next Visit Plan Continue to focus on LE strengthening and balance, dynamic balance tasks    PT Home Exercise Plan added Wall posture stretch and static standing balance activities to HEP Today.    Consulted and Agree with Plan of Care Patient;Family member/caregiver           Patient will benefit from skilled therapeutic intervention in order to improve the following deficits and impairments:  Abnormal gait,Decreased balance,Decreased mobility,Difficulty walking,Decreased knowledge of precautions,Decreased safety awareness,Decreased activity tolerance,Decreased strength,Decreased endurance  Visit Diagnosis: Muscle weakness (generalized)  Unsteadiness on feet  Decreased strength of lower extremity  Other abnormalities of gait and mobility     Problem List Patient Active Problem List   Diagnosis Date Noted   Aneurysm, aorta, thoracic ([place]) 12/21/2020   Calculus of bile duct without cholecystitis and without obstruction    Non-small cell carcinoma of lung, right ([place]) 09/07/2020   Mass of upper lobe of right lung 07/13/2020   Calculus of bile duct with  cholecystitis without obstruction    Abnormal findings on imaging of biliary tract    Acute cholecystitis 05/02/2020   Chronic venous insufficiency 12/05/2019   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    CKD (chronic kidney disease), stage II    New onset atrial fibrillation ([place])    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   Goals of care, counseling/discussion    Palliative care by specialist    DNR (do not resuscitate) discussion    History of CVA (cerebrovascular accident) 05/22/2019   Persistent proteinuria 07/30/2018   Mobitz type 2 second degree heart block 07/13/2018   Chronic kidney disease, stage III (moderate) ([place]) 06/15/2018   Leg pain 05/06/2018   Lymphedema 05/06/2018   Coagulopathy ([place]) 07/07/2017   Lumbar spondylosis 03/10/2017   Elevated uric acid in blood 11/20/2016   BPH (benign prostatic hyperplasia) 05/29/2016   Peripheral vascular disease of lower extremity ([place]) 03/03/2016   Gallstone 12/31/2015   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   Atherosclerosis of aorta ([place]) 11/22/2015   Carotid artery narrowing 11/22/2015   Diverticulosis of colon 11/22/2015   Decreased creatinine clearance 11/22/2015   Arthritis, degenerative 11/22/2015   Lactose intolerance 11/22/2015   Basal cell carcinoma 11/22/2015   Essential hypertension 06/21/2015   Hyperlipemia 06/21/2015   GERD (gastroesophageal reflux disease) 06/21/2015   Calculus of kidney 11/18/2013    Lewis Moccasin, PT 02/20/2021, 2:59 PM  [facility] [address] [place], [place], [address] Phone: [phone]   Fax:  [phone]  Name: Jose Castro MRN: [id number] Date of Birth: 1926/05/21

## 2021-02-25 ENCOUNTER — Other Ambulatory Visit: Payer: Self-pay

## 2021-02-25 ENCOUNTER — Ambulatory Visit: Payer: Medicare Other

## 2021-02-25 DIAGNOSIS — R2689 Other abnormalities of gait and mobility: Secondary | ICD-10-CM

## 2021-02-25 DIAGNOSIS — R29898 Other symptoms and signs involving the musculoskeletal system: Secondary | ICD-10-CM

## 2021-02-25 DIAGNOSIS — M6281 Muscle weakness (generalized): Secondary | ICD-10-CM

## 2021-02-25 DIAGNOSIS — R2681 Unsteadiness on feet: Secondary | ICD-10-CM

## 2021-02-25 NOTE — Therapy (Signed)
[place] MAIN [facility] SERVICES [address] [place], [place], [address] Phone: [phone]   Fax:  [phone]  Physical Therapy Treatment  Patient Details  Name: Jose Castro MRN: [id number] Date of Birth: [birth year] Referring Provider (PT): Dr. Ancil Boozer   Encounter Date: 02/25/2021   PT End of Session - 02/25/21 2222    Visit Number 35    Number of Visits 57    Date for PT Re-Evaluation 05/13/21    Authorization Type eval: 09/20/20, Progress note 01/02/2021, PN on 08/05/1739, Recert = 07/14/4817-5/63/1497    PT Start Time 1350    PT Stop Time 1429    PT Time Calculation (min) 39 min    Equipment Utilized During Treatment Gait belt    Activity Tolerance Patient tolerated treatment well    Behavior During Therapy [facility] for tasks assessed/performed           Past Medical History:  Diagnosis Date  . Allergy    Penicillin  . Arthritis   . Basal cell carcinoma   . BPH (benign prostatic hyperplasia)   . Chronic kidney disease    had a kidney stone which per family was a cyst that was removed  . Dysrhythmia   . GERD (gastroesophageal reflux disease)   . History of kidney stones   . Hyperlipidemia   . Hypertension   . Left-sided low back pain with left-sided sciatica   . Lung mass   . Splenic vein thrombosis   . Stroke ([place]) 05/22/2019  . Thrombosis 12/2015   mural  area and no notation of heart attack    Past Surgical History:  Procedure Laterality Date  . APPENDECTOMY    . CATARACT EXTRACTION, BILATERAL    . CYSTOSCOPY WITH INSERTION OF UROLIFT    . ENDOSCOPIC RETROGRADE CHOLANGIOPANCREATOGRAPHY (ERCP) WITH PROPOFOL N/A 05/11/2020   Procedure: ENDOSCOPIC RETROGRADE CHOLANGIOPANCREATOGRAPHY (ERCP) WITH PROPOFOL;  Surgeon: [name], MD;  Location: [facility] ENDOSCOPY;  Service: Endoscopy;  Laterality: N/A;  . ERCP N/A 10/23/2020   Procedure: ENDOSCOPIC RETROGRADE CHOLANGIOPANCREATOGRAPHY (ERCP);  Surgeon: [name], MD;  Location: [facility]  ENDOSCOPY;  Service: Endoscopy;  Laterality: N/A;  . EYE SURGERY  08/2018  . IR EXCHANGE BILIARY DRAIN  05/04/2020  . KIDNEY STONE SURGERY    . KNEE SURGERY    . PACEMAKER INSERTION N/A 07/13/2018   Procedure: INSERTION PACEMAKER-DUEL CHAMBER INITIAL IMPLANT;  Surgeon: Isaias Cowman, MD;  Location: [facility] ORS;  Service: Cardiovascular;  Laterality: N/A;  . SPINE SURGERY      There were no vitals filed for this visit.   Subjective Assessment - 02/25/21 1354    Subjective Patient reports that he is feeling well and denied any pain today.    Patient is accompained by: Family member    Pertinent History Pt referred for deconditioning and gait instability. He has had a decline since he last finished therapy. Pt had a hospital admission from 05/02/20 to 05/14/20 for acute cholecystitis and is now s/p cholecystostomy drain placement. Drain is still in place and pt is waiting to hear when they plan to remove it. Pt discharged to SNF and then returned home. In addition pt underwent radiation therapy for a RUL lung mass/cancer and had his last radiation treatment in September of 2021. He reports that he has continued to walk at home as his primary exercise. Pt was previously seen at this clinic for PT/OT due to weakness/imbalance s/p CVA. Prior history from 09/21/19: Pt is a 87 year old male who presents  with imbalance and difficulty with gait following a right frontal CVA with left hemiparesis on 05/22/19.  He has completed inpatient and home health PT, and now presents for OP PT.  He was discharged from inpatient rehab at a supervision level of assistance, ambulating with a RW.  His family notes that he is impulsive and will try to ambulate without his RW at home.  Family is currently providing 24/7 supervision.    Limitations House hold activities;Walking    How long can you sit comfortably? no limitations    How long can you walk comfortably? he requires a RW    Patient Stated Goals Pt wants to be able to  walk his granddaughter down the aisle at her wedding November 2022    Currently in Pain? No/denies    Pain Onset More than a month ago             Neuromuscular re-ed:   360 deg turn- Patient demo turning in tighter radius x 2 trials toward left and 2 trials toward right exhibiting shuffling stepping with unsteadiness without UE support- leaning backward with retropulsion only once.   Staggered stance - hold 30 sec x 3 trials each leg- Patient with initial unsteadiness yet improved with practice today- CGA with occasional loss of balance.   Static stand with Feet apart in corner with 4WW in front and CGA- patient able to static stand with eyes open x 20 sec without unsteadiness today, followed by [facility] for 20 sec - patient required supervision and exp, Head turn x 5 times without loss of balance.  Static stand with Feet together- EO x 20 sec (mild increased sway and unsteadiness) with CGA then progressed to EC- increased sway with retropulsion and unable to self correct requiring physical assist at times.   Gait in hallway using [phone]- Focusing on forward walking with heading turning left to right x  80 feet x 2  trials and then switched to head nods x 50 feet x 4 trials. Patient exhibited decreased gait velocity yet did improve with practice   Sit to stand with left LE on purple pad to facilitate more weight through right LE x 10 reps without UE support.    Wall posture stretch-  Patient unable to stand erect with head touching wall due to forward head/thoracic kyphotic posture requiring less cues today for chin tuck and standing erect with scapulas touching wall. Patient performed 2 sets of 1 min without report of pain with decreased VC than last visit. .  Lumbar flex/ext against wall x 10 reps- Verbal cues to lean forward and then stand erect with shoulder blades back to wall.    Clinical Impression: Patient demonstrated less retropulsion with balance activities and improved turning radius  without significant loss of balance today. He performed sit to stand well with decreased pressure through left LE and less VC for wall postural stretch.  Pt will benefit from further skilled PT to improve BLE strength, balance and functional mobility                                          PT Education - 02/25/21 2221    Education Details cues for balance and gait safety    Person(s) Educated Patient    Methods Explanation;Demonstration;Tactile cues;Verbal cues    Comprehension Verbalized understanding;Need further instruction;Returned demonstration;Verbal cues required  PT Short Term Goals - 01/21/21 1438      PT SHORT TERM GOAL #1   Title Pt will be independent with HEP in order to improve strength and balance in order to decrease fall risk and improve function at home.    Baseline 01/02/2021- Patient verbalize understanding of walking and seated exercises for home program. 01/21/21 patient has been more active and understands HEP.    Time 6    Period Weeks    Status Achieved    Target Date 01/10/21             PT Long Term Goals - 02/18/21 1403      PT LONG TERM GOAL #1   Title Pt will improve BERG by at least 3 points in order to demonstrate clinically significant improvement in balance.    Baseline 09/20/20: 32/56 (Previous discharge: 03/06/20: 46/56), 12/14: 32/56, 1/13: 31/56, 01/16/21 35/56, 02/06/2021=36/56. 02/18/2021= 37/56    Time 12    Period Weeks    Status On-going    Target Date 05/13/21      PT LONG TERM GOAL #2   Title Pt will decrease TUG to below 14 seconds/decrease in order to demonstrate decreased fall risk.    Baseline 09/20/20: 19.2s, 12/14: 17.48, 1/13: 16.65 sec. 01/02/2021=15.75 sec using 4WW, 01/21/21  14.37 seconds. 02/06/2021= 14.92 sec using 4WW. 02/18/2021= 14.5 sec using 4WW.    Time 12    Period Weeks    Status On-going    Target Date 05/13/21      PT LONG TERM GOAL #3   Title Pt will decrease 5TSTS  by at least 3 seconds in order to demonstrate clinically significant improvement in LE strength.    Baseline 09/20/20: 23.1s with minA+1 for strength and balance (Previous discharge 03/06/20: 16.1s), 12/14: 20 sec, 1/13: 22 sec.   01/02/2021= 16.05 sec with light UE support. 01/21/21 01/21/21 37.90 seconds without UE, with UE support 12.93 seconds: 02/06/2021= with 1 UE 17.93 sec. 02/18/2021= 21.33 sec without UE support.    Time 12    Period Weeks    Status On-going    Target Date 05/13/21      PT LONG TERM GOAL #4   Title Pt will increase his self-selected 10MWT to >0.74 m/s in order to demonstrate improvement in gait speed and improved community ambulation    Baseline 09/20/20: self-selected: 14.4s = 0.69 m/s with rollator (Previous discharge 03/06/20: self-selected: 13.6s = 0.74 m/s), 12/14: 0.75 m/s, 1/13: 0.78 m/s. 01/02/2021= 0.85 m/s using 4WW, 01/16/21 with 4 WW .99 m/s. 02/06/2021= self selected = 0.93 m/s with 2BJ    Time 4    Period Weeks    Status Achieved      PT LONG TERM GOAL #5   Title Patient will increase six minute walk test distance to >1000 for progression to community ambulator and improve gait ability    Baseline 01/10/20  620 ft with rollator, 01/21/21  965 ft; 02/06/2021= 910 feet with 4WW.  02/18/2021= 950 feet with 4WW    Time 12    Period Weeks    Status On-going    Target Date 05/13/21                 Plan - 02/25/21 2223    Clinical Impression Statement Patient demonstrated less retropulsion with balance activities and improved turning radius without significant loss of balance today. He performed sit to stand well with decreased pressure through left LE and less VC for wall postural stretch.  Pt will benefit from further skilled PT to improve BLE strength, balance and functional mobility    Personal Factors and Comorbidities Age;Comorbidity 3+    Comorbidities HTN, CVA, cholecystitis with cholecystostomy drain    Examination-Activity Limitations  Bend;Lift;Squat;Locomotion Level;Stairs;Stand    Examination-Participation Restrictions Community Activity;Driving;Laundry;Meal Prep;Shop    Stability/Clinical Decision Making Unstable/Unpredictable    Rehab Potential Fair    PT Frequency 2x / week    PT Duration 4 weeks    PT Treatment/Interventions ADLs/Self Care Home Management;Canalith Repostioning;Cryotherapy;Electrical Stimulation;Iontophoresis 4mg /ml Dexamethasone;Moist Heat;Traction;Ultrasound;DME Instruction;Gait training;Stair training;Therapeutic activities;Therapeutic exercise;Functional mobility training;Balance training;Neuromuscular re-education;Patient/family education;Manual techniques;Dry needling;Vestibular;Joint Manipulations;Spinal Manipulations;Aquatic Therapy    PT Next Visit Plan Continue to focus on LE strengthening and balance, dynamic balance tasks    Consulted and Agree with Plan of Care Patient;Family member/caregiver           Patient will benefit from skilled therapeutic intervention in order to improve the following deficits and impairments:  Abnormal gait,Decreased balance,Decreased mobility,Difficulty walking,Decreased knowledge of precautions,Decreased safety awareness,Decreased activity tolerance,Decreased strength,Decreased endurance  Visit Diagnosis: Muscle weakness (generalized)  Unsteadiness on feet  Decreased strength of lower extremity  Other abnormalities of gait and mobility     Problem List Patient Active Problem List   Diagnosis Date Noted  . Aneurysm, aorta, thoracic ([place]) 12/21/2020  . Calculus of bile duct without cholecystitis and without obstruction   . Non-small cell carcinoma of lung, right ([place]) 09/07/2020  . Mass of upper lobe of right lung 07/13/2020  . Calculus of bile duct with cholecystitis without obstruction   . Abnormal findings on imaging of biliary tract   . Acute cholecystitis 05/02/2020  . Chronic venous insufficiency 12/05/2019  . Osteoarthritis of joint of toe  of right foot   . CKD (chronic kidney disease), stage II   . New onset atrial fibrillation ([place])   . 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 Goals of care, counseling/discussion   . Palliative care by specialist   . DNR (do not resuscitate) discussion   . History of CVA (cerebrovascular accident) 05/22/2019  . Persistent proteinuria 07/30/2018  . 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 Chronic kidney disease, stage III (moderate) ([place]) 06/15/2018  . Leg pain 05/06/2018  . Lymphedema 05/06/2018  . Coagulopathy ([place]) 07/07/2017  . Lumbar spondylosis 03/10/2017  . Elevated uric acid in blood 11/20/2016  . BPH (benign prostatic hyperplasia) 05/29/2016  . Peripheral vascular disease of lower extremity ([place]) 03/03/2016  . Gallstone 12/31/2015  . 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 Atherosclerosis of aorta ([place]) 11/22/2015  . Carotid artery narrowing 11/22/2015  . Diverticulosis of colon 11/22/2015  . Decreased creatinine clearance 11/22/2015  . Arthritis, degenerative 11/22/2015  . Lactose intolerance 11/22/2015  . Basal cell carcinoma 11/22/2015  . Essential hypertension 06/21/2015  . Hyperlipemia 06/21/2015  . GERD (gastroesophageal reflux disease) 06/21/2015  . Calculus of kidney 11/18/2013    Lewis Moccasin, PT 02/25/2021, 10:40 PM  [place] MAIN [facility] SERVICES [address] [place], [place], [address] Phone: [phone]   Fax:  [phone]  Name: Jose Castro MRN: [id number] Date of Birth: [birth year]

## 2021-02-27 ENCOUNTER — Other Ambulatory Visit: Payer: Self-pay

## 2021-02-27 ENCOUNTER — Ambulatory Visit: Payer: Medicare Other

## 2021-02-27 DIAGNOSIS — R2689 Other abnormalities of gait and mobility: Secondary | ICD-10-CM

## 2021-02-27 DIAGNOSIS — M6281 Muscle weakness (generalized): Secondary | ICD-10-CM

## 2021-02-27 DIAGNOSIS — R2681 Unsteadiness on feet: Secondary | ICD-10-CM

## 2021-02-27 DIAGNOSIS — R29898 Other symptoms and signs involving the musculoskeletal system: Secondary | ICD-10-CM

## 2021-02-27 NOTE — Therapy (Signed)
[place] MAIN [facility] SERVICES [address] [place], [place], [address] Phone: [phone]   Fax:  [phone]  Physical Therapy Treatment  Patient Details  Name: Jose Castro MRN: [id number] Date of Birth: [birth year] Referring Provider (PT): Dr. Ancil Boozer   Encounter Date: 02/27/2021   PT End of Session - 02/27/21 1405    Visit Number 36    Number of Visits 57    Date for PT Re-Evaluation 05/13/21    Authorization Type eval: 09/20/20, Progress note 01/02/2021, PN on 08/08/9210, Recert = [id number]/[id number]    PT Start Time 1351    PT Stop Time 1429    PT Time Calculation (min) 38 min    Equipment Utilized During Treatment Gait belt    Activity Tolerance Patient tolerated treatment well    Behavior During Therapy [facility] for tasks assessed/performed           Past Medical History:  Diagnosis Date  . Allergy    Penicillin  . Arthritis   . Basal cell carcinoma   . BPH (benign prostatic hyperplasia)   . Chronic kidney disease    had a kidney stone which per family was a cyst that was removed  . Dysrhythmia   . GERD (gastroesophageal reflux disease)   . History of kidney stones   . Hyperlipidemia   . Hypertension   . Left-sided low back pain with left-sided sciatica   . Lung mass   . Splenic vein thrombosis   . Stroke ([place]) 05/22/2019  . Thrombosis 12/2015   mural  area and no notation of heart attack    Past Surgical History:  Procedure Laterality Date  . APPENDECTOMY    . CATARACT EXTRACTION, BILATERAL    . CYSTOSCOPY WITH INSERTION OF UROLIFT    . ENDOSCOPIC RETROGRADE CHOLANGIOPANCREATOGRAPHY (ERCP) WITH PROPOFOL N/A 05/11/2020   Procedure: ENDOSCOPIC RETROGRADE CHOLANGIOPANCREATOGRAPHY (ERCP) WITH PROPOFOL;  Surgeon: [name], MD;  Location: [facility] ENDOSCOPY;  Service: Endoscopy;  Laterality: N/A;  . ERCP N/A 10/23/2020   Procedure: ENDOSCOPIC RETROGRADE CHOLANGIOPANCREATOGRAPHY (ERCP);  Surgeon: [name], MD;  Location: [facility]  ENDOSCOPY;  Service: Endoscopy;  Laterality: N/A;  . EYE SURGERY  08/2018  . IR EXCHANGE BILIARY DRAIN  05/04/2020  . KIDNEY STONE SURGERY    . KNEE SURGERY    . PACEMAKER INSERTION N/A 07/13/2018   Procedure: INSERTION PACEMAKER-DUEL CHAMBER INITIAL IMPLANT;  Surgeon: Isaias Cowman, MD;  Location: [facility] ORS;  Service: Cardiovascular;  Laterality: N/A;  . SPINE SURGERY      There were no vitals filed for this visit.   Subjective Assessment - 02/27/21 1405    Subjective Patient reports doing about the same- no new complaints    Patient is accompained by: Family member    Pertinent History Pt referred for deconditioning and gait instability. He has had a decline since he last finished therapy. Pt had a hospital admission from 05/02/20 to 05/14/20 for acute cholecystitis and is now s/p cholecystostomy drain placement. Drain is still in place and pt is waiting to hear when they plan to remove it. Pt discharged to SNF and then returned home. In addition pt underwent radiation therapy for a RUL lung mass/cancer and had his last radiation treatment in September of 2021. He reports that he has continued to walk at home as his primary exercise. Pt was previously seen at this clinic for PT/OT due to weakness/imbalance s/p CVA. Prior history from 09/21/19: Pt is a 87 year old male who presents with imbalance and  difficulty with gait following a right frontal CVA with left hemiparesis on 05/22/19.  He has completed inpatient and home health PT, and now presents for OP PT.  He was discharged from inpatient rehab at a supervision level of assistance, ambulating with a RW.  His family notes that he is impulsive and will try to ambulate without his RW at home.  Family is currently providing 24/7 supervision.    Limitations House hold activities;Walking    How long can you sit comfortably? no limitations    How long can you walk comfortably? he requires a RW    Patient Stated Goals Pt wants to be able to walk his  granddaughter down the aisle at her wedding November 2022    Currently in Pain? No/denies    Pain Onset More than a month ago           Interventions:   Gait trainer at 0.3 mph- avg gait speed = 0.4 m/s with shuffling gait. Patient able to improve with increased practice/time- completed 8 min total today with one rest break. Patient performed well as he reports this was his first time on a treadmill in a really long time. Reminders to keep moving while belt in motion.   Obstacle course set up involving cones- Figure 8 around cones and through narrow markers, Up and over a blue airex pad, focusing on maintaining adequate step length, continuous motion of walker, and tighter turning radius. Patient initially with unsteadiness and shuffling through course using his 4WW but did  improve with practice.   Sit to stand with 1 UE support x 10 reps. VC for hand placement and to scoot out to edge of seat. CGA initially but did improve to supervision after approx 4 reps.    Resistive gait: Forward using 7.5lb x 10 feet x 10 Focusing on balance- requiring CGA- Patient shuffling initially and difficulty increasing step length today. He did improve slightly with practice.     Clinical Impression: Patient challenged with walking on treadmill and able to increase his step length with VCs and practice. He continues to exhibit ongoing shuffling yet able to respond overall to cues to increase his step length and able to negotiate the resistive gait training and TM for the first time today without significant difficulty. Pt will benefit from further skilled PT to improve BLE strength, balance and functional mobility                        PT Education - 02/28/21 0646    Education Details gait sequencing cues while on TM    Person(s) Educated Patient    Methods Explanation;Demonstration;Tactile cues;Verbal cues    Comprehension Verbalized understanding;Returned demonstration;Verbal cues  required;Need further instruction            PT Short Term Goals - 01/21/21 1438      PT SHORT TERM GOAL #1   Title Pt will be independent with HEP in order to improve strength and balance in order to decrease fall risk and improve function at home.    Baseline 01/02/2021- Patient verbalize understanding of walking and seated exercises for home program. 01/21/21 patient has been more active and understands HEP.    Time 6    Period Weeks    Status Achieved    Target Date 01/10/21             PT Long Term Goals - 02/18/21 1403      PT LONG TERM  GOAL #1   Title Pt will improve BERG by at least 3 points in order to demonstrate clinically significant improvement in balance.    Baseline 09/20/20: 32/56 (Previous discharge: 03/06/20: 46/56), 12/14: 32/56, 1/13: 31/56, 01/16/21 35/56, 02/06/2021=36/56. 02/18/2021= 37/56    Time 12    Period Weeks    Status On-going    Target Date 05/13/21      PT LONG TERM GOAL #2   Title Pt will decrease TUG to below 14 seconds/decrease in order to demonstrate decreased fall risk.    Baseline 09/20/20: 19.2s, 12/14: 17.48, 1/13: 16.65 sec. 01/02/2021=15.75 sec using 4WW, 01/21/21  14.37 seconds. 02/06/2021= 14.92 sec using 4WW. 02/18/2021= 14.5 sec using 4WW.    Time 12    Period Weeks    Status On-going    Target Date 05/13/21      PT LONG TERM GOAL #3   Title Pt will decrease 5TSTS by at least 3 seconds in order to demonstrate clinically significant improvement in LE strength.    Baseline 09/20/20: 23.1s with minA+1 for strength and balance (Previous discharge 03/06/20: 16.1s), 12/14: 20 sec, 1/13: 22 sec.   01/02/2021= 16.05 sec with light UE support. 01/21/21 01/21/21 37.90 seconds without UE, with UE support 12.93 seconds: 02/06/2021= with 1 UE 17.93 sec. 02/18/2021= 21.33 sec without UE support.    Time 12    Period Weeks    Status On-going    Target Date 05/13/21      PT LONG TERM GOAL #4   Title Pt will increase his self-selected 10MWT to >0.74 m/s in  order to demonstrate improvement in gait speed and improved community ambulation    Baseline 09/20/20: self-selected: 14.4s = 0.69 m/s with rollator (Previous discharge 03/06/20: self-selected: 13.6s = 0.74 m/s), 12/14: 0.75 m/s, 1/13: 0.78 m/s. 01/02/2021= 0.85 m/s using 4WW, 01/16/21 with 4 WW .99 m/s. 02/06/2021= self selected = 0.93 m/s with 8HY    Time 4    Period Weeks    Status Achieved      PT LONG TERM GOAL #5   Title Patient will increase six minute walk test distance to >1000 for progression to community ambulator and improve gait ability    Baseline 01/10/20  620 ft with rollator, 01/21/21  965 ft; 02/06/2021= 910 feet with 4WW.  02/18/2021= 950 feet with 4WW    Time 12    Period Weeks    Status On-going    Target Date 05/13/21                 Plan - 02/27/21 1406    Clinical Impression Statement Patient challenged with walking on treadmill and able to increase his step length with VCs and practice. He continues to exhibit ongoing shuffling yet able to respond overall to cues to increase his step length and able to negotiate the resistive gait training and TM for the first time today without significant difficulty. Pt will benefit from further skilled PT to improve BLE strength, balance and functional mobility    Personal Factors and Comorbidities Age;Comorbidity 3+    Comorbidities HTN, CVA, cholecystitis with cholecystostomy drain    Examination-Activity Limitations Bend;Lift;Squat;Locomotion Level;Stairs;Stand    Examination-Participation Restrictions Community Activity;Driving;Laundry;Meal Prep;Shop    Stability/Clinical Decision Making Unstable/Unpredictable    Rehab Potential Fair    PT Frequency 2x / week    PT Duration 4 weeks    PT Treatment/Interventions ADLs/Self Care Home Management;Canalith Repostioning;Cryotherapy;Electrical Stimulation;Iontophoresis 4mg /ml Dexamethasone;Moist Heat;Traction;Ultrasound;DME Instruction;Gait training;Stair training;Therapeutic  activities;Therapeutic exercise;Functional mobility training;Balance training;Neuromuscular  re-education;Patient/family education;Manual techniques;Dry needling;Vestibular;Joint Manipulations;Spinal Manipulations;Aquatic Therapy    PT Next Visit Plan Continue to focus on LE strengthening and balance, dynamic balance tasks    Consulted and Agree with Plan of Care Patient;Family member/caregiver           Patient will benefit from skilled therapeutic intervention in order to improve the following deficits and impairments:  Abnormal gait,Decreased balance,Decreased mobility,Difficulty walking,Decreased knowledge of precautions,Decreased safety awareness,Decreased activity tolerance,Decreased strength,Decreased endurance  Visit Diagnosis: Muscle weakness (generalized)  Unsteadiness on feet  Decreased strength of lower extremity  Other abnormalities of gait and mobility     Problem List Patient Active Problem List   Diagnosis Date Noted  . Aneurysm, aorta, thoracic ([place]) 12/21/2020  . Calculus of bile duct without cholecystitis and without obstruction   . Non-small cell carcinoma of lung, right ([place]) 09/07/2020  . Mass of upper lobe of right lung 07/13/2020  . Calculus of bile duct with cholecystitis without obstruction   . Abnormal findings on imaging of biliary tract   . Acute cholecystitis 05/02/2020  . Chronic venous insufficiency 12/05/2019  . 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 CKD (chronic kidney disease), stage II   . New onset atrial fibrillation ([place])   . 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 Goals of care, counseling/discussion   . Palliative care by specialist   . DNR (do not resuscitate) discussion   . History of CVA (cerebrovascular accident) 05/22/2019  . Persistent proteinuria 07/30/2018  . 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 Chronic kidney disease, stage III (moderate) ([place]) 06/15/2018  . Leg pain 05/06/2018  .  Lymphedema 05/06/2018  . Coagulopathy ([place]) 07/07/2017  . Lumbar spondylosis 03/10/2017  . Elevated uric acid in blood 11/20/2016  . BPH (benign prostatic hyperplasia) 05/29/2016  . Peripheral vascular disease of lower extremity ([place]) 03/03/2016  . Gallstone 12/31/2015  . 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 Atherosclerosis of aorta ([place]) 11/22/2015  . Carotid artery narrowing 11/22/2015  . Diverticulosis of colon 11/22/2015  . Decreased creatinine clearance 11/22/2015  . Arthritis, degenerative 11/22/2015  . Lactose intolerance 11/22/2015  . Basal cell carcinoma 11/22/2015  . Essential hypertension 06/21/2015  . Hyperlipemia 06/21/2015  . GERD (gastroesophageal reflux disease) 06/21/2015  . Calculus of kidney 11/18/2013    Lewis Moccasin, PT 02/28/2021, 9:18 AM  [place] MAIN [facility] SERVICES [address] [place], [place], [address] Phone: [phone]   Fax:  [phone]  Name: Jose Castro MRN: [id number] Date of Birth: 1926-07-05

## 2021-03-01 ENCOUNTER — Other Ambulatory Visit: Payer: Self-pay | Admitting: Family Medicine

## 2021-03-01 MED ORDER — FINASTERIDE 5 MG PO TABS: 5.0000 mg | ORAL_TABLET | Freq: Every day | ORAL | 0 refills | Status: DC

## 2021-03-01 NOTE — Telephone Encounter (Signed)
   Notes to clinic:  medication filled by a different provider  Review for refill  Patient is almost out    Requested Prescriptions  Pending Prescriptions Disp Refills   finasteride (PROSCAR) 5 MG tablet 30 tablet 0    Sig: Take 1 tablet (5 mg total) by mouth daily.      Urology: 5-alpha Reductase Inhibitors Passed - 03/01/2021  1:33 PM      Passed - Valid encounter within last 12 months    Recent Outpatient Visits           5 months ago Moderate protein-calorie malnutrition [facility])   [facility] Steele Sizer, MD   8 months ago History of cholecystitis   [facility] Steele Sizer, MD   10 months ago Abdominal pain, bilateral upper quadrant   [facility] Steele Sizer, MD   12 months ago Stage 3a chronic kidney disease   [facility] Steele Sizer, MD   2 years ago Essential hypertension   [place] [place]       Future Appointments             In 1 week Steele Sizer, MD [facility]

## 2021-03-01 NOTE — Telephone Encounter (Signed)
Medication Refill - Medication:   finasteride (PROSCAR) 5 MG tablet    Has the patient contacted their pharmacy? Yes.  No refills and Pt is about to run out.   Preferred Pharmacy (with phone number or street name):   [place], [place] - [place]  [place] [address]  Phone: [phone] Fax: [phone]     Agent: Please be advised that RX refills may take up to 3 business days. We ask that you follow-up with your pharmacy.

## 2021-03-05 ENCOUNTER — Other Ambulatory Visit: Payer: Self-pay

## 2021-03-05 ENCOUNTER — Ambulatory Visit: Payer: Medicare Other | Attending: Family Medicine

## 2021-03-05 DIAGNOSIS — R29898 Other symptoms and signs involving the musculoskeletal system: Secondary | ICD-10-CM | POA: Diagnosis present

## 2021-03-05 DIAGNOSIS — R278 Other lack of coordination: Secondary | ICD-10-CM | POA: Diagnosis present

## 2021-03-05 DIAGNOSIS — R262 Difficulty in walking, not elsewhere classified: Secondary | ICD-10-CM | POA: Diagnosis present

## 2021-03-05 DIAGNOSIS — R2681 Unsteadiness on feet: Secondary | ICD-10-CM | POA: Insufficient documentation

## 2021-03-05 DIAGNOSIS — M6281 Muscle weakness (generalized): Secondary | ICD-10-CM | POA: Insufficient documentation

## 2021-03-05 DIAGNOSIS — R2689 Other abnormalities of gait and mobility: Secondary | ICD-10-CM | POA: Insufficient documentation

## 2021-03-05 DIAGNOSIS — R269 Unspecified abnormalities of gait and mobility: Secondary | ICD-10-CM | POA: Insufficient documentation

## 2021-03-05 NOTE — Therapy (Signed)
[facility] [address] [place], [place], [address] Phone: [phone]   Fax:  [phone]  Physical Therapy Treatment  Patient Details  Name: Jose Castro MRN: [id number] Date of Birth: 1926-02-10 Referring Provider (PT): Dr. Ancil Boozer   Encounter Date: 03/05/2021   PT End of Session - 03/05/21 1241    Visit Number 37    Number of Visits 47    Date for PT Re-Evaluation 05/13/21    Authorization Type eval: 09/20/20, Progress note 01/02/2021, PN on 12/08/15, Recert = [id number]    PT Start Time 1352    PT Stop Time 1429    PT Time Calculation (min) 37 min    Equipment Utilized During Treatment Gait belt    Activity Tolerance Patient tolerated treatment well    Behavior During Therapy [facility] for tasks assessed/performed           Past Medical History:  Diagnosis Date  . Allergy    Penicillin  . Arthritis   . Basal cell carcinoma   . BPH (benign prostatic hyperplasia)   . Chronic kidney disease    had a kidney stone which per family was a cyst that was removed  . Dysrhythmia   . GERD (gastroesophageal reflux disease)   . History of kidney stones   . Hyperlipidemia   . Hypertension   . Left-sided low back pain with left-sided sciatica   . Lung mass   . Splenic vein thrombosis   . Stroke ([place]) 05/22/2019  . Thrombosis 12/2015   mural  area and no notation of heart attack    Past Surgical History:  Procedure Laterality Date  . APPENDECTOMY    . CATARACT EXTRACTION, BILATERAL    . CYSTOSCOPY WITH INSERTION OF UROLIFT    . ENDOSCOPIC RETROGRADE CHOLANGIOPANCREATOGRAPHY (ERCP) WITH PROPOFOL N/A 05/11/2020   Procedure: ENDOSCOPIC RETROGRADE CHOLANGIOPANCREATOGRAPHY (ERCP) WITH PROPOFOL;  Surgeon: [name], MD;  Location: [facility] ENDOSCOPY;  Service: Endoscopy;  Laterality: N/A;  . ERCP N/A 10/23/2020   Procedure: ENDOSCOPIC RETROGRADE CHOLANGIOPANCREATOGRAPHY (ERCP);  Surgeon: [name], MD;  Location: [facility]  ENDOSCOPY;  Service: Endoscopy;  Laterality: N/A;  . EYE SURGERY  08/2018  . IR EXCHANGE BILIARY DRAIN  05/04/2020  . KIDNEY STONE SURGERY    . KNEE SURGERY    . PACEMAKER INSERTION N/A 07/13/2018   Procedure: INSERTION PACEMAKER-DUEL CHAMBER INITIAL IMPLANT;  Surgeon: Isaias Cowman, MD;  Location: [facility] ORS;  Service: Cardiovascular;  Laterality: N/A;  . SPINE SURGERY      There were no vitals filed for this visit.   Subjective Assessment - 03/05/21 1400    Subjective Patient reports having some right shoulder pain and states somedays it is just sore.    Patient is accompained by: Family member    Pertinent History Pt referred for deconditioning and gait instability. He has had a decline since he last finished therapy. Pt had a hospital admission from 05/02/20 to 05/14/20 for acute cholecystitis and is now s/p cholecystostomy drain placement. Drain is still in place and pt is waiting to hear when they plan to remove it. Pt discharged to SNF and then returned home. In addition pt underwent radiation therapy for a RUL lung mass/cancer and had his last radiation treatment in September of 2021. He reports that he has continued to walk at home as his primary exercise. Pt was previously seen at this clinic for PT/OT due to weakness/imbalance s/p CVA. Prior history from 09/21/19: Pt is a 87 year old male  who presents with imbalance and difficulty with gait following a right frontal CVA with left hemiparesis on 05/22/19.  He has completed inpatient and home health PT, and now presents for OP PT.  He was discharged from inpatient rehab at a supervision level of assistance, ambulating with a RW.  His family notes that he is impulsive and will try to ambulate without his RW at home.  Family is currently providing 24/7 supervision.    Limitations House hold activities;Walking    How long can you sit comfortably? no limitations    How long can you walk comfortably? he requires a RW    Patient Stated Goals Pt  wants to be able to walk his granddaughter down the aisle at her wedding November 2022    Currently in Pain? Yes    Pain Location Shoulder    Pain Orientation Right    Pain Descriptors / Indicators Aching    Pain Type Chronic pain    Pain Onset More than a month ago    Pain Frequency Occasional           Moist heat to right shoulder x 10 min while performing seated LE strengthening.  Seated LE strengthening:  Step tapping onto 6"step x 1 min x 2 sets Side step over orange hurdle x 12 reps  Seated hip march 3# AW - 12 reps BLE Seated knee ext 3# AW- 12 reps BLE Hip add squeeze on rainbow ball x 5 sec hold x 10 reps  Standing activities in //bars:  Forward/backward gait with 3# AW - focusing on step length. Cues to take a longer step backward- improved with practice x 5.  Side stepping over 4 hedgehog disc- focusing on decreased UE support with practice- x 5 patient able to improve with VC x 10 times each. Forward steps over hedehog with left LE - length of //bars then back with right LE over bars x 5 trials.   Education provided throughout session via VC/TC and demonstration to facilitate movement at target joints and correct muscle activation for all testing and exercises performed.  Clinical Impression: Patient reported feeling better after heat to left shoulder. He was able to improve with steps forward/backward with VC today and no difficulty with seated LE strengthening. Pt will benefit from further skilled PT to improve BLE strength, balance and functional mobility                        PT Education - 03/06/21 1240    Education Details specific strengthening and balance activities.    Person(s) Educated Patient    Methods Explanation;Demonstration;Tactile cues;Verbal cues    Comprehension Verbalized understanding;Verbal cues required;Need further instruction;Returned demonstration;Tactile cues required            PT Short Term Goals - 01/21/21 1438       PT SHORT TERM GOAL #1   Title Pt will be independent with HEP in order to improve strength and balance in order to decrease fall risk and improve function at home.    Baseline 01/02/2021- Patient verbalize understanding of walking and seated exercises for home program. 01/21/21 patient has been more active and understands HEP.    Time 6    Period Weeks    Status Achieved    Target Date 01/10/21             PT Long Term Goals - 02/18/21 1403      PT LONG TERM GOAL #1  Title Pt will improve BERG by at least 3 points in order to demonstrate clinically significant improvement in balance.    Baseline 09/20/20: 32/56 (Previous discharge: 03/06/20: 46/56), 12/14: 32/56, 1/13: 31/56, 01/16/21 35/56, 02/06/2021=36/56. 02/18/2021= 37/56    Time 12    Period Weeks    Status On-going    Target Date 05/13/21      PT LONG TERM GOAL #2   Title Pt will decrease TUG to below 14 seconds/decrease in order to demonstrate decreased fall risk.    Baseline 09/20/20: 19.2s, 12/14: 17.48, 1/13: 16.65 sec. 01/02/2021=15.75 sec using 4WW, 01/21/21  14.37 seconds. 02/06/2021= 14.92 sec using 4WW. 02/18/2021= 14.5 sec using 4WW.    Time 12    Period Weeks    Status On-going    Target Date 05/13/21      PT LONG TERM GOAL #3   Title Pt will decrease 5TSTS by at least 3 seconds in order to demonstrate clinically significant improvement in LE strength.    Baseline 09/20/20: 23.1s with minA+1 for strength and balance (Previous discharge 03/06/20: 16.1s), 12/14: 20 sec, 1/13: 22 sec.   01/02/2021= 16.05 sec with light UE support. 01/21/21 01/21/21 37.90 seconds without UE, with UE support 12.93 seconds: 02/06/2021= with 1 UE 17.93 sec. 02/18/2021= 21.33 sec without UE support.    Time 12    Period Weeks    Status On-going    Target Date 05/13/21      PT LONG TERM GOAL #4   Title Pt will increase his self-selected 10MWT to >0.74 m/s in order to demonstrate improvement in gait speed and improved community ambulation     Baseline 09/20/20: self-selected: 14.4s = 0.69 m/s with rollator (Previous discharge 03/06/20: self-selected: 13.6s = 0.74 m/s), 12/14: 0.75 m/s, 1/13: 0.78 m/s. 01/02/2021= 0.85 m/s using 4WW, 01/16/21 with 4 WW .99 m/s. 02/06/2021= self selected = 0.93 m/s with 9FA    Time 4    Period Weeks    Status Achieved      PT LONG TERM GOAL #5   Title Patient will increase six minute walk test distance to >1000 for progression to community ambulator and improve gait ability    Baseline 01/10/20  620 ft with rollator, 01/21/21  965 ft; 02/06/2021= 910 feet with 4WW.  02/18/2021= 950 feet with 4WW    Time 12    Period Weeks    Status On-going    Target Date 05/13/21                 Plan - 03/05/21 1405    Clinical Impression Statement Patient reported feeling better after heat to left shoulder. He was able to improve with steps forward/backward with VC today and no difficulty with seated LE strengthening. Pt will benefit from further skilled PT to improve BLE strength, balance and functional mobility    Personal Factors and Comorbidities Age;Comorbidity 3+    Comorbidities HTN, CVA, cholecystitis with cholecystostomy drain    Examination-Activity Limitations Bend;Lift;Squat;Locomotion Level;Stairs;Stand    Examination-Participation Restrictions Community Activity;Driving;Laundry;Meal Prep;Shop    Stability/Clinical Decision Making Unstable/Unpredictable    Rehab Potential Fair    PT Frequency 2x / week    PT Duration 4 weeks    PT Treatment/Interventions ADLs/Self Care Home Management;Canalith Repostioning;Cryotherapy;Electrical Stimulation;Iontophoresis 4mg /ml Dexamethasone;Moist Heat;Traction;Ultrasound;DME Instruction;Gait training;Stair training;Therapeutic activities;Therapeutic exercise;Functional mobility training;Balance training;Neuromuscular re-education;Patient/family education;Manual techniques;Dry needling;Vestibular;Joint Manipulations;Spinal Manipulations;Aquatic Therapy    PT Next Visit  Plan Continue to focus on LE strengthening and balance, dynamic balance tasks    Consulted and  Agree with Plan of Care Patient;Family member/caregiver           Patient will benefit from skilled therapeutic intervention in order to improve the following deficits and impairments:  Abnormal gait,Decreased balance,Decreased mobility,Difficulty walking,Decreased knowledge of precautions,Decreased safety awareness,Decreased activity tolerance,Decreased strength,Decreased endurance  Visit Diagnosis: Muscle weakness (generalized)  Unsteadiness on feet  Decreased strength of lower extremity  Other abnormalities of gait and mobility  Other lack of coordination     Problem List Patient Active Problem List   Diagnosis Date Noted  . Aneurysm, aorta, thoracic ([place]) 12/21/2020  . Calculus of bile duct without cholecystitis and without obstruction   . Non-small cell carcinoma of lung, right ([place]) 09/07/2020  . Mass of upper lobe of right lung 07/13/2020  . Calculus of bile duct with cholecystitis without obstruction   . Abnormal findings on imaging of biliary tract   . Acute cholecystitis 05/02/2020  . Chronic venous insufficiency 12/05/2019  . 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 CKD (chronic kidney disease), stage II   . New onset atrial fibrillation ([place])   . 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 Goals of care, counseling/discussion   . Palliative care by specialist   . DNR (do not resuscitate) discussion   . History of CVA (cerebrovascular accident) 05/22/2019  . Persistent proteinuria 07/30/2018  . 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 Chronic kidney disease, stage III (moderate) ([place]) 06/15/2018  . Leg pain 05/06/2018  . Lymphedema 05/06/2018  . Coagulopathy ([place]) 07/07/2017  . Lumbar spondylosis 03/10/2017  . Elevated uric acid in blood 11/20/2016  . BPH (benign prostatic hyperplasia) 05/29/2016  . Peripheral vascular disease of  lower extremity ([place]) 03/03/2016  . Gallstone 12/31/2015  . 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 Atherosclerosis of aorta ([place]) 11/22/2015  . Carotid artery narrowing 11/22/2015  . Diverticulosis of colon 11/22/2015  . Decreased creatinine clearance 11/22/2015  . Arthritis, degenerative 11/22/2015  . Lactose intolerance 11/22/2015  . Basal cell carcinoma 11/22/2015  . Essential hypertension 06/21/2015  . Hyperlipemia 06/21/2015  . GERD (gastroesophageal reflux disease) 06/21/2015  . Calculus of kidney 11/18/2013    Lewis Moccasin, PT 03/06/2021, 1:07 PM  [facility] [facility] [address] [place], [place], [address] Phone: [phone]   Fax:  [phone]  Name: Jose Castro MRN: [id number] Date of Birth: May 10, 1926

## 2021-03-07 ENCOUNTER — Ambulatory Visit: Payer: Medicare Other

## 2021-03-11 ENCOUNTER — Ambulatory Visit (INDEPENDENT_AMBULATORY_CARE_PROVIDER_SITE_OTHER): Payer: Medicare Other | Admitting: Family Medicine

## 2021-03-11 ENCOUNTER — Other Ambulatory Visit: Payer: Self-pay

## 2021-03-11 ENCOUNTER — Encounter: Payer: Self-pay | Admitting: Family Medicine

## 2021-03-11 VITALS — BP 122/68 | HR 93 | Temp 97.9°F | Resp 16 | Ht 70.0 in | Wt 152.0 lb

## 2021-03-11 DIAGNOSIS — I739 Peripheral vascular disease, unspecified: Secondary | ICD-10-CM | POA: Diagnosis not present

## 2021-03-11 DIAGNOSIS — N401 Enlarged prostate with lower urinary tract symptoms: Secondary | ICD-10-CM

## 2021-03-11 DIAGNOSIS — E440 Moderate protein-calorie malnutrition: Secondary | ICD-10-CM | POA: Diagnosis not present

## 2021-03-11 DIAGNOSIS — I482 Chronic atrial fibrillation, unspecified: Secondary | ICD-10-CM | POA: Diagnosis not present

## 2021-03-11 DIAGNOSIS — I712 Thoracic aortic aneurysm, without rupture, unspecified: Secondary | ICD-10-CM

## 2021-03-11 DIAGNOSIS — R739 Hyperglycemia, unspecified: Secondary | ICD-10-CM

## 2021-03-11 DIAGNOSIS — R5381 Other malaise: Secondary | ICD-10-CM

## 2021-03-11 DIAGNOSIS — I700 Atherosclerosis of aorta: Secondary | ICD-10-CM

## 2021-03-11 DIAGNOSIS — I69354 Hemiplegia and hemiparesis following cerebral infarction affecting left non-dominant side: Secondary | ICD-10-CM | POA: Insufficient documentation

## 2021-03-11 DIAGNOSIS — Z8719 Personal history of other diseases of the digestive system: Secondary | ICD-10-CM

## 2021-03-11 DIAGNOSIS — R351 Nocturia: Secondary | ICD-10-CM

## 2021-03-11 DIAGNOSIS — D692 Other nonthrombocytopenic purpura: Secondary | ICD-10-CM

## 2021-03-11 DIAGNOSIS — C3491 Malignant neoplasm of unspecified part of right bronchus or lung: Secondary | ICD-10-CM

## 2021-03-11 DIAGNOSIS — E538 Deficiency of other specified B group vitamins: Secondary | ICD-10-CM

## 2021-03-11 DIAGNOSIS — N1831 Chronic kidney disease, stage 3a: Secondary | ICD-10-CM

## 2021-03-11 MED ORDER — FINASTERIDE 5 MG PO TABS: 5.0000 mg | ORAL_TABLET | Freq: Every day | ORAL | 1 refills | Status: DC

## 2021-03-11 NOTE — Progress Notes (Signed)
Name: Jose Castro   MRN: [id number]    DOB: Feb 11, 1926   Date:03/11/2021       Progress Note  Subjective  Chief Complaint  Follow Up  HPI  Percutaneous cholecystostomy : he was admitted on 05/02/2020 and stayed until June 14 th 2021 , stayed at [facility] rehab for almost 2 weeks after that. He finally went home for the past couple of weeks. He had a percutaneous cholecystotomy placed and has been doing well.Appetite is not back to normal yet, but has improved, weight is stable now.   Malnutrition: his weight is stable now, but still malnourished , discussed having snacks around, regular meals. They have a caregiver at the house and advised to monitor intake   Solitary lung mass: diagnosed this Summer by CT chest, he is seeing Dr. Arthor Captain and Dr. Donella Stade, had radiation for possible bronchogenic carcinoma since having a biopsy was too risky, he finished radiation therapy, tolerated it well.   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 He has been taking Eliquis given by Dr. Clayborn Bigness, he completed rehab for left side weakness but is still having some lower extremity weakness and uses a walker to assist with gait. He is still getting PT and is gradually getting faster  Hyperlipidemia: reviewed labs done April 2021  he has been taking Crestor and denies side effects, we will recheck labs today   GERD: he is on PPI and symptoms have been controlled, no problems at this time   Senile purpura : on upper extremities, on Eliquis and expected for age, reassurance given   PAD; he is wearing compression stocking hoses, seen by vascular surgeon this year, on statin and eliquis, denies claudication but only walks short distances due to left side weakness and knee problems  Patient Active Problem List   Diagnosis Date Noted  . Aneurysm, aorta,  thoracic ([place]) 12/21/2020  . Calculus of bile duct without cholecystitis and without obstruction   . Non-small cell carcinoma of lung, right ([place]) 09/07/2020  . Mass of upper lobe of right lung 07/13/2020  . Calculus of bile duct with cholecystitis without obstruction   . Abnormal findings on imaging of biliary tract   . Acute cholecystitis 05/02/2020  . Chronic venous insufficiency 12/05/2019  . 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 CKD (chronic kidney disease), stage II   . New onset atrial fibrillation ([place])   . Right middle cerebral artery stroke ([place]) 05/27/2019  . Goals of care, counseling/discussion   . Palliative care by specialist   . DNR (do not resuscitate) discussion   . History of CVA (cerebrovascular accident) 05/22/2019  . Persistent proteinuria 07/30/2018  . 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 Chronic kidney disease, stage III (moderate) ([place]) 06/15/2018  . Leg pain 05/06/2018  . Lymphedema 05/06/2018  . Coagulopathy ([place]) 07/07/2017  . Lumbar spondylosis 03/10/2017  . Elevated uric acid in blood 11/20/2016  . BPH (benign prostatic hyperplasia) 05/29/2016  . Peripheral vascular disease of lower extremity ([place]) 03/03/2016  . Gallstone 12/31/2015  . 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 Atherosclerosis of aorta ([place]) 11/22/2015  . Carotid artery narrowing 11/22/2015  . Diverticulosis of colon 11/22/2015  . Decreased creatinine clearance 11/22/2015  . Arthritis, degenerative 11/22/2015  . Lactose intolerance 11/22/2015  .  Basal cell carcinoma 11/22/2015  . Essential hypertension 06/21/2015  . Hyperlipemia 06/21/2015  . GERD (gastroesophageal reflux disease) 06/21/2015  . Calculus of kidney 11/18/2013    Past Surgical History:  Procedure Laterality Date  . APPENDECTOMY    . CATARACT EXTRACTION, BILATERAL    . CYSTOSCOPY WITH INSERTION OF UROLIFT    . ENDOSCOPIC RETROGRADE CHOLANGIOPANCREATOGRAPHY  (ERCP) WITH PROPOFOL N/A 05/11/2020   Procedure: ENDOSCOPIC RETROGRADE CHOLANGIOPANCREATOGRAPHY (ERCP) WITH PROPOFOL;  Surgeon: [name], MD;  Location: [facility] ENDOSCOPY;  Service: Endoscopy;  Laterality: N/A;  . ERCP N/A 10/23/2020   Procedure: ENDOSCOPIC RETROGRADE CHOLANGIOPANCREATOGRAPHY (ERCP);  Surgeon: [name], MD;  Location: [facility] ENDOSCOPY;  Service: Endoscopy;  Laterality: N/A;  . EYE SURGERY  08/2018  . IR EXCHANGE BILIARY DRAIN  05/04/2020  . KIDNEY STONE SURGERY    . KNEE SURGERY    . PACEMAKER INSERTION N/A 07/13/2018   Procedure: INSERTION PACEMAKER-DUEL CHAMBER INITIAL IMPLANT;  Surgeon: Isaias Cowman, MD;  Location: [facility] ORS;  Service: Cardiovascular;  Laterality: N/A;  . SPINE SURGERY      Family History  Problem Relation Age of Onset  . Cancer Brother        bladder cancer with mets  . Heart disease Mother   . Aortic aneurysm Mother   . Heart attack Maternal Aunt   . Heart attack Maternal Uncle     Social History   Tobacco Use  . Smoking status: Former Smoker    Packs/day: 0.00    Years: 16.00    Pack years: 0.00    Types: Cigarettes    Start date: 10/21/1944    Quit date: 10/21/1960    Years since quitting: 60.4  . Smokeless tobacco: Never Used  Substance Use Topics  . Alcohol use: Yes    Alcohol/week: 3.0 standard drinks    Types: 3 Standard drinks or equivalent per week    Comment: once a week when he goes out to dinner-socially     Current Outpatient Medications:  .  acetaminophen (TYLENOL) 325 MG tablet, Take 2 tablets (650 mg total) by mouth every 4 (four) hours as needed for mild pain (or temp > 37.5 C (99.5 F)). (Patient taking differently: Take 650 mg by mouth as needed for mild pain (or temp > 37.5 C (99.5 F)).), Disp:  , Rfl:  .  apixaban (ELIQUIS) 2.5 MG TABS tablet, Take 1 tablet (2.5 mg total) by mouth 2 (two) times daily., Disp: 60 tablet, Rfl: 0 .  Cholecalciferol (VITAMIN D3) 50 MCG (2000 UT) TABS, Take 2,000 Units by mouth  daily., Disp: 30 tablet, Rfl: 0 .  Ensure Max Protein (ENSURE MAX PROTEIN) LIQD, Take 330 mLs (11 oz total) by mouth 2 (two) times daily., Disp: , Rfl:  .  finasteride (PROSCAR) 5 MG tablet, Take 1 tablet (5 mg total) by mouth daily., Disp: 30 tablet, Rfl: 0 .  metoprolol succinate (TOPROL XL) 25 MG 24 hr tablet, Take 1 tablet (25 mg total) by mouth daily., Disp: 30 tablet, Rfl: 0 .  Omega-3 Fatty Acids (FISH OIL) 1000 MG CAPS, Take 1,000 mg by mouth daily. , Disp: , Rfl:  .  pantoprazole (PROTONIX) 40 MG tablet, TAKE ONE TABLET EVERY DAY, Disp: 90 tablet, Rfl: 3 .  rosuvastatin (CRESTOR) 40 MG tablet, Take 1 tablet (40 mg total) by mouth daily at 6 PM., Disp: 30 tablet, Rfl: 0 .  ursodiol (ACTIGALL) 300 MG capsule, Take 300 mg by mouth in the morning and at bedtime., Disp: , Rfl:  .  ondansetron (ZOFRAN ODT) 4 MG disintegrating tablet, Take 1 tablet (4 mg total) by mouth every 8 (eight) hours as needed for nausea or vomiting. (Patient not taking: No sig reported), Disp: 20 tablet, Rfl: 0  Allergies  Allergen Reactions  . Penicillins Itching and Swelling    Pt reported arm rash and swelling with penicillin administration while he was in the [employer].  However, pt was able to tolerate IV Unasyn without any problem, so penicillin allergy unlikely to be real or serious.  05/14/2020.    I personally reviewed active problem list, medication list, allergies, family history, social history, health maintenance with the patient/caregiver today.   ROS  Constitutional: Negative for fever or weight change.  Respiratory: Negative for cough and shortness of breath.   Cardiovascular: Negative for chest pain or palpitations.  Gastrointestinal: Negative for abdominal pain, no bowel changes.  Musculoskeletal: Negative for gait problem or joint swelling.  Skin: Negative for rash.  Neurological: Negative for dizziness or headache.  No other specific complaints in a complete review of systems (except as listed  in HPI above).  Objective  Vitals:   03/11/21 1332  BP: 122/68  Pulse: 93  Resp: 16  Temp: 97.9 F (36.6 C)  TempSrc: Oral  SpO2: 98%  Weight: 152 lb (68.9 kg)  Height: 5\' 10"  (1.778 m)    Body mass index is 21.81 kg/m.  Physical Exam  Constitutional: Patient appears well-developed and malnourished  No distress.  HEENT: head atraumatic, normocephalic, pupils equal and reactive to light,  neck supple Cardiovascular: Normal rate, regular rhythm and normal heart sounds.  No murmur heard. No BLE edema. Pulmonary/Chest: Effort normal and breath sounds normal. No respiratory distress. Abdominal: Soft.  There is no tenderness. Neurological: still has some left side hemiparesis.  Psychiatric: Patient has a normal mood and affect. behavior is normal. Judgment and thought content normal. Muscular skeletal: looks frail and using walker   Recent Results (from the past 2160 hour(s))  I-STAT creatinine     Status: None   Collection Time: 12/19/20 11:31 AM  Result Value Ref Range   Creatinine, Ser 1.20 0.61 - 1.24 mg/dL  Comprehensive metabolic panel     Status: Abnormal   Collection Time: 12/20/20 10:22 AM  Result Value Ref Range   Sodium 135 135 - 145 mmol/L   Potassium 4.0 3.5 - 5.1 mmol/L   Chloride 98 98 - 111 mmol/L   CO2 28 22 - 32 mmol/L   Glucose, Bld 87 70 - 99 mg/dL    Comment: Glucose reference range applies only to samples taken after fasting for at least 8 hours.   BUN 21 8 - 23 mg/dL   Creatinine, Ser 1.25 (H) 0.61 - 1.24 mg/dL   Calcium 8.8 (L) 8.9 - 10.3 mg/dL   Total Protein 6.3 (L) 6.5 - 8.1 g/dL   Albumin 3.4 (L) 3.5 - 5.0 g/dL   AST 15 15 - 41 U/L   ALT 9 0 - 44 U/L   Alkaline Phosphatase 55 38 - 126 U/L   Total Bilirubin 0.9 0.3 - 1.2 mg/dL   GFR, Estimated 54 (L) >60 mL/min    Comment: (NOTE) Calculated using the CKD-EPI Creatinine Equation (2021)    Anion gap 9 5 - 15    Comment: Performed at [facility], [address] [place], [place]  [id number]  CBC with Differential     Status: Abnormal   Collection Time: 12/20/20 10:22 AM  Result Value Ref Range   WBC  8.4 4.0 - 10.5 K/uL   RBC 4.13 (L) 4.22 - 5.81 MIL/uL   Hemoglobin 13.0 13.0 - 17.0 g/dL   HCT 39.2 39.0 - 52.0 %   MCV 94.9 80.0 - 100.0 fL   MCH 31.5 26.0 - 34.0 pg   MCHC 33.2 30.0 - 36.0 g/dL   RDW 14.6 11.5 - 15.5 %   Platelets 232 150 - 400 K/uL   nRBC 0.0 0.0 - 0.2 %   Neutrophils Relative % 62 %   Neutro Abs 5.2 1.7 - 7.7 K/uL   Lymphocytes Relative 22 %   Lymphs Abs 1.9 0.7 - 4.0 K/uL   Monocytes Relative 12 %   Monocytes Absolute 1.0 0.1 - 1.0 K/uL   Eosinophils Relative 3 %   Eosinophils Absolute 0.2 0.0 - 0.5 K/uL   Basophils Relative 1 %   Basophils Absolute 0.1 0.0 - 0.1 K/uL   Immature Granulocytes 0 %   Abs Immature Granulocytes 0.02 0.00 - 0.07 K/uL    Comment: Performed at [facility], [address] [place], [place] [address]     PHQ2/9: Depression screen [facility] 2/9 03/11/2021 09/07/2020 06/26/2020 05/02/2020 03/06/2020  Decreased Interest 0 0 0 0 0  Down, Depressed, Hopeless 0 0 0 0 0  PHQ - 2 Score 0 0 0 0 0  Altered sleeping - - 0 0 0  Tired, decreased energy - - 0 0 0  Change in appetite - - 0 0 0  Feeling bad or failure about yourself  - - 0 0 0  Trouble concentrating - - 0 0 0  Moving slowly or fidgety/restless - - 0 0 0  Suicidal thoughts - - 0 0 0  PHQ-9 Score - - 0 0 0  Difficult doing work/chores - - - - Not difficult at all  Some recent data might be hidden    phq 9 is negative   Fall Risk: Fall Risk  03/11/2021 09/07/2020 06/26/2020 05/02/2020 03/06/2020  Falls in the past year? 0 0 0 0 0  Number falls in past yr: 0 0 0 - 0  Injury with Fall? 0 0 0 - 0  Risk for fall due to : - - - Impaired balance/gait;Impaired mobility -  Follow up - - - - -     Functional Status Survey: Is the patient deaf or have difficulty hearing?: Yes Does the patient have difficulty seeing, even when wearing glasses/contacts?: No Does the  patient have difficulty concentrating, remembering, or making decisions?: Yes Does the patient have difficulty walking or climbing stairs?: Yes Does the patient have difficulty dressing or bathing?: Yes Does the patient have difficulty doing errands alone such as visiting a doctor's office or shopping?: Yes    Assessment & Plan  1. Moderate protein-calorie malnutrition ([place])  His weight is now stable  2. Hemiparesis affecting left side as late effect of cerebrovascular accident (CVA) ([place])  - Lipid panel  3. Peripheral vascular disease of lower extremity ([place])   4. Chronic atrial fibrillation, unspecified ([facility])  Rate controlled   5. Senile purpura ([facility])  Both arms   6. Atherosclerosis of aorta ([facility])  On statin therapy and eliquis   7. Stage 3a chronic kidney disease ([facility])  - CBC with Differential/Platelet - COMPLETE METABOLIC PANEL WITH GFR  8. Non-small cell carcinoma of lung, right ([place])  Finished radiation therapy   9. Physical deconditioning  Still having PT  10. Low serum vitamin B12  - Vitamin B12  11. Thoracic aortic  aneurysm without rupture ([facility])  CT of chest 4 cm, seen by vascular surgeon  12. History of cholecystitis  On Ursodiol   13. Hyperglycemia  - Hemoglobin A1c  14. Benign prostatic hyperplasia with nocturia  - finasteride (PROSCAR) 5 MG tablet; Take 1 tablet (5 mg total) by mouth daily.  Dispense: 90 tablet; Refill: 1

## 2021-03-12 ENCOUNTER — Encounter: Payer: Self-pay | Admitting: Family Medicine

## 2021-03-12 LAB — COMPLETE METABOLIC PANEL WITH GFR
AG Ratio: 1.7 (calc) (ref 1.0–2.5)
ALT: 4 U/L — ABNORMAL LOW (ref 9–46)
AST: 10 U/L (ref 10–35)
Albumin: 3.7 g/dL (ref 3.6–5.1)
Alkaline phosphatase (APISO): 59 U/L (ref 35–144)
BUN/Creatinine Ratio: 18 (calc) (ref 6–22)
BUN: 21 mg/dL (ref 7–25)
CO2: 30 mmol/L (ref 20–32)
Calcium: 9.3 mg/dL (ref 8.6–10.3)
Chloride: 106 mmol/L (ref 98–110)
Creat: 1.18 mg/dL — ABNORMAL HIGH (ref 0.70–1.11)
GFR, Est African American: 62 mL/min/{1.73_m2} (ref >=60)
GFR, Est Non African American: 53 mL/min/{1.73_m2} — ABNORMAL LOW (ref >=60)
Globulin: 2.2 g/dL (calc) (ref 1.9–3.7)
Glucose, Bld: 93 mg/dL (ref 65–99)
Potassium: 5.3 mmol/L (ref 3.5–5.3)
Sodium: 141 mmol/L (ref 135–146)
Total Bilirubin: 0.6 mg/dL (ref 0.2–1.2)
Total Protein: 5.9 g/dL — ABNORMAL LOW (ref 6.1–8.1)

## 2021-03-12 LAB — CBC WITH DIFFERENTIAL/PLATELET
Absolute Monocytes: 1080 cells/uL — ABNORMAL HIGH (ref 200–950)
Basophils Absolute: 112 cells/uL (ref 0–200)
Basophils Relative: 1.4 %
Eosinophils Absolute: 320 cells/uL (ref 15–500)
Eosinophils Relative: 4 %
HCT: 40.2 % (ref 38.5–50.0)
Hemoglobin: 12.5 g/dL — ABNORMAL LOW (ref 13.2–17.1)
Lymphs Abs: 1920 cells/uL (ref 850–3900)
MCH: 29.1 pg (ref 27.0–33.0)
MCHC: 31.1 g/dL — ABNORMAL LOW (ref 32.0–36.0)
MCV: 93.5 fL (ref 80.0–100.0)
MPV: 11.1 fL (ref 7.5–12.5)
Monocytes Relative: 13.5 %
Neutro Abs: 4568 cells/uL (ref 1500–7800)
Neutrophils Relative %: 57.1 %
Platelets: 308 10*3/uL (ref 140–400)
RBC: 4.3 10*6/uL (ref 4.20–5.80)
RDW: 12.1 % (ref 11.0–15.0)
Total Lymphocyte: 24 %
WBC: 8 10*3/uL (ref 3.8–10.8)

## 2021-03-12 LAB — LIPID PANEL
Cholesterol: 98 mg/dL (ref <200)
HDL: 35 mg/dL — ABNORMAL LOW (ref >=40)
LDL Cholesterol (Calc): 39 mg/dL (calc)
Non-HDL Cholesterol (Calc): 63 mg/dL (calc) (ref <130)
Total CHOL/HDL Ratio: 2.8 (calc) (ref <5.0)
Triglycerides: 153 mg/dL — ABNORMAL HIGH (ref <150)

## 2021-03-12 LAB — HEMOGLOBIN A1C
Hgb A1c MFr Bld: 5.5 % of total Hgb (ref <5.7)
Mean Plasma Glucose: 111 mg/dL
eAG (mmol/L): 6.2 mmol/L

## 2021-03-12 LAB — VITAMIN B12: Vitamin B-12: 310 pg/mL (ref 200–1100)

## 2021-03-13 ENCOUNTER — Ambulatory Visit: Payer: Medicare Other

## 2021-03-13 ENCOUNTER — Other Ambulatory Visit: Payer: Self-pay

## 2021-03-13 DIAGNOSIS — R269 Unspecified abnormalities of gait and mobility: Secondary | ICD-10-CM

## 2021-03-13 DIAGNOSIS — R262 Difficulty in walking, not elsewhere classified: Secondary | ICD-10-CM

## 2021-03-13 DIAGNOSIS — M6281 Muscle weakness (generalized): Secondary | ICD-10-CM | POA: Diagnosis not present

## 2021-03-13 DIAGNOSIS — R2689 Other abnormalities of gait and mobility: Secondary | ICD-10-CM

## 2021-03-13 NOTE — Therapy (Signed)
[place] MAIN [facility] SERVICES [address] [place], [place], [address] Phone: [phone]   Fax:  [phone]  Physical Therapy Treatment  Patient Details  Name: Jose Castro MRN: [id number] Date of Birth: [birth year] Referring Provider (PT): Dr. Ancil Boozer   Encounter Date: 03/13/2021   PT End of Session - 03/13/21 0914    Visit Number 38    Number of Visits 19    Date for PT Re-Evaluation 05/13/21    Authorization Type eval: 09/20/20, Progress note 01/02/2021, PN on 05/08/6719, Recert = [id number]    PT Start Time 1346    PT Stop Time 1429    PT Time Calculation (min) 43 min    Equipment Utilized During Treatment Gait belt    Activity Tolerance Patient tolerated treatment well    Behavior During Therapy [facility] for tasks assessed/performed           Past Medical History:  Diagnosis Date  . Allergy    Penicillin  . Arthritis   . Basal cell carcinoma   . BPH (benign prostatic hyperplasia)   . Chronic kidney disease    had a kidney stone which per family was a cyst that was removed  . Dysrhythmia   . GERD (gastroesophageal reflux disease)   . History of kidney stones   . Hyperlipidemia   . Hypertension   . Left-sided low back pain with left-sided sciatica   . Lung mass   . Splenic vein thrombosis   . Stroke ([place]) 05/22/2019  . Thrombosis 12/2015   mural  area and no notation of heart attack    Past Surgical History:  Procedure Laterality Date  . APPENDECTOMY    . CATARACT EXTRACTION, BILATERAL    . CYSTOSCOPY WITH INSERTION OF UROLIFT    . ENDOSCOPIC RETROGRADE CHOLANGIOPANCREATOGRAPHY (ERCP) WITH PROPOFOL N/A 05/11/2020   Procedure: ENDOSCOPIC RETROGRADE CHOLANGIOPANCREATOGRAPHY (ERCP) WITH PROPOFOL;  Surgeon: [name], MD;  Location: [facility] ENDOSCOPY;  Service: Endoscopy;  Laterality: N/A;  . ERCP N/A 10/23/2020   Procedure: ENDOSCOPIC RETROGRADE CHOLANGIOPANCREATOGRAPHY (ERCP);  Surgeon: [name], MD;  Location: [facility]  ENDOSCOPY;  Service: Endoscopy;  Laterality: N/A;  . EYE SURGERY  08/2018  . IR EXCHANGE BILIARY DRAIN  05/04/2020  . KIDNEY STONE SURGERY    . KNEE SURGERY    . PACEMAKER INSERTION N/A 07/13/2018   Procedure: INSERTION PACEMAKER-DUEL CHAMBER INITIAL IMPLANT;  Surgeon: Isaias Cowman, MD;  Location: [facility] ORS;  Service: Cardiovascular;  Laterality: N/A;  . SPINE SURGERY      There were no vitals filed for this visit.   Subjective Assessment - 03/13/21 1353    Subjective Patient reports he is doing pretty well today- States his knee is not as painful today but describes pain as "it hurts but not really painful- more annoying than anything.    Patient is accompained by: Family member    Pertinent History Pt referred for deconditioning and gait instability. He has had a decline since he last finished therapy. Pt had a hospital admission from 05/02/20 to 05/14/20 for acute cholecystitis and is now s/p cholecystostomy drain placement. Drain is still in place and pt is waiting to hear when they plan to remove it. Pt discharged to SNF and then returned home. In addition pt underwent radiation therapy for a RUL lung mass/cancer and had his last radiation treatment in September of 2021. He reports that he has continued to walk at home as his primary exercise. Pt was previously seen at this clinic  for PT/OT due to weakness/imbalance s/p CVA. Prior history from 09/21/19: Pt is a 87 year old male who presents with imbalance and difficulty with gait following a right frontal CVA with left hemiparesis on 05/22/19.  He has completed inpatient and home health PT, and now presents for OP PT.  He was discharged from inpatient rehab at a supervision level of assistance, ambulating with a RW.  His family notes that he is impulsive and will try to ambulate without his RW at home.  Family is currently providing 24/7 supervision.    Limitations House hold activities;Walking    How long can you sit comfortably? no limitations     How long can you walk comfortably? he requires a RW    Patient Stated Goals Pt wants to be able to walk his granddaughter down the aisle at her wedding November 2022    Currently in Pain? Yes    Pain Score --   "my left knee hurts but not really painful."   Pain Location Knee    Pain Onset More than a month ago            Nustep LE only L2 for 5 min= 0.19 miles. Patient rates as "easy"    Step tapping (alt LEs) using 3lb. AW  onto 10 in block with BUE Support 2 sets of 10 reps. Patient rates as "hard" - able to complete well with good form.  Obstacle course: Using 4WW -ambulated up and over purple pad- wide step (1 foot on each side of 1/2 white foam then black bolster to enforce widened step) the around 4 cones finishing with locking 4WW and turning to sit. 4 trials 1) 1 min 5 sec 2) 60 sec 3) 50 sec and 4) 42 sec   Standing at wall- lumbar flex/ext- x 10 reps with VC to try to touch scapula to wall.   Sit to stand - No UE support as patient was holding onto a basketball and instructed to stand and go through shooting motion for posture and balance- 10 reps without UE support.   Seated basketball toss to PT focusing on sitting posture and dual tasking- chest pass and bounce pass with good coordination today.   Clinical Impression: Patient performed well overall with obstacle course- improving with each trial and exhibiting no loss of balance only fatigue without complaint of any left knee pain with sit to stand or balance activities. Pt will benefit from further skilled PT to improve BLE strength, balance and functional mobility                     PT Education - 03/14/21 0913    Education Details specific cues for posture/balance    Person(s) Educated Patient    Methods Explanation;Demonstration;Tactile cues;Verbal cues    Comprehension Verbalized understanding;Returned demonstration;Verbal cues required;Tactile cues required;Need further instruction             PT Short Term Goals - 01/21/21 1438      PT SHORT TERM GOAL #1   Title Pt will be independent with HEP in order to improve strength and balance in order to decrease fall risk and improve function at home.    Baseline 01/02/2021- Patient verbalize understanding of walking and seated exercises for home program. 01/21/21 patient has been more active and understands HEP.    Time 6    Period Weeks    Status Achieved    Target Date 01/10/21  PT Long Term Goals - 02/18/21 1403      PT LONG TERM GOAL #1   Title Pt will improve BERG by at least 3 points in order to demonstrate clinically significant improvement in balance.    Baseline 09/20/20: 32/56 (Previous discharge: 03/06/20: 46/56), 12/14: 32/56, 1/13: 31/56, 01/16/21 35/56, 02/06/2021=36/56. 02/18/2021= 37/56    Time 12    Period Weeks    Status On-going    Target Date 05/13/21      PT LONG TERM GOAL #2   Title Pt will decrease TUG to below 14 seconds/decrease in order to demonstrate decreased fall risk.    Baseline 09/20/20: 19.2s, 12/14: 17.48, 1/13: 16.65 sec. 01/02/2021=15.75 sec using 4WW, 01/21/21  14.37 seconds. 02/06/2021= 14.92 sec using 4WW. 02/18/2021= 14.5 sec using 4WW.    Time 12    Period Weeks    Status On-going    Target Date 05/13/21      PT LONG TERM GOAL #3   Title Pt will decrease 5TSTS by at least 3 seconds in order to demonstrate clinically significant improvement in LE strength.    Baseline 09/20/20: 23.1s with minA+1 for strength and balance (Previous discharge 03/06/20: 16.1s), 12/14: 20 sec, 1/13: 22 sec.   01/02/2021= 16.05 sec with light UE support. 01/21/21 01/21/21 37.90 seconds without UE, with UE support 12.93 seconds: 02/06/2021= with 1 UE 17.93 sec. 02/18/2021= 21.33 sec without UE support.    Time 12    Period Weeks    Status On-going    Target Date 05/13/21      PT LONG TERM GOAL #4   Title Pt will increase his self-selected 10MWT to >0.74 m/s in order to demonstrate improvement in gait speed  and improved community ambulation    Baseline 09/20/20: self-selected: 14.4s = 0.69 m/s with rollator (Previous discharge 03/06/20: self-selected: 13.6s = 0.74 m/s), 12/14: 0.75 m/s, 1/13: 0.78 m/s. 01/02/2021= 0.85 m/s using 4WW, 01/16/21 with 4 WW .99 m/s. 02/06/2021= self selected = 0.93 m/s with 8MV    Time 4    Period Weeks    Status Achieved      PT LONG TERM GOAL #5   Title Patient will increase six minute walk test distance to >1000 for progression to community ambulator and improve gait ability    Baseline 01/10/20  620 ft with rollator, 01/21/21  965 ft; 02/06/2021= 910 feet with 4WW.  02/18/2021= 950 feet with 4WW    Time 12    Period Weeks    Status On-going    Target Date 05/13/21                 Plan - 03/13/21 0915    Clinical Impression Statement Patient performed well overall with obstacle course- improving with each trial and exhibiting no loss of balance only fatigue without complaint of any left knee pain with sit to stand or balance activities. Pt will benefit from further skilled PT to improve BLE strength, balance and functional mobility    Personal Factors and Comorbidities Age;Comorbidity 3+    Comorbidities HTN, CVA, cholecystitis with cholecystostomy drain    Examination-Activity Limitations Bend;Lift;Squat;Locomotion Level;Stairs;Stand    Examination-Participation Restrictions Community Activity;Driving;Laundry;Meal Prep;Shop    Stability/Clinical Decision Making Unstable/Unpredictable    Rehab Potential Fair    PT Frequency 2x / week    PT Duration 4 weeks    PT Treatment/Interventions ADLs/Self Care Home Management;Canalith Repostioning;Cryotherapy;Electrical Stimulation;Iontophoresis 4mg /ml Dexamethasone;Moist Heat;Traction;Ultrasound;DME Instruction;Gait training;Stair training;Therapeutic activities;Therapeutic exercise;Functional mobility training;Balance training;Neuromuscular re-education;Patient/family education;Manual techniques;Dry  needling;Vestibular;Joint Manipulations;Spinal  Manipulations;Aquatic Therapy    PT Next Visit Plan Continue to focus on LE strengthening and balance, dynamic balance tasks    Consulted and Agree with Plan of Care Patient;Family member/caregiver           Patient will benefit from skilled therapeutic intervention in order to improve the following deficits and impairments:  Abnormal gait,Decreased balance,Decreased mobility,Difficulty walking,Decreased knowledge of precautions,Decreased safety awareness,Decreased activity tolerance,Decreased strength,Decreased endurance  Visit Diagnosis: Abnormality of gait and mobility  Difficulty in walking, not elsewhere classified  Muscle weakness (generalized)  Other abnormalities of gait and mobility     Problem List Patient Active Problem List   Diagnosis Date Noted  . Moderate protein-calorie malnutrition ([place]) 03/11/2021  . Hemiparesis affecting left side as late effect of cerebrovascular accident (CVA) ([place]) 03/11/2021  . Aneurysm, aorta, thoracic ([place]) 12/21/2020  . Calculus of bile duct without cholecystitis and without obstruction   . Non-small cell carcinoma of lung, right ([place]) 09/07/2020  . Mass of upper lobe of right lung 07/13/2020  . Calculus of bile duct with cholecystitis without obstruction   . Abnormal findings on imaging of biliary tract   . Acute cholecystitis 05/02/2020  . Chronic venous insufficiency 12/05/2019  . 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 CKD (chronic kidney disease), stage II   . New onset atrial fibrillation ([place])   . 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 Goals of care, counseling/discussion   . Palliative care by specialist   . DNR (do not resuscitate) discussion   . History of CVA (cerebrovascular accident) 05/22/2019  . Persistent proteinuria 07/30/2018  . 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 Chronic kidney disease, stage III (moderate) ([place])  06/15/2018  . Leg pain 05/06/2018  . Lymphedema 05/06/2018  . Coagulopathy ([place]) 07/07/2017  . Lumbar spondylosis 03/10/2017  . Elevated uric acid in blood 11/20/2016  . BPH (benign prostatic hyperplasia) 05/29/2016  . Peripheral vascular disease of lower extremity ([place]) 03/03/2016  . Gallstone 12/31/2015  . 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 Atherosclerosis of aorta ([place]) 11/22/2015  . Carotid artery narrowing 11/22/2015  . Diverticulosis of colon 11/22/2015  . Decreased creatinine clearance 11/22/2015  . Arthritis, degenerative 11/22/2015  . Lactose intolerance 11/22/2015  . Basal cell carcinoma 11/22/2015  . Essential hypertension 06/21/2015  . Hyperlipemia 06/21/2015  . GERD (gastroesophageal reflux disease) 06/21/2015  . Calculus of kidney 11/18/2013    Lewis Moccasin, PT 03/14/2021, 9:23 AM  [place] MAIN [facility] SERVICES [address] [place], [place], [address] Phone: [phone]   Fax:  [phone]  Name: Jose Castro MRN: [id number] Date of Birth: 1926/10/11

## 2021-03-18 ENCOUNTER — Other Ambulatory Visit: Payer: Self-pay

## 2021-03-18 ENCOUNTER — Ambulatory Visit: Payer: Medicare Other

## 2021-03-18 DIAGNOSIS — R262 Difficulty in walking, not elsewhere classified: Secondary | ICD-10-CM

## 2021-03-18 DIAGNOSIS — R269 Unspecified abnormalities of gait and mobility: Secondary | ICD-10-CM

## 2021-03-18 DIAGNOSIS — M6281 Muscle weakness (generalized): Secondary | ICD-10-CM | POA: Diagnosis not present

## 2021-03-18 DIAGNOSIS — R2689 Other abnormalities of gait and mobility: Secondary | ICD-10-CM

## 2021-03-18 NOTE — Therapy (Signed)
[place] MAIN [facility] SERVICES [address] [place], [place], [address] Phone: [phone]   Fax:  [phone]  Physical Therapy Treatment  Patient Details  Name: Jose Castro MRN: [id number] Date of Birth: May 24, 1926 Referring Provider (PT): Dr. Ancil Boozer   Encounter Date: 03/18/2021   PT End of Session - 03/18/21 1346    Visit Number 39    Number of Visits 24    Date for PT Re-Evaluation 05/13/21    Authorization Type eval: 09/20/20, Progress note 01/02/2021, PN on 08/08/9891, Recert = 12/19/4172-0/81/4481    PT Start Time 1343    PT Stop Time 1429    PT Time Calculation (min) 46 min    Equipment Utilized During Treatment Gait belt    Activity Tolerance Patient tolerated treatment well    Behavior During Therapy [facility] for tasks assessed/performed           Past Medical History:  Diagnosis Date  . Allergy    Penicillin  . Arthritis   . Basal cell carcinoma   . BPH (benign prostatic hyperplasia)   . Chronic kidney disease    had a kidney stone which per family was a cyst that was removed  . Dysrhythmia   . GERD (gastroesophageal reflux disease)   . History of kidney stones   . Hyperlipidemia   . Hypertension   . Left-sided low back pain with left-sided sciatica   . Lung mass   . Splenic vein thrombosis   . Stroke ([place]) 05/22/2019  . Thrombosis 12/2015   mural  area and no notation of heart attack    Past Surgical History:  Procedure Laterality Date  . APPENDECTOMY    . CATARACT EXTRACTION, BILATERAL    . CYSTOSCOPY WITH INSERTION OF UROLIFT    . ENDOSCOPIC RETROGRADE CHOLANGIOPANCREATOGRAPHY (ERCP) WITH PROPOFOL N/A 05/11/2020   Procedure: ENDOSCOPIC RETROGRADE CHOLANGIOPANCREATOGRAPHY (ERCP) WITH PROPOFOL;  Surgeon: [name], MD;  Location: [facility] ENDOSCOPY;  Service: Endoscopy;  Laterality: N/A;  . ERCP N/A 10/23/2020   Procedure: ENDOSCOPIC RETROGRADE CHOLANGIOPANCREATOGRAPHY (ERCP);  Surgeon: [name], MD;  Location: [facility]  ENDOSCOPY;  Service: Endoscopy;  Laterality: N/A;  . EYE SURGERY  08/2018  . IR EXCHANGE BILIARY DRAIN  05/04/2020  . KIDNEY STONE SURGERY    . KNEE SURGERY    . PACEMAKER INSERTION N/A 07/13/2018   Procedure: INSERTION PACEMAKER-DUEL CHAMBER INITIAL IMPLANT;  Surgeon: Isaias Cowman, MD;  Location: [facility] ORS;  Service: Cardiovascular;  Laterality: N/A;  . SPINE SURGERY      There were no vitals filed for this visit.   Subjective Assessment - 03/18/21 1345    Subjective Patient reports no falls or LOB since last session. Had a good easter.    Patient is accompained by: Family member    Pertinent History Pt referred for deconditioning and gait instability. He has had a decline since he last finished therapy. Pt had a hospital admission from 05/02/20 to 05/14/20 for acute cholecystitis and is now s/p cholecystostomy drain placement. Drain is still in place and pt is waiting to hear when they plan to remove it. Pt discharged to SNF and then returned home. In addition pt underwent radiation therapy for a RUL lung mass/cancer and had his last radiation treatment in September of 2021. He reports that he has continued to walk at home as his primary exercise. Pt was previously seen at this clinic for PT/OT due to weakness/imbalance s/p CVA. Prior history from 09/21/19: Pt is a 87 year old male who  presents with imbalance and difficulty with gait following a right frontal CVA with left hemiparesis on 05/22/19.  He has completed inpatient and home health PT, and now presents for OP PT.  He was discharged from inpatient rehab at a supervision level of assistance, ambulating with a RW.  His family notes that he is impulsive and will try to ambulate without his RW at home.  Family is currently providing 24/7 supervision.    Limitations House hold activities;Walking    How long can you sit comfortably? no limitations    How long can you walk comfortably? he requires a RW    Patient Stated Goals Pt wants to be able  to walk his granddaughter down the aisle at her wedding November 2022    Currently in Pain? No/denies              Nustep LE only L2 for 4 min for cardiovascular and musculoskeletal challenge ; cues for RPM> 70   Standing in // bars: close CGA  3lb ankle weight:  -6" step toe taps BUE support 12x each LE -6" step lateral toe tap BUE support 10x  -forward/backwards marching  4x in // bars   -lateral stepping 4x length of // bars with BUE support   airex pad: static stand while reaching outside BOS to grab a ball and throw at hoop x 15 balls   Modified forward lunge on bosu ball 10x each LE  Step over two consecutive half foam rollers 10x each length.   Speed ladder: one foot each square for foot placement x 8 lengths of // bars   Sit to stand -10x with BUE support   Seated: balloon taps reaching inside/outside BOS x2 minutes      Clinical Impression: Patient performed well overall with obstacle course- improving with each trial and exhibiting no loss of balance only fatigue without complaint of any left knee pain with sit to stand or balance activities. Pt will benefit from further skilled PT to improve BLE strength, balance and functional mobility     Patient remains highly motivated throughout physical therapy session. He requires intermittent cueing for sequencing and stability for safety awareness throughout session but improves with repetition. His LLE fatigues quicker than his RLE at this time. . Pt will benefit from further skilled PT to improve BLE strength, balance and functional mobility                     PT Education - 03/18/21 1346    Education Details exercise technique, body mechanics    Person(s) Educated Patient    Methods Explanation;Demonstration;Tactile cues;Verbal cues    Comprehension Verbalized understanding;Returned demonstration;Verbal cues required;Tactile cues required            PT Short Term Goals - 01/21/21 1438      PT  SHORT TERM GOAL #1   Title Pt will be independent with HEP in order to improve strength and balance in order to decrease fall risk and improve function at home.    Baseline 01/02/2021- Patient verbalize understanding of walking and seated exercises for home program. 01/21/21 patient has been more active and understands HEP.    Time 6    Period Weeks    Status Achieved    Target Date 01/10/21             PT Long Term Goals - 02/18/21 1403      PT LONG TERM GOAL #1   Title Pt will improve  BERG by at least 3 points in order to demonstrate clinically significant improvement in balance.    Baseline 09/20/20: 32/56 (Previous discharge: 03/06/20: 46/56), 12/14: 32/56, 1/13: 31/56, 01/16/21 35/56, 02/06/2021=36/56. 02/18/2021= 37/56    Time 12    Period Weeks    Status On-going    Target Date 05/13/21      PT LONG TERM GOAL #2   Title Pt will decrease TUG to below 14 seconds/decrease in order to demonstrate decreased fall risk.    Baseline 09/20/20: 19.2s, 12/14: 17.48, 1/13: 16.65 sec. 01/02/2021=15.75 sec using 4WW, 01/21/21  14.37 seconds. 02/06/2021= 14.92 sec using 4WW. 02/18/2021= 14.5 sec using 4WW.    Time 12    Period Weeks    Status On-going    Target Date 05/13/21      PT LONG TERM GOAL #3   Title Pt will decrease 5TSTS by at least 3 seconds in order to demonstrate clinically significant improvement in LE strength.    Baseline 09/20/20: 23.1s with minA+1 for strength and balance (Previous discharge 03/06/20: 16.1s), 12/14: 20 sec, 1/13: 22 sec.   01/02/2021= 16.05 sec with light UE support. 01/21/21 01/21/21 37.90 seconds without UE, with UE support 12.93 seconds: 02/06/2021= with 1 UE 17.93 sec. 02/18/2021= 21.33 sec without UE support.    Time 12    Period Weeks    Status On-going    Target Date 05/13/21      PT LONG TERM GOAL #4   Title Pt will increase his self-selected 10MWT to >0.74 m/s in order to demonstrate improvement in gait speed and improved community ambulation    Baseline  09/20/20: self-selected: 14.4s = 0.69 m/s with rollator (Previous discharge 03/06/20: self-selected: 13.6s = 0.74 m/s), 12/14: 0.75 m/s, 1/13: 0.78 m/s. 01/02/2021= 0.85 m/s using 4WW, 01/16/21 with 4 WW .99 m/s. 02/06/2021= self selected = 0.93 m/s with 3ZJ    Time 4    Period Weeks    Status Achieved      PT LONG TERM GOAL #5   Title Patient will increase six minute walk test distance to >1000 for progression to community ambulator and improve gait ability    Baseline 01/10/20  620 ft with rollator, 01/21/21  965 ft; 02/06/2021= 910 feet with 4WW.  02/18/2021= 950 feet with 4WW    Time 12    Period Weeks    Status On-going    Target Date 05/13/21                 Plan - 03/18/21 1437    Clinical Impression Statement Patient remains highly motivated throughout physical therapy session. He requires intermittent cueing for sequencing and stability for safety awareness throughout session but improves with repetition. His LLE fatigues quicker than his RLE at this time. . Pt will benefit from further skilled PT to improve BLE strength, balance and functional mobility    Personal Factors and Comorbidities Age;Comorbidity 3+    Comorbidities HTN, CVA, cholecystitis with cholecystostomy drain    Examination-Activity Limitations Bend;Lift;Squat;Locomotion Level;Stairs;Stand    Examination-Participation Restrictions Community Activity;Driving;Laundry;Meal Prep;Shop    Stability/Clinical Decision Making Unstable/Unpredictable    Rehab Potential Fair    PT Frequency 2x / week    PT Duration 4 weeks    PT Treatment/Interventions ADLs/Self Care Home Management;Canalith Repostioning;Cryotherapy;Electrical Stimulation;Iontophoresis 4mg /ml Dexamethasone;Moist Heat;Traction;Ultrasound;DME Instruction;Gait training;Stair training;Therapeutic activities;Therapeutic exercise;Functional mobility training;Balance training;Neuromuscular re-education;Patient/family education;Manual techniques;Dry  needling;Vestibular;Joint Manipulations;Spinal Manipulations;Aquatic Therapy    PT Next Visit Plan Continue to focus on LE strengthening and balance, dynamic balance tasks  Consulted and Agree with Plan of Care Patient;Family member/caregiver           Patient will benefit from skilled therapeutic intervention in order to improve the following deficits and impairments:  Abnormal gait,Decreased balance,Decreased mobility,Difficulty walking,Decreased knowledge of precautions,Decreased safety awareness,Decreased activity tolerance,Decreased strength,Decreased endurance  Visit Diagnosis: Abnormality of gait and mobility  Difficulty in walking, not elsewhere classified  Muscle weakness (generalized)  Other abnormalities of gait and mobility     Problem List Patient Active Problem List   Diagnosis Date Noted  . Moderate protein-calorie malnutrition ([place]) 03/11/2021  . Hemiparesis affecting left side as late effect of cerebrovascular accident (CVA) ([place]) 03/11/2021  . Aneurysm, aorta, thoracic ([place]) 12/21/2020  . Calculus of bile duct without cholecystitis and without obstruction   . Non-small cell carcinoma of lung, right ([place]) 09/07/2020  . Mass of upper lobe of right lung 07/13/2020  . Calculus of bile duct with cholecystitis without obstruction   . Abnormal findings on imaging of biliary tract   . Acute cholecystitis 05/02/2020  . Chronic venous insufficiency 12/05/2019  . 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 CKD (chronic kidney disease), stage II   . New onset atrial fibrillation ([place])   . 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 Goals of care, counseling/discussion   . Palliative care by specialist   . DNR (do not resuscitate) discussion   . History of CVA (cerebrovascular accident) 05/22/2019  . Persistent proteinuria 07/30/2018  . 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 Chronic kidney disease, stage III (moderate) ([place])  06/15/2018  . Leg pain 05/06/2018  . Lymphedema 05/06/2018  . Coagulopathy ([place]) 07/07/2017  . Lumbar spondylosis 03/10/2017  . Elevated uric acid in blood 11/20/2016  . BPH (benign prostatic hyperplasia) 05/29/2016  . Peripheral vascular disease of lower extremity ([place]) 03/03/2016  . Gallstone 12/31/2015  . 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 Atherosclerosis of aorta ([place]) 11/22/2015  . Carotid artery narrowing 11/22/2015  . Diverticulosis of colon 11/22/2015  . Decreased creatinine clearance 11/22/2015  . Arthritis, degenerative 11/22/2015  . Lactose intolerance 11/22/2015  . Basal cell carcinoma 11/22/2015  . Essential hypertension 06/21/2015  . Hyperlipemia 06/21/2015  . GERD (gastroesophageal reflux disease) 06/21/2015  . Calculus of kidney 11/18/2013   Janna Arch, PT, DPT   03/18/2021, 2:38 PM  [place] MAIN [facility] SERVICES [address] [place], [place], [address] Phone: [phone]   Fax:  [phone]  Name: Jose Castro MRN: [id number] Date of Birth: 01-28-1926

## 2021-03-20 ENCOUNTER — Ambulatory Visit: Payer: Medicare Other

## 2021-03-20 ENCOUNTER — Other Ambulatory Visit: Payer: Self-pay

## 2021-03-20 DIAGNOSIS — R2681 Unsteadiness on feet: Secondary | ICD-10-CM

## 2021-03-20 DIAGNOSIS — R269 Unspecified abnormalities of gait and mobility: Secondary | ICD-10-CM

## 2021-03-20 DIAGNOSIS — M6281 Muscle weakness (generalized): Secondary | ICD-10-CM | POA: Diagnosis not present

## 2021-03-20 DIAGNOSIS — R262 Difficulty in walking, not elsewhere classified: Secondary | ICD-10-CM

## 2021-03-20 NOTE — Therapy (Signed)
[place] MAIN [facility] SERVICES [address] [place], [place], [address] Phone: [phone]   Fax:  [phone]  Physical Therapy Treatment/Physical Therapy Progress Note   Dates of reporting period  02/06/2021  to   03/20/2021  Patient Details  Name: Jose Castro MRN: [id number] Date of Birth: 1926-09-11 Referring Provider (PT): Dr. Ancil Boozer   Encounter Date: 03/20/2021   PT End of Session - 03/20/21 1435    Visit Number 20    Number of Visits 73    Date for PT Re-Evaluation 05/13/21    Authorization Type eval: 09/20/20, Progress note 01/02/2021, PN on 01/09/9241, Recert = 6/83/4196-01/22/9797; PN on 4/202022    PT Start Time 1346    PT Stop Time 1428    PT Time Calculation (min) 42 min    Equipment Utilized During Treatment Gait belt    Activity Tolerance Patient tolerated treatment well    Behavior During Therapy WFL for tasks assessed/performed           Past Medical History:  Diagnosis Date  . Allergy    Penicillin  . Arthritis   . Basal cell carcinoma   . BPH (benign prostatic hyperplasia)   . Chronic kidney disease    had a kidney stone which per family was a cyst that was removed  . Dysrhythmia   . GERD (gastroesophageal reflux disease)   . History of kidney stones   . Hyperlipidemia   . Hypertension   . Left-sided low back pain with left-sided sciatica   . Lung mass   . Splenic vein thrombosis   . Stroke ([place]) 05/22/2019  . Thrombosis 12/2015   mural  area and no notation of heart attack    Past Surgical History:  Procedure Laterality Date  . APPENDECTOMY    . CATARACT EXTRACTION, BILATERAL    . CYSTOSCOPY WITH INSERTION OF UROLIFT    . ENDOSCOPIC RETROGRADE CHOLANGIOPANCREATOGRAPHY (ERCP) WITH PROPOFOL N/A 05/11/2020   Procedure: ENDOSCOPIC RETROGRADE CHOLANGIOPANCREATOGRAPHY (ERCP) WITH PROPOFOL;  Surgeon: [name], MD;  Location: [facility] ENDOSCOPY;  Service: Endoscopy;  Laterality: N/A;  . ERCP N/A 10/23/2020    Procedure: ENDOSCOPIC RETROGRADE CHOLANGIOPANCREATOGRAPHY (ERCP);  Surgeon: [name], MD;  Location: [facility] ENDOSCOPY;  Service: Endoscopy;  Laterality: N/A;  . EYE SURGERY  08/2018  . IR EXCHANGE BILIARY DRAIN  05/04/2020  . KIDNEY STONE SURGERY    . KNEE SURGERY    . PACEMAKER INSERTION N/A 07/13/2018   Procedure: INSERTION PACEMAKER-DUEL CHAMBER INITIAL IMPLANT;  Surgeon: Isaias Cowman, MD;  Location: [facility] ORS;  Service: Cardiovascular;  Laterality: N/A;  . SPINE SURGERY      There were no vitals filed for this visit.   Subjective Assessment - 03/20/21 1124    Subjective Patient reports doing okay - Left knee is achy but doing fine otherwise.    Patient is accompained by: Family member    Pertinent History Pt referred for deconditioning and gait instability. He has had a decline since he last finished therapy. Pt had a hospital admission from 05/02/20 to 05/14/20 for acute cholecystitis and is now s/p cholecystostomy drain placement. Drain is still in place and pt is waiting to hear when they plan to remove it. Pt discharged to SNF and then returned home. In addition pt underwent radiation therapy for a RUL lung mass/cancer and had his last radiation treatment in September of 2021. He reports that he has continued to walk at home as his primary exercise. Pt was previously seen at  this clinic for PT/OT due to weakness/imbalance s/p CVA. Prior history from 09/21/19: Pt is a 87 year old male who presents with imbalance and difficulty with gait following a right frontal CVA with left hemiparesis on 05/22/19.  He has completed inpatient and home health PT, and now presents for OP PT.  He was discharged from inpatient rehab at a supervision level of assistance, ambulating with a RW.  His family notes that he is impulsive and will try to ambulate without his RW at home.  Family is currently providing 24/7 supervision.    Limitations House hold activities;Walking    How long can you sit comfortably? no  limitations    How long can you walk comfortably? he requires a RW    Patient Stated Goals Pt wants to be able to walk his granddaughter down the aisle at her wedding November 2022    Currently in Pain? Yes    Pain Score 4     Pain Location Knee    Pain Orientation Left    Pain Descriptors / Indicators Aching    Pain Type Chronic pain                      Standing in // bars: close CGA and instructed to perform with none/minimal left knee pain. 3lb ankle weight:  -6" step toe taps BUE support 12x each LE -6" step lateral toe tap BUE support 10x  -6" step lateral step up BUE support 5x (stopped due to left knee pain)  -forward/backwards marching  4x in // bars   -lateral stepping 4x length of // bars with BUE support   - Static stand in 1/2 white foam roll (curved side down) x 30 sec x 2 trials- initially B UE support, Improved to 1UE light touch on second trial  Modified forward lunge on bosu ball 10x each LE  Step over two consecutive half foam rollers 10x each length.   Static stand on BOSU ball (ball end up) - 20 sec x 3 trials without difficulty. Static stand on BOSU ball (ball end down) - 10-23 sec without UE support yet CGA with min assist at times to recover balance.   Education provided throughout session via VC/TC and demonstration to facilitate movement at target joints and correct muscle activation for all testing and exercises performed.  Clinical Impression: Patient challenged with balance exercises including addition of BOSU ball and white foam roll. He was able to perform all therex well and improved with practice with each activity today. No reported increased in left knee except for lunges- able to modify- partial lunge without pain.  Deferred goal testing this visit and will reassess next visit however Patient's condition has the potential to improve in response to therapy. Maximum improvement is yet to be obtained. The anticipated improvement is  attainable and reasonable in a generally predictable time.  Patient reports Pt will benefit from further skilled PT to improve BLE strength, balance and functional mobility                   PT Education - 03/21/21 1125    Education Details specific exercise technique.    Person(s) Educated Patient    Methods Explanation;Demonstration;Tactile cues;Verbal cues    Comprehension Verbalized understanding;Returned demonstration;Verbal cues required;Tactile cues required;Need further instruction            PT Short Term Goals - 01/21/21 1438      PT SHORT TERM GOAL #1   Title Pt  will be independent with HEP in order to improve strength and balance in order to decrease fall risk and improve function at home.    Baseline 01/02/2021- Patient verbalize understanding of walking and seated exercises for home program. 01/21/21 patient has been more active and understands HEP.    Time 6    Period Weeks    Status Achieved    Target Date 01/10/21             PT Long Term Goals - 03/20/21 1136      PT LONG TERM GOAL #1   Title Pt will improve BERG by at least 3 points in order to demonstrate clinically significant improvement in balance.    Baseline 09/20/20: 32/56 (Previous discharge: 03/06/20: 46/56), 12/14: 32/56, 1/13: 31/56, 01/16/21 35/56, 02/06/2021=36/56. 02/18/2021= 37/56.03/20/2021- Deferred secondary to left knee aching- will attempt next visit.    Time 12    Period Weeks    Status On-going    Target Date 05/13/21      PT LONG TERM GOAL #2   Title Pt will decrease TUG to below 14 seconds/decrease in order to demonstrate decreased fall risk.    Baseline 09/20/20: 19.2s, 12/14: 17.48, 1/13: 16.65 sec. 01/02/2021=15.75 sec using 4WW, 01/21/21  14.37 seconds. 02/06/2021= 14.92 sec using 4WW. 02/18/2021= 14.5 sec using 4WW. 03/20/2021- Deferred secondary to left knee aching- will attempt next visit.    Time 12    Period Weeks    Status On-going    Target Date 05/13/21      PT LONG  TERM GOAL #3   Title Pt will decrease 5TSTS by at least 3 seconds in order to demonstrate clinically significant improvement in LE strength.    Baseline 09/20/20: 23.1s with minA+1 for strength and balance (Previous discharge 03/06/20: 16.1s), 12/14: 20 sec, 1/13: 22 sec.   01/02/2021= 16.05 sec with light UE support. 01/21/21 01/21/21 37.90 seconds without UE, with UE support 12.93 seconds: 02/06/2021= with 1 UE 17.93 sec. 02/18/2021= 21.33 sec without UE support.    Time 12    Period Weeks    Status On-going    Target Date 05/13/21      PT LONG TERM GOAL #4   Title Pt will increase his self-selected 10MWT to >0.74 m/s in order to demonstrate improvement in gait speed and improved community ambulation    Baseline 09/20/20: self-selected: 14.4s = 0.69 m/s with rollator (Previous discharge 03/06/20: self-selected: 13.6s = 0.74 m/s), 12/14: 0.75 m/s, 1/13: 0.78 m/s. 01/02/2021= 0.85 m/s using 4WW, 01/16/21 with 4 WW .99 m/s. 02/06/2021= self selected = 0.93 m/s with 4XL    Time 4    Period Weeks    Status Achieved      PT LONG TERM GOAL #5   Title Patient will increase six minute walk test distance to >1000 for progression to community ambulator and improve gait ability    Baseline 01/10/20  620 ft with rollator, 01/21/21  965 ft; 02/06/2021= 910 feet with 4WW.  02/18/2021= 950 feet with 4WW    Time 12    Period Weeks    Status On-going    Target Date 05/13/21                 Plan - 03/20/21 1135    Clinical Impression Statement Patient challenged with balance exercises including addition of BOSU ball and white foam roll. He was able to perform all therex well and improved with practice with each activity today. No reported increased in left knee  except for lunges- able to modify- partial lunge without pain.  Deferred goal testing this visit and will reassess next visit however Patient's condition has the potential to improve in response to therapy. Maximum improvement is yet to be obtained. The  anticipated improvement is attainable and reasonable in a generally predictable time.  Patient reports Pt will benefit from further skilled PT to improve BLE strength, balance and functional mobility    Personal Factors and Comorbidities Age;Comorbidity 3+    Comorbidities HTN, CVA, cholecystitis with cholecystostomy drain    Examination-Activity Limitations Bend;Lift;Squat;Locomotion Level;Stairs;Stand    Examination-Participation Restrictions Community Activity;Driving;Laundry;Meal Prep;Shop    Stability/Clinical Decision Making Unstable/Unpredictable    Rehab Potential Fair    PT Frequency 2x / week    PT Duration 4 weeks    PT Treatment/Interventions ADLs/Self Care Home Management;Canalith Repostioning;Cryotherapy;Electrical Stimulation;Iontophoresis 4mg /ml Dexamethasone;Moist Heat;Traction;Ultrasound;DME Instruction;Gait training;Stair training;Therapeutic activities;Therapeutic exercise;Functional mobility training;Balance training;Neuromuscular re-education;Patient/family education;Manual techniques;Dry needling;Vestibular;Joint Manipulations;Spinal Manipulations;Aquatic Therapy    PT Next Visit Plan Continue to focus on LE strengthening and balance, dynamic balance tasks    Consulted and Agree with Plan of Care Patient;Family member/caregiver           Patient will benefit from skilled therapeutic intervention in order to improve the following deficits and impairments:  Abnormal gait,Decreased balance,Decreased mobility,Difficulty walking,Decreased knowledge of precautions,Decreased safety awareness,Decreased activity tolerance,Decreased strength,Decreased endurance  Visit Diagnosis: Abnormality of gait and mobility  Difficulty in walking, not elsewhere classified  Muscle weakness (generalized)  Unsteadiness on feet     Problem List Patient Active Problem List   Diagnosis Date Noted  . Moderate protein-calorie malnutrition ([place]) 03/11/2021  . Hemiparesis affecting left side  as late effect of cerebrovascular accident (CVA) ([place]) 03/11/2021  . Aneurysm, aorta, thoracic ([place]) 12/21/2020  . Calculus of bile duct without cholecystitis and without obstruction   . Non-small cell carcinoma of lung, right ([place]) 09/07/2020  . Mass of upper lobe of right lung 07/13/2020  . Calculus of bile duct with cholecystitis without obstruction   . Abnormal findings on imaging of biliary tract   . Acute cholecystitis 05/02/2020  . Chronic venous insufficiency 12/05/2019  . 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 CKD (chronic kidney disease), stage II   . New onset atrial fibrillation ([place])   . 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 Goals of care, counseling/discussion   . Palliative care by specialist   . DNR (do not resuscitate) discussion   . History of CVA (cerebrovascular accident) 05/22/2019  . Persistent proteinuria 07/30/2018  . 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 Chronic kidney disease, stage III (moderate) ([place]) 06/15/2018  . Leg pain 05/06/2018  . Lymphedema 05/06/2018  . Coagulopathy ([place]) 07/07/2017  . Lumbar spondylosis 03/10/2017  . Elevated uric acid in blood 11/20/2016  . BPH (benign prostatic hyperplasia) 05/29/2016  . Peripheral vascular disease of lower extremity ([place]) 03/03/2016  . Gallstone 12/31/2015  . 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 Atherosclerosis of aorta ([place]) 11/22/2015  . Carotid artery narrowing 11/22/2015  . Diverticulosis of colon 11/22/2015  . Decreased creatinine clearance 11/22/2015  . Arthritis, degenerative 11/22/2015  . Lactose intolerance 11/22/2015  . Basal cell carcinoma 11/22/2015  . Essential hypertension 06/21/2015  . Hyperlipemia 06/21/2015  . GERD (gastroesophageal reflux disease) 06/21/2015  . Calculus of kidney 11/18/2013    Lewis Moccasin, PT 03/21/2021, 11:43 AM  [place] MAIN [facility]  SERVICES  [place], [place], [address] Phone: [phone]   Fax:  [phone]  Name: Jose Castro MRN: [id number] Date of Birth: 28-May-1926

## 2021-03-25 ENCOUNTER — Ambulatory Visit: Payer: Medicare Other

## 2021-03-25 ENCOUNTER — Other Ambulatory Visit: Payer: Self-pay

## 2021-03-25 DIAGNOSIS — M6281 Muscle weakness (generalized): Secondary | ICD-10-CM

## 2021-03-25 DIAGNOSIS — R262 Difficulty in walking, not elsewhere classified: Secondary | ICD-10-CM

## 2021-03-25 DIAGNOSIS — R269 Unspecified abnormalities of gait and mobility: Secondary | ICD-10-CM

## 2021-03-25 NOTE — Therapy (Signed)
[place] MAIN [facility] SERVICES [address] [place], [place], [address] Phone: [phone]   Fax:  [phone]  Physical Therapy Treatment  Patient Details  Name: Jose Castro MRN: [id number] Date of Birth: [birth year] Referring Provider (PT): Dr. Ancil Boozer   Encounter Date: 03/25/2021   PT End of Session - 03/25/21 1352    Visit Number 21    Number of Visits 57    Date for PT Re-Evaluation 05/13/21    Authorization Type eval: 09/20/20, Progress note 01/02/2021, PN on 08/03/7901, Recert = 03/09/7352-2/99/2426; PN on 4/202022    PT Start Time 1345    PT Stop Time 1430    PT Time Calculation (min) 45 min    Equipment Utilized During Treatment Gait belt    Activity Tolerance Patient tolerated treatment well    Behavior During Therapy [facility] for tasks assessed/performed           Past Medical History:  Diagnosis Date  . Allergy    Penicillin  . Arthritis   . Basal cell carcinoma   . BPH (benign prostatic hyperplasia)   . Chronic kidney disease    had a kidney stone which per family was a cyst that was removed  . Dysrhythmia   . GERD (gastroesophageal reflux disease)   . History of kidney stones   . Hyperlipidemia   . Hypertension   . Left-sided low back pain with left-sided sciatica   . Lung mass   . Splenic vein thrombosis   . Stroke ([place]) 05/22/2019  . Thrombosis 12/2015   mural  area and no notation of heart attack    Past Surgical History:  Procedure Laterality Date  . APPENDECTOMY    . CATARACT EXTRACTION, BILATERAL    . CYSTOSCOPY WITH INSERTION OF UROLIFT    . ENDOSCOPIC RETROGRADE CHOLANGIOPANCREATOGRAPHY (ERCP) WITH PROPOFOL N/A 05/11/2020   Procedure: ENDOSCOPIC RETROGRADE CHOLANGIOPANCREATOGRAPHY (ERCP) WITH PROPOFOL;  Surgeon: [name], MD;  Location: [facility] ENDOSCOPY;  Service: Endoscopy;  Laterality: N/A;  . ERCP N/A 10/23/2020   Procedure: ENDOSCOPIC RETROGRADE CHOLANGIOPANCREATOGRAPHY (ERCP);  Surgeon: [name], MD;   Location: [facility] ENDOSCOPY;  Service: Endoscopy;  Laterality: N/A;  . EYE SURGERY  08/2018  . IR EXCHANGE BILIARY DRAIN  05/04/2020  . KIDNEY STONE SURGERY    . KNEE SURGERY    . PACEMAKER INSERTION N/A 07/13/2018   Procedure: INSERTION PACEMAKER-DUEL CHAMBER INITIAL IMPLANT;  Surgeon: Isaias Cowman, MD;  Location: [facility] ORS;  Service: Cardiovascular;  Laterality: N/A;  . SPINE SURGERY      There were no vitals filed for this visit.   Subjective Assessment - 03/25/21 1348    Subjective Patient reports feeing okay today with no new complaints. He denies any falls and reports knee is okay today.    Patient is accompained by: Family member    Pertinent History Pt referred for deconditioning and gait instability. He has had a decline since he last finished therapy. Pt had a hospital admission from 05/02/20 to 05/14/20 for acute cholecystitis and is now s/p cholecystostomy drain placement. Drain is still in place and pt is waiting to hear when they plan to remove it. Pt discharged to SNF and then returned home. In addition pt underwent radiation therapy for a RUL lung mass/cancer and had his last radiation treatment in September of 2021. He reports that he has continued to walk at home as his primary exercise. Pt was previously seen at this clinic for PT/OT due to weakness/imbalance s/p CVA. Prior  history from 09/21/19: Pt is a 87 year old male who presents with imbalance and difficulty with gait following a right frontal CVA with left hemiparesis on 05/22/19.  He has completed inpatient and home health PT, and now presents for OP PT.  He was discharged from inpatient rehab at a supervision level of assistance, ambulating with a RW.  His family notes that he is impulsive and will try to ambulate without his RW at home.  Family is currently providing 24/7 supervision.    Limitations House hold activities;Walking    How long can you sit comfortably? no limitations    How long can you walk comfortably? he  requires a RW    Patient Stated Goals Pt wants to be able to walk his granddaughter down the aisle at her wedding November 2022    Currently in Pain? Yes    Pain Score 2     Pain Location Knee    Pain Orientation Left    Pain Descriptors / Indicators Aching    Pain Type Acute pain           Interventions:  Nustep (level 3) LE only at seat level 10 x 6 min at total distance of 0.20 miles  Seated Hip march with abd (up and over red cone) with 4# x 15 reps each.   Seated knee ext with eccentric hold using 4# 2 sets x 15 reps each  Seated hamstring curl using 7.5# on Matrix cable system- 2sets of 15 reps.  Education provided throughout session via VC/TC and demonstration to facilitate movement at target joints and correct muscle activation for all testing and exercises performed.   Dynamic standing in // bars - placing 1/2 white foam roll on top of bars Flat side up and then patient positioned 6 cones on then off the foam focusing on static stand with no UE (dual task). Patient performed well - very patient applying cones on top of foam- exhibiting no difficulty.  Patient progressed to static standing on purple foam pad and performed the same activity with increased anterior/posterior sway and 1 LOB with physical assistance.    Clinical Impression: Patient performed well with more dynamic balance activities with minimal assist at times- Able to dual task well today without significant unsteadiness. Patient able to progress to more resistive strengthening today without increased in left knee pain. Pt will benefit from further skilled PT to improve BLE strength, balance and functional mobility                           PT Education - 03/25/21 1350    Education Details specific exercise form    Person(s) Educated Patient    Methods Explanation;Demonstration;Tactile cues;Verbal cues    Comprehension Verbalized understanding;Returned demonstration;Verbal cues  required;Need further instruction            PT Short Term Goals - 01/21/21 1438      PT SHORT TERM GOAL #1   Title Pt will be independent with HEP in order to improve strength and balance in order to decrease fall risk and improve function at home.    Baseline 01/02/2021- Patient verbalize understanding of walking and seated exercises for home program. 01/21/21 patient has been more active and understands HEP.    Time 6    Period Weeks    Status Achieved    Target Date 01/10/21             PT Long Term Goals -  03/20/21 1136      PT LONG TERM GOAL #1   Title Pt will improve BERG by at least 3 points in order to demonstrate clinically significant improvement in balance.    Baseline 09/20/20: 32/56 (Previous discharge: 03/06/20: 46/56), 12/14: 32/56, 1/13: 31/56, 01/16/21 35/56, 02/06/2021=36/56. 02/18/2021= 37/56.03/20/2021- Deferred secondary to left knee aching- will attempt next visit.    Time 12    Period Weeks    Status On-going    Target Date 05/13/21      PT LONG TERM GOAL #2   Title Pt will decrease TUG to below 14 seconds/decrease in order to demonstrate decreased fall risk.    Baseline 09/20/20: 19.2s, 12/14: 17.48, 1/13: 16.65 sec. 01/02/2021=15.75 sec using 4WW, 01/21/21  14.37 seconds. 02/06/2021= 14.92 sec using 4WW. 02/18/2021= 14.5 sec using 4WW. 03/20/2021- Deferred secondary to left knee aching- will attempt next visit.    Time 12    Period Weeks    Status On-going    Target Date 05/13/21      PT LONG TERM GOAL #3   Title Pt will decrease 5TSTS by at least 3 seconds in order to demonstrate clinically significant improvement in LE strength.    Baseline 09/20/20: 23.1s with minA+1 for strength and balance (Previous discharge 03/06/20: 16.1s), 12/14: 20 sec, 1/13: 22 sec.   01/02/2021= 16.05 sec with light UE support. 01/21/21 01/21/21 37.90 seconds without UE, with UE support 12.93 seconds: 02/06/2021= with 1 UE 17.93 sec. 02/18/2021= 21.33 sec without UE support.    Time 12     Period Weeks    Status On-going    Target Date 05/13/21      PT LONG TERM GOAL #4   Title Pt will increase his self-selected 10MWT to >0.74 m/s in order to demonstrate improvement in gait speed and improved community ambulation    Baseline 09/20/20: self-selected: 14.4s = 0.69 m/s with rollator (Previous discharge 03/06/20: self-selected: 13.6s = 0.74 m/s), 12/14: 0.75 m/s, 1/13: 0.78 m/s. 01/02/2021= 0.85 m/s using 4WW, 01/16/21 with 4 WW .99 m/s. 02/06/2021= self selected = 0.93 m/s with 1OX    Time 4    Period Weeks    Status Achieved      PT LONG TERM GOAL #5   Title Patient will increase six minute walk test distance to >1000 for progression to community ambulator and improve gait ability    Baseline 01/10/20  620 ft with rollator, 01/21/21  965 ft; 02/06/2021= 910 feet with 4WW.  02/18/2021= 950 feet with 4WW    Time 12    Period Weeks    Status On-going    Target Date 05/13/21                 Plan - 03/25/21 1351    Clinical Impression Statement Patient performed well with more dynamic balance activities with minimal assist at times- Able to dual task well today without significant unsteadiness. Patient able to progress to more resistive strengthening today without increased in left knee pain. Pt will benefit from further skilled PT to improve BLE strength, balance and functional mobility    Personal Factors and Comorbidities Age;Comorbidity 3+    Comorbidities HTN, CVA, cholecystitis with cholecystostomy drain    Examination-Activity Limitations Bend;Lift;Squat;Locomotion Level;Stairs;Stand    Examination-Participation Restrictions Community Activity;Driving;Laundry;Meal Prep;Shop    Stability/Clinical Decision Making Unstable/Unpredictable    Rehab Potential Fair    PT Frequency 2x / week    PT Duration 4 weeks    PT Treatment/Interventions ADLs/Self Care Home  Management;Canalith Repostioning;Cryotherapy;Electrical Stimulation;Iontophoresis 4mg /ml Dexamethasone;Moist  Heat;Traction;Ultrasound;DME Instruction;Gait training;Stair training;Therapeutic activities;Therapeutic exercise;Functional mobility training;Balance training;Neuromuscular re-education;Patient/family education;Manual techniques;Dry needling;Vestibular;Joint Manipulations;Spinal Manipulations;Aquatic Therapy    PT Next Visit Plan Continue to focus on LE strengthening and balance, dynamic balance tasks    Consulted and Agree with Plan of Care Patient;Family member/caregiver           Patient will benefit from skilled therapeutic intervention in order to improve the following deficits and impairments:  Abnormal gait,Decreased balance,Decreased mobility,Difficulty walking,Decreased knowledge of precautions,Decreased safety awareness,Decreased activity tolerance,Decreased strength,Decreased endurance  Visit Diagnosis: Abnormality of gait and mobility  Difficulty in walking, not elsewhere classified  Muscle weakness (generalized)     Problem List Patient Active Problem List   Diagnosis Date Noted  . Moderate protein-calorie malnutrition ([place]) 03/11/2021  . Hemiparesis affecting left side as late effect of cerebrovascular accident (CVA) ([place]) 03/11/2021  . Aneurysm, aorta, thoracic ([place]) 12/21/2020  . Calculus of bile duct without cholecystitis and without obstruction   . Non-small cell carcinoma of lung, right ([place]) 09/07/2020  . Mass of upper lobe of right lung 07/13/2020  . Calculus of bile duct with cholecystitis without obstruction   . Abnormal findings on imaging of biliary tract   . Acute cholecystitis 05/02/2020  . Chronic venous insufficiency 12/05/2019  . 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 CKD (chronic kidney disease), stage II   . New onset atrial fibrillation ([place])   . 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 Goals of care, counseling/discussion   . Palliative care by specialist   . DNR (do not resuscitate) discussion   . History of CVA  (cerebrovascular accident) 05/22/2019  . Persistent proteinuria 07/30/2018  . 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 Chronic kidney disease, stage III (moderate) ([place]) 06/15/2018  . Leg pain 05/06/2018  . Lymphedema 05/06/2018  . Coagulopathy ([place]) 07/07/2017  . Lumbar spondylosis 03/10/2017  . Elevated uric acid in blood 11/20/2016  . BPH (benign prostatic hyperplasia) 05/29/2016  . Peripheral vascular disease of lower extremity ([place]) 03/03/2016  . Gallstone 12/31/2015  . 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 Atherosclerosis of aorta ([place]) 11/22/2015  . Carotid artery narrowing 11/22/2015  . Diverticulosis of colon 11/22/2015  . Decreased creatinine clearance 11/22/2015  . Arthritis, degenerative 11/22/2015  . Lactose intolerance 11/22/2015  . Basal cell carcinoma 11/22/2015  . Essential hypertension 06/21/2015  . Hyperlipemia 06/21/2015  . GERD (gastroesophageal reflux disease) 06/21/2015  . Calculus of kidney 11/18/2013    Lewis Moccasin, PT 03/26/2021, 1:09 PM  [place] MAIN [facility] SERVICES [address] [place], [place], [address] Phone: [phone]   Fax:  [phone]  Name: Jose Castro MRN: [id number] Date of Birth: 09-Aug-1926

## 2021-03-27 ENCOUNTER — Ambulatory Visit: Payer: Medicare Other

## 2021-03-27 ENCOUNTER — Other Ambulatory Visit: Payer: Self-pay

## 2021-03-27 DIAGNOSIS — R269 Unspecified abnormalities of gait and mobility: Secondary | ICD-10-CM

## 2021-03-27 DIAGNOSIS — M6281 Muscle weakness (generalized): Secondary | ICD-10-CM

## 2021-03-27 DIAGNOSIS — R278 Other lack of coordination: Secondary | ICD-10-CM

## 2021-03-27 DIAGNOSIS — R262 Difficulty in walking, not elsewhere classified: Secondary | ICD-10-CM

## 2021-03-27 NOTE — Therapy (Signed)
[place] MAIN [facility] SERVICES [address] [place], [place], [address] Phone: [phone]   Fax:  [phone]  Physical Therapy Treatment  Patient Details  Name: Jose Castro MRN: [id number] Date of Birth: 1926-05-02 Referring Provider (PT): Dr. Ancil Boozer   Encounter Date: 03/27/2021   PT End of Session - 03/27/21 1355    Visit Number 22    Number of Visits 72    Date for PT Re-Evaluation 05/13/21    Authorization Type eval: 09/20/20, Progress note 01/02/2021, PN on 02/05/8587, Recert = 04/01/7740-2/87/8676; PN on 4/202022    PT Start Time 1346    PT Stop Time 1428    PT Time Calculation (min) 42 min    Equipment Utilized During Treatment Gait belt    Activity Tolerance Patient tolerated treatment well    Behavior During Therapy [facility] for tasks assessed/performed           Past Medical History:  Diagnosis Date  . Allergy    Penicillin  . Arthritis   . Basal cell carcinoma   . BPH (benign prostatic hyperplasia)   . Chronic kidney disease    had a kidney stone which per family was a cyst that was removed  . Dysrhythmia   . GERD (gastroesophageal reflux disease)   . History of kidney stones   . Hyperlipidemia   . Hypertension   . Left-sided low back pain with left-sided sciatica   . Lung mass   . Splenic vein thrombosis   . Stroke ([place]) 05/22/2019  . Thrombosis 12/2015   mural  area and no notation of heart attack    Past Surgical History:  Procedure Laterality Date  . APPENDECTOMY    . CATARACT EXTRACTION, BILATERAL    . CYSTOSCOPY WITH INSERTION OF UROLIFT    . ENDOSCOPIC RETROGRADE CHOLANGIOPANCREATOGRAPHY (ERCP) WITH PROPOFOL N/A 05/11/2020   Procedure: ENDOSCOPIC RETROGRADE CHOLANGIOPANCREATOGRAPHY (ERCP) WITH PROPOFOL;  Surgeon: [name], MD;  Location: [facility] ENDOSCOPY;  Service: Endoscopy;  Laterality: N/A;  . ERCP N/A 10/23/2020   Procedure: ENDOSCOPIC RETROGRADE CHOLANGIOPANCREATOGRAPHY (ERCP);  Surgeon: [name], MD;   Location: [facility] ENDOSCOPY;  Service: Endoscopy;  Laterality: N/A;  . EYE SURGERY  08/2018  . IR EXCHANGE BILIARY DRAIN  05/04/2020  . KIDNEY STONE SURGERY    . KNEE SURGERY    . PACEMAKER INSERTION N/A 07/13/2018   Procedure: INSERTION PACEMAKER-DUEL CHAMBER INITIAL IMPLANT;  Surgeon: Isaias Cowman, MD;  Location: [facility] ORS;  Service: Cardiovascular;  Laterality: N/A;  . SPINE SURGERY      There were no vitals filed for this visit.   Subjective Assessment - 03/27/21 1350    Subjective Patient reports his knee is wrapped and feeling pretty good today.    Patient is accompained by: Family member    Pertinent History Pt referred for deconditioning and gait instability. He has had a decline since he last finished therapy. Pt had a hospital admission from 05/02/20 to 05/14/20 for acute cholecystitis and is now s/p cholecystostomy drain placement. Drain is still in place and pt is waiting to hear when they plan to remove it. Pt discharged to SNF and then returned home. In addition pt underwent radiation therapy for a RUL lung mass/cancer and had his last radiation treatment in September of 2021. He reports that he has continued to walk at home as his primary exercise. Pt was previously seen at this clinic for PT/OT due to weakness/imbalance s/p CVA. Prior history from 09/21/19: Pt is a 87 year old male  who presents with imbalance and difficulty with gait following a right frontal CVA with left hemiparesis on 05/22/19.  He has completed inpatient and home health PT, and now presents for OP PT.  He was discharged from inpatient rehab at a supervision level of assistance, ambulating with a RW.  His family notes that he is impulsive and will try to ambulate without his RW at home.  Family is currently providing 24/7 supervision.    Limitations House hold activities;Walking    How long can you sit comfortably? no limitations    How long can you walk comfortably? he requires a RW    Patient Stated Goals Pt  wants to be able to walk his granddaughter down the aisle at her wedding November 2022    Currently in Pain? Yes    Pain Score 3     Pain Location Knee    Pain Descriptors / Indicators Aching    Pain Type Chronic pain           Therapeutic exercises:   Sit to stand with left foot on purple pad x 10 reps with min UE support  Seated hamstring curl using 7.5# on Matrix cable system- 2sets of 15 reps.  Neuromuscular re-education:   Ambulation in hallway-  Forward walking with head turns up/down, side/side x80 feet each Patient required mod VCs to increase step lengthRequired cues to improve gaze stabilization with head turns to reduce dizziness and reduce veering side/side.  Static stand on blue airex pad - no UE support x 30 sec x 3 trials.  Forward/backward gait with 2# in // bars with cues for increased step length going backward- Patient with mild unsteadiness.      Clinical Impression: Patient performed well with ambulation in hallway with dynamic head movements. He performed well with resistive strengthening exercises with no report of left knee pain and challenged today with retro gait exhibiting posterior LE weakness. Pt will benefit from further skilled PT to improve BLE strength, balance and functional mobility                             PT Education - 03/28/21 1714    Education Details specific exercise form    Person(s) Educated Patient    Methods Explanation;Demonstration;Tactile cues;Verbal cues    Comprehension Verbalized understanding;Returned demonstration;Verbal cues required;Need further instruction            PT Short Term Goals - 01/21/21 1438      PT SHORT TERM GOAL #1   Title Pt will be independent with HEP in order to improve strength and balance in order to decrease fall risk and improve function at home.    Baseline 01/02/2021- Patient verbalize understanding of walking and seated exercises for home program. 01/21/21 patient has  been more active and understands HEP.    Time 6    Period Weeks    Status Achieved    Target Date 01/10/21             PT Long Term Goals - 03/20/21 1136      PT LONG TERM GOAL #1   Title Pt will improve BERG by at least 3 points in order to demonstrate clinically significant improvement in balance.    Baseline 09/20/20: 32/56 (Previous discharge: 03/06/20: 46/56), 12/14: 32/56, 1/13: 31/56, 01/16/21 35/56, 02/06/2021=36/56. 02/18/2021= 37/56.03/20/2021- Deferred secondary to left knee aching- will attempt next visit.    Time 12    Period  Weeks    Status On-going    Target Date 05/13/21      PT LONG TERM GOAL #2   Title Pt will decrease TUG to below 14 seconds/decrease in order to demonstrate decreased fall risk.    Baseline 09/20/20: 19.2s, 12/14: 17.48, 1/13: 16.65 sec. 01/02/2021=15.75 sec using 4WW, 01/21/21  14.37 seconds. 02/06/2021= 14.92 sec using 4WW. 02/18/2021= 14.5 sec using 4WW. 03/20/2021- Deferred secondary to left knee aching- will attempt next visit.    Time 12    Period Weeks    Status On-going    Target Date 05/13/21      PT LONG TERM GOAL #3   Title Pt will decrease 5TSTS by at least 3 seconds in order to demonstrate clinically significant improvement in LE strength.    Baseline 09/20/20: 23.1s with minA+1 for strength and balance (Previous discharge 03/06/20: 16.1s), 12/14: 20 sec, 1/13: 22 sec.   01/02/2021= 16.05 sec with light UE support. 01/21/21 01/21/21 37.90 seconds without UE, with UE support 12.93 seconds: 02/06/2021= with 1 UE 17.93 sec. 02/18/2021= 21.33 sec without UE support.    Time 12    Period Weeks    Status On-going    Target Date 05/13/21      PT LONG TERM GOAL #4   Title Pt will increase his self-selected 10MWT to >0.74 m/s in order to demonstrate improvement in gait speed and improved community ambulation    Baseline 09/20/20: self-selected: 14.4s = 0.69 m/s with rollator (Previous discharge 03/06/20: self-selected: 13.6s = 0.74 m/s), 12/14: 0.75 m/s, 1/13:  0.78 m/s. 01/02/2021= 0.85 m/s using 4WW, 01/16/21 with 4 WW .99 m/s. 02/06/2021= self selected = 0.93 m/s with 6UY    Time 4    Period Weeks    Status Achieved      PT LONG TERM GOAL #5   Title Patient will increase six minute walk test distance to >1000 for progression to community ambulator and improve gait ability    Baseline 01/10/20  620 ft with rollator, 01/21/21  965 ft; 02/06/2021= 910 feet with 4WW.  02/18/2021= 950 feet with 4WW    Time 12    Period Weeks    Status On-going    Target Date 05/13/21                 Plan - 03/27/21 1355    Clinical Impression Statement Patient performed well with ambulation in hallway with dynamic head movements. He performed well with resistive strengthening exercises with no report of left knee pain and challenged today with retro gait exhibiting posterior LE weakness. Pt will benefit from further skilled PT to improve BLE strength, balance and functional mobility    Personal Factors and Comorbidities Age;Comorbidity 3+    Comorbidities HTN, CVA, cholecystitis with cholecystostomy drain    Examination-Activity Limitations Bend;Lift;Squat;Locomotion Level;Stairs;Stand    Examination-Participation Restrictions Community Activity;Driving;Laundry;Meal Prep;Shop    Stability/Clinical Decision Making Unstable/Unpredictable    Rehab Potential Fair    PT Frequency 2x / week    PT Duration 4 weeks    PT Treatment/Interventions ADLs/Self Care Home Management;Canalith Repostioning;Cryotherapy;Electrical Stimulation;Iontophoresis 4mg /ml Dexamethasone;Moist Heat;Traction;Ultrasound;DME Instruction;Gait training;Stair training;Therapeutic activities;Therapeutic exercise;Functional mobility training;Balance training;Neuromuscular re-education;Patient/family education;Manual techniques;Dry needling;Vestibular;Joint Manipulations;Spinal Manipulations;Aquatic Therapy    PT Next Visit Plan Continue to focus on LE strengthening and balance, dynamic balance tasks     Consulted and Agree with Plan of Care Patient;Family member/caregiver           Patient will benefit from skilled therapeutic intervention in order to improve  the following deficits and impairments:  Abnormal gait,Decreased balance,Decreased mobility,Difficulty walking,Decreased knowledge of precautions,Decreased safety awareness,Decreased activity tolerance,Decreased strength,Decreased endurance  Visit Diagnosis: Abnormality of gait and mobility  Difficulty in walking, not elsewhere classified  Muscle weakness (generalized)  Other lack of coordination     Problem List Patient Active Problem List   Diagnosis Date Noted  . Moderate protein-calorie malnutrition ([place]) 03/11/2021  . Hemiparesis affecting left side as late effect of cerebrovascular accident (CVA) ([place]) 03/11/2021  . Aneurysm, aorta, thoracic ([place]) 12/21/2020  . Calculus of bile duct without cholecystitis and without obstruction   . Non-small cell carcinoma of lung, right ([place]) 09/07/2020  . Mass of upper lobe of right lung 07/13/2020  . Calculus of bile duct with cholecystitis without obstruction   . Abnormal findings on imaging of biliary tract   . Acute cholecystitis 05/02/2020  . Chronic venous insufficiency 12/05/2019  . 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 CKD (chronic kidney disease), stage II   . New onset atrial fibrillation ([place])   . 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 Goals of care, counseling/discussion   . Palliative care by specialist   . DNR (do not resuscitate) discussion   . History of CVA (cerebrovascular accident) 05/22/2019  . Persistent proteinuria 07/30/2018  . 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 Chronic kidney disease, stage III (moderate) ([place]) 06/15/2018  . Leg pain 05/06/2018  . Lymphedema 05/06/2018  . Coagulopathy ([place]) 07/07/2017  . Lumbar spondylosis 03/10/2017  . Elevated uric acid in blood 11/20/2016  . BPH (benign prostatic  hyperplasia) 05/29/2016  . Peripheral vascular disease of lower extremity ([place]) 03/03/2016  . Gallstone 12/31/2015  . 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 Atherosclerosis of aorta ([place]) 11/22/2015  . Carotid artery narrowing 11/22/2015  . Diverticulosis of colon 11/22/2015  . Decreased creatinine clearance 11/22/2015  . Arthritis, degenerative 11/22/2015  . Lactose intolerance 11/22/2015  . Basal cell carcinoma 11/22/2015  . Essential hypertension 06/21/2015  . Hyperlipemia 06/21/2015  . GERD (gastroesophageal reflux disease) 06/21/2015  . Calculus of kidney 11/18/2013    Lewis Moccasin, PT 03/28/2021, 5:24 PM  [place] MAIN [facility] SERVICES [address] [place], [place], [address] Phone: [phone]   Fax:  [phone]  Name: Jose Castro MRN: [id number] Date of Birth: 1926-03-24

## 2021-03-28 ENCOUNTER — Ambulatory Visit (INDEPENDENT_AMBULATORY_CARE_PROVIDER_SITE_OTHER): Payer: Medicare Other

## 2021-03-28 DIAGNOSIS — Z0000 Encounter for general adult medical examination without abnormal findings: Secondary | ICD-10-CM

## 2021-03-28 NOTE — Patient Instructions (Signed)
Jose Castro , Thank you for taking time to come for your Medicare Wellness Visit. I appreciate your ongoing commitment to your health goals. Please review the following plan we discussed and let me know if I can assist you in the future.   Screening recommendations/referrals: Colonoscopy: no longer required Recommended yearly ophthalmology/optometry visit for glaucoma screening and checkup Recommended yearly dental visit for hygiene and checkup  Vaccinations: Influenza vaccine: done 09/07/20 Pneumococcal vaccine: done 02/06/14 Tdap vaccine: done 06/08/12 Shingles vaccine: n/a    Covid-19: done 01/23/20 & 02/21/20  Advanced directives: Please bring a copy of your health care power of attorney and living will to the office at your convenience.  Conditions/risks identified: Recommend continuing fall prevention in the home.   Next appointment: Follow up in one year for your annual wellness visit.   Preventive Care 85 Years and Older, Male Preventive care refers to lifestyle choices and visits with your health care provider that can promote health and wellness. What does preventive care include?  A yearly physical exam. This is also called an annual well check.  Dental exams once or twice a year.  Routine eye exams. Ask your health care provider how often you should have your eyes checked.  Personal lifestyle choices, including:  Daily care of your teeth and gums.  Regular physical activity.  Eating a healthy diet.  Avoiding tobacco and drug use.  Limiting alcohol use.  Practicing safe sex.  Taking low doses of aspirin every day.  Taking vitamin and mineral supplements as recommended by your health care provider. What happens during an annual well check? The services and screenings done by your health care provider during your annual well check will depend on your age, overall health, lifestyle risk factors, and family history of disease. Counseling  Your health care provider may  ask you questions about your:  Alcohol use.  Tobacco use.  Drug use.  Emotional well-being.  Home and relationship well-being.  Sexual activity.  Eating habits.  History of falls.  Memory and ability to understand (cognition).  Work and work Statistician. Screening  You may have the following tests or measurements:  Height, weight, and BMI.  Blood pressure.  Lipid and cholesterol levels. These may be checked every 5 years, or more frequently if you are over 40 years old.  Skin check.  Lung cancer screening. You may have this screening every year starting at age 67 if you have a 30-pack-year history of smoking and currently smoke or have quit within the past 15 years.  Fecal occult blood test (FOBT) of the stool. You may have this test every year starting at age 67.  Flexible sigmoidoscopy or colonoscopy. You may have a sigmoidoscopy every 5 years or a colonoscopy every 10 years starting at age 54.  Prostate cancer screening. Recommendations will vary depending on your family history and other risks.  Hepatitis C blood test.  Hepatitis B blood test.  Sexually transmitted disease (STD) testing.  Diabetes screening. This is done by checking your blood sugar (glucose) after you have not eaten for a while (fasting). You may have this done every 1-3 years.  Abdominal aortic aneurysm (AAA) screening. You may need this if you are a current or former smoker.  Osteoporosis. You may be screened starting at age 63 if you are at high risk. Talk with your health care provider about your test results, treatment options, and if necessary, the need for more tests. Vaccines  Your health care provider may recommend certain  vaccines, such as:  Influenza vaccine. This is recommended every year.  Tetanus, diphtheria, and acellular pertussis (Tdap, Td) vaccine. You may need a Td booster every 10 years.  Zoster vaccine. You may need this after age 72.  Pneumococcal 13-valent  conjugate (PCV13) vaccine. One dose is recommended after age 58.  Pneumococcal polysaccharide (PPSV23) vaccine. One dose is recommended after age 74. Talk to your health care provider about which screenings and vaccines you need and how often you need them. This information is not intended to replace advice given to you by your health care provider. Make sure you discuss any questions you have with your health care provider. Document Released: 12/14/2015 Document Revised: 08/06/2016 Document Reviewed: 09/18/2015 Elsevier Interactive Patient Education  2017 [place] Prevention in the Home Falls can cause injuries. They can happen to people of all ages. There are many things you can do to make your home safe and to help prevent falls. What can I do on the outside of my home?  Regularly fix the edges of walkways and driveways and fix any cracks.  Remove anything that might make you trip as you walk through a door, such as a raised step or threshold.  Trim any bushes or trees on the path to your home.  Use bright outdoor lighting.  Clear any walking paths of anything that might make someone trip, such as rocks or tools.  Regularly check to see if handrails are loose or broken. Make sure that both sides of any steps have handrails.  Any raised decks and porches should have guardrails on the edges.  Have any leaves, snow, or ice cleared regularly.  Use sand or salt on walking paths during winter.  Clean up any spills in your garage right away. This includes oil or grease spills. What can I do in the bathroom?  Use night lights.  Install grab bars by the toilet and in the tub and shower. Do not use towel bars as grab bars.  Use non-skid mats or decals in the tub or shower.  If you need to sit down in the shower, use a plastic, non-slip stool.  Keep the floor dry. Clean up any water that spills on the floor as soon as it happens.  Remove soap buildup in the tub or  shower regularly.  Attach bath mats securely with double-sided non-slip rug tape.  Do not have throw rugs and other things on the floor that can make you trip. What can I do in the bedroom?  Use night lights.  Make sure that you have a light by your bed that is easy to reach.  Do not use any sheets or blankets that are too big for your bed. They should not hang down onto the floor.  Have a firm chair that has side arms. You can use this for support while you get dressed.  Do not have throw rugs and other things on the floor that can make you trip. What can I do in the kitchen?  Clean up any spills right away.  Avoid walking on wet floors.  Keep items that you use a lot in easy-to-reach places.  If you need to reach something above you, use a strong step stool that has a grab bar.  Keep electrical cords out of the way.  Do not use floor polish or wax that makes floors slippery. If you must use wax, use non-skid floor wax.  Do not have throw rugs and other things  on the floor that can make you trip. What can I do with my stairs?  Do not leave any items on the stairs.  Make sure that there are handrails on both sides of the stairs and use them. Fix handrails that are broken or loose. Make sure that handrails are as long as the stairways.  Check any carpeting to make sure that it is firmly attached to the stairs. Fix any carpet that is loose or worn.  Avoid having throw rugs at the top or bottom of the stairs. If you do have throw rugs, attach them to the floor with carpet tape.  Make sure that you have a light switch at the top of the stairs and the bottom of the stairs. If you do not have them, ask someone to add them for you. What else can I do to help prevent falls?  Wear shoes that:  Do not have high heels.  Have rubber bottoms.  Are comfortable and fit you well.  Are closed at the toe. Do not wear sandals.  If you use a stepladder:  Make sure that it is fully  opened. Do not climb a closed stepladder.  Make sure that both sides of the stepladder are locked into place.  Ask someone to hold it for you, if possible.  Clearly mark and make sure that you can see:  Any grab bars or handrails.  First and last steps.  Where the edge of each step is.  Use tools that help you move around (mobility aids) if they are needed. These include:  Canes.  Walkers.  Scooters.  Crutches.  Turn on the lights when you go into a dark area. Replace any light bulbs as soon as they burn out.  Set up your furniture so you have a clear path. Avoid moving your furniture around.  If any of your floors are uneven, fix them.  If there are any pets around you, be aware of where they are.  Review your medicines with your doctor. Some medicines can make you feel dizzy. This can increase your chance of falling. Ask your doctor what other things that you can do to help prevent falls. This information is not intended to replace advice given to you by your health care provider. Make sure you discuss any questions you have with your health care provider. Document Released: 09/13/2009 Document Revised: 04/24/2016 Document Reviewed: 12/22/2014 Elsevier Interactive Patient Education  2017 [employer].

## 2021-03-28 NOTE — Progress Notes (Signed)
Subjective:   Jose Castro is a 87 y.o. male who presents for Medicare Annual/Subsequent preventive examination.  Virtual Visit via Telephone Note  I connected with  [name] on 03/28/21 at  1:30 PM EDT by telephone and verified that I am speaking with the correct person using two identifiers.  Location: Patient: home Provider: [place] Persons participating in the virtual visit: [place]   I discussed the limitations, risks, security and privacy concerns of performing an evaluation and management service by telephone and the availability of in person appointments. The patient expressed understanding and agreed to proceed.  Interactive audio and video telecommunications were attempted between this nurse and patient, however failed, due to patient having technical difficulties OR patient did not have access to video capability.  We continued and completed visit with audio only.  Some vital signs may be absent or patient reported.   Clemetine Marker, LPN    Review of Systems     Cardiac Risk Factors include: advanced age (>41men, >59 women);dyslipidemia;male gender;hypertension     Objective:    There were no vitals filed for this visit. There is no height or weight on file to calculate BMI.  Advanced Directives 03/28/2021 10/23/2020 09/24/2020 07/12/2020 06/30/2020 05/02/2020 05/01/2020  Does Patient Have a Medical Advance Directive? Yes No No No No Yes Yes  Type of Advance Directive [place] will Living will  Does patient want to make changes to medical advance directive? - - No - Patient declined No - Patient declined - No - Patient declined -  Copy of [place] in Chart? No - copy requested - - - - - -  Would patient like information on creating a medical advance directive? - No - Patient declined No - Patient declined - - - -    Current Medications (verified) Outpatient Encounter Medications as of  03/28/2021  Medication Sig  . acetaminophen (TYLENOL) 325 MG tablet Take 2 tablets (650 mg total) by mouth every 4 (four) hours as needed for mild pain (or temp > 37.5 C (99.5 F)). (Patient taking differently: Take 650 mg by mouth as needed for mild pain (or temp > 37.5 C (99.5 F)).)  . apixaban (ELIQUIS) 2.5 MG TABS tablet Take 1 tablet (2.5 mg total) by mouth 2 (two) times daily.  . Cholecalciferol (VITAMIN D3) 50 MCG (2000 UT) TABS Take 2,000 Units by mouth daily.  . Ensure Max Protein (ENSURE MAX PROTEIN) LIQD Take 330 mLs (11 oz total) by mouth 2 (two) times daily.  . finasteride (PROSCAR) 5 MG tablet Take 1 tablet (5 mg total) by mouth daily.  . metoprolol succinate (TOPROL XL) 25 MG 24 hr tablet Take 1 tablet (25 mg total) by mouth daily.  . Omega-3 Fatty Acids (FISH OIL) 1000 MG CAPS Take 1,000 mg by mouth daily.   . pantoprazole (PROTONIX) 40 MG tablet TAKE ONE TABLET EVERY DAY  . rosuvastatin (CRESTOR) 40 MG tablet Take 1 tablet (40 mg total) by mouth daily at 6 PM.  . ursodiol (ACTIGALL) 300 MG capsule Take 300 mg by mouth in the morning and at bedtime.   No facility-administered encounter medications on file as of 03/28/2021.    Allergies (verified) Penicillins   History: Past Medical History:  Diagnosis Date  . Allergy    Penicillin  . Arthritis   . Basal cell carcinoma   . BPH (benign prostatic hyperplasia)   . Chronic kidney disease  had a kidney stone which per family was a cyst that was removed  . Dysrhythmia   . GERD (gastroesophageal reflux disease)   . History of kidney stones   . Hyperlipidemia   . Hypertension   . Left-sided low back pain with left-sided sciatica   . Lung mass   . Splenic vein thrombosis   . Stroke ([place]) 05/22/2019  . Thrombosis 12/2015   mural  area and no notation of heart attack   Past Surgical History:  Procedure Laterality Date  . APPENDECTOMY    . CATARACT EXTRACTION, BILATERAL    . CYSTOSCOPY WITH INSERTION OF UROLIFT    .  ENDOSCOPIC RETROGRADE CHOLANGIOPANCREATOGRAPHY (ERCP) WITH PROPOFOL N/A 05/11/2020   Procedure: ENDOSCOPIC RETROGRADE CHOLANGIOPANCREATOGRAPHY (ERCP) WITH PROPOFOL;  Surgeon: [name], MD;  Location: [facility] ENDOSCOPY;  Service: Endoscopy;  Laterality: N/A;  . ERCP N/A 10/23/2020   Procedure: ENDOSCOPIC RETROGRADE CHOLANGIOPANCREATOGRAPHY (ERCP);  Surgeon: [name], MD;  Location: [facility] ENDOSCOPY;  Service: Endoscopy;  Laterality: N/A;  . EYE SURGERY  08/2018  . IR EXCHANGE BILIARY DRAIN  05/04/2020  . KIDNEY STONE SURGERY    . KNEE SURGERY    . PACEMAKER INSERTION N/A 07/13/2018   Procedure: INSERTION PACEMAKER-DUEL CHAMBER INITIAL IMPLANT;  Surgeon: Isaias Cowman, MD;  Location: [facility] ORS;  Service: Cardiovascular;  Laterality: N/A;  . SPINE SURGERY     Family History  Problem Relation Age of Onset  . Cancer Brother        bladder cancer with mets  . Heart disease Mother   . Aortic aneurysm Mother   . Heart attack Maternal Aunt   . Heart attack Maternal Uncle    Social History   Socioeconomic History  . Marital status: Widowed    Spouse name: Not on file  . Number of children: 6  . Years of education: Not on file  . Highest education level: Some college, no degree  Occupational History  . 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 Smoking status: Former Smoker    Packs/day: 0.00    Years: 16.00    Pack years: 0.00    Types: Cigarettes    Start date: 10/21/1944    Quit date: 10/21/1960    Years since quitting: 60.4  . Smokeless tobacco: Never Used  Vaping Use  . Vaping Use: Never used  Substance and Sexual Activity  . Alcohol use: Yes    Alcohol/week: 3.0 standard drinks    Types: 3 Standard drinks or equivalent per week    Comment: once a week when he goes out to dinner-socially  . Drug use: No  . Sexual activity: Yes    Partners: Female  Other Topics Concern  . Not on file  Social History Narrative   Widow, lives  alone but 4 of his children rotate staying with him since July 2020 due to stroke and he is not left alone but does most things independently.       Smoke 15-20 years; quit 60 years ago. Rare alcohol. 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    Social Determinants of Health   Financial Resource Strain: Low Risk   . Difficulty of Paying Living Expenses: Not hard at all  Food Insecurity: No Food Insecurity  . Worried About Charity fundraiser in the Last Year: Never true  . Ran Out of Food in the Last Year: Never true  Transportation Needs: No Transportation Needs  . Lack of  Transportation (Medical): No  . Lack of Transportation (Non-Medical): No  Physical Activity: Inactive  . Days of Exercise per Week: 0 days  . Minutes of Exercise per Session: 0 min  Stress: No Stress Concern Present  . Feeling of Stress : Not at all  Social Connections: Moderately Integrated  . Frequency of Communication with Friends and Family: More than three times a week  . Frequency of Social Gatherings with Friends and Family: More than three times a week  . Attends Religious Services: More than 4 times per year  . Active Member of Clubs or Organizations: Yes  . Attends Archivist Meetings: More than 4 times per year  . Marital Status: Widowed    Tobacco Counseling Counseling given: Not Answered   Clinical Intake:  Pre-visit preparation completed: Yes  Pain : No/denies pain     Nutritional Risks: None Diabetes: No  How often do you need to have someone help you when you read instructions, pamphlets, or other written materials from your doctor or pharmacy?: 1 - Never    Interpreter Needed?: No  Information entered by :: Clemetine Marker LPN   Activities of Daily Living In your present state of health, do you have any difficulty performing the following activities: 03/28/2021 03/11/2021  Hearing? Tempie Donning  Comment has hearing aids -  Vision? N N  Difficulty concentrating or making decisions? Tempie Donning  Walking or  climbing stairs? Y Y  Dressing or bathing? Y Y  Doing errands, shopping? Tempie Donning  Preparing Food and eating ? N -  Using the Toilet? N -  In the past six months, have you accidently leaked urine? Y -  Do you have problems with loss of bowel control? N -  Managing your Medications? Y -  Managing your Finances? Y -  Housekeeping or managing your Housekeeping? N -  Some recent data might be hidden    Patient Care Team: Steele Sizer, MD as PCP - General (Family Medicine) Royston Cowper, MD as Consulting Physician (Urology) Yolonda Kida, MD as Consulting Physician (Cardiology) Cammie Sickle, MD as Consulting Physician (Internal Medicine) Delana Meyer, Dolores Lory, MD as Consulting Physician (Vascular Surgery) Ralene Bathe, MD as Consulting Physician (Dermatology) Garvin Fila, MD as Consulting Physician (Neurology) Noreene Filbert, MD as Radiation Oncologist (Radiation Oncology)  Indicate any recent Medical Services you may have received from other than [facility] providers in the past year (date may be approximate).     Assessment:   This is a routine wellness examination for [place].  Hearing/Vision screen  Hearing Screening   125Hz  250Hz  500Hz  1000Hz  2000Hz  3000Hz  4000Hz  6000Hz  8000Hz   Right ear:           Left ear:           Comments: Pt has hearing aids. Doesn't like to wear them.   Vision Screening Comments: Annual vision screenings done at [place] issues and exercise activities discussed: Current Exercise Habits: The patient does not participate in regular exercise at present, Exercise limited by: orthopedic condition(s)  Goals    . DIET - INCREASE WATER INTAKE     Recommend drinking 6-8 glasses of water per day.       Depression Screen PHQ 2/9 Scores 03/28/2021 03/11/2021 09/07/2020 06/26/2020 05/02/2020 03/06/2020 02/07/2020  PHQ - 2 Score 0 0 0 0 0 0 0  PHQ- 9 Score - - - 0 0 0 -    Fall Risk Fall Risk  03/28/2021 03/11/2021 09/07/2020  06/26/2020 05/02/2020   Falls in the past year? 0 0 0 0 0  Number falls in past yr: 0 0 0 0 -  Injury with Fall? 0 0 0 0 -  Risk for fall due to : Impaired balance/gait - - - Impaired balance/gait;Impaired mobility  Follow up Falls prevention discussed - - - -    FALL RISK PREVENTION PERTAINING TO THE HOME:  Any stairs in or around the home? Yes  If so, are there any without handrails? No  Home free of loose throw rugs in walkways, pet beds, electrical cords, etc? Yes  Adequate lighting in your home to reduce risk of falls? Yes   ASSISTIVE DEVICES UTILIZED TO PREVENT FALLS:  Life alert? Yes  Use of a cane, walker or w/c? Yes  Grab bars in the bathroom? Yes  Shower chair or bench in shower? Yes  Elevated toilet seat or a handicapped toilet? Yes   TIMED UP AND GO:  Was the test performed? No . Telephonic visit.   Cognitive Function:  Normal cognitive status assessed by direct observation by this Nurse Health Advisor. No abnormalities found.       6CIT Screen 02/01/2019  What Year? 0 points  What month? 0 points  What time? 0 points  Count back from 20 0 points  Months in reverse 0 points  Repeat phrase 0 points  Total Score 0    Immunizations Immunization History  Administered Date(s) Administered  . Fluad Quad(high Dose 65+) 09/07/2020  . Moderna Sars-Covid-2 Vaccination 01/23/2020, 02/21/2020  . Pneumococcal Conjugate-13 02/06/2014  . Pneumococcal Polysaccharide-23 06/08/2012  . Tdap 06/08/2012    TDAP status: Up to date  Flu Vaccine status: Up to date  Pneumococcal vaccine status: Up to date  Covid-19 vaccine status: Completed vaccines  Qualifies for Shingles Vaccine? No    Screening Tests Health Maintenance  Topic Date Due  . COVID-19 Vaccine (3 - Moderna risk 4-dose series) 03/20/2020  . INFLUENZA VACCINE  07/01/2021  . TETANUS/TDAP  06/08/2022  . PNA vac Low Risk Adult  Completed  . HPV VACCINES  Aged Out    Health Maintenance  Health Maintenance Due  Topic Date  Due  . COVID-19 Vaccine (3 - Moderna risk 4-dose series) 03/20/2020    Colorectal cancer screening: No longer required.   Lung Cancer Screening: (Low Dose CT Chest recommended if Age 46-80 years, 30 pack-year currently smoking OR have quit w/in 15years.) does not qualify.   Additional Screening:  Hepatitis C Screening: does not qualify.  Vision Screening: Recommended annual ophthalmology exams for early detection of glaucoma and other disorders of the eye. Is the patient up to date with their annual eye exam?  Yes  Who is the provider or what is the name of the office in which the patient attends annual eye exams? [facility].   Dental Screening: Recommended annual dental exams for proper oral hygiene  Community Resource Referral / Chronic Care Management: CRR required this visit?  No   CCM required this visit?  No      Plan:     I have personally reviewed and noted the following in the patient's chart:   . Medical and social history . Use of alcohol, tobacco or illicit drugs  . Current medications and supplements . Functional ability and status . Nutritional status . Physical activity . Advanced directives . List of other physicians . Hospitalizations, surgeries, and ER visits in previous 12 months . Vitals . Screenings to include cognitive, depression, and  falls . Referrals and appointments  In addition, I have reviewed and discussed with patient certain preventive protocols, quality metrics, and best practice recommendations. A written personalized care plan for preventive services as well as general preventive health recommendations were provided to patient.     Clemetine Marker, LPN   12/03/1592   Nurse Notes: none

## 2021-04-01 ENCOUNTER — Ambulatory Visit: Payer: Medicare Other | Attending: Family Medicine

## 2021-04-01 ENCOUNTER — Other Ambulatory Visit: Payer: Self-pay

## 2021-04-01 DIAGNOSIS — R269 Unspecified abnormalities of gait and mobility: Secondary | ICD-10-CM | POA: Insufficient documentation

## 2021-04-01 DIAGNOSIS — R262 Difficulty in walking, not elsewhere classified: Secondary | ICD-10-CM | POA: Diagnosis present

## 2021-04-01 DIAGNOSIS — R2681 Unsteadiness on feet: Secondary | ICD-10-CM | POA: Diagnosis present

## 2021-04-01 DIAGNOSIS — R2689 Other abnormalities of gait and mobility: Secondary | ICD-10-CM | POA: Diagnosis present

## 2021-04-01 DIAGNOSIS — R278 Other lack of coordination: Secondary | ICD-10-CM | POA: Insufficient documentation

## 2021-04-01 DIAGNOSIS — M6281 Muscle weakness (generalized): Secondary | ICD-10-CM | POA: Insufficient documentation

## 2021-04-01 NOTE — Therapy (Signed)
[place] MAIN [facility] SERVICES [address] [place], [place], [address] Phone: [phone]   Fax:  [phone]  Physical Therapy Treatment  Patient Details  Name: Jose Castro MRN: [id number] Date of Birth: 06-16-1926 Referring Provider (PT): Dr. Ancil Boozer   Encounter Date: 04/01/2021   PT End of Session - 04/01/21 1528    Visit Number 23    Number of Visits 66    Date for PT Re-Evaluation 05/13/21    Authorization Type eval: 09/20/20, Progress note 01/02/2021, PN on 0/05/3015, Recert = 0/08/9322-5/57/3220; PN on 4/202022    PT Start Time 1518    PT Stop Time 1559    PT Time Calculation (min) 41 min    Equipment Utilized During Treatment Gait belt    Activity Tolerance Patient tolerated treatment well    Behavior During Therapy [facility] for tasks assessed/performed           Past Medical History:  Diagnosis Date  . Allergy    Penicillin  . Arthritis   . Basal cell carcinoma   . BPH (benign prostatic hyperplasia)   . Chronic kidney disease    had a kidney stone which per family was a cyst that was removed  . Dysrhythmia   . GERD (gastroesophageal reflux disease)   . History of kidney stones   . Hyperlipidemia   . Hypertension   . Left-sided low back pain with left-sided sciatica   . Lung mass   . Splenic vein thrombosis   . Stroke ([place]) 05/22/2019  . Thrombosis 12/2015   mural  area and no notation of heart attack    Past Surgical History:  Procedure Laterality Date  . APPENDECTOMY    . CATARACT EXTRACTION, BILATERAL    . CYSTOSCOPY WITH INSERTION OF UROLIFT    . ENDOSCOPIC RETROGRADE CHOLANGIOPANCREATOGRAPHY (ERCP) WITH PROPOFOL N/A 05/11/2020   Procedure: ENDOSCOPIC RETROGRADE CHOLANGIOPANCREATOGRAPHY (ERCP) WITH PROPOFOL;  Surgeon: [name], MD;  Location: [facility] ENDOSCOPY;  Service: Endoscopy;  Laterality: N/A;  . ERCP N/A 10/23/2020   Procedure: ENDOSCOPIC RETROGRADE CHOLANGIOPANCREATOGRAPHY (ERCP);  Surgeon: [name], MD;   Location: [facility] ENDOSCOPY;  Service: Endoscopy;  Laterality: N/A;  . EYE SURGERY  08/2018  . IR EXCHANGE BILIARY DRAIN  05/04/2020  . KIDNEY STONE SURGERY    . KNEE SURGERY    . PACEMAKER INSERTION N/A 07/13/2018   Procedure: INSERTION PACEMAKER-DUEL CHAMBER INITIAL IMPLANT;  Surgeon: Isaias Cowman, MD;  Location: [facility] ORS;  Service: Cardiovascular;  Laterality: N/A;  . SPINE SURGERY      There were no vitals filed for this visit.   Subjective Assessment - 04/01/21 1526    Subjective Patient reports he fell backward on Friday- did not hit the ground but hit his back.    Patient is accompained by: Family member    Pertinent History Pt referred for deconditioning and gait instability. He has had a decline since he last finished therapy. Pt had a hospital admission from 05/02/20 to 05/14/20 for acute cholecystitis and is now s/p cholecystostomy drain placement. Drain is still in place and pt is waiting to hear when they plan to remove it. Pt discharged to SNF and then returned home. In addition pt underwent radiation therapy for a RUL lung mass/cancer and had his last radiation treatment in September of 2021. He reports that he has continued to walk at home as his primary exercise. Pt was previously seen at this clinic for PT/OT due to weakness/imbalance s/p CVA. Prior history from 09/21/19:  Pt is a 87 year old male who presents with imbalance and difficulty with gait following a right frontal CVA with left hemiparesis on 05/22/19.  He has completed inpatient and home health PT, and now presents for OP PT.  He was discharged from inpatient rehab at a supervision level of assistance, ambulating with a RW.  His family notes that he is impulsive and will try to ambulate without his RW at home.  Family is currently providing 24/7 supervision.    Limitations House hold activities;Walking    How long can you sit comfortably? no limitations    How long can you walk comfortably? he requires a RW    Patient  Stated Goals Pt wants to be able to walk his granddaughter down the aisle at her wedding November 2022    Currently in Pain? No/denies            Interventions:   Nustep LE only x 5 min= 0.17 mi with cues to maintain SPM > 60  Prostretch focusing on anterior/posterior weight shifting- 2 sets of 25 reps. Difficulty with posterior lean with impaired righting ankle/hip strategy- 2-3 episodes of Loss of balance requiring Min A to keep from falling.  A/P airex pad - improved ability to weight shift vs. The prostretch- continues to require close CGA.  Step up with 4Lb onto airex pad 2 sets of 10 reps - with light touch to no UE support- Patient with mild unsteadiness requiring close CGA.   Ham curls 7.5 lb. Seated BLE- using matrix cable system- 3 sets of 10 reps. VC to pull back for max contraction.  Education provided throughout session via VC/TC and demonstration to facilitate movement at target joints and correct muscle activation for all testing and exercises performed.  Clinical Impression: Patient performed standing weight shifting activities requiring more physical assistance with difficulty with righting reactions posteriorly today. He was able to improve step up using resistive ankle weights without loss of balance today. Pt will benefit from further skilled PT to improve BLE strength, balance and functional mobility                            PT Short Term Goals - 01/21/21 1438      PT SHORT TERM GOAL #1   Title Pt will be independent with HEP in order to improve strength and balance in order to decrease fall risk and improve function at home.    Baseline 01/02/2021- Patient verbalize understanding of walking and seated exercises for home program. 01/21/21 patient has been more active and understands HEP.    Time 6    Period Weeks    Status Achieved    Target Date 01/10/21             PT Long Term Goals - 03/20/21 1136      PT LONG TERM GOAL #1    Title Pt will improve BERG by at least 3 points in order to demonstrate clinically significant improvement in balance.    Baseline 09/20/20: 32/56 (Previous discharge: 03/06/20: 46/56), 12/14: 32/56, 1/13: 31/56, 01/16/21 35/56, 02/06/2021=36/56. 02/18/2021= 37/56.03/20/2021- Deferred secondary to left knee aching- will attempt next visit.    Time 12    Period Weeks    Status On-going    Target Date 05/13/21      PT LONG TERM GOAL #2   Title Pt will decrease TUG to below 14 seconds/decrease in order to demonstrate decreased fall risk.  Baseline 09/20/20: 19.2s, 12/14: 17.48, 1/13: 16.65 sec. 01/02/2021=15.75 sec using 4WW, 01/21/21  14.37 seconds. 02/06/2021= 14.92 sec using 4WW. 02/18/2021= 14.5 sec using 4WW. 03/20/2021- Deferred secondary to left knee aching- will attempt next visit.    Time 12    Period Weeks    Status On-going    Target Date 05/13/21      PT LONG TERM GOAL #3   Title Pt will decrease 5TSTS by at least 3 seconds in order to demonstrate clinically significant improvement in LE strength.    Baseline 09/20/20: 23.1s with minA+1 for strength and balance (Previous discharge 03/06/20: 16.1s), 12/14: 20 sec, 1/13: 22 sec.   01/02/2021= 16.05 sec with light UE support. 01/21/21 01/21/21 37.90 seconds without UE, with UE support 12.93 seconds: 02/06/2021= with 1 UE 17.93 sec. 02/18/2021= 21.33 sec without UE support.    Time 12    Period Weeks    Status On-going    Target Date 05/13/21      PT LONG TERM GOAL #4   Title Pt will increase his self-selected 10MWT to >0.74 m/s in order to demonstrate improvement in gait speed and improved community ambulation    Baseline 09/20/20: self-selected: 14.4s = 0.69 m/s with rollator (Previous discharge 03/06/20: self-selected: 13.6s = 0.74 m/s), 12/14: 0.75 m/s, 1/13: 0.78 m/s. 01/02/2021= 0.85 m/s using 4WW, 01/16/21 with 4 WW .99 m/s. 02/06/2021= self selected = 0.93 m/s with 9XI    Time 4    Period Weeks    Status Achieved      PT LONG TERM GOAL #5    Title Patient will increase six minute walk test distance to >1000 for progression to community ambulator and improve gait ability    Baseline 01/10/20  620 ft with rollator, 01/21/21  965 ft; 02/06/2021= 910 feet with 4WW.  02/18/2021= 950 feet with 4WW    Time 12    Period Weeks    Status On-going    Target Date 05/13/21                 Plan - 04/01/21 1528    Clinical Impression Statement Patient performed standing weight shifting activities requiring more physical assistance with difficulty with righting reactions posteriorly today. He was able to improve step up using resistive ankle weights without loss of balance today. Pt will benefit from further skilled PT to improve BLE strength, balance and functional mobility    Personal Factors and Comorbidities Age;Comorbidity 3+    Comorbidities HTN, CVA, cholecystitis with cholecystostomy drain    Examination-Activity Limitations Bend;Lift;Squat;Locomotion Level;Stairs;Stand    Examination-Participation Restrictions Community Activity;Driving;Laundry;Meal Prep;Shop    Stability/Clinical Decision Making Unstable/Unpredictable    Rehab Potential Fair    PT Frequency 2x / week    PT Duration 4 weeks    PT Treatment/Interventions ADLs/Self Care Home Management;Cryotherapy;Electrical Stimulation;Iontophoresis 4mg /ml Dexamethasone;Moist Heat;Traction;Ultrasound;DME Instruction;Gait training;Stair training;Therapeutic activities;Therapeutic exercise;Functional mobility training;Balance training;Neuromuscular re-education;Patient/family education;Manual techniques;Dry needling;Vestibular;Joint Manipulations;Spinal Manipulations;Aquatic Therapy    PT Next Visit Plan Continue to focus on LE strengthening and balance, dynamic balance tasks    Consulted and Agree with Plan of Care Patient;Family member/caregiver           Patient will benefit from skilled therapeutic intervention in order to improve the following deficits and impairments:  Abnormal  gait,Decreased balance,Decreased mobility,Difficulty walking,Decreased knowledge of precautions,Decreased safety awareness,Decreased activity tolerance,Decreased strength,Decreased endurance  Visit Diagnosis: Abnormality of gait and mobility  Difficulty in walking, not elsewhere classified  Muscle weakness (generalized)  Unsteadiness on feet  Problem List Patient Active Problem List   Diagnosis Date Noted  . Moderate protein-calorie malnutrition ([place]) 03/11/2021  . Hemiparesis affecting left side as late effect of cerebrovascular accident (CVA) ([place]) 03/11/2021  . Aneurysm, aorta, thoracic ([place]) 12/21/2020  . Calculus of bile duct without cholecystitis and without obstruction   . Non-small cell carcinoma of lung, right ([place]) 09/07/2020  . Mass of upper lobe of right lung 07/13/2020  . Calculus of bile duct with cholecystitis without obstruction   . Abnormal findings on imaging of biliary tract   . Acute cholecystitis 05/02/2020  . Chronic venous insufficiency 12/05/2019  . 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 CKD (chronic kidney disease), stage II   . New onset atrial fibrillation ([place])   . 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 Goals of care, counseling/discussion   . Palliative care by specialist   . DNR (do not resuscitate) discussion   . History of CVA (cerebrovascular accident) 05/22/2019  . Persistent proteinuria 07/30/2018  . 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 Chronic kidney disease, stage III (moderate) ([place]) 06/15/2018  . Leg pain 05/06/2018  . Lymphedema 05/06/2018  . Coagulopathy ([place]) 07/07/2017  . Lumbar spondylosis 03/10/2017  . Elevated uric acid in blood 11/20/2016  . BPH (benign prostatic hyperplasia) 05/29/2016  . Peripheral vascular disease of lower extremity ([place]) 03/03/2016  . Gallstone 12/31/2015  . 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 Atherosclerosis of aorta ([place])  11/22/2015  . Carotid artery narrowing 11/22/2015  . Diverticulosis of colon 11/22/2015  . Decreased creatinine clearance 11/22/2015  . Arthritis, degenerative 11/22/2015  . Lactose intolerance 11/22/2015  . Basal cell carcinoma 11/22/2015  . Essential hypertension 06/21/2015  . Hyperlipemia 06/21/2015  . GERD (gastroesophageal reflux disease) 06/21/2015  . Calculus of kidney 11/18/2013    Lewis Moccasin, PT 04/02/2021, 12:48 PM  [place] MAIN [facility] SERVICES [address] [place], [place], [address] Phone: [phone]   Fax:  [phone]  Name: MOOSA BUECHE MRN: [id number] Date of Birth: [birth year]

## 2021-04-03 ENCOUNTER — Other Ambulatory Visit: Payer: Self-pay

## 2021-04-03 ENCOUNTER — Ambulatory Visit: Payer: Medicare Other

## 2021-04-03 DIAGNOSIS — R278 Other lack of coordination: Secondary | ICD-10-CM

## 2021-04-03 DIAGNOSIS — R262 Difficulty in walking, not elsewhere classified: Secondary | ICD-10-CM

## 2021-04-03 DIAGNOSIS — R269 Unspecified abnormalities of gait and mobility: Secondary | ICD-10-CM

## 2021-04-03 DIAGNOSIS — M6281 Muscle weakness (generalized): Secondary | ICD-10-CM

## 2021-04-03 NOTE — Therapy (Signed)
[place] MAIN [facility] SERVICES [address] [place], [place], [address] Phone: [phone]   Fax:  [phone]  Physical Therapy Treatment  Patient Details  Name: Jose Castro MRN: [id number] Date of Birth: 27-Apr-1926 Referring Provider (PT): Dr. Ancil Boozer   Encounter Date: 04/03/2021   PT End of Session - 04/03/21 1315    Visit Number 24    Number of Visits 30    Date for PT Re-Evaluation 05/13/21    Authorization Type eval: 09/20/20, Progress note 01/02/2021, PN on 08/05/7095, Recert = 2/83/6629-4/76/5465; PN on 4/202022    PT Start Time 1515    PT Stop Time 1559    PT Time Calculation (min) 44 min    Equipment Utilized During Treatment Gait belt    Activity Tolerance Patient tolerated treatment well    Behavior During Therapy [facility] for tasks assessed/performed           Past Medical History:  Diagnosis Date  . Allergy    Penicillin  . Arthritis   . Basal cell carcinoma   . BPH (benign prostatic hyperplasia)   . Chronic kidney disease    had a kidney stone which per family was a cyst that was removed  . Dysrhythmia   . GERD (gastroesophageal reflux disease)   . History of kidney stones   . Hyperlipidemia   . Hypertension   . Left-sided low back pain with left-sided sciatica   . Lung mass   . Splenic vein thrombosis   . Stroke ([place]) 05/22/2019  . Thrombosis 12/2015   mural  area and no notation of heart attack    Past Surgical History:  Procedure Laterality Date  . APPENDECTOMY    . CATARACT EXTRACTION, BILATERAL    . CYSTOSCOPY WITH INSERTION OF UROLIFT    . ENDOSCOPIC RETROGRADE CHOLANGIOPANCREATOGRAPHY (ERCP) WITH PROPOFOL N/A 05/11/2020   Procedure: ENDOSCOPIC RETROGRADE CHOLANGIOPANCREATOGRAPHY (ERCP) WITH PROPOFOL;  Surgeon: [name], MD;  Location: [facility] ENDOSCOPY;  Service: Endoscopy;  Laterality: N/A;  . ERCP N/A 10/23/2020   Procedure: ENDOSCOPIC RETROGRADE CHOLANGIOPANCREATOGRAPHY (ERCP);  Surgeon: [name], MD;   Location: [facility] ENDOSCOPY;  Service: Endoscopy;  Laterality: N/A;  . EYE SURGERY  08/2018  . IR EXCHANGE BILIARY DRAIN  05/04/2020  . KIDNEY STONE SURGERY    . KNEE SURGERY    . PACEMAKER INSERTION N/A 07/13/2018   Procedure: INSERTION PACEMAKER-DUEL CHAMBER INITIAL IMPLANT;  Surgeon: Isaias Cowman, MD;  Location: [facility] ORS;  Service: Cardiovascular;  Laterality: N/A;  . SPINE SURGERY      There were no vitals filed for this visit.   Subjective Assessment - 04/03/21 1314    Subjective Patient reports doing well today without any difficulty.    Patient is accompained by: Family member    Pertinent History Pt referred for deconditioning and gait instability. He has had a decline since he last finished therapy. Pt had a hospital admission from 05/02/20 to 05/14/20 for acute cholecystitis and is now s/p cholecystostomy drain placement. Drain is still in place and pt is waiting to hear when they plan to remove it. Pt discharged to SNF and then returned home. In addition pt underwent radiation therapy for a RUL lung mass/cancer and had his last radiation treatment in September of 2021. He reports that he has continued to walk at home as his primary exercise. Pt was previously seen at this clinic for PT/OT due to weakness/imbalance s/p CVA. Prior history from 09/21/19: Pt is a 87 year old male who presents with  imbalance and difficulty with gait following a right frontal CVA with left hemiparesis on 05/22/19.  He has completed inpatient and home health PT, and now presents for OP PT.  He was discharged from inpatient rehab at a supervision level of assistance, ambulating with a RW.  His family notes that he is impulsive and will try to ambulate without his RW at home.  Family is currently providing 24/7 supervision.    Limitations House hold activities;Walking    How long can you sit comfortably? no limitations    How long can you walk comfortably? he requires a RW    Patient Stated Goals Pt wants to be  able to walk his granddaughter down the aisle at her wedding November 2022    Currently in Pain? No/denies          Supine LE/trunk stretching: PROM - lower trunk rotation - hold 20 sec x 3 sets- Patient reports feeling "Tight" initially. Verbalized  Double knee to chest- hold 20 sec x 3 sets each leg Bridging x 10 reps with VC for correct form.  Clamshell B LE 2 sets of 12 reps - VC and visual demo for correct form Hip Abd- Patient able to lift LE approx 15-20" off mat Sit to stand- Focusing on forward trunk lean x 10 reps Gait with 4WW- Focusing on standing erect and taking good reciprocal steps- 200 feet today. Patient able to correct posture with VC yet unable to maintain and continued to require VC to stay close to walker.  Wall posture w PVC- Standing- Holding onto PVC pipe raising overhead. Lumbar flex with PVC- Instructed to ben forward then stand erect holding onto pvc pipe- starting with pipe at knees (bending forward) then pipe up toward waist to focus on lumbar ext.   Seated ham curls using 7.5# matrix cable system- 2 sets of 10 reps. Mild difficulty with left LE yet able to complete today.   Clinical Impression: Patient performed well with lumbar stretching- reporting feeling looser after stretching and able to fully participate with resistive LE strengthening today. He was responsive to cues with gait yet due to kyphotic posture and decreased step length- unable to stand erect requiring cues to keep walker close. Pt will benefit from further skilled PT to improve BLE strength, balance and functional mobility                         PT Education - 04/04/21 1315    Education Details specific exercise form    Person(s) Educated Patient    Methods Explanation;Demonstration;Tactile cues;Verbal cues    Comprehension Verbalized understanding;Returned demonstration;Verbal cues required;Need further instruction            PT Short Term Goals - 01/21/21 1438       PT SHORT TERM GOAL #1   Title Pt will be independent with HEP in order to improve strength and balance in order to decrease fall risk and improve function at home.    Baseline 01/02/2021- Patient verbalize understanding of walking and seated exercises for home program. 01/21/21 patient has been more active and understands HEP.    Time 6    Period Weeks    Status Achieved    Target Date 01/10/21             PT Long Term Goals - 03/20/21 1136      PT LONG TERM GOAL #1   Title Pt will improve BERG by at least 3 points  in order to demonstrate clinically significant improvement in balance.    Baseline 09/20/20: 32/56 (Previous discharge: 03/06/20: 46/56), 12/14: 32/56, 1/13: 31/56, 01/16/21 35/56, 02/06/2021=36/56. 02/18/2021= 37/56.03/20/2021- Deferred secondary to left knee aching- will attempt next visit.    Time 12    Period Weeks    Status On-going    Target Date 05/13/21      PT LONG TERM GOAL #2   Title Pt will decrease TUG to below 14 seconds/decrease in order to demonstrate decreased fall risk.    Baseline 09/20/20: 19.2s, 12/14: 17.48, 1/13: 16.65 sec. 01/02/2021=15.75 sec using 4WW, 01/21/21  14.37 seconds. 02/06/2021= 14.92 sec using 4WW. 02/18/2021= 14.5 sec using 4WW. 03/20/2021- Deferred secondary to left knee aching- will attempt next visit.    Time 12    Period Weeks    Status On-going    Target Date 05/13/21      PT LONG TERM GOAL #3   Title Pt will decrease 5TSTS by at least 3 seconds in order to demonstrate clinically significant improvement in LE strength.    Baseline 09/20/20: 23.1s with minA+1 for strength and balance (Previous discharge 03/06/20: 16.1s), 12/14: 20 sec, 1/13: 22 sec.   01/02/2021= 16.05 sec with light UE support. 01/21/21 01/21/21 37.90 seconds without UE, with UE support 12.93 seconds: 02/06/2021= with 1 UE 17.93 sec. 02/18/2021= 21.33 sec without UE support.    Time 12    Period Weeks    Status On-going    Target Date 05/13/21      PT LONG TERM GOAL #4    Title Pt will increase his self-selected 10MWT to >0.74 m/s in order to demonstrate improvement in gait speed and improved community ambulation    Baseline 09/20/20: self-selected: 14.4s = 0.69 m/s with rollator (Previous discharge 03/06/20: self-selected: 13.6s = 0.74 m/s), 12/14: 0.75 m/s, 1/13: 0.78 m/s. 01/02/2021= 0.85 m/s using 4WW, 01/16/21 with 4 WW .99 m/s. 02/06/2021= self selected = 0.93 m/s with 7SV    Time 4    Period Weeks    Status Achieved      PT LONG TERM GOAL #5   Title Patient will increase six minute walk test distance to >1000 for progression to community ambulator and improve gait ability    Baseline 01/10/20  620 ft with rollator, 01/21/21  965 ft; 02/06/2021= 910 feet with 4WW.  02/18/2021= 950 feet with 4WW    Time 12    Period Weeks    Status On-going    Target Date 05/13/21                 Plan - 04/03/21 1316    Clinical Impression Statement Patient performed well with lumbar stretching- reporting feeling looser after stretching and able to fully participate with resistive LE strengthening today. He was responsive to cues with gait yet due to kyphotic posture and decreased step length- unable to stand erect requiring cues to keep walker close. Pt will benefit from further skilled PT to improve BLE strength, balance and functional mobility    Personal Factors and Comorbidities Age;Comorbidity 3+    Comorbidities HTN, CVA, cholecystitis with cholecystostomy drain    Examination-Activity Limitations Bend;Lift;Squat;Locomotion Level;Stairs;Stand    Examination-Participation Restrictions Community Activity;Driving;Laundry;Meal Prep;Shop    Stability/Clinical Decision Making Unstable/Unpredictable    Rehab Potential Fair    PT Frequency 2x / week    PT Duration 12 weeks    PT Treatment/Interventions ADLs/Self Care Home Management;Cryotherapy;Electrical Stimulation;Iontophoresis 4mg /ml Dexamethasone;Moist Heat;Traction;Ultrasound;DME Instruction;Gait training;Stair  training;Therapeutic activities;Therapeutic exercise;Functional mobility training;Balance training;Neuromuscular  re-education;Patient/family education;Manual techniques;Dry needling;Vestibular;Joint Manipulations;Spinal Manipulations;Aquatic Therapy    PT Next Visit Plan Continue to focus on LE strengthening and balance, dynamic balance tasks    Consulted and Agree with Plan of Care Patient;Family member/caregiver           Patient will benefit from skilled therapeutic intervention in order to improve the following deficits and impairments:  Abnormal gait,Decreased balance,Decreased mobility,Difficulty walking,Decreased knowledge of precautions,Decreased safety awareness,Decreased activity tolerance,Decreased strength,Decreased endurance  Visit Diagnosis: Abnormality of gait and mobility  Difficulty in walking, not elsewhere classified  Muscle weakness (generalized)  Other lack of coordination     Problem List Patient Active Problem List   Diagnosis Date Noted  . Moderate protein-calorie malnutrition ([place]) 03/11/2021  . Hemiparesis affecting left side as late effect of cerebrovascular accident (CVA) ([place]) 03/11/2021  . Aneurysm, aorta, thoracic ([place]) 12/21/2020  . Calculus of bile duct without cholecystitis and without obstruction   . Non-small cell carcinoma of lung, right ([place]) 09/07/2020  . Mass of upper lobe of right lung 07/13/2020  . Calculus of bile duct with cholecystitis without obstruction   . Abnormal findings on imaging of biliary tract   . Acute cholecystitis 05/02/2020  . Chronic venous insufficiency 12/05/2019  . 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 CKD (chronic kidney disease), stage II   . New onset atrial fibrillation ([place])   . 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 Goals of care, counseling/discussion   . Palliative care by specialist   . DNR (do not resuscitate) discussion   . History of CVA (cerebrovascular accident)  05/22/2019  . Persistent proteinuria 07/30/2018  . 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 Chronic kidney disease, stage III (moderate) ([place]) 06/15/2018  . Leg pain 05/06/2018  . Lymphedema 05/06/2018  . Coagulopathy ([place]) 07/07/2017  . Lumbar spondylosis 03/10/2017  . Elevated uric acid in blood 11/20/2016  . BPH (benign prostatic hyperplasia) 05/29/2016  . Peripheral vascular disease of lower extremity ([place]) 03/03/2016  . Gallstone 12/31/2015  . 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 Atherosclerosis of aorta ([place]) 11/22/2015  . Carotid artery narrowing 11/22/2015  . Diverticulosis of colon 11/22/2015  . Decreased creatinine clearance 11/22/2015  . Arthritis, degenerative 11/22/2015  . Lactose intolerance 11/22/2015  . Basal cell carcinoma 11/22/2015  . Essential hypertension 06/21/2015  . Hyperlipemia 06/21/2015  . GERD (gastroesophageal reflux disease) 06/21/2015  . Calculus of kidney 11/18/2013    Lewis Moccasin, PT 04/04/2021, 1:31 PM  [place] MAIN [facility] SERVICES [address] [place], [place], [address] Phone: [phone]   Fax:  [phone]  Name: Jose Castro MRN: [id number] Date of Birth: June 17, 1926

## 2021-04-11 ENCOUNTER — Ambulatory Visit: Payer: Medicare Other

## 2021-04-11 ENCOUNTER — Other Ambulatory Visit: Payer: Self-pay

## 2021-04-11 DIAGNOSIS — R278 Other lack of coordination: Secondary | ICD-10-CM

## 2021-04-11 DIAGNOSIS — R269 Unspecified abnormalities of gait and mobility: Secondary | ICD-10-CM

## 2021-04-11 DIAGNOSIS — M6281 Muscle weakness (generalized): Secondary | ICD-10-CM

## 2021-04-11 DIAGNOSIS — R262 Difficulty in walking, not elsewhere classified: Secondary | ICD-10-CM

## 2021-04-11 NOTE — Therapy (Signed)
[facility] MAIN [facility] SERVICES [address] [place], [place], [address] Phone: [phone]   Fax:  [phone]  Physical Therapy Treatment  Patient Details  Name: Jose Castro MRN: [id number] Date of Birth: 19-Aug-1926 Referring Provider (PT): Dr. Ancil Boozer   Encounter Date: 04/11/2021   PT End of Session - 04/11/21 1112    Visit Number 25    Number of Visits 78    Date for PT Re-Evaluation 05/13/21    Authorization Type eval: 09/20/20, Progress note 01/02/2021, PN on 05/09/6294, Recert = 2/84/1324-03/02/271; PN on 4/202022    PT Start Time 1100    PT Stop Time 1144    PT Time Calculation (min) 44 min    Equipment Utilized During Treatment Gait belt    Activity Tolerance Patient tolerated treatment well    Behavior During Therapy [facility] for tasks assessed/performed           Past Medical History:  Diagnosis Date  . Allergy    Penicillin  . Arthritis   . Basal cell carcinoma   . BPH (benign prostatic hyperplasia)   . Chronic kidney disease    had a kidney stone which per family was a cyst that was removed  . Dysrhythmia   . GERD (gastroesophageal reflux disease)   . History of kidney stones   . Hyperlipidemia   . Hypertension   . Left-sided low back pain with left-sided sciatica   . Lung mass   . Splenic vein thrombosis   . Stroke ([place]) 05/22/2019  . Thrombosis 12/2015   mural  area and no notation of heart attack    Past Surgical History:  Procedure Laterality Date  . APPENDECTOMY    . CATARACT EXTRACTION, BILATERAL    . CYSTOSCOPY WITH INSERTION OF UROLIFT    . ENDOSCOPIC RETROGRADE CHOLANGIOPANCREATOGRAPHY (ERCP) WITH PROPOFOL N/A 05/11/2020   Procedure: ENDOSCOPIC RETROGRADE CHOLANGIOPANCREATOGRAPHY (ERCP) WITH PROPOFOL;  Surgeon: [name], MD;  Location: [facility] ENDOSCOPY;  Service: Endoscopy;  Laterality: N/A;  . ERCP N/A 10/23/2020   Procedure: ENDOSCOPIC RETROGRADE CHOLANGIOPANCREATOGRAPHY (ERCP);  Surgeon: [name], MD;   Location: [facility] ENDOSCOPY;  Service: Endoscopy;  Laterality: N/A;  . EYE SURGERY  08/2018  . IR EXCHANGE BILIARY DRAIN  05/04/2020  . KIDNEY STONE SURGERY    . KNEE SURGERY    . PACEMAKER INSERTION N/A 07/13/2018   Procedure: INSERTION PACEMAKER-DUEL CHAMBER INITIAL IMPLANT;  Surgeon: Isaias Cowman, MD;  Location: [facility] ORS;  Service: Cardiovascular;  Laterality: N/A;  . SPINE SURGERY      There were no vitals filed for this visit.   Subjective Assessment - 04/11/21 1108    Subjective Patient reports doing okay today without any significant issues.    Patient is accompained by: Family member    Pertinent History Pt referred for deconditioning and gait instability. He has had a decline since he last finished therapy. Pt had a hospital admission from 05/02/20 to 05/14/20 for acute cholecystitis and is now s/p cholecystostomy drain placement. Drain is still in place and pt is waiting to hear when they plan to remove it. Pt discharged to SNF and then returned home. In addition pt underwent radiation therapy for a RUL lung mass/cancer and had his last radiation treatment in September of 2021. He reports that he has continued to walk at home as his primary exercise. Pt was previously seen at this clinic for PT/OT due to weakness/imbalance s/p CVA. Prior history from 09/21/19: Pt is a 87 year old male who presents  with imbalance and difficulty with gait following a right frontal CVA with left hemiparesis on 05/22/19.  He has completed inpatient and home health PT, and now presents for OP PT.  He was discharged from inpatient rehab at a supervision level of assistance, ambulating with a RW.  His family notes that he is impulsive and will try to ambulate without his RW at home.  Family is currently providing 24/7 supervision.    Limitations House hold activities;Walking    How long can you sit comfortably? no limitations    How long can you walk comfortably? he requires a RW    Patient Stated Goals Pt wants  to be able to walk his granddaughter down the aisle at her wedding November 2022    Currently in Pain? No/denies            Interventions:   Nustep L2 LE only x 5 min  Standing LE strengthening with 1-2 UE support on //bars while standing on a blue airex pad-  Hip march (4lb) x 10 reps with BUE support and 5 reps each leg with 1 UE support.  Hip ext (4lb) BLE x 10 reps each with 1 UE support Hip abd (4lb) BLE x 10 reps each with 1 UE support- Increased difficulty with less ROM on left LE due to weakness.  Knee flext (4lb) BLE x 10 reps with 1 UE support- Minimal ROM on left LE.   Seated ham curls 7.5 lb on Matrix cable system 2 sets of 12 reps.   Neuromuscular re-education: Stepping over obstacles in the //bars- (blue bolster, 1/2 white foam roll x 2 and 6" step- then turn and go back for a total of 5 times down and back- with B UE Support  Scap retraction: matrix cable system-  1st set=7.5 lb  2nd set=12.5 lb Education provided throughout session via VC/TC and demonstration to facilitate movement at target joints and correct muscle activation for all testing and exercises performed.   Clinical Impression:  Patient performed fair with standing on balance pad requiring initially 2 UE support but able to progress to 1 UE  And exhibited some difficulty maintaining balance yet did improve with practice. Patient able to follow cues well for resistive strengthening using matrix cable system with no reported pain or observed difficulty. Pt will benefit from further skilled PT to improve BLE strength, balance and functional mobility                              PT Education - 04/11/21 1109    Education Details specific exercise technique with strengthening    Person(s) Educated Patient    Methods Explanation;Demonstration;Tactile cues;Verbal cues    Comprehension Verbalized understanding;Need further instruction;Returned demonstration;Verbal cues required;Tactile  cues required            PT Short Term Goals - 01/21/21 1438      PT SHORT TERM GOAL #1   Title Pt will be independent with HEP in order to improve strength and balance in order to decrease fall risk and improve function at home.    Baseline 01/02/2021- Patient verbalize understanding of walking and seated exercises for home program. 01/21/21 patient has been more active and understands HEP.    Time 6    Period Weeks    Status Achieved    Target Date 01/10/21             PT Long Term Goals - 03/20/21  [place] #1   Title Pt will improve BERG by at least 3 points in order to demonstrate clinically significant improvement in balance.    Baseline 09/20/20: 32/56 (Previous discharge: 03/06/20: 46/56), 12/14: 32/56, 1/13: 31/56, 01/16/21 35/56, 02/06/2021=36/56. 02/18/2021= 37/56.03/20/2021- Deferred secondary to left knee aching- will attempt next visit.    Time 12    Period Weeks    Status On-going    Target Date 05/13/21      PT LONG TERM GOAL #2   Title Pt will decrease TUG to below 14 seconds/decrease in order to demonstrate decreased fall risk.    Baseline 09/20/20: 19.2s, 12/14: 17.48, 1/13: 16.65 sec. 01/02/2021=15.75 sec using 4WW, 01/21/21  14.37 seconds. 02/06/2021= 14.92 sec using 4WW. 02/18/2021= 14.5 sec using 4WW. 03/20/2021- Deferred secondary to left knee aching- will attempt next visit.    Time 12    Period Weeks    Status On-going    Target Date 05/13/21      PT LONG TERM GOAL #3   Title Pt will decrease 5TSTS by at least 3 seconds in order to demonstrate clinically significant improvement in LE strength.    Baseline 09/20/20: 23.1s with minA+1 for strength and balance (Previous discharge 03/06/20: 16.1s), 12/14: 20 sec, 1/13: 22 sec.   01/02/2021= 16.05 sec with light UE support. 01/21/21 01/21/21 37.90 seconds without UE, with UE support 12.93 seconds: 02/06/2021= with 1 UE 17.93 sec. 02/18/2021= 21.33 sec without UE support.    Time 12    Period Weeks     Status On-going    Target Date 05/13/21      PT LONG TERM GOAL #4   Title Pt will increase his self-selected 10MWT to >0.74 m/s in order to demonstrate improvement in gait speed and improved community ambulation    Baseline 09/20/20: self-selected: 14.4s = 0.69 m/s with rollator (Previous discharge 03/06/20: self-selected: 13.6s = 0.74 m/s), 12/14: 0.75 m/s, 1/13: 0.78 m/s. 01/02/2021= 0.85 m/s using 4WW, 01/16/21 with 4 WW .99 m/s. 02/06/2021= self selected = 0.93 m/s with 3MO    Time 4    Period Weeks    Status Achieved      PT LONG TERM GOAL #5   Title Patient will increase six minute walk test distance to >1000 for progression to community ambulator and improve gait ability    Baseline 01/10/20  620 ft with rollator, 01/21/21  965 ft; 02/06/2021= 910 feet with 4WW.  02/18/2021= 950 feet with 4WW    Time 12    Period Weeks    Status On-going    Target Date 05/13/21                 Plan - 04/11/21 1112    Clinical Impression Statement Patient performed fair with standing on balance pad requiring initially 2 UE support but able to progress to 1 UE  And exhibited some difficulty maintaining balance yet did improve with practice. Patient able to follow cues well for resistive strengthening using matrix cable system with no reported pain or observed difficulty. Pt will benefit from further skilled PT to improve BLE strength, balance and functional mobility    Personal Factors and Comorbidities Age;Comorbidity 3+    Comorbidities HTN, CVA, cholecystitis with cholecystostomy drain    Examination-Activity Limitations Bend;Lift;Squat;Locomotion Level;Stairs;Stand    Examination-Participation Restrictions Community Activity;Driving;Laundry;Meal Prep;Shop    Stability/Clinical Decision Making Unstable/Unpredictable    Rehab Potential Fair    PT Frequency 2x / week  PT Duration 12 weeks    PT Treatment/Interventions ADLs/Self Care Home Management;Cryotherapy;Electrical Stimulation;Iontophoresis  4mg /ml Dexamethasone;Moist Heat;Traction;Ultrasound;DME Instruction;Gait training;Stair training;Therapeutic activities;Therapeutic exercise;Functional mobility training;Balance training;Neuromuscular re-education;Patient/family education;Manual techniques;Dry needling;Vestibular;Joint Manipulations;Spinal Manipulations;Aquatic Therapy    PT Next Visit Plan Continue to focus on LE strengthening and balance, dynamic balance tasks    Consulted and Agree with Plan of Care Patient;Family member/caregiver           Patient will benefit from skilled therapeutic intervention in order to improve the following deficits and impairments:  Abnormal gait,Decreased balance,Decreased mobility,Difficulty walking,Decreased knowledge of precautions,Decreased safety awareness,Decreased activity tolerance,Decreased strength,Decreased endurance  Visit Diagnosis: Abnormality of gait and mobility  Difficulty in walking, not elsewhere classified  Muscle weakness (generalized)  Other lack of coordination     Problem List Patient Active Problem List   Diagnosis Date Noted  . Moderate protein-calorie malnutrition ([place]) 03/11/2021  . Hemiparesis affecting left side as late effect of cerebrovascular accident (CVA) ([place]) 03/11/2021  . Aneurysm, aorta, thoracic ([place]) 12/21/2020  . Calculus of bile duct without cholecystitis and without obstruction   . Non-small cell carcinoma of lung, right ([place]) 09/07/2020  . Mass of upper lobe of right lung 07/13/2020  . Calculus of bile duct with cholecystitis without obstruction   . Abnormal findings on imaging of biliary tract   . Acute cholecystitis 05/02/2020  . Chronic venous insufficiency 12/05/2019  . 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 CKD (chronic kidney disease), stage II   . New onset atrial fibrillation ([place])   . 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 Goals of care, counseling/discussion   . Palliative care by specialist   . DNR  (do not resuscitate) discussion   . History of CVA (cerebrovascular accident) 05/22/2019  . Persistent proteinuria 07/30/2018  . 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 Chronic kidney disease, stage III (moderate) ([place]) 06/15/2018  . Leg pain 05/06/2018  . Lymphedema 05/06/2018  . Coagulopathy ([place]) 07/07/2017  . Lumbar spondylosis 03/10/2017  . Elevated uric acid in blood 11/20/2016  . BPH (benign prostatic hyperplasia) 05/29/2016  . Peripheral vascular disease of lower extremity ([place]) 03/03/2016  . Gallstone 12/31/2015  . 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 Atherosclerosis of aorta ([place]) 11/22/2015  . Carotid artery narrowing 11/22/2015  . Diverticulosis of colon 11/22/2015  . Decreased creatinine clearance 11/22/2015  . Arthritis, degenerative 11/22/2015  . Lactose intolerance 11/22/2015  . Basal cell carcinoma 11/22/2015  . Essential hypertension 06/21/2015  . Hyperlipemia 06/21/2015  . GERD (gastroesophageal reflux disease) 06/21/2015  . Calculus of kidney 11/18/2013    Lewis Moccasin, PT 04/12/2021, 7:49 AM  [place] MAIN [facility] SERVICES [address] [place], [place], [address] Phone: [phone]   Fax:  [phone]  Name: KENDALE REMBOLD MRN: [id number] Date of Birth: 04/14/1926

## 2021-04-15 ENCOUNTER — Ambulatory Visit: Payer: Medicare Other

## 2021-04-15 ENCOUNTER — Other Ambulatory Visit: Payer: Self-pay

## 2021-04-15 DIAGNOSIS — R278 Other lack of coordination: Secondary | ICD-10-CM

## 2021-04-15 DIAGNOSIS — R262 Difficulty in walking, not elsewhere classified: Secondary | ICD-10-CM

## 2021-04-15 DIAGNOSIS — R269 Unspecified abnormalities of gait and mobility: Secondary | ICD-10-CM

## 2021-04-15 DIAGNOSIS — M6281 Muscle weakness (generalized): Secondary | ICD-10-CM

## 2021-04-15 NOTE — Therapy (Signed)
[place] MAIN [facility] SERVICES [address] [place], [place], [address] Phone: [phone]   Fax:  [phone]  Physical Therapy Treatment  Patient Details  Name: Jose Castro MRN: [id number] Date of Birth: 1926/05/03 Referring Provider (PT): Dr. Ancil Boozer   Encounter Date: 04/15/2021   PT End of Session - 04/15/21 1504    Visit Number 26    Number of Visits 62    Date for PT Re-Evaluation 05/13/21    Authorization Type eval: 09/20/20, Progress note 01/02/2021, PN on 01/03/3006, Recert = 05/22/6332-5/45/6256; PN on 4/202022    PT Start Time 1430    PT Stop Time 1514    PT Time Calculation (min) 44 min    Equipment Utilized During Treatment Gait belt    Activity Tolerance Patient tolerated treatment well    Behavior During Therapy [facility] for tasks assessed/performed           Past Medical History:  Diagnosis Date  . Allergy    Penicillin  . Arthritis   . Basal cell carcinoma   . BPH (benign prostatic hyperplasia)   . Chronic kidney disease    had a kidney stone which per family was a cyst that was removed  . Dysrhythmia   . GERD (gastroesophageal reflux disease)   . History of kidney stones   . Hyperlipidemia   . Hypertension   . Left-sided low back pain with left-sided sciatica   . Lung mass   . Splenic vein thrombosis   . Stroke ([place]) 05/22/2019  . Thrombosis 12/2015   mural  area and no notation of heart attack    Past Surgical History:  Procedure Laterality Date  . APPENDECTOMY    . CATARACT EXTRACTION, BILATERAL    . CYSTOSCOPY WITH INSERTION OF UROLIFT    . ENDOSCOPIC RETROGRADE CHOLANGIOPANCREATOGRAPHY (ERCP) WITH PROPOFOL N/A 05/11/2020   Procedure: ENDOSCOPIC RETROGRADE CHOLANGIOPANCREATOGRAPHY (ERCP) WITH PROPOFOL;  Surgeon: [name], MD;  Location: [facility] ENDOSCOPY;  Service: Endoscopy;  Laterality: N/A;  . ERCP N/A 10/23/2020   Procedure: ENDOSCOPIC RETROGRADE CHOLANGIOPANCREATOGRAPHY (ERCP);  Surgeon: [name], MD;   Location: [facility] ENDOSCOPY;  Service: Endoscopy;  Laterality: N/A;  . EYE SURGERY  08/2018  . IR EXCHANGE BILIARY DRAIN  05/04/2020  . KIDNEY STONE SURGERY    . KNEE SURGERY    . PACEMAKER INSERTION N/A 07/13/2018   Procedure: INSERTION PACEMAKER-DUEL CHAMBER INITIAL IMPLANT;  Surgeon: Isaias Cowman, MD;  Location: [facility] ORS;  Service: Cardiovascular;  Laterality: N/A;  . SPINE SURGERY      There were no vitals filed for this visit.   Subjective Assessment - 04/15/21 1453    Subjective Patient reports 5/10 left knee pain and reports today as a "Bad" day    Patient is accompained by: Family member    Pertinent History Pt referred for deconditioning and gait instability. He has had a decline since he last finished therapy. Pt had a hospital admission from 05/02/20 to 05/14/20 for acute cholecystitis and is now s/p cholecystostomy drain placement. Drain is still in place and pt is waiting to hear when they plan to remove it. Pt discharged to SNF and then returned home. In addition pt underwent radiation therapy for a RUL lung mass/cancer and had his last radiation treatment in September of 2021. He reports that he has continued to walk at home as his primary exercise. Pt was previously seen at this clinic for PT/OT due to weakness/imbalance s/p CVA. Prior history from 09/21/19: Pt is a  87 year old male who presents with imbalance and difficulty with gait following a right frontal CVA with left hemiparesis on 05/22/19.  He has completed inpatient and home health PT, and now presents for OP PT.  He was discharged from inpatient rehab at a supervision level of assistance, ambulating with a RW.  His family notes that he is impulsive and will try to ambulate without his RW at home.  Family is currently providing 24/7 supervision.    Limitations House hold activities;Walking    How long can you sit comfortably? no limitations    How long can you walk comfortably? he requires a RW    Patient Stated Goals Pt  wants to be able to walk his granddaughter down the aisle at her wedding November 2022    Currently in Pain? Yes    Pain Score 5     Pain Location Knee    Pain Orientation Left    Pain Descriptors / Indicators Aching    Pain Type Chronic pain    Pain Onset More than a month ago    Pain Frequency Intermittent    Pain Relieving Factors Rest    Effect of Pain on Daily Activities Decreased weightbearing/walking              Interventions:   Nustep L2 LE only x 5 min - Cues to keep SPM > 50 and patient required adjustment with seat due to some left knee soreness.   Step up onto purple pad and 6" step wihtout UE ( unable- required 1 UE support) x 20 reps each LE. Patient presents with increased unsteadiness yet did better once he was using 1 UE support.     Posture on wall - Raising yellow weighted ball (2 sets of 10 reps) - VC for correct technique. Patient limited yet able to reach ball overhead.   Standing shoulder ext using matrix cable system- 7.5 lb (2 sets of 10 reps each) - VC and visual demo for correct technique   Scap retraction: matrix cable system-  1st set=7.5 lb x 12 reps 2nd set=12.5 lb x 12 reps 3rd set = 12.5lb x 12 reps 4th set = 12.5 lb c 12 reps  Education provided throughout session via VC/TC and demonstration to facilitate movement at target joints and correct muscle activation for all testing and exercises performed.   Clinical Impression:  Patient continued to exhibit difficulty with standing balance and unable to perform step tap without 1 UE support. He continues to benefit from [facility] and visual demo for postural strengthening and able to progress resistive strengthening using matrix cable system with no reported pain or observed difficulty. Pt will benefit from further skilled PT to improve BLE strength, balance and functional mobility  5 [place] Progression Recent Vital Signs   There were no vitals taken for this visit.   Past Medical History:  Diagnosis  Date  . Allergy    Penicillin  . Arthritis   . Basal cell carcinoma   . BPH (benign prostatic hyperplasia)   . Chronic kidney disease    had a kidney stone which per family was a cyst that was removed  . Dysrhythmia   . GERD (gastroesophageal reflux disease)   . History of kidney stones   . Hyperlipidemia   . Hypertension   . Left-sided low back pain with left-sided sciatica   . Lung mass   . Splenic vein thrombosis   . Stroke ([place]) 05/22/2019  . Thrombosis 12/2015   mural  area and no notation of heart attack     Expected Discharge Date     Diet Order    None       VTE Documentation      Work Intensity Score/Level of Care     @LEVELOFCARE @   Mobility        Significant Events    DC Barriers   Abnormal Labs:  Jose Castro 04/16/2021, 5:26 PM                                    PT Education - 04/15/21 1501    Education Details Specific exercise/balance form    Person(s) Educated Patient    Methods Explanation;Demonstration;Tactile cues;Verbal cues    Comprehension Verbalized understanding;Returned demonstration;Verbal cues required;Tactile cues required;Need further instruction            PT Short Term Goals - 01/21/21 1438      PT SHORT TERM GOAL #1   Title Pt will be independent with HEP in order to improve strength and balance in order to decrease fall risk and improve function at home.    Baseline 01/02/2021- Patient verbalize understanding of walking and seated exercises for home program. 01/21/21 patient has been more active and understands HEP.    Time 6    Period Weeks    Status Achieved    Target Date 01/10/21             PT Long Term Goals - 03/20/21 1136      PT LONG TERM GOAL #1   Title Pt will improve BERG by at least 3 points in order to demonstrate clinically significant improvement in balance.    Baseline 09/20/20: 32/56 (Previous discharge: 03/06/20: 46/56), 12/14: 32/56, 1/13: 31/56,  01/16/21 35/56, 02/06/2021=36/56. 02/18/2021= 37/56.03/20/2021- Deferred secondary to left knee aching- will attempt next visit.    Time 12    Period Weeks    Status On-going    Target Date 05/13/21      PT LONG TERM GOAL #2   Title Pt will decrease TUG to below 14 seconds/decrease in order to demonstrate decreased fall risk.    Baseline 09/20/20: 19.2s, 12/14: 17.48, 1/13: 16.65 sec. 01/02/2021=15.75 sec using 4WW, 01/21/21  14.37 seconds. 02/06/2021= 14.92 sec using 4WW. 02/18/2021= 14.5 sec using 4WW. 03/20/2021- Deferred secondary to left knee aching- will attempt next visit.    Time 12    Period Weeks    Status On-going    Target Date 05/13/21      PT LONG TERM GOAL #3   Title Pt will decrease 5TSTS by at least 3 seconds in order to demonstrate clinically significant improvement in LE strength.    Baseline 09/20/20: 23.1s with minA+1 for strength and balance (Previous discharge 03/06/20: 16.1s), 12/14: 20 sec, 1/13: 22 sec.   01/02/2021= 16.05 sec with light UE support. 01/21/21 01/21/21 37.90 seconds without UE, with UE support 12.93 seconds: 02/06/2021= with 1 UE 17.93 sec. 02/18/2021= 21.33 sec without UE support.    Time 12    Period Weeks    Status On-going    Target Date 05/13/21      PT LONG TERM GOAL #4   Title Pt will increase his self-selected 10MWT to >0.74 m/s in order to demonstrate improvement in gait speed and improved community ambulation    Baseline 09/20/20: self-selected: 14.4s = 0.69 m/s with rollator (Previous discharge 03/06/20: self-selected: 13.6s = 0.74  m/s), 12/14: 0.75 m/s, 1/13: 0.78 m/s. 01/02/2021= 0.85 m/s using 4WW, 01/16/21 with 4 WW .99 m/s. 02/06/2021= self selected = 0.93 m/s with 3ZJ    Time 4    Period Weeks    Status Achieved      PT LONG TERM GOAL #5   Title Patient will increase six minute walk test distance to >1000 for progression to community ambulator and improve gait ability    Baseline 01/10/20  620 ft with rollator, 01/21/21  965 ft; 02/06/2021= 910 feet with  4WW.  02/18/2021= 950 feet with 4WW    Time 12    Period Weeks    Status On-going    Target Date 05/13/21                 Plan - 04/15/21 1504    Clinical Impression Statement Patient continued to exhibit difficulty with standing balance and unable to perform step tap without 1 UE support. He continues to benefit from [facility] and visual demo for postural strengthening and able to progress resistive strengthening using matrix cable system with no reported pain or observed difficulty. Pt will benefit from further skilled PT to improve BLE strength, balance and functional mobility    Personal Factors and Comorbidities Age;Comorbidity 3+    Comorbidities HTN, CVA, cholecystitis with cholecystostomy drain    Examination-Activity Limitations Bend;Lift;Squat;Locomotion Level;Stairs;Stand    Examination-Participation Restrictions Community Activity;Driving;Laundry;Meal Prep;Shop    Stability/Clinical Decision Making Unstable/Unpredictable    Rehab Potential Fair    PT Frequency 2x / week    PT Duration 12 weeks    PT Treatment/Interventions ADLs/Self Care Home Management;Cryotherapy;Electrical Stimulation;Iontophoresis 4mg /ml Dexamethasone;Moist Heat;Traction;Ultrasound;DME Instruction;Gait training;Stair training;Therapeutic activities;Therapeutic exercise;Functional mobility training;Balance training;Neuromuscular re-education;Patient/family education;Manual techniques;Dry needling;Vestibular;Joint Manipulations;Spinal Manipulations;Aquatic Therapy    PT Next Visit Plan Continue to focus on LE strengthening and balance, dynamic balance tasks    Consulted and Agree with Plan of Care Patient;Family member/caregiver           Patient will benefit from skilled therapeutic intervention in order to improve the following deficits and impairments:  Abnormal gait,Decreased balance,Decreased mobility,Difficulty walking,Decreased knowledge of precautions,Decreased safety awareness,Decreased activity  tolerance,Decreased strength,Decreased endurance  Visit Diagnosis: Abnormality of gait and mobility  Difficulty in walking, not elsewhere classified  Muscle weakness (generalized)  Other lack of coordination     Problem List Patient Active Problem List   Diagnosis Date Noted  . Moderate protein-calorie malnutrition ([place]) 03/11/2021  . Hemiparesis affecting left side as late effect of cerebrovascular accident (CVA) ([place]) 03/11/2021  . Aneurysm, aorta, thoracic ([place]) 12/21/2020  . Calculus of bile duct without cholecystitis and without obstruction   . Non-small cell carcinoma of lung, right ([place]) 09/07/2020  . Mass of upper lobe of right lung 07/13/2020  . Calculus of bile duct with cholecystitis without obstruction   . Abnormal findings on imaging of biliary tract   . Acute cholecystitis 05/02/2020  . Chronic venous insufficiency 12/05/2019  . 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 CKD (chronic kidney disease), stage II   . New onset atrial fibrillation ([place])   . 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 Goals of care, counseling/discussion   . Palliative care by specialist   . DNR (do not resuscitate) discussion   . History of CVA (cerebrovascular accident) 05/22/2019  . Persistent proteinuria 07/30/2018  . 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 Chronic kidney disease, stage III (moderate) ([place]) 06/15/2018  . Leg pain 05/06/2018  .  Lymphedema 05/06/2018  . Coagulopathy ([place]) 07/07/2017  . Lumbar spondylosis 03/10/2017  . Elevated uric acid in blood 11/20/2016  . BPH (benign prostatic hyperplasia) 05/29/2016  . Peripheral vascular disease of lower extremity ([place]) 03/03/2016  . Gallstone 12/31/2015  . 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 Atherosclerosis of aorta ([place]) 11/22/2015  . Carotid artery narrowing 11/22/2015  . Diverticulosis of colon 11/22/2015  . Decreased creatinine clearance 11/22/2015   . Arthritis, degenerative 11/22/2015  . Lactose intolerance 11/22/2015  . Basal cell carcinoma 11/22/2015  . Essential hypertension 06/21/2015  . Hyperlipemia 06/21/2015  . GERD (gastroesophageal reflux disease) 06/21/2015  . Calculus of kidney 11/18/2013    Jose Castro, PT 04/16/2021, 5:26 PM  [place] MAIN [facility] SERVICES [address] [place], [place], [address] Phone: [phone]   Fax:  [phone]  Name: Jose Castro MRN: [id number] Date of Birth: Dec 13, 1925

## 2021-04-23 ENCOUNTER — Other Ambulatory Visit: Payer: Self-pay

## 2021-04-23 ENCOUNTER — Ambulatory Visit: Payer: Medicare Other

## 2021-04-23 DIAGNOSIS — R262 Difficulty in walking, not elsewhere classified: Secondary | ICD-10-CM

## 2021-04-23 DIAGNOSIS — R278 Other lack of coordination: Secondary | ICD-10-CM

## 2021-04-23 DIAGNOSIS — R269 Unspecified abnormalities of gait and mobility: Secondary | ICD-10-CM

## 2021-04-23 DIAGNOSIS — M6281 Muscle weakness (generalized): Secondary | ICD-10-CM

## 2021-04-23 NOTE — Therapy (Signed)
[place] MAIN [facility] SERVICES [address] [place], [place], [address] Phone: [phone]   Fax:  [phone]  Physical Therapy Treatment  Patient Details  Name: Jose Castro MRN: [id number] Date of Birth: Feb 09, 1926 Referring Provider (PT): Dr. Ancil Boozer   Encounter Date: 04/23/2021   PT End of Session - 04/23/21 1355    Visit Number 25    Number of Visits 8    Date for PT Re-Evaluation 05/13/21    Authorization Type eval: 09/20/20, Progress note 01/02/2021, PN on 06/02/7105, Recert = 2/69/4854-05/27/349; PN on 4/202022    PT Start Time 1345    PT Stop Time 1428    PT Time Calculation (min) 43 min    Equipment Utilized During Treatment Gait belt    Activity Tolerance Patient tolerated treatment well    Behavior During Therapy [facility] for tasks assessed/performed           Past Medical History:  Diagnosis Date  . Allergy    Penicillin  . Arthritis   . Basal cell carcinoma   . BPH (benign prostatic hyperplasia)   . Chronic kidney disease    had a kidney stone which per family was a cyst that was removed  . Dysrhythmia   . GERD (gastroesophageal reflux disease)   . History of kidney stones   . Hyperlipidemia   . Hypertension   . Left-sided low back pain with left-sided sciatica   . Lung mass   . Splenic vein thrombosis   . Stroke ([place]) 05/22/2019  . Thrombosis 12/2015   mural  area and no notation of heart attack    Past Surgical History:  Procedure Laterality Date  . APPENDECTOMY    . CATARACT EXTRACTION, BILATERAL    . CYSTOSCOPY WITH INSERTION OF UROLIFT    . ENDOSCOPIC RETROGRADE CHOLANGIOPANCREATOGRAPHY (ERCP) WITH PROPOFOL N/A 05/11/2020   Procedure: ENDOSCOPIC RETROGRADE CHOLANGIOPANCREATOGRAPHY (ERCP) WITH PROPOFOL;  Surgeon: [name], MD;  Location: [facility] ENDOSCOPY;  Service: Endoscopy;  Laterality: N/A;  . ERCP N/A 10/23/2020   Procedure: ENDOSCOPIC RETROGRADE CHOLANGIOPANCREATOGRAPHY (ERCP);  Surgeon: [name], MD;   Location: [facility] ENDOSCOPY;  Service: Endoscopy;  Laterality: N/A;  . EYE SURGERY  08/2018  . IR EXCHANGE BILIARY DRAIN  05/04/2020  . KIDNEY STONE SURGERY    . KNEE SURGERY    . PACEMAKER INSERTION N/A 07/13/2018   Procedure: INSERTION PACEMAKER-DUEL CHAMBER INITIAL IMPLANT;  Surgeon: Isaias Cowman, MD;  Location: [facility] ORS;  Service: Cardiovascular;  Laterality: N/A;  . SPINE SURGERY      There were no vitals filed for this visit.   Subjective Assessment - 04/23/21 1353    Subjective Patient reports ongoing left knee soreness.    Patient is accompained by: Family member    Pertinent History Pt referred for deconditioning and gait instability. He has had a decline since he last finished therapy. Pt had a hospital admission from 05/02/20 to 05/14/20 for acute cholecystitis and is now s/p cholecystostomy drain placement. Drain is still in place and pt is waiting to hear when they plan to remove it. Pt discharged to SNF and then returned home. In addition pt underwent radiation therapy for a RUL lung mass/cancer and had his last radiation treatment in September of 2021. He reports that he has continued to walk at home as his primary exercise. Pt was previously seen at this clinic for PT/OT due to weakness/imbalance s/p CVA. Prior history from 09/21/19: Pt is a 87 year old male who presents with imbalance and  difficulty with gait following a right frontal CVA with left hemiparesis on 05/22/19.  He has completed inpatient and home health PT, and now presents for OP PT.  He was discharged from inpatient rehab at a supervision level of assistance, ambulating with a RW.  His family notes that he is impulsive and will try to ambulate without his RW at home.  Family is currently providing 24/7 supervision.    Limitations House hold activities;Walking    How long can you sit comfortably? no limitations    How long can you walk comfortably? he requires a RW    Patient Stated Goals Pt wants to be able to walk  his granddaughter down the aisle at her wedding November 2022    Currently in Pain? Yes    Pain Score 5     Pain Location Knee    Pain Orientation Left    Pain Descriptors / Indicators Aching    Pain Type Chronic pain    Pain Onset More than a month ago    Pain Frequency Constant    Aggravating Factors  Prolonged standing    Pain Relieving Factors Rest    Effect of Pain on Daily Activities Decreased standing time and walking              Therapeutic exercise:   Standing step tap 3lb BLE x 10 reps- Patient performed very quickly so instructed in 2nd trial performing slower to focus on balance- Patient able to complete without significant difficulty with BUE support.  Standing step ups 3lb BLE and BUE support on rails x 10 reps- Patient exhibited difficulty with left quad control- buckling 2-3 times on steps- requiring close CGA- Cues for slow controlled movment and patient able to improve.  Sit to stand with Left foot on purple pad x 10 reps with bilateral UE support. - Patient reported no increase in left knee pain.  Seated LAQ 3lb. AW 2 x 12 reps - Cues to slow down and perform through full ROM.  Seated hip/flex with abd up and over hedgehog 2 sets of 12 reps BLE  Gait - 160 feet using 4WW and 3lb. Each LE x 1 min 36 sec Gait- 160 feet using 4WW and 0 weight each LE x 1 min 25 sec- slightly improved gait quality with increased step length without ankle weights.   Clinical impression: Patient exhibiting some difficulty with quad control with step up today and unable to perform full TKE on left due to  Quad weakness/Hamstring tightness. He was able to complete all other activities with good standing endurance and adequate LE strength. Pt will benefit from further skilled PT to improve BLE strength, balance and functional mobility                         PT Short Term Goals - 01/21/21 1438      PT SHORT TERM GOAL #1   Title Pt will be independent with HEP in  order to improve strength and balance in order to decrease fall risk and improve function at home.    Baseline 01/02/2021- Patient verbalize understanding of walking and seated exercises for home program. 01/21/21 patient has been more active and understands HEP.    Time 6    Period Weeks    Status Achieved    Target Date 01/10/21             PT Long Term Goals - 03/20/21 1136  PT LONG TERM GOAL #1   Title Pt will improve BERG by at least 3 points in order to demonstrate clinically significant improvement in balance.    Baseline 09/20/20: 32/56 (Previous discharge: 03/06/20: 46/56), 12/14: 32/56, 1/13: 31/56, 01/16/21 35/56, 02/06/2021=36/56. 02/18/2021= 37/56.03/20/2021- Deferred secondary to left knee aching- will attempt next visit.    Time 12    Period Weeks    Status On-going    Target Date 05/13/21      PT LONG TERM GOAL #2   Title Pt will decrease TUG to below 14 seconds/decrease in order to demonstrate decreased fall risk.    Baseline 09/20/20: 19.2s, 12/14: 17.48, 1/13: 16.65 sec. 01/02/2021=15.75 sec using 4WW, 01/21/21  14.37 seconds. 02/06/2021= 14.92 sec using 4WW. 02/18/2021= 14.5 sec using 4WW. 03/20/2021- Deferred secondary to left knee aching- will attempt next visit.    Time 12    Period Weeks    Status On-going    Target Date 05/13/21      PT LONG TERM GOAL #3   Title Pt will decrease 5TSTS by at least 3 seconds in order to demonstrate clinically significant improvement in LE strength.    Baseline 09/20/20: 23.1s with minA+1 for strength and balance (Previous discharge 03/06/20: 16.1s), 12/14: 20 sec, 1/13: 22 sec.   01/02/2021= 16.05 sec with light UE support. 01/21/21 01/21/21 37.90 seconds without UE, with UE support 12.93 seconds: 02/06/2021= with 1 UE 17.93 sec. 02/18/2021= 21.33 sec without UE support.    Time 12    Period Weeks    Status On-going    Target Date 05/13/21      PT LONG TERM GOAL #4   Title Pt will increase his self-selected 10MWT to >0.74 m/s in order to  demonstrate improvement in gait speed and improved community ambulation    Baseline 09/20/20: self-selected: 14.4s = 0.69 m/s with rollator (Previous discharge 03/06/20: self-selected: 13.6s = 0.74 m/s), 12/14: 0.75 m/s, 1/13: 0.78 m/s. 01/02/2021= 0.85 m/s using 4WW, 01/16/21 with 4 WW .99 m/s. 02/06/2021= self selected = 0.93 m/s with 6WV    Time 4    Period Weeks    Status Achieved      PT LONG TERM GOAL #5   Title Patient will increase six minute walk test distance to >1000 for progression to community ambulator and improve gait ability    Baseline 01/10/20  620 ft with rollator, 01/21/21  965 ft; 02/06/2021= 910 feet with 4WW.  02/18/2021= 950 feet with 4WW    Time 12    Period Weeks    Status On-going    Target Date 05/13/21                 Plan - 04/23/21 1359    Clinical Impression Statement Patient exhibiting some difficulty with quad control with step up today and unable to perform full TKE on left due to  Quad weakness/Hamstring tightness. He was able to complete all other activities with good standing endurance and adequate LE strength. Pt will benefit from further skilled PT to improve BLE strength, balance and functional mobility    Personal Factors and Comorbidities Age;Comorbidity 3+    Comorbidities HTN, CVA, cholecystitis with cholecystostomy drain    Examination-Activity Limitations Bend;Lift;Squat;Locomotion Level;Stairs;Stand    Examination-Participation Restrictions Community Activity;Driving;Laundry;Meal Prep;Shop    Stability/Clinical Decision Making Unstable/Unpredictable    Rehab Potential Fair    PT Frequency 2x / week    PT Duration 12 weeks    PT Treatment/Interventions ADLs/Self Care Home Management;Cryotherapy;Electrical Stimulation;Iontophoresis 4mg /ml  Dexamethasone;Moist Heat;Traction;Ultrasound;DME Instruction;Gait training;Stair training;Therapeutic activities;Therapeutic exercise;Functional mobility training;Balance training;Neuromuscular  re-education;Patient/family education;Manual techniques;Dry needling;Vestibular;Joint Manipulations;Spinal Manipulations;Aquatic Therapy    PT Next Visit Plan Continue to focus on LE strengthening and balance, dynamic balance tasks    Consulted and Agree with Plan of Care Patient;Family member/caregiver           Patient will benefit from skilled therapeutic intervention in order to improve the following deficits and impairments:  Abnormal gait,Decreased balance,Decreased mobility,Difficulty walking,Decreased knowledge of precautions,Decreased safety awareness,Decreased activity tolerance,Decreased strength,Decreased endurance  Visit Diagnosis: Abnormality of gait and mobility  Difficulty in walking, not elsewhere classified  Muscle weakness (generalized)  Other lack of coordination     Problem List Patient Active Problem List   Diagnosis Date Noted  . Moderate protein-calorie malnutrition ([name]) 03/11/2021  . Hemiparesis affecting left side as late effect of cerebrovascular accident (CVA) ([place]) 03/11/2021  . Aneurysm, aorta, thoracic ([place]) 12/21/2020  . Calculus of bile duct without cholecystitis and without obstruction   . Non-small cell carcinoma of lung, right ([place]) 09/07/2020  . Mass of upper lobe of right lung 07/13/2020  . Calculus of bile duct with cholecystitis without obstruction   . Abnormal findings on imaging of biliary tract   . Acute cholecystitis 05/02/2020  . Chronic venous insufficiency 12/05/2019  . 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 CKD (chronic kidney disease), stage II   . New onset atrial fibrillation ([place])   . 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 Goals of care, counseling/discussion   . Palliative care by specialist   . DNR (do not resuscitate) discussion   . History of CVA (cerebrovascular accident) 05/22/2019  . Persistent proteinuria 07/30/2018  . 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 Chronic  kidney disease, stage III (moderate) ([place]) 06/15/2018  . Leg pain 05/06/2018  . Lymphedema 05/06/2018  . Coagulopathy ([place]) 07/07/2017  . Lumbar spondylosis 03/10/2017  . Elevated uric acid in blood 11/20/2016  . BPH (benign prostatic hyperplasia) 05/29/2016  . Peripheral vascular disease of lower extremity ([place]) 03/03/2016  . Gallstone 12/31/2015  . 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 Atherosclerosis of aorta ([place]) 11/22/2015  . Carotid artery narrowing 11/22/2015  . Diverticulosis of colon 11/22/2015  . Decreased creatinine clearance 11/22/2015  . Arthritis, degenerative 11/22/2015  . Lactose intolerance 11/22/2015  . Basal cell carcinoma 11/22/2015  . Essential hypertension 06/21/2015  . Hyperlipemia 06/21/2015  . GERD (gastroesophageal reflux disease) 06/21/2015  . Calculus of kidney 11/18/2013    Lewis Moccasin, PT 04/23/2021, 2:59 PM  [place] MAIN [facility] SERVICES [address] [place], [place], [address] Phone: [phone]   Fax:  [phone]  Name: Jose Castro MRN: [id number] Date of Birth: 1926-03-26

## 2021-04-29 ENCOUNTER — Other Ambulatory Visit: Payer: Self-pay | Admitting: Family Medicine

## 2021-04-29 DIAGNOSIS — R1012 Left upper quadrant pain: Secondary | ICD-10-CM

## 2021-04-29 DIAGNOSIS — R1011 Right upper quadrant pain: Secondary | ICD-10-CM

## 2021-04-29 DIAGNOSIS — R112 Nausea with vomiting, unspecified: Secondary | ICD-10-CM

## 2021-04-29 DIAGNOSIS — D72829 Elevated white blood cell count, unspecified: Secondary | ICD-10-CM

## 2021-04-29 DIAGNOSIS — R1013 Epigastric pain: Secondary | ICD-10-CM

## 2021-04-30 ENCOUNTER — Ambulatory Visit: Payer: Medicare Other

## 2021-04-30 ENCOUNTER — Other Ambulatory Visit: Payer: Self-pay

## 2021-04-30 DIAGNOSIS — R269 Unspecified abnormalities of gait and mobility: Secondary | ICD-10-CM

## 2021-04-30 DIAGNOSIS — R2689 Other abnormalities of gait and mobility: Secondary | ICD-10-CM

## 2021-04-30 DIAGNOSIS — M6281 Muscle weakness (generalized): Secondary | ICD-10-CM

## 2021-04-30 DIAGNOSIS — R262 Difficulty in walking, not elsewhere classified: Secondary | ICD-10-CM

## 2021-04-30 NOTE — Therapy (Signed)
[place] MAIN [facility] SERVICES [address] [place], [place], [address] Phone: [phone]   Fax:  [phone]  Physical Therapy Treatment  Patient Details  Name: Jose Castro MRN: [id number] Date of Birth: 25-Sep-1926 Referring Provider (PT): Dr. Ancil Boozer   Encounter Date: 04/30/2021   PT End of Session - 04/30/21 1418    Visit Number 28    Number of Visits 57    Date for PT Re-Evaluation 05/13/21    Authorization Type eval: 09/20/20, Progress note 01/02/2021, PN on 0/08/7352, Recert = 2/99/2426-8/34/1962; PN on 4/202022    PT Start Time 1401    PT Stop Time 1430    PT Time Calculation (min) 29 min    Equipment Utilized During Treatment Gait belt    Activity Tolerance Patient tolerated treatment well    Behavior During Therapy [facility] for tasks assessed/performed           Past Medical History:  Diagnosis Date  . Allergy    Penicillin  . Arthritis   . Basal cell carcinoma   . BPH (benign prostatic hyperplasia)   . Chronic kidney disease    had a kidney stone which per family was a cyst that was removed  . Dysrhythmia   . GERD (gastroesophageal reflux disease)   . History of kidney stones   . Hyperlipidemia   . Hypertension   . Left-sided low back pain with left-sided sciatica   . Lung mass   . Splenic vein thrombosis   . Stroke ([place]) 05/22/2019  . Thrombosis 12/2015   mural  area and no notation of heart attack    Past Surgical History:  Procedure Laterality Date  . APPENDECTOMY    . CATARACT EXTRACTION, BILATERAL    . CYSTOSCOPY WITH INSERTION OF UROLIFT    . ENDOSCOPIC RETROGRADE CHOLANGIOPANCREATOGRAPHY (ERCP) WITH PROPOFOL N/A 05/11/2020   Procedure: ENDOSCOPIC RETROGRADE CHOLANGIOPANCREATOGRAPHY (ERCP) WITH PROPOFOL;  Surgeon: [name], MD;  Location: [facility] ENDOSCOPY;  Service: Endoscopy;  Laterality: N/A;  . ERCP N/A 10/23/2020   Procedure: ENDOSCOPIC RETROGRADE CHOLANGIOPANCREATOGRAPHY (ERCP);  Surgeon: [name], MD;   Location: [facility] ENDOSCOPY;  Service: Endoscopy;  Laterality: N/A;  . EYE SURGERY  08/2018  . IR EXCHANGE BILIARY DRAIN  05/04/2020  . KIDNEY STONE SURGERY    . KNEE SURGERY    . PACEMAKER INSERTION N/A 07/13/2018   Procedure: INSERTION PACEMAKER-DUEL CHAMBER INITIAL IMPLANT;  Surgeon: Isaias Cowman, MD;  Location: [facility] ORS;  Service: Cardiovascular;  Laterality: N/A;  . SPINE SURGERY      There were no vitals filed for this visit.   Subjective Assessment - 04/30/21 1416    Subjective Patient reports no new complaints and states doing pretty well overall today.    Patient is accompained by: Family member    Pertinent History Pt referred for deconditioning and gait instability. He has had a decline since he last finished therapy. Pt had a hospital admission from 05/02/20 to 05/14/20 for acute cholecystitis and is now s/p cholecystostomy drain placement. Drain is still in place and pt is waiting to hear when they plan to remove it. Pt discharged to SNF and then returned home. In addition pt underwent radiation therapy for a RUL lung mass/cancer and had his last radiation treatment in September of 2021. He reports that he has continued to walk at home as his primary exercise. Pt was previously seen at this clinic for PT/OT due to weakness/imbalance s/p CVA. Prior history from 09/21/19: Pt is a 87 year old  male who presents with imbalance and difficulty with gait following a right frontal CVA with left hemiparesis on 05/22/19.  He has completed inpatient and home health PT, and now presents for OP PT.  He was discharged from inpatient rehab at a supervision level of assistance, ambulating with a RW.  His family notes that he is impulsive and will try to ambulate without his RW at home.  Family is currently providing 24/7 supervision.    Limitations House hold activities;Walking    How long can you sit comfortably? no limitations    How long can you walk comfortably? he requires a RW    Patient Stated  Goals Pt wants to be able to walk his granddaughter down the aisle at her wedding November 2022    Pain Onset More than a month ago               * patient 15 min late for appt:  Step tap onto treadmill x 15 reps without UE support Step tap onto treadmill x 12 reps with 3lb Step tap onto treadmill x 10 reps with 4lb  Side step over orange hurdle- x 12 reps BLE. (Min BUE support on bar)   Sit to stand from 24 in height and No UE x 3 reps Sit to stand from 21 in height and no UE support x 3 reps Sit to stand from 19 in height and no UE support x 4 reps  Seated knee ext 4lb x 12 reps Seated hip march 4lb x 12 reps Patient required VC to slow down and count out loud- VC for proper form   Gait in hallway with 4WW, CGA with horizontal head turns calling out sticky notes on walls (left and right)- patient exhibiting slowing gait velocity yet able to continue walking throughout without loss of balance.   Clinical Impression: Treatment limited secondary to patient arriving 15 min late to appointment however he performed well today with minimal VC for specific technique. He was fatigued at end of session yet no loss of balance with gait activity. Pt will benefit from further skilled PT to improve BLE strength, balance and functional mobility                      PT Education - 04/30/21 1417    Education Details specific exercise form    Person(s) Educated Patient    Methods Explanation;Demonstration;Tactile cues;Verbal cues    Comprehension Verbalized understanding;Returned demonstration;Verbal cues required;Tactile cues required;Need further instruction            PT Short Term Goals - 01/21/21 1438      PT SHORT TERM GOAL #1   Title Pt will be independent with HEP in order to improve strength and balance in order to decrease fall risk and improve function at home.    Baseline 01/02/2021- Patient verbalize understanding of walking and seated exercises for home  program. 01/21/21 patient has been more active and understands HEP.    Time 6    Period Weeks    Status Achieved    Target Date 01/10/21             PT Long Term Goals - 03/20/21 1136      PT LONG TERM GOAL #1   Title Pt will improve BERG by at least 3 points in order to demonstrate clinically significant improvement in balance.    Baseline 09/20/20: 32/56 (Previous discharge: 03/06/20: 46/56), 12/14: 32/56, 1/13: 31/56, 01/16/21 35/56, 02/06/2021=36/56. 02/18/2021=  37/56.03/20/2021- Deferred secondary to left knee aching- will attempt next visit.    Time 12    Period Weeks    Status On-going    Target Date 05/13/21      PT LONG TERM GOAL #2   Title Pt will decrease TUG to below 14 seconds/decrease in order to demonstrate decreased fall risk.    Baseline 09/20/20: 19.2s, 12/14: 17.48, 1/13: 16.65 sec. 01/02/2021=15.75 sec using 4WW, 01/21/21  14.37 seconds. 02/06/2021= 14.92 sec using 4WW. 02/18/2021= 14.5 sec using 4WW. 03/20/2021- Deferred secondary to left knee aching- will attempt next visit.    Time 12    Period Weeks    Status On-going    Target Date 05/13/21      PT LONG TERM GOAL #3   Title Pt will decrease 5TSTS by at least 3 seconds in order to demonstrate clinically significant improvement in LE strength.    Baseline 09/20/20: 23.1s with minA+1 for strength and balance (Previous discharge 03/06/20: 16.1s), 12/14: 20 sec, 1/13: 22 sec.   01/02/2021= 16.05 sec with light UE support. 01/21/21 01/21/21 37.90 seconds without UE, with UE support 12.93 seconds: 02/06/2021= with 1 UE 17.93 sec. 02/18/2021= 21.33 sec without UE support.    Time 12    Period Weeks    Status On-going    Target Date 05/13/21      PT LONG TERM GOAL #4   Title Pt will increase his self-selected 10MWT to >0.74 m/s in order to demonstrate improvement in gait speed and improved community ambulation    Baseline 09/20/20: self-selected: 14.4s = 0.69 m/s with rollator (Previous discharge 03/06/20: self-selected: 13.6s = 0.74  m/s), 12/14: 0.75 m/s, 1/13: 0.78 m/s. 01/02/2021= 0.85 m/s using 4WW, 01/16/21 with 4 WW .99 m/s. 02/06/2021= self selected = 0.93 m/s with 0JJ    Time 4    Period Weeks    Status Achieved      PT LONG TERM GOAL #5   Title Patient will increase six minute walk test distance to >1000 for progression to community ambulator and improve gait ability    Baseline 01/10/20  620 ft with rollator, 01/21/21  965 ft; 02/06/2021= 910 feet with 4WW.  02/18/2021= 950 feet with 4WW    Time 12    Period Weeks    Status On-going    Target Date 05/13/21                 Plan - 04/30/21 2225    Clinical Impression Statement Treatment limited secondary to patient arriving 15 min late to appointment however he performed well today with minimal VC for specific technique. He was fatigued at end of session yet no loss of balance with gait activity. Pt will benefit from further skilled PT to improve BLE strength, balance and functional mobility    Personal Factors and Comorbidities Age;Comorbidity 3+    Comorbidities HTN, CVA, cholecystitis with cholecystostomy drain    Examination-Activity Limitations Bend;Lift;Squat;Locomotion Level;Stairs;Stand    Examination-Participation Restrictions Community Activity;Driving;Laundry;Meal Prep;Shop    Stability/Clinical Decision Making Unstable/Unpredictable    Rehab Potential Fair    PT Frequency 2x / week    PT Duration 12 weeks    PT Treatment/Interventions ADLs/Self Care Home Management;Cryotherapy;Electrical Stimulation;Iontophoresis 4mg /ml Dexamethasone;Moist Heat;Traction;Ultrasound;DME Instruction;Gait training;Stair training;Therapeutic activities;Therapeutic exercise;Functional mobility training;Balance training;Neuromuscular re-education;Patient/family education;Manual techniques;Dry needling;Vestibular;Joint Manipulations;Spinal Manipulations;Aquatic Therapy    PT Next Visit Plan Continue to focus on LE strengthening and balance, dynamic balance tasks    Consulted  and Agree with Plan of Care Patient;Family member/caregiver  Patient will benefit from skilled therapeutic intervention in order to improve the following deficits and impairments:  Abnormal gait,Decreased balance,Decreased mobility,Difficulty walking,Decreased knowledge of precautions,Decreased safety awareness,Decreased activity tolerance,Decreased strength,Decreased endurance  Visit Diagnosis: Abnormality of gait and mobility  Difficulty in walking, not elsewhere classified  Muscle weakness (generalized)  Other abnormalities of gait and mobility     Problem List Patient Active Problem List   Diagnosis Date Noted  . Moderate protein-calorie malnutrition ([place]) 03/11/2021  . Hemiparesis affecting left side as late effect of cerebrovascular accident (CVA) ([place]) 03/11/2021  . Aneurysm, aorta, thoracic ([place]) 12/21/2020  . Calculus of bile duct without cholecystitis and without obstruction   . Non-small cell carcinoma of lung, right ([place]) 09/07/2020  . Mass of upper lobe of right lung 07/13/2020  . Calculus of bile duct with cholecystitis without obstruction   . Abnormal findings on imaging of biliary tract   . Acute cholecystitis 05/02/2020  . Chronic venous insufficiency 12/05/2019  . 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 CKD (chronic kidney disease), stage II   . New onset atrial fibrillation ([place])   . 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 Goals of care, counseling/discussion   . Palliative care by specialist   . DNR (do not resuscitate) discussion   . History of CVA (cerebrovascular accident) 05/22/2019  . Persistent proteinuria 07/30/2018  . 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 Chronic kidney disease, stage III (moderate) ([place]) 06/15/2018  . Leg pain 05/06/2018  . Lymphedema 05/06/2018  . Coagulopathy ([place]) 07/07/2017  . Lumbar spondylosis 03/10/2017  . Elevated uric acid in blood 11/20/2016  . BPH (benign  prostatic hyperplasia) 05/29/2016  . Peripheral vascular disease of lower extremity ([place]) 03/03/2016  . Gallstone 12/31/2015  . 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 Atherosclerosis of aorta ([place]) 11/22/2015  . Carotid artery narrowing 11/22/2015  . Diverticulosis of colon 11/22/2015  . Decreased creatinine clearance 11/22/2015  . Arthritis, degenerative 11/22/2015  . Lactose intolerance 11/22/2015  . Basal cell carcinoma 11/22/2015  . Essential hypertension 06/21/2015  . Hyperlipemia 06/21/2015  . GERD (gastroesophageal reflux disease) 06/21/2015  . Calculus of kidney 11/18/2013    Lewis Moccasin 04/30/2021, 10:31 PM  [place] MAIN [facility] SERVICES [address] [place], [place], [address] Phone: [phone]   Fax:  [phone]  Name: RAZI HICKLE MRN: [id number] Date of Birth: 03-27-1926

## 2021-05-02 ENCOUNTER — Ambulatory Visit: Payer: Medicare Other | Attending: Family Medicine

## 2021-05-02 ENCOUNTER — Other Ambulatory Visit: Payer: Self-pay

## 2021-05-02 DIAGNOSIS — M6281 Muscle weakness (generalized): Secondary | ICD-10-CM | POA: Insufficient documentation

## 2021-05-02 DIAGNOSIS — R262 Difficulty in walking, not elsewhere classified: Secondary | ICD-10-CM | POA: Insufficient documentation

## 2021-05-02 DIAGNOSIS — R278 Other lack of coordination: Secondary | ICD-10-CM | POA: Insufficient documentation

## 2021-05-02 DIAGNOSIS — R269 Unspecified abnormalities of gait and mobility: Secondary | ICD-10-CM

## 2021-05-02 NOTE — Therapy (Signed)
[place] MAIN [facility] SERVICES [address] [place], [place], [address] Phone: [phone]   Fax:  [phone]  Physical Therapy Treatment  Patient Details  Name: Jose Castro MRN: [id number] Date of Birth: 04/11/1926 Referring Provider (PT): Dr. Ancil Boozer   Encounter Date: 05/02/2021   PT End of Session - 05/02/21 1438    Visit Number 29    Number of Visits 47    Date for PT Re-Evaluation 05/13/21    Authorization Type eval: 09/20/20, Progress note 01/02/2021, PN on 1/0/1751, Recert = 0/25/8527-7/82/4235; PN on 4/202022    PT Start Time 1440    PT Stop Time 1509    PT Time Calculation (min) 29 min    Equipment Utilized During Treatment Gait belt    Activity Tolerance Patient tolerated treatment well    Behavior During Therapy [facility] for tasks assessed/performed           Past Medical History:  Diagnosis Date  . Allergy    Penicillin  . Arthritis   . Basal cell carcinoma   . BPH (benign prostatic hyperplasia)   . Chronic kidney disease    had a kidney stone which per family was a cyst that was removed  . Dysrhythmia   . GERD (gastroesophageal reflux disease)   . History of kidney stones   . Hyperlipidemia   . Hypertension   . Left-sided low back pain with left-sided sciatica   . Lung mass   . Splenic vein thrombosis   . Stroke ([place]) 05/22/2019  . Thrombosis 12/2015   mural  area and no notation of heart attack    Past Surgical History:  Procedure Laterality Date  . APPENDECTOMY    . CATARACT EXTRACTION, BILATERAL    . CYSTOSCOPY WITH INSERTION OF UROLIFT    . ENDOSCOPIC RETROGRADE CHOLANGIOPANCREATOGRAPHY (ERCP) WITH PROPOFOL N/A 05/11/2020   Procedure: ENDOSCOPIC RETROGRADE CHOLANGIOPANCREATOGRAPHY (ERCP) WITH PROPOFOL;  Surgeon: [name], MD;  Location: [facility] ENDOSCOPY;  Service: Endoscopy;  Laterality: N/A;  . ERCP N/A 10/23/2020   Procedure: ENDOSCOPIC RETROGRADE CHOLANGIOPANCREATOGRAPHY (ERCP);  Surgeon: [name], MD;   Location: [facility] ENDOSCOPY;  Service: Endoscopy;  Laterality: N/A;  . EYE SURGERY  08/2018  . IR EXCHANGE BILIARY DRAIN  05/04/2020  . KIDNEY STONE SURGERY    . KNEE SURGERY    . PACEMAKER INSERTION N/A 07/13/2018   Procedure: INSERTION PACEMAKER-DUEL CHAMBER INITIAL IMPLANT;  Surgeon: Isaias Cowman, MD;  Location: [facility] ORS;  Service: Cardiovascular;  Laterality: N/A;  . SPINE SURGERY      There were no vitals filed for this visit.   Subjective Assessment - 05/02/21 1437    Subjective Patient denies any pain today and reports walking okay with using his walker.    Patient is accompained by: Family member    Pertinent History Pt referred for deconditioning and gait instability. He has had a decline since he last finished therapy. Pt had a hospital admission from 05/02/20 to 05/14/20 for acute cholecystitis and is now s/p cholecystostomy drain placement. Drain is still in place and pt is waiting to hear when they plan to remove it. Pt discharged to SNF and then returned home. In addition pt underwent radiation therapy for a RUL lung mass/cancer and had his last radiation treatment in September of 2021. He reports that he has continued to walk at home as his primary exercise. Pt was previously seen at this clinic for PT/OT due to weakness/imbalance s/p CVA. Prior history from 09/21/19: Pt is a  87 year old male who presents with imbalance and difficulty with gait following a right frontal CVA with left hemiparesis on 05/22/19.  He has completed inpatient and home health PT, and now presents for OP PT.  He was discharged from inpatient rehab at a supervision level of assistance, ambulating with a RW.  His family notes that he is impulsive and will try to ambulate without his RW at home.  Family is currently providing 24/7 supervision.    Limitations House hold activities;Walking    How long can you sit comfortably? no limitations    How long can you walk comfortably? he requires a RW    Patient Stated  Goals Pt wants to be able to walk his granddaughter down the aisle at her wedding November 2022    Currently in Pain? No/denies    Pain Onset More than a month ago             Interventions:  Supine- Passive Knee flex ROM (patient with tightness Left knee vs. Right) x 2 min each leg  Supine- Lower trunk rotation - hold 30 sec x 3 sets each leg  Bridging 2 sets of 10 reps - VC to extend hips as much as possible - Patient did improve with VC/TC.  Supine UE shoulder flex (for postural stretch of pectorals) and UE strength- Cues to go back as far as you can. - hold 30 sec x 3 sets  Supine chin tuck - 10 reps at 5 sec hold- VC/TC for correct technique.   Sit to stand x 10 reps with min UE Support (mild difficulty with left knee soreness reported)   Step ups onto 6" box x 10 B (increased difficulty left LE due to knee weakness/soreness)   Terminal Knee ext= GTB BLE  2 sets of 10 reps each- Mild difficulty with left LE vs. Right.     Gait assessment: patient ambulated after stretching utilizing 4WW approx 200 feet stating he felt more flexible and less stiff. He continued to required VC for posture as he was still forward flexed posture.   Clinical Impression: Patient participated well today - accepting all VC and TC for postural strengthening and stretching. Patient experienced weakness more with left LE during step up and TKE today but did improve some with Cues to extend knee and concentrate. No visual improvement with gait after stretching and did not measure walking speed but patient reported feeling better after being stretched and stated he was more comfortable- "felt Looser" Pt will benefit from further skilled PT to improve BLE strength, balance and functional mobility                      PT Education - 05/02/21 1438    Education Details specific postural cues with activities, ROM, and strengthening    Person(s) Educated Patient    Methods  Explanation;Demonstration;Tactile cues;Verbal cues    Comprehension Verbalized understanding;Returned demonstration;Verbal cues required;Tactile cues required;Need further instruction            PT Short Term Goals - 01/21/21 1438      PT SHORT TERM GOAL #1   Title Pt will be independent with HEP in order to improve strength and balance in order to decrease fall risk and improve function at home.    Baseline 01/02/2021- Patient verbalize understanding of walking and seated exercises for home program. 01/21/21 patient has been more active and understands HEP.    Time 6    Period Weeks  Status Achieved    Target Date 01/10/21             PT Long Term Goals - 03/20/21 1136      PT LONG TERM GOAL #1   Title Pt will improve BERG by at least 3 points in order to demonstrate clinically significant improvement in balance.    Baseline 09/20/20: 32/56 (Previous discharge: 03/06/20: 46/56), 12/14: 32/56, 1/13: 31/56, 01/16/21 35/56, 02/06/2021=36/56. 02/18/2021= 37/56.03/20/2021- Deferred secondary to left knee aching- will attempt next visit.    Time 12    Period Weeks    Status On-going    Target Date 05/13/21      PT LONG TERM GOAL #2   Title Pt will decrease TUG to below 14 seconds/decrease in order to demonstrate decreased fall risk.    Baseline 09/20/20: 19.2s, 12/14: 17.48, 1/13: 16.65 sec. 01/02/2021=15.75 sec using 4WW, 01/21/21  14.37 seconds. 02/06/2021= 14.92 sec using 4WW. 02/18/2021= 14.5 sec using 4WW. 03/20/2021- Deferred secondary to left knee aching- will attempt next visit.    Time 12    Period Weeks    Status On-going    Target Date 05/13/21      PT LONG TERM GOAL #3   Title Pt will decrease 5TSTS by at least 3 seconds in order to demonstrate clinically significant improvement in LE strength.    Baseline 09/20/20: 23.1s with minA+1 for strength and balance (Previous discharge 03/06/20: 16.1s), 12/14: 20 sec, 1/13: 22 sec.   01/02/2021= 16.05 sec with light UE support. 01/21/21  01/21/21 37.90 seconds without UE, with UE support 12.93 seconds: 02/06/2021= with 1 UE 17.93 sec. 02/18/2021= 21.33 sec without UE support.    Time 12    Period Weeks    Status On-going    Target Date 05/13/21      PT LONG TERM GOAL #4   Title Pt will increase his self-selected 10MWT to >0.74 m/s in order to demonstrate improvement in gait speed and improved community ambulation    Baseline 09/20/20: self-selected: 14.4s = 0.69 m/s with rollator (Previous discharge 03/06/20: self-selected: 13.6s = 0.74 m/s), 12/14: 0.75 m/s, 1/13: 0.78 m/s. 01/02/2021= 0.85 m/s using 4WW, 01/16/21 with 4 WW .99 m/s. 02/06/2021= self selected = 0.93 m/s with 3NT    Time 4    Period Weeks    Status Achieved      PT LONG TERM GOAL #5   Title Patient will increase six minute walk test distance to >1000 for progression to community ambulator and improve gait ability    Baseline 01/10/20  620 ft with rollator, 01/21/21  965 ft; 02/06/2021= 910 feet with 4WW.  02/18/2021= 950 feet with 4WW    Time 12    Period Weeks    Status On-going    Target Date 05/13/21                 Plan - 05/02/21 1444    Clinical Impression Statement Patient participated well today - accepting all VC and TC for postural strengthening and stretching. Patient experienced weakness more with left LE during step up and TKE today but did improve some with Cues to extend knee and concentrate. No visual improvement with gait after stretching and did not measure walking speed but patient reported feeling better after being stretched and stated he was more comfortable- "felt Looser" Pt will benefit from further skilled PT to improve BLE strength, balance and functional mobility    Personal Factors and Comorbidities Age;Comorbidity 3+    Comorbidities HTN,  CVA, cholecystitis with cholecystostomy drain    Examination-Activity Limitations Bend;Lift;Squat;Locomotion Level;Stairs;Stand    Examination-Participation Restrictions Community  Activity;Driving;Laundry;Meal Prep;Shop    Stability/Clinical Decision Making Unstable/Unpredictable    Rehab Potential Fair    PT Frequency 2x / week    PT Duration 12 weeks    PT Treatment/Interventions ADLs/Self Care Home Management;Cryotherapy;Electrical Stimulation;Iontophoresis 4mg /ml Dexamethasone;Moist Heat;Traction;Ultrasound;DME Instruction;Gait training;Stair training;Therapeutic activities;Therapeutic exercise;Functional mobility training;Balance training;Neuromuscular re-education;Patient/family education;Manual techniques;Dry needling;Vestibular;Joint Manipulations;Spinal Manipulations;Aquatic Therapy    PT Next Visit Plan Continue to focus on LE strengthening and balance, dynamic balance tasks    PT Home Exercise Plan no changes    Consulted and Agree with Plan of Care Patient;Family member/caregiver           Patient will benefit from skilled therapeutic intervention in order to improve the following deficits and impairments:  Abnormal gait,Decreased balance,Decreased mobility,Difficulty walking,Decreased knowledge of precautions,Decreased safety awareness,Decreased activity tolerance,Decreased strength,Decreased endurance  Visit Diagnosis: Abnormality of gait and mobility  Difficulty in walking, not elsewhere classified  Muscle weakness (generalized)     Problem List Patient Active Problem List   Diagnosis Date Noted  . Moderate protein-calorie malnutrition ([place]) 03/11/2021  . Hemiparesis affecting left side as late effect of cerebrovascular accident (CVA) ([place]) 03/11/2021  . Aneurysm, aorta, thoracic ([place]) 12/21/2020  . Calculus of bile duct without cholecystitis and without obstruction   . Non-small cell carcinoma of lung, right ([place]) 09/07/2020  . Mass of upper lobe of right lung 07/13/2020  . Calculus of bile duct with cholecystitis without obstruction   . Abnormal findings on imaging of biliary tract   . Acute cholecystitis 05/02/2020  . Chronic venous  insufficiency 12/05/2019  . 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 CKD (chronic kidney disease), stage II   . New onset atrial fibrillation ([place])   . 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 Goals of care, counseling/discussion   . Palliative care by specialist   . DNR (do not resuscitate) discussion   . History of CVA (cerebrovascular accident) 05/22/2019  . Persistent proteinuria 07/30/2018  . 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 Chronic kidney disease, stage III (moderate) ([place]) 06/15/2018  . Leg pain 05/06/2018  . Lymphedema 05/06/2018  . Coagulopathy ([place]) 07/07/2017  . Lumbar spondylosis 03/10/2017  . Elevated uric acid in blood 11/20/2016  . BPH (benign prostatic hyperplasia) 05/29/2016  . Peripheral vascular disease of lower extremity ([place]) 03/03/2016  . Gallstone 12/31/2015  . 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 Atherosclerosis of aorta ([place]) 11/22/2015  . Carotid artery narrowing 11/22/2015  . Diverticulosis of colon 11/22/2015  . Decreased creatinine clearance 11/22/2015  . Arthritis, degenerative 11/22/2015  . Lactose intolerance 11/22/2015  . Basal cell carcinoma 11/22/2015  . Essential hypertension 06/21/2015  . Hyperlipemia 06/21/2015  . GERD (gastroesophageal reflux disease) 06/21/2015  . Calculus of kidney 11/18/2013    Lewis Moccasin, PT 05/03/2021, 11:47 AM  [place] MAIN [facility] SERVICES [address] [place], [place], [address] Phone: [phone]   Fax:  [phone]  Name: ISAISH ALEMU MRN: [id number] Date of Birth: 1926-04-10

## 2021-05-06 ENCOUNTER — Ambulatory Visit: Payer: Medicare Other

## 2021-05-06 ENCOUNTER — Other Ambulatory Visit: Payer: Self-pay

## 2021-05-06 DIAGNOSIS — R262 Difficulty in walking, not elsewhere classified: Secondary | ICD-10-CM

## 2021-05-06 DIAGNOSIS — R269 Unspecified abnormalities of gait and mobility: Secondary | ICD-10-CM

## 2021-05-06 DIAGNOSIS — M6281 Muscle weakness (generalized): Secondary | ICD-10-CM

## 2021-05-06 NOTE — Therapy (Signed)
[place] MAIN [facility] SERVICES [address] [place], [place], [address] Phone: [phone]   Fax:  [phone]  Physical Therapy Treatment/Physical Therapy Progress Note   Dates of reporting period  03/20/2021 to   05/06/2021  Patient Details  Name: Jose Castro MRN: [id number] Date of Birth: 04-30-1926 Referring Provider (PT): Dr. Ancil Boozer   Encounter Date: 05/06/2021   PT End of Session - 05/06/21 1112    Visit Number 30    Number of Visits 40    Date for PT Re-Evaluation 05/13/21    Authorization Type eval: 09/20/20, Progress note 01/02/2021, PN on 03/04/8184, Recert = 6/31/4970-2/63/7858; PN on 4/202022    PT Start Time 1103    PT Stop Time 1135    PT Time Calculation (min) 32 min    Equipment Utilized During Treatment Gait belt    Activity Tolerance Patient tolerated treatment well    Behavior During Therapy [facility] for tasks assessed/performed           Past Medical History:  Diagnosis Date  . Allergy    Penicillin  . Arthritis   . Basal cell carcinoma   . BPH (benign prostatic hyperplasia)   . Chronic kidney disease    had a kidney stone which per family was a cyst that was removed  . Dysrhythmia   . GERD (gastroesophageal reflux disease)   . History of kidney stones   . Hyperlipidemia   . Hypertension   . Left-sided low back pain with left-sided sciatica   . Lung mass   . Splenic vein thrombosis   . Stroke ([place]) 05/22/2019  . Thrombosis 12/2015   mural  area and no notation of heart attack    Past Surgical History:  Procedure Laterality Date  . APPENDECTOMY    . CATARACT EXTRACTION, BILATERAL    . CYSTOSCOPY WITH INSERTION OF UROLIFT    . ENDOSCOPIC RETROGRADE CHOLANGIOPANCREATOGRAPHY (ERCP) WITH PROPOFOL N/A 05/11/2020   Procedure: ENDOSCOPIC RETROGRADE CHOLANGIOPANCREATOGRAPHY (ERCP) WITH PROPOFOL;  Surgeon: [name], MD;  Location: [facility] ENDOSCOPY;  Service: Endoscopy;  Laterality: N/A;  . ERCP N/A 10/23/2020    Procedure: ENDOSCOPIC RETROGRADE CHOLANGIOPANCREATOGRAPHY (ERCP);  Surgeon: [name], MD;  Location: [facility] ENDOSCOPY;  Service: Endoscopy;  Laterality: N/A;  . EYE SURGERY  08/2018  . IR EXCHANGE BILIARY DRAIN  05/04/2020  . KIDNEY STONE SURGERY    . KNEE SURGERY    . PACEMAKER INSERTION N/A 07/13/2018   Procedure: INSERTION PACEMAKER-DUEL CHAMBER INITIAL IMPLANT;  Surgeon: Isaias Cowman, MD;  Location: [facility] ORS;  Service: Cardiovascular;  Laterality: N/A;  . SPINE SURGERY      There were no vitals filed for this visit.   Subjective Assessment - 05/06/21 1110    Subjective Patient reports having a good week with no major issues. Reports occasional left knee pain but none today.    Patient is accompained by: Family member    Pertinent History Pt referred for deconditioning and gait instability. He has had a decline since he last finished therapy. Pt had a hospital admission from 05/02/20 to 05/14/20 for acute cholecystitis and is now s/p cholecystostomy drain placement. Drain is still in place and pt is waiting to hear when they plan to remove it. Pt discharged to SNF and then returned home. In addition pt underwent radiation therapy for a RUL lung mass/cancer and had his last radiation treatment in September of 2021. He reports that he has continued to walk at home as his primary exercise. Pt  was previously seen at this clinic for PT/OT due to weakness/imbalance s/p CVA. Prior history from 09/21/19: Pt is a 87 year old male who presents with imbalance and difficulty with gait following a right frontal CVA with left hemiparesis on 05/22/19.  He has completed inpatient and home health PT, and now presents for OP PT.  He was discharged from inpatient rehab at a supervision level of assistance, ambulating with a RW.  His family notes that he is impulsive and will try to ambulate without his RW at home.  Family is currently providing 24/7 supervision.    Limitations House hold activities;Walking    How  long can you sit comfortably? no limitations    How long can you walk comfortably? he requires a RW    Patient Stated Goals Pt wants to be able to walk his granddaughter down the aisle at her wedding November 2022    Currently in Pain? No/denies    Pain Onset More than a month ago              [facility] PT Assessment - 05/06/21 1120      Berg Balance Test   Sit to Stand Able to stand without using hands and stabilize independently    Standing Unsupported Able to stand safely 2 minutes    Sitting with Back Unsupported but Feet Supported on Floor or Stool Able to sit safely and securely 2 minutes    Stand to Sit Controls descent by using hands    Transfers Able to transfer safely, minor use of hands    Standing Unsupported with Eyes Closed Able to stand 10 seconds with supervision    Standing Unsupported with Feet Together Able to place feet together independently and stand for 1 minute with supervision    From Standing, Reach Forward with Outstretched Arm Can reach forward >12 cm safely (5")    From Standing Position, Pick up Object from Floor Able to pick up shoe, needs supervision    From Standing Position, Turn to Look Behind Over each Shoulder Turn sideways only but maintains balance    Turn 360 Degrees Needs close supervision or verbal cueing    Standing Unsupported, Alternately Place Feet on Step/Stool Able to complete >2 steps/needs minimal assist    Standing Unsupported, One Foot in Front Able to plae foot ahead of the other independently and hold 30 seconds    Standing on One Leg Tries to lift leg/unable to hold 3 seconds but remains standing independently    Total Score 39           Interventions:   Assessed goals- See goal section for details.   Nustep LE only level 2 for 5 min with cues for ROM and to keep SPM > 60 SPM.   5XSTS: 15.3 sec with BUE Support and 21.03 sec without UE support.  TUG: 16.32 sec with 4 WW BERG: 39/56  Patient then had to go to bathroom and  needed to terminate treatment.   Clinical Impression: So overall, patient is making progress exhibiting improved dynamic standing balance- BERG improved to 39/56 and slight decrease in performance from last testing with 5x STS and TUG scores but overall improved. Treatment was limited secondary to patient having to go to bathroom during session today.  Patient's condition has the potential to improve in response to therapy. Maximum improvement is yet to be obtained. The anticipated improvement is attainable and reasonable in a generally predictable time.  PT Education - 05/06/21 1111    Education Details specific postural strengtehning    Person(s) Educated Patient    Methods Explanation;Demonstration;Tactile cues;Verbal cues    Comprehension Verbalized understanding;Returned demonstration;Verbal cues required;Tactile cues required;Need further instruction            PT Short Term Goals - 01/21/21 1438      PT SHORT TERM GOAL #1   Title Pt will be independent with HEP in order to improve strength and balance in order to decrease fall risk and improve function at home.    Baseline 01/02/2021- Patient verbalize understanding of walking and seated exercises for home program. 01/21/21 patient has been more active and understands HEP.    Time 6    Period Weeks    Status Achieved    Target Date 01/10/21             PT Long Term Goals - 05/06/21 1113      PT LONG TERM GOAL #1   Title Pt will improve BERG by at least 3 points in order to demonstrate clinically significant improvement in balance.    Baseline 09/20/20: 32/56 (Previous discharge: 03/06/20: 46/56), 12/14: 32/56, 1/13: 31/56, 01/16/21 35/56, 02/06/2021=36/56. 02/18/2021= 37/56.03/20/2021- Deferred secondary to left knee aching- will attempt next visit. 05/06/2021= 39/56    Time 12    Period Weeks    Status On-going    Target Date 05/13/21      PT LONG TERM GOAL #2   Title Pt will decrease TUG  to below 14 seconds/decrease in order to demonstrate decreased fall risk.    Baseline 09/20/20: 19.2s, 12/14: 17.48, 1/13: 16.65 sec. 01/02/2021=15.75 sec using 4WW, 01/21/21  14.37 seconds. 02/06/2021= 14.92 sec using 4WW. 02/18/2021= 14.5 sec using 4WW. 03/20/2021- Deferred secondary to left knee aching- will attempt next visit. 05/06/2021= 16.32 sec avg using 4WW.    Time 12    Period Weeks    Status On-going    Target Date 05/13/21      PT LONG TERM GOAL #3   Title Pt will decrease 5TSTS by at least 3 seconds in order to demonstrate clinically significant improvement in LE strength.    Baseline 09/20/20: 23.1s with minA+1 for strength and balance (Previous discharge 03/06/20: 16.1s), 12/14: 20 sec, 1/13: 22 sec.   01/02/2021= 16.05 sec with light UE support. 01/21/21 01/21/21 37.90 seconds without UE, with UE support 12.93 seconds: 02/06/2021= with 1 UE 17.93 sec. 02/18/2021= 21.33 sec without UE support. 05/06/2021= 15.3 sec with BUE Support and 21. 0 sec without UE support. .    Time 12    Period Weeks    Status On-going    Target Date 05/13/21      PT LONG TERM GOAL #4   Title Pt will increase his self-selected 10MWT to >0.74 m/s in order to demonstrate improvement in gait speed and improved community ambulation    Baseline 09/20/20: self-selected: 14.4s = 0.69 m/s with rollator (Previous discharge 03/06/20: self-selected: 13.6s = 0.74 m/s), 12/14: 0.75 m/s, 1/13: 0.78 m/s. 01/02/2021= 0.85 m/s using 4WW, 01/16/21 with 4 WW .99 m/s. 02/06/2021= self selected = 0.93 m/s with 4WH    Time 4    Period Weeks    Status Achieved      PT LONG TERM GOAL #5   Title Patient will increase six minute walk test distance to >1000 for progression to community ambulator and improve gait ability    Baseline 01/10/20  620 ft with rollator, 01/21/21  965 ft; 02/06/2021=  910 feet with 4WW.  02/18/2021= 950 feet with 4WW. 05/06/2021= will test next viist    Time 12    Period Weeks    Status On-going    Target Date 05/13/21                  Plan - 05/06/21 1112    Clinical Impression Statement So overall, patient is making progress exhibiting improved dynamic standing balance- BERG improved to 39/56 and slight decrease in performance from last testing with 5x STS and TUG scores but overall improved. Treatment was limited secondary to patient having to go to bathroom during session today.  Patient's condition has the potential to improve in response to therapy. Maximum improvement is yet to be obtained. The anticipated improvement is attainable and reasonable in a generally predictable time.    Personal Factors and Comorbidities Age;Comorbidity 3+    Comorbidities HTN, CVA, cholecystitis with cholecystostomy drain    Examination-Activity Limitations Bend;Lift;Squat;Locomotion Level;Stairs;Stand    Examination-Participation Restrictions Community Activity;Driving;Laundry;Meal Prep;Shop    Stability/Clinical Decision Making Unstable/Unpredictable    Rehab Potential Fair    PT Frequency 2x / week    PT Duration 12 weeks    PT Treatment/Interventions ADLs/Self Care Home Management;Cryotherapy;Electrical Stimulation;Iontophoresis 4mg /ml Dexamethasone;Moist Heat;Traction;Ultrasound;DME Instruction;Gait training;Stair training;Therapeutic activities;Therapeutic exercise;Functional mobility training;Balance training;Neuromuscular re-education;Patient/family education;Manual techniques;Dry needling;Vestibular;Joint Manipulations;Spinal Manipulations;Aquatic Therapy    PT Next Visit Plan Continue to focus on LE strengthening and balance, dynamic balance tasks    PT Home Exercise Plan no changes    Consulted and Agree with Plan of Care Patient;Family member/caregiver           Patient will benefit from skilled therapeutic intervention in order to improve the following deficits and impairments:  Abnormal gait,Decreased balance,Decreased mobility,Difficulty walking,Decreased knowledge of precautions,Decreased safety  awareness,Decreased activity tolerance,Decreased strength,Decreased endurance  Visit Diagnosis: Abnormality of gait and mobility  Difficulty in walking, not elsewhere classified  Muscle weakness (generalized)     Problem List Patient Active Problem List   Diagnosis Date Noted  . Moderate protein-calorie malnutrition ([place]) 03/11/2021  . Hemiparesis affecting left side as late effect of cerebrovascular accident (CVA) ([place]) 03/11/2021  . Aneurysm, aorta, thoracic ([place]) 12/21/2020  . Calculus of bile duct without cholecystitis and without obstruction   . Non-small cell carcinoma of lung, right ([place]) 09/07/2020  . Mass of upper lobe of right lung 07/13/2020  . Calculus of bile duct with cholecystitis without obstruction   . Abnormal findings on imaging of biliary tract   . Acute cholecystitis 05/02/2020  . Chronic venous insufficiency 12/05/2019  . 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 CKD (chronic kidney disease), stage II   . New onset atrial fibrillation ([place])   . 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 Goals of care, counseling/discussion   . Palliative care by specialist   . DNR (do not resuscitate) discussion   . History of CVA (cerebrovascular accident) 05/22/2019  . Persistent proteinuria 07/30/2018  . 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 Chronic kidney disease, stage III (moderate) ([place]) 06/15/2018  . Leg pain 05/06/2018  . Lymphedema 05/06/2018  . Coagulopathy ([place]) 07/07/2017  . Lumbar spondylosis 03/10/2017  . Elevated uric acid in blood 11/20/2016  . BPH (benign prostatic hyperplasia) 05/29/2016  . Peripheral vascular disease of lower extremity ([place]) 03/03/2016  . Gallstone 12/31/2015  . 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 Atherosclerosis of aorta ([place]) 11/22/2015  . Carotid artery  narrowing 11/22/2015  . Diverticulosis of colon 11/22/2015  . Decreased creatinine clearance 11/22/2015   . Arthritis, degenerative 11/22/2015  . Lactose intolerance 11/22/2015  . Basal cell carcinoma 11/22/2015  . Essential hypertension 06/21/2015  . Hyperlipemia 06/21/2015  . GERD (gastroesophageal reflux disease) 06/21/2015  . Calculus of kidney 11/18/2013    Lewis Moccasin, PT 05/06/2021, 3:04 PM  [place] MAIN [facility] SERVICES [address] [place], [place], [address] Phone: [phone]   Fax:  [phone]  Name: Jose Castro MRN: [id number] Date of Birth: 1926-04-24

## 2021-05-08 ENCOUNTER — Other Ambulatory Visit: Payer: Self-pay | Admitting: General Surgery

## 2021-05-08 ENCOUNTER — Ambulatory Visit: Payer: Medicare Other

## 2021-05-08 ENCOUNTER — Other Ambulatory Visit: Payer: Self-pay

## 2021-05-08 DIAGNOSIS — R269 Unspecified abnormalities of gait and mobility: Secondary | ICD-10-CM

## 2021-05-08 DIAGNOSIS — R278 Other lack of coordination: Secondary | ICD-10-CM

## 2021-05-08 DIAGNOSIS — R262 Difficulty in walking, not elsewhere classified: Secondary | ICD-10-CM

## 2021-05-08 DIAGNOSIS — M6281 Muscle weakness (generalized): Secondary | ICD-10-CM

## 2021-05-08 DIAGNOSIS — K819 Cholecystitis, unspecified: Secondary | ICD-10-CM

## 2021-05-08 NOTE — Therapy (Signed)
[place] MAIN [facility] SERVICES [address] [place], [place], [address] Phone: [phone]   Fax:  [phone]  Physical Therapy Treatment  Patient Details  Name: Jose Castro MRN: [id number] Date of Birth: Jun 03, 1926 Referring Provider (PT): Dr. Ancil Boozer   Encounter Date: 05/08/2021   PT End of Session - 05/08/21 1208    Visit Number 31    Number of Visits 64    Date for PT Re-Evaluation 05/13/21    Authorization Type eval: 09/20/20, Progress note 01/02/2021, PN on 01/07/2535, Recert = [id number]/44/[id number]/25/9562; PN on 4/202022    PT Start Time 1105    PT Stop Time 1146    PT Time Calculation (min) 41 min    Equipment Utilized During Treatment Gait belt    Activity Tolerance Patient tolerated treatment well    Behavior During Therapy [facility] for tasks assessed/performed           Past Medical History:  Diagnosis Date  . Allergy    Penicillin  . Arthritis   . Basal cell carcinoma   . BPH (benign prostatic hyperplasia)   . Chronic kidney disease    had a kidney stone which per family was a cyst that was removed  . Dysrhythmia   . GERD (gastroesophageal reflux disease)   . History of kidney stones   . Hyperlipidemia   . Hypertension   . Left-sided low back pain with left-sided sciatica   . Lung mass   . Splenic vein thrombosis   . Stroke ([place]) 05/22/2019  . Thrombosis 12/2015   mural  area and no notation of heart attack    Past Surgical History:  Procedure Laterality Date  . APPENDECTOMY    . CATARACT EXTRACTION, BILATERAL    . CYSTOSCOPY WITH INSERTION OF UROLIFT    . ENDOSCOPIC RETROGRADE CHOLANGIOPANCREATOGRAPHY (ERCP) WITH PROPOFOL N/A 05/11/2020   Procedure: ENDOSCOPIC RETROGRADE CHOLANGIOPANCREATOGRAPHY (ERCP) WITH PROPOFOL;  Surgeon: [name], MD;  Location: [facility] ENDOSCOPY;  Service: Endoscopy;  Laterality: N/A;  . ERCP N/A 10/23/2020   Procedure: ENDOSCOPIC RETROGRADE CHOLANGIOPANCREATOGRAPHY (ERCP);  Surgeon: [name], MD;   Location: [facility] ENDOSCOPY;  Service: Endoscopy;  Laterality: N/A;  . EYE SURGERY  08/2018  . IR EXCHANGE BILIARY DRAIN  05/04/2020  . KIDNEY STONE SURGERY    . KNEE SURGERY    . PACEMAKER INSERTION N/A 07/13/2018   Procedure: INSERTION PACEMAKER-DUEL CHAMBER INITIAL IMPLANT;  Surgeon: Isaias Cowman, MD;  Location: [facility] ORS;  Service: Cardiovascular;  Laterality: N/A;  . SPINE SURGERY      There were no vitals filed for this visit.   Subjective Assessment - 05/08/21 1109    Subjective Patient reports he was doing well but that his left knee started bothering him once he came off the elevator    Patient is accompained by: Family member    Pertinent History Pt referred for deconditioning and gait instability. He has had a decline since he last finished therapy. Pt had a hospital admission from 05/02/20 to 05/14/20 for acute cholecystitis and is now s/p cholecystostomy drain placement. Drain is still in place and pt is waiting to hear when they plan to remove it. Pt discharged to SNF and then returned home. In addition pt underwent radiation therapy for a RUL lung mass/cancer and had his last radiation treatment in September of 2021. He reports that he has continued to walk at home as his primary exercise. Pt was previously seen at this clinic for PT/OT due to weakness/imbalance s/p CVA.  Prior history from 09/21/19: Pt is a 87 year old male who presents with imbalance and difficulty with gait following a right frontal CVA with left hemiparesis on 05/22/19.  He has completed inpatient and home health PT, and now presents for OP PT.  He was discharged from inpatient rehab at a supervision level of assistance, ambulating with a RW.  His family notes that he is impulsive and will try to ambulate without his RW at home.  Family is currently providing 24/7 supervision.    Limitations House hold activities;Walking    How long can you sit comfortably? no limitations    How long can you walk comfortably? he  requires a RW    Patient Stated Goals Pt wants to be able to walk his granddaughter down the aisle at her wedding November 2022    Pain Score 4     Pain Location Knee    Pain Orientation Left    Pain Descriptors / Indicators Aching    Pain Type Chronic pain    Pain Onset More than a month ago    Pain Frequency Constant           Interventions:  Nustep- LE and UE level 0 for 5 min. VC for sequencing and to have patient continue/maintain at pace of > 50 SPM   Obstacles placed in // bars:  Forward walking up and over 6 in box then up and over 1/2 white foam followed by athletic move- kicking a soccer ball into goal with 1 to 2 UE support.   Rockerboard- positioned in A/P direction- Initially attempting static stand to level board out- patient with mild difficulty - exhibiting more posterior lean requiring both VC and TC to correct for more anterior weight shift.   Sitting postural strengthening:  Scap row- Using Matrix cable system BUE - 7.5 lb 2 sets of 12 reps followed by 1 set of 12.5 lb.  Single arm horizontal shoulder abd (rear delt fly) using 2.5 lb. 2 sets of 10 reps.  Education provided throughout session via VC/TC and demonstration to facilitate movement at target joints and correct muscle activation and posture alignment for all testing and exercises performed.  Standing wall posture activity- leaning forward holding onto PVC pipe then lifting up and overhead back toward wall. Patient able to achieve lifting pipe overhead but unable to achieve pressing back toward wall - exhibiting increased chest and shoulder tightness.   Clinical Impression: Patient participated well today with all balance/posture activities and performed well with minimal cueing today. He was able to progress with resistive Strengthening without difficulty stating "It feels good to work my shoulders and back - feels like I doing some good." Pt will benefit from further skilled PT to improve BLE strength,  balance and functional mobility.                        PT Education - 05/08/21 1207    Education Details posture and balance education    Person(s) Educated Patient    Methods Explanation;Demonstration;Tactile cues;Verbal cues    Comprehension Verbalized understanding;Returned demonstration;Verbal cues required;Need further instruction            PT Short Term Goals - 01/21/21 1438      PT SHORT TERM GOAL #1   Title Pt will be independent with HEP in order to improve strength and balance in order to decrease fall risk and improve function at home.    Baseline 01/02/2021- Patient verbalize understanding of  walking and seated exercises for home program. 01/21/21 patient has been more active and understands HEP.    Time 6    Period Weeks    Status Achieved    Target Date 01/10/21             PT Long Term Goals - 05/06/21 1113      PT LONG TERM GOAL #1   Title Pt will improve BERG by at least 3 points in order to demonstrate clinically significant improvement in balance.    Baseline 09/20/20: 32/56 (Previous discharge: 03/06/20: 46/56), 12/14: 32/56, 1/13: 31/56, 01/16/21 35/56, 02/06/2021=36/56. 02/18/2021= 37/56.03/20/2021- Deferred secondary to left knee aching- will attempt next visit. 05/06/2021= 39/56    Time 12    Period Weeks    Status On-going    Target Date 05/13/21      PT LONG TERM GOAL #2   Title Pt will decrease TUG to below 14 seconds/decrease in order to demonstrate decreased fall risk.    Baseline 09/20/20: 19.2s, 12/14: 17.48, 1/13: 16.65 sec. 01/02/2021=15.75 sec using 4WW, 01/21/21  14.37 seconds. 02/06/2021= 14.92 sec using 4WW. 02/18/2021= 14.5 sec using 4WW. 03/20/2021- Deferred secondary to left knee aching- will attempt next visit. 05/06/2021= 16.32 sec avg using 4WW.    Time 12    Period Weeks    Status On-going    Target Date 05/13/21      PT LONG TERM GOAL #3   Title Pt will decrease 5TSTS by at least 3 seconds in order to demonstrate  clinically significant improvement in LE strength.    Baseline 09/20/20: 23.1s with minA+1 for strength and balance (Previous discharge 03/06/20: 16.1s), 12/14: 20 sec, 1/13: 22 sec.   01/02/2021= 16.05 sec with light UE support. 01/21/21 01/21/21 37.90 seconds without UE, with UE support 12.93 seconds: 02/06/2021= with 1 UE 17.93 sec. 02/18/2021= 21.33 sec without UE support. 05/06/2021= 15.3 sec with BUE Support and 21. 0 sec without UE support. .    Time 12    Period Weeks    Status On-going    Target Date 05/13/21      PT LONG TERM GOAL #4   Title Pt will increase his self-selected 10MWT to >0.74 m/s in order to demonstrate improvement in gait speed and improved community ambulation    Baseline 09/20/20: self-selected: 14.4s = 0.69 m/s with rollator (Previous discharge 03/06/20: self-selected: 13.6s = 0.74 m/s), 12/14: 0.75 m/s, 1/13: 0.78 m/s. 01/02/2021= 0.85 m/s using 4WW, 01/16/21 with 4 WW .99 m/s. 02/06/2021= self selected = 0.93 m/s with 6EX    Time 4    Period Weeks    Status Achieved      PT LONG TERM GOAL #5   Title Patient will increase six minute walk test distance to >1000 for progression to community ambulator and improve gait ability    Baseline 01/10/20  620 ft with rollator, 01/21/21  965 ft; 02/06/2021= 910 feet with 4WW.  02/18/2021= 950 feet with 4WW. 05/06/2021= will test next viist    Time 12    Period Weeks    Status On-going    Target Date 05/13/21                 Plan - 05/08/21 1205    Clinical Impression Statement Patient participated well today with all balance/posture activities and performed well with minimal cueing today. He was able to progress with resistive Strengthening without difficulty stating "It feels good to work my shoulders and back - feels like I doing some  good." Pt will benefit from further skilled PT to improve BLE strength, balance and functional mobility    Personal Factors and Comorbidities Age;Comorbidity 3+    Comorbidities HTN, CVA, cholecystitis  with cholecystostomy drain    Examination-Activity Limitations Bend;Lift;Squat;Locomotion Level;Stairs;Stand    Examination-Participation Restrictions Community Activity;Driving;Laundry;Meal Prep;Shop    Stability/Clinical Decision Making Unstable/Unpredictable    Rehab Potential Fair    PT Frequency 2x / week    PT Duration 12 weeks    PT Treatment/Interventions ADLs/Self Care Home Management;Cryotherapy;Electrical Stimulation;Iontophoresis 4mg /ml Dexamethasone;Moist Heat;Traction;Ultrasound;DME Instruction;Gait training;Stair training;Therapeutic activities;Therapeutic exercise;Functional mobility training;Balance training;Neuromuscular re-education;Patient/family education;Manual techniques;Dry needling;Vestibular;Joint Manipulations;Spinal Manipulations;Aquatic Therapy    PT Next Visit Plan Continue to focus on LE strengthening and balance, dynamic balance tasks    PT Home Exercise Plan no changes    Consulted and Agree with Plan of Care Patient;Family member/caregiver           Patient will benefit from skilled therapeutic intervention in order to improve the following deficits and impairments:  Abnormal gait,Decreased balance,Decreased mobility,Difficulty walking,Decreased knowledge of precautions,Decreased safety awareness,Decreased activity tolerance,Decreased strength,Decreased endurance  Visit Diagnosis: Abnormality of gait and mobility  Difficulty in walking, not elsewhere classified  Muscle weakness (generalized)  Other lack of coordination     Problem List Patient Active Problem List   Diagnosis Date Noted  . Moderate protein-calorie malnutrition ([place]) 03/11/2021  . Hemiparesis affecting left side as late effect of cerebrovascular accident (CVA) ([place]) 03/11/2021  . Aneurysm, aorta, thoracic ([place]) 12/21/2020  . Calculus of bile duct without cholecystitis and without obstruction   . Non-small cell carcinoma of lung, right ([place]) 09/07/2020  . Mass of upper lobe of  right lung 07/13/2020  . Calculus of bile duct with cholecystitis without obstruction   . Abnormal findings on imaging of biliary tract   . Acute cholecystitis 05/02/2020  . Chronic venous insufficiency 12/05/2019  . 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 CKD (chronic kidney disease), stage II   . New onset atrial fibrillation ([place])   . 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 Goals of care, counseling/discussion   . Palliative care by specialist   . DNR (do not resuscitate) discussion   . History of CVA (cerebrovascular accident) 05/22/2019  . Persistent proteinuria 07/30/2018  . 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 Chronic kidney disease, stage III (moderate) ([place]) 06/15/2018  . Leg pain 05/06/2018  . Lymphedema 05/06/2018  . Coagulopathy ([place]) 07/07/2017  . Lumbar spondylosis 03/10/2017  . Elevated uric acid in blood 11/20/2016  . BPH (benign prostatic hyperplasia) 05/29/2016  . Peripheral vascular disease of lower extremity ([place]) 03/03/2016  . Gallstone 12/31/2015  . 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 Atherosclerosis of aorta ([place]) 11/22/2015  . Carotid artery narrowing 11/22/2015  . Diverticulosis of colon 11/22/2015  . Decreased creatinine clearance 11/22/2015  . Arthritis, degenerative 11/22/2015  . Lactose intolerance 11/22/2015  . Basal cell carcinoma 11/22/2015  . Essential hypertension 06/21/2015  . Hyperlipemia 06/21/2015  . GERD (gastroesophageal reflux disease) 06/21/2015  . Calculus of kidney 11/18/2013    Lewis Moccasin, PT 05/08/2021, 12:11 PM  [place] MAIN [facility] SERVICES [address] [place], [place], [address] Phone: [phone]   Fax:  [phone]  Name: ARLOW SPIERS MRN: [id number] Date of Birth: [birth year]

## 2021-05-10 ENCOUNTER — Ambulatory Visit
Admission: RE | Admit: 2021-05-10 | Discharge: 2021-05-10 | Disposition: A | Discharge status: Home / Self Care | Payer: Medicare Other | Source: Ambulatory Visit | Attending: General Surgery | Admitting: General Surgery

## 2021-05-10 ENCOUNTER — Other Ambulatory Visit: Payer: Self-pay

## 2021-05-10 DIAGNOSIS — K819 Cholecystitis, unspecified: Secondary | ICD-10-CM | POA: Diagnosis not present

## 2021-05-13 ENCOUNTER — Other Ambulatory Visit: Payer: Self-pay

## 2021-05-13 ENCOUNTER — Ambulatory Visit: Payer: Medicare Other

## 2021-05-13 DIAGNOSIS — M6281 Muscle weakness (generalized): Secondary | ICD-10-CM

## 2021-05-13 DIAGNOSIS — R278 Other lack of coordination: Secondary | ICD-10-CM

## 2021-05-13 DIAGNOSIS — R269 Unspecified abnormalities of gait and mobility: Secondary | ICD-10-CM | POA: Diagnosis not present

## 2021-05-13 DIAGNOSIS — R262 Difficulty in walking, not elsewhere classified: Secondary | ICD-10-CM

## 2021-05-13 NOTE — Therapy (Signed)
[place] MAIN [facility] SERVICES [address] [place], [place], [address] Phone: [phone]   Fax:  [phone]  Physical Therapy Treatment/Recertification 63/14/9702- 08/05/2021  Patient Details  Name: Jose Castro MRN: [id number] Date of Birth: [birth year] Referring Provider (PT): Dr. Ancil Boozer   Encounter Date: 05/13/2021   PT End of Session - 05/13/21 1137     Visit Number 32    Number of Visits 51    Date for PT Re-Evaluation 05/13/21    Authorization Type eval: 09/20/20, Progress note 01/02/2021, PN on 01/07/7411, Recert = 8/78/6767-01/10/4708; PN on 4/202022; PN on 05/02/8365; Recert= 2/94/7654    PT Start Time 1103    PT Stop Time 1144    PT Time Calculation (min) 41 min    Equipment Utilized During Treatment Gait belt    Activity Tolerance Patient tolerated treatment well    Behavior During Therapy WFL for tasks assessed/performed             Past Medical History:  Diagnosis Date   Allergy    Penicillin   Arthritis    Basal cell carcinoma    BPH (benign prostatic hyperplasia)    Chronic kidney disease    had a kidney stone which per family was a cyst that was removed   Dysrhythmia    GERD (gastroesophageal reflux disease)    History of kidney stones    Hyperlipidemia    Hypertension    Left-sided low back pain with left-sided sciatica    Lung mass    Splenic vein thrombosis    Stroke ([place]) 05/22/2019   Thrombosis 12/2015   mural  area and no notation of heart attack    Past Surgical History:  Procedure Laterality Date   APPENDECTOMY     CATARACT EXTRACTION, BILATERAL     CYSTOSCOPY WITH INSERTION OF UROLIFT     ENDOSCOPIC RETROGRADE CHOLANGIOPANCREATOGRAPHY (ERCP) WITH PROPOFOL N/A 05/11/2020   Procedure: ENDOSCOPIC RETROGRADE CHOLANGIOPANCREATOGRAPHY (ERCP) WITH PROPOFOL;  Surgeon: [name], MD;  Location: [facility] ENDOSCOPY;  Service: Endoscopy;  Laterality: N/A;   ERCP N/A 10/23/2020   Procedure: ENDOSCOPIC RETROGRADE  CHOLANGIOPANCREATOGRAPHY (ERCP);  Surgeon: [name], MD;  Location: [facility] ENDOSCOPY;  Service: Endoscopy;  Laterality: N/A;   EYE SURGERY  08/2018   IR EXCHANGE BILIARY DRAIN  05/04/2020   KIDNEY STONE SURGERY     KNEE SURGERY     PACEMAKER INSERTION N/A 07/13/2018   Procedure: INSERTION PACEMAKER-DUEL CHAMBER INITIAL IMPLANT;  Surgeon: Isaias Cowman, MD;  Location: [facility] ORS;  Service: Cardiovascular;  Laterality: N/A;   SPINE SURGERY      There were no vitals filed for this visit.   Subjective Assessment - 05/13/21 1123     Subjective Patient reports doing okay with no significant changes. Reports ongoing Left LE weakness/pain.    Patient is accompained by: Family member    Pertinent History Pt referred for deconditioning and gait instability. He has had a decline since he last finished therapy. Pt had a hospital admission from 05/02/20 to 05/14/20 for acute cholecystitis and is now s/p cholecystostomy drain placement. Drain is still in place and pt is waiting to hear when they plan to remove it. Pt discharged to SNF and then returned home. In addition pt underwent radiation therapy for a RUL lung mass/cancer and had his last radiation treatment in September of 2021. He reports that he has continued to walk at home as his primary exercise. Pt was previously seen at this clinic for PT/OT due  to weakness/imbalance s/p CVA. Prior history from 09/21/19: Pt is a 87 year old male who presents with imbalance and difficulty with gait following a right frontal CVA with left hemiparesis on 05/22/19.  He has completed inpatient and home health PT, and now presents for OP PT.  He was discharged from inpatient rehab at a supervision level of assistance, ambulating with a RW.  His family notes that he is impulsive and will try to ambulate without his RW at home.  Family is currently providing 24/7 supervision.    Limitations House hold activities;Walking    How long can you sit comfortably? no limitations     How long can you walk comfortably? he requires a RW    Patient Stated Goals Pt wants to be able to walk his granddaughter down the aisle at her wedding November 2022    Currently in Pain? Yes    Pain Score 5     Pain Location Knee    Pain Orientation Left    Pain Descriptors / Indicators Aching    Pain Type Chronic pain    Pain Onset More than a month ago    Pain Frequency Constant    Aggravating Factors  Weight bearing    Pain Relieving Factors rest                [facility] PT Assessment - 05/13/21 1117       Berg Balance Test   Sit to Stand Able to stand without using hands and stabilize independently    Standing Unsupported Able to stand safely 2 minutes    Sitting with Back Unsupported but Feet Supported on Floor or Stool Able to sit safely and securely 2 minutes    Stand to Sit Sits safely with minimal use of hands    Transfers Able to transfer safely, minor use of hands    Standing Unsupported with Eyes Closed Able to stand 10 seconds with supervision    Standing Unsupported with Feet Together Able to place feet together independently and stand for 1 minute with supervision    From Standing, Reach Forward with Outstretched Arm Can reach forward >12 cm safely (5")    From Standing Position, Pick up Object from Floor Unable to pick up and needs supervision    From Standing Position, Turn to Look Behind Over each Shoulder Needs supervision when turning    Turn 360 Degrees Needs close supervision or verbal cueing    Standing Unsupported, Alternately Place Feet on Step/Stool Able to complete >2 steps/needs minimal assist    Standing Unsupported, One Foot in Front Able to take small step independently and hold 30 seconds    Standing on One Leg Tries to lift leg/unable to hold 3 seconds but remains standing independently    Total Score 36              Interventions:   Reassessed goals: * patient reported limited by left knee pain today.   BERG=36/56 TUG= 16.5 sec with  use of 4WW 5 x Sit to Stand= 15.0 sec with BUE support; 16 sec without UE support.   Sit to stand from mat, chair, seat of [place] with VC to scoot out to the edge of seat, use of UE support- patient able to improve with practice.   Clinical Impression: Overall- patient is demonstrating progress as seen by improved BERG since initial assessment; much improved 5 x STS test, and overall improvement in TUG. Patient remains limited by left knee pain which patient reported  as "Bothering me" today. Patient scores remain at risk of falling and his condition has the potential to improve in response to therapy. Maximum improvement is yet to be obtained. The anticipated improvement is attainable and reasonable in a generally predictable time.                        PT Education - 05/13/21 1137     Education Details specific exercise technique.    Person(s) Educated Patient    Methods Explanation;Demonstration;Tactile cues;Verbal cues    Comprehension Verbalized understanding;Returned demonstration;Verbal cues required;Tactile cues required;Need further instruction              PT Short Term Goals - 01/21/21 1438       PT SHORT TERM GOAL #1   Title Pt will be independent with HEP in order to improve strength and balance in order to decrease fall risk and improve function at home.    Baseline 01/02/2021- Patient verbalize understanding of walking and seated exercises for home program. 01/21/21 patient has been more active and understands HEP.    Time 6    Period Weeks    Status Achieved    Target Date 01/10/21               PT Long Term Goals - 05/13/21 1426       PT LONG TERM GOAL #1   Title Pt will improve BERG by at least 3 points in order to demonstrate clinically significant improvement in balance.    Baseline 09/20/20: 32/56 (Previous discharge: 03/06/20: 46/56), 12/14: 32/56, 1/13: 31/56, 01/16/21 35/56, 02/06/2021=36/56. 02/18/2021= 37/56.03/20/2021- Deferred secondary to  left knee aching- will attempt next visit. 05/06/2021= 39/56; 05/13/2021= 36/56 *patient having more left knee pain.    Time 12    Period Weeks    Status Achieved      PT LONG TERM GOAL #2   Title Pt will decrease TUG to below 14 seconds/decrease in order to demonstrate decreased fall risk.    Baseline 09/20/20: 19.2s, 12/14: 17.48, 1/13: 16.65 sec. 01/02/2021=15.75 sec using 4WW, 01/21/21  14.37 seconds. 02/06/2021= 14.92 sec using 4WW. 02/18/2021= 14.5 sec using 4WW. 03/20/2021- Deferred secondary to left knee aching- will attempt next visit. 05/06/2021= 16.32 sec avg using 4WW. 05/13/2021= 16.5 sec with 4WW *increased left knee pain.    Time 12    Period Weeks    Status On-going    Target Date 08/05/21      PT LONG TERM GOAL #3   Title Pt will decrease 5TSTS by at least 3 seconds in order to demonstrate clinically significant improvement in LE strength.    Baseline 09/20/20: 23.1s with minA+1 for strength and balance (Previous discharge 03/06/20: 16.1s), 12/14: 20 sec, 1/13: 22 sec.   01/02/2021= 16.05 sec with light UE support. 01/21/21 01/21/21 37.90 seconds without UE, with UE support 12.93 seconds: 02/06/2021= with 1 UE 17.93 sec. 02/18/2021= 21.33 sec without UE support. 05/06/2021= 15.3 sec with BUE Support and 21. 0 sec without UE support. 05/13/2021=15.0 sec with BUE support; 16 sec without UE support.    Time 12    Period Weeks    Status Achieved      PT LONG TERM GOAL #4   Title Pt will increase his self-selected 10MWT to >0.74 m/s in order to demonstrate improvement in gait speed and improved community ambulation    Baseline 09/20/20: self-selected: 14.4s = 0.69 m/s with rollator (Previous discharge 03/06/20: self-selected: 13.6s = 0.74 m/s), 12/14:  0.75 m/s, 1/13: 0.78 m/s. 01/02/2021= 0.85 m/s using 4WW, 01/16/21 with 4 WW .99 m/s. 02/06/2021= self selected = 0.93 m/s with 2LN    Time 4    Period Weeks    Status Achieved      PT LONG TERM GOAL #5   Title Patient will increase six minute walk test  distance to >1000 for progression to community ambulator and improve gait ability    Baseline 01/10/20  620 ft with rollator, 01/21/21  965 ft; 02/06/2021= 910 feet with 4WW.  02/18/2021= 950 feet with 4WW. 05/06/2021= will test next visit. 05/13/2021- Deferred secondary to increased left knee pain. Will keep goal active as patient was making significant progress.    Time 12    Period Weeks    Status On-going    Target Date 08/05/21      Additional Long Term Goals   Additional Long Term Goals Yes      PT LONG TERM GOAL #6   Title Patient will demonstrate ability to walk with HHA +1 > 200 ft. without loss of balance on level surfaces to simulate walking his grandaughter down the aisle for her upcoming wedding in November 2022.    Baseline 05/13/2021- Patient currently ambulating short community distance with use of 4WW but desires to walk with his grandaughter with HHA for her wedding.    Time 12    Period Weeks    Status New    Target Date 08/05/21      PT LONG TERM GOAL #7   Title Pt will decrease 5TSTS  to 14 sec or less with BUE support on armrest in order to demonstrate clinically significant improvement in LE strength.    Baseline 05/13/2021=5.0 sec with BUE support; 16 sec without UE support.    Time 12    Period Weeks    Status New    Target Date 08/05/21                   Plan - 05/13/21 1140     Clinical Impression Statement Overall- patient is demonstrating progress as seen by improved BERG since initial assessment; much improved 5 x STS test, and overall improvement in TUG. Patient remains limited by left knee pain which patient reported as "Bothering me" today. Patient scores remain at risk of falling and his condition has the potential to improve in response to therapy. Maximum improvement is yet to be obtained. The anticipated improvement is attainable and reasonable in a generally predictable time.    Personal Factors and Comorbidities Age;Comorbidity 3+    Comorbidities  HTN, CVA, cholecystitis with cholecystostomy drain    Examination-Activity Limitations Bend;Lift;Squat;Locomotion Level;Stairs;Stand    Examination-Participation Restrictions Community Activity;Driving;Laundry;Meal Prep;Shop    Stability/Clinical Decision Making Unstable/Unpredictable    Rehab Potential Fair    PT Frequency 2x / week    PT Duration 12 weeks    PT Treatment/Interventions ADLs/Self Care Home Management;Cryotherapy;Electrical Stimulation;Iontophoresis 4mg /ml Dexamethasone;Moist Heat;Traction;Ultrasound;DME Instruction;Gait training;Stair training;Therapeutic activities;Therapeutic exercise;Functional mobility training;Balance training;Neuromuscular re-education;Patient/family education;Manual techniques;Dry needling;Vestibular;Joint Manipulations;Spinal Manipulations;Aquatic Therapy    PT Next Visit Plan Continue to focus on LE strengthening and balance, dynamic balance tasks    PT Home Exercise Plan no changes    Consulted and Agree with Plan of Care Patient;Family member/caregiver             Patient will benefit from skilled therapeutic intervention in order to improve the following deficits and impairments:  Abnormal gait, Decreased balance, Decreased mobility, Difficulty walking, Decreased knowledge of  precautions, Decreased safety awareness, Decreased activity tolerance, Decreased strength, Decreased endurance  Visit Diagnosis: Abnormality of gait and mobility  Difficulty in walking, not elsewhere classified  Muscle weakness (generalized)  Other lack of coordination     Problem List Patient Active Problem List   Diagnosis Date Noted   Moderate protein-calorie malnutrition ([place]) 03/11/2021   Hemiparesis affecting left side as late effect of cerebrovascular accident (CVA) ([place]) 03/11/2021   Aneurysm, aorta, thoracic ([place]) 12/21/2020   Calculus of bile duct without cholecystitis and without obstruction    Non-small cell carcinoma of lung, right ([place]) 09/07/2020    Mass of upper lobe of right lung 07/13/2020   Calculus of bile duct with cholecystitis without obstruction    Abnormal findings on imaging of biliary tract    Acute cholecystitis 05/02/2020   Chronic venous insufficiency 12/05/2019   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    CKD (chronic kidney disease), stage II    New onset atrial fibrillation ([place])    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   Goals of care, counseling/discussion    Palliative care by specialist    DNR (do not resuscitate) discussion    History of CVA (cerebrovascular accident) 05/22/2019   Persistent proteinuria 07/30/2018   M[place]itz type 2 second degree heart block 07/13/2018   Chronic kidney disease, stage III (moderate) ([place]) 06/15/2018   Leg pain 05/06/2018   Lymphedema 05/06/2018   Coagulopathy ([place]) 07/07/2017   Lumbar spondylosis 03/10/2017   Elevated uric acid in blood 11/20/2016   BPH (benign prostatic hyperplasia) 05/29/2016   Peripheral vascular disease of lower extremity ([place]) 03/03/2016   Gallstone 12/31/2015   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   Atherosclerosis of aorta ([place]) 11/22/2015   Carotid artery narrowing 11/22/2015   Diverticulosis of colon 11/22/2015   Decreased creatinine clearance 11/22/2015   Arthritis, degenerative 11/22/2015   Lactose intolerance 11/22/2015   Basal cell carcinoma 11/22/2015   Essential hypertension 06/21/2015   Hyperlipemia 06/21/2015   GERD (gastroesophageal reflux disease) 06/21/2015   Calculus of kidney 11/18/2013    Lewis Moccasin, PT 05/14/2021, 2:45 PM  [place] MAIN [facility] SERVICES [address] [place], [place], [address] Phone: [phone]   Fax:  [phone]  Name: Jose Castro MRN: [id number] Date of Birth: [birth year]

## 2021-05-15 ENCOUNTER — Ambulatory Visit: Payer: Medicare Other

## 2021-05-15 ENCOUNTER — Other Ambulatory Visit: Payer: Self-pay

## 2021-05-15 DIAGNOSIS — M6281 Muscle weakness (generalized): Secondary | ICD-10-CM

## 2021-05-15 DIAGNOSIS — R278 Other lack of coordination: Secondary | ICD-10-CM

## 2021-05-15 DIAGNOSIS — R269 Unspecified abnormalities of gait and mobility: Secondary | ICD-10-CM

## 2021-05-15 DIAGNOSIS — R262 Difficulty in walking, not elsewhere classified: Secondary | ICD-10-CM

## 2021-05-15 NOTE — Therapy (Signed)
[place] MAIN [facility] SERVICES [address] [place], [place], [address] Phone: [phone]   Fax:  [phone]  Physical Therapy Treatment  Patient Details  Name: Jose Castro MRN: [id number] Date of Birth: June 30, 1926 Referring Provider (PT): Dr. Ancil Boozer   Encounter Date: 05/15/2021   PT End of Session - 05/16/21 0822     Visit Number 33    Number of Visits 41    Date for PT Re-Evaluation 05/13/21    Authorization Type eval: 09/20/20, Progress note 01/02/2021, PN on 06/03/1286, Recert = 8/67/6720-9/47/0962; PN on 4/202022; PN on 07/03/6628; Recert= 4/76/5465    PT Start Time 1100    PT Stop Time 1144    PT Time Calculation (min) 44 min    Equipment Utilized During Treatment Gait belt    Activity Tolerance Patient tolerated treatment well    Behavior During Therapy WFL for tasks assessed/performed             Past Medical History:  Diagnosis Date   Allergy    Penicillin   Arthritis    Basal cell carcinoma    BPH (benign prostatic hyperplasia)    Chronic kidney disease    had a kidney stone which per family was a cyst that was removed   Dysrhythmia    GERD (gastroesophageal reflux disease)    History of kidney stones    Hyperlipidemia    Hypertension    Left-sided low back pain with left-sided sciatica    Lung mass    Splenic vein thrombosis    Stroke ([place]) 05/22/2019   Thrombosis 12/2015   mural  area and no notation of heart attack    Past Surgical History:  Procedure Laterality Date   APPENDECTOMY     CATARACT EXTRACTION, BILATERAL     CYSTOSCOPY WITH INSERTION OF UROLIFT     ENDOSCOPIC RETROGRADE CHOLANGIOPANCREATOGRAPHY (ERCP) WITH PROPOFOL N/A 05/11/2020   Procedure: ENDOSCOPIC RETROGRADE CHOLANGIOPANCREATOGRAPHY (ERCP) WITH PROPOFOL;  Surgeon: [name], MD;  Location: [facility] ENDOSCOPY;  Service: Endoscopy;  Laterality: N/A;   ERCP N/A 10/23/2020   Procedure: ENDOSCOPIC RETROGRADE CHOLANGIOPANCREATOGRAPHY (ERCP);   Surgeon: [name], MD;  Location: [facility] ENDOSCOPY;  Service: Endoscopy;  Laterality: N/A;   EYE SURGERY  08/2018   IR EXCHANGE BILIARY DRAIN  05/04/2020   KIDNEY STONE SURGERY     KNEE SURGERY     PACEMAKER INSERTION N/A 07/13/2018   Procedure: INSERTION PACEMAKER-DUEL CHAMBER INITIAL IMPLANT;  Surgeon: Isaias Cowman, MD;  Location: [facility] ORS;  Service: Cardiovascular;  Laterality: N/A;   SPINE SURGERY      There were no vitals filed for this visit.   Subjective Assessment - 05/15/21 1111     Subjective Patient reports walking okay today. States grandson is helping him today and put on his brace well. Denies pain at rest- only with mobility.    Patient is accompained by: Family member    Pertinent History Pt referred for deconditioning and gait instability. He has had a decline since he last finished therapy. Pt had a hospital admission from 05/02/20 to 05/14/20 for acute cholecystitis and is now s/p cholecystostomy drain placement. Drain is still in place and pt is waiting to hear when they plan to remove it. Pt discharged to SNF and then returned home. In addition pt underwent radiation therapy for a RUL lung mass/cancer and had his last radiation treatment in September of 2021. He reports that he has continued to walk at home as his primary exercise. Pt  was previously seen at this clinic for PT/OT due to weakness/imbalance s/p CVA. Prior history from 09/21/19: Pt is a 87 year old male who presents with imbalance and difficulty with gait following a right frontal CVA with left hemiparesis on 05/22/19.  He has completed inpatient and home health PT, and now presents for OP PT.  He was discharged from inpatient rehab at a supervision level of assistance, ambulating with a RW.  His family notes that he is impulsive and will try to ambulate without his RW at home.  Family is currently providing 24/7 supervision.    Limitations House hold activities;Walking    How long can you sit comfortably? no  limitations    How long can you walk comfortably? he requires a RW    Patient Stated Goals Pt wants to be able to walk his granddaughter down the aisle at her wedding November 2022    Pain Onset More than a month ago            Therex:  Warmp up   Seated hip march 3lb. X 15 reps Seated knee ext 3lb x 15 reps Patient reports "easy" and denied pain   Seated hip flex/abd up and over spike 1/2 sphere- 2 sets of 15 reps.   Standing heel strike to toe off 2sets of 12 reps on left LE to simulate improved heel/toe strike- Increased VC   Sit to stand with purple pad placed in chair and no UE - x 10 reps x 2 sets- Patient performed well with pad without assist and no hesistation or multiple attempts.   Resistive walking using 4WW  and 3lb x 1 lap = 85min 38 sec Resistive walking using 4WW and no weight x 1 lap= 1 min 22 sec (improved heel strike)  Standing balance - dynamic thoracic rotation left to right x 15 reps each direction- no loss of balance.   Standing balance- hands on hips - trunk twist side to side x 10  - Increased unsteadiness.   Standing static on blue airex pad- Head turns (patient experienced loss of balance- mostly posteriorly)   Education provided throughout session via VC/TC and demonstration to facilitate movement at target joints and correct muscle activation for all testing and exercises performed.   Clinical impression: Patient performed well today with improving foot clearance but requires increased VC to perform correctly. He was able to perform heel to toe correctly during exercises and this translated well into gait after with and without ankle weight- although much improved without weight). Pt will benefit from further skilled PT to improve BLE strength, balance and functional mobility                        PT Education - 05/16/21 0821     Education Details balance ed and exercise technique/posture ed    Person(s) Educated Patient     Methods Explanation;Demonstration;Tactile cues;Verbal cues    Comprehension Verbalized understanding;Returned demonstration;Verbal cues required;Tactile cues required;Need further instruction              PT Short Term Goals - 01/21/21 1438       PT SHORT TERM GOAL #1   Title Pt will be independent with HEP in order to improve strength and balance in order to decrease fall risk and improve function at home.    Baseline 01/02/2021- Patient verbalize understanding of walking and seated exercises for home program. 01/21/21 patient has been more active and understands HEP.  Time 6    Period Weeks    Status Achieved    Target Date 01/10/21               PT Long Term Goals - 05/13/21 1426       PT LONG TERM GOAL #1   Title Pt will improve BERG by at least 3 points in order to demonstrate clinically significant improvement in balance.    Baseline 09/20/20: 32/56 (Previous discharge: 03/06/20: 46/56), 12/14: 32/56, 1/13: 31/56, 01/16/21 35/56, 02/06/2021=36/56. 02/18/2021= 37/56.03/20/2021- Deferred secondary to left knee aching- will attempt next visit. 05/06/2021= 39/56; 05/13/2021= 36/56 *patient having more left knee pain.    Time 12    Period Weeks    Status Achieved      PT LONG TERM GOAL #2   Title Pt will decrease TUG to below 14 seconds/decrease in order to demonstrate decreased fall risk.    Baseline 09/20/20: 19.2s, 12/14: 17.48, 1/13: 16.65 sec. 01/02/2021=15.75 sec using 4WW, 01/21/21  14.37 seconds. 02/06/2021= 14.92 sec using 4WW. 02/18/2021= 14.5 sec using 4WW. 03/20/2021- Deferred secondary to left knee aching- will attempt next visit. 05/06/2021= 16.32 sec avg using 4WW. 05/13/2021= 16.5 sec with 4WW *increased left knee pain.    Time 12    Period Weeks    Status On-going    Target Date 08/05/21      PT LONG TERM GOAL #3   Title Pt will decrease 5TSTS by at least 3 seconds in order to demonstrate clinically significant improvement in LE strength.    Baseline 09/20/20: 23.1s with  minA+1 for strength and balance (Previous discharge 03/06/20: 16.1s), 12/14: 20 sec, 1/13: 22 sec.   01/02/2021= 16.05 sec with light UE support. 01/21/21 01/21/21 37.90 seconds without UE, with UE support 12.93 seconds: 02/06/2021= with 1 UE 17.93 sec. 02/18/2021= 21.33 sec without UE support. 05/06/2021= 15.3 sec with BUE Support and 21. 0 sec without UE support. 05/13/2021=15.0 sec with BUE support; 16 sec without UE support.    Time 12    Period Weeks    Status Achieved      PT LONG TERM GOAL #4   Title Pt will increase his self-selected 10MWT to >0.74 m/s in order to demonstrate improvement in gait speed and improved community ambulation    Baseline 09/20/20: self-selected: 14.4s = 0.69 m/s with rollator (Previous discharge 03/06/20: self-selected: 13.6s = 0.74 m/s), 12/14: 0.75 m/s, 1/13: 0.78 m/s. 01/02/2021= 0.85 m/s using 4WW, 01/16/21 with 4 WW .99 m/s. 02/06/2021= self selected = 0.93 m/s with 2BJ    Time 4    Period Weeks    Status Achieved      PT LONG TERM GOAL #5   Title Patient will increase six minute walk test distance to >1000 for progression to community ambulator and improve gait ability    Baseline 01/10/20  620 ft with rollator, 01/21/21  965 ft; 02/06/2021= 910 feet with 4WW.  02/18/2021= 950 feet with 4WW. 05/06/2021= will test next visit. 05/13/2021- Deferred secondary to increased left knee pain. Will keep goal active as patient was making significant progress.    Time 12    Period Weeks    Status On-going    Target Date 08/05/21      Additional Long Term Goals   Additional Long Term Goals Yes      PT LONG TERM GOAL #6   Title Patient will demonstrate ability to walk with HHA +1 > 200 ft. without loss of balance on level surfaces to simulate walking  his grandaughter down the aisle for her upcoming wedding in November 2022.    Baseline 05/13/2021- Patient currently ambulating short community distance with use of 4WW but desires to walk with his grandaughter with HHA for her wedding.     Time 12    Period Weeks    Status New    Target Date 08/05/21      PT LONG TERM GOAL #7   Title Pt will decrease 5TSTS  to 14 sec or less with BUE support on armrest in order to demonstrate clinically significant improvement in LE strength.    Baseline 05/13/2021=5.0 sec with BUE support; 16 sec without UE support.    Time 12    Period Weeks    Status New    Target Date 08/05/21                   Plan - 05/15/21 6314     Clinical Impression Statement Patient performed well today with improving foot clearance but requires increased VC to perform correctly. He was able to perform heel to toe correctly during exercises and this translated well into gait after with and without ankle weight- although much improved without weight). Pt will benefit from further skilled PT to improve BLE strength, balance and functional mobility    Personal Factors and Comorbidities Age;Comorbidity 3+    Comorbidities HTN, CVA, cholecystitis with cholecystostomy drain    Examination-Activity Limitations Bend;Lift;Squat;Locomotion Level;Stairs;Stand    Examination-Participation Restrictions Community Activity;Driving;Laundry;Meal Prep;Shop    Stability/Clinical Decision Making Unstable/Unpredictable    Rehab Potential Fair    PT Frequency 2x / week    PT Duration 12 weeks    PT Treatment/Interventions ADLs/Self Care Home Management;Cryotherapy;Electrical Stimulation;Iontophoresis 4mg /ml Dexamethasone;Moist Heat;Traction;Ultrasound;DME Instruction;Gait training;Stair training;Therapeutic activities;Therapeutic exercise;Functional mobility training;Balance training;Neuromuscular re-education;Patient/family education;Manual techniques;Dry needling;Vestibular;Joint Manipulations;Spinal Manipulations;Aquatic Therapy    PT Next Visit Plan Continue to focus on LE strengthening and balance, dynamic balance tasks    PT Home Exercise Plan no changes    Consulted and Agree with Plan of Care Patient;Family  member/caregiver             Patient will benefit from skilled therapeutic intervention in order to improve the following deficits and impairments:  Abnormal gait, Decreased balance, Decreased mobility, Difficulty walking, Decreased knowledge of precautions, Decreased safety awareness, Decreased activity tolerance, Decreased strength, Decreased endurance  Visit Diagnosis: Abnormality of gait and mobility  Difficulty in walking, not elsewhere classified  Muscle weakness (generalized)  Other lack of coordination     Problem List Patient Active Problem List   Diagnosis Date Noted   Moderate protein-calorie malnutrition ([place]) 03/11/2021   Hemiparesis affecting left side as late effect of cerebrovascular accident (CVA) ([place]) 03/11/2021   Aneurysm, aorta, thoracic ([place]) 12/21/2020   Calculus of bile duct without cholecystitis and without obstruction    Non-small cell carcinoma of lung, right ([place]) 09/07/2020   Mass of upper lobe of right lung 07/13/2020   Calculus of bile duct with cholecystitis without obstruction    Abnormal findings on imaging of biliary tract    Acute cholecystitis 05/02/2020   Chronic venous insufficiency 12/05/2019   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    CKD (chronic kidney disease), stage II    New onset atrial fibrillation ([place])    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   Goals of care, counseling/discussion    Palliative care by specialist    DNR (do not resuscitate) discussion    History of CVA (cerebrovascular accident)  05/22/2019   Persistent proteinuria 07/30/2018   Mobitz type 2 second degree heart block 07/13/2018   Chronic kidney disease, stage III (moderate) ([place]) 06/15/2018   Leg pain 05/06/2018   Lymphedema 05/06/2018   Coagulopathy ([place]) 07/07/2017   Lumbar spondylosis 03/10/2017   Elevated uric acid in blood 11/20/2016   BPH (benign prostatic hyperplasia) 05/29/2016   Peripheral vascular disease of lower  extremity ([place]) 03/03/2016   Gallstone 12/31/2015   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   Atherosclerosis of aorta ([place]) 11/22/2015   Carotid artery narrowing 11/22/2015   Diverticulosis of colon 11/22/2015   Decreased creatinine clearance 11/22/2015   Arthritis, degenerative 11/22/2015   Lactose intolerance 11/22/2015   Basal cell carcinoma 11/22/2015   Essential hypertension 06/21/2015   Hyperlipemia 06/21/2015   GERD (gastroesophageal reflux disease) 06/21/2015   Calculus of kidney 11/18/2013    Lewis Moccasin, PT 05/16/2021, 10:51 AM  [place] MAIN [facility] SERVICES [address] [place], [place], [address] Phone: [phone]   Fax:  [phone]  Name: Jose Castro MRN: [id number] Date of Birth: 06/21/1926

## 2021-05-21 ENCOUNTER — Ambulatory Visit: Payer: Medicare Other

## 2021-05-21 ENCOUNTER — Other Ambulatory Visit: Payer: Self-pay

## 2021-05-21 DIAGNOSIS — R269 Unspecified abnormalities of gait and mobility: Secondary | ICD-10-CM | POA: Diagnosis not present

## 2021-05-21 DIAGNOSIS — M6281 Muscle weakness (generalized): Secondary | ICD-10-CM

## 2021-05-21 DIAGNOSIS — R262 Difficulty in walking, not elsewhere classified: Secondary | ICD-10-CM

## 2021-05-21 DIAGNOSIS — R278 Other lack of coordination: Secondary | ICD-10-CM

## 2021-05-21 NOTE — Therapy (Signed)
[place] MAIN [facility] SERVICES [address] [place], [place], [address] Phone: [phone]   Fax:  [phone]  Physical Therapy Treatment  Patient Details  Name: Jose Castro MRN: [id number] Date of Birth: 1926-10-24 Referring Provider (PT): Dr. Ancil Boozer   Encounter Date: 05/21/2021   PT End of Session - 05/21/21 0809     Visit Number 34    Number of Visits 68    Date for PT Re-Evaluation 05/13/21    Authorization Type eval: 09/20/20, Progress note 01/02/2021, PN on 12/08/4079, Recert = 4/48/1856-02/11/9701; PN on 4/202022; PN on 05/03/7857; Recert= 8/50/2774    PT Start Time 1300    PT Stop Time 1344    PT Time Calculation (min) 44 min    Equipment Utilized During Treatment Gait belt    Activity Tolerance Patient tolerated treatment well    Behavior During Therapy WFL for tasks assessed/performed             Past Medical History:  Diagnosis Date   Allergy    Penicillin   Arthritis    Basal cell carcinoma    BPH (benign prostatic hyperplasia)    Chronic kidney disease    had a kidney stone which per family was a cyst that was removed   Dysrhythmia    GERD (gastroesophageal reflux disease)    History of kidney stones    Hyperlipidemia    Hypertension    Left-sided low back pain with left-sided sciatica    Lung mass    Splenic vein thrombosis    Stroke ([place]) 05/22/2019   Thrombosis 12/2015   mural  area and no notation of heart attack    Past Surgical History:  Procedure Laterality Date   APPENDECTOMY     CATARACT EXTRACTION, BILATERAL     CYSTOSCOPY WITH INSERTION OF UROLIFT     ENDOSCOPIC RETROGRADE CHOLANGIOPANCREATOGRAPHY (ERCP) WITH PROPOFOL N/A 05/11/2020   Procedure: ENDOSCOPIC RETROGRADE CHOLANGIOPANCREATOGRAPHY (ERCP) WITH PROPOFOL;  Surgeon: [name], MD;  Location: [facility] ENDOSCOPY;  Service: Endoscopy;  Laterality: N/A;   ERCP N/A 10/23/2020   Procedure: ENDOSCOPIC RETROGRADE CHOLANGIOPANCREATOGRAPHY (ERCP);   Surgeon: [name], MD;  Location: [facility] ENDOSCOPY;  Service: Endoscopy;  Laterality: N/A;   EYE SURGERY  08/2018   IR EXCHANGE BILIARY DRAIN  05/04/2020   KIDNEY STONE SURGERY     KNEE SURGERY     PACEMAKER INSERTION N/A 07/13/2018   Procedure: INSERTION PACEMAKER-DUEL CHAMBER INITIAL IMPLANT;  Surgeon: Isaias Cowman, MD;  Location: [facility] ORS;  Service: Cardiovascular;  Laterality: N/A;   SPINE SURGERY      There were no vitals filed for this visit.   Subjective Assessment - 05/21/21 1303     Subjective Patient reports that he is feeling pretty good today as he had a therapeutic massge yesterday evening.    Patient is accompained by: Family member    Pertinent History Pt referred for deconditioning and gait instability. He has had a decline since he last finished therapy. Pt had a hospital admission from 05/02/20 to 05/14/20 for acute cholecystitis and is now s/p cholecystostomy drain placement. Drain is still in place and pt is waiting to hear when they plan to remove it. Pt discharged to SNF and then returned home. In addition pt underwent radiation therapy for a RUL lung mass/cancer and had his last radiation treatment in September of 2021. He reports that he has continued to walk at home as his primary exercise. Pt was previously seen at this clinic for  PT/OT due to weakness/imbalance s/p CVA. Prior history from 09/21/19: Pt is a 87 year old male who presents with imbalance and difficulty with gait following a right frontal CVA with left hemiparesis on 05/22/19.  He has completed inpatient and home health PT, and now presents for OP PT.  He was discharged from inpatient rehab at a supervision level of assistance, ambulating with a RW.  His family notes that he is impulsive and will try to ambulate without his RW at home.  Family is currently providing 24/7 supervision.    Limitations House hold activities;Walking    How long can you sit comfortably? no limitations    How long can you walk  comfortably? he requires a RW    Patient Stated Goals Pt wants to be able to walk his granddaughter down the aisle at her wedding November 2022    Currently in Pain? Yes    Pain Score 3     Pain Location Knee    Pain Orientation Left    Pain Descriptors / Indicators Aching    Pain Type Chronic pain    Pain Onset More than a month ago    Pain Frequency Constant    Aggravating Factors  Weight bearing    Pain Relieving Factors Rest    Effect of Pain on Daily Activities Decreased standing time and walking               Interventions:   Nustep L2 LE only x 5 min for warm-   Manual stretching to lower extremities:  B hamstring Lower trunk rotation Single knee to chest Piriformis All x 30 sec B x 3 sets each LE. *patient presents with increased stiffness throughout Left LE  SLR- B LE 2 sets of 10 reps- VC to straighten left LE as much as possible  Theraball- bridging - CGA to keep LE on ball for 1set of 10 - he did improve with 2nd set and able to perform on his own.  Ham curl on theraball in hooklye position- 2 sets of 10 reps Lower trunk rotation Thoracic rotation (sidelye position) - 15 reps each direction- increased VC and visual demo for correct technique.  Sidelye hip abd B LE x 10 reps x 2 sets each-VC to keep leg straight and lift as high as possible.   Standing wall posture stretch x  67min x 2 sets - VC/Visual demo for head position. Utilized pillow to facilitate chin tuck.   Standing at wall- Lift PVC overhead for posture- 2 sets of 10 reps. Patient able to lift arms overhead but unable to achieve Education provided throughout session via VC/TC and demonstration to facilitate movement at target joints and correct muscle activation for all testing and exercises performed.   Clinical Impression: Patient performed well overall- able to follow cues for all LE strengthening and postural activities. He presents with pain limiting left knee with weightbearing so benefited  from non-weight bearing strengthening exercises without report of increased pain today. Pt will benefit from further skilled PT to improve BLE strength, balance and functional mobility.                          PT Short Term Goals - 01/21/21 1438       PT SHORT TERM GOAL #1   Title Pt will be independent with HEP in order to improve strength and balance in order to decrease fall risk and improve function at home.    Baseline 01/02/2021-  Patient verbalize understanding of walking and seated exercises for home program. 01/21/21 patient has been more active and understands HEP.    Time 6    Period Weeks    Status Achieved    Target Date 01/10/21               PT Long Term Goals - 05/13/21 1426       PT LONG TERM GOAL #1   Title Pt will improve BERG by at least 3 points in order to demonstrate clinically significant improvement in balance.    Baseline 09/20/20: 32/56 (Previous discharge: 03/06/20: 46/56), 12/14: 32/56, 1/13: 31/56, 01/16/21 35/56, 02/06/2021=36/56. 02/18/2021= 37/56.03/20/2021- Deferred secondary to left knee aching- will attempt next visit. 05/06/2021= 39/56; 05/13/2021= 36/56 *patient having more left knee pain.    Time 12    Period Weeks    Status Achieved      PT LONG TERM GOAL #2   Title Pt will decrease TUG to below 14 seconds/decrease in order to demonstrate decreased fall risk.    Baseline 09/20/20: 19.2s, 12/14: 17.48, 1/13: 16.65 sec. 01/02/2021=15.75 sec using 4WW, 01/21/21  14.37 seconds. 02/06/2021= 14.92 sec using 4WW. 02/18/2021= 14.5 sec using 4WW. 03/20/2021- Deferred secondary to left knee aching- will attempt next visit. 05/06/2021= 16.32 sec avg using 4WW. 05/13/2021= 16.5 sec with 4WW *increased left knee pain.    Time 12    Period Weeks    Status On-going    Target Date 08/05/21      PT LONG TERM GOAL #3   Title Pt will decrease 5TSTS by at least 3 seconds in order to demonstrate clinically significant improvement in LE strength.     Baseline 09/20/20: 23.1s with minA+1 for strength and balance (Previous discharge 03/06/20: 16.1s), 12/14: 20 sec, 1/13: 22 sec.   01/02/2021= 16.05 sec with light UE support. 01/21/21 01/21/21 37.90 seconds without UE, with UE support 12.93 seconds: 02/06/2021= with 1 UE 17.93 sec. 02/18/2021= 21.33 sec without UE support. 05/06/2021= 15.3 sec with BUE Support and 21. 0 sec without UE support. 05/13/2021=15.0 sec with BUE support; 16 sec without UE support.    Time 12    Period Weeks    Status Achieved      PT LONG TERM GOAL #4   Title Pt will increase his self-selected 10MWT to >0.74 m/s in order to demonstrate improvement in gait speed and improved community ambulation    Baseline 09/20/20: self-selected: 14.4s = 0.69 m/s with rollator (Previous discharge 03/06/20: self-selected: 13.6s = 0.74 m/s), 12/14: 0.75 m/s, 1/13: 0.78 m/s. 01/02/2021= 0.85 m/s using 4WW, 01/16/21 with 4 WW .99 m/s. 02/06/2021= self selected = 0.93 m/s with 5KN    Time 4    Period Weeks    Status Achieved      PT LONG TERM GOAL #5   Title Patient will increase six minute walk test distance to >1000 for progression to community ambulator and improve gait ability    Baseline 01/10/20  620 ft with rollator, 01/21/21  965 ft; 02/06/2021= 910 feet with 4WW.  02/18/2021= 950 feet with 4WW. 05/06/2021= will test next visit. 05/13/2021- Deferred secondary to increased left knee pain. Will keep goal active as patient was making significant progress.    Time 12    Period Weeks    Status On-going    Target Date 08/05/21      Additional Long Term Goals   Additional Long Term Goals Yes      PT LONG TERM GOAL #6  Title Patient will demonstrate ability to walk with HHA +1 > 200 ft. without loss of balance on level surfaces to simulate walking his grandaughter down the aisle for her upcoming wedding in November 2022.    Baseline 05/13/2021- Patient currently ambulating short community distance with use of 4WW but desires to walk with his grandaughter  with HHA for her wedding.    Time 12    Period Weeks    Status New    Target Date 08/05/21      PT LONG TERM GOAL #7   Title Pt will decrease 5TSTS  to 14 sec or less with BUE support on armrest in order to demonstrate clinically significant improvement in LE strength.    Baseline 05/13/2021=5.0 sec with BUE support; 16 sec without UE support.    Time 12    Period Weeks    Status New    Target Date 08/05/21                   Plan - 05/21/21 0809     Clinical Impression Statement Patient performed well overall- able to follow cues for all LE strengthening and postural activities. He presents with pain limiting left knee with weightbearing so benefited from non-weight bearing strengthening exercises without report of increased pain today. Pt will benefit from further skilled PT to improve BLE strength, balance and functional mobility.    Personal Factors and Comorbidities Age;Comorbidity 3+    Comorbidities HTN, CVA, cholecystitis with cholecystostomy drain    Examination-Activity Limitations Bend;Lift;Squat;Locomotion Level;Stairs;Stand    Examination-Participation Restrictions Community Activity;Driving;Laundry;Meal Prep;Shop    Stability/Clinical Decision Making Unstable/Unpredictable    Rehab Potential Fair    PT Frequency 2x / week    PT Duration 12 weeks    PT Treatment/Interventions ADLs/Self Care Home Management;Cryotherapy;Electrical Stimulation;Iontophoresis 4mg /ml Dexamethasone;Moist Heat;Traction;Ultrasound;DME Instruction;Gait training;Stair training;Therapeutic activities;Therapeutic exercise;Functional mobility training;Balance training;Neuromuscular re-education;Patient/family education;Manual techniques;Dry needling;Vestibular;Joint Manipulations;Spinal Manipulations;Aquatic Therapy    PT Next Visit Plan Continue to focus on LE strengthening and balance, dynamic balance tasks    PT Home Exercise Plan no changes    Consulted and Agree with Plan of Care  Patient;Family member/caregiver             Patient will benefit from skilled therapeutic intervention in order to improve the following deficits and impairments:  Abnormal gait, Decreased balance, Decreased mobility, Difficulty walking, Decreased knowledge of precautions, Decreased safety awareness, Decreased activity tolerance, Decreased strength, Decreased endurance  Visit Diagnosis: Abnormality of gait and mobility  Difficulty in walking, not elsewhere classified  Muscle weakness (generalized)  Other lack of coordination     Problem List Patient Active Problem List   Diagnosis Date Noted   Moderate protein-calorie malnutrition ([place]) 03/11/2021   Hemiparesis affecting left side as late effect of cerebrovascular accident (CVA) ([place]) 03/11/2021   Aneurysm, aorta, thoracic ([place]) 12/21/2020   Calculus of bile duct without cholecystitis and without obstruction    Non-small cell carcinoma of lung, right ([place]) 09/07/2020   Mass of upper lobe of right lung 07/13/2020   Calculus of bile duct with cholecystitis without obstruction    Abnormal findings on imaging of biliary tract    Acute cholecystitis 05/02/2020   Chronic venous insufficiency 12/05/2019   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    CKD (chronic kidney disease), stage II    New onset atrial fibrillation ([place])    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   Goals of care, counseling/discussion    Palliative care by  specialist    DNR (do not resuscitate) discussion    History of CVA (cerebrovascular accident) 05/22/2019   Persistent proteinuria 07/30/2018   Mobitz type 2 second degree heart block 07/13/2018   Chronic kidney disease, stage III (moderate) ([place]) 06/15/2018   Leg pain 05/06/2018   Lymphedema 05/06/2018   Coagulopathy ([place]) 07/07/2017   Lumbar spondylosis 03/10/2017   Elevated uric acid in blood 11/20/2016   BPH (benign prostatic hyperplasia) 05/29/2016   Peripheral vascular disease  of lower extremity ([place]) 03/03/2016   Gallstone 12/31/2015   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   Atherosclerosis of aorta ([place]) 11/22/2015   Carotid artery narrowing 11/22/2015   Diverticulosis of colon 11/22/2015   Decreased creatinine clearance 11/22/2015   Arthritis, degenerative 11/22/2015   Lactose intolerance 11/22/2015   Basal cell carcinoma 11/22/2015   Essential hypertension 06/21/2015   Hyperlipemia 06/21/2015   GERD (gastroesophageal reflux disease) 06/21/2015   Calculus of kidney 11/18/2013    Lewis Moccasin, PT 05/22/2021, 8:31 AM  [place] MAIN [facility] SERVICES [address] [place], [place], [address] Phone: [phone]   Fax:  [phone]  Name: Jose Castro MRN: [id number] Date of Birth: [birth year]

## 2021-05-23 ENCOUNTER — Ambulatory Visit: Payer: Medicare Other

## 2021-05-23 ENCOUNTER — Other Ambulatory Visit: Payer: Self-pay

## 2021-05-23 DIAGNOSIS — R278 Other lack of coordination: Secondary | ICD-10-CM

## 2021-05-23 DIAGNOSIS — M6281 Muscle weakness (generalized): Secondary | ICD-10-CM

## 2021-05-23 DIAGNOSIS — R262 Difficulty in walking, not elsewhere classified: Secondary | ICD-10-CM

## 2021-05-23 DIAGNOSIS — R269 Unspecified abnormalities of gait and mobility: Secondary | ICD-10-CM | POA: Diagnosis not present

## 2021-05-23 NOTE — Therapy (Signed)
[place] MAIN [facility] SERVICES [address] [place], [place], [address] Phone: [phone]   Fax:  [phone]  Physical Therapy Treatment  Patient Details  Name: Jose Castro MRN: [id number] Date of Birth: 24-Aug-1926 Referring Provider (PT): Dr. Ancil Boozer   Encounter Date: 05/23/2021   PT End of Session - 05/27/21 0902     Visit Number 35    Number of Visits 37    Date for PT Re-Evaluation 08/05/21    Authorization Type eval: 09/20/20, Progress note 01/02/2021, PN on 03/04/101, Recert = 06/25/3663-03/03/4741; PN on 4/202022; PN on 04/08/5637; Recert= 7/56/43329-03/31/8840    PT Start Time 1302    PT Stop Time 1344    PT Time Calculation (min) 42 min    Equipment Utilized During Treatment Gait belt    Activity Tolerance Patient tolerated treatment well    Behavior During Therapy WFL for tasks assessed/performed              Past Medical History:  Diagnosis Date   Allergy    Penicillin   Arthritis    Basal cell carcinoma    BPH (benign prostatic hyperplasia)    Chronic kidney disease    had a kidney stone which per family was a cyst that was removed   Dysrhythmia    GERD (gastroesophageal reflux disease)    History of kidney stones    Hyperlipidemia    Hypertension    Left-sided low back pain with left-sided sciatica    Lung mass    Splenic vein thrombosis    Stroke ([place]) 05/22/2019   Thrombosis 12/2015   mural  area and no notation of heart attack    Past Surgical History:  Procedure Laterality Date   APPENDECTOMY     CATARACT EXTRACTION, BILATERAL     CYSTOSCOPY WITH INSERTION OF UROLIFT     ENDOSCOPIC RETROGRADE CHOLANGIOPANCREATOGRAPHY (ERCP) WITH PROPOFOL N/A 05/11/2020   Procedure: ENDOSCOPIC RETROGRADE CHOLANGIOPANCREATOGRAPHY (ERCP) WITH PROPOFOL;  Surgeon: [name], MD;  Location: [facility] ENDOSCOPY;  Service: Endoscopy;  Laterality: N/A;   ERCP N/A 10/23/2020   Procedure: ENDOSCOPIC RETROGRADE CHOLANGIOPANCREATOGRAPHY  (ERCP);  Surgeon: [name], MD;  Location: [facility] ENDOSCOPY;  Service: Endoscopy;  Laterality: N/A;   EYE SURGERY  08/2018   IR EXCHANGE BILIARY DRAIN  05/04/2020   KIDNEY STONE SURGERY     KNEE SURGERY     PACEMAKER INSERTION N/A 07/13/2018   Procedure: INSERTION PACEMAKER-DUEL CHAMBER INITIAL IMPLANT;  Surgeon: Isaias Cowman, MD;  Location: [facility] ORS;  Service: Cardiovascular;  Laterality: N/A;   SPINE SURGERY      There were no vitals filed for this visit.   Subjective Assessment - 05/27/21 0900     Subjective Patient reports he is feeling about the same today.    Patient is accompained by: Family member    Pertinent History Pt referred for deconditioning and gait instability. He has had a decline since he last finished therapy. Pt had a hospital admission from 05/02/20 to 05/14/20 for acute cholecystitis and is now s/p cholecystostomy drain placement. Drain is still in place and pt is waiting to hear when they plan to remove it. Pt discharged to SNF and then returned home. In addition pt underwent radiation therapy for a RUL lung mass/cancer and had his last radiation treatment in September of 2021. He reports that he has continued to walk at home as his primary exercise. Pt was previously seen at this clinic for PT/OT due to weakness/imbalance s/p CVA. Prior  history from 09/21/19: Pt is a 87 year old male who presents with imbalance and difficulty with gait following a right frontal CVA with left hemiparesis on 05/22/19.  He has completed inpatient and home health PT, and now presents for OP PT.  He was discharged from inpatient rehab at a supervision level of assistance, ambulating with a RW.  His family notes that he is impulsive and will try to ambulate without his RW at home.  Family is currently providing 24/7 supervision.    Limitations House hold activities;Walking    How long can you sit comfortably? no limitations    How long can you walk comfortably? he requires a RW    Patient  Stated Goals Pt wants to be able to walk his granddaughter down the aisle at her wedding November 2022    Currently in Pain? Yes    Pain Score 2     Pain Location Knee    Pain Orientation Left    Pain Descriptors / Indicators Aching    Pain Type Chronic pain    Pain Onset More than a month ago    Pain Frequency Constant    Aggravating Factors  Bending, walking    Pain Relieving Factors Rest    Effect of Pain on Daily Activities Decreased steanding time and walking               Interventions:   Nustep L2 LE only x 5 min for warm-   Manual stretching to lower extremities:  B hamstring Lower trunk rotation Single knee to chest Piriformis All x 30 sec B x 3 sets each LE. *patient presents with increased stiffness throughout Left LE  SLR- B LE 2 sets of 10 reps- VC to straighten left LE as much as possible  bridging -  2 sets of 10 reps- Min VC to increased height of bridge.  Heel slides  BLE - 2 sets of 10 reps Lower trunk rotation x 10 reps x 2 sets  *patient reported increased low back discomfort requesting to sit up.   Seated ham curl- GTB 2 sets of 12 reps Seated Scap retraction with GTB x 12 reps x 2 sets Seated horizontal Shoulder abd x 12 reps x 2 sets   Standing wall posture stretch x  65min x 2 sets - VC/Visual demo for head position. Utilized pillow to facilitate chin tuck.   Standing at wall- Lift PVC overhead for posture- 2 sets of 10 reps. Patient able to lift arms overhead but unable to achieve Education provided throughout session via VC/TC and demonstration to facilitate movement at target joints and correct muscle activation for all testing and exercises performed.   Clinical Impression: Patient was limited with supine stating his back was bothering him with laying flat today. He was able to improve overall with less cues for bridging and  Pt will benefit from further skilled PT to improve BLE strength, balance and functional  mobility.                         PT Education - 05/27/21 0901     Education Details exercise technique    Person(s) Educated Patient    Methods Explanation;Demonstration;Tactile cues;Verbal cues    Comprehension Verbalized understanding;Returned demonstration;Verbal cues required;Need further instruction;Tactile cues required               PT Short Term Goals - 01/21/21 1438       PT SHORT TERM GOAL #1  Title Pt will be independent with HEP in order to improve strength and balance in order to decrease fall risk and improve function at home.    Baseline 01/02/2021- Patient verbalize understanding of walking and seated exercises for home program. 01/21/21 patient has been more active and understands HEP.    Time 6    Period Weeks    Status Achieved    Target Date 01/10/21               PT Long Term Goals - 05/13/21 1426       PT LONG TERM GOAL #1   Title Pt will improve BERG by at least 3 points in order to demonstrate clinically significant improvement in balance.    Baseline 09/20/20: 32/56 (Previous discharge: 03/06/20: 46/56), 12/14: 32/56, 1/13: 31/56, 01/16/21 35/56, 02/06/2021=36/56. 02/18/2021= 37/56.03/20/2021- Deferred secondary to left knee aching- will attempt next visit. 05/06/2021= 39/56; 05/13/2021= 36/56 *patient having more left knee pain.    Time 12    Period Weeks    Status Achieved      PT LONG TERM GOAL #2   Title Pt will decrease TUG to below 14 seconds/decrease in order to demonstrate decreased fall risk.    Baseline 09/20/20: 19.2s, 12/14: 17.48, 1/13: 16.65 sec. 01/02/2021=15.75 sec using 4WW, 01/21/21  14.37 seconds. 02/06/2021= 14.92 sec using 4WW. 02/18/2021= 14.5 sec using 4WW. 03/20/2021- Deferred secondary to left knee aching- will attempt next visit. 05/06/2021= 16.32 sec avg using 4WW. 05/13/2021= 16.5 sec with 4WW *increased left knee pain.    Time 12    Period Weeks    Status On-going    Target Date 08/05/21      PT LONG TERM  GOAL #3   Title Pt will decrease 5TSTS by at least 3 seconds in order to demonstrate clinically significant improvement in LE strength.    Baseline 09/20/20: 23.1s with minA+1 for strength and balance (Previous discharge 03/06/20: 16.1s), 12/14: 20 sec, 1/13: 22 sec.   01/02/2021= 16.05 sec with light UE support. 01/21/21 01/21/21 37.90 seconds without UE, with UE support 12.93 seconds: 02/06/2021= with 1 UE 17.93 sec. 02/18/2021= 21.33 sec without UE support. 05/06/2021= 15.3 sec with BUE Support and 21. 0 sec without UE support. 05/13/2021=15.0 sec with BUE support; 16 sec without UE support.    Time 12    Period Weeks    Status Achieved      PT LONG TERM GOAL #4   Title Pt will increase his self-selected 10MWT to >0.74 m/s in order to demonstrate improvement in gait speed and improved community ambulation    Baseline 09/20/20: self-selected: 14.4s = 0.69 m/s with rollator (Previous discharge 03/06/20: self-selected: 13.6s = 0.74 m/s), 12/14: 0.75 m/s, 1/13: 0.78 m/s. 01/02/2021= 0.85 m/s using 4WW, 01/16/21 with 4 WW .99 m/s. 02/06/2021= self selected = 0.93 m/s with 9SW    Time 4    Period Weeks    Status Achieved      PT LONG TERM GOAL #5   Title Patient will increase six minute walk test distance to >1000 for progression to community ambulator and improve gait ability    Baseline 01/10/20  620 ft with rollator, 01/21/21  965 ft; 02/06/2021= 910 feet with 4WW.  02/18/2021= 950 feet with 4WW. 05/06/2021= will test next visit. 05/13/2021- Deferred secondary to increased left knee pain. Will keep goal active as patient was making significant progress.    Time 12    Period Weeks    Status On-going    Target  Date 08/05/21      Additional Long Term Goals   Additional Long Term Goals Yes      PT LONG TERM GOAL #6   Title Patient will demonstrate ability to walk with HHA +1 > 200 ft. without loss of balance on level surfaces to simulate walking his grandaughter down the aisle for her upcoming wedding in November  2022.    Baseline 05/13/2021- Patient currently ambulating short community distance with use of 4WW but desires to walk with his grandaughter with HHA for her wedding.    Time 12    Period Weeks    Status New    Target Date 08/05/21      PT LONG TERM GOAL #7   Title Pt will decrease 5TSTS  to 14 sec or less with BUE support on armrest in order to demonstrate clinically significant improvement in LE strength.    Baseline 05/13/2021=5.0 sec with BUE support; 16 sec without UE support.    Time 12    Period Weeks    Status New    Target Date 08/05/21                   Plan - 05/27/21 0902     Clinical Impression Statement Patient was limited with supine stating his back was bothering him with laying flat today. He was able to improve overall with less cues for bridging and  Pt will benefit from further skilled PT to improve BLE strength, balance and functional mobility.    Personal Factors and Comorbidities Age;Comorbidity 3+    Comorbidities HTN, CVA, cholecystitis with cholecystostomy drain    Examination-Activity Limitations Bend;Lift;Squat;Locomotion Level;Stairs;Stand    Examination-Participation Restrictions Community Activity;Driving;Laundry;Meal Prep;Shop    Stability/Clinical Decision Making Unstable/Unpredictable    Rehab Potential Fair    PT Frequency 2x / week    PT Duration 12 weeks    PT Treatment/Interventions ADLs/Self Care Home Management;Cryotherapy;Electrical Stimulation;Iontophoresis 4mg /ml Dexamethasone;Moist Heat;Traction;Ultrasound;DME Instruction;Gait training;Stair training;Therapeutic activities;Therapeutic exercise;Functional mobility training;Balance training;Neuromuscular re-education;Patient/family education;Manual techniques;Dry needling;Vestibular;Joint Manipulations;Spinal Manipulations;Aquatic Therapy    PT Next Visit Plan Continue to focus on LE strengthening and balance, dynamic balance tasks    PT Home Exercise Plan no changes    Consulted and  Agree with Plan of Care Patient;Family member/caregiver              Patient will benefit from skilled therapeutic intervention in order to improve the following deficits and impairments:  Abnormal gait, Decreased balance, Decreased mobility, Difficulty walking, Decreased knowledge of precautions, Decreased safety awareness, Decreased activity tolerance, Decreased strength, Decreased endurance  Visit Diagnosis: Abnormality of gait and mobility  Difficulty in walking, not elsewhere classified  Muscle weakness (generalized)  Other lack of coordination     Problem List Patient Active Problem List   Diagnosis Date Noted   Moderate protein-calorie malnutrition ([place]) 03/11/2021   Hemiparesis affecting left side as late effect of cerebrovascular accident (CVA) ([place]) 03/11/2021   Aneurysm, aorta, thoracic ([place]) 12/21/2020   Calculus of bile duct without cholecystitis and without obstruction    Non-small cell carcinoma of lung, right ([place]) 09/07/2020   Mass of upper lobe of right lung 07/13/2020   Calculus of bile duct with cholecystitis without obstruction    Abnormal findings on imaging of biliary tract    Acute cholecystitis 05/02/2020   Chronic venous insufficiency 12/05/2019   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    CKD (chronic kidney disease), stage II    New onset atrial fibrillation ([place])  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   Goals of care, counseling/discussion    Palliative care by specialist    DNR (do not resuscitate) discussion    History of CVA (cerebrovascular accident) 05/22/2019   Persistent proteinuria 07/30/2018   Mobitz type 2 second degree heart block 07/13/2018   Chronic kidney disease, stage III (moderate) ([place]) 06/15/2018   Leg pain 05/06/2018   Lymphedema 05/06/2018   Coagulopathy ([place]) 07/07/2017   Lumbar spondylosis 03/10/2017   Elevated uric acid in blood 11/20/2016   BPH (benign prostatic hyperplasia) 05/29/2016    Peripheral vascular disease of lower extremity ([place]) 03/03/2016   Gallstone 12/31/2015   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   Atherosclerosis of aorta ([place]) 11/22/2015   Carotid artery narrowing 11/22/2015   Diverticulosis of colon 11/22/2015   Decreased creatinine clearance 11/22/2015   Arthritis, degenerative 11/22/2015   Lactose intolerance 11/22/2015   Basal cell carcinoma 11/22/2015   Essential hypertension 06/21/2015   Hyperlipemia 06/21/2015   GERD (gastroesophageal reflux disease) 06/21/2015   Calculus of kidney 11/18/2013    Lewis Moccasin, PT 05/27/2021, 9:10 AM  [place] MAIN [facility] SERVICES [address] [place], [place], [address] Phone: [phone]   Fax:  [phone]  Name: Jose Castro MRN: [id number] Date of Birth: March 08, 1926

## 2021-05-28 ENCOUNTER — Ambulatory Visit: Payer: Medicare Other

## 2021-05-28 ENCOUNTER — Other Ambulatory Visit: Payer: Self-pay

## 2021-05-28 DIAGNOSIS — R278 Other lack of coordination: Secondary | ICD-10-CM

## 2021-05-28 DIAGNOSIS — R262 Difficulty in walking, not elsewhere classified: Secondary | ICD-10-CM

## 2021-05-28 DIAGNOSIS — R269 Unspecified abnormalities of gait and mobility: Secondary | ICD-10-CM | POA: Diagnosis not present

## 2021-05-28 NOTE — Therapy (Signed)
[place] MAIN [facility] SERVICES [address] [place], [place], [address] Phone: [phone]   Fax:  [phone]  Physical Therapy Treatment  Patient Details  Name: Jose Castro MRN: [id number] Date of Birth: 08-14-1926 Referring Provider (PT): Dr. Ancil Boozer   Encounter Date: 05/28/2021   PT End of Session - 05/28/21 1310     Visit Number 36    Number of Visits 44    Date for PT Re-Evaluation 08/05/21    Authorization Type eval: 09/20/20, Progress note 01/02/2021, PN on 05/06/629, Recert = 1/60/1093-2/35/5732; PN on 4/202022; PN on 2/0/2542; Recert= 7/06/23762-07/03/1516    PT Start Time 19    Equipment Utilized During Treatment Gait belt    Activity Tolerance Patient tolerated treatment well    Behavior During Therapy [facility] for tasks assessed/performed             Past Medical History:  Diagnosis Date   Allergy    Penicillin   Arthritis    Basal cell carcinoma    BPH (benign prostatic hyperplasia)    Chronic kidney disease    had a kidney stone which per family was a cyst that was removed   Dysrhythmia    GERD (gastroesophageal reflux disease)    History of kidney stones    Hyperlipidemia    Hypertension    Left-sided low back pain with left-sided sciatica    Lung mass    Splenic vein thrombosis    Stroke ([place]) 05/22/2019   Thrombosis 12/2015   mural  area and no notation of heart attack    Past Surgical History:  Procedure Laterality Date   APPENDECTOMY     CATARACT EXTRACTION, BILATERAL     CYSTOSCOPY WITH INSERTION OF UROLIFT     ENDOSCOPIC RETROGRADE CHOLANGIOPANCREATOGRAPHY (ERCP) WITH PROPOFOL N/A 05/11/2020   Procedure: ENDOSCOPIC RETROGRADE CHOLANGIOPANCREATOGRAPHY (ERCP) WITH PROPOFOL;  Surgeon: [name], MD;  Location: [facility] ENDOSCOPY;  Service: Endoscopy;  Laterality: N/A;   ERCP N/A 10/23/2020   Procedure: ENDOSCOPIC RETROGRADE CHOLANGIOPANCREATOGRAPHY (ERCP);  Surgeon: [name], MD;  Location: [facility] ENDOSCOPY;   Service: Endoscopy;  Laterality: N/A;   EYE SURGERY  08/2018   IR EXCHANGE BILIARY DRAIN  05/04/2020   KIDNEY STONE SURGERY     KNEE SURGERY     PACEMAKER INSERTION N/A 07/13/2018   Procedure: INSERTION PACEMAKER-DUEL CHAMBER INITIAL IMPLANT;  Surgeon: Isaias Cowman, MD;  Location: [facility] ORS;  Service: Cardiovascular;  Laterality: N/A;   SPINE SURGERY      There were no vitals filed for this visit.   Subjective Assessment - 05/28/21 1308     Subjective Patient reports he is feeling pretty good today -states "oh you know- knee is achy but overall I am good."    Patient is accompained by: Family member    Pertinent History Pt referred for deconditioning and gait instability. He has had a decline since he last finished therapy. Pt had a hospital admission from 05/02/20 to 05/14/20 for acute cholecystitis and is now s/p cholecystostomy drain placement. Drain is still in place and pt is waiting to hear when they plan to remove it. Pt discharged to SNF and then returned home. In addition pt underwent radiation therapy for a RUL lung mass/cancer and had his last radiation treatment in September of 2021. He reports that he has continued to walk at home as his primary exercise. Pt was previously seen at this clinic for PT/OT due to weakness/imbalance s/p CVA. Prior history from 09/21/19: Pt is a  87 year old male who presents with imbalance and difficulty with gait following a right frontal CVA with left hemiparesis on 05/22/19.  He has completed inpatient and home health PT, and now presents for OP PT.  He was discharged from inpatient rehab at a supervision level of assistance, ambulating with a RW.  His family notes that he is impulsive and will try to ambulate without his RW at home.  Family is currently providing 24/7 supervision.    Limitations House hold activities;Walking    How long can you sit comfortably? no limitations    How long can you walk comfortably? he requires a RW    Patient Stated Goals  Pt wants to be able to walk his granddaughter down the aisle at her wedding November 2022    Currently in Pain? Yes    Pain Score 2     Pain Location Knee    Pain Orientation Left    Pain Descriptors / Indicators Aching    Pain Type Chronic pain    Pain Onset More than a month ago    Pain Frequency Constant    Aggravating Factors  Bending my knee, weight bearing    Pain Relieving Factors rest    Effect of Pain on Daily Activities Decreased standing tolerance with ADLs/walking               Interventions:   Nustep: L3 LE only x 5 min for warm up.  In //bars:   Static standing (1 LE on 6in block positioned in front of purple pad with opp LE in back) - hold 20 sec x 3 trials.  Added Head turning - increased difficulty maintaining balance - reaching for UE support.   Static stand - 1 LE on 6" step and other leg positioned behind on a blue airex pad- slightly wider than tandem- Basketball toss x 16 shots x 2 sets.- Patient required 1 UE at times to maintain balance   Static stand - 1 LE on 6" step and other leg positioned behind on a blue airex pad- slightly wider than tandem- Basketball toss x 16 shots x 2 sets.     Standing in bars with feet positioned on 1/2 white foam roll- curve side up and performing basketball shots x 16 balls x 2 sets   Standing in //bars with feet positioned close to tandem on blue balance beam x 16 shots x 2 sets. *Patient experienced unsteadiness during all tasks requiring intermittent UE support to maintain balance.  Gait- Focusing on erect posture- Min VC as he ambulated 150 feet using RW- he was able to respond to cues and performed well staying within walker.   Clinical Impression: Patient challenged with balance activities - dual tasking - not as much loss of balance backward today but still unsteady- he did demo improvement with less loss of balance with practice today. He also did well with VC for erect posture with walking and able to stay  within walker well today. Pt will benefit from further skilled PT to improve BLE strength, balance and functional mobility.                        PT Education - 05/28/21 1310     Education Details exercise technique    Person(s) Educated Patient    Methods Explanation;Demonstration;Tactile cues;Verbal cues    Comprehension Verbalized understanding;Returned demonstration;Verbal cues required;Need further instruction              PT  Short Term Goals - 01/21/21 1438       PT SHORT TERM GOAL #1   Title Pt will be independent with HEP in order to improve strength and balance in order to decrease fall risk and improve function at home.    Baseline 01/02/2021- Patient verbalize understanding of walking and seated exercises for home program. 01/21/21 patient has been more active and understands HEP.    Time 6    Period Weeks    Status Achieved    Target Date 01/10/21               PT Long Term Goals - 05/13/21 1426       PT LONG TERM GOAL #1   Title Pt will improve BERG by at least 3 points in order to demonstrate clinically significant improvement in balance.    Baseline 09/20/20: 32/56 (Previous discharge: 03/06/20: 46/56), 12/14: 32/56, 1/13: 31/56, 01/16/21 35/56, 02/06/2021=36/56. 02/18/2021= 37/56.03/20/2021- Deferred secondary to left knee aching- will attempt next visit. 05/06/2021= 39/56; 05/13/2021= 36/56 *patient having more left knee pain.    Time 12    Period Weeks    Status Achieved      PT LONG TERM GOAL #2   Title Pt will decrease TUG to below 14 seconds/decrease in order to demonstrate decreased fall risk.    Baseline 09/20/20: 19.2s, 12/14: 17.48, 1/13: 16.65 sec. 01/02/2021=15.75 sec using 4WW, 01/21/21  14.37 seconds. 02/06/2021= 14.92 sec using 4WW. 02/18/2021= 14.5 sec using 4WW. 03/20/2021- Deferred secondary to left knee aching- will attempt next visit. 05/06/2021= 16.32 sec avg using 4WW. 05/13/2021= 16.5 sec with 4WW *increased left knee pain.    Time  12    Period Weeks    Status On-going    Target Date 08/05/21      PT LONG TERM GOAL #3   Title Pt will decrease 5TSTS by at least 3 seconds in order to demonstrate clinically significant improvement in LE strength.    Baseline 09/20/20: 23.1s with minA+1 for strength and balance (Previous discharge 03/06/20: 16.1s), 12/14: 20 sec, 1/13: 22 sec.   01/02/2021= 16.05 sec with light UE support. 01/21/21 01/21/21 37.90 seconds without UE, with UE support 12.93 seconds: 02/06/2021= with 1 UE 17.93 sec. 02/18/2021= 21.33 sec without UE support. 05/06/2021= 15.3 sec with BUE Support and 21. 0 sec without UE support. 05/13/2021=15.0 sec with BUE support; 16 sec without UE support.    Time 12    Period Weeks    Status Achieved      PT LONG TERM GOAL #4   Title Pt will increase his self-selected 10MWT to >0.74 m/s in order to demonstrate improvement in gait speed and improved community ambulation    Baseline 09/20/20: self-selected: 14.4s = 0.69 m/s with rollator (Previous discharge 03/06/20: self-selected: 13.6s = 0.74 m/s), 12/14: 0.75 m/s, 1/13: 0.78 m/s. 01/02/2021= 0.85 m/s using 4WW, 01/16/21 with 4 WW .99 m/s. 02/06/2021= self selected = 0.93 m/s with 4IH    Time 4    Period Weeks    Status Achieved      PT LONG TERM GOAL #5   Title Patient will increase six minute walk test distance to >1000 for progression to community ambulator and improve gait ability    Baseline 01/10/20  620 ft with rollator, 01/21/21  965 ft; 02/06/2021= 910 feet with 4WW.  02/18/2021= 950 feet with 4WW. 05/06/2021= will test next visit. 05/13/2021- Deferred secondary to increased left knee pain. Will keep goal active as patient was making significant progress.  Time 12    Period Weeks    Status On-going    Target Date 08/05/21      Additional Long Term Goals   Additional Long Term Goals Yes      PT LONG TERM GOAL #6   Title Patient will demonstrate ability to walk with HHA +1 > 200 ft. without loss of balance on level surfaces to  simulate walking his grandaughter down the aisle for her upcoming wedding in November 2022.    Baseline 05/13/2021- Patient currently ambulating short community distance with use of 4WW but desires to walk with his grandaughter with HHA for her wedding.    Time 12    Period Weeks    Status New    Target Date 08/05/21      PT LONG TERM GOAL #7   Title Pt will decrease 5TSTS  to 14 sec or less with BUE support on armrest in order to demonstrate clinically significant improvement in LE strength.    Baseline 05/13/2021=5.0 sec with BUE support; 16 sec without UE support.    Time 12    Period Weeks    Status New    Target Date 08/05/21                   Plan - 05/28/21 1310     Personal Factors and Comorbidities Age;Comorbidity 3+    Comorbidities HTN, CVA, cholecystitis with cholecystostomy drain    Examination-Activity Limitations Bend;Lift;Squat;Locomotion Level;Stairs;Stand    Examination-Participation Restrictions Community Activity;Driving;Laundry;Meal Prep;Shop    Stability/Clinical Decision Making Unstable/Unpredictable    Rehab Potential Fair    PT Frequency 2x / week    PT Duration 12 weeks    PT Treatment/Interventions ADLs/Self Care Home Management;Cryotherapy;Electrical Stimulation;Iontophoresis 4mg /ml Dexamethasone;Moist Heat;Traction;Ultrasound;DME Instruction;Gait training;Stair training;Therapeutic activities;Therapeutic exercise;Functional mobility training;Balance training;Neuromuscular re-education;Patient/family education;Manual techniques;Dry needling;Vestibular;Joint Manipulations;Spinal Manipulations;Aquatic Therapy    PT Next Visit Plan Continue to focus on LE strengthening and balance, dynamic balance tasks    PT Home Exercise Plan no changes    Consulted and Agree with Plan of Care Patient;Family member/caregiver             Patient will benefit from skilled therapeutic intervention in order to improve the following deficits and impairments:   Abnormal gait, Decreased balance, Decreased mobility, Difficulty walking, Decreased knowledge of precautions, Decreased safety awareness, Decreased activity tolerance, Decreased strength, Decreased endurance  Visit Diagnosis: Abnormality of gait and mobility  Difficulty in walking, not elsewhere classified  Other lack of coordination     Problem List Patient Active Problem List   Diagnosis Date Noted   Moderate protein-calorie malnutrition ([place]) 03/11/2021   Hemiparesis affecting left side as late effect of cerebrovascular accident (CVA) ([place]) 03/11/2021   Aneurysm, aorta, thoracic ([place]) 12/21/2020   Calculus of bile duct without cholecystitis and without obstruction    Non-small cell carcinoma of lung, right ([place]) 09/07/2020   Mass of upper lobe of right lung 07/13/2020   Calculus of bile duct with cholecystitis without obstruction    Abnormal findings on imaging of biliary tract    Acute cholecystitis 05/02/2020   Chronic venous insufficiency 12/05/2019   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    CKD (chronic kidney disease), stage II    New onset atrial fibrillation ([facility])    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   Goals of care, counseling/discussion    Palliative care by specialist    DNR (do not resuscitate) discussion    History of CVA (cerebrovascular  accident) 05/22/2019   Persistent proteinuria 07/30/2018   Mobitz type 2 second degree heart block 07/13/2018   Chronic kidney disease, stage III (moderate) ([place]) 06/15/2018   Leg pain 05/06/2018   Lymphedema 05/06/2018   Coagulopathy ([place]) 07/07/2017   Lumbar spondylosis 03/10/2017   Elevated uric acid in blood 11/20/2016   BPH (benign prostatic hyperplasia) 05/29/2016   Peripheral vascular disease of lower extremity ([place]) 03/03/2016   Gallstone 12/31/2015   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   Atherosclerosis of aorta ([place]) 11/22/2015   Carotid artery narrowing  11/22/2015   Diverticulosis of colon 11/22/2015   Decreased creatinine clearance 11/22/2015   Arthritis, degenerative 11/22/2015   Lactose intolerance 11/22/2015   Basal cell carcinoma 11/22/2015   Essential hypertension 06/21/2015   Hyperlipemia 06/21/2015   GERD (gastroesophageal reflux disease) 06/21/2015   Calculus of kidney 11/18/2013    Lewis Moccasin, PT 05/28/2021, 1:11 PM  [place] MAIN [facility] SERVICES [address] [place], [place], [address] Phone: [phone]   Fax:  [phone]  Name: Jose Castro MRN: [id number] Date of Birth: [birth year]

## 2021-05-30 ENCOUNTER — Other Ambulatory Visit: Payer: Self-pay

## 2021-05-30 ENCOUNTER — Ambulatory Visit: Payer: Medicare Other

## 2021-05-30 DIAGNOSIS — R269 Unspecified abnormalities of gait and mobility: Secondary | ICD-10-CM | POA: Diagnosis not present

## 2021-05-30 DIAGNOSIS — M6281 Muscle weakness (generalized): Secondary | ICD-10-CM

## 2021-05-30 DIAGNOSIS — R278 Other lack of coordination: Secondary | ICD-10-CM

## 2021-05-30 DIAGNOSIS — R262 Difficulty in walking, not elsewhere classified: Secondary | ICD-10-CM

## 2021-05-30 NOTE — Therapy (Signed)
[place] MAIN [facility] SERVICES [address] [place], [place], [address] Phone: [phone]   Fax:  [phone]  Physical Therapy Treatment  Patient Details  Name: Jose Castro MRN: [id number] Date of Birth: Mar 20, 1926 Referring Provider (PT): Dr. Ancil Boozer   Encounter Date: 05/30/2021   PT End of Session - 05/30/21 1153     Visit Number 87    Number of Visits 61    Date for PT Re-Evaluation 08/05/21    Authorization Type eval: 09/20/20, Progress note 01/02/2021, PN on 08/03/8181, Recert = 9/93/7169-6/78/9381; PN on 4/202022; PN on 0/12/7508; Recert= 2/58/52778-01/05/2352    PT Start Time 1146    PT Stop Time 1228    PT Time Calculation (min) 42 min    Equipment Utilized During Treatment Gait belt    Activity Tolerance Patient tolerated treatment well    Behavior During Therapy WFL for tasks assessed/performed             Past Medical History:  Diagnosis Date   Allergy    Penicillin   Arthritis    Basal cell carcinoma    BPH (benign prostatic hyperplasia)    Chronic kidney disease    had a kidney stone which per family was a cyst that was removed   Dysrhythmia    GERD (gastroesophageal reflux disease)    History of kidney stones    Hyperlipidemia    Hypertension    Left-sided low back pain with left-sided sciatica    Lung mass    Splenic vein thrombosis    Stroke (Jose Castro) 05/22/2019   Thrombosis 12/2015   mural  area and no notation of heart attack    Past Surgical History:  Procedure Laterality Date   APPENDECTOMY     CATARACT EXTRACTION, BILATERAL     CYSTOSCOPY WITH INSERTION OF UROLIFT     ENDOSCOPIC RETROGRADE CHOLANGIOPANCREATOGRAPHY (ERCP) WITH PROPOFOL N/A 05/11/2020   Procedure: ENDOSCOPIC RETROGRADE CHOLANGIOPANCREATOGRAPHY (ERCP) WITH PROPOFOL;  Surgeon: [name], MD;  Location: [facility] ENDOSCOPY;  Service: Endoscopy;  Laterality: N/A;   ERCP N/A 10/23/2020   Procedure: ENDOSCOPIC RETROGRADE CHOLANGIOPANCREATOGRAPHY  (ERCP);  Surgeon: [name], MD;  Location: [facility] ENDOSCOPY;  Service: Endoscopy;  Laterality: N/A;   EYE SURGERY  08/2018   IR EXCHANGE BILIARY DRAIN  05/04/2020   KIDNEY STONE SURGERY     KNEE SURGERY     PACEMAKER INSERTION N/A 07/13/2018   Procedure: INSERTION PACEMAKER-DUEL CHAMBER INITIAL IMPLANT;  Surgeon: Isaias Cowman, MD;  Location: [facility] ORS;  Service: Cardiovascular;  Laterality: N/A;   SPINE SURGERY      There were no vitals filed for this visit.   Subjective Assessment - 05/30/21 1149     Subjective Patient reports feeling about the same - No worse that is for sure.    Patient is accompained by: Family member    Pertinent History Pt referred for deconditioning and gait instability. He has had a decline since he last finished therapy. Pt had a hospital admission from 05/02/20 to 05/14/20 for acute cholecystitis and is now s/p cholecystostomy drain placement. Drain is still in place and pt is waiting to hear when they plan to remove it. Pt discharged to SNF and then returned home. In addition pt underwent radiation therapy for a RUL lung mass/cancer and had his last radiation treatment in September of 2021. He reports that he has continued to walk at home as his primary exercise. Pt was previously seen at this clinic for PT/OT due to weakness/imbalance  s/p CVA. Prior history from 09/21/19: Pt is a 87 year old male who presents with imbalance and difficulty with gait following a right frontal CVA with left hemiparesis on 05/22/19.  He has completed inpatient and home health PT, and now presents for OP PT.  He was discharged from inpatient rehab at a supervision level of assistance, ambulating with a RW.  His family notes that he is impulsive and will try to ambulate without his RW at home.  Family is currently providing 24/7 supervision.    Limitations House hold activities;Walking    How long can you sit comfortably? no limitations    How long can you walk comfortably? he requires a RW     Patient Stated Goals Pt wants to be able to walk his granddaughter down the aisle at her wedding November 2022    Currently in Pain? Yes    Pain Score 4     Pain Location Knee    Pain Orientation Left    Pain Descriptors / Indicators Aching;Sore    Pain Type Chronic pain    Pain Onset More than a month ago    Pain Frequency Constant    Aggravating Factors  Bending knee, prolonged standing    Pain Relieving Factors rest    Effect of Pain on Daily Activities Decreaed  standing    Multiple Pain Sites No              Interventions: Nustep L3 LE only - .20 mi total - warm- up.   Step training: Patient reports have 5 steps to a platform then 8 more up to his second story with B rails  Patient preformed 4 steps up/down with B rails x 4 trials (total of 16 steps)  Sit to stand holding a PVC pipe to facilitate a forward lean- 10 reps - no UE support - no worsening report of left knee pain.   Postural strengthening:   Seated BUE flex holding onto pvc with 4 lb weight wrapped around it. 10 reps  Seated BUE shoulder press using PVC with 4lb. Weight wrapped around the pipe x 10 reps.  Standing scap retraction GTB 2 sets of 15 reps Standing shoulder ext GTB 2 sets of 15 reps   Neuro re-ed:   Static stand on 1/2 foam roll (curved end up) 5 trials up to 20 sec - Patient improved with each trial  Static stand on 1/2 foam roll (curved end down) 10 trials up to 10 sec - Patient with much more difficulty with more posterior sway.  Education provided throughout session via VC/TC and demonstration to facilitate movement at target joints and correct muscle activation for all testing and exercises performed.   Clinical Impression: Patient performed well with postural strengthening and well motivated throughout session today. He continues to experience more posterior sway and loss of balance with progressive balance activities. Pt will benefit from further skilled PT to improve BLE strength,  balance and functional mobility.                               PT Education - 05/30/21 1152     Education Details exercise technique    Person(s) Educated Patient    Methods Explanation;Demonstration;Tactile cues;Verbal cues    Comprehension Verbalized understanding;Verbal cues required;Returned demonstration;Need further instruction;Tactile cues required              PT Short Term Goals - 01/21/21 1438  PT SHORT TERM GOAL #1   Title Pt will be independent with HEP in order to improve strength and balance in order to decrease fall risk and improve function at home.    Baseline 01/02/2021- Patient verbalize understanding of walking and seated exercises for home program. 01/21/21 patient has been more active and understands HEP.    Time 6    Period Weeks    Status Achieved    Target Date 01/10/21               PT Long Term Goals - 05/13/21 1426       PT LONG TERM GOAL #1   Title Pt will improve BERG by at least 3 points in order to demonstrate clinically significant improvement in balance.    Baseline 09/20/20: 32/56 (Previous discharge: 03/06/20: 46/56), 12/14: 32/56, 1/13: 31/56, 01/16/21 35/56, 02/06/2021=36/56. 02/18/2021= 37/56.03/20/2021- Deferred secondary to left knee aching- will attempt next visit. 05/06/2021= 39/56; 05/13/2021= 36/56 *patient having more left knee pain.    Time 12    Period Weeks    Status Achieved      PT LONG TERM GOAL #2   Title Pt will decrease TUG to below 14 seconds/decrease in order to demonstrate decreased fall risk.    Baseline 09/20/20: 19.2s, 12/14: 17.48, 1/13: 16.65 sec. 01/02/2021=15.75 sec using 4WW, 01/21/21  14.37 seconds. 02/06/2021= 14.92 sec using 4WW. 02/18/2021= 14.5 sec using 4WW. 03/20/2021- Deferred secondary to left knee aching- will attempt next visit. 05/06/2021= 16.32 sec avg using 4WW. 05/13/2021= 16.5 sec with 4WW *increased left knee pain.    Time 12    Period Weeks    Status On-going    Target Date  08/05/21      PT LONG TERM GOAL #3   Title Pt will decrease 5TSTS by at least 3 seconds in order to demonstrate clinically significant improvement in LE strength.    Baseline 09/20/20: 23.1s with minA+1 for strength and balance (Previous discharge 03/06/20: 16.1s), 12/14: 20 sec, 1/13: 22 sec.   01/02/2021= 16.05 sec with light UE support. 01/21/21 01/21/21 37.90 seconds without UE, with UE support 12.93 seconds: 02/06/2021= with 1 UE 17.93 sec. 02/18/2021= 21.33 sec without UE support. 05/06/2021= 15.3 sec with BUE Support and 21. 0 sec without UE support. 05/13/2021=15.0 sec with BUE support; 16 sec without UE support.    Time 12    Period Weeks    Status Achieved      PT LONG TERM GOAL #4   Title Pt will increase his self-selected 10MWT to >0.74 m/s in order to demonstrate improvement in gait speed and improved community ambulation    Baseline 09/20/20: self-selected: 14.4s = 0.69 m/s with rollator (Previous discharge 03/06/20: self-selected: 13.6s = 0.74 m/s), 12/14: 0.75 m/s, 1/13: 0.78 m/s. 01/02/2021= 0.85 m/s using 4WW, 01/16/21 with 4 WW .99 m/s. 02/06/2021= self selected = 0.93 m/s with 0DX    Time 4    Period Weeks    Status Achieved      PT LONG TERM GOAL #5   Title Patient will increase six minute walk test distance to >1000 for progression to community ambulator and improve gait ability    Baseline 01/10/20  620 ft with rollator, 01/21/21  965 ft; 02/06/2021= 910 feet with 4WW.  02/18/2021= 950 feet with 4WW. 05/06/2021= will test next visit. 05/13/2021- Deferred secondary to increased left knee pain. Will keep goal active as patient was making significant progress.    Time 12    Period Weeks  Status On-going    Target Date 08/05/21      Additional Long Term Goals   Additional Long Term Goals Yes      PT LONG TERM GOAL #6   Title Patient will demonstrate ability to walk with HHA +1 > 200 ft. without loss of balance on level surfaces to simulate walking his grandaughter down the aisle for her  upcoming wedding in November 2022.    Baseline 05/13/2021- Patient currently ambulating short community distance with use of 4WW but desires to walk with his grandaughter with HHA for her wedding.    Time 12    Period Weeks    Status New    Target Date 08/05/21      PT LONG TERM GOAL #7   Title Pt will decrease 5TSTS  to 14 sec or less with BUE support on armrest in order to demonstrate clinically significant improvement in LE strength.    Baseline 05/13/2021=5.0 sec with BUE support; 16 sec without UE support.    Time 12    Period Weeks    Status New    Target Date 08/05/21                   Plan - 05/30/21 1827     Clinical Impression Statement Patient performed well with postural strengthening and well motivated throughout session today. He continues to experience more posterior sway and loss of balance with progressive balance activities. Pt will benefit from further skilled PT to improve BLE strength, balance and functional mobility.    Personal Factors and Comorbidities Age;Comorbidity 3+    Comorbidities HTN, CVA, cholecystitis with cholecystostomy drain    Examination-Activity Limitations Bend;Lift;Squat;Locomotion Level;Stairs;Stand    Examination-Participation Restrictions Community Activity;Driving;Laundry;Meal Prep;Shop    Stability/Clinical Decision Making Unstable/Unpredictable    Rehab Potential Fair    PT Frequency 2x / week    PT Duration 12 weeks    PT Treatment/Interventions ADLs/Self Care Home Management;Cryotherapy;Electrical Stimulation;Iontophoresis 4mg /ml Dexamethasone;Moist Heat;Traction;Ultrasound;DME Instruction;Gait training;Stair training;Therapeutic activities;Therapeutic exercise;Functional mobility training;Balance training;Neuromuscular re-education;Patient/family education;Manual techniques;Dry needling;Vestibular;Joint Manipulations;Spinal Manipulations;Aquatic Therapy    PT Next Visit Plan Continue to focus on LE strengthening and balance,  dynamic balance tasks    PT Home Exercise Plan no changes    Consulted and Agree with Plan of Care Patient;Family member/caregiver             Patient will benefit from skilled therapeutic intervention in order to improve the following deficits and impairments:  Abnormal gait, Decreased balance, Decreased mobility, Difficulty walking, Decreased knowledge of precautions, Decreased safety awareness, Decreased activity tolerance, Decreased strength, Decreased endurance  Visit Diagnosis: Abnormality of gait and mobility  Difficulty in walking, not elsewhere classified  Muscle weakness (generalized)  Other lack of coordination     Problem List Patient Active Problem List   Diagnosis Date Noted   Moderate protein-calorie malnutrition ([place]) 03/11/2021   Hemiparesis affecting left side as late effect of cerebrovascular accident (CVA) ([place]) 03/11/2021   Aneurysm, aorta, thoracic ([place]) 12/21/2020   Calculus of bile duct without cholecystitis and without obstruction    Non-small cell carcinoma of lung, right ([place]) 09/07/2020   Mass of upper lobe of right lung 07/13/2020   Calculus of bile duct with cholecystitis without obstruction    Abnormal findings on imaging of biliary tract    Acute cholecystitis 05/02/2020   Chronic venous insufficiency 12/05/2019   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    CKD (chronic kidney disease), stage II    New  onset atrial fibrillation (HCC)    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   Goals of care, counseling/discussion    Palliative care by specialist    DNR (do not resuscitate) discussion    History of CVA (cerebrovascular accident) 05/22/2019   Persistent proteinuria 07/30/2018   Mobitz type 2 second degree heart block 07/13/2018   Chronic kidney disease, stage III (moderate) ([place]) 06/15/2018   Leg pain 05/06/2018   Lymphedema 05/06/2018   Coagulopathy ([place]) 07/07/2017   Lumbar spondylosis 03/10/2017   Elevated uric  acid in blood 11/20/2016   BPH (benign prostatic hyperplasia) 05/29/2016   Peripheral vascular disease of lower extremity ([place]) 03/03/2016   Gallstone 12/31/2015   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   Atherosclerosis of aorta ([place]) 11/22/2015   Carotid artery narrowing 11/22/2015   Diverticulosis of colon 11/22/2015   Decreased creatinine clearance 11/22/2015   Arthritis, degenerative 11/22/2015   Lactose intolerance 11/22/2015   Basal cell carcinoma 11/22/2015   Essential hypertension 06/21/2015   Hyperlipemia 06/21/2015   GERD (gastroesophageal reflux disease) 06/21/2015   Calculus of kidney 11/18/2013    Lewis Moccasin, PT 05/30/2021, 6:30 PM  [place] MAIN [facility] SERVICES [address] [place], [place], [address] Phone: [phone]   Fax:  [phone]  Name: TEVON BERHANE MRN: [id number] Date of Birth: 10/20/1926

## 2021-06-04 ENCOUNTER — Other Ambulatory Visit: Payer: Self-pay

## 2021-06-04 ENCOUNTER — Ambulatory Visit: Payer: Medicare Other | Attending: Family Medicine

## 2021-06-04 DIAGNOSIS — R269 Unspecified abnormalities of gait and mobility: Secondary | ICD-10-CM | POA: Diagnosis not present

## 2021-06-04 DIAGNOSIS — I63511 Cerebral infarction due to unspecified occlusion or stenosis of right middle cerebral artery: Secondary | ICD-10-CM | POA: Insufficient documentation

## 2021-06-04 DIAGNOSIS — R278 Other lack of coordination: Secondary | ICD-10-CM | POA: Insufficient documentation

## 2021-06-04 DIAGNOSIS — M6281 Muscle weakness (generalized): Secondary | ICD-10-CM | POA: Insufficient documentation

## 2021-06-04 DIAGNOSIS — R262 Difficulty in walking, not elsewhere classified: Secondary | ICD-10-CM | POA: Insufficient documentation

## 2021-06-04 DIAGNOSIS — R2681 Unsteadiness on feet: Secondary | ICD-10-CM | POA: Insufficient documentation

## 2021-06-04 DIAGNOSIS — R29898 Other symptoms and signs involving the musculoskeletal system: Secondary | ICD-10-CM | POA: Insufficient documentation

## 2021-06-04 NOTE — Therapy (Signed)
[place] MAIN [facility] SERVICES [address] [place], [place], [address] Phone: [phone]   Fax:  [phone]  Physical Therapy Treatment  Patient Details  Name: Jose Castro MRN: [id number] Date of Birth: 1926/11/05 Referring Provider (PT): Dr. Ancil Boozer   Encounter Date: 06/04/2021   PT End of Session - 06/04/21 1352     Visit Number 3    Number of Visits 49    Date for PT Re-Evaluation 08/05/21    Authorization Type eval: 09/20/20, Progress note 01/02/2021, PN on 12/05/4006, Recert = 6/76/1950-9/32/6712; PN on 4/202022; PN on 03/05/8098; Recert= 8/33/82505-02/07/7672    PT Start Time 1345    PT Stop Time 1430    PT Time Calculation (min) 45 min    Equipment Utilized During Treatment Gait belt    Activity Tolerance Patient tolerated treatment well    Behavior During Therapy WFL for tasks assessed/performed             Past Medical History:  Diagnosis Date   Allergy    Penicillin   Arthritis    Basal cell carcinoma    BPH (benign prostatic hyperplasia)    Chronic kidney disease    had a kidney stone which per family was a cyst that was removed   Dysrhythmia    GERD (gastroesophageal reflux disease)    History of kidney stones    Hyperlipidemia    Hypertension    Left-sided low back pain with left-sided sciatica    Lung mass    Splenic vein thrombosis    Stroke ([place]) 05/22/2019   Thrombosis 12/2015   mural  area and no notation of heart attack    Past Surgical History:  Procedure Laterality Date   APPENDECTOMY     CATARACT EXTRACTION, BILATERAL     CYSTOSCOPY WITH INSERTION OF UROLIFT     ENDOSCOPIC RETROGRADE CHOLANGIOPANCREATOGRAPHY (ERCP) WITH PROPOFOL N/A 05/11/2020   Procedure: ENDOSCOPIC RETROGRADE CHOLANGIOPANCREATOGRAPHY (ERCP) WITH PROPOFOL;  Surgeon: [name], MD;  Location: [facility] ENDOSCOPY;  Service: Endoscopy;  Laterality: N/A;   ERCP N/A 10/23/2020   Procedure: ENDOSCOPIC RETROGRADE CHOLANGIOPANCREATOGRAPHY  (ERCP);  Surgeon: [name], MD;  Location: [facility] ENDOSCOPY;  Service: Endoscopy;  Laterality: N/A;   EYE SURGERY  08/2018   IR EXCHANGE BILIARY DRAIN  05/04/2020   KIDNEY STONE SURGERY     KNEE SURGERY     PACEMAKER INSERTION N/A 07/13/2018   Procedure: INSERTION PACEMAKER-DUEL CHAMBER INITIAL IMPLANT;  Surgeon: Isaias Cowman, MD;  Location: [facility] ORS;  Service: Cardiovascular;  Laterality: N/A;   SPINE SURGERY      There were no vitals filed for this visit.   Subjective Assessment - 06/04/21 1350     Subjective Patient reports feeling okay- States he had a good holiday weekend.    Patient is accompained by: Family member    Pertinent History Pt referred for deconditioning and gait instability. He has had a decline since he last finished therapy. Pt had a hospital admission from 05/02/20 to 05/14/20 for acute cholecystitis and is now s/p cholecystostomy drain placement. Drain is still in place and pt is waiting to hear when they plan to remove it. Pt discharged to SNF and then returned home. In addition pt underwent radiation therapy for a RUL lung mass/cancer and had his last radiation treatment in September of 2021. He reports that he has continued to walk at home as his primary exercise. Pt was previously seen at this clinic for PT/OT due to weakness/imbalance s/p CVA.  Prior history from 09/21/19: Pt is a 87 year old male who presents with imbalance and difficulty with gait following a right frontal CVA with left hemiparesis on 05/22/19.  He has completed inpatient and home health PT, and now presents for OP PT.  He was discharged from inpatient rehab at a supervision level of assistance, ambulating with a RW.  His family notes that he is impulsive and will try to ambulate without his RW at home.  Family is currently providing 24/7 supervision.    Limitations House hold activities;Walking    How long can you sit comfortably? no limitations    How long can you walk comfortably? he requires a RW     Patient Stated Goals Pt wants to be able to walk his granddaughter down the aisle at her wedding November 2022    Currently in Pain? Yes    Pain Score 4     Pain Location Knee    Pain Orientation Left    Pain Descriptors / Indicators Aching    Pain Type Chronic pain    Pain Onset More than a month ago    Pain Frequency Constant    Aggravating Factors  bending knee, Prolonged standing    Pain Relieving Factors rest    Effect of Pain on Daily Activities Decreased standing    Multiple Pain Sites No               Interventions:   Nustep LE on seat 10 and resistance Level 2 x 5 min for warm up.   Heel to toe gait sequencing activity in // bars: GTB around left ankle - instructed patient to swing his left LE forward toward a 1/2 foam roll. Patient performed 3 sets of 10 reps each.   Forward step over objects in // bars- PVC, 1/2 foam roll, and 2 1/2 spike ball x 5 trials.   Side step activity in //bars: Side step with BUE support over 2 orange hurdles and pvc pipe and white foam - VC to maintain erect posture. Patient able to clear each obstacle well with no loss of balance. `  Seated Dorsiflexion - Resistive with GTB 2 sets of 12 reps BLE- Patient required VC to slow down for proper control.   Sit to stand from chair while holding onto 2lb ball with outstretched hands- Leaning forward and pressing up x 10 reps. Increased difficulty requiring CGA.   Scap retraction using matrix cable system - standing with CGA x 2 sets: 1) 7.5 lb- BUE x 12 reps and 2) 12.5 lb BUE x 12 reps. Patient without loss of balance yet required CGA secondary to min unsteadiness.   Education provided throughout session via VC/TC and demonstration to facilitate movement at target joints and correct muscle activation for all exercises performed.   Clinical Impression: Patient responded well to all strengthening exercises including adequate step length with resistive band and no loss of balance with LE  strengthening/dynamic activities in bars. He fatigues yet able to complete all activities with minimal rest break today. Pt will benefit from further skilled PT to improve BLE strength, balance and functional mobility.                        PT Education - 06/04/21 1351     Education Details specifice exercise technique    Person(s) Educated Patient    Methods Explanation;Demonstration;Tactile cues;Verbal cues    Comprehension Verbalized understanding;Returned demonstration;Verbal cues required;Tactile cues required;Need further instruction  PT Short Term Goals - 01/21/21 1438       PT SHORT TERM GOAL #1   Title Pt will be independent with HEP in order to improve strength and balance in order to decrease fall risk and improve function at home.    Baseline 01/02/2021- Patient verbalize understanding of walking and seated exercises for home program. 01/21/21 patient has been more active and understands HEP.    Time 6    Period Weeks    Status Achieved    Target Date 01/10/21               PT Long Term Goals - 05/13/21 1426       PT LONG TERM GOAL #1   Title Pt will improve BERG by at least 3 points in order to demonstrate clinically significant improvement in balance.    Baseline 09/20/20: 32/56 (Previous discharge: 03/06/20: 46/56), 12/14: 32/56, 1/13: 31/56, 01/16/21 35/56, 02/06/2021=36/56. 02/18/2021= 37/56.03/20/2021- Deferred secondary to left knee aching- will attempt next visit. 05/06/2021= 39/56; 05/13/2021= 36/56 *patient having more left knee pain.    Time 12    Period Weeks    Status Achieved      PT LONG TERM GOAL #2   Title Pt will decrease TUG to below 14 seconds/decrease in order to demonstrate decreased fall risk.    Baseline 09/20/20: 19.2s, 12/14: 17.48, 1/13: 16.65 sec. 01/02/2021=15.75 sec using 4WW, 01/21/21  14.37 seconds. 02/06/2021= 14.92 sec using 4WW. 02/18/2021= 14.5 sec using 4WW. 03/20/2021- Deferred secondary to left knee  aching- will attempt next visit. 05/06/2021= 16.32 sec avg using 4WW. 05/13/2021= 16.5 sec with 4WW *increased left knee pain.    Time 12    Period Weeks    Status On-going    Target Date 08/05/21      PT LONG TERM GOAL #3   Title Pt will decrease 5TSTS by at least 3 seconds in order to demonstrate clinically significant improvement in LE strength.    Baseline 09/20/20: 23.1s with minA+1 for strength and balance (Previous discharge 03/06/20: 16.1s), 12/14: 20 sec, 1/13: 22 sec.   01/02/2021= 16.05 sec with light UE support. 01/21/21 01/21/21 37.90 seconds without UE, with UE support 12.93 seconds: 02/06/2021= with 1 UE 17.93 sec. 02/18/2021= 21.33 sec without UE support. 05/06/2021= 15.3 sec with BUE Support and 21. 0 sec without UE support. 05/13/2021=15.0 sec with BUE support; 16 sec without UE support.    Time 12    Period Weeks    Status Achieved      PT LONG TERM GOAL #4   Title Pt will increase his self-selected 10MWT to >0.74 m/s in order to demonstrate improvement in gait speed and improved community ambulation    Baseline 09/20/20: self-selected: 14.4s = 0.69 m/s with rollator (Previous discharge 03/06/20: self-selected: 13.6s = 0.74 m/s), 12/14: 0.75 m/s, 1/13: 0.78 m/s. 01/02/2021= 0.85 m/s using 4WW, 01/16/21 with 4 WW .99 m/s. 02/06/2021= self selected = 0.93 m/s with 5YK    Time 4    Period Weeks    Status Achieved      PT LONG TERM GOAL #5   Title Patient will increase six minute walk test distance to >1000 for progression to community ambulator and improve gait ability    Baseline 01/10/20  620 ft with rollator, 01/21/21  965 ft; 02/06/2021= 910 feet with 4WW.  02/18/2021= 950 feet with 4WW. 05/06/2021= will test next visit. 05/13/2021- Deferred secondary to increased left knee pain. Will keep goal active as patient was making significant  progress.    Time 12    Period Weeks    Status On-going    Target Date 08/05/21      Additional Long Term Goals   Additional Long Term Goals Yes      PT LONG  TERM GOAL #6   Title Patient will demonstrate ability to walk with HHA +1 > 200 ft. without loss of balance on level surfaces to simulate walking his grandaughter down the aisle for her upcoming wedding in November 2022.    Baseline 05/13/2021- Patient currently ambulating short community distance with use of 4WW but desires to walk with his grandaughter with HHA for her wedding.    Time 12    Period Weeks    Status New    Target Date 08/05/21      PT LONG TERM GOAL #7   Title Pt will decrease 5TSTS  to 14 sec or less with BUE support on armrest in order to demonstrate clinically significant improvement in LE strength.    Baseline 05/13/2021=5.0 sec with BUE support; 16 sec without UE support.    Time 12    Period Weeks    Status New    Target Date 08/05/21                   Plan - 06/04/21 1353     Clinical Impression Statement Patient responded well to all strengthening exercises including adequate step length with resistive band and no loss of balance with LE strengthening/dynamic activities in bars. He fatigues yet able to complete all activities with minimal rest break today. Pt will benefit from further skilled PT to improve BLE strength, balance and functional mobility.    Personal Factors and Comorbidities Age;Comorbidity 3+    Comorbidities HTN, CVA, cholecystitis with cholecystostomy drain    Examination-Activity Limitations Bend;Lift;Squat;Locomotion Level;Stairs;Stand    Examination-Participation Restrictions Community Activity;Driving;Laundry;Meal Prep;Shop    Stability/Clinical Decision Making Unstable/Unpredictable    Rehab Potential Fair    PT Frequency 2x / week    PT Duration 12 weeks    PT Treatment/Interventions ADLs/Self Care Home Management;Cryotherapy;Electrical Stimulation;Iontophoresis 4mg /ml Dexamethasone;Moist Heat;Traction;Ultrasound;DME Instruction;Gait training;Stair training;Therapeutic activities;Therapeutic exercise;Functional mobility  training;Balance training;Neuromuscular re-education;Patient/family education;Manual techniques;Dry needling;Vestibular;Joint Manipulations;Spinal Manipulations;Aquatic Therapy    PT Next Visit Plan Continue to focus on LE strengthening and balance, dynamic balance tasks    PT Home Exercise Plan no changes    Consulted and Agree with Plan of Care Patient             Patient will benefit from skilled therapeutic intervention in order to improve the following deficits and impairments:  Abnormal gait, Decreased balance, Decreased mobility, Difficulty walking, Decreased knowledge of precautions, Decreased safety awareness, Decreased activity tolerance, Decreased strength, Decreased endurance  Visit Diagnosis: Abnormality of gait and mobility  Difficulty in walking, not elsewhere classified  Muscle weakness (generalized)  Unsteadiness on feet     Problem List Patient Active Problem List   Diagnosis Date Noted   Moderate protein-calorie malnutrition ([place]) 03/11/2021   Hemiparesis affecting left side as late effect of cerebrovascular accident (CVA) ([place]) 03/11/2021   Aneurysm, aorta, thoracic ([place]) 12/21/2020   Calculus of bile duct without cholecystitis and without obstruction    Non-small cell carcinoma of lung, right ([place]) 09/07/2020   Mass of upper lobe of right lung 07/13/2020   Calculus of bile duct with cholecystitis without obstruction    Abnormal findings on imaging of biliary tract    Acute cholecystitis 05/02/2020   Chronic venous insufficiency 12/05/2019  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    CKD (chronic kidney disease), stage II    New onset atrial fibrillation ([facility])    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   Goals of care, counseling/discussion    Palliative care by specialist    DNR (do not resuscitate) discussion    History of CVA (cerebrovascular accident) 05/22/2019   Persistent proteinuria 07/30/2018   Mobitz type 2 second degree  heart block 07/13/2018   Chronic kidney disease, stage III (moderate) ([place]) 06/15/2018   Leg pain 05/06/2018   Lymphedema 05/06/2018   Coagulopathy ([place]) 07/07/2017   Lumbar spondylosis 03/10/2017   Elevated uric acid in blood 11/20/2016   BPH (benign prostatic hyperplasia) 05/29/2016   Peripheral vascular disease of lower extremity ([place]) 03/03/2016   Gallstone 12/31/2015   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   Atherosclerosis of aorta ([place]) 11/22/2015   Carotid artery narrowing 11/22/2015   Diverticulosis of colon 11/22/2015   Decreased creatinine clearance 11/22/2015   Arthritis, degenerative 11/22/2015   Lactose intolerance 11/22/2015   Basal cell carcinoma 11/22/2015   Essential hypertension 06/21/2015   Hyperlipemia 06/21/2015   GERD (gastroesophageal reflux disease) 06/21/2015   Calculus of kidney 11/18/2013    Lewis Moccasin, PT 06/04/2021, 2:49 PM  [place] MAIN [facility] SERVICES [address] [place], [place], [address] Phone: [phone]   Fax:  [phone]  Name: Jose Castro MRN: [id number] Date of Birth: [birth year]

## 2021-06-06 ENCOUNTER — Ambulatory Visit: Payer: Medicare Other | Admitting: Physical Therapy

## 2021-06-06 ENCOUNTER — Other Ambulatory Visit: Payer: Self-pay

## 2021-06-06 DIAGNOSIS — R2681 Unsteadiness on feet: Secondary | ICD-10-CM

## 2021-06-06 DIAGNOSIS — R269 Unspecified abnormalities of gait and mobility: Secondary | ICD-10-CM | POA: Diagnosis not present

## 2021-06-06 DIAGNOSIS — M6281 Muscle weakness (generalized): Secondary | ICD-10-CM

## 2021-06-06 DIAGNOSIS — R262 Difficulty in walking, not elsewhere classified: Secondary | ICD-10-CM

## 2021-06-06 NOTE — Therapy (Signed)
[place] MAIN [facility] SERVICES [address] [place], [place], [address] Phone: [phone]   Fax:  [phone]  Physical Therapy Treatment  Patient Details  Name: Jose Castro MRN: [id number] Date of Birth: 03-07-1926 Referring Provider (PT): Dr. Ancil Boozer   Encounter Date: 06/06/2021   PT End of Session - 06/06/21 1357     Visit Number 39    Number of Visits 73    Date for PT Re-Evaluation 08/05/21    Authorization Type eval: 09/20/20, Progress note 01/02/2021, PN on 02/06/2504, Recert = 3/97/6734-1/93/7902; PN on 4/202022; PN on 4/0/9735; Recert= 3/29/92426-07/04/4195    PT Start Time 1350    PT Stop Time 1430    PT Time Calculation (min) 40 min    Equipment Utilized During Treatment Gait belt    Activity Tolerance Patient tolerated treatment well    Behavior During Therapy WFL for tasks assessed/performed             Past Medical History:  Diagnosis Date   Allergy    Penicillin   Arthritis    Basal cell carcinoma    BPH (benign prostatic hyperplasia)    Chronic kidney disease    had a kidney stone which per family was a cyst that was removed   Dysrhythmia    GERD (gastroesophageal reflux disease)    History of kidney stones    Hyperlipidemia    Hypertension    Left-sided low back pain with left-sided sciatica    Lung mass    Splenic vein thrombosis    Stroke ([place]) 05/22/2019   Thrombosis 12/2015   mural  area and no notation of heart attack    Past Surgical History:  Procedure Laterality Date   APPENDECTOMY     CATARACT EXTRACTION, BILATERAL     CYSTOSCOPY WITH INSERTION OF UROLIFT     ENDOSCOPIC RETROGRADE CHOLANGIOPANCREATOGRAPHY (ERCP) WITH PROPOFOL N/A 05/11/2020   Procedure: ENDOSCOPIC RETROGRADE CHOLANGIOPANCREATOGRAPHY (ERCP) WITH PROPOFOL;  Surgeon: [name], MD;  Location: [facility] ENDOSCOPY;  Service: Endoscopy;  Laterality: N/A;   ERCP N/A 10/23/2020   Procedure: ENDOSCOPIC RETROGRADE CHOLANGIOPANCREATOGRAPHY  (ERCP);  Surgeon: [name], MD;  Location: [facility] ENDOSCOPY;  Service: Endoscopy;  Laterality: N/A;   EYE SURGERY  08/2018   IR EXCHANGE BILIARY DRAIN  05/04/2020   KIDNEY STONE SURGERY     KNEE SURGERY     PACEMAKER INSERTION N/A 07/13/2018   Procedure: INSERTION PACEMAKER-DUEL CHAMBER INITIAL IMPLANT;  Surgeon: Isaias Cowman, MD;  Location: [facility] ORS;  Service: Cardiovascular;  Laterality: N/A;   SPINE SURGERY      There were no vitals filed for this visit.   Subjective Assessment - 06/06/21 1354     Subjective Patient reports feeling okay today. He reports "hurt" in left knee however he does not classify it as pain. He did state it's a 3/10.    Pertinent History Pt referred for deconditioning and gait instability. He has had a decline since he last finished therapy. Pt had a hospital admission from 05/02/20 to 05/14/20 for acute cholecystitis and is now s/p cholecystostomy drain placement. Drain is still in place and pt is waiting to hear when they plan to remove it. Pt discharged to SNF and then returned home. In addition pt underwent radiation therapy for a RUL lung mass/cancer and had his last radiation treatment in September of 2021. He reports that he has continued to walk at home as his primary exercise. Pt was previously seen at this clinic for PT/OT  due to weakness/imbalance s/p CVA. Prior history from 09/21/19: Pt is a 87 year old male who presents with imbalance and difficulty with gait following a right frontal CVA with left hemiparesis on 05/22/19.  He has completed inpatient and home health PT, and now presents for OP PT.  He was discharged from inpatient rehab at a supervision level of assistance, ambulating with a RW.  His family notes that he is impulsive and will try to ambulate without his RW at home.  Family is currently providing 24/7 supervision.    Limitations House hold activities;Walking    How long can you sit comfortably? no limitations    How long can you walk  comfortably? he requires a RW    Patient Stated Goals Pt wants to be able to walk his granddaughter down the aisle at her wedding November 2022    Currently in Pain? Yes    Pain Score 3     Pain Location Knee    Pain Orientation Left    Pain Descriptors / Indicators Aching    Pain Type Chronic pain    Pain Onset More than a month ago             INTERVENTIONS   Nustep, seat 10 and resistance Level 2 x 5 min for warm up.   Heel to toe gait sequencing activity in // bars: instructed patient to drive bilateral knees forward toward target (therapist hand). Patient performed 4 sets of 6 reps each. Focus on increased step length and follow through of LLE.   Forward step over 3 differing height objects in // bars-  x 8 trials. VC on LLE clearing object during follow through.   Side step activity in //bars: Side step with BUE support over orange hurdles - VC to maintain erect posture. Patient able to clear each obstacle well with no loss of balance. `   Seated Dorsiflexion - Resistive with GTB 2 sets of 20 reps BLE- Patient required VC to slow down for proper control.   Sit to stand from chair while holding onto 3lb ball with outstretched hands- Leaning forward and overhead press x 10 reps. Increased difficulty requiring CGA. VC to "power up".   Standing row using matrix cable system - 1 x 20 reps: 7.5# Patient without loss of balance yet required CGA secondary to min unsteadiness.    Education provided throughout session via VC/TC and demonstration to facilitate movement at target joints and correct muscle activation for all exercises performed.     Clinical Impression: Patient responded well to all strengthening exercises and gait activities. Improved response to tactile and visual cues compared to verbal. PT provided pt with goals throughout session to increase stride length and step height of the LLE. Even with cueing, left stride length/height is diminished compared to right in  forward and lateral directions. UE support was proved via // bars throughout session for safety. He fatigued after about 4-5 minutes of standing exercise and would recover with a seated rest break. Pt performed multiple STS throughout session due to rest breaks. Pt will benefit from further skilled PT to improve BLE strength, balance and functional mobility.        PT Short Term Goals - 01/21/21 1438       PT SHORT TERM GOAL #1   Title Pt will be independent with HEP in order to improve strength and balance in order to decrease fall risk and improve function at home.    Baseline 01/02/2021- Patient verbalize understanding of  walking and seated exercises for home program. 01/21/21 patient has been more active and understands HEP.    Time 6    Period Weeks    Status Achieved    Target Date 01/10/21               PT Long Term Goals - 05/13/21 1426       PT LONG TERM GOAL #1   Title Pt will improve BERG by at least 3 points in order to demonstrate clinically significant improvement in balance.    Baseline 09/20/20: 32/56 (Previous discharge: 03/06/20: 46/56), 12/14: 32/56, 1/13: 31/56, 01/16/21 35/56, 02/06/2021=36/56. 02/18/2021= 37/56.03/20/2021- Deferred secondary to left knee aching- will attempt next visit. 05/06/2021= 39/56; 05/13/2021= 36/56 *patient having more left knee pain.    Time 12    Period Weeks    Status Achieved      PT LONG TERM GOAL #2   Title Pt will decrease TUG to below 14 seconds/decrease in order to demonstrate decreased fall risk.    Baseline 09/20/20: 19.2s, 12/14: 17.48, 1/13: 16.65 sec. 01/02/2021=15.75 sec using 4WW, 01/21/21  14.37 seconds. 02/06/2021= 14.92 sec using 4WW. 02/18/2021= 14.5 sec using 4WW. 03/20/2021- Deferred secondary to left knee aching- will attempt next visit. 05/06/2021= 16.32 sec avg using 4WW. 05/13/2021= 16.5 sec with 4WW *increased left knee pain.    Time 12    Period Weeks    Status On-going    Target Date 08/05/21      PT LONG TERM GOAL #3    Title Pt will decrease 5TSTS by at least 3 seconds in order to demonstrate clinically significant improvement in LE strength.    Baseline 09/20/20: 23.1s with minA+1 for strength and balance (Previous discharge 03/06/20: 16.1s), 12/14: 20 sec, 1/13: 22 sec.   01/02/2021= 16.05 sec with light UE support. 01/21/21 01/21/21 37.90 seconds without UE, with UE support 12.93 seconds: 02/06/2021= with 1 UE 17.93 sec. 02/18/2021= 21.33 sec without UE support. 05/06/2021= 15.3 sec with BUE Support and 21. 0 sec without UE support. 05/13/2021=15.0 sec with BUE support; 16 sec without UE support.    Time 12    Period Weeks    Status Achieved      PT LONG TERM GOAL #4   Title Pt will increase his self-selected 10MWT to >0.74 m/s in order to demonstrate improvement in gait speed and improved community ambulation    Baseline 09/20/20: self-selected: 14.4s = 0.69 m/s with rollator (Previous discharge 03/06/20: self-selected: 13.6s = 0.74 m/s), 12/14: 0.75 m/s, 1/13: 0.78 m/s. 01/02/2021= 0.85 m/s using 4WW, 01/16/21 with 4 WW .99 m/s. 02/06/2021= self selected = 0.93 m/s with 1OX    Time 4    Period Weeks    Status Achieved      PT LONG TERM GOAL #5   Title Patient will increase six minute walk test distance to >1000 for progression to community ambulator and improve gait ability    Baseline 01/10/20  620 ft with rollator, 01/21/21  965 ft; 02/06/2021= 910 feet with 4WW.  02/18/2021= 950 feet with 4WW. 05/06/2021= will test next visit. 05/13/2021- Deferred secondary to increased left knee pain. Will keep goal active as patient was making significant progress.    Time 12    Period Weeks    Status On-going    Target Date 08/05/21      Additional Long Term Goals   Additional Long Term Goals Yes      PT LONG TERM GOAL #6   Title Patient will demonstrate  ability to walk with HHA +1 > 200 ft. without loss of balance on level surfaces to simulate walking his grandaughter down the aisle for her upcoming wedding in November 2022.     Baseline 05/13/2021- Patient currently ambulating short community distance with use of 4WW but desires to walk with his grandaughter with HHA for her wedding.    Time 12    Period Weeks    Status New    Target Date 08/05/21      PT LONG TERM GOAL #7   Title Pt will decrease 5TSTS  to 14 sec or less with BUE support on armrest in order to demonstrate clinically significant improvement in LE strength.    Baseline 05/13/2021=5.0 sec with BUE support; 16 sec without UE support.    Time 12    Period Weeks    Status New    Target Date 08/05/21                   Plan - 06/06/21 1734     Clinical Impression Statement Patient responded well to all strengthening exercises and gait activities. Improved response to tactile and visual cues compared to verbal. PT provided pt with goals throughout session to increase stride length and step height of the LLE. Even with cueing, left stride length/height is diminished compared to right in forward and lateral directions. UE support was proved via // bars throughout session for safety. He fatigued after about 4-5 minutes of standing exercise and would recover with a seated rest break. Pt performed multiple STS throughout session due to rest breaks. Pt will benefit from further skilled PT to improve BLE strength, balance and functional mobility.    Personal Factors and Comorbidities Age;Comorbidity 3+    Comorbidities HTN, CVA, cholecystitis with cholecystostomy drain    Examination-Activity Limitations Bend;Lift;Squat;Locomotion Level;Stairs;Stand    Examination-Participation Restrictions Community Activity;Driving;Laundry;Meal Prep;Shop    Stability/Clinical Decision Making Unstable/Unpredictable    Rehab Potential Fair    PT Frequency 2x / week    PT Duration 12 weeks    PT Treatment/Interventions ADLs/Self Care Home Management;Cryotherapy;Electrical Stimulation;Iontophoresis 4mg /ml Dexamethasone;Moist Heat;Traction;Ultrasound;DME Instruction;Gait  training;Stair training;Therapeutic activities;Therapeutic exercise;Functional mobility training;Balance training;Neuromuscular re-education;Patient/family education;Manual techniques;Dry needling;Vestibular;Joint Manipulations;Spinal Manipulations;Aquatic Therapy    PT Next Visit Plan Continue to focus on LE strengthening and balance, dynamic balance tasks    PT Home Exercise Plan no changes    Consulted and Agree with Plan of Care Patient             Patient will benefit from skilled therapeutic intervention in order to improve the following deficits and impairments:  Abnormal gait, Decreased balance, Decreased mobility, Difficulty walking, Decreased knowledge of precautions, Decreased safety awareness, Decreased activity tolerance, Decreased strength, Decreased endurance  Visit Diagnosis: Abnormality of gait and mobility  Difficulty in walking, not elsewhere classified  Muscle weakness (generalized)  Unsteadiness on feet     Problem List Patient Active Problem List   Diagnosis Date Noted   Moderate protein-calorie malnutrition ([place]) 03/11/2021   Hemiparesis affecting left side as late effect of cerebrovascular accident (CVA) ([place]) 03/11/2021   Aneurysm, aorta, thoracic ([place]) 12/21/2020   Calculus of bile duct without cholecystitis and without obstruction    Non-small cell carcinoma of lung, right ([place]) 09/07/2020   Mass of upper lobe of right lung 07/13/2020   Calculus of bile duct with cholecystitis without obstruction    Abnormal findings on imaging of biliary tract    Acute cholecystitis 05/02/2020   Chronic venous insufficiency 12/05/2019  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    CKD (chronic kidney disease), stage II    New onset atrial fibrillation (HCC)    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) 03/03/2016   Gallstone 12/31/2015   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   Atherosclerosis of aorta ([place]) 11/22/2015   Carotid artery narrowing 11/22/2015   Diverticulosis of colon 11/22/2015   Decreased creatinine clearance 11/22/2015   Arthritis, degenerative 11/22/2015   Lactose intolerance 11/22/2015   Basal cell carcinoma 11/22/2015   Essential hypertension 06/21/2015   Hyperlipemia 06/21/2015   GERD (gastroesophageal reflux disease) 06/21/2015   Calculus of kidney 11/18/2013   Patrina Levering PT, DPT  [facility] 06/06/2021, 5:45 PM  [place] MAIN [facility] SERVICES [address] [place], [place], [address] Phone: [phone]   Fax:  [phone]  Name: Jose Castro MRN: [id number] Date of Birth: 1926/02/19

## 2021-06-11 ENCOUNTER — Ambulatory Visit: Payer: Medicare Other | Admitting: Physical Therapy

## 2021-06-11 ENCOUNTER — Other Ambulatory Visit: Payer: Self-pay

## 2021-06-11 DIAGNOSIS — R269 Unspecified abnormalities of gait and mobility: Secondary | ICD-10-CM

## 2021-06-11 DIAGNOSIS — M6281 Muscle weakness (generalized): Secondary | ICD-10-CM

## 2021-06-11 DIAGNOSIS — R2681 Unsteadiness on feet: Secondary | ICD-10-CM

## 2021-06-11 DIAGNOSIS — R262 Difficulty in walking, not elsewhere classified: Secondary | ICD-10-CM

## 2021-06-11 DIAGNOSIS — I63511 Cerebral infarction due to unspecified occlusion or stenosis of right middle cerebral artery: Secondary | ICD-10-CM

## 2021-06-11 NOTE — Therapy (Signed)
[place] [facility]IN [facility] SERVICES [address] [place], [place], [address] Phone: [phone]   Fax:  [phone]  Physical Therapy Treatment/Progress Note  Patient Details  Name: Jose Castro MRN: [id number] Date of Birth: September 01, 1926 Referring Provider (PT): Dr. Ancil Boozer   Encounter Date: 06/11/2021   PT End of Session - 06/11/21 1353     Visit Number 40    Number of Visits 63    Date for PT Re-Evaluation 08/05/21    Authorization Type eval: 09/20/20, Progress note 01/02/2021, PN on 01/29/4969, Recert = 2/63/7858-8/50/2774; PN on 4/202022; PN on 12/02/8784; Recert= 7/67/20947-0/08/6282; PN on 06/11/21;    PT Start Time 1345    PT Stop Time 1430    PT Time Calculation (min) 45 min    Equipment Utilized During Treatment Gait belt    Activity Tolerance Patient tolerated treatment well    Behavior During Therapy WFL for tasks assessed/performed             Past Medical History:  Diagnosis Date   Allergy    Penicillin   Arthritis    Basal cell carcinoma    BPH (benign prostatic hyperplasia)    Chronic kidney disease    had a kidney stone which per family was a cyst that was removed   Dysrhythmia    GERD (gastroesophageal reflux disease)    History of kidney stones    Hyperlipidemia    Hypertension    Left-sided low back pain with left-sided sciatica    Lung mass    Splenic vein thrombosis    Stroke ([place]) 05/22/2019   Thrombosis 12/2015   mural  area and no notation of heart attack    Past Surgical History:  Procedure Laterality Date   APPENDECTOMY     CATARACT EXTRACTION, BILATERAL     CYSTOSCOPY WITH INSERTION OF UROLIFT     ENDOSCOPIC RETROGRADE CHOLANGIOPANCREATOGRAPHY (ERCP) WITH PROPOFOL N/A 05/11/2020   Procedure: ENDOSCOPIC RETROGRADE CHOLANGIOPANCREATOGRAPHY (ERCP) WITH PROPOFOL;  Surgeon: [name], MD;  Location: [facility] ENDOSCOPY;  Service: Endoscopy;  Laterality: N/A;   ERCP N/A 10/23/2020   Procedure: ENDOSCOPIC RETROGRADE  CHOLANGIOPANCREATOGRAPHY (ERCP);  Surgeon: [name], MD;  Location: [facility] ENDOSCOPY;  Service: Endoscopy;  Laterality: N/A;   EYE SURGERY  08/2018   IR EXCHANGE BILIARY DRAIN  05/04/2020   KIDNEY STONE SURGERY     KNEE SURGERY     PACEMAKER INSERTION N/A 07/13/2018   Procedure: INSERTION PACEMAKER-DUEL CHAMBER INITIAL IMPLANT;  Surgeon: Isaias Cowman, MD;  Location: [facility] ORS;  Service: Cardiovascular;  Laterality: N/A;   SPINE SURGERY      There were no vitals filed for this visit.   Subjective Assessment - 06/11/21 1352     Subjective Patient states he is "okie dok" today. He reports "hurt" in left knee however states it is not pain. Pt states he is going to receive corticosteroid injection to L knee on Thursday after PT session.    Patient is accompained by: Family member    Pertinent History Pt referred for deconditioning and gait instability. He has had a decline since he last finished therapy. Pt had a hospital admission from 05/02/20 to 05/14/20 for acute cholecystitis and is now s/p cholecystostomy drain placement. Drain is still in place and pt is waiting to hear when they plan to remove it. Pt discharged to SNF and then returned home. In addition pt underwent radiation therapy for a RUL lung mass/cancer and had his last radiation treatment in September of  2021. He reports that he has continued to walk at home as his primary exercise. Pt was previously seen at this clinic for PT/OT due to weakness/imbalance s/p CVA. Prior history from 09/21/19: Pt is a 87 year old male who presents with imbalance and difficulty with gait following a right frontal CVA with left hemiparesis on 05/22/19.  He has completed inpatient and home health PT, and now presents for OP PT.  He was discharged from inpatient rehab at a supervision level of assistance, ambulating with a RW.  His family notes that he is impulsive and will try to ambulate without his RW at home.  Family is currently providing 24/7  supervision.    Limitations House hold activities;Walking    How long can you sit comfortably? no limitations    How long can you walk comfortably? he requires a RW    Patient Stated Goals Pt wants to be able to walk his granddaughter down the aisle at her wedding November 2022    Currently in Pain? No/denies    Pain Onset More than a month ago               INTERVENTIONS   Outcome Measures/Goals TUG: 20.11 seconds 5xSTS: 14.61 seconds 6MWT: 777ft HHA x1 271ft: 4 steps, unsafe  TherEx Nustep, LE only, seat 10 and resistance Level 2-4 x 5 min for warm up.  Sit to stand from chair while holding onto 3lb ball with outstretched hands- Leaning forward and overhead press x 10 reps. Increased difficulty requiring MIN A on 3 occasions to assist lifting to stand.   Standing row using matrix cable system - 2 x 10 reps: 12.5# Patient without loss of balance yet required CGA for  unsteadiness.    Education provided throughout session via VC/TC and demonstration to facilitate movement at target joints and correct muscle activation for all exercises performed.      Clinical Impression: Outcome measures were reassessed for progress note today. Pt demo improved power and strength on 5xSTS. Endurance, gait speed and coordination have not improved demonstrated by less distance covered during 6MWT and increased time to complete TUG. However, he was able to complete these tests whereas he was not able to complete these tests on his last progress note due to L knee pain. Significantly increased time to complete 180* turn during both tests. He demo decreased left foot clearance during 6MWT with VC for safety. Ambulation with HHA x1 was attempted however deemed unsafe after 5 steps. Will need to continue progressing balance and ambulation activities before attempting HHA x1 again. PT plans to practice with HHA x2 before progressing to [facility] x1. PT curious to see if gait mechanics and ambulation distance  improve after corticosteroid injection on Thursday. Pt completed strengthening therex for remainder of session with increased assist required during STS activity due to fatigue after 6MWT. Pt will benefit from further skilled PT to improve BLE strength, balance and functional mobility.  Patient's condition has the potential to improve in response to therapy. Maximum improvement is yet to be obtained. The anticipated improvement is attainable and reasonable in a generally predictable time.          PT Short Term Goals - 01/21/21 1438       PT SHORT TERM GOAL #1   Title Pt will be independent with HEP in order to improve strength and balance in order to decrease fall risk and improve function at home.    Baseline 01/02/2021- Patient verbalize understanding of walking and seated  exercises for home program. 01/21/21 patient has been more active and understands HEP.    Time 6    Period Weeks    Status Achieved    Target Date 01/10/21               PT Long Term Goals - 06/11/21 1458       PT LONG TERM GOAL #1   Title Pt will improve BERG by at least 3 points in order to demonstrate clinically significant improvement in balance.    Baseline 09/20/20: 32/56 (Previous discharge: 03/06/20: 46/56), 12/14: 32/56, 1/13: 31/56, 01/16/21 35/56, 02/06/2021=36/56. 02/18/2021= 37/56.03/20/2021- Deferred secondary to left knee aching- will attempt next visit. 05/06/2021= 39/56; 05/13/2021= 36/56 *patient having more left knee pain.    Time 12    Period Weeks    Status Achieved      PT LONG TERM GOAL #2   Title Pt will decrease TUG to below 14 seconds/decrease in order to demonstrate decreased fall risk.    Baseline 09/20/20: 19.2s, 12/14: 17.48, 1/13: 16.65 sec. 01/02/2021=15.75 sec using 4WW, 01/21/21  14.37 seconds. 02/06/2021= 14.92 sec using 4WW. 02/18/2021= 14.5 sec using 4WW. 03/20/2021- Deferred secondary to left knee aching- will attempt next visit. 05/06/2021= 16.32 sec avg using 4WW. 05/13/2021= 16.5 sec  with 4WW *increased left knee pain. 06/11/21: 20.11 seconds (increased time to turn) using 4WW;    Time 12    Period Weeks    Status On-going    Target Date 09/03/21      PT LONG TERM GOAL #3   Title Pt will decrease 5TSTS by at least 3 seconds in order to demonstrate clinically significant improvement in LE strength.    Baseline 09/20/20: 23.1s with minA+1 for strength and balance (Previous discharge 03/06/20: 16.1s), 12/14: 20 sec, 1/13: 22 sec.   01/02/2021= 16.05 sec with light UE support. 01/21/21 01/21/21 37.90 seconds without UE, with UE support 12.93 seconds: 02/06/2021= with 1 UE 17.93 sec. 02/18/2021= 21.33 sec without UE support. 05/06/2021= 15.3 sec with BUE Support and 21. 0 sec without UE support. 05/13/2021=15.0 sec with BUE support; 16 sec without UE support.    Time 12    Period Weeks    Status Achieved      PT LONG TERM GOAL #4   Title Pt will increase his self-selected 10MWT to >0.74 m/s in order to demonstrate improvement in gait speed and improved community ambulation    Baseline 09/20/20: self-selected: 14.4s = 0.69 m/s with rollator (Previous discharge 03/06/20: self-selected: 13.6s = 0.74 m/s), 12/14: 0.75 m/s, 1/13: 0.78 m/s. 01/02/2021= 0.85 m/s using 4WW, 01/16/21 with 4 WW .99 m/s. 02/06/2021= self selected = 0.93 m/s with 1OF    Time 4    Period Weeks    Status Achieved      PT LONG TERM GOAL #5   Title Patient will increase six minute walk test distance to >1000 for progression to community ambulator and improve gait ability    Baseline 01/10/20  620 ft with rollator, 01/21/21  965 ft; 02/06/2021= 910 feet with 4WW.  02/18/2021= 950 feet with 4WW. 05/06/2021= will test next visit. 05/13/2021- Deferred secondary to increased left knee pain. 06/11/21: 753ft with 7PZ *knee pain throughout test.    Time 12    Period Weeks    Status On-going    Target Date 09/03/21      PT LONG TERM GOAL #6   Title Patient will demonstrate ability to walk with HHA +1 > 200 ft. without loss of  balance on  level surfaces to simulate walking his grandaughter down the aisle for her upcoming wedding in November 2022.    Baseline 05/13/2021- Patient currently ambulating short community distance with use of 4WW but desires to walk with his grandaughter with HHA for her wedding. 06/11/21: attempted 5 steps with HHA x1, deemed unsafe.    Time 12    Period Weeks    Status On-going    Target Date 09/03/21      PT LONG TERM GOAL #7   Title Pt will decrease 5TSTS  to 14 sec or less with BUE support on armrest in order to demonstrate clinically significant improvement in LE strength.    Baseline 05/13/2021=5.0 sec with BUE support; 16 sec without UE support. 06/11/21: 14.61 seconds with BUE support.    Time 12    Period Weeks    Status On-going    Target Date 09/03/21                   Plan - 06/11/21 1457     Clinical Impression Statement Outcome measures were reassessed for progress note today. Pt demo improved power and strength on 5xSTS. Endurance, gait speed and coordination have not improved demonstrated by less distance covered during 6MWT and increased time to complete TUG. However, he was able to complete these tests whereas he was not able to complete these tests on his last progress note due to L knee pain. Significantly increased time to complete 180* turn during both tests. He demo decreased left foot clearance during 6MWT with VC for safety. Ambulation with HHA x1 was attempted however deemed unsafe after 5 steps. Will need to continue progressing balance and ambulation activities before attempting HHA x1 again. PT plans to practice with HHA x2 before progressing to [facility] x1. PT curious to see if gait mechanics and ambulation distance improve after corticosteroid injection on Thursday. Pt completed strengthening therex for remainder of session with increased assist required during STS activity due to fatigue after 6MWT. Pt will benefit from further skilled PT to improve BLE strength, balance  and functional mobility. Patient's condition has the potential to improve in response to therapy. Maximum improvement is yet to be obtained. The anticipated improvement is attainable and reasonable in a generally predictable time.    Personal Factors and Comorbidities Age;Comorbidity 3+    Comorbidities HTN, CVA, cholecystitis with cholecystostomy drain    Examination-Activity Limitations Bend;Lift;Squat;Locomotion Level;Stairs;Stand    Examination-Participation Restrictions Community Activity;Driving;Laundry;Meal Prep;Shop    Stability/Clinical Decision Making Unstable/Unpredictable    Rehab Potential Fair    PT Frequency 2x / week    PT Duration 12 weeks    PT Treatment/Interventions ADLs/Self Care Home Management;Cryotherapy;Electrical Stimulation;Iontophoresis 4mg /ml Dexamethasone;Moist Heat;Traction;Ultrasound;DME Instruction;Gait training;Stair training;Therapeutic activities;Therapeutic exercise;Functional mobility training;Balance training;Neuromuscular re-education;Patient/family education;Manual techniques;Dry needling;Vestibular;Joint Manipulations;Spinal Manipulations;Aquatic Therapy    PT Next Visit Plan Continue to focus on LE strengthening and balance, dynamic balance tasks    PT Home Exercise Plan no changes    Consulted and Agree with Plan of Care Patient             Patient will benefit from skilled therapeutic intervention in order to improve the following deficits and impairments:  Abnormal gait, Decreased balance, Decreased mobility, Difficulty walking, Decreased knowledge of precautions, Decreased safety awareness, Decreased activity tolerance, Decreased strength, Decreased endurance  Visit Diagnosis: Abnormality of gait and mobility  Difficulty in walking, not elsewhere classified  Muscle weakness (generalized)  Unsteadiness on feet  Right middle cerebral artery stroke (HCC)  Problem List Patient Active Problem List   Diagnosis Date Noted   Moderate  protein-calorie malnutrition ([place]) 03/11/2021   Hemiparesis affecting left side as late effect of cerebrovascular accident (CVA) ([place]) 03/11/2021   Aneurysm, aorta, thoracic ([place]) 12/21/2020   Calculus of bile duct without cholecystitis and without obstruction    Non-small cell carcinoma of lung, right ([place]) 09/07/2020   Mass of upper lobe of right lung 07/13/2020   Calculus of bile duct with cholecystitis without obstruction    Abnormal findings on imaging of biliary tract    Acute cholecystitis 05/02/2020   Chronic venous insufficiency 12/05/2019   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    CKD (chronic kidney disease), stage II    New onset atrial fibrillation ([place])    Right middle cerebral artery stroke ([place]) 05/27/2019   Goals of care, counseling/discussion    Palliative care by specialist    DNR (do not resuscitate) discussion    History of CVA (cerebrovascular accident) 05/22/2019   Persistent proteinuria 07/30/2018   Mobitz type 2 second degree heart block 07/13/2018   Chronic kidney disease, stage III (moderate) ([place]) 06/15/2018   Leg pain 05/06/2018   Lymphedema 05/06/2018   Coagulopathy ([place]) 07/07/2017   Lumbar spondylosis 03/10/2017   Elevated uric acid in blood 11/20/2016   BPH (benign prostatic hyperplasia) 05/29/2016   Peripheral vascular disease of lower extremity ([place]) 03/03/2016   Gallstone 12/31/2015   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   Atherosclerosis of aorta ([place]) 11/22/2015   Carotid artery narrowing 11/22/2015   Diverticulosis of colon 11/22/2015   Decreased creatinine clearance 11/22/2015   Arthritis, degenerative 11/22/2015   Lactose intolerance 11/22/2015   Basal cell carcinoma 11/22/2015   Essential hypertension 06/21/2015   Hyperlipemia 06/21/2015   GERD (gastroesophageal reflux disease) 06/21/2015   Calculus of kidney 11/18/2013   Patrina Levering PT, DPT  [facility] 06/11/2021, 3:05 PM  [place] [facility]IN [facility] SERVICES [address] [place], [place], [address] Phone: [phone]   Fax:  [phone]  Name: TAYLEN OSORTO MRN: [id number] Date of Birth: 1926/02/10

## 2021-06-13 ENCOUNTER — Other Ambulatory Visit: Payer: Self-pay

## 2021-06-13 ENCOUNTER — Ambulatory Visit: Payer: Medicare Other | Admitting: Physical Therapy

## 2021-06-13 DIAGNOSIS — M6281 Muscle weakness (generalized): Secondary | ICD-10-CM

## 2021-06-13 DIAGNOSIS — R29898 Other symptoms and signs involving the musculoskeletal system: Secondary | ICD-10-CM

## 2021-06-13 DIAGNOSIS — R2681 Unsteadiness on feet: Secondary | ICD-10-CM

## 2021-06-13 DIAGNOSIS — R262 Difficulty in walking, not elsewhere classified: Secondary | ICD-10-CM

## 2021-06-13 DIAGNOSIS — I63511 Cerebral infarction due to unspecified occlusion or stenosis of right middle cerebral artery: Secondary | ICD-10-CM

## 2021-06-13 DIAGNOSIS — R269 Unspecified abnormalities of gait and mobility: Secondary | ICD-10-CM | POA: Diagnosis not present

## 2021-06-13 DIAGNOSIS — M170 Bilateral primary osteoarthritis of knee: Secondary | ICD-10-CM | POA: Insufficient documentation

## 2021-06-13 NOTE — Therapy (Signed)
[place] MAIN [facility] SERVICES [address] [place], [place], [address] Phone: [phone]   Fax:  [phone]  Physical Therapy Treatment  Patient Details  Name: Jose Castro MRN: [id number] Date of Birth: 23-Sep-1926 Referring Provider (PT): Dr. Ancil Boozer   Encounter Date: 06/13/2021   PT End of Session - 06/13/21 1351     Visit Number 41    Number of Visits 24    Date for PT Re-Evaluation 08/05/21    Authorization Type eval: 09/20/20, Progress note 01/02/2021, PN on 07/05/276, Recert = 03/12/8785-7/67/2094; PN on 4/202022; PN on 7/0/9628; Recert= 3/66/29476-04/03/6502; PN on 06/11/21;    PT Start Time 1345    PT Stop Time 1420    PT Time Calculation (min) 35 min    Equipment Utilized During Treatment Gait belt    Activity Tolerance Patient tolerated treatment well    Behavior During Therapy WFL for tasks assessed/performed             Past Medical History:  Diagnosis Date   Allergy    Penicillin   Arthritis    Basal cell carcinoma    BPH (benign prostatic hyperplasia)    Chronic kidney disease    had a kidney stone which per family was a cyst that was removed   Dysrhythmia    GERD (gastroesophageal reflux disease)    History of kidney stones    Hyperlipidemia    Hypertension    Left-sided low back pain with left-sided sciatica    Lung mass    Splenic vein thrombosis    Stroke ([place]) 05/22/2019   Thrombosis 12/2015   mural  area and no notation of heart attack    Past Surgical History:  Procedure Laterality Date   APPENDECTOMY     CATARACT EXTRACTION, BILATERAL     CYSTOSCOPY WITH INSERTION OF UROLIFT     ENDOSCOPIC RETROGRADE CHOLANGIOPANCREATOGRAPHY (ERCP) WITH PROPOFOL N/A 05/11/2020   Procedure: ENDOSCOPIC RETROGRADE CHOLANGIOPANCREATOGRAPHY (ERCP) WITH PROPOFOL;  Surgeon: [name], MD;  Location: [facility] ENDOSCOPY;  Service: Endoscopy;  Laterality: N/A;   ERCP N/A 10/23/2020   Procedure: ENDOSCOPIC RETROGRADE  CHOLANGIOPANCREATOGRAPHY (ERCP);  Surgeon: [name], MD;  Location: [facility] ENDOSCOPY;  Service: Endoscopy;  Laterality: N/A;   EYE SURGERY  08/2018   IR EXCHANGE BILIARY DRAIN  05/04/2020   KIDNEY STONE SURGERY     KNEE SURGERY     PACEMAKER INSERTION N/A 07/13/2018   Procedure: INSERTION PACEMAKER-DUEL CHAMBER INITIAL IMPLANT;  Surgeon: Isaias Cowman, MD;  Location: [facility] ORS;  Service: Cardiovascular;  Laterality: N/A;   SPINE SURGERY      There were no vitals filed for this visit.   Subjective Assessment - 06/13/21 1349     Subjective Patient states he is good today. He reports "hurt" in left knee, rating it a 4/10. Pt states he is going to receive corticosteroid injection to L knee today after PT session.    Patient is accompained by: Family member    Pertinent History Pt referred for deconditioning and gait instability. He has had a decline since he last finished therapy. Pt had a hospital admission from 05/02/20 to 05/14/20 for acute cholecystitis and is now s/p cholecystostomy drain placement. Drain is still in place and pt is waiting to hear when they plan to remove it. Pt discharged to SNF and then returned home. In addition pt underwent radiation therapy for a RUL lung mass/cancer and had his last radiation treatment in September of 2021. He reports that he  has continued to walk at home as his primary exercise. Pt was previously seen at this clinic for PT/OT due to weakness/imbalance s/p CVA. Prior history from 09/21/19: Pt is a 87 year old male who presents with imbalance and difficulty with gait following a right frontal CVA with left hemiparesis on 05/22/19.  He has completed inpatient and home health PT, and now presents for OP PT.  He was discharged from inpatient rehab at a supervision level of assistance, ambulating with a RW.  His family notes that he is impulsive and will try to ambulate without his RW at home.  Family is currently providing 24/7 supervision.    Limitations House  hold activities;Walking    How long can you sit comfortably? no limitations    How long can you walk comfortably? he requires a RW    Patient Stated Goals Pt wants to be able to walk his granddaughter down the aisle at her wedding November 2022    Currently in Pain? Yes    Pain Score 4     Pain Location Knee    Pain Orientation Left    Pain Descriptors / Indicators Aching    Pain Type Chronic pain    Pain Onset More than a month ago             INTERVENTIONS  TherEx Nustep, LE only, seat 10 and resistance Level 1-3 x 5 min for warm up.    Performed in // bars: Standing marches 2 x 15 BLE with UE support; Side steps 2 x 8 steps BLE; Sit to stand from standard height chair with no UE support;  2 x 10, some difficulty requiring MIN A to correct posterior lean on multiple occasions; Seated toe taps to 4 stacked cones 2 x 15 BLE; Side steps with YRB around ankles 2 x 8 steps BLE; Standing hip extension YRB 2 x 10 BLE with UE support;     Education provided throughout session via VC/TC and demonstration to facilitate movement at target joints and correct muscle activation for all exercises performed.      Clinical Impression: Pt arrives with excellent motivation to today's session. Today's session incorporated exercises focusing on hip flexion strength to promote increased left foot clearance during ambulation. Therapeutic exercises were progressed with additional sets and reps. Seated rest breaks provided intermittently due to fatigue and SOB after each set of STS. Side steps were also progressed by adding YTB. Pt asked to end session 10 minutes early in order to arrive to his next doctors appointment on time. Pt will benefit from further skilled PT to improve BLE strength, balance and functional mobility.             PT Short Term Goals - 01/21/21 1438       PT SHORT TERM GOAL #1   Title Pt will be independent with HEP in order to improve strength and balance in order to  decrease fall risk and improve function at home.    Baseline 01/02/2021- Patient verbalize understanding of walking and seated exercises for home program. 01/21/21 patient has been more active and understands HEP.    Time 6    Period Weeks    Status Achieved    Target Date 01/10/21               PT Long Term Goals - 06/11/21 1458       PT LONG TERM GOAL #1   Title Pt will improve BERG by at least 3 points in  order to demonstrate clinically significant improvement in balance.    Baseline 09/20/20: 32/56 (Previous discharge: 03/06/20: 46/56), 12/14: 32/56, 1/13: 31/56, 01/16/21 35/56, 02/06/2021=36/56. 02/18/2021= 37/56.03/20/2021- Deferred secondary to left knee aching- will attempt next visit. 05/06/2021= 39/56; 05/13/2021= 36/56 *patient having more left knee pain.    Time 12    Period Weeks    Status Achieved      PT LONG TERM GOAL #2   Title Pt will decrease TUG to below 14 seconds/decrease in order to demonstrate decreased fall risk.    Baseline 09/20/20: 19.2s, 12/14: 17.48, 1/13: 16.65 sec. 01/02/2021=15.75 sec using 4WW, 01/21/21  14.37 seconds. 02/06/2021= 14.92 sec using 4WW. 02/18/2021= 14.5 sec using 4WW. 03/20/2021- Deferred secondary to left knee aching- will attempt next visit. 05/06/2021= 16.32 sec avg using 4WW. 05/13/2021= 16.5 sec with 4WW *increased left knee pain. 06/11/21: 20.11 seconds (increased time to turn) using 4WW;    Time 12    Period Weeks    Status On-going    Target Date 08/05/21      PT LONG TERM GOAL #3   Title Pt will decrease 5TSTS by at least 3 seconds in order to demonstrate clinically significant improvement in LE strength.    Baseline 09/20/20: 23.1s with minA+1 for strength and balance (Previous discharge 03/06/20: 16.1s), 12/14: 20 sec, 1/13: 22 sec.   01/02/2021= 16.05 sec with light UE support. 01/21/21 01/21/21 37.90 seconds without UE, with UE support 12.93 seconds: 02/06/2021= with 1 UE 17.93 sec. 02/18/2021= 21.33 sec without UE support. 05/06/2021= 15.3 sec with  BUE Support and 21. 0 sec without UE support. 05/13/2021=15.0 sec with BUE support; 16 sec without UE support.    Time 12    Period Weeks    Status Achieved      PT LONG TERM GOAL #4   Title Pt will increase his self-selected 10MWT to >0.74 m/s in order to demonstrate improvement in gait speed and improved community ambulation    Baseline 09/20/20: self-selected: 14.4s = 0.69 m/s with rollator (Previous discharge 03/06/20: self-selected: 13.6s = 0.74 m/s), 12/14: 0.75 m/s, 1/13: 0.78 m/s. 01/02/2021= 0.85 m/s using 4WW, 01/16/21 with 4 WW .99 m/s. 02/06/2021= self selected = 0.93 m/s with 4SF    Time 4    Period Weeks    Status Achieved      PT LONG TERM GOAL #5   Title Patient will increase six minute walk test distance to >1000 for progression to community ambulator and improve gait ability    Baseline 01/10/20  620 ft with rollator, 01/21/21  965 ft; 02/06/2021= 910 feet with 4WW.  02/18/2021= 950 feet with 4WW. 05/06/2021= will test next visit. 05/13/2021- Deferred secondary to increased left knee pain. 06/11/21: 733ft with 6CL *knee pain throughout test.    Time 12    Period Weeks    Status On-going    Target Date 08/05/21      PT LONG TERM GOAL #6   Title Patient will demonstrate ability to walk with HHA +1 > 200 ft. without loss of balance on level surfaces to simulate walking his grandaughter down the aisle for her upcoming wedding in November 2022.    Baseline 05/13/2021- Patient currently ambulating short community distance with use of 4WW but desires to walk with his grandaughter with HHA for her wedding. 06/11/21: attempted 5 steps with HHA x1, deemed unsafe.    Time 12    Period Weeks    Status On-going    Target Date 08/05/21  PT LONG TERM GOAL #7   Title Pt will decrease 5TSTS  to 14 sec or less with BUE support on armrest in order to demonstrate clinically significant improvement in LE strength.    Baseline 05/13/2021=5.0 sec with BUE support; 16 sec without UE support. 06/11/21: 14.61  seconds with BUE support.    Time 12    Period Weeks    Status On-going    Target Date 08/05/21                   Plan - 06/13/21 1541     Clinical Impression Statement Pt arrives with excellent motivation to today's session. Today's session incorporated exercises focusing on hip flexion strength to promote increased left foot clearance during ambulation. Therapeutic exercises were progressed with additional sets and reps. Seated rest breaks provided intermittently due to fatigue and SOB after each set of STS. Side steps were also progressed by adding YTB. Pt asked to end session 10 minutes early in order to arrive to his next doctors appointment on time. Pt will benefit from further skilled PT to improve BLE strength, balance and functional mobility.    Personal Factors and Comorbidities Age;Comorbidity 3+    Comorbidities HTN, CVA, cholecystitis with cholecystostomy drain    Examination-Activity Limitations Bend;Lift;Squat;Locomotion Level;Stairs;Stand    Examination-Participation Restrictions Community Activity;Driving;Laundry;Meal Prep;Shop    Stability/Clinical Decision Making Unstable/Unpredictable    Rehab Potential Fair    PT Frequency 2x / week    PT Duration 12 weeks    PT Treatment/Interventions ADLs/Self Care Home Management;Cryotherapy;Electrical Stimulation;Iontophoresis 4mg /ml Dexamethasone;Moist Heat;Traction;Ultrasound;DME Instruction;Gait training;Stair training;Therapeutic activities;Therapeutic exercise;Functional mobility training;Balance training;Neuromuscular re-education;Patient/family education;Manual techniques;Dry needling;Vestibular;Joint Manipulations;Spinal Manipulations;Aquatic Therapy    PT Next Visit Plan Continue to focus on LE strengthening and balance, dynamic balance tasks    PT Home Exercise Plan no changes    Consulted and Agree with Plan of Care Patient             Patient will benefit from skilled therapeutic intervention in order to  improve the following deficits and impairments:  Abnormal gait, Decreased balance, Decreased mobility, Difficulty walking, Decreased knowledge of precautions, Decreased safety awareness, Decreased activity tolerance, Decreased strength, Decreased endurance  Visit Diagnosis: Abnormality of gait and mobility  Difficulty in walking, not elsewhere classified  Muscle weakness (generalized)  Unsteadiness on feet  Right middle cerebral artery stroke (HCC)  Decreased strength of lower extremity     Problem List Patient Active Problem List   Diagnosis Date Noted   Moderate protein-calorie malnutrition ([place]) 03/11/2021   Hemiparesis affecting left side as late effect of cerebrovascular accident (CVA) ([place]) 03/11/2021   Aneurysm, aorta, thoracic ([place]) 12/21/2020   Calculus of bile duct without cholecystitis and without obstruction    Non-small cell carcinoma of lung, right ([place]) 09/07/2020   Mass of upper lobe of right lung 07/13/2020   Calculus of bile duct with cholecystitis without obstruction    Abnormal findings on imaging of biliary tract    Acute cholecystitis 05/02/2020   Chronic venous insufficiency 12/05/2019   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    CKD (chronic kidney disease), stage II    New onset atrial fibrillation (HCC)    Right middle cerebral artery stroke ([place]) 05/27/2019   Goals of care, counseling/discussion    Palliative care by specialist    DNR (do not resuscitate) discussion    History of CVA (cerebrovascular accident) 05/22/2019   Persistent proteinuria 07/30/2018   M[place]itz type 2 second degree heart block  07/13/2018   Chronic kidney disease, stage III (moderate) (HCC) 06/15/2018   Leg pain 05/06/2018   Lymphedema 05/06/2018   Coagulopathy ([place]) 07/07/2017   Lumbar spondylosis 03/10/2017   Elevated uric acid in blood 11/20/2016   BPH (benign prostatic hyperplasia) 05/29/2016   Peripheral vascular disease of lower extremity ([place]) 03/03/2016    Gallstone 12/31/2015   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   Atherosclerosis of aorta ([place]) 11/22/2015   Carotid artery narrowing 11/22/2015   Diverticulosis of colon 11/22/2015   Decreased creatinine clearance 11/22/2015   Arthritis, degenerative 11/22/2015   Lactose intolerance 11/22/2015   Basal cell carcinoma 11/22/2015   Essential hypertension 06/21/2015   Hyperlipemia 06/21/2015   GERD (gastroesophageal reflux disease) 06/21/2015   Calculus of kidney 11/18/2013    Patrina Levering PT, DPT  [facility] 06/13/2021, 3:42 PM  [place] MAIN [facility] SERVICES [address] [place], [place], [address] Phone: [phone]   Fax:  [phone]  Name: Jose Castro MRN: [id number] Date of Birth: 05-30-1926

## 2021-06-18 ENCOUNTER — Ambulatory Visit: Payer: Medicare Other

## 2021-06-18 ENCOUNTER — Other Ambulatory Visit: Payer: Self-pay

## 2021-06-18 DIAGNOSIS — M6281 Muscle weakness (generalized): Secondary | ICD-10-CM

## 2021-06-18 DIAGNOSIS — R278 Other lack of coordination: Secondary | ICD-10-CM

## 2021-06-18 DIAGNOSIS — R269 Unspecified abnormalities of gait and mobility: Secondary | ICD-10-CM | POA: Diagnosis not present

## 2021-06-18 DIAGNOSIS — R262 Difficulty in walking, not elsewhere classified: Secondary | ICD-10-CM

## 2021-06-18 NOTE — Therapy (Signed)
[place] [facility] [address] [place], [place], [address] Phone: [phone]   Fax:  [phone]  Physical Therapy Treatment  Patient Details  Name: Jose Castro MRN: [id number] Date of Birth: 1926/02/25 Referring Provider (PT): Dr. Ancil Boozer   Encounter Date: 06/18/2021   PT End of Session - 06/18/21 1521     Visit Number 42    Number of Visits 67    Date for PT Re-Evaluation 08/05/21    Authorization Type eval: 09/20/20, Progress note 01/02/2021, PN on 0/01/7252, Recert = 6/64/4034-7/42/5956; PN on 4/202022; PN on 02/06/7563; Recert= 3/32/95188-03/01/6605; PN on 06/11/21;    PT Start Time 1346    PT Stop Time 1430    PT Time Calculation (min) 44 min    Equipment Utilized During Treatment Gait belt    Activity Tolerance Patient tolerated treatment well    Behavior During Therapy WFL for tasks assessed/performed             Past Medical History:  Diagnosis Date   Allergy    Penicillin   Arthritis    Basal cell carcinoma    BPH (benign prostatic hyperplasia)    Chronic kidney disease    had a kidney stone which per family was a cyst that was removed   Dysrhythmia    GERD (gastroesophageal reflux disease)    History of kidney stones    Hyperlipidemia    Hypertension    Left-sided low back pain with left-sided sciatica    Lung mass    Splenic vein thrombosis    Stroke ([place]) 05/22/2019   Thrombosis 12/2015   mural  area and no notation of heart attack    Past Surgical History:  Procedure Laterality Date   APPENDECTOMY     CATARACT EXTRACTION, BILATERAL     CYSTOSCOPY WITH INSERTION OF UROLIFT     ENDOSCOPIC RETROGRADE CHOLANGIOPANCREATOGRAPHY (ERCP) WITH PROPOFOL N/A 05/11/2020   Procedure: ENDOSCOPIC RETROGRADE CHOLANGIOPANCREATOGRAPHY (ERCP) WITH PROPOFOL;  Surgeon: [name], MD;  Location: [facility] ENDOSCOPY;  Service: Endoscopy;  Laterality: N/A;   ERCP N/A 10/23/2020   Procedure: ENDOSCOPIC RETROGRADE  CHOLANGIOPANCREATOGRAPHY (ERCP);  Surgeon: [name], MD;  Location: [facility] ENDOSCOPY;  Service: Endoscopy;  Laterality: N/A;   EYE SURGERY  08/2018   IR EXCHANGE BILIARY DRAIN  05/04/2020   KIDNEY STONE SURGERY     KNEE SURGERY     PACEMAKER INSERTION N/A 07/13/2018   Procedure: INSERTION PACEMAKER-DUEL CHAMBER INITIAL IMPLANT;  Surgeon: Isaias Cowman, MD;  Location: [facility] ORS;  Service: Cardiovascular;  Laterality: N/A;   SPINE SURGERY      There were no vitals filed for this visit.   Subjective Assessment - 06/18/21 1351     Subjective Patient reports his knee is feeling some better after injection. He states he is trying out a new knee brace today for the first time.    Patient is accompained by: Family member    Pertinent History Pt referred for deconditioning and gait instability. He has had a decline since he last finished therapy. Pt had a hospital admission from 05/02/20 to 05/14/20 for acute cholecystitis and is now s/p cholecystostomy drain placement. Drain is still in place and pt is waiting to hear when they plan to remove it. Pt discharged to SNF and then returned home. In addition pt underwent radiation therapy for a RUL lung mass/cancer and had his last radiation treatment in September of 2021. He reports that he has continued to walk at home as  his primary exercise. Pt was previously seen at this clinic for PT/OT due to weakness/imbalance s/p CVA. Prior history from 09/21/19: Pt is a 87 year old male who presents with imbalance and difficulty with gait following a right frontal CVA with left hemiparesis on 05/22/19.  He has completed inpatient and home health PT, and now presents for OP PT.  He was discharged from inpatient rehab at a supervision level of assistance, ambulating with a RW.  His family notes that he is impulsive and will try to ambulate without his RW at home.  Family is currently providing 24/7 supervision.    Limitations House hold activities;Walking    How long can  you sit comfortably? no limitations    How long can you walk comfortably? he requires a RW    Patient Stated Goals Pt wants to be able to walk his granddaughter down the aisle at her wedding November 2022    Currently in Pain? Yes    Pain Score --   Patient reports no pain walking in but states he has pain everyday but not as bad overall today.   Pain Orientation Left;Anterior    Pain Descriptors / Indicators Aching    Pain Type Chronic pain    Pain Onset More than a month ago    Pain Frequency Intermittent              TherEx Nustep, LE only, seat 10 and resistance Level 2 for  5 min for warm up.    Performed in // bars: Standing marches  using GTB 2 x 15 BLE with UE support; Side steps 2 x10 steps BLE up and over orange hurdle- VC for foot clearance, posture.  Sit to stand from standard height chair with 1 UE support;  X 5 reps with left foot propped up on Green therastep (increased difficulty requiring brief rest break and CGA)   Neuromuscular re-education:   Static standing on blue airex beam (tandem) holding for 5-10 sec  Standing on purple pad while holding onto rainbow ball and performing trunk twist x 10 reps (left to right)  and 10 more holding ball up toward ceiling and then back down.   Static stand on balance beam with trunk twist with ball x 10 reps each.  Static standing (side stepping) along balance beam with minimal UE support.  Static stand on purple pad (tandem) hold 3-10 sec x multiple reps.    Education provided throughout session via VC/TC and demonstration to facilitate movement at target joints and correct muscle activation for all exercises performed.      Clinical Impression: Patient performed well with knee braced donned and no complaint of knee pain throughout session. He continues to require VC for posture with standing and verbal cues for correct exercise technique. He was able to perform step up better with more control today than in previous  sessions. Pt will benefit from further skilled PT to improve BLE strength, balance and functional mobility.                            PT Education - 06/18/21 1519     Education Details exercise technique    Person(s) Educated Patient    Methods Explanation;Demonstration;Tactile cues;Verbal cues    Comprehension Verbalized understanding;Returned demonstration;Verbal cues required;Tactile cues required;Need further instruction              PT Short Term Goals - 01/21/21 1438  PT SHORT TERM GOAL #1   Title Pt will be independent with HEP in order to improve strength and balance in order to decrease fall risk and improve function at home.    Baseline 01/02/2021- Patient verbalize understanding of walking and seated exercises for home program. 01/21/21 patient has been more active and understands HEP.    Time 6    Period Weeks    Status Achieved    Target Date 01/10/21               PT Long Term Goals - 06/11/21 1458       PT LONG TERM GOAL #1   Title Pt will improve BERG by at least 3 points in order to demonstrate clinically significant improvement in balance.    Baseline 09/20/20: 32/56 (Previous discharge: 03/06/20: 46/56), 12/14: 32/56, 1/13: 31/56, 01/16/21 35/56, 02/06/2021=36/56. 02/18/2021= 37/56.03/20/2021- Deferred secondary to left knee aching- will attempt next visit. 05/06/2021= 39/56; 05/13/2021= 36/56 *patient having more left knee pain.    Time 12    Period Weeks    Status Achieved      PT LONG TERM GOAL #2   Title Pt will decrease TUG to below 14 seconds/decrease in order to demonstrate decreased fall risk.    Baseline 09/20/20: 19.2s, 12/14: 17.48, 1/13: 16.65 sec. 01/02/2021=15.75 sec using 4WW, 01/21/21  14.37 seconds. 02/06/2021= 14.92 sec using 4WW. 02/18/2021= 14.5 sec using 4WW. 03/20/2021- Deferred secondary to left knee aching- will attempt next visit. 05/06/2021= 16.32 sec avg using 4WW. 05/13/2021= 16.5 sec with 4WW *increased left knee  pain. 06/11/21: 20.11 seconds (increased time to turn) using 4WW;    Time 12    Period Weeks    Status On-going    Target Date 08/05/21      PT LONG TERM GOAL #3   Title Pt will decrease 5TSTS by at least 3 seconds in order to demonstrate clinically significant improvement in LE strength.    Baseline 09/20/20: 23.1s with minA+1 for strength and balance (Previous discharge 03/06/20: 16.1s), 12/14: 20 sec, 1/13: 22 sec.   01/02/2021= 16.05 sec with light UE support. 01/21/21 01/21/21 37.90 seconds without UE, with UE support 12.93 seconds: 02/06/2021= with 1 UE 17.93 sec. 02/18/2021= 21.33 sec without UE support. 05/06/2021= 15.3 sec with BUE Support and 21. 0 sec without UE support. 05/13/2021=15.0 sec with BUE support; 16 sec without UE support.    Time 12    Period Weeks    Status Achieved      PT LONG TERM GOAL #4   Title Pt will increase his self-selected 10MWT to >0.74 m/s in order to demonstrate improvement in gait speed and improved community ambulation    Baseline 09/20/20: self-selected: 14.4s = 0.69 m/s with rollator (Previous discharge 03/06/20: self-selected: 13.6s = 0.74 m/s), 12/14: 0.75 m/s, 1/13: 0.78 m/s. 01/02/2021= 0.85 m/s using 4WW, 01/16/21 with 4 WW .99 m/s. 02/06/2021= self selected = 0.93 m/s with 3GU    Time 4    Period Weeks    Status Achieved      PT LONG TERM GOAL #5   Title Patient will increase six minute walk test distance to >1000 for progression to community ambulator and improve gait ability    Baseline 01/10/20  620 ft with rollator, 01/21/21  965 ft; 02/06/2021= 910 feet with 4WW.  02/18/2021= 950 feet with 4WW. 05/06/2021= will test next visit. 05/13/2021- Deferred secondary to increased left knee pain. 06/11/21: 760ft with 4QI *knee pain throughout test.    Time 12  Period Weeks    Status On-going    Target Date 08/05/21      PT LONG TERM GOAL #6   Title Patient will demonstrate ability to walk with HHA +1 > 200 ft. without loss of balance on level surfaces to simulate  walking his grandaughter down the aisle for her upcoming wedding in November 2022.    Baseline 05/13/2021- Patient currently ambulating short community distance with use of 4WW but desires to walk with his grandaughter with HHA for her wedding. 06/11/21: attempted 5 steps with HHA x1, deemed unsafe.    Time 12    Period Weeks    Status On-going    Target Date 08/05/21      PT LONG TERM GOAL #7   Title Pt will decrease 5TSTS  to 14 sec or less with BUE support on armrest in order to demonstrate clinically significant improvement in LE strength.    Baseline 05/13/2021=5.0 sec with BUE support; 16 sec without UE support. 06/11/21: 14.61 seconds with BUE support.    Time 12    Period Weeks    Status On-going    Target Date 08/05/21                   Plan - 06/18/21 1522     Clinical Impression Statement Patient performed well with knee braced donned and no complaint of knee pain throughout session. He continues to require VC for posture with standing and verbal cues for correct exercise technique. He was able to perform step up better with more control today than in previous sessions. Pt will benefit from further skilled PT to improve BLE strength, balance and functional mobility.    Personal Factors and Comorbidities Age;Comorbidity 3+    Comorbidities HTN, CVA, cholecystitis with cholecystostomy drain    Examination-Activity Limitations Bend;Lift;Squat;Locomotion Level;Stairs;Stand    Examination-Participation Restrictions Community Activity;Driving;Laundry;Meal Prep;Shop    Stability/Clinical Decision Making Unstable/Unpredictable    Rehab Potential Fair    PT Frequency 2x / week    PT Duration 12 weeks    PT Treatment/Interventions ADLs/Self Care Home Management;Cryotherapy;Electrical Stimulation;Iontophoresis 4mg /ml Dexamethasone;Moist Heat;Traction;Ultrasound;DME Instruction;Gait training;Stair training;Therapeutic activities;Therapeutic exercise;Functional mobility  training;Balance training;Neuromuscular re-education;Patient/family education;Manual techniques;Dry needling;Vestibular;Joint Manipulations;Spinal Manipulations;Aquatic Therapy    PT Next Visit Plan Continue to focus on LE strengthening and balance, dynamic balance tasks    PT Home Exercise Plan no changes    Consulted and Agree with Plan of Care Patient             Patient will benefit from skilled therapeutic intervention in order to improve the following deficits and impairments:  Abnormal gait, Decreased balance, Decreased mobility, Difficulty walking, Decreased knowledge of precautions, Decreased safety awareness, Decreased activity tolerance, Decreased strength, Decreased endurance  Visit Diagnosis: Abnormality of gait and mobility  Difficulty in walking, not elsewhere classified  Muscle weakness (generalized)  Other lack of coordination     Problem List Patient Active Problem List   Diagnosis Date Noted   Moderate protein-calorie malnutrition ([place]) 03/11/2021   Hemiparesis affecting left side as late effect of cerebrovascular accident (CVA) ([place]) 03/11/2021   Aneurysm, aorta, thoracic ([place]) 12/21/2020   Calculus of bile duct without cholecystitis and without obstruction    Non-small cell carcinoma of lung, right ([place]) 09/07/2020   Mass of upper lobe of right lung 07/13/2020   Calculus of bile duct with cholecystitis without obstruction    Abnormal findings on imaging of biliary tract    Acute cholecystitis 05/02/2020   Chronic venous insufficiency 12/05/2019  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    CKD (chronic kidney disease), stage II    New onset atrial fibrillation ([facility])    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   Goals of care, counseling/discussion    Palliative care by specialist    DNR (do not resuscitate) discussion    History of CVA (cerebrovascular accident) 05/22/2019   Persistent proteinuria 07/30/2018   [place]itz type 2 second  degree heart block 07/13/2018   Chronic kidney disease, stage III (moderate) ([place]) 06/15/2018   Leg pain 05/06/2018   Lymphedema 05/06/2018   Coagulopathy ([place]) 07/07/2017   Lumbar spondylosis 03/10/2017   Elevated uric acid in blood 11/20/2016   BPH (benign prostatic hyperplasia) 05/29/2016   Peripheral vascular disease of lower extremity ([place]) 03/03/2016   Gallstone 12/31/2015   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   Atherosclerosis of aorta ([place]) 11/22/2015   Carotid artery narrowing 11/22/2015   Diverticulosis of colon 11/22/2015   Decreased creatinine clearance 11/22/2015   Arthritis, degenerative 11/22/2015   Lactose intolerance 11/22/2015   Basal cell carcinoma 11/22/2015   Essential hypertension 06/21/2015   Hyperlipemia 06/21/2015   GERD (gastroesophageal reflux disease) 06/21/2015   Calculus of kidney 11/18/2013    Lewis Moccasin, PT 06/18/2021, 4:48 PM  [place] [facility]IN [facility] SERVICES [address] [place], [place], [address] Phone: [phone]   Fax:  [phone]  Name: Jose Castro MRN: [id number] Date of Birth: 1926/01/06

## 2021-06-20 ENCOUNTER — Other Ambulatory Visit: Payer: Self-pay

## 2021-06-20 ENCOUNTER — Ambulatory Visit: Payer: Medicare Other

## 2021-06-20 DIAGNOSIS — R2681 Unsteadiness on feet: Secondary | ICD-10-CM

## 2021-06-20 DIAGNOSIS — R269 Unspecified abnormalities of gait and mobility: Secondary | ICD-10-CM

## 2021-06-20 DIAGNOSIS — R278 Other lack of coordination: Secondary | ICD-10-CM

## 2021-06-20 DIAGNOSIS — M6281 Muscle weakness (generalized): Secondary | ICD-10-CM

## 2021-06-20 DIAGNOSIS — R262 Difficulty in walking, not elsewhere classified: Secondary | ICD-10-CM

## 2021-06-20 NOTE — Therapy (Signed)
[place] MAIN [facility] SERVICES [address] [place], [place], [address] Phone: [phone]   Fax:  [phone]  Physical Therapy Treatment  Patient Details  Name: Jose Castro MRN: [id number] Date of Birth: 10/30/1926 Referring Provider (PT): Dr. Ancil Boozer   Encounter Date: 06/20/2021   PT End of Session - 06/20/21 1432     Visit Number 43    Number of Visits 90    Date for PT Re-Evaluation 08/05/21    Authorization Type eval: 09/20/20, Progress note 01/02/2021, PN on 04/07/997, Recert = 3/38/2505-3/97/6734; PN on 4/202022; PN on 12/09/3788; Recert= 2/40/97353-01/09/9241; PN on 06/11/21;    PT Start Time 1341    PT Stop Time 1423    PT Time Calculation (min) 42 min    Equipment Utilized During Treatment Gait belt    Activity Tolerance Patient tolerated treatment well    Behavior During Therapy WFL for tasks assessed/performed             Past Medical History:  Diagnosis Date   Allergy    Penicillin   Arthritis    Basal cell carcinoma    BPH (benign prostatic hyperplasia)    Chronic kidney disease    had a kidney stone which per family was a cyst that was removed   Dysrhythmia    GERD (gastroesophageal reflux disease)    History of kidney stones    Hyperlipidemia    Hypertension    Left-sided low back pain with left-sided sciatica    Lung mass    Splenic vein thrombosis    Stroke ([place]) 05/22/2019   Thrombosis 12/2015   mural  area and no notation of heart attack    Past Surgical History:  Procedure Laterality Date   APPENDECTOMY     CATARACT EXTRACTION, BILATERAL     CYSTOSCOPY WITH INSERTION OF UROLIFT     ENDOSCOPIC RETROGRADE CHOLANGIOPANCREATOGRAPHY (ERCP) WITH PROPOFOL N/A 05/11/2020   Procedure: ENDOSCOPIC RETROGRADE CHOLANGIOPANCREATOGRAPHY (ERCP) WITH PROPOFOL;  Surgeon: [name], MD;  Location: [facility] ENDOSCOPY;  Service: Endoscopy;  Laterality: N/A;   ERCP N/A 10/23/2020   Procedure: ENDOSCOPIC RETROGRADE  CHOLANGIOPANCREATOGRAPHY (ERCP);  Surgeon: [name], MD;  Location: [facility] ENDOSCOPY;  Service: Endoscopy;  Laterality: N/A;   EYE SURGERY  08/2018   IR EXCHANGE BILIARY DRAIN  05/04/2020   KIDNEY STONE SURGERY     KNEE SURGERY     PACEMAKER INSERTION N/A 07/13/2018   Procedure: INSERTION PACEMAKER-DUEL CHAMBER INITIAL IMPLANT;  Surgeon: Isaias Cowman, MD;  Location: [facility] ORS;  Service: Cardiovascular;  Laterality: N/A;   SPINE SURGERY      There were no vitals filed for this visit.   Subjective Assessment - 06/20/21 1430     Subjective Patient reports his knee continues to feel better. "It doesn't hurt at rest but about a 4/10 with weight bearing."    Patient is accompained by: Family member    Pertinent History Pt referred for deconditioning and gait instability. He has had a decline since he last finished therapy. Pt had a hospital admission from 05/02/20 to 05/14/20 for acute cholecystitis and is now s/p cholecystostomy drain placement. Drain is still in place and pt is waiting to hear when they plan to remove it. Pt discharged to SNF and then returned home. In addition pt underwent radiation therapy for a RUL lung mass/cancer and had his last radiation treatment in September of 2021. He reports that he has continued to walk at home as his primary exercise. Pt was  previously seen at this clinic for PT/OT due to weakness/imbalance s/p CVA. Prior history from 09/21/19: Pt is a 87 year old male who presents with imbalance and difficulty with gait following a right frontal CVA with left hemiparesis on 05/22/19.  He has completed inpatient and home health PT, and now presents for OP PT.  He was discharged from inpatient rehab at a supervision level of assistance, ambulating with a RW.  His family notes that he is impulsive and will try to ambulate without his RW at home.  Family is currently providing 24/7 supervision.    Limitations House hold activities;Walking    How long can you sit  comfortably? no limitations    How long can you walk comfortably? he requires a RW    Patient Stated Goals Pt wants to be able to walk his granddaughter down the aisle at her wedding November 2022    Currently in Pain? Yes    Pain Score 4     Pain Location Knee    Pain Orientation Left;Anterior    Pain Descriptors / Indicators Aching    Pain Type Chronic pain    Pain Onset More than a month ago    Pain Frequency Constant            Interventions:  Therapeutic Exercises:  Seated Ham curl with RTB BLE 2 sets of 15 reps. Patient rates as medium.   Seated Hip flex BLE  2 sets of 10 reps each. Decreased ability with Left LE vs. Right but able to complete.   Sit to stand followed by Bilateral shoulder flex x 12 reps. VC for erect posture and TC to expand chest and elevate arms as much as possible.   Discussed use of 4WW vs. Upright walker as patient daughter thought the current walker was possibly too low. Reassessed Height of walker and the walker is the correct height when patient is standing erect. Family to think about an upright walker and will discuss next 1-2 visits.   Dynamic walking using 3lb. AW BLE x 150 feet with improving step clearance with min to no VC for seqencing (versus patient initially walking in- dragging left LE).   LAQ with 3LB AW -3 sec hold 2 sets of 15 reps. Patient rates as medium.   Instructed in seated HS stretching- hold 20 sec x 4 sets     Neuromuscular re-ed:  Heel to to toe sequencing - using 3lb AW and red theraband 2 sets of 10 reps with VC to maintain clearing Left LE  Education provided throughout session via VC/TC and demonstration to facilitate movement at target joints and correct muscle activation for all exercises performed.   Clinical Impression: Patient performed well today and does much better with just minimal verbal cues for clearing left LE. Patient required increased cueing for correct position of hamstring stretching. He ambulated  well and even better with 3lb ankle weights donned vs. Pre-therapy observation. Pt will benefit from further skilled PT to improve BLE strength, balance and functional mobility.                            PT Education - 06/20/21 1431     Education Details specific exercise technique    Person(s) Educated Patient    Methods Explanation;Demonstration;Tactile cues;Verbal cues    Comprehension Verbalized understanding;Returned demonstration;Verbal cues required;Tactile cues required;Need further instruction              PT Short Term  Goals - 01/21/21 1438       PT SHORT TERM GOAL #1   Title Pt will be independent with HEP in order to improve strength and balance in order to decrease fall risk and improve function at home.    Baseline 01/02/2021- Patient verbalize understanding of walking and seated exercises for home program. 01/21/21 patient has been more active and understands HEP.    Time 6    Period Weeks    Status Achieved    Target Date 01/10/21               PT Long Term Goals - 06/11/21 1458       PT LONG TERM GOAL #1   Title Pt will improve BERG by at least 3 points in order to demonstrate clinically significant improvement in balance.    Baseline 09/20/20: 32/56 (Previous discharge: 03/06/20: 46/56), 12/14: 32/56, 1/13: 31/56, 01/16/21 35/56, 02/06/2021=36/56. 02/18/2021= 37/56.03/20/2021- Deferred secondary to left knee aching- will attempt next visit. 05/06/2021= 39/56; 05/13/2021= 36/56 *patient having more left knee pain.    Time 12    Period Weeks    Status Achieved      PT LONG TERM GOAL #2   Title Pt will decrease TUG to below 14 seconds/decrease in order to demonstrate decreased fall risk.    Baseline 09/20/20: 19.2s, 12/14: 17.48, 1/13: 16.65 sec. 01/02/2021=15.75 sec using 4WW, 01/21/21  14.37 seconds. 02/06/2021= 14.92 sec using 4WW. 02/18/2021= 14.5 sec using 4WW. 03/20/2021- Deferred secondary to left knee aching- will attempt next visit.  05/06/2021= 16.32 sec avg using 4WW. 05/13/2021= 16.5 sec with 4WW *increased left knee pain. 06/11/21: 20.11 seconds (increased time to turn) using 4WW;    Time 12    Period Weeks    Status On-going    Target Date 08/05/21      PT LONG TERM GOAL #3   Title Pt will decrease 5TSTS by at least 3 seconds in order to demonstrate clinically significant improvement in LE strength.    Baseline 09/20/20: 23.1s with minA+1 for strength and balance (Previous discharge 03/06/20: 16.1s), 12/14: 20 sec, 1/13: 22 sec.   01/02/2021= 16.05 sec with light UE support. 01/21/21 01/21/21 37.90 seconds without UE, with UE support 12.93 seconds: 02/06/2021= with 1 UE 17.93 sec. 02/18/2021= 21.33 sec without UE support. 05/06/2021= 15.3 sec with BUE Support and 21. 0 sec without UE support. 05/13/2021=15.0 sec with BUE support; 16 sec without UE support.    Time 12    Period Weeks    Status Achieved      PT LONG TERM GOAL #4   Title Pt will increase his self-selected 10MWT to >0.74 m/s in order to demonstrate improvement in gait speed and improved community ambulation    Baseline 09/20/20: self-selected: 14.4s = 0.69 m/s with rollator (Previous discharge 03/06/20: self-selected: 13.6s = 0.74 m/s), 12/14: 0.75 m/s, 1/13: 0.78 m/s. 01/02/2021= 0.85 m/s using 4WW, 01/16/21 with 4 WW .99 m/s. 02/06/2021= self selected = 0.93 m/s with 1OX    Time 4    Period Weeks    Status Achieved      PT LONG TERM GOAL #5   Title Patient will increase six minute walk test distance to >1000 for progression to community ambulator and improve gait ability    Baseline 01/10/20  620 ft with rollator, 01/21/21  965 ft; 02/06/2021= 910 feet with 4WW.  02/18/2021= 950 feet with 4WW. 05/06/2021= will test next visit. 05/13/2021- Deferred secondary to increased left knee pain. 06/11/21: 749ft with  2EQ *knee pain throughout test.    Time 12    Period Weeks    Status On-going    Target Date 08/05/21      PT LONG TERM GOAL #6   Title Patient will demonstrate ability to  walk with HHA +1 > 200 ft. without loss of balance on level surfaces to simulate walking his grandaughter down the aisle for her upcoming wedding in November 2022.    Baseline 05/13/2021- Patient currently ambulating short community distance with use of 4WW but desires to walk with his grandaughter with HHA for her wedding. 06/11/21: attempted 5 steps with HHA x1, deemed unsafe.    Time 12    Period Weeks    Status On-going    Target Date 08/05/21      PT LONG TERM GOAL #7   Title Pt will decrease 5TSTS  to 14 sec or less with BUE support on armrest in order to demonstrate clinically significant improvement in LE strength.    Baseline 05/13/2021=5.0 sec with BUE support; 16 sec without UE support. 06/11/21: 14.61 seconds with BUE support.    Time 12    Period Weeks    Status On-going    Target Date 08/05/21                   Plan - 06/20/21 1456     Clinical Impression Statement Patient performed well today and does much better with just minimal verbal cues for clearing left LE. Patient required increased cueing for correct position of hamstring stretching. He ambulated well and even better with 3lb ankle weights donned vs. Pre-therapy observation. Pt will benefit from further skilled PT to improve BLE strength, balance and functional mobility.    Personal Factors and Comorbidities Age;Comorbidity 3+    Comorbidities HTN, CVA, cholecystitis with cholecystostomy drain    Examination-Activity Limitations Bend;Lift;Squat;Locomotion Level;Stairs;Stand    Examination-Participation Restrictions Community Activity;Driving;Laundry;Meal Prep;Shop    Stability/Clinical Decision Making Unstable/Unpredictable    Rehab Potential Fair    PT Frequency 2x / week    PT Duration 12 weeks    PT Treatment/Interventions ADLs/Self Care Home Management;Cryotherapy;Electrical Stimulation;Iontophoresis 4mg /ml Dexamethasone;Moist Heat;Traction;Ultrasound;DME Instruction;Gait training;Stair  training;Therapeutic activities;Therapeutic exercise;Functional mobility training;Balance training;Neuromuscular re-education;Patient/family education;Manual techniques;Dry needling;Vestibular;Joint Manipulations;Spinal Manipulations;Aquatic Therapy    PT Next Visit Plan Continue to focus on LE strengthening and balance, dynamic balance tasks    PT Home Exercise Plan no changes    Consulted and Agree with Plan of Care Patient             Patient will benefit from skilled therapeutic intervention in order to improve the following deficits and impairments:  Abnormal gait, Decreased balance, Decreased mobility, Difficulty walking, Decreased knowledge of precautions, Decreased safety awareness, Decreased activity tolerance, Decreased strength, Decreased endurance  Visit Diagnosis: Abnormality of gait and mobility  Difficulty in walking, not elsewhere classified  Muscle weakness (generalized)  Unsteadiness on feet  Other lack of coordination     Problem List Patient Active Problem List   Diagnosis Date Noted   Moderate protein-calorie malnutrition (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    CKD (chronic kidney disease), stage II    New onset atrial fibrillation ([facility])    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   Goals of care, counseling/discussion    Palliative care by specialist    DNR (do not resuscitate) discussion    History of CVA  (cerebrovascular accident) 05/22/2019   Persistent proteinuria 07/30/2018   M[place]itz type 2 second degree heart block 07/13/2018   Chronic kidney disease, stage III (moderate) ([place]) 06/15/2018   Leg pain 05/06/2018   Lymphedema 05/06/2018   Coagulopathy ([place]) 07/07/2017   Lumbar spondylosis 03/10/2017   Elevated uric acid in blood 11/20/2016   BPH (benign prostatic hyperplasia) 05/29/2016   Peripheral vascular disease of lower extremity ([place]) 03/03/2016   Gallstone 12/31/2015   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   Atherosclerosis of aorta ([place]) 11/22/2015   Carotid artery narrowing 11/22/2015   Diverticulosis of colon 11/22/2015   Decreased creatinine clearance 11/22/2015   Arthritis, degenerative 11/22/2015   Lactose intolerance 11/22/2015   Basal cell carcinoma 11/22/2015   Essential hypertension 06/21/2015   Hyperlipemia 06/21/2015   GERD (gastroesophageal reflux disease) 06/21/2015   Calculus of kidney 11/18/2013    Lewis Moccasin, PT 06/20/2021, 2:57 PM  [place] MAIN [facility] SERVICES [address] [place], [place], [address] Phone: [phone]   Fax:  [phone]  Name: Jose Castro MRN: [id number] Date of Birth: 1926/04/21

## 2021-06-21 ENCOUNTER — Ambulatory Visit
Admission: RE | Admit: 2021-06-21 | Discharge: 2021-06-21 | Disposition: A | Discharge status: Home / Self Care | Payer: Medicare Other | Source: Ambulatory Visit | Attending: Internal Medicine | Admitting: Internal Medicine

## 2021-06-21 DIAGNOSIS — C3490 Malignant neoplasm of unspecified part of unspecified bronchus or lung: Secondary | ICD-10-CM | POA: Insufficient documentation

## 2021-06-21 LAB — POCT I-STAT CREATININE: Creatinine, Ser: 1.2 mg/dL (ref 0.61–1.24)

## 2021-06-21 MED ORDER — IOHEXOL 300 MG/ML  SOLN
75.0000 mL | Freq: Once | INTRAMUSCULAR | Status: AC | PRN
  Administered 2021-06-21: 60 mL via INTRAVENOUS

## 2021-06-25 ENCOUNTER — Ambulatory Visit: Payer: Medicare Other

## 2021-06-25 ENCOUNTER — Ambulatory Visit: Admission: RE | Admit: 2021-06-25 | Payer: Medicare Other | Source: Ambulatory Visit | Admitting: Radiation Oncology

## 2021-06-25 ENCOUNTER — Encounter: Payer: Self-pay | Admitting: Nurse Practitioner

## 2021-06-25 ENCOUNTER — Inpatient Hospital Stay (HOSPITAL_BASED_OUTPATIENT_CLINIC_OR_DEPARTMENT_OTHER): Payer: Medicare Other | Admitting: Nurse Practitioner

## 2021-06-25 ENCOUNTER — Ambulatory Visit
Admission: RE | Admit: 2021-06-25 | Discharge: 2021-06-25 | Disposition: A | Discharge status: Home / Self Care | Payer: Medicare Other | Source: Ambulatory Visit | Attending: Radiation Oncology | Admitting: Radiation Oncology

## 2021-06-25 ENCOUNTER — Inpatient Hospital Stay: Payer: Medicare Other | Attending: Nurse Practitioner

## 2021-06-25 ENCOUNTER — Other Ambulatory Visit: Payer: Self-pay

## 2021-06-25 VITALS — BP 138/82 | HR 88 | Temp 97.6°F | Resp 18 | Wt 156.0 lb

## 2021-06-25 DIAGNOSIS — R278 Other lack of coordination: Secondary | ICD-10-CM

## 2021-06-25 DIAGNOSIS — Z8673 Personal history of transient ischemic attack (TIA), and cerebral infarction without residual deficits: Secondary | ICD-10-CM | POA: Diagnosis not present

## 2021-06-25 DIAGNOSIS — R059 Cough, unspecified: Secondary | ICD-10-CM | POA: Insufficient documentation

## 2021-06-25 DIAGNOSIS — K219 Gastro-esophageal reflux disease without esophagitis: Secondary | ICD-10-CM | POA: Insufficient documentation

## 2021-06-25 DIAGNOSIS — I129 Hypertensive chronic kidney disease with stage 1 through stage 4 chronic kidney disease, or unspecified chronic kidney disease: Secondary | ICD-10-CM | POA: Diagnosis not present

## 2021-06-25 DIAGNOSIS — R918 Other nonspecific abnormal finding of lung field: Secondary | ICD-10-CM

## 2021-06-25 DIAGNOSIS — E785 Hyperlipidemia, unspecified: Secondary | ICD-10-CM | POA: Insufficient documentation

## 2021-06-25 DIAGNOSIS — N189 Chronic kidney disease, unspecified: Secondary | ICD-10-CM | POA: Diagnosis not present

## 2021-06-25 DIAGNOSIS — Z7901 Long term (current) use of anticoagulants: Secondary | ICD-10-CM | POA: Diagnosis not present

## 2021-06-25 DIAGNOSIS — C3491 Malignant neoplasm of unspecified part of right bronchus or lung: Secondary | ICD-10-CM

## 2021-06-25 DIAGNOSIS — Z8052 Family history of malignant neoplasm of bladder: Secondary | ICD-10-CM | POA: Diagnosis not present

## 2021-06-25 DIAGNOSIS — R262 Difficulty in walking, not elsewhere classified: Secondary | ICD-10-CM

## 2021-06-25 DIAGNOSIS — R269 Unspecified abnormalities of gait and mobility: Secondary | ICD-10-CM | POA: Diagnosis not present

## 2021-06-25 DIAGNOSIS — Z79899 Other long term (current) drug therapy: Secondary | ICD-10-CM | POA: Diagnosis not present

## 2021-06-25 DIAGNOSIS — I700 Atherosclerosis of aorta: Secondary | ICD-10-CM | POA: Insufficient documentation

## 2021-06-25 DIAGNOSIS — Z85828 Personal history of other malignant neoplasm of skin: Secondary | ICD-10-CM | POA: Diagnosis not present

## 2021-06-25 DIAGNOSIS — M6281 Muscle weakness (generalized): Secondary | ICD-10-CM

## 2021-06-25 DIAGNOSIS — C3411 Malignant neoplasm of upper lobe, right bronchus or lung: Secondary | ICD-10-CM

## 2021-06-25 DIAGNOSIS — N400 Enlarged prostate without lower urinary tract symptoms: Secondary | ICD-10-CM | POA: Diagnosis not present

## 2021-06-25 LAB — CBC WITH DIFFERENTIAL/PLATELET
Abs Immature Granulocytes: 0.03 10*3/uL (ref 0.00–0.07)
Basophils Absolute: 0.1 10*3/uL (ref 0.0–0.1)
Basophils Relative: 1 %
Eosinophils Absolute: 0.3 10*3/uL (ref 0.0–0.5)
Eosinophils Relative: 3 %
HCT: 41.4 % (ref 39.0–52.0)
Hemoglobin: 13.2 g/dL (ref 13.0–17.0)
Immature Granulocytes: 0 %
Lymphocytes Relative: 24 %
Lymphs Abs: 2.1 10*3/uL (ref 0.7–4.0)
MCH: 30.2 pg (ref 26.0–34.0)
MCHC: 31.9 g/dL (ref 30.0–36.0)
MCV: 94.7 fL (ref 80.0–100.0)
Monocytes Absolute: 1.1 10*3/uL — ABNORMAL HIGH (ref 0.1–1.0)
Monocytes Relative: 13 %
Neutro Abs: 5.4 10*3/uL (ref 1.7–7.7)
Neutrophils Relative %: 59 %
Platelets: 240 10*3/uL (ref 150–400)
RBC: 4.37 MIL/uL (ref 4.22–5.81)
RDW: 14.4 % (ref 11.5–15.5)
WBC: 9.1 10*3/uL (ref 4.0–10.5)
nRBC: 0 % (ref 0.0–0.2)

## 2021-06-25 LAB — COMPREHENSIVE METABOLIC PANEL
ALT: 8 U/L (ref 0–44)
AST: 14 U/L — ABNORMAL LOW (ref 15–41)
Albumin: 4 g/dL (ref 3.5–5.0)
Alkaline Phosphatase: 58 U/L (ref 38–126)
Anion gap: 7 (ref 5–15)
BUN: 29 mg/dL — ABNORMAL HIGH (ref 8–23)
CO2: 27 mmol/L (ref 22–32)
Calcium: 9.2 mg/dL (ref 8.9–10.3)
Chloride: 103 mmol/L (ref 98–111)
Creatinine, Ser: 1.25 mg/dL — ABNORMAL HIGH (ref 0.61–1.24)
GFR, Estimated: 54 mL/min — ABNORMAL LOW (ref >60)
Glucose, Bld: 93 mg/dL (ref 70–99)
Potassium: 4.3 mmol/L (ref 3.5–5.1)
Sodium: 137 mmol/L (ref 135–145)
Total Bilirubin: 1 mg/dL (ref 0.3–1.2)
Total Protein: 6.6 g/dL (ref 6.5–8.1)

## 2021-06-25 NOTE — Progress Notes (Signed)
Radiation Oncology Follow up Note  Name: Jose Castro   Date:   06/25/2021 MRN:  [id number] DOB: 09-13-1926    This 87 y.o. male presents to the clinic today for 99-month follow-up status post SBRT to posterior portion of the right upper lobe for stage I non-small cell lung cancer.  REFERRING PROVIDER: Steele Sizer, MD  HPI: Patient is a 87 year old male now at 10 months having completed SBRT to the posterior portion of the right upper lobe for stage I non-small cell lung cancer seen today in routine follow-up clinically is doing well specifically [place] cough hemoptysis or chest tightness.Marland Kitchen  His most recent CT scan this month shows progressive changes secondary to radiation scarring with the treated lesion no longer measurable or separate from his scarring.  He has multiple nonsolid nodules again seen within both lungs not significantly changed in size.  There are 2 new nonsolid nodules in the right lower lobe the largest measuring 1.8 cm with carcinoma cannot be excluded.  COMPLICATIONS OF TREATMENT: none  FOLLOW UP COMPLIANCE: keeps appointments   PHYSICAL EXAM:  There were no vitals taken for this visit. Well-developed well-nourished patient in NAD. HEENT reveals PERLA, EOMI, discs not visualized.  Oral cavity is clear. No oral mucosal lesions are identified. Neck is clear without evidence of cervical or supraclavicular adenopathy. Lungs are clear to A&P. Cardiac examination is essentially unremarkable with regular rate and rhythm without murmur rub or thrill. Abdomen is benign with no organomegaly or masses noted. Motor sensory and DTR levels are equal and symmetric in the upper and lower extremities. Cranial nerves II through XII are grossly intact. Proprioception is intact. No peripheral adenopathy or edema is identified. No motor or sensory levels are noted. Crude visual fields are within normal range.  RADIOLOGY RESULTS: CT scan reviewed compatible with above-stated findings  PLAN:  At this time I have ordered a PET CT scan in 3 months with a follow-up with me shortly after.  Should these new nodules show any evidence of hypermetabolic activity may consider SBRT to them.  Patient's daughter is comprehending my treatment plan and recommendations well.  Appointments were made.  Patient and family know to call with any concerns.  I would like to take this opportunity to thank you for allowing me to participate in the care of your patient.Noreene Filbert, MD

## 2021-06-25 NOTE — Progress Notes (Signed)
No new concerns today

## 2021-06-25 NOTE — Addendum Note (Signed)
Encounter addended by: Janan Halter, CMA on: 06/25/2021 2:57 PM  Actions taken: Visit diagnoses modified, Order list changed, Diagnosis association updated

## 2021-06-25 NOTE — Therapy (Signed)
[place] [facility] SERVICES [address], [place], [address] Phone: [phone]   Fax:  [phone]  Physical Therapy Treatment  Patient Details  Name: Jose Castro MRN: [id number] Date of Birth: Oct 10, 1926 Referring Provider (PT): Dr. Ancil Boozer   Encounter Date: 06/25/2021   PT End of Session - 06/25/21 1330     Visit Number 37    Number of Visits 6    Date for PT Re-Evaluation 08/05/21    Authorization Type eval: 09/20/20, Progress note 01/02/2021, PN on 03/03/3535, Recert = 1/44/3154-0/07/6760; PN on 4/202022; PN on 08/06/931; Recert= 6/71/24580-08/09/8337; PN on 06/11/21;    PT Start Time 1345    PT Stop Time 1426    PT Time Calculation (min) 41 min    Equipment Utilized During Treatment Gait belt    Activity Tolerance Patient tolerated treatment well    Behavior During Therapy WFL for tasks assessed/performed             Past Medical History:  Diagnosis Date   Allergy    Penicillin   Arthritis    Basal cell carcinoma    BPH (benign prostatic hyperplasia)    Chronic kidney disease    had a kidney stone which per family was a cyst that was removed   Dysrhythmia    GERD (gastroesophageal reflux disease)    History of kidney stones    Hyperlipidemia    Hypertension    Left-sided low back pain with left-sided sciatica    Lung mass    Splenic vein thrombosis    Stroke ([place]) 05/22/2019   Thrombosis 12/2015   mural  area and no notation of heart attack    Past Surgical History:  Procedure Laterality Date   APPENDECTOMY     CATARACT EXTRACTION, BILATERAL     CYSTOSCOPY WITH INSERTION OF UROLIFT     ENDOSCOPIC RETROGRADE CHOLANGIOPANCREATOGRAPHY (ERCP) WITH PROPOFOL N/A 05/11/2020   Procedure: ENDOSCOPIC RETROGRADE CHOLANGIOPANCREATOGRAPHY (ERCP) WITH PROPOFOL;  Surgeon: [name], MD;  [facility];  Service: Endoscopy;  Laterality: N/A;   ERCP N/A 10/23/2020   Procedure: ENDOSCOPIC RETROGRADE  CHOLANGIOPANCREATOGRAPHY (ERCP);  Surgeon: [name], MD;  Location: [facility] ENDOSCOPY;  Service: Endoscopy;  Laterality: N/A;   EYE SURGERY  08/2018   IR EXCHANGE BILIARY DRAIN  05/04/2020   KIDNEY STONE SURGERY     KNEE SURGERY     PACEMAKER INSERTION N/A 07/13/2018   Procedure: INSERTION PACEMAKER-DUEL CHAMBER INITIAL IMPLANT;  Surgeon: Isaias Cowman, MD;  Location: [facility] ORS;  Service: Cardiovascular;  Laterality: N/A;   SPINE SURGERY      There were no vitals filed for this visit.   Subjective Assessment - 06/25/21 1329     Subjective Patient reports he is tired today as he as already been to 2 medical appointments.    Patient is accompained by: Family member    Pertinent History Pt referred for deconditioning and gait instability. He has had a decline since he last finished therapy. Pt had a hospital admission from 05/02/20 to 05/14/20 for acute cholecystitis and is now s/p cholecystostomy drain placement. Drain is still in place and pt is waiting to hear when they plan to remove it. Pt discharged to SNF and then returned home. In addition pt underwent radiation therapy for a RUL lung mass/cancer and had his last radiation treatment in September of 2021. He reports that he has continued to walk at home as his primary exercise. Pt was previously seen at this clinic  for PT/OT due to weakness/imbalance s/p CVA. Prior history from 09/21/19: Pt is a 87 year old male who presents with imbalance and difficulty with gait following a right frontal CVA with left hemiparesis on 05/22/19.  He has completed inpatient and home health PT, and now presents for OP PT.  He was discharged from inpatient rehab at a supervision level of assistance, ambulating with a RW.  His family notes that he is impulsive and will try to ambulate without his RW at home.  Family is currently providing 24/7 supervision.    Limitations House hold activities;Walking    How long can you sit comfortably? no limitations    How long  can you walk comfortably? he requires a RW    Patient Stated Goals Pt wants to be able to walk his granddaughter down the aisle at her wedding November 2022    Currently in Pain? Yes    Pain Score 4     Pain Location Knee    Pain Orientation Left;Anterior    Pain Descriptors / Indicators Aching    Pain Type Chronic pain    Pain Onset More than a month ago    Pain Frequency Constant    Aggravating Factors  Prolonged standing/walking    Pain Relieving Factors Rest    Effect of Pain on Daily Activities Decreased standing/walking ability               Interventions:  Therapeutic Exercises: Nustep L2 for 5 min with LE only- Patient required min VC to perform slow and under control- Tendency to try to go as fast as he can but fatigues out quickly (trying to go around 100 SPM). Instructed him to perform between 58-80 SPM and he responded well without significant difficulty.   Seated ham curls with matrix cable system at 7.5 lb. 2 sets of 12 reps   Scap retraction with Matrix cable system at 7.5 lb. 2 sets of 15 reps and 3rd set at 12.5 lb. X 12 reps.   Seated hip flex with 3lb AW 2 sets of 12 reps  Seated Hip Flex with abd (up and over hurdle) with 3lb AW - 2 sets of 12 reps Seated LAQ  with 3lb AW - 2 sets of 12 reps Standing calf raises- x 15 reps  Education provided throughout session via VC/TC and demonstration to facilitate movement at target joints and correct muscle activation for all  exercises performed.   Clinical Impression: Treatment limited to mostly seated therex secondary to patient being tired from having multiple medical appointments today. He was responsive to all VC, TC and able to perform some resistive exercises with fatigue as only limiting factor. Pt will benefit from further skilled PT to improve BLE strength, balance and functional mobility.                         PT Education - 06/26/21 1330     Education Details exercise technique     Person(s) Educated Patient    Methods Explanation;Demonstration;Tactile cues;Verbal cues    Comprehension Verbalized understanding;Returned demonstration;Verbal cues required;Tactile cues required;Need further instruction              PT Short Term Goals - 01/21/21 1438       PT SHORT TERM GOAL #1   Title Pt will be independent with HEP in order to improve strength and balance in order to decrease fall risk and improve function at home.    Baseline 01/02/2021- Patient  verbalize understanding of walking and seated exercises for home program. 01/21/21 patient has been more active and understands HEP.    Time 6    Period Weeks    Status Achieved    Target Date 01/10/21               PT Long Term Goals - 06/11/21 1458       PT LONG TERM GOAL #1   Title Pt will improve BERG by at least 3 points in order to demonstrate clinically significant improvement in balance.    Baseline 09/20/20: 32/56 (Previous discharge: 03/06/20: 46/56), 12/14: 32/56, 1/13: 31/56, 01/16/21 35/56, 02/06/2021=36/56. 02/18/2021= 37/56.03/20/2021- Deferred secondary to left knee aching- will attempt next visit. 05/06/2021= 39/56; 05/13/2021= 36/56 *patient having more left knee pain.    Time 12    Period Weeks    Status Achieved      PT LONG TERM GOAL #2   Title Pt will decrease TUG to below 14 seconds/decrease in order to demonstrate decreased fall risk.    Baseline 09/20/20: 19.2s, 12/14: 17.48, 1/13: 16.65 sec. 01/02/2021=15.75 sec using 4WW, 01/21/21  14.37 seconds. 02/06/2021= 14.92 sec using 4WW. 02/18/2021= 14.5 sec using 4WW. 03/20/2021- Deferred secondary to left knee aching- will attempt next visit. 05/06/2021= 16.32 sec avg using 4WW. 05/13/2021= 16.5 sec with 4WW *increased left knee pain. 06/11/21: 20.11 seconds (increased time to turn) using 4WW;    Time 12    Period Weeks    Status On-going    Target Date 08/05/21      PT LONG TERM GOAL #3   Title Pt will decrease 5TSTS by at least 3 seconds in order to  demonstrate clinically significant improvement in LE strength.    Baseline 09/20/20: 23.1s with minA+1 for strength and balance (Previous discharge 03/06/20: 16.1s), 12/14: 20 sec, 1/13: 22 sec.   01/02/2021= 16.05 sec with light UE support. 01/21/21 01/21/21 37.90 seconds without UE, with UE support 12.93 seconds: 02/06/2021= with 1 UE 17.93 sec. 02/18/2021= 21.33 sec without UE support. 05/06/2021= 15.3 sec with BUE Support and 21. 0 sec without UE support. 05/13/2021=15.0 sec with BUE support; 16 sec without UE support.    Time 12    Period Weeks    Status Achieved      PT LONG TERM GOAL #4   Title Pt will increase his self-selected 10MWT to >0.74 m/s in order to demonstrate improvement in gait speed and improved community ambulation    Baseline 09/20/20: self-selected: 14.4s = 0.69 m/s with rollator (Previous discharge 03/06/20: self-selected: 13.6s = 0.74 m/s), 12/14: 0.75 m/s, 1/13: 0.78 m/s. 01/02/2021= 0.85 m/s using 4WW, 01/16/21 with 4 WW .99 m/s. 02/06/2021= self selected = 0.93 m/s with 2VO    Time 4    Period Weeks    Status Achieved      PT LONG TERM GOAL #5   Title Patient will increase six minute walk test distance to >1000 for progression to community ambulator and improve gait ability    Baseline 01/10/20  620 ft with rollator, 01/21/21  965 ft; 02/06/2021= 910 feet with 4WW.  02/18/2021= 950 feet with 4WW. 05/06/2021= will test next visit. 05/13/2021- Deferred secondary to increased left knee pain. 06/11/21: 766ft with 5DG *knee pain throughout test.    Time 12    Period Weeks    Status On-going    Target Date 08/05/21      PT LONG TERM GOAL #6   Title Patient will demonstrate ability to walk with HHA +1 >  200 ft. without loss of balance on level surfaces to simulate walking his grandaughter down the aisle for her upcoming wedding in November 2022.    Baseline 05/13/2021- Patient currently ambulating short community distance with use of 4WW but desires to walk with his grandaughter with HHA for her  wedding. 06/11/21: attempted 5 steps with HHA x1, deemed unsafe.    Time 12    Period Weeks    Status On-going    Target Date 08/05/21      PT LONG TERM GOAL #7   Title Pt will decrease 5TSTS  to 14 sec or less with BUE support on armrest in order to demonstrate clinically significant improvement in LE strength.    Baseline 05/13/2021=5.0 sec with BUE support; 16 sec without UE support. 06/11/21: 14.61 seconds with BUE support.    Time 12    Period Weeks    Status On-going    Target Date 08/05/21                   Plan - 06/25/21 1331     Clinical Impression Statement Treatment limited to mostly seated therex secondary to patient being tired from having multiple medical appointments today. He was responsive to all VC, TC and able to perform some resistive exercises with fatigue as only limiting factor. Pt will benefit from further skilled PT to improve BLE strength, balance and functional mobility.    Personal Factors and Comorbidities Age;Comorbidity 3+    Comorbidities HTN, CVA, cholecystitis with cholecystostomy drain    Examination-Activity Limitations Bend;Lift;Squat;Locomotion Level;Stairs;Stand    Examination-Participation Restrictions Community Activity;Driving;Laundry;Meal Prep;Shop    Stability/Clinical Decision Making Unstable/Unpredictable    Rehab Potential Fair    PT Frequency 2x / week    PT Duration 12 weeks    PT Treatment/Interventions ADLs/Self Care Home Management;Cryotherapy;Electrical Stimulation;Iontophoresis 4mg /ml Dexamethasone;Moist Heat;Traction;Ultrasound;DME Instruction;Gait training;Stair training;Therapeutic activities;Therapeutic exercise;Functional mobility training;Balance training;Neuromuscular re-education;Patient/family education;Manual techniques;Dry needling;Vestibular;Joint Manipulations;Spinal Manipulations;Aquatic Therapy    PT Next Visit Plan Continue to focus on LE strengthening and balance, dynamic balance tasks    PT Home Exercise Plan  no changes    Consulted and Agree with Plan of Care Patient             Patient will benefit from skilled therapeutic intervention in order to improve the following deficits and impairments:  Abnormal gait, Decreased balance, Decreased mobility, Difficulty walking, Decreased knowledge of precautions, Decreased safety awareness, Decreased activity tolerance, Decreased strength, Decreased endurance  Visit Diagnosis: Abnormality of gait and mobility  Difficulty in walking, not elsewhere classified  Muscle weakness (generalized)  Other lack of coordination     Problem List Patient Active Problem List   Diagnosis Date Noted   Moderate protein-calorie malnutrition ([place]) 03/11/2021   Hemiparesis affecting left side as late effect of cerebrovascular accident (CVA) ([place]) 03/11/2021   Aneurysm, aorta, thoracic ([place]) 12/21/2020   Calculus of bile duct without cholecystitis and without obstruction    Non-small cell carcinoma of lung, right ([place]) 09/07/2020   Mass of upper lobe of right lung 07/13/2020   Calculus of bile duct with cholecystitis without obstruction    Abnormal findings on imaging of biliary tract    Acute cholecystitis 05/02/2020   Chronic venous insufficiency 12/05/2019   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    CKD (chronic kidney disease), stage II    New onset atrial fibrillation ([place])    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   Goals of care, counseling/discussion  Palliative care by specialist    DNR (do not resuscitate) discussion    History of CVA (cerebrovascular accident) 05/22/2019   Persistent proteinuria 07/30/2018   M[place]itz type 2 second degree heart block 07/13/2018   Chronic kidney disease, stage III (moderate) ([place]) 06/15/2018   Leg pain 05/06/2018   Lymphedema 05/06/2018   Coagulopathy ([place]) 07/07/2017   Lumbar spondylosis 03/10/2017   Elevated uric acid in blood 11/20/2016   BPH (benign prostatic hyperplasia) 05/29/2016    Peripheral vascular disease of lower extremity ([place]) 03/03/2016   Gallstone 12/31/2015   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   Atherosclerosis of aorta ([place]) 11/22/2015   Carotid artery narrowing 11/22/2015   Diverticulosis of colon 11/22/2015   Decreased creatinine clearance 11/22/2015   Arthritis, degenerative 11/22/2015   Lactose intolerance 11/22/2015   Basal cell carcinoma 11/22/2015   Essential hypertension 06/21/2015   Hyperlipemia 06/21/2015   GERD (gastroesophageal reflux disease) 06/21/2015   Calculus of kidney 11/18/2013    Lewis Moccasin, PT 06/26/2021, 1:35 PM  [place] [facility] SERVICES [address] [place], [place], [address] Phone: [phone]   Fax:  [phone]  Name: Jose Castro MRN: [id number] Date of Birth: June 05, 1926

## 2021-06-25 NOTE — Progress Notes (Signed)
[place] NOTE  Patient Care Team: Steele Sizer, MD as PCP - General (Family Medicine) Royston Cowper, MD as Consulting Physician (Urology) Yolonda Kida, MD as Consulting Physician (Cardiology) Cammie Sickle, MD as Consulting Physician (Internal Medicine) Delana Meyer, Dolores Lory, MD as Consulting Physician (Vascular Surgery) Ralene Bathe, MD as Consulting Physician (Dermatology) Garvin Fila, MD as Consulting Physician (Neurology) Noreene Filbert, MD as Radiation Oncologist (Radiation Oncology)  CHIEF COMPLAINTS/PURPOSE OF CONSULTATION: LUNG cancer   Oncology History   No history exists.   # # AUG 2021- RUL lung mass/cancer [inidental;~3cm; PET- SUV- 3.5] [more consolidated from Jan 2017]; no biopsy;  status post SBRT [finished September 2021];   #2021 acute cholecystitis-s/p cholecystostomy drain [Dr. Byrnett]  # June 2020- stroke left side of body;; Aifb [on Eliquis]  HISTORY OF PRESENTING ILLNESS:  Jose Castro 87 y.o.  male with above history returns to clinic for follow up. He is s/p SBRT to posterior portion of right upper lobe of lung for suspected stage I non-small cell lung cancer.  Patient is accompanied by his daughter who provides majority of history. He has mild chronic cough which is unchanged. Seems to be feeling well. No abnormal weight loss, fevers, chills, night sweats. He is working with physical therapy.   Review of Systems  Constitutional:  Negative for chills, fever, malaise/fatigue and weight loss.  HENT:  Negative for hearing loss, nosebleeds, sore throat and tinnitus.   Eyes:  Negative for blurred vision and double vision.  Respiratory:  Negative for cough, hemoptysis, shortness of breath and wheezing.   Cardiovascular:  Negative for chest pain, palpitations and leg swelling.  Gastrointestinal:  Negative for abdominal pain, blood in stool, constipation, diarrhea, melena, nausea and vomiting.  Genitourinary:   Negative for dysuria and urgency.  Musculoskeletal:  Negative for back pain, falls, joint pain and myalgias.  Skin:  Negative for itching and rash.  Neurological:  Negative for dizziness, tingling, sensory change, loss of consciousness, weakness and headaches.  Endo/Heme/Allergies:  Negative for environmental allergies. Does not bruise/bleed easily.  Psychiatric/Behavioral:  Negative for depression. The patient is not nervous/anxious and does not have insomnia.     MEDICAL HISTORY:  Past Medical History:  Diagnosis Date   Allergy    Penicillin   Arthritis    Basal cell carcinoma    BPH (benign prostatic hyperplasia)    Chronic kidney disease    had a kidney stone which per family was a cyst that was removed   Dysrhythmia    GERD (gastroesophageal reflux disease)    History of kidney stones    Hyperlipidemia    Hypertension    Left-sided low back pain with left-sided sciatica    Lung mass    Splenic vein thrombosis    Stroke ([place]) 05/22/2019   Thrombosis 12/2015   mural  area and no notation of heart attack    SURGICAL HISTORY: Past Surgical History:  Procedure Laterality Date   APPENDECTOMY     CATARACT EXTRACTION, BILATERAL     CYSTOSCOPY WITH INSERTION OF UROLIFT     ENDOSCOPIC RETROGRADE CHOLANGIOPANCREATOGRAPHY (ERCP) WITH PROPOFOL N/A 05/11/2020   Procedure: ENDOSCOPIC RETROGRADE CHOLANGIOPANCREATOGRAPHY (ERCP) WITH PROPOFOL;  Surgeon: [name], MD;  Location: [facility] ENDOSCOPY;  Service: Endoscopy;  Laterality: N/A;   ERCP N/A 10/23/2020   Procedure: ENDOSCOPIC RETROGRADE CHOLANGIOPANCREATOGRAPHY (ERCP);  Surgeon: [name], MD;  Location: [facility] ENDOSCOPY;  Service: Endoscopy;  Laterality: N/A;   EYE SURGERY  08/2018  IR EXCHANGE BILIARY DRAIN  05/04/2020   KIDNEY STONE SURGERY     KNEE SURGERY     PACEMAKER INSERTION N/A 07/13/2018   Procedure: INSERTION PACEMAKER-DUEL CHAMBER INITIAL IMPLANT;  Surgeon: Isaias Cowman, MD;  Location: [facility] ORS;  Service:  Cardiovascular;  Laterality: N/A;   SPINE SURGERY      SOCIAL HISTORY: Social History   Socioeconomic History   Marital status: Widowed    Spouse name: Not on file   Number of children: 6   Years of education: Not on file   Highest education level: Some college, no degree  Occupational History   Occupation: retired    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  Tobacco Use   Smoking status: Former    Packs/day: 0.00    Years: 16.00    Pack years: 0.00    Types: Cigarettes    Start date: 10/21/1944    Quit date: 10/21/1960    Years since quitting: 60.7   Smokeless tobacco: Never  Vaping Use   Vaping Use: Never used  Substance and Sexual Activity   Alcohol use: Yes    Alcohol/week: 3.0 standard drinks    Types: 3 Standard drinks or equivalent per week    Comment: once a week when he goes out to dinner-socially   Drug use: No   Sexual activity: Yes    Partners: Female  Other Topics Concern   Not on file  Social History Narrative   Widow, lives alone but 4 of his children rotate staying with him since July 2020 due to stroke and he is not left alone but does most things independently.       Smoke 15-20 years; quit 60 years ago. Rare alcohol. 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    Social Determinants of Health   Financial Resource Strain: Low Risk    Difficulty of Paying Living Expenses: Not hard at all  Food Insecurity: No Food Insecurity   Worried About Charity fundraiser in the Last Year: Never true   [place] in the Last Year: Never true  Transportation Needs: No Transportation Needs   Lack of Transportation (Medical): No   Lack of Transportation (Non-Medical): No  Physical Activity: Inactive   Days of Exercise per Week: 0 days   Minutes of Exercise per Session: 0 min  Stress: No Stress Concern Present   Feeling of Stress : Not at all  Social Connections: Moderately Integrated   Frequency of Communication with Friends and Family: More than three times a week    Frequency of Social Gatherings with Friends and Family: More than three times a week   Attends Religious Services: More than 4 times per year   Active Member of [employer] or Organizations: Yes   Attends Archivist Meetings: More than 4 times per year   Marital Status: Widowed  Human resources officer Violence: Not At Risk   Fear of Current or Ex-Partner: No   Emotionally Abused: No   Physically Abused: No   Sexually Abused: No    FAMILY HISTORY: Family History  Problem Relation Age of Onset   Cancer Brother        bladder cancer with mets   Heart disease Mother    Aortic aneurysm Mother    Heart attack Maternal Aunt    Heart attack Maternal Uncle     ALLERGIES:  is allergic to penicillins.  MEDICATIONS:  Current Outpatient Medications  Medication Sig Dispense Refill   acetaminophen (TYLENOL)  325 MG tablet Take 2 tablets (650 mg total) by mouth every 4 (four) hours as needed for mild pain (or temp > 37.5 C (99.5 F)). (Patient taking differently: Take 650 mg by mouth as needed for mild pain (or temp > 37.5 C (99.5 F)).)     apixaban (ELIQUIS) 2.5 MG TABS tablet Take 1 tablet (2.5 mg total) by mouth 2 (two) times daily. 60 tablet 0   Cholecalciferol (VITAMIN D3) 50 MCG (2000 UT) TABS Take 2,000 Units by mouth daily. 30 tablet 0   Ensure Max Protein (ENSURE MAX PROTEIN) LIQD Take 330 mLs (11 oz total) by mouth 2 (two) times daily.     finasteride (PROSCAR) 5 MG tablet Take 1 tablet (5 mg total) by mouth daily. 90 tablet 1   metoprolol succinate (TOPROL XL) 25 MG 24 hr tablet Take 1 tablet (25 mg total) by mouth daily. 30 tablet 0   Omega-3 Fatty Acids (FISH OIL) 1000 MG CAPS Take 1,000 mg by mouth daily.      pantoprazole (PROTONIX) 40 MG tablet TAKE ONE TABLET EVERY DAY 90 tablet 3   rosuvastatin (CRESTOR) 40 MG tablet Take 1 tablet (40 mg total) by mouth daily at 6 PM. 30 tablet 0   ursodiol (ACTIGALL) 300 MG capsule Take 300 mg by mouth in the morning and at bedtime.     No  current facility-administered medications for this visit.      Marland Kitchen  PHYSICAL EXAMINATION: ECOG PERFORMANCE STATUS: 0 - Asymptomatic  Vitals:   06/25/21 1036  BP: 138/82  Pulse: 88  Resp: 18  Temp: 97.6 F (36.4 C)  SpO2: 96%   Filed Weights   06/25/21 1036  Weight: 156 lb (70.8 kg)    Physical Exam Constitutional:      Appearance: He is not ill-appearing.  Eyes:     General: No scleral icterus.    Conjunctiva/sclera: Conjunctivae normal.  Cardiovascular:     Rate and Rhythm: Normal rate and regular rhythm.  Abdominal:     General: There is no distension.     Palpations: Abdomen is soft.     Tenderness: There is no abdominal tenderness. There is no guarding.  Musculoskeletal:        General: No deformity.     Right lower leg: No edema.     Left lower leg: No edema.  Lymphadenopathy:     Cervical: No cervical adenopathy.  Skin:    General: Skin is warm and dry.  Neurological:     Mental Status: He is alert and oriented to person, place, and time. Mental status is at baseline.  Psychiatric:        Mood and Affect: Mood normal.        Behavior: Behavior normal.     LABORATORY DATA:  I have reviewed the data as listed Lab Results  Component Value Date   WBC 9.1 06/25/2021   HGB 13.2 06/25/2021   HCT 41.4 06/25/2021   MCV 94.7 06/25/2021   PLT 240 06/25/2021   Recent Labs    06/29/20 1953 06/30/20 1953 09/13/20 1034 12/20/20 1022 03/11/21 1419 06/21/21 1114 06/25/21 1017  NA  --  138   < > 135 141  --  137  K  --  3.4*   < > 4.0 5.3  --  4.3  CL  --  101   < > 98 106  --  103  CO2  --  28   < > 28 30  --  27  GLUCOSE  --  71   < > 87 93  --  93  BUN  --  25*   < > 21 21  --  29*  CREATININE  --  1.45*   < > 1.25* 1.18* 1.20 1.25*  CALCIUM  --  9.1   < > 8.8* 9.3  --  9.2  GFRNONAA  --  42*   < > 54* 53*  --  54*  GFRAA  --  48*  --   --  62  --   --   PROT 6.7  --   --  6.3* 5.9*  --  6.6  ALBUMIN 3.5  --   --  3.4*  --   --  4.0  AST 20  --    --  15 10  --  14*  ALT 18  --   --  9 4*  --  8  ALKPHOS 70  --   --  55  --   --  58  BILITOT 1.5*  --   --  0.9 0.6  --  1.0  BILIDIR 0.3*  --   --   --   --   --   --   IBILI 1.2*  --   --   --   --   --   --    < > = values in this interval not displayed.     RADIOGRAPHIC STUDIES: I have personally reviewed the radiological images as listed and agreed with the findings in the report. CT Chest W Contrast  Result Date: 06/23/2021 CLINICAL DATA:  Malignant neoplasm of lung, unspecified laterality, unspecified part of lung. Restaging non-small cell lung cancer. EXAM: CT CHEST WITH CONTRAST TECHNIQUE: Multidetector CT imaging of the chest was performed during intravenous contrast administration. CONTRAST:  6mL OMNIPAQUE IOHEXOL 300 MG/ML  SOLN COMPARISON:  12/19/2020. FINDINGS: Cardiovascular: Heart size is within normal limits. Aortic atherosclerosis. Coronary artery calcifications. Left chest wall pacer device noted with leads in the right atrial appendage and right ventricle. Mediastinum/Nodes: Stable 8 mm nodule in right lobe of thyroid gland, image 19/2. Not clinically significant; no follow-up imaging recommended (ref: J Am Coll Radiol. 2015 Feb;12(2): 143-50).The trachea appears patent and is midline. Normal appearance of the esophagus. No enlarged supraclavicular, axillary, mediastinal, or hilar lymph nodes. Lungs/Pleura: No pleural effusion. Progressive changes secondary to external beam radiation identified within the right upper lobe. There is now masslike architectural distortion and fibrosis within the right upper lobe, which has a, image 31/3 and image 66/6. The treated lung lesion is no longer measurable separate from these changes. -1.8 cm non solid nodule within the periphery of the right upper lobe is unchanged from previous exam, image 51/3 -0.8 cm non solid nodule within the right middle lobe is also unchanged, image 71/3. -0.3 cm sub solid nodule within the periphery of the right  middle lobe is unchanged, image 82/3. -0.4 cm nodule in the superior segment of the left lower lobe is stable, image 85/3. -2.2 cm non solid nodule in the superior segment of the right lower lobe is unchanged, image 65/3. -0.4 cm non solid nodule within the superior segment of right lower lobe is new, image 63/3. -0.5 cm nodule in the right lower lobe is unchanged, image 98/3. Upper Abdomen: No acute abnormality. Gallstones. Aortic atherosclerosis. The adrenal glands are unremarkable. Numerous bilateral kidney cysts of varying complexity are identified. For example within the upper pole of left kidney there  is a mildly complex cyst measuring 1.1 cm and 35 Hounsfield units, image 60/2. Unchanged from previous exam. Musculoskeletal: No acute or suspicious osseous findings. IMPRESSION: 1. Progressive changes secondary to external beam radiation identified within the right upper lobe. The treated lung lesion is no longer measurable separate from these changes. 2. Multiple non solid nodules are again seen within both lungs and are not significantly changed in size in the interval. Additionally, there are 2 new non solid nodules within the right lower lobe. At this time the largest non solid nodule is in the right upper lobe measuring 1.8 cm. Multifocal adenocarcinoma cannot be excluded. 3. Gallstones. 4. Numerous bilateral kidney cysts of varying complexity are again seen. Incompletely characterized by today's exam. 5. Coronary artery calcifications. 6. Aortic atherosclerosis. Aortic Atherosclerosis (ICD10-I70.0). Electronically Signed   By: Kerby Moors M.D.   On: 06/23/2021 14:46    ASSESSMENT & PLAN:   Stage I non small cell cancer of the right upper lobe of lung- ~3 cm, SUV 3.5, was slightly growing. Findings highly suspicious for malignancy and PET positivity. Not a candidate for chemotherapy or surgery. Underwent SBRT 08/25/20. CT was independently reviewed by myself and shows progressive changes likely  related to radiation. Treated lesions are no longer measurable. He has multiple nonsolid nodules that are stable and unchanged. However, he has 2 new nonsolid nodules in the RLL, largest measuring 1.8 cm. Etiology unclear and carcinoma cannot be excluded. Options including careful monitoring and repeating ct in 3 months vs PET were discussed. Patient's daughter wishes to get PET scan asap. They are meeting with Dr. Baruch Gouty today and will get his input as well.  2. Afib/stroke- on eliquis. Stable.    DISPOSITION:  Recommended follow up based on PET. Patient's daughter prefers to be followed by radiation oncology and they can consult medical oncology as needed for input.   No problem-specific Assessment & Plan notes found for this encounter.  All questions were answered. The patient knows to call the clinic with any problems, questions or concerns.   Verlon Au, NP 06/25/2021 3:41 PM  CC: Dr. Rogue Bussing

## 2021-06-27 ENCOUNTER — Ambulatory Visit: Payer: Medicare Other

## 2021-06-27 ENCOUNTER — Other Ambulatory Visit: Payer: Self-pay

## 2021-06-27 DIAGNOSIS — R278 Other lack of coordination: Secondary | ICD-10-CM

## 2021-06-27 DIAGNOSIS — R2681 Unsteadiness on feet: Secondary | ICD-10-CM

## 2021-06-27 DIAGNOSIS — R262 Difficulty in walking, not elsewhere classified: Secondary | ICD-10-CM

## 2021-06-27 DIAGNOSIS — R269 Unspecified abnormalities of gait and mobility: Secondary | ICD-10-CM

## 2021-06-27 DIAGNOSIS — M6281 Muscle weakness (generalized): Secondary | ICD-10-CM

## 2021-06-27 NOTE — Therapy (Signed)
[place] [facility] [facility] SERVICES [address] [place], [place], [address] Phone: [phone]   Fax:  [phone]  Physical Therapy Treatment  Patient Details  Name: Jose Castro MRN: [id number] Date of Birth: 11/03/1926 Referring Provider (PT): Dr. Ancil Boozer   Encounter Date: 06/27/2021   PT End of Session - 06/27/21 1348     Visit Number 26    Number of Visits 47    Date for PT Re-Evaluation 08/05/21    Authorization Type eval: 09/20/20, Progress note 01/02/2021, PN on 08/06/2228, Recert = 7/98/9211-9/41/7408; PN on 4/202022; PN on 12/04/4816; Recert= 5/63/14970-01/07/3784; PN on 06/11/21;    PT Start Time 1339    PT Stop Time 1420    PT Time Calculation (min) 41 min    Equipment Utilized During Treatment Gait belt    Activity Tolerance Patient tolerated treatment well    Behavior During Therapy WFL for tasks assessed/performed             Past Medical History:  Diagnosis Date   Allergy    Penicillin   Arthritis    Basal cell carcinoma    BPH (benign prostatic hyperplasia)    Chronic kidney disease    had a kidney stone which per family was a cyst that was removed   Dysrhythmia    GERD (gastroesophageal reflux disease)    History of kidney stones    Hyperlipidemia    Hypertension    Left-sided low back pain with left-sided sciatica    Lung mass    Splenic vein thrombosis    Stroke ([place]) 05/22/2019   Thrombosis 12/2015   mural  area and no notation of heart attack    Past Surgical History:  Procedure Laterality Date   APPENDECTOMY     CATARACT EXTRACTION, BILATERAL     CYSTOSCOPY WITH INSERTION OF UROLIFT     ENDOSCOPIC RETROGRADE CHOLANGIOPANCREATOGRAPHY (ERCP) WITH PROPOFOL N/A 05/11/2020   Procedure: ENDOSCOPIC RETROGRADE CHOLANGIOPANCREATOGRAPHY (ERCP) WITH PROPOFOL;  Surgeon: [name], MD;  Location: [facility] ENDOSCOPY;  Service: Endoscopy;  Laterality: N/A;   ERCP N/A 10/23/2020   Procedure: ENDOSCOPIC RETROGRADE  CHOLANGIOPANCREATOGRAPHY (ERCP);  Surgeon: [name], MD;  Location: [facility] ENDOSCOPY;  Service: Endoscopy;  Laterality: N/A;   EYE SURGERY  08/2018   IR EXCHANGE BILIARY DRAIN  05/04/2020   KIDNEY STONE SURGERY     KNEE SURGERY     PACEMAKER INSERTION N/A 07/13/2018   Procedure: INSERTION PACEMAKER-DUEL CHAMBER INITIAL IMPLANT;  Surgeon: Isaias Cowman, MD;  Location: [facility] ORS;  Service: Cardiovascular;  Laterality: N/A;   SPINE SURGERY      There were no vitals filed for this visit.   Subjective Assessment - 06/27/21 1345     Subjective Patient reports doing fairly good today with no new complaints. Reports his knee is sore but no worse than "usual"    Patient is accompained by: Family member    Pertinent History Pt referred for deconditioning and gait instability. He has had a decline since he last finished therapy. Pt had a hospital admission from 05/02/20 to 05/14/20 for acute cholecystitis and is now s/p cholecystostomy drain placement. Drain is still in place and pt is waiting to hear when they plan to remove it. Pt discharged to SNF and then returned home. In addition pt underwent radiation therapy for a RUL lung mass/cancer and had his last radiation treatment in September of 2021. He reports that he has continued to walk at home as his primary exercise. Pt was  previously seen at this clinic for PT/OT due to weakness/imbalance s/p CVA. Prior history from 09/21/19: Pt is a 87 year old male who presents with imbalance and difficulty with gait following a right frontal CVA with left hemiparesis on 05/22/19.  He has completed inpatient and home health PT, and now presents for OP PT.  He was discharged from inpatient rehab at a supervision level of assistance, ambulating with a RW.  His family notes that he is impulsive and will try to ambulate without his RW at home.  Family is currently providing 24/7 supervision.    Limitations House hold activities;Walking    How long can you sit  comfortably? no limitations    How long can you walk comfortably? he requires a RW    Patient Stated Goals Pt wants to be able to walk his granddaughter down the aisle at her wedding November 2022    Currently in Pain? Yes    Pain Score 4     Pain Location Knee    Pain Orientation Left;Anterior    Pain Descriptors / Indicators Aching;Sore    Pain Type Chronic pain    Pain Onset More than a month ago    Pain Frequency Constant    Aggravating Factors  Prolonged standing/walking    Pain Relieving Factors Rest    Effect of Pain on Daily Activities Decreased standing/walking    Multiple Pain Sites No                 Interventions   Therapeutic exercises:   Nustep L3 LE only x 6 min. Patient achieved > 80 SPM with VC and no report of left knee pain- for improved flexibility and cardiovascular endurance.  Neuromuscular Re-ed:   One foot on puple pad and the next on the step in front (staggered stance) x 20 sec x 3 trials each leg - Patient with intermittent reaching for // Bars .   Standing on purple pad and dynamic march up and tap 6" step x 12 reps each leg. Close CGA while weight bearing through Left LE and patient exhibited intermittent reaching for bar for balance.   Static stand on 1/2 white foam without UE support - A/P sway- difficulty with retro lean and difficulty correcting despite VC to stand erect for improved COM.   Forward/backward step up and over 1/2 white foam roll wihtout UE support in // bars- Patient with more difficulty with backward steps requiring close CGA today.   Side stepping over 3 foam rolls- Use of 1 UE for support x 6 trials down and back- difficulty with standing on left LE with 2 episodes on left knee buckling.   Seated HS stretch- hold 20 sec x 3 sets  Seated LAQ left LE x 15 reps.    Dynamic walking: 320 feet with 4WW, CGA  with improved left LE step length and no evidence of dragging foot today.  Education provided throughout session via  VC/TC and demonstration to facilitate movement at target joints and correct muscle activation for all testing and exercises performed.   Clinical Impression: Patient challenged with all balance activities today exhbiting fair ability to stand on left LE while performing some dynamic movement with right LE. He did improved some with practice but requires CGA for safety today. He was able to demo improved gait during training today with only VC to increase left LE step and not drag his foot. Pt will benefit from further skilled PT to improve BLE strength, balance and functional mobility.  PT Education - 06/27/21 1347     Education Details Specific body mechanics, transfer safety, exercise technique    Person(s) Educated Patient    Methods Explanation;Demonstration;Tactile cues;Verbal cues    Comprehension Verbalized understanding;Returned demonstration;Verbal cues required;Tactile cues required;Need further instruction              PT Short Term Goals - 01/21/21 1438       PT SHORT TERM GOAL #1   Title Pt will be independent with HEP in order to improve strength and balance in order to decrease fall risk and improve function at home.    Baseline 01/02/2021- Patient verbalize understanding of walking and seated exercises for home program. 01/21/21 patient has been more active and understands HEP.    Time 6    Period Weeks    Status Achieved    Target Date 01/10/21               PT Long Term Goals - 06/11/21 1458       PT LONG TERM GOAL #1   Title Pt will improve BERG by at least 3 points in order to demonstrate clinically significant improvement in balance.    Baseline 09/20/20: 32/56 (Previous discharge: 03/06/20: 46/56), 12/14: 32/56, 1/13: 31/56, 01/16/21 35/56, 02/06/2021=36/56. 02/18/2021= 37/56.03/20/2021- Deferred secondary to left knee aching- will attempt next visit. 05/06/2021= 39/56; 05/13/2021= 36/56 *patient having more left knee pain.     Time 12    Period Weeks    Status Achieved      PT LONG TERM GOAL #2   Title Pt will decrease TUG to below 14 seconds/decrease in order to demonstrate decreased fall risk.    Baseline 09/20/20: 19.2s, 12/14: 17.48, 1/13: 16.65 sec. 01/02/2021=15.75 sec using 4WW, 01/21/21  14.37 seconds. 02/06/2021= 14.92 sec using 4WW. 02/18/2021= 14.5 sec using 4WW. 03/20/2021- Deferred secondary to left knee aching- will attempt next visit. 05/06/2021= 16.32 sec avg using 4WW. 05/13/2021= 16.5 sec with 4WW *increased left knee pain. 06/11/21: 20.11 seconds (increased time to turn) using 4WW;    Time 12    Period Weeks    Status On-going    Target Date 08/05/21      PT LONG TERM GOAL #3   Title Pt will decrease 5TSTS by at least 3 seconds in order to demonstrate clinically significant improvement in LE strength.    Baseline 09/20/20: 23.1s with minA+1 for strength and balance (Previous discharge 03/06/20: 16.1s), 12/14: 20 sec, 1/13: 22 sec.   01/02/2021= 16.05 sec with light UE support. 01/21/21 01/21/21 37.90 seconds without UE, with UE support 12.93 seconds: 02/06/2021= with 1 UE 17.93 sec. 02/18/2021= 21.33 sec without UE support. 05/06/2021= 15.3 sec with BUE Support and 21. 0 sec without UE support. 05/13/2021=15.0 sec with BUE support; 16 sec without UE support.    Time 12    Period Weeks    Status Achieved      PT LONG TERM GOAL #4   Title Pt will increase his self-selected 10MWT to >0.74 m/s in order to demonstrate improvement in gait speed and improved community ambulation    Baseline 09/20/20: self-selected: 14.4s = 0.69 m/s with rollator (Previous discharge 03/06/20: self-selected: 13.6s = 0.74 m/s), 12/14: 0.75 m/s, 1/13: 0.78 m/s. 01/02/2021= 0.85 m/s using 4WW, 01/16/21 with 4 WW .99 m/s. 02/06/2021= self selected = 0.93 m/s with 8FO    Time 4    Period Weeks    Status Achieved      PT LONG TERM GOAL #5   Title  Patient will increase six minute walk test distance to >1000 for progression to community ambulator and  improve gait ability    Baseline 01/10/20  620 ft with rollator, 01/21/21  965 ft; 02/06/2021= 910 feet with 4WW.  02/18/2021= 950 feet with 4WW. 05/06/2021= will test next visit. 05/13/2021- Deferred secondary to increased left knee pain. 06/11/21: 720ft with 5IO *knee pain throughout test.    Time 12    Period Weeks    Status On-going    Target Date 08/05/21      PT LONG TERM GOAL #6   Title Patient will demonstrate ability to walk with HHA +1 > 200 ft. without loss of balance on level surfaces to simulate walking his grandaughter down the aisle for her upcoming wedding in November 2022.    Baseline 05/13/2021- Patient currently ambulating short community distance with use of 4WW but desires to walk with his grandaughter with HHA for her wedding. 06/11/21: attempted 5 steps with HHA x1, deemed unsafe.    Time 12    Period Weeks    Status On-going    Target Date 08/05/21      PT LONG TERM GOAL #7   Title Pt will decrease 5TSTS  to 14 sec or less with BUE support on armrest in order to demonstrate clinically significant improvement in LE strength.    Baseline 05/13/2021=5.0 sec with BUE support; 16 sec without UE support. 06/11/21: 14.61 seconds with BUE support.    Time 12    Period Weeks    Status On-going    Target Date 08/05/21                   Plan - 06/27/21 1348     Clinical Impression Statement Patient challenged with all balance activities today exhbiting fair ability to stand on left LE while performing some dynamic movement with right LE. He did improved some with practice but requires CGA for safety today. He was able to demo improved gait during training today with only VC to increase left LE step and not drag his foot. Pt will benefit from further skilled PT to improve BLE strength, balance and functional mobility.    Personal Factors and Comorbidities Age;Comorbidity 3+    Comorbidities HTN, CVA, cholecystitis with cholecystostomy drain    Examination-Activity Limitations  Bend;Lift;Squat;Locomotion Level;Stairs;Stand    Examination-Participation Restrictions Community Activity;Driving;Laundry;Meal Prep;Shop    Stability/Clinical Decision Making Unstable/Unpredictable    Rehab Potential Fair    PT Frequency 2x / week    PT Duration 12 weeks    PT Treatment/Interventions ADLs/Self Care Home Management;Cryotherapy;Electrical Stimulation;Iontophoresis 4mg /ml Dexamethasone;Moist Heat;Traction;Ultrasound;DME Instruction;Gait training;Stair training;Therapeutic activities;Therapeutic exercise;Functional mobility training;Balance training;Neuromuscular re-education;Patient/family education;Manual techniques;Dry needling;Vestibular;Joint Manipulations;Spinal Manipulations;Aquatic Therapy    PT Next Visit Plan Continue to focus on LE strengthening and balance, dynamic balance tasks    PT Home Exercise Plan no changes    Consulted and Agree with Plan of Care Patient             Patient will benefit from skilled therapeutic intervention in order to improve the following deficits and impairments:  Abnormal gait, Decreased balance, Decreased mobility, Difficulty walking, Decreased knowledge of precautions, Decreased safety awareness, Decreased activity tolerance, Decreased strength, Decreased endurance  Visit Diagnosis: Abnormality of gait and mobility  Difficulty in walking, not elsewhere classified  Muscle weakness (generalized)  Unsteadiness on feet  Other lack of coordination     Problem List Patient Active Problem List   Diagnosis Date Noted   Moderate protein-calorie malnutrition (  [place]) 03/11/2021   Hemiparesis affecting left side as late effect of cerebrovascular accident (CVA) ([place]) 03/11/2021   Aneurysm, aorta, thoracic ([place]) 12/21/2020   Calculus of bile duct without cholecystitis and without obstruction    Non-small cell carcinoma of lung, right ([place]) 09/07/2020   Mass of upper lobe of right lung 07/13/2020   Calculus of bile duct with  cholecystitis without obstruction    Abnormal findings on imaging of biliary tract    Acute cholecystitis 05/02/2020   Chronic venous insufficiency 12/05/2019   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) 06/15/2018   Leg pain 05/06/2018   Lymphedema 05/06/2018   Coagulopathy ([place]) 07/07/2017   Lumbar spondylosis 03/10/2017   Elevated uric acid in blood 11/20/2016   BPH (benign prostatic hyperplasia) 05/29/2016   Peripheral vascular disease of lower extremity ([place]) 03/03/2016   Gallstone 12/31/2015   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   Atherosclerosis of aorta ([place]) 11/22/2015   Carotid artery narrowing 11/22/2015   Diverticulosis of colon 11/22/2015   Decreased creatinine clearance 11/22/2015   Arthritis, degenerative 11/22/2015   Lactose intolerance 11/22/2015   Basal cell carcinoma 11/22/2015   Essential hypertension 06/21/2015   Hyperlipemia 06/21/2015   GERD (gastroesophageal reflux disease) 06/21/2015   Calculus of kidney 11/18/2013    Lewis Moccasin, PT 06/27/2021, 4:46 PM  [place] MAIN [facility] SERVICES [address] [place], [place], [address] Phone: [phone]   Fax:  [phone]  Name: DARRIUS MONTANO MRN: [id number] Date of Birth: May 27, 1926

## 2021-07-02 ENCOUNTER — Other Ambulatory Visit: Payer: Self-pay

## 2021-07-02 ENCOUNTER — Ambulatory Visit: Payer: Medicare Other | Attending: Family Medicine

## 2021-07-02 DIAGNOSIS — R278 Other lack of coordination: Secondary | ICD-10-CM | POA: Insufficient documentation

## 2021-07-02 DIAGNOSIS — M79674 Pain in right toe(s): Secondary | ICD-10-CM | POA: Diagnosis present

## 2021-07-02 DIAGNOSIS — M25511 Pain in right shoulder: Secondary | ICD-10-CM | POA: Diagnosis present

## 2021-07-02 DIAGNOSIS — R29898 Other symptoms and signs involving the musculoskeletal system: Secondary | ICD-10-CM | POA: Diagnosis present

## 2021-07-02 DIAGNOSIS — R2681 Unsteadiness on feet: Secondary | ICD-10-CM | POA: Diagnosis present

## 2021-07-02 DIAGNOSIS — R262 Difficulty in walking, not elsewhere classified: Secondary | ICD-10-CM | POA: Insufficient documentation

## 2021-07-02 DIAGNOSIS — M6281 Muscle weakness (generalized): Secondary | ICD-10-CM | POA: Diagnosis present

## 2021-07-02 DIAGNOSIS — R2689 Other abnormalities of gait and mobility: Secondary | ICD-10-CM | POA: Insufficient documentation

## 2021-07-02 DIAGNOSIS — R5383 Other fatigue: Secondary | ICD-10-CM | POA: Insufficient documentation

## 2021-07-02 DIAGNOSIS — R269 Unspecified abnormalities of gait and mobility: Secondary | ICD-10-CM | POA: Insufficient documentation

## 2021-07-02 DIAGNOSIS — I63511 Cerebral infarction due to unspecified occlusion or stenosis of right middle cerebral artery: Secondary | ICD-10-CM | POA: Diagnosis present

## 2021-07-02 NOTE — Therapy (Signed)
[place] MAIN [facility] SERVICES [address] [place], [place], [address] Phone: [phone]   Fax:  [phone]  Physical Therapy Treatment  Patient Details  Name: Jose Castro MRN: [id number] Date of Birth: January 26, 1926 Referring Provider (PT): Dr. Ancil Boozer   Encounter Date: 07/02/2021   PT End of Session - 07/02/21 1405     Visit Number 58    Number of Visits 60    Date for PT Re-Evaluation 08/05/21    Authorization Type eval: 09/20/20, Progress note 01/02/2021, PN on 12/09/4172, Recert = 0/81/4481-8/56/3149; PN on 4/202022; PN on 7/0/2637; Recert= 8/58/85027-06/03/1286; PN on 06/11/21;    PT Start Time 1348    PT Stop Time 1433    PT Time Calculation (min) 45 min    Equipment Utilized During Treatment Gait belt    Activity Tolerance Patient tolerated treatment well    Behavior During Therapy WFL for tasks assessed/performed             Past Medical History:  Diagnosis Date   Allergy    Penicillin   Arthritis    Basal cell carcinoma    BPH (benign prostatic hyperplasia)    Chronic kidney disease    had a kidney stone which per family was a cyst that was removed   Dysrhythmia    GERD (gastroesophageal reflux disease)    History of kidney stones    Hyperlipidemia    Hypertension    Left-sided low back pain with left-sided sciatica    Lung mass    Splenic vein thrombosis    Stroke ([place]) 05/22/2019   Thrombosis 12/2015   mural  area and no notation of heart attack    Past Surgical History:  Procedure Laterality Date   APPENDECTOMY     CATARACT EXTRACTION, BILATERAL     CYSTOSCOPY WITH INSERTION OF UROLIFT     ENDOSCOPIC RETROGRADE CHOLANGIOPANCREATOGRAPHY (ERCP) WITH PROPOFOL N/A 05/11/2020   Procedure: ENDOSCOPIC RETROGRADE CHOLANGIOPANCREATOGRAPHY (ERCP) WITH PROPOFOL;  Surgeon: [name], MD;  Location: [facility] ENDOSCOPY;  Service: Endoscopy;  Laterality: N/A;   ERCP N/A 10/23/2020   Procedure: ENDOSCOPIC RETROGRADE  CHOLANGIOPANCREATOGRAPHY (ERCP);  Surgeon: [name], MD;  Location: [facility] ENDOSCOPY;  Service: Endoscopy;  Laterality: N/A;   EYE SURGERY  08/2018   IR EXCHANGE BILIARY DRAIN  05/04/2020   KIDNEY STONE SURGERY     KNEE SURGERY     PACEMAKER INSERTION N/A 07/13/2018   Procedure: INSERTION PACEMAKER-DUEL CHAMBER INITIAL IMPLANT;  Surgeon: Isaias Cowman, MD;  Location: [facility] ORS;  Service: Cardiovascular;  Laterality: N/A;   SPINE SURGERY      There were no vitals filed for this visit.   Subjective Assessment - 07/02/21 1356     Subjective Patient reports having some pectoral tightness yesterday but feeling okay today.    Patient is accompained by: Family member    Pertinent History Pt referred for deconditioning and gait instability. He has had a decline since he last finished therapy. Pt had a hospital admission from 05/02/20 to 05/14/20 for acute cholecystitis and is now s/p cholecystostomy drain placement. Drain is still in place and pt is waiting to hear when they plan to remove it. Pt discharged to SNF and then returned home. In addition pt underwent radiation therapy for a RUL lung mass/cancer and had his last radiation treatment in September of 2021. He reports that he has continued to walk at home as his primary exercise. Pt was previously seen at this clinic for PT/OT due to  weakness/imbalance s/p CVA. Prior history from 09/21/19: Pt is a 87 year old male who presents with imbalance and difficulty with gait following a right frontal CVA with left hemiparesis on 05/22/19.  He has completed inpatient and home health PT, and now presents for OP PT.  He was discharged from inpatient rehab at a supervision level of assistance, ambulating with a RW.  His family notes that he is impulsive and will try to ambulate without his RW at home.  Family is currently providing 24/7 supervision.    Limitations House hold activities;Walking    How long can you sit comfortably? no limitations    How long can  you walk comfortably? he requires a RW    Patient Stated Goals Pt wants to be able to walk his granddaughter down the aisle at her wedding November 2022    Currently in Pain? Yes    Pain Score 4     Pain Location Knee    Pain Orientation Left;Anterior    Pain Descriptors / Indicators Aching    Pain Type Chronic pain    Pain Onset More than a month ago    Pain Frequency Constant    Aggravating Factors  Prolonged standing/walking    Pain Relieving Factors Rest    Effect of Pain on Daily Activities Limited standing/walking    Multiple Pain Sites No              Interventions:   Therapeutic exercises:  Nustep L2 LE only x 5 min  Sit to stand from various heights on mat table  26"- 10 reps 24"- 10reps  22"- 10 reps  Gait trainer: x 6 min total- BUE Support at 0.53 m/s- Constant VC    Neuromuscular re-ed:  Standing Static on blue airex pad with feet apart and no UE support x 20 sec x 3 trials- No LOB or reaching for furniture.  Static stand - blue airex pad - feet together and no UE support x 20 sec x 3 trials Static stand- blue airex pad- Feet staggered and No UE support x 20 sec x 3 trials. Static stand on 1/2 white foam roll - tandem each LE x 20 sec x 3 trials each. Dynamic marching on blue airex pad - intermittent reaching for support bar with minimal height with marching.  Dynamic marching on firm floor- no loss of balance yet cues to try to increase height of march.   Education provided throughout session via VC/TC and demonstration to facilitate movement at target joints and correct muscle activation for all testing and exercises performed.   Clinical Impression: Patient performed much better overall with all standing balance activities today requiring less reaching for UE support and increased steadiness. He continues to require constant VC for Posture and denied any increase in left knee ROM today. Pt will benefit from further skilled PT to improve BLE strength,  balance and functional mobility                         PT Education - 07/03/21 1350     Education Details Exercise technique, Postural Technique    Person(s) Educated Patient    Methods Explanation;Demonstration;Tactile cues;Verbal cues    Comprehension Verbalized understanding;Returned demonstration;Verbal cues required;Tactile cues required;Need further instruction              PT Short Term Goals - 01/21/21 1438       PT SHORT TERM GOAL #1   Title Pt will be independent  with HEP in order to improve strength and balance in order to decrease fall risk and improve function at home.    Baseline 01/02/2021- Patient verbalize understanding of walking and seated exercises for home program. 01/21/21 patient has been more active and understands HEP.    Time 6    Period Weeks    Status Achieved    Target Date 01/10/21               PT Long Term Goals - 06/11/21 1458       PT LONG TERM GOAL #1   Title Pt will improve BERG by at least 3 points in order to demonstrate clinically significant improvement in balance.    Baseline 09/20/20: 32/56 (Previous discharge: 03/06/20: 46/56), 12/14: 32/56, 1/13: 31/56, 01/16/21 35/56, 02/06/2021=36/56. 02/18/2021= 37/56.03/20/2021- Deferred secondary to left knee aching- will attempt next visit. 05/06/2021= 39/56; 05/13/2021= 36/56 *patient having more left knee pain.    Time 12    Period Weeks    Status Achieved      PT LONG TERM GOAL #2   Title Pt will decrease TUG to below 14 seconds/decrease in order to demonstrate decreased fall risk.    Baseline 09/20/20: 19.2s, 12/14: 17.48, 1/13: 16.65 sec. 01/02/2021=15.75 sec using 4WW, 01/21/21  14.37 seconds. 02/06/2021= 14.92 sec using 4WW. 02/18/2021= 14.5 sec using 4WW. 03/20/2021- Deferred secondary to left knee aching- will attempt next visit. 05/06/2021= 16.32 sec avg using 4WW. 05/13/2021= 16.5 sec with 4WW *increased left knee pain. 06/11/21: 20.11 seconds (increased time to turn) using  4WW;    Time 12    Period Weeks    Status On-going    Target Date 08/05/21      PT LONG TERM GOAL #3   Title Pt will decrease 5TSTS by at least 3 seconds in order to demonstrate clinically significant improvement in LE strength.    Baseline 09/20/20: 23.1s with minA+1 for strength and balance (Previous discharge 03/06/20: 16.1s), 12/14: 20 sec, 1/13: 22 sec.   01/02/2021= 16.05 sec with light UE support. 01/21/21 01/21/21 37.90 seconds without UE, with UE support 12.93 seconds: 02/06/2021= with 1 UE 17.93 sec. 02/18/2021= 21.33 sec without UE support. 05/06/2021= 15.3 sec with BUE Support and 21. 0 sec without UE support. 05/13/2021=15.0 sec with BUE support; 16 sec without UE support.    Time 12    Period Weeks    Status Achieved      PT LONG TERM GOAL #4   Title Pt will increase his self-selected 10MWT to >0.74 m/s in order to demonstrate improvement in gait speed and improved community ambulation    Baseline 09/20/20: self-selected: 14.4s = 0.69 m/s with rollator (Previous discharge 03/06/20: self-selected: 13.6s = 0.74 m/s), 12/14: 0.75 m/s, 1/13: 0.78 m/s. 01/02/2021= 0.85 m/s using 4WW, 01/16/21 with 4 WW .99 m/s. 02/06/2021= self selected = 0.93 m/s with 7WY    Time 4    Period Weeks    Status Achieved      PT LONG TERM GOAL #5   Title Patient will increase six minute walk test distance to >1000 for progression to community ambulator and improve gait ability    Baseline 01/10/20  620 ft with rollator, 01/21/21  965 ft; 02/06/2021= 910 feet with 4WW.  02/18/2021= 950 feet with 4WW. 05/06/2021= will test next visit. 05/13/2021- Deferred secondary to increased left knee pain. 06/11/21: 722ft with 6VZ *knee pain throughout test.    Time 12    Period Weeks    Status On-going  Target Date 08/05/21      PT LONG TERM GOAL #6   Title Patient will demonstrate ability to walk with HHA +1 > 200 ft. without loss of balance on level surfaces to simulate walking his grandaughter down the aisle for her upcoming wedding  in November 2022.    Baseline 05/13/2021- Patient currently ambulating short community distance with use of 4WW but desires to walk with his grandaughter with HHA for her wedding. 06/11/21: attempted 5 steps with HHA x1, deemed unsafe.    Time 12    Period Weeks    Status On-going    Target Date 08/05/21      PT LONG TERM GOAL #7   Title Pt will decrease 5TSTS  to 14 sec or less with BUE support on armrest in order to demonstrate clinically significant improvement in LE strength.    Baseline 05/13/2021=5.0 sec with BUE support; 16 sec without UE support. 06/11/21: 14.61 seconds with BUE support.    Time 12    Period Weeks    Status On-going    Target Date 08/05/21                   Plan - 07/03/21 1358     Clinical Impression Statement Patient performed much better overall with all standing balance activities today requiring less reaching for UE support and increased steadiness. He continues to require constant VC for Posture and denied any increase in left knee ROM today. Pt will benefit from further skilled PT to improve BLE strength, balance and functional mobility.    Personal Factors and Comorbidities Age;Comorbidity 3+    Comorbidities HTN, CVA, cholecystitis with cholecystostomy drain    Examination-Activity Limitations Bend;Lift;Squat;Locomotion Level;Stairs;Stand    Examination-Participation Restrictions Community Activity;Driving;Laundry;Meal Prep;Shop    Stability/Clinical Decision Making Unstable/Unpredictable    Rehab Potential Fair    PT Frequency 2x / week    PT Duration 12 weeks    PT Treatment/Interventions ADLs/Self Care Home Management;Cryotherapy;Electrical Stimulation;Iontophoresis 4mg /ml Dexamethasone;Moist Heat;Traction;Ultrasound;DME Instruction;Gait training;Stair training;Therapeutic activities;Therapeutic exercise;Functional mobility training;Balance training;Neuromuscular re-education;Patient/family education;Manual techniques;Dry needling;Vestibular;Joint  Manipulations;Spinal Manipulations;Aquatic Therapy    PT Next Visit Plan Continue to focus on LE strengthening and balance, dynamic balance tasks    PT Home Exercise Plan no changes    Consulted and Agree with Plan of Care Patient             Patient will benefit from skilled therapeutic intervention in order to improve the following deficits and impairments:  Abnormal gait, Decreased balance, Decreased mobility, Difficulty walking, Decreased knowledge of precautions, Decreased safety awareness, Decreased activity tolerance, Decreased strength, Decreased endurance  Visit Diagnosis: Abnormality of gait and mobility  Difficulty in walking, not elsewhere classified  Muscle weakness (generalized)  Other abnormalities of gait and mobility  Other lack of coordination  Unsteadiness on feet     Problem List Patient Active Problem List   Diagnosis Date Noted   Localized osteoarthritis of knees, bilateral 06/13/2021   Moderate protein-calorie malnutrition ([employer]) 03/11/2021   Hemiparesis affecting left side as late effect of cerebrovascular accident (CVA) ([place]) 03/11/2021   Aneurysm, aorta, thoracic ([place]) 12/21/2020   Calculus of bile duct without cholecystitis and without obstruction    Non-small cell carcinoma of lung, right ([place]) 09/07/2020   Mass of upper lobe of right lung 07/13/2020   Calculus of bile duct with cholecystitis without obstruction    Abnormal findings on imaging of biliary tract    Acute cholecystitis 05/02/2020   Chronic venous insufficiency 12/05/2019  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    CKD (chronic kidney disease), stage II    New onset atrial fibrillation ([facility])    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   Goals of care, counseling/discussion    Palliative care by specialist    DNR (do not resuscitate) discussion    History of CVA (cerebrovascular accident) 05/22/2019   Persistent proteinuria 07/30/2018   M[place]itz type 2 second  degree heart block 07/13/2018   Chronic kidney disease, stage III (moderate) ([place]) 06/15/2018   Leg pain 05/06/2018   Lymphedema 05/06/2018   Coagulopathy ([place]) 07/07/2017   Lumbar spondylosis 03/10/2017   Elevated uric acid in blood 11/20/2016   BPH (benign prostatic hyperplasia) 05/29/2016   Peripheral vascular disease of lower extremity ([place]) 03/03/2016   Gallstone 12/31/2015   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   Atherosclerosis of aorta ([place]) 11/22/2015   Carotid artery narrowing 11/22/2015   Diverticulosis of colon 11/22/2015   Decreased creatinine clearance 11/22/2015   Arthritis, degenerative 11/22/2015   Lactose intolerance 11/22/2015   Basal cell carcinoma 11/22/2015   Essential hypertension 06/21/2015   Hyperlipemia 06/21/2015   GERD (gastroesophageal reflux disease) 06/21/2015   Calculus of kidney 11/18/2013    Lewis Moccasin, PT 07/03/2021, 2:14 PM  [place] MAIN [facility] SERVICES [address] [place], [place], [address] Phone: [phone]   Fax:  [phone]  Name: Jose Castro MRN: [id number] Date of Birth: 05-28-1926

## 2021-07-04 ENCOUNTER — Other Ambulatory Visit: Payer: Self-pay

## 2021-07-04 ENCOUNTER — Ambulatory Visit: Payer: Medicare Other

## 2021-07-04 DIAGNOSIS — M6281 Muscle weakness (generalized): Secondary | ICD-10-CM

## 2021-07-04 DIAGNOSIS — R262 Difficulty in walking, not elsewhere classified: Secondary | ICD-10-CM

## 2021-07-04 DIAGNOSIS — R2681 Unsteadiness on feet: Secondary | ICD-10-CM

## 2021-07-04 DIAGNOSIS — R269 Unspecified abnormalities of gait and mobility: Secondary | ICD-10-CM | POA: Diagnosis not present

## 2021-07-04 DIAGNOSIS — R278 Other lack of coordination: Secondary | ICD-10-CM

## 2021-07-04 NOTE — Therapy (Signed)
[place] MAIN [facility] SERVICES [address] [place], [place], [address] Phone: [phone]   Fax:  [phone]  Physical Therapy Treatment  Patient Details  Name: Jose Castro MRN: [id number] Date of Birth: 1926-02-12 Referring Provider (PT): Dr. Ancil Boozer   Encounter Date: 07/04/2021   PT End of Session - 07/04/21 1350     Visit Number 21    Number of Visits 21    Date for PT Re-Evaluation 08/05/21    Authorization Type eval: 09/20/20, Progress note 01/02/2021, PN on 02/02/3613, Recert = 4/31/5400-8/67/6195; PN on 4/202022; PN on 0/08/3266; Recert= 1/24/58099-07/03/3824; PN on 06/11/21;    PT Start Time 1345    PT Stop Time 1427    PT Time Calculation (min) 42 min    Equipment Utilized During Treatment Gait belt    Activity Tolerance Patient tolerated treatment well    Behavior During Therapy WFL for tasks assessed/performed             Past Medical History:  Diagnosis Date   Allergy    Penicillin   Arthritis    Basal cell carcinoma    BPH (benign prostatic hyperplasia)    Chronic kidney disease    had a kidney stone which per family was a cyst that was removed   Dysrhythmia    GERD (gastroesophageal reflux disease)    History of kidney stones    Hyperlipidemia    Hypertension    Left-sided low back pain with left-sided sciatica    Lung mass    Splenic vein thrombosis    Stroke ([place]) 05/22/2019   Thrombosis 12/2015   mural  area and no notation of heart attack    Past Surgical History:  Procedure Laterality Date   APPENDECTOMY     CATARACT EXTRACTION, BILATERAL     CYSTOSCOPY WITH INSERTION OF UROLIFT     ENDOSCOPIC RETROGRADE CHOLANGIOPANCREATOGRAPHY (ERCP) WITH PROPOFOL N/A 05/11/2020   Procedure: ENDOSCOPIC RETROGRADE CHOLANGIOPANCREATOGRAPHY (ERCP) WITH PROPOFOL;  Surgeon: [name], MD;  Location: [facility] ENDOSCOPY;  Service: Endoscopy;  Laterality: N/A;   ERCP N/A 10/23/2020   Procedure: ENDOSCOPIC RETROGRADE  CHOLANGIOPANCREATOGRAPHY (ERCP);  Surgeon: [name], MD;  Location: [facility] ENDOSCOPY;  Service: Endoscopy;  Laterality: N/A;   EYE SURGERY  08/2018   IR EXCHANGE BILIARY DRAIN  05/04/2020   KIDNEY STONE SURGERY     KNEE SURGERY     PACEMAKER INSERTION N/A 07/13/2018   Procedure: INSERTION PACEMAKER-DUEL CHAMBER INITIAL IMPLANT;  Surgeon: Isaias Cowman, MD;  Location: [facility] ORS;  Service: Cardiovascular;  Laterality: N/A;   SPINE SURGERY      There were no vitals filed for this visit.   Subjective Assessment - 07/04/21 1349     Subjective Patient reports feeling pretty decent today.    Patient is accompained by: Family member    Pertinent History Pt referred for deconditioning and gait instability. He has had a decline since he last finished therapy. Pt had a hospital admission from 05/02/20 to 05/14/20 for acute cholecystitis and is now s/p cholecystostomy drain placement. Drain is still in place and pt is waiting to hear when they plan to remove it. Pt discharged to SNF and then returned home. In addition pt underwent radiation therapy for a RUL lung mass/cancer and had his last radiation treatment in September of 2021. He reports that he has continued to walk at home as his primary exercise. Pt was previously seen at this clinic for PT/OT due to weakness/imbalance s/p CVA. Prior history  from 09/21/19: Pt is a 87 year old male who presents with imbalance and difficulty with gait following a right frontal CVA with left hemiparesis on 05/22/19.  He has completed inpatient and home health PT, and now presents for OP PT.  He was discharged from inpatient rehab at a supervision level of assistance, ambulating with a RW.  His family notes that he is impulsive and will try to ambulate without his RW at home.  Family is currently providing 24/7 supervision.    Limitations House hold activities;Walking    How long can you sit comfortably? no limitations    How long can you walk comfortably? he requires a  RW    Patient Stated Goals Pt wants to be able to walk his granddaughter down the aisle at her wedding November 2022    Currently in Pain? Yes    Pain Score 2     Pain Location Knee    Pain Orientation Left;Anterior    Pain Descriptors / Indicators Aching    Pain Type Chronic pain    Pain Onset More than a month ago    Pain Frequency Constant    Aggravating Factors  prolonged standing/walking    Pain Relieving Factors Rest    Effect of Pain on Daily Activities Limited Standing/walking    Multiple Pain Sites No               Interventions:  Therapeutic Exercises:   Nustep LE only x 5 mins - VC to keep SPM > 70- Patient performed well yet easily distracted requiring reminders   Gait trainer on [employer] TM  5 min at 0.5 m/s Time on each foot L/R= 51/49% Step length= 0.44 m L/0.40 m R   Standing at wall (Self ball toss against wall- chest height and then overhead x 20 tosses each level x 2 sets today). Patient required VC for erect posture yet able to perform overhead and catch and toss ball well today.   Wall postural activities:  Standing with back against wall- chin retraction x 10 reps. Standing with back against wall- UE forward elevation holding onto ball x 15 reps.  Standing with back against wall- Horizontal ABD (unilateral with 1 hand holding red ball then switch to opp side)  x 15 reps each.  Standing with back against wall- D2 shoulder Flex x 15 reps each UE Sit to stand without UE support from straight back chair x 10 reps with VC to scoot out to edge and to lean forward.  Education provided throughout session via VC/TC and demonstration to facilitate movement at target joints and correct muscle activation for all testing and exercises performed.   Clinical Impression: Patient able to demo improved overall step length on TM with VC today and able to stand more erect. He was also able to demon improved standing balance while performing UE activity (even overhead)  today. Pt will benefit from further skilled PT to improve BLE strength, balance and functional mobility.                        PT Education - 07/04/21 1350     Education Details Exercise and posture techniques    Person(s) Educated Patient    Methods Explanation;Demonstration;Tactile cues;Verbal cues    Comprehension Verbalized understanding;Returned demonstration;Verbal cues required;Tactile cues required;Need further instruction              PT Short Term Goals - 01/21/21 1438       PT  SHORT TERM GOAL #1   Title Pt will be independent with HEP in order to improve strength and balance in order to decrease fall risk and improve function at home.    Baseline 01/02/2021- Patient verbalize understanding of walking and seated exercises for home program. 01/21/21 patient has been more active and understands HEP.    Time 6    Period Weeks    Status Achieved    Target Date 01/10/21               PT Long Term Goals - 06/11/21 1458       PT LONG TERM GOAL #1   Title Pt will improve BERG by at least 3 points in order to demonstrate clinically significant improvement in balance.    Baseline 09/20/20: 32/56 (Previous discharge: 03/06/20: 46/56), 12/14: 32/56, 1/13: 31/56, 01/16/21 35/56, 02/06/2021=36/56. 02/18/2021= 37/56.03/20/2021- Deferred secondary to left knee aching- will attempt next visit. 05/06/2021= 39/56; 05/13/2021= 36/56 *patient having more left knee pain.    Time 12    Period Weeks    Status Achieved      PT LONG TERM GOAL #2   Title Pt will decrease TUG to below 14 seconds/decrease in order to demonstrate decreased fall risk.    Baseline 09/20/20: 19.2s, 12/14: 17.48, 1/13: 16.65 sec. 01/02/2021=15.75 sec using 4WW, 01/21/21  14.37 seconds. 02/06/2021= 14.92 sec using 4WW. 02/18/2021= 14.5 sec using 4WW. 03/20/2021- Deferred secondary to left knee aching- will attempt next visit. 05/06/2021= 16.32 sec avg using 4WW. 05/13/2021= 16.5 sec with 4WW *increased left knee  pain. 06/11/21: 20.11 seconds (increased time to turn) using 4WW;    Time 12    Period Weeks    Status On-going    Target Date 08/05/21      PT LONG TERM GOAL #3   Title Pt will decrease 5TSTS by at least 3 seconds in order to demonstrate clinically significant improvement in LE strength.    Baseline 09/20/20: 23.1s with minA+1 for strength and balance (Previous discharge 03/06/20: 16.1s), 12/14: 20 sec, 1/13: 22 sec.   01/02/2021= 16.05 sec with light UE support. 01/21/21 01/21/21 37.90 seconds without UE, with UE support 12.93 seconds: 02/06/2021= with 1 UE 17.93 sec. 02/18/2021= 21.33 sec without UE support. 05/06/2021= 15.3 sec with BUE Support and 21. 0 sec without UE support. 05/13/2021=15.0 sec with BUE support; 16 sec without UE support.    Time 12    Period Weeks    Status Achieved      PT LONG TERM GOAL #4   Title Pt will increase his self-selected 10MWT to >0.74 m/s in order to demonstrate improvement in gait speed and improved community ambulation    Baseline 09/20/20: self-selected: 14.4s = 0.69 m/s with rollator (Previous discharge 03/06/20: self-selected: 13.6s = 0.74 m/s), 12/14: 0.75 m/s, 1/13: 0.78 m/s. 01/02/2021= 0.85 m/s using 4WW, 01/16/21 with 4 WW .99 m/s. 02/06/2021= self selected = 0.93 m/s with 8GY    Time 4    Period Weeks    Status Achieved      PT LONG TERM GOAL #5   Title Patient will increase six minute walk test distance to >1000 for progression to community ambulator and improve gait ability    Baseline 01/10/20  620 ft with rollator, 01/21/21  965 ft; 02/06/2021= 910 feet with 4WW.  02/18/2021= 950 feet with 4WW. 05/06/2021= will test next visit. 05/13/2021- Deferred secondary to increased left knee pain. 06/11/21: 773ft with 6ZL *knee pain throughout test.    Time 12  Period Weeks    Status On-going    Target Date 08/05/21      PT LONG TERM GOAL #6   Title Patient will demonstrate ability to walk with HHA +1 > 200 ft. without loss of balance on level surfaces to simulate  walking his grandaughter down the aisle for her upcoming wedding in November 2022.    Baseline 05/13/2021- Patient currently ambulating short community distance with use of 4WW but desires to walk with his grandaughter with HHA for her wedding. 06/11/21: attempted 5 steps with HHA x1, deemed unsafe.    Time 12    Period Weeks    Status On-going    Target Date 08/05/21      PT LONG TERM GOAL #7   Title Pt will decrease 5TSTS  to 14 sec or less with BUE support on armrest in order to demonstrate clinically significant improvement in LE strength.    Baseline 05/13/2021=5.0 sec with BUE support; 16 sec without UE support. 06/11/21: 14.61 seconds with BUE support.    Time 12    Period Weeks    Status On-going    Target Date 08/05/21                   Plan - 07/04/21 1351     Clinical Impression Statement Patient able to demo improved overall step length on TM with VC today and able to stand more erect. He was also able to demon improved standing balance while performing UE activity (even overhead) today. Pt will benefit from further skilled PT to improve BLE strength, balance and functional mobility.    Personal Factors and Comorbidities Age;Comorbidity 3+    Comorbidities HTN, CVA, cholecystitis with cholecystostomy drain    Examination-Activity Limitations Bend;Lift;Squat;Locomotion Level;Stairs;Stand    Examination-Participation Restrictions Community Activity;Driving;Laundry;Meal Prep;Shop    Stability/Clinical Decision Making Unstable/Unpredictable    Rehab Potential Fair    PT Frequency 2x / week    PT Duration 12 weeks    PT Treatment/Interventions ADLs/Self Care Home Management;Cryotherapy;Electrical Stimulation;Iontophoresis 4mg /ml Dexamethasone;Moist Heat;Traction;Ultrasound;DME Instruction;Gait training;Stair training;Therapeutic activities;Therapeutic exercise;Functional mobility training;Balance training;Neuromuscular re-education;Patient/family education;Manual  techniques;Dry needling;Vestibular;Joint Manipulations;Spinal Manipulations;Aquatic Therapy    PT Next Visit Plan Continue to focus on LE strengthening and balance, dynamic balance tasks    PT Home Exercise Plan no changes    Consulted and Agree with Plan of Care Patient             Patient will benefit from skilled therapeutic intervention in order to improve the following deficits and impairments:  Abnormal gait, Decreased balance, Decreased mobility, Difficulty walking, Decreased knowledge of precautions, Decreased safety awareness, Decreased activity tolerance, Decreased strength, Decreased endurance  Visit Diagnosis: Abnormality of gait and mobility  Difficulty in walking, not elsewhere classified  Muscle weakness (generalized)  Other lack of coordination  Unsteadiness on feet     Problem List Patient Active Problem List   Diagnosis Date Noted   Localized osteoarthritis of knees, bilateral 06/13/2021   Moderate protein-calorie malnutrition ([place]) 03/11/2021   Hemiparesis affecting left side as late effect of cerebrovascular accident (CVA) ([place]) 03/11/2021   Aneurysm, aorta, thoracic ([place]) 12/21/2020   Calculus of bile duct without cholecystitis and without obstruction    Non-small cell carcinoma of lung, right ([place]) 09/07/2020   Mass of upper lobe of right lung 07/13/2020   Calculus of bile duct with cholecystitis without obstruction    Abnormal findings on imaging of biliary tract    Acute cholecystitis 05/02/2020   Chronic venous insufficiency 12/05/2019  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    CKD (chronic kidney disease), stage II    New onset atrial fibrillation (H[facility])    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   Goals of care, counseling/discussion    Palliative care by specialist    DNR (do not resuscitate) discussion    History of CVA (cerebrovascular accident) 05/22/2019   Persistent proteinuria 07/30/2018   M[place]itz type 2 second  degree heart block 07/13/2018   Chronic kidney disease, stage III (moderate) ([place]) 06/15/2018   Leg pain 05/06/2018   Lymphedema 05/06/2018   Coagulopathy ([place]) 07/07/2017   Lumbar spondylosis 03/10/2017   Elevated uric acid in blood 11/20/2016   BPH (benign prostatic hyperplasia) 05/29/2016   Peripheral vascular disease of lower extremity ([place]) 03/03/2016   Gallstone 12/31/2015   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   Atherosclerosis of aorta ([place]) 11/22/2015   Carotid artery narrowing 11/22/2015   Diverticulosis of colon 11/22/2015   Decreased creatinine clearance 11/22/2015   Arthritis, degenerative 11/22/2015   Lactose intolerance 11/22/2015   Basal cell carcinoma 11/22/2015   Essential hypertension 06/21/2015   Hyperlipemia 06/21/2015   GERD (gastroesophageal reflux disease) 06/21/2015   Calculus of kidney 11/18/2013    Lewis Moccasin, PT 07/04/2021, 4:22 PM  [place] MAIN [facility] SERVICES [address] [place], [place], [address] Phone: [phone]   Fax:  [phone]  Name: Jose Castro MRN: [id number] Date of Birth: Apr 23, 1926

## 2021-07-09 ENCOUNTER — Ambulatory Visit: Payer: Medicare Other

## 2021-07-09 ENCOUNTER — Other Ambulatory Visit: Payer: Self-pay

## 2021-07-09 DIAGNOSIS — R269 Unspecified abnormalities of gait and mobility: Secondary | ICD-10-CM | POA: Diagnosis not present

## 2021-07-09 DIAGNOSIS — R262 Difficulty in walking, not elsewhere classified: Secondary | ICD-10-CM

## 2021-07-09 DIAGNOSIS — M6281 Muscle weakness (generalized): Secondary | ICD-10-CM

## 2021-07-09 DIAGNOSIS — R2689 Other abnormalities of gait and mobility: Secondary | ICD-10-CM

## 2021-07-09 NOTE — Therapy (Signed)
[place] MAIN [facility] SERVICES [address] [place], [place], [address] Phone: [phone]   Fax:  [phone]  Physical Therapy Treatment  Patient Details  Name: Jose Castro MRN: [id number] Date of Birth: September 25, 1926 Referring Provider (PT): Dr. Ancil Boozer   Encounter Date: 07/09/2021   PT End of Session - 07/09/21 1358     Visit Number 68    Number of Visits 42    Date for PT Re-Evaluation 08/05/21    Authorization Type eval: 09/20/20, Progress note 01/02/2021, PN on 04/07/997, Recert = 3/38/2505-3/97/6734; PN on 4/202022; PN on 12/09/3788; Recert= 2/40/97353-01/09/9241; PN on 06/11/21;    PT Start Time 1344    PT Stop Time 1428    PT Time Calculation (min) 44 min    Equipment Utilized During Treatment Gait belt    Activity Tolerance Patient tolerated treatment well    Behavior During Therapy WFL for tasks assessed/performed             Past Medical History:  Diagnosis Date   Allergy    Penicillin   Arthritis    Basal cell carcinoma    BPH (benign prostatic hyperplasia)    Chronic kidney disease    had a kidney stone which per family was a cyst that was removed   Dysrhythmia    GERD (gastroesophageal reflux disease)    History of kidney stones    Hyperlipidemia    Hypertension    Left-sided low back pain with left-sided sciatica    Lung mass    Splenic vein thrombosis    Stroke ([place]) 05/22/2019   Thrombosis 12/2015   mural  area and no notation of heart attack    Past Surgical History:  Procedure Laterality Date   APPENDECTOMY     CATARACT EXTRACTION, BILATERAL     CYSTOSCOPY WITH INSERTION OF UROLIFT     ENDOSCOPIC RETROGRADE CHOLANGIOPANCREATOGRAPHY (ERCP) WITH PROPOFOL N/A 05/11/2020   Procedure: ENDOSCOPIC RETROGRADE CHOLANGIOPANCREATOGRAPHY (ERCP) WITH PROPOFOL;  Surgeon: [name], MD;  Location: [facility] ENDOSCOPY;  Service: Endoscopy;  Laterality: N/A;   ERCP N/A 10/23/2020   Procedure: ENDOSCOPIC RETROGRADE  CHOLANGIOPANCREATOGRAPHY (ERCP);  Surgeon: [name], MD;  Location: [facility] ENDOSCOPY;  Service: Endoscopy;  Laterality: N/A;   EYE SURGERY  08/2018   IR EXCHANGE BILIARY DRAIN  05/04/2020   KIDNEY STONE SURGERY     KNEE SURGERY     PACEMAKER INSERTION N/A 07/13/2018   Procedure: INSERTION PACEMAKER-DUEL CHAMBER INITIAL IMPLANT;  Surgeon: Isaias Cowman, MD;  Location: [facility] ORS;  Service: Cardiovascular;  Laterality: N/A;   SPINE SURGERY      There were no vitals filed for this visit.   Subjective Assessment - 07/09/21 1349     Subjective Patient reports having a pretty good day so far.    Patient is accompained by: Family member    Pertinent History Pt referred for deconditioning and gait instability. He has had a decline since he last finished therapy. Pt had a hospital admission from 05/02/20 to 05/14/20 for acute cholecystitis and is now s/p cholecystostomy drain placement. Drain is still in place and pt is waiting to hear when they plan to remove it. Pt discharged to SNF and then returned home. In addition pt underwent radiation therapy for a RUL lung mass/cancer and had his last radiation treatment in September of 2021. He reports that he has continued to walk at home as his primary exercise. Pt was previously seen at this clinic for PT/OT due to weakness/imbalance s/p  CVA. Prior history from 09/21/19: Pt is a 87 year old male who presents with imbalance and difficulty with gait following a right frontal CVA with left hemiparesis on 05/22/19.  He has completed inpatient and home health PT, and now presents for OP PT.  He was discharged from inpatient rehab at a supervision level of assistance, ambulating with a RW.  His family notes that he is impulsive and will try to ambulate without his RW at home.  Family is currently providing 24/7 supervision.    Limitations House hold activities;Walking    How long can you sit comfortably? no limitations    How long can you walk comfortably? he  requires a RW    Patient Stated Goals Pt wants to be able to walk his granddaughter down the aisle at her wedding November 2022    Currently in Pain? Yes    Pain Score --   Did not rate pain in left knee except 0/10 at rest but it hurts with walking.   Pain Location Knee    Pain Orientation Left;Anterior    Pain Descriptors / Indicators Aching    Pain Type Chronic pain    Pain Onset More than a month ago    Pain Frequency Constant    Aggravating Factors  Prolonged walking    Pain Relieving Factors Rest    Effect of Pain on Daily Activities Limited standing/walking    Multiple Pain Sites No             Interventions:   Interval training on Nustep  1 min at Level 2 30 sec at Level 5 1 min at Level 2 30 sec at Level 5 1 min at Level 2 30 sec at Level 5  30 sec at Level 2 *Patient reported level 5 as "hard"   Standing on rockerboard- A/P 2 sets of 20 sec hold  Standing on rockerboard - Lateral 2 sets of 20 sec hold *Patient with initial difficulty on 1 set of each but did improve overall with VC and practice.   Resistive LE strengthening- using double GTB (1 at distal thigh and 1 at ankle)  Hip march Hip abd Hip ext 2 sets of 12 reps each with VC and visual demo to perform correctly. Patient required increased VC to slow down while performing each activity.  Heel raises (toes on 1/2 foam and lifting heels off floor)  Toes raises (heels on 1/2 foam and lifting toes off floor) x 15 reps *Patient complained of increased knee pain after completing.  Mini lunge (start with static stand on purple pad with BLE- then lunge with 1 LE onto yellow disc then back with BUE Support) x 15 reps each leg.     Kick ball into goal without UE support- x 10 attempts. Patient exhibited difficulty  dynamically standing long enough to kick the ball but did improve slightly with practice.   Clinical Impression: Patient challenged today with strengthening exercises requiring more cues to slow  down for improved control. He was motivated throughout session but limited by fatigue. He continues to struggle with posture and Left LE weightbearing but no loss of balance with standing activities today. Pt will benefit from further skilled PT to improve BLE strength, balance and functional mobility.                     PT Education - 07/09/21 1358     Education Details Exercise techniques    Person(s) Educated Patient    Methods  Explanation;Demonstration;Tactile cues;Verbal cues    Comprehension Verbalized understanding;Returned demonstration;Verbal cues required;Tactile cues required;Need further instruction              PT Short Term Goals - 01/21/21 1438       PT SHORT TERM GOAL #1   Title Pt will be independent with HEP in order to improve strength and balance in order to decrease fall risk and improve function at home.    Baseline 01/02/2021- Patient verbalize understanding of walking and seated exercises for home program. 01/21/21 patient has been more active and understands HEP.    Time 6    Period Weeks    Status Achieved    Target Date 01/10/21               PT Long Term Goals - 06/11/21 1458       PT LONG TERM GOAL #1   Title Pt will improve BERG by at least 3 points in order to demonstrate clinically significant improvement in balance.    Baseline 09/20/20: 32/56 (Previous discharge: 03/06/20: 46/56), 12/14: 32/56, 1/13: 31/56, 01/16/21 35/56, 02/06/2021=36/56. 02/18/2021= 37/56.03/20/2021- Deferred secondary to left knee aching- will attempt next visit. 05/06/2021= 39/56; 05/13/2021= 36/56 *patient having more left knee pain.    Time 12    Period Weeks    Status Achieved      PT LONG TERM GOAL #2   Title Pt will decrease TUG to below 14 seconds/decrease in order to demonstrate decreased fall risk.    Baseline 09/20/20: 19.2s, 12/14: 17.48, 1/13: 16.65 sec. 01/02/2021=15.75 sec using 4WW, 01/21/21  14.37 seconds. 02/06/2021= 14.92 sec using 4WW. 02/18/2021=  14.5 sec using 4WW. 03/20/2021- Deferred secondary to left knee aching- will attempt next visit. 05/06/2021= 16.32 sec avg using 4WW. 05/13/2021= 16.5 sec with 4WW *increased left knee pain. 06/11/21: 20.11 seconds (increased time to turn) using 4WW;    Time 12    Period Weeks    Status On-going    Target Date 08/05/21      PT LONG TERM GOAL #3   Title Pt will decrease 5TSTS by at least 3 seconds in order to demonstrate clinically significant improvement in LE strength.    Baseline 09/20/20: 23.1s with minA+1 for strength and balance (Previous discharge 03/06/20: 16.1s), 12/14: 20 sec, 1/13: 22 sec.   01/02/2021= 16.05 sec with light UE support. 01/21/21 01/21/21 37.90 seconds without UE, with UE support 12.93 seconds: 02/06/2021= with 1 UE 17.93 sec. 02/18/2021= 21.33 sec without UE support. 05/06/2021= 15.3 sec with BUE Support and 21. 0 sec without UE support. 05/13/2021=15.0 sec with BUE support; 16 sec without UE support.    Time 12    Period Weeks    Status Achieved      PT LONG TERM GOAL #4   Title Pt will increase his self-selected 10MWT to >0.74 m/s in order to demonstrate improvement in gait speed and improved community ambulation    Baseline 09/20/20: self-selected: 14.4s = 0.69 m/s with rollator (Previous discharge 03/06/20: self-selected: 13.6s = 0.74 m/s), 12/14: 0.75 m/s, 1/13: 0.78 m/s. 01/02/2021= 0.85 m/s using 4WW, 01/16/21 with 4 WW .99 m/s. 02/06/2021= self selected = 0.93 m/s with 3TD    Time 4    Period Weeks    Status Achieved      PT LONG TERM GOAL #5   Title Patient will increase six minute walk test distance to >1000 for progression to community ambulator and improve gait ability    Baseline 01/10/20  620 ft with  rollator, 01/21/21  965 ft; 02/06/2021= 910 feet with 4WW.  02/18/2021= 950 feet with 4WW. 05/06/2021= will test next visit. 05/13/2021- Deferred secondary to increased left knee pain. 06/11/21: 778ft with 3YQ *knee pain throughout test.    Time 12    Period Weeks    Status On-going     Target Date 08/05/21      PT LONG TERM GOAL #6   Title Patient will demonstrate ability to walk with HHA +1 > 200 ft. without loss of balance on level surfaces to simulate walking his grandaughter down the aisle for her upcoming wedding in November 2022.    Baseline 05/13/2021- Patient currently ambulating short community distance with use of 4WW but desires to walk with his grandaughter with HHA for her wedding. 06/11/21: attempted 5 steps with HHA x1, deemed unsafe.    Time 12    Period Weeks    Status On-going    Target Date 08/05/21      PT LONG TERM GOAL #7   Title Pt will decrease 5TSTS  to 14 sec or less with BUE support on armrest in order to demonstrate clinically significant improvement in LE strength.    Baseline 05/13/2021=5.0 sec with BUE support; 16 sec without UE support. 06/11/21: 14.61 seconds with BUE support.    Time 12    Period Weeks    Status On-going    Target Date 08/05/21                   Plan - 07/09/21 1359     Clinical Impression Statement Patient challenged today with strengthening exercises requiring more cues to slow down for improved control. He was motivated throughout session but limited by fatigue. He continues to struggle with posture and Left LE weightbearing but no loss of balance with standing activities today. Pt will benefit from further skilled PT to improve BLE strength, balance and functional mobility.    Personal Factors and Comorbidities Age;Comorbidity 3+    Comorbidities HTN, CVA, cholecystitis with cholecystostomy drain    Examination-Activity Limitations Bend;Lift;Squat;Locomotion Level;Stairs;Stand    Examination-Participation Restrictions Community Activity;Driving;Laundry;Meal Prep;Shop    Stability/Clinical Decision Making Unstable/Unpredictable    Rehab Potential Fair    PT Frequency 2x / week    PT Duration 12 weeks    PT Treatment/Interventions ADLs/Self Care Home Management;Cryotherapy;Electrical  Stimulation;Iontophoresis 4mg /ml Dexamethasone;Moist Heat;Traction;Ultrasound;DME Instruction;Gait training;Stair training;Therapeutic activities;Therapeutic exercise;Functional mobility training;Balance training;Neuromuscular re-education;Patient/family education;Manual techniques;Dry needling;Vestibular;Joint Manipulations;Spinal Manipulations;Aquatic Therapy    PT Next Visit Plan Continue to focus on LE strengthening and balance, dynamic balance tasks    PT Home Exercise Plan no changes    Consulted and Agree with Plan of Care Patient             Patient will benefit from skilled therapeutic intervention in order to improve the following deficits and impairments:  Abnormal gait, Decreased balance, Decreased mobility, Difficulty walking, Decreased knowledge of precautions, Decreased safety awareness, Decreased activity tolerance, Decreased strength, Decreased endurance  Visit Diagnosis: Abnormality of gait and mobility  Difficulty in walking, not elsewhere classified  Muscle weakness (generalized)  Other abnormalities of gait and mobility     Problem List Patient Active Problem List   Diagnosis Date Noted   Localized osteoarthritis of knees, bilateral 06/13/2021   Moderate protein-calorie malnutrition ([place]) 03/11/2021   Hemiparesis affecting left side as late effect of cerebrovascular accident (CVA) ([place]) 03/11/2021   Aneurysm, aorta, thoracic ([place]) 12/21/2020   Calculus of bile duct without cholecystitis and without obstruction  Non-small cell carcinoma of lung, right ([place]) 09/07/2020   Mass of upper lobe of right lung 07/13/2020   Calculus of bile duct with cholecystitis without obstruction    Abnormal findings on imaging of biliary tract    Acute cholecystitis 05/02/2020   Chronic venous insufficiency 12/05/2019   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    CKD (chronic kidney disease), stage II    New onset atrial fibrillation ([place])    Right middle cerebral  artery stroke ([place]) 05/27/2019   Goals of care, counseling/discussion    Palliative care by specialist    DNR (do not resuscitate) discussion    History of CVA (cerebrovascular accident) 05/22/2019   Persistent proteinuria 07/30/2018   M[place]itz type 2 second degree heart block 07/13/2018   Chronic kidney disease, stage III (moderate) ([place]) 06/15/2018   Leg pain 05/06/2018   Lymphedema 05/06/2018   Coagulopathy ([place]) 07/07/2017   Lumbar spondylosis 03/10/2017   Elevated uric acid in blood 11/20/2016   BPH (benign prostatic hyperplasia) 05/29/2016   Peripheral vascular disease of lower extremity ([place]) 03/03/2016   Gallstone 12/31/2015   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   Atherosclerosis of aorta ([place]) 11/22/2015   Carotid artery narrowing 11/22/2015   Diverticulosis of colon 11/22/2015   Decreased creatinine clearance 11/22/2015   Arthritis, degenerative 11/22/2015   Lactose intolerance 11/22/2015   Basal cell carcinoma 11/22/2015   Essential hypertension 06/21/2015   Hyperlipemia 06/21/2015   GERD (gastroesophageal reflux disease) 06/21/2015   Calculus of kidney 11/18/2013    Lewis Moccasin, PT 07/09/2021, 3:41 PM  [place] MAIN [facility] SERVICES [address] [place], [place], [address] Phone: [phone]   Fax:  [phone]  Name: Jose Castro MRN: [id number] Date of Birth: [birth year]

## 2021-07-11 ENCOUNTER — Ambulatory Visit: Payer: Medicare Other

## 2021-07-11 ENCOUNTER — Other Ambulatory Visit: Payer: Self-pay | Admitting: Family Medicine

## 2021-07-11 ENCOUNTER — Other Ambulatory Visit: Payer: Self-pay

## 2021-07-11 DIAGNOSIS — R262 Difficulty in walking, not elsewhere classified: Secondary | ICD-10-CM

## 2021-07-11 DIAGNOSIS — N401 Enlarged prostate with lower urinary tract symptoms: Secondary | ICD-10-CM

## 2021-07-11 DIAGNOSIS — R269 Unspecified abnormalities of gait and mobility: Secondary | ICD-10-CM | POA: Diagnosis not present

## 2021-07-11 DIAGNOSIS — M6281 Muscle weakness (generalized): Secondary | ICD-10-CM

## 2021-07-11 DIAGNOSIS — R351 Nocturia: Secondary | ICD-10-CM

## 2021-07-11 NOTE — Therapy (Signed)
[place] MAIN [facility] SERVICES [address] [place], [place], [address] Phone: [phone]   Fax:  [phone]  Physical Therapy Treatment  Patient Details  Name: Jose Castro MRN: [id number] Date of Birth: Apr 02, 1926 Referring Provider (PT): Dr. Ancil Boozer   Encounter Date: 07/11/2021   PT End of Session - 07/11/21 1350     Visit Number 23    Number of Visits 70    Date for PT Re-Evaluation 08/05/21    Authorization Type eval: 09/20/20, Progress note 01/02/2021, PN on 05/08/3418, Recert = 05/22/2978-8/92/1194; PN on 4/202022; PN on 12/08/4079; Recert= 4/48/18563-12/04/9700; PN on 06/11/21;    PT Start Time 1344    PT Stop Time 1428    PT Time Calculation (min) 44 min    Equipment Utilized During Treatment Gait belt    Activity Tolerance Patient tolerated treatment well    Behavior During Therapy WFL for tasks assessed/performed             Past Medical History:  Diagnosis Date   Allergy    Penicillin   Arthritis    Basal cell carcinoma    BPH (benign prostatic hyperplasia)    Chronic kidney disease    had a kidney stone which per family was a cyst that was removed   Dysrhythmia    GERD (gastroesophageal reflux disease)    History of kidney stones    Hyperlipidemia    Hypertension    Left-sided low back pain with left-sided sciatica    Lung mass    Splenic vein thrombosis    Stroke ([place]) 05/22/2019   Thrombosis 12/2015   mural  area and no notation of heart attack    Past Surgical History:  Procedure Laterality Date   APPENDECTOMY     CATARACT EXTRACTION, BILATERAL     CYSTOSCOPY WITH INSERTION OF UROLIFT     ENDOSCOPIC RETROGRADE CHOLANGIOPANCREATOGRAPHY (ERCP) WITH PROPOFOL N/A 05/11/2020   Procedure: ENDOSCOPIC RETROGRADE CHOLANGIOPANCREATOGRAPHY (ERCP) WITH PROPOFOL;  Surgeon: [name], MD;  Location: [facility] ENDOSCOPY;  Service: Endoscopy;  Laterality: N/A;   ERCP N/A 10/23/2020   Procedure: ENDOSCOPIC RETROGRADE  CHOLANGIOPANCREATOGRAPHY (ERCP);  Surgeon: [name], MD;  Location: [facility] ENDOSCOPY;  Service: Endoscopy;  Laterality: N/A;   EYE SURGERY  08/2018   IR EXCHANGE BILIARY DRAIN  05/04/2020   KIDNEY STONE SURGERY     KNEE SURGERY     PACEMAKER INSERTION N/A 07/13/2018   Procedure: INSERTION PACEMAKER-DUEL CHAMBER INITIAL IMPLANT;  Surgeon: Isaias Cowman, MD;  Location: [facility] ORS;  Service: Cardiovascular;  Laterality: N/A;   SPINE SURGERY      There were no vitals filed for this visit.   Subjective Assessment - 07/11/21 1349     Subjective Patient reports feeling the same as last visit- pretty good so far.    Patient is accompained by: Family member    Pertinent History Pt referred for deconditioning and gait instability. He has had a decline since he last finished therapy. Pt had a hospital admission from 05/02/20 to 05/14/20 for acute cholecystitis and is now s/p cholecystostomy drain placement. Drain is still in place and pt is waiting to hear when they plan to remove it. Pt discharged to SNF and then returned home. In addition pt underwent radiation therapy for a RUL lung mass/cancer and had his last radiation treatment in September of 2021. He reports that he has continued to walk at home as his primary exercise. Pt was previously seen at this clinic for PT/OT due  to weakness/imbalance s/p CVA. Prior history from 09/21/19: Pt is a 87 year old male who presents with imbalance and difficulty with gait following a right frontal CVA with left hemiparesis on 05/22/19.  He has completed inpatient and home health PT, and now presents for OP PT.  He was discharged from inpatient rehab at a supervision level of assistance, ambulating with a RW.  His family notes that he is impulsive and will try to ambulate without his RW at home.  Family is currently providing 24/7 supervision.    Limitations House hold activities;Walking    How long can you sit comfortably? no limitations    How long can you walk  comfortably? he requires a RW    Patient Stated Goals Pt wants to be able to walk his granddaughter down the aisle at her wedding November 2022    Currently in Pain? No/denies    Pain Onset More than a month ago               Interventions:   Therapeutic exercises:   Nustep L3 LE only - Cues to keep SPM > 60 and patient able to total 0.19 mi x 5 min.   Seated Ham curl (using Matrix cable system)   1) 7.5 lb x12 reps each LE 2) 12.5 lb x 10 reps each LE 3) 12.5 lb x 10 reps each LE  *patient reports as medium  Scap retraction- Seated using Matrix cable system  1) 12.5 lb. X 12 reps 2) 12.5 lb  X 12 reps 3) 17.5 lb X 10 reps *patient reports as Medium hard  Forward trunk lean while holding onto yellow weighted ball  x 10 reps (increased VC and visual demo to perform correctly). Upon completion - patient progressed to performing forward trunk lean and then into Sit to stand - while ball was extended forward (with arms outstretched) x 10 reps. *Much improved ability to stand with forward lean today.   UE shoulder flex holding weighted ball with arms outstretched x 15 reps.  Education provided throughout session via VC/TC and demonstration to facilitate movement at target joints and correct muscle activation for all testing and exercises performed.   Reassessed 6 min walk test= 900 feet with 4WW (improved from 770 feet on 06/11/2021)   Clinical Impression: Patient performed well with resistive training without significant issues other than fatigue. He was able to progress with more resistance today and able to complete without compromising form. He also performed well with leaning forward after VC and visual demo and able to improve his technique with sit to stand today. Pt will benefit from further skilled PT to improve BLE strength, balance and functional mobility.                PT Education - 07/11/21 1350     Education Details exercise technique    Person(s)  Educated Patient    Methods Explanation;Demonstration;Tactile cues;Verbal cues    Comprehension Verbalized understanding;Returned demonstration;Verbal cues required;Tactile cues required;Need further instruction              PT Short Term Goals - 01/21/21 1438       PT SHORT TERM GOAL #1   Title Pt will be independent with HEP in order to improve strength and balance in order to decrease fall risk and improve function at home.    Baseline 01/02/2021- Patient verbalize understanding of walking and seated exercises for home program. 01/21/21 patient has been more active and understands HEP.  Time 6    Period Weeks    Status Achieved    Target Date 01/10/21               PT Long Term Goals - 07/11/21 1540       PT LONG TERM GOAL #1   Title Pt will improve BERG by at least 3 points in order to demonstrate clinically significant improvement in balance.    Baseline 09/20/20: 32/56 (Previous discharge: 03/06/20: 46/56), 12/14: 32/56, 1/13: 31/56, 01/16/21 35/56, 02/06/2021=36/56. 02/18/2021= 37/56.03/20/2021- Deferred secondary to left knee aching- will attempt next visit. 05/06/2021= 39/56; 05/13/2021= 36/56 *patient having more left knee pain.    Time 12    Period Weeks    Status Achieved      PT LONG TERM GOAL #2   Title Pt will decrease TUG to below 14 seconds/decrease in order to demonstrate decreased fall risk.    Baseline 09/20/20: 19.2s, 12/14: 17.48, 1/13: 16.65 sec. 01/02/2021=15.75 sec using 4WW, 01/21/21  14.37 seconds. 02/06/2021= 14.92 sec using 4WW. 02/18/2021= 14.5 sec using 4WW. 03/20/2021- Deferred secondary to left knee aching- will attempt next visit. 05/06/2021= 16.32 sec avg using 4WW. 05/13/2021= 16.5 sec with 4WW *increased left knee pain. 06/11/21: 20.11 seconds (increased time to turn) using 4WW;    Time 12    Period Weeks    Status On-going      PT LONG TERM GOAL #3   Title Pt will decrease 5TSTS by at least 3 seconds in order to demonstrate clinically significant  improvement in LE strength.    Baseline 09/20/20: 23.1s with minA+1 for strength and balance (Previous discharge 03/06/20: 16.1s), 12/14: 20 sec, 1/13: 22 sec.   01/02/2021= 16.05 sec with light UE support. 01/21/21 01/21/21 37.90 seconds without UE, with UE support 12.93 seconds: 02/06/2021= with 1 UE 17.93 sec. 02/18/2021= 21.33 sec without UE support. 05/06/2021= 15.3 sec with BUE Support and 21. 0 sec without UE support. 05/13/2021=15.0 sec with BUE support; 16 sec without UE support.    Time 12    Period Weeks    Status Achieved      PT LONG TERM GOAL #4   Title Pt will increase his self-selected 10MWT to >0.74 m/s in order to demonstrate improvement in gait speed and improved community ambulation    Baseline 09/20/20: self-selected: 14.4s = 0.69 m/s with rollator (Previous discharge 03/06/20: self-selected: 13.6s = 0.74 m/s), 12/14: 0.75 m/s, 1/13: 0.78 m/s. 01/02/2021= 0.85 m/s using 4WW, 01/16/21 with 4 WW .99 m/s. 02/06/2021= self selected = 0.93 m/s with 0WC    Time 4    Period Weeks    Status Achieved      PT LONG TERM GOAL #5   Title Patient will increase six minute walk test distance to >1000 for progression to community ambulator and improve gait ability    Baseline 01/10/20  620 ft with rollator, 01/21/21  965 ft; 02/06/2021= 910 feet with 4WW.  02/18/2021= 950 feet with 4WW. 05/06/2021= will test next visit. 05/13/2021- Deferred secondary to increased left knee pain. 06/11/21: 750ft with 3JS *knee pain throughout test. 07/11/2021= 900 feet with use of 4WW.    Time 12    Period Weeks    Status On-going    Target Date 08/05/21      PT LONG TERM GOAL #6   Title Patient will demonstrate ability to walk with HHA +1 > 200 ft. without loss of balance on level surfaces to simulate walking his grandaughter down the aisle for her upcoming  wedding in November 2022.    Baseline 05/13/2021- Patient currently ambulating short community distance with use of 4WW but desires to walk with his grandaughter with HHA for her  wedding. 06/11/21: attempted 5 steps with HHA x1, deemed unsafe.    Time 12    Period Weeks    Status On-going    Target Date 08/05/21      PT LONG TERM GOAL #7   Title Pt will decrease 5TSTS  to 14 sec or less with BUE support on armrest in order to demonstrate clinically significant improvement in LE strength.    Baseline 05/13/2021=5.0 sec with BUE support; 16 sec without UE support. 06/11/21: 14.61 seconds with BUE support.    Time 12    Period Weeks    Status On-going    Target Date 08/05/21                   Plan - 07/11/21 1548     Clinical Impression Statement Patient performed well with resistive training without significant issues other than fatigue. He was able to progress with more resistance today and able to complete without compromising form. He also performed well with leaning forward after VC and visual demo and able to improve his technique with sit to stand today. Pt will benefit from further skilled PT to improve BLE strength, balance and functional mobility.    Personal Factors and Comorbidities Age;Comorbidity 3+    Comorbidities HTN, CVA, cholecystitis with cholecystostomy drain    Examination-Activity Limitations Bend;Lift;Squat;Locomotion Level;Stairs;Stand    Examination-Participation Restrictions Community Activity;Driving;Laundry;Meal Prep;Shop    Stability/Clinical Decision Making Unstable/Unpredictable    Rehab Potential Fair    PT Frequency 2x / week    PT Duration 12 weeks    PT Treatment/Interventions ADLs/Self Care Home Management;Cryotherapy;Electrical Stimulation;Iontophoresis 4mg /ml Dexamethasone;Moist Heat;Traction;Ultrasound;DME Instruction;Gait training;Stair training;Therapeutic activities;Therapeutic exercise;Functional mobility training;Balance training;Neuromuscular re-education;Patient/family education;Manual techniques;Dry needling;Vestibular;Joint Manipulations;Spinal Manipulations;Aquatic Therapy    PT Next Visit Plan Continue to focus  on LE strengthening and balance, dynamic balance tasks    PT Home Exercise Plan no changes    Consulted and Agree with Plan of Care Patient             Patient will benefit from skilled therapeutic intervention in order to improve the following deficits and impairments:  Abnormal gait, Decreased balance, Decreased mobility, Difficulty walking, Decreased knowledge of precautions, Decreased safety awareness, Decreased activity tolerance, Decreased strength, Decreased endurance  Visit Diagnosis: Abnormality of gait and mobility  Difficulty in walking, not elsewhere classified  Muscle weakness (generalized)     Problem List Patient Active Problem List   Diagnosis Date Noted   Localized osteoarthritis of knees, bilateral 06/13/2021   Moderate protein-calorie malnutrition ([place]) 03/11/2021   Hemiparesis affecting left side as late effect of cerebrovascular accident (CVA) ([place]) 03/11/2021   Aneurysm, aorta, thoracic ([place]) 12/21/2020   Calculus of bile duct without cholecystitis and without obstruction    Non-small cell carcinoma of lung, right ([place]) 09/07/2020   Mass of upper lobe of right lung 07/13/2020   Calculus of bile duct with cholecystitis without obstruction    Abnormal findings on imaging of biliary tract    Acute cholecystitis 05/02/2020   Chronic venous insufficiency 12/05/2019   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    CKD (chronic kidney disease), stage II    New onset atrial fibrillation ([place])    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   Goals of care, counseling/discussion    Palliative care by  specialist    DNR (do not resuscitate) discussion    History of CVA (cerebrovascular accident) 05/22/2019   Persistent proteinuria 07/30/2018   Mobitz type 2 second degree heart block 07/13/2018   Chronic kidney disease, stage III (moderate) ([place]) 06/15/2018   Leg pain 05/06/2018   Lymphedema 05/06/2018   Coagulopathy ([place]) 07/07/2017   Lumbar  spondylosis 03/10/2017   Elevated uric acid in blood 11/20/2016   BPH (benign prostatic hyperplasia) 05/29/2016   Peripheral vascular disease of lower extremity ([place]) 03/03/2016   Gallstone 12/31/2015   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   Atherosclerosis of aorta ([place]) 11/22/2015   Carotid artery narrowing 11/22/2015   Diverticulosis of colon 11/22/2015   Decreased creatinine clearance 11/22/2015   Arthritis, degenerative 11/22/2015   Lactose intolerance 11/22/2015   Basal cell carcinoma 11/22/2015   Essential hypertension 06/21/2015   Hyperlipemia 06/21/2015   GERD (gastroesophageal reflux disease) 06/21/2015   Calculus of kidney 11/18/2013    Lewis Moccasin, PT 07/11/2021, 3:49 PM  [place] MAIN [facility] SERVICES [address] [place], [place], [address] Phone: [phone]   Fax:  [phone]  Name: Jose Castro MRN: [id number] Date of Birth: 1926-07-11

## 2021-07-16 ENCOUNTER — Other Ambulatory Visit: Payer: Self-pay

## 2021-07-16 ENCOUNTER — Ambulatory Visit: Payer: Medicare Other

## 2021-07-16 DIAGNOSIS — R278 Other lack of coordination: Secondary | ICD-10-CM

## 2021-07-16 DIAGNOSIS — R269 Unspecified abnormalities of gait and mobility: Secondary | ICD-10-CM

## 2021-07-16 DIAGNOSIS — M6281 Muscle weakness (generalized): Secondary | ICD-10-CM

## 2021-07-16 DIAGNOSIS — R262 Difficulty in walking, not elsewhere classified: Secondary | ICD-10-CM

## 2021-07-16 NOTE — Therapy (Signed)
[place] MAIN [facility] SERVICES [address] [place], [place], [address] Phone: [phone]   Fax:  [phone]  Physical Therapy Treatment/Physical Therapy Progress Note   Dates of reporting period  06/11/2021   to  07/16/2021  Patient Details  Name: Jose Castro MRN: [id number] Date of Birth: 03-28-1926 Referring Provider (PT): Dr. Ancil Boozer   Encounter Date: 07/16/2021   PT End of Session - 07/16/21 1402     Visit Number 88    Number of Visits 67    Date for PT Re-Evaluation 08/05/21    Authorization Type eval: 09/20/20, Progress note 01/02/2021, PN on 03/06/5034, Recert = 4/65/6812-7/51/7001; PN on 4/202022; PN on 06/03/9448; Recert= 6/75/91638-03/06/6598; PN on 06/11/21; PN on 07/16/2021    PT Start Time 1354    PT Stop Time 1429    PT Time Calculation (min) 35 min    Equipment Utilized During Treatment Gait belt    Activity Tolerance Patient tolerated treatment well    Behavior During Therapy WFL for tasks assessed/performed             Past Medical History:  Diagnosis Date   Allergy    Penicillin   Arthritis    Basal cell carcinoma    BPH (benign prostatic hyperplasia)    Chronic kidney disease    had a kidney stone which per family was a cyst that was removed   Dysrhythmia    GERD (gastroesophageal reflux disease)    History of kidney stones    Hyperlipidemia    Hypertension    Left-sided low back pain with left-sided sciatica    Lung mass    Splenic vein thrombosis    Stroke ([place]) 05/22/2019   Thrombosis 12/2015   mural  area and no notation of heart attack    Past Surgical History:  Procedure Laterality Date   APPENDECTOMY     CATARACT EXTRACTION, BILATERAL     CYSTOSCOPY WITH INSERTION OF UROLIFT     ENDOSCOPIC RETROGRADE CHOLANGIOPANCREATOGRAPHY (ERCP) WITH PROPOFOL N/A 05/11/2020   Procedure: ENDOSCOPIC RETROGRADE CHOLANGIOPANCREATOGRAPHY (ERCP) WITH PROPOFOL;  Surgeon: [name], MD;  Location: [facility] ENDOSCOPY;   Service: Endoscopy;  Laterality: N/A;   ERCP N/A 10/23/2020   Procedure: ENDOSCOPIC RETROGRADE CHOLANGIOPANCREATOGRAPHY (ERCP);  Surgeon: [name], MD;  Location: [facility] ENDOSCOPY;  Service: Endoscopy;  Laterality: N/A;   EYE SURGERY  08/2018   IR EXCHANGE BILIARY DRAIN  05/04/2020   KIDNEY STONE SURGERY     KNEE SURGERY     PACEMAKER INSERTION N/A 07/13/2018   Procedure: INSERTION PACEMAKER-DUEL CHAMBER INITIAL IMPLANT;  Surgeon: Isaias Cowman, MD;  Location: [facility] ORS;  Service: Cardiovascular;  Laterality: N/A;   SPINE SURGERY      There were no vitals filed for this visit.   Subjective Assessment - 07/16/21 1356     Subjective Patient having a good day. He reports he was able to get fitted for a tuxedo yesterday.    Patient is accompained by: Family member    Pertinent History Pt referred for deconditioning and gait instability. He has had a decline since he last finished therapy. Pt had a hospital admission from 05/02/20 to 05/14/20 for acute cholecystitis and is now s/p cholecystostomy drain placement. Drain is still in place and pt is waiting to hear when they plan to remove it. Pt discharged to SNF and then returned home. In addition pt underwent radiation therapy for a RUL lung mass/cancer and had his last radiation treatment in September of 2021.  He reports that he has continued to walk at home as his primary exercise. Pt was previously seen at this clinic for PT/OT due to weakness/imbalance s/p CVA. Prior history from 09/21/19: Pt is a 87 year old male who presents with imbalance and difficulty with gait following a right frontal CVA with left hemiparesis on 05/22/19.  He has completed inpatient and home health PT, and now presents for OP PT.  He was discharged from inpatient rehab at a supervision level of assistance, ambulating with a RW.  His family notes that he is impulsive and will try to ambulate without his RW at home.  Family is currently providing 24/7 supervision.     Limitations House hold activities;Walking    How long can you sit comfortably? no limitations    How long can you walk comfortably? he requires a RW    Patient Stated Goals Pt wants to be able to walk his granddaughter down the aisle at her wedding November 2022    Currently in Pain? No/denies    Pain Onset More than a month ago                [facility] PT Assessment - 07/16/21 1428       Berg Balance Test   Sit to Stand Able to stand  independently using hands    Standing Unsupported Able to stand safely 2 minutes    Sitting with Back Unsupported but Feet Supported on Floor or Stool Able to sit safely and securely 2 minutes    Stand to Sit Controls descent by using hands    Transfers Able to transfer safely, definite need of hands    Standing Unsupported with Eyes Closed Able to stand 10 seconds safely    Standing Unsupported with Feet Together Able to place feet together independently and stand 1 minute safely    From Standing, Reach Forward with Outstretched Arm Can reach confidently >25 cm (10")    From Standing Position, Pick up Object from Floor Able to pick up shoe, needs supervision    From Standing Position, Turn to Look Behind Over each Shoulder Turn sideways only but maintains balance    Turn 360 Degrees Needs close supervision or verbal cueing    Standing Unsupported, Alternately Place Feet on Step/Stool Able to complete 4 steps without aid or supervision    Standing Unsupported, One Foot in Front Able to take small step independently and hold 30 seconds    Standing on One Leg Tries to lift leg/unable to hold 3 seconds but remains standing independently    Total Score 40              Reassessed LTGs:  BERG= 40/56 improved from 36/56 on 6/13 5 x STS= 23 sec without UE support- Patient expressed more fatigued today. Patient ambulated around 100 feet with HHA (arms interlocked) with PT to simulate walking daughter down aisle. He was minimally unsteady yet no pain and  no loss of balance.   Clinical Impression: Patient continues to progress as seen by improved BERG balance test score and was able to ambulate short distance with HHA to simulate walking grandaughter down aisle. He was not limited by pain but continues to be at increased risk of falling. Patient's condition has the potential to improve in response to therapy. Maximum improvement is yet to be obtained. The anticipated improvement is attainable and reasonable in a generally predictable time.  PT Education - 07/16/21 1357     Education Details exercise technique    Person(s) Educated Patient    Methods Explanation;Demonstration;Tactile cues;Verbal cues;Handout    Comprehension Verbalized understanding;Returned demonstration;Verbal cues required;Tactile cues required;Need further instruction              PT Short Term Goals - 01/21/21 1438       PT SHORT TERM GOAL #1   Title Pt will be independent with HEP in order to improve strength and balance in order to decrease fall risk and improve function at home.    Baseline 01/02/2021- Patient verbalize understanding of walking and seated exercises for home program. 01/21/21 patient has been more active and understands HEP.    Time 6    Period Weeks    Status Achieved    Target Date 01/10/21               PT Long Term Goals - 07/16/21 1405       PT LONG TERM GOAL #1   Title Pt will improve BERG by at least 3 points in order to demonstrate clinically significant improvement in balance.    Baseline 09/20/20: 32/56 (Previous discharge: 03/06/20: 46/56), 12/14: 32/56, 1/13: 31/56, 01/16/21 35/56, 02/06/2021=36/56. 02/18/2021= 37/56.03/20/2021- Deferred secondary to left knee aching- will attempt next visit. 05/06/2021= 39/56; 05/13/2021= 36/56 *patient having more left knee pain. 07/16/2021= 40/56    Time 12    Period Weeks    Status Achieved      PT LONG TERM GOAL #2   Title Pt will decrease TUG to below 14  seconds/decrease in order to demonstrate decreased fall risk.    Baseline 09/20/20: 19.2s, 12/14: 17.48, 1/13: 16.65 sec. 01/02/2021=15.75 sec using 4WW, 01/21/21  14.37 seconds. 02/06/2021= 14.92 sec using 4WW. 02/18/2021= 14.5 sec using 4WW. 03/20/2021- Deferred secondary to left knee aching- will attempt next visit. 05/06/2021= 16.32 sec avg using 4WW. 05/13/2021= 16.5 sec with 4WW *increased left knee pain. 06/11/21: 20.11 seconds (increased time to turn) using 4WW;    Time 12    Period Weeks    Status On-going      PT LONG TERM GOAL #3   Title Pt will decrease 5TSTS by at least 3 seconds in order to demonstrate clinically significant improvement in LE strength.    Baseline 09/20/20: 23.1s with minA+1 for strength and balance (Previous discharge 03/06/20: 16.1s), 12/14: 20 sec, 1/13: 22 sec.   01/02/2021= 16.05 sec with light UE support. 01/21/21 01/21/21 37.90 seconds without UE, with UE support 12.93 seconds: 02/06/2021= with 1 UE 17.93 sec. 02/18/2021= 21.33 sec without UE support. 05/06/2021= 15.3 sec with BUE Support and 21. 0 sec without UE support. 05/13/2021=15.0 sec with BUE support; 16 sec without UE support.    Time 12    Period Weeks    Status Achieved      PT LONG TERM GOAL #4   Title Pt will increase his self-selected 10MWT to >0.74 m/s in order to demonstrate improvement in gait speed and improved community ambulation    Baseline 09/20/20: self-selected: 14.4s = 0.69 m/s with rollator (Previous discharge 03/06/20: self-selected: 13.6s = 0.74 m/s), 12/14: 0.75 m/s, 1/13: 0.78 m/s. 01/02/2021= 0.85 m/s using 4WW, 01/16/21 with 4 WW .99 m/s. 02/06/2021= self selected = 0.93 m/s with 1WE    Time 4    Period Weeks    Status Achieved      PT LONG TERM GOAL #5   Title Patient will increase six minute walk test distance to >  1000 for progression to community ambulator and improve gait ability    Baseline 01/10/20  620 ft with rollator, 01/21/21  965 ft; 02/06/2021= 910 feet with 4WW.  02/18/2021= 950 feet with  4WW. 05/06/2021= will test next visit. 05/13/2021- Deferred secondary to increased left knee pain. 06/11/21: 728ft with 7BL *knee pain throughout test. 07/11/2021= 900 feet with use of 4WW.    Time 12    Period Weeks    Status On-going      PT LONG TERM GOAL #6   Title Patient will demonstrate ability to walk with HHA +1 > 200 ft. without loss of balance on level surfaces to simulate walking his grandaughter down the aisle for her upcoming wedding in November 2022.    Baseline 05/13/2021- Patient currently ambulating short community distance with use of 4WW but desires to walk with his grandaughter with HHA for her wedding. 06/11/21: attempted 5 steps with HHA x1, deemed unsafe. 07/16/2021- Patient ambulated around 100 feet with HHA (arms interlocked) with PT to simulate walking daughter down aisle.    Time 12    Period Weeks    Status On-going      PT LONG TERM GOAL #7   Title Pt will decrease 5TSTS  to 14 sec or less with BUE support on armrest in order to demonstrate clinically significant improvement in LE strength.    Baseline 05/13/2021=5.0 sec with BUE support; 16 sec without UE support. 06/11/21: 14.61 seconds with BUE support. 07/16/2021= 23 sec without UE support.    Time 12    Period Weeks    Status On-going    Target Date 08/05/21                   Plan - 07/16/21 1402     Clinical Impression Statement Patient continues to progress as seen by improved BERG balance test score and was able to ambulate short distance with HHA to simulate walking grandaughter down aisle. He was not limited by pain but continues to be at increased risk of falling. Patient's condition has the potential to improve in response to therapy. Maximum improvement is yet to be obtained. The anticipated improvement is attainable and reasonable in a generally predictable time.    Personal Factors and Comorbidities Age;Comorbidity 3+    Comorbidities HTN, CVA, cholecystitis with cholecystostomy drain     Examination-Activity Limitations Bend;Lift;Squat;Locomotion Level;Stairs;Stand    Examination-Participation Restrictions Community Activity;Driving;Laundry;Meal Prep;Shop    Stability/Clinical Decision Making Unstable/Unpredictable    Rehab Potential Fair    PT Frequency 2x / week    PT Duration 12 weeks    PT Treatment/Interventions ADLs/Self Care Home Management;Cryotherapy;Electrical Stimulation;Iontophoresis 4mg /ml Dexamethasone;Moist Heat;Traction;Ultrasound;DME Instruction;Gait training;Stair training;Therapeutic activities;Therapeutic exercise;Functional mobility training;Balance training;Neuromuscular re-education;Patient/family education;Manual techniques;Dry needling;Vestibular;Joint Manipulations;Spinal Manipulations;Aquatic Therapy    PT Next Visit Plan Continue to focus on LE strengthening and balance, dynamic balance tasks    PT Home Exercise Plan no changes    Consulted and Agree with Plan of Care Patient             Patient will benefit from skilled therapeutic intervention in order to improve the following deficits and impairments:  Abnormal gait, Decreased balance, Decreased mobility, Difficulty walking, Decreased knowledge of precautions, Decreased safety awareness, Decreased activity tolerance, Decreased strength, Decreased endurance  Visit Diagnosis: Abnormality of gait and mobility  Difficulty in walking, not elsewhere classified  Muscle weakness (generalized)  Other lack of coordination     Problem List Patient Active Problem List   Diagnosis Date Noted  Localized osteoarthritis of knees, bilateral 06/13/2021   Moderate protein-calorie malnutrition ([place]) 03/11/2021   Hemiparesis affecting left side as late effect of cerebrovascular accident (CVA) ([place]) 03/11/2021   Aneurysm, aorta, thoracic ([place]) 12/21/2020   Calculus of bile duct without cholecystitis and without obstruction    Non-small cell carcinoma of lung, right ([place]) 09/07/2020   Mass of upper  lobe of right lung 07/13/2020   Calculus of bile duct with cholecystitis without obstruction    Abnormal findings on imaging of biliary tract    Acute cholecystitis 05/02/2020   Chronic venous insufficiency 12/05/2019   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    CKD (chronic kidney disease), stage II    New onset atrial fibrillation ([place])    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   Goals of care, counseling/discussion    Palliative care by specialist    DNR (do not resuscitate) discussion    History of CVA (cerebrovascular accident) 05/22/2019   Persistent proteinuria 07/30/2018   M[place]itz type 2 second degree heart block 07/13/2018   Chronic kidney disease, stage III (moderate) ([place]) 06/15/2018   Leg pain 05/06/2018   Lymphedema 05/06/2018   Coagulopathy ([place]) 07/07/2017   Lumbar spondylosis 03/10/2017   Elevated uric acid in blood 11/20/2016   BPH (benign prostatic hyperplasia) 05/29/2016   Peripheral vascular disease of lower extremity ([place]) 03/03/2016   Gallstone 12/31/2015   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   Atherosclerosis of aorta ([place]) 11/22/2015   Carotid artery narrowing 11/22/2015   Diverticulosis of colon 11/22/2015   Decreased creatinine clearance 11/22/2015   Arthritis, degenerative 11/22/2015   Lactose intolerance 11/22/2015   Basal cell carcinoma 11/22/2015   Essential hypertension 06/21/2015   Hyperlipemia 06/21/2015   GERD (gastroesophageal reflux disease) 06/21/2015   Calculus of kidney 11/18/2013    Lewis Moccasin, PT 07/16/2021, 5:37 PM  [place] MAIN [facility] SERVICES [address] [place], [place], [address] Phone: [phone]   Fax:  [phone]  Name: Jose Castro MRN: [id number] Date of Birth: 1926/06/17

## 2021-07-18 ENCOUNTER — Ambulatory Visit: Payer: Medicare Other

## 2021-07-18 ENCOUNTER — Other Ambulatory Visit: Payer: Self-pay

## 2021-07-18 DIAGNOSIS — M6281 Muscle weakness (generalized): Secondary | ICD-10-CM

## 2021-07-18 DIAGNOSIS — R262 Difficulty in walking, not elsewhere classified: Secondary | ICD-10-CM

## 2021-07-18 DIAGNOSIS — R269 Unspecified abnormalities of gait and mobility: Secondary | ICD-10-CM | POA: Diagnosis not present

## 2021-07-18 NOTE — Therapy (Signed)
[place] MAIN [facility] SERVICES [address] [place], [place], [address] Phone: [phone]   Fax:  [phone]  Physical Therapy Treatment  Patient Details  Name: Jose Castro MRN: [id number] Date of Birth: Jan 06, 1926 Referring Provider (PT): Dr. Ancil Boozer   Encounter Date: 07/18/2021   PT End of Session - 07/18/21 1447     Visit Number 40    Number of Visits 61    Date for PT Re-Evaluation 08/05/21    Authorization Type eval: 09/20/20, Progress note 01/02/2021, PN on 01/30/7613, Recert = 06/08/2956-4/73/4037; PN on 4/202022; PN on 0/08/6437; Recert= 3/81/84037-04/03/3605; PN on 06/11/21; PN on 07/16/2021    PT Start Time 1345    PT Stop Time 1429    PT Time Calculation (min) 44 min    Equipment Utilized During Treatment Gait belt    Activity Tolerance Patient tolerated treatment well    Behavior During Therapy WFL for tasks assessed/performed             Past Medical History:  Diagnosis Date   Allergy    Penicillin   Arthritis    Basal cell carcinoma    BPH (benign prostatic hyperplasia)    Chronic kidney disease    had a kidney stone which per family was a cyst that was removed   Dysrhythmia    GERD (gastroesophageal reflux disease)    History of kidney stones    Hyperlipidemia    Hypertension    Left-sided low back pain with left-sided sciatica    Lung mass    Splenic vein thrombosis    Stroke ([place]) 05/22/2019   Thrombosis 12/2015   mural  area and no notation of heart attack    Past Surgical History:  Procedure Laterality Date   APPENDECTOMY     CATARACT EXTRACTION, BILATERAL     CYSTOSCOPY WITH INSERTION OF UROLIFT     ENDOSCOPIC RETROGRADE CHOLANGIOPANCREATOGRAPHY (ERCP) WITH PROPOFOL N/A 05/11/2020   Procedure: ENDOSCOPIC RETROGRADE CHOLANGIOPANCREATOGRAPHY (ERCP) WITH PROPOFOL;  Surgeon: [name], MD;  Location: [facility] ENDOSCOPY;  Service: Endoscopy;  Laterality: N/A;   ERCP N/A 10/23/2020   Procedure: ENDOSCOPIC  RETROGRADE CHOLANGIOPANCREATOGRAPHY (ERCP);  Surgeon: [name], MD;  Location: [facility] ENDOSCOPY;  Service: Endoscopy;  Laterality: N/A;   EYE SURGERY  08/2018   IR EXCHANGE BILIARY DRAIN  05/04/2020   KIDNEY STONE SURGERY     KNEE SURGERY     PACEMAKER INSERTION N/A 07/13/2018   Procedure: INSERTION PACEMAKER-DUEL CHAMBER INITIAL IMPLANT;  Surgeon: Isaias Cowman, MD;  Location: [facility] ORS;  Service: Cardiovascular;  Laterality: N/A;   SPINE SURGERY      There were no vitals filed for this visit.   Subjective Assessment - 07/18/21 1444     Subjective Patient reports he is doing well, no falls or LOB, accompanied by family member/caregiver.    Patient is accompained by: Family member    Pertinent History Pt referred for deconditioning and gait instability. He has had a decline since he last finished therapy. Pt had a hospital admission from 05/02/20 to 05/14/20 for acute cholecystitis and is now s/p cholecystostomy drain placement. Drain is still in place and pt is waiting to hear when they plan to remove it. Pt discharged to SNF and then returned home. In addition pt underwent radiation therapy for a RUL lung mass/cancer and had his last radiation treatment in September of 2021. He reports that he has continued to walk at home as his primary exercise. Pt was previously seen at  this clinic for PT/OT due to weakness/imbalance s/p CVA. Prior history from 09/21/19: Pt is a 87 year old male who presents with imbalance and difficulty with gait following a right frontal CVA with left hemiparesis on 05/22/19.  He has completed inpatient and home health PT, and now presents for OP PT.  He was discharged from inpatient rehab at a supervision level of assistance, ambulating with a RW.  His family notes that he is impulsive and will try to ambulate without his RW at home.  Family is currently providing 24/7 supervision.    Limitations House hold activities;Walking    How long can you sit comfortably? no  limitations    How long can you walk comfortably? he requires a RW    Patient Stated Goals Pt wants to be able to walk his granddaughter down the aisle at her wedding November 2022    Currently in Pain? No/denies                Standing:   Patient ambulated around 160 feet with HHA (arms interlocked) with PT to simulate walking daughter down aisle. He was minimally unsteady with increasing drag of LLE due to weakness and discomfort.   Standing with SUE support (LUE);  -hedgehog taps 4x each LE ( 3 hedgehogs each LE); very challenging to weight shift onto LLE -marching in place cue for height of march 12x each LE  Sit to stand grab a basketball and throw at hoop for pertubation and stabilization in standing no UE support x 15 balls/sit to stands     Seated: RTB row 15x with cue for scapular retraction RTB straight arm flexion with abduction 15x for posture Posterior trunk lean over rolled towel 10x GTB hamstring curl 15x each LE GTB around bilateral ankles: alternating LAQ 12x each LE IR/ER stepping over hedgehog 12x each LE; patient reports as hard  Pt educated throughout session about proper posture and technique with exercises. Improved exercise technique, movement at target joints, use of target muscles after min to mod verbal, visual, tactile cues.   Patient requires occasional cueing for task orientation due to tangential nature of patient. He is highly motivated to ambulate with SUE support for his granddaughters wedding. Occasional pain/weakness in LLE limits session in standing as leg frequently gives out with prolonged weightbearing Pt will benefit from further skilled PT to improve BLE strength, balance and functional mobility.                PT Education - 07/18/21 1446     Education Details exercise technique, body mechanics    Person(s) Educated Patient    Methods Explanation;Demonstration;Tactile cues;Verbal cues    Comprehension Verbalized  understanding;Returned demonstration;Verbal cues required;Tactile cues required              PT Short Term Goals - 01/21/21 1438       PT SHORT TERM GOAL #1   Title Pt will be independent with HEP in order to improve strength and balance in order to decrease fall risk and improve function at home.    Baseline 01/02/2021- Patient verbalize understanding of walking and seated exercises for home program. 01/21/21 patient has been more active and understands HEP.    Time 6    Period Weeks    Status Achieved    Target Date 01/10/21               PT Long Term Goals - 07/16/21 1405       PT LONG TERM GOAL #  1   Title Pt will improve BERG by at least 3 points in order to demonstrate clinically significant improvement in balance.    Baseline 09/20/20: 32/56 (Previous discharge: 03/06/20: 46/56), 12/14: 32/56, 1/13: 31/56, 01/16/21 35/56, 02/06/2021=36/56. 02/18/2021= 37/56.03/20/2021- Deferred secondary to left knee aching- will attempt next visit. 05/06/2021= 39/56; 05/13/2021= 36/56 *patient having more left knee pain. 07/16/2021= 40/56    Time 12    Period Weeks    Status Achieved      PT LONG TERM GOAL #2   Title Pt will decrease TUG to below 14 seconds/decrease in order to demonstrate decreased fall risk.    Baseline 09/20/20: 19.2s, 12/14: 17.48, 1/13: 16.65 sec. 01/02/2021=15.75 sec using 4WW, 01/21/21  14.37 seconds. 02/06/2021= 14.92 sec using 4WW. 02/18/2021= 14.5 sec using 4WW. 03/20/2021- Deferred secondary to left knee aching- will attempt next visit. 05/06/2021= 16.32 sec avg using 4WW. 05/13/2021= 16.5 sec with 4WW *increased left knee pain. 06/11/21: 20.11 seconds (increased time to turn) using 4WW;    Time 12    Period Weeks    Status On-going      PT LONG TERM GOAL #3   Title Pt will decrease 5TSTS by at least 3 seconds in order to demonstrate clinically significant improvement in LE strength.    Baseline 09/20/20: 23.1s with minA+1 for strength and balance (Previous discharge 03/06/20:  16.1s), 12/14: 20 sec, 1/13: 22 sec.   01/02/2021= 16.05 sec with light UE support. 01/21/21 01/21/21 37.90 seconds without UE, with UE support 12.93 seconds: 02/06/2021= with 1 UE 17.93 sec. 02/18/2021= 21.33 sec without UE support. 05/06/2021= 15.3 sec with BUE Support and 21. 0 sec without UE support. 05/13/2021=15.0 sec with BUE support; 16 sec without UE support.    Time 12    Period Weeks    Status Achieved      PT LONG TERM GOAL #4   Title Pt will increase his self-selected 10MWT to >0.74 m/s in order to demonstrate improvement in gait speed and improved community ambulation    Baseline 09/20/20: self-selected: 14.4s = 0.69 m/s with rollator (Previous discharge 03/06/20: self-selected: 13.6s = 0.74 m/s), 12/14: 0.75 m/s, 1/13: 0.78 m/s. 01/02/2021= 0.85 m/s using 4WW, 01/16/21 with 4 WW .99 m/s. 02/06/2021= self selected = 0.93 m/s with 8FO    Time 4    Period Weeks    Status Achieved      PT LONG TERM GOAL #5   Title Patient will increase six minute walk test distance to >1000 for progression to community ambulator and improve gait ability    Baseline 01/10/20  620 ft with rollator, 01/21/21  965 ft; 02/06/2021= 910 feet with 4WW.  02/18/2021= 950 feet with 4WW. 05/06/2021= will test next visit. 05/13/2021- Deferred secondary to increased left knee pain. 06/11/21: 76ft with 2DX *knee pain throughout test. 07/11/2021= 900 feet with use of 4WW.    Time 12    Period Weeks    Status On-going      PT LONG TERM GOAL #6   Title Patient will demonstrate ability to walk with HHA +1 > 200 ft. without loss of balance on level surfaces to simulate walking his grandaughter down the aisle for her upcoming wedding in November 2022.    Baseline 05/13/2021- Patient currently ambulating short community distance with use of 4WW but desires to walk with his grandaughter with HHA for her wedding. 06/11/21: attempted 5 steps with HHA x1, deemed unsafe. 07/16/2021- Patient ambulated around 100 feet with HHA (arms interlocked) with PT  to simulate walking daughter down aisle.    Time 12    Period Weeks    Status On-going      PT LONG TERM GOAL #7   Title Pt will decrease 5TSTS  to 14 sec or less with BUE support on armrest in order to demonstrate clinically significant improvement in LE strength.    Baseline 05/13/2021=5.0 sec with BUE support; 16 sec without UE support. 06/11/21: 14.61 seconds with BUE support. 07/16/2021= 23 sec without UE support.    Time 12    Period Weeks    Status On-going    Target Date 08/05/21                   Plan - 07/18/21 1448     Clinical Impression Statement Patient requires occasional cueing for task orientation due to tangential nature of patient. He is highly motivated to ambulate with SUE support for his granddaughters wedding. Occasional pain/weakness in LLE limits session in standing as leg frequently gives out with prolonged weightbearing Pt will benefit from further skilled PT to improve BLE strength, balance and functional mobility.    Personal Factors and Comorbidities Age;Comorbidity 3+    Comorbidities HTN, CVA, cholecystitis with cholecystostomy drain    Examination-Activity Limitations Bend;Lift;Squat;Locomotion Level;Stairs;Stand    Examination-Participation Restrictions Community Activity;Driving;Laundry;Meal Prep;Shop    Stability/Clinical Decision Making Unstable/Unpredictable    Rehab Potential Fair    PT Frequency 2x / week    PT Duration 12 weeks    PT Treatment/Interventions ADLs/Self Care Home Management;Cryotherapy;Electrical Stimulation;Iontophoresis 4mg /ml Dexamethasone;Moist Heat;Traction;Ultrasound;DME Instruction;Gait training;Stair training;Therapeutic activities;Therapeutic exercise;Functional mobility training;Balance training;Neuromuscular re-education;Patient/family education;Manual techniques;Dry needling;Vestibular;Joint Manipulations;Spinal Manipulations;Aquatic Therapy    PT Next Visit Plan Continue to focus on LE strengthening and balance,  dynamic balance tasks    PT Home Exercise Plan no changes    Consulted and Agree with Plan of Care Patient             Patient will benefit from skilled therapeutic intervention in order to improve the following deficits and impairments:  Abnormal gait, Decreased balance, Decreased mobility, Difficulty walking, Decreased knowledge of precautions, Decreased safety awareness, Decreased activity tolerance, Decreased strength, Decreased endurance  Visit Diagnosis: Abnormality of gait and mobility  Difficulty in walking, not elsewhere classified  Muscle weakness (generalized)     Problem List Patient Active Problem List   Diagnosis Date Noted   Localized osteoarthritis of knees, bilateral 06/13/2021   Moderate protein-calorie malnutrition ([place]) 03/11/2021   Hemiparesis affecting left side as late effect of cerebrovascular accident (CVA) ([place]) 03/11/2021   Aneurysm, aorta, thoracic ([place]) 12/21/2020   Calculus of bile duct without cholecystitis and without obstruction    Non-small cell carcinoma of lung, right ([place]) 09/07/2020   Mass of upper lobe of right lung 07/13/2020   Calculus of bile duct with cholecystitis without obstruction    Abnormal findings on imaging of biliary tract    Acute cholecystitis 05/02/2020   Chronic venous insufficiency 12/05/2019   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    CKD (chronic kidney disease), stage II    New onset atrial fibrillation ([facility])    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   Goals of care, counseling/discussion    Palliative care by specialist    DNR (do not resuscitate) discussion    History of CVA (cerebrovascular accident) 05/22/2019   Persistent proteinuria 07/30/2018   Mobitz type 2 second degree heart block 07/13/2018   Chronic kidney disease, stage III (moderate) ([place]) 06/15/2018  Leg pain 05/06/2018   Lymphedema 05/06/2018   Coagulopathy ([place]) 07/07/2017   Lumbar spondylosis 03/10/2017   Elevated  uric acid in blood 11/20/2016   BPH (benign prostatic hyperplasia) 05/29/2016   Peripheral vascular disease of lower extremity ([place]) 03/03/2016   Gallstone 12/31/2015   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   Atherosclerosis of aorta ([place]) 11/22/2015   Carotid artery narrowing 11/22/2015   Diverticulosis of colon 11/22/2015   Decreased creatinine clearance 11/22/2015   Arthritis, degenerative 11/22/2015   Lactose intolerance 11/22/2015   Basal cell carcinoma 11/22/2015   Essential hypertension 06/21/2015   Hyperlipemia 06/21/2015   GERD (gastroesophageal reflux disease) 06/21/2015   Calculus of kidney 11/18/2013    Janna Arch, PT, DPT  07/18/2021, 2:50 PM  [place] MAIN [facility] SERVICES [address] [place], [place], [address] Phone: [phone]   Fax:  [phone]  Name: Jose Castro MRN: [id number] Date of Birth: 12/15/1925

## 2021-07-23 ENCOUNTER — Other Ambulatory Visit: Payer: Self-pay

## 2021-07-23 ENCOUNTER — Ambulatory Visit: Payer: Medicare Other

## 2021-07-23 DIAGNOSIS — R269 Unspecified abnormalities of gait and mobility: Secondary | ICD-10-CM | POA: Diagnosis not present

## 2021-07-23 DIAGNOSIS — M6281 Muscle weakness (generalized): Secondary | ICD-10-CM

## 2021-07-23 DIAGNOSIS — R262 Difficulty in walking, not elsewhere classified: Secondary | ICD-10-CM

## 2021-07-23 NOTE — Therapy (Signed)
[place] MAIN [facility] SERVICES [address] [place], [place], [address] Phone: [phone]   Fax:  [phone]  Physical Therapy Treatment  Patient Details  Name: Jose Castro MRN: [id number] Date of Birth: 1926-10-04 Referring Provider (PT): Dr. Ancil Boozer   Encounter Date: 07/23/2021   PT End of Session - 07/23/21 1437     Visit Number 25    Number of Visits 50    Date for PT Re-Evaluation 08/05/21    Authorization Type eval: 09/20/20, Progress note 01/02/2021, PN on 02/06/2504, Recert = 3/97/6734-1/93/7902; PN on 4/202022; PN on 4/0/9735; Recert= 3/29/92426-07/04/4195; PN on 06/11/21; PN on 07/16/2021    PT Start Time 1345    PT Stop Time 1429    PT Time Calculation (min) 44 min    Equipment Utilized During Treatment Gait belt    Activity Tolerance Patient tolerated treatment well    Behavior During Therapy WFL for tasks assessed/performed             Past Medical History:  Diagnosis Date   Allergy    Penicillin   Arthritis    Basal cell carcinoma    BPH (benign prostatic hyperplasia)    Chronic kidney disease    had a kidney stone which per family was a cyst that was removed   Dysrhythmia    GERD (gastroesophageal reflux disease)    History of kidney stones    Hyperlipidemia    Hypertension    Left-sided low back pain with left-sided sciatica    Lung mass    Splenic vein thrombosis    Stroke ([place]) 05/22/2019   Thrombosis 12/2015   mural  area and no notation of heart attack    Past Surgical History:  Procedure Laterality Date   APPENDECTOMY     CATARACT EXTRACTION, BILATERAL     CYSTOSCOPY WITH INSERTION OF UROLIFT     ENDOSCOPIC RETROGRADE CHOLANGIOPANCREATOGRAPHY (ERCP) WITH PROPOFOL N/A 05/11/2020   Procedure: ENDOSCOPIC RETROGRADE CHOLANGIOPANCREATOGRAPHY (ERCP) WITH PROPOFOL;  Surgeon: [name], MD;  Location: [facility] ENDOSCOPY;  Service: Endoscopy;  Laterality: N/A;   ERCP N/A 10/23/2020   Procedure: ENDOSCOPIC  RETROGRADE CHOLANGIOPANCREATOGRAPHY (ERCP);  Surgeon: [name], MD;  Location: [facility] ENDOSCOPY;  Service: Endoscopy;  Laterality: N/A;   EYE SURGERY  08/2018   IR EXCHANGE BILIARY DRAIN  05/04/2020   KIDNEY STONE SURGERY     KNEE SURGERY     PACEMAKER INSERTION N/A 07/13/2018   Procedure: INSERTION PACEMAKER-DUEL CHAMBER INITIAL IMPLANT;  Surgeon: Isaias Cowman, MD;  Location: [facility] ORS;  Service: Cardiovascular;  Laterality: N/A;   SPINE SURGERY      There were no vitals filed for this visit.   Subjective Assessment - 07/23/21 1436     Subjective Patient reports back pain began yesterday thinks he has slept wrong.  Will have a cardiologist appointment after session.  No falls or loss of balance reported since last session    Patient is accompained by: Family member    Pertinent History Pt referred for deconditioning and gait instability. He has had a decline since he last finished therapy. Pt had a hospital admission from 05/02/20 to 05/14/20 for acute cholecystitis and is now s/p cholecystostomy drain placement. Drain is still in place and pt is waiting to hear when they plan to remove it. Pt discharged to SNF and then returned home. In addition pt underwent radiation therapy for a RUL lung mass/cancer and had his last radiation treatment in September of 2021. He reports that  he has continued to walk at home as his primary exercise. Pt was previously seen at this clinic for PT/OT due to weakness/imbalance s/p CVA. Prior history from 09/21/19: Pt is a 87 year old male who presents with imbalance and difficulty with gait following a right frontal CVA with left hemiparesis on 05/22/19.  He has completed inpatient and home health PT, and now presents for OP PT.  He was discharged from inpatient rehab at a supervision level of assistance, ambulating with a RW.  His family notes that he is impulsive and will try to ambulate without his RW at home.  Family is currently providing 24/7 supervision.     Limitations House hold activities;Walking    How long can you sit comfortably? no limitations    How long can you walk comfortably? he requires a RW    Patient Stated Goals Pt wants to be able to walk his granddaughter down the aisle at her wedding November 2022    Currently in Pain? No/denies                 Standing:    Patient ambulated around 160 feet with HHA (arms interlocked) with PT to simulate walking daughter down aisle. He was minimally unsteady with increasing drag of LLE due to weakness and discomfort.    Standing with SUE support (LUE);  -hedgehog taps 4x each LE ( 3 hedgehogs each LE); very challenging to weight shift onto LLE -marching in place cue for height of march 12x each LE   Sit to stand grab a basketball and throw at hoop for perturbation and stabilization in standing no UE support x 15 balls/sit to stands    Standing with # 3lb ankle weight: CGA for stability   -Hip abduction with BUE upper extremity support, cueing for neutral foot alignment for correct muscle activation, 10x each LE -Hip flexion with BUE upper extremity support, cueing for body mechanics, speed of muscle recruitment for optimal strengthening and stabilization 10x each LE      Seated: GTB row 15x with cue for scapular retraction GTB straight arm lat pull down 10x against PT resistance Large swiss ball forward flexion 10x 3 second holds  IR/ER stepping over hedgehog 12x each LE; patient reports as hard    Seated with # 3lb ankle weights  -Seated marches with upright posture, back away from back of chair for abdominal/trunk activation/stabilization, 10x each LE -Seated LAQ with 3 second holds, 10x each LE, cueing for muscle activation and sequencing for neutral alignment -Seated IR/ER with cueing for stabilizing knee placement with lateral foot movement for optimal muscle recruitment, 10x each LE  Pt educated throughout session about proper posture and technique with exercises.  Improved exercise technique, movement at target joints, use of target muscles after min to mod verbal, visual, tactile cues.     Patient is highly motivated throughout session despite new onset of back pain yesterday. He utilized heating pad on back during seated intervention to help reduce pain.  Standing treatments limited by pain and fatigue.  Patient's ambulation has increased episodes of trunk sway and loss of balance requiring heavy assistance on PT arm.  Per family duration of ambulation for wedding will be 125 feet which patient has completed successfully in session.Pt will benefit from further skilled PT to improve BLE strength, balance and functional mobility                   PT Education - 07/23/21 1436     Education  Details exercise technique, body mechanics    Person(s) Educated Patient    Methods Explanation;Demonstration;Tactile cues;Verbal cues    Comprehension Verbalized understanding;Returned demonstration;Verbal cues required;Tactile cues required              PT Short Term Goals - 01/21/21 1438       PT SHORT TERM GOAL #1   Title Pt will be independent with HEP in order to improve strength and balance in order to decrease fall risk and improve function at home.    Baseline 01/02/2021- Patient verbalize understanding of walking and seated exercises for home program. 01/21/21 patient has been more active and understands HEP.    Time 6    Period Weeks    Status Achieved    Target Date 01/10/21               PT Long Term Goals - 07/16/21 1405       PT LONG TERM GOAL #1   Title Pt will improve BERG by at least 3 points in order to demonstrate clinically significant improvement in balance.    Baseline 09/20/20: 32/56 (Previous discharge: 03/06/20: 46/56), 12/14: 32/56, 1/13: 31/56, 01/16/21 35/56, 02/06/2021=36/56. 02/18/2021= 37/56.03/20/2021- Deferred secondary to left knee aching- will attempt next visit. 05/06/2021= 39/56; 05/13/2021= 36/56 *patient  having more left knee pain. 07/16/2021= 40/56    Time 12    Period Weeks    Status Achieved      PT LONG TERM GOAL #2   Title Pt will decrease TUG to below 14 seconds/decrease in order to demonstrate decreased fall risk.    Baseline 09/20/20: 19.2s, 12/14: 17.48, 1/13: 16.65 sec. 01/02/2021=15.75 sec using 4WW, 01/21/21  14.37 seconds. 02/06/2021= 14.92 sec using 4WW. 02/18/2021= 14.5 sec using 4WW. 03/20/2021- Deferred secondary to left knee aching- will attempt next visit. 05/06/2021= 16.32 sec avg using 4WW. 05/13/2021= 16.5 sec with 4WW *increased left knee pain. 06/11/21: 20.11 seconds (increased time to turn) using 4WW;    Time 12    Period Weeks    Status On-going      PT LONG TERM GOAL #3   Title Pt will decrease 5TSTS by at least 3 seconds in order to demonstrate clinically significant improvement in LE strength.    Baseline 09/20/20: 23.1s with minA+1 for strength and balance (Previous discharge 03/06/20: 16.1s), 12/14: 20 sec, 1/13: 22 sec.   01/02/2021= 16.05 sec with light UE support. 01/21/21 01/21/21 37.90 seconds without UE, with UE support 12.93 seconds: 02/06/2021= with 1 UE 17.93 sec. 02/18/2021= 21.33 sec without UE support. 05/06/2021= 15.3 sec with BUE Support and 21. 0 sec without UE support. 05/13/2021=15.0 sec with BUE support; 16 sec without UE support.    Time 12    Period Weeks    Status Achieved      PT LONG TERM GOAL #4   Title Pt will increase his self-selected 10MWT to >0.74 m/s in order to demonstrate improvement in gait speed and improved community ambulation    Baseline 09/20/20: self-selected: 14.4s = 0.69 m/s with rollator (Previous discharge 03/06/20: self-selected: 13.6s = 0.74 m/s), 12/14: 0.75 m/s, 1/13: 0.78 m/s. 01/02/2021= 0.85 m/s using 4WW, 01/16/21 with 4 WW .99 m/s. 02/06/2021= self selected = 0.93 m/s with 1HE    Time 4    Period Weeks    Status Achieved      PT LONG TERM GOAL #5   Title Patient will increase six minute walk test distance to >1000 for progression to  community ambulator and improve gait  ability    Baseline 01/10/20  620 ft with rollator, 01/21/21  965 ft; 02/06/2021= 910 feet with 4WW.  02/18/2021= 950 feet with 4WW. 05/06/2021= will test next visit. 05/13/2021- Deferred secondary to increased left knee pain. 06/11/21: 738ft with 1VO *knee pain throughout test. 07/11/2021= 900 feet with use of 4WW.    Time 12    Period Weeks    Status On-going      PT LONG TERM GOAL #6   Title Patient will demonstrate ability to walk with HHA +1 > 200 ft. without loss of balance on level surfaces to simulate walking his grandaughter down the aisle for her upcoming wedding in November 2022.    Baseline 05/13/2021- Patient currently ambulating short community distance with use of 4WW but desires to walk with his grandaughter with HHA for her wedding. 06/11/21: attempted 5 steps with HHA x1, deemed unsafe. 07/16/2021- Patient ambulated around 100 feet with HHA (arms interlocked) with PT to simulate walking daughter down aisle.    Time 12    Period Weeks    Status On-going      PT LONG TERM GOAL #7   Title Pt will decrease 5TSTS  to 14 sec or less with BUE support on armrest in order to demonstrate clinically significant improvement in LE strength.    Baseline 05/13/2021=5.0 sec with BUE support; 16 sec without UE support. 06/11/21: 14.61 seconds with BUE support. 07/16/2021= 23 sec without UE support.    Time 12    Period Weeks    Status On-going    Target Date 08/05/21                   Plan - 07/23/21 1437     Clinical Impression Statement Patient is highly motivated throughout session despite new onset of back pain yesterday. He utilized heating pad on back during seated intervention to help reduce pain.  Standing treatments limited by pain and fatigue.  Patient's ambulation has increased episodes of trunk sway and loss of balance requiring heavy assistance on PT arm.  Per family duration of ambulation for wedding will be 125 feet which patient has completed  successfully in session.Pt will benefit from further skilled PT to improve BLE strength, balance and functional mobility    Personal Factors and Comorbidities Age;Comorbidity 3+    Comorbidities HTN, CVA, cholecystitis with cholecystostomy drain    Examination-Activity Limitations Bend;Lift;Squat;Locomotion Level;Stairs;Stand    Examination-Participation Restrictions Community Activity;Driving;Laundry;Meal Prep;Shop    Stability/Clinical Decision Making Unstable/Unpredictable    Rehab Potential Fair    PT Frequency 2x / week    PT Duration 12 weeks    PT Treatment/Interventions ADLs/Self Care Home Management;Cryotherapy;Electrical Stimulation;Iontophoresis 4mg /ml Dexamethasone;Moist Heat;Traction;Ultrasound;DME Instruction;Gait training;Stair training;Therapeutic activities;Therapeutic exercise;Functional mobility training;Balance training;Neuromuscular re-education;Patient/family education;Manual techniques;Dry needling;Vestibular;Joint Manipulations;Spinal Manipulations;Aquatic Therapy    PT Next Visit Plan Continue to focus on LE strengthening and balance, dynamic balance tasks    PT Home Exercise Plan no changes    Consulted and Agree with Plan of Care Patient             Patient will benefit from skilled therapeutic intervention in order to improve the following deficits and impairments:  Abnormal gait, Decreased balance, Decreased mobility, Difficulty walking, Decreased knowledge of precautions, Decreased safety awareness, Decreased activity tolerance, Decreased strength, Decreased endurance  Visit Diagnosis: Abnormality of gait and mobility  Difficulty in walking, not elsewhere classified  Muscle weakness (generalized)     Problem List Patient Active Problem List   Diagnosis Date Noted  Localized osteoarthritis of knees, bilateral 06/13/2021   Moderate protein-calorie malnutrition ([place]) 03/11/2021   Hemiparesis affecting left side as late effect of cerebrovascular  accident (CVA) ([place]) 03/11/2021   Aneurysm, aorta, thoracic ([place]) 12/21/2020   Calculus of bile duct without cholecystitis and without obstruction    Non-small cell carcinoma of lung, right ([place]) 09/07/2020   Mass of upper lobe of right lung 07/13/2020   Calculus of bile duct with cholecystitis without obstruction    Abnormal findings on imaging of biliary tract    Acute cholecystitis 05/02/2020   Chronic venous insufficiency 12/05/2019   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    CKD (chronic kidney disease), stage II    New onset atrial fibrillation ([place])    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   Goals of care, counseling/discussion    Palliative care by specialist    DNR (do not resuscitate) discussion    History of CVA (cerebrovascular accident) 05/22/2019   Persistent proteinuria 07/30/2018   Mobitz type 2 second degree heart block 07/13/2018   Chronic kidney disease, stage III (moderate) ([place]) 06/15/2018   Leg pain 05/06/2018   Lymphedema 05/06/2018   Coagulopathy ([place]) 07/07/2017   Lumbar spondylosis 03/10/2017   Elevated uric acid in blood 11/20/2016   BPH (benign prostatic hyperplasia) 05/29/2016   Peripheral vascular disease of lower extremity ([place]) 03/03/2016   Gallstone 12/31/2015   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   Atherosclerosis of aorta ([place]) 11/22/2015   Carotid artery narrowing 11/22/2015   Diverticulosis of colon 11/22/2015   Decreased creatinine clearance 11/22/2015   Arthritis, degenerative 11/22/2015   Lactose intolerance 11/22/2015   Basal cell carcinoma 11/22/2015   Essential hypertension 06/21/2015   Hyperlipemia 06/21/2015   GERD (gastroesophageal reflux disease) 06/21/2015   Calculus of kidney 11/18/2013   Janna Arch, PT, DPT  07/23/2021, 2:41 PM  [place] MAIN [facility] SERVICES [address] [place], [place], [address] Phone: [phone]   Fax:   [phone]  Name: Jose Castro MRN: [id number] Date of Birth: 1926/01/08

## 2021-07-25 ENCOUNTER — Other Ambulatory Visit: Payer: Self-pay

## 2021-07-25 ENCOUNTER — Ambulatory Visit: Payer: Medicare Other

## 2021-07-25 DIAGNOSIS — R269 Unspecified abnormalities of gait and mobility: Secondary | ICD-10-CM

## 2021-07-25 DIAGNOSIS — R262 Difficulty in walking, not elsewhere classified: Secondary | ICD-10-CM

## 2021-07-25 DIAGNOSIS — M6281 Muscle weakness (generalized): Secondary | ICD-10-CM

## 2021-07-25 DIAGNOSIS — R278 Other lack of coordination: Secondary | ICD-10-CM

## 2021-07-25 NOTE — Therapy (Signed)
[place] MAIN [facility] SERVICES [address] [place], [place], [address] Phone: [phone]   Fax:  [phone]  Physical Therapy Treatment  Patient Details  Name: Jose Castro MRN: [id number] Date of Birth: 01-12-1926 Referring Provider (PT): Dr. Ancil Boozer   Encounter Date: 07/25/2021   PT End of Session - 07/25/21 1445     Visit Number 44    Number of Visits 74    Date for PT Re-Evaluation 08/05/21    Authorization Type eval: 09/20/20, Progress note 01/02/2021, PN on 01/29/5399, Recert = 8/67/6195-0/93/2671; PN on 4/202022; PN on 01/05/5808; Recert= 9/83/38250-04/02/9766; PN on 06/11/21; PN on 07/16/2021    PT Start Time 1345    PT Stop Time 1428    PT Time Calculation (min) 43 min    Equipment Utilized During Treatment Gait belt    Activity Tolerance Patient tolerated treatment well    Behavior During Therapy WFL for tasks assessed/performed             Past Medical History:  Diagnosis Date   Allergy    Penicillin   Arthritis    Basal cell carcinoma    BPH (benign prostatic hyperplasia)    Chronic kidney disease    had a kidney stone which per family was a cyst that was removed   Dysrhythmia    GERD (gastroesophageal reflux disease)    History of kidney stones    Hyperlipidemia    Hypertension    Left-sided low back pain with left-sided sciatica    Lung mass    Splenic vein thrombosis    Stroke ([place]) 05/22/2019   Thrombosis 12/2015   mural  area and no notation of heart attack    Past Surgical History:  Procedure Laterality Date   APPENDECTOMY     CATARACT EXTRACTION, BILATERAL     CYSTOSCOPY WITH INSERTION OF UROLIFT     ENDOSCOPIC RETROGRADE CHOLANGIOPANCREATOGRAPHY (ERCP) WITH PROPOFOL N/A 05/11/2020   Procedure: ENDOSCOPIC RETROGRADE CHOLANGIOPANCREATOGRAPHY (ERCP) WITH PROPOFOL;  Surgeon: [name], MD;  Location: [facility] ENDOSCOPY;  Service: Endoscopy;  Laterality: N/A;   ERCP N/A 10/23/2020   Procedure: ENDOSCOPIC  RETROGRADE CHOLANGIOPANCREATOGRAPHY (ERCP);  Surgeon: [name], MD;  Location: [facility] ENDOSCOPY;  Service: Endoscopy;  Laterality: N/A;   EYE SURGERY  08/2018   IR EXCHANGE BILIARY DRAIN  05/04/2020   KIDNEY STONE SURGERY     KNEE SURGERY     PACEMAKER INSERTION N/A 07/13/2018   Procedure: INSERTION PACEMAKER-DUEL CHAMBER INITIAL IMPLANT;  Surgeon: Isaias Cowman, MD;  Location: [facility] ORS;  Service: Cardiovascular;  Laterality: N/A;   SPINE SURGERY      There were no vitals filed for this visit.   Subjective Assessment - 07/25/21 1346     Subjective Patient reports ongoing low back pain- rates about a 5/10 today.    Patient is accompained by: Family member    Pertinent History Pt referred for deconditioning and gait instability. He has had a decline since he last finished therapy. Pt had a hospital admission from 05/02/20 to 05/14/20 for acute cholecystitis and is now s/p cholecystostomy drain placement. Drain is still in place and pt is waiting to hear when they plan to remove it. Pt discharged to SNF and then returned home. In addition pt underwent radiation therapy for a RUL lung mass/cancer and had his last radiation treatment in September of 2021. He reports that he has continued to walk at home as his primary exercise. Pt was previously seen at this clinic for  PT/OT due to weakness/imbalance s/p CVA. Prior history from 09/21/19: Pt is a 87 year old male who presents with imbalance and difficulty with gait following a right frontal CVA with left hemiparesis on 05/22/19.  He has completed inpatient and home health PT, and now presents for OP PT.  He was discharged from inpatient rehab at a supervision level of assistance, ambulating with a RW.  His family notes that he is impulsive and will try to ambulate without his RW at home.  Family is currently providing 24/7 supervision.    Limitations House hold activities;Walking    How long can you sit comfortably? no limitations    How long can you  walk comfortably? he requires a RW    Patient Stated Goals Pt wants to be able to walk his granddaughter down the aisle at her wedding November 2022    Currently in Pain? Yes    Pain Score 5     Pain Location Back    Pain Orientation Lower    Pain Descriptors / Indicators Aching    Pain Type Chronic pain    Pain Frequency Constant    Aggravating Factors  Sit to stand transfers/bed mobility    Pain Relieving Factors Rest    Effect of Pain on Daily Activities Limited standing/walking    Multiple Pain Sites No              Interventions:   Therapeutic Exercises:    Nustep L3 with LE only x 5 min - Cues for full ROM and to keep SPM > 60.   Due to patient reporting 5/10 low back pain - performed PROM to low back including Double knee to chest, Lower trunk rotation- left to right for total of 10 min. Patient able to improve and demo improved flexibility with stretching.   Forward trunk lean (hand towards shoes to stretch lumbar region) x 20 sec hold x 4. Patient able to increase forward lean with practice.   Sit to stand with forward lean from edge of mat without UE support x 15 reps.   Seated hip flex with 5lb BLE x 12 reps each Seated knee ext with 5lb 5LE x 12 reps each Seated hamstring curl with blue theraband x 12 reps BLE  Seated hip flex/abd over cone x 15 reps each LE Standing calf raises with 5lb x 12 reps each LE.   Education provided throughout session via VC/TC and demonstration to facilitate movement at target joints and correct muscle activation for all testing and exercises performed.   Clinical Impression: Treatment limited to mostly seated therex due to patient ongoing back pain. He was able to respond well with all verbal cues and able to participate well with all seated therex. He fatigues quickly so only performed 1 set of each exercise today. He was able to demo increased lumbar flexibility with PROM today. Pt will benefit from further skilled PT to improve  BLE strength, balance and functional mobility                       PT Education - 07/25/21 1346     Education Details Exercise technique, body mechanics    Person(s) Educated Patient    Methods Explanation;Demonstration;Tactile cues;Verbal cues    Comprehension Verbalized understanding;Returned demonstration;Verbal cues required;Tactile cues required;Need further instruction              PT Short Term Goals - 01/21/21 1438       PT SHORT TERM GOAL #  1   Title Pt will be independent with HEP in order to improve strength and balance in order to decrease fall risk and improve function at home.    Baseline 01/02/2021- Patient verbalize understanding of walking and seated exercises for home program. 01/21/21 patient has been more active and understands HEP.    Time 6    Period Weeks    Status Achieved    Target Date 01/10/21               PT Long Term Goals - 07/16/21 1405       PT LONG TERM GOAL #1   Title Pt will improve BERG by at least 3 points in order to demonstrate clinically significant improvement in balance.    Baseline 09/20/20: 32/56 (Previous discharge: 03/06/20: 46/56), 12/14: 32/56, 1/13: 31/56, 01/16/21 35/56, 02/06/2021=36/56. 02/18/2021= 37/56.03/20/2021- Deferred secondary to left knee aching- will attempt next visit. 05/06/2021= 39/56; 05/13/2021= 36/56 *patient having more left knee pain. 07/16/2021= 40/56    Time 12    Period Weeks    Status Achieved      PT LONG TERM GOAL #2   Title Pt will decrease TUG to below 14 seconds/decrease in order to demonstrate decreased fall risk.    Baseline 09/20/20: 19.2s, 12/14: 17.48, 1/13: 16.65 sec. 01/02/2021=15.75 sec using 4WW, 01/21/21  14.37 seconds. 02/06/2021= 14.92 sec using 4WW. 02/18/2021= 14.5 sec using 4WW. 03/20/2021- Deferred secondary to left knee aching- will attempt next visit. 05/06/2021= 16.32 sec avg using 4WW. 05/13/2021= 16.5 sec with 4WW *increased left knee pain. 06/11/21: 20.11 seconds (increased  time to turn) using 4WW;    Time 12    Period Weeks    Status On-going      PT LONG TERM GOAL #3   Title Pt will decrease 5TSTS by at least 3 seconds in order to demonstrate clinically significant improvement in LE strength.    Baseline 09/20/20: 23.1s with minA+1 for strength and balance (Previous discharge 03/06/20: 16.1s), 12/14: 20 sec, 1/13: 22 sec.   01/02/2021= 16.05 sec with light UE support. 01/21/21 01/21/21 37.90 seconds without UE, with UE support 12.93 seconds: 02/06/2021= with 1 UE 17.93 sec. 02/18/2021= 21.33 sec without UE support. 05/06/2021= 15.3 sec with BUE Support and 21. 0 sec without UE support. 05/13/2021=15.0 sec with BUE support; 16 sec without UE support.    Time 12    Period Weeks    Status Achieved      PT LONG TERM GOAL #4   Title Pt will increase his self-selected 10MWT to >0.74 m/s in order to demonstrate improvement in gait speed and improved community ambulation    Baseline 09/20/20: self-selected: 14.4s = 0.69 m/s with rollator (Previous discharge 03/06/20: self-selected: 13.6s = 0.74 m/s), 12/14: 0.75 m/s, 1/13: 0.78 m/s. 01/02/2021= 0.85 m/s using 4WW, 01/16/21 with 4 WW .99 m/s. 02/06/2021= self selected = 0.93 m/s with 0NU    Time 4    Period Weeks    Status Achieved      PT LONG TERM GOAL #5   Title Patient will increase six minute walk test distance to >1000 for progression to community ambulator and improve gait ability    Baseline 01/10/20  620 ft with rollator, 01/21/21  965 ft; 02/06/2021= 910 feet with 4WW.  02/18/2021= 950 feet with 4WW. 05/06/2021= will test next visit. 05/13/2021- Deferred secondary to increased left knee pain. 06/11/21: 752ft with 2VO *knee pain throughout test. 07/11/2021= 900 feet with use of 4WW.    Time 12  Period Weeks    Status On-going      PT LONG TERM GOAL #6   Title Patient will demonstrate ability to walk with HHA +1 > 200 ft. without loss of balance on level surfaces to simulate walking his grandaughter down the aisle for her upcoming  wedding in November 2022.    Baseline 05/13/2021- Patient currently ambulating short community distance with use of 4WW but desires to walk with his grandaughter with HHA for her wedding. 06/11/21: attempted 5 steps with HHA x1, deemed unsafe. 07/16/2021- Patient ambulated around 100 feet with HHA (arms interlocked) with PT to simulate walking daughter down aisle.    Time 12    Period Weeks    Status On-going      PT LONG TERM GOAL #7   Title Pt will decrease 5TSTS  to 14 sec or less with BUE support on armrest in order to demonstrate clinically significant improvement in LE strength.    Baseline 05/13/2021=5.0 sec with BUE support; 16 sec without UE support. 06/11/21: 14.61 seconds with BUE support. 07/16/2021= 23 sec without UE support.    Time 12    Period Weeks    Status On-going    Target Date 08/05/21                   Plan - 07/25/21 1351     Clinical Impression Statement Treatment limited to mostly seated therex due to patient ongoing back pain. He was able to respond well with all verbal cues and able to participate well with all seated therex. He fatigues quickly so only performed 1 set of each exercise today. He was able to demo increased lumbar flexibility with PROM today. Pt will benefit from further skilled PT to improve BLE strength, balance and functional mobility    Personal Factors and Comorbidities Age;Comorbidity 3+    Comorbidities HTN, CVA, cholecystitis with cholecystostomy drain    Examination-Activity Limitations Bend;Lift;Squat;Locomotion Level;Stairs;Stand    Examination-Participation Restrictions Community Activity;Driving;Laundry;Meal Prep;Shop    Stability/Clinical Decision Making Unstable/Unpredictable    Rehab Potential Fair    PT Frequency 2x / week    PT Duration 12 weeks    PT Treatment/Interventions ADLs/Self Care Home Management;Cryotherapy;Electrical Stimulation;Iontophoresis 4mg /ml Dexamethasone;Moist Heat;Traction;Ultrasound;DME Instruction;Gait  training;Stair training;Therapeutic activities;Therapeutic exercise;Functional mobility training;Balance training;Neuromuscular re-education;Patient/family education;Manual techniques;Dry needling;Vestibular;Joint Manipulations;Spinal Manipulations;Aquatic Therapy    PT Next Visit Plan Continue to focus on LE strengthening and balance, dynamic balance tasks    PT Home Exercise Plan no changes    Consulted and Agree with Plan of Care Patient             Patient will benefit from skilled therapeutic intervention in order to improve the following deficits and impairments:  Abnormal gait, Decreased balance, Decreased mobility, Difficulty walking, Decreased knowledge of precautions, Decreased safety awareness, Decreased activity tolerance, Decreased strength, Decreased endurance  Visit Diagnosis: Abnormality of gait and mobility  Difficulty in walking, not elsewhere classified  Muscle weakness (generalized)  Other lack of coordination     Problem List Patient Active Problem List   Diagnosis Date Noted   Localized osteoarthritis of knees, bilateral 06/13/2021   Moderate protein-calorie malnutrition ([place]) 03/11/2021   Hemiparesis affecting left side as late effect of cerebrovascular accident (CVA) ([place]) 03/11/2021   Aneurysm, aorta, thoracic ([place]) 12/21/2020   Calculus of bile duct without cholecystitis and without obstruction    Non-small cell carcinoma of lung, right ([place]) 09/07/2020   Mass of upper lobe of right lung 07/13/2020   Calculus of bile  duct with cholecystitis without obstruction    Abnormal findings on imaging of biliary tract    Acute cholecystitis 05/02/2020   Chronic venous insufficiency 12/05/2019   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    CKD (chronic kidney disease), stage II    New onset atrial fibrillation (HCC)    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   Goals of care, counseling/discussion    Palliative care by specialist    DNR (do  not resuscitate) discussion    History of CVA (cerebrovascular accident) 05/22/2019   Persistent proteinuria 07/30/2018   Mobitz type 2 second degree heart block 07/13/2018   Chronic kidney disease, stage III (moderate) ([place]) 06/15/2018   Leg pain 05/06/2018   Lymphedema 05/06/2018   Coagulopathy ([place]) 07/07/2017   Lumbar spondylosis 03/10/2017   Elevated uric acid in blood 11/20/2016   BPH (benign prostatic hyperplasia) 05/29/2016   Peripheral vascular disease of lower extremity ([place]) 03/03/2016   Gallstone 12/31/2015   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   Atherosclerosis of aorta ([place]) 11/22/2015   Carotid artery narrowing 11/22/2015   Diverticulosis of colon 11/22/2015   Decreased creatinine clearance 11/22/2015   Arthritis, degenerative 11/22/2015   Lactose intolerance 11/22/2015   Basal cell carcinoma 11/22/2015   Essential hypertension 06/21/2015   Hyperlipemia 06/21/2015   GERD (gastroesophageal reflux disease) 06/21/2015   Calculus of kidney 11/18/2013    Lewis Moccasin, PT 07/25/2021, 2:56 PM  [place] MAIN [facility] SERVICES [place], [place], [address] Phone: [phone]   Fax:  [phone]  Name: Jose Castro MRN: [id number] Date of Birth: 07/31/1926

## 2021-07-30 ENCOUNTER — Other Ambulatory Visit: Payer: Self-pay

## 2021-07-30 ENCOUNTER — Ambulatory Visit: Payer: Medicare Other | Admitting: Physical Therapy

## 2021-07-30 DIAGNOSIS — R278 Other lack of coordination: Secondary | ICD-10-CM

## 2021-07-30 DIAGNOSIS — R5383 Other fatigue: Secondary | ICD-10-CM

## 2021-07-30 DIAGNOSIS — R29898 Other symptoms and signs involving the musculoskeletal system: Secondary | ICD-10-CM

## 2021-07-30 DIAGNOSIS — R2681 Unsteadiness on feet: Secondary | ICD-10-CM

## 2021-07-30 DIAGNOSIS — R2689 Other abnormalities of gait and mobility: Secondary | ICD-10-CM

## 2021-07-30 DIAGNOSIS — I63511 Cerebral infarction due to unspecified occlusion or stenosis of right middle cerebral artery: Secondary | ICD-10-CM

## 2021-07-30 DIAGNOSIS — R269 Unspecified abnormalities of gait and mobility: Secondary | ICD-10-CM

## 2021-07-30 DIAGNOSIS — M6281 Muscle weakness (generalized): Secondary | ICD-10-CM

## 2021-07-30 DIAGNOSIS — R262 Difficulty in walking, not elsewhere classified: Secondary | ICD-10-CM

## 2021-07-30 DIAGNOSIS — M79674 Pain in right toe(s): Secondary | ICD-10-CM

## 2021-07-30 DIAGNOSIS — M25511 Pain in right shoulder: Secondary | ICD-10-CM

## 2021-07-30 NOTE — Therapy (Signed)
[place] MAIN [facility] SERVICES [address] [place], [place], [address] Phone: [phone]   Fax:  [phone]  Physical Therapy Treatment  Patient Details  Name: Jose Castro MRN: [id number] Date of Birth: 1926-02-05 Referring Provider (PT): Dr. Ancil Boozer   Encounter Date: 07/30/2021   PT End of Session - 07/30/21 1546     Visit Number 13    Number of Visits 51    Date for PT Re-Evaluation 08/05/21    Authorization Type eval: 09/20/20, Progress note 01/02/2021, PN on 12/10/5091, Recert = 2/67/1245-07/09/9832; PN on 4/202022; PN on 07/02/5052; Recert= 9/76/73419-02/05/9023; PN on 06/11/21; PN on 07/16/2021    PT Start Time 1346    PT Stop Time 1430    PT Time Calculation (min) 44 min    Equipment Utilized During Treatment Gait belt    Activity Tolerance Patient tolerated treatment well    Behavior During Therapy WFL for tasks assessed/performed             Past Medical History:  Diagnosis Date   Allergy    Penicillin   Arthritis    Basal cell carcinoma    BPH (benign prostatic hyperplasia)    Chronic kidney disease    had a kidney stone which per family was a cyst that was removed   Dysrhythmia    GERD (gastroesophageal reflux disease)    History of kidney stones    Hyperlipidemia    Hypertension    Left-sided low back pain with left-sided sciatica    Lung mass    Splenic vein thrombosis    Stroke ([place]) 05/22/2019   Thrombosis 12/2015   mural  area and no notation of heart attack    Past Surgical History:  Procedure Laterality Date   APPENDECTOMY     CATARACT EXTRACTION, BILATERAL     CYSTOSCOPY WITH INSERTION OF UROLIFT     ENDOSCOPIC RETROGRADE CHOLANGIOPANCREATOGRAPHY (ERCP) WITH PROPOFOL N/A 05/11/2020   Procedure: ENDOSCOPIC RETROGRADE CHOLANGIOPANCREATOGRAPHY (ERCP) WITH PROPOFOL;  Surgeon: [name], MD;  Location: [facility] ENDOSCOPY;  Service: Endoscopy;  Laterality: N/A;   ERCP N/A 10/23/2020   Procedure: ENDOSCOPIC  RETROGRADE CHOLANGIOPANCREATOGRAPHY (ERCP);  Surgeon: [name], MD;  Location: [facility] ENDOSCOPY;  Service: Endoscopy;  Laterality: N/A;   EYE SURGERY  08/2018   IR EXCHANGE BILIARY DRAIN  05/04/2020   KIDNEY STONE SURGERY     KNEE SURGERY     PACEMAKER INSERTION N/A 07/13/2018   Procedure: INSERTION PACEMAKER-DUEL CHAMBER INITIAL IMPLANT;  Surgeon: Isaias Cowman, MD;  Location: [facility] ORS;  Service: Cardiovascular;  Laterality: N/A;   SPINE SURGERY      There were no vitals filed for this visit.   Subjective Assessment - 07/30/21 1348     Subjective Patient reports a 1/10 LBP today. Denies painin L knee however states that will change during therapy. Overall, states he is doing well today.    Patient is accompained by: Family member    Pertinent History Pt referred for deconditioning and gait instability. He has had a decline since he last finished therapy. Pt had a hospital admission from 05/02/20 to 05/14/20 for acute cholecystitis and is now s/p cholecystostomy drain placement. Drain is still in place and pt is waiting to hear when they plan to remove it. Pt discharged to SNF and then returned home. In addition pt underwent radiation therapy for a RUL lung mass/cancer and had his last radiation treatment in September of 2021. He reports that he has continued to walk at  home as his primary exercise. Pt was previously seen at this clinic for PT/OT due to weakness/imbalance s/p CVA. Prior history from 09/21/19: Pt is a 87 year old male who presents with imbalance and difficulty with gait following a right frontal CVA with left hemiparesis on 05/22/19.  He has completed inpatient and home health PT, and now presents for OP PT.  He was discharged from inpatient rehab at a supervision level of assistance, ambulating with a RW.  His family notes that he is impulsive and will try to ambulate without his RW at home.  Family is currently providing 24/7 supervision.    Limitations House hold  activities;Walking    How long can you sit comfortably? no limitations    How long can you walk comfortably? he requires a RW    Patient Stated Goals Pt wants to be able to walk his granddaughter down the aisle at her wedding November 2022    Currently in Pain? Yes    Pain Score 1     Pain Location Back    Pain Orientation Lower    Pain Descriptors / Indicators Aching    Pain Onset More than a month ago              Interventions:    Therapeutic Exercises:      Nustep L3 with LE only x 5 min - Cues for full ROM; SPM remained 60-70.    Patient ambulated a total of 140 feet with HHA x1-2 (arms interlocked) with PT to simulate walking granddaughter down aisle. He was min-moderately unsteady with increased drag of LLE with fatigue due to weakness and discomfort of left hip. Significantly decreased gait velocity. Seated rest break midway - 58ft w/ HHA x1 followed by break and 75ft w/ HHA x2 for remainder of walk. Decreased steadiness/safety and increased pain with final 58ft.  Large swiss ball forward flexion 15 x 3-5 second holds ;  Due to increase in hip and back pain with activity, performed PROM/stretching to BLE for total of 10 minutes including: Single knee to chest, Hamstring, Piriformis, Lower trunk rotation- left to right  Patient reports improved comfort after stretches.    Seated hip flex with 5lb BLE x 12 reps each Seated knee ext with 5lb 5LE x 12 reps each   Increased seated rest breaks required throughout session due to L hip irritation after practicing ambulation w/o Rollator (training pt to walk granddaughter down the aisle).  Education provided throughout session via VC/TC and demonstration to facilitate movement at target joints and correct muscle activation for all testing and exercises performed.    Clinical Impression: Pt arrived with excellent motivation to today's treatment session. He was able to ambulate 46ft with HHA x1 before asking for a seated rest  break. Minimal unsteadiness with forward ambulation, moderate unsteadiness with turns and stand pivot to sit. During second bout of ambulation for 70ft, stability, foot clearance and LLE stance time all decreased - for pt safety, pt daughter was asked to step in to provide HHA x2. He was able to demo increased lumbar flexibility and improved comfort of L hip with PROM today. Pt will benefit from further skilled PT to improve BLE strength, balance and functional mobility.       PT Short Term Goals - 01/21/21 1438       PT SHORT TERM GOAL #1   Title Pt will be independent with HEP in order to improve strength and balance in order to decrease fall risk and improve  function at home.    Baseline 01/02/2021- Patient verbalize understanding of walking and seated exercises for home program. 01/21/21 patient has been more active and understands HEP.    Time 6    Period Weeks    Status Achieved    Target Date 01/10/21               PT Long Term Goals - 07/16/21 1405       PT LONG TERM GOAL #1   Title Pt will improve BERG by at least 3 points in order to demonstrate clinically significant improvement in balance.    Baseline 09/20/20: 32/56 (Previous discharge: 03/06/20: 46/56), 12/14: 32/56, 1/13: 31/56, 01/16/21 35/56, 02/06/2021=36/56. 02/18/2021= 37/56.03/20/2021- Deferred secondary to left knee aching- will attempt next visit. 05/06/2021= 39/56; 05/13/2021= 36/56 *patient having more left knee pain. 07/16/2021= 40/56    Time 12    Period Weeks    Status Achieved      PT LONG TERM GOAL #2   Title Pt will decrease TUG to below 14 seconds/decrease in order to demonstrate decreased fall risk.    Baseline 09/20/20: 19.2s, 12/14: 17.48, 1/13: 16.65 sec. 01/02/2021=15.75 sec using 4WW, 01/21/21  14.37 seconds. 02/06/2021= 14.92 sec using 4WW. 02/18/2021= 14.5 sec using 4WW. 03/20/2021- Deferred secondary to left knee aching- will attempt next visit. 05/06/2021= 16.32 sec avg using 4WW. 05/13/2021= 16.5 sec with 4WW  *increased left knee pain. 06/11/21: 20.11 seconds (increased time to turn) using 4WW;    Time 12    Period Weeks    Status On-going      PT LONG TERM GOAL #3   Title Pt will decrease 5TSTS by at least 3 seconds in order to demonstrate clinically significant improvement in LE strength.    Baseline 09/20/20: 23.1s with minA+1 for strength and balance (Previous discharge 03/06/20: 16.1s), 12/14: 20 sec, 1/13: 22 sec.   01/02/2021= 16.05 sec with light UE support. 01/21/21 01/21/21 37.90 seconds without UE, with UE support 12.93 seconds: 02/06/2021= with 1 UE 17.93 sec. 02/18/2021= 21.33 sec without UE support. 05/06/2021= 15.3 sec with BUE Support and 21. 0 sec without UE support. 05/13/2021=15.0 sec with BUE support; 16 sec without UE support.    Time 12    Period Weeks    Status Achieved      PT LONG TERM GOAL #4   Title Pt will increase his self-selected 10MWT to >0.74 m/s in order to demonstrate improvement in gait speed and improved community ambulation    Baseline 09/20/20: self-selected: 14.4s = 0.69 m/s with rollator (Previous discharge 03/06/20: self-selected: 13.6s = 0.74 m/s), 12/14: 0.75 m/s, 1/13: 0.78 m/s. 01/02/2021= 0.85 m/s using 4WW, 01/16/21 with 4 WW .99 m/s. 02/06/2021= self selected = 0.93 m/s with 3KZ    Time 4    Period Weeks    Status Achieved      PT LONG TERM GOAL #5   Title Patient will increase six minute walk test distance to >1000 for progression to community ambulator and improve gait ability    Baseline 01/10/20  620 ft with rollator, 01/21/21  965 ft; 02/06/2021= 910 feet with 4WW.  02/18/2021= 950 feet with 4WW. 05/06/2021= will test next visit. 05/13/2021- Deferred secondary to increased left knee pain. 06/11/21: 79ft with 6WF *knee pain throughout test. 07/11/2021= 900 feet with use of 4WW.    Time 12    Period Weeks    Status On-going      PT LONG TERM GOAL #6   Title Patient will demonstrate  ability to walk with HHA +1 > 200 ft. without loss of balance on level surfaces to  simulate walking his grandaughter down the aisle for her upcoming wedding in November 2022.    Baseline 05/13/2021- Patient currently ambulating short community distance with use of 4WW but desires to walk with his grandaughter with HHA for her wedding. 06/11/21: attempted 5 steps with HHA x1, deemed unsafe. 07/16/2021- Patient ambulated around 100 feet with HHA (arms interlocked) with PT to simulate walking daughter down aisle.    Time 12    Period Weeks    Status On-going      PT LONG TERM GOAL #7   Title Pt will decrease 5TSTS  to 14 sec or less with BUE support on armrest in order to demonstrate clinically significant improvement in LE strength.    Baseline 05/13/2021=5.0 sec with BUE support; 16 sec without UE support. 06/11/21: 14.61 seconds with BUE support. 07/16/2021= 23 sec without UE support.    Time 12    Period Weeks    Status On-going    Target Date 08/05/21                   Plan - 07/30/21 1546     Clinical Impression Statement Pt arrived with excellent motivation to today's treatment session. He was able to ambulate 83ft with HHA x1 before asking for a seated rest break. Minimal unsteadiness with forward ambulation, moderate unsteadiness with turns and stand pivot to sit. During second bout of ambulation for 77ft, stability, foot clearance and LLE stance time all decreased - for pt safety, pt daughter was asked to step in to provide HHA x2. He was able to demo increased lumbar flexibility and improved comfort of L hip with PROM today. Pt will benefit from further skilled PT to improve BLE strength, balance and functional mobility.    Personal Factors and Comorbidities Age;Comorbidity 3+    Comorbidities HTN, CVA, cholecystitis with cholecystostomy drain    Examination-Activity Limitations Bend;Lift;Squat;Locomotion Level;Stairs;Stand    Examination-Participation Restrictions Community Activity;Driving;Laundry;Meal Prep;Shop    Stability/Clinical Decision Making  Unstable/Unpredictable    Rehab Potential Fair    PT Frequency 2x / week    PT Duration 12 weeks    PT Treatment/Interventions ADLs/Self Care Home Management;Cryotherapy;Electrical Stimulation;Iontophoresis 4mg /ml Dexamethasone;Moist Heat;Traction;Ultrasound;DME Instruction;Gait training;Stair training;Therapeutic activities;Therapeutic exercise;Functional mobility training;Balance training;Neuromuscular re-education;Patient/family education;Manual techniques;Dry needling;Vestibular;Joint Manipulations;Spinal Manipulations;Aquatic Therapy    PT Next Visit Plan Continue to focus on LE strengthening and balance, dynamic balance tasks    PT Home Exercise Plan no changes    Consulted and Agree with Plan of Care Patient             Patient will benefit from skilled therapeutic intervention in order to improve the following deficits and impairments:  Abnormal gait, Decreased balance, Decreased mobility, Difficulty walking, Decreased knowledge of precautions, Decreased safety awareness, Decreased activity tolerance, Decreased strength, Decreased endurance  Visit Diagnosis: Abnormality of gait and mobility  Difficulty in walking, not elsewhere classified  Muscle weakness (generalized)  Other lack of coordination  Other abnormalities of gait and mobility  Unsteadiness on feet  Right middle cerebral artery stroke (HCC)  Decreased strength of lower extremity  Acute pain of right shoulder  Great toe pain, right  Lethargy     Problem List Patient Active Problem List   Diagnosis Date Noted   Localized osteoarthritis of knees, bilateral 06/13/2021   Moderate protein-calorie malnutrition ([place]) 03/11/2021   Hemiparesis affecting left side as late effect of cerebrovascular accident (  CVA) ([place]) 03/11/2021   Aneurysm, aorta, thoracic ([place]) 12/21/2020   Calculus of bile duct without cholecystitis and without obstruction    Non-small cell carcinoma of lung, right ([place]) 09/07/2020   Mass  of upper lobe of right lung 07/13/2020   Calculus of bile duct with cholecystitis without obstruction    Abnormal findings on imaging of biliary tract    Acute cholecystitis 05/02/2020   Chronic venous insufficiency 12/05/2019   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    CKD (chronic kidney disease), stage II    New onset atrial fibrillation ([place])    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   Goals of care, counseling/discussion    Palliative care by specialist    DNR (do not resuscitate) discussion    History of CVA (cerebrovascular accident) 05/22/2019   Persistent proteinuria 07/30/2018   Mobitz type 2 second degree heart block 07/13/2018   Chronic kidney disease, stage III (moderate) ([place]) 06/15/2018   Leg pain 05/06/2018   Lymphedema 05/06/2018   Coagulopathy ([place]) 07/07/2017   Lumbar spondylosis 03/10/2017   Elevated uric acid in blood 11/20/2016   BPH (benign prostatic hyperplasia) 05/29/2016   Peripheral vascular disease of lower extremity ([place]) 03/03/2016   Gallstone 12/31/2015   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   Atherosclerosis of aorta ([place]) 11/22/2015   Carotid artery narrowing 11/22/2015   Diverticulosis of colon 11/22/2015   Decreased creatinine clearance 11/22/2015   Arthritis, degenerative 11/22/2015   Lactose intolerance 11/22/2015   Basal cell carcinoma 11/22/2015   Essential hypertension 06/21/2015   Hyperlipemia 06/21/2015   GERD (gastroesophageal reflux disease) 06/21/2015   Calculus of kidney 11/18/2013    Patrina Levering PT, DPT  [facility] 07/30/2021, 3:48 PM  [place] [facility] MAIN [facility] SERVICES [address] [place], [place], [address] Phone: [phone]   Fax:  [phone]  Name: Jose Castro MRN: [id number] Date of Birth: Feb 06, 1926

## 2021-08-01 ENCOUNTER — Other Ambulatory Visit: Payer: Self-pay

## 2021-08-01 ENCOUNTER — Ambulatory Visit: Payer: Medicare Other | Attending: Family Medicine

## 2021-08-01 DIAGNOSIS — R262 Difficulty in walking, not elsewhere classified: Secondary | ICD-10-CM | POA: Insufficient documentation

## 2021-08-01 DIAGNOSIS — R278 Other lack of coordination: Secondary | ICD-10-CM | POA: Diagnosis present

## 2021-08-01 DIAGNOSIS — M6281 Muscle weakness (generalized): Secondary | ICD-10-CM | POA: Insufficient documentation

## 2021-08-01 DIAGNOSIS — R269 Unspecified abnormalities of gait and mobility: Secondary | ICD-10-CM | POA: Insufficient documentation

## 2021-08-01 DIAGNOSIS — R2681 Unsteadiness on feet: Secondary | ICD-10-CM | POA: Diagnosis present

## 2021-08-01 NOTE — Therapy (Signed)
[place] MAIN [facility] SERVICES [address] [place], [place], [address] Phone: [phone]   Fax:  [phone]  Physical Therapy Treatment  Patient Details  Name: Jose Castro MRN: [id number] Date of Birth: 1926/01/08 Referring Provider (PT): Dr. Ancil Boozer   Encounter Date: 08/01/2021   PT End of Session - 08/01/21 1353     Visit Number 62    Number of Visits 54    Date for PT Re-Evaluation 08/05/21    Authorization Type eval: 09/20/20, Progress note 01/02/2021, PN on 04/06/176, Recert = 9/39/0300-08/23/3006; PN on 4/202022; PN on 05/02/2632; Recert= 3/54/56256-02/06/9372; PN on 06/11/21; PN on 07/16/2021    PT Start Time 1340    PT Stop Time 1429    PT Time Calculation (min) 49 min    Equipment Utilized During Treatment Gait belt    Activity Tolerance Patient tolerated treatment well    Behavior During Therapy WFL for tasks assessed/performed             Past Medical History:  Diagnosis Date   Allergy    Penicillin   Arthritis    Basal cell carcinoma    BPH (benign prostatic hyperplasia)    Chronic kidney disease    had a kidney stone which per family was a cyst that was removed   Dysrhythmia    GERD (gastroesophageal reflux disease)    History of kidney stones    Hyperlipidemia    Hypertension    Left-sided low back pain with left-sided sciatica    Lung mass    Splenic vein thrombosis    Stroke ([place]) 05/22/2019   Thrombosis 12/2015   mural  area and no notation of heart attack    Past Surgical History:  Procedure Laterality Date   APPENDECTOMY     CATARACT EXTRACTION, BILATERAL     CYSTOSCOPY WITH INSERTION OF UROLIFT     ENDOSCOPIC RETROGRADE CHOLANGIOPANCREATOGRAPHY (ERCP) WITH PROPOFOL N/A 05/11/2020   Procedure: ENDOSCOPIC RETROGRADE CHOLANGIOPANCREATOGRAPHY (ERCP) WITH PROPOFOL;  Surgeon: [name], MD;  Location: [facility] ENDOSCOPY;  Service: Endoscopy;  Laterality: N/A;   ERCP N/A 10/23/2020   Procedure: ENDOSCOPIC  RETROGRADE CHOLANGIOPANCREATOGRAPHY (ERCP);  Surgeon: [name], MD;  Location: [facility] ENDOSCOPY;  Service: Endoscopy;  Laterality: N/A;   EYE SURGERY  08/2018   IR EXCHANGE BILIARY DRAIN  05/04/2020   KIDNEY STONE SURGERY     KNEE SURGERY     PACEMAKER INSERTION N/A 07/13/2018   Procedure: INSERTION PACEMAKER-DUEL CHAMBER INITIAL IMPLANT;  Surgeon: Isaias Cowman, MD;  Location: [facility] ORS;  Service: Cardiovascular;  Laterality: N/A;   SPINE SURGERY      There were no vitals filed for this visit.   Subjective Assessment - 08/01/21 1348     Subjective Patient reports doing well today without complaint of any low back or knee pain. States he is actually feeling pretty good.    Patient is accompained by: Family member    Pertinent History Pt referred for deconditioning and gait instability. He has had a decline since he last finished therapy. Pt had a hospital admission from 05/02/20 to 05/14/20 for acute cholecystitis and is now s/p cholecystostomy drain placement. Drain is still in place and pt is waiting to hear when they plan to remove it. Pt discharged to SNF and then returned home. In addition pt underwent radiation therapy for a RUL lung mass/cancer and had his last radiation treatment in September of 2021. He reports that he has continued to walk at home as his  primary exercise. Pt was previously seen at this clinic for PT/OT due to weakness/imbalance s/p CVA. Prior history from 09/21/19: Pt is a 87 year old male who presents with imbalance and difficulty with gait following a right frontal CVA with left hemiparesis on 05/22/19.  He has completed inpatient and home health PT, and now presents for OP PT.  He was discharged from inpatient rehab at a supervision level of assistance, ambulating with a RW.  His family notes that he is impulsive and will try to ambulate without his RW at home.  Family is currently providing 24/7 supervision.    Limitations House hold activities;Walking    How long can  you sit comfortably? no limitations    How long can you walk comfortably? he requires a RW    Patient Stated Goals Pt wants to be able to walk his granddaughter down the aisle at her wedding November 2022    Currently in Pain? No/denies    Pain Onset More than a month ago               Interventions:   Therapeutic exercises:   Standing Scap retraction using YTB 2sets of 12 reps  Standing shoulder ext using YTB 2sets of 12 reps  Standing shoulder horizontal ABD using YTB 2sets of 12 reps   Sit to stand without UE support x 8 and remaining 4 reps with min UE suppport.   Hamstring stretching in seated position- hold 20 sec x 3 sets each LE.  Utilizing Rolling stick to work out some stiffness/tightness in B hamstrings. Patient able to follow instructions and utilize the stick independently.  Education provided throughout session via VC/TC and demonstration to facilitate movement at target joints and correct muscle activation for all testing and exercises performed.   Treadmill at 0.8 mph to 1.0 mph with BUE Support x 5 min total. Focusing on taking longer steps and increased cadence. Patient struggled to maintain 1.0 mph pace so decreased down to 0.8 mph. CGA and reminders to not slow down or stop.   Neuromuscular re-ed:   Standing with 1 LE on blue airex pad and opp LE on 6" step positioned directly in front of pad. Dynamic step tapping without UE support.   Static stand on blue airex pad with dynamic tapping 1 foot onto 3 positioned cones with initially 1 UE support followed by a few reps (5-6) without UE support (poor ability to maintain balance) requiring close CGA.   Clinical Impression: Patient performed well with balance while performing standing posture exercises- Increased cues for posture. He appeared more fatigued overall today requiring increased Verbal cues and he was much less verbal overall today. He had 1 LOB on TM and required pace to decrease. Pt will benefit from  further skilled PT to improve BLE strength, balance and functional mobility                       PT Education - 08/01/21 1353     Education Details Postural strengthening technique    Person(s) Educated Patient    Methods Explanation;Demonstration;Tactile cues;Verbal cues    Comprehension Verbalized understanding;Returned demonstration;Verbal cues required;Tactile cues required;Need further instruction              PT Short Term Goals - 01/21/21 1438       PT SHORT TERM GOAL #1   Title Pt will be independent with HEP in order to improve strength and balance in order to decrease fall risk and improve  function at home.    Baseline 01/02/2021- Patient verbalize understanding of walking and seated exercises for home program. 01/21/21 patient has been more active and understands HEP.    Time 6    Period Weeks    Status Achieved    Target Date 01/10/21               PT Long Term Goals - 07/16/21 1405       PT LONG TERM GOAL #1   Title Pt will improve BERG by at least 3 points in order to demonstrate clinically significant improvement in balance.    Baseline 09/20/20: 32/56 (Previous discharge: 03/06/20: 46/56), 12/14: 32/56, 1/13: 31/56, 01/16/21 35/56, 02/06/2021=36/56. 02/18/2021= 37/56.03/20/2021- Deferred secondary to left knee aching- will attempt next visit. 05/06/2021= 39/56; 05/13/2021= 36/56 *patient having more left knee pain. 07/16/2021= 40/56    Time 12    Period Weeks    Status Achieved      PT LONG TERM GOAL #2   Title Pt will decrease TUG to below 14 seconds/decrease in order to demonstrate decreased fall risk.    Baseline 09/20/20: 19.2s, 12/14: 17.48, 1/13: 16.65 sec. 01/02/2021=15.75 sec using 4WW, 01/21/21  14.37 seconds. 02/06/2021= 14.92 sec using 4WW. 02/18/2021= 14.5 sec using 4WW. 03/20/2021- Deferred secondary to left knee aching- will attempt next visit. 05/06/2021= 16.32 sec avg using 4WW. 05/13/2021= 16.5 sec with 4WW *increased left knee pain.  06/11/21: 20.11 seconds (increased time to turn) using 4WW;    Time 12    Period Weeks    Status On-going      PT LONG TERM GOAL #3   Title Pt will decrease 5TSTS by at least 3 seconds in order to demonstrate clinically significant improvement in LE strength.    Baseline 09/20/20: 23.1s with minA+1 for strength and balance (Previous discharge 03/06/20: 16.1s), 12/14: 20 sec, 1/13: 22 sec.   01/02/2021= 16.05 sec with light UE support. 01/21/21 01/21/21 37.90 seconds without UE, with UE support 12.93 seconds: 02/06/2021= with 1 UE 17.93 sec. 02/18/2021= 21.33 sec without UE support. 05/06/2021= 15.3 sec with BUE Support and 21. 0 sec without UE support. 05/13/2021=15.0 sec with BUE support; 16 sec without UE support.    Time 12    Period Weeks    Status Achieved      PT LONG TERM GOAL #4   Title Pt will increase his self-selected 10MWT to >0.74 m/s in order to demonstrate improvement in gait speed and improved community ambulation    Baseline 09/20/20: self-selected: 14.4s = 0.69 m/s with rollator (Previous discharge 03/06/20: self-selected: 13.6s = 0.74 m/s), 12/14: 0.75 m/s, 1/13: 0.78 m/s. 01/02/2021= 0.85 m/s using 4WW, 01/16/21 with 4 WW .99 m/s. 02/06/2021= self selected = 0.93 m/s with 1BP    Time 4    Period Weeks    Status Achieved      PT LONG TERM GOAL #5   Title Patient will increase six minute walk test distance to >1000 for progression to community ambulator and improve gait ability    Baseline 01/10/20  620 ft with rollator, 01/21/21  965 ft; 02/06/2021= 910 feet with 4WW.  02/18/2021= 950 feet with 4WW. 05/06/2021= will test next visit. 05/13/2021- Deferred secondary to increased left knee pain. 06/11/21: 712ft with 1WC *knee pain throughout test. 07/11/2021= 900 feet with use of 4WW.    Time 12    Period Weeks    Status On-going      PT LONG TERM GOAL #6   Title Patient will demonstrate  ability to walk with HHA +1 > 200 ft. without loss of balance on level surfaces to simulate walking his  grandaughter down the aisle for her upcoming wedding in November 2022.    Baseline 05/13/2021- Patient currently ambulating short community distance with use of 4WW but desires to walk with his grandaughter with HHA for her wedding. 06/11/21: attempted 5 steps with HHA x1, deemed unsafe. 07/16/2021- Patient ambulated around 100 feet with HHA (arms interlocked) with PT to simulate walking daughter down aisle.    Time 12    Period Weeks    Status On-going      PT LONG TERM GOAL #7   Title Pt will decrease 5TSTS  to 14 sec or less with BUE support on armrest in order to demonstrate clinically significant improvement in LE strength.    Baseline 05/13/2021=5.0 sec with BUE support; 16 sec without UE support. 06/11/21: 14.61 seconds with BUE support. 07/16/2021= 23 sec without UE support.    Time 12    Period Weeks    Status On-going    Target Date 08/05/21                   Plan - 08/01/21 1353     Clinical Impression Statement Patient performed well with balance while performing standing posture exercises- Increased cues for posture. He appeared more fatigued overall today requiring increased Verbal cues and he was much less verbal overall today. He had 1 LOB on TM and required pace to decrease. Pt will benefit from further skilled PT to improve BLE strength, balance and functional mobility    Personal Factors and Comorbidities Age;Comorbidity 3+    Comorbidities HTN, CVA, cholecystitis with cholecystostomy drain    Examination-Activity Limitations Bend;Lift;Squat;Locomotion Level;Stairs;Stand    Examination-Participation Restrictions Community Activity;Driving;Laundry;Meal Prep;Shop    Stability/Clinical Decision Making Unstable/Unpredictable    Rehab Potential Fair    PT Frequency 2x / week    PT Duration 12 weeks    PT Treatment/Interventions ADLs/Self Care Home Management;Cryotherapy;Electrical Stimulation;Iontophoresis 4mg /ml Dexamethasone;Moist Heat;Traction;Ultrasound;DME  Instruction;Gait training;Stair training;Therapeutic activities;Therapeutic exercise;Functional mobility training;Balance training;Neuromuscular re-education;Patient/family education;Manual techniques;Dry needling;Vestibular;Joint Manipulations;Spinal Manipulations;Aquatic Therapy    PT Next Visit Plan Continue to focus on LE strengthening and balance, dynamic balance tasks    PT Home Exercise Plan no changes    Consulted and Agree with Plan of Care Patient             Patient will benefit from skilled therapeutic intervention in order to improve the following deficits and impairments:  Abnormal gait, Decreased balance, Decreased mobility, Difficulty walking, Decreased knowledge of precautions, Decreased safety awareness, Decreased activity tolerance, Decreased strength, Decreased endurance  Visit Diagnosis: Abnormality of gait and mobility  Difficulty in walking, not elsewhere classified  Muscle weakness (generalized)  Unsteadiness on feet     Problem List Patient Active Problem List   Diagnosis Date Noted   Localized osteoarthritis of knees, bilateral 06/13/2021   Moderate protein-calorie malnutrition ([place]) 03/11/2021   Hemiparesis affecting left side as late effect of cerebrovascular accident (CVA) ([place]) 03/11/2021   Aneurysm, aorta, thoracic ([place]) 12/21/2020   Calculus of bile duct without cholecystitis and without obstruction    Non-small cell carcinoma of lung, right ([place]) 09/07/2020   Mass of upper lobe of right lung 07/13/2020   Calculus of bile duct with cholecystitis without obstruction    Abnormal findings on imaging of biliary tract    Acute cholecystitis 05/02/2020   Chronic venous insufficiency 12/05/2019   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    CKD (chronic kidney disease), stage II    New onset atrial fibrillation (HCC)    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   Goals of care, counseling/discussion    Palliative care by specialist     DNR (do not resuscitate) discussion    History of CVA (cerebrovascular accident) 05/22/2019   Persistent proteinuria 07/30/2018   Mobitz type 2 second degree heart block 07/13/2018   Chronic kidney disease, stage III (moderate) ([place]) 06/15/2018   Leg pain 05/06/2018   Lymphedema 05/06/2018   Coagulopathy ([place]) 07/07/2017   Lumbar spondylosis 03/10/2017   Elevated uric acid in blood 11/20/2016   BPH (benign prostatic hyperplasia) 05/29/2016   Peripheral vascular disease of lower extremity ([place]) 03/03/2016   Gallstone 12/31/2015   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   Atherosclerosis of aorta ([place]) 11/22/2015   Carotid artery narrowing 11/22/2015   Diverticulosis of colon 11/22/2015   Decreased creatinine clearance 11/22/2015   Arthritis, degenerative 11/22/2015   Lactose intolerance 11/22/2015   Basal cell carcinoma 11/22/2015   Essential hypertension 06/21/2015   Hyperlipemia 06/21/2015   GERD (gastroesophageal reflux disease) 06/21/2015   Calculus of kidney 11/18/2013    Lewis Moccasin, PT 08/02/2021, 12:02 PM  [place] MAIN [facility] SERVICES [address] [place], [place], [address] Phone: [phone]   Fax:  [phone]  Name: Jose Castro MRN: [id number] Date of Birth: [birth year]

## 2021-08-05 ENCOUNTER — Telehealth: Payer: Medicare Other | Admitting: Emergency Medicine

## 2021-08-05 DIAGNOSIS — U071 COVID-19: Secondary | ICD-10-CM

## 2021-08-05 MED ORDER — MOLNUPIRAVIR EUA 200MG CAPSULE: 4.0000 | ORAL_CAPSULE | Freq: Two times a day (BID) | ORAL | 0 refills | Status: AC

## 2021-08-05 NOTE — Patient Instructions (Signed)
Please keep well-hydrated and get plenty of rest. Start a saline nasal rinse to flush out your nasal passages. You can use plain Mucinex to help thin congestion (or Coricidin HBP as you planned) I want you to start OTC vitamin D3 1000 units daily, vitamin C 1000 mg daily, and a zinc supplement. Please take prescribed medications as directed.  Patient with multiple risk factors for complicated course of illness. Discussed risks/benefits of antiviral medications including most common potential ADRs. Patient voiced understanding and would like to proceed with antiviral medication. They are candidate for molnupiravir. Rx sent to pharmacy. Supportive measures, OTC medications and vitamin regimen reviewed. Strict ER precautions discussed with patient.   You were to quarantine for 5 days from onset of your symptoms.  After day 5, if you have had no fever and you are feeling better, you can end quarantine but need to mask for an additional 5 days. After day 5 if you have a fever or are having significant symptoms, please quarantine for full 10 days.  If you note any worsening of symptoms, any significant shortness of breath or any chest pain, please seek ER evaluation ASAP.  Please do not delay care!

## 2021-08-05 NOTE — Progress Notes (Signed)
Virtual Visit Consent   Jose Castro, you are scheduled for a virtual visit with a [facility] provider today.     Just as with appointments in the office, your consent must be obtained to participate.  Your consent will be active for this visit and any virtual visit you may have with one of our providers in the next 365 days.     If you have a MyChart account, a copy of this consent can be sent to you electronically.  All virtual visits are billed to your insurance company just like a traditional visit in the office.    As this is a virtual visit, video technology does not allow for your provider to perform a traditional examination.  This may limit your provider's ability to fully assess your condition.  If your provider identifies any concerns that need to be evaluated in person or the need to arrange testing (such as labs, EKG, etc.), we will make arrangements to do so.     Although advances in technology are sophisticated, we cannot ensure that it will always work on either your end or our end.  If the connection with a video visit is poor, the visit may have to be switched to a telephone visit.  With either a video or telephone visit, we are not always able to ensure that we have a secure connection.     I need to obtain your verbal consent now.   Are you willing to proceed with your visit today?    DARSH VANDEVOORT has provided verbal consent on 08/05/2021 for a virtual visit (video or telephone).   Carvel Getting, NP   Date: 08/05/2021 12:00 PM   Virtual Visit via Video Note   I, Carvel Getting, connected with  Jose Castro  ([id number], Oct 31, 1926) and his daughter on 08/05/21 at 12:00 PM EDT by a video-enabled telemedicine application and verified that I am speaking with the correct person using two identifiers.  Location: Patient: Virtual Visit Location Patient: Home Provider: Virtual Visit Location Provider: Home Office   I discussed the limitations of evaluation and management by  telemedicine and the availability of in person appointments. The patient expressed understanding and agreed to proceed.    History of Present Illness: Jose Castro is a 87 y.o. who identifies as a male who was assigned male at birth, and is being seen today for Covid.  Daughter reports patient developed fever and sore throat yesterday and tested positive yesterday for COVID.  Today he has phlegm and congestion, head congestion.  His fever max was 101.5 F which came down to 10 F with Tylenol.  He did receive the 2 initial COVID vaccines but has not yet received a booster.  Patient is not short of breath or in any acute distress  HPI: HPI  Problems:  Patient Active Problem List   Diagnosis Date Noted   Localized osteoarthritis of knees, bilateral 06/13/2021   Moderate protein-calorie malnutrition ([place]) 03/11/2021   Hemiparesis affecting left side as late effect of cerebrovascular accident (CVA) ([place]) 03/11/2021   Aneurysm, aorta, thoracic ([place]) 12/21/2020   Calculus of bile duct without cholecystitis and without obstruction    Non-small cell carcinoma of lung, right (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   Goals of care, counseling/discussion    Palliative care by specialist    DNR (do not resuscitate) discussion    History of CVA (cerebrovascular accident) 05/22/2019   Persistent proteinuria 07/30/2018   Mobitz type 2 second degree heart block 07/13/2018   Chronic kidney disease, stage III (moderate) ([place]) 06/15/2018   Leg pain 05/06/2018   Lymphedema 05/06/2018   Coagulopathy ([place]) 07/07/2017    Lumbar spondylosis 03/10/2017   Elevated uric acid in blood 11/20/2016   BPH (benign prostatic hyperplasia) 05/29/2016   Peripheral vascular disease of lower extremity ([place]) 03/03/2016   Gallstone 12/31/2015   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   Atherosclerosis of aorta ([place]) 11/22/2015   Carotid artery narrowing 11/22/2015   Diverticulosis of colon 11/22/2015   Decreased creatinine clearance 11/22/2015   Arthritis, degenerative 11/22/2015   Lactose intolerance 11/22/2015   Basal cell carcinoma 11/22/2015   Essential hypertension 06/21/2015   Hyperlipemia 06/21/2015   GERD (gastroesophageal reflux disease) 06/21/2015   Calculus of kidney 11/18/2013    Allergies:  Allergies  Allergen Reactions   Penicillins Itching and Swelling    Pt reported arm rash and swelling with penicillin administration while he was in the [employer].  However, pt was able to tolerate IV Unasyn without any problem, so penicillin allergy unlikely to be real or serious.  05/14/2020.   Medications:  Current Outpatient Medications:    molnupiravir EUA 200 mg CAPS, Take 4 capsules (800 mg total) by mouth 2 (two) times daily for 5 days., Disp: 40 capsule, Rfl: 0   ursodiol (ACTIGALL) 300 MG capsule, Take 300 mg by mouth daily., Disp: , Rfl:    acetaminophen (TYLENOL) 325 MG tablet, Take 2 tablets (650 mg total) by mouth every 4 (four) hours as needed for mild pain (or temp > 37.5 C (99.5 F)). (Patient taking differently: Take 650 mg by mouth as needed for mild pain (or temp > 37.5 C (99.5 F)).), Disp:  , Rfl:    apixaban (ELIQUIS) 2.5 MG TABS tablet, Take 1 tablet (2.5 mg total) by mouth 2 (two) times daily., Disp: 60 tablet, Rfl: 0   Cholecalciferol (VITAMIN D3) 50 MCG (2000 UT) TABS, Take 2,000 Units by mouth daily., Disp: 30 tablet, Rfl: 0   Ensure Max Protein (ENSURE MAX PROTEIN) LIQD, Take 330 mLs (11 oz total) by mouth 2 (two) times daily., Disp: , Rfl:    finasteride (PROSCAR) 5  MG tablet, TAKE 1 TABLET BY MOUTH DAILY, Disp: 90 tablet, Rfl: 1   metoprolol succinate (TOPROL XL) 25 MG 24 hr tablet, Take 1 tablet (25 mg total) by mouth daily., Disp: 30 tablet, Rfl: 0   Omega-3 Fatty Acids (FISH OIL) 1000 MG CAPS, Take 1,000 mg by mouth daily. , Disp: , Rfl:    pantoprazole (PROTONIX) 40 MG tablet, TAKE ONE TABLET EVERY DAY, Disp: 90 tablet, Rfl: 3   rosuvastatin (CRESTOR) 40 MG tablet, Take 1 tablet (40 mg total) by mouth daily at 6 PM., Disp: 30 tablet, Rfl: 0  Observations/Objective: Patient is well-developed, well-nourished in no acute distress.  Resting comfortably at home.  Head is normocephalic, atraumatic.  No labored breathing.  Speech is clear and coherent with logical content.  Patient is alert and oriented at baseline.    Assessment and  Plan: 1. COVID-19  Prescribed molnupiravir 800 mg twice a day for 5 days.  Discussed reasons for seeking an accelerated level of care.  Patient and daughter are agreeable with plan.  Follow Up Instructions: I discussed the assessment and treatment plan with the patient. The patient was provided an opportunity to ask questions and all were answered. The patient agreed with the plan and demonstrated an understanding of the instructions.  A copy of instructions were sent to the patient via MyChart.  The patient was advised to call back or seek an in-person evaluation if the symptoms worsen or if the condition fails to improve as anticipated.  Time:  I spent 15 minutes with the patient via telehealth technology discussing the above problems/concerns.    Carvel Getting, NP

## 2021-08-06 ENCOUNTER — Ambulatory Visit: Payer: Medicare Other

## 2021-08-08 ENCOUNTER — Ambulatory Visit: Payer: Medicare Other

## 2021-08-13 ENCOUNTER — Other Ambulatory Visit: Payer: Self-pay

## 2021-08-13 ENCOUNTER — Ambulatory Visit: Payer: Medicare Other

## 2021-08-13 DIAGNOSIS — M6281 Muscle weakness (generalized): Secondary | ICD-10-CM

## 2021-08-13 DIAGNOSIS — R278 Other lack of coordination: Secondary | ICD-10-CM

## 2021-08-13 DIAGNOSIS — R269 Unspecified abnormalities of gait and mobility: Secondary | ICD-10-CM | POA: Diagnosis not present

## 2021-08-13 DIAGNOSIS — R262 Difficulty in walking, not elsewhere classified: Secondary | ICD-10-CM

## 2021-08-14 ENCOUNTER — Telehealth: Payer: Self-pay | Admitting: *Deleted

## 2021-08-14 NOTE — Telephone Encounter (Addendum)
PET auth sent to [facility].  Left message on Lorraine's voice mail explaining that it was sent for authorization and as soon as we get that the PET will be scheduled as well as Dr Olena Leatherwood appointment.

## 2021-08-14 NOTE — Therapy (Signed)
[place] MAIN [facility] SERVICES [address] [place], [place], [address] Phone: [phone]   Fax:  [phone]  Physical Therapy Treatment/ Recertification for dates 08/13/2021-11/05/2021  Patient Details  Name: Jose Castro MRN: [id number] Date of Birth: Jan 06, 1926 Referring Provider (PT): Dr. Ancil Boozer   Encounter Date: 08/13/2021   PT End of Session - 08/13/21 1345     Visit Number 21    Number of Visits 25    Date for PT Re-Evaluation 11/05/21    Authorization Type eval: 09/20/20, Progress note 01/02/2021, PN on 07/09/3809, Recert = 1/75/1025-8/52/7782; PN on 4/202022; PN on 03/03/3535; Recert= 1/44/31540-0/07/6760; PN on 06/11/21; PN on 9/50/9326; Recert on 06/12/4579- 99/07/3381    PT Start Time 1337    PT Stop Time 1413    PT Time Calculation (min) 36 min    Equipment Utilized During Treatment Gait belt    Activity Tolerance Patient tolerated treatment well    Behavior During Therapy WFL for tasks assessed/performed             Past Medical History:  Diagnosis Date   Allergy    Penicillin   Arthritis    Basal cell carcinoma    BPH (benign prostatic hyperplasia)    Chronic kidney disease    had a kidney stone which per family was a cyst that was removed   Dysrhythmia    GERD (gastroesophageal reflux disease)    History of kidney stones    Hyperlipidemia    Hypertension    Left-sided low back pain with left-sided sciatica    Lung mass    Splenic vein thrombosis    Stroke ([place]) 05/22/2019   Thrombosis 12/2015   mural  area and no notation of heart attack    Past Surgical History:  Procedure Laterality Date   APPENDECTOMY     CATARACT EXTRACTION, BILATERAL     CYSTOSCOPY WITH INSERTION OF UROLIFT     ENDOSCOPIC RETROGRADE CHOLANGIOPANCREATOGRAPHY (ERCP) WITH PROPOFOL N/A 05/11/2020   Procedure: ENDOSCOPIC RETROGRADE CHOLANGIOPANCREATOGRAPHY (ERCP) WITH PROPOFOL;  Surgeon: [name], MD;  Location: [facility] ENDOSCOPY;  Service:  Endoscopy;  Laterality: N/A;   ERCP N/A 10/23/2020   Procedure: ENDOSCOPIC RETROGRADE CHOLANGIOPANCREATOGRAPHY (ERCP);  Surgeon: [name], MD;  Location: [facility]COPY;  Service: Endoscopy;  Laterality: N/A;   EYE SURGERY  08/2018   IR EXCHANGE BILIARY DRAIN  05/04/2020   KIDNEY STONE SURGERY     KNEE SURGERY     PACEMAKER INSERTION N/A 07/13/2018   Procedure: INSERTION PACEMAKER-DUEL CHAMBER INITIAL IMPLANT;  Surgeon: Isaias Cowman, MD;  Location: [facility] ORS;  Service: Cardiovascular;  Laterality: N/A;   SPINE SURGERY      There were no vitals filed for this visit.   Subjective Assessment - 08/13/21 1344     Subjective Patient reports being really weak since having COVID around 08/05/2021. His caregiver reports he has not been up walking much and not eating as much.    Patient is accompained by: Family member    Pertinent History Pt referred for deconditioning and gait instability. He has had a decline since he last finished therapy. Pt had a hospital admission from 05/02/20 to 05/14/20 for acute cholecystitis and is now s/p cholecystostomy drain placement. Drain is still in place and pt is waiting to hear when they plan to remove it. Pt discharged to SNF and then returned home. In addition pt underwent radiation therapy for a RUL lung mass/cancer and had his last radiation treatment in September of 2021.  He reports that he has continued to walk at home as his primary exercise. Pt was previously seen at this clinic for PT/OT due to weakness/imbalance s/p CVA. Prior history from 09/21/19: Pt is a 87 year old male who presents with imbalance and difficulty with gait following a right frontal CVA with left hemiparesis on 05/22/19.  He has completed inpatient and home health PT, and now presents for OP PT.  He was discharged from inpatient rehab at a supervision level of assistance, ambulating with a RW.  His family notes that he is impulsive and will try to ambulate without his RW at home.  Family is  currently providing 24/7 supervision.    Limitations House hold activities;Walking    How long can you sit comfortably? no limitations    How long can you walk comfortably? he requires a RW    Patient Stated Goals Pt wants to be able to walk his granddaughter down the aisle at her wedding November 2022    Currently in Pain? No/denies    Pain Onset More than a month ago             ** Treatment limited secondary to patient not feeling well due to weakness associated with recent bout of COVID.   Therapeutic Exercises:   Seated hip march - 2lb - 2sets of 10 reps Seated Hip add - squeeze red ball x 10 reps with 5 sec hold Seated Hip abd- 2lb- 2 sets of 10 reps Seated knee ext- 2lb - 2 sets of 10 reps- assist with Left LE at times. Seated heel raises- 2lb- 2 sets of 10 reps.  Education provided throughout session via VC/TC and demonstration to facilitate movement at target joints and correct muscle activation for all exercises performed.   Assessment of goals:  Deferred testing secondary to patient not feeling well (recovering from recent bout of COVID). Will test next 1-2 sessions.  Clinical Impression: Treatment limited today to patient not feeling well due to recent bout of COVID.  He presents today with increased LE weakness today and dragging his leg LE with gait while walking into clinic. Unable to assess goals and will reassess goals next 1-2 visits as he is feeling better. All remaining goals are still appropriate and patient is motivated to continue to progress. Patient's condition has the potential to improve in response to therapy. Maximum improvement is yet to be obtained. The anticipated improvement is attainable and reasonable in a generally predictable time.                            PT Education - 08/13/21 1345     Education Details Exercise technique    Person(s) Educated Patient    Methods Explanation;Demonstration;Tactile cues;Verbal cues     Comprehension Verbalized understanding;Returned demonstration;Verbal cues required;Tactile cues required;Need further instruction              PT Short Term Goals - 01/21/21 1438       PT SHORT TERM GOAL #1   Title Pt will be independent with HEP in order to improve strength and balance in order to decrease fall risk and improve function at home.    Baseline 01/02/2021- Patient verbalize understanding of walking and seated exercises for home program. 01/21/21 patient has been more active and understands HEP.    Time 6    Period Weeks    Status Achieved    Target Date 01/10/21  PT Long Term Goals - 08/14/21 1456       PT LONG TERM GOAL #1   Title Pt will improve BERG by at least 3 points in order to demonstrate clinically significant improvement in balance.    Baseline 09/20/20: 32/56 (Previous discharge: 03/06/20: 46/56), 12/14: 32/56, 1/13: 31/56, 01/16/21 35/56, 02/06/2021=36/56. 02/18/2021= 37/56.03/20/2021- Deferred secondary to left knee aching- will attempt next visit. 05/06/2021= 39/56; 05/13/2021= 36/56 *patient having more left knee pain. 07/16/2021= 40/56    Time 12    Period Weeks    Status Achieved      PT LONG TERM GOAL #2   Title Pt will decrease TUG to below 14 seconds/decrease in order to demonstrate decreased fall risk.    Baseline 09/20/20: 19.2s, 12/14: 17.48, 1/13: 16.65 sec. 01/02/2021=15.75 sec using 4WW, 01/21/21  14.37 seconds. 02/06/2021= 14.92 sec using 4WW. 02/18/2021= 14.5 sec using 4WW. 03/20/2021- Deferred secondary to left knee aching- will attempt next visit. 05/06/2021= 16.32 sec avg using 4WW. 05/13/2021= 16.5 sec with 4WW *increased left knee pain. 06/11/21: 20.11 seconds (increased time to turn) using 4WW;  08/13/2021- Deferred testing secondary to patient not feeling well (recovering from recent bout of COVID). Will test next 1-2 sessions.    Time 12    Period Weeks    Status On-going    Target Date 11/05/21      PT LONG TERM GOAL #3   Title Pt  will decrease 5TSTS by at least 3 seconds in order to demonstrate clinically significant improvement in LE strength.    Baseline 09/20/20: 23.1s with minA+1 for strength and balance (Previous discharge 03/06/20: 16.1s), 12/14: 20 sec, 1/13: 22 sec.   01/02/2021= 16.05 sec with light UE support. 01/21/21 01/21/21 37.90 seconds without UE, with UE support 12.93 seconds: 02/06/2021= with 1 UE 17.93 sec. 02/18/2021= 21.33 sec without UE support. 05/06/2021= 15.3 sec with BUE Support and 21. 0 sec without UE support. 05/13/2021=15.0 sec with BUE support; 16 sec without UE support.    Time 12    Period Weeks    Status Achieved      PT LONG TERM GOAL #4   Title Pt will increase his self-selected 10MWT to >0.74 m/s in order to demonstrate improvement in gait speed and improved community ambulation    Baseline 09/20/20: self-selected: 14.4s = 0.69 m/s with rollator (Previous discharge 03/06/20: self-selected: 13.6s = 0.74 m/s), 12/14: 0.75 m/s, 1/13: 0.78 m/s. 01/02/2021= 0.85 m/s using 4WW, 01/16/21 with 4 WW .99 m/s. 02/06/2021= self selected = 0.93 m/s with 1DQ    Time 4    Period Weeks    Status Achieved      PT LONG TERM GOAL #5   Title Patient will increase six minute walk test distance to >1000 for progression to community ambulator and improve gait ability    Baseline 01/10/20  620 ft with rollator, 01/21/21  965 ft; 02/06/2021= 910 feet with 4WW.  02/18/2021= 950 feet with 4WW. 05/06/2021= will test next visit. 05/13/2021- Deferred secondary to increased left knee pain. 06/11/21: 760ft with 2IW *knee pain throughout test. 07/11/2021= 900 feet with use of 4WW.  08/13/2021- Deferred testing secondary to patient not feeling well (recovering from recent bout of COVID). Will test next 1-2 sessions.    Time 12    Period Weeks    Status On-going    Target Date 11/05/21      PT LONG TERM GOAL #6   Title Patient will demonstrate ability to walk with HHA +1 > 200 ft.  without loss of balance on level surfaces to simulate walking  his grandaughter down the aisle for her upcoming wedding in November 2022.    Baseline 05/13/2021- Patient currently ambulating short community distance with use of 4WW but desires to walk with his grandaughter with HHA for her wedding. 06/11/21: attempted 5 steps with HHA x1, deemed unsafe. 07/16/2021- Patient ambulated around 100 feet with HHA (arms interlocked) with PT to simulate walking daughter down aisle.  08/13/2021- Deferred testing secondary to patient not feeling well (recovering from recent bout of COVID). Will test next 1-2 sessions.    Time 12    Period Weeks    Status On-going      PT LONG TERM GOAL #7   Title Pt will decrease 5TSTS  to 14 sec or less with BUE support on armrest in order to demonstrate clinically significant improvement in LE strength.    Baseline 05/13/2021=15.0 sec with BUE support; 26 sec without UE support. 06/11/21: 14.61 seconds with BUE support. 07/16/2021= 23 sec without UE support. 08/13/2021= 24. 2 sec with BUE support.    Time 12    Period Weeks    Status On-going    Target Date 08/14/21                   Plan - 08/13/21 1347     Personal Factors and Comorbidities Age;Comorbidity 3+    Comorbidities HTN, CVA, cholecystitis with cholecystostomy drain    Examination-Activity Limitations Bend;Lift;Squat;Locomotion Level;Stairs;Stand    Examination-Participation Restrictions Community Activity;Driving;Laundry;Meal Prep;Shop    Stability/Clinical Decision Making Unstable/Unpredictable    Rehab Potential Fair    PT Frequency 2x / week    PT Duration 12 weeks    PT Treatment/Interventions ADLs/Self Care Home Management;Cryotherapy;Electrical Stimulation;Iontophoresis 4mg /ml Dexamethasone;Moist Heat;Traction;Ultrasound;DME Instruction;Gait training;Stair training;Therapeutic activities;Therapeutic exercise;Functional mobility training;Balance training;Neuromuscular re-education;Patient/family education;Manual techniques;Dry needling;Vestibular;Joint  Manipulations;Spinal Manipulations;Aquatic Therapy    PT Next Visit Plan Continue to focus on LE strengthening and balance, dynamic balance tasks    PT Home Exercise Plan no changes    Consulted and Agree with Plan of Care Patient             Patient will benefit from skilled therapeutic intervention in order to improve the following deficits and impairments:  Abnormal gait, Decreased balance, Decreased mobility, Difficulty walking, Decreased knowledge of precautions, Decreased safety awareness, Decreased activity tolerance, Decreased strength, Decreased endurance  Visit Diagnosis: Abnormality of gait and mobility  Difficulty in walking, not elsewhere classified  Muscle weakness (generalized)  Other lack of coordination     Problem List Patient Active Problem List   Diagnosis Date Noted   Localized osteoarthritis of knees, bilateral 06/13/2021   Moderate protein-calorie malnutrition ([place]) 03/11/2021   Hemiparesis affecting left side as late effect of cerebrovascular accident (CVA) ([place]) 03/11/2021   Aneurysm, aorta, thoracic ([place]) 12/21/2020   Calculus of bile duct without cholecystitis and without obstruction    Non-small cell carcinoma of lung, right ([place]) 09/07/2020   Mass of upper lobe of right lung 07/13/2020   Calculus of bile duct with cholecystitis without obstruction    Abnormal findings on imaging of biliary tract    Acute cholecystitis 05/02/2020   Chronic venous insufficiency 12/05/2019   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    CKD (chronic kidney disease), stage II    New onset atrial fibrillation ([facility])    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   Goals of care, counseling/discussion    Palliative care by specialist  DNR (do not resuscitate) discussion    History of CVA (cerebrovascular accident) 05/22/2019   Persistent proteinuria 07/30/2018   Mobitz type 2 second degree heart block 07/13/2018   Chronic kidney disease, stage III  (moderate) ([place]) 06/15/2018   Leg pain 05/06/2018   Lymphedema 05/06/2018   Coagulopathy ([place]) 07/07/2017   Lumbar spondylosis 03/10/2017   Elevated uric acid in blood 11/20/2016   BPH (benign prostatic hyperplasia) 05/29/2016   Peripheral vascular disease of lower extremity ([place]) 03/03/2016   Gallstone 12/31/2015   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   Atherosclerosis of aorta ([place]) 11/22/2015   Carotid artery narrowing 11/22/2015   Diverticulosis of colon 11/22/2015   Decreased creatinine clearance 11/22/2015   Arthritis, degenerative 11/22/2015   Lactose intolerance 11/22/2015   Basal cell carcinoma 11/22/2015   Essential hypertension 06/21/2015   Hyperlipemia 06/21/2015   GERD (gastroesophageal reflux disease) 06/21/2015   Calculus of kidney 11/18/2013    Lewis Moccasin, PT 08/14/2021, 3:14 PM  [place] MAIN [facility] SERVICES [address] [place], [place], [address] Phone: [phone]   Fax:  [phone]  Name: Jose Castro MRN: [id number] Date of Birth: January 12, 1926

## 2021-08-14 NOTE — Telephone Encounter (Signed)
[name] called stating that patient was told he would get a PET in 3 months, but nothing has been scheduled. Please contact her regarding this matter  PLAN: At this time I have ordered a PET CT scan in 3 months with a follow-up with me shortly after.  Should these new nodules show any evidence of hypermetabolic activity may consider SBRT to them.  Patient's daughter is comprehending my treatment plan and recommendations well.  Appointments were made.  Patient and family know to call with any concerns.  I would like to take this opportunity to thank you for allowing me to participate in the care of your patient.Noreene Filbert, MD

## 2021-08-15 ENCOUNTER — Ambulatory Visit: Payer: Medicare Other

## 2021-08-15 ENCOUNTER — Other Ambulatory Visit: Payer: Self-pay

## 2021-08-15 DIAGNOSIS — R269 Unspecified abnormalities of gait and mobility: Secondary | ICD-10-CM

## 2021-08-15 DIAGNOSIS — R262 Difficulty in walking, not elsewhere classified: Secondary | ICD-10-CM

## 2021-08-15 DIAGNOSIS — M6281 Muscle weakness (generalized): Secondary | ICD-10-CM

## 2021-08-15 DIAGNOSIS — R278 Other lack of coordination: Secondary | ICD-10-CM

## 2021-08-15 NOTE — Therapy (Signed)
[place] MAIN [facility] SERVICES [address] [place], [place], [address] Phone: [phone]   Fax:  [phone]  Physical Therapy Treatment  Patient Details  Name: Jose Castro MRN: [id number] Date of Birth: [birth year] Referring Provider (PT): Dr. Ancil Boozer   Encounter Date: 08/15/2021   PT End of Session - 08/15/21 1419     Visit Number 25    Number of Visits 38    Date for PT Re-Evaluation 11/05/21    Authorization Type eval: 09/20/20, Progress note 01/02/2021, PN on 07/03/6628, Recert = 4/76/5465-0/35/4656; PN on 4/202022; PN on 07/01/2750; Recert= 7/00/17494-03/09/6758; PN on 06/11/21; PN on 1/63/8466; Recert on 5/99/3570- 17/05/9389    PT Start Time 1350    PT Stop Time 1424    PT Time Calculation (min) 34 min    Equipment Utilized During Treatment Gait belt    Activity Tolerance Patient tolerated treatment well    Behavior During Therapy WFL for tasks assessed/performed             Past Medical History:  Diagnosis Date   Allergy    Penicillin   Arthritis    Basal cell carcinoma    BPH (benign prostatic hyperplasia)    Chronic kidney disease    had a kidney stone which per family was a cyst that was removed   Dysrhythmia    GERD (gastroesophageal reflux disease)    History of kidney stones    Hyperlipidemia    Hypertension    Left-sided low back pain with left-sided sciatica    Lung mass    Splenic vein thrombosis    Stroke ([place]) 05/22/2019   Thrombosis 12/2015   mural  area and no notation of heart attack    Past Surgical History:  Procedure Laterality Date   APPENDECTOMY     CATARACT EXTRACTION, BILATERAL     CYSTOSCOPY WITH INSERTION OF UROLIFT     ENDOSCOPIC RETROGRADE CHOLANGIOPANCREATOGRAPHY (ERCP) WITH PROPOFOL N/A 05/11/2020   Procedure: ENDOSCOPIC RETROGRADE CHOLANGIOPANCREATOGRAPHY (ERCP) WITH PROPOFOL;  Surgeon: [name], MD;  Location: [facility] ENDOSCOPY;  Service: Endoscopy;  Laterality: N/A;   ERCP N/A 10/23/2020    Procedure: ENDOSCOPIC RETROGRADE CHOLANGIOPANCREATOGRAPHY (ERCP);  Surgeon: [name], MD;  Location: [facility] ENDOSCOPY;  Service: Endoscopy;  Laterality: N/A;   EYE SURGERY  08/2018   IR EXCHANGE BILIARY DRAIN  05/04/2020   KIDNEY STONE SURGERY     KNEE SURGERY     PACEMAKER INSERTION N/A 07/13/2018   Procedure: INSERTION PACEMAKER-DUEL CHAMBER INITIAL IMPLANT;  Surgeon: Isaias Cowman, MD;  Location: [facility] ORS;  Service: Cardiovascular;  Laterality: N/A;   SPINE SURGERY      There were no vitals filed for this visit.   Subjective Assessment - 08/15/21 1417     Subjective Patient and caregiver report ongoing weakness and that the patient is going back to primary MD tomorrow for follow up. Patient reports he almost cancelled but came to see if he could do some exercises.    Patient is accompained by: Family member    Pertinent History Pt referred for deconditioning and gait instability. He has had a decline since he last finished therapy. Pt had a hospital admission from 05/02/20 to 05/14/20 for acute cholecystitis and is now s/p cholecystostomy drain placement. Drain is still in place and pt is waiting to hear when they plan to remove it. Pt discharged to SNF and then returned home. In addition pt underwent radiation therapy for a RUL lung mass/cancer and had his last  radiation treatment in September of 2021. He reports that he has continued to walk at home as his primary exercise. Pt was previously seen at this clinic for PT/OT due to weakness/imbalance s/p CVA. Prior history from 09/21/19: Pt is a 87 year old male who presents with imbalance and difficulty with gait following a right frontal CVA with left hemiparesis on 05/22/19.  He has completed inpatient and home health PT, and now presents for OP PT.  He was discharged from inpatient rehab at a supervision level of assistance, ambulating with a RW.  His family notes that he is impulsive and will try to ambulate without his RW at home.  Family  is currently providing 24/7 supervision.    Limitations House hold activities;Walking    How long can you sit comfortably? no limitations    How long can you walk comfortably? he requires a RW    Patient Stated Goals Pt wants to be able to walk his granddaughter down the aisle at her wedding November 2022    Currently in Pain? No/denies    Pain Onset More than a month ago              Interventions:   Patient  limited secondary to feeling rough secondary to recent COVID infection. He has MD visit tomorrow for f/u due to ongoing low energy/weakness/Not eating well.   Therapeutic exercises:  Seated hip march - 2.5 lb - 2sets of 12 reps Seated Hip add - squeeze red ball x 12 reps with 5 sec hold Seated Hip abd- RTB - 2 sets of 12 reps Seated knee ext- 2.5 lb - 2 sets of 12 reps Seated heel raises- 2.5 lb- 2 sets of 12 reps.  Seated ham curl RTB BLE 2 sets of 12 reps.  Seated quad sets with 5 sec hold (propping leg onto stool) x 10 reps Education provided throughout session via VC/TC and demonstration to facilitate movement at target joints and correct muscle activation for all exercises performed.    Clinical Impression: Patient continues to be limited with participation due to ongoing weakness from COVID infection.  He was able to demonstrate progression including increased resistance and  increased repetitions  with short rest breaks. He remains very weak overall and has MD visit tomorrow for further evaluation. Pt will benefit from further skilled PT to improve BLE strength, balance and functional mobility                          PT Short Term Goals - 01/21/21 1438       PT SHORT TERM GOAL #1   Title Pt will be independent with HEP in order to improve strength and balance in order to decrease fall risk and improve function at home.    Baseline 01/02/2021- Patient verbalize understanding of walking and seated exercises for home program. 01/21/21 patient has  been more active and understands HEP.    Time 6    Period Weeks    Status Achieved    Target Date 01/10/21               PT Long Term Goals - 08/14/21 1456       PT LONG TERM GOAL #1   Title Pt will improve BERG by at least 3 points in order to demonstrate clinically significant improvement in balance.    Baseline 09/20/20: 32/56 (Previous discharge: 03/06/20: 46/56), 12/14: 32/56, 1/13: 31/56, 01/16/21 35/56, 02/06/2021=36/56. 02/18/2021= 37/56.03/20/2021- Deferred secondary to left  knee aching- will attempt next visit. 05/06/2021= 39/56; 05/13/2021= 36/56 *patient having more left knee pain. 07/16/2021= 40/56    Time 12    Period Weeks    Status Achieved      PT LONG TERM GOAL #2   Title Pt will decrease TUG to below 14 seconds/decrease in order to demonstrate decreased fall risk.    Baseline 09/20/20: 19.2s, 12/14: 17.48, 1/13: 16.65 sec. 01/02/2021=15.75 sec using 4WW, 01/21/21  14.37 seconds. 02/06/2021= 14.92 sec using 4WW. 02/18/2021= 14.5 sec using 4WW. 03/20/2021- Deferred secondary to left knee aching- will attempt next visit. 05/06/2021= 16.32 sec avg using 4WW. 05/13/2021= 16.5 sec with 4WW *increased left knee pain. 06/11/21: 20.11 seconds (increased time to turn) using 4WW;  08/13/2021- Deferred testing secondary to patient not feeling well (recovering from recent bout of COVID). Will test next 1-2 sessions.    Time 12    Period Weeks    Status On-going    Target Date 11/05/21      PT LONG TERM GOAL #3   Title Pt will decrease 5TSTS by at least 3 seconds in order to demonstrate clinically significant improvement in LE strength.    Baseline 09/20/20: 23.1s with minA+1 for strength and balance (Previous discharge 03/06/20: 16.1s), 12/14: 20 sec, 1/13: 22 sec.   01/02/2021= 16.05 sec with light UE support. 01/21/21 01/21/21 37.90 seconds without UE, with UE support 12.93 seconds: 02/06/2021= with 1 UE 17.93 sec. 02/18/2021= 21.33 sec without UE support. 05/06/2021= 15.3 sec with BUE Support and 21. 0  sec without UE support. 05/13/2021=15.0 sec with BUE support; 16 sec without UE support.    Time 12    Period Weeks    Status Achieved      PT LONG TERM GOAL #4   Title Pt will increase his self-selected 10MWT to >0.74 m/s in order to demonstrate improvement in gait speed and improved community ambulation    Baseline 09/20/20: self-selected: 14.4s = 0.69 m/s with rollator (Previous discharge 03/06/20: self-selected: 13.6s = 0.74 m/s), 12/14: 0.75 m/s, 1/13: 0.78 m/s. 01/02/2021= 0.85 m/s using 4WW, 01/16/21 with 4 WW .99 m/s. 02/06/2021= self selected = 0.93 m/s with 5YK    Time 4    Period Weeks    Status Achieved      PT LONG TERM GOAL #5   Title Patient will increase six minute walk test distance to >1000 for progression to community ambulator and improve gait ability    Baseline 01/10/20  620 ft with rollator, 01/21/21  965 ft; 02/06/2021= 910 feet with 4WW.  02/18/2021= 950 feet with 4WW. 05/06/2021= will test next visit. 05/13/2021- Deferred secondary to increased left knee pain. 06/11/21: 764ft with 9XI *knee pain throughout test. 07/11/2021= 900 feet with use of 4WW.  08/13/2021- Deferred testing secondary to patient not feeling well (recovering from recent bout of COVID). Will test next 1-2 sessions.    Time 12    Period Weeks    Status On-going    Target Date 11/05/21      PT LONG TERM GOAL #6   Title Patient will demonstrate ability to walk with HHA +1 > 200 ft. without loss of balance on level surfaces to simulate walking his grandaughter down the aisle for her upcoming wedding in November 2022.    Baseline 05/13/2021- Patient currently ambulating short community distance with use of 4WW but desires to walk with his grandaughter with HHA for her wedding. 06/11/21: attempted 5 steps with HHA x1, deemed unsafe. 07/16/2021- Patient ambulated around  100 feet with HHA (arms interlocked) with PT to simulate walking daughter down aisle.  08/13/2021- Deferred testing secondary to patient not feeling well  (recovering from recent bout of COVID). Will test next 1-2 sessions.    Time 12    Period Weeks    Status On-going      PT LONG TERM GOAL #7   Title Pt will decrease 5TSTS  to 14 sec or less with BUE support on armrest in order to demonstrate clinically significant improvement in LE strength.    Baseline 05/13/2021=15.0 sec with BUE support; 26 sec without UE support. 06/11/21: 14.61 seconds with BUE support. 07/16/2021= 23 sec without UE support. 08/13/2021= 24. 2 sec with BUE support.    Time 12    Period Weeks    Status On-going    Target Date 08/14/21                   Plan - 08/15/21 1420     Clinical Impression Statement Patient continues to be limited with participation due to ongoing weakness from COVID infection.  He was able to demonstrate progression including increased resistance and  increased repetitions  with short rest breaks. He remains very weak overall and has MD visit tomorrow for further evaluation. Pt will benefit from further skilled PT to improve BLE strength, balance and functional mobility    Personal Factors and Comorbidities Age;Comorbidity 3+    Comorbidities HTN, CVA, cholecystitis with cholecystostomy drain    Examination-Activity Limitations Bend;Lift;Squat;Locomotion Level;Stairs;Stand    Examination-Participation Restrictions Community Activity;Driving;Laundry;Meal Prep;Shop    Stability/Clinical Decision Making Unstable/Unpredictable    Rehab Potential Fair    PT Frequency 2x / week    PT Duration 12 weeks    PT Treatment/Interventions ADLs/Self Care Home Management;Cryotherapy;Electrical Stimulation;Iontophoresis 4mg /ml Dexamethasone;Moist Heat;Traction;Ultrasound;DME Instruction;Gait training;Stair training;Therapeutic activities;Therapeutic exercise;Functional mobility training;Balance training;Neuromuscular re-education;Patient/family education;Manual techniques;Dry needling;Vestibular;Joint Manipulations;Spinal Manipulations;Aquatic Therapy     PT Next Visit Plan Continue to focus on LE strengthening and balance, dynamic balance tasks    PT Home Exercise Plan no changes    Consulted and Agree with Plan of Care Patient             Patient will benefit from skilled therapeutic intervention in order to improve the following deficits and impairments:  Abnormal gait, Decreased balance, Decreased mobility, Difficulty walking, Decreased knowledge of precautions, Decreased safety awareness, Decreased activity tolerance, Decreased strength, Decreased endurance  Visit Diagnosis: Abnormality of gait and mobility  Difficulty in walking, not elsewhere classified  Muscle weakness (generalized)  Other lack of coordination     Problem List Patient Active Problem List   Diagnosis Date Noted   Localized osteoarthritis of knees, bilateral 06/13/2021   Moderate protein-calorie malnutrition ([place]) 03/11/2021   Hemiparesis affecting left side as late effect of cerebrovascular accident (CVA) ([place]) 03/11/2021   Aneurysm, aorta, thoracic ([place]) 12/21/2020   Calculus of bile duct without cholecystitis and without obstruction    Non-small cell carcinoma of lung, right ([place]) 09/07/2020   Mass of upper lobe of right lung 07/13/2020   Calculus of bile duct with cholecystitis without obstruction    Abnormal findings on imaging of biliary tract    Acute cholecystitis 05/02/2020   Chronic venous insufficiency 12/05/2019   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    CKD (chronic kidney disease), stage II    New onset atrial fibrillation (H[facility])    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   Goals of care, counseling/discussion    Palliative  care by specialist    DNR (do not resuscitate) discussion    History of CVA (cerebrovascular accident) 05/22/2019   Persistent proteinuria 07/30/2018   Mobitz type 2 second degree heart block 07/13/2018   Chronic kidney disease, stage III (moderate) ([place]) 06/15/2018   Leg pain 05/06/2018    Lymphedema 05/06/2018   Coagulopathy ([place]) 07/07/2017   Lumbar spondylosis 03/10/2017   Elevated uric acid in blood 11/20/2016   BPH (benign prostatic hyperplasia) 05/29/2016   Peripheral vascular disease of lower extremity ([place]) 03/03/2016   Gallstone 12/31/2015   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   Atherosclerosis of aorta ([place]) 11/22/2015   Carotid artery narrowing 11/22/2015   Diverticulosis of colon 11/22/2015   Decreased creatinine clearance 11/22/2015   Arthritis, degenerative 11/22/2015   Lactose intolerance 11/22/2015   Basal cell carcinoma 11/22/2015   Essential hypertension 06/21/2015   Hyperlipemia 06/21/2015   GERD (gastroesophageal reflux disease) 06/21/2015   Calculus of kidney 11/18/2013    Lewis Moccasin, PT 08/15/2021, 2:45 PM  [place] MAIN [facility] SERVICES [address] [place], [place], [address] Phone: [phone]   Fax:  [phone]  Name: TAO SATZ MRN: [id number] Date of Birth: [birth year]

## 2021-08-15 NOTE — Progress Notes (Signed)
Name: Jose Castro   MRN: [id number]    DOB: [birth year]   Date:08/16/2021       Progress Note  Subjective  Chief Complaint  Follow Up  HPI  COVID-19 follow up: he was diagnosed with home test on 08/04/2021 , had virtual visit on 08/05/2021, treated with Monupiravir 800 mg BID for 5 days. His fever subsided , however he continues to have a mild wet cough, fatigue, lack of appetite and has lost weight . Daughter came in with him, she states family is concerned about his fatigue. Discussed symptoms of COVID may last, to resume mucinex and monitor for return of fever - we can check a CXR, encourage frequent small meals  Patient Active Problem List   Diagnosis Date Noted   Localized osteoarthritis of knees, bilateral 06/13/2021   Moderate protein-calorie malnutrition ([place]) 03/11/2021   Hemiparesis affecting left side as late effect of cerebrovascular accident (CVA) ([place]) 03/11/2021   Aneurysm, aorta, thoracic ([place]) 12/21/2020   Calculus of bile duct without cholecystitis and without obstruction    Non-small cell carcinoma of lung, right ([place]) 09/07/2020   Mass of upper lobe of right lung 07/13/2020   Calculus of bile duct with cholecystitis without obstruction    Abnormal findings on imaging of biliary tract    Acute cholecystitis 05/02/2020   Chronic venous insufficiency 12/05/2019   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    CKD (chronic kidney disease), stage II    New onset atrial fibrillation ([place])    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   Goals of care, counseling/discussion    Palliative care by specialist    DNR (do not resuscitate) discussion    History of CVA (cerebrovascular accident) 05/22/2019   Persistent proteinuria 07/30/2018   Mobitz type 2 second degree heart block 07/13/2018   Chronic kidney disease, stage III (moderate) ([place]) 06/15/2018   Leg pain 05/06/2018   Lymphedema 05/06/2018   Coagulopathy ([place]) 07/07/2017   Lumbar spondylosis  03/10/2017   Elevated uric acid in blood 11/20/2016   BPH (benign prostatic hyperplasia) 05/29/2016   Peripheral vascular disease of lower extremity ([place]) 03/03/2016   Gallstone 12/31/2015   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   Atherosclerosis of aorta ([place]) 11/22/2015   Carotid artery narrowing 11/22/2015   Diverticulosis of colon 11/22/2015   Decreased creatinine clearance 11/22/2015   Arthritis, degenerative 11/22/2015   Lactose intolerance 11/22/2015   Basal cell carcinoma 11/22/2015   Essential hypertension 06/21/2015   Hyperlipemia 06/21/2015   GERD (gastroesophageal reflux disease) 06/21/2015   Calculus of kidney 11/18/2013    Past Surgical History:  Procedure Laterality Date   APPENDECTOMY     CATARACT EXTRACTION, BILATERAL     CYSTOSCOPY WITH INSERTION OF UROLIFT     ENDOSCOPIC RETROGRADE CHOLANGIOPANCREATOGRAPHY (ERCP) WITH PROPOFOL N/A 05/11/2020   Procedure: ENDOSCOPIC RETROGRADE CHOLANGIOPANCREATOGRAPHY (ERCP) WITH PROPOFOL;  Surgeon: [name], MD;  Location: [facility] ENDOSCOPY;  Service: Endoscopy;  Laterality: N/A;   ERCP N/A 10/23/2020   Procedure: ENDOSCOPIC RETROGRADE CHOLANGIOPANCREATOGRAPHY (ERCP);  Surgeon: [name], MD;  Location: [facility] ENDOSCOPY;  Service: Endoscopy;  Laterality: N/A;   EYE SURGERY  08/2018   IR EXCHANGE BILIARY DRAIN  05/04/2020   KIDNEY STONE SURGERY     KNEE SURGERY     PACEMAKER INSERTION N/A 07/13/2018   Procedure: INSERTION PACEMAKER-DUEL CHAMBER INITIAL IMPLANT;  Surgeon: Isaias Cowman, MD;  Location: [facility] ORS;  Service: Cardiovascular;  Laterality: N/A;   [place]  Family History  Problem Relation Age of Onset   Cancer Brother        bladder cancer with mets   Heart disease Mother    Aortic aneurysm Mother    Heart attack Maternal Aunt    Heart attack Maternal Uncle     Social History   Tobacco Use   Smoking status: Former    Packs/day: 0.00    Years: 16.00    Pack years:  0.00    Types: Cigarettes    Start date: 10/21/1944    Quit date: 10/21/1960    Years since quitting: 60.8   Smokeless tobacco: Never  Substance Use Topics   Alcohol use: Yes    Alcohol/week: 3.0 standard drinks    Types: 3 Standard drinks or equivalent per week    Comment: once a week when he goes out to dinner-socially     Current Outpatient Medications:    acetaminophen (TYLENOL) 325 MG tablet, Take 2 tablets (650 mg total) by mouth every 4 (four) hours as needed for mild pain (or temp > 37.5 C (99.5 F))., Disp:  , Rfl:    apixaban (ELIQUIS) 2.5 MG TABS tablet, Take 1 tablet (2.5 mg total) by mouth 2 (two) times daily., Disp: 60 tablet, Rfl: 0   calcium carbonate (TUMS - DOSED IN MG ELEMENTAL CALCIUM) 500 MG chewable tablet, Chew 1 tablet by mouth daily., Disp: , Rfl:    Cholecalciferol (VITAMIN D3) 50 MCG (2000 UT) TABS, Take 2,000 Units by mouth daily., Disp: 30 tablet, Rfl: 0   Ensure Max Protein (ENSURE MAX PROTEIN) LIQD, Take 330 mLs (11 oz total) by mouth 2 (two) times daily., Disp: , Rfl:    metoprolol succinate (TOPROL XL) 25 MG 24 hr tablet, Take 1 tablet (25 mg total) by mouth daily., Disp: 30 tablet, Rfl: 0   Multiple Vitamins-Minerals (ZINC PO), Take 500 mg by mouth 2 (two) times daily., Disp: , Rfl:    Omega-3 Fatty Acids (FISH OIL) 1000 MG CAPS, Take 1,000 mg by mouth daily. , Disp: , Rfl:    pantoprazole (PROTONIX) 40 MG tablet, TAKE ONE TABLET EVERY DAY, Disp: 90 tablet, Rfl: 3   rosuvastatin (CRESTOR) 40 MG tablet, Take 1 tablet (40 mg total) by mouth daily at 6 PM., Disp: 30 tablet, Rfl: 0   ursodiol (ACTIGALL) 300 MG capsule, Take 300 mg by mouth daily., Disp: , Rfl:    finasteride (PROSCAR) 5 MG tablet, TAKE 1 TABLET BY MOUTH DAILY (Patient not taking: Reported on 08/16/2021), Disp: 90 tablet, Rfl: 1  Allergies  Allergen Reactions   Penicillins Itching and Swelling    Pt reported arm rash and swelling with penicillin administration while he was in the [employer].   However, pt was able to tolerate IV Unasyn without any problem, so penicillin allergy unlikely to be real or serious.  05/14/2020.    I personally reviewed active problem list, medication list, allergies, family history, social history, health maintenance with the patient/caregiver today.   ROS   Ten systems reviewed and is negative except as mentioned in HPI   Objective  Vitals:   08/16/21 1055  BP: 122/60  Pulse: 88  Resp: 16  Temp: 98 F (36.7 C)  SpO2: 94%  Weight: 152 lb (68.9 kg)  Height: 5\' 10"  (1.778 m)    Body mass index is 21.81 kg/m.  Physical Exam  Constitutional: Patient appears malnourished No distress.  HEENT: head atraumatic, normocephalic, pupils equal and reactive to light,  neck supple Cardiovascular:  Normal rate, regular rhythm and normal heart sounds.  No murmur heard. No BLE edema. Pulmonary/Chest: Effort normal and breath sounds normal. No respiratory distress. Abdominal: Soft.  There is no tenderness. Muscular skeletal: walking slowly, uses a walker  Psychiatric: Patient quieter than usual. Judgment and thought content normal.   Recent Results (from the past 2160 hour(s))  I-STAT creatinine     Status: None   Collection Time: 06/21/21 11:14 AM  Result Value Ref Range   Creatinine, Ser 1.20 0.61 - 1.24 mg/dL  Comprehensive metabolic panel     Status: Abnormal   Collection Time: 06/25/21 10:17 AM  Result Value Ref Range   Sodium 137 135 - 145 mmol/L   Potassium 4.3 3.5 - 5.1 mmol/L   Chloride 103 98 - 111 mmol/L   CO2 27 22 - 32 mmol/L   Glucose, Bld 93 70 - 99 mg/dL    Comment: Glucose reference range applies only to samples taken after fasting for at least 8 hours.   BUN 29 (H) 8 - 23 mg/dL   Creatinine, Ser 1.25 (H) 0.61 - 1.24 mg/dL   Calcium 9.2 8.9 - 10.3 mg/dL   Total Protein 6.6 6.5 - 8.1 g/dL   Albumin 4.0 3.5 - 5.0 g/dL   AST 14 (L) 15 - 41 U/L   ALT 8 0 - 44 U/L   Alkaline Phosphatase 58 38 - 126 U/L   Total Bilirubin 1.0 0.3  - 1.2 mg/dL   GFR, Estimated 54 (L) >60 mL/min    Comment: (NOTE) Calculated using the CKD-EPI Creatinine Equation (2021)    Anion gap 7 5 - 15    Comment: Performed at [facility], [address] [place], [place] [address]  CBC with Differential     Status: Abnormal   Collection Time: 06/25/21 10:17 AM  Result Value Ref Range   WBC 9.1 4.0 - 10.5 K/uL   RBC 4.37 4.22 - 5.81 MIL/uL   Hemoglobin 13.2 13.0 - 17.0 g/dL   HCT 41.4 39.0 - 52.0 %   MCV 94.7 80.0 - 100.0 fL   MCH 30.2 26.0 - 34.0 pg   MCHC 31.9 30.0 - 36.0 g/dL   RDW 14.4 11.5 - 15.5 %   Platelets 240 150 - 400 K/uL   nRBC 0.0 0.0 - 0.2 %   Neutrophils Relative % 59 %   Neutro Abs 5.4 1.7 - 7.7 K/uL   Lymphocytes Relative 24 %   Lymphs Abs 2.1 0.7 - 4.0 K/uL   Monocytes Relative 13 %   Monocytes Absolute 1.1 (H) 0.1 - 1.0 K/uL   Eosinophils Relative 3 %   Eosinophils Absolute 0.3 0.0 - 0.5 K/uL   Basophils Relative 1 %   Basophils Absolute 0.1 0.0 - 0.1 K/uL   Immature Granulocytes 0 %   Abs Immature Granulocytes 0.03 0.00 - 0.07 K/uL    Comment: Performed at [facility], [address] [place], [place] [address]     PHQ2/9: Depression screen [facility] 2/9 08/16/2021 03/28/2021 03/11/2021 09/07/2020 06/26/2020  Decreased Interest 0 0 0 0 0  Down, Depressed, Hopeless 0 0 0 0 0  PHQ - 2 Score 0 0 0 0 0  Altered sleeping - - - - 0  Tired, decreased energy - - - - 0  Change in appetite - - - - 0  Feeling bad or failure about yourself  - - - - 0  Trouble concentrating - - - - 0  Moving slowly or fidgety/restless - - - -  0  Suicidal thoughts - - - - 0  PHQ-9 Score - - - - 0  Difficult doing work/chores - - - - -  Some recent data might be hidden    phq 9 is negative   Fall Risk: Fall Risk  08/16/2021 03/28/2021 03/11/2021 09/07/2020 06/26/2020  Falls in the past year? 1 0 0 0 0  Number falls in past yr: 0 0 0 0 0  Injury with Fall? 0 0 0 0 0  Risk for fall due to : Impaired balance/gait;Impaired  mobility Impaired balance/gait - - -  Follow up Falls prevention discussed Falls prevention discussed - - -      Functional Status Survey: Is the patient deaf or have difficulty hearing?: Yes Does the patient have difficulty seeing, even when wearing glasses/contacts?: No Does the patient have difficulty concentrating, remembering, or making decisions?: Yes Does the patient have difficulty walking or climbing stairs?: Yes Does the patient have difficulty dressing or bathing?: Yes Does the patient have difficulty doing errands alone such as visiting a doctor's office or shopping?: Yes    Assessment & Plan  1. Cough  Advised mucinex   2. Fatigue, unspecified type  - TSH Likely due to COVID  3. History of COVID-19  Still recovering, advised rest, fluids and food   4. Moderate protein-calorie malnutrition (HCC)  - COMPLETE METABOLIC PANEL WITH GFR - CBC with Differential/Platelet - Magnesium  5. Low serum vitamin B12  - Vitamin B12

## 2021-08-16 ENCOUNTER — Encounter: Payer: Self-pay | Admitting: Family Medicine

## 2021-08-16 ENCOUNTER — Ambulatory Visit: Payer: Medicare Other | Admitting: Family Medicine

## 2021-08-16 VITALS — BP 122/60 | HR 88 | Temp 98.0°F | Resp 16 | Ht 70.0 in | Wt 152.0 lb

## 2021-08-16 DIAGNOSIS — Z8616 Personal history of COVID-19: Secondary | ICD-10-CM | POA: Diagnosis not present

## 2021-08-16 DIAGNOSIS — R059 Cough, unspecified: Secondary | ICD-10-CM | POA: Diagnosis not present

## 2021-08-16 DIAGNOSIS — R5383 Other fatigue: Secondary | ICD-10-CM | POA: Diagnosis not present

## 2021-08-16 DIAGNOSIS — E440 Moderate protein-calorie malnutrition: Secondary | ICD-10-CM

## 2021-08-16 DIAGNOSIS — E538 Deficiency of other specified B group vitamins: Secondary | ICD-10-CM

## 2021-08-17 LAB — COMPLETE METABOLIC PANEL WITH GFR
AG Ratio: 1.4 (calc) (ref 1.0–2.5)
ALT: 11 U/L (ref 9–46)
AST: 15 U/L (ref 10–35)
Albumin: 3.4 g/dL — ABNORMAL LOW (ref 3.6–5.1)
Alkaline phosphatase (APISO): 59 U/L (ref 35–144)
BUN/Creatinine Ratio: 13 (calc) (ref 6–22)
BUN: 20 mg/dL (ref 7–25)
CO2: 29 mmol/L (ref 20–32)
Calcium: 8.5 mg/dL — ABNORMAL LOW (ref 8.6–10.3)
Chloride: 106 mmol/L (ref 98–110)
Creat: 1.55 mg/dL — ABNORMAL HIGH (ref 0.70–1.22)
Globulin: 2.4 g/dL (calc) (ref 1.9–3.7)
Glucose, Bld: 98 mg/dL (ref 65–99)
Potassium: 3.3 mmol/L — ABNORMAL LOW (ref 3.5–5.3)
Sodium: 143 mmol/L (ref 135–146)
Total Bilirubin: 1.1 mg/dL (ref 0.2–1.2)
Total Protein: 5.8 g/dL — ABNORMAL LOW (ref 6.1–8.1)
eGFR: 42 mL/min/{1.73_m2} — ABNORMAL LOW (ref >=60)

## 2021-08-17 LAB — CBC WITH DIFFERENTIAL/PLATELET
Absolute Monocytes: 756 cells/uL (ref 200–950)
Basophils Absolute: 54 cells/uL (ref 0–200)
Basophils Relative: 0.6 %
Eosinophils Absolute: 225 cells/uL (ref 15–500)
Eosinophils Relative: 2.5 %
HCT: 41.1 % (ref 38.5–50.0)
Hemoglobin: 13.2 g/dL (ref 13.2–17.1)
Lymphs Abs: 1503 cells/uL (ref 850–3900)
MCH: 29.7 pg (ref 27.0–33.0)
MCHC: 32.1 g/dL (ref 32.0–36.0)
MCV: 92.6 fL (ref 80.0–100.0)
MPV: 10.8 fL (ref 7.5–12.5)
Monocytes Relative: 8.4 %
Neutro Abs: 6462 cells/uL (ref 1500–7800)
Neutrophils Relative %: 71.8 %
Platelets: 329 10*3/uL (ref 140–400)
RBC: 4.44 10*6/uL (ref 4.20–5.80)
RDW: 13.1 % (ref 11.0–15.0)
Total Lymphocyte: 16.7 %
WBC: 9 10*3/uL (ref 3.8–10.8)

## 2021-08-17 LAB — VITAMIN B12: Vitamin B-12: 645 pg/mL (ref 200–1100)

## 2021-08-17 LAB — TSH: TSH: 1.78 mIU/L (ref 0.40–4.50)

## 2021-08-17 LAB — MAGNESIUM: Magnesium: 1.4 mg/dL — ABNORMAL LOW (ref 1.5–2.5)

## 2021-08-19 ENCOUNTER — Other Ambulatory Visit: Payer: Self-pay | Admitting: Family Medicine

## 2021-08-19 DIAGNOSIS — R790 Abnormal level of blood mineral: Secondary | ICD-10-CM

## 2021-08-19 DIAGNOSIS — E876 Hypokalemia: Secondary | ICD-10-CM

## 2021-08-19 MED ORDER — POTASSIUM CHLORIDE CRYS ER 20 MEQ PO TBCR: 20.0000 meq | EXTENDED_RELEASE_TABLET | Freq: Every day | ORAL | 0 refills | Status: DC

## 2021-08-19 MED ORDER — MAGNESIUM OXIDE 400 MG PO TABS: 400.0000 mg | ORAL_TABLET | Freq: Every day | ORAL | 0 refills | Status: DC

## 2021-08-20 ENCOUNTER — Ambulatory Visit: Payer: Medicare Other

## 2021-08-22 ENCOUNTER — Ambulatory Visit: Payer: Medicare Other

## 2021-08-27 ENCOUNTER — Ambulatory Visit: Payer: Medicare Other

## 2021-08-27 ENCOUNTER — Other Ambulatory Visit: Payer: Self-pay

## 2021-08-27 DIAGNOSIS — R269 Unspecified abnormalities of gait and mobility: Secondary | ICD-10-CM

## 2021-08-27 DIAGNOSIS — M6281 Muscle weakness (generalized): Secondary | ICD-10-CM

## 2021-08-27 DIAGNOSIS — R262 Difficulty in walking, not elsewhere classified: Secondary | ICD-10-CM

## 2021-08-27 DIAGNOSIS — R2681 Unsteadiness on feet: Secondary | ICD-10-CM

## 2021-08-28 NOTE — Therapy (Signed)
[place] [facility] [address] [place], [place], [address] Phone: [phone]   Fax:  [phone]  Physical Therapy Treatment  Patient Details  Name: Jose Castro MRN: [id number] Date of Birth: September 14, 1926 Referring Provider (PT): Dr. Ancil Boozer   Encounter Date: 08/27/2021   PT End of Session - 08/27/21 1421     Visit Number 24    Number of Visits 81    Date for PT Re-Evaluation 11/05/21    Authorization Type eval: 09/20/20, Progress note 01/02/2021, PN on 04/03/85, Recert = 7/61/9509-02/24/7123; PN on 4/202022; PN on 04/07/997; Recert= 3/38/25053-08/07/6733; PN on 06/11/21; PN on 1/93/7902; Recert on 03/09/7352- 29/08/2425    PT Start Time 1345    PT Stop Time 1425    PT Time Calculation (min) 40 min    Equipment Utilized During Treatment Gait belt    Activity Tolerance Patient tolerated treatment well    Behavior During Therapy WFL for tasks assessed/performed             Past Medical History:  Diagnosis Date   Allergy    Penicillin   Arthritis    Basal cell carcinoma    BPH (benign prostatic hyperplasia)    Chronic kidney disease    had a kidney stone which per family was a cyst that was removed   Dysrhythmia    GERD (gastroesophageal reflux disease)    History of kidney stones    Hyperlipidemia    Hypertension    Left-sided low back pain with left-sided sciatica    Lung mass    Splenic vein thrombosis    Stroke ([place]) 05/22/2019   Thrombosis 12/2015   mural  area and no notation of heart attack    Past Surgical History:  Procedure Laterality Date   APPENDECTOMY     CATARACT EXTRACTION, BILATERAL     CYSTOSCOPY WITH INSERTION OF UROLIFT     ENDOSCOPIC RETROGRADE CHOLANGIOPANCREATOGRAPHY (ERCP) WITH PROPOFOL N/A 05/11/2020   Procedure: ENDOSCOPIC RETROGRADE CHOLANGIOPANCREATOGRAPHY (ERCP) WITH PROPOFOL;  Surgeon: [name], MD;  Location: [facility] ENDOSCOPY;  Service: Endoscopy;  Laterality: N/A;   ERCP N/A 10/23/2020    Procedure: ENDOSCOPIC RETROGRADE CHOLANGIOPANCREATOGRAPHY (ERCP);  Surgeon: [name], MD;  Location: [facility] ENDOSCOPY;  Service: Endoscopy;  Laterality: N/A;   EYE SURGERY  08/2018   IR EXCHANGE BILIARY DRAIN  05/04/2020   KIDNEY STONE SURGERY     KNEE SURGERY     PACEMAKER INSERTION N/A 07/13/2018   Procedure: INSERTION PACEMAKER-DUEL CHAMBER INITIAL IMPLANT;  Surgeon: Isaias Cowman, MD;  Location: [facility] ORS;  Service: Cardiovascular;  Laterality: N/A;   SPINE SURGERY      There were no vitals filed for this visit.   Subjective Assessment - 08/27/21 1355     Subjective Patient reports feeling like I strained my hamstring and having more difficulty moving around.    Patient is accompained by: Family member    Pertinent History Pt referred for deconditioning and gait instability. He has had a decline since he last finished therapy. Pt had a hospital admission from 05/02/20 to 05/14/20 for acute cholecystitis and is now s/p cholecystostomy drain placement. Drain is still in place and pt is waiting to hear when they plan to remove it. Pt discharged to SNF and then returned home. In addition pt underwent radiation therapy for a RUL lung mass/cancer and had his last radiation treatment in September of 2021. He reports that he has continued to walk at home as his primary exercise. Pt  was previously seen at this clinic for PT/OT due to weakness/imbalance s/p CVA. Prior history from 09/21/19: Pt is a 87 year old male who presents with imbalance and difficulty with gait following a right frontal CVA with left hemiparesis on 05/22/19.  He has completed inpatient and home health PT, and now presents for OP PT.  He was discharged from inpatient rehab at a supervision level of assistance, ambulating with a RW.  His family notes that he is impulsive and will try to ambulate without his RW at home.  Family is currently providing 24/7 supervision.    Limitations House hold activities;Walking    How long can you  sit comfortably? no limitations    How long can you walk comfortably? he requires a RW    Patient Stated Goals Pt wants to be able to walk his granddaughter down the aisle at her wedding November 2022    Currently in Pain? Yes    Pain Score 6     Pain Location Leg    Pain Orientation Left;Posterior    Pain Descriptors / Indicators Sore;Tightness    Pain Type Acute pain    Pain Onset In the past 7 days    Pain Frequency Intermittent    Aggravating Factors  walking, moving my leg    Pain Relieving Factors Rest, Ice, head    Effect of Pain on Daily Activities Limited standing, Walking             INTERVENTIONS:   *Patient entered clinic today complaining of left posterior upper leg pain- Stating he doesn't know how but feel like he pulled his hamstring.   Manual therapy:  PROM (gentle) HS stetching and hip adductor stretching x 5 min Left LE. STM to medial hamstring region (tender to touch)  Massage rolling stick to posterior Leg PROM to left hip and knee in all planes.  Gentle AAROM to knee flex.   Ice pack to left posterior LE while sitting and performed some Scap retraction and shoulder ext with RTB x 15 reps. Then gentle ham curls sitting at EOM x 20 reps.   Walking in clinic - Patient limping more on left LE vs. Previous visits and taking shorter steps than normal.   Clinical Impression: Treatment limited secondary to patient complaining of left hamstring region pain. He was able to respond well to manual techniques and reported some relief down to 3/10. Pt will benefit from further skilled PT to improve BLE strength, balance and functional mobility                           PT Education - 08/27/21 1420     Education Details Ed. on hamstring pain and pain management    Person(s) Educated Patient    Methods Explanation;Demonstration;Tactile cues;Verbal cues    Comprehension Verbalized understanding;Returned demonstration;Verbal cues  required;Tactile cues required;Need further instruction              PT Short Term Goals - 01/21/21 1438       PT SHORT TERM GOAL #1   Title Pt will be independent with HEP in order to improve strength and balance in order to decrease fall risk and improve function at home.    Baseline 01/02/2021- Patient verbalize understanding of walking and seated exercises for home program. 01/21/21 patient has been more active and understands HEP.    Time 6    Period Weeks    Status Achieved    Target Date  01/10/21               PT Long Term Goals - 08/14/21 1456       PT LONG TERM GOAL #1   Title Pt will improve BERG by at least 3 points in order to demonstrate clinically significant improvement in balance.    Baseline 09/20/20: 32/56 (Previous discharge: 03/06/20: 46/56), 12/14: 32/56, 1/13: 31/56, 01/16/21 35/56, 02/06/2021=36/56. 02/18/2021= 37/56.03/20/2021- Deferred secondary to left knee aching- will attempt next visit. 05/06/2021= 39/56; 05/13/2021= 36/56 *patient having more left knee pain. 07/16/2021= 40/56    Time 12    Period Weeks    Status Achieved      PT LONG TERM GOAL #2   Title Pt will decrease TUG to below 14 seconds/decrease in order to demonstrate decreased fall risk.    Baseline 09/20/20: 19.2s, 12/14: 17.48, 1/13: 16.65 sec. 01/02/2021=15.75 sec using 4WW, 01/21/21  14.37 seconds. 02/06/2021= 14.92 sec using 4WW. 02/18/2021= 14.5 sec using 4WW. 03/20/2021- Deferred secondary to left knee aching- will attempt next visit. 05/06/2021= 16.32 sec avg using 4WW. 05/13/2021= 16.5 sec with 4WW *increased left knee pain. 06/11/21: 20.11 seconds (increased time to turn) using 4WW;  08/13/2021- Deferred testing secondary to patient not feeling well (recovering from recent bout of COVID). Will test next 1-2 sessions.    Time 12    Period Weeks    Status On-going    Target Date 11/05/21      PT LONG TERM GOAL #3   Title Pt will decrease 5TSTS by at least 3 seconds in order to demonstrate  clinically significant improvement in LE strength.    Baseline 09/20/20: 23.1s with minA+1 for strength and balance (Previous discharge 03/06/20: 16.1s), 12/14: 20 sec, 1/13: 22 sec.   01/02/2021= 16.05 sec with light UE support. 01/21/21 01/21/21 37.90 seconds without UE, with UE support 12.93 seconds: 02/06/2021= with 1 UE 17.93 sec. 02/18/2021= 21.33 sec without UE support. 05/06/2021= 15.3 sec with BUE Support and 21. 0 sec without UE support. 05/13/2021=15.0 sec with BUE support; 16 sec without UE support.    Time 12    Period Weeks    Status Achieved      PT LONG TERM GOAL #4   Title Pt will increase his self-selected 10MWT to >0.74 m/s in order to demonstrate improvement in gait speed and improved community ambulation    Baseline 09/20/20: self-selected: 14.4s = 0.69 m/s with rollator (Previous discharge 03/06/20: self-selected: 13.6s = 0.74 m/s), 12/14: 0.75 m/s, 1/13: 0.78 m/s. 01/02/2021= 0.85 m/s using 4WW, 01/16/21 with 4 WW .99 m/s. 02/06/2021= self selected = 0.93 m/s with 8UX    Time 4    Period Weeks    Status Achieved      PT LONG TERM GOAL #5   Title Patient will increase six minute walk test distance to >1000 for progression to community ambulator and improve gait ability    Baseline 01/10/20  620 ft with rollator, 01/21/21  965 ft; 02/06/2021= 910 feet with 4WW.  02/18/2021= 950 feet with 4WW. 05/06/2021= will test next visit. 05/13/2021- Deferred secondary to increased left knee pain. 06/11/21: 752ft with 3KG *knee pain throughout test. 07/11/2021= 900 feet with use of 4WW.  08/13/2021- Deferred testing secondary to patient not feeling well (recovering from recent bout of COVID). Will test next 1-2 sessions.    Time 12    Period Weeks    Status On-going    Target Date 11/05/21      PT LONG TERM GOAL #6  Title Patient will demonstrate ability to walk with HHA +1 > 200 ft. without loss of balance on level surfaces to simulate walking his grandaughter down the aisle for her upcoming wedding in November  2022.    Baseline 05/13/2021- Patient currently ambulating short community distance with use of 4WW but desires to walk with his grandaughter with HHA for her wedding. 06/11/21: attempted 5 steps with HHA x1, deemed unsafe. 07/16/2021- Patient ambulated around 100 feet with HHA (arms interlocked) with PT to simulate walking daughter down aisle.  08/13/2021- Deferred testing secondary to patient not feeling well (recovering from recent bout of COVID). Will test next 1-2 sessions.    Time 12    Period Weeks    Status On-going      PT LONG TERM GOAL #7   Title Pt will decrease 5TSTS  to 14 sec or less with BUE support on armrest in order to demonstrate clinically significant improvement in LE strength.    Baseline 05/13/2021=15.0 sec with BUE support; 26 sec without UE support. 06/11/21: 14.61 seconds with BUE support. 07/16/2021= 23 sec without UE support. 08/13/2021= 24. 2 sec with BUE support.    Time 12    Period Weeks    Status On-going    Target Date 08/14/21                   Plan - 08/27/21 1713     Clinical Impression Statement Treatment limited secondary to patient complaining of left hamstring region pain. He was able to respond well to manual techniques and reported some relief down to 3/10. Pt will benefit from further skilled PT to improve BLE strength, balance and functional mobility    Personal Factors and Comorbidities Age;Comorbidity 3+    Comorbidities HTN, CVA, cholecystitis with cholecystostomy drain    Examination-Activity Limitations Bend;Lift;Squat;Locomotion Level;Stairs;Stand    Examination-Participation Restrictions Community Activity;Driving;Laundry;Meal Prep;Shop    Stability/Clinical Decision Making Unstable/Unpredictable    Rehab Potential Fair    PT Frequency 2x / week    PT Duration 12 weeks    PT Treatment/Interventions ADLs/Self Care Home Management;Cryotherapy;Electrical Stimulation;Iontophoresis 4mg /ml Dexamethasone;Moist Heat;Traction;Ultrasound;DME  Instruction;Gait training;Stair training;Therapeutic activities;Therapeutic exercise;Functional mobility training;Balance training;Neuromuscular re-education;Patient/family education;Manual techniques;Dry needling;Vestibular;Joint Manipulations;Spinal Manipulations;Aquatic Therapy    PT Next Visit Plan Continue to focus on LE strengthening and balance, dynamic balance tasks    PT Home Exercise Plan no changes    Consulted and Agree with Plan of Care Patient             Patient will benefit from skilled therapeutic intervention in order to improve the following deficits and impairments:  Abnormal gait, Decreased balance, Decreased mobility, Difficulty walking, Decreased knowledge of precautions, Decreased safety awareness, Decreased activity tolerance, Decreased strength, Decreased endurance  Visit Diagnosis: Abnormality of gait and mobility  Difficulty in walking, not elsewhere classified  Muscle weakness (generalized)  Unsteadiness on feet     Problem List Patient Active Problem List   Diagnosis Date Noted   Localized osteoarthritis of knees, bilateral 06/13/2021   Moderate protein-calorie malnutrition ([place]) 03/11/2021   Hemiparesis affecting left side as late effect of cerebrovascular accident (CVA) ([place]) 03/11/2021   Aneurysm, aorta, thoracic ([place]) 12/21/2020   Calculus of bile duct without cholecystitis and without obstruction    Non-small cell carcinoma of lung, right ([place]) 09/07/2020   Mass of upper lobe of right lung 07/13/2020   Calculus of bile duct with cholecystitis without obstruction    Abnormal findings on imaging of biliary tract    Acute  cholecystitis 05/02/2020   Chronic venous insufficiency 12/05/2019   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    CKD (chronic kidney disease), stage II    New onset atrial fibrillation ([facility])    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   Goals of care, counseling/discussion    Palliative care by specialist     DNR (do not resuscitate) discussion    History of CVA (cerebrovascular accident) 05/22/2019   Persistent proteinuria 07/30/2018   Mobitz type 2 second degree heart block 07/13/2018   Chronic kidney disease, stage III (moderate) ([place]) 06/15/2018   Leg pain 05/06/2018   Lymphedema 05/06/2018   Coagulopathy ([place]) 07/07/2017   Lumbar spondylosis 03/10/2017   Elevated uric acid in blood 11/20/2016   BPH (benign prostatic hyperplasia) 05/29/2016   Peripheral vascular disease of lower extremity ([place]) 03/03/2016   Gallstone 12/31/2015   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   Atherosclerosis of aorta ([place]) 11/22/2015   Carotid artery narrowing 11/22/2015   Diverticulosis of colon 11/22/2015   Decreased creatinine clearance 11/22/2015   Arthritis, degenerative 11/22/2015   Lactose intolerance 11/22/2015   Basal cell carcinoma 11/22/2015   Essential hypertension 06/21/2015   Hyperlipemia 06/21/2015   GERD (gastroesophageal reflux disease) 06/21/2015   Calculus of kidney 11/18/2013    Lewis Moccasin, PT 08/28/2021, 5:21 PM  [facility] [address] [place], [place], [address] Phone: [phone]   Fax:  [phone]  Name: Jose Castro MRN: [id number] Date of Birth: March 03, 1926

## 2021-08-29 ENCOUNTER — Ambulatory Visit: Payer: Medicare Other

## 2021-08-29 ENCOUNTER — Other Ambulatory Visit: Payer: Self-pay

## 2021-08-29 DIAGNOSIS — R269 Unspecified abnormalities of gait and mobility: Secondary | ICD-10-CM | POA: Diagnosis not present

## 2021-08-29 DIAGNOSIS — M6281 Muscle weakness (generalized): Secondary | ICD-10-CM

## 2021-08-29 DIAGNOSIS — R2681 Unsteadiness on feet: Secondary | ICD-10-CM

## 2021-08-29 DIAGNOSIS — R262 Difficulty in walking, not elsewhere classified: Secondary | ICD-10-CM

## 2021-08-29 NOTE — Therapy (Signed)
[place] MAIN [facility] SERVICES [address] [place], [place], [address] Phone: [phone]   Fax:  [phone]  Physical Therapy Treatment  Patient Details  Name: Jose Castro MRN: [id number] Date of Birth: [birth year] Referring Provider (PT): Dr. Ancil Boozer   Encounter Date: 08/29/2021   PT End of Session - 08/29/21 1402     Visit Number 2    Number of Visits 67    Date for PT Re-Evaluation 11/05/21    Authorization Type eval: 09/20/20, Progress note 01/02/2021, PN on 06/05/1606, Recert = 3/71/0626-9/48/5462; PN on 4/202022; PN on 7/0/3500; Recert= 9/38/18299-02/05/1695; PN on 06/11/21; PN on 7/89/3810; Recert on 1/75/1025- 85/01/7781    PT Start Time 1348    PT Stop Time 1427    PT Time Calculation (min) 39 min    Equipment Utilized During Treatment Gait belt    Activity Tolerance Patient tolerated treatment well    Behavior During Therapy WFL for tasks assessed/performed             Past Medical History:  Diagnosis Date   Allergy    Penicillin   Arthritis    Basal cell carcinoma    BPH (benign prostatic hyperplasia)    Chronic kidney disease    had a kidney stone which per family was a cyst that was removed   Dysrhythmia    GERD (gastroesophageal reflux disease)    History of kidney stones    Hyperlipidemia    Hypertension    Left-sided low back pain with left-sided sciatica    Lung mass    Splenic vein thrombosis    Stroke ([place]) 05/22/2019   Thrombosis 12/2015   mural  area and no notation of heart attack    Past Surgical History:  Procedure Laterality Date   APPENDECTOMY     CATARACT EXTRACTION, BILATERAL     CYSTOSCOPY WITH INSERTION OF UROLIFT     ENDOSCOPIC RETROGRADE CHOLANGIOPANCREATOGRAPHY (ERCP) WITH PROPOFOL N/A 05/11/2020   Procedure: ENDOSCOPIC RETROGRADE CHOLANGIOPANCREATOGRAPHY (ERCP) WITH PROPOFOL;  Surgeon: [name], MD;  Location: [facility] ENDOSCOPY;  Service: Endoscopy;  Laterality: N/A;   ERCP N/A 10/23/2020    Procedure: ENDOSCOPIC RETROGRADE CHOLANGIOPANCREATOGRAPHY (ERCP);  Surgeon: [name], MD;  Location: [facility] ENDOSCOPY;  Service: Endoscopy;  Laterality: N/A;   EYE SURGERY  08/2018   IR EXCHANGE BILIARY DRAIN  05/04/2020   KIDNEY STONE SURGERY     KNEE SURGERY     PACEMAKER INSERTION N/A 07/13/2018   Procedure: INSERTION PACEMAKER-DUEL CHAMBER INITIAL IMPLANT;  Surgeon: Isaias Cowman, MD;  Location: [facility] ORS;  Service: Cardiovascular;  Laterality: N/A;   SPINE SURGERY      There were no vitals filed for this visit.   Subjective Assessment - 08/29/21 1356     Subjective Patient reports feeling slightly better but continues to report some left hamstring tightness.    Patient is accompained by: Family member    Pertinent History Pt referred for deconditioning and gait instability. He has had a decline since he last finished therapy. Pt had a hospital admission from 05/02/20 to 05/14/20 for acute cholecystitis and is now s/p cholecystostomy drain placement. Drain is still in place and pt is waiting to hear when they plan to remove it. Pt discharged to SNF and then returned home. In addition pt underwent radiation therapy for a RUL lung mass/cancer and had his last radiation treatment in September of 2021. He reports that he has continued to walk at home as his primary exercise. Pt was  previously seen at this clinic for PT/OT due to weakness/imbalance s/p CVA. Prior history from 09/21/19: Pt is a 87 year old male who presents with imbalance and difficulty with gait following a right frontal CVA with left hemiparesis on 05/22/19.  He has completed inpatient and home health PT, and now presents for OP PT.  He was discharged from inpatient rehab at a supervision level of assistance, ambulating with a RW.  His family notes that he is impulsive and will try to ambulate without his RW at home.  Family is currently providing 24/7 supervision.    Limitations House hold activities;Walking    How long can you  sit comfortably? no limitations    How long can you walk comfortably? he requires a RW    Patient Stated Goals Pt wants to be able to walk his granddaughter down the aisle at her wedding November 2022    Pain Onset In the past 7 days    Pain Frequency Intermittent    Aggravating Factors  transfers, prolonged sitting/standing and walking    Pain Relieving Factors Rest, Ice, Heat    Effect of Pain on Daily Activities Limited standing, walking              Interventions:  Therapeutic Exercises:  Instructed patient in seated hamstring (gentle stretch) x 20 sec hold x 3 sets each leg  Seated ham curls (gentle) - no pain B LE x 12 reps.   Seated knee ext with 4lb. Ankle weights B LE x 12 reps. VC to slow down and try to fully extend knees- Difficulty with TKE due to B hamstring tightness and B quad weakness.   At Balance station:  Standing Hip march with 4lb AW BLE x 12 reps Standing hip ABD with 4lb. AW BLE x 12 reps Sit to stand with BUE support x 10 reps Standing hip ext BLE 4 lb.  (VC and visual demo to keep knee straight and trunk erect) x 12 reps each. Standing ham curl BLE 4lb. AW x 12 reps each (no reported pain) yet patient did experience left knee buckling.  Standing calf raises 4lb. BLE x 12 reps (patient reports as easy today) .   Standing wall Posture stretch x 60 sec x 2 sets.  Standing at wall with PVC pipe- Overhead shoulder flex to focus on posture x 12 reps. Patient denied pain but unable to press pipe to wall.   Clinical Impression: Patient presents with improving pain since last session in left Hamstring and able to participate in more LE strengthening without complaint of pain. He is still recovering from COVID overall but required only minimal rest breaks today. He was well motivated and able to perform all stretching and strengthening with improved standing endurance and no pain today.  Pt will benefit from further skilled PT to improve BLE strength, balance and  functional mobility                           PT Education - 08/29/21 1402     Education Details Exercsise technique. Pain relief ed for left hamstring    Person(s) Educated Patient    Methods Explanation;Demonstration;Tactile cues;Verbal cues    Comprehension Verbalized understanding;Returned demonstration;Verbal cues required;Need further instruction;Tactile cues required              PT Short Term Goals - 01/21/21 1438       PT SHORT TERM GOAL #1   Title Pt will be independent  with HEP in order to improve strength and balance in order to decrease fall risk and improve function at home.    Baseline 01/02/2021- Patient verbalize understanding of walking and seated exercises for home program. 01/21/21 patient has been more active and understands HEP.    Time 6    Period Weeks    Status Achieved    Target Date 01/10/21               PT Long Term Goals - 08/14/21 1456       PT LONG TERM GOAL #1   Title Pt will improve BERG by at least 3 points in order to demonstrate clinically significant improvement in balance.    Baseline 09/20/20: 32/56 (Previous discharge: 03/06/20: 46/56), 12/14: 32/56, 1/13: 31/56, 01/16/21 35/56, 02/06/2021=36/56. 02/18/2021= 37/56.03/20/2021- Deferred secondary to left knee aching- will attempt next visit. 05/06/2021= 39/56; 05/13/2021= 36/56 *patient having more left knee pain. 07/16/2021= 40/56    Time 12    Period Weeks    Status Achieved      PT LONG TERM GOAL #2   Title Pt will decrease TUG to below 14 seconds/decrease in order to demonstrate decreased fall risk.    Baseline 09/20/20: 19.2s, 12/14: 17.48, 1/13: 16.65 sec. 01/02/2021=15.75 sec using 4WW, 01/21/21  14.37 seconds. 02/06/2021= 14.92 sec using 4WW. 02/18/2021= 14.5 sec using 4WW. 03/20/2021- Deferred secondary to left knee aching- will attempt next visit. 05/06/2021= 16.32 sec avg using 4WW. 05/13/2021= 16.5 sec with 4WW *increased left knee pain. 06/11/21: 20.11 seconds  (increased time to turn) using 4WW;  08/13/2021- Deferred testing secondary to patient not feeling well (recovering from recent bout of COVID). Will test next 1-2 sessions.    Time 12    Period Weeks    Status On-going    Target Date 11/05/21      PT LONG TERM GOAL #3   Title Pt will decrease 5TSTS by at least 3 seconds in order to demonstrate clinically significant improvement in LE strength.    Baseline 09/20/20: 23.1s with minA+1 for strength and balance (Previous discharge 03/06/20: 16.1s), 12/14: 20 sec, 1/13: 22 sec.   01/02/2021= 16.05 sec with light UE support. 01/21/21 01/21/21 37.90 seconds without UE, with UE support 12.93 seconds: 02/06/2021= with 1 UE 17.93 sec. 02/18/2021= 21.33 sec without UE support. 05/06/2021= 15.3 sec with BUE Support and 21. 0 sec without UE support. 05/13/2021=15.0 sec with BUE support; 16 sec without UE support.    Time 12    Period Weeks    Status Achieved      PT LONG TERM GOAL #4   Title Pt will increase his self-selected 10MWT to >0.74 m/s in order to demonstrate improvement in gait speed and improved community ambulation    Baseline 09/20/20: self-selected: 14.4s = 0.69 m/s with rollator (Previous discharge 03/06/20: self-selected: 13.6s = 0.74 m/s), 12/14: 0.75 m/s, 1/13: 0.78 m/s. 01/02/2021= 0.85 m/s using 4WW, 01/16/21 with 4 WW .99 m/s. 02/06/2021= self selected = 0.93 m/s with 8EU    Time 4    Period Weeks    Status Achieved      PT LONG TERM GOAL #5   Title Patient will increase six minute walk test distance to >1000 for progression to community ambulator and improve gait ability    Baseline 01/10/20  620 ft with rollator, 01/21/21  965 ft; 02/06/2021= 910 feet with 4WW.  02/18/2021= 950 feet with 4WW. 05/06/2021= will test next visit. 05/13/2021- Deferred secondary to increased left knee pain. 06/11/21: 743ft  with 6CL *knee pain throughout test. 07/11/2021= 900 feet with use of 4WW.  08/13/2021- Deferred testing secondary to patient not feeling well (recovering from  recent bout of COVID). Will test next 1-2 sessions.    Time 12    Period Weeks    Status On-going    Target Date 11/05/21      PT LONG TERM GOAL #6   Title Patient will demonstrate ability to walk with HHA +1 > 200 ft. without loss of balance on level surfaces to simulate walking his grandaughter down the aisle for her upcoming wedding in November 2022.    Baseline 05/13/2021- Patient currently ambulating short community distance with use of 4WW but desires to walk with his grandaughter with HHA for her wedding. 06/11/21: attempted 5 steps with HHA x1, deemed unsafe. 07/16/2021- Patient ambulated around 100 feet with HHA (arms interlocked) with PT to simulate walking daughter down aisle.  08/13/2021- Deferred testing secondary to patient not feeling well (recovering from recent bout of COVID). Will test next 1-2 sessions.    Time 12    Period Weeks    Status On-going      PT LONG TERM GOAL #7   Title Pt will decrease 5TSTS  to 14 sec or less with BUE support on armrest in order to demonstrate clinically significant improvement in LE strength.    Baseline 05/13/2021=15.0 sec with BUE support; 26 sec without UE support. 06/11/21: 14.61 seconds with BUE support. 07/16/2021= 23 sec without UE support. 08/13/2021= 24. 2 sec with BUE support.    Time 12    Period Weeks    Status On-going    Target Date 08/14/21                   Plan - 08/29/21 1403     Clinical Impression Statement Patient presents with improving pain since last session in left Hamstring and able to participate in more LE strengthening without complaint of pain. He is still recovering from COVID overall but required only minimal rest breaks today. He was well motivated and able to perform all stretching and strengthening with improved standing endurance and no pain today.   Pt will benefit from further skilled PT to improve BLE strength, balance and functional mobility.    Personal Factors and Comorbidities Age;Comorbidity 3+     Comorbidities HTN, CVA, cholecystitis with cholecystostomy drain    Examination-Activity Limitations Bend;Lift;Squat;Locomotion Level;Stairs;Stand    Examination-Participation Restrictions Community Activity;Driving;Laundry;Meal Prep;Shop    Stability/Clinical Decision Making Unstable/Unpredictable    Rehab Potential Fair    PT Frequency 2x / week    PT Duration 12 weeks    PT Treatment/Interventions ADLs/Self Care Home Management;Cryotherapy;Electrical Stimulation;Iontophoresis 4mg /ml Dexamethasone;Moist Heat;Traction;Ultrasound;DME Instruction;Gait training;Stair training;Therapeutic activities;Therapeutic exercise;Functional mobility training;Balance training;Neuromuscular re-education;Patient/family education;Manual techniques;Dry needling;Vestibular;Joint Manipulations;Spinal Manipulations;Aquatic Therapy    PT Next Visit Plan Continue to focus on LE strengthening and balance, dynamic balance tasks    PT Home Exercise Plan no changes    Consulted and Agree with Plan of Care Patient             Patient will benefit from skilled therapeutic intervention in order to improve the following deficits and impairments:  Abnormal gait, Decreased balance, Decreased mobility, Difficulty walking, Decreased knowledge of precautions, Decreased safety awareness, Decreased activity tolerance, Decreased strength, Decreased endurance  Visit Diagnosis: Abnormality of gait and mobility  Difficulty in walking, not elsewhere classified  Muscle weakness (generalized)  Unsteadiness on feet     Problem List Patient Active Problem  List   Diagnosis Date Noted   Localized osteoarthritis of knees, bilateral 06/13/2021   Moderate protein-calorie malnutrition ([place]) 03/11/2021   Hemiparesis affecting left side as late effect of cerebrovascular accident (CVA) ([place]) 03/11/2021   Aneurysm, aorta, thoracic ([place]) 12/21/2020   Calculus of bile duct without cholecystitis and without obstruction    Non-small  cell carcinoma of lung, right ([place]) 09/07/2020   Mass of upper lobe of right lung 07/13/2020   Calculus of bile duct with cholecystitis without obstruction    Abnormal findings on imaging of biliary tract    Acute cholecystitis 05/02/2020   Chronic venous insufficiency 12/05/2019   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    CKD (chronic kidney disease), stage II    New onset atrial fibrillation ([place])    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   Goals of care, counseling/discussion    Palliative care by specialist    DNR (do not resuscitate) discussion    History of CVA (cerebrovascular accident) 05/22/2019   Persistent proteinuria 07/30/2018   M[place]itz type 2 second degree heart block 07/13/2018   Chronic kidney disease, stage III (moderate) ([place]) 06/15/2018   Leg pain 05/06/2018   Lymphedema 05/06/2018   Coagulopathy ([place]) 07/07/2017   Lumbar spondylosis 03/10/2017   Elevated uric acid in blood 11/20/2016   BPH (benign prostatic hyperplasia) 05/29/2016   Peripheral vascular disease of lower extremity ([place]) 03/03/2016   Gallstone 12/31/2015   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   Atherosclerosis of aorta ([place]) 11/22/2015   Carotid artery narrowing 11/22/2015   Diverticulosis of colon 11/22/2015   Decreased creatinine clearance 11/22/2015   Arthritis, degenerative 11/22/2015   Lactose intolerance 11/22/2015   Basal cell carcinoma 11/22/2015   Essential hypertension 06/21/2015   Hyperlipemia 06/21/2015   GERD (gastroesophageal reflux disease) 06/21/2015   Calculus of kidney 11/18/2013    Lewis Moccasin, PT 08/29/2021, 2:51 PM  [place] MAIN [facility] SERVICES [address] [place], [place], [address] Phone: [phone]   Fax:  [phone]  Name: Jose Castro MRN: [id number] Date of Birth: 08-Sep-1926

## 2021-09-03 ENCOUNTER — Ambulatory Visit: Payer: Medicare Other | Attending: Family Medicine

## 2021-09-03 ENCOUNTER — Other Ambulatory Visit: Payer: Self-pay

## 2021-09-03 DIAGNOSIS — R278 Other lack of coordination: Secondary | ICD-10-CM | POA: Insufficient documentation

## 2021-09-03 DIAGNOSIS — M6281 Muscle weakness (generalized): Secondary | ICD-10-CM | POA: Diagnosis present

## 2021-09-03 DIAGNOSIS — R2689 Other abnormalities of gait and mobility: Secondary | ICD-10-CM | POA: Diagnosis present

## 2021-09-03 DIAGNOSIS — R269 Unspecified abnormalities of gait and mobility: Secondary | ICD-10-CM | POA: Insufficient documentation

## 2021-09-03 DIAGNOSIS — R2681 Unsteadiness on feet: Secondary | ICD-10-CM | POA: Insufficient documentation

## 2021-09-03 DIAGNOSIS — R262 Difficulty in walking, not elsewhere classified: Secondary | ICD-10-CM | POA: Insufficient documentation

## 2021-09-03 NOTE — Therapy (Signed)
[place] MAIN [facility] SERVICES [address] [place], [place], [address] Phone: [phone]   Fax:  [phone]  Physical Therapy Treatment/Physical Therapy Progress Note   Dates of reporting period  07/16/2021   to   09/03/2021  Patient Details  Name: Jose Castro MRN: [id number] Date of Birth: 1926/11/04 Referring Provider (PT): Dr. Ancil Boozer   Encounter Date: 09/03/2021   PT End of Session - 09/03/21 1400     Visit Number 60    Number of Visits 58    Date for PT Re-Evaluation 11/05/21    Authorization Type eval: 09/20/20, Progress note 01/02/2021, PN on 04/03/6643, Recert = 0/34/7425-9/56/3875; PN on 4/202022; PN on 05/04/3328; Recert= 5/18/84166-0/05/3015; PN on 06/11/21; PN on 0/08/9322; Recert on 5/57/3220- 25/03/2705    PT Start Time 1351    PT Stop Time 1429    PT Time Calculation (min) 38 min    Equipment Utilized During Treatment Gait belt    Activity Tolerance Patient tolerated treatment well    Behavior During Therapy WFL for tasks assessed/performed             Past Medical History:  Diagnosis Date   Allergy    Penicillin   Arthritis    Basal cell carcinoma    BPH (benign prostatic hyperplasia)    Chronic kidney disease    had a kidney stone which per family was a cyst that was removed   Dysrhythmia    GERD (gastroesophageal reflux disease)    History of kidney stones    Hyperlipidemia    Hypertension    Left-sided low back pain with left-sided sciatica    Lung mass    Splenic vein thrombosis    Stroke ([place]) 05/22/2019   Thrombosis 12/2015   mural  area and no notation of heart attack    Past Surgical History:  Procedure Laterality Date   APPENDECTOMY     CATARACT EXTRACTION, BILATERAL     CYSTOSCOPY WITH INSERTION OF UROLIFT     ENDOSCOPIC RETROGRADE CHOLANGIOPANCREATOGRAPHY (ERCP) WITH PROPOFOL N/A 05/11/2020   Procedure: ENDOSCOPIC RETROGRADE CHOLANGIOPANCREATOGRAPHY (ERCP) WITH PROPOFOL;  Surgeon: [name], MD;   Location: [facility] ENDOSCOPY;  Service: Endoscopy;  Laterality: N/A;   ERCP N/A 10/23/2020   Procedure: ENDOSCOPIC RETROGRADE CHOLANGIOPANCREATOGRAPHY (ERCP);  Surgeon: [name], MD;  Location: [facility] ENDOSCOPY;  Service: Endoscopy;  Laterality: N/A;   EYE SURGERY  08/2018   IR EXCHANGE BILIARY DRAIN  05/04/2020   KIDNEY STONE SURGERY     KNEE SURGERY     PACEMAKER INSERTION N/A 07/13/2018   Procedure: INSERTION PACEMAKER-DUEL CHAMBER INITIAL IMPLANT;  Surgeon: Isaias Cowman, MD;  Location: [facility] ORS;  Service: Cardiovascular;  Laterality: N/A;   SPINE SURGERY      There were no vitals filed for this visit.   Subjective Assessment - 09/03/21 1357     Subjective Patient reports he was feeling better but woke up with intense pain in left hamstring area again today.    Patient is accompained by: Family member    Pertinent History Pt referred for deconditioning and gait instability. He has had a decline since he last finished therapy. Pt had a hospital admission from 05/02/20 to 05/14/20 for acute cholecystitis and is now s/p cholecystostomy drain placement. Drain is still in place and pt is waiting to hear when they plan to remove it. Pt discharged to SNF and then returned home. In addition pt underwent radiation therapy for a RUL lung mass/cancer and had his last  radiation treatment in September of 2021. He reports that he has continued to walk at home as his primary exercise. Pt was previously seen at this clinic for PT/OT due to weakness/imbalance s/p CVA. Prior history from 09/21/19: Pt is a 87 year old male who presents with imbalance and difficulty with gait following a right frontal CVA with left hemiparesis on 05/22/19.  He has completed inpatient and home health PT, and now presents for OP PT.  He was discharged from inpatient rehab at a supervision level of assistance, ambulating with a RW.  His family notes that he is impulsive and will try to ambulate without his RW at home.  Family is  currently providing 24/7 supervision.    Limitations House hold activities;Walking    How long can you sit comfortably? no limitations    How long can you walk comfortably? he requires a RW    Patient Stated Goals Pt wants to be able to walk his granddaughter down the aisle at her wedding November 2022    Currently in Pain? Yes    Pain Score 7    Patient reports pain was a 10/10 earlier today.   Pain Location Leg    Pain Orientation Left;Posterior    Pain Descriptors / Indicators Aching    Pain Type Acute pain    Pain Onset 1 to 4 weeks ago    Pain Frequency Intermittent    Aggravating Factors  transfers, Prlonged sitting/standing and walking    Pain Relieving Factors Rest, Ice, heat    Effect of Pain on Daily Activities Limited standing/walking             INTERVENTIONS:   Moist heat to left posterior leg x 8 min with gentle self active ROM- bending left knee.   Reassessment of goals:  TUG= 16.83 sec using 4WW (improved from last tested= 20.11 sec)   6 min walk test: 750 feet with use of 4WW (recovering from COVID and strained left hamstring)  5 x Sit to Stand= 20.97 sec with B UE support - Limited by left hamstring pain. (Improved from 9/13- 24.2 sec)   Sit to stand with min BUE x 10 reps ( VC for correct technique)   Clinical Impression: Patient continues to recover from recent bout of COVID and from last week hamstring strain. He was able to participate well with initial heat and rest breaks to perform progress note today. He was able to demo some improvement despite recent physical set- back. He was limited in ambulation due to fatigue but did demo improved step length early in 6 min walk test. Patient's condition has the potential to improve in response to therapy. Maximum improvement is yet to be obtained. The anticipated improvement is attainable and reasonable in a generally predictable time.                                   PT Education  - 09/03/21 1400     Education Details Pain relief ed for hamstring.    Person(s) Educated Patient    Methods Explanation;Demonstration;Tactile cues;Verbal cues    Comprehension Verbalized understanding;Returned demonstration;Verbal cues required;Need further instruction;Tactile cues required              PT Short Term Goals - 01/21/21 1438       PT SHORT TERM GOAL #1   Title Pt will be independent with HEP in order to improve strength and balance in  order to decrease fall risk and improve function at home.    Baseline 01/02/2021- Patient verbalize understanding of walking and seated exercises for home program. 01/21/21 patient has been more active and understands HEP.    Time 6    Period Weeks    Status Achieved    Target Date 01/10/21               PT Long Term Goals - 09/03/21 1405       PT LONG TERM GOAL #1   Title Pt will improve BERG by at least 3 points in order to demonstrate clinically significant improvement in balance.    Baseline 09/20/20: 32/56 (Previous discharge: 03/06/20: 46/56), 12/14: 32/56, 1/13: 31/56, 01/16/21 35/56, 02/06/2021=36/56. 02/18/2021= 37/56.03/20/2021- Deferred secondary to left knee aching- will attempt next visit. 05/06/2021= 39/56; 05/13/2021= 36/56 *patient having more left knee pain. 07/16/2021= 40/56    Time 12    Period Weeks    Status Achieved      PT LONG TERM GOAL #2   Title Pt will decrease TUG to below 14 seconds/decrease in order to demonstrate decreased fall risk.    Baseline 09/20/20: 19.2s, 12/14: 17.48, 1/13: 16.65 sec. 01/02/2021=15.75 sec using 4WW, 01/21/21  14.37 seconds. 02/06/2021= 14.92 sec using 4WW. 02/18/2021= 14.5 sec using 4WW. 03/20/2021- Deferred secondary to left knee aching- will attempt next visit. 05/06/2021= 16.32 sec avg using 4WW. 05/13/2021= 16.5 sec with 4WW *increased left knee pain. 06/11/21: 20.11 seconds (increased time to turn) using 4WW;  08/13/2021- Deferred testing secondary to patient not feeling well (recovering from  recent bout of COVID). Will test next 1-2 sessions. 09/03/2021= 16.83 sec using 4WW    Time 12    Period Weeks    Status On-going    Target Date 11/05/21      PT LONG TERM GOAL #3   Title Pt will decrease 5TSTS by at least 3 seconds in order to demonstrate clinically significant improvement in LE strength.    Baseline 09/20/20: 23.1s with minA+1 for strength and balance (Previous discharge 03/06/20: 16.1s), 12/14: 20 sec, 1/13: 22 sec.   01/02/2021= 16.05 sec with light UE support. 01/21/21 01/21/21 37.90 seconds without UE, with UE support 12.93 seconds: 02/06/2021= with 1 UE 17.93 sec. 02/18/2021= 21.33 sec without UE support. 05/06/2021= 15.3 sec with BUE Support and 21. 0 sec without UE support. 05/13/2021=15.0 sec with BUE support; 09/03/2021= 16 sec without UE support.    Time 12    Period Weeks    Status Achieved      PT LONG TERM GOAL #4   Title Pt will increase his self-selected 10MWT to >0.74 m/s in order to demonstrate improvement in gait speed and improved community ambulation    Baseline 09/20/20: self-selected: 14.4s = 0.69 m/s with rollator (Previous discharge 03/06/20: self-selected: 13.6s = 0.74 m/s), 12/14: 0.75 m/s, 1/13: 0.78 m/s. 01/02/2021= 0.85 m/s using 4WW, 01/16/21 with 4 WW .99 m/s. 02/06/2021= self selected = 0.93 m/s with 2OV    Time 4    Period Weeks    Status Achieved      PT LONG TERM GOAL #5   Title Patient will increase six minute walk test distance to >1000 for progression to community ambulator and improve gait ability    Baseline 01/10/20  620 ft with rollator, 01/21/21  965 ft; 02/06/2021= 910 feet with 4WW.  02/18/2021= 950 feet with 4WW. 05/06/2021= will test next visit. 05/13/2021- Deferred secondary to increased left knee pain. 06/11/21: 78ft with 7CH *knee pain  throughout test. 07/11/2021= 900 feet with use of 4WW.  08/13/2021- Deferred testing secondary to patient not feeling well (recovering from recent bout of COVID). Will test next 1-2 sessions. 09/03/2021= 750 feet with use  of 4WW (recovering from COVID and strained left hamstring)    Time 12    Period Weeks    Status On-going    Target Date 11/05/21      PT LONG TERM GOAL #6   Title Patient will demonstrate ability to walk with HHA +1 > 200 ft. without loss of balance on level surfaces to simulate walking his grandaughter down the aisle for her upcoming wedding in November 2022.    Baseline 05/13/2021- Patient currently ambulating short community distance with use of 4WW but desires to walk with his grandaughter with HHA for her wedding. 06/11/21: attempted 5 steps with HHA x1, deemed unsafe. 07/16/2021- Patient ambulated around 100 feet with HHA (arms interlocked) with PT to simulate walking daughter down aisle.  08/13/2021- Deferred testing secondary to patient not feeling well (recovering from recent bout of COVID). Will test next 1-2 sessions.    Time 12    Period Weeks    Status On-going    Target Date 11/05/21      PT LONG TERM GOAL #7   Title Pt will decrease 5TSTS  to 14 sec or less with BUE support on armrest in order to demonstrate clinically significant improvement in LE strength.    Baseline 05/13/2021=15.0 sec with BUE support; 26 sec without UE support. 06/11/21: 14.61 seconds with BUE support. 07/16/2021= 23 sec without UE support. 08/13/2021= 24. 2 sec with BUE support. 09/03/2021= 20.97 sec with B UE support - Limited by left hamstring pain.    Time 12    Period Weeks    Status On-going    Target Date 11/05/21                   Plan - 09/03/21 0859     Clinical Impression Statement Patient continues to recover from recent bout of COVID and from last week hamstring strain. He was able to participate well with initial heat and rest breaks to perform progress note today. He was able to demo some improvement despite recent physical set- back. He was limited in ambulation due to fatigue but did demo improved step length early in 6 min walk test. Patient's condition has the potential to improve in  response to therapy. Maximum improvement is yet to be obtained. The anticipated improvement is attainable and reasonable in a generally predictable time.    Personal Factors and Comorbidities Age;Comorbidity 3+    Comorbidities HTN, CVA, cholecystitis with cholecystostomy drain    Examination-Activity Limitations Bend;Lift;Squat;Locomotion Level;Stairs;Stand    Examination-Participation Restrictions Community Activity;Driving;Laundry;Meal Prep;Shop    Stability/Clinical Decision Making Unstable/Unpredictable    Rehab Potential Fair    PT Frequency 2x / week    PT Duration 12 weeks    PT Treatment/Interventions ADLs/Self Care Home Management;Cryotherapy;Electrical Stimulation;Iontophoresis 4mg /ml Dexamethasone;Moist Heat;Traction;Ultrasound;DME Instruction;Gait training;Stair training;Therapeutic activities;Therapeutic exercise;Functional mobility training;Balance training;Neuromuscular re-education;Patient/family education;Manual techniques;Dry needling;Vestibular;Joint Manipulations;Spinal Manipulations;Aquatic Therapy    PT Next Visit Plan Continue to focus on LE strengthening and balance, dynamic balance tasks    PT Home Exercise Plan no changes    Consulted and Agree with Plan of Care Patient             Patient will benefit from skilled therapeutic intervention in order to improve the following deficits and impairments:  Abnormal gait, Decreased balance, Decreased  mobility, Difficulty walking, Decreased knowledge of precautions, Decreased safety awareness, Decreased activity tolerance, Decreased strength, Decreased endurance  Visit Diagnosis: Abnormality of gait and mobility  Difficulty in walking, not elsewhere classified  Muscle weakness (generalized)  Unsteadiness on feet     Problem List Patient Active Problem List   Diagnosis Date Noted   Localized osteoarthritis of knees, bilateral 06/13/2021   Moderate protein-calorie malnutrition ([place]) 03/11/2021   Hemiparesis  affecting left side as late effect of cerebrovascular accident (CVA) ([place]) 03/11/2021   Aneurysm, aorta, thoracic 12/21/2020   Calculus of bile duct without cholecystitis and without obstruction    Non-small cell carcinoma of lung, right ([place]) 09/07/2020   Mass of upper lobe of right lung 07/13/2020   Calculus of bile duct with cholecystitis without obstruction    Abnormal findings on imaging of biliary tract    Acute cholecystitis 05/02/2020   Chronic venous insufficiency 12/05/2019   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    CKD (chronic kidney disease), stage II    New onset atrial fibrillation ([place])    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   Goals of care, counseling/discussion    Palliative care by specialist    DNR (do not resuscitate) discussion    History of CVA (cerebrovascular accident) 05/22/2019   Persistent proteinuria 07/30/2018   Mobitz type 2 second degree heart block 07/13/2018   Chronic kidney disease, stage III (moderate) ([place]) 06/15/2018   Leg pain 05/06/2018   Lymphedema 05/06/2018   Coagulopathy ([place]) 07/07/2017   Lumbar spondylosis 03/10/2017   Elevated uric acid in blood 11/20/2016   BPH (benign prostatic hyperplasia) 05/29/2016   Peripheral vascular disease of lower extremity ([place]) 03/03/2016   Gallstone 12/31/2015   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   Atherosclerosis of aorta ([place]) 11/22/2015   Carotid artery narrowing 11/22/2015   Diverticulosis of colon 11/22/2015   Decreased creatinine clearance 11/22/2015   Arthritis, degenerative 11/22/2015   Lactose intolerance 11/22/2015   Basal cell carcinoma 11/22/2015   Essential hypertension 06/21/2015   Hyperlipemia 06/21/2015   GERD (gastroesophageal reflux disease) 06/21/2015   Calculus of kidney 11/18/2013    Lewis Moccasin, PT 09/04/2021, 9:53 AM  [place] [address]  [place], [place], [address] Phone: [phone]   Fax:  [phone]  Name: Jose Castro MRN: [id number] Date of Birth: 05/01/1926

## 2021-09-05 ENCOUNTER — Other Ambulatory Visit: Payer: Self-pay

## 2021-09-05 ENCOUNTER — Ambulatory Visit: Payer: Medicare Other

## 2021-09-05 DIAGNOSIS — R269 Unspecified abnormalities of gait and mobility: Secondary | ICD-10-CM

## 2021-09-05 DIAGNOSIS — R262 Difficulty in walking, not elsewhere classified: Secondary | ICD-10-CM

## 2021-09-05 DIAGNOSIS — R2681 Unsteadiness on feet: Secondary | ICD-10-CM

## 2021-09-05 DIAGNOSIS — M6281 Muscle weakness (generalized): Secondary | ICD-10-CM

## 2021-09-05 NOTE — Therapy (Signed)
[place] MAIN [facility] SERVICES [address] [place], [place], [address] Phone: [phone]   Fax:  [phone]  Physical Therapy Treatment  Patient Details  Name: Jose Castro MRN: [id number] Date of Birth: 10-Jun-1926 Referring Provider (PT): Dr. Ancil Boozer   Encounter Date: 09/05/2021   PT End of Session - 09/05/21 1355     Visit Number 28    Number of Visits 39    Date for PT Re-Evaluation 11/05/21    Authorization Type eval: 09/20/20, Progress note 01/02/2021, PN on 08/05/6386, Recert = 5/64/3329-04/18/8415; PN on 4/202022; PN on 6/0/6301; Recert= 6/01/09323-04/04/7321; PN on 06/11/21; PN on 0/25/4270; Recert on 05/24/7627- 31/03/1760; PN on 09/03/2021    PT Start Time 1347    PT Stop Time 1427    PT Time Calculation (min) 40 min    Equipment Utilized During Treatment Gait belt    Activity Tolerance Patient tolerated treatment well    Behavior During Therapy WFL for tasks assessed/performed             Past Medical History:  Diagnosis Date   Allergy    Penicillin   Arthritis    Basal cell carcinoma    BPH (benign prostatic hyperplasia)    Chronic kidney disease    had a kidney stone which per family was a cyst that was removed   Dysrhythmia    GERD (gastroesophageal reflux disease)    History of kidney stones    Hyperlipidemia    Hypertension    Left-sided low back pain with left-sided sciatica    Lung mass    Splenic vein thrombosis    Stroke ([place]) 05/22/2019   Thrombosis 12/2015   mural  area and no notation of heart attack    Past Surgical History:  Procedure Laterality Date   APPENDECTOMY     CATARACT EXTRACTION, BILATERAL     CYSTOSCOPY WITH INSERTION OF UROLIFT     ENDOSCOPIC RETROGRADE CHOLANGIOPANCREATOGRAPHY (ERCP) WITH PROPOFOL N/A 05/11/2020   Procedure: ENDOSCOPIC RETROGRADE CHOLANGIOPANCREATOGRAPHY (ERCP) WITH PROPOFOL;  Surgeon: [name], MD;  Location: [facility] ENDOSCOPY;  Service: Endoscopy;  Laterality: N/A;   ERCP  N/A 10/23/2020   Procedure: ENDOSCOPIC RETROGRADE CHOLANGIOPANCREATOGRAPHY (ERCP);  Surgeon: [name], MD;  Location: [facility] ENDOSCOPY;  Service: Endoscopy;  Laterality: N/A;   EYE SURGERY  08/2018   IR EXCHANGE BILIARY DRAIN  05/04/2020   KIDNEY STONE SURGERY     KNEE SURGERY     PACEMAKER INSERTION N/A 07/13/2018   Procedure: INSERTION PACEMAKER-DUEL CHAMBER INITIAL IMPLANT;  Surgeon: Isaias Cowman, MD;  Location: [facility] ORS;  Service: Cardiovascular;  Laterality: N/A;   SPINE SURGERY      There were no vitals filed for this visit.   Subjective Assessment - 09/05/21 1353     Subjective Patient reports he was able to get out yesterday and go to lunch.    Patient is accompained by: Family member    Pertinent History Pt referred for deconditioning and gait instability. He has had a decline since he last finished therapy. Pt had a hospital admission from 05/02/20 to 05/14/20 for acute cholecystitis and is now s/p cholecystostomy drain placement. Drain is still in place and pt is waiting to hear when they plan to remove it. Pt discharged to SNF and then returned home. In addition pt underwent radiation therapy for a RUL lung mass/cancer and had his last radiation treatment in September of 2021. He reports that he has continued to walk at home as his primary  exercise. Pt was previously seen at this clinic for PT/OT due to weakness/imbalance s/p CVA. Prior history from 09/21/19: Pt is a 87 year old male who presents with imbalance and difficulty with gait following a right frontal CVA with left hemiparesis on 05/22/19.  He has completed inpatient and home health PT, and now presents for OP PT.  He was discharged from inpatient rehab at a supervision level of assistance, ambulating with a RW.  His family notes that he is impulsive and will try to ambulate without his RW at home.  Family is currently providing 24/7 supervision.    Limitations House hold activities;Walking    How long can you sit  comfortably? no limitations    How long can you walk comfortably? he requires a RW    Patient Stated Goals Pt wants to be able to walk his granddaughter down the aisle at her wedding November 2022    Currently in Pain? No/denies    Pain Onset 1 to 4 weeks ago              INTERVENTIONS:   Therapeutic Exercises: All using 2.5 lb ankle weight  Seated hip march BLE 2 x 12 reps Seated knee ext BLE 2 x 12 reps Seated hip march/abd (up and over hedgehog)  x 12 reps  Dynamic side step (2.5 lb ankle weight)  to right length of support bar approx 3 step each direction x 12 each LE.  Patient reported fatigue upon completion.   Sit to stand x 12 reps with 1 arm assist and no complaint of left knee pain.   Dynamic marching on blue airex pad with 2.5 lb ankle weight B x 12 reps with 1 UE support   Static standing on blue airex pad with feet apart x 30 sec x 2 with mild unsteadiness yet no loss of balance. Next progressed to feet narrowed with marked increase in lateral sway requiring UE for support     Calf raises on 1/2 foam roll x 15 using 2.5 lb AW  Gait in clinic 150 feet in 1 min 24 sec (using 2.5 AW) on left LE followed by 2 min rest then another 150 feet at  1 min 8 sec without an AW.   Education provided throughout session via VC/TC and demonstration to facilitate movement at target joints and correct muscle activation for all testing and exercises performed.    Clinical Impression: Patient is progressing again after recovering from recently having COVID and left hamstring pain. He was able to progress with resistance and repetitions today. He was more perky and moving around better- responsive to Cues and more conversational today. He was able to complete more tasks today and did not report any pain during session today. Pt will benefit from further skilled PT to improve BLE strength, balance and functional mobility                   PT Education - 09/05/21 1354      Education Details Exercise technique    Person(s) Educated Patient    Methods Explanation;Demonstration;Tactile cues;Verbal cues    Comprehension Verbalized understanding;Returned demonstration;Verbal cues required;Need further instruction              PT Short Term Goals - 01/21/21 1438       PT SHORT TERM GOAL #1   Title Pt will be independent with HEP in order to improve strength and balance in order to decrease fall risk and improve function at home.  Baseline 01/02/2021- Patient verbalize understanding of walking and seated exercises for home program. 01/21/21 patient has been more active and understands HEP.    Time 6    Period Weeks    Status Achieved    Target Date 01/10/21               PT Long Term Goals - 09/03/21 1405       PT LONG TERM GOAL #1   Title Pt will improve BERG by at least 3 points in order to demonstrate clinically significant improvement in balance.    Baseline 09/20/20: 32/56 (Previous discharge: 03/06/20: 46/56), 12/14: 32/56, 1/13: 31/56, 01/16/21 35/56, 02/06/2021=36/56. 02/18/2021= 37/56.03/20/2021- Deferred secondary to left knee aching- will attempt next visit. 05/06/2021= 39/56; 05/13/2021= 36/56 *patient having more left knee pain. 07/16/2021= 40/56    Time 12    Period Weeks    Status Achieved      PT LONG TERM GOAL #2   Title Pt will decrease TUG to below 14 seconds/decrease in order to demonstrate decreased fall risk.    Baseline 09/20/20: 19.2s, 12/14: 17.48, 1/13: 16.65 sec. 01/02/2021=15.75 sec using 4WW, 01/21/21  14.37 seconds. 02/06/2021= 14.92 sec using 4WW. 02/18/2021= 14.5 sec using 4WW. 03/20/2021- Deferred secondary to left knee aching- will attempt next visit. 05/06/2021= 16.32 sec avg using 4WW. 05/13/2021= 16.5 sec with 4WW *increased left knee pain. 06/11/21: 20.11 seconds (increased time to turn) using 4WW;  08/13/2021- Deferred testing secondary to patient not feeling well (recovering from recent bout of COVID). Will test next 1-2  sessions. 09/03/2021= 16.83 sec using 4WW    Time 12    Period Weeks    Status On-going    Target Date 11/05/21      PT LONG TERM GOAL #3   Title Pt will decrease 5TSTS by at least 3 seconds in order to demonstrate clinically significant improvement in LE strength.    Baseline 09/20/20: 23.1s with minA+1 for strength and balance (Previous discharge 03/06/20: 16.1s), 12/14: 20 sec, 1/13: 22 sec.   01/02/2021= 16.05 sec with light UE support. 01/21/21 01/21/21 37.90 seconds without UE, with UE support 12.93 seconds: 02/06/2021= with 1 UE 17.93 sec. 02/18/2021= 21.33 sec without UE support. 05/06/2021= 15.3 sec with BUE Support and 21. 0 sec without UE support. 05/13/2021=15.0 sec with BUE support; 09/03/2021= 16 sec without UE support.    Time 12    Period Weeks    Status Achieved      PT LONG TERM GOAL #4   Title Pt will increase his self-selected 10MWT to >0.74 m/s in order to demonstrate improvement in gait speed and improved community ambulation    Baseline 09/20/20: self-selected: 14.4s = 0.69 m/s with rollator (Previous discharge 03/06/20: self-selected: 13.6s = 0.74 m/s), 12/14: 0.75 m/s, 1/13: 0.78 m/s. 01/02/2021= 0.85 m/s using 4WW, 01/16/21 with 4 WW .99 m/s. 02/06/2021= self selected = 0.93 m/s with 9JY    Time 4    Period Weeks    Status Achieved      PT LONG TERM GOAL #5   Title Patient will increase six minute walk test distance to >1000 for progression to community ambulator and improve gait ability    Baseline 01/10/20  620 ft with rollator, 01/21/21  965 ft; 02/06/2021= 910 feet with 4WW.  02/18/2021= 950 feet with 4WW. 05/06/2021= will test next visit. 05/13/2021- Deferred secondary to increased left knee pain. 06/11/21: 770ft with 7WG *knee pain throughout test. 07/11/2021= 900 feet with use of 4WW.  08/13/2021- Deferred testing  secondary to patient not feeling well (recovering from recent bout of COVID). Will test next 1-2 sessions. 09/03/2021= 750 feet with use of 4WW (recovering from COVID and strained  left hamstring)    Time 12    Period Weeks    Status On-going    Target Date 11/05/21      PT LONG TERM GOAL #6   Title Patient will demonstrate ability to walk with HHA +1 > 200 ft. without loss of balance on level surfaces to simulate walking his grandaughter down the aisle for her upcoming wedding in November 2022.    Baseline 05/13/2021- Patient currently ambulating short community distance with use of 4WW but desires to walk with his grandaughter with HHA for her wedding. 06/11/21: attempted 5 steps with HHA x1, deemed unsafe. 07/16/2021- Patient ambulated around 100 feet with HHA (arms interlocked) with PT to simulate walking daughter down aisle.  08/13/2021- Deferred testing secondary to patient not feeling well (recovering from recent bout of COVID). Will test next 1-2 sessions.    Time 12    Period Weeks    Status On-going    Target Date 11/05/21      PT LONG TERM GOAL #7   Title Pt will decrease 5TSTS  to 14 sec or less with BUE support on armrest in order to demonstrate clinically significant improvement in LE strength.    Baseline 05/13/2021=15.0 sec with BUE support; 26 sec without UE support. 06/11/21: 14.61 seconds with BUE support. 07/16/2021= 23 sec without UE support. 08/13/2021= 24. 2 sec with BUE support. 09/03/2021= 20.97 sec with B UE support - Limited by left hamstring pain.    Time 12    Period Weeks    Status On-going    Target Date 11/05/21                   Plan - 09/05/21 1356     Clinical Impression Statement Patient is progressing again after recovering from recently having COVID and left hamstring pain. He was able to progress with resistance and repetitions today. He was more perky and moving around better- responsive to Cues and more conversational today. He was able to complete more tasks today and did not report any pain during session today. Pt will benefit from further skilled PT to improve BLE strength, balance and functional mobility    Personal  Factors and Comorbidities Age;Comorbidity 3+    Comorbidities HTN, CVA, cholecystitis with cholecystostomy drain    Examination-Activity Limitations Bend;Lift;Squat;Locomotion Level;Stairs;Stand    Examination-Participation Restrictions Community Activity;Driving;Laundry;Meal Prep;Shop    Stability/Clinical Decision Making Unstable/Unpredictable    Rehab Potential Fair    PT Frequency 2x / week    PT Duration 12 weeks    PT Treatment/Interventions ADLs/Self Care Home Management;Cryotherapy;Electrical Stimulation;Iontophoresis 4mg /ml Dexamethasone;Moist Heat;Traction;Ultrasound;DME Instruction;Gait training;Stair training;Therapeutic activities;Therapeutic exercise;Functional mobility training;Balance training;Neuromuscular re-education;Patient/family education;Manual techniques;Dry needling;Vestibular;Joint Manipulations;Spinal Manipulations;Aquatic Therapy    PT Next Visit Plan Continue to focus on LE strengthening and balance, dynamic balance tasks    PT Home Exercise Plan no changes    Consulted and Agree with Plan of Care Patient             Patient will benefit from skilled therapeutic intervention in order to improve the following deficits and impairments:  Abnormal gait, Decreased balance, Decreased mobility, Difficulty walking, Decreased knowledge of precautions, Decreased safety awareness, Decreased activity tolerance, Decreased strength, Decreased endurance  Visit Diagnosis: Abnormality of gait and mobility  Difficulty in walking, not elsewhere classified  Muscle weakness (generalized)  Unsteadiness on feet     Problem List Patient Active Problem List   Diagnosis Date Noted   Localized osteoarthritis of knees, bilateral 06/13/2021   Moderate protein-calorie malnutrition ([place]) 03/11/2021   Hemiparesis affecting left side as late effect of cerebrovascular accident (CVA) ([place]) 03/11/2021   Aneurysm, aorta, thoracic 12/21/2020   Calculus of bile duct without  cholecystitis and without obstruction    Non-small cell carcinoma of lung, right ([place]) 09/07/2020   Mass of upper lobe of right lung 07/13/2020   Calculus of bile duct with cholecystitis without obstruction    Abnormal findings on imaging of biliary tract    Acute cholecystitis 05/02/2020   Chronic venous insufficiency 12/05/2019   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    CKD (chronic kidney disease), stage II    New onset atrial fibrillation ([place])    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   Goals of care, counseling/discussion    Palliative care by specialist    DNR (do not resuscitate) discussion    History of CVA (cerebrovascular accident) 05/22/2019   Persistent proteinuria 07/30/2018   Mobitz type 2 second degree heart block 07/13/2018   Chronic kidney disease, stage III (moderate) ([place]) 06/15/2018   Leg pain 05/06/2018   Lymphedema 05/06/2018   Coagulopathy ([place]) 07/07/2017   Lumbar spondylosis 03/10/2017   Elevated uric acid in blood 11/20/2016   BPH (benign prostatic hyperplasia) 05/29/2016   Peripheral vascular disease of lower extremity ([place]) 03/03/2016   Gallstone 12/31/2015   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   Atherosclerosis of aorta ([place]) 11/22/2015   Carotid artery narrowing 11/22/2015   Diverticulosis of colon 11/22/2015   Decreased creatinine clearance 11/22/2015   Arthritis, degenerative 11/22/2015   Lactose intolerance 11/22/2015   Basal cell carcinoma 11/22/2015   Essential hypertension 06/21/2015   Hyperlipemia 06/21/2015   GERD (gastroesophageal reflux disease) 06/21/2015   Calculus of kidney 11/18/2013    Lewis Moccasin, PT 09/06/2021, 9:01 AM  [place] MAIN [facility] SERVICES [address] [place], [place], [address] Phone: [phone]   Fax:  [phone]  Name: DAMETRIUS SANJUAN MRN: [id number] Date of Birth: [birth year]

## 2021-09-10 ENCOUNTER — Ambulatory Visit: Payer: Medicare Other

## 2021-09-10 ENCOUNTER — Encounter: Payer: Self-pay | Admitting: Family Medicine

## 2021-09-10 ENCOUNTER — Ambulatory Visit: Payer: Medicare Other | Admitting: Family Medicine

## 2021-09-10 ENCOUNTER — Other Ambulatory Visit: Payer: Self-pay

## 2021-09-10 VITALS — BP 120/72 | HR 66 | Temp 97.4°F | Resp 16 | Ht 70.0 in | Wt 152.0 lb

## 2021-09-10 DIAGNOSIS — I700 Atherosclerosis of aorta: Secondary | ICD-10-CM

## 2021-09-10 DIAGNOSIS — N1831 Chronic kidney disease, stage 3a: Secondary | ICD-10-CM

## 2021-09-10 DIAGNOSIS — C3491 Malignant neoplasm of unspecified part of right bronchus or lung: Secondary | ICD-10-CM

## 2021-09-10 DIAGNOSIS — M6281 Muscle weakness (generalized): Secondary | ICD-10-CM

## 2021-09-10 DIAGNOSIS — I739 Peripheral vascular disease, unspecified: Secondary | ICD-10-CM

## 2021-09-10 DIAGNOSIS — E440 Moderate protein-calorie malnutrition: Secondary | ICD-10-CM

## 2021-09-10 DIAGNOSIS — E538 Deficiency of other specified B group vitamins: Secondary | ICD-10-CM

## 2021-09-10 DIAGNOSIS — R2681 Unsteadiness on feet: Secondary | ICD-10-CM

## 2021-09-10 DIAGNOSIS — D692 Other nonthrombocytopenic purpura: Secondary | ICD-10-CM

## 2021-09-10 DIAGNOSIS — R5381 Other malaise: Secondary | ICD-10-CM

## 2021-09-10 DIAGNOSIS — R262 Difficulty in walking, not elsewhere classified: Secondary | ICD-10-CM

## 2021-09-10 DIAGNOSIS — R269 Unspecified abnormalities of gait and mobility: Secondary | ICD-10-CM | POA: Diagnosis not present

## 2021-09-10 DIAGNOSIS — R790 Abnormal level of blood mineral: Secondary | ICD-10-CM

## 2021-09-10 DIAGNOSIS — I69354 Hemiplegia and hemiparesis following cerebral infarction affecting left non-dominant side: Secondary | ICD-10-CM

## 2021-09-10 DIAGNOSIS — I482 Chronic atrial fibrillation, unspecified: Secondary | ICD-10-CM

## 2021-09-10 DIAGNOSIS — E876 Hypokalemia: Secondary | ICD-10-CM

## 2021-09-10 NOTE — Progress Notes (Signed)
Name: Jose Castro   MRN: [id number]    DOB: 12-Mar-1926   Date:09/10/2021       Progress Note  Subjective  Chief Complaint  Follow Up  HPI  Percutaneous cholecystostomy : he was admitted on 05/02/2020 and stayed until June 14 th 2021 ,he had a percutaneous cholecystotomy placed but doing well. Appetite is not back to normal yet, but has improved, weight is stable now.   Malnutrition: his weight is stable now, but still malnourished , he is feeling stronger, appetite has improved since COVID-19 and is having Boost daily   Solitary lung mass: diagnosed this Summer by CT chest, he is seeing Dr. Arthor Captain and Dr. 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 He has been taking Eliquis given by Dr. Clayborn Bigness, he completed rehab for left side weakness but is still having some lower extremity weakness and uses a walker to assist with gait. He had a set back during his episode of COVID -49 in September but is having PT again and feeling better   Hyperlipidemia/atherosclerosis of Aorta : reviewed labs done April 2022  he has been taking Crestor and denies side effects, last LDL was 39    GERD: he is on PPI and symptoms have been controlled, no problems at this time    PAD; he is wearing compression stocking hoses, seen by vascular surgeon , he on statin and eliquis, denies claudication but only walks short distances due to left side weakness and knee problems  CKI stage III: also low magnesium and potassium, he has not been taking supplements because family states he has loose stools a few times a week, they will try to increase potassium and magnesium in his diet and recheck labs in one  to two weeks   Patient Active Problem List   Diagnosis Date Noted   Localized osteoarthritis of knees, bilateral 06/13/2021   Moderate protein-calorie malnutrition ([place]) 03/11/2021   Hemiparesis affecting left side as late effect of cerebrovascular accident (CVA) ([place]) 03/11/2021   Aneurysm, aorta, thoracic 12/21/2020   Calculus of bile duct without cholecystitis and without obstruction    Non-small cell carcinoma of lung, right ([place]) 09/07/2020   Mass of upper lobe of right lung 07/13/2020   Calculus of bile duct with cholecystitis without obstruction    Abnormal findings on imaging of biliary tract    Acute cholecystitis 05/02/2020   Chronic venous insufficiency 12/05/2019   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    CKD (chronic kidney disease), stage II    New onset atrial fibrillation ([place])    Right middle cerebral artery stroke ([place]) 05/27/2019   Goals of care, counseling/discussion    Palliative care by specialist    DNR (do not resuscitate) discussion    History of CVA (cerebrovascular accident) 05/22/2019   Persistent proteinuria 07/30/2018   Mobitz type 2 second degree heart block 07/13/2018   Chronic kidney disease, stage III (moderate) ([place]) 06/15/2018   Leg pain 05/06/2018   Lymphedema 05/06/2018   Coagulopathy ([place]) 07/07/2017   Lumbar spondylosis 03/10/2017   Elevated uric acid in blood 11/20/2016   BPH (benign prostatic hyperplasia) 05/29/2016   Peripheral vascular disease of lower extremity (  [place]) 03/03/2016   Gallstone 12/31/2015   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   Atherosclerosis of aorta ([place]) 11/22/2015   Carotid artery narrowing 11/22/2015   Diverticulosis of colon 11/22/2015   Decreased creatinine clearance 11/22/2015   Arthritis, degenerative 11/22/2015   Lactose intolerance 11/22/2015   Basal cell carcinoma 11/22/2015   Essential hypertension 06/21/2015   Hyperlipemia 06/21/2015   GERD (gastroesophageal reflux  disease) 06/21/2015   Calculus of kidney 11/18/2013    Past Surgical History:  Procedure Laterality Date   APPENDECTOMY     CATARACT EXTRACTION, BILATERAL     CYSTOSCOPY WITH INSERTION OF UROLIFT     ENDOSCOPIC RETROGRADE CHOLANGIOPANCREATOGRAPHY (ERCP) WITH PROPOFOL N/A 05/11/2020   Procedure: ENDOSCOPIC RETROGRADE CHOLANGIOPANCREATOGRAPHY (ERCP) WITH PROPOFOL;  Surgeon: [name], MD;  Location: [facility] ENDOSCOPY;  Service: Endoscopy;  Laterality: N/A;   ERCP N/A 10/23/2020   Procedure: ENDOSCOPIC RETROGRADE CHOLANGIOPANCREATOGRAPHY (ERCP);  Surgeon: [name], MD;  Location: [facility] ENDOSCOPY;  Service: Endoscopy;  Laterality: N/A;   EYE SURGERY  08/2018   IR EXCHANGE BILIARY DRAIN  05/04/2020   KIDNEY STONE SURGERY     KNEE SURGERY     PACEMAKER INSERTION N/A 07/13/2018   Procedure: INSERTION PACEMAKER-DUEL CHAMBER INITIAL IMPLANT;  Surgeon: Isaias Cowman, MD;  Location: [facility] ORS;  Service: Cardiovascular;  Laterality: N/A;   SPINE SURGERY      Family History  Problem Relation Age of Onset   Cancer Brother        bladder cancer with mets   Heart disease Mother    Aortic aneurysm Mother    Heart attack Maternal Aunt    Heart attack Maternal Uncle     Social History   Tobacco Use   Smoking status: Former    Packs/day: 0.00    Years: 16.00    Pack years: 0.00    Types: Cigarettes    Start date: 10/21/1944    Quit date: 10/21/1960    Years since quitting: 60.9   Smokeless tobacco: Never  Substance Use Topics   Alcohol use: Yes    Alcohol/week: 3.0 standard drinks    Types: 3 Standard drinks or equivalent per week    Comment: once a week when he goes out to dinner-socially     Current Outpatient Medications:    acetaminophen (TYLENOL) 325 MG tablet, Take 2 tablets (650 mg total) by mouth every 4 (four) hours as needed for mild pain (or temp > 37.5 C (99.5 F))., Disp:  , Rfl:    apixaban (ELIQUIS) 2.5 MG TABS tablet, Take 1 tablet (2.5 mg total) by mouth 2 (two)  times daily., Disp: 60 tablet, Rfl: 0   Cholecalciferol (VITAMIN D3) 50 MCG (2000 UT) TABS, Take 2,000 Units by mouth daily., Disp: 30 tablet, Rfl: 0   Ensure Max Protein (ENSURE MAX PROTEIN) LIQD, Take 330 mLs (11 oz total) by mouth 2 (two) times daily., Disp: , Rfl:    magnesium oxide (MAG-OX) 400 MG tablet, Take 1 tablet (400 mg total) by mouth daily., Disp: 60 tablet, Rfl: 0   metoprolol succinate (TOPROL XL) 25 MG 24 hr tablet, Take 1 tablet (25 mg total) by mouth daily., Disp: 30 tablet, Rfl: 0   Multiple Vitamins-Minerals (ZINC PO), Take 500 mg by mouth 2 (two) times daily., Disp: , Rfl:    Omega-3 Fatty Acids (FISH OIL) 1000 MG CAPS, Take 1,000 mg by mouth daily. , Disp: , Rfl:    pantoprazole (PROTONIX) 40 MG tablet, TAKE ONE TABLET EVERY  DAY, Disp: 90 tablet, Rfl: 3   potassium chloride SA (KLOR-CON) 20 MEQ tablet, Take 1 tablet (20 mEq total) by mouth daily., Disp: 30 tablet, Rfl: 0   rosuvastatin (CRESTOR) 40 MG tablet, Take 1 tablet (40 mg total) by mouth daily at 6 PM., Disp: 30 tablet, Rfl: 0   ursodiol (ACTIGALL) 300 MG capsule, Take 300 mg by mouth daily., Disp: , Rfl:   Allergies  Allergen Reactions   Penicillins Itching and Swelling    Pt reported arm rash and swelling with penicillin administration while he was in the [employer].  However, pt was able to tolerate IV Unasyn without any problem, so penicillin allergy unlikely to be real or serious.  05/14/2020.    I personally reviewed active problem list, medication list, allergies, family history, social history, health maintenance with the patient/caregiver today.   ROS  Constitutional: Negative for fever or weight change.  Respiratory: Negative for cough and shortness of breath.   Cardiovascular: Negative for chest pain or palpitations.  Gastrointestinal: Negative for abdominal pain, no bowel changes.  Musculoskeletal: positive for gait problem but no  joint swelling.  Skin: Negative for rash.  Neurological: Negative  for dizziness or headache.  No other specific complaints in a complete review of systems (except as listed in HPI above).   Objective  Vitals:   09/10/21 1256  BP: 120/72  Pulse: 66  Resp: 16  Temp: (!) 97.4 F (36.3 C)  Weight: 152 lb (68.9 kg)  Height: _0  (1.778 m)    Body mass index is 21.81 kg/m.  Physical Exam  Constitutional: Patient appears well-developed and malnourished . No distress.  HEENT: head atraumatic, normocephalic, pupils equal and reactive to light, neck supple Cardiovascular: Normal rate, regular rhythm and normal heart sounds.  No murmur heard. No BLE edema. Skin: senile purpura both arms  Pulmonary/Chest: Effort normal and breath sounds normal. No respiratory distress. Abdominal: Soft.  There is no tenderness. Psychiatric: Patient has a normal mood and affect. behavior is normal. Judgment and thought content normal.   Recent Results (from the past 2160 hour(s))  I-STAT creatinine     Status: None   Collection Time: 06/21/21 11:14 AM  Result Value Ref Range   Creatinine, Ser 1.20 0.61 - 1.24 mg/dL  Comprehensive metabolic panel     Status: Abnormal   Collection Time: 06/25/21 10:17 AM  Result Value Ref Range   Sodium 137 135 - 145 mmol/L   Potassium 4.3 3.5 - 5.1 mmol/L   Chloride 103 98 - 111 mmol/L   CO2 27 22 - 32 mmol/L   Glucose, Bld 93 70 - 99 mg/dL    Comment: Glucose reference range applies only to samples taken after fasting for at least 8 hours.   BUN 29 (H) 8 - 23 mg/dL   Creatinine, Ser 1.25 (H) 0.61 - 1.24 mg/dL   Calcium 9.2 8.9 - 10.3 mg/dL   Total Protein 6.6 6.5 - 8.1 g/dL   Albumin 4.0 3.5 - 5.0 g/dL   AST 14 (L) 15 - 41 U/L   ALT 8 0 - 44 U/L   Alkaline Phosphatase 58 38 - 126 U/L   Total Bilirubin 1.0 0.3 - 1.2 mg/dL   GFR, Estimated 54 (L) >60 mL/min    Comment: (NOTE) Calculated using the CKD-EPI Creatinine Equation (2021)    Anion gap 7 5 - 15    Comment: Performed at [facility], [address],  [place], [place] [address]  CBC with Differential  Status: Abnormal   Collection Time: 06/25/21 10:17 AM  Result Value Ref Range   WBC 9.1 4.0 - 10.5 K/uL   RBC 4.37 4.22 - 5.81 MIL/uL   Hemoglobin 13.2 13.0 - 17.0 g/dL   HCT 41.4 39.0 - 52.0 %   MCV 94.7 80.0 - 100.0 fL   MCH 30.2 26.0 - 34.0 pg   MCHC 31.9 30.0 - 36.0 g/dL   RDW 14.4 11.5 - 15.5 %   Platelets 240 150 - 400 K/uL   nRBC 0.0 0.0 - 0.2 %   Neutrophils Relative % 59 %   Neutro Abs 5.4 1.7 - 7.7 K/uL   Lymphocytes Relative 24 %   Lymphs Abs 2.1 0.7 - 4.0 K/uL   Monocytes Relative 13 %   Monocytes Absolute 1.1 (H) 0.1 - 1.0 K/uL   Eosinophils Relative 3 %   Eosinophils Absolute 0.3 0.0 - 0.5 K/uL   Basophils Relative 1 %   Basophils Absolute 0.1 0.0 - 0.1 K/uL   Immature Granulocytes 0 %   Abs Immature Granulocytes 0.03 0.00 - 0.07 K/uL    Comment: Performed at [facility], [address] [place], [place] [address]  Vitamin B12     Status: None   Collection Time: 08/16/21 11:47 AM  Result Value Ref Range   Vitamin B-12 645 200 - 1,100 pg/mL  TSH     Status: None   Collection Time: 08/16/21 11:47 AM  Result Value Ref Range   TSH 1.78 0.40 - 4.50 mIU/L  COMPLETE METABOLIC PANEL WITH GFR     Status: Abnormal   Collection Time: 08/16/21 11:47 AM  Result Value Ref Range   Glucose, Bld 98 65 - 99 mg/dL    Comment: .            Fasting reference interval .    BUN 20 7 - 25 mg/dL   Creat 1.55 (H) 0.70 - 1.22 mg/dL   eGFR 42 (L) > OR = 60 mL/min/1.81m    Comment: The eGFR is based on the CKD-EPI 2021 equation. To calculate  the new eGFR from a previous Creatinine or Cystatin C result, go to https://www.kidney.org/professionals/ kdoqi/gfr%5Fcalculator    BUN/Creatinine Ratio 13 6 - 22 (calc)   Sodium 143 135 - 146 mmol/L   Potassium 3.3 (L) 3.5 - 5.3 mmol/L   Chloride 106 98 - 110 mmol/L   CO2 29 20 - 32 mmol/L   Calcium 8.5 (L) 8.6 - 10.3 mg/dL   Total Protein 5.8 (L) 6.1 - 8.1 g/dL   Albumin 3.4  (L) 3.6 - 5.1 g/dL   Globulin 2.4 1.9 - 3.7 g/dL (calc)   AG Ratio 1.4 1.0 - 2.5 (calc)   Total Bilirubin 1.1 0.2 - 1.2 mg/dL   Alkaline phosphatase (APISO) 59 35 - 144 U/L   AST 15 10 - 35 U/L   ALT 11 9 - 46 U/L  CBC with Differential/Platelet     Status: None   Collection Time: 08/16/21 11:47 AM  Result Value Ref Range   WBC 9.0 3.8 - 10.8 Thousand/uL   RBC 4.44 4.20 - 5.80 Million/uL   Hemoglobin 13.2 13.2 - 17.1 g/dL   HCT 41.1 38.5 - 50.0 %   MCV 92.6 80.0 - 100.0 fL   MCH 29.7 27.0 - 33.0 pg   MCHC 32.1 32.0 - 36.0 g/dL   RDW 13.1 11.0 - 15.0 %   Platelets 329 140 - 400 Thousand/uL   MPV 10.8 7.5 - 12.5 fL  Neutro Abs 6,462 1,500 - 7,800 cells/uL   Lymphs Abs 1,503 850 - 3,900 cells/uL   Absolute Monocytes 756 200 - 950 cells/uL   Eosinophils Absolute 225 15 - 500 cells/uL   Basophils Absolute 54 0 - 200 cells/uL   Neutrophils Relative % 71.8 %   Total Lymphocyte 16.7 %   Monocytes Relative 8.4 %   Eosinophils Relative 2.5 %   Basophils Relative 0.6 %  Magnesium     Status: Abnormal   Collection Time: 08/16/21 11:47 AM  Result Value Ref Range   Magnesium 1.4 (L) 1.5 - 2.5 mg/dL    PHQ2/9: Depression screen [facility] 2/9 09/10/2021 08/16/2021 03/28/2021 03/11/2021 09/07/2020  Decreased Interest 0 0 0 0 0  Down, Depressed, Hopeless 0 0 0 0 0  PHQ - 2 Score 0 0 0 0 0  Altered sleeping - - - - -  Tired, decreased energy - - - - -  Change in appetite - - - - -  Feeling bad or failure about yourself  - - - - -  Trouble concentrating - - - - -  Moving slowly or fidgety/restless - - - - -  Suicidal thoughts - - - - -  PHQ-9 Score - - - - -  Some recent data might be hidden    phq 9 is negative   Fall Risk: Fall Risk  09/10/2021 08/16/2021 03/28/2021 03/11/2021 09/07/2020  Falls in the past year? 0 1 0 0 0  Number falls in past yr: 0 0 0 0 0  Injury with Fall? 0 0 0 0 0  Risk for fall due to : Impaired balance/gait Impaired balance/gait;Impaired mobility Impaired  balance/gait - -  Follow up Falls prevention discussed Falls prevention discussed Falls prevention discussed - -      Functional Status Survey: Is the patient deaf or have difficulty hearing?: Yes Does the patient have difficulty seeing, even when wearing glasses/contacts?: No Does the patient have difficulty concentrating, remembering, or making decisions?: No Does the patient have difficulty walking or climbing stairs?: Yes Does the patient have difficulty dressing or bathing?: No Does the patient have difficulty doing errands alone such as visiting a doctor's office or shopping?: Yes    Assessment & Plan  1. Stage 3a chronic kidney disease ([place])  Recheck level   2. Non-small cell carcinoma of lung, right ([place])  Has a PET scan scheduled   3. Senile purpura ([facility])  Stable, on Eliquis   4. Low serum vitamin B12  Continue supplementation   5. Moderate protein-calorie malnutrition ([facility])  - Comprehensive metabolic panel  6. Hemiparesis affecting left side as late effect of cerebrovascular accident (CVA) ([place])  Continue medications, doing well   7. Chronic atrial fibrillation, unspecified ([place])   8. Peripheral vascular disease of lower extremity ([place])   9. Atherosclerosis of aorta ([place])  On statin therapy  10. Gait instability   11. Physical deconditioning  Getting PT   12. Hypokalemia  - Comprehensive metabolic panel  13. Low magnesium level  - Magnesium

## 2021-09-10 NOTE — Therapy (Signed)
[place] MAIN [facility] SERVICES [address] [place], [place], [address] Phone: [phone]   Fax:  [phone]  Physical Therapy Treatment  Patient Details  Name: Jose Castro MRN: [id number] Date of Birth: 16-Dec-1925 Referring Provider (PT): Dr. Ancil Boozer   Encounter Date: 09/10/2021   PT End of Session - 09/10/21 1349     Visit Number 59    Number of Visits 41    Date for PT Re-Evaluation 11/05/21    Authorization Type eval: 09/20/20, Progress note 01/02/2021, PN on 4/0/3474, Recert = 2/59/5638-7/56/4332; PN on 4/202022; PN on 08/06/1883; Recert= 1/66/06301-6/0/1093; PN on 06/11/21; PN on 2/35/5732; Recert on 01/02/5426- 05/02/3761; PN on 09/03/2021    PT Start Time 1336    PT Stop Time 1414    PT Time Calculation (min) 38 min    Equipment Utilized During Treatment Gait belt    Activity Tolerance Patient tolerated treatment well    Behavior During Therapy WFL for tasks assessed/performed             Past Medical History:  Diagnosis Date   Allergy    Penicillin   Arthritis    Basal cell carcinoma    BPH (benign prostatic hyperplasia)    Chronic kidney disease    had a kidney stone which per family was a cyst that was removed   Dysrhythmia    GERD (gastroesophageal reflux disease)    History of kidney stones    Hyperlipidemia    Hypertension    Left-sided low back pain with left-sided sciatica    Lung mass    Splenic vein thrombosis    Stroke ([place]) 05/22/2019   Thrombosis 12/2015   mural  area and no notation of heart attack    Past Surgical History:  Procedure Laterality Date   APPENDECTOMY     CATARACT EXTRACTION, BILATERAL     CYSTOSCOPY WITH INSERTION OF UROLIFT     ENDOSCOPIC RETROGRADE CHOLANGIOPANCREATOGRAPHY (ERCP) WITH PROPOFOL N/A 05/11/2020   Procedure: ENDOSCOPIC RETROGRADE CHOLANGIOPANCREATOGRAPHY (ERCP) WITH PROPOFOL;  Surgeon: [name], MD;  Location: [facility] ENDOSCOPY;  Service: Endoscopy;  Laterality: N/A;    ERCP N/A 10/23/2020   Procedure: ENDOSCOPIC RETROGRADE CHOLANGIOPANCREATOGRAPHY (ERCP);  Surgeon: [name], MD;  Location: [facility] ENDOSCOPY;  Service: Endoscopy;  Laterality: N/A;   EYE SURGERY  08/2018   IR EXCHANGE BILIARY DRAIN  05/04/2020   KIDNEY STONE SURGERY     KNEE SURGERY     PACEMAKER INSERTION N/A 07/13/2018   Procedure: INSERTION PACEMAKER-DUEL CHAMBER INITIAL IMPLANT;  Surgeon: Isaias Cowman, MD;  Location: [facility] ORS;  Service: Cardiovascular;  Laterality: N/A;   SPINE SURGERY      There were no vitals filed for this visit.   Subjective Assessment - 09/10/21 1340     Subjective Patient reports doing well today and states he received a good check up from his MD.    Patient is accompained by: Family member    Pertinent History Pt referred for deconditioning and gait instability. He has had a decline since he last finished therapy. Pt had a hospital admission from 05/02/20 to 05/14/20 for acute cholecystitis and is now s/p cholecystostomy drain placement. Drain is still in place and pt is waiting to hear when they plan to remove it. Pt discharged to SNF and then returned home. In addition pt underwent radiation therapy for a RUL lung mass/cancer and had his last radiation treatment in September of 2021. He reports that he has continued to walk at home  as his primary exercise. Pt was previously seen at this clinic for PT/OT due to weakness/imbalance s/p CVA. Prior history from 09/21/19: Pt is a 87 year old male who presents with imbalance and difficulty with gait following a right frontal CVA with left hemiparesis on 05/22/19.  He has completed inpatient and home health PT, and now presents for OP PT.  He was discharged from inpatient rehab at a supervision level of assistance, ambulating with a RW.  His family notes that he is impulsive and will try to ambulate without his RW at home.  Family is currently providing 24/7 supervision.    Limitations House hold activities;Walking    How  long can you sit comfortably? no limitations    How long can you walk comfortably? he requires a RW    Patient Stated Goals Pt wants to be able to walk his granddaughter down the aisle at her wedding November 2022    Currently in Pain? No/denies    Pain Onset 1 to 4 weeks ago            INTERVENTIONS:   Therapeutic Exercises: Postural strengthening using Matrix cable system   Scap retract (at chest height) 12.5 lb 2 sets of 12 reps.   Scap retract (From above head) 12.5 lb 2 sets of 12 reps  Shoulder ext (from chest height) 12.5 lb 2 sets of 12 reps  Sitting in chair with shoulder flexed at 90 deg- holding onto 2Kg ball with both hands- [place] on leaning forward at trunk (for forward lean associated with sit to stand) then back in scap retract (for postural strengthening) 2 sets of 12 reps.   Added sit to stand from above position- x 10 reps (holding onto 2kg ball) and scap retract then sit back down.   At wall- Facing - Y position- performing low trap 2 sets of 12 reps. Patient rates as hard.   At wall- facing wall- arm raise (12 reps) - Patient rates as hard.   Education provided throughout session via VC/TC and demonstration to facilitate movement at target joints and correct muscle activation for all exercises performed.   Walking at support bar with 1 UE support- forward and then backward x 6 feet x 10 - focusing on balance, erect posture and taking a longer step as able. Patient with difficulty with retro gait today and required increased VC to walk backward safely.     Clinical Impression: Patient was well motivated and able to participate more today than in previous few weeks. He was able to work on posture and later walking with 1 UE support and performed well- able to return demo with provided Verbal and visual cues. He was fatigued at end of session and reported some soreness in upper back region. Pt will benefit from further skilled PT to improve BLE strength,  balance and functional mobility                      PT Education - 09/10/21 1348     Education Details Exercise technique    Person(s) Educated Patient    Methods Explanation;Demonstration;Tactile cues;Verbal cues    Comprehension Verbalized understanding;Returned demonstration;Verbal cues required;Need further instruction              PT Short Term Goals - 01/21/21 1438       PT SHORT TERM GOAL #1   Title Pt will be independent with HEP in order to improve strength and balance in order to decrease fall risk  and improve function at home.    Baseline 01/02/2021- Patient verbalize understanding of walking and seated exercises for home program. 01/21/21 patient has been more active and understands HEP.    Time 6    Period Weeks    Status Achieved    Target Date 01/10/21               PT Long Term Goals - 09/03/21 1405       PT LONG TERM GOAL #1   Title Pt will improve BERG by at least 3 points in order to demonstrate clinically significant improvement in balance.    Baseline 09/20/20: 32/56 (Previous discharge: 03/06/20: 46/56), 12/14: 32/56, 1/13: 31/56, 01/16/21 35/56, 02/06/2021=36/56. 02/18/2021= 37/56.03/20/2021- Deferred secondary to left knee aching- will attempt next visit. 05/06/2021= 39/56; 05/13/2021= 36/56 *patient having more left knee pain. 07/16/2021= 40/56    Time 12    Period Weeks    Status Achieved      PT LONG TERM GOAL #2   Title Pt will decrease TUG to below 14 seconds/decrease in order to demonstrate decreased fall risk.    Baseline 09/20/20: 19.2s, 12/14: 17.48, 1/13: 16.65 sec. 01/02/2021=15.75 sec using 4WW, 01/21/21  14.37 seconds. 02/06/2021= 14.92 sec using 4WW. 02/18/2021= 14.5 sec using 4WW. 03/20/2021- Deferred secondary to left knee aching- will attempt next visit. 05/06/2021= 16.32 sec avg using 4WW. 05/13/2021= 16.5 sec with 4WW *increased left knee pain. 06/11/21: 20.11 seconds (increased time to turn) using 4WW;  08/13/2021- Deferred testing  secondary to patient not feeling well (recovering from recent bout of COVID). Will test next 1-2 sessions. 09/03/2021= 16.83 sec using 4WW    Time 12    Period Weeks    Status On-going    Target Date 11/05/21      PT LONG TERM GOAL #3   Title Pt will decrease 5TSTS by at least 3 seconds in order to demonstrate clinically significant improvement in LE strength.    Baseline 09/20/20: 23.1s with minA+1 for strength and balance (Previous discharge 03/06/20: 16.1s), 12/14: 20 sec, 1/13: 22 sec.   01/02/2021= 16.05 sec with light UE support. 01/21/21 01/21/21 37.90 seconds without UE, with UE support 12.93 seconds: 02/06/2021= with 1 UE 17.93 sec. 02/18/2021= 21.33 sec without UE support. 05/06/2021= 15.3 sec with BUE Support and 21. 0 sec without UE support. 05/13/2021=15.0 sec with BUE support; 09/03/2021= 16 sec without UE support.    Time 12    Period Weeks    Status Achieved      PT LONG TERM GOAL #4   Title Pt will increase his self-selected 10MWT to >0.74 m/s in order to demonstrate improvement in gait speed and improved community ambulation    Baseline 09/20/20: self-selected: 14.4s = 0.69 m/s with rollator (Previous discharge 03/06/20: self-selected: 13.6s = 0.74 m/s), 12/14: 0.75 m/s, 1/13: 0.78 m/s. 01/02/2021= 0.85 m/s using 4WW, 01/16/21 with 4 WW .99 m/s. 02/06/2021= self selected = 0.93 m/s with 6QH    Time 4    Period Weeks    Status Achieved      PT LONG TERM GOAL #5   Title Patient will increase six minute walk test distance to >1000 for progression to community ambulator and improve gait ability    Baseline 01/10/20  620 ft with rollator, 01/21/21  965 ft; 02/06/2021= 910 feet with 4WW.  02/18/2021= 950 feet with 4WW. 05/06/2021= will test next visit. 05/13/2021- Deferred secondary to increased left knee pain. 06/11/21: 758ft with 4TM *knee pain throughout test. 07/11/2021= 900 feet  with use of 4WW.  08/13/2021- Deferred testing secondary to patient not feeling well (recovering from recent bout of COVID). Will  test next 1-2 sessions. 09/03/2021= 750 feet with use of 4WW (recovering from COVID and strained left hamstring)    Time 12    Period Weeks    Status On-going    Target Date 11/05/21      PT LONG TERM GOAL #6   Title Patient will demonstrate ability to walk with HHA +1 > 200 ft. without loss of balance on level surfaces to simulate walking his grandaughter down the aisle for her upcoming wedding in November 2022.    Baseline 05/13/2021- Patient currently ambulating short community distance with use of 4WW but desires to walk with his grandaughter with HHA for her wedding. 06/11/21: attempted 5 steps with HHA x1, deemed unsafe. 07/16/2021- Patient ambulated around 100 feet with HHA (arms interlocked) with PT to simulate walking daughter down aisle.  08/13/2021- Deferred testing secondary to patient not feeling well (recovering from recent bout of COVID). Will test next 1-2 sessions.    Time 12    Period Weeks    Status On-going    Target Date 11/05/21      PT LONG TERM GOAL #7   Title Pt will decrease 5TSTS  to 14 sec or less with BUE support on armrest in order to demonstrate clinically significant improvement in LE strength.    Baseline 05/13/2021=15.0 sec with BUE support; 26 sec without UE support. 06/11/21: 14.61 seconds with BUE support. 07/16/2021= 23 sec without UE support. 08/13/2021= 24. 2 sec with BUE support. 09/03/2021= 20.97 sec with B UE support - Limited by left hamstring pain.    Time 12    Period Weeks    Status On-going    Target Date 11/05/21                   Plan - 09/10/21 1352     Clinical Impression Statement Patient was well motivated and able to participate more today than in previous few weeks. He was able to work on posture and later walking with 1 UE support and performed well- able to return demo with provided Verbal and visual cues. He was fatigued at end of session and reported some soreness in upper back region. Pt will benefit from further skilled PT to  improve BLE strength, balance and functional mobility    Personal Factors and Comorbidities Age;Comorbidity 3+    Comorbidities HTN, CVA, cholecystitis with cholecystostomy drain    Examination-Activity Limitations Bend;Lift;Squat;Locomotion Level;Stairs;Stand    Examination-Participation Restrictions Community Activity;Driving;Laundry;Meal Prep;Shop    Stability/Clinical Decision Making Unstable/Unpredictable    Rehab Potential Fair    PT Frequency 2x / week    PT Duration 12 weeks    PT Treatment/Interventions ADLs/Self Care Home Management;Cryotherapy;Electrical Stimulation;Iontophoresis 4mg /ml Dexamethasone;Moist Heat;Traction;Ultrasound;DME Instruction;Gait training;Stair training;Therapeutic activities;Therapeutic exercise;Functional mobility training;Balance training;Neuromuscular re-education;Patient/family education;Manual techniques;Dry needling;Vestibular;Joint Manipulations;Spinal Manipulations;Aquatic Therapy    PT Next Visit Plan Continue to focus on LE strengthening and balance, dynamic balance tasks    PT Home Exercise Plan no changes    Consulted and Agree with Plan of Care Patient             Patient will benefit from skilled therapeutic intervention in order to improve the following deficits and impairments:  Abnormal gait, Decreased balance, Decreased mobility, Difficulty walking, Decreased knowledge of precautions, Decreased safety awareness, Decreased activity tolerance, Decreased strength, Decreased endurance  Visit Diagnosis: Abnormality of gait and mobility  Difficulty  in walking, not elsewhere classified  Muscle weakness (generalized)  Unsteadiness on feet     Problem List Patient Active Problem List   Diagnosis Date Noted   Localized osteoarthritis of knees, bilateral 06/13/2021   Moderate protein-calorie malnutrition ([place]) 03/11/2021   Hemiparesis affecting left side as late effect of cerebrovascular accident (CVA) ([place]) 03/11/2021   Aneurysm,  aorta, thoracic 12/21/2020   Calculus of bile duct without cholecystitis and without obstruction    Non-small cell carcinoma of lung, right ([place]) 09/07/2020   Mass of upper lobe of right lung 07/13/2020   Calculus of bile duct with cholecystitis without obstruction    Abnormal findings on imaging of biliary tract    Acute cholecystitis 05/02/2020   Chronic venous insufficiency 12/05/2019   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    CKD (chronic kidney disease), stage II    New onset atrial fibrillation ([place])    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   Goals of care, counseling/discussion    Palliative care by specialist    DNR (do not resuscitate) discussion    History of CVA (cerebrovascular accident) 05/22/2019   Persistent proteinuria 07/30/2018   Mobitz type 2 second degree heart block 07/13/2018   Chronic kidney disease, stage III (moderate) ([place]) 06/15/2018   Leg pain 05/06/2018   Lymphedema 05/06/2018   Coagulopathy ([place]) 07/07/2017   Lumbar spondylosis 03/10/2017   Elevated uric acid in blood 11/20/2016   BPH (benign prostatic hyperplasia) 05/29/2016   Peripheral vascular disease of lower extremity ([place]) 03/03/2016   Gallstone 12/31/2015   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   Atherosclerosis of aorta ([place]) 11/22/2015   Carotid artery narrowing 11/22/2015   Diverticulosis of colon 11/22/2015   Decreased creatinine clearance 11/22/2015   Arthritis, degenerative 11/22/2015   Lactose intolerance 11/22/2015   Basal cell carcinoma 11/22/2015   Essential hypertension 06/21/2015   Hyperlipemia 06/21/2015   GERD (gastroesophageal reflux disease) 06/21/2015   Calculus of kidney 11/18/2013    Lewis Moccasin, PT 09/11/2021, 8:55 AM  [place] MAIN [facility] SERVICES [address] [place], [place], [address] Phone: [phone]   Fax:  [phone]  Name: Jose Castro MRN:  [id number] Date of Birth: [birth year]

## 2021-09-12 ENCOUNTER — Other Ambulatory Visit: Payer: Self-pay

## 2021-09-12 ENCOUNTER — Ambulatory Visit: Payer: Medicare Other

## 2021-09-12 DIAGNOSIS — R2681 Unsteadiness on feet: Secondary | ICD-10-CM

## 2021-09-12 DIAGNOSIS — M6281 Muscle weakness (generalized): Secondary | ICD-10-CM

## 2021-09-12 DIAGNOSIS — R262 Difficulty in walking, not elsewhere classified: Secondary | ICD-10-CM

## 2021-09-12 DIAGNOSIS — R269 Unspecified abnormalities of gait and mobility: Secondary | ICD-10-CM | POA: Diagnosis not present

## 2021-09-12 NOTE — Therapy (Signed)
[place] MAIN [facility] SERVICES [address] [place], [place], [address] Phone: [phone]   Fax:  [phone]  Physical Therapy Treatment  Patient Details  Name: Jose Castro MRN: [id number] Date of Birth: 1926-10-17 Referring Provider (PT): Dr. Ancil Boozer   Encounter Date: 09/12/2021   PT End of Session - 09/12/21 1358     Visit Number 36    Number of Visits 70    Date for PT Re-Evaluation 11/05/21    Authorization Type eval: 09/20/20, Progress note 01/02/2021, PN on 05/02/8314, Recert = 1/76/1607-3/71/0626; PN on 4/202022; PN on 08/04/8545; Recert= 2/70/35009-02/06/1828; PN on 06/11/21; PN on 9/37/1696; Recert on 7/89/3810- 17/03/1024; PN on 09/03/2021    PT Start Time 1346    PT Stop Time 1428    PT Time Calculation (min) 42 min    Equipment Utilized During Treatment Gait belt    Activity Tolerance Patient tolerated treatment well    Behavior During Therapy WFL for tasks assessed/performed             Past Medical History:  Diagnosis Date   Allergy    Penicillin   Arthritis    Basal cell carcinoma    BPH (benign prostatic hyperplasia)    Chronic kidney disease    had a kidney stone which per family was a cyst that was removed   Dysrhythmia    GERD (gastroesophageal reflux disease)    History of kidney stones    Hyperlipidemia    Hypertension    Left-sided low back pain with left-sided sciatica    Lung mass    Splenic vein thrombosis    Stroke ([place]) 05/22/2019   Thrombosis 12/2015   mural  area and no notation of heart attack    Past Surgical History:  Procedure Laterality Date   APPENDECTOMY     CATARACT EXTRACTION, BILATERAL     CYSTOSCOPY WITH INSERTION OF UROLIFT     ENDOSCOPIC RETROGRADE CHOLANGIOPANCREATOGRAPHY (ERCP) WITH PROPOFOL N/A 05/11/2020   Procedure: ENDOSCOPIC RETROGRADE CHOLANGIOPANCREATOGRAPHY (ERCP) WITH PROPOFOL;  Surgeon: [name], MD;  Location: [facility] ENDOSCOPY;  Service: Endoscopy;  Laterality: N/A;    ERCP N/A 10/23/2020   Procedure: ENDOSCOPIC RETROGRADE CHOLANGIOPANCREATOGRAPHY (ERCP);  Surgeon: [name], MD;  Location: [facility] ENDOSCOPY;  Service: Endoscopy;  Laterality: N/A;   EYE SURGERY  08/2018   IR EXCHANGE BILIARY DRAIN  05/04/2020   KIDNEY STONE SURGERY     KNEE SURGERY     PACEMAKER INSERTION N/A 07/13/2018   Procedure: INSERTION PACEMAKER-DUEL CHAMBER INITIAL IMPLANT;  Surgeon: Isaias Cowman, MD;  Location: [facility] ORS;  Service: Cardiovascular;  Laterality: N/A;   SPINE SURGERY      There were no vitals filed for this visit.   Subjective Assessment - 09/12/21 1355     Subjective Patient reports no new complaints - states little sore from exercises last visit.    Patient is accompained by: Family member    Pertinent History Pt referred for deconditioning and gait instability. He has had a decline since he last finished therapy. Pt had a hospital admission from 05/02/20 to 05/14/20 for acute cholecystitis and is now s/p cholecystostomy drain placement. Drain is still in place and pt is waiting to hear when they plan to remove it. Pt discharged to SNF and then returned home. In addition pt underwent radiation therapy for a RUL lung mass/cancer and had his last radiation treatment in September of 2021. He reports that he has continued to walk at home as his primary  exercise. Pt was previously seen at this clinic for PT/OT due to weakness/imbalance s/p CVA. Prior history from 09/21/19: Pt is a 87 year old male who presents with imbalance and difficulty with gait following a right frontal CVA with left hemiparesis on 05/22/19.  He has completed inpatient and home health PT, and now presents for OP PT.  He was discharged from inpatient rehab at a supervision level of assistance, ambulating with a RW.  His family notes that he is impulsive and will try to ambulate without his RW at home.  Family is currently providing 24/7 supervision.    Limitations House hold activities;Walking    How long  can you sit comfortably? no limitations    How long can you walk comfortably? he requires a RW    Patient Stated Goals Pt wants to be able to walk his granddaughter down the aisle at her wedding November 2022    Currently in Pain? No/denies    Pain Onset 1 to 4 weeks ago              INTERVENTIONS:   Neuromuscular re-ed:   Static stand on incline (rockerboard on balance beam) - hold 30 sec x 3  then progress to eyes closed  Dynamic marching on blue airex pad  with min BUE Support. X 20 reps alt LE's.  Dynamic step forward and backward (heel strike to toe off) with swing leg resisted by Red TB x 15 reps each LE.    Therapeutic Exercises:    Bounce rainbow ball overhead against wall x 20 reps  Low trap Y exericse facing wall 2 sets of 10 reps.   Wall posture stretch while raising ball overhead x 20 reps.   Sit to stand without UE support (UE holding onto rainbow ball - outstretched to facilitate leaning forward)  x 12 (min assist with last 3 reps)   Education provided throughout session via VC/TC and demonstration to facilitate movement at target joints and correct muscle activation for all testing and exercises performed.   Clinical Impression: Patient performed well with balance and postural strengthening- able to complete reps and did not report any increased pain. He was compliant with following VC today and able to stand well all activities. He did have some difficulty with overhead activities exhibiting increased fatigue. Pt will benefit from further skilled PT to improve BLE strength, balance and functional mobility                          PT Education - 09/12/21 1357     Education Details Exercise technique    Person(s) Educated Patient    Methods Explanation;Demonstration;Tactile cues;Verbal cues    Comprehension Verbalized understanding;Returned demonstration;Verbal cues required;Need further instruction              PT Short Term  Goals - 01/21/21 1438       PT SHORT TERM GOAL #1   Title Pt will be independent with HEP in order to improve strength and balance in order to decrease fall risk and improve function at home.    Baseline 01/02/2021- Patient verbalize understanding of walking and seated exercises for home program. 01/21/21 patient has been more active and understands HEP.    Time 6    Period Weeks    Status Achieved    Target Date 01/10/21               PT Long Term Goals - 09/03/21 1405  PT LONG TERM GOAL #1   Title Pt will improve BERG by at least 3 points in order to demonstrate clinically significant improvement in balance.    Baseline 09/20/20: 32/56 (Previous discharge: 03/06/20: 46/56), 12/14: 32/56, 1/13: 31/56, 01/16/21 35/56, 02/06/2021=36/56. 02/18/2021= 37/56.03/20/2021- Deferred secondary to left knee aching- will attempt next visit. 05/06/2021= 39/56; 05/13/2021= 36/56 *patient having more left knee pain. 07/16/2021= 40/56    Time 12    Period Weeks    Status Achieved      PT LONG TERM GOAL #2   Title Pt will decrease TUG to below 14 seconds/decrease in order to demonstrate decreased fall risk.    Baseline 09/20/20: 19.2s, 12/14: 17.48, 1/13: 16.65 sec. 01/02/2021=15.75 sec using 4WW, 01/21/21  14.37 seconds. 02/06/2021= 14.92 sec using 4WW. 02/18/2021= 14.5 sec using 4WW. 03/20/2021- Deferred secondary to left knee aching- will attempt next visit. 05/06/2021= 16.32 sec avg using 4WW. 05/13/2021= 16.5 sec with 4WW *increased left knee pain. 06/11/21: 20.11 seconds (increased time to turn) using 4WW;  08/13/2021- Deferred testing secondary to patient not feeling well (recovering from recent bout of COVID). Will test next 1-2 sessions. 09/03/2021= 16.83 sec using 4WW    Time 12    Period Weeks    Status On-going    Target Date 11/05/21      PT LONG TERM GOAL #3   Title Pt will decrease 5TSTS by at least 3 seconds in order to demonstrate clinically significant improvement in LE strength.    Baseline  09/20/20: 23.1s with minA+1 for strength and balance (Previous discharge 03/06/20: 16.1s), 12/14: 20 sec, 1/13: 22 sec.   01/02/2021= 16.05 sec with light UE support. 01/21/21 01/21/21 37.90 seconds without UE, with UE support 12.93 seconds: 02/06/2021= with 1 UE 17.93 sec. 02/18/2021= 21.33 sec without UE support. 05/06/2021= 15.3 sec with BUE Support and 21. 0 sec without UE support. 05/13/2021=15.0 sec with BUE support; 09/03/2021= 16 sec without UE support.    Time 12    Period Weeks    Status Achieved      PT LONG TERM GOAL #4   Title Pt will increase his self-selected 10MWT to >0.74 m/s in order to demonstrate improvement in gait speed and improved community ambulation    Baseline 09/20/20: self-selected: 14.4s = 0.69 m/s with rollator (Previous discharge 03/06/20: self-selected: 13.6s = 0.74 m/s), 12/14: 0.75 m/s, 1/13: 0.78 m/s. 01/02/2021= 0.85 m/s using 4WW, 01/16/21 with 4 WW .99 m/s. 02/06/2021= self selected = 0.93 m/s with 0YD    Time 4    Period Weeks    Status Achieved      PT LONG TERM GOAL #5   Title Patient will increase six minute walk test distance to >1000 for progression to community ambulator and improve gait ability    Baseline 01/10/20  620 ft with rollator, 01/21/21  965 ft; 02/06/2021= 910 feet with 4WW.  02/18/2021= 950 feet with 4WW. 05/06/2021= will test next visit. 05/13/2021- Deferred secondary to increased left knee pain. 06/11/21: 765ft with 7AJ *knee pain throughout test. 07/11/2021= 900 feet with use of 4WW.  08/13/2021- Deferred testing secondary to patient not feeling well (recovering from recent bout of COVID). Will test next 1-2 sessions. 09/03/2021= 750 feet with use of 4WW (recovering from COVID and strained left hamstring)    Time 12    Period Weeks    Status On-going    Target Date 11/05/21      PT LONG TERM GOAL #6   Title Patient will demonstrate ability to  walk with HHA +1 > 200 ft. without loss of balance on level surfaces to simulate walking his grandaughter down the aisle  for her upcoming wedding in November 2022.    Baseline 05/13/2021- Patient currently ambulating short community distance with use of 4WW but desires to walk with his grandaughter with HHA for her wedding. 06/11/21: attempted 5 steps with HHA x1, deemed unsafe. 07/16/2021- Patient ambulated around 100 feet with HHA (arms interlocked) with PT to simulate walking daughter down aisle.  08/13/2021- Deferred testing secondary to patient not feeling well (recovering from recent bout of COVID). Will test next 1-2 sessions.    Time 12    Period Weeks    Status On-going    Target Date 11/05/21      PT LONG TERM GOAL #7   Title Pt will decrease 5TSTS  to 14 sec or less with BUE support on armrest in order to demonstrate clinically significant improvement in LE strength.    Baseline 05/13/2021=15.0 sec with BUE support; 26 sec without UE support. 06/11/21: 14.61 seconds with BUE support. 07/16/2021= 23 sec without UE support. 08/13/2021= 24. 2 sec with BUE support. 09/03/2021= 20.97 sec with B UE support - Limited by left hamstring pain.    Time 12    Period Weeks    Status On-going    Target Date 11/05/21                   Plan - 09/12/21 1456     Clinical Impression Statement Patient performed well with balance and postural strengthening- able to complete reps and did not report any increased pain. He was compliant with following VC today and able to stand well all activities. He did have some difficulty with overhead activities exhibiting increased fatigue. Pt will benefit from further skilled PT to improve BLE strength, balance and functional mobility    Personal Factors and Comorbidities Age;Comorbidity 3+    Comorbidities HTN, CVA, cholecystitis with cholecystostomy drain    Examination-Activity Limitations Bend;Lift;Squat;Locomotion Level;Stairs;Stand    Examination-Participation Restrictions Community Activity;Driving;Laundry;Meal Prep;Shop    Stability/Clinical Decision Making  Unstable/Unpredictable    Rehab Potential Fair    PT Frequency 2x / week    PT Duration 12 weeks    PT Treatment/Interventions ADLs/Self Care Home Management;Cryotherapy;Electrical Stimulation;Iontophoresis 4mg /ml Dexamethasone;Moist Heat;Traction;Ultrasound;DME Instruction;Gait training;Stair training;Therapeutic activities;Therapeutic exercise;Functional mobility training;Balance training;Neuromuscular re-education;Patient/family education;Manual techniques;Dry needling;Vestibular;Joint Manipulations;Spinal Manipulations;Aquatic Therapy    PT Next Visit Plan Continue to focus on LE strengthening and balance, dynamic balance tasks    PT Home Exercise Plan no changes    Consulted and Agree with Plan of Care Patient             Patient will benefit from skilled therapeutic intervention in order to improve the following deficits and impairments:  Abnormal gait, Decreased balance, Decreased mobility, Difficulty walking, Decreased knowledge of precautions, Decreased safety awareness, Decreased activity tolerance, Decreased strength, Decreased endurance  Visit Diagnosis: Abnormality of gait and mobility  Difficulty in walking, not elsewhere classified  Muscle weakness (generalized)  Unsteadiness on feet     Problem List Patient Active Problem List   Diagnosis Date Noted   Localized osteoarthritis of knees, bilateral 06/13/2021   Moderate protein-calorie malnutrition ([place]) 03/11/2021   Hemiparesis affecting left side as late effect of cerebrovascular accident (CVA) ([place]) 03/11/2021   Aneurysm, aorta, thoracic 12/21/2020   Calculus of bile duct without cholecystitis and without obstruction    Non-small cell carcinoma of lung, right ([place]) 09/07/2020   Mass of upper  lobe of right lung 07/13/2020   Calculus of bile duct with cholecystitis without obstruction    Abnormal findings on imaging of biliary tract    Acute cholecystitis 05/02/2020   Chronic venous insufficiency 12/05/2019    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    CKD (chronic kidney disease), stage II    New onset atrial fibrillation ([place])    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   Goals of care, counseling/discussion    Palliative care by specialist    DNR (do not resuscitate) discussion    History of CVA (cerebrovascular accident) 05/22/2019   Persistent proteinuria 07/30/2018   M[place]itz type 2 second degree heart block 07/13/2018   Chronic kidney disease, stage III (moderate) ([place]) 06/15/2018   Leg pain 05/06/2018   Lymphedema 05/06/2018   Coagulopathy ([place]) 07/07/2017   Lumbar spondylosis 03/10/2017   Elevated uric acid in blood 11/20/2016   BPH (benign prostatic hyperplasia) 05/29/2016   Peripheral vascular disease of lower extremity ([place]) 03/03/2016   Gallstone 12/31/2015   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   Atherosclerosis of aorta ([place]) 11/22/2015   Carotid artery narrowing 11/22/2015   Diverticulosis of colon 11/22/2015   Decreased creatinine clearance 11/22/2015   Arthritis, degenerative 11/22/2015   Lactose intolerance 11/22/2015   Basal cell carcinoma 11/22/2015   Essential hypertension 06/21/2015   Hyperlipemia 06/21/2015   GERD (gastroesophageal reflux disease) 06/21/2015   Calculus of kidney 11/18/2013    Lewis Moccasin, PT 09/12/2021, 3:00 PM  [place], [place], [address] Phone: [phone]   Fax:  [phone]  Name: LANE ELAND MRN: [id number] Date of Birth: 1926/09/29

## 2021-09-16 ENCOUNTER — Ambulatory Visit: Payer: Medicare Other | Admitting: Family Medicine

## 2021-09-17 ENCOUNTER — Other Ambulatory Visit: Payer: Self-pay

## 2021-09-17 ENCOUNTER — Ambulatory Visit: Payer: Medicare Other

## 2021-09-17 DIAGNOSIS — R269 Unspecified abnormalities of gait and mobility: Secondary | ICD-10-CM | POA: Diagnosis not present

## 2021-09-17 DIAGNOSIS — R262 Difficulty in walking, not elsewhere classified: Secondary | ICD-10-CM

## 2021-09-17 DIAGNOSIS — M6281 Muscle weakness (generalized): Secondary | ICD-10-CM

## 2021-09-17 DIAGNOSIS — R2681 Unsteadiness on feet: Secondary | ICD-10-CM

## 2021-09-17 NOTE — Therapy (Signed)
[place] MAIN [facility] SERVICES [address] [place], [place], [address] Phone: [phone]   Fax:  [phone]  Physical Therapy Treatment  Patient Details  Name: Jose Castro MRN: [id number] Date of Birth: [birth year] Referring Provider (PT): Dr. Ancil Boozer   Encounter Date: 09/17/2021   PT End of Session - 09/17/21 0850     Visit Number 32    Number of Visits 42    Date for PT Re-Evaluation 11/05/21    Authorization Type eval: 09/20/20, Progress note 01/02/2021, PN on 06/02/4286, Recert = 6/81/1572-05/20/3558; PN on 4/202022; PN on 06/03/1637; Recert= 4/53/64680-01/31/1223; PN on 06/11/21; PN on 07/25/36; Recert on 0/48/8891- 69/03/5037; PN on 09/03/2021    PT Start Time 1346    PT Stop Time 1429    PT Time Calculation (min) 43 min    Equipment Utilized During Treatment Gait belt    Activity Tolerance Patient tolerated treatment well    Behavior During Therapy WFL for tasks assessed/performed             Past Medical History:  Diagnosis Date   Allergy    Penicillin   Arthritis    Basal cell carcinoma    BPH (benign prostatic hyperplasia)    Chronic kidney disease    had a kidney stone which per family was a cyst that was removed   Dysrhythmia    GERD (gastroesophageal reflux disease)    History of kidney stones    Hyperlipidemia    Hypertension    Left-sided low back pain with left-sided sciatica    Lung mass    Splenic vein thrombosis    Stroke ([place]) 05/22/2019   Thrombosis 12/2015   mural  area and no notation of heart attack    Past Surgical History:  Procedure Laterality Date   APPENDECTOMY     CATARACT EXTRACTION, BILATERAL     CYSTOSCOPY WITH INSERTION OF UROLIFT     ENDOSCOPIC RETROGRADE CHOLANGIOPANCREATOGRAPHY (ERCP) WITH PROPOFOL N/A 05/11/2020   Procedure: ENDOSCOPIC RETROGRADE CHOLANGIOPANCREATOGRAPHY (ERCP) WITH PROPOFOL;  Surgeon: [name], MD;  Location: [facility] ENDOSCOPY;  Service: Endoscopy;  Laterality: N/A;    ERCP N/A 10/23/2020   Procedure: ENDOSCOPIC RETROGRADE CHOLANGIOPANCREATOGRAPHY (ERCP);  Surgeon: [name], MD;  Location: [facility] ENDOSCOPY;  Service: Endoscopy;  Laterality: N/A;   EYE SURGERY  08/2018   IR EXCHANGE BILIARY DRAIN  05/04/2020   KIDNEY STONE SURGERY     KNEE SURGERY     PACEMAKER INSERTION N/A 07/13/2018   Procedure: INSERTION PACEMAKER-DUEL CHAMBER INITIAL IMPLANT;  Surgeon: Isaias Cowman, MD;  Location: [facility] ORS;  Service: Cardiovascular;  Laterality: N/A;   SPINE SURGERY      There were no vitals filed for this visit.   INTERVENTIONS:   Neuromuscular re-ed- Throwing velcro ball (x 10 shots) onto target board while standing as erect as possible  on varying surfaces including:   Firm surface with feet apart Firm surface with feet together  Blue airex pad with feet apart Blue airex pad with feet together Blue airex pad with feet staggered *CGA yet no loss of balance    Therapeutic exercises:   Resistive seated Hip march with Blue theraband x 12 reps  Resistive ham curls with Blue Theraband x 12 reps  Resistive Hip abd with Blue theraband x 12 reps  Sit to stand x 10 reps   Walking with 1 person holding his left arm (elbow interlocked) to simulate walking grandaughter down the aisle- He presented with increased antalgia and decreased  step length. He was unable to improve with distance- stopped at 100 feet due to fatigue and some left knee discomfort.   Education provided throughout session via VC/TC and demonstration to facilitate movement at target joints and correct muscle activation for all testing and exercises performed.    Clinical Impression: Patient performed well with coordination activity including standing endurance, LE strength, balance and coordination- able throw velcro ball to target bullseye without significant difficulty other than fatigue and no significant LOB. He did struggle more with walking without his walker and obvious discomfort  in his left knee. He will benefit from further skilled PT to improve BLE strength, balance and functional mobility                           PT Education - 09/18/21 0849     Education Details Exercise technique    Person(s) Educated Patient    Methods Explanation;Demonstration;Tactile cues;Verbal cues    Comprehension Verbalized understanding;Returned demonstration;Verbal cues required;Need further instruction;Tactile cues required              PT Short Term Goals - 01/21/21 1438       PT SHORT TERM GOAL #1   Title Pt will be independent with HEP in order to improve strength and balance in order to decrease fall risk and improve function at home.    Baseline 01/02/2021- Patient verbalize understanding of walking and seated exercises for home program. 01/21/21 patient has been more active and understands HEP.    Time 6    Period Weeks    Status Achieved    Target Date 01/10/21               PT Long Term Goals - 09/03/21 1405       PT LONG TERM GOAL #1   Title Pt will improve BERG by at least 3 points in order to demonstrate clinically significant improvement in balance.    Baseline 09/20/20: 32/56 (Previous discharge: 03/06/20: 46/56), 12/14: 32/56, 1/13: 31/56, 01/16/21 35/56, 02/06/2021=36/56. 02/18/2021= 37/56.03/20/2021- Deferred secondary to left knee aching- will attempt next visit. 05/06/2021= 39/56; 05/13/2021= 36/56 *patient having more left knee pain. 07/16/2021= 40/56    Time 12    Period Weeks    Status Achieved      PT LONG TERM GOAL #2   Title Pt will decrease TUG to below 14 seconds/decrease in order to demonstrate decreased fall risk.    Baseline 09/20/20: 19.2s, 12/14: 17.48, 1/13: 16.65 sec. 01/02/2021=15.75 sec using 4WW, 01/21/21  14.37 seconds. 02/06/2021= 14.92 sec using 4WW. 02/18/2021= 14.5 sec using 4WW. 03/20/2021- Deferred secondary to left knee aching- will attempt next visit. 05/06/2021= 16.32 sec avg using 4WW. 05/13/2021= 16.5 sec with 4WW  *increased left knee pain. 06/11/21: 20.11 seconds (increased time to turn) using 4WW;  08/13/2021- Deferred testing secondary to patient not feeling well (recovering from recent bout of COVID). Will test next 1-2 sessions. 09/03/2021= 16.83 sec using 4WW    Time 12    Period Weeks    Status On-going    Target Date 11/05/21      PT LONG TERM GOAL #3   Title Pt will decrease 5TSTS by at least 3 seconds in order to demonstrate clinically significant improvement in LE strength.    Baseline 09/20/20: 23.1s with minA+1 for strength and balance (Previous discharge 03/06/20: 16.1s), 12/14: 20 sec, 1/13: 22 sec.   01/02/2021= 16.05 sec with light UE support. 01/21/21 01/21/21 37.90 seconds  without UE, with UE support 12.93 seconds: 02/06/2021= with 1 UE 17.93 sec. 02/18/2021= 21.33 sec without UE support. 05/06/2021= 15.3 sec with BUE Support and 21. 0 sec without UE support. 05/13/2021=15.0 sec with BUE support; 09/03/2021= 16 sec without UE support.    Time 12    Period Weeks    Status Achieved      PT LONG TERM GOAL #4   Title Pt will increase his self-selected 10MWT to >0.74 m/s in order to demonstrate improvement in gait speed and improved community ambulation    Baseline 09/20/20: self-selected: 14.4s = 0.69 m/s with rollator (Previous discharge 03/06/20: self-selected: 13.6s = 0.74 m/s), 12/14: 0.75 m/s, 1/13: 0.78 m/s. 01/02/2021= 0.85 m/s using 4WW, 01/16/21 with 4 WW .99 m/s. 02/06/2021= self selected = 0.93 m/s with 9FX    Time 4    Period Weeks    Status Achieved      PT LONG TERM GOAL #5   Title Patient will increase six minute walk test distance to >1000 for progression to community ambulator and improve gait ability    Baseline 01/10/20  620 ft with rollator, 01/21/21  965 ft; 02/06/2021= 910 feet with 4WW.  02/18/2021= 950 feet with 4WW. 05/06/2021= will test next visit. 05/13/2021- Deferred secondary to increased left knee pain. 06/11/21: 745ft with 9KW *knee pain throughout test. 07/11/2021= 900 feet with use of  4WW.  08/13/2021- Deferred testing secondary to patient not feeling well (recovering from recent bout of COVID). Will test next 1-2 sessions. 09/03/2021= 750 feet with use of 4WW (recovering from COVID and strained left hamstring)    Time 12    Period Weeks    Status On-going    Target Date 11/05/21      PT LONG TERM GOAL #6   Title Patient will demonstrate ability to walk with HHA +1 > 200 ft. without loss of balance on level surfaces to simulate walking his grandaughter down the aisle for her upcoming wedding in November 2022.    Baseline 05/13/2021- Patient currently ambulating short community distance with use of 4WW but desires to walk with his grandaughter with HHA for her wedding. 06/11/21: attempted 5 steps with HHA x1, deemed unsafe. 07/16/2021- Patient ambulated around 100 feet with HHA (arms interlocked) with PT to simulate walking daughter down aisle.  08/13/2021- Deferred testing secondary to patient not feeling well (recovering from recent bout of COVID). Will test next 1-2 sessions.    Time 12    Period Weeks    Status On-going    Target Date 11/05/21      PT LONG TERM GOAL #7   Title Pt will decrease 5TSTS  to 14 sec or less with BUE support on armrest in order to demonstrate clinically significant improvement in LE strength.    Baseline 05/13/2021=15.0 sec with BUE support; 26 sec without UE support. 06/11/21: 14.61 seconds with BUE support. 07/16/2021= 23 sec without UE support. 08/13/2021= 24. 2 sec with BUE support. 09/03/2021= 20.97 sec with B UE support - Limited by left hamstring pain.    Time 12    Period Weeks    Status On-going    Target Date 11/05/21                   Plan - 09/18/21 0850     Clinical Impression Statement Patient performed well with coordination activity including standing endurance, LE strength, balance and coordination- able throw velcro ball to target bullseye without significant difficulty other than fatigue and no  significant LOB. He did  struggle more with walking without his walker and obvious discomfort in his left knee. He will benefit from further skilled PT to improve BLE strength, balance and functional mobility    Personal Factors and Comorbidities Age;Comorbidity 3+    Comorbidities HTN, CVA, cholecystitis with cholecystostomy drain    Examination-Activity Limitations Bend;Lift;Squat;Locomotion Level;Stairs;Stand    Examination-Participation Restrictions Community Activity;Driving;Laundry;Meal Prep;Shop    Stability/Clinical Decision Making Unstable/Unpredictable    Rehab Potential Fair    PT Frequency 2x / week    PT Duration 12 weeks    PT Treatment/Interventions ADLs/Self Care Home Management;Cryotherapy;Electrical Stimulation;Iontophoresis 4mg /ml Dexamethasone;Moist Heat;Traction;Ultrasound;DME Instruction;Gait training;Stair training;Therapeutic activities;Therapeutic exercise;Functional mobility training;Balance training;Neuromuscular re-education;Patient/family education;Manual techniques;Dry needling;Vestibular;Joint Manipulations;Spinal Manipulations;Aquatic Therapy    PT Next Visit Plan Continue to focus on LE strengthening and balance, dynamic balance tasks    PT Home Exercise Plan no changes    Consulted and Agree with Plan of Care Patient             Patient will benefit from skilled therapeutic intervention in order to improve the following deficits and impairments:  Abnormal gait, Decreased balance, Decreased mobility, Difficulty walking, Decreased knowledge of precautions, Decreased safety awareness, Decreased activity tolerance, Decreased strength, Decreased endurance  Visit Diagnosis: Abnormality of gait and mobility  Difficulty in walking, not elsewhere classified  Muscle weakness (generalized)  Unsteadiness on feet     Problem List Patient Active Problem List   Diagnosis Date Noted   Localized osteoarthritis of knees, bilateral 06/13/2021   Moderate protein-calorie malnutrition ([place])  03/11/2021   Hemiparesis affecting left side as late effect of cerebrovascular accident (CVA) ([place]) 03/11/2021   Aneurysm, aorta, thoracic 12/21/2020   Calculus of bile duct without cholecystitis and without obstruction    Non-small cell carcinoma of lung, right ([place]) 09/07/2020   Mass of upper lobe of right lung 07/13/2020   Calculus of bile duct with cholecystitis without obstruction    Abnormal findings on imaging of biliary tract    Acute cholecystitis 05/02/2020   Chronic venous insufficiency 12/05/2019   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    CKD (chronic kidney disease), stage II    New onset atrial fibrillation ([facility])    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   Goals of care, counseling/discussion    Palliative care by specialist    DNR (do not resuscitate) discussion    History of CVA (cerebrovascular accident) 05/22/2019   Persistent proteinuria 07/30/2018   Mobitz type 2 second degree heart block 07/13/2018   Chronic kidney disease, stage III (moderate) ([place]) 06/15/2018   Leg pain 05/06/2018   Lymphedema 05/06/2018   Coagulopathy ([place]) 07/07/2017   Lumbar spondylosis 03/10/2017   Elevated uric acid in blood 11/20/2016   BPH (benign prostatic hyperplasia) 05/29/2016   Peripheral vascular disease of lower extremity ([place]) 03/03/2016   Gallstone 12/31/2015   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   Atherosclerosis of aorta ([place]) 11/22/2015   Carotid artery narrowing 11/22/2015   Diverticulosis of colon 11/22/2015   Decreased creatinine clearance 11/22/2015   Arthritis, degenerative 11/22/2015   Lactose intolerance 11/22/2015   Basal cell carcinoma 11/22/2015   Essential hypertension 06/21/2015   Hyperlipemia 06/21/2015   GERD (gastroesophageal reflux disease) 06/21/2015   Calculus of kidney 11/18/2013    Lewis Moccasin, PT 09/18/2021, 10:01 AM   [place], [place], [address] Phone: [phone]   Fax:  [phone]  Name: JAKEOB TULLIS MRN: [id number] Date of Birth: 02/27/1926

## 2021-09-19 ENCOUNTER — Other Ambulatory Visit: Payer: Self-pay

## 2021-09-19 ENCOUNTER — Ambulatory Visit: Payer: Medicare Other

## 2021-09-19 DIAGNOSIS — R790 Abnormal level of blood mineral: Secondary | ICD-10-CM

## 2021-09-19 DIAGNOSIS — R262 Difficulty in walking, not elsewhere classified: Secondary | ICD-10-CM

## 2021-09-19 DIAGNOSIS — R269 Unspecified abnormalities of gait and mobility: Secondary | ICD-10-CM

## 2021-09-19 DIAGNOSIS — E876 Hypokalemia: Secondary | ICD-10-CM

## 2021-09-19 DIAGNOSIS — N1831 Chronic kidney disease, stage 3a: Secondary | ICD-10-CM

## 2021-09-19 DIAGNOSIS — M6281 Muscle weakness (generalized): Secondary | ICD-10-CM

## 2021-09-19 DIAGNOSIS — R2681 Unsteadiness on feet: Secondary | ICD-10-CM

## 2021-09-19 NOTE — Therapy (Signed)
[place] [facility] [address] [place], [place], [address] Phone: [phone]   Fax:  [phone]  Physical Therapy Treatment  Patient Details  Name: Jose Castro MRN: [id number] Date of Birth: Mar 27, 1926 Referring Provider (PT): Dr. Ancil Boozer   Encounter Date: 09/19/2021   PT End of Session - 09/19/21 1351     Visit Number 58    Number of Visits 88    Date for PT Re-Evaluation 11/05/21    Authorization Type eval: 09/20/20, Progress note 01/02/2021, PN on 05/05/353, Recert = 6/56/8127-04/16/16; PN on 4/202022; PN on 03/10/4495; Recert= 7/59/16384-05/06/5992; PN on 06/11/21; PN on 5/70/1779; Recert on 3/90/3009- 23/01/75; PN on 09/03/2021    PT Start Time 1345    PT Stop Time 1428    PT Time Calculation (min) 43 min    Equipment Utilized During Treatment Gait belt    Activity Tolerance Patient tolerated treatment well    Behavior During Therapy WFL for tasks assessed/performed             Past Medical History:  Diagnosis Date   Allergy    Penicillin   Arthritis    Basal cell carcinoma    BPH (benign prostatic hyperplasia)    Chronic kidney disease    had a kidney stone which per family was a cyst that was removed   Dysrhythmia    GERD (gastroesophageal reflux disease)    History of kidney stones    Hyperlipidemia    Hypertension    Left-sided low back pain with left-sided sciatica    Lung mass    Splenic vein thrombosis    Stroke ([place]) 05/22/2019   Thrombosis 12/2015   mural  area and no notation of heart attack    Past Surgical History:  Procedure Laterality Date   APPENDECTOMY     CATARACT EXTRACTION, BILATERAL     CYSTOSCOPY WITH INSERTION OF UROLIFT     ENDOSCOPIC RETROGRADE CHOLANGIOPANCREATOGRAPHY (ERCP) WITH PROPOFOL N/A 05/11/2020   Procedure: ENDOSCOPIC RETROGRADE CHOLANGIOPANCREATOGRAPHY (ERCP) WITH PROPOFOL;  Surgeon: [name], MD;  Location: [facility] ENDOSCOPY;  Service: Endoscopy;  Laterality: N/A;    ERCP N/A 10/23/2020   Procedure: ENDOSCOPIC RETROGRADE CHOLANGIOPANCREATOGRAPHY (ERCP);  Surgeon: [name], MD;  Location: [facility] ENDOSCOPY;  Service: Endoscopy;  Laterality: N/A;   EYE SURGERY  08/2018   IR EXCHANGE BILIARY DRAIN  05/04/2020   KIDNEY STONE SURGERY     KNEE SURGERY     PACEMAKER INSERTION N/A 07/13/2018   Procedure: INSERTION PACEMAKER-DUEL CHAMBER INITIAL IMPLANT;  Surgeon: Isaias Cowman, MD;  Location: [facility] ORS;  Service: Cardiovascular;  Laterality: N/A;   SPINE SURGERY      There were no vitals filed for this visit.   Subjective Assessment - 09/19/21 1350     Subjective Patient reports doing better overall today- Reports he brought in his bulkier knee brace for todays session to see if he could walk better.    Patient is accompained by: Family member    Pertinent History Pt referred for deconditioning and gait instability. He has had a decline since he last finished therapy. Pt had a hospital admission from 05/02/20 to 05/14/20 for acute cholecystitis and is now s/p cholecystostomy drain placement. Drain is still in place and pt is waiting to hear when they plan to remove it. Pt discharged to SNF and then returned home. In addition pt underwent radiation therapy for a RUL lung mass/cancer and had his last radiation treatment in September of 2021. He reports  that he has continued to walk at home as his primary exercise. Pt was previously seen at this clinic for PT/OT due to weakness/imbalance s/p CVA. Prior history from 09/21/19: Pt is a 87 year old male who presents with imbalance and difficulty with gait following a right frontal CVA with left hemiparesis on 05/22/19.  He has completed inpatient and home health PT, and now presents for OP PT.  He was discharged from inpatient rehab at a supervision level of assistance, ambulating with a RW.  His family notes that he is impulsive and will try to ambulate without his RW at home.  Family is currently providing 24/7 supervision.     Limitations House hold activities;Walking    How long can you sit comfortably? no limitations    How long can you walk comfortably? he requires a RW    Patient Stated Goals Pt wants to be able to walk his granddaughter down the aisle at her wedding November 2022    Currently in Pain? Yes    Pain Score 4     Pain Location Knee    Pain Orientation Left;Anterior    Pain Descriptors / Indicators Aching    Pain Type Chronic pain    Pain Onset More than a month ago    Pain Frequency Intermittent    Aggravating Factors  Transfers, prolonged sitting    Pain Relieving Factors Rest, use of knee brace    Effect of Pain on Daily Activities Limited standing endurance/walking               INTERVENTIONS:   Therapeutic Exercises:   Standing Hip abd using RTB 2 sets of 15 reps- VC to slow down for optimal muscle control and to focus on weight bearing and   Side stepping up and over RTB (3" from floor) and another side step over orange hurdle in // Bars x 10 trips down and back. No complaint of left LE pain. VC for step height to clear obstacles.   Multiple sit to stand throughout session- VC to scoot out to edge of seat and to look ahead.   Forward steps up and over RTB (3" from floor) and another side steps over orange hurdle in // Bars. VC for step length and height to clear obstacle- Patient reliant on BUE support yet no report of left knee pain during activity.   Heel to toe gait sequencing with RTB BLE  2 sets x 15 reps each. Patient with difficulty with Right LE - reporting some soreness in left knee but not above initial pain report of 4/10.    GAIT TRAINING:  Gait (Right arm interlocked with PT arm) to simulate preparing Patient for upcoming wedding in November as he is supposed to walk his grandaughter (the bride) down the aisle.  Patient exhibited increased antalgia on left knee with just HHA support- Recommended trial of cane on opposite side and patient agreed. He then walked   another 120 feet exhibited improved gait sequencing and less antalgic gait.   Forward/Retro gait in // bars - using 1UE for support focusing on increasing step length as pain allows and also hip ext with backward walking.   Clinical Impression: Patient motivated and reports feeling better- able to participate in more weight bearing activities. He again struggled with walking with just one support and did improve with use of HHA on one side and cane on opposite. He did demo improved weight bearing today overall versus recent visits. He will benefit from further skilled  PT to improve BLE strength, balance and functional mobility                       PT Education - 09/20/21 0821     Education Details Exercise technique    Person(s) Educated Patient    Methods Explanation;Demonstration;Tactile cues;Verbal cues    Comprehension Verbalized understanding;Returned demonstration;Verbal cues required;Need further instruction              PT Short Term Goals - 01/21/21 1438       PT SHORT TERM GOAL #1   Title Pt will be independent with HEP in order to improve strength and balance in order to decrease fall risk and improve function at home.    Baseline 01/02/2021- Patient verbalize understanding of walking and seated exercises for home program. 01/21/21 patient has been more active and understands HEP.    Time 6    Period Weeks    Status Achieved    Target Date 01/10/21               PT Long Term Goals - 09/03/21 1405       PT LONG TERM GOAL #1   Title Pt will improve BERG by at least 3 points in order to demonstrate clinically significant improvement in balance.    Baseline 09/20/20: 32/56 (Previous discharge: 03/06/20: 46/56), 12/14: 32/56, 1/13: 31/56, 01/16/21 35/56, 02/06/2021=36/56. 02/18/2021= 37/56.03/20/2021- Deferred secondary to left knee aching- will attempt next visit. 05/06/2021= 39/56; 05/13/2021= 36/56 *patient having more left knee pain. 07/16/2021= 40/56     Time 12    Period Weeks    Status Achieved      PT LONG TERM GOAL #2   Title Pt will decrease TUG to below 14 seconds/decrease in order to demonstrate decreased fall risk.    Baseline 09/20/20: 19.2s, 12/14: 17.48, 1/13: 16.65 sec. 01/02/2021=15.75 sec using 4WW, 01/21/21  14.37 seconds. 02/06/2021= 14.92 sec using 4WW. 02/18/2021= 14.5 sec using 4WW. 03/20/2021- Deferred secondary to left knee aching- will attempt next visit. 05/06/2021= 16.32 sec avg using 4WW. 05/13/2021= 16.5 sec with 4WW *increased left knee pain. 06/11/21: 20.11 seconds (increased time to turn) using 4WW;  08/13/2021- Deferred testing secondary to patient not feeling well (recovering from recent bout of COVID). Will test next 1-2 sessions. 09/03/2021= 16.83 sec using 4WW    Time 12    Period Weeks    Status On-going    Target Date 11/05/21      PT LONG TERM GOAL #3   Title Pt will decrease 5TSTS by at least 3 seconds in order to demonstrate clinically significant improvement in LE strength.    Baseline 09/20/20: 23.1s with minA+1 for strength and balance (Previous discharge 03/06/20: 16.1s), 12/14: 20 sec, 1/13: 22 sec.   01/02/2021= 16.05 sec with light UE support. 01/21/21 01/21/21 37.90 seconds without UE, with UE support 12.93 seconds: 02/06/2021= with 1 UE 17.93 sec. 02/18/2021= 21.33 sec without UE support. 05/06/2021= 15.3 sec with BUE Support and 21. 0 sec without UE support. 05/13/2021=15.0 sec with BUE support; 09/03/2021= 16 sec without UE support.    Time 12    Period Weeks    Status Achieved      PT LONG TERM GOAL #4   Title Pt will increase his self-selected 10MWT to >0.74 m/s in order to demonstrate improvement in gait speed and improved community ambulation    Baseline 09/20/20: self-selected: 14.4s = 0.69 m/s with rollator (Previous discharge 03/06/20: self-selected: 13.6s = 0.74 m/s),  12/14: 0.75 m/s, 1/13: 0.78 m/s. 01/02/2021= 0.85 m/s using 4WW, 01/16/21 with 4 WW .99 m/s. 02/06/2021= self selected = 0.93 m/s with 1DE    Time 4     Period Weeks    Status Achieved      PT LONG TERM GOAL #5   Title Patient will increase six minute walk test distance to >1000 for progression to community ambulator and improve gait ability    Baseline 01/10/20  620 ft with rollator, 01/21/21  965 ft; 02/06/2021= 910 feet with 4WW.  02/18/2021= 950 feet with 4WW. 05/06/2021= will test next visit. 05/13/2021- Deferred secondary to increased left knee pain. 06/11/21: 731ft with 0CX *knee pain throughout test. 07/11/2021= 900 feet with use of 4WW.  08/13/2021- Deferred testing secondary to patient not feeling well (recovering from recent bout of COVID). Will test next 1-2 sessions. 09/03/2021= 750 feet with use of 4WW (recovering from COVID and strained left hamstring)    Time 12    Period Weeks    Status On-going    Target Date 11/05/21      PT LONG TERM GOAL #6   Title Patient will demonstrate ability to walk with HHA +1 > 200 ft. without loss of balance on level surfaces to simulate walking his grandaughter down the aisle for her upcoming wedding in November 2022.    Baseline 05/13/2021- Patient currently ambulating short community distance with use of 4WW but desires to walk with his grandaughter with HHA for her wedding. 06/11/21: attempted 5 steps with HHA x1, deemed unsafe. 07/16/2021- Patient ambulated around 100 feet with HHA (arms interlocked) with PT to simulate walking daughter down aisle.  08/13/2021- Deferred testing secondary to patient not feeling well (recovering from recent bout of COVID). Will test next 1-2 sessions.    Time 12    Period Weeks    Status On-going    Target Date 11/05/21      PT LONG TERM GOAL #7   Title Pt will decrease 5TSTS  to 14 sec or less with BUE support on armrest in order to demonstrate clinically significant improvement in LE strength.    Baseline 05/13/2021=15.0 sec with BUE support; 26 sec without UE support. 06/11/21: 14.61 seconds with BUE support. 07/16/2021= 23 sec without UE support. 08/13/2021= 24. 2 sec with  BUE support. 09/03/2021= 20.97 sec with B UE support - Limited by left hamstring pain.    Time 12    Period Weeks    Status On-going    Target Date 11/05/21                   Plan - 09/19/21 4481     Clinical Impression Statement Patient motivated and reports feeling better- able to participate in more weight bearing activities. He again struggled with walking with just one support and did improve with use of HHA on one side and cane on opposite. He did demo improved weight bearing today overall versus recent visits. He will benefit from further skilled PT to improve BLE strength, balance and functional mobility    Personal Factors and Comorbidities Age;Comorbidity 3+    Comorbidities HTN, CVA, cholecystitis with cholecystostomy drain    Examination-Activity Limitations Bend;Lift;Squat;Locomotion Level;Stairs;Stand    Examination-Participation Restrictions Community Activity;Driving;Laundry;Meal Prep;Shop    Stability/Clinical Decision Making Unstable/Unpredictable    Rehab Potential Fair    PT Frequency 2x / week    PT Duration 12 weeks    PT Treatment/Interventions ADLs/Self Care Home Management;Cryotherapy;Electrical Stimulation;Iontophoresis 4mg /ml Dexamethasone;Moist Heat;Traction;Ultrasound;DME Instruction;Gait training;Stair  training;Therapeutic activities;Therapeutic exercise;Functional mobility training;Balance training;Neuromuscular re-education;Patient/family education;Manual techniques;Dry needling;Vestibular;Joint Manipulations;Spinal Manipulations;Aquatic Therapy    PT Next Visit Plan Continue to focus on LE strengthening and balance, dynamic balance tasks    PT Home Exercise Plan no changes    Consulted and Agree with Plan of Care Patient             Patient will benefit from skilled therapeutic intervention in order to improve the following deficits and impairments:  Abnormal gait, Decreased balance, Decreased mobility, Difficulty walking, Decreased knowledge of  precautions, Decreased safety awareness, Decreased activity tolerance, Decreased strength, Decreased endurance  Visit Diagnosis: Abnormality of gait and mobility  Difficulty in walking, not elsewhere classified  Muscle weakness (generalized)  Unsteadiness on feet     Problem List Patient Active Problem List   Diagnosis Date Noted   Localized osteoarthritis of knees, bilateral 06/13/2021   Moderate protein-calorie malnutrition ([place]) 03/11/2021   Hemiparesis affecting left side as late effect of cerebrovascular accident (CVA) ([place]) 03/11/2021   Aneurysm, aorta, thoracic 12/21/2020   Calculus of bile duct without cholecystitis and without obstruction    Non-small cell carcinoma of lung, right ([place]) 09/07/2020   Mass of upper lobe of right lung 07/13/2020   Calculus of bile duct with cholecystitis without obstruction    Abnormal findings on imaging of biliary tract    Acute cholecystitis 05/02/2020   Chronic venous insufficiency 12/05/2019   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    CKD (chronic kidney disease), stage II    New onset atrial fibrillation ([facility])    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   Goals of care, counseling/discussion    Palliative care by specialist    DNR (do not resuscitate) discussion    History of CVA (cerebrovascular accident) 05/22/2019   Persistent proteinuria 07/30/2018   Mobitz type 2 second degree heart block 07/13/2018   Chronic kidney disease, stage III (moderate) ([place]) 06/15/2018   Leg pain 05/06/2018   Lymphedema 05/06/2018   Coagulopathy ([place]) 07/07/2017   Lumbar spondylosis 03/10/2017   Elevated uric acid in blood 11/20/2016   BPH (benign prostatic hyperplasia) 05/29/2016   Peripheral vascular disease of lower extremity ([place]) 03/03/2016   Gallstone 12/31/2015   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   Atherosclerosis of aorta ([place]) 11/22/2015   Carotid artery narrowing 11/22/2015    Diverticulosis of colon 11/22/2015   Decreased creatinine clearance 11/22/2015   Arthritis, degenerative 11/22/2015   Lactose intolerance 11/22/2015   Basal cell carcinoma 11/22/2015   Essential hypertension 06/21/2015   Hyperlipemia 06/21/2015   GERD (gastroesophageal reflux disease) 06/21/2015   Calculus of kidney 11/18/2013    Lewis Moccasin, PT 09/20/2021, 8:45 AM  [facility] [address] [place], [place], [address] Phone: [phone]   Fax:  [phone]  Name: IOAN LANDINI MRN: [id number] Date of Birth: [birth year]

## 2021-09-20 LAB — COMPLETE METABOLIC PANEL WITH GFR
AG Ratio: 1.4 (calc) (ref 1.0–2.5)
ALT: 7 U/L — ABNORMAL LOW (ref 9–46)
AST: 13 U/L (ref 10–35)
Albumin: 3.6 g/dL (ref 3.6–5.1)
Alkaline phosphatase (APISO): 52 U/L (ref 35–144)
BUN/Creatinine Ratio: 16 (calc) (ref 6–22)
BUN: 23 mg/dL (ref 7–25)
CO2: 27 mmol/L (ref 20–32)
Calcium: 9.4 mg/dL (ref 8.6–10.3)
Chloride: 105 mmol/L (ref 98–110)
Creat: 1.43 mg/dL — ABNORMAL HIGH (ref 0.70–1.22)
Globulin: 2.5 g/dL (calc) (ref 1.9–3.7)
Glucose, Bld: 78 mg/dL (ref 65–99)
Potassium: 4.7 mmol/L (ref 3.5–5.3)
Sodium: 140 mmol/L (ref 135–146)
Total Bilirubin: 0.8 mg/dL (ref 0.2–1.2)
Total Protein: 6.1 g/dL (ref 6.1–8.1)
eGFR: 46 mL/min/{1.73_m2} — ABNORMAL LOW (ref >=60)

## 2021-09-20 LAB — MAGNESIUM: Magnesium: 2.1 mg/dL (ref 1.5–2.5)

## 2021-09-24 ENCOUNTER — Ambulatory Visit: Payer: Medicare Other

## 2021-09-24 ENCOUNTER — Other Ambulatory Visit: Payer: Self-pay

## 2021-09-24 DIAGNOSIS — R269 Unspecified abnormalities of gait and mobility: Secondary | ICD-10-CM

## 2021-09-24 DIAGNOSIS — R2681 Unsteadiness on feet: Secondary | ICD-10-CM

## 2021-09-24 DIAGNOSIS — R262 Difficulty in walking, not elsewhere classified: Secondary | ICD-10-CM

## 2021-09-24 DIAGNOSIS — M6281 Muscle weakness (generalized): Secondary | ICD-10-CM

## 2021-09-24 NOTE — Therapy (Signed)
[place] MAIN [facility] SERVICES [address] [place], [place], [address] Phone: [phone]   Fax:  [phone]  Physical Therapy Treatment  Patient Details  Name: Jose Castro MRN: [id number] Date of Birth: 12-29-1925 No data recorded  Encounter Date: 09/24/2021   PT End of Session - 09/24/21 0823     Visit Number 56    Number of Visits 61    Date for PT Re-Evaluation 11/05/21    Authorization Type eval: 09/20/20, Progress note 01/02/2021, PN on 02/01/1961, Recert = 2/29/7989-01/12/9416; PN on 4/202022; PN on 4/0/8144; Recert= 8/18/56314-08/07/262; PN on 06/11/21; PN on 7/85/8850; Recert on 2/77/4128- 78/05/7671; PN on 09/03/2021    PT Start Time 1345    PT Stop Time 1428    PT Time Calculation (min) 43 min    Equipment Utilized During Treatment Gait belt    Activity Tolerance Patient tolerated treatment well    Behavior During Therapy WFL for tasks assessed/performed             Past Medical History:  Diagnosis Date   Allergy    Penicillin   Arthritis    Basal cell carcinoma    BPH (benign prostatic hyperplasia)    Chronic kidney disease    had a kidney stone which per family was a cyst that was removed   Dysrhythmia    GERD (gastroesophageal reflux disease)    History of kidney stones    Hyperlipidemia    Hypertension    Left-sided low back pain with left-sided sciatica    Lung mass    Splenic vein thrombosis    Stroke ([place]) 05/22/2019   Thrombosis 12/2015   mural  area and no notation of heart attack    Past Surgical History:  Procedure Laterality Date   APPENDECTOMY     CATARACT EXTRACTION, BILATERAL     CYSTOSCOPY WITH INSERTION OF UROLIFT     ENDOSCOPIC RETROGRADE CHOLANGIOPANCREATOGRAPHY (ERCP) WITH PROPOFOL N/A 05/11/2020   Procedure: ENDOSCOPIC RETROGRADE CHOLANGIOPANCREATOGRAPHY (ERCP) WITH PROPOFOL;  Surgeon: [name], MD;  Location: [facility] ENDOSCOPY;  Service: Endoscopy;  Laterality: N/A;   ERCP N/A 10/23/2020    Procedure: ENDOSCOPIC RETROGRADE CHOLANGIOPANCREATOGRAPHY (ERCP);  Surgeon: [name], MD;  Location: [facility] ENDOSCOPY;  Service: Endoscopy;  Laterality: N/A;   EYE SURGERY  08/2018   IR EXCHANGE BILIARY DRAIN  05/04/2020   KIDNEY STONE SURGERY     KNEE SURGERY     PACEMAKER INSERTION N/A 07/13/2018   Procedure: INSERTION PACEMAKER-DUEL CHAMBER INITIAL IMPLANT;  Surgeon: Isaias Cowman, MD;  Location: [facility] ORS;  Service: Cardiovascular;  Laterality: N/A;   SPINE SURGERY      There were no vitals filed for this visit.  Interventions:   Therapeutic Exercises:   Subjective Assessment - 09/24/21 0816     Subjective Patient reports no new issues today. He presents today with 2 of his grandaughters (one is the bride in upcoming wedding).    Patient is accompained by: Family member    Pertinent History Pt referred for deconditioning and gait instability. He has had a decline since he last finished therapy. Pt had a hospital admission from 05/02/20 to 05/14/20 for acute cholecystitis and is now s/p cholecystostomy drain placement. Drain is still in place and pt is waiting to hear when they plan to remove it. Pt discharged to SNF and then returned home. In addition pt underwent radiation therapy for a RUL lung mass/cancer and had his last radiation treatment in September of 2021. He reports  that he has continued to walk at home as his primary exercise. Pt was previously seen at this clinic for PT/OT due to weakness/imbalance s/p CVA. Prior history from 09/21/19: Pt is a 87 year old male who presents with imbalance and difficulty with gait following a right frontal CVA with left hemiparesis on 05/22/19.  He has completed inpatient and home health PT, and now presents for OP PT.  He was discharged from inpatient rehab at a supervision level of assistance, ambulating with a RW.  His family notes that he is impulsive and will try to ambulate without his RW at home.  Family is currently providing 24/7  supervision.    Limitations House hold activities;Walking    How long can you sit comfortably? no limitations    How long can you walk comfortably? he requires a RW    Patient Stated Goals Pt wants to be able to walk his granddaughter down the aisle at her wedding November 2022    Currently in Pain? Yes    Pain Score 4     Pain Location Knee    Pain Orientation Left;Anterior    Pain Descriptors / Indicators Aching    Pain Type Chronic pain    Pain Onset More than a month ago    Pain Frequency Constant    Aggravating Factors  Sit to stand transfers; prolonged sitting    Pain Relieving Factors Rest, use of knee brace    Effect of Pain on Daily Activities Limited standing; difficulty with transfers; and limited walking             Seated hamstring with RTB 2 sets of 12 reps with VC for technique including to perform slow and controlled and to go through full range of motion.  Seated hip march BLE x 12 reps LAQ- BLE- VC to extend left LE 2 sets of 12 reps each.  Sit to stand- 10 reps without UE support- VC and TC to have patient lean forward.  Static stand with forward trunk lean into Trunk ext x 15 reps focusing on standing up as erect as possible.  Standing Horizontal shoulder abd (active Motion) x 15 reps with VC to keep Arms at 90 deg sh. Flexed.  Education provided throughout session via VC/TC and demonstration to facilitate movement at target joints and correct muscle activation for all exercises performed.    Gait training - simulating Walking down aisle -with granddaughter - arm in arm (VC and visual demo provided for granddaughter)  Patient ambulated initially- 50 feet with arm in arm technique (patient on right side) however increased antalgic gait with reported left knee pain. Added cane- Brief review of gait sequencing with arm in arm technique on one side and cane on opp side- Patient ambulated 100 feet with improved balance, step length, and no obvious limp. Patient and  bride to be satisfied with this technique and encouraged to practice at home.   Clinical Impression: Patient performed well with all LE strengthening without increased report of left knee pain. He did present with difficulty with gait today walking with granddaughter. He wanted to remain traditional and did not want to practice with bride on opp side so added a cane with very good results and both patient and granddaughter reporting comfortable with this technique. He will benefit from more practice prior to wedding and He will benefit from further skilled PT to improve BLE strength, balance and functional mobility  PT Education - 09/25/21 0822     Education Details Gait sequencing; safety with assistive device and exercise technique    Person(s) Educated Patient    Methods Explanation;Demonstration;Tactile cues;Verbal cues    Comprehension Returned demonstration;Verbal cues required;Need further instruction;Verbalized understanding              PT Short Term Goals - 01/21/21 1438       PT SHORT TERM GOAL #1   Title Pt will be independent with HEP in order to improve strength and balance in order to decrease fall risk and improve function at home.    Baseline 01/02/2021- Patient verbalize understanding of walking and seated exercises for home program. 01/21/21 patient has been more active and understands HEP.    Time 6    Period Weeks    Status Achieved    Target Date 01/10/21               PT Long Term Goals - 09/03/21 1405       PT LONG TERM GOAL #1   Title Pt will improve BERG by at least 3 points in order to demonstrate clinically significant improvement in balance.    Baseline 09/20/20: 32/56 (Previous discharge: 03/06/20: 46/56), 12/14: 32/56, 1/13: 31/56, 01/16/21 35/56, 02/06/2021=36/56. 02/18/2021= 37/56.03/20/2021- Deferred secondary to left knee aching- will attempt next visit. 05/06/2021= 39/56; 05/13/2021= 36/56 *patient having  more left knee pain. 07/16/2021= 40/56    Time 12    Period Weeks    Status Achieved      PT LONG TERM GOAL #2   Title Pt will decrease TUG to below 14 seconds/decrease in order to demonstrate decreased fall risk.    Baseline 09/20/20: 19.2s, 12/14: 17.48, 1/13: 16.65 sec. 01/02/2021=15.75 sec using 4WW, 01/21/21  14.37 seconds. 02/06/2021= 14.92 sec using 4WW. 02/18/2021= 14.5 sec using 4WW. 03/20/2021- Deferred secondary to left knee aching- will attempt next visit. 05/06/2021= 16.32 sec avg using 4WW. 05/13/2021= 16.5 sec with 4WW *increased left knee pain. 06/11/21: 20.11 seconds (increased time to turn) using 4WW;  08/13/2021- Deferred testing secondary to patient not feeling well (recovering from recent bout of COVID). Will test next 1-2 sessions. 09/03/2021= 16.83 sec using 4WW    Time 12    Period Weeks    Status On-going    Target Date 11/05/21      PT LONG TERM GOAL #3   Title Pt will decrease 5TSTS by at least 3 seconds in order to demonstrate clinically significant improvement in LE strength.    Baseline 09/20/20: 23.1s with minA+1 for strength and balance (Previous discharge 03/06/20: 16.1s), 12/14: 20 sec, 1/13: 22 sec.   01/02/2021= 16.05 sec with light UE support. 01/21/21 01/21/21 37.90 seconds without UE, with UE support 12.93 seconds: 02/06/2021= with 1 UE 17.93 sec. 02/18/2021= 21.33 sec without UE support. 05/06/2021= 15.3 sec with BUE Support and 21. 0 sec without UE support. 05/13/2021=15.0 sec with BUE support; 09/03/2021= 16 sec without UE support.    Time 12    Period Weeks    Status Achieved      PT LONG TERM GOAL #4   Title Pt will increase his self-selected 10MWT to >0.74 m/s in order to demonstrate improvement in gait speed and improved community ambulation    Baseline 09/20/20: self-selected: 14.4s = 0.69 m/s with rollator (Previous discharge 03/06/20: self-selected: 13.6s = 0.74 m/s), 12/14: 0.75 m/s, 1/13: 0.78 m/s. 01/02/2021= 0.85 m/s using 4WW, 01/16/21 with 4 WW .99 m/s. 02/06/2021=  self selected = 0.93 m/s  with 4QV    Time 4    Period Weeks    Status Achieved      PT LONG TERM GOAL #5   Title Patient will increase six minute walk test distance to >1000 for progression to community ambulator and improve gait ability    Baseline 01/10/20  620 ft with rollator, 01/21/21  965 ft; 02/06/2021= 910 feet with 4WW.  02/18/2021= 950 feet with 4WW. 05/06/2021= will test next visit. 05/13/2021- Deferred secondary to increased left knee pain. 06/11/21: 784ft with 9DG *knee pain throughout test. 07/11/2021= 900 feet with use of 4WW.  08/13/2021- Deferred testing secondary to patient not feeling well (recovering from recent bout of COVID). Will test next 1-2 sessions. 09/03/2021= 750 feet with use of 4WW (recovering from COVID and strained left hamstring)    Time 12    Period Weeks    Status On-going    Target Date 11/05/21      PT LONG TERM GOAL #6   Title Patient will demonstrate ability to walk with HHA +1 > 200 ft. without loss of balance on level surfaces to simulate walking his grandaughter down the aisle for her upcoming wedding in November 2022.    Baseline 05/13/2021- Patient currently ambulating short community distance with use of 4WW but desires to walk with his grandaughter with HHA for her wedding. 06/11/21: attempted 5 steps with HHA x1, deemed unsafe. 07/16/2021- Patient ambulated around 100 feet with HHA (arms interlocked) with PT to simulate walking daughter down aisle.  08/13/2021- Deferred testing secondary to patient not feeling well (recovering from recent bout of COVID). Will test next 1-2 sessions.    Time 12    Period Weeks    Status On-going    Target Date 11/05/21      PT LONG TERM GOAL #7   Title Pt will decrease 5TSTS  to 14 sec or less with BUE support on armrest in order to demonstrate clinically significant improvement in LE strength.    Baseline 05/13/2021=15.0 sec with BUE support; 26 sec without UE support. 06/11/21: 14.61 seconds with BUE support. 07/16/2021= 23 sec  without UE support. 08/13/2021= 24. 2 sec with BUE support. 09/03/2021= 20.97 sec with B UE support - Limited by left hamstring pain.    Time 12    Period Weeks    Status On-going    Target Date 11/05/21                   Plan - 09/24/21 3875     Clinical Impression Statement Patient performed well with all LE strengthening without increased report of left knee pain. He did present with difficulty with gait today walking with granddaughter. He wanted to remain traditional and did not want to practice with bride on opp side so added a cane with very good results and both patient and granddaughter reporting comfortable with this technique. He will benefit from more practice prior to wedding and He will benefit from further skilled PT to improve BLE strength, balance and functional mobility    Personal Factors and Comorbidities Age;Comorbidity 3+    Comorbidities HTN, CVA, cholecystitis with cholecystostomy drain    Examination-Activity Limitations Bend;Lift;Squat;Locomotion Level;Stairs;Stand    Examination-Participation Restrictions Community Activity;Driving;Laundry;Meal Prep;Shop    Stability/Clinical Decision Making Unstable/Unpredictable    Rehab Potential Fair    PT Frequency 2x / week    PT Duration 12 weeks    PT Treatment/Interventions ADLs/Self Care Home Management;Cryotherapy;Electrical Stimulation;Iontophoresis 4mg /ml Dexamethasone;Moist Heat;Traction;Ultrasound;DME Instruction;Gait training;Stair training;Therapeutic activities;Therapeutic exercise;Functional  mobility training;Balance training;Neuromuscular re-education;Patient/family education;Manual techniques;Dry needling;Vestibular;Joint Manipulations;Spinal Manipulations;Aquatic Therapy    PT Next Visit Plan Continue to focus on LE strengthening and balance, dynamic balance tasks    PT Home Exercise Plan no changes    Consulted and Agree with Plan of Care Patient             Patient will benefit from skilled  therapeutic intervention in order to improve the following deficits and impairments:  Abnormal gait, Decreased balance, Decreased mobility, Difficulty walking, Decreased knowledge of precautions, Decreased safety awareness, Decreased activity tolerance, Decreased strength, Decreased endurance  Visit Diagnosis: Abnormality of gait and mobility  Difficulty in walking, not elsewhere classified  Muscle weakness (generalized)  Unsteadiness on feet     Problem List Patient Active Problem List   Diagnosis Date Noted   Localized osteoarthritis of knees, bilateral 06/13/2021   Moderate protein-calorie malnutrition ([place]) 03/11/2021   Hemiparesis affecting left side as late effect of cerebrovascular accident (CVA) ([place]) 03/11/2021   Aneurysm, aorta, thoracic 12/21/2020   Calculus of bile duct without cholecystitis and without obstruction    Non-small cell carcinoma of lung, right ([place]) 09/07/2020   Mass of upper lobe of right lung 07/13/2020   Calculus of bile duct with cholecystitis without obstruction    Abnormal findings on imaging of biliary tract    Acute cholecystitis 05/02/2020   Chronic venous insufficiency 12/05/2019   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    CKD (chronic kidney disease), stage II    New onset atrial fibrillation ([facility])    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   Goals of care, counseling/discussion    Palliative care by specialist    DNR (do not resuscitate) discussion    History of CVA (cerebrovascular accident) 05/22/2019   Persistent proteinuria 07/30/2018   Mobitz type 2 second degree heart block 07/13/2018   Chronic kidney disease, stage III (moderate) ([place]) 06/15/2018   Leg pain 05/06/2018   Lymphedema 05/06/2018   Coagulopathy ([place]) 07/07/2017   Lumbar spondylosis 03/10/2017   Elevated uric acid in blood 11/20/2016   BPH (benign prostatic hyperplasia) 05/29/2016   Peripheral vascular disease of lower extremity ([place]) 03/03/2016    Gallstone 12/31/2015   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   Atherosclerosis of aorta ([place]) 11/22/2015   Carotid artery narrowing 11/22/2015   Diverticulosis of colon 11/22/2015   Decreased creatinine clearance 11/22/2015   Arthritis, degenerative 11/22/2015   Lactose intolerance 11/22/2015   Basal cell carcinoma 11/22/2015   Essential hypertension 06/21/2015   Hyperlipemia 06/21/2015   GERD (gastroesophageal reflux disease) 06/21/2015   Calculus of kidney 11/18/2013    Lewis Moccasin, PT 09/25/2021, 8:53 AM  [place] [facility] MAIN [facility] SERVICES [address] [place], [place], [address] Phone: [phone]   Fax:  [phone]  Name: Jose Castro MRN: [id number] Date of Birth: 02/13/1926

## 2021-09-25 ENCOUNTER — Encounter
Admission: RE | Admit: 2021-09-25 | Discharge: 2021-09-25 | Disposition: A | Discharge status: Home / Self Care | Payer: Medicare Other | Source: Ambulatory Visit | Attending: Radiation Oncology | Admitting: Radiation Oncology

## 2021-09-25 DIAGNOSIS — R918 Other nonspecific abnormal finding of lung field: Secondary | ICD-10-CM | POA: Diagnosis present

## 2021-09-25 DIAGNOSIS — C3491 Malignant neoplasm of unspecified part of right bronchus or lung: Secondary | ICD-10-CM | POA: Diagnosis not present

## 2021-09-25 LAB — GLUCOSE, CAPILLARY: Glucose-Capillary: 68 mg/dL — ABNORMAL LOW (ref 70–99)

## 2021-09-25 MED ORDER — FLUDEOXYGLUCOSE F - 18 (FDG) INJECTION
8.2000 | Freq: Once | INTRAVENOUS | Status: AC | PRN
  Administered 2021-09-25: 8.74 via INTRAVENOUS

## 2021-09-26 ENCOUNTER — Other Ambulatory Visit: Payer: Self-pay

## 2021-09-26 ENCOUNTER — Ambulatory Visit: Payer: Medicare Other

## 2021-09-26 DIAGNOSIS — R2681 Unsteadiness on feet: Secondary | ICD-10-CM

## 2021-09-26 DIAGNOSIS — R269 Unspecified abnormalities of gait and mobility: Secondary | ICD-10-CM | POA: Diagnosis not present

## 2021-09-26 DIAGNOSIS — R262 Difficulty in walking, not elsewhere classified: Secondary | ICD-10-CM

## 2021-09-26 DIAGNOSIS — R278 Other lack of coordination: Secondary | ICD-10-CM

## 2021-09-26 DIAGNOSIS — M6281 Muscle weakness (generalized): Secondary | ICD-10-CM

## 2021-09-26 DIAGNOSIS — R2689 Other abnormalities of gait and mobility: Secondary | ICD-10-CM

## 2021-09-26 NOTE — Therapy (Signed)
[place] [facility] SERVICES [address] [place], [place], [address] Phone: [phone]   Fax:  [phone]  Physical Therapy Treatment  Patient Details  Name: Jose Castro MRN: [id number] Date of Birth: 07/21/1926 No data recorded  Encounter Date: 09/26/2021   PT End of Session - 09/26/21 1416     Visit Number 25    Number of Visits 23    Date for PT Re-Evaluation 11/05/21    Authorization Type [employer] Medicare    Authorization Time Period 08/13/2021- 11/05/2021    Progress Note Due on Visit 70    PT Start Time 1346    PT Stop Time 1426    PT Time Calculation (min) 40 min    Equipment Utilized During Treatment Gait belt    Activity Tolerance Patient tolerated treatment well;No increased pain    Behavior During Therapy WFL for tasks assessed/performed             Past Medical History:  Diagnosis Date   Allergy    Penicillin   Arthritis    Basal cell carcinoma    BPH (benign prostatic hyperplasia)    Chronic kidney disease    had a kidney stone which per family was a cyst that was removed   Dysrhythmia    GERD (gastroesophageal reflux disease)    History of kidney stones    Hyperlipidemia    Hypertension    Left-sided low back pain with left-sided sciatica    Lung mass    Splenic vein thrombosis    Stroke ([place]) 05/22/2019   Thrombosis 12/2015   mural  area and no notation of heart attack    Past Surgical History:  Procedure Laterality Date   APPENDECTOMY     CATARACT EXTRACTION, BILATERAL     CYSTOSCOPY WITH INSERTION OF UROLIFT     ENDOSCOPIC RETROGRADE CHOLANGIOPANCREATOGRAPHY (ERCP) WITH PROPOFOL N/A 05/11/2020   Procedure: ENDOSCOPIC RETROGRADE CHOLANGIOPANCREATOGRAPHY (ERCP) WITH PROPOFOL;  Surgeon: [name], MD;  Location: [facility] ENDOSCOPY;  Service: Endoscopy;  Laterality: N/A;   ERCP N/A 10/23/2020   Procedure: ENDOSCOPIC RETROGRADE CHOLANGIOPANCREATOGRAPHY (ERCP);  Surgeon: [name], MD;  Location: [facility]  ENDOSCOPY;  Service: Endoscopy;  Laterality: N/A;   EYE SURGERY  08/2018   IR EXCHANGE BILIARY DRAIN  05/04/2020   KIDNEY STONE SURGERY     KNEE SURGERY     PACEMAKER INSERTION N/A 07/13/2018   Procedure: INSERTION PACEMAKER-DUEL CHAMBER INITIAL IMPLANT;  Surgeon: Isaias Cowman, MD;  Location: [facility] ORS;  Service: Cardiovascular;  Laterality: N/A;   SPINE SURGERY      There were no vitals filed for this visit.   Subjective Assessment - 09/26/21 1357     Subjective Pt arrives with granddaughter today with Candie Mile, who reports no significant updates since previous visit.    Pertinent History Pt referred for deconditioning and gait instability. He has had a decline since he last finished therapy. Pt had a hospital admission from 05/02/20 to 05/14/20 for acute cholecystitis and is now s/p cholecystostomy drain placement. Drain is still in place and pt is waiting to hear when they plan to remove it. Pt discharged to SNF and then returned home. In addition pt underwent radiation therapy for a RUL lung mass/cancer and had his last radiation treatment in September of 2021. He reports that he has continued to walk at home as his primary exercise. Pt was previously seen at this clinic for PT/OT due to weakness/imbalance s/p CVA. Prior history from 09/21/19: Pt is  a 87 year old male who presents with imbalance and difficulty with gait following a right frontal CVA with left hemiparesis on 05/22/19.  He has completed inpatient and home health PT, and now presents for OP PT.  He was discharged from inpatient rehab at a supervision level of assistance, ambulating with a RW.  His family notes that he is impulsive and will try to ambulate without his RW at home.  Family is currently providing 24/7 supervision.    Currently in Pain? No/denies   typical chronic pain, no new complaints            INTERVENTION -STS from chair + airex hands free, intermittent light-minA at pelvis from author, 1x10, rest, 1x10 with  minA  -Static stance x60seconds -seated marching 1x20 (bilat)  -seated LAQ 1x20 (bilat)  -seated marching 1x20 (bilat)  -seated LAQ 1x20 (bilat)  -seated cable resisted HS curls x12, 3 sets (7.5lb Rt, 5.5lb Left)  -cable resisted row 1x12, column #11, 17.5lb -seated cable resisted HS curls x12, 1 sets (5.5lb Left only)  -cable resisted row 1x12, column #15 (elevated to increase scapular depression), 17.5lb -lateral side stepping in //bars bilat, UE supported PRN      PT Education - 09/26/21 1359     Education Details safety with activity              PT Short Term Goals - 01/21/21 1438       PT SHORT TERM GOAL #1   Title Pt will be independent with HEP in order to improve strength and balance in order to decrease fall risk and improve function at home.    Baseline 01/02/2021- Patient verbalize understanding of walking and seated exercises for home program. 01/21/21 patient has been more active and understands HEP.    Time 6    Period Weeks    Status Achieved    Target Date 01/10/21               PT Long Term Goals - 09/03/21 1405       PT LONG TERM GOAL #1   Title Pt will improve BERG by at least 3 points in order to demonstrate clinically significant improvement in balance.    Baseline 09/20/20: 32/56 (Previous discharge: 03/06/20: 46/56), 12/14: 32/56, 1/13: 31/56, 01/16/21 35/56, 02/06/2021=36/56. 02/18/2021= 37/56.03/20/2021- Deferred secondary to left knee aching- will attempt next visit. 05/06/2021= 39/56; 05/13/2021= 36/56 *patient having more left knee pain. 07/16/2021= 40/56    Time 12    Period Weeks    Status Achieved      PT LONG TERM GOAL #2   Title Pt will decrease TUG to below 14 seconds/decrease in order to demonstrate decreased fall risk.    Baseline 09/20/20: 19.2s, 12/14: 17.48, 1/13: 16.65 sec. 01/02/2021=15.75 sec using 4WW, 01/21/21  14.37 seconds. 02/06/2021= 14.92 sec using 4WW. 02/18/2021= 14.5 sec using 4WW. 03/20/2021- Deferred secondary to left knee  aching- will attempt next visit. 05/06/2021= 16.32 sec avg using 4WW. 05/13/2021= 16.5 sec with 4WW *increased left knee pain. 06/11/21: 20.11 seconds (increased time to turn) using 4WW;  08/13/2021- Deferred testing secondary to patient not feeling well (recovering from recent bout of COVID). Will test next 1-2 sessions. 09/03/2021= 16.83 sec using 4WW    Time 12    Period Weeks    Status On-going    Target Date 11/05/21      PT LONG TERM GOAL #3   Title Pt will decrease 5TSTS by at least 3 seconds in order to demonstrate clinically  significant improvement in LE strength.    Baseline 09/20/20: 23.1s with minA+1 for strength and balance (Previous discharge 03/06/20: 16.1s), 12/14: 20 sec, 1/13: 22 sec.   01/02/2021= 16.05 sec with light UE support. 01/21/21 01/21/21 37.90 seconds without UE, with UE support 12.93 seconds: 02/06/2021= with 1 UE 17.93 sec. 02/18/2021= 21.33 sec without UE support. 05/06/2021= 15.3 sec with BUE Support and 21. 0 sec without UE support. 05/13/2021=15.0 sec with BUE support; 09/03/2021= 16 sec without UE support.    Time 12    Period Weeks    Status Achieved      PT LONG TERM GOAL #4   Title Pt will increase his self-selected 10MWT to >0.74 m/s in order to demonstrate improvement in gait speed and improved community ambulation    Baseline 09/20/20: self-selected: 14.4s = 0.69 m/s with rollator (Previous discharge 03/06/20: self-selected: 13.6s = 0.74 m/s), 12/14: 0.75 m/s, 1/13: 0.78 m/s. 01/02/2021= 0.85 m/s using 4WW, 01/16/21 with 4 WW .99 m/s. 02/06/2021= self selected = 0.93 m/s with 1OX    Time 4    Period Weeks    Status Achieved      PT LONG TERM GOAL #5   Title Patient will increase six minute walk test distance to >1000 for progression to community ambulator and improve gait ability    Baseline 01/10/20  620 ft with rollator, 01/21/21  965 ft; 02/06/2021= 910 feet with 4WW.  02/18/2021= 950 feet with 4WW. 05/06/2021= will test next visit. 05/13/2021- Deferred secondary to increased left  knee pain. 06/11/21: 749ft with 0RU *knee pain throughout test. 07/11/2021= 900 feet with use of 4WW.  08/13/2021- Deferred testing secondary to patient not feeling well (recovering from recent bout of COVID). Will test next 1-2 sessions. 09/03/2021= 750 feet with use of 4WW (recovering from COVID and strained left hamstring)    Time 12    Period Weeks    Status On-going    Target Date 11/05/21      PT LONG TERM GOAL #6   Title Patient will demonstrate ability to walk with HHA +1 > 200 ft. without loss of balance on level surfaces to simulate walking his grandaughter down the aisle for her upcoming wedding in November 2022.    Baseline 05/13/2021- Patient currently ambulating short community distance with use of 4WW but desires to walk with his grandaughter with HHA for her wedding. 06/11/21: attempted 5 steps with HHA x1, deemed unsafe. 07/16/2021- Patient ambulated around 100 feet with HHA (arms interlocked) with PT to simulate walking daughter down aisle.  08/13/2021- Deferred testing secondary to patient not feeling well (recovering from recent bout of COVID). Will test next 1-2 sessions.    Time 12    Period Weeks    Status On-going    Target Date 11/05/21      PT LONG TERM GOAL #7   Title Pt will decrease 5TSTS  to 14 sec or less with BUE support on armrest in order to demonstrate clinically significant improvement in LE strength.    Baseline 05/13/2021=15.0 sec with BUE support; 26 sec without UE support. 06/11/21: 14.61 seconds with BUE support. 07/16/2021= 23 sec without UE support. 08/13/2021= 24. 2 sec with BUE support. 09/03/2021= 20.97 sec with B UE support - Limited by left hamstring pain.    Time 12    Period Weeks    Status On-going    Target Date 11/05/21  Plan - 09/26/21 1419     Clinical Impression Statement Continued with current plan of care as laid out in evaluation and recent prior sessions. All interventions tolerated as expected by author. Mobility and  strength continue to progress as anticipated. Moved hamstrings flexion to cable resistance, noteworthy of Left strength deficit of ~25% estimated with 12RM. Recovery intervals given as needed based on signs of exertion and/or pt request. Pt educated on best technique for each intervention- author uses verbal, visual, tactile cues to optimize learning. Author takes steps to maximize patient independence when appropriate. Pt remains highly motivated. Pt closely monitored throughout session for safe activity response, as well as to maximize patient safety during interventions.   The patient's therapy prognosis indicates continued potential for improvement, anticipate that future progress is attainable in a reasonable/predictable timeframe. Maximum improvement is within reach. Pt will continue to benefit from skilled PT services to address deficits and impairment identified in evaluation in order to maximize independence and safety in basic mobility required for performance of ADL, IADL, and leisure.    Personal Factors and Comorbidities Age;Comorbidity 3+    Comorbidities HTN, CVA    Examination-Activity Limitations Bend;Lift;Squat;Locomotion Level;Stairs;Stand    Examination-Participation Restrictions Community Activity;Driving;Laundry;Meal Prep;Shop    Stability/Clinical Decision Making Unstable/Unpredictable    Clinical Decision Making High    Rehab Potential Fair    PT Frequency 2x / week    PT Duration 12 weeks    PT Treatment/Interventions ADLs/Self Care Home Management;Cryotherapy;Electrical Stimulation;Iontophoresis 4mg /ml Dexamethasone;Moist Heat;Traction;Ultrasound;DME Instruction;Gait training;Stair training;Therapeutic activities;Therapeutic exercise;Functional mobility training;Balance training;Neuromuscular re-education;Patient/family education;Manual techniques;Dry needling;Vestibular;Joint Manipulations;Spinal Manipulations;Aquatic Therapy    PT Next Visit Plan Continue to focus on LE  strengthening and balance, dynamic balance tasks    PT Home Exercise Plan no changes    Consulted and Agree with Plan of Care Patient;Family member/caregiver    Family Member [employer]             Patient will benefit from skilled therapeutic intervention in order to improve the following deficits and impairments:  Abnormal gait, Decreased balance, Decreased mobility, Difficulty walking, Decreased knowledge of precautions, Decreased safety awareness, Decreased activity tolerance, Decreased strength, Decreased endurance  Visit Diagnosis: Abnormality of gait and mobility  Difficulty in walking, not elsewhere classified  Muscle weakness (generalized)  Unsteadiness on feet  Other lack of coordination  Other abnormalities of gait and mobility     Problem List Patient Active Problem List   Diagnosis Date Noted   Localized osteoarthritis of knees, bilateral 06/13/2021   Moderate protein-calorie malnutrition ([place]) 03/11/2021   Hemiparesis affecting left side as late effect of cerebrovascular accident (CVA) ([place]) 03/11/2021   Aneurysm, aorta, thoracic 12/21/2020   Calculus of bile duct without cholecystitis and without obstruction    Non-small cell carcinoma of lung, right ([place]) 09/07/2020   Mass of upper lobe of right lung 07/13/2020   Calculus of bile duct with cholecystitis without obstruction    Abnormal findings on imaging of biliary tract    Acute cholecystitis 05/02/2020   Chronic venous insufficiency 12/05/2019   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    CKD (chronic kidney disease), stage II    New onset atrial fibrillation ([place])    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   Goals of care, counseling/discussion    Palliative care by specialist    DNR (do not resuscitate) discussion    History of CVA (cerebrovascular accident) 05/22/2019   Persistent proteinuria 07/30/2018   Mobitz type 2 second degree  heart block 07/13/2018   Chronic  kidney disease, stage III (moderate) ([place]) 06/15/2018   Leg pain 05/06/2018   Lymphedema 05/06/2018   Coagulopathy ([place]) 07/07/2017   Lumbar spondylosis 03/10/2017   Elevated uric acid in blood 11/20/2016   BPH (benign prostatic hyperplasia) 05/29/2016   Peripheral vascular disease of lower extremity ([place]) 03/03/2016   Gallstone 12/31/2015   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   Atherosclerosis of aorta ([place]) 11/22/2015   Carotid artery narrowing 11/22/2015   Diverticulosis of colon 11/22/2015   Decreased creatinine clearance 11/22/2015   Arthritis, degenerative 11/22/2015   Lactose intolerance 11/22/2015   Basal cell carcinoma 11/22/2015   Essential hypertension 06/21/2015   Hyperlipemia 06/21/2015   GERD (gastroesophageal reflux disease) 06/21/2015   Calculus of kidney 11/18/2013   2:32 PM, 09/26/21 Etta Grandchild, PT, DPT Physical Therapist - [facility]  Outpatient Physical Therapy- [place] [phone]     [place], PT 09/26/2021, 2:29 PM  [place] [facility] SERVICES [address] [place], [place], [address] Phone: [phone]   Fax:  [phone]  Name: Jose Castro MRN: [id number] Date of Birth: 1926-07-22

## 2021-09-30 ENCOUNTER — Other Ambulatory Visit: Payer: Self-pay

## 2021-09-30 ENCOUNTER — Ambulatory Visit
Admission: RE | Admit: 2021-09-30 | Discharge: 2021-09-30 | Disposition: A | Discharge status: Home / Self Care | Payer: Medicare Other | Source: Ambulatory Visit | Attending: Radiation Oncology | Admitting: Radiation Oncology

## 2021-09-30 VITALS — BP 119/75 | HR 61 | Temp 97.0°F | Resp 16 | Wt 159.0 lb

## 2021-09-30 DIAGNOSIS — R911 Solitary pulmonary nodule: Secondary | ICD-10-CM | POA: Insufficient documentation

## 2021-09-30 DIAGNOSIS — C3411 Malignant neoplasm of upper lobe, right bronchus or lung: Secondary | ICD-10-CM | POA: Diagnosis present

## 2021-09-30 DIAGNOSIS — Z923 Personal history of irradiation: Secondary | ICD-10-CM | POA: Diagnosis not present

## 2021-09-30 NOTE — Progress Notes (Signed)
Radiation Oncology Follow up Note  Name: Jose Castro   Date:   09/30/2021 MRN:  [id number] DOB: 01/31/1926    This 87 y.o. male presents to the clinic today for 1 year follow-up status post SBRT to the posterior portion of the right upper lobe for stage I non-small cell lung cancer.  REFERRING PROVIDER: Steele Sizer, MD  HPI: Patient is a 87 year old male now out 1 year having completed SBRT to his right upper lobe for stage I non-small cell lung cancer.  Clinically he is doing well specifically [place] cough hemoptysis or chest tightness.  Recently had a PET CT scan.  Showing no evidence of lung cancer recurrence stable postradiation changes in the right upper lobe.  Does have a stable groundglass nodule in the right upper lobe which we will follow.  COMPLICATIONS OF TREATMENT: none  FOLLOW UP COMPLIANCE: keeps appointments   PHYSICAL EXAM:  BP 119/75   Pulse 61   Temp (!) 97 F (36.1 C) (Tympanic)   Resp 16   Wt 159 lb (72.1 kg)   BMI 22.81 kg/m  Well-developed well-nourished patient in NAD. HEENT reveals PERLA, EOMI, discs not visualized.  Oral cavity is clear. No oral mucosal lesions are identified. Neck is clear without evidence of cervical or supraclavicular adenopathy. Lungs are clear to A&P. Cardiac examination is essentially unremarkable with regular rate and rhythm without murmur rub or thrill. Abdomen is benign with no organomegaly or masses noted. Motor sensory and DTR levels are equal and symmetric in the upper and lower extremities. Cranial nerves II through XII are grossly intact. Proprioception is intact. No peripheral adenopathy or edema is identified. No motor or sensory levels are noted. Crude visual fields are within normal range.  RADIOLOGY RESULTS: PET CT scan reviewed compatible with above-stated findings  PLAN: Present time patient continues to do well no significant significant side effects or complaints.  PET scan looks good with no evidence of disease.  I  will see him back in 6 months with a follow-up CT scan at that time.  Patient and family know to call with any concerns.  I would like to take this opportunity to thank you for allowing me to participate in the care of your patient.Noreene Filbert, MD

## 2021-10-01 ENCOUNTER — Ambulatory Visit: Payer: Medicare Other | Attending: Family Medicine

## 2021-10-01 DIAGNOSIS — R262 Difficulty in walking, not elsewhere classified: Secondary | ICD-10-CM | POA: Insufficient documentation

## 2021-10-01 DIAGNOSIS — R269 Unspecified abnormalities of gait and mobility: Secondary | ICD-10-CM | POA: Insufficient documentation

## 2021-10-01 DIAGNOSIS — R2681 Unsteadiness on feet: Secondary | ICD-10-CM | POA: Insufficient documentation

## 2021-10-01 DIAGNOSIS — M6281 Muscle weakness (generalized): Secondary | ICD-10-CM | POA: Diagnosis present

## 2021-10-01 DIAGNOSIS — R2689 Other abnormalities of gait and mobility: Secondary | ICD-10-CM | POA: Insufficient documentation

## 2021-10-01 NOTE — Therapy (Signed)
[place] MAIN [facility] SERVICES [address] [place], [place], [address] Phone: [phone]   Fax:  [phone]  Physical Therapy Treatment  Patient Details  Name: Jose Castro MRN: [id number] Date of Birth: [birth year] No data recorded  Encounter Date: 10/01/2021   PT End of Session - 10/01/21 1539     Visit Number 78    Number of Visits 76    Date for PT Re-Evaluation 11/05/21    Authorization Type [employer] Medicare    Authorization Time Period 08/13/2021- 11/05/2021    Progress Note Due on Visit 70    PT Start Time 1350    PT Stop Time 1428    PT Time Calculation (min) 38 min    Equipment Utilized During Treatment Gait belt    Activity Tolerance Patient tolerated treatment well;No increased pain    Behavior During Therapy WFL for tasks assessed/performed             Past Medical History:  Diagnosis Date   Allergy    Penicillin   Arthritis    Basal cell carcinoma    BPH (benign prostatic hyperplasia)    Chronic kidney disease    had a kidney stone which per family was a cyst that was removed   Dysrhythmia    GERD (gastroesophageal reflux disease)    History of kidney stones    Hyperlipidemia    Hypertension    Left-sided low back pain with left-sided sciatica    Lung mass    Splenic vein thrombosis    Stroke ([place]) 05/22/2019   Thrombosis 12/2015   mural  area and no notation of heart attack    Past Surgical History:  Procedure Laterality Date   APPENDECTOMY     CATARACT EXTRACTION, BILATERAL     CYSTOSCOPY WITH INSERTION OF UROLIFT     ENDOSCOPIC RETROGRADE CHOLANGIOPANCREATOGRAPHY (ERCP) WITH PROPOFOL N/A 05/11/2020   Procedure: ENDOSCOPIC RETROGRADE CHOLANGIOPANCREATOGRAPHY (ERCP) WITH PROPOFOL;  Surgeon: [name], MD;  Location: [facility] ENDOSCOPY;  Service: Endoscopy;  Laterality: N/A;   ERCP N/A 10/23/2020   Procedure: ENDOSCOPIC RETROGRADE CHOLANGIOPANCREATOGRAPHY (ERCP);  Surgeon: [name], MD;  Location: [facility]  ENDOSCOPY;  Service: Endoscopy;  Laterality: N/A;   EYE SURGERY  08/2018   IR EXCHANGE BILIARY DRAIN  05/04/2020   KIDNEY STONE SURGERY     KNEE SURGERY     PACEMAKER INSERTION N/A 07/13/2018   Procedure: INSERTION PACEMAKER-DUEL CHAMBER INITIAL IMPLANT;  Surgeon: Isaias Cowman, MD;  Location: [facility] ORS;  Service: Cardiovascular;  Laterality: N/A;   SPINE SURGERY      There were no vitals filed for this visit.   Subjective Assessment - 10/01/21 1540     Subjective Patient reports feeling about the same and reports no falls and no worsening of pain.    Pertinent History Pt referred for deconditioning and gait instability. He has had a decline since he last finished therapy. Pt had a hospital admission from 05/02/20 to 05/14/20 for acute cholecystitis and is now s/p cholecystostomy drain placement. Drain is still in place and pt is waiting to hear when they plan to remove it. Pt discharged to SNF and then returned home. In addition pt underwent radiation therapy for a RUL lung mass/cancer and had his last radiation treatment in September of 2021. He reports that he has continued to walk at home as his primary exercise. Pt was previously seen at this clinic for PT/OT due to weakness/imbalance s/p CVA. Prior history from 09/21/19: Pt is  a 87 year old male who presents with imbalance and difficulty with gait following a right frontal CVA with left hemiparesis on 05/22/19.  He has completed inpatient and home health PT, and now presents for OP PT.  He was discharged from inpatient rehab at a supervision level of assistance, ambulating with a RW.  His family notes that he is impulsive and will try to ambulate without his RW at home.  Family is currently providing 24/7 supervision.    Currently in Pain? Yes    Pain Score 4     Pain Location Knee    Pain Orientation Left    Pain Descriptors / Indicators Aching    Pain Type Acute pain    Pain Onset More than a month ago    Pain Frequency Constant     Aggravating Factors  transfers, Prolonged sitting    Pain Relieving Factors Knee brace, Rest    Effect of Pain on Daily Activities Limited Standing; difficulty with transfers; and limited walking    Multiple Pain Sites No           INTERVENTIONS:   Therapeutic exercises:    Ham curl with blue TB 2 sets of 12 reps- VC to pull left LE back more for optimal ROM.  Knee ext 5lb.- BLE- 2 sets of 12 reps- VC and physical assist to terminally knee ext.  Quad set- Hold 5 sec x 10 reps BLE.  Hip abd 5lb. With extended knee (straight leg) - min assist on left LE x 12 reps BLE.   Sit to stand x 8 reps (initially with just CGA- yet required min assist with last 2 reps)   Gait with hurrycane and Arm in arm assist f PT and use of cane on right side.  X 50 feet x 2. Patient able to walk with reciprocal steps and no atalgia.  On 1st attempt PT provided more support to left forearm yet on 2nd attempt- provided with traditional Arm in arm interlock technique. Observed no limp with gait today and patient reports being well pleased with walk today.   Clinical Impression: Patient performed well with non-weight bearing LE strengthening and able to increase reps and resistance today. He was more comfortable with gait with HHA and use of cane today and denied any pain. He continues to demo some difficulty with sit to stand and fatigued at end of session. He will benefit from further skilled PT to improve BLE strength, balance and functional mobility                       PT Education - 10/01/21 1547     Education Details exercise techniques    Person(s) Educated Patient    Methods Explanation;Demonstration;Tactile cues;Verbal cues    Comprehension Verbalized understanding;Returned demonstration;Verbal cues required;Tactile cues required;Need further instruction              PT Short Term Goals - 01/21/21 1438       PT SHORT TERM GOAL #1   Title Pt will be independent with HEP in  order to improve strength and balance in order to decrease fall risk and improve function at home.    Baseline 01/02/2021- Patient verbalize understanding of walking and seated exercises for home program. 01/21/21 patient has been more active and understands HEP.    Time 6    Period Weeks    Status Achieved    Target Date 01/10/21  PT Long Term Goals - 09/03/21 1405       PT LONG TERM GOAL #1   Title Pt will improve BERG by at least 3 points in order to demonstrate clinically significant improvement in balance.    Baseline 09/20/20: 32/56 (Previous discharge: 03/06/20: 46/56), 12/14: 32/56, 1/13: 31/56, 01/16/21 35/56, 02/06/2021=36/56. 02/18/2021= 37/56.03/20/2021- Deferred secondary to left knee aching- will attempt next visit. 05/06/2021= 39/56; 05/13/2021= 36/56 *patient having more left knee pain. 07/16/2021= 40/56    Time 12    Period Weeks    Status Achieved      PT LONG TERM GOAL #2   Title Pt will decrease TUG to below 14 seconds/decrease in order to demonstrate decreased fall risk.    Baseline 09/20/20: 19.2s, 12/14: 17.48, 1/13: 16.65 sec. 01/02/2021=15.75 sec using 4WW, 01/21/21  14.37 seconds. 02/06/2021= 14.92 sec using 4WW. 02/18/2021= 14.5 sec using 4WW. 03/20/2021- Deferred secondary to left knee aching- will attempt next visit. 05/06/2021= 16.32 sec avg using 4WW. 05/13/2021= 16.5 sec with 4WW *increased left knee pain. 06/11/21: 20.11 seconds (increased time to turn) using 4WW;  08/13/2021- Deferred testing secondary to patient not feeling well (recovering from recent bout of COVID). Will test next 1-2 sessions. 09/03/2021= 16.83 sec using 4WW    Time 12    Period Weeks    Status On-going    Target Date 11/05/21      PT LONG TERM GOAL #3   Title Pt will decrease 5TSTS by at least 3 seconds in order to demonstrate clinically significant improvement in LE strength.    Baseline 09/20/20: 23.1s with minA+1 for strength and balance (Previous discharge 03/06/20: 16.1s), 12/14: 20  sec, 1/13: 22 sec.   01/02/2021= 16.05 sec with light UE support. 01/21/21 01/21/21 37.90 seconds without UE, with UE support 12.93 seconds: 02/06/2021= with 1 UE 17.93 sec. 02/18/2021= 21.33 sec without UE support. 05/06/2021= 15.3 sec with BUE Support and 21. 0 sec without UE support. 05/13/2021=15.0 sec with BUE support; 09/03/2021= 16 sec without UE support.    Time 12    Period Weeks    Status Achieved      PT LONG TERM GOAL #4   Title Pt will increase his self-selected 10MWT to >0.74 m/s in order to demonstrate improvement in gait speed and improved community ambulation    Baseline 09/20/20: self-selected: 14.4s = 0.69 m/s with rollator (Previous discharge 03/06/20: self-selected: 13.6s = 0.74 m/s), 12/14: 0.75 m/s, 1/13: 0.78 m/s. 01/02/2021= 0.85 m/s using 4WW, 01/16/21 with 4 WW .99 m/s. 02/06/2021= self selected = 0.93 m/s with 3TD    Time 4    Period Weeks    Status Achieved      PT LONG TERM GOAL #5   Title Patient will increase six minute walk test distance to >1000 for progression to community ambulator and improve gait ability    Baseline 01/10/20  620 ft with rollator, 01/21/21  965 ft; 02/06/2021= 910 feet with 4WW.  02/18/2021= 950 feet with 4WW. 05/06/2021= will test next visit. 05/13/2021- Deferred secondary to increased left knee pain. 06/11/21: 739ft with 1VO *knee pain throughout test. 07/11/2021= 900 feet with use of 4WW.  08/13/2021- Deferred testing secondary to patient not feeling well (recovering from recent bout of COVID). Will test next 1-2 sessions. 09/03/2021= 750 feet with use of 4WW (recovering from COVID and strained left hamstring)    Time 12    Period Weeks    Status On-going    Target Date 11/05/21  PT LONG TERM GOAL #6   Title Patient will demonstrate ability to walk with HHA +1 > 200 ft. without loss of balance on level surfaces to simulate walking his grandaughter down the aisle for her upcoming wedding in November 2022.    Baseline 05/13/2021- Patient currently ambulating  short community distance with use of 4WW but desires to walk with his grandaughter with HHA for her wedding. 06/11/21: attempted 5 steps with HHA x1, deemed unsafe. 07/16/2021- Patient ambulated around 100 feet with HHA (arms interlocked) with PT to simulate walking daughter down aisle.  08/13/2021- Deferred testing secondary to patient not feeling well (recovering from recent bout of COVID). Will test next 1-2 sessions.    Time 12    Period Weeks    Status On-going    Target Date 11/05/21      PT LONG TERM GOAL #7   Title Pt will decrease 5TSTS  to 14 sec or less with BUE support on armrest in order to demonstrate clinically significant improvement in LE strength.    Baseline 05/13/2021=15.0 sec with BUE support; 26 sec without UE support. 06/11/21: 14.61 seconds with BUE support. 07/16/2021= 23 sec without UE support. 08/13/2021= 24. 2 sec with BUE support. 09/03/2021= 20.97 sec with B UE support - Limited by left hamstring pain.    Time 12    Period Weeks    Status On-going    Target Date 11/05/21                   Plan - 10/01/21 1538     Clinical Impression Statement Patient performed well with non-weight bearing LE strengthening and able to increase reps and resistance today. He was more comfortable with gait with HHA and use of cane today and denied any pain. He continues to demo some difficulty with sit to stand and fatigued at end of session. He will benefit from further skilled PT to improve BLE strength, balance and functional mobility    Personal Factors and Comorbidities Age;Comorbidity 3+    Comorbidities HTN, CVA    Examination-Activity Limitations Bend;Lift;Squat;Locomotion Level;Stairs;Stand    Examination-Participation Restrictions Community Activity;Driving;Laundry;Meal Prep;Shop    Stability/Clinical Decision Making Unstable/Unpredictable    Rehab Potential Fair    PT Frequency 2x / week    PT Duration 12 weeks    PT Treatment/Interventions ADLs/Self Care Home  Management;Cryotherapy;Electrical Stimulation;Iontophoresis 4mg /ml Dexamethasone;Moist Heat;Traction;Ultrasound;DME Instruction;Gait training;Stair training;Therapeutic activities;Therapeutic exercise;Functional mobility training;Balance training;Neuromuscular re-education;Patient/family education;Manual techniques;Dry needling;Vestibular;Joint Manipulations;Spinal Manipulations;Aquatic Therapy    PT Next Visit Plan Continue to focus on LE strengthening and balance, dynamic balance tasks    PT Home Exercise Plan no changes    Consulted and Agree with Plan of Care Patient;Family member/caregiver    Family Member Consulted Son             Patient will benefit from skilled therapeutic intervention in order to improve the following deficits and impairments:  Abnormal gait, Decreased balance, Decreased mobility, Difficulty walking, Decreased knowledge of precautions, Decreased safety awareness, Decreased activity tolerance, Decreased strength, Decreased endurance  Visit Diagnosis: Abnormality of gait and mobility  Difficulty in walking, not elsewhere classified  Muscle weakness (generalized)  Other abnormalities of gait and mobility  Unsteadiness on feet     Problem List Patient Active Problem List   Diagnosis Date Noted   Localized osteoarthritis of knees, bilateral 06/13/2021   Moderate protein-calorie malnutrition ([place]) 03/11/2021   Hemiparesis affecting left side as late effect of cerebrovascular accident (CVA) ([place]) 03/11/2021   Aneurysm, aorta,  thoracic 12/21/2020   Calculus of bile duct without cholecystitis and without obstruction    Non-small cell carcinoma of lung, right ([place]) 09/07/2020   Mass of upper lobe of right lung 07/13/2020   Calculus of bile duct with cholecystitis without obstruction    Abnormal findings on imaging of biliary tract    Acute cholecystitis 05/02/2020   Chronic venous insufficiency 12/05/2019   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     CKD (chronic kidney disease), stage II    New onset atrial fibrillation ([place])    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   Goals of care, counseling/discussion    Palliative care by specialist    DNR (do not resuscitate) discussion    History of CVA (cerebrovascular accident) 05/22/2019   Persistent proteinuria 07/30/2018   Mobitz type 2 second degree heart block 07/13/2018   Chronic kidney disease, stage III (moderate) ([place]) 06/15/2018   Leg pain 05/06/2018   Lymphedema 05/06/2018   Coagulopathy ([place]) 07/07/2017   Lumbar spondylosis 03/10/2017   Elevated uric acid in blood 11/20/2016   BPH (benign prostatic hyperplasia) 05/29/2016   Peripheral vascular disease of lower extremity ([place]) 03/03/2016   Gallstone 12/31/2015   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   Atherosclerosis of aorta ([place]) 11/22/2015   Carotid artery narrowing 11/22/2015   Diverticulosis of colon 11/22/2015   Decreased creatinine clearance 11/22/2015   Arthritis, degenerative 11/22/2015   Lactose intolerance 11/22/2015   Basal cell carcinoma 11/22/2015   Essential hypertension 06/21/2015   Hyperlipemia 06/21/2015   GERD (gastroesophageal reflux disease) 06/21/2015   Calculus of kidney 11/18/2013    Lewis Moccasin, PT 10/01/2021, 3:47 PM  [place] MAIN [facility] SERVICES [address] [place], [place], [address] Phone: [phone]   Fax:  [phone]  Name: Jose Castro MRN: [id number] Date of Birth: January 11, 1926

## 2021-10-03 ENCOUNTER — Other Ambulatory Visit: Payer: Self-pay

## 2021-10-03 ENCOUNTER — Ambulatory Visit: Payer: Medicare Other

## 2021-10-03 DIAGNOSIS — R269 Unspecified abnormalities of gait and mobility: Secondary | ICD-10-CM | POA: Diagnosis not present

## 2021-10-03 DIAGNOSIS — R2681 Unsteadiness on feet: Secondary | ICD-10-CM

## 2021-10-03 DIAGNOSIS — M6281 Muscle weakness (generalized): Secondary | ICD-10-CM

## 2021-10-03 DIAGNOSIS — R262 Difficulty in walking, not elsewhere classified: Secondary | ICD-10-CM

## 2021-10-03 NOTE — Therapy (Signed)
[place] MAIN [facility] SERVICES [address] [place], [place], [address] Phone: [phone]   Fax:  [phone]  Physical Therapy Treatment  Patient Details  Name: Jose Castro MRN: [id number] Date of Birth: Nov 29, 1926 No data recorded  Encounter Date: 10/03/2021   PT End of Session - 10/03/21 1353     Visit Number 51    Number of Visits 45    Date for PT Re-Evaluation 11/05/21    Authorization Type [employer] Medicare    Authorization Time Period 08/13/2021- 11/05/2021    Progress Note Due on Visit 70    PT Start Time 1345    PT Stop Time 1429    PT Time Calculation (min) 44 min    Equipment Utilized During Treatment Gait belt    Activity Tolerance Patient tolerated treatment well;No increased pain    Behavior During Therapy WFL for tasks assessed/performed             Past Medical History:  Diagnosis Date   Allergy    Penicillin   Arthritis    Basal cell carcinoma    BPH (benign prostatic hyperplasia)    Chronic kidney disease    had a kidney stone which per family was a cyst that was removed   Dysrhythmia    GERD (gastroesophageal reflux disease)    History of kidney stones    Hyperlipidemia    Hypertension    Left-sided low back pain with left-sided sciatica    Lung mass    Splenic vein thrombosis    Stroke ([place]) 05/22/2019   Thrombosis 12/2015   mural  area and no notation of heart attack    Past Surgical History:  Procedure Laterality Date   APPENDECTOMY     CATARACT EXTRACTION, BILATERAL     CYSTOSCOPY WITH INSERTION OF UROLIFT     ENDOSCOPIC RETROGRADE CHOLANGIOPANCREATOGRAPHY (ERCP) WITH PROPOFOL N/A 05/11/2020   Procedure: ENDOSCOPIC RETROGRADE CHOLANGIOPANCREATOGRAPHY (ERCP) WITH PROPOFOL;  Surgeon: [name], MD;  Location: [facility] ENDOSCOPY;  Service: Endoscopy;  Laterality: N/A;   ERCP N/A 10/23/2020   Procedure: ENDOSCOPIC RETROGRADE CHOLANGIOPANCREATOGRAPHY (ERCP);  Surgeon: [name], MD;  Location[facility]  ENDOSCOPY;  Service: Endoscopy;  Laterality: N/A;   EYE SURGERY  08/2018   IR EXCHANGE BILIARY DRAIN  05/04/2020   KIDNEY STONE SURGERY     KNEE SURGERY     PACEMAKER INSERTION N/A 07/13/2018   Procedure: INSERTION PACEMAKER-DUEL CHAMBER INITIAL IMPLANT;  Surgeon: Isaias Cowman, MD;  Location: [facility] ORS;  Service: Cardiovascular;  Laterality: N/A;   SPINE SURGERY      There were no vitals filed for this visit.   Subjective Assessment - 10/03/21 1351     Subjective Patient reports "Hanging in there." Caregiver reports he is walking better at home.    Pertinent History Pt referred for deconditioning and gait instability. He has had a decline since he last finished therapy. Pt had a hospital admission from 05/02/20 to 05/14/20 for acute cholecystitis and is now s/p cholecystostomy drain placement. Drain is still in place and pt is waiting to hear when they plan to remove it. Pt discharged to SNF and then returned home. In addition pt underwent radiation therapy for a RUL lung mass/cancer and had his last radiation treatment in September of 2021. He reports that he has continued to walk at home as his primary exercise. Pt was previously seen at this clinic for PT/OT due to weakness/imbalance s/p CVA. Prior history from 09/21/19: Pt is a 87 year old  male who presents with imbalance and difficulty with gait following a right frontal CVA with left hemiparesis on 05/22/19.  He has completed inpatient and home health PT, and now presents for OP PT.  He was discharged from inpatient rehab at a supervision level of assistance, ambulating with a RW.  His family notes that he is impulsive and will try to ambulate without his RW at home.  Family is currently providing 24/7 supervision.    Currently in Pain? No/denies    Pain Onset More than a month ago             Interventions:   Nustep interval training: LE only  1 min - L0 30 sec - L5 1 min - L0 30 sec- L5 1 min-L0 30 sec - L5 1 min 30 sec at  L0 Total distance = 0.23 mi    Gait with SPC  x 100 feet  x 3 trials  Gait speed = 0.47 using SPC Gait with SPC and (arm in arm on opp side)  x 100 feet     Matrix cable system scap retraction (standing) 2 sets of 12  at 12.5 lb  Matrix cable system shoulder ext (standing) 2 sets of 12 at 12.5 lb  Blue Theraband horizontal should ABD (seated)  2 sets of 12   D2 PNF shoulder flex GTB  (seated) 2 sets of 12 reps  Bilateral Shoulder ER (seated) 2 sets of 12 reps   Education provided throughout session via VC/TC and demonstration to facilitate movement at target joints and correct muscle activation for all testing and exercises performed.   Clinical Impression: Patient presents as very motivated today- responsive to all VC for posture and gait sequencing. He performed better with gait and able to improve with no significant antalgia and improved upright posture exhibited today. He was able to walk with reciprocal steps today without significant difficulty. He will benefit from further skilled PT to improve BLE strength, balance and functional mobility for ability to participate in future granddaughter wedding and overall improved quality of life.                 PT Education - 10/03/21 1353     Education Details Exercise form    Person(s) Educated Patient    Methods Explanation;Demonstration;Tactile cues;Verbal cues    Comprehension Verbalized understanding;Returned demonstration;Verbal cues required;Need further instruction;Tactile cues required              PT Short Term Goals - 01/21/21 1438       PT SHORT TERM GOAL #1   Title Pt will be independent with HEP in order to improve strength and balance in order to decrease fall risk and improve function at home.    Baseline 01/02/2021- Patient verbalize understanding of walking and seated exercises for home program. 01/21/21 patient has been more active and understands HEP.    Time 6    Period Weeks    Status  Achieved    Target Date 01/10/21               PT Long Term Goals - 09/03/21 1405       PT LONG TERM GOAL #1   Title Pt will improve BERG by at least 3 points in order to demonstrate clinically significant improvement in balance.    Baseline 09/20/20: 32/56 (Previous discharge: 03/06/20: 46/56), 12/14: 32/56, 1/13: 31/56, 01/16/21 35/56, 02/06/2021=36/56. 02/18/2021= 37/56.03/20/2021- Deferred secondary to left knee aching- will attempt next visit. 05/06/2021= 39/56; 05/13/2021=  36/56 *patient having more left knee pain. 07/16/2021= 40/56    Time 12    Period Weeks    Status Achieved      PT LONG TERM GOAL #2   Title Pt will decrease TUG to below 14 seconds/decrease in order to demonstrate decreased fall risk.    Baseline 09/20/20: 19.2s, 12/14: 17.48, 1/13: 16.65 sec. 01/02/2021=15.75 sec using 4WW, 01/21/21  14.37 seconds. 02/06/2021= 14.92 sec using 4WW. 02/18/2021= 14.5 sec using 4WW. 03/20/2021- Deferred secondary to left knee aching- will attempt next visit. 05/06/2021= 16.32 sec avg using 4WW. 05/13/2021= 16.5 sec with 4WW *increased left knee pain. 06/11/21: 20.11 seconds (increased time to turn) using 4WW;  08/13/2021- Deferred testing secondary to patient not feeling well (recovering from recent bout of COVID). Will test next 1-2 sessions. 09/03/2021= 16.83 sec using 4WW    Time 12    Period Weeks    Status On-going    Target Date 11/05/21      PT LONG TERM GOAL #3   Title Pt will decrease 5TSTS by at least 3 seconds in order to demonstrate clinically significant improvement in LE strength.    Baseline 09/20/20: 23.1s with minA+1 for strength and balance (Previous discharge 03/06/20: 16.1s), 12/14: 20 sec, 1/13: 22 sec.   01/02/2021= 16.05 sec with light UE support. 01/21/21 01/21/21 37.90 seconds without UE, with UE support 12.93 seconds: 02/06/2021= with 1 UE 17.93 sec. 02/18/2021= 21.33 sec without UE support. 05/06/2021= 15.3 sec with BUE Support and 21. 0 sec without UE support. 05/13/2021=15.0 sec with  BUE support; 09/03/2021= 16 sec without UE support.    Time 12    Period Weeks    Status Achieved      PT LONG TERM GOAL #4   Title Pt will increase his self-selected 10MWT to >0.74 m/s in order to demonstrate improvement in gait speed and improved community ambulation    Baseline 09/20/20: self-selected: 14.4s = 0.69 m/s with rollator (Previous discharge 03/06/20: self-selected: 13.6s = 0.74 m/s), 12/14: 0.75 m/s, 1/13: 0.78 m/s. 01/02/2021= 0.85 m/s using 4WW, 01/16/21 with 4 WW .99 m/s. 02/06/2021= self selected = 0.93 m/s with 6BH    Time 4    Period Weeks    Status Achieved      PT LONG TERM GOAL #5   Title Patient will increase six minute walk test distance to >1000 for progression to community ambulator and improve gait ability    Baseline 01/10/20  620 ft with rollator, 01/21/21  965 ft; 02/06/2021= 910 feet with 4WW.  02/18/2021= 950 feet with 4WW. 05/06/2021= will test next visit. 05/13/2021- Deferred secondary to increased left knee pain. 06/11/21: 730ft with 4LP *knee pain throughout test. 07/11/2021= 900 feet with use of 4WW.  08/13/2021- Deferred testing secondary to patient not feeling well (recovering from recent bout of COVID). Will test next 1-2 sessions. 09/03/2021= 750 feet with use of 4WW (recovering from COVID and strained left hamstring)    Time 12    Period Weeks    Status On-going    Target Date 11/05/21      PT LONG TERM GOAL #6   Title Patient will demonstrate ability to walk with HHA +1 > 200 ft. without loss of balance on level surfaces to simulate walking his grandaughter down the aisle for her upcoming wedding in November 2022.    Baseline 05/13/2021- Patient currently ambulating short community distance with use of 4WW but desires to walk with his grandaughter with HHA for her wedding. 06/11/21: attempted  5 steps with HHA x1, deemed unsafe. 07/16/2021- Patient ambulated around 100 feet with HHA (arms interlocked) with PT to simulate walking daughter down aisle.  08/13/2021- Deferred  testing secondary to patient not feeling well (recovering from recent bout of COVID). Will test next 1-2 sessions.    Time 12    Period Weeks    Status On-going    Target Date 11/05/21      PT LONG TERM GOAL #7   Title Pt will decrease 5TSTS  to 14 sec or less with BUE support on armrest in order to demonstrate clinically significant improvement in LE strength.    Baseline 05/13/2021=15.0 sec with BUE support; 26 sec without UE support. 06/11/21: 14.61 seconds with BUE support. 07/16/2021= 23 sec without UE support. 08/13/2021= 24. 2 sec with BUE support. 09/03/2021= 20.97 sec with B UE support - Limited by left hamstring pain.    Time 12    Period Weeks    Status On-going    Target Date 11/05/21                   Plan - 10/03/21 1354     Clinical Impression Statement Patient presents as very motivated today- responsive to all VC for posture and gait sequencing. He performed better with gait and able to improve with no significant antalgia and improved upright posture exhibited today. He was able to walk with reciprocal steps today without significant difficulty. He will benefit from further skilled PT to improve BLE strength, balance and functional mobility for ability to participate in future granddaughter wedding and overall improved quality of life.    Personal Factors and Comorbidities Age;Comorbidity 3+    Comorbidities HTN, CVA    Examination-Activity Limitations Bend;Lift;Squat;Locomotion Level;Stairs;Stand    Examination-Participation Restrictions Community Activity;Driving;Laundry;Meal Prep;Shop    Stability/Clinical Decision Making Unstable/Unpredictable    Rehab Potential Fair    PT Frequency 2x / week    PT Duration 12 weeks    PT Treatment/Interventions ADLs/Self Care Home Management;Cryotherapy;Electrical Stimulation;Iontophoresis 4mg /ml Dexamethasone;Moist Heat;Traction;Ultrasound;DME Instruction;Gait training;Stair training;Therapeutic activities;Therapeutic  exercise;Functional mobility training;Balance training;Neuromuscular re-education;Patient/family education;Manual techniques;Dry needling;Vestibular;Joint Manipulations;Spinal Manipulations;Aquatic Therapy    PT Next Visit Plan Continue to focus on LE strengthening and balance, dynamic balance tasks    PT Home Exercise Plan no changes    Consulted and Agree with Plan of Care Patient;Family member/caregiver    Family Member Consulted Son             Patient will benefit from skilled therapeutic intervention in order to improve the following deficits and impairments:  Abnormal gait, Decreased balance, Decreased mobility, Difficulty walking, Decreased knowledge of precautions, Decreased safety awareness, Decreased activity tolerance, Decreased strength, Decreased endurance  Visit Diagnosis: Abnormality of gait and mobility  Difficulty in walking, not elsewhere classified  Muscle weakness (generalized)  Unsteadiness on feet     Problem List Patient Active Problem List   Diagnosis Date Noted   Localized osteoarthritis of knees, bilateral 06/13/2021   Moderate protein-calorie malnutrition ([place]) 03/11/2021   Hemiparesis affecting left side as late effect of cerebrovascular accident (CVA) ([place]) 03/11/2021   Aneurysm, aorta, thoracic 12/21/2020   Calculus of bile duct without cholecystitis and without obstruction    Non-small cell carcinoma of lung, right ([place]) 09/07/2020   Mass of upper lobe of right lung 07/13/2020   Calculus of bile duct with cholecystitis without obstruction    Abnormal findings on imaging of biliary tract    Acute cholecystitis 05/02/2020   Chronic venous insufficiency 12/05/2019  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    CKD (chronic kidney disease), stage II    New onset atrial fibrillation (H[facility])    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   Goals of care, counseling/discussion    Palliative care by specialist    DNR (do not resuscitate)  discussion    History of CVA (cerebrovascular accident) 05/22/2019   Persistent proteinuria 07/30/2018   Mobitz type 2 second degree heart block 07/13/2018   Chronic kidney disease, stage III (moderate) ([place]) 06/15/2018   Leg pain 05/06/2018   Lymphedema 05/06/2018   Coagulopathy ([place]) 07/07/2017   Lumbar spondylosis 03/10/2017   Elevated uric acid in blood 11/20/2016   BPH (benign prostatic hyperplasia) 05/29/2016   Peripheral vascular disease of lower extremity ([place]) 03/03/2016   Gallstone 12/31/2015   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   Atherosclerosis of aorta ([place]) 11/22/2015   Carotid artery narrowing 11/22/2015   Diverticulosis of colon 11/22/2015   Decreased creatinine clearance 11/22/2015   Arthritis, degenerative 11/22/2015   Lactose intolerance 11/22/2015   Basal cell carcinoma 11/22/2015   Essential hypertension 06/21/2015   Hyperlipemia 06/21/2015   GERD (gastroesophageal reflux disease) 06/21/2015   Calculus of kidney 11/18/2013    Lewis Moccasin, PT 10/03/2021, 4:43 PM  [place] MAIN [facility] SERVICES [address] [place], [place], [address] Phone: [phone]   Fax:  [phone]  Name: Jose Castro MRN: [id number] Date of Birth: [birth year]

## 2021-10-08 ENCOUNTER — Other Ambulatory Visit: Payer: Self-pay

## 2021-10-08 ENCOUNTER — Ambulatory Visit: Payer: Medicare Other

## 2021-10-08 DIAGNOSIS — M6281 Muscle weakness (generalized): Secondary | ICD-10-CM

## 2021-10-08 DIAGNOSIS — R269 Unspecified abnormalities of gait and mobility: Secondary | ICD-10-CM | POA: Diagnosis not present

## 2021-10-08 DIAGNOSIS — R2681 Unsteadiness on feet: Secondary | ICD-10-CM

## 2021-10-08 DIAGNOSIS — R262 Difficulty in walking, not elsewhere classified: Secondary | ICD-10-CM

## 2021-10-09 NOTE — Therapy (Signed)
[place] MAIN [facility] SERVICES [address] [place], [place], [address] Phone: [phone]   Fax:  [phone]  Physical Therapy Treatment/Physical Therapy Progress Note   Dates of reporting period 09/03/2021 to   10/08/2021  Patient Details  Name: Jose Castro MRN: [id number] Date of Birth: [birth year] No data recorded  Encounter Date: 10/08/2021   PT End of Session - 10/08/21 0834     Visit Number 70    Number of Visits 87    Date for PT Re-Evaluation 11/05/21    Authorization Type [employer] Medicare    Authorization Time Period 08/13/2021- 11/05/2021; PN on 10/08/2021    Progress Note Due on Visit 70    PT Start Time 1345    PT Stop Time 1428    PT Time Calculation (min) 43 min    Equipment Utilized During Treatment Gait belt    Activity Tolerance Patient tolerated treatment well;No increased pain    Behavior During Therapy WFL for tasks assessed/performed             Past Medical History:  Diagnosis Date   Allergy    Penicillin   Arthritis    Basal cell carcinoma    BPH (benign prostatic hyperplasia)    Chronic kidney disease    had a kidney stone which per family was a cyst that was removed   Dysrhythmia    GERD (gastroesophageal reflux disease)    History of kidney stones    Hyperlipidemia    Hypertension    Left-sided low back pain with left-sided sciatica    Lung mass    Splenic vein thrombosis    Stroke ([place]) 05/22/2019   Thrombosis 12/2015   mural  area and no notation of heart attack    Past Surgical History:  Procedure Laterality Date   APPENDECTOMY     CATARACT EXTRACTION, BILATERAL     CYSTOSCOPY WITH INSERTION OF UROLIFT     ENDOSCOPIC RETROGRADE CHOLANGIOPANCREATOGRAPHY (ERCP) WITH PROPOFOL N/A 05/11/2020   Procedure: ENDOSCOPIC RETROGRADE CHOLANGIOPANCREATOGRAPHY (ERCP) WITH PROPOFOL;  Surgeon: [name], MD;  Location: [facility] ENDOSCOPY;  Service: Endoscopy;  Laterality: N/A;   ERCP N/A 10/23/2020    Procedure: ENDOSCOPIC RETROGRADE CHOLANGIOPANCREATOGRAPHY (ERCP);  Surgeon: [name], MD;  Location: [facility] ENDOSCOPY;  Service: Endoscopy;  Laterality: N/A;   EYE SURGERY  08/2018   IR EXCHANGE BILIARY DRAIN  05/04/2020   KIDNEY STONE SURGERY     KNEE SURGERY     PACEMAKER INSERTION N/A 07/13/2018   Procedure: INSERTION PACEMAKER-DUEL CHAMBER INITIAL IMPLANT;  Surgeon: Isaias Cowman, MD;  Location: [facility] ORS;  Service: Cardiovascular;  Laterality: N/A;   SPINE SURGERY      There were no vitals filed for this visit.   Subjective Assessment - 10/08/21 1357     Subjective Patient reports doing well with no new complaints today. "I am going to vote after I leave here."    Pertinent History Pt referred for deconditioning and gait instability. He has had a decline since he last finished therapy. Pt had a hospital admission from 05/02/20 to 05/14/20 for acute cholecystitis and is now s/p cholecystostomy drain placement. Drain is still in place and pt is waiting to hear when they plan to remove it. Pt discharged to SNF and then returned home. In addition pt underwent radiation therapy for a RUL lung mass/cancer and had his last radiation treatment in September of 2021. He reports that he has continued to walk at home as his primary exercise.  Pt was previously seen at this clinic for PT/OT due to weakness/imbalance s/p CVA. Prior history from 09/21/19: Pt is a 87 year old male who presents with imbalance and difficulty with gait following a right frontal CVA with left hemiparesis on 05/22/19.  He has completed inpatient and home health PT, and now presents for OP PT.  He was discharged from inpatient rehab at a supervision level of assistance, ambulating with a RW.  His family notes that he is impulsive and will try to ambulate without his RW at home.  Family is currently providing 24/7 supervision.    Currently in Pain? Yes    Pain Score 4     Pain Location Knee    Pain Orientation Left    Pain  Descriptors / Indicators Aching    Pain Type Chronic pain    Pain Onset More than a month ago    Pain Frequency Constant    Aggravating Factors  Transfers, Prolonged walking    Pain Relieving Factors Rest, wearing knee brace    Effect of Pain on Daily Activities Limited Standing ADLs; difficulty with transfers; limited ability to walk    Multiple Pain Sites No                  Interventions:    Nustep interval training: LE only   1 min - L0 30 sec - L5 1 min - L0 30 sec- L5 - patient rates RPE= 5/10 1 min-L0 30 sec - L5 1 min -L0 30 sec - L5 1 min L0 Total distance = 0.30  6 min walk test= 890 feet with use of 4WW- VC not to drag left foot. Patient was distracted in gym stopping and talking a few times limiting his performance- requiring redirection.   5x STS= 19.0 sec with minimal BUE Support  Gait with HHA  on right and use of SPC on left- To simulate walking granddaughter down the aisle- approx 220 feet today total- with CGA and progressing with no report of pain.      Clinical Impression: Patient demonstrating improved functional mobility versus last assessment. He has recovered from recent weakness associated with COVID- he demo progress with both 5xSTS and functional endurance 6 min walk today. He is also doing well progressing toward his personal goal of walking granddaughter down the aisle using HHA and cane with no report of pain and no LOB. Patient's condition has the potential to improve in response to therapy. Maximum improvement is yet to be obtained. The anticipated improvement is attainable and reasonable in a generally predictable time.                 PT Education - 10/09/21 0834     Education Details Exercise technique    Person(s) Educated Patient    Methods Explanation;Demonstration;Tactile cues;Verbal cues    Comprehension Verbalized understanding;Returned demonstration;Verbal cues required;Need further instruction;Tactile cues  required              PT Short Term Goals - 01/21/21 1438       PT SHORT TERM GOAL #1   Title Pt will be independent with HEP in order to improve strength and balance in order to decrease fall risk and improve function at home.    Baseline 01/02/2021- Patient verbalize understanding of walking and seated exercises for home program. 01/21/21 patient has been more active and understands HEP.    Time 6    Period Weeks    Status Achieved    Target Date  01/10/21               PT Long Term Goals - 10/09/21 0948       PT LONG TERM GOAL #1   Title Pt will improve BERG by at least 3 points in order to demonstrate clinically significant improvement in balance.    Baseline 09/20/20: 32/56 (Previous discharge: 03/06/20: 46/56), 12/14: 32/56, 1/13: 31/56, 01/16/21 35/56, 02/06/2021=36/56. 02/18/2021= 37/56.03/20/2021- Deferred secondary to left knee aching- will attempt next visit. 05/06/2021= 39/56; 05/13/2021= 36/56 *patient having more left knee pain. 07/16/2021= 40/56    Time 12    Period Weeks    Status Achieved      PT LONG TERM GOAL #2   Title Pt will decrease TUG to below 14 seconds/decrease in order to demonstrate decreased fall risk.    Baseline 09/20/20: 19.2s, 12/14: 17.48, 1/13: 16.65 sec. 01/02/2021=15.75 sec using 4WW, 01/21/21  14.37 seconds. 02/06/2021= 14.92 sec using 4WW. 02/18/2021= 14.5 sec using 4WW. 03/20/2021- Deferred secondary to left knee aching- will attempt next visit. 05/06/2021= 16.32 sec avg using 4WW. 05/13/2021= 16.5 sec with 4WW *increased left knee pain. 06/11/21: 20.11 seconds (increased time to turn) using 4WW;  08/13/2021- Deferred testing secondary to patient not feeling well (recovering from recent bout of COVID). Will test next 1-2 sessions. 09/03/2021= 16.83 sec using 4WW    Time 12    Period Weeks    Status On-going    Target Date 11/05/21      PT LONG TERM GOAL #3   Title Pt will decrease 5TSTS by at least 3 seconds in order to demonstrate clinically significant  improvement in LE strength.    Baseline 09/20/20: 23.1s with minA+1 for strength and balance (Previous discharge 03/06/20: 16.1s), 12/14: 20 sec, 1/13: 22 sec.   01/02/2021= 16.05 sec with light UE support. 01/21/21 01/21/21 37.90 seconds without UE, with UE support 12.93 seconds: 02/06/2021= with 1 UE 17.93 sec. 02/18/2021= 21.33 sec without UE support. 05/06/2021= 15.3 sec with BUE Support and 21. 0 sec without UE support. 05/13/2021=15.0 sec with BUE support; 09/03/2021= 16 sec without UE support.    Time 12    Period Weeks    Status Achieved      PT LONG TERM GOAL #4   Title Pt will increase his self-selected 10MWT to >0.74 m/s in order to demonstrate improvement in gait speed and improved community ambulation    Baseline 09/20/20: self-selected: 14.4s = 0.69 m/s with rollator (Previous discharge 03/06/20: self-selected: 13.6s = 0.74 m/s), 12/14: 0.75 m/s, 1/13: 0.78 m/s. 01/02/2021= 0.85 m/s using 4WW, 01/16/21 with 4 WW .99 m/s. 02/06/2021= self selected = 0.93 m/s with 7OH    Time 4    Period Weeks    Status Achieved      PT LONG TERM GOAL #5   Title Patient will increase six minute walk test distance to >1000 for progression to community ambulator and improve gait ability    Baseline 01/10/20  620 ft with rollator, 01/21/21  965 ft; 02/06/2021= 910 feet with 4WW.  02/18/2021= 950 feet with 4WW. 05/06/2021= will test next visit. 05/13/2021- Deferred secondary to increased left knee pain. 06/11/21: 718ft with 6WV *knee pain throughout test. 07/11/2021= 900 feet with use of 4WW.  08/13/2021- Deferred testing secondary to patient not feeling well (recovering from recent bout of COVID). Will test next 1-2 sessions. 09/03/2021= 750 feet with use of 4WW (recovering from COVID and strained left hamstring); 10/08/2021=890 feet with use of 4WW- VC not to drag left  foot. Patient was distracted in gym stopping and talking a few times limiting his performance- requiring redirection.    Time 12    Period Weeks    Status On-going     Target Date 11/05/21      PT LONG TERM GOAL #6   Title Patient will demonstrate ability to walk with HHA +1 > 200 ft. without loss of balance on level surfaces to simulate walking his grandaughter down the aisle for her upcoming wedding in November 2022.    Baseline 05/13/2021- Patient currently ambulating short community distance with use of 4WW but desires to walk with his grandaughter with HHA for her wedding. 06/11/21: attempted 5 steps with HHA x1, deemed unsafe. 07/16/2021- Patient ambulated around 100 feet with HHA (arms interlocked) with PT to simulate walking daughter down aisle.  08/13/2021- Deferred testing secondary to patient not feeling well (recovering from recent bout of COVID). Will test next 1-2 sessions.  10/08/2021=Gait with HHA  on right and use of SPC on left- To simulate walking granddaughter down the aisle- approx 220 feet today total- with CGA and progressing with no report of pain.    Time 12    Period Weeks    Status On-going    Target Date 11/05/21      PT LONG TERM GOAL #7   Title Pt will decrease 5TSTS  to 14 sec or less with BUE support on armrest in order to demonstrate clinically significant improvement in LE strength.    Baseline 05/13/2021=15.0 sec with BUE support; 26 sec without UE support. 06/11/21: 14.61 seconds with BUE support. 07/16/2021= 23 sec without UE support. 08/13/2021= 24. 2 sec with BUE  09/03/2021= 20.97 sec with B UE support - Limited by left hamstring pain. 10/08/2021-19.0 sec with minimal BUE Support  upport.    Time 12    Period Weeks    Status On-going    Target Date 11/05/21                   Plan - 10/08/21 0835     Clinical Impression Statement Patient demonstrating improved functional mobility versus last assessment. He has recovered from recent weakness associated with COVID- he demo progress with both 5xSTS and functional endurance 6 min walk today. He is also doing well progressing toward his personal goal of walking granddaughter  down the aisle using HHA and cane with no report of pain and no LOB. Patient's condition has the potential to improve in response to therapy. Maximum improvement is yet to be obtained. The anticipated improvement is attainable and reasonable in a generally predictable time.    Personal Factors and Comorbidities Age;Comorbidity 3+    Comorbidities HTN, CVA    Examination-Activity Limitations Bend;Lift;Squat;Locomotion Level;Stairs;Stand    Examination-Participation Restrictions Community Activity;Driving;Laundry;Meal Prep;Shop    Stability/Clinical Decision Making Unstable/Unpredictable    Rehab Potential Fair    PT Frequency 2x / week    PT Duration 12 weeks    PT Treatment/Interventions ADLs/Self Care Home Management;Cryotherapy;Electrical Stimulation;Iontophoresis 4mg /ml Dexamethasone;Moist Heat;Traction;Ultrasound;DME Instruction;Gait training;Stair training;Therapeutic activities;Therapeutic exercise;Functional mobility training;Balance training;Neuromuscular re-education;Patient/family education;Manual techniques;Dry needling;Vestibular;Joint Manipulations;Spinal Manipulations;Aquatic Therapy    PT Next Visit Plan Continue to focus on LE strengthening and balance, dynamic balance tasks    PT Home Exercise Plan no changes    Consulted and Agree with Plan of Care Patient;Family member/caregiver    Family Member Consulted Son             Patient will benefit from skilled therapeutic intervention in order  to improve the following deficits and impairments:  Abnormal gait, Decreased balance, Decreased mobility, Difficulty walking, Decreased knowledge of precautions, Decreased safety awareness, Decreased activity tolerance, Decreased strength, Decreased endurance  Visit Diagnosis: Abnormality of gait and mobility  Difficulty in walking, not elsewhere classified  Muscle weakness (generalized)  Unsteadiness on feet     Problem List Patient Active Problem List   Diagnosis Date Noted    Localized osteoarthritis of knees, bilateral 06/13/2021   Moderate protein-calorie malnutrition ([place]) 03/11/2021   Hemiparesis affecting left side as late effect of cerebrovascular accident (CVA) ([place]) 03/11/2021   Aneurysm, aorta, thoracic 12/21/2020   Calculus of bile duct without cholecystitis and without obstruction    Non-small cell carcinoma of lung, right ([place]) 09/07/2020   Mass of upper lobe of right lung 07/13/2020   Calculus of bile duct with cholecystitis without obstruction    Abnormal findings on imaging of biliary tract    Acute cholecystitis 05/02/2020   Chronic venous insufficiency 12/05/2019   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    CKD (chronic kidney disease), stage II    New onset atrial fibrillation ([place])    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   Goals of care, counseling/discussion    Palliative care by specialist    DNR (do not resuscitate) discussion    History of CVA (cerebrovascular accident) 05/22/2019   Persistent proteinuria 07/30/2018   M[place]itz type 2 second degree heart block 07/13/2018   Chronic kidney disease, stage III (moderate) ([place]) 06/15/2018   Leg pain 05/06/2018   Lymphedema 05/06/2018   Coagulopathy ([place]) 07/07/2017   Lumbar spondylosis 03/10/2017   Elevated uric acid in blood 11/20/2016   BPH (benign prostatic hyperplasia) 05/29/2016   Peripheral vascular disease of lower extremity ([place]) 03/03/2016   Gallstone 12/31/2015   Splenic infarct 12/31/2015   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   Atherosclerosis of aorta ([place]) 11/22/2015   Carotid artery narrowing 11/22/2015   Diverticulosis of colon 11/22/2015   Decreased creatinine clearance 11/22/2015   Arthritis, degenerative 11/22/2015   Lactose intolerance 11/22/2015   Basal cell carcinoma 11/22/2015   Essential hypertension 06/21/2015   Hyperlipemia 06/21/2015   GERD (gastroesophageal reflux disease) 06/21/2015   Calculus of kidney 11/18/2013    Lewis Moccasin, PT 10/09/2021, 9:58 AM  [place] MAIN [facility] SERVICES [address] [place], [place], [address] Phone: [phone]   Fax:  [phone]  Name: MUZAMIL HARKER MRN: [id number] Date of Birth: [birth year]

## 2021-10-10 ENCOUNTER — Ambulatory Visit: Payer: Medicare Other

## 2021-10-15 ENCOUNTER — Other Ambulatory Visit: Payer: Self-pay

## 2021-10-15 ENCOUNTER — Ambulatory Visit: Payer: Medicare Other

## 2021-10-15 DIAGNOSIS — R2681 Unsteadiness on feet: Secondary | ICD-10-CM

## 2021-10-15 DIAGNOSIS — M6281 Muscle weakness (generalized): Secondary | ICD-10-CM

## 2021-10-15 DIAGNOSIS — R269 Unspecified abnormalities of gait and mobility: Secondary | ICD-10-CM | POA: Diagnosis not present

## 2021-10-15 DIAGNOSIS — R262 Difficulty in walking, not elsewhere classified: Secondary | ICD-10-CM

## 2021-10-15 NOTE — Therapy (Signed)
[place] MAIN [facility] SERVICES [address] [place], [place], [address] Phone: [phone]   Fax:  [phone]  Physical Therapy Treatment  Patient Details  Name: Jose Castro MRN: [id number] Date of Birth: 06-22-1926 No data recorded  Encounter Date: 10/15/2021   PT End of Session - 10/15/21 1423     Visit Number 71    Number of Visits 34    Date for PT Re-Evaluation 11/05/21    Authorization Type U[employer] Medicare    Authorization Time Period 08/13/2021- 11/05/2021; PN on 10/08/2021    Progress Note Due on Visit 70    PT Start Time 1347    PT Stop Time 1428    PT Time Calculation (min) 41 min    Equipment Utilized During Treatment Gait belt    Activity Tolerance Patient tolerated treatment well;No increased pain    Behavior During Therapy WFL for tasks assessed/performed             Past Medical History:  Diagnosis Date   Allergy    Penicillin   Arthritis    Basal cell carcinoma    BPH (benign prostatic hyperplasia)    Chronic kidney disease    had a kidney stone which per family was a cyst that was removed   Dysrhythmia    GERD (gastroesophageal reflux disease)    History of kidney stones    Hyperlipidemia    Hypertension    Left-sided low back pain with left-sided sciatica    Lung mass    Splenic vein thrombosis    Stroke ([place]) 05/22/2019   Thrombosis 12/2015   mural  area and no notation of heart attack    Past Surgical History:  Procedure Laterality Date   APPENDECTOMY     CATARACT EXTRACTION, BILATERAL     CYSTOSCOPY WITH INSERTION OF UROLIFT     ENDOSCOPIC RETROGRADE CHOLANGIOPANCREATOGRAPHY (ERCP) WITH PROPOFOL N/A 05/11/2020   Procedure: ENDOSCOPIC RETROGRADE CHOLANGIOPANCREATOGRAPHY (ERCP) WITH PROPOFOL;  Surgeon: [name], MD;  Location: [facility] ENDOSCOPY;  Service: Endoscopy;  Laterality: N/A;   ERCP N/A 10/23/2020   Procedure: ENDOSCOPIC RETROGRADE CHOLANGIOPANCREATOGRAPHY (ERCP);  Surgeon: [name], MD;   Location: [facility] ENDOSCOPY;  Service: Endoscopy;  Laterality: N/A;   EYE SURGERY  08/2018   IR EXCHANGE BILIARY DRAIN  05/04/2020   KIDNEY STONE SURGERY     KNEE SURGERY     PACEMAKER INSERTION N/A 07/13/2018   Procedure: INSERTION PACEMAKER-DUEL CHAMBER INITIAL IMPLANT;  Surgeon: Isaias Cowman, MD;  Location: [facility] ORS;  Service: Cardiovascular;  Laterality: N/A;   SPINE SURGERY      There were no vitals filed for this visit.   Subjective Assessment - 10/15/21 1421     Subjective Patient reports receiving his knee injection last week and doing pretty good today. He brought his granddaughter to PT to practice walking her down the aisle prior to her wedding on Saturday.    Pertinent History Pt referred for deconditioning and gait instability. He has had a decline since he last finished therapy. Pt had a hospital admission from 05/02/20 to 05/14/20 for acute cholecystitis and is now s/p cholecystostomy drain placement. Drain is still in place and pt is waiting to hear when they plan to remove it. Pt discharged to SNF and then returned home. In addition pt underwent radiation therapy for a RUL lung mass/cancer and had his last radiation treatment in September of 2021. He reports that he has continued to walk at home as his primary exercise.  Pt was previously seen at this clinic for PT/OT due to weakness/imbalance s/p CVA. Prior history from 09/21/19: Pt is a 87 year old male who presents with imbalance and difficulty with gait following a right frontal CVA with left hemiparesis on 05/22/19.  He has completed inpatient and home health PT, and now presents for OP PT.  He was discharged from inpatient rehab at a supervision level of assistance, ambulating with a RW.  His family notes that he is impulsive and will try to ambulate without his RW at home.  Family is currently providing 24/7 supervision.    Currently in Pain? No/denies    Pain Onset More than a month ago              Interventions:    Gait in // bars - using 1 UE support x length of bars x 4 trials- focusing on increasing step length.  No report of any pain during walk.   Gait sequencing activity- using GTB- (heel to toe with resistive swing phase) 2 sets of 10 reps each LE.   Standing scap retract - using GTB 2 sets of 12 reps - VC for erect posture.   Seated hamstring curls- GTB 2 sets of 12 reps  Sit to stand with min BUE x 8 reps- Cues to lean forward  Standing Lumbar ext (from forward trunk flexed to standing erect)  2 sets of 12 reps. - Patient reported fatigue as limiting factor.   Ambulation with use of hurrycane on right side and elbow lock with Granddaughter > 200 feet total- exhibiting good reciprocal steps without report of pain and granddaughter states he was not placing hardly any weight though her side and she was pleased stating he was walking better than the last time in session that they walked together.   Education provided throughout session via VC/TC and demonstration to facilitate movement at target joints and correct muscle activation for all testing and exercises performed.   Clinical Impression: Patient performed well again today- motivated in anticipation of upcoming wedding of Granddaughter. She was also well pleased with his balance and ability to walk today. He denied any pain and able to walk well with use of cane and HHA today. He will benefit from further skilled PT to improve BLE strength, balance and functional mobility for ability to participate in future granddaughter wedding and overall improved quality of life.                     PT Education - 10/16/21 1422     Education Details Safety education with gait; Exercise technique    Person(s) Educated Patient    Methods Explanation;Demonstration;Tactile cues;Verbal cues    Comprehension Verbalized understanding;Returned demonstration;Verbal cues required;Need further instruction              PT Short Term Goals  - 01/21/21 1438       PT SHORT TERM GOAL #1   Title Pt will be independent with HEP in order to improve strength and balance in order to decrease fall risk and improve function at home.    Baseline 01/02/2021- Patient verbalize understanding of walking and seated exercises for home program. 01/21/21 patient has been more active and understands HEP.    Time 6    Period Weeks    Status Achieved    Target Date 01/10/21               PT Long Term Goals - 10/09/21 3329  PT LONG TERM GOAL #1   Title Pt will improve BERG by at least 3 points in order to demonstrate clinically significant improvement in balance.    Baseline 09/20/20: 32/56 (Previous discharge: 03/06/20: 46/56), 12/14: 32/56, 1/13: 31/56, 01/16/21 35/56, 02/06/2021=36/56. 02/18/2021= 37/56.03/20/2021- Deferred secondary to left knee aching- will attempt next visit. 05/06/2021= 39/56; 05/13/2021= 36/56 *patient having more left knee pain. 07/16/2021= 40/56    Time 12    Period Weeks    Status Achieved      PT LONG TERM GOAL #2   Title Pt will decrease TUG to below 14 seconds/decrease in order to demonstrate decreased fall risk.    Baseline 09/20/20: 19.2s, 12/14: 17.48, 1/13: 16.65 sec. 01/02/2021=15.75 sec using 4WW, 01/21/21  14.37 seconds. 02/06/2021= 14.92 sec using 4WW. 02/18/2021= 14.5 sec using 4WW. 03/20/2021- Deferred secondary to left knee aching- will attempt next visit. 05/06/2021= 16.32 sec avg using 4WW. 05/13/2021= 16.5 sec with 4WW *increased left knee pain. 06/11/21: 20.11 seconds (increased time to turn) using 4WW;  08/13/2021- Deferred testing secondary to patient not feeling well (recovering from recent bout of COVID). Will test next 1-2 sessions. 09/03/2021= 16.83 sec using 4WW    Time 12    Period Weeks    Status On-going    Target Date 11/05/21      PT LONG TERM GOAL #3   Title Pt will decrease 5TSTS by at least 3 seconds in order to demonstrate clinically significant improvement in LE strength.    Baseline 09/20/20:  23.1s with minA+1 for strength and balance (Previous discharge 03/06/20: 16.1s), 12/14: 20 sec, 1/13: 22 sec.   01/02/2021= 16.05 sec with light UE support. 01/21/21 01/21/21 37.90 seconds without UE, with UE support 12.93 seconds: 02/06/2021= with 1 UE 17.93 sec. 02/18/2021= 21.33 sec without UE support. 05/06/2021= 15.3 sec with BUE Support and 21. 0 sec without UE support. 05/13/2021=15.0 sec with BUE support; 09/03/2021= 16 sec without UE support.    Time 12    Period Weeks    Status Achieved      PT LONG TERM GOAL #4   Title Pt will increase his self-selected 10MWT to >0.74 m/s in order to demonstrate improvement in gait speed and improved community ambulation    Baseline 09/20/20: self-selected: 14.4s = 0.69 m/s with rollator (Previous discharge 03/06/20: self-selected: 13.6s = 0.74 m/s), 12/14: 0.75 m/s, 1/13: 0.78 m/s. 01/02/2021= 0.85 m/s using 4WW, 01/16/21 with 4 WW .99 m/s. 02/06/2021= self selected = 0.93 m/s with 9OB    Time 4    Period Weeks    Status Achieved      PT LONG TERM GOAL #5   Title Patient will increase six minute walk test distance to >1000 for progression to community ambulator and improve gait ability    Baseline 01/10/20  620 ft with rollator, 01/21/21  965 ft; 02/06/2021= 910 feet with 4WW.  02/18/2021= 950 feet with 4WW. 05/06/2021= will test next visit. 05/13/2021- Deferred secondary to increased left knee pain. 06/11/21: 735ft with 0JG *knee pain throughout test. 07/11/2021= 900 feet with use of 4WW.  08/13/2021- Deferred testing secondary to patient not feeling well (recovering from recent bout of COVID). Will test next 1-2 sessions. 09/03/2021= 750 feet with use of 4WW (recovering from COVID and strained left hamstring); 10/08/2021=890 feet with use of 4WW- VC not to drag left foot. Patient was distracted in gym stopping and talking a few times limiting his performance- requiring redirection.    Time 12    Period Weeks  Status On-going    Target Date 11/05/21      PT LONG TERM GOAL #6    Title Patient will demonstrate ability to walk with HHA +1 > 200 ft. without loss of balance on level surfaces to simulate walking his grandaughter down the aisle for her upcoming wedding in November 2022.    Baseline 05/13/2021- Patient currently ambulating short community distance with use of 4WW but desires to walk with his grandaughter with HHA for her wedding. 06/11/21: attempted 5 steps with HHA x1, deemed unsafe. 07/16/2021- Patient ambulated around 100 feet with HHA (arms interlocked) with PT to simulate walking daughter down aisle.  08/13/2021- Deferred testing secondary to patient not feeling well (recovering from recent bout of COVID). Will test next 1-2 sessions.  10/08/2021=Gait with HHA  on right and use of SPC on left- To simulate walking granddaughter down the aisle- approx 220 feet today total- with CGA and progressing with no report of pain.    Time 12    Period Weeks    Status On-going    Target Date 11/05/21      PT LONG TERM GOAL #7   Title Pt will decrease 5TSTS  to 14 sec or less with BUE support on armrest in order to demonstrate clinically significant improvement in LE strength.    Baseline 05/13/2021=15.0 sec with BUE support; 26 sec without UE support. 06/11/21: 14.61 seconds with BUE support. 07/16/2021= 23 sec without UE support. 08/13/2021= 24. 2 sec with BUE  09/03/2021= 20.97 sec with B UE support - Limited by left hamstring pain. 10/08/2021-19.0 sec with minimal BUE Support  upport.    Time 12    Period Weeks    Status On-going    Target Date 11/05/21                   Plan - 10/15/21 1431     Clinical Impression Statement Patient performed well again today- motivated in anticipation of upcoming wedding of Granddaughter. She was also well pleased with his balance and ability to walk today. He denied any pain and able to walk well with use of cane and HHA today. He will benefit from further skilled PT to improve BLE strength, balance and functional mobility for  ability to participate in future granddaughter wedding and overall improved quality of life.    Personal Factors and Comorbidities Age;Comorbidity 3+    Comorbidities HTN, CVA    Examination-Activity Limitations Bend;Lift;Squat;Locomotion Level;Stairs;Stand    Examination-Participation Restrictions Community Activity;Driving;Laundry;Meal Prep;Shop    Stability/Clinical Decision Making Unstable/Unpredictable    Rehab Potential Fair    PT Frequency 2x / week    PT Duration 12 weeks    PT Treatment/Interventions ADLs/Self Care Home Management;Cryotherapy;Electrical Stimulation;Iontophoresis 4mg /ml Dexamethasone;Moist Heat;Traction;Ultrasound;DME Instruction;Gait training;Stair training;Therapeutic activities;Therapeutic exercise;Functional mobility training;Balance training;Neuromuscular re-education;Patient/family education;Manual techniques;Dry needling;Vestibular;Joint Manipulations;Spinal Manipulations;Aquatic Therapy    PT Next Visit Plan Continue to focus on LE strengthening and balance, dynamic balance tasks    PT Home Exercise Plan no changes    Consulted and Agree with Plan of Care Patient;Family member/caregiver    Family Member Consulted Son             Patient will benefit from skilled therapeutic intervention in order to improve the following deficits and impairments:  Abnormal gait, Decreased balance, Decreased mobility, Difficulty walking, Decreased knowledge of precautions, Decreased safety awareness, Decreased activity tolerance, Decreased strength, Decreased endurance  Visit Diagnosis: Abnormality of gait and mobility  Difficulty in walking, not elsewhere classified  Muscle  weakness (generalized)  Unsteadiness on feet     Problem List Patient Active Problem List   Diagnosis Date Noted   Localized osteoarthritis of knees, bilateral 06/13/2021   Moderate protein-calorie malnutrition (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    CKD (chronic kidney disease), stage II    New onset atrial fibrillation ([place])    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   Goals of care, counseling/discussion    Palliative care by specialist    DNR (do not resuscitate) discussion    History of CVA (cerebrovascular accident) 05/22/2019   Persistent proteinuria 07/30/2018   Mobitz type 2 second degree heart block 07/13/2018   Chronic kidney disease, stage III (moderate) ([place]) 06/15/2018   Leg pain 05/06/2018   Lymphedema 05/06/2018   Coagulopathy ([place]) 07/07/2017   Lumbar spondylosis 03/10/2017   Elevated uric acid in blood 11/20/2016   BPH (benign prostatic hyperplasia) 05/29/2016   Peripheral vascular disease of lower extremity ([place]) 03/03/2016   Gallstone 12/31/2015   Splenic infarct 12/31/2015   Splenic vein thrombosis 11/27/2015   Atherosclerosis of aorta ([place]) 11/22/2015   Carotid artery narrowing 11/22/2015   Diverticulosis of colon 11/22/2015   Decreased creatinine clearance 11/22/2015   Arthritis, degenerative 11/22/2015   Lactose intolerance 11/22/2015   Basal cell carcinoma 11/22/2015   Essential hypertension 06/21/2015   Hyperlipemia 06/21/2015   GERD (gastroesophageal reflux disease) 06/21/2015   Calculus of kidney 11/18/2013    Lewis Moccasin, PT 10/16/2021, 2:47 PM  [facility] [address] [place], [place], [address] Phone: [phone]    Fax:  [phone]  Name: Jose Castro MRN: [id number] Date of Birth: 10/12/1926

## 2021-10-17 ENCOUNTER — Ambulatory Visit: Payer: Medicare Other

## 2021-10-17 ENCOUNTER — Other Ambulatory Visit: Payer: Self-pay

## 2021-10-17 DIAGNOSIS — R269 Unspecified abnormalities of gait and mobility: Secondary | ICD-10-CM

## 2021-10-17 DIAGNOSIS — M6281 Muscle weakness (generalized): Secondary | ICD-10-CM

## 2021-10-17 DIAGNOSIS — R2681 Unsteadiness on feet: Secondary | ICD-10-CM

## 2021-10-17 DIAGNOSIS — R262 Difficulty in walking, not elsewhere classified: Secondary | ICD-10-CM

## 2021-10-17 NOTE — Therapy (Signed)
[place] MAIN [facility] SERVICES [address] [place], [place], [address] Phone: [phone]   Fax:  [phone]  Physical Therapy Treatment  Patient Details  Name: Jose Castro MRN: [id number] Date of Birth: [birth year] No data recorded  Encounter Date: 10/17/2021   PT End of Session - 10/17/21 2250     Visit Number 72    Number of Visits 51    Date for PT Re-Evaluation 11/05/21    Authorization Type [employer] Medicare    Authorization Time Period 08/13/2021- 11/05/2021; PN on 10/08/2021    Progress Note Due on Visit 70    PT Start Time 1350    PT Stop Time 1422    PT Time Calculation (min) 32 min    Equipment Utilized During Treatment Gait belt    Activity Tolerance Patient tolerated treatment well;No increased pain    Behavior During Therapy WFL for tasks assessed/performed             Past Medical History:  Diagnosis Date   Allergy    Penicillin   Arthritis    Basal cell carcinoma    BPH (benign prostatic hyperplasia)    Chronic kidney disease    had a kidney stone which per family was a cyst that was removed   Dysrhythmia    GERD (gastroesophageal reflux disease)    History of kidney stones    Hyperlipidemia    Hypertension    Left-sided low back pain with left-sided sciatica    Lung mass    Splenic vein thrombosis    Stroke ([place]) 05/22/2019   Thrombosis 12/2015   mural  area and no notation of heart attack    Past Surgical History:  Procedure Laterality Date   APPENDECTOMY     CATARACT EXTRACTION, BILATERAL     CYSTOSCOPY WITH INSERTION OF UROLIFT     ENDOSCOPIC RETROGRADE CHOLANGIOPANCREATOGRAPHY (ERCP) WITH PROPOFOL N/A 05/11/2020   Procedure: ENDOSCOPIC RETROGRADE CHOLANGIOPANCREATOGRAPHY (ERCP) WITH PROPOFOL;  Surgeon: [name], MD;  Location: [facility] ENDOSCOPY;  Service: Endoscopy;  Laterality: N/A;   ERCP N/A 10/23/2020   Procedure: ENDOSCOPIC RETROGRADE CHOLANGIOPANCREATOGRAPHY (ERCP);  Surgeon: [name], MD;   Location: [facility] ENDOSCOPY;  Service: Endoscopy;  Laterality: N/A;   EYE SURGERY  08/2018   IR EXCHANGE BILIARY DRAIN  05/04/2020   KIDNEY STONE SURGERY     KNEE SURGERY     PACEMAKER INSERTION N/A 07/13/2018   Procedure: INSERTION PACEMAKER-DUEL CHAMBER INITIAL IMPLANT;  Surgeon: Isaias Cowman, MD;  Location: [facility] ORS;  Service: Cardiovascular;  Laterality: N/A;   SPINE SURGERY      There were no vitals filed for this visit.   Subjective Assessment - 10/17/21 2247     Subjective Patient reports doing well and states no new issues. States he feels ready for the upcoming wedding this weekend and denies any pain today.    Pertinent History Pt referred for deconditioning and gait instability. He has had a decline since he last finished therapy. Pt had a hospital admission from 05/02/20 to 05/14/20 for acute cholecystitis and is now s/p cholecystostomy drain placement. Drain is still in place and pt is waiting to hear when they plan to remove it. Pt discharged to SNF and then returned home. In addition pt underwent radiation therapy for a RUL lung mass/cancer and had his last radiation treatment in September of 2021. He reports that he has continued to walk at home as his primary exercise. Pt was previously seen at this clinic for  PT/OT due to weakness/imbalance s/p CVA. Prior history from 09/21/19: Pt is a 88 year old male who presents with imbalance and difficulty with gait following a right frontal CVA with left hemiparesis on 05/22/19.  He has completed inpatient and home health PT, and now presents for OP PT.  He was discharged from inpatient rehab at a supervision level of assistance, ambulating with a RW.  His family notes that he is impulsive and will try to ambulate without his RW at home.  Family is currently providing 24/7 supervision.    Currently in Pain? No/denies    Pain Onset More than a month ago             INTERVENTIONS:   Level 0 for 6 min for cardiovascular endurance-  monitoring RPE - did not exceed 6/10   Standing lumbar flex into ext to focus on posture - 2 sets of 10 reps- Patient reports as "Medium"   Static stand without UE x 2 min x 2 trials- focusing on upright posture.   Walking up/down // bars with 1 UE support x 3 trial (length of bars and back) with short reciprocal steps and no reported pain.   Simulation of upcoming wedding- Walking down the aisle practice - 100 feet with granddaughter using cane on right side and arm on granddaughter- No cues for upright posture-today and no loss of balance or report of pain.   Clinical Impression: Patient presents with excellent motivation today and able to walk in // bars and later with grandaughter with more erect posture and no dragging of left foot. Patient was released early today as he was feeling good and denied pain. Decided to defer any further treatment today as patient was experiencing a really good day and ambulated well and wanted to keep him fresh for his upcoming wedding event. His granddaughter stated that was the best she has seen him walk in quite a while and very pleased with his progress. He will benefit from further skilled PT to improve BLE strength, balance and functional mobility for ability to participate in future granddaughter wedding and overall improved quality of life                             PT Education - 10/17/21 2249     Education Details Exercise technique    Person(s) Educated Patient    Methods Explanation;Demonstration;Tactile cues;Verbal cues    Comprehension Verbalized understanding;Returned demonstration;Verbal cues required;Need further instruction;Tactile cues required              PT Short Term Goals - 01/21/21 1438       PT SHORT TERM GOAL #1   Title Pt will be independent with HEP in order to improve strength and balance in order to decrease fall risk and improve function at home.    Baseline 01/02/2021- Patient verbalize  understanding of walking and seated exercises for home program. 01/21/21 patient has been more active and understands HEP.    Time 6    Period Weeks    Status Achieved    Target Date 01/10/21               PT Long Term Goals - 10/09/21 0948       PT LONG TERM GOAL #1   Title Pt will improve BERG by at least 3 points in order to demonstrate clinically significant improvement in balance.    Baseline 09/20/20: 32/56 (Previous discharge: 03/06/20: 46/56), 12/14:  32/56, 1/13: 31/56, 01/16/21 35/56, 02/06/2021=36/56. 02/18/2021= 37/56.03/20/2021- Deferred secondary to left knee aching- will attempt next visit. 05/06/2021= 39/56; 05/13/2021= 36/56 *patient having more left knee pain. 07/16/2021= 40/56    Time 12    Period Weeks    Status Achieved      PT LONG TERM GOAL #2   Title Pt will decrease TUG to below 14 seconds/decrease in order to demonstrate decreased fall risk.    Baseline 09/20/20: 19.2s, 12/14: 17.48, 1/13: 16.65 sec. 01/02/2021=15.75 sec using 4WW, 01/21/21  14.37 seconds. 02/06/2021= 14.92 sec using 4WW. 02/18/2021= 14.5 sec using 4WW. 03/20/2021- Deferred secondary to left knee aching- will attempt next visit. 05/06/2021= 16.32 sec avg using 4WW. 05/13/2021= 16.5 sec with 4WW *increased left knee pain. 06/11/21: 20.11 seconds (increased time to turn) using 4WW;  08/13/2021- Deferred testing secondary to patient not feeling well (recovering from recent bout of COVID). Will test next 1-2 sessions. 09/03/2021= 16.83 sec using 4WW    Time 12    Period Weeks    Status On-going    Target Date 11/05/21      PT LONG TERM GOAL #3   Title Pt will decrease 5TSTS by at least 3 seconds in order to demonstrate clinically significant improvement in LE strength.    Baseline 09/20/20: 23.1s with minA+1 for strength and balance (Previous discharge 03/06/20: 16.1s), 12/14: 20 sec, 1/13: 22 sec.   01/02/2021= 16.05 sec with light UE support. 01/21/21 01/21/21 37.90 seconds without UE, with UE support 12.93 seconds:  02/06/2021= with 1 UE 17.93 sec. 02/18/2021= 21.33 sec without UE support. 05/06/2021= 15.3 sec with BUE Support and 21. 0 sec without UE support. 05/13/2021=15.0 sec with BUE support; 09/03/2021= 16 sec without UE support.    Time 12    Period Weeks    Status Achieved      PT LONG TERM GOAL #4   Title Pt will increase his self-selected 10MWT to >0.74 m/s in order to demonstrate improvement in gait speed and improved community ambulation    Baseline 09/20/20: self-selected: 14.4s = 0.69 m/s with rollator (Previous discharge 03/06/20: self-selected: 13.6s = 0.74 m/s), 12/14: 0.75 m/s, 1/13: 0.78 m/s. 01/02/2021= 0.85 m/s using 4WW, 01/16/21 with 4 WW .99 m/s. 02/06/2021= self selected = 0.93 m/s with 4MG    Time 4    Period Weeks    Status Achieved      PT LONG TERM GOAL #5   Title Patient will increase six minute walk test distance to >1000 for progression to community ambulator and improve gait ability    Baseline 01/10/20  620 ft with rollator, 01/21/21  965 ft; 02/06/2021= 910 feet with 4WW.  02/18/2021= 950 feet with 4WW. 05/06/2021= will test next visit. 05/13/2021- Deferred secondary to increased left knee pain. 06/11/21: 779ft with 8QP *knee pain throughout test. 07/11/2021= 900 feet with use of 4WW.  08/13/2021- Deferred testing secondary to patient not feeling well (recovering from recent bout of COVID). Will test next 1-2 sessions. 09/03/2021= 750 feet with use of 4WW (recovering from COVID and strained left hamstring); 10/08/2021=890 feet with use of 4WW- VC not to drag left foot. Patient was distracted in gym stopping and talking a few times limiting his performance- requiring redirection.    Time 12    Period Weeks    Status On-going    Target Date 11/05/21      PT LONG TERM GOAL #6   Title Patient will demonstrate ability to walk with HHA +1 > 200 ft. without loss  of balance on level surfaces to simulate walking his grandaughter down the aisle for her upcoming wedding in November 2022.    Baseline  05/13/2021- Patient currently ambulating short community distance with use of 4WW but desires to walk with his grandaughter with HHA for her wedding. 06/11/21: attempted 5 steps with HHA x1, deemed unsafe. 07/16/2021- Patient ambulated around 100 feet with HHA (arms interlocked) with PT to simulate walking daughter down aisle.  08/13/2021- Deferred testing secondary to patient not feeling well (recovering from recent bout of COVID). Will test next 1-2 sessions.  10/08/2021=Gait with HHA  on right and use of SPC on left- To simulate walking granddaughter down the aisle- approx 220 feet today total- with CGA and progressing with no report of pain.    Time 12    Period Weeks    Status On-going    Target Date 11/05/21      PT LONG TERM GOAL #7   Title Pt will decrease 5TSTS  to 14 sec or less with BUE support on armrest in order to demonstrate clinically significant improvement in LE strength.    Baseline 05/13/2021=15.0 sec with BUE support; 26 sec without UE support. 06/11/21: 14.61 seconds with BUE support. 07/16/2021= 23 sec without UE support. 08/13/2021= 24. 2 sec with BUE  09/03/2021= 20.97 sec with B UE support - Limited by left hamstring pain. 10/08/2021-19.0 sec with minimal BUE Support  upport.    Time 12    Period Weeks    Status On-going    Target Date 11/05/21                   Plan - 10/17/21 2250     Clinical Impression Statement Patient presents with excellent motivation today and able to walk in // bars and later with grandaughter with more erect posture and no dragging of left foot. Patient was released early today as he was feeling good and denied pain. Decided to defer any further treatment today as patient was experiencing a really good day and ambulated well and wanted to keep him fresh for his upcoming wedding event. His granddaughter stated that was the best she has seen him walk in quite a while and very pleased with his progress. He will benefit from further skilled PT to  improve BLE strength, balance and functional mobility for ability to participate in future granddaughter wedding and overall improved quality of life    Personal Factors and Comorbidities Age;Comorbidity 3+    Comorbidities HTN, CVA    Examination-Activity Limitations Bend;Lift;Squat;Locomotion Level;Stairs;Stand    Examination-Participation Restrictions Community Activity;Driving;Laundry;Meal Prep;Shop    Stability/Clinical Decision Making Unstable/Unpredictable    Rehab Potential Fair    PT Frequency 2x / week    PT Duration 12 weeks    PT Treatment/Interventions ADLs/Self Care Home Management;Cryotherapy;Electrical Stimulation;Iontophoresis 4mg /ml Dexamethasone;Moist Heat;Traction;Ultrasound;DME Instruction;Gait training;Stair training;Therapeutic activities;Therapeutic exercise;Functional mobility training;Balance training;Neuromuscular re-education;Patient/family education;Manual techniques;Dry needling;Vestibular;Joint Manipulations;Spinal Manipulations;Aquatic Therapy    PT Next Visit Plan Continue to focus on LE strengthening and balance, dynamic balance tasks    PT Home Exercise Plan no changes    Consulted and Agree with Plan of Care Patient;Family member/caregiver    Family Member Consulted Son             Patient will benefit from skilled therapeutic intervention in order to improve the following deficits and impairments:  Abnormal gait, Decreased balance, Decreased mobility, Difficulty walking, Decreased knowledge of precautions, Decreased safety awareness, Decreased activity tolerance, Decreased strength, Decreased endurance  Visit Diagnosis:  Abnormality of gait and mobility  Difficulty in walking, not elsewhere classified  Muscle weakness (generalized)  Unsteadiness on feet     Problem List Patient Active Problem List   Diagnosis Date Noted   Localized osteoarthritis of knees, bilateral 06/13/2021   Moderate protein-calorie malnutrition ([place]) 03/11/2021    Hemiparesis affecting left side as late effect of cerebrovascular accident (CVA) ([place]) 03/11/2021   Aneurysm, aorta, thoracic 12/21/2020   Calculus of bile duct without cholecystitis and without obstruction    Non-small cell carcinoma of lung, right ([place]) 09/07/2020   Mass of upper lobe of right lung 07/13/2020   Calculus of bile duct with cholecystitis without obstruction    Abnormal findings on imaging of biliary tract    Acute cholecystitis 05/02/2020   Chronic venous insufficiency 12/05/2019   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    CKD (chronic kidney disease), stage II    New onset atrial fibrillation ([place])    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   Goals of care, counseling/discussion    Palliative care by specialist    DNR (do not resuscitate) discussion    History of CVA (cerebrovascular accident) 05/22/2019   Persistent proteinuria 07/30/2018   Mobitz type 2 second degree heart block 07/13/2018   Chronic kidney disease, stage III (moderate) ([place]) 06/15/2018   Leg pain 05/06/2018   Lymphedema 05/06/2018   Coagulopathy ([place]) 07/07/2017   Lumbar spondylosis 03/10/2017   Elevated uric acid in blood 11/20/2016   BPH (benign prostatic hyperplasia) 05/29/2016   Peripheral vascular disease of lower extremity ([place]) 03/03/2016   Gallstone 12/31/2015   Splenic infarct 12/31/2015   Splenic vein thrombosis 11/27/2015   Atherosclerosis of aorta ([place]) 11/22/2015   Carotid artery narrowing 11/22/2015   Diverticulosis of colon 11/22/2015   Decreased creatinine clearance 11/22/2015   Arthritis, degenerative 11/22/2015   Lactose intolerance 11/22/2015   Basal cell carcinoma 11/22/2015   Essential hypertension 06/21/2015   Hyperlipemia 06/21/2015   GERD (gastroesophageal reflux disease) 06/21/2015   Calculus of kidney 11/18/2013    Lewis Moccasin, PT 10/17/2021, 11:03 PM  [place] MAIN [facility] SERVICES [address] [place], [place], [address] Phone: [phone]   Fax:  [phone]  Name: Jose Castro MRN: [id number] Date of Birth: 1926-03-10

## 2021-10-22 ENCOUNTER — Ambulatory Visit: Payer: Medicare Other

## 2021-10-22 ENCOUNTER — Other Ambulatory Visit: Payer: Self-pay

## 2021-10-22 DIAGNOSIS — R269 Unspecified abnormalities of gait and mobility: Secondary | ICD-10-CM

## 2021-10-22 DIAGNOSIS — R262 Difficulty in walking, not elsewhere classified: Secondary | ICD-10-CM

## 2021-10-22 DIAGNOSIS — R2681 Unsteadiness on feet: Secondary | ICD-10-CM

## 2021-10-22 DIAGNOSIS — M6281 Muscle weakness (generalized): Secondary | ICD-10-CM

## 2021-10-22 NOTE — Therapy (Signed)
[place] MAIN [facility] SERVICES [address] [place], [place], [address] Phone: [phone]   Fax:  [phone]  Physical Therapy Treatment  Patient Details  Name: Jose Castro MRN: [id number] Date of Birth: 05/15/1926 No data recorded  Encounter Date: 10/22/2021   PT End of Session - 10/22/21 1459     Visit Number 31    Number of Visits 52    Date for PT Re-Evaluation 11/05/21    Authorization Type UHC Medicare    Authorization Time Period 08/13/2021- 11/05/2021; PN on 10/08/2021    Progress Note Due on Visit 70    PT Start Time 1342    PT Stop Time 1420    PT Time Calculation (min) 38 min    Equipment Utilized During Treatment Gait belt    Activity Tolerance Patient tolerated treatment well;No increased pain    Behavior During Therapy WFL for tasks assessed/performed             Past Medical History:  Diagnosis Date   Allergy    Penicillin   Arthritis    Basal cell carcinoma    BPH (benign prostatic hyperplasia)    Chronic kidney disease    had a kidney stone which per family was a cyst that was removed   Dysrhythmia    GERD (gastroesophageal reflux disease)    History of kidney stones    Hyperlipidemia    Hypertension    Left-sided low back pain with left-sided sciatica    Lung mass    Splenic vein thrombosis    Stroke ([place]) 05/22/2019   Thrombosis 12/2015   mural  area and no notation of heart attack    Past Surgical History:  Procedure Laterality Date   APPENDECTOMY     CATARACT EXTRACTION, BILATERAL     CYSTOSCOPY WITH INSERTION OF UROLIFT     ENDOSCOPIC RETROGRADE CHOLANGIOPANCREATOGRAPHY (ERCP) WITH PROPOFOL N/A 05/11/2020   Procedure: ENDOSCOPIC RETROGRADE CHOLANGIOPANCREATOGRAPHY (ERCP) WITH PROPOFOL;  Surgeon: [name], MD;  Location: [facility] ENDOSCOPY;  Service: Endoscopy;  Laterality: N/A;   ERCP N/A 10/23/2020   Procedure: ENDOSCOPIC RETROGRADE CHOLANGIOPANCREATOGRAPHY (ERCP);  Surgeon: [name], MD;   Location: [facility] ENDOSCOPY;  Service: Endoscopy;  Laterality: N/A;   EYE SURGERY  08/2018   IR EXCHANGE BILIARY DRAIN  05/04/2020   KIDNEY STONE SURGERY     KNEE SURGERY     PACEMAKER INSERTION N/A 07/13/2018   Procedure: INSERTION PACEMAKER-DUEL CHAMBER INITIAL IMPLANT;  Surgeon: Isaias Cowman, MD;  Location: [facility] ORS;  Service: Cardiovascular;  Laterality: N/A;   SPINE SURGERY      There were no vitals filed for this visit.   Subjective Assessment - 10/22/21 1347     Subjective Patient reports the wedding went very well without issues.    Pertinent History Pt referred for deconditioning and gait instability. He has had a decline since he last finished therapy. Pt had a hospital admission from 05/02/20 to 05/14/20 for acute cholecystitis and is now s/p cholecystostomy drain placement. Drain is still in place and pt is waiting to hear when they plan to remove it. Pt discharged to SNF and then returned home. In addition pt underwent radiation therapy for a RUL lung mass/cancer and had his last radiation treatment in September of 2021. He reports that he has continued to walk at home as his primary exercise. Pt was previously seen at this clinic for PT/OT due to weakness/imbalance s/p CVA. Prior history from 09/21/19: Pt is a 87 year old male  who presents with imbalance and difficulty with gait following a right frontal CVA with left hemiparesis on 05/22/19.  He has completed inpatient and home health PT, and now presents for OP PT.  He was discharged from inpatient rehab at a supervision level of assistance, ambulating with a RW.  His family notes that he is impulsive and will try to ambulate without his RW at home.  Family is currently providing 24/7 supervision.    Currently in Pain? Yes    Pain Onset More than a month ago              INTERVENTIONS:   Standing high knee march x 15 reps BLE- Patient reports "the higher I go the harder it is." Standing calf raises x 15 reps BLE- Reports as  easy to medium- VC to keep knees extended.  Sit to stand x 12 reps with min BUE support - Reports as medium side step up and over green therapad x 15 reps with BUE on support bar- Patient reports as Medium    Small green TheraBall on wall- x 15 reps with VC to look up for improved posture. Patient able to respond to [facility] and perform without report of pain but constant cues to maintain posture.   Seated scap retract with BTB- 2 sets of 12 reps- VC to squeeze shoulder blades- Patient able to row with arm but minimal scapular movement.   Standing shoulder ext with BTB 2 sets of 12 reps- VC for posture and correct technique.   Standing against wall- attempting erect posture- patient initially with difficulty- attaining position max cues to look ahead - move shoulders back toward wall. Patient then added a PVC and required mod assist to lift overhead toward wall with 5 sec stretch x 15 reps x 2 sets.   Added a pool noddle behind patient against wall and patient performed initially static standing but progressed to scap retraction x 15 reps.  Education provided throughout session via VC/TC and demonstration to facilitate movement at target joints and correct muscle activation for all testing and exercises performed.    Clinical Impression: Patient challenged today with standing posture exercises but responsive to VC and able to return demo with improving ability/posture. Patient fatigued with session yet verbalized no pain and able to complete 1-2 sets with minimal rest. . He will benefit from further skilled PT to improve BLE strength, balance and functional mobility and overall improved quality of life.                    PT Short Term Goals - 01/21/21 1438       PT SHORT TERM GOAL #1   Title Pt will be independent with HEP in order to improve strength and balance in order to decrease fall risk and improve function at home.    Baseline 01/02/2021- Patient verbalize understanding of  walking and seated exercises for home program. 01/21/21 patient has been more active and understands HEP.    Time 6    Period Weeks    Status Achieved    Target Date 01/10/21               PT Long Term Goals - 10/09/21 0948       PT LONG TERM GOAL #1   Title Pt will improve BERG by at least 3 points in order to demonstrate clinically significant improvement in balance.    Baseline 09/20/20: 32/56 (Previous discharge: 03/06/20: 46/56), 12/14: 32/56, 1/13: 31/56, 01/16/21  35/56, 02/06/2021=36/56. 02/18/2021= 37/56.03/20/2021- Deferred secondary to left knee aching- will attempt next visit. 05/06/2021= 39/56; 05/13/2021= 36/56 *patient having more left knee pain. 07/16/2021= 40/56    Time 12    Period Weeks    Status Achieved      PT LONG TERM GOAL #2   Title Pt will decrease TUG to below 14 seconds/decrease in order to demonstrate decreased fall risk.    Baseline 09/20/20: 19.2s, 12/14: 17.48, 1/13: 16.65 sec. 01/02/2021=15.75 sec using 4WW, 01/21/21  14.37 seconds. 02/06/2021= 14.92 sec using 4WW. 02/18/2021= 14.5 sec using 4WW. 03/20/2021- Deferred secondary to left knee aching- will attempt next visit. 05/06/2021= 16.32 sec avg using 4WW. 05/13/2021= 16.5 sec with 4WW *increased left knee pain. 06/11/21: 20.11 seconds (increased time to turn) using 4WW;  08/13/2021- Deferred testing secondary to patient not feeling well (recovering from recent bout of COVID). Will test next 1-2 sessions. 09/03/2021= 16.83 sec using 4WW    Time 12    Period Weeks    Status On-going    Target Date 11/05/21      PT LONG TERM GOAL #3   Title Pt will decrease 5TSTS by at least 3 seconds in order to demonstrate clinically significant improvement in LE strength.    Baseline 09/20/20: 23.1s with minA+1 for strength and balance (Previous discharge 03/06/20: 16.1s), 12/14: 20 sec, 1/13: 22 sec.   01/02/2021= 16.05 sec with light UE support. 01/21/21 01/21/21 37.90 seconds without UE, with UE support 12.93 seconds: 02/06/2021= with 1 UE  17.93 sec. 02/18/2021= 21.33 sec without UE support. 05/06/2021= 15.3 sec with BUE Support and 21. 0 sec without UE support. 05/13/2021=15.0 sec with BUE support; 09/03/2021= 16 sec without UE support.    Time 12    Period Weeks    Status Achieved      PT LONG TERM GOAL #4   Title Pt will increase his self-selected 10MWT to >0.74 m/s in order to demonstrate improvement in gait speed and improved community ambulation    Baseline 09/20/20: self-selected: 14.4s = 0.69 m/s with rollator (Previous discharge 03/06/20: self-selected: 13.6s = 0.74 m/s), 12/14: 0.75 m/s, 1/13: 0.78 m/s. 01/02/2021= 0.85 m/s using 4WW, 01/16/21 with 4 WW .99 m/s. 02/06/2021= self selected = 0.93 m/s with 7PO    Time 4    Period Weeks    Status Achieved      PT LONG TERM GOAL #5   Title Patient will increase six minute walk test distance to >1000 for progression to community ambulator and improve gait ability    Baseline 01/10/20  620 ft with rollator, 01/21/21  965 ft; 02/06/2021= 910 feet with 4WW.  02/18/2021= 950 feet with 4WW. 05/06/2021= will test next visit. 05/13/2021- Deferred secondary to increased left knee pain. 06/11/21: 711ft with 2UM *knee pain throughout test. 07/11/2021= 900 feet with use of 4WW.  08/13/2021- Deferred testing secondary to patient not feeling well (recovering from recent bout of COVID). Will test next 1-2 sessions. 09/03/2021= 750 feet with use of 4WW (recovering from COVID and strained left hamstring); 10/08/2021=890 feet with use of 4WW- VC not to drag left foot. Patient was distracted in gym stopping and talking a few times limiting his performance- requiring redirection.    Time 12    Period Weeks    Status On-going    Target Date 11/05/21      PT LONG TERM GOAL #6   Title Patient will demonstrate ability to walk with HHA +1 > 200 ft. without loss of balance on level  surfaces to simulate walking his grandaughter down the aisle for her upcoming wedding in November 2022.    Baseline 05/13/2021- Patient currently  ambulating short community distance with use of 4WW but desires to walk with his grandaughter with HHA for her wedding. 06/11/21: attempted 5 steps with HHA x1, deemed unsafe. 07/16/2021- Patient ambulated around 100 feet with HHA (arms interlocked) with PT to simulate walking daughter down aisle.  08/13/2021- Deferred testing secondary to patient not feeling well (recovering from recent bout of COVID). Will test next 1-2 sessions.  10/08/2021=Gait with HHA  on right and use of SPC on left- To simulate walking granddaughter down the aisle- approx 220 feet today total- with CGA and progressing with no report of pain.    Time 12    Period Weeks    Status On-going    Target Date 11/05/21      PT LONG TERM GOAL #7   Title Pt will decrease 5TSTS  to 14 sec or less with BUE support on armrest in order to demonstrate clinically significant improvement in LE strength.    Baseline 05/13/2021=15.0 sec with BUE support; 26 sec without UE support. 06/11/21: 14.61 seconds with BUE support. 07/16/2021= 23 sec without UE support. 08/13/2021= 24. 2 sec with BUE  09/03/2021= 20.97 sec with B UE support - Limited by left hamstring pain. 10/08/2021-19.0 sec with minimal BUE Support  upport.    Time 12    Period Weeks    Status On-going    Target Date 11/05/21                   Plan - 10/22/21 1458     Clinical Impression Statement Patient challenged today with standing posture exercises but responsive to VC and able to return demo with improving ability/posture. Patient fatigued with session yet verbalized no pain and able to complete 1-2 sets with minimal rest. . He will benefit from further skilled PT to improve BLE strength, balance and functional mobility and overall improved quality of life.    Personal Factors and Comorbidities Age;Comorbidity 3+    Comorbidities HTN, CVA    Examination-Activity Limitations Bend;Lift;Squat;Locomotion Level;Stairs;Stand    Examination-Participation Restrictions Community  Activity;Driving;Laundry;Meal Prep;Shop    Stability/Clinical Decision Making Unstable/Unpredictable    Rehab Potential Fair    PT Frequency 2x / week    PT Duration 12 weeks    PT Treatment/Interventions ADLs/Self Care Home Management;Cryotherapy;Electrical Stimulation;Iontophoresis 4mg /ml Dexamethasone;Moist Heat;Traction;Ultrasound;DME Instruction;Gait training;Stair training;Therapeutic activities;Therapeutic exercise;Functional mobility training;Balance training;Neuromuscular re-education;Patient/family education;Manual techniques;Dry needling;Vestibular;Joint Manipulations;Spinal Manipulations;Aquatic Therapy    PT Next Visit Plan Continue to focus on LE strengthening and balance, dynamic balance tasks    PT Home Exercise Plan no changes    Consulted and Agree with Plan of Care Patient;Family member/caregiver    Family Member Consulted Son             Patient will benefit from skilled therapeutic intervention in order to improve the following deficits and impairments:  Abnormal gait, Decreased balance, Decreased mobility, Difficulty walking, Decreased knowledge of precautions, Decreased safety awareness, Decreased activity tolerance, Decreased strength, Decreased endurance  Visit Diagnosis: Abnormality of gait and mobility  Difficulty in walking, not elsewhere classified  Muscle weakness (generalized)  Unsteadiness on feet     Problem List Patient Active Problem List   Diagnosis Date Noted   Localized osteoarthritis of knees, bilateral 06/13/2021   Moderate protein-calorie malnutrition ([place]) 03/11/2021   Hemiparesis affecting left side as late effect of cerebrovascular accident (CVA) ([place]) 03/11/2021  Aneurysm, aorta, thoracic 12/21/2020   Calculus of bile duct without cholecystitis and without obstruction    Non-small cell carcinoma of lung, right ([place]) 09/07/2020   Mass of upper lobe of right lung 07/13/2020   Calculus of bile duct with cholecystitis without  obstruction    Abnormal findings on imaging of biliary tract    Acute cholecystitis 05/02/2020   Chronic venous insufficiency 12/05/2019   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    CKD (chronic kidney disease), stage II    New onset atrial fibrillation ([place])    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   Goals of care, counseling/discussion    Palliative care by specialist    DNR (do not resuscitate) discussion    History of CVA (cerebrovascular accident) 05/22/2019   Persistent proteinuria 07/30/2018   Mobitz type 2 second degree heart block 07/13/2018   Chronic kidney disease, stage III (moderate) ([place]) 06/15/2018   Leg pain 05/06/2018   Lymphedema 05/06/2018   Coagulopathy ([place]) 07/07/2017   Lumbar spondylosis 03/10/2017   Elevated uric acid in blood 11/20/2016   BPH (benign prostatic hyperplasia) 05/29/2016   Peripheral vascular disease of lower extremity ([place]) 03/03/2016   Gallstone 12/31/2015   Splenic infarct 12/31/2015   Splenic vein thrombosis 11/27/2015   Atherosclerosis of aorta ([place]) 11/22/2015   Carotid artery narrowing 11/22/2015   Diverticulosis of colon 11/22/2015   Decreased creatinine clearance 11/22/2015   Arthritis, degenerative 11/22/2015   Lactose intolerance 11/22/2015   Basal cell carcinoma 11/22/2015   Essential hypertension 06/21/2015   Hyperlipemia 06/21/2015   GERD (gastroesophageal reflux disease) 06/21/2015   Calculus of kidney 11/18/2013    Lewis Moccasin, PT 10/22/2021, 3:01 PM  [place] MAIN [facility] SERVICES [address] [place], [place], [address] Phone: [phone]   Fax:  [phone]  Name: Jose Castro MRN: [id number] Date of Birth: 07-27-1926

## 2021-10-29 ENCOUNTER — Other Ambulatory Visit: Payer: Self-pay

## 2021-10-29 ENCOUNTER — Ambulatory Visit: Payer: Medicare Other

## 2021-10-29 DIAGNOSIS — R269 Unspecified abnormalities of gait and mobility: Secondary | ICD-10-CM | POA: Diagnosis not present

## 2021-10-29 DIAGNOSIS — M6281 Muscle weakness (generalized): Secondary | ICD-10-CM

## 2021-10-29 DIAGNOSIS — R2681 Unsteadiness on feet: Secondary | ICD-10-CM

## 2021-10-29 DIAGNOSIS — R262 Difficulty in walking, not elsewhere classified: Secondary | ICD-10-CM

## 2021-10-29 NOTE — Therapy (Signed)
[place] MAIN [facility] SERVICES [address] [place], [place], [address] Phone: [phone]   Fax:  [phone]  Physical Therapy Treatment  Patient Details  Name: Jose Castro MRN: [id number] Date of Birth: August 10, 1926 No data recorded  Encounter Date: 10/29/2021   PT End of Session - 10/29/21 1956     Visit Number 74    Number of Visits 65    Date for PT Re-Evaluation 11/05/21    Authorization Type [employer] Medicare    Authorization Time Period 08/13/2021- 11/05/2021; PN on 10/08/2021    Progress Note Due on Visit 70    PT Start Time 1346    PT Stop Time 1423    PT Time Calculation (min) 37 min    Equipment Utilized During Treatment Gait belt    Activity Tolerance Patient tolerated treatment well;No increased pain    Behavior During Therapy WFL for tasks assessed/performed             Past Medical History:  Diagnosis Date   Allergy    Penicillin   Arthritis    Basal cell carcinoma    BPH (benign prostatic hyperplasia)    Chronic kidney disease    had a kidney stone which per family was a cyst that was removed   Dysrhythmia    GERD (gastroesophageal reflux disease)    History of kidney stones    Hyperlipidemia    Hypertension    Left-sided low back pain with left-sided sciatica    Lung mass    Splenic vein thrombosis    Stroke ([place]) 05/22/2019   Thrombosis 12/2015   mural  area and no notation of heart attack    Past Surgical History:  Procedure Laterality Date   APPENDECTOMY     CATARACT EXTRACTION, BILATERAL     CYSTOSCOPY WITH INSERTION OF UROLIFT     ENDOSCOPIC RETROGRADE CHOLANGIOPANCREATOGRAPHY (ERCP) WITH PROPOFOL N/A 05/11/2020   Procedure: ENDOSCOPIC RETROGRADE CHOLANGIOPANCREATOGRAPHY (ERCP) WITH PROPOFOL;  Surgeon: [name], MD;  Location: [facility] ENDOSCOPY;  Service: Endoscopy;  Laterality: N/A;   ERCP N/A 10/23/2020   Procedure: ENDOSCOPIC RETROGRADE CHOLANGIOPANCREATOGRAPHY (ERCP);  Surgeon: [name], MD;   Location: [facility] ENDOSCOPY;  Service: Endoscopy;  Laterality: N/A;   EYE SURGERY  08/2018   IR EXCHANGE BILIARY DRAIN  05/04/2020   KIDNEY STONE SURGERY     KNEE SURGERY     PACEMAKER INSERTION N/A 07/13/2018   Procedure: INSERTION PACEMAKER-DUEL CHAMBER INITIAL IMPLANT;  Surgeon: Isaias Cowman, MD;  Location: [facility] ORS;  Service: Cardiovascular;  Laterality: N/A;   SPINE SURGERY      There were no vitals filed for this visit.   Subjective Assessment - 10/29/21 1404     Subjective Patient reports falling last Friday in hallway and not exactly sure how but states  his walker landing on top of him. He denied going to MD or ED and states mostly just sore on left side with difficulty with transfers. He reports taking Tylenol and still walking around with no further falls since    Pertinent History Pt referred for deconditioning and gait instability. He has had a decline since he last finished therapy. Pt had a hospital admission from 05/02/20 to 05/14/20 for acute cholecystitis and is now s/p cholecystostomy drain placement. Drain is still in place and pt is waiting to hear when they plan to remove it. Pt discharged to SNF and then returned home. In addition pt underwent radiation therapy for a RUL lung mass/cancer and had  his last radiation treatment in September of 2021. He reports that he has continued to walk at home as his primary exercise. Pt was previously seen at this clinic for PT/OT due to weakness/imbalance s/p CVA. Prior history from 09/21/19: Pt is a 87 year old male who presents with imbalance and difficulty with gait following a right frontal CVA with left hemiparesis on 05/22/19.  He has completed inpatient and home health PT, and now presents for OP PT.  He was discharged from inpatient rehab at a supervision level of assistance, ambulating with a RW.  His family notes that he is impulsive and will try to ambulate without his RW at home.  Family is currently providing 24/7 supervision.     Currently in Pain? Yes    Pain Score 7     Pain Location Back    Pain Orientation Left;Posterior    Pain Descriptors / Indicators Aching    Pain Type Acute pain    Pain Onset In the past 7 days    Pain Frequency Constant    Aggravating Factors  transfers from supine to sit and sit to stand    Pain Relieving Factors rest, Meds, heat              INTERVENTIONS     Manual therapy:  Obeservation of low back - No bruising/redness- Point tender throughout lumbar paraspinals and SI joint region.   Manual stretching- gentle passive ROM - Knee to chest, lower trunk rotation, B hamstring, Hip circles/IR/ER- x 20 min total while patient laying supine (hooklye) with moist heat to low back. *Patient reported decreased back pain after heat and ROM activities- did not rate however.    Therapeutic Exercises:  Instruction in active ROM/stretching:  Active Lower trunk rotation knee to chest sitting lumbar flex with theraball 20 reps each side Walking with 4WW approx 100 feet in clinic to remain active- Patient did not report a  Instruction in safe bed mobility using log roll technique- Patient able to perform well and states still painful but not as bad as the way he has been getting up.  Education provided throughout session via VC/TC and demonstration to facilitate movement at target joints and correct muscle activation for all testing and exercises performed.   .                PT Education - 10/29/21 1956     Education Details Pain relief education    Person(s) Educated Patient    Methods Explanation;Demonstration;Tactile cues;Verbal cues    Comprehension Verbalized understanding;Returned demonstration;Verbal cues required;Need further instruction;Tactile cues required              PT Short Term Goals - 01/21/21 1438       PT SHORT TERM GOAL #1   Title Pt will be independent with HEP in order to improve strength and balance in order to decrease fall risk  and improve function at home.    Baseline 01/02/2021- Patient verbalize understanding of walking and seated exercises for home program. 01/21/21 patient has been more active and understands HEP.    Time 6    Period Weeks    Status Achieved    Target Date 01/10/21               PT Long Term Goals - 10/09/21 0948       PT LONG TERM GOAL #1   Title Pt will improve BERG by at least 3 points in order to demonstrate clinically significant improvement in  balance.    Baseline 09/20/20: 32/56 (Previous discharge: 03/06/20: 46/56), 12/14: 32/56, 1/13: 31/56, 01/16/21 35/56, 02/06/2021=36/56. 02/18/2021= 37/56.03/20/2021- Deferred secondary to left knee aching- will attempt next visit. 05/06/2021= 39/56; 05/13/2021= 36/56 *patient having more left knee pain. 07/16/2021= 40/56    Time 12    Period Weeks    Status Achieved      PT LONG TERM GOAL #2   Title Pt will decrease TUG to below 14 seconds/decrease in order to demonstrate decreased fall risk.    Baseline 09/20/20: 19.2s, 12/14: 17.48, 1/13: 16.65 sec. 01/02/2021=15.75 sec using 4WW, 01/21/21  14.37 seconds. 02/06/2021= 14.92 sec using 4WW. 02/18/2021= 14.5 sec using 4WW. 03/20/2021- Deferred secondary to left knee aching- will attempt next visit. 05/06/2021= 16.32 sec avg using 4WW. 05/13/2021= 16.5 sec with 4WW *increased left knee pain. 06/11/21: 20.11 seconds (increased time to turn) using 4WW;  08/13/2021- Deferred testing secondary to patient not feeling well (recovering from recent bout of COVID). Will test next 1-2 sessions. 09/03/2021= 16.83 sec using 4WW    Time 12    Period Weeks    Status On-going    Target Date 11/05/21      PT LONG TERM GOAL #3   Title Pt will decrease 5TSTS by at least 3 seconds in order to demonstrate clinically significant improvement in LE strength.    Baseline 09/20/20: 23.1s with minA+1 for strength and balance (Previous discharge 03/06/20: 16.1s), 12/14: 20 sec, 1/13: 22 sec.   01/02/2021= 16.05 sec with light UE support.  01/21/21 01/21/21 37.90 seconds without UE, with UE support 12.93 seconds: 02/06/2021= with 1 UE 17.93 sec. 02/18/2021= 21.33 sec without UE support. 05/06/2021= 15.3 sec with BUE Support and 21. 0 sec without UE support. 05/13/2021=15.0 sec with BUE support; 09/03/2021= 16 sec without UE support.    Time 12    Period Weeks    Status Achieved      PT LONG TERM GOAL #4   Title Pt will increase his self-selected 10MWT to >0.74 m/s in order to demonstrate improvement in gait speed and improved community ambulation    Baseline 09/20/20: self-selected: 14.4s = 0.69 m/s with rollator (Previous discharge 03/06/20: self-selected: 13.6s = 0.74 m/s), 12/14: 0.75 m/s, 1/13: 0.78 m/s. 01/02/2021= 0.85 m/s using 4WW, 01/16/21 with 4 WW .99 m/s. 02/06/2021= self selected = 0.93 m/s with 7TK    Time 4    Period Weeks    Status Achieved      PT LONG TERM GOAL #5   Title Patient will increase six minute walk test distance to >1000 for progression to community ambulator and improve gait ability    Baseline 01/10/20  620 ft with rollator, 01/21/21  965 ft; 02/06/2021= 910 feet with 4WW.  02/18/2021= 950 feet with 4WW. 05/06/2021= will test next visit. 05/13/2021- Deferred secondary to increased left knee pain. 06/11/21: 737ft with 2IO *knee pain throughout test. 07/11/2021= 900 feet with use of 4WW.  08/13/2021- Deferred testing secondary to patient not feeling well (recovering from recent bout of COVID). Will test next 1-2 sessions. 09/03/2021= 750 feet with use of 4WW (recovering from COVID and strained left hamstring); 10/08/2021=890 feet with use of 4WW- VC not to drag left foot. Patient was distracted in gym stopping and talking a few times limiting his performance- requiring redirection.    Time 12    Period Weeks    Status On-going    Target Date 11/05/21      PT LONG TERM GOAL #6   Title Patient will  demonstrate ability to walk with HHA +1 > 200 ft. without loss of balance on level surfaces to simulate walking his grandaughter down  the aisle for her upcoming wedding in November 2022.    Baseline 05/13/2021- Patient currently ambulating short community distance with use of 4WW but desires to walk with his grandaughter with HHA for her wedding. 06/11/21: attempted 5 steps with HHA x1, deemed unsafe. 07/16/2021- Patient ambulated around 100 feet with HHA (arms interlocked) with PT to simulate walking daughter down aisle.  08/13/2021- Deferred testing secondary to patient not feeling well (recovering from recent bout of COVID). Will test next 1-2 sessions.  10/08/2021=Gait with HHA  on right and use of SPC on left- To simulate walking granddaughter down the aisle- approx 220 feet today total- with CGA and progressing with no report of pain.    Time 12    Period Weeks    Status On-going    Target Date 11/05/21      PT LONG TERM GOAL #7   Title Pt will decrease 5TSTS  to 14 sec or less with BUE support on armrest in order to demonstrate clinically significant improvement in LE strength.    Baseline 05/13/2021=15.0 sec with BUE support; 26 sec without UE support. 06/11/21: 14.61 seconds with BUE support. 07/16/2021= 23 sec without UE support. 08/13/2021= 24. 2 sec with BUE  09/03/2021= 20.97 sec with B UE support - Limited by left hamstring pain. 10/08/2021-19.0 sec with minimal BUE Support  upport.    Time 12    Period Weeks    Status On-going    Target Date 11/05/21                   Plan - 10/29/21 1957     Clinical Impression Statement Treatment limited secondary to patient endorsing low back pain from recent fall last week. He was motivated yet pain limited today. He did report some pain relief with ROM/heat activities. He presents with mm guarding with transfers (supine to sit and sit to stand). Educated in safe bed mobility and pain relieving strategies including use of meds, heat, ice, changing positions and importance of mobility. Instructed if pain worsens to contact MD for further evaluation. Patient and caregiver  verbalized understanding. He will benefit from further skilled PT to improve BLE strength, balance and functional mobility and overall improved quality of life    Personal Factors and Comorbidities Age;Comorbidity 3+    Comorbidities HTN, CVA    Examination-Activity Limitations Bend;Lift;Squat;Locomotion Level;Stairs;Stand    Examination-Participation Restrictions Community Activity;Driving;Laundry;Meal Prep;Shop    Stability/Clinical Decision Making Unstable/Unpredictable    Rehab Potential Fair    PT Frequency 2x / week    PT Duration 12 weeks    PT Treatment/Interventions ADLs/Self Care Home Management;Cryotherapy;Electrical Stimulation;Iontophoresis 4mg /ml Dexamethasone;Moist Heat;Traction;Ultrasound;DME Instruction;Gait training;Stair training;Therapeutic activities;Therapeutic exercise;Functional mobility training;Balance training;Neuromuscular re-education;Patient/family education;Manual techniques;Dry needling;Vestibular;Joint Manipulations;Spinal Manipulations;Aquatic Therapy    PT Next Visit Plan Continue to focus on LE strengthening and balance, dynamic balance tasks    PT Home Exercise Plan no changes    Consulted and Agree with Plan of Care Patient;Family member/caregiver    Family Member Consulted Son             Patient will benefit from skilled therapeutic intervention in order to improve the following deficits and impairments:  Abnormal gait, Decreased balance, Decreased mobility, Difficulty walking, Decreased knowledge of precautions, Decreased safety awareness, Decreased activity tolerance, Decreased strength, Decreased endurance  Visit Diagnosis: Abnormality of gait and mobility  Difficulty  in walking, not elsewhere classified  Muscle weakness (generalized)  Unsteadiness on feet     Problem List Patient Active Problem List   Diagnosis Date Noted   Localized osteoarthritis of knees, bilateral 06/13/2021   Moderate protein-calorie malnutrition ([place]) 03/11/2021    Hemiparesis affecting left side as late effect of cerebrovascular accident (CVA) ([place]) 03/11/2021   Aneurysm, aorta, thoracic 12/21/2020   Calculus of bile duct without cholecystitis and without obstruction    Non-small cell carcinoma of lung, right ([place]) 09/07/2020   Mass of upper lobe of right lung 07/13/2020   Calculus of bile duct with cholecystitis without obstruction    Abnormal findings on imaging of biliary tract    Acute cholecystitis 05/02/2020   Chronic venous insufficiency 12/05/2019   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    CKD (chronic kidney disease), stage II    New onset atrial fibrillation ([place])    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   Goals of care, counseling/discussion    Palliative care by specialist    DNR (do not resuscitate) discussion    History of CVA (cerebrovascular accident) 05/22/2019   Persistent proteinuria 07/30/2018   Mobitz type 2 second degree heart block 07/13/2018   Chronic kidney disease, stage III (moderate) ([place]) 06/15/2018   Leg pain 05/06/2018   Lymphedema 05/06/2018   Coagulopathy ([place]) 07/07/2017   Lumbar spondylosis 03/10/2017   Elevated uric acid in blood 11/20/2016   BPH (benign prostatic hyperplasia) 05/29/2016   Peripheral vascular disease of lower extremity ([place]) 03/03/2016   Gallstone 12/31/2015   Splenic infarct 12/31/2015   Splenic vein thrombosis 11/27/2015   Atherosclerosis of aorta ([place]) 11/22/2015   Carotid artery narrowing 11/22/2015   Diverticulosis of colon 11/22/2015   Decreased creatinine clearance 11/22/2015   Arthritis, degenerative 11/22/2015   Lactose intolerance 11/22/2015   Basal cell carcinoma 11/22/2015   Essential hypertension 06/21/2015   Hyperlipemia 06/21/2015   GERD (gastroesophageal reflux disease) 06/21/2015   Calculus of kidney 11/18/2013    Lewis Moccasin, PT 10/30/2021, 8:38 AM  [place] [address] [place], [place], [address] Phone: [phone]   Fax:  [phone]  Name: ODIE EDMONDS MRN: [id number] Date of Birth: 09-Mar-1926

## 2021-10-31 ENCOUNTER — Other Ambulatory Visit: Payer: Self-pay

## 2021-10-31 ENCOUNTER — Ambulatory Visit: Payer: Medicare Other | Attending: Family Medicine

## 2021-10-31 DIAGNOSIS — R262 Difficulty in walking, not elsewhere classified: Secondary | ICD-10-CM | POA: Insufficient documentation

## 2021-10-31 DIAGNOSIS — M6281 Muscle weakness (generalized): Secondary | ICD-10-CM | POA: Insufficient documentation

## 2021-10-31 DIAGNOSIS — R2681 Unsteadiness on feet: Secondary | ICD-10-CM | POA: Diagnosis present

## 2021-10-31 DIAGNOSIS — R269 Unspecified abnormalities of gait and mobility: Secondary | ICD-10-CM | POA: Diagnosis present

## 2021-10-31 DIAGNOSIS — R278 Other lack of coordination: Secondary | ICD-10-CM | POA: Diagnosis present

## 2021-10-31 NOTE — Therapy (Signed)
[place] [facility] SERVICES [address] [place], [place], [address] Phone: [phone]   Fax:  [phone]  Physical Therapy Treatment  Patient Details  Name: Jose Castro MRN: [id number] Date of Birth: Dec 15, 1925 No data recorded  Encounter Date: 10/31/2021   PT End of Session - 10/31/21 1400     Visit Number 69    Number of Visits 5    Date for PT Re-Evaluation 11/05/21    Authorization Type [employer] Medicare    Authorization Time Period 08/13/2021- 11/05/2021; PN on 10/08/2021    Progress Note Due on Visit 70    PT Start Time 1347    PT Stop Time 1428    PT Time Calculation (min) 41 min    Equipment Utilized During Treatment Gait belt    Activity Tolerance Patient tolerated treatment well;No increased pain    Behavior During Therapy WFL for tasks assessed/performed             Past Medical History:  Diagnosis Date   Allergy    Penicillin   Arthritis    Basal cell carcinoma    BPH (benign prostatic hyperplasia)    Chronic kidney disease    had a kidney stone which per family was a cyst that was removed   Dysrhythmia    GERD (gastroesophageal reflux disease)    History of kidney stones    Hyperlipidemia    Hypertension    Left-sided low back pain with left-sided sciatica    Lung mass    Splenic vein thrombosis    Stroke ([place]) 05/22/2019   Thrombosis 12/2015   mural  area and no notation of heart attack    Past Surgical History:  Procedure Laterality Date   APPENDECTOMY     CATARACT EXTRACTION, BILATERAL     CYSTOSCOPY WITH INSERTION OF UROLIFT     ENDOSCOPIC RETROGRADE CHOLANGIOPANCREATOGRAPHY (ERCP) WITH PROPOFOL N/A 05/11/2020   Procedure: ENDOSCOPIC RETROGRADE CHOLANGIOPANCREATOGRAPHY (ERCP) WITH PROPOFOL;  Surgeon: [name], MD;  Location: [facility] ENDOSCOPY;  Service: Endoscopy;  Laterality: N/A;   ERCP N/A 10/23/2020   Procedure: ENDOSCOPIC RETROGRADE CHOLANGIOPANCREATOGRAPHY (ERCP);  Surgeon: [name], MD;   Location: [facility] ENDOSCOPY;  Service: Endoscopy;  Laterality: N/A;   EYE SURGERY  08/2018   IR EXCHANGE BILIARY DRAIN  05/04/2020   KIDNEY STONE SURGERY     KNEE SURGERY     PACEMAKER INSERTION N/A 07/13/2018   Procedure: INSERTION PACEMAKER-DUEL CHAMBER INITIAL IMPLANT;  Surgeon: Isaias Cowman, MD;  Location: [facility] ORS;  Service: Cardiovascular;  Laterality: N/A;   SPINE SURGERY      There were no vitals filed for this visit.   Subjective Assessment - 10/31/21 1355     Subjective Patient reports feeling a little better today. Caregiver did report that he did have some bruising in the left buttock region. He reports he is getting up better today.    Pertinent History Pt referred for deconditioning and gait instability. He has had a decline since he last finished therapy. Pt had a hospital admission from 05/02/20 to 05/14/20 for acute cholecystitis and is now s/p cholecystostomy drain placement. Drain is still in place and pt is waiting to hear when they plan to remove it. Pt discharged to SNF and then returned home. In addition pt underwent radiation therapy for a RUL lung mass/cancer and had his last radiation treatment in September of 2021. He reports that he has continued to walk at home as his primary exercise. Pt was previously  seen at this clinic for PT/OT due to weakness/imbalance s/p CVA. Prior history from 09/21/19: Pt is a 87 year old male who presents with imbalance and difficulty with gait following a right frontal CVA with left hemiparesis on 05/22/19.  He has completed inpatient and home health PT, and now presents for OP PT.  He was discharged from inpatient rehab at a supervision level of assistance, ambulating with a RW.  His family notes that he is impulsive and will try to ambulate without his RW at home.  Family is currently providing 24/7 supervision.    Currently in Pain? Yes    Pain Score 6     Pain Location Buttocks    Pain Orientation Left;Posterior    Pain Descriptors /  Indicators Aching    Pain Type Chronic pain    Pain Onset In the past 7 days    Pain Frequency Constant             INTERVENTIONS:   Moist heat to left side low back/buttock region while patient performing prolonged hamstring stretch (Left LE propped up on stool) x 5 min  Hamstring L measured at 23 deg pre- stretch and 12 deg post stretch  Standing Posture - retraction 3 sets of 12 reps with GTB - Scap protraction into retraction x 15 reps BUE -Scap clocks- 11 to 5 BUE x 12 reps. Patient with increased difficulty with concept and very limited scapular movement with Left side.  -BUE shoulder flex using PVC with emphasis on looking ahead for posture- x 12 reps.  -standing Horizontal ABD with PVC pipe x 10 reps BUE  Standing LE strengthening -Calf raises x 15 reps BLE using 4WW for support- No report of back pain. -Stand hip abd x 15 reps BLE using 4WW for support- No c/o LBP  Education provided throughout session via VC/TC and demonstration to facilitate movement at target joints and correct muscle activation for all testing and exercises performed.   Patient arrived with improved overall back pain versus last session yet still sore. He responded well to heat while performing LE stretching/exercises. He was able to respond well to all instruction and cues with standing therex for improved postural and LE strength. He responded well with no report of any increased pain today. He was able to stand well today with min VC's for technique.He will benefit from further skilled PT to improve BLE strength, balance and functional mobility and overall improved quality of life                       PT Education - 10/31/21 1358     Education Details Exercise technique    Person(s) Educated Patient    Methods Explanation    Comprehension Verbalized understanding;Returned demonstration;Verbal cues required;Need further instruction              PT Short Term Goals -  01/21/21 1438       PT SHORT TERM GOAL #1   Title Pt will be independent with HEP in order to improve strength and balance in order to decrease fall risk and improve function at home.    Baseline 01/02/2021- Patient verbalize understanding of walking and seated exercises for home program. 01/21/21 patient has been more active and understands HEP.    Time 6    Period Weeks    Status Achieved    Target Date 01/10/21               PT Long Term  Goals - 10/09/21 0948       PT LONG TERM GOAL #1   Title Pt will improve BERG by at least 3 points in order to demonstrate clinically significant improvement in balance.    Baseline 09/20/20: 32/56 (Previous discharge: 03/06/20: 46/56), 12/14: 32/56, 1/13: 31/56, 01/16/21 35/56, 02/06/2021=36/56. 02/18/2021= 37/56.03/20/2021- Deferred secondary to left knee aching- will attempt next visit. 05/06/2021= 39/56; 05/13/2021= 36/56 *patient having more left knee pain. 07/16/2021= 40/56    Time 12    Period Weeks    Status Achieved      PT LONG TERM GOAL #2   Title Pt will decrease TUG to below 14 seconds/decrease in order to demonstrate decreased fall risk.    Baseline 09/20/20: 19.2s, 12/14: 17.48, 1/13: 16.65 sec. 01/02/2021=15.75 sec using 4WW, 01/21/21  14.37 seconds. 02/06/2021= 14.92 sec using 4WW. 02/18/2021= 14.5 sec using 4WW. 03/20/2021- Deferred secondary to left knee aching- will attempt next visit. 05/06/2021= 16.32 sec avg using 4WW. 05/13/2021= 16.5 sec with 4WW *increased left knee pain. 06/11/21: 20.11 seconds (increased time to turn) using 4WW;  08/13/2021- Deferred testing secondary to patient not feeling well (recovering from recent bout of COVID). Will test next 1-2 sessions. 09/03/2021= 16.83 sec using 4WW    Time 12    Period Weeks    Status On-going    Target Date 11/05/21      PT LONG TERM GOAL #3   Title Pt will decrease 5TSTS by at least 3 seconds in order to demonstrate clinically significant improvement in LE strength.    Baseline 09/20/20:  23.1s with minA+1 for strength and balance (Previous discharge 03/06/20: 16.1s), 12/14: 20 sec, 1/13: 22 sec.   01/02/2021= 16.05 sec with light UE support. 01/21/21 01/21/21 37.90 seconds without UE, with UE support 12.93 seconds: 02/06/2021= with 1 UE 17.93 sec. 02/18/2021= 21.33 sec without UE support. 05/06/2021= 15.3 sec with BUE Support and 21. 0 sec without UE support. 05/13/2021=15.0 sec with BUE support; 09/03/2021= 16 sec without UE support.    Time 12    Period Weeks    Status Achieved      PT LONG TERM GOAL #4   Title Pt will increase his self-selected 10MWT to >0.74 m/s in order to demonstrate improvement in gait speed and improved community ambulation    Baseline 09/20/20: self-selected: 14.4s = 0.69 m/s with rollator (Previous discharge 03/06/20: self-selected: 13.6s = 0.74 m/s), 12/14: 0.75 m/s, 1/13: 0.78 m/s. 01/02/2021= 0.85 m/s using 4WW, 01/16/21 with 4 WW .99 m/s. 02/06/2021= self selected = 0.93 m/s with 3YB    Time 4    Period Weeks    Status Achieved      PT LONG TERM GOAL #5   Title Patient will increase six minute walk test distance to >1000 for progression to community ambulator and improve gait ability    Baseline 01/10/20  620 ft with rollator, 01/21/21  965 ft; 02/06/2021= 910 feet with 4WW.  02/18/2021= 950 feet with 4WW. 05/06/2021= will test next visit. 05/13/2021- Deferred secondary to increased left knee pain. 06/11/21: 789ft with 0FB *knee pain throughout test. 07/11/2021= 900 feet with use of 4WW.  08/13/2021- Deferred testing secondary to patient not feeling well (recovering from recent bout of COVID). Will test next 1-2 sessions. 09/03/2021= 750 feet with use of 4WW (recovering from COVID and strained left hamstring); 10/08/2021=890 feet with use of 4WW- VC not to drag left foot. Patient was distracted in gym stopping and talking a few times limiting his performance- requiring redirection.  Time 12    Period Weeks    Status On-going    Target Date 11/05/21      PT LONG TERM GOAL #6    Title Patient will demonstrate ability to walk with HHA +1 > 200 ft. without loss of balance on level surfaces to simulate walking his grandaughter down the aisle for her upcoming wedding in November 2022.    Baseline 05/13/2021- Patient currently ambulating short community distance with use of 4WW but desires to walk with his grandaughter with HHA for her wedding. 06/11/21: attempted 5 steps with HHA x1, deemed unsafe. 07/16/2021- Patient ambulated around 100 feet with HHA (arms interlocked) with PT to simulate walking daughter down aisle.  08/13/2021- Deferred testing secondary to patient not feeling well (recovering from recent bout of COVID). Will test next 1-2 sessions.  10/08/2021=Gait with HHA  on right and use of SPC on left- To simulate walking granddaughter down the aisle- approx 220 feet today total- with CGA and progressing with no report of pain.    Time 12    Period Weeks    Status On-going    Target Date 11/05/21      PT LONG TERM GOAL #7   Title Pt will decrease 5TSTS  to 14 sec or less with BUE support on armrest in order to demonstrate clinically significant improvement in LE strength.    Baseline 05/13/2021=15.0 sec with BUE support; 26 sec without UE support. 06/11/21: 14.61 seconds with BUE support. 07/16/2021= 23 sec without UE support. 08/13/2021= 24. 2 sec with BUE  09/03/2021= 20.97 sec with B UE support - Limited by left hamstring pain. 10/08/2021-19.0 sec with minimal BUE Support  upport.    Time 12    Period Weeks    Status On-going    Target Date 11/05/21                   Plan - 10/31/21 1402     Clinical Impression Statement Patient arrived with improved overall back pain versus last session yet still sore. He responded well to heat while performing LE stretching/exercises. He was able to respond well to all instruction and cues with standing therex for improved postural and LE strength. He responded well with no report of any increased pain today. He was able to stand  well today with min VC's for technique.He will benefit from further skilled PT to improve BLE strength, balance and functional mobility and overall improved quality of life    Personal Factors and Comorbidities Age;Comorbidity 3+    Comorbidities HTN, CVA    Examination-Activity Limitations Bend;Lift;Squat;Locomotion Level;Stairs;Stand    Examination-Participation Restrictions Community Activity;Driving;Laundry;Meal Prep;Shop    Stability/Clinical Decision Making Unstable/Unpredictable    Rehab Potential Fair    PT Frequency 2x / week    PT Duration 12 weeks    PT Treatment/Interventions ADLs/Self Care Home Management;Cryotherapy;Electrical Stimulation;Iontophoresis 4mg /ml Dexamethasone;Moist Heat;Traction;Ultrasound;DME Instruction;Gait training;Stair training;Therapeutic activities;Therapeutic exercise;Functional mobility training;Balance training;Neuromuscular re-education;Patient/family education;Manual techniques;Dry needling;Vestibular;Joint Manipulations;Spinal Manipulations;Aquatic Therapy    PT Next Visit Plan Continue to focus on LE strengthening and balance, dynamic balance tasks    PT Home Exercise Plan no changes    Consulted and Agree with Plan of Care Patient;Family member/caregiver    Family Member Consulted Son             Patient will benefit from skilled therapeutic intervention in order to improve the following deficits and impairments:  Abnormal gait, Decreased balance, Decreased mobility, Difficulty walking, Decreased knowledge of precautions, Decreased safety awareness,  Decreased activity tolerance, Decreased strength, Decreased endurance  Visit Diagnosis: Abnormality of gait and mobility  Difficulty in walking, not elsewhere classified  Muscle weakness (generalized)  Unsteadiness on feet     Problem List Patient Active Problem List   Diagnosis Date Noted   Localized osteoarthritis of knees, bilateral 06/13/2021   Moderate protein-calorie malnutrition  ([place]) 03/11/2021   Hemiparesis affecting left side as late effect of cerebrovascular accident (CVA) ([place]) 03/11/2021   Aneurysm, aorta, thoracic 12/21/2020   Calculus of bile duct without cholecystitis and without obstruction    Non-small cell carcinoma of lung, right (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   Goals of care, counseling/discussion    Palliative care by specialist    DNR (do not resuscitate) discussion    History of CVA (cerebrovascular accident) 05/22/2019   Persistent proteinuria 07/30/2018   M[place]itz type 2 second degree heart block 07/13/2018   Chronic kidney disease, stage III (moderate) ([place]) 06/15/2018   Leg pain 05/06/2018   Lymphedema 05/06/2018   Coagulopathy ([place]) 07/07/2017   Lumbar spondylosis 03/10/2017   Elevated uric acid in blood 11/20/2016   BPH (benign prostatic hyperplasia) 05/29/2016   Peripheral vascular disease of lower extremity ([place]) 03/03/2016   Gallstone 12/31/2015   Splenic infarct 12/31/2015   Splenic vein thrombosis 11/27/2015   Atherosclerosis of aorta ([place]) 11/22/2015   Carotid artery narrowing 11/22/2015   Diverticulosis of colon 11/22/2015   Decreased creatinine clearance 11/22/2015   Arthritis, degenerative 11/22/2015   Lactose intolerance 11/22/2015   Basal cell carcinoma 11/22/2015   Essential hypertension 06/21/2015   Hyperlipemia 06/21/2015   GERD (gastroesophageal reflux disease) 06/21/2015   Calculus of kidney 11/18/2013    Lewis Moccasin, PT 11/01/2021, 8:39 AM  [facility] [address] [place], [place], [address] Phone: [phone]   Fax:  [phone]  Name: RUSTYN CONERY MRN: [id number] Date of Birth: [birth year]

## 2021-11-05 ENCOUNTER — Ambulatory Visit: Payer: Medicare Other

## 2021-11-05 ENCOUNTER — Other Ambulatory Visit: Payer: Self-pay

## 2021-11-05 DIAGNOSIS — R269 Unspecified abnormalities of gait and mobility: Secondary | ICD-10-CM

## 2021-11-05 DIAGNOSIS — R2681 Unsteadiness on feet: Secondary | ICD-10-CM

## 2021-11-05 DIAGNOSIS — R278 Other lack of coordination: Secondary | ICD-10-CM

## 2021-11-05 DIAGNOSIS — M6281 Muscle weakness (generalized): Secondary | ICD-10-CM

## 2021-11-05 DIAGNOSIS — R262 Difficulty in walking, not elsewhere classified: Secondary | ICD-10-CM

## 2021-11-05 NOTE — Therapy (Signed)
[place] [facility] SERVICES [address] [place], [place], [address] Phone: [phone]   Fax:  [phone]  Physical Therapy Treatment/Recertification for dates 11/05/2021- 01/28/2021  Patient Details  Name: Jose Castro MRN: [id number] Date of Birth: 11-12-1926 No data recorded  Encounter Date: 11/05/2021   PT End of Session - 11/05/21 1724     Visit Number 63    Number of Visits 65    Date for PT Re-Evaluation 11/05/21    Authorization Type [employer] Medicare    Authorization Time Period 08/13/2021- 11/05/2021; PN on 10/08/2021    Progress Note Due on Visit 70    PT Start Time 1346    PT Stop Time 1428    PT Time Calculation (min) 42 min    Equipment Utilized During Treatment Gait belt    Activity Tolerance Patient tolerated treatment well;No increased pain    Behavior During Therapy WFL for tasks assessed/performed             Past Medical History:  Diagnosis Date   Allergy    Penicillin   Arthritis    Basal cell carcinoma    BPH (benign prostatic hyperplasia)    Chronic kidney disease    had a kidney stone which per family was a cyst that was removed   Dysrhythmia    GERD (gastroesophageal reflux disease)    History of kidney stones    Hyperlipidemia    Hypertension    Left-sided low back pain with left-sided sciatica    Lung mass    Splenic vein thrombosis    Stroke ([place]) 05/22/2019   Thrombosis 12/2015   mural  area and no notation of heart attack    Past Surgical History:  Procedure Laterality Date   APPENDECTOMY     CATARACT EXTRACTION, BILATERAL     CYSTOSCOPY WITH INSERTION OF UROLIFT     ENDOSCOPIC RETROGRADE CHOLANGIOPANCREATOGRAPHY (ERCP) WITH PROPOFOL N/A 05/11/2020   Procedure: ENDOSCOPIC RETROGRADE CHOLANGIOPANCREATOGRAPHY (ERCP) WITH PROPOFOL;  Surgeon: [name], MD;  Location: [facility] ENDOSCOPY;  Service: Endoscopy;  Laterality: N/A;   ERCP N/A 10/23/2020   Procedure: ENDOSCOPIC RETROGRADE  CHOLANGIOPANCREATOGRAPHY (ERCP);  Surgeon: [name], MD;  Location: [facility] ENDOSCOPY;  Service: Endoscopy;  Laterality: N/A;   EYE SURGERY  08/2018   IR EXCHANGE BILIARY DRAIN  05/04/2020   KIDNEY STONE SURGERY     KNEE SURGERY     PACEMAKER INSERTION N/A 07/13/2018   Procedure: INSERTION PACEMAKER-DUEL CHAMBER INITIAL IMPLANT;  Surgeon: Isaias Cowman, MD;  Location: [facility] ORS;  Service: Cardiovascular;  Laterality: N/A;   SPINE SURGERY      There were no vitals filed for this visit.   Subjective Assessment - 11/05/21 1355     Subjective Kids goals- Would like to continue with Strength gains and maybe walk with cane or upright walker.    Pertinent History Pt referred for deconditioning and gait instability. He has had a decline since he last finished therapy. Pt had a hospital admission from 05/02/20 to 05/14/20 for acute cholecystitis and is now s/p cholecystostomy drain placement. Drain is still in place and pt is waiting to hear when they plan to remove it. Pt discharged to SNF and then returned home. In addition pt underwent radiation therapy for a RUL lung mass/cancer and had his last radiation treatment in September of 2021. He reports that he has continued to walk at home as his primary exercise. Pt was previously seen at this clinic for PT/OT due to  weakness/imbalance s/p CVA. Prior history from 09/21/19: Pt is a 87 year old male who presents with imbalance and difficulty with gait following a right frontal CVA with left hemiparesis on 05/22/19.  He has completed inpatient and home health PT, and now presents for OP PT.  He was discharged from inpatient rehab at a supervision level of assistance, ambulating with a RW.  His family notes that he is impulsive and will try to ambulate without his RW at home.  Family is currently providing 24/7 supervision.    Pain Onset In the past 7 days             INTERVENTIONS:   Reassessed LTGs for Planned recert visit.   TUG= 19.22 avg for 2  trials using 4WW (Increased VC for turning) Patient presented with mild difficulty with slower turning today than in previous visits.   10 Meter walk test=0.88 m/s using 4WW and 0.37 m/s with single point cane   6 Min walk test= 750 feet with 4WW- Increased dragging of left foot after 4 min mark- no complaint of pain only fatigue.   5 x STS= 16.31 sec with BUE support.    Added new LTG for ambulation with SPC- See goal section for specifics.    Patient presents with some improvement overall and some decline during this latest certification. Sessions were focused on building up enough LE strength, coordination, and balance to walk his granddaughter down the aisle and he did achieve this goal as well as demo improved LE strength with 5 time sit to stand test. He presented with slight decline in overall TUG, 10 meter walk test, and 6 min walk test most likely due to recovering from recent fall. He has new goal of trying to walk with cane in home some if safe and his condition has the potential to improve in response to therapy. Maximum improvement is yet to be obtained. The anticipated improvement is attainable and reasonable in a generally predictable time.  He will benefit from further skilled PT to improve BLE strength, balance and functional mobility and overall improved quality of life. Will plan to seek recertification at 1x/week for 12 weeks beginning after 11/05/2021.                        PT Education - 11/05/21 1725     Education Details Plan of care- purspose of functional outcomes    Person(s) Educated Patient;Child(ren)    Methods Explanation;Demonstration;Tactile cues;Verbal cues    Comprehension Verbalized understanding;Returned demonstration;Verbal cues required;Tactile cues required;Need further instruction              PT Short Term Goals - 01/21/21 1438       PT SHORT TERM GOAL #1   Title Pt will be independent with HEP in order to improve strength  and balance in order to decrease fall risk and improve function at home.    Baseline 01/02/2021- Patient verbalize understanding of walking and seated exercises for home program. 01/21/21 patient has been more active and understands HEP.    Time 6    Period Weeks    Status Achieved    Target Date 01/10/21               PT Long Term Goals - 11/05/21 1414       PT LONG TERM GOAL #1   Title Pt will improve BERG by at least 3 points in order to demonstrate clinically significant improvement in balance.  Baseline 09/20/20: 32/56 (Previous discharge: 03/06/20: 46/56), 12/14: 32/56, 1/13: 31/56, 01/16/21 35/56, 02/06/2021=36/56. 02/18/2021= 37/56.03/20/2021- Deferred secondary to left knee aching- will attempt next visit. 05/06/2021= 39/56; 05/13/2021= 36/56 *patient having more left knee pain. 07/16/2021= 40/56    Time 12    Period Weeks    Status Achieved      PT LONG TERM GOAL #2   Title Pt will decrease TUG to below 14 seconds/decrease in order to demonstrate decreased fall risk.    Baseline 09/20/20: 19.2s, 12/14: 17.48, 1/13: 16.65 sec. 01/02/2021=15.75 sec using 4WW, 01/21/21  14.37 seconds. 02/06/2021= 14.92 sec using 4WW. 02/18/2021= 14.5 sec using 4WW. 03/20/2021- Deferred secondary to left knee aching- will attempt next visit. 05/06/2021= 16.32 sec avg using 4WW. 05/13/2021= 16.5 sec with 4WW *increased left knee pain. 06/11/21: 20.11 seconds (increased time to turn) using 4WW;  08/13/2021- Deferred testing secondary to patient not feeling well (recovering from recent bout of COVID). Will test next 1-2 sessions. 09/03/2021= 16.83 sec using 4WW 11/04/2021= 19.22 sec    Time 12    Period Weeks    Status On-going    Target Date 01/28/22      PT LONG TERM GOAL #3   Title Pt will decrease 5TSTS by at least 3 seconds in order to demonstrate clinically significant improvement in LE strength.    Baseline 09/20/20: 23.1s with minA+1 for strength and balance (Previous discharge 03/06/20: 16.1s), 12/14: 20 sec,  1/13: 22 sec.   01/02/2021= 16.05 sec with light UE support. 01/21/21 01/21/21 37.90 seconds without UE, with UE support 12.93 seconds: 02/06/2021= with 1 UE 17.93 sec. 02/18/2021= 21.33 sec without UE support. 05/06/2021= 15.3 sec with BUE Support and 21. 0 sec without UE support. 05/13/2021=15.0 sec with BUE support; 09/03/2021= 16 sec without UE support.    Time 12    Period Weeks    Status Achieved      PT LONG TERM GOAL #4   Title Pt will increase his self-selected 10MWT to >0.74 m/s in order to demonstrate improvement in gait speed and improved community ambulation    Baseline 09/20/20: self-selected: 14.4s = 0.69 m/s with rollator (Previous discharge 03/06/20: self-selected: 13.6s = 0.74 m/s), 12/14: 0.75 m/s, 1/13: 0.78 m/s. 01/02/2021= 0.85 m/s using 4WW, 01/16/21 with 4 WW .99 m/s. 02/06/2021= self selected = 0.93 m/s with 8EU    Time 4    Period Weeks    Status Achieved      PT LONG TERM GOAL #5   Title Patient will increase six minute walk test distance to >1000 for progression to community ambulator and improve gait ability    Baseline 01/10/20  620 ft with rollator, 01/21/21  965 ft; 02/06/2021= 910 feet with 4WW.  02/18/2021= 950 feet with 4WW. 05/06/2021= will test next visit. 05/13/2021- Deferred secondary to increased left knee pain. 06/11/21: 780ft with 2PN *knee pain throughout test. 07/11/2021= 900 feet with use of 4WW.  08/13/2021- Deferred testing secondary to patient not feeling well (recovering from recent bout of COVID). Will test next 1-2 sessions. 09/03/2021= 750 feet with use of 4WW (recovering from COVID and strained left hamstring); 10/08/2021=890 feet with use of 4WW- VC not to drag left foot. Patient was distracted in gym stopping and talking a few times limiting his performance- requiring redirection. 11/05/2021-750 feet with 3IR- Increased dragging of left foot after 4 min mark- no complaint of pain only fatigue.    Time 12    Period Weeks    Status On-going  Target Date 01/28/22       Additional Long Term Goals   Additional Long Term Goals Yes      PT LONG TERM GOAL #6   Title Patient will demonstrate ability to walk with HHA +1 > 200 ft. without loss of balance on level surfaces to simulate walking his grandaughter down the aisle for her upcoming wedding in November 2022.    Baseline 05/13/2021- Patient currently ambulating short community distance with use of 4WW but desires to walk with his grandaughter with HHA for her wedding. 06/11/21: attempted 5 steps with HHA x1, deemed unsafe. 07/16/2021- Patient ambulated around 100 feet with HHA (arms interlocked) with PT to simulate walking daughter down aisle.  08/13/2021- Deferred testing secondary to patient not feeling well (recovering from recent bout of COVID). Will test next 1-2 sessions.  10/08/2021=Gait with HHA  on right and use of SPC on left- To simulate walking granddaughter down the aisle- approx 220 feet today total- with CGA and progressing with no report of pain. 11/04/2021- Patient was able to successfully walk his grandaughter down the aisle a couple of weeks ago.    Time 12    Period Weeks    Status Achieved    Target Date 11/05/21      PT LONG TERM GOAL #7   Title Pt will decrease 5TSTS  to 14 sec or less with BUE support on armrest in order to demonstrate clinically significant improvement in LE strength.    Baseline 05/13/2021=15.0 sec with BUE support; 26 sec without UE support. 06/11/21: 14.61 seconds with BUE support. 07/16/2021= 23 sec without UE support. 08/13/2021= 24. 2 sec with BUE  09/03/2021= 20.97 sec with B UE support - Limited by left hamstring pain. 10/08/2021-19.0 sec with minimal BUE Support  upport. 11/05/2021= 16.31 sec with BUE support.    Time 12    Period Weeks    Status On-going    Target Date 01/28/22      PT LONG TERM GOAL #8   Title Patient will demonstrate modified independence with ambulation using SPC on level surfaces at > 300 feet for improved household and short community distances without  loss of balance or excessive fatigue.    Baseline 11/05/2021= Patient able to ambulate approx 100 feet with SPC, close CGA- difficulty with gait sequencing and mild limp yet no report of increased left knee pain.                   Plan - 11/05/21 1722     Clinical Impression Statement Patient presents with some improvement overall and some decline during this latest certification. Sessions were focused on building up enough LE strength, coordination, and balance to walk his granddaughter down the aisle and he did achieve this goal as well as demo improved LE strength with 5 time sit to stand test. He presented with slight decline in overall TUG, 10 meter walk test, and 6 min walk test most likely due to recovering from recent fall. He has new goal of trying to walk with cane in home some if safe and his condition has the potential to improve in response to therapy. Maximum improvement is yet to be obtained. The anticipated improvement is attainable and reasonable in a generally predictable time.  He will benefit from further skilled PT to improve BLE strength, balance and functional mobility and overall improved quality of life.Will plan to seek recertification at 1x/week for 12 weeks beginning after 11/05/2021.    Personal Factors and  Comorbidities Age;Comorbidity 3+    Comorbidities HTN, CVA    Examination-Activity Limitations Bend;Lift;Squat;Locomotion Level;Stairs;Stand    Examination-Participation Restrictions Community Activity;Driving;Laundry;Meal Prep;Shop    Stability/Clinical Decision Making Unstable/Unpredictable    Rehab Potential Fair    PT Frequency 2x / week    PT Duration 12 weeks    PT Treatment/Interventions ADLs/Self Care Home Management;Cryotherapy;Electrical Stimulation;Iontophoresis 4mg /ml Dexamethasone;Moist Heat;Traction;Ultrasound;DME Instruction;Gait training;Stair training;Therapeutic activities;Therapeutic exercise;Functional mobility training;Balance  training;Neuromuscular re-education;Patient/family education;Manual techniques;Dry needling;Vestibular;Joint Manipulations;Spinal Manipulations;Aquatic Therapy    PT Next Visit Plan Continue to focus on LE strengthening and balance, dynamic balance tasks- Add strengthening exercises to HEP next visit.    PT Home Exercise Plan no changes    Consulted and Agree with Plan of Care Patient;Family member/caregiver    Family Member Consulted Son             Patient will benefit from skilled therapeutic intervention in order to improve the following deficits and impairments:  Abnormal gait, Decreased balance, Decreased mobility, Difficulty walking, Decreased knowledge of precautions, Decreased safety awareness, Decreased activity tolerance, Decreased strength, Decreased endurance  Visit Diagnosis: Abnormality of gait and mobility  Difficulty in walking, not elsewhere classified  Muscle weakness (generalized)  Other lack of coordination  Unsteadiness on feet     Problem List Patient Active Problem List   Diagnosis Date Noted   Localized osteoarthritis of knees, bilateral 06/13/2021   Moderate protein-calorie malnutrition ([place]) 03/11/2021   Hemiparesis affecting left side as late effect of cerebrovascular accident (CVA) ([place]) 03/11/2021   Aneurysm, aorta, thoracic 12/21/2020   Calculus of bile duct without cholecystitis and without obstruction    Non-small cell carcinoma of lung, right ([place]) 09/07/2020   Mass of upper lobe of right lung 07/13/2020   Calculus of bile duct with cholecystitis without obstruction    Abnormal findings on imaging of biliary tract    Acute cholecystitis 05/02/2020   Chronic venous insufficiency 12/05/2019   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    CKD (chronic kidney disease), stage II    New onset atrial fibrillation ([place])    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   Goals of care, counseling/discussion    Palliative care by  specialist    DNR (do not resuscitate) discussion    History of CVA (cerebrovascular accident) 05/22/2019   Persistent proteinuria 07/30/2018   Mobitz type 2 second degree heart block 07/13/2018   Chronic kidney disease, stage III (moderate) ([place]) 06/15/2018   Leg pain 05/06/2018   Lymphedema 05/06/2018   Coagulopathy ([place]) 07/07/2017   Lumbar spondylosis 03/10/2017   Elevated uric acid in blood 11/20/2016   BPH (benign prostatic hyperplasia) 05/29/2016   Peripheral vascular disease of lower extremity ([place]) 03/03/2016   Gallstone 12/31/2015   Splenic infarct 12/31/2015   Splenic vein thrombosis 11/27/2015   Atherosclerosis of aorta ([place]) 11/22/2015   Carotid artery narrowing 11/22/2015   Diverticulosis of colon 11/22/2015   Decreased creatinine clearance 11/22/2015   Arthritis, degenerative 11/22/2015   Lactose intolerance 11/22/2015   Basal cell carcinoma 11/22/2015   Essential hypertension 06/21/2015   Hyperlipemia 06/21/2015   GERD (gastroesophageal reflux disease) 06/21/2015   Calculus of kidney 11/18/2013    Lewis Moccasin, PT 11/05/2021, 5:27 PM  [facility] [address] [place], [place], [address] Phone: [phone]   Fax:  [phone]  Name: Jose Castro MRN: [id number] Date of Birth: 01-Jun-1926

## 2021-11-07 ENCOUNTER — Ambulatory Visit: Payer: Medicare Other

## 2021-11-07 ENCOUNTER — Other Ambulatory Visit: Payer: Self-pay

## 2021-11-07 DIAGNOSIS — R269 Unspecified abnormalities of gait and mobility: Secondary | ICD-10-CM | POA: Diagnosis not present

## 2021-11-07 DIAGNOSIS — M6281 Muscle weakness (generalized): Secondary | ICD-10-CM

## 2021-11-07 DIAGNOSIS — R2681 Unsteadiness on feet: Secondary | ICD-10-CM

## 2021-11-07 DIAGNOSIS — R262 Difficulty in walking, not elsewhere classified: Secondary | ICD-10-CM

## 2021-11-07 DIAGNOSIS — R278 Other lack of coordination: Secondary | ICD-10-CM

## 2021-11-07 NOTE — Therapy (Signed)
[facility] SERVICES [address] [place], [place], [address] Phone: [phone]   Fax:  [phone]  Physical Therapy Treatment  Patient Details  Name: Jose Castro MRN: [id number] Date of Birth: Mar 18, 1926 No data recorded  Encounter Date: 11/07/2021   PT End of Session - 11/07/21 1506     Visit Number 63    Number of Visits 26    Date for PT Re-Evaluation 11/05/21    Authorization Type [employer] Medicare    Authorization Time Period 08/13/2021- 11/05/2021; PN on 10/08/2021    Progress Note Due on Visit 70    PT Start Time 1346    PT Stop Time 1425    PT Time Calculation (min) 39 min    Equipment Utilized During Treatment Gait belt    Activity Tolerance Patient tolerated treatment well;No increased pain    Behavior During Therapy WFL for tasks assessed/performed             Past Medical History:  Diagnosis Date   Allergy    Penicillin   Arthritis    Basal cell carcinoma    BPH (benign prostatic hyperplasia)    Chronic kidney disease    had a kidney stone which per family was a cyst that was removed   Dysrhythmia    GERD (gastroesophageal reflux disease)    History of kidney stones    Hyperlipidemia    Hypertension    Left-sided low back pain with left-sided sciatica    Lung mass    Splenic vein thrombosis    Stroke ([place]) 05/22/2019   Thrombosis 12/2015   mural  area and no notation of heart attack    Past Surgical History:  Procedure Laterality Date   APPENDECTOMY     CATARACT EXTRACTION, BILATERAL     CYSTOSCOPY WITH INSERTION OF UROLIFT     ENDOSCOPIC RETROGRADE CHOLANGIOPANCREATOGRAPHY (ERCP) WITH PROPOFOL N/A 05/11/2020   Procedure: ENDOSCOPIC RETROGRADE CHOLANGIOPANCREATOGRAPHY (ERCP) WITH PROPOFOL;  Surgeon: [name], MD;  Location: [facility] ENDOSCOPY;  Service: Endoscopy;  Laterality: N/A;   ERCP N/A 10/23/2020   Procedure: ENDOSCOPIC RETROGRADE CHOLANGIOPANCREATOGRAPHY (ERCP);  Surgeon: [name], MD;   Location: [facility] ENDOSCOPY;  Service: Endoscopy;  Laterality: N/A;   EYE SURGERY  08/2018   IR EXCHANGE BILIARY DRAIN  05/04/2020   KIDNEY STONE SURGERY     KNEE SURGERY     PACEMAKER INSERTION N/A 07/13/2018   Procedure: INSERTION PACEMAKER-DUEL CHAMBER INITIAL IMPLANT;  Surgeon: Isaias Cowman, MD;  Location: [facility] ORS;  Service: Cardiovascular;  Laterality: N/A;   SPINE SURGERY      There were no vitals filed for this visit.   Subjective Assessment - 11/07/21 1357     Subjective Patient Reports moving slow today but no new issues.    Pertinent History Pt referred for deconditioning and gait instability. He has had a decline since he last finished therapy. Pt had a hospital admission from 05/02/20 to 05/14/20 for acute cholecystitis and is now s/p cholecystostomy drain placement. Drain is still in place and pt is waiting to hear when they plan to remove it. Pt discharged to SNF and then returned home. In addition pt underwent radiation therapy for a RUL lung mass/cancer and had his last radiation treatment in September of 2021. He reports that he has continued to walk at home as his primary exercise. Pt was previously seen at this clinic for PT/OT due to weakness/imbalance s/p CVA. Prior history from 09/21/19: Pt is a 87 year old male  who presents with imbalance and difficulty with gait following a right frontal CVA with left hemiparesis on 05/22/19.  He has completed inpatient and home health PT, and now presents for OP PT.  He was discharged from inpatient rehab at a supervision level of assistance, ambulating with a RW.  His family notes that he is impulsive and will try to ambulate without his RW at home.  Family is currently providing 24/7 supervision.    Currently in Pain? Yes    Pain Score 4     Pain Location Knee    Pain Orientation Left;Anterior    Pain Descriptors / Indicators Aching    Pain Type Acute pain    Pain Onset In the past 7 days            INTERVENTIONS:   As patient  will likely transition to home program with now weekly visit- treatment today focused on seated LE strengthening program with primary caregiver present to discuss the following exercises to be added to HEP.   Therapeutic Exercises:   -Resistive seated hip march- 2 x 12 reps each using GTB (resistance band held by PT)- VC to increase height of knee raise for full ROM and improved potential for strengthening. Patient exhibited some difficulty yet able to progress with cueing.   -Resistive hip abd- GTB 2 x 12 reps- Instructed to perform 1 LE at a time and lift foot off floor and move laterally in line with her knee.   -Resistive ham curl with GTB x 12 reps. VC for full ROM. Patient exhibited increased weakness overall in left LE.  - Active knee ext- Assist to achieve as full of ROM with Left LE as possible. Patient achieved improved ROM with VC/TC today.   -Resistive Plantarflexion- Patient Performed on his own holding onto band 2 sets of 12 reps each leg- Excessive VC for technique.   Education provided throughout session via VC/TC and demonstration to facilitate movement at target joints and correct muscle activation for all testing and exercises performed.   All seated exercises were added to home program:  Access Code: [email or web address] URL: [email or web address] Date: 11/07/2021 Prepared by: Sande Brothers  Exercises Seated Hip Flexion March with Ankle Weights - 3 x weekly - 3 sets - 10 reps - 2 hold Seated Hip Abduction with Resistance - 3 x weekly - 3 sets - 10 reps Seated Hamstring Curl with Anchored Resistance - 3 x weekly - 3 sets - 10 reps - 2 hold Seated Calf Stretch with Strap - 3 x weekly - 3 sets - 10 reps Seated Long Arc Quad - 7 x weekly - 3 sets - 10 reps - 2 hold                             PT Education - 11/07/21 1505     Education Details Seated exercise technique with use of theraband for HEP    Person(s) Educated  Patient;Caregiver(s)   [name]   Methods Explanation;Demonstration;Tactile cues;Verbal cues    Comprehension Verbalized understanding;Returned demonstration;Verbal cues required;Tactile cues required;Need further instruction              PT Short Term Goals - 01/21/21 1438       PT SHORT TERM GOAL #1   Title Pt will be independent with HEP in order to improve strength and balance in order to decrease fall risk and improve function at home.    Baseline 01/02/2021- Patient  verbalize understanding of walking and seated exercises for home program. 01/21/21 patient has been more active and understands HEP.    Time 6    Period Weeks    Status Achieved    Target Date 01/10/21               PT Long Term Goals - 11/05/21 1414       PT LONG TERM GOAL #1   Title Pt will improve BERG by at least 3 points in order to demonstrate clinically significant improvement in balance.    Baseline 09/20/20: 32/56 (Previous discharge: 03/06/20: 46/56), 12/14: 32/56, 1/13: 31/56, 01/16/21 35/56, 02/06/2021=36/56. 02/18/2021= 37/56.03/20/2021- Deferred secondary to left knee aching- will attempt next visit. 05/06/2021= 39/56; 05/13/2021= 36/56 *patient having more left knee pain. 07/16/2021= 40/56    Time 12    Period Weeks    Status Achieved      PT LONG TERM GOAL #2   Title Pt will decrease TUG to below 14 seconds/decrease in order to demonstrate decreased fall risk.    Baseline 09/20/20: 19.2s, 12/14: 17.48, 1/13: 16.65 sec. 01/02/2021=15.75 sec using 4WW, 01/21/21  14.37 seconds. 02/06/2021= 14.92 sec using 4WW. 02/18/2021= 14.5 sec using 4WW. 03/20/2021- Deferred secondary to left knee aching- will attempt next visit. 05/06/2021= 16.32 sec avg using 4WW. 05/13/2021= 16.5 sec with 4WW *increased left knee pain. 06/11/21: 20.11 seconds (increased time to turn) using 4WW;  08/13/2021- Deferred testing secondary to patient not feeling well (recovering from recent bout of COVID). Will test next 1-2 sessions.  09/03/2021= 16.83 sec using 4WW 11/04/2021= 19.22 sec    Time 12    Period Weeks    Status On-going    Target Date 01/28/22      PT LONG TERM GOAL #3   Title Pt will decrease 5TSTS by at least 3 seconds in order to demonstrate clinically significant improvement in LE strength.    Baseline 09/20/20: 23.1s with minA+1 for strength and balance (Previous discharge 03/06/20: 16.1s), 12/14: 20 sec, 1/13: 22 sec.   01/02/2021= 16.05 sec with light UE support. 01/21/21 01/21/21 37.90 seconds without UE, with UE support 12.93 seconds: 02/06/2021= with 1 UE 17.93 sec. 02/18/2021= 21.33 sec without UE support. 05/06/2021= 15.3 sec with BUE Support and 21. 0 sec without UE support. 05/13/2021=15.0 sec with BUE support; 09/03/2021= 16 sec without UE support.    Time 12    Period Weeks    Status Achieved      PT LONG TERM GOAL #4   Title Pt will increase his self-selected 10MWT to >0.74 m/s in order to demonstrate improvement in gait speed and improved community ambulation    Baseline 09/20/20: self-selected: 14.4s = 0.69 m/s with rollator (Previous discharge 03/06/20: self-selected: 13.6s = 0.74 m/s), 12/14: 0.75 m/s, 1/13: 0.78 m/s. 01/02/2021= 0.85 m/s using 4WW, 01/16/21 with 4 WW .99 m/s. 02/06/2021= self selected = 0.93 m/s with 2ZD    Time 4    Period Weeks    Status Achieved      PT LONG TERM GOAL #5   Title Patient will increase six minute walk test distance to >1000 for progression to community ambulator and improve gait ability    Baseline 01/10/20  620 ft with rollator, 01/21/21  965 ft; 02/06/2021= 910 feet with 4WW.  02/18/2021= 950 feet with 4WW. 05/06/2021= will test next visit. 05/13/2021- Deferred secondary to increased left knee pain. 06/11/21: 732ft with 6UY *knee pain throughout test. 07/11/2021= 900 feet with use of 4WW.  08/13/2021- Deferred testing  secondary to patient not feeling well (recovering from recent bout of COVID). Will test next 1-2 sessions. 09/03/2021= 750 feet with use of 4WW (recovering from COVID  and strained left hamstring); 10/08/2021=890 feet with use of 4WW- VC not to drag left foot. Patient was distracted in gym stopping and talking a few times limiting his performance- requiring redirection. 11/05/2021-750 feet with 2XH- Increased dragging of left foot after 4 min mark- no complaint of pain only fatigue.    Time 12    Period Weeks    Status On-going    Target Date 01/28/22      Additional Long Term Goals   Additional Long Term Goals Yes      PT LONG TERM GOAL #6   Title Patient will demonstrate ability to walk with HHA +1 > 200 ft. without loss of balance on level surfaces to simulate walking his grandaughter down the aisle for her upcoming wedding in November 2022.    Baseline 05/13/2021- Patient currently ambulating short community distance with use of 4WW but desires to walk with his grandaughter with HHA for her wedding. 06/11/21: attempted 5 steps with HHA x1, deemed unsafe. 07/16/2021- Patient ambulated around 100 feet with HHA (arms interlocked) with PT to simulate walking daughter down aisle.  08/13/2021- Deferred testing secondary to patient not feeling well (recovering from recent bout of COVID). Will test next 1-2 sessions.  10/08/2021=Gait with HHA  on right and use of SPC on left- To simulate walking granddaughter down the aisle- approx 220 feet today total- with CGA and progressing with no report of pain. 11/04/2021- Patient was able to successfully walk his grandaughter down the aisle a couple of weeks ago.    Time 12    Period Weeks    Status Achieved    Target Date 11/05/21      PT LONG TERM GOAL #7   Title Pt will decrease 5TSTS  to 14 sec or less with BUE support on armrest in order to demonstrate clinically significant improvement in LE strength.    Baseline 05/13/2021=15.0 sec with BUE support; 26 sec without UE support. 06/11/21: 14.61 seconds with BUE support. 07/16/2021= 23 sec without UE support. 08/13/2021= 24. 2 sec with BUE  09/03/2021= 20.97 sec with B UE support -  Limited by left hamstring pain. 10/08/2021-19.0 sec with minimal BUE Support  upport. 11/05/2021= 16.31 sec with BUE support.    Time 12    Period Weeks    Status On-going    Target Date 01/28/22      PT LONG TERM GOAL #8   Title Patient will demonstrate modified independence with ambulation using SPC on level surfaces at > 300 feet for improved household and short community distances without loss of balance or excessive fatigue.    Baseline 11/05/2021= Patient able to ambulate approx 100 feet with SPC, close CGA- difficulty with gait sequencing and mild limp yet no report of increased left knee pain.                    Plan - 11/07/21 1507     Clinical Impression Statement Patient presented today with caregiver for focus on instruction/Review of exercises to be performed at home. Patient was not feeling great so today focused on exercises that he can perform at home when not feeling well- less stressful/extensive. He responded well with seated LE resistive exercises - able to use GTB well with instruction and caregiver was able to observe and discuss all exercises added to  HEP. Patient and caregiver Candie Mile) verbalized good understanding of exercises instructed in today and able to return demonstration well including use of bands and importance of slow controlled movements. He will benefit from further skilled PT to improve BLE strength, balance and functional mobility and overall improved quality of life    Personal Factors and Comorbidities Age;Comorbidity 3+    Comorbidities HTN, CVA    Examination-Activity Limitations Bend;Lift;Squat;Locomotion Level;Stairs;Stand    Examination-Participation Restrictions Community Activity;Driving;Laundry;Meal Prep;Shop    Stability/Clinical Decision Making Unstable/Unpredictable    Rehab Potential Fair    PT Frequency 2x / week    PT Duration 12 weeks    PT Treatment/Interventions ADLs/Self Care Home Management;Cryotherapy;Electrical  Stimulation;Iontophoresis 4mg /ml Dexamethasone;Moist Heat;Traction;Ultrasound;DME Instruction;Gait training;Stair training;Therapeutic activities;Therapeutic exercise;Functional mobility training;Balance training;Neuromuscular re-education;Patient/family education;Manual techniques;Dry needling;Vestibular;Joint Manipulations;Spinal Manipulations;Aquatic Therapy    PT Next Visit Plan Continue to focus on LE strengthening and balance, dynamic balance tasks- Add strengthening exercises to HEP next visit.    PT Home Exercise Plan no changes    Consulted and Agree with Plan of Care Patient;Family member/caregiver    Family Member Consulted Son             Patient will benefit from skilled therapeutic intervention in order to improve the following deficits and impairments:  Abnormal gait, Decreased balance, Decreased mobility, Difficulty walking, Decreased knowledge of precautions, Decreased safety awareness, Decreased activity tolerance, Decreased strength, Decreased endurance  Visit Diagnosis: Abnormality of gait and mobility  Difficulty in walking, not elsewhere classified  Muscle weakness (generalized)  Unsteadiness on feet  Other lack of coordination     Problem List Patient Active Problem List   Diagnosis Date Noted   Localized osteoarthritis of knees, bilateral 06/13/2021   Moderate protein-calorie malnutrition ([place]) 03/11/2021   Hemiparesis affecting left side as late effect of cerebrovascular accident (CVA) ([place]) 03/11/2021   Aneurysm, aorta, thoracic 12/21/2020   Calculus of bile duct without cholecystitis and without obstruction    Non-small cell carcinoma of lung, right ([place]) 09/07/2020   Mass of upper lobe of right lung 07/13/2020   Calculus of bile duct with cholecystitis without obstruction    Abnormal findings on imaging of biliary tract    Acute cholecystitis 05/02/2020   Chronic venous insufficiency 12/05/2019   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     CKD (chronic kidney disease), stage II    New onset atrial fibrillation ([place])    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   Goals of care, counseling/discussion    Palliative care by specialist    DNR (do not resuscitate) discussion    History of CVA (cerebrovascular accident) 05/22/2019   Persistent proteinuria 07/30/2018   Mobitz type 2 second degree heart block 07/13/2018   Chronic kidney disease, stage III (moderate) ([place]) 06/15/2018   Leg pain 05/06/2018   Lymphedema 05/06/2018   Coagulopathy ([place]) 07/07/2017   Lumbar spondylosis 03/10/2017   Elevated uric acid in blood 11/20/2016   BPH (benign prostatic hyperplasia) 05/29/2016   Peripheral vascular disease of lower extremity ([place]) 03/03/2016   Gallstone 12/31/2015   Splenic infarct 12/31/2015   Splenic vein thrombosis 11/27/2015   Atherosclerosis of aorta ([place]) 11/22/2015   Carotid artery narrowing 11/22/2015   Diverticulosis of colon 11/22/2015   Decreased creatinine clearance 11/22/2015   Arthritis, degenerative 11/22/2015   Lactose intolerance 11/22/2015   Basal cell carcinoma 11/22/2015   Essential hypertension 06/21/2015   Hyperlipemia 06/21/2015   GERD (gastroesophageal reflux disease) 06/21/2015   Calculus of kidney 11/18/2013  Lewis Moccasin, PT 11/07/2021, 3:34 PM  [facility] SERVICES [address] [place], [place], [address] Phone: [phone]   Fax:  [phone]  Name: Jose Castro MRN: [id number] Date of Birth: Jul 14, 1926

## 2021-11-12 ENCOUNTER — Ambulatory Visit: Payer: Medicare Other

## 2021-11-14 ENCOUNTER — Other Ambulatory Visit: Payer: Self-pay

## 2021-11-14 ENCOUNTER — Ambulatory Visit: Payer: Medicare Other

## 2021-11-14 DIAGNOSIS — R269 Unspecified abnormalities of gait and mobility: Secondary | ICD-10-CM

## 2021-11-14 DIAGNOSIS — R262 Difficulty in walking, not elsewhere classified: Secondary | ICD-10-CM

## 2021-11-14 DIAGNOSIS — R2681 Unsteadiness on feet: Secondary | ICD-10-CM

## 2021-11-14 DIAGNOSIS — M6281 Muscle weakness (generalized): Secondary | ICD-10-CM

## 2021-11-14 NOTE — Therapy (Signed)
[place] [facility] SERVICES [address] [place], [place], [address] Phone: [phone]   Fax:  [phone]  Physical Therapy Treatment  Patient Details  Name: Jose Castro MRN: [id number] Date of Birth: Jun 18, 1926 No data recorded  Encounter Date: 11/14/2021   PT End of Session - 11/14/21 1348     Visit Number 9    Number of Visits 68    Date for PT Re-Evaluation 11/05/21    Authorization Type [employer] Medicare    Authorization Time Period 08/13/2021- 11/05/2021; PN on 10/08/2021    Progress Note Due on Visit 70    PT Start Time 1345    PT Stop Time 1420    PT Time Calculation (min) 35 min    Equipment Utilized During Treatment Gait belt    Activity Tolerance Patient tolerated treatment well;No increased pain    Behavior During Therapy WFL for tasks assessed/performed             Past Medical History:  Diagnosis Date   Allergy    Penicillin   Arthritis    Basal cell carcinoma    BPH (benign prostatic hyperplasia)    Chronic kidney disease    had a kidney stone which per family was a cyst that was removed   Dysrhythmia    GERD (gastroesophageal reflux disease)    History of kidney stones    Hyperlipidemia    Hypertension    Left-sided low back pain with left-sided sciatica    Lung mass    Splenic vein thrombosis    Stroke ([place]) 05/22/2019   Thrombosis 12/2015   mural  area and no notation of heart attack    Past Surgical History:  Procedure Laterality Date   APPENDECTOMY     CATARACT EXTRACTION, BILATERAL     CYSTOSCOPY WITH INSERTION OF UROLIFT     ENDOSCOPIC RETROGRADE CHOLANGIOPANCREATOGRAPHY (ERCP) WITH PROPOFOL N/A 05/11/2020   Procedure: ENDOSCOPIC RETROGRADE CHOLANGIOPANCREATOGRAPHY (ERCP) WITH PROPOFOL;  Surgeon: [name], MD;  Location: [facility] ENDOSCOPY;  Service: Endoscopy;  Laterality: N/A;   ERCP N/A 10/23/2020   Procedure: ENDOSCOPIC RETROGRADE CHOLANGIOPANCREATOGRAPHY (ERCP);  Surgeon: [name], MD;   Location: [facility] ENDOSCOPY;  Service: Endoscopy;  Laterality: N/A;   EYE SURGERY  08/2018   IR EXCHANGE BILIARY DRAIN  05/04/2020   KIDNEY STONE SURGERY     KNEE SURGERY     PACEMAKER INSERTION N/A 07/13/2018   Procedure: INSERTION PACEMAKER-DUEL CHAMBER INITIAL IMPLANT;  Surgeon: Isaias Cowman, MD;  Location: [facility] ORS;  Service: Cardiovascular;  Laterality: N/A;   SPINE SURGERY      There were no vitals filed for this visit.    INTERVENTIONS:   *Patient instructed in standing LE strengthening exercises at support bar for progression of HEP to transition from skilled PT to home program.  Therex:   -Standing Hip march -Standing Hip ext -Standing hip abd -Standing Knee flex -Standing Minisquats -Standing calf raises -Standing toe raises Patient fatigued after performing 2 sets of 10 reps with seated rest break after 2nd set of each exercises. Patient performed well with VC, TC, and visual cues.   Access Code: [email or web address] URL: [email or web address] Date: 11/14/2021 Prepared by: Sande Brothers  Exercises Standing March with Counter Support - 1 x daily - 3 x weekly - 3 sets - 10 reps - 2 sec hold Standing Hip Abduction with Counter Support - 1 x daily - 3 x weekly - 3 sets - 10 reps - 2 sec hold Standing Hip Extension  with Counter Support - 1 x daily - 3 x weekly - 3 sets - 10 reps - 2 sec hold Standing Knee Flexion with Counter Support - 1 x daily - 3 x weekly - 3 sets - 10 reps - 2 sec hold Mini Squat with Counter Support - 1 x daily - 3 x weekly - 3 sets - 10 reps - 2 sec hold Heel Raises with Counter Support - 1 x daily - 3 x weekly - 3 sets - 10 reps - 2 sec hold Sit to Stand - 1 x daily - 3 x weekly - 2 sets - 10 reps Standing Ankle Dorsiflexion with Chair Support - 1 x daily - 3 x weekly - 3 sets - 10 reps - 2 sec hold     Patient arrived to clinic stating he was tired and sore but otherwise okay and motivated. He presented with fatigue while performed  standing therex but able to follow all cues well without incidence. He was able to complete 2 rounds of each exercises and caregiver present for education in home program. He will benefit from further skilled PT to improve BLE strength, balance and functional mobility and overall improved quality of life                     PT Education - 11/14/21 1347     Education Details Exercise technique    Person(s) Educated Patient;Caregiver(s)    Methods Explanation;Demonstration;Tactile cues;Verbal cues    Comprehension Verbalized understanding;Returned demonstration;Verbal cues required;Need further instruction;Tactile cues required              PT Short Term Goals - 01/21/21 1438       PT SHORT TERM GOAL #1   Title Pt will be independent with HEP in order to improve strength and balance in order to decrease fall risk and improve function at home.    Baseline 01/02/2021- Patient verbalize understanding of walking and seated exercises for home program. 01/21/21 patient has been more active and understands HEP.    Time 6    Period Weeks    Status Achieved    Target Date 01/10/21               PT Long Term Goals - 11/05/21 1414       PT LONG TERM GOAL #1   Title Pt will improve BERG by at least 3 points in order to demonstrate clinically significant improvement in balance.    Baseline 09/20/20: 32/56 (Previous discharge: 03/06/20: 46/56), 12/14: 32/56, 1/13: 31/56, 01/16/21 35/56, 02/06/2021=36/56. 02/18/2021= 37/56.03/20/2021- Deferred secondary to left knee aching- will attempt next visit. 05/06/2021= 39/56; 05/13/2021= 36/56 *patient having more left knee pain. 07/16/2021= 40/56    Time 12    Period Weeks    Status Achieved      PT LONG TERM GOAL #2   Title Pt will decrease TUG to below 14 seconds/decrease in order to demonstrate decreased fall risk.    Baseline 09/20/20: 19.2s, 12/14: 17.48, 1/13: 16.65 sec. 01/02/2021=15.75 sec using 4WW, 01/21/21  14.37 seconds. 02/06/2021=  14.92 sec using 4WW. 02/18/2021= 14.5 sec using 4WW. 03/20/2021- Deferred secondary to left knee aching- will attempt next visit. 05/06/2021= 16.32 sec avg using 4WW. 05/13/2021= 16.5 sec with 4WW *increased left knee pain. 06/11/21: 20.11 seconds (increased time to turn) using 4WW;  08/13/2021- Deferred testing secondary to patient not feeling well (recovering from recent bout of COVID). Will test next 1-2 sessions. 09/03/2021= 16.83 sec using 4WW 11/04/2021=  19.22 sec    Time 12    Period Weeks    Status On-going    Target Date 01/28/22      PT LONG TERM GOAL #3   Title Pt will decrease 5TSTS by at least 3 seconds in order to demonstrate clinically significant improvement in LE strength.    Baseline 09/20/20: 23.1s with minA+1 for strength and balance (Previous discharge 03/06/20: 16.1s), 12/14: 20 sec, 1/13: 22 sec.   01/02/2021= 16.05 sec with light UE support. 01/21/21 01/21/21 37.90 seconds without UE, with UE support 12.93 seconds: 02/06/2021= with 1 UE 17.93 sec. 02/18/2021= 21.33 sec without UE support. 05/06/2021= 15.3 sec with BUE Support and 21. 0 sec without UE support. 05/13/2021=15.0 sec with BUE support; 09/03/2021= 16 sec without UE support.    Time 12    Period Weeks    Status Achieved      PT LONG TERM GOAL #4   Title Pt will increase his self-selected 10MWT to >0.74 m/s in order to demonstrate improvement in gait speed and improved community ambulation    Baseline 09/20/20: self-selected: 14.4s = 0.69 m/s with rollator (Previous discharge 03/06/20: self-selected: 13.6s = 0.74 m/s), 12/14: 0.75 m/s, 1/13: 0.78 m/s. 01/02/2021= 0.85 m/s using 4WW, 01/16/21 with 4 WW .99 m/s. 02/06/2021= self selected = 0.93 m/s with 3AL    Time 4    Period Weeks    Status Achieved      PT LONG TERM GOAL #5   Title Patient will increase six minute walk test distance to >1000 for progression to community ambulator and improve gait ability    Baseline 01/10/20  620 ft with rollator, 01/21/21  965 ft; 02/06/2021= 910 feet with  4WW.  02/18/2021= 950 feet with 4WW. 05/06/2021= will test next visit. 05/13/2021- Deferred secondary to increased left knee pain. 06/11/21: 714ft with 9FX *knee pain throughout test. 07/11/2021= 900 feet with use of 4WW.  08/13/2021- Deferred testing secondary to patient not feeling well (recovering from recent bout of COVID). Will test next 1-2 sessions. 09/03/2021= 750 feet with use of 4WW (recovering from COVID and strained left hamstring); 10/08/2021=890 feet with use of 4WW- VC not to drag left foot. Patient was distracted in gym stopping and talking a few times limiting his performance- requiring redirection. 11/05/2021-750 feet with 9KW- Increased dragging of left foot after 4 min mark- no complaint of pain only fatigue.    Time 12    Period Weeks    Status On-going    Target Date 01/28/22      Additional Long Term Goals   Additional Long Term Goals Yes      PT LONG TERM GOAL #6   Title Patient will demonstrate ability to walk with HHA +1 > 200 ft. without loss of balance on level surfaces to simulate walking his grandaughter down the aisle for her upcoming wedding in November 2022.    Baseline 05/13/2021- Patient currently ambulating short community distance with use of 4WW but desires to walk with his grandaughter with HHA for her wedding. 06/11/21: attempted 5 steps with HHA x1, deemed unsafe. 07/16/2021- Patient ambulated around 100 feet with HHA (arms interlocked) with PT to simulate walking daughter down aisle.  08/13/2021- Deferred testing secondary to patient not feeling well (recovering from recent bout of COVID). Will test next 1-2 sessions.  10/08/2021=Gait with HHA  on right and use of SPC on left- To simulate walking granddaughter down the aisle- approx 220 feet today total- with CGA and progressing with no  report of pain. 11/04/2021- Patient was able to successfully walk his grandaughter down the aisle a couple of weeks ago.    Time 12    Period Weeks    Status Achieved    Target Date 11/05/21       PT LONG TERM GOAL #7   Title Pt will decrease 5TSTS  to 14 sec or less with BUE support on armrest in order to demonstrate clinically significant improvement in LE strength.    Baseline 05/13/2021=15.0 sec with BUE support; 26 sec without UE support. 06/11/21: 14.61 seconds with BUE support. 07/16/2021= 23 sec without UE support. 08/13/2021= 24. 2 sec with BUE  09/03/2021= 20.97 sec with B UE support - Limited by left hamstring pain. 10/08/2021-19.0 sec with minimal BUE Support  upport. 11/05/2021= 16.31 sec with BUE support.    Time 12    Period Weeks    Status On-going    Target Date 01/28/22      PT LONG TERM GOAL #8   Title Patient will demonstrate modified independence with ambulation using SPC on level surfaces at > 300 feet for improved household and short community distances without loss of balance or excessive fatigue.    Baseline 11/05/2021= Patient able to ambulate approx 100 feet with SPC, close CGA- difficulty with gait sequencing and mild limp yet no report of increased left knee pain.                   Plan - 11/14/21 1404     Clinical Impression Statement Patient arrived to clinic stating he was tired and sore but otherwise okay and motivated. He presented with fatigue while performed standing therex but able to follow all cues well without incidence. He was able to complete 2 rounds of each exercises and caregiver present for education in home program. He will benefit from further skilled PT to improve BLE strength, balance and functional mobility and overall improved quality of life    Personal Factors and Comorbidities Age;Comorbidity 3+    Comorbidities HTN, CVA    Examination-Activity Limitations Bend;Lift;Squat;Locomotion Level;Stairs;Stand    Examination-Participation Restrictions Community Activity;Driving;Laundry;Meal Prep;Shop    Stability/Clinical Decision Making Unstable/Unpredictable    Rehab Potential Fair    PT Frequency 2x / week    PT Duration 12 weeks     PT Treatment/Interventions ADLs/Self Care Home Management;Cryotherapy;Electrical Stimulation;Iontophoresis 4mg /ml Dexamethasone;Moist Heat;Traction;Ultrasound;DME Instruction;Gait training;Stair training;Therapeutic activities;Therapeutic exercise;Functional mobility training;Balance training;Neuromuscular re-education;Patient/family education;Manual techniques;Dry needling;Vestibular;Joint Manipulations;Spinal Manipulations;Aquatic Therapy    PT Next Visit Plan Continue to focus on LE strengthening and balance, dynamic balance tasks-    PT Home Exercise Plan Access Code: [email or web address]    Consulted and Agree with Plan of Care Patient;Family member/caregiver    Family Member Consulted Son             Patient will benefit from skilled therapeutic intervention in order to improve the following deficits and impairments:  Abnormal gait, Decreased balance, Decreased mobility, Difficulty walking, Decreased knowledge of precautions, Decreased safety awareness, Decreased activity tolerance, Decreased strength, Decreased endurance  Visit Diagnosis: Abnormality of gait and mobility  Difficulty in walking, not elsewhere classified  Muscle weakness (generalized)  Unsteadiness on feet     Problem List Patient Active Problem List   Diagnosis Date Noted   Localized osteoarthritis of knees, bilateral 06/13/2021   Moderate protein-calorie malnutrition ([place]) 03/11/2021   Hemiparesis affecting left side as late effect of cerebrovascular accident (CVA) ([place]) 03/11/2021   Aneurysm, aorta, thoracic 12/21/2020   Calculus of bile duct  without cholecystitis and without obstruction    Non-small cell carcinoma of lung, right ([place]) 09/07/2020   Mass of upper lobe of right lung 07/13/2020   Calculus of bile duct with cholecystitis without obstruction    Abnormal findings on imaging of biliary tract    Acute cholecystitis 05/02/2020   Chronic venous insufficiency 12/05/2019   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    CKD (chronic kidney disease), stage II    New onset atrial fibrillation ([place])    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   Goals of care, counseling/discussion    Palliative care by specialist    DNR (do not resuscitate) discussion    History of CVA (cerebrovascular accident) 05/22/2019   Persistent proteinuria 07/30/2018   Mobitz type 2 second degree heart block 07/13/2018   Chronic kidney disease, stage III (moderate) ([place]) 06/15/2018   Leg pain 05/06/2018   Lymphedema 05/06/2018   Coagulopathy ([place]) 07/07/2017   Lumbar spondylosis 03/10/2017   Elevated uric acid in blood 11/20/2016   BPH (benign prostatic hyperplasia) 05/29/2016   Peripheral vascular disease of lower extremity ([place]) 03/03/2016   Gallstone 12/31/2015   Splenic infarct 12/31/2015   Splenic vein thrombosis 11/27/2015   Atherosclerosis of aorta ([place]) 11/22/2015   Carotid artery narrowing 11/22/2015   Diverticulosis of colon 11/22/2015   Decreased creatinine clearance 11/22/2015   Arthritis, degenerative 11/22/2015   Lactose intolerance 11/22/2015   Basal cell carcinoma 11/22/2015   Essential hypertension 06/21/2015   Hyperlipemia 06/21/2015   GERD (gastroesophageal reflux disease) 06/21/2015   Calculus of kidney 11/18/2013    Lewis Moccasin, PT 11/14/2021, 2:32 PM  [place], [place], [address] Phone: [phone]   Fax:  [phone]  Name: KHAMANI FAIRLEY MRN: [id number] Date of Birth: [birth year]

## 2021-11-18 ENCOUNTER — Encounter (INDEPENDENT_AMBULATORY_CARE_PROVIDER_SITE_OTHER): Payer: Self-pay | Admitting: Vascular Surgery

## 2021-11-18 ENCOUNTER — Ambulatory Visit (INDEPENDENT_AMBULATORY_CARE_PROVIDER_SITE_OTHER): Payer: Medicare Other

## 2021-11-18 ENCOUNTER — Other Ambulatory Visit: Payer: Self-pay

## 2021-11-18 ENCOUNTER — Ambulatory Visit (INDEPENDENT_AMBULATORY_CARE_PROVIDER_SITE_OTHER): Payer: Medicare Other | Admitting: Vascular Surgery

## 2021-11-18 VITALS — BP 108/70 | HR 82 | Resp 16 | Wt 152.6 lb

## 2021-11-18 DIAGNOSIS — I6523 Occlusion and stenosis of bilateral carotid arteries: Secondary | ICD-10-CM

## 2021-11-18 DIAGNOSIS — I712 Thoracic aortic aneurysm, without rupture, unspecified: Secondary | ICD-10-CM

## 2021-11-18 DIAGNOSIS — I872 Venous insufficiency (chronic) (peripheral): Secondary | ICD-10-CM

## 2021-11-18 DIAGNOSIS — I10 Essential (primary) hypertension: Secondary | ICD-10-CM

## 2021-11-18 DIAGNOSIS — I70213 Atherosclerosis of native arteries of extremities with intermittent claudication, bilateral legs: Secondary | ICD-10-CM

## 2021-11-18 DIAGNOSIS — I739 Peripheral vascular disease, unspecified: Secondary | ICD-10-CM | POA: Diagnosis not present

## 2021-11-18 NOTE — Progress Notes (Signed)
MRN : [id number]  Jose Castro is a 87 y.o. (03/05/1926) male who presents with chief complaint of check circulation.  History of Present Illness:   The patient presents today for follow-up evaluation regarding leg right lower extremity numbness.  The patient's daughter was concerned due to complaints of numbness in his right foot.  The patient notes that the numbness happens more so at night and it is in his toes and foot area.  He notes that this pain comes and goes.  He notes that if he stands or walks or moves his foot around the pain tends to go away.  This pain does not happen consistently.  He denies any claudication or rest pain like symptoms.  The patient has had a previous history of a spinal fusion about 7 to 8 years ago.  He is also underwent radiation recently for a nodule in his lung.  He denies any fever, chills, nausea or vomiting.  He denies any discoloration.   ABI's Rt=1.27 and Lt=1.25 (previous ABI Rt=1.19 and Lt=0.95)   Current Meds  Medication Sig   acetaminophen (TYLENOL) 325 MG tablet Take 2 tablets (650 mg total) by mouth every 4 (four) hours as needed for mild pain (or temp > 37.5 C (99.5 F)).   amoxicillin-clavulanate (AUGMENTIN) 500-125 MG tablet SMARTSIG:1 Tablet(s) By Mouth Every 12 Hours   apixaban (ELIQUIS) 2.5 MG TABS tablet Take 1 tablet (2.5 mg total) by mouth 2 (two) times daily.   Cholecalciferol (VITAMIN D3) 50 MCG (2000 UT) TABS Take 2,000 Units by mouth daily.   Ensure Max Protein (ENSURE MAX PROTEIN) LIQD Take 330 mLs (11 oz total) by mouth 2 (two) times daily.   magnesium oxide (MAG-OX) 400 MG tablet Take 1 tablet (400 mg total) by mouth daily.   metoprolol succinate (TOPROL XL) 25 MG 24 hr tablet Take 1 tablet (25 mg total) by mouth daily.   Multiple Vitamins-Minerals (ZINC PO) Take 500 mg by mouth 2 (two) times daily.   Omega-3 Fatty Acids (FISH OIL) 1000 MG CAPS Take 1,000 mg by mouth daily.    pantoprazole (PROTONIX) 40 MG tablet TAKE ONE  TABLET EVERY DAY   potassium chloride SA (KLOR-CON) 20 MEQ tablet Take 1 tablet (20 mEq total) by mouth daily.   rosuvastatin (CRESTOR) 40 MG tablet Take 1 tablet (40 mg total) by mouth daily at 6 PM.   ursodiol (ACTIGALL) 300 MG capsule Take 300 mg by mouth daily.    Past Medical History:  Diagnosis Date   Allergy    Penicillin   Arthritis    Basal cell carcinoma    BPH (benign prostatic hyperplasia)    Chronic kidney disease    had a kidney stone which per family was a cyst that was removed   Dysrhythmia    GERD (gastroesophageal reflux disease)    History of kidney stones    Hyperlipidemia    Hypertension    Left-sided low back pain with left-sided sciatica    Lung mass    Splenic vein thrombosis    Stroke ([employer]) 05/22/2019   Thrombosis 12/2015   mural  area and no notation of heart attack    Past Surgical History:  Procedure Laterality Date   APPENDECTOMY     CATARACT EXTRACTION, BILATERAL     CYSTOSCOPY WITH INSERTION OF UROLIFT     ENDOSCOPIC RETROGRADE CHOLANGIOPANCREATOGRAPHY (ERCP) WITH PROPOFOL N/A 05/11/2020   Procedure: ENDOSCOPIC RETROGRADE CHOLANGIOPANCREATOGRAPHY (ERCP) WITH PROPOFOL;  Surgeon: [name], MD;  Location:  [facility];  Service: Endoscopy;  Laterality: N/A;   ERCP N/A 10/23/2020   Procedure: ENDOSCOPIC RETROGRADE CHOLANGIOPANCREATOGRAPHY (ERCP);  Surgeon: [name], MD;  Location: [facility];  Service: Endoscopy;  Laterality: N/A;   EYE SURGERY  08/2018   IR EXCHANGE BILIARY DRAIN  05/04/2020   KIDNEY STONE SURGERY     KNEE SURGERY     PACEMAKER INSERTION N/A 07/13/2018   Procedure: INSERTION PACEMAKER-DUEL CHAMBER INITIAL IMPLANT;  Surgeon: Isaias Cowman, MD;  Location: [facility];  Service: Cardiovascular;  Laterality: N/A;   SPINE SURGERY      Social History Social History   Tobacco Use   Smoking status: Former    Packs/day: 0.00    Years: 16.00    Pack years: 0.00    Types: Cigarettes    Start date: 10/21/1944    Quit  date: 10/21/1960    Years since quitting: 61.1   Smokeless tobacco: Never  Vaping Use   Vaping Use: Never used  Substance Use Topics   Alcohol use: Yes    Alcohol/week: 3.0 standard drinks    Types: 3 Standard drinks or equivalent per week    Comment: once a week when he goes out to dinner-socially   Drug use: No    Family History Family History  Problem Relation Age of Onset   Cancer Brother        bladder cancer with mets   Heart disease Mother    Aortic aneurysm Mother    Heart attack Maternal Aunt    Heart attack Maternal Uncle     Allergies  Allergen Reactions   Penicillins Itching and Swelling    Pt reported arm rash and swelling with penicillin administration while he was in the [employer].  However, pt was able to tolerate IV Unasyn without any problem, so penicillin allergy unlikely to be real or serious.  05/14/2020.     REVIEW OF SYSTEMS (Negative unless checked)  Constitutional: [] Weight loss  [] Fever  [] Chills Cardiac: [] Chest pain   [] Chest pressure   [] Palpitations   [] Shortness of breath when laying flat   [] Shortness of breath with exertion. Vascular:  [x] Pain in legs with walking   [] Pain in legs at rest  [] History of DVT   [] Phlebitis   [] Swelling in legs   [] Varicose veins   [] Non-healing ulcers Pulmonary:   [] Uses home oxygen   [] Productive cough   [] Hemoptysis   [] Wheeze  [] COPD   [] Asthma Neurologic:  [] Dizziness   [] Seizures   [] History of stroke   [] History of TIA  [] Aphasia   [] Vissual changes   [] Weakness or numbness in arm   [] Weakness or numbness in leg Musculoskeletal:   [] Joint swelling   [] Joint pain   [] Low back pain Hematologic:  [] Easy bruising  [] Easy bleeding   [] Hypercoagulable state   [] Anemic Gastrointestinal:  [] Diarrhea   [] Vomiting  [] Gastroesophageal reflux/heartburn   [] Difficulty swallowing. Genitourinary:  [] Chronic kidney disease   [] Difficult urination  [] Frequent urination   [] Blood in urine Skin:  [] Rashes   [] Ulcers   Psychological:  [] History of anxiety   []  History of major depression.  Physical Examination  Vitals:   11/18/21 1445  BP: 108/70  Pulse: 82  Resp: 16  Weight: 152 lb 9.6 oz (69.2 kg)   Body mass index is 21.9 kg/m. Gen: WD/WN, NAD Head: [place]/AT, No temporalis wasting.  Ear/Nose/Throat: Hearing grossly intact, nares w/o erythema or drainage Eyes: PER, EOMI, sclera nonicteric.  Neck: Supple, no masses.  No bruit or JVD.  Pulmonary:  Good air movement, no audible wheezing, no use of accessory muscles.  Cardiac: RRR, normal S1, S2, no Murmurs. Vascular:   carotid bruit Vessel Right Left  Radial Palpable Palpable  Carotid Palpable Palpable  PT Not Palpable Not Palpable  DP Not Palpable Not Palpable  Gastrointestinal: soft, non-distended. No guarding/no peritoneal signs.  Musculoskeletal: M/S 5/5 throughout.  No visible deformity.  Neurologic: CN 2-12 intact. Pain and light touch intact in extremities.  Symmetrical.  Speech is fluent. Motor exam as listed above. Psychiatric: Judgment intact, Mood & affect appropriate for pt's clinical situation. Dermatologic: No rashes or ulcers noted.  No changes consistent with cellulitis.   CBC Lab Results  Component Value Date   WBC 9.0 08/16/2021   HGB 13.2 08/16/2021   HCT 41.1 08/16/2021   MCV 92.6 08/16/2021   PLT 329 08/16/2021    BMET    Component Value Date/Time   NA 140 09/19/2021 1506   NA 144 11/19/2016 0955   NA 138 11/27/2013 0819   K 4.7 09/19/2021 1506   K 4.1 11/27/2013 0819   CL 105 09/19/2021 1506   CL 107 11/27/2013 0819   CO2 27 09/19/2021 1506   CO2 23 11/27/2013 0819   GLUCOSE 78 09/19/2021 1506   GLUCOSE 101 (H) 11/27/2013 0819   BUN 23 09/19/2021 1506   BUN 25 11/19/2016 0955   BUN 22 (H) 11/27/2013 0819   CREATININE 1.43 (H) 09/19/2021 1506   CALCIUM 9.4 09/19/2021 1506   CALCIUM 8.2 (L) 11/27/2013 0819   GFRNONAA 54 (L) 06/25/2021 1017   GFRNONAA 53 (L) 03/11/2021 1419   GFRAA 62 03/11/2021 1419    CrCl cannot be calculated (Patient's most recent lab result is older than the maximum 21 days allowed.).  COAG Lab Results  Component Value Date   INR 2.0 (H) 05/02/2020   INR 1.1 05/21/2019   INR 1.04 07/06/2018    Radiology No results found.   Assessment/Plan 1. Atherosclerosis of native artery of both lower extremities with intermittent claudication (HCC)  Recommend:  The patient has evidence of atherosclerosis of the lower extremities with claudication.  The patient does not voice lifestyle limiting changes at this point in time.  Noninvasive studies do not suggest clinically significant change.  No invasive studies, angiography or surgery at this time The patient should continue walking and begin a more formal exercise program.  The patient should continue antiplatelet therapy and aggressive treatment of the lipid abnormalities  No changes in the patient's medications at this time  The patient should continue wearing graduated compression socks 10-15 mmHg strength to control the mild edema.   - VAS [place] ABI WITH/WO TBI; Future  2. Thoracic aortic aneurysm without rupture, unspecified part Continue semiannual follow up  3. Chronic venous insufficiency No surgery or intervention at this point in time.  I have reviewed my discussion with the patient regarding venous insufficiency and why it causes symptoms. I have discussed with the patient the chronic skin changes that accompany venous insufficiency and the long term sequela such as ulceration. Patient will contnue wearing graduated compression stockings on a daily basis, as this has provided excellent control of his edema. The patient will put the stockings on first thing in the morning and removing them in the evening. The patient is reminded not to sleep in the stockings.  In addition, behavioral modification including elevation during the day will be initiated. Exercise is strongly encouraged.  Given the patient's  good control and lack of  any problems regarding the venous insufficiency and lymphedema a lymph pump in not need at this time.  The patient voices agreement with this plan.   4. Bilateral carotid artery stenosis Recommend:  Given the patient's asymptomatic subcritical stenosis no further invasive testing or surgery at this time.  Continue antiplatelet therapy as prescribed Continue management of CAD, HTN and Hyperlipidemia Healthy heart diet,  encouraged exercise at least 4 times per week Follow up in 12 months with duplex ultrasound and physical exam   - VAS US CAROTID; Future  5. Essential hypertension Continue antihypertensive medications as already ordered, these medications have been reviewed and there are no changes at this time.     Hortencia Pilar, MD  11/18/2021 2:56 PM

## 2021-11-19 ENCOUNTER — Ambulatory Visit: Payer: Medicare Other

## 2021-11-21 ENCOUNTER — Ambulatory Visit: Payer: Medicare Other

## 2021-11-21 ENCOUNTER — Other Ambulatory Visit: Payer: Self-pay

## 2021-11-21 DIAGNOSIS — R269 Unspecified abnormalities of gait and mobility: Secondary | ICD-10-CM | POA: Diagnosis not present

## 2021-11-21 DIAGNOSIS — R2681 Unsteadiness on feet: Secondary | ICD-10-CM

## 2021-11-21 DIAGNOSIS — R262 Difficulty in walking, not elsewhere classified: Secondary | ICD-10-CM

## 2021-11-21 DIAGNOSIS — M6281 Muscle weakness (generalized): Secondary | ICD-10-CM

## 2021-11-21 NOTE — Therapy (Signed)
[place] MAIN [facility] SERVICES [address] [place], [place], [address] Phone: [phone]   Fax:  [phone]  Physical Therapy Treatment  Patient Details  Name: Jose Castro MRN: [id number] Date of Birth: 1926/08/02 No data recorded  Encounter Date: 11/21/2021   PT End of Session - 11/21/21 1504     Visit Number 63    Number of Visits 58    Date for PT Re-Evaluation 11/05/21    Authorization Type [employer] Medicare    Authorization Time Period 08/13/2021- 11/05/2021; PN on 10/08/2021    Progress Note Due on Visit 70    PT Start Time 1348    PT Stop Time 1428    PT Time Calculation (min) 40 min    Equipment Utilized During Treatment Gait belt    Activity Tolerance Patient tolerated treatment well;No increased pain    Behavior During Therapy WFL for tasks assessed/performed             Past Medical History:  Diagnosis Date   Allergy    Penicillin   Arthritis    Basal cell carcinoma    BPH (benign prostatic hyperplasia)    Chronic kidney disease    had a kidney stone which per family was a cyst that was removed   Dysrhythmia    GERD (gastroesophageal reflux disease)    History of kidney stones    Hyperlipidemia    Hypertension    Left-sided low back pain with left-sided sciatica    Lung mass    Splenic vein thrombosis    Stroke ([place]) 05/22/2019   Thrombosis 12/2015   mural  area and no notation of heart attack    Past Surgical History:  Procedure Laterality Date   APPENDECTOMY     CATARACT EXTRACTION, BILATERAL     CYSTOSCOPY WITH INSERTION OF UROLIFT     ENDOSCOPIC RETROGRADE CHOLANGIOPANCREATOGRAPHY (ERCP) WITH PROPOFOL N/A 05/11/2020   Procedure: ENDOSCOPIC RETROGRADE CHOLANGIOPANCREATOGRAPHY (ERCP) WITH PROPOFOL;  Surgeon: [name], MD;  Location: [facility] ENDOSCOPY;  Service: Endoscopy;  Laterality: N/A;   ERCP N/A 10/23/2020   Procedure: ENDOSCOPIC RETROGRADE CHOLANGIOPANCREATOGRAPHY (ERCP);  Surgeon: [name], MD;   Location: [facility] ENDOSCOPY;  Service: Endoscopy;  Laterality: N/A;   EYE SURGERY  08/2018   IR EXCHANGE BILIARY DRAIN  05/04/2020   KIDNEY STONE SURGERY     KNEE SURGERY     PACEMAKER INSERTION N/A 07/13/2018   Procedure: INSERTION PACEMAKER-DUEL CHAMBER INITIAL IMPLANT;  Surgeon: Isaias Cowman, MD;  Location: [facility] ORS;  Service: Cardiovascular;  Laterality: N/A;   SPINE SURGERY      There were no vitals filed for this visit.   Subjective Assessment - 11/21/21 1501     Subjective Patient states doing well today and reports compliance with HEP.    Pertinent History Pt referred for deconditioning and gait instability. He has had a decline since he last finished therapy. Pt had a hospital admission from 05/02/20 to 05/14/20 for acute cholecystitis and is now s/p cholecystostomy drain placement. Drain is still in place and pt is waiting to hear when they plan to remove it. Pt discharged to SNF and then returned home. In addition pt underwent radiation therapy for a RUL lung mass/cancer and had his last radiation treatment in September of 2021. He reports that he has continued to walk at home as his primary exercise. Pt was previously seen at this clinic for PT/OT due to weakness/imbalance s/p CVA. Prior history from 09/21/19: Pt is a 87 year old  male who presents with imbalance and difficulty with gait following a right frontal CVA with left hemiparesis on 05/22/19.  He has completed inpatient and home health PT, and now presents for OP PT.  He was discharged from inpatient rehab at a supervision level of assistance, ambulating with a RW.  His family notes that he is impulsive and will try to ambulate without his RW at home.  Family is currently providing 24/7 supervision.    Currently in Pain? Yes    Pain Score 4     Pain Location Knee    Pain Orientation Left;Anterior    Pain Descriptors / Indicators Aching;Sore    Pain Type Chronic pain    Pain Onset More than a month ago    Pain Frequency  Constant              INTERVENTIONS:   Neuromuscular re-education:   Dynamic standing on blue airex paid with resistive march in // bars using GTB- alt LE x 15 reps- Cues to try to pick up knees toward ceiling using 1 UE support for balance.  Dynamic hip march (standing on blue airex pad) without UE support initially- very unsteady -requiring 1 UE support in // bars 2 sets of 15 reps  Static stand with feet apart on firm surface- EO then EC- 20 sec each x 3 each  Static stand with feet together - EO then EC- 20 sec x 3 each. Increased overall sway with eyes closed yet no loss of balance.   1/2 tandem standing with EO then EC- Increased unsteadiness with EC- requiring 1 UE reaching for support.   Side stepping without UE support - X length of bar x 3 trials- Minimal width of step with decreased step on left.   Alt LE step tap onto blue airex pad without UE support x 15 reps each- quick step on right due to difficulty weight bearing through left LE.  Education provided throughout session via VC/TC and demonstration to facilitate movement at target joints and correct muscle activation for all testing and exercises performed.   Added below to HEP:      Access Code: [email or web address] URL: [email or web address] Date: 11/21/2021 Prepared by: Sande Brothers  Exercises Romberg Stance - 1 x daily - 2 x weekly - 3 sets - 20 hold Romberg Stance with Eyes Closed - 1 x daily - 7 x weekly - 3 sets - 20 hold Standing Balance in Corner with Eyes Closed - 1 x daily - 2 x weekly - 3 sets - 20 hold Standing Balance with Eyes Closed - 1 x daily - 2 x weekly - 3 sets - 20 hold Half Tandem Stance Balance with Eyes Closed - 1 x daily - 2 x weekly - 3 sets - 20 hold Side Stepping with Counter Support - 1 x daily - 2 x weekly - 3 sets Step Taps on High Step - 1 x daily - 2 x weekly - 2 sets - 10 reps                PT Education - 11/21/21 1502     Education Details  Balance exercise strategies to ensure safety and decrease risk of falling.    Person(s) Educated Patient;Caregiver(s)    Methods Explanation;Demonstration;Tactile cues;Verbal cues;Handout    Comprehension Verbalized understanding;Verbal cues required;Need further instruction;Returned demonstration;Tactile cues required              PT Short Term Goals - 01/21/21 1438  PT SHORT TERM GOAL #1   Title Pt will be independent with HEP in order to improve strength and balance in order to decrease fall risk and improve function at home.    Baseline 01/02/2021- Patient verbalize understanding of walking and seated exercises for home program. 01/21/21 patient has been more active and understands HEP.    Time 6    Period Weeks    Status Achieved    Target Date 01/10/21               PT Long Term Goals - 11/05/21 1414       PT LONG TERM GOAL #1   Title Pt will improve BERG by at least 3 points in order to demonstrate clinically significant improvement in balance.    Baseline 09/20/20: 32/56 (Previous discharge: 03/06/20: 46/56), 12/14: 32/56, 1/13: 31/56, 01/16/21 35/56, 02/06/2021=36/56. 02/18/2021= 37/56.03/20/2021- Deferred secondary to left knee aching- will attempt next visit. 05/06/2021= 39/56; 05/13/2021= 36/56 *patient having more left knee pain. 07/16/2021= 40/56    Time 12    Period Weeks    Status Achieved      PT LONG TERM GOAL #2   Title Pt will decrease TUG to below 14 seconds/decrease in order to demonstrate decreased fall risk.    Baseline 09/20/20: 19.2s, 12/14: 17.48, 1/13: 16.65 sec. 01/02/2021=15.75 sec using 4WW, 01/21/21  14.37 seconds. 02/06/2021= 14.92 sec using 4WW. 02/18/2021= 14.5 sec using 4WW. 03/20/2021- Deferred secondary to left knee aching- will attempt next visit. 05/06/2021= 16.32 sec avg using 4WW. 05/13/2021= 16.5 sec with 4WW *increased left knee pain. 06/11/21: 20.11 seconds (increased time to turn) using 4WW;  08/13/2021- Deferred testing secondary to patient not  feeling well (recovering from recent bout of COVID). Will test next 1-2 sessions. 09/03/2021= 16.83 sec using 4WW 11/04/2021= 19.22 sec    Time 12    Period Weeks    Status On-going    Target Date 01/28/22      PT LONG TERM GOAL #3   Title Pt will decrease 5TSTS by at least 3 seconds in order to demonstrate clinically significant improvement in LE strength.    Baseline 09/20/20: 23.1s with minA+1 for strength and balance (Previous discharge 03/06/20: 16.1s), 12/14: 20 sec, 1/13: 22 sec.   01/02/2021= 16.05 sec with light UE support. 01/21/21 01/21/21 37.90 seconds without UE, with UE support 12.93 seconds: 02/06/2021= with 1 UE 17.93 sec. 02/18/2021= 21.33 sec without UE support. 05/06/2021= 15.3 sec with BUE Support and 21. 0 sec without UE support. 05/13/2021=15.0 sec with BUE support; 09/03/2021= 16 sec without UE support.    Time 12    Period Weeks    Status Achieved      PT LONG TERM GOAL #4   Title Pt will increase his self-selected 10MWT to >0.74 m/s in order to demonstrate improvement in gait speed and improved community ambulation    Baseline 09/20/20: self-selected: 14.4s = 0.69 m/s with rollator (Previous discharge 03/06/20: self-selected: 13.6s = 0.74 m/s), 12/14: 0.75 m/s, 1/13: 0.78 m/s. 01/02/2021= 0.85 m/s using 4WW, 01/16/21 with 4 WW .99 m/s. 02/06/2021= self selected = 0.93 m/s with 8VF    Time 4    Period Weeks    Status Achieved      PT LONG TERM GOAL #5   Title Patient will increase six minute walk test distance to >1000 for progression to community ambulator and improve gait ability    Baseline 01/10/20  620 ft with rollator, 01/21/21  965 ft; 02/06/2021= 910 feet with 4WW.  02/18/2021= 950 feet with 4WW. 05/06/2021= will test next visit. 05/13/2021- Deferred secondary to increased left knee pain. 06/11/21: 722ft with 0WC *knee pain throughout test. 07/11/2021= 900 feet with use of 4WW.  08/13/2021- Deferred testing secondary to patient not feeling well (recovering from recent bout of COVID). Will  test next 1-2 sessions. 09/03/2021= 750 feet with use of 4WW (recovering from COVID and strained left hamstring); 10/08/2021=890 feet with use of 4WW- VC not to drag left foot. Patient was distracted in gym stopping and talking a few times limiting his performance- requiring redirection. 11/05/2021-750 feet with 3JS- Increased dragging of left foot after 4 min mark- no complaint of pain only fatigue.    Time 12    Period Weeks    Status On-going    Target Date 01/28/22      Additional Long Term Goals   Additional Long Term Goals Yes      PT LONG TERM GOAL #6   Title Patient will demonstrate ability to walk with HHA +1 > 200 ft. without loss of balance on level surfaces to simulate walking his grandaughter down the aisle for her upcoming wedding in November 2022.    Baseline 05/13/2021- Patient currently ambulating short community distance with use of 4WW but desires to walk with his grandaughter with HHA for her wedding. 06/11/21: attempted 5 steps with HHA x1, deemed unsafe. 07/16/2021- Patient ambulated around 100 feet with HHA (arms interlocked) with PT to simulate walking daughter down aisle.  08/13/2021- Deferred testing secondary to patient not feeling well (recovering from recent bout of COVID). Will test next 1-2 sessions.  10/08/2021=Gait with HHA  on right and use of SPC on left- To simulate walking granddaughter down the aisle- approx 220 feet today total- with CGA and progressing with no report of pain. 11/04/2021- Patient was able to successfully walk his grandaughter down the aisle a couple of weeks ago.    Time 12    Period Weeks    Status Achieved    Target Date 11/05/21      PT LONG TERM GOAL #7   Title Pt will decrease 5TSTS  to 14 sec or less with BUE support on armrest in order to demonstrate clinically significant improvement in LE strength.    Baseline 05/13/2021=15.0 sec with BUE support; 26 sec without UE support. 06/11/21: 14.61 seconds with BUE support. 07/16/2021= 23 sec without UE  support. 08/13/2021= 24. 2 sec with BUE  09/03/2021= 20.97 sec with B UE support - Limited by left hamstring pain. 10/08/2021-19.0 sec with minimal BUE Support  upport. 11/05/2021= 16.31 sec with BUE support.    Time 12    Period Weeks    Status On-going    Target Date 01/28/22      PT LONG TERM GOAL #8   Title Patient will demonstrate modified independence with ambulation using SPC on level surfaces at > 300 feet for improved household and short community distances without loss of balance or excessive fatigue.    Baseline 11/05/2021= Patient able to ambulate approx 100 feet with SPC, close CGA- difficulty with gait sequencing and mild limp yet no report of increased left knee pain.                   Plan - 11/21/21 1500     Clinical Impression Statement Patient presented with good motivation today for session- able to perform all balance activities without report of increased left knee pain. Caregiver Candie Mile) present for today's session and able to  follow along for compliance with prescribed home program. Patient performed well - exhibiting some increased sway with eyes closed and more unsteadiness in narrowed position. Patient instructed to incorporate these exercises as part of his comprehensive Home program to be performed at least 2x/week. He and his caregiver verbalized understanding. He will benefit from further skilled PT to improve BLE strength, balance and functional mobility and overall improved quality of life    Personal Factors and Comorbidities Age;Comorbidity 3+    Comorbidities HTN, CVA    Examination-Activity Limitations Bend;Lift;Squat;Locomotion Level;Stairs;Stand    Examination-Participation Restrictions Community Activity;Driving;Laundry;Meal Prep;Shop    Stability/Clinical Decision Making Unstable/Unpredictable    Rehab Potential Fair    PT Frequency 2x / week    PT Duration 12 weeks    PT Treatment/Interventions ADLs/Self Care Home  Management;Cryotherapy;Electrical Stimulation;Iontophoresis 4mg /ml Dexamethasone;Moist Heat;Traction;Ultrasound;DME Instruction;Gait training;Stair training;Therapeutic activities;Therapeutic exercise;Functional mobility training;Balance training;Neuromuscular re-education;Patient/family education;Manual techniques;Dry needling;Vestibular;Joint Manipulations;Spinal Manipulations;Aquatic Therapy    PT Next Visit Plan Continue to focus on LE strengthening and balance, dynamic balance tasks-    PT Home Exercise Plan Access Code: [email or web address]    Consulted and Agree with Plan of Care Patient;Family member/caregiver    Family Member Consulted Son             Patient will benefit from skilled therapeutic intervention in order to improve the following deficits and impairments:  Abnormal gait, Decreased balance, Decreased mobility, Difficulty walking, Decreased knowledge of precautions, Decreased safety awareness, Decreased activity tolerance, Decreased strength, Decreased endurance  Visit Diagnosis: Abnormality of gait and mobility  Difficulty in walking, not elsewhere classified  Muscle weakness (generalized)  Unsteadiness on feet     Problem List Patient Active Problem List   Diagnosis Date Noted   Localized osteoarthritis of knees, bilateral 06/13/2021   Moderate protein-calorie malnutrition ([place]) 03/11/2021   Hemiparesis affecting left side as late effect of cerebrovascular accident (CVA) ([place]) 03/11/2021   Aneurysm, aorta, thoracic 12/21/2020   Calculus of bile duct without cholecystitis and without obstruction    Non-small cell carcinoma of lung, right ([place]) 09/07/2020   Mass of upper lobe of right lung 07/13/2020   Calculus of bile duct with cholecystitis without obstruction    Abnormal findings on imaging of biliary tract    Acute cholecystitis 05/02/2020   Chronic venous insufficiency 12/05/2019   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    CKD (chronic kidney disease),  stage II    New onset atrial fibrillation ([place])    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   Goals of care, counseling/discussion    Palliative care by specialist    DNR (do not resuscitate) discussion    History of CVA (cerebrovascular accident) 05/22/2019   Persistent proteinuria 07/30/2018   Mobitz type 2 second degree heart block 07/13/2018   Chronic kidney disease, stage III (moderate) ([place]) 06/15/2018   Leg pain 05/06/2018   Lymphedema 05/06/2018   Coagulopathy ([place]) 07/07/2017   Lumbar spondylosis 03/10/2017   Elevated uric acid in blood 11/20/2016   BPH (benign prostatic hyperplasia) 05/29/2016   Peripheral vascular disease of lower extremity ([place]) 03/03/2016   Gallstone 12/31/2015   Splenic infarct 12/31/2015   Splenic vein thrombosis 11/27/2015   Atherosclerosis of aorta ([place]) 11/22/2015   Carotid artery narrowing 11/22/2015   Diverticulosis of colon 11/22/2015   Decreased creatinine clearance 11/22/2015   Arthritis, degenerative 11/22/2015   Lactose intolerance 11/22/2015   Basal cell carcinoma 11/22/2015   Essential hypertension 06/21/2015   Hyperlipemia 06/21/2015   GERD (gastroesophageal  reflux disease) 06/21/2015   Calculus of kidney 11/18/2013    Lewis Moccasin, PT 11/21/2021, 3:09 PM  [place] MAIN [facility] SERVICES [address] [place], [place], [address] Phone: [phone]   Fax:  [phone]  Name: Jose Castro MRN: [id number] Date of Birth: 04-Apr-1926

## 2021-11-25 ENCOUNTER — Encounter (INDEPENDENT_AMBULATORY_CARE_PROVIDER_SITE_OTHER): Payer: Self-pay | Admitting: Vascular Surgery

## 2021-11-25 DIAGNOSIS — I70219 Atherosclerosis of native arteries of extremities with intermittent claudication, unspecified extremity: Secondary | ICD-10-CM | POA: Insufficient documentation

## 2021-11-26 ENCOUNTER — Other Ambulatory Visit: Payer: Self-pay | Admitting: Family Medicine

## 2021-11-26 ENCOUNTER — Ambulatory Visit: Payer: Medicare Other

## 2021-11-26 NOTE — Telephone Encounter (Signed)
Pt's daughter calling in checking on status of pt  pantoprazole (PROTONIX) informed pt daughter of refill sent on 12/01 with 3 refills, she said she'll call pharmacy back.

## 2021-11-28 ENCOUNTER — Ambulatory Visit: Payer: Medicare Other

## 2021-11-28 ENCOUNTER — Other Ambulatory Visit: Payer: Self-pay

## 2021-11-28 DIAGNOSIS — R262 Difficulty in walking, not elsewhere classified: Secondary | ICD-10-CM

## 2021-11-28 DIAGNOSIS — M6281 Muscle weakness (generalized): Secondary | ICD-10-CM

## 2021-11-28 DIAGNOSIS — R278 Other lack of coordination: Secondary | ICD-10-CM

## 2021-11-28 DIAGNOSIS — R269 Unspecified abnormalities of gait and mobility: Secondary | ICD-10-CM

## 2021-11-28 DIAGNOSIS — R2681 Unsteadiness on feet: Secondary | ICD-10-CM

## 2021-11-28 NOTE — Therapy (Signed)
[place] [facility] SERVICES [address] [place], [place], [address] Phone: [phone]   Fax:  [phone]  Physical Therapy Treatment/Physical Therapy Progress Note   Dates of reporting period  10/08/2021  to   11/28/2021  Patient Details  Name: Jose Castro MRN: [id number] Date of Birth: 1926/03/19 No data recorded  Encounter Date: 11/28/2021   PT End of Session - 11/28/21 1434     Visit Number 80    Number of Visits 58    Date for PT Re-Evaluation 11/05/21    Authorization Type UHC Medicare    Authorization Time Period 08/13/2021- 11/05/2021; PN on 10/08/2021    Progress Note Due on Visit 70    PT Start Time 1343    PT Stop Time 1424    PT Time Calculation (min) 41 min    Equipment Utilized During Treatment Gait belt    Activity Tolerance Patient tolerated treatment well;No increased pain    Behavior During Therapy WFL for tasks assessed/performed             Past Medical History:  Diagnosis Date   Allergy    Penicillin   Arthritis    Basal cell carcinoma    BPH (benign prostatic hyperplasia)    Chronic kidney disease    had a kidney stone which per family was a cyst that was removed   Dysrhythmia    GERD (gastroesophageal reflux disease)    History of kidney stones    Hyperlipidemia    Hypertension    Left-sided low back pain with left-sided sciatica    Lung mass    Splenic vein thrombosis    Stroke ([place]) 05/22/2019   Thrombosis 12/2015   mural  area and no notation of heart attack    Past Surgical History:  Procedure Laterality Date   APPENDECTOMY     CATARACT EXTRACTION, BILATERAL     CYSTOSCOPY WITH INSERTION OF UROLIFT     ENDOSCOPIC RETROGRADE CHOLANGIOPANCREATOGRAPHY (ERCP) WITH PROPOFOL N/A 05/11/2020   Procedure: ENDOSCOPIC RETROGRADE CHOLANGIOPANCREATOGRAPHY (ERCP) WITH PROPOFOL;  Surgeon: [name], MD;  Location: [facility] ENDOSCOPY;  Service: Endoscopy;  Laterality: N/A;   ERCP N/A 10/23/2020    Procedure: ENDOSCOPIC RETROGRADE CHOLANGIOPANCREATOGRAPHY (ERCP);  Surgeon: [name], MD;  Location: [facility] ENDOSCOPY;  Service: Endoscopy;  Laterality: N/A;   EYE SURGERY  08/2018   IR EXCHANGE BILIARY DRAIN  05/04/2020   KIDNEY STONE SURGERY     KNEE SURGERY     PACEMAKER INSERTION N/A 07/13/2018   Procedure: INSERTION PACEMAKER-DUEL CHAMBER INITIAL IMPLANT;  Surgeon: Isaias Cowman, MD;  Location: [facility] ORS;  Service: Cardiovascular;  Laterality: N/A;   SPINE SURGERY      There were no vitals filed for this visit.   Subjective Assessment - 11/28/21 1432     Subjective Patient states feeling about the same as far as knee pain. He and his caregiver report compliance with HEP to date. He reports having a good Christmas with no issues.    Pertinent History Pt referred for deconditioning and gait instability. He has had a decline since he last finished therapy. Pt had a hospital admission from 05/02/20 to 05/14/20 for acute cholecystitis and is now s/p cholecystostomy drain placement. Drain is still in place and pt is waiting to hear when they plan to remove it. Pt discharged to SNF and then returned home. In addition pt underwent radiation therapy for a RUL lung mass/cancer and had his last radiation treatment in September of 2021.  He reports that he has continued to walk at home as his primary exercise. Pt was previously seen at this clinic for PT/OT due to weakness/imbalance s/p CVA. Prior history from 09/21/19: Pt is a 87 year old male who presents with imbalance and difficulty with gait following a right frontal CVA with left hemiparesis on 05/22/19.  He has completed inpatient and home health PT, and now presents for OP PT.  He was discharged from inpatient rehab at a supervision level of assistance, ambulating with a RW.  His family notes that he is impulsive and will try to ambulate without his RW at home.  Family is currently providing 24/7 supervision.    Currently in Pain? Yes    Pain Score  4     Pain Location Knee    Pain Orientation Left;Anterior    Pain Descriptors / Indicators Aching;Sore    Pain Type Chronic pain    Pain Onset More than a month ago    Pain Frequency Constant             *Will reassess all mobility goals next visit- Treatment today focused on postural strengthening for approaching transition to discharge to home program. Will reasses all mobility goals during next visit to focus on functional endurance.   INTERVENTIONS:   Therapeutic Exercises:   Seated scap row- GTB- VC for correct technique- 2 sets of 12 reps. Patient reports as "Hard"  Seated B UE Shoulder Ext with GTB - 2 sets of 12 reps (VC to keep elbows straight and pull arms back toward side)   Seated horizontal shoulder abd  RTB 2 sets of 12 reps (VC to keep shoulders elevated at 90 deg and keep elbows straight)   Seated Posterior shoulder rolls 2 sets of 12reps (VC and min physical assist to perform correctly today)   Seated Shoulder Ext Rotation- RTB 2 sets of 12 reps (VC to remind patient to keep elbows tucked in by side)   Education provided throughout session via VC/TC and demonstration to facilitate movement at target joints and correct muscle activation for all testing and exercises performed. He required brief rest break between each set due to fatigue.   Access Code: [email or web address] URL: [email or web address] Date: 11/28/2021 Prepared by: Sande Brothers  Exercises Seated Shoulder Row with Anchored Resistance - 1 x daily - 7 x weekly - 3 sets - 10 reps Seated Shoulder Extension and Scapular Retraction with Resistance - 1 x daily - 7 x weekly - 3 sets - 10 reps - 2 hold Seated Shoulder Horizontal Abduction with Resistance - 1 x daily - 7 x weekly - 3 sets - 10 reps Seated Shoulder Rolls - 1 x daily - 7 x weekly - 3 sets - 10 reps Shoulder External Rotation and Scapular Retraction with Resistance - 1 x daily - 7 x weekly - 3 sets - 10 reps Standing with Back Flat  Against Wall - 1 x daily - 7 x weekly - 3 sets - 10 reps                      PT Education - 11/28/21 1433     Education Details Posture exercises for HEP to promote improved overall posture.    Person(s) Educated Patient    Methods Explanation;Demonstration;Tactile cues;Verbal cues;Handout    Comprehension Verbalized understanding;Returned demonstration;Verbal cues required;Tactile cues required;Need further instruction              PT Short Term Goals - 01/21/21 1438  PT SHORT TERM GOAL #1   Title Pt will be independent with HEP in order to improve strength and balance in order to decrease fall risk and improve function at home.    Baseline 01/02/2021- Patient verbalize understanding of walking and seated exercises for home program. 01/21/21 patient has been more active and understands HEP.    Time 6    Period Weeks    Status Achieved    Target Date 01/10/21               PT Long Term Goals - 11/28/21 1436       PT LONG TERM GOAL #1   Title Pt will improve BERG by at least 3 points in order to demonstrate clinically significant improvement in balance.    Baseline 09/20/20: 32/56 (Previous discharge: 03/06/20: 46/56), 12/14: 32/56, 1/13: 31/56, 01/16/21 35/56, 02/06/2021=36/56. 02/18/2021= 37/56.03/20/2021- Deferred secondary to left knee aching- will attempt next visit. 05/06/2021= 39/56; 05/13/2021= 36/56 *patient having more left knee pain. 07/16/2021= 40/56    Time 12    Period Weeks    Status Achieved      PT LONG TERM GOAL #2   Title Pt will decrease TUG to below 14 seconds/decrease in order to demonstrate decreased fall risk.    Baseline 09/20/20: 19.2s, 12/14: 17.48, 1/13: 16.65 sec. 01/02/2021=15.75 sec using 4WW, 01/21/21  14.37 seconds. 02/06/2021= 14.92 sec using 4WW. 02/18/2021= 14.5 sec using 4WW. 03/20/2021- Deferred secondary to left knee aching- will attempt next visit. 05/06/2021= 16.32 sec avg using 4WW. 05/13/2021= 16.5 sec with 4WW *increased left  knee pain. 06/11/21: 20.11 seconds (increased time to turn) using 4WW;  08/13/2021- Deferred testing secondary to patient not feeling well (recovering from recent bout of COVID). Will test next 1-2 sessions. 09/03/2021= 16.83 sec using 4WW 11/04/2021= 19.22 sec; 11/28/2021- Did not test today due to focus on postural strengthening- Will reassess next visit.    Time 12    Period Weeks    Status On-going    Target Date 01/28/22      PT LONG TERM GOAL #3   Title Pt will decrease 5TSTS by at least 3 seconds in order to demonstrate clinically significant improvement in LE strength.    Baseline 09/20/20: 23.1s with minA+1 for strength and balance (Previous discharge 03/06/20: 16.1s), 12/14: 20 sec, 1/13: 22 sec.   01/02/2021= 16.05 sec with light UE support. 01/21/21 01/21/21 37.90 seconds without UE, with UE support 12.93 seconds: 02/06/2021= with 1 UE 17.93 sec. 02/18/2021= 21.33 sec without UE support. 05/06/2021= 15.3 sec with BUE Support and 21. 0 sec without UE support. 05/13/2021=15.0 sec with BUE support; 09/03/2021= 16 sec without UE support.    Time 12    Period Weeks    Status Achieved      PT LONG TERM GOAL #4   Title Pt will increase his self-selected 10MWT to >0.74 m/s in order to demonstrate improvement in gait speed and improved community ambulation    Baseline 09/20/20: self-selected: 14.4s = 0.69 m/s with rollator (Previous discharge 03/06/20: self-selected: 13.6s = 0.74 m/s), 12/14: 0.75 m/s, 1/13: 0.78 m/s. 01/02/2021= 0.85 m/s using 4WW, 01/16/21 with 4 WW .99 m/s. 02/06/2021= self selected = 0.93 m/s with 0HK    Time 4    Period Weeks    Status Achieved      PT LONG TERM GOAL #5   Title Patient will increase six minute walk test distance to >1000 for progression to community ambulator and improve gait ability    Baseline  01/10/20  620 ft with rollator, 01/21/21  965 ft; 02/06/2021= 910 feet with 4WW.  02/18/2021= 950 feet with 4WW. 05/06/2021= will test next visit. 05/13/2021- Deferred secondary to  increased left knee pain. 06/11/21: 739ft with 0NO *knee pain throughout test. 07/11/2021= 900 feet with use of 4WW.  08/13/2021- Deferred testing secondary to patient not feeling well (recovering from recent bout of COVID). Will test next 1-2 sessions. 09/03/2021= 750 feet with use of 4WW (recovering from COVID and strained left hamstring); 10/08/2021=890 feet with use of 4WW- VC not to drag left foot. Patient was distracted in gym stopping and talking a few times limiting his performance- requiring redirection. 11/05/2021-750 feet with 7SJ- Increased dragging of left foot after 4 min mark- no complaint of pain only fatigue. 11/28/2021- Will reassess next visit    Time 12    Period Weeks    Status On-going    Target Date 01/28/22      PT LONG TERM GOAL #6   Title Patient will demonstrate ability to walk with HHA +1 > 200 ft. without loss of balance on level surfaces to simulate walking his grandaughter down the aisle for her upcoming wedding in November 2022.    Baseline 05/13/2021- Patient currently ambulating short community distance with use of 4WW but desires to walk with his grandaughter with HHA for her wedding. 06/11/21: attempted 5 steps with HHA x1, deemed unsafe. 07/16/2021- Patient ambulated around 100 feet with HHA (arms interlocked) with PT to simulate walking daughter down aisle.  08/13/2021- Deferred testing secondary to patient not feeling well (recovering from recent bout of COVID). Will test next 1-2 sessions.  10/08/2021=Gait with HHA  on right and use of SPC on left- To simulate walking granddaughter down the aisle- approx 220 feet today total- with CGA and progressing with no report of pain. 11/04/2021- Patient was able to successfully walk his grandaughter down the aisle a couple of weeks ago.    Time 12    Period Weeks    Status Achieved    Target Date 11/05/21      PT LONG TERM GOAL #7   Title Pt will decrease 5TSTS  to 14 sec or less with BUE support on armrest in order to demonstrate  clinically significant improvement in LE strength.    Baseline 05/13/2021=15.0 sec with BUE support; 26 sec without UE support. 06/11/21: 14.61 seconds with BUE support. 07/16/2021= 23 sec without UE support. 08/13/2021= 24. 2 sec with BUE  09/03/2021= 20.97 sec with B UE support - Limited by left hamstring pain. 10/08/2021-19.0 sec with minimal BUE Support  upport. 11/05/2021= 16.31 sec with BUE support. 11/28/2021- Will reassess next visit    Time 12    Period Weeks    Status On-going    Target Date 01/28/22      PT LONG TERM GOAL #8   Title Patient will demonstrate modified independence with ambulation using SPC on level surfaces at > 300 feet for improved household and short community distances without loss of balance or excessive fatigue.    Baseline 11/05/2021= Patient able to ambulate approx 100 feet with SPC, close CGA- difficulty with gait sequencing and mild limp yet no report of increased left knee pain. 11/28/2021= Will reassess next visit    Time 12    Period Weeks    Status New    Target Date 01/28/22                   Plan - 11/28/21 1434  Clinical Impression Statement Patient continues to focus on learning new exercises to transition to home exercises with assist of caregiver and today's focus was on postural strengthening. He was fairly motivated today- Requiring increased overall VC to perform all exercises correctly but able to return demonstration with assist. Was able to add all postural exercises to HEP to compliment already instructed LE strengthening and balance. Will plan to reassess all mobility goals as focus shifts toward endurance next visit. Patient's condition has the potential to improve in response to therapy. Maximum improvement is yet to be obtained. The anticipated improvement is attainable and reasonable in a generally predictable time.  ?He will benefit from further skilled PT to improve BLE strength, balance and functional mobility for ability to  participate in future granddaughter wedding and overall improved quality of life.    Personal Factors and Comorbidities Age;Comorbidity 3+    Comorbidities HTN, CVA    Examination-Activity Limitations Bend;Lift;Squat;Locomotion Level;Stairs;Stand    Examination-Participation Restrictions Community Activity;Driving;Laundry;Meal Prep;Shop    Stability/Clinical Decision Making Unstable/Unpredictable    Rehab Potential Fair    PT Frequency 2x / week    PT Duration 12 weeks    PT Treatment/Interventions ADLs/Self Care Home Management;Cryotherapy;Electrical Stimulation;Iontophoresis 4mg /ml Dexamethasone;Moist Heat;Traction;Ultrasound;DME Instruction;Gait training;Stair training;Therapeutic activities;Therapeutic exercise;Functional mobility training;Balance training;Neuromuscular re-education;Patient/family education;Manual techniques;Dry needling;Vestibular;Joint Manipulations;Spinal Manipulations;Aquatic Therapy    PT Next Visit Plan Continue to focus on LE strengthening and balance, dynamic balance tasks-    PT Home Exercise Plan Access Code: [email or web address]; 11/28/2021= postural HEP-Access Code: [email or web address]    Consulted and Agree with Plan of Care Patient;Family member/caregiver    Family Member Consulted Caregiver- Music therapist             Patient will benefit from skilled therapeutic intervention in order to improve the following deficits and impairments:  Abnormal gait, Decreased balance, Decreased mobility, Difficulty walking, Decreased knowledge of precautions, Decreased safety awareness, Decreased activity tolerance, Decreased strength, Decreased endurance  Visit Diagnosis: Abnormality of gait and mobility  Difficulty in walking, not elsewhere classified  Muscle weakness (generalized)  Other lack of coordination  Unsteadiness on feet     Problem List Patient Active Problem List   Diagnosis Date Noted   Atherosclerosis of native arteries of extremity with intermittent claudication ([place])  11/25/2021   Localized osteoarthritis of knees, bilateral 06/13/2021   Moderate protein-calorie malnutrition ([place]) 03/11/2021   Hemiparesis affecting left side as late effect of cerebrovascular accident (CVA) ([place]) 03/11/2021   Aneurysm, aorta, thoracic 12/21/2020   Calculus of bile duct without cholecystitis and without obstruction    Non-small cell carcinoma of lung, right ([place]) 09/07/2020   Mass of upper lobe of right lung 07/13/2020   Calculus of bile duct with cholecystitis without obstruction    Abnormal findings on imaging of biliary tract    Acute cholecystitis 05/02/2020   Chronic venous insufficiency 12/05/2019   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    CKD (chronic kidney disease), stage II    New onset atrial fibrillation ([place])    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   Goals of care, counseling/discussion    Palliative care by specialist    DNR (do not resuscitate) discussion    History of CVA (cerebrovascular accident) 05/22/2019   Persistent proteinuria 07/30/2018   Mobitz type 2 second degree heart block 07/13/2018   Chronic kidney disease, stage III (moderate) ([place]) 06/15/2018   Leg pain 05/06/2018   Lymphedema 05/06/2018   Coagulopathy ([place]) 07/07/2017   Lumbar spondylosis 03/10/2017  Elevated uric acid in blood 11/20/2016   BPH (benign prostatic hyperplasia) 05/29/2016   Peripheral vascular disease of lower extremity ([place]) 03/03/2016   Gallstone 12/31/2015   Splenic infarct 12/31/2015   Splenic vein thrombosis 11/27/2015   Atherosclerosis of aorta ([place]) 11/22/2015   Carotid artery narrowing 11/22/2015   Diverticulosis of colon 11/22/2015   Decreased creatinine clearance 11/22/2015   Arthritis, degenerative 11/22/2015   Lactose intolerance 11/22/2015   Basal cell carcinoma 11/22/2015   Essential hypertension 06/21/2015   Hyperlipemia 06/21/2015   GERD (gastroesophageal reflux disease) 06/21/2015   Calculus of kidney 11/18/2013     Lewis Moccasin, PT 11/28/2021, 2:54 PM  [place] [facility] SERVICES [address] [place], [place], [address] Phone: [phone]   Fax:  [phone]  Name: Jose Castro MRN: [id number] Date of Birth: [birth year]

## 2021-12-03 ENCOUNTER — Ambulatory Visit: Payer: Medicare Other

## 2021-12-05 ENCOUNTER — Other Ambulatory Visit: Payer: Self-pay

## 2021-12-05 ENCOUNTER — Ambulatory Visit: Payer: Medicare Other | Attending: Family Medicine

## 2021-12-05 DIAGNOSIS — R262 Difficulty in walking, not elsewhere classified: Secondary | ICD-10-CM | POA: Insufficient documentation

## 2021-12-05 DIAGNOSIS — R2681 Unsteadiness on feet: Secondary | ICD-10-CM | POA: Diagnosis present

## 2021-12-05 DIAGNOSIS — M6281 Muscle weakness (generalized): Secondary | ICD-10-CM | POA: Diagnosis present

## 2021-12-05 DIAGNOSIS — R269 Unspecified abnormalities of gait and mobility: Secondary | ICD-10-CM | POA: Diagnosis present

## 2021-12-05 NOTE — Therapy (Signed)
[place] MAIN [facility] SERVICES [address] [place], [place], [address] Phone: [phone]   Fax:  [phone]  Physical Therapy Treatment  Patient Details  Name: Jose Castro MRN: [id number] Date of Birth: 04-29-1926 No data recorded  Encounter Date: 12/05/2021   PT End of Session - 12/05/21 1408     Visit Number 81    Number of Visits 56    Date for PT Re-Evaluation 01/28/22    Authorization Type [employer] Medicare    Authorization Time Period 08/13/2021- 11/05/2021; PN on 10/08/2021  12/6- 01/28/2022    Progress Note Due on Visit 70    PT Start Time 1348    PT Stop Time 1422    PT Time Calculation (min) 34 min    Equipment Utilized During Treatment Gait belt    Activity Tolerance Patient tolerated treatment well;No increased pain    Behavior During Therapy WFL for tasks assessed/performed             Past Medical History:  Diagnosis Date   Allergy    Penicillin   Arthritis    Basal cell carcinoma    BPH (benign prostatic hyperplasia)    Chronic kidney disease    had a kidney stone which per family was a cyst that was removed   Dysrhythmia    GERD (gastroesophageal reflux disease)    History of kidney stones    Hyperlipidemia    Hypertension    Left-sided low back pain with left-sided sciatica    Lung mass    Splenic vein thrombosis    Stroke ([place]) 05/22/2019   Thrombosis 12/2015   mural  area and no notation of heart attack    Past Surgical History:  Procedure Laterality Date   APPENDECTOMY     CATARACT EXTRACTION, BILATERAL     CYSTOSCOPY WITH INSERTION OF UROLIFT     ENDOSCOPIC RETROGRADE CHOLANGIOPANCREATOGRAPHY (ERCP) WITH PROPOFOL N/A 05/11/2020   Procedure: ENDOSCOPIC RETROGRADE CHOLANGIOPANCREATOGRAPHY (ERCP) WITH PROPOFOL;  Surgeon: [name], MD;  Location: [facility] ENDOSCOPY;  Service: Endoscopy;  Laterality: N/A;   ERCP N/A 10/23/2020   Procedure: ENDOSCOPIC RETROGRADE CHOLANGIOPANCREATOGRAPHY (ERCP);  Surgeon:  [name], MD;  Location: [facility] ENDOSCOPY;  Service: Endoscopy;  Laterality: N/A;   EYE SURGERY  08/2018   IR EXCHANGE BILIARY DRAIN  05/04/2020   KIDNEY STONE SURGERY     KNEE SURGERY     PACEMAKER INSERTION N/A 07/13/2018   Procedure: INSERTION PACEMAKER-DUEL CHAMBER INITIAL IMPLANT;  Surgeon: Isaias Cowman, MD;  Location: [facility] ORS;  Service: Cardiovascular;  Laterality: N/A;   SPINE SURGERY      There were no vitals filed for this visit.   Subjective Assessment - 12/05/21 1356     Subjective Patient reports feeling about the same. Reports compliance with his exercises. Caregiver reports he received an exercise bike (pedal) at Christmas yet has not used.    Pertinent History Pt referred for deconditioning and gait instability. He has had a decline since he last finished therapy. Pt had a hospital admission from 05/02/20 to 05/14/20 for acute cholecystitis and is now s/p cholecystostomy drain placement. Drain is still in place and pt is waiting to hear when they plan to remove it. Pt discharged to SNF and then returned home. In addition pt underwent radiation therapy for a RUL lung mass/cancer and had his last radiation treatment in September of 2021. He reports that he has continued to walk at home as his primary exercise. Pt was previously seen  at this clinic for PT/OT due to weakness/imbalance s/p CVA. Prior history from 09/21/19: Pt is a 88 year old male who presents with imbalance and difficulty with gait following a right frontal CVA with left hemiparesis on 05/22/19.  He has completed inpatient and home health PT, and now presents for OP PT.  He was discharged from inpatient rehab at a supervision level of assistance, ambulating with a RW.  His family notes that he is impulsive and will try to ambulate without his RW at home.  Family is currently providing 24/7 supervision.    Currently in Pain? Yes    Pain Score 4     Pain Location Knee    Pain Orientation Left;Anterior    Pain  Descriptors / Indicators Aching;Sore    Pain Type Chronic pain    Pain Onset More than a month ago    Pain Frequency Constant                 INTERVENTIONS:   Instructed in use of pedal bike for home use as caregiver reported that the patient received one for Christmas.   Review of balance exercises for home   Standing in corner:   -feet wide with eyes open- hold 20 sec x 3 -feet wide with eyes closed - hold 20 sec x 3 -Feet narrowed with eyes open - hold 20 sec x 3 -feet narrowed with eyes closed- hold 20 sec x 3 -feet staggered with eyes open - single leg stance =attempting to hold 3-5 sec  x multiple attempts.  Side stepping alongside horizontal bar - x 6 feet x 5 trips to left and back to right- VC to increase Step width as able.   Forward and backward gait - walking 6 feet forward with 1 UE support on horizontal bar then back x 5 trials- VC to increase step length as able.  Education provided throughout session via VC/TC and demonstration to facilitate movement at target joints and correct muscle activation for all testing and exercises performed.   All above exercises with reviewed along with patient [name] to perform for HEP.                         PT Education - 12/05/21 1715     Education Details Exercise technique    Person(s) Educated Patient;Caregiver(s)    Methods Explanation;Demonstration;Tactile cues;Verbal cues    Comprehension Verbalized understanding;Returned demonstration;Tactile cues required;Need further instruction;Verbal cues required              PT Short Term Goals - 01/21/21 1438       PT SHORT TERM GOAL #1   Title Pt will be independent with HEP in order to improve strength and balance in order to decrease fall risk and improve function at home.    Baseline 01/02/2021- Patient verbalize understanding of walking and seated exercises for home program. 01/21/21 patient has been more active and understands  HEP.    Time 6    Period Weeks    Status Achieved    Target Date 01/10/21               PT Long Term Goals - 11/28/21 1436       PT LONG TERM GOAL #1   Title Pt will improve BERG by at least 3 points in order to demonstrate clinically significant improvement in balance.    Baseline 09/20/20: 32/56 (Previous discharge: 03/06/20: 46/56), 12/14: 32/56, 1/13: 31/56, 01/16/21 35/56, 02/06/2021=36/56.  02/18/2021= 37/56.03/20/2021- Deferred secondary to left knee aching- will attempt next visit. 05/06/2021= 39/56; 05/13/2021= 36/56 *patient having more left knee pain. 07/16/2021= 40/56    Time 12    Period Weeks    Status Achieved      PT LONG TERM GOAL #2   Title Pt will decrease TUG to below 14 seconds/decrease in order to demonstrate decreased fall risk.    Baseline 09/20/20: 19.2s, 12/14: 17.48, 1/13: 16.65 sec. 01/02/2021=15.75 sec using 4WW, 01/21/21  14.37 seconds. 02/06/2021= 14.92 sec using 4WW. 02/18/2021= 14.5 sec using 4WW. 03/20/2021- Deferred secondary to left knee aching- will attempt next visit. 05/06/2021= 16.32 sec avg using 4WW. 05/13/2021= 16.5 sec with 4WW *increased left knee pain. 06/11/21: 20.11 seconds (increased time to turn) using 4WW;  08/13/2021- Deferred testing secondary to patient not feeling well (recovering from recent bout of COVID). Will test next 1-2 sessions. 09/03/2021= 16.83 sec using 4WW 11/04/2021= 19.22 sec; 11/28/2021- Did not test today due to focus on postural strengthening- Will reassess next visit.    Time 12    Period Weeks    Status On-going    Target Date 01/28/22      PT LONG TERM GOAL #3   Title Pt will decrease 5TSTS by at least 3 seconds in order to demonstrate clinically significant improvement in LE strength.    Baseline 09/20/20: 23.1s with minA+1 for strength and balance (Previous discharge 03/06/20: 16.1s), 12/14: 20 sec, 1/13: 22 sec.   01/02/2021= 16.05 sec with light UE support. 01/21/21 01/21/21 37.90 seconds without UE, with UE support 12.93 seconds:  02/06/2021= with 1 UE 17.93 sec. 02/18/2021= 21.33 sec without UE support. 05/06/2021= 15.3 sec with BUE Support and 21. 0 sec without UE support. 05/13/2021=15.0 sec with BUE support; 09/03/2021= 16 sec without UE support.    Time 12    Period Weeks    Status Achieved      PT LONG TERM GOAL #4   Title Pt will increase his self-selected 10MWT to >0.74 m/s in order to demonstrate improvement in gait speed and improved community ambulation    Baseline 09/20/20: self-selected: 14.4s = 0.69 m/s with rollator (Previous discharge 03/06/20: self-selected: 13.6s = 0.74 m/s), 12/14: 0.75 m/s, 1/13: 0.78 m/s. 01/02/2021= 0.85 m/s using 4WW, 01/16/21 with 4 WW .99 m/s. 02/06/2021= self selected = 0.93 m/s with 6LY    Time 4    Period Weeks    Status Achieved      PT LONG TERM GOAL #5   Title Patient will increase six minute walk test distance to >1000 for progression to community ambulator and improve gait ability    Baseline 01/10/20  620 ft with rollator, 01/21/21  965 ft; 02/06/2021= 910 feet with 4WW.  02/18/2021= 950 feet with 4WW. 05/06/2021= will test next visit. 05/13/2021- Deferred secondary to increased left knee pain. 06/11/21: 79ft with 6TK *knee pain throughout test. 07/11/2021= 900 feet with use of 4WW.  08/13/2021- Deferred testing secondary to patient not feeling well (recovering from recent bout of COVID). Will test next 1-2 sessions. 09/03/2021= 750 feet with use of 4WW (recovering from COVID and strained left hamstring); 10/08/2021=890 feet with use of 4WW- VC not to drag left foot. Patient was distracted in gym stopping and talking a few times limiting his performance- requiring redirection. 11/05/2021-750 feet with 3TW- Increased dragging of left foot after 4 min mark- no complaint of pain only fatigue. 11/28/2021- Will reassess next visit    Time 12    Period Weeks  Status On-going    Target Date 01/28/22      PT LONG TERM GOAL #6   Title Patient will demonstrate ability to walk with HHA +1 > 200 ft. without  loss of balance on level surfaces to simulate walking his grandaughter down the aisle for her upcoming wedding in November 2022.    Baseline 05/13/2021- Patient currently ambulating short community distance with use of 4WW but desires to walk with his grandaughter with HHA for her wedding. 06/11/21: attempted 5 steps with HHA x1, deemed unsafe. 07/16/2021- Patient ambulated around 100 feet with HHA (arms interlocked) with PT to simulate walking daughter down aisle.  08/13/2021- Deferred testing secondary to patient not feeling well (recovering from recent bout of COVID). Will test next 1-2 sessions.  10/08/2021=Gait with HHA  on right and use of SPC on left- To simulate walking granddaughter down the aisle- approx 220 feet today total- with CGA and progressing with no report of pain. 11/04/2021- Patient was able to successfully walk his grandaughter down the aisle a couple of weeks ago.    Time 12    Period Weeks    Status Achieved    Target Date 11/05/21      PT LONG TERM GOAL #7   Title Pt will decrease 5TSTS  to 14 sec or less with BUE support on armrest in order to demonstrate clinically significant improvement in LE strength.    Baseline 05/13/2021=15.0 sec with BUE support; 26 sec without UE support. 06/11/21: 14.61 seconds with BUE support. 07/16/2021= 23 sec without UE support. 08/13/2021= 24. 2 sec with BUE  09/03/2021= 20.97 sec with B UE support - Limited by left hamstring pain. 10/08/2021-19.0 sec with minimal BUE Support  upport. 11/05/2021= 16.31 sec with BUE support. 11/28/2021- Will reassess next visit    Time 12    Period Weeks    Status On-going    Target Date 01/28/22      PT LONG TERM GOAL #8   Title Patient will demonstrate modified independence with ambulation using SPC on level surfaces at > 300 feet for improved household and short community distances without loss of balance or excessive fatigue.    Baseline 11/05/2021= Patient able to ambulate approx 100 feet with SPC, close CGA-  difficulty with gait sequencing and mild limp yet no report of increased left knee pain. 11/28/2021= Will reassess next visit    Time 12    Period Weeks    Status New    Target Date 01/28/22                   Plan - 12/05/21 1407     Clinical Impression Statement Patient presents with good motivation today. He was responsive to [facility] for best technique with safety with balance and all exercises were reviewed with patient and caregiver to add to HEP. He and she verbalized good understanding of home program. He has a complete HEP that has been reviewed over past few weeks and now plan will be for patient to practice HEP over next 2 weeks then return 1 more time on 12/26/2021 for final review- with plan in place to discharge if patient doing well.    Personal Factors and Comorbidities Age;Comorbidity 3+    Comorbidities HTN, CVA    Examination-Activity Limitations Bend;Lift;Squat;Locomotion Level;Stairs;Stand    Examination-Participation Restrictions Community Activity;Driving;Laundry;Meal Prep;Shop    Stability/Clinical Decision Making Unstable/Unpredictable    Rehab Potential Fair    PT Frequency 2x / week    PT Duration  12 weeks    PT Treatment/Interventions ADLs/Self Care Home Management;Cryotherapy;Electrical Stimulation;Iontophoresis 4mg /ml Dexamethasone;Moist Heat;Traction;Ultrasound;DME Instruction;Gait training;Stair training;Therapeutic activities;Therapeutic exercise;Functional mobility training;Balance training;Neuromuscular re-education;Patient/family education;Manual techniques;Dry needling;Vestibular;Joint Manipulations;Spinal Manipulations;Aquatic Therapy    PT Next Visit Plan Patient to practice on his comprehensive HEP for next week then return for possible final assessment    PT Home Exercise Plan Access Code: [email or web address]; 11/28/2021= postural HEP-Access Code: [email or web address]    Consulted and Agree with Plan of Care Patient;Family member/caregiver    Family Member Consulted  Caregiver- Music therapist             Patient will benefit from skilled therapeutic intervention in order to improve the following deficits and impairments:  Abnormal gait, Decreased balance, Decreased mobility, Difficulty walking, Decreased knowledge of precautions, Decreased safety awareness, Decreased activity tolerance, Decreased strength, Decreased endurance  Visit Diagnosis: Abnormality of gait and mobility  Difficulty in walking, not elsewhere classified  Muscle weakness (generalized)  Unsteadiness on feet     Problem List Patient Active Problem List   Diagnosis Date Noted   Atherosclerosis of native arteries of extremity with intermittent claudication ([place]) 11/25/2021   Localized osteoarthritis of knees, bilateral 06/13/2021   Moderate protein-calorie malnutrition ([place]) 03/11/2021   Hemiparesis affecting left side as late effect of cerebrovascular accident (CVA) ([place]) 03/11/2021   Aneurysm, aorta, thoracic 12/21/2020   Calculus of bile duct without cholecystitis and without obstruction    Non-small cell carcinoma of lung, right ([place]) 09/07/2020   Mass of upper lobe of right lung 07/13/2020   Calculus of bile duct with cholecystitis without obstruction    Abnormal findings on imaging of biliary tract    Acute cholecystitis 05/02/2020   Chronic venous insufficiency 12/05/2019   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    CKD (chronic kidney disease), stage II    New onset atrial fibrillation ([place])    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   Goals of care, counseling/discussion    Palliative care by specialist    DNR (do not resuscitate) discussion    History of CVA (cerebrovascular accident) 05/22/2019   Persistent proteinuria 07/30/2018   Mobitz type 2 second degree heart block 07/13/2018   Chronic kidney disease, stage III (moderate) ([place]) 06/15/2018   Leg pain 05/06/2018   Lymphedema 05/06/2018   Coagulopathy ([place]) 07/07/2017   Lumbar spondylosis  03/10/2017   Elevated uric acid in blood 11/20/2016   BPH (benign prostatic hyperplasia) 05/29/2016   Peripheral vascular disease of lower extremity ([place]) 03/03/2016   Gallstone 12/31/2015   Splenic infarct 12/31/2015   Splenic vein thrombosis 11/27/2015   Atherosclerosis of aorta ([place]) 11/22/2015   Carotid artery narrowing 11/22/2015   Diverticulosis of colon 11/22/2015   Decreased creatinine clearance 11/22/2015   Arthritis, degenerative 11/22/2015   Lactose intolerance 11/22/2015   Basal cell carcinoma 11/22/2015   Essential hypertension 06/21/2015   Hyperlipemia 06/21/2015   GERD (gastroesophageal reflux disease) 06/21/2015   Calculus of kidney 11/18/2013    Lewis Moccasin, PT 12/06/2021, 8:48 AM  [place] MAIN [facility] SERVICES [address] [place], [place], [address] Phone: [phone]   Fax:  [phone]  Name: Jose Castro MRN: [id number] Date of Birth: 04-19-1926

## 2021-12-10 ENCOUNTER — Ambulatory Visit: Payer: Medicare Other

## 2021-12-12 ENCOUNTER — Ambulatory Visit: Payer: Medicare Other

## 2021-12-17 ENCOUNTER — Ambulatory Visit: Payer: Medicare Other

## 2021-12-19 ENCOUNTER — Ambulatory Visit: Payer: Medicare Other

## 2021-12-24 ENCOUNTER — Ambulatory Visit: Payer: Medicare Other

## 2021-12-26 ENCOUNTER — Other Ambulatory Visit: Payer: Self-pay

## 2021-12-26 ENCOUNTER — Ambulatory Visit: Payer: Medicare Other

## 2021-12-26 DIAGNOSIS — R269 Unspecified abnormalities of gait and mobility: Secondary | ICD-10-CM

## 2021-12-26 DIAGNOSIS — R262 Difficulty in walking, not elsewhere classified: Secondary | ICD-10-CM

## 2021-12-26 DIAGNOSIS — R2681 Unsteadiness on feet: Secondary | ICD-10-CM

## 2021-12-26 DIAGNOSIS — M6281 Muscle weakness (generalized): Secondary | ICD-10-CM

## 2021-12-26 NOTE — Therapy (Signed)
[place] MAIN [facility] SERVICES [address] [place], [place], [address] Phone: [phone]   Fax:  [phone]  Physical Therapy Treatment/Discharge Summary  Patient Details  Name: Jose Castro MRN: [id number] Date of Birth: 03-04-1926 No data recorded  Encounter Date: 12/26/2021   PT End of Session - 12/26/21 1433     Visit Number 82    Number of Visits 64    Date for PT Re-Evaluation 01/28/22    Authorization Type [employer] Medicare    Authorization Time Period 08/13/2021- 11/05/2021; PN on 10/08/2021  12/6- 01/28/2022    Progress Note Due on Visit 70    PT Start Time 1344    PT Stop Time 1424    PT Time Calculation (min) 40 min    Equipment Utilized During Treatment Gait belt    Activity Tolerance Patient tolerated treatment well;No increased pain    Behavior During Therapy WFL for tasks assessed/performed             Past Medical History:  Diagnosis Date   Allergy    Penicillin   Arthritis    Basal cell carcinoma    BPH (benign prostatic hyperplasia)    Chronic kidney disease    had a kidney stone which per family was a cyst that was removed   Dysrhythmia    GERD (gastroesophageal reflux disease)    History of kidney stones    Hyperlipidemia    Hypertension    Left-sided low back pain with left-sided sciatica    Lung mass    Splenic vein thrombosis    Stroke ([place]) 05/22/2019   Thrombosis 12/2015   mural  area and no notation of heart attack    Past Surgical History:  Procedure Laterality Date   APPENDECTOMY     CATARACT EXTRACTION, BILATERAL     CYSTOSCOPY WITH INSERTION OF UROLIFT     ENDOSCOPIC RETROGRADE CHOLANGIOPANCREATOGRAPHY (ERCP) WITH PROPOFOL N/A 05/11/2020   Procedure: ENDOSCOPIC RETROGRADE CHOLANGIOPANCREATOGRAPHY (ERCP) WITH PROPOFOL;  Surgeon: [name], MD;  Location: [facility] ENDOSCOPY;  Service: Endoscopy;  Laterality: N/A;   ERCP N/A 10/23/2020   Procedure: ENDOSCOPIC RETROGRADE CHOLANGIOPANCREATOGRAPHY  (ERCP);  Surgeon: [name], MD;  Location: [facility] ENDOSCOPY;  Service: Endoscopy;  Laterality: N/A;   EYE SURGERY  08/2018   IR EXCHANGE BILIARY DRAIN  05/04/2020   KIDNEY STONE SURGERY     KNEE SURGERY     PACEMAKER INSERTION N/A 07/13/2018   Procedure: INSERTION PACEMAKER-DUEL CHAMBER INITIAL IMPLANT;  Surgeon: Isaias Cowman, MD;  Location: [facility] ORS;  Service: Cardiovascular;  Laterality: N/A;   SPINE SURGERY      There were no vitals filed for this visit.   Subjective Assessment - 12/26/21 1348     Subjective Patient and his caregiver report that he is doing well overall - Compliant with working out at least 3x/week and doing a variety of exercises that have been prescribed.    Pertinent History Pt referred for deconditioning and gait instability. He has had a decline since he last finished therapy. Pt had a hospital admission from 05/02/20 to 05/14/20 for acute cholecystitis and is now s/p cholecystostomy drain placement. Drain is still in place and pt is waiting to hear when they plan to remove it. Pt discharged to SNF and then returned home. In addition pt underwent radiation therapy for a RUL lung mass/cancer and had his last radiation treatment in September of 2021. He reports that he has continued to walk at home as his primary  exercise. Pt was previously seen at this clinic for PT/OT due to weakness/imbalance s/p CVA. Prior history from 09/21/19: Pt is a 88 year old male who presents with imbalance and difficulty with gait following a right frontal CVA with left hemiparesis on 05/22/19.  He has completed inpatient and home health PT, and now presents for OP PT.  He was discharged from inpatient rehab at a supervision level of assistance, ambulating with a RW.  His family notes that he is impulsive and will try to ambulate without his RW at home.  Family is currently providing 24/7 supervision.    Currently in Pain? Yes    Pain Score 2     Pain Location Knee    Pain Orientation  Left;Anterior    Pain Descriptors / Indicators Aching;Sore    Pain Onset More than a month ago    Pain Frequency Constant            INTERVENTIONS:    Assessed status of patient and caregiver performing his established HEP for past 2 weeks. Both patient and his caregiver report good compliance with HEP at 3x/week varying between strengthening and balance exercises. No questions or concerns regarding current home program.    Reassessed all remaining goals:  TUG= 15.5 sec with 4WW (improved from last assessed at 19.22 sec)- Goal partial met  6 min walk test= 908 feet using 4WW (improved from 750 feet on 11/05/2021)   5 x STS= 13.39 sec with min BUE support  Gait with cane= Patient able to ambulate approx 300 feet today with hurrycane, CGA with short reciprocal steps.                              PT Education - 12/26/21 1432     Education Details Review of HEP: PT plan for discharge    Person(s) Educated Patient;Caregiver(s)    Methods Explanation    Comprehension Verbalized understanding;Returned demonstration              PT Short Term Goals - 01/21/21 1438       PT SHORT TERM GOAL #1   Title Pt will be independent with HEP in order to improve strength and balance in order to decrease fall risk and improve function at home.    Baseline 01/02/2021- Patient verbalize understanding of walking and seated exercises for home program. 01/21/21 patient has been more active and understands HEP.    Time 6    Period Weeks    Status Achieved    Target Date 01/10/21               PT Long Term Goals - 12/26/21 1351       PT LONG TERM GOAL #1   Title Pt will improve BERG by at least 3 points in order to demonstrate clinically significant improvement in balance.    Baseline 09/20/20: 32/56 (Previous discharge: 03/06/20: 46/56), 12/14: 32/56, 1/13: 31/56, 01/16/21 35/56, 02/06/2021=36/56. 02/18/2021= 37/56.03/20/2021- Deferred secondary to left knee aching-  will attempt next visit. 05/06/2021= 39/56; 05/13/2021= 36/56 *patient having more left knee pain. 07/16/2021= 40/56    Time 12    Period Weeks    Status Achieved      PT LONG TERM GOAL #2   Title Pt will decrease TUG to below 14 seconds/decrease in order to demonstrate decreased fall risk.    Baseline 09/20/20: 19.2s, 12/14: 17.48, 1/13: 16.65 sec. 01/02/2021=15.75 sec using 4WW, 01/21/21  14.37 seconds. 02/06/2021=  14.92 sec using 4WW. 02/18/2021= 14.5 sec using 4WW. 03/20/2021- Deferred secondary to left knee aching- will attempt next visit. 05/06/2021= 16.32 sec avg using 4WW. 05/13/2021= 16.5 sec with 4WW *increased left knee pain. 06/11/21: 20.11 seconds (increased time to turn) using 4WW;  08/13/2021- Deferred testing secondary to patient not feeling well (recovering from recent bout of COVID). Will test next 1-2 sessions. 09/03/2021= 16.83 sec using 4WW 11/04/2021= 19.22 sec; 11/28/2021- Did not test today due to focus on postural strengthening- Will reassess next visit. 12/26/2021= 15.5 sec with 4WW    Time 12    Period Weeks    Status Partially Met    Target Date 01/28/22      PT LONG TERM GOAL #3   Title Pt will decrease 5TSTS by at least 3 seconds in order to demonstrate clinically significant improvement in LE strength.    Baseline 09/20/20: 23.1s with minA+1 for strength and balance (Previous discharge 03/06/20: 16.1s), 12/14: 20 sec, 1/13: 22 sec.   01/02/2021= 16.05 sec with light UE support. 01/21/21 01/21/21 37.90 seconds without UE, with UE support 12.93 seconds: 02/06/2021= with 1 UE 17.93 sec. 02/18/2021= 21.33 sec without UE support. 05/06/2021= 15.3 sec with BUE Support and 21. 0 sec without UE support. 05/13/2021=15.0 sec with BUE support; 09/03/2021= 16 sec without UE support.    Time 12    Period Weeks    Status Achieved      PT LONG TERM GOAL #4   Title Pt will increase his self-selected 10MWT to >0.74 m/s in order to demonstrate improvement in gait speed and improved community ambulation     Baseline 09/20/20: self-selected: 14.4s = 0.69 m/s with rollator (Previous discharge 03/06/20: self-selected: 13.6s = 0.74 m/s), 12/14: 0.75 m/s, 1/13: 0.78 m/s. 01/02/2021= 0.85 m/s using 4WW, 01/16/21 with 4 WW .99 m/s. 02/06/2021= self selected = 0.93 m/s with 2IN    Time 4    Period Weeks    Status Achieved      PT LONG TERM GOAL #5   Title Patient will increase six minute walk test distance to >1000 for progression to community ambulator and improve gait ability    Baseline 01/10/20  620 ft with rollator, 01/21/21  965 ft; 02/06/2021= 910 feet with 4WW.  02/18/2021= 950 feet with 4WW. 05/06/2021= will test next visit. 05/13/2021- Deferred secondary to increased left knee pain. 06/11/21: 724f with 48MV*knee pain throughout test. 07/11/2021= 900 feet with use of 4WW.  08/13/2021- Deferred testing secondary to patient not feeling well (recovering from recent bout of COVID). Will test next 1-2 sessions. 09/03/2021= 750 feet with use of 4WW (recovering from COVID and strained left hamstring); 10/08/2021=890 feet with use of 4WW- VC not to drag left foot. Patient was distracted in gym stopping and talking a few times limiting his performance- requiring redirection. 11/05/2021-750 feet with 46HM Increased dragging of left foot after 4 min mark- no complaint of pain only fatigue. 11/28/2021- Will reassess next visit; 12/26/2021= 908 feet with use of 4WW    Time 12    Period Weeks    Status Achieved    Target Date 01/28/22      PT LONG TERM GOAL #6   Title Patient will demonstrate ability to walk with HHA +1 > 200 ft. without loss of balance on level surfaces to simulate walking his grandaughter down the aisle for her upcoming wedding in November 2022.    Baseline 05/13/2021- Patient currently ambulating short community distance with use of 4WW but desires to  walk with his grandaughter with HHA for her wedding. 06/11/21: attempted 5 steps with HHA x1, deemed unsafe. 07/16/2021- Patient ambulated around 100 feet with HHA (arms  interlocked) with PT to simulate walking daughter down aisle.  08/13/2021- Deferred testing secondary to patient not feeling well (recovering from recent bout of COVID). Will test next 1-2 sessions.  10/08/2021=Gait with HHA  on right and use of SPC on left- To simulate walking granddaughter down the aisle- approx 220 feet today total- with CGA and progressing with no report of pain. 11/04/2021- Patient was able to successfully walk his grandaughter down the aisle a couple of weeks ago.    Time 12    Period Weeks    Status Achieved    Target Date 11/05/21      PT LONG TERM GOAL #7   Title Pt will decrease 5TSTS  to 14 sec or less with BUE support on armrest in order to demonstrate clinically significant improvement in LE strength.    Baseline 05/13/2021=15.0 sec with BUE support; 26 sec without UE support. 06/11/21: 14.61 seconds with BUE support. 07/16/2021= 23 sec without UE support. 08/13/2021= 24. 2 sec with BUE  09/03/2021= 20.97 sec with B UE support - Limited by left hamstring pain. 10/08/2021-19.0 sec with minimal BUE Support  upport. 11/05/2021= 16.31 sec with BUE support. 11/28/2021- Will reassess next visit; 12/26/2021= 13.39 sec with min BUE support    Time 12    Period Weeks    Status Achieved    Target Date 01/28/22      PT LONG TERM GOAL #8   Title Patient will demonstrate modified independence with ambulation using SPC on level surfaces at > 300 feet for improved household and short community distances without loss of balance or excessive fatigue.    Baseline 11/05/2021= Patient able to ambulate approx 100 feet with SPC, close CGA- difficulty with gait sequencing and mild limp yet no report of increased left knee pain. 11/28/2021= Will reassess next visit; 12/26/2021= Patient able to ambulate approx 300 feet today with hurrycane, CGA with short reciprocal steps.    Time 12    Period Weeks    Status Partially Met    Target Date 01/28/22                   Plan - 12/26/21 1433      Clinical Impression Statement Patient presents with good motivation after returning to clinic from 2 week hiatus performing his HEP with caregiver. He was able to verbalize a majority of his exercises: Posture, balance, LE strengthening along with walking. Today he presents with much improved overall functional strength and mobility as demonstrated by improved 5 time Sit to stand, Timed up and Go test, and improved 6 min walk test. At this time he is performing his HEP 3x/week with assist of caregiver. He has met his goals at this time and appropriate for discharge from skilled PT. He will continue with home exercise program with assistance of his caregiver.    Personal Factors and Comorbidities Age;Comorbidity 3+    Comorbidities HTN, CVA    Examination-Activity Limitations Bend;Lift;Squat;Locomotion Level;Stairs;Stand    Examination-Participation Restrictions Community Activity;Driving;Laundry;Meal Prep;Shop    Stability/Clinical Decision Making Unstable/Unpredictable    Rehab Potential Fair    PT Frequency 2x / week    PT Duration 12 weeks    PT Treatment/Interventions ADLs/Self Care Home Management;Cryotherapy;Electrical Stimulation;Iontophoresis 86m/ml Dexamethasone;Moist Heat;Traction;Ultrasound;DME Instruction;Gait training;Stair training;Therapeutic activities;Therapeutic exercise;Functional mobility training;Balance training;Neuromuscular re-education;Patient/family education;Manual techniques;Dry needling;Vestibular;Joint Manipulations;Spinal Manipulations;Aquatic Therapy  PT Next Visit Plan Plan to discharge from [place] today with goals met    PT Home Exercise Plan Access Code: [email or web address]; 11/28/2021= postural HEP-Access Code: [email or web address]    Consulted and Agree with Plan of Care Patient;Family member/caregiver    Family Member Consulted Caregiver- Music therapist             Patient will benefit from skilled therapeutic intervention in order to improve the following deficits and  impairments:  Abnormal gait, Decreased balance, Decreased mobility, Difficulty walking, Decreased knowledge of precautions, Decreased safety awareness, Decreased activity tolerance, Decreased strength, Decreased endurance  Visit Diagnosis: Abnormality of gait and mobility  Difficulty in walking, not elsewhere classified  Muscle weakness (generalized)  Unsteadiness on feet     Problem List Patient Active Problem List   Diagnosis Date Noted   Atherosclerosis of native arteries of extremity with intermittent claudication ([place]) 11/25/2021   Localized osteoarthritis of knees, bilateral 06/13/2021   Moderate protein-calorie malnutrition ([place]) 03/11/2021   Hemiparesis affecting left side as late effect of cerebrovascular accident (CVA) ([place]) 03/11/2021   Aneurysm, aorta, thoracic 12/21/2020   Calculus of bile duct without cholecystitis and without obstruction    Non-small cell carcinoma of lung, right ([place]) 09/07/2020   Mass of upper lobe of right lung 07/13/2020   Calculus of bile duct with cholecystitis without obstruction    Abnormal findings on imaging of biliary tract    Acute cholecystitis 05/02/2020   Chronic venous insufficiency 12/05/2019   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    CKD (chronic kidney disease), stage II    New onset atrial fibrillation ([place])    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   Goals of care, counseling/discussion    Palliative care by specialist    DNR (do not resuscitate) discussion    History of CVA (cerebrovascular accident) 05/22/2019   Persistent proteinuria 07/30/2018   M[place]itz type 2 second degree heart block 07/13/2018   Chronic kidney disease, stage III (moderate) ([place]) 06/15/2018   Leg pain 05/06/2018   Lymphedema 05/06/2018   Coagulopathy ([place]) 07/07/2017   Lumbar spondylosis 03/10/2017   Elevated uric acid in blood 11/20/2016   BPH (benign prostatic hyperplasia) 05/29/2016   Peripheral vascular disease of lower  extremity ([place]) 03/03/2016   Gallstone 12/31/2015   Splenic infarct 12/31/2015   Splenic vein thrombosis 11/27/2015   Atherosclerosis of aorta ([place]) 11/22/2015   Carotid artery narrowing 11/22/2015   Diverticulosis of colon 11/22/2015   Decreased creatinine clearance 11/22/2015   Arthritis, degenerative 11/22/2015   Lactose intolerance 11/22/2015   Basal cell carcinoma 11/22/2015   Essential hypertension 06/21/2015   Hyperlipemia 06/21/2015   GERD (gastroesophageal reflux disease) 06/21/2015   Calculus of kidney 11/18/2013    Lewis Moccasin, PT 12/26/2021, 2:46 PM  [place] [address] [place], [place], [address] Phone: [phone]   Fax:  [phone]  Name: Jose Castro MRN: [id number] Date of Birth: [birth year]

## 2021-12-30 ENCOUNTER — Ambulatory Visit: Payer: Medicare Other | Admitting: Radiation Oncology

## 2021-12-31 ENCOUNTER — Ambulatory Visit: Payer: Medicare Other

## 2022-01-01 ENCOUNTER — Other Ambulatory Visit: Payer: Self-pay | Admitting: Family Medicine

## 2022-01-02 ENCOUNTER — Ambulatory Visit: Payer: Medicare Other

## 2022-01-07 ENCOUNTER — Ambulatory Visit: Payer: Medicare Other

## 2022-01-09 ENCOUNTER — Ambulatory Visit: Payer: Medicare Other

## 2022-01-14 ENCOUNTER — Ambulatory Visit: Payer: Medicare Other

## 2022-01-16 ENCOUNTER — Ambulatory Visit: Payer: Medicare Other

## 2022-01-21 ENCOUNTER — Ambulatory Visit: Payer: Medicare Other

## 2022-01-23 ENCOUNTER — Ambulatory Visit: Payer: Medicare Other

## 2022-01-28 ENCOUNTER — Ambulatory Visit: Payer: Medicare Other

## 2022-01-30 ENCOUNTER — Ambulatory Visit: Payer: Medicare Other

## 2022-02-13 ENCOUNTER — Ambulatory Visit: Payer: Medicare Other | Admitting: Radiation Oncology

## 2022-02-14 ENCOUNTER — Other Ambulatory Visit: Payer: Self-pay | Admitting: Family Medicine

## 2022-02-14 NOTE — Telephone Encounter (Signed)
Filled for 90 days 01/01/22 at in town [place].  ?Requested Prescriptions  ?Pending Prescriptions Disp Refills  ?? finasteride (PROSCAR) 5 MG tablet [Pharmacy Med Name: FINASTERIDE 5 MG TAB] 90 tablet 0  ?  Sig: TAKE 1 TABLET BY MOUTH DAILY  ?  ? Urology: 5-alpha Reductase Inhibitors Failed - 02/14/2022  4:55 PM  ?  ?  Failed - PSA in normal range and within 360 days  ?  PSA  ?Date Value Ref Range Status  ?05/31/2014 Normal, 1.8  Final  ?   ?  ?  Passed - Valid encounter within last 12 months  ?  Recent Outpatient Visits   ?      ? 5 months ago Stage 3a chronic kidney disease ([place])  ? [facility] [place], Drue Stager, MD  ? 6 months ago Cough  ? [facility] [place], Drue Stager, MD  ? 11 months ago Moderate protein-calorie malnutrition ([place])  ? [facility], Drue Stager, MD  ? 1 year ago Moderate protein-calorie malnutrition [facility])  ? [facility] Steele Sizer, MD  ? 1 year ago History of cholecystitis  ? [facility] Steele Sizer, MD  ?  ?  ? ?  ?  ?  ? ? ?

## 2022-04-07 ENCOUNTER — Ambulatory Visit: Payer: Medicare Other

## 2022-04-10 ENCOUNTER — Telehealth: Payer: Self-pay | Admitting: Family Medicine

## 2022-04-10 NOTE — Telephone Encounter (Signed)
Copied from [place] [id number]. Topic: Medicare AWV ?>> Apr 10, 2022  3:19 PM Cher Nakai R wrote: ?Reason for CRM:  ?Left message for patient to call back and schedule Medicare Annual Wellness Visit (AWV) in office.  ? ?If unable to come into the office for AWV,  please offer to do virtually or by telephone. ? ?Last AWV: 03/28/2021 ? ?Please schedule at anytime with [place]. ? ?30 minute appointment for Virtual or phone ?45 minute appointment for in office or Initial virtual/phone ? ?Any questions, please contact me at [phone] ?

## 2022-04-11 ENCOUNTER — Ambulatory Visit: Payer: Medicare Other | Admitting: Radiation Oncology

## 2022-04-11 ENCOUNTER — Ambulatory Visit
Admission: RE | Admit: 2022-04-11 | Discharge: 2022-04-11 | Disposition: A | Discharge status: Home / Self Care | Payer: Medicare Other | Source: Ambulatory Visit | Attending: Radiation Oncology | Admitting: Radiation Oncology

## 2022-04-11 DIAGNOSIS — C3411 Malignant neoplasm of upper lobe, right bronchus or lung: Secondary | ICD-10-CM | POA: Insufficient documentation

## 2022-04-11 LAB — POCT I-STAT CREATININE: Creatinine, Ser: 1.5 mg/dL — ABNORMAL HIGH (ref 0.61–1.24)

## 2022-04-11 MED ORDER — IOHEXOL 300 MG/ML  SOLN
75.0000 mL | Freq: Once | INTRAMUSCULAR | Status: AC | PRN
  Administered 2022-04-11: 60 mL via INTRAVENOUS

## 2022-04-14 ENCOUNTER — Other Ambulatory Visit: Payer: Self-pay | Admitting: *Deleted

## 2022-04-14 ENCOUNTER — Encounter: Payer: Self-pay | Admitting: Radiation Oncology

## 2022-04-14 ENCOUNTER — Ambulatory Visit
Admission: RE | Admit: 2022-04-14 | Discharge: 2022-04-14 | Disposition: A | Discharge status: Home / Self Care | Payer: Medicare Other | Source: Ambulatory Visit | Attending: Radiation Oncology | Admitting: Radiation Oncology

## 2022-04-14 VITALS — BP 124/68 | HR 84 | Resp 20 | Wt 164.2 lb

## 2022-04-14 DIAGNOSIS — C3411 Malignant neoplasm of upper lobe, right bronchus or lung: Secondary | ICD-10-CM | POA: Diagnosis not present

## 2022-04-14 DIAGNOSIS — Z923 Personal history of irradiation: Secondary | ICD-10-CM | POA: Insufficient documentation

## 2022-04-14 NOTE — Progress Notes (Signed)
Radiation Oncology ?Follow up Note ? ?Name: Jose Castro   ?Date:   04/14/2022 ?MRN:  [id number] ?DOB: 15-Jul-1926  ? ? ?This 88 y.o. male presents to the clinic today for 67-month follow-up status post SBRT to the posterior portion of the right upper lobe for stage I non-small cell lung cancer.. ? ?REFERRING PROVIDER: Steele Sizer, MD ? ?HPI: Patient is a 88 year old male now out 18 months having completed SBRT to his posterior portion of the right upper lobe for stage I non-small cell lung cancer.  Seen today in routine follow-up he is doing well he specifically [place] cough hemoptysis or chest tightness..  He had a recent CT scan which I have reviewed shows continuation of evolution of radiation scarring in the posterior part of the right upper lobe no other evidence of disease.  There is not been an official read this is my interpretation. ? ?COMPLICATIONS OF TREATMENT: none ? ?FOLLOW UP COMPLIANCE: keeps appointments  ? ?PHYSICAL EXAM:  ?BP 124/68   Pulse 84   Resp 20   Wt 164 lb 3.2 oz (74.5 kg)   BMI 23.56 kg/m?  ?Well-developed well-nourished patient in NAD. HEENT reveals PERLA, EOMI, discs not visualized.  Oral cavity is clear. No oral mucosal lesions are identified. Neck is clear without evidence of cervical or supraclavicular adenopathy. Lungs are clear to A&P. Cardiac examination is essentially unremarkable with regular rate and rhythm without murmur rub or thrill. Abdomen is benign with no organomegaly or masses noted. Motor sensory and DTR levels are equal and symmetric in the upper and lower extremities. Cranial nerves II through XII are grossly intact. Proprioception is intact. No peripheral adenopathy or edema is identified. No motor or sensory levels are noted. Crude visual fields are within normal range. ? ?RADIOLOGY RESULTS: Serial CT scans reviewed compatible with above-stated findings ? ?PLAN: Present time patient continues to do well no evidence of progressive disease in his chest.  Of asked  to see him back in 1 year with a follow-up CT scan.  Patient and family comprehend the recommendations well.  They know to call with any concerns. ? ?I would like to take this opportunity to thank you for allowing me to participate in the care of your patient.. ?  ? Noreene Filbert, MD ? ?

## 2022-05-13 ENCOUNTER — Telehealth: Payer: Self-pay | Admitting: *Deleted

## 2022-05-13 NOTE — Telephone Encounter (Addendum)
Daughter called stating hat the CT results show that patient should have 3 - 6 month follow up and he was scheduled for 1 yr follow up with Dr Baruch Gouty. She is requesting follow up in 3 - 6 months    IMPRESSION: 1. Stable appearance of the treated lesion in the right upper lobe with surrounding radiation changes. 2. Enlarging solid and part solid nodules in both lungs which are not typical for metastatic disease, but suspicious for possible multifocal adenocarcinoma. These currently do not have solid components large enough to optimally evaluate by PET-CT. Recommend continued chest CT follow-up in 3-6 months. 3. No thoracic adenopathy, pleural or pericardial effusion. 4. Coronary and aortic atherosclerosis (ICD10-I70.0). Emphysema (ICD10-J43.9).     Electronically Signed   By: Richardean Sale M.D.   On: 04/14/2022 16:35

## 2022-05-29 ENCOUNTER — Encounter: Payer: Self-pay | Admitting: Physician Assistant

## 2022-05-29 ENCOUNTER — Telehealth: Payer: Self-pay | Admitting: Family Medicine

## 2022-05-29 ENCOUNTER — Ambulatory Visit (INDEPENDENT_AMBULATORY_CARE_PROVIDER_SITE_OTHER): Payer: Medicare Other | Admitting: Physician Assistant

## 2022-05-29 DIAGNOSIS — Z1283 Encounter for screening for malignant neoplasm of skin: Secondary | ICD-10-CM | POA: Diagnosis not present

## 2022-05-29 DIAGNOSIS — Z0000 Encounter for general adult medical examination without abnormal findings: Secondary | ICD-10-CM | POA: Diagnosis not present

## 2022-05-29 NOTE — Progress Notes (Deleted)
Established Patient Office Visit  Name: Jose Castro   MRN: [id number]    DOB: 1926-03-15   Date:05/29/2022  Today's Provider: Talitha Givens, MHS, PA-C Introduced myself to the patient as a PA-C and provided education on APPs in clinical practice.         Subjective  Chief Complaint  Chief Complaint  Patient presents with   Medicare Wellness    HPI   Patient Active Problem List   Diagnosis Date Noted   Atherosclerosis of native arteries of extremity with intermittent claudication ([place]) 11/25/2021   Localized osteoarthritis of knees, bilateral 06/13/2021   Moderate protein-calorie malnutrition ([place]) 03/11/2021   Hemiparesis affecting left side as late effect of cerebrovascular accident (CVA) ([place]) 03/11/2021   Aneurysm, aorta, thoracic ([place]) 12/21/2020   Calculus of bile duct without cholecystitis and without obstruction    Non-small cell carcinoma of lung, right ([place]) 09/07/2020   Mass of upper lobe of right lung 07/13/2020   Calculus of bile duct with cholecystitis without obstruction    Abnormal findings on imaging of biliary tract    Acute cholecystitis 05/02/2020   Chronic venous insufficiency 12/05/2019   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    CKD (chronic kidney disease), stage II    New onset atrial fibrillation ([place])    Right middle cerebral artery stroke ([place]) 05/27/2019   Goals of care, counseling/discussion    Palliative care by specialist    DNR (do not resuscitate) discussion    History of CVA (cerebrovascular accident) 05/22/2019   Persistent proteinuria 07/30/2018   Mobitz type 2 second degree heart block 07/13/2018   Chronic kidney disease, stage III (moderate) ([place]) 06/15/2018   Leg pain 05/06/2018   Lymphedema 05/06/2018   Coagulopathy ([place]) 07/07/2017   Lumbar spondylosis 03/10/2017   Elevated uric acid in blood 11/20/2016   BPH (benign prostatic hyperplasia) 05/29/2016   Peripheral vascular disease of lower extremity ([place])  03/03/2016   Gallstone 12/31/2015   Splenic infarct 12/31/2015   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   Atherosclerosis of aorta ([place]) 11/22/2015   Carotid artery narrowing 11/22/2015   Diverticulosis of colon 11/22/2015   Decreased creatinine clearance 11/22/2015   Arthritis, degenerative 11/22/2015   Lactose intolerance 11/22/2015   Basal cell carcinoma 11/22/2015   Essential hypertension 06/21/2015   Hyperlipemia 06/21/2015   GERD (gastroesophageal reflux disease) 06/21/2015   Calculus of kidney 11/18/2013    Past Surgical History:  Procedure Laterality Date   APPENDECTOMY     CATARACT EXTRACTION, BILATERAL     CYSTOSCOPY WITH INSERTION OF UROLIFT     ENDOSCOPIC RETROGRADE CHOLANGIOPANCREATOGRAPHY (ERCP) WITH PROPOFOL N/A 05/11/2020   Procedure: ENDOSCOPIC RETROGRADE CHOLANGIOPANCREATOGRAPHY (ERCP) WITH PROPOFOL;  Surgeon: [name], MD;  Location: [facility] ENDOSCOPY;  Service: Endoscopy;  Laterality: N/A;   ERCP N/A 10/23/2020   Procedure: ENDOSCOPIC RETROGRADE CHOLANGIOPANCREATOGRAPHY (ERCP);  Surgeon: [name], MD;  Location: [facility] ENDOSCOPY;  Service: Endoscopy;  Laterality: N/A;   EYE SURGERY  08/2018   IR EXCHANGE BILIARY DRAIN  05/04/2020   KIDNEY STONE SURGERY     KNEE SURGERY     PACEMAKER INSERTION N/A 07/13/2018   Procedure: INSERTION PACEMAKER-DUEL CHAMBER INITIAL IMPLANT;  Surgeon: Isaias Cowman, MD;  Location: [facility] ORS;  Service: Cardiovascular;  Laterality: N/A;   SPINE SURGERY      Family History  Problem Relation Age of Onset   Cancer Brother        bladder cancer with mets  Heart disease Mother    Aortic aneurysm Mother    Heart attack Maternal Aunt    Heart attack Maternal Uncle     Social History   Tobacco Use   Smoking status: Former    Packs/day: 0.00    Years: 16.00    Total pack years: 0.00    Types: Cigarettes    Start date: 10/21/1944    Quit date: 10/21/1960    Years since quitting: 61.6   Smokeless tobacco: Never  Substance  Use Topics   Alcohol use: Yes    Alcohol/week: 3.0 standard drinks of alcohol    Types: 3 Standard drinks or equivalent per week    Comment: once a week when he goes out to dinner-socially     Current Outpatient Medications:    acetaminophen (TYLENOL) 325 MG tablet, Take 2 tablets (650 mg total) by mouth every 4 (four) hours as needed for mild pain (or temp > 37.5 C (99.5 F))., Disp:  , Rfl:    apixaban (ELIQUIS) 2.5 MG TABS tablet, Take 1 tablet (2.5 mg total) by mouth 2 (two) times daily., Disp: 60 tablet, Rfl: 0   Cholecalciferol (VITAMIN D3) 50 MCG (2000 UT) TABS, Take 2,000 Units by mouth daily., Disp: 30 tablet, Rfl: 0   Ensure Max Protein (ENSURE MAX PROTEIN) LIQD, Take 330 mLs (11 oz total) by mouth 2 (two) times daily., Disp: , Rfl:    finasteride (PROSCAR) 5 MG tablet, TAKE 1 TABLET BY MOUTH DAILY, Disp: 90 tablet, Rfl: 0   magnesium oxide (MAG-OX) 400 MG tablet, Take 1 tablet (400 mg total) by mouth daily., Disp: 60 tablet, Rfl: 0   metoprolol succinate (TOPROL XL) 25 MG 24 hr tablet, Take 1 tablet (25 mg total) by mouth daily., Disp: 30 tablet, Rfl: 0   Multiple Vitamins-Minerals (ZINC PO), Take 500 mg by mouth 2 (two) times daily., Disp: , Rfl:    Omega-3 Fatty Acids (FISH OIL) 1000 MG CAPS, Take 1,000 mg by mouth daily. , Disp: , Rfl:    pantoprazole (PROTONIX) 40 MG tablet, TAKE ONE TABLET EVERY DAY, Disp: 90 tablet, Rfl: 3   potassium chloride SA (KLOR-CON) 20 MEQ tablet, Take 1 tablet (20 mEq total) by mouth daily., Disp: 30 tablet, Rfl: 0   rosuvastatin (CRESTOR) 40 MG tablet, Take 1 tablet (40 mg total) by mouth daily at 6 PM., Disp: 30 tablet, Rfl: 0   ursodiol (ACTIGALL) 300 MG capsule, Take 300 mg by mouth daily., Disp: , Rfl:    amoxicillin-clavulanate (AUGMENTIN) 500-125 MG tablet, SMARTSIG:1 Tablet(s) By Mouth Every 12 Hours, Disp: , Rfl:   Allergies  Allergen Reactions   Penicillins Itching and Swelling    Pt reported arm rash and swelling with penicillin  administration while he was in the [employer].  However, pt was able to tolerate IV Unasyn without any problem, so penicillin allergy unlikely to be real or serious.  05/14/2020.    I personally reviewed {Reviewed:14835} with the patient/caregiver today.   ROS    Objective  There were no vitals filed for this visit.  There is no height or weight on file to calculate BMI.  Physical Exam   Recent Results (from the past 2160 hour(s))  I-STAT creatinine     Status: Abnormal   Collection Time: 04/11/22  3:07 PM  Result Value Ref Range   Creatinine, Ser 1.50 (H) 0.61 - 1.24 mg/dL     PHQ2/9:    05/29/2022   11:57 AM 09/10/2021   12:57 PM  08/16/2021   10:55 AM 03/28/2021    1:32 PM 03/11/2021    1:25 PM  Depression screen PHQ 2/9  Decreased Interest 0 0 0 0 0  Down, Depressed, Hopeless 0 0 0 0 0  PHQ - 2 Score 0 0 0 0 0  Altered sleeping 0      Tired, decreased energy 0      Change in appetite 0      Feeling bad or failure about yourself  0      Trouble concentrating 0      Moving slowly or fidgety/restless 0      Suicidal thoughts 0      PHQ-9 Score 0      Difficult doing work/chores Not difficult at all          Fall Risk:    05/29/2022   11:57 AM 09/10/2021   12:57 PM 08/16/2021   10:55 AM 03/28/2021    1:34 PM 03/11/2021    1:25 PM  Fall Risk   Falls in the past year? 0 0 1 0 0  Number falls in past yr: 0 0 0 0 0  Injury with Fall? 0 0 0 0 0  Risk for fall due to : No Fall Risks Impaired balance/gait Impaired balance/gait;Impaired mobility Impaired balance/gait   Follow up Falls prevention discussed;Education provided Falls prevention discussed Falls prevention discussed Falls prevention discussed       Functional Status Survey: Is the patient deaf or have difficulty hearing?: Yes Does the patient have difficulty seeing, even when wearing glasses/contacts?: Yes Does the patient have difficulty concentrating, remembering, or making decisions?: No Does the  patient have difficulty walking or climbing stairs?: Yes Does the patient have difficulty dressing or bathing?: Yes Does the patient have difficulty doing errands alone such as visiting a doctor's office or shopping?: Yes    Assessment & Plan

## 2022-05-29 NOTE — Telephone Encounter (Signed)
Called back daughter no answer, wanted to let her know Talitha Givens, PA is currenlty doing his visit.

## 2022-05-29 NOTE — Progress Notes (Signed)
Patient: Jose Castro, Male    DOB: [birth year], 88 y.o.   MRN: [id number]  Visit Date: 05/29/2022  Today's Provider: Dani Gobble Lilli Dewald, PA-C  Introduced myself to the patient as a PA-C and provided education on APPs in clinical practice.     I connected with Jose Castro today by telephone and verified that I am speaking with the correct person using two identifiers. Location patient: home, [place], [place]  Location provider: [facility], [place], [place] participating in the virtual visit: patient, nurse.    I discussed the limitations, risks, security and privacy concerns of performing an evaluation and management service by telephone and the availability of in person appointments. I also discussed with the patient that there may be a patient responsible charge related to this service. The patient expressed understanding and verbally consented to this telephonic visit.    Interactive audio and video telecommunications were attempted between this provider and patient, however failed, due to patient having technical difficulties OR patient did not have access to video capability.  We continued and completed visit with audio only.  Some vital signs may be absent or patient reported.   Time Spent with patient on telephone encounter: 25 minutes  Chief Complaint  Patient presents with   Medicare Wellness    Subjective:   Jose Castro is a 88 y.o. male who presents today for his Subsequent Annual Wellness Visit. Daughter is available to assist with HPI and answer questions.   HPI    Review of Systems  Constitutional:  Negative for chills, diaphoresis, fatigue, fever and unexpected weight change.  HENT:  Positive for hearing loss. Negative for voice change.   Respiratory:  Negative for cough and shortness of breath.   Neurological:  Negative for weakness, light-headedness and headaches.  Psychiatric/Behavioral:  Negative for dysphoric mood and sleep disturbance.  The patient is not nervous/anxious.      Past Medical History:  Diagnosis Date   Allergy    Penicillin   Arthritis    Basal cell carcinoma    BPH (benign prostatic hyperplasia)    Chronic kidney disease    had a kidney stone which per family was a cyst that was removed   Dysrhythmia    GERD (gastroesophageal reflux disease)    History of kidney stones    Hyperlipidemia    Hypertension    Left-sided low back pain with left-sided sciatica    Lung mass    Splenic vein thrombosis    Stroke ([place]) 05/22/2019   Thrombosis 12/2015   mural  area and no notation of heart attack    Past Surgical History:  Procedure Laterality Date   APPENDECTOMY     CATARACT EXTRACTION, BILATERAL     CYSTOSCOPY WITH INSERTION OF UROLIFT     ENDOSCOPIC RETROGRADE CHOLANGIOPANCREATOGRAPHY (ERCP) WITH PROPOFOL N/A 05/11/2020   Procedure: ENDOSCOPIC RETROGRADE CHOLANGIOPANCREATOGRAPHY (ERCP) WITH PROPOFOL;  Surgeon: [name], MD;  Location: [facility] ENDOSCOPY;  Service: Endoscopy;  Laterality: N/A;   ERCP N/A 10/23/2020   Procedure: ENDOSCOPIC RETROGRADE CHOLANGIOPANCREATOGRAPHY (ERCP);  Surgeon: [name], MD;  Location: [facility] ENDOSCOPY;  Service: Endoscopy;  Laterality: N/A;   EYE SURGERY  08/2018   IR EXCHANGE BILIARY DRAIN  05/04/2020   KIDNEY STONE SURGERY     KNEE SURGERY     PACEMAKER INSERTION N/A 07/13/2018   Procedure: INSERTION PACEMAKER-DUEL CHAMBER INITIAL IMPLANT;  Surgeon: Isaias Cowman, MD;  Location: [facility] ORS;  Service: Cardiovascular;  Laterality: N/A;   [place]  Family History  Problem Relation Age of Onset   Cancer Brother        bladder cancer with mets   Heart disease Mother    Aortic aneurysm Mother    Heart attack Maternal Aunt    Heart attack Maternal Uncle     Social History   Socioeconomic History   Marital status: Widowed    Spouse name: Not on file   Number of children: 6   Years of education: Not on file   Highest education level: Some college,  no degree  Occupational History   Occupation: retired    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  Tobacco Use   Smoking status: Former    Packs/day: 0.00    Years: 16.00    Total pack years: 0.00    Types: Cigarettes    Start date: 10/21/1944    Quit date: 10/21/1960    Years since quitting: 61.6   Smokeless tobacco: Never  Vaping Use   Vaping Use: Never used  Substance and Sexual Activity   Alcohol use: Yes    Alcohol/week: 3.0 standard drinks of alcohol    Types: 3 Standard drinks or equivalent per week    Comment: once a week when he goes out to dinner-socially   Drug use: No   Sexual activity: Not Currently    Partners: Female  Other Topics Concern   Not on file  Social History Narrative   Widow, lives alone but 4 of his children rotate staying with him since July 2020 due to stroke and he is not left alone but does most things independently.       Smoke 15-20 years; quit 60 years ago. Rare alcohol. 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    Social Determinants of Health   Financial Resource Strain: Low Risk  (05/29/2022)   Overall Financial Resource Strain (CARDIA)    Difficulty of Paying Living Expenses: Not hard at all  Food Insecurity: No Food Insecurity (05/29/2022)   Hunger Vital Sign    Worried About Running Out of Food in the Last Year: Never true    Ran Out of Food in the Last Year: Never true  Transportation Needs: Unmet Transportation Needs (05/29/2022)   PRAPARE - Transportation    Lack of Transportation (Medical): Yes    Lack of Transportation (Non-Medical): Yes  Physical Activity: Sufficiently Active (05/29/2022)   Exercise Vital Sign    Days of Exercise per Week: 5 days    Minutes of Exercise per Session: 30 min  Stress: No Stress Concern Present (05/29/2022)   [place]    Feeling of Stress : Not at all  Social Connections: Moderately Integrated (05/29/2022)   Social Connection and Isolation Panel  [NHANES]    Frequency of Communication with Friends and Family: More than three times a week    Frequency of Social Gatherings with Friends and Family: More than three times a week    Attends Religious Services: More than 4 times per year    Active Member of [employer] or Organizations: Yes    Attends Archivist Meetings: More than 4 times per year    Marital Status: Widowed  Intimate Partner Violence: Not At Risk (05/29/2022)   Humiliation, Afraid, Rape, and Kick questionnaire    Fear of Current or Ex-Partner: No    Emotionally Abused: No    Physically Abused: No    Sexually Abused: No    Outpatient Encounter Medications  as of 05/29/2022  Medication Sig   acetaminophen (TYLENOL) 325 MG tablet Take 2 tablets (650 mg total) by mouth every 4 (four) hours as needed for mild pain (or temp > 37.5 C (99.5 F)).   apixaban (ELIQUIS) 2.5 MG TABS tablet Take 1 tablet (2.5 mg total) by mouth 2 (two) times daily.   Cholecalciferol (VITAMIN D3) 50 MCG (2000 UT) TABS Take 2,000 Units by mouth daily.   Ensure Max Protein (ENSURE MAX PROTEIN) LIQD Take 330 mLs (11 oz total) by mouth 2 (two) times daily.   finasteride (PROSCAR) 5 MG tablet TAKE 1 TABLET BY MOUTH DAILY   magnesium oxide (MAG-OX) 400 MG tablet Take 1 tablet (400 mg total) by mouth daily.   metoprolol succinate (TOPROL XL) 25 MG 24 hr tablet Take 1 tablet (25 mg total) by mouth daily.   Multiple Vitamins-Minerals (ZINC PO) Take 500 mg by mouth 2 (two) times daily.   Omega-3 Fatty Acids (FISH OIL) 1000 MG CAPS Take 1,000 mg by mouth daily.    pantoprazole (PROTONIX) 40 MG tablet TAKE ONE TABLET EVERY DAY   potassium chloride SA (KLOR-CON) 20 MEQ tablet Take 1 tablet (20 mEq total) by mouth daily.   rosuvastatin (CRESTOR) 40 MG tablet Take 1 tablet (40 mg total) by mouth daily at 6 PM.   ursodiol (ACTIGALL) 300 MG capsule Take 300 mg by mouth daily.   amoxicillin-clavulanate (AUGMENTIN) 500-125 MG tablet SMARTSIG:1 Tablet(s) By Mouth  Every 12 Hours   No facility-administered encounter medications on file as of 05/29/2022.    Allergies  Allergen Reactions   Penicillins Itching and Swelling    Pt reported arm rash and swelling with penicillin administration while he was in the [employer].  However, pt was able to tolerate IV Unasyn without any problem, so penicillin allergy unlikely to be real or serious.  05/14/2020.    Care Team Updated in EHR: Yes  Last Vision Exam: Last year.  Wears corrective lenses: Yes readers  Last Dental Exam: Jan 2022 Last Hearing Exam: About 2 years ago.  Wears Hearing Aids: No has hearing aids available.   Functional Ability / Safety Screening 1.  Was the timed Get Up and Go test longer than 30 seconds?   Unable to complete due to virtual visit. 2.  Does the patient need help with the phone, transportation, shopping,      preparing meals, housework, laundry, medications, or managing money?  yes Family has someone available with him during the mornings to assist with meal prep. Family assists with driving and shopping  3.  Does the patient's home have:  loose throw rugs in the hallway?   no      Grab bars in the bathroom? yes      Handrails on the stairs?   yes      Poor lighting?   yes 4.  Has the patient noticed any hearing difficulties?   no  Diet Recall and Exercise Regimen:  Diet: Normal diet  Exercise: Somewhat, he is supposed to be walking 20 minutes 3 times per day, along with band exercises.   Fall Risk:  See screening under Objective Information  Depression Screen:  See screening under Objective Information  Advanced Directives: A voluntary discussion about advance care planning including the explanation and discussion of advance directives was discussed with the patient. Explanation about the health care proxy and living will was reviewed.  During this discussion, the patient was able to identify a health care proxy as Jose Castro  his daughter. Does patient have a HCPOA?     yes If yes, name and contact information: Jose Castro his daughter. Does patient have a living will or MOST form?  no  Cancer Screenings: Skin: Does not regularly see Derm for skin cancer checks. Lung: Low Dose CT Chest recommended if Age 18-80 years, 30 pack-year currently smoking OR have quit w/in 15years. Patient does not qualify.  Lifestyle risk factor issued reviewed: Diet, exercise, weight management, advised patient smoking is not healthy, nutrition/diet.   Prostate: No longer indicated per Urology.  Colon: No longer indicated   Additional Screenings:  Hepatitis B/HIV/Syphillis: Hepatitis C Screening:  Intimate Partner Violence:  AAA Screen: Men age 30 to 51 years if ever smoked recommended to get a one time AAA ultrasound screening exam. Patient does not qualify.  Objective:   Vitals: There were no vitals taken for this visit. There is no height or weight on file to calculate BMI.  Lab Results  Component Value Date   CHOL 98 03/11/2021   CHOL 109 03/06/2020   CHOL 174 05/22/2019   Lab Results  Component Value Date   HDL 35 (L) 03/11/2021   HDL 35 (L) 03/06/2020   HDL 47 05/22/2019   Lab Results  Component Value Date   LDLCALC 39 03/11/2021   LDLCALC 56 03/06/2020   LDLCALC 115 (H) 05/22/2019   Lab Results  Component Value Date   TRIG 153 (H) 03/11/2021   TRIG 93 03/06/2020   TRIG 60 05/22/2019   Lab Results  Component Value Date   CHOLHDL 2.8 03/11/2021   CHOLHDL 3.1 03/06/2020   CHOLHDL 3.7 05/22/2019   No results found for: "LDLDIRECT"  No results found.  Physical exam was not performed due to telephonic nature of visit.    Cognitive Testing - 6-CIT  Correct? Score   What year is it? yes 0 Yes = 0    No = 4  What month is it? yes 0 Yes = 0    No = 3  Remember:     Pia [address]au, [place], [place]     What time is it? yes 0 Yes = 0    No = 3  Count backwards from 20 to 1 yes 0 Correct = 0    1 error = 2   More than 1 error = 4  Say the  months of the year in reverse. yes 0 Correct = 0    1 error = 2   More than 1 error = 4  What address did I ask you to remember? yes 0 Correct = 0  1 error = 2    2 error = 4    3 error = 6    4 error = 8    All wrong = 10       TOTAL SCORE  0/28   Interpretation:  Normal  Normal (0-7) Abnormal (8-28)   Fall Risk:    05/29/2022   11:57 AM 09/10/2021   12:57 PM 08/16/2021   10:55 AM 03/28/2021    1:34 PM 03/11/2021    1:25 PM  Fall Risk   Falls in the past year? 0 0 1 0 0  Number falls in past yr: 0 0 0 0 0  Injury with Fall? 0 0 0 0 0  Risk for fall due to : No Fall Risks Impaired balance/gait Impaired balance/gait;Impaired mobility Impaired balance/gait   Follow up Falls prevention discussed;Education provided Falls prevention discussed  Falls prevention discussed Falls prevention discussed     Depression Screen    05/29/2022   11:57 AM 09/10/2021   12:57 PM 08/16/2021   10:55 AM 03/28/2021    1:32 PM 03/11/2021    1:25 PM  Depression screen PHQ 2/9  Decreased Interest 0 0 0 0 0  Down, Depressed, Hopeless 0 0 0 0 0  PHQ - 2 Score 0 0 0 0 0  Altered sleeping 0      Tired, decreased energy 0      Change in appetite 0      Feeling bad or failure about yourself  0      Trouble concentrating 0      Moving slowly or fidgety/restless 0      Suicidal thoughts 0      PHQ-9 Score 0      Difficult doing work/chores Not difficult at all        Recent Results (from the past 2160 hour(s))  I-STAT creatinine     Status: Abnormal   Collection Time: 04/11/22  3:07 PM  Result Value Ref Range   Creatinine, Ser 1.50 (H) 0.61 - 1.24 mg/dL     Assessment & Plan:    There are no diagnoses linked to this encounter.   Exercise Activities and Dietary recommendations  Goals      DIET - INCREASE WATER INTAKE     Recommend drinking 6-8 glasses of water per day.        Discussed health benefits of physical activity, and encouraged him to engage in regular exercise appropriate for his  age and condition.   Immunization History  Administered Date(s) Administered   Fluad Quad(high Dose 65+) 09/07/2020   Moderna Sars-Covid-2 Vaccination 01/23/2020, 02/21/2020   Pneumococcal Conjugate-13 02/06/2014   Pneumococcal Polysaccharide-23 06/08/2012   Tdap 06/08/2012    Health Maintenance  Topic Date Due   COVID-19 Vaccine (3 - Moderna risk series) 06/14/2022 (Originally 03/20/2020)   Zoster Vaccines- Shingrix (1 of 2) 08/29/2022 (Originally 08/28/1945)   TETANUS/TDAP  06/08/2022   INFLUENZA VACCINE  07/01/2022   Pneumonia Vaccine 26+ Years old  Completed   HPV VACCINES  Aged Out     No orders of the defined types were placed in this encounter.   Current Outpatient Medications:    acetaminophen (TYLENOL) 325 MG tablet, Take 2 tablets (650 mg total) by mouth every 4 (four) hours as needed for mild pain (or temp > 37.5 C (99.5 F))., Disp:  , Rfl:    apixaban (ELIQUIS) 2.5 MG TABS tablet, Take 1 tablet (2.5 mg total) by mouth 2 (two) times daily., Disp: 60 tablet, Rfl: 0   Cholecalciferol (VITAMIN D3) 50 MCG (2000 UT) TABS, Take 2,000 Units by mouth daily., Disp: 30 tablet, Rfl: 0   Ensure Max Protein (ENSURE MAX PROTEIN) LIQD, Take 330 mLs (11 oz total) by mouth 2 (two) times daily., Disp: , Rfl:    finasteride (PROSCAR) 5 MG tablet, TAKE 1 TABLET BY MOUTH DAILY, Disp: 90 tablet, Rfl: 0   magnesium oxide (MAG-OX) 400 MG tablet, Take 1 tablet (400 mg total) by mouth daily., Disp: 60 tablet, Rfl: 0   metoprolol succinate (TOPROL XL) 25 MG 24 hr tablet, Take 1 tablet (25 mg total) by mouth daily., Disp: 30 tablet, Rfl: 0   Multiple Vitamins-Minerals (ZINC PO), Take 500 mg by mouth 2 (two) times daily., Disp: , Rfl:    Omega-3 Fatty Acids (FISH OIL) 1000 MG CAPS, Take 1,000 mg by mouth  daily. , Disp: , Rfl:    pantoprazole (PROTONIX) 40 MG tablet, TAKE ONE TABLET EVERY DAY, Disp: 90 tablet, Rfl: 3   potassium chloride SA (KLOR-CON) 20 MEQ tablet, Take 1 tablet (20 mEq total) by  mouth daily., Disp: 30 tablet, Rfl: 0   rosuvastatin (CRESTOR) 40 MG tablet, Take 1 tablet (40 mg total) by mouth daily at 6 PM., Disp: 30 tablet, Rfl: 0   ursodiol (ACTIGALL) 300 MG capsule, Take 300 mg by mouth daily., Disp: , Rfl:    amoxicillin-clavulanate (AUGMENTIN) 500-125 MG tablet, SMARTSIG:1 Tablet(s) By Mouth Every 12 Hours, Disp: , Rfl:  There are no discontinued medications.  I have personally reviewed and addressed the Medicare Annual Wellness health risk assessment questionnaire and have noted the following in the patient's chart:  A.         Medical and social history & family history B.         Use of alcohol, tobacco or illicit drugs  C.         Current medications and supplements D.         Functional and Cognitive ability and status E.         Nutritional status F.         Physical activity G.        Advance directives H.         List of other physicians I.          Hospitalizations, surgeries, and ER visits in previous 12 months J.         [place] such as hearing and vision if needed, cognitive and depression L.         Referrals and appointments -   In addition, I have reviewed and discussed with patient certain preventive protocols, quality metrics, and best practice recommendations. A written personalized care plan for preventive services as well as general preventive health recommendations were provided to patient.   See attached scanned questionnaire for additional information.   Return in about 1 year (around 05/30/2023) for AWV.

## 2022-05-29 NOTE — Patient Instructions (Addendum)
[name] , Thank you for taking time to come for your Medicare Wellness Visit. I appreciate your ongoing commitment to your health goals. Please review the following plan we discussed and let me know if I can assist you in the future.   Screening recommendations/referrals: Colonoscopy: No longer indicated.  Recommended yearly ophthalmology/optometry visit for glaucoma screening and checkup Recommended yearly dental visit for hygiene and checkup  Vaccinations: Influenza vaccine: Recommended to repeat this every year in the Fall  Pneumococcal vaccine: Up to date. No further action required.  Tdap vaccine: Up to date  Shingles vaccine: Not completed. Patient declines    Covid-19: has not had a Bivalent booster. Recommend that he stay vigilant for new updates on recommendations.  Advanced directives: Please have him fill out a living will and update regularly.   Conditions/risks identified:   Next appointment: Follow up in one year for your annual wellness visit.   Preventive Care 31 Years and Older, Male  Preventive care refers to lifestyle choices and visits with your health care provider that can promote health and wellness. What does preventive care include? A yearly physical exam. This is also called an annual well check. Dental exams once or twice a year. Routine eye exams. Ask your health care provider how often you should have your eyes checked. Personal lifestyle choices, including: Daily care of your teeth and gums. Regular physical activity. Eating a healthy diet. Avoiding tobacco and drug use. Limiting alcohol use. Practicing safe sex. Taking low doses of aspirin every day. Taking vitamin and mineral supplements as recommended by your health care provider. What happens during an annual well check? The services and screenings done by your health care provider during your annual well check will depend on your age 86, overall health, lifestyle risk factors, and family history  of disease. Counseling  Your health care provider may ask you questions about your: Alcohol use. Tobacco use. Drug use. Emotional well-being. Home and relationship well-being. Sexual activity. Eating habits. History of falls. Memory and ability to understand (cognition). Work and work Statistician. Screening  You may have the following tests or measurements: Height, weight, and BMI. Blood pressure. Lipid and cholesterol levels. These may be checked every 5 years, or more frequently if you are over 86 years old. Skin check. Lung cancer screening. You may have this screening every year starting at age 86 if you have a 30-pack-year history of smoking and currently smoke or have quit within the past 15 years. Fecal occult blood test (FOBT) of the stool. You may have this test every year starting at age 86. Flexible sigmoidoscopy or colonoscopy. You may have a sigmoidoscopy every 5 years or a colonoscopy every 10 years starting at age 83. Prostate cancer screening. Recommendations will vary depending on your family history and other risks. Hepatitis C blood test. Hepatitis B blood test. Sexually transmitted disease (STD) testing. Diabetes screening. This is done by checking your blood sugar (glucose) after you have not eaten for a while (fasting). You may have this done every 1-3 years. Abdominal aortic aneurysm (AAA) screening. You may need this if you are a current or former smoker. Osteoporosis. You may be screened starting at age 86 if you are at high risk. Talk with your health care provider about your test results, treatment options, and if necessary, the need for more tests. Vaccines  Your health care provider may recommend certain vaccines, such as: Influenza vaccine. This is recommended every year. Tetanus, diphtheria, and acellular pertussis (Tdap, Td) vaccine.  You may need a Td booster every 10 years. Zoster vaccine. You may need this after age 86. Pneumococcal 13-valent  conjugate (PCV13) vaccine. One dose is recommended after age 86. Pneumococcal polysaccharide (PPSV23) vaccine. One dose is recommended after age 86. Talk to your health care provider about which screenings and vaccines you need and how often you need them. This information is not intended to replace advice given to you by your health care provider. Make sure you discuss any questions you have with your health care provider. Document Released: 12/14/2015 Document Revised: 08/06/2016 Document Reviewed: 09/18/2015 Elsevier Interactive Patient Education  2017 [place] Prevention in the Home Falls can cause injuries. They can happen to people of all ages. There are many things you can do to make your home safe and to help prevent falls. What can I do on the outside of my home? Regularly fix the edges of walkways and driveways and fix any cracks. Remove anything that might make you trip as you walk through a door, such as a raised step or threshold. Trim any bushes or trees on the path to your home. Use bright outdoor lighting. Clear any walking paths of anything that might make someone trip, such as rocks or tools. Regularly check to see if handrails are loose or broken. Make sure that both sides of any steps have handrails. Any raised decks and porches should have guardrails on the edges. Have any leaves, snow, or ice cleared regularly. Use sand or salt on walking paths during winter. Clean up any spills in your garage right away. This includes oil or grease spills. What can I do in the bathroom? Use night lights. Install grab bars by the toilet and in the tub and shower. Do not use towel bars as grab bars. Use non-skid mats or decals in the tub or shower. If you need to sit down in the shower, use a plastic, non-slip stool. Keep the floor dry. Clean up any water that spills on the floor as soon as it happens. Remove soap buildup in the tub or shower regularly. Attach bath mats  securely with double-sided non-slip rug tape. Do not have throw rugs and other things on the floor that can make you trip. What can I do in the bedroom? Use night lights. Make sure that you have a light by your bed that is easy to reach. Do not use any sheets or blankets that are too big for your bed. They should not hang down onto the floor. Have a firm chair that has side arms. You can use this for support while you get dressed. Do not have throw rugs and other things on the floor that can make you trip. What can I do in the kitchen? Clean up any spills right away. Avoid walking on wet floors. Keep items that you use a lot in easy-to-reach places. If you need to reach something above you, use a strong step stool that has a grab bar. Keep electrical cords out of the way. Do not use floor polish or wax that makes floors slippery. If you must use wax, use non-skid floor wax. Do not have throw rugs and other things on the floor that can make you trip. What can I do with my stairs? Do not leave any items on the stairs. Make sure that there are handrails on both sides of the stairs and use them. Fix handrails that are broken or loose. Make sure that handrails are as long as  the stairways. Check any carpeting to make sure that it is firmly attached to the stairs. Fix any carpet that is loose or worn. Avoid having throw rugs at the top or bottom of the stairs. If you do have throw rugs, attach them to the floor with carpet tape. Make sure that you have a light switch at the top of the stairs and the bottom of the stairs. If you do not have them, ask someone to add them for you. What else can I do to help prevent falls? Wear shoes that: Do not have high heels. Have rubber bottoms. Are comfortable and fit you well. Are closed at the toe. Do not wear sandals. If you use a stepladder: Make sure that it is fully opened. Do not climb a closed stepladder. Make sure that both sides of the stepladder  are locked into place. Ask someone to hold it for you, if possible. Clearly mark and make sure that you can see: Any grab bars or handrails. First and last steps. Where the edge of each step is. Use tools that help you move around (mobility aids) if they are needed. These include: Canes. Walkers. Scooters. Crutches. Turn on the lights when you go into a dark area. Replace any light bulbs as soon as they burn out. Set up your furniture so you have a clear path. Avoid moving your furniture around. If any of your floors are uneven, fix them. If there are any pets around you, be aware of where they are. Review your medicines with your doctor. Some medicines can make you feel dizzy. This can increase your chance of falling. Ask your doctor what other things that you can do to help prevent falls. This information is not intended to replace advice given to you by your health care provider. Make sure you discuss any questions you have with your health care provider. Document Released: 09/13/2009 Document Revised: 04/24/2016 Document Reviewed: 12/22/2014 Elsevier Interactive Patient Education  2017 [employer].

## 2022-05-29 NOTE — Telephone Encounter (Signed)
Copied from [place]. Topic: Appointment Scheduling - Scheduling Inquiry for Clinic >> May 29, 2022  1:05 PM Ja-Kwan M wrote: Reason for CRM: Pt daughter reports that pt has an appt scheduled for 1 pm for Medicare AWV by phone but they have not heard from anyone. Pt daughter requests that pt be contacted asap

## 2022-08-13 ENCOUNTER — Ambulatory Visit
Admission: RE | Admit: 2022-08-13 | Discharge: 2022-08-13 | Disposition: A | Discharge status: Home / Self Care | Payer: Medicare Other | Source: Ambulatory Visit | Attending: Radiation Oncology | Admitting: Radiation Oncology

## 2022-08-13 DIAGNOSIS — C3411 Malignant neoplasm of upper lobe, right bronchus or lung: Secondary | ICD-10-CM | POA: Insufficient documentation

## 2022-08-13 LAB — POCT I-STAT CREATININE: Creatinine, Ser: 1.6 mg/dL — ABNORMAL HIGH (ref 0.61–1.24)

## 2022-08-13 MED ORDER — IOHEXOL 300 MG/ML  SOLN
75.0000 mL | Freq: Once | INTRAMUSCULAR | Status: AC | PRN
  Administered 2022-08-13: 60 mL via INTRAVENOUS

## 2022-08-15 ENCOUNTER — Other Ambulatory Visit: Payer: Self-pay | Admitting: Family Medicine

## 2022-08-20 ENCOUNTER — Ambulatory Visit
Admission: RE | Admit: 2022-08-20 | Discharge: 2022-08-20 | Disposition: A | Discharge status: Home / Self Care | Payer: Medicare Other | Source: Ambulatory Visit | Attending: Radiation Oncology | Admitting: Radiation Oncology

## 2022-08-20 VITALS — BP 121/70 | HR 86 | Temp 97.7°F | Resp 18 | Ht 70.5 in | Wt 164.5 lb

## 2022-08-20 DIAGNOSIS — Z923 Personal history of irradiation: Secondary | ICD-10-CM | POA: Diagnosis not present

## 2022-08-20 DIAGNOSIS — Z85118 Personal history of other malignant neoplasm of bronchus and lung: Secondary | ICD-10-CM | POA: Insufficient documentation

## 2022-08-20 DIAGNOSIS — C3411 Malignant neoplasm of upper lobe, right bronchus or lung: Secondary | ICD-10-CM

## 2022-08-20 NOTE — Progress Notes (Signed)
Radiation Oncology Follow up Note  Name: AUTHOR HATLESTAD   Date:   08/20/2022 MRN:  [id number] DOB: [birth year]    This 88 y.o. male presents to the clinic today for 2-year follow-up status post SBRT to the posterior portion of the right upper lobe for stage I non-small cell lung cancer.  REFERRING PROVIDER: Steele Sizer, MD  HPI: Patient is a 88 year old male soon-to-be 89 who is now out 2 years having completed SBRT to the right upper lobe for stage I non-small cell lung cancer with SBRT.  Seen today in routine follow-up he is doing well specifically [place] cough any change in his pulmonary status or hemoptysis.  He recently had a CT scan.  Which is stable posttreatment scarring posterior right upper lobe unchanged.  COMPLICATIONS OF TREATMENT: none  FOLLOW UP COMPLIANCE: keeps appointments   PHYSICAL EXAM:  BP 121/70   Pulse 86   Temp 97.7 F (36.5 C)   Resp 18   Ht 5' 10.5" (1.791 m)   Wt 164 lb 8 oz (74.6 kg)   BMI 23.27 kg/m  Well-developed well-nourished patient in NAD. HEENT reveals PERLA, EOMI, discs not visualized.  Oral cavity is clear. No oral mucosal lesions are identified. Neck is clear without evidence of cervical or supraclavicular adenopathy. Lungs are clear to A&P. Cardiac examination is essentially unremarkable with regular rate and rhythm without murmur rub or thrill. Abdomen is benign with no organomegaly or masses noted. Motor sensory and DTR levels are equal and symmetric in the upper and lower extremities. Cranial nerves II through XII are grossly intact. Proprioception is intact. No peripheral adenopathy or edema is identified. No motor or sensory levels are noted. Crude visual fields are within normal range.  RADIOLOGY RESULTS: CT scans reviewed compatible with above-stated findings  PLAN: Present time patient continues to do well now out 2 years with no evidence of disease.  We will see him back in 6 months with a follow-up CT scan.  Should be unchanged we will  start once year follow-up visits with CT screening.  Patient and daughter both comprehend my treatment plan well.  I would like to take this opportunity to thank you for allowing me to participate in the care of your patient.Noreene Filbert, MD

## 2022-09-01 ENCOUNTER — Other Ambulatory Visit: Payer: Self-pay | Admitting: Family Medicine

## 2022-09-29 ENCOUNTER — Encounter (INDEPENDENT_AMBULATORY_CARE_PROVIDER_SITE_OTHER): Payer: Self-pay

## 2022-11-12 ENCOUNTER — Other Ambulatory Visit: Payer: Self-pay | Admitting: Family Medicine

## 2022-11-12 NOTE — Telephone Encounter (Signed)
Requested Prescriptions  Pending Prescriptions Disp Refills   pantoprazole (PROTONIX) 40 MG tablet [Pharmacy Med Name: PANTOPRAZOLE SODIUM 40 MG DR TAB] 90 tablet 0    Sig: TAKE ONE TABLET EVERY DAY     Gastroenterology: Proton Pump Inhibitors Passed - 11/12/2022  5:51 PM      Passed - Valid encounter within last 12 months    Recent Outpatient Visits           1 year ago Stage 3a chronic kidney disease [facility])   [facility] Steele Sizer, MD   1 year ago Cough   [facility] Steele Sizer, MD   1 year ago Moderate protein-calorie malnutrition [facility])   [facility] Steele Sizer, MD   2 years ago Moderate protein-calorie malnutrition [facility])   [facility] Steele Sizer, MD   2 years ago History of cholecystitis   [facility] Steele Sizer, MD       Future Appointments             In 3 weeks Ralene Bathe, MD [place]   In 7 months Steele Sizer, MD [facility], [facility]

## 2022-11-16 NOTE — Progress Notes (Unsigned)
MRN : [id number]  Jose Castro is a 88 y.o. ([birth year]) male who presents with chief complaint of check circulation.  History of Present Illness:  The patient returns to the office for followup and review of the noninvasive studies.   There have been no interval changes in lower extremity symptoms. No interval shortening of the patient's claudication distance or development of rest pain symptoms. No new ulcers or wounds have occurred since the last visit.  The patient is followed for leg right lower extremity numbness.  The original complaint was of numbness in his right foot.  The patient notes that the numbness happens more so at night and it is in his toes and foot area.  He notes that this pain comes and goes.  He notes that if he stands or walks or moves his foot around the pain tends to go away.  This pain does not happen consistently.  He denies any claudication or rest pain like symptoms.  The patient has had a previous history of a spinal fusion about 7 to 8 years ago.    The patient denies amaurosis fugax or recent TIA symptoms. There are no documented recent neurological changes noted.  There is no history of DVT, PE or superficial thrombophlebitis. The patient denies recent episodes of angina or shortness of breath.   He recently saw Dr Donella Stade in September and is s/p radiation recently for a nodule in his lung.  He denies any fever, chills, nausea or vomiting.  He denies any hemoptysis.  Follow up scans are stable   ABI's Rt=1.06 and Lt=0.66 (previous ABI Rt=1.25 and Lt=1.25).  Carotid duplex today shows RICA Occluded (known) and LICA 6-73% (previous carotid duplex done at [facility] dated 05/22/2019 showed RICA occlusion and LICA 4-19%)   No outpatient medications have been marked as taking for the 11/17/22 encounter (Appointment) with Delana Meyer, Dolores Lory, MD.    Past Medical History:  Diagnosis Date   Allergy    Penicillin   Arthritis    Basal cell  carcinoma    BPH (benign prostatic hyperplasia)    Chronic kidney disease    had a kidney stone which per family was a cyst that was removed   Dysrhythmia    GERD (gastroesophageal reflux disease)    History of kidney stones    Hyperlipidemia    Hypertension    Left-sided low back pain with left-sided sciatica    Lung mass    Splenic vein thrombosis    Stroke ([place]) 05/22/2019   Thrombosis 12/2015   mural  area and no notation of heart attack    Past Surgical History:  Procedure Laterality Date   APPENDECTOMY     CATARACT EXTRACTION, BILATERAL     CYSTOSCOPY WITH INSERTION OF UROLIFT     ENDOSCOPIC RETROGRADE CHOLANGIOPANCREATOGRAPHY (ERCP) WITH PROPOFOL N/A 05/11/2020   Procedure: ENDOSCOPIC RETROGRADE CHOLANGIOPANCREATOGRAPHY (ERCP) WITH PROPOFOL;  Surgeon: [name], MD;  Location: [facility] ENDOSCOPY;  Service: Endoscopy;  Laterality: N/A;   ERCP N/A 10/23/2020   Procedure: ENDOSCOPIC RETROGRADE CHOLANGIOPANCREATOGRAPHY (ERCP);  Surgeon: [name], MD;  Location: [facility] ENDOSCOPY;  Service: Endoscopy;  Laterality: N/A;   EYE SURGERY  08/2018   IR EXCHANGE BILIARY DRAIN  05/04/2020   KIDNEY STONE SURGERY     KNEE SURGERY     PACEMAKER INSERTION N/A 07/13/2018   Procedure: INSERTION PACEMAKER-DUEL CHAMBER INITIAL IMPLANT;  Surgeon: Isaias Cowman, MD;  Location:  [facility] ORS;  Service: Cardiovascular;  Laterality: N/A;   SPINE SURGERY      Social History Social History   Tobacco Use   Smoking status: Former    Packs/day: 0.00    Years: 16.00    Total pack years: 0.00    Types: Cigarettes    Start date: 10/21/1944    Quit date: 10/21/1960    Years since quitting: 62.1   Smokeless tobacco: Never  Vaping Use   Vaping Use: Never used  Substance Use Topics   Alcohol use: Yes    Alcohol/week: 3.0 standard drinks of alcohol    Types: 3 Standard drinks or equivalent per week    Comment: once a week when he goes out to dinner-socially   Drug use: No    Family  History Family History  Problem Relation Age of Onset   Cancer Brother        bladder cancer with mets   Heart disease Mother    Aortic aneurysm Mother    Heart attack Maternal Aunt    Heart attack Maternal Uncle     Allergies  Allergen Reactions   Penicillins Itching and Swelling    Pt reported arm rash and swelling with penicillin administration while he was in the [employer].  However, pt was able to tolerate IV Unasyn without any problem, so penicillin allergy unlikely to be real or serious.  05/14/2020.     REVIEW OF SYSTEMS (Negative unless checked)  Constitutional: [] Weight loss  [] Fever  [] Chills Cardiac: [] Chest pain   [] Chest pressure   [] Palpitations   [] Shortness of breath when laying flat   [] Shortness of breath with exertion. Vascular:  [x] Pain in legs with walking   [] Pain in legs at rest  [] History of DVT   [] Phlebitis   [] Swelling in legs   [] Varicose veins   [] Non-healing ulcers Pulmonary:   [] Uses home oxygen   [] Productive cough   [] Hemoptysis   [] Wheeze  [] COPD   [] Asthma Neurologic:  [] Dizziness   [] Seizures   [] History of stroke   [] History of TIA  [] Aphasia   [] Vissual changes   [] Weakness or numbness in arm   [] Weakness or numbness in leg Musculoskeletal:   [] Joint swelling   [] Joint pain   [] Low back pain Hematologic:  [] Easy bruising  [] Easy bleeding   [] Hypercoagulable state   [] Anemic Gastrointestinal:  [] Diarrhea   [] Vomiting  [] Gastroesophageal reflux/heartburn   [] Difficulty swallowing. Genitourinary:  [] Chronic kidney disease   [] Difficult urination  [] Frequent urination   [] Blood in urine Skin:  [] Rashes   [] Ulcers  Psychological:  [] History of anxiety   []  History of major depression.  Physical Examination  There were no vitals filed for this visit. There is no height or weight on file to calculate BMI. Gen: WD/WN, NAD Head: [place]/AT, No temporalis wasting.  Ear/Nose/Throat: Hearing grossly intact, nares w/o erythema or drainage Eyes: PER, EOMI,  sclera nonicteric.  Neck: Supple, no masses.  No bruit or JVD.  Pulmonary:  Good air movement, no audible wheezing, no use of accessory muscles.  Cardiac: RRR, normal S1, S2, no Murmurs. Vascular:  mild trophic changes, no open wounds Vessel Right Left  Radial Palpable Palpable  PT Not Palpable Not Palpable  DP Not Palpable Not Palpable  Gastrointestinal: soft, non-distended. No guarding/no peritoneal signs.  Musculoskeletal: M/S 5/5 throughout.  No visible deformity.  Neurologic: CN 2-12 intact. Pain and light touch intact in extremities.  Symmetrical.  Speech is fluent. Motor exam as listed above. Psychiatric: Judgment  intact, Mood & affect appropriate for pt's clinical situation. Dermatologic: No rashes or ulcers noted.  No changes consistent with cellulitis.   CBC Lab Results  Component Value Date   WBC 9.0 08/16/2021   HGB 13.2 08/16/2021   HCT 41.1 08/16/2021   MCV 92.6 08/16/2021   PLT 329 08/16/2021    BMET    Component Value Date/Time   NA 140 09/19/2021 1506   NA 144 11/19/2016 0955   NA 138 11/27/2013 0819   K 4.7 09/19/2021 1506   K 4.1 11/27/2013 0819   CL 105 09/19/2021 1506   CL 107 11/27/2013 0819   CO2 27 09/19/2021 1506   CO2 23 11/27/2013 0819   GLUCOSE 78 09/19/2021 1506   GLUCOSE 101 (H) 11/27/2013 0819   BUN 23 09/19/2021 1506   BUN 25 11/19/2016 0955   BUN 22 (H) 11/27/2013 0819   CREATININE 1.60 (H) 08/13/2022 0953   CREATININE 1.43 (H) 09/19/2021 1506   CALCIUM 9.4 09/19/2021 1506   CALCIUM 8.2 (L) 11/27/2013 0819   GFRNONAA 54 (L) 06/25/2021 1017   GFRNONAA 53 (L) 03/11/2021 1419   GFRAA 62 03/11/2021 1419   CrCl cannot be calculated (Patient's most recent lab result is older than the maximum 21 days allowed.).  COAG Lab Results  Component Value Date   INR 2.0 (H) 05/02/2020   INR 1.1 05/21/2019   INR 1.04 07/06/2018    Radiology No results found.   Assessment/Plan 1. Atherosclerosis of native artery of both lower extremities  with intermittent claudication (HCC)  Recommend:  The patient has evidence of atherosclerosis of the lower extremities with claudication.  The patient does not voice lifestyle limiting changes at this point in time.  Noninvasive studies do not suggest clinically significant change on the right , slight decrease on the left (but this is asymptomatic).  No invasive studies, angiography or surgery at this time The patient should continue walking and begin a more formal exercise program.  The patient should continue antiplatelet therapy and aggressive treatment of the lipid abnormalities  No changes in the patient's medications at this time  Continued surveillance is indicated as atherosclerosis is likely to progress with time.    The patient will continue follow up with noninvasive studies as ordered.  - VAS [place] ABI WITH/WO TBI; Future  2. Chronic venous insufficiency No surgery or intervention at this point in time.   The patient is CEAP [id number]   I have discussed with the patient venous insufficiency and why it  causes symptoms. I have discussed with the patient the chronic skin changes that accompany venous insufficiency and the long term sequela such as infection and ulceration.  Patient will begin wearing graduated compression stockings or compression wraps on a daily basis.  The patient will put the compression on first thing in the morning and removing them in the evening. The patient is instructed specifically not to sleep in the compression.    In addition, behavioral modification including several periods of elevation of the lower extremities during the day will be continued. I have demonstrated that proper elevation is a position with the ankles at heart level.  The patient is instructed to begin routine exercise, especially walking on a daily basis  The patient will be assessed for a Lymph Pump depending on the effectiveness of conservative therapy and the control of the  associated lymphedema.  3. Bilateral carotid artery stenosis Recommend:  Given the patient's asymptomatic subcritical stenosis no further invasive testing or surgery  at this time.  Duplex ultrasound shows RICA occlusion and LICA 5-28% stenosis.  Continue antiplatelet therapy as prescribed Continue management of CAD, HTN and Hyperlipidemia Healthy heart diet,  encouraged exercise at least 4 times per week Follow up in 24 months with duplex ultrasound and physical exam   4. Essential hypertension Continue antihypertensive medications as already ordered, these medications have been reviewed and there are no changes at this time.  5. Chronic atrial fibrillation (HCC) Continue antiarrhythmia medications as already ordered, these medications have been reviewed and there are no changes at this time.  Continue anticoagulation as ordered by Cardiology Service  6. Mixed hyperlipidemia Continue statin as ordered and reviewed, no changes at this time    Hortencia Pilar, MD  11/16/2022 3:21 PM

## 2022-11-17 ENCOUNTER — Encounter (INDEPENDENT_AMBULATORY_CARE_PROVIDER_SITE_OTHER): Payer: Self-pay | Admitting: Vascular Surgery

## 2022-11-17 ENCOUNTER — Ambulatory Visit (INDEPENDENT_AMBULATORY_CARE_PROVIDER_SITE_OTHER): Payer: Medicare Other

## 2022-11-17 ENCOUNTER — Ambulatory Visit (INDEPENDENT_AMBULATORY_CARE_PROVIDER_SITE_OTHER): Payer: Medicare Other | Admitting: Vascular Surgery

## 2022-11-17 VITALS — BP 130/82 | HR 81 | Resp 16 | Wt 170.8 lb

## 2022-11-17 DIAGNOSIS — I872 Venous insufficiency (chronic) (peripheral): Secondary | ICD-10-CM

## 2022-11-17 DIAGNOSIS — I6523 Occlusion and stenosis of bilateral carotid arteries: Secondary | ICD-10-CM

## 2022-11-17 DIAGNOSIS — I70213 Atherosclerosis of native arteries of extremities with intermittent claudication, bilateral legs: Secondary | ICD-10-CM

## 2022-11-17 DIAGNOSIS — I10 Essential (primary) hypertension: Secondary | ICD-10-CM

## 2022-11-17 DIAGNOSIS — E782 Mixed hyperlipidemia: Secondary | ICD-10-CM

## 2022-11-17 DIAGNOSIS — I482 Chronic atrial fibrillation, unspecified: Secondary | ICD-10-CM

## 2022-11-18 ENCOUNTER — Encounter (INDEPENDENT_AMBULATORY_CARE_PROVIDER_SITE_OTHER): Payer: Self-pay | Admitting: Vascular Surgery

## 2022-12-08 ENCOUNTER — Ambulatory Visit: Payer: Medicare Other | Admitting: Dermatology

## 2023-02-11 ENCOUNTER — Other Ambulatory Visit: Payer: Self-pay | Admitting: Family Medicine

## 2023-02-12 NOTE — Telephone Encounter (Signed)
Pt is scheduled for Monday

## 2023-02-13 NOTE — Progress Notes (Unsigned)
Name: Jose Castro   MRN: [id number]    DOB: 1926/11/13   Date:02/16/2023       Progress Note  Subjective  Chief Complaint  Follow Up  HPI  Percutaneous cholecystostomy : he was admitted on 05/02/2020 and stayed until June 14 th 2021 ,he had a percutaneous cholecystotomy placed but doing well. He had it reversed and he takes Ursodiol once a day now and doing well, no nausea or vomiting, appetite is good   Non small Cell Carcinoma: diagnosed Summer 2022  he is seeing Dr. Arthor Captain and Dr. Donella Stade, he is on surveillance now, needs repeat CT soon   Senile purpura: both arms, on Eliquis, stable and reassurance given   History of prostate cancer: s/p brachytherapy , he has nocturia but stable.   History of CVA/ Chronic Afib : right MCA distribution infarct on 05/22/2019 likely due to Afib. He has been taking Eliquis given by Dr. Clayborn Bigness and also statin therapy. He continues to have some left side weakness and uses a walker for hemiparesis and also Oa knee   Hyperlipidemia/atherosclerosis of Aorta : goal LDL is below 70, we will recheck labs today , he lost to follow up.   Aneurysm of thoracic aorta: on statin and bp control, he will have repeat CT chest soon, last it was 4.1 cm    GERD: he is on PPI and symptoms have been controlled, we will try cutting down to 20 mg daily    PAD history of claudication : he is wearing compression stocking hoses, seen by vascular surgeon , he on statin and eliquis, denies claudication but he does not walk much now, only using walker   CKI stage III: he has good urine output, no pruritus, we will recheck labs, he is not on ARB or SBL-2 agonist, we will recheck level  OA left knee: pain and instability, he has seen Dr. Sabra Heck, currently taking Tylenol Pm, must change to Tylenol only if he has problems sleeping we can try Belsomra   Patient Active Problem List   Diagnosis Date Noted   Rest pain of both lower extremities due to atherosclerosis ([place])  02/16/2023   Degenerative disease of nervous system, unspecified ([place]) 02/16/2023   Senile purpura ([place]) 02/16/2023   Atherosclerosis of native arteries of extremity with intermittent claudication ([place]) 11/25/2021   Localized osteoarthritis of knees, bilateral 06/13/2021   Moderate protein-calorie malnutrition ([place]) 03/11/2021   Hemiparesis affecting left side as late effect of cerebrovascular accident (CVA) ([place]) 03/11/2021   Aneurysm, aorta, thoracic ([place]) 12/21/2020   Calculus of bile duct without cholecystitis and without obstruction    Non-small cell carcinoma of lung, right ([place]) 09/07/2020   Mass of upper lobe of right lung 07/13/2020   Calculus of bile duct with cholecystitis without obstruction    Abnormal findings on imaging of biliary tract    Acute cholecystitis 05/02/2020   Chronic venous insufficiency 12/05/2019   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    CKD (chronic kidney disease), stage II    Atrial fibrillation ([place])    Right middle cerebral artery stroke ([place]) 05/27/2019   Goals of care, counseling/discussion    Palliative care by specialist    DNR (do not resuscitate) discussion    History of CVA (cerebrovascular accident) 05/22/2019   Persistent proteinuria 07/30/2018   Mobitz type 2 second degree heart block 07/13/2018   Chronic kidney disease, stage III (moderate) ([place]) 06/15/2018   Leg pain 05/06/2018   Lymphedema 05/06/2018  Coagulopathy ([place]) 07/07/2017   Lumbar spondylosis 03/10/2017   Elevated uric acid in blood 11/20/2016   BPH (benign prostatic hyperplasia) 05/29/2016   Peripheral vascular disease of lower extremity ([place]) 03/03/2016   Gallstone 12/31/2015   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   Atherosclerosis of aorta ([place]) 11/22/2015   Carotid artery narrowing 11/22/2015   Diverticulosis of colon 11/22/2015   Decreased creatinine clearance 11/22/2015   Arthritis, degenerative 11/22/2015   Lactose intolerance  11/22/2015   Basal cell carcinoma 11/22/2015   Essential hypertension 06/21/2015   Hyperlipemia 06/21/2015   GERD (gastroesophageal reflux disease) 06/21/2015   Calculus of kidney 11/18/2013    Past Surgical History:  Procedure Laterality Date   APPENDECTOMY     CATARACT EXTRACTION, BILATERAL     CYSTOSCOPY WITH INSERTION OF UROLIFT     ENDOSCOPIC RETROGRADE CHOLANGIOPANCREATOGRAPHY (ERCP) WITH PROPOFOL N/A 05/11/2020   Procedure: ENDOSCOPIC RETROGRADE CHOLANGIOPANCREATOGRAPHY (ERCP) WITH PROPOFOL;  Surgeon: [name], MD;  Location: [facility] ENDOSCOPY;  Service: Endoscopy;  Laterality: N/A;   ERCP N/A 10/23/2020   Procedure: ENDOSCOPIC RETROGRADE CHOLANGIOPANCREATOGRAPHY (ERCP);  Surgeon: [name], MD;  Location: [facility] ENDOSCOPY;  Service: Endoscopy;  Laterality: N/A;   EYE SURGERY  08/2018   IR EXCHANGE BILIARY DRAIN  05/04/2020   KIDNEY STONE SURGERY     KNEE SURGERY     PACEMAKER INSERTION N/A 07/13/2018   Procedure: INSERTION PACEMAKER-DUEL CHAMBER INITIAL IMPLANT;  Surgeon: Isaias Cowman, MD;  Location: [facility] ORS;  Service: Cardiovascular;  Laterality: N/A;   SPINE SURGERY      Family History  Problem Relation Age of Onset   Cancer Brother        bladder cancer with mets   Heart disease Mother    Aortic aneurysm Mother    Heart attack Maternal Aunt    Heart attack Maternal Uncle     Social History   Tobacco Use   Smoking status: Former    Packs/day: 0.00    Years: 16.00    Additional pack years: 0.00    Total pack years: 0.00    Types: Cigarettes    Start date: 10/21/1944    Quit date: 10/21/1960    Years since quitting: 62.3   Smokeless tobacco: Never  Substance Use Topics   Alcohol use: Yes    Alcohol/week: 3.0 standard drinks of alcohol    Types: 3 Standard drinks or equivalent per week    Comment: once a week when he goes out to dinner-socially     Current Outpatient Medications:    acetaminophen (TYLENOL) 325 MG tablet, Take 2 tablets (650 mg  total) by mouth every 4 (four) hours as needed for mild pain (or temp > 37.5 C (99.5 F))., Disp:  , Rfl:    apixaban (ELIQUIS) 2.5 MG TABS tablet, Take 1 tablet (2.5 mg total) by mouth 2 (two) times daily., Disp: 60 tablet, Rfl: 0   Cholecalciferol (VITAMIN D3) 50 MCG (2000 UT) TABS, Take 2,000 Units by mouth daily., Disp: 30 tablet, Rfl: 0   Ensure Max Protein (ENSURE MAX PROTEIN) LIQD, Take 330 mLs (11 oz total) by mouth 2 (two) times daily., Disp: , Rfl:    metoprolol succinate (TOPROL XL) 25 MG 24 hr tablet, Take 1 tablet (25 mg total) by mouth daily., Disp: 30 tablet, Rfl: 0   Multiple Vitamins-Minerals (ZINC PO), Take 500 mg by mouth 2 (two) times daily., Disp: , Rfl:    Omega-3 Fatty Acids (FISH OIL) 1000 MG CAPS, Take 1,000 mg  by mouth daily. , Disp: , Rfl:    pantoprazole (PROTONIX) 40 MG tablet, TAKE ONE TABLET EVERY DAY, Disp: 30 tablet, Rfl: 0   rosuvastatin (CRESTOR) 40 MG tablet, Take 1 tablet (40 mg total) by mouth daily at 6 PM., Disp: 30 tablet, Rfl: 0   ursodiol (ACTIGALL) 300 MG capsule, TAKE 1 CAPSULE (300 MG) BY MOUTH TWICE DAILY, Disp: 60 capsule, Rfl: 0  Allergies  Allergen Reactions   Penicillins Itching and Swelling    Pt reported arm rash and swelling with penicillin administration while he was in the [employer].  However, pt was able to tolerate IV Unasyn without any problem, so penicillin allergy unlikely to be real or serious.  05/14/2020.    I personally reviewed active problem list, medication list, allergies, family history, social history, health maintenance with the patient/caregiver today.   ROS  Constitutional: Negative for fever , positive for weight change.  Respiratory: Negative for cough and shortness of breath.   Cardiovascular: Negative for chest pain or palpitations.  Gastrointestinal: Negative for abdominal pain, no bowel changes.  Musculoskeletal: Negative for gait problem or joint swelling.  Skin: Negative for rash.  Neurological: Negative for  dizziness or headache.  No other specific complaints in a complete review of systems (except as listed in HPI above).   Objective  Vitals:   02/16/23 1421  BP: 116/72  Pulse: 90  Resp: 18  Temp: 98 F (36.7 C)  TempSrc: Oral  SpO2: 94%  Weight: 173 lb 9.6 oz (78.7 kg)  Height: 5' 10.5" (1.791 m)    Body mass index is 24.56 kg/m.  Physical Exam  Constitutional: Patient appears well-developed and well-nourished.  No distress.  HEENT: head atraumatic, normocephalic, pupils equal and reactive to light, ears left ear  lob has a small ulceration - personal history of skin cancer ( daughter will contact his dermatologist if no resolution in the next couple of weeks) , neck supple, throat within normal limits Cardiovascular: Normal rate, regular rhythm and normal heart sounds.  No murmur heard. No BLE edema. Pulmonary/Chest: Effort normal and breath sounds normal. No respiratory distress. Abdominal: Soft.  There is no tenderness. Muscular skeletal: using walker, getting weak again  Psychiatric: Patient has a normal mood and affect. behavior is normal. Judgment and thought content normal.    PHQ2/9:    02/16/2023    2:23 PM 05/29/2022   11:57 AM 09/10/2021   12:57 PM 08/16/2021   10:55 AM 03/28/2021    1:32 PM  Depression screen PHQ 2/9  Decreased Interest 0 0 0 0 0  Down, Depressed, Hopeless 0 0 0 0 0  PHQ - 2 Score 0 0 0 0 0  Altered sleeping 0 0     Tired, decreased energy 0 0     Change in appetite 0 0     Feeling bad or failure about yourself  0 0     Trouble concentrating 0 0     Moving slowly or fidgety/restless 0 0     Suicidal thoughts 0 0     PHQ-9 Score 0 0     Difficult doing work/chores  Not difficult at all       phq 9 is negative   Fall Risk:    02/16/2023    2:23 PM 05/29/2022   11:57 AM 09/10/2021   12:57 PM 08/16/2021   10:55 AM 03/28/2021    1:34 PM  Fall Risk   Falls in the past year? 0 0 0 1  0  Number falls in past yr:  0 0 0 0  Injury with  Fall?  0 0 0 0  Risk for fall due to : Impaired balance/gait No Fall Risks Impaired balance/gait Impaired balance/gait;Impaired mobility Impaired balance/gait  Follow up Falls prevention discussed;Education provided;Falls evaluation completed Falls prevention discussed;Education provided Falls prevention discussed Falls prevention discussed Falls prevention discussed      Functional Status Survey: Is the patient deaf or have difficulty hearing?: Yes Does the patient have difficulty seeing, even when wearing glasses/contacts?: No Does the patient have difficulty concentrating, remembering, or making decisions?: Yes Does the patient have difficulty walking or climbing stairs?: Yes Does the patient have difficulty dressing or bathing?: No Does the patient have difficulty doing errands alone such as visiting a doctor's office or shopping?: Yes    Assessment & Plan  1. Hemiparesis affecting left side as late effect of cerebrovascular accident (CVA) ([place])  - Lipid panel - Ambulatory referral to [place]  2. Atherosclerosis of aorta (HCC)  - Lipid panel  3. Atherosclerosis of native artery of both lower extremities with intermittent claudication ([place])  On statin therapy   4. Aneurysm of ascending aorta without rupture [facility])  He sees a cardiologist   5. Chronic atrial fibrillation (HCC)  Rate controlled   6. Senile purpura (HCC)  Stable  7. Stage 3a chronic kidney disease (HCC)  - CBC with Differential/Platelet - COMPLETE METABOLIC PANEL WITH GFR - VITAMIN D 25 Hydroxy (Vit-D Deficiency, Fractures)  8. Non-small cell carcinoma of lung, right (HCC)  Under the care of Dr. Donella Stade   9. Low serum vitamin B12  - B12 and Folate Panel  10. History of cholecystitis  - ursodiol (ACTIGALL) 300 MG capsule; Take 1 capsule (300 mg total) by mouth daily.  Dispense: 90 capsule; Refill: 1  11. Gait instability  - Ambulatory referral to [place]. Gastroesophageal  reflux disease without esophagitis  - pantoprazole (PROTONIX) 20 MG tablet; Take 1 tablet (20 mg total) by mouth daily.  Dispense: 90 tablet; Refill: 0  13. History of recent fall  - Ambulatory referral to [place]    15. Lesion of left external ear  Daughter will contact dermatologist if no resolution in 2 weeks

## 2023-02-16 ENCOUNTER — Encounter: Payer: Self-pay | Admitting: Family Medicine

## 2023-02-16 ENCOUNTER — Ambulatory Visit: Payer: Medicare Other | Admitting: Family Medicine

## 2023-02-16 VITALS — BP 116/72 | HR 90 | Temp 98.0°F | Resp 18 | Ht 70.5 in | Wt 173.6 lb

## 2023-02-16 DIAGNOSIS — H6192 Disorder of left external ear, unspecified: Secondary | ICD-10-CM

## 2023-02-16 DIAGNOSIS — I700 Atherosclerosis of aorta: Secondary | ICD-10-CM | POA: Diagnosis not present

## 2023-02-16 DIAGNOSIS — Z23 Encounter for immunization: Secondary | ICD-10-CM | POA: Diagnosis not present

## 2023-02-16 DIAGNOSIS — I69354 Hemiplegia and hemiparesis following cerebral infarction affecting left non-dominant side: Secondary | ICD-10-CM | POA: Diagnosis not present

## 2023-02-16 DIAGNOSIS — N1831 Chronic kidney disease, stage 3a: Secondary | ICD-10-CM

## 2023-02-16 DIAGNOSIS — C3491 Malignant neoplasm of unspecified part of right bronchus or lung: Secondary | ICD-10-CM

## 2023-02-16 DIAGNOSIS — K219 Gastro-esophageal reflux disease without esophagitis: Secondary | ICD-10-CM

## 2023-02-16 DIAGNOSIS — I70223 Atherosclerosis of native arteries of extremities with rest pain, bilateral legs: Secondary | ICD-10-CM | POA: Insufficient documentation

## 2023-02-16 DIAGNOSIS — I70213 Atherosclerosis of native arteries of extremities with intermittent claudication, bilateral legs: Secondary | ICD-10-CM

## 2023-02-16 DIAGNOSIS — R2681 Unsteadiness on feet: Secondary | ICD-10-CM | POA: Insufficient documentation

## 2023-02-16 DIAGNOSIS — I482 Chronic atrial fibrillation, unspecified: Secondary | ICD-10-CM

## 2023-02-16 DIAGNOSIS — Z9181 History of falling: Secondary | ICD-10-CM

## 2023-02-16 DIAGNOSIS — I7121 Aneurysm of the ascending aorta, without rupture: Secondary | ICD-10-CM | POA: Diagnosis not present

## 2023-02-16 DIAGNOSIS — E538 Deficiency of other specified B group vitamins: Secondary | ICD-10-CM

## 2023-02-16 DIAGNOSIS — D692 Other nonthrombocytopenic purpura: Secondary | ICD-10-CM

## 2023-02-16 DIAGNOSIS — G319 Degenerative disease of nervous system, unspecified: Secondary | ICD-10-CM | POA: Insufficient documentation

## 2023-02-16 DIAGNOSIS — Z8719 Personal history of other diseases of the digestive system: Secondary | ICD-10-CM

## 2023-02-16 HISTORY — DX: Personal history of other diseases of the digestive system: Z87.19

## 2023-02-16 MED ORDER — PANTOPRAZOLE SODIUM 20 MG PO TBEC: 20.0000 mg | DELAYED_RELEASE_TABLET | Freq: Every day | ORAL | 0 refills | Status: DC

## 2023-02-16 MED ORDER — URSODIOL 300 MG PO CAPS: 300.0000 mg | ORAL_CAPSULE | Freq: Every day | ORAL | 1 refills | Status: DC

## 2023-02-16 NOTE — Patient Instructions (Addendum)
Please get extra urinals to be used during the night. He should not be leaving his bed during night time, it increases the risk of falls  Go to local pharmacy for Tdap and Shingrix vaccines .

## 2023-02-17 LAB — CBC WITH DIFFERENTIAL/PLATELET
Absolute Monocytes: 1045 cells/uL — ABNORMAL HIGH (ref 200–950)
Basophils Absolute: 89 cells/uL (ref 0–200)
Basophils Relative: 1.1 %
Eosinophils Absolute: 389 cells/uL (ref 15–500)
Eosinophils Relative: 4.8 %
HCT: 43.3 % (ref 38.5–50.0)
Hemoglobin: 14.5 g/dL (ref 13.2–17.1)
Lymphs Abs: 2300 cells/uL (ref 850–3900)
MCH: 31.7 pg (ref 27.0–33.0)
MCHC: 33.5 g/dL (ref 32.0–36.0)
MCV: 94.5 fL (ref 80.0–100.0)
MPV: 11.3 fL (ref 7.5–12.5)
Monocytes Relative: 12.9 %
Neutro Abs: 4277 cells/uL (ref 1500–7800)
Neutrophils Relative %: 52.8 %
Platelets: 251 10*3/uL (ref 140–400)
RBC: 4.58 10*6/uL (ref 4.20–5.80)
RDW: 11.8 % (ref 11.0–15.0)
Total Lymphocyte: 28.4 %
WBC: 8.1 10*3/uL (ref 3.8–10.8)

## 2023-02-17 LAB — COMPLETE METABOLIC PANEL WITH GFR
AG Ratio: 1.7 (calc) (ref 1.0–2.5)
ALT: 7 U/L — ABNORMAL LOW (ref 9–46)
AST: 14 U/L (ref 10–35)
Albumin: 4 g/dL (ref 3.6–5.1)
Alkaline phosphatase (APISO): 59 U/L (ref 35–144)
BUN/Creatinine Ratio: 20 (calc) (ref 6–22)
BUN: 25 mg/dL (ref 7–25)
CO2: 28 mmol/L (ref 20–32)
Calcium: 9.5 mg/dL (ref 8.6–10.3)
Chloride: 104 mmol/L (ref 98–110)
Creat: 1.25 mg/dL — ABNORMAL HIGH (ref 0.70–1.22)
Globulin: 2.4 g/dL (calc) (ref 1.9–3.7)
Glucose, Bld: 90 mg/dL (ref 65–99)
Potassium: 5.2 mmol/L (ref 3.5–5.3)
Sodium: 140 mmol/L (ref 135–146)
Total Bilirubin: 0.9 mg/dL (ref 0.2–1.2)
Total Protein: 6.4 g/dL (ref 6.1–8.1)
eGFR: 53 mL/min/{1.73_m2} — ABNORMAL LOW (ref >=60)

## 2023-02-17 LAB — LIPID PANEL
Cholesterol: 91 mg/dL (ref <200)
HDL: 30 mg/dL — ABNORMAL LOW (ref >=40)
LDL Cholesterol (Calc): 41 mg/dL (calc)
Non-HDL Cholesterol (Calc): 61 mg/dL (calc) (ref <130)
Total CHOL/HDL Ratio: 3 (calc) (ref <5.0)
Triglycerides: 121 mg/dL (ref <150)

## 2023-02-17 LAB — B12 AND FOLATE PANEL
Folate: 18.3 ng/mL
Vitamin B-12: 273 pg/mL (ref 200–1100)

## 2023-02-17 LAB — VITAMIN D 25 HYDROXY (VIT D DEFICIENCY, FRACTURES): Vit D, 25-Hydroxy: 67 ng/mL (ref 30–100)

## 2023-02-19 ENCOUNTER — Ambulatory Visit
Admission: RE | Admit: 2023-02-19 | Discharge: 2023-02-19 | Disposition: A | Discharge status: Home / Self Care | Payer: Medicare Other | Source: Ambulatory Visit | Attending: Radiation Oncology | Admitting: Radiation Oncology

## 2023-02-19 DIAGNOSIS — C3411 Malignant neoplasm of upper lobe, right bronchus or lung: Secondary | ICD-10-CM | POA: Insufficient documentation

## 2023-02-19 MED ORDER — IOHEXOL 300 MG/ML  SOLN
60.0000 mL | Freq: Once | INTRAMUSCULAR | Status: AC | PRN
  Administered 2023-02-19: 60 mL via INTRAVENOUS

## 2023-02-26 ENCOUNTER — Ambulatory Visit
Admission: RE | Admit: 2023-02-26 | Discharge: 2023-02-26 | Disposition: A | Discharge status: Home / Self Care | Payer: Medicare Other | Source: Ambulatory Visit | Attending: Radiation Oncology | Admitting: Radiation Oncology

## 2023-02-26 ENCOUNTER — Encounter: Payer: Self-pay | Admitting: Radiation Oncology

## 2023-02-26 VITALS — BP 125/67 | HR 85 | Temp 98.2°F | Resp 20 | Wt 174.0 lb

## 2023-02-26 DIAGNOSIS — I7121 Aneurysm of the ascending aorta, without rupture: Secondary | ICD-10-CM | POA: Insufficient documentation

## 2023-02-26 DIAGNOSIS — Z923 Personal history of irradiation: Secondary | ICD-10-CM | POA: Diagnosis not present

## 2023-02-26 DIAGNOSIS — C3411 Malignant neoplasm of upper lobe, right bronchus or lung: Secondary | ICD-10-CM

## 2023-02-26 DIAGNOSIS — Z85118 Personal history of other malignant neoplasm of bronchus and lung: Secondary | ICD-10-CM | POA: Diagnosis not present

## 2023-02-26 NOTE — Progress Notes (Signed)
Radiation Oncology Follow up Note  Name: Jose Castro   Date:   02/26/2023 MRN:  [id number] DOB: 03/19/1926    This 89 y.o. male presents to the clinic today for 2-1/2-year follow-up status post SBRT to the posterior portion of the right upper lobe for stage I non-small cell lung cancer.  REFERRING PROVIDER: Steele Sizer, MD  HPI: Patient is a 89 year old male now out over 2-1/2 years having completed SBRT to his right upper lobe for stage I non-small cell lung cancer.  Seen today in routine follow-up he is doing well specifically [place] cough hemoptysis or any change in his pulmonary status..  He had a recent CT scan which I have reviewed shows stable exam no new or progressive findings.  He has a 4 cm ascending thoracic aortic aneurysm which is being followed by vascular surgery.  COMPLICATIONS OF TREATMENT: none  FOLLOW UP COMPLIANCE: keeps appointments   PHYSICAL EXAM:  BP 125/67   Pulse 85   Temp 98.2 F (36.8 C) (Tympanic)   Resp 20   Wt 174 lb (78.9 kg)   BMI 24.61 kg/m  Elderly male in NAD.  Well-developed well-nourished patient in NAD. HEENT reveals PERLA, EOMI, discs not visualized.  Oral cavity is clear. No oral mucosal lesions are identified. Neck is clear without evidence of cervical or supraclavicular adenopathy. Lungs are clear to A&P. Cardiac examination is essentially unremarkable with regular rate and rhythm without murmur rub or thrill. Abdomen is benign with no organomegaly or masses noted. Motor sensory and DTR levels are equal and symmetric in the upper and lower extremities. Cranial nerves II through XII are grossly intact. Proprioception is intact. No peripheral adenopathy or edema is identified. No motor or sensory levels are noted. Crude visual fields are within normal range.  RADIOLOGY RESULTS: CT scan reviewed compatible with above-stated findings  PLAN: At this time I am going to discontinue follow-up care I will ask his PMD to run a yearly CT scan and be  happy to reevaluate him at any time should any additional input be indicated.  Patient and his daughter know to call at anytime with any concerns.  And pleased he is doing well with no evidence of progressive disease.  I would like to take this opportunity to thank you for allowing me to participate in the care of your patient.Noreene Filbert, MD

## 2023-03-10 ENCOUNTER — Telehealth: Payer: Self-pay | Admitting: Family Medicine

## 2023-03-10 NOTE — Telephone Encounter (Signed)
Called patient to schedule Medicare Annual Wellness Visit (AWV). Left message for patient to call back and schedule Medicare Annual Wellness Visit (AWV).  Last date of AWV: 05/29/2022  Please schedule an appointment at any time with [facility].  If any questions, please contact me at [phone].  Thank you ,  Randon Goldsmith Care Guide [facility] AWV TEAM Direct Dial: [phone]

## 2023-03-11 ENCOUNTER — Other Ambulatory Visit: Payer: Self-pay | Admitting: Family Medicine

## 2023-03-16 ENCOUNTER — Other Ambulatory Visit: Payer: Self-pay | Admitting: Family Medicine

## 2023-04-08 ENCOUNTER — Ambulatory Visit: Payer: Medicare Other

## 2023-04-13 ENCOUNTER — Ambulatory Visit: Payer: Medicare Other | Admitting: Radiation Oncology

## 2023-05-04 ENCOUNTER — Telehealth: Payer: Self-pay | Admitting: Family Medicine

## 2023-05-04 NOTE — Telephone Encounter (Signed)
Contacted Jose Castro to schedule their annual wellness visit. Patient declined to schedule AWV at this time.  [facility] Care Guide [facility] AWV TEAM Direct Dial: [phone]

## 2023-06-13 ENCOUNTER — Other Ambulatory Visit: Payer: Self-pay | Admitting: Family Medicine

## 2023-06-13 DIAGNOSIS — K219 Gastro-esophageal reflux disease without esophagitis: Secondary | ICD-10-CM

## 2023-06-15 ENCOUNTER — Other Ambulatory Visit: Payer: Self-pay

## 2023-06-15 DIAGNOSIS — K219 Gastro-esophageal reflux disease without esophagitis: Secondary | ICD-10-CM

## 2023-06-15 MED ORDER — PANTOPRAZOLE SODIUM 20 MG PO TBEC: 20.0000 mg | DELAYED_RELEASE_TABLET | Freq: Every day | ORAL | 0 refills | Status: DC

## 2023-06-29 ENCOUNTER — Ambulatory Visit: Payer: Medicare Other | Admitting: Family Medicine

## 2023-07-03 ENCOUNTER — Ambulatory Visit: Payer: Medicare Other

## 2023-07-15 NOTE — Progress Notes (Unsigned)
There were no vitals taken for this visit.   Subjective:    Patient ID: Jose Castro, male    DOB: 1926/01/15, 89 y.o.   MRN: [id number]  HPI: Jose Castro is a 89 y.o. male  No chief complaint on file.   Relevant past medical, surgical, family and social history reviewed and updated as indicated. Interim medical history since our last visit reviewed. Allergies and medications reviewed and updated.  Review of Systems  Per HPI unless specifically indicated above     Objective:    There were no vitals taken for this visit.  Wt Readings from Last 3 Encounters:  02/26/23 174 lb (78.9 kg)  02/16/23 173 lb 9.6 oz (78.7 kg)  11/17/22 170 lb 12.8 oz (77.5 kg)    Physical Exam  Results for orders placed or performed in visit on 02/16/23  Lipid panel  Result Value Ref Range   Cholesterol 91 <200 mg/dL   HDL 30 (L) > OR = 40 mg/dL   Triglycerides 865 <784 mg/dL   LDL Cholesterol (Calc) 41 mg/dL (calc)   Total CHOL/HDL Ratio 3.0 <5.0 (calc)   Non-HDL Cholesterol (Calc) 61 <696 mg/dL (calc)  CBC with Differential/Platelet  Result Value Ref Range   WBC 8.1 3.8 - 10.8 Thousand/uL   RBC 4.58 4.20 - 5.80 Million/uL   Hemoglobin 14.5 13.2 - 17.1 g/dL   HCT 29.5 28.4 - 13.2 %   MCV 94.5 80.0 - 100.0 fL   MCH 31.7 27.0 - 33.0 pg   MCHC 33.5 32.0 - 36.0 g/dL   RDW 44.0 10.2 - 72.5 %   Platelets 251 140 - 400 Thousand/uL   MPV 11.3 7.5 - 12.5 fL   Neutro Abs 4,277 1,500 - 7,800 cells/uL   Lymphs Abs 2,300 850 - 3,900 cells/uL   Absolute Monocytes 1,045 (H) 200 - 950 cells/uL   Eosinophils Absolute 389 15 - 500 cells/uL   Basophils Absolute 89 0 - 200 cells/uL   Neutrophils Relative % 52.8 %   Total Lymphocyte 28.4 %   Monocytes Relative 12.9 %   Eosinophils Relative 4.8 %   Basophils Relative 1.1 %  COMPLETE METABOLIC PANEL WITH GFR  Result Value Ref Range   Glucose, Bld 90 65 - 99 mg/dL   BUN 25 7 - 25 mg/dL   Creat 3.66 (H) 4.40 - 1.22 mg/dL   eGFR 53 (L) > OR = 60  mL/min/1.56m2   BUN/Creatinine Ratio 20 6 - 22 (calc)   Sodium 140 135 - 146 mmol/L   Potassium 5.2 3.5 - 5.3 mmol/L   Chloride 104 98 - 110 mmol/L   CO2 28 20 - 32 mmol/L   Calcium 9.5 8.6 - 10.3 mg/dL   Total Protein 6.4 6.1 - 8.1 g/dL   Albumin 4.0 3.6 - 5.1 g/dL   Globulin 2.4 1.9 - 3.7 g/dL (calc)   AG Ratio 1.7 1.0 - 2.5 (calc)   Total Bilirubin 0.9 0.2 - 1.2 mg/dL   Alkaline phosphatase (APISO) 59 35 - 144 U/L   AST 14 10 - 35 U/L   ALT 7 (L) 9 - 46 U/L  B12 and Folate Panel  Result Value Ref Range   Vitamin B-12 273 200 - 1,100 pg/mL   Folate 18.3 ng/mL  VITAMIN D 25 Hydroxy (Vit-D Deficiency, Fractures)  Result Value Ref Range   Vit D, 25-Hydroxy 67 30 - 100 ng/mL      Assessment & Plan:   Problem List Items Addressed This  Visit   None    Follow up plan: No follow-ups on file.

## 2023-07-16 ENCOUNTER — Encounter: Payer: Self-pay | Admitting: Nurse Practitioner

## 2023-07-16 ENCOUNTER — Ambulatory Visit: Payer: Medicare Other | Admitting: Nurse Practitioner

## 2023-07-16 ENCOUNTER — Other Ambulatory Visit: Payer: Self-pay

## 2023-07-16 VITALS — BP 124/72 | HR 85 | Temp 98.2°F | Resp 16 | Ht 70.5 in | Wt 167.2 lb

## 2023-07-16 DIAGNOSIS — M545 Low back pain, unspecified: Secondary | ICD-10-CM | POA: Diagnosis not present

## 2023-07-16 MED ORDER — METHOCARBAMOL 500 MG PO TABS: 500.0000 mg | ORAL_TABLET | Freq: Two times a day (BID) | ORAL | 0 refills | Status: DC | PRN

## 2023-09-10 ENCOUNTER — Other Ambulatory Visit: Payer: Self-pay | Admitting: Family Medicine

## 2023-09-10 DIAGNOSIS — Z8719 Personal history of other diseases of the digestive system: Secondary | ICD-10-CM

## 2023-09-12 ENCOUNTER — Other Ambulatory Visit: Payer: Self-pay | Admitting: Family Medicine

## 2023-09-12 DIAGNOSIS — K219 Gastro-esophageal reflux disease without esophagitis: Secondary | ICD-10-CM

## 2023-09-12 DIAGNOSIS — Z8719 Personal history of other diseases of the digestive system: Secondary | ICD-10-CM

## 2023-09-14 ENCOUNTER — Other Ambulatory Visit: Payer: Self-pay | Admitting: Family Medicine

## 2023-09-14 DIAGNOSIS — Z8719 Personal history of other diseases of the digestive system: Secondary | ICD-10-CM

## 2023-09-17 ENCOUNTER — Other Ambulatory Visit: Payer: Self-pay

## 2023-09-17 ENCOUNTER — Telehealth: Payer: Self-pay | Admitting: Family Medicine

## 2023-09-17 ENCOUNTER — Other Ambulatory Visit: Payer: Self-pay | Admitting: Family Medicine

## 2023-09-17 DIAGNOSIS — Z8719 Personal history of other diseases of the digestive system: Secondary | ICD-10-CM

## 2023-09-17 MED ORDER — URSODIOL 300 MG PO CAPS
300.0000 mg | ORAL_CAPSULE | Freq: Every day | ORAL | 0 refills | Status: DC
Start: 2023-09-17 — End: 2023-12-09

## 2023-09-17 NOTE — Telephone Encounter (Signed)
Copied from CRM [id number]. Topic: General - Inquiry >> Sep 16, 2023  4:35 PM Marlow Baars wrote: Reason for CRM: The daughter of the patient who is on the patients chart to discuss matters called in stating she is having difficulty getting her fathers refills for ursodiol (ACTIGALL) 300 MG capsule. She said it has been called in multiple times and either states not appropriate or provider is not at this location. Please assist further as the daughter states his provider was the last one to authorize refills and he only has a few pills left.

## 2023-09-17 NOTE — Telephone Encounter (Signed)
Karin Golden, patient's daughter, called back to check the status of this prescription being sent in. Advised Karin Golden medication has been sent in for a 90 day supply. Scheduled patient for a routine follow up with Dr Carlynn Purl 12/23/2023, per Karin Golden requested to keep f/u with PCP and that is the next available.

## 2023-11-15 NOTE — Progress Notes (Signed)
MRN : [id number]  Jose Castro is a 89 y.o. (1926-05-22) male who presents with chief complaint of check circulation.  History of Present Illness:  The patient returns to the office for followup and review of the noninvasive studies.    There have been no interval changes in lower extremity symptoms. No interval shortening of the patient's claudication distance or development of rest pain symptoms. No new ulcers or wounds have occurred since the last visit.   The patient is followed for leg right lower extremity numbness.  The original complaint was of numbness in his right foot.  The patient notes that the numbness happens more so at night and it is in his toes and foot area.  He notes that this pain comes and goes.  He notes that if he stands or walks or moves his foot around the pain tends to go away.  This pain does not happen consistently.  He denies any claudication or rest pain like symptoms.  The patient has had a previous history of a spinal fusion about 7 to 8 years ago.     The patient denies amaurosis fugax or recent TIA symptoms. There are no documented recent neurological changes noted.   There is no history of DVT, PE or superficial thrombophlebitis. The patient denies recent episodes of angina or shortness of breath.    He follows with Dr. Aggie Cosier and is s/p radiation recently for a nodule in his lung.  He denies any fever, chills, nausea or vomiting.  He denies any hemoptysis.  Follow up scans are stable   ABI's Rt=1.15 and Lt=0.92 (previous ABI Rt=1.06 and Lt=0.66 ).   Previous carotid duplex shows RICA Occluded (known) and LICA 1-39% (previous carotid duplex done at [facility] dated 05/22/2019 showed RICA occlusion and LICA 1-39%)   CT scan of the chest dated 02/19/2023 reviewed by me demonstrates an ascending aortic aneurysm measuring 4.1 cm.  The descending thoracic aorta is ectatic measuring 3.0 of note it  is somewhat thickened suggesting some inflammatory changes.  No outpatient medications have been marked as taking for the 11/16/23 encounter (Appointment) with Gilda Crease, Latina Craver, MD.    Past Medical History:  Diagnosis Date   Allergy    Penicillin   Arthritis    Basal cell carcinoma    BPH (benign prostatic hyperplasia)    Chronic kidney disease    had a kidney stone which per family was a cyst that was removed   Dysrhythmia    GERD (gastroesophageal reflux disease)    History of cholecystitis 02/16/2023   History of kidney stones    Hyperlipidemia    Hypertension    Left-sided low back pain with left-sided sciatica    Lung mass    Splenic vein thrombosis    Stroke (HCC) 05/22/2019   Thrombosis 12/2015   mural  area and no notation of heart attack    Past Surgical History:  Procedure Laterality Date   APPENDECTOMY     CATARACT EXTRACTION, BILATERAL     CYSTOSCOPY WITH INSERTION OF UROLIFT     ENDOSCOPIC RETROGRADE CHOLANGIOPANCREATOGRAPHY (ERCP)  WITH PROPOFOL N/A 05/11/2020   Procedure: ENDOSCOPIC RETROGRADE CHOLANGIOPANCREATOGRAPHY (ERCP) WITH PROPOFOL;  Surgeon: Midge Minium, MD;  Location: [facility] ENDOSCOPY;  Service: Endoscopy;  Laterality: N/A;   ERCP N/A 10/23/2020   Procedure: ENDOSCOPIC RETROGRADE CHOLANGIOPANCREATOGRAPHY (ERCP);  Surgeon: Midge Minium, MD;  Location: [facility] ENDOSCOPY;  Service: Endoscopy;  Laterality: N/A;   EYE SURGERY  08/2018   IR EXCHANGE BILIARY DRAIN  05/04/2020   KIDNEY STONE SURGERY     KNEE SURGERY     PACEMAKER INSERTION N/A 07/13/2018   Procedure: INSERTION PACEMAKER-DUEL CHAMBER INITIAL IMPLANT;  Surgeon: Marcina Millard, MD;  Location: [facility] ORS;  Service: Cardiovascular;  Laterality: N/A;   SPINE SURGERY      Social History Social History   Tobacco Use   Smoking status: Former    Current packs/day: 0.00    Types: Cigarettes    Start date: 10/21/1944    Quit date: 10/21/1960    Years since quitting: 63.1   Smokeless tobacco: Never   Vaping Use   Vaping status: Never Used  Substance Use Topics   Alcohol use: Yes    Alcohol/week: 3.0 standard drinks of alcohol    Types: 3 Standard drinks or equivalent per week    Comment: once a week when he goes out to dinner-socially   Drug use: No    Family History Family History  Problem Relation Age of Onset   Cancer Brother        bladder cancer with mets   Heart disease Mother    Aortic aneurysm Mother    Heart attack Maternal Aunt    Heart attack Maternal Uncle     Allergies  Allergen Reactions   Penicillins Itching and Swelling    Pt reported arm rash and swelling with penicillin administration while he was in the [employer].  However, pt was able to tolerate IV Unasyn without any problem, so penicillin allergy unlikely to be real or serious.  05/14/2020.     REVIEW OF SYSTEMS (Negative unless checked)  Constitutional: [] Weight loss  [] Fever  [] Chills Cardiac: [] Chest pain   [] Chest pressure   [] Palpitations   [] Shortness of breath when laying flat   [] Shortness of breath with exertion. Vascular:  [x] Pain in legs with walking   [] Pain in legs at rest  [] History of DVT   [] Phlebitis   [] Swelling in legs   [] Varicose veins   [] Non-healing ulcers Pulmonary:   [] Uses home oxygen   [] Productive cough   [] Hemoptysis   [] Wheeze  [] COPD   [] Asthma Neurologic:  [] Dizziness   [] Seizures   [] History of stroke   [] History of TIA  [] Aphasia   [] Vissual changes   [] Weakness or numbness in arm   [] Weakness or numbness in leg Musculoskeletal:   [] Joint swelling   [] Joint pain   [] Low back pain Hematologic:  [] Easy bruising  [] Easy bleeding   [] Hypercoagulable state   [] Anemic Gastrointestinal:  [] Diarrhea   [] Vomiting  [] Gastroesophageal reflux/heartburn   [] Difficulty swallowing. Genitourinary:  [] Chronic kidney disease   [] Difficult urination  [] Frequent urination   [] Blood in urine Skin:  [] Rashes   [] Ulcers  Psychological:  [] History of anxiety   []  History of major  depression.  Physical Examination  There were no vitals filed for this visit. There is no height or weight on file to calculate BMI. Gen: WD/WN, NAD Head: /AT, No temporalis wasting.  Ear/Nose/Throat: Hearing grossly intact, nares w/o erythema or drainage Eyes: PER, EOMI, sclera nonicteric.  Neck: Supple, no masses.  No bruit or  JVD.  Pulmonary:  Good air movement, no audible wheezing, no use of accessory muscles.  Cardiac: RRR, normal S1, S2, no Murmurs. Vascular:  mild trophic changes, no open wounds Vessel Right Left  Radial Palpable Palpable  PT Not Palpable Not Palpable  DP Not Palpable Not Palpable  Gastrointestinal: soft, non-distended. No guarding/no peritoneal signs.  Musculoskeletal: M/S 5/5 throughout.  No visible deformity.  Neurologic: CN 2-12 intact. Pain and light touch intact in extremities.  Symmetrical.  Speech is fluent. Motor exam as listed above. Psychiatric: Judgment intact, Mood & affect appropriate for pt's clinical situation. Dermatologic: No rashes or ulcers noted.  No changes consistent with cellulitis.   CBC Lab Results  Component Value Date   WBC 8.1 02/16/2023   HGB 14.5 02/16/2023   HCT 43.3 02/16/2023   MCV 94.5 02/16/2023   PLT 251 02/16/2023    BMET    Component Value Date/Time   NA 140 02/16/2023 1512   NA 144 11/19/2016 0955   NA 138 11/27/2013 0819   K 5.2 02/16/2023 1512   K 4.1 11/27/2013 0819   CL 104 02/16/2023 1512   CL 107 11/27/2013 0819   CO2 28 02/16/2023 1512   CO2 23 11/27/2013 0819   GLUCOSE 90 02/16/2023 1512   GLUCOSE 101 (H) 11/27/2013 0819   BUN 25 02/16/2023 1512   BUN 25 11/19/2016 0955   BUN 22 (H) 11/27/2013 0819   CREATININE 1.25 (H) 02/16/2023 1512   CALCIUM 9.5 02/16/2023 1512   CALCIUM 8.2 (L) 11/27/2013 0819   GFRNONAA 54 (L) 06/25/2021 1017   GFRNONAA 53 (L) 03/11/2021 1419   GFRAA 62 03/11/2021 1419   CrCl cannot be calculated (Patient's most recent lab result is older than the maximum 21 days  allowed.).  COAG Lab Results  Component Value Date   INR 2.0 (H) 05/02/2020   INR 1.1 05/21/2019   INR 1.04 07/06/2018    Radiology No results found.   Assessment/Plan 1. 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  The patient does not voice lifestyle limiting changes at this point in time.  Noninvasive studies do not suggest clinically significant change.  No invasive studies, angiography or surgery at this time The patient should continue walking and begin a more formal exercise program.  The patient should continue antiplatelet therapy and aggressive treatment of the lipid abnormalities  No changes in the patient's medications at this time  Continued surveillance is indicated as atherosclerosis is likely to progress with time.    The patient will continue follow up with noninvasive studies as ordered.  - VAS [place] ABI WITH/WO TBI; Future  2. Bilateral carotid artery stenosis Recommend:  Given the patient's asymptomatic subcritical stenosis no further invasive testing or surgery at this time.  Previous carotid duplex shows RICA Occluded (known) and LICA 1-39% (previous carotid duplex done at [facility] dated 05/22/2019 showed RICA occlusion and LICA 1-39%)   Continue antiplatelet therapy as prescribed Continue management of CAD, HTN and Hyperlipidemia Healthy heart diet,  encouraged exercise at least 4 times per week  Follow up in 24 months with duplex ultrasound and physical exam   3. Thoracic aortic aneurysm without rupture, unspecified part (HCC) Recommend:  No surgery or intervention is indicated at this time.  CT scan of the chest dated 02/19/2023 reviewed by me demonstrates an ascending aortic aneurysm measuring 4.1 cm.  The descending  thoracic aorta is ectatic measuring 3.0 of note it is somewhat thickened  suggesting some inflammatory changes.  The patient has an asymptomatic thoracic aortic aneurysm that is less than 6.0 cm in maximal diameter.  I have discussed the natural history of thoracic aortic aneurysm and the small risk of rupture for aneurysm less than 6.5 cm in size.  However, as these small aneurysms tend to enlarge over time, continued surveillance with CT scan is mandatory.   I have also discussed optimizing medical management with hypertension and lipid control and the negative effect that any tobacco products have on aneurysmal disease.  The patient is also encouraged to exercise a minimum of 30 minutes 4 times a week.   Should the patient develop new onset chest or back pain or signs of peripheral embolization they are instructed to seek medical attention immediately and to alert the physician providing care that they have an aneurysm in the chest.   The patient voices their understanding.  The patient will return as ordered with a CT scan of the chest  4. Chronic venous insufficiency No surgery or intervention at this point in time.   The patient is CEAP [id number]   I have discussed with the patient venous insufficiency and why it  causes symptoms. I have discussed with the patient the chronic skin changes that accompany venous insufficiency and the long term sequela such as infection and ulceration.  Patient will begin wearing graduated compression stockings or compression wraps on a daily basis.  The patient will put the compression on first thing in the morning and removing them in the evening. The patient is instructed specifically not to sleep in the compression.    In addition, behavioral modification including several periods of elevation of the lower extremities during the day will be continued. I have demonstrated that proper elevation is a position with the ankles at heart level.  The patient is instructed to begin routine exercise, especially walking on a daily  basis  The patient will be assessed for a Lymph Pump depending on the effectiveness of conservative therapy and the control of the associated lymphedema.  5. Chronic atrial fibrillation (HCC) Continue antiarrhythmia medications as already ordered, these medications have been reviewed and there are no changes at this time.  Continue anticoagulation as ordered by Cardiology Service  6. Essential hypertension Continue antihypertensive medications as already ordered, these medications have been reviewed and there are no changes at this time.    Levora Dredge, MD  11/15/2023 4:25 PM

## 2023-11-16 ENCOUNTER — Ambulatory Visit (INDEPENDENT_AMBULATORY_CARE_PROVIDER_SITE_OTHER): Payer: Medicare Other

## 2023-11-16 ENCOUNTER — Ambulatory Visit (INDEPENDENT_AMBULATORY_CARE_PROVIDER_SITE_OTHER): Payer: Medicare Other | Admitting: Vascular Surgery

## 2023-11-16 ENCOUNTER — Encounter (INDEPENDENT_AMBULATORY_CARE_PROVIDER_SITE_OTHER): Payer: Self-pay | Admitting: Vascular Surgery

## 2023-11-16 VITALS — BP 111/73 | HR 71 | Resp 18 | Ht 69.0 in | Wt 165.4 lb

## 2023-11-16 DIAGNOSIS — I6523 Occlusion and stenosis of bilateral carotid arteries: Secondary | ICD-10-CM

## 2023-11-16 DIAGNOSIS — I70213 Atherosclerosis of native arteries of extremities with intermittent claudication, bilateral legs: Secondary | ICD-10-CM | POA: Diagnosis not present

## 2023-11-16 DIAGNOSIS — I712 Thoracic aortic aneurysm, without rupture, unspecified: Secondary | ICD-10-CM

## 2023-11-16 DIAGNOSIS — I872 Venous insufficiency (chronic) (peripheral): Secondary | ICD-10-CM

## 2023-11-16 DIAGNOSIS — I482 Chronic atrial fibrillation, unspecified: Secondary | ICD-10-CM

## 2023-11-16 DIAGNOSIS — I10 Essential (primary) hypertension: Secondary | ICD-10-CM

## 2023-11-16 LAB — VAS US ABI WITH/WO TBI
Left ABI: 0.92
Right ABI: 1.15

## 2023-11-20 ENCOUNTER — Encounter (INDEPENDENT_AMBULATORY_CARE_PROVIDER_SITE_OTHER): Payer: Self-pay | Admitting: Vascular Surgery

## 2023-12-09 ENCOUNTER — Other Ambulatory Visit: Payer: Self-pay | Admitting: Family Medicine

## 2023-12-09 DIAGNOSIS — K219 Gastro-esophageal reflux disease without esophagitis: Secondary | ICD-10-CM

## 2023-12-09 DIAGNOSIS — Z8719 Personal history of other diseases of the digestive system: Secondary | ICD-10-CM

## 2023-12-21 ENCOUNTER — Ambulatory Visit (INDEPENDENT_AMBULATORY_CARE_PROVIDER_SITE_OTHER): Payer: Medicare Other | Admitting: Physician Assistant

## 2023-12-21 ENCOUNTER — Encounter: Payer: Self-pay | Admitting: Physician Assistant

## 2023-12-21 VITALS — BP 120/70 | HR 86 | Resp 16 | Ht 69.0 in | Wt 165.0 lb

## 2023-12-21 DIAGNOSIS — K219 Gastro-esophageal reflux disease without esophagitis: Secondary | ICD-10-CM | POA: Diagnosis not present

## 2023-12-21 DIAGNOSIS — E782 Mixed hyperlipidemia: Secondary | ICD-10-CM | POA: Diagnosis not present

## 2023-12-21 DIAGNOSIS — I482 Chronic atrial fibrillation, unspecified: Secondary | ICD-10-CM

## 2023-12-21 DIAGNOSIS — I10 Essential (primary) hypertension: Secondary | ICD-10-CM

## 2023-12-21 DIAGNOSIS — N1831 Chronic kidney disease, stage 3a: Secondary | ICD-10-CM | POA: Diagnosis not present

## 2023-12-21 NOTE — Assessment & Plan Note (Signed)
Chronic, historic Appears stable at this time Taking Metoprolol 25 mg PO every day Continue current regimen Follow up in 3 months or sooner if concerns arise

## 2023-12-21 NOTE — Assessment & Plan Note (Signed)
Chronic, historic condition Appears to be well managed on current regimen comprised of Pantoprazole 20 mg PO every day  Recommend he continues current regimen  Avoid trigger foods  Follow up in 6 months or sooner if concerns arise

## 2023-12-21 NOTE — Progress Notes (Signed)
Established Patient Office Visit  Name: Jose Castro   MRN: [id number]    DOB: 1925/12/27   Date:12/21/2023  Today's Provider: Jacquelin Hawking, MHS, PA-C Introduced myself to the patient as a PA-C and provided education on APPs in clinical practice.         Subjective  Chief Complaint  Chief Complaint  Patient presents with   Medical Management of Chronic Issues    6 months   Medication Refill    HPI   GERD GERD control status: stable Satisfied with current treatment? yes Heartburn frequency: infrequent  Medication side effects: no  Medication compliance: better and stable Antacid use frequency:  none  Dysphagia: no Odynophagia:  no Hematemesis: no Blood in stool: no   HYPERTENSION / HYPERLIPIDEMIA Satisfied with current treatment? yes Duration of hypertension: years BP monitoring frequency: not checking BP range:  BP medication side effects: no  BP meds: metoprolol 25 mg PO QD Duration of hyperlipidemia: years Cholesterol medication side effects: no Cholesterol supplements: fish oil  cholesterol medications: rosuvastatin (crestor) Medication compliance: good compliance Aspirin: no Recent stressors: no Recurrent headaches: no Visual changes: no Palpitations: no Dyspnea: no Chest pain: no Lower extremity edema: no Dizzy/lightheaded: no    Patient Active Problem List   Diagnosis Date Noted   Degenerative disease of nervous system, unspecified (HCC) 02/16/2023   Senile purpura (HCC) 02/16/2023   History of cholecystitis 02/16/2023   Low serum vitamin B12 02/16/2023   Gait instability 02/16/2023   History of recent fall 02/16/2023   Atherosclerosis of native arteries of extremity with intermittent claudication (HCC) 11/25/2021   Localized osteoarthritis of knees, bilateral 06/13/2021   Hemiparesis affecting left side as late effect of cerebrovascular accident (CVA) (HCC) 03/11/2021   Aneurysm, aorta, thoracic (HCC) 12/21/2020   Calculus of bile  duct without cholecystitis and without obstruction    Non-small cell carcinoma of lung, right (HCC) 09/07/2020   Mass of upper lobe of right lung 07/13/2020   Calculus of bile duct with cholecystitis without obstruction    Abnormal findings on imaging of biliary tract    Acute cholecystitis 05/02/2020   Chronic venous insufficiency 12/05/2019   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    CKD (chronic kidney disease), stage II    Atrial fibrillation (HCC)    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   Goals of care, counseling/discussion    Palliative care by specialist    DNR (do not resuscitate) discussion    History of CVA (cerebrovascular accident) 05/22/2019   Persistent proteinuria 07/30/2018   Mobitz type 2 second degree heart block 07/13/2018   Chronic kidney disease, stage III (moderate) (HCC) 06/15/2018   Leg pain 05/06/2018   Lymphedema 05/06/2018   Coagulopathy (HCC) 07/07/2017   Lumbar spondylosis 03/10/2017   Elevated uric acid in blood 11/20/2016   BPH (benign prostatic hyperplasia) 05/29/2016   Peripheral vascular disease of lower extremity (HCC) 03/03/2016   Gallstone 12/31/2015   Splenic infarct 12/31/2015   Splenic vein thrombosis 11/27/2015   Atherosclerosis of aorta (HCC) 11/22/2015   Carotid artery narrowing 11/22/2015   Diverticulosis of colon 11/22/2015   Decreased creatinine clearance 11/22/2015   Arthritis, degenerative 11/22/2015   Lactose intolerance 11/22/2015   Basal cell carcinoma 11/22/2015   Essential hypertension 06/21/2015   Hyperlipemia 06/21/2015   GERD (gastroesophageal reflux disease) 06/21/2015   Calculus of kidney 11/18/2013    Past Surgical History:  Procedure Laterality Date  APPENDECTOMY     CATARACT EXTRACTION, BILATERAL     CYSTOSCOPY WITH INSERTION OF UROLIFT     ENDOSCOPIC RETROGRADE CHOLANGIOPANCREATOGRAPHY (ERCP) WITH PROPOFOL N/A 05/11/2020   Procedure: ENDOSCOPIC RETROGRADE CHOLANGIOPANCREATOGRAPHY (ERCP)  WITH PROPOFOL;  Surgeon: Midge Minium, MD;  Location: [facility] ENDOSCOPY;  Service: Endoscopy;  Laterality: N/A;   ERCP N/A 10/23/2020   Procedure: ENDOSCOPIC RETROGRADE CHOLANGIOPANCREATOGRAPHY (ERCP);  Surgeon: Midge Minium, MD;  Location: [facility] ENDOSCOPY;  Service: Endoscopy;  Laterality: N/A;   EYE SURGERY  08/2018   IR EXCHANGE BILIARY DRAIN  05/04/2020   KIDNEY STONE SURGERY     KNEE SURGERY     PACEMAKER INSERTION N/A 07/13/2018   Procedure: INSERTION PACEMAKER-DUEL CHAMBER INITIAL IMPLANT;  Surgeon: Marcina Millard, MD;  Location: [facility] ORS;  Service: Cardiovascular;  Laterality: N/A;   SPINE SURGERY      Family History  Problem Relation Age of Onset   Cancer Brother        bladder cancer with mets   Heart disease Mother    Aortic aneurysm Mother    Heart attack Maternal Aunt    Heart attack Maternal Uncle     Social History   Tobacco Use   Smoking status: Former    Current packs/day: 0.00    Types: Cigarettes    Start date: 10/21/1944    Quit date: 10/21/1960    Years since quitting: 63.2   Smokeless tobacco: Never  Substance Use Topics   Alcohol use: Yes    Alcohol/week: 3.0 standard drinks of alcohol    Types: 3 Standard drinks or equivalent per week    Comment: once a week when he goes out to dinner-socially     Current Outpatient Medications:    acetaminophen (TYLENOL) 325 MG tablet, Take 2 tablets (650 mg total) by mouth every 4 (four) hours as needed for mild pain (or temp > 37.5 C (99.5 F))., Disp:  , Rfl:    apixaban (ELIQUIS) 2.5 MG TABS tablet, Take 1 tablet (2.5 mg total) by mouth 2 (two) times daily., Disp: 60 tablet, Rfl: 0   Cholecalciferol (VITAMIN D3) 50 MCG (2000 UT) TABS, Take 2,000 Units by mouth daily., Disp: 30 tablet, Rfl: 0   methocarbamol (ROBAXIN) 500 MG tablet, Take 1 tablet (500 mg total) by mouth 2 (two) times daily as needed for muscle spasms., Disp: 20 tablet, Rfl: 0   metoprolol succinate (TOPROL XL) 25 MG 24 hr tablet, Take 1 tablet (25  mg total) by mouth daily., Disp: 30 tablet, Rfl: 0   Multiple Vitamins-Minerals (ZINC PO), Take 500 mg by mouth 2 (two) times daily., Disp: , Rfl:    Omega-3 Fatty Acids (FISH OIL) 1000 MG CAPS, Take 1,000 mg by mouth daily. , Disp: , Rfl:    pantoprazole (PROTONIX) 20 MG tablet, TAKE ONE TABLET BY MOUTH EVERY DAY, Disp: 90 tablet, Rfl: 0   rosuvastatin (CRESTOR) 40 MG tablet, Take 1 tablet (40 mg total) by mouth daily at 6 PM., Disp: 30 tablet, Rfl: 0   ursodiol (ACTIGALL) 300 MG capsule, TAKE 1 CAPSULE BY MOUTH ONCE DAILY, Disp: 90 capsule, Rfl: 0  Allergies  Allergen Reactions   Penicillins Itching and Swelling    Pt reported arm rash and swelling with penicillin administration while he was in the [employer].  However, pt was able to tolerate IV Unasyn without any problem, so penicillin allergy unlikely to be real or serious.  05/14/2020.    I personally reviewed active problem list, medication list, allergies, health maintenance, notes  from last encounter, notes from last 2 encounters, lab results with the patient/caregiver today.   ROS  See HPI for relevant ROS    Objective  Vitals:   12/21/23 1339  BP: 120/70  Pulse: 86  Resp: 16  SpO2: 99%  Weight: 165 lb (74.8 kg)  Height: 5\' 9"  (1.753 m)    Body mass index is 24.37 kg/m.  Physical Exam Vitals reviewed.  Constitutional:      General: He is awake.     Appearance: Normal appearance. He is well-developed and well-groomed.  HENT:     Head: Normocephalic and atraumatic.  Cardiovascular:     Rate and Rhythm: Normal rate and regular rhythm.     Pulses: Normal pulses.          Radial pulses are 2+ on the right side and 2+ on the left side.     Heart sounds: Normal heart sounds. No murmur heard.    No friction rub. No gallop.  Pulmonary:     Effort: Pulmonary effort is normal.     Breath sounds: Normal breath sounds. No decreased air movement. No decreased breath sounds, wheezing, rhonchi or rales.  Musculoskeletal:      Cervical back: Normal range of motion.     Right lower leg: No edema.     Left lower leg: No edema.  Neurological:     General: No focal deficit present.     Mental Status: He is alert and oriented to person, place, and time. Mental status is at baseline.     GCS: GCS eye subscore is 4. GCS verbal subscore is 5. GCS motor subscore is 6.  Psychiatric:        Attention and Perception: Attention and perception normal.        Mood and Affect: Mood and affect normal.        Speech: Speech normal.        Behavior: Behavior normal. Behavior is cooperative.        Thought Content: Thought content normal.        Cognition and Memory: Cognition normal.      Recent Results (from the past 2160 hours)  VAS [place] ABI WITH/WO TBI     Status: None   Collection Time: 11/16/23  2:30 PM  Result Value Ref Range   Right ABI 1.15    Left ABI .92      PHQ2/9:    07/16/2023   11:49 AM 02/16/2023    2:23 PM 05/29/2022   11:57 AM 09/10/2021   12:57 PM 08/16/2021   10:55 AM  Depression screen PHQ 2/9  Decreased Interest 0 0 0 0 0  Down, Depressed, Hopeless 0 0 0 0 0  PHQ - 2 Score 0 0 0 0 0  Altered sleeping  0 0    Tired, decreased energy  0 0    Change in appetite  0 0    Feeling bad or failure about yourself   0 0    Trouble concentrating  0 0    Moving slowly or fidgety/restless  0 0    Suicidal thoughts  0 0    PHQ-9 Score  0 0    Difficult doing work/chores   Not difficult at all        Fall Risk:    07/16/2023   11:49 AM 02/16/2023    2:23 PM 05/29/2022   11:57 AM 09/10/2021   12:57 PM 08/16/2021   10:55 AM  Fall Risk  Falls in the past year? 0 0 0 0 1  Number falls in past yr: 0  0 0 0  Injury with Fall? 0  0 0 0  Risk for fall due to :  Impaired balance/gait No Fall Risks Impaired balance/gait Impaired balance/gait;Impaired mobility  Follow up  Falls prevention discussed;Education provided;Falls evaluation completed Falls prevention discussed;Education provided Falls prevention  discussed Falls prevention discussed      Functional Status Survey:      Assessment & Plan  Problem List Items Addressed This Visit       Cardiovascular and Mediastinum   Essential hypertension   Chronic, historic Appears stable at this time Taking Metoprolol 25 mg PO every day Continue current regimen Follow up in 3 months or sooner if concerns arise        Relevant Orders   CBC w/Diff/Platelet   Atrial fibrillation (HCC)   Chronic, historic condition Sees Cardiology regularly Currently taking Eliquis 2.5 mg PO BID and metoprolol 25 mg PO every day  Appears to be tolerating well  Continue current regimen  Follow up in 3 months or sooner if concerns arise          Digestive   GERD (gastroesophageal reflux disease)   Chronic, historic condition Appears to be well managed on current regimen comprised of Pantoprazole 20 mg PO every day  Recommend he continues current regimen  Avoid trigger foods  Follow up in 6 months or sooner if concerns arise           Genitourinary   Chronic kidney disease, stage III (moderate) (HCC) - Primary   Recheck CMP today Results to dictate further management        Relevant Orders   COMPLETE METABOLIC PANEL WITH GFR     Other   Hyperlipemia   Chronic, appears stable  Recheck lipid panel today Results to dictate further management  Continue current regimen Follow up in 6 months or sooner if concerns arise        Relevant Orders   Lipid Profile     Return in about 6 months (around 06/19/2024) for A fib, HLD, stroke follow up, GERD .   I, Tyechia Allmendinger E Jahmiyah Dullea, PA-C, have reviewed all documentation for this visit. The documentation on 12/21/23 for the exam, diagnosis, procedures, and orders are all accurate and complete.   Jacquelin Hawking, MHS, PA-C [facility]

## 2023-12-21 NOTE — Assessment & Plan Note (Signed)
 Recheck CMP today Results to dictate further management

## 2023-12-21 NOTE — Patient Instructions (Addendum)
Please make sure you are taking your Eliquis every 12 hours. It is important that you take this as directed and get up during the morning to make sure you are taking correctly.  Make sure you are getting up throughout the day and exercising as tolerated.  This helps with keeping your body energized and can help with sleeplessness at night. (Make sure you are walking throughout the day) It is important to get up and not stay in bed too long. You can develop complications if you lay around too much and are not active during the day. If you are feeling tired you can take a nap during the afternoon but make sure you are getting up and being active.  Do not bend over to pick things up. Use a grab stick or ask for help. This can help prevent falls and injuries.

## 2023-12-21 NOTE — Assessment & Plan Note (Addendum)
Chronic, historic condition Sees Cardiology regularly Currently taking Eliquis 2.5 mg PO BID and metoprolol 25 mg PO every day - reviewed importance of taking Eliquis every 12 hours  Appears to be tolerating well  Continue current regimen  Follow up in 3 months or sooner if concerns arise

## 2023-12-21 NOTE — Assessment & Plan Note (Signed)
Chronic, appears stable  Recheck lipid panel today Results to dictate further management  Continue current regimen Follow up in 6 months or sooner if concerns arise

## 2023-12-22 ENCOUNTER — Encounter: Payer: Self-pay | Admitting: Physician Assistant

## 2023-12-22 LAB — COMPLETE METABOLIC PANEL WITH GFR
AG Ratio: 1.7 (calc) (ref 1.0–2.5)
ALT: 7 U/L — ABNORMAL LOW (ref 9–46)
AST: 14 U/L (ref 10–35)
Albumin: 4.1 g/dL (ref 3.6–5.1)
Alkaline phosphatase (APISO): 62 U/L (ref 35–144)
BUN: 23 mg/dL (ref 7–25)
CO2: 31 mmol/L (ref 20–32)
Calcium: 9.8 mg/dL (ref 8.6–10.3)
Chloride: 104 mmol/L (ref 98–110)
Creat: 1.18 mg/dL (ref 0.70–1.22)
Globulin: 2.4 g/dL (ref 1.9–3.7)
Glucose, Bld: 67 mg/dL (ref 65–99)
Potassium: 4.9 mmol/L (ref 3.5–5.3)
Sodium: 140 mmol/L (ref 135–146)
Total Bilirubin: 0.9 mg/dL (ref 0.2–1.2)
Total Protein: 6.5 g/dL (ref 6.1–8.1)
eGFR: 57 mL/min/{1.73_m2} — ABNORMAL LOW (ref >=60)

## 2023-12-22 LAB — CBC WITH DIFFERENTIAL/PLATELET
Absolute Lymphocytes: 1829 {cells}/uL (ref 850–3900)
Absolute Monocytes: 1009 {cells}/uL — ABNORMAL HIGH (ref 200–950)
Basophils Absolute: 82 {cells}/uL (ref 0–200)
Basophils Relative: 1 %
Eosinophils Absolute: 262 {cells}/uL (ref 15–500)
Eosinophils Relative: 3.2 %
HCT: 43.6 % (ref 38.5–50.0)
Hemoglobin: 14.7 g/dL (ref 13.2–17.1)
MCH: 32.2 pg (ref 27.0–33.0)
MCHC: 33.7 g/dL (ref 32.0–36.0)
MCV: 95.6 fL (ref 80.0–100.0)
MPV: 11.4 fL (ref 7.5–12.5)
Monocytes Relative: 12.3 %
Neutro Abs: 5018 {cells}/uL (ref 1500–7800)
Neutrophils Relative %: 61.2 %
Platelets: 267 10*3/uL (ref 140–400)
RBC: 4.56 10*6/uL (ref 4.20–5.80)
RDW: 11.6 % (ref 11.0–15.0)
Total Lymphocyte: 22.3 %
WBC: 8.2 10*3/uL (ref 3.8–10.8)

## 2023-12-22 LAB — LIPID PANEL
Cholesterol: 88 mg/dL (ref <200)
HDL: 32 mg/dL — ABNORMAL LOW (ref >=40)
LDL Cholesterol (Calc): 35 mg/dL
Non-HDL Cholesterol (Calc): 56 mg/dL (ref <130)
Total CHOL/HDL Ratio: 2.8 (calc) (ref <5.0)
Triglycerides: 130 mg/dL (ref <150)

## 2023-12-22 NOTE — Progress Notes (Signed)
Your labs are back Your CMP was overall normal. Your kidney function remains a bit decreased but has not changed much since it was checked last.  Your CBC is overall normal/stable. Your cholesterol looks great. Continue your medications as directed and let us know if you have questions or concerns.

## 2023-12-23 ENCOUNTER — Ambulatory Visit: Payer: Medicare Other | Admitting: Family Medicine

## 2024-03-05 ENCOUNTER — Other Ambulatory Visit: Payer: Self-pay | Admitting: Family Medicine

## 2024-03-05 DIAGNOSIS — K219 Gastro-esophageal reflux disease without esophagitis: Secondary | ICD-10-CM

## 2024-03-05 DIAGNOSIS — Z8719 Personal history of other diseases of the digestive system: Secondary | ICD-10-CM

## 2024-04-19 ENCOUNTER — Encounter (INDEPENDENT_AMBULATORY_CARE_PROVIDER_SITE_OTHER): Payer: Self-pay

## 2024-06-06 ENCOUNTER — Other Ambulatory Visit: Payer: Self-pay | Admitting: Family Medicine

## 2024-06-06 DIAGNOSIS — K219 Gastro-esophageal reflux disease without esophagitis: Secondary | ICD-10-CM

## 2024-06-06 DIAGNOSIS — Z8719 Personal history of other diseases of the digestive system: Secondary | ICD-10-CM

## 2024-06-24 ENCOUNTER — Encounter: Payer: Self-pay | Admitting: Family Medicine

## 2024-06-24 ENCOUNTER — Ambulatory Visit: Payer: Self-pay | Admitting: Family Medicine

## 2024-06-24 VITALS — BP 118/72 | HR 84 | Resp 16 | Ht 69.0 in | Wt 149.0 lb

## 2024-06-24 DIAGNOSIS — E538 Deficiency of other specified B group vitamins: Secondary | ICD-10-CM

## 2024-06-24 DIAGNOSIS — I69354 Hemiplegia and hemiparesis following cerebral infarction affecting left non-dominant side: Secondary | ICD-10-CM

## 2024-06-24 DIAGNOSIS — I700 Atherosclerosis of aorta: Secondary | ICD-10-CM | POA: Diagnosis not present

## 2024-06-24 DIAGNOSIS — N1831 Chronic kidney disease, stage 3a: Secondary | ICD-10-CM | POA: Diagnosis not present

## 2024-06-24 DIAGNOSIS — N393 Stress incontinence (female) (male): Secondary | ICD-10-CM

## 2024-06-24 DIAGNOSIS — I7121 Aneurysm of the ascending aorta, without rupture: Secondary | ICD-10-CM

## 2024-06-24 DIAGNOSIS — C3491 Malignant neoplasm of unspecified part of right bronchus or lung: Secondary | ICD-10-CM

## 2024-06-24 DIAGNOSIS — I482 Chronic atrial fibrillation, unspecified: Secondary | ICD-10-CM | POA: Diagnosis not present

## 2024-06-24 DIAGNOSIS — K219 Gastro-esophageal reflux disease without esophagitis: Secondary | ICD-10-CM

## 2024-06-24 DIAGNOSIS — I70213 Atherosclerosis of native arteries of extremities with intermittent claudication, bilateral legs: Secondary | ICD-10-CM

## 2024-06-24 DIAGNOSIS — J410 Simple chronic bronchitis: Secondary | ICD-10-CM

## 2024-06-24 DIAGNOSIS — Z8719 Personal history of other diseases of the digestive system: Secondary | ICD-10-CM

## 2024-06-24 LAB — CBC WITH DIFFERENTIAL/PLATELET
Absolute Lymphocytes: 2210 {cells}/uL (ref 850–3900)
Absolute Monocytes: 859 {cells}/uL (ref 200–950)
Basophils Absolute: 102 {cells}/uL (ref 0–200)
Basophils Relative: 1.2 %
Eosinophils Absolute: 400 {cells}/uL (ref 15–500)
Eosinophils Relative: 4.7 %
HCT: 45.1 % (ref 38.5–50.0)
Hemoglobin: 14.8 g/dL (ref 13.2–17.1)
MCH: 32.2 pg (ref 27.0–33.0)
MCHC: 32.8 g/dL (ref 32.0–36.0)
MCV: 98 fL (ref 80.0–100.0)
MPV: 11.3 fL (ref 7.5–12.5)
Monocytes Relative: 10.1 %
Neutro Abs: 4930 {cells}/uL (ref 1500–7800)
Neutrophils Relative %: 58 %
Platelets: 256 Thousand/uL (ref 140–400)
RBC: 4.6 Million/uL (ref 4.20–5.80)
RDW: 11.6 % (ref 11.0–15.0)
Total Lymphocyte: 26 %
WBC: 8.5 Thousand/uL (ref 3.8–10.8)

## 2024-06-24 LAB — COMPREHENSIVE METABOLIC PANEL WITH GFR
AG Ratio: 1.6 (calc) (ref 1.0–2.5)
ALT: 6 U/L — ABNORMAL LOW (ref 9–46)
AST: 12 U/L (ref 10–35)
Albumin: 3.9 g/dL (ref 3.6–5.1)
Alkaline phosphatase (APISO): 60 U/L (ref 35–144)
BUN: 18 mg/dL (ref 7–25)
CO2: 28 mmol/L (ref 20–32)
Calcium: 9.5 mg/dL (ref 8.6–10.3)
Chloride: 104 mmol/L (ref 98–110)
Creat: 1.2 mg/dL (ref 0.70–1.22)
Globulin: 2.4 g/dL (ref 1.9–3.7)
Glucose, Bld: 105 mg/dL — ABNORMAL HIGH (ref 65–99)
Potassium: 4.4 mmol/L (ref 3.5–5.3)
Sodium: 141 mmol/L (ref 135–146)
Total Bilirubin: 1 mg/dL (ref 0.2–1.2)
Total Protein: 6.3 g/dL (ref 6.1–8.1)
eGFR: 56 mL/min/1.73m2 — ABNORMAL LOW (ref >=60)

## 2024-06-24 LAB — VITAMIN D 25 HYDROXY (VIT D DEFICIENCY, FRACTURES): Vit D, 25-Hydroxy: 92 ng/mL (ref 30–100)

## 2024-06-24 LAB — B12 AND FOLATE PANEL
Folate: 18.8 ng/mL
Vitamin B-12: 349 pg/mL (ref 200–1100)

## 2024-06-24 MED ORDER — ROSUVASTATIN CALCIUM 40 MG PO TABS: 40.0000 mg | ORAL_TABLET | Freq: Every day | ORAL | 1 refills | Status: AC

## 2024-06-24 MED ORDER — URSODIOL 300 MG PO CAPS: 300.0000 mg | ORAL_CAPSULE | Freq: Every day | ORAL | 1 refills | Status: DC

## 2024-06-24 MED ORDER — PANTOPRAZOLE SODIUM 20 MG PO TBEC: 20.0000 mg | DELAYED_RELEASE_TABLET | Freq: Every day | ORAL | 1 refills | Status: DC

## 2024-06-24 NOTE — Progress Notes (Signed)
 Name: Jose Castro   MRN: [id number]    DOB: [birth year]   Date:06/24/2024       Progress Note  Subjective  Chief Complaint  Chief Complaint  Patient presents with   Medical Management of Chronic Issues    History of Percutaneous cholecystostomy : he was admitted on 05/02/2020 and stayed until June 14 th 2021 ,he had a percutaneous cholecystotomy placed but doing well. He had it reversed and he takes Ursodiol  once a day now and doing well, no nausea or vomiting, appetite is good    Non small Cell Carcinoma: diagnosed Summer 2022  he saw  Dr. Willistine and Dr. Camelia, he is on surveillance now, due for repeat CT and follow up with Dr. Camelia, family will contact his office. Patient has a morning cough, but denies hemoptysis, states used to smoke and always coughed in the mornings. No wheezing or SOB  CVA with left side hemiparesis/ Chronic Afib : right MCA distribution infarct on 05/22/2019 likely due to Afib. He has been taking Eliquis  given by Dr. Florencio and also statin therapy. He continues to have some left side weakness and uses a walker for hemiparesis and also Oa knee    Hyperlipidemia/atherosclerosis of Aorta : recheck levels, taking statin therapy and denies side effects   Aneurysm of thoracic aorta: on statin and beta blocker, saw Dr Jaqueline recently    GERD: symptoms have been controlled on lower dose of PPI   PAD history of claudication : he is wearing compression stocking hoses, seen by vascular surgeon, he on statin and eliquis , denies claudication but he does not walk much now, only using walker    CKI stage IIIa: he has good urine output, no pruritus, he is not on medications due to low bp and needs to take beta blocker for Afib - rate control   OA left knee: pain and instability, he has seen Dr. Cleotilde, wearing a soft brace. Discussed topical medication, continue prn tylenol     Patient Active Problem List   Diagnosis Date Noted   Degenerative disease of nervous  system, unspecified (HCC) 02/16/2023   Senile purpura (HCC) 02/16/2023   History of cholecystitis 02/16/2023   Low serum vitamin B12 02/16/2023   Gait instability 02/16/2023   History of recent fall 02/16/2023   Atherosclerosis of native arteries of extremity with intermittent claudication (HCC) 11/25/2021   Localized osteoarthritis of knees, bilateral 06/13/2021   Hemiparesis affecting left side as late effect of cerebrovascular accident (CVA) (HCC) 03/11/2021   Aneurysm, aorta, thoracic (HCC) 12/21/2020   Calculus of bile duct without cholecystitis and without obstruction    Non-small cell carcinoma of lung, right (HCC) 09/07/2020   Calculus of bile duct with cholecystitis without obstruction    Abnormal findings on imaging of biliary tract    Acute cholecystitis 05/02/2020   Chronic venous insufficiency 12/05/2019   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    CKD (chronic kidney disease), stage II    Atrial fibrillation (HCC)    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   Goals of care, counseling/discussion    Palliative care by specialist    DNR (do not resuscitate) discussion    History of CVA (cerebrovascular accident) 05/22/2019   Persistent proteinuria 07/30/2018   Mobitz type 2 second degree heart block 07/13/2018   Chronic kidney disease, stage III (moderate) (HCC) 06/15/2018   Leg pain 05/06/2018   Lymphedema 05/06/2018   Coagulopathy (HCC) 07/07/2017   Lumbar spondylosis 03/10/2017  Elevated uric acid in blood 11/20/2016   BPH (benign prostatic hyperplasia) 05/29/2016   Peripheral vascular disease of lower extremity (HCC) 03/03/2016   Gallstone 12/31/2015   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   Atherosclerosis of aorta (HCC) 11/22/2015   Carotid artery narrowing 11/22/2015   Diverticulosis of colon 11/22/2015   Decreased creatinine clearance 11/22/2015   Arthritis, degenerative 11/22/2015   Lactose intolerance  11/22/2015   Basal cell carcinoma 11/22/2015   Essential hypertension 06/21/2015   Hyperlipemia 06/21/2015   GERD (gastroesophageal reflux disease) 06/21/2015   Calculus of kidney 11/18/2013    Past Surgical History:  Procedure Laterality Date   APPENDECTOMY     CATARACT EXTRACTION, BILATERAL     CYSTOSCOPY WITH INSERTION OF UROLIFT     ENDOSCOPIC RETROGRADE CHOLANGIOPANCREATOGRAPHY (ERCP) WITH PROPOFOL  N/A 05/11/2020   Procedure: ENDOSCOPIC RETROGRADE CHOLANGIOPANCREATOGRAPHY (ERCP) WITH PROPOFOL ;  Surgeon: Jinny Carmine, MD;  Location: [facility] ENDOSCOPY;  Service: Endoscopy;  Laterality: N/A;   ERCP N/A 10/23/2020   Procedure: ENDOSCOPIC RETROGRADE CHOLANGIOPANCREATOGRAPHY (ERCP);  Surgeon: Jinny Carmine, MD;  Location[facility] ENDOSCOPY;  Service: Endoscopy;  Laterality: N/A;   EYE SURGERY  08/2018   IR EXCHANGE BILIARY DRAIN  05/04/2020   KIDNEY STONE SURGERY     KNEE SURGERY     PACEMAKER INSERTION N/A 07/13/2018   Procedure: INSERTION PACEMAKER-DUEL CHAMBER INITIAL IMPLANT;  Surgeon: Ammon Blunt, MD;  Location: [facility] ORS;  Service: Cardiovascular;  Laterality: N/A;   SPINE SURGERY      Family History  Problem Relation Age of Onset   Cancer Brother        bladder cancer with mets   Heart disease Mother    Aortic aneurysm Mother    Heart attack Maternal Aunt    Heart attack Maternal Uncle     Social History   Tobacco Use   Smoking status: Former    Current packs/day: 0.00    Types: Cigarettes    Start date: 10/21/1944    Quit date: 10/21/1960    Years since quitting: 63.7   Smokeless tobacco: Never  Substance Use Topics   Alcohol use: Yes    Alcohol/week: 3.0 standard drinks of alcohol    Types: 3 Standard drinks or equivalent per week    Comment: once a week when he goes out to dinner-socially     Current Outpatient Medications:    acetaminophen  (TYLENOL ) 325 MG tablet, Take 2 tablets (650 mg total) by mouth every 4 (four) hours as needed for mild pain (or temp  > 37.5 C (99.5 F))., Disp:  , Rfl:    apixaban  (ELIQUIS ) 2.5 MG TABS tablet, Take 1 tablet (2.5 mg total) by mouth 2 (two) times daily., Disp: 60 tablet, Rfl: 0   Cholecalciferol  (VITAMIN D3) 50 MCG (2000 UT) TABS, Take 2,000 Units by mouth daily., Disp: 30 tablet, Rfl: 0   metoprolol  succinate (TOPROL  XL) 25 MG 24 hr tablet, Take 1 tablet (25 mg total) by mouth daily., Disp: 30 tablet, Rfl: 0   Multiple Vitamins-Minerals (ZINC PO), Take 500 mg by mouth 2 (two) times daily., Disp: , Rfl:    Omega-3 Fatty Acids (FISH OIL) 1000 MG CAPS, Take 1,000 mg by mouth daily. , Disp: , Rfl:    pantoprazole  (PROTONIX ) 20 MG tablet, Take 1 tablet (20 mg total) by mouth daily., Disp: 90 tablet, Rfl: 1   rosuvastatin  (CRESTOR ) 40 MG tablet, Take 1 tablet (40 mg total) by mouth daily at 6 PM., Disp: 90 tablet, Rfl: 1  ursodiol  (ACTIGALL ) 300 MG capsule, Take 1 capsule (300 mg total) by mouth daily., Disp: 90 capsule, Rfl: 1  Allergies  Allergen Reactions   Penicillins Itching and Swelling    Pt reported arm rash and swelling with penicillin administration while he was in the [employer].  However, pt was able to tolerate IV Unasyn  without any problem, so penicillin allergy unlikely to be real or serious.  05/14/2020.    I personally reviewed active problem list, medication list, allergies with the patient/caregiver today.   ROS  Ten systems reviewed and is negative except as mentioned in HPI    Objective Physical Exam Constitutional: Patient appears frail  No distress.  HEENT: head atraumatic, normocephalic, pupils equal and reactive to light, neck supple Cardiovascular: Normal rate, regular rhythm and normal heart sounds.  No murmur heard. No BLE edema. Pulmonary/Chest: Effort normal and breath sounds normal. No respiratory distress. Psychiatric: Patient has a normal mood and affect. Neuro: good grip, uses a walker, slightly weak on left side   Vitals:   06/24/24 1257  BP: 118/72  Pulse: 84  Resp:  16  SpO2: 96%  Weight: 149 lb (67.6 kg)  Height: 5' 9 (1.753 m)    Body mass index is 22 kg/m.  No results found for this or any previous visit (from the past 2160 hours).  Diabetic Foot Exam:     PHQ2/9:    07/16/2023   11:49 AM 02/16/2023    2:23 PM 05/29/2022   11:57 AM 09/10/2021   12:57 PM 08/16/2021   10:55 AM  Depression screen PHQ 2/9  Decreased Interest 0 0 0 0 0  Down, Depressed, Hopeless 0 0 0 0 0  PHQ - 2 Score 0 0 0 0 0  Altered sleeping  0 0    Tired, decreased energy  0 0    Change in appetite  0 0    Feeling bad or failure about yourself   0 0    Trouble concentrating  0 0    Moving slowly or fidgety/restless  0 0    Suicidal thoughts  0 0    PHQ-9 Score  0 0    Difficult doing work/chores   Not difficult at all      phq 9 is negative  Fall Risk:    07/16/2023   11:49 AM 02/16/2023    2:23 PM 05/29/2022   11:57 AM 09/10/2021   12:57 PM 08/16/2021   10:55 AM  Fall Risk   Falls in the past year? 0 0 0 0 1  Number falls in past yr: 0  0 0 0  Injury with Fall? 0  0 0 0  Risk for fall due to :  Impaired balance/gait No Fall Risks Impaired balance/gait Impaired balance/gait;Impaired mobility  Follow up  Falls prevention discussed;Education provided;Falls evaluation completed Falls prevention discussed;Education provided  Falls prevention discussed  Falls prevention discussed      Data saved with a previous flowsheet row definition     Assessment & Plan  1. Chronic atrial fibrillation (HCC) (Primary)  Rate controlled, on beta blocker and eliquis    2. Stage 3a chronic kidney disease (HCC)  - CBC with Differential/Platelet - Comprehensive metabolic panel with GFR - VITAMIN D  25 Hydroxy (Vit-D Deficiency, Fractures)  3. Atherosclerosis of aorta (HCC)  - rosuvastatin  (CRESTOR ) 40 MG tablet; Take 1 tablet (40 mg total) by mouth daily at 6 PM.  Dispense: 90 tablet; Refill: 1  4. Aneurysm of ascending aorta without rupture (  HCC)  Recently seen by  vascular surgeon   5. Hemiparesis affecting left side as late effect of cerebrovascular accident (CVA) (HCC)  Stable, mild weakness of left side, daughter has to remind him to pick up his feet, uses a walker   6. Atherosclerosis of native artery of both lower extremities with intermittent claudication (HCC)  Under the care of Vascular surgeon, taking Rosuvastatin , does not walk far and has not noticed claudication lately due to limited mobility secondary to OA of left knee  7. Simple chronic bronchitis (HCC)  Coughs every morning, used to smoke, no wheezing.  Discussed inhaler if symptoms gets worse  8. Non-small cell carcinoma of lung, right (HCC)  Last visit with Dr. Camelia was over one year ago, advised to call and re-schedule a visit  9. Gastroesophageal reflux disease, unspecified whether esophagitis present  Well controlled with medication  10. Stress incontinence of urine  Using 4-5 depends per day, sending order to pharmacy  11. Low serum vitamin B12  - B12 and Folate Panel  12. Gastroesophageal reflux disease without esophagitis  - pantoprazole  (PROTONIX ) 20 MG tablet; Take 1 tablet (20 mg total) by mouth daily.  Dispense: 90 tablet; Refill: 1  13. History of cholecystitis  - ursodiol  (ACTIGALL ) 300 MG capsule; Take 1 capsule (300 mg total) by mouth daily.  Dispense: 90 capsule; Refill: 1

## 2024-06-27 ENCOUNTER — Other Ambulatory Visit: Payer: Self-pay | Admitting: Family Medicine

## 2024-06-27 DIAGNOSIS — C3491 Malignant neoplasm of unspecified part of right bronchus or lung: Secondary | ICD-10-CM

## 2024-06-28 ENCOUNTER — Ambulatory Visit: Payer: Self-pay | Admitting: Family Medicine

## 2024-07-27 ENCOUNTER — Telehealth (INDEPENDENT_AMBULATORY_CARE_PROVIDER_SITE_OTHER): Admitting: Nurse Practitioner

## 2024-07-27 ENCOUNTER — Ambulatory Visit: Payer: Self-pay | Admitting: Nurse Practitioner

## 2024-07-27 ENCOUNTER — Telehealth: Payer: Self-pay

## 2024-07-27 DIAGNOSIS — R8290 Unspecified abnormal findings in urine: Secondary | ICD-10-CM | POA: Diagnosis not present

## 2024-07-27 DIAGNOSIS — R6883 Chills (without fever): Secondary | ICD-10-CM | POA: Diagnosis not present

## 2024-07-27 DIAGNOSIS — R400 Somnolence: Secondary | ICD-10-CM | POA: Diagnosis not present

## 2024-07-27 LAB — POCT URINALYSIS DIPSTICK
Bilirubin, UA: NEGATIVE
Blood, UA: NEGATIVE
Glucose, UA: NEGATIVE
Ketones, UA: NEGATIVE
Leukocytes, UA: NEGATIVE
Nitrite, UA: NEGATIVE
Odor: NORMAL
Protein, UA: POSITIVE — AB
Spec Grav, UA: 1.025 (ref 1.010–1.025)
Urobilinogen, UA: >=8 U/dL — AB
pH, UA: 6.5 (ref 5.0–8.0)

## 2024-07-27 NOTE — Progress Notes (Signed)
 Name: Jose Castro   MRN: [id number]    DOB: 1926-05-14   Date:07/27/2024       Progress Note  Subjective  Chief Complaint  No chief complaint on file.   I connected with  Nancyann JONELLE Motto  on 07/27/24 at  1:00 PM EDT by a video enabled telemedicine application and verified that I am speaking with the correct person using two identifiers.  I discussed the limitations of evaluation and management by telemedicine and the availability of in person appointments. The patient expressed understanding and agreed to proceed with a virtual visit  Staff also discussed with the patient that there may be a patient responsible charge related to this service. Patient Location: home Provider Location: [facility] Additional Individuals present: family  HPI   Discussed the use of AI scribe software for clinical note transcription with the patient, who gave verbal consent to proceed.  History of Present Illness Jose Castro is a 89 year old male with a history of stroke who presents with concerns of a possible urinary tract infection. He is accompanied by his daughter.  Constitutional symptoms - Chills and general malaise began on Sunday after breakfast - Increased somnolence over the past week - No documented fever  Urinary symptoms - Increased urine saturation in bed over the past week - Possible urinary incontinence or increased urine output - Strong odor to urine - No back pain    Patient Active Problem List   Diagnosis Date Noted   Degenerative disease of nervous system, unspecified (HCC) 02/16/2023   Senile purpura (HCC) 02/16/2023   History of cholecystitis 02/16/2023   Low serum vitamin B12 02/16/2023   Gait instability 02/16/2023   History of recent fall 02/16/2023   Atherosclerosis of native arteries of extremity with intermittent claudication (HCC) 11/25/2021   Localized osteoarthritis of knees, bilateral 06/13/2021   Hemiparesis affecting left side as late effect of cerebrovascular  accident (CVA) (HCC) 03/11/2021   Aneurysm, aorta, thoracic (HCC) 12/21/2020   Calculus of bile duct without cholecystitis and without obstruction    Non-small cell carcinoma of lung, right (HCC) 09/07/2020   Calculus of bile duct with cholecystitis without obstruction    Abnormal findings on imaging of biliary tract    Acute cholecystitis 05/02/2020   Chronic venous insufficiency 12/05/2019   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    CKD (chronic kidney disease), stage II    Atrial fibrillation (HCC)    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   Goals of care, counseling/discussion    Palliative care by specialist    DNR (do not resuscitate) discussion    History of CVA (cerebrovascular accident) 05/22/2019   Persistent proteinuria 07/30/2018   Mobitz type 2 second degree heart block 07/13/2018   Chronic kidney disease, stage III (moderate) (HCC) 06/15/2018   Leg pain 05/06/2018   Lymphedema 05/06/2018   Coagulopathy (HCC) 07/07/2017   Lumbar spondylosis 03/10/2017   Elevated uric acid in blood 11/20/2016   BPH (benign prostatic hyperplasia) 05/29/2016   Peripheral vascular disease of lower extremity (HCC) 03/03/2016   Gallstone 12/31/2015   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   Atherosclerosis of aorta (HCC) 11/22/2015   Carotid artery narrowing 11/22/2015   Diverticulosis of colon 11/22/2015   Decreased creatinine clearance 11/22/2015   Arthritis, degenerative 11/22/2015   Lactose intolerance 11/22/2015   Basal cell carcinoma 11/22/2015   Essential hypertension 06/21/2015   Hyperlipemia 06/21/2015   GERD (gastroesophageal reflux disease)  06/21/2015   Calculus of kidney 11/18/2013    Social History   Tobacco Use   Smoking status: Former    Current packs/day: 0.00    Types: Cigarettes    Start date: 10/21/1944    Quit date: 10/21/1960    Years since quitting: 63.8   Smokeless tobacco: Never  Substance Use Topics    Alcohol use: Yes    Alcohol/week: 3.0 standard drinks of alcohol    Types: 3 Standard drinks or equivalent per week    Comment: once a week when he goes out to dinner-socially     Current Outpatient Medications:    acetaminophen  (TYLENOL ) 325 MG tablet, Take 2 tablets (650 mg total) by mouth every 4 (four) hours as needed for mild pain (or temp > 37.5 C (99.5 F))., Disp:  , Rfl:    apixaban  (ELIQUIS ) 2.5 MG TABS tablet, Take 1 tablet (2.5 mg total) by mouth 2 (two) times daily., Disp: 60 tablet, Rfl: 0   Cholecalciferol  (VITAMIN D3) 50 MCG (2000 UT) TABS, Take 2,000 Units by mouth daily., Disp: 30 tablet, Rfl: 0   metoprolol  succinate (TOPROL  XL) 25 MG 24 hr tablet, Take 1 tablet (25 mg total) by mouth daily., Disp: 30 tablet, Rfl: 0   Multiple Vitamins-Minerals (ZINC PO), Take 500 mg by mouth 2 (two) times daily., Disp: , Rfl:    Omega-3 Fatty Acids (FISH OIL) 1000 MG CAPS, Take 1,000 mg by mouth daily. , Disp: , Rfl:    pantoprazole  (PROTONIX ) 20 MG tablet, Take 1 tablet (20 mg total) by mouth daily., Disp: 90 tablet, Rfl: 1   rosuvastatin  (CRESTOR ) 40 MG tablet, Take 1 tablet (40 mg total) by mouth daily at 6 PM., Disp: 90 tablet, Rfl: 1   ursodiol  (ACTIGALL ) 300 MG capsule, Take 1 capsule (300 mg total) by mouth daily., Disp: 90 capsule, Rfl: 1  Allergies  Allergen Reactions   Penicillins Itching and Swelling    Pt reported arm rash and swelling with penicillin administration while he was in the [employer].  However, pt was able to tolerate IV Unasyn  without any problem, so penicillin allergy unlikely to be real or serious.  05/14/2020.    I personally reviewed active problem list, medication list, allergies with the patient/caregiver today.  ROS  Ten systems reviewed and is negative except as mentioned in HPI   Objective  Virtual encounter, vitals not obtained.  There is no height or weight on file to calculate BMI.  Nursing Note and Vital Signs reviewed.  Physical  Exam  Awake, and alert,  history of stroke, family reports patient is weaker/sleepier than normal  Results for orders placed or performed in visit on 07/27/24 (from the past 72 hours)  POCT urinalysis dipstick     Status: Abnormal   Collection Time: 07/27/24  2:06 PM  Result Value Ref Range   Color, UA yellow    Clarity, UA clear    Glucose, UA Negative Negative   Bilirubin, UA neg    Ketones, UA neg    Spec Grav, UA 1.025 1.010 - 1.025   Blood, UA neg    pH, UA 6.5 5.0 - 8.0   Protein, UA Positive (A) Negative   Urobilinogen, UA >=8.0 (A) 0.2 or 1.0 E.U./dL   Nitrite, UA neg    Leukocytes, UA Negative Negative   Appearance clear    Odor normal     Assessment & Plan  Assessment and Plan Assessment & Plan Urinary symptoms with concern for urinary tract  infection Presents with urinary symptoms including heavy saturation of urine in the bed, strong urine odor, and increased sleepiness. Concern for a urinary tract infection (UTI) given these symptoms. No documented fever or back pain. Urine was negative for leukocytes or nitrites, common indicators of a UTI. - Order urine dipstick test  - Advise to pick up the urine collection cup from the clinic window. - Instruct to return the urine sample for analysis. - Recommend patient go to the emergency room.      -Red flags and when to present for emergency care or RTC including fever >101.11F, chest pain, shortness of breath, new/worsening/un-resolving symptoms,  reviewed with patient at time of visit. Follow up and care instructions discussed and provided in AVS. - I discussed the assessment and treatment plan with the patient. The patient was provided an opportunity to ask questions and all were answered. The patient agreed with the plan and demonstrated an understanding of the instructions.  I provided 15 minutes of non-face-to-face time during this encounter.  Mliss JULIANNA Spray, FNP

## 2024-07-27 NOTE — Telephone Encounter (Signed)
 Daughter called to inform that Urine dip was negative for any signs of infection/blood and that julie recommended ER or appt for further evaluation of symptoms.  Daughter states her other sister called and did appointment and that he normally has these symptoms and would let her sister know if she wanted to take him or get an appointment for further testing.  She states he just saw Dr. Glenard and labs were normal.

## 2024-07-29 ENCOUNTER — Ambulatory Visit
Admission: RE | Admit: 2024-07-29 | Discharge: 2024-07-29 | Disposition: A | Discharge status: Home / Self Care | Source: Ambulatory Visit | Attending: Family Medicine | Admitting: Family Medicine

## 2024-07-29 DIAGNOSIS — C3491 Malignant neoplasm of unspecified part of right bronchus or lung: Secondary | ICD-10-CM | POA: Insufficient documentation

## 2024-08-04 ENCOUNTER — Encounter: Payer: Self-pay | Admitting: Family Medicine

## 2024-08-10 ENCOUNTER — Ambulatory Visit: Payer: Self-pay | Admitting: Family Medicine

## 2024-08-10 ENCOUNTER — Telehealth: Payer: Self-pay

## 2024-08-10 DIAGNOSIS — C3491 Malignant neoplasm of unspecified part of right bronchus or lung: Secondary | ICD-10-CM

## 2024-08-10 NOTE — Telephone Encounter (Signed)
 Called daughter back no answer left detailed vm to call back

## 2024-08-10 NOTE — Telephone Encounter (Signed)
 Copied from CRM [id number]. Topic: Clinical - Lab/Test Results >> Aug 10, 2024 11:35 AM Tonda B wrote: Reason for CRM: patient daughter  LORRAINE Granberry calling asking for a call back for clarity on test results   [phone] (H)

## 2024-08-18 ENCOUNTER — Institutional Professional Consult (permissible substitution): Admitting: Radiation Oncology

## 2024-08-18 NOTE — Progress Notes (Signed)
 [employer] [facility] [employer].  See chart review for scanned document of complete results.

## 2024-08-23 ENCOUNTER — Other Ambulatory Visit: Payer: Self-pay | Admitting: *Deleted

## 2024-08-23 ENCOUNTER — Ambulatory Visit
Admission: RE | Admit: 2024-08-23 | Discharge: 2024-08-23 | Disposition: A | Discharge status: Home / Self Care | Source: Ambulatory Visit | Attending: Radiation Oncology | Admitting: Radiation Oncology

## 2024-08-23 ENCOUNTER — Encounter: Payer: Self-pay | Admitting: Radiation Oncology

## 2024-08-23 VITALS — BP 108/58 | HR 64 | Temp 97.5°F | Resp 16 | Wt 157.0 lb

## 2024-08-23 DIAGNOSIS — R918 Other nonspecific abnormal finding of lung field: Secondary | ICD-10-CM | POA: Diagnosis not present

## 2024-08-23 DIAGNOSIS — Z923 Personal history of irradiation: Secondary | ICD-10-CM | POA: Diagnosis not present

## 2024-08-23 DIAGNOSIS — C3411 Malignant neoplasm of upper lobe, right bronchus or lung: Secondary | ICD-10-CM

## 2024-08-23 DIAGNOSIS — Z85118 Personal history of other malignant neoplasm of bronchus and lung: Secondary | ICD-10-CM | POA: Diagnosis present

## 2024-08-23 NOTE — Progress Notes (Signed)
 Radiation Oncology Follow up Note  Name: Jose Castro   Date:   08/23/2024 MRN:  [id number] DOB: 1926/02/26    This 89 y.o. male presents to the clinic today for reevaluation of multiple pulmonary nodules and patient previously treated with SBRT to his right upper lobe for stage I non-small cell lung cancer back in 2021.  REFERRING PROVIDER: Sowles, Krichna, MD  HPI: Patient is a 89 year old male well-known to our department having been treated back in 2021 for stage I non-small cell lung cancer of the right upper lobe with SBRT.  He has been doing well is followed by his family doctor.SABRA  He is fairly asymptomatic specifically denies cough hemoptysis chest tightness or any change in his pulmonary status.  He had a recent CT scan in August showing multiple subsolid nodules in both lungs approximately 15 which substantially increased in size and solid characteristics consistent with progression of multifocal adenocarcinoma.  No evidence of lymphadenopathy or soft tissue metastatic disease in the chest.  I have reviewed his CT scan and none of these lesions are obstructing a major bronchus invading his chest sidewall or producing hemoptysis.  COMPLICATIONS OF TREATMENT: none  FOLLOW UP COMPLIANCE: keeps appointments   PHYSICAL EXAM:  BP (!) 108/58   Pulse 64   Temp (!) 97.5 F (36.4 C)   Resp 16   Wt 157 lb (71.2 kg)   BMI 23.18 kg/m  Elderly frail male in NAD.  Well-developed well-nourished patient in NAD. HEENT reveals PERLA, EOMI, discs not visualized.  Oral cavity is clear. No oral mucosal lesions are identified. Neck is clear without evidence of cervical or supraclavicular adenopathy. Lungs are clear to A&P. Cardiac examination is essentially unremarkable with regular rate and rhythm without murmur rub or thrill. Abdomen is benign with no organomegaly or masses noted. Motor sensory and DTR levels are equal and symmetric in the upper and lower extremities. Cranial nerves II through XII are  grossly intact. Proprioception is intact. No peripheral adenopathy or edema is identified. No motor or sensory levels are noted. Crude visual fields are within normal range.  RADIOLOGY RESULTS: Serial CT scans reviewed compatible with above-stated findings  PLAN: At the present time patient's nodules are too numerous to attempt any radiation therapy.  I would only use radiation therapy should he develop hemoptysis or lesion causing atelectasis of the lung.  That is not the case here.  I am referring him to medical oncology for possible evaluation for immunotherapy.  Patient is again asymptomatic.  I would be happy to reevaluate the patient anytime should palliative treatment be indicated.  I would like to take this opportunity to thank you for allowing me to participate in the care of your patient.SABRA Marcey Penton, MD

## 2024-08-30 ENCOUNTER — Encounter: Payer: Self-pay | Admitting: *Deleted

## 2024-08-30 ENCOUNTER — Encounter: Payer: Self-pay | Admitting: Internal Medicine

## 2024-08-30 ENCOUNTER — Inpatient Hospital Stay

## 2024-08-30 ENCOUNTER — Inpatient Hospital Stay: Attending: Internal Medicine | Admitting: Internal Medicine

## 2024-08-30 VITALS — BP 90/59 | HR 64 | Temp 97.9°F | Resp 16 | Ht 69.0 in | Wt 157.0 lb

## 2024-08-30 DIAGNOSIS — K831 Obstruction of bile duct: Secondary | ICD-10-CM | POA: Diagnosis not present

## 2024-08-30 DIAGNOSIS — Z923 Personal history of irradiation: Secondary | ICD-10-CM | POA: Diagnosis not present

## 2024-08-30 DIAGNOSIS — Z7901 Long term (current) use of anticoagulants: Secondary | ICD-10-CM | POA: Diagnosis not present

## 2024-08-30 DIAGNOSIS — I4891 Unspecified atrial fibrillation: Secondary | ICD-10-CM | POA: Insufficient documentation

## 2024-08-30 DIAGNOSIS — Z8673 Personal history of transient ischemic attack (TIA), and cerebral infarction without residual deficits: Secondary | ICD-10-CM | POA: Diagnosis not present

## 2024-08-30 DIAGNOSIS — Z8052 Family history of malignant neoplasm of bladder: Secondary | ICD-10-CM | POA: Insufficient documentation

## 2024-08-30 DIAGNOSIS — C3411 Malignant neoplasm of upper lobe, right bronchus or lung: Secondary | ICD-10-CM | POA: Insufficient documentation

## 2024-08-30 DIAGNOSIS — Z87891 Personal history of nicotine dependence: Secondary | ICD-10-CM | POA: Diagnosis not present

## 2024-08-30 NOTE — Assessment & Plan Note (Addendum)
#   RUL [~3cm]; SUV 3.5- [slightly growing from [place] 2017]-highly suspicious for malignancy based upon growth; also PET positivity. S/p SBRT [sep25th 2021]  # However surveillance scans-radiation oncology # AUG 2025- Unchanged treated mass and adjacent consolidation of the posterior right upper lobe;  Multiple subsolid nodules in both lungs, approximately 15, have substantially increased in size and solid character, consistent with progression of multifocal adenocarcinom; . No evidence of lymphadenopathy or soft tissue metastatic disease in the chest.  # Patient interestingly is symptomatic.  # I had a long discussion the patient and daughter Norvel regarding limited options patient currently has from systemic therapy standpoint.  Patient given his age at 67 and his preference not a good candidate for any options of biopsy.  Discussed that I would not empirically treat him with systemic therapy either immunotherapy or targeted therapy-without any histologic diagnosis.  Given the limitations-I think it still reasonable to check liquid biopsy-and consider any therapeutic option based upon any actionable mutations.  Also discussed the role for reirradiation if any of the lung nodules are symptomatic.  # Patient and also daughter aware that liquid biopsy could be indeterminate.  Also discussed regarding the natural progression of disease and the symptomatology with progressive lung cancer.  They seem to be very realistic of the limited options.  Await liquid biopsy as of now.   # Biliary obstruction- s/p biliary drain; Defer to Dr.Byrnett; STABLE.   # A.fib/ stroke- s/p PPM- - on Eliquis -STABLE.   Thank you Dr.Chrystal for allowing me to participate in the care of your pleasant patient. Please do not hesitate to contact me with questions or concerns in the interim.  Discussed with [facility]  # DISPOSITION: # [facility] blood test re: lung cancer  # Follow up TBD- Dr.B  # I reviewed the blood work- with  the patient in detail; also reviewed the imaging independently [as summarized above]; and with the patient in detail.

## 2024-08-30 NOTE — Progress Notes (Signed)
 [facility] CONSULT NOTE  Patient Care Team: Glenard Mire, MD as PCP - General (Family Medicine) Kassie Ozell SAUNDERS, MD as Consulting Physician (Urology) Florencio Cara BIRCH, MD as Consulting Physician (Cardiology) Rennie Jose SAUNDERS, MD as Consulting Physician (Internal Medicine) Schnier, Cordella MATSU, MD as Consulting Physician (Vascular Surgery) Lenn Aran, MD as Radiation Oncologist (Radiation Oncology) Verdene Gills, RN as Oncology Nurse Navigator  CHIEF COMPLAINTS/PURPOSE OF CONSULTATION: LUNG cancer   Oncology History  Primary cancer of right upper lobe of lung (HCC)  08/30/2024 Initial Diagnosis   Primary cancer of right upper lobe of lung (HCC)   08/30/2024 Cancer Staging   Staging form: Lung, AJCC V9 - Clinical: Stage IVA (cT4, cN0, cM1a) - Signed by Rennie Jose SAUNDERS, MD on 08/30/2024    # # AUG 2021- RUL lung mass/cancer [inidental;~3cm; PET- SUV- 3.5] [more consolidated from Jan 2017]; no biopsy;  status post SBRT [finished September 2021];   #2021 acute cholecystitis-s/p cholecystostomy drain [Dr. Byrnett]  # June 2020- stroke left side of body;; Aifb [on Eliquis ]  HISTORY OF PRESENTING ILLNESS: Patient ambulating- with assistance- cane.   Accompanied by daughter  Jose Castro 89 y.o.  male history of stroke A-fib of clinical  right upper lobe lung cancer s/p SBRT-in 2021-is here to reestablish care for his worsening scans.  Patient did extremely well as per the family with radiation.  However noted to have worsening disease noted on the CT scan.  Denies any worsening cough.  He denies any worsening shortness of breath.  Review of Systems  Constitutional:  Negative for chills, diaphoresis, fever and malaise/fatigue.  HENT:  Negative for nosebleeds and sore throat.   Eyes:  Negative for double vision.  Respiratory:  Negative for cough, hemoptysis, sputum production, shortness of breath and wheezing.   Cardiovascular:  Negative for chest  pain, palpitations, orthopnea and leg swelling.  Gastrointestinal:  Negative for abdominal pain, blood in stool, constipation, diarrhea, heartburn, melena, nausea and vomiting.  Genitourinary:  Negative for dysuria, frequency and urgency.  Musculoskeletal:  Negative for back pain and joint pain.  Skin: Negative.  Negative for itching and rash.  Neurological:  Negative for dizziness, tingling, focal weakness, weakness and headaches.  Endo/Heme/Allergies:  Does not bruise/bleed easily.  Psychiatric/Behavioral:  Negative for depression. The patient is not nervous/anxious and does not have insomnia.   All other systems reviewed and are negative.    MEDICAL HISTORY:  Past Medical History:  Diagnosis Date   Allergy    Penicillin   Arthritis    Basal cell carcinoma    BPH (benign prostatic hyperplasia)    Chronic kidney disease    had a kidney stone which per family was a cyst that was removed   Dysrhythmia    GERD (gastroesophageal reflux disease)    History of cholecystitis 02/16/2023   History of kidney stones    Hyperlipidemia    Hypertension    Left-sided low back pain with left-sided sciatica    Lung mass    Splenic vein thrombosis    Stroke (HCC) 05/22/2019   Thrombosis 12/2015   mural  area and no notation of heart attack    SURGICAL HISTORY: Past Surgical History:  Procedure Laterality Date   APPENDECTOMY     CATARACT EXTRACTION, BILATERAL     CYSTOSCOPY WITH INSERTION OF UROLIFT     ENDOSCOPIC RETROGRADE CHOLANGIOPANCREATOGRAPHY (ERCP) WITH PROPOFOL  N/A 05/11/2020   Procedure: ENDOSCOPIC RETROGRADE CHOLANGIOPANCREATOGRAPHY (ERCP) WITH PROPOFOL ;  Surgeon: Jinny Carmine, MD;  Location:  [facility] ENDOSCOPY;  Service: Endoscopy;  Laterality: N/A;   ERCP N/A 10/23/2020   Procedure: ENDOSCOPIC RETROGRADE CHOLANGIOPANCREATOGRAPHY (ERCP);  Surgeon: Jinny Carmine, MD;  Location: [facility] ENDOSCOPY;  Service: Endoscopy;  Laterality: N/A;   EYE SURGERY  08/2018   IR EXCHANGE BILIARY DRAIN   05/04/2020   KIDNEY STONE SURGERY     KNEE SURGERY     PACEMAKER INSERTION N/A 07/13/2018   Procedure: INSERTION PACEMAKER-DUEL CHAMBER INITIAL IMPLANT;  Surgeon: Ammon Blunt, MD;  Location: [facility] ORS;  Service: Cardiovascular;  Laterality: N/A;   SPINE SURGERY      SOCIAL HISTORY: Social History   Socioeconomic History   Marital status: Widowed    Spouse name: Not on file   Number of children: 6   Years of education: Not on file   Highest education level: Some college, no degree  Occupational History   Occupation: retired    Associate Professor: [employer]    Comment: worked at a shop  Tobacco Use   Smoking status: Former    Current packs/day: 0.00    Types: Cigarettes    Start date: 10/21/1944    Quit date: 10/21/1960    Years since quitting: 63.9   Smokeless tobacco: Never  Vaping Use   Vaping status: Never Used  Substance and Sexual Activity   Alcohol use: Yes    Alcohol/week: 3.0 standard drinks of alcohol    Types: 3 Standard drinks or equivalent per week    Comment: once a week when he goes out to dinner-socially   Drug use: No   Sexual activity: Not Currently    Partners: Female  Other Topics Concern   Not on file  Social History Narrative   Widow, lives alone but 4 of his children rotate staying with him since July 2020 due to stroke and he is not left alone but does most things independently.       Smoke 15-20 years; quit 60 years ago. Rare alcohol. 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    Social Drivers of Corporate investment banker Strain: Low Risk  (05/29/2022)   Overall Financial Resource Strain (CARDIA)    Difficulty of Paying Living Expenses: Not hard at all  Food Insecurity: No Food Insecurity (08/30/2024)   Hunger Vital Sign    Worried About Running Out of Food in the Last Year: Never true    Ran Out of Food in the Last Year: Never true  Transportation Needs: No Transportation Needs (08/30/2024)   PRAPARE - Administrator, Civil Service (Medical): No    Lack  of Transportation (Non-Medical): No  Physical Activity: Sufficiently Active (05/29/2022)   Exercise Vital Sign    Days of Exercise per Week: 5 days    Minutes of Exercise per Session: 30 min  Stress: No Stress Concern Present (05/29/2022)   [employer] of Occupational Health - Occupational Stress Questionnaire    Feeling of Stress : Not at all  Social Connections: Moderately Integrated (05/29/2022)   Social Connection and Isolation Panel    Frequency of Communication with Friends and Family: More than three times a week    Frequency of Social Gatherings with Friends and Family: More than three times a week    Attends Religious Services: More than 4 times per year    Active Member of [employer] or Organizations: Yes    Attends Banker Meetings: More than 4 times per year    Marital Status: Widowed  Intimate Partner Violence: Not At Risk (08/30/2024)   Humiliation,  Afraid, Rape, and Kick questionnaire    Fear of Current or Ex-Partner: No    Emotionally Abused: No    Physically Abused: No    Sexually Abused: No    FAMILY HISTORY: Family History  Problem Relation Age of Onset   Cancer Brother        bladder cancer with mets   Heart disease Mother    Aortic aneurysm Mother    Heart attack Maternal Aunt    Heart attack Maternal Uncle     ALLERGIES:  is allergic to penicillins.  MEDICATIONS:  Current Outpatient Medications  Medication Sig Dispense Refill   acetaminophen  (TYLENOL ) 325 MG tablet Take 2 tablets (650 mg total) by mouth every 4 (four) hours as needed for mild pain (or temp > 37.5 C (99.5 F)).     apixaban  (ELIQUIS ) 2.5 MG TABS tablet Take 1 tablet (2.5 mg total) by mouth 2 (two) times daily. 60 tablet 0   Cholecalciferol  (VITAMIN D3) 50 MCG (2000 UT) TABS Take 2,000 Units by mouth daily. 30 tablet 0   metoprolol  succinate (TOPROL  XL) 25 MG 24 hr tablet Take 1 tablet (25 mg total) by mouth daily. 30 tablet 0   Multiple Vitamins-Minerals (ZINC PO) Take 500 mg  by mouth 2 (two) times daily.     Omega-3 Fatty Acids (FISH OIL) 1000 MG CAPS Take 1,000 mg by mouth daily.      pantoprazole  (PROTONIX ) 20 MG tablet Take 1 tablet (20 mg total) by mouth daily. 90 tablet 1   rosuvastatin  (CRESTOR ) 40 MG tablet Take 1 tablet (40 mg total) by mouth daily at 6 PM. 90 tablet 1   ursodiol  (ACTIGALL ) 300 MG capsule Take 1 capsule (300 mg total) by mouth daily. 90 capsule 1   No current facility-administered medications for this visit.      SABRA  PHYSICAL EXAMINATION: ECOG PERFORMANCE STATUS: 0 - Asymptomatic  Vitals:   08/30/24 1343  BP: (!) 90/59  Pulse: 64  Resp: 16  Temp: 97.9 F (36.6 C)  SpO2: 95%   Filed Weights   08/30/24 1343  Weight: 157 lb (71.2 kg)    Physical Exam Constitutional:      Comments: Is walking with a walker.  Accompanied by his daughter.  HENT:     Head: Normocephalic and atraumatic.     Mouth/Throat:     Pharynx: No oropharyngeal exudate.  Eyes:     Pupils: Pupils are equal, round, and reactive to light.  Cardiovascular:     Rate and Rhythm: Normal rate and regular rhythm.  Pulmonary:     Effort: Pulmonary effort is normal. No respiratory distress.     Breath sounds: Normal breath sounds. No wheezing.  Abdominal:     General: Bowel sounds are normal. There is no distension.     Palpations: Abdomen is soft. There is no mass.     Tenderness: There is no abdominal tenderness. There is no guarding or rebound.     Comments: Drain in place.  Musculoskeletal:        General: No tenderness. Normal range of motion.     Cervical back: Normal range of motion and neck supple.  Skin:    General: Skin is warm.  Neurological:     Mental Status: He is alert and oriented to person, place, and time.  Psychiatric:        Mood and Affect: Affect normal.      LABORATORY DATA:  I have reviewed the data as listed Lab  Results  Component Value Date   WBC 8.5 06/24/2024   HGB 14.8 06/24/2024   HCT 45.1 06/24/2024   MCV 98.0  06/24/2024   PLT 256 06/24/2024   Recent Labs    12/21/23 1436 06/24/24 1337  NA 140 141  K 4.9 4.4  CL 104 104  CO2 31 28  GLUCOSE 67 105*  BUN 23 18  CREATININE 1.18 1.20  CALCIUM  9.8 9.5  PROT 6.5 6.3  AST 14 12  ALT 7* 6*  BILITOT 0.9 1.0    RADIOGRAPHIC STUDIES: I have personally reviewed the radiological images as listed and agreed with the findings in the report. No results found.  ASSESSMENT & PLAN:   Primary cancer of right upper lobe of lung (HCC) # RUL [~3cm]; SUV 3.5- [slightly growing from [place] 2017]-highly suspicious for malignancy based upon growth; also PET positivity. S/p SBRT [sep25th 2021]  # However surveillance scans-radiation oncology # AUG 2025- Unchanged treated mass and adjacent consolidation of the posterior right upper lobe;  Multiple subsolid nodules in both lungs, approximately 15, have substantially increased in size and solid character, consistent with progression of multifocal adenocarcinom; . No evidence of lymphadenopathy or soft tissue metastatic disease in the chest.  # Patient interestingly is symptomatic.  # I had a long discussion the patient and daughter Jose Castro regarding limited options patient currently has from systemic therapy standpoint.  Patient given his age at 41 and his preference not a good candidate for any options of biopsy.  Discussed that I would not empirically treat him with systemic therapy either immunotherapy or targeted therapy-without any histologic diagnosis.  Given the limitations-I think it still reasonable to check liquid biopsy-and consider any therapeutic option based upon any actionable mutations.  Also discussed the role for reirradiation if any of the lung nodules are symptomatic.  # Patient and also daughter aware that liquid biopsy could be indeterminate.  Also discussed regarding the natural progression of disease and the symptomatology with progressive lung cancer.  They seem to be very realistic of the  limited options.  Await liquid biopsy as of now.   # Biliary obstruction- s/p biliary drain; Defer to Dr.Byrnett; STABLE.   # A.fib/ stroke- s/p PPM- - on Eliquis -STABLE.   Thank you Dr.Chrystal for allowing me to participate in the care of your pleasant patient. Please do not hesitate to contact me with questions or concerns in the interim.  Discussed with [facility].  # DISPOSITION: # [facility] blood test re: lung cancer  # Follow up TBD- Dr.B  # I reviewed the blood work- with the patient in detail; also reviewed the imaging independently [as summarized above]; and with the patient in detail.    All questions were answered. The patient knows to call the clinic with any problems, questions or concerns.    Jose JONELLE Joe, MD 08/30/2024 3:41 PM

## 2024-08-30 NOTE — Progress Notes (Signed)
 Blood sample collected for [facility] xF+ today. Order placed for xF+. Financial assistance application completed and approved for $0 out of pocket cost.

## 2024-08-30 NOTE — Progress Notes (Signed)
 Met with patient and his daughter during follow up visit with Dr. Rennie. All questions answered during visit. Informed daughter that will be called with results for liquid biopsy once reported and schedule further follow up based on results. Contact info given and instructed to call with any questions or needs. Pt and daughter verbalized understanding.

## 2024-08-30 NOTE — Progress Notes (Signed)
 Daughter asking what options are at his age?

## 2024-09-13 ENCOUNTER — Encounter: Payer: Self-pay | Admitting: Internal Medicine

## 2024-09-14 ENCOUNTER — Telehealth: Payer: Self-pay | Admitting: Internal Medicine

## 2024-09-14 NOTE — Telephone Encounter (Signed)
 Dr. Melanee reviewed liquid biopsy results and unfortunately there are no targets identified for systemic treatment. Per Dr. Damaris last note, pt will need to further discuss with Dr. Lenn if radiation is an option to treat enlarging lung masses. Message sent to radonc to schedule follow up with Dr. Lenn and will notify pt's daughter with appt once scheduled.

## 2024-09-14 NOTE — Telephone Encounter (Signed)
 Pt daughter called and stated the lab work is back and they need to schedule appts for pt.  I told pt daughter that since the last appt was TBD we will have to have orders from the provider and then we can schedule appts.    Pt daughter would like to be called with the appts.

## 2024-09-22 ENCOUNTER — Other Ambulatory Visit: Payer: Self-pay | Admitting: *Deleted

## 2024-09-22 ENCOUNTER — Encounter: Payer: Self-pay | Admitting: Radiation Oncology

## 2024-09-22 ENCOUNTER — Ambulatory Visit
Admission: RE | Admit: 2024-09-22 | Discharge: 2024-09-22 | Disposition: A | Discharge status: Home / Self Care | Source: Ambulatory Visit | Attending: Radiation Oncology | Admitting: Radiation Oncology

## 2024-09-22 VITALS — BP 117/74 | HR 80 | Temp 98.0°F | Resp 15 | Ht 69.0 in | Wt 153.7 lb

## 2024-09-22 DIAGNOSIS — Z923 Personal history of irradiation: Secondary | ICD-10-CM | POA: Insufficient documentation

## 2024-09-22 DIAGNOSIS — Z85118 Personal history of other malignant neoplasm of bronchus and lung: Secondary | ICD-10-CM

## 2024-09-22 NOTE — Progress Notes (Signed)
 Radiation Oncology Follow up Note  Name: Jose Castro   Date:   09/22/2024 MRN:  [id number] DOB: [birth year]    This 89 y.o. male presents to the clinic today for reevaluation of patient with multiple lung lesions and patient treated back in 2021 with SBRT to his right lower upper lobe for stage I non-small cell lung cancer.  REFERRING PROVIDER: Sowles, Krichna, MD  HPI: Patient is a 89 year old male well-known to department having received SBRT to his right upper lobe for stage I non-small cell lung cancer back in 2021.  He has gone on to present with.  Approximately 15 lesions substantially increased in size and solid character consistent with progression of multifocal adenocarcinoma.  No evidence of lymphadenopathy or soft tissue metastatic disease in the chest.  He is asymptomatic specifically [place] cough hemoptysis chest tightness.  They attempted a liquid biopsy to possibly treat him with immunotherapy although liquid biopsy was negative.  There were no actionable mutations.  He is referred back to me for consideration of any possible further treatment to the chest.  COMPLICATIONS OF TREATMENT: none  FOLLOW UP COMPLIANCE: keeps appointments   PHYSICAL EXAM:  BP 117/74   Pulse 80   Temp 98 F (36.7 C)   Resp 15   Ht 5' 9 (1.753 m)   Wt 153 lb 11.2 oz (69.7 kg)   BMI 22.70 kg/m  Elderly frail male in NAD.  Well-developed well-nourished patient in NAD. HEENT reveals PERLA, EOMI, discs not visualized.  Oral cavity is clear. No oral mucosal lesions are identified. Neck is clear without evidence of cervical or supraclavicular adenopathy. Lungs are clear to A&P. Cardiac examination is essentially unremarkable with regular rate and rhythm without murmur rub or thrill. Abdomen is benign with no organomegaly or masses noted. Motor sensory and DTR levels are equal and symmetric in the upper and lower extremities. Cranial nerves II through XII are grossly intact. Proprioception is intact. No  peripheral adenopathy or edema is identified. No motor or sensory levels are noted. Crude visual fields are within normal range.  RADIOLOGY RESULTS: CT scan reviewed compatible with above-stated findings.  PET CT scan ordered  PLAN: This time of ordered a PET CT scan a few weeks down the road and I will review that and see him back in follow-up shortly thereafter.  If there is any lesions that are potentially problematic i.e. causing possible further atelectasis or hemoptysis would offer palliative treatment.  Patient and his daughter both comprehend my recommendations well.  PET scan and follow-up were scheduled.  I would like to take this opportunity to thank you for allowing me to participate in the care of your patient.SABRA Marcey Penton, MD

## 2024-10-12 LAB — MISCELLANEOUS TEST

## 2024-10-21 ENCOUNTER — Ambulatory Visit
Admission: RE | Admit: 2024-10-21 | Discharge: 2024-10-21 | Disposition: A | Discharge status: Home / Self Care | Source: Ambulatory Visit | Attending: Radiation Oncology | Admitting: Radiation Oncology

## 2024-10-21 DIAGNOSIS — K8020 Calculus of gallbladder without cholecystitis without obstruction: Secondary | ICD-10-CM | POA: Insufficient documentation

## 2024-10-21 DIAGNOSIS — C7802 Secondary malignant neoplasm of left lung: Secondary | ICD-10-CM | POA: Diagnosis not present

## 2024-10-21 DIAGNOSIS — I2510 Atherosclerotic heart disease of native coronary artery without angina pectoris: Secondary | ICD-10-CM | POA: Insufficient documentation

## 2024-10-21 DIAGNOSIS — I700 Atherosclerosis of aorta: Secondary | ICD-10-CM | POA: Insufficient documentation

## 2024-10-21 DIAGNOSIS — N200 Calculus of kidney: Secondary | ICD-10-CM | POA: Insufficient documentation

## 2024-10-21 DIAGNOSIS — Z85118 Personal history of other malignant neoplasm of bronchus and lung: Secondary | ICD-10-CM

## 2024-10-21 DIAGNOSIS — C3411 Malignant neoplasm of upper lobe, right bronchus or lung: Secondary | ICD-10-CM | POA: Insufficient documentation

## 2024-10-21 DIAGNOSIS — R918 Other nonspecific abnormal finding of lung field: Secondary | ICD-10-CM | POA: Diagnosis present

## 2024-10-21 DIAGNOSIS — N400 Enlarged prostate without lower urinary tract symptoms: Secondary | ICD-10-CM | POA: Diagnosis not present

## 2024-10-21 LAB — GLUCOSE, CAPILLARY: Glucose-Capillary: 76 mg/dL (ref 70–99)

## 2024-10-21 MED ORDER — FLUDEOXYGLUCOSE F - 18 (FDG) INJECTION
8.5700 | Freq: Once | INTRAVENOUS | Status: AC | PRN
  Administered 2024-10-21: 8.57 via INTRAVENOUS

## 2024-11-17 ENCOUNTER — Other Ambulatory Visit (INDEPENDENT_AMBULATORY_CARE_PROVIDER_SITE_OTHER): Payer: Medicare Other

## 2024-11-17 ENCOUNTER — Ambulatory Visit (INDEPENDENT_AMBULATORY_CARE_PROVIDER_SITE_OTHER): Payer: Medicare Other | Admitting: Vascular Surgery

## 2024-11-17 ENCOUNTER — Encounter (INDEPENDENT_AMBULATORY_CARE_PROVIDER_SITE_OTHER): Payer: Self-pay | Admitting: Vascular Surgery

## 2024-11-17 VITALS — BP 122/68 | HR 68 | Resp 16 | Ht 70.0 in | Wt 150.0 lb

## 2024-11-17 DIAGNOSIS — I6523 Occlusion and stenosis of bilateral carotid arteries: Secondary | ICD-10-CM | POA: Diagnosis not present

## 2024-11-17 DIAGNOSIS — I482 Chronic atrial fibrillation, unspecified: Secondary | ICD-10-CM

## 2024-11-17 DIAGNOSIS — I10 Essential (primary) hypertension: Secondary | ICD-10-CM | POA: Diagnosis not present

## 2024-11-17 DIAGNOSIS — I70213 Atherosclerosis of native arteries of extremities with intermittent claudication, bilateral legs: Secondary | ICD-10-CM

## 2024-11-17 DIAGNOSIS — E782 Mixed hyperlipidemia: Secondary | ICD-10-CM

## 2024-11-17 NOTE — Progress Notes (Signed)
 MRN : [id number]  Jose Castro is a 89 y.o. (1926-01-03) male who presents with chief complaint of check circulation.  History of Present Illness:   The patient returns to the office for followup and review of the noninvasive studies.    There have been no interval changes in lower extremity symptoms. No interval shortening of the patient's claudication distance or development of rest pain symptoms. No new ulcers or wounds have occurred since the last visit.   The patient is followed for leg right lower extremity numbness.  The original complaint was of numbness in his right foot.  The patient notes that the numbness happens more so at night and it is in his toes and foot area.  He notes that this pain comes and goes.  He notes that if he stands or walks or moves his foot around the pain tends to go away.  This pain does not happen consistently.  He denies any claudication or rest pain like symptoms.  The patient has had a previous history of a spinal fusion about 7 to 8 years ago.     The patient denies amaurosis fugax or recent TIA symptoms. There are no documented recent neurological changes noted.   There is no history of DVT, PE or superficial thrombophlebitis. The patient denies recent episodes of angina or shortness of breath.    He follows with Dr. Camelia and is s/p radiation recently for a nodule in his lung.  He denies any fever, chills, nausea or vomiting.  He denies any hemoptysis.  Follow up scans are stable   ABI's Rt=1.08 and Lt=0.78(previous ABI Rt=1.15 and Lt=0.92).   Previous carotid duplex dated 11/17/2022 shows RICA Occluded (known) and LICA 1-39% (previous carotid duplex done at [facility] dated 05/22/2019 showed RICA occlusion and LICA 1-39%)   CT scan of the chest dated 02/19/2023 reviewed by me demonstrates an ascending aortic aneurysm measuring 4.1 cm.  The descending thoracic aorta is ectatic  measuring 3.0 of note it is somewhat thickened suggesting some inflammatory changes.  Active Medications[1]  Past Medical History:  Diagnosis Date   Allergy    Penicillin   Arthritis    Basal cell carcinoma    BPH (benign prostatic hyperplasia)    Chronic kidney disease    had a kidney stone which per family was a cyst that was removed   Dysrhythmia    GERD (gastroesophageal reflux disease)    History of cholecystitis 02/16/2023   History of kidney stones    Hyperlipidemia    Hypertension    Left-sided low back pain with left-sided sciatica    Lung mass    Splenic vein thrombosis    Stroke (HCC) 05/22/2019   Thrombosis 12/2015   mural  area and no notation of heart attack    Past Surgical History:  Procedure Laterality Date   APPENDECTOMY     CATARACT EXTRACTION, BILATERAL     CYSTOSCOPY WITH INSERTION OF UROLIFT     ENDOSCOPIC RETROGRADE CHOLANGIOPANCREATOGRAPHY (ERCP) WITH PROPOFOL  N/A 05/11/2020   Procedure: ENDOSCOPIC RETROGRADE CHOLANGIOPANCREATOGRAPHY (ERCP) WITH PROPOFOL ;  Surgeon: Jinny Carmine,  MD;  Location: [facility] ENDOSCOPY;  Service: Endoscopy;  Laterality: N/A;   ERCP N/A 10/23/2020   Procedure: ENDOSCOPIC RETROGRADE CHOLANGIOPANCREATOGRAPHY (ERCP);  Surgeon: Jinny Carmine, MD;  Location: [facility] ENDOSCOPY;  Service: Endoscopy;  Laterality: N/A;   EYE SURGERY  08/2018   IR EXCHANGE BILIARY DRAIN  05/04/2020   KIDNEY STONE SURGERY     KNEE SURGERY     PACEMAKER INSERTION N/A 07/13/2018   Procedure: INSERTION PACEMAKER-DUEL CHAMBER INITIAL IMPLANT;  Surgeon: Ammon Blunt, MD;  Location: [facility] ORS;  Service: Cardiovascular;  Laterality: N/A;   SPINE SURGERY      Social History Social History[2]  Family History Family History  Problem Relation Age of Onset   Cancer Brother        bladder cancer with mets   Heart disease Mother    Aortic aneurysm Mother    Heart attack Maternal Aunt    Heart attack Maternal Uncle     Allergies[3]   REVIEW OF SYSTEMS  (Negative unless checked)  Constitutional: [] Weight loss  [] Fever  [] Chills Cardiac: [] Chest pain   [] Chest pressure   [] Palpitations   [] Shortness of breath when laying flat   [] Shortness of breath with exertion. Vascular:  [x] Pain in legs with walking   [] Pain in legs at rest  [] History of DVT   [] Phlebitis   [] Swelling in legs   [] Varicose veins   [] Non-healing ulcers Pulmonary:   [] Uses home oxygen   [] Productive cough   [] Hemoptysis   [] Wheeze  [] COPD   [] Asthma Neurologic:  [] Dizziness   [] Seizures   [] History of stroke   [] History of TIA  [] Aphasia   [] Vissual changes   [] Weakness or numbness in arm   [] Weakness or numbness in leg Musculoskeletal:   [] Joint swelling   [] Joint pain   [] Low back pain Hematologic:  [] Easy bruising  [] Easy bleeding   [] Hypercoagulable state   [] Anemic Gastrointestinal:  [] Diarrhea   [] Vomiting  [] Gastroesophageal reflux/heartburn   [] Difficulty swallowing. Genitourinary:  [] Chronic kidney disease   [] Difficult urination  [] Frequent urination   [] Blood in urine Skin:  [] Rashes   [] Ulcers  Psychological:  [] History of anxiety   []  History of major depression.  Physical Examination  Vitals:   11/17/24 1437  BP: 122/68  Pulse: 68  Resp: 16  Weight: 150 lb (68 kg)  Height: 5' 10 (1.778 m)   Body mass index is 21.52 kg/m. Gen: WD/WN, NAD Head: [place]/AT, No temporalis wasting.  Ear/Nose/Throat: Hearing grossly intact, nares w/o erythema or drainage Eyes: PER, EOMI, sclera nonicteric.  Neck: Supple, no masses.  No bruit or JVD.  Pulmonary:  Good air movement, no audible wheezing, no use of accessory muscles.  Cardiac: RRR, normal S1, S2, no Murmurs. Vascular:  mild trophic changes, no open wounds Vessel Right Left  Radial Palpable Palpable  PT Not Palpable Not Palpable  DP Not Palpable Not Palpable  Gastrointestinal: soft, non-distended. No guarding/no peritoneal signs.  Musculoskeletal: M/S 5/5 throughout.  No visible deformity.  Neurologic: CN 2-12  intact. Pain and light touch intact in extremities.  Symmetrical.  Speech is fluent. Motor exam as listed above. Psychiatric: Judgment intact, Mood & affect appropriate for pt's clinical situation. Dermatologic: No rashes or ulcers noted.  No changes consistent with cellulitis.   CBC Lab Results  Component Value Date   WBC 8.5 06/24/2024   HGB 14.8 06/24/2024   HCT 45.1 06/24/2024   MCV 98.0 06/24/2024   PLT 256 06/24/2024    BMET    Component Value  Date/Time   NA 141 06/24/2024 1337   NA 144 11/19/2016 0955   NA 138 11/27/2013 0819   K 4.4 06/24/2024 1337   K 4.1 11/27/2013 0819   CL 104 06/24/2024 1337   CL 107 11/27/2013 0819   CO2 28 06/24/2024 1337   CO2 23 11/27/2013 0819   GLUCOSE 105 (H) 06/24/2024 1337   GLUCOSE 101 (H) 11/27/2013 0819   BUN 18 06/24/2024 1337   BUN 25 11/19/2016 0955   BUN 22 (H) 11/27/2013 0819   CREATININE 1.20 06/24/2024 1337   CALCIUM  9.5 06/24/2024 1337   CALCIUM  8.2 (L) 11/27/2013 0819   GFRNONAA 54 (L) 06/25/2021 1017   GFRNONAA 53 (L) 03/11/2021 1419   GFRAA 62 03/11/2021 1419   CrCl cannot be calculated (Patient's most recent lab result is older than the maximum 21 days allowed.).  COAG Lab Results  Component Value Date   INR 2.0 (H) 05/02/2020   INR 1.1 05/21/2019   INR 1.04 07/06/2018    Radiology NM PET Image Restag (PS) Skull Base To Thigh Result Date: 10/28/2024 CLINICAL DATA:  Subsequent treatment strategy for lung cancer. EXAM: NUCLEAR MEDICINE PET SKULL BASE TO THIGH TECHNIQUE: 8.6 mCi F-18 FDG was injected intravenously. Full-ring PET imaging was performed from the skull base to thigh after the radiotracer. CT data was obtained and used for attenuation correction and anatomic localization. Fasting blood glucose: 70 mg/dl COMPARISON:  CT chest 91/70/7974, PET 09/25/2021. FINDINGS: Mediastinal blood pool activity: SUV max 2.3 Liver activity: SUV max NA NECK: No abnormal hypermetabolism. Incidental CT findings: Cerebral  atrophy with encephalomalacia in the right temporal lobe. CHEST: Hypermetabolic mass in the posterior right upper lobe measures 3.0 x 3.3 cm (6/47), SUV max 11.2. Additional hypermetabolic bilateral pulmonary nodules. No definitive hypermetabolic lymph nodes. Incidental CT findings: Atherosclerotic calcification of the aorta, aortic valve and coronary arteries. Enlarged pulmonic trunk and heart. No pericardial effusion. Trace right pleural fluid. ABDOMEN/PELVIS: No abnormal hypermetabolism. Incidental CT findings: Gallstones. Low and high attenuation lesions in the kidneys. No specific follow-up necessary. Left renal stone. Prostate is enlarged. Bladder diverticula. SKELETON: No abnormal hypermetabolism. Incidental CT findings: Degenerative changes in the spine. IMPRESSION: 1. 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 2. Trace right pleural fluid. 3. Cholelithiasis. 4. Left renal stone. 5. Enlarged prostate. 6. Aortic atherosclerosis (ICD10-I70.0). Coronary artery calcification. 7. Enlarged pulmonic trunk, indicative of pulmonary arterial hypertension. Electronically Signed   By: Newell Eke M.D.   On: 10/28/2024 09:44     Assessment/Plan 1. Atherosclerosis of native artery of both lower extremities with intermittent claudication (Primary)  Recommend:  The patient has evidence of atherosclerosis of the lower extremities with claudication.  The patient does not voice lifestyle limiting changes at this point in time.  Noninvasive studies do not suggest clinically significant change.  No invasive studies, angiography or surgery at this time The patient should continue walking and begin a more formal exercise program.  The patient should continue antiplatelet therapy and aggressive treatment of the lipid abnormalities  No changes in the patient's medications at this time  Continued surveillance is  indicated as atherosclerosis is likely to progress with time.    The patient will follow-up as needed.  2. Bilateral carotid artery stenosis Recommend:  Given the patient's asymptomatic subcritical stenosis no further invasive testing or surgery at this time.  Duplex ultrasound shows <50% stenosis bilaterally which has been unchanged when compared to the previous studies.  Continue antiplatelet therapy as prescribed Continue management of CAD, HTN and Hyperlipidemia Healthy heart diet,  encouraged exercise at least 4 times per week  Given the stable <50% bilateral carotid stenosis in association with the patient's comorbidities and age the patient will follow up PRN.  The patient is told that if symptoms of a TIA should occur then he should go to the ER and I should be notified, as this would change the management course.  The patient/family voices understanding.  3. Chronic atrial fibrillation (HCC) Continue antiarrhythmia medications as already ordered, these medications have been reviewed and there are no changes at this time.  Continue anticoagulation as ordered by Cardiology Service  4. Essential hypertension Continue antihypertensive medications as already ordered, these medications have been reviewed and there are no changes at this time.  5. Mixed hyperlipidemia Continue statin as ordered and reviewed, no changes at this timeTHEORA Cordella Shawl, MD  11/17/2024 2:42 PM      [1]  Current Meds  Medication Sig   acetaminophen  (TYLENOL ) 325 MG tablet Take 2 tablets (650 mg total) by mouth every 4 (four) hours as needed for mild pain (or temp > 37.5 C (99.5 F)).   apixaban  (ELIQUIS ) 2.5 MG TABS tablet Take 1 tablet (2.5 mg total) by mouth 2 (two) times daily.   Cholecalciferol  (VITAMIN D3) 50 MCG (2000 UT) TABS Take 2,000 Units by mouth daily.   metoprolol  succinate (TOPROL  XL) 25 MG 24 hr tablet Take 1 tablet (25 mg total) by mouth daily.   Multiple Vitamins-Minerals  (ZINC PO) Take 500 mg by mouth 2 (two) times daily.   Omega-3 Fatty Acids (FISH OIL) 1000 MG CAPS Take 1,000 mg by mouth daily.    pantoprazole  (PROTONIX ) 20 MG tablet Take 1 tablet (20 mg total) by mouth daily.   rosuvastatin  (CRESTOR ) 40 MG tablet Take 1 tablet (40 mg total) by mouth daily at 6 PM.   ursodiol  (ACTIGALL ) 300 MG capsule Take 1 capsule (300 mg total) by mouth daily.  [2]  Social History Tobacco Use   Smoking status: Former    Current packs/day: 0.00    Types: Cigarettes    Start date: 10/21/1944    Quit date: 10/21/1960    Years since quitting: 64.1   Smokeless tobacco: Never  Vaping Use   Vaping status: Never Used  Substance Use Topics   Alcohol use: Yes    Alcohol/week: 3.0 standard drinks of alcohol    Types: 3 Standard drinks or equivalent per week    Comment: once a week when he goes out to dinner-socially   Drug use: No  [3]  Allergies Allergen Reactions   Penicillins Itching and Swelling    Pt reported arm rash and swelling with penicillin administration while he was in the [employer].  However, pt was able to tolerate IV Unasyn  without any problem, so penicillin allergy unlikely to be real or serious.  05/14/2020.

## 2024-11-21 LAB — VAS US ABI WITH/WO TBI
Left ABI: 0.78
Right ABI: 1.08

## 2024-11-22 ENCOUNTER — Encounter (INDEPENDENT_AMBULATORY_CARE_PROVIDER_SITE_OTHER): Payer: Self-pay | Admitting: Vascular Surgery

## 2025-01-02 ENCOUNTER — Other Ambulatory Visit: Payer: Self-pay | Admitting: Family Medicine

## 2025-01-02 DIAGNOSIS — K219 Gastro-esophageal reflux disease without esophagitis: Secondary | ICD-10-CM

## 2025-01-02 DIAGNOSIS — Z8719 Personal history of other diseases of the digestive system: Secondary | ICD-10-CM

## 2025-01-04 NOTE — Telephone Encounter (Signed)
 Requested Prescriptions  Pending Prescriptions Disp Refills   ursodiol  (ACTIGALL ) 300 MG capsule [Pharmacy Med Name: URSODIOL  300 MG CAP] 90 capsule 0    Sig: TAKE 1 CAPSULE BY MOUTH DAILY     Gastroenterology:  Antigallstone Agents Failed - 01/04/2025 10:32 AM      Failed - ALT in normal range and within 180 days    ALT  Date Value Ref Range Status  06/24/2024 6 (L) 9 - 46 U/L Final   SGPT (ALT)  Date Value Ref Range Status  11/26/2013 18 12 - 78 U/L Final         Failed - AST in normal range and within 180 days    AST  Date Value Ref Range Status  06/24/2024 12 10 - 35 U/L Final   SGOT(AST)  Date Value Ref Range Status  11/26/2013 21 15 - 37 Unit/L Final         Passed - Valid encounter within last 12 months    Recent Outpatient Visits           5 months ago Abnormal urine odor   [facility] Gareth Clarity F, FNP   6 months ago Chronic atrial fibrillation [facility])   [facility] Sowles, Krichna, MD               pantoprazole  (PROTONIX ) 20 MG tablet [Pharmacy Med Name: PANTOPRAZOLE  SODIUM 20 MG DR TAB] 90 tablet 0    Sig: TAKE ONE TABLET BY MOUTH DAILY     Gastroenterology: Proton Pump Inhibitors Passed - 01/04/2025 10:32 AM      Passed - Valid encounter within last 12 months    Recent Outpatient Visits           5 months ago Abnormal urine odor   [facility] Gareth Clarity F, FNP   6 months ago Chronic atrial fibrillation [facility])   [facility] Sowles, Krichna, MD
